# Patient Record
Sex: Male | Born: 1970
Health system: Southern US, Community
[De-identification: ages and names within clinical notes are randomized; demographics above are authoritative.]

## PROBLEM LIST (undated history)

## (undated) ENCOUNTER — Emergency Department (HOSPITAL_COMMUNITY): Admission: EM | Discharge status: Absent without leave | Payer: Self-pay

## (undated) DIAGNOSIS — J449 Chronic obstructive pulmonary disease, unspecified: Secondary | ICD-10-CM

## (undated) DIAGNOSIS — K760 Fatty (change of) liver, not elsewhere classified: Secondary | ICD-10-CM

## (undated) DIAGNOSIS — I5031 Acute diastolic (congestive) heart failure: Secondary | ICD-10-CM

## (undated) DIAGNOSIS — M109 Gout, unspecified: Secondary | ICD-10-CM

## (undated) DIAGNOSIS — E669 Obesity, unspecified: Secondary | ICD-10-CM

## (undated) DIAGNOSIS — I10 Essential (primary) hypertension: Secondary | ICD-10-CM

## (undated) DIAGNOSIS — D509 Iron deficiency anemia, unspecified: Secondary | ICD-10-CM

## (undated) DIAGNOSIS — E785 Hyperlipidemia, unspecified: Secondary | ICD-10-CM

## (undated) DIAGNOSIS — R0600 Dyspnea, unspecified: Secondary | ICD-10-CM

## (undated) DIAGNOSIS — N189 Chronic kidney disease, unspecified: Secondary | ICD-10-CM

## (undated) HISTORY — PX: VASCULAR SURGERY: SHX849

## (undated) HISTORY — PX: NO PAST SURGERIES: SHX2092

---

## 2001-11-28 ENCOUNTER — Emergency Department (HOSPITAL_COMMUNITY): Admission: EM | Admit: 2001-11-28 | Discharge: 2001-11-28 | Payer: Self-pay | Admitting: *Deleted

## 2002-09-18 ENCOUNTER — Encounter: Payer: Self-pay | Admitting: *Deleted

## 2002-09-18 ENCOUNTER — Emergency Department (HOSPITAL_COMMUNITY): Admission: EM | Admit: 2002-09-18 | Discharge: 2002-09-18 | Payer: Self-pay | Admitting: *Deleted

## 2004-04-19 ENCOUNTER — Emergency Department (HOSPITAL_COMMUNITY): Admission: EM | Admit: 2004-04-19 | Discharge: 2004-04-19 | Payer: Self-pay | Admitting: Emergency Medicine

## 2006-07-14 ENCOUNTER — Encounter (HOSPITAL_COMMUNITY): Admission: RE | Admit: 2006-07-14 | Discharge: 2006-07-25 | Payer: Self-pay | Admitting: Family Medicine

## 2007-01-14 ENCOUNTER — Ambulatory Visit: Payer: Self-pay | Admitting: Cardiology

## 2011-09-11 DIAGNOSIS — R0602 Shortness of breath: Secondary | ICD-10-CM

## 2012-06-07 DIAGNOSIS — R079 Chest pain, unspecified: Secondary | ICD-10-CM

## 2012-06-11 ENCOUNTER — Other Ambulatory Visit: Payer: Self-pay

## 2012-06-11 ENCOUNTER — Emergency Department (HOSPITAL_COMMUNITY): Payer: Medicaid Other

## 2012-06-11 ENCOUNTER — Inpatient Hospital Stay (HOSPITAL_COMMUNITY)
Admission: EM | Admit: 2012-06-11 | Discharge: 2012-06-12 | DRG: 292 | Disposition: A | Discharge status: Home / Self Care | Payer: Medicaid Other | Attending: Internal Medicine | Admitting: Internal Medicine

## 2012-06-11 ENCOUNTER — Encounter (HOSPITAL_COMMUNITY): Payer: Self-pay

## 2012-06-11 ENCOUNTER — Inpatient Hospital Stay (HOSPITAL_COMMUNITY): Payer: Medicaid Other

## 2012-06-11 DIAGNOSIS — R7989 Other specified abnormal findings of blood chemistry: Secondary | ICD-10-CM | POA: Diagnosis present

## 2012-06-11 DIAGNOSIS — E669 Obesity, unspecified: Secondary | ICD-10-CM | POA: Insufficient documentation

## 2012-06-11 DIAGNOSIS — R7401 Elevation of levels of liver transaminase levels: Secondary | ICD-10-CM | POA: Diagnosis present

## 2012-06-11 DIAGNOSIS — I5031 Acute diastolic (congestive) heart failure: Secondary | ICD-10-CM | POA: Diagnosis present

## 2012-06-11 DIAGNOSIS — E1122 Type 2 diabetes mellitus with diabetic chronic kidney disease: Secondary | ICD-10-CM | POA: Diagnosis present

## 2012-06-11 DIAGNOSIS — IMO0002 Reserved for concepts with insufficient information to code with codable children: Secondary | ICD-10-CM | POA: Diagnosis present

## 2012-06-11 DIAGNOSIS — R7402 Elevation of levels of lactic acid dehydrogenase (LDH): Secondary | ICD-10-CM | POA: Diagnosis present

## 2012-06-11 DIAGNOSIS — I509 Heart failure, unspecified: Secondary | ICD-10-CM | POA: Diagnosis present

## 2012-06-11 DIAGNOSIS — IMO0001 Reserved for inherently not codable concepts without codable children: Secondary | ICD-10-CM | POA: Diagnosis present

## 2012-06-11 DIAGNOSIS — K760 Fatty (change of) liver, not elsewhere classified: Secondary | ICD-10-CM | POA: Diagnosis present

## 2012-06-11 DIAGNOSIS — E119 Type 2 diabetes mellitus without complications: Secondary | ICD-10-CM | POA: Diagnosis present

## 2012-06-11 DIAGNOSIS — Z6841 Body Mass Index (BMI) 40.0 and over, adult: Secondary | ICD-10-CM

## 2012-06-11 DIAGNOSIS — K7689 Other specified diseases of liver: Secondary | ICD-10-CM | POA: Diagnosis present

## 2012-06-11 DIAGNOSIS — E871 Hypo-osmolality and hyponatremia: Secondary | ICD-10-CM | POA: Diagnosis present

## 2012-06-11 DIAGNOSIS — I10 Essential (primary) hypertension: Secondary | ICD-10-CM | POA: Diagnosis present

## 2012-06-11 DIAGNOSIS — I5033 Acute on chronic diastolic (congestive) heart failure: Principal | ICD-10-CM | POA: Diagnosis present

## 2012-06-11 DIAGNOSIS — D509 Iron deficiency anemia, unspecified: Secondary | ICD-10-CM | POA: Diagnosis present

## 2012-06-11 HISTORY — DX: Iron deficiency anemia, unspecified: D50.9

## 2012-06-11 HISTORY — DX: Obesity, unspecified: E66.9

## 2012-06-11 HISTORY — DX: Essential (primary) hypertension: I10

## 2012-06-11 HISTORY — DX: Hyperlipidemia, unspecified: E78.5

## 2012-06-11 HISTORY — DX: Fatty (change of) liver, not elsewhere classified: K76.0

## 2012-06-11 HISTORY — DX: Acute diastolic (congestive) heart failure: I50.31

## 2012-06-11 HISTORY — DX: Gout, unspecified: M10.9

## 2012-06-11 LAB — COMPREHENSIVE METABOLIC PANEL
ALT: 114 U/L — ABNORMAL HIGH (ref 0–53)
AST: 108 U/L — ABNORMAL HIGH (ref 0–37)
Albumin: 3.1 g/dL — ABNORMAL LOW (ref 3.5–5.2)
Alkaline Phosphatase: 196 U/L — ABNORMAL HIGH (ref 39–117)
BUN: 24 mg/dL — ABNORMAL HIGH (ref 6–23)
CO2: 28 mEq/L (ref 19–32)
Calcium: 9.7 mg/dL (ref 8.4–10.5)
Chloride: 94 mEq/L — ABNORMAL LOW (ref 96–112)
Creatinine, Ser: 1.31 mg/dL (ref 0.50–1.35)
GFR calc Af Amer: 76 mL/min — ABNORMAL LOW (ref >90)
GFR calc non Af Amer: 65 mL/min — ABNORMAL LOW (ref >90)
Glucose, Bld: 191 mg/dL — ABNORMAL HIGH (ref 70–99)
Potassium: 4.3 mEq/L (ref 3.5–5.1)
Sodium: 132 mEq/L — ABNORMAL LOW (ref 135–145)
Total Bilirubin: 0.3 mg/dL (ref 0.3–1.2)
Total Protein: 7.6 g/dL (ref 6.0–8.3)

## 2012-06-11 LAB — URINALYSIS, ROUTINE W REFLEX MICROSCOPIC
Bilirubin Urine: NEGATIVE
Glucose, UA: NEGATIVE mg/dL
Hgb urine dipstick: NEGATIVE
Ketones, ur: NEGATIVE mg/dL
Leukocytes, UA: NEGATIVE
Nitrite: NEGATIVE
Protein, ur: 100 mg/dL — AB
Specific Gravity, Urine: >1.03 — ABNORMAL HIGH (ref 1.005–1.030)
Urobilinogen, UA: 0.2 mg/dL (ref 0.0–1.0)
pH: 5.5 (ref 5.0–8.0)

## 2012-06-11 LAB — CBC
HCT: 35.2 % — ABNORMAL LOW (ref 39.0–52.0)
Hemoglobin: 11.3 g/dL — ABNORMAL LOW (ref 13.0–17.0)
MCH: 24.5 pg — ABNORMAL LOW (ref 26.0–34.0)
MCHC: 32.1 g/dL (ref 30.0–36.0)
MCV: 76.4 fL — ABNORMAL LOW (ref 78.0–100.0)
Platelets: 283 10*3/uL (ref 150–400)
RBC: 4.61 MIL/uL (ref 4.22–5.81)
RDW: 15.1 % (ref 11.5–15.5)
WBC: 8.2 10*3/uL (ref 4.0–10.5)

## 2012-06-11 LAB — CBC WITH DIFFERENTIAL/PLATELET
Basophils Absolute: 0 10*3/uL (ref 0.0–0.1)
Basophils Relative: 1 % (ref 0–1)
Eosinophils Absolute: 0.6 10*3/uL (ref 0.0–0.7)
Eosinophils Relative: 7 % — ABNORMAL HIGH (ref 0–5)
HCT: 35.2 % — ABNORMAL LOW (ref 39.0–52.0)
Hemoglobin: 11.3 g/dL — ABNORMAL LOW (ref 13.0–17.0)
Lymphocytes Relative: 35 % (ref 12–46)
Lymphs Abs: 2.9 10*3/uL (ref 0.7–4.0)
MCH: 24.4 pg — ABNORMAL LOW (ref 26.0–34.0)
MCHC: 32.1 g/dL (ref 30.0–36.0)
MCV: 76 fL — ABNORMAL LOW (ref 78.0–100.0)
Monocytes Absolute: 0.7 10*3/uL (ref 0.1–1.0)
Monocytes Relative: 9 % (ref 3–12)
Neutro Abs: 4 10*3/uL (ref 1.7–7.7)
Neutrophils Relative %: 49 % (ref 43–77)
Platelets: 291 10*3/uL (ref 150–400)
RBC: 4.63 MIL/uL (ref 4.22–5.81)
RDW: 15.2 % (ref 11.5–15.5)
WBC: 8.2 10*3/uL (ref 4.0–10.5)

## 2012-06-11 LAB — CARDIAC PANEL(CRET KIN+CKTOT+MB+TROPI)
CK, MB: 3.4 ng/mL (ref 0.3–4.0)
CK, MB: 3.5 ng/mL (ref 0.3–4.0)
CK, MB: 3.1 ng/mL (ref 0.3–4.0)
Relative Index: 0.7 (ref 0.0–2.5)
Relative Index: 0.8 (ref 0.0–2.5)
Relative Index: 0.8 (ref 0.0–2.5)
Total CK: 498 U/L — ABNORMAL HIGH (ref 7–232)
Total CK: 454 U/L — ABNORMAL HIGH (ref 7–232)
Total CK: 369 U/L — ABNORMAL HIGH (ref 7–232)
Troponin I: <0.3 ng/mL (ref <0.30)
Troponin I: <0.3 ng/mL (ref <0.30)
Troponin I: <0.3 ng/mL (ref <0.30)

## 2012-06-11 LAB — T4, FREE: Free T4: 1.35 ng/dL (ref 0.80–1.80)

## 2012-06-11 LAB — HEMOGLOBIN A1C
Hgb A1c MFr Bld: 11.5 % — ABNORMAL HIGH (ref <5.7)
Mean Plasma Glucose: 283 mg/dL — ABNORMAL HIGH (ref <117)

## 2012-06-11 LAB — URINE MICROSCOPIC-ADD ON

## 2012-06-11 LAB — TSH: TSH: 2.827 u[IU]/mL (ref 0.350–4.500)

## 2012-06-11 LAB — GLUCOSE, CAPILLARY
Glucose-Capillary: 263 mg/dL — ABNORMAL HIGH (ref 70–99)
Glucose-Capillary: 285 mg/dL — ABNORMAL HIGH (ref 70–99)
Glucose-Capillary: 375 mg/dL — ABNORMAL HIGH (ref 70–99)
Glucose-Capillary: 397 mg/dL — ABNORMAL HIGH (ref 70–99)
Glucose-Capillary: 456 mg/dL — ABNORMAL HIGH (ref 70–99)

## 2012-06-11 LAB — TROPONIN I: Troponin I: <0.3 ng/mL (ref <0.30)

## 2012-06-11 LAB — D-DIMER, QUANTITATIVE: D-Dimer, Quant: 1.14 ug/mL-FEU — ABNORMAL HIGH (ref 0.00–0.48)

## 2012-06-11 LAB — PRO B NATRIURETIC PEPTIDE: Pro B Natriuretic peptide (BNP): 1315 pg/mL — ABNORMAL HIGH (ref 0–125)

## 2012-06-11 MED ORDER — INSULIN GLARGINE 100 UNIT/ML ~~LOC~~ SOLN: 10.0000 [IU] | Freq: Every day | SUBCUTANEOUS | Status: DC

## 2012-06-11 MED ORDER — INSULIN GLARGINE 100 UNIT/ML ~~LOC~~ SOLN
20.0000 [IU] | Freq: Every day | SUBCUTANEOUS | Status: AC
  Administered 2012-06-11: 20 [IU] via SUBCUTANEOUS

## 2012-06-11 MED ORDER — CLONIDINE HCL 0.1 MG PO TABS
0.1000 mg | ORAL_TABLET | Freq: Three times a day (TID) | ORAL | Status: DC
  Administered 2012-06-11 – 2012-06-12 (×4): 0.1 mg via ORAL
  Filled 2012-06-11 (×4): qty 1

## 2012-06-11 MED ORDER — ENOXAPARIN SODIUM 40 MG/0.4ML ~~LOC~~ SOLN
40.0000 mg | SUBCUTANEOUS | Status: DC
  Administered 2012-06-11: 40 mg via SUBCUTANEOUS
  Filled 2012-06-11: qty 0.4

## 2012-06-11 MED ORDER — NITROGLYCERIN 2 % TD OINT
0.5000 [in_us] | TOPICAL_OINTMENT | Freq: Three times a day (TID) | TRANSDERMAL | Status: DC
  Administered 2012-06-11 – 2012-06-12 (×3): 0.5 [in_us] via TOPICAL
  Filled 2012-06-11 (×3): qty 1

## 2012-06-11 MED ORDER — SODIUM CHLORIDE 0.9 % IJ SOLN
3.0000 mL | Freq: Two times a day (BID) | INTRAMUSCULAR | Status: DC
  Administered 2012-06-11: 3 mL via INTRAVENOUS
  Filled 2012-06-11 (×2): qty 3

## 2012-06-11 MED ORDER — METHYLPREDNISOLONE SODIUM SUCC 125 MG IJ SOLR
125.0000 mg | Freq: Once | INTRAMUSCULAR | Status: AC
  Administered 2012-06-11: 125 mg via INTRAVENOUS
  Filled 2012-06-11: qty 2

## 2012-06-11 MED ORDER — SODIUM CHLORIDE 0.9 % IV SOLN: 250.0000 mL | INTRAVENOUS | Status: DC | PRN

## 2012-06-11 MED ORDER — INDOMETHACIN 25 MG PO CAPS
25.0000 mg | ORAL_CAPSULE | Freq: Two times a day (BID) | ORAL | Status: DC
  Administered 2012-06-11: 25 mg via ORAL
  Filled 2012-06-11: qty 1

## 2012-06-11 MED ORDER — ENOXAPARIN SODIUM 80 MG/0.8ML ~~LOC~~ SOLN
70.0000 mg | SUBCUTANEOUS | Status: DC
Start: 2012-06-12 — End: 2012-06-12
  Administered 2012-06-12: 70 mg via SUBCUTANEOUS
  Filled 2012-06-11: qty 0.8

## 2012-06-11 MED ORDER — FUROSEMIDE 10 MG/ML IJ SOLN
40.0000 mg | Freq: Every day | INTRAMUSCULAR | Status: DC
  Administered 2012-06-11: 40 mg via INTRAVENOUS
  Filled 2012-06-11 (×2): qty 4

## 2012-06-11 MED ORDER — NITROGLYCERIN 2 % TD OINT
1.0000 [in_us] | TOPICAL_OINTMENT | Freq: Three times a day (TID) | TRANSDERMAL | Status: DC
  Administered 2012-06-11: 1 [in_us] via TOPICAL
  Filled 2012-06-11: qty 1

## 2012-06-11 MED ORDER — ACETAMINOPHEN 325 MG PO TABS
650.0000 mg | ORAL_TABLET | ORAL | Status: DC | PRN
  Administered 2012-06-11 (×2): 650 mg via ORAL
  Filled 2012-06-11 (×2): qty 2

## 2012-06-11 MED ORDER — FUROSEMIDE 10 MG/ML IJ SOLN
60.0000 mg | INTRAMUSCULAR | Status: AC
  Administered 2012-06-11: 60 mg via INTRAVENOUS
  Filled 2012-06-11: qty 6

## 2012-06-11 MED ORDER — SODIUM CHLORIDE 0.9 % IJ SOLN: 3.0000 mL | INTRAMUSCULAR | Status: DC | PRN

## 2012-06-11 MED ORDER — INSULIN ASPART 100 UNIT/ML ~~LOC~~ SOLN
0.0000 [IU] | Freq: Every day | SUBCUTANEOUS | Status: DC
  Administered 2012-06-11: 5 [IU] via SUBCUTANEOUS

## 2012-06-11 MED ORDER — ONDANSETRON HCL 4 MG/2ML IJ SOLN: 4.0000 mg | Freq: Four times a day (QID) | INTRAMUSCULAR | Status: DC | PRN

## 2012-06-11 MED ORDER — ASPIRIN EC 81 MG PO TBEC
81.0000 mg | DELAYED_RELEASE_TABLET | Freq: Every day | ORAL | Status: DC
  Administered 2012-06-11 – 2012-06-12 (×2): 81 mg via ORAL
  Filled 2012-06-11 (×2): qty 1

## 2012-06-11 MED ORDER — IPRATROPIUM BROMIDE 0.02 % IN SOLN
0.5000 mg | Freq: Once | RESPIRATORY_TRACT | Status: AC
  Administered 2012-06-11: 0.5 mg via RESPIRATORY_TRACT
  Filled 2012-06-11: qty 2.5

## 2012-06-11 MED ORDER — INSULIN ASPART 100 UNIT/ML ~~LOC~~ SOLN
0.0000 [IU] | Freq: Three times a day (TID) | SUBCUTANEOUS | Status: DC
  Administered 2012-06-11: 11 [IU] via SUBCUTANEOUS
  Administered 2012-06-11: 20 [IU] via SUBCUTANEOUS
  Administered 2012-06-12: 11 [IU] via SUBCUTANEOUS
  Administered 2012-06-12: 15 [IU] via SUBCUTANEOUS

## 2012-06-11 MED ORDER — POTASSIUM CHLORIDE CRYS ER 20 MEQ PO TBCR
20.0000 meq | EXTENDED_RELEASE_TABLET | Freq: Two times a day (BID) | ORAL | Status: AC
  Administered 2012-06-11 (×2): 20 meq via ORAL
  Filled 2012-06-11 (×2): qty 1

## 2012-06-11 MED ORDER — INSULIN ASPART 100 UNIT/ML ~~LOC~~ SOLN
0.0000 [IU] | Freq: Three times a day (TID) | SUBCUTANEOUS | Status: DC
  Administered 2012-06-11: 5 [IU] via SUBCUTANEOUS

## 2012-06-11 MED ORDER — ALBUTEROL SULFATE (5 MG/ML) 0.5% IN NEBU
5.0000 mg | INHALATION_SOLUTION | Freq: Once | RESPIRATORY_TRACT | Status: AC
  Administered 2012-06-11: 5 mg via RESPIRATORY_TRACT
  Filled 2012-06-11: qty 1

## 2012-06-11 MED ORDER — ATENOLOL 25 MG PO TABS
50.0000 mg | ORAL_TABLET | Freq: Two times a day (BID) | ORAL | Status: DC
  Administered 2012-06-11 – 2012-06-12 (×3): 50 mg via ORAL
  Filled 2012-06-11 (×3): qty 2

## 2012-06-11 NOTE — ED Notes (Signed)
Pt was inpatient at [facility] for his breathing and his blood pressure and states he left because he felt they were not making him better.   Pt c/o sob and high blood pressure

## 2012-06-11 NOTE — H&P (Signed)
Chief Complaint:  sob  HPI: 41 yo AAM h/o htn, dm, hld, left [facility] yesterday AMA was there for "fluid in my lungs" and uncontrolled bp.  He left ama because he felt like they were not doing anything.  He denies any h/o being told he has heart failure.  Never had a heart cath.  Denies cp.  Since he left hosp yest he went home (without any meds) and got more sob overnight  With pnd and orthopnea.  bil le edema.  They did do u/s of his legs to r/o clots and he says was neg.  Says they did an echo of his heart which was normal per him. No cough, no fever no n/v/d.[name]  No h/o hepatitis, no h/o elev lft that he knows of .  Use to drink etoh regularly but quit about 4 mo ago.  Review of Systems:  O/w neg  Past Medical History: Past Medical History  Diagnosis Date  . Hypertension   . Diabetes mellitus    History reviewed. No pertinent past surgical history.  Medications: Prior to Admission medications   Medication Sig Start Date End Date Taking? Authorizing Provider  atenolol (TENORMIN) 50 MG tablet Take 50 mg by mouth 2 (two) times daily.   Yes Historical Provider, MD  cloNIDine (CATAPRES) 0.1 MG tablet Take 0.1 mg by mouth 3 (three) times daily.   Yes Historical Provider, MD  indomethacin (INDOCIN) 25 MG capsule Take 25 mg by mouth 2 (two) times daily with a meal.   Yes Historical Provider, MD  metFORMIN (GLUCOPHAGE) 1000 MG tablet Take 1,000 mg by mouth 2 (two) times daily with a meal.   Yes Historical Provider, MD    Allergies:   Allergies  Allergen Reactions  . Lisinopril Swelling    Social History:  reports that he has never smoked. He does not have any smokeless tobacco history on file. He reports that he does not drink alcohol or use illicit drugs.  Physical Exam: Filed Vitals:   06/11/12 0418 06/11/12 0443 06/11/12 0446  BP: 178/131    Temp: 98.5 F (36.9 C)    TempSrc: Oral    Resp: 24    Height: 6' (1.829 m)    Weight: 129.275 kg (285 lb)    SpO2: 94% 94% 94%    General appearance: alert, cooperative and no distress Neck: no JVD and supple, symmetrical, trachea midline Lungs: crackles b bases Heart: regular rate and rhythm, S1, S2 normal, no murmur, click, rub or gallop Abdomen: soft, non-tender; bowel sounds normal; no masses,  no organomegaly Extremities: edema 1 plus bila Pulses: 2+ and symmetric Skin: Skin color, texture, turgor normal. No rashes or lesions Neurologic: Grossly normal    Labs on Admission:   [facility] 06/11/12 0456  NA 132*  K 4.3  CL 94*  CO2 28  GLUCOSE 191*  BUN 24*  CREATININE 1.31  CALCIUM 9.7  MG --  PHOS --    Basename 06/11/12 0456  AST 108*  ALT 114*  ALKPHOS 196*  BILITOT 0.3  PROT 7.6  ALBUMIN 3.1*    Basename 06/11/12 0456  WBC 8.2  NEUTROABS 4.0  HGB 11.3*  HCT 35.2*  MCV 76.0*  PLT 291    Basename 06/11/12 0529  CKTOTAL --  CKMB --  CKMBINDEX --  TROPONINI <0.30   Radiological Exams on Admission: Dg Chest Port 1 View  06/11/2012  *RADIOLOGY REPORT*  Clinical Data: Short of breath.  PORTABLE CHEST - 1 VIEW  Comparison:  None.  Findings: Under penetrated exam.  Pulmonary vascular congestion and cardiomegaly.  Low volume chest. Monitoring leads are projected over the chest.  There may be some pulmonary edema at the bases. No gross focal consolidation.  IMPRESSION:  1.  Technically suboptimal exam due to underpenetration.  PA and lateral radiograph in the department may be useful when the patient can perform full inspiration. 2.  Cardiomegaly and pulmonary vascular congestion with low volume chest.  Question mild CHF.  Original Report Authenticated By: Dereck Ligas, M.D.    Assessment/Plan Present on Admission:  41 yo male with chf exac likely diastolic dysfxn from uncont htn .CHF exacerbation .HTN (hypertension) .Elevated LFTs .Obesity  Records to be obtained from [facility] so will not repeat echo as just done.  Place on lasix.  Home meds.  Ck hep panel and abd u/s.  Serial  cardiac enzymes.  Await other w/u until see what was just done at [facility].   Braya Habermehl A [id number] 06/11/2012, 5:54 AM

## 2012-06-11 NOTE — ED Notes (Signed)
Attempted to call report. Was advised nurse receiving pt will call this nurse back for report. 

## 2012-06-11 NOTE — Progress Notes (Signed)
Nutrition Education Note  RD consulted for nutrition education regarding a Heart Healthy diet.   Lipid Panel  No results found for this basename: chol, trig, hdl, cholhdl, vldl, ldlcalc    RD provided "Heart Healthy Nutrition Therapy" handout from the Academy of [employer] and Dietetics. Also provided plate method handout. Reviewed patient's dietary recall. Provided examples on ways to decrease sodium and fat intake in diet. Discouraged intake of processed foods and use of salt shaker. Encouraged fresh fruits and vegetables as well as whole grain sources of carbohydrates to maximize fiber intake.  Expect fair to good compliance.  Body mass index is 41.32 kg/(m^2). Pt meets criteria for extreme obesity (class III) based on current BMI.  Current diet order is NPO (for ultrasound), previously Heart Healthy, patient is consuming approximately 100% of meals at this time. Labs and medications reviewed. No further nutrition interventions warranted at this time. RD contact information provided. If additional nutrition issues arise, please re-consult RD.  Joaquim Lai, RD, LDN Pager: [phone]

## 2012-06-11 NOTE — Progress Notes (Signed)
Pt's D-Dimer 1.84.  Dr. Caryn Section notified.

## 2012-06-11 NOTE — ED Notes (Signed)
Respiratory paged

## 2012-06-11 NOTE — ED Provider Notes (Addendum)
History     CSN: [id number]  Arrival date & time 06/11/12  [id number]   First MD Initiated Contact with Patient 06/11/12 [phone]      Chief Complaint  Patient presents with  . Shortness of Breath  . Hypertension    (Consider location/radiation/quality/duration/timing/severity/associated sxs/prior treatment) HPI   Kelly Key is a 41 y.o. malewith a history of diabetes and hypertension  who presents to the Emergency Department complaining of shortness of breath and elevated blood pressure. He has been short of breath for a month, hospitalized x 2 at [facility] which he left AMA on Monday. While in the hospital he states he had "heart studies" including a 2D echo. He did not feel they were getting his breathing any better or his blood pressure better so he left. Has had increasing SOB since leaving. Tonight unable to lie down. Denies fever, chills, cough, nausea, vomiting, chest pain.   PCP Health Dept. Past Medical History  Diagnosis Date  . Hypertension   . Diabetes mellitus     History reviewed. No pertinent past surgical history.  No family history on file.  History  Substance Use Topics  . Smoking status: Never Smoker   . Smokeless tobacco: Not on file  . Alcohol Use: No      Review of Systems  Constitutional: Negative for fever.       10 Systems reviewed and are negative for acute change except as noted in the HPI.  HENT: Negative for congestion.   Eyes: Negative for discharge and redness.  Respiratory: Positive for shortness of breath. Negative for cough.   Cardiovascular: Negative for chest pain.  Gastrointestinal: Negative for vomiting and abdominal pain.  Musculoskeletal: Negative for back pain.  Skin: Negative for rash.  Neurological: Negative for syncope, numbness and headaches.  Psychiatric/Behavioral:       No behavior change.    Allergies  Lisinopril  Home Medications   Current Outpatient Rx  Name Route Sig Dispense Refill  . ATENOLOL 50 MG  PO TABS Oral Take 50 mg by mouth 2 (two) times daily.    [name] CLONIDINE HCL 0.1 MG PO TABS Oral Take 0.1 mg by mouth 3 (three) times daily.    . INDOMETHACIN 25 MG PO CAPS Oral Take 25 mg by mouth 2 (two) times daily with a meal.    . METFORMIN HCL 1000 MG PO TABS Oral Take 1,000 mg by mouth 2 (two) times daily with a meal.      BP 178/131  Temp 98.5 F (36.9 C) (Oral)  Resp 24  Ht 6' (1.829 m)  Wt 285 lb (129.275 kg)  BMI 38.65 kg/m2  SpO2 94%  Physical Exam  Nursing note and vitals reviewed. Constitutional: He is oriented to person, place, and time. He appears well-developed and well-nourished. He appears distressed.       Awake, alert, nontoxic appearance.  HENT:  Head: Normocephalic and atraumatic.  Right Ear: External ear normal.  Left Ear: External ear normal.  Mouth/Throat: Oropharynx is clear and moist.  Eyes: Conjunctivae and EOM are normal. Pupils are equal, round, and reactive to light. Right eye exhibits no discharge. Left eye exhibits no discharge.  Neck: Neck supple.  Pulmonary/Chest: He is in respiratory distress. He has wheezes. He exhibits no tenderness.       Increased work of breathing, Expiratory wheezes right side  Abdominal: Soft. There is no tenderness. There is no rebound.  Musculoskeletal: Normal range of motion. He exhibits no tenderness.  Baseline ROM, no obvious new focal weakness.  Neurological: He is alert and oriented to person, place, and time.       Mental status and motor strength appears baseline for patient and situation.  Skin: No rash noted.  Psychiatric: He has a normal mood and affect.    ED Course  Procedures (including critical care time)   Labs Reviewed  CBC WITH DIFFERENTIAL  COMPREHENSIVE METABOLIC PANEL  PRO B NATRIURETIC PEPTIDE  URINALYSIS, ROUTINE W REFLEX MICROSCOPIC     Date: 06/11/2012  0424  Rate: 86  Rhythm: normal sinus rhythm  QRS Axis: normal  Intervals: normal  ST/T Wave abnormalities: normal  Conduction  Disutrbances:none  Narrative Interpretation:   Old EKG Reviewed: none available Dg Chest Port 1 View  06/11/2012  *RADIOLOGY REPORT*  Clinical Data: Short of breath.  PORTABLE CHEST - 1 VIEW  Comparison: None.  Findings: Under penetrated exam.  Pulmonary vascular congestion and cardiomegaly.  Low volume chest. Monitoring leads are projected over the chest.  There may be some pulmonary edema at the bases. No gross focal consolidation.  IMPRESSION:  1.  Technically suboptimal exam due to underpenetration.  PA and lateral radiograph in the department may be useful when the patient can perform full inspiration. 2.  Cardiomegaly and pulmonary vascular congestion with low volume chest.  Question mild CHF.  Original Report Authenticated By: Dereck Ligas, M.D.  Dennet.Mood  T/C to Dr. Shanon Brow, hospitalist, case discussed, including:  HPI, pertinent PM/SHx, VS/PE, dx testing, ED course and treatment.  Agreeable toadmission  Requests to write temporary orders, telemetry bed to team 1   MDM  Patient with hypertension, diabetes, gout, here with shortness of breath. Chest xray with mild CHF, BNP elevated, elevated LFTs. Given albuterol/atrovent nebulizer, solumedrol, lasix, with improvement. Patient with 2 recent hospitalizations in last month at [facility]. Will arrange for admission. Spoke with Dr. Shanon Brow, hospitalist who will admit the patient. Pt stable in ED with no significant deterioration in condition.The patient appears reasonably stabilized for admission considering the current resources, flow, and capabilities available in the ED at this time, and I doubt any other [facility] requiring further screening and/or treatment in the ED prior to admission.  MDM Reviewed: nursing note and vitals Interpretation: labs, ECG and x-ray  Critical Care Time 40 minutes           Gypsy Balsam. Olin Hauser, MD 06/11/12 [id number]  Gypsy Balsam. Olin Hauser, MD 06/11/12 [phone]

## 2012-06-11 NOTE — Progress Notes (Signed)
The patient is a 41 year old man with a history of diabetes mellitus, hypertension, and apparently newly diagnosed congestive heart failure, who was admitted early this morning for chief complaint of shortness of breath. Per his history, he was hospitalized at [facility] nearly 2 weeks for uncontrolled hypertension and "fluid in my lungs." He was briefly seen. His laboratory data and vital signs were reviewed. Records from [facility] have been ordered and are currently pending. We'll continue current management. We'll check the results of the TSH, free T4, viral hepatitis panel, and abdominal ultrasound pending. We'll add potassium chloride supplementation as it is expected for his serum potassium  to decrease IV Lasix. We'll add Lantus and increase sliding scale NovoLog. Will check hemoglobin A1c. We'll check a d-dimer. If positive, we'll order a CT angiogram of the chest, but will await the records from [facility] before ordering any other diagnostic tests as stated previously by Dr. Shanon Brow.

## 2012-06-11 NOTE — Care Management Note (Unsigned)
    Page 1 of 1   06/11/2012     2:52:17 PM   CARE MANAGEMENT NOTE 06/11/2012  Patient:  Kelly Key, Kelly Key   Account Number:  [id number]  Date Initiated:  06/11/2012  Documentation initiated by:  Claretha Cooper  Subjective/Objective Assessment:   Pt admitted from home. Recently was in [facility] and left AMA. New Dx of CHF per pt.     Action/Plan:   CM asked RN to show pt the video on heart failure and the dietician to work with pt on diet. Since pt does not have a PCP, nor Insurance, [place] education may not be an option.   Anticipated DC Date:  06/14/2012   Anticipated DC Plan:  HOME/SELF CARE  In-house referral  Financial Counselor  Nutrition      DC Planning Services  CM consult      Choice offered to / List presented to:             Status of service:  In process, will continue to follow Medicare Important Message given?   (If response is "NO", the following Medicare IM given date fields will be blank) Date Medicare IM given:   Date Additional Medicare IM given:    Discharge Disposition:    Per UR Regulation:    If discussed at Long Length of Stay Meetings, dates discussed:    Comments:  06/11/12 [place]

## 2012-06-12 ENCOUNTER — Encounter (HOSPITAL_COMMUNITY): Payer: Self-pay | Admitting: Internal Medicine

## 2012-06-12 DIAGNOSIS — E1122 Type 2 diabetes mellitus with diabetic chronic kidney disease: Secondary | ICD-10-CM | POA: Diagnosis present

## 2012-06-12 DIAGNOSIS — D509 Iron deficiency anemia, unspecified: Secondary | ICD-10-CM | POA: Diagnosis present

## 2012-06-12 DIAGNOSIS — E119 Type 2 diabetes mellitus without complications: Secondary | ICD-10-CM | POA: Diagnosis present

## 2012-06-12 DIAGNOSIS — I5031 Acute diastolic (congestive) heart failure: Secondary | ICD-10-CM

## 2012-06-12 DIAGNOSIS — IMO0002 Reserved for concepts with insufficient information to code with codable children: Secondary | ICD-10-CM | POA: Diagnosis present

## 2012-06-12 DIAGNOSIS — E871 Hypo-osmolality and hyponatremia: Secondary | ICD-10-CM | POA: Diagnosis present

## 2012-06-12 HISTORY — DX: Iron deficiency anemia, unspecified: D50.9

## 2012-06-12 LAB — CBC
HCT: 35.1 % — ABNORMAL LOW (ref 39.0–52.0)
Hemoglobin: 11.3 g/dL — ABNORMAL LOW (ref 13.0–17.0)
MCH: 24.2 pg — ABNORMAL LOW (ref 26.0–34.0)
MCHC: 32.2 g/dL (ref 30.0–36.0)
MCV: 75.3 fL — ABNORMAL LOW (ref 78.0–100.0)
Platelets: 329 10*3/uL (ref 150–400)
RBC: 4.66 MIL/uL (ref 4.22–5.81)
RDW: 14.9 % (ref 11.5–15.5)
WBC: 11.4 10*3/uL — ABNORMAL HIGH (ref 4.0–10.5)

## 2012-06-12 LAB — BASIC METABOLIC PANEL
BUN: 27 mg/dL — ABNORMAL HIGH (ref 6–23)
CO2: 29 mEq/L (ref 19–32)
Calcium: 9.4 mg/dL (ref 8.4–10.5)
Chloride: 96 mEq/L (ref 96–112)
Creatinine, Ser: 1.18 mg/dL (ref 0.50–1.35)
GFR calc Af Amer: 86 mL/min — ABNORMAL LOW (ref >90)
GFR calc non Af Amer: 74 mL/min — ABNORMAL LOW (ref >90)
Glucose, Bld: 341 mg/dL — ABNORMAL HIGH (ref 70–99)
Potassium: 4.2 mEq/L (ref 3.5–5.1)
Sodium: 135 mEq/L (ref 135–145)

## 2012-06-12 LAB — HEPATIC FUNCTION PANEL
ALT: 76 U/L — ABNORMAL HIGH (ref 0–53)
AST: 21 U/L (ref 0–37)
Albumin: 2.8 g/dL — ABNORMAL LOW (ref 3.5–5.2)
Alkaline Phosphatase: 153 U/L — ABNORMAL HIGH (ref 39–117)
Bilirubin, Direct: <0.1 mg/dL (ref 0.0–0.3)
Total Bilirubin: 0.2 mg/dL — ABNORMAL LOW (ref 0.3–1.2)
Total Protein: 6.9 g/dL (ref 6.0–8.3)

## 2012-06-12 LAB — GLUCOSE, CAPILLARY
Glucose-Capillary: 297 mg/dL — ABNORMAL HIGH (ref 70–99)
Glucose-Capillary: 339 mg/dL — ABNORMAL HIGH (ref 70–99)

## 2012-06-12 MED ORDER — FUROSEMIDE 40 MG PO TABS: 40.0000 mg | ORAL_TABLET | Freq: Every day | ORAL | Status: DC

## 2012-06-12 MED ORDER — INSULIN NPH (HUMAN) (ISOPHANE) 100 UNIT/ML ~~LOC~~ SUSP: 12.0000 [IU] | Freq: Two times a day (BID) | SUBCUTANEOUS | Status: DC

## 2012-06-12 MED ORDER — FUROSEMIDE 40 MG PO TABS
40.0000 mg | ORAL_TABLET | Freq: Every day | ORAL | Status: DC
  Administered 2012-06-12: 40 mg via ORAL
  Filled 2012-06-12: qty 1

## 2012-06-12 NOTE — Discharge Summary (Signed)
Physician Discharge Summary  EDMOND GONNERING [id number] DOB: 1971/03/30 DOA: 06/11/2012  PCP: (Transitional) MD but will go to [facility] HD.  Admit date: 06/11/2012 Discharge date: 06/12/2012  Recommendations for Outpatient Follow-up:  1. The patient was discharged to home in improved and stable condition. He'll followup with a primary care provider at the [place] next week as recommended.  Discharge Diagnoses: 1. Acute on chronic diastolic congestive heart failure. (2-D echocardiogram on 06/07/2012 at [facility] revealed an ejection fraction of 50-55%, mild LVH, pseudonormalization of left ventricular diastolic filling pattern, and mild pulmonary hypertension). 2. Malignant hypertension. 3. Type 2 diabetes mellitus, uncontrolled. Hemoglobin A1c 11.5. 4. Obesity. 5. Elevated liver transaminases. 6. Hepatic steatosis. 7. Mild hyponatremia. 8. Mild microcytic anemia. Further outpatient evaluation is recommended.  Discharge Condition: Improved.  Diet recommendation: Heart healthy and carbohydrate modified.  Filed Weights   06/11/12 0418 06/11/12 0650 06/12/12 0500  Weight: 129.275 kg (285 lb) 138.2 kg (304 lb 10.8 oz) 134.446 kg (296 lb 6.4 oz)    History of present illness:  The patient is a 41 year old man with a history significant for diabetes mellitus and hypertension, who presented to the emergency department on 06/11/2012 with a chief complaint of shortness of breath. The patient left [facility] the day before, [place], because he felt that he was not improving. He was diagnosed with congestive heart failure, but he he did not know what type of heart failure. He also acknowledged that his blood pressure was too high and that the doctors there were trying to treat it. In the emergency department, he was afebrile and hemodynamically stable, though hypertensive with a blood pressure 178/131. He was oxygenating 94% on room air.  His lab data were significant for a pro BNP of 1315, hemoglobin of 11.3, normal troponin I., AST of 108, ALT of 114, and a glucose of 283. His chest x-ray revealed suboptimal study, but with cardiomegaly and questionable mild CHF. He was admitted for further evaluation and management.  Hospital Course:  The patient was given 60 mg of IV Lasix in the emergency department. This was followed by 40 mg of IV Lasix daily. Nitroglycerin paste was added for treatment of congestive heart failure and malignant hypertension. His chronic antihypertensive medications, atenolol and clonidine were continued. His diabetes was treated with sliding scale NovoLog and Lantus. Glipizide was held. Metformin was discontinued secondary to elevated liver enzymes. For further evaluation, a number of studies were ordered. His cardiac enzymes were consistent with a non-myocardial infarction. His hemoglobin A1c was quite elevated at 11.5, indicating poor outpatient control. Ultrasound of his abdomen revealed no gallstones but with hepatic steatosis. Acute viral hepatitis panel results were pending at the time of discharge. His TSH was within normal limits at 2.8 and his free T4 was within normal limits at 1.35. His d-dimer was elevated at 1.14.  Further investigational studies were postponed until records from [facility] were obtained. They were obtained and reviewed. Per the information gathered, the patient had a CT angiogram of his chest on 06/07/2012 that revealed no acute infiltrate, no pulmonary edema, and no central pulmonary embolus. Given the recent CT angiogram results, neither VQ scan or CT angiogram was ordered for evaluation of the elevated d-dimer. Also, the results of the 2-D echocardiogram performed on 06/07/2012 were reviewed. The results were significant for diastolic dysfunction and an ejection fraction of 50-55%. Also noted on the 2-D echocardiogram was mild pulmonary hypertension.    The patient improved  clinically and symptomatically. His liver transaminases decrease. He was noted to have microcytic anemia which will need to be evaluated further in the outpatient setting. He diuresed over 2 L. His weight was 138.2 kg on admission and 134.4 kg at the time of discharge. He was discharged on Lasix which apparently he had not been taking or prescribed  because left AMA from [facility]. He was advised to continue glipizide, but NPH insulin was prescribed prior to hospital discharge in an attempt to try to get his diabetes under better control. He was also encouraged to follow a heart healthy carbohydrate modified diet and to perform modest exercise to lose weight. He (and his wife) expressed understanding. He will followup with a healthcare provider at the health department for further management.    Procedures:  None  Consultations:  None  Discharge Exam: Filed Vitals:   06/12/12 1122  BP: 160/11  Pulse: 70  Temp:   Resp:    Filed Vitals:   06/12/12 0500 06/12/12 0548 06/12/12 0817 06/12/12 1122  BP:  157/91  160/11  Pulse:  72 88 70  Temp:  97.4 F (36.3 C)    TempSrc:  Oral    Resp:  20    Height:      Weight: 134.446 kg (296 lb 6.4 oz)     SpO2:  95% 99%     General: obese 41 year old African-American man sitting up in bed, in no acute distress. Cardiovascular: S1, S2, with no murmurs rubs or gallops. Respiratory: clear to auscultation bilaterally.  Discharge Instructions  Discharge Orders    Future Orders Please Complete By Expires   Diet - low sodium heart healthy      Increase activity slowly      Discharge instructions      Comments:   Follow a low fat low sugar diet daily. Weight loss will help to get your blood sugars and your blood pressure under better control. Check your blood sugars at home at least twice daily. Followup with your primary care physician next week.     Medication List  As of 06/12/2012  2:13 PM   STOP taking these medications          metFORMIN 1000 MG tablet         TAKE these medications         aspirin EC 81 MG tablet   Take 81 mg by mouth daily.      atenolol 50 MG tablet   Commonly known as: TENORMIN   Take 50 mg by mouth 2 (two) times daily.      cloNIDine 0.1 MG tablet   Commonly known as: CATAPRES   Take 0.2 mg by mouth every 8 (eight) hours.      furosemide 40 MG tablet   Commonly known as: LASIX   Take 1 tablet (40 mg total) by mouth daily. For treatment of congestive heart failure and fluid retention.      glipiZIDE 5 MG tablet   Commonly known as: GLUCOTROL   Take 10 mg by mouth 2 (two) times daily.      indomethacin 25 MG capsule   Commonly known as: INDOCIN   Take 25 mg by mouth 2 (two) times daily with a meal.      insulin NPH 100 UNIT/ML injection   Commonly known as: HUMULIN N,NOVOLIN N   Inject 12 Units into the skin 2 (two) times daily before a meal.  The results of significant diagnostics from this hospitalization (including imaging, microbiology, ancillary and laboratory) are listed below for reference.    Significant Diagnostic Studies: US Abdomen Complete  06/11/2012  *RADIOLOGY REPORT*  Clinical Data:  Elevated LFT  COMPLETE ABDOMINAL ULTRASOUND  Comparison:  None.  Findings:  Gallbladder:  No gallstones, gallbladder wall thickening, or pericholecystic fluid.  Common bile duct:  5.4 mm  Liver:  Liver is echogenic diffusely and difficult to image by ultrasound.  No focal mass lesion.  Liver length is 16 cm. Findings are compatible with fatty infiltration.  IVC:  Limited  Pancreas:  Limited  Spleen:  9.5 cm  Right Kidney:  11.0 cm.  Negative  Left Kidney:  13.2 cm.  Negative  Abdominal aorta:  No aneurysm identified.  IMPRESSION: Hepatic steatosis without focal mass.  Negative for gallstones.  Original Report Authenticated By: Truett Perna, M.D.   Dg Chest Port 1 View  06/11/2012  *RADIOLOGY REPORT*  Clinical Data: Short of breath.  PORTABLE CHEST - 1 VIEW   Comparison: None.  Findings: Under penetrated exam.  Pulmonary vascular congestion and cardiomegaly.  Low volume chest. Monitoring leads are projected over the chest.  There may be some pulmonary edema at the bases. No gross focal consolidation.  IMPRESSION:  1.  Technically suboptimal exam due to underpenetration.  PA and lateral radiograph in the department may be useful when the patient can perform full inspiration. 2.  Cardiomegaly and pulmonary vascular congestion with low volume chest.  Question mild CHF.  Original Report Authenticated By: Dereck Ligas, M.D.    Microbiology: No results found for this or any previous visit (from the past 240 hour(s)).   Labs: Basic Metabolic Panel:  Lab [id number] 06/11/12 0456  NA 135 132*  K 4.2 4.3  CL 96 94*  CO2 29 28  GLUCOSE 341* 191*  BUN 27* 24*  CREATININE 1.18 1.31  CALCIUM 9.4 9.7  MG -- --  PHOS -- --   Liver Function Tests:  Lab 06/12/12 0657 06/11/12 0456  AST 21 108*  ALT 76* 114*  ALKPHOS 153* 196*  BILITOT 0.2* 0.3  PROT 6.9 7.6  ALBUMIN 2.8* 3.1*   No results found for this basename: LIPASE:5,AMYLASE:5 in the last 168 hours No results found for this basename: AMMONIA:5 in the last 168 hours CBC:  Lab 06/12/12 0657 06/11/12 0630 06/11/12 0456  WBC 11.4* 8.2 8.2  NEUTROABS -- -- 4.0  HGB 11.3* 11.3* 11.3*  HCT 35.1* 35.2* 35.2*  MCV 75.3* 76.4* 76.0*  PLT 329 283 291   Cardiac Enzymes:  Lab 06/11/12 2118 06/11/12 1341 06/11/12 0530 06/11/12 0529  CKTOTAL 369* 454* 498* --  CKMB 3.1 3.5 3.4 --  CKMBINDEX -- -- -- --  TROPONINI <0.30 <0.30 <0.30 <0.30   BNP: BNP (last 3 results)  Basename 06/11/12 0456  PROBNP 1315.0*   CBG:  Lab 06/12/12 1116 06/12/12 0722 06/11/12 2337 06/11/12 2052 06/11/12 1704  GLUCAP 339* 297* 375* 456* 285*    Time coordinating discharge: less than 30 minutes  Signed:  Evyn Kooyman  [facility] 06/12/2012, 2:13 PM

## 2012-06-12 NOTE — Progress Notes (Signed)
Patient and significant other provided with discharge instructions.  Pt provided with education regarding:  Heart failure, DM and insulin.  Pt provided with prescriptions and voucher for [employer].  Pt provided with a set of scales with instruction provided regarding heart failure and daily weights.  Pt and significant other verbalized understanding of d/c instructions.  Pt's IV removed.  Pt tolerated well.  Pt stable at time of d/c.  Nurse transported pt via w/c to main entrance for discharge.

## 2012-06-14 LAB — HEPATITIS PANEL, ACUTE
HCV Ab: NEGATIVE
Hep A IgM: NEGATIVE
Hep B C IgM: NEGATIVE
Hepatitis B Surface Ag: NEGATIVE

## 2013-05-07 DIAGNOSIS — M109 Gout, unspecified: Secondary | ICD-10-CM | POA: Diagnosis not present

## 2013-05-07 DIAGNOSIS — I10 Essential (primary) hypertension: Secondary | ICD-10-CM | POA: Diagnosis not present

## 2013-10-03 DIAGNOSIS — IMO0001 Reserved for inherently not codable concepts without codable children: Secondary | ICD-10-CM | POA: Diagnosis not present

## 2013-10-03 DIAGNOSIS — I10 Essential (primary) hypertension: Secondary | ICD-10-CM | POA: Diagnosis not present

## 2013-10-05 DIAGNOSIS — IMO0001 Reserved for inherently not codable concepts without codable children: Secondary | ICD-10-CM | POA: Diagnosis not present

## 2013-10-05 DIAGNOSIS — I10 Essential (primary) hypertension: Secondary | ICD-10-CM | POA: Diagnosis not present

## 2013-11-09 DIAGNOSIS — H00029 Hordeolum internum unspecified eye, unspecified eyelid: Secondary | ICD-10-CM | POA: Diagnosis not present

## 2013-11-09 DIAGNOSIS — H5210 Myopia, unspecified eye: Secondary | ICD-10-CM | POA: Diagnosis not present

## 2013-11-17 DIAGNOSIS — H00029 Hordeolum internum unspecified eye, unspecified eyelid: Secondary | ICD-10-CM | POA: Diagnosis not present

## 2013-11-17 DIAGNOSIS — H5210 Myopia, unspecified eye: Secondary | ICD-10-CM | POA: Diagnosis not present

## 2014-02-14 DIAGNOSIS — I10 Essential (primary) hypertension: Secondary | ICD-10-CM | POA: Diagnosis not present

## 2014-02-14 DIAGNOSIS — IMO0001 Reserved for inherently not codable concepts without codable children: Secondary | ICD-10-CM | POA: Diagnosis not present

## 2014-06-02 DIAGNOSIS — Z833 Family history of diabetes mellitus: Secondary | ICD-10-CM | POA: Diagnosis not present

## 2014-06-02 DIAGNOSIS — Z8249 Family history of ischemic heart disease and other diseases of the circulatory system: Secondary | ICD-10-CM | POA: Diagnosis not present

## 2014-06-02 DIAGNOSIS — M1030 Gout due to renal impairment, unspecified site: Secondary | ICD-10-CM | POA: Diagnosis not present

## 2014-06-02 DIAGNOSIS — M109 Gout, unspecified: Secondary | ICD-10-CM | POA: Diagnosis not present

## 2014-06-02 DIAGNOSIS — E785 Hyperlipidemia, unspecified: Secondary | ICD-10-CM | POA: Diagnosis not present

## 2014-06-02 DIAGNOSIS — N289 Disorder of kidney and ureter, unspecified: Secondary | ICD-10-CM | POA: Diagnosis not present

## 2014-06-02 DIAGNOSIS — Z862 Personal history of diseases of the blood and blood-forming organs and certain disorders involving the immune mechanism: Secondary | ICD-10-CM | POA: Diagnosis not present

## 2014-06-02 DIAGNOSIS — I10 Essential (primary) hypertension: Secondary | ICD-10-CM | POA: Diagnosis not present

## 2014-06-02 DIAGNOSIS — E119 Type 2 diabetes mellitus without complications: Secondary | ICD-10-CM | POA: Diagnosis not present

## 2014-06-02 DIAGNOSIS — Z79899 Other long term (current) drug therapy: Secondary | ICD-10-CM | POA: Diagnosis not present

## 2014-06-02 DIAGNOSIS — Z794 Long term (current) use of insulin: Secondary | ICD-10-CM | POA: Diagnosis not present

## 2014-06-16 DIAGNOSIS — I10 Essential (primary) hypertension: Secondary | ICD-10-CM | POA: Diagnosis not present

## 2014-06-16 DIAGNOSIS — M109 Gout, unspecified: Secondary | ICD-10-CM | POA: Diagnosis not present

## 2014-06-16 DIAGNOSIS — IMO0001 Reserved for inherently not codable concepts without codable children: Secondary | ICD-10-CM | POA: Diagnosis not present

## 2014-06-21 DIAGNOSIS — I10 Essential (primary) hypertension: Secondary | ICD-10-CM | POA: Diagnosis not present

## 2014-06-21 DIAGNOSIS — IMO0001 Reserved for inherently not codable concepts without codable children: Secondary | ICD-10-CM | POA: Diagnosis not present

## 2014-09-07 DIAGNOSIS — E119 Type 2 diabetes mellitus without complications: Secondary | ICD-10-CM | POA: Diagnosis not present

## 2014-09-07 DIAGNOSIS — Z888 Allergy status to other drugs, medicaments and biological substances status: Secondary | ICD-10-CM | POA: Diagnosis not present

## 2014-09-07 DIAGNOSIS — Z833 Family history of diabetes mellitus: Secondary | ICD-10-CM | POA: Diagnosis not present

## 2014-09-07 DIAGNOSIS — Z8249 Family history of ischemic heart disease and other diseases of the circulatory system: Secondary | ICD-10-CM | POA: Diagnosis not present

## 2014-09-07 DIAGNOSIS — I10 Essential (primary) hypertension: Secondary | ICD-10-CM | POA: Diagnosis not present

## 2014-09-07 DIAGNOSIS — M10371 Gout due to renal impairment, right ankle and foot: Secondary | ICD-10-CM | POA: Diagnosis not present

## 2014-09-07 DIAGNOSIS — N289 Disorder of kidney and ureter, unspecified: Secondary | ICD-10-CM | POA: Diagnosis not present

## 2014-09-18 DIAGNOSIS — Z0000 Encounter for general adult medical examination without abnormal findings: Secondary | ICD-10-CM | POA: Diagnosis not present

## 2014-09-18 DIAGNOSIS — I10 Essential (primary) hypertension: Secondary | ICD-10-CM | POA: Diagnosis not present

## 2014-09-18 DIAGNOSIS — M109 Gout, unspecified: Secondary | ICD-10-CM | POA: Diagnosis not present

## 2014-09-18 DIAGNOSIS — E119 Type 2 diabetes mellitus without complications: Secondary | ICD-10-CM | POA: Diagnosis not present

## 2014-12-19 DIAGNOSIS — E119 Type 2 diabetes mellitus without complications: Secondary | ICD-10-CM | POA: Diagnosis not present

## 2014-12-19 DIAGNOSIS — I10 Essential (primary) hypertension: Secondary | ICD-10-CM | POA: Diagnosis not present

## 2014-12-20 DIAGNOSIS — E119 Type 2 diabetes mellitus without complications: Secondary | ICD-10-CM | POA: Diagnosis not present

## 2014-12-20 DIAGNOSIS — I10 Essential (primary) hypertension: Secondary | ICD-10-CM | POA: Diagnosis not present

## 2015-01-19 DIAGNOSIS — H52223 Regular astigmatism, bilateral: Secondary | ICD-10-CM | POA: Diagnosis not present

## 2015-01-19 DIAGNOSIS — H5202 Hypermetropia, left eye: Secondary | ICD-10-CM | POA: Diagnosis not present

## 2015-01-19 DIAGNOSIS — H524 Presbyopia: Secondary | ICD-10-CM | POA: Diagnosis not present

## 2015-01-19 DIAGNOSIS — E109 Type 1 diabetes mellitus without complications: Secondary | ICD-10-CM | POA: Diagnosis not present

## 2015-03-19 DIAGNOSIS — E119 Type 2 diabetes mellitus without complications: Secondary | ICD-10-CM | POA: Diagnosis not present

## 2015-03-19 DIAGNOSIS — I10 Essential (primary) hypertension: Secondary | ICD-10-CM | POA: Diagnosis not present

## 2015-03-29 DIAGNOSIS — M109 Gout, unspecified: Secondary | ICD-10-CM | POA: Diagnosis not present

## 2015-03-29 DIAGNOSIS — E119 Type 2 diabetes mellitus without complications: Secondary | ICD-10-CM | POA: Diagnosis not present

## 2015-03-29 DIAGNOSIS — I10 Essential (primary) hypertension: Secondary | ICD-10-CM | POA: Diagnosis not present

## 2015-03-29 DIAGNOSIS — E780 Pure hypercholesterolemia: Secondary | ICD-10-CM | POA: Diagnosis not present

## 2015-03-29 DIAGNOSIS — Z720 Tobacco use: Secondary | ICD-10-CM | POA: Diagnosis not present

## 2015-06-12 DIAGNOSIS — E119 Type 2 diabetes mellitus without complications: Secondary | ICD-10-CM | POA: Diagnosis not present

## 2015-06-12 DIAGNOSIS — L209 Atopic dermatitis, unspecified: Secondary | ICD-10-CM | POA: Diagnosis not present

## 2015-06-12 DIAGNOSIS — I10 Essential (primary) hypertension: Secondary | ICD-10-CM | POA: Diagnosis not present

## 2015-09-13 DIAGNOSIS — E1129 Type 2 diabetes mellitus with other diabetic kidney complication: Secondary | ICD-10-CM | POA: Diagnosis not present

## 2015-09-13 DIAGNOSIS — M1009 Idiopathic gout, multiple sites: Secondary | ICD-10-CM | POA: Diagnosis not present

## 2015-09-13 DIAGNOSIS — I10 Essential (primary) hypertension: Secondary | ICD-10-CM | POA: Diagnosis not present

## 2015-11-06 DIAGNOSIS — Z1389 Encounter for screening for other disorder: Secondary | ICD-10-CM | POA: Diagnosis not present

## 2015-11-06 DIAGNOSIS — J208 Acute bronchitis due to other specified organisms: Secondary | ICD-10-CM | POA: Diagnosis not present

## 2015-11-06 DIAGNOSIS — Z0000 Encounter for general adult medical examination without abnormal findings: Secondary | ICD-10-CM | POA: Diagnosis not present

## 2015-12-11 DIAGNOSIS — E119 Type 2 diabetes mellitus without complications: Secondary | ICD-10-CM | POA: Diagnosis not present

## 2015-12-11 DIAGNOSIS — Z79899 Other long term (current) drug therapy: Secondary | ICD-10-CM | POA: Diagnosis not present

## 2015-12-11 DIAGNOSIS — M109 Gout, unspecified: Secondary | ICD-10-CM | POA: Diagnosis not present

## 2015-12-11 DIAGNOSIS — J101 Influenza due to other identified influenza virus with other respiratory manifestations: Secondary | ICD-10-CM | POA: Diagnosis not present

## 2015-12-11 DIAGNOSIS — Z888 Allergy status to other drugs, medicaments and biological substances status: Secondary | ICD-10-CM | POA: Diagnosis not present

## 2015-12-11 DIAGNOSIS — E785 Hyperlipidemia, unspecified: Secondary | ICD-10-CM | POA: Diagnosis not present

## 2015-12-11 DIAGNOSIS — J1089 Influenza due to other identified influenza virus with other manifestations: Secondary | ICD-10-CM | POA: Diagnosis not present

## 2015-12-11 DIAGNOSIS — I509 Heart failure, unspecified: Secondary | ICD-10-CM | POA: Diagnosis not present

## 2015-12-11 DIAGNOSIS — I110 Hypertensive heart disease with heart failure: Secondary | ICD-10-CM | POA: Diagnosis not present

## 2016-01-28 DIAGNOSIS — I10 Essential (primary) hypertension: Secondary | ICD-10-CM | POA: Diagnosis not present

## 2016-01-28 DIAGNOSIS — E1122 Type 2 diabetes mellitus with diabetic chronic kidney disease: Secondary | ICD-10-CM | POA: Diagnosis not present

## 2016-04-09 DIAGNOSIS — E1122 Type 2 diabetes mellitus with diabetic chronic kidney disease: Secondary | ICD-10-CM | POA: Diagnosis not present

## 2016-04-09 DIAGNOSIS — I10 Essential (primary) hypertension: Secondary | ICD-10-CM | POA: Diagnosis not present

## 2016-04-28 DIAGNOSIS — M1009 Idiopathic gout, multiple sites: Secondary | ICD-10-CM | POA: Diagnosis not present

## 2016-04-28 DIAGNOSIS — N182 Chronic kidney disease, stage 2 (mild): Secondary | ICD-10-CM | POA: Diagnosis not present

## 2016-04-28 DIAGNOSIS — I10 Essential (primary) hypertension: Secondary | ICD-10-CM | POA: Diagnosis not present

## 2016-04-28 DIAGNOSIS — E1122 Type 2 diabetes mellitus with diabetic chronic kidney disease: Secondary | ICD-10-CM | POA: Diagnosis not present

## 2016-05-27 DIAGNOSIS — N182 Chronic kidney disease, stage 2 (mild): Secondary | ICD-10-CM | POA: Diagnosis not present

## 2016-05-27 DIAGNOSIS — M1009 Idiopathic gout, multiple sites: Secondary | ICD-10-CM | POA: Diagnosis not present

## 2016-05-27 DIAGNOSIS — I10 Essential (primary) hypertension: Secondary | ICD-10-CM | POA: Diagnosis not present

## 2016-05-27 DIAGNOSIS — E1122 Type 2 diabetes mellitus with diabetic chronic kidney disease: Secondary | ICD-10-CM | POA: Diagnosis not present

## 2016-06-17 DIAGNOSIS — E119 Type 2 diabetes mellitus without complications: Secondary | ICD-10-CM | POA: Diagnosis not present

## 2016-07-01 DIAGNOSIS — N182 Chronic kidney disease, stage 2 (mild): Secondary | ICD-10-CM | POA: Diagnosis not present

## 2016-07-01 DIAGNOSIS — Z79899 Other long term (current) drug therapy: Secondary | ICD-10-CM | POA: Diagnosis not present

## 2016-07-01 DIAGNOSIS — M1009 Idiopathic gout, multiple sites: Secondary | ICD-10-CM | POA: Diagnosis not present

## 2016-07-01 DIAGNOSIS — E785 Hyperlipidemia, unspecified: Secondary | ICD-10-CM | POA: Diagnosis not present

## 2016-07-01 DIAGNOSIS — E119 Type 2 diabetes mellitus without complications: Secondary | ICD-10-CM | POA: Diagnosis not present

## 2016-07-01 DIAGNOSIS — I509 Heart failure, unspecified: Secondary | ICD-10-CM | POA: Diagnosis not present

## 2016-07-01 DIAGNOSIS — Z8249 Family history of ischemic heart disease and other diseases of the circulatory system: Secondary | ICD-10-CM | POA: Diagnosis not present

## 2016-07-01 DIAGNOSIS — Z833 Family history of diabetes mellitus: Secondary | ICD-10-CM | POA: Diagnosis not present

## 2016-07-01 DIAGNOSIS — I10 Essential (primary) hypertension: Secondary | ICD-10-CM | POA: Diagnosis not present

## 2016-07-01 DIAGNOSIS — E1122 Type 2 diabetes mellitus with diabetic chronic kidney disease: Secondary | ICD-10-CM | POA: Diagnosis not present

## 2016-07-01 DIAGNOSIS — M109 Gout, unspecified: Secondary | ICD-10-CM | POA: Diagnosis not present

## 2016-07-01 DIAGNOSIS — I110 Hypertensive heart disease with heart failure: Secondary | ICD-10-CM | POA: Diagnosis not present

## 2016-07-29 DIAGNOSIS — N182 Chronic kidney disease, stage 2 (mild): Secondary | ICD-10-CM | POA: Diagnosis not present

## 2016-07-29 DIAGNOSIS — E1122 Type 2 diabetes mellitus with diabetic chronic kidney disease: Secondary | ICD-10-CM | POA: Diagnosis not present

## 2016-07-29 DIAGNOSIS — I10 Essential (primary) hypertension: Secondary | ICD-10-CM | POA: Diagnosis not present

## 2016-07-29 DIAGNOSIS — M1009 Idiopathic gout, multiple sites: Secondary | ICD-10-CM | POA: Diagnosis not present

## 2016-08-07 DIAGNOSIS — I10 Essential (primary) hypertension: Secondary | ICD-10-CM | POA: Diagnosis not present

## 2016-08-07 DIAGNOSIS — M1009 Idiopathic gout, multiple sites: Secondary | ICD-10-CM | POA: Diagnosis not present

## 2016-08-07 DIAGNOSIS — E1122 Type 2 diabetes mellitus with diabetic chronic kidney disease: Secondary | ICD-10-CM | POA: Diagnosis not present

## 2016-08-07 DIAGNOSIS — N182 Chronic kidney disease, stage 2 (mild): Secondary | ICD-10-CM | POA: Diagnosis not present

## 2016-10-05 DIAGNOSIS — I509 Heart failure, unspecified: Secondary | ICD-10-CM | POA: Diagnosis not present

## 2016-10-05 DIAGNOSIS — I110 Hypertensive heart disease with heart failure: Secondary | ICD-10-CM | POA: Diagnosis not present

## 2016-10-05 DIAGNOSIS — Z79899 Other long term (current) drug therapy: Secondary | ICD-10-CM | POA: Diagnosis not present

## 2016-10-05 DIAGNOSIS — E119 Type 2 diabetes mellitus without complications: Secondary | ICD-10-CM | POA: Diagnosis not present

## 2016-10-05 DIAGNOSIS — Z8249 Family history of ischemic heart disease and other diseases of the circulatory system: Secondary | ICD-10-CM | POA: Diagnosis not present

## 2016-10-05 DIAGNOSIS — M1711 Unilateral primary osteoarthritis, right knee: Secondary | ICD-10-CM | POA: Diagnosis not present

## 2016-10-05 DIAGNOSIS — E785 Hyperlipidemia, unspecified: Secondary | ICD-10-CM | POA: Diagnosis not present

## 2016-10-05 DIAGNOSIS — Z833 Family history of diabetes mellitus: Secondary | ICD-10-CM | POA: Diagnosis not present

## 2016-10-05 DIAGNOSIS — I10 Essential (primary) hypertension: Secondary | ICD-10-CM | POA: Diagnosis not present

## 2016-10-05 DIAGNOSIS — M109 Gout, unspecified: Secondary | ICD-10-CM | POA: Diagnosis not present

## 2016-10-05 DIAGNOSIS — M25461 Effusion, right knee: Secondary | ICD-10-CM | POA: Diagnosis not present

## 2016-10-17 DIAGNOSIS — E1122 Type 2 diabetes mellitus with diabetic chronic kidney disease: Secondary | ICD-10-CM | POA: Diagnosis not present

## 2016-10-17 DIAGNOSIS — N182 Chronic kidney disease, stage 2 (mild): Secondary | ICD-10-CM | POA: Diagnosis not present

## 2016-10-17 DIAGNOSIS — I10 Essential (primary) hypertension: Secondary | ICD-10-CM | POA: Diagnosis not present

## 2016-10-17 DIAGNOSIS — M1009 Idiopathic gout, multiple sites: Secondary | ICD-10-CM | POA: Diagnosis not present

## 2016-11-26 DIAGNOSIS — M25561 Pain in right knee: Secondary | ICD-10-CM | POA: Diagnosis not present

## 2016-11-26 DIAGNOSIS — Z79899 Other long term (current) drug therapy: Secondary | ICD-10-CM | POA: Diagnosis not present

## 2016-11-26 DIAGNOSIS — M109 Gout, unspecified: Secondary | ICD-10-CM | POA: Diagnosis not present

## 2016-11-26 DIAGNOSIS — Z8249 Family history of ischemic heart disease and other diseases of the circulatory system: Secondary | ICD-10-CM | POA: Diagnosis not present

## 2016-11-26 DIAGNOSIS — I509 Heart failure, unspecified: Secondary | ICD-10-CM | POA: Diagnosis not present

## 2016-11-26 DIAGNOSIS — I110 Hypertensive heart disease with heart failure: Secondary | ICD-10-CM | POA: Diagnosis not present

## 2016-11-26 DIAGNOSIS — M10061 Idiopathic gout, right knee: Secondary | ICD-10-CM | POA: Diagnosis not present

## 2016-11-26 DIAGNOSIS — E119 Type 2 diabetes mellitus without complications: Secondary | ICD-10-CM | POA: Diagnosis not present

## 2016-11-26 DIAGNOSIS — E7800 Pure hypercholesterolemia, unspecified: Secondary | ICD-10-CM | POA: Diagnosis not present

## 2016-11-26 DIAGNOSIS — Z833 Family history of diabetes mellitus: Secondary | ICD-10-CM | POA: Diagnosis not present

## 2016-11-26 DIAGNOSIS — Z8639 Personal history of other endocrine, nutritional and metabolic disease: Secondary | ICD-10-CM | POA: Diagnosis not present

## 2016-12-02 DIAGNOSIS — M1009 Idiopathic gout, multiple sites: Secondary | ICD-10-CM | POA: Diagnosis not present

## 2017-01-01 DIAGNOSIS — I509 Heart failure, unspecified: Secondary | ICD-10-CM | POA: Diagnosis not present

## 2017-01-01 DIAGNOSIS — E119 Type 2 diabetes mellitus without complications: Secondary | ICD-10-CM | POA: Diagnosis not present

## 2017-01-01 DIAGNOSIS — I110 Hypertensive heart disease with heart failure: Secondary | ICD-10-CM | POA: Diagnosis not present

## 2017-01-01 DIAGNOSIS — E785 Hyperlipidemia, unspecified: Secondary | ICD-10-CM | POA: Diagnosis not present

## 2017-01-01 DIAGNOSIS — Z794 Long term (current) use of insulin: Secondary | ICD-10-CM | POA: Diagnosis not present

## 2017-01-01 DIAGNOSIS — S161XXA Strain of muscle, fascia and tendon at neck level, initial encounter: Secondary | ICD-10-CM | POA: Diagnosis not present

## 2017-01-01 DIAGNOSIS — I10 Essential (primary) hypertension: Secondary | ICD-10-CM | POA: Diagnosis not present

## 2017-01-01 DIAGNOSIS — Z79899 Other long term (current) drug therapy: Secondary | ICD-10-CM | POA: Diagnosis not present

## 2017-01-01 DIAGNOSIS — R51 Headache: Secondary | ICD-10-CM | POA: Diagnosis not present

## 2017-01-02 ENCOUNTER — Ambulatory Visit (HOSPITAL_COMMUNITY)
Admission: RE | Admit: 2017-01-02 | Discharge: 2017-01-02 | Disposition: A | Discharge status: Home / Self Care | Payer: No Typology Code available for payment source | Source: Ambulatory Visit | Attending: Emergency Medicine | Admitting: Emergency Medicine

## 2017-01-02 ENCOUNTER — Other Ambulatory Visit (HOSPITAL_COMMUNITY): Payer: Self-pay | Admitting: Emergency Medicine

## 2017-01-02 ENCOUNTER — Encounter (INDEPENDENT_AMBULATORY_CARE_PROVIDER_SITE_OTHER): Payer: Self-pay

## 2017-01-02 DIAGNOSIS — Z743 Need for continuous supervision: Secondary | ICD-10-CM | POA: Diagnosis not present

## 2017-01-02 DIAGNOSIS — Z029 Encounter for administrative examinations, unspecified: Secondary | ICD-10-CM | POA: Insufficient documentation

## 2017-01-02 DIAGNOSIS — S0990XA Unspecified injury of head, initial encounter: Secondary | ICD-10-CM | POA: Diagnosis not present

## 2017-01-02 DIAGNOSIS — S199XXA Unspecified injury of neck, initial encounter: Secondary | ICD-10-CM | POA: Diagnosis not present

## 2017-01-02 DIAGNOSIS — R279 Unspecified lack of coordination: Secondary | ICD-10-CM | POA: Diagnosis not present

## 2017-02-05 DIAGNOSIS — N182 Chronic kidney disease, stage 2 (mild): Secondary | ICD-10-CM | POA: Diagnosis not present

## 2017-02-05 DIAGNOSIS — E1122 Type 2 diabetes mellitus with diabetic chronic kidney disease: Secondary | ICD-10-CM | POA: Diagnosis not present

## 2017-02-05 DIAGNOSIS — M1009 Idiopathic gout, multiple sites: Secondary | ICD-10-CM | POA: Diagnosis not present

## 2017-02-05 DIAGNOSIS — I10 Essential (primary) hypertension: Secondary | ICD-10-CM | POA: Diagnosis not present

## 2017-03-16 DIAGNOSIS — M1009 Idiopathic gout, multiple sites: Secondary | ICD-10-CM | POA: Diagnosis not present

## 2017-03-16 DIAGNOSIS — E1122 Type 2 diabetes mellitus with diabetic chronic kidney disease: Secondary | ICD-10-CM | POA: Diagnosis not present

## 2017-03-16 DIAGNOSIS — N182 Chronic kidney disease, stage 2 (mild): Secondary | ICD-10-CM | POA: Diagnosis not present

## 2017-03-16 DIAGNOSIS — I10 Essential (primary) hypertension: Secondary | ICD-10-CM | POA: Diagnosis not present

## 2017-05-04 DIAGNOSIS — N182 Chronic kidney disease, stage 2 (mild): Secondary | ICD-10-CM | POA: Diagnosis not present

## 2017-05-04 DIAGNOSIS — M1009 Idiopathic gout, multiple sites: Secondary | ICD-10-CM | POA: Diagnosis not present

## 2017-05-04 DIAGNOSIS — E1122 Type 2 diabetes mellitus with diabetic chronic kidney disease: Secondary | ICD-10-CM | POA: Diagnosis not present

## 2017-05-04 DIAGNOSIS — I10 Essential (primary) hypertension: Secondary | ICD-10-CM | POA: Diagnosis not present

## 2017-05-19 DIAGNOSIS — M4182 Other forms of scoliosis, cervical region: Secondary | ICD-10-CM | POA: Diagnosis not present

## 2017-05-19 DIAGNOSIS — R079 Chest pain, unspecified: Secondary | ICD-10-CM | POA: Diagnosis not present

## 2017-05-19 DIAGNOSIS — Z79899 Other long term (current) drug therapy: Secondary | ICD-10-CM | POA: Diagnosis not present

## 2017-05-19 DIAGNOSIS — M6282 Rhabdomyolysis: Secondary | ICD-10-CM | POA: Diagnosis not present

## 2017-05-19 DIAGNOSIS — I509 Heart failure, unspecified: Secondary | ICD-10-CM | POA: Diagnosis not present

## 2017-05-19 DIAGNOSIS — Z794 Long term (current) use of insulin: Secondary | ICD-10-CM | POA: Diagnosis not present

## 2017-05-19 DIAGNOSIS — N189 Chronic kidney disease, unspecified: Secondary | ICD-10-CM | POA: Diagnosis not present

## 2017-05-19 DIAGNOSIS — E785 Hyperlipidemia, unspecified: Secondary | ICD-10-CM | POA: Diagnosis not present

## 2017-05-19 DIAGNOSIS — E118 Type 2 diabetes mellitus with unspecified complications: Secondary | ICD-10-CM | POA: Diagnosis not present

## 2017-05-19 DIAGNOSIS — E1122 Type 2 diabetes mellitus with diabetic chronic kidney disease: Secondary | ICD-10-CM | POA: Diagnosis not present

## 2017-05-19 DIAGNOSIS — M79622 Pain in left upper arm: Secondary | ICD-10-CM | POA: Diagnosis not present

## 2017-05-19 DIAGNOSIS — Z8639 Personal history of other endocrine, nutritional and metabolic disease: Secondary | ICD-10-CM | POA: Diagnosis not present

## 2017-05-19 DIAGNOSIS — M79602 Pain in left arm: Secondary | ICD-10-CM | POA: Diagnosis not present

## 2017-05-19 DIAGNOSIS — M25512 Pain in left shoulder: Secondary | ICD-10-CM | POA: Diagnosis not present

## 2017-05-19 DIAGNOSIS — M25532 Pain in left wrist: Secondary | ICD-10-CM | POA: Diagnosis not present

## 2017-05-19 DIAGNOSIS — M542 Cervicalgia: Secondary | ICD-10-CM | POA: Diagnosis not present

## 2017-05-19 DIAGNOSIS — M792 Neuralgia and neuritis, unspecified: Secondary | ICD-10-CM | POA: Diagnosis not present

## 2017-05-19 DIAGNOSIS — I130 Hypertensive heart and chronic kidney disease with heart failure and stage 1 through stage 4 chronic kidney disease, or unspecified chronic kidney disease: Secondary | ICD-10-CM | POA: Diagnosis not present

## 2017-05-22 DIAGNOSIS — I10 Essential (primary) hypertension: Secondary | ICD-10-CM | POA: Diagnosis not present

## 2017-05-22 DIAGNOSIS — E784 Other hyperlipidemia: Secondary | ICD-10-CM | POA: Diagnosis not present

## 2017-05-22 DIAGNOSIS — N182 Chronic kidney disease, stage 2 (mild): Secondary | ICD-10-CM | POA: Diagnosis not present

## 2017-05-22 DIAGNOSIS — E1122 Type 2 diabetes mellitus with diabetic chronic kidney disease: Secondary | ICD-10-CM | POA: Diagnosis not present

## 2017-05-22 DIAGNOSIS — M1009 Idiopathic gout, multiple sites: Secondary | ICD-10-CM | POA: Diagnosis not present

## 2017-05-26 DIAGNOSIS — T560X1A Toxic effect of lead and its compounds, accidental (unintentional), initial encounter: Secondary | ICD-10-CM | POA: Diagnosis not present

## 2017-05-26 DIAGNOSIS — S83282D Other tear of lateral meniscus, current injury, left knee, subsequent encounter: Secondary | ICD-10-CM | POA: Diagnosis not present

## 2017-05-27 DIAGNOSIS — E784 Other hyperlipidemia: Secondary | ICD-10-CM | POA: Diagnosis not present

## 2017-05-27 DIAGNOSIS — I10 Essential (primary) hypertension: Secondary | ICD-10-CM | POA: Diagnosis not present

## 2017-05-27 DIAGNOSIS — E1122 Type 2 diabetes mellitus with diabetic chronic kidney disease: Secondary | ICD-10-CM | POA: Diagnosis not present

## 2017-06-12 DIAGNOSIS — E1122 Type 2 diabetes mellitus with diabetic chronic kidney disease: Secondary | ICD-10-CM | POA: Diagnosis not present

## 2017-06-12 DIAGNOSIS — M1009 Idiopathic gout, multiple sites: Secondary | ICD-10-CM | POA: Diagnosis not present

## 2017-06-12 DIAGNOSIS — I10 Essential (primary) hypertension: Secondary | ICD-10-CM | POA: Diagnosis not present

## 2017-06-12 DIAGNOSIS — N182 Chronic kidney disease, stage 2 (mild): Secondary | ICD-10-CM | POA: Diagnosis not present

## 2017-06-21 DIAGNOSIS — I110 Hypertensive heart disease with heart failure: Secondary | ICD-10-CM | POA: Diagnosis not present

## 2017-06-21 DIAGNOSIS — E119 Type 2 diabetes mellitus without complications: Secondary | ICD-10-CM | POA: Diagnosis not present

## 2017-06-21 DIAGNOSIS — Z79899 Other long term (current) drug therapy: Secondary | ICD-10-CM | POA: Diagnosis not present

## 2017-06-21 DIAGNOSIS — I509 Heart failure, unspecified: Secondary | ICD-10-CM | POA: Diagnosis not present

## 2017-06-21 DIAGNOSIS — X501XXA Overexertion from prolonged static or awkward postures, initial encounter: Secondary | ICD-10-CM | POA: Diagnosis not present

## 2017-06-21 DIAGNOSIS — S93402A Sprain of unspecified ligament of left ankle, initial encounter: Secondary | ICD-10-CM | POA: Diagnosis not present

## 2017-06-21 DIAGNOSIS — Z794 Long term (current) use of insulin: Secondary | ICD-10-CM | POA: Diagnosis not present

## 2017-06-21 DIAGNOSIS — M109 Gout, unspecified: Secondary | ICD-10-CM | POA: Diagnosis not present

## 2017-06-21 DIAGNOSIS — E785 Hyperlipidemia, unspecified: Secondary | ICD-10-CM | POA: Diagnosis not present

## 2017-06-26 DIAGNOSIS — M25572 Pain in left ankle and joints of left foot: Secondary | ICD-10-CM | POA: Diagnosis not present

## 2017-07-03 DIAGNOSIS — M25572 Pain in left ankle and joints of left foot: Secondary | ICD-10-CM | POA: Diagnosis not present

## 2017-07-03 DIAGNOSIS — I10 Essential (primary) hypertension: Secondary | ICD-10-CM | POA: Diagnosis not present

## 2017-09-25 DIAGNOSIS — K0889 Other specified disorders of teeth and supporting structures: Secondary | ICD-10-CM | POA: Diagnosis not present

## 2017-09-25 DIAGNOSIS — F17200 Nicotine dependence, unspecified, uncomplicated: Secondary | ICD-10-CM | POA: Diagnosis not present

## 2017-09-25 DIAGNOSIS — Z794 Long term (current) use of insulin: Secondary | ICD-10-CM | POA: Diagnosis not present

## 2017-09-25 DIAGNOSIS — E785 Hyperlipidemia, unspecified: Secondary | ICD-10-CM | POA: Diagnosis not present

## 2017-09-25 DIAGNOSIS — Z8639 Personal history of other endocrine, nutritional and metabolic disease: Secondary | ICD-10-CM | POA: Diagnosis not present

## 2017-09-25 DIAGNOSIS — I110 Hypertensive heart disease with heart failure: Secondary | ICD-10-CM | POA: Diagnosis not present

## 2017-09-25 DIAGNOSIS — E119 Type 2 diabetes mellitus without complications: Secondary | ICD-10-CM | POA: Diagnosis not present

## 2017-09-25 DIAGNOSIS — I509 Heart failure, unspecified: Secondary | ICD-10-CM | POA: Diagnosis not present

## 2017-09-25 DIAGNOSIS — M542 Cervicalgia: Secondary | ICD-10-CM | POA: Diagnosis not present

## 2017-09-25 DIAGNOSIS — Z833 Family history of diabetes mellitus: Secondary | ICD-10-CM | POA: Diagnosis not present

## 2017-09-25 DIAGNOSIS — Z8249 Family history of ischemic heart disease and other diseases of the circulatory system: Secondary | ICD-10-CM | POA: Diagnosis not present

## 2017-10-02 DIAGNOSIS — N182 Chronic kidney disease, stage 2 (mild): Secondary | ICD-10-CM | POA: Diagnosis not present

## 2017-10-02 DIAGNOSIS — I10 Essential (primary) hypertension: Secondary | ICD-10-CM | POA: Diagnosis not present

## 2017-10-02 DIAGNOSIS — E1122 Type 2 diabetes mellitus with diabetic chronic kidney disease: Secondary | ICD-10-CM | POA: Diagnosis not present

## 2017-10-02 DIAGNOSIS — M1009 Idiopathic gout, multiple sites: Secondary | ICD-10-CM | POA: Diagnosis not present

## 2017-10-02 DIAGNOSIS — E7849 Other hyperlipidemia: Secondary | ICD-10-CM | POA: Diagnosis not present

## 2017-10-14 DIAGNOSIS — N182 Chronic kidney disease, stage 2 (mild): Secondary | ICD-10-CM | POA: Diagnosis not present

## 2017-10-14 DIAGNOSIS — E7849 Other hyperlipidemia: Secondary | ICD-10-CM | POA: Diagnosis not present

## 2017-10-14 DIAGNOSIS — E1122 Type 2 diabetes mellitus with diabetic chronic kidney disease: Secondary | ICD-10-CM | POA: Diagnosis not present

## 2017-10-14 DIAGNOSIS — M1009 Idiopathic gout, multiple sites: Secondary | ICD-10-CM | POA: Diagnosis not present

## 2017-10-14 DIAGNOSIS — I10 Essential (primary) hypertension: Secondary | ICD-10-CM | POA: Diagnosis not present

## 2017-10-16 DIAGNOSIS — E7849 Other hyperlipidemia: Secondary | ICD-10-CM | POA: Diagnosis not present

## 2017-10-16 DIAGNOSIS — N182 Chronic kidney disease, stage 2 (mild): Secondary | ICD-10-CM | POA: Diagnosis not present

## 2017-10-16 DIAGNOSIS — E1122 Type 2 diabetes mellitus with diabetic chronic kidney disease: Secondary | ICD-10-CM | POA: Diagnosis not present

## 2017-10-16 DIAGNOSIS — I10 Essential (primary) hypertension: Secondary | ICD-10-CM | POA: Diagnosis not present

## 2017-12-29 DIAGNOSIS — I10 Essential (primary) hypertension: Secondary | ICD-10-CM | POA: Diagnosis not present

## 2017-12-29 DIAGNOSIS — N182 Chronic kidney disease, stage 2 (mild): Secondary | ICD-10-CM | POA: Diagnosis not present

## 2017-12-29 DIAGNOSIS — E7849 Other hyperlipidemia: Secondary | ICD-10-CM | POA: Diagnosis not present

## 2017-12-29 DIAGNOSIS — E1122 Type 2 diabetes mellitus with diabetic chronic kidney disease: Secondary | ICD-10-CM | POA: Diagnosis not present

## 2018-01-04 DIAGNOSIS — N182 Chronic kidney disease, stage 2 (mild): Secondary | ICD-10-CM | POA: Diagnosis not present

## 2018-01-04 DIAGNOSIS — E7849 Other hyperlipidemia: Secondary | ICD-10-CM | POA: Diagnosis not present

## 2018-01-04 DIAGNOSIS — I10 Essential (primary) hypertension: Secondary | ICD-10-CM | POA: Diagnosis not present

## 2018-01-04 DIAGNOSIS — M1009 Idiopathic gout, multiple sites: Secondary | ICD-10-CM | POA: Diagnosis not present

## 2018-01-04 DIAGNOSIS — E1122 Type 2 diabetes mellitus with diabetic chronic kidney disease: Secondary | ICD-10-CM | POA: Diagnosis not present

## 2018-01-08 DIAGNOSIS — N182 Chronic kidney disease, stage 2 (mild): Secondary | ICD-10-CM | POA: Diagnosis not present

## 2018-01-08 DIAGNOSIS — E7849 Other hyperlipidemia: Secondary | ICD-10-CM | POA: Diagnosis not present

## 2018-01-08 DIAGNOSIS — E1122 Type 2 diabetes mellitus with diabetic chronic kidney disease: Secondary | ICD-10-CM | POA: Diagnosis not present

## 2018-01-08 DIAGNOSIS — M1009 Idiopathic gout, multiple sites: Secondary | ICD-10-CM | POA: Diagnosis not present

## 2018-01-08 DIAGNOSIS — I10 Essential (primary) hypertension: Secondary | ICD-10-CM | POA: Diagnosis not present

## 2018-03-17 DIAGNOSIS — R0602 Shortness of breath: Secondary | ICD-10-CM | POA: Diagnosis not present

## 2018-03-17 DIAGNOSIS — I10 Essential (primary) hypertension: Secondary | ICD-10-CM | POA: Diagnosis not present

## 2018-03-17 DIAGNOSIS — I161 Hypertensive emergency: Secondary | ICD-10-CM | POA: Diagnosis not present

## 2018-03-17 DIAGNOSIS — M109 Gout, unspecified: Secondary | ICD-10-CM | POA: Diagnosis present

## 2018-03-17 DIAGNOSIS — R400 Somnolence: Secondary | ICD-10-CM | POA: Diagnosis not present

## 2018-03-17 DIAGNOSIS — I509 Heart failure, unspecified: Secondary | ICD-10-CM | POA: Diagnosis not present

## 2018-03-17 DIAGNOSIS — N170 Acute kidney failure with tubular necrosis: Secondary | ICD-10-CM | POA: Diagnosis not present

## 2018-03-17 DIAGNOSIS — I674 Hypertensive encephalopathy: Secondary | ICD-10-CM | POA: Diagnosis not present

## 2018-03-17 DIAGNOSIS — Z6841 Body Mass Index (BMI) 40.0 and over, adult: Secondary | ICD-10-CM | POA: Diagnosis not present

## 2018-03-17 DIAGNOSIS — D638 Anemia in other chronic diseases classified elsewhere: Secondary | ICD-10-CM | POA: Diagnosis present

## 2018-03-17 DIAGNOSIS — T402X5A Adverse effect of other opioids, initial encounter: Secondary | ICD-10-CM | POA: Diagnosis not present

## 2018-03-17 DIAGNOSIS — Z87891 Personal history of nicotine dependence: Secondary | ICD-10-CM | POA: Diagnosis not present

## 2018-03-17 DIAGNOSIS — R1110 Vomiting, unspecified: Secondary | ICD-10-CM | POA: Diagnosis not present

## 2018-03-17 DIAGNOSIS — I169 Hypertensive crisis, unspecified: Secondary | ICD-10-CM | POA: Diagnosis not present

## 2018-03-17 DIAGNOSIS — K5903 Drug induced constipation: Secondary | ICD-10-CM | POA: Diagnosis not present

## 2018-03-17 DIAGNOSIS — E785 Hyperlipidemia, unspecified: Secondary | ICD-10-CM | POA: Diagnosis present

## 2018-03-17 DIAGNOSIS — E1140 Type 2 diabetes mellitus with diabetic neuropathy, unspecified: Secondary | ICD-10-CM | POA: Diagnosis not present

## 2018-03-17 DIAGNOSIS — Z794 Long term (current) use of insulin: Secondary | ICD-10-CM | POA: Diagnosis not present

## 2018-03-17 DIAGNOSIS — G8929 Other chronic pain: Secondary | ICD-10-CM | POA: Diagnosis present

## 2018-03-17 DIAGNOSIS — R51 Headache: Secondary | ICD-10-CM | POA: Diagnosis not present

## 2018-03-17 DIAGNOSIS — Z9119 Patient's noncompliance with other medical treatment and regimen: Secondary | ICD-10-CM | POA: Diagnosis not present

## 2018-03-24 DIAGNOSIS — E7849 Other hyperlipidemia: Secondary | ICD-10-CM | POA: Diagnosis not present

## 2018-03-24 DIAGNOSIS — I10 Essential (primary) hypertension: Secondary | ICD-10-CM | POA: Diagnosis not present

## 2018-03-24 DIAGNOSIS — E1122 Type 2 diabetes mellitus with diabetic chronic kidney disease: Secondary | ICD-10-CM | POA: Diagnosis not present

## 2018-03-24 DIAGNOSIS — Z1389 Encounter for screening for other disorder: Secondary | ICD-10-CM | POA: Diagnosis not present

## 2018-03-24 DIAGNOSIS — N182 Chronic kidney disease, stage 2 (mild): Secondary | ICD-10-CM | POA: Diagnosis not present

## 2018-03-24 DIAGNOSIS — M1009 Idiopathic gout, multiple sites: Secondary | ICD-10-CM | POA: Diagnosis not present

## 2018-03-24 DIAGNOSIS — Z0000 Encounter for general adult medical examination without abnormal findings: Secondary | ICD-10-CM | POA: Diagnosis not present

## 2018-03-24 DIAGNOSIS — N183 Chronic kidney disease, stage 3 (moderate): Secondary | ICD-10-CM | POA: Diagnosis not present

## 2018-03-25 DIAGNOSIS — I509 Heart failure, unspecified: Secondary | ICD-10-CM | POA: Diagnosis not present

## 2018-03-25 DIAGNOSIS — I110 Hypertensive heart disease with heart failure: Secondary | ICD-10-CM | POA: Diagnosis not present

## 2018-03-25 DIAGNOSIS — E785 Hyperlipidemia, unspecified: Secondary | ICD-10-CM | POA: Diagnosis not present

## 2018-03-25 DIAGNOSIS — Z79899 Other long term (current) drug therapy: Secondary | ICD-10-CM | POA: Diagnosis not present

## 2018-03-25 DIAGNOSIS — R0602 Shortness of breath: Secondary | ICD-10-CM | POA: Diagnosis not present

## 2018-03-25 DIAGNOSIS — I10 Essential (primary) hypertension: Secondary | ICD-10-CM | POA: Diagnosis not present

## 2018-03-25 DIAGNOSIS — R2243 Localized swelling, mass and lump, lower limb, bilateral: Secondary | ICD-10-CM | POA: Diagnosis not present

## 2018-03-25 DIAGNOSIS — R0600 Dyspnea, unspecified: Secondary | ICD-10-CM | POA: Diagnosis not present

## 2018-03-25 DIAGNOSIS — E119 Type 2 diabetes mellitus without complications: Secondary | ICD-10-CM | POA: Diagnosis not present

## 2018-03-25 DIAGNOSIS — Z87891 Personal history of nicotine dependence: Secondary | ICD-10-CM | POA: Diagnosis not present

## 2018-03-26 ENCOUNTER — Encounter (HOSPITAL_COMMUNITY): Payer: Self-pay | Admitting: Emergency Medicine

## 2018-03-26 ENCOUNTER — Inpatient Hospital Stay (HOSPITAL_COMMUNITY)
Admission: EM | Admit: 2018-03-26 | Discharge: 2018-04-04 | DRG: 291 | Disposition: A | Discharge status: Home / Self Care | Payer: Medicare Other | Attending: Internal Medicine | Admitting: Internal Medicine

## 2018-03-26 ENCOUNTER — Emergency Department (HOSPITAL_COMMUNITY): Payer: Medicare Other

## 2018-03-26 ENCOUNTER — Other Ambulatory Visit: Payer: Self-pay

## 2018-03-26 DIAGNOSIS — E119 Type 2 diabetes mellitus without complications: Secondary | ICD-10-CM

## 2018-03-26 DIAGNOSIS — I5021 Acute systolic (congestive) heart failure: Secondary | ICD-10-CM | POA: Diagnosis not present

## 2018-03-26 DIAGNOSIS — I10 Essential (primary) hypertension: Secondary | ICD-10-CM

## 2018-03-26 DIAGNOSIS — D631 Anemia in chronic kidney disease: Secondary | ICD-10-CM | POA: Diagnosis present

## 2018-03-26 DIAGNOSIS — Z6841 Body Mass Index (BMI) 40.0 and over, adult: Secondary | ICD-10-CM

## 2018-03-26 DIAGNOSIS — R609 Edema, unspecified: Secondary | ICD-10-CM

## 2018-03-26 DIAGNOSIS — I130 Hypertensive heart and chronic kidney disease with heart failure and stage 1 through stage 4 chronic kidney disease, or unspecified chronic kidney disease: Secondary | ICD-10-CM | POA: Diagnosis not present

## 2018-03-26 DIAGNOSIS — E1122 Type 2 diabetes mellitus with diabetic chronic kidney disease: Secondary | ICD-10-CM | POA: Diagnosis present

## 2018-03-26 DIAGNOSIS — M109 Gout, unspecified: Secondary | ICD-10-CM | POA: Diagnosis present

## 2018-03-26 DIAGNOSIS — E785 Hyperlipidemia, unspecified: Secondary | ICD-10-CM | POA: Diagnosis present

## 2018-03-26 DIAGNOSIS — K5900 Constipation, unspecified: Secondary | ICD-10-CM | POA: Diagnosis not present

## 2018-03-26 DIAGNOSIS — I5043 Acute on chronic combined systolic (congestive) and diastolic (congestive) heart failure: Secondary | ICD-10-CM | POA: Diagnosis not present

## 2018-03-26 DIAGNOSIS — I5033 Acute on chronic diastolic (congestive) heart failure: Secondary | ICD-10-CM | POA: Diagnosis not present

## 2018-03-26 DIAGNOSIS — I509 Heart failure, unspecified: Secondary | ICD-10-CM

## 2018-03-26 DIAGNOSIS — J9601 Acute respiratory failure with hypoxia: Secondary | ICD-10-CM | POA: Diagnosis present

## 2018-03-26 DIAGNOSIS — N189 Chronic kidney disease, unspecified: Secondary | ICD-10-CM

## 2018-03-26 DIAGNOSIS — Z794 Long term (current) use of insulin: Secondary | ICD-10-CM

## 2018-03-26 DIAGNOSIS — Z888 Allergy status to other drugs, medicaments and biological substances status: Secondary | ICD-10-CM

## 2018-03-26 DIAGNOSIS — IMO0002 Reserved for concepts with insufficient information to code with codable children: Secondary | ICD-10-CM

## 2018-03-26 DIAGNOSIS — N183 Chronic kidney disease, stage 3 (moderate): Secondary | ICD-10-CM

## 2018-03-26 DIAGNOSIS — N179 Acute kidney failure, unspecified: Secondary | ICD-10-CM | POA: Diagnosis not present

## 2018-03-26 DIAGNOSIS — Z79899 Other long term (current) drug therapy: Secondary | ICD-10-CM

## 2018-03-26 DIAGNOSIS — N049 Nephrotic syndrome with unspecified morphologic changes: Secondary | ICD-10-CM | POA: Diagnosis present

## 2018-03-26 DIAGNOSIS — R600 Localized edema: Secondary | ICD-10-CM | POA: Diagnosis not present

## 2018-03-26 DIAGNOSIS — R0600 Dyspnea, unspecified: Secondary | ICD-10-CM

## 2018-03-26 DIAGNOSIS — E669 Obesity, unspecified: Secondary | ICD-10-CM | POA: Diagnosis present

## 2018-03-26 DIAGNOSIS — R0602 Shortness of breath: Secondary | ICD-10-CM

## 2018-03-26 DIAGNOSIS — I110 Hypertensive heart disease with heart failure: Secondary | ICD-10-CM | POA: Diagnosis not present

## 2018-03-26 LAB — COMPREHENSIVE METABOLIC PANEL
ALT: 26 U/L (ref 17–63)
AST: 17 U/L (ref 15–41)
Albumin: 3 g/dL — ABNORMAL LOW (ref 3.5–5.0)
Alkaline Phosphatase: 85 U/L (ref 38–126)
Anion gap: 10 (ref 5–15)
BUN: 43 mg/dL — ABNORMAL HIGH (ref 6–20)
CO2: 26 mmol/L (ref 22–32)
Calcium: 9 mg/dL (ref 8.9–10.3)
Chloride: 99 mmol/L — ABNORMAL LOW (ref 101–111)
Creatinine, Ser: 2.24 mg/dL — ABNORMAL HIGH (ref 0.61–1.24)
GFR calc Af Amer: 38 mL/min — ABNORMAL LOW (ref >60)
GFR calc non Af Amer: 33 mL/min — ABNORMAL LOW (ref >60)
Glucose, Bld: 183 mg/dL — ABNORMAL HIGH (ref 65–99)
Potassium: 4.2 mmol/L (ref 3.5–5.1)
Sodium: 135 mmol/L (ref 135–145)
Total Bilirubin: 0.6 mg/dL (ref 0.3–1.2)
Total Protein: 7.6 g/dL (ref 6.5–8.1)

## 2018-03-26 LAB — CBC WITH DIFFERENTIAL/PLATELET
Basophils Absolute: 0 10*3/uL (ref 0.0–0.1)
Basophils Relative: 0 %
Eosinophils Absolute: 0.6 10*3/uL (ref 0.0–0.7)
Eosinophils Relative: 5 %
HCT: 30.7 % — ABNORMAL LOW (ref 39.0–52.0)
Hemoglobin: 9.7 g/dL — ABNORMAL LOW (ref 13.0–17.0)
Lymphocytes Relative: 18 %
Lymphs Abs: 1.9 10*3/uL (ref 0.7–4.0)
MCH: 24.1 pg — ABNORMAL LOW (ref 26.0–34.0)
MCHC: 31.6 g/dL (ref 30.0–36.0)
MCV: 76.4 fL — ABNORMAL LOW (ref 78.0–100.0)
Monocytes Absolute: 0.9 10*3/uL (ref 0.1–1.0)
Monocytes Relative: 9 %
Neutro Abs: 7.3 10*3/uL (ref 1.7–7.7)
Neutrophils Relative %: 68 %
Platelets: 411 10*3/uL — ABNORMAL HIGH (ref 150–400)
RBC: 4.02 MIL/uL — ABNORMAL LOW (ref 4.22–5.81)
RDW: 15.2 % (ref 11.5–15.5)
WBC: 10.7 10*3/uL — ABNORMAL HIGH (ref 4.0–10.5)

## 2018-03-26 LAB — GLUCOSE, CAPILLARY
Glucose-Capillary: 209 mg/dL — ABNORMAL HIGH (ref 65–99)
Glucose-Capillary: 207 mg/dL — ABNORMAL HIGH (ref 65–99)

## 2018-03-26 LAB — BRAIN NATRIURETIC PEPTIDE: B Natriuretic Peptide: 317 pg/mL — ABNORMAL HIGH (ref 0.0–100.0)

## 2018-03-26 LAB — TROPONIN I
Troponin I: 0.03 ng/mL (ref <0.03)
Troponin I: 0.03 ng/mL (ref <0.03)

## 2018-03-26 MED ORDER — FUROSEMIDE 10 MG/ML IJ SOLN
60.0000 mg | Freq: Two times a day (BID) | INTRAMUSCULAR | Status: DC
  Administered 2018-03-27 – 2018-03-29 (×5): 60 mg via INTRAVENOUS
  Filled 2018-03-26 (×5): qty 6

## 2018-03-26 MED ORDER — FUROSEMIDE 10 MG/ML IJ SOLN: 60.0000 mg | Freq: Two times a day (BID) | INTRAMUSCULAR | Status: DC

## 2018-03-26 MED ORDER — ACETAMINOPHEN 325 MG PO TABS
650.0000 mg | ORAL_TABLET | ORAL | Status: DC | PRN
  Administered 2018-03-27 – 2018-04-03 (×5): 650 mg via ORAL
  Filled 2018-03-26 (×5): qty 2

## 2018-03-26 MED ORDER — SODIUM CHLORIDE 0.9 % IV SOLN: 250.0000 mL | INTRAVENOUS | Status: DC | PRN

## 2018-03-26 MED ORDER — LABETALOL HCL 200 MG PO TABS
400.0000 mg | ORAL_TABLET | Freq: Two times a day (BID) | ORAL | Status: DC
  Administered 2018-03-26 – 2018-03-29 (×7): 400 mg via ORAL
  Filled 2018-03-26 (×8): qty 2

## 2018-03-26 MED ORDER — CLONIDINE HCL 0.1 MG PO TABS
0.1000 mg | ORAL_TABLET | Freq: Once | ORAL | Status: AC
  Administered 2018-03-26: 0.1 mg via ORAL
  Filled 2018-03-26: qty 1

## 2018-03-26 MED ORDER — POLYETHYLENE GLYCOL 3350 17 G PO PACK
17.0000 g | PACK | Freq: Every day | ORAL | Status: DC
  Administered 2018-03-26 – 2018-04-04 (×5): 17 g via ORAL
  Filled 2018-03-26 (×10): qty 1

## 2018-03-26 MED ORDER — ALBUTEROL SULFATE (2.5 MG/3ML) 0.083% IN NEBU
2.5000 mg | INHALATION_SOLUTION | RESPIRATORY_TRACT | Status: DC | PRN
Start: 2018-03-26 — End: 2018-04-04
  Administered 2018-03-30 – 2018-04-01 (×3): 2.5 mg via RESPIRATORY_TRACT
  Filled 2018-03-26 (×4): qty 3

## 2018-03-26 MED ORDER — FUROSEMIDE 10 MG/ML IJ SOLN
40.0000 mg | Freq: Once | INTRAMUSCULAR | Status: AC
  Administered 2018-03-26: 40 mg via INTRAVENOUS
  Filled 2018-03-26: qty 4

## 2018-03-26 MED ORDER — ATENOLOL 25 MG PO TABS
50.0000 mg | ORAL_TABLET | Freq: Once | ORAL | Status: AC
  Administered 2018-03-26: 50 mg via ORAL
  Filled 2018-03-26: qty 2

## 2018-03-26 MED ORDER — BISACODYL 5 MG PO TBEC
10.0000 mg | DELAYED_RELEASE_TABLET | Freq: Once | ORAL | Status: AC
  Administered 2018-03-26: 10 mg via ORAL
  Filled 2018-03-26: qty 2

## 2018-03-26 MED ORDER — ATORVASTATIN CALCIUM 40 MG PO TABS
80.0000 mg | ORAL_TABLET | Freq: Every day | ORAL | Status: DC
  Administered 2018-03-26 – 2018-04-04 (×10): 80 mg via ORAL
  Filled 2018-03-26 (×11): qty 2

## 2018-03-26 MED ORDER — SODIUM BICARBONATE 650 MG PO TABS
650.0000 mg | ORAL_TABLET | Freq: Two times a day (BID) | ORAL | Status: DC
  Administered 2018-03-26 – 2018-04-04 (×18): 650 mg via ORAL
  Filled 2018-03-26 (×18): qty 1

## 2018-03-26 MED ORDER — INSULIN DETEMIR 100 UNIT/ML ~~LOC~~ SOLN
10.0000 [IU] | Freq: Every day | SUBCUTANEOUS | Status: DC
  Administered 2018-03-26 – 2018-04-03 (×9): 10 [IU] via SUBCUTANEOUS
  Filled 2018-03-26 (×10): qty 0.1

## 2018-03-26 MED ORDER — INSULIN ASPART 100 UNIT/ML ~~LOC~~ SOLN
0.0000 [IU] | Freq: Every day | SUBCUTANEOUS | Status: DC
  Administered 2018-03-26: 3 [IU] via SUBCUTANEOUS
  Administered 2018-03-30: 2 [IU] via SUBCUTANEOUS
  Administered 2018-04-01: 3 [IU] via SUBCUTANEOUS

## 2018-03-26 MED ORDER — ALBUTEROL SULFATE (2.5 MG/3ML) 0.083% IN NEBU
INHALATION_SOLUTION | RESPIRATORY_TRACT | Status: AC
  Administered 2018-03-26: 2.5 mg
  Filled 2018-03-26: qty 3

## 2018-03-26 MED ORDER — HYDRALAZINE HCL 25 MG PO TABS
50.0000 mg | ORAL_TABLET | Freq: Three times a day (TID) | ORAL | Status: DC
  Administered 2018-03-26 – 2018-03-29 (×9): 50 mg via ORAL
  Filled 2018-03-26 (×10): qty 2

## 2018-03-26 MED ORDER — ENOXAPARIN SODIUM 40 MG/0.4ML ~~LOC~~ SOLN
40.0000 mg | SUBCUTANEOUS | Status: DC
  Administered 2018-03-27 – 2018-04-03 (×8): 40 mg via SUBCUTANEOUS
  Filled 2018-03-26 (×10): qty 0.4

## 2018-03-26 MED ORDER — FUROSEMIDE 10 MG/ML IJ SOLN
80.0000 mg | Freq: Once | INTRAMUSCULAR | Status: AC
  Administered 2018-03-26: 80 mg via INTRAVENOUS
  Filled 2018-03-26: qty 8

## 2018-03-26 MED ORDER — INSULIN ASPART 100 UNIT/ML ~~LOC~~ SOLN
0.0000 [IU] | Freq: Three times a day (TID) | SUBCUTANEOUS | Status: DC
  Administered 2018-03-27: 5 [IU] via SUBCUTANEOUS
  Administered 2018-03-27 – 2018-03-28 (×5): 3 [IU] via SUBCUTANEOUS
  Administered 2018-03-29: 2 [IU] via SUBCUTANEOUS
  Administered 2018-03-29: 3 [IU] via SUBCUTANEOUS
  Administered 2018-03-29: 5 [IU] via SUBCUTANEOUS
  Administered 2018-03-30 (×2): 3 [IU] via SUBCUTANEOUS
  Administered 2018-03-30 – 2018-03-31 (×2): 2 [IU] via SUBCUTANEOUS
  Administered 2018-03-31 – 2018-04-01 (×3): 3 [IU] via SUBCUTANEOUS
  Administered 2018-04-01 (×2): 2 [IU] via SUBCUTANEOUS
  Administered 2018-04-02: 3 [IU] via SUBCUTANEOUS
  Administered 2018-04-02: 2 [IU] via SUBCUTANEOUS
  Administered 2018-04-02 – 2018-04-04 (×5): 3 [IU] via SUBCUTANEOUS

## 2018-03-26 MED ORDER — SODIUM CHLORIDE 0.9% FLUSH
3.0000 mL | INTRAVENOUS | Status: DC | PRN
  Administered 2018-03-29: 3 mL via INTRAVENOUS
  Filled 2018-03-26: qty 3

## 2018-03-26 MED ORDER — ONDANSETRON HCL 4 MG/2ML IJ SOLN
4.0000 mg | Freq: Four times a day (QID) | INTRAMUSCULAR | Status: DC | PRN
  Administered 2018-03-29: 4 mg via INTRAVENOUS
  Filled 2018-03-26: qty 2

## 2018-03-26 MED ORDER — INSULIN ASPART 100 UNIT/ML ~~LOC~~ SOLN
5.0000 [IU] | Freq: Three times a day (TID) | SUBCUTANEOUS | Status: DC
  Administered 2018-03-27 – 2018-04-04 (×24): 5 [IU] via SUBCUTANEOUS

## 2018-03-26 MED ORDER — HYDRALAZINE HCL 20 MG/ML IJ SOLN
10.0000 mg | INTRAMUSCULAR | Status: DC | PRN
  Administered 2018-04-01 – 2018-04-04 (×2): 10 mg via INTRAVENOUS
  Filled 2018-03-26 (×2): qty 1

## 2018-03-26 MED ORDER — NITROGLYCERIN 2 % TD OINT
1.0000 [in_us] | TOPICAL_OINTMENT | Freq: Four times a day (QID) | TRANSDERMAL | Status: DC
  Administered 2018-03-26 – 2018-03-29 (×10): 1 [in_us] via TOPICAL
  Filled 2018-03-26 (×11): qty 1

## 2018-03-26 MED ORDER — ALLOPURINOL 300 MG PO TABS
300.0000 mg | ORAL_TABLET | Freq: Every day | ORAL | Status: DC
  Administered 2018-03-26 – 2018-04-04 (×10): 300 mg via ORAL
  Filled 2018-03-26 (×10): qty 1

## 2018-03-26 MED ORDER — CLONIDINE HCL 0.2 MG PO TABS
0.2000 mg | ORAL_TABLET | Freq: Three times a day (TID) | ORAL | Status: DC
  Administered 2018-03-26 – 2018-03-30 (×14): 0.2 mg via ORAL
  Filled 2018-03-26 (×15): qty 1

## 2018-03-26 MED ORDER — SODIUM CHLORIDE 0.9% FLUSH
3.0000 mL | Freq: Two times a day (BID) | INTRAVENOUS | Status: DC
  Administered 2018-03-26 – 2018-04-04 (×18): 3 mL via INTRAVENOUS

## 2018-03-26 NOTE — ED Notes (Signed)
Call to floor Room assignment has been changed Nurse to be informed and call for report

## 2018-03-26 NOTE — ED Provider Notes (Signed)
[facility] EMERGENCY DEPARTMENT Provider Note   CSN: [id number] Arrival date & time: 03/26/18  7408     History   Chief Complaint Chief Complaint  Patient presents with  . Shortness of Breath    HPI Kelly Key is a 47 y.o. male.  Patient has a history of congestive heart failure.  Patient states that he is becoming more short of breath cannot lay flat and his ankles are swelling.  He was discharged from the hospital at [facility] 2 days ago.  He does not have cardiology follow-up  The history is provided by the patient. No language interpreter was used.  Shortness of Breath  This is a recurrent problem. The current episode started 2 days ago. The problem has not changed since onset.Pertinent negatives include no fever, no headaches, no cough, no chest pain, no abdominal pain and no rash. Precipitated by: Hypertension poorly controlled. Risk factors include recent prolonged sitting. He has tried nothing for the symptoms. The treatment provided no relief. He has had prior hospitalizations. He has had prior ED visits. Associated medical issues do not include PE.    Past Medical History:  Diagnosis Date  . Acute diastolic CHF (congestive heart failure) ([place]) 06/11/2012   EF 50-55% [facility])  . Diabetes mellitus    A1c 11.5 (06/11/2012).  . Gout   . Hepatic steatosis 06/11/2012   Elevated LFTs  . Hyperlipemia   . Malignant hypertension   . Microcytic anemia 06/12/2012  . Obesity     Patient Active Problem List   Diagnosis Date Noted  . CHF exacerbation ([place]) 03/26/2018  . Diabetes mellitus ([place]) 06/12/2012  . Hyponatremia 06/12/2012  . Microcytic anemia 06/12/2012  . Acute diastolic CHF (congestive heart failure) ([place]) 06/11/2012  . HTN (hypertension) 06/11/2012  . Elevated LFTs 06/11/2012  . Obesity 06/11/2012  . Hepatic steatosis 06/11/2012    History reviewed. No pertinent surgical history.      Home Medications    Prior to Admission medications     Medication Sig Start Date End Date Taking? Authorizing Provider  allopurinol (ZYLOPRIM) 300 MG tablet Take 300 mg by mouth daily.   Yes [provider]  atorvastatin (LIPITOR) 80 MG tablet Take 80 mg by mouth daily.   Yes [provider]  cloNIDine (CATAPRES) 0.2 MG tablet Take 0.2 mg by mouth 3 (three) times daily.   Yes [provider]  hydrALAZINE (APRESOLINE) 50 MG tablet Take 50 mg by mouth 3 (three) times daily.   Yes [provider]  insulin aspart (NOVOLOG) 100 UNIT/ML injection Inject 5 Units into the skin 3 (three) times daily before meals.   Yes [provider]  insulin detemir (LEVEMIR) 100 UNIT/ML injection Inject 10 Units into the skin at bedtime.   Yes [provider]  labetalol (NORMODYNE) 200 MG tablet Take 400 mg by mouth 2 (two) times daily.   Yes [provider]  losartan (COZAAR) 100 MG tablet Take 100 mg by mouth daily.   Yes [provider]  metFORMIN (GLUCOPHAGE) 1000 MG tablet Take 1,000 mg by mouth 2 (two) times daily with a meal.   Yes [provider]  sodium bicarbonate 650 MG tablet Take 650 mg by mouth 2 (two) times daily.   Yes [provider]    Family History No family history on file.  Social History Social History   Tobacco Use  . Smoking status: Never Smoker  . Smokeless tobacco: Never Used  Substance Use Topics  .  Alcohol use: No  . Drug use: No     Allergies   Lisinopril   Review of Systems Review of Systems  Constitutional: Negative for appetite change, fatigue and fever.  HENT: Negative for congestion, ear discharge and sinus pressure.   Eyes: Negative for discharge.  Respiratory: Positive for shortness of breath. Negative for cough.   Cardiovascular: Negative for chest pain.  Gastrointestinal: Negative for abdominal pain and diarrhea.  Genitourinary: Negative for frequency and hematuria.  Musculoskeletal: Negative for back pain.  Skin: Negative  for rash.  Neurological: Negative for seizures and headaches.  Psychiatric/Behavioral: Negative for hallucinations.     Physical Exam Updated Vital Signs BP (!) 209/113   Pulse (!) 101   Resp (!) 21   Wt 134.3 kg (296 lb)   SpO2 94%   BMI 40.14 kg/m   Physical Exam  Constitutional: He is oriented to person, place, and time. He appears well-developed.  HENT:  Head: Normocephalic.  Eyes: Conjunctivae and EOM are normal. No scleral icterus.  Neck: Neck supple. No thyromegaly present.  Cardiovascular: Normal rate and regular rhythm. Exam reveals no gallop and no friction rub.  No murmur heard. Pulmonary/Chest: No stridor. He has no wheezes. He has no rales. He exhibits no tenderness.  Abdominal: He exhibits no distension. There is no tenderness. There is no rebound.  Musculoskeletal: Normal range of motion. He exhibits no edema.  2+ edema in his lower extremities  Lymphadenopathy:    He has no cervical adenopathy.  Neurological: He is oriented to person, place, and time. He exhibits normal muscle tone. Coordination normal.  Skin: No rash noted. No erythema.  Psychiatric: He has a normal mood and affect. His behavior is normal.     ED Treatments / Results  Labs (all labs ordered are listed, but only abnormal results are displayed) Labs Reviewed  CBC WITH DIFFERENTIAL/PLATELET - Abnormal; Notable for the following components:      Result Value   WBC 10.7 (*)    RBC 4.02 (*)    Hemoglobin 9.7 (*)    HCT 30.7 (*)    MCV 76.4 (*)    MCH 24.1 (*)    Platelets 411 (*)    All other components within normal limits  COMPREHENSIVE METABOLIC PANEL - Abnormal; Notable for the following components:   Chloride 99 (*)    Glucose, Bld 183 (*)    BUN 43 (*)    Creatinine, Ser 2.24 (*)    Albumin 3.0 (*)    GFR calc non Af Amer 33 (*)    GFR calc Af Amer 38 (*)    All other components within normal limits  BRAIN NATRIURETIC PEPTIDE - Abnormal; Notable for the following components:    B Natriuretic Peptide 317.0 (*)    All other components within normal limits  TROPONIN I - Abnormal; Notable for the following components:   Troponin I 0.03 (*)    All other components within normal limits    EKG EKG Interpretation  Date/Time:  Friday Mar 26 2018 06:40:33 EDT Ventricular Rate:  102 PR Interval:    QRS Duration: 104 QT Interval:  356 QTC Calculation: 464 R Axis:   145 Text Interpretation:  Sinus tachycardia Borderline prolonged PR interval Right axis deviation Probable anteroseptal infarct, old Baseline wander in lead(s) V5 V6 Confirmed by Milton Ferguson [phone]) on 03/26/2018 7:15:30 AM   Radiology Dg Chest Port 1 View  Result Date: 03/26/2018 CLINICAL DATA:  Chronic shortness of breath. EXAM: PORTABLE CHEST  1 VIEW COMPARISON:  Chest radiograph performed 06/11/2012 FINDINGS: The lungs are well-aerated. Vascular congestion is noted. Increased interstitial markings raise concern for pulmonary edema. There is no evidence of pleural effusion or pneumothorax. The cardiomediastinal silhouette is mildly enlarged. No acute osseous abnormalities are seen. IMPRESSION: Vascular congestion and mild cardiomegaly. Increased interstitial markings raise concern for pulmonary edema. Electronically Signed   By: Garald Balding M.D.   On: 03/26/2018 07:08    Procedures Procedures (including critical care time)  Medications Ordered in ED Medications  furosemide (LASIX) injection 40 mg (40 mg Intravenous Given 03/26/18 0809)  atenolol (TENORMIN) tablet 50 mg (50 mg Oral Given 03/26/18 0913)  cloNIDine (CATAPRES) tablet 0.1 mg (0.1 mg Oral Given 03/26/18 0913)     Initial Impression / Assessment and Plan / ED Course  I have reviewed the triage vital signs and the nursing notes.  Pertinent labs & imaging results that were available during my care of the patient were reviewed by me and considered in my medical decision making (see chart for details).   Chest x-ray shows congestive  heart failure.  Troponin slightly elevated.  Also BNP elevated.  Patient was given some Lasix and he did put out 500 cc.  I spoke with cardiology about his congestive heart failure and it was felt the patient should be admitted to the hospital and to get an echo and continue work-up his heart failure    Final Clinical Impressions(s) / ED Diagnoses   Final diagnoses:  Acute systolic congestive heart failure [facility])    ED Discharge Orders    None       Milton Ferguson, MD 03/26/18 [phone]

## 2018-03-26 NOTE — ED Notes (Signed)
CRITICAL VALUE ALERT  Critical Value:  Troponin 0.03  Date & Time Notied:  03/26/18 0721  Provider Notified: Dr. Roderic Palau  Orders Received/Actions taken: EDP notified, no further orders given

## 2018-03-26 NOTE — ED Notes (Signed)
Call to floor for report 

## 2018-03-26 NOTE — ED Notes (Signed)
Report to Brittney, RN

## 2018-03-26 NOTE — ED Notes (Signed)
Call to Tim, RN, [facility]- bed marked assigned for prolonged period- will he inquire when ready   Meal provided

## 2018-03-26 NOTE — Plan of Care (Signed)
Continue planned regimen 

## 2018-03-26 NOTE — ED Notes (Signed)
Awaiting bed assignment.

## 2018-03-26 NOTE — H&P (Signed)
History and Physical    Kelly Key:[id number] DOB: [birth year] DOA: 03/26/2018  PCP: Neale Burly, MD  Patient coming from: Home  I have personally briefly reviewed patient's old medical records in [place]  Chief Complaint: Shortness of breath  HPI: Kelly Key is a 47 y.o. male with medical history significant of hypertension, chronic kidney disease, who was recently discharged from [facility] on 5/28 after being treated for heart failure.  The patient reports that he was there for almost a week.  After returning home he initially felt better, but shortness of breath quickly returned.  He went back to the emergency room at [facility] yesterday, but left the hospital [place] when he was advised further work-up would be necessary.  He comes to the emergency room today with progressive shortness of breath.  History is somewhat limited due to his respiratory distress.  Denies any chest pain, but feels that he cannot breathe.  On my arrival, patient was laying down and wife reports that he did not have oxygen on.  He is noticing worsening swelling in his legs over the past few days.  He has a nonproductive cough.  ED Course: Patient was noted to be markedly hypertensive.  Chest x-ray indicated evidence of pulmonary edema.  BNP mildly elevated.  Creatinine also elevated.  He was given a dose of intravenous Lasix and referred for admission.  Review of Systems: As per HPI otherwise 10 point review of systems negative.    Past Medical History:  Diagnosis Date  . Acute diastolic CHF (congestive heart failure) ([place]) 06/11/2012   EF 50-55% [facility])  . Diabetes mellitus    A1c 11.5 (06/11/2012).  . Gout   . Hepatic steatosis 06/11/2012   Elevated LFTs  . Hyperlipemia   . Malignant hypertension   . Microcytic anemia 06/12/2012  . Obesity     History reviewed. No pertinent surgical history.   reports that he has never smoked. He has never used  smokeless tobacco. He reports that he does not drink alcohol or use drugs.  Allergies  Allergen Reactions  . Lisinopril Swelling   Family history: Family history reviewed and not pertinent  Prior to Admission medications   Medication Sig Start Date End Date Taking? Authorizing Provider  allopurinol (ZYLOPRIM) 300 MG tablet Take 300 mg by mouth daily.   Yes [provider]  atorvastatin (LIPITOR) 80 MG tablet Take 80 mg by mouth daily.   Yes [provider]  cloNIDine (CATAPRES) 0.2 MG tablet Take 0.2 mg by mouth 3 (three) times daily.   Yes [provider]  hydrALAZINE (APRESOLINE) 50 MG tablet Take 50 mg by mouth 3 (three) times daily.   Yes [provider]  insulin aspart (NOVOLOG) 100 UNIT/ML injection Inject 5 Units into the skin 3 (three) times daily before meals.   Yes [provider]  insulin detemir (LEVEMIR) 100 UNIT/ML injection Inject 10 Units into the skin at bedtime.   Yes [provider]  labetalol (NORMODYNE) 200 MG tablet Take 400 mg by mouth 2 (two) times daily.   Yes [provider]  losartan (COZAAR) 100 MG tablet Take 100 mg by mouth daily.   Yes [provider]  metFORMIN (GLUCOPHAGE) 1000 MG tablet Take 1,000 mg by mouth 2 (two) times daily with a meal.   Yes [provider]  sodium bicarbonate 650 MG tablet Take 650 mg by mouth 2 (two) times daily.   Yes [provider]    Physical Exam: Vitals:   03/26/18 1345 03/26/18 1348 03/26/18 1705 03/26/18 1715  BP:   (!) 223/125   Pulse:   94   Resp:   (!) 22   Temp:      TempSrc:      SpO2: (!) 88% 95% 92% 92%  Weight:      Height:        Constitutional: Increased respiratory effort, mildly diaphoretic, sitting up in bed Vitals:   03/26/18 1345 03/26/18 1348 03/26/18 1705 03/26/18 1715  BP:   (!) 223/125   Pulse:   94   Resp:   (!) 22   Temp:      TempSrc:      SpO2: (!) 88% 95% 92% 92%  Weight:      Height:        Eyes: PERRL, lids and conjunctivae normal ENMT: Mucous membranes are moist. Posterior pharynx clear of any exudate or lesions.Normal dentition.  Neck: normal, supple, no masses, no thyromegaly Respiratory: Crackles at bases with increased respiratory effort Cardiovascular: Regular rate and rhythm, no murmurs / rubs / gallops.  1-2+ extremity edema. 2+ pedal pulses. No carotid bruits.  Abdomen: no tenderness, no masses palpated. No hepatosplenomegaly. Bowel sounds positive.  Musculoskeletal: no clubbing / cyanosis. No joint deformity upper and lower extremities. Good ROM, no contractures. Normal muscle tone.  Skin: no rashes, lesions, ulcers. No induration Neurologic: CN 2-12 grossly intact. Sensation intact, DTR normal. Strength 5/5 in all 4.  Psychiatric: Normal judgment and insight. Alert and oriented x 3. Normal mood.    Labs on Admission: I have personally reviewed following labs and imaging studies  CBC: Recent Labs  Lab 03/26/18 0647  WBC 10.7*  NEUTROABS 7.3  HGB 9.7*  HCT 30.7*  MCV 76.4*  PLT 144*   Basic Metabolic Panel: Recent Labs  Lab 03/26/18 0647  NA 135  K 4.2  CL 99*  CO2 26  GLUCOSE 183*  BUN 43*  CREATININE 2.24*  CALCIUM 9.0   GFR: Estimated Creatinine Clearance: 55.4 mL/min (A) (by C-G formula based on SCr of 2.24 mg/dL (H)). Liver Function Tests: Recent Labs  Lab 03/26/18 0647  AST 17  ALT 26  ALKPHOS 85  BILITOT 0.6  PROT 7.6  ALBUMIN 3.0*   No results for input(s): LIPASE, AMYLASE in the last 168 hours. No results for input(s): AMMONIA in the last 168 hours. Coagulation Profile: No results for input(s): INR, PROTIME in the last 168 hours. Cardiac Enzymes: Recent Labs  Lab 03/26/18 0647  TROPONINI 0.03*   BNP (last 3 results) No results for input(s): PROBNP in the last 8760 hours. HbA1C: No results for input(s): HGBA1C in the last 72 hours. CBG: Recent Labs  Lab 03/26/18 1715  GLUCAP 209*   Lipid Profile: No results  for input(s): CHOL, HDL, LDLCALC, TRIG, CHOLHDL, LDLDIRECT in the last 72 hours. Thyroid Function Tests: No results for input(s): TSH, T4TOTAL, FREET4, T3FREE, THYROIDAB in the last 72 hours. Anemia Panel: No results for input(s): VITAMINB12, FOLATE, FERRITIN, TIBC, IRON, RETICCTPCT in the last 72 hours. Urine analysis:    Component Value Date/Time   COLORURINE YELLOW 06/11/2012 0518   APPEARANCEUR CLEAR 06/11/2012 0518   LABSPEC >1.030 (H) 06/11/2012 0518   PHURINE 5.5 06/11/2012 0518   GLUCOSEU NEGATIVE 06/11/2012 0518   HGBUR NEGATIVE 06/11/2012 0518   BILIRUBINUR NEGATIVE 06/11/2012 0518   KETONESUR NEGATIVE 06/11/2012 0518   PROTEINUR 100 (A) 06/11/2012 0518   UROBILINOGEN 0.2 06/11/2012  0518   NITRITE NEGATIVE 06/11/2012 0518   LEUKOCYTESUR NEGATIVE 06/11/2012 0518    Radiological Exams on Admission: Dg Chest Port 1 View  Result Date: 03/26/2018 CLINICAL DATA:  Chronic shortness of breath. EXAM: PORTABLE CHEST 1 VIEW COMPARISON:  Chest radiograph performed 06/11/2012 FINDINGS: The lungs are well-aerated. Vascular congestion is noted. Increased interstitial markings raise concern for pulmonary edema. There is no evidence of pleural effusion or pneumothorax. The cardiomediastinal silhouette is mildly enlarged. No acute osseous abnormalities are seen. IMPRESSION: Vascular congestion and mild cardiomegaly. Increased interstitial markings raise concern for pulmonary edema. Electronically Signed   By: Garald Balding M.D.   On: 03/26/2018 07:08    EKG: Independently reviewed.  Sinus rhythm without any acute changes  Assessment/Plan Active Problems:   HTN (hypertension)   Morbid obesity (HCC)   Diabetes mellitus (HCC)   CHF exacerbation (HCC)   Acute on chronic diastolic CHF (congestive heart failure) (HCC)   Acute respiratory failure with hypoxia ([place])     1. Acute respiratory failure with hypoxia.  Patient has increased work of breathing due to pulmonary edema and heart  failure.  Supplemental oxygen has been applied.  We will try to wean off as tolerated. 2. CHF exacerbation, likely acute on chronic diastolic congestive heart failure.  Echocardiogram has been ordered.  We will continue the patient on intravenous Lasix.  Not a candidate for ACE inhibitors due to renal dysfunction.  He is already on beta-blockers.  Cycle cardiac markers.  Apply nitroglycerin paste. 3. Hypertension.  Resume the patient's outpatient medications including clonidine, hydralazine, labetalol.  Will discontinue ARB due to renal dysfunction.  Blood pressure should improve as volume status improves. 4. Diabetes.  Continue on Levemir and NovoLog.  Supplement with sliding scale insulin. 5. Chronic kidney disease, likely stage III.  Review from labs drawn on admission at [facility] show baseline creatinine of 2.1 and GFR of 40.  At the time of discharge, creatinine was noted to be elevated at 3.2.  Continue to monitor creatinine in the setting of diuresis.  DVT prophylaxis: lovenox  Code Status: Full code Family Communication: Discussed with wife at the bedside Disposition Plan: Discharge home once improved Consults called:   Admission status: Inpatient, telemetry  Kathie Dike MD [facility] Pager [phone]  If 7PM-7AM, please contact night-coverage [email or web address] Password [email or web address]  03/26/2018, 5:25 PM

## 2018-03-26 NOTE — ED Triage Notes (Signed)
Pt states he has been SOB "for a while now." Pt reports he was discharged from [facility] on Tuesday for the same. Pt states he was told he "needs to find a specialist."

## 2018-03-26 NOTE — ED Notes (Signed)
Room is assigned  AWAITING BED READY

## 2018-03-27 ENCOUNTER — Inpatient Hospital Stay (HOSPITAL_COMMUNITY): Payer: Medicare Other

## 2018-03-27 DIAGNOSIS — I5033 Acute on chronic diastolic (congestive) heart failure: Secondary | ICD-10-CM

## 2018-03-27 LAB — GLUCOSE, CAPILLARY
Glucose-Capillary: 198 mg/dL — ABNORMAL HIGH (ref 65–99)
Glucose-Capillary: 250 mg/dL — ABNORMAL HIGH (ref 65–99)
Glucose-Capillary: 156 mg/dL — ABNORMAL HIGH (ref 65–99)
Glucose-Capillary: 185 mg/dL — ABNORMAL HIGH (ref 65–99)

## 2018-03-27 LAB — BASIC METABOLIC PANEL
Anion gap: 11 (ref 5–15)
BUN: 39 mg/dL — ABNORMAL HIGH (ref 6–20)
CO2: 25 mmol/L (ref 22–32)
Calcium: 8.8 mg/dL — ABNORMAL LOW (ref 8.9–10.3)
Chloride: 100 mmol/L — ABNORMAL LOW (ref 101–111)
Creatinine, Ser: 2.28 mg/dL — ABNORMAL HIGH (ref 0.61–1.24)
GFR calc Af Amer: 37 mL/min — ABNORMAL LOW (ref >60)
GFR calc non Af Amer: 32 mL/min — ABNORMAL LOW (ref >60)
Glucose, Bld: 191 mg/dL — ABNORMAL HIGH (ref 65–99)
Potassium: 3.8 mmol/L (ref 3.5–5.1)
Sodium: 136 mmol/L (ref 135–145)

## 2018-03-27 LAB — ECHOCARDIOGRAM COMPLETE
Height: 67 in
Weight: 5124.8 oz

## 2018-03-27 LAB — HIV ANTIBODY (ROUTINE TESTING W REFLEX): HIV Screen 4th Generation wRfx: NONREACTIVE

## 2018-03-27 LAB — TROPONIN I
Troponin I: 0.03 ng/mL (ref <0.03)
Troponin I: <0.03 ng/mL (ref <0.03)

## 2018-03-27 MED ORDER — LINACLOTIDE 145 MCG PO CAPS
145.0000 ug | ORAL_CAPSULE | Freq: Every day | ORAL | Status: DC
  Administered 2018-03-27 – 2018-04-04 (×8): 145 ug via ORAL
  Filled 2018-03-27 (×9): qty 1

## 2018-03-27 NOTE — Progress Notes (Signed)
  Echocardiogram 2D Echocardiogram has been performed.  Kelly Key 03/27/2018, 10:25 AM

## 2018-03-27 NOTE — Progress Notes (Signed)
PROGRESS NOTE    Kelly Key  [id number]:[id number] DOB: 03-Feb-1971 DOA: 03/26/2018 PCP: Neale Burly, MD    Brief Narrative:  47 year old male with history of long-standing hypertension, diastolic heart failure, presents to the hospital with worsening shortness of breath due to decompensated CHF.  He was admitted for IV diuresis.   Assessment & Plan:   Active Problems:   HTN (hypertension)   Morbid obesity (HCC)   Diabetes mellitus ([place])   CHF exacerbation (HCC)   Acute on chronic diastolic CHF (congestive heart failure) (HCC)   Acute respiratory failure with hypoxia ([place])   1. Acute respiratory failure with hypoxia.  Related to decompensated CHF.  Appears to be improving and is on room air.  Still having episodes of shortness of breath and orthopnea.  Continue current treatments. 2. CHF exacerbation, likely acute on chronic diastolic heart failure.  Echocardiogram in process.  Appears to be improving with intravenous Lasix, but still has some evidence of volume overload.  Continue current treatments. 3. Hypertension, uncontrolled.  Continued on home dose of clonidine, hydralazine and labetalol.  Blood pressure should improve as volume status improves. 4. Diabetes.  Blood sugar stable.  Continue on Levemir and NovoLog 5. Chronic kidney disease stage III.  Creatinine is currently stable at 2.1.  Continue to monitor in the setting of diuresis.   DVT prophylaxis: Lovenox Code Status: Full code Family Communication: Discussed with family at the bedside Disposition Plan: Discharge home once improved   Consultants:     Procedures:   Echo pending  Antimicrobials:      Subjective: Says he became short of breath while sleeping last night and had to sit up.  Overall he feels his breathing is doing a little better today.  Still complains of lower extremity edema.  Objective: Vitals:   03/27/18 0602 03/27/18 0700 03/27/18 0900 03/27/18 1259  BP: (!) 179/106 (!) 170/100 (!)  191/94 121/68  Pulse: 87 89 93 86  Resp: 18 18 18 18   Temp:  98.2 F (36.8 C)  99 F (37.2 C)  TempSrc:  Oral  Oral  SpO2: 100% 95% 94% 95%  Weight: (!) 145.3 kg (320 lb 4.8 oz)     Height:        Intake/Output Summary (Last 24 hours) at 03/27/2018 1307 Last data filed at 03/27/2018 1305 Gross per 24 hour  Intake 720 ml  Output 3000 ml  Net -2280 ml   Filed Weights   03/26/18 0635 03/26/18 1343 03/27/18 0602  Weight: 134.3 kg (296 lb) (!) 146.2 kg (322 lb 5 oz) (!) 145.3 kg (320 lb 4.8 oz)    Examination:  General exam: Appears calm and comfortable  Respiratory system: Clear to auscultation. Respiratory effort normal. Cardiovascular system: S1 & S2 heard, RRR. No JVD, murmurs, rubs, gallops or clicks. 1-2+ pedal edema. Gastrointestinal system: Abdomen is nondistended, soft and nontender. No organomegaly or masses felt. Normal bowel sounds heard. Central nervous system: Alert and oriented. No focal neurological deficits. Extremities: Symmetric 5 x 5 power. Skin: No rashes, lesions or ulcers Psychiatry: Judgement and insight appear normal. Mood & affect appropriate.     Data Reviewed: I have personally reviewed following labs and imaging studies  CBC: Recent Labs  Lab 03/26/18 0647  WBC 10.7*  NEUTROABS 7.3  HGB 9.7*  HCT 30.7*  MCV 76.4*  PLT 474*   Basic Metabolic Panel: Recent Labs  Lab 03/26/18 0647 03/27/18 0707  NA 135 136  K 4.2 3.8  CL 99* 100*  CO2 26 25  GLUCOSE 183* 191*  BUN 43* 39*  CREATININE 2.24* 2.28*  CALCIUM 9.0 8.8*   GFR: Estimated Creatinine Clearance: 54.2 mL/min (A) (by C-G formula based on SCr of 2.28 mg/dL (H)). Liver Function Tests: Recent Labs  Lab 03/26/18 0647  AST 17  ALT 26  ALKPHOS 85  BILITOT 0.6  PROT 7.6  ALBUMIN 3.0*   No results for input(s): LIPASE, AMYLASE in the last 168 hours. No results for input(s): AMMONIA in the last 168 hours. Coagulation Profile: No results for input(s): INR, PROTIME in the last  168 hours. Cardiac Enzymes: Recent Labs  Lab 03/26/18 0647 03/26/18 1735 03/26/18 2258 03/27/18 0707  TROPONINI 0.03* 0.03* 0.03* <0.03   BNP (last 3 results) No results for input(s): PROBNP in the last 8760 hours. HbA1C: No results for input(s): HGBA1C in the last 72 hours. CBG: Recent Labs  Lab 03/26/18 1715 03/26/18 2154 03/27/18 0751 03/27/18 1108  GLUCAP 209* 207* 198* 250*   Lipid Profile: No results for input(s): CHOL, HDL, LDLCALC, TRIG, CHOLHDL, LDLDIRECT in the last 72 hours. Thyroid Function Tests: No results for input(s): TSH, T4TOTAL, FREET4, T3FREE, THYROIDAB in the last 72 hours. Anemia Panel: No results for input(s): VITAMINB12, FOLATE, FERRITIN, TIBC, IRON, RETICCTPCT in the last 72 hours. Sepsis Labs: No results for input(s): PROCALCITON, LATICACIDVEN in the last 168 hours.  No results found for this or any previous visit (from the past 240 hour(s)).       Radiology Studies: Dg Chest Port 1 View  Result Date: 03/26/2018 CLINICAL DATA:  Chronic shortness of breath. EXAM: PORTABLE CHEST 1 VIEW COMPARISON:  Chest radiograph performed 06/11/2012 FINDINGS: The lungs are well-aerated. Vascular congestion is noted. Increased interstitial markings raise concern for pulmonary edema. There is no evidence of pleural effusion or pneumothorax. The cardiomediastinal silhouette is mildly enlarged. No acute osseous abnormalities are seen. IMPRESSION: Vascular congestion and mild cardiomegaly. Increased interstitial markings raise concern for pulmonary edema. Electronically Signed   By: Garald Balding M.D.   On: 03/26/2018 07:08        Scheduled Meds: . allopurinol  300 mg Oral Daily  . atorvastatin  80 mg Oral Daily  . cloNIDine  0.2 mg Oral TID  . enoxaparin (LOVENOX) injection  40 mg Subcutaneous Q24H  . furosemide  60 mg Intravenous BID  . hydrALAZINE  50 mg Oral TID  . insulin aspart  0-15 Units Subcutaneous TID WC  . insulin aspart  0-5 Units  Subcutaneous QHS  . insulin aspart  5 Units Subcutaneous TID AC  . insulin detemir  10 Units Subcutaneous QHS  . labetalol  400 mg Oral BID  . nitroGLYCERIN  1 inch Topical Q6H  . polyethylene glycol  17 g Oral Daily  . sodium bicarbonate  650 mg Oral BID  . sodium chloride flush  3 mL Intravenous Q12H   Continuous Infusions: . sodium chloride       LOS: 1 day    Time spent: 49mins Greater than 50% of this time spent in direct contact with patient discussing management of high blood pressure, pulmonary edema, plans on echocardiogram and further work-up of heart failure.    Kathie Dike, MD [facility] Pager [phone]  If 7PM-7AM, please contact night-coverage [email or web address] Password [email or web address] 03/27/2018, 1:07 PM

## 2018-03-28 ENCOUNTER — Inpatient Hospital Stay (HOSPITAL_COMMUNITY): Payer: Medicare Other

## 2018-03-28 LAB — CBC
HCT: 30.7 % — ABNORMAL LOW (ref 39.0–52.0)
Hemoglobin: 9.6 g/dL — ABNORMAL LOW (ref 13.0–17.0)
MCH: 24.1 pg — ABNORMAL LOW (ref 26.0–34.0)
MCHC: 31.3 g/dL (ref 30.0–36.0)
MCV: 76.9 fL — ABNORMAL LOW (ref 78.0–100.0)
Platelets: 405 10*3/uL — ABNORMAL HIGH (ref 150–400)
RBC: 3.99 MIL/uL — ABNORMAL LOW (ref 4.22–5.81)
RDW: 15.1 % (ref 11.5–15.5)
WBC: 9.2 10*3/uL (ref 4.0–10.5)

## 2018-03-28 LAB — GLUCOSE, CAPILLARY
Glucose-Capillary: 162 mg/dL — ABNORMAL HIGH (ref 65–99)
Glucose-Capillary: 178 mg/dL — ABNORMAL HIGH (ref 65–99)
Glucose-Capillary: 180 mg/dL — ABNORMAL HIGH (ref 65–99)
Glucose-Capillary: 197 mg/dL — ABNORMAL HIGH (ref 65–99)

## 2018-03-28 LAB — BASIC METABOLIC PANEL
Anion gap: 11 (ref 5–15)
BUN: 42 mg/dL — ABNORMAL HIGH (ref 6–20)
CO2: 25 mmol/L (ref 22–32)
Calcium: 8.9 mg/dL (ref 8.9–10.3)
Chloride: 99 mmol/L — ABNORMAL LOW (ref 101–111)
Creatinine, Ser: 2.46 mg/dL — ABNORMAL HIGH (ref 0.61–1.24)
GFR calc Af Amer: 34 mL/min — ABNORMAL LOW (ref >60)
GFR calc non Af Amer: 29 mL/min — ABNORMAL LOW (ref >60)
Glucose, Bld: 212 mg/dL — ABNORMAL HIGH (ref 65–99)
Potassium: 4 mmol/L (ref 3.5–5.1)
Sodium: 135 mmol/L (ref 135–145)

## 2018-03-28 NOTE — Progress Notes (Signed)
PROGRESS NOTE    Kelly Key  [id number]:[id number] DOB: [birth year] DOA: 03/26/2018 PCP: Neale Burly, MD    Brief Narrative:  47 year old male with history of long-standing hypertension, diastolic heart failure, presents to the hospital with worsening shortness of breath due to decompensated CHF.  He was admitted for IV diuresis.   Assessment & Plan:   Active Problems:   HTN (hypertension)   Morbid obesity (HCC)   Diabetes mellitus ([place])   CHF exacerbation (HCC)   Acute on chronic diastolic CHF (congestive heart failure) (HCC)   Acute respiratory failure with hypoxia ([place])   1. Acute respiratory failure with hypoxia.  Related to decompensated CHF.  Appears to be improving and is on room air.  Still having episodes of shortness of breath and orthopnea.  Continue current treatments. 2. Acute on chronic diastolic congestive heart failure.  Echocardiogram shows normal ejection fraction with diastolic dysfunction.  Currently on IV Lasix, but still has evidence of volume overload.  Continue current treatments. 3. Hypertension, uncontrolled.  Continued on home dose of clonidine, hydralazine and labetalol.  Overall blood pressure is improving as volume status improves 4. Diabetes.  Blood sugar stable.  Continue on Levemir and NovoLog 5. Chronic kidney disease stage III.  Creatinine currently trending up.  Will likely need to tolerate a higher creatinine in order to achieve diuresis. 6. Constipation.  Resolved with Linzess   DVT prophylaxis: Lovenox Code Status: Full code Family Communication: Discussed with family at the bedside Disposition Plan: Discharge home once improved   Consultants:     Procedures:  Echo:- Left ventricle: The cavity size was normal. Wall thickness was   increased in a pattern of moderate LVH. Systolic function was   normal. The estimated ejection fraction was in the range of 60%   to 65%. Wall motion was normal; there were no regional wall   motion  abnormalities. Findings consistent with left ventricular   diastolic dysfunction, grade indeterminate. Indeterminate filling    pressures.  Antimicrobials:      Subjective: Still feels short of breath although slowly improving.  Feels that his left hand is swollen and painful.  Feels that lower extremity edema is improving  Objective: Vitals:   03/28/18 0922 03/28/18 1200 03/28/18 1516 03/28/18 1612  BP: 116/73 (!) 156/98  125/68  Pulse:  90  78  Resp:  20  18  Temp:      TempSrc:      SpO2:   97% 94%  Weight:      Height:        Intake/Output Summary (Last 24 hours) at 03/28/2018 1620 Last data filed at 03/28/2018 1202 Gross per 24 hour  Intake 3 ml  Output 1100 ml  Net -1097 ml   Filed Weights   03/26/18 0635 03/26/18 1343 03/27/18 0602  Weight: 134.3 kg (296 lb) (!) 146.2 kg (322 lb 5 oz) (!) 145.3 kg (320 lb 4.8 oz)    Examination:  General exam: Alert, awake, oriented x 3 Respiratory system: Clear to auscultation. Respiratory effort normal. Cardiovascular system:RRR. No murmurs, rubs, gallops. Gastrointestinal system: Abdomen is nondistended, soft and nontender. No organomegaly or masses felt. Normal bowel sounds heard. Central nervous system: Alert and oriented. No focal neurological deficits. Extremities: 1-2+ edema in lower extremities bilaterally.  Left hand is also edematous.  Pulses in left hand are strong Skin: No rashes, lesions or ulcers Psychiatry: Judgement and insight appear normal. Mood & affect appropriate.      Data Reviewed: I have  personally reviewed following labs and imaging studies  CBC: Recent Labs  Lab 03/26/18 0647 03/28/18 1252  WBC 10.7* 9.2  NEUTROABS 7.3  --   HGB 9.7* 9.6*  HCT 30.7* 30.7*  MCV 76.4* 76.9*  PLT 411* 998*   Basic Metabolic Panel: Recent Labs  Lab 03/26/18 0647 03/27/18 0707 03/28/18 1252  NA 135 136 135  K 4.2 3.8 4.0  CL 99* 100* 99*  CO2 26 25 25   GLUCOSE 183* 191* 212*  BUN 43* 39* 42*    CREATININE 2.24* 2.28* 2.46*  CALCIUM 9.0 8.8* 8.9   GFR: Estimated Creatinine Clearance: 50.2 mL/min (A) (by C-G formula based on SCr of 2.46 mg/dL (H)). Liver Function Tests: Recent Labs  Lab 03/26/18 0647  AST 17  ALT 26  ALKPHOS 85  BILITOT 0.6  PROT 7.6  ALBUMIN 3.0*   No results for input(s): LIPASE, AMYLASE in the last 168 hours. No results for input(s): AMMONIA in the last 168 hours. Coagulation Profile: No results for input(s): INR, PROTIME in the last 168 hours. Cardiac Enzymes: Recent Labs  Lab 03/26/18 0647 03/26/18 1735 03/26/18 2258 03/27/18 0707  TROPONINI 0.03* 0.03* 0.03* <0.03   BNP (last 3 results) No results for input(s): PROBNP in the last 8760 hours. HbA1C: No results for input(s): HGBA1C in the last 72 hours. CBG: Recent Labs  Lab 03/27/18 1108 03/27/18 1626 03/27/18 2056 03/28/18 0744 03/28/18 1138  GLUCAP 250* 156* 185* 162* 178*   Lipid Profile: No results for input(s): CHOL, HDL, LDLCALC, TRIG, CHOLHDL, LDLDIRECT in the last 72 hours. Thyroid Function Tests: No results for input(s): TSH, T4TOTAL, FREET4, T3FREE, THYROIDAB in the last 72 hours. Anemia Panel: No results for input(s): VITAMINB12, FOLATE, FERRITIN, TIBC, IRON, RETICCTPCT in the last 72 hours. Sepsis Labs: No results for input(s): PROCALCITON, LATICACIDVEN in the last 168 hours.  No results found for this or any previous visit (from the past 240 hour(s)).       Radiology Studies: US Venous Img Upper Uni Left  Result Date: 03/28/2018 CLINICAL DATA:  Left upper extremity edema since being admitted on 03/16/2018. Evaluate for DVT. EXAM: LEFT UPPER EXTREMITY VENOUS DOPPLER ULTRASOUND TECHNIQUE: Gray-scale sonography with graded compression, as well as color Doppler and duplex ultrasound were performed to evaluate the upper extremity deep venous system from the level of the subclavian vein and including the jugular, axillary, basilic, radial, ulnar and upper cephalic  vein. Spectral Doppler was utilized to evaluate flow at rest and with distal augmentation maneuvers. COMPARISON:  None. FINDINGS: Contralateral Subclavian Vein: Respiratory phasicity is normal and symmetric with the symptomatic side. No evidence of thrombus. Normal compressibility. Internal Jugular Vein: No evidence of thrombus. Normal compressibility, respiratory phasicity and response to augmentation. Subclavian Vein: No evidence of thrombus. Normal compressibility, respiratory phasicity and response to augmentation. Axillary Vein: No evidence of thrombus. Normal compressibility, respiratory phasicity and response to augmentation. Cephalic Vein: No evidence of thrombus. Normal compressibility, respiratory phasicity and response to augmentation. Basilic Vein: No evidence of thrombus. Normal compressibility, respiratory phasicity and response to augmentation. Brachial Veins: No evidence of thrombus. Normal compressibility, respiratory phasicity and response to augmentation. Radial Veins: No evidence of thrombus. Normal compressibility, respiratory phasicity and response to augmentation. Ulnar Veins: No evidence of thrombus. Normal compressibility, respiratory phasicity and response to augmentation. Venous Reflux:  None visualized. Other Findings:  None visualized. IMPRESSION: No evidence of DVT within the left upper extremity. Electronically Signed   By: Sandi Mariscal M.D.   On: 03/28/2018 10:36  Scheduled Meds: . allopurinol  300 mg Oral Daily  . atorvastatin  80 mg Oral Daily  . cloNIDine  0.2 mg Oral TID  . enoxaparin (LOVENOX) injection  40 mg Subcutaneous Q24H  . furosemide  60 mg Intravenous BID  . hydrALAZINE  50 mg Oral TID  . insulin aspart  0-15 Units Subcutaneous TID WC  . insulin aspart  0-5 Units Subcutaneous QHS  . insulin aspart  5 Units Subcutaneous TID AC  . insulin detemir  10 Units Subcutaneous QHS  . labetalol  400 mg Oral BID  . linaclotide  145 mcg Oral QAC breakfast  .  nitroGLYCERIN  1 inch Topical Q6H  . polyethylene glycol  17 g Oral Daily  . sodium bicarbonate  650 mg Oral BID  . sodium chloride flush  3 mL Intravenous Q12H   Continuous Infusions: . sodium chloride       LOS: 2 days    Time spent: 30 minutes    Kathie Dike, MD [facility] Pager [phone]  If 7PM-7AM, please contact night-coverage [email or web address] Password [email or web address] 03/28/2018, 4:20 PM

## 2018-03-29 LAB — GLUCOSE, CAPILLARY
Glucose-Capillary: 182 mg/dL — ABNORMAL HIGH (ref 65–99)
Glucose-Capillary: 146 mg/dL — ABNORMAL HIGH (ref 65–99)
Glucose-Capillary: 228 mg/dL — ABNORMAL HIGH (ref 65–99)
Glucose-Capillary: 189 mg/dL — ABNORMAL HIGH (ref 65–99)

## 2018-03-29 LAB — BASIC METABOLIC PANEL
Anion gap: 10 (ref 5–15)
BUN: 42 mg/dL — ABNORMAL HIGH (ref 6–20)
CO2: 26 mmol/L (ref 22–32)
Calcium: 8.7 mg/dL — ABNORMAL LOW (ref 8.9–10.3)
Chloride: 98 mmol/L — ABNORMAL LOW (ref 101–111)
Creatinine, Ser: 2.66 mg/dL — ABNORMAL HIGH (ref 0.61–1.24)
GFR calc Af Amer: 31 mL/min — ABNORMAL LOW (ref >60)
GFR calc non Af Amer: 27 mL/min — ABNORMAL LOW (ref >60)
Glucose, Bld: 221 mg/dL — ABNORMAL HIGH (ref 65–99)
Potassium: 3.8 mmol/L (ref 3.5–5.1)
Sodium: 134 mmol/L — ABNORMAL LOW (ref 135–145)

## 2018-03-29 MED ORDER — FUROSEMIDE 10 MG/ML IJ SOLN
40.0000 mg | Freq: Two times a day (BID) | INTRAMUSCULAR | Status: DC
  Administered 2018-03-29 – 2018-04-01 (×6): 40 mg via INTRAVENOUS
  Filled 2018-03-29 (×6): qty 4

## 2018-03-29 NOTE — Care Management Important Message (Signed)
Important Message  Patient Details  Name: Kelly Key MRN: [id number] Date of Birth: 1971/04/20   Medicare Important Message Given:  Yes    [name] 03/29/2018, 2:19 PM

## 2018-03-29 NOTE — Progress Notes (Signed)
PROGRESS NOTE    Kelly Key  [id number] DOB: 06/26/1971 DOA: 03/26/2018 PCP: Neale Burly, MD    Brief Narrative:  47 year old male with history of long-standing hypertension, diastolic heart failure, presents to the hospital with worsening shortness of breath due to decompensated CHF.  He was admitted for IV diuresis.   Assessment & Plan:   Active Problems:   HTN (hypertension)   Morbid obesity (HCC)   Diabetes mellitus ([place])   CHF exacerbation (HCC)   Acute on chronic diastolic CHF (congestive heart failure) (HCC)   Acute respiratory failure with hypoxia ([place])   1. Acute respiratory failure with hypoxia.  Related to decompensated CHF.  Appears to be improving and is on room air.  Still having episodes of shortness of breath and orthopnea.  Continue current treatments. 2. Acute on chronic diastolic congestive heart failure.  Echocardiogram shows normal ejection fraction with diastolic dysfunction.  Currently on IV Lasix, but still has evidence of volume overload.  Weight is down approximately 8 pounds since admission.  Since creatinine is trending up, will decrease Lasix dose to 40 mg twice daily.  Continue current treatments. 3. Hypertension, uncontrolled.  Continued on home dose of clonidine and labetalol.  Not a candidate for ACE inhibitors due to renal dysfunction.  He is refusing to take any further hydralazine.  We will continue to monitor.   4. Diabetes.  Blood sugar stable.  Continue on Levemir and NovoLog 5. Chronic kidney disease stage III.  Creatinine currently trending up.  Will likely need to tolerate a higher creatinine in order to achieve diuresis. 6. Constipation.  Resolved with Linzess   DVT prophylaxis: Lovenox Code Status: Full code Family Communication: Discussed with family at the bedside Disposition Plan: Discharge home once improved   Consultants:     Procedures:  Echo:- Left ventricle: The cavity size was normal. Wall thickness was  increased in a pattern of moderate LVH. Systolic function was   normal. The estimated ejection fraction was in the range of 60%   to 65%. Wall motion was normal; there were no regional wall   motion abnormalities. Findings consistent with left ventricular   diastolic dysfunction, grade indeterminate. Indeterminate filling    pressures.  Antimicrobials:      Subjective: Feeling nauseous today.  No abdominal pain.  Feels that his symptoms are related to hydralazine.  He reports being started on antihypertensives during his last stay at [facility].  Although he reports he was started on several medications during her hospital stay, he is feels that his symptoms are specifically related to hydralazine.  Objective: Vitals:   03/29/18 0615 03/29/18 1213 03/29/18 1454 03/29/18 1643  BP: (!) 150/94 111/68 (!) 123/97 (!) 143/79  Pulse: 87  88   Resp: 20     Temp: 97.6 F (36.4 C)  97.8 F (36.6 C)   TempSrc: Oral  Oral   SpO2: 100%  95%   Weight: (!) 142.6 kg (314 lb 6.4 oz)     Height:        Intake/Output Summary (Last 24 hours) at 03/29/2018 1659 Last data filed at 03/29/2018 0639 Gross per 24 hour  Intake -  Output 1100 ml  Net -1100 ml   Filed Weights   03/26/18 1343 03/27/18 0602 03/29/18 0615  Weight: (!) 146.2 kg (322 lb 5 oz) (!) 145.3 kg (320 lb 4.8 oz) (!) 142.6 kg (314 lb 6.4 oz)    Examination:  General exam: Alert, awake, oriented x 3 Respiratory system:  Clear to auscultation. Respiratory effort normal. Cardiovascular system:RRR. No murmurs, rubs, gallops. Gastrointestinal system: Abdomen is nondistended, soft and nontender. No organomegaly or masses felt. Normal bowel sounds heard. Central nervous system: Alert and oriented. No focal neurological deficits. Extremities: 1-2+ pitting edema bilaterally, improving Skin: No rashes, lesions or ulcers Psychiatry: Judgement and insight appear normal. Mood & affect appropriate.   Data Reviewed: I have personally  reviewed following labs and imaging studies  CBC: Recent Labs  Lab 03/26/18 0647 03/28/18 1252  WBC 10.7* 9.2  NEUTROABS 7.3  --   HGB 9.7* 9.6*  HCT 30.7* 30.7*  MCV 76.4* 76.9*  PLT 411* 903*   Basic Metabolic Panel: Recent Labs  Lab 03/26/18 0647 03/27/18 0707 03/28/18 1252 03/29/18 0936  NA 135 136 135 134*  K 4.2 3.8 4.0 3.8  CL 99* 100* 99* 98*  CO2 26 25 25 26   GLUCOSE 183* 191* 212* 221*  BUN 43* 39* 42* 42*  CREATININE 2.24* 2.28* 2.46* 2.66*  CALCIUM 9.0 8.8* 8.9 8.7*   GFR: Estimated Creatinine Clearance: 45.9 mL/min (A) (by C-G formula based on SCr of 2.66 mg/dL (H)). Liver Function Tests: Recent Labs  Lab 03/26/18 0647  AST 17  ALT 26  ALKPHOS 85  BILITOT 0.6  PROT 7.6  ALBUMIN 3.0*   No results for input(s): LIPASE, AMYLASE in the last 168 hours. No results for input(s): AMMONIA in the last 168 hours. Coagulation Profile: No results for input(s): INR, PROTIME in the last 168 hours. Cardiac Enzymes: Recent Labs  Lab 03/26/18 0647 03/26/18 1735 03/26/18 2258 03/27/18 0707  TROPONINI 0.03* 0.03* 0.03* <0.03   BNP (last 3 results) No results for input(s): PROBNP in the last 8760 hours. HbA1C: No results for input(s): HGBA1C in the last 72 hours. CBG: Recent Labs  Lab 03/28/18 1638 03/28/18 2132 03/29/18 0755 03/29/18 1143 03/29/18 1619  GLUCAP 180* 197* 182* 146* 228*   Lipid Profile: No results for input(s): CHOL, HDL, LDLCALC, TRIG, CHOLHDL, LDLDIRECT in the last 72 hours. Thyroid Function Tests: No results for input(s): TSH, T4TOTAL, FREET4, T3FREE, THYROIDAB in the last 72 hours. Anemia Panel: No results for input(s): VITAMINB12, FOLATE, FERRITIN, TIBC, IRON, RETICCTPCT in the last 72 hours. Sepsis Labs: No results for input(s): PROCALCITON, LATICACIDVEN in the last 168 hours.  No results found for this or any previous visit (from the past 240 hour(s)).       Radiology Studies: US Venous Img Upper Uni Left  Result  Date: 03/28/2018 CLINICAL DATA:  Left upper extremity edema since being admitted on 03/16/2018. Evaluate for DVT. EXAM: LEFT UPPER EXTREMITY VENOUS DOPPLER ULTRASOUND TECHNIQUE: Gray-scale sonography with graded compression, as well as color Doppler and duplex ultrasound were performed to evaluate the upper extremity deep venous system from the level of the subclavian vein and including the jugular, axillary, basilic, radial, ulnar and upper cephalic vein. Spectral Doppler was utilized to evaluate flow at rest and with distal augmentation maneuvers. COMPARISON:  None. FINDINGS: Contralateral Subclavian Vein: Respiratory phasicity is normal and symmetric with the symptomatic side. No evidence of thrombus. Normal compressibility. Internal Jugular Vein: No evidence of thrombus. Normal compressibility, respiratory phasicity and response to augmentation. Subclavian Vein: No evidence of thrombus. Normal compressibility, respiratory phasicity and response to augmentation. Axillary Vein: No evidence of thrombus. Normal compressibility, respiratory phasicity and response to augmentation. Cephalic Vein: No evidence of thrombus. Normal compressibility, respiratory phasicity and response to augmentation. Basilic Vein: No evidence of thrombus. Normal compressibility, respiratory phasicity and response to augmentation.  Brachial Veins: No evidence of thrombus. Normal compressibility, respiratory phasicity and response to augmentation. Radial Veins: No evidence of thrombus. Normal compressibility, respiratory phasicity and response to augmentation. Ulnar Veins: No evidence of thrombus. Normal compressibility, respiratory phasicity and response to augmentation. Venous Reflux:  None visualized. Other Findings:  None visualized. IMPRESSION: No evidence of DVT within the left upper extremity. Electronically Signed   By: Sandi Mariscal M.D.   On: 03/28/2018 10:36        Scheduled Meds: . allopurinol  300 mg Oral Daily  .  atorvastatin  80 mg Oral Daily  . cloNIDine  0.2 mg Oral TID  . enoxaparin (LOVENOX) injection  40 mg Subcutaneous Q24H  . furosemide  60 mg Intravenous BID  . hydrALAZINE  50 mg Oral TID  . insulin aspart  0-15 Units Subcutaneous TID WC  . insulin aspart  0-5 Units Subcutaneous QHS  . insulin aspart  5 Units Subcutaneous TID AC  . insulin detemir  10 Units Subcutaneous QHS  . labetalol  400 mg Oral BID  . linaclotide  145 mcg Oral QAC breakfast  . nitroGLYCERIN  1 inch Topical Q6H  . polyethylene glycol  17 g Oral Daily  . sodium bicarbonate  650 mg Oral BID  . sodium chloride flush  3 mL Intravenous Q12H   Continuous Infusions: . sodium chloride       LOS: 3 days    Time spent: 30 minutes    Kathie Dike, MD [facility] Pager [phone]  If 7PM-7AM, please contact night-coverage [email or web address] Password [email or web address] 03/29/2018, 4:59 PM

## 2018-03-29 NOTE — Progress Notes (Signed)
Pt still nauseated. Unable to eat. So only 2 units insulin given. Not the 5 units for meal coverage at this time. Pt also c/o dizziness now. primary RN notifiying dr Roderic Palau and to hold ntg paste until further notice.

## 2018-03-30 LAB — GLUCOSE, CAPILLARY
Glucose-Capillary: 194 mg/dL — ABNORMAL HIGH (ref 65–99)
Glucose-Capillary: 165 mg/dL — ABNORMAL HIGH (ref 65–99)
Glucose-Capillary: 140 mg/dL — ABNORMAL HIGH (ref 65–99)
Glucose-Capillary: 213 mg/dL — ABNORMAL HIGH (ref 65–99)

## 2018-03-30 LAB — BASIC METABOLIC PANEL
Anion gap: 11 (ref 5–15)
BUN: 43 mg/dL — ABNORMAL HIGH (ref 6–20)
CO2: 28 mmol/L (ref 22–32)
Calcium: 8.9 mg/dL (ref 8.9–10.3)
Chloride: 98 mmol/L — ABNORMAL LOW (ref 101–111)
Creatinine, Ser: 2.74 mg/dL — ABNORMAL HIGH (ref 0.61–1.24)
GFR calc Af Amer: 30 mL/min — ABNORMAL LOW (ref >60)
GFR calc non Af Amer: 26 mL/min — ABNORMAL LOW (ref >60)
Glucose, Bld: 191 mg/dL — ABNORMAL HIGH (ref 65–99)
Potassium: 3.9 mmol/L (ref 3.5–5.1)
Sodium: 137 mmol/L (ref 135–145)

## 2018-03-30 MED ORDER — LABETALOL HCL 200 MG PO TABS
400.0000 mg | ORAL_TABLET | Freq: Three times a day (TID) | ORAL | Status: DC
Start: 2018-03-30 — End: 2018-04-04
  Administered 2018-03-30 – 2018-04-04 (×16): 400 mg via ORAL
  Filled 2018-03-30 (×15): qty 2

## 2018-03-30 NOTE — Progress Notes (Addendum)
PROGRESS NOTE    Kelly Key  MWN:[id number] DOB: December 03, 1970 DOA: 03/26/2018 PCP: Neale Burly, MD    Brief Narrative:  47 year old male with history of long-standing hypertension, diastolic heart failure, presents to the hospital with worsening shortness of breath due to decompensated CHF.  He was admitted for IV diuresis.   Assessment & Plan:   Active Problems:   HTN (hypertension)   Morbid obesity (HCC)   Diabetes mellitus ([place])   CHF exacerbation (HCC)   Acute on chronic diastolic CHF (congestive heart failure) (HCC)   Acute respiratory failure with hypoxia ([facility])   1. Acute respiratory failure with hypoxia.  Related to decompensated CHF. Still having episodes of shortness of breath and orthopnea.  Continue current treatments. 2. Acute on chronic diastolic congestive heart failure.  Echocardiogram shows normal ejection fraction with diastolic dysfunction.  Currently on IV Lasix, but still has evidence of volume overload and is still short of breath.  Weight is down approximately 9 pounds since admission.  Continue on intravenous Lasix.  Continue current treatments. 3. Hypertension, uncontrolled.  Continued on home dose of clonidine and labetalol.  Not a candidate for ACE inhibitors due to renal dysfunction.  He is refusing to take any further hydralazine.  Blood pressure has trended up this morning.  Will increase labetalol.  If he continues to remain hypertensive, can consider adding Norvasc. 4. Diabetes.  Blood sugar stable.  Continue on Levemir and NovoLog 5. Acute kidney injury on chronic kidney disease stage III.  Creatinine currently trending up.  Will likely need to tolerate a higher creatinine in order to achieve adequate diuresis. 6. Constipation.  Resolved with Linzess   DVT prophylaxis: Lovenox Code Status: Full code Family Communication: Discussed with family at the bedside Disposition Plan: Discharge home once adequately diuresed   Consultants:      Procedures:  Echo:- Left ventricle: The cavity size was normal. Wall thickness was   increased in a pattern of moderate LVH. Systolic function was   normal. The estimated ejection fraction was in the range of 60%   to 65%. Wall motion was normal; there were no regional wall   motion abnormalities. Findings consistent with left ventricular   diastolic dysfunction, grade indeterminate. Indeterminate filling    pressures.  Antimicrobials:      Subjective: Still feels short of breath.  Feels that overall swelling is improving.  Says that nausea is better after discontinuing hydralazine  Objective: Vitals:   03/29/18 2131 03/30/18 0559 03/30/18 0834 03/30/18 1429  BP: (!) 152/90 (!) 182/103 (!) 154/91 (!) 158/91  Pulse: 83 87 89 79  Resp:      Temp: 97.7 F (36.5 C) 98.3 F (36.8 C)  97.8 F (36.6 C)  TempSrc: Oral Oral  Oral  SpO2: 99% 94% 98% 95%  Weight:  (!) 142.2 kg (313 lb 7.9 oz)    Height:        Intake/Output Summary (Last 24 hours) at 03/30/2018 1752 Last data filed at 03/30/2018 1145 Gross per 24 hour  Intake -  Output 1650 ml  Net -1650 ml   Filed Weights   03/27/18 0602 03/29/18 0615 03/30/18 0559  Weight: (!) 145.3 kg (320 lb 4.8 oz) (!) 142.6 kg (314 lb 6.4 oz) (!) 142.2 kg (313 lb 7.9 oz)    Examination:  General exam: Alert, awake, oriented x 3 Respiratory system: crackles bilaterally. Respiratory effort normal. Cardiovascular system:RRR. No murmurs, rubs, gallops. Gastrointestinal system: Abdomen is nondistended, soft and nontender. No organomegaly or  masses felt. Normal bowel sounds heard. Central nervous system: Alert and oriented. No focal neurological deficits. Extremities: 1+ edema bilaterally Skin: No rashes, lesions or ulcers Psychiatry: Judgement and insight appear normal. Mood & affect appropriate.     Data Reviewed: I have personally reviewed following labs and imaging studies  CBC: Recent Labs  Lab 03/26/18 0647 03/28/18 1252   WBC 10.7* 9.2  NEUTROABS 7.3  --   HGB 9.7* 9.6*  HCT 30.7* 30.7*  MCV 76.4* 76.9*  PLT 411* 782*   Basic Metabolic Panel: Recent Labs  Lab 03/26/18 0647 03/27/18 0707 03/28/18 1252 03/29/18 0936 03/30/18 0636  NA 135 136 135 134* 137  K 4.2 3.8 4.0 3.8 3.9  CL 99* 100* 99* 98* 98*  CO2 26 25 25 26 28   GLUCOSE 183* 191* 212* 221* 191*  BUN 43* 39* 42* 42* 43*  CREATININE 2.24* 2.28* 2.46* 2.66* 2.74*  CALCIUM 9.0 8.8* 8.9 8.7* 8.9   GFR: Estimated Creatinine Clearance: 44.5 mL/min (A) (by C-G formula based on SCr of 2.74 mg/dL (H)). Liver Function Tests: Recent Labs  Lab 03/26/18 0647  AST 17  ALT 26  ALKPHOS 85  BILITOT 0.6  PROT 7.6  ALBUMIN 3.0*   No results for input(s): LIPASE, AMYLASE in the last 168 hours. No results for input(s): AMMONIA in the last 168 hours. Coagulation Profile: No results for input(s): INR, PROTIME in the last 168 hours. Cardiac Enzymes: Recent Labs  Lab 03/26/18 0647 03/26/18 1735 03/26/18 2258 03/27/18 0707  TROPONINI 0.03* 0.03* 0.03* <0.03   BNP (last 3 results) No results for input(s): PROBNP in the last 8760 hours. HbA1C: No results for input(s): HGBA1C in the last 72 hours. CBG: Recent Labs  Lab 03/29/18 1619 03/29/18 2130 03/30/18 0747 03/30/18 1144 03/30/18 1647  GLUCAP 228* 189* 194* 165* 140*   Lipid Profile: No results for input(s): CHOL, HDL, LDLCALC, TRIG, CHOLHDL, LDLDIRECT in the last 72 hours. Thyroid Function Tests: No results for input(s): TSH, T4TOTAL, FREET4, T3FREE, THYROIDAB in the last 72 hours. Anemia Panel: No results for input(s): VITAMINB12, FOLATE, FERRITIN, TIBC, IRON, RETICCTPCT in the last 72 hours. Sepsis Labs: No results for input(s): PROCALCITON, LATICACIDVEN in the last 168 hours.  No results found for this or any previous visit (from the past 240 hour(s)).       Radiology Studies: No results found.      Scheduled Meds: . allopurinol  300 mg Oral Daily  .  atorvastatin  80 mg Oral Daily  . cloNIDine  0.2 mg Oral TID  . enoxaparin (LOVENOX) injection  40 mg Subcutaneous Q24H  . furosemide  40 mg Intravenous BID  . insulin aspart  0-15 Units Subcutaneous TID WC  . insulin aspart  0-5 Units Subcutaneous QHS  . insulin aspart  5 Units Subcutaneous TID AC  . insulin detemir  10 Units Subcutaneous QHS  . labetalol  400 mg Oral TID  . linaclotide  145 mcg Oral QAC breakfast  . polyethylene glycol  17 g Oral Daily  . sodium bicarbonate  650 mg Oral BID  . sodium chloride flush  3 mL Intravenous Q12H   Continuous Infusions: . sodium chloride       LOS: 4 days    Time spent: 30 minutes    Kathie Dike, MD [facility] Pager [phone]  If 7PM-7AM, please contact night-coverage [email or web address] Password [email or web address] 03/30/2018, 5:52 PM

## 2018-03-31 ENCOUNTER — Inpatient Hospital Stay (HOSPITAL_COMMUNITY): Payer: Medicare Other

## 2018-03-31 LAB — GLUCOSE, CAPILLARY
Glucose-Capillary: 156 mg/dL — ABNORMAL HIGH (ref 65–99)
Glucose-Capillary: 134 mg/dL — ABNORMAL HIGH (ref 65–99)
Glucose-Capillary: 172 mg/dL — ABNORMAL HIGH (ref 65–99)
Glucose-Capillary: 189 mg/dL — ABNORMAL HIGH (ref 65–99)

## 2018-03-31 LAB — BASIC METABOLIC PANEL
Anion gap: 11 (ref 5–15)
BUN: 40 mg/dL — ABNORMAL HIGH (ref 6–20)
CO2: 27 mmol/L (ref 22–32)
Calcium: 8.8 mg/dL — ABNORMAL LOW (ref 8.9–10.3)
Chloride: 99 mmol/L — ABNORMAL LOW (ref 101–111)
Creatinine, Ser: 2.65 mg/dL — ABNORMAL HIGH (ref 0.61–1.24)
GFR calc Af Amer: 31 mL/min — ABNORMAL LOW (ref >60)
GFR calc non Af Amer: 27 mL/min — ABNORMAL LOW (ref >60)
Glucose, Bld: 169 mg/dL — ABNORMAL HIGH (ref 65–99)
Potassium: 4.2 mmol/L (ref 3.5–5.1)
Sodium: 137 mmol/L (ref 135–145)

## 2018-03-31 MED ORDER — GUAIFENESIN ER 600 MG PO TB12
600.0000 mg | ORAL_TABLET | Freq: Two times a day (BID) | ORAL | Status: DC
  Administered 2018-03-31 – 2018-04-04 (×9): 600 mg via ORAL
  Filled 2018-03-31 (×9): qty 1

## 2018-03-31 MED ORDER — CLONIDINE HCL 0.2 MG PO TABS
0.3000 mg | ORAL_TABLET | Freq: Three times a day (TID) | ORAL | Status: DC
  Administered 2018-03-31 – 2018-04-04 (×13): 0.3 mg via ORAL
  Filled 2018-03-31 (×13): qty 1

## 2018-03-31 MED ORDER — BENZONATATE 100 MG PO CAPS
200.0000 mg | ORAL_CAPSULE | Freq: Three times a day (TID) | ORAL | Status: DC | PRN
  Administered 2018-03-31 – 2018-04-03 (×3): 200 mg via ORAL
  Filled 2018-03-31 (×3): qty 2

## 2018-03-31 NOTE — Plan of Care (Signed)
Nutrition Education Note  RD consulted for nutrition education regarding new onset CHF.  RD provided "Heart Failure Nutrition Therapy" handout from the Academy of [employer]. Reviewed patient's dietary recall:  B: [employer] or scrambled eggs/sausage or cereal with milk L: hamburger or sandwich or salad S: pork chop or grilled chicken or cold cuts, no sides Beverages: mostly water (16 cups/day), sometime 0.5 cup sweet tea or coke   Provided examples on ways to decrease sodium intake in diet: change salty meats for less salty meats, change one high sodium item per day, make dishes from scratch instead of buying frozen/prepared meals, reading food labels. Discouraged intake of processed foods and use of salt shaker: pt said he does not use salt shaker. Encouraged fresh fruits and vegetables as well as whole grain sources of carbohydrates to maximize fiber intake.   RD discussed why it is important for patient to adhere to diet recommendations, and emphasized the role of fluids, foods to avoid, and importance of weighing self daily. Teach back method used.   Discussed importance of cutting back on fluids on days when pt is not working outside.  Discussed importance of weighing daily to catch any potential problems before they escalate.    Expect FAIR/GOOD compliance.  Pt expressed willingness to change, but also apprehension towards the difficulty of changing.  Body mass index is 49.43 kg/m. Pt meets criteria for morbid obesity based on current BMI.  Current diet order is heart healthy/CHO modified, patient is consuming approximately 100% of meals at this time. Labs and medications reviewed. No further nutrition interventions warranted at this time.  If additional nutrition issues arise, please re-consult RD.

## 2018-03-31 NOTE — Progress Notes (Signed)
PROGRESS NOTE    Kelly Key  WJX:[id number] DOB: Sep 15, 1971 DOA: 03/26/2018 PCP: Neale Burly, MD    Brief Narrative:  47 year old male with history of long-standing hypertension, diastolic heart failure, presents to the hospital with worsening shortness of breath due to decompensated CHF.  He was admitted for IV diuresis.   Assessment & Plan:   Active Problems:   HTN (hypertension)   Morbid obesity (HCC)   Diabetes mellitus ([place])   CHF exacerbation (HCC)   Acute on chronic diastolic CHF (congestive heart failure) (HCC)   Acute respiratory failure with hypoxia ([place])   1. Acute respiratory failure with hypoxia.  Related to decompensated CHF. Still having episodes of shortness of breath and orthopnea.  Continue current treatments. 2. Acute on chronic diastolic congestive heart failure.  Echocardiogram shows normal ejection fraction with diastolic dysfunction.  Currently on IV Lasix, but still has evidence of volume overload and is still short of breath.  Weight is down approximately 9 pounds since admission.  Continue on intravenous Lasix.  Continue current treatments. 3. Hypertension, uncontrolled.  Continued on home dose of clonidine and labetalol.  Not a candidate for ACE inhibitors due to renal dysfunction.  He is refusing to take any further hydralazine.  Blood pressure has trended up this morning.  Will increase clonidine today. Will monitor and add Norvasc as needed. 4. Diabetes.  Blood sugar stable.  Continue on Levemir and NovoLog 5. Acute kidney injury on chronic kidney disease stage III.  Creatinine currently trending up.  Will likely need to tolerate a higher creatinine in order to achieve adequate diuresis. 6. Constipation.  Resolved with Linzess   DVT prophylaxis: Lovenox Code Status: Full code Family Communication: Discussed with GF at the bedside Disposition Plan: Discharge home once adequately diuresed   Consultants:   None  Procedures:  Echo:- Left  ventricle: The cavity size was normal. Wall thickness was   increased in a pattern of moderate LVH. Systolic function was   normal. The estimated ejection fraction was in the range of 60%   to 65%. Wall motion was normal; there were no regional wall   motion abnormalities. Findings consistent with left ventricular   diastolic dysfunction, grade indeterminate. Indeterminate filling    pressures.  Antimicrobials:   None   Subjective: Patient seen and evaluated this morning.  He continues to have orthopnea and cough with some mild sputum production this morning.  He continues to have some chest congestion, but overall feels that he is slowly improving.  He continues to have lower extremity edema.  Objective: Vitals:   03/30/18 2111 03/31/18 0249 03/31/18 0631 03/31/18 1426  BP:   (!) 187/95 (!) 155/92  Pulse:   83 80  Resp:   18 18  Temp:   98.3 F (36.8 C) 97.9 F (36.6 C)  TempSrc:   Oral Oral  SpO2: 97% 95% 93% 96%  Weight:   (!) 143.2 kg (315 lb 9.6 oz)   Height:        Intake/Output Summary (Last 24 hours) at 03/31/2018 1542 Last data filed at 03/31/2018 1300 Gross per 24 hour  Intake 840 ml  Output 2025 ml  Net -1185 ml   Filed Weights   03/29/18 0615 03/30/18 0559 03/31/18 0631  Weight: (!) 142.6 kg (314 lb 6.4 oz) (!) 142.2 kg (313 lb 7.9 oz) (!) 143.2 kg (315 lb 9.6 oz)    Examination:  General exam: Alert, awake, oriented x 3 Respiratory system: crackles bilaterally. Respiratory effort normal.  Cardiovascular system:RRR. No murmurs, rubs, gallops. 1+ edema bilaterally Gastrointestinal system: Abdomen is nondistended, soft and nontender. No organomegaly or masses felt. Normal bowel sounds heard. Central nervous system: Alert and oriented. No focal neurological deficits. Extremities: 1+ edema bilaterally Skin: No rashes, lesions or ulcers Psychiatry: Judgement and insight appear normal. Mood & affect appropriate.     Data Reviewed: I have personally reviewed  following labs and imaging studies  CBC: Recent Labs  Lab 03/26/18 0647 03/28/18 1252  WBC 10.7* 9.2  NEUTROABS 7.3  --   HGB 9.7* 9.6*  HCT 30.7* 30.7*  MCV 76.4* 76.9*  PLT 411* 703*   Basic Metabolic Panel: Recent Labs  Lab 03/26/18 0647 03/27/18 0707 03/28/18 1252 03/29/18 0936 03/30/18 0636  NA 135 136 135 134* 137  K 4.2 3.8 4.0 3.8 3.9  CL 99* 100* 99* 98* 98*  CO2 26 25 25 26 28   GLUCOSE 183* 191* 212* 221* 191*  BUN 43* 39* 42* 42* 43*  CREATININE 2.24* 2.28* 2.46* 2.66* 2.74*  CALCIUM 9.0 8.8* 8.9 8.7* 8.9   GFR: Estimated Creatinine Clearance: 44.7 mL/min (A) (by C-G formula based on SCr of 2.74 mg/dL (H)). Liver Function Tests: Recent Labs  Lab 03/26/18 0647  AST 17  ALT 26  ALKPHOS 85  BILITOT 0.6  PROT 7.6  ALBUMIN 3.0*   No results for input(s): LIPASE, AMYLASE in the last 168 hours. No results for input(s): AMMONIA in the last 168 hours. Coagulation Profile: No results for input(s): INR, PROTIME in the last 168 hours. Cardiac Enzymes: Recent Labs  Lab 03/26/18 0647 03/26/18 1735 03/26/18 2258 03/27/18 0707  TROPONINI 0.03* 0.03* 0.03* <0.03   BNP (last 3 results) No results for input(s): PROBNP in the last 8760 hours. HbA1C: No results for input(s): HGBA1C in the last 72 hours. CBG: Recent Labs  Lab 03/30/18 1144 03/30/18 1647 03/30/18 2035 03/31/18 0752 03/31/18 1146  GLUCAP 165* 140* 213* 156* 134*   Lipid Profile: No results for input(s): CHOL, HDL, LDLCALC, TRIG, CHOLHDL, LDLDIRECT in the last 72 hours. Thyroid Function Tests: No results for input(s): TSH, T4TOTAL, FREET4, T3FREE, THYROIDAB in the last 72 hours. Anemia Panel: No results for input(s): VITAMINB12, FOLATE, FERRITIN, TIBC, IRON, RETICCTPCT in the last 72 hours. Sepsis Labs: No results for input(s): PROCALCITON, LATICACIDVEN in the last 168 hours.  No results found for this or any previous visit (from the past 240 hour(s)).       Radiology  Studies: Dg Chest Port 1 View  Result Date: 03/31/2018 CLINICAL DATA:  Dyspnea, shortness of breath more yesterday than today, congestion, history acute diastolic congestive heart failure, diabetes mellitus, malignant hypertension EXAM: PORTABLE CHEST 1 VIEW COMPARISON:  Portable exam 1355 hours compared to 03/26/2018 FINDINGS: Enlargement of cardiac silhouette with pulmonary vascular congestion. Mediastinal contours normal. Mild perihilar infiltrate likely representing mild pulmonary edema. No segmental consolidation, pleural effusion, or pneumothorax. IMPRESSION: Mild CHF. Electronically Signed   By: Lavonia Dana M.D.   On: 03/31/2018 15:34        Scheduled Meds: . allopurinol  300 mg Oral Daily  . atorvastatin  80 mg Oral Daily  . cloNIDine  0.3 mg Oral TID  . enoxaparin (LOVENOX) injection  40 mg Subcutaneous Q24H  . furosemide  40 mg Intravenous BID  . guaiFENesin  600 mg Oral BID  . insulin aspart  0-15 Units Subcutaneous TID WC  . insulin aspart  0-5 Units Subcutaneous QHS  . insulin aspart  5 Units Subcutaneous TID AC  .  insulin detemir  10 Units Subcutaneous QHS  . labetalol  400 mg Oral TID  . linaclotide  145 mcg Oral QAC breakfast  . polyethylene glycol  17 g Oral Daily  . sodium bicarbonate  650 mg Oral BID  . sodium chloride flush  3 mL Intravenous Q12H   Continuous Infusions: . sodium chloride       LOS: 5 days    Time spent: 30 minutes    Fredi Geiler Darleen Crocker, DO [facility] Pager [phone]  If 7PM-7AM, please contact night-coverage [email or web address] Password [facility] 03/31/2018, 3:42 PM

## 2018-03-31 NOTE — Consult Note (Signed)
   [facility] CM Inpatient Consult   03/31/2018  Kelly Key Jul 23, 1971 [id number]   Referral received by inpatient case manager for [place] Management services and post hospital discharge follow up related to a diagnosis of HF and patient expressed knowledge deficit of HF. Patient was evaluated for community based chronic disease management services with [facility] care Management Program as a benefit of patient's Next [employer] Medicare. Called into patient's inpatient room to explain [place] Management services. Patient states he does not have a home scale and did not know he was supposed to be weighing daily until this admit. Patient stated he needed to know everything about heart failure. Patient expressed he was unable to afford his Linzess, which he stated was very effective for him. Patient stated he has no trouble with transportation. Verbal consent received for [place] and [place]. Patient gave [phone] as the best number to reach him. Patient will receive post hospital discharge calls and be evaluated for monthly home visits. [place] Management services does not interfere with or replace any services arranged by the inpatient care management team.  Made inpatient RNCM aware  [facility] will be following for care management. For additional questions please contact:   Tilden Broz RN, [facility] Liaison  [phone]) [employer] [phone]) Toll free office

## 2018-04-01 LAB — GLUCOSE, CAPILLARY
Glucose-Capillary: 172 mg/dL — ABNORMAL HIGH (ref 65–99)
Glucose-Capillary: 135 mg/dL — ABNORMAL HIGH (ref 65–99)
Glucose-Capillary: 133 mg/dL — ABNORMAL HIGH (ref 65–99)
Glucose-Capillary: 181 mg/dL — ABNORMAL HIGH (ref 65–99)

## 2018-04-01 LAB — CBC
HCT: 28.6 % — ABNORMAL LOW (ref 39.0–52.0)
Hemoglobin: 8.8 g/dL — ABNORMAL LOW (ref 13.0–17.0)
MCH: 23.7 pg — ABNORMAL LOW (ref 26.0–34.0)
MCHC: 30.8 g/dL (ref 30.0–36.0)
MCV: 77.1 fL — ABNORMAL LOW (ref 78.0–100.0)
Platelets: 397 10*3/uL (ref 150–400)
RBC: 3.71 MIL/uL — ABNORMAL LOW (ref 4.22–5.81)
RDW: 15.4 % (ref 11.5–15.5)
WBC: 8.7 10*3/uL (ref 4.0–10.5)

## 2018-04-01 LAB — BASIC METABOLIC PANEL
Anion gap: 10 (ref 5–15)
BUN: 35 mg/dL — ABNORMAL HIGH (ref 6–20)
CO2: 28 mmol/L (ref 22–32)
Calcium: 9 mg/dL (ref 8.9–10.3)
Chloride: 100 mmol/L — ABNORMAL LOW (ref 101–111)
Creatinine, Ser: 2.44 mg/dL — ABNORMAL HIGH (ref 0.61–1.24)
GFR calc Af Amer: 34 mL/min — ABNORMAL LOW (ref >60)
GFR calc non Af Amer: 29 mL/min — ABNORMAL LOW (ref >60)
Glucose, Bld: 174 mg/dL — ABNORMAL HIGH (ref 65–99)
Potassium: 3.7 mmol/L (ref 3.5–5.1)
Sodium: 138 mmol/L (ref 135–145)

## 2018-04-01 MED ORDER — FUROSEMIDE 10 MG/ML IJ SOLN
80.0000 mg | Freq: Two times a day (BID) | INTRAMUSCULAR | Status: DC
Start: 2018-04-01 — End: 2018-04-03
  Administered 2018-04-01 – 2018-04-03 (×4): 80 mg via INTRAVENOUS
  Filled 2018-04-01 (×4): qty 8

## 2018-04-01 MED ORDER — AMLODIPINE BESYLATE 5 MG PO TABS
5.0000 mg | ORAL_TABLET | Freq: Every day | ORAL | Status: DC
  Administered 2018-04-01 – 2018-04-04 (×4): 5 mg via ORAL
  Filled 2018-04-01 (×4): qty 1

## 2018-04-01 NOTE — Progress Notes (Signed)
PROGRESS NOTE    Kelly Key  TGY:[id number] DOB: 08-22-1971 DOA: 03/26/2018 PCP: Neale Burly, MD    Brief Narrative:  47 year old male with history of long-standing hypertension, diastolic heart failure, presents to the hospital with worsening shortness of breath due to decompensated CHF.  He was admitted for IV diuresis.   Assessment & Plan:   Active Problems:   HTN (hypertension)   Morbid obesity (HCC)   Diabetes mellitus ([place])   CHF exacerbation (HCC)   Acute on chronic diastolic CHF (congestive heart failure) (HCC)   Acute respiratory failure with hypoxia ([place])   1. Acute respiratory failure with hypoxia.  Related to decompensated CHF. Still having episodes of shortness of breath and orthopnea.  Continue current treatments. 2. Acute on chronic diastolic congestive heart failure.  Echocardiogram shows normal ejection fraction with diastolic dysfunction.  Currently on IV Lasix, but still has evidence of volume overload and is still short of breath.   He continues to have significant volume deficits noted with approximately 1.2 L yesterday and 1.8 L the day prior.  Unfortunately, clinically, he remains volume overloaded and may be poorly responding to his current dose of Lasix.  Will increase dose to 80 mg twice daily.  This was discussed with nephrology who will see the patient in consultation to help address his volume status especially in light of AKI on CKD stage III. 3. Hypertension, uncontrolled.  Continued on home dose of clonidine and labetalol.  Not a candidate for ACE inhibitors due to renal dysfunction.  He is refusing to take any further hydralazine.  Blood pressure has trended up once again this morning.  Will consider addition of Norvasc today pending repeat vitals. 4. Diabetes.  Blood sugar stable.  Continue on Levemir and NovoLog 5. Acute kidney injury on chronic kidney disease stage III.  Creatinine currently trending up.  Will likely need to tolerate a higher  creatinine in order to achieve adequate diuresis. 6. Constipation.  Resolved with Linzess   DVT prophylaxis: Lovenox Code Status: Full code Family Communication: Discussed with GF at the bedside yesterday Disposition Plan: Discharge home once adequately diuresed and further evaluation per nephrology   Consultants:   Nephrology  Procedures:  Echo:- Left ventricle: The cavity size was normal. Wall thickness was   increased in a pattern of moderate LVH. Systolic function was   normal. The estimated ejection fraction was in the range of 60%   to 65%. Wall motion was normal; there were no regional wall   motion abnormalities. Findings consistent with left ventricular   diastolic dysfunction, grade indeterminate. Indeterminate filling    pressures.  Antimicrobials:   None   Subjective: Patient seen and evaluated this morning.  He continues to have orthopnea and cough with some mild sputum production this morning.  His repeat chest x-ray does not demonstrate any signs of pneumonia and CBC does not have any leukocytosis.  Extremity edema is noted to be worsening despite negative fluid balance.  Objective: Vitals:   03/31/18 0631 03/31/18 1426 03/31/18 2118 04/01/18 1210  BP: (!) 187/95 (!) 155/92 (!) 165/113   Pulse: 83 80 78   Resp: 18 18 20    Temp: 98.3 F (36.8 C) 97.9 F (36.6 C) 97.7 F (36.5 C)   TempSrc: Oral Oral Oral   SpO2: 93% 96% 99% 95%  Weight: (!) 143.2 kg (315 lb 9.6 oz)  (!) 143.7 kg (316 lb 14.4 oz)   Height:        Intake/Output Summary (Last  24 hours) at 04/01/2018 1229 Last data filed at 04/01/2018 0747 Gross per 24 hour  Intake 480 ml  Output 1750 ml  Net -1270 ml   Filed Weights   03/30/18 0559 03/31/18 0631 03/31/18 2118  Weight: (!) 142.2 kg (313 lb 7.9 oz) (!) 143.2 kg (315 lb 9.6 oz) (!) 143.7 kg (316 lb 14.4 oz)    Examination:  General exam: Alert, awake, oriented x 3 Respiratory system: crackles bilaterally. Respiratory effort  normal. Cardiovascular system:RRR. No murmurs, rubs, gallops. 2+ edema bilaterally Gastrointestinal system: Abdomen is nondistended, soft and nontender. No organomegaly or masses felt. Normal bowel sounds heard. Central nervous system: Alert and oriented. No focal neurological deficits. Extremities: 1-2+ edema bilaterally Skin: No rashes, lesions or ulcers Psychiatry: Judgement and insight appear normal. Mood & affect appropriate.     Data Reviewed: I have personally reviewed following labs and imaging studies  CBC: Recent Labs  Lab 03/26/18 0647 03/28/18 1252 04/01/18 1038  WBC 10.7* 9.2 8.7  NEUTROABS 7.3  --   --   HGB 9.7* 9.6* 8.8*  HCT 30.7* 30.7* 28.6*  MCV 76.4* 76.9* 77.1*  PLT 411* 405* 921   Basic Metabolic Panel: Recent Labs  Lab 03/28/18 1252 03/29/18 0936 03/30/18 0636 03/31/18 1651 04/01/18 1038  NA 135 134* 137 137 138  K 4.0 3.8 3.9 4.2 3.7  CL 99* 98* 98* 99* 100*  CO2 25 26 28 27 28   GLUCOSE 212* 221* 191* 169* 174*  BUN 42* 42* 43* 40* 35*  CREATININE 2.46* 2.66* 2.74* 2.65* 2.44*  CALCIUM 8.9 8.7* 8.9 8.8* 9.0   GFR: Estimated Creatinine Clearance: 50.3 mL/min (A) (by C-G formula based on SCr of 2.44 mg/dL (H)). Liver Function Tests: Recent Labs  Lab 03/26/18 0647  AST 17  ALT 26  ALKPHOS 85  BILITOT 0.6  PROT 7.6  ALBUMIN 3.0*   No results for input(s): LIPASE, AMYLASE in the last 168 hours. No results for input(s): AMMONIA in the last 168 hours. Coagulation Profile: No results for input(s): INR, PROTIME in the last 168 hours. Cardiac Enzymes: Recent Labs  Lab 03/26/18 0647 03/26/18 1735 03/26/18 2258 03/27/18 0707  TROPONINI 0.03* 0.03* 0.03* <0.03   BNP (last 3 results) No results for input(s): PROBNP in the last 8760 hours. HbA1C: No results for input(s): HGBA1C in the last 72 hours. CBG: Recent Labs  Lab 03/31/18 1146 03/31/18 1656 03/31/18 2035 04/01/18 0733 04/01/18 1153  GLUCAP 134* 172* 189* 172* 135*    Lipid Profile: No results for input(s): CHOL, HDL, LDLCALC, TRIG, CHOLHDL, LDLDIRECT in the last 72 hours. Thyroid Function Tests: No results for input(s): TSH, T4TOTAL, FREET4, T3FREE, THYROIDAB in the last 72 hours. Anemia Panel: No results for input(s): VITAMINB12, FOLATE, FERRITIN, TIBC, IRON, RETICCTPCT in the last 72 hours. Sepsis Labs: No results for input(s): PROCALCITON, LATICACIDVEN in the last 168 hours.  No results found for this or any previous visit (from the past 240 hour(s)).       Radiology Studies: Dg Chest Port 1 View  Result Date: 03/31/2018 CLINICAL DATA:  Dyspnea, shortness of breath more yesterday than today, congestion, history acute diastolic congestive heart failure, diabetes mellitus, malignant hypertension EXAM: PORTABLE CHEST 1 VIEW COMPARISON:  Portable exam 1355 hours compared to 03/26/2018 FINDINGS: Enlargement of cardiac silhouette with pulmonary vascular congestion. Mediastinal contours normal. Mild perihilar infiltrate likely representing mild pulmonary edema. No segmental consolidation, pleural effusion, or pneumothorax. IMPRESSION: Mild CHF. Electronically Signed   By: Crist Infante.D.  On: 03/31/2018 15:34        Scheduled Meds: . allopurinol  300 mg Oral Daily  . atorvastatin  80 mg Oral Daily  . cloNIDine  0.3 mg Oral TID  . enoxaparin (LOVENOX) injection  40 mg Subcutaneous Q24H  . furosemide  80 mg Intravenous BID  . guaiFENesin  600 mg Oral BID  . insulin aspart  0-15 Units Subcutaneous TID WC  . insulin aspart  0-5 Units Subcutaneous QHS  . insulin aspart  5 Units Subcutaneous TID AC  . insulin detemir  10 Units Subcutaneous QHS  . labetalol  400 mg Oral TID  . linaclotide  145 mcg Oral QAC breakfast  . polyethylene glycol  17 g Oral Daily  . sodium bicarbonate  650 mg Oral BID  . sodium chloride flush  3 mL Intravenous Q12H   Continuous Infusions: . sodium chloride       LOS: 6 days    Time spent: 30  minutes    Pratik Darleen Crocker, DO [facility] Pager [phone]  If 7PM-7AM, please contact night-coverage [email or web address] Password [facility] 04/01/2018, 12:29 PM

## 2018-04-01 NOTE — Care Management Note (Signed)
Case Management Note  Patient Details  Name: Kelly Key MRN: [id number] Date of Birth: 22-Feb-1971  Subjective/Objective:    Admitted with CHF. Pt reports being from home, lives with his girlfriend. He is ind with ADL's. He drives, he has PCP and insurance with drug coverage. He reports taking his medications as prescribed. He says he has been in and out of [facility] with his CHF.  He has very low health literacy in regards to his CHF. He was unaware he should be weighing himself daily. He was unaware he should be modifying his diet. He did not realize he must do more than take his medications to manage his chronic illness. Pt seems very eager to learn. Pt says he has never been referred to a cardiologist.                 Action/Plan: Pt will DC home, unsure if [facility] is appropriate at this time as pt is not homebound. Referral made to [facility] and pt has accepted services.   Pt will need scale at DC.  Pt referred for dietician consult for education.  Pt's heart failure education packet laying in room. CM went through book with patient (after verifying he is literate). CM encouraged pt to ready through book while sitting in hospital.   Pt unhappy with PCP and will be provided list of new providers accepting pt's in [place].   CM will cont to follow.  Discharge planning Services  CM Consult  Status of Service:  In process, will continue to follow  If discussed at Long Length of Stay Meetings, dates discussed:    Additional Comments:  Sherald Barge, RN 04/01/2018, 3:10 PM

## 2018-04-02 ENCOUNTER — Inpatient Hospital Stay (HOSPITAL_COMMUNITY): Payer: Medicare Other

## 2018-04-02 ENCOUNTER — Other Ambulatory Visit: Payer: Self-pay

## 2018-04-02 LAB — BASIC METABOLIC PANEL
Anion gap: 11 (ref 5–15)
BUN: 32 mg/dL — ABNORMAL HIGH (ref 6–20)
CO2: 28 mmol/L (ref 22–32)
Calcium: 9 mg/dL (ref 8.9–10.3)
Chloride: 99 mmol/L — ABNORMAL LOW (ref 101–111)
Creatinine, Ser: 2.32 mg/dL — ABNORMAL HIGH (ref 0.61–1.24)
GFR calc Af Amer: 36 mL/min — ABNORMAL LOW (ref >60)
GFR calc non Af Amer: 31 mL/min — ABNORMAL LOW (ref >60)
Glucose, Bld: 186 mg/dL — ABNORMAL HIGH (ref 65–99)
Potassium: 3.7 mmol/L (ref 3.5–5.1)
Sodium: 138 mmol/L (ref 135–145)

## 2018-04-02 LAB — CBC
HCT: 28.2 % — ABNORMAL LOW (ref 39.0–52.0)
Hemoglobin: 8.8 g/dL — ABNORMAL LOW (ref 13.0–17.0)
MCH: 24.1 pg — ABNORMAL LOW (ref 26.0–34.0)
MCHC: 31.2 g/dL (ref 30.0–36.0)
MCV: 77.3 fL — ABNORMAL LOW (ref 78.0–100.0)
Platelets: 422 10*3/uL — ABNORMAL HIGH (ref 150–400)
RBC: 3.65 MIL/uL — ABNORMAL LOW (ref 4.22–5.81)
RDW: 15.4 % (ref 11.5–15.5)
WBC: 9.8 10*3/uL (ref 4.0–10.5)

## 2018-04-02 LAB — GLUCOSE, CAPILLARY
Glucose-Capillary: 170 mg/dL — ABNORMAL HIGH (ref 65–99)
Glucose-Capillary: 190 mg/dL — ABNORMAL HIGH (ref 65–99)
Glucose-Capillary: 141 mg/dL — ABNORMAL HIGH (ref 65–99)
Glucose-Capillary: 149 mg/dL — ABNORMAL HIGH (ref 65–99)

## 2018-04-02 LAB — MAGNESIUM: Magnesium: 1.8 mg/dL (ref 1.7–2.4)

## 2018-04-02 MED ORDER — FLUTICASONE PROPIONATE 50 MCG/ACT NA SUSP
1.0000 | Freq: Every day | NASAL | Status: DC
  Administered 2018-04-02 – 2018-04-04 (×3): 1 via NASAL
  Filled 2018-04-02: qty 16

## 2018-04-02 NOTE — Plan of Care (Signed)
Continue with regimen as planned

## 2018-04-02 NOTE — Consult Note (Signed)
Reason for Consult: Renal failure and leg swelling Referring Physician: Dr. [name] is an 47 y.o. male.  HPI: He is a patient with long-standing history of diabetes, hypertension, gout presently came with complaints of increased leg swelling and difficulty breathing for the last couple of weeks.  According the patient he had swelling of the legs and difficulty breathing about 3 to 4 weeks ago.  He was [facility], hospital and stay for a week without significant improvement.  Finally patient signed [place] and went home.  Since he continued to have a problem he came here.  Presently he is feeling somewhat better.  Patient denies any previous history of renal failure or kidney stone.  However patient seems to have been work-up for renal artery stenosis in 2007 where captopril enhanced nuclear scan was done which was negative.  Patient presently does not have any nausea or vomiting.  Past Medical History:  Diagnosis Date  . Acute diastolic CHF (congestive heart failure) ([place]) 06/11/2012   EF 50-55% [facility])  . Diabetes mellitus    A1c 11.5 (06/11/2012).  . Gout   . Hepatic steatosis 06/11/2012   Elevated LFTs  . Hyperlipemia   . Malignant hypertension   . Microcytic anemia 06/12/2012  . Obesity     History reviewed. No pertinent surgical history.  No family history on file.  Social History:  reports that he has never smoked. He has never used smokeless tobacco. He reports that he does not drink alcohol or use drugs.  Allergies:  Allergies  Allergen Reactions  . Lisinopril Swelling    Medications: I have reviewed the patient's current medications.  Results for orders placed or performed during the hospital encounter of 03/26/18 (from the past 48 hour(s))  Glucose, capillary     Status: Abnormal   Collection Time: 03/31/18 11:46 AM  Result Value Ref Range   Glucose-Capillary 134 (H) 65 - 99 mg/dL   Comment 1 Notify RN   Basic metabolic  panel     Status: Abnormal   Collection Time: 03/31/18  4:51 PM  Result Value Ref Range   Sodium 137 135 - 145 mmol/L   Potassium 4.2 3.5 - 5.1 mmol/L   Chloride 99 (L) 101 - 111 mmol/L   CO2 27 22 - 32 mmol/L   Glucose, Bld 169 (H) 65 - 99 mg/dL   BUN 40 (H) 6 - 20 mg/dL   Creatinine, Ser 2.65 (H) 0.61 - 1.24 mg/dL   Calcium 8.8 (L) 8.9 - 10.3 mg/dL   GFR calc non Af Amer 27 (L) >60 mL/min   GFR calc Af Amer 31 (L) >60 mL/min    Comment: (NOTE) The eGFR has been calculated using the CKD EPI equation. This calculation has not been validated in all clinical situations. eGFR's persistently <60 mL/min signify possible Chronic Kidney Disease.    Anion gap 11 5 - 15    Comment: Performed at [facility], [address] [place], [place] [address]  Glucose, capillary     Status: Abnormal   Collection Time: 03/31/18  4:56 PM  Result Value Ref Range   Glucose-Capillary 172 (H) 65 - 99 mg/dL  Glucose, capillary     Status: Abnormal   Collection Time: 03/31/18  8:35 PM  Result Value Ref Range   Glucose-Capillary 189 (H) 65 - 99 mg/dL   Comment 1 Notify RN    Comment 2 Document in Chart   Glucose, capillary  Status: Abnormal   Collection Time: 04/01/18  7:33 AM  Result Value Ref Range   Glucose-Capillary 172 (H) 65 - 99 mg/dL   Comment 1 Notify RN   Basic metabolic panel     Status: Abnormal   Collection Time: 04/01/18 10:38 AM  Result Value Ref Range   Sodium 138 135 - 145 mmol/L   Potassium 3.7 3.5 - 5.1 mmol/L   Chloride 100 (L) 101 - 111 mmol/L   CO2 28 22 - 32 mmol/L   Glucose, Bld 174 (H) 65 - 99 mg/dL   BUN 35 (H) 6 - 20 mg/dL   Creatinine, Ser 2.44 (H) 0.61 - 1.24 mg/dL   Calcium 9.0 8.9 - 10.3 mg/dL   GFR calc non Af Amer 29 (L) >60 mL/min   GFR calc Af Amer 34 (L) >60 mL/min    Comment: (NOTE) The eGFR has been calculated using the CKD EPI equation. This calculation has not been validated in all clinical situations. eGFR's persistently <60 mL/min signify possible  Chronic Kidney Disease.    Anion gap 10 5 - 15    Comment: Performed at [facility], [address], [place], [place] [address]  CBC     Status: Abnormal   Collection Time: 04/01/18 10:38 AM  Result Value Ref Range   WBC 8.7 4.0 - 10.5 K/uL   RBC 3.71 (L) 4.22 - 5.81 MIL/uL   Hemoglobin 8.8 (L) 13.0 - 17.0 g/dL   HCT 28.6 (L) 39.0 - 52.0 %   MCV 77.1 (L) 78.0 - 100.0 fL   MCH 23.7 (L) 26.0 - 34.0 pg   MCHC 30.8 30.0 - 36.0 g/dL   RDW 15.4 11.5 - 15.5 %   Platelets 397 150 - 400 K/uL    Comment: Performed at [facility], [address], [place], [place] [address]  Glucose, capillary     Status: Abnormal   Collection Time: 04/01/18 11:53 AM  Result Value Ref Range   Glucose-Capillary 135 (H) 65 - 99 mg/dL  Glucose, capillary     Status: Abnormal   Collection Time: 04/01/18  4:11 PM  Result Value Ref Range   Glucose-Capillary 133 (H) 65 - 99 mg/dL   Comment 1 Notify RN    Comment 2 Document in Chart   Glucose, capillary     Status: Abnormal   Collection Time: 04/01/18 10:17 PM  Result Value Ref Range   Glucose-Capillary 181 (H) 65 - 99 mg/dL   Comment 1 Notify RN    Comment 2 Document in Chart   Glucose, capillary     Status: Abnormal   Collection Time: 04/02/18  7:40 AM  Result Value Ref Range   Glucose-Capillary 170 (H) 65 - 99 mg/dL   Comment 1 Notify RN    Comment 2 Document in Chart     Dg Chest Port 1 View  Result Date: 03/31/2018 CLINICAL DATA:  Dyspnea, shortness of breath more yesterday than today, congestion, history acute diastolic congestive heart failure, diabetes mellitus, malignant hypertension EXAM: PORTABLE CHEST 1 VIEW COMPARISON:  Portable exam 1355 hours compared to 03/26/2018 FINDINGS: Enlargement of cardiac silhouette with pulmonary vascular congestion. Mediastinal contours normal. Mild perihilar infiltrate likely representing mild pulmonary edema. No segmental consolidation, pleural effusion, or pneumothorax. IMPRESSION: Mild CHF. Electronically Signed    By: Lavonia Dana M.D.   On: 03/31/2018 15:34    Review of Systems  Constitutional: Negative for chills and fever.  Respiratory: Positive for shortness of breath.   Cardiovascular: Positive for  orthopnea and leg swelling. Negative for chest pain and palpitations.  Gastrointestinal: Negative for nausea and vomiting.   Blood pressure (!) 168/94, pulse 85, temperature 97.9 F (36.6 C), temperature source Oral, resp. rate 18, height 5' 7"  (1.702 m), weight (!) 141.2 kg (311 lb 4.6 oz), SpO2 90 %. Physical Exam  Constitutional: He is oriented to person, place, and time. No distress.  Eyes: No scleral icterus.  Neck: No JVD present.  Cardiovascular: Normal rate and regular rhythm.  No murmur heard. Respiratory: No respiratory distress. He has no wheezes. He has rales.  GI: He exhibits no distension. There is no tenderness. There is no rebound.  Musculoskeletal: He exhibits edema. He exhibits no tenderness.  Neurological: He is alert and oriented to person, place, and time.    Assessment/Plan: 1] renal failure: Possibly chronic.  Patient has creatinine of 1.18 milligrams per deciliter with EGFR of 86 cc/min per 1.73 on 06/12/2012.  He had captopril denounced renal scan in 2007 which showed right kidney to be 11 and left kidney to be 13.2 hence on a chronic kidney indicating possible previous damage.  Etiology has this moment is not clear.  That study did not show any significant renal artery stenosis.  Hence patient might have chronic kidney injury from diabetes/hypertension/CIN/obesity related glomerulopathy.  The present increasing creatinine could be secondary to natural progression of his disease.  However superimposed acute kidney injury cannot be ruled out 2] proteinuria: The only available UA was from 2017 which showed 100 mg/dL of protein.  This could be secondary to diabetes/hypertension/obesity related glomerulopathy. 3] anasarca: At this moment no sure whether we are dealing with nephrotic  syndrome or not.  Patient has significant swelling and with low albumin.  He is on Lasix his urine output has improved.  But still patient has leg swelling but denies any difficulty breathing. 4] diabetes 5] hypertension: His blood pressure is reasonably controlled 6] history of hepatic steatosis: His liver function tests is okay at this moment but he had previous elevation 7] history of gout 8] anemia: Plan: 1] agree with diuretics 2] we will check ANA, complement, hepatitis B surface antigen, hepatitis C antibody, 3] we will do 24-hour urine for protein and immunoelectrophoresis 4] we will check his renal panel in the morning  Kelly Key S 04/02/2018, 8:30 AM

## 2018-04-02 NOTE — Progress Notes (Signed)
PROGRESS NOTE    Kelly Key  FSE:[id number] DOB: Mar 14, 1971 DOA: 03/26/2018 PCP: Neale Burly, MD    Brief Narrative:  47 year old male with history of long-standing hypertension, diastolic heart failure, presents to the hospital with worsening shortness of breath due to decompensated CHF.  He was admitted for IV diuresis.   Assessment & Plan:   Active Problems:   HTN (hypertension)   Morbid obesity (HCC)   Diabetes mellitus ([place])   CHF exacerbation (HCC)   Acute on chronic diastolic CHF (congestive heart failure) (HCC)   Acute respiratory failure with hypoxia ([place])   1. Acute respiratory failure with hypoxia.  Related to decompensated CHF. Still having episodes of shortness of breath and orthopnea-but is improved with aggressive diuresis. 2. Acute on chronic diastolic congestive heart failure.  Echocardiogram shows normal ejection fraction with diastolic dysfunction.  Currently on IV Lasix which has been increased to 80mg  bid with improvement.  3. Hypertension-improved control.  Continue current med regimen. 4. Diabetes.  Blood sugar stable.  Continue on Levemir and NovoLog 5. Acute kidney injury on chronic kidney disease stage III.  Creatinine currently stable. Appreciate further Nephrology evaluation with studies as ordered. 6. Constipation.  Resolved with Linzess   DVT prophylaxis: Lovenox Code Status: Full code Family Communication: Discussed with GF at the bedside yesterday Disposition Plan: Discharge home once adequately diuresed and further evaluation per nephrology   Consultants:   Nephrology  Procedures:  Echo:- Left ventricle: The cavity size was normal. Wall thickness was   increased in a pattern of moderate LVH. Systolic function was   normal. The estimated ejection fraction was in the range of 60%   to 65%. Wall motion was normal; there were no regional wall   motion abnormalities. Findings consistent with left ventricular   diastolic dysfunction, grade  indeterminate. Indeterminate filling    pressures.  Antimicrobials:   None   Subjective: Patient seen and evaluated this morning.  He dates that his orthopnea and shortness of breath have improved somewhat.  His swelling in his lower extremities have also improved.  He continues to have a cough.  Objective: Vitals:   04/01/18 2221 04/02/18 0548 04/02/18 0552 04/02/18 0700  BP: (!) 166/92  (!) 168/94   Pulse: 85  85   Resp: (!) 24  18   Temp: 97.7 F (36.5 C)  97.9 F (36.6 C)   TempSrc: Oral  Oral   SpO2: 94%  90%   Weight:  (!) 143.7 kg (316 lb 13.2 oz)  (!) 141.2 kg (311 lb 4.6 oz)  Height:        Intake/Output Summary (Last 24 hours) at 04/02/2018 1130 Last data filed at 04/02/2018 1000 Gross per 24 hour  Intake 840 ml  Output 2750 ml  Net -1910 ml   Filed Weights   03/31/18 2118 04/02/18 0548 04/02/18 0700  Weight: (!) 143.7 kg (316 lb 14.4 oz) (!) 143.7 kg (316 lb 13.2 oz) (!) 141.2 kg (311 lb 4.6 oz)    Examination:  General exam: Alert, awake, oriented x 3 Respiratory system: crackles bilaterally. Respiratory effort normal. Cardiovascular system:RRR. No murmurs, rubs, gallops. 2+ edema bilaterally Gastrointestinal system: Abdomen is nondistended, soft and nontender. No organomegaly or masses felt. Normal bowel sounds heard. Central nervous system: Alert and oriented. No focal neurological deficits. Extremities: 1-2+ edema bilaterally-improved Skin: No rashes, lesions or ulcers Psychiatry: Judgement and insight appear normal. Mood & affect appropriate.     Data Reviewed: I have personally reviewed following labs  and imaging studies  CBC: Recent Labs  Lab 03/28/18 1252 04/01/18 1038 04/02/18 1000  WBC 9.2 8.7 9.8  HGB 9.6* 8.8* 8.8*  HCT 30.7* 28.6* 28.2*  MCV 76.9* 77.1* 77.3*  PLT 405* 397 825*   Basic Metabolic Panel: Recent Labs  Lab 03/29/18 0936 03/30/18 0636 03/31/18 1651 04/01/18 1038 04/02/18 1000  NA 134* 137 137 138 138  K 3.8 3.9  4.2 3.7 3.7  CL 98* 98* 99* 100* 99*  CO2 26 28 27 28 28   GLUCOSE 221* 191* 169* 174* 186*  BUN 42* 43* 40* 35* 32*  CREATININE 2.66* 2.74* 2.65* 2.44* 2.32*  CALCIUM 8.7* 8.9 8.8* 9.0 9.0  MG  --   --   --   --  1.8   GFR: Estimated Creatinine Clearance: 52.4 mL/min (A) (by C-G formula based on SCr of 2.32 mg/dL (H)). Liver Function Tests: No results for input(s): AST, ALT, ALKPHOS, BILITOT, PROT, ALBUMIN in the last 168 hours. No results for input(s): LIPASE, AMYLASE in the last 168 hours. No results for input(s): AMMONIA in the last 168 hours. Coagulation Profile: No results for input(s): INR, PROTIME in the last 168 hours. Cardiac Enzymes: Recent Labs  Lab 03/26/18 1735 03/26/18 2258 03/27/18 0707  TROPONINI 0.03* 0.03* <0.03   BNP (last 3 results) No results for input(s): PROBNP in the last 8760 hours. HbA1C: No results for input(s): HGBA1C in the last 72 hours. CBG: Recent Labs  Lab 04/01/18 0733 04/01/18 1153 04/01/18 1611 04/01/18 2217 04/02/18 0740  GLUCAP 172* 135* 133* 181* 170*   Lipid Profile: No results for input(s): CHOL, HDL, LDLCALC, TRIG, CHOLHDL, LDLDIRECT in the last 72 hours. Thyroid Function Tests: No results for input(s): TSH, T4TOTAL, FREET4, T3FREE, THYROIDAB in the last 72 hours. Anemia Panel: No results for input(s): VITAMINB12, FOLATE, FERRITIN, TIBC, IRON, RETICCTPCT in the last 72 hours. Sepsis Labs: No results for input(s): PROCALCITON, LATICACIDVEN in the last 168 hours.  No results found for this or any previous visit (from the past 240 hour(s)).       Radiology Studies: Dg Chest Port 1 View  Result Date: 03/31/2018 CLINICAL DATA:  Dyspnea, shortness of breath more yesterday than today, congestion, history acute diastolic congestive heart failure, diabetes mellitus, malignant hypertension EXAM: PORTABLE CHEST 1 VIEW COMPARISON:  Portable exam 1355 hours compared to 03/26/2018 FINDINGS: Enlargement of cardiac silhouette with  pulmonary vascular congestion. Mediastinal contours normal. Mild perihilar infiltrate likely representing mild pulmonary edema. No segmental consolidation, pleural effusion, or pneumothorax. IMPRESSION: Mild CHF. Electronically Signed   By: Lavonia Dana M.D.   On: 03/31/2018 15:34        Scheduled Meds: . allopurinol  300 mg Oral Daily  . amLODipine  5 mg Oral Daily  . atorvastatin  80 mg Oral Daily  . cloNIDine  0.3 mg Oral TID  . enoxaparin (LOVENOX) injection  40 mg Subcutaneous Q24H  . furosemide  80 mg Intravenous BID  . guaiFENesin  600 mg Oral BID  . insulin aspart  0-15 Units Subcutaneous TID WC  . insulin aspart  0-5 Units Subcutaneous QHS  . insulin aspart  5 Units Subcutaneous TID AC  . insulin detemir  10 Units Subcutaneous QHS  . labetalol  400 mg Oral TID  . linaclotide  145 mcg Oral QAC breakfast  . polyethylene glycol  17 g Oral Daily  . sodium bicarbonate  650 mg Oral BID  . sodium chloride flush  3 mL Intravenous Q12H   Continuous Infusions: .  sodium chloride       LOS: 7 days    Time spent: 30 minutes    Brad Lieurance Darleen Crocker, DO [facility] Pager [phone]  If 7PM-7AM, please contact night-coverage [email or web address] Password [email or web address] 04/02/2018, 11:30 AM

## 2018-04-02 NOTE — Patient Outreach (Signed)
[facility]) Care Management  04/02/2018  Kelly Key [birth year] [id number]     This encounter was created in error - please disregard.

## 2018-04-02 NOTE — Care Management Important Message (Signed)
Important Message  Patient Details  Name: Kelly Key MRN: [id number] Date of Birth: May 28, 1971   Medicare Important Message Given:  Yes    [name] 04/02/2018, 12:25 PM

## 2018-04-03 LAB — PROTEIN, URINE, 24 HOUR
Collection Interval-UPROT: 24 hours
Protein, 24H Urine: 6279 mg/d — ABNORMAL HIGH (ref 50–100)
Protein, Urine: 161 mg/dL
Urine Total Volume-UPROT: 3900 mL

## 2018-04-03 LAB — RENAL FUNCTION PANEL
Albumin: 2.8 g/dL — ABNORMAL LOW (ref 3.5–5.0)
Anion gap: 12 (ref 5–15)
BUN: 30 mg/dL — ABNORMAL HIGH (ref 6–20)
CO2: 26 mmol/L (ref 22–32)
Calcium: 9.1 mg/dL (ref 8.9–10.3)
Chloride: 99 mmol/L — ABNORMAL LOW (ref 101–111)
Creatinine, Ser: 2.21 mg/dL — ABNORMAL HIGH (ref 0.61–1.24)
GFR calc Af Amer: 38 mL/min — ABNORMAL LOW (ref >60)
GFR calc non Af Amer: 33 mL/min — ABNORMAL LOW (ref >60)
Glucose, Bld: 160 mg/dL — ABNORMAL HIGH (ref 65–99)
Phosphorus: 4.1 mg/dL (ref 2.5–4.6)
Potassium: 3.7 mmol/L (ref 3.5–5.1)
Sodium: 137 mmol/L (ref 135–145)

## 2018-04-03 LAB — GLUCOSE, CAPILLARY
Glucose-Capillary: 156 mg/dL — ABNORMAL HIGH (ref 65–99)
Glucose-Capillary: 168 mg/dL — ABNORMAL HIGH (ref 65–99)
Glucose-Capillary: 165 mg/dL — ABNORMAL HIGH (ref 65–99)
Glucose-Capillary: 189 mg/dL — ABNORMAL HIGH (ref 65–99)

## 2018-04-03 LAB — HEPATITIS C ANTIBODY: HCV Ab: <0.1 s/co ratio (ref 0.0–0.9)

## 2018-04-03 LAB — MPO/PR-3 (ANCA) ANTIBODIES
ANCA Proteinase 3: <3.5 U/mL (ref 0.0–3.5)
Myeloperoxidase Abs: <9 U/mL (ref 0.0–9.0)

## 2018-04-03 LAB — CBC
HCT: 29.4 % — ABNORMAL LOW (ref 39.0–52.0)
Hemoglobin: 9 g/dL — ABNORMAL LOW (ref 13.0–17.0)
MCH: 23.6 pg — ABNORMAL LOW (ref 26.0–34.0)
MCHC: 30.6 g/dL (ref 30.0–36.0)
MCV: 77 fL — ABNORMAL LOW (ref 78.0–100.0)
Platelets: 446 10*3/uL — ABNORMAL HIGH (ref 150–400)
RBC: 3.82 MIL/uL — ABNORMAL LOW (ref 4.22–5.81)
RDW: 15.4 % (ref 11.5–15.5)
WBC: 7.9 10*3/uL (ref 4.0–10.5)

## 2018-04-03 LAB — HEPATITIS B SURFACE ANTIGEN: Hepatitis B Surface Ag: NEGATIVE

## 2018-04-03 LAB — C4 COMPLEMENT: Complement C4, Body Fluid: 46 mg/dL — ABNORMAL HIGH (ref 14–44)

## 2018-04-03 LAB — C3 COMPLEMENT: C3 Complement: 175 mg/dL — ABNORMAL HIGH (ref 82–167)

## 2018-04-03 MED ORDER — TORSEMIDE 20 MG PO TABS
60.0000 mg | ORAL_TABLET | Freq: Every day | ORAL | Status: DC
  Administered 2018-04-03: 60 mg via ORAL
  Filled 2018-04-03: qty 3

## 2018-04-03 NOTE — Progress Notes (Signed)
PROGRESS NOTE    SHERROD TOOTHMAN  NAT:[id number] DOB: 11-10-1970 DOA: 03/26/2018 PCP: Neale Burly, MD    Brief Narrative:  47 year old male with history of long-standing hypertension, diastolic heart failure, presents to the hospital with worsening shortness of breath due to decompensated CHF.  He was admitted for IV diuresis.   Assessment & Plan:   Active Problems:   HTN (hypertension)   Morbid obesity (HCC)   Diabetes mellitus ([place])   CHF exacerbation (HCC)   Acute on chronic diastolic CHF (congestive heart failure) (HCC)   Acute respiratory failure with hypoxia ([place])   1. Acute respiratory failure with hypoxia.  Related to decompensated CHF. Still having episodes of shortness of breath and orthopnea-but is improved with aggressive diuresis. 2. Acute on chronic diastolic congestive heart failure.  Echocardiogram shows normal ejection fraction with diastolic dysfunction. Switched to 60mg  Demadex daily by Nephrology. Continue 24 hour urine collection and monitor. 3. Hypertension-improved control.  Continue current med regimen. 4. Diabetes.  Blood sugar stable.  Continue on Levemir and NovoLog 5. Acute kidney injury on chronic kidney disease stage III.  Creatinine currently stable. Appreciate further Nephrology evaluation with studies as ordered. Renal U/S WNL. 6. Constipation.  Resolved with Linzess   DVT prophylaxis: Lovenox Code Status: Full code Family Communication: Discussed with GF at the bedside yesterday Disposition Plan: Discharge home once adequately diuresed and further evaluation per nephrology   Consultants:   Nephrology  Procedures:  Echo:- Left ventricle: The cavity size was normal. Wall thickness was   increased in a pattern of moderate LVH. Systolic function was   normal. The estimated ejection fraction was in the range of 60%   to 65%. Wall motion was normal; there were no regional wall   motion abnormalities. Findings consistent with left ventricular  diastolic dysfunction, grade indeterminate. Indeterminate filling    pressures.  Antimicrobials:   None   Subjective: Patient seen and evaluated this morning.  He has improved symptoms of leg swelling and dyspnea.  Objective: Vitals:   04/02/18 0700 04/02/18 1404 04/02/18 2048 04/03/18 0433  BP:  (!) 157/85 (!) 153/92 (!) 153/65  Pulse:  77 77 79  Resp:  18  19  Temp:  (!) 97.1 F (36.2 C) (!) 97.5 F (36.4 C) 97.9 F (36.6 C)  TempSrc:   Oral Oral  SpO2:  95% 95% 96%  Weight: (!) 141.2 kg (311 lb 4.6 oz)   (!) 140.3 kg (309 lb 4.9 oz)  Height:        Intake/Output Summary (Last 24 hours) at 04/03/2018 1108 Last data filed at 04/03/2018 0600 Gross per 24 hour  Intake 720 ml  Output 1700 ml  Net -980 ml   Filed Weights   04/02/18 0548 04/02/18 0700 04/03/18 0433  Weight: (!) 143.7 kg (316 lb 13.2 oz) (!) 141.2 kg (311 lb 4.6 oz) (!) 140.3 kg (309 lb 4.9 oz)    Examination:  General exam: Alert, awake, oriented x 3 Respiratory system: crackles bilaterally. Respiratory effort normal. Cardiovascular system:RRR. No murmurs, rubs, gallops. 2+ edema bilaterally Gastrointestinal system: Abdomen is nondistended, soft and nontender. No organomegaly or masses felt. Normal bowel sounds heard. Central nervous system: Alert and oriented. No focal neurological deficits. Extremities: 1-2+ edema bilaterally-improved Skin: No rashes, lesions or ulcers Psychiatry: Judgement and insight appear normal. Mood & affect appropriate.     Data Reviewed: I have personally reviewed following labs and imaging studies  CBC: Recent Labs  Lab 03/28/18 1252 04/01/18 1038 04/02/18 1000  04/03/18 0706  WBC 9.2 8.7 9.8 7.9  HGB 9.6* 8.8* 8.8* 9.0*  HCT 30.7* 28.6* 28.2* 29.4*  MCV 76.9* 77.1* 77.3* 77.0*  PLT 405* 397 422* 269*   Basic Metabolic Panel: Recent Labs  Lab 03/30/18 0636 03/31/18 1651 04/01/18 1038 04/02/18 1000 04/03/18 0706  NA 137 137 138 138 137  K 3.9 4.2 3.7 3.7  3.7  CL 98* 99* 100* 99* 99*  CO2 28 27 28 28 26   GLUCOSE 191* 169* 174* 186* 160*  BUN 43* 40* 35* 32* 30*  CREATININE 2.74* 2.65* 2.44* 2.32* 2.21*  CALCIUM 8.9 8.8* 9.0 9.0 9.1  MG  --   --   --  1.8  --   PHOS  --   --   --   --  4.1   GFR: Estimated Creatinine Clearance: 54.8 mL/min (A) (by C-G formula based on SCr of 2.21 mg/dL (H)). Liver Function Tests: Recent Labs  Lab 04/03/18 0706  ALBUMIN 2.8*   No results for input(s): LIPASE, AMYLASE in the last 168 hours. No results for input(s): AMMONIA in the last 168 hours. Coagulation Profile: No results for input(s): INR, PROTIME in the last 168 hours. Cardiac Enzymes: No results for input(s): CKTOTAL, CKMB, CKMBINDEX, TROPONINI in the last 168 hours. BNP (last 3 results) No results for input(s): PROBNP in the last 8760 hours. HbA1C: No results for input(s): HGBA1C in the last 72 hours. CBG: Recent Labs  Lab 04/02/18 0740 04/02/18 1133 04/02/18 1639 04/02/18 2051 04/03/18 0721  GLUCAP 170* 190* 141* 149* 156*   Lipid Profile: No results for input(s): CHOL, HDL, LDLCALC, TRIG, CHOLHDL, LDLDIRECT in the last 72 hours. Thyroid Function Tests: No results for input(s): TSH, T4TOTAL, FREET4, T3FREE, THYROIDAB in the last 72 hours. Anemia Panel: No results for input(s): VITAMINB12, FOLATE, FERRITIN, TIBC, IRON, RETICCTPCT in the last 72 hours. Sepsis Labs: No results for input(s): PROCALCITON, LATICACIDVEN in the last 168 hours.  No results found for this or any previous visit (from the past 240 hour(s)).       Radiology Studies: US Renal  Result Date: 04/02/2018 CLINICAL DATA:  Acute on chronic renal failure. History of hypertension, hyperlipidemia, diabetes. EXAM: RENAL / URINARY TRACT ULTRASOUND COMPLETE COMPARISON:  Abdominal ultrasound June 11, 2012. FINDINGS: Limited by larger body habitus. Right Kidney: Length: 11.3 cm. Echogenicity within normal limits. No mass or hydronephrosis visualized. Left Kidney:  Length: 11.4 cm. Echogenicity within normal limits. No mass or hydronephrosis visualized. Bladder: Appears normal for degree of bladder distention. IMPRESSION: Negative renal ultrasound. Electronically Signed   By: Elon Alas M.D.   On: 04/02/2018 14:48        Scheduled Meds: . allopurinol  300 mg Oral Daily  . amLODipine  5 mg Oral Daily  . atorvastatin  80 mg Oral Daily  . cloNIDine  0.3 mg Oral TID  . enoxaparin (LOVENOX) injection  40 mg Subcutaneous Q24H  . fluticasone  1 spray Each Nare Daily  . guaiFENesin  600 mg Oral BID  . insulin aspart  0-15 Units Subcutaneous TID WC  . insulin aspart  0-5 Units Subcutaneous QHS  . insulin aspart  5 Units Subcutaneous TID AC  . insulin detemir  10 Units Subcutaneous QHS  . labetalol  400 mg Oral TID  . linaclotide  145 mcg Oral QAC breakfast  . polyethylene glycol  17 g Oral Daily  . sodium bicarbonate  650 mg Oral BID  . sodium chloride flush  3 mL Intravenous Q12H  .  torsemide  60 mg Oral Daily   Continuous Infusions: . sodium chloride       LOS: 8 days    Time spent: 30 minutes    Kellen Hover Darleen Crocker, DO [facility] Pager [phone]  If 7PM-7AM, please contact night-coverage [email or web address] Password [email or web address] 04/03/2018, 11:08 AM

## 2018-04-03 NOTE — Progress Notes (Signed)
Subjective: Interval History: has no complaint of difficulty breathing.  Patient also denies any orthopnea or paroxysmal nocturnal dyspnea.  He states that his leg swelling is improving..  Objective: Vital signs in last 24 hours: Temp:  [97.1 F (36.2 C)-97.9 F (36.6 C)] 97.9 F (36.6 C) (06/08 0433) Pulse Rate:  [77-79] 79 (06/08 0433) Resp:  [18-19] 19 (06/08 0433) BP: (153-157)/(65-92) 153/65 (06/08 0433) SpO2:  [95 %-96 %] 96 % (06/08 0433) Weight:  [140.3 kg (309 lb 4.9 oz)] 140.3 kg (309 lb 4.9 oz) (06/08 0433) Weight change: -3.41 kg (-7 lb 8.3 oz)  Intake/Output from previous day: 06/07 0701 - 06/08 0700 In: 960 [P.O.:960] Out: 2400 [Urine:2400] Intake/Output this shift: No intake/output data recorded.  General appearance: alert, cooperative and no distress Resp: diminished breath sounds bilaterally Cardio: regular rate and rhythm Extremities: edema He has 1-2+ edema bilaterally.  Right is greater than left.  Lab Results: Recent Labs    04/02/18 1000 04/03/18 0706  WBC 9.8 7.9  HGB 8.8* 9.0*  HCT 28.2* 29.4*  PLT 422* 446*   BMET:  Recent Labs    04/02/18 1000 04/03/18 0706  NA 138 137  K 3.7 3.7  CL 99* 99*  CO2 28 26  GLUCOSE 186* 160*  BUN 32* 30*  CREATININE 2.32* 2.21*  CALCIUM 9.0 9.1   No results for input(s): PTH in the last 72 hours. Iron Studies: No results for input(s): IRON, TIBC, TRANSFERRIN, FERRITIN in the last 72 hours.  Studies/Results: US Renal  Result Date: 04/02/2018 CLINICAL DATA:  Acute on chronic renal failure. History of hypertension, hyperlipidemia, diabetes. EXAM: RENAL / URINARY TRACT ULTRASOUND COMPLETE COMPARISON:  Abdominal ultrasound June 11, 2012. FINDINGS: Limited by larger body habitus. Right Kidney: Length: 11.3 cm. Echogenicity within normal limits. No mass or hydronephrosis visualized. Left Kidney: Length: 11.4 cm. Echogenicity within normal limits. No mass or hydronephrosis visualized. Bladder: Appears normal  for degree of bladder distention. IMPRESSION: Negative renal ultrasound. Electronically Signed   By: Elon Alas M.D.   On: 04/02/2018 14:48    I have reviewed the patient's current medications.  Assessment/Plan: 1] renal failure: Possibly chronic.  His BUN and creatinine is improving.  Presently patient is a symptomatic.  Ultrasound of the kidneys showed right kidney to be 11.3 cm and left  One 11.4.  There is no hydronephrosis. This result seems to be somewhat different from the previous image.  The etiology could be secondary to diabetes/hypertension/obesity related glomerulopathy. 2] anasarca: Presently he is on Lasix.  Patient had 2400 cc of urine output.  Still patient has anasarca. 3] proteinuria: 24-hour collection is pending.  Complement is normal.  Hepatitis B surface antigen and hepatitis C antibodies negative. 4] diabetes: Presently denies any polydipsia.  His random blood sugar is reasonable. 5] hypertension: His blood pressure is reasonably controlled 6] obesity 7] anemia: His hemoglobin is low.  Etiology at this moment is no clear 8] bone and mineral disorder: His calcium and phosphorus is range Plan: 1] we will DC IV Lasix 2] we will start on Demadex 60 mg p.o. once a day 3] we will check a renal panel and CBC in the morning 4] we will check iron studies in the morning. 5] once patient is discharged we will follow him as an outpatient.   LOS: 8 days   Mahathi Pokorney S 04/03/2018,9:15 AM

## 2018-04-04 LAB — RENAL FUNCTION PANEL
Albumin: 2.9 g/dL — ABNORMAL LOW (ref 3.5–5.0)
Anion gap: 10 (ref 5–15)
BUN: 30 mg/dL — ABNORMAL HIGH (ref 6–20)
CO2: 28 mmol/L (ref 22–32)
Calcium: 9 mg/dL (ref 8.9–10.3)
Chloride: 101 mmol/L (ref 101–111)
Creatinine, Ser: 2.3 mg/dL — ABNORMAL HIGH (ref 0.61–1.24)
GFR calc Af Amer: 37 mL/min — ABNORMAL LOW (ref >60)
GFR calc non Af Amer: 32 mL/min — ABNORMAL LOW (ref >60)
Glucose, Bld: 179 mg/dL — ABNORMAL HIGH (ref 65–99)
Phosphorus: 4.4 mg/dL (ref 2.5–4.6)
Potassium: 3.7 mmol/L (ref 3.5–5.1)
Sodium: 139 mmol/L (ref 135–145)

## 2018-04-04 LAB — IRON AND TIBC
Iron: 43 ug/dL — ABNORMAL LOW (ref 45–182)
Saturation Ratios: 13 % — ABNORMAL LOW (ref 17.9–39.5)
TIBC: 321 ug/dL (ref 250–450)
UIBC: 278 ug/dL

## 2018-04-04 LAB — CBC
HCT: 29.9 % — ABNORMAL LOW (ref 39.0–52.0)
Hemoglobin: 9.2 g/dL — ABNORMAL LOW (ref 13.0–17.0)
MCH: 23.8 pg — ABNORMAL LOW (ref 26.0–34.0)
MCHC: 30.8 g/dL (ref 30.0–36.0)
MCV: 77.3 fL — ABNORMAL LOW (ref 78.0–100.0)
Platelets: 467 10*3/uL — ABNORMAL HIGH (ref 150–400)
RBC: 3.87 MIL/uL — ABNORMAL LOW (ref 4.22–5.81)
RDW: 15.5 % (ref 11.5–15.5)
WBC: 8.3 10*3/uL (ref 4.0–10.5)

## 2018-04-04 LAB — GLUCOSE, CAPILLARY: Glucose-Capillary: 163 mg/dL — ABNORMAL HIGH (ref 65–99)

## 2018-04-04 LAB — FERRITIN: Ferritin: 98 ng/mL (ref 24–336)

## 2018-04-04 MED ORDER — LOSARTAN POTASSIUM 50 MG PO TABS
100.0000 mg | ORAL_TABLET | Freq: Every day | ORAL | Status: DC
  Administered 2018-04-04: 100 mg via ORAL
  Filled 2018-04-04: qty 2

## 2018-04-04 MED ORDER — LINACLOTIDE 145 MCG PO CAPS: 145.0000 ug | ORAL_CAPSULE | Freq: Every day | ORAL | 0 refills | Status: DC

## 2018-04-04 MED ORDER — CLONIDINE HCL 0.3 MG PO TABS: 0.3000 mg | ORAL_TABLET | Freq: Three times a day (TID) | ORAL | 0 refills | Status: DC

## 2018-04-04 MED ORDER — INSULIN PEN NEEDLE 31G X 5 MM MISC: 0 refills | Status: DC

## 2018-04-04 MED ORDER — LOSARTAN POTASSIUM 100 MG PO TABS: 100.0000 mg | ORAL_TABLET | Freq: Every day | ORAL | 3 refills | Status: DC

## 2018-04-04 MED ORDER — TORSEMIDE 20 MG PO TABS: 40.0000 mg | ORAL_TABLET | Freq: Every day | ORAL | 3 refills | Status: DC

## 2018-04-04 MED ORDER — AMLODIPINE BESYLATE 5 MG PO TABS: 5.0000 mg | ORAL_TABLET | Freq: Every day | ORAL | 2 refills | Status: DC

## 2018-04-04 MED ORDER — FLUTICASONE PROPIONATE 50 MCG/ACT NA SUSP: 1.0000 | Freq: Every day | NASAL | 0 refills | Status: DC

## 2018-04-04 MED ORDER — LABETALOL HCL 200 MG PO TABS: 400.0000 mg | ORAL_TABLET | Freq: Three times a day (TID) | ORAL | 3 refills | Status: DC

## 2018-04-04 MED ORDER — GUAIFENESIN ER 600 MG PO TB12: 600.0000 mg | ORAL_TABLET | Freq: Two times a day (BID) | ORAL | 0 refills | Status: AC

## 2018-04-04 MED ORDER — TORSEMIDE 20 MG PO TABS
40.0000 mg | ORAL_TABLET | Freq: Every day | ORAL | Status: DC
  Administered 2018-04-04: 40 mg via ORAL
  Filled 2018-04-04: qty 2

## 2018-04-04 NOTE — Progress Notes (Signed)
IV removed, 2x2 gauze and paper tape applied to site, patient tolerated well.  Reviewed AVS/ discharge information with patient, who verbalized understanding.  Patient awaiting arrival of family to transport home.

## 2018-04-04 NOTE — Discharge Summary (Signed)
Physician Discharge Summary  GEORDIE NOONEY CLE:[id number] DOB: 1971/08/19 DOA: 03/26/2018  PCP: Neale Burly, MD  Admit date: 03/26/2018  Discharge date: 04/04/2018  Admitted From:Home  Disposition:  Home  Recommendations for Outpatient Follow-up:  1. Follow up with PCP in 1-2 weeks 2. Follow up with Nephrology Dr. Lowanda Foster in 4 weeks  Home Health:N/A  Equipment/Devices:N/A  Discharge Condition:Stable  CODE STATUS: Full  Diet recommendation: Heart Healthy/Carb Modified  Brief/Interim Summary:  47 year old male with history of long-standing hypertension, diastolic heart failure, presents to the hospital with worsening shortness of breath due to decompensated CHF.  He was admitted for IV diuresis and has done quite well.  His weight has dropped approximately 15 pounds and due to difficulty with volume overload was seen in consultation by nephrology Dr. Lowanda Foster who has diagnosed nephrotic syndrome likely secondary to diabetes.  He is overall feeling much better and has been recommended to remain on losartan for his proteinuria and has been switched over to oral Demadex 40 mg daily.  He will follow-up with Dr. Lowanda Foster in 4 weeks for reevaluation.  He will also continue with daily weights.  His blood pressures have also run high during this course of this admission which has improved with increased dose of clonidine, and addition of Norvasc.   Discharge Diagnoses:  Active Problems:   HTN (hypertension)   Morbid obesity (HCC)   Diabetes mellitus ([place])   CHF exacerbation (HCC)   Acute on chronic diastolic CHF (congestive heart failure) (HCC)   Acute respiratory failure with hypoxia ([place])  1. Acute respiratory failure with hypoxia.    Resolved and was noted to be secondary to decompensated diastolic CHF.  LVEF 60 to 65% on echocardiogram performed on 03/27/2018 with indeterminate grade diastolic dysfunction. 2. Acute on chronic diastolic congestive heart failure.    This has resolved.   Will continue on Demadex 40 mg daily with fluid restriction at home and daily monitoring of weights. 3. Hypertension-improved control.  Continue current med regimen. 4. Diabetes.  Blood sugar stable.  Continue on Levemir and NovoLog.  Metformin held at this time due to elevated creatinine levels. 5. Acute kidney injury on chronic kidney disease stage III with nephrotic syndrome.    Currently stabilized and will follow up with nephrology as an outpatient.  Renal U/S WNL.  Patient will remain on losartan for nephrotic syndrome suspected to be related to diabetes and obesity.  Has been recommended a low protein diet. 6. Constipation.  Resolved with Linzess 7. Obesity. Counseled on diet and exercise.   Discharge Instructions  Discharge Instructions    (HEART FAILURE PATIENTS) Call MD:  Anytime you have any of the following symptoms: 1) 3 pound weight gain in 24 hours or 5 pounds in 1 week 2) shortness of breath, with or without a dry hacking cough 3) swelling in the hands, feet or stomach 4) if you have to sleep on extra pillows at night in order to breathe.   Complete by:  As directed    AMB Referral to [facility] [facility]   Complete by:  As directed    Please assign patient for community nurse to engage for transition of care calls and evaluate for monthly home visits. Patient does not have home scale for daily weights and states he needs education on Heart Failure. Please also assign [place] for patient for expressed need for assistance with the cost of medication by the name of Linzess.  For questions please contact:   Janci Minor  RN, [facility]iaison [phone])   Reason for consult:  Post hospital discharge follow up with [place] [facility] Pharmacist.   Diagnoses of:   Heart Failure Diabetes     Expected date of contact:  1-3 days (reserved for hospital discharges)   Diet - low sodium heart healthy   Complete by:  As directed    Increase activity slowly    Complete by:  As directed      Allergies as of 04/04/2018      Reactions   Lisinopril Swelling      Medication List    STOP taking these medications   metFORMIN 1000 MG tablet Commonly known as:  GLUCOPHAGE     TAKE these medications   allopurinol 300 MG tablet Commonly known as:  ZYLOPRIM Take 300 mg by mouth daily.   amLODipine 5 MG tablet Commonly known as:  NORVASC Take 1 tablet (5 mg total) by mouth daily.   atorvastatin 80 MG tablet Commonly known as:  LIPITOR Take 80 mg by mouth daily.   cloNIDine 0.3 MG tablet Commonly known as:  CATAPRES Take 1 tablet (0.3 mg total) by mouth 3 (three) times daily. What changed:    medication strength  how much to take   fluticasone 50 MCG/ACT nasal spray Commonly known as:  FLONASE Place 1 spray into both nostrils daily.   guaiFENesin 600 MG 12 hr tablet Commonly known as:  MUCINEX Take 1 tablet (600 mg total) by mouth 2 (two) times daily.   hydrALAZINE 50 MG tablet Commonly known as:  APRESOLINE Take 50 mg by mouth 3 (three) times daily.   insulin aspart 100 UNIT/ML injection Commonly known as:  novoLOG Inject 5 Units into the skin 3 (three) times daily before meals.   insulin detemir 100 UNIT/ML injection Commonly known as:  LEVEMIR Inject 10 Units into the skin at bedtime.   labetalol 200 MG tablet Commonly known as:  NORMODYNE Take 2 tablets (400 mg total) by mouth 3 (three) times daily. What changed:  when to take this   linaclotide 145 MCG Caps capsule Commonly known as:  LINZESS Take 1 capsule (145 mcg total) by mouth daily before breakfast. Start taking on:  04/05/2018   losartan 100 MG tablet Commonly known as:  COZAAR Take 1 tablet (100 mg total) by mouth daily.   sodium bicarbonate 650 MG tablet Take 650 mg by mouth 2 (two) times daily.   torsemide 20 MG tablet Commonly known as:  DEMADEX Take 2 tablets (40 mg total) by mouth daily.      Follow-up Information    Fran Lowes, MD  Follow up in 4 week(s).   Specialty:  Nephrology Contact information: [address] [address] [phone]        Neale Burly, MD Follow up in 2 week(s).   Specialty:  Internal Medicine Contact information: [place] [address] 154 [phone]          Allergies  Allergen Reactions  . Lisinopril Swelling    Consultations:  Nephrology   Procedures/Studies: US Renal  Result Date: 04/02/2018 CLINICAL DATA:  Acute on chronic renal failure. History of hypertension, hyperlipidemia, diabetes. EXAM: RENAL / URINARY TRACT ULTRASOUND COMPLETE COMPARISON:  Abdominal ultrasound June 11, 2012. FINDINGS: Limited by larger body habitus. Right Kidney: Length: 11.3 cm. Echogenicity within normal limits. No mass or hydronephrosis visualized. Left Kidney: Length: 11.4 cm. Echogenicity within normal limits. No mass or hydronephrosis  visualized. Bladder: Appears normal for degree of bladder distention. IMPRESSION: Negative renal ultrasound. Electronically Signed   By: Elon Alas M.D.   On: 04/02/2018 14:48   US Venous Img Upper Uni Left  Result Date: 03/28/2018 CLINICAL DATA:  Left upper extremity edema since being admitted on 03/16/2018. Evaluate for DVT. EXAM: LEFT UPPER EXTREMITY VENOUS DOPPLER ULTRASOUND TECHNIQUE: Gray-scale sonography with graded compression, as well as color Doppler and duplex ultrasound were performed to evaluate the upper extremity deep venous system from the level of the subclavian vein and including the jugular, axillary, basilic, radial, ulnar and upper cephalic vein. Spectral Doppler was utilized to evaluate flow at rest and with distal augmentation maneuvers. COMPARISON:  None. FINDINGS: Contralateral Subclavian Vein: Respiratory phasicity is normal and symmetric with the symptomatic side. No evidence of thrombus. Normal compressibility. Internal Jugular Vein: No evidence of thrombus. Normal compressibility, respiratory  phasicity and response to augmentation. Subclavian Vein: No evidence of thrombus. Normal compressibility, respiratory phasicity and response to augmentation. Axillary Vein: No evidence of thrombus. Normal compressibility, respiratory phasicity and response to augmentation. Cephalic Vein: No evidence of thrombus. Normal compressibility, respiratory phasicity and response to augmentation. Basilic Vein: No evidence of thrombus. Normal compressibility, respiratory phasicity and response to augmentation. Brachial Veins: No evidence of thrombus. Normal compressibility, respiratory phasicity and response to augmentation. Radial Veins: No evidence of thrombus. Normal compressibility, respiratory phasicity and response to augmentation. Ulnar Veins: No evidence of thrombus. Normal compressibility, respiratory phasicity and response to augmentation. Venous Reflux:  None visualized. Other Findings:  None visualized. IMPRESSION: No evidence of DVT within the left upper extremity. Electronically Signed   By: Sandi Mariscal M.D.   On: 03/28/2018 10:36   Dg Chest Port 1 View  Result Date: 03/31/2018 CLINICAL DATA:  Dyspnea, shortness of breath more yesterday than today, congestion, history acute diastolic congestive heart failure, diabetes mellitus, malignant hypertension EXAM: PORTABLE CHEST 1 VIEW COMPARISON:  Portable exam 1355 hours compared to 03/26/2018 FINDINGS: Enlargement of cardiac silhouette with pulmonary vascular congestion. Mediastinal contours normal. Mild perihilar infiltrate likely representing mild pulmonary edema. No segmental consolidation, pleural effusion, or pneumothorax. IMPRESSION: Mild CHF. Electronically Signed   By: Lavonia Dana M.D.   On: 03/31/2018 15:34   Dg Chest Port 1 View  Result Date: 03/26/2018 CLINICAL DATA:  Chronic shortness of breath. EXAM: PORTABLE CHEST 1 VIEW COMPARISON:  Chest radiograph performed 06/11/2012 FINDINGS: The lungs are well-aerated. Vascular congestion is noted. Increased  interstitial markings raise concern for pulmonary edema. There is no evidence of pleural effusion or pneumothorax. The cardiomediastinal silhouette is mildly enlarged. No acute osseous abnormalities are seen. IMPRESSION: Vascular congestion and mild cardiomegaly. Increased interstitial markings raise concern for pulmonary edema. Electronically Signed   By: Garald Balding M.D.   On: 03/26/2018 07:08    Discharge Exam: Vitals:   04/04/18 0548 04/04/18 0620  BP: (!) 182/97 (!) 162/90  Pulse: 73   Resp: 16   Temp: 97.7 F (36.5 C)   SpO2: 97%    Vitals:   04/03/18 1348 04/03/18 2108 04/04/18 0548 04/04/18 0620  BP: (!) 143/89 (!) 155/89 (!) 182/97 (!) 162/90  Pulse: 81 69 73   Resp: (!) 22 16 16    Temp: 98.2 F (36.8 C) 98.3 F (36.8 C) 97.7 F (36.5 C)   TempSrc: Oral Oral Oral   SpO2: 92% 94% 97%   Weight:   (!) 139.3 kg (307 lb)   Height:        General: Pt is alert, awake,  not in acute distress; obese Cardiovascular: RRR, S1/S2 +, no rubs, no gallops Respiratory: CTA bilaterally, no wheezing, no rhonchi Abdominal: Soft, NT, ND, bowel sounds + Extremities: no edema, no cyanosis    The results of significant diagnostics from this hospitalization (including imaging, microbiology, ancillary and laboratory) are listed below for reference.     Microbiology: No results found for this or any previous visit (from the past 240 hour(s)).   Labs: BNP (last 3 results) Recent Labs    03/26/18 0645  BNP 161.0*   Basic Metabolic Panel: Recent Labs  Lab 03/31/18 1651 04/01/18 1038 04/02/18 1000 04/03/18 0706 04/04/18 0628  NA 137 138 138 137 139  K 4.2 3.7 3.7 3.7 3.7  CL 99* 100* 99* 99* 101  CO2 27 28 28 26 28   GLUCOSE 169* 174* 186* 160* 179*  BUN 40* 35* 32* 30* 30*  CREATININE 2.65* 2.44* 2.32* 2.21* 2.30*  CALCIUM 8.8* 9.0 9.0 9.1 9.0  MG  --   --  1.8  --   --   PHOS  --   --   --  4.1 4.4   Liver Function Tests: Recent Labs  Lab 04/03/18 0706  04/04/18 0628  ALBUMIN 2.8* 2.9*   No results for input(s): LIPASE, AMYLASE in the last 168 hours. No results for input(s): AMMONIA in the last 168 hours. CBC: Recent Labs  Lab 03/28/18 1252 04/01/18 1038 04/02/18 1000 04/03/18 0706 04/04/18 0628  WBC 9.2 8.7 9.8 7.9 8.3  HGB 9.6* 8.8* 8.8* 9.0* 9.2*  HCT 30.7* 28.6* 28.2* 29.4* 29.9*  MCV 76.9* 77.1* 77.3* 77.0* 77.3*  PLT 405* 397 422* 446* 467*   Cardiac Enzymes: No results for input(s): CKTOTAL, CKMB, CKMBINDEX, TROPONINI in the last 168 hours. BNP: Invalid input(s): POCBNP CBG: Recent Labs  Lab 04/03/18 0721 04/03/18 1155 04/03/18 1623 04/03/18 2111 04/04/18 0755  GLUCAP 156* 168* 165* 189* 163*   D-Dimer No results for input(s): DDIMER in the last 72 hours. Hgb A1c No results for input(s): HGBA1C in the last 72 hours. Lipid Profile No results for input(s): CHOL, HDL, LDLCALC, TRIG, CHOLHDL, LDLDIRECT in the last 72 hours. Thyroid function studies No results for input(s): TSH, T4TOTAL, T3FREE, THYROIDAB in the last 72 hours.  Invalid input(s): FREET3 Anemia work up No results for input(s): VITAMINB12, FOLATE, FERRITIN, TIBC, IRON, RETICCTPCT in the last 72 hours. Urinalysis    Component Value Date/Time   COLORURINE YELLOW 06/11/2012 0518   APPEARANCEUR CLEAR 06/11/2012 0518   LABSPEC >1.030 (H) 06/11/2012 0518   PHURINE 5.5 06/11/2012 0518   GLUCOSEU NEGATIVE 06/11/2012 0518   HGBUR NEGATIVE 06/11/2012 0518   BILIRUBINUR NEGATIVE 06/11/2012 0518   KETONESUR NEGATIVE 06/11/2012 0518   PROTEINUR 100 (A) 06/11/2012 0518   UROBILINOGEN 0.2 06/11/2012 0518   NITRITE NEGATIVE 06/11/2012 0518   LEUKOCYTESUR NEGATIVE 06/11/2012 0518   Sepsis Labs Invalid input(s): PROCALCITONIN,  WBC,  LACTICIDVEN Microbiology No results found for this or any previous visit (from the past 240 hour(s)).   Time coordinating discharge: 40 minutes  SIGNED:   Rodena Goldmann, DO [facility] 04/04/2018, 9:13  AM Pager [phone]  If 7PM-7AM, please contact night-coverage [email or web address] Password [email or web address]

## 2018-04-04 NOTE — Progress Notes (Signed)
Subjective: Interval History: Patient feeling good and denies any difficulty in breathing  Objective: Vital signs in last 24 hours: Temp:  [97.7 F (36.5 C)-98.3 F (36.8 C)] 97.7 F (36.5 C) (06/09 0548) Pulse Rate:  [69-81] 73 (06/09 0548) Resp:  [16-22] 16 (06/09 0548) BP: (143-182)/(89-97) 162/90 (06/09 0620) SpO2:  [92 %-97 %] 97 % (06/09 0548) Weight:  [139.3 kg (307 lb)] 139.3 kg (307 lb) (06/09 0548) Weight change: -1.046 kg (-2 lb 4.9 oz)  Intake/Output from previous day: 06/08 0701 - 06/09 0700 In: 1100 [P.O.:1100] Out: 3250 [Urine:3250] Intake/Output this shift: No intake/output data recorded.  General appearance: alert, cooperative and no distress Resp: diminished breath sounds bilaterally Cardio: regular rate and rhythm Extremities: edema He has 1-2+ edema bilaterally.  Right is greater than left.  Lab Results: Recent Labs    04/03/18 0706 04/04/18 0628  WBC 7.9 8.3  HGB 9.0* 9.2*  HCT 29.4* 29.9*  PLT 446* 467*   BMET:  Recent Labs    04/03/18 0706 04/04/18 0628  NA 137 139  K 3.7 3.7  CL 99* 101  CO2 26 28  GLUCOSE 160* 179*  BUN 30* 30*  CREATININE 2.21* 2.30*  CALCIUM 9.1 9.0   No results for input(s): PTH in the last 72 hours. Iron Studies: No results for input(s): IRON, TIBC, TRANSFERRIN, FERRITIN in the last 72 hours.  Studies/Results: US Renal  Result Date: 04/02/2018 CLINICAL DATA:  Acute on chronic renal failure. History of hypertension, hyperlipidemia, diabetes. EXAM: RENAL / URINARY TRACT ULTRASOUND COMPLETE COMPARISON:  Abdominal ultrasound June 11, 2012. FINDINGS: Limited by larger body habitus. Right Kidney: Length: 11.3 cm. Echogenicity within normal limits. No mass or hydronephrosis visualized. Left Kidney: Length: 11.4 cm. Echogenicity within normal limits. No mass or hydronephrosis visualized. Bladder: Appears normal for degree of bladder distention. IMPRESSION: Negative renal ultrasound. Electronically Signed   By: Elon Alas M.D.   On: 04/02/2018 14:48    I have reviewed the patient's current medications.  Assessment/Plan: 1] renal failure: Chronic.  Presently his renal function remains stable.  The etiology could be secondary to diabetes/hypertension/obesity related glomerulopathy. 2] anasarca: Patient is on Demadex 60 mg once a day.  He has 3200 cc of urine output.  Patient has lost about 15 pounds since his admission.  Still has significant sign of fluid overload. 3] proteinuria: Patient with increased 6200 mg/dL of proteinuria.  Hence patient has nephrotic syndrome possibly secondary to diabetes. 4] diabetes: Presently denies any polydipsia.  His random blood sugar is reasonable. 5] hypertension: His blood pressure is reasonably controlled 6] obesity 7] anemia: His hemoglobin is low.  Etiology at this moment is no clear.  This could be secondary to iron deficiency anemia or anemia of chronic disease.  Iron studies is pending. 8] bone and mineral disorder: His calcium and phosphorus is range Plan: 1]  we will decrease Demadex to 40 mg once a day 2] patient advised to cut down his salt and fluid intake 3] I have discussed with him about his proteinuria and nephrotic syndrome.  Advised him to cut down also his protein intake. 4] I will see him in 4 weeks if patient is going to discharge. 5] we will check his renal panel in the morning. 6] We will put him back on Cozaar whic he was taking before coming to the hospital with out any problem   LOS: 9 days   Javon Snee S 04/04/2018,7:37 AM

## 2018-04-05 ENCOUNTER — Other Ambulatory Visit: Payer: Self-pay

## 2018-04-05 LAB — ANTINUCLEAR ANTIBODIES, IFA: ANA Ab, IFA: NEGATIVE

## 2018-04-05 NOTE — Patient Outreach (Signed)
[facility]) Care Management  04/05/2018  JERE BOSTROM [birth year] [id number]  47 year old male referred to [place] [facility] by hospital liaison.  [place] services requested for medication assistance for linaclotide.  PMHx includes, but not limited to, hypertension, diastolic heart failure, diabetes mellitus and morbid obesity.   Successful outreach attempt #1 to Mr. Navarro. HIPAA identifiers verified.   Subjective: Mr. Herbers reports that his prescription for linaclotide (Linzess) was covered by Medicaid.  He reports that he does not have his medication list in front of him, so he cannot do a medication review.  He requests that I call him back in 30 minutes.  I requested that he have his bottles available when I call back.    Unsuccessful outreach attempt 30 minutes later to Mr. Koontz.  Left HIPAA compliant voice message requesting return call.  Plan: Outreach attempt #2 in 3-4 business days.   Will send unsuccessful outreach attempt letter.  Joetta Manners, PharmD Clinical Pharmacist [place] [phone]

## 2018-04-05 NOTE — Patient Outreach (Signed)
This encounter was created in error - please disregard.

## 2018-04-05 NOTE — Patient Outreach (Addendum)
[facility]) Care Management  [place]   04/05/2018  Kelly Key [birth year] [id number]  47 year old male referred to [facility] Management by hospital liaison.  [place] services requested for medication assistance for linaclotide.  PMHx includes, but not limited to, hypertension, diastolic heart failure, diabetes mellitus and morbid obesity.   Successful outreach attempt #1 to Kelly Key. HIPAA identifiers verified.   Subjective: Kelly Key reports that his prescription for linaclotide (Linzess) was covered by Medicaid.  He reports that he does not have his medication list in front of him, so he cannot do a medication review.  He requests that I call him back in 30 minutes.  I requested that he have his bottles available when I call back.    Unsuccessful outreach attempt 30 minutes later to Kelly Key.  Left HIPAA compliant voice message requesting return call.  Incoming call received from Kelly Key.  HIPAA identifiers verified.  Patient handed the phone to his fiancee, Kelly Key to answer my questions about his medications.   Subjective: Kelly Key, Kelly Key fiancee reports that patient weighs himself daily with a weight today of 303 lbs and 310 lbs yesterday.  Hospital flow sheet on day of discharge was 309 lbs.  Kelly Key states that the new blood pressure medication, hydralazine, is making Kelly Key light headed, nauseated and have hallucinations.  She states he thought he was running on a tread mill last night.  She states the Kelly Key checks his sugars everyday and that he has been running high.  She reports a CBG of 295 at 2230 yesterday, which went down to 207 at 0430 after he took his bedtime levemir.  She does not report any recent episodes of hypoglycemia.  Kelly Key states that she gets his medications out from the bottle when due for Kelly Key to take.  She reports that he does not have any affordability issues with his prescriptions.  Kelly Key states Kelly Key has an  appointment with his PCP, Kelly Key on Monday at 1500.  Objective:  SCr 2.30 mg/dL on 04/04/18 HgA1c of 13.1% on 01/08/18  Current Medications: Current Outpatient Medications  Medication Sig Dispense Refill  . allopurinol (ZYLOPRIM) 300 MG tablet Take 300 mg by mouth daily.    [name] amLODipine (NORVASC) 5 MG tablet Take 1 tablet (5 mg total) by mouth daily. 30 tablet 2  . atorvastatin (LIPITOR) 80 MG tablet Take 80 mg by mouth daily.    . cloNIDine (CATAPRES) 0.3 MG tablet Take 1 tablet (0.3 mg total) by mouth 3 (three) times daily. 90 tablet 0  . fluticasone (FLONASE) 50 MCG/ACT nasal spray Place 1 spray into both nostrils daily. 1 g 0  . guaiFENesin (MUCINEX) 600 MG 12 hr tablet Take 1 tablet (600 mg total) by mouth 2 (two) times daily. 60 tablet 0  . hydrALAZINE (APRESOLINE) 50 MG tablet Take 50 mg by mouth 3 (three) times daily.    . insulin aspart (NOVOLOG) 100 UNIT/ML injection Inject 5 Units into the skin 3 (three) times daily before meals.    . insulin detemir (LEVEMIR) 100 UNIT/ML injection Inject 10 Units into the skin at bedtime.    . Insulin Pen Needle 31G X 5 MM MISC Use as directed. 100 each 0  . labetalol (NORMODYNE) 200 MG tablet Take 2 tablets (400 mg total) by mouth 3 (three) times daily. 180 tablet 3  . linaclotide (LINZESS) 145 MCG CAPS capsule Take 1 capsule (145 mcg total) by mouth daily  before breakfast. 30 capsule 0  . losartan (COZAAR) 100 MG tablet Take 1 tablet (100 mg total) by mouth daily. 30 tablet 3  . sodium bicarbonate 650 MG tablet Take 650 mg by mouth 2 (two) times daily.    [name] torsemide (DEMADEX) 20 MG tablet Take 2 tablets (40 mg total) by mouth daily. 60 tablet 3   No current facility-administered medications for this visit.     Functional Status: In your present state of health, do you have any difficulty performing the following activities: 03/26/2018  Hearing? N  Vision? N  Difficulty concentrating or making decisions? N  Walking or climbing stairs? N   Dressing or bathing? N  Doing errands, shopping? N  Some recent data might be hidden   Fall/Depression Screening: No flowsheet data found. No flowsheet data found.  ASSESSMENT: Date Discharged from Hospital: 04/04/18 Date Medication Reconciliation Performed: 04/05/2018  Medications Discontinued at Discharge:   Metformin  New Medications at Discharge:   amlodipine  clonidine dose increase  torsemide  linaclotide  No new medications were prescribed at discharge.  Patient was recently discharged from hospital and all medications have been reviewed  Drugs sorted by system:  Neurologic/Psychologic:  Cardiovascular: amlodipine, atorvastatin, clonidine, hydralazine, labetalol, losartan, torsemide  Pulmonary/Allergy: fluticasone,   Gastrointestinal: linaclotide,   Endocrine: insulin aspart, insulin detemir  Renal: sodium bicarbonte  Miscellaneous: allopurinol, guaifenesin,   Other issues noted:  Per [place] Pharmacist, Kelly Key has been on higher doses of insulin aspart and levemir in the past.  Consider titrating insulin regimen up for better glycemic control.  His last HgA1c was 13.1%  Assessment: Patient was at [facility] two days prior to his [facility] hospitalization.  Per [place], hydralazine was started on 03/23/18.  Hydralazine seems like the most likely culprit of the recently added medications that would be causing the issues reported above.  PLAN: Route note to PCP, Dr. Sherrie Sport to inform him of Kelly Key's adverse effects that he perceives is from hydralazine.   Recommend to increase his insulin regimen for better glycemic control.   Joetta Manners, PharmD Clinical Pharmacist [place] [phone]  Addendum: Incoming call received from Amy at Dr. Rhett Bannister office.  MD is aware of Kelly Key's side effects that he perceives to be from hydralazine.  He has an appointment on 6/17 and will follow up with him then to see if  improved.  Office reports that Kelly Key has a long standing history of noncompliance with his insulin regimen.  Dr. Telford Nab will evaluate at next visit.   Will close [place] medication review case.  Joetta Manners, PharmD Clinical Pharmacist [place] [phone]

## 2018-04-06 ENCOUNTER — Other Ambulatory Visit: Payer: Self-pay | Admitting: *Deleted

## 2018-04-06 NOTE — Patient Outreach (Signed)
Referral received today from my colleague Donald Siva as this pt is in my area demographically, pt hospitalized 03/26/18-04/04/18 for exacerbation CHF, pt with history of DM, HTN, obesity.  Telephone call to pt for transition of care week 1, no answer to telephone [phone], left voicemail requesting return phone call. Mailed unsuccessful outreach letter to patient's home.  PLAN Outreach pt in 3-4 business days  Jacqlyn Larsen [facility], [place] [phone]

## 2018-04-08 ENCOUNTER — Ambulatory Visit: Payer: Self-pay

## 2018-04-09 ENCOUNTER — Other Ambulatory Visit: Payer: Self-pay | Admitting: *Deleted

## 2018-04-09 NOTE — Patient Outreach (Signed)
Telephone call to pt for transition of care week 1/  2nd attempt, spoke with pt, HIPAA verified, pt reports " I'm in the process of getting a new primary doctor, I think it might be Dr. Berdine Addison"  Pt states hospital did give him a scale and he weighs daily with weight today 293 pounds.  Pt states he will make appointment for Dr. Lowanda Foster as instructed to follow up 4 weeks.  Pt states he has medications and is able to afford and [facility] pharmacist reached out to him and reviewed medications.  Pt reports he checks CBG daily with reading today 209.  Pt states he is outside the home without medications and other documents in front of him, pt agreeable to home visit next week for further assessment. RN CM emphasized importance of obtaining new doctor as soon as possible and getting a post hospital follow up appointment, pt states he does not need assistance with this process and already has the forms to complete.  [facility]N CM Care Plan Problem One     Most Recent Value  Care Plan Problem One  Knowledge deficit related to CHF  Role Documenting the Problem One  Care Management Coordinator  Care Plan for Problem One  Active  THN Long Term Goal   Pt will demonstrate/ verbalize improved self care aeb by better outcomes within 60 days  THN Long Term Goal Start Date  04/09/18  Interventions for Problem One Long Term Goal  Rn CM reviewed discharge instructions, attempted to review medications over the phone and pt unable citing he is not at home, pt in process of getting new primary care MD and "thinks it's Dr. Berdine Addison in [place]"  pt not sure exactly who it is, states "forms are at home"  [facility] CM Short Term Goal #1   Pt will verbalize CHF action plan/ zones within 30 days  THN CM Short Term Goal #1 Start Date  04/09/18  Interventions for Short Term Goal #1  RN CM reviewed CHF action plan/ zones, pt verbalizes he knows to call MD for weight gain, reviewed importance of calling early for change in health status/ symptoms    THN  CM Care Plan Problem Two     Most Recent Value  Care Plan Problem Two  Knowledge deficit related to diabetes  Role Documenting the Problem Two  Care Management Coordinator  Care Plan for Problem Two  Active  [facility]N CM Short Term Goal #1   Pt will verbalize food choices for carbohydrate modified diet/ plate method within 30 days  [facility]N CM Short Term Goal #1 Start Date  04/09/18  Interventions for Short Term Goal #2   RN CM reviewed diet instructions and importance of being mindful of carbohydrate intake, reviewed 2 liter fluid restriction      PLAN See pt for initial home visit next week  Jacqlyn Larsen [facility], [place] Coordinator [phone]

## 2018-04-13 ENCOUNTER — Ambulatory Visit: Payer: Medicare Other

## 2018-04-16 ENCOUNTER — Encounter: Payer: Self-pay | Admitting: *Deleted

## 2018-04-16 ENCOUNTER — Other Ambulatory Visit: Payer: Self-pay | Admitting: *Deleted

## 2018-04-16 NOTE — Patient Outreach (Signed)
4:06 pm- pt called back and stated CBG came down to 350.  RN CM reviewed signs/ symptoms hypo and hyperglycemia and actions to take.  Jacqlyn Larsen [facility], [place] Coordinator [phone]

## 2018-04-16 NOTE — Patient Outreach (Addendum)
[facility]) Care Management   04/16/2018  Kelly Key [birth year] [id number]  Kelly Key is an 47 y.o. male  Subjective: Initial home visit with pt, HIPAA verified, patient's girlfriend [name] present and states she does cooking, shopping and assisting pt with diet, medications, etc.  Pt in between doctors, states will not be seeing Dr. Sherrie Sport and completed forms for new MD Dr. Everette Rank and awaiting appointment which may be 3-4 weeks.  Pt reports he is weighing daily, checks CBG daily and checks blood pressure almost daily and records.  Pt reports he feels better since losing fluid, is trying to walk some outside around the block, would like to lose some weight.    Objective:   Vitals:   04/16/18 1214  BP: 128/64  Pulse: 79  Resp: 18  SpO2: 98%  Weight: 284 lb (128.8 kg)  Height: 1.854 m (6\' 1" )  CBG reads "HI" today, unable to determine reading Per CBG log 200's range with some readings near 300 ROS  Physical Exam  Constitutional: He is oriented to person, place, and time. He appears well-developed and well-nourished.  HENT:  Head: Normocephalic.  Neck: Normal range of motion. Neck supple.  Cardiovascular: Normal rate and regular rhythm.  Respiratory: Effort normal and breath sounds normal.  GI: Soft. Bowel sounds are normal.  Musculoskeletal: Normal range of motion. He exhibits edema.  Dependent edema lower extremities bil  Neurological: He is alert and oriented to person, place, and time.  Skin: Skin is warm and dry.  Psychiatric: He has a normal mood and affect. His behavior is normal. Judgment and thought content normal.    Encounter Medications:   Outpatient Encounter Medications as of 04/16/2018  Medication Sig  . allopurinol (ZYLOPRIM) 300 MG tablet Take 300 mg by mouth daily.  [name] amLODipine (NORVASC) 5 MG tablet Take 1 tablet (5 mg total) by mouth daily.  [name] atorvastatin (LIPITOR) 80 MG tablet Take 80 mg by mouth daily.  . cloNIDine (CATAPRES) 0.3  MG tablet Take 1 tablet (0.3 mg total) by mouth 3 (three) times daily.  . fluticasone (FLONASE) 50 MCG/ACT nasal spray Place 1 spray into both nostrils daily.  [name] guaiFENesin (MUCINEX) 600 MG 12 hr tablet Take 1 tablet (600 mg total) by mouth 2 (two) times daily.  . insulin aspart (NOVOLOG) 100 UNIT/ML injection Inject 5 Units into the skin 3 (three) times daily before meals.  . insulin detemir (LEVEMIR) 100 UNIT/ML injection Inject 10 Units into the skin at bedtime.  . Insulin Pen Needle 31G X 5 MM MISC Use as directed.  . labetalol (NORMODYNE) 200 MG tablet Take 2 tablets (400 mg total) by mouth 3 (three) times daily. (Patient taking differently: Take 300 mg by mouth 2 (two) times daily. )  . linaclotide (LINZESS) 145 MCG CAPS capsule Take 1 capsule (145 mcg total) by mouth daily before breakfast.  . losartan (COZAAR) 100 MG tablet Take 1 tablet (100 mg total) by mouth daily.  . sodium bicarbonate 650 MG tablet Take 650 mg by mouth 2 (two) times daily.  [name] torsemide (DEMADEX) 20 MG tablet Take 2 tablets (40 mg total) by mouth daily.  . hydrALAZINE (APRESOLINE) 50 MG tablet Take 50 mg by mouth 3 (three) times daily.   No facility-administered encounter medications on file as of 04/16/2018.     Functional Status:   In your present state of health, do you have any difficulty performing the following activities: 03/26/2018  Hearing? N  Vision? N  Difficulty concentrating or making decisions? N  Walking or climbing stairs? N  Dressing or bathing? N  Doing errands, shopping? N  Some recent data might be hidden    Fall/Depression Screening:    Fall Risk  04/16/2018  Falls in the past year? No  Risk for fall due to : Medication side effect   PHQ 2/9 Scores 04/16/2018  PHQ - 2 Score 0    Assessment:  RN CM observed and reviewed medication bottles, pt has been recording weight, blood pressure, CBG on paper but difficult to read, pt will start recording this information in [facility] calendar and take  to MD appointments.  Pt checked CBG during visit with reading of "HI", took insulin, checked later during visit and still " HI"  RN CM talked with pt about going to ED if CBG does not come down, pt states he wants to wait and see if CBG comes down.  RN CM called Dr. Emilee Hero office, left voicemail, ask if process for pt being seen as new pt could be completed sooner than 3-4 weeks as reported by pt, reported pt has completed forms and awaiting appointment and pt has not had a hospital follow up and has CBG readings "HI" today. Upon review of medications, noted pt using expired strips, these were thrown away and pt opened new package of strips, to check CBG noted pt using pad of fingers, RN CM ask pt to use tip end of fingers and rotate sites.  Pt agreeable to see podiatrist and will discuss with new primary care MD, RN CM to assist with process if needed.  RN CM faxed initial home visit/ barrier letter to Dr. Sherrie Sport, (unable to fax to Dr. Everette Rank) RN CM called pt back later in afternoon to inquire about CBG, pt states he will be checking in few minutes and will call RN CM back with reading, pt called back and states still reads " HI", pt states he is going to increase his dosage of insulin and if CBG does not come down, will consider ED as RN CM instructed.  [facility] CM Care Plan Problem One     Most Recent Value  Care Plan Problem One  Knowledge deficit related to CHF  Role Documenting the Problem One  Care Management Coordinator  Care Plan for Problem One  Active  THN Long Term Goal   Pt will demonstrate/ verbalize improved self care for CHF aeb by better outcomes within 60 days  THN Long Term Goal Start Date  04/09/18  Interventions for Problem One Long Term Goal  RN CM gave 24 hour nurse line, gave [facility] calendar and reviewed with pt, section for recording weight, BP, CBG, gave EMMI handouts and reviewed with pt  [facility]N CM Short Term Goal #1   Pt will verbalize CHF action plan/ zones within 30 days  THN CM  Short Term Goal #1 Start Date  04/09/18  Interventions for Short Term Goal #1  RN CM gave cope of CHF action plan/ zones, reviewed with pt.     [facility]N CM Care Plan Problem Two     Most Recent Value  Care Plan Problem Two  Knowledge deficit related to diabetes  Role Documenting the Problem Two  Care Management Coordinator  Care Plan for Problem Two  Active  THN CM Short Term Goal #1   Pt will verbalize food choices for carbohydrate modified diet/ plate method within 30 days  [facility]N CM Short Term Goal #1 Start Date  04/09/18  Interventions for Short Term Goal #2   RN CM gave diabetes booklet and reviewed with pt, ask pt to read over before next home visit, reviewed carbohydrate counting, reading labels, plate method., gave low sodium poster and reviewed, reviewed 2 liters/ day fluid restricitons  [facility]N CM Short Term Goal #2   Pt will verbalize/ demonstrate interventions to facilitate weight loss within 30 days.  [facility]N CM Short Term Goal #2 Start Date  04/16/18  Interventions for Short Term Goal #2  RN CM reviewed importance of being mobile, walking daily (pt already walks some), portion control, healthy food choices      Plan: continue weekly transition of care See pt for home visit next month Assess CBG, weight, BP log Take medication box  Jacqlyn Larsen [facility], [place] Coordinator [phone]

## 2018-04-22 DIAGNOSIS — N183 Chronic kidney disease, stage 3 (moderate): Secondary | ICD-10-CM | POA: Diagnosis not present

## 2018-04-22 DIAGNOSIS — E1022 Type 1 diabetes mellitus with diabetic chronic kidney disease: Secondary | ICD-10-CM | POA: Diagnosis not present

## 2018-04-22 DIAGNOSIS — E785 Hyperlipidemia, unspecified: Secondary | ICD-10-CM | POA: Diagnosis not present

## 2018-04-22 DIAGNOSIS — I10 Essential (primary) hypertension: Secondary | ICD-10-CM | POA: Diagnosis not present

## 2018-04-23 ENCOUNTER — Other Ambulatory Visit: Payer: Self-pay | Admitting: *Deleted

## 2018-04-23 NOTE — Patient Outreach (Addendum)
Telephone call to pt for transition of care week 3, spoke with pt, HIPAA verified, pt states he is now a patient of Dr. Everette Rank and saw primary MD yesterday. Pt reports MD gave him sliding scale for insulin and CBG today 221, yesterday CBG went up to 400 because of eating sweets, pt reports he is being careful with 2 liters fluid restriction daily   Pt reports weight today 290 pounds (6 pound weight gain in one week), RN CM talked with pt about following action plan and calling MD early on for changes in health status, discussed would be of benefit to pt to obtain cardiologist, pt does not seem interested at present.  RN CM called Dr. Everette Rank office, left voicemail with triage nurse Magda Paganini and reported 6 pound weight gain in one week and pt states ankles are slightly puffy.  RN CM was not aware until after telephone conversation with pt that Dr. Everette Rank is not in [place] (MD previously was in network but not at present).  RN CM called pt back to inform primary care MD not in network and case will be closed, no answer to telephone, left voicemail requesting return phone call. Magda Paganini from Dr. Julianne Rice office called back and states Dr. Maudie Mercury is in [facility] network and is medical director of [place] clinic and in [name], Magda Paganini states she is going to check into referral with HF clinic to be a part of their diuretic titration protocol.  Magda Paganini called back and states there is referral for home health to get protocol started ([facility]) but unsure when they will see pt for start of care. RN CM sent message to Freda Jackson with [facility] to cancel case closure letter and do not mail.    [facility]N CM Care Plan Problem One     Most Recent Value  Care Plan Problem One  Knowledge deficit related to CHF  Role Documenting the Problem One  Care Management Coordinator  Care Plan for Problem One  Active  THN Long Term Goal   Pt will demonstrate/ verbalize improved self care for CHF aeb by better outcomes within 60 days  THN  Long Term Goal Start Date  04/09/18  Interventions for Problem One Long Term Goal  RN CM called Dr. Everette Rank office, reported 6 pound weight gain, talked with pt about benefits of having a cardiologist/ HF clinic.  [facility]N CM Short Term Goal #1   Pt will verbalize CHF action plan/ zones within 30 days  [facility]N CM Short Term Goal #1 Start Date  04/09/18  Interventions for Short Term Goal #1  RN CM reinforced CHF zones/ action plan, importance of knowing how you feel daily and communicating with MD    [facility] CM Care Plan Problem Two     Most Recent Value  Care Plan Problem Two  Knowledge deficit related to diabetes  Role Documenting the Problem Two  Care Management Coordinator  Care Plan for Problem Two  Active  THN CM Short Term Goal #1   Pt will verbalize food choices for carbohydrate modified diet/ plate method within 30 days  THN CM Short Term Goal #1 Start Date  04/09/18  Interventions for Short Term Goal #2   RN CM reinforced plate method  THN CM Short Term Goal #2   Pt will verbalize/ demonstrate interventions to facilitate weight loss within 30 days.  [facility]N CM Short Term Goal #2 Start Date  04/16/18  Interventions for Short Term Goal #2  RN CM reinforced importance  of staying mobile, walking daily      PLAN Close case, pt does not have [facility] primary care provider RN CM mailed case closure letter to patient's home ---Late entry---  Case not closed as pt is affiliated with Dr. [name] for primary care.  Jacqlyn Larsen [facility], [place] Coordinator [phone]

## 2018-04-28 ENCOUNTER — Other Ambulatory Visit: Payer: Self-pay | Admitting: *Deleted

## 2018-04-28 NOTE — Patient Outreach (Signed)
Telephone call to pt for transition of care week 4, spoke with pt, HIPAA verified, pt reports he continues weighing daily, weight today 290 pounds, CBG 100's range today per pt, does not remember the exact number, reports CBG yesterday 170  Pt states " it's much better"  Pt reports he has gout to right hand and called into MD office for refill gout medication, states " I've been out of that medicine for several days but hopefully they're calling some in"  RN CM reminded pt to keep all medications refilled and current and importance of taking as prescribed.  [facility]N CM Care Plan Problem One     Most Recent Value  Care Plan Problem One  Knowledge deficit related to CHF  Role Documenting the Problem One  Care Management Coordinator  Care Plan for Problem One  Active  THN Long Term Goal   Pt will demonstrate/ verbalize improved self care for CHF aeb by better outcomes within 60 days  THN Long Term Goal Start Date  04/09/18  Interventions for Problem One Long Term Goal  Pt states he was directed by primary MD to increase torsemide to 3 pills x 2 days, then resume normal dosage, pt states that is what he did and now back at normal dosing.  Pt reports weight today 290 pounds.  [facility]N CM Short Term Goal #1   Pt will verbalize CHF action plan/ zones within 30 days  THN CM Short Term Goal #1 Start Date  04/09/18  Interventions for Short Term Goal #1  RN CM reinforced HF action plan with emphasis on yellow zone, reminded pt to call early for change in health status, symptoms.    [facility]N CM Care Plan Problem Two     Most Recent Value  Care Plan Problem Two  Knowledge deficit related to diabetes  Role Documenting the Problem Two  Care Management Coordinator  Care Plan for Problem Two  Active  THN CM Short Term Goal #1   Pt will verbalize food choices for carbohydrate modified diet/ plate method within 30 days  [facility]N CM Short Term Goal #1 Start Date  04/09/18  Interventions for Short Term Goal #2   RN CM reinforced plate  method /  portion control  [facility]N CM Short Term Goal #2   Pt will verbalize/ demonstrate interventions to facilitate weight loss within 30 days.  [facility]N CM Short Term Goal #2 Start Date  04/16/18  Interventions for Short Term Goal #2  RN CM reminded pt to walk daily and talk with MD about level of physical activity     PLAN See pt for home visit this month  Jacqlyn Larsen [facility], [place] Coordinator [phone]

## 2018-05-03 DIAGNOSIS — E1065 Type 1 diabetes mellitus with hyperglycemia: Secondary | ICD-10-CM | POA: Diagnosis not present

## 2018-05-03 DIAGNOSIS — I509 Heart failure, unspecified: Secondary | ICD-10-CM | POA: Diagnosis not present

## 2018-05-03 DIAGNOSIS — I10 Essential (primary) hypertension: Secondary | ICD-10-CM | POA: Diagnosis not present

## 2018-05-03 DIAGNOSIS — M10042 Idiopathic gout, left hand: Secondary | ICD-10-CM | POA: Diagnosis not present

## 2018-05-03 DIAGNOSIS — Z794 Long term (current) use of insulin: Secondary | ICD-10-CM | POA: Diagnosis not present

## 2018-05-03 DIAGNOSIS — Z79899 Other long term (current) drug therapy: Secondary | ICD-10-CM | POA: Diagnosis not present

## 2018-05-04 DIAGNOSIS — E559 Vitamin D deficiency, unspecified: Secondary | ICD-10-CM | POA: Diagnosis not present

## 2018-05-04 DIAGNOSIS — R809 Proteinuria, unspecified: Secondary | ICD-10-CM | POA: Diagnosis not present

## 2018-05-04 DIAGNOSIS — D509 Iron deficiency anemia, unspecified: Secondary | ICD-10-CM | POA: Diagnosis not present

## 2018-05-04 DIAGNOSIS — Z79899 Other long term (current) drug therapy: Secondary | ICD-10-CM | POA: Diagnosis not present

## 2018-05-04 DIAGNOSIS — N184 Chronic kidney disease, stage 4 (severe): Secondary | ICD-10-CM | POA: Diagnosis not present

## 2018-05-04 DIAGNOSIS — I129 Hypertensive chronic kidney disease with stage 1 through stage 4 chronic kidney disease, or unspecified chronic kidney disease: Secondary | ICD-10-CM | POA: Diagnosis not present

## 2018-05-12 ENCOUNTER — Encounter: Payer: Self-pay | Admitting: *Deleted

## 2018-05-12 ENCOUNTER — Other Ambulatory Visit: Payer: Self-pay | Admitting: *Deleted

## 2018-05-12 NOTE — Patient Outreach (Signed)
[facility]) Care Management   05/12/2018  Kelly Key 01/10/1971 [id number]  Kelly Key is an 47 y.o. male  Subjective: Routine home visit with pt, HIPAA verified, pt reports weighing daily and recording but cannot find weight log, weight ranges 290-293 pounds, CBG last hs per pt is 142, pt states checking BP daily and logging and taking readings to MD appointments.  Pt reports he saw primary care MD for gout in his hand and was on methylpred 58m and finished taking and on naproxen 5035mBID prn, reports " gout is better, I feel better"    Objective:   Vitals:   05/12/18 1412  BP: (!) 148/80  Pulse: 78  Resp: 18  SpO2: 98%  Weight: 293 lb (132.9 kg)  CBG fasting ranges 103-306 Random ranges 183-340  ROS  Physical Exam  Constitutional: He is oriented to person, place, and time. He appears well-developed and well-nourished.  HENT:  Head: Normocephalic.  Neck: Normal range of motion. Neck supple.  Cardiovascular: Normal rate and regular rhythm.  Respiratory: Effort normal and breath sounds normal.  GI: Soft. Bowel sounds are normal.  Musculoskeletal: Normal range of motion. He exhibits edema.  1+ edema ankle area bil  Neurological: He is alert and oriented to person, place, and time.  Skin: Skin is warm and dry.  Psychiatric: He has a normal mood and affect. His behavior is normal. Judgment and thought content normal.    Encounter Medications:   Outpatient Encounter Medications as of 05/12/2018  Medication Sig  . allopurinol (ZYLOPRIM) 300 MG tablet Take 300 mg by mouth daily.  . Marland Kitchentorvastatin (LIPITOR) 80 MG tablet Take 80 mg by mouth daily.  . insulin aspart (NOVOLOG) 100 UNIT/ML injection Inject 5 Units into the skin 3 (three) times daily before meals.  . insulin detemir (LEVEMIR) 100 UNIT/ML injection Inject 10 Units into the skin at bedtime.  . Insulin Pen Needle 31G X 5 MM MISC Use as directed.  . linaclotide (LINZESS) 145 MCG CAPS capsule Take 1  capsule (145 mcg total) by mouth daily before breakfast.  . sodium bicarbonate 650 MG tablet Take 650 mg by mouth 2 (two) times daily.  . Marland KitchenmLODipine (NORVASC) 5 MG tablet Take 1 tablet (5 mg total) by mouth daily.  . cloNIDine (CATAPRES) 0.3 MG tablet Take 1 tablet (0.3 mg total) by mouth 3 (three) times daily. (Patient taking differently: Take 0.2 mg by mouth 3 (three) times daily. )  . fluticasone (FLONASE) 50 MCG/ACT nasal spray Place 1 spray into both nostrils daily.  . hydrALAZINE (APRESOLINE) 50 MG tablet Take 50 mg by mouth 3 (three) times daily.  . Marland Kitchenabetalol (NORMODYNE) 200 MG tablet Take 2 tablets (400 mg total) by mouth 3 (three) times daily. (Patient taking differently: Take 300 mg by mouth 2 (two) times daily. )  . losartan (COZAAR) 100 MG tablet Take 1 tablet (100 mg total) by mouth daily.  . Marland Kitchenorsemide (DEMADEX) 20 MG tablet Take 2 tablets (40 mg total) by mouth daily.   No facility-administered encounter medications on file as of 05/12/2018.     Functional Status:   In your present state of health, do you have any difficulty performing the following activities: 04/26/2018 03/26/2018  Hearing? N N  Vision? N N  Difficulty concentrating or making decisions? N N  Walking or climbing stairs? N N  Dressing or bathing? N N  Doing errands, shopping? N N  Preparing Food and eating ? N -  Using the  Toilet? N -  In the past six months, have you accidently leaked urine? N -  Do you have problems with loss of bowel control? N -  Managing your Medications? N -  Managing your Finances? N -  Housekeeping or managing your Housekeeping? N -  Some recent data might be hidden    Fall/Depression Screening:    Fall Risk  05/12/2018 04/16/2018  Falls in the past year? No No  Risk for fall due to : Medication side effect Medication side effect   PHQ 2/9 Scores 04/16/2018  PHQ - 2 Score 0    Assessment:  RN CM reviewed medications with pt, reviewed BP, CBG log, last CBG logged is 05/08/18, BP  ranges 102/75-171/109 (pt states he took log to MD appointment).  RN CM encouraged pt to continue weighing daily, checking CBG daily, BP daily and keeping a log of all and take to all MD appointments.  RN CM ask pt to work with [facility] for HF protocol.  RN CM faxed today's visit note to primary MD Dr. Maudie Mercury.  THN CM Care Plan Problem One     Most Recent Value  Care Plan Problem One  Knowledge deficit related to CHF  Role Documenting the Problem One  Care Management Coordinator  Care Plan for Problem One  Active  THN Long Term Goal   Pt will demonstrate/ verbalize improved self care for CHF aeb by better outcomes within 60 days  THN Long Term Goal Start Date  04/09/18  Interventions for Problem One Long Term Goal  RN CM talked with pt about answering the phone for [facility] as they have been trying to reach pt,  pt states he talked with [facility] and they will be out to see him for home visit next Wednesday 05/26/18.  RN CM called Dr. Julianne Rice office, spoke with Magda Paganini- nurse- reported this information about home health, reported weight is 293 pounds, there are sitll some elevated blood pressue readings.  THN CM Short Term Goal #1   Pt will verbalize CHF action plan/ zones within 30 days  THN CM Short Term Goal #1 Start Date  05/12/18 [goal re-established]  Interventions for Short Term Goal #1   RN CM reinforced action plan, reviewed resources to call and importance of calling early for change in health status    [facility] CM Care Plan Problem Two     Most Recent Value  Care Plan Problem Two  Knowledge deficit related to diabetes  Role Documenting the Problem Two  Care Management Coordinator  Care Plan for Problem Two  Active  THN CM Short Term Goal #1   Pt will verbalize food choices for carbohydrate modified diet/ plate method within 30 days  THN CM Short Term Goal #1 Start Date  04/09/18  [facility] CM Short Term Goal #1 Met Date   05/12/18  Interventions for Short Term Goal #2   RN CM  reviewed plate method, nutritious food choices, being mindful of carbohydrate intake, reviewed that methylpred pt recently took can elevate CBG  THN CM Short Term Goal #2   Pt will verbalize/ demonstrate interventions to facilitate weight loss within 30 days.  THN CM Short Term Goal #2 Start Date  05/12/18  Interventions for Short Term Goal #2  RN CM reviewed importance of being mobile, walking inside with hot weather outside.      Plan: see pt for home visit next month  Jacqlyn Larsen [facility], [place] Coordinator [phone]

## 2018-05-19 ENCOUNTER — Telehealth (HOSPITAL_COMMUNITY): Payer: Self-pay | Admitting: Pharmacist

## 2018-05-19 DIAGNOSIS — E785 Hyperlipidemia, unspecified: Secondary | ICD-10-CM | POA: Diagnosis not present

## 2018-05-19 DIAGNOSIS — N183 Chronic kidney disease, stage 3 (moderate): Secondary | ICD-10-CM | POA: Diagnosis not present

## 2018-05-19 DIAGNOSIS — M109 Gout, unspecified: Secondary | ICD-10-CM | POA: Diagnosis not present

## 2018-05-19 DIAGNOSIS — E1022 Type 1 diabetes mellitus with diabetic chronic kidney disease: Secondary | ICD-10-CM | POA: Diagnosis not present

## 2018-05-19 DIAGNOSIS — I5042 Chronic combined systolic (congestive) and diastolic (congestive) heart failure: Secondary | ICD-10-CM | POA: Diagnosis not present

## 2018-05-19 DIAGNOSIS — I5032 Chronic diastolic (congestive) heart failure: Secondary | ICD-10-CM | POA: Diagnosis not present

## 2018-05-19 DIAGNOSIS — I5043 Acute on chronic combined systolic (congestive) and diastolic (congestive) heart failure: Secondary | ICD-10-CM | POA: Diagnosis not present

## 2018-05-19 DIAGNOSIS — I130 Hypertensive heart and chronic kidney disease with heart failure and stage 1 through stage 4 chronic kidney disease, or unspecified chronic kidney disease: Secondary | ICD-10-CM | POA: Diagnosis not present

## 2018-05-19 NOTE — Telephone Encounter (Signed)
[facility] HF visit  Patient weight up and ReDS reading 51% Currently taking Torsemide 40mg  daily Per protocol will take extra 40mg  tonight and will have f/u  [facility] visit tomorrow

## 2018-05-20 ENCOUNTER — Telehealth (HOSPITAL_COMMUNITY): Payer: Self-pay | Admitting: Pharmacist

## 2018-05-20 DIAGNOSIS — Z794 Long term (current) use of insulin: Secondary | ICD-10-CM | POA: Diagnosis not present

## 2018-05-20 DIAGNOSIS — E1022 Type 1 diabetes mellitus with diabetic chronic kidney disease: Secondary | ICD-10-CM | POA: Diagnosis not present

## 2018-05-20 DIAGNOSIS — K5900 Constipation, unspecified: Secondary | ICD-10-CM | POA: Diagnosis not present

## 2018-05-20 DIAGNOSIS — N183 Chronic kidney disease, stage 3 (moderate): Secondary | ICD-10-CM | POA: Diagnosis not present

## 2018-05-20 DIAGNOSIS — Z79899 Other long term (current) drug therapy: Secondary | ICD-10-CM | POA: Diagnosis not present

## 2018-05-20 DIAGNOSIS — E785 Hyperlipidemia, unspecified: Secondary | ICD-10-CM | POA: Diagnosis not present

## 2018-05-20 DIAGNOSIS — I130 Hypertensive heart and chronic kidney disease with heart failure and stage 1 through stage 4 chronic kidney disease, or unspecified chronic kidney disease: Secondary | ICD-10-CM | POA: Diagnosis not present

## 2018-05-20 DIAGNOSIS — I10 Essential (primary) hypertension: Secondary | ICD-10-CM | POA: Diagnosis not present

## 2018-05-20 DIAGNOSIS — E1065 Type 1 diabetes mellitus with hyperglycemia: Secondary | ICD-10-CM | POA: Diagnosis not present

## 2018-05-20 DIAGNOSIS — M109 Gout, unspecified: Secondary | ICD-10-CM | POA: Diagnosis not present

## 2018-05-20 DIAGNOSIS — I5032 Chronic diastolic (congestive) heart failure: Secondary | ICD-10-CM | POA: Diagnosis not present

## 2018-05-20 DIAGNOSIS — I509 Heart failure, unspecified: Secondary | ICD-10-CM | POA: Diagnosis not present

## 2018-05-20 DIAGNOSIS — M10042 Idiopathic gout, left hand: Secondary | ICD-10-CM | POA: Diagnosis not present

## 2018-05-20 NOTE — Telephone Encounter (Signed)
[facility]  ReDS vest reading 51>48% Wt down 1lb 1+ B/L LE edema  Cr stable 2.3 (BL from hospitalization 6/19)  Repeat torsemide 40mg  BID today  Regular Torsemide 40mg  Daily

## 2018-05-21 ENCOUNTER — Telehealth (HOSPITAL_COMMUNITY): Payer: Self-pay | Admitting: Pharmacist

## 2018-05-21 DIAGNOSIS — N183 Chronic kidney disease, stage 3 (moderate): Secondary | ICD-10-CM | POA: Diagnosis not present

## 2018-05-21 DIAGNOSIS — E1022 Type 1 diabetes mellitus with diabetic chronic kidney disease: Secondary | ICD-10-CM | POA: Diagnosis not present

## 2018-05-21 DIAGNOSIS — M109 Gout, unspecified: Secondary | ICD-10-CM | POA: Diagnosis not present

## 2018-05-21 DIAGNOSIS — I5032 Chronic diastolic (congestive) heart failure: Secondary | ICD-10-CM | POA: Diagnosis not present

## 2018-05-21 DIAGNOSIS — E785 Hyperlipidemia, unspecified: Secondary | ICD-10-CM | POA: Diagnosis not present

## 2018-05-21 DIAGNOSIS — I130 Hypertensive heart and chronic kidney disease with heart failure and stage 1 through stage 4 chronic kidney disease, or unspecified chronic kidney disease: Secondary | ICD-10-CM | POA: Diagnosis not present

## 2018-05-21 MED ORDER — METOLAZONE 5 MG PO TABS: 2.5000 mg | ORAL_TABLET | ORAL | 2 refills | Status: DC | PRN

## 2018-05-21 NOTE — Telephone Encounter (Signed)
HHRN [facility] called from patient home ReDS reading back up to 51% Weight unchanged from day before,  Same 1+ LE edema Same SOB  Will continue Torsemide 40mg  BID add metolazone 2.5mg  x1 today  Dry weight 129kg dc 04/16/18 7/17 wt 133kg

## 2018-05-25 DIAGNOSIS — I130 Hypertensive heart and chronic kidney disease with heart failure and stage 1 through stage 4 chronic kidney disease, or unspecified chronic kidney disease: Secondary | ICD-10-CM | POA: Diagnosis not present

## 2018-05-25 DIAGNOSIS — N183 Chronic kidney disease, stage 3 (moderate): Secondary | ICD-10-CM | POA: Diagnosis not present

## 2018-05-25 DIAGNOSIS — E1022 Type 1 diabetes mellitus with diabetic chronic kidney disease: Secondary | ICD-10-CM | POA: Diagnosis not present

## 2018-05-25 DIAGNOSIS — M109 Gout, unspecified: Secondary | ICD-10-CM | POA: Diagnosis not present

## 2018-05-25 DIAGNOSIS — I5032 Chronic diastolic (congestive) heart failure: Secondary | ICD-10-CM | POA: Diagnosis not present

## 2018-05-25 DIAGNOSIS — E785 Hyperlipidemia, unspecified: Secondary | ICD-10-CM | POA: Diagnosis not present

## 2018-05-31 ENCOUNTER — Other Ambulatory Visit (HOSPITAL_COMMUNITY): Payer: Self-pay | Admitting: Internal Medicine

## 2018-05-31 DIAGNOSIS — I5032 Chronic diastolic (congestive) heart failure: Secondary | ICD-10-CM | POA: Diagnosis not present

## 2018-05-31 DIAGNOSIS — N183 Chronic kidney disease, stage 3 (moderate): Secondary | ICD-10-CM | POA: Diagnosis not present

## 2018-05-31 DIAGNOSIS — E785 Hyperlipidemia, unspecified: Secondary | ICD-10-CM | POA: Diagnosis not present

## 2018-05-31 DIAGNOSIS — I130 Hypertensive heart and chronic kidney disease with heart failure and stage 1 through stage 4 chronic kidney disease, or unspecified chronic kidney disease: Secondary | ICD-10-CM | POA: Diagnosis not present

## 2018-05-31 DIAGNOSIS — M109 Gout, unspecified: Secondary | ICD-10-CM | POA: Diagnosis not present

## 2018-05-31 DIAGNOSIS — E1022 Type 1 diabetes mellitus with diabetic chronic kidney disease: Secondary | ICD-10-CM | POA: Diagnosis not present

## 2018-06-03 DIAGNOSIS — E1022 Type 1 diabetes mellitus with diabetic chronic kidney disease: Secondary | ICD-10-CM | POA: Diagnosis not present

## 2018-06-03 DIAGNOSIS — N183 Chronic kidney disease, stage 3 (moderate): Secondary | ICD-10-CM | POA: Diagnosis not present

## 2018-06-03 DIAGNOSIS — M109 Gout, unspecified: Secondary | ICD-10-CM | POA: Diagnosis not present

## 2018-06-03 DIAGNOSIS — E785 Hyperlipidemia, unspecified: Secondary | ICD-10-CM | POA: Diagnosis not present

## 2018-06-03 DIAGNOSIS — I5032 Chronic diastolic (congestive) heart failure: Secondary | ICD-10-CM | POA: Diagnosis not present

## 2018-06-03 DIAGNOSIS — I130 Hypertensive heart and chronic kidney disease with heart failure and stage 1 through stage 4 chronic kidney disease, or unspecified chronic kidney disease: Secondary | ICD-10-CM | POA: Diagnosis not present

## 2018-06-08 ENCOUNTER — Other Ambulatory Visit: Payer: Self-pay | Admitting: *Deleted

## 2018-06-08 ENCOUNTER — Encounter: Payer: Self-pay | Admitting: *Deleted

## 2018-06-08 VITALS — BP 124/70 | HR 92 | Resp 18 | Wt 292.0 lb

## 2018-06-08 DIAGNOSIS — I509 Heart failure, unspecified: Secondary | ICD-10-CM

## 2018-06-08 NOTE — Patient Outreach (Signed)
[facility]) Care Management   06/08/2018  Kelly Key 1970-12-18 [id number]  Kelly Key is an 47 y.o. male  Subjective: Routine home visit with pt, HIPAA verified, patient's girlfriend Ranessa present, pt reports he continues to weigh daily, home health RN continues to see pt weekly, pt reports he is checking CBG but not recording with reading of 221 today.  Pt states he is now on new medication for gout- cholchicine and will pickup from pharmacy, no longer taking allopurinol, pt reports he is to also pickup clonidine.  Objective:   Vitals:   06/08/18 1202  BP: 124/70  Pulse: 92  Resp: 18  SpO2: 95%  Weight: 292 lb (132.5 kg)   ROS  Physical Exam  Constitutional: He is oriented to person, place, and time. He appears well-developed and well-nourished.  HENT:  Head: Normocephalic.  Neck: Normal range of motion. Neck supple.  Cardiovascular: Normal rate.  Respiratory: Effort normal and breath sounds normal.  GI: Soft. Bowel sounds are normal.  Musculoskeletal: He exhibits edema.  1+ edema RLE  Neurological: He is alert and oriented to person, place, and time.  Skin: Skin is warm.  Psychiatric: He has a normal mood and affect. His behavior is normal. Thought content normal.    Encounter Medications:   Outpatient Encounter Medications as of 06/08/2018  Medication Sig  . atorvastatin (LIPITOR) 80 MG tablet Take 80 mg by mouth daily.  . insulin aspart (NOVOLOG) 100 UNIT/ML injection Inject 5 Units into the skin 3 (three) times daily before meals.  . insulin detemir (LEVEMIR) 100 UNIT/ML injection Inject 10 Units into the skin at bedtime.  . Insulin Pen Needle 31G X 5 MM MISC Use as directed.  . linaclotide (LINZESS) 145 MCG CAPS capsule Take 1 capsule (145 mcg total) by mouth daily before breakfast.  . metolazone (ZAROXOLYN) 5 MG tablet Take 0.5 tablets (2.5 mg total) by mouth as needed (when directed by MD for extra weight gain).  . sodium bicarbonate 650 MG  tablet Take 650 mg by mouth 2 (two) times daily.  [name] allopurinol (ZYLOPRIM) 300 MG tablet Take 300 mg by mouth daily.  [name] amLODipine (NORVASC) 5 MG tablet Take 1 tablet (5 mg total) by mouth daily.  . cloNIDine (CATAPRES) 0.3 MG tablet Take 1 tablet (0.3 mg total) by mouth 3 (three) times daily. (Patient taking differently: Take 0.2 mg by mouth 3 (three) times daily. )  . fluticasone (FLONASE) 50 MCG/ACT nasal spray Place 1 spray into both nostrils daily.  . hydrALAZINE (APRESOLINE) 50 MG tablet Take 50 mg by mouth 3 (three) times daily.  [name] labetalol (NORMODYNE) 200 MG tablet Take 2 tablets (400 mg total) by mouth 3 (three) times daily. (Patient taking differently: Take 300 mg by mouth 2 (two) times daily. )  . losartan (COZAAR) 100 MG tablet Take 1 tablet (100 mg total) by mouth daily.  [name] torsemide (DEMADEX) 20 MG tablet Take 2 tablets (40 mg total) by mouth daily.   No facility-administered encounter medications on file as of 06/08/2018.     Functional Status:   In your present state of health, do you have any difficulty performing the following activities: 04/26/2018 03/26/2018  Hearing? N N  Vision? N N  Difficulty concentrating or making decisions? N N  Walking or climbing stairs? N N  Dressing or bathing? N N  Doing errands, shopping? N N  Preparing Food and eating ? N -  Using the Toilet? N -  In the  past six months, have you accidently leaked urine? N -  Do you have problems with loss of bowel control? N -  Managing your Medications? N -  Managing your Finances? N -  Housekeeping or managing your Housekeeping? N -  Some recent data might be hidden    Fall/Depression Screening:    Fall Risk  05/12/2018 04/16/2018  Falls in the past year? No No  Risk for fall due to : Medication side effect Medication side effect   PHQ 2/9 Scores 04/16/2018  PHQ - 2 Score 0    Assessment:  RN CM reviewed medications with pt and girlfriend Ranessa, pt is to pickup colchicine, clonidine at  pharmacy, RN CM reviewed importance of following HF action plan and taking metolazone as needed for weight gain.  RN CM discussed importance of keeping good records and taking logs to MD appointments, importance of taking medications as prescribed.  Pt does have information for HF protocol through [facility] and can receive IV lasix in the home.  RN CM reviewed discharge plan and pt would like outreach from [employer] coach.  THN CM Care Plan Problem One     Most Recent Value  Care Plan Problem One  Knowledge deficit related to CHF  Role Documenting the Problem One  Care Management [place] for Problem One  Active  THN Long Term Goal   Pt will demonstrate/ verbalize improved self care for CHF aeb by better outcomes within 60 days  THN Long Term Goal Start Date  06/08/18 Barrie Folk re-established]  Interventions for Problem One Long Term Goal  RN CM reviewed importance of continued daily weights, working with home health RN to improve outcomes,  RN CM called [facility] home health RN Elza Rafter at [phone], no answer to telephone, left voicemail requesting return phone call for collaboration.  THN CM Short Term Goal #1   Pt will verbalize CHF action plan/ zones within 30 days  THN CM Short Term Goal #1 Start Date  06/08/18 [goal re-established]  Interventions for Short Term Goal #1  RN CM reviewed HF action plan with empahsis on yellow zone    [facility] CM Care Plan Problem Two     Most Recent Value  THN CM Short Term Goal #2   Pt will verbalize/ demonstrate interventions to facilitate weight loss within 30 days.  THN CM Short Term Goal #2 Start Date  05/12/18  [facility] CM Short Term Goal #2 Met Date  06/08/18  Interventions for Short Term Goal #2  pt verbalizes importance of portion control, walking daily      Plan: transfer to RN health coach  Jacqlyn Larsen [facility], BSN [place] Coordinator [phone]

## 2018-06-09 DIAGNOSIS — M109 Gout, unspecified: Secondary | ICD-10-CM | POA: Diagnosis not present

## 2018-06-09 DIAGNOSIS — E1022 Type 1 diabetes mellitus with diabetic chronic kidney disease: Secondary | ICD-10-CM | POA: Diagnosis not present

## 2018-06-09 DIAGNOSIS — I5032 Chronic diastolic (congestive) heart failure: Secondary | ICD-10-CM | POA: Diagnosis not present

## 2018-06-09 DIAGNOSIS — N183 Chronic kidney disease, stage 3 (moderate): Secondary | ICD-10-CM | POA: Diagnosis not present

## 2018-06-09 DIAGNOSIS — I130 Hypertensive heart and chronic kidney disease with heart failure and stage 1 through stage 4 chronic kidney disease, or unspecified chronic kidney disease: Secondary | ICD-10-CM | POA: Diagnosis not present

## 2018-06-09 DIAGNOSIS — E1122 Type 2 diabetes mellitus with diabetic chronic kidney disease: Secondary | ICD-10-CM | POA: Diagnosis not present

## 2018-06-09 DIAGNOSIS — E7849 Other hyperlipidemia: Secondary | ICD-10-CM | POA: Diagnosis not present

## 2018-06-09 DIAGNOSIS — N182 Chronic kidney disease, stage 2 (mild): Secondary | ICD-10-CM | POA: Diagnosis not present

## 2018-06-09 DIAGNOSIS — E785 Hyperlipidemia, unspecified: Secondary | ICD-10-CM | POA: Diagnosis not present

## 2018-06-09 DIAGNOSIS — I10 Essential (primary) hypertension: Secondary | ICD-10-CM | POA: Diagnosis not present

## 2018-06-14 DIAGNOSIS — I130 Hypertensive heart and chronic kidney disease with heart failure and stage 1 through stage 4 chronic kidney disease, or unspecified chronic kidney disease: Secondary | ICD-10-CM | POA: Diagnosis not present

## 2018-06-14 DIAGNOSIS — E785 Hyperlipidemia, unspecified: Secondary | ICD-10-CM | POA: Diagnosis not present

## 2018-06-14 DIAGNOSIS — N183 Chronic kidney disease, stage 3 (moderate): Secondary | ICD-10-CM | POA: Diagnosis not present

## 2018-06-14 DIAGNOSIS — I5032 Chronic diastolic (congestive) heart failure: Secondary | ICD-10-CM | POA: Diagnosis not present

## 2018-06-14 DIAGNOSIS — M109 Gout, unspecified: Secondary | ICD-10-CM | POA: Diagnosis not present

## 2018-06-14 DIAGNOSIS — E1022 Type 1 diabetes mellitus with diabetic chronic kidney disease: Secondary | ICD-10-CM | POA: Diagnosis not present

## 2018-06-18 ENCOUNTER — Other Ambulatory Visit: Payer: Self-pay

## 2018-06-18 NOTE — Patient Outreach (Signed)
[facility]) Care Management  06/18/2018   MEER REINDL [birth year] [id number]      Outreach attempt # 1 to the patient for initial assessment.  Patient able to verify HIPAA.  Explained to the patient health coach role and [facility] services.  Patient is open to the call and verbally agreed to services.  Patient stated that he would  Like for [name] to complete the initial assessment.  (she is on the consent).  Social: The patient lives in the home with his significant other [name]. He is able to perform his ADLS/IADLS independent assist. Jolayne Haines is very supportive to him.  He has transportation to his appointments. Durable medical equipment in the home consist of CBG meter, blood pressure cuff, walker and dentures.  Conditions: Per chart review the patient's conditions consist of CHF, HTN, DM type II.  The patient denies any  Pain, swelling or falls. Speaking with Jolayne Haines she states that the patient had not checked his BP, weight or blood sugar today.  I reviewed signs and symptoms of heart failure, discussed with patient CHF action plan, encouraged patient to weigh daily, verified with patient correct dose of fluid pills and encouraged medication compliance.   She verbalized understanding. She stated that the patient blood sugar yesterday 207 was fasting.   I talked with her about fasting goals and food intake. She verbalized understanding.  The patient has a nurse from [facility]  coming to the home.   Medications:Per chart review and conversing with the patient he is taking fourteen medications.  [name] manages the medications for the patient.  No questions or concerns were stated about the medications    Current Medications:  Current Outpatient Medications  Medication Sig Dispense Refill  . atorvastatin (LIPITOR) 80 MG tablet Take 80 mg by mouth daily.    . hydrALAZINE (APRESOLINE) 50 MG tablet Take 50 mg by mouth 3 (three) times daily.    . insulin aspart  (NOVOLOG) 100 UNIT/ML injection Inject 5 Units into the skin 3 (three) times daily before meals.    . insulin detemir (LEVEMIR) 100 UNIT/ML injection Inject 10 Units into the skin at bedtime.    . Insulin Pen Needle 31G X 5 MM MISC Use as directed. 100 each 0  . linaclotide (LINZESS) 145 MCG CAPS capsule Take 1 capsule (145 mcg total) by mouth daily before breakfast. 30 capsule 0  . metolazone (ZAROXOLYN) 5 MG tablet Take 0.5 tablets (2.5 mg total) by mouth as needed (when directed by MD for extra weight gain). 15 tablet 2  . sodium bicarbonate 650 MG tablet Take 650 mg by mouth 2 (two) times daily.    [name] allopurinol (ZYLOPRIM) 300 MG tablet Take 300 mg by mouth daily.    [name] amLODipine (NORVASC) 5 MG tablet Take 1 tablet (5 mg total) by mouth daily. 30 tablet 2  . cloNIDine (CATAPRES) 0.3 MG tablet Take 1 tablet (0.3 mg total) by mouth 3 (three) times daily. (Patient taking differently: Take 0.2 mg by mouth 3 (three) times daily. ) 90 tablet 0  . fluticasone (FLONASE) 50 MCG/ACT nasal spray Place 1 spray into both nostrils daily. 1 g 0  . labetalol (NORMODYNE) 200 MG tablet Take 2 tablets (400 mg total) by mouth 3 (three) times daily. (Patient taking differently: Take 300 mg by mouth 2 (two) times daily. ) 180 tablet 3  . losartan (COZAAR) 100 MG tablet Take 1 tablet (100 mg total) by mouth daily. 30 tablet 3  .  torsemide (DEMADEX) 20 MG tablet Take 2 tablets (40 mg total) by mouth daily. 60 tablet 3   No current facility-administered medications for this visit.     Functional Status:  In your present state of health, do you have any difficulty performing the following activities: 04/26/2018 03/26/2018  Hearing? N N  Vision? N N  Difficulty concentrating or making decisions? N N  Walking or climbing stairs? N N  Dressing or bathing? N N  Doing errands, shopping? N N  Preparing Food and eating ? N -  Using the Toilet? N -  In the past six months, have you accidently leaked urine? N -  Do you  have problems with loss of bowel control? N -  Managing your Medications? N -  Managing your Finances? N -  Housekeeping or managing your Housekeeping? N -  Some recent data might be hidden    Fall/Depression Screening: Fall Risk  06/18/2018 05/12/2018 04/16/2018  Falls in the past year? No No No  Risk for fall due to : - Medication side effect Medication side effect   PHQ 2/9 Scores 06/18/2018 04/16/2018  PHQ - 2 Score 0 0    Assessment: Patient will benefit from health coach outreach for disease management and support.   THN CM Care Plan Problem One     Most Recent Value  Care Plan Problem One  Knowledge deficit related to CHF  Role Documenting the Problem One  [place] for Problem One  Active  THN Long Term Goal   In 90 days the patient will verbalize no admission to the hospital.  [place] Term Goal Start Date  06/18/18  Interventions for Problem One Long Term Goal  Reviewed signs and symptoms of heart failure, discussed with caregiver chf action plan, encouraged patient to weigh daily, verified with patient correct dose of fluid pills she should be taking from his last appointment, encouraged medication compliance     Plan: [place] will provide ongoing education for patient on heart failure through phone calls and sending printed information to patient for further discussion.  RN Health Coach will send welcome packet with consent to patient as well as printed information on heart failure.  RN Health Coach will send initial barriers letter, assessment, and care plan to primary care physician.  RN Health Coach will contact patient in the month of September and patient agrees to next outreach.  Lazaro Arms RN, BSN, [place] Direct Dial:  [phone]  Fax: [phone]

## 2018-06-23 DIAGNOSIS — I10 Essential (primary) hypertension: Secondary | ICD-10-CM | POA: Diagnosis not present

## 2018-06-23 DIAGNOSIS — N529 Male erectile dysfunction, unspecified: Secondary | ICD-10-CM | POA: Diagnosis not present

## 2018-06-23 DIAGNOSIS — Z79899 Other long term (current) drug therapy: Secondary | ICD-10-CM | POA: Diagnosis not present

## 2018-06-23 DIAGNOSIS — I509 Heart failure, unspecified: Secondary | ICD-10-CM | POA: Diagnosis not present

## 2018-06-23 DIAGNOSIS — M10042 Idiopathic gout, left hand: Secondary | ICD-10-CM | POA: Diagnosis not present

## 2018-06-23 DIAGNOSIS — Z794 Long term (current) use of insulin: Secondary | ICD-10-CM | POA: Diagnosis not present

## 2018-06-23 DIAGNOSIS — E1065 Type 1 diabetes mellitus with hyperglycemia: Secondary | ICD-10-CM | POA: Diagnosis not present

## 2018-07-16 DIAGNOSIS — Z79899 Other long term (current) drug therapy: Secondary | ICD-10-CM | POA: Diagnosis not present

## 2018-07-16 DIAGNOSIS — E559 Vitamin D deficiency, unspecified: Secondary | ICD-10-CM | POA: Diagnosis not present

## 2018-07-16 DIAGNOSIS — I129 Hypertensive chronic kidney disease with stage 1 through stage 4 chronic kidney disease, or unspecified chronic kidney disease: Secondary | ICD-10-CM | POA: Diagnosis not present

## 2018-07-16 DIAGNOSIS — R809 Proteinuria, unspecified: Secondary | ICD-10-CM | POA: Diagnosis not present

## 2018-07-16 DIAGNOSIS — N183 Chronic kidney disease, stage 3 (moderate): Secondary | ICD-10-CM | POA: Diagnosis not present

## 2018-07-16 DIAGNOSIS — D509 Iron deficiency anemia, unspecified: Secondary | ICD-10-CM | POA: Diagnosis not present

## 2018-07-22 ENCOUNTER — Other Ambulatory Visit: Payer: Self-pay

## 2018-07-22 NOTE — Patient Outreach (Signed)
[facility]) Care Management  07/22/2018  Kelly Key 08-Jul-1971 [id number]    1st outreach to the patient for assessment. No answer. HIPAA compliant voicemail left with contact information.  Plan: RN Health Coach will make another outreach attempt to the patient within thirty days.   Lazaro Arms RN, BSN, [place] Direct Dial:  [phone]  Fax: [phone]

## 2018-08-02 ENCOUNTER — Other Ambulatory Visit: Payer: Self-pay

## 2018-08-02 NOTE — Patient Outreach (Addendum)
[facility] [facility]) Care Management  08/02/2018   Kelly Key [birth year] [id number]  Subjective: Telephone call to the patient for assessment. HIPAA verified by the patient.  The patient states that he is doing well.  The patient gave verbal consent to speak with his caregiver Jolayne Haines (she is on the consent).  Caregiver states that he has not had any chest pain, shortness of breath, swelling or falls.  The patient's weight today was 292 lbs. His blood sugar today was 214.  Caregiver states that he is eating well but some of the fluids that he drinks makes his blood sugar go up.  Caregiver and I discussed fluid intake.  She verbalized understanding.  The patient is taking his medications as prescribed.  He has an appointment with his nephrologist on Wednesday at 9 am.  Caregiver states that his next appointment with Dr Maudie Mercury will be in December.  Current Medications:  Current Outpatient Medications  Medication Sig Dispense Refill  . allopurinol (ZYLOPRIM) 300 MG tablet Take 300 mg by mouth daily.    [name] atorvastatin (LIPITOR) 80 MG tablet Take 80 mg by mouth daily.    . insulin aspart (NOVOLOG) 100 UNIT/ML injection Inject 5 Units into the skin 3 (three) times daily before meals.    . insulin detemir (LEVEMIR) 100 UNIT/ML injection Inject 10 Units into the skin at bedtime.    . Insulin Pen Needle 31G X 5 MM MISC Use as directed. 100 each 0  . linaclotide (LINZESS) 145 MCG CAPS capsule Take 1 capsule (145 mcg total) by mouth daily before breakfast. 30 capsule 0  . sodium bicarbonate 650 MG tablet Take 650 mg by mouth 2 (two) times daily.    [name] amLODipine (NORVASC) 5 MG tablet Take 1 tablet (5 mg total) by mouth daily. 30 tablet 2  . cloNIDine (CATAPRES) 0.3 MG tablet Take 1 tablet (0.3 mg total) by mouth 3 (three) times daily. (Patient taking differently: Take 0.2 mg by mouth 3 (three) times daily. ) 90 tablet 0  . fluticasone (FLONASE) 50 MCG/ACT nasal spray Place 1 spray into both nostrils  daily. 1 g 0  . hydrALAZINE (APRESOLINE) 50 MG tablet Take 50 mg by mouth 3 (three) times daily.    [name] labetalol (NORMODYNE) 200 MG tablet Take 2 tablets (400 mg total) by mouth 3 (three) times daily. (Patient taking differently: Take 300 mg by mouth 2 (two) times daily. ) 180 tablet 3  . losartan (COZAAR) 100 MG tablet Take 1 tablet (100 mg total) by mouth daily. 30 tablet 3  . metolazone (ZAROXOLYN) 5 MG tablet Take 0.5 tablets (2.5 mg total) by mouth as needed (when directed by MD for extra weight gain). (Patient not taking: Reported on 08/02/2018) 15 tablet 2  . torsemide (DEMADEX) 20 MG tablet Take 2 tablets (40 mg total) by mouth daily. 60 tablet 3   No current facility-administered medications for this visit.     Functional Status:  In your present state of health, do you have any difficulty performing the following activities: 06/18/2018 04/26/2018  Hearing? N N  Vision? N N  Difficulty concentrating or making decisions? N N  Walking or climbing stairs? N N  Dressing or bathing? N N  Doing errands, shopping? N N  Preparing Food and eating ? - N  Using the Toilet? - N  In the past six months, have you accidently leaked urine? - N  Do you have problems with loss of bowel control? - N  Managing  your Medications? - N  Managing your Finances? - N  Housekeeping or managing your Housekeeping? - N  Some recent data might be hidden    Fall/Depression Screening: Fall Risk  08/02/2018 06/18/2018 05/12/2018  Falls in the past year? No No No  Risk for fall due to : - - Medication side effect   PHQ 2/9 Scores 06/18/2018 04/16/2018  PHQ - 2 Score 0 0    Assessment: Patient will continue to benefit from health coach outreach for disease management and support.  THN CM Care Plan Problem One     Most Recent Value  THN Long Term Goal   In 90 days the patient will verbalize no admission to the hospital.  [place] Term Goal Start Date  08/02/18  Interventions for Problem One Long Term Goal   Discussed with the caregiver about medications, diet, fluid intake and signs and symptoms of CHF      Plan: [place] will contact patient in the month of December and patient agrees to next outreach.  Lazaro Arms RN, BSN, [place] Direct Dial:  [phone]  Fax: [phone]

## 2018-09-27 DIAGNOSIS — Z125 Encounter for screening for malignant neoplasm of prostate: Secondary | ICD-10-CM | POA: Diagnosis not present

## 2018-09-27 DIAGNOSIS — N529 Male erectile dysfunction, unspecified: Secondary | ICD-10-CM | POA: Diagnosis not present

## 2018-09-27 DIAGNOSIS — E1065 Type 1 diabetes mellitus with hyperglycemia: Secondary | ICD-10-CM | POA: Diagnosis not present

## 2018-09-27 DIAGNOSIS — I129 Hypertensive chronic kidney disease with stage 1 through stage 4 chronic kidney disease, or unspecified chronic kidney disease: Secondary | ICD-10-CM | POA: Diagnosis not present

## 2018-09-27 DIAGNOSIS — N183 Chronic kidney disease, stage 3 (moderate): Secondary | ICD-10-CM | POA: Diagnosis not present

## 2018-09-27 DIAGNOSIS — E785 Hyperlipidemia, unspecified: Secondary | ICD-10-CM | POA: Diagnosis not present

## 2018-09-29 DIAGNOSIS — N183 Chronic kidney disease, stage 3 (moderate): Secondary | ICD-10-CM | POA: Diagnosis not present

## 2018-09-29 DIAGNOSIS — I509 Heart failure, unspecified: Secondary | ICD-10-CM | POA: Diagnosis not present

## 2018-09-29 DIAGNOSIS — E785 Hyperlipidemia, unspecified: Secondary | ICD-10-CM | POA: Diagnosis not present

## 2018-09-29 DIAGNOSIS — R0602 Shortness of breath: Secondary | ICD-10-CM | POA: Diagnosis not present

## 2018-09-29 DIAGNOSIS — Z6841 Body Mass Index (BMI) 40.0 and over, adult: Secondary | ICD-10-CM | POA: Diagnosis not present

## 2018-09-29 DIAGNOSIS — Z79899 Other long term (current) drug therapy: Secondary | ICD-10-CM | POA: Diagnosis not present

## 2018-09-29 DIAGNOSIS — E1122 Type 2 diabetes mellitus with diabetic chronic kidney disease: Secondary | ICD-10-CM | POA: Diagnosis not present

## 2018-09-29 DIAGNOSIS — I10 Essential (primary) hypertension: Secondary | ICD-10-CM | POA: Diagnosis not present

## 2018-09-29 DIAGNOSIS — E1165 Type 2 diabetes mellitus with hyperglycemia: Secondary | ICD-10-CM | POA: Diagnosis not present

## 2018-09-29 DIAGNOSIS — Z0001 Encounter for general adult medical examination with abnormal findings: Secondary | ICD-10-CM | POA: Diagnosis not present

## 2018-09-29 DIAGNOSIS — Z794 Long term (current) use of insulin: Secondary | ICD-10-CM | POA: Diagnosis not present

## 2018-10-07 ENCOUNTER — Other Ambulatory Visit: Payer: Self-pay

## 2018-10-07 DIAGNOSIS — N183 Chronic kidney disease, stage 3 (moderate): Secondary | ICD-10-CM | POA: Diagnosis not present

## 2018-10-07 DIAGNOSIS — Z79810 Long term (current) use of selective estrogen receptor modulators (SERMs): Secondary | ICD-10-CM | POA: Diagnosis not present

## 2018-10-07 DIAGNOSIS — S41111A Laceration without foreign body of right upper arm, initial encounter: Secondary | ICD-10-CM | POA: Diagnosis not present

## 2018-10-07 DIAGNOSIS — I129 Hypertensive chronic kidney disease with stage 1 through stage 4 chronic kidney disease, or unspecified chronic kidney disease: Secondary | ICD-10-CM | POA: Diagnosis not present

## 2018-10-07 DIAGNOSIS — J449 Chronic obstructive pulmonary disease, unspecified: Secondary | ICD-10-CM | POA: Diagnosis not present

## 2018-10-07 DIAGNOSIS — E1122 Type 2 diabetes mellitus with diabetic chronic kidney disease: Secondary | ICD-10-CM | POA: Diagnosis not present

## 2018-10-07 DIAGNOSIS — Z7982 Long term (current) use of aspirin: Secondary | ICD-10-CM | POA: Diagnosis not present

## 2018-10-07 DIAGNOSIS — Z7984 Long term (current) use of oral hypoglycemic drugs: Secondary | ICD-10-CM | POA: Diagnosis not present

## 2018-10-07 DIAGNOSIS — Z9181 History of falling: Secondary | ICD-10-CM | POA: Diagnosis not present

## 2018-10-07 NOTE — Patient Outreach (Signed)
[place] [facility]) Care Management  10/07/2018   SIEGFRIED VIETH August 03, 1971 [id number]  Subjective: Successful outreach to the patient for assessment.  HIPAA verified.  The patient states that he is doing well.  He denies any chest pain , shortness of breath, swelling or falls.  The patient states that his weight today was 300 lbs.  He has been eating more and he said that he has some extra weight but it is not fluid. Discussed with the patient about his food intake concerning  food choices and portion sizes.  Patient verbalized understanding.  He states that hie is taking all of his medications.  He states that he went to an appointment and his blood sugar have come down from 13.0 to 11.0.  He knows that he needs to work on his diet.  He states that he has an appointment with his cardiologist and nephrologist the beginning of next year but could not give me the exact dates.  Current Medications:  Current Outpatient Medications  Medication Sig Dispense Refill  . allopurinol (ZYLOPRIM) 300 MG tablet Take 300 mg by mouth daily.    [name] atorvastatin (LIPITOR) 80 MG tablet Take 80 mg by mouth daily.    . hydrALAZINE (APRESOLINE) 50 MG tablet Take 50 mg by mouth 3 (three) times daily.    . insulin aspart (NOVOLOG) 100 UNIT/ML injection Inject 5 Units into the skin 3 (three) times daily before meals.    . insulin detemir (LEVEMIR) 100 UNIT/ML injection Inject 10 Units into the skin at bedtime.    . Insulin Pen Needle 31G X 5 MM MISC Use as directed. 100 each 0  . linaclotide (LINZESS) 145 MCG CAPS capsule Take 1 capsule (145 mcg total) by mouth daily before breakfast. 30 capsule 0  . sodium bicarbonate 650 MG tablet Take 650 mg by mouth 2 (two) times daily.    [name] amLODipine (NORVASC) 5 MG tablet Take 1 tablet (5 mg total) by mouth daily. 30 tablet 2  . cloNIDine (CATAPRES) 0.3 MG tablet Take 1 tablet (0.3 mg total) by mouth 3 (three) times daily. (Patient taking differently: Take 0.2 mg by mouth 3  (three) times daily. ) 90 tablet 0  . fluticasone (FLONASE) 50 MCG/ACT nasal spray Place 1 spray into both nostrils daily. 1 g 0  . labetalol (NORMODYNE) 200 MG tablet Take 2 tablets (400 mg total) by mouth 3 (three) times daily. (Patient taking differently: Take 300 mg by mouth 2 (two) times daily. ) 180 tablet 3  . losartan (COZAAR) 100 MG tablet Take 1 tablet (100 mg total) by mouth daily. 30 tablet 3  . metolazone (ZAROXOLYN) 5 MG tablet Take 0.5 tablets (2.5 mg total) by mouth as needed (when directed by MD for extra weight gain). (Patient not taking: Reported on 08/02/2018) 15 tablet 2  . torsemide (DEMADEX) 20 MG tablet Take 2 tablets (40 mg total) by mouth daily. 60 tablet 3   No current facility-administered medications for this visit.     Functional Status:  In your present state of health, do you have any difficulty performing the following activities: 06/18/2018 04/26/2018  Hearing? N N  Vision? N N  Difficulty concentrating or making decisions? N N  Walking or climbing stairs? N N  Dressing or bathing? N N  Doing errands, shopping? N N  Preparing Food and eating ? - N  Using the Toilet? - N  In the past six months, have you accidently leaked urine? - N  Do  you have problems with loss of bowel control? - N  Managing your Medications? - N  Managing your Finances? - N  Housekeeping or managing your Housekeeping? - N  Some recent data might be hidden    Fall/Depression Screening: Fall Risk  10/07/2018 08/02/2018 06/18/2018  Falls in the past year? 0 No No  Risk for fall due to : - - -   PHQ 2/9 Scores 06/18/2018 04/16/2018  PHQ - 2 Score 0 0    Assessment: Patient will continue to benefit from health coach outreach for disease management and support. [facility] CM Care Plan Problem One     Most Recent Value  [facility]N Long Term Goal   In 60 days the patient will verbalize that he is watching the carbs in his diet no admission to the hospital.  Interventions for Problem One Long Term Goal   Reviewed with the patient about signs and symptoms of chf, diet and medication adherence.       Plan: RN Health Coach will contact patient in the month of February and patient agrees to next outreach.   Lazaro Arms RN, BSN, [place] Direct Dial:  [phone]  Fax: [phone]

## 2018-11-23 DIAGNOSIS — E559 Vitamin D deficiency, unspecified: Secondary | ICD-10-CM | POA: Diagnosis not present

## 2018-11-23 DIAGNOSIS — Z79899 Other long term (current) drug therapy: Secondary | ICD-10-CM | POA: Diagnosis not present

## 2018-11-23 DIAGNOSIS — I129 Hypertensive chronic kidney disease with stage 1 through stage 4 chronic kidney disease, or unspecified chronic kidney disease: Secondary | ICD-10-CM | POA: Diagnosis not present

## 2018-11-23 DIAGNOSIS — N183 Chronic kidney disease, stage 3 (moderate): Secondary | ICD-10-CM | POA: Diagnosis not present

## 2018-11-23 DIAGNOSIS — D509 Iron deficiency anemia, unspecified: Secondary | ICD-10-CM | POA: Diagnosis not present

## 2018-11-23 DIAGNOSIS — R809 Proteinuria, unspecified: Secondary | ICD-10-CM | POA: Diagnosis not present

## 2018-11-24 DIAGNOSIS — R809 Proteinuria, unspecified: Secondary | ICD-10-CM | POA: Diagnosis not present

## 2018-11-24 DIAGNOSIS — N184 Chronic kidney disease, stage 4 (severe): Secondary | ICD-10-CM | POA: Diagnosis not present

## 2018-11-24 DIAGNOSIS — D649 Anemia, unspecified: Secondary | ICD-10-CM | POA: Diagnosis not present

## 2018-11-24 DIAGNOSIS — N2581 Secondary hyperparathyroidism of renal origin: Secondary | ICD-10-CM | POA: Diagnosis not present

## 2018-11-24 DIAGNOSIS — I5032 Chronic diastolic (congestive) heart failure: Secondary | ICD-10-CM | POA: Diagnosis not present

## 2018-11-24 DIAGNOSIS — E559 Vitamin D deficiency, unspecified: Secondary | ICD-10-CM | POA: Diagnosis not present

## 2018-11-24 DIAGNOSIS — E876 Hypokalemia: Secondary | ICD-10-CM | POA: Diagnosis not present

## 2018-12-08 ENCOUNTER — Other Ambulatory Visit: Payer: Self-pay

## 2018-12-08 DIAGNOSIS — I129 Hypertensive chronic kidney disease with stage 1 through stage 4 chronic kidney disease, or unspecified chronic kidney disease: Secondary | ICD-10-CM | POA: Diagnosis not present

## 2018-12-08 DIAGNOSIS — Z79899 Other long term (current) drug therapy: Secondary | ICD-10-CM | POA: Diagnosis not present

## 2018-12-08 DIAGNOSIS — E559 Vitamin D deficiency, unspecified: Secondary | ICD-10-CM | POA: Diagnosis not present

## 2018-12-08 DIAGNOSIS — D509 Iron deficiency anemia, unspecified: Secondary | ICD-10-CM | POA: Diagnosis not present

## 2018-12-08 DIAGNOSIS — R809 Proteinuria, unspecified: Secondary | ICD-10-CM | POA: Diagnosis not present

## 2018-12-08 DIAGNOSIS — N183 Chronic kidney disease, stage 3 (moderate): Secondary | ICD-10-CM | POA: Diagnosis not present

## 2018-12-08 NOTE — Patient Outreach (Signed)
[place] [facility]) Care Management  12/08/2018   Kelly Key Sep 05, 1971 [id number]  Subjective: Successful outreach to the patient.  HIPAA verified.  Per the patient he is doing " half in half." The patient states that he saw a nephrologist  and his kidneys are not doing well.  He states that he has been having cramps in his side and hands.  He had blood work done yesterday to get more information.  He is waiting to get the results.  He denies any chest pain, shortness of breath or swelling.  His weight today was 280 lbs.  He states that he has gone up in weight.  Has started to change his diet trying to lose weight.  He states that he has started taking potassium, vitamin d 50,000 units.  He is taking his medications as prescribed.  He states that his blood sugar this morning was 120.   Current Medications:  Current Outpatient Medications  Medication Sig Dispense Refill  . allopurinol (ZYLOPRIM) 300 MG tablet Take 300 mg by mouth daily.    [name] atorvastatin (LIPITOR) 80 MG tablet Take 80 mg by mouth daily.    . ergocalciferol (VITAMIN D2) 1.25 MG (50000 UT) capsule Take 50,000 Units by mouth once a week.    . hydrALAZINE (APRESOLINE) 50 MG tablet Take 50 mg by mouth 3 (three) times daily.    . insulin aspart (NOVOLOG) 100 UNIT/ML injection Inject 5 Units into the skin 3 (three) times daily before meals.    . insulin detemir (LEVEMIR) 100 UNIT/ML injection Inject 10 Units into the skin at bedtime.    . Insulin Pen Needle 31G X 5 MM MISC Use as directed. 100 each 0  . linaclotide (LINZESS) 145 MCG CAPS capsule Take 1 capsule (145 mcg total) by mouth daily before breakfast. 30 capsule 0  . potassium chloride SA (K-DUR,KLOR-CON) 20 MEQ tablet Take 20 mEq by mouth daily.    . sodium bicarbonate 650 MG tablet Take 650 mg by mouth 2 (two) times daily.    [name] amLODipine (NORVASC) 5 MG tablet Take 1 tablet (5 mg total) by mouth daily. 30 tablet 2  . cloNIDine (CATAPRES) 0.3 MG tablet Take 1  tablet (0.3 mg total) by mouth 3 (three) times daily. (Patient taking differently: Take 0.2 mg by mouth 3 (three) times daily. ) 90 tablet 0  . fluticasone (FLONASE) 50 MCG/ACT nasal spray Place 1 spray into both nostrils daily. 1 g 0  . labetalol (NORMODYNE) 200 MG tablet Take 2 tablets (400 mg total) by mouth 3 (three) times daily. (Patient taking differently: Take 300 mg by mouth 2 (two) times daily. ) 180 tablet 3  . losartan (COZAAR) 100 MG tablet Take 1 tablet (100 mg total) by mouth daily. 30 tablet 3  . metolazone (ZAROXOLYN) 5 MG tablet Take 0.5 tablets (2.5 mg total) by mouth as needed (when directed by MD for extra weight gain). (Patient not taking: Reported on 08/02/2018) 15 tablet 2  . torsemide (DEMADEX) 20 MG tablet Take 2 tablets (40 mg total) by mouth daily. 60 tablet 3   No current facility-administered medications for this visit.     Functional Status:  In your present state of health, do you have any difficulty performing the following activities: 06/18/2018 04/26/2018  Hearing? N N  Vision? N N  Difficulty concentrating or making decisions? N N  Walking or climbing stairs? N N  Dressing or bathing? N N  Doing errands, shopping? N N  Preparing  Food and eating ? - N  Using the Toilet? - N  In the past six months, have you accidently leaked urine? - N  Do you have problems with loss of bowel control? - N  Managing your Medications? - N  Managing your Finances? - N  Housekeeping or managing your Housekeeping? - N  Some recent data might be hidden    Fall/Depression Screening: Fall Risk  12/08/2018 10/07/2018 08/02/2018  Falls in the past year? 0 0 No  Risk for fall due to : - - -   PHQ 2/9 Scores 06/18/2018 04/16/2018  PHQ - 2 Score 0 0    Assessment: Patient will continue to benefit from health coach outreach for disease management and support. [facility]N CM Care Plan Problem One     Most Recent Value  THN Long Term Goal   In 30 days the patient will verbalize that he is  monitoring his diet no admission to the hospital. for   [place] Term Goal Start Date  12/08/18  Interventions for Problem One Long Term Goal  Discussed diet with the patient, changes in his medication,and signs and symptoms of CHF       Plan: [place] will contact patient in the month of March and patient agrees to next outreach.   Lazaro Arms RN, BSN, [place] Direct Dial:  [phone]  Fax: [phone]

## 2018-12-27 DIAGNOSIS — N183 Chronic kidney disease, stage 3 (moderate): Secondary | ICD-10-CM | POA: Diagnosis not present

## 2018-12-27 DIAGNOSIS — Z79899 Other long term (current) drug therapy: Secondary | ICD-10-CM | POA: Diagnosis not present

## 2018-12-27 DIAGNOSIS — I129 Hypertensive chronic kidney disease with stage 1 through stage 4 chronic kidney disease, or unspecified chronic kidney disease: Secondary | ICD-10-CM | POA: Diagnosis not present

## 2018-12-29 DIAGNOSIS — N2581 Secondary hyperparathyroidism of renal origin: Secondary | ICD-10-CM | POA: Diagnosis not present

## 2018-12-29 DIAGNOSIS — N184 Chronic kidney disease, stage 4 (severe): Secondary | ICD-10-CM | POA: Diagnosis not present

## 2018-12-29 DIAGNOSIS — E559 Vitamin D deficiency, unspecified: Secondary | ICD-10-CM | POA: Diagnosis not present

## 2018-12-29 DIAGNOSIS — N179 Acute kidney failure, unspecified: Secondary | ICD-10-CM | POA: Diagnosis not present

## 2018-12-29 DIAGNOSIS — D649 Anemia, unspecified: Secondary | ICD-10-CM | POA: Diagnosis not present

## 2018-12-29 DIAGNOSIS — I509 Heart failure, unspecified: Secondary | ICD-10-CM | POA: Diagnosis not present

## 2018-12-30 DIAGNOSIS — Z794 Long term (current) use of insulin: Secondary | ICD-10-CM | POA: Diagnosis not present

## 2018-12-30 DIAGNOSIS — N184 Chronic kidney disease, stage 4 (severe): Secondary | ICD-10-CM | POA: Diagnosis not present

## 2018-12-30 DIAGNOSIS — E1165 Type 2 diabetes mellitus with hyperglycemia: Secondary | ICD-10-CM | POA: Diagnosis not present

## 2018-12-30 DIAGNOSIS — E785 Hyperlipidemia, unspecified: Secondary | ICD-10-CM | POA: Diagnosis not present

## 2018-12-30 DIAGNOSIS — Z79899 Other long term (current) drug therapy: Secondary | ICD-10-CM | POA: Diagnosis not present

## 2018-12-30 DIAGNOSIS — I129 Hypertensive chronic kidney disease with stage 1 through stage 4 chronic kidney disease, or unspecified chronic kidney disease: Secondary | ICD-10-CM | POA: Diagnosis not present

## 2018-12-30 DIAGNOSIS — E559 Vitamin D deficiency, unspecified: Secondary | ICD-10-CM | POA: Diagnosis not present

## 2018-12-31 DIAGNOSIS — E1165 Type 2 diabetes mellitus with hyperglycemia: Secondary | ICD-10-CM | POA: Diagnosis not present

## 2018-12-31 DIAGNOSIS — E785 Hyperlipidemia, unspecified: Secondary | ICD-10-CM | POA: Diagnosis not present

## 2018-12-31 DIAGNOSIS — I10 Essential (primary) hypertension: Secondary | ICD-10-CM | POA: Diagnosis not present

## 2019-01-11 ENCOUNTER — Other Ambulatory Visit: Payer: Self-pay

## 2019-01-11 NOTE — Patient Outreach (Signed)
[place] [facility]) Care Management  01/11/2019  Kelly Key 09-17-1971 [id number]    1st unsuccessful outreach to the patient for assessment.  No answer.  HIPAA compliant voicemail left with contact information.  Plan: RN Health Coach will send letter. [place] will make outreach attempt to the patient within a month.  Lazaro Arms RN, BSN, [place] Direct Dial:  [phone]  Fax: [phone]

## 2019-01-12 DIAGNOSIS — I129 Hypertensive chronic kidney disease with stage 1 through stage 4 chronic kidney disease, or unspecified chronic kidney disease: Secondary | ICD-10-CM | POA: Diagnosis not present

## 2019-01-12 DIAGNOSIS — D509 Iron deficiency anemia, unspecified: Secondary | ICD-10-CM | POA: Diagnosis not present

## 2019-01-12 DIAGNOSIS — N183 Chronic kidney disease, stage 3 (moderate): Secondary | ICD-10-CM | POA: Diagnosis not present

## 2019-01-12 DIAGNOSIS — E559 Vitamin D deficiency, unspecified: Secondary | ICD-10-CM | POA: Diagnosis not present

## 2019-01-12 DIAGNOSIS — N189 Chronic kidney disease, unspecified: Secondary | ICD-10-CM | POA: Diagnosis not present

## 2019-01-12 DIAGNOSIS — R809 Proteinuria, unspecified: Secondary | ICD-10-CM | POA: Diagnosis not present

## 2019-01-12 DIAGNOSIS — Z79899 Other long term (current) drug therapy: Secondary | ICD-10-CM | POA: Diagnosis not present

## 2019-01-24 ENCOUNTER — Other Ambulatory Visit: Payer: Self-pay

## 2019-01-24 NOTE — Patient Outreach (Signed)
[place] [facility]) Care Management  01/24/2019   Kelly Key 1971/01/06 [id number]  Subjective: Successful outreach to the patient for assessment. HIPAA verified. The patient states that he is doing fairly well.  He denies any chest pain, shortness of breath, or falls.  He states that he does have some swelling in his ankle.  He states that he was advised to take an extra fluid pill. Advised the patient to elevate his feet when sitting, and observe the salt in his diet.  The patient verbalized understanding.  The patient's weight is 285 lbs. He states that  He is weighing daily. The patient states that his FBS 217.  Encouraged the patient to watch the carbs in his diet.  He verbalized understanding. The patient is taking his medication as prescribed. The patient is very cognizant of his conditions and taking precautions when he leaves his home. He states that he wears a mask, gloves and when he removes his gloves using hand sanitizer.    Current Medications:  Current Outpatient Medications  Medication Sig Dispense Refill  . allopurinol (ZYLOPRIM) 300 MG tablet Take 300 mg by mouth daily.    [name] atorvastatin (LIPITOR) 80 MG tablet Take 80 mg by mouth daily.    . ergocalciferol (VITAMIN D2) 1.25 MG (50000 UT) capsule Take 50,000 Units by mouth once a week.    . hydrALAZINE (APRESOLINE) 50 MG tablet Take 50 mg by mouth 3 (three) times daily.    . insulin aspart (NOVOLOG) 100 UNIT/ML injection Inject 5 Units into the skin 3 (three) times daily before meals.    . insulin detemir (LEVEMIR) 100 UNIT/ML injection Inject 10 Units into the skin at bedtime.    . Insulin Pen Needle 31G X 5 MM MISC Use as directed. 100 each 0  . linaclotide (LINZESS) 145 MCG CAPS capsule Take 1 capsule (145 mcg total) by mouth daily before breakfast. 30 capsule 0  . potassium chloride SA (K-DUR,KLOR-CON) 20 MEQ tablet Take 20 mEq by mouth daily.    . sodium bicarbonate 650 MG tablet Take 650 mg by mouth 2 (two)  times daily.    [name] amLODipine (NORVASC) 5 MG tablet Take 1 tablet (5 mg total) by mouth daily. 30 tablet 2  . cloNIDine (CATAPRES) 0.3 MG tablet Take 1 tablet (0.3 mg total) by mouth 3 (three) times daily. (Patient taking differently: Take 0.2 mg by mouth 3 (three) times daily. ) 90 tablet 0  . fluticasone (FLONASE) 50 MCG/ACT nasal spray Place 1 spray into both nostrils daily. 1 g 0  . labetalol (NORMODYNE) 200 MG tablet Take 2 tablets (400 mg total) by mouth 3 (three) times daily. (Patient taking differently: Take 300 mg by mouth 2 (two) times daily. ) 180 tablet 3  . losartan (COZAAR) 100 MG tablet Take 1 tablet (100 mg total) by mouth daily. 30 tablet 3  . metolazone (ZAROXOLYN) 5 MG tablet Take 0.5 tablets (2.5 mg total) by mouth as needed (when directed by MD for extra weight gain). (Patient not taking: Reported on 08/02/2018) 15 tablet 2  . torsemide (DEMADEX) 20 MG tablet Take 2 tablets (40 mg total) by mouth daily. 60 tablet 3   No current facility-administered medications for this visit.     Functional Status:  In your present state of health, do you have any difficulty performing the following activities: 06/18/2018 04/26/2018  Hearing? N N  Vision? N N  Difficulty concentrating or making decisions? N N  Walking or climbing stairs? N  N  Dressing or bathing? N N  Doing errands, shopping? N N  Preparing Food and eating ? - N  Using the Toilet? - N  In the past six months, have you accidently leaked urine? - N  Do you have problems with loss of bowel control? - N  Managing your Medications? - N  Managing your Finances? - N  Housekeeping or managing your Housekeeping? - N  Some recent data might be hidden    Fall/Depression Screening: Fall Risk  01/24/2019 12/08/2018 10/07/2018  Falls in the past year? 0 0 0  Risk for fall due to : - - -   PHQ 2/9 Scores 06/18/2018 04/16/2018  PHQ - 2 Score 0 0    Assessment: Patient will continue to benefit from health coach outreach for disease  management and support.  THN CM Care Plan Problem One     Most Recent Value  THN Long Term Goal   In 60 days the patient will verbalize that he is monitoring his diet no admission to the hospital. for [place] Term Goal Start Date  01/24/19  Interventions for Problem One Long Term Goal  Discussed with the patient signs and symptoms and red zone of CHF to prevent hospital admissions. explained the improtance of checking his weight daily.        Plan: RN Health Coach will contact patient in the month of May  and patient agrees to next outreach.   Lazaro Arms RN, BSN, [place] Direct Dial:  [phone]  Fax: [phone]

## 2019-01-25 DIAGNOSIS — E785 Hyperlipidemia, unspecified: Secondary | ICD-10-CM | POA: Diagnosis not present

## 2019-01-25 DIAGNOSIS — Z794 Long term (current) use of insulin: Secondary | ICD-10-CM | POA: Diagnosis not present

## 2019-01-25 DIAGNOSIS — E1129 Type 2 diabetes mellitus with other diabetic kidney complication: Secondary | ICD-10-CM | POA: Diagnosis not present

## 2019-01-25 DIAGNOSIS — R809 Proteinuria, unspecified: Secondary | ICD-10-CM | POA: Diagnosis not present

## 2019-01-25 DIAGNOSIS — E1165 Type 2 diabetes mellitus with hyperglycemia: Secondary | ICD-10-CM | POA: Diagnosis not present

## 2019-01-25 DIAGNOSIS — Z79899 Other long term (current) drug therapy: Secondary | ICD-10-CM | POA: Diagnosis not present

## 2019-01-25 DIAGNOSIS — I10 Essential (primary) hypertension: Secondary | ICD-10-CM | POA: Diagnosis not present

## 2019-01-25 DIAGNOSIS — I129 Hypertensive chronic kidney disease with stage 1 through stage 4 chronic kidney disease, or unspecified chronic kidney disease: Secondary | ICD-10-CM | POA: Diagnosis not present

## 2019-01-25 DIAGNOSIS — N184 Chronic kidney disease, stage 4 (severe): Secondary | ICD-10-CM | POA: Diagnosis not present

## 2019-01-25 DIAGNOSIS — Z6841 Body Mass Index (BMI) 40.0 and over, adult: Secondary | ICD-10-CM | POA: Diagnosis not present

## 2019-02-20 ENCOUNTER — Inpatient Hospital Stay (HOSPITAL_COMMUNITY)
Admission: EM | Admit: 2019-02-20 | Discharge: 2019-02-23 | DRG: 553 | Disposition: A | Discharge status: Home Health | Payer: Medicare Other | Attending: Family Medicine | Admitting: Family Medicine

## 2019-02-20 ENCOUNTER — Encounter (HOSPITAL_COMMUNITY): Payer: Self-pay

## 2019-02-20 ENCOUNTER — Other Ambulatory Visit: Payer: Self-pay

## 2019-02-20 ENCOUNTER — Emergency Department (HOSPITAL_COMMUNITY): Payer: Medicare Other

## 2019-02-20 DIAGNOSIS — N189 Chronic kidney disease, unspecified: Secondary | ICD-10-CM | POA: Diagnosis not present

## 2019-02-20 DIAGNOSIS — N184 Chronic kidney disease, stage 4 (severe): Secondary | ICD-10-CM | POA: Diagnosis present

## 2019-02-20 DIAGNOSIS — M11261 Other chondrocalcinosis, right knee: Principal | ICD-10-CM | POA: Diagnosis present

## 2019-02-20 DIAGNOSIS — I5032 Chronic diastolic (congestive) heart failure: Secondary | ICD-10-CM | POA: Diagnosis present

## 2019-02-20 DIAGNOSIS — M109 Gout, unspecified: Secondary | ICD-10-CM | POA: Diagnosis present

## 2019-02-20 DIAGNOSIS — M79661 Pain in right lower leg: Secondary | ICD-10-CM | POA: Diagnosis not present

## 2019-02-20 DIAGNOSIS — J9601 Acute respiratory failure with hypoxia: Secondary | ICD-10-CM | POA: Diagnosis present

## 2019-02-20 DIAGNOSIS — E1165 Type 2 diabetes mellitus with hyperglycemia: Secondary | ICD-10-CM | POA: Diagnosis not present

## 2019-02-20 DIAGNOSIS — Z79899 Other long term (current) drug therapy: Secondary | ICD-10-CM | POA: Diagnosis not present

## 2019-02-20 DIAGNOSIS — Z7951 Long term (current) use of inhaled steroids: Secondary | ICD-10-CM | POA: Diagnosis not present

## 2019-02-20 DIAGNOSIS — I10 Essential (primary) hypertension: Secondary | ICD-10-CM | POA: Diagnosis not present

## 2019-02-20 DIAGNOSIS — Z6841 Body Mass Index (BMI) 40.0 and over, adult: Secondary | ICD-10-CM | POA: Diagnosis not present

## 2019-02-20 DIAGNOSIS — I130 Hypertensive heart and chronic kidney disease with heart failure and stage 1 through stage 4 chronic kidney disease, or unspecified chronic kidney disease: Secondary | ICD-10-CM | POA: Diagnosis present

## 2019-02-20 DIAGNOSIS — M7989 Other specified soft tissue disorders: Secondary | ICD-10-CM | POA: Diagnosis not present

## 2019-02-20 DIAGNOSIS — I82409 Acute embolism and thrombosis of unspecified deep veins of unspecified lower extremity: Secondary | ICD-10-CM

## 2019-02-20 DIAGNOSIS — R0600 Dyspnea, unspecified: Secondary | ICD-10-CM

## 2019-02-20 DIAGNOSIS — N179 Acute kidney failure, unspecified: Secondary | ICD-10-CM

## 2019-02-20 DIAGNOSIS — T380X5A Adverse effect of glucocorticoids and synthetic analogues, initial encounter: Secondary | ICD-10-CM | POA: Diagnosis not present

## 2019-02-20 DIAGNOSIS — I129 Hypertensive chronic kidney disease with stage 1 through stage 4 chronic kidney disease, or unspecified chronic kidney disease: Secondary | ICD-10-CM | POA: Diagnosis not present

## 2019-02-20 DIAGNOSIS — E785 Hyperlipidemia, unspecified: Secondary | ICD-10-CM | POA: Diagnosis present

## 2019-02-20 DIAGNOSIS — N183 Chronic kidney disease, stage 3 (moderate): Secondary | ICD-10-CM | POA: Diagnosis not present

## 2019-02-20 DIAGNOSIS — I749 Embolism and thrombosis of unspecified artery: Secondary | ICD-10-CM | POA: Diagnosis present

## 2019-02-20 DIAGNOSIS — M79662 Pain in left lower leg: Secondary | ICD-10-CM | POA: Diagnosis not present

## 2019-02-20 DIAGNOSIS — R2241 Localized swelling, mass and lump, right lower limb: Secondary | ICD-10-CM | POA: Diagnosis not present

## 2019-02-20 DIAGNOSIS — E669 Obesity, unspecified: Secondary | ICD-10-CM | POA: Diagnosis present

## 2019-02-20 DIAGNOSIS — Z794 Long term (current) use of insulin: Secondary | ICD-10-CM | POA: Diagnosis not present

## 2019-02-20 DIAGNOSIS — E1122 Type 2 diabetes mellitus with diabetic chronic kidney disease: Secondary | ICD-10-CM | POA: Diagnosis present

## 2019-02-20 DIAGNOSIS — E119 Type 2 diabetes mellitus without complications: Secondary | ICD-10-CM

## 2019-02-20 DIAGNOSIS — IMO0002 Reserved for concepts with insufficient information to code with codable children: Secondary | ICD-10-CM

## 2019-02-20 DIAGNOSIS — R000 Tachycardia, unspecified: Secondary | ICD-10-CM | POA: Diagnosis not present

## 2019-02-20 HISTORY — DX: Embolism and thrombosis of unspecified artery: I74.9

## 2019-02-20 LAB — COMPREHENSIVE METABOLIC PANEL
ALT: 16 U/L (ref 0–44)
AST: 15 U/L (ref 15–41)
Albumin: 3.1 g/dL — ABNORMAL LOW (ref 3.5–5.0)
Alkaline Phosphatase: 97 U/L (ref 38–126)
Anion gap: 15 (ref 5–15)
BUN: 45 mg/dL — ABNORMAL HIGH (ref 6–20)
CO2: 23 mmol/L (ref 22–32)
Calcium: 8.4 mg/dL — ABNORMAL LOW (ref 8.9–10.3)
Chloride: 97 mmol/L — ABNORMAL LOW (ref 98–111)
Creatinine, Ser: 3.98 mg/dL — ABNORMAL HIGH (ref 0.61–1.24)
GFR calc Af Amer: 19 mL/min — ABNORMAL LOW (ref >60)
GFR calc non Af Amer: 16 mL/min — ABNORMAL LOW (ref >60)
Glucose, Bld: 180 mg/dL — ABNORMAL HIGH (ref 70–99)
Potassium: 3.5 mmol/L (ref 3.5–5.1)
Sodium: 135 mmol/L (ref 135–145)
Total Bilirubin: 0.2 mg/dL — ABNORMAL LOW (ref 0.3–1.2)
Total Protein: 8 g/dL (ref 6.5–8.1)

## 2019-02-20 LAB — CBC WITH DIFFERENTIAL/PLATELET
Abs Immature Granulocytes: 0.03 10*3/uL (ref 0.00–0.07)
Basophils Absolute: 0.1 10*3/uL (ref 0.0–0.1)
Basophils Relative: 0 %
Eosinophils Absolute: 0.2 10*3/uL (ref 0.0–0.5)
Eosinophils Relative: 1 %
HCT: 35.1 % — ABNORMAL LOW (ref 39.0–52.0)
Hemoglobin: 11.1 g/dL — ABNORMAL LOW (ref 13.0–17.0)
Immature Granulocytes: 0 %
Lymphocytes Relative: 22 %
Lymphs Abs: 2.5 10*3/uL (ref 0.7–4.0)
MCH: 24.8 pg — ABNORMAL LOW (ref 26.0–34.0)
MCHC: 31.6 g/dL (ref 30.0–36.0)
MCV: 78.5 fL — ABNORMAL LOW (ref 80.0–100.0)
Monocytes Absolute: 1.1 10*3/uL — ABNORMAL HIGH (ref 0.1–1.0)
Monocytes Relative: 9 %
Neutro Abs: 7.9 10*3/uL — ABNORMAL HIGH (ref 1.7–7.7)
Neutrophils Relative %: 68 %
Platelets: 350 10*3/uL (ref 150–400)
RBC: 4.47 MIL/uL (ref 4.22–5.81)
RDW: 13.8 % (ref 11.5–15.5)
WBC: 11.7 10*3/uL — ABNORMAL HIGH (ref 4.0–10.5)
nRBC: 0 % (ref 0.0–0.2)

## 2019-02-20 LAB — SYNOVIAL CELL COUNT + DIFF, W/ CRYSTALS
Eosinophils-Synovial: 0 % (ref 0–1)
Lymphocytes-Synovial Fld: 0 % (ref 0–20)
Monocyte-Macrophage-Synovial Fluid: 10 % — ABNORMAL LOW (ref 50–90)
Neutrophil, Synovial: 90 % — ABNORMAL HIGH (ref 0–25)
Other Cells-SYN: 0
WBC, Synovial: 825 /mm3 — ABNORMAL HIGH (ref 0–200)

## 2019-02-20 LAB — GLUCOSE, CAPILLARY
Glucose-Capillary: 134 mg/dL — ABNORMAL HIGH (ref 70–99)
Glucose-Capillary: 236 mg/dL — ABNORMAL HIGH (ref 70–99)
Glucose-Capillary: 292 mg/dL — ABNORMAL HIGH (ref 70–99)

## 2019-02-20 LAB — HEPARIN LEVEL (UNFRACTIONATED)
Heparin Unfractionated: <0.1 IU/mL — ABNORMAL LOW (ref 0.30–0.70)
Heparin Unfractionated: 0.17 IU/mL — ABNORMAL LOW (ref 0.30–0.70)
Heparin Unfractionated: 0.11 IU/mL — ABNORMAL LOW (ref 0.30–0.70)

## 2019-02-20 LAB — GRAM STAIN

## 2019-02-20 LAB — URIC ACID: Uric Acid, Serum: 9.9 mg/dL — ABNORMAL HIGH (ref 3.7–8.6)

## 2019-02-20 LAB — APTT: aPTT: 36 seconds (ref 24–36)

## 2019-02-20 LAB — D-DIMER, QUANTITATIVE: D-Dimer, Quant: 1.63 ug/mL-FEU — ABNORMAL HIGH (ref 0.00–0.50)

## 2019-02-20 LAB — BRAIN NATRIURETIC PEPTIDE: B Natriuretic Peptide: 120 pg/mL — ABNORMAL HIGH (ref 0.0–100.0)

## 2019-02-20 LAB — TROPONIN I: Troponin I: 0.03 ng/mL (ref <0.03)

## 2019-02-20 MED ORDER — ONDANSETRON HCL 4 MG/2ML IJ SOLN: 4.0000 mg | Freq: Four times a day (QID) | INTRAMUSCULAR | Status: DC | PRN

## 2019-02-20 MED ORDER — ACETAMINOPHEN 325 MG PO TABS
650.0000 mg | ORAL_TABLET | Freq: Four times a day (QID) | ORAL | Status: DC | PRN
  Administered 2019-02-21: 650 mg via ORAL
  Filled 2019-02-20: qty 2

## 2019-02-20 MED ORDER — MORPHINE SULFATE (PF) 2 MG/ML IV SOLN
1.0000 mg | INTRAVENOUS | Status: DC | PRN
  Administered 2019-02-20 – 2019-02-23 (×13): 1 mg via INTRAVENOUS
  Filled 2019-02-20 (×13): qty 1

## 2019-02-20 MED ORDER — PREDNISONE 20 MG PO TABS
40.0000 mg | ORAL_TABLET | Freq: Every day | ORAL | Status: DC
  Administered 2019-02-21 – 2019-02-23 (×3): 40 mg via ORAL
  Filled 2019-02-20 (×4): qty 2

## 2019-02-20 MED ORDER — INSULIN ASPART 100 UNIT/ML ~~LOC~~ SOLN: 5.0000 [IU] | Freq: Three times a day (TID) | SUBCUTANEOUS | Status: DC

## 2019-02-20 MED ORDER — AMLODIPINE BESYLATE 5 MG PO TABS
10.0000 mg | ORAL_TABLET | Freq: Every day | ORAL | Status: DC
  Administered 2019-02-21 – 2019-02-22 (×2): 10 mg via ORAL
  Filled 2019-02-20 (×3): qty 2

## 2019-02-20 MED ORDER — FLUTICASONE PROPIONATE 50 MCG/ACT NA SUSP
1.0000 | Freq: Every day | NASAL | Status: DC
  Administered 2019-02-23: 1 via NASAL
  Filled 2019-02-20: qty 16

## 2019-02-20 MED ORDER — ATORVASTATIN CALCIUM 40 MG PO TABS
80.0000 mg | ORAL_TABLET | Freq: Every day | ORAL | Status: DC
  Administered 2019-02-21 – 2019-02-23 (×3): 80 mg via ORAL
  Filled 2019-02-20 (×4): qty 2

## 2019-02-20 MED ORDER — SODIUM CHLORIDE 0.9 % IV SOLN
INTRAVENOUS | Status: DC
  Administered 2019-02-20 – 2019-02-23 (×9): via INTRAVENOUS

## 2019-02-20 MED ORDER — HEPARIN BOLUS VIA INFUSION
4000.0000 [IU] | Freq: Once | INTRAVENOUS | Status: AC
  Administered 2019-02-20: 4000 [IU] via INTRAVENOUS
  Filled 2019-02-20: qty 4000

## 2019-02-20 MED ORDER — PANTOPRAZOLE SODIUM 40 MG PO TBEC
40.0000 mg | DELAYED_RELEASE_TABLET | Freq: Every day | ORAL | Status: DC
  Administered 2019-02-21 – 2019-02-23 (×3): 40 mg via ORAL
  Filled 2019-02-20 (×4): qty 1

## 2019-02-20 MED ORDER — ERGOCALCIFEROL 1.25 MG (50000 UT) PO CAPS
50000.0000 [IU] | ORAL_CAPSULE | ORAL | Status: DC
  Administered 2019-02-22: 50000 [IU] via ORAL
  Filled 2019-02-20: qty 1

## 2019-02-20 MED ORDER — ACETAMINOPHEN 650 MG RE SUPP: 650.0000 mg | Freq: Four times a day (QID) | RECTAL | Status: DC | PRN

## 2019-02-20 MED ORDER — ONDANSETRON HCL 4 MG PO TABS: 4.0000 mg | ORAL_TABLET | Freq: Four times a day (QID) | ORAL | Status: DC | PRN

## 2019-02-20 MED ORDER — EZETIMIBE 10 MG PO TABS
10.0000 mg | ORAL_TABLET | Freq: Every evening | ORAL | Status: DC
  Administered 2019-02-20 – 2019-02-22 (×3): 10 mg via ORAL
  Filled 2019-02-20 (×3): qty 1

## 2019-02-20 MED ORDER — SODIUM BICARBONATE 650 MG PO TABS
650.0000 mg | ORAL_TABLET | Freq: Two times a day (BID) | ORAL | Status: DC
  Administered 2019-02-20 – 2019-02-23 (×6): 650 mg via ORAL
  Filled 2019-02-20 (×7): qty 1

## 2019-02-20 MED ORDER — INSULIN ASPART 100 UNIT/ML ~~LOC~~ SOLN
5.0000 [IU] | Freq: Three times a day (TID) | SUBCUTANEOUS | Status: DC
  Administered 2019-02-20 – 2019-02-22 (×5): 5 [IU] via SUBCUTANEOUS

## 2019-02-20 MED ORDER — CLONIDINE HCL 0.2 MG PO TABS
0.3000 mg | ORAL_TABLET | Freq: Three times a day (TID) | ORAL | Status: DC
  Administered 2019-02-20 – 2019-02-22 (×8): 0.3 mg via ORAL
  Filled 2019-02-20 (×8): qty 1

## 2019-02-20 MED ORDER — LINACLOTIDE 145 MCG PO CAPS
145.0000 ug | ORAL_CAPSULE | Freq: Every day | ORAL | Status: DC
  Administered 2019-02-21 – 2019-02-22 (×2): 145 ug via ORAL
  Filled 2019-02-20 (×3): qty 1

## 2019-02-20 MED ORDER — INSULIN ASPART 100 UNIT/ML ~~LOC~~ SOLN
0.0000 [IU] | Freq: Every day | SUBCUTANEOUS | Status: DC
  Administered 2019-02-20: 3 [IU] via SUBCUTANEOUS
  Administered 2019-02-22: 4 [IU] via SUBCUTANEOUS

## 2019-02-20 MED ORDER — INSULIN DETEMIR 100 UNIT/ML ~~LOC~~ SOLN
10.0000 [IU] | Freq: Two times a day (BID) | SUBCUTANEOUS | Status: DC
  Administered 2019-02-20 – 2019-02-22 (×6): 10 [IU] via SUBCUTANEOUS
  Filled 2019-02-20 (×7): qty 0.1

## 2019-02-20 MED ORDER — LIDOCAINE-EPINEPHRINE (PF) 2 %-1:200000 IJ SOLN
INTRAMUSCULAR | Status: AC
  Administered 2019-02-20: 10 mL
  Filled 2019-02-20: qty 10

## 2019-02-20 MED ORDER — HYDROMORPHONE HCL 1 MG/ML IJ SOLN
1.0000 mg | Freq: Once | INTRAMUSCULAR | Status: AC
  Administered 2019-02-20: 1 mg via INTRAVENOUS
  Filled 2019-02-20: qty 1

## 2019-02-20 MED ORDER — HEPARIN (PORCINE) 25000 UT/250ML-% IV SOLN
2500.0000 [IU]/h | INTRAVENOUS | Status: DC
  Administered 2019-02-20: 1800 [IU]/h via INTRAVENOUS
  Administered 2019-02-20: 1600 [IU]/h via INTRAVENOUS
  Administered 2019-02-21: 2100 [IU]/h via INTRAVENOUS
  Administered 2019-02-21 – 2019-02-22 (×2): 2500 [IU]/h via INTRAVENOUS
  Filled 2019-02-20 (×5): qty 250

## 2019-02-20 MED ORDER — INSULIN ASPART 100 UNIT/ML ~~LOC~~ SOLN
0.0000 [IU] | Freq: Three times a day (TID) | SUBCUTANEOUS | Status: DC
  Administered 2019-02-20: 5 [IU] via SUBCUTANEOUS
  Administered 2019-02-21: 15 [IU] via SUBCUTANEOUS
  Administered 2019-02-21: 5 [IU] via SUBCUTANEOUS
  Administered 2019-02-21: 3 [IU] via SUBCUTANEOUS
  Administered 2019-02-22 (×3): 8 [IU] via SUBCUTANEOUS
  Administered 2019-02-23: 11 [IU] via SUBCUTANEOUS
  Administered 2019-02-23: 8 [IU] via SUBCUTANEOUS

## 2019-02-20 MED ORDER — HEPARIN BOLUS VIA INFUSION
5900.0000 [IU] | Freq: Once | INTRAVENOUS | Status: AC
  Administered 2019-02-20: 5900 [IU] via INTRAVENOUS

## 2019-02-20 MED ORDER — SODIUM CHLORIDE 0.9 % IV SOLN
INTRAVENOUS | Status: DC
  Administered 2019-02-20: 11:00:00 via INTRAVENOUS

## 2019-02-20 NOTE — ED Provider Notes (Signed)
[facility] EMERGENCY DEPARTMENT Provider Note   CSN: [id number] Arrival date & time: 02/20/19  7209    History   Chief Complaint Chief Complaint  Patient presents with   Leg Swelling    HPI Kelly Key is a 48 y.o. male.     HPI  The patient is a very pleasant 48 year old male who unfortunately has a history of diastolic congestive heart failure.  He is known to have an ejection fraction of 50 to 55%.  He is also known to have gout, hypertension but has never had a blood clot and denies taking any blood thinners.  The patient currently takes medications including clonidine, labetalol and is supposed to be taking hydralazine though this was not in his daily medications.  He is an insulin requiring diabetic.  He did not take his morning medicines today.  The patient reports that he has had some leg swelling on the right side for the last couple of days.  He states that this has been persistent, gradually worsening and is now become severe prompting his visit to the ER.  He denies chest pain or shortness of breath, denies fevers or chills, denies injury or recent surgery, denies immobilization.  Denies any history of venous thromboembolism either in his leg or his chest.  I have personally reviewed the medical record, the patient last had an evaluation by the congestive heart failure nurse through the patient out reach [facility] and service, at January 24, 2019 the patient was feeling well, stated that his weight was 285 pounds, his fingersticks were approximately 217, he was doing well at that time.  The patient is known to have poor kidney function and is followed by nephrology, he is taking sodium bicarbonate, potassium supplementation and torsemide.  He has said that there has been discussions about needing to eventually go on to dialysis but he has not yet been to that point yet  Ultrasound was performed on the patient's left upper extremity in June 2019 with no evidence of DVT.  During  that same admission he had an echocardiogram showing an ejection fraction of 60 to 65%, there was some left ventricular diastolic dysfunction.  Past Medical History:  Diagnosis Date   Acute diastolic CHF (congestive heart failure) ([place]) 06/11/2012   EF 50-55% [facility])   Diabetes mellitus    A1c 11.5 (06/11/2012).   Gout    Hepatic steatosis 06/11/2012   Elevated LFTs   Hyperlipemia    Malignant hypertension    Microcytic anemia 06/12/2012   Obesity     Patient Active Problem List   Diagnosis Date Noted   CHF exacerbation ([place]) 03/26/2018   Acute on chronic diastolic CHF (congestive heart failure) ([place]) 03/26/2018   Acute respiratory failure with hypoxia ([place]) 03/26/2018   Diabetes mellitus ([place]) 06/12/2012   Hyponatremia 06/12/2012   Microcytic anemia 47/06/6282   Acute diastolic CHF (congestive heart failure) ([place]) 06/11/2012   HTN (hypertension) 06/11/2012   Elevated LFTs 06/11/2012   Morbid obesity ([place]) 06/11/2012   Hepatic steatosis 06/11/2012    History reviewed. No pertinent surgical history.      Home Medications    Prior to Admission medications   Medication Sig Start Date End Date Taking? Authorizing Provider  allopurinol (ZYLOPRIM) 300 MG tablet Take 300 mg by mouth daily.    [provider]  amLODipine (NORVASC) 5 MG tablet Take 1 tablet (5 mg total) by mouth daily. 04/04/18 05/04/18  Manuella Ghazi, Pratik D, DO  atorvastatin (LIPITOR) 80 MG  tablet Take 80 mg by mouth daily.    [provider]  cloNIDine (CATAPRES) 0.3 MG tablet Take 1 tablet (0.3 mg total) by mouth 3 (three) times daily. Patient taking differently: Take 0.2 mg by mouth 3 (three) times daily.  04/04/18 05/04/18  Manuella Ghazi, Pratik D, DO  ergocalciferol (VITAMIN D2) 1.25 MG (50000 UT) capsule Take 50,000 Units by mouth once a week.    [provider]  fluticasone (FLONASE) 50 MCG/ACT nasal spray Place 1 spray into both nostrils daily. 04/04/18 05/04/18  Manuella Ghazi, Pratik  D, DO  hydrALAZINE (APRESOLINE) 50 MG tablet Take 50 mg by mouth 3 (three) times daily.    [provider]  insulin aspart (NOVOLOG) 100 UNIT/ML injection Inject 5 Units into the skin 3 (three) times daily before meals.    [provider]  insulin detemir (LEVEMIR) 100 UNIT/ML injection Inject 10 Units into the skin at bedtime.    [provider]  Insulin Pen Needle 31G X 5 MM MISC Use as directed. 04/04/18   Manuella Ghazi, Pratik D, DO  labetalol (NORMODYNE) 200 MG tablet Take 2 tablets (400 mg total) by mouth 3 (three) times daily. Patient taking differently: Take 300 mg by mouth 2 (two) times daily.  04/04/18 05/04/18  Manuella Ghazi, Pratik D, DO  linaclotide Rolan Lipa) 145 MCG CAPS capsule Take 1 capsule (145 mcg total) by mouth daily before breakfast. 04/05/18   Manuella Ghazi, Pratik D, DO  losartan (COZAAR) 100 MG tablet Take 1 tablet (100 mg total) by mouth daily. 04/04/18 05/04/18  Manuella Ghazi, Pratik D, DO  metolazone (ZAROXOLYN) 5 MG tablet Take 0.5 tablets (2.5 mg total) by mouth as needed (when directed by MD for extra weight gain). Patient not taking: Reported on 08/02/2018 05/21/18 08/19/18  Bensimhon, Shaune Pascal, MD  potassium chloride SA (K-DUR,KLOR-CON) 20 MEQ tablet Take 20 mEq by mouth daily.    [provider]  sodium bicarbonate 650 MG tablet Take 650 mg by mouth 2 (two) times daily.    [provider]  torsemide (DEMADEX) 20 MG tablet Take 2 tablets (40 mg total) by mouth daily. 04/04/18 05/04/18  Heath Lark D, DO    Family History No family history on file.  Social History Social History   Tobacco Use   Smoking status: Never Smoker   Smokeless tobacco: Never Used  Substance Use Topics   Alcohol use: No   Drug use: No     Allergies   Lisinopril   Review of Systems Review of Systems  All other systems reviewed and are negative.    Physical Exam Updated Vital Signs BP (!) 191/113 (BP Location: Left Arm)    Pulse (!) 112    Temp 98.7 F (37.1 C)    Resp (!)  30    Ht 1.702 m (5\' 7" )    Wt 133.8 kg    SpO2 100%    BMI 46.20 kg/m   Physical Exam Vitals signs and nursing note reviewed.  Constitutional:      General: He is not in acute distress.    Appearance: He is well-developed.  HENT:     Head: Normocephalic and atraumatic.     Nose: No congestion or rhinorrhea.     Mouth/Throat:     Pharynx: No oropharyngeal exudate.  Eyes:     General: No scleral icterus.       Right eye: No discharge.        Left eye: No discharge.     Conjunctiva/sclera: Conjunctivae normal.  Pupils: Pupils are equal, round, and reactive to light.  Neck:     Musculoskeletal: Normal range of motion and neck supple.     Thyroid: No thyromegaly.     Vascular: No JVD.  Cardiovascular:     Rate and Rhythm: Regular rhythm. Tachycardia present.     Heart sounds: Normal heart sounds. No murmur. No friction rub. No gallop.      Comments: Sinus tachycardia at approximately 110 bpm, normal pulses, no obvious JVD (the patient has a very large neck) Pulmonary:     Effort: Respiratory distress present.     Breath sounds: Normal breath sounds. No wheezing or rales.     Comments: The patient is tachypneic to 24 breaths/min, his lung sounds are clear, he speaks in slightly shortened sentences Abdominal:     General: Bowel sounds are normal. There is no distension.     Palpations: Abdomen is soft. There is no mass.     Tenderness: There is no abdominal tenderness.  Musculoskeletal: Normal range of motion.        General: Swelling and tenderness present. No deformity or signs of injury.     Right lower leg: Edema present.     Left lower leg: No edema.     Comments: The patient has what appears to be an effusion of the right knee, he has diffuse edema from the knee through the foot on the right side, he does have some 1+ edema on the left side through the foot.  The edema on the right is 3+.  He has no tenderness in the bilateral thighs, they are symmetrical.  Lymphadenopathy:      Cervical: No cervical adenopathy.  Skin:    General: Skin is warm and dry.     Findings: No erythema or rash.  Neurological:     General: No focal deficit present.     Mental Status: He is alert and oriented to person, place, and time. Mental status is at baseline.     Motor: No weakness.     Coordination: Coordination normal.  Psychiatric:        Behavior: Behavior normal.      ED Treatments / Results  Labs (all labs ordered are listed, but only abnormal results are displayed) Labs Reviewed  BRAIN NATRIURETIC PEPTIDE  TROPONIN I  CBC WITH DIFFERENTIAL/PLATELET    EKG None  Radiology No results found.  Procedures .Joint Aspiration/Arthrocentesis Date/Time: 02/20/2019 9:42 AM Performed by: Noemi Chapel, MD Authorized by: Noemi Chapel, MD   Consent:    Consent obtained:  Written and verbal   Consent given by:  Patient   Risks discussed:  Bleeding, infection, pain, incomplete drainage and nerve damage   Alternatives discussed:  No treatment Location:    Location:  Knee Anesthesia (see MAR for exact dosages):    Anesthesia method:  Local infiltration   Local anesthetic:  Lidocaine 1% WITH epi Procedure details:    Preparation: Patient was prepped and draped in usual sterile fashion     Needle gauge:  18 G   Ultrasound guidance: no     Approach:  Lateral   Aspirate amount:  70   Aspirate characteristics:  Yellow   Steroid injected: no     Specimen collected: yes   Post-procedure details:    Dressing:  Adhesive bandage and sterile dressing   Patient tolerance of procedure:  Tolerated well, no immediate complications Comments:         .Critical Care Performed by: Noemi Chapel,  MD Authorized by: Noemi Chapel, MD   Critical care provider statement:    Critical care time (minutes):  35   Critical care time was exclusive of:  Separately billable procedures and treating other patients and teaching time   Critical care was necessary to treat or prevent  imminent or life-threatening deterioration of the following conditions: PE treatment.   Critical care was time spent personally by me on the following activities:  Blood draw for specimens, development of treatment plan with patient or surrogate, discussions with consultants, evaluation of patient's response to treatment, examination of patient, obtaining history from patient or surrogate, ordering and performing treatments and interventions, ordering and review of laboratory studies, ordering and review of radiographic studies, pulse oximetry, re-evaluation of patient's condition and review of old charts Comments:          (including critical care time)  Medications Ordered in ED Medications  HYDROmorphone (DILAUDID) injection 1 mg (has no administration in time range)     Initial Impression / Assessment and Plan / ED Course  I have reviewed the triage vital signs and the nursing notes.  Pertinent labs & imaging results that were available during my care of the patient were reviewed by me and considered in my medical decision making (see chart for details).        The patient has good capillary refill and pulses suggesting that this is not an arterial injury.  I am concerned about DVT in the right lower extremity though this could also be asymmetric edema from worsening congestive heart failure.  The patient does have known renal disease, we do not know his baseline kidney function since he was last seen at his nephrologist but the review of the medical record shows his most recent creatinine here was 2.3 creatinine with a BUN of 30 and a GFR of 37.  That was in June 2019.  The patient will need a d-dimer, chest x-ray, BNP, pain medication, I will do a bedside ultrasound to see if there is compressibility.  The patient does look edematous.  He may need a joint arthrocentesis to look for other signs of knee pain including acute gouty arthritis.  #1 -Tachypnea shortness of breath and mild  tachycardia.  I am concerned that the patient may have a pulmonary embolism.  He has asymmetric legs with unilateral right-sided edema, I did do a compressibility ultrasound at the bedside which showed no obstructive clot according to my limited evaluation due to the patient's edema, he will need a formal study and because of his tachypnea and unilateral leg swelling the decision was made to add heparin in case this was a pulmonary embolism.  #2 -acute on chronic renal failure.  His kidneys are poorly functioning today, his GFR is less than 20, this is much worse than his baseline creatinine.  Review of the chart shows that creatinine is usually 2.3 and today it is 3.98.  #3 -swollen right knee, effusion was tapped successfully, sample sent to lab.  This will be followed for gout versus inflammatory versus infection.  The patient has a minimal leukocytosis.  I discussed care with the hospitalist who will admit the patient in the hospital.  Heparin drip has been started.  Final Clinical Impressions(s) / ED Diagnoses   Final diagnoses:  Acute renal failure superimposed on chronic kidney disease, unspecified CKD stage, unspecified acute renal failure type ([place])  Leg swelling      Noemi Chapel, MD 02/20/19 1047

## 2019-02-20 NOTE — ED Notes (Signed)
ED Provider at bedside. 

## 2019-02-20 NOTE — ED Notes (Signed)
Pt to take scheduled home meds per EDP.

## 2019-02-20 NOTE — H&P (Addendum)
History and Physical    Kelly Key HCW:[id number] DOB: 10-09-1971 DOA: 02/20/2019  PCP: [name], MD   Patient coming from: Home  Chief Complaint: Right lower extremity swelling and pain  HPI: Kelly Key is a 48 y.o. male with medical history significant for chronic diastolic CHF with LVEF 60 to 65% in 03/2018, morbid obesity, diabetes, gout, dyslipidemia, and CKD stage IV who presented to the ED with right lower extremity swelling and pain that began approximately 2 days ago that has been gradually worsening.  He denies any prior history of blood clots and denies any chest pain or shortness of breath.  He denies fevers, or chills, but does appear to have some inspiratory pleuritic chest pain on the left side.  He denies any specific aggravating or alleviating factors and states that he has been trying to lose weight, but quickly gains it back and has been cycling with his weights a lot recently.  He sees nephrology in the outpatient setting and typically has baseline creatinine 2.93 and is noted to have CKD stage IV.  He has been recommended to initiate dialysis soon, but is reluctant to do so.   ED Course: Vital signs initially demonstrated significant hypertension which has now improved.  He was noted to have severe right lower extremity swelling and pain particularly to his knee on the right side for which the joint was tapped and sample sent to lab.  There was concern for pulmonary embolism and DVT, but unfortunately CT angiogram could not be performed given patient's renal function and therefore, he was started on IV heparin drip.  He was noted to be somewhat hypoxemic for which she has been placed on 2 L nasal cannula.  Mild d-dimer elevation noted, chest x-ray with no acute abnormalities and BNP within reasonable limits.  Review of Systems: All others reviewed and otherwise negative.  Past Medical History:  Diagnosis Date  . Acute diastolic CHF (congestive heart failure) ([place]) 06/11/2012    EF 50-55% [facility])  . Diabetes mellitus    A1c 11.5 (06/11/2012).  . Gout   . Hepatic steatosis 06/11/2012   Elevated LFTs  . Hyperlipemia   . Malignant hypertension   . Microcytic anemia 06/12/2012  . Obesity     History reviewed. No pertinent surgical history.   reports that he has never smoked. He has never used smokeless tobacco. He reports that he does not drink alcohol or use drugs.  Allergies  Allergen Reactions  . Hydralazine   . Lisinopril Swelling    History reviewed. No pertinent family history.  Prior to Admission medications   Medication Sig Start Date End Date Taking? Authorizing Provider  amLODipine (NORVASC) 10 MG tablet Take 10 mg by mouth daily.   Yes [provider]  atorvastatin (LIPITOR) 80 MG tablet Take 80 mg by mouth daily.   Yes [provider]  cloNIDine (CATAPRES) 0.3 MG tablet Take 1 tablet (0.3 mg total) by mouth 3 (three) times daily. 04/04/18 02/20/19 Yes Adrinne Sze D, DO  ergocalciferol (VITAMIN D2) 1.25 MG (50000 UT) capsule Take 50,000 Units by mouth once a week.   Yes [provider]  ezetimibe (ZETIA) 10 MG tablet Take 10 mg by mouth every evening. 01/20/19  Yes [provider]  fluticasone (FLONASE) 50 MCG/ACT nasal spray Place 1 spray into both nostrils daily. 04/04/18 02/20/19 Yes Arantza Darrington D, DO  insulin aspart (NOVOLOG) 100 UNIT/ML injection Inject 5-15 Units into the skin 3 (three) times daily before  meals.    Yes [provider]  insulin detemir (LEVEMIR) 100 UNIT/ML injection Inject 15-45 Units into the skin 2 (two) times daily. Patient takes `5 units in the morning and 45 units in the evening   Yes [provider]  Insulin Pen Needle 31G X 5 MM MISC Use as directed. 04/04/18  Yes Manuella Ghazi, Ngoc Daughtridge D, DO  linaclotide Rolan Lipa) 145 MCG CAPS capsule Take 1 capsule (145 mcg total) by mouth daily before breakfast. 04/05/18  Yes Manuella Ghazi, Ryer Asato D, DO  losartan (COZAAR) 100 MG tablet Take 1  tablet (100 mg total) by mouth daily. 04/04/18 02/20/19 Yes Kinzi Frediani D, DO  potassium chloride SA (K-DUR,KLOR-CON) 20 MEQ tablet Take 20 mEq by mouth daily.   Yes [provider]  sodium bicarbonate 650 MG tablet Take 650 mg by mouth 2 (two) times daily.   Yes [provider]  torsemide (DEMADEX) 20 MG tablet Take 20 mg by mouth 3 (three) times daily.   Yes [provider]    Physical Exam: Vitals:   02/20/19 1100 02/20/19 1115 02/20/19 1200 02/20/19 1202  BP: (!) 141/92   (!) 157/93  Pulse:  82  88  Resp: 17 20 20  (!) 22  Temp:    99 F (37.2 C)  TempSrc:    Oral  SpO2:  100%  100%  Weight:   (!) 142 kg   Height:   5\' 7"  (1.702 m)     Constitutional: NAD, calm, comfortable, obese Vitals:   02/20/19 1100 02/20/19 1115 02/20/19 1200 02/20/19 1202  BP: (!) 141/92   (!) 157/93  Pulse:  82  88  Resp: 17 20 20  (!) 22  Temp:    99 F (37.2 C)  TempSrc:    Oral  SpO2:  100%  100%  Weight:   (!) 142 kg   Height:   5\' 7"  (1.702 m)    Eyes: lids and conjunctivae normal ENMT: Mucous membranes are moist.  Neck: normal, supple Respiratory: clear to auscultation bilaterally. Normal respiratory effort. No accessory muscle use.  Currently on 2 L nasal cannula. Cardiovascular: Regular rate and rhythm, no murmurs.  Right lower extremity edema noted. Abdomen: no tenderness, no distention. Bowel sounds positive.  Musculoskeletal: Right lower extremity swelling with tenderness noted. Skin: no rashes, lesions, ulcers.  Warmth to skin noted on right side. Psychiatric: Normal judgment and insight. Alert and oriented x 3. Normal mood.   Labs on Admission: I have personally reviewed following labs and imaging studies  CBC: Recent Labs  Lab 02/20/19 0900  WBC 11.7*  NEUTROABS 7.9*  HGB 11.1*  HCT 35.1*  MCV 78.5*  PLT 967   Basic Metabolic Panel: Recent Labs  Lab 02/20/19 0900  NA 135  K 3.5  CL 97*  CO2 23  GLUCOSE 180*  BUN 45*  CREATININE 3.98*   CALCIUM 8.4*   GFR: Estimated Creatinine Clearance: 30.3 mL/min (A) (by C-G formula based on SCr of 3.98 mg/dL (H)). Liver Function Tests: Recent Labs  Lab 02/20/19 0900  AST 15  ALT 16  ALKPHOS 97  BILITOT 0.2*  PROT 8.0  ALBUMIN 3.1*   No results for input(s): LIPASE, AMYLASE in the last 168 hours. No results for input(s): AMMONIA in the last 168 hours. Coagulation Profile: No results for input(s): INR, PROTIME in the last 168 hours. Cardiac Enzymes: Recent Labs  Lab 02/20/19 0900  TROPONINI 0.03*   BNP (last 3 results) No results for input(s): PROBNP in the last 8760  hours. HbA1C: No results for input(s): HGBA1C in the last 72 hours. CBG: No results for input(s): GLUCAP in the last 168 hours. Lipid Profile: No results for input(s): CHOL, HDL, LDLCALC, TRIG, CHOLHDL, LDLDIRECT in the last 72 hours. Thyroid Function Tests: No results for input(s): TSH, T4TOTAL, FREET4, T3FREE, THYROIDAB in the last 72 hours. Anemia Panel: No results for input(s): VITAMINB12, FOLATE, FERRITIN, TIBC, IRON, RETICCTPCT in the last 72 hours. Urine analysis:    Component Value Date/Time   COLORURINE YELLOW 06/11/2012 0518   APPEARANCEUR CLEAR 06/11/2012 0518   LABSPEC >1.030 (H) 06/11/2012 0518   PHURINE 5.5 06/11/2012 0518   GLUCOSEU NEGATIVE 06/11/2012 0518   HGBUR NEGATIVE 06/11/2012 0518   BILIRUBINUR NEGATIVE 06/11/2012 0518   KETONESUR NEGATIVE 06/11/2012 0518   PROTEINUR 100 (A) 06/11/2012 0518   UROBILINOGEN 0.2 06/11/2012 0518   NITRITE NEGATIVE 06/11/2012 0518   LEUKOCYTESUR NEGATIVE 06/11/2012 0518    Radiological Exams on Admission: Dg Chest Port 1 View  Result Date: 02/20/2019 CLINICAL DATA:  48 year old male with history of pain and swelling in both legs. EXAM: PORTABLE CHEST 1 VIEW COMPARISON:  No priors. FINDINGS: Lung volumes are normal. No consolidative airspace disease. No pleural effusions. No pneumothorax. No pulmonary nodule or mass noted. Pulmonary  vasculature and the cardiomediastinal silhouette are within normal limits. IMPRESSION: No radiographic evidence of acute cardiopulmonary disease. Electronically Signed   By: Vinnie Langton M.D.   On: 02/20/2019 09:24    Assessment/Plan Principal Problem:   Acute respiratory failure with hypoxia (HCC) Active Problems:   HTN (hypertension)   Morbid obesity ([place])   Diabetes mellitus ([place])   Thromboembolism ([place])   AKI (acute kidney injury) ([place])    1. Acute hypoxemic respiratory failure with mild tachypnea and tachycardia in the setting of right lower extremity edema.  Concern for DVT/PE noted and patient started on heparin drip.  Continue on current measures and anticipate Doppler evaluation of lower extremity along with eventual CT angiogram and/or VQ scan with 2D echocardiogram depending on AKI. 2. AKI on CKD stage IV.  Will maintain on IV fluid for now and assess response.  Baseline creatinine noted to be 2.93 with GFR of 28 on recent nephrology visit on 12/2018.  Will recheck labs in a.m. and monitor strict I's and O's as well as daily weights and consider nephrology consultation should there not be much improvement noted.  No urgent need for dialysis currently.  With hold nephrotoxic agents and diuretics at this time. 3. Swollen right knee with signs of pseudogout.  Status post joint tap in ED.  Will start prednisone 40 mg daily and add morphine as needed for breakthrough pain.  Check serum uric acid levels as well. 4. Type 2 diabetes.  Patient is insulin-dependent.  Will maintain on mealtime and long-acting insulin at half the usual dose and keep on carb modified diet with SSI to be added on. 5. Essential hypertension.  Currently controlled.  Maintain on home amlodipine and clonidine and will add on hydralazine for significant elevations.  Hold losartan and torsemide for now. 6. Morbid obesity.  Will need to follow-up outpatient for weight management.   DVT prophylaxis: Heparin gtt Code  Status: Full code Family Communication: None at bedside Disposition Plan: Continue treatment for presumptive DVT/PE as well as AKI Consults called: None Admission status: Inpatient, telemetry   [facility] Pager [phone]  If 7PM-7AM, please contact night-coverage [email or web address] Password [email or web address]  02/20/2019, 1:22 PM

## 2019-02-20 NOTE — ED Triage Notes (Signed)
Pt c/o pain and swelling in both legs but r worse than left.  Denies any cough, fever, or sob.

## 2019-02-20 NOTE — ED Notes (Signed)
Pt's oxygen drops when resting, as low as 83%. Pt reports this happens from time to time. Pt placed on 2L.

## 2019-02-20 NOTE — Progress Notes (Addendum)
ANTICOAGULATION CONSULT NOTE - Initial Consult  Pharmacy Consult for heparin dosing Indication: VTE treatment  Allergies  Allergen Reactions  . Lisinopril Swelling    Patient Measurements: Height: 5\' 7"  (170.2 cm) Weight: 295 lb (133.8 kg) IBW/kg (Calculated) : 66.1 Heparin Dosing Weight: HEPARIN DW (KG): 98  Vital Signs: Temp: 98.7 F (37.1 C) (04/26 0855) BP: 194/108 (04/26 0900) Pulse Rate: 107 (04/26 0900)  Labs: Recent Labs    02/20/19 0900  HGB 11.1*  HCT 35.1*  PLT 350  CREATININE 3.98*  TROPONINI 0.03*    Estimated Creatinine Clearance: 29.3 mL/min (A) (by C-G formula based on SCr of 3.98 mg/dL (H)).   Medical History: Past Medical History:  Diagnosis Date  . Acute diastolic CHF (congestive heart failure) ([place]) 06/11/2012   EF 50-55% [facility])  . Diabetes mellitus    A1c 11.5 (06/11/2012).  . Gout   . Hepatic steatosis 06/11/2012   Elevated LFTs  . Hyperlipemia   . Malignant hypertension   . Microcytic anemia 06/12/2012  . Obesity       Assessment: Pharmacy consulted to dose heparin for this 48 yo male with acute VTE and D-DImer elevation of 1.63 ug/mL-FEU.  He hasn't been on any   anti-coagulants prior to admission.  CBC is WNL at this time.  02/20/19 1600 PM UPDATE Heparin level= 0.17 IU/mL   Goal of Therapy:  Heparin level 0.3-0.7 units/ml Monitor platelets by anticoagulation protocol: Yes   Plan:  Give another heparin bolus of 4000 units now Increase heparin infusion to 1800 units/hr Check anti-Xa level in 6-8 hours and daily while on heparin Continue to monitor H&H and platelets  Despina Pole 02/20/2019,10:35 AM

## 2019-02-20 NOTE — ED Notes (Signed)
Date and time results received: 02/20/19 0940  Test: Troponin Critical Value: 0.03  Name of Provider Notified: Dr. Sabra Heck  Orders Received? Or Actions Taken?: See chart

## 2019-02-21 ENCOUNTER — Inpatient Hospital Stay (HOSPITAL_COMMUNITY): Payer: Medicare Other

## 2019-02-21 LAB — BASIC METABOLIC PANEL
Anion gap: 11 (ref 5–15)
BUN: 44 mg/dL — ABNORMAL HIGH (ref 6–20)
CO2: 22 mmol/L (ref 22–32)
Calcium: 7.8 mg/dL — ABNORMAL LOW (ref 8.9–10.3)
Chloride: 100 mmol/L (ref 98–111)
Creatinine, Ser: 3.59 mg/dL — ABNORMAL HIGH (ref 0.61–1.24)
GFR calc Af Amer: 22 mL/min — ABNORMAL LOW (ref >60)
GFR calc non Af Amer: 19 mL/min — ABNORMAL LOW (ref >60)
Glucose, Bld: 220 mg/dL — ABNORMAL HIGH (ref 70–99)
Potassium: 3.8 mmol/L (ref 3.5–5.1)
Sodium: 133 mmol/L — ABNORMAL LOW (ref 135–145)

## 2019-02-21 LAB — CBC
HCT: 32.4 % — ABNORMAL LOW (ref 39.0–52.0)
Hemoglobin: 10.1 g/dL — ABNORMAL LOW (ref 13.0–17.0)
MCH: 24.6 pg — ABNORMAL LOW (ref 26.0–34.0)
MCHC: 31.2 g/dL (ref 30.0–36.0)
MCV: 78.8 fL — ABNORMAL LOW (ref 80.0–100.0)
Platelets: 333 10*3/uL (ref 150–400)
RBC: 4.11 MIL/uL — ABNORMAL LOW (ref 4.22–5.81)
RDW: 13.7 % (ref 11.5–15.5)
WBC: 10.4 10*3/uL (ref 4.0–10.5)
nRBC: 0 % (ref 0.0–0.2)

## 2019-02-21 LAB — HEPARIN LEVEL (UNFRACTIONATED)
Heparin Unfractionated: 0.13 IU/mL — ABNORMAL LOW (ref 0.30–0.70)
Heparin Unfractionated: 0.35 IU/mL (ref 0.30–0.70)

## 2019-02-21 LAB — PROTEIN, BODY FLUID (OTHER): Total Protein, Body Fluid Other: 3.9 g/dL

## 2019-02-21 LAB — PROCALCITONIN: Procalcitonin: 0.39 ng/mL

## 2019-02-21 LAB — GLUCOSE, CAPILLARY
Glucose-Capillary: 189 mg/dL — ABNORMAL HIGH (ref 70–99)
Glucose-Capillary: 227 mg/dL — ABNORMAL HIGH (ref 70–99)
Glucose-Capillary: 367 mg/dL — ABNORMAL HIGH (ref 70–99)
Glucose-Capillary: 457 mg/dL — ABNORMAL HIGH (ref 70–99)

## 2019-02-21 LAB — GLUCOSE, RANDOM: Glucose, Bld: 456 mg/dL — ABNORMAL HIGH (ref 70–99)

## 2019-02-21 LAB — GLUCOSE, BODY FLUID OTHER: Glucose, Body Fluid Other: 150 mg/dL

## 2019-02-21 MED ORDER — TORSEMIDE 20 MG PO TABS
20.0000 mg | ORAL_TABLET | Freq: Three times a day (TID) | ORAL | Status: DC
  Administered 2019-02-21 – 2019-02-23 (×7): 20 mg via ORAL
  Filled 2019-02-21 (×7): qty 1

## 2019-02-21 MED ORDER — HEPARIN BOLUS VIA INFUSION
3000.0000 [IU] | Freq: Once | INTRAVENOUS | Status: AC
  Administered 2019-02-21: 3000 [IU] via INTRAVENOUS
  Filled 2019-02-21: qty 3000

## 2019-02-21 MED ORDER — INSULIN ASPART 100 UNIT/ML ~~LOC~~ SOLN
7.0000 [IU] | Freq: Once | SUBCUTANEOUS | Status: AC
  Administered 2019-02-21: 7 [IU] via SUBCUTANEOUS

## 2019-02-21 MED ORDER — GABAPENTIN 100 MG PO CAPS
100.0000 mg | ORAL_CAPSULE | Freq: Three times a day (TID) | ORAL | Status: DC
  Administered 2019-02-21 – 2019-02-23 (×8): 100 mg via ORAL
  Filled 2019-02-21 (×8): qty 1

## 2019-02-21 NOTE — Progress Notes (Signed)
PROGRESS NOTE    Kelly Key  WLN:[id number] DOB: 22-Jul-1971 DOA: 02/20/2019 PCP: [name], MD   Brief Narrative:  Per HPI:  Kelly Key is a 48 y.o. male with medical history significant for chronic diastolic CHF with LVEF 60 to 65% in 03/2018, morbid obesity, diabetes, gout, dyslipidemia, and CKD stage IV who presented to the ED with right lower extremity swelling and pain that began approximately 2 days ago that has been gradually worsening.  He denies any prior history of blood clots and denies any chest pain or shortness of breath.  He denies fevers, or chills, but does appear to have some inspiratory pleuritic chest pain on the left side.  He denies any specific aggravating or alleviating factors and states that he has been trying to lose weight, but quickly gains it back and has been cycling with his weights a lot recently.  He sees nephrology in the outpatient setting and typically has baseline creatinine 2.93 and is noted to have CKD stage IV.  He has been recommended to initiate dialysis soon, but is reluctant to do so.  Assessment & Plan:   Principal Problem:   Acute respiratory failure with hypoxia (HCC) Active Problems:   HTN (hypertension)   Morbid obesity ([place])   Diabetes mellitus ([place])   Thromboembolism ([place])   AKI (acute kidney injury) ([place])   1. Acute hypoxemic respiratory failure with mild tachypnea and tachycardia in the setting of right lower extremity edema.  Concern for DVT/PE noted and patient started on heparin drip.  Continue on current measures and anticipate Doppler evaluation of lower extremity along with eventual CT angiogram and/or VQ scan with 2D echocardiogram depending on AKI. 2. AKI on CKD stage IV.  Will maintain on IV fluid for now and assess response.  Baseline creatinine noted to be 2.93 with GFR of 28 on recent nephrology visit on 12/2018.  Will recheck labs in a.m. and monitor strict I's and O's as well as daily weights and consider nephrology  consultation should there not be much improvement noted.  No urgent need for dialysis currently.  With hold nephrotoxic agents at this time, but will resume home torsemide. 3. Swollen right knee with signs of pseudogout.  Status post joint tap in ED.  Will start prednisone 40 mg daily and add morphine as needed for breakthrough pain.    Uric acid level elevated at 9.9.  We will also order gabapentin today.  Check procalcitonin today. 4. Type 2 diabetes.  Patient is insulin-dependent.  Will maintain on mealtime and long-acting insulin at half the usual dose and keep on carb modified diet with SSI to be added on. 5. Essential hypertension.  Currently controlled.  Maintain on home amlodipine and clonidine and will add on hydralazine for significant elevations.  Hold losartan for now and resume torsemide. 6. Morbid obesity.  Will need to follow-up outpatient for weight management.   DVT prophylaxis: Heparin drip Code Status: Full Family Communication: None at bedside Disposition Plan: Continue on heparin drip with further evaluation with Doppler ultrasounds as ordered.  Continue IV fluid and resume home torsemide today.  Recheck a.m. labs.  Continue on prednisone.  Consultants:   None  Procedures:   None  Antimicrobials:   None   Subjective: Patient seen and evaluated today with ongoing leg pain and swelling noted today.  He appears to have swelling to his right upper extremity today as well.  He denies any chest pain or significant shortness of breath.  Objective:  Vitals:   02/20/19 1418 02/20/19 1534 02/20/19 2109 02/21/19 0542  BP:  (!) 148/89 (!) 158/96 (!) 165/84  Pulse:  90 (!) 103 (!) 107  Resp:  20 20 (!) 24  Temp:  98.6 F (37 C) 99.8 F (37.7 C) (!) 101.3 F (38.5 C)  TempSrc:  Oral Oral Oral  SpO2: 95% 97% 98% 98%  Weight:      Height:        Intake/Output Summary (Last 24 hours) at 02/21/2019 0935 Last data filed at 02/21/2019 1497 Gross per 24 hour  Intake  1618.58 ml  Output 2600 ml  Net -981.42 ml   Filed Weights   02/20/19 0848 02/20/19 1200  Weight: 133.8 kg (!) 142 kg    Examination:  General exam: Appears calm and comfortable  Respiratory system: Clear to auscultation. Respiratory effort normal. Cardiovascular system: S1 & S2 heard, RRR. No JVD, murmurs, rubs, gallops or clicks. No pedal edema. Gastrointestinal system: Abdomen is nondistended, soft and nontender. No organomegaly or masses felt. Normal bowel sounds heard. Central nervous system: Alert and oriented. No focal neurological deficits. Extremities: Right lower extremity swelling and warmth noted. Skin: No rashes, lesions or ulcers Psychiatry: Judgement and insight appear normal. Mood & affect appropriate.     Data Reviewed: I have personally reviewed following labs and imaging studies  CBC: Recent Labs  Lab 02/20/19 0900 02/21/19 0453  WBC 11.7* 10.4  NEUTROABS 7.9*  --   HGB 11.1* 10.1*  HCT 35.1* 32.4*  MCV 78.5* 78.8*  PLT 350 026   Basic Metabolic Panel: Recent Labs  Lab 02/20/19 0900 02/21/19 0453  NA 135 133*  K 3.5 3.8  CL 97* 100  CO2 23 22  GLUCOSE 180* 220*  BUN 45* 44*  CREATININE 3.98* 3.59*  CALCIUM 8.4* 7.8*   GFR: Estimated Creatinine Clearance: 33.6 mL/min (A) (by C-G formula based on SCr of 3.59 mg/dL (H)). Liver Function Tests: Recent Labs  Lab 02/20/19 0900  AST 15  ALT 16  ALKPHOS 97  BILITOT 0.2*  PROT 8.0  ALBUMIN 3.1*   No results for input(s): LIPASE, AMYLASE in the last 168 hours. No results for input(s): AMMONIA in the last 168 hours. Coagulation Profile: No results for input(s): INR, PROTIME in the last 168 hours. Cardiac Enzymes: Recent Labs  Lab 02/20/19 0900  TROPONINI 0.03*   BNP (last 3 results) No results for input(s): PROBNP in the last 8760 hours. HbA1C: No results for input(s): HGBA1C in the last 72 hours. CBG: Recent Labs  Lab 02/20/19 1421 02/20/19 1615 02/20/19 2111 02/21/19 0735   GLUCAP 134* 236* 292* 189*   Lipid Profile: No results for input(s): CHOL, HDL, LDLCALC, TRIG, CHOLHDL, LDLDIRECT in the last 72 hours. Thyroid Function Tests: No results for input(s): TSH, T4TOTAL, FREET4, T3FREE, THYROIDAB in the last 72 hours. Anemia Panel: No results for input(s): VITAMINB12, FOLATE, FERRITIN, TIBC, IRON, RETICCTPCT in the last 72 hours. Sepsis Labs: No results for input(s): PROCALCITON, LATICACIDVEN in the last 168 hours.  Recent Results (from the past 240 hour(s))  Gram stain     Status: None   Collection Time: 02/20/19  9:42 AM  Result Value Ref Range Status   Specimen Description JOINT FLUID  Final   Special Requests Immunocompromised  Final   Gram Stain   Final    CYTOSPIN SMEAR NO ORGANISMS SEEN WBC PRESENT, PREDOMINANTLY PMN Performed at [facility], [address] [place], [place] [address]    Report Status 02/20/2019 FINAL  Final  Culture, body fluid-bottle     Status: None (Preliminary result)   Collection Time: 02/20/19  9:42 AM  Result Value Ref Range Status   Specimen Description JOINT FLUID BOTTLES DRAWN AEROBIC AND ANAEROBIC  Final   Special Requests 10CC  Final   Culture   Final    NO GROWTH < 24 HOURS Performed at [facility], [address], [place],  [address]    Report Status PENDING  Incomplete         Radiology Studies: Dg Chest Port 1 View  Result Date: 02/20/2019 CLINICAL DATA:  48 year old male with history of pain and swelling in both legs. EXAM: PORTABLE CHEST 1 VIEW COMPARISON:  No priors. FINDINGS: Lung volumes are normal. No consolidative airspace disease. No pleural effusions. No pneumothorax. No pulmonary nodule or mass noted. Pulmonary vasculature and the cardiomediastinal silhouette are within normal limits. IMPRESSION: No radiographic evidence of acute cardiopulmonary disease. Electronically Signed   By: Vinnie Langton M.D.   On: 02/20/2019 09:24        Scheduled Meds: . amLODipine  10 mg Oral Daily   . atorvastatin  80 mg Oral Daily  . cloNIDine  0.3 mg Oral TID  . [START ON 02/22/2019] ergocalciferol  50,000 Units Oral Weekly  . ezetimibe  10 mg Oral QPM  . fluticasone  1 spray Each Nare Daily  . gabapentin  100 mg Oral TID  . insulin aspart  0-15 Units Subcutaneous TID WC  . insulin aspart  0-5 Units Subcutaneous QHS  . insulin aspart  5 Units Subcutaneous TID AC  . insulin detemir  10 Units Subcutaneous BID  . linaclotide  145 mcg Oral QAC breakfast  . pantoprazole  40 mg Oral Daily  . predniSONE  40 mg Oral Q breakfast  . sodium bicarbonate  650 mg Oral BID   Continuous Infusions: . sodium chloride 125 mL/hr at 02/21/19 0821  . heparin 2,100 Units/hr (02/21/19 0915)     LOS: 1 day    Time spent: 30 minutes    Pratik Darleen Crocker, DO [facility] Pager [phone]  If 7PM-7AM, please contact night-coverage [email or web address] Password [facility] 02/21/2019, 9:35 AM

## 2019-02-21 NOTE — Progress Notes (Addendum)
[place] for Heparin Indication: Rule out DVT/PE  Allergies  Allergen Reactions  . Hydralazine   . Lisinopril Swelling    Patient Measurements: Height: 5\' 7"  (170.2 cm) Weight: (!) 313 lb 0.9 oz (142 kg) IBW/kg (Calculated) : 66.1 Heparin Dosing Weight: HEPARIN DW (KG): 100.4  Vital Signs: Temp: 101.3 F (38.5 C) (04/27 0542) Temp Source: Oral (04/27 0542) BP: 165/84 (04/27 0542) Pulse Rate: 107 (04/27 0542)  Labs: Recent Labs    02/20/19 0900 02/20/19 1601 02/20/19 2254 02/21/19 0453 02/21/19 0842  HGB 11.1*  --   --  10.1*  --   HCT 35.1*  --   --  32.4*  --   PLT 350  --   --  333  --   APTT 36  --   --   --   --   HEPARINUNFRC <0.10* 0.17* 0.11*  --  0.13*  CREATININE 3.98*  --   --  3.59*  --   TROPONINI 0.03*  --   --   --   --     Estimated Creatinine Clearance: 33.6 mL/min (A) (by C-G formula based on SCr of 3.59 mg/dL (H)).   Medical History: Past Medical History:  Diagnosis Date  . Acute diastolic CHF (congestive heart failure) ([place]) 06/11/2012   EF 50-55% [facility])  . Diabetes mellitus    A1c 11.5 (06/11/2012).  . Gout   . Hepatic steatosis 06/11/2012   Elevated LFTs  . Hyperlipemia   . Malignant hypertension   . Microcytic anemia 06/12/2012  . Obesity      Assessment: Pharmacy consulted to dose heparin for this 48 yo male with acute VTE and D-DImer elevation of 1.63 ug/mL-FEU.  He hasn't been on any   anti-coagulants prior to admission.  CBC is WNL at this time.  4/27 1000 AM update: heparin level remains low at 0.13IU/mL 4/27 1700 PM update: heparin level of 0.35 IU/mL is within therapeutic goal range  Goal of Therapy:  Heparin level 0.3-0.7 units/ml Monitor platelets by anticoagulation protocol: Yes   Plan:   Continue heparin drip at 2500 units/hr Check anti-Xa level  daily while on heparin Continue to monitor H&H and platelets  Despina Pole, Pharm. D. Clinical  Pharmacist 02/21/2019 5:04 PM

## 2019-02-21 NOTE — Progress Notes (Signed)
[place] for Heparin Indication: Rule out DVT/PE  Allergies  Allergen Reactions  . Hydralazine   . Lisinopril Swelling    Patient Measurements: Height: 5\' 7"  (170.2 cm) Weight: (!) 313 lb 0.9 oz (142 kg) IBW/kg (Calculated) : 66.1 Heparin Dosing Weight: HEPARIN DW (KG): 100.4  Vital Signs: Temp: 99.8 F (37.7 C) (04/26 2109) Temp Source: Oral (04/26 2109) BP: 158/96 (04/26 2109) Pulse Rate: 103 (04/26 2109)  Labs: Recent Labs    02/20/19 0900 02/20/19 1601 02/20/19 2254  HGB 11.1*  --   --   HCT 35.1*  --   --   PLT 350  --   --   APTT 36  --   --   HEPARINUNFRC <0.10* 0.17* 0.11*  CREATININE 3.98*  --   --   TROPONINI 0.03*  --   --     Estimated Creatinine Clearance: 30.3 mL/min (A) (by C-G formula based on SCr of 3.98 mg/dL (H)).   Medical History: Past Medical History:  Diagnosis Date  . Acute diastolic CHF (congestive heart failure) ([place]) 06/11/2012   EF 50-55% [facility])  . Diabetes mellitus    A1c 11.5 (06/11/2012).  . Gout   . Hepatic steatosis 06/11/2012   Elevated LFTs  . Hyperlipemia   . Malignant hypertension   . Microcytic anemia 06/12/2012  . Obesity      Assessment: Pharmacy consulted to dose heparin for this 48 yo male with acute VTE and D-DImer elevation of 1.63 ug/mL-FEU.  He hasn't been on any   anti-coagulants prior to admission.  CBC is WNL at this time.  4/27 AM update: heparin level remains low, no issue with infusion per RN  Goal of Therapy:  Heparin level 0.3-0.7 units/ml Monitor platelets by anticoagulation protocol: Yes   Plan:  Repeat heparin bolus of 3000 units x 1 Inc heparin drip to 2100 units/hr Check anti-Xa level in 6-8 hours and daily while on heparin Continue to monitor H&H and platelets  Narda Bonds, PharmD, BCPS Clinical Pharmacist Phone: [phone]

## 2019-02-21 NOTE — TOC Initial Note (Addendum)
Transition of Care [facility]) - Initial/Assessment Note    Patient Details  Name: Kelly Key MRN: [id number] Date of Birth: 31-Jan-1971  [facility] of [facility] CM/SW Contact:    Navon Kotowski, Chauncey Reading, RN Phone Number: 02/21/2019, 10:57 AM  Clinical Narrative:    Patient has a high readmission score. He is from home with girlfriend. Independent. Has crutches and RW at home if needed. Has PCP, still drives. Has a CBG meter, checks sugar TID. Weighs daily.  Active with [facility].   CM will make f/u appt with PCP.               Expected Discharge Plan: Home/Self Care Barriers to Discharge: No Barriers Identified   Patient Goals and CMS Choice Patient states their goals for this hospitalization and ongoing recovery are:: for his feet and legs to feel better CMS Medicare.gov Compare Post Acute Care list provided to:: Patient    Expected Discharge Plan and Services Expected Discharge Plan: Home/Self Care   Discharge Planning Services: CM Consult     Expected Discharge Date: 02/23/19                         Prior Living Arrangements/Services   Lives with:: Significant Other              Current home services: DME(crutches)    Activities of Daily Living Home Assistive Devices/Equipment: CBG Meter ADL Screening (condition at time of admission) Patient's cognitive ability adequate to safely complete daily activities?: Yes Is the patient deaf or have difficulty hearing?: No Does the patient have difficulty seeing, even when wearing glasses/contacts?: No Does the patient have difficulty concentrating, remembering, or making decisions?: No Patient able to express need for assistance with ADLs?: Yes Does the patient have difficulty dressing or bathing?: No Independently performs ADLs?: Yes (appropriate for developmental age) Does the patient have difficulty walking or climbing stairs?: No Weakness of Legs: None Weakness of Arms/Hands: None      Emotional Assessment    Attitude/Demeanor/Rapport: Engaged Affect (typically observed): Accepting Orientation: : Oriented to Self, Oriented to Place, Oriented to  Time, Oriented to Situation Alcohol / Substance Use: Not Applicable Psych Involvement: No (comment)  Admission diagnosis:  Leg swelling [M79.89] Acute renal failure superimposed on chronic kidney disease, unspecified CKD stage, unspecified acute renal failure type ([place]) [N17.9, N18.9] Patient Active Problem List   Diagnosis Date Noted  . Thromboembolism ([place]) 02/20/2019  . AKI (acute kidney injury) ([place]) 02/20/2019  . CHF exacerbation ([place]) 03/26/2018  . Acute on chronic diastolic CHF (congestive heart failure) ([place]) 03/26/2018  . Acute respiratory failure with hypoxia ([place]) 03/26/2018  . Diabetes mellitus ([place]) 06/12/2012  . Hyponatremia 06/12/2012  . Microcytic anemia 06/12/2012  . Acute diastolic CHF (congestive heart failure) ([place]) 06/11/2012  . HTN (hypertension) 06/11/2012  . Elevated LFTs 06/11/2012  . Morbid obesity ([place]) 06/11/2012  . Hepatic steatosis 06/11/2012   PCP:  [name], MD Pharmacy:   [facility] [address], [place] [address] Phone: [phone] Fax: [phone]  Readmission Risk Interventions No flowsheet data found.

## 2019-02-21 NOTE — Progress Notes (Signed)
Mobility Tech Note Patient Details Name: Kelly Key MRN: [id number] DOB: Jul 27, 1971   Patient assisted from bed to chair using RW.  5:21 PM, 02/21/19 Lonell Grandchild, MPT Physical Therapist with [facility] [phone] office [phone] mobile phone

## 2019-02-21 NOTE — Progress Notes (Signed)
Inpatient Diabetes Program Recommendations  AACE/ADA: New Consensus Statement on Inpatient Glycemic Control (2015)  Target Ranges:  Prepandial:   less than 140 mg/dL      Peak postprandial:   less than 180 mg/dL (1-2 hours)      Critically ill patients:  140 - 180 mg/dL   Lab Results  Component Value Date   GLUCAP 189 (H) 02/21/2019   HGBA1C 11.5 (H) 06/11/2012    Review of Glycemic Control Results for Kelly Key, Kelly Key (MRN [id number]) as of 02/21/2019 11:04  Ref. Range 02/20/2019 16:15 02/20/2019 21:11 02/21/2019 07:35  Glucose-Capillary Latest Ref Range: 70 - 99 mg/dL 236 (H) 292 (H) 189 (H)   Diabetes history: Type 2 DM Outpatient Diabetes medications: Levemir 15 units QAM, 45 units QPM, Novolog 5-15 units TID Current orders for Inpatient glycemic control: Levemir 10 units BID, Novolog 5 units TID, Novolog 0-15 units TID, Novolog 0-5 units QHS Prednisone 40 mg QAM  Inpatient Diabetes Program Recommendations:    Noted last A1C from 2013, consider repeating A1C to determine glycemic control.   Also, MAR notes that patient received Levemir 10 units x 3 on 4/26? Not how this was ordered, unsure if this is accurate.  However, feel that patient could benefit on more Levemir. Consider increasing Levemir to 15 units BID.  Additionally, if post prandials continue to exceed 180 mg/dL, consider increasing meal coverage to Novolog 7 units TID (assuming that patient is consuming >50%).  Thanks, Bronson Curb, MSN, RNC-OB Diabetes Coordinator [phone] (8a-5p)

## 2019-02-22 ENCOUNTER — Other Ambulatory Visit: Payer: Self-pay | Admitting: *Deleted

## 2019-02-22 ENCOUNTER — Other Ambulatory Visit: Payer: Self-pay

## 2019-02-22 LAB — GLUCOSE, CAPILLARY
Glucose-Capillary: 258 mg/dL — ABNORMAL HIGH (ref 70–99)
Glucose-Capillary: 256 mg/dL — ABNORMAL HIGH (ref 70–99)
Glucose-Capillary: 253 mg/dL — ABNORMAL HIGH (ref 70–99)
Glucose-Capillary: 327 mg/dL — ABNORMAL HIGH (ref 70–99)

## 2019-02-22 LAB — BASIC METABOLIC PANEL
Anion gap: 12 (ref 5–15)
BUN: 56 mg/dL — ABNORMAL HIGH (ref 6–20)
CO2: 20 mmol/L — ABNORMAL LOW (ref 22–32)
Calcium: 7.9 mg/dL — ABNORMAL LOW (ref 8.9–10.3)
Chloride: 100 mmol/L (ref 98–111)
Creatinine, Ser: 3.65 mg/dL — ABNORMAL HIGH (ref 0.61–1.24)
GFR calc Af Amer: 21 mL/min — ABNORMAL LOW (ref >60)
GFR calc non Af Amer: 18 mL/min — ABNORMAL LOW (ref >60)
Glucose, Bld: 278 mg/dL — ABNORMAL HIGH (ref 70–99)
Potassium: 4 mmol/L (ref 3.5–5.1)
Sodium: 132 mmol/L — ABNORMAL LOW (ref 135–145)

## 2019-02-22 LAB — CBC
HCT: 30 % — ABNORMAL LOW (ref 39.0–52.0)
Hemoglobin: 9.3 g/dL — ABNORMAL LOW (ref 13.0–17.0)
MCH: 24.2 pg — ABNORMAL LOW (ref 26.0–34.0)
MCHC: 31 g/dL (ref 30.0–36.0)
MCV: 78.1 fL — ABNORMAL LOW (ref 80.0–100.0)
Platelets: 349 10*3/uL (ref 150–400)
RBC: 3.84 MIL/uL — ABNORMAL LOW (ref 4.22–5.81)
RDW: 13.9 % (ref 11.5–15.5)
WBC: 11.4 10*3/uL — ABNORMAL HIGH (ref 4.0–10.5)
nRBC: 0 % (ref 0.0–0.2)

## 2019-02-22 LAB — CK: Total CK: 475 U/L — ABNORMAL HIGH (ref 49–397)

## 2019-02-22 LAB — MAGNESIUM: Magnesium: 2 mg/dL (ref 1.7–2.4)

## 2019-02-22 LAB — HEPARIN LEVEL (UNFRACTIONATED): Heparin Unfractionated: 0.4 IU/mL (ref 0.30–0.70)

## 2019-02-22 MED ORDER — INSULIN DETEMIR 100 UNIT/ML ~~LOC~~ SOLN
15.0000 [IU] | Freq: Two times a day (BID) | SUBCUTANEOUS | Status: DC
  Administered 2019-02-22 – 2019-02-23 (×2): 15 [IU] via SUBCUTANEOUS
  Filled 2019-02-22 (×4): qty 0.15

## 2019-02-22 MED ORDER — INSULIN ASPART 100 UNIT/ML ~~LOC~~ SOLN
7.0000 [IU] | Freq: Three times a day (TID) | SUBCUTANEOUS | Status: DC
  Administered 2019-02-22 (×2): 7 [IU] via SUBCUTANEOUS

## 2019-02-22 NOTE — Plan of Care (Signed)
  Problem: Acute Rehab PT Goals(only PT should resolve) Goal: Pt Will Go Supine/Side To Sit Outcome: Progressing Flowsheets (Taken 02/22/2019 1301) Pt will go Supine/Side to Sit: with min guard assist Goal: Patient Will Transfer Sit To/From Stand Outcome: Progressing Flowsheets (Taken 02/22/2019 1301) Patient will transfer sit to/from stand: with min guard assist Goal: Pt Will Transfer Bed To Chair/Chair To Bed Outcome: Progressing Flowsheets (Taken 02/22/2019 1301) Pt will Transfer Bed to Chair/Chair to Bed: min guard assist Goal: Pt Will Ambulate Outcome: Progressing Flowsheets (Taken 02/22/2019 1301) Pt will Ambulate: 50 feet; with supervision; with rolling walker   1:02 PM, 02/22/19 Lonell Grandchild, MPT Physical Therapist with [facility] 336 [phone] office [phone] mobile phone

## 2019-02-22 NOTE — Progress Notes (Signed)
PROGRESS NOTE    Kelly Key  ASN:[id number] DOB: 1970-11-24 DOA: 02/20/2019 PCP: [name], MD   Brief Narrative:  Per HPI: Kelly Key a 47 y.o.malewith medical history significant forchronic diastolic CHF with LVEF 60 to 65% in 03/2018, morbid obesity, diabetes, gout,dyslipidemia, and CKD stage IV who presented to the ED with right lower extremity swelling and pain that began approximately 2 days ago that has been gradually worsening. He denies any prior history of blood clots and denies any chest pain or shortness of breath. He denies fevers, or chills, but does appear to have some inspiratory pleuritic chest pain on the left side. He denies any specific aggravating or alleviating factors and states that he has been trying to lose weight, but quickly gains it back and has been cycling with his weights a lot recently. He sees nephrology in the outpatient setting and typically has baseline creatinine 2.93 and is noted to have CKD stage IV. He has been recommended to initiate dialysis soon, but is reluctant to do so.  4/28: Patient has undergone Doppler of his right lower extremity with no noted DVT.  Will discontinue heparin drip as I do not suspect he has PE.  He appears to have pseudogout based on joint fluid analysis from tap in ED.  Gram stain and culture fluid negative.  Extreme swelling to his right lower limb and monitoring for compartment syndrome as well as rhabdomyolysis.  CK levels mildly elevated at this time and may be contributing to some of his AKI.  Continuing on aggressive IV fluid and monitoring labs. PT evaluation ordered today.  Assessment & Plan:   Principal Problem:   Acute respiratory failure with hypoxia (HCC) Active Problems:   HTN (hypertension)   Morbid obesity ([place])   Diabetes mellitus ([place])   Thromboembolism ([place])   AKI (acute kidney injury) ([place])   1. Acute hypoxemic respiratory failure with mild tachypnea and tachycardia in the setting of right  lower extremity edema. This has now improved and patient is off oxygen.  There is initial concern for DVT/PE, but there is no noted DVT and I do not suspect PE.  Will discontinue heparin drip at this time.  Mild d-dimer elevation likely related to acute gout flare at this time.  Continue to monitor closely. 2. AKI on CKD stage IV. Will maintain on IV fluid for now and assess response. Baseline creatinine noted to be 2.93 with GFR of 28 on recent nephrology visit on 12/2018. Will recheck labs in a.m. and monitor strict I's and O's as well as daily weights and consider nephrology consultation should there not be much improvement noted. No urgent need for dialysis currently. With hold nephrotoxic agents at this time, but will resume home torsemide (resume 4/27). 3. Swollen right kneewith signs of pseudogout. Status post joint tap in ED. no findings on Gram stain or culture noted. Will continue prednisone 40 mg daily and add morphine as needed for breakthrough pain.   Uric acid level elevated at 9.9.  Started on gabapentin yesterday which is helping the pain.  Have to avoid NSAIDs on account of AKI.  Procalcitonin level noted to be low yesterday and I do not suspect cellulitis. 4. Type 2 diabetes with steroid-induced hyperglycemia. Patient is insulin-dependent. Will maintain on mealtime and long-acting insulin at half the usual dose and keep on carb modified diet with SSI to be added on.  Increased mealtime insulin dosage as well as long-acting insulin today. 5. Essential hypertension. Currently controlled. Maintain  on home amlodipine and clonidine and will add on hydralazine for significantelevations. Hold losartan for now and maintain on torsemide to avoid volume overload given poor renal function. 6. Morbid obesity. Will need to follow-up outpatient for weight management.   DVT prophylaxis:  Discontinue heparin drip and placed on heparin DVT prophylaxis Code Status: Full Family  Communication: None at bedside Disposition Plan:  Discontinue heparin drip and maintain on aggressive IV fluid hydration and monitor CK levels.  Maintain on prednisone for pseudogout flare that appears to be the main cause of his symptoms.  Monitor AKI and consult nephrology as needed.  Currently maintaining great urine output and does not have significant lab abnormalities.  PT evaluation ordered and pending today.  Consultants:   None  Procedures:   None  Antimicrobials:   None  Subjective: Patient seen and evaluated today with ongoing pain to his right lower extremity, but is mildly improved from yesterday.  He denies any chest pain or shortness of breath.  He states that this pain is similar to his previous gout flares.  Doppler ultrasound of dorsalis pedis and posterior tibialis pulses obtained and were present.  Objective: Vitals:   02/21/19 1939 02/21/19 2131 02/22/19 0500 02/22/19 0518  BP:  (!) 151/86  (!) 154/94  Pulse:  81  78  Resp:  16  16  Temp:  98.3 F (36.8 C)  98.1 F (36.7 C)  TempSrc:  Oral  Oral  SpO2: 97% 95%  98%  Weight:   (!) 147 kg   Height:        Intake/Output Summary (Last 24 hours) at 02/22/2019 1149 Last data filed at 02/22/2019 0800 Gross per 24 hour  Intake 6472.5 ml  Output 2075 ml  Net 4397.5 ml   Filed Weights   02/20/19 0848 02/20/19 1200 02/22/19 0500  Weight: 133.8 kg (!) 142 kg (!) 147 kg    Examination:  General exam: Appears mildly anxious, obese Respiratory system: Clear to auscultation. Respiratory effort normal. Cardiovascular system: S1 & S2 heard, RRR. No JVD, murmurs, rubs, gallops or clicks. No pedal edema. Gastrointestinal system: Abdomen is nondistended, soft and nontender. No organomegaly or masses felt. Normal bowel sounds heard. Central nervous system: Alert and oriented. No focal neurological deficits. Extremities: Right lower extremity with tense edema noted.  Sensation to light touch is intact and patient  can minimally flex and extend his toes.  Dorsalis pedis and posterior tibialis Dopplers were obtained and present.  Extremities warm to touch. Skin: No rashes, lesions or ulcers Psychiatry: Judgement and insight appear normal. Mood & affect appropriate.     Data Reviewed: I have personally reviewed following labs and imaging studies  CBC: Recent Labs  Lab 02/20/19 0900 02/21/19 0453 02/22/19 0506  WBC 11.7* 10.4 11.4*  NEUTROABS 7.9*  --   --   HGB 11.1* 10.1* 9.3*  HCT 35.1* 32.4* 30.0*  MCV 78.5* 78.8* 78.1*  PLT 350 333 034   Basic Metabolic Panel: Recent Labs  Lab 02/20/19 0900 02/21/19 0453 02/21/19 2231 02/22/19 0506  NA 135 133*  --  132*  K 3.5 3.8  --  4.0  CL 97* 100  --  100  CO2 23 22  --  20*  GLUCOSE 180* 220* 456* 278*  BUN 45* 44*  --  56*  CREATININE 3.98* 3.59*  --  3.65*  CALCIUM 8.4* 7.8*  --  7.9*  MG  --   --   --  2.0   GFR: Estimated Creatinine  Clearance: 33.7 mL/min (A) (by C-G formula based on SCr of 3.65 mg/dL (H)). Liver Function Tests: Recent Labs  Lab 02/20/19 0900  AST 15  ALT 16  ALKPHOS 97  BILITOT 0.2*  PROT 8.0  ALBUMIN 3.1*   No results for input(s): LIPASE, AMYLASE in the last 168 hours. No results for input(s): AMMONIA in the last 168 hours. Coagulation Profile: No results for input(s): INR, PROTIME in the last 168 hours. Cardiac Enzymes: Recent Labs  Lab 02/20/19 0900 02/22/19 0506  CKTOTAL  --  475*  TROPONINI 0.03*  --    BNP (last 3 results) No results for input(s): PROBNP in the last 8760 hours. HbA1C: No results for input(s): HGBA1C in the last 72 hours. CBG: Recent Labs  Lab 02/21/19 1133 02/21/19 1648 02/21/19 2133 02/22/19 0744 02/22/19 1144  GLUCAP 227* 367* 457* 258* 256*   Lipid Profile: No results for input(s): CHOL, HDL, LDLCALC, TRIG, CHOLHDL, LDLDIRECT in the last 72 hours. Thyroid Function Tests: No results for input(s): TSH, T4TOTAL, FREET4, T3FREE, THYROIDAB in the last 72  hours. Anemia Panel: No results for input(s): VITAMINB12, FOLATE, FERRITIN, TIBC, IRON, RETICCTPCT in the last 72 hours. Sepsis Labs: Recent Labs  Lab 02/21/19 0934  PROCALCITON 0.39    Recent Results (from the past 240 hour(s))  Gram stain     Status: None   Collection Time: 02/20/19  9:42 AM  Result Value Ref Range Status   Specimen Description JOINT FLUID  Final   Special Requests Immunocompromised  Final   Gram Stain   Final    CYTOSPIN SMEAR NO ORGANISMS SEEN WBC PRESENT, PREDOMINANTLY PMN Performed at [facility], [address], [place], [place] [address]    Report Status 02/20/2019 FINAL  Final  Culture, body fluid-bottle     Status: None (Preliminary result)   Collection Time: 02/20/19  9:42 AM  Result Value Ref Range Status   Specimen Description JOINT FLUID BOTTLES DRAWN AEROBIC AND ANAEROBIC  Final   Special Requests 10CC  Final   Culture   Final    NO GROWTH 2 DAYS Performed at [facility], [address] [place], [place] [address]    Report Status PENDING  Incomplete         Radiology Studies: US Venous Img Lower Unilateral Right  Result Date: 02/21/2019 CLINICAL DATA:  Swelling since yesterday, pain, recent right knee arthrocentesis EXAM: RIGHT LOWER EXTREMITY VENOUS DOPPLER ULTRASOUND TECHNIQUE: Gray-scale sonography with compression, as well as color and duplex ultrasound, were performed to evaluate the deep venous system from the level of the common femoral vein through the popliteal and proximal calf veins. COMPARISON:  None FINDINGS: Normal compressibility of the common femoral, superficial femoral, and popliteal veins, as well as the proximal calf veins. No filling defects to suggest DVT on grayscale or color Doppler imaging. Doppler waveforms show normal direction of venous flow, normal respiratory phasicity and response to augmentation. Visualized segments of the saphenous venous system normal in caliber and compressibility. Survey views of the  contralateral common femoral vein are unremarkable. IMPRESSION: No femoropopliteal and no calf DVT in the visualized calf veins. If clinical symptoms are inconsistent or if there are persistent or worsening symptoms, further imaging (possibly involving the iliac veins) may be warranted. Electronically Signed   By: Lucrezia Europe M.D.   On: 02/21/2019 12:33        Scheduled Meds:  amLODipine  10 mg Oral Daily   atorvastatin  80 mg Oral Daily   cloNIDine  0.3 mg Oral TID   ergocalciferol  50,000 Units Oral Weekly   ezetimibe  10 mg Oral QPM   fluticasone  1 spray Each Nare Daily   gabapentin  100 mg Oral TID   insulin aspart  0-15 Units Subcutaneous TID WC   insulin aspart  0-5 Units Subcutaneous QHS   insulin aspart  7 Units Subcutaneous TID AC   insulin detemir  15 Units Subcutaneous BID   linaclotide  145 mcg Oral QAC breakfast   pantoprazole  40 mg Oral Daily   predniSONE  40 mg Oral Q breakfast   sodium bicarbonate  650 mg Oral BID   torsemide  20 mg Oral TID   Continuous Infusions:  sodium chloride 125 mL/hr at 02/22/19 0911     LOS: 2 days    Time spent: 30 minutes    Alessandro Griep Darleen Crocker, DO [facility] Pager [phone]  If 7PM-7AM, please contact night-coverage [email or web address] Password [facility] 02/22/2019, 11:49 AM

## 2019-02-22 NOTE — Progress Notes (Signed)
Right Dorsalis Pedal and posterior tibial pulses positive with a rate of 86 via doppler.

## 2019-02-22 NOTE — Patient Outreach (Signed)
[facility]) Care Management  02/22/2019  ARIO MCDIARMID 1971-10-19 [id number]    RN Health Coach Notified that the patient was admitted to the hospital on 02/20/19 for Acute renal Failure.  Hospital Liaison notified.  Plan: RN Health Coach will close the program due to Hospitalization.  Send closure letter to the Primary care.  Lazaro Arms RN, BSN, [place] Direct Dial:  [phone]  Fax: [phone]

## 2019-02-22 NOTE — Progress Notes (Signed)
Kelly Key for Heparin Indication: Rule out DVT/PE  Allergies  Allergen Reactions  . Hydralazine   . Lisinopril Swelling    Patient Measurements: Height: 5\' 7"  (170.2 cm) Weight: (!) 324 lb 1.6 oz (147 kg) IBW/kg (Calculated) : 66.1 Heparin Dosing Weight: HEPARIN DW (KG): 100.4  Vital Signs: Temp: 98.1 F (36.7 C) (04/28 0518) Temp Source: Oral (04/28 0518) BP: 154/94 (04/28 0518) Pulse Rate: 78 (04/28 0518)  Labs: Recent Labs    02/20/19 0900  02/21/19 0453 02/21/19 0842 02/21/19 1533 02/22/19 0506 02/22/19 0749  HGB 11.1*  --  10.1*  --   --  9.3*  --   HCT 35.1*  --  32.4*  --   --  30.0*  --   PLT 350  --  333  --   --  349  --   APTT 36  --   --   --   --   --   --   HEPARINUNFRC <0.10*   < >  --  0.13* 0.35  --  0.40  CREATININE 3.98*  --  3.59*  --   --  3.65*  --   TROPONINI 0.03*  --   --   --   --   --   --    < > = values in this interval not displayed.    Estimated Creatinine Clearance: 33.7 mL/min (A) (by C-G formula based on SCr of 3.65 mg/dL (H)).   Medical History: Past Medical History:  Diagnosis Date  . Acute diastolic CHF (congestive heart failure) ([place]) 06/11/2012   EF 50-55% [facility])  . Diabetes mellitus    A1c 11.5 (06/11/2012).  . Gout   . Hepatic steatosis 06/11/2012   Elevated LFTs  . Hyperlipemia   . Malignant hypertension   . Microcytic anemia 06/12/2012  . Obesity      Assessment: Pharmacy consulted to dose heparin for this 48 yo male with acute VTE and D-DImer elevation of 1.63 ug/mL-FEU.  He hasn't been on any   anti-coagulants prior to admission.  CBC is WNL at this time.  heparin level of 0.4 IU/mL is within therapeutic goal range  Goal of Therapy:  Heparin level 0.3-0.7 units/ml Monitor platelets by anticoagulation protocol: Yes   Plan:  Continue heparin drip at 2500 units/hr Check anti-Xa level  daily while on heparin Continue to monitor H&H and platelets  Margot Ables, PharmD Clinical Pharmacist 02/22/2019 8:32 AM

## 2019-02-22 NOTE — Evaluation (Signed)
Physical Therapy Evaluation Patient Details Name: Kelly Key MRN: [id number] DOB: 1971-08-26 Today's Date: 02/22/2019   History of Present Illness  Kelly Key is a 48 y.o. male with medical history significant for chronic diastolic CHF with LVEF 60 to 65% in 03/2018, morbid obesity, diabetes, gout, dyslipidemia, and CKD stage IV who presented to the ED with right lower extremity swelling and pain that began approximately 2 days ago that has been gradually worsening.  He denies any prior history of blood clots and denies any chest pain or shortness of breath.  He denies fevers, or chills, but does appear to have some inspiratory pleuritic chest pain on the left side.  He denies any specific aggravating or alleviating factors and states that he has been trying to lose weight, but quickly gains it back and has been cycling with his weights a lot recently.  He sees nephrology in the outpatient setting and typically has baseline creatinine 2.93 and is noted to have CKD stage IV.  He has been recommended to initiate dialysis soon, but is reluctant to do so.    Clinical Impression  Patient requires much time and frequent rest breaks to complete functional tasks due to severe pain right foot/ankle and tightness/pain in knees.  Patient has most difficulty with sit to stands requiring verbal/tactile cueing for proper hand placement, able to ambulate out side of room, but couldn't go farther due increasing RLE pain.  Patient tolerated staying up in chair after therapy.  Patient will benefit from continued physical therapy in hospital and recommended venue below to increase strength, balance, endurance for safe ADLs and gait.    Follow Up Recommendations Home health PT;Supervision for mobility/OOB;Supervision - Intermittent    Equipment Recommendations  None recommended by PT    Recommendations for Other Services       Precautions / Restrictions Precautions Precautions: Fall Restrictions Weight Bearing  Restrictions: No      Mobility  Bed Mobility Overal bed mobility: Needs Assistance Bed Mobility: Supine to Sit;Sit to Supine     Supine to sit: Min assist Sit to supine: Min assist   General bed mobility comments: requires assistance to move RLE  Transfers Overall transfer level: Needs assistance Equipment used: Rolling walker (2 wheeled) Transfers: Sit to/from [facility] Sit to Stand: Min assist Stand pivot transfers: Min assist       General transfer comment: slow labored movement  Ambulation/Gait Ambulation/Gait assistance: Min guard Gait Distance (Feet): 30 Feet Assistive device: Rolling walker (2 wheeled) Gait Pattern/deviations: Decreased step length - right;Decreased step length - left;Decreased stride length Gait velocity: decreased   General Gait Details: slow labored cadence with limited weightbearing on RLE due to foot/ankle pain, no loss of balance  Stairs            Wheelchair Mobility    Modified Rankin (Stroke Patients Only)       Balance Overall balance assessment: Needs assistance Sitting-balance support: Feet supported;No upper extremity supported Sitting balance-Leahy Scale: Good     Standing balance support: Bilateral upper extremity supported;During functional activity Standing balance-Leahy Scale: Fair Standing balance comment: using RW                             Pertinent Vitals/Pain Pain Assessment: 0-10 Pain Score: 9  Pain Location: right foot/ankle Pain Descriptors / Indicators: Aching;Sore Pain Intervention(s): Premedicated before session;Monitored during session;Limited activity within patient's tolerance    Home Living Family/patient expects  to be discharged to:: Private residence Living Arrangements: Spouse/significant other Available Help at Discharge: Family Type of Home: House Home Access: Level entry     [place]: One [place]: Crutches;Walker - 2 wheels;Cane -  single point      Prior Function Level of Independence: Independent         Comments: community ambulator, drives     Journalist, newspaper        Extremity/Trunk Assessment   Upper Extremity Assessment Upper Extremity Assessment: Overall WFL for tasks assessed    Lower Extremity Assessment Lower Extremity Assessment: Generalized weakness;RLE deficits/detail;LLE deficits/detail RLE Deficits / Details: grossly 3+/5 LLE Deficits / Details: grossly 4-/5 LLE: Unable to fully assess due to pain    Cervical / Trunk Assessment Cervical / Trunk Assessment: Normal  Communication   Communication: No difficulties  Cognition Arousal/Alertness: Awake/alert Behavior During Therapy: WFL for tasks assessed/performed Overall Cognitive Status: Within Functional Limits for tasks assessed                                        General Comments      Exercises     Assessment/Plan    PT Assessment Patient needs continued PT services  PT Problem List Decreased strength;Decreased activity tolerance;Decreased balance;Decreased mobility;Pain       PT Treatment Interventions Therapeutic exercise;Gait training;Stair training;Functional mobility training;Therapeutic activities;Patient/family education    PT Goals (Current goals can be found in the Care Plan section)  Acute Rehab PT Goals Patient Stated Goal: return home with spouse to assist PT Goal Formulation: With patient Time For Goal Achievement: 03/01/19 Potential to Achieve Goals: Good    Frequency Min 4X/week   Barriers to discharge        Co-evaluation               AM-PAC PT "6 Clicks" Mobility  Outcome Measure Help needed turning from your back to your side while in a flat bed without using bedrails?: A Little Help needed moving from lying on your back to sitting on the side of a flat bed without using bedrails?: A Little Help needed moving to and from a bed to a chair (including a wheelchair)?: A  Little Help needed standing up from a chair using your arms (e.g., wheelchair or bedside chair)?: A Little Help needed to walk in hospital room?: A Little Help needed climbing 3-5 steps with a railing? : A Lot 6 Click Score: 17    End of Session Equipment Utilized During Treatment: Gait belt Activity Tolerance: Patient tolerated treatment well;Patient limited by fatigue Patient left: in chair;with call bell/phone within reach Nurse Communication: Mobility status PT Visit Diagnosis: Unsteadiness on feet (R26.81);Other abnormalities of gait and mobility (R26.89);Muscle weakness (generalized) (M62.81)    Time: 9381-0175 PT Time Calculation (min) (ACUTE ONLY): 50 min   Charges:   PT Evaluation $PT Eval High Complexity: 1 High PT Treatments $Gait Training: 8-22 mins $Therapeutic Activity: 23-37 mins        12:58 PM, 02/22/19 Lonell Grandchild, MPT Physical Therapist with [facility] [phone] office [phone] mobile phone

## 2019-02-22 NOTE — TOC Progression Note (Signed)
Transition of Care [facility]) - Progression Note    Patient Details  Name: Kelly Key MRN: [id number] Date of Birth: [birth year]  Transition of Care [facility]) CM/SW Contact  Rubi Tooley, Chauncey Reading, RN Phone Number: 02/22/2019, 11:19 AM  Clinical Narrative:   Patient seen by PT, recommended for [facility]. Patient agreeable, no preference on providers. Santiago Glad of [facility] can accept referral and can see patient day after DC. Will need home health PT order.     Expected Discharge Plan: Home/Self Care Barriers to Discharge: No Barriers Identified   Expected Discharge Plan and Services Expected Discharge Plan: Home/Self Care   Discharge Planning Services: CM Consult Post Acute Care Choice: Home Health   Expected Discharge Date: 02/23/19                         HH Arranged: PT HH Agency: [facility] Date HH Agency Contacted: 02/22/19 Time [place]: 1132     Expected Discharge Plan and Services Expected Discharge Plan: Home/Self Care   Discharge Planning Services: CM Consult     Expected Discharge Date: 02/23/19                     Readmission Risk Interventions Readmission Risk Prevention Plan 02/21/2019  Transportation Screening Complete  PCP or Specialist Appt within 3-5 Days Complete  HRI or [place] Not Complete  HRI or Home Care Consult comments not needed, independent, active with [facility]  Social Work Consult for [place] Planning/Counseling Complete  Palliative Care Screening Not Applicable  Medication Review Press photographer) Complete  Some recent data might be hidden

## 2019-02-22 NOTE — Progress Notes (Signed)
Patient's right dorsalis pedal and posterior tibial pulses positive with a rate of 84, doppler used

## 2019-02-22 NOTE — Patient Outreach (Signed)
Chart entry per [facility] to close program.  Jacqlyn Larsen [facility], BSN [place] Coordinator [phone]

## 2019-02-23 ENCOUNTER — Inpatient Hospital Stay (HOSPITAL_COMMUNITY): Payer: Medicare Other

## 2019-02-23 DIAGNOSIS — E1122 Type 2 diabetes mellitus with diabetic chronic kidney disease: Secondary | ICD-10-CM

## 2019-02-23 DIAGNOSIS — Z794 Long term (current) use of insulin: Secondary | ICD-10-CM

## 2019-02-23 DIAGNOSIS — N183 Chronic kidney disease, stage 3 (moderate): Secondary | ICD-10-CM

## 2019-02-23 DIAGNOSIS — N179 Acute kidney failure, unspecified: Secondary | ICD-10-CM

## 2019-02-23 DIAGNOSIS — I749 Embolism and thrombosis of unspecified artery: Secondary | ICD-10-CM

## 2019-02-23 DIAGNOSIS — I10 Essential (primary) hypertension: Secondary | ICD-10-CM

## 2019-02-23 LAB — CBC
HCT: 30.9 % — ABNORMAL LOW (ref 39.0–52.0)
Hemoglobin: 9.9 g/dL — ABNORMAL LOW (ref 13.0–17.0)
MCH: 25 pg — ABNORMAL LOW (ref 26.0–34.0)
MCHC: 32 g/dL (ref 30.0–36.0)
MCV: 78 fL — ABNORMAL LOW (ref 80.0–100.0)
Platelets: 387 10*3/uL (ref 150–400)
RBC: 3.96 MIL/uL — ABNORMAL LOW (ref 4.22–5.81)
RDW: 13.7 % (ref 11.5–15.5)
WBC: 10.2 10*3/uL (ref 4.0–10.5)
nRBC: 0 % (ref 0.0–0.2)

## 2019-02-23 LAB — BASIC METABOLIC PANEL
Anion gap: 13 (ref 5–15)
BUN: 61 mg/dL — ABNORMAL HIGH (ref 6–20)
CO2: 19 mmol/L — ABNORMAL LOW (ref 22–32)
Calcium: 8.1 mg/dL — ABNORMAL LOW (ref 8.9–10.3)
Chloride: 102 mmol/L (ref 98–111)
Creatinine, Ser: 3.52 mg/dL — ABNORMAL HIGH (ref 0.61–1.24)
GFR calc Af Amer: 22 mL/min — ABNORMAL LOW (ref >60)
GFR calc non Af Amer: 19 mL/min — ABNORMAL LOW (ref >60)
Glucose, Bld: 309 mg/dL — ABNORMAL HIGH (ref 70–99)
Potassium: 4.3 mmol/L (ref 3.5–5.1)
Sodium: 134 mmol/L — ABNORMAL LOW (ref 135–145)

## 2019-02-23 LAB — GLUCOSE, CAPILLARY
Glucose-Capillary: 282 mg/dL — ABNORMAL HIGH (ref 70–99)
Glucose-Capillary: 334 mg/dL — ABNORMAL HIGH (ref 70–99)
Glucose-Capillary: 257 mg/dL — ABNORMAL HIGH (ref 70–99)

## 2019-02-23 LAB — CK: Total CK: 421 U/L — ABNORMAL HIGH (ref 49–397)

## 2019-02-23 MED ORDER — INSULIN DETEMIR 100 UNIT/ML ~~LOC~~ SOLN: 15.0000 [IU] | Freq: Two times a day (BID) | SUBCUTANEOUS | 11 refills | Status: DC

## 2019-02-23 MED ORDER — SIMETHICONE 40 MG/0.6ML PO SUSP
ORAL | Status: AC
  Administered 2019-02-23: 08:00:00
  Filled 2019-02-23: qty 30

## 2019-02-23 MED ORDER — PREDNISONE 20 MG PO TABS: 40.0000 mg | ORAL_TABLET | Freq: Every day | ORAL | 0 refills | Status: AC

## 2019-02-23 MED ORDER — TORSEMIDE 20 MG PO TABS: 20.0000 mg | ORAL_TABLET | Freq: Three times a day (TID) | ORAL | Status: DC

## 2019-02-23 MED ORDER — POTASSIUM CHLORIDE CRYS ER 20 MEQ PO TBCR: 20.0000 meq | EXTENDED_RELEASE_TABLET | Freq: Every day | ORAL | Status: DC

## 2019-02-23 MED ORDER — AMLODIPINE BESYLATE 5 MG PO TABS
10.0000 mg | ORAL_TABLET | Freq: Every day | ORAL | Status: DC
  Administered 2019-02-23: 10 mg via ORAL
  Filled 2019-02-23 (×2): qty 2

## 2019-02-23 MED ORDER — CLONIDINE HCL 0.2 MG PO TABS
0.3000 mg | ORAL_TABLET | Freq: Three times a day (TID) | ORAL | Status: DC
  Administered 2019-02-23 (×2): 0.3 mg via ORAL
  Filled 2019-02-23 (×2): qty 1

## 2019-02-23 MED ORDER — INSULIN ASPART 100 UNIT/ML ~~LOC~~ SOLN
10.0000 [IU] | Freq: Three times a day (TID) | SUBCUTANEOUS | Status: DC
  Administered 2019-02-23: 10 [IU] via SUBCUTANEOUS

## 2019-02-23 MED ORDER — INSULIN ASPART 100 UNIT/ML ~~LOC~~ SOLN: 14.0000 [IU] | Freq: Three times a day (TID) | SUBCUTANEOUS | Status: DC

## 2019-02-23 NOTE — Progress Notes (Signed)
Patient c/o shortness of breath while laying on his right side. O2 at 3L [place] added. Patient sitting up on side of bed. BP elevated 193/110. MD notified and orders placed. Will continue to monitor.

## 2019-02-23 NOTE — Care Management Important Message (Signed)
Important Message  Patient Details  Name: Kelly Key MRN: [id number] Date of Birth: 1970/11/01   Medicare Important Message Given:  Yes    [name] 02/23/2019, 2:21 PM

## 2019-02-23 NOTE — TOC Transition Note (Signed)
Transition of Care [facility]) - CM/SW Discharge Note   Patient Details  Name: Kelly Key MRN: [id number] Date of Birth: 01-May-1971  Transition of Care [facility]) CM/SW Contact:  Nathanal Hermiz, Chauncey Reading, RN Phone Number: 02/23/2019, 3:19 PM   Clinical Narrative:   Patient discharging today. Santiago Glad of [employer] notified and will obtain orders via Epic.     Final next level of care: Home/Self Care Barriers to Discharge: No Barriers Identified   Patient Goals and CMS Choice Patient states their goals for this hospitalization and ongoing recovery are:: for his feet and legs to feel better CMS Medicare.gov Compare Post Acute Care list provided to:: Patient         Discharge Plan and Services   Discharge Planning Services: CM Consult Post Acute Care Choice: Home Health                    HH Arranged: PT [place]: [place] Date [place]: 02/22/19 Time HH Agency Contacted: 1132    Social Determinants of Health (SDOH) Interventions     Readmission Risk Interventions Readmission Risk Prevention Plan 02/21/2019  Transportation Screening Complete  PCP or Specialist Appt within 3-5 Days Complete  HRI or Agency Not Complete  HRI or Home Care Consult comments not needed, independent, active with [place] for [place] Planning/Counseling Complete  Palliative Care Screening Not Applicable  Medication Review [employer]) Complete  Some recent data might be hidden

## 2019-02-23 NOTE — Discharge Summary (Signed)
Physician Discharge Summary  Kelly Key GLO:[id number] DOB: [birth year] DOA: 02/20/2019  PCP: Kelly Gravel, MD Nephrology: Dr. Lowanda Key  Admit date: 02/20/2019 Discharge date: 02/23/2019  Admitted From: Home  Disposition: Home   Recommendations for Outpatient Follow-up:  1. Follow up with PCP in 1 weeks 2. Follow up with nephrologist in 1-2 weeks  3. Please obtain BMP/CBC in 1-2 weeks  Home Health:  Kelly Key  Discharge Condition: STABLE   CODE STATUS: FULL    Brief Hospitalization Summary: Please see all hospital notes, images, labs for full details of the hospitalization. Dr. Trena Key HPI: Kelly Key is a 48 y.o. male with medical history significant for chronic diastolic CHF with LVEF 60 to 65% in 03/2018, morbid obesity, diabetes, gout, dyslipidemia, and CKD stage IV who presented to the ED with right lower extremity swelling and pain that began approximately 2 days ago that has been gradually worsening.  He denies any prior history of blood clots and denies any chest pain or shortness of breath.  He denies fevers, or chills, but does appear to have some inspiratory pleuritic chest pain on the left side.  He denies any specific aggravating or alleviating factors and states that he has been trying to lose weight, but quickly gains it back and has been cycling with his weights a lot recently.  He sees nephrology in the outpatient setting and typically has baseline creatinine 2.93 and is noted to have CKD stage IV.  He has been recommended to initiate dialysis soon, but is reluctant to do so.   ED Course: Vital signs initially demonstrated significant hypertension which has now improved.  He was noted to have severe right lower extremity swelling and pain particularly to his knee on the right side for which the joint was tapped and sample sent to lab.  There was concern for pulmonary embolism and DVT, but unfortunately CT angiogram could not be performed given patient's renal function and therefore, he  was started on IV heparin drip.  He was noted to be somewhat hypoxemic for which she has been placed on 2 L nasal cannula.  Mild d-dimer elevation noted, chest x-ray with no acute abnormalities and BNP within reasonable limits.  Hospital Course:  1. Acute hypoxemic respiratory failure with mild tachypnea and tachycardia in the setting of right lower extremity edema. This has now improved and patient is off oxygen.  There was initial concern for DVT/PE, but there is no noted DVT and I do not suspect PE.  Mild d-dimer elevation thought related to acute gout flare at this time. His symptoms have resolved and he is back to his baseline status.   2. AKI on CKD stage IV. He was treated with IV fluids. Baseline creatinine noted to be 2.93 with GFR of 28 on recent nephrology visit on 12/2018. He was advised to follow up with nephrologist in 1-2 weeks.  His losartan is being held until he can follow up with his nephrologist.  He has been restarted on his home torsemide.   3. Swollen right kneewith signs of pseudogout. Status post joint tap in ED. no findings on Gram stain or culture noted. Will continue prednisone 40 mg daily.Uric acid level elevated at 9.9. Procalcitonin level noted to be low yesterday and I do not suspect cellulitis. 4. Type 2 diabetes with steroid-induced hyperglycemia. Patient is insulin-dependent. Monitor CBG more closely while on steroids.  He should follow up with PCP in 1 week, he has an appointment already scheduled for 5/7.  5.  Essential hypertension. Maintain on home amlodipine and clonidine.  Hold losartanfor now until follow up with nephrology team and maintain on torsemide to avoid volume overload given poor renal function. 6. Morbid obesity. Will need to follow-up outpatient for weight management.  Code Status:Full Family Communication:Kelly Key fully updated at bedside Disposition Plan: Home with home health Kelly Key.  Discharge Diagnoses:  Principal Problem:   Acute  respiratory failure with hypoxia (HCC) Active Problems:   HTN (hypertension)   Morbid obesity ([place])   Diabetes mellitus ([place])   Thromboembolism (HCC)   AKI (acute kidney injury) [facility])  Discharge Instructions: Discharge Instructions    Call MD for:  difficulty breathing, headache or visual disturbances   Complete by:  As directed    Call MD for:  extreme fatigue   Complete by:  As directed    Call MD for:  persistant dizziness or light-headedness   Complete by:  As directed    Call MD for:  severe uncontrolled pain   Complete by:  As directed    Diet - low sodium heart healthy   Complete by:  As directed    Increase activity slowly   Complete by:  As directed      Allergies as of 02/23/2019      Reactions   Hydralazine    Lisinopril Swelling      Medication List    STOP taking these medications   losartan 100 MG tablet Commonly known as:  COZAAR     TAKE these medications   amLODipine 10 MG tablet Commonly known as:  NORVASC Take 10 mg by mouth daily.   atorvastatin 80 MG tablet Commonly known as:  LIPITOR Take 80 mg by mouth daily.   cloNIDine 0.3 MG tablet Commonly known as:  CATAPRES Take 1 tablet (0.3 mg total) by mouth 3 (three) times daily.   ergocalciferol 1.25 MG (50000 UT) capsule Commonly known as:  VITAMIN D2 Take 50,000 Units by mouth once a week.   ezetimibe 10 MG tablet Commonly known as:  ZETIA Take 10 mg by mouth every evening.   fluticasone 50 MCG/ACT nasal spray Commonly known as:  FLONASE Place 1 spray into both nostrils daily.   insulin aspart 100 UNIT/ML injection Commonly known as:  novoLOG Inject 5-15 Units into the skin 3 (three) times daily before meals.   insulin detemir 100 UNIT/ML injection Commonly known as:  LEVEMIR Inject 0.15-0.45 mLs (15-45 Units total) into the skin 2 (two) times daily. Patient takes 15 units in the morning and 45 units in the evening What changed:  additional instructions   Insulin Pen Needle 31G  X 5 MM Misc Use as directed.   linaclotide 145 MCG Caps capsule Commonly known as:  LINZESS Take 1 capsule (145 mcg total) by mouth daily before breakfast.   potassium chloride SA 20 MEQ tablet Commonly known as:  K-DUR Take 1 tablet (20 mEq total) by mouth daily. Start taking on:  Feb 28, 2019 What changed:  These instructions start on Feb 28, 2019. If you are unsure what to do until then, ask your doctor or other care provider.   predniSONE 20 MG tablet Commonly known as:  DELTASONE Take 2 tablets (40 mg total) by mouth daily with breakfast for 5 days. Start taking on:  February 24, 2019   sodium bicarbonate 650 MG tablet Take 650 mg by mouth 2 (two) times daily.   torsemide 20 MG tablet Commonly known as:  DEMADEX Take 1 tablet (20 mg total) by  mouth 3 (three) times daily. Start taking on:  Feb 28, 2019 What changed:  These instructions start on Feb 28, 2019. If you are unsure what to do until then, ask your doctor or other care provider.      Follow-up Information    Kelly Gravel, MD Follow up.   Specialty:  Internal Medicine Why:  appointment May 7th at [facility] Contact information: [place] [address] [phone]        Fran Lowes, MD. Schedule an appointment as soon as possible for a visit in 1 week(s).   Specialty:  Nephrology Why:  Hospital Follow Up  Contact information: [address] [address] [phone]          Allergies  Allergen Reactions  . Hydralazine   . Lisinopril Swelling   Allergies as of 02/23/2019      Reactions   Hydralazine    Lisinopril Swelling      Medication List    STOP taking these medications   losartan 100 MG tablet Commonly known as:  COZAAR     TAKE these medications   amLODipine 10 MG tablet Commonly known as:  NORVASC Take 10 mg by mouth daily.   atorvastatin 80 MG tablet Commonly known as:  LIPITOR Take 80 mg by mouth daily.   cloNIDine 0.3 MG  tablet Commonly known as:  CATAPRES Take 1 tablet (0.3 mg total) by mouth 3 (three) times daily.   ergocalciferol 1.25 MG (50000 UT) capsule Commonly known as:  VITAMIN D2 Take 50,000 Units by mouth once a week.   ezetimibe 10 MG tablet Commonly known as:  ZETIA Take 10 mg by mouth every evening.   fluticasone 50 MCG/ACT nasal spray Commonly known as:  FLONASE Place 1 spray into both nostrils daily.   insulin aspart 100 UNIT/ML injection Commonly known as:  novoLOG Inject 5-15 Units into the skin 3 (three) times daily before meals.   insulin detemir 100 UNIT/ML injection Commonly known as:  LEVEMIR Inject 0.15-0.45 mLs (15-45 Units total) into the skin 2 (two) times daily. Patient takes 15 units in the morning and 45 units in the evening What changed:  additional instructions   Insulin Pen Needle 31G X 5 MM Misc Use as directed.   linaclotide 145 MCG Caps capsule Commonly known as:  LINZESS Take 1 capsule (145 mcg total) by mouth daily before breakfast.   potassium chloride SA 20 MEQ tablet Commonly known as:  K-DUR Take 1 tablet (20 mEq total) by mouth daily. Start taking on:  Feb 28, 2019 What changed:  These instructions start on Feb 28, 2019. If you are unsure what to do until then, ask your doctor or other care provider.   predniSONE 20 MG tablet Commonly known as:  DELTASONE Take 2 tablets (40 mg total) by mouth daily with breakfast for 5 days. Start taking on:  February 24, 2019   sodium bicarbonate 650 MG tablet Take 650 mg by mouth 2 (two) times daily.   torsemide 20 MG tablet Commonly known as:  DEMADEX Take 1 tablet (20 mg total) by mouth 3 (three) times daily. Start taking on:  Feb 28, 2019 What changed:  These instructions start on Feb 28, 2019. If you are unsure what to do until then, ask your doctor or other care provider.       Procedures/Studies: US Venous Img Lower Unilateral Right  Result Date: 02/21/2019 CLINICAL DATA:  Swelling since yesterday,  pain, recent  right knee arthrocentesis EXAM: RIGHT LOWER EXTREMITY VENOUS DOPPLER ULTRASOUND TECHNIQUE: Gray-scale sonography with compression, as well as color and duplex ultrasound, were performed to evaluate the deep venous system from the level of the common femoral vein through the popliteal and proximal calf veins. COMPARISON:  None FINDINGS: Normal compressibility of the common femoral, superficial femoral, and popliteal veins, as well as the proximal calf veins. No filling defects to suggest DVT on grayscale or color Doppler imaging. Doppler waveforms show normal direction of venous flow, normal respiratory phasicity and response to augmentation. Visualized segments of the saphenous venous system normal in caliber and compressibility. Survey views of the contralateral common femoral vein are unremarkable. IMPRESSION: No femoropopliteal and no calf DVT in the visualized calf veins. If clinical symptoms are inconsistent or if there are persistent or worsening symptoms, further imaging (possibly involving the iliac veins) may be warranted. Electronically Signed   By: Lucrezia Europe M.D.   On: 02/21/2019 12:33   Dg Chest Port 1 View  Result Date: 02/23/2019 CLINICAL DATA:  Acute dyspnea. EXAM: PORTABLE CHEST 1 VIEW COMPARISON:  Radiograph of February 20, 2019. FINDINGS: Stable cardiomediastinal silhouette. No pneumothorax or pleural effusion is noted. Both lungs are clear. The visualized skeletal structures are unremarkable. IMPRESSION: No active disease. Electronically Signed   By: Marijo Conception M.D.   On: 02/23/2019 07:10   Dg Chest Port 1 View  Result Date: 02/20/2019 CLINICAL DATA:  48 year old male with history of pain and swelling in both legs. EXAM: PORTABLE CHEST 1 VIEW COMPARISON:  No priors. FINDINGS: Lung volumes are normal. No consolidative airspace disease. No pleural effusions. No pneumothorax. No pulmonary nodule or mass noted. Pulmonary vasculature and the cardiomediastinal silhouette are  within normal limits. IMPRESSION: No radiographic evidence of acute cardiopulmonary disease. Electronically Signed   By: Vinnie Langton M.D.   On: 02/20/2019 09:24      Subjective: Kelly Key says that pain has been more manageable and he has been able to ambulate better, the swelling is better but not back to baseline.    Discharge Exam: Vitals:   02/23/19 0532 02/23/19 1314  BP: (!) 167/100 (!) 155/92  Pulse: 87 83  Resp: 20 20  Temp: 98 F (36.7 C) 98.1 F (36.7 C)  SpO2: 92% 92%   Vitals:   02/23/19 0337 02/23/19 0500 02/23/19 0532 02/23/19 1314  BP: (!) 193/110  (!) 167/100 (!) 155/92  Pulse: 90  87 83  Resp: (!) 22  20 20   Temp: 98 F (36.7 C)  98 F (36.7 C) 98.1 F (36.7 C)  TempSrc: Oral  Oral   SpO2: 99%  92% 92%  Weight:  (!) 147.5 kg    Height:       General: Kelly Key is alert, awake, not in acute distress Cardiovascular: normal S1/S2 +, no rubs, no gallops Respiratory: CTA bilaterally, no wheezing, no rhonchi Abdominal: Soft, NT, ND, bowel sounds + Extremities: 1+ bilateral LE edema, no cyanosis   The results of significant diagnostics from this hospitalization (including imaging, microbiology, ancillary and laboratory) are listed below for reference.     Microbiology: Recent Results (from the past 240 hour(s))  Gram stain     Status: None   Collection Time: 02/20/19  9:42 AM  Result Value Ref Range Status   Specimen Description JOINT FLUID  Final   Special Requests Immunocompromised  Final   Gram Stain   Final    CYTOSPIN SMEAR NO ORGANISMS SEEN WBC PRESENT, PREDOMINANTLY PMN Performed at [facility], [address] [place], [place] [address]    Report Status 02/20/2019 FINAL  Final  Culture, body fluid-bottle     Status: None (Preliminary result)   Collection Time: 02/20/19  9:42 AM  Result Value Ref Range Status   Specimen Description JOINT FLUID BOTTLES DRAWN AEROBIC AND ANAEROBIC  Final   Special Requests 10CC  Final   Culture   Final    NO GROWTH 3  DAYS Performed at [facility], [address], [place],  [address]    Report Status PENDING  Incomplete     Labs: BNP (last 3 results) Recent Labs    03/26/18 0645 02/20/19 0900  BNP 317.0* 017.7*   Basic Metabolic Panel: Recent Labs  Lab 02/20/19 0900 02/21/19 0453 02/21/19 2231 02/22/19 0506 02/23/19 0420  NA 135 133*  --  132* 134*  K 3.5 3.8  --  4.0 4.3  CL 97* 100  --  100 102  CO2 23 22  --  20* 19*  GLUCOSE 180* 220* 456* 278* 309*  BUN 45* 44*  --  56* 61*  CREATININE 3.98* 3.59*  --  3.65* 3.52*  CALCIUM 8.4* 7.8*  --  7.9* 8.1*  MG  --   --   --  2.0  --    Liver Function Tests: Recent Labs  Lab 02/20/19 0900  AST 15  ALT 16  ALKPHOS 97  BILITOT 0.2*  PROT 8.0  ALBUMIN 3.1*   No results for input(s): LIPASE, AMYLASE in the last 168 hours. No results for input(s): AMMONIA in the last 168 hours. CBC: Recent Labs  Lab 02/20/19 0900 02/21/19 0453 02/22/19 0506 02/23/19 0420  WBC 11.7* 10.4 11.4* 10.2  NEUTROABS 7.9*  --   --   --   HGB 11.1* 10.1* 9.3* 9.9*  HCT 35.1* 32.4* 30.0* 30.9*  MCV 78.5* 78.8* 78.1* 78.0*  PLT 350 333 349 387   Cardiac Enzymes: Recent Labs  Lab 02/20/19 0900 02/22/19 0506 02/23/19 0420  CKTOTAL  --  475* 421*  TROPONINI 0.03*  --   --    BNP: Invalid input(s): POCBNP CBG: Recent Labs  Lab 02/22/19 1144 02/22/19 1644 02/22/19 2124 02/23/19 0737 02/23/19 1126  GLUCAP 256* 253* 327* 282* 334*   D-Dimer No results for input(s): DDIMER in the last 72 hours. Hgb A1c No results for input(s): HGBA1C in the last 72 hours. Lipid Profile No results for input(s): CHOL, HDL, LDLCALC, TRIG, CHOLHDL, LDLDIRECT in the last 72 hours. Thyroid function studies No results for input(s): TSH, T4TOTAL, T3FREE, THYROIDAB in the last 72 hours.  Invalid input(s): FREET3 Anemia work up No results for input(s): VITAMINB12, FOLATE, FERRITIN, TIBC, IRON, RETICCTPCT in the last 72 hours. Urinalysis    Component  Value Date/Time   COLORURINE YELLOW 06/11/2012 0518   APPEARANCEUR CLEAR 06/11/2012 0518   LABSPEC >1.030 (H) 06/11/2012 0518   PHURINE 5.5 06/11/2012 0518   GLUCOSEU NEGATIVE 06/11/2012 0518   HGBUR NEGATIVE 06/11/2012 0518   BILIRUBINUR NEGATIVE 06/11/2012 0518   KETONESUR NEGATIVE 06/11/2012 0518   PROTEINUR 100 (A) 06/11/2012 0518   UROBILINOGEN 0.2 06/11/2012 0518   NITRITE NEGATIVE 06/11/2012 0518   LEUKOCYTESUR NEGATIVE 06/11/2012 0518   Sepsis Labs Invalid input(s): PROCALCITONIN,  WBC,  LACTICIDVEN Microbiology Recent Results (from the past 240 hour(s))  Gram stain     Status: None   Collection Time: 02/20/19  9:42 AM  Result Value Ref Range Status   Specimen Description JOINT FLUID  Final  Special Requests Immunocompromised  Final   Gram Stain   Final    CYTOSPIN SMEAR NO ORGANISMS SEEN WBC PRESENT, PREDOMINANTLY PMN Performed at [facility], [address], [place], [place] [address]    Report Status 02/20/2019 FINAL  Final  Culture, body fluid-bottle     Status: None (Preliminary result)   Collection Time: 02/20/19  9:42 AM  Result Value Ref Range Status   Specimen Description JOINT FLUID BOTTLES DRAWN AEROBIC AND ANAEROBIC  Final   Special Requests 10CC  Final   Culture   Final    NO GROWTH 3 DAYS Performed at [facility], [address], [place], [place] [address]    Report Status PENDING  Incomplete   Time coordinating discharge: 32 minutes   SIGNED:  Irwin Brakeman, MD  [facility] 02/23/2019, 3:19 PM How to contact the [facility] Attending or Consulting provider [place] or covering provider during after hours [place], for this patient?  1. Check the care team in [facility] and look for a) attending/consulting TR[facility] provider listed and b) the [facility] team listed 2. Log into [email or web address] and use [place]'s universal password to access. If you do not have the password, please contact the hospital operator. 3. Locate the [facility] provider you are looking for under  [facility] and page to a number that you can be directly reached. 4. If you still have difficulty reaching the provider, please page the [facility] (Director on Call) for the Hospitalists listed on amion for assistance.

## 2019-02-23 NOTE — Progress Notes (Signed)
Inpatient Diabetes Program Recommendations  AACE/ADA: New Consensus Statement on Inpatient Glycemic Control (2015)  Target Ranges:  Prepandial:   less than 140 mg/dL      Peak postprandial:   less than 180 mg/dL (1-2 hours)      Critically ill patients:  140 - 180 mg/dL   Lab Results  Component Value Date   GLUCAP 334 (H) 02/23/2019   HGBA1C 11.5 (H) 06/11/2012    Review of Glycemic Control Results for Kelly Key, Kelly Key (MRN [id number]) as of 02/23/2019 12:54  Ref. Range 02/22/2019 16:44 02/22/2019 21:24 02/23/2019 07:37 02/23/2019 11:26  Glucose-Capillary Latest Ref Range: 70 - 99 mg/dL 253 (H) 327 (H) 282 (H) 334 (H)   Diabetes history: Type 2 DM Outpatient Diabetes medications: Levemir 15 units QAM, 45 units QPM, Novolog 5-15 units TID Current orders for Inpatient glycemic control: Levemir 15 units BID, Novolog 10 units TID, Novolog 0-15 units TID, Novolog 0-5 units QHS Prednisone 40 mg QAM  Inpatient Diabetes Program Recommendations:    Noted last A1C from 2013, consider repeating A1C to determine glycemic control.  Consider further increasing Levemir to 20 units BID.  Of note, patient missed meal coverage this AM due to order changes, thus explaining a lunch time CBG of 334 mg/dL. However, could increase Novolog to 12 units TID (assuming that patient is consuming >50%).   Thanks, Bronson Curb, MSN, RNC-OB Diabetes Coordinator [phone] (8a-5p)

## 2019-02-23 NOTE — Progress Notes (Signed)
Physical Therapy Treatment Patient Details Name: Kelly Key MRN: [id number] DOB: Apr 20, 1971 Today's Date: 02/23/2019    History of Present Illness Kelly Key is a 48 y.o. male with medical history significant for chronic diastolic CHF with LVEF 60 to 65% in 03/2018, morbid obesity, diabetes, gout, dyslipidemia, and CKD stage IV who presented to the ED with right lower extremity swelling and pain that began approximately 2 days ago that has been gradually worsening.  He denies any prior history of blood clots and denies any chest pain or shortness of breath.  He denies fevers, or chills, but does appear to have some inspiratory pleuritic chest pain on the left side.  He denies any specific aggravating or alleviating factors and states that he has been trying to lose weight, but quickly gains it back and has been cycling with his weights a lot recently.  He sees nephrology in the outpatient setting and typically has baseline creatinine 2.93 and is noted to have CKD stage IV.  He has been recommended to initiate dialysis soon, but is reluctant to do so.    PT Comments    Patient demonstrates increased endurance/distance for gait training with less c/o pain right foot/ankle using RW. Patient able to ambulate up to 15 feet without using AD, but limited due increased pain right foot/ankle with more weightbearing.  Patient continued sitting up in chair after therapy - RN aware.  Patient will benefit from continued physical therapy in hospital and recommended venue below to increase strength, balance, endurance for safe ADLs and gait.    Follow Up Recommendations  Home health PT;Supervision for mobility/OOB;Supervision - Intermittent     Equipment Recommendations  None recommended by PT    Recommendations for Other Services       Precautions / Restrictions Precautions Precautions: Fall Restrictions Weight Bearing Restrictions: No    Mobility  Bed Mobility               General bed  mobility comments: Patient presents seated in chair  Transfers Overall transfer level: Needs assistance Equipment used: Rolling walker (2 wheeled);None Transfers: Sit to/from [facility] Sit to Stand: Supervision Stand pivot transfers: Supervision       General transfer comment: labored movement with limited weighbearing RLE  Ambulation/Gait Ambulation/Gait assistance: Supervision Gait Distance (Feet): 75 Feet Assistive device: Rolling walker (2 wheeled) Gait Pattern/deviations: Decreased step length - right;Decreased step length - left;Decreased stride length;Antalgic Gait velocity: decreased   General Gait Details: increased endurance/distance for ambulation with slightly labored slow cadence, limited weighbearing on right foot/ankle, less pain, no loss of balance   Stairs             Wheelchair Mobility    Modified Rankin (Stroke Patients Only)       Balance Overall balance assessment: Needs assistance Sitting-balance support: Feet supported;No upper extremity supported Sitting balance-Leahy Scale: Good     Standing balance support: During functional activity;No upper extremity supported Standing balance-Leahy Scale: Fair Standing balance comment: fair/good using RW                            Cognition   Behavior During Therapy: WFL for tasks assessed/performed Overall Cognitive Status: Within Functional Limits for tasks assessed  Exercises General Exercises - Lower Extremity Long Arc Quad: Seated;Strengthening;AROM;Both;10 reps Hip Flexion/Marching: Seated;Strengthening;AROM;Both;10 reps Toe Raises: Seated;Strengthening;AROM;Both;10 reps Heel Raises: Seated;AROM;Strengthening;Both;10 reps    General Comments        Pertinent Vitals/Pain Pain Assessment: Faces Faces Pain Scale: Hurts little more Pain Location: right foot/ankle Pain Descriptors / Indicators:  Aching;Sore Pain Intervention(s): Limited activity within patient's tolerance;Monitored during session    Home Living                      Prior Function            PT Goals (current goals can now be found in the care plan section) Acute Rehab PT Goals Patient Stated Goal: return home with spouse to assist PT Goal Formulation: With patient Time For Goal Achievement: 03/01/19 Potential to Achieve Goals: Good Progress towards PT goals: Progressing toward goals    Frequency    Min 4X/week      PT Plan Current plan remains appropriate    Co-evaluation              AM-PAC PT "6 Clicks" Mobility   Outcome Measure  Help needed turning from your back to your side while in a flat bed without using bedrails?: A Little Help needed moving from lying on your back to sitting on the side of a flat bed without using bedrails?: A Little Help needed moving to and from a bed to a chair (including a wheelchair)?: None Help needed standing up from a chair using your arms (e.g., wheelchair or bedside chair)?: None Help needed to walk in hospital room?: A Little Help needed climbing 3-5 steps with a railing? : A Little 6 Click Score: 20    End of Session Equipment Utilized During Treatment: Gait belt Activity Tolerance: Patient tolerated treatment well;Patient limited by fatigue Patient left: in chair;with call bell/phone within reach Nurse Communication: Mobility status PT Visit Diagnosis: Unsteadiness on feet (R26.81);Other abnormalities of gait and mobility (R26.89);Muscle weakness (generalized) (M62.81)     Time: 6010-9323 PT Time Calculation (min) (ACUTE ONLY): 32 min  Charges:  $Gait Training: 8-22 mins $Therapeutic Exercise: 8-22 mins                     12:12 PM, 02/23/19 Lonell Grandchild, MPT Physical Therapist with [facility] [phone] office [phone] mobile phone

## 2019-02-23 NOTE — Progress Notes (Signed)
Removed IV-clean, dry, intact. Reviewed d/c paperwork with patient. Reviewed medication changes and new medications. Answered all questions. Wheeled stable patient and belongings to short stay entrance where he was picked up by his wife to d/c to home.

## 2019-02-23 NOTE — Discharge Instructions (Signed)
Blood Glucose Monitoring, Adult °Monitoring your blood sugar (glucose) is an important part of managing your diabetes (diabetes mellitus). Blood glucose monitoring involves checking your blood glucose as often as directed and keeping a record (log) of your results over time. °Checking your blood glucose regularly and keeping a blood glucose log can: °· Help you and your health care provider adjust your diabetes management plan as needed, including your medicines or insulin. °· Help you understand how food, exercise, illnesses, and medicines affect your blood glucose. °· Let you know what your blood glucose is at any time. You can quickly find out if you have low blood glucose (hypoglycemia) or high blood glucose (hyperglycemia). °Your health care provider will set individualized treatment goals for you. Your goals will be based on your age, other medical conditions you have, and how you respond to diabetes treatment. Generally, the goal of treatment is to maintain the following blood glucose levels: °· Before meals (preprandial): 80-130 mg/dL (4.4-7.2 mmol/L). °· After meals (postprandial): below 180 mg/dL (10 mmol/L). °· A1c level: less than 7%. °Supplies needed: °· Blood glucose meter. °· Test strips for your meter. Each meter has its own strips. You must use the strips that came with your meter. °· A needle to prick your finger (lancet). Do not use a lancet more than one time. °· A device that holds the lancet (lancing device). °· A journal or log book to write down your results. °How to check your blood glucose ° °1. Wash your hands with soap and water. °2. Prick the side of your finger (not the tip) with the lancet. Use a different finger each time. °3. Gently rub the finger until a small drop of blood appears. °4. Follow instructions that come with your meter for inserting the test strip, applying blood to the strip, and using your blood glucose meter. °5. Write down your result and any notes. °Some meters  allow you to use areas of your body other than your finger (alternative sites) to test your blood. The most common alternative sites are: °· Forearm. °· Thigh. °· Palm of the hand. °If you think you may have hypoglycemia, or if you have a history of not knowing when your blood glucose is getting low (hypoglycemia unawareness), do not use alternative sites. Use your finger instead. Alternative sites may not be as accurate as the fingers, because blood flow is slower in these areas. This means that the result you get may be delayed, and it may be different from the result that you would get from your finger. °Follow these instructions at home: °Blood glucose log ° °· Every time you check your blood glucose, write down your result. Also write down any notes about things that may be affecting your blood glucose, such as your diet and exercise for the day. This information can help you and your health care provider: °? Look for patterns in your blood glucose over time. °? Adjust your diabetes management plan as needed. °· Check if your meter allows you to download your records to a computer. Most glucose meters store a record of glucose readings in the meter. °If you have type 1 diabetes: °· Check your blood glucose 2 or more times a day. °· Also check your blood glucose: °? Before every insulin injection. °? Before and after exercise. °? Before meals. °? 2 hours after a meal. °? Occasionally between 2:00 a.m. and 3:00 a.m., as directed. °? Before potentially dangerous tasks, like driving or using heavy machinery. °?   At bedtime.  You may need to check your blood glucose more often, up to 6-10 times a day, if you: ? Use an insulin pump. ? Need multiple daily injections (MDI). ? Have diabetes that is not well-controlled. ? Are ill. ? Have a history of severe hypoglycemia. ? Have hypoglycemia unawareness. If you have type 2 diabetes:  If you take insulin or other diabetes medicines, check your blood glucose 2 or  more times a day.  If you are on intensive insulin therapy, check your blood glucose 4 or more times a day. Occasionally, you may also need to check between 2:00 a.m. and 3:00 a.m., as directed.  Also check your blood glucose: ? Before and after exercise. ? Before potentially dangerous tasks, like driving or using heavy machinery.  You may need to check your blood glucose more often if: ? Your medicine is being adjusted. ? Your diabetes is not well-controlled. ? You are ill. General tips  Always keep your supplies with you.  If you have questions or need help, all blood glucose meters have a 24-hour "hotline" phone number that you can call. You may also contact your health care provider.  After you use a few boxes of test strips, adjust (calibrate) your blood glucose meter by following instructions that came with your meter. Contact a health care provider if:  Your blood glucose is at or above 240 mg/dL (13.3 mmol/L) for 2 days in a row.  You have been sick or have had a fever for 2 days or longer, and you are not getting better.  You have any of the following problems for more than 6 hours: ? You cannot eat or drink. ? You have nausea or vomiting. ? You have diarrhea. Get help right away if:  Your blood glucose is lower than 54 mg/dL (3 mmol/L).  You become confused or you have trouble thinking clearly.  You have difficulty breathing.  You have moderate or large ketone levels in your urine. Summary  Monitoring your blood sugar (glucose) is an important part of managing your diabetes (diabetes mellitus).  Blood glucose monitoring involves checking your blood glucose as often as directed and keeping a record (log) of your results over time.  Your health care provider will set individualized treatment goals for you. Your goals will be based on your age, other medical conditions you have, and how you respond to diabetes treatment.  Every time you check your blood glucose,  write down your result. Also write down any notes about things that may be affecting your blood glucose, such as your diet and exercise for the day. This information is not intended to replace advice given to you by your health care provider. Make sure you discuss any questions you have with your health care provider. Document Released: 10/16/2003 Document Revised: 08/24/2017 Document Reviewed: 03/24/2016 Elsevier Interactive Patient Education  2019 Elsevier Inc.   IMPORTANT INFORMATION: PAY CLOSE ATTENTION   PHYSICIAN DISCHARGE INSTRUCTIONS  Follow with Primary care provider  [name], MD  and other consultants as instructed your Hospitalist Physician  SEEK MEDICAL CARE OR RETURN TO EMERGENCY ROOM IF SYMPTOMS COME BACK, WORSEN OR NEW PROBLEM DEVELOPS.   Please note: You were cared for by a hospitalist during your hospital stay. Every effort will be made to forward records to your primary care provider.  You can request that your primary care provider send for your hospital records if they have not received them.  Once you are discharged, your primary care  physician will handle any further medical issues. Please note that NO REFILLS for any discharge medications will be authorized once you are discharged, as it is imperative that you return to your primary care physician (or establish a relationship with a primary care physician if you do not have one) for your post hospital discharge needs so that they can reassess your need for medications and monitor your lab values.  Please get a complete blood count and chemistry panel checked by your Primary MD at your next visit, and again as instructed by your Primary MD.  Get Medicines reviewed and adjusted: Please take all your medications with you for your next visit with your Primary MD  Laboratory/radiological data: Please request your Primary MD to go over all hospital tests and procedure/radiological results at the follow up, please ask your  primary care provider to get all Hospital records sent to his/her office.  In some cases, they will be blood work, cultures and biopsy results pending at the time of your discharge. Please request that your primary care provider follow up on these results.  If you are diabetic, please bring your blood sugar readings with you to your follow up appointment with primary care.    Please call and make your follow up appointments as soon as possible.    Also Note the following: If you experience worsening of your admission symptoms, develop shortness of breath, life threatening emergency, suicidal or homicidal thoughts you must seek medical attention immediately by calling 911 or calling your MD immediately  if symptoms less severe.  You must read complete instructions/literature along with all the possible adverse reactions/side effects for all the Medicines you take and that have been prescribed to you. Take any new Medicines after you have completely understood and accpet all the possible adverse reactions/side effects.   Do not drive when taking Pain medications or sleeping medications (Benzodiazepines)  Do not take more than prescribed Pain, Sleep and Anxiety Medications. It is not advisable to combine anxiety,sleep and pain medications without talking with your primary care practitioner  Special Instructions: If you have smoked or chewed Tobacco  in the last 2 yrs please stop smoking, stop any regular Alcohol  and or any Recreational drug use.  Wear Seat belts while driving.

## 2019-02-25 ENCOUNTER — Other Ambulatory Visit: Payer: Self-pay

## 2019-02-25 NOTE — Patient Outreach (Signed)
[facility]) Care Management  02/25/2019  NUNZIO BANET September 07, 1971 [id number]    Received call today from Tresea Mall from [facility] regarding the patient.  She was able to verify Two patient identifiers.  She states that they have been trying to get in touch with the patient since leaving the hospital with no luck.  She asked if I may have another means to get in touch with the patient. Advised her that I do not but offered to call the patient in hopes that if he gets my message and calls me back I can give him the information.  The patient did not answer the phone but I left a message for him to give me call.  Lazaro Arms RN, BSN, [place] Direct Dial:  [phone]  Fax: [phone]

## 2019-02-26 LAB — CULTURE, BODY FLUID W GRAM STAIN -BOTTLE: Culture: NO GROWTH

## 2019-03-24 DIAGNOSIS — E1022 Type 1 diabetes mellitus with diabetic chronic kidney disease: Secondary | ICD-10-CM | POA: Diagnosis not present

## 2019-03-24 DIAGNOSIS — R0602 Shortness of breath: Secondary | ICD-10-CM | POA: Diagnosis not present

## 2019-03-24 DIAGNOSIS — E1065 Type 1 diabetes mellitus with hyperglycemia: Secondary | ICD-10-CM | POA: Diagnosis not present

## 2019-03-24 DIAGNOSIS — Z9119 Patient's noncompliance with other medical treatment and regimen: Secondary | ICD-10-CM | POA: Diagnosis not present

## 2019-03-24 DIAGNOSIS — I509 Heart failure, unspecified: Secondary | ICD-10-CM | POA: Diagnosis not present

## 2019-03-24 DIAGNOSIS — N183 Chronic kidney disease, stage 3 (moderate): Secondary | ICD-10-CM | POA: Diagnosis not present

## 2019-03-24 DIAGNOSIS — Z794 Long term (current) use of insulin: Secondary | ICD-10-CM | POA: Diagnosis not present

## 2019-03-25 ENCOUNTER — Ambulatory Visit: Payer: Medicare Other

## 2019-04-06 DIAGNOSIS — N184 Chronic kidney disease, stage 4 (severe): Secondary | ICD-10-CM | POA: Diagnosis not present

## 2019-04-06 DIAGNOSIS — R0601 Orthopnea: Secondary | ICD-10-CM | POA: Diagnosis not present

## 2019-04-06 DIAGNOSIS — I129 Hypertensive chronic kidney disease with stage 1 through stage 4 chronic kidney disease, or unspecified chronic kidney disease: Secondary | ICD-10-CM | POA: Diagnosis not present

## 2019-04-06 DIAGNOSIS — E785 Hyperlipidemia, unspecified: Secondary | ICD-10-CM | POA: Diagnosis not present

## 2019-04-06 DIAGNOSIS — I509 Heart failure, unspecified: Secondary | ICD-10-CM | POA: Diagnosis not present

## 2019-04-06 DIAGNOSIS — Z794 Long term (current) use of insulin: Secondary | ICD-10-CM | POA: Diagnosis not present

## 2019-04-06 DIAGNOSIS — Z79899 Other long term (current) drug therapy: Secondary | ICD-10-CM | POA: Diagnosis not present

## 2019-04-06 DIAGNOSIS — E1122 Type 2 diabetes mellitus with diabetic chronic kidney disease: Secondary | ICD-10-CM | POA: Diagnosis not present

## 2019-04-07 DIAGNOSIS — I509 Heart failure, unspecified: Secondary | ICD-10-CM | POA: Diagnosis not present

## 2019-04-07 DIAGNOSIS — R0601 Orthopnea: Secondary | ICD-10-CM | POA: Diagnosis not present

## 2019-04-07 DIAGNOSIS — E785 Hyperlipidemia, unspecified: Secondary | ICD-10-CM | POA: Diagnosis not present

## 2019-04-07 DIAGNOSIS — E1122 Type 2 diabetes mellitus with diabetic chronic kidney disease: Secondary | ICD-10-CM | POA: Diagnosis not present

## 2019-04-07 DIAGNOSIS — I129 Hypertensive chronic kidney disease with stage 1 through stage 4 chronic kidney disease, or unspecified chronic kidney disease: Secondary | ICD-10-CM | POA: Diagnosis not present

## 2019-04-13 DIAGNOSIS — I129 Hypertensive chronic kidney disease with stage 1 through stage 4 chronic kidney disease, or unspecified chronic kidney disease: Secondary | ICD-10-CM | POA: Diagnosis not present

## 2019-04-13 DIAGNOSIS — E1165 Type 2 diabetes mellitus with hyperglycemia: Secondary | ICD-10-CM | POA: Diagnosis not present

## 2019-04-13 DIAGNOSIS — E785 Hyperlipidemia, unspecified: Secondary | ICD-10-CM | POA: Diagnosis not present

## 2019-04-13 DIAGNOSIS — N184 Chronic kidney disease, stage 4 (severe): Secondary | ICD-10-CM | POA: Diagnosis not present

## 2019-04-13 DIAGNOSIS — Z794 Long term (current) use of insulin: Secondary | ICD-10-CM | POA: Diagnosis not present

## 2019-04-13 DIAGNOSIS — I509 Heart failure, unspecified: Secondary | ICD-10-CM | POA: Diagnosis not present

## 2019-04-13 DIAGNOSIS — Z6841 Body Mass Index (BMI) 40.0 and over, adult: Secondary | ICD-10-CM | POA: Diagnosis not present

## 2019-04-13 DIAGNOSIS — E1122 Type 2 diabetes mellitus with diabetic chronic kidney disease: Secondary | ICD-10-CM | POA: Diagnosis not present

## 2019-04-13 DIAGNOSIS — Z79899 Other long term (current) drug therapy: Secondary | ICD-10-CM | POA: Diagnosis not present

## 2019-04-18 ENCOUNTER — Encounter: Payer: Self-pay | Admitting: *Deleted

## 2019-04-18 ENCOUNTER — Ambulatory Visit (INDEPENDENT_AMBULATORY_CARE_PROVIDER_SITE_OTHER): Payer: Medicare Other | Admitting: Cardiology

## 2019-04-18 VITALS — BP 164/100 | HR 94 | Ht 69.0 in | Wt 331.6 lb

## 2019-04-18 DIAGNOSIS — N184 Chronic kidney disease, stage 4 (severe): Secondary | ICD-10-CM | POA: Diagnosis not present

## 2019-04-18 DIAGNOSIS — I5033 Acute on chronic diastolic (congestive) heart failure: Secondary | ICD-10-CM

## 2019-04-18 MED ORDER — TORSEMIDE 20 MG PO TABS: ORAL_TABLET | ORAL | 1 refills | Status: DC

## 2019-04-18 NOTE — Patient Instructions (Addendum)
Your physician recommends that you schedule a follow-up appointment in: [place] has recommended you make the following change in your medication:   INCREASE TORSEMIDE 60 MG (3 TABLETS) IN THE MORNING AND 40 MG (2 TABLETS) IN THE EVENING   Your physician recommends that you return for lab work IN 2 WEEKS BMP/MG  PLEASE CHECK WEIGHT DAILY FOR 1 WEEK AND CALL   Thank you for choosing [facility]!!

## 2019-04-18 NOTE — Progress Notes (Signed)
Clinical Summary Kelly Key is a 48 y.o.male seen today as a new consult, referred by Dr Maudie Mercury for diastolic heart failure   1. Chronic diastolic heart failure - 02/6432 echo: LVEF 60-65%, no WMAs, indeterminate diastolic function  - discharge weight 4/29 was 147.5 kg (325 lbs)   - recent increased swelling on both side, abdominal distension. +orthopnea, sleeps in recliner. +SOB/DOE just in from parking lot into office - compliant with meds, torsemide 40mg  in AM and 20mg  in PM - home weight 295-300 lbs, this appears to be inaccurate based on todays weight of 331 lbs.  - limiting sodium intake  2. CKD IV - Baseline creatinine noted to be 2.93 with GFR of 28 on recent nephrology visit on 12/2018. - followed by Dr Hinda Key though has not seen in some time.     Past Medical History:  Diagnosis Date  . Acute diastolic CHF (congestive heart failure) ([place]) 06/11/2012   EF 50-55% [facility])  . Diabetes mellitus    A1c 11.5 (06/11/2012).  . Gout   . Hepatic steatosis 06/11/2012   Elevated LFTs  . Hyperlipemia   . Malignant hypertension   . Microcytic anemia 06/12/2012  . Obesity      Allergies  Allergen Reactions  . Hydralazine   . Lisinopril Swelling     Current Outpatient Medications  Medication Sig Dispense Refill  . amLODipine (NORVASC) 10 MG tablet Take 10 mg by mouth daily.    [name] atorvastatin (LIPITOR) 80 MG tablet Take 80 mg by mouth daily.    . cloNIDine (CATAPRES) 0.3 MG tablet Take 1 tablet (0.3 mg total) by mouth 3 (three) times daily. 90 tablet 0  . ergocalciferol (VITAMIN D2) 1.25 MG (50000 UT) capsule Take 50,000 Units by mouth once a week.    . ezetimibe (ZETIA) 10 MG tablet Take 10 mg by mouth every evening.    . fluticasone (FLONASE) 50 MCG/ACT nasal spray Place 1 spray into both nostrils daily. 1 g 0  . insulin aspart (NOVOLOG) 100 UNIT/ML injection Inject 5-15 Units into the skin 3 (three) times daily before meals.     . insulin detemir (LEVEMIR)  100 UNIT/ML injection Inject 0.15-0.45 mLs (15-45 Units total) into the skin 2 (two) times daily. Patient takes 15 units in the morning and 45 units in the evening 10 mL 11  . Insulin Pen Needle 31G X 5 MM MISC Use as directed. 100 each 0  . linaclotide (LINZESS) 145 MCG CAPS capsule Take 1 capsule (145 mcg total) by mouth daily before breakfast. 30 capsule 0  . potassium chloride SA (K-DUR) 20 MEQ tablet Take 1 tablet (20 mEq total) by mouth daily.    . sodium bicarbonate 650 MG tablet Take 650 mg by mouth 2 (two) times daily.    [name] torsemide (DEMADEX) 20 MG tablet Take 1 tablet (20 mg total) by mouth 3 (three) times daily.     No current facility-administered medications for this visit.      No past surgical history on file.   Allergies  Allergen Reactions  . Hydralazine   . Lisinopril Swelling      No family history on file.   Social History Kelly Key reports that he has never smoked. He has never used smokeless tobacco. Kelly Key reports no history of alcohol use.   Review of Systems CONSTITUTIONAL: No weight loss, fever, chills, weakness or fatigue.  HEENT: Eyes: No visual loss, blurred vision, double vision or yellow sclerae.No hearing loss,  sneezing, congestion, runny nose or sore throat.  SKIN: No rash or itching.  CARDIOVASCULAR: per hpi RESPIRATORY: per hpi  GASTROINTESTINAL: No anorexia, nausea, vomiting or diarrhea. No abdominal pain or blood.  GENITOURINARY: No burning on urination, no polyuria NEUROLOGICAL: No headache, dizziness, syncope, paralysis, ataxia, numbness or tingling in the extremities. No change in bowel or bladder control.  MUSCULOSKELETAL: No muscle, back pain, joint pain or stiffness.  LYMPHATICS: No enlarged nodes. No history of splenectomy.  PSYCHIATRIC: No history of depression or anxiety.  ENDOCRINOLOGIC: No reports of sweating, cold or heat intolerance. No polyuria or polydipsia.  [name]   Physical Examination Today's Vitals   04/18/19 0849  04/18/19 0854  BP: (!) 175/117 (!) 164/100  Pulse: 96 94  SpO2: 98% 99%  Weight: (!) 331 lb 9.6 oz (150.4 kg)   Height: 5\' 9"  (1.753 m)    Body mass index is 48.97 kg/m.  Gen: resting comfortably, no acute distress HEENT: no scleral icterus, pupils equal round and reactive, no palptable cervical adenopathy,  CV: RRR, no m/r/g, no jvd Resp: Clear to auscultation bilaterally GI: abdomen is soft, non-tender, non-distended, normal bowel sounds, no hepatosplenomegaly MSK:2+ bilateral LE edema Skin: warm, no rash Neuro:  no focal deficits Psych: appropriate affect   Diagnostic Studies     Assessment and Plan  1. Acute on chronic diastolic HF - evidence of fluid overload by exam, significant edema with SOB/DOE and orthopnea - currenly on torsemide 40mg  in AM and 20mg  in PM, we will change to 60mg  in Am and 40mg  in PM, check labs in 2 weeks, he will call us to update [place] on weights in 1 week. He will check his scale at home as it appears inaccurate, he is willing to buy a new scale  2. CKD IV - repeat labs in 2 weeks - continue to follow with neprhology - close monitoring of renal function with diuresis  3. HTN - elevated today in setting of volume overload, follow with diuresis    F/u 3 weeks   Kelly Key, M.D., F.A.C.C.

## 2019-04-21 DIAGNOSIS — I509 Heart failure, unspecified: Secondary | ICD-10-CM | POA: Diagnosis not present

## 2019-04-21 DIAGNOSIS — Z79899 Other long term (current) drug therapy: Secondary | ICD-10-CM | POA: Diagnosis not present

## 2019-04-21 DIAGNOSIS — I10 Essential (primary) hypertension: Secondary | ICD-10-CM | POA: Diagnosis not present

## 2019-04-27 ENCOUNTER — Telehealth: Payer: Self-pay | Admitting: Cardiology

## 2019-04-27 DIAGNOSIS — I5031 Acute diastolic (congestive) heart failure: Secondary | ICD-10-CM | POA: Diagnosis not present

## 2019-04-27 NOTE — Telephone Encounter (Signed)
Pt was not able to get new scale - says she will try to get pt to lab [facility] today and will call us to come by and weigh today after lab work

## 2019-04-27 NOTE — Telephone Encounter (Signed)
Pt came by office to weigh 321lbs - just had labs done at [facility] - will f/u on results tomorrow

## 2019-04-27 NOTE — Telephone Encounter (Signed)
Pt c/o swelling all over and cramping in both legs - also has some SOB on exertion - says he can't lay down to sleep - has been taking potassium 20 meq  And torsemide 60 mg in the morning 40 mg in the evening - says weight is at 300lbs last they checked

## 2019-04-27 NOTE — Telephone Encounter (Signed)
Did he buy a new scale. His numbers by there scale at home was 20 to 30 lbs different than ours. I doubt he would have lost 30 lbs of fluid in a little over a week. He needs to go get his blood work today, his potassium and magnesium may be too low. Please update me once we get more info on the scale. Can he come for a weight check here when he gets his labs?   Zandra Abts MD

## 2019-04-27 NOTE — Telephone Encounter (Signed)
Lorriane Shire called stating that patient is still having cramps, tingling and burning in his feet. [phone]

## 2019-04-28 ENCOUNTER — Emergency Department (HOSPITAL_COMMUNITY)
Admission: EM | Admit: 2019-04-28 | Discharge: 2019-04-28 | Disposition: A | Discharge status: Home / Self Care | Payer: Medicare Other | Attending: Emergency Medicine | Admitting: Emergency Medicine

## 2019-04-28 ENCOUNTER — Emergency Department (HOSPITAL_COMMUNITY): Payer: Medicare Other

## 2019-04-28 ENCOUNTER — Other Ambulatory Visit: Payer: Self-pay

## 2019-04-28 ENCOUNTER — Encounter (HOSPITAL_COMMUNITY): Payer: Self-pay | Admitting: Emergency Medicine

## 2019-04-28 ENCOUNTER — Encounter: Payer: Self-pay | Admitting: *Deleted

## 2019-04-28 DIAGNOSIS — Z794 Long term (current) use of insulin: Secondary | ICD-10-CM | POA: Diagnosis not present

## 2019-04-28 DIAGNOSIS — M10061 Idiopathic gout, right knee: Secondary | ICD-10-CM | POA: Diagnosis not present

## 2019-04-28 DIAGNOSIS — Z79899 Other long term (current) drug therapy: Secondary | ICD-10-CM | POA: Insufficient documentation

## 2019-04-28 DIAGNOSIS — E119 Type 2 diabetes mellitus without complications: Secondary | ICD-10-CM | POA: Insufficient documentation

## 2019-04-28 DIAGNOSIS — M25861 Other specified joint disorders, right knee: Secondary | ICD-10-CM | POA: Diagnosis not present

## 2019-04-28 DIAGNOSIS — Z8679 Personal history of other diseases of the circulatory system: Secondary | ICD-10-CM

## 2019-04-28 DIAGNOSIS — R600 Localized edema: Secondary | ICD-10-CM | POA: Diagnosis not present

## 2019-04-28 DIAGNOSIS — R2241 Localized swelling, mass and lump, right lower limb: Secondary | ICD-10-CM | POA: Diagnosis not present

## 2019-04-28 DIAGNOSIS — M109 Gout, unspecified: Secondary | ICD-10-CM | POA: Diagnosis not present

## 2019-04-28 DIAGNOSIS — I5032 Chronic diastolic (congestive) heart failure: Secondary | ICD-10-CM | POA: Insufficient documentation

## 2019-04-28 DIAGNOSIS — I110 Hypertensive heart disease with heart failure: Secondary | ICD-10-CM | POA: Diagnosis not present

## 2019-04-28 DIAGNOSIS — R0602 Shortness of breath: Secondary | ICD-10-CM | POA: Diagnosis not present

## 2019-04-28 DIAGNOSIS — M79604 Pain in right leg: Secondary | ICD-10-CM | POA: Diagnosis not present

## 2019-04-28 DIAGNOSIS — R000 Tachycardia, unspecified: Secondary | ICD-10-CM | POA: Diagnosis not present

## 2019-04-28 LAB — BASIC METABOLIC PANEL
Anion gap: 18 — ABNORMAL HIGH (ref 5–15)
BUN: 57 mg/dL — ABNORMAL HIGH (ref 6–20)
CO2: 23 mmol/L (ref 22–32)
Calcium: 7.6 mg/dL — ABNORMAL LOW (ref 8.9–10.3)
Chloride: 93 mmol/L — ABNORMAL LOW (ref 98–111)
Creatinine, Ser: 4.93 mg/dL — ABNORMAL HIGH (ref 0.61–1.24)
GFR calc Af Amer: 15 mL/min — ABNORMAL LOW (ref >60)
GFR calc non Af Amer: 13 mL/min — ABNORMAL LOW (ref >60)
Glucose, Bld: 262 mg/dL — ABNORMAL HIGH (ref 70–99)
Potassium: 3.3 mmol/L — ABNORMAL LOW (ref 3.5–5.1)
Sodium: 134 mmol/L — ABNORMAL LOW (ref 135–145)

## 2019-04-28 LAB — CBC
HCT: 35.5 % — ABNORMAL LOW (ref 39.0–52.0)
Hemoglobin: 11 g/dL — ABNORMAL LOW (ref 13.0–17.0)
MCH: 23.9 pg — ABNORMAL LOW (ref 26.0–34.0)
MCHC: 31 g/dL (ref 30.0–36.0)
MCV: 77.2 fL — ABNORMAL LOW (ref 80.0–100.0)
Platelets: 354 10*3/uL (ref 150–400)
RBC: 4.6 MIL/uL (ref 4.22–5.81)
RDW: 15.4 % (ref 11.5–15.5)
WBC: 8.9 10*3/uL (ref 4.0–10.5)
nRBC: 0 % (ref 0.0–0.2)

## 2019-04-28 LAB — BRAIN NATRIURETIC PEPTIDE: B Natriuretic Peptide: 85 pg/mL (ref 0.0–100.0)

## 2019-04-28 MED ORDER — ONDANSETRON HCL 4 MG/2ML IJ SOLN
4.0000 mg | Freq: Once | INTRAMUSCULAR | Status: AC
  Administered 2019-04-28: 11:00:00 4 mg via INTRAVENOUS
  Filled 2019-04-28: qty 2

## 2019-04-28 MED ORDER — PREDNISONE 50 MG PO TABS
60.0000 mg | ORAL_TABLET | Freq: Once | ORAL | Status: AC
  Administered 2019-04-28: 60 mg via ORAL
  Filled 2019-04-28: qty 1

## 2019-04-28 MED ORDER — PREDNISONE 10 MG PO TABS: 40.0000 mg | ORAL_TABLET | Freq: Every day | ORAL | 0 refills | Status: DC

## 2019-04-28 MED ORDER — HYDROCODONE-ACETAMINOPHEN 5-325 MG PO TABS: 1.0000 | ORAL_TABLET | Freq: Four times a day (QID) | ORAL | 0 refills | Status: DC | PRN

## 2019-04-28 MED ORDER — HYDROMORPHONE HCL 1 MG/ML IJ SOLN
1.0000 mg | Freq: Once | INTRAMUSCULAR | Status: AC
  Administered 2019-04-28: 1 mg via INTRAVENOUS
  Filled 2019-04-28: qty 1

## 2019-04-28 NOTE — Discharge Instructions (Signed)
Take the prednisone as directed.  Take the pain medicine as directed.  Follow back up with Dr. Maudie Mercury.  No concerns for any fluid in the lungs today.  Will need to have your kidney function followed up again.  X-ray of the right knee was consistent with arthritis but with your history of gout this is most likely gouty arthritis.

## 2019-04-28 NOTE — Telephone Encounter (Signed)
Received labs and will forward to provider - pt is currently at [facility] ED as of this morning

## 2019-04-28 NOTE — ED Notes (Signed)
EDP at bedside updating patient. 

## 2019-04-28 NOTE — ED Triage Notes (Addendum)
PT c/o lower extremity edema worsening over the past week. PT states he seen his cardiologist yesterday and had blood work done and recently had his fluid medication increased with no relief. PT c/o right knee pain and states that's the main reason for my visit and also SOB noted with exertion.

## 2019-04-28 NOTE — ED Provider Notes (Signed)
[facility] EMERGENCY DEPARTMENT Provider Note   CSN: [id number] Arrival date & time: 04/28/19  9179     History   Chief Complaint Chief Complaint  Patient presents with  . Shortness of Breath    HPI Kelly Key is a 48 y.o. male.     Patient has a history of acute diastolic congestive heart failure.  Patient saw his cardiologist yesterday.  In [place].  He was seen by Dr. Harl Bowie.  Patient was noted to have bilateral leg swelling.  Patient presenting today with increased swelling to the right knee and right leg area.  And a lot of pain in the right knee.  Patient's had a history of gout in the past and this reminds him of that.  Patient without any increased shortness of breath.  Denies chest pain.  Patient is currently on colchicine started by his primary care doctor Dr. Maudie Mercury     Past Medical History:  Diagnosis Date  . Acute diastolic CHF (congestive heart failure) ([place]) 06/11/2012   EF 50-55% [facility])  . Diabetes mellitus    A1c 11.5 (06/11/2012).  . Gout   . Hepatic steatosis 06/11/2012   Elevated LFTs  . Hyperlipemia   . Malignant hypertension   . Microcytic anemia 06/12/2012  . Obesity     Patient Active Problem List   Diagnosis Date Noted  . Thromboembolism ([place]) 02/20/2019  . AKI (acute kidney injury) ([place]) 02/20/2019  . CHF exacerbation ([place]) 03/26/2018  . Acute on chronic diastolic CHF (congestive heart failure) ([place]) 03/26/2018  . Acute respiratory failure with hypoxia ([place]) 03/26/2018  . Diabetes mellitus ([place]) 06/12/2012  . Hyponatremia 06/12/2012  . Microcytic anemia 06/12/2012  . Acute diastolic CHF (congestive heart failure) ([place]) 06/11/2012  . HTN (hypertension) 06/11/2012  . Elevated LFTs 06/11/2012  . Morbid obesity ([place]) 06/11/2012  . Hepatic steatosis 06/11/2012    Past Surgical History:  Procedure Laterality Date  . NO PAST SURGERIES          Home Medications    Prior to Admission medications   Medication Sig Start Date End  Date Taking? Authorizing Provider  albuterol (VENTOLIN HFA) 108 (90 Base) MCG/ACT inhaler Inhale 2 puffs into the lungs every 4 (four) hours as needed. 03/24/19  Yes [provider]  amLODipine (NORVASC) 10 MG tablet Take 10 mg by mouth daily.   Yes [provider]  cloNIDine (CATAPRES) 0.3 MG tablet Take 1 tablet (0.3 mg total) by mouth 3 (three) times daily. 04/04/18 04/28/19 Yes Shah, Pratik D, DO  COLCRYS 0.6 MG tablet Take 0.6 mg by mouth 2 (two) times daily. 03/22/19  Yes [provider]  ergocalciferol (VITAMIN D2) 1.25 MG (50000 UT) capsule Take 50,000 Units by mouth once a week.   Yes [provider]  ezetimibe (ZETIA) 10 MG tablet Take 10 mg by mouth every evening. 01/20/19  Yes [provider]  fluticasone (FLONASE) 50 MCG/ACT nasal spray Place 1 spray into both nostrils daily. 04/04/18 04/28/19 Yes Shah, Pratik D, DO  insulin aspart (NOVOLOG) 100 UNIT/ML injection Inject 5-15 Units into the skin 3 (three) times daily before meals.    Yes [provider]  insulin detemir (LEVEMIR) 100 UNIT/ML injection Inject 0.15-0.45 mLs (15-45 Units total) into the skin 2 (two) times daily. Patient takes 15 units in the morning and 45 units in the evening 02/23/19  Yes Johnson, Clanford L, MD  Insulin Pen Needle 31G X 5 MM MISC Use as directed. 04/04/18  Yes Manuella Ghazi,  Pratik D, DO  labetalol (NORMODYNE) 200 MG tablet Take 200 mg by mouth 2 (two) times daily.   Yes [provider]  linaclotide Rolan Lipa) 145 MCG CAPS capsule Take 1 capsule (145 mcg total) by mouth daily before breakfast. 04/05/18  Yes Manuella Ghazi, Pratik D, DO  potassium chloride (MICRO-K) 10 MEQ CR capsule Take 10 mEq by mouth daily. 04/21/19  Yes [provider]  potassium chloride SA (K-DUR) 20 MEQ tablet Take 1 tablet (20 mEq total) by mouth daily. 02/28/19  Yes Johnson, Clanford L, MD  sodium bicarbonate 650 MG tablet Take 650 mg by mouth 2 (two) times daily.   Yes [provider]   torsemide (DEMADEX) 20 MG tablet TAKE 60 MG IN THE MORNING AND 40 MG IN THE EVENING Patient taking differently: Take 20 mg by mouth 3 (three) times daily.  04/18/19  Yes BranchAlphonse Guild, MD  HYDROcodone-acetaminophen (NORCO/VICODIN) 5-325 MG tablet Take 1 tablet by mouth every 6 (six) hours as needed. 04/28/19   Fredia Sorrow, MD  predniSONE (DELTASONE) 10 MG tablet Take 4 tablets (40 mg total) by mouth daily. 04/28/19   Fredia Sorrow, MD    Family History Family History  Problem Relation Age of Onset  . Gout Mother   . Asthma Mother   . Diabetes Father   . Heart failure Father   . Diabetes Sister   . Hypertension Brother   . Pancreatic cancer Brother   . Diabetes Sister     Social History Social History   Tobacco Use  . Smoking status: Never Smoker  . Smokeless tobacco: Never Used  Substance Use Topics  . Alcohol use: No    Comment: haven't drank in over 5 years   . Drug use: No     Allergies   Hydralazine and Lisinopril   Review of Systems Review of Systems  Constitutional: Negative for chills and fever.  HENT: Negative for congestion, rhinorrhea and sore throat.   Eyes: Negative for visual disturbance.  Respiratory: Negative for cough and shortness of breath.   Cardiovascular: Positive for leg swelling. Negative for chest pain.  Gastrointestinal: Negative for abdominal pain, diarrhea, nausea and vomiting.  Genitourinary: Negative for dysuria.  Musculoskeletal: Positive for arthralgias. Negative for back pain and neck pain.  Skin: Negative for rash.  Neurological: Negative for dizziness, light-headedness and headaches.  Hematological: Does not bruise/bleed easily.  Psychiatric/Behavioral: Negative for confusion.     Physical Exam Updated Vital Signs BP (!) 188/115   Pulse (!) 103   Temp 98.6 F (37 C) (Oral)   Resp 14   Ht 1.702 m (5\' 7" )   Wt (!) 145.2 kg   SpO2 98%   BMI 50.12 kg/m   Physical Exam Vitals signs and nursing note reviewed.   Constitutional:      Appearance: Normal appearance. He is well-developed.  HENT:     Head: Normocephalic and atraumatic.  Eyes:     Extraocular Movements: Extraocular movements intact.     Conjunctiva/sclera: Conjunctivae normal.     Pupils: Pupils are equal, round, and reactive to light.  Neck:     Musculoskeletal: Normal range of motion and neck supple.  Cardiovascular:     Rate and Rhythm: Normal rate and regular rhythm.     Heart sounds: No murmur.  Pulmonary:     Effort: Pulmonary effort is normal. No respiratory distress.     Breath sounds: Normal breath sounds.  Abdominal:     Palpations: Abdomen is soft.  Tenderness: There is no abdominal tenderness.  Musculoskeletal:        General: Swelling and tenderness present.     Comments: Patient with bilateral leg swelling right much greater than left.  Lot of increased warmth and swelling around the right knee.  No swelling to the left knee.  Good cap refill distally.  Skin:    General: Skin is warm and dry.     Capillary Refill: Capillary refill takes less than 2 seconds.  Neurological:     General: No focal deficit present.     Mental Status: He is alert and oriented to person, place, and time.      ED Treatments / Results  Labs (all labs ordered are listed, but only abnormal results are displayed) Labs Reviewed  CBC - Abnormal; Notable for the following components:      Result Value   Hemoglobin 11.0 (*)    HCT 35.5 (*)    MCV 77.2 (*)    MCH 23.9 (*)    All other components within normal limits  BASIC METABOLIC PANEL - Abnormal; Notable for the following components:   Sodium 134 (*)    Potassium 3.3 (*)    Chloride 93 (*)    Glucose, Bld 262 (*)    BUN 57 (*)    Creatinine, Ser 4.93 (*)    Calcium 7.6 (*)    GFR calc non Af Amer 13 (*)    GFR calc Af Amer 15 (*)    Anion gap 18 (*)    All other components within normal limits  BRAIN NATRIURETIC PEPTIDE    EKG EKG Interpretation  Date/Time:   Thursday April 28 2019 08:16:55 EDT Ventricular Rate:  106 PR Interval:    QRS Duration: 109 QT Interval:  371 QTC Calculation: 493 R Axis:   -83 Text Interpretation:  Sinus tachycardia Left anterior fascicular block Probable anteroseptal infarct, old Minimal ST elevation, lateral leads Artifact Confirmed by Fredia Sorrow [phone]) on 04/28/2019 8:20:45 AM   Radiology Dg Chest 1 View  Result Date: 04/28/2019 CLINICAL DATA:  Increased lower extremity swelling and shortness of breath during the past week. History of CHF. EXAM: CHEST  1 VIEW COMPARISON:  02/23/2019; 02/20/2019; 03/31/2018 FINDINGS: Grossly unchanged enlarged cardiac silhouette and mediastinal contours with atherosclerotic plaque within the thoracic aorta. There is persistent mild elevation/eventration of the right hemidiaphragm. Pulmonary venous congestion without frank evidence of edema. No focal airspace opacities. No pleural effusion or pneumothorax. No acute osseous abnormalities. IMPRESSION: Similar findings of cardiomegaly and pulmonary venous congestion without superimposed acute cardiopulmonary disease. Specifically, no definite evidence of pulmonary edema on this AP portable examination. Electronically Signed   By: Sandi Mariscal M.D.   On: 04/28/2019 10:41   US Venous Img Lower Right (dvt Study)  Result Date: 04/28/2019 CLINICAL DATA:  Right lower extremity pain and edema for the past week. Evaluate for DVT. EXAM: RIGHT LOWER EXTREMITY VENOUS DOPPLER ULTRASOUND TECHNIQUE: Gray-scale sonography with graded compression, as well as color Doppler and duplex ultrasound were performed to evaluate the lower extremity deep venous systems from the level of the common femoral vein and including the common femoral, femoral, profunda femoral, popliteal and calf veins including the posterior tibial, peroneal and gastrocnemius veins when visible. The superficial great saphenous vein was also interrogated. Spectral Doppler was utilized to evaluate  flow at rest and with distal augmentation maneuvers in the common femoral, femoral and popliteal veins. COMPARISON:  None. FINDINGS: Contralateral Common Femoral Vein: Respiratory phasicity is normal  and symmetric with the symptomatic side. No evidence of thrombus. Normal compressibility. Common Femoral Vein: No evidence of thrombus. Normal compressibility, respiratory phasicity and response to augmentation. Saphenofemoral Junction: No evidence of thrombus. Normal compressibility and flow on color Doppler imaging. Profunda Femoral Vein: No evidence of thrombus. Normal compressibility and flow on color Doppler imaging. Femoral Vein: No evidence of thrombus. Normal compressibility, respiratory phasicity and response to augmentation. Popliteal Vein: No evidence of thrombus. Normal compressibility, respiratory phasicity and response to augmentation. Calf Veins: No evidence of thrombus. Normal compressibility and flow on color Doppler imaging. Superficial Great Saphenous Vein: No evidence of thrombus. Normal compressibility. Venous Reflux:  None. Other Findings: Subcutaneous edema is noted at the level of the right lower leg. IMPRESSION: No evidence of DVT within the right lower extremity. Electronically Signed   By: Sandi Mariscal M.D.   On: 04/28/2019 10:39   Dg Knee Complete 4 Views Right  Result Date: 04/28/2019 CLINICAL DATA:  Severe knee pain and swelling.  No known injury. EXAM: RIGHT KNEE - COMPLETE 4+ VIEW COMPARISON:  10/05/2016 FINDINGS: No fracture or dislocation. Mild-to-moderate tricompartmental degenerative change of the knee, worse within the medial compartment with joint space loss, subchondral sclerosis and osteophytosis. There is minimal spurring of the tibial spines. Small knee joint effusion. Enthesopathic change involving the superior pole of the patella. A broad-based benign-appearing excrescence is noted arising from the lateral aspect of the distal femoral metaphysis without associated  periostitis. No evidence of chondrocalcinosis. Regional soft tissues appear normal. IMPRESSION: Mild-to-moderate tricompartmental degenerative change of the knee, worse within the medial compartment. Electronically Signed   By: Sandi Mariscal M.D.   On: 04/28/2019 10:40    Procedures Procedures (including critical care time)  Medications Ordered in ED Medications  HYDROmorphone (DILAUDID) injection 1 mg (has no administration in time range)  predniSONE (DELTASONE) tablet 60 mg (has no administration in time range)  HYDROmorphone (DILAUDID) injection 1 mg (1 mg Intravenous Given 04/28/19 1042)  ondansetron (ZOFRAN) injection 4 mg (4 mg Intravenous Given 04/28/19 1042)     Initial Impression / Assessment and Plan / ED Course  I have reviewed the triage vital signs and the nursing notes.  Pertinent labs & imaging results that were available during my care of the patient were reviewed by me and considered in my medical decision making (see chart for details).  Clinical Course as of Apr 28 1555  Thu Apr 28, 2019  1029 EKG 12-Lead [LM]    Clinical Course User Index [LM] Danton Clap      Patient work-up included x-rays of the right knee which shows a tricompartmental arthritis.  But I think patient's pain and swelling is more gout related.  Doppler study showed no DVT.  Patient's work-up for possible worsening congestive heart failure chest x-ray without evidence of any fluid.  Patient's BNP is not elevated.  Patient's creatinine is slightly worse than baseline.  Will treat patient with pain medication for the acute gout pain.  And short course of prednisone.  He will need to follow his blood sugars carefully.  Also recommend that he have his kidney function rechecked in about a week.  Patient does not meet any criteria for admission.   Final Clinical Impressions(s) / ED Diagnoses   Final diagnoses:  Acute gout of right knee, unspecified cause    ED Discharge Orders          Ordered    predniSONE (DELTASONE) 10 MG tablet  Daily  04/28/19 1555    HYDROcodone-acetaminophen (NORCO/VICODIN) 5-325 MG tablet  Every 6 hours PRN     04/28/19 1555           Fredia Sorrow, MD 04/28/19 [phone]

## 2019-05-03 DIAGNOSIS — E1169 Type 2 diabetes mellitus with other specified complication: Secondary | ICD-10-CM | POA: Diagnosis not present

## 2019-05-03 DIAGNOSIS — M25561 Pain in right knee: Secondary | ICD-10-CM | POA: Diagnosis not present

## 2019-05-03 DIAGNOSIS — E1165 Type 2 diabetes mellitus with hyperglycemia: Secondary | ICD-10-CM | POA: Diagnosis not present

## 2019-05-03 DIAGNOSIS — M10042 Idiopathic gout, left hand: Secondary | ICD-10-CM | POA: Diagnosis not present

## 2019-05-04 DIAGNOSIS — E1165 Type 2 diabetes mellitus with hyperglycemia: Secondary | ICD-10-CM | POA: Diagnosis not present

## 2019-05-04 DIAGNOSIS — R109 Unspecified abdominal pain: Secondary | ICD-10-CM | POA: Diagnosis not present

## 2019-05-05 ENCOUNTER — Emergency Department (HOSPITAL_COMMUNITY): Payer: Medicare Other

## 2019-05-05 ENCOUNTER — Encounter (HOSPITAL_COMMUNITY): Payer: Self-pay | Admitting: Emergency Medicine

## 2019-05-05 ENCOUNTER — Observation Stay (HOSPITAL_COMMUNITY)
Admission: EM | Admit: 2019-05-05 | Discharge: 2019-05-05 | Disposition: A | Discharge status: Home / Self Care | Payer: Medicare Other | Attending: Internal Medicine | Admitting: Internal Medicine

## 2019-05-05 ENCOUNTER — Other Ambulatory Visit: Payer: Self-pay

## 2019-05-05 DIAGNOSIS — Z6841 Body Mass Index (BMI) 40.0 and over, adult: Secondary | ICD-10-CM | POA: Diagnosis not present

## 2019-05-05 DIAGNOSIS — E119 Type 2 diabetes mellitus without complications: Secondary | ICD-10-CM | POA: Diagnosis not present

## 2019-05-05 DIAGNOSIS — I5033 Acute on chronic diastolic (congestive) heart failure: Secondary | ICD-10-CM | POA: Diagnosis present

## 2019-05-05 DIAGNOSIS — M109 Gout, unspecified: Secondary | ICD-10-CM | POA: Insufficient documentation

## 2019-05-05 DIAGNOSIS — I110 Hypertensive heart disease with heart failure: Secondary | ICD-10-CM | POA: Insufficient documentation

## 2019-05-05 DIAGNOSIS — Z7952 Long term (current) use of systemic steroids: Secondary | ICD-10-CM | POA: Insufficient documentation

## 2019-05-05 DIAGNOSIS — R109 Unspecified abdominal pain: Principal | ICD-10-CM | POA: Insufficient documentation

## 2019-05-05 DIAGNOSIS — Z7951 Long term (current) use of inhaled steroids: Secondary | ICD-10-CM | POA: Diagnosis not present

## 2019-05-05 DIAGNOSIS — K760 Fatty (change of) liver, not elsewhere classified: Secondary | ICD-10-CM | POA: Insufficient documentation

## 2019-05-05 DIAGNOSIS — R739 Hyperglycemia, unspecified: Secondary | ICD-10-CM

## 2019-05-05 DIAGNOSIS — Z794 Long term (current) use of insulin: Secondary | ICD-10-CM | POA: Diagnosis not present

## 2019-05-05 DIAGNOSIS — E1165 Type 2 diabetes mellitus with hyperglycemia: Secondary | ICD-10-CM | POA: Diagnosis not present

## 2019-05-05 DIAGNOSIS — N184 Chronic kidney disease, stage 4 (severe): Secondary | ICD-10-CM | POA: Diagnosis not present

## 2019-05-05 DIAGNOSIS — R1084 Generalized abdominal pain: Secondary | ICD-10-CM | POA: Diagnosis not present

## 2019-05-05 DIAGNOSIS — R252 Cramp and spasm: Secondary | ICD-10-CM | POA: Diagnosis not present

## 2019-05-05 DIAGNOSIS — K5900 Constipation, unspecified: Secondary | ICD-10-CM | POA: Diagnosis not present

## 2019-05-05 DIAGNOSIS — Z79899 Other long term (current) drug therapy: Secondary | ICD-10-CM | POA: Diagnosis not present

## 2019-05-05 LAB — COMPREHENSIVE METABOLIC PANEL
ALT: 19 U/L (ref 0–44)
AST: 11 U/L — ABNORMAL LOW (ref 15–41)
Albumin: 3.2 g/dL — ABNORMAL LOW (ref 3.5–5.0)
Alkaline Phosphatase: 103 U/L (ref 38–126)
Anion gap: 14 (ref 5–15)
BUN: 91 mg/dL — ABNORMAL HIGH (ref 6–20)
CO2: 24 mmol/L (ref 22–32)
Calcium: 7.1 mg/dL — ABNORMAL LOW (ref 8.9–10.3)
Chloride: 96 mmol/L — ABNORMAL LOW (ref 98–111)
Creatinine, Ser: 5.28 mg/dL — ABNORMAL HIGH (ref 0.61–1.24)
GFR calc Af Amer: 14 mL/min — ABNORMAL LOW (ref >60)
GFR calc non Af Amer: 12 mL/min — ABNORMAL LOW (ref >60)
Glucose, Bld: 277 mg/dL — ABNORMAL HIGH (ref 70–99)
Potassium: 3.7 mmol/L (ref 3.5–5.1)
Sodium: 134 mmol/L — ABNORMAL LOW (ref 135–145)
Total Bilirubin: 0.2 mg/dL — ABNORMAL LOW (ref 0.3–1.2)
Total Protein: 7.6 g/dL (ref 6.5–8.1)

## 2019-05-05 LAB — CBC WITH DIFFERENTIAL/PLATELET
Abs Immature Granulocytes: 0.04 10*3/uL (ref 0.00–0.07)
Basophils Absolute: 0 10*3/uL (ref 0.0–0.1)
Basophils Relative: 0 %
Eosinophils Absolute: 0.3 10*3/uL (ref 0.0–0.5)
Eosinophils Relative: 3 %
HCT: 38.8 % — ABNORMAL LOW (ref 39.0–52.0)
Hemoglobin: 12.5 g/dL — ABNORMAL LOW (ref 13.0–17.0)
Immature Granulocytes: 0 %
Lymphocytes Relative: 34 %
Lymphs Abs: 3.3 10*3/uL (ref 0.7–4.0)
MCH: 24.2 pg — ABNORMAL LOW (ref 26.0–34.0)
MCHC: 32.2 g/dL (ref 30.0–36.0)
MCV: 75 fL — ABNORMAL LOW (ref 80.0–100.0)
Monocytes Absolute: 1.1 10*3/uL — ABNORMAL HIGH (ref 0.1–1.0)
Monocytes Relative: 12 %
Neutro Abs: 5 10*3/uL (ref 1.7–7.7)
Neutrophils Relative %: 51 %
Platelets: 540 10*3/uL — ABNORMAL HIGH (ref 150–400)
RBC: 5.17 MIL/uL (ref 4.22–5.81)
RDW: 14.6 % (ref 11.5–15.5)
WBC: 9.8 10*3/uL (ref 4.0–10.5)
nRBC: 0 % (ref 0.0–0.2)

## 2019-05-05 LAB — CBG MONITORING, ED: Glucose-Capillary: 301 mg/dL — ABNORMAL HIGH (ref 70–99)

## 2019-05-05 LAB — LIPASE, BLOOD: Lipase: 43 U/L (ref 11–51)

## 2019-05-05 LAB — BRAIN NATRIURETIC PEPTIDE: B Natriuretic Peptide: 80 pg/mL (ref 0.0–100.0)

## 2019-05-05 MED ORDER — HYDROMORPHONE HCL 1 MG/ML IJ SOLN
1.0000 mg | Freq: Once | INTRAMUSCULAR | Status: AC
  Administered 2019-05-05: 1 mg via INTRAVENOUS
  Filled 2019-05-05: qty 1

## 2019-05-05 MED ORDER — CLONIDINE HCL 0.2 MG PO TABS
0.2000 mg | ORAL_TABLET | Freq: Once | ORAL | Status: AC
  Administered 2019-05-05: 0.2 mg via ORAL
  Filled 2019-05-05: qty 1

## 2019-05-05 MED ORDER — SODIUM CHLORIDE 0.9 % IV BOLUS
500.0000 mL | Freq: Once | INTRAVENOUS | Status: AC
  Administered 2019-05-05: 500 mL via INTRAVENOUS

## 2019-05-05 MED ORDER — ONDANSETRON HCL 4 MG/2ML IJ SOLN
4.0000 mg | Freq: Once | INTRAMUSCULAR | Status: AC
  Administered 2019-05-05: 4 mg via INTRAVENOUS
  Filled 2019-05-05: qty 2

## 2019-05-05 NOTE — ED Triage Notes (Signed)
Pt C/O abdominal cramping that began after taking his nightly insulin tonight. Pt also C/O high BS throughout the day.

## 2019-05-05 NOTE — ED Provider Notes (Signed)
[facility] EMERGENCY DEPARTMENT Provider Note   CSN: [id number] Arrival date & time: 05/05/19  0017    History   Chief Complaint Chief Complaint  Patient presents with  . Abdominal Cramping    HPI Kelly Key is a 48 y.o. male.     Patient presents for evaluation of elevated blood sugar and abdominal cramping.  Patient reports that his blood sugar has been running high through the day.  Tonight it was over 600 (his meter read "high").  He took his nighttime Levemir 45 units and then took an additional 20 units of NovoLog.  After doing this he started having abdominal cramping.  He reports that this often happens when his sugar is high and he needs to take additional insulin to bring it down  He then drank 2 sodas to help with the cramping, as he has noticed this helps in the past.  No nausea or vomiting.  No infectious symptoms.  He has been doing well through the course of today, eating normally, no unusual activity.     Past Medical History:  Diagnosis Date  . Acute diastolic CHF (congestive heart failure) ([place]) 06/11/2012   EF 50-55% [facility])  . Diabetes mellitus    A1c 11.5 (06/11/2012).  . Gout   . Hepatic steatosis 06/11/2012   Elevated LFTs  . Hyperlipemia   . Malignant hypertension   . Microcytic anemia 06/12/2012  . Obesity     Patient Active Problem List   Diagnosis Date Noted  . Acute on chronic diastolic congestive heart failure ([place]) 05/05/2019  . Thromboembolism ([place]) 02/20/2019  . AKI (acute kidney injury) ([place]) 02/20/2019  . CHF exacerbation ([place]) 03/26/2018  . Acute on chronic diastolic CHF (congestive heart failure) ([place]) 03/26/2018  . Acute respiratory failure with hypoxia ([place]) 03/26/2018  . Diabetes mellitus ([place]) 06/12/2012  . Hyponatremia 06/12/2012  . Microcytic anemia 06/12/2012  . Acute diastolic CHF (congestive heart failure) ([place]) 06/11/2012  . HTN (hypertension) 06/11/2012  . Elevated LFTs 06/11/2012  . Morbid obesity ([place])  06/11/2012  . Hepatic steatosis 06/11/2012    Past Surgical History:  Procedure Laterality Date  . NO PAST SURGERIES          Home Medications    Prior to Admission medications   Medication Sig Start Date End Date Taking? Authorizing Provider  albuterol (VENTOLIN HFA) 108 (90 Base) MCG/ACT inhaler Inhale 2 puffs into the lungs every 4 (four) hours as needed. 03/24/19   [provider]  amLODipine (NORVASC) 10 MG tablet Take 10 mg by mouth daily.    [provider]  cloNIDine (CATAPRES) 0.3 MG tablet Take 1 tablet (0.3 mg total) by mouth 3 (three) times daily. 04/04/18 04/28/19  Manuella Ghazi, Pratik D, DO  COLCRYS 0.6 MG tablet Take 0.6 mg by mouth 2 (two) times daily. 03/22/19   [provider]  ergocalciferol (VITAMIN D2) 1.25 MG (50000 UT) capsule Take 50,000 Units by mouth once a week.    [provider]  ezetimibe (ZETIA) 10 MG tablet Take 10 mg by mouth every evening. 01/20/19   [provider]  fluticasone (FLONASE) 50 MCG/ACT nasal spray Place 1 spray into both nostrils daily. 04/04/18 04/28/19  Manuella Ghazi, Pratik D, DO  HYDROcodone-acetaminophen (NORCO/VICODIN) 5-325 MG tablet Take 1 tablet by mouth every 6 (six) hours as needed. 04/28/19   Fredia Sorrow, MD  insulin aspart (NOVOLOG) 100 UNIT/ML injection Inject 5-15 Units into the skin 3 (three) times daily before meals.  [provider]  insulin detemir (LEVEMIR) 100 UNIT/ML injection Inject 0.15-0.45 mLs (15-45 Units total) into the skin 2 (two) times daily. Patient takes 15 units in the morning and 45 units in the evening 02/23/19   Johnson, Clanford L, MD  Insulin Pen Needle 31G X 5 MM MISC Use as directed. 04/04/18   Manuella Ghazi, Pratik D, DO  labetalol (NORMODYNE) 200 MG tablet Take 200 mg by mouth 2 (two) times daily.    [provider]  linaclotide Rolan Lipa) 145 MCG CAPS capsule Take 1 capsule (145 mcg total) by mouth daily before breakfast. 04/05/18   Manuella Ghazi, Pratik D, DO  potassium chloride  (MICRO-K) 10 MEQ CR capsule Take 10 mEq by mouth daily. 04/21/19   [provider]  potassium chloride SA (K-DUR) 20 MEQ tablet Take 1 tablet (20 mEq total) by mouth daily. 02/28/19   Johnson, Clanford L, MD  predniSONE (DELTASONE) 10 MG tablet Take 4 tablets (40 mg total) by mouth daily. 04/28/19   Fredia Sorrow, MD  sodium bicarbonate 650 MG tablet Take 650 mg by mouth 2 (two) times daily.    [provider]  torsemide (DEMADEX) 20 MG tablet TAKE 60 MG IN THE MORNING AND 40 MG IN THE EVENING Patient taking differently: Take 20 mg by mouth 3 (three) times daily.  04/18/19   Arnoldo Lenis, MD    Family History Family History  Problem Relation Age of Onset  . Gout Mother   . Asthma Mother   . Diabetes Father   . Heart failure Father   . Diabetes Sister   . Hypertension Brother   . Pancreatic cancer Brother   . Diabetes Sister     Social History Social History   Tobacco Use  . Smoking status: Never Smoker  . Smokeless tobacco: Never Used  Substance Use Topics  . Alcohol use: No    Comment: haven't drank in over 5 years   . Drug use: No     Allergies   Hydralazine and Lisinopril   Review of Systems Review of Systems  Gastrointestinal: Positive for abdominal pain.  All other systems reviewed and are negative.    Physical Exam Updated Vital Signs BP (!) 137/92   Pulse 94   Temp (!) 97.5 F (36.4 C) (Oral)   Resp 20   SpO2 94%   Physical Exam Vitals signs and nursing note reviewed.  Constitutional:      General: He is not in acute distress.    Appearance: Normal appearance. He is well-developed.  HENT:     Head: Normocephalic and atraumatic.     Right Ear: Hearing normal.     Left Ear: Hearing normal.     Nose: Nose normal.  Eyes:     Conjunctiva/sclera: Conjunctivae normal.     Pupils: Pupils are equal, round, and reactive to light.  Neck:     Musculoskeletal: Normal range of motion and neck supple.  Cardiovascular:     Rate and  Rhythm: Regular rhythm.     Heart sounds: S1 normal and S2 normal. No murmur. No friction rub. No gallop.   Pulmonary:     Effort: Pulmonary effort is normal. No respiratory distress.     Breath sounds: Normal breath sounds.  Chest:     Chest wall: No tenderness.  Abdominal:     General: Bowel sounds are normal.     Palpations: Abdomen is soft.     Tenderness: There is no abdominal tenderness. There is no guarding or  rebound. Negative signs include Murphy's sign and McBurney's sign.     Hernia: No hernia is present.  Musculoskeletal: Normal range of motion.  Skin:    General: Skin is warm and dry.     Findings: No rash.  Neurological:     Mental Status: He is alert and oriented to person, place, and time.     GCS: GCS eye subscore is 4. GCS verbal subscore is 5. GCS motor subscore is 6.     Cranial Nerves: No cranial nerve deficit.     Sensory: No sensory deficit.     Coordination: Coordination normal.  Psychiatric:        Speech: Speech normal.        Behavior: Behavior normal.        Thought Content: Thought content normal.      ED Treatments / Results  Labs (all labs ordered are listed, but only abnormal results are displayed) Labs Reviewed  CBC WITH DIFFERENTIAL/PLATELET - Abnormal; Notable for the following components:      Result Value   Hemoglobin 12.5 (*)    HCT 38.8 (*)    MCV 75.0 (*)    MCH 24.2 (*)    Platelets 540 (*)    Monocytes Absolute 1.1 (*)    All other components within normal limits  COMPREHENSIVE METABOLIC PANEL - Abnormal; Notable for the following components:   Sodium 134 (*)    Chloride 96 (*)    Glucose, Bld 277 (*)    BUN 91 (*)    Creatinine, Ser 5.28 (*)    Calcium 7.1 (*)    Albumin 3.2 (*)    AST 11 (*)    Total Bilirubin 0.2 (*)    GFR calc non Af Amer 12 (*)    GFR calc Af Amer 14 (*)    All other components within normal limits  CBG MONITORING, ED - Abnormal; Notable for the following components:   Glucose-Capillary 301 (*)     All other components within normal limits  LIPASE, BLOOD  BRAIN NATRIURETIC PEPTIDE  URINALYSIS, ROUTINE W REFLEX MICROSCOPIC    EKG None  Radiology Dg Abd Acute 2+v W 1v Chest  Result Date: 05/05/2019 CLINICAL DATA:  Abdominal pain. EXAM: DG ABDOMEN ACUTE W/ 1V CHEST COMPARISON:  None. FINDINGS: There is no evidence of dilated bowel loops or free intraperitoneal air. There is a large amount of stool throughout the colon. No radiopaque calculi or other significant radiographic abnormality is seen. Heart size and mediastinal contours are within normal limits. Both lungs are clear. IMPRESSION: 1. No acute cardiopulmonary process. 2. Nonobstructive bowel gas pattern. 3. Large amount of stool throughout the colon. Electronically Signed   By: Constance Holster M.D.   On: 05/05/2019 01:33    Procedures Procedures (including critical care time)  Medications Ordered in ED Medications  sodium chloride 0.9 % bolus 500 mL (0 mLs Intravenous Stopped 05/05/19 0229)  HYDROmorphone (DILAUDID) injection 1 mg (1 mg Intravenous Given 05/05/19 0136)  ondansetron (ZOFRAN) injection 4 mg (4 mg Intravenous Given 05/05/19 0136)     Initial Impression / Assessment and Plan / ED Course  I have reviewed the triage vital signs and the nursing notes.  Pertinent labs & imaging results that were available during my care of the patient were reviewed by me and considered in my medical decision making (see chart for details).        Patient presents to the emergency department for evaluation of abdominal cramping.  Patient  reports that he has noticed that for the last several months he gets cramping in his abdomen after he takes his insulin.  It is particularly bad when his blood sugar is very high.  He reports he usually drinks a soda and the pain goes away.  He took his blood sugar today and it was over 600.  He took his nighttime Levemir as well as correction NovoLog and then began cramping.  He drank 2 sodas but  the cramping did not go away, so he came to the ER.  Patient administered a small fluid bolus and analgesia at arrival.  His pain has resolved.  Blood work is at his baseline.  Repeat examination after analgesia reveals nontender, benign abdomen.  Discussed with patient that he will need to follow-up with primary care for further consideration of changes of his insulin secondary to intolerance.  Final Clinical Impressions(s) / ED Diagnoses   Final diagnoses:  Abdominal cramping  Hyperglycemia    ED Discharge Orders    None       Orpah Greek, MD 05/05/19 [phone]

## 2019-05-10 NOTE — Progress Notes (Signed)
Cardiology Office Note    Date:  05/11/2019   ID:  Kelly Key, DOB Jul 28, 1971, MRN [id number]  PCP:  [name], MD  Cardiologist: Carlyle Dolly, MD    Chief Complaint  Patient presents with  . Follow-up    3 week visit    History of Present Illness:    Kelly Key is a 48 y.o. male with past medical history of chronic diastolic CHF, HTN, HLD, IDDM, and Stage 4 CKD who presents to the office today for 3-week follow-up.  He was last examined by Dr. Harl Bowie in 03/2019 as a New Patient referral and reported worsening lower extremity edema and orthopnea. Reported good compliance with his current medication regimen including Torsemide 40 mg in AM and 20 mg in PM. Noted that his home weight had been between 295 -300 lbs but was 331 lbs on the office scales. Torsemide was titrated to 60mg  in AM/40mg  in PM. Repeat labs were obtained on 7/1 and showed that his creatinine had acutely worsened and was elevated to 5.10.  In the interim, he presented to [facility] ED on 04/28/2019 for evaluation of worsening dyspnea and right knee pain. Repeat labs were obtained at that time and showed BNP was negative at 85 but creatinine was elevated to 4.93 (previously 3.52 in 01/2019). CXR showed cardiomegaly and pulmonary venous congestion with no definitive edema. He was treated for acute gout with a short course of prednisone and was continued on his current torsemide dosing with nephrology follow-up recommended. Presented back to the ED on 05/05/2019 for abdominal pain and elevated glucose. BNP and Lipase were within normal limits. Creatinine was elevated to 5.28. Abdominal film showed no acute findings with a nonobstructive bowel gas pattern.  He was again discharged home and no changes were made to his medication regimen.  In talking with the patient today, he reports improvement in his lower extremity edema but still has significant swelling. Denies any associated orthopnea or PND. Has dyspnea on exertion at  baseline but denies any acute change in his symptoms. No recent chest pain or palpitations.   BP is elevated at 157/102 during today's visit but he has only taken Labetalol. Not yet taken his Amlodipine or Clonidine. Still having good urine output with Torsemide. Has not been evaluated by Dr. Lowanda Foster since 12/2018. Was suppose to have a renal biopsy but this was postponed due to COVID.   Past Medical History:  Diagnosis Date  . Acute diastolic CHF (congestive heart failure) ([place]) 06/11/2012   EF 50-55% [facility])  . Diabetes mellitus    A1c 11.5 (06/11/2012).  . Gout   . Hepatic steatosis 06/11/2012   Elevated LFTs  . Hyperlipemia   . Malignant hypertension   . Microcytic anemia 06/12/2012  . Obesity     Past Surgical History:  Procedure Laterality Date  . NO PAST SURGERIES      Current Medications: Outpatient Medications Prior to Visit  Medication Sig Dispense Refill  . albuterol (VENTOLIN HFA) 108 (90 Base) MCG/ACT inhaler Inhale 2 puffs into the lungs every 4 (four) hours as needed.    [name] amLODipine (NORVASC) 10 MG tablet Take 10 mg by mouth daily.    . cloNIDine (CATAPRES) 0.3 MG tablet Take 1 tablet (0.3 mg total) by mouth 3 (three) times daily. 90 tablet 0  . COLCRYS 0.6 MG tablet Take 0.6 mg by mouth 2 (two) times daily.    . ergocalciferol (VITAMIN D2) 1.25 MG (50000 UT) capsule Take 50,000  Units by mouth once a week.    . ezetimibe (ZETIA) 10 MG tablet Take 10 mg by mouth every evening.    . fluticasone (FLONASE) 50 MCG/ACT nasal spray Place 1 spray into both nostrils daily. 1 g 0  . HYDROcodone-acetaminophen (NORCO/VICODIN) 5-325 MG tablet Take 1 tablet by mouth every 6 (six) hours as needed. 10 tablet 0  . insulin aspart (NOVOLOG) 100 UNIT/ML injection Inject 5-15 Units into the skin 3 (three) times daily before meals.     . insulin detemir (LEVEMIR) 100 UNIT/ML injection Inject 0.15-0.45 mLs (15-45 Units total) into the skin 2 (two) times daily. Patient takes 15  units in the morning and 45 units in the evening 10 mL 11  . Insulin Pen Needle 31G X 5 MM MISC Use as directed. 100 each 0  . labetalol (NORMODYNE) 200 MG tablet Take 200 mg by mouth 2 (two) times daily.    [name] linaclotide (LINZESS) 145 MCG CAPS capsule Take 1 capsule (145 mcg total) by mouth daily before breakfast. 30 capsule 0  . potassium chloride (MICRO-K) 10 MEQ CR capsule Take 10 mEq by mouth daily.    . potassium chloride SA (K-DUR) 20 MEQ tablet Take 1 tablet (20 mEq total) by mouth daily.    . sodium bicarbonate 650 MG tablet Take 650 mg by mouth 2 (two) times daily.    [name] torsemide (DEMADEX) 20 MG tablet TAKE 60 MG IN THE MORNING AND 40 MG IN THE EVENING (Patient taking differently: Take 20 mg by mouth 3 (three) times daily. ) 150 tablet 1  . predniSONE (DELTASONE) 10 MG tablet Take 4 tablets (40 mg total) by mouth daily. 20 tablet 0   No facility-administered medications prior to visit.      Allergies:   Hydralazine and Lisinopril   Social History   Socioeconomic History  . Marital status: Single    Spouse name: Not on file  . Number of children: Not on file  . Years of education: Not on file  . Highest education level: Not on file  Occupational History  . Not on file  Social Needs  . Financial resource strain: Not hard at all  . Food insecurity    Worry: Patient refused    Inability: Patient refused  . Transportation needs    Medical: Patient refused    Non-medical: Patient refused  Tobacco Use  . Smoking status: Never Smoker  . Smokeless tobacco: Never Used  Substance and Sexual Activity  . Alcohol use: No    Comment: haven't drank in over 5 years   . Drug use: No  . Sexual activity: Not Currently  Lifestyle  . Physical activity    Days per week: Patient refused    Minutes per session: Patient refused  . Stress: Not at all  Relationships  . Social Herbalist on phone: Patient refused    Gets together: Patient refused    Attends religious service:  Patient refused    Active member of club or organization: Patient refused    Attends meetings of clubs or organizations: Patient refused    Relationship status: Patient refused  Other Topics Concern  . Not on file  Social History Narrative  . Not on file     Family History:  The patient's family history includes Asthma in his mother; Diabetes in his father, sister, and sister; Gout in his mother; Heart failure in his father; Hypertension in his brother; Pancreatic cancer in his brother.  Review of Systems:   Please see the history of present illness.     General:  No chills, fever, night sweats or weight changes.  Cardiovascular:  No chest pain, orthopnea, palpitations, paroxysmal nocturnal dyspnea. Positive for dyspnea on exertion and edema.  Dermatological: No rash, lesions/masses Respiratory: No cough, dyspnea Urologic: No hematuria, dysuria Abdominal:   No nausea, vomiting, diarrhea, bright red blood per rectum, melena, or hematemesis Neurologic:  No visual changes, wkns, changes in mental status. All other systems reviewed and are otherwise negative except as noted above.   Physical Exam:    VS:  BP (!) 157/102   Pulse 94   Temp 99.6 F (37.6 C)   Ht 5\' 7"  (1.702 m)   Wt (!) 315 lb (142.9 kg)   BMI 49.34 kg/m    General: Well developed, obese male appearing in no acute distress. Head: Normocephalic, atraumatic, sclera non-icteric, no xanthomas, nares are without discharge.  Neck: No carotid bruits. JVD difficult to assess secondary to body habitus. Lungs: Respirations regular and unlabored, without wheezes or rales.  Heart: Regular rate and rhythm. No S3 or S4.  No murmur, no rubs, or gallops appreciated. Abdomen: Soft, non-tender, non-distended with normoactive bowel sounds. No hepatomegaly. No rebound/guarding. No obvious abdominal masses. Msk:  Strength and tone appear normal for age. No joint deformities or effusions. Extremities: No clubbing or cyanosis. 2+  pitting edema up to mid-shins bilaterally.  Distal pedal pulses are 2+ bilaterally. Neuro: Alert and oriented X 3. Moves all extremities spontaneously. No focal deficits noted. Psych:  Responds to questions appropriately with a normal affect. Skin: No rashes or lesions noted  Wt Readings from Last 3 Encounters:  05/11/19 (!) 315 lb (142.9 kg)  04/28/19 (!) 320 lb (145.2 kg)  04/18/19 (!) 331 lb 9.6 oz (150.4 kg)     Studies/Labs Reviewed:   EKG:  EKG is not ordered today.    Recent Labs: 02/22/2019: Magnesium 2.0 05/05/2019: ALT 19; B Natriuretic Peptide 80.0; BUN 91; Creatinine, Ser 5.28; Hemoglobin 12.5; Platelets 540; Potassium 3.7; Sodium 134   Lipid Panel No results found for: CHOL, TRIG, HDL, CHOLHDL, VLDL, LDLCALC, LDLDIRECT  Additional studies/ records that were reviewed today include:   Echocardiogram: 03/2018 Study Conclusions  - Left ventricle: The cavity size was normal. Wall thickness was   increased in a pattern of moderate LVH. Systolic function was   normal. The estimated ejection fraction was in the range of 60%   to 65%. Wall motion was normal; there were no regional wall   motion abnormalities. Findings consistent with left ventricular   diastolic dysfunction, grade indeterminate. Indeterminate filling   pressures.  Assessment:    1. Acute on chronic diastolic CHF (congestive heart failure) ([place])   2. CKD (chronic kidney disease), stage IV ([place])   3. Essential hypertension   4. Type 2 diabetes mellitus with stage 3 chronic kidney disease, with long-term current use of insulin ([place])      Plan:   In order of problems listed above:  1. Acute on Chronic Diastolic CHF - recent echo showed a preserved EF with diastolic dysfunction and LVH. Weight has declined by over 15 lbs since his last office visit but he remains volume overloaded by examination. Currently on Torsemide 60mg  in AM/40mg  in PM. Given his current renal function, will not further titrate  Torsemide at this time. Continued sodium restriction reviewed.   2. Stage 4 CKD - creatinine elevated at 5.28 when checked last week. Previously ~ 3.0  earlier this year. I offered an urgent referral back to Dr. Lowanda Foster but the patient said he would call him this afternoon as he is already an established patient. Needs to be rescheduled for renal biopsy. Will defer dose adjustment of diuretic therapy to Nephrology given his progressive renal dysfunction over the past few months. The patient initially told me he would "never consider dialysis" but in discussing his worsening renal function, later mentioned he might be open to it depending on the quality of his health at that time.   3. HTN - BP elevated at 157/102 during today's visit and I obtained similar results when rechecked. Currently on Amlodipine 10mg  daily, Clonidine 0.3mg  TID, and Labetalol 200mg  BID yet he has only taken his Labetalol today. Reviewed the importance of taking medications at a consistent time. Continue to follow. Would further titrate Labetalol if BP remains above goal or consider switching to Coreg.   4. IDDM - followed by PCP. Remains on Novolog and Levemir.    Medication Adjustments/Labs and Tests Ordered: Current medicines are reviewed at length with the patient today.  Concerns regarding medicines are outlined above.  Medication changes, Labs and Tests ordered today are listed in the Patient Instructions below. Patient Instructions  Medication Instructions:  Your physician recommends that you continue on your current medications as directed. Please refer to the Current Medication list given to you today.   Labwork: NONE  Testing/Procedures: NONE  Follow-Up: Your physician recommends that you schedule a follow-up appointment in: 2 MONTHS    Any Other Special Instructions Will Be Listed Below (If Applicable).  PLEASE CALL DR. Florentina Addison OFFICE AND MAKE A FOLLOW UP VISIT - ASAP   If you need a refill on  your cardiac medications before your next appointment, please call your pharmacy.      Signed, Kelly Heritage, PA-C  05/11/2019 7:44 PM    [facility] [address] [place], [place] [address] Phone: [phone] Fax: [phone]

## 2019-05-11 ENCOUNTER — Encounter: Payer: Self-pay | Admitting: Student

## 2019-05-11 ENCOUNTER — Other Ambulatory Visit: Payer: Self-pay

## 2019-05-11 ENCOUNTER — Ambulatory Visit (INDEPENDENT_AMBULATORY_CARE_PROVIDER_SITE_OTHER): Payer: Medicare Other | Admitting: Student

## 2019-05-11 VITALS — BP 157/102 | HR 94 | Temp 99.6°F | Ht 67.0 in | Wt 315.0 lb

## 2019-05-11 DIAGNOSIS — Z794 Long term (current) use of insulin: Secondary | ICD-10-CM | POA: Diagnosis not present

## 2019-05-11 DIAGNOSIS — N184 Chronic kidney disease, stage 4 (severe): Secondary | ICD-10-CM

## 2019-05-11 DIAGNOSIS — N183 Chronic kidney disease, stage 3 unspecified: Secondary | ICD-10-CM

## 2019-05-11 DIAGNOSIS — I5033 Acute on chronic diastolic (congestive) heart failure: Secondary | ICD-10-CM | POA: Diagnosis not present

## 2019-05-11 DIAGNOSIS — I10 Essential (primary) hypertension: Secondary | ICD-10-CM

## 2019-05-11 DIAGNOSIS — E1122 Type 2 diabetes mellitus with diabetic chronic kidney disease: Secondary | ICD-10-CM

## 2019-05-11 NOTE — Patient Instructions (Signed)
Medication Instructions:  Your physician recommends that you continue on your current medications as directed. Please refer to the Current Medication list given to you today.   Labwork: NONE  Testing/Procedures: NONE  Follow-Up: Your physician recommends that you schedule a follow-up appointment in: 2 MONTHS    Any Other Special Instructions Will Be Listed Below (If Applicable).  PLEASE CALL DR. Florentina Addison OFFICE AND MAKE A FOLLOW UP VISIT - ASAP   If you need a refill on your cardiac medications before your next appointment, please call your pharmacy.

## 2019-05-12 ENCOUNTER — Telehealth (HOSPITAL_COMMUNITY): Payer: Self-pay | Admitting: *Deleted

## 2019-05-12 DIAGNOSIS — N184 Chronic kidney disease, stage 4 (severe): Secondary | ICD-10-CM | POA: Diagnosis not present

## 2019-05-12 DIAGNOSIS — R202 Paresthesia of skin: Secondary | ICD-10-CM | POA: Diagnosis not present

## 2019-05-12 DIAGNOSIS — I10 Essential (primary) hypertension: Secondary | ICD-10-CM | POA: Diagnosis not present

## 2019-05-12 DIAGNOSIS — I509 Heart failure, unspecified: Secondary | ICD-10-CM | POA: Diagnosis not present

## 2019-05-12 DIAGNOSIS — E118 Type 2 diabetes mellitus with unspecified complications: Secondary | ICD-10-CM | POA: Diagnosis not present

## 2019-05-12 NOTE — Telephone Encounter (Addendum)
Left detailed msg on home number listed in Epic since approval to do so is listed in pt chart.  Attempting to reach pt to schedule appointment.  Tried cell listed in Epic and also home listed on paper referral.

## 2019-05-12 NOTE — Telephone Encounter (Signed)
error 

## 2019-05-13 ENCOUNTER — Telehealth: Payer: Self-pay | Admitting: Student

## 2019-05-13 NOTE — Telephone Encounter (Signed)
Kelly Key to be aware of his apt scheduled for 8/20 for Renal US

## 2019-05-13 NOTE — Telephone Encounter (Signed)
[id number] 

## 2019-05-13 NOTE — Telephone Encounter (Signed)
   Thank you for the update. Would still recommend he follow up with Nephrology prior to this given his worsening renal function.   Signed, Erma Heritage, PA-C 05/13/2019, 11:49 AM Pager: [phone]

## 2019-05-16 NOTE — Telephone Encounter (Signed)
[id number] 

## 2019-05-17 NOTE — Telephone Encounter (Signed)
Patient states that Dr.Befakadu wants him to have renal US in [place] before he sees him in August

## 2019-05-24 ENCOUNTER — Telehealth: Payer: Self-pay | Admitting: Cardiology

## 2019-05-24 NOTE — Telephone Encounter (Signed)
Blusters on leg

## 2019-05-24 NOTE — Telephone Encounter (Signed)
[id number] 

## 2019-05-26 NOTE — Telephone Encounter (Signed)
Returned pt call. No answer. Left msg to call back.  

## 2019-06-01 NOTE — Telephone Encounter (Signed)
Called pt. NA. Unable to leave msg .

## 2019-06-07 ENCOUNTER — Other Ambulatory Visit: Payer: Self-pay

## 2019-06-07 DIAGNOSIS — I729 Aneurysm of unspecified site: Secondary | ICD-10-CM

## 2019-06-16 ENCOUNTER — Encounter: Payer: Self-pay | Admitting: Vascular Surgery

## 2019-06-16 ENCOUNTER — Other Ambulatory Visit: Payer: Self-pay

## 2019-06-16 ENCOUNTER — Ambulatory Visit (HOSPITAL_COMMUNITY)
Admission: RE | Admit: 2019-06-16 | Discharge: 2019-06-16 | Disposition: A | Discharge status: Home / Self Care | Payer: Medicare Other | Source: Ambulatory Visit | Attending: Vascular Surgery | Admitting: Vascular Surgery

## 2019-06-16 ENCOUNTER — Other Ambulatory Visit: Payer: Self-pay | Admitting: Vascular Surgery

## 2019-06-16 ENCOUNTER — Ambulatory Visit (INDEPENDENT_AMBULATORY_CARE_PROVIDER_SITE_OTHER): Payer: Medicare Other | Admitting: Vascular Surgery

## 2019-06-16 VITALS — BP 182/117 | HR 85 | Temp 97.9°F | Resp 14 | Ht 67.0 in | Wt 306.0 lb

## 2019-06-16 DIAGNOSIS — I729 Aneurysm of unspecified site: Secondary | ICD-10-CM | POA: Diagnosis not present

## 2019-06-16 DIAGNOSIS — I10 Essential (primary) hypertension: Secondary | ICD-10-CM

## 2019-06-16 DIAGNOSIS — I872 Venous insufficiency (chronic) (peripheral): Secondary | ICD-10-CM

## 2019-06-16 NOTE — Progress Notes (Signed)
Referring Physician: Dr. Lowanda Foster  Patient name: Kelly Key MRN: [id number] DOB: Dec 10, 1970 Sex: male  REASON FOR CONSULT: Rule out renal artery stenosis  HPI: Kelly Key is a 48 y.o. male, with CKD5.  During the course of work-up for his renal dysfunction he was noted to have size discrepancy of his kidneys.  He also has very poorly controlled hypertension.  However sometimes he does not take his medications.  He is on amlodipine clonidine labetalol and torsemide for blood pressure control. Other medical problems include congestive heart failure, diabetes, hyperlipidemia, obesity all of which are currently stable.  He does not always have very good control of his diabetes.  Past Medical History:  Diagnosis Date  . Acute diastolic CHF (congestive heart failure) ([place]) 06/11/2012   EF 50-55% [facility])  . Diabetes mellitus    A1c 11.5 (06/11/2012).  . Gout   . Hepatic steatosis 06/11/2012   Elevated LFTs  . Hyperlipemia   . Malignant hypertension   . Microcytic anemia 06/12/2012  . Obesity    Past Surgical History:  Procedure Laterality Date  . NO PAST SURGERIES      Family History  Problem Relation Age of Onset  . Gout Mother   . Asthma Mother   . Diabetes Father   . Heart failure Father   . Diabetes Sister   . Hypertension Brother   . Pancreatic cancer Brother   . Diabetes Sister     SOCIAL HISTORY: Social History   Socioeconomic History  . Marital status: Single    Spouse name: Not on file  . Number of children: Not on file  . Years of education: Not on file  . Highest education level: Not on file  Occupational History  . Not on file  Social Needs  . Financial resource strain: Not hard at all  . Food insecurity    Worry: Patient refused    Inability: Patient refused  . Transportation needs    Medical: Patient refused    Non-medical: Patient refused  Tobacco Use  . Smoking status: Never Smoker  . Smokeless tobacco: Never Used  Substance and  Sexual Activity  . Alcohol use: No    Comment: haven't drank in over 5 years   . Drug use: No  . Sexual activity: Not Currently  Lifestyle  . Physical activity    Days per week: Patient refused    Minutes per session: Patient refused  . Stress: Not at all  Relationships  . Social Herbalist on phone: Patient refused    Gets together: Patient refused    Attends religious service: Patient refused    Active member of club or organization: Patient refused    Attends meetings of clubs or organizations: Patient refused    Relationship status: Patient refused  . Intimate partner violence    Fear of current or ex partner: Patient refused    Emotionally abused: Patient refused    Physically abused: Patient refused    Forced sexual activity: Patient refused  Other Topics Concern  . Not on file  Social History Narrative  . Not on file    Allergies  Allergen Reactions  . Hydralazine   . Lisinopril Swelling    Current Outpatient Medications  Medication Sig Dispense Refill  . albuterol (VENTOLIN HFA) 108 (90 Base) MCG/ACT inhaler Inhale 2 puffs into the lungs every 4 (four) hours as needed.    [name] amLODipine (NORVASC) 10 MG tablet Take 10 mg  by mouth daily.    [name] COLCRYS 0.6 MG tablet Take 0.6 mg by mouth 2 (two) times daily.    . ergocalciferol (VITAMIN D2) 1.25 MG (50000 UT) capsule Take 50,000 Units by mouth once a week.    . ezetimibe (ZETIA) 10 MG tablet Take 10 mg by mouth every evening.    [name] HYDROcodone-acetaminophen (NORCO/VICODIN) 5-325 MG tablet Take 1 tablet by mouth every 6 (six) hours as needed. 10 tablet 0  . insulin aspart (NOVOLOG) 100 UNIT/ML injection Inject 5-15 Units into the skin 3 (three) times daily before meals.     . insulin detemir (LEVEMIR) 100 UNIT/ML injection Inject 0.15-0.45 mLs (15-45 Units total) into the skin 2 (two) times daily. Patient takes 15 units in the morning and 45 units in the evening 10 mL 11  . Insulin Pen Needle 31G X 5 MM MISC Use  as directed. 100 each 0  . labetalol (NORMODYNE) 200 MG tablet Take 200 mg by mouth 2 (two) times daily.    [name] linaclotide (LINZESS) 145 MCG CAPS capsule Take 1 capsule (145 mcg total) by mouth daily before breakfast. 30 capsule 0  . potassium chloride (MICRO-K) 10 MEQ CR capsule Take 10 mEq by mouth daily.    . potassium chloride SA (K-DUR) 20 MEQ tablet Take 1 tablet (20 mEq total) by mouth daily.    . sodium bicarbonate 650 MG tablet Take 650 mg by mouth 2 (two) times daily.    [name] torsemide (DEMADEX) 20 MG tablet TAKE 60 MG IN THE MORNING AND 40 MG IN THE EVENING (Patient taking differently: Take 20 mg by mouth 3 (three) times daily. ) 150 tablet 1  . cloNIDine (CATAPRES) 0.3 MG tablet Take 1 tablet (0.3 mg total) by mouth 3 (three) times daily. 90 tablet 0  . fluticasone (FLONASE) 50 MCG/ACT nasal spray Place 1 spray into both nostrils daily. 1 g 0   No current facility-administered medications for this visit.     ROS:   General:  No weight loss, Fever, chills  HEENT: No recent headaches, no nasal bleeding, no visual changes, no sore throat  Neurologic: No dizziness, blackouts, seizures. No recent symptoms of stroke or mini- stroke. No recent episodes of slurred speech, or temporary blindness.  Cardiac: No recent episodes of chest pain/pressure, no shortness of breath at rest.  No shortness of breath with exertion.  Denies history of atrial fibrillation or irregular heartbeat  Vascular: No history of rest pain in feet.  No history of claudication.  No history of non-healing ulcer, No history of DVT   Pulmonary: No home oxygen, no productive cough, no hemoptysis,  No asthma or wheezing  Musculoskeletal:  [ ]  Arthritis, [ ]  Low back pain,  [ ]  Joint pain  Hematologic:No history of hypercoagulable state.  No history of easy bleeding.  No history of anemia  Gastrointestinal: No hematochezia or melena,  No gastroesophageal reflux, no trouble swallowing  Urinary: [X]  chronic Kidney  disease, [ ]  on HD - [ ]  MWF or [ ]  TTHS, [ ]  Burning with urination, [ ]  Frequent urination, [ ]  Difficulty urinating;   Skin: No rashes  Psychological: No history of anxiety,  No history of depression   Physical Examination  Vitals:   06/16/19 1007  BP: (!) 182/117  Pulse: 85  Resp: 14  Temp: 97.9 F (36.6 C)  TempSrc: Temporal  SpO2: 100%  Weight: (!) 306 lb (138.8 kg)  Height: 5\' 7"  (1.702 m)    Body mass  index is 47.93 kg/m.  General:  Alert and oriented, no acute distress HEENT: Normal Neck: No JVD Pulmonary: Clear to auscultation bilaterally Cardiac: Regular Rate and Rhythm  Abdomen: Soft, non-tender, non-distended, no mass, obese, no bruit Skin: No rash Extremity Pulses:  2+ radial, brachial, femoral pulses bilaterally Musculoskeletal: No deformity or edema  Neurologic: Upper and lower extremity motor 5/5 and symmetric  DATA:  Patient had a renal duplex exam today which showed patency of the renal arteries bilaterally.  Review of prior imaging procedures had shown that the left kidney is 10 cm in length compared to the right 13 cm in length unfortunately due to the patient's body habitus we were not able to visualize the renal origins today.  I reviewed and interpreted the study.  ASSESSMENT: Possible renal artery stenosis in a patient with CKD5 and poorly controlled blood pressure   PLAN: In light of the patient's elevated creatinine we will obtain an MRA to see if he has significant renal artery stenosis.  He will follow-up after his MRA scan.   Ruta Hinds, MD [facility] Office: [phone] Pager: [phone]

## 2019-06-20 ENCOUNTER — Telehealth: Payer: Self-pay | Admitting: Cardiology

## 2019-06-20 ENCOUNTER — Telehealth: Payer: Self-pay

## 2019-06-20 DIAGNOSIS — Z79899 Other long term (current) drug therapy: Secondary | ICD-10-CM | POA: Diagnosis not present

## 2019-06-20 DIAGNOSIS — I129 Hypertensive chronic kidney disease with stage 1 through stage 4 chronic kidney disease, or unspecified chronic kidney disease: Secondary | ICD-10-CM | POA: Diagnosis not present

## 2019-06-20 DIAGNOSIS — I5033 Acute on chronic diastolic (congestive) heart failure: Secondary | ICD-10-CM | POA: Diagnosis not present

## 2019-06-20 DIAGNOSIS — N184 Chronic kidney disease, stage 4 (severe): Secondary | ICD-10-CM | POA: Diagnosis not present

## 2019-06-20 NOTE — Telephone Encounter (Signed)
Returned pt call. No answer. Unable to leave msg.  

## 2019-06-20 NOTE — Telephone Encounter (Signed)
Patient is having increased swelling and pain in lower extremities / tg

## 2019-06-20 NOTE — Telephone Encounter (Signed)
Pts wife called and said that she he is having bilateral swelling in his feet and legs and he has no energy. Very fatigued. She states that she has called cards as well and advised. Given his history of CHF and how she says he is feeling I advised he go to the ER for evaluation.   York Cerise, CMA

## 2019-06-21 ENCOUNTER — Other Ambulatory Visit: Payer: Self-pay | Admitting: Vascular Surgery

## 2019-06-21 NOTE — Telephone Encounter (Signed)
    At the time of his last visit in 04/2019, he was taking Torsemide 60mg  in AM/40mg  in PM. Did he self-reduce this or was it reduced by Nephrology? Given his Stage 5 CKD and creatinine of 5.28, I would recommend following Nephrology's recommendations in regards to his diuretic dose. If symptoms do not improve with diuretic adjustment, would recommend ED evaluation given his associated discoloration and pain.   Signed, Erma Heritage, PA-C 06/21/2019, 12:44 PM Pager: [phone]

## 2019-06-21 NOTE — Telephone Encounter (Signed)
Returned call -- please call [phone]

## 2019-06-21 NOTE — Telephone Encounter (Signed)
Called pt. No answer. Unable to leave message.  

## 2019-06-21 NOTE — Telephone Encounter (Signed)
Returned pt call. Informed him of recommendations. He voiced understanding.  

## 2019-06-21 NOTE — Telephone Encounter (Signed)
Returned pt call. He complains of worsening swelling of ankles and lower legs. He does not have any complaints of SOB or weight gain. He said his legs are very red, hot to touch and stinging. They are not weeping. He stated he is currently taking torsemide 20 mg- three times daily. He stated he has only been eating salads and drinking water recently. Please advise.

## 2019-06-22 ENCOUNTER — Inpatient Hospital Stay (HOSPITAL_COMMUNITY)
Admission: EM | Admit: 2019-06-22 | Discharge: 2019-06-27 | DRG: 291 | Disposition: A | Discharge status: Home / Self Care | Payer: Medicare Other | Attending: Internal Medicine | Admitting: Internal Medicine

## 2019-06-22 ENCOUNTER — Other Ambulatory Visit: Payer: Self-pay

## 2019-06-22 ENCOUNTER — Encounter (HOSPITAL_COMMUNITY): Payer: Self-pay

## 2019-06-22 ENCOUNTER — Inpatient Hospital Stay (HOSPITAL_COMMUNITY): Payer: Medicare Other

## 2019-06-22 ENCOUNTER — Emergency Department (HOSPITAL_COMMUNITY): Payer: Medicare Other

## 2019-06-22 DIAGNOSIS — R0989 Other specified symptoms and signs involving the circulatory and respiratory systems: Secondary | ICD-10-CM | POA: Diagnosis not present

## 2019-06-22 DIAGNOSIS — R600 Localized edema: Secondary | ICD-10-CM

## 2019-06-22 DIAGNOSIS — N185 Chronic kidney disease, stage 5: Secondary | ICD-10-CM | POA: Diagnosis present

## 2019-06-22 DIAGNOSIS — N184 Chronic kidney disease, stage 4 (severe): Secondary | ICD-10-CM | POA: Diagnosis not present

## 2019-06-22 DIAGNOSIS — R601 Generalized edema: Secondary | ICD-10-CM | POA: Diagnosis not present

## 2019-06-22 DIAGNOSIS — E1122 Type 2 diabetes mellitus with diabetic chronic kidney disease: Secondary | ICD-10-CM | POA: Diagnosis present

## 2019-06-22 DIAGNOSIS — E876 Hypokalemia: Secondary | ICD-10-CM | POA: Diagnosis not present

## 2019-06-22 DIAGNOSIS — I5031 Acute diastolic (congestive) heart failure: Secondary | ICD-10-CM | POA: Diagnosis not present

## 2019-06-22 DIAGNOSIS — I10 Essential (primary) hypertension: Secondary | ICD-10-CM | POA: Diagnosis present

## 2019-06-22 DIAGNOSIS — I130 Hypertensive heart and chronic kidney disease with heart failure and stage 1 through stage 4 chronic kidney disease, or unspecified chronic kidney disease: Secondary | ICD-10-CM | POA: Diagnosis not present

## 2019-06-22 DIAGNOSIS — Z6841 Body Mass Index (BMI) 40.0 and over, adult: Secondary | ICD-10-CM | POA: Diagnosis not present

## 2019-06-22 DIAGNOSIS — R9431 Abnormal electrocardiogram [ECG] [EKG]: Secondary | ICD-10-CM | POA: Diagnosis present

## 2019-06-22 DIAGNOSIS — Z8249 Family history of ischemic heart disease and other diseases of the circulatory system: Secondary | ICD-10-CM | POA: Diagnosis not present

## 2019-06-22 DIAGNOSIS — Z825 Family history of asthma and other chronic lower respiratory diseases: Secondary | ICD-10-CM | POA: Diagnosis not present

## 2019-06-22 DIAGNOSIS — Z833 Family history of diabetes mellitus: Secondary | ICD-10-CM

## 2019-06-22 DIAGNOSIS — Z79899 Other long term (current) drug therapy: Secondary | ICD-10-CM | POA: Diagnosis not present

## 2019-06-22 DIAGNOSIS — Z7951 Long term (current) use of inhaled steroids: Secondary | ICD-10-CM

## 2019-06-22 DIAGNOSIS — N269 Renal sclerosis, unspecified: Secondary | ICD-10-CM | POA: Diagnosis present

## 2019-06-22 DIAGNOSIS — D631 Anemia in chronic kidney disease: Secondary | ICD-10-CM | POA: Diagnosis present

## 2019-06-22 DIAGNOSIS — I5033 Acute on chronic diastolic (congestive) heart failure: Secondary | ICD-10-CM | POA: Diagnosis present

## 2019-06-22 DIAGNOSIS — Z800 Family history of malignant neoplasm of digestive organs: Secondary | ICD-10-CM

## 2019-06-22 DIAGNOSIS — M109 Gout, unspecified: Secondary | ICD-10-CM | POA: Diagnosis present

## 2019-06-22 DIAGNOSIS — M79604 Pain in right leg: Secondary | ICD-10-CM | POA: Diagnosis not present

## 2019-06-22 DIAGNOSIS — Z794 Long term (current) use of insulin: Secondary | ICD-10-CM | POA: Diagnosis not present

## 2019-06-22 DIAGNOSIS — N179 Acute kidney failure, unspecified: Secondary | ICD-10-CM

## 2019-06-22 DIAGNOSIS — K760 Fatty (change of) liver, not elsewhere classified: Secondary | ICD-10-CM | POA: Diagnosis present

## 2019-06-22 DIAGNOSIS — I509 Heart failure, unspecified: Secondary | ICD-10-CM | POA: Diagnosis not present

## 2019-06-22 DIAGNOSIS — Z888 Allergy status to other drugs, medicaments and biological substances status: Secondary | ICD-10-CM

## 2019-06-22 DIAGNOSIS — E1165 Type 2 diabetes mellitus with hyperglycemia: Secondary | ICD-10-CM

## 2019-06-22 DIAGNOSIS — M79606 Pain in leg, unspecified: Secondary | ICD-10-CM

## 2019-06-22 DIAGNOSIS — E669 Obesity, unspecified: Secondary | ICD-10-CM | POA: Diagnosis present

## 2019-06-22 DIAGNOSIS — E785 Hyperlipidemia, unspecified: Secondary | ICD-10-CM | POA: Diagnosis present

## 2019-06-22 DIAGNOSIS — N049 Nephrotic syndrome with unspecified morphologic changes: Secondary | ICD-10-CM | POA: Diagnosis present

## 2019-06-22 DIAGNOSIS — Z20828 Contact with and (suspected) exposure to other viral communicable diseases: Secondary | ICD-10-CM | POA: Diagnosis present

## 2019-06-22 DIAGNOSIS — M7989 Other specified soft tissue disorders: Secondary | ICD-10-CM | POA: Diagnosis not present

## 2019-06-22 LAB — HEPATIC FUNCTION PANEL
ALT: 21 U/L (ref 0–44)
AST: 21 U/L (ref 15–41)
Albumin: 3.1 g/dL — ABNORMAL LOW (ref 3.5–5.0)
Alkaline Phosphatase: 107 U/L (ref 38–126)
Bilirubin, Direct: <0.1 mg/dL (ref 0.0–0.2)
Total Bilirubin: 0.3 mg/dL (ref 0.3–1.2)
Total Protein: 6.9 g/dL (ref 6.5–8.1)

## 2019-06-22 LAB — CBC WITH DIFFERENTIAL/PLATELET
Abs Immature Granulocytes: 0.01 10*3/uL (ref 0.00–0.07)
Basophils Absolute: 0.1 10*3/uL (ref 0.0–0.1)
Basophils Relative: 1 %
Eosinophils Absolute: 0.7 10*3/uL — ABNORMAL HIGH (ref 0.0–0.5)
Eosinophils Relative: 10 %
HCT: 35 % — ABNORMAL LOW (ref 39.0–52.0)
Hemoglobin: 11 g/dL — ABNORMAL LOW (ref 13.0–17.0)
Immature Granulocytes: 0 %
Lymphocytes Relative: 43 %
Lymphs Abs: 3.1 10*3/uL (ref 0.7–4.0)
MCH: 24.1 pg — ABNORMAL LOW (ref 26.0–34.0)
MCHC: 31.4 g/dL (ref 30.0–36.0)
MCV: 76.6 fL — ABNORMAL LOW (ref 80.0–100.0)
Monocytes Absolute: 0.9 10*3/uL (ref 0.1–1.0)
Monocytes Relative: 12 %
Neutro Abs: 2.4 10*3/uL (ref 1.7–7.7)
Neutrophils Relative %: 34 %
Platelets: 290 10*3/uL (ref 150–400)
RBC: 4.57 MIL/uL (ref 4.22–5.81)
RDW: 15.1 % (ref 11.5–15.5)
WBC: 7.2 10*3/uL (ref 4.0–10.5)
nRBC: 0 % (ref 0.0–0.2)

## 2019-06-22 LAB — BASIC METABOLIC PANEL
Anion gap: 11 (ref 5–15)
BUN: 61 mg/dL — ABNORMAL HIGH (ref 6–20)
CO2: 25 mmol/L (ref 22–32)
Calcium: 7.1 mg/dL — ABNORMAL LOW (ref 8.9–10.3)
Chloride: 101 mmol/L (ref 98–111)
Creatinine, Ser: 4.87 mg/dL — ABNORMAL HIGH (ref 0.61–1.24)
GFR calc Af Amer: 15 mL/min — ABNORMAL LOW (ref >60)
GFR calc non Af Amer: 13 mL/min — ABNORMAL LOW (ref >60)
Glucose, Bld: 224 mg/dL — ABNORMAL HIGH (ref 70–99)
Potassium: 3 mmol/L — ABNORMAL LOW (ref 3.5–5.1)
Sodium: 137 mmol/L (ref 135–145)

## 2019-06-22 LAB — BRAIN NATRIURETIC PEPTIDE: B Natriuretic Peptide: 119 pg/mL — ABNORMAL HIGH (ref 0.0–100.0)

## 2019-06-22 LAB — HEMOGLOBIN A1C
Hgb A1c MFr Bld: 12.4 % — ABNORMAL HIGH (ref 4.8–5.6)
Mean Plasma Glucose: 309.18 mg/dL

## 2019-06-22 LAB — URINALYSIS, ROUTINE W REFLEX MICROSCOPIC
Bilirubin Urine: NEGATIVE
Glucose, UA: 150 mg/dL — AB
Ketones, ur: NEGATIVE mg/dL
Leukocytes,Ua: NEGATIVE
Nitrite: NEGATIVE
Protein, ur: 100 mg/dL — AB
Specific Gravity, Urine: 1.012 (ref 1.005–1.030)
pH: 6 (ref 5.0–8.0)

## 2019-06-22 LAB — GLUCOSE, CAPILLARY: Glucose-Capillary: 361 mg/dL — ABNORMAL HIGH (ref 70–99)

## 2019-06-22 LAB — SARS CORONAVIRUS 2 (TAT 6-24 HRS): SARS Coronavirus 2: NEGATIVE

## 2019-06-22 MED ORDER — HEPARIN SODIUM (PORCINE) 5000 UNIT/ML IJ SOLN
5000.0000 [IU] | Freq: Three times a day (TID) | INTRAMUSCULAR | Status: DC
  Administered 2019-06-22 – 2019-06-24 (×7): 5000 [IU] via SUBCUTANEOUS
  Filled 2019-06-22 (×8): qty 1

## 2019-06-22 MED ORDER — ATORVASTATIN CALCIUM 40 MG PO TABS: 80.0000 mg | ORAL_TABLET | Freq: Every day | ORAL | Status: DC

## 2019-06-22 MED ORDER — ACETAMINOPHEN 650 MG RE SUPP: 650.0000 mg | Freq: Four times a day (QID) | RECTAL | Status: DC | PRN

## 2019-06-22 MED ORDER — POTASSIUM CHLORIDE CRYS ER 20 MEQ PO TBCR
20.0000 meq | EXTENDED_RELEASE_TABLET | Freq: Every day | ORAL | Status: DC
  Administered 2019-06-22 – 2019-06-27 (×6): 20 meq via ORAL
  Filled 2019-06-22 (×6): qty 1

## 2019-06-22 MED ORDER — METOPROLOL TARTRATE 5 MG/5ML IV SOLN
5.0000 mg | Freq: Once | INTRAVENOUS | Status: AC
  Administered 2019-06-22: 5 mg via INTRAVENOUS
  Filled 2019-06-22: qty 5

## 2019-06-22 MED ORDER — CLONIDINE HCL 0.2 MG PO TABS
0.3000 mg | ORAL_TABLET | Freq: Three times a day (TID) | ORAL | Status: DC
  Administered 2019-06-22 – 2019-06-27 (×14): 0.3 mg via ORAL
  Filled 2019-06-22 (×14): qty 1

## 2019-06-22 MED ORDER — ACETAMINOPHEN 325 MG PO TABS: 650.0000 mg | ORAL_TABLET | Freq: Four times a day (QID) | ORAL | Status: DC | PRN

## 2019-06-22 MED ORDER — AMLODIPINE BESYLATE 5 MG PO TABS
10.0000 mg | ORAL_TABLET | Freq: Every day | ORAL | Status: DC
  Administered 2019-06-22 – 2019-06-27 (×6): 10 mg via ORAL
  Filled 2019-06-22 (×6): qty 2

## 2019-06-22 MED ORDER — GABAPENTIN 100 MG PO CAPS
100.0000 mg | ORAL_CAPSULE | Freq: Three times a day (TID) | ORAL | Status: DC
  Administered 2019-06-22 – 2019-06-27 (×14): 100 mg via ORAL
  Filled 2019-06-22 (×14): qty 1

## 2019-06-22 MED ORDER — FUROSEMIDE 10 MG/ML IJ SOLN
80.0000 mg | Freq: Two times a day (BID) | INTRAMUSCULAR | Status: DC
  Administered 2019-06-22 – 2019-06-23 (×2): 80 mg via INTRAVENOUS
  Filled 2019-06-22 (×2): qty 8

## 2019-06-22 MED ORDER — HYDROCODONE-ACETAMINOPHEN 5-325 MG PO TABS
1.0000 | ORAL_TABLET | Freq: Four times a day (QID) | ORAL | Status: DC | PRN
  Administered 2019-06-22 – 2019-06-24 (×4): 1 via ORAL
  Filled 2019-06-22 (×4): qty 1

## 2019-06-22 MED ORDER — SODIUM BICARBONATE 650 MG PO TABS
650.0000 mg | ORAL_TABLET | Freq: Two times a day (BID) | ORAL | Status: DC
  Administered 2019-06-22 – 2019-06-25 (×6): 650 mg via ORAL
  Filled 2019-06-22 (×6): qty 1

## 2019-06-22 MED ORDER — FUROSEMIDE 10 MG/ML IJ SOLN
80.0000 mg | Freq: Once | INTRAMUSCULAR | Status: AC
  Administered 2019-06-22: 80 mg via INTRAVENOUS
  Filled 2019-06-22: qty 8

## 2019-06-22 MED ORDER — LINACLOTIDE 145 MCG PO CAPS
145.0000 ug | ORAL_CAPSULE | Freq: Every day | ORAL | Status: DC
  Administered 2019-06-23 – 2019-06-27 (×5): 145 ug via ORAL
  Filled 2019-06-22 (×6): qty 1

## 2019-06-22 MED ORDER — ONDANSETRON HCL 4 MG PO TABS: 4.0000 mg | ORAL_TABLET | Freq: Four times a day (QID) | ORAL | Status: DC | PRN

## 2019-06-22 MED ORDER — ATORVASTATIN CALCIUM 40 MG PO TABS
80.0000 mg | ORAL_TABLET | Freq: Every day | ORAL | Status: DC
  Administered 2019-06-23 – 2019-06-27 (×5): 80 mg via ORAL
  Filled 2019-06-22 (×5): qty 2

## 2019-06-22 MED ORDER — POTASSIUM CHLORIDE CRYS ER 20 MEQ PO TBCR
40.0000 meq | EXTENDED_RELEASE_TABLET | Freq: Once | ORAL | Status: AC
  Administered 2019-06-22: 40 meq via ORAL
  Filled 2019-06-22: qty 2

## 2019-06-22 MED ORDER — LABETALOL HCL 200 MG PO TABS
200.0000 mg | ORAL_TABLET | Freq: Two times a day (BID) | ORAL | Status: DC
  Administered 2019-06-22 – 2019-06-23 (×3): 200 mg via ORAL
  Filled 2019-06-22 (×3): qty 1

## 2019-06-22 MED ORDER — EZETIMIBE 10 MG PO TABS
10.0000 mg | ORAL_TABLET | Freq: Every evening | ORAL | Status: DC
  Administered 2019-06-22 – 2019-06-26 (×5): 10 mg via ORAL
  Filled 2019-06-22 (×5): qty 1

## 2019-06-22 MED ORDER — INSULIN ASPART 100 UNIT/ML ~~LOC~~ SOLN
0.0000 [IU] | Freq: Every day | SUBCUTANEOUS | Status: DC
  Administered 2019-06-22: 5 [IU] via SUBCUTANEOUS
  Administered 2019-06-23: 4 [IU] via SUBCUTANEOUS

## 2019-06-22 MED ORDER — INSULIN ASPART 100 UNIT/ML ~~LOC~~ SOLN
0.0000 [IU] | Freq: Three times a day (TID) | SUBCUTANEOUS | Status: DC
  Administered 2019-06-23: 15 [IU] via SUBCUTANEOUS
  Administered 2019-06-23: 5 [IU] via SUBCUTANEOUS
  Administered 2019-06-23 – 2019-06-24 (×2): 11 [IU] via SUBCUTANEOUS
  Administered 2019-06-24: 5 [IU] via SUBCUTANEOUS

## 2019-06-22 MED ORDER — INSULIN DETEMIR 100 UNIT/ML ~~LOC~~ SOLN
20.0000 [IU] | Freq: Two times a day (BID) | SUBCUTANEOUS | Status: DC
  Administered 2019-06-22 – 2019-06-24 (×4): 20 [IU] via SUBCUTANEOUS
  Filled 2019-06-22 (×10): qty 0.2

## 2019-06-22 MED ORDER — ONDANSETRON HCL 4 MG/2ML IJ SOLN: 4.0000 mg | Freq: Four times a day (QID) | INTRAMUSCULAR | Status: DC | PRN

## 2019-06-22 NOTE — ED Triage Notes (Signed)
Pt reports has CHF and has had pain and swelling in both feet.  Denies sob or chest pain.

## 2019-06-22 NOTE — Progress Notes (Signed)
notified on call midlevel of pt requesting pain medication for BLE pain. Will await orders

## 2019-06-22 NOTE — ED Notes (Signed)
ED Provider at bedside. 

## 2019-06-22 NOTE — ED Provider Notes (Signed)
[facility] EMERGENCY DEPARTMENT Provider Note   CSN: [id number] Arrival date & time: 06/22/19  [id number]     History   Chief Complaint Chief Complaint  Patient presents with  . Foot Swelling    HPI Kelly Key is a 48 y.o. male with PMHx CHF with EF 60-65%, Diabetes, HTN, HLD, CKD stage IV who presents to the ED today complaining of bilateral lower extremity swelling and pain that began 3 to 4 days ago.  States that he has been compliant with his torsemide.  He reports that he takes 60 mg in the morning and 40 mg at night and has not missed any doses.  She also complains of some mild shortness of breath and abdominal bloating.  States he has been taking Tylenol for his pain without relief.  Patient recently saw [name] with vascular surgery on 8/20 for work-up of his renal dysfunction with concern for renal artery stenosis and continued issues with his blood pressure despite taking lidocaine, clonidine, labetalol, torsemide.  Had a renal duplex at that time which showed patency of the renal arteries bilaterally.  Patient has a MRA scheduled to further evaluate this.  She called his cardiology office yesterday to discuss his bilateral lower extremity swelling.  There was some confusion about whether he is taking the correct dosage of his torsemide.  He had told the CMA that he was taking 20 mg 3 times a day but today states that he has been taking 60 in the morning and 40 at night.  He states he has been eating only salads and drinking water recently and trying to watch his weight but states he has probably gained about 4 pounds in the past couple of days.  Denies fever, chills, chest pain, redness, drainage, any other associated symptoms.  No history of DVT/PE in the past.  Patient is not anticoagulated.  No recent prolonged travel or immobilization.        Past Medical History:  Diagnosis Date  . Acute diastolic CHF (congestive heart failure) ([place]) 06/11/2012   EF 50-55% [facility])   . Diabetes mellitus    A1c 11.5 (06/11/2012).  . Gout   . Hepatic steatosis 06/11/2012   Elevated LFTs  . Hyperlipemia   . Malignant hypertension   . Microcytic anemia 06/12/2012  . Obesity     Patient Active Problem List   Diagnosis Date Noted  . Acute on chronic diastolic congestive heart failure ([place]) 05/05/2019  . Thromboembolism ([place]) 02/20/2019  . AKI (acute kidney injury) ([place]) 02/20/2019  . CHF exacerbation ([place]) 03/26/2018  . Acute on chronic diastolic CHF (congestive heart failure) ([place]) 03/26/2018  . Acute respiratory failure with hypoxia ([place]) 03/26/2018  . Diabetes mellitus ([place]) 06/12/2012  . Hyponatremia 06/12/2012  . Microcytic anemia 06/12/2012  . Acute diastolic CHF (congestive heart failure) ([place]) 06/11/2012  . HTN (hypertension) 06/11/2012  . Elevated LFTs 06/11/2012  . Morbid obesity ([place]) 06/11/2012  . Hepatic steatosis 06/11/2012    Past Surgical History:  Procedure Laterality Date  . NO PAST SURGERIES          Home Medications    Prior to Admission medications   Medication Sig Start Date End Date Taking? Authorizing Provider  albuterol (VENTOLIN HFA) 108 (90 Base) MCG/ACT inhaler Inhale 2 puffs into the lungs every 4 (four) hours as needed. 03/24/19  Yes [provider]  amLODipine (NORVASC) 10 MG tablet Take 10 mg by mouth daily.   Yes [provider]  atorvastatin (LIPITOR)  80 MG tablet Take 1 tablet by mouth daily. 06/06/19  Yes [provider]  cloNIDine (CATAPRES) 0.3 MG tablet Take 1 tablet (0.3 mg total) by mouth 3 (three) times daily. 04/04/18 06/22/19 Yes Shah, Pratik D, DO  COLCRYS 0.6 MG tablet Take 0.6 mg by mouth 2 (two) times daily. 03/22/19  Yes [provider]  ergocalciferol (VITAMIN D2) 1.25 MG (50000 UT) capsule Take 50,000 Units by mouth once a week.   Yes [provider]  ezetimibe (ZETIA) 10 MG tablet Take 10 mg by mouth every evening. 01/20/19  Yes [provider]  fluticasone  (FLONASE) 50 MCG/ACT nasal spray Place 1 spray into both nostrils daily. 04/04/18 06/22/19 Yes Shah, Pratik D, DO  gabapentin (NEURONTIN) 100 MG capsule Take 1 capsule by mouth 3 (three) times daily. 05/12/19  Yes [provider]  insulin aspart (NOVOLOG) 100 UNIT/ML injection Inject 5-15 Units into the skin 3 (three) times daily before meals.    Yes [provider]  insulin detemir (LEVEMIR) 100 UNIT/ML injection Inject 0.15-0.45 mLs (15-45 Units total) into the skin 2 (two) times daily. Patient takes 15 units in the morning and 45 units in the evening 02/23/19  Yes Johnson, Clanford L, MD  Insulin Pen Needle 31G X 5 MM MISC Use as directed. 04/04/18  Yes Shah, Pratik D, DO  labetalol (NORMODYNE) 200 MG tablet Take 200 mg by mouth 2 (two) times daily.   Yes [provider]  linaclotide Rolan Lipa) 145 MCG CAPS capsule Take 1 capsule (145 mcg total) by mouth daily before breakfast. 04/05/18  Yes Manuella Ghazi, Pratik D, DO  potassium chloride (MICRO-K) 10 MEQ CR capsule Take 10 mEq by mouth daily. 04/21/19  Yes [provider]  potassium chloride SA (K-DUR) 20 MEQ tablet Take 1 tablet (20 mEq total) by mouth daily. 02/28/19  Yes Johnson, Clanford L, MD  sodium bicarbonate 650 MG tablet Take 650 mg by mouth 2 (two) times daily.   Yes [provider]  torsemide (DEMADEX) 20 MG tablet TAKE 60 MG IN THE MORNING AND 40 MG IN THE EVENING Patient taking differently: Take 20 mg by mouth 3 (three) times daily.  04/18/19  Yes BranchAlphonse Guild, MD  HYDROcodone-acetaminophen (NORCO/VICODIN) 5-325 MG tablet Take 1 tablet by mouth every 6 (six) hours as needed. Patient not taking: Reported on 06/22/2019 04/28/19   Fredia Sorrow, MD    Family History Family History  Problem Relation Age of Onset  . Gout Mother   . Asthma Mother   . Diabetes Father   . Heart failure Father   . Diabetes Sister   . Hypertension Brother   . Pancreatic cancer Brother   . Diabetes Sister     Social  History Social History   Tobacco Use  . Smoking status: Never Smoker  . Smokeless tobacco: Never Used  Substance Use Topics  . Alcohol use: No    Comment: haven't drank in over 5 years   . Drug use: No     Allergies   Hydralazine and Lisinopril   Review of Systems Review of Systems  Constitutional: Negative for chills and fever.  HENT: Negative for congestion.   Eyes: Negative for visual disturbance.  Respiratory: Positive for shortness of breath. Negative for cough.   Cardiovascular: Negative for chest pain.  Gastrointestinal: Positive for abdominal distention. Negative for abdominal pain, constipation, diarrhea, nausea and vomiting.  Genitourinary: Negative for difficulty urinating.  Musculoskeletal: Positive for arthralgias and joint swelling. Negative for myalgias.  Skin:  Negative for color change and rash.  Neurological: Negative for headaches.     Physical Exam Updated Vital Signs BP (!) 196/111 (BP Location: Left Arm)   Pulse 84   Temp 97.7 F (36.5 C) (Oral)   Resp 16   Ht 5\' 7"  (1.702 m)   Wt (!) 140.6 kg   SpO2 100%   BMI 48.55 kg/m   Physical Exam Vitals signs and nursing note reviewed.  Constitutional:      Appearance: He is obese. He is not ill-appearing.  HENT:     Head: Normocephalic and atraumatic.  Eyes:     Conjunctiva/sclera: Conjunctivae normal.     Pupils: Pupils are equal, round, and reactive to light.  Neck:     Musculoskeletal: Neck supple.  Cardiovascular:     Rate and Rhythm: Normal rate and regular rhythm.  Pulmonary:     Effort: Pulmonary effort is normal.     Breath sounds: Rales present. No wheezing or rhonchi.  Abdominal:     General: There is distension.     Palpations: Abdomen is soft.     Tenderness: There is no abdominal tenderness. There is no guarding or rebound.  Musculoskeletal:     Comments: 3+ pitting edema bilaterally to LEs with tenderness to palpation diffusely. No overlying skin changes including erythema,  increased warmth, or drainage. Palpable DP pulses bilaterally.   Skin:    General: Skin is warm and dry.  Neurological:     Mental Status: He is alert.      ED Treatments / Results  Labs (all labs ordered are listed, but only abnormal results are displayed) Labs Reviewed  BASIC METABOLIC PANEL - Abnormal; Notable for the following components:      Result Value   Potassium 3.0 (*)    Glucose, Bld 224 (*)    BUN 61 (*)    Creatinine, Ser 4.87 (*)    Calcium 7.1 (*)    GFR calc non Af Amer 13 (*)    GFR calc Af Amer 15 (*)    All other components within normal limits  BRAIN NATRIURETIC PEPTIDE - Abnormal; Notable for the following components:   B Natriuretic Peptide 119.0 (*)    All other components within normal limits  CBC WITH DIFFERENTIAL/PLATELET - Abnormal; Notable for the following components:   Hemoglobin 11.0 (*)    HCT 35.0 (*)    MCV 76.6 (*)    MCH 24.1 (*)    Eosinophils Absolute 0.7 (*)    All other components within normal limits  HEPATIC FUNCTION PANEL - Abnormal; Notable for the following components:   Albumin 3.1 (*)    All other components within normal limits  SARS CORONAVIRUS 2 (TAT 6-12 HRS)  URINALYSIS, ROUTINE W REFLEX MICROSCOPIC    EKG EKG Interpretation  Date/Time:  Wednesday June 22 2019 10:36:27 EDT Ventricular Rate:  89 PR Interval:    QRS Duration: 111 QT Interval:  442 QTC Calculation: 538 R Axis:   -46 Text Interpretation:  Sinus rhythm Prolonged PR interval LAD, consider left anterior fascicular block Probable anteroseptal infarct, old Prolonged QT interval No STEMI  Confirmed by Nanda Quinton [phone]) on 06/22/2019 10:59:20 AM   Radiology Dg Chest Port 1 View  Result Date: 06/22/2019 CLINICAL DATA:  Pt c/o swelling in lower extremities x several days. Hx CHF, obesity, diabetes, nonsmoker EXAM: PORTABLE CHEST 1 VIEW COMPARISON:  Chest radiographs dated 04/28/2019, 02/23/2019 FINDINGS: Stable cardiomediastinal contours with enlarged  heart size. Central vascular congestion without  overt edema. No pneumothorax or pleural effusion. Visualized skeleton is unremarkable. IMPRESSION: Cardiomegaly and central vascular congestion without overt edema. Electronically Signed   By: Audie Pinto M.D.   On: 06/22/2019 10:53    Procedures Procedures (including critical care time)  Medications Ordered in ED Medications  potassium chloride SA (K-DUR) CR tablet 40 mEq (40 mEq Oral Given 06/22/19 1148)  furosemide (LASIX) injection 80 mg (80 mg Intravenous Given 06/22/19 1223)     Initial Impression / Assessment and Plan / ED Course  I have reviewed the triage vital signs and the nursing notes.  Pertinent labs & imaging results that were available during my care of the patient were reviewed by me and considered in my medical decision making (see chart for details).  Clinical Course as of Jun 21 1237  Wed Jun 22, 2019  1139 Will replete today  Potassium(!): 3.0 [MV]  1139 Consistent with pt's baseline  Creatinine(!): 4.87 [MV]    Clinical Course User Index [MV] Eustaquio Maize, PA-C   48 year old morbidly obese male who presents with bilateral lower extremity swelling and pain for the past few days.  Patient does have a history of CHF is currently on 60 mg torsemide in the morning and 40 mg at night although there is some question about whether he is taking this as prescribed.  Has malignant hypertension despite taking multiple medications.  He is currently scheduled to have an MRA done to evaluate for renal artery stenosis.  She reports that his cardiologist wanted to increase his torsemide but they are waiting for the MRI to be done because they do not want to injure his kidneys anymore.  Been multiple discussions with patient regarding need for dialysis but he is not interested.   Pressure in the ED today 196/111.  She has 3+ bilateral pitting edema and appears to be volume overloaded in his abdomen as well.  Will obtain baseline  blood work today including CBC, BMP, BNP, chest x-ray.  Patient has no history of DVT/PE.  Do not feel he needs ultrasound today given he has bilateral extremity swelling that is most consistent with fluid overload.  No overlying skin changes to suggest cellulitis.  Patient is afebrile in the ED without tachycardia or tachypnea.   No leukocytosis today.  Hemoglobin stable compared to baseline.  Hypokalemic at 3.0.  Will replete.  Creatinine elevated at 4.87.  With GFR 15.  BNP 119.  This appears to be around patient's baseline.  Chest x-ray does show some vascular congestion without obvious edema.  This case with attending physician Dr. Laverta Baltimore who suggests consulting cardiology at this time.  It appears that they have been 10 to increase patient's torsemide given his kidney function.  Unsure if patient needs to come into the hospital for questionable IV Lasix/get his MRA abdomen done while he is here.  Will speak with cardiology on call for further recommendations.   Lab Results  Component Value Date   CREATININE 4.87 (H) 06/22/2019   CREATININE 5.28 (H) 05/05/2019   CREATININE 4.93 (H) 04/28/2019   11:57 AM Discussed case with Dr. Bronson Ing with cardiology.  He suggests giving nephrology a call given patient's CKD their hesitancy to increase the Lasix given his current creatinine level.  Patient has not seen nephrology since March.  Will call for further recommendations at this time.  We will hold off on IV Lasix currently.   12:15 PM Cussed case with Dr. Marval Regal with nephrology.  He suggest starting 80 mg IV  Lasix and admitting patient given he has failed outpatient fluid management.  Discussed case with hospitalist for admission.   12:37 PM Discussed case with Dr. Carles Collet with hospitalist team who agrees to evaluate patient for admission.   This note was prepared using Dragon voice recognition software and may include unintentional dictation errors due to the inherent limitations of voice  recognition software.      Final Clinical Impressions(s) / ED Diagnoses   Final diagnoses:  Prolonged Q-T interval on ECG  Hypokalemia  CKD (chronic kidney disease), stage IV (HCC)  Essential hypertension  Bilateral leg edema  Acute on chronic congestive heart failure, unspecified heart failure type [facility])    ED Discharge Orders    None       Eustaquio Maize, PA-C 06/22/19 1621    Long, Wonda Olds, MD 06/22/19 1818

## 2019-06-22 NOTE — H&P (Signed)
History and Physical  Kelly Key [id number] DOB: 1971-08-14 DOA: 06/22/2019   PCP: [name], MD   Patient coming from: Home  Chief Complaint: swelling in legs  HPI:  Kelly Key is a 48 y.o. male with medical history of hypertension, CKD stage IV, diastolic CHF, dyslipidemia, and gouty arthritis presenting with 2 to 3-week history of increasing lower extremity edema and increasing abdominal girth.  He states that his symptoms have worsened in the past 2 to 3 days.  He denies any fevers, chills, chest pain, shortness breath, cough, hemoptysis, nausea, vomiting, diarrhea, abdominal pain, dysuria, hematuria.  He endorses compliance with his torsemide which he takes 60 mg in the a.m. and 40 mg in the p.m.  He states that he has been on this dose for the last 2 months.  He denies drinking any excess fluid or other dietary indiscretions.  Because of his increasing leg heaviness, the patient states that he has been having difficulty getting around with leg weakness.  He denies any back pain or recent falls.  He denies any NSAID use.  The patient recently had a renal artery ultrasound duplex which was negative for renal artery stenosis.  He was supposed to be scheduled for an MRI of his renal arteries in the future by Dr. Juanda Crumble fields. In the emergency department, the patient was afebrile hemodynamically stable saturating 100% on room air.  BMP showed a potassium of 3.0 and serum creatinine of 4.87.  LFTs were unremarkable.  WBC was 7.2.  Chest x-ray showed cardiomegaly with some vascular congestion.  Assessment/Plan: Fluid overload/acute on chronic renal failure--CKD stage IV -Suspect the patient likely has continued progression of his underlying kidney disease -Patient likely has underlying nephrotic syndrome and FSGS -Urine protein creatinine ratio -The patient had been adamant about not pursuing dialysis, but may be having a change of heart at this point -Nephrology consult -Previous  baseline creatinine 3.6-3.9 in April 2020 -Presented with serum creatinine 4.7 -Lasix IV 80 mg twice daily  Uncontrolled diabetes mellitus type 2 with hyperglycemia -Hemoglobin A1c -NovoLog sliding scale -Start reduced dose Levemir  Essential hypertension -Continue amlodipine, clonidine, labetalol  Hyperlipidemia -Continue statin  Leg edema/pain -venous duplex       Past Medical History:  Diagnosis Date  . Acute diastolic CHF (congestive heart failure) ([place]) 06/11/2012   EF 50-55% [facility])  . Diabetes mellitus    A1c 11.5 (06/11/2012).  . Gout   . Hepatic steatosis 06/11/2012   Elevated LFTs  . Hyperlipemia   . Malignant hypertension   . Microcytic anemia 06/12/2012  . Obesity    Past Surgical History:  Procedure Laterality Date  . NO PAST SURGERIES     Social History:  reports that he has never smoked. He has never used smokeless tobacco. He reports that he does not drink alcohol or use drugs.   Family History  Problem Relation Age of Onset  . Gout Mother   . Asthma Mother   . Diabetes Father   . Heart failure Father   . Diabetes Sister   . Hypertension Brother   . Pancreatic cancer Brother   . Diabetes Sister      Allergies  Allergen Reactions  . Hydralazine   . Lisinopril Swelling     Prior to Admission medications   Medication Sig Start Date End Date Taking? Authorizing Provider  albuterol (VENTOLIN HFA) 108 (90 Base) MCG/ACT inhaler Inhale 2 puffs into the lungs every 4 (four) hours  as needed. 03/24/19  Yes [provider]  amLODipine (NORVASC) 10 MG tablet Take 10 mg by mouth daily.   Yes [provider]  atorvastatin (LIPITOR) 80 MG tablet Take 1 tablet by mouth daily. 06/06/19  Yes [provider]  cloNIDine (CATAPRES) 0.3 MG tablet Take 1 tablet (0.3 mg total) by mouth 3 (three) times daily. 04/04/18 06/22/19 Yes Shah, Pratik D, DO  COLCRYS 0.6 MG tablet Take 0.6 mg by mouth 2 (two) times daily. 03/22/19  Yes  [provider]  ergocalciferol (VITAMIN D2) 1.25 MG (50000 UT) capsule Take 50,000 Units by mouth once a week.   Yes [provider]  ezetimibe (ZETIA) 10 MG tablet Take 10 mg by mouth every evening. 01/20/19  Yes [provider]  fluticasone (FLONASE) 50 MCG/ACT nasal spray Place 1 spray into both nostrils daily. 04/04/18 06/22/19 Yes Shah, Pratik D, DO  gabapentin (NEURONTIN) 100 MG capsule Take 1 capsule by mouth 3 (three) times daily. 05/12/19  Yes [provider]  insulin aspart (NOVOLOG) 100 UNIT/ML injection Inject 5-15 Units into the skin 3 (three) times daily before meals.    Yes [provider]  insulin detemir (LEVEMIR) 100 UNIT/ML injection Inject 0.15-0.45 mLs (15-45 Units total) into the skin 2 (two) times daily. Patient takes 15 units in the morning and 45 units in the evening 02/23/19  Yes Johnson, Clanford L, MD  Insulin Pen Needle 31G X 5 MM MISC Use as directed. 04/04/18  Yes Shah, Pratik D, DO  labetalol (NORMODYNE) 200 MG tablet Take 200 mg by mouth 2 (two) times daily.   Yes [provider]  linaclotide Rolan Lipa) 145 MCG CAPS capsule Take 1 capsule (145 mcg total) by mouth daily before breakfast. 04/05/18  Yes Manuella Ghazi, Pratik D, DO  potassium chloride (MICRO-K) 10 MEQ CR capsule Take 10 mEq by mouth daily. 04/21/19  Yes [provider]  potassium chloride SA (K-DUR) 20 MEQ tablet Take 1 tablet (20 mEq total) by mouth daily. 02/28/19  Yes Johnson, Clanford L, MD  sodium bicarbonate 650 MG tablet Take 650 mg by mouth 2 (two) times daily.   Yes [provider]  torsemide (DEMADEX) 20 MG tablet TAKE 60 MG IN THE MORNING AND 40 MG IN THE EVENING Patient taking differently: Take 20 mg by mouth 3 (three) times daily.  04/18/19  Yes BranchAlphonse Guild, MD  HYDROcodone-acetaminophen (NORCO/VICODIN) 5-325 MG tablet Take 1 tablet by mouth every 6 (six) hours as needed. Patient not taking: Reported on 06/22/2019 04/28/19   Fredia Sorrow, MD    Review of Systems:  Constitutional:  No weight loss, night sweats, Fevers, chills, fatigue.  Head&Eyes: No headache.  No vision loss.  No eye pain or scotoma ENT:  No Difficulty swallowing,Tooth/dental problems,Sore throat,  No ear ache, post nasal drip,  Cardio-vascular:  No chest pain, Orthopnea, PND,dizziness, palpitations  GI:  No  abdominal pain, nausea, vomiting, diarrhea, loss of appetite, hematochezia, melena, heartburn, indigestion, Resp:  No shortness of breath with exertion or at rest. No cough. No coughing up of blood .No wheezing.No chest wall deformity  Skin:  no rash or lesions.  GU:  no dysuria, change in color of urine, no urgency or frequency. No flank pain.  Musculoskeletal:  No joint pain or swelling. No decreased range of motion. No back pain.  Psych:  No change in mood or affect. No depression or anxiety. Neurologic: No headache, no dysesthesia, no focal weakness, no vision loss. No syncope  Physical  Exam: Vitals:   06/22/19 1030 06/22/19 1100 06/22/19 1130 06/22/19 1200  BP: (!) 187/126 (!) 172/102 (!) 178/102 (!) 165/106  Pulse: 80 83 84 80  Resp: 15 15 16 17   Temp:      TempSrc:      SpO2: 100% 99% 100% 98%  Weight:      Height:       General:  A&O x 3, NAD, nontoxic, pleasant/cooperative Head/Eye: No conjunctival hemorrhage, no icterus, [place]/AT, No nystagmus ENT:  No icterus,  No thrush, good dentition, no pharyngeal exudate Neck:  No masses, no lymphadenpathy, no bruits CV:  RRR, no rub, no gallop, no S3 Lung:  Fine bibasilar crackles, no wheeze Abdomen: soft/NT, +BS, nondistended, no peritoneal signs Ext: No cyanosis, No rashes, No petechiae, No lymphangitis, 2 + LE edema Neuro: CNII-XII intact, strength 4/5 in bilateral upper and lower extremities, no dysmetria  Labs on Admission:  Basic Metabolic Panel: Recent Labs  Lab 06/22/19 1052  NA 137  K 3.0*  CL 101  CO2 25  GLUCOSE 224*  BUN 61*  CREATININE 4.87*  CALCIUM  7.1*   Liver Function Tests: Recent Labs  Lab 06/22/19 1052  Key 21  ALT 21  ALKPHOS 107  BILITOT 0.3  PROT 6.9  ALBUMIN 3.1*   No results for input(s): LIPASE, AMYLASE in the last 168 hours. No results for input(s): AMMONIA in the last 168 hours. CBC: Recent Labs  Lab 06/22/19 1052  WBC 7.2  NEUTROABS 2.4  HGB 11.0*  HCT 35.0*  MCV 76.6*  PLT 290   Coagulation Profile: No results for input(s): INR, PROTIME in the last 168 hours. Cardiac Enzymes: No results for input(s): CKTOTAL, CKMB, CKMBINDEX, TROPONINI in the last 168 hours. BNP: Invalid input(s): POCBNP CBG: No results for input(s): GLUCAP in the last 168 hours. Urine analysis:    Component Value Date/Time   COLORURINE YELLOW 06/11/2012 0518   APPEARANCEUR CLEAR 06/11/2012 0518   LABSPEC >1.030 (H) 06/11/2012 0518   PHURINE 5.5 06/11/2012 0518   GLUCOSEU NEGATIVE 06/11/2012 0518   HGBUR NEGATIVE 06/11/2012 0518   BILIRUBINUR NEGATIVE 06/11/2012 0518   KETONESUR NEGATIVE 06/11/2012 0518   PROTEINUR 100 (A) 06/11/2012 0518   UROBILINOGEN 0.2 06/11/2012 0518   NITRITE NEGATIVE 06/11/2012 0518   LEUKOCYTESUR NEGATIVE 06/11/2012 0518   Sepsis Labs: @LABRCNTIP (procalcitonin:4,lacticidven:4) )No results found for this or any previous visit (from the past 240 hour(s)).   Radiological Exams on Admission: Dg Chest Port 1 View  Result Date: 06/22/2019 CLINICAL DATA:  Pt c/o swelling in lower extremities x several days. Hx CHF, obesity, diabetes, nonsmoker EXAM: PORTABLE CHEST 1 VIEW COMPARISON:  Chest radiographs dated 04/28/2019, 02/23/2019 FINDINGS: Stable cardiomediastinal contours with enlarged heart size. Central vascular congestion without overt edema. No pneumothorax or pleural effusion. Visualized skeleton is unremarkable. IMPRESSION: Cardiomegaly and central vascular congestion without overt edema. Electronically Signed   By: Audie Pinto M.D.   On: 06/22/2019 10:53    EKG: Independently reviewed.  Sinus, no STT changes    Time spent:70 minutes Code Status:   FULL Family Communication:  No Family at bedside Disposition Plan: expect 2-3 day hospitalization Consults called: renal DVT Prophylaxis: [place] Lovenox  Orson Eva, DO  [facility] Pager [phone]  If 7PM-7AM, please contact night-coverage [email or web address] Password [email or web address] 06/22/2019, 1:09 PM

## 2019-06-23 ENCOUNTER — Other Ambulatory Visit: Payer: Self-pay | Admitting: Vascular Surgery

## 2019-06-23 ENCOUNTER — Inpatient Hospital Stay (HOSPITAL_COMMUNITY): Payer: Medicare Other

## 2019-06-23 DIAGNOSIS — N189 Chronic kidney disease, unspecified: Secondary | ICD-10-CM

## 2019-06-23 DIAGNOSIS — I5031 Acute diastolic (congestive) heart failure: Secondary | ICD-10-CM

## 2019-06-23 LAB — CBC
HCT: 37.3 % — ABNORMAL LOW (ref 39.0–52.0)
Hemoglobin: 11.6 g/dL — ABNORMAL LOW (ref 13.0–17.0)
MCH: 24.3 pg — ABNORMAL LOW (ref 26.0–34.0)
MCHC: 31.1 g/dL (ref 30.0–36.0)
MCV: 78.2 fL — ABNORMAL LOW (ref 80.0–100.0)
Platelets: 295 10*3/uL (ref 150–400)
RBC: 4.77 MIL/uL (ref 4.22–5.81)
RDW: 15.1 % (ref 11.5–15.5)
WBC: 6.1 10*3/uL (ref 4.0–10.5)
nRBC: 0 % (ref 0.0–0.2)

## 2019-06-23 LAB — ECHOCARDIOGRAM COMPLETE
Height: 67 in
Weight: 4960 oz

## 2019-06-23 LAB — URIC ACID: Uric Acid, Serum: 9.7 mg/dL — ABNORMAL HIGH (ref 3.7–8.6)

## 2019-06-23 LAB — URINALYSIS, COMPLETE (UACMP) WITH MICROSCOPIC
Bacteria, UA: NONE SEEN
Bilirubin Urine: NEGATIVE
Glucose, UA: >=500 mg/dL — AB
Ketones, ur: NEGATIVE mg/dL
Leukocytes,Ua: NEGATIVE
Nitrite: NEGATIVE
Protein, ur: >=300 mg/dL — AB
Specific Gravity, Urine: 1.01 (ref 1.005–1.030)
pH: 6 (ref 5.0–8.0)

## 2019-06-23 LAB — RENAL FUNCTION PANEL
Albumin: 3.1 g/dL — ABNORMAL LOW (ref 3.5–5.0)
Anion gap: 13 (ref 5–15)
BUN: 55 mg/dL — ABNORMAL HIGH (ref 6–20)
CO2: 24 mmol/L (ref 22–32)
Calcium: 7.6 mg/dL — ABNORMAL LOW (ref 8.9–10.3)
Chloride: 103 mmol/L (ref 98–111)
Creatinine, Ser: 4.93 mg/dL — ABNORMAL HIGH (ref 0.61–1.24)
GFR calc Af Amer: 15 mL/min — ABNORMAL LOW (ref >60)
GFR calc non Af Amer: 13 mL/min — ABNORMAL LOW (ref >60)
Glucose, Bld: 273 mg/dL — ABNORMAL HIGH (ref 70–99)
Phosphorus: 5.6 mg/dL — ABNORMAL HIGH (ref 2.5–4.6)
Potassium: 3.4 mmol/L — ABNORMAL LOW (ref 3.5–5.1)
Sodium: 140 mmol/L (ref 135–145)

## 2019-06-23 LAB — HEMOGLOBIN A1C
Hgb A1c MFr Bld: 12.1 % — ABNORMAL HIGH (ref 4.8–5.6)
Mean Plasma Glucose: 300.57 mg/dL

## 2019-06-23 LAB — GLUCOSE, CAPILLARY
Glucose-Capillary: 243 mg/dL — ABNORMAL HIGH (ref 70–99)
Glucose-Capillary: 303 mg/dL — ABNORMAL HIGH (ref 70–99)
Glucose-Capillary: 335 mg/dL — ABNORMAL HIGH (ref 70–99)
Glucose-Capillary: 336 mg/dL — ABNORMAL HIGH (ref 70–99)
Glucose-Capillary: 403 mg/dL — ABNORMAL HIGH (ref 70–99)
Glucose-Capillary: 425 mg/dL — ABNORMAL HIGH (ref 70–99)

## 2019-06-23 LAB — GLUCOSE, RANDOM: Glucose, Bld: 354 mg/dL — ABNORMAL HIGH (ref 70–99)

## 2019-06-23 LAB — HIV ANTIBODY (ROUTINE TESTING W REFLEX): HIV Screen 4th Generation wRfx: NONREACTIVE

## 2019-06-23 MED ORDER — INSULIN ASPART 100 UNIT/ML ~~LOC~~ SOLN
20.0000 [IU] | Freq: Once | SUBCUTANEOUS | Status: AC
  Administered 2019-06-23: 14:00:00 20 [IU] via SUBCUTANEOUS

## 2019-06-23 MED ORDER — FUROSEMIDE 10 MG/ML IJ SOLN
80.0000 mg | Freq: Three times a day (TID) | INTRAMUSCULAR | Status: DC
  Administered 2019-06-23 – 2019-06-26 (×11): 80 mg via INTRAVENOUS
  Filled 2019-06-23 (×12): qty 8

## 2019-06-23 MED ORDER — LABETALOL HCL 200 MG PO TABS
300.0000 mg | ORAL_TABLET | Freq: Three times a day (TID) | ORAL | Status: AC
  Administered 2019-06-23 – 2019-06-24 (×5): 300 mg via ORAL
  Filled 2019-06-23 (×5): qty 2

## 2019-06-23 NOTE — Plan of Care (Signed)

## 2019-06-23 NOTE — Progress Notes (Signed)
PROGRESS NOTE  Kelly Key [id number] DOB: 09/26/1971 DOA: 06/22/2019 PCP: [name], MD  Brief History:  48 y.o. male with medical history of hypertension, CKD stage IV, diastolic CHF, dyslipidemia, and gouty arthritis presenting with 2 to 3-week history of increasing lower extremity edema and increasing abdominal girth.  He states that his symptoms have worsened in the past 2 to 3 days.  He denies any fevers, chills, chest pain, shortness breath, cough, hemoptysis, nausea, vomiting, diarrhea, abdominal pain, dysuria, hematuria.  He endorses compliance with his torsemide which he takes 60 mg in the a.m. and 40 mg in the p.m.  He states that he has been on this dose for the last 2 months.  He denies drinking any excess fluid or other dietary indiscretions.  Because of his increasing leg heaviness, the patient states that he has been having difficulty getting around with leg weakness.  He denies any back pain or recent falls.  He denies any NSAID use.  The patient recently had a renal artery ultrasound duplex which was negative for renal artery stenosis.  He was supposed to be scheduled for an MRI of his renal arteries in the future by Dr. Juanda Crumble fields. In the emergency department, the patient was afebrile hemodynamically stable saturating 100% on room air.  BMP showed a potassium of 3.0 and serum creatinine of 4.87.  LFTs were unremarkable.  WBC was 7.2.  Chest x-ray showed cardiomegaly with some vascular congestion.  He was admitted for management of his fluid overload and anasarca.  Assessment/Plan: Anasarca/Fluid overload/acute on chronic renal failure--CKD stage IV -Suspect the patient likely has continued progression of his underlying kidney disease -Patient likely has underlying nephrotic syndrome and FSGS -Urine protein creatinine ratio--6.2 grams in June 2019 -The patient had been adamant about not pursuing dialysis, but may be having a change of heart at this  point -Nephrology consult appreciated -Previous baseline creatinine 3.6-3.9 in April 2020 -Presented with serum creatinine 4.7 -renal US--neg for hydronephrosis -Lasix IV 80 mg twice daily-->increase to tid -Echo -daily weights  Uncontrolled diabetes mellitus type 2 with hyperglycemia -Hemoglobin A1c--12.1 -NovoLog sliding scale -Continue Levemir  Essential hypertension, uncontrolled -Continue amlodipine, clonidine, labetalol -increased labetalol to 300 mg tid  Hyperlipidemia -Continue statin  Leg edema/pain -venous duplex--neg  Hypokalemia -replete  Hyperuricemia -continue allopurinol       Disposition Plan:   Home in 2-3 days  Family Communication:  No Family at bedside  Consultants:  renal  Code Status:  FULL   DVT Prophylaxis:  [place] Heparin    Procedures: As Listed in Progress Note Above  Antibiotics: None       Subjective: Pt still complains of bilateral ankle pain, a little better than yesterday.  He denies f/c, cp, sob, n/v/d, abd pain, headache.  Objective: Vitals:   06/22/19 1430 06/22/19 1445 06/22/19 2149 06/23/19 0509  BP: (!) 154/101 (!) 157/89 (!) 187/119 (!) 178/101  Pulse: 79 82 91 88  Resp: 19 12 20 20   Temp:   98.3 F (36.8 C) 98.2 F (36.8 C)  TempSrc:   Oral Oral  SpO2: 97% 99% 100% 100%  Weight:      Height:        Intake/Output Summary (Last 24 hours) at 06/23/2019 1549 Last data filed at 06/23/2019 0500 Gross per 24 hour  Intake --  Output 600 ml  Net -600 ml   Weight change:  Exam:   General:  Pt is  alert, follows commands appropriately, not in acute distress  HEENT: No icterus, No thrush, No neck mass, [place]/AT  Cardiovascular: RRR, S1/S2, no rubs, no gallops  Respiratory: fine bibasilar crackles, no wheeze  Abdomen: Soft/+BS, non tender, non distended, no guarding  Extremities: 3 + LE edema, No lymphangitis, No petechiae, No rashes, no synovitis   Data Reviewed: I have personally reviewed  following labs and imaging studies Basic Metabolic Panel: Recent Labs  Lab 06/22/19 1052 06/23/19 0625  NA 137 140  K 3.0* 3.4*  CL 101 103  CO2 25 24  GLUCOSE 224* 273*  BUN 61* 55*  CREATININE 4.87* 4.93*  CALCIUM 7.1* 7.6*  PHOS  --  5.6*   Liver Function Tests: Recent Labs  Lab 06/22/19 1052 06/23/19 0625  AST 21  --   ALT 21  --   ALKPHOS 107  --   BILITOT 0.3  --   PROT 6.9  --   ALBUMIN 3.1* 3.1*   No results for input(s): LIPASE, AMYLASE in the last 168 hours. No results for input(s): AMMONIA in the last 168 hours. Coagulation Profile: No results for input(s): INR, PROTIME in the last 168 hours. CBC: Recent Labs  Lab 06/22/19 1052 06/23/19 0625  WBC 7.2 6.1  NEUTROABS 2.4  --   HGB 11.0* 11.6*  HCT 35.0* 37.3*  MCV 76.6* 78.2*  PLT 290 295   Cardiac Enzymes: No results for input(s): CKTOTAL, CKMB, CKMBINDEX, TROPONINI in the last 168 hours. BNP: Invalid input(s): POCBNP CBG: Recent Labs  Lab 06/22/19 2150 06/23/19 0756 06/23/19 1155  GLUCAP 361* 243* 425*   HbA1C: Recent Labs    06/22/19 1053 06/23/19 0625  HGBA1C 12.4* 12.1*   Urine analysis:    Component Value Date/Time   COLORURINE STRAW (A) 06/23/2019 0500   APPEARANCEUR CLEAR 06/23/2019 0500   LABSPEC 1.010 06/23/2019 0500   PHURINE 6.0 06/23/2019 0500   GLUCOSEU >=500 (A) 06/23/2019 0500   HGBUR SMALL (A) 06/23/2019 0500   BILIRUBINUR NEGATIVE 06/23/2019 0500   KETONESUR NEGATIVE 06/23/2019 0500   PROTEINUR >=300 (A) 06/23/2019 0500   UROBILINOGEN 0.2 06/11/2012 0518   NITRITE NEGATIVE 06/23/2019 0500   LEUKOCYTESUR NEGATIVE 06/23/2019 0500   Sepsis Labs: @LABRCNTIP (procalcitonin:4,lacticidven:4) ) Recent Results (from the past 240 hour(s))  SARS CORONAVIRUS 2 (Celsey Asselin 6-12 HRS) Nasal Swab Aptima Multi Swab     Status: None   Collection Time: 06/22/19 12:30 PM   Specimen: Aptima Multi Swab; Nasal Swab  Result Value Ref Range Status   SARS Coronavirus 2 NEGATIVE NEGATIVE  Final    Comment: (NOTE) SARS-CoV-2 target nucleic acids are NOT DETECTED. The SARS-CoV-2 RNA is generally detectable in upper and lower respiratory specimens during the acute phase of infection. Negative results do not preclude SARS-CoV-2 infection, do not rule out co-infections with other pathogens, and should not be used as the sole basis for treatment or other patient management decisions. Negative results must be combined with clinical observations, patient history, and epidemiological information. The expected result is Negative. Fact Sheet for Patients: SugarRoll.be Fact Sheet for Healthcare Providers: https://www.woods-mathews.com/ This test is not yet approved or cleared by the [place] FDA and  has been authorized for detection and/or diagnosis of SARS-CoV-2 by FDA under an Emergency Use Authorization (EUA). This EUA will remain  in effect (meaning this test can be used) for the duration of the COVID-19 declaration under Section 56 4(b)(1) of the Act, 21 U.S.C. section 360bbb-3(b)(1), unless the authorization is terminated or revoked sooner. Performed at  [facility], [place] [address] [place], [place] [address]      Scheduled Meds:  amLODipine  10 mg Oral Daily   atorvastatin  80 mg Oral Daily   cloNIDine  0.3 mg Oral TID   ezetimibe  10 mg Oral QPM   furosemide  80 mg Intravenous TID   gabapentin  100 mg Oral TID   heparin  5,000 Units Subcutaneous Q8H   insulin aspart  0-15 Units Subcutaneous TID WC   insulin aspart  0-5 Units Subcutaneous QHS   insulin detemir  20 Units Subcutaneous BID   labetalol  300 mg Oral TID   linaclotide  145 mcg Oral QAC breakfast   potassium chloride SA  20 mEq Oral Daily   sodium bicarbonate  650 mg Oral BID   Continuous Infusions:  Procedures/Studies: US Renal  Result Date: 06/23/2019 CLINICAL DATA:  Acute renal failure EXAM: RENAL / URINARY TRACT ULTRASOUND  COMPLETE COMPARISON:  None. FINDINGS: Right Kidney: Renal measurements: 11.7 x 4.8 x 6.1 cm = volume: 177 mL . Echogenicity within normal limits. No mass or hydronephrosis visualized. Left Kidney: Renal measurements: 11.1 x 5.3 x 5.9 cm = volume: 181 mL. Echogenicity within normal limits. No mass or hydronephrosis visualized. Bladder: Appears normal for degree of bladder distention. IMPRESSION: Normal bilateral renal ultrasound. Electronically Signed   By: Kathreen Devoid   On: 06/23/2019 11:46   US Venous Img Lower Bilateral  Result Date: 06/22/2019 CLINICAL DATA:  Bilateral lower extremity pain and edema. Evaluate for DVT. EXAM: BILATERAL LOWER EXTREMITY VENOUS DOPPLER ULTRASOUND TECHNIQUE: Gray-scale sonography with graded compression, as well as color Doppler and duplex ultrasound were performed to evaluate the lower extremity deep venous systems from the level of the common femoral vein and including the common femoral, femoral, profunda femoral, popliteal and calf veins including the posterior tibial, peroneal and gastrocnemius veins when visible. The superficial great saphenous vein was also interrogated. Spectral Doppler was utilized to evaluate flow at rest and with distal augmentation maneuvers in the common femoral, femoral and popliteal veins. COMPARISON:  None. FINDINGS: RIGHT LOWER EXTREMITY Common Femoral Vein: No evidence of thrombus. Normal compressibility, respiratory phasicity and response to augmentation. Saphenofemoral Junction: No evidence of thrombus. Normal compressibility and flow on color Doppler imaging. Profunda Femoral Vein: No evidence of thrombus. Normal compressibility and flow on color Doppler imaging. Femoral Vein: No evidence of thrombus. Normal compressibility, respiratory phasicity and response to augmentation. Popliteal Vein: No evidence of thrombus. Normal compressibility, respiratory phasicity and response to augmentation. Calf Veins: No evidence of thrombus. Normal  compressibility and flow on color Doppler imaging. Superficial Great Saphenous Vein: No evidence of thrombus. Normal compressibility. Venous Reflux:  None. Other Findings:  None. LEFT LOWER EXTREMITY Common Femoral Vein: No evidence of thrombus. Normal compressibility, respiratory phasicity and response to augmentation. Saphenofemoral Junction: No evidence of thrombus. Normal compressibility and flow on color Doppler imaging. Profunda Femoral Vein: No evidence of thrombus. Normal compressibility and flow on color Doppler imaging. Femoral Vein: No evidence of thrombus. Normal compressibility, respiratory phasicity and response to augmentation. Popliteal Vein: No evidence of thrombus. Normal compressibility, respiratory phasicity and response to augmentation. Calf Veins: No evidence of thrombus. Normal compressibility and flow on color Doppler imaging. Superficial Great Saphenous Vein: No evidence of thrombus. Normal compressibility. Venous Reflux:  None. Other Findings:  None. IMPRESSION: No evidence of DVT within either lower extremity. Electronically Signed   By: Sandi Mariscal M.D.   On: 06/22/2019 14:52   Dg Chest  Port 1 View  Result Date: 06/22/2019 CLINICAL DATA:  Pt c/o swelling in lower extremities x several days. Hx CHF, obesity, diabetes, nonsmoker EXAM: PORTABLE CHEST 1 VIEW COMPARISON:  Chest radiographs dated 04/28/2019, 02/23/2019 FINDINGS: Stable cardiomediastinal contours with enlarged heart size. Central vascular congestion without overt edema. No pneumothorax or pleural effusion. Visualized skeleton is unremarkable. IMPRESSION: Cardiomegaly and central vascular congestion without overt edema. Electronically Signed   By: Audie Pinto M.D.   On: 06/22/2019 10:53   Vas US Renal Artery Duplex  Result Date: 06/16/2019 ABDOMINAL VISCERAL High Risk Factors: Hypertension, hyperlipidemia, Diabetes, no history of                    smoking. Other Factors: Limited study secondary to obesity and bowel  gas. Limitations: Air/bowel gas and obesity. Performing Technologist: Delorise Shiner RVT  Examination Guidelines: A complete evaluation includes B-mode imaging, spectral Doppler, color Doppler, and power Doppler as needed of all accessible portions of each vessel. Bilateral testing is considered an integral part of a complete examination. Limited examinations for reoccurring indications may be performed as noted.  Duplex Findings: +----------+--------+--------+------+--------+  Mesenteric PSV cm/s EDV cm/s Plaque Comments  +----------+--------+--------+------+--------+  Aorta Mid     58                              +----------+--------+--------+------+--------+  +------------------+--------+--------+-------+  Right Renal Artery PSV cm/s EDV cm/s Comment  +------------------+--------+--------+-------+  Proximal              25                      +------------------+--------+--------+-------+  Mid                   26                      +------------------+--------+--------+-------+  Distal                27                      +------------------+--------+--------+-------+ +-----------------+--------+--------+-------+  Left Renal Artery PSV cm/s EDV cm/s Comment  +-----------------+--------+--------+-------+  Origin               40                      +-----------------+--------+--------+-------+  Proximal             29                      +-----------------+--------+--------+-------+  Mid                  27                      +-----------------+--------+--------+-------+  Distal               23                      +-----------------+--------+--------+-------+ +------------+--------+--------+--+-----------+--------+--------+---+  Right Kidney PSV cm/s EDV cm/s RI Left Kidney PSV cm/s EDV cm/s RI   +------------+--------+--------+--+-----------+--------+--------+---+  Upper Pole                        Upper Pole                         +------------+--------+--------+--+-----------+--------+--------+---+  Mid                               Mid                                +------------+--------+--------+--+-----------+--------+--------+---+  Lower Pole                        Lower Pole                         +------------+--------+--------+--+-----------+--------+--------+---+  Hilar                             Hilar                              +------------+--------+--------+--+-----------+--------+--------+---+ +------------------+-------+------------------+-------+  Right Kidney               Left Kidney                 +------------------+-------+------------------+-------+  RAR                        RAR                         +------------------+-------+------------------+-------+  RAR (manual)               RAR (manual)                +------------------+-------+------------------+-------+  Cortex                     Cortex                      +------------------+-------+------------------+-------+  Cortex thickness   1.88 mm Corex thickness    1.84 mm  +------------------+-------+------------------+-------+  Kidney length (cm) 13.27   Kidney length (cm) 10.24    +------------------+-------+------------------+-------+  Summary: Renal:  Right: Normal size right kidney. No evidence of right renal artery        stenosis. RRV flow present. Limited study secondary to body        habitus. Could not insonnate right renal artery origin. Left:  Normal size of left kidney. No evidence of left renal artery        stenosis. LRV flow present. Limited study secondary to body        habitus.  *See table(s) above for measurements and observations.  Diagnosing physician: Ruta Hinds MD  Electronically signed by Ruta Hinds MD on 06/16/2019 at 10:43:24 AM.    Final     Orson Eva, DO  [facility] Pager [phone]  If 7PM-7AM, please contact night-coverage [email or web address] Password [email or web address] 06/23/2019, 3:49 PM   LOS: 1 day

## 2019-06-23 NOTE — TOC Initial Note (Addendum)
Transition of Care [facility]) - Initial/Assessment Note    Patient Details  Name: Kelly Key MRN: [id number] Date of Birth: 01/11/1971  [facility] of [facility]) CM/SW Contact:    Deitra Craine, Chauncey Reading, RN Phone Number: 06/23/2019, 2:46 PM  Clinical Narrative: Acute on chronic renal failure--CKD stage IV. High risk readmission due to four ED visits in six months and 32 active RX orders. From home with girlfriend, independent. Has crutches and RW at home if needed. Still drives, has PCP and cardiologist.   Nephrology consulted this visit. Patient reports he has watched educational vidoes today regarding HD. At this time, reporting that he is not interseted in beginning dialysis.   He reports he does take his medications as prescribed, no issues obtaining medications. Has CBG meters, checks TID. Weighs daily. Does report some diet indiscretions at times, but mostly follows diet recommendations.   He has been active with [facility] in the past. He is agreeable to referral again for hospital follow up.   TOC to follow for ongoing needs and will schedule follow up appointments as needed.              Expected Discharge Plan: Home/Self Care Barriers to Discharge: Continued Medical Work up   Patient Goals and CMS Choice Patient states their goals for this hospitalization and ongoing recovery are:: rerturn home, not interested in dialysis      Expected Discharge Plan and Services Expected Discharge Plan: Home/Self Care   Discharge Planning Services: CM Consult, Follow-up appt scheduled   Living arrangements for the past 2 months: Single Family Home                     Prior Living Arrangements/Services Living arrangements for the past 2 months: Single Family Home Lives with:: Significant Other Patient language and need for interpreter reviewed:: Yes Do you feel safe going back to the place where you live?: Yes      Need for Family Participation in Patient Care: No (Comment) Care giver  support system in place?: Yes (comment) Current home services: DME(crutches, RW) Criminal Activity/Legal Involvement Pertinent to Current Situation/Hospitalization: No - Comment as needed  Activities of Daily Living Home Assistive Devices/Equipment: None ADL Screening (condition at time of admission) Patient's cognitive ability adequate to safely complete daily activities?: Yes Is the patient deaf or have difficulty hearing?: No Does the patient have difficulty seeing, even when wearing glasses/contacts?: No Does the patient have difficulty concentrating, remembering, or making decisions?: No Patient able to express need for assistance with ADLs?: Yes Does the patient have difficulty dressing or bathing?: No Independently performs ADLs?: Yes (appropriate for developmental age) Does the patient have difficulty walking or climbing stairs?: Yes Weakness of Legs: Both Weakness of Arms/Hands: None  Permission Sought/Granted Permission sought to share information with : Other (comment)       Permission granted to share info w AGENCY: [facility]        Emotional Assessment   Attitude/Demeanor/Rapport: Engaged Affect (typically observed): Accepting Orientation: : Oriented to Self, Oriented to Place, Oriented to  Time Alcohol / Substance Use: Not Applicable Psych Involvement: No (comment)  Admission diagnosis:  Hypokalemia [E87.6] Leg pain [M79.606] Prolonged Q-T interval on ECG [R94.31] CKD (chronic kidney disease), stage IV (HCC) [N18.4] Bilateral leg edema [R60.0] Essential hypertension [I10] Acute on chronic congestive heart failure, unspecified heart failure type [facility]) [I50.9] Patient Active Problem List   Diagnosis Date Noted  . Acute renal failure superimposed on stage 4 chronic kidney disease (  [place]) 06/22/2019  . Uncontrolled type 2 diabetes mellitus with hyperglycemia, with long-term current use of insulin ([place]) 06/22/2019  . Hypokalemia   . Acute on chronic diastolic congestive  heart failure ([place]) 05/05/2019  . Thromboembolism ([place]) 02/20/2019  . AKI (acute kidney injury) ([place]) 02/20/2019  . CHF exacerbation ([place]) 03/26/2018  . Acute on chronic diastolic CHF (congestive heart failure) ([place]) 03/26/2018  . Acute respiratory failure with hypoxia ([place]) 03/26/2018  . Diabetes mellitus ([place]) 06/12/2012  . Hyponatremia 06/12/2012  . Microcytic anemia 06/12/2012  . Acute diastolic CHF (congestive heart failure) ([place]) 06/11/2012  . HTN (hypertension) 06/11/2012  . Elevated LFTs 06/11/2012  . Morbid obesity ([place]) 06/11/2012  . Hepatic steatosis 06/11/2012   PCP:  [name], MD Pharmacy:   [facility] [address] [place] [address] Phone: [phone] Fax: [phone]     Social Determinants of Health (SDOH) Interventions    Readmission Risk Interventions Readmission Risk Prevention Plan 06/23/2019 02/21/2019  Transportation Screening Complete Complete  PCP or Specialist Appt within 3-5 Days - Complete  HRI or Home Care Consult Complete Not Complete  HRI or Home Care Consult comments - not needed, independent, active with [place] for [place] Planning/Counseling Complete Complete  Palliative Care Screening Not Applicable Not Applicable  Medication Review Press photographer) Complete Complete  Some recent data might be hidden

## 2019-06-23 NOTE — Progress Notes (Signed)
*  PRELIMINARY RESULTS* Echocardiogram 2D Echocardiogram has been performed.  Leavy Cella 06/23/2019, 4:53 PM

## 2019-06-23 NOTE — Progress Notes (Signed)
Notified Dr. Carles Collet that patient's CBG was 425.

## 2019-06-23 NOTE — Progress Notes (Signed)
Inpatient Diabetes Program Recommendations  AACE/ADA: New Consensus Statement on Inpatient Glycemic Control (2015)  Target Ranges:  Prepandial:   less than 140 mg/dL      Peak postprandial:   less than 180 mg/dL (1-2 hours)      Critically ill patients:  140 - 180 mg/dL   Lab Results  Component Value Date   GLUCAP 425 (H) 06/23/2019   HGBA1C 12.1 (H) 06/23/2019   Results for Kelly Key, Kelly Key (MRN [id number]) as of 06/23/2019 12:21  Ref. Range 06/22/2019 21:50 06/23/2019 07:56 06/23/2019 11:55  Glucose-Capillary Latest Ref Range: 70 - 99 mg/dL 361 (H) 243 (H) 425 (H)   Review of Glycemic Control  Diabetes history: type 2? Outpatient Diabetes medications: Levemir 15 units every am, 45 units every pm, Novolog 5-15 units TID Current orders for Inpatient glycemic control: Levemir 20 U BID, Novolog correction scale MODERATE TID & HS scale   Inpatient Diabetes Program Recommendations:    Noted that blood sugars have been greater than 250 mg/dl. Recommend increasing Levemir to 25 units BID, continuing Novolog MODERATE correction scale TID & HS scale, and add Novolog 5 units TID with meals if eats at least 50 % of meal. Titrate Levemir dosage as needed.  Harvel Ricks RN BSN CDE Diabetes Coordinator Pager: [phone]  8am-5pm

## 2019-06-23 NOTE — Consult Note (Signed)
Patient seen in our office on June 16, 2019.  He was initially referred for renal artery stenosis.  His wife called [place] when he was being admitted to any pain hospital.  We had scheduled him for an MRA of the renal arteries due to the fact that we were unable to visualize his renal arteries completely during recent duplex exam.  We have ordered a renal MRA to be performed while he is admitted to any pain hospital.  I will follow-up on the results of that study.  Ruta Hinds, MD [facility] Office: [phone] Pager: [phone]

## 2019-06-23 NOTE — Consult Note (Signed)
Reason for Consult: AKI/CKD and anasarca Referring Physician: Tat, MD  Kelly Key is an 48 y.o. male.  HPI: Kelly Key has a PMH significant for diastolic CHF, DM, HTN, HLD, morbid obesity, fatty liver disease, and CKD stage 4 followed by Dr. Lowanda Foster with nephrotic range proteinuria (documented in past as >11 grams/24 hours) who presented to [facility] ED with a 2 week history of worsening lower extremity edema and abdominal girth not responsive to increasing doses of torsemide as an outpatient.  He has had accelerated HTN and workup for renal artery stenosis was underway with [name] however that has not been scheduled.    In the ED he was noted to be hemodynamically stable, pulse ox 500% on room air, K 3, BUN/Cr 61/4.87, CXR with cardiomegaly and vascular congestion.  We were consulted to further evaluate and manage his AKI/CKD and his failure of outpatient diuretics for his diastolic CHF/anasarca.  The trend in Scr is seen below.  Of note, Kelly Key has expressed that he would "rather die than go on dialysis" and is followed by Dr. Lowanda Foster, however it does not appear that he has been seen since 01/25/19.  He has not had a renal biopsy and the working DDx was combination of diabetic nephropathy and obesity related FSGS.  He admits that he does not really understand dialysis but has seen people do poorly with it and is afraid of it.  Trend in Creatinine: Creatinine, Ser  Date/Time Value Ref Range Status  06/23/2019 06:25 AM 4.93 (H) 0.61 - 1.24 mg/dL Final  06/22/2019 10:52 AM 4.87 (H) 0.61 - 1.24 mg/dL Final  05/05/2019 01:14 AM 5.28 (H) 0.61 - 1.24 mg/dL Final  04/28/2019 09:33 AM 4.93 (H) 0.61 - 1.24 mg/dL Final  02/23/2019 04:20 AM 3.52 (H) 0.61 - 1.24 mg/dL Final  02/22/2019 05:06 AM 3.65 (H) 0.61 - 1.24 mg/dL Final  02/21/2019 04:53 AM 3.59 (H) 0.61 - 1.24 mg/dL Final  02/20/2019 09:00 AM 3.98 (H) 0.61 - 1.24 mg/dL Final  04/04/2018 06:28 AM 2.30 (H) 0.61 - 1.24 mg/dL Final  04/03/2018 07:06  AM 2.21 (H) 0.61 - 1.24 mg/dL Final  04/02/2018 10:00 AM 2.32 (H) 0.61 - 1.24 mg/dL Final  04/01/2018 10:38 AM 2.44 (H) 0.61 - 1.24 mg/dL Final  03/31/2018 04:51 PM 2.65 (H) 0.61 - 1.24 mg/dL Final  03/30/2018 06:36 AM 2.74 (H) 0.61 - 1.24 mg/dL Final  03/29/2018 09:36 AM 2.66 (H) 0.61 - 1.24 mg/dL Final  03/28/2018 12:52 PM 2.46 (H) 0.61 - 1.24 mg/dL Final  03/27/2018 07:07 AM 2.28 (H) 0.61 - 1.24 mg/dL Final  03/26/2018 06:47 AM 2.24 (H) 0.61 - 1.24 mg/dL Final  06/12/2012 06:57 AM 1.18 0.50 - 1.35 mg/dL Final  06/11/2012 04:56 AM 1.31 0.50 - 1.35 mg/dL Final    PMH:   Past Medical History:  Diagnosis Date  . Acute diastolic CHF (congestive heart failure) ([place]) 06/11/2012   EF 50-55% [facility])  . Diabetes mellitus    A1c 11.5 (06/11/2012).  . Gout   . Hepatic steatosis 06/11/2012   Elevated LFTs  . Hyperlipemia   . Malignant hypertension   . Microcytic anemia 06/12/2012  . Obesity     PSH:   Past Surgical History:  Procedure Laterality Date  . NO PAST SURGERIES      Allergies:  Allergies  Allergen Reactions  . Hydralazine   . Lisinopril Swelling    Medications:   Prior to Admission medications   Medication Sig Start Date End Date Taking?  Authorizing Provider  albuterol (VENTOLIN HFA) 108 (90 Base) MCG/ACT inhaler Inhale 2 puffs into the lungs every 4 (four) hours as needed. 03/24/19  Yes [provider]  amLODipine (NORVASC) 10 MG tablet Take 10 mg by mouth daily.   Yes [provider]  atorvastatin (LIPITOR) 80 MG tablet Take 1 tablet by mouth daily. 06/06/19  Yes [provider]  cloNIDine (CATAPRES) 0.3 MG tablet Take 1 tablet (0.3 mg total) by mouth 3 (three) times daily. 04/04/18 06/22/19 Yes Shah, Pratik D, DO  COLCRYS 0.6 MG tablet Take 0.6 mg by mouth 2 (two) times daily. 03/22/19  Yes [provider]  ergocalciferol (VITAMIN D2) 1.25 MG (50000 UT) capsule Take 50,000 Units by mouth once a week.   Yes [provider]  ezetimibe (ZETIA) 10 MG tablet Take 10 mg by mouth every evening. 01/20/19  Yes [provider]  fluticasone (FLONASE) 50 MCG/ACT nasal spray Place 1 spray into both nostrils daily. 04/04/18 06/22/19 Yes Shah, Pratik D, DO  gabapentin (NEURONTIN) 100 MG capsule Take 1 capsule by mouth 3 (three) times daily. 05/12/19  Yes [provider]  insulin aspart (NOVOLOG) 100 UNIT/ML injection Inject 5-15 Units into the skin 3 (three) times daily before meals.    Yes [provider]  insulin detemir (LEVEMIR) 100 UNIT/ML injection Inject 0.15-0.45 mLs (15-45 Units total) into the skin 2 (two) times daily. Patient takes 15 units in the morning and 45 units in the evening 02/23/19  Yes Johnson, Clanford L, MD  Insulin Pen Needle 31G X 5 MM MISC Use as directed. 04/04/18  Yes Shah, Pratik D, DO  labetalol (NORMODYNE) 200 MG tablet Take 200 mg by mouth 2 (two) times daily.   Yes [provider]  linaclotide Rolan Lipa) 145 MCG CAPS capsule Take 1 capsule (145 mcg total) by mouth daily before breakfast. 04/05/18  Yes Manuella Ghazi, Pratik D, DO  potassium chloride (MICRO-K) 10 MEQ CR capsule Take 10 mEq by mouth daily. 04/21/19  Yes [provider]  potassium chloride SA (K-DUR) 20 MEQ tablet Take 1 tablet (20 mEq total) by mouth daily. 02/28/19  Yes Johnson, Clanford L, MD  sodium bicarbonate 650 MG tablet Take 650 mg by mouth 2 (two) times daily.   Yes [provider]  torsemide (DEMADEX) 20 MG tablet TAKE 60 MG IN THE MORNING AND 40 MG IN THE EVENING Patient taking differently: Take 20 mg by mouth 3 (three) times daily.  04/18/19  Yes BranchAlphonse Guild, MD  HYDROcodone-acetaminophen (NORCO/VICODIN) 5-325 MG tablet Take 1 tablet by mouth every 6 (six) hours as needed. Patient not taking: Reported on 06/22/2019 04/28/19   Fredia Sorrow, MD    Inpatient medications: . amLODipine  10 mg Oral Daily  . atorvastatin  80 mg Oral Daily  . cloNIDine  0.3 mg Oral TID  .  ezetimibe  10 mg Oral QPM  . furosemide  80 mg Intravenous BID  . gabapentin  100 mg Oral TID  . heparin  5,000 Units Subcutaneous Q8H  . insulin aspart  0-15 Units Subcutaneous TID WC  . insulin aspart  0-5 Units Subcutaneous QHS  . insulin detemir  20 Units Subcutaneous BID  . labetalol  200 mg Oral BID  . linaclotide  145 mcg Oral QAC breakfast  . potassium chloride SA  20 mEq Oral Daily  . sodium bicarbonate  650 mg Oral BID    Discontinued Meds:   Medications Discontinued During This Encounter  Medication Reason  .  atorvastatin (LIPITOR) tablet 80 mg   . ondansetron (ZOFRAN) tablet 4 mg   . ondansetron (ZOFRAN) injection 4 mg   . acetaminophen (TYLENOL) tablet 650 mg   . acetaminophen (TYLENOL) suppository 650 mg     Social History:  reports that he has never smoked. He has never used smokeless tobacco. He reports that he does not drink alcohol or use drugs.  Family History:   Family History  Problem Relation Age of Onset  . Gout Mother   . Asthma Mother   . Diabetes Father   . Heart failure Father   . Diabetes Sister   . Hypertension Brother   . Pancreatic cancer Brother   . Diabetes Sister     Pertinent items are noted in HPI. Weight change:   Intake/Output Summary (Last 24 hours) at 06/23/2019 0954 Last data filed at 06/23/2019 0500 Gross per 24 hour  Intake -  Output 600 ml  Net -600 ml   BP (!) 178/101 (BP Location: Left Arm)   Pulse 88   Temp 98.2 F (36.8 C) (Oral)   Resp 20   Ht 5\' 7"  (1.702 m)   Wt (!) 140.6 kg   SpO2 100%   BMI 48.55 kg/m  Vitals:   06/22/19 1430 06/22/19 1445 06/22/19 2149 06/23/19 0509  BP: (!) 154/101 (!) 157/89 (!) 187/119 (!) 178/101  Pulse: 79 82 91 88  Resp: 19 12 20 20   Temp:   98.3 F (36.8 C) 98.2 F (36.8 C)  TempSrc:   Oral Oral  SpO2: 97% 99% 100% 100%  Weight:      Height:         General appearance: alert, cooperative, no distress and moderately obese Head: Normocephalic, without obvious abnormality,  atraumatic Eyes: negative findings: lids and lashes normal, conjunctivae and sclerae normal and corneas clear Resp: clear to auscultation bilaterally Cardio: regular rate and rhythm, S1, S2 normal, no murmur, click, rub or gallop GI: soft, non-tender; bowel sounds normal; no masses,  no organomegaly Extremities: edema 2+ edema  Labs: Basic Metabolic Panel: Recent Labs  Lab 06/22/19 1052 06/23/19 0625  NA 137 140  K 3.0* 3.4*  CL 101 103  CO2 25 24  GLUCOSE 224* 273*  BUN 61* 55*  CREATININE 4.87* 4.93*  ALBUMIN 3.1* 3.1*  CALCIUM 7.1* 7.6*  PHOS  --  5.6*   Liver Function Tests: Recent Labs  Lab 06/22/19 1052 06/23/19 0625  AST 21  --   ALT 21  --   ALKPHOS 107  --   BILITOT 0.3  --   PROT 6.9  --   ALBUMIN 3.1* 3.1*   No results for input(s): LIPASE, AMYLASE in the last 168 hours. No results for input(s): AMMONIA in the last 168 hours. CBC: Recent Labs  Lab 06/22/19 1052 06/23/19 0625  WBC 7.2 6.1  NEUTROABS 2.4  --   HGB 11.0* 11.6*  HCT 35.0* 37.3*  MCV 76.6* 78.2*  PLT 290 295   PT/INR: @LABRCNTIP (inr:5) Cardiac Enzymes: )No results for input(s): CKTOTAL, CKMB, CKMBINDEX, TROPONINI in the last 168 hours. CBG: Recent Labs  Lab 06/22/19 2150 06/23/19 0756  GLUCAP 361* 243*    Iron Studies: No results for input(s): IRON, TIBC, TRANSFERRIN, FERRITIN in the last 168 hours.  Xrays/Other Studies: US Venous Img Lower Bilateral  Result Date: 06/22/2019 CLINICAL DATA:  Bilateral lower extremity pain and edema. Evaluate for DVT. EXAM: BILATERAL LOWER EXTREMITY VENOUS DOPPLER ULTRASOUND TECHNIQUE: Gray-scale sonography with graded compression, as well as color  Doppler and duplex ultrasound were performed to evaluate the lower extremity deep venous systems from the level of the common femoral vein and including the common femoral, femoral, profunda femoral, popliteal and calf veins including the posterior tibial, peroneal and gastrocnemius veins when visible.  The superficial great saphenous vein was also interrogated. Spectral Doppler was utilized to evaluate flow at rest and with distal augmentation maneuvers in the common femoral, femoral and popliteal veins. COMPARISON:  None. FINDINGS: RIGHT LOWER EXTREMITY Common Femoral Vein: No evidence of thrombus. Normal compressibility, respiratory phasicity and response to augmentation. Saphenofemoral Junction: No evidence of thrombus. Normal compressibility and flow on color Doppler imaging. Profunda Femoral Vein: No evidence of thrombus. Normal compressibility and flow on color Doppler imaging. Femoral Vein: No evidence of thrombus. Normal compressibility, respiratory phasicity and response to augmentation. Popliteal Vein: No evidence of thrombus. Normal compressibility, respiratory phasicity and response to augmentation. Calf Veins: No evidence of thrombus. Normal compressibility and flow on color Doppler imaging. Superficial Great Saphenous Vein: No evidence of thrombus. Normal compressibility. Venous Reflux:  None. Other Findings:  None. LEFT LOWER EXTREMITY Common Femoral Vein: No evidence of thrombus. Normal compressibility, respiratory phasicity and response to augmentation. Saphenofemoral Junction: No evidence of thrombus. Normal compressibility and flow on color Doppler imaging. Profunda Femoral Vein: No evidence of thrombus. Normal compressibility and flow on color Doppler imaging. Femoral Vein: No evidence of thrombus. Normal compressibility, respiratory phasicity and response to augmentation. Popliteal Vein: No evidence of thrombus. Normal compressibility, respiratory phasicity and response to augmentation. Calf Veins: No evidence of thrombus. Normal compressibility and flow on color Doppler imaging. Superficial Great Saphenous Vein: No evidence of thrombus. Normal compressibility. Venous Reflux:  None. Other Findings:  None. IMPRESSION: No evidence of DVT within either lower extremity. Electronically Signed   By:  Sandi Mariscal M.D.   On: 06/22/2019 14:52   Dg Chest Port 1 View  Result Date: 06/22/2019 CLINICAL DATA:  Pt c/o swelling in lower extremities x several days. Hx CHF, obesity, diabetes, nonsmoker EXAM: PORTABLE CHEST 1 VIEW COMPARISON:  Chest radiographs dated 04/28/2019, 02/23/2019 FINDINGS: Stable cardiomediastinal contours with enlarged heart size. Central vascular congestion without overt edema. No pneumothorax or pleural effusion. Visualized skeleton is unremarkable. IMPRESSION: Cardiomegaly and central vascular congestion without overt edema. Electronically Signed   By: Audie Pinto M.D.   On: 06/22/2019 10:53     Assessment/Plan: 1.  AKI/CKD in setting of decompensated diastolic CHF and escalating diuretic dosage.  No evidence of uremia and pt resistant to considering dialysis at this time.  Continue with bp control, diuresis, and follow. 2. Anasarca/acute on chronic diastolic CHF- Started on IV lasix with marked improvement of UOP of over 600 ml this morning.  Agree with cardiology consult and ECHO for added information.  His urine prot/creat ratio is markedly elevated at 6.3 mg/g, however albumin is only mildly decreased at 3.1 (consistent with DN and obesity related FSGS).  Continue with IV lasix and follow I's/O's, daily weights.   3. Malignant HTN- poorly controlled with unrevealing secondary HTN workup but pending MRA of renal arteries.  He was seen by [name] who ordered test, will need to see if Radiology can do a non-contrasted MRA of renal arteries with good image quality.   1. For now will increase labetalol and follow BP's.   2. Also should improve with diuresis. 4. CKD stage 4- has been progressive due to poorly controlled HTN and DM.  Not sure kidney biopsy would be helpful given chronicity and  severity of CKD at this point.  I spent time discussing the severity of his CKD as well as stage.  We also discussed HD, home HD, PD, and renal transplantation.  Will have him watch  educational video tapes regarding dialysis.  We also discussed the ways to delay the progression of CKD with  1. Tight BP control (goal BP <130/80) 2. Tight glucose control (goal Hgb A1c of <7%) 3. Weight loss (especially for BP, DM, and obesity related FSGS) 4. Avoidance of nephrotoxic agents such as NSAIDs, COX-II I's, IV contrast 5. Use of ACE/ARB, however given ongoing aggressive diuresis, AKI/CKD, and his unwillingness to consider HD at this time, risks outweigh benefits. 6. Continue with education and encouragement.  He will likely agree to dialysis if/when it is indicated.  5. DM- poorly controlled with Hgb A1c of 12.4%.  Changes to regimen per primary svc. 6. Hypokalemia- replete and follow 7. Anemia of CKD- stable 8. Obesity- consider dietary consult to help with DM control and weight loss 9. Hyperuricemia- consider allopurinol or uloric.   Governor Rooks Jamont Mellin 06/23/2019, 9:54 AM

## 2019-06-24 DIAGNOSIS — R600 Localized edema: Secondary | ICD-10-CM

## 2019-06-24 DIAGNOSIS — I5033 Acute on chronic diastolic (congestive) heart failure: Secondary | ICD-10-CM

## 2019-06-24 LAB — GLUCOSE, CAPILLARY
Glucose-Capillary: 216 mg/dL — ABNORMAL HIGH (ref 70–99)
Glucose-Capillary: 312 mg/dL — ABNORMAL HIGH (ref 70–99)
Glucose-Capillary: 241 mg/dL — ABNORMAL HIGH (ref 70–99)
Glucose-Capillary: 233 mg/dL — ABNORMAL HIGH (ref 70–99)

## 2019-06-24 LAB — RENAL FUNCTION PANEL
Albumin: 2.8 g/dL — ABNORMAL LOW (ref 3.5–5.0)
Anion gap: 11 (ref 5–15)
BUN: 55 mg/dL — ABNORMAL HIGH (ref 6–20)
CO2: 26 mmol/L (ref 22–32)
Calcium: 7.4 mg/dL — ABNORMAL LOW (ref 8.9–10.3)
Chloride: 101 mmol/L (ref 98–111)
Creatinine, Ser: 5.14 mg/dL — ABNORMAL HIGH (ref 0.61–1.24)
GFR calc Af Amer: 14 mL/min — ABNORMAL LOW (ref >60)
GFR calc non Af Amer: 12 mL/min — ABNORMAL LOW (ref >60)
Glucose, Bld: 226 mg/dL — ABNORMAL HIGH (ref 70–99)
Phosphorus: 6.1 mg/dL — ABNORMAL HIGH (ref 2.5–4.6)
Potassium: 3.1 mmol/L — ABNORMAL LOW (ref 3.5–5.1)
Sodium: 138 mmol/L (ref 135–145)

## 2019-06-24 LAB — CREATININE CLEARANCE, URINE, 24 HOUR
Collection Interval-CRCL: 24 hours
Creatinine Clearance: 31 mL/min — ABNORMAL LOW (ref 75–125)
Creatinine, 24H Ur: 2318 mg/d — ABNORMAL HIGH (ref 800–2000)
Creatinine, Urine: 74.77 mg/dL
Urine Total Volume-CRCL: 3100 mL

## 2019-06-24 MED ORDER — INSULIN ASPART 100 UNIT/ML ~~LOC~~ SOLN
4.0000 [IU] | Freq: Three times a day (TID) | SUBCUTANEOUS | Status: DC
  Administered 2019-06-24 – 2019-06-27 (×9): 4 [IU] via SUBCUTANEOUS

## 2019-06-24 MED ORDER — INSULIN ASPART 100 UNIT/ML ~~LOC~~ SOLN
0.0000 [IU] | Freq: Three times a day (TID) | SUBCUTANEOUS | Status: DC
  Administered 2019-06-24 – 2019-06-25 (×2): 7 [IU] via SUBCUTANEOUS
  Administered 2019-06-25: 11 [IU] via SUBCUTANEOUS
  Administered 2019-06-26: 12:00:00 7 [IU] via SUBCUTANEOUS
  Administered 2019-06-26: 3 [IU] via SUBCUTANEOUS
  Administered 2019-06-26 – 2019-06-27 (×3): 4 [IU] via SUBCUTANEOUS

## 2019-06-24 MED ORDER — CARVEDILOL 12.5 MG PO TABS
12.5000 mg | ORAL_TABLET | Freq: Two times a day (BID) | ORAL | Status: DC
  Administered 2019-06-25 – 2019-06-27 (×5): 12.5 mg via ORAL
  Filled 2019-06-24 (×5): qty 1

## 2019-06-24 MED ORDER — INSULIN ASPART 100 UNIT/ML ~~LOC~~ SOLN
0.0000 [IU] | Freq: Every day | SUBCUTANEOUS | Status: DC
  Administered 2019-06-24 – 2019-06-25 (×2): 2 [IU] via SUBCUTANEOUS
  Administered 2019-06-26: 3 [IU] via SUBCUTANEOUS

## 2019-06-24 MED ORDER — INSULIN DETEMIR 100 UNIT/ML ~~LOC~~ SOLN
28.0000 [IU] | Freq: Two times a day (BID) | SUBCUTANEOUS | Status: DC
  Administered 2019-06-24 – 2019-06-25 (×2): 28 [IU] via SUBCUTANEOUS
  Filled 2019-06-24 (×6): qty 0.28

## 2019-06-24 NOTE — Progress Notes (Signed)
[facility] Progress Note    Assessment/ Plan:   1.  AKI/CKD in setting of decompensated diastolic CHF and escalating diuretic dosage.  BL creatinine appears to be in the 3.6-4 range with e/o continued progression since mid 2019. No evidence of uremia and pt resistant to considering dialysis at this time; I spent an extensive amount of time educating the patient. He is not sure he wants to start dialysis if it became necessary but is also not refusing it; he will think about it over the weekend.   - Renal u/s neg for hydro.  - Strict I&O's with floor weights please. I&O not reliable at this point without a foley but pt states that he is urinating a lot.  - Continue with bp control, diuresis, and will continue to follow. If everything is stable then will see Monday.  2. Anasarca/acute on chronic diastolic CHF-  Started on IV lasix with marked improvement of UOP  of over 600 ml this morning.  Agree with cardiology consult and ECHO for added information.  His urine prot/creat ratio is markedly elevated at 6.3 mg/g, however albumin is only mildly decreased at 3.1 (consistent with DN and obesity related FSGS).  I would continue with IV lasix and follow I's/O's, daily weights -> he is still massively overloaded.   3. Malignant HTN- poorly controlled with unrevealing secondary HTN workup but pending MRA of renal arteries.  He was seen by [name] who ordered test, will need to see if Radiology can do a non-contrasted MRA of renal arteries with good image quality.   1. For now will increase labetalol and follow BP's.   2. Also should improve with diuresis. 4. CKD stage 4- has been progressive due to poorly controlled HTN and DM.  Not sure kidney biopsy would be helpful given chronicity and severity of CKD at this point.  I spent time discussing the severity of his CKD as well as stage.  We also discussed HD, home HD, PD, and renal transplantation.  Will have him watch educational video tapes  regarding dialysis.  We also discussed the ways to delay the progression of CKD with  1. Tight BP control (goal BP <130/80) 2. Tight glucose control (goal Hgb A1c of <7%) 3. Weight loss (especially for BP, DM, and obesity related FSGS) 4. Avoidance of nephrotoxic agents such as NSAIDs, COX-II I's, IV contrast 5. Use of ACE/ARB, however given ongoing aggressive diuresis, AKI/CKD, and his unwillingness to consider HD at this time, risks outweigh benefits. 6. Continue with education and encouragement; I spent a considerable amt of time educating today but most likely he will need continued education. From what he told me today most likely he will agree to dialysis when it is indicated. If we confirm this by Monday I would obtain a vascular consult for access placement. 5. DM- poorly controlled with Hgb A1c of 12.4%.  Changes to regimen per primary svc. 6. Hypokalemia- replete and follow 7. Anemia of CKD- stable 8. Obesity- consider dietary consult to help with DM control and weight loss 9. Hyperuricemia- consider allopurinol or uloric.  Subjective:   Denies dyspnea/CP/ f/c/n/v.  Good appetite. Voiding frequently.   Objective:   BP (!) 139/95 (BP Location: Left Arm)   Pulse 74   Temp 98.1 F (36.7 C)   Resp 17   Ht 5\' 7"  (1.702 m)   Wt (!) 141.8 kg   SpO2 100%   BMI 48.96 kg/m   Intake/Output Summary (Last 24 hours) at  06/24/2019 1158 Last data filed at 06/24/2019 0500 Gross per 24 hour  Intake 960 ml  Output 1750 ml  Net -790 ml   Weight change: 1.185 kg  Physical Exam: General appearance: NAD, moderately obese Head: NCAT Eyes: no conjunctival pallor Resp: CTA b/l Cardio: regular rate and rhythm GI: SNDNT Extremities: edema 2-3+ edema  Imaging: US Renal  Result Date: 06/23/2019 CLINICAL DATA:  Acute renal failure EXAM: RENAL / URINARY TRACT ULTRASOUND COMPLETE COMPARISON:  None. FINDINGS: Right Kidney: Renal measurements: 11.7 x 4.8 x 6.1 cm = volume: 177 mL .  Echogenicity within normal limits. No mass or hydronephrosis visualized. Left Kidney: Renal measurements: 11.1 x 5.3 x 5.9 cm = volume: 181 mL. Echogenicity within normal limits. No mass or hydronephrosis visualized. Bladder: Appears normal for degree of bladder distention. IMPRESSION: Normal bilateral renal ultrasound. Electronically Signed   By: Kathreen Devoid   On: 06/23/2019 11:46   US Venous Img Lower Bilateral  Result Date: 06/22/2019 CLINICAL DATA:  Bilateral lower extremity pain and edema. Evaluate for DVT. EXAM: BILATERAL LOWER EXTREMITY VENOUS DOPPLER ULTRASOUND TECHNIQUE: Gray-scale sonography with graded compression, as well as color Doppler and duplex ultrasound were performed to evaluate the lower extremity deep venous systems from the level of the common femoral vein and including the common femoral, femoral, profunda femoral, popliteal and calf veins including the posterior tibial, peroneal and gastrocnemius veins when visible. The superficial great saphenous vein was also interrogated. Spectral Doppler was utilized to evaluate flow at rest and with distal augmentation maneuvers in the common femoral, femoral and popliteal veins. COMPARISON:  None. FINDINGS: RIGHT LOWER EXTREMITY Common Femoral Vein: No evidence of thrombus. Normal compressibility, respiratory phasicity and response to augmentation. Saphenofemoral Junction: No evidence of thrombus. Normal compressibility and flow on color Doppler imaging. Profunda Femoral Vein: No evidence of thrombus. Normal compressibility and flow on color Doppler imaging. Femoral Vein: No evidence of thrombus. Normal compressibility, respiratory phasicity and response to augmentation. Popliteal Vein: No evidence of thrombus. Normal compressibility, respiratory phasicity and response to augmentation. Calf Veins: No evidence of thrombus. Normal compressibility and flow on color Doppler imaging. Superficial Great Saphenous Vein: No evidence of thrombus. Normal  compressibility. Venous Reflux:  None. Other Findings:  None. LEFT LOWER EXTREMITY Common Femoral Vein: No evidence of thrombus. Normal compressibility, respiratory phasicity and response to augmentation. Saphenofemoral Junction: No evidence of thrombus. Normal compressibility and flow on color Doppler imaging. Profunda Femoral Vein: No evidence of thrombus. Normal compressibility and flow on color Doppler imaging. Femoral Vein: No evidence of thrombus. Normal compressibility, respiratory phasicity and response to augmentation. Popliteal Vein: No evidence of thrombus. Normal compressibility, respiratory phasicity and response to augmentation. Calf Veins: No evidence of thrombus. Normal compressibility and flow on color Doppler imaging. Superficial Great Saphenous Vein: No evidence of thrombus. Normal compressibility. Venous Reflux:  None. Other Findings:  None. IMPRESSION: No evidence of DVT within either lower extremity. Electronically Signed   By: Sandi Mariscal M.D.   On: 06/22/2019 14:52    Labs: BMET Recent Labs  Lab 06/22/19 1052 06/23/19 0625 06/23/19 1540 06/24/19 0726  NA 137 140  --  138  K 3.0* 3.4*  --  3.1*  CL 101 103  --  101  CO2 25 24  --  26  GLUCOSE 224* 273* 354* 226*  BUN 61* 55*  --  55*  CREATININE 4.87* 4.93*  --  5.14*  CALCIUM 7.1* 7.6*  --  7.4*  PHOS  --  5.6*  --  6.1*   CBC Recent Labs  Lab 06/22/19 1052 06/23/19 0625  WBC 7.2 6.1  NEUTROABS 2.4  --   HGB 11.0* 11.6*  HCT 35.0* 37.3*  MCV 76.6* 78.2*  PLT 290 295    Medications:    . amLODipine  10 mg Oral Daily  . atorvastatin  80 mg Oral Daily  . cloNIDine  0.3 mg Oral TID  . ezetimibe  10 mg Oral QPM  . furosemide  80 mg Intravenous TID  . gabapentin  100 mg Oral TID  . heparin  5,000 Units Subcutaneous Q8H  . insulin aspart  0-15 Units Subcutaneous TID WC  . insulin aspart  0-5 Units Subcutaneous QHS  . insulin detemir  20 Units Subcutaneous BID  . labetalol  300 mg Oral TID  . linaclotide   145 mcg Oral QAC breakfast  . potassium chloride SA  20 mEq Oral Daily  . sodium bicarbonate  650 mg Oral BID      Otelia Santee, MD 06/24/2019, 11:58 AM

## 2019-06-24 NOTE — Care Management Important Message (Signed)
Important Message  Patient Details  Name: Kelly Key MRN: [id number] Date of Birth: [birth year]   Medicare Important Message Given:  Yes     [name] 06/24/2019, 2:56 PM

## 2019-06-24 NOTE — Progress Notes (Signed)
Results for WOLFGANG, BOVEE (MRN [id number]) as of 06/24/2019 08:53  Ref. Range 06/23/2019 17:16 06/23/2019 19:57 06/23/2019 21:59 06/23/2019 23:58 06/24/2019 07:49  Glucose-Capillary Latest Ref Range: 70 - 99 mg/dL 336 (H) 403 (H) 335 (H) 303 (H) 216 (H)  Noted that blood sugars continue to be greater than 300 mg/dl.   Recommend increasing Levemir to 25 units BID, increase Novolog correction scale to RESISTANT TID & HS scale, and add Novolog 5 units TID with meals if eats at least 50% of meal.   Will continue to monitor blood sugars while in the hospital.  Harvel Ricks RN BSN CDE Diabetes Coordinator Pager: [phone]  8am-5pm

## 2019-06-24 NOTE — Progress Notes (Signed)
PROGRESS NOTE  Kelly Key [id number] DOB: 12-18-1970 DOA: 06/22/2019 PCP: [name], MD  Brief History:  48 y.o.malewith medical history ofhypertension, CKD stage IV, diastolic CHF, dyslipidemia, and gouty arthritis presenting with 2 to 3-week history of increasing lower extremity edema and increasing abdominal girth. He states that his symptoms have worsened in the past 2 to 3 days. He denies any fevers, chills, chest pain, shortness breath, cough, hemoptysis, nausea, vomiting, diarrhea, abdominal pain, dysuria, hematuria. He endorses compliance with his torsemide which he takes 60 mg in the a.m. and 40 mg in the p.m. He states that he has been on this dose for the last 2 months. He denies drinking any excess fluid or other dietary indiscretions. Because of his increasing leg heaviness, the patient states that he has been having difficulty getting around with leg weakness. He denies any back pain or recent falls. He denies any NSAID use. The patient recently had a renal artery ultrasound duplex which was negative for renal artery stenosis. He was supposed to be scheduled for an MRI of his renal arteries in the future by Dr. Juanda Crumble fields. In the emergency department, the patient was afebrile hemodynamically stable saturating 100% on room air. BMP showed a potassium of 3.0 and serum creatinine of 4.87. LFTs were unremarkable. WBC was 7.2. Chest x-ray showed cardiomegaly with some vascular congestion.  He was admitted for management of his fluid overload and anasarca.  Assessment/Plan: Anasarca/Fluid overload/acute on chronic renal failure--CKD stage IV/Acute diastolic CHF -Suspect the patient likely has continued progression of his underlying kidney disease -Patient likely has underlying nephrotic syndrome and FSGS -Urine protein creatinine ratio--6.2 grams in June 2019 -The patient had been adamant about not pursuing dialysis, but may be having a change of heart at  this point -Nephrology consult appreciated -Previous baseline creatinine 3.6-3.9 in April 2020 -Presented with serum creatinine 4.7 -renal US--neg for hydronephrosis -Lasix IV 80 mg twice daily-->increase to tid -Echo--EF 50-55%, normal RV, trivial TR, aortic arch 40 mm -daily weights (EF 60-65% on 03/27/18) -I/Os no accurate without foley  Uncontrolled diabetes mellitus type 2 with hyperglycemia -06/23/19-Hemoglobin A1c--12.1 -NovoLog sliding scale--increase to resistant scale -Continue Levemir-->increase to 28 units bid -add novolog 4 units with meals  Essential hypertension, uncontrolled -Continue amlodipine, clonidine, labetalol -increased labetalol to 300 mg tid>>>transition to coreg  Hyperlipidemia -Continue statin  Leg edema/pain -venous duplex--neg  Hypokalemia -repleted  Hyperuricemia -continue allopurinol       Disposition Plan:   Home in 2-3 days  Family Communication:  No Family at bedside  Consultants:  renal  Code Status:  FULL   DVT Prophylaxis:  [place] Heparin    Procedures: As Listed in Progress Note Above  Antibiotics: None        Subjective: Pt still complains of leg edema but feels it is slowly improving.  He feels he has increased urine output.  Patient denies fevers, chills, headache, chest pain, dyspnea, nausea, vomiting, diarrhea, abdominal pain, dysuria, hematuria, hematochezia, and melena.   Objective: Vitals:   06/23/19 1602 06/23/19 2201 06/24/19 0500 06/24/19 0529  BP: (!) 145/89 137/89  (!) 139/95  Pulse: 84 75  74  Resp: 20 17  17   Temp: 97.8 F (36.6 C) 97.7 F (36.5 C)  98.1 F (36.7 C)  TempSrc: Oral Oral    SpO2: 100% 96%  100%  Weight:   (!) 141.8 kg   Height:        Intake/Output  Summary (Last 24 hours) at 06/24/2019 1701 Last data filed at 06/24/2019 0500 Gross per 24 hour  Intake 960 ml  Output 1750 ml  Net -790 ml   Weight change: 1.185 kg Exam:   General:  Pt is alert,  follows commands appropriately, not in acute distress  HEENT: No icterus, No thrush, No neck mass, [place]/AT  Cardiovascular: RRR, S1/S2, no rubs, no gallops  Respiratory: CTA bilaterally, no wheezing, no crackles, no rhonchi  Abdomen: Soft/+BS, non tender, non distended, no guarding  Extremities: 2 + LE edema, No lymphangitis, No petechiae, No rashes, no synovitis   Data Reviewed: I have personally reviewed following labs and imaging studies Basic Metabolic Panel: Recent Labs  Lab 06/22/19 1052 06/23/19 0625 06/23/19 1540 06/24/19 0726  NA 137 140  --  138  K 3.0* 3.4*  --  3.1*  CL 101 103  --  101  CO2 25 24  --  26  GLUCOSE 224* 273* 354* 226*  BUN 61* 55*  --  55*  CREATININE 4.87* 4.93*  --  5.14*  CALCIUM 7.1* 7.6*  --  7.4*  PHOS  --  5.6*  --  6.1*   Liver Function Tests: Recent Labs  Lab 06/22/19 1052 06/23/19 0625 06/24/19 0726  AST 21  --   --   ALT 21  --   --   ALKPHOS 107  --   --   BILITOT 0.3  --   --   PROT 6.9  --   --   ALBUMIN 3.1* 3.1* 2.8*   No results for input(s): LIPASE, AMYLASE in the last 168 hours. No results for input(s): AMMONIA in the last 168 hours. Coagulation Profile: No results for input(s): INR, PROTIME in the last 168 hours. CBC: Recent Labs  Lab 06/22/19 1052 06/23/19 0625  WBC 7.2 6.1  NEUTROABS 2.4  --   HGB 11.0* 11.6*  HCT 35.0* 37.3*  MCV 76.6* 78.2*  PLT 290 295   Cardiac Enzymes: No results for input(s): CKTOTAL, CKMB, CKMBINDEX, TROPONINI in the last 168 hours. BNP: Invalid input(s): POCBNP CBG: Recent Labs  Lab 06/23/19 1957 06/23/19 2159 06/23/19 2358 06/24/19 0749 06/24/19 1134  GLUCAP 403* 335* 303* 216* 312*   HbA1C: Recent Labs    06/22/19 1053 06/23/19 0625  HGBA1C 12.4* 12.1*   Urine analysis:    Component Value Date/Time   COLORURINE STRAW (A) 06/23/2019 0500   APPEARANCEUR CLEAR 06/23/2019 0500   LABSPEC 1.010 06/23/2019 0500   PHURINE 6.0 06/23/2019 0500   GLUCOSEU >=500 (A)  06/23/2019 0500   HGBUR SMALL (A) 06/23/2019 0500   BILIRUBINUR NEGATIVE 06/23/2019 0500   KETONESUR NEGATIVE 06/23/2019 0500   PROTEINUR >=300 (A) 06/23/2019 0500   UROBILINOGEN 0.2 06/11/2012 0518   NITRITE NEGATIVE 06/23/2019 0500   LEUKOCYTESUR NEGATIVE 06/23/2019 0500   Sepsis Labs: @LABRCNTIP (procalcitonin:4,lacticidven:4) ) Recent Results (from the past 240 hour(s))  SARS CORONAVIRUS 2 (Isabelly Kobler 6-12 HRS) Nasal Swab Aptima Multi Swab     Status: None   Collection Time: 06/22/19 12:30 PM   Specimen: Aptima Multi Swab; Nasal Swab  Result Value Ref Range Status   SARS Coronavirus 2 NEGATIVE NEGATIVE Final    Comment: (NOTE) SARS-CoV-2 target nucleic acids are NOT DETECTED. The SARS-CoV-2 RNA is generally detectable in upper and lower respiratory specimens during the acute phase of infection. Negative results do not preclude SARS-CoV-2 infection, do not rule out co-infections with other pathogens, and should not be used as the sole basis for treatment  or other patient management decisions. Negative results must be combined with clinical observations, patient history, and epidemiological information. The expected result is Negative. Fact Sheet for Patients: SugarRoll.be Fact Sheet for Healthcare Providers: https://www.woods-mathews.com/ This test is not yet approved or cleared by the [place] FDA and  has been authorized for detection and/or diagnosis of SARS-CoV-2 by FDA under an Emergency Use Authorization (EUA). This EUA will remain  in effect (meaning this test can be used) for the duration of the COVID-19 declaration under Section 56 4(b)(1) of the Act, 21 U.S.C. section 360bbb-3(b)(1), unless the authorization is terminated or revoked sooner. Performed at [facility], [place] [address] [place], [place] [address]      Scheduled Meds:  amLODipine  10 mg Oral Daily   atorvastatin  80 mg Oral Daily   cloNIDine  0.3 mg  Oral TID   ezetimibe  10 mg Oral QPM   furosemide  80 mg Intravenous TID   gabapentin  100 mg Oral TID   heparin  5,000 Units Subcutaneous Q8H   insulin aspart  0-20 Units Subcutaneous TID WC   insulin aspart  0-5 Units Subcutaneous QHS   insulin aspart  4 Units Subcutaneous TID WC   insulin detemir  28 Units Subcutaneous BID   labetalol  300 mg Oral TID   linaclotide  145 mcg Oral QAC breakfast   potassium chloride SA  20 mEq Oral Daily   sodium bicarbonate  650 mg Oral BID   Continuous Infusions:  Procedures/Studies: US Renal  Result Date: 06/23/2019 CLINICAL DATA:  Acute renal failure EXAM: RENAL / URINARY TRACT ULTRASOUND COMPLETE COMPARISON:  None. FINDINGS: Right Kidney: Renal measurements: 11.7 x 4.8 x 6.1 cm = volume: 177 mL . Echogenicity within normal limits. No mass or hydronephrosis visualized. Left Kidney: Renal measurements: 11.1 x 5.3 x 5.9 cm = volume: 181 mL. Echogenicity within normal limits. No mass or hydronephrosis visualized. Bladder: Appears normal for degree of bladder distention. IMPRESSION: Normal bilateral renal ultrasound. Electronically Signed   By: Kathreen Devoid   On: 06/23/2019 11:46   US Venous Img Lower Bilateral  Result Date: 06/22/2019 CLINICAL DATA:  Bilateral lower extremity pain and edema. Evaluate for DVT. EXAM: BILATERAL LOWER EXTREMITY VENOUS DOPPLER ULTRASOUND TECHNIQUE: Gray-scale sonography with graded compression, as well as color Doppler and duplex ultrasound were performed to evaluate the lower extremity deep venous systems from the level of the common femoral vein and including the common femoral, femoral, profunda femoral, popliteal and calf veins including the posterior tibial, peroneal and gastrocnemius veins when visible. The superficial great saphenous vein was also interrogated. Spectral Doppler was utilized to evaluate flow at rest and with distal augmentation maneuvers in the common femoral, femoral and popliteal veins.  COMPARISON:  None. FINDINGS: RIGHT LOWER EXTREMITY Common Femoral Vein: No evidence of thrombus. Normal compressibility, respiratory phasicity and response to augmentation. Saphenofemoral Junction: No evidence of thrombus. Normal compressibility and flow on color Doppler imaging. Profunda Femoral Vein: No evidence of thrombus. Normal compressibility and flow on color Doppler imaging. Femoral Vein: No evidence of thrombus. Normal compressibility, respiratory phasicity and response to augmentation. Popliteal Vein: No evidence of thrombus. Normal compressibility, respiratory phasicity and response to augmentation. Calf Veins: No evidence of thrombus. Normal compressibility and flow on color Doppler imaging. Superficial Great Saphenous Vein: No evidence of thrombus. Normal compressibility. Venous Reflux:  None. Other Findings:  None. LEFT LOWER EXTREMITY Common Femoral Vein: No evidence of thrombus. Normal compressibility, respiratory phasicity and response to  augmentation. Saphenofemoral Junction: No evidence of thrombus. Normal compressibility and flow on color Doppler imaging. Profunda Femoral Vein: No evidence of thrombus. Normal compressibility and flow on color Doppler imaging. Femoral Vein: No evidence of thrombus. Normal compressibility, respiratory phasicity and response to augmentation. Popliteal Vein: No evidence of thrombus. Normal compressibility, respiratory phasicity and response to augmentation. Calf Veins: No evidence of thrombus. Normal compressibility and flow on color Doppler imaging. Superficial Great Saphenous Vein: No evidence of thrombus. Normal compressibility. Venous Reflux:  None. Other Findings:  None. IMPRESSION: No evidence of DVT within either lower extremity. Electronically Signed   By: Sandi Mariscal M.D.   On: 06/22/2019 14:52   Dg Chest Port 1 View  Result Date: 06/22/2019 CLINICAL DATA:  Pt c/o swelling in lower extremities x several days. Hx CHF, obesity, diabetes, nonsmoker EXAM:  PORTABLE CHEST 1 VIEW COMPARISON:  Chest radiographs dated 04/28/2019, 02/23/2019 FINDINGS: Stable cardiomediastinal contours with enlarged heart size. Central vascular congestion without overt edema. No pneumothorax or pleural effusion. Visualized skeleton is unremarkable. IMPRESSION: Cardiomegaly and central vascular congestion without overt edema. Electronically Signed   By: Audie Pinto M.D.   On: 06/22/2019 10:53   Vas US Renal Artery Duplex  Result Date: 06/16/2019 ABDOMINAL VISCERAL High Risk Factors: Hypertension, hyperlipidemia, Diabetes, no history of                    smoking. Other Factors: Limited study secondary to obesity and bowel gas. Limitations: Air/bowel gas and obesity. Performing Technologist: Delorise Shiner RVT  Examination Guidelines: A complete evaluation includes B-mode imaging, spectral Doppler, color Doppler, and power Doppler as needed of all accessible portions of each vessel. Bilateral testing is considered an integral part of a complete examination. Limited examinations for reoccurring indications may be performed as noted.  Duplex Findings: +----------+--------+--------+------+--------+  Mesenteric PSV cm/s EDV cm/s Plaque Comments  +----------+--------+--------+------+--------+  Aorta Mid     58                              +----------+--------+--------+------+--------+  +------------------+--------+--------+-------+  Right Renal Artery PSV cm/s EDV cm/s Comment  +------------------+--------+--------+-------+  Proximal              25                      +------------------+--------+--------+-------+  Mid                   26                      +------------------+--------+--------+-------+  Distal                27                      +------------------+--------+--------+-------+ +-----------------+--------+--------+-------+  Left Renal Artery PSV cm/s EDV cm/s Comment  +-----------------+--------+--------+-------+  Origin               40                       +-----------------+--------+--------+-------+  Proximal             29                      +-----------------+--------+--------+-------+  Mid  27                      +-----------------+--------+--------+-------+  Distal               23                      +-----------------+--------+--------+-------+ +------------+--------+--------+--+-----------+--------+--------+---+  Right Kidney PSV cm/s EDV cm/s RI Left Kidney PSV cm/s EDV cm/s RI   +------------+--------+--------+--+-----------+--------+--------+---+  Upper Pole                        Upper Pole                         +------------+--------+--------+--+-----------+--------+--------+---+  Mid                               Mid                                +------------+--------+--------+--+-----------+--------+--------+---+  Lower Pole                        Lower Pole                         +------------+--------+--------+--+-----------+--------+--------+---+  Hilar                             Hilar                              +------------+--------+--------+--+-----------+--------+--------+---+ +------------------+-------+------------------+-------+  Right Kidney               Left Kidney                 +------------------+-------+------------------+-------+  RAR                        RAR                         +------------------+-------+------------------+-------+  RAR (manual)               RAR (manual)                +------------------+-------+------------------+-------+  Cortex                     Cortex                      +------------------+-------+------------------+-------+  Cortex thickness   1.88 mm Corex thickness    1.84 mm  +------------------+-------+------------------+-------+  Kidney length (cm) 13.27   Kidney length (cm) 10.24    +------------------+-------+------------------+-------+  Summary: Renal:  Right: Normal size right kidney. No evidence of right renal artery        stenosis. RRV flow present. Limited  study secondary to body        habitus. Could not insonnate right renal artery origin. Left:  Normal size of left kidney. No evidence of left renal artery        stenosis. LRV flow present. Limited study secondary to body        habitus.  *See table(s) above  for measurements and observations.  Diagnosing physician: Ruta Hinds MD  Electronically signed by Ruta Hinds MD on 06/16/2019 at 10:43:24 AM.    Final     Orson Eva, DO  [facility] Pager [phone]  If 7PM-7AM, please contact night-coverage [email or web address] Password [email or web address] 06/24/2019, 5:01 PM   LOS: 2 days

## 2019-06-25 LAB — GLUCOSE, CAPILLARY
Glucose-Capillary: 205 mg/dL — ABNORMAL HIGH (ref 70–99)
Glucose-Capillary: 270 mg/dL — ABNORMAL HIGH (ref 70–99)
Glucose-Capillary: 117 mg/dL — ABNORMAL HIGH (ref 70–99)
Glucose-Capillary: 204 mg/dL — ABNORMAL HIGH (ref 70–99)
Glucose-Capillary: 171 mg/dL — ABNORMAL HIGH (ref 70–99)

## 2019-06-25 LAB — RENAL FUNCTION PANEL
Albumin: 3 g/dL — ABNORMAL LOW (ref 3.5–5.0)
Anion gap: 12 (ref 5–15)
BUN: 54 mg/dL — ABNORMAL HIGH (ref 6–20)
CO2: 26 mmol/L (ref 22–32)
Calcium: 7.7 mg/dL — ABNORMAL LOW (ref 8.9–10.3)
Chloride: 99 mmol/L (ref 98–111)
Creatinine, Ser: 5.27 mg/dL — ABNORMAL HIGH (ref 0.61–1.24)
GFR calc Af Amer: 14 mL/min — ABNORMAL LOW (ref >60)
GFR calc non Af Amer: 12 mL/min — ABNORMAL LOW (ref >60)
Glucose, Bld: 211 mg/dL — ABNORMAL HIGH (ref 70–99)
Phosphorus: 6.7 mg/dL — ABNORMAL HIGH (ref 2.5–4.6)
Potassium: 3.6 mmol/L (ref 3.5–5.1)
Sodium: 137 mmol/L (ref 135–145)

## 2019-06-25 MED ORDER — HEPARIN SODIUM (PORCINE) 5000 UNIT/ML IJ SOLN
5000.0000 [IU] | Freq: Three times a day (TID) | INTRAMUSCULAR | Status: DC
  Administered 2019-06-25 – 2019-06-27 (×6): 5000 [IU] via SUBCUTANEOUS
  Filled 2019-06-25 (×6): qty 1

## 2019-06-25 MED ORDER — INSULIN DETEMIR 100 UNIT/ML ~~LOC~~ SOLN
30.0000 [IU] | Freq: Two times a day (BID) | SUBCUTANEOUS | Status: DC
  Administered 2019-06-25 – 2019-06-27 (×4): 30 [IU] via SUBCUTANEOUS
  Filled 2019-06-25 (×6): qty 0.3

## 2019-06-25 NOTE — Progress Notes (Signed)
PROGRESS NOTE  Kelly Key [id number] DOB: [birth year] DOA: 06/22/2019 PCP: [name], MD  Brief History: 48 y.o.malewith medical history ofhypertension, CKD stage IV, diastolic CHF, dyslipidemia, and gouty arthritis presenting with 2 to 3-week history of increasing lower extremity edema and increasing abdominal girth. He states that his symptoms have worsened in the past 2 to 3 days. He denies any fevers, chills, chest pain, shortness breath, cough, hemoptysis, nausea, vomiting, diarrhea, abdominal pain, dysuria, hematuria. He endorses compliance with his torsemide which he takes 60 mg in the a.m. and 40 mg in the p.m. He states that he has been on this dose for the last 2 months. He denies drinking any excess fluid or other dietary indiscretions. Because of his increasing leg heaviness, the patient states that he has been having difficulty getting around with leg weakness. He denies any back pain or recent falls. He denies any NSAID use. The patient recently had a renal artery ultrasound duplex which was negative for renal artery stenosis. He was supposed to be scheduled for an MRI of his renal arteries in the future by Dr. Juanda Crumble fields. In the emergency department, the patient was afebrile hemodynamically stable saturating 100% on room air. BMP showed a potassium of 3.0 and serum creatinine of 4.87. LFTs were unremarkable. WBC was 7.2. Chest x-ray showed cardiomegaly with some vascular congestion.He was admitted for management of his fluid overload and anasarca.  Assessment/Plan: Anasarca/Fluid overload/acute on chronic renal failure--CKD stage IV/Acute diastolic CHF -Suspect the patient likely has continued progression of his underlying kidney disease -Patient likely has underlying nephrotic syndrome and FSGS -Urine protein creatinine ratio--6.2 grams in June 2019 -The patient had been adamant about not pursuing dialysis, but may be having a change of heart at  this point -Nephrology consultappreciated -Previous baseline creatinine 3.6-3.9 in April 2020 -Presented with serum creatinine 4.7 -renal US--neg for hydronephrosis -Lasix IV 80 mg twice daily-->increase to tid -Echo--EF 50-55%,  (EF 60-65% on 03/27/18)normal RV, trivial TR, aortic arch 40 mm -daily weights (8/29 standing weight 315.6)--I personally weighed pt -I/Os no accurate without foley  Uncontrolled diabetes mellitus type 2 with hyperglycemia -06/23/19-Hemoglobin A1c--12.1 -NovoLog sliding scale--increase to resistant scale -ContinueLevemir-->increase to 30 units bid -add novolog 4 units with meals  Essential hypertension, uncontrolled -Continue amlodipine, clonidine, labetalol -increased labetalol to 300 mg tid>>>transition to coreg  Hyperlipidemia -Continue statin  Leg edema/pain -venous duplex--neg  Hypokalemia -repleted  Hyperuricemia -continue allopurinol       Disposition Plan: Home in 2-3days  Family Communication:NoFamily at bedside  Consultants:renal  Code Status: FULL   DVT Prophylaxis: [place] Heparin    Procedures: As Listed in Progress Note Above  Antibiotics: None       Subjective: Patient denies fevers, chills, headache, chest pain, dyspnea, nausea, vomiting, diarrhea, abdominal pain, dysuria, hematuria, hematochezia, and melena. Feels legs have been slow to decrease in size  Objective: Vitals:   06/25/19 0500 06/25/19 0500 06/25/19 0814 06/25/19 0922  BP:  (!) 153/106  (!) 139/91  Pulse:  74  74  Resp:  18  16  Temp:  97.8 F (36.6 C)    TempSrc:      SpO2:  98% 98% 100%  Weight: (!) 143 kg     Height:        Intake/Output Summary (Last 24 hours) at 06/25/2019 1745 Last data filed at 06/25/2019 0900 Gross per 24 hour  Intake --  Output 1800 ml  Net -1800 ml  Weight change: 1.2 kg Exam:   General:  Pt is alert, follows commands appropriately, not in acute distress  HEENT: No icterus,  No thrush, No neck mass, [place]/AT  Cardiovascular: RRR, S1/S2, no rubs, no gallops  Respiratory: CTA bilaterally, no wheezing, no crackles, no rhonchi  Abdomen: Soft/+BS, non tender, non distended, no guarding  Extremities: 2 + LE edema, No lymphangitis, No petechiae, No rashes, no synovitis   Data Reviewed: I have personally reviewed following labs and imaging studies Basic Metabolic Panel: Recent Labs  Lab 06/22/19 1052 06/23/19 0625 06/23/19 1540 06/24/19 0726 06/25/19 0618  NA 137 140  --  138 137  K 3.0* 3.4*  --  3.1* 3.6  CL 101 103  --  101 99  CO2 25 24  --  26 26  GLUCOSE 224* 273* 354* 226* 211*  BUN 61* 55*  --  55* 54*  CREATININE 4.87* 4.93*  --  5.14* 5.27*  CALCIUM 7.1* 7.6*  --  7.4* 7.7*  PHOS  --  5.6*  --  6.1* 6.7*   Liver Function Tests: Recent Labs  Lab 06/22/19 1052 06/23/19 0625 06/24/19 0726 06/25/19 0618  AST 21  --   --   --   ALT 21  --   --   --   ALKPHOS 107  --   --   --   BILITOT 0.3  --   --   --   PROT 6.9  --   --   --   ALBUMIN 3.1* 3.1* 2.8* 3.0*   No results for input(s): LIPASE, AMYLASE in the last 168 hours. No results for input(s): AMMONIA in the last 168 hours. Coagulation Profile: No results for input(s): INR, PROTIME in the last 168 hours. CBC: Recent Labs  Lab 06/22/19 1052 06/23/19 0625  WBC 7.2 6.1  NEUTROABS 2.4  --   HGB 11.0* 11.6*  HCT 35.0* 37.3*  MCV 76.6* 78.2*  PLT 290 295   Cardiac Enzymes: No results for input(s): CKTOTAL, CKMB, CKMBINDEX, TROPONINI in the last 168 hours. BNP: Invalid input(s): POCBNP CBG: Recent Labs  Lab 06/24/19 1704 06/24/19 2014 06/25/19 0727 06/25/19 1132 06/25/19 1617  GLUCAP 241* 233* 205* 270* 117*   HbA1C: Recent Labs    06/23/19 0625  HGBA1C 12.1*   Urine analysis:    Component Value Date/Time   COLORURINE STRAW (A) 06/23/2019 0500   APPEARANCEUR CLEAR 06/23/2019 0500   LABSPEC 1.010 06/23/2019 0500   PHURINE 6.0 06/23/2019 0500   GLUCOSEU >=500  (A) 06/23/2019 0500   HGBUR SMALL (A) 06/23/2019 0500   BILIRUBINUR NEGATIVE 06/23/2019 0500   KETONESUR NEGATIVE 06/23/2019 0500   PROTEINUR >=300 (A) 06/23/2019 0500   UROBILINOGEN 0.2 06/11/2012 0518   NITRITE NEGATIVE 06/23/2019 0500   LEUKOCYTESUR NEGATIVE 06/23/2019 0500   Sepsis Labs: @LABRCNTIP (procalcitonin:4,lacticidven:4) ) Recent Results (from the past 240 hour(s))  SARS CORONAVIRUS 2 (Brodyn Depuy 6-12 HRS) Nasal Swab Aptima Multi Swab     Status: None   Collection Time: 06/22/19 12:30 PM   Specimen: Aptima Multi Swab; Nasal Swab  Result Value Ref Range Status   SARS Coronavirus 2 NEGATIVE NEGATIVE Final    Comment: (NOTE) SARS-CoV-2 target nucleic acids are NOT DETECTED. The SARS-CoV-2 RNA is generally detectable in upper and lower respiratory specimens during the acute phase of infection. Negative results do not preclude SARS-CoV-2 infection, do not rule out co-infections with other pathogens, and should not be used as the sole basis for treatment or other patient management decisions.  Negative results must be combined with clinical observations, patient history, and epidemiological information. The expected result is Negative. Fact Sheet for Patients: SugarRoll.be Fact Sheet for Healthcare Providers: https://www.woods-mathews.com/ This test is not yet approved or cleared by the [place] FDA and  has been authorized for detection and/or diagnosis of SARS-CoV-2 by FDA under an Emergency Use Authorization (EUA). This EUA will remain  in effect (meaning this test can be used) for the duration of the COVID-19 declaration under Section 56 4(b)(1) of the Act, 21 U.S.C. section 360bbb-3(b)(1), unless the authorization is terminated or revoked sooner. Performed at [facility], [place] [address], [place], [place] [address]      Scheduled Meds:  amLODipine  10 mg Oral Daily   atorvastatin  80 mg Oral Daily   carvedilol   12.5 mg Oral BID WC   cloNIDine  0.3 mg Oral TID   ezetimibe  10 mg Oral QPM   furosemide  80 mg Intravenous TID   gabapentin  100 mg Oral TID   heparin  5,000 Units Subcutaneous Q8H   insulin aspart  0-20 Units Subcutaneous TID WC   insulin aspart  0-5 Units Subcutaneous QHS   insulin aspart  4 Units Subcutaneous TID WC   insulin detemir  30 Units Subcutaneous BID   linaclotide  145 mcg Oral QAC breakfast   potassium chloride SA  20 mEq Oral Daily   Continuous Infusions:  Procedures/Studies: US Renal  Result Date: 06/23/2019 CLINICAL DATA:  Acute renal failure EXAM: RENAL / URINARY TRACT ULTRASOUND COMPLETE COMPARISON:  None. FINDINGS: Right Kidney: Renal measurements: 11.7 x 4.8 x 6.1 cm = volume: 177 mL . Echogenicity within normal limits. No mass or hydronephrosis visualized. Left Kidney: Renal measurements: 11.1 x 5.3 x 5.9 cm = volume: 181 mL. Echogenicity within normal limits. No mass or hydronephrosis visualized. Bladder: Appears normal for degree of bladder distention. IMPRESSION: Normal bilateral renal ultrasound. Electronically Signed   By: Kathreen Devoid   On: 06/23/2019 11:46   US Venous Img Lower Bilateral  Result Date: 06/22/2019 CLINICAL DATA:  Bilateral lower extremity pain and edema. Evaluate for DVT. EXAM: BILATERAL LOWER EXTREMITY VENOUS DOPPLER ULTRASOUND TECHNIQUE: Gray-scale sonography with graded compression, as well as color Doppler and duplex ultrasound were performed to evaluate the lower extremity deep venous systems from the level of the common femoral vein and including the common femoral, femoral, profunda femoral, popliteal and calf veins including the posterior tibial, peroneal and gastrocnemius veins when visible. The superficial great saphenous vein was also interrogated. Spectral Doppler was utilized to evaluate flow at rest and with distal augmentation maneuvers in the common femoral, femoral and popliteal veins. COMPARISON:  None. FINDINGS: RIGHT  LOWER EXTREMITY Common Femoral Vein: No evidence of thrombus. Normal compressibility, respiratory phasicity and response to augmentation. Saphenofemoral Junction: No evidence of thrombus. Normal compressibility and flow on color Doppler imaging. Profunda Femoral Vein: No evidence of thrombus. Normal compressibility and flow on color Doppler imaging. Femoral Vein: No evidence of thrombus. Normal compressibility, respiratory phasicity and response to augmentation. Popliteal Vein: No evidence of thrombus. Normal compressibility, respiratory phasicity and response to augmentation. Calf Veins: No evidence of thrombus. Normal compressibility and flow on color Doppler imaging. Superficial Great Saphenous Vein: No evidence of thrombus. Normal compressibility. Venous Reflux:  None. Other Findings:  None. LEFT LOWER EXTREMITY Common Femoral Vein: No evidence of thrombus. Normal compressibility, respiratory phasicity and response to augmentation. Saphenofemoral Junction: No evidence of thrombus. Normal compressibility and flow on color  Doppler imaging. Profunda Femoral Vein: No evidence of thrombus. Normal compressibility and flow on color Doppler imaging. Femoral Vein: No evidence of thrombus. Normal compressibility, respiratory phasicity and response to augmentation. Popliteal Vein: No evidence of thrombus. Normal compressibility, respiratory phasicity and response to augmentation. Calf Veins: No evidence of thrombus. Normal compressibility and flow on color Doppler imaging. Superficial Great Saphenous Vein: No evidence of thrombus. Normal compressibility. Venous Reflux:  None. Other Findings:  None. IMPRESSION: No evidence of DVT within either lower extremity. Electronically Signed   By: Sandi Mariscal M.D.   On: 06/22/2019 14:52   Dg Chest Port 1 View  Result Date: 06/22/2019 CLINICAL DATA:  Pt c/o swelling in lower extremities x several days. Hx CHF, obesity, diabetes, nonsmoker EXAM: PORTABLE CHEST 1 VIEW COMPARISON:   Chest radiographs dated 04/28/2019, 02/23/2019 FINDINGS: Stable cardiomediastinal contours with enlarged heart size. Central vascular congestion without overt edema. No pneumothorax or pleural effusion. Visualized skeleton is unremarkable. IMPRESSION: Cardiomegaly and central vascular congestion without overt edema. Electronically Signed   By: Audie Pinto M.D.   On: 06/22/2019 10:53   Vas US Renal Artery Duplex  Result Date: 06/16/2019 ABDOMINAL VISCERAL High Risk Factors: Hypertension, hyperlipidemia, Diabetes, no history of                    smoking. Other Factors: Limited study secondary to obesity and bowel gas. Limitations: Air/bowel gas and obesity. Performing Technologist: Delorise Shiner RVT  Examination Guidelines: A complete evaluation includes B-mode imaging, spectral Doppler, color Doppler, and power Doppler as needed of all accessible portions of each vessel. Bilateral testing is considered an integral part of a complete examination. Limited examinations for reoccurring indications may be performed as noted.  Duplex Findings: +----------+--------+--------+------+--------+  Mesenteric PSV cm/s EDV cm/s Plaque Comments  +----------+--------+--------+------+--------+  Aorta Mid     58                              +----------+--------+--------+------+--------+  +------------------+--------+--------+-------+  Right Renal Artery PSV cm/s EDV cm/s Comment  +------------------+--------+--------+-------+  Proximal              25                      +------------------+--------+--------+-------+  Mid                   26                      +------------------+--------+--------+-------+  Distal                27                      +------------------+--------+--------+-------+ +-----------------+--------+--------+-------+  Left Renal Artery PSV cm/s EDV cm/s Comment  +-----------------+--------+--------+-------+  Origin               40                       +-----------------+--------+--------+-------+  Proximal             29                      +-----------------+--------+--------+-------+  Mid                  27                      +-----------------+--------+--------+-------+  Distal               23                      +-----------------+--------+--------+-------+ +------------+--------+--------+--+-----------+--------+--------+---+  Right Kidney PSV cm/s EDV cm/s RI Left Kidney PSV cm/s EDV cm/s RI   +------------+--------+--------+--+-----------+--------+--------+---+  Upper Pole                        Upper Pole                         +------------+--------+--------+--+-----------+--------+--------+---+  Mid                               Mid                                +------------+--------+--------+--+-----------+--------+--------+---+  Lower Pole                        Lower Pole                         +------------+--------+--------+--+-----------+--------+--------+---+  Hilar                             Hilar                              +------------+--------+--------+--+-----------+--------+--------+---+ +------------------+-------+------------------+-------+  Right Kidney               Left Kidney                 +------------------+-------+------------------+-------+  RAR                        RAR                         +------------------+-------+------------------+-------+  RAR (manual)               RAR (manual)                +------------------+-------+------------------+-------+  Cortex                     Cortex                      +------------------+-------+------------------+-------+  Cortex thickness   1.88 mm Corex thickness    1.84 mm  +------------------+-------+------------------+-------+  Kidney length (cm) 13.27   Kidney length (cm) 10.24    +------------------+-------+------------------+-------+  Summary: Renal:  Right: Normal size right kidney. No evidence of right renal artery        stenosis. RRV flow present. Limited  study secondary to body        habitus. Could not insonnate right renal artery origin. Left:  Normal size of left kidney. No evidence of left renal artery        stenosis. LRV flow present. Limited study secondary to body        habitus.  *See table(s) above for measurements and observations.  Diagnosing physician: Ruta Hinds MD  Electronically signed by Ruta Hinds MD on 06/16/2019 at 10:43:24 AM.  Final     Orson Eva, DO  [facility] Pager [phone]  If 7PM-7AM, please contact night-coverage [email or web address] Password [email or web address] 06/25/2019, 5:45 PM   LOS: 3 days

## 2019-06-25 NOTE — Progress Notes (Signed)
[facility] Progress Note    Assessment/ Plan:   1.  AKI/CKD in setting of decompensated diastolic CHF and escalating diuretic dosage.  BL creatinine appears to be in the 3.6-4 range with e/o continued progression since mid 2019. No evidence of uremia.     - Renal u/s neg for hydro.  - Strict I&O's with floor weights please. I&O not reliable at this point without a foley but pt states that he is urinating a lot.  - Weights not declining.  - Follows with Befekadu, hopefully he can stabilize and then f/u with him to discuss HD options  2. Anasarca/acute on chronic diastolic CHF-  Nephrotic. Remains hypervolemic, cont TID lasix but if weights not declining should inc dose. 3. Malignant HTN- poorly controlled with unrevealing secondary HTN workup but pending MRA of renal arteries.  He was seen by [name] who ordered test, will need to see if Radiology can do a non-contrasted MRA of renal arteries with good image quality.   1. Cont diuresis 4. CKD stage 4- has been progressive due to poorly controlled HTN and DM.  5. DM- poorly controlled with Hgb A1c of 12.4%.  Changes to regimen per primary svc. 6. Hypokalemia- follow 7. Anemia of CKD- stable 8. Obesity- consider dietary consult to help with DM control and weight loss 9. Met Acidosis on NaHCO3, given hypervolemia stop aklali therapy  Subjective:   No new issues SCr 5.1 to 5.3, K and HCO3 ok 1.4L UOP He thinks LEE is improving Lasix 80 IV TID    Objective:   BP (!) 139/91 (BP Location: Left Arm)   Pulse 74   Temp 97.8 F (36.6 C)   Resp 16   Ht 5' 7"  (1.702 m)   Wt (!) 143 kg   SpO2 100%   BMI 49.38 kg/m   Intake/Output Summary (Last 24 hours) at 06/25/2019 1159 Last data filed at 06/25/2019 0900 Gross per 24 hour  Intake -  Output 1800 ml  Net -1800 ml   Weight change: 1.2 kg  Physical Exam: General appearance: NAD, moderately obese Head: NCAT Eyes: no conjunctival pallor Resp: CTA b/l Cardio:  regular rate and rhythm GI: SNDNT Extremities: edema 2+ edema  Imaging: No results found.  Labs: BMET Recent Labs  Lab 06/22/19 1052 06/23/19 0625 06/23/19 1540 06/24/19 0726 06/25/19 0618  NA 137 140  --  138 137  K 3.0* 3.4*  --  3.1* 3.6  CL 101 103  --  101 99  CO2 25 24  --  26 26  GLUCOSE 224* 273* 354* 226* 211*  BUN 61* 55*  --  55* 54*  CREATININE 4.87* 4.93*  --  5.14* 5.27*  CALCIUM 7.1* 7.6*  --  7.4* 7.7*  PHOS  --  5.6*  --  6.1* 6.7*   CBC Recent Labs  Lab 06/22/19 1052 06/23/19 0625  WBC 7.2 6.1  NEUTROABS 2.4  --   HGB 11.0* 11.6*  HCT 35.0* 37.3*  MCV 76.6* 78.2*  PLT 290 295    Medications:    . amLODipine  10 mg Oral Daily  . atorvastatin  80 mg Oral Daily  . carvedilol  12.5 mg Oral BID WC  . cloNIDine  0.3 mg Oral TID  . ezetimibe  10 mg Oral QPM  . furosemide  80 mg Intravenous TID  . gabapentin  100 mg Oral TID  . heparin  5,000 Units Subcutaneous Q8H  . insulin aspart  0-20 Units Subcutaneous TID  WC  . insulin aspart  0-5 Units Subcutaneous QHS  . insulin aspart  4 Units Subcutaneous TID WC  . insulin detemir  28 Units Subcutaneous BID  . linaclotide  145 mcg Oral QAC breakfast  . potassium chloride SA  20 mEq Oral Daily  . sodium bicarbonate  650 mg Oral BID      Pearson Grippe, MD 06/25/2019, 11:59 AM

## 2019-06-26 LAB — RENAL FUNCTION PANEL
Albumin: 3 g/dL — ABNORMAL LOW (ref 3.5–5.0)
Anion gap: 11 (ref 5–15)
BUN: 55 mg/dL — ABNORMAL HIGH (ref 6–20)
CO2: 27 mmol/L (ref 22–32)
Calcium: 7.9 mg/dL — ABNORMAL LOW (ref 8.9–10.3)
Chloride: 102 mmol/L (ref 98–111)
Creatinine, Ser: 5.23 mg/dL — ABNORMAL HIGH (ref 0.61–1.24)
GFR calc Af Amer: 14 mL/min — ABNORMAL LOW (ref >60)
GFR calc non Af Amer: 12 mL/min — ABNORMAL LOW (ref >60)
Glucose, Bld: 142 mg/dL — ABNORMAL HIGH (ref 70–99)
Phosphorus: 6.6 mg/dL — ABNORMAL HIGH (ref 2.5–4.6)
Potassium: 3.5 mmol/L (ref 3.5–5.1)
Sodium: 140 mmol/L (ref 135–145)

## 2019-06-26 LAB — GLUCOSE, CAPILLARY
Glucose-Capillary: 168 mg/dL — ABNORMAL HIGH (ref 70–99)
Glucose-Capillary: 226 mg/dL — ABNORMAL HIGH (ref 70–99)
Glucose-Capillary: 122 mg/dL — ABNORMAL HIGH (ref 70–99)
Glucose-Capillary: 267 mg/dL — ABNORMAL HIGH (ref 70–99)

## 2019-06-26 MED ORDER — POLYETHYLENE GLYCOL 3350 17 G PO PACK
17.0000 g | PACK | Freq: Every day | ORAL | Status: DC
  Administered 2019-06-26 – 2019-06-27 (×2): 17 g via ORAL
  Filled 2019-06-26 (×2): qty 1

## 2019-06-26 MED ORDER — SENNA 8.6 MG PO TABS
2.0000 | ORAL_TABLET | Freq: Every day | ORAL | Status: DC
  Administered 2019-06-26 – 2019-06-27 (×2): 17.2 mg via ORAL
  Filled 2019-06-26 (×2): qty 2

## 2019-06-26 NOTE — Progress Notes (Signed)
PROGRESS NOTE  GILFORD HOCKERSMITH [id number] DOB: 1971/01/20 DOA: 06/22/2019 PCP: [name], MD  Brief History: 48 y.o.malewith medical history ofhypertension, CKD stage IV, diastolic CHF, dyslipidemia, and gouty arthritis presenting with 2 to 3-week history of increasing lower extremity edema and increasing abdominal girth. He states that his symptoms have worsened in the past 2 to 3 days. He denies any fevers, chills, chest pain, shortness breath, cough, hemoptysis, nausea, vomiting, diarrhea, abdominal pain, dysuria, hematuria. He endorses compliance with his torsemide which he takes 60 mg in the a.m. and 40 mg in the p.m. He states that he has been on this dose for the last 2 months. He denies drinking any excess fluid or other dietary indiscretions. Because of his increasing leg heaviness, the patient states that he has been having difficulty getting around with leg weakness. He denies any back pain or recent falls. He denies any NSAID use. The patient recently had a renal artery ultrasound duplex which was negative for renal artery stenosis. He was supposed to be scheduled for an MRI of his renal arteries in the future by Dr. Juanda Crumble fields. In the emergency department, the patient was afebrile hemodynamically stable saturating 100% on room air. BMP showed a potassium of 3.0 and serum creatinine of 4.87. LFTs were unremarkable. WBC was 7.2. Chest x-ray showed cardiomegaly with some vascular congestion.He was admitted for management of his fluid overload and anasarca.  Assessment/Plan: Anasarca/Fluid overload/acute on chronic renal failure--CKD stage IV/Acute diastolic CHF -Suspect the patient likely has continued progression of his underlying kidney disease -Patient likely has underlying nephrotic syndrome and FSGS -Urine protein creatinine ratio--6.2 grams in June 2019 -The patient had been adamant about not pursuing dialysis, but may be having a change of heart at  this point -Nephrology consultappreciated -Previous baseline creatinine 3.6-3.9 in April 2020 -Presented with serum creatinine 4.7 -renal US--neg for hydronephrosis -Lasix IV 80 mg twice daily-->increase to tid -Echo--EF 50-55%, (EF 60-65% on 03/27/18)normal RV, trivial TR, aortic arch 40 mm -daily weights--down only 2.4 lbs  -I/Os no accurate without foley but has 2440 cc out in past 24h   Uncontrolled diabetes mellitus type 2 with hyperglycemia -06/23/19-Hemoglobin A1c--12.1 -NovoLog sliding scale--increase to resistant scale -ContinueLevemir-->increase to 30 units bid -increase novolog 6 units with meals  Essential hypertension, uncontrolled -Continue amlodipine, clonidine, labetalol -increased labetalol to 300 mg tid>>>transition to coreg  Hyperlipidemia -Continue statin  Leg edema/pain -venous duplex--neg  Hypokalemia -repleted  Hyperuricemia -continue allopurinol       Disposition Plan: Home when cleared by renal  Family Communication:NoFamily at bedside  Consultants:renal  Code Status: FULL   DVT Prophylaxis: [place] Heparin    Procedures: As Listed in Progress Note Above  Antibiotics: None     Subjective: Pt feels edema is slowly improving in legs.  Pain in legs improving.  Patient denies fevers, chills, headache, chest pain, dyspnea, nausea, vomiting, diarrhea, abdominal pain, dysuria, hematuria, hematochezia, and melena.   Objective: Vitals:   06/26/19 0517 06/26/19 0831 06/26/19 1053 06/26/19 1409  BP: (!) 144/102 (!) 155/98 (!) 147/97 129/81  Pulse: 73 75  73  Resp: 19   18  Temp: (!) 97.3 F (36.3 C)   98.4 F (36.9 C)  TempSrc: Oral     SpO2: 100%   95%  Weight: (!) 141.9 kg     Height:        Intake/Output Summary (Last 24 hours) at 06/26/2019 1522 Last data filed at 06/26/2019  1300 Gross per 24 hour  Intake 960 ml  Output 2050 ml  Net -1090 ml   Weight change: -1.1 kg Exam:   General:  Pt is  alert, follows commands appropriately, not in acute distress  HEENT: No icterus, No thrush, No neck mass, DISH/AT  Cardiovascular: RRR, S1/S2, no rubs, no gallops  Respiratory: fine bibasilar crackles. No wheeze  Abdomen: Soft/+BS, non tender, non distended, no guarding  Extremities: 1+LE edema, No lymphangitis, No petechiae, No rashes, no synovitis   Data Reviewed: I have personally reviewed following labs and imaging studies Basic Metabolic Panel: Recent Labs  Lab 06/22/19 1052 06/23/19 0625 06/23/19 1540 06/24/19 0726 06/25/19 0618 06/26/19 0618  NA 137 140  --  138 137 140  K 3.0* 3.4*  --  3.1* 3.6 3.5  CL 101 103  --  101 99 102  CO2 25 24  --  26 26 27   GLUCOSE 224* 273* 354* 226* 211* 142*  BUN 61* 55*  --  55* 54* 55*  CREATININE 4.87* 4.93*  --  5.14* 5.27* 5.23*  CALCIUM 7.1* 7.6*  --  7.4* 7.7* 7.9*  PHOS  --  5.6*  --  6.1* 6.7* 6.6*   Liver Function Tests: Recent Labs  Lab 06/22/19 1052 06/23/19 0625 06/24/19 0726 06/25/19 0618 06/26/19 0618  AST 21  --   --   --   --   ALT 21  --   --   --   --   ALKPHOS 107  --   --   --   --   BILITOT 0.3  --   --   --   --   PROT 6.9  --   --   --   --   ALBUMIN 3.1* 3.1* 2.8* 3.0* 3.0*   No results for input(s): LIPASE, AMYLASE in the last 168 hours. No results for input(s): AMMONIA in the last 168 hours. Coagulation Profile: No results for input(s): INR, PROTIME in the last 168 hours. CBC: Recent Labs  Lab 06/22/19 1052 06/23/19 0625  WBC 7.2 6.1  NEUTROABS 2.4  --   HGB 11.0* 11.6*  HCT 35.0* 37.3*  MCV 76.6* 78.2*  PLT 290 295   Cardiac Enzymes: No results for input(s): CKTOTAL, CKMB, CKMBINDEX, TROPONINI in the last 168 hours. BNP: Invalid input(s): POCBNP CBG: Recent Labs  Lab 06/25/19 1617 06/25/19 2155 06/25/19 2325 06/26/19 0740 06/26/19 1127  GLUCAP 117* 204* 171* 168* 226*   HbA1C: No results for input(s): HGBA1C in the last 72 hours. Urine analysis:    Component Value  Date/Time   COLORURINE STRAW (A) 06/23/2019 0500   APPEARANCEUR CLEAR 06/23/2019 0500   LABSPEC 1.010 06/23/2019 0500   PHURINE 6.0 06/23/2019 0500   GLUCOSEU >=500 (A) 06/23/2019 0500   HGBUR SMALL (A) 06/23/2019 0500   BILIRUBINUR NEGATIVE 06/23/2019 0500   KETONESUR NEGATIVE 06/23/2019 0500   PROTEINUR >=300 (A) 06/23/2019 0500   UROBILINOGEN 0.2 06/11/2012 0518   NITRITE NEGATIVE 06/23/2019 0500   LEUKOCYTESUR NEGATIVE 06/23/2019 0500   Sepsis Labs: @LABRCNTIP (procalcitonin:4,lacticidven:4) ) Recent Results (from the past 240 hour(s))  SARS CORONAVIRUS 2 (Masami Plata 6-12 HRS) Nasal Swab Aptima Multi Swab     Status: None   Collection Time: 06/22/19 12:30 PM   Specimen: Aptima Multi Swab; Nasal Swab  Result Value Ref Range Status   SARS Coronavirus 2 NEGATIVE NEGATIVE Final    Comment: (NOTE) SARS-CoV-2 target nucleic acids are NOT DETECTED. The SARS-CoV-2 RNA is generally detectable in upper  and lower respiratory specimens during the acute phase of infection. Negative results do not preclude SARS-CoV-2 infection, do not rule out co-infections with other pathogens, and should not be used as the sole basis for treatment or other patient management decisions. Negative results must be combined with clinical observations, patient history, and epidemiological information. The expected result is Negative. Fact Sheet for Patients: SugarRoll.be Fact Sheet for Healthcare Providers: https://www.woods-mathews.com/ This test is not yet approved or cleared by the [place] FDA and  has been authorized for detection and/or diagnosis of SARS-CoV-2 by FDA under an Emergency Use Authorization (EUA). This EUA will remain  in effect (meaning this test can be used) for the duration of the COVID-19 declaration under Section 56 4(b)(1) of the Act, 21 U.S.C. section 360bbb-3(b)(1), unless the authorization is terminated or revoked sooner. Performed at  [facility], [place] [address] [place], [place] [address]      Scheduled Meds:  amLODipine  10 mg Oral Daily   atorvastatin  80 mg Oral Daily   carvedilol  12.5 mg Oral BID WC   cloNIDine  0.3 mg Oral TID   ezetimibe  10 mg Oral QPM   furosemide  80 mg Intravenous TID   gabapentin  100 mg Oral TID   heparin  5,000 Units Subcutaneous Q8H   insulin aspart  0-20 Units Subcutaneous TID WC   insulin aspart  0-5 Units Subcutaneous QHS   insulin aspart  4 Units Subcutaneous TID WC   insulin detemir  30 Units Subcutaneous BID   linaclotide  145 mcg Oral QAC breakfast   polyethylene glycol  17 g Oral Daily   potassium chloride SA  20 mEq Oral Daily   senna  2 tablet Oral Daily   Continuous Infusions:  Procedures/Studies: US Renal  Result Date: 06/23/2019 CLINICAL DATA:  Acute renal failure EXAM: RENAL / URINARY TRACT ULTRASOUND COMPLETE COMPARISON:  None. FINDINGS: Right Kidney: Renal measurements: 11.7 x 4.8 x 6.1 cm = volume: 177 mL . Echogenicity within normal limits. No mass or hydronephrosis visualized. Left Kidney: Renal measurements: 11.1 x 5.3 x 5.9 cm = volume: 181 mL. Echogenicity within normal limits. No mass or hydronephrosis visualized. Bladder: Appears normal for degree of bladder distention. IMPRESSION: Normal bilateral renal ultrasound. Electronically Signed   By: Kathreen Devoid   On: 06/23/2019 11:46   US Venous Img Lower Bilateral  Result Date: 06/22/2019 CLINICAL DATA:  Bilateral lower extremity pain and edema. Evaluate for DVT. EXAM: BILATERAL LOWER EXTREMITY VENOUS DOPPLER ULTRASOUND TECHNIQUE: Gray-scale sonography with graded compression, as well as color Doppler and duplex ultrasound were performed to evaluate the lower extremity deep venous systems from the level of the common femoral vein and including the common femoral, femoral, profunda femoral, popliteal and calf veins including the posterior tibial, peroneal and gastrocnemius veins when  visible. The superficial great saphenous vein was also interrogated. Spectral Doppler was utilized to evaluate flow at rest and with distal augmentation maneuvers in the common femoral, femoral and popliteal veins. COMPARISON:  None. FINDINGS: RIGHT LOWER EXTREMITY Common Femoral Vein: No evidence of thrombus. Normal compressibility, respiratory phasicity and response to augmentation. Saphenofemoral Junction: No evidence of thrombus. Normal compressibility and flow on color Doppler imaging. Profunda Femoral Vein: No evidence of thrombus. Normal compressibility and flow on color Doppler imaging. Femoral Vein: No evidence of thrombus. Normal compressibility, respiratory phasicity and response to augmentation. Popliteal Vein: No evidence of thrombus. Normal compressibility, respiratory phasicity and response to augmentation. Calf Veins: No evidence  of thrombus. Normal compressibility and flow on color Doppler imaging. Superficial Great Saphenous Vein: No evidence of thrombus. Normal compressibility. Venous Reflux:  None. Other Findings:  None. LEFT LOWER EXTREMITY Common Femoral Vein: No evidence of thrombus. Normal compressibility, respiratory phasicity and response to augmentation. Saphenofemoral Junction: No evidence of thrombus. Normal compressibility and flow on color Doppler imaging. Profunda Femoral Vein: No evidence of thrombus. Normal compressibility and flow on color Doppler imaging. Femoral Vein: No evidence of thrombus. Normal compressibility, respiratory phasicity and response to augmentation. Popliteal Vein: No evidence of thrombus. Normal compressibility, respiratory phasicity and response to augmentation. Calf Veins: No evidence of thrombus. Normal compressibility and flow on color Doppler imaging. Superficial Great Saphenous Vein: No evidence of thrombus. Normal compressibility. Venous Reflux:  None. Other Findings:  None. IMPRESSION: No evidence of DVT within either lower extremity. Electronically  Signed   By: Sandi Mariscal M.D.   On: 06/22/2019 14:52   Dg Chest Port 1 View  Result Date: 06/22/2019 CLINICAL DATA:  Pt c/o swelling in lower extremities x several days. Hx CHF, obesity, diabetes, nonsmoker EXAM: PORTABLE CHEST 1 VIEW COMPARISON:  Chest radiographs dated 04/28/2019, 02/23/2019 FINDINGS: Stable cardiomediastinal contours with enlarged heart size. Central vascular congestion without overt edema. No pneumothorax or pleural effusion. Visualized skeleton is unremarkable. IMPRESSION: Cardiomegaly and central vascular congestion without overt edema. Electronically Signed   By: Audie Pinto M.D.   On: 06/22/2019 10:53   Vas US Renal Artery Duplex  Result Date: 06/16/2019 ABDOMINAL VISCERAL High Risk Factors: Hypertension, hyperlipidemia, Diabetes, no history of                    smoking. Other Factors: Limited study secondary to obesity and bowel gas. Limitations: Air/bowel gas and obesity. Performing Technologist: Delorise Shiner RVT  Examination Guidelines: A complete evaluation includes B-mode imaging, spectral Doppler, color Doppler, and power Doppler as needed of all accessible portions of each vessel. Bilateral testing is considered an integral part of a complete examination. Limited examinations for reoccurring indications may be performed as noted.  Duplex Findings: +----------+--------+--------+------+--------+  Mesenteric PSV cm/s EDV cm/s Plaque Comments  +----------+--------+--------+------+--------+  Aorta Mid     58                              +----------+--------+--------+------+--------+  +------------------+--------+--------+-------+  Right Renal Artery PSV cm/s EDV cm/s Comment  +------------------+--------+--------+-------+  Proximal              25                      +------------------+--------+--------+-------+  Mid                   26                      +------------------+--------+--------+-------+  Distal                27                       +------------------+--------+--------+-------+ +-----------------+--------+--------+-------+  Left Renal Artery PSV cm/s EDV cm/s Comment  +-----------------+--------+--------+-------+  Origin               40                      +-----------------+--------+--------+-------+  Proximal  29                      +-----------------+--------+--------+-------+  Mid                  27                      +-----------------+--------+--------+-------+  Distal               23                      +-----------------+--------+--------+-------+ +------------+--------+--------+--+-----------+--------+--------+---+  Right Kidney PSV cm/s EDV cm/s RI Left Kidney PSV cm/s EDV cm/s RI   +------------+--------+--------+--+-----------+--------+--------+---+  Upper Pole                        Upper Pole                         +------------+--------+--------+--+-----------+--------+--------+---+  Mid                               Mid                                +------------+--------+--------+--+-----------+--------+--------+---+  Lower Pole                        Lower Pole                         +------------+--------+--------+--+-----------+--------+--------+---+  Hilar                             Hilar                              +------------+--------+--------+--+-----------+--------+--------+---+ +------------------+-------+------------------+-------+  Right Kidney               Left Kidney                 +------------------+-------+------------------+-------+  RAR                        RAR                         +------------------+-------+------------------+-------+  RAR (manual)               RAR (manual)                +------------------+-------+------------------+-------+  Cortex                     Cortex                      +------------------+-------+------------------+-------+  Cortex thickness   1.88 mm Corex thickness    1.84 mm  +------------------+-------+------------------+-------+  Kidney  length (cm) 13.27   Kidney length (cm) 10.24    +------------------+-------+------------------+-------+  Summary: Renal:  Right: Normal size right kidney. No evidence of right renal artery        stenosis. RRV flow present. Limited study secondary to body        habitus. Could not insonnate right renal artery  origin. Left:  Normal size of left kidney. No evidence of left renal artery        stenosis. LRV flow present. Limited study secondary to body        habitus.  *See table(s) above for measurements and observations.  Diagnosing physician: Ruta Hinds MD  Electronically signed by Ruta Hinds MD on 06/16/2019 at 10:43:24 AM.    Final     Orson Eva, DO  [facility] Pager [phone]  If 7PM-7AM, please contact night-coverage [email or web address] Password [email or web address] 06/26/2019, 3:22 PM   LOS: 4 days

## 2019-06-26 NOTE — Plan of Care (Signed)

## 2019-06-27 LAB — RENAL FUNCTION PANEL
Albumin: 3 g/dL — ABNORMAL LOW (ref 3.5–5.0)
Anion gap: 11 (ref 5–15)
BUN: 59 mg/dL — ABNORMAL HIGH (ref 6–20)
CO2: 25 mmol/L (ref 22–32)
Calcium: 7.9 mg/dL — ABNORMAL LOW (ref 8.9–10.3)
Chloride: 102 mmol/L (ref 98–111)
Creatinine, Ser: 5.18 mg/dL — ABNORMAL HIGH (ref 0.61–1.24)
GFR calc Af Amer: 14 mL/min — ABNORMAL LOW (ref >60)
GFR calc non Af Amer: 12 mL/min — ABNORMAL LOW (ref >60)
Glucose, Bld: 196 mg/dL — ABNORMAL HIGH (ref 70–99)
Phosphorus: 6.4 mg/dL — ABNORMAL HIGH (ref 2.5–4.6)
Potassium: 3.6 mmol/L (ref 3.5–5.1)
Sodium: 138 mmol/L (ref 135–145)

## 2019-06-27 LAB — GLUCOSE, CAPILLARY
Glucose-Capillary: 162 mg/dL — ABNORMAL HIGH (ref 70–99)
Glucose-Capillary: 185 mg/dL — ABNORMAL HIGH (ref 70–99)

## 2019-06-27 MED ORDER — TORSEMIDE 20 MG PO TABS: 60.0000 mg | ORAL_TABLET | Freq: Two times a day (BID) | ORAL | 1 refills | Status: DC

## 2019-06-27 MED ORDER — TORSEMIDE 20 MG PO TABS
60.0000 mg | ORAL_TABLET | Freq: Two times a day (BID) | ORAL | Status: DC
  Administered 2019-06-27: 60 mg via ORAL
  Filled 2019-06-27: qty 3

## 2019-06-27 MED ORDER — INSULIN DETEMIR 100 UNIT/ML ~~LOC~~ SOLN
35.0000 [IU] | Freq: Two times a day (BID) | SUBCUTANEOUS | Status: DC
  Filled 2019-06-27 (×2): qty 0.35

## 2019-06-27 MED ORDER — CARVEDILOL 12.5 MG PO TABS: 25.0000 mg | ORAL_TABLET | Freq: Two times a day (BID) | ORAL | Status: DC

## 2019-06-27 MED ORDER — CARVEDILOL 25 MG PO TABS: 25.0000 mg | ORAL_TABLET | Freq: Two times a day (BID) | ORAL | 1 refills | Status: DC

## 2019-06-27 MED ORDER — INSULIN DETEMIR 100 UNIT/ML ~~LOC~~ SOLN: 35.0000 [IU] | Freq: Two times a day (BID) | SUBCUTANEOUS | 1 refills | Status: DC

## 2019-06-27 NOTE — Care Management Important Message (Signed)
Important Message  Patient Details  Name: Kelly Key MRN: [id number] Date of Birth: 09/05/1971   Medicare Important Message Given:  Yes     [name] 06/27/2019, 2:22 PM

## 2019-06-27 NOTE — Progress Notes (Signed)
Nsg Discharge Note  Admit Date:  06/22/2019 Discharge date: 06/27/2019   Kelly Key to be D/C'd home per MD order.  AVS completed.  Copy for chart, and copy for patient signed, and dated. Patient/caregiver able to verbalize understanding.  Discharge Medication: Allergies as of 06/27/2019      Reactions   Hydralazine    Lisinopril Swelling      Medication List    STOP taking these medications   Colcrys 0.6 MG tablet Generic drug: colchicine   HYDROcodone-acetaminophen 5-325 MG tablet Commonly known as: NORCO/VICODIN   labetalol 200 MG tablet Commonly known as: NORMODYNE   potassium chloride 10 MEQ CR capsule Commonly known as: MICRO-K     TAKE these medications   albuterol 108 (90 Base) MCG/ACT inhaler Commonly known as: VENTOLIN HFA Inhale 2 puffs into the lungs every 4 (four) hours as needed.   amLODipine 10 MG tablet Commonly known as: NORVASC Take 10 mg by mouth daily.   atorvastatin 80 MG tablet Commonly known as: LIPITOR Take 1 tablet by mouth daily.   carvedilol 25 MG tablet Commonly known as: COREG Take 1 tablet (25 mg total) by mouth 2 (two) times daily with a meal.   cloNIDine 0.3 MG tablet Commonly known as: CATAPRES Take 1 tablet (0.3 mg total) by mouth 3 (three) times daily.   ergocalciferol 1.25 MG (50000 UT) capsule Commonly known as: VITAMIN D2 Take 50,000 Units by mouth once a week.   ezetimibe 10 MG tablet Commonly known as: ZETIA Take 10 mg by mouth every evening.   fluticasone 50 MCG/ACT nasal spray Commonly known as: FLONASE Place 1 spray into both nostrils daily.   gabapentin 100 MG capsule Commonly known as: NEURONTIN Take 1 capsule by mouth 3 (three) times daily.   insulin aspart 100 UNIT/ML injection Commonly known as: novoLOG Inject 5-15 Units into the skin 3 (three) times daily before meals.   insulin detemir 100 UNIT/ML injection Commonly known as: LEVEMIR Inject 0.35 mLs (35 Units total) into the skin 2 (two) times  daily. What changed:   how much to take  additional instructions   Insulin Pen Needle 31G X 5 MM Misc Use as directed.   linaclotide 145 MCG Caps capsule Commonly known as: LINZESS Take 1 capsule (145 mcg total) by mouth daily before breakfast.   potassium chloride SA 20 MEQ tablet Commonly known as: K-DUR Take 1 tablet (20 mEq total) by mouth daily.   sodium bicarbonate 650 MG tablet Take 650 mg by mouth 2 (two) times daily.   torsemide 20 MG tablet Commonly known as: DEMADEX Take 3 tablets (60 mg total) by mouth 2 (two) times daily. What changed:   how much to take  how to take this  when to take this  additional instructions       Discharge Assessment: Vitals:   06/27/19 0537 06/27/19 1012  BP: (!) 157/92 (!) 146/92  Pulse: 74 77  Resp: 18 18  Temp: 98 F (36.7 C) 98.4 F (36.9 C)  SpO2: 98% 100%   Skin clean, dry and intact without evidence of skin break down, no evidence of skin tears noted. IV catheter discontinued intact. Site without signs and symptoms of complications - no redness or edema noted at insertion site, patient denies c/o pain - only slight tenderness at site.  Dressing with slight pressure applied.  D/c Instructions-Education: Discharge instructions given to patient/family with verbalized understanding. D/c education completed with patient/family including follow up instructions, medication list, d/c activities  limitations if indicated, with other d/c instructions as indicated by MD - patient able to verbalize understanding, all questions fully answered. Patient instructed to return to ED, call 911, or call MD for any changes in condition.  Patient escorted via [place], and D/C home via private auto.  Venita Sheffield, RN 06/27/2019 1:38 PM

## 2019-06-27 NOTE — TOC Transition Note (Signed)
Transition of Care [facility]) - CM/SW Discharge Note   Patient Details  Name: Kelly Key MRN: [id number] Date of Birth: Jan 03, 1971  [facility] CM/SW Contact:  Alexiah Koroma, Chauncey Reading, RN Phone Number: 06/27/2019, 10:50 AM   Clinical Narrative:   DC home today. [facility] referral made for continuing education on CKD and needing dialysis. F/u appt scheduled.     Final next level of care: Home/Self Care Barriers to Discharge: Continued Medical Work up   Patient Goals and CMS Choice Patient states their goals for this hospitalization and ongoing recovery are:: rerturn home, not interested in dialysis       Discharge Plan and Services   Discharge Planning Services: CM Consult, Follow-up appt scheduled                    Social Determinants of Health (SDOH) Interventions     Readmission Risk Interventions Readmission Risk Prevention Plan 06/27/2019 06/23/2019 02/21/2019  Transportation Screening - Complete Complete  PCP or Specialist Appt within 3-5 Days Complete - Complete  HRI or [place] - Complete Not Complete  HRI or Home Care Consult comments - - not needed, independent, active with [facility]  Social Work Consult for [place] Planning/Counseling - Complete Complete  Palliative Care Screening - Not Applicable Not Applicable  Medication Review [employer]) - Complete Complete  Some recent data might be hidden

## 2019-06-27 NOTE — Progress Notes (Signed)
Patient ID: Kelly Key, male   DOB: November 01, 1970, 48 y.o.   MRN: [id number] S: Feels well, no complaints O:BP (!) 157/92 (BP Location: Left Arm)   Pulse 74   Temp 98 F (36.7 C) (Oral)   Resp 18   Ht 5\' 7"  (1.702 m)   Wt (!) 141.6 kg   SpO2 98%   BMI 48.88 kg/m   Intake/Output Summary (Last 24 hours) at 06/27/2019 0915 Last data filed at 06/27/2019 0600 Gross per 24 hour  Intake 600 ml  Output 2200 ml  Net -1600 ml   Intake/Output: I/O last 3 completed shifts: In: 1080 [P.O.:1080] Out: 4250 [Urine:4250]  Intake/Output this shift:  No intake/output data recorded. Weight change: -0.332 kg Gen: NAD CVS: no rub Resp: cta Abd: obese +BS, soft, NT Ext:1+ edema bilateral lower ext.   Recent Labs  Lab 06/22/19 1052 06/23/19 0625 06/23/19 1540 06/24/19 0726 06/25/19 0618 06/26/19 0618 06/27/19 0441  NA 137 140  --  138 137 140 138  K 3.0* 3.4*  --  3.1* 3.6 3.5 3.6  CL 101 103  --  101 99 102 102  CO2 25 24  --  26 26 27 25   GLUCOSE 224* 273* 354* 226* 211* 142* 196*  BUN 61* 55*  --  55* 54* 55* 59*  CREATININE 4.87* 4.93*  --  5.14* 5.27* 5.23* 5.18*  ALBUMIN 3.1* 3.1*  --  2.8* 3.0* 3.0* 3.0*  CALCIUM 7.1* 7.6*  --  7.4* 7.7* 7.9* 7.9*  PHOS  --  5.6*  --  6.1* 6.7* 6.6* 6.4*  AST 21  --   --   --   --   --   --   ALT 21  --   --   --   --   --   --    Liver Function Tests: Recent Labs  Lab 06/22/19 1052  06/25/19 0618 06/26/19 0618 06/27/19 0441  AST 21  --   --   --   --   ALT 21  --   --   --   --   ALKPHOS 107  --   --   --   --   BILITOT 0.3  --   --   --   --   PROT 6.9  --   --   --   --   ALBUMIN 3.1*   < > 3.0* 3.0* 3.0*   < > = values in this interval not displayed.   No results for input(s): LIPASE, AMYLASE in the last 168 hours. No results for input(s): AMMONIA in the last 168 hours. CBC: Recent Labs  Lab 06/22/19 1052 06/23/19 0625  WBC 7.2 6.1  NEUTROABS 2.4  --   HGB 11.0* 11.6*  HCT 35.0* 37.3*  MCV 76.6* 78.2*  PLT 290 295    Cardiac Enzymes: No results for input(s): CKTOTAL, CKMB, CKMBINDEX, TROPONINI in the last 168 hours. CBG: Recent Labs  Lab 06/26/19 0740 06/26/19 1127 06/26/19 1630 06/26/19 2148 06/27/19 0814  GLUCAP 168* 226* 122* 267* 162*    Iron Studies: No results for input(s): IRON, TIBC, TRANSFERRIN, FERRITIN in the last 72 hours. Studies/Results: No results found. [name] amLODipine  10 mg Oral Daily  . atorvastatin  80 mg Oral Daily  . carvedilol  12.5 mg Oral BID WC  . cloNIDine  0.3 mg Oral TID  . ezetimibe  10 mg Oral QPM  . furosemide  80 mg Intravenous TID  . gabapentin  100 mg Oral TID  . heparin  5,000 Units Subcutaneous Q8H  . insulin aspart  0-20 Units Subcutaneous TID WC  . insulin aspart  0-5 Units Subcutaneous QHS  . insulin aspart  4 Units Subcutaneous TID WC  . insulin detemir  30 Units Subcutaneous BID  . linaclotide  145 mcg Oral QAC breakfast  . polyethylene glycol  17 g Oral Daily  . potassium chloride SA  20 mEq Oral Daily  . senna  2 tablet Oral Daily    BMET    Component Value Date/Time   NA 138 06/27/2019 0441   K 3.6 06/27/2019 0441   CL 102 06/27/2019 0441   CO2 25 06/27/2019 0441   GLUCOSE 196 (H) 06/27/2019 0441   BUN 59 (H) 06/27/2019 0441   CREATININE 5.18 (H) 06/27/2019 0441   CALCIUM 7.9 (L) 06/27/2019 0441   GFRNONAA 12 (L) 06/27/2019 0441   GFRAA 14 (L) 06/27/2019 0441   CBC    Component Value Date/Time   WBC 6.1 06/23/2019 0625   RBC 4.77 06/23/2019 0625   HGB 11.6 (L) 06/23/2019 0625   HCT 37.3 (L) 06/23/2019 0625   PLT 295 06/23/2019 0625   MCV 78.2 (L) 06/23/2019 0625   MCH 24.3 (L) 06/23/2019 0625   MCHC 31.1 06/23/2019 0625   RDW 15.1 06/23/2019 0625   LYMPHSABS 3.1 06/22/2019 1052   MONOABS 0.9 06/22/2019 1052   EOSABS 0.7 (H) 06/22/2019 1052   BASOSABS 0.1 06/22/2019 1052    Assessment/Plan: 1.  AKI/CKD in setting of decompensated diastolic CHF and escalating diuretic dosage.  No evidence of uremia and pt resistant to  considering dialysis at this time.  Continue with bp control, diuresis, and follow. 1. Needs to f/u with Dr. Lowanda Foster after discharge for close follow up as he is now considering dialysis. 2. Anasarca/acute on chronic diastolic CHF- Started on IV lasix with marked improvement of UOP of over 600 ml this morning.  Agree with cardiology consult and ECHO for added information.  His urine prot/creat ratio is markedly elevated at 6.3 mg/g, however albumin is only mildly decreased at 3.1 (consistent with DN and obesity related FSGS).   1. Markedly improved with IV lasix 2. Would resume po torsemide but increase dose to 60 mg bid 3. Stable for discharge from renal standpoint.   3. Malignant HTN- poorly controlled with unrevealing secondary HTN workup but pending MRA of renal arteries.  He was seen by [name] who ordered test, will need to see if Radiology can do a non-contrasted MRA of renal arteries with good image quality.   1. MRA ordered however he is too heavy for machine here and discussed with [name] who will arrange for MRI in [facility] after discharge   2. Also should improved with diuresis. 4. CKD stage 4- has been progressive due to poorly controlled HTN and DM.  Not sure kidney biopsy would be helpful given chronicity and severity of CKD at this point.  I spent time discussing the severity of his CKD as well as stage.  We also discussed HD, home HD, PD, and renal transplantation.  Will have him watch educational video tapes regarding dialysis.  We also discussed the ways to delay the progression of CKD with  1. Tight BP control (goal BP <130/80) 2. Tight glucose control (goal Hgb A1c of <7%) 3. Weight loss (especially for BP, DM, and obesity related FSGS) 4. Avoidance of nephrotoxic agents such as NSAIDs, COX-II I's, IV contrast 5. Use of  ACE/ARB, however given ongoing aggressive diuresis, AKI/CKD, and his unwillingness to consider HD at this time, risks outweigh benefits. 6. Continue with  education and encouragement.  He will likely agree to dialysis if/when it is indicated.  5. DM- poorly controlled with Hgb A1c of 12.4%.  Changes to regimen per primary svc. 6. Hypokalemia- replete and follow 7. Anemia of CKD- stable 8. Obesity- consider dietary consult to help with DM control and weight loss 9. Hyperuricemia- consider allopurinol or uloric. 10. Disposition- stable for discharge from renal standpoint.   Donetta Potts, MD [employer] [phone]

## 2019-06-27 NOTE — Discharge Summary (Signed)
Physician Discharge Summary  Kelly Key [id number] DOB: [birth year] DOA: 06/22/2019  PCP: [name], MD  Admit date: 06/22/2019 Discharge date: 06/27/2019  Admitted From:  Home Disposition:  Home  Recommendations for Outpatient Follow-up:  1. Follow up with PCP in 1-2 weeks 2. Please obtain BMP/CBC in one week     Discharge Condition: Stable CODE STATUS: FULL Diet recommendation: Heart Healthy / Carb Modified   Brief/Interim Summary: 48 y.o.malewith medical history ofhypertension, CKD stage IV, diastolic CHF, dyslipidemia, and gouty arthritis presenting with 2 to 3-week history of increasing lower extremity edema and increasing abdominal girth. He states that his symptoms have worsened in the past 2 to 3 days. He denies any fevers, chills, chest pain, shortness breath, cough, hemoptysis, nausea, vomiting, diarrhea, abdominal pain, dysuria, hematuria. He endorses compliance with his torsemide which he takes 60 mg in the a.m. and 40 mg in the p.m. He states that he has been on this dose for the last 2 months. He denies drinking any excess fluid or other dietary indiscretions. Because of his increasing leg heaviness, the patient states that he has been having difficulty getting around with leg weakness. He denies any back pain or recent falls. He denies any NSAID use. The patient recently had a renal artery ultrasound duplex which was negative for renal artery stenosis. He was supposed to be scheduled for an MRI of his renal arteries in the future by Dr. Juanda Crumble fields. In the emergency department, the patient was afebrile hemodynamically stable saturating 100% on room air. BMP showed a potassium of 3.0 and serum creatinine of 4.87. LFTs were unremarkable. WBC was 7.2. Chest x-ray showed cardiomegaly with some vascular congestion.He was admitted for management of his fluid overload and anasarca.  He was started on IV lasix.  Nephrology was consulted to assist with  management.  Discharge Diagnoses:  Anasarca/Fluid overload/acute on chronic renal failure--CKD stage IV/Acute diastolic CHF -Suspect the patient likely has continued progression of his underlying kidney disease -Patient likely has underlying nephrotic syndrome and FSGS -Urine protein creatinine ratio--6.2 grams in June 2019 -The patient had been adamant about not pursuing dialysis, but may be having a change of heart at this point -Nephrology consultappreciated -Previous baseline creatinine 3.6-3.9 in April 2020 -Presented with serum creatinine 4.7 -new baseline 4.9-5.2 -renal US--neg for hydronephrosis -Lasix IV 80 mg twice daily-->increase to tid -Echo--EF 50-55%, (EF 60-65% on 03/27/18)normal RV, trivial TR, aortic arch 40 mm -daily weights--down only 3.1 lbs  -I/Os no accurate without foley but has 2440 cc out in past 24h  -discharge home with torsemide 60 mg bid -discharge weight 312.1 lbs  Uncontrolled diabetes mellitus type 2 with hyperglycemia -06/23/19-Hemoglobin A1c--12.1 -NovoLog sliding scale--increase to resistant scale -ContinueLevemir-->increase to35units bid -increase novolog 6 units with meals  Essential hypertension, uncontrolled -Continue amlodipine, clonidine, labetalol -increased labetalol to 300 mg tid>>>transition to coreg 25 mg bid  Hyperlipidemia -Continue statin  Leg edema/pain -venous duplex--neg  Hypokalemia -repleted  Hyperuricemia -continue allopurinol    Discharge Instructions   Allergies as of 06/27/2019      Reactions   Hydralazine    Lisinopril Swelling      Medication List    STOP taking these medications   Colcrys 0.6 MG tablet Generic drug: colchicine   HYDROcodone-acetaminophen 5-325 MG tablet Commonly known as: NORCO/VICODIN   labetalol 200 MG tablet Commonly known as: NORMODYNE   potassium chloride 10 MEQ CR capsule Commonly known as: MICRO-K     TAKE these medications   albuterol 108 (  90 Base)  MCG/ACT inhaler Commonly known as: VENTOLIN HFA Inhale 2 puffs into the lungs every 4 (four) hours as needed.   amLODipine 10 MG tablet Commonly known as: NORVASC Take 10 mg by mouth daily.   atorvastatin 80 MG tablet Commonly known as: LIPITOR Take 1 tablet by mouth daily.   carvedilol 25 MG tablet Commonly known as: COREG Take 1 tablet (25 mg total) by mouth 2 (two) times daily with a meal.   cloNIDine 0.3 MG tablet Commonly known as: CATAPRES Take 1 tablet (0.3 mg total) by mouth 3 (three) times daily.   ergocalciferol 1.25 MG (50000 UT) capsule Commonly known as: VITAMIN D2 Take 50,000 Units by mouth once a week.   ezetimibe 10 MG tablet Commonly known as: ZETIA Take 10 mg by mouth every evening.   fluticasone 50 MCG/ACT nasal spray Commonly known as: FLONASE Place 1 spray into both nostrils daily.   gabapentin 100 MG capsule Commonly known as: NEURONTIN Take 1 capsule by mouth 3 (three) times daily.   insulin aspart 100 UNIT/ML injection Commonly known as: novoLOG Inject 5-15 Units into the skin 3 (three) times daily before meals.   insulin detemir 100 UNIT/ML injection Commonly known as: LEVEMIR Inject 0.35 mLs (35 Units total) into the skin 2 (two) times daily. What changed:   how much to take  additional instructions   Insulin Pen Needle 31G X 5 MM Misc Use as directed.   linaclotide 145 MCG Caps capsule Commonly known as: LINZESS Take 1 capsule (145 mcg total) by mouth daily before breakfast.   potassium chloride SA 20 MEQ tablet Commonly known as: K-DUR Take 1 tablet (20 mEq total) by mouth daily.   sodium bicarbonate 650 MG tablet Take 650 mg by mouth 2 (two) times daily.   torsemide 20 MG tablet Commonly known as: DEMADEX Take 3 tablets (60 mg total) by mouth 2 (two) times daily. What changed:   how much to take  how to take this  when to take this  additional instructions       Allergies  Allergen Reactions  . Hydralazine    . Lisinopril Swelling    Consultations:  renal   Procedures/Studies: US Renal  Result Date: 06/23/2019 CLINICAL DATA:  Acute renal failure EXAM: RENAL / URINARY TRACT ULTRASOUND COMPLETE COMPARISON:  None. FINDINGS: Right Kidney: Renal measurements: 11.7 x 4.8 x 6.1 cm = volume: 177 mL . Echogenicity within normal limits. No mass or hydronephrosis visualized. Left Kidney: Renal measurements: 11.1 x 5.3 x 5.9 cm = volume: 181 mL. Echogenicity within normal limits. No mass or hydronephrosis visualized. Bladder: Appears normal for degree of bladder distention. IMPRESSION: Normal bilateral renal ultrasound. Electronically Signed   By: Kathreen Devoid   On: 06/23/2019 11:46   US Venous Img Lower Bilateral  Result Date: 06/22/2019 CLINICAL DATA:  Bilateral lower extremity pain and edema. Evaluate for DVT. EXAM: BILATERAL LOWER EXTREMITY VENOUS DOPPLER ULTRASOUND TECHNIQUE: Gray-scale sonography with graded compression, as well as color Doppler and duplex ultrasound were performed to evaluate the lower extremity deep venous systems from the level of the common femoral vein and including the common femoral, femoral, profunda femoral, popliteal and calf veins including the posterior tibial, peroneal and gastrocnemius veins when visible. The superficial great saphenous vein was also interrogated. Spectral Doppler was utilized to evaluate flow at rest and with distal augmentation maneuvers in the common femoral, femoral and popliteal veins. COMPARISON:  None. FINDINGS: RIGHT LOWER EXTREMITY Common Femoral Vein: No evidence of  thrombus. Normal compressibility, respiratory phasicity and response to augmentation. Saphenofemoral Junction: No evidence of thrombus. Normal compressibility and flow on color Doppler imaging. Profunda Femoral Vein: No evidence of thrombus. Normal compressibility and flow on color Doppler imaging. Femoral Vein: No evidence of thrombus. Normal compressibility, respiratory phasicity and  response to augmentation. Popliteal Vein: No evidence of thrombus. Normal compressibility, respiratory phasicity and response to augmentation. Calf Veins: No evidence of thrombus. Normal compressibility and flow on color Doppler imaging. Superficial Great Saphenous Vein: No evidence of thrombus. Normal compressibility. Venous Reflux:  None. Other Findings:  None. LEFT LOWER EXTREMITY Common Femoral Vein: No evidence of thrombus. Normal compressibility, respiratory phasicity and response to augmentation. Saphenofemoral Junction: No evidence of thrombus. Normal compressibility and flow on color Doppler imaging. Profunda Femoral Vein: No evidence of thrombus. Normal compressibility and flow on color Doppler imaging. Femoral Vein: No evidence of thrombus. Normal compressibility, respiratory phasicity and response to augmentation. Popliteal Vein: No evidence of thrombus. Normal compressibility, respiratory phasicity and response to augmentation. Calf Veins: No evidence of thrombus. Normal compressibility and flow on color Doppler imaging. Superficial Great Saphenous Vein: No evidence of thrombus. Normal compressibility. Venous Reflux:  None. Other Findings:  None. IMPRESSION: No evidence of DVT within either lower extremity. Electronically Signed   By: Sandi Mariscal M.D.   On: 06/22/2019 14:52   Dg Chest Port 1 View  Result Date: 06/22/2019 CLINICAL DATA:  Pt c/o swelling in lower extremities x several days. Hx CHF, obesity, diabetes, nonsmoker EXAM: PORTABLE CHEST 1 VIEW COMPARISON:  Chest radiographs dated 04/28/2019, 02/23/2019 FINDINGS: Stable cardiomediastinal contours with enlarged heart size. Central vascular congestion without overt edema. No pneumothorax or pleural effusion. Visualized skeleton is unremarkable. IMPRESSION: Cardiomegaly and central vascular congestion without overt edema. Electronically Signed   By: Audie Pinto M.D.   On: 06/22/2019 10:53   Vas US Renal Artery Duplex  Result Date:  06/16/2019 ABDOMINAL VISCERAL High Risk Factors: Hypertension, hyperlipidemia, Diabetes, no history of                    smoking. Other Factors: Limited study secondary to obesity and bowel gas. Limitations: Air/bowel gas and obesity. Performing Technologist: Delorise Shiner RVT  Examination Guidelines: A complete evaluation includes B-mode imaging, spectral Doppler, color Doppler, and power Doppler as needed of all accessible portions of each vessel. Bilateral testing is considered an integral part of a complete examination. Limited examinations for reoccurring indications may be performed as noted.  Duplex Findings: +----------+--------+--------+------+--------+ MesentericPSV cm/sEDV cm/sPlaqueComments +----------+--------+--------+------+--------+ Aorta Mid    58                          +----------+--------+--------+------+--------+  +------------------+--------+--------+-------+ Right Renal ArteryPSV cm/sEDV cm/sComment +------------------+--------+--------+-------+ Proximal             25                   +------------------+--------+--------+-------+ Mid                  26                   +------------------+--------+--------+-------+ Distal               27                   +------------------+--------+--------+-------+ +-----------------+--------+--------+-------+ Left Renal ArteryPSV cm/sEDV cm/sComment +-----------------+--------+--------+-------+ Origin              40                   +-----------------+--------+--------+-------+  Proximal            29                   +-----------------+--------+--------+-------+ Mid                 27                   +-----------------+--------+--------+-------+ Distal              23                   +-----------------+--------+--------+-------+ +------------+--------+--------+--+-----------+--------+--------+---+ Right KidneyPSV cm/sEDV cm/sRILeft KidneyPSV cm/sEDV cm/sRI   +------------+--------+--------+--+-----------+--------+--------+---+ Upper Pole                    Upper Pole                     +------------+--------+--------+--+-----------+--------+--------+---+ Mid                           Mid                            +------------+--------+--------+--+-----------+--------+--------+---+ Lower Pole                    Lower Pole                     +------------+--------+--------+--+-----------+--------+--------+---+ Hilar                         Hilar                          +------------+--------+--------+--+-----------+--------+--------+---+ +------------------+-------+------------------+-------+ Right Kidney             Left Kidney               +------------------+-------+------------------+-------+ RAR                      RAR                       +------------------+-------+------------------+-------+ RAR (manual)             RAR (manual)              +------------------+-------+------------------+-------+ Cortex                   Cortex                    +------------------+-------+------------------+-------+ Cortex thickness  1.88 mmCorex thickness   1.84 mm +------------------+-------+------------------+-------+ Kidney length (cm)13.27  Kidney length (cm)10.24   +------------------+-------+------------------+-------+  Summary: Renal:  Right: Normal size right kidney. No evidence of right renal artery        stenosis. RRV flow present. Limited study secondary to body        habitus. Could not insonnate right renal artery origin. Left:  Normal size of left kidney. No evidence of left renal artery        stenosis. LRV flow present. Limited study secondary to body        habitus.  *See table(s) above for measurements and observations.  Diagnosing physician: Ruta Hinds MD  Electronically signed by Ruta Hinds MD on 06/16/2019 at 10:43:24 AM.    Final  Discharge Exam: Vitals:    06/27/19 0537 06/27/19 1012  BP: (!) 157/92 (!) 146/92  Pulse: 74 77  Resp: 18 18  Temp: 98 F (36.7 C) 98.4 F (36.9 C)  SpO2: 98% 100%   Vitals:   06/26/19 2146 06/27/19 0537 06/27/19 0549 06/27/19 1012  BP: 130/69 (!) 157/92  (!) 146/92  Pulse: 74 74  77  Resp: 18 18  18   Temp: 98.2 F (36.8 C) 98 F (36.7 C)  98.4 F (36.9 C)  TempSrc: Oral Oral  Oral  SpO2: 100% 98%  100%  Weight:   (!) 141.6 kg   Height:        General: Pt is alert, awake, not in acute distress Cardiovascular: RRR, S1/S2 +, no rubs, no gallops Respiratory: CTA bilaterally, no wheezing, no rhonchi Abdominal: Soft, NT, ND, bowel sounds + Extremities: 2  + LE edema, no cyanosis   The results of significant diagnostics from this hospitalization (including imaging, microbiology, ancillary and laboratory) are listed below for reference.    Significant Diagnostic Studies: US Renal  Result Date: 06/23/2019 CLINICAL DATA:  Acute renal failure EXAM: RENAL / URINARY TRACT ULTRASOUND COMPLETE COMPARISON:  None. FINDINGS: Right Kidney: Renal measurements: 11.7 x 4.8 x 6.1 cm = volume: 177 mL . Echogenicity within normal limits. No mass or hydronephrosis visualized. Left Kidney: Renal measurements: 11.1 x 5.3 x 5.9 cm = volume: 181 mL. Echogenicity within normal limits. No mass or hydronephrosis visualized. Bladder: Appears normal for degree of bladder distention. IMPRESSION: Normal bilateral renal ultrasound. Electronically Signed   By: Kathreen Devoid   On: 06/23/2019 11:46   US Venous Img Lower Bilateral  Result Date: 06/22/2019 CLINICAL DATA:  Bilateral lower extremity pain and edema. Evaluate for DVT. EXAM: BILATERAL LOWER EXTREMITY VENOUS DOPPLER ULTRASOUND TECHNIQUE: Gray-scale sonography with graded compression, as well as color Doppler and duplex ultrasound were performed to evaluate the lower extremity deep venous systems from the level of the common femoral vein and including the common femoral, femoral,  profunda femoral, popliteal and calf veins including the posterior tibial, peroneal and gastrocnemius veins when visible. The superficial great saphenous vein was also interrogated. Spectral Doppler was utilized to evaluate flow at rest and with distal augmentation maneuvers in the common femoral, femoral and popliteal veins. COMPARISON:  None. FINDINGS: RIGHT LOWER EXTREMITY Common Femoral Vein: No evidence of thrombus. Normal compressibility, respiratory phasicity and response to augmentation. Saphenofemoral Junction: No evidence of thrombus. Normal compressibility and flow on color Doppler imaging. Profunda Femoral Vein: No evidence of thrombus. Normal compressibility and flow on color Doppler imaging. Femoral Vein: No evidence of thrombus. Normal compressibility, respiratory phasicity and response to augmentation. Popliteal Vein: No evidence of thrombus. Normal compressibility, respiratory phasicity and response to augmentation. Calf Veins: No evidence of thrombus. Normal compressibility and flow on color Doppler imaging. Superficial Great Saphenous Vein: No evidence of thrombus. Normal compressibility. Venous Reflux:  None. Other Findings:  None. LEFT LOWER EXTREMITY Common Femoral Vein: No evidence of thrombus. Normal compressibility, respiratory phasicity and response to augmentation. Saphenofemoral Junction: No evidence of thrombus. Normal compressibility and flow on color Doppler imaging. Profunda Femoral Vein: No evidence of thrombus. Normal compressibility and flow on color Doppler imaging. Femoral Vein: No evidence of thrombus. Normal compressibility, respiratory phasicity and response to augmentation. Popliteal Vein: No evidence of thrombus. Normal compressibility, respiratory phasicity and response to augmentation. Calf Veins: No evidence of thrombus. Normal compressibility and flow on color Doppler imaging. Superficial Great Saphenous Vein: No evidence of thrombus.  Normal compressibility. Venous  Reflux:  None. Other Findings:  None. IMPRESSION: No evidence of DVT within either lower extremity. Electronically Signed   By: Sandi Mariscal M.D.   On: 06/22/2019 14:52   Dg Chest Port 1 View  Result Date: 06/22/2019 CLINICAL DATA:  Pt c/o swelling in lower extremities x several days. Hx CHF, obesity, diabetes, nonsmoker EXAM: PORTABLE CHEST 1 VIEW COMPARISON:  Chest radiographs dated 04/28/2019, 02/23/2019 FINDINGS: Stable cardiomediastinal contours with enlarged heart size. Central vascular congestion without overt edema. No pneumothorax or pleural effusion. Visualized skeleton is unremarkable. IMPRESSION: Cardiomegaly and central vascular congestion without overt edema. Electronically Signed   By: Audie Pinto M.D.   On: 06/22/2019 10:53   Vas US Renal Artery Duplex  Result Date: 06/16/2019 ABDOMINAL VISCERAL High Risk Factors: Hypertension, hyperlipidemia, Diabetes, no history of                    smoking. Other Factors: Limited study secondary to obesity and bowel gas. Limitations: Air/bowel gas and obesity. Performing Technologist: Delorise Shiner RVT  Examination Guidelines: A complete evaluation includes B-mode imaging, spectral Doppler, color Doppler, and power Doppler as needed of all accessible portions of each vessel. Bilateral testing is considered an integral part of a complete examination. Limited examinations for reoccurring indications may be performed as noted.  Duplex Findings: +----------+--------+--------+------+--------+ MesentericPSV cm/sEDV cm/sPlaqueComments +----------+--------+--------+------+--------+ Aorta Mid    58                          +----------+--------+--------+------+--------+  +------------------+--------+--------+-------+ Right Renal ArteryPSV cm/sEDV cm/sComment +------------------+--------+--------+-------+ Proximal             25                   +------------------+--------+--------+-------+ Mid                  26                    +------------------+--------+--------+-------+ Distal               27                   +------------------+--------+--------+-------+ +-----------------+--------+--------+-------+ Left Renal ArteryPSV cm/sEDV cm/sComment +-----------------+--------+--------+-------+ Origin              40                   +-----------------+--------+--------+-------+ Proximal            29                   +-----------------+--------+--------+-------+ Mid                 27                   +-----------------+--------+--------+-------+ Distal              23                   +-----------------+--------+--------+-------+ +------------+--------+--------+--+-----------+--------+--------+---+ Right KidneyPSV cm/sEDV cm/sRILeft KidneyPSV cm/sEDV cm/sRI  +------------+--------+--------+--+-----------+--------+--------+---+ Upper Pole                    Upper Pole                     +------------+--------+--------+--+-----------+--------+--------+---+ Mid  Mid                            +------------+--------+--------+--+-----------+--------+--------+---+ Lower Pole                    Lower Pole                     +------------+--------+--------+--+-----------+--------+--------+---+ Hilar                         Hilar                          +------------+--------+--------+--+-----------+--------+--------+---+ +------------------+-------+------------------+-------+ Right Kidney             Left Kidney               +------------------+-------+------------------+-------+ RAR                      RAR                       +------------------+-------+------------------+-------+ RAR (manual)             RAR (manual)              +------------------+-------+------------------+-------+ Cortex                   Cortex                    +------------------+-------+------------------+-------+ Cortex thickness  1.88  mmCorex thickness   1.84 mm +------------------+-------+------------------+-------+ Kidney length (cm)13.27  Kidney length (cm)10.24   +------------------+-------+------------------+-------+  Summary: Renal:  Right: Normal size right kidney. No evidence of right renal artery        stenosis. RRV flow present. Limited study secondary to body        habitus. Could not insonnate right renal artery origin. Left:  Normal size of left kidney. No evidence of left renal artery        stenosis. LRV flow present. Limited study secondary to body        habitus.  *See table(s) above for measurements and observations.  Diagnosing physician: Ruta Hinds MD  Electronically signed by Ruta Hinds MD on 06/16/2019 at 10:43:24 AM.    Final      Microbiology: Recent Results (from the past 240 hour(s))  SARS CORONAVIRUS 2 (Nicolemarie Wooley 6-12 HRS) Nasal Swab Aptima Multi Swab     Status: None   Collection Time: 06/22/19 12:30 PM   Specimen: Aptima Multi Swab; Nasal Swab  Result Value Ref Range Status   SARS Coronavirus 2 NEGATIVE NEGATIVE Final    Comment: (NOTE) SARS-CoV-2 target nucleic acids are NOT DETECTED. The SARS-CoV-2 RNA is generally detectable in upper and lower respiratory specimens during the acute phase of infection. Negative results do not preclude SARS-CoV-2 infection, do not rule out co-infections with other pathogens, and should not be used as the sole basis for treatment or other patient management decisions. Negative results must be combined with clinical observations, patient history, and epidemiological information. The expected result is Negative. Fact Sheet for Patients: SugarRoll.be Fact Sheet for Healthcare Providers: https://www.woods-mathews.com/ This test is not yet approved or cleared by the [place] FDA and  has been authorized for detection and/or diagnosis of SARS-CoV-2 by FDA under an Emergency Use Authorization (EUA). This EUA  will remain  in effect (meaning this  test can be used) for the duration of the COVID-19 declaration under Section 56 4(b)(1) of the Act, 21 U.S.C. section 360bbb-3(b)(1), unless the authorization is terminated or revoked sooner. Performed at [facility], [place] [address] [place], [place] [address]      Labs: Basic Metabolic Panel: Recent Labs  Lab 06/23/19 [phone] 06/23/19 1540 06/24/19 0726 06/25/19 0618 06/26/19 0618 06/27/19 0441  NA 140  --  138 137 140 138  K 3.4*  --  3.1* 3.6 3.5 3.6  CL 103  --  101 99 102 102  CO2 24  --  26 26 27 25   GLUCOSE 273* 354* 226* 211* 142* 196*  BUN 55*  --  55* 54* 55* 59*  CREATININE 4.93*  --  5.14* 5.27* 5.23* 5.18*  CALCIUM 7.6*  --  7.4* 7.7* 7.9* 7.9*  PHOS 5.6*  --  6.1* 6.7* 6.6* 6.4*   Liver Function Tests: Recent Labs  Lab 06/22/19 1052 06/23/19 0625 06/24/19 0726 06/25/19 0618 06/26/19 0618 06/27/19 0441  AST 21  --   --   --   --   --   ALT 21  --   --   --   --   --   ALKPHOS 107  --   --   --   --   --   BILITOT 0.3  --   --   --   --   --   PROT 6.9  --   --   --   --   --   ALBUMIN 3.1* 3.1* 2.8* 3.0* 3.0* 3.0*   No results for input(s): LIPASE, AMYLASE in the last 168 hours. No results for input(s): AMMONIA in the last 168 hours. CBC: Recent Labs  Lab 06/22/19 1052 06/23/19 0625  WBC 7.2 6.1  NEUTROABS 2.4  --   HGB 11.0* 11.6*  HCT 35.0* 37.3*  MCV 76.6* 78.2*  PLT 290 295   Cardiac Enzymes: No results for input(s): CKTOTAL, CKMB, CKMBINDEX, TROPONINI in the last 168 hours. BNP: Invalid input(s): POCBNP CBG: Recent Labs  Lab 06/26/19 0740 06/26/19 1127 06/26/19 1630 06/26/19 2148 06/27/19 0814  GLUCAP 168* 226* 122* 267* 162*    Time coordinating discharge:  36 minutes  Signed:  Orson Eva, DO [facility] Pager: [phone] 06/27/2019, 11:02 AM

## 2019-06-27 NOTE — Plan of Care (Signed)

## 2019-06-29 ENCOUNTER — Other Ambulatory Visit: Payer: Self-pay

## 2019-06-29 NOTE — Patient Outreach (Signed)
New referral: Dx:  Chronic kidney disease MD: Dr. [name] MD office does own transition of care.  Placed call to patient and explained in depth to patient free program to assist him with his chronic diseased. Patient reports that he is doing great and does not need any help. Reviewed ability to provide education on DM and patient refused.  Patient did agree to a mailed letter with my contact information. Encouraged patient to call me if he changes his mind.  Tomasa Rand, RN, BSN, CEN [facility] [employer] [phone]

## 2019-07-03 ENCOUNTER — Emergency Department (HOSPITAL_COMMUNITY): Payer: Medicare Other

## 2019-07-03 ENCOUNTER — Other Ambulatory Visit: Payer: Self-pay

## 2019-07-03 ENCOUNTER — Encounter (HOSPITAL_COMMUNITY): Payer: Self-pay

## 2019-07-03 ENCOUNTER — Inpatient Hospital Stay (HOSPITAL_COMMUNITY)
Admission: EM | Admit: 2019-07-03 | Discharge: 2019-07-16 | DRG: 291 | Disposition: A | Discharge status: Rehabilitation Facility | Payer: Medicare Other | Attending: Internal Medicine | Admitting: Internal Medicine

## 2019-07-03 DIAGNOSIS — D631 Anemia in chronic kidney disease: Secondary | ICD-10-CM | POA: Diagnosis present

## 2019-07-03 DIAGNOSIS — R000 Tachycardia, unspecified: Secondary | ICD-10-CM | POA: Diagnosis not present

## 2019-07-03 DIAGNOSIS — Z800 Family history of malignant neoplasm of digestive organs: Secondary | ICD-10-CM

## 2019-07-03 DIAGNOSIS — I132 Hypertensive heart and chronic kidney disease with heart failure and with stage 5 chronic kidney disease, or end stage renal disease: Principal | ICD-10-CM | POA: Diagnosis present

## 2019-07-03 DIAGNOSIS — Z8249 Family history of ischemic heart disease and other diseases of the circulatory system: Secondary | ICD-10-CM

## 2019-07-03 DIAGNOSIS — N179 Acute kidney failure, unspecified: Secondary | ICD-10-CM | POA: Diagnosis present

## 2019-07-03 DIAGNOSIS — N185 Chronic kidney disease, stage 5: Secondary | ICD-10-CM | POA: Diagnosis present

## 2019-07-03 DIAGNOSIS — E8770 Fluid overload, unspecified: Secondary | ICD-10-CM | POA: Diagnosis present

## 2019-07-03 DIAGNOSIS — R188 Other ascites: Secondary | ICD-10-CM | POA: Diagnosis present

## 2019-07-03 DIAGNOSIS — Z6841 Body Mass Index (BMI) 40.0 and over, adult: Secondary | ICD-10-CM

## 2019-07-03 DIAGNOSIS — E1165 Type 2 diabetes mellitus with hyperglycemia: Secondary | ICD-10-CM

## 2019-07-03 DIAGNOSIS — Z20828 Contact with and (suspected) exposure to other viral communicable diseases: Secondary | ICD-10-CM | POA: Diagnosis present

## 2019-07-03 DIAGNOSIS — I509 Heart failure, unspecified: Secondary | ICD-10-CM | POA: Diagnosis not present

## 2019-07-03 DIAGNOSIS — E1122 Type 2 diabetes mellitus with diabetic chronic kidney disease: Secondary | ICD-10-CM | POA: Diagnosis present

## 2019-07-03 DIAGNOSIS — R601 Generalized edema: Secondary | ICD-10-CM | POA: Diagnosis not present

## 2019-07-03 DIAGNOSIS — E669 Obesity, unspecified: Secondary | ICD-10-CM | POA: Diagnosis present

## 2019-07-03 DIAGNOSIS — N186 End stage renal disease: Secondary | ICD-10-CM | POA: Diagnosis not present

## 2019-07-03 DIAGNOSIS — K760 Fatty (change of) liver, not elsewhere classified: Secondary | ICD-10-CM | POA: Diagnosis present

## 2019-07-03 DIAGNOSIS — Z794 Long term (current) use of insulin: Secondary | ICD-10-CM | POA: Diagnosis not present

## 2019-07-03 DIAGNOSIS — E872 Acidosis: Secondary | ICD-10-CM | POA: Diagnosis not present

## 2019-07-03 DIAGNOSIS — I5043 Acute on chronic combined systolic (congestive) and diastolic (congestive) heart failure: Secondary | ICD-10-CM | POA: Diagnosis present

## 2019-07-03 DIAGNOSIS — E876 Hypokalemia: Secondary | ICD-10-CM | POA: Diagnosis present

## 2019-07-03 DIAGNOSIS — R600 Localized edema: Secondary | ICD-10-CM

## 2019-07-03 DIAGNOSIS — R52 Pain, unspecified: Secondary | ICD-10-CM

## 2019-07-03 DIAGNOSIS — Z79899 Other long term (current) drug therapy: Secondary | ICD-10-CM

## 2019-07-03 DIAGNOSIS — I10 Essential (primary) hypertension: Secondary | ICD-10-CM | POA: Diagnosis present

## 2019-07-03 DIAGNOSIS — Z888 Allergy status to other drugs, medicaments and biological substances status: Secondary | ICD-10-CM

## 2019-07-03 DIAGNOSIS — D509 Iron deficiency anemia, unspecified: Secondary | ICD-10-CM | POA: Diagnosis present

## 2019-07-03 DIAGNOSIS — I5042 Chronic combined systolic (congestive) and diastolic (congestive) heart failure: Secondary | ICD-10-CM | POA: Diagnosis present

## 2019-07-03 DIAGNOSIS — R918 Other nonspecific abnormal finding of lung field: Secondary | ICD-10-CM | POA: Diagnosis not present

## 2019-07-03 DIAGNOSIS — M109 Gout, unspecified: Secondary | ICD-10-CM | POA: Diagnosis present

## 2019-07-03 DIAGNOSIS — E1140 Type 2 diabetes mellitus with diabetic neuropathy, unspecified: Secondary | ICD-10-CM | POA: Diagnosis present

## 2019-07-03 DIAGNOSIS — E785 Hyperlipidemia, unspecified: Secondary | ICD-10-CM | POA: Diagnosis present

## 2019-07-03 DIAGNOSIS — Z825 Family history of asthma and other chronic lower respiratory diseases: Secondary | ICD-10-CM

## 2019-07-03 DIAGNOSIS — I110 Hypertensive heart disease with heart failure: Secondary | ICD-10-CM | POA: Diagnosis not present

## 2019-07-03 DIAGNOSIS — Z833 Family history of diabetes mellitus: Secondary | ICD-10-CM

## 2019-07-03 LAB — BASIC METABOLIC PANEL
Anion gap: 14 (ref 5–15)
BUN: 65 mg/dL — ABNORMAL HIGH (ref 6–20)
CO2: 22 mmol/L (ref 22–32)
Calcium: 8 mg/dL — ABNORMAL LOW (ref 8.9–10.3)
Chloride: 97 mmol/L — ABNORMAL LOW (ref 98–111)
Creatinine, Ser: 5.15 mg/dL — ABNORMAL HIGH (ref 0.61–1.24)
GFR calc Af Amer: 14 mL/min — ABNORMAL LOW (ref >60)
GFR calc non Af Amer: 12 mL/min — ABNORMAL LOW (ref >60)
Glucose, Bld: 243 mg/dL — ABNORMAL HIGH (ref 70–99)
Potassium: 3.5 mmol/L (ref 3.5–5.1)
Sodium: 133 mmol/L — ABNORMAL LOW (ref 135–145)

## 2019-07-03 LAB — CBC
HCT: 36.7 % — ABNORMAL LOW (ref 39.0–52.0)
Hemoglobin: 11.6 g/dL — ABNORMAL LOW (ref 13.0–17.0)
MCH: 24.5 pg — ABNORMAL LOW (ref 26.0–34.0)
MCHC: 31.6 g/dL (ref 30.0–36.0)
MCV: 77.4 fL — ABNORMAL LOW (ref 80.0–100.0)
Platelets: 334 10*3/uL (ref 150–400)
RBC: 4.74 MIL/uL (ref 4.22–5.81)
RDW: 15.3 % (ref 11.5–15.5)
WBC: 10.9 10*3/uL — ABNORMAL HIGH (ref 4.0–10.5)
nRBC: 0 % (ref 0.0–0.2)

## 2019-07-03 LAB — PROTIME-INR
INR: 1.1 (ref 0.8–1.2)
Prothrombin Time: 14.3 seconds (ref 11.4–15.2)

## 2019-07-03 LAB — GLUCOSE, CAPILLARY: Glucose-Capillary: 290 mg/dL — ABNORMAL HIGH (ref 70–99)

## 2019-07-03 LAB — SARS CORONAVIRUS 2 BY RT PCR (HOSPITAL ORDER, PERFORMED IN ~~LOC~~ HOSPITAL LAB): SARS Coronavirus 2: NEGATIVE

## 2019-07-03 MED ORDER — MORPHINE SULFATE (PF) 4 MG/ML IV SOLN
4.0000 mg | Freq: Once | INTRAVENOUS | Status: AC
  Administered 2019-07-03: 4 mg via INTRAVENOUS
  Filled 2019-07-03: qty 1

## 2019-07-03 MED ORDER — INSULIN DETEMIR 100 UNIT/ML ~~LOC~~ SOLN
35.0000 [IU] | Freq: Two times a day (BID) | SUBCUTANEOUS | Status: DC
  Administered 2019-07-03 – 2019-07-07 (×8): 35 [IU] via SUBCUTANEOUS
  Filled 2019-07-03 (×11): qty 0.35

## 2019-07-03 MED ORDER — SODIUM CHLORIDE 0.9 % IV SOLN: 250.0000 mL | INTRAVENOUS | Status: DC | PRN

## 2019-07-03 MED ORDER — LINACLOTIDE 145 MCG PO CAPS
145.0000 ug | ORAL_CAPSULE | Freq: Every day | ORAL | Status: DC
  Administered 2019-07-04 – 2019-07-16 (×9): 145 ug via ORAL
  Filled 2019-07-03 (×12): qty 1

## 2019-07-03 MED ORDER — FLUTICASONE PROPIONATE 50 MCG/ACT NA SUSP
1.0000 | Freq: Every day | NASAL | Status: DC
  Administered 2019-07-04 – 2019-07-16 (×7): 1 via NASAL
  Filled 2019-07-03 (×3): qty 16

## 2019-07-03 MED ORDER — HYDROXYZINE HCL 25 MG PO TABS: 25.0000 mg | ORAL_TABLET | Freq: Three times a day (TID) | ORAL | Status: DC | PRN

## 2019-07-03 MED ORDER — ALBUTEROL SULFATE (2.5 MG/3ML) 0.083% IN NEBU: 3.0000 mL | INHALATION_SOLUTION | RESPIRATORY_TRACT | Status: DC | PRN

## 2019-07-03 MED ORDER — NEPRO/CARBSTEADY PO LIQD: 237.0000 mL | Freq: Three times a day (TID) | ORAL | Status: DC | PRN

## 2019-07-03 MED ORDER — INSULIN ASPART 100 UNIT/ML ~~LOC~~ SOLN
0.0000 [IU] | Freq: Every day | SUBCUTANEOUS | Status: DC
  Administered 2019-07-03: 3 [IU] via SUBCUTANEOUS
  Administered 2019-07-04: 2 [IU] via SUBCUTANEOUS
  Administered 2019-07-05: 3 [IU] via SUBCUTANEOUS
  Administered 2019-07-06 – 2019-07-15 (×8): 2 [IU] via SUBCUTANEOUS

## 2019-07-03 MED ORDER — SORBITOL 70 % SOLN: 30.0000 mL | Status: DC | PRN

## 2019-07-03 MED ORDER — INSULIN ASPART 100 UNIT/ML ~~LOC~~ SOLN
0.0000 [IU] | Freq: Three times a day (TID) | SUBCUTANEOUS | Status: DC
  Administered 2019-07-04: 7 [IU] via SUBCUTANEOUS
  Administered 2019-07-04: 4 [IU] via SUBCUTANEOUS
  Administered 2019-07-04 – 2019-07-05 (×2): 7 [IU] via SUBCUTANEOUS
  Administered 2019-07-05: 4 [IU] via SUBCUTANEOUS
  Administered 2019-07-06: 11 [IU] via SUBCUTANEOUS
  Administered 2019-07-06: 3 [IU] via SUBCUTANEOUS
  Administered 2019-07-07 – 2019-07-08 (×4): 4 [IU] via SUBCUTANEOUS
  Administered 2019-07-08: 7 [IU] via SUBCUTANEOUS
  Administered 2019-07-09: 4 [IU] via SUBCUTANEOUS
  Administered 2019-07-09: 11 [IU] via SUBCUTANEOUS
  Administered 2019-07-09: 7 [IU] via SUBCUTANEOUS
  Administered 2019-07-10: 4 [IU] via SUBCUTANEOUS
  Administered 2019-07-10: 3 [IU] via SUBCUTANEOUS
  Administered 2019-07-11 (×2): 4 [IU] via SUBCUTANEOUS
  Administered 2019-07-11 – 2019-07-12 (×2): 7 [IU] via SUBCUTANEOUS
  Administered 2019-07-12: 3 [IU] via SUBCUTANEOUS
  Administered 2019-07-13: 4 [IU] via SUBCUTANEOUS
  Administered 2019-07-13: 3 [IU] via SUBCUTANEOUS
  Administered 2019-07-13: 7 [IU] via SUBCUTANEOUS
  Administered 2019-07-14: 4 [IU] via SUBCUTANEOUS
  Administered 2019-07-14 (×2): 3 [IU] via SUBCUTANEOUS
  Administered 2019-07-15: 4 [IU] via SUBCUTANEOUS
  Administered 2019-07-15: 7 [IU] via SUBCUTANEOUS
  Administered 2019-07-15: 4 [IU] via SUBCUTANEOUS
  Administered 2019-07-16: 3 [IU] via SUBCUTANEOUS

## 2019-07-03 MED ORDER — CLONIDINE HCL 0.3 MG PO TABS
0.3000 mg | ORAL_TABLET | Freq: Three times a day (TID) | ORAL | Status: DC
  Administered 2019-07-03 – 2019-07-16 (×32): 0.3 mg via ORAL
  Filled 2019-07-03 (×33): qty 1

## 2019-07-03 MED ORDER — FUROSEMIDE 10 MG/ML IJ SOLN
80.0000 mg | Freq: Once | INTRAMUSCULAR | Status: AC
  Administered 2019-07-03: 80 mg via INTRAVENOUS
  Filled 2019-07-03: qty 8

## 2019-07-03 MED ORDER — ATORVASTATIN CALCIUM 80 MG PO TABS
80.0000 mg | ORAL_TABLET | Freq: Every day | ORAL | Status: DC
  Administered 2019-07-04 – 2019-07-16 (×11): 80 mg via ORAL
  Filled 2019-07-03: qty 2
  Filled 2019-07-03: qty 1
  Filled 2019-07-03: qty 2
  Filled 2019-07-03 (×4): qty 1
  Filled 2019-07-03: qty 2
  Filled 2019-07-03 (×3): qty 1

## 2019-07-03 MED ORDER — HEPARIN SODIUM (PORCINE) 5000 UNIT/ML IJ SOLN
5000.0000 [IU] | Freq: Three times a day (TID) | INTRAMUSCULAR | Status: DC
  Administered 2019-07-03 – 2019-07-07 (×12): 5000 [IU] via SUBCUTANEOUS
  Filled 2019-07-03 (×12): qty 1

## 2019-07-03 MED ORDER — ASPIRIN EC 81 MG PO TBEC
81.0000 mg | DELAYED_RELEASE_TABLET | Freq: Every day | ORAL | Status: DC
  Administered 2019-07-03 – 2019-07-16 (×12): 81 mg via ORAL
  Filled 2019-07-03 (×12): qty 1

## 2019-07-03 MED ORDER — ZOLPIDEM TARTRATE 5 MG PO TABS: 5.0000 mg | ORAL_TABLET | Freq: Every evening | ORAL | Status: DC | PRN

## 2019-07-03 MED ORDER — DOCUSATE SODIUM 283 MG RE ENEM
1.0000 | ENEMA | RECTAL | Status: DC | PRN
  Filled 2019-07-03: qty 1

## 2019-07-03 MED ORDER — EZETIMIBE 10 MG PO TABS
10.0000 mg | ORAL_TABLET | Freq: Every evening | ORAL | Status: DC
  Administered 2019-07-03 – 2019-07-15 (×12): 10 mg via ORAL
  Filled 2019-07-03 (×13): qty 1

## 2019-07-03 MED ORDER — SODIUM CHLORIDE 0.9% FLUSH: 3.0000 mL | INTRAVENOUS | Status: DC | PRN

## 2019-07-03 MED ORDER — SODIUM BICARBONATE 650 MG PO TABS
650.0000 mg | ORAL_TABLET | Freq: Two times a day (BID) | ORAL | Status: DC
  Administered 2019-07-03 – 2019-07-04 (×3): 650 mg via ORAL
  Filled 2019-07-03 (×3): qty 1

## 2019-07-03 MED ORDER — GABAPENTIN 100 MG PO CAPS
100.0000 mg | ORAL_CAPSULE | Freq: Three times a day (TID) | ORAL | Status: DC
  Administered 2019-07-03 – 2019-07-16 (×36): 100 mg via ORAL
  Filled 2019-07-03 (×36): qty 1

## 2019-07-03 MED ORDER — SODIUM CHLORIDE 0.9% FLUSH
3.0000 mL | Freq: Two times a day (BID) | INTRAVENOUS | Status: DC
  Administered 2019-07-03 – 2019-07-16 (×26): 3 mL via INTRAVENOUS

## 2019-07-03 MED ORDER — HYDROCODONE-ACETAMINOPHEN 5-325 MG PO TABS
1.0000 | ORAL_TABLET | Freq: Four times a day (QID) | ORAL | Status: DC | PRN
  Administered 2019-07-03: 2 via ORAL
  Administered 2019-07-04 (×2): 1 via ORAL
  Administered 2019-07-07 – 2019-07-14 (×9): 2 via ORAL
  Administered 2019-07-15: 1 via ORAL
  Filled 2019-07-03: qty 1
  Filled 2019-07-03 (×2): qty 2
  Filled 2019-07-03: qty 1
  Filled 2019-07-03 (×9): qty 2

## 2019-07-03 MED ORDER — CALCIUM CARBONATE ANTACID 1250 MG/5ML PO SUSP
500.0000 mg | Freq: Four times a day (QID) | ORAL | Status: DC | PRN
  Filled 2019-07-03: qty 5

## 2019-07-03 MED ORDER — ACETAMINOPHEN 325 MG PO TABS
650.0000 mg | ORAL_TABLET | ORAL | Status: DC | PRN
  Administered 2019-07-03 – 2019-07-08 (×2): 650 mg via ORAL
  Filled 2019-07-03 (×2): qty 2

## 2019-07-03 MED ORDER — CARVEDILOL 25 MG PO TABS
25.0000 mg | ORAL_TABLET | Freq: Two times a day (BID) | ORAL | Status: DC
  Administered 2019-07-03 – 2019-07-16 (×21): 25 mg via ORAL
  Filled 2019-07-03 (×2): qty 1
  Filled 2019-07-03: qty 2
  Filled 2019-07-03 (×5): qty 1
  Filled 2019-07-03 (×3): qty 2
  Filled 2019-07-03: qty 1
  Filled 2019-07-03: qty 2
  Filled 2019-07-03 (×2): qty 1
  Filled 2019-07-03: qty 2
  Filled 2019-07-03 (×2): qty 1
  Filled 2019-07-03: qty 2
  Filled 2019-07-03 (×2): qty 1
  Filled 2019-07-03: qty 2

## 2019-07-03 MED ORDER — FUROSEMIDE 10 MG/ML IJ SOLN
60.0000 mg | Freq: Four times a day (QID) | INTRAMUSCULAR | Status: DC
  Administered 2019-07-03 – 2019-07-05 (×6): 60 mg via INTRAVENOUS
  Filled 2019-07-03 (×7): qty 6

## 2019-07-03 MED ORDER — ONDANSETRON HCL 4 MG/2ML IJ SOLN: 4.0000 mg | Freq: Four times a day (QID) | INTRAMUSCULAR | Status: DC | PRN

## 2019-07-03 MED ORDER — CAMPHOR-MENTHOL 0.5-0.5 % EX LOTN
1.0000 "application " | TOPICAL_LOTION | Freq: Three times a day (TID) | CUTANEOUS | Status: DC | PRN
  Filled 2019-07-03 (×2): qty 222

## 2019-07-03 NOTE — ED Notes (Signed)
Report to Morgan, RN 

## 2019-07-03 NOTE — ED Provider Notes (Signed)
[facility] Provider Note   CSN: [id number] Arrival date & time: 07/03/19  1207     History   Chief Complaint Chief Complaint  Patient presents with  . Leg Swelling    HPI Kelly Key is a 48 y.o. male presenting for evaluation of leg swelling and weakness.  Patient states since he was discharged from the hospital about 1 week ago, he has had worsening leg swelling and weakness.  His leg swelling has gotten to the point where he is unable to ambulate.  He has been taking his furosemide as prescribed, and reports good urine output but continued swelling.  He is unable to weigh himself, as he is unable to stand on the scale.  He denies fevers, chills, cough, chest pain, shortness of breath, nausea, vomiting, abdominal pain.  He has not taken anything for pain including Tylenol or ibuprofen.  Additional history obtained per chart review.  Patient was admitted 8/26 for CHF and CKD, diabetes, hypertension, and leg pain/edema.  At that time, he was receiving 80 mg of IV Lasix 3 times daily.  Of note, patient's creatinine has gotten significantly worse since April 2020.     HPI  Past Medical History:  Diagnosis Date  . Acute diastolic CHF (congestive heart failure) ([place]) 06/11/2012   EF 50-55% [facility])  . Diabetes mellitus    A1c 11.5 (06/11/2012).  . Gout   . Hepatic steatosis 06/11/2012   Elevated LFTs  . Hyperlipemia   . Malignant hypertension   . Microcytic anemia 06/12/2012  . Obesity     Patient Active Problem List   Diagnosis Date Noted  . Anasarca 07/03/2019  . Dyslipidemia 07/03/2019  . Chronic combined systolic and diastolic CHF (congestive heart failure) ([place]) 07/03/2019  . Bilateral leg edema   . CKD (chronic kidney disease) stage 5, GFR less than 15 ml/min ([facility]) 06/22/2019  . Uncontrolled type 2 diabetes mellitus with hyperglycemia, with long-term current use of insulin ([place]) 06/22/2019  . Hypokalemia   . Thromboembolism ([place])  02/20/2019  . AKI (acute kidney injury) ([place]) 02/20/2019  . Acute on chronic diastolic CHF (congestive heart failure) ([place]) 03/26/2018  . Acute respiratory failure with hypoxia ([place]) 03/26/2018  . Hyponatremia 06/12/2012  . Microcytic anemia 06/12/2012  . HTN (hypertension) 06/11/2012  . Elevated LFTs 06/11/2012  . Morbid obesity ([place]) 06/11/2012  . Hepatic steatosis 06/11/2012    Past Surgical History:  Procedure Laterality Date  . NO PAST SURGERIES          Home Medications    Prior to Admission medications   Medication Sig Start Date End Date Taking? Authorizing Provider  albuterol (VENTOLIN HFA) 108 (90 Base) MCG/ACT inhaler Inhale 2 puffs into the lungs every 4 (four) hours as needed. 03/24/19  Yes [provider]  amLODipine (NORVASC) 10 MG tablet Take 10 mg by mouth daily.   Yes [provider]  atorvastatin (LIPITOR) 80 MG tablet Take 1 tablet by mouth daily. 06/06/19  Yes [provider]  carvedilol (COREG) 25 MG tablet Take 1 tablet (25 mg total) by mouth 2 (two) times daily with a meal. 06/27/19  Yes Tat, David, MD  cloNIDine (CATAPRES) 0.3 MG tablet Take 1 tablet (0.3 mg total) by mouth 3 (three) times daily. 04/04/18 07/03/19 Yes Shah, Pratik D, DO  ergocalciferol (VITAMIN D2) 1.25 MG (50000 UT) capsule Take 50,000 Units by mouth once a week.   Yes [provider]  ezetimibe (ZETIA) 10 MG tablet Take  10 mg by mouth every evening. 01/20/19  Yes [provider]  fluticasone (FLONASE) 50 MCG/ACT nasal spray Place 1 spray into both nostrils daily. 04/04/18 07/03/19 Yes Shah, Pratik D, DO  gabapentin (NEURONTIN) 100 MG capsule Take 1 capsule by mouth 3 (three) times daily. 05/12/19  Yes [provider]  insulin aspart (NOVOLOG) 100 UNIT/ML injection Inject 5-15 Units into the skin 3 (three) times daily before meals.    Yes [provider]  insulin detemir (LEVEMIR) 100 UNIT/ML injection Inject 0.35 mLs (35 Units total) into  the skin 2 (two) times daily. 06/27/19  Yes Tat, Shanon Brow, MD  Insulin Pen Needle 31G X 5 MM MISC Use as directed. 04/04/18  Yes Manuella Ghazi, Pratik D, DO  linaclotide Rolan Lipa) 145 MCG CAPS capsule Take 1 capsule (145 mcg total) by mouth daily before breakfast. 04/05/18  Yes Manuella Ghazi, Pratik D, DO  potassium chloride SA (K-DUR) 20 MEQ tablet Take 1 tablet (20 mEq total) by mouth daily. 02/28/19  Yes Johnson, Clanford L, MD  sodium bicarbonate 650 MG tablet Take 650 mg by mouth 2 (two) times daily.   Yes [provider]  torsemide (DEMADEX) 20 MG tablet Take 3 tablets (60 mg total) by mouth 2 (two) times daily. 06/27/19  Yes TatShanon Brow, MD    Family History Family History  Problem Relation Age of Onset  . Gout Mother   . Asthma Mother   . Diabetes Father   . Heart failure Father   . Diabetes Sister   . Hypertension Brother   . Pancreatic cancer Brother   . Diabetes Sister     Social History Social History   Tobacco Use  . Smoking status: Never Smoker  . Smokeless tobacco: Never Used  Substance Use Topics  . Alcohol use: No    Comment: haven't drank in over 5 years   . Drug use: No     Allergies   Hydralazine and Lisinopril   Review of Systems Review of Systems  Cardiovascular: Positive for leg swelling.  Musculoskeletal: Positive for myalgias.  All other systems reviewed and are negative.    Physical Exam Updated Vital Signs BP (!) 159/93 (BP Location: Right Arm)   Pulse (!) 104   Temp 98.6 F (37 C) (Oral)   Resp 19   Ht 5\' 7"  (1.702 m)   Wt (!) 141.5 kg   SpO2 97%   BMI 48.87 kg/m   Physical Exam Vitals signs and nursing note reviewed.  Constitutional:      General: He is not in acute distress.    Appearance: He is well-developed.     Comments: Appears uncomfortable due to pain, nontoxic in appearance  HENT:     Head: Normocephalic and atraumatic.  Eyes:     Conjunctiva/sclera: Conjunctivae normal.     Pupils: Pupils are equal, round, and reactive to light.   Neck:     Musculoskeletal: Normal range of motion and neck supple.  Cardiovascular:     Rate and Rhythm: Regular rhythm. Tachycardia present.     Pulses: Normal pulses.     Comments: Mildly tachycardic around 105 Pulmonary:     Effort: Pulmonary effort is normal. No respiratory distress.     Breath sounds: Normal breath sounds. No wheezing.     Comments: Clear lung sounds Abdominal:     General: There is no distension.     Palpations: Abdomen is soft. There is no mass.     Tenderness: There is no abdominal tenderness. There is  no guarding or rebound.  Musculoskeletal:        General: Swelling and tenderness present.     Comments: 2+ pitting edema of the lower extremities up to the knees.  Tenderness palpation.  Worse on the right side (baseline per pt).   Skin:    General: Skin is warm and dry.     Capillary Refill: Capillary refill takes less than 2 seconds.  Neurological:     Mental Status: He is alert and oriented to person, place, and time.      ED Treatments / Results  Labs (all labs ordered are listed, but only abnormal results are displayed) Labs Reviewed  BASIC METABOLIC PANEL - Abnormal; Notable for the following components:      Result Value   Sodium 133 (*)    Chloride 97 (*)    Glucose, Bld 243 (*)    BUN 65 (*)    Creatinine, Ser 5.15 (*)    Calcium 8.0 (*)    GFR calc non Af Amer 12 (*)    GFR calc Af Amer 14 (*)    All other components within normal limits  CBC - Abnormal; Notable for the following components:   WBC 10.9 (*)    Hemoglobin 11.6 (*)    HCT 36.7 (*)    MCV 77.4 (*)    MCH 24.5 (*)    All other components within normal limits  GLUCOSE, CAPILLARY - Abnormal; Notable for the following components:   Glucose-Capillary 290 (*)    All other components within normal limits  SARS CORONAVIRUS 2 (HOSPITAL ORDER, [place] [facility])  PROTIME-INR  CBC WITH DIFFERENTIAL/PLATELET  BASIC METABOLIC PANEL     EKG None  Radiology Dg Chest 2 View  Result Date: 07/03/2019 CLINICAL DATA:  History of end-stage renal disease and CHF. EXAM: CHEST - 2 VIEW COMPARISON:  Chest radiograph 06/22/2019, 04/28/2019 FINDINGS: Stable cardiomediastinal contours with enlarged heart size. A few minimal linear opacities at the right lung base likely reflect atelectasis or scar. No new focal infiltrate or evidence of edema. No pneumothorax or pleural effusion. No acute findings in the visualized skeleton. IMPRESSION: No evidence of active disease. Electronically Signed   By: Audie Pinto M.D.   On: 07/03/2019 13:57    Procedures Procedures (including critical care time)  Medications Ordered in ED Medications  atorvastatin (LIPITOR) tablet 80 mg (has no administration in time range)  carvedilol (COREG) tablet 25 mg (25 mg Oral Given 07/03/19 2018)  cloNIDine (CATAPRES) tablet 0.3 mg (0.3 mg Oral Given 07/03/19 2027)  ezetimibe (ZETIA) tablet 10 mg (10 mg Oral Given 07/03/19 2018)  insulin detemir (LEVEMIR) injection 35 Units (has no administration in time range)  linaclotide (LINZESS) capsule 145 mcg (has no administration in time range)  sodium bicarbonate tablet 650 mg (has no administration in time range)  gabapentin (NEURONTIN) capsule 100 mg (has no administration in time range)  albuterol (PROVENTIL) (2.5 MG/3ML) 0.083% nebulizer solution 3 mL (has no administration in time range)  fluticasone (FLONASE) 50 MCG/ACT nasal spray 1 spray (has no administration in time range)  sodium chloride flush (NS) 0.9 % injection 3 mL (has no administration in time range)  sodium chloride flush (NS) 0.9 % injection 3 mL (has no administration in time range)  0.9 %  sodium chloride infusion (has no administration in time range)  aspirin EC tablet 81 mg (81 mg Oral Given 07/03/19 2018)  acetaminophen (TYLENOL) tablet 650 mg (650 mg Oral Given 07/03/19  2004)  ondansetron (ZOFRAN) injection 4 mg (has no administration in time  range)  zolpidem (AMBIEN) tablet 5 mg (has no administration in time range)  sorbitol 70 % solution 30 mL (has no administration in time range)  docusate sodium (ENEMEEZ) enema 283 mg (has no administration in time range)  camphor-menthol (SARNA) lotion 1 application (has no administration in time range)    And  hydrOXYzine (ATARAX/VISTARIL) tablet 25 mg (has no administration in time range)  calcium carbonate (dosed in mg elemental calcium) suspension 500 mg of elemental calcium (has no administration in time range)  feeding supplement (NEPRO CARB STEADY) liquid 237 mL (has no administration in time range)  heparin injection 5,000 Units (has no administration in time range)  furosemide (LASIX) injection 60 mg (has no administration in time range)  insulin aspart (novoLOG) injection 0-20 Units (has no administration in time range)  insulin aspart (novoLOG) injection 0-5 Units (has no administration in time range)  HYDROcodone-acetaminophen (NORCO/VICODIN) 5-325 MG per tablet 1-2 tablet (2 tablets Oral Given 07/03/19 2028)  furosemide (LASIX) injection 80 mg (80 mg Intravenous Given 07/03/19 1441)  morphine 4 MG/ML injection 4 mg (4 mg Intravenous Given 07/03/19 1441)     Initial Impression / Assessment and Plan / ED Course  I have reviewed the triage vital signs and the nursing notes.  Pertinent labs & imaging results that were available during my care of the patient were reviewed by me and considered in my medical decision making (see chart for details).        Patient presenting for evaluation of leg swelling pain causing difficulty walking.  Physical exam shows patient who has obvious peripheral edema.  He is mildly tachycardic, although does not appear respiratory distress.  He is satting comfortably on room air.  Will obtain labs, give IV Lasix, and pain control, and reassess.  Labs similar to when he was discharged with a creatinine of 5.  White count mildly elevated at 10.9.  Chest  x-ray viewed interpreted by me, no pneumonia, pneumothorax, effusion.  Chronic and stable cardiomegaly.  EKG without STEMI.  On reassessment, patient reports slight improvement in his legs.  Attempted to ambulate, but patient was a unable to do so due to pain and weakness in his legs.  Additionally, he was able to take 5 steps and became very short of breath, had to lean over his walker and was unable to support himself.  As such, I do not believe it is safe for him to go home.  I believe he would benefit from admission for IV Lasix.  Will call for admission.  Discussed with Dr. Lorin Mercy [facility], patient to be admitted.  Final Clinical Impressions(s) / ED Diagnoses   Final diagnoses:  None    ED Discharge Orders    None       Franchot Heidelberg, PA-C 07/03/19 2127    Julianne Rice, MD 07/06/19 1444

## 2019-07-03 NOTE — ED Notes (Signed)
Pt has not voided since lasix given

## 2019-07-03 NOTE — H&P (Signed)
History and Physical    Kelly Key [id number] DOB: 1971-02-17 DOA: 07/03/2019  PCP: [name], MD Consultants:  Lowanda Foster - nephrology; Fields - vascularEngineer, agricultural - cardiology Patient coming from:  Home - lives alone; NOK: Sister or Kelly Key, [phone]  Chief Complaint: Worsening edema  HPI: Kelly Key is a 48 y.o. male with medical history significant of obesity (BMI 49); HTN; HLD: DM; stage 4 CKD; and chronic combined CHF (EF 50-55% in 2013) presenting with increased LE edema, decreased ability to ambulate.  He felt better when he left the hospital.  During the week, his legs got weak and he fell once.  He started developing worsening LE edema.  It has been particularly bad for the last 3 days.  He denies SOB.  His weight has "probably" changed.  He can't lie flat because his feet hurt too much and feel like they weigh too much.  No PND.  No fevers.  He does not want to go to rehab.  He was admitted from 8/26-30 with anasarca from a combination of stage 4 CKD and acute diastolic CHF.  There was concern for underlying nephrotic syndrome and FSGS.  New baseline creatinine was thought to be about 5; he previously declined dialysis but was perhaps changing his mind.    ED Course:   Worsening CHF, mostly LE edema with difficulty walking.  Recently here for the same.  On Lasix 80 mg IV TID with some improvement but worsening edema.  Given Lasix and pain control, but really unable to ambulate due to SOB and edema.  Sats stable, CXR appears to be at baseline.  HR 105.  Review of Systems: As per HPI; otherwise review of systems reviewed and negative.   Ambulatory Status:  Ambulates with a walker, but currently can barely it with a walker  Past Medical History:  Diagnosis Date  . Acute diastolic CHF (congestive heart failure) ([place]) 06/11/2012   EF 50-55% [facility])  . Diabetes mellitus    A1c 11.5 (06/11/2012).  . Gout   . Hepatic steatosis 06/11/2012   Elevated LFTs  . Hyperlipemia    . Malignant hypertension   . Microcytic anemia 06/12/2012  . Obesity     Past Surgical History:  Procedure Laterality Date  . NO PAST SURGERIES      Social History   Socioeconomic History  . Marital status: Single    Spouse name: Not on file  . Number of children: Not on file  . Years of education: Not on file  . Highest education level: Not on file  Occupational History  . Occupation: retired  Scientific laboratory technician  . Financial resource strain: Not hard at all  . Food insecurity    Worry: Patient refused    Inability: Patient refused  . Transportation needs    Medical: Patient refused    Non-medical: Patient refused  Tobacco Use  . Smoking status: Never Smoker  . Smokeless tobacco: Never Used  Substance and Sexual Activity  . Alcohol use: No    Comment: haven't drank in over 5 years   . Drug use: No  . Sexual activity: Not Currently  Lifestyle  . Physical activity    Days per week: Patient refused    Minutes per session: Patient refused  . Stress: Not at all  Relationships  . Social connections    Talks on phone: Patient refused    Gets together: Patient refused    Attends religious service: Patient refused    Active  member of club or organization: Patient refused    Attends meetings of clubs or organizations: Patient refused    Relationship status: Patient refused  . Intimate partner violence    Fear of current or ex partner: Patient refused    Emotionally abused: Patient refused    Physically abused: Patient refused    Forced sexual activity: Patient refused  Other Topics Concern  . Not on file  Social History Narrative  . Not on file    Allergies  Allergen Reactions  . Hydralazine   . Lisinopril Swelling    Family History  Problem Relation Age of Onset  . Gout Mother   . Asthma Mother   . Diabetes Father   . Heart failure Father   . Diabetes Sister   . Hypertension Brother   . Pancreatic cancer Brother   . Diabetes Sister     Prior to  Admission medications   Medication Sig Start Date End Date Taking? Authorizing Provider  albuterol (VENTOLIN HFA) 108 (90 Base) MCG/ACT inhaler Inhale 2 puffs into the lungs every 4 (four) hours as needed. 03/24/19  Yes [provider]  amLODipine (NORVASC) 10 MG tablet Take 10 mg by mouth daily.   Yes [provider]  atorvastatin (LIPITOR) 80 MG tablet Take 1 tablet by mouth daily. 06/06/19  Yes [provider]  carvedilol (COREG) 25 MG tablet Take 1 tablet (25 mg total) by mouth 2 (two) times daily with a meal. 06/27/19  Yes Tat, David, MD  cloNIDine (CATAPRES) 0.3 MG tablet Take 1 tablet (0.3 mg total) by mouth 3 (three) times daily. 04/04/18 07/03/19 Yes Shah, Pratik D, DO  ergocalciferol (VITAMIN D2) 1.25 MG (50000 UT) capsule Take 50,000 Units by mouth once a week.   Yes [provider]  ezetimibe (ZETIA) 10 MG tablet Take 10 mg by mouth every evening. 01/20/19  Yes [provider]  fluticasone (FLONASE) 50 MCG/ACT nasal spray Place 1 spray into both nostrils daily. 04/04/18 07/03/19 Yes Shah, Pratik D, DO  gabapentin (NEURONTIN) 100 MG capsule Take 1 capsule by mouth 3 (three) times daily. 05/12/19  Yes [provider]  insulin aspart (NOVOLOG) 100 UNIT/ML injection Inject 5-15 Units into the skin 3 (three) times daily before meals.    Yes [provider]  insulin detemir (LEVEMIR) 100 UNIT/ML injection Inject 0.35 mLs (35 Units total) into the skin 2 (two) times daily. 06/27/19  Yes Tat, Shanon Brow, MD  Insulin Pen Needle 31G X 5 MM MISC Use as directed. 04/04/18  Yes Manuella Ghazi, Pratik D, DO  linaclotide Rolan Lipa) 145 MCG CAPS capsule Take 1 capsule (145 mcg total) by mouth daily before breakfast. 04/05/18  Yes Manuella Ghazi, Pratik D, DO  potassium chloride SA (K-DUR) 20 MEQ tablet Take 1 tablet (20 mEq total) by mouth daily. 02/28/19  Yes Johnson, Clanford L, MD  sodium bicarbonate 650 MG tablet Take 650 mg by mouth 2 (two) times daily.   Yes [provider]  torsemide (DEMADEX) 20 MG tablet Take 3 tablets (60 mg total) by mouth 2 (two) times daily. 06/27/19  Deveron Furlong, MD    Physical Exam: Vitals:   07/03/19 1530 07/03/19 1600 07/03/19 1630 07/03/19 1700  BP: (!) 171/94 (!) 182/103 (!) 185/103 (!) 156/94  Pulse: 100 99 (!) 102 (!) 101  Resp:      Temp:      TempSrc:      SpO2: 94% 95% 97% 96%  Weight:      Height:         .  General:  Appears calm and comfortable and is NAD . Eyes:  PERRL, EOMI, normal lids, iris . ENT:  grossly normal hearing, lips & tongue, mmm; appropriate dentition . Neck:  no LAD, masses or thyromegaly . Cardiovascular:  RR with mild tachycardia, no m/r/g. 3-4+ LE edema extending up to thighs.  . Respiratory:   CTA bilaterally with no wheezes/rales/rhonchi.  Normal respiratory effort. . Abdomen:  soft, NT, ND, NABS . Skin:  Evidence of B LE venous stasis seen on limited exam . Musculoskeletal:  grossly normal tone BUE/BLE, good ROM, no bony abnormality . Psychiatric:  grossly normal mood and affect, speech fluent and appropriate, AOx3 . Neurologic:  CN 2-12 grossly intact, moves all extremities in coordinated fashion, sensation intact    Radiological Exams on Admission: Dg Chest 2 View  Result Date: 07/03/2019 CLINICAL DATA:  History of end-stage renal disease and CHF. EXAM: CHEST - 2 VIEW COMPARISON:  Chest radiograph 06/22/2019, 04/28/2019 FINDINGS: Stable cardiomediastinal contours with enlarged heart size. A few minimal linear opacities at the right lung base likely reflect atelectasis or scar. No new focal infiltrate or evidence of edema. No pneumothorax or pleural effusion. No acute findings in the visualized skeleton. IMPRESSION: No evidence of active disease. Electronically Signed   By: Audie Pinto M.D.   On: 07/03/2019 13:57    EKG: Independently reviewed.  Sinus tachycardia with rate 106; nonspecific ST changes with no evidence of acute ischemia   Labs on Admission: I have personally  reviewed the available labs and imaging studies at the time of the admission.  Pertinent labs:   Na++ 133 Glucose 243 BUN 65/Creatinine 5.15/GFR 12 - stable WBC 10.9 Hgb 11.6 INR 1.1   Assessment/Plan Principal Problem:   Anasarca Active Problems:   HTN (hypertension)   Morbid obesity (HCC)   Acute on chronic diastolic CHF (congestive heart failure) (HCC)   CKD (chronic kidney disease) stage 5, GFR less than 15 ml/min (HCC)   Uncontrolled type 2 diabetes mellitus with hyperglycemia, with long-term current use of insulin (HCC)   Dyslipidemia   Anasarca resulting from acute on chronic CHF combined with progressive CKD -Patient with prior admission for similar issue, effectively diuresed and discharged 1 week ago -Since d/c, despite taking PO diuretics, he has had progressive recurrent LE edema  -His weight is generally unchanged but his edema is marked today -He also has difficulty ambulating, likely from a combination of anasarca and deconditioning -He does not appear to be interested in placement for rehab at this time -Will observe for now with diuresis, PT consult  CKD -Suspect that patient has progressive CKD and is rapidly approaching need for HD -Would encourage consideration of fistula placement -He reported that he would consider PD (previously reported he would not do dialysis); we discussed that PD has to be done for many hours every single day instead of just 4 hours 3 times a week - he has apparently not considered this -Regardless of PD vs. HD, he is nearing that time -Nephrology consult -Will diurese for now with 60 mg IV Lasix q6h  -Continue bicarb for associated metabolic acidosis -He is scheduled for an MRA of his abdomen on 9/18 for evaluation of RAS, but this may be less meaningful if the patient is transitioning to HD -Recent normal renal US (8/27)  Chronic combined CHF -Echo on 8/27 with EF 50-55% and undetermined diastolic function; prior echo in 6/19  with grade-indeterminate diastolic dysfunction -Volume issues appear to be more associated with CKD than  with CHF -He does not have an O2 requirement or CXR abnormalities at this time -His ambulatory SOB appears to be more related to deconditioning than active CHF -Will defer cardiology consultation for now, but will monitor on telemetry for now  Malignant HTN -Poor control -Negative prior evaluation for secondary HTN -Norvasc is suboptimal for now based on anasarca -Continue Coreg, Catapres  HLD -Continue Lipitor  Morbid obesity -BMI 49 -Nutrition consult requested  DM -Very poorly controlled, recent A1c 12.4 -Continue Levemir -Will cover with resistant-scale SSI -Had DM coordinator consultation during prior hospitalization    Note: This patient has been tested and is pending for the novel coronavirus COVID-19.  DVT prophylaxis: Heparin Code Status:  Full - confirmed with patient Family Communication: None present  Disposition Plan:  Home once clinically improved Consults called: Nephrology; PT, nutrition, CM  Admission status: It is my clinical opinion that referral for OBSERVATION is reasonable and necessary in this patient based on the above information provided. The aforementioned taken together are felt to place the patient at high risk for further clinical deterioration. However it is anticipated that the patient may be medically stable for discharge from the hospital within 24 to 48 hours.    Karmen Bongo MD [facility]   How to contact the [facility] Attending or Consulting provider Ken Caryl or covering provider during after hours [place], for this patient?  1. Check the care team in [facility] and look for a) attending/consulting TR[facility] provider listed and b) the [facility] team listed 2. Log into [email or web address] and use [place]'s universal password to access. If you do not have the password, please contact the hospital operator. 3. Locate the [facility] provider you are looking for  under [facility] and page to a number that you can be directly reached. 4. If you still have difficulty reaching the provider, please page the [facility] (Director on Call) for the Hospitalists listed on amion for assistance.   07/03/2019, 6:22 PM

## 2019-07-03 NOTE — ED Triage Notes (Signed)
Pt reports he is in to be seen again for the same reason.  He was recently discharged from hospital. Pt reports that his legs had gone down some prior to discharge but have gone back up. Reports increase pain in legs and difficulty walking

## 2019-07-03 NOTE — ED Notes (Signed)
Attempted ambulation of pt with walker per PA direction  Pt assisted out of bed as he cannot move by himself without great effort   Pt with labored breathing by the third step leaning over the walker and gasping   He is assisted to bed and reattached to monitor  He reports he lives alone  PA apprised

## 2019-07-04 DIAGNOSIS — I132 Hypertensive heart and chronic kidney disease with heart failure and with stage 5 chronic kidney disease, or end stage renal disease: Secondary | ICD-10-CM | POA: Diagnosis not present

## 2019-07-04 DIAGNOSIS — I5032 Chronic diastolic (congestive) heart failure: Secondary | ICD-10-CM | POA: Diagnosis not present

## 2019-07-04 DIAGNOSIS — R188 Other ascites: Secondary | ICD-10-CM | POA: Diagnosis not present

## 2019-07-04 DIAGNOSIS — E1122 Type 2 diabetes mellitus with diabetic chronic kidney disease: Secondary | ICD-10-CM | POA: Diagnosis not present

## 2019-07-04 DIAGNOSIS — N179 Acute kidney failure, unspecified: Secondary | ICD-10-CM | POA: Diagnosis not present

## 2019-07-04 DIAGNOSIS — Z794 Long term (current) use of insulin: Secondary | ICD-10-CM | POA: Diagnosis not present

## 2019-07-04 DIAGNOSIS — R601 Generalized edema: Secondary | ICD-10-CM | POA: Diagnosis not present

## 2019-07-04 DIAGNOSIS — E876 Hypokalemia: Secondary | ICD-10-CM | POA: Diagnosis not present

## 2019-07-04 DIAGNOSIS — I5043 Acute on chronic combined systolic (congestive) and diastolic (congestive) heart failure: Secondary | ICD-10-CM | POA: Diagnosis not present

## 2019-07-04 DIAGNOSIS — D631 Anemia in chronic kidney disease: Secondary | ICD-10-CM | POA: Diagnosis not present

## 2019-07-04 DIAGNOSIS — Z6841 Body Mass Index (BMI) 40.0 and over, adult: Secondary | ICD-10-CM | POA: Diagnosis not present

## 2019-07-04 DIAGNOSIS — E872 Acidosis: Secondary | ICD-10-CM | POA: Diagnosis not present

## 2019-07-04 DIAGNOSIS — N186 End stage renal disease: Secondary | ICD-10-CM | POA: Diagnosis not present

## 2019-07-04 DIAGNOSIS — I130 Hypertensive heart and chronic kidney disease with heart failure and stage 1 through stage 4 chronic kidney disease, or unspecified chronic kidney disease: Secondary | ICD-10-CM | POA: Diagnosis not present

## 2019-07-04 DIAGNOSIS — N184 Chronic kidney disease, stage 4 (severe): Secondary | ICD-10-CM | POA: Diagnosis not present

## 2019-07-04 DIAGNOSIS — Z20828 Contact with and (suspected) exposure to other viral communicable diseases: Secondary | ICD-10-CM | POA: Diagnosis not present

## 2019-07-04 DIAGNOSIS — E1165 Type 2 diabetes mellitus with hyperglycemia: Secondary | ICD-10-CM | POA: Diagnosis not present

## 2019-07-04 LAB — GLUCOSE, CAPILLARY
Glucose-Capillary: 191 mg/dL — ABNORMAL HIGH (ref 70–99)
Glucose-Capillary: 218 mg/dL — ABNORMAL HIGH (ref 70–99)
Glucose-Capillary: 209 mg/dL — ABNORMAL HIGH (ref 70–99)

## 2019-07-04 LAB — BASIC METABOLIC PANEL
Anion gap: 12 (ref 5–15)
BUN: 66 mg/dL — ABNORMAL HIGH (ref 6–20)
CO2: 25 mmol/L (ref 22–32)
Calcium: 8 mg/dL — ABNORMAL LOW (ref 8.9–10.3)
Chloride: 98 mmol/L (ref 98–111)
Creatinine, Ser: 5.27 mg/dL — ABNORMAL HIGH (ref 0.61–1.24)
GFR calc Af Amer: 14 mL/min — ABNORMAL LOW (ref >60)
GFR calc non Af Amer: 12 mL/min — ABNORMAL LOW (ref >60)
Glucose, Bld: 203 mg/dL — ABNORMAL HIGH (ref 70–99)
Potassium: 3.4 mmol/L — ABNORMAL LOW (ref 3.5–5.1)
Sodium: 135 mmol/L (ref 135–145)

## 2019-07-04 LAB — CBC WITH DIFFERENTIAL/PLATELET
Abs Immature Granulocytes: 0.03 10*3/uL (ref 0.00–0.07)
Basophils Absolute: 0.1 10*3/uL (ref 0.0–0.1)
Basophils Relative: 1 %
Eosinophils Absolute: 0.2 10*3/uL (ref 0.0–0.5)
Eosinophils Relative: 2 %
HCT: 35.4 % — ABNORMAL LOW (ref 39.0–52.0)
Hemoglobin: 11.1 g/dL — ABNORMAL LOW (ref 13.0–17.0)
Immature Granulocytes: 0 %
Lymphocytes Relative: 22 %
Lymphs Abs: 1.9 10*3/uL (ref 0.7–4.0)
MCH: 24.5 pg — ABNORMAL LOW (ref 26.0–34.0)
MCHC: 31.4 g/dL (ref 30.0–36.0)
MCV: 78.1 fL — ABNORMAL LOW (ref 80.0–100.0)
Monocytes Absolute: 1.1 10*3/uL — ABNORMAL HIGH (ref 0.1–1.0)
Monocytes Relative: 12 %
Neutro Abs: 5.6 10*3/uL (ref 1.7–7.7)
Neutrophils Relative %: 63 %
Platelets: 314 10*3/uL (ref 150–400)
RBC: 4.53 MIL/uL (ref 4.22–5.81)
RDW: 15.1 % (ref 11.5–15.5)
WBC: 8.8 10*3/uL (ref 4.0–10.5)
nRBC: 0 % (ref 0.0–0.2)

## 2019-07-04 NOTE — Progress Notes (Signed)
Home Health Choices:    [facility] [phone]  [place] my Favorites Quality of Patient Care Rating 4 out of 5 stars Patient Survey Summary Rating 4 out of 5 stars [facility] [phone]  [place] my Favorites Quality of Patient Care Rating 3 out of 5 stars Patient Survey Summary Rating 5 out of [place] [phone]  [place] my Favorites Quality of Patient Care Rating 3 out of 5 stars Patient Survey Summary Rating 4 out of [place] [phone]  [place] my Favorites Quality of Patient Care Rating 3  out of 5 stars Patient Survey Summary Rating 4 out of [place] [phone]) [phone]  Add [facility] my Favorites Quality of Patient Care Rating 4  out of 5 stars Patient Survey Summary Rating 3 out of 5 stars [place] [phone]  [place] my Favorites Quality of Patient Care Rating 4 out of 5 stars Patient Survey Summary Rating 4 out of 5 stars [place] [phone]  Add [facility] my Favorites Quality of Patient Care Rating 4 out of 5 stars Patient Survey Summary Rating 4 out of 5 stars [place] [phone]  [place] my Favorites Quality of Patient Care Rating 4 out of 5 stars Patient Survey Summary Rating 4 out of 5 stars [place] A[place] [phone]  [place] my Favorites Quality of Patient Care Rating 3 out of 5 stars Patient Survey Summary Rating 3 out of 5 stars [facility] [phone]  Add [facility] [place] my Favorites Quality of Patient Care Rating 3  out of 5 stars Patient Survey Summary Rating 4 out of 5 stars [place] [phone]  [place] my Favorites Quality of Patient Care Rating 3 out of 5 stars Patient Survey Summary Rating 4 out of 5 stars [facility] [phone]  Add [facility] my Favorites Quality of Patient Care Rating 3  out of 5 stars Patient Survey Summary Rating 3 out of 5 stars [place] [phone]  Add [facility] my Favorites Quality of Patient Care Rating 3  out of 5 stars [place] [phone]  [place] my Favorites Quality of Patient Care Rating 4  out of 5 stars Patient Survey Summary Rating 3 out of 5 stars Viewing 1 - 14 of 14 results

## 2019-07-04 NOTE — Plan of Care (Signed)
Bed Mobility:   MOD I  Transfers:   MOd I  Gt:  With RW x 50 ft with Mod I

## 2019-07-04 NOTE — Evaluation (Signed)
Physical Therapy Evaluation Patient Details Name: Kelly Key MRN: [id number] DOB: [birth year] Today's Date: 07/04/2019   History of Present Illness  Kelly Key is a 48 y.o. male with medical history significant of obesity (BMI 54); HTN; HLD: DM; stage 4 CKD; and chronic combined CHF (EF 50-55% in 2013) presenting with increased LE edema, decreased ability to ambulate.  He felt better when he left the hospital.  During the week, his legs got weak and he fell once.  He started developing worsening LE edema.  It has been particularly bad for the last 3 days.  He denies SOB.  His weight has "probably" changed.  He can't lie flat because his feet hurt too much and feel like they weigh too much.  No PND.  No fevers.  He does not want to go to rehab.  Clinical Impression  PT does not want legs elevated stating that it makes his Lt knee hurt greater elevated than when it is down. PT refused to ambulate due to increased pain.  PT understands that he will need 24 hour supervision for awhile states he may contact his sister or a friend.     Follow Up Recommendations Home health PT    Equipment Recommendations  3in1 (PT)    Recommendations for Other Services       Precautions / Restrictions Precautions Precautions: Fall Restrictions Weight Bearing Restrictions: No      Mobility  Bed Mobility Overal bed mobility: Modified Independent                Transfers Overall transfer level: Modified independent Equipment used: Rolling walker (2 wheeled)                Ambulation/Gait Ambulation/Gait assistance: Modified independent (Device/Increase time) Gait Distance (Feet): 2 Feet Assistive device: Rolling walker (2 wheeled) Gait Pattern/deviations: Decreased step length - right;Decreased step length - left     General Gait Details: Pain prohibited ambulation only able to transfer.            Pertinent Vitals/Pain Pain Assessment: 0-10 Pain Score: 8  Pain Location: LT  knee Pain Descriptors / Indicators: Tightness Pain Intervention(s): Limited activity within patient's tolerance    Home Living Family/patient expects to be discharged to:: Private residence Living Arrangements: Alone Available Help at Discharge: Friend(s) Type of Home: House Home Access: Level entry     Home Layout: One level Home Equipment: Environmental consultant - 2 wheels      Prior Function Level of Independence: Independent with assistive device(s)                  Extremity/Trunk Assessment        Lower Extremity Assessment Lower Extremity Assessment: Generalized weakness       Communication   Communication: No difficulties  Cognition Arousal/Alertness: Awake/alert Behavior During Therapy: WFL for tasks assessed/performed Overall Cognitive Status: Within Functional Limits for tasks assessed                                           Exercises General Exercises - Lower Extremity Ankle Circles/Pumps: 5 reps;Both;Seated Long Arc Quad: 5 reps;Both;Seated   Assessment/Plan    PT Assessment Patient needs continued PT services  PT Problem List Decreased strength;Decreased range of motion;Decreased activity tolerance;Decreased mobility;Pain       PT Treatment Interventions Gait training;Therapeutic activities    PT Goals (Current goals  can be found in the Care Plan section)  Acute Rehab PT Goals Patient Stated Goal: less pain PT Goal Formulation: With patient Time For Goal Achievement: 07/04/19 Potential to Achieve Goals: Good    Frequency Min 5X/week   Barriers to discharge    Pain        AM-PAC PT "6 Clicks" Mobility  Outcome Measure Help needed turning from your back to your side while in a flat bed without using bedrails?: A Little Help needed moving from lying on your back to sitting on the side of a flat bed without using bedrails?: A Little Help needed moving to and from a bed to a chair (including a wheelchair)?: A Little Help needed  standing up from a chair using your arms (e.g., wheelchair or bedside chair)?: A Little Help needed to walk in hospital room?: A Lot Help needed climbing 3-5 steps with a railing? : A Lot 6 Click Score: 16    End of Session Equipment Utilized During Treatment: Gait belt Activity Tolerance: Patient limited by pain Patient left: in chair;with call bell/phone within reach   PT Visit Diagnosis: Unsteadiness on feet (R26.81);Other abnormalities of gait and mobility (R26.89);Muscle weakness (generalized) (M62.81);History of falling (Z91.81)    Time: 1000-1025 PT Time Calculation (min) (ACUTE ONLY): 25 min   Charges:   PT Evaluation $PT Eval Low Complexity: [place], PT CLT [phone] 07/04/2019, 10:29 AM

## 2019-07-04 NOTE — Progress Notes (Signed)
Patient Demographics:    Kelly Key, is a 48 y.o. male, DOB - 04-29-1971, [id number]  Admit date - 07/03/2019   Admitting Physician Karmen Bongo, MD  Outpatient Primary MD for the patient is Kelly Gravel, MD  LOS - 0   Chief Complaint  Patient presents with  . Leg Swelling        Subjective:    Ludwig Lean today has no fevers, no emesis,  No chest pain,   -Significant other at bedside, dyspnea with minimal exertion persists, --Voiding okay  Assessment  & Plan :    Principal Problem:   Anasarca Active Problems:   HTN (hypertension)   Morbid obesity (HCC)   CKD (chronic kidney disease) stage 5, GFR less than 15 ml/min (HCC)   Uncontrolled type 2 diabetes mellitus with hyperglycemia, with long-term current use of insulin (HCC)   Dyslipidemia   Chronic combined systolic and diastolic CHF (congestive heart failure) (HCC)  Brief Summary 48 y.o. male with medical history significant of obesity (BMI 49); HTN; HLD: DM; stage 4 CKD; and chronic combined CHF (EF 50-55% in 2013) presenting with increased LE edema, decreased ability readmitted on 07/03/2019 with anasarca in the setting of dCHF exacerbation-  A/p 1)Anasarca in the setting of HFpEF--last known EF per echo from 06/23/2019 is 50 to 55%, -patient with history of chronic low EF CHF--- presenting with acute on chronic preserved EF CHF exacerbation, -PTA patient was on torsemide 60 mg twice daily - continue IV diuresis with IV Lasix 60 mg every 6 hours  -  2)HTN--- work-up for possible renal artery stenosis including MRI of renal arteries was discussed  By Dr Oneida Alar,  -He is scheduled for an MRA of his abdomen on 9/18 for evaluation of RAS, but this may be less meaningful if the patient is transitioning to HD -Stable, continue Coreg 25 mg twice daily, clonidine 0.3 mg 3 times daily,  3)CKD IV--renal function continues to worsen, progressive CKD,  -Prior renal ultrasound without obstructive uropathy -Nephrology service discussed option of dialysis with patient to better address his volume status -Continue sodium bicarbonate 650 twice daily  4)Anemia of CKD----stable, hemoglobin 11.1  5)DM2-last A1c 12.4, okay to continue Levemir 35 units twice daily, along with sliding scale coverage  Disposition/Need for in-Hospital Stay- patient unable to be discharged at this time due to --anasarca requiring aggressive IV diuresis*  Code Status : FULL  Family Communication:   NA (patient is alert, awake and coherent)   Disposition Plan  : TBD  Consults  :  Nephrology  DVT Prophylaxis  :   - Heparin - SCDs  Lab Results  Component Value Date   PLT 314 07/04/2019    Inpatient Medications  Scheduled Meds: . aspirin EC  81 mg Oral Daily  . atorvastatin  80 mg Oral Daily  . carvedilol  25 mg Oral BID WC  . cloNIDine  0.3 mg Oral TID  . ezetimibe  10 mg Oral QPM  . fluticasone  1 spray Each Nare Daily  . furosemide  60 mg Intravenous Q6H  . gabapentin  100 mg Oral TID  . heparin  5,000 Units Subcutaneous Q8H  . insulin aspart  0-20 Units Subcutaneous TID WC  . insulin aspart  0-5 Units  Subcutaneous QHS  . insulin detemir  35 Units Subcutaneous BID  . linaclotide  145 mcg Oral QAC breakfast  . sodium bicarbonate  650 mg Oral BID  . sodium chloride flush  3 mL Intravenous Q12H   Continuous Infusions: . sodium chloride     PRN Meds:.sodium chloride, acetaminophen, albuterol, calcium carbonate (dosed in mg elemental calcium), camphor-menthol **AND** hydrOXYzine, docusate sodium, feeding supplement (NEPRO CARB STEADY), HYDROcodone-acetaminophen, ondansetron (ZOFRAN) IV, sodium chloride flush, sorbitol, zolpidem    Anti-infectives (From admission, onward)   None        Objective:   Vitals:   07/03/19 1955 07/03/19 2059 07/04/19 0500 07/04/19 0551  BP:  (!) 159/93  (!) 137/107  Pulse: 72 (!) 104  99  Resp: 16 19  20   Temp:   98.6 F (37 C)  97.8 F (36.6 C)  TempSrc:  Oral  Oral  SpO2: 94% 97%  94%  Weight:   (!) 142.4 kg   Height:        Wt Readings from Last 3 Encounters:  07/04/19 (!) 142.4 kg  06/27/19 (!) 141.6 kg  06/16/19 (!) 138.8 kg     Intake/Output Summary (Last 24 hours) at 07/04/2019 1838 Last data filed at 07/04/2019 1700 Gross per 24 hour  Intake 720 ml  Output 2400 ml  Net -1680 ml     Physical Exam  Gen:- Awake Alert, morbidly obese,  HEENT:- [place].AT, No sclera icterus Neck-Supple Neck,No JVD,.  Breast-gynecomastia Lungs-diminished in bases with bibasilar rales  CV- S1, S2 normal, regular  Abd-  +ve B.Sounds, Abd Soft, increased truncal adiposity extremity/Skin: 3 + pitting edema, pedal pulses present  Psych-affect is appropriate, oriented x3 Neuro-generalized weakness, no new focal deficits, no tremors   Data Review:   Micro Results Recent Results (from the past 240 hour(s))  SARS Coronavirus 2 [facility] order, Performed in [facility] hospital lab) Nasopharyngeal Nasopharyngeal Swab     Status: None   Collection Time: 07/03/19  4:08 PM   Specimen: Nasopharyngeal Swab  Result Value Ref Range Status   SARS Coronavirus 2 NEGATIVE NEGATIVE Final    Comment: (NOTE) If result is NEGATIVE SARS-CoV-2 target nucleic acids are NOT DETECTED. The SARS-CoV-2 RNA is generally detectable in upper and lower  respiratory specimens during the acute phase of infection. The lowest  concentration of SARS-CoV-2 viral copies this assay can detect is 250  copies / mL. A negative result does not preclude SARS-CoV-2 infection  and should not be used as the sole basis for treatment or other  patient management decisions.  A negative result may occur with  improper specimen collection / handling, submission of specimen other  than nasopharyngeal swab, presence of viral mutation(s) within the  areas targeted by this assay, and inadequate number of viral copies  (<250 copies / mL). A negative  result must be combined with clinical  observations, patient history, and epidemiological information. If result is POSITIVE SARS-CoV-2 target nucleic acids are DETECTED. The SARS-CoV-2 RNA is generally detectable in upper and lower  respiratory specimens dur ing the acute phase of infection.  Positive  results are indicative of active infection with SARS-CoV-2.  Clinical  correlation with patient history and other diagnostic information is  necessary to determine patient infection status.  Positive results do  not rule out bacterial infection or co-infection with other viruses. If result is PRESUMPTIVE POSTIVE SARS-CoV-2 nucleic acids MAY BE PRESENT.   A presumptive positive result was obtained on the submitted specimen  and confirmed  on repeat testing.  While 2019 novel coronavirus  (SARS-CoV-2) nucleic acids may be present in the submitted sample  additional confirmatory testing may be necessary for epidemiological  and / or clinical management purposes  to differentiate between  SARS-CoV-2 and other Sarbecovirus currently known to infect humans.  If clinically indicated additional testing with an alternate test  methodology [phone]) is advised. The SARS-CoV-2 RNA is generally  detectable in upper and lower respiratory sp ecimens during the acute  phase of infection. The expected result is Negative. Fact Sheet for Patients:  StrictlyIdeas.no Fact Sheet for Healthcare Providers: BankingDealers.co.za This test is not yet approved or cleared by the [place] FDA and has been authorized for detection and/or diagnosis of SARS-CoV-2 by FDA under an Emergency Use Authorization (EUA).  This EUA will remain in effect (meaning this test can be used) for the duration of the COVID-19 declaration under Section 564(b)(1) of the Act, 21 U.S.C. section 360bbb-3(b)(1), unless the authorization is terminated or revoked sooner. Performed at [facility], [address] [place], [place] [address]     Radiology Reports Dg Chest 2 View  Result Date: 07/03/2019 CLINICAL DATA:  History of end-stage renal disease and CHF. EXAM: CHEST - 2 VIEW COMPARISON:  Chest radiograph 06/22/2019, 04/28/2019 FINDINGS: Stable cardiomediastinal contours with enlarged heart size. A few minimal linear opacities at the right lung base likely reflect atelectasis or scar. No new focal infiltrate or evidence of edema. No pneumothorax or pleural effusion. No acute findings in the visualized skeleton. IMPRESSION: No evidence of active disease. Electronically Signed   By: Audie Pinto M.D.   On: 07/03/2019 13:57   US Renal  Result Date: 06/23/2019 CLINICAL DATA:  Acute renal failure EXAM: RENAL / URINARY TRACT ULTRASOUND COMPLETE COMPARISON:  None. FINDINGS: Right Kidney: Renal measurements: 11.7 x 4.8 x 6.1 cm = volume: 177 mL . Echogenicity within normal limits. No mass or hydronephrosis visualized. Left Kidney: Renal measurements: 11.1 x 5.3 x 5.9 cm = volume: 181 mL. Echogenicity within normal limits. No mass or hydronephrosis visualized. Bladder: Appears normal for degree of bladder distention. IMPRESSION: Normal bilateral renal ultrasound. Electronically Signed   By: Kathreen Devoid   On: 06/23/2019 11:46   US Venous Img Lower Bilateral  Result Date: 06/22/2019 CLINICAL DATA:  Bilateral lower extremity pain and edema. Evaluate for DVT. EXAM: BILATERAL LOWER EXTREMITY VENOUS DOPPLER ULTRASOUND TECHNIQUE: Gray-scale sonography with graded compression, as well as color Doppler and duplex ultrasound were performed to evaluate the lower extremity deep venous systems from the level of the common femoral vein and including the common femoral, femoral, profunda femoral, popliteal and calf veins including the posterior tibial, peroneal and gastrocnemius veins when visible. The superficial great saphenous vein was also interrogated. Spectral Doppler was utilized to evaluate  flow at rest and with distal augmentation maneuvers in the common femoral, femoral and popliteal veins. COMPARISON:  None. FINDINGS: RIGHT LOWER EXTREMITY Common Femoral Vein: No evidence of thrombus. Normal compressibility, respiratory phasicity and response to augmentation. Saphenofemoral Junction: No evidence of thrombus. Normal compressibility and flow on color Doppler imaging. Profunda Femoral Vein: No evidence of thrombus. Normal compressibility and flow on color Doppler imaging. Femoral Vein: No evidence of thrombus. Normal compressibility, respiratory phasicity and response to augmentation. Popliteal Vein: No evidence of thrombus. Normal compressibility, respiratory phasicity and response to augmentation. Calf Veins: No evidence of thrombus. Normal compressibility and flow on color Doppler imaging. Superficial Great Saphenous Vein: No evidence of thrombus. Normal compressibility. Venous Reflux:  None. Other Findings:  None. LEFT LOWER EXTREMITY Common Femoral Vein: No evidence of thrombus. Normal compressibility, respiratory phasicity and response to augmentation. Saphenofemoral Junction: No evidence of thrombus. Normal compressibility and flow on color Doppler imaging. Profunda Femoral Vein: No evidence of thrombus. Normal compressibility and flow on color Doppler imaging. Femoral Vein: No evidence of thrombus. Normal compressibility, respiratory phasicity and response to augmentation. Popliteal Vein: No evidence of thrombus. Normal compressibility, respiratory phasicity and response to augmentation. Calf Veins: No evidence of thrombus. Normal compressibility and flow on color Doppler imaging. Superficial Great Saphenous Vein: No evidence of thrombus. Normal compressibility. Venous Reflux:  None. Other Findings:  None. IMPRESSION: No evidence of DVT within either lower extremity. Electronically Signed   By: Sandi Mariscal M.D.   On: 06/22/2019 14:52   Dg Chest Port 1 View  Result Date: 06/22/2019 CLINICAL  DATA:  Pt c/o swelling in lower extremities x several days. Hx CHF, obesity, diabetes, nonsmoker EXAM: PORTABLE CHEST 1 VIEW COMPARISON:  Chest radiographs dated 04/28/2019, 02/23/2019 FINDINGS: Stable cardiomediastinal contours with enlarged heart size. Central vascular congestion without overt edema. No pneumothorax or pleural effusion. Visualized skeleton is unremarkable. IMPRESSION: Cardiomegaly and central vascular congestion without overt edema. Electronically Signed   By: Audie Pinto M.D.   On: 06/22/2019 10:53   Vas US Renal Artery Duplex  Result Date: 06/16/2019 ABDOMINAL VISCERAL High Risk Factors: Hypertension, hyperlipidemia, Diabetes, no history of                    smoking. Other Factors: Limited study secondary to obesity and bowel gas. Limitations: Air/bowel gas and obesity. Performing Technologist: Delorise Shiner RVT  Examination Guidelines: A complete evaluation includes B-mode imaging, spectral Doppler, color Doppler, and power Doppler as needed of all accessible portions of each vessel. Bilateral testing is considered an integral part of a complete examination. Limited examinations for reoccurring indications may be performed as noted.  Duplex Findings: +----------+--------+--------+------+--------+ MesentericPSV cm/sEDV cm/sPlaqueComments +----------+--------+--------+------+--------+ Aorta Mid    58                          +----------+--------+--------+------+--------+  +------------------+--------+--------+-------+ Right Renal ArteryPSV cm/sEDV cm/sComment +------------------+--------+--------+-------+ Proximal             25                   +------------------+--------+--------+-------+ Mid                  26                   +------------------+--------+--------+-------+ Distal               27                   +------------------+--------+--------+-------+ +-----------------+--------+--------+-------+ Left Renal ArteryPSV cm/sEDV  cm/sComment +-----------------+--------+--------+-------+ Origin              40                   +-----------------+--------+--------+-------+ Proximal            29                   +-----------------+--------+--------+-------+ Mid                 27                   +-----------------+--------+--------+-------+ Distal  23                   +-----------------+--------+--------+-------+ +------------+--------+--------+--+-----------+--------+--------+---+ Right KidneyPSV cm/sEDV cm/sRILeft KidneyPSV cm/sEDV cm/sRI  +------------+--------+--------+--+-----------+--------+--------+---+ Upper Pole                    Upper Pole                     +------------+--------+--------+--+-----------+--------+--------+---+ Mid                           Mid                            +------------+--------+--------+--+-----------+--------+--------+---+ Lower Pole                    Lower Pole                     +------------+--------+--------+--+-----------+--------+--------+---+ Hilar                         Hilar                          +------------+--------+--------+--+-----------+--------+--------+---+ +------------------+-------+------------------+-------+ Right Kidney             Left Kidney               +------------------+-------+------------------+-------+ RAR                      RAR                       +------------------+-------+------------------+-------+ RAR (manual)             RAR (manual)              +------------------+-------+------------------+-------+ Cortex                   Cortex                    +------------------+-------+------------------+-------+ Cortex thickness  1.88 mmCorex thickness   1.84 mm +------------------+-------+------------------+-------+ Kidney length (cm)13.27  Kidney length (cm)10.24   +------------------+-------+------------------+-------+  Summary: Renal:   Right: Normal size right kidney. No evidence of right renal artery        stenosis. RRV flow present. Limited study secondary to body        habitus. Could not insonnate right renal artery origin. Left:  Normal size of left kidney. No evidence of left renal artery        stenosis. LRV flow present. Limited study secondary to body        habitus.  *See table(s) above for measurements and observations.  Diagnosing physician: Ruta Hinds MD  Electronically signed by Ruta Hinds MD on 06/16/2019 at 10:43:24 AM.    Final      CBC Recent Labs  Lab 07/03/19 1318 07/04/19 0449  WBC 10.9* 8.8  HGB 11.6* 11.1*  HCT 36.7* 35.4*  PLT 334 314  MCV 77.4* 78.1*  MCH 24.5* 24.5*  MCHC 31.6 31.4  RDW 15.3 15.1  LYMPHSABS  --  1.9  MONOABS  --  1.1*  EOSABS  --  0.2  BASOSABS  --  0.1    Chemistries  Recent Labs  Lab 07/03/19 1318 07/04/19 0449  NA 133* 135  K 3.5 3.4*  CL 97* 98  CO2 22 25  GLUCOSE 243* 203*  BUN 65* 66*  CREATININE 5.15* 5.27*  CALCIUM 8.0* 8.0*   ------------------------------------------------------------------------------------------------------------------ No results for input(s): CHOL, HDL, LDLCALC, TRIG, CHOLHDL, LDLDIRECT in the last 72 hours.  Lab Results  Component Value Date   HGBA1C 12.1 (H) 06/23/2019   ------------------------------------------------------------------------------------------------------------------ No results for input(s): TSH, T4TOTAL, T3FREE, THYROIDAB in the last 72 hours.  Invalid input(s): FREET3 ------------------------------------------------------------------------------------------------------------------ No results for input(s): VITAMINB12, FOLATE, FERRITIN, TIBC, IRON, RETICCTPCT in the last 72 hours.  Coagulation profile Recent Labs  Lab 07/03/19 1318  INR 1.1    No results for input(s): DDIMER in the last 72 hours.  Cardiac Enzymes No results for input(s): CKMB, TROPONINI, MYOGLOBIN in the last 168 hours.   Invalid input(s): CK ------------------------------------------------------------------------------------------------------------------    Component Value Date/Time   BNP 119.0 (H) 06/22/2019 1053     Roxan Hockey M.D on 07/04/2019 at 6:38 PM  Go to [email or web address] - for contact info  [facility] - Office  [phone]

## 2019-07-04 NOTE — Consult Note (Signed)
Reason for Consult: Renal failure Referring Physician:  Dr. Lorin Mercy  Chief Complaint:    Assessment/Plant: 1. AKI/CKD in setting of decompensated diastolic CHF and escalating diuretic dosage. BL creatinine appears to be in the 3.6-4 range with e/o continued progression since mid 2019. No evidence of uremia and pt resistant to considering dialysis at this time; I again spent an extensive amount of time educating the patient. He is not sure he wants to start dialysis if it became necessary but is also not refusing it; he wants to think about it overnight but I warned him that he will need to make a decision as he's headed towards dialysis. IV diuresis is working but as soon as he goes home it's unlikely oral diuretics will be adequate.  - Recent renal u/s neg for hydro (06/23/19).  - Strict I&O's with floor weights please. Pt states that he is urinating a lot and I&O support this.  - Continue with bp control, diuresis, and will continue to follow. We will discuss initiation of HD again tomorrow; it's very difficult to get him to make a decision.  2. Anasarca/acute on chronic diastolic CHF-  Started on IV lasix with decent response. [name] His urine prot/creat ratio is markedly elevated at 6.3 mg/g, however albumin is only mildly decreased at 3.1 (consistent with DN and obesity related FSGS). I would continue with IV lasix and follow I's/O's, daily weights -> he is massively overloaded.  3. Malignant HTN- poorly controlled with unrevealing secondary HTN workup but pending MRA of renal arteries. He was seen by [name] who ordered test, will need to see if Radiology can do a non-contrasted MRA of renal arteries with good image quality.  1. I'm not convinced that therapy of possible RAS is going to stave off HD. I explained to the pt that he likely has very little renal reserve and PTA with contrast if he has significant RAS will almost certainly push him into AKI + HD. 2. Also should improve with  diuresis. 4. CKD stage 4- has been progressive due to poorly controlled HTN and DM. Not sure kidney biopsy would be helpful given chronicity and severity of CKD at this point. I spent time discussing the severity of his CKD as well as stage. We also discussed HD, home HD, PD, and renal transplantation. Will have him watch educational video tapes regarding dialysis. We also discussed the ways to delay the progression of CKD with  1. Tight BP control (goal BP <130/80) 2. Tight glucose control (goal Hgb A1c of <7%) 3. Weight loss (especially for BP, DM, and obesity related FSGS) 4. Avoidance of nephrotoxic agents such as NSAIDs, COX-II I's, IV contrast 5. Use of ACE/ARB, however given ongoing aggressive diuresis, AKI/CKD, and his unwillingness to consider HD at this time, risks outweigh benefits. 6. Continue with education and encouragement; I spent a considerable amt of time educating today but most likely he will need continued education. From what he told me today most likely he will agree to dialysis when it is indicated.  5. DM- poorly controlled with Hgb A1c of 12.4%. Changes to regimen per primary svc. 6. Hypokalemia- replete and follow 7. Anemia of CKD- stable 8. Obesity- consider dietary consult to help with DM control and weight loss 9. Hyperuricemia- consider allopurinol or uloric.  HPI: Kelly Key is an 48 y.o. male with  a PMH significant for dCHF, DM, HTN, HLD, morbid obesity, fatty liver disease, and CKD stage 4 followed by Dr. Lowanda Foster with nephrotic  range proteinuria (documented in past as >11 grams/24 hours) who was recently hospitalized on 06/22/2019 at [facility] with a 2 week history of worsening lower extremity edema and abdominal girth not responsive to increasing doses of torsemide as an outpatient.  He has had accelerated HTN and workup for renal artery stenosis was underway with [name] however that has not been scheduled.  During that hospitalization there were extensive  discussions with the pt educating him about the need to initiate dialysis but he refused wanting to give a trial with outpt diuretics.  In the past Kelly Key had expressed that he would "rather die than go on dialysis" and is followed by Dr. Lowanda Foster, however on further discussion he always states that he's not ready to die. Patient went home with torsemide 60mg  BID and unfortunately did not respond even though he was compliant. His leg swelling became worse with difficult ambulation and dyspnea as well. He denies NSAID use, nausea, CP or fevers.   ROS Pertinent items are noted in HPI.  Chemistry and CBC: Creatinine, Ser  Date/Time Value Ref Range Status  07/04/2019 04:49 AM 5.27 (H) 0.61 - 1.24 mg/dL Final  07/03/2019 01:18 PM 5.15 (H) 0.61 - 1.24 mg/dL Final  06/27/2019 04:41 AM 5.18 (H) 0.61 - 1.24 mg/dL Final  06/26/2019 06:18 AM 5.23 (H) 0.61 - 1.24 mg/dL Final  06/25/2019 06:18 AM 5.27 (H) 0.61 - 1.24 mg/dL Final  06/24/2019 07:26 AM 5.14 (H) 0.61 - 1.24 mg/dL Final  06/23/2019 06:25 AM 4.93 (H) 0.61 - 1.24 mg/dL Final  06/22/2019 10:52 AM 4.87 (H) 0.61 - 1.24 mg/dL Final  05/05/2019 01:14 AM 5.28 (H) 0.61 - 1.24 mg/dL Final  04/28/2019 09:33 AM 4.93 (H) 0.61 - 1.24 mg/dL Final  02/23/2019 04:20 AM 3.52 (H) 0.61 - 1.24 mg/dL Final  02/22/2019 05:06 AM 3.65 (H) 0.61 - 1.24 mg/dL Final  02/21/2019 04:53 AM 3.59 (H) 0.61 - 1.24 mg/dL Final  02/20/2019 09:00 AM 3.98 (H) 0.61 - 1.24 mg/dL Final  04/04/2018 06:28 AM 2.30 (H) 0.61 - 1.24 mg/dL Final  04/03/2018 07:06 AM 2.21 (H) 0.61 - 1.24 mg/dL Final  04/02/2018 10:00 AM 2.32 (H) 0.61 - 1.24 mg/dL Final  04/01/2018 10:38 AM 2.44 (H) 0.61 - 1.24 mg/dL Final  03/31/2018 04:51 PM 2.65 (H) 0.61 - 1.24 mg/dL Final  03/30/2018 06:36 AM 2.74 (H) 0.61 - 1.24 mg/dL Final  03/29/2018 09:36 AM 2.66 (H) 0.61 - 1.24 mg/dL Final  03/28/2018 12:52 PM 2.46 (H) 0.61 - 1.24 mg/dL Final  03/27/2018 07:07 AM 2.28 (H) 0.61 - 1.24 mg/dL Final  03/26/2018  06:47 AM 2.24 (H) 0.61 - 1.24 mg/dL Final  06/12/2012 06:57 AM 1.18 0.50 - 1.35 mg/dL Final  06/11/2012 04:56 AM 1.31 0.50 - 1.35 mg/dL Final   Recent Labs  Lab 07/03/19 1318 07/04/19 0449  NA 133* 135  K 3.5 3.4*  CL 97* 98  CO2 22 25  GLUCOSE 243* 203*  BUN 65* 66*  CREATININE 5.15* 5.27*  CALCIUM 8.0* 8.0*   Recent Labs  Lab 07/03/19 1318 07/04/19 0449  WBC 10.9* 8.8  NEUTROABS  --  5.6  HGB 11.6* 11.1*  HCT 36.7* 35.4*  MCV 77.4* 78.1*  PLT 334 314   Liver Function Tests: No results for input(s): AST, ALT, ALKPHOS, BILITOT, PROT, ALBUMIN in the last 168 hours. No results for input(s): LIPASE, AMYLASE in the last 168 hours. No results for input(s): AMMONIA in the last 168 hours. Cardiac Enzymes: No results for input(s): CKTOTAL, CKMB,  CKMBINDEX, TROPONINI in the last 168 hours. Iron Studies: No results for input(s): IRON, TIBC, TRANSFERRIN, FERRITIN in the last 72 hours. PT/INR: @LABRCNTIP (inr:5)  Xrays/Other Studies: ) Results for orders placed or performed during the hospital encounter of 07/03/19 (from the past 48 hour(s))  Basic metabolic panel     Status: Abnormal   Collection Time: 07/03/19  1:18 PM  Result Value Ref Range   Sodium 133 (L) 135 - 145 mmol/L   Potassium 3.5 3.5 - 5.1 mmol/L   Chloride 97 (L) 98 - 111 mmol/L   CO2 22 22 - 32 mmol/L   Glucose, Bld 243 (H) 70 - 99 mg/dL   BUN 65 (H) 6 - 20 mg/dL   Creatinine, Ser 5.15 (H) 0.61 - 1.24 mg/dL   Calcium 8.0 (L) 8.9 - 10.3 mg/dL   GFR calc non Af Amer 12 (L) >60 mL/min   GFR calc Af Amer 14 (L) >60 mL/min   Anion gap 14 5 - 15    Comment: Performed at [facility], [address], [place], [place] [address]  CBC     Status: Abnormal   Collection Time: 07/03/19  1:18 PM  Result Value Ref Range   WBC 10.9 (H) 4.0 - 10.5 K/uL   RBC 4.74 4.22 - 5.81 MIL/uL   Hemoglobin 11.6 (L) 13.0 - 17.0 g/dL   HCT 36.7 (L) 39.0 - 52.0 %   MCV 77.4 (L) 80.0 - 100.0 fL   MCH 24.5 (L) 26.0 - 34.0 pg   MCHC  31.6 30.0 - 36.0 g/dL   RDW 15.3 11.5 - 15.5 %   Platelets 334 150 - 400 K/uL   nRBC 0.0 0.0 - 0.2 %    Comment: Performed at [facility], [address], [place], [place] [address]  Protime-INR (order if Patient is taking Coumadin / Warfarin)     Status: None   Collection Time: 07/03/19  1:18 PM  Result Value Ref Range   Prothrombin Time 14.3 11.4 - 15.2 seconds   INR 1.1 0.8 - 1.2    Comment: (NOTE) INR goal varies based on device and disease states. Performed at [facility], [address] [place], [place] [address]   SARS Coronavirus 2 [facility] order, Performed in [facility] hospital lab) Nasopharyngeal Nasopharyngeal Swab     Status: None   Collection Time: 07/03/19  4:08 PM   Specimen: Nasopharyngeal Swab  Result Value Ref Range   SARS Coronavirus 2 NEGATIVE NEGATIVE    Comment: (NOTE) If result is NEGATIVE SARS-CoV-2 target nucleic acids are NOT DETECTED. The SARS-CoV-2 RNA is generally detectable in upper and lower  respiratory specimens during the acute phase of infection. The lowest  concentration of SARS-CoV-2 viral copies this assay can detect is 250  copies / mL. A negative result does not preclude SARS-CoV-2 infection  and should not be used as the sole basis for treatment or other  patient management decisions.  A negative result may occur with  improper specimen collection / handling, submission of specimen other  than nasopharyngeal swab, presence of viral mutation(s) within the  areas targeted by this assay, and inadequate number of viral copies  (<250 copies / mL). A negative result must be combined with clinical  observations, patient history, and epidemiological information. If result is POSITIVE SARS-CoV-2 target nucleic acids are DETECTED. The SARS-CoV-2 RNA is generally detectable in upper and lower  respiratory specimens dur ing the acute phase of infection.  Positive  results are indicative of active infection  with SARS-CoV-2.  Clinical  correlation  with patient history and other diagnostic information is  necessary to determine patient infection status.  Positive results do  not rule out bacterial infection or co-infection with other viruses. If result is PRESUMPTIVE POSTIVE SARS-CoV-2 nucleic acids MAY BE PRESENT.   A presumptive positive result was obtained on the submitted specimen  and confirmed on repeat testing.  While 2019 novel coronavirus  (SARS-CoV-2) nucleic acids may be present in the submitted sample  additional confirmatory testing may be necessary for epidemiological  and / or clinical management purposes  to differentiate between  SARS-CoV-2 and other Sarbecovirus currently known to infect humans.  If clinically indicated additional testing with an alternate test  methodology [phone]) is advised. The SARS-CoV-2 RNA is generally  detectable in upper and lower respiratory sp ecimens during the acute  phase of infection. The expected result is Negative. Fact Sheet for Patients:  StrictlyIdeas.no Fact Sheet for Healthcare Providers: BankingDealers.co.za This test is not yet approved or cleared by the [place] FDA and has been authorized for detection and/or diagnosis of SARS-CoV-2 by FDA under an Emergency Use Authorization (EUA).  This EUA will remain in effect (meaning this test can be used) for the duration of the COVID-19 declaration under Section 564(b)(1) of the Act, 21 U.S.C. section 360bbb-3(b)(1), unless the authorization is terminated or revoked sooner. Performed at [facility], [address], [place], [place] [address]   Glucose, capillary     Status: Abnormal   Collection Time: 07/03/19  8:58 PM  Result Value Ref Range   Glucose-Capillary 290 (H) 70 - 99 mg/dL   Comment 1 Notify RN    Comment 2 Document in Chart   CBC WITH DIFFERENTIAL     Status: Abnormal   Collection Time: 07/04/19  4:49 AM  Result Value Ref Range   WBC 8.8 4.0 - 10.5 K/uL    RBC 4.53 4.22 - 5.81 MIL/uL   Hemoglobin 11.1 (L) 13.0 - 17.0 g/dL   HCT 35.4 (L) 39.0 - 52.0 %   MCV 78.1 (L) 80.0 - 100.0 fL   MCH 24.5 (L) 26.0 - 34.0 pg   MCHC 31.4 30.0 - 36.0 g/dL   RDW 15.1 11.5 - 15.5 %   Platelets 314 150 - 400 K/uL   nRBC 0.0 0.0 - 0.2 %   Neutrophils Relative % 63 %   Neutro Abs 5.6 1.7 - 7.7 K/uL   Lymphocytes Relative 22 %   Lymphs Abs 1.9 0.7 - 4.0 K/uL   Monocytes Relative 12 %   Monocytes Absolute 1.1 (H) 0.1 - 1.0 K/uL   Eosinophils Relative 2 %   Eosinophils Absolute 0.2 0.0 - 0.5 K/uL   Basophils Relative 1 %   Basophils Absolute 0.1 0.0 - 0.1 K/uL   Immature Granulocytes 0 %   Abs Immature Granulocytes 0.03 0.00 - 0.07 K/uL    Comment: Performed at [facility], [address] [place], [place] [id number]  Basic metabolic panel     Status: Abnormal   Collection Time: 07/04/19  4:49 AM  Result Value Ref Range   Sodium 135 135 - 145 mmol/L   Potassium 3.4 (L) 3.5 - 5.1 mmol/L   Chloride 98 98 - 111 mmol/L   CO2 25 22 - 32 mmol/L   Glucose, Bld 203 (H) 70 - 99 mg/dL   BUN 66 (H) 6 - 20 mg/dL   Creatinine, Ser 5.27 (H) 0.61 - 1.24 mg/dL   Calcium 8.0 (L) 8.9 -  10.3 mg/dL   GFR calc non Af Amer 12 (L) >60 mL/min   GFR calc Af Amer 14 (L) >60 mL/min   Anion gap 12 5 - 15    Comment: Performed at [facility], [address], [place], [place] [address]  Glucose, capillary     Status: Abnormal   Collection Time: 07/04/19  7:38 AM  Result Value Ref Range   Glucose-Capillary 191 (H) 70 - 99 mg/dL  Glucose, capillary     Status: Abnormal   Collection Time: 07/04/19 11:19 AM  Result Value Ref Range   Glucose-Capillary 218 (H) 70 - 99 mg/dL   Dg Chest 2 View  Result Date: 07/03/2019 CLINICAL DATA:  History of end-stage renal disease and CHF. EXAM: CHEST - 2 VIEW COMPARISON:  Chest radiograph 06/22/2019, 04/28/2019 FINDINGS: Stable cardiomediastinal contours with enlarged heart size. A few minimal linear opacities at the right lung base likely  reflect atelectasis or scar. No new focal infiltrate or evidence of edema. No pneumothorax or pleural effusion. No acute findings in the visualized skeleton. IMPRESSION: No evidence of active disease. Electronically Signed   By: Audie Pinto M.D.   On: 07/03/2019 13:57    PMH:   Past Medical History:  Diagnosis Date  . Acute diastolic CHF (congestive heart failure) ([place]) 06/11/2012   EF 50-55% [facility])  . Diabetes mellitus    A1c 11.5 (06/11/2012).  . Gout   . Hepatic steatosis 06/11/2012   Elevated LFTs  . Hyperlipemia   . Malignant hypertension   . Microcytic anemia 06/12/2012  . Obesity     PSH:   Past Surgical History:  Procedure Laterality Date  . NO PAST SURGERIES      Allergies:  Allergies  Allergen Reactions  . Hydralazine   . Lisinopril Swelling    Medications:   Prior to Admission medications   Medication Sig Start Date End Date Taking? Authorizing Provider  albuterol (VENTOLIN HFA) 108 (90 Base) MCG/ACT inhaler Inhale 2 puffs into the lungs every 4 (four) hours as needed. 03/24/19  Yes [provider]  amLODipine (NORVASC) 10 MG tablet Take 10 mg by mouth daily.   Yes [provider]  atorvastatin (LIPITOR) 80 MG tablet Take 1 tablet by mouth daily. 06/06/19  Yes [provider]  carvedilol (COREG) 25 MG tablet Take 1 tablet (25 mg total) by mouth 2 (two) times daily with a meal. 06/27/19  Yes Tat, David, MD  cloNIDine (CATAPRES) 0.3 MG tablet Take 1 tablet (0.3 mg total) by mouth 3 (three) times daily. 04/04/18 07/03/19 Yes Shah, Pratik D, DO  ergocalciferol (VITAMIN D2) 1.25 MG (50000 UT) capsule Take 50,000 Units by mouth once a week.   Yes [provider]  ezetimibe (ZETIA) 10 MG tablet Take 10 mg by mouth every evening. 01/20/19  Yes [provider]  fluticasone (FLONASE) 50 MCG/ACT nasal spray Place 1 spray into both nostrils daily. 04/04/18 07/03/19 Yes Shah, Pratik D, DO  gabapentin (NEURONTIN) 100 MG capsule  Take 1 capsule by mouth 3 (three) times daily. 05/12/19  Yes [provider]  insulin aspart (NOVOLOG) 100 UNIT/ML injection Inject 5-15 Units into the skin 3 (three) times daily before meals.    Yes [provider]  insulin detemir (LEVEMIR) 100 UNIT/ML injection Inject 0.35 mLs (35 Units total) into the skin 2 (two) times daily. 06/27/19  Yes Tat, Shanon Brow, MD  Insulin Pen Needle 31G X 5 MM MISC Use as directed. 04/04/18  Yes Manuella Ghazi, Pratik D, DO  linaclotide (LINZESS) 145 MCG CAPS capsule Take 1 capsule (145 mcg total) by mouth daily before breakfast. 04/05/18  Yes Manuella Ghazi, Pratik D, DO  potassium chloride SA (K-DUR) 20 MEQ tablet Take 1 tablet (20 mEq total) by mouth daily. 02/28/19  Yes Johnson, Clanford L, MD  sodium bicarbonate 650 MG tablet Take 650 mg by mouth 2 (two) times daily.   Yes [provider]  torsemide (DEMADEX) 20 MG tablet Take 3 tablets (60 mg total) by mouth 2 (two) times daily. 06/27/19  Yes TatShanon Brow, MD    Discontinued Meds:  There are no discontinued medications.  Social History:  reports that he has never smoked. He has never used smokeless tobacco. He reports that he does not drink alcohol or use drugs.  Family History:   Family History  Problem Relation Age of Onset  . Gout Mother   . Asthma Mother   . Diabetes Father   . Heart failure Father   . Diabetes Sister   . Hypertension Brother   . Pancreatic cancer Brother   . Diabetes Sister     Blood pressure (!) 137/107, pulse 99, temperature 97.8 F (36.6 C), temperature source Oral, resp. rate 20, height 5\' 7"  (1.702 m), weight (!) 142.4 kg, SpO2 94 %. General appearance: alert, cooperative and appears stated age Head: Normocephalic, without obvious abnormality, atraumatic Eyes: negative Back: symmetric, no curvature. ROM normal. No CVA tenderness. Resp: rales bibasilar and bilaterally Chest wall: no tenderness Cardio: regular rate and rhythm GI: Obese Extremities: edema 3+ b/l Pulses: 2+  and symmetric Skin: Skin color, texture, turgor normal. No rashes or lesions Lymph nodes: Cervical adenopathy: None       Dwana Melena, MD 07/04/2019, 2:34 PM

## 2019-07-04 NOTE — TOC Initial Note (Signed)
Transition of Care [facility]) - Initial/Assessment Note    Patient Details  Name: KALMEN MORGRET MRN: [id number] Date of Birth: 1970-11-14  [facility] CM/SW Contact:    Boneta Lucks, RN Phone Number: 07/04/2019, 3:37 PM  Clinical Narrative:  Patient in observation after recent discharge, admitted with Anasarca. Patient lives at home with his girlfriend, continues to drive as needed. Last visit patient was referred to [facility], now active for education on dialysis. PT recommended Home Health PT.  Patient has used [facility] in the past and wanted to continue with their services. Call Santiago Glad with [facility] she was able to accept the referral for HHPT to being on Wed if needed. TOC will follow.                  Expected Discharge Plan: [place] Barriers to Discharge: Continued Medical Work up   Patient Goals and CMS Choice Patient states their goals for this hospitalization and ongoing recovery are:: to return home. CMS Medicare.gov Compare Post Acute Care list provided to:: Patient Choice offered to / list presented to : Patient  Expected Discharge Plan and Services Expected Discharge Plan: [place]       Living arrangements for the past 2 months: Single Family Home Expected Discharge Date: 07/05/19                   Prior Living Arrangements/Services Living arrangements for the past 2 months: Single Family Home   Patient language and need for interpreter reviewed:: Yes        Need for Family Participation in Patient Care: No (Comment) Care giver support system in place?: Yes (comment)   Criminal Activity/Legal Involvement Pertinent to Current Situation/Hospitalization: No - Comment as needed  Activities of Daily Living Home Assistive Devices/Equipment: None ADL Screening (condition at time of admission) Patient's cognitive ability adequate to safely complete daily activities?: Yes Is the patient deaf or have difficulty hearing?:  No Does the patient have difficulty seeing, even when wearing glasses/contacts?: No Does the patient have difficulty concentrating, remembering, or making decisions?: No Patient able to express need for assistance with ADLs?: Yes Does the patient have difficulty dressing or bathing?: No Independently performs ADLs?: Yes (appropriate for developmental age) Does the patient have difficulty walking or climbing stairs?: No Weakness of Legs: Both Weakness of Arms/Hands: None      Orientation: : Oriented to Self, Oriented to Place, Oriented to  Time, Oriented to Situation Alcohol / Substance Use: Not Applicable Psych Involvement: No (comment)  Admission diagnosis:  LEGS SWOLLEN Patient Active Problem List   Diagnosis Date Noted  . Anasarca 07/03/2019  . Dyslipidemia 07/03/2019  . Chronic combined systolic and diastolic CHF (congestive heart failure) ([place]) 07/03/2019  . Bilateral leg edema   . CKD (chronic kidney disease) stage 5, GFR less than 15 ml/min (H[facility]) 06/22/2019  . Uncontrolled type 2 diabetes mellitus with hyperglycemia, with long-term current use of insulin ([place]) 06/22/2019  . Hypokalemia   . Thromboembolism ([place]) 02/20/2019  . AKI (acute kidney injury) ([place]) 02/20/2019  . Acute on chronic diastolic CHF (congestive heart failure) ([place]) 03/26/2018  . Acute respiratory failure with hypoxia ([place]) 03/26/2018  . Hyponatremia 06/12/2012  . Microcytic anemia 06/12/2012  . HTN (hypertension) 06/11/2012  . Elevated LFTs 06/11/2012  . Morbid obesity ([place]) 06/11/2012  . Hepatic steatosis 06/11/2012   PCP:  [name], MD Pharmacy:   [place], [place]  [address] [place] [address] Phone: [phone] Fax: [phone]     Social Determinants of Health (SDOH) Interventions    Readmission Risk Interventions Readmission Risk Prevention Plan 06/27/2019 06/23/2019 02/21/2019  Transportation Screening - Complete Complete  PCP or Specialist Appt  within 3-5 Days Complete - Complete  HRI or Home Care Consult - Complete Not Complete  HRI or Home Care Consult comments - - not needed, independent, active with [facility]  Social Work Consult for [place] Planning/Counseling - Complete Complete  Palliative Care Screening - Not Applicable Not Applicable  Medication Review Press photographer) - Complete Complete  Some recent data might be hidden

## 2019-07-04 NOTE — Progress Notes (Signed)
Initial Nutrition Assessment  DOCUMENTATION CODES:   Morbid obesity  INTERVENTION:  Provided HF and DM handouts /education   NUTRITION DIAGNOSIS:   Food and nutrition related knowledge deficit related to chronic illness(CHF) as evidenced by per patient/family report- has never received diet education.   GOAL: Patient will begin to adopt the habit of reading food labels of foods he commonly eats. He will limit his fluid intake to 6 cups daily.    MONITOR: Compliance of diet, weight trends and adequacy of intake    REASON FOR ASSESSMENT:   Consult Assessment of nutrition requirement/status  ASSESSMENT: Patient is an obese 48 yo male with history of DM (uncontrolled-last A1C-12.1%), CKD-4, Gout, HLD and CHF. Discharged from [facility] 8/31-Anasarca/Fluid overload/acute on chronic renal failure--CKD stage IV/Acute diastolic CHF. Patient up in chair during RD visit and says he didn't know about CHF diagnosis. RD provided Heart Failure and Diabetes focused handouts and reviewed basics of guidelines. Patient is consuming high sodium foods, reviewed healthy alternates such as salt substitute, fresh meats vs canned meat, fresh or frozen vegetables vs canned etc. He has not been reading food labels. Reviewed with him specifics of food labels. Talked with patient about fluid goal -1.5 liters daily and encouraged him to weigh daily. Weight history is variable between 147 kg (April), current 142.4 kg.      Intake/Output Summary (Last 24 hours) at 07/04/2019 1537 Last data filed at 07/04/2019 1300 Gross per 24 hour  Intake 480 ml  Output 2400 ml  Net -1920 ml     BMP Latest Ref Rng & Units 07/04/2019 07/03/2019 06/27/2019  Glucose 70 - 99 mg/dL 203(H) 243(H) 196(H)  BUN 6 - 20 mg/dL 66(H) 65(H) 59(H)  Creatinine 0.61 - 1.24 mg/dL 5.27(H) 5.15(H) 5.18(H)  Sodium 135 - 145 mmol/L 135 133(L) 138  Potassium 3.5 - 5.1 mmol/L 3.4(L) 3.5 3.6  Chloride 98 - 111 mmol/L 98 97(L) 102  CO2 22 - 32 mmol/L 25 22  25   Calcium 8.9 - 10.3 mg/dL 8.0(L) 8.0(L) 7.9(L)     NUTRITION - FOCUSED PHYSICAL EXAM: Findings are no fat depletion, no muscle depletion, and moderate BLE  edema.     Diet Order:   Diet Order            Diet heart healthy/carb modified Room service appropriate? Yes; Fluid consistency: Thin; Fluid restriction: 1500 mL Fluid  Diet effective now              EDUCATION NEEDS: addressed    Skin:  Skin Assessment: Reviewed RN Assessment  Last BM:  9/4  Height:   Ht Readings from Last 1 Encounters:  07/03/19 5\' 7"  (1.702 m)    Weight:   Wt Readings from Last 1 Encounters:  07/04/19 (!) 142.4 kg    Ideal Body Weight:  67 kg  BMI:  Body mass index is 49.17 kg/m.  Estimated Nutritional Needs:   Kcal:  2182 (RMR)  Protein:  60-70 gr  Fluid:  1500 ml daily- per MD   Colman Cater MS,RD,CSG,LDN Office: [phone] Pager: [phone]

## 2019-07-04 NOTE — Care Management Obs Status (Signed)
[facility] NOTIFICATION   Patient Details  Name: Kelly Key MRN: [id number] Date of Birth: [birth year]   Medicare Observation Status Notification Given:  Yes    [name] 07/04/2019, 12:41 PM

## 2019-07-05 ENCOUNTER — Other Ambulatory Visit: Payer: Self-pay | Admitting: *Deleted

## 2019-07-05 DIAGNOSIS — D509 Iron deficiency anemia, unspecified: Secondary | ICD-10-CM | POA: Diagnosis present

## 2019-07-05 DIAGNOSIS — R52 Pain, unspecified: Secondary | ICD-10-CM | POA: Diagnosis not present

## 2019-07-05 DIAGNOSIS — Z888 Allergy status to other drugs, medicaments and biological substances status: Secondary | ICD-10-CM | POA: Diagnosis not present

## 2019-07-05 DIAGNOSIS — E872 Acidosis: Secondary | ICD-10-CM | POA: Diagnosis present

## 2019-07-05 DIAGNOSIS — Z6841 Body Mass Index (BMI) 40.0 and over, adult: Secondary | ICD-10-CM | POA: Diagnosis not present

## 2019-07-05 DIAGNOSIS — I10 Essential (primary) hypertension: Secondary | ICD-10-CM | POA: Diagnosis not present

## 2019-07-05 DIAGNOSIS — Z800 Family history of malignant neoplasm of digestive organs: Secondary | ICD-10-CM | POA: Diagnosis not present

## 2019-07-05 DIAGNOSIS — Z8249 Family history of ischemic heart disease and other diseases of the circulatory system: Secondary | ICD-10-CM | POA: Diagnosis not present

## 2019-07-05 DIAGNOSIS — N189 Chronic kidney disease, unspecified: Secondary | ICD-10-CM | POA: Diagnosis not present

## 2019-07-05 DIAGNOSIS — N185 Chronic kidney disease, stage 5: Secondary | ICD-10-CM | POA: Diagnosis not present

## 2019-07-05 DIAGNOSIS — E1122 Type 2 diabetes mellitus with diabetic chronic kidney disease: Secondary | ICD-10-CM | POA: Diagnosis present

## 2019-07-05 DIAGNOSIS — Z79899 Other long term (current) drug therapy: Secondary | ICD-10-CM | POA: Diagnosis not present

## 2019-07-05 DIAGNOSIS — R188 Other ascites: Secondary | ICD-10-CM | POA: Diagnosis present

## 2019-07-05 DIAGNOSIS — E1165 Type 2 diabetes mellitus with hyperglycemia: Secondary | ICD-10-CM | POA: Diagnosis not present

## 2019-07-05 DIAGNOSIS — E785 Hyperlipidemia, unspecified: Secondary | ICD-10-CM | POA: Diagnosis present

## 2019-07-05 DIAGNOSIS — I5042 Chronic combined systolic (congestive) and diastolic (congestive) heart failure: Secondary | ICD-10-CM | POA: Diagnosis not present

## 2019-07-05 DIAGNOSIS — Z833 Family history of diabetes mellitus: Secondary | ICD-10-CM | POA: Diagnosis not present

## 2019-07-05 DIAGNOSIS — R600 Localized edema: Secondary | ICD-10-CM | POA: Diagnosis not present

## 2019-07-05 DIAGNOSIS — N178 Other acute kidney failure: Secondary | ICD-10-CM | POA: Diagnosis not present

## 2019-07-05 DIAGNOSIS — I5043 Acute on chronic combined systolic (congestive) and diastolic (congestive) heart failure: Secondary | ICD-10-CM | POA: Diagnosis present

## 2019-07-05 DIAGNOSIS — E1140 Type 2 diabetes mellitus with diabetic neuropathy, unspecified: Secondary | ICD-10-CM | POA: Diagnosis present

## 2019-07-05 DIAGNOSIS — I132 Hypertensive heart and chronic kidney disease with heart failure and with stage 5 chronic kidney disease, or end stage renal disease: Secondary | ICD-10-CM | POA: Diagnosis present

## 2019-07-05 DIAGNOSIS — Z794 Long term (current) use of insulin: Secondary | ICD-10-CM | POA: Diagnosis not present

## 2019-07-05 DIAGNOSIS — Z4901 Encounter for fitting and adjustment of extracorporeal dialysis catheter: Secondary | ICD-10-CM | POA: Diagnosis not present

## 2019-07-05 DIAGNOSIS — E8770 Fluid overload, unspecified: Secondary | ICD-10-CM | POA: Diagnosis present

## 2019-07-05 DIAGNOSIS — I130 Hypertensive heart and chronic kidney disease with heart failure and stage 1 through stage 4 chronic kidney disease, or unspecified chronic kidney disease: Secondary | ICD-10-CM | POA: Diagnosis not present

## 2019-07-05 DIAGNOSIS — R601 Generalized edema: Secondary | ICD-10-CM | POA: Diagnosis not present

## 2019-07-05 DIAGNOSIS — D638 Anemia in other chronic diseases classified elsewhere: Secondary | ICD-10-CM | POA: Diagnosis not present

## 2019-07-05 DIAGNOSIS — N179 Acute kidney failure, unspecified: Secondary | ICD-10-CM | POA: Diagnosis present

## 2019-07-05 DIAGNOSIS — D72829 Elevated white blood cell count, unspecified: Secondary | ICD-10-CM | POA: Diagnosis not present

## 2019-07-05 DIAGNOSIS — I5032 Chronic diastolic (congestive) heart failure: Secondary | ICD-10-CM | POA: Diagnosis not present

## 2019-07-05 DIAGNOSIS — N184 Chronic kidney disease, stage 4 (severe): Secondary | ICD-10-CM | POA: Diagnosis not present

## 2019-07-05 DIAGNOSIS — Z20828 Contact with and (suspected) exposure to other viral communicable diseases: Secondary | ICD-10-CM | POA: Diagnosis present

## 2019-07-05 DIAGNOSIS — I509 Heart failure, unspecified: Secondary | ICD-10-CM | POA: Diagnosis not present

## 2019-07-05 DIAGNOSIS — Z992 Dependence on renal dialysis: Secondary | ICD-10-CM | POA: Diagnosis not present

## 2019-07-05 DIAGNOSIS — M109 Gout, unspecified: Secondary | ICD-10-CM | POA: Diagnosis present

## 2019-07-05 DIAGNOSIS — N186 End stage renal disease: Secondary | ICD-10-CM | POA: Diagnosis not present

## 2019-07-05 DIAGNOSIS — Z825 Family history of asthma and other chronic lower respiratory diseases: Secondary | ICD-10-CM | POA: Diagnosis not present

## 2019-07-05 DIAGNOSIS — S66911D Strain of unspecified muscle, fascia and tendon at wrist and hand level, right hand, subsequent encounter: Secondary | ICD-10-CM | POA: Diagnosis not present

## 2019-07-05 DIAGNOSIS — M7989 Other specified soft tissue disorders: Secondary | ICD-10-CM | POA: Diagnosis not present

## 2019-07-05 DIAGNOSIS — R7309 Other abnormal glucose: Secondary | ICD-10-CM | POA: Diagnosis not present

## 2019-07-05 DIAGNOSIS — I150 Renovascular hypertension: Secondary | ICD-10-CM | POA: Diagnosis not present

## 2019-07-05 DIAGNOSIS — I951 Orthostatic hypotension: Secondary | ICD-10-CM | POA: Diagnosis not present

## 2019-07-05 DIAGNOSIS — D631 Anemia in chronic kidney disease: Secondary | ICD-10-CM | POA: Diagnosis present

## 2019-07-05 DIAGNOSIS — R5381 Other malaise: Secondary | ICD-10-CM | POA: Diagnosis not present

## 2019-07-05 DIAGNOSIS — K760 Fatty (change of) liver, not elsewhere classified: Secondary | ICD-10-CM | POA: Diagnosis present

## 2019-07-05 DIAGNOSIS — E876 Hypokalemia: Secondary | ICD-10-CM | POA: Diagnosis not present

## 2019-07-05 LAB — BASIC METABOLIC PANEL
Anion gap: 15 (ref 5–15)
Anion gap: 15 (ref 5–15)
BUN: 75 mg/dL — ABNORMAL HIGH (ref 6–20)
BUN: 78 mg/dL — ABNORMAL HIGH (ref 6–20)
CO2: 22 mmol/L (ref 22–32)
CO2: 22 mmol/L (ref 22–32)
Calcium: 7.9 mg/dL — ABNORMAL LOW (ref 8.9–10.3)
Calcium: 8 mg/dL — ABNORMAL LOW (ref 8.9–10.3)
Chloride: 97 mmol/L — ABNORMAL LOW (ref 98–111)
Chloride: 95 mmol/L — ABNORMAL LOW (ref 98–111)
Creatinine, Ser: 5.84 mg/dL — ABNORMAL HIGH (ref 0.61–1.24)
Creatinine, Ser: 6.1 mg/dL — ABNORMAL HIGH (ref 0.61–1.24)
GFR calc Af Amer: 12 mL/min — ABNORMAL LOW (ref >60)
GFR calc Af Amer: 11 mL/min — ABNORMAL LOW (ref >60)
GFR calc non Af Amer: 10 mL/min — ABNORMAL LOW (ref >60)
GFR calc non Af Amer: 10 mL/min — ABNORMAL LOW (ref >60)
Glucose, Bld: 114 mg/dL — ABNORMAL HIGH (ref 70–99)
Glucose, Bld: 252 mg/dL — ABNORMAL HIGH (ref 70–99)
Potassium: 3.2 mmol/L — ABNORMAL LOW (ref 3.5–5.1)
Potassium: 3.7 mmol/L (ref 3.5–5.1)
Sodium: 134 mmol/L — ABNORMAL LOW (ref 135–145)
Sodium: 132 mmol/L — ABNORMAL LOW (ref 135–145)

## 2019-07-05 LAB — GLUCOSE, CAPILLARY
Glucose-Capillary: 208 mg/dL — ABNORMAL HIGH (ref 70–99)
Glucose-Capillary: 92 mg/dL (ref 70–99)
Glucose-Capillary: 165 mg/dL — ABNORMAL HIGH (ref 70–99)
Glucose-Capillary: 234 mg/dL — ABNORMAL HIGH (ref 70–99)
Glucose-Capillary: 258 mg/dL — ABNORMAL HIGH (ref 70–99)

## 2019-07-05 LAB — MAGNESIUM: Magnesium: 2.1 mg/dL (ref 1.7–2.4)

## 2019-07-05 MED ORDER — TRAMADOL HCL 50 MG PO TABS
50.0000 mg | ORAL_TABLET | Freq: Once | ORAL | Status: AC
  Administered 2019-07-05: 50 mg via ORAL
  Filled 2019-07-05: qty 1

## 2019-07-05 MED ORDER — FUROSEMIDE 10 MG/ML IJ SOLN
80.0000 mg | Freq: Two times a day (BID) | INTRAMUSCULAR | Status: DC
  Administered 2019-07-05 (×2): 80 mg via INTRAVENOUS
  Filled 2019-07-05 (×2): qty 8

## 2019-07-05 MED ORDER — POTASSIUM CHLORIDE CRYS ER 20 MEQ PO TBCR
20.0000 meq | EXTENDED_RELEASE_TABLET | Freq: Once | ORAL | Status: AC
  Administered 2019-07-05: 20 meq via ORAL
  Filled 2019-07-05: qty 1

## 2019-07-05 MED ORDER — CHLORHEXIDINE GLUCONATE CLOTH 2 % EX PADS
6.0000 | MEDICATED_PAD | Freq: Once | CUTANEOUS | Status: AC
  Administered 2019-07-06: 6 via TOPICAL

## 2019-07-05 MED ORDER — CHLORHEXIDINE GLUCONATE CLOTH 2 % EX PADS
6.0000 | MEDICATED_PAD | Freq: Every day | CUTANEOUS | Status: DC
  Administered 2019-07-05 – 2019-07-16 (×10): 6 via TOPICAL

## 2019-07-05 MED ORDER — CHLORHEXIDINE GLUCONATE CLOTH 2 % EX PADS
6.0000 | MEDICATED_PAD | Freq: Once | CUTANEOUS | Status: AC
  Administered 2019-07-05: 6 via TOPICAL

## 2019-07-05 NOTE — Progress Notes (Signed)
Late entry: Patients CBG not crossing over to Epic, CBG 208 on 07/04/19 at 2124.

## 2019-07-05 NOTE — Progress Notes (Addendum)
[facility] Progress Note  Name: Kelly Key MRN: [id number] DOB: [birth year]  Chief Complaint:  Swelling and shortness of breath  Subjective:  Patient has been on lasix 60 mg IV every 6 hours.  Had 1.3 liters UOP charted over 9/8; states he .   States has been saving his urine.  He would like to talk with his fiance about dialysis and hasn't been able to do that yet.  He is considering doing dialysis - previously never thought he would because he watched a family member and he felt they suffered.  He still feels swollen.  States has been on clonidine for several years at this dose.  He does agree to be NPO after midnight tonight in the event that he has a procedure for access tomorrow.   Review of systems:  Denies overt shortness of breath  Denies chest pain  No nausea or vomiting  No dizziness No urinary hesitancy   ----------------------------- Background on consult:  Kelly Key is an 48 y.o. male with  a PMH significant for dCHF, DM, HTN, HLD, morbid obesity, fatty liver disease, and CKD stage 4 followed by Dr. Lowanda Yordi Krager with nephrotic range proteinuria (documented in past as >11 grams/24 hours) who was recently hospitalized on 06/22/2019 at [facility] with a 2 week history of worsening lower extremity edema and abdominal girth not responsive to increasing doses of torsemide as an outpatient. He has had accelerated HTN and workup for renal artery stenosis was underway with [name] however that has not been scheduled. During that hospitalization there were extensive discussions with the pt educating him about the need to initiate dialysis but he refused wanting to give a trial with outpt diuretics.  In the past [name] had expressed that he would "rather die than go on dialysis" and is followed by Dr. Lowanda Tekeisha Hakim, however on further discussion he always states that he's not ready to die. Patient went home with torsemide 60mg  BID and unfortunately did not respond even though he was  compliant. His leg swelling became worse with difficult ambulation and dyspnea as well. He denies NSAID use, nausea, CP or fevers.     Intake/Output Summary (Last 24 hours) at 07/05/2019 0818 Last data filed at 07/05/2019 0400 Gross per 24 hour  Intake 960 ml  Output 1300 ml  Net -340 ml    Vitals:  Vitals:   07/04/19 2121 07/05/19 0500 07/05/19 0504 07/05/19 0635  BP: (!) 141/88  128/67   Pulse: 91  85   Resp: 20  20   Temp: 98.9 F (37.2 C)   98.5 F (36.9 C)  TempSrc: Oral   Oral  SpO2: 97%  97%   Weight:  (!) 142.2 kg    Height:         Physical Exam:  General adult male in bed in no acute distress HEENT normocephalic atraumatic extraocular movements intact sclera anicteric Neck supple trachea midline Lungs clear to auscultation bilaterally normal work of breathing at rest; on room air  Heart regular rate and rhythm; no rub appreciated Abdomen soft nontender obese habitus/slightly distended  Extremities tight edema  Psych normal mood and affect   Medications reviewed   Labs:  BMP Latest Ref Rng & Units 07/05/2019 07/04/2019 07/03/2019  Glucose 70 - 99 mg/dL 114(H) 203(H) 243(H)  BUN 6 - 20 mg/dL 75(H) 66(H) 65(H)  Creatinine 0.61 - 1.24 mg/dL 5.84(H) 5.27(H) 5.15(H)  Sodium 135 - 145 mmol/L 134(L) 135 133(L)  Potassium 3.5 - 5.1 mmol/L  3.2(L) 3.4(L) 3.5  Chloride 98 - 111 mmol/L 97(L) 98 97(L)  CO2 22 - 32 mmol/L 22 25 22   Calcium 8.9 - 10.3 mg/dL 7.9(L) 8.0(L) 8.0(L)     Assessment/Plan:   # AKI on CKD in setting of decompensated diastolic CHF and escalating diuretic dosage.BL creatinine appears to be in the 3.6-4 range with e/o continued progression since mid 2019.No evidence of uremia.  Previously has had a difficult time making decision re: goals of care with regard to dialysis.  Recent renal u/s neg for hydro (06/23/19).   - Will soon have an indication for dialysis and I have recommended initiating dialysis.  He is considering and will talk with  family/fiance this AM  - Continue IV diuresis for now.  Unlikely that oral diuretics will be adequate  - Strict I&O's with floor weights please  - Will make NPO after midnight tonight in the event he has an access procedure tomorrow (the patient agrees to being NPO) - I will call back the floor to see if patient has made a decision later this morning to determine if we are able to proceed with access placement  - check post-void residual bladder scan with in/out cath if over 300 mL urine retained  # Anasarca/acute on chronic diastolic CHF - Started on IV lasix with decent response. His urine prot/creat ratio is markedly elevated at 6.3 mg/g, however albumin is only mildly decreased at 3.1 (consistent with DN and obesity related FSGS). - Continue with IV lasix , follow I's/O's, daily weights.  Change lasix to 80 mg IV BID - I have recommended transition to hemodialysis and he is considering.     # Malignant HTN- poorly controlled with unrevealing secondary HTN workup but pending MRA of renal arteries. He was seen by [name] who ordered test, will need to see if Radiology can do a non-contrasted MRA of renal arteries with good image quality.  - Felt that therapy of possible RAS would not preclude the need for HD.  Patient likely has very little renal reserve and PTA with contrast if he has significant RAS will almost certainly push him into AKI + HD. - Control improving  - Diurese as above  # CKD stage 4- has been progressive due to poorly controlled HTN and DM. Felt that kidney biopsy would not be helpful given chronicity and severity of CKD at this point. Providers have discussed the severity of his CKD as well as stage; have also discussed HD, home HD, PD, and renal transplantation (Dr. Augustin Coupe). Will have him watch educational video tapes regarding dialysis. We also discussed the ways to delay the progression of CKD with blood pressure control, DM management, and optimizing weight.  Given  ongoing aggressive diuresis, AKI/CKD, and his unwillingness to consider HD at this time, risks of ACE/ARB outweigh benefits  # DM- poorly controlled with Hgb A1c of 12.4%. Management per primary team   # Hypokalemia- replete with potassium 20 meq PO once.  Recheck BMP at 1300 and add on mag to AM labs   # Anemia of CKD- stable; no acute indication for ESA   # Obesity- consider dietary consult to help with DM control and weight loss   # Hyperuricemia- consider allopurinol or uloric per primary team    Claudia Desanctis, MD 07/05/2019 8:18 AM    Addendum:   He does agree to initiating dialysis and dialysis access placement.  Will consult surgery and plan for HD initiation following his access placement.  NPO  after midnight tonight for likely procedure tomorrow (tunneled catheter placement) if they are able.   Claudia Desanctis

## 2019-07-05 NOTE — Patient Outreach (Signed)
Referral received for follow up on [facility] EMMI "lost interest in things and sad, hopeless".  RN CM noted in chart pt admitted to [facility] 07/03/19 for lower extremity edema.    PLAN Continue to follow   Jacqlyn Larsen [facility], BSN [place] Coordinator [phone]

## 2019-07-05 NOTE — Progress Notes (Addendum)
Physical Therapy Treatment Patient Details Name: Kelly Key MRN: [id number] DOB: [birth year] Today's Date: 07/05/2019    History of Present Illness Kelly Key is a 48 y.o. male with medical history significant of obesity (BMI 49); HTN; HLD: DM; stage 4 CKD; and chronic combined CHF (EF 50-55% in 2013) presenting with increased LE edema, decreased ability to ambulate.  He felt better when he left the hospital.  During the week, his legs got weak and he fell once.  He started developing worsening LE edema.  It has been particularly bad for the last 3 days.  He denies SOB.  His weight has "probably" changed.  He can't lie flat because his feet hurt too much and feel like they weigh too much.  No PND.  No fevers.  He does not want to go to rehab.    PT Comments    Patient demonstrates slow labored movement for sitting up at bedside, had difficulty ambulating due to c/o increased bilateral ankle pain and pushing RW to far in front, frequently reaching for nearby objects for support while using RW and limited secondary to fatigue.  Patient able to transfer to commode in bathroom for a large BM and left sitting on commode with call light in reach - RN/NT notified.  Patient will benefit from continued physical therapy in hospital and recommended venue below to increase strength, balance, endurance for safe ADLs and gait.   Follow Up Recommendations  Home health PT     Equipment Recommendations  3in1 (PT)    Recommendations for Other Services       Precautions / Restrictions Precautions Precautions: Fall Restrictions Weight Bearing Restrictions: No    Mobility  Bed Mobility Overal bed mobility: Modified Independent             General bed mobility comments: increased time, labored movement  Transfers Overall transfer level: Needs assistance Equipment used: Rolling walker (2 wheeled) Transfers: Sit to/from [facility] Sit to Stand: Min guard Stand pivot transfers:  Min guard       General transfer comment: slow labored movement  Ambulation/Gait Ambulation/Gait assistance: Min guard;Min assist Gait Distance (Feet): 16 Feet Assistive device: Rolling walker (2 wheeled) Gait Pattern/deviations: Decreased step length - right;Decreased step length - left;Decreased stride length Gait velocity: decreased   General Gait Details: slow labored cadence requiring frequent verbal cues to step closer to RW with fair/poor carryover, tends to lean on RW pushing it to far ahead, limited for ambulation due to c/o fatigue and BLE ankle pain   Stairs             Wheelchair Mobility    Modified Rankin (Stroke Patients Only)       Balance Overall balance assessment: Needs assistance Sitting-balance support: Feet supported;No upper extremity supported Sitting balance-Leahy Scale: Good Sitting balance - Comments: seated at bedside   Standing balance support: During functional activity;Bilateral upper extremity supported Standing balance-Leahy Scale: Fair Standing balance comment: using RW                            Cognition Arousal/Alertness: Awake/alert Behavior During Therapy: WFL for tasks assessed/performed Overall Cognitive Status: Within Functional Limits for tasks assessed                                        Exercises General Exercises - Lower  Extremity Ankle Circles/Pumps: Seated;AROM;Strengthening;Both;10 reps    General Comments        Pertinent Vitals/Pain Pain Assessment: Faces Faces Pain Scale: Hurts little more Pain Location: ankles and stomach ache Pain Descriptors / Indicators: Tightness;Discomfort Pain Intervention(s): Limited activity within patient's tolerance;Monitored during session    Home Living                      Prior Function            PT Goals (current goals can now be found in the care plan section) Acute Rehab PT Goals Patient Stated Goal: return home PT Goal  Formulation: With patient Time For Goal Achievement: 07/08/19 Potential to Achieve Goals: Good Progress towards PT goals: Progressing toward goals    Frequency    Min 3X/week      PT Plan Current plan remains appropriate    Co-evaluation              AM-PAC PT "6 Clicks" Mobility   Outcome Measure  Help needed turning from your back to your side while in a flat bed without using bedrails?: None Help needed moving from lying on your back to sitting on the side of a flat bed without using bedrails?: None Help needed moving to and from a bed to a chair (including a wheelchair)?: A Little Help needed standing up from a chair using your arms (e.g., wheelchair or bedside chair)?: A Little Help needed to walk in hospital room?: A Lot Help needed climbing 3-5 steps with a railing? : A Lot 6 Click Score: 18    End of Session   Activity Tolerance: Patient tolerated treatment well;Patient limited by fatigue Patient left: with call bell/phone within reach(left sitting on commode in bathroom) Nurse Communication: Mobility status PT Visit Diagnosis: Unsteadiness on feet (R26.81);Other abnormalities of gait and mobility (R26.89);Muscle weakness (generalized) (M62.81);History of falling (Z91.81)     Time: [id number] PT Time Calculation (min) (ACUTE ONLY): 35 min  Charges:  $Therapeutic Activity: 23-37 mins                     2:25 PM, 07/05/19 Lonell Grandchild, MPT Physical Therapist with [facility] 336 [phone] office [phone] mobile phone

## 2019-07-05 NOTE — Progress Notes (Signed)
Patient Demographics:    Kelly Key, is a 48 y.o. male, DOB - 10-Jan-1971, [id number]  Admit date - 07/03/2019   Admitting Physician Karmen Bongo, MD  Outpatient Primary MD for the patient is [name], MD  LOS - 0   Chief Complaint  Patient presents with   Leg Swelling        Subjective:    Kelly Key today has no fevers, no emesis,  No chest pain,   --Continues to have significant dyspnea on exertion -Voiding okay  Assessment  & Plan :    Principal Problem:   Anasarca Active Problems:   HTN (hypertension)   Morbid obesity ([place])   CKD (chronic kidney disease) stage 5, GFR less than 15 ml/min ([place])   Uncontrolled type 2 diabetes mellitus with hyperglycemia, with long-term current use of insulin (H[facility])   Dyslipidemia   Chronic combined systolic and diastolic CHF (congestive heart failure) (H[facility])   Volume overload  Brief Summary 48 y.o. male with medical history significant of obesity (BMI 49); HTN; HLD: DM; stage 4 CKD; and chronic combined CHF (EF 50-55% in 2013) presenting with increased LE edema, decreased ability readmitted on 07/03/2019 with anasarca in the setting of dCHF exacerbation- -awaiting tunneled HD catheter placement prior to initiation of hemodialysis  A/p 1)Anasarca in the setting of HFpEF--last known EF per echo from 06/23/2019 is 50 to 55%, -patient with history of chronic low EF CHF--- presenting with acute on chronic preserved EF CHF exacerbation, -PTA patient was on torsemide 60 mg twice daily -Nephrologist has changed IV   IV Lasix 80 mg twice daily  --awaiting tunneled HD catheter placement prior to initiation of hemodialysis  2)HTN--- work-up for possible renal artery stenosis including MRI of renal arteries was discussed  By Dr Oneida Alar,  -He is scheduled for an MRA of his abdomen on 9/18 for evaluation of RAS, but this may be less meaningful if the patient is  transitioning to HD -Stable, continue Coreg 25 mg twice daily, clonidine 0.3 mg 3 times daily,  3)CKD IV--renal function continues to worsen, progressive CKD, -Prior renal ultrasound without obstructive uropathy -Nephrology service discussed option of dialysis with patient to better address his volume status -Continue sodium bicarbonate 650 twice daily --awaiting tunneled HD catheter placement prior to initiation of hemodialysis  4)Anemia of CKD----stable, hemoglobin 11.1  5)DM2-last A1c 12.4, okay to continue Levemir 35 units twice daily, along with sliding scale coverage  Disposition/Need for in-Hospital Stay- patient unable to be discharged at this time due to --anasarca requiring aggressive IV diuresis - -awaiting tunneled HD catheter placement prior to initiation of hemodialysis  Code Status : FULL  Family Communication:   (patient is alert, awake and coherent) Discussed with patient and girlfriend at bedside at patient request  Disposition Plan  : TBD  Consults  :  Nephrology/general surgery  Procedures- - tunneled HD catheter placement planned for 07/06/2019 prior to initiation of hemodialysis   DVT Prophylaxis  :   - Heparin - SCDs  Lab Results  Component Value Date   PLT 314 07/04/2019    Inpatient Medications  Scheduled Meds:  aspirin EC  81 mg Oral Daily   atorvastatin  80 mg Oral Daily   carvedilol  25 mg Oral BID  WC   Chlorhexidine Gluconate Cloth  6 each Topical Q0600   cloNIDine  0.3 mg Oral TID   ezetimibe  10 mg Oral QPM   fluticasone  1 spray Each Nare Daily   furosemide  80 mg Intravenous BID   gabapentin  100 mg Oral TID   heparin  5,000 Units Subcutaneous Q8H   insulin aspart  0-20 Units Subcutaneous TID WC   insulin aspart  0-5 Units Subcutaneous QHS   insulin detemir  35 Units Subcutaneous BID   linaclotide  145 mcg Oral QAC breakfast   sodium chloride flush  3 mL Intravenous Q12H   Continuous Infusions:  sodium chloride      PRN Meds:.sodium chloride, acetaminophen, albuterol, calcium carbonate (dosed in mg elemental calcium), camphor-menthol **AND** hydrOXYzine, docusate sodium, feeding supplement (NEPRO CARB STEADY), HYDROcodone-acetaminophen, ondansetron (ZOFRAN) IV, sodium chloride flush, sorbitol, zolpidem    Anti-infectives (From admission, onward)   None        Objective:   Vitals:   07/05/19 0500 07/05/19 0504 07/05/19 0635 07/05/19 1333  BP:  128/67  125/73  Pulse:  85  80  Resp:  20  20  Temp:   98.5 F (36.9 C) 99.6 F (37.6 C)  TempSrc:   Oral Oral  SpO2:  97%  97%  Weight: (!) 142.2 kg     Height:        Wt Readings from Last 3 Encounters:  07/05/19 (!) 142.2 kg  06/27/19 (!) 141.6 kg  06/16/19 (!) 138.8 kg     Intake/Output Summary (Last 24 hours) at 07/05/2019 1842 Last data filed at 07/05/2019 1802 Gross per 24 hour  Intake 1200 ml  Output 1725 ml  Net -525 ml     Physical Exam  Gen:- Awake Alert, morbidly obese,  HEENT:- [place].AT, No sclera icterus Neck-Supple Neck,No JVD,.  Nose- 3 L/min Breast-gynecomastia Lungs-diminished in bases with bibasilar rales  CV- S1, S2 normal, regular  Abd-  +ve B.Sounds, Abd Soft, increased truncal adiposity extremity/Skin: 3 + pitting edema/anasarca, pedal pulses present  Psych-affect is appropriate, oriented x3 Neuro-generalized weakness, no new focal deficits, no tremors   Data Review:   Micro Results Recent Results (from the past 240 hour(s))  SARS Coronavirus 2 [facility] order, Performed in [facility] hospital lab) Nasopharyngeal Nasopharyngeal Swab     Status: None   Collection Time: 07/03/19  4:08 PM   Specimen: Nasopharyngeal Swab  Result Value Ref Range Status   SARS Coronavirus 2 NEGATIVE NEGATIVE Final    Comment: (NOTE) If result is NEGATIVE SARS-CoV-2 target nucleic acids are NOT DETECTED. The SARS-CoV-2 RNA is generally detectable in upper and lower  respiratory specimens during the acute phase of infection.  The lowest  concentration of SARS-CoV-2 viral copies this assay can detect is 250  copies / mL. A negative result does not preclude SARS-CoV-2 infection  and should not be used as the sole basis for treatment or other  patient management decisions.  A negative result may occur with  improper specimen collection / handling, submission of specimen other  than nasopharyngeal swab, presence of viral mutation(s) within the  areas targeted by this assay, and inadequate number of viral copies  (<250 copies / mL). A negative result must be combined with clinical  observations, patient history, and epidemiological information. If result is POSITIVE SARS-CoV-2 target nucleic acids are DETECTED. The SARS-CoV-2 RNA is generally detectable in upper and lower  respiratory specimens dur ing the acute phase of infection.  Positive  results are indicative of active infection with SARS-CoV-2.  Clinical  correlation with patient history and other diagnostic information is  necessary to determine patient infection status.  Positive results do  not rule out bacterial infection or co-infection with other viruses. If result is PRESUMPTIVE POSTIVE SARS-CoV-2 nucleic acids MAY BE PRESENT.   A presumptive positive result was obtained on the submitted specimen  and confirmed on repeat testing.  While 2019 novel coronavirus  (SARS-CoV-2) nucleic acids may be present in the submitted sample  additional confirmatory testing may be necessary for epidemiological  and / or clinical management purposes  to differentiate between  SARS-CoV-2 and other Sarbecovirus currently known to infect humans.  If clinically indicated additional testing with an alternate test  methodology [phone]) is advised. The SARS-CoV-2 RNA is generally  detectable in upper and lower respiratory sp ecimens during the acute  phase of infection. The expected result is Negative. Fact Sheet for Patients:   StrictlyIdeas.no Fact Sheet for Healthcare Providers: BankingDealers.co.za This test is not yet approved or cleared by the [place] FDA and has been authorized for detection and/or diagnosis of SARS-CoV-2 by FDA under an Emergency Use Authorization (EUA).  This EUA will remain in effect (meaning this test can be used) for the duration of the COVID-19 declaration under Section 564(b)(1) of the Act, 21 U.S.C. section 360bbb-3(b)(1), unless the authorization is terminated or revoked sooner. Performed at [facility], [address] [place], [place] [address]     Radiology Reports Dg Chest 2 View  Result Date: 07/03/2019 CLINICAL DATA:  History of end-stage renal disease and CHF. EXAM: CHEST - 2 VIEW COMPARISON:  Chest radiograph 06/22/2019, 04/28/2019 FINDINGS: Stable cardiomediastinal contours with enlarged heart size. A few minimal linear opacities at the right lung base likely reflect atelectasis or scar. No new focal infiltrate or evidence of edema. No pneumothorax or pleural effusion. No acute findings in the visualized skeleton. IMPRESSION: No evidence of active disease. Electronically Signed   By: Audie Pinto M.D.   On: 07/03/2019 13:57   US Renal  Result Date: 06/23/2019 CLINICAL DATA:  Acute renal failure EXAM: RENAL / URINARY TRACT ULTRASOUND COMPLETE COMPARISON:  None. FINDINGS: Right Kidney: Renal measurements: 11.7 x 4.8 x 6.1 cm = volume: 177 mL . Echogenicity within normal limits. No mass or hydronephrosis visualized. Left Kidney: Renal measurements: 11.1 x 5.3 x 5.9 cm = volume: 181 mL. Echogenicity within normal limits. No mass or hydronephrosis visualized. Bladder: Appears normal for degree of bladder distention. IMPRESSION: Normal bilateral renal ultrasound. Electronically Signed   By: Kathreen Devoid   On: 06/23/2019 11:46   US Venous Img Lower Bilateral  Result Date: 06/22/2019 CLINICAL DATA:  Bilateral lower extremity  pain and edema. Evaluate for DVT. EXAM: BILATERAL LOWER EXTREMITY VENOUS DOPPLER ULTRASOUND TECHNIQUE: Gray-scale sonography with graded compression, as well as color Doppler and duplex ultrasound were performed to evaluate the lower extremity deep venous systems from the level of the common femoral vein and including the common femoral, femoral, profunda femoral, popliteal and calf veins including the posterior tibial, peroneal and gastrocnemius veins when visible. The superficial great saphenous vein was also interrogated. Spectral Doppler was utilized to evaluate flow at rest and with distal augmentation maneuvers in the common femoral, femoral and popliteal veins. COMPARISON:  None. FINDINGS: RIGHT LOWER EXTREMITY Common Femoral Vein: No evidence of thrombus. Normal compressibility, respiratory phasicity and response to augmentation. Saphenofemoral Junction: No evidence of thrombus. Normal compressibility and flow on color Doppler imaging. Profunda Femoral  Vein: No evidence of thrombus. Normal compressibility and flow on color Doppler imaging. Femoral Vein: No evidence of thrombus. Normal compressibility, respiratory phasicity and response to augmentation. Popliteal Vein: No evidence of thrombus. Normal compressibility, respiratory phasicity and response to augmentation. Calf Veins: No evidence of thrombus. Normal compressibility and flow on color Doppler imaging. Superficial Great Saphenous Vein: No evidence of thrombus. Normal compressibility. Venous Reflux:  None. Other Findings:  None. LEFT LOWER EXTREMITY Common Femoral Vein: No evidence of thrombus. Normal compressibility, respiratory phasicity and response to augmentation. Saphenofemoral Junction: No evidence of thrombus. Normal compressibility and flow on color Doppler imaging. Profunda Femoral Vein: No evidence of thrombus. Normal compressibility and flow on color Doppler imaging. Femoral Vein: No evidence of thrombus. Normal compressibility,  respiratory phasicity and response to augmentation. Popliteal Vein: No evidence of thrombus. Normal compressibility, respiratory phasicity and response to augmentation. Calf Veins: No evidence of thrombus. Normal compressibility and flow on color Doppler imaging. Superficial Great Saphenous Vein: No evidence of thrombus. Normal compressibility. Venous Reflux:  None. Other Findings:  None. IMPRESSION: No evidence of DVT within either lower extremity. Electronically Signed   By: Sandi Mariscal M.D.   On: 06/22/2019 14:52   Dg Chest Port 1 View  Result Date: 06/22/2019 CLINICAL DATA:  Pt c/o swelling in lower extremities x several days. Hx CHF, obesity, diabetes, nonsmoker EXAM: PORTABLE CHEST 1 VIEW COMPARISON:  Chest radiographs dated 04/28/2019, 02/23/2019 FINDINGS: Stable cardiomediastinal contours with enlarged heart size. Central vascular congestion without overt edema. No pneumothorax or pleural effusion. Visualized skeleton is unremarkable. IMPRESSION: Cardiomegaly and central vascular congestion without overt edema. Electronically Signed   By: Audie Pinto M.D.   On: 06/22/2019 10:53   Vas US Renal Artery Duplex  Result Date: 06/16/2019 ABDOMINAL VISCERAL High Risk Factors: Hypertension, hyperlipidemia, Diabetes, no history of                    smoking. Other Factors: Limited study secondary to obesity and bowel gas. Limitations: Air/bowel gas and obesity. Performing Technologist: Delorise Shiner RVT  Examination Guidelines: A complete evaluation includes B-mode imaging, spectral Doppler, color Doppler, and power Doppler as needed of all accessible portions of each vessel. Bilateral testing is considered an integral part of a complete examination. Limited examinations for reoccurring indications may be performed as noted.  Duplex Findings: +----------+--------+--------+------+--------+  Mesenteric PSV cm/s EDV cm/s Plaque Comments  +----------+--------+--------+------+--------+  Aorta Mid     58                               +----------+--------+--------+------+--------+  +------------------+--------+--------+-------+  Right Renal Artery PSV cm/s EDV cm/s Comment  +------------------+--------+--------+-------+  Proximal              25                      +------------------+--------+--------+-------+  Mid                   26                      +------------------+--------+--------+-------+  Distal                27                      +------------------+--------+--------+-------+ +-----------------+--------+--------+-------+  Left Renal Artery PSV cm/s EDV cm/s Comment  +-----------------+--------+--------+-------+  Origin  40                      +-----------------+--------+--------+-------+  Proximal             29                      +-----------------+--------+--------+-------+  Mid                  27                      +-----------------+--------+--------+-------+  Distal               23                      +-----------------+--------+--------+-------+ +------------+--------+--------+--+-----------+--------+--------+---+  Right Kidney PSV cm/s EDV cm/s RI Left Kidney PSV cm/s EDV cm/s RI   +------------+--------+--------+--+-----------+--------+--------+---+  Upper Pole                        Upper Pole                         +------------+--------+--------+--+-----------+--------+--------+---+  Mid                               Mid                                +------------+--------+--------+--+-----------+--------+--------+---+  Lower Pole                        Lower Pole                         +------------+--------+--------+--+-----------+--------+--------+---+  Hilar                             Hilar                              +------------+--------+--------+--+-----------+--------+--------+---+ +------------------+-------+------------------+-------+  Right Kidney               Left Kidney                 +------------------+-------+------------------+-------+  RAR                         RAR                         +------------------+-------+------------------+-------+  RAR (manual)               RAR (manual)                +------------------+-------+------------------+-------+  Cortex                     Cortex                      +------------------+-------+------------------+-------+  Cortex thickness   1.88 mm Corex thickness    1.84 mm  +------------------+-------+------------------+-------+  Kidney length (cm) 13.27   Kidney length (cm) 10.24    +------------------+-------+------------------+-------+  Summary: Renal:  Right: Normal size right  kidney. No evidence of right renal artery        stenosis. RRV flow present. Limited study secondary to body        habitus. Could not insonnate right renal artery origin. Left:  Normal size of left kidney. No evidence of left renal artery        stenosis. LRV flow present. Limited study secondary to body        habitus.  *See table(s) above for measurements and observations.  Diagnosing physician: Ruta Hinds MD  Electronically signed by Ruta Hinds MD on 06/16/2019 at 10:43:24 AM.    Final      CBC Recent Labs  Lab 07/03/19 1318 07/04/19 0449  WBC 10.9* 8.8  HGB 11.6* 11.1*  HCT 36.7* 35.4*  PLT 334 314  MCV 77.4* 78.1*  MCH 24.5* 24.5*  MCHC 31.6 31.4  RDW 15.3 15.1  LYMPHSABS  --  1.9  MONOABS  --  1.1*  EOSABS  --  0.2  BASOSABS  --  0.1    Chemistries  Recent Labs  Lab 07/03/19 1318 07/04/19 0449 07/05/19 0459 07/05/19 1321  NA 133* 135 134* 132*  K 3.5 3.4* 3.2* 3.7  CL 97* 98 97* 95*  CO2 22 25 22 22   GLUCOSE 243* 203* 114* 252*  BUN 65* 66* 75* 78*  CREATININE 5.15* 5.27* 5.84* 6.10*  CALCIUM 8.0* 8.0* 7.9* 8.0*  MG  --   --  2.1  --    ------------------------------------------------------------------------------------------------------------------ No results for input(s): CHOL, HDL, LDLCALC, TRIG, CHOLHDL, LDLDIRECT in the last 72 hours.  Lab Results  Component Value Date    HGBA1C 12.1 (H) 06/23/2019   ------------------------------------------------------------------------------------------------------------------ No results for input(s): TSH, T4TOTAL, T3FREE, THYROIDAB in the last 72 hours.  Invalid input(s): FREET3 ------------------------------------------------------------------------------------------------------------------ No results for input(s): VITAMINB12, FOLATE, FERRITIN, TIBC, IRON, RETICCTPCT in the last 72 hours.  Coagulation profile Recent Labs  Lab 07/03/19 1318  INR 1.1    No results for input(s): DDIMER in the last 72 hours.  Cardiac Enzymes No results for input(s): CKMB, TROPONINI, MYOGLOBIN in the last 168 hours.  Invalid input(s): CK ------------------------------------------------------------------------------------------------------------------    Component Value Date/Time   BNP 119.0 (H) 06/22/2019 1053    Roxan Hockey M.D on 07/05/2019 at 6:42 PM  Go to [email or web address] - for contact info  [facility] - Office  [phone]

## 2019-07-05 NOTE — Progress Notes (Signed)
[facility]  See patient tomorrow prior to tunneled dialysis catheter placement. Will consent patient at that time.  Discussed with nephrology. NPO midnight Ancef OCTOR.  Curlene Labrum, MD [facility] [address] [place], [place] [address] [phone] (office)

## 2019-07-05 NOTE — Progress Notes (Signed)
Patient complaining of sharp, left lower chest and abd pain with inspiration. Pain rated at an 8. Expiratory wheezing noted to left upper lobe. Patient denies shortness of breath or coughing. Vitals obtained. Mid level notified. New orders placed.

## 2019-07-06 ENCOUNTER — Inpatient Hospital Stay (HOSPITAL_COMMUNITY): Payer: Medicare Other | Admitting: Anesthesiology

## 2019-07-06 ENCOUNTER — Encounter (HOSPITAL_COMMUNITY)
Admission: EM | Disposition: A | Discharge status: Rehabilitation Facility | Payer: Self-pay | Source: Home / Self Care | Attending: Internal Medicine

## 2019-07-06 ENCOUNTER — Other Ambulatory Visit: Payer: Self-pay

## 2019-07-06 ENCOUNTER — Encounter (HOSPITAL_COMMUNITY): Payer: Self-pay

## 2019-07-06 DIAGNOSIS — I509 Heart failure, unspecified: Secondary | ICD-10-CM

## 2019-07-06 DIAGNOSIS — N185 Chronic kidney disease, stage 5: Secondary | ICD-10-CM

## 2019-07-06 DIAGNOSIS — E8770 Fluid overload, unspecified: Secondary | ICD-10-CM

## 2019-07-06 LAB — GLUCOSE, CAPILLARY
Glucose-Capillary: 123 mg/dL — ABNORMAL HIGH (ref 70–99)
Glucose-Capillary: 258 mg/dL — ABNORMAL HIGH (ref 70–99)
Glucose-Capillary: 208 mg/dL — ABNORMAL HIGH (ref 70–99)

## 2019-07-06 LAB — IRON AND TIBC
Iron: 11 ug/dL — ABNORMAL LOW (ref 45–182)
Saturation Ratios: 4 % — ABNORMAL LOW (ref 17.9–39.5)
TIBC: 278 ug/dL (ref 250–450)
UIBC: 267 ug/dL

## 2019-07-06 LAB — CBC
HCT: 30.9 % — ABNORMAL LOW (ref 39.0–52.0)
Hemoglobin: 9.5 g/dL — ABNORMAL LOW (ref 13.0–17.0)
MCH: 24 pg — ABNORMAL LOW (ref 26.0–34.0)
MCHC: 30.7 g/dL (ref 30.0–36.0)
MCV: 78 fL — ABNORMAL LOW (ref 80.0–100.0)
Platelets: 352 10*3/uL (ref 150–400)
RBC: 3.96 MIL/uL — ABNORMAL LOW (ref 4.22–5.81)
RDW: 15.1 % (ref 11.5–15.5)
WBC: 9 10*3/uL (ref 4.0–10.5)
nRBC: 0 % (ref 0.0–0.2)

## 2019-07-06 LAB — RENAL FUNCTION PANEL
Albumin: 2.3 g/dL — ABNORMAL LOW (ref 3.5–5.0)
Anion gap: 17 — ABNORMAL HIGH (ref 5–15)
BUN: 83 mg/dL — ABNORMAL HIGH (ref 6–20)
CO2: 19 mmol/L — ABNORMAL LOW (ref 22–32)
Calcium: 7.7 mg/dL — ABNORMAL LOW (ref 8.9–10.3)
Chloride: 96 mmol/L — ABNORMAL LOW (ref 98–111)
Creatinine, Ser: 6.32 mg/dL — ABNORMAL HIGH (ref 0.61–1.24)
GFR calc Af Amer: 11 mL/min — ABNORMAL LOW (ref >60)
GFR calc non Af Amer: 9 mL/min — ABNORMAL LOW (ref >60)
Glucose, Bld: 147 mg/dL — ABNORMAL HIGH (ref 70–99)
Phosphorus: 7.9 mg/dL — ABNORMAL HIGH (ref 2.5–4.6)
Potassium: 3.3 mmol/L — ABNORMAL LOW (ref 3.5–5.1)
Sodium: 132 mmol/L — ABNORMAL LOW (ref 135–145)

## 2019-07-06 LAB — SURGICAL PCR SCREEN
MRSA, PCR: POSITIVE — AB
Staphylococcus aureus: POSITIVE — AB

## 2019-07-06 LAB — HEPATITIS B SURFACE ANTIBODY, QUANTITATIVE: Hep B S AB Quant (Post): 8.1 m[IU]/mL — ABNORMAL LOW (ref >9.9)

## 2019-07-06 LAB — HEPATITIS B CORE ANTIBODY, TOTAL: Hep B Core Total Ab: NEGATIVE

## 2019-07-06 LAB — FERRITIN: Ferritin: 216 ng/mL (ref 24–336)

## 2019-07-06 SURGERY — INSERTION OF DIALYSIS CATHETER
Anesthesia: Choice | Laterality: Right

## 2019-07-06 MED ORDER — MUPIROCIN 2 % EX OINT
1.0000 "application " | TOPICAL_OINTMENT | Freq: Two times a day (BID) | CUTANEOUS | Status: AC
  Administered 2019-07-06 – 2019-07-10 (×10): 1 via NASAL
  Filled 2019-07-06 (×3): qty 22

## 2019-07-06 MED ORDER — DEXTROSE 5 % IV SOLN
3.0000 g | INTRAVENOUS | Status: DC
  Filled 2019-07-06 (×2): qty 3000

## 2019-07-06 MED ORDER — ALLOPURINOL 100 MG PO TABS
200.0000 mg | ORAL_TABLET | Freq: Every day | ORAL | Status: DC
  Administered 2019-07-07 – 2019-07-16 (×9): 200 mg via ORAL
  Filled 2019-07-06 (×9): qty 2

## 2019-07-06 MED ORDER — FUROSEMIDE 10 MG/ML IJ SOLN
80.0000 mg | Freq: Once | INTRAMUSCULAR | Status: AC
  Administered 2019-07-06: 80 mg via INTRAVENOUS
  Filled 2019-07-06: qty 8

## 2019-07-06 MED ORDER — HEPARIN SODIUM (PORCINE) 1000 UNIT/ML IJ SOLN
INTRAMUSCULAR | Status: AC
  Filled 2019-07-06: qty 4

## 2019-07-06 MED ORDER — LIDOCAINE HCL (PF) 1 % IJ SOLN
INTRAMUSCULAR | Status: AC
  Filled 2019-07-06: qty 30

## 2019-07-06 MED ORDER — POTASSIUM CHLORIDE CRYS ER 20 MEQ PO TBCR: 20.0000 meq | EXTENDED_RELEASE_TABLET | Freq: Once | ORAL | Status: DC

## 2019-07-06 MED ORDER — FUROSEMIDE 10 MG/ML IJ SOLN
80.0000 mg | Freq: Every day | INTRAMUSCULAR | Status: DC
  Administered 2019-07-06: 80 mg via INTRAVENOUS
  Filled 2019-07-06 (×2): qty 8

## 2019-07-06 NOTE — Consult Note (Signed)
[facility] Consult  Reason for Consult: Tunneled dialysis catheter, ESRD Referring Physician:  Dr. Royce Macadamia   Chief Complaint    Leg Swelling      HPI: Kelly Key is a 48 y.o. male with a history of CHF, anasarca, SOB who has CKD secondary to HTN and DM and has not started dialysis. He has been in and out of the hospital and has tried outpatient diuretic without success.   He has refused dialysis in the past and wanted to avoid it this time, but realizes that he does not do it that he is going to continue to be admitted to the hospital and ultimately die. I have been asked to place a tunneled dialysis catheter to aid in initiation of his dialysis treatment.   He has never had any other access that he reports. He does report a fall with bracing on the left hand and some left hand swelling and pain currently.    Past Medical History:  Diagnosis Date  . Acute diastolic CHF (congestive heart failure) ([place]) 06/11/2012   EF 50-55% [facility])  . Diabetes mellitus    A1c 11.5 (06/11/2012).  . Gout   . Hepatic steatosis 06/11/2012   Elevated LFTs  . Hyperlipemia   . Malignant hypertension   . Microcytic anemia 06/12/2012  . Obesity     Past Surgical History:  Procedure Laterality Date  . NO PAST SURGERIES      Family History  Problem Relation Age of Onset  . Gout Mother   . Asthma Mother   . Diabetes Father   . Heart failure Father   . Diabetes Sister   . Hypertension Brother   . Pancreatic cancer Brother   . Diabetes Sister     Social History   Tobacco Use  . Smoking status: Never Smoker  . Smokeless tobacco: Never Used  Substance Use Topics  . Alcohol use: No    Comment: haven't drank in over 5 years   . Drug use: No    Medications:  I have reviewed the patient's current medications. Prior to Admission:  Medications Prior to Admission  Medication Sig Dispense Refill Last Dose  . albuterol (VENTOLIN HFA) 108 (90 Base) MCG/ACT inhaler  Inhale 2 puffs into the lungs every 4 (four) hours as needed.   Past Month at Unknown time  . amLODipine (NORVASC) 10 MG tablet Take 10 mg by mouth daily.   07/02/2019 at Unknown time  . atorvastatin (LIPITOR) 80 MG tablet Take 1 tablet by mouth daily.   07/02/2019 at Unknown time  . carvedilol (COREG) 25 MG tablet Take 1 tablet (25 mg total) by mouth 2 (two) times daily with a meal. 60 tablet 1 07/02/2019 at 1800  . cloNIDine (CATAPRES) 0.3 MG tablet Take 1 tablet (0.3 mg total) by mouth 3 (three) times daily. 90 tablet 0 07/02/2019 at Unknown time  . ergocalciferol (VITAMIN D2) 1.25 MG (50000 UT) capsule Take 50,000 Units by mouth once a week.   Past Week at Unknown time  . ezetimibe (ZETIA) 10 MG tablet Take 10 mg by mouth every evening.   07/02/2019 at Unknown time  . fluticasone (FLONASE) 50 MCG/ACT nasal spray Place 1 spray into both nostrils daily. 1 g 0 Past Month at Unknown time  . gabapentin (NEURONTIN) 100 MG capsule Take 1 capsule by mouth 3 (three) times daily.   07/02/2019 at Unknown time  . insulin aspart (NOVOLOG) 100 UNIT/ML injection Inject 5-15 Units into the skin 3 (  three) times daily before meals.    07/02/2019 at Unknown time  . insulin detemir (LEVEMIR) 100 UNIT/ML injection Inject 0.35 mLs (35 Units total) into the skin 2 (two) times daily. 10 mL 1 07/02/2019 at Unknown time  . Insulin Pen Needle 31G X 5 MM MISC Use as directed. 100 each 0 07/02/2019 at Unknown time  . linaclotide (LINZESS) 145 MCG CAPS capsule Take 1 capsule (145 mcg total) by mouth daily before breakfast. 30 capsule 0 07/02/2019 at Unknown time  . potassium chloride SA (K-DUR) 20 MEQ tablet Take 1 tablet (20 mEq total) by mouth daily.   07/02/2019 at Unknown time  . sodium bicarbonate 650 MG tablet Take 650 mg by mouth 2 (two) times daily.   07/02/2019 at Unknown time  . torsemide (DEMADEX) 20 MG tablet Take 3 tablets (60 mg total) by mouth 2 (two) times daily. 180 tablet 1 07/02/2019 at Unknown time   Scheduled: . allopurinol  200  mg Oral Daily  . aspirin EC  81 mg Oral Daily  . atorvastatin  80 mg Oral Daily  . carvedilol  25 mg Oral BID WC  . Chlorhexidine Gluconate Cloth  6 each Topical Q0600  . cloNIDine  0.3 mg Oral TID  . ezetimibe  10 mg Oral QPM  . fluticasone  1 spray Each Nare Daily  . furosemide  80 mg Intravenous Daily  . gabapentin  100 mg Oral TID  . heparin  5,000 Units Subcutaneous Q8H  . insulin aspart  0-20 Units Subcutaneous TID WC  . insulin aspart  0-5 Units Subcutaneous QHS  . insulin detemir  35 Units Subcutaneous BID  . linaclotide  145 mcg Oral QAC breakfast  . mupirocin ointment  1 application Nasal BID  . potassium chloride  20 mEq Oral Once  . sodium chloride flush  3 mL Intravenous Q12H   Continuous: . sodium chloride     [id number] chloride, acetaminophen, albuterol, calcium carbonate (dosed in mg elemental calcium), camphor-menthol **AND** hydrOXYzine, docusate sodium, feeding supplement (NEPRO CARB STEADY), HYDROcodone-acetaminophen, ondansetron (ZOFRAN) IV, sodium chloride flush, sorbitol, zolpidem  Allergies  Allergen Reactions  . Hydralazine   . Lisinopril Swelling     ROS:  A comprehensive review of systems was negative except for: Respiratory: positive for pleurisy/chest pain and SOB, pain with deep inspiration Cardiovascular: positive for No pressure or chest pain without deep breathe Musculoskeletal: positive for left hand pain and swelling  Blood pressure (!) 161/86, pulse 82, temperature 98.3 F (36.8 C), temperature source Oral, resp. rate 20, height 5\' 7"  (1.702 m), weight (!) 141.6 kg, SpO2 97 %. Physical Exam Vitals signs reviewed.  Constitutional:      Appearance: Normal appearance. He is obese.  HENT:     Head: Normocephalic and atraumatic.     Nose: Nose normal.     Mouth/Throat:     Mouth: Mucous membranes are moist.  Eyes:     Pupils: Pupils are equal, round, and reactive to light.  Neck:     Musculoskeletal: No neck rigidity.     Comments:  Large neck Cardiovascular:     Rate and Rhythm: Normal rate and regular rhythm.  Pulmonary:     Effort: Pulmonary effort is normal.     Breath sounds: Normal breath sounds.  Abdominal:     General: There is no distension.     Palpations: Abdomen is soft.     Tenderness: There is no abdominal tenderness.  Musculoskeletal:     Right lower leg:  Edema present.     Left lower leg: Edema present.     Comments: Left hand swelling and tenderness near the thenar eminence   Skin:    General: Skin is warm.  Neurological:     General: No focal deficit present.     Mental Status: He is alert and oriented to person, place, and time.  Psychiatric:        Mood and Affect: Mood normal.        Behavior: Behavior normal.        Thought Content: Thought content normal.        Judgment: Judgment normal.     Results: Results for orders placed or performed during the hospital encounter of 07/03/19 (from the past 48 hour(s))  Glucose, capillary     Status: Abnormal   Collection Time: 07/04/19 11:19 AM  Result Value Ref Range   Glucose-Capillary 218 (H) 70 - 99 mg/dL  Glucose, capillary     Status: Abnormal   Collection Time: 07/04/19  4:08 PM  Result Value Ref Range   Glucose-Capillary 209 (H) 70 - 99 mg/dL  Glucose, capillary     Status: Abnormal   Collection Time: 07/04/19  9:24 PM  Result Value Ref Range   Glucose-Capillary 208 (H) 70 - 99 mg/dL   Comment 1 QC Due    Comment 2 Notify RN    Comment 3 Document in Chart   Basic metabolic panel     Status: Abnormal   Collection Time: 07/05/19  4:59 AM  Result Value Ref Range   Sodium 134 (L) 135 - 145 mmol/L   Potassium 3.2 (L) 3.5 - 5.1 mmol/L   Chloride 97 (L) 98 - 111 mmol/L   CO2 22 22 - 32 mmol/L   Glucose, Bld 114 (H) 70 - 99 mg/dL   BUN 75 (H) 6 - 20 mg/dL   Creatinine, Ser 5.84 (H) 0.61 - 1.24 mg/dL   Calcium 7.9 (L) 8.9 - 10.3 mg/dL   GFR calc non Af Amer 10 (L) >60 mL/min   GFR calc Af Amer 12 (L) >60 mL/min   Anion gap 15 5  - 15    Comment: Performed at [facility], [address], [place], [place] [address]  Magnesium     Status: None   Collection Time: 07/05/19  4:59 AM  Result Value Ref Range   Magnesium 2.1 1.7 - 2.4 mg/dL    Comment: Performed at [facility], [address], [place], [place] [address]  Glucose, capillary     Status: None   Collection Time: 07/05/19  7:34 AM  Result Value Ref Range   Glucose-Capillary 92 70 - 99 mg/dL  Glucose, capillary     Status: Abnormal   Collection Time: 07/05/19 12:07 PM  Result Value Ref Range   Glucose-Capillary 165 (H) 70 - 99 mg/dL  Basic metabolic panel     Status: Abnormal   Collection Time: 07/05/19  1:21 PM  Result Value Ref Range   Sodium 132 (L) 135 - 145 mmol/L   Potassium 3.7 3.5 - 5.1 mmol/L   Chloride 95 (L) 98 - 111 mmol/L   CO2 22 22 - 32 mmol/L   Glucose, Bld 252 (H) 70 - 99 mg/dL   BUN 78 (H) 6 - 20 mg/dL   Creatinine, Ser 6.10 (H) 0.61 - 1.24 mg/dL   Calcium 8.0 (L) 8.9 - 10.3 mg/dL   GFR calc non Af Amer 10 (L) >60 mL/min   GFR calc Af  Amer 11 (L) >60 mL/min   Anion gap 15 5 - 15    Comment: Performed at [facility], [address], [place], [place] [address]  Glucose, capillary     Status: Abnormal   Collection Time: 07/05/19  4:18 PM  Result Value Ref Range   Glucose-Capillary 234 (H) 70 - 99 mg/dL  Glucose, capillary     Status: Abnormal   Collection Time: 07/05/19  9:33 PM  Result Value Ref Range   Glucose-Capillary 258 (H) 70 - 99 mg/dL  Surgical PCR screen     Status: Abnormal   Collection Time: 07/06/19 12:57 AM   Specimen: Nasal Mucosa; Nasal Swab  Result Value Ref Range   MRSA, PCR POSITIVE (A) NEGATIVE   Staphylococcus aureus POSITIVE (A) NEGATIVE    Comment: RESULT CALLED TO, READ BACK BY AND VERIFIED WITH: J RHUE,RN @0419  07/06/19 MKELLY (NOTE) The Xpert SA Assay (FDA approved for NASAL specimens in patients 50 years of age and older), is one component of a comprehensive surveillance program. It is not intended  to diagnose infection nor to guide or monitor treatment. Performed at [facility], [address] [place], [place] [address]   Renal function panel     Status: Abnormal   Collection Time: 07/06/19  5:02 AM  Result Value Ref Range   Sodium 132 (L) 135 - 145 mmol/L   Potassium 3.3 (L) 3.5 - 5.1 mmol/L   Chloride 96 (L) 98 - 111 mmol/L   CO2 19 (L) 22 - 32 mmol/L   Glucose, Bld 147 (H) 70 - 99 mg/dL   BUN 83 (H) 6 - 20 mg/dL   Creatinine, Ser 6.32 (H) 0.61 - 1.24 mg/dL   Calcium 7.7 (L) 8.9 - 10.3 mg/dL   Phosphorus 7.9 (H) 2.5 - 4.6 mg/dL   Albumin 2.3 (L) 3.5 - 5.0 g/dL   GFR calc non Af Amer 9 (L) >60 mL/min   GFR calc Af Amer 11 (L) >60 mL/min   Anion gap 17 (H) 5 - 15    Comment: Performed at [facility], [address], [place],  [address]  CBC     Status: Abnormal   Collection Time: 07/06/19  5:02 AM  Result Value Ref Range   WBC 9.0 4.0 - 10.5 K/uL   RBC 3.96 (L) 4.22 - 5.81 MIL/uL   Hemoglobin 9.5 (L) 13.0 - 17.0 g/dL   HCT 30.9 (L) 39.0 - 52.0 %   MCV 78.0 (L) 80.0 - 100.0 fL   MCH 24.0 (L) 26.0 - 34.0 pg   MCHC 30.7 30.0 - 36.0 g/dL   RDW 15.1 11.5 - 15.5 %   Platelets 352 150 - 400 K/uL   nRBC 0.0 0.0 - 0.2 %    Comment: Performed at [facility], [address] [place], [place] [address]  Glucose, capillary     Status: Abnormal   Collection Time: 07/06/19  8:17 AM  Result Value Ref Range   Glucose-Capillary 123 (H) 70 - 99 mg/dL  Iron and TIBC     Status: Abnormal   Collection Time: 07/06/19  8:40 AM  Result Value Ref Range   Iron 11 (L) 45 - 182 ug/dL   TIBC 278 250 - 450 ug/dL   Saturation Ratios 4 (L) 17.9 - 39.5 %   UIBC 267 ug/dL    Comment: Performed at [facility], [address], [place],  [address]  Ferritin     Status: None   Collection Time: 07/06/19  8:40  AM  Result Value Ref Range   Ferritin 216 24 - 336 ng/mL    Comment: Performed at [facility], [address], [place],  [address]   INR 1.1  CXR 07/03/19 Personally  reviewed- normal volumes, no effusions, no overt signs of fluid overload    Assessment & Plan:  Kelly Key is a 48 y.o. male with ESRD and CHF requiring dialysis access for initiation of dialysis. He now ready to proceed. He does have some left hand swelling after a fall and some tenderness and he does describe some pleurtic chest pain but no chest pain or pressure with deep breathing. He has never had any cardiac event.  -Tunneled dialysis catheter to be placed with fluoroscopy  -Discussed the risk of the procedure including bleeding, infection, foreign body, injury to vessel, pneumothorax, and discussed use of [place] and Fluorsoscopy  -Patient has opted to proceed -Discussed hand swelling and pain with Dr. Joesph Fillers, may need Xray, no leukocytosis and no signs of infection that would preclude placement of catheter -Pleuritic chest pain but no chest pressure or cardiac type pain  All questions were answered to the satisfaction of the patient.  Virl Cagey 07/06/2019, 9:54 AM

## 2019-07-06 NOTE — Progress Notes (Signed)
Physical Therapy Treatment Patient Details Name: Kelly Key MRN: [id number] DOB: 06-06-1971 Today's Date: 07/06/2019    History of Present Illness Kelly Key is a 48 y.o. male with medical history significant of obesity (BMI 49); HTN; HLD: DM; stage 4 CKD; and chronic combined CHF (EF 50-55% in 2013) presenting with increased LE edema, decreased ability to ambulate.  He felt better when he left the hospital.  During the week, his legs got weak and he fell once.  He started developing worsening LE edema.  It has been particularly bad for the last 3 days.  He denies SOB.  His weight has "probably" changed.  He can't lie flat because his feet hurt too much and feel like they weigh too much.  No PND.  No fevers.  He does not want to go to rehab.    PT Comments    Pt sitting EOB with nursing upon entering and wanting to get up into chair.  Pt with increased Lt shoulder and knee pain this session limiting his participation with therapy.  Min-mod assist to stand and take 2 steps to chair.  Unable to use walker as unable to bear weight through Lt UE.  Exercises completed in chair.  Limited extension achieved with Lt LE due to Lt knee pain.  Pt leaving for dialysis port procedure shortly so unable to complete any further activity.             Precautions / Restrictions Precautions Precautions: Fall          Cognition Arousal/Alertness: Awake/alert Behavior During Therapy: WFL for tasks assessed/performed Overall Cognitive Status: Within Functional Limits for tasks assessed                                        Exercises General Exercises - Lower Extremity Ankle Circles/Pumps: AROM;Both;10 reps Long Arc Quad: AROM;Both;10 reps Hip Flexion/Marching: AROM;Both;10 reps        Pertinent Vitals/Pain Pain Assessment: Faces Faces Pain Scale: Hurts even more Pain Location: lt shoulder and Lt knee Pain Descriptors / Indicators: Aching;Sore Pain Intervention(s): Limited activity  within patient's tolerance    Home Living                      Prior Function            PT Goals (current goals can now be found in the care plan section) Acute Rehab PT Goals Potential to Achieve Goals: Good    Frequency    Min 3X/week      PT Plan      Co-evaluation              AM-PAC PT "6 Clicks" Mobility   Outcome Measure  Help needed turning from your back to your side while in a flat bed without using bedrails?: None Help needed moving from lying on your back to sitting on the side of a flat bed without using bedrails?: None Help needed moving to and from a bed to a chair (including a wheelchair)?: A Little Help needed standing up from a chair using your arms (e.g., wheelchair or bedside chair)?: A Little Help needed to walk in hospital room?: A Lot Help needed climbing 3-5 steps with a railing? : A Lot 6 Click Score: 18    End of Session Equipment Utilized During Treatment: Gait belt Activity Tolerance: Patient tolerated treatment well;Patient  limited by fatigue Patient left: with call bell/phone within reach;in chair;with nursing/sitter in room Nurse Communication: Mobility status PT Visit Diagnosis: Unsteadiness on feet (R26.81);Other abnormalities of gait and mobility (R26.89);Muscle weakness (generalized) (M62.81);History of falling (Z91.81)     Time: [id number] PT Time Calculation (min) (ACUTE ONLY): 12 min  Charges:  $Therapeutic Activity: 8-22 mins                       Teena Irani, PTA/CLT [place], Alen Matheson B 07/06/2019, 9:37 AM

## 2019-07-06 NOTE — Progress Notes (Signed)
[facility]  Patient now refusing to get tunneled dialysis catheter. He says he wants to be "completely asleep."  He has been having some left pleuritic type pain but no pressure and no pain when he is not deep breathing.   Anesthesia wants to give him sedation/ narcotics, and minimize anesthesia due to his SOB and left chest pain. I discussed this with the patient and plan for local anesthesia.  He says he does not want to feel anything.  I told him I cannot guarantee this as he will have burning and stick with local and then pressure pain.    I told him I do not know if the other providers at [facility] will offer him anything different as far as sedation/ anesthesia.    I am unable to place the catheter Thursday or Friday.  I have discussed this with Dr. Royce Macadamia and Dr. Denton Brick.   He can have his diet back and go to the floor.   Curlene Labrum, MD [facility] [address] [place], [place] [address] [phone] (office)

## 2019-07-06 NOTE — TOC Progression Note (Signed)
Transition of Care [facility]) - Progression Note    Patient Details  Name: DONNOVAN DELASHMIT MRN: [id number] Date of Birth: [birth year]  Transition of Care [facility]) CM/SW [place], [place] Phone Number: 07/06/2019, 2:28 PM  Clinical Narrative:   Received call from [place].  Confirmed they received Hep B results, but still require Hep B antigen test. Dr Joesph Fillers agreed to order even though patient is scheduled to transfer to [facility].  [facility] also asked to be sent surgeon report of tunnel cath procedure when completed.  FAX number is [id number].    Expected Discharge Plan: [place] Barriers to Discharge: Continued Medical Work up  Expected Discharge Plan and Services Expected Discharge Plan: [place] arrangements for the past 2 months: Single Family Home Expected Discharge Date: 07/05/19                                     Social Determinants of Health (SDOH) Interventions    Readmission Risk Interventions Readmission Risk Prevention Plan 06/27/2019 06/23/2019 02/21/2019  Transportation Screening - Complete Complete  PCP or Specialist Appt within 3-5 Days Complete - Complete  HRI or [place] - Complete Not Complete  HRI or Home Care Consult comments - - not needed, independent, active with [facility]  Social Work Consult for [place] Planning/Counseling - Complete Complete  Palliative Care Screening - Not Applicable Not Applicable  Medication Review Press photographer) - Complete Complete  Some recent data might be hidden

## 2019-07-06 NOTE — Progress Notes (Signed)
[facility] Progress Note  Name: Kelly Key MRN: [id number] DOB: 02/27/1971  Chief Complaint:  Swelling and shortness of breath  Subjective:  His lasix was transitioned to 80 mg IV BID yesterday.  He has been NPO after midnight.  Yesterday he agreed to initiating HD and access placement; this has been a difficult decision for him.  Surgery was consulted for tunneled catheter placement today.  He had 2.4 liters UOP over 9/8 charted.  Bladder scan of zero is charted (ordered PVR).  He lives in [place] and knows of an HD unit near his home - states that someone asked him about this yesterday as well.  I spoke with his fiance on the phone at his request - she is discouraged about the need for HD but has encouraged him to do what he needs to do to live.  He is a little overwhelmed and we discussed HD today and giving him a break tomorrow unless needed for distress then HD again on Friday.   Review of systems:   Patient denies shortness of breath No nausea or vomiting  No dizziness No urinary hesitancy  Reports swelling  ----------------------------- Background on consult:  Kelly Key is an 48 y.o. male with  a PMH significant for dCHF, DM, HTN, HLD, morbid obesity, fatty liver disease, and CKD stage 4 followed by Kelly Key with nephrotic range proteinuria (documented in past as >11 grams/24 hours) who was recently hospitalized on 06/22/2019 at [facility] with a 2 week history of worsening lower extremity edema and abdominal girth not responsive to increasing doses of torsemide as an outpatient. He has had accelerated HTN and workup for renal artery stenosis was underway with Kelly Key however that has not been scheduled. During that hospitalization there were extensive discussions with the pt educating him about the need to initiate dialysis but he refused wanting to give a trial with outpt diuretics.  In the past Kelly Key had expressed that he would "rather die than go on dialysis" and  is followed by Kelly Key, however on further discussion he always states that he's not ready to die. Patient went home with torsemide 60mg  BID and unfortunately did not respond even though he was compliant. His leg swelling became worse with difficult ambulation and dyspnea as well. He denies NSAID use, nausea, CP or fevers.     Intake/Output Summary (Last 24 hours) at 07/06/2019 0745 Last data filed at 07/06/2019 0700 Gross per 24 hour  Intake 1440 ml  Output 2425 ml  Net -985 ml    Vitals:  Vitals:   07/05/19 1946 07/05/19 2131 07/06/19 0515 07/06/19 0735  BP:  140/81 (!) 161/86   Pulse:  86 82   Resp:  20 20   Temp:  98.2 F (36.8 C) 98.3 F (36.8 C)   TempSrc:  Oral Oral   SpO2: 95% 97% 97% 97%  Weight:   (!) 141.6 kg   Height:         Physical Exam:  General adult male in bed in no acute distress  HEENT normocephalic atraumatic extraocular movements intact sclera anicteric Neck supple trachea midline Lungs clear to auscultation bilaterally normal work of breathing at rest; on room air  Heart regular rate and rhythm; no rub appreciated Abdomen soft nontender obese habitus  Extremities tight edema lower extremities Neuro - alert and oriented x 3; provides history GU no foley   Medications reviewed   Labs:  BMP Latest Ref Rng & Units 07/06/2019 07/05/2019  07/05/2019  Glucose 70 - 99 mg/dL 147(H) 252(H) 114(H)  BUN 6 - 20 mg/dL 83(H) 78(H) 75(H)  Creatinine 0.61 - 1.24 mg/dL 6.32(H) 6.10(H) 5.84(H)  Sodium 135 - 145 mmol/L 132(L) 132(L) 134(L)  Potassium 3.5 - 5.1 mmol/L 3.3(L) 3.7 3.2(L)  Chloride 98 - 111 mmol/L 96(L) 95(L) 97(L)  CO2 22 - 32 mmol/L 19(L) 22 22  Calcium 8.9 - 10.3 mg/dL 7.7(L) 8.0(L) 7.9(L)     Assessment/Plan:   # AKI on CKD in setting of decompensated diastolic CHF and escalating diuretic dosage.Felt to have progressed to ESRD  - BL creatinine appears to be in the 3.6-4 range with e/o continued progression since mid 2019.No evidence of  uremia.  Previously has had a difficult time making decision re: goals of care with regard to dialysis.  Recent renal u/s neg for hydro (06/23/19).  Post void negligible (bladder scan of zero charted) - Initiate HD today after tunneled access placement   - Transitioning to HD for volume management - Strict I&O's with floor weights please  - Will initiate search for outpatient HD unit   # Anasarca/acute on chronic diastolic CHF - Started on IV lasix with decent response. His urine prot/creat ratio is markedly elevated at 6.3 mg/g, however albumin is only mildly decreased at 3.1 (consistent with DN and obesity related FSGS). - Transitioning to HD for volume management.  He failed outpatient oral diuretics and worsening azotemia on IV diuretics - Lasix IV once today and tomorrow; nonoliguric  # Malignant HTN- poorly controlled with unrevealing secondary HTN workup but pending MRA of renal arteries. He was seen by Kelly Key who ordered test, will need to see if Radiology can do a non-contrasted MRA of renal arteries with good image quality.  - Felt that therapy of possible RAS would not preclude the need for HD.  Patient likely has very little renal reserve and PTA with contrast if he has significant RAS will almost certainly push him into AKI + HD. - optimize volume status as above  # CKD stage 4- has been progressive due to poorly controlled HTN and DM. Felt that kidney biopsy would not be helpful given chronicity and severity of CKD at this point. Providers have discussed the severity of his CKD as well as stage; have also discussed HD, home HD, PD, and renal transplantation (Kelly Key). Will have him watch educational video tapes regarding dialysis. We also discussed the ways to delay the progression of CKD with blood pressure control, DM management, and optimizing weight.  Given ongoing aggressive diuresis, AKI/CKD, and his unwillingness to consider HD at this time, risks of ACE/ARB outweigh  benefits - Felt to have progressed to ESRD as above  # DM- poorly controlled with Hgb A1c of 12.4%. Management per primary team   # Hypokalemia- replete with potassium 20 meq PO once.  3 K bath for HD; mag acceptable  # Anemia of CKD- check iron stores.  Anticipate need for ESA as well though previously above goal   # Obesity- consider dietary consult to help with DM control and weight loss   # Hyperuricemia- consider allopurinol or uloric per primary team   Kelly Desanctis, MD 07/06/2019 7:45 AM

## 2019-07-06 NOTE — Progress Notes (Signed)
CRITICAL VALUE ALERT  Critical Value:  MRSA & STAPH positive  Date & Time Notied:  07/06/19 0446  Provider Notified: [phone]  Orders Received/Actions taken: protocol orders placed

## 2019-07-06 NOTE — TOC Progression Note (Signed)
Transition of Care [facility]) - Progression Note    Patient Details  Name: EULALIO REAMY MRN: [id number] Date of Birth: 04-Aug-1971  Transition of Care [facility]) CM/SW [place] [place] Phone Number: 07/06/2019, 11:08 AM  Clinical Narrative:   Met with patient yesterday late afternoon to talk about dialysis.  [place], [phone], was visiting and patient gave permission to talk with Ms [name] present.  While she did not answer for him, it was clear that she is encouraging him to pursue dialysis, and he states he is in agreement with this plan.  Mr Wiedemann has been on disability about 10 years now for multiple medical issues.  "One day I fell out at work, and that was the end of me working."  He has no children, and cites his fiance and sister as his main supports.    He lives in [place], and states he would like to get dialysis there.  His fiance knows exactly where the [place] clinic is, and agrees it would be most convenient.  I contacted [facility], they faxed a referral form, and I sent it back, along with requested paperwork.  No Hep B testing available until this AM.  Sent that in as well.  Will follow up with phone call.    Expected Discharge Plan: [place] Barriers to Discharge: Continued Medical Work up  Expected Discharge Plan and Services Expected Discharge Plan: [place] arrangements for the past 2 months: Single Family Home Expected Discharge Date: 07/05/19                                     Social Determinants of Health (SDOH) Interventions    Readmission Risk Interventions Readmission Risk Prevention Plan 06/27/2019 06/23/2019 02/21/2019  Transportation Screening - Complete Complete  PCP or Specialist Appt within 3-5 Days Complete - Complete  HRI or [place] - Complete Not Complete  HRI or Home Care Consult comments - - not needed, independent, active with [facility]  Social Work Consult for [place] Planning/Counseling - Complete Complete  Palliative Care Screening - Not Applicable Not Applicable  Medication Review [employer]) - Complete Complete  Some recent data might be hidden

## 2019-07-06 NOTE — Progress Notes (Signed)
Patient Demographics:    Kelly Key, is a 48 y.o. male, DOB - May 07, 1971, [id number]  Admit date - 07/03/2019   Admitting Physician Karmen Bongo, MD  Outpatient Primary MD for the patient is Kelly Gravel, MD  LOS - 1   Chief Complaint  Patient presents with   Leg Swelling        Subjective:    Ludwig Lean today has no fevers, no emesis,  No chest pain,   --No shortness of breath at rest, but has dyspnea on exertion -Voiding okay  Assessment  & Plan :    Principal Problem:   Anasarca Active Problems:   HTN (hypertension)   Morbid obesity ([place])   CKD (chronic kidney disease) stage 5, GFR less than 15 ml/min ([place])   Uncontrolled type 2 diabetes mellitus with hyperglycemia, with long-term current use of insulin (H[facility])   Dyslipidemia   Chronic combined systolic and diastolic CHF (congestive heart failure) (H[facility])   Volume overload   Congestive heart failure ([place])  Brief Summary 48 y.o. male with medical history significant of obesity (BMI 49); HTN; HLD: DM; stage 4 CKD; and chronic combined CHF (EF 50-55% in 2013) presenting with increased LE edema, decreased ability readmitted on 07/03/2019 with anasarca in the setting of dCHF exacerbation- -awaiting transfer to [facility] for tunneled HD catheter placement prior to initiation of hemodialysis  A/p 1)Anasarca in the setting of HFpEF--last known EF per echo from 06/23/2019 is 50 to 55%, -patient with history of chronic low EF CHF--- presenting with acute on chronic preserved EF CHF exacerbation, -PTA patient was on torsemide 60 mg twice daily -Nephrologist has changed IV   IV Lasix 80 mg twice daily  --awaiting transfer to [facility] for tunneled HD catheter placement prior to initiation of hemodialysis -Patient will stay at [facility] by nephrology service for management of his volume status with HD  2)HTN--- work-up for  possible renal artery stenosis including MRI of renal arteries was discussed  By Dr Oneida Alar,  -He is scheduled for an MRA of his abdomen on 9/18 for evaluation of RAS, but this may be less meaningful if the patient is transitioning to HD -Stable, continue Coreg 25 mg twice daily, clonidine 0.3 mg 3 times daily,  3)CKD IV--renal function continues to worsen, progressive CKD, -Prior renal ultrasound without obstructive uropathy -Nephrology service discussed option of dialysis with patient to better address his volume status -His urineprot/creat ratio is markedly elevated at 6.3 mg/g, however albumin is only mildly decreased at 3.1 (consistent with DN and obesity related FSGS). -Continue sodium bicarbonate 650 twice daily --awaiting tunneled HD catheter placement prior to initiation of hemodialysis  4)Anemia of CKD superimposed on iron deficiency anemia----stable, hemoglobin  9.5, we will  defer decision on EPO agent to nephrology service -Serum iron is 11, iron saturation 4  5)DM2-last A1c 12.4, okay to continue Levemir 35 units twice daily, along with sliding scale coverage  6) intermittent chest discomfort/dyspnea on exertion--- not a candidate for CTA chest due to renal function, chronic lower extremity edema is most likely due to renal and heart failure-patient had negative venous Dopplers of the lower extremity on 02/21/2019, also had repeat negative lower extremity Dopplers on 04/28/2019, lower extremity venous Dopplers  were again negative  on 8/26/202- -continue DVT prophylaxis, low clinical index of suspicion for PE at this time--  Disposition/Need for in-Hospital Stay- patient unable to be discharged at this time due to --anasarca requiring aggressive IV diuresis - -awaiting transfer to [facility] for tunneled HD catheter placement prior to initiation of hemodialysis  Code Status : FULL  Family Communication:   (patient is alert, awake and coherent) Discussed with patient, his sister  Shirlean Mylar and his girlfriend at bedside at patient request  Disposition Plan  : Transfer to [facility] complex for tunneled HD catheter placement and initiation of HD Consults  :  Nephrology/general surgery  Procedures- - tunneled HD catheter placement planned for 07/07/2019 prior to initiation of hemodialysis   DVT Prophylaxis  :   - Heparin - SCDs  Lab Results  Component Value Date   PLT 352 07/06/2019    Inpatient Medications  Scheduled Meds:  allopurinol  200 mg Oral Daily   aspirin EC  81 mg Oral Daily   atorvastatin  80 mg Oral Daily   carvedilol  25 mg Oral BID WC   Chlorhexidine Gluconate Cloth  6 each Topical Q0600   cloNIDine  0.3 mg Oral TID   ezetimibe  10 mg Oral QPM   fluticasone  1 spray Each Nare Daily   furosemide  80 mg Intravenous Daily   gabapentin  100 mg Oral TID   heparin  5,000 Units Subcutaneous Q8H   insulin aspart  0-20 Units Subcutaneous TID WC   insulin aspart  0-5 Units Subcutaneous QHS   insulin detemir  35 Units Subcutaneous BID   linaclotide  145 mcg Oral QAC breakfast   mupirocin ointment  1 application Nasal BID   potassium chloride  20 mEq Oral Once   sodium chloride flush  3 mL Intravenous Q12H   Continuous Infusions:  sodium chloride     PRN Meds:.sodium chloride, acetaminophen, albuterol, calcium carbonate (dosed in mg elemental calcium), camphor-menthol **AND** hydrOXYzine, docusate sodium, feeding supplement (NEPRO CARB STEADY), HYDROcodone-acetaminophen, ondansetron (ZOFRAN) IV, sodium chloride flush, sorbitol, zolpidem    Anti-infectives (From admission, onward)   Start     Dose/Rate Route Frequency Ordered Stop   07/06/19 1045  ceFAZolin (ANCEF) 3 g in dextrose 5 % 50 mL IVPB  Status:  Discontinued     3 g 100 mL/hr over 30 Minutes Intravenous On call to O.R. 07/06/19 1037 07/06/19 1231        Objective:   Vitals:   07/06/19 0515 07/06/19 0735 07/06/19 1029 07/06/19 1244  BP: (!) 161/86  (!) 177/96 (!)  141/101  Pulse: 82  84 93  Resp: 20  20 19   Temp: 98.3 F (36.8 C)  98.2 F (36.8 C)   TempSrc: Oral  Oral   SpO2: 97% 97% 95% 97%  Weight: (!) 141.6 kg     Height:        Wt Readings from Last 3 Encounters:  07/06/19 (!) 141.6 kg  06/27/19 (!) 141.6 kg  06/16/19 (!) 138.8 kg     Intake/Output Summary (Last 24 hours) at 07/06/2019 1801 Last data filed at 07/06/2019 0900 Gross per 24 hour  Intake 720 ml  Output 1900 ml  Net -1180 ml     Physical Exam  Gen:- Awake Alert, morbidly obese, dyspnea on exertion HEENT:- [place].AT, No sclera icterus Neck-Supple Neck,No JVD,.  Nose- 3 L/min Breast-gynecomastia Lungs-diminished in bases with bibasilar rales  CV- S1, S2 normal, regular ,  Abd-  +ve  B.Sounds, Abd Soft, increased truncal adiposity extremity/Skin: 3 + pitting edema/anasarca, pedal pulses present  Psych-affect is appropriate, oriented x3 Neuro-generalized weakness, no new focal deficits, no tremors   Data Review:   Micro Results Recent Results (from the past 240 hour(s))  SARS Coronavirus 2 [facility] order, Performed in [facility] hospital lab) Nasopharyngeal Nasopharyngeal Swab     Status: None   Collection Time: 07/03/19  4:08 PM   Specimen: Nasopharyngeal Swab  Result Value Ref Range Status   SARS Coronavirus 2 NEGATIVE NEGATIVE Final    Comment: (NOTE) If result is NEGATIVE SARS-CoV-2 target nucleic acids are NOT DETECTED. The SARS-CoV-2 RNA is generally detectable in upper and lower  respiratory specimens during the acute phase of infection. The lowest  concentration of SARS-CoV-2 viral copies this assay can detect is 250  copies / mL. A negative result does not preclude SARS-CoV-2 infection  and should not be used as the sole basis for treatment or other  patient management decisions.  A negative result may occur with  improper specimen collection / handling, submission of specimen other  than nasopharyngeal swab, presence of viral mutation(s) within the    areas targeted by this assay, and inadequate number of viral copies  (<250 copies / mL). A negative result must be combined with clinical  observations, patient history, and epidemiological information. If result is POSITIVE SARS-CoV-2 target nucleic acids are DETECTED. The SARS-CoV-2 RNA is generally detectable in upper and lower  respiratory specimens dur ing the acute phase of infection.  Positive  results are indicative of active infection with SARS-CoV-2.  Clinical  correlation with patient history and other diagnostic information is  necessary to determine patient infection status.  Positive results do  not rule out bacterial infection or co-infection with other viruses. If result is PRESUMPTIVE POSTIVE SARS-CoV-2 nucleic acids MAY BE PRESENT.   A presumptive positive result was obtained on the submitted specimen  and confirmed on repeat testing.  While 2019 novel coronavirus  (SARS-CoV-2) nucleic acids may be present in the submitted sample  additional confirmatory testing may be necessary for epidemiological  and / or clinical management purposes  to differentiate between  SARS-CoV-2 and other Sarbecovirus currently known to infect humans.  If clinically indicated additional testing with an alternate test  methodology [phone]) is advised. The SARS-CoV-2 RNA is generally  detectable in upper and lower respiratory sp ecimens during the acute  phase of infection. The expected result is Negative. Fact Sheet for Patients:  StrictlyIdeas.no Fact Sheet for Healthcare Providers: BankingDealers.co.za This test is not yet approved or cleared by the [place] FDA and has been authorized for detection and/or diagnosis of SARS-CoV-2 by FDA under an Emergency Use Authorization (EUA).  This EUA will remain in effect (meaning this test can be used) for the duration of the COVID-19 declaration under Section 564(b)(1) of the Act, 21  U.S.C. section 360bbb-3(b)(1), unless the authorization is terminated or revoked sooner. Performed at [facility], [address], [place], [place] [address]   Surgical PCR screen     Status: Abnormal   Collection Time: 07/06/19 12:57 AM   Specimen: Nasal Mucosa; Nasal Swab  Result Value Ref Range Status   MRSA, PCR POSITIVE (A) NEGATIVE Final   Staphylococcus aureus POSITIVE (A) NEGATIVE Final    Comment: RESULT CALLED TO, READ BACK BY AND VERIFIED WITH: J RHUE,RN @0419  07/06/19 MKELLY (NOTE) The Xpert SA Assay (FDA approved for NASAL specimens in patients 47 years of age and older), is one  component of a comprehensive surveillance program. It is not intended to diagnose infection nor to guide or monitor treatment. Performed at [facility], [address] [place], [place] [address]     Radiology Reports Dg Chest 2 View  Result Date: 07/03/2019 CLINICAL DATA:  History of end-stage renal disease and CHF. EXAM: CHEST - 2 VIEW COMPARISON:  Chest radiograph 06/22/2019, 04/28/2019 FINDINGS: Stable cardiomediastinal contours with enlarged heart size. A few minimal linear opacities at the right lung base likely reflect atelectasis or scar. No new focal infiltrate or evidence of edema. No pneumothorax or pleural effusion. No acute findings in the visualized skeleton. IMPRESSION: No evidence of active disease. Electronically Signed   By: Audie Pinto M.D.   On: 07/03/2019 13:57   US Renal  Result Date: 06/23/2019 CLINICAL DATA:  Acute renal failure EXAM: RENAL / URINARY TRACT ULTRASOUND COMPLETE COMPARISON:  None. FINDINGS: Right Kidney: Renal measurements: 11.7 x 4.8 x 6.1 cm = volume: 177 mL . Echogenicity within normal limits. No mass or hydronephrosis visualized. Left Kidney: Renal measurements: 11.1 x 5.3 x 5.9 cm = volume: 181 mL. Echogenicity within normal limits. No mass or hydronephrosis visualized. Bladder: Appears normal for degree of bladder distention. IMPRESSION: Normal  bilateral renal ultrasound. Electronically Signed   By: Kathreen Devoid   On: 06/23/2019 11:46   US Venous Img Lower Bilateral  Result Date: 06/22/2019 CLINICAL DATA:  Bilateral lower extremity pain and edema. Evaluate for DVT. EXAM: BILATERAL LOWER EXTREMITY VENOUS DOPPLER ULTRASOUND TECHNIQUE: Gray-scale sonography with graded compression, as well as color Doppler and duplex ultrasound were performed to evaluate the lower extremity deep venous systems from the level of the common femoral vein and including the common femoral, femoral, profunda femoral, popliteal and calf veins including the posterior tibial, peroneal and gastrocnemius veins when visible. The superficial great saphenous vein was also interrogated. Spectral Doppler was utilized to evaluate flow at rest and with distal augmentation maneuvers in the common femoral, femoral and popliteal veins. COMPARISON:  None. FINDINGS: RIGHT LOWER EXTREMITY Common Femoral Vein: No evidence of thrombus. Normal compressibility, respiratory phasicity and response to augmentation. Saphenofemoral Junction: No evidence of thrombus. Normal compressibility and flow on color Doppler imaging. Profunda Femoral Vein: No evidence of thrombus. Normal compressibility and flow on color Doppler imaging. Femoral Vein: No evidence of thrombus. Normal compressibility, respiratory phasicity and response to augmentation. Popliteal Vein: No evidence of thrombus. Normal compressibility, respiratory phasicity and response to augmentation. Calf Veins: No evidence of thrombus. Normal compressibility and flow on color Doppler imaging. Superficial Great Saphenous Vein: No evidence of thrombus. Normal compressibility. Venous Reflux:  None. Other Findings:  None. LEFT LOWER EXTREMITY Common Femoral Vein: No evidence of thrombus. Normal compressibility, respiratory phasicity and response to augmentation. Saphenofemoral Junction: No evidence of thrombus. Normal compressibility and flow on color  Doppler imaging. Profunda Femoral Vein: No evidence of thrombus. Normal compressibility and flow on color Doppler imaging. Femoral Vein: No evidence of thrombus. Normal compressibility, respiratory phasicity and response to augmentation. Popliteal Vein: No evidence of thrombus. Normal compressibility, respiratory phasicity and response to augmentation. Calf Veins: No evidence of thrombus. Normal compressibility and flow on color Doppler imaging. Superficial Great Saphenous Vein: No evidence of thrombus. Normal compressibility. Venous Reflux:  None. Other Findings:  None. IMPRESSION: No evidence of DVT within either lower extremity. Electronically Signed   By: Sandi Mariscal M.D.   On: 06/22/2019 14:52   Dg Chest Port 1 View  Result Date: 06/22/2019 CLINICAL DATA:  Pt c/o swelling  in lower extremities x several days. Hx CHF, obesity, diabetes, nonsmoker EXAM: PORTABLE CHEST 1 VIEW COMPARISON:  Chest radiographs dated 04/28/2019, 02/23/2019 FINDINGS: Stable cardiomediastinal contours with enlarged heart size. Central vascular congestion without overt edema. No pneumothorax or pleural effusion. Visualized skeleton is unremarkable. IMPRESSION: Cardiomegaly and central vascular congestion without overt edema. Electronically Signed   By: Audie Pinto M.D.   On: 06/22/2019 10:53   Vas US Renal Artery Duplex  Result Date: 06/16/2019 ABDOMINAL VISCERAL High Risk Factors: Hypertension, hyperlipidemia, Diabetes, no history of                    smoking. Other Factors: Limited study secondary to obesity and bowel gas. Limitations: Air/bowel gas and obesity. Performing Technologist: Delorise Shiner RVT  Examination Guidelines: A complete evaluation includes B-mode imaging, spectral Doppler, color Doppler, and power Doppler as needed of all accessible portions of each vessel. Bilateral testing is considered an integral part of a complete examination. Limited examinations for reoccurring indications may be performed as  noted.  Duplex Findings: +----------+--------+--------+------+--------+  Mesenteric PSV cm/s EDV cm/s Plaque Comments  +----------+--------+--------+------+--------+  Aorta Mid     58                              +----------+--------+--------+------+--------+  +------------------+--------+--------+-------+  Right Renal Artery PSV cm/s EDV cm/s Comment  +------------------+--------+--------+-------+  Proximal              25                      +------------------+--------+--------+-------+  Mid                   26                      +------------------+--------+--------+-------+  Distal                27                      +------------------+--------+--------+-------+ +-----------------+--------+--------+-------+  Left Renal Artery PSV cm/s EDV cm/s Comment  +-----------------+--------+--------+-------+  Origin               40                      +-----------------+--------+--------+-------+  Proximal             29                      +-----------------+--------+--------+-------+  Mid                  27                      +-----------------+--------+--------+-------+  Distal               23                      +-----------------+--------+--------+-------+ +------------+--------+--------+--+-----------+--------+--------+---+  Right Kidney PSV cm/s EDV cm/s RI Left Kidney PSV cm/s EDV cm/s RI   +------------+--------+--------+--+-----------+--------+--------+---+  Upper Pole                        Upper Pole                         +------------+--------+--------+--+-----------+--------+--------+---+  Mid                               Mid                                +------------+--------+--------+--+-----------+--------+--------+---+  Lower Pole                        Lower Pole                         +------------+--------+--------+--+-----------+--------+--------+---+  Hilar                             Hilar                               +------------+--------+--------+--+-----------+--------+--------+---+ +------------------+-------+------------------+-------+  Right Kidney               Left Kidney                 +------------------+-------+------------------+-------+  RAR                        RAR                         +------------------+-------+------------------+-------+  RAR (manual)               RAR (manual)                +------------------+-------+------------------+-------+  Cortex                     Cortex                      +------------------+-------+------------------+-------+  Cortex thickness   1.88 mm Corex thickness    1.84 mm  +------------------+-------+------------------+-------+  Kidney length (cm) 13.27   Kidney length (cm) 10.24    +------------------+-------+------------------+-------+  Summary: Renal:  Right: Normal size right kidney. No evidence of right renal artery        stenosis. RRV flow present. Limited study secondary to body        habitus. Could not insonnate right renal artery origin. Left:  Normal size of left kidney. No evidence of left renal artery        stenosis. LRV flow present. Limited study secondary to body        habitus.  *See table(s) above for measurements and observations.  Diagnosing physician: Ruta Hinds MD  Electronically signed by Ruta Hinds MD on 06/16/2019 at 10:43:24 AM.    Final      CBC Recent Labs  Lab 07/03/19 1318 07/04/19 0449 07/06/19 0502  WBC 10.9* 8.8 9.0  HGB 11.6* 11.1* 9.5*  HCT 36.7* 35.4* 30.9*  PLT 334 314 352  MCV 77.4* 78.1* 78.0*  MCH 24.5* 24.5* 24.0*  MCHC 31.6 31.4 30.7  RDW 15.3 15.1 15.1  LYMPHSABS  --  1.9  --   MONOABS  --  1.1*  --   EOSABS  --  0.2  --   BASOSABS  --  0.1  --     Chemistries  Recent Labs  Lab 07/03/19 1318 07/04/19 0449 07/05/19 0459 07/05/19 1321  07/06/19 0502  NA 133* 135 134* 132* 132*  K 3.5 3.4* 3.2* 3.7 3.3*  CL 97* 98 97* 95* 96*  CO2 22 25 22 22  19*  GLUCOSE 243* 203* 114* 252* 147*  BUN  65* 66* 75* 78* 83*  CREATININE 5.15* 5.27* 5.84* 6.10* 6.32*  CALCIUM 8.0* 8.0* 7.9* 8.0* 7.7*  MG  --   --  2.1  --   --    ------------------------------------------------------------------------------------------------------------------ No results for input(s): CHOL, HDL, LDLCALC, TRIG, CHOLHDL, LDLDIRECT in the last 72 hours.  Lab Results  Component Value Date   HGBA1C 12.1 (H) 06/23/2019   ------------------------------------------------------------------------------------------------------------------ No results for input(s): TSH, T4TOTAL, T3FREE, THYROIDAB in the last 72 hours.  Invalid input(s): FREET3 ------------------------------------------------------------------------------------------------------------------ Recent Labs    07/06/19 0840  FERRITIN 216  TIBC 278  IRON 11*    Coagulation profile Recent Labs  Lab 07/03/19 1318  INR 1.1    No results for input(s): DDIMER in the last 72 hours.  Cardiac Enzymes No results for input(s): CKMB, TROPONINI, MYOGLOBIN in the last 168 hours.  Invalid input(s): CK ------------------------------------------------------------------------------------------------------------------    Component Value Date/Time   BNP 119.0 (H) 06/22/2019 1053    Roxan Hockey M.D on 07/06/2019 at 6:01 PM  Go to [email or web address] - for contact info  [facility] - Office  [phone]

## 2019-07-07 ENCOUNTER — Inpatient Hospital Stay (HOSPITAL_COMMUNITY): Payer: Medicare Other

## 2019-07-07 LAB — CBC
HCT: 30.8 % — ABNORMAL LOW (ref 39.0–52.0)
Hemoglobin: 9.8 g/dL — ABNORMAL LOW (ref 13.0–17.0)
MCH: 24.7 pg — ABNORMAL LOW (ref 26.0–34.0)
MCHC: 31.8 g/dL (ref 30.0–36.0)
MCV: 77.8 fL — ABNORMAL LOW (ref 80.0–100.0)
Platelets: 398 10*3/uL (ref 150–400)
RBC: 3.96 MIL/uL — ABNORMAL LOW (ref 4.22–5.81)
RDW: 15.5 % (ref 11.5–15.5)
WBC: 8.8 10*3/uL (ref 4.0–10.5)
nRBC: 0 % (ref 0.0–0.2)

## 2019-07-07 LAB — RENAL FUNCTION PANEL
Albumin: 2.3 g/dL — ABNORMAL LOW (ref 3.5–5.0)
Anion gap: 15 (ref 5–15)
BUN: 88 mg/dL — ABNORMAL HIGH (ref 6–20)
CO2: 21 mmol/L — ABNORMAL LOW (ref 22–32)
Calcium: 8.1 mg/dL — ABNORMAL LOW (ref 8.9–10.3)
Chloride: 96 mmol/L — ABNORMAL LOW (ref 98–111)
Creatinine, Ser: 6.56 mg/dL — ABNORMAL HIGH (ref 0.61–1.24)
GFR calc Af Amer: 10 mL/min — ABNORMAL LOW (ref >60)
GFR calc non Af Amer: 9 mL/min — ABNORMAL LOW (ref >60)
Glucose, Bld: 197 mg/dL — ABNORMAL HIGH (ref 70–99)
Phosphorus: 8.7 mg/dL — ABNORMAL HIGH (ref 2.5–4.6)
Potassium: 3.5 mmol/L (ref 3.5–5.1)
Sodium: 132 mmol/L — ABNORMAL LOW (ref 135–145)

## 2019-07-07 LAB — GLUCOSE, CAPILLARY
Glucose-Capillary: 174 mg/dL — ABNORMAL HIGH (ref 70–99)
Glucose-Capillary: 155 mg/dL — ABNORMAL HIGH (ref 70–99)
Glucose-Capillary: 183 mg/dL — ABNORMAL HIGH (ref 70–99)
Glucose-Capillary: 294 mg/dL — ABNORMAL HIGH (ref 70–99)

## 2019-07-07 MED ORDER — INSULIN DETEMIR 100 UNIT/ML ~~LOC~~ SOLN
35.0000 [IU] | Freq: Two times a day (BID) | SUBCUTANEOUS | Status: DC
  Administered 2019-07-08 – 2019-07-16 (×16): 35 [IU] via SUBCUTANEOUS
  Filled 2019-07-07 (×18): qty 0.35

## 2019-07-07 MED ORDER — POTASSIUM CHLORIDE CRYS ER 20 MEQ PO TBCR
20.0000 meq | EXTENDED_RELEASE_TABLET | Freq: Once | ORAL | Status: AC
  Administered 2019-07-07: 20 meq via ORAL
  Filled 2019-07-07: qty 1

## 2019-07-07 MED ORDER — FUROSEMIDE 10 MG/ML IJ SOLN
80.0000 mg | Freq: Two times a day (BID) | INTRAMUSCULAR | Status: DC
  Administered 2019-07-07 – 2019-07-15 (×15): 80 mg via INTRAVENOUS
  Filled 2019-07-07 (×16): qty 8

## 2019-07-07 MED ORDER — FUROSEMIDE 10 MG/ML IJ SOLN
100.0000 mg | Freq: Two times a day (BID) | INTRAVENOUS | Status: DC
  Administered 2019-07-07: 100 mg via INTRAVENOUS
  Filled 2019-07-07 (×3): qty 10

## 2019-07-07 MED ORDER — FUROSEMIDE 10 MG/ML IJ SOLN: 80.0000 mg | Freq: Two times a day (BID) | INTRAMUSCULAR | Status: DC

## 2019-07-07 MED ORDER — SODIUM CHLORIDE 0.9 % IV SOLN
510.0000 mg | Freq: Once | INTRAVENOUS | Status: AC
  Administered 2019-07-07: 510 mg via INTRAVENOUS
  Filled 2019-07-07: qty 17

## 2019-07-07 MED ORDER — METOLAZONE 5 MG PO TABS
5.0000 mg | ORAL_TABLET | Freq: Once | ORAL | Status: AC
  Administered 2019-07-07: 5 mg via ORAL
  Filled 2019-07-07: qty 1

## 2019-07-07 MED ORDER — FUROSEMIDE 10 MG/ML IJ SOLN
100.0000 mg | Freq: Two times a day (BID) | INTRAVENOUS | Status: DC
  Filled 2019-07-07 (×2): qty 10

## 2019-07-07 MED ORDER — INSULIN DETEMIR 100 UNIT/ML ~~LOC~~ SOLN
10.0000 [IU] | Freq: Once | SUBCUTANEOUS | Status: AC
  Administered 2019-07-07: 10 [IU] via SUBCUTANEOUS
  Filled 2019-07-07 (×2): qty 0.1

## 2019-07-07 NOTE — Progress Notes (Signed)
Transfer Note:   Traveling Method: [facility] Transferring Unit: [employer] [facility] Mental Orientation: A&O X4 Telemetry: 63M11 Assessment: WDL Skin: WDL IV: WDL Pain: Denies Safety Measures: Safety Fall Prevention Plan has been given, discussed and signed Admission: Completed 61M Orientation: Patient has been orientated to the room, unit and staff.   Orders have been reviewed and implemented. Will continue to monitor the patient. Call light has been placed within reach and bed alarm has been activated.   Aneta Mins BSN, RN3

## 2019-07-07 NOTE — Progress Notes (Signed)
Patient Demographics:    Kelly Key, is a 48 y.o. male, DOB - [birth year], DP:[id number]  Admit date - 07/03/2019   Admitting Physician Karmen Bongo, MD  Outpatient Primary MD for the patient is Kelly Gravel, MD  LOS - 2   Chief Complaint  Patient presents with   Leg Swelling        Subjective:    Ludwig Lean today has no fevers, no emesis,  No chest pain,   -Less short of breath, dyspnea on exertion less severe  Assessment  & Plan :    Principal Problem:   Anasarca Active Problems:   HTN (hypertension)   Morbid obesity ([place])   CKD (chronic kidney disease) stage 5, GFR less than 15 ml/min ([place])   Uncontrolled type 2 diabetes mellitus with hyperglycemia, with long-term current use of insulin (H[facility])   Dyslipidemia   Chronic combined systolic and diastolic CHF (congestive heart failure) (H[facility])   Volume overload   Congestive heart failure ([place])  Brief Summary 48 y.o. male with medical history significant of obesity (BMI 49); HTN; HLD: DM; stage 4 CKD; and chronic combined CHF (EF 50-55% in 2013) presenting with increased LE edema, decreased ability readmitted on 07/03/2019 with anasarca in the setting of dCHF exacerbation- -awaiting transfer to [place] for tunneled HD catheter placement prior to initiation of hemodialysis  A/p 1)Anasarca in the setting of HFpEF--last known EF per echo from 06/23/2019 is 50 to 55%, -patient with history of chronic low EF CHF--- presenting with acute on chronic preserved EF CHF exacerbation, -PTA patient was on torsemide 60 mg twice daily -Continue IV   IV Lasix 80 mg twice daily by nephrologist --awaiting transfer to [facility] for tunneled HD catheter placement prior to initiation of hemodialysis -Patient will stay at [facility] by nephrology service for management of his volume status with HD  2)HTN--- work-up for possible renal artery  stenosis including MRI of renal arteries was discussed  By Dr Oneida Alar,  -He is scheduled for an MRA of his abdomen on 07/15/19 for evaluation of RAS, but this may be less meaningful if the patient is transitioning to HD -Stable, continue Coreg 25 mg twice daily, clonidine 0.3 mg 3 times daily,  3)CKD IV--renal function continues to worsen, progressive CKD, -Prior renal ultrasound without obstructive uropathy -Nephrology service discussed option of dialysis with patient to better address his volume status -His urineprot/creat ratio is markedly elevated at 6.3 mg/g, however albumin is only mildly decreased at 3.1 (consistent with DN and obesity related FSGS). -Continue sodium bicarbonate 650 twice daily --awaiting tunneled HD catheter placement prior to initiation of hemodialysis  4)Anemia of CKD superimposed on iron deficiency anemia----stable, hemoglobin  9.8, we will  defer decision on EPO agent to nephrology service -Serum iron is 11, iron saturation 4  5)DM2-last A1c 12.4, reflecting uncontrolled diabetes -Patient will be n.p.o. to allow for HD tunneled catheter placement on 07/08/2019, so Hold Levemir 35 units twice daily until 07/08/2019 p.m.-give only 10 units of Levemir x1 on 07/07/2019 p.m.,  -c/n with sliding scale coverage  6) intermittent chest discomfort/dyspnea on exertion--- not a candidate for CTA chest due to renal function, chronic lower extremity edema is most likely due to renal and heart failure-patient  had negative venous Dopplers of the lower extremity on 02/21/2019, also had repeat negative lower extremity Dopplers on 04/28/2019, lower extremity venous Dopplers were again negative  on 8/26/202- -continue DVT prophylaxis, low clinical index of suspicion for PE at this time--  Disposition/Need for in-Hospital Stay- patient unable to be discharged at this time due to --anasarca with persistent dyspnea on exertion requiring aggressive IV diuresis - -awaiting transfer to [facility]  for tunneled HD catheter placement prior to initiation of hemodialysis  Code Status : FULL  Family Communication:   (patient is alert, awake and coherent) Discussed with patient, his sister Shirlean Mylar and his girlfriend at bedside at patient request  Disposition Plan  : Transfer to [facility] complex for tunneled HD catheter placement and initiation of HD Consults  :  Nephrology/general surgery  Procedures- - tunneled HD catheter placement planned for 07/07/2019 prior to initiation of hemodialysis   DVT Prophylaxis  :   - Heparin - SCDs  Lab Results  Component Value Date   PLT 398 07/07/2019    Inpatient Medications  Scheduled Meds:  allopurinol  200 mg Oral Daily   aspirin EC  81 mg Oral Daily   atorvastatin  80 mg Oral Daily   carvedilol  25 mg Oral BID WC   Chlorhexidine Gluconate Cloth  6 each Topical Q0600   cloNIDine  0.3 mg Oral TID   ezetimibe  10 mg Oral QPM   fluticasone  1 spray Each Nare Daily   furosemide  80 mg Intravenous BID   gabapentin  100 mg Oral TID   heparin  5,000 Units Subcutaneous Q8H   insulin aspart  0-20 Units Subcutaneous TID WC   insulin aspart  0-5 Units Subcutaneous QHS   insulin detemir  35 Units Subcutaneous BID   linaclotide  145 mcg Oral QAC breakfast   mupirocin ointment  1 application Nasal BID   potassium chloride  20 mEq Oral Once   sodium chloride flush  3 mL Intravenous Q12H   Continuous Infusions:  sodium chloride     PRN Meds:.sodium chloride, acetaminophen, albuterol, calcium carbonate (dosed in mg elemental calcium), camphor-menthol **AND** hydrOXYzine, docusate sodium, feeding supplement (NEPRO CARB STEADY), HYDROcodone-acetaminophen, ondansetron (ZOFRAN) IV, sodium chloride flush, sorbitol    Anti-infectives (From admission, onward)   Start     Dose/Rate Route Frequency Ordered Stop   07/06/19 1045  ceFAZolin (ANCEF) 3 g in dextrose 5 % 50 mL IVPB  Status:  Discontinued     3 g 100 mL/hr over 30 Minutes  Intravenous On call to O.R. 07/06/19 1037 07/06/19 1231        Objective:   Vitals:   07/06/19 2120 07/07/19 0503 07/07/19 0800 07/07/19 0958  BP: (!) 172/94 138/84  (!) 156/91  Pulse: 98 80  88  Resp: 17 16  16   Temp: 99 F (37.2 C) 98.5 F (36.9 C)  97.9 F (36.6 C)  TempSrc: Oral Oral  Oral  SpO2: 96% 94% 96% 96%  Weight:  (!) 141.9 kg    Height:        Wt Readings from Last 3 Encounters:  07/07/19 (!) 141.9 kg  06/27/19 (!) 141.6 kg  06/16/19 (!) 138.8 kg     Intake/Output Summary (Last 24 hours) at 07/07/2019 1635 Last data filed at 07/07/2019 1611 Gross per 24 hour  Intake 173.23 ml  Output 2000 ml  Net -1826.77 ml     Physical Exam  Gen:- Awake Alert, morbidly obese, dyspnea on exertion HEENT:-  [place].AT, No sclera icterus Neck-Supple Neck,No JVD,.  Nose- 2 L/min Breast-gynecomastia Lungs-diminished in bases with bibasilar rales  CV- S1, S2 normal, regular ,  Abd-  +ve B.Sounds, Abd Soft, increased truncal adiposity extremity/Skin: 3 + pitting edema/anasarca, pedal pulses present  Psych-affect is appropriate, oriented x3 Neuro-generalized weakness, no new focal deficits, no tremors   Data Review:   Micro Results Recent Results (from the past 240 hour(s))  SARS Coronavirus 2 [facility] order, Performed in [facility] hospital lab) Nasopharyngeal Nasopharyngeal Swab     Status: None   Collection Time: 07/03/19  4:08 PM   Specimen: Nasopharyngeal Swab  Result Value Ref Range Status   SARS Coronavirus 2 NEGATIVE NEGATIVE Final    Comment: (NOTE) If result is NEGATIVE SARS-CoV-2 target nucleic acids are NOT DETECTED. The SARS-CoV-2 RNA is generally detectable in upper and lower  respiratory specimens during the acute phase of infection. The lowest  concentration of SARS-CoV-2 viral copies this assay can detect is 250  copies / mL. A negative result does not preclude SARS-CoV-2 infection  and should not be used as the sole basis for treatment or other    patient management decisions.  A negative result may occur with  improper specimen collection / handling, submission of specimen other  than nasopharyngeal swab, presence of viral mutation(s) within the  areas targeted by this assay, and inadequate number of viral copies  (<250 copies / mL). A negative result must be combined with clinical  observations, patient history, and epidemiological information. If result is POSITIVE SARS-CoV-2 target nucleic acids are DETECTED. The SARS-CoV-2 RNA is generally detectable in upper and lower  respiratory specimens dur ing the acute phase of infection.  Positive  results are indicative of active infection with SARS-CoV-2.  Clinical  correlation with patient history and other diagnostic information is  necessary to determine patient infection status.  Positive results do  not rule out bacterial infection or co-infection with other viruses. If result is PRESUMPTIVE POSTIVE SARS-CoV-2 nucleic acids MAY BE PRESENT.   A presumptive positive result was obtained on the submitted specimen  and confirmed on repeat testing.  While 2019 novel coronavirus  (SARS-CoV-2) nucleic acids may be present in the submitted sample  additional confirmatory testing may be necessary for epidemiological  and / or clinical management purposes  to differentiate between  SARS-CoV-2 and other Sarbecovirus currently known to infect humans.  If clinically indicated additional testing with an alternate test  methodology [phone]) is advised. The SARS-CoV-2 RNA is generally  detectable in upper and lower respiratory sp ecimens during the acute  phase of infection. The expected result is Negative. Fact Sheet for Patients:  StrictlyIdeas.no Fact Sheet for Healthcare Providers: BankingDealers.co.za This test is not yet approved or cleared by the [place] FDA and has been authorized for detection and/or diagnosis of SARS-CoV-2  by FDA under an Emergency Use Authorization (EUA).  This EUA will remain in effect (meaning this test can be used) for the duration of the COVID-19 declaration under Section 564(b)(1) of the Act, 21 U.S.C. section 360bbb-3(b)(1), unless the authorization is terminated or revoked sooner. Performed at [facility], [address], [place],  [address]   Surgical PCR screen     Status: Abnormal   Collection Time: 07/06/19 12:57 AM   Specimen: Nasal Mucosa; Nasal Swab  Result Value Ref Range Status   MRSA, PCR POSITIVE (A) NEGATIVE Final   Staphylococcus aureus POSITIVE (A) NEGATIVE Final    Comment: RESULT CALLED TO, READ BACK  BY AND VERIFIED WITH: J RHUE,RN @0419  07/06/19 MKELLY (NOTE) The Xpert SA Assay (FDA approved for NASAL specimens in patients 33 years of age and older), is one component of a comprehensive surveillance program. It is not intended to diagnose infection nor to guide or monitor treatment. Performed at [facility], [address], [place], [place] [address]     Radiology Reports Dg Chest 2 View  Result Date: 07/03/2019 CLINICAL DATA:  History of end-stage renal disease and CHF. EXAM: CHEST - 2 VIEW COMPARISON:  Chest radiograph 06/22/2019, 04/28/2019 FINDINGS: Stable cardiomediastinal contours with enlarged heart size. A few minimal linear opacities at the right lung base likely reflect atelectasis or scar. No new focal infiltrate or evidence of edema. No pneumothorax or pleural effusion. No acute findings in the visualized skeleton. IMPRESSION: No evidence of active disease. Electronically Signed   By: Audie Pinto M.D.   On: 07/03/2019 13:57   US Renal  Result Date: 06/23/2019 CLINICAL DATA:  Acute renal failure EXAM: RENAL / URINARY TRACT ULTRASOUND COMPLETE COMPARISON:  None. FINDINGS: Right Kidney: Renal measurements: 11.7 x 4.8 x 6.1 cm = volume: 177 mL . Echogenicity within normal limits. No mass or hydronephrosis visualized. Left Kidney: Renal  measurements: 11.1 x 5.3 x 5.9 cm = volume: 181 mL. Echogenicity within normal limits. No mass or hydronephrosis visualized. Bladder: Appears normal for degree of bladder distention. IMPRESSION: Normal bilateral renal ultrasound. Electronically Signed   By: Kathreen Devoid   On: 06/23/2019 11:46   US Venous Img Lower Bilateral  Result Date: 06/22/2019 CLINICAL DATA:  Bilateral lower extremity pain and edema. Evaluate for DVT. EXAM: BILATERAL LOWER EXTREMITY VENOUS DOPPLER ULTRASOUND TECHNIQUE: Gray-scale sonography with graded compression, as well as color Doppler and duplex ultrasound were performed to evaluate the lower extremity deep venous systems from the level of the common femoral vein and including the common femoral, femoral, profunda femoral, popliteal and calf veins including the posterior tibial, peroneal and gastrocnemius veins when visible. The superficial great saphenous vein was also interrogated. Spectral Doppler was utilized to evaluate flow at rest and with distal augmentation maneuvers in the common femoral, femoral and popliteal veins. COMPARISON:  None. FINDINGS: RIGHT LOWER EXTREMITY Common Femoral Vein: No evidence of thrombus. Normal compressibility, respiratory phasicity and response to augmentation. Saphenofemoral Junction: No evidence of thrombus. Normal compressibility and flow on color Doppler imaging. Profunda Femoral Vein: No evidence of thrombus. Normal compressibility and flow on color Doppler imaging. Femoral Vein: No evidence of thrombus. Normal compressibility, respiratory phasicity and response to augmentation. Popliteal Vein: No evidence of thrombus. Normal compressibility, respiratory phasicity and response to augmentation. Calf Veins: No evidence of thrombus. Normal compressibility and flow on color Doppler imaging. Superficial Great Saphenous Vein: No evidence of thrombus. Normal compressibility. Venous Reflux:  None. Other Findings:  None. LEFT LOWER EXTREMITY Common  Femoral Vein: No evidence of thrombus. Normal compressibility, respiratory phasicity and response to augmentation. Saphenofemoral Junction: No evidence of thrombus. Normal compressibility and flow on color Doppler imaging. Profunda Femoral Vein: No evidence of thrombus. Normal compressibility and flow on color Doppler imaging. Femoral Vein: No evidence of thrombus. Normal compressibility, respiratory phasicity and response to augmentation. Popliteal Vein: No evidence of thrombus. Normal compressibility, respiratory phasicity and response to augmentation. Calf Veins: No evidence of thrombus. Normal compressibility and flow on color Doppler imaging. Superficial Great Saphenous Vein: No evidence of thrombus. Normal compressibility. Venous Reflux:  None. Other Findings:  None. IMPRESSION: No evidence of DVT within either lower extremity. Electronically Signed  By: Sandi Mariscal M.D.   On: 06/22/2019 14:52   Dg Chest Port 1 View  Result Date: 06/22/2019 CLINICAL DATA:  Pt c/o swelling in lower extremities x several days. Hx CHF, obesity, diabetes, nonsmoker EXAM: PORTABLE CHEST 1 VIEW COMPARISON:  Chest radiographs dated 04/28/2019, 02/23/2019 FINDINGS: Stable cardiomediastinal contours with enlarged heart size. Central vascular congestion without overt edema. No pneumothorax or pleural effusion. Visualized skeleton is unremarkable. IMPRESSION: Cardiomegaly and central vascular congestion without overt edema. Electronically Signed   By: Audie Pinto M.D.   On: 06/22/2019 10:53   Vas US Renal Artery Duplex  Result Date: 06/16/2019 ABDOMINAL VISCERAL High Risk Factors: Hypertension, hyperlipidemia, Diabetes, no history of                    smoking. Other Factors: Limited study secondary to obesity and bowel gas. Limitations: Air/bowel gas and obesity. Performing Technologist: Delorise Shiner RVT  Examination Guidelines: A complete evaluation includes B-mode imaging, spectral Doppler, color Doppler, and power  Doppler as needed of all accessible portions of each vessel. Bilateral testing is considered an integral part of a complete examination. Limited examinations for reoccurring indications may be performed as noted.  Duplex Findings: +----------+--------+--------+------+--------+  Mesenteric PSV cm/s EDV cm/s Plaque Comments  +----------+--------+--------+------+--------+  Aorta Mid     58                              +----------+--------+--------+------+--------+  +------------------+--------+--------+-------+  Right Renal Artery PSV cm/s EDV cm/s Comment  +------------------+--------+--------+-------+  Proximal              25                      +------------------+--------+--------+-------+  Mid                   26                      +------------------+--------+--------+-------+  Distal                27                      +------------------+--------+--------+-------+ +-----------------+--------+--------+-------+  Left Renal Artery PSV cm/s EDV cm/s Comment  +-----------------+--------+--------+-------+  Origin               40                      +-----------------+--------+--------+-------+  Proximal             29                      +-----------------+--------+--------+-------+  Mid                  27                      +-----------------+--------+--------+-------+  Distal               23                      +-----------------+--------+--------+-------+ +------------+--------+--------+--+-----------+--------+--------+---+  Right Kidney PSV cm/s EDV cm/s RI Left Kidney PSV cm/s EDV cm/s RI   +------------+--------+--------+--+-----------+--------+--------+---+  Upper Pole  Upper Pole                         +------------+--------+--------+--+-----------+--------+--------+---+  Mid                               Mid                                +------------+--------+--------+--+-----------+--------+--------+---+  Lower Pole                        Lower Pole                          +------------+--------+--------+--+-----------+--------+--------+---+  Hilar                             Hilar                              +------------+--------+--------+--+-----------+--------+--------+---+ +------------------+-------+------------------+-------+  Right Kidney               Left Kidney                 +------------------+-------+------------------+-------+  RAR                        RAR                         +------------------+-------+------------------+-------+  RAR (manual)               RAR (manual)                +------------------+-------+------------------+-------+  Cortex                     Cortex                      +------------------+-------+------------------+-------+  Cortex thickness   1.88 mm Corex thickness    1.84 mm  +------------------+-------+------------------+-------+  Kidney length (cm) 13.27   Kidney length (cm) 10.24    +------------------+-------+------------------+-------+  Summary: Renal:  Right: Normal size right kidney. No evidence of right renal artery        stenosis. RRV flow present. Limited study secondary to body        habitus. Could not insonnate right renal artery origin. Left:  Normal size of left kidney. No evidence of left renal artery        stenosis. LRV flow present. Limited study secondary to body        habitus.  *See table(s) above for measurements and observations.  Diagnosing physician: Ruta Hinds MD  Electronically signed by Ruta Hinds MD on 06/16/2019 at 10:43:24 AM.    Final      CBC Recent Labs  Lab 07/03/19 1318 07/04/19 0449 07/06/19 0502 07/07/19 0500  WBC 10.9* 8.8 9.0 8.8  HGB 11.6* 11.1* 9.5* 9.8*  HCT 36.7* 35.4* 30.9* 30.8*  PLT 334 314 352 398  MCV 77.4* 78.1* 78.0* 77.8*  MCH 24.5* 24.5* 24.0* 24.7*  MCHC 31.6 31.4 30.7 31.8  RDW 15.3 15.1 15.1 15.5  LYMPHSABS  --  1.9  --   --   MONOABS  --  1.1*  --   --   EOSABS  --  0.2  --   --   BASOSABS  --  0.1  --   --     Chemistries  Recent Labs    Lab 07/04/19 0449 07/05/19 0459 07/05/19 1321 07/06/19 0502 07/07/19 0500  NA 135 134* 132* 132* 132*  K 3.4* 3.2* 3.7 3.3* 3.5  CL 98 97* 95* 96* 96*  CO2 25 22 22  19* 21*  GLUCOSE 203* 114* 252* 147* 197*  BUN 66* 75* 78* 83* 88*  CREATININE 5.27* 5.84* 6.10* 6.32* 6.56*  CALCIUM 8.0* 7.9* 8.0* 7.7* 8.1*  MG  --  2.1  --   --   --    ------------------------------------------------------------------------------------------------------------------ No results for input(s): CHOL, HDL, LDLCALC, TRIG, CHOLHDL, LDLDIRECT in the last 72 hours.  Lab Results  Component Value Date   HGBA1C 12.1 (H) 06/23/2019   ------------------------------------------------------------------------------------------------------------------ No results for input(s): TSH, T4TOTAL, T3FREE, THYROIDAB in the last 72 hours.  Invalid input(s): FREET3 ------------------------------------------------------------------------------------------------------------------ Recent Labs    07/06/19 0840  FERRITIN 216  TIBC 278  IRON 11*    Coagulation profile Recent Labs  Lab 07/03/19 1318  INR 1.1    No results for input(s): DDIMER in the last 72 hours.  Cardiac Enzymes No results for input(s): CKMB, TROPONINI, MYOGLOBIN in the last 168 hours.  Invalid input(s): CK ------------------------------------------------------------------------------------------------------------------    Component Value Date/Time   BNP 119.0 (H) 06/22/2019 1053    Roxan Hockey M.D on 07/07/2019 at 4:35 PM  Go to [email or web address] - for contact info  [facility] - Office  [phone]

## 2019-07-07 NOTE — Progress Notes (Signed)
[facility] Progress Note  Name: Kelly Key MRN: [id number] DOB: Apr 07, 1971  Chief Complaint:  Swelling and shortness of breath  Subjective:  He was taken down for the tunneled catheter for dialysis yesterday and then refused because he was not able to be "completely out".  Primary team discussed with patient and his sister and fiance yesterday afternoon and he agreed to be transferred to [facility] for the tunneled catheter as not able to rescheduled here the rest of this week.  There has not yet been a bed available.  He had 1.4 liter UOP charted as well as additional unquantified void.  He has felt overwhelmed with everything going on with his health.  He confirms that he does want to do dialysis and that he is willing to undergo tunneled catheter placement even if he is not able to be completely "knocked out".  States that "I don't have much choice if I want to live".  Was short of breath yesterday which is better day.     Review of systems:   Patient reports shortness of breath which he states is improved from yesterday Denies nausea or vomiting  No difficulty with urination  Still feels swollen  Has been NPO for possible access placement  ----------------------------- Background on consult:  Kelly Key is an 48 y.o. male with  a PMH significant for dCHF, DM, HTN, HLD, morbid obesity, fatty liver disease, and CKD stage 4 followed by Dr. Lowanda Leilene Diprima with nephrotic range proteinuria (documented in past as >11 grams/24 hours) who was recently hospitalized on 06/22/2019 at [facility] with a 2 week history of worsening lower extremity edema and abdominal girth not responsive to increasing doses of torsemide as an outpatient. He has had accelerated HTN and workup for renal artery stenosis was underway with [name] however that has not been scheduled. During that hospitalization there were extensive discussions with the pt educating him about the need to initiate dialysis but he refused  wanting to give a trial with outpt diuretics.  In the past Mr. [name] had expressed that he would "rather die than go on dialysis" and is followed by Dr. Lowanda Sapna Padron, however on further discussion he always states that he's not ready to die. Patient went home with torsemide 60mg  BID and unfortunately did not respond even though he was compliant. His leg swelling became worse with difficult ambulation and dyspnea as well. He denies NSAID use, nausea, CP or fevers.   Intake/Output Summary (Last 24 hours) at 07/07/2019 0828 Last data filed at 07/07/2019 [id number] Gross per 24 hour  Intake -  Output 1400 ml  Net -1400 ml    Vitals:  Vitals:   07/06/19 1029 07/06/19 1244 07/06/19 2120 07/07/19 0503  BP: (!) 177/96 (!) 141/101 (!) 172/94 138/84  Pulse: 84 93 98 80  Resp: 20 19 17 16   Temp: 98.2 F (36.8 C)  99 F (37.2 C) 98.5 F (36.9 C)  TempSrc: Oral  Oral Oral  SpO2: 95% 97% 96% 94%  Weight:    (!) 141.9 kg  Height:         Physical Exam:  General adult male in bed in no acute distress  HEENT normocephalic atraumatic extraocular movements intact sclera anicteric Neck supple trachea midline Lungs clear to auscultation bilaterally normal work of breathing at rest; on room air  Heart regular rate and rhythm; no rub appreciated Abdomen soft nontender obese habitus  Extremities 2+ edema bilateral lower extremities Neuro - alert and oriented x 3; provides  history GU no foley   Medications reviewed   Labs:  BMP Latest Ref Rng & Units 07/07/2019 07/06/2019 07/05/2019  Glucose 70 - 99 mg/dL 197(H) 147(H) 252(H)  BUN 6 - 20 mg/dL 88(H) 83(H) 78(H)  Creatinine 0.61 - 1.24 mg/dL 6.56(H) 6.32(H) 6.10(H)  Sodium 135 - 145 mmol/L 132(L) 132(L) 132(L)  Potassium 3.5 - 5.1 mmol/L 3.5 3.3(L) 3.7  Chloride 98 - 111 mmol/L 96(L) 96(L) 95(L)  CO2 22 - 32 mmol/L 21(L) 19(L) 22  Calcium 8.9 - 10.3 mg/dL 8.1(L) 7.7(L) 8.0(L)     Assessment/Plan:   # AKI on CKD in setting of decompensated diastolic CHF  and escalating diuretic dosage.Felt to have progressed to ESRD  - BL creatinine appears to be in the 3.6-4 range with e/o continued progression since mid 2019.No evidence of uremia.  Previously has had a difficult time making decision re: goals of care with regard to dialysis.  Recent renal u/s neg for hydro (06/23/19).  Post void negligible (bladder scan of zero charted) - Have recommended initiation of HD  - He is awaiting transfer to [facility] for tunneled dialysis access and dialysis initiation - Strict I&O's   - SW at [facility] has initiated search for outpatient HD unit - [facility] is closest to the patient; he has previously followed with Dr. Hinda Lenis    # Anasarca/acute on chronic diastolic CHF - Started on IV lasix. His urine prot/creat ratio is markedly elevated at 6.3 mg/g, however albumin is only mildly decreased at 3.1 (consistent with DN and obesity related FSGS). - We have recommended transitioning to HD for volume management.  He failed outpatient oral diuretics and worsening azotemia on IV diuretics - Continue lasix IV BID and add metolazone 5 mg PO once before AM lasix dose.   # Malignant HTN- poorly controlled with unrevealing secondary HTN workup but pending MRA of renal arteries. He was seen by [name] who ordered test, will need to see if Radiology can do a non-contrasted MRA of renal arteries with good image quality.  - Felt that therapy of possible RAS would not preclude the need for HD.  Patient likely has very little renal reserve and PTA with contrast if he has significant RAS will almost certainly push him into AKI + HD. - optimize volume status as above - BP control improved with 138/84 on most recent check  # CKD stage 4- has been progressive due to poorly controlled HTN and DM. Felt that kidney biopsy would not be helpful given chronicity and severity of CKD at this point. Providers have discussed the severity of his CKD as well as stage; have also discussed  HD, home HD, PD, and renal transplantation (Dr. Augustin Coupe). Will have him watch educational video tapes regarding dialysis. We also discussed the ways to delay the progression of CKD with blood pressure control, DM management, and optimizing weight.  Given ongoing aggressive diuresis, AKI/CKD, and his unwillingness to consider HD at this time, risks of ACE/ARB outweigh benefits - Felt to have progressed to ESRD as above  # DM- poorly controlled with Hgb A1c of 12.4%. Management per primary team   # Hypokalemia- improved with repletion; potassium 20 meq PO once now  # Anemia of CKD- iron deficiency.  Feraheme on 9/10.  Anticipate need for ESA as well though previously above goal   # Obesity- consider dietary consult to help with DM control and weight loss   # Hyperuricemia- consider allopurinol or uloric per primary team   Cecille Rubin  Wellington Hampshire, MD 07/07/2019 8:28 AM

## 2019-07-07 NOTE — Progress Notes (Signed)
Inpatient Diabetes Program Recommendations  AACE/ADA: New Consensus Statement on Inpatient Glycemic Control   Target Ranges:  Prepandial:   less than 140 mg/dL      Peak postprandial:   less than 180 mg/dL (1-2 hours)      Critically ill patients:  140 - 180 mg/dL   Results for BENANCIO, Greenleaf (MRN [id number]) as of 07/07/2019 08:57  Ref. Range 07/06/2019 08:17 07/06/2019 16:24 07/06/2019 20:20 07/07/2019 08:12  Glucose-Capillary Latest Ref Range: 70 - 99 mg/dL 123 (H) 258 (H) 208 (H) 174 (H)   Review of Glycemic Control  Diabetes history: DM2 Outpatient Diabetes medications: Levemir 35 units BID, Novolog 5-15 units TID with meals Current orders for Inpatient glycemic control: Levemir 35 units BID, Novolog 0-20 units TID with meals, Novolog 0-5 units QHS  Inpatient Diabetes Program Recommendations:   Insulin-Meal Coverage: Post prandial glucose consistently elevated. Please consider ordering Novolog 3 units TID with meals for meal coverage if patient eats at least 50% of meals.  Thanks, Barnie Alderman, RN, MSN, CDE Diabetes Coordinator Inpatient Diabetes Program [phone] (Team Pager from 8am to 5pm)

## 2019-07-07 NOTE — Plan of Care (Signed)
  Problem: Education: Goal: Knowledge of General Education information will improve Description Including pain rating scale, medication(s)/side effects and non-pharmacologic comfort measures Outcome: Progressing   Problem: Health Behavior/Discharge Planning: Goal: Ability to manage health-related needs will improve Outcome: Progressing   

## 2019-07-07 NOTE — Progress Notes (Signed)
Called report to Anisha on [id number]. [facility] picked up patient and belongingsto transport to [facility] campus.

## 2019-07-08 ENCOUNTER — Inpatient Hospital Stay (HOSPITAL_COMMUNITY): Payer: Medicare Other

## 2019-07-08 ENCOUNTER — Encounter (HOSPITAL_COMMUNITY): Payer: Self-pay | Admitting: Physician Assistant

## 2019-07-08 HISTORY — PX: IR FLUORO GUIDE CV LINE RIGHT: IMG2283

## 2019-07-08 HISTORY — PX: IR US GUIDE VASC ACCESS RIGHT: IMG2390

## 2019-07-08 LAB — RENAL FUNCTION PANEL
Albumin: 2.2 g/dL — ABNORMAL LOW (ref 3.5–5.0)
Anion gap: 18 — ABNORMAL HIGH (ref 5–15)
BUN: 99 mg/dL — ABNORMAL HIGH (ref 6–20)
CO2: 20 mmol/L — ABNORMAL LOW (ref 22–32)
Calcium: 9 mg/dL (ref 8.9–10.3)
Chloride: 95 mmol/L — ABNORMAL LOW (ref 98–111)
Creatinine, Ser: 6.99 mg/dL — ABNORMAL HIGH (ref 0.61–1.24)
GFR calc Af Amer: 10 mL/min — ABNORMAL LOW (ref >60)
GFR calc non Af Amer: 8 mL/min — ABNORMAL LOW (ref >60)
Glucose, Bld: 198 mg/dL — ABNORMAL HIGH (ref 70–99)
Phosphorus: 9.8 mg/dL — ABNORMAL HIGH (ref 2.5–4.6)
Potassium: 3.6 mmol/L (ref 3.5–5.1)
Sodium: 133 mmol/L — ABNORMAL LOW (ref 135–145)

## 2019-07-08 LAB — HEPATITIS B SURFACE ANTIGEN: Hepatitis B Surface Ag: NEGATIVE

## 2019-07-08 LAB — CBC
HCT: 32.9 % — ABNORMAL LOW (ref 39.0–52.0)
Hemoglobin: 10.4 g/dL — ABNORMAL LOW (ref 13.0–17.0)
MCH: 24.1 pg — ABNORMAL LOW (ref 26.0–34.0)
MCHC: 31.6 g/dL (ref 30.0–36.0)
MCV: 76.2 fL — ABNORMAL LOW (ref 80.0–100.0)
Platelets: 443 10*3/uL — ABNORMAL HIGH (ref 150–400)
RBC: 4.32 MIL/uL (ref 4.22–5.81)
RDW: 15.3 % (ref 11.5–15.5)
WBC: 8.1 10*3/uL (ref 4.0–10.5)
nRBC: 0 % (ref 0.0–0.2)

## 2019-07-08 LAB — GLUCOSE, CAPILLARY
Glucose-Capillary: 188 mg/dL — ABNORMAL HIGH (ref 70–99)
Glucose-Capillary: 196 mg/dL — ABNORMAL HIGH (ref 70–99)
Glucose-Capillary: 237 mg/dL — ABNORMAL HIGH (ref 70–99)
Glucose-Capillary: 199 mg/dL — ABNORMAL HIGH (ref 70–99)

## 2019-07-08 MED ORDER — LIDOCAINE HCL 1 % IJ SOLN
INTRAMUSCULAR | Status: AC
  Filled 2019-07-08: qty 20

## 2019-07-08 MED ORDER — HEPARIN SODIUM (PORCINE) 1000 UNIT/ML DIALYSIS
1000.0000 [IU] | INTRAMUSCULAR | Status: DC | PRN
  Administered 2019-07-08: 21:00:00 3800 [IU] via INTRAVENOUS_CENTRAL
  Administered 2019-07-08: 13:00:00 3.8 mL via INTRAVENOUS_CENTRAL

## 2019-07-08 MED ORDER — SODIUM CHLORIDE 0.9 % IV SOLN: 100.0000 mL | INTRAVENOUS | Status: DC | PRN

## 2019-07-08 MED ORDER — HEPARIN SODIUM (PORCINE) 1000 UNIT/ML IJ SOLN
INTRAMUSCULAR | Status: AC
  Administered 2019-07-08: 3.2 mL
  Administered 2019-07-08: 3.8 mL via INTRAVENOUS_CENTRAL
  Filled 2019-07-08: qty 1

## 2019-07-08 MED ORDER — HEPARIN SODIUM (PORCINE) 1000 UNIT/ML IJ SOLN
INTRAMUSCULAR | Status: AC
  Administered 2019-07-08: 3800 [IU] via INTRAVENOUS_CENTRAL
  Filled 2019-07-08: qty 4

## 2019-07-08 MED ORDER — HEPARIN SODIUM (PORCINE) 1000 UNIT/ML DIALYSIS: 1000.0000 [IU] | INTRAMUSCULAR | Status: DC | PRN

## 2019-07-08 MED ORDER — GELATIN ABSORBABLE 12-7 MM EX MISC
CUTANEOUS | Status: AC
  Administered 2019-07-08: 13:00:00
  Filled 2019-07-08: qty 1

## 2019-07-08 MED ORDER — CEFAZOLIN SODIUM-DEXTROSE 2-4 GM/100ML-% IV SOLN
INTRAVENOUS | Status: AC
  Administered 2019-07-08: 2000 mg
  Filled 2019-07-08: qty 100

## 2019-07-08 MED ORDER — ALTEPLASE 2 MG IJ SOLR: 2.0000 mg | Freq: Once | INTRAMUSCULAR | Status: DC | PRN

## 2019-07-08 MED ORDER — FENTANYL CITRATE (PF) 100 MCG/2ML IJ SOLN
INTRAMUSCULAR | Status: AC | PRN
  Administered 2019-07-08 (×2): 50 ug via INTRAVENOUS

## 2019-07-08 MED ORDER — FENTANYL CITRATE (PF) 100 MCG/2ML IJ SOLN
INTRAMUSCULAR | Status: AC
  Filled 2019-07-08: qty 2

## 2019-07-08 MED ORDER — HEPARIN SODIUM (PORCINE) 5000 UNIT/ML IJ SOLN
5000.0000 [IU] | Freq: Three times a day (TID) | INTRAMUSCULAR | Status: DC
  Administered 2019-07-09 – 2019-07-16 (×20): 5000 [IU] via SUBCUTANEOUS
  Filled 2019-07-08 (×22): qty 1

## 2019-07-08 MED ORDER — SEVELAMER CARBONATE 800 MG PO TABS
800.0000 mg | ORAL_TABLET | Freq: Three times a day (TID) | ORAL | Status: DC
  Administered 2019-07-09 – 2019-07-16 (×20): 800 mg via ORAL
  Filled 2019-07-08 (×21): qty 1

## 2019-07-08 MED ORDER — MIDAZOLAM HCL 2 MG/2ML IJ SOLN
INTRAMUSCULAR | Status: AC | PRN
  Administered 2019-07-08 (×2): 0.5 mg via INTRAVENOUS
  Administered 2019-07-08: 1 mg via INTRAVENOUS

## 2019-07-08 MED ORDER — MIDAZOLAM HCL 2 MG/2ML IJ SOLN
INTRAMUSCULAR | Status: AC
  Filled 2019-07-08: qty 2

## 2019-07-08 MED ORDER — LIDOCAINE HCL 1 % IJ SOLN
INTRAMUSCULAR | Status: AC | PRN
  Administered 2019-07-08: 10 mL

## 2019-07-08 NOTE — Progress Notes (Signed)
[facility]    NEPHROLOGY PROGRESS NOTE  SUBJECTIVE: Patient seen and examined earlier this afternoon.  Denied any chest pain, shortness of breath, nausea, vomiting, diarrhea or dysuria.  All other review of systems are negative.  Status post tunneled dialysis catheter placement.   OBJECTIVE:  Vitals:   07/08/19 1442 07/08/19 1628  BP: (!) 160/90 (!) 161/80  Pulse: 75 78  Resp: 18 15  Temp: 97.8 F (36.6 C) 98 F (36.7 C)  SpO2: 95% 100%    Intake/Output Summary (Last 24 hours) at 07/08/2019 1759 Last data filed at 07/08/2019 1600 Gross per 24 hour  Intake 423 ml  Output 3630 ml  Net -3207 ml      General:  AAOx3 NAD HEENT: MMM [place] AT anicteric sclera Neck:  No JVD, no adenopathy CV:  Heart RRR  Lungs:  L/S CTA bilaterally Abd:  abd SNT/ND with normal BS GU:  Bladder non-palpable Extremities: +3 bilateral lower extremity edema. Skin:  No skin rash  MEDICATIONS:  . allopurinol  200 mg Oral Daily  . aspirin EC  81 mg Oral Daily  . atorvastatin  80 mg Oral Daily  . carvedilol  25 mg Oral BID WC  . Chlorhexidine Gluconate Cloth  6 each Topical Q0600  . cloNIDine  0.3 mg Oral TID  . ezetimibe  10 mg Oral QPM  . fluticasone  1 spray Each Nare Daily  . furosemide  80 mg Intravenous BID  . gabapentin  100 mg Oral TID  . [START ON 07/09/2019] heparin  5,000 Units Subcutaneous Q8H  . insulin aspart  0-20 Units Subcutaneous TID WC  . insulin aspart  0-5 Units Subcutaneous QHS  . insulin detemir  35 Units Subcutaneous BID  . linaclotide  145 mcg Oral QAC breakfast  . mupirocin ointment  1 application Nasal BID  . potassium chloride  20 mEq Oral Once  . sodium chloride flush  3 mL Intravenous Q12H       LABS:   CBC Latest Ref Rng & Units 07/08/2019 07/07/2019 07/06/2019  WBC 4.0 - 10.5 K/uL 8.1 8.8 9.0  Hemoglobin 13.0 - 17.0 g/dL 10.4(L) 9.8(L) 9.5(L)  Hematocrit 39.0 - 52.0 % 32.9(L) 30.8(L) 30.9(L)  Platelets 150 - 400 K/uL 443(H) 398 352    CMP  Latest Ref Rng & Units 07/08/2019 07/07/2019 07/06/2019  Glucose 70 - 99 mg/dL 198(H) 197(H) 147(H)  BUN 6 - 20 mg/dL 99(H) 88(H) 83(H)  Creatinine 0.61 - 1.24 mg/dL 6.99(H) 6.56(H) 6.32(H)  Sodium 135 - 145 mmol/L 133(L) 132(L) 132(L)  Potassium 3.5 - 5.1 mmol/L 3.6 3.5 3.3(L)  Chloride 98 - 111 mmol/L 95(L) 96(L) 96(L)  CO2 22 - 32 mmol/L 20(L) 21(L) 19(L)  Calcium 8.9 - 10.3 mg/dL 9.0 8.1(L) 7.7(L)  Total Protein 6.5 - 8.1 g/dL - - -  Total Bilirubin 0.3 - 1.2 mg/dL - - -  Alkaline Phos 38 - 126 U/L - - -  AST 15 - 41 U/L - - -  ALT 0 - 44 U/L - - -    Lab Results  Component Value Date   CALCIUM 9.0 07/08/2019   PHOS 9.8 (H) 07/08/2019       Component Value Date/Time   COLORURINE STRAW (A) 06/23/2019 0500   APPEARANCEUR CLEAR 06/23/2019 0500   LABSPEC 1.010 06/23/2019 0500   PHURINE 6.0 06/23/2019 0500   GLUCOSEU >=500 (A) 06/23/2019 0500   HGBUR SMALL (A) 06/23/2019 0500   BILIRUBINUR NEGATIVE 06/23/2019 0500   KETONESUR NEGATIVE 06/23/2019  0500   PROTEINUR >=300 (A) 06/23/2019 0500   UROBILINOGEN 0.2 06/11/2012 0518   NITRITE NEGATIVE 06/23/2019 0500   LEUKOCYTESUR NEGATIVE 06/23/2019 0500   No results found for: PHART, PCO2ART, PO2ART, HCO3, TCO2, ACIDBASEDEF, O2SAT     Component Value Date/Time   IRON 11 (L) 07/06/2019 0840   TIBC 278 07/06/2019 0840   FERRITIN 216 07/06/2019 0840   IRONPCTSAT 4 (L) 07/06/2019 0840       ASSESSMENT/PLAN:    Kelly Mola Asheis an 48 y.o.malewitha PMH significant for dCHF, DM, HTN, HLD, morbid obesity, fatty liver disease, and CKD stage 4 followed by Dr. Lowanda Foster with nephrotic range proteinuria (documented in past as >11 grams/24 hours) who was recently hospitalized on 06/22/2019 [facility] with a 2 week history of worsening lower extremity edema and abdominal girth not responsive to increasing doses of torsemide as an outpatient. He has had accelerated HTN and workup for renal artery stenosis was underway with [name] however that has  not been scheduled.During that hospitalization there were extensive discussions with the pt educating him about the need to initiate dialysis but he refused wanting to give a trial with outpt diuretics. In the past Mr. Granholm hadexpressed that he would "rather die than go on dialysis" and is followed by Dr. Lowanda Foster, however on further discussion he always states that he's not ready to die. Patient went home with torsemide 60mg  BID and unfortunately did not respond even though he was compliant.   1.  End-stage renal disease.  First dialysis today.  Status post [facility] 07/08/2019.  Patient agreeable to start dialysis.  We will plan second treatment tomorrow.  2.  Hypertension.  Should improve with ultrafiltration.  3.  Acute on chronic diastolic congestive heart failure.  Should improve with dialysis.  4.  Malignant hypertension.  MRA pending of the renal arteries.  Will monitor blood pressure with dialysis.  Doubt intervention on any significant renal artery stenosis would make a difference in his renal function.  5.  Diabetes mellitus.  Overall poor control with a hemoglobin A1c of 12.4.  6.  Volume overload.  Should improve with ultrafiltration on dialysis.  7.  Anemia.  Hemoglobin above 10.  Will monitor.  8.  BMD.  Add phosphate binders.   Little Sturgeon, DO, [place]

## 2019-07-08 NOTE — Progress Notes (Signed)
Patient Demographics:    Kelly Key, is a 48 y.o. male, DOB - [birth year], [id number]  Admit date - 07/03/2019   Admitting Physician Karmen Bongo, MD  Outpatient Primary MD for the patient is [name], MD  LOS - 3  HPI 48 y.o. male with medical history significant of obesity (BMI 81); HTN; HLD: DM; stage 4 CKD; and chronic combined CHF (EF 50-55% in 2013) presenting with increased LE edema, decreased ambulation - readmitted on 07/03/2019 with anasarca in the setting of dCHF exacerbation- -transfered to [place] for tunneled HD catheter placement and initiation of hemodialysis  Chief Complaint  Patient presents with  . Leg Swelling        Subjective:   No acute issues or events overnight, denies shortness of breath, chest pain, nausea, vomiting, diarrhea, constipation, headache, fevers, chills.  Somewhat anxious for dialysis catheter placement but otherwise no acute complaints.   Assessment  & Plan :    Principal Problem:   Anasarca Active Problems:   HTN (hypertension)   Morbid obesity (HCC)   CKD (chronic kidney disease) stage 5, GFR less than 15 ml/min (HCC)   Uncontrolled type 2 diabetes mellitus with hyperglycemia, with long-term current use of insulin (HCC)   Dyslipidemia   Chronic combined systolic and diastolic CHF (congestive heart failure) (HCC)   Volume overload   Congestive heart failure (HCC)   Anasarca in the setting of HFpEF--last known EF per echo from 06/23/2019 is 50 to 55%, -patient with history of chronic low EF CHF--- presenting with acute on chronic preserved EF CHF exacerbation, -PTA patient was on torsemide 60 mg twice daily -Continue IV   IV Lasix 80 mg twice daily by nephrologist -Status post right IJ tunneled hemodialysis catheter with IR on 07/08/2019 -Nephrology following for dialysis and assistance with management of his volume status  HTN, essential  -Workup for possible renal artery stenosis including MRI of renal arteries -  Dr Oneida Alar has scheduled 07/15/19 -Stable, continue Coreg 25 mg twice daily, clonidine 0.3 mg TID  CKD 4- progressively worsening -Prior renal ultrasound without obstructive uropathy -Likely to initiate hemodialysis as above per nephrology -Defer to nephrology as above for management of electrolytes -initiate on renal diet once diet resumed  Chronic anemia of chronic diease 2/2 CKD superimposed on iron deficiency anemia -Hemoglobin 10.4, stable -Defer decision on EPO agent to nephrology service -Serum iron is 11, iron saturation 4 - will discuss benefit of low dose IV iron (20-30mg ) with dialysis  Insulin dependent DM2, uncontrolled -last A1c 12.4, reflecting uncontrolled diabetes -Patient will be n.p.o. to allow for HD tunneled catheter placement on 07/08/2019, so Hold Levemir until PO resumed -thereafter resume Levemir 35 units twice daily -Continue sliding scale insulin, hypoglycemic protocol  Intermittent chest discomfort/dyspnea on exertion, likely in the setting of poor volume control/ascites -Questionably effusion vs musculoskeletal in nature -Not a candidate for CTA chest due to renal function, chronic lower extremity edema is most likely due to renal and heart failure -patient had negative venous Dopplers of the lower extremity on 02/21/2019, also had repeat negative lower extremity Dopplers on 04/28/2019, lower extremity venous Dopplers were again negative on 8/26/202- -continue DVT prophylaxis, low clinical index of suspicion for PE at this time  Code Status :  FULL Disposition Plan:Remain inpatient for tunneled HD catheter placement and initiation of HD - disposition pending clinical improvement and possible need for dialysis slot Consults  :  Nephrology/general surgery/IR  Procedures- - tunneled HD catheter placement 07/08/2019  DVT Prophylaxis  :   - Heparin - SCDs  Lab Results  Component Value Date    PLT 398 07/07/2019    Inpatient Medications  Scheduled Meds: . allopurinol  200 mg Oral Daily  . aspirin EC  81 mg Oral Daily  . atorvastatin  80 mg Oral Daily  . carvedilol  25 mg Oral BID WC  . Chlorhexidine Gluconate Cloth  6 each Topical Q0600  . cloNIDine  0.3 mg Oral TID  . ezetimibe  10 mg Oral QPM  . fluticasone  1 spray Each Nare Daily  . furosemide  80 mg Intravenous BID  . gabapentin  100 mg Oral TID  . heparin  5,000 Units Subcutaneous Q8H  . insulin aspart  0-20 Units Subcutaneous TID WC  . insulin aspart  0-5 Units Subcutaneous QHS  . insulin detemir  35 Units Subcutaneous BID  . linaclotide  145 mcg Oral QAC breakfast  . mupirocin ointment  1 application Nasal BID  . potassium chloride  20 mEq Oral Once  . sodium chloride flush  3 mL Intravenous Q12H   Continuous Infusions: . sodium chloride     PRN Meds:.sodium chloride, acetaminophen, albuterol, calcium carbonate (dosed in mg elemental calcium), camphor-menthol **AND** hydrOXYzine, docusate sodium, feeding supplement (NEPRO CARB STEADY), HYDROcodone-acetaminophen, ondansetron (ZOFRAN) IV, sodium chloride flush, sorbitol    Anti-infectives (From admission, onward)   Start     Dose/Rate Route Frequency Ordered Stop   07/06/19 1045  ceFAZolin (ANCEF) 3 g in dextrose 5 % 50 mL IVPB  Status:  Discontinued     3 g 100 mL/hr over 30 Minutes Intravenous On call to O.R. 07/06/19 1037 07/06/19 1231        Objective:   Vitals:   07/07/19 0958 07/07/19 1835 07/07/19 2042 07/08/19 0520  BP: (!) 156/91 132/77 140/80 138/81  Pulse: 88 74 74 70  Resp: 16 18 18 19   Temp: 97.9 F (36.6 C) 97.7 F (36.5 C) (!) 97.5 F (36.4 C) 97.6 F (36.4 C)  TempSrc: Oral Oral Oral Oral  SpO2: 96% 93% 98% 100%  Weight:   (!) 141.9 kg   Height:        Wt Readings from Last 3 Encounters:  07/07/19 (!) 141.9 kg  06/27/19 (!) 141.6 kg  06/16/19 (!) 138.8 kg     Intake/Output Summary (Last 24 hours) at 07/08/2019 0731  Last data filed at 07/08/2019 0300 Gross per 24 hour  Intake 473.23 ml  Output 2530 ml  Net -2056.77 ml     Physical Exam Gen:- Awake Alert, morbidly obese, dyspnea on exertion HEENT:- [place].AT, No sclera icterus Neck-Supple Neck,No JVD,.  Lungs-bibasilar rales without overt rhonchi or wheeze, no accessory muscle use CV- S1, S2 normal, regular ,  Abd-  +ve B.Sounds, Abd Soft, increased truncal adiposity extremity/Skin: 3 + pitting edema/anasarca, pedal pulses present  Psych-affect is appropriate, oriented x3 Neuro-generalized weakness, no new focal deficits, no tremors   Data Review:   Micro Results Recent Results (from the past 240 hour(s))  SARS Coronavirus 2 [facility] order, Performed in [facility] hospital lab) Nasopharyngeal Nasopharyngeal Swab     Status: None   Collection Time: 07/03/19  4:08 PM   Specimen: Nasopharyngeal Swab  Result Value Ref Range Status  SARS Coronavirus 2 NEGATIVE NEGATIVE Final    Comment: (NOTE) If result is NEGATIVE SARS-CoV-2 target nucleic acids are NOT DETECTED. The SARS-CoV-2 RNA is generally detectable in upper and lower  respiratory specimens during the acute phase of infection. The lowest  concentration of SARS-CoV-2 viral copies this assay can detect is 250  copies / mL. A negative result does not preclude SARS-CoV-2 infection  and should not be used as the sole basis for treatment or other  patient management decisions.  A negative result may occur with  improper specimen collection / handling, submission of specimen other  than nasopharyngeal swab, presence of viral mutation(s) within the  areas targeted by this assay, and inadequate number of viral copies  (<250 copies / mL). A negative result must be combined with clinical  observations, patient history, and epidemiological information. If result is POSITIVE SARS-CoV-2 target nucleic acids are DETECTED. The SARS-CoV-2 RNA is generally detectable in upper and lower  respiratory  specimens dur ing the acute phase of infection.  Positive  results are indicative of active infection with SARS-CoV-2.  Clinical  correlation with patient history and other diagnostic information is  necessary to determine patient infection status.  Positive results do  not rule out bacterial infection or co-infection with other viruses. If result is PRESUMPTIVE POSTIVE SARS-CoV-2 nucleic acids MAY BE PRESENT.   A presumptive positive result was obtained on the submitted specimen  and confirmed on repeat testing.  While 2019 novel coronavirus  (SARS-CoV-2) nucleic acids may be present in the submitted sample  additional confirmatory testing may be necessary for epidemiological  and / or clinical management purposes  to differentiate between  SARS-CoV-2 and other Sarbecovirus currently known to infect humans.  If clinically indicated additional testing with an alternate test  methodology [phone]) is advised. The SARS-CoV-2 RNA is generally  detectable in upper and lower respiratory sp ecimens during the acute  phase of infection. The expected result is Negative. Fact Sheet for Patients:  StrictlyIdeas.no Fact Sheet for Healthcare Providers: BankingDealers.co.za This test is not yet approved or cleared by the [place] FDA and has been authorized for detection and/or diagnosis of SARS-CoV-2 by FDA under an Emergency Use Authorization (EUA).  This EUA will remain in effect (meaning this test can be used) for the duration of the COVID-19 declaration under Section 564(b)(1) of the Act, 21 U.S.C. section 360bbb-3(b)(1), unless the authorization is terminated or revoked sooner. Performed at [facility], [address], [place], [place] [address]   Surgical PCR screen     Status: Abnormal   Collection Time: 07/06/19 12:57 AM   Specimen: Nasal Mucosa; Nasal Swab  Result Value Ref Range Status   MRSA, PCR POSITIVE (A) NEGATIVE Final    Staphylococcus aureus POSITIVE (A) NEGATIVE Final    Comment: RESULT CALLED TO, READ BACK BY AND VERIFIED WITH: J RHUE,RN @0419  07/06/19 MKELLY (NOTE) The Xpert SA Assay (FDA approved for NASAL specimens in patients 51 years of age and older), is one component of a comprehensive surveillance program. It is not intended to diagnose infection nor to guide or monitor treatment. Performed at [facility], [address] [place], [place] [address]     Radiology Reports Dg Chest 2 View  Result Date: 07/03/2019 CLINICAL DATA:  History of end-stage renal disease and CHF. EXAM: CHEST - 2 VIEW COMPARISON:  Chest radiograph 06/22/2019, 04/28/2019 FINDINGS: Stable cardiomediastinal contours with enlarged heart size. A few minimal linear opacities at the right lung base likely reflect atelectasis  or scar. No new focal infiltrate or evidence of edema. No pneumothorax or pleural effusion. No acute findings in the visualized skeleton. IMPRESSION: No evidence of active disease. Electronically Signed   By: Audie Pinto M.D.   On: 07/03/2019 13:57   US Renal  Result Date: 06/23/2019 CLINICAL DATA:  Acute renal failure EXAM: RENAL / URINARY TRACT ULTRASOUND COMPLETE COMPARISON:  None. FINDINGS: Right Kidney: Renal measurements: 11.7 x 4.8 x 6.1 cm = volume: 177 mL . Echogenicity within normal limits. No mass or hydronephrosis visualized. Left Kidney: Renal measurements: 11.1 x 5.3 x 5.9 cm = volume: 181 mL. Echogenicity within normal limits. No mass or hydronephrosis visualized. Bladder: Appears normal for degree of bladder distention. IMPRESSION: Normal bilateral renal ultrasound. Electronically Signed   By: Kathreen Devoid   On: 06/23/2019 11:46   US Venous Img Lower Bilateral  Result Date: 06/22/2019 CLINICAL DATA:  Bilateral lower extremity pain and edema. Evaluate for DVT. EXAM: BILATERAL LOWER EXTREMITY VENOUS DOPPLER ULTRASOUND TECHNIQUE: Gray-scale sonography with graded compression, as well as color  Doppler and duplex ultrasound were performed to evaluate the lower extremity deep venous systems from the level of the common femoral vein and including the common femoral, femoral, profunda femoral, popliteal and calf veins including the posterior tibial, peroneal and gastrocnemius veins when visible. The superficial great saphenous vein was also interrogated. Spectral Doppler was utilized to evaluate flow at rest and with distal augmentation maneuvers in the common femoral, femoral and popliteal veins. COMPARISON:  None. FINDINGS: RIGHT LOWER EXTREMITY Common Femoral Vein: No evidence of thrombus. Normal compressibility, respiratory phasicity and response to augmentation. Saphenofemoral Junction: No evidence of thrombus. Normal compressibility and flow on color Doppler imaging. Profunda Femoral Vein: No evidence of thrombus. Normal compressibility and flow on color Doppler imaging. Femoral Vein: No evidence of thrombus. Normal compressibility, respiratory phasicity and response to augmentation. Popliteal Vein: No evidence of thrombus. Normal compressibility, respiratory phasicity and response to augmentation. Calf Veins: No evidence of thrombus. Normal compressibility and flow on color Doppler imaging. Superficial Great Saphenous Vein: No evidence of thrombus. Normal compressibility. Venous Reflux:  None. Other Findings:  None. LEFT LOWER EXTREMITY Common Femoral Vein: No evidence of thrombus. Normal compressibility, respiratory phasicity and response to augmentation. Saphenofemoral Junction: No evidence of thrombus. Normal compressibility and flow on color Doppler imaging. Profunda Femoral Vein: No evidence of thrombus. Normal compressibility and flow on color Doppler imaging. Femoral Vein: No evidence of thrombus. Normal compressibility, respiratory phasicity and response to augmentation. Popliteal Vein: No evidence of thrombus. Normal compressibility, respiratory phasicity and response to augmentation. Calf  Veins: No evidence of thrombus. Normal compressibility and flow on color Doppler imaging. Superficial Great Saphenous Vein: No evidence of thrombus. Normal compressibility. Venous Reflux:  None. Other Findings:  None. IMPRESSION: No evidence of DVT within either lower extremity. Electronically Signed   By: Sandi Mariscal M.D.   On: 06/22/2019 14:52   Dg Chest Port 1 View  Result Date: 06/22/2019 CLINICAL DATA:  Pt c/o swelling in lower extremities x several days. Hx CHF, obesity, diabetes, nonsmoker EXAM: PORTABLE CHEST 1 VIEW COMPARISON:  Chest radiographs dated 04/28/2019, 02/23/2019 FINDINGS: Stable cardiomediastinal contours with enlarged heart size. Central vascular congestion without overt edema. No pneumothorax or pleural effusion. Visualized skeleton is unremarkable. IMPRESSION: Cardiomegaly and central vascular congestion without overt edema. Electronically Signed   By: Audie Pinto M.D.   On: 06/22/2019 10:53   Dg Hand Complete Left  Result Date: 07/07/2019 CLINICAL DATA:  Pain and swelling, first  and second metacarpals. EXAM: LEFT HAND - COMPLETE 3+ VIEW COMPARISON:  None. FINDINGS: No fracture or dislocation of the left hand. Joint spaces are well preserved. Diffuse soft tissue edema about the hand. IMPRESSION: No fracture or dislocation of the left hand. Joint spaces are well preserved. Diffuse soft tissue edema about the hand. Electronically Signed   By: Eddie Candle M.D.   On: 07/07/2019 21:18   Vas US Renal Artery Duplex  Result Date: 06/16/2019 ABDOMINAL VISCERAL High Risk Factors: Hypertension, hyperlipidemia, Diabetes, no history of                    smoking. Other Factors: Limited study secondary to obesity and bowel gas. Limitations: Air/bowel gas and obesity. Performing Technologist: Delorise Shiner RVT  Examination Guidelines: A complete evaluation includes B-mode imaging, spectral Doppler, color Doppler, and power Doppler as needed of all accessible portions of each vessel.  Bilateral testing is considered an integral part of a complete examination. Limited examinations for reoccurring indications may be performed as noted.  Duplex Findings: +----------+--------+--------+------+--------+ MesentericPSV cm/sEDV cm/sPlaqueComments +----------+--------+--------+------+--------+ Aorta Mid    58                          +----------+--------+--------+------+--------+  +------------------+--------+--------+-------+ Right Renal ArteryPSV cm/sEDV cm/sComment +------------------+--------+--------+-------+ Proximal             25                   +------------------+--------+--------+-------+ Mid                  26                   +------------------+--------+--------+-------+ Distal               27                   +------------------+--------+--------+-------+ +-----------------+--------+--------+-------+ Left Renal ArteryPSV cm/sEDV cm/sComment +-----------------+--------+--------+-------+ Origin              40                   +-----------------+--------+--------+-------+ Proximal            29                   +-----------------+--------+--------+-------+ Mid                 27                   +-----------------+--------+--------+-------+ Distal              23                   +-----------------+--------+--------+-------+ +------------+--------+--------+--+-----------+--------+--------+---+ Right KidneyPSV cm/sEDV cm/sRILeft KidneyPSV cm/sEDV cm/sRI  +------------+--------+--------+--+-----------+--------+--------+---+ Upper Pole                    Upper Pole                     +------------+--------+--------+--+-----------+--------+--------+---+ Mid                           Mid                            +------------+--------+--------+--+-----------+--------+--------+---+ Lower Pole  Lower Pole                      +------------+--------+--------+--+-----------+--------+--------+---+ Hilar                         Hilar                          +------------+--------+--------+--+-----------+--------+--------+---+ +------------------+-------+------------------+-------+ Right Kidney             Left Kidney               +------------------+-------+------------------+-------+ RAR                      RAR                       +------------------+-------+------------------+-------+ RAR (manual)             RAR (manual)              +------------------+-------+------------------+-------+ Cortex                   Cortex                    +------------------+-------+------------------+-------+ Cortex thickness  1.88 mmCorex thickness   1.84 mm +------------------+-------+------------------+-------+ Kidney length (cm)13.27  Kidney length (cm)10.24   +------------------+-------+------------------+-------+  Summary: Renal:  Right: Normal size right kidney. No evidence of right renal artery        stenosis. RRV flow present. Limited study secondary to body        habitus. Could not insonnate right renal artery origin. Left:  Normal size of left kidney. No evidence of left renal artery        stenosis. LRV flow present. Limited study secondary to body        habitus.  *See table(s) above for measurements and observations.  Diagnosing physician: Ruta Hinds MD  Electronically signed by Ruta Hinds MD on 06/16/2019 at 10:43:24 AM.    Final      CBC Recent Labs  Lab 07/03/19 1318 07/04/19 0449 07/06/19 0502 07/07/19 0500  WBC 10.9* 8.8 9.0 8.8  HGB 11.6* 11.1* 9.5* 9.8*  HCT 36.7* 35.4* 30.9* 30.8*  PLT 334 314 352 398  MCV 77.4* 78.1* 78.0* 77.8*  MCH 24.5* 24.5* 24.0* 24.7*  MCHC 31.6 31.4 30.7 31.8  RDW 15.3 15.1 15.1 15.5  LYMPHSABS  --  1.9  --   --   MONOABS  --  1.1*  --   --   EOSABS  --  0.2  --   --   BASOSABS  --  0.1  --   --     Chemistries  Recent Labs  Lab  07/04/19 0449 07/05/19 0459 07/05/19 1321 07/06/19 0502 07/07/19 0500  NA 135 134* 132* 132* 132*  K 3.4* 3.2* 3.7 3.3* 3.5  CL 98 97* 95* 96* 96*  CO2 25 22 22  19* 21*  GLUCOSE 203* 114* 252* 147* 197*  BUN 66* 75* 78* 83* 88*  CREATININE 5.27* 5.84* 6.10* 6.32* 6.56*  CALCIUM 8.0* 7.9* 8.0* 7.7* 8.1*  MG  --  2.1  --   --   --    --------------------------------------------------------------------------------------------------  Lab Results  Component Value Date   HGBA1C 12.1 (H) 06/23/2019   -------------------------------------------------------------------------------------------------------------------------------------------------------------------------------------------------------------------- Recent Labs    07/06/19 0840  FERRITIN 216  TIBC 278  IRON 11*  Coagulation profile Recent Labs  Lab 07/03/19 1318  INR 1.1   ------------------------------------------------------------------------------------------------------------------    Component Value Date/Time   BNP 119.0 (H) 06/22/2019 1053    Little Ishikawa M.D on 07/08/2019 at 7:31 AM  Go to [email or web address] - for contact info  [facility] - Office  [phone]

## 2019-07-08 NOTE — Progress Notes (Signed)
TDC procedure note faxed to [facility] Admissions.  [name], [place] Renal Navigator [phone]

## 2019-07-08 NOTE — Plan of Care (Signed)
  Problem: Education: Goal: Knowledge of General Education information will improve Description Including pain rating scale, medication(s)/side effects and non-pharmacologic comfort measures Outcome: Progressing   

## 2019-07-08 NOTE — Progress Notes (Signed)
Renal Navigator received notification from Nephrologist/Dr. Grayland Ormond that patient is here for [facility] and needs OP HD referral. Per progress notes, patient's OP HD referral has been started by [facility] CSW/R. [place] and is in process with [facility] for [facility] OP HD unit. Renal Navigator notes that they are requesting TDC note and HepB antigen. Renal Navigator faxed HepB antigen and will fax [facility] note once procedure has been completed. Renal Navigator will follow up regarding acceptance and seat assignment.  [name], [place] Renal Navigator [phone]

## 2019-07-08 NOTE — Procedures (Signed)
Interventional Radiology Procedure Note  Procedure: Placement of a right IJ approach tunneled HD catheter.  23 cm tip to cuff. .  Tip is positioned at the superior cavoatrial junction and catheter is ready for immediate use.  Complications: None Recommendations:  - Ok to use - Do not submerge - Routine line care   Signed,  Courtany Mcmurphy S. Brenton Joines, DO    

## 2019-07-08 NOTE — H&P (Signed)
Chief Complaint: Progressive Kidney Disease now needing hemodialysis  Referring Physician(s): Finnigan, Jerrye Beavers  Supervising Physician: Corrie Mckusick  Patient Status: [facility] - In-pt  History of Present Illness: Kelly Key is a 48 y.o. male who presented to the ED c/o swelling.  His creatinine was 5.57 on admission.  He now needs hemodialysis and we are asked to place a tunneled HD catheter.  Past Medical History:  Diagnosis Date  . Acute diastolic CHF (congestive heart failure) ([place]) 06/11/2012   EF 50-55% [facility])  . Diabetes mellitus    A1c 11.5 (06/11/2012).  . Gout   . Hepatic steatosis 06/11/2012   Elevated LFTs  . Hyperlipemia   . Malignant hypertension   . Microcytic anemia 06/12/2012  . Obesity     Past Surgical History:  Procedure Laterality Date  . NO PAST SURGERIES      Allergies: Hydralazine and Lisinopril  Medications: Prior to Admission medications   Medication Sig Start Date End Date Taking? Authorizing Provider  albuterol (VENTOLIN HFA) 108 (90 Base) MCG/ACT inhaler Inhale 2 puffs into the lungs every 4 (four) hours as needed. 03/24/19  Yes [provider]  amLODipine (NORVASC) 10 MG tablet Take 10 mg by mouth daily.   Yes [provider]  atorvastatin (LIPITOR) 80 MG tablet Take 1 tablet by mouth daily. 06/06/19  Yes [provider]  carvedilol (COREG) 25 MG tablet Take 1 tablet (25 mg total) by mouth 2 (two) times daily with a meal. 06/27/19  Yes Tat, David, MD  cloNIDine (CATAPRES) 0.3 MG tablet Take 1 tablet (0.3 mg total) by mouth 3 (three) times daily. 04/04/18 07/03/19 Yes Shah, Pratik D, DO  ergocalciferol (VITAMIN D2) 1.25 MG (50000 UT) capsule Take 50,000 Units by mouth once a week.   Yes [provider]  ezetimibe (ZETIA) 10 MG tablet Take 10 mg by mouth every evening. 01/20/19  Yes [provider]  fluticasone (FLONASE) 50 MCG/ACT nasal spray Place 1 spray into both nostrils daily. 04/04/18  07/03/19 Yes Shah, Pratik D, DO  gabapentin (NEURONTIN) 100 MG capsule Take 1 capsule by mouth 3 (three) times daily. 05/12/19  Yes [provider]  insulin aspart (NOVOLOG) 100 UNIT/ML injection Inject 5-15 Units into the skin 3 (three) times daily before meals.    Yes [provider]  insulin detemir (LEVEMIR) 100 UNIT/ML injection Inject 0.35 mLs (35 Units total) into the skin 2 (two) times daily. 06/27/19  Yes Tat, Shanon Brow, MD  Insulin Pen Needle 31G X 5 MM MISC Use as directed. 04/04/18  Yes Manuella Ghazi, Pratik D, DO  linaclotide Rolan Lipa) 145 MCG CAPS capsule Take 1 capsule (145 mcg total) by mouth daily before breakfast. 04/05/18  Yes Manuella Ghazi, Pratik D, DO  potassium chloride SA (K-DUR) 20 MEQ tablet Take 1 tablet (20 mEq total) by mouth daily. 02/28/19  Yes Johnson, Clanford L, MD  sodium bicarbonate 650 MG tablet Take 650 mg by mouth 2 (two) times daily.   Yes [provider]  torsemide (DEMADEX) 20 MG tablet Take 3 tablets (60 mg total) by mouth 2 (two) times daily. 06/27/19  Yes TatShanon Brow, MD     Family History  Problem Relation Age of Onset  . Gout Mother   . Asthma Mother   . Diabetes Father   . Heart failure Father   . Diabetes Sister   . Hypertension Brother   . Pancreatic cancer Brother   . Diabetes Sister     Social History  Socioeconomic History  . Marital status: Single    Spouse name: Not on file  . Number of children: Not on file  . Years of education: Not on file  . Highest education level: Not on file  Occupational History  . Occupation: retired  Scientific laboratory technician  . Financial resource strain: Not hard at all  . Food insecurity    Worry: Patient refused    Inability: Patient refused  . Transportation needs    Medical: Patient refused    Non-medical: Patient refused  Tobacco Use  . Smoking status: Never Smoker  . Smokeless tobacco: Never Used  Substance and Sexual Activity  . Alcohol use: No    Comment: haven't drank in over 5 years   . Drug use:  No  . Sexual activity: Not Currently  Lifestyle  . Physical activity    Days per week: Patient refused    Minutes per session: Patient refused  . Stress: Not at all  Relationships  . Social Herbalist on phone: Patient refused    Gets together: Patient refused    Attends religious service: Patient refused    Active member of club or organization: Patient refused    Attends meetings of clubs or organizations: Patient refused    Relationship status: Patient refused  Other Topics Concern  . Not on file  Social History Narrative  . Not on file     Review of Systems: A 12 point ROS discussed and pertinent positives are indicated in the HPI above.  All other systems are negative.  Review of Systems  Vital Signs: BP (!) 153/93 (BP Location: Right Arm)   Pulse 79   Temp 97.6 F (36.4 C) (Axillary)   Resp 20   Ht 5\' 7"  (1.702 m)   Wt (!) 141.9 kg   SpO2 96%   BMI 49.00 kg/m   Physical Exam Vitals signs reviewed.  Constitutional:      Appearance: He is obese.  HENT:     Head: Normocephalic and atraumatic.  Eyes:     Extraocular Movements: Extraocular movements intact.  Neck:     Musculoskeletal: Normal range of motion.  Cardiovascular:     Rate and Rhythm: Normal rate and regular rhythm.  Pulmonary:     Effort: Pulmonary effort is normal.     Breath sounds: Normal breath sounds.  Abdominal:     Palpations: Abdomen is soft.  Musculoskeletal: Normal range of motion.  Skin:    General: Skin is warm and dry.  Neurological:     General: No focal deficit present.     Mental Status: He is alert and oriented to person, place, and time.  Psychiatric:        Mood and Affect: Mood normal.        Behavior: Behavior normal.        Thought Content: Thought content normal.        Judgment: Judgment normal.     Imaging: Dg Chest 2 View  Result Date: 07/03/2019 CLINICAL DATA:  History of end-stage renal disease and CHF. EXAM: CHEST - 2 VIEW COMPARISON:  Chest  radiograph 06/22/2019, 04/28/2019 FINDINGS: Stable cardiomediastinal contours with enlarged heart size. A few minimal linear opacities at the right lung base likely reflect atelectasis or scar. No new focal infiltrate or evidence of edema. No pneumothorax or pleural effusion. No acute findings in the visualized skeleton. IMPRESSION: No evidence of active disease. Electronically Signed   By: Jac Canavan.D.  On: 07/03/2019 13:57   US Renal  Result Date: 06/23/2019 CLINICAL DATA:  Acute renal failure EXAM: RENAL / URINARY TRACT ULTRASOUND COMPLETE COMPARISON:  None. FINDINGS: Right Kidney: Renal measurements: 11.7 x 4.8 x 6.1 cm = volume: 177 mL . Echogenicity within normal limits. No mass or hydronephrosis visualized. Left Kidney: Renal measurements: 11.1 x 5.3 x 5.9 cm = volume: 181 mL. Echogenicity within normal limits. No mass or hydronephrosis visualized. Bladder: Appears normal for degree of bladder distention. IMPRESSION: Normal bilateral renal ultrasound. Electronically Signed   By: Kathreen Devoid   On: 06/23/2019 11:46   US Venous Img Lower Bilateral  Result Date: 06/22/2019 CLINICAL DATA:  Bilateral lower extremity pain and edema. Evaluate for DVT. EXAM: BILATERAL LOWER EXTREMITY VENOUS DOPPLER ULTRASOUND TECHNIQUE: Gray-scale sonography with graded compression, as well as color Doppler and duplex ultrasound were performed to evaluate the lower extremity deep venous systems from the level of the common femoral vein and including the common femoral, femoral, profunda femoral, popliteal and calf veins including the posterior tibial, peroneal and gastrocnemius veins when visible. The superficial great saphenous vein was also interrogated. Spectral Doppler was utilized to evaluate flow at rest and with distal augmentation maneuvers in the common femoral, femoral and popliteal veins. COMPARISON:  None. FINDINGS: RIGHT LOWER EXTREMITY Common Femoral Vein: No evidence of thrombus. Normal  compressibility, respiratory phasicity and response to augmentation. Saphenofemoral Junction: No evidence of thrombus. Normal compressibility and flow on color Doppler imaging. Profunda Femoral Vein: No evidence of thrombus. Normal compressibility and flow on color Doppler imaging. Femoral Vein: No evidence of thrombus. Normal compressibility, respiratory phasicity and response to augmentation. Popliteal Vein: No evidence of thrombus. Normal compressibility, respiratory phasicity and response to augmentation. Calf Veins: No evidence of thrombus. Normal compressibility and flow on color Doppler imaging. Superficial Great Saphenous Vein: No evidence of thrombus. Normal compressibility. Venous Reflux:  None. Other Findings:  None. LEFT LOWER EXTREMITY Common Femoral Vein: No evidence of thrombus. Normal compressibility, respiratory phasicity and response to augmentation. Saphenofemoral Junction: No evidence of thrombus. Normal compressibility and flow on color Doppler imaging. Profunda Femoral Vein: No evidence of thrombus. Normal compressibility and flow on color Doppler imaging. Femoral Vein: No evidence of thrombus. Normal compressibility, respiratory phasicity and response to augmentation. Popliteal Vein: No evidence of thrombus. Normal compressibility, respiratory phasicity and response to augmentation. Calf Veins: No evidence of thrombus. Normal compressibility and flow on color Doppler imaging. Superficial Great Saphenous Vein: No evidence of thrombus. Normal compressibility. Venous Reflux:  None. Other Findings:  None. IMPRESSION: No evidence of DVT within either lower extremity. Electronically Signed   By: Sandi Mariscal M.D.   On: 06/22/2019 14:52   Dg Chest Port 1 View  Result Date: 06/22/2019 CLINICAL DATA:  Pt c/o swelling in lower extremities x several days. Hx CHF, obesity, diabetes, nonsmoker EXAM: PORTABLE CHEST 1 VIEW COMPARISON:  Chest radiographs dated 04/28/2019, 02/23/2019 FINDINGS: Stable  cardiomediastinal contours with enlarged heart size. Central vascular congestion without overt edema. No pneumothorax or pleural effusion. Visualized skeleton is unremarkable. IMPRESSION: Cardiomegaly and central vascular congestion without overt edema. Electronically Signed   By: Audie Pinto M.D.   On: 06/22/2019 10:53   Dg Hand Complete Left  Result Date: 07/07/2019 CLINICAL DATA:  Pain and swelling, first and second metacarpals. EXAM: LEFT HAND - COMPLETE 3+ VIEW COMPARISON:  None. FINDINGS: No fracture or dislocation of the left hand. Joint spaces are well preserved. Diffuse soft tissue edema about the hand. IMPRESSION: No fracture or dislocation  of the left hand. Joint spaces are well preserved. Diffuse soft tissue edema about the hand. Electronically Signed   By: Eddie Candle M.D.   On: 07/07/2019 21:18   Vas US Renal Artery Duplex  Result Date: 06/16/2019 ABDOMINAL VISCERAL High Risk Factors: Hypertension, hyperlipidemia, Diabetes, no history of                    smoking. Other Factors: Limited study secondary to obesity and bowel gas. Limitations: Air/bowel gas and obesity. Performing Technologist: Delorise Shiner RVT  Examination Guidelines: A complete evaluation includes B-mode imaging, spectral Doppler, color Doppler, and power Doppler as needed of all accessible portions of each vessel. Bilateral testing is considered an integral part of a complete examination. Limited examinations for reoccurring indications may be performed as noted.  Duplex Findings: +----------+--------+--------+------+--------+ MesentericPSV cm/sEDV cm/sPlaqueComments +----------+--------+--------+------+--------+ Aorta Mid    58                          +----------+--------+--------+------+--------+  +------------------+--------+--------+-------+ Right Renal ArteryPSV cm/sEDV cm/sComment +------------------+--------+--------+-------+ Proximal             25                    +------------------+--------+--------+-------+ Mid                  26                   +------------------+--------+--------+-------+ Distal               27                   +------------------+--------+--------+-------+ +-----------------+--------+--------+-------+ Left Renal ArteryPSV cm/sEDV cm/sComment +-----------------+--------+--------+-------+ Origin              40                   +-----------------+--------+--------+-------+ Proximal            29                   +-----------------+--------+--------+-------+ Mid                 27                   +-----------------+--------+--------+-------+ Distal              23                   +-----------------+--------+--------+-------+ +------------+--------+--------+--+-----------+--------+--------+---+ Right KidneyPSV cm/sEDV cm/sRILeft KidneyPSV cm/sEDV cm/sRI  +------------+--------+--------+--+-----------+--------+--------+---+ Upper Pole                    Upper Pole                     +------------+--------+--------+--+-----------+--------+--------+---+ Mid                           Mid                            +------------+--------+--------+--+-----------+--------+--------+---+ Lower Pole                    Lower Pole                     +------------+--------+--------+--+-----------+--------+--------+---+ Hilar  Hilar                          +------------+--------+--------+--+-----------+--------+--------+---+ +------------------+-------+------------------+-------+ Right Kidney             Left Kidney               +------------------+-------+------------------+-------+ RAR                      RAR                       +------------------+-------+------------------+-------+ RAR (manual)             RAR (manual)              +------------------+-------+------------------+-------+ Cortex                   Cortex                     +------------------+-------+------------------+-------+ Cortex thickness  1.88 mmCorex thickness   1.84 mm +------------------+-------+------------------+-------+ Kidney length (cm)13.27  Kidney length (cm)10.24   +------------------+-------+------------------+-------+  Summary: Renal:  Right: Normal size right kidney. No evidence of right renal artery        stenosis. RRV flow present. Limited study secondary to body        habitus. Could not insonnate right renal artery origin. Left:  Normal size of left kidney. No evidence of left renal artery        stenosis. LRV flow present. Limited study secondary to body        habitus.  *See table(s) above for measurements and observations.  Diagnosing physician: Ruta Hinds MD  Electronically signed by Ruta Hinds MD on 06/16/2019 at 10:43:24 AM.    Final     Labs:  CBC: Recent Labs    07/04/19 0449 07/06/19 0502 07/07/19 0500 07/08/19 0955  WBC 8.8 9.0 8.8 8.1  HGB 11.1* 9.5* 9.8* 10.4*  HCT 35.4* 30.9* 30.8* 32.9*  PLT 314 352 398 443*    COAGS: Recent Labs    02/20/19 0900 07/03/19 1318  INR  --  1.1  APTT 36  --     BMP: Recent Labs    07/05/19 1321 07/06/19 0502 07/07/19 0500 07/08/19 0955  NA 132* 132* 132* 133*  K 3.7 3.3* 3.5 3.6  CL 95* 96* 96* 95*  CO2 22 19* 21* 20*  GLUCOSE 252* 147* 197* 198*  BUN 78* 83* 88* 99*  CALCIUM 8.0* 7.7* 8.1* 9.0  CREATININE 6.10* 6.32* 6.56* 6.99*  GFRNONAA 10* 9* 9* 8*  GFRAA 11* 11* 10* 10*    LIVER FUNCTION TESTS: Recent Labs    02/20/19 0900 05/05/19 0114 06/22/19 1052  06/27/19 0441 07/06/19 0502 07/07/19 0500 07/08/19 0955  BILITOT 0.2* 0.2* 0.3  --   --   --   --   --   AST 15 11* 21  --   --   --   --   --   ALT 16 19 21   --   --   --   --   --   ALKPHOS 97 103 107  --   --   --   --   --   PROT 8.0 7.6 6.9  --   --   --   --   --   ALBUMIN 3.1* 3.2* 3.1*   < > 3.0* 2.3* 2.3* 2.2*   < > =  values in this interval not displayed.    TUMOR  MARKERS: No results for input(s): AFPTM, CEA, CA199, CHROMGRNA in the last 8760 hours.  Assessment and Plan:  Progressive renal failure now needing hemodialysis.  Will place a tunneled HD catheter today by [name].  Risks and benefits discussed with the patient including, but not limited to bleeding, infection, vascular injury, pneumothorax which may require chest tube placement, air embolism or even death  All of the patient's questions were answered, patient is agreeable to proceed. Consent signed and in chart.  Thank you for this interesting consult.  I greatly enjoyed meeting Kelly Key and look forward to participating in their care.  A copy of this report was sent to the requesting provider on this date.  Electronically Signed: Murrell Redden, PA-C   07/08/2019, 11:23 AM      I spent a total of 20 Minutes    in face to face in clinical consultation, greater than 50% of which was counseling/coordinating care for HD catheter.

## 2019-07-08 NOTE — Progress Notes (Signed)
PT Cancellation Note  Patient Details Name: Kelly Key MRN: [id number] DOB: January 14, 1971   Cancelled Treatment:    Reason Eval/Treat Not Completed: Fatigue/lethargy limiting ability to participate;Medical issues which prohibited therapy.  Pt declines to do therapy having just returned from HD cath procedure.  Will re-attempt at another time.   Ramond Dial 07/08/2019, 3:22 PM   Mee Hives, PT MS Acute Rehab Dept. Number: [place]

## 2019-07-09 ENCOUNTER — Encounter (HOSPITAL_COMMUNITY): Payer: Medicare Other

## 2019-07-09 LAB — CBC
HCT: 30.7 % — ABNORMAL LOW (ref 39.0–52.0)
Hemoglobin: 9.7 g/dL — ABNORMAL LOW (ref 13.0–17.0)
MCH: 24.1 pg — ABNORMAL LOW (ref 26.0–34.0)
MCHC: 31.6 g/dL (ref 30.0–36.0)
MCV: 76.2 fL — ABNORMAL LOW (ref 80.0–100.0)
Platelets: 429 10*3/uL — ABNORMAL HIGH (ref 150–400)
RBC: 4.03 MIL/uL — ABNORMAL LOW (ref 4.22–5.81)
RDW: 15.2 % (ref 11.5–15.5)
WBC: 8.1 10*3/uL (ref 4.0–10.5)
nRBC: 0 % (ref 0.0–0.2)

## 2019-07-09 LAB — GLUCOSE, CAPILLARY
Glucose-Capillary: 201 mg/dL — ABNORMAL HIGH (ref 70–99)
Glucose-Capillary: 258 mg/dL — ABNORMAL HIGH (ref 70–99)
Glucose-Capillary: 176 mg/dL — ABNORMAL HIGH (ref 70–99)
Glucose-Capillary: 243 mg/dL — ABNORMAL HIGH (ref 70–99)

## 2019-07-09 LAB — RENAL FUNCTION PANEL
Albumin: 2.1 g/dL — ABNORMAL LOW (ref 3.5–5.0)
Anion gap: 17 — ABNORMAL HIGH (ref 5–15)
BUN: 84 mg/dL — ABNORMAL HIGH (ref 6–20)
CO2: 22 mmol/L (ref 22–32)
Calcium: 8.5 mg/dL — ABNORMAL LOW (ref 8.9–10.3)
Chloride: 95 mmol/L — ABNORMAL LOW (ref 98–111)
Creatinine, Ser: 6.25 mg/dL — ABNORMAL HIGH (ref 0.61–1.24)
GFR calc Af Amer: 11 mL/min — ABNORMAL LOW (ref >60)
GFR calc non Af Amer: 10 mL/min — ABNORMAL LOW (ref >60)
Glucose, Bld: 202 mg/dL — ABNORMAL HIGH (ref 70–99)
Phosphorus: 8.2 mg/dL — ABNORMAL HIGH (ref 2.5–4.6)
Potassium: 3.7 mmol/L (ref 3.5–5.1)
Sodium: 134 mmol/L — ABNORMAL LOW (ref 135–145)

## 2019-07-09 LAB — IRON AND TIBC
Iron: 125 ug/dL (ref 45–182)
Saturation Ratios: 48 % — ABNORMAL HIGH (ref 17.9–39.5)
TIBC: 260 ug/dL (ref 250–450)
UIBC: 135 ug/dL

## 2019-07-09 LAB — FERRITIN: Ferritin: 449 ng/mL — ABNORMAL HIGH (ref 24–336)

## 2019-07-09 MED ORDER — TRAMADOL HCL 50 MG PO TABS
50.0000 mg | ORAL_TABLET | Freq: Once | ORAL | Status: DC
  Filled 2019-07-09 (×2): qty 1

## 2019-07-09 NOTE — Plan of Care (Signed)
  Problem: Education: Goal: Knowledge of General Education information will improve Description: Including pain rating scale, medication(s)/side effects and non-pharmacologic comfort measures Outcome: Progressing   Problem: Pain Managment: Goal: General experience of comfort will improve Outcome: Progressing   

## 2019-07-09 NOTE — Progress Notes (Signed)
Pt states that his chest hurts when he breathes at 7/10. Pt's vitals are WNL and sating at 97 on room air. Messaged NP on call to make aware. Pt was appreciative of RN mentioning it to the on-call.   Eleanora Neighbor, RN

## 2019-07-09 NOTE — Progress Notes (Signed)
Patient Demographics:    Kelly Key, is a 48 y.o. male, DOB - 1971-07-07, SAY:[id number]  Admit date - 07/03/2019   Admitting Physician Karmen Bongo, MD  Outpatient Primary MD for the patient is [name], MD  LOS - 4  HPI 48 y.o. male with medical history significant of obesity (BMI 2); HTN; HLD: DM; stage 4 CKD; and chronic combined CHF (EF 50-55% in 2013) presenting with increased LE edema, decreased ambulation - readmitted on 07/03/2019 with anasarca in the setting of dCHF exacerbation- -transfered to [facility] for tunneled HD catheter placement and initiation of hemodialysis  Chief Complaint  Patient presents with   Leg Swelling        Subjective:   No acute issues or events overnight, denies shortness of breath, chest pain, nausea, vomiting, diarrhea, constipation, headache, fevers, chills.  Somewhat anxious for dialysis catheter placement but otherwise no acute complaints.   Assessment  & Plan :    Principal Problem:   Anasarca Active Problems:   HTN (hypertension)   Morbid obesity (HCC)   CKD (chronic kidney disease) stage 5, GFR less than 15 ml/min (HCC)   Uncontrolled type 2 diabetes mellitus with hyperglycemia, with long-term current use of insulin (HCC)   Dyslipidemia   Chronic combined systolic and diastolic CHF (congestive heart failure) (HCC)   Volume overload   Congestive heart failure (HCC)   Anasarca, likely multifactorial - in the setting of HFpEF/worsening kidney function as below, POA, improving --last known EF per echo from 06/23/2019 is 50 to 55%, -patient with history of chronic low EF CHF--- presenting with acute on chronic preserved EF CHF exacerbation, -PTA patient was on torsemide 60 mg twice daily -Continue IV   IV Lasix 80 mg twice daily by nephrologist -Status post right IJ tunneled hemodialysis catheter with IR on 07/08/2019 -Nephrology following for  dialysis and assistance with management of his volume status  CKD 4- progressively worsening -now ESRD on dialysis -Prior renal ultrasound without obstructive uropathy -Nephrology following, appreciate insight recommendations, tolerated dialysis on the 11th quite well, repeat dialysis today -BUN, creatinine minimally improving with dialysis yesterday, continue to follow dialysis for volume control above -Patient continues to make moderate amount of urine, upwards of 3 L yesterday  HTN, essential -Workup for possible renal artery stenosis including MRI of renal arteries -  Dr Oneida Alar has scheduled 07/15/19 -Stable, continue Coreg 25 mg twice daily, clonidine 0.3 mg TID  Chronic anemia of chronic diease 2/2 CKD superimposed on iron deficiency anemia -Hemoglobin 9.7, minimally downtrending in the setting of dialysis, daily blood draws -Defer decision on EPO agent to nephrology service -Serum iron is 11, iron saturation 4 - s/p fereheme 9/10  Insulin dependent DM2, uncontrolled -last A1c 12.4, reflecting uncontrolled diabetes -Resume levemir - increase as necessary -currently 35 unit twice daily -Continue sliding scale insulin, hypoglycemic protocol  Intermittent chest discomfort/dyspnea on exertion, likely in the setting of poor volume control/ascites -Questionably effusion vs musculoskeletal in nature -Not a candidate for CTA chest due to renal function, chronic lower extremity edema is most likely due to renal and heart failure -patient had negative venous Dopplers of the lower extremity on 02/21/2019, also had repeat negative lower extremity Dopplers on 04/28/2019, lower extremity venous Dopplers were again  negative on 8/26/202- -continue DVT prophylaxis, low clinical index of suspicion for PE at this time  Code Status : FULL Disposition Plan:Remain inpatient for tunneled HD catheter placement and initiation of HD - disposition pending clinical improvement and possible need for dialysis  slot Consults  :  Nephrology/general surgery/IR  Procedures- - tunneled HD catheter placement 07/08/2019  DVT Prophylaxis  :   - Heparin - SCDs  Lab Results  Component Value Date   PLT 429 (H) 07/09/2019    Inpatient Medications  Scheduled Meds:  allopurinol  200 mg Oral Daily   aspirin EC  81 mg Oral Daily   atorvastatin  80 mg Oral Daily   carvedilol  25 mg Oral BID WC   Chlorhexidine Gluconate Cloth  6 each Topical Q0600   cloNIDine  0.3 mg Oral TID   ezetimibe  10 mg Oral QPM   fluticasone  1 spray Each Nare Daily   furosemide  80 mg Intravenous BID   gabapentin  100 mg Oral TID   heparin  5,000 Units Subcutaneous Q8H   insulin aspart  0-20 Units Subcutaneous TID WC   insulin aspart  0-5 Units Subcutaneous QHS   insulin detemir  35 Units Subcutaneous BID   linaclotide  145 mcg Oral QAC breakfast   mupirocin ointment  1 application Nasal BID   potassium chloride  20 mEq Oral Once   sevelamer carbonate  800 mg Oral TID WC   sodium chloride flush  3 mL Intravenous Q12H   Continuous Infusions:  sodium chloride     sodium chloride     sodium chloride     PRN Meds:.sodium chloride, sodium chloride, sodium chloride, acetaminophen, albuterol, alteplase, calcium carbonate (dosed in mg elemental calcium), camphor-menthol **AND** hydrOXYzine, docusate sodium, feeding supplement (NEPRO CARB STEADY), heparin, heparin, HYDROcodone-acetaminophen, ondansetron (ZOFRAN) IV, sodium chloride flush, sorbitol    Anti-infectives (From admission, onward)   Start     Dose/Rate Route Frequency Ordered Stop   07/08/19 1227  ceFAZolin (ANCEF) 2-4 GM/100ML-% IVPB    Note to Pharmacy: Manuela Neptune   : cabinet override      07/08/19 1227 07/08/19 1225   07/06/19 1045  ceFAZolin (ANCEF) 3 g in dextrose 5 % 50 mL IVPB  Status:  Discontinued     3 g 100 mL/hr over 30 Minutes Intravenous On call to O.R. 07/06/19 1037 07/06/19 1231        Objective:   Vitals:    07/08/19 2117 07/09/19 0350 07/09/19 0457 07/09/19 0633  BP: (!) 162/86  (!) 153/85   Pulse: 83  80   Resp: 16  18   Temp: (!) 97.5 F (36.4 C)  (!) 97.5 F (36.4 C) 98.3 F (36.8 C)  TempSrc: Oral  Oral Oral  SpO2: 94%  96%   Weight:  (!) 138 kg (!) 138.3 kg   Height:        Wt Readings from Last 3 Encounters:  07/09/19 (!) 138.3 kg  06/27/19 (!) 141.6 kg  06/16/19 (!) 138.8 kg     Intake/Output Summary (Last 24 hours) at 07/09/2019 0736 Last data filed at 07/09/2019 0600 Gross per 24 hour  Intake 523 ml  Output 4000 ml  Net -3477 ml     Physical Exam Gen:- Awake Alert, morbidly obese, dyspnea on exertion HEENT:- [place].AT, No sclera icterus Neck-Supple Neck,No JVD,.  Lungs-bibasilar rales without overt rhonchi or wheeze, no accessory muscle use CV- S1, S2 normal, regular ,  Abd-  +ve B.Sounds, Abd  Soft, increased truncal adiposity extremity/Skin: 3 + pitting edema/anasarca, pedal pulses present  Psych-affect is appropriate, oriented x3 Neuro-generalized weakness, no new focal deficits, no tremors   Data Review:   Micro Results Recent Results (from the past 240 hour(s))  SARS Coronavirus 2 [facility] order, Performed in [facility] hospital lab) Nasopharyngeal Nasopharyngeal Swab     Status: None   Collection Time: 07/03/19  4:08 PM   Specimen: Nasopharyngeal Swab  Result Value Ref Range Status   SARS Coronavirus 2 NEGATIVE NEGATIVE Final    Comment: (NOTE) If result is NEGATIVE SARS-CoV-2 target nucleic acids are NOT DETECTED. The SARS-CoV-2 RNA is generally detectable in upper and lower  respiratory specimens during the acute phase of infection. The lowest  concentration of SARS-CoV-2 viral copies this assay can detect is 250  copies / mL. A negative result does not preclude SARS-CoV-2 infection  and should not be used as the sole basis for treatment or other  patient management decisions.  A negative result may occur with  improper specimen collection /  handling, submission of specimen other  than nasopharyngeal swab, presence of viral mutation(s) within the  areas targeted by this assay, and inadequate number of viral copies  (<250 copies / mL). A negative result must be combined with clinical  observations, patient history, and epidemiological information. If result is POSITIVE SARS-CoV-2 target nucleic acids are DETECTED. The SARS-CoV-2 RNA is generally detectable in upper and lower  respiratory specimens dur ing the acute phase of infection.  Positive  results are indicative of active infection with SARS-CoV-2.  Clinical  correlation with patient history and other diagnostic information is  necessary to determine patient infection status.  Positive results do  not rule out bacterial infection or co-infection with other viruses. If result is PRESUMPTIVE POSTIVE SARS-CoV-2 nucleic acids MAY BE PRESENT.   A presumptive positive result was obtained on the submitted specimen  and confirmed on repeat testing.  While 2019 novel coronavirus  (SARS-CoV-2) nucleic acids may be present in the submitted sample  additional confirmatory testing may be necessary for epidemiological  and / or clinical management purposes  to differentiate between  SARS-CoV-2 and other Sarbecovirus currently known to infect humans.  If clinically indicated additional testing with an alternate test  methodology [phone]) is advised. The SARS-CoV-2 RNA is generally  detectable in upper and lower respiratory sp ecimens during the acute  phase of infection. The expected result is Negative. Fact Sheet for Patients:  StrictlyIdeas.no Fact Sheet for Healthcare Providers: BankingDealers.co.za This test is not yet approved or cleared by the [place] FDA and has been authorized for detection and/or diagnosis of SARS-CoV-2 by FDA under an Emergency Use Authorization (EUA).  This EUA will remain in effect (meaning this  test can be used) for the duration of the COVID-19 declaration under Section 564(b)(1) of the Act, 21 U.S.C. section 360bbb-3(b)(1), unless the authorization is terminated or revoked sooner. Performed at [facility], [address], [place], [place] [address]   Surgical PCR screen     Status: Abnormal   Collection Time: 07/06/19 12:57 AM   Specimen: Nasal Mucosa; Nasal Swab  Result Value Ref Range Status   MRSA, PCR POSITIVE (A) NEGATIVE Final   Staphylococcus aureus POSITIVE (A) NEGATIVE Final    Comment: RESULT CALLED TO, READ BACK BY AND VERIFIED WITH: J RHUE,RN _0  07/06/19 MKELLY (NOTE) The Xpert SA Assay (FDA approved for NASAL specimens in patients 60 years of age and older), is one component of a  comprehensive surveillance program. It is not intended to diagnose infection nor to guide or monitor treatment. Performed at [facility], [address] [place], [place] [address]     Radiology Reports Dg Chest 2 View  Result Date: 07/03/2019 CLINICAL DATA:  History of end-stage renal disease and CHF. EXAM: CHEST - 2 VIEW COMPARISON:  Chest radiograph 06/22/2019, 04/28/2019 FINDINGS: Stable cardiomediastinal contours with enlarged heart size. A few minimal linear opacities at the right lung base likely reflect atelectasis or scar. No new focal infiltrate or evidence of edema. No pneumothorax or pleural effusion. No acute findings in the visualized skeleton. IMPRESSION: No evidence of active disease. Electronically Signed   By: Audie Pinto M.D.   On: 07/03/2019 13:57   US Renal  Result Date: 06/23/2019 CLINICAL DATA:  Acute renal failure EXAM: RENAL / URINARY TRACT ULTRASOUND COMPLETE COMPARISON:  None. FINDINGS: Right Kidney: Renal measurements: 11.7 x 4.8 x 6.1 cm = volume: 177 mL . Echogenicity within normal limits. No mass or hydronephrosis visualized. Left Kidney: Renal measurements: 11.1 x 5.3 x 5.9 cm = volume: 181 mL. Echogenicity within normal limits. No mass or  hydronephrosis visualized. Bladder: Appears normal for degree of bladder distention. IMPRESSION: Normal bilateral renal ultrasound. Electronically Signed   By: Kathreen Devoid   On: 06/23/2019 11:46   US Venous Img Lower Bilateral  Result Date: 06/22/2019 CLINICAL DATA:  Bilateral lower extremity pain and edema. Evaluate for DVT. EXAM: BILATERAL LOWER EXTREMITY VENOUS DOPPLER ULTRASOUND TECHNIQUE: Gray-scale sonography with graded compression, as well as color Doppler and duplex ultrasound were performed to evaluate the lower extremity deep venous systems from the level of the common femoral vein and including the common femoral, femoral, profunda femoral, popliteal and calf veins including the posterior tibial, peroneal and gastrocnemius veins when visible. The superficial great saphenous vein was also interrogated. Spectral Doppler was utilized to evaluate flow at rest and with distal augmentation maneuvers in the common femoral, femoral and popliteal veins. COMPARISON:  None. FINDINGS: RIGHT LOWER EXTREMITY Common Femoral Vein: No evidence of thrombus. Normal compressibility, respiratory phasicity and response to augmentation. Saphenofemoral Junction: No evidence of thrombus. Normal compressibility and flow on color Doppler imaging. Profunda Femoral Vein: No evidence of thrombus. Normal compressibility and flow on color Doppler imaging. Femoral Vein: No evidence of thrombus. Normal compressibility, respiratory phasicity and response to augmentation. Popliteal Vein: No evidence of thrombus. Normal compressibility, respiratory phasicity and response to augmentation. Calf Veins: No evidence of thrombus. Normal compressibility and flow on color Doppler imaging. Superficial Great Saphenous Vein: No evidence of thrombus. Normal compressibility. Venous Reflux:  None. Other Findings:  None. LEFT LOWER EXTREMITY Common Femoral Vein: No evidence of thrombus. Normal compressibility, respiratory phasicity and response to  augmentation. Saphenofemoral Junction: No evidence of thrombus. Normal compressibility and flow on color Doppler imaging. Profunda Femoral Vein: No evidence of thrombus. Normal compressibility and flow on color Doppler imaging. Femoral Vein: No evidence of thrombus. Normal compressibility, respiratory phasicity and response to augmentation. Popliteal Vein: No evidence of thrombus. Normal compressibility, respiratory phasicity and response to augmentation. Calf Veins: No evidence of thrombus. Normal compressibility and flow on color Doppler imaging. Superficial Great Saphenous Vein: No evidence of thrombus. Normal compressibility. Venous Reflux:  None. Other Findings:  None. IMPRESSION: No evidence of DVT within either lower extremity. Electronically Signed   By: Sandi Mariscal M.D.   On: 06/22/2019 14:52   Ir Fluoro Guide Cv Line Right  Result Date: 07/08/2019 INDICATION: 48 year old male with a history of renal  failure EXAM: TUNNELED CENTRAL VENOUS HEMODIALYSIS CATHETER PLACEMENT WITH ULTRASOUND AND FLUOROSCOPIC GUIDANCE MEDICATIONS: 2 g Ancef. The antibiotic was given in an appropriate time interval prior to skin puncture. ANESTHESIA/SEDATION: Moderate (conscious) sedation was employed during this procedure. A total of Versed 2 mg and Fentanyl 100 mcg was administered intravenously. Moderate Sedation Time: 15 minutes. The patient's level of consciousness and vital signs were monitored continuously by radiology nursing throughout the procedure under my direct supervision. FLUOROSCOPY TIME:  Fluoroscopy Time: 0 minutes 48 seconds (7 mGy). COMPLICATIONS: None PROCEDURE: Informed written consent was obtained from the patient after a discussion of the risks, benefits, and alternatives to treatment. Questions regarding the procedure were encouraged and answered. The right neck and chest were prepped with chlorhexidine in a sterile fashion, and a sterile drape was applied covering the operative field. Maximum barrier  sterile technique with sterile gowns and gloves were used for the procedure. A timeout was performed prior to the initiation of the procedure. After creating a small venotomy incision, a micropuncture kit was utilized to access the right internal jugular vein under direct, real-time ultrasound guidance after the overlying soft tissues were anesthetized with 1% lidocaine with epinephrine. Ultrasound image documentation was performed. The microwire was marked to measure appropriate internal catheter length. External tunneled length was estimated. A total tip to cuff length of 23 cm was selected. Skin and subcutaneous tissues of chest wall below the clavicle were generously infiltrated with 1% lidocaine for local anesthesia. A small stab incision was made with 11 blade scalpel. The selected hemodialysis catheter was tunneled in a retrograde fashion from the anterior chest wall to the venotomy incision. A guidewire was advanced to the level of the IVC and the micropuncture sheath was exchanged for a peel-away sheath. The catheter was then placed through the peel-away sheath with tips ultimately positioned within the superior aspect of the right atrium. Final catheter positioning was confirmed and documented with a spot radiographic image. The catheter aspirates and flushes normally. The catheter was flushed with appropriate volume heparin dwells. The catheter exit site was secured with a 0-Prolene retention suture. The venotomy incision was closed Derma bond and sterile dressing. Dressings were applied at the chest wall. Patient tolerated the procedure well and remained hemodynamically stable throughout. No complications were encountered and no significant blood loss encountered. IMPRESSION: Status post right IJ tunneled hemodialysis catheter. Catheter ready for use. Signed, Dulcy Fanny. Dellia Nims, RPVI [facility] Radiology Electronically Signed   By: Corrie Mckusick D.O.    On: 07/08/2019 13:24   Ir US Guide Vasc Access Right  Result Date: 07/08/2019 INDICATION: 48 year old male with a history of renal failure EXAM: TUNNELED CENTRAL VENOUS HEMODIALYSIS CATHETER PLACEMENT WITH ULTRASOUND AND FLUOROSCOPIC GUIDANCE MEDICATIONS: 2 g Ancef. The antibiotic was given in an appropriate time interval prior to skin puncture. ANESTHESIA/SEDATION: Moderate (conscious) sedation was employed during this procedure. A total of Versed 2 mg and Fentanyl 100 mcg was administered intravenously. Moderate Sedation Time: 15 minutes. The patient's level of consciousness and vital signs were monitored continuously by radiology nursing throughout the procedure under my direct supervision. FLUOROSCOPY TIME:  Fluoroscopy Time: 0 minutes 48 seconds (7 mGy). COMPLICATIONS: None PROCEDURE: Informed written consent was obtained from the patient after a discussion of the risks, benefits, and alternatives to treatment. Questions regarding the procedure were encouraged and answered. The right neck and chest were prepped with chlorhexidine in a sterile fashion, and a sterile drape was applied covering the operative field.  Maximum barrier sterile technique with sterile gowns and gloves were used for the procedure. A timeout was performed prior to the initiation of the procedure. After creating a small venotomy incision, a micropuncture kit was utilized to access the right internal jugular vein under direct, real-time ultrasound guidance after the overlying soft tissues were anesthetized with 1% lidocaine with epinephrine. Ultrasound image documentation was performed. The microwire was marked to measure appropriate internal catheter length. External tunneled length was estimated. A total tip to cuff length of 23 cm was selected. Skin and subcutaneous tissues of chest wall below the clavicle were generously infiltrated with 1% lidocaine for local anesthesia. A small stab incision was made with 11 blade scalpel. The  selected hemodialysis catheter was tunneled in a retrograde fashion from the anterior chest wall to the venotomy incision. A guidewire was advanced to the level of the IVC and the micropuncture sheath was exchanged for a peel-away sheath. The catheter was then placed through the peel-away sheath with tips ultimately positioned within the superior aspect of the right atrium. Final catheter positioning was confirmed and documented with a spot radiographic image. The catheter aspirates and flushes normally. The catheter was flushed with appropriate volume heparin dwells. The catheter exit site was secured with a 0-Prolene retention suture. The venotomy incision was closed Derma bond and sterile dressing. Dressings were applied at the chest wall. Patient tolerated the procedure well and remained hemodynamically stable throughout. No complications were encountered and no significant blood loss encountered. IMPRESSION: Status post right IJ tunneled hemodialysis catheter. Catheter ready for use. Signed, Dulcy Fanny. Dellia Nims, RPVI [facility] Radiology Electronically Signed   By: Corrie Mckusick D.O.   On: 07/08/2019 13:24   Dg Chest Port 1 View  Result Date: 06/22/2019 CLINICAL DATA:  Pt c/o swelling in lower extremities x several days. Hx CHF, obesity, diabetes, nonsmoker EXAM: PORTABLE CHEST 1 VIEW COMPARISON:  Chest radiographs dated 04/28/2019, 02/23/2019 FINDINGS: Stable cardiomediastinal contours with enlarged heart size. Central vascular congestion without overt edema. No pneumothorax or pleural effusion. Visualized skeleton is unremarkable. IMPRESSION: Cardiomegaly and central vascular congestion without overt edema. Electronically Signed   By: Audie Pinto M.D.   On: 06/22/2019 10:53   Dg Hand Complete Left  Result Date: 07/07/2019 CLINICAL DATA:  Pain and swelling, first and second metacarpals. EXAM: LEFT HAND - COMPLETE 3+ VIEW COMPARISON:  None.  FINDINGS: No fracture or dislocation of the left hand. Joint spaces are well preserved. Diffuse soft tissue edema about the hand. IMPRESSION: No fracture or dislocation of the left hand. Joint spaces are well preserved. Diffuse soft tissue edema about the hand. Electronically Signed   By: Eddie Candle M.D.   On: 07/07/2019 21:18   Vas US Renal Artery Duplex  Result Date: 06/16/2019 ABDOMINAL VISCERAL High Risk Factors: Hypertension, hyperlipidemia, Diabetes, no history of                    smoking. Other Factors: Limited study secondary to obesity and bowel gas. Limitations: Air/bowel gas and obesity. Performing Technologist: Delorise Shiner RVT  Examination Guidelines: A complete evaluation includes B-mode imaging, spectral Doppler, color Doppler, and power Doppler as needed of all accessible portions of each vessel. Bilateral testing is considered an integral part of a complete examination. Limited examinations for reoccurring indications may be performed as noted.  Duplex Findings: +----------+--------+--------+------+--------+  Mesenteric PSV cm/s EDV cm/s Plaque Comments  +----------+--------+--------+------+--------+  Aorta Mid     58                              +----------+--------+--------+------+--------+  +------------------+--------+--------+-------+  Right Renal Artery PSV cm/s EDV cm/s Comment  +------------------+--------+--------+-------+  Proximal              25                      +------------------+--------+--------+-------+  Mid                   26                      +------------------+--------+--------+-------+  Distal                27                      +------------------+--------+--------+-------+ +-----------------+--------+--------+-------+  Left Renal Artery PSV cm/s EDV cm/s Comment  +-----------------+--------+--------+-------+  Origin               40                      +-----------------+--------+--------+-------+  Proximal             29                       +-----------------+--------+--------+-------+  Mid                  27                      +-----------------+--------+--------+-------+  Distal               23                      +-----------------+--------+--------+-------+ +------------+--------+--------+--+-----------+--------+--------+---+  Right Kidney PSV cm/s EDV cm/s RI Left Kidney PSV cm/s EDV cm/s RI   +------------+--------+--------+--+-----------+--------+--------+---+  Upper Pole                        Upper Pole                         +------------+--------+--------+--+-----------+--------+--------+---+  Mid                               Mid                                +------------+--------+--------+--+-----------+--------+--------+---+  Lower Pole                        Lower Pole                         +------------+--------+--------+--+-----------+--------+--------+---+  Hilar                             Hilar                              +------------+--------+--------+--+-----------+--------+--------+---+ +------------------+-------+------------------+-------+  Right Kidney               Left Kidney                 +------------------+-------+------------------+-------+  RAR  RAR                         +------------------+-------+------------------+-------+  RAR (manual)               RAR (manual)                +------------------+-------+------------------+-------+  Cortex                     Cortex                      +------------------+-------+------------------+-------+  Cortex thickness   1.88 mm Corex thickness    1.84 mm  +------------------+-------+------------------+-------+  Kidney length (cm) 13.27   Kidney length (cm) 10.24    +------------------+-------+------------------+-------+  Summary: Renal:  Right: Normal size right kidney. No evidence of right renal artery        stenosis. RRV flow present. Limited study secondary to body        habitus. Could not insonnate right renal artery origin. Left:   Normal size of left kidney. No evidence of left renal artery        stenosis. LRV flow present. Limited study secondary to body        habitus.  *See table(s) above for measurements and observations.  Diagnosing physician: Ruta Hinds MD  Electronically signed by Ruta Hinds MD on 06/16/2019 at 10:43:24 AM.    Final      CBC Recent Labs  Lab 07/04/19 0449 07/06/19 0502 07/07/19 0500 07/08/19 0955 07/09/19 0536  WBC 8.8 9.0 8.8 8.1 8.1  HGB 11.1* 9.5* 9.8* 10.4* 9.7*  HCT 35.4* 30.9* 30.8* 32.9* 30.7*  PLT 314 352 398 443* 429*  MCV 78.1* 78.0* 77.8* 76.2* 76.2*  MCH 24.5* 24.0* 24.7* 24.1* 24.1*  MCHC 31.4 30.7 31.8 31.6 31.6  RDW 15.1 15.1 15.5 15.3 15.2  LYMPHSABS 1.9  --   --   --   --   MONOABS 1.1*  --   --   --   --   EOSABS 0.2  --   --   --   --   BASOSABS 0.1  --   --   --   --     Chemistries  Recent Labs  Lab 07/05/19 0459 07/05/19 1321 07/06/19 0502 07/07/19 0500 07/08/19 0955 07/09/19 0536  NA 134* 132* 132* 132* 133* 134*  K 3.2* 3.7 3.3* 3.5 3.6 3.7  CL 97* 95* 96* 96* 95* 95*  CO2 22 22 19* 21* 20* 22  GLUCOSE 114* 252* 147* 197* 198* 202*  BUN 75* 78* 83* 88* 99* 84*  CREATININE 5.84* 6.10* 6.32* 6.56* 6.99* 6.25*  CALCIUM 7.9* 8.0* 7.7* 8.1* 9.0 8.5*  MG 2.1  --   --   --   --   --    --------------------------------------------------------------------------------------------------  Lab Results  Component Value Date   HGBA1C 12.1 (H) 06/23/2019   -------------------------------------------------------------------------------------------------------------------------------------------------------------------------------------------------------------------- Recent Labs    07/06/19 0840  FERRITIN 216  TIBC 278  IRON 11*    Coagulation profile Recent Labs  Lab 07/03/19 1318  INR 1.1   ------------------------------------------------------------------------------------------------------------------    Component Value Date/Time    BNP 119.0 (H) 06/22/2019 1053    Little Ishikawa M.D on 07/09/2019 at 7:36 AM  Go to [email or web address] - for contact info  [facility] - Office  [phone]

## 2019-07-09 NOTE — Progress Notes (Signed)
Shela Commons, RN called and made aware that the patient's HD treament has been moved to 07/10/19

## 2019-07-09 NOTE — Progress Notes (Addendum)
[facility]    NEPHROLOGY PROGRESS NOTE  SUBJECTIVE: Patient seen and examined earlier this afternoon.  Denied any chest pain, shortness of breath, nausea, vomiting, diarrhea or dysuria.  All other review of systems are negative.  Status post tunneled dialysis catheter placement and first HD yesterday.   OBJECTIVE:  Vitals:   07/09/19 0633 07/09/19 0933  BP:  (!) 165/85  Pulse:  81  Resp:  18  Temp: 98.3 F (36.8 C) 98.1 F (36.7 C)  SpO2:  91%    Intake/Output Summary (Last 24 hours) at 07/09/2019 1234 Last data filed at 07/09/2019 0900 Gross per 24 hour  Intake 760 ml  Output 3450 ml  Net -2690 ml      General:  AAOx3 NAD HEENT: MMM [place] AT anicteric sclera Neck:  No JVD, no adenopathy, (+)TDC CV:  Heart RRR  Lungs:  L/S CTA bilaterally Abd:  abd SNT/ND with normal BS GU:  Bladder non-palpable Extremities: +3 bilateral lower extremity edema. Skin:  No skin rash  MEDICATIONS:  . allopurinol  200 mg Oral Daily  . aspirin EC  81 mg Oral Daily  . atorvastatin  80 mg Oral Daily  . carvedilol  25 mg Oral BID WC  . Chlorhexidine Gluconate Cloth  6 each Topical Q0600  . cloNIDine  0.3 mg Oral TID  . ezetimibe  10 mg Oral QPM  . fluticasone  1 spray Each Nare Daily  . furosemide  80 mg Intravenous BID  . gabapentin  100 mg Oral TID  . heparin  5,000 Units Subcutaneous Q8H  . insulin aspart  0-20 Units Subcutaneous TID WC  . insulin aspart  0-5 Units Subcutaneous QHS  . insulin detemir  35 Units Subcutaneous BID  . linaclotide  145 mcg Oral QAC breakfast  . mupirocin ointment  1 application Nasal BID  . potassium chloride  20 mEq Oral Once  . sevelamer carbonate  800 mg Oral TID WC  . sodium chloride flush  3 mL Intravenous Q12H       LABS:   CBC Latest Ref Rng & Units 07/09/2019 07/08/2019 07/07/2019  WBC 4.0 - 10.5 K/uL 8.1 8.1 8.8  Hemoglobin 13.0 - 17.0 g/dL 9.7(L) 10.4(L) 9.8(L)  Hematocrit 39.0 - 52.0 % 30.7(L) 32.9(L) 30.8(L)  Platelets  150 - 400 K/uL 429(H) 443(H) 398    CMP Latest Ref Rng & Units 07/09/2019 07/08/2019 07/07/2019  Glucose 70 - 99 mg/dL 202(H) 198(H) 197(H)  BUN 6 - 20 mg/dL 84(H) 99(H) 88(H)  Creatinine 0.61 - 1.24 mg/dL 6.25(H) 6.99(H) 6.56(H)  Sodium 135 - 145 mmol/L 134(L) 133(L) 132(L)  Potassium 3.5 - 5.1 mmol/L 3.7 3.6 3.5  Chloride 98 - 111 mmol/L 95(L) 95(L) 96(L)  CO2 22 - 32 mmol/L 22 20(L) 21(L)  Calcium 8.9 - 10.3 mg/dL 8.5(L) 9.0 8.1(L)  Total Protein 6.5 - 8.1 g/dL - - -  Total Bilirubin 0.3 - 1.2 mg/dL - - -  Alkaline Phos 38 - 126 U/L - - -  AST 15 - 41 U/L - - -  ALT 0 - 44 U/L - - -    Lab Results  Component Value Date   CALCIUM 8.5 (L) 07/09/2019   PHOS 8.2 (H) 07/09/2019       Component Value Date/Time   COLORURINE STRAW (A) 06/23/2019 0500   APPEARANCEUR CLEAR 06/23/2019 0500   LABSPEC 1.010 06/23/2019 0500   PHURINE 6.0 06/23/2019 0500   GLUCOSEU >=500 (A) 06/23/2019 0500   HGBUR SMALL (A) 06/23/2019  0500   BILIRUBINUR NEGATIVE 06/23/2019 0500   KETONESUR NEGATIVE 06/23/2019 0500   PROTEINUR >=300 (A) 06/23/2019 0500   UROBILINOGEN 0.2 06/11/2012 0518   NITRITE NEGATIVE 06/23/2019 0500   LEUKOCYTESUR NEGATIVE 06/23/2019 0500   No results found for: PHART, PCO2ART, PO2ART, HCO3, TCO2, ACIDBASEDEF, O2SAT     Component Value Date/Time   IRON 11 (L) 07/06/2019 0840   TIBC 278 07/06/2019 0840   FERRITIN 216 07/06/2019 0840   IRONPCTSAT 4 (L) 07/06/2019 0840       ASSESSMENT/PLAN:    Lockie Mola Asheis an 48 y.o.malewitha PMH significant for dCHF, DM, HTN, HLD, morbid obesity, fatty liver disease, and CKD stage 4 followed by Dr. Lowanda Foster with nephrotic range proteinuria (documented in past as >11 grams/24 hours) who was recently hospitalized on 06/22/2019 [facility] with a 2 week history of worsening lower extremity edema and abdominal girth not responsive to increasing doses of torsemide as an outpatient. He has had accelerated HTN and workup for renal artery stenosis was  underway with [name] however that has not been scheduled.During that hospitalization there were extensive discussions with the pt educating him about the need to initiate dialysis but he refused wanting to give a trial with outpt diuretics. In the past Mr. Dubois hadexpressed that he would "rather die than go on dialysis" and is followed by Dr. Lowanda Foster, however on further discussion he always states that he's not ready to die. Patient went home with torsemide 60mg  BID and unfortunately did not respond even though he was compliant.   1.  End-stage renal disease.  First dialysis today.  Status post [facility] 07/08/2019 and first HD.  2nd HD tomorrow.  Patient is very upset about the discussion of a permanent vascular access placement, stating he doesn't want to go through it.  Will readdress in the future.  2.  Hypertension.  Should improve with ultrafiltration.  3.  Acute on chronic diastolic congestive heart failure.  Should improve with dialysis.  4.  Malignant hypertension.  MRA pending of the renal arteries.  Will monitor blood pressure with dialysis.  Doubt intervention on any significant renal artery stenosis would make a difference in his renal function.  5.  Diabetes mellitus.  Overall poor control with a hemoglobin A1c of 12.4.  6.  Volume overload.  Should improve with ultrafiltration on dialysis.  7.  Anemia.  Check iron studies  8.  BMD. Cont  phosphate binders.   [place], DO, [place]

## 2019-07-10 LAB — CBC
HCT: 30.2 % — ABNORMAL LOW (ref 39.0–52.0)
Hemoglobin: 9.7 g/dL — ABNORMAL LOW (ref 13.0–17.0)
MCH: 24.4 pg — ABNORMAL LOW (ref 26.0–34.0)
MCHC: 32.1 g/dL (ref 30.0–36.0)
MCV: 76.1 fL — ABNORMAL LOW (ref 80.0–100.0)
Platelets: 481 10*3/uL — ABNORMAL HIGH (ref 150–400)
RBC: 3.97 MIL/uL — ABNORMAL LOW (ref 4.22–5.81)
RDW: 15.4 % (ref 11.5–15.5)
WBC: 8.3 10*3/uL (ref 4.0–10.5)
nRBC: 0 % (ref 0.0–0.2)

## 2019-07-10 LAB — RENAL FUNCTION PANEL
Albumin: 2.1 g/dL — ABNORMAL LOW (ref 3.5–5.0)
Anion gap: 18 — ABNORMAL HIGH (ref 5–15)
BUN: 95 mg/dL — ABNORMAL HIGH (ref 6–20)
CO2: 24 mmol/L (ref 22–32)
Calcium: 8.5 mg/dL — ABNORMAL LOW (ref 8.9–10.3)
Chloride: 93 mmol/L — ABNORMAL LOW (ref 98–111)
Creatinine, Ser: 6.46 mg/dL — ABNORMAL HIGH (ref 0.61–1.24)
GFR calc Af Amer: 11 mL/min — ABNORMAL LOW (ref >60)
GFR calc non Af Amer: 9 mL/min — ABNORMAL LOW (ref >60)
Glucose, Bld: 180 mg/dL — ABNORMAL HIGH (ref 70–99)
Phosphorus: 8.8 mg/dL — ABNORMAL HIGH (ref 2.5–4.6)
Potassium: 3.5 mmol/L (ref 3.5–5.1)
Sodium: 135 mmol/L (ref 135–145)

## 2019-07-10 LAB — GLUCOSE, CAPILLARY
Glucose-Capillary: 136 mg/dL — ABNORMAL HIGH (ref 70–99)
Glucose-Capillary: 184 mg/dL — ABNORMAL HIGH (ref 70–99)
Glucose-Capillary: 247 mg/dL — ABNORMAL HIGH (ref 70–99)

## 2019-07-10 MED ORDER — AMLODIPINE BESYLATE 10 MG PO TABS
10.0000 mg | ORAL_TABLET | Freq: Every day | ORAL | Status: DC
  Administered 2019-07-10 – 2019-07-16 (×6): 10 mg via ORAL
  Filled 2019-07-10 (×6): qty 1

## 2019-07-10 MED ORDER — DARBEPOETIN ALFA 40 MCG/0.4ML IJ SOSY: 40.0000 ug | PREFILLED_SYRINGE | INTRAMUSCULAR | Status: DC

## 2019-07-10 NOTE — Procedures (Signed)
I evaluated the patient while on hemodialysis.  I reviewed the patient's treatment plan.  Patient tolerating dialysis well.  Discussed with RN.  

## 2019-07-10 NOTE — Progress Notes (Signed)
Patient Demographics:    Kelly Key, is a 48 y.o. male, DOB - 1971-10-27, BCW:[id number]  Admit date - 07/03/2019   Admitting Physician Karmen Bongo, MD  Outpatient Primary MD for the patient is [name], MD  LOS - 5  HPI 48 y.o. male with medical history significant of obesity (BMI 60); HTN; HLD: DM; stage 4 CKD; and chronic combined CHF (EF 50-55% in 2013) presenting with increased LE edema, decreased ambulation - readmitted on 07/03/2019 with anasarca in the setting of dCHF exacerbation- -transfered to [facility] for tunneled HD catheter placement and initiation of hemodialysis.  Patient admitted as above with worsening kidney function in the setting of heart failure exacerbation and volume overload.  Patient now tolerating dialysis quite well, continues to diurese somewhat well but in general clinically improving.  Currently awaiting further evaluation with nephrology for ongoing dialysis and outpatient dialysis slot.  Chief Complaint  Patient presents with   Leg Swelling        Subjective:   No acute issues or events overnight, patient continues to complain of general malaise but otherwise denies overt chest pain, shortness of breath, nausea, vomiting, diarrhea, constipation.  Patient states his ambulation is still somewhat limited but markedly improving from previous.  Edema also improving.   Assessment  & Plan :    Principal Problem:   Anasarca Active Problems:   HTN (hypertension)   Morbid obesity (HCC)   CKD (chronic kidney disease) stage 5, GFR less than 15 ml/min (HCC)   Uncontrolled type 2 diabetes mellitus with hyperglycemia, with long-term current use of insulin (HCC)   Dyslipidemia   Chronic combined systolic and diastolic CHF (congestive heart failure) (HCC)   Volume overload   Congestive heart failure (HCC)   Anasarca, likely multifactorial - in the setting of  HFpEF/worsening kidney function as below, POA, improving --last known EF per echo from 06/23/2019 is 50 to 55%, -patient with history of chronic low EF CHF--- presenting with acute on chronic preserved EF CHF exacerbation, -PTA patient was on torsemide 60 mg twice daily -Continue IV Lasix 80 mg twice daily  -urine output downtrending in the setting of ongoing dialysis -Status post right IJ tunneled hemodialysis catheter with IR on 07/08/2019 -Nephrology following for dialysis and assistance with management of his volume status; mild hypocalcemia, hyperphosphatemia -likely to be reactive with dialysis  CKD 4- progressively worsening -now ESRD on dialysis -Prior renal ultrasound without obstructive uropathy -Nephrology following, appreciate insight recommendations, tolerated dialysis quite well -Patient continues to make moderate amount of urine -although downtrending with dialysis  HTN, essential -Workup for possible renal artery stenosis including MRI of renal arteries -  Dr Oneida Alar has scheduled this on 07/15/19 -Stable, continue Coreg 25 mg twice daily, clonidine 0.3 mg TID, amlodipine 10 daily  Chronic anemia of chronic diease 2/2 CKD superimposed on iron deficiency anemia -Hemoglobin 9.7 and stable -attempt to minimize blood draws -Defer decision on EPO agent to nephrology service -Serum iron is 11, iron saturation 4 - s/p fereheme 9/10  Insulin dependent DM2, uncontrolled -last A1c 12.4, reflecting uncontrolled diabetes -Resume levemir - currently 35 unit twice daily -moderately well controlled currently -Continue sliding scale insulin, hypoglycemic protocol  Intermittent chest discomfort/dyspnea on exertion, likely in the setting of  poor volume control/ascites -Questionably effusion vs musculoskeletal in nature -Not a candidate for CTA chest due to renal function, chronic lower extremity edema is most likely due to renal and heart failure -patient had negative venous Dopplers of the  lower extremity on 02/21/2019, also had repeat negative lower extremity Dopplers on 04/28/2019, lower extremity venous Dopplers were again negative on 8/26/202- -continue DVT prophylaxis, low clinical index of suspicion for PE at this time  Code Status : FULL Disposition Plan:Remain inpatient -previously requiring tunneled HD catheter placement and initiation of HD - disposition pending clinical improvement and need for dialysis slot Consults  :  Nephrology/general surgery/IR Procedures- - tunneled HD catheter placement 07/08/2019 DVT Prophylaxis  :  Heparin  Lab Results  Component Value Date   PLT 481 (H) 07/10/2019    Inpatient Medications  Scheduled Meds:  allopurinol  200 mg Oral Daily   aspirin EC  81 mg Oral Daily   atorvastatin  80 mg Oral Daily   carvedilol  25 mg Oral BID WC   Chlorhexidine Gluconate Cloth  6 each Topical Q0600   cloNIDine  0.3 mg Oral TID   ezetimibe  10 mg Oral QPM   fluticasone  1 spray Each Nare Daily   furosemide  80 mg Intravenous BID   gabapentin  100 mg Oral TID   heparin  5,000 Units Subcutaneous Q8H   insulin aspart  0-20 Units Subcutaneous TID WC   insulin aspart  0-5 Units Subcutaneous QHS   insulin detemir  35 Units Subcutaneous BID   linaclotide  145 mcg Oral QAC breakfast   mupirocin ointment  1 application Nasal BID   potassium chloride  20 mEq Oral Once   sevelamer carbonate  800 mg Oral TID WC   sodium chloride flush  3 mL Intravenous Q12H   traMADol  50 mg Oral Once   Continuous Infusions:  sodium chloride     sodium chloride     sodium chloride     PRN Meds:.sodium chloride, sodium chloride, sodium chloride, acetaminophen, albuterol, alteplase, calcium carbonate (dosed in mg elemental calcium), camphor-menthol **AND** hydrOXYzine, docusate sodium, feeding supplement (NEPRO CARB STEADY), heparin, heparin, HYDROcodone-acetaminophen, ondansetron (ZOFRAN) IV, sodium chloride flush, sorbitol    Anti-infectives  (From admission, onward)   Start     Dose/Rate Route Frequency Ordered Stop   07/08/19 1227  ceFAZolin (ANCEF) 2-4 GM/100ML-% IVPB    Note to Pharmacy: Manuela Neptune   : cabinet override      07/08/19 1227 07/08/19 1225   07/06/19 1045  ceFAZolin (ANCEF) 3 g in dextrose 5 % 50 mL IVPB  Status:  Discontinued     3 g 100 mL/hr over 30 Minutes Intravenous On call to O.R. 07/06/19 1037 07/06/19 1231        Objective:   Vitals:   07/10/19 0700 07/10/19 0703 07/10/19 0710 07/10/19 0730  BP: (!) 192/108 (!) 194/104 (!) 199/106 (!) 172/91  Pulse: 88 89 89 84  Resp: 18     Temp: 97.8 F (36.6 C)     TempSrc: Oral     SpO2: 98%     Weight: (!) 138.3 kg     Height:        Wt Readings from Last 3 Encounters:  07/10/19 (!) 138.3 kg  06/27/19 (!) 141.6 kg  06/16/19 (!) 138.8 kg     Intake/Output Summary (Last 24 hours) at 07/10/2019 0745 Last data filed at 07/10/2019 0200 Gross per 24 hour  Intake 840 ml  Output  1350 ml  Net -510 ml     Physical Exam Gen:- Awake Alert, resting comfortably in bed, appears nontoxic HEENT:- [place].AT, No sclera icterus Neck-Supple Neck,No JVD,.  Lungs-scant bibasilar rales without overt rhonchi or wheeze, no accessory muscle use CV- S1, S2 normal, regular ,  Abd-  +ve B.Sounds, Abd Soft, increased truncal adiposity  Extremities-1-2+ pitting edema distally in lower extremities bilaterally Psych-affect is appropriate, oriented x3 Neuro-generalized weakness, no new focal deficits, no tremors   Data Review:   Micro Results Recent Results (from the past 240 hour(s))  SARS Coronavirus 2 [facility] order, Performed in [facility] hospital lab) Nasopharyngeal Nasopharyngeal Swab     Status: None   Collection Time: 07/03/19  4:08 PM   Specimen: Nasopharyngeal Swab  Result Value Ref Range Status   SARS Coronavirus 2 NEGATIVE NEGATIVE Final    Comment: (NOTE) If result is NEGATIVE SARS-CoV-2 target nucleic acids are NOT DETECTED. The SARS-CoV-2 RNA is  generally detectable in upper and lower  respiratory specimens during the acute phase of infection. The lowest  concentration of SARS-CoV-2 viral copies this assay can detect is 250  copies / mL. A negative result does not preclude SARS-CoV-2 infection  and should not be used as the sole basis for treatment or other  patient management decisions.  A negative result may occur with  improper specimen collection / handling, submission of specimen other  than nasopharyngeal swab, presence of viral mutation(s) within the  areas targeted by this assay, and inadequate number of viral copies  (<250 copies / mL). A negative result must be combined with clinical  observations, patient history, and epidemiological information. If result is POSITIVE SARS-CoV-2 target nucleic acids are DETECTED. The SARS-CoV-2 RNA is generally detectable in upper and lower  respiratory specimens dur ing the acute phase of infection.  Positive  results are indicative of active infection with SARS-CoV-2.  Clinical  correlation with patient history and other diagnostic information is  necessary to determine patient infection status.  Positive results do  not rule out bacterial infection or co-infection with other viruses. If result is PRESUMPTIVE POSTIVE SARS-CoV-2 nucleic acids MAY BE PRESENT.   A presumptive positive result was obtained on the submitted specimen  and confirmed on repeat testing.  While 2019 novel coronavirus  (SARS-CoV-2) nucleic acids may be present in the submitted sample  additional confirmatory testing may be necessary for epidemiological  and / or clinical management purposes  to differentiate between  SARS-CoV-2 and other Sarbecovirus currently known to infect humans.  If clinically indicated additional testing with an alternate test  methodology [phone]) is advised. The SARS-CoV-2 RNA is generally  detectable in upper and lower respiratory sp ecimens during the acute  phase of  infection. The expected result is Negative. Fact Sheet for Patients:  StrictlyIdeas.no Fact Sheet for Healthcare Providers: BankingDealers.co.za This test is not yet approved or cleared by the [place] FDA and has been authorized for detection and/or diagnosis of SARS-CoV-2 by FDA under an Emergency Use Authorization (EUA).  This EUA will remain in effect (meaning this test can be used) for the duration of the COVID-19 declaration under Section 564(b)(1) of the Act, 21 U.S.C. section 360bbb-3(b)(1), unless the authorization is terminated or revoked sooner. Performed at [facility], [address] [place], [place] [address]   Surgical PCR screen     Status: Abnormal   Collection Time: 07/06/19 12:57 AM   Specimen: Nasal Mucosa; Nasal Swab  Result Value Ref Range Status   MRSA,  PCR POSITIVE (A) NEGATIVE Final   Staphylococcus aureus POSITIVE (A) NEGATIVE Final    Comment: RESULT CALLED TO, READ BACK BY AND VERIFIED WITH: J RHUE,RN @0419  07/06/19 MKELLY (NOTE) The Xpert SA Assay (FDA approved for NASAL specimens in patients 46 years of age and older), is one component of a comprehensive surveillance program. It is not intended to diagnose infection nor to guide or monitor treatment. Performed at [facility], [address], [place], [place] [address]     Radiology Reports Dg Chest 2 View  Result Date: 07/03/2019 CLINICAL DATA:  History of end-stage renal disease and CHF. EXAM: CHEST - 2 VIEW COMPARISON:  Chest radiograph 06/22/2019, 04/28/2019 FINDINGS: Stable cardiomediastinal contours with enlarged heart size. A few minimal linear opacities at the right lung base likely reflect atelectasis or scar. No new focal infiltrate or evidence of edema. No pneumothorax or pleural effusion. No acute findings in the visualized skeleton. IMPRESSION: No evidence of active disease. Electronically Signed   By: Audie Pinto M.D.   On: 07/03/2019  13:57   US Renal  Result Date: 06/23/2019 CLINICAL DATA:  Acute renal failure EXAM: RENAL / URINARY TRACT ULTRASOUND COMPLETE COMPARISON:  None. FINDINGS: Right Kidney: Renal measurements: 11.7 x 4.8 x 6.1 cm = volume: 177 mL . Echogenicity within normal limits. No mass or hydronephrosis visualized. Left Kidney: Renal measurements: 11.1 x 5.3 x 5.9 cm = volume: 181 mL. Echogenicity within normal limits. No mass or hydronephrosis visualized. Bladder: Appears normal for degree of bladder distention. IMPRESSION: Normal bilateral renal ultrasound. Electronically Signed   By: Kathreen Devoid   On: 06/23/2019 11:46   US Venous Img Lower Bilateral  Result Date: 06/22/2019 CLINICAL DATA:  Bilateral lower extremity pain and edema. Evaluate for DVT. EXAM: BILATERAL LOWER EXTREMITY VENOUS DOPPLER ULTRASOUND TECHNIQUE: Gray-scale sonography with graded compression, as well as color Doppler and duplex ultrasound were performed to evaluate the lower extremity deep venous systems from the level of the common femoral vein and including the common femoral, femoral, profunda femoral, popliteal and calf veins including the posterior tibial, peroneal and gastrocnemius veins when visible. The superficial great saphenous vein was also interrogated. Spectral Doppler was utilized to evaluate flow at rest and with distal augmentation maneuvers in the common femoral, femoral and popliteal veins. COMPARISON:  None. FINDINGS: RIGHT LOWER EXTREMITY Common Femoral Vein: No evidence of thrombus. Normal compressibility, respiratory phasicity and response to augmentation. Saphenofemoral Junction: No evidence of thrombus. Normal compressibility and flow on color Doppler imaging. Profunda Femoral Vein: No evidence of thrombus. Normal compressibility and flow on color Doppler imaging. Femoral Vein: No evidence of thrombus. Normal compressibility, respiratory phasicity and response to augmentation. Popliteal Vein: No evidence of thrombus. Normal  compressibility, respiratory phasicity and response to augmentation. Calf Veins: No evidence of thrombus. Normal compressibility and flow on color Doppler imaging. Superficial Great Saphenous Vein: No evidence of thrombus. Normal compressibility. Venous Reflux:  None. Other Findings:  None. LEFT LOWER EXTREMITY Common Femoral Vein: No evidence of thrombus. Normal compressibility, respiratory phasicity and response to augmentation. Saphenofemoral Junction: No evidence of thrombus. Normal compressibility and flow on color Doppler imaging. Profunda Femoral Vein: No evidence of thrombus. Normal compressibility and flow on color Doppler imaging. Femoral Vein: No evidence of thrombus. Normal compressibility, respiratory phasicity and response to augmentation. Popliteal Vein: No evidence of thrombus. Normal compressibility, respiratory phasicity and response to augmentation. Calf Veins: No evidence of thrombus. Normal compressibility and flow on color Doppler imaging. Superficial Great Saphenous Vein: No evidence of  thrombus. Normal compressibility. Venous Reflux:  None. Other Findings:  None. IMPRESSION: No evidence of DVT within either lower extremity. Electronically Signed   By: Sandi Mariscal M.D.   On: 06/22/2019 14:52   Ir Fluoro Guide Cv Line Right  Result Date: 07/08/2019 INDICATION: 48 year old male with a history of renal failure EXAM: TUNNELED CENTRAL VENOUS HEMODIALYSIS CATHETER PLACEMENT WITH ULTRASOUND AND FLUOROSCOPIC GUIDANCE MEDICATIONS: 2 g Ancef. The antibiotic was given in an appropriate time interval prior to skin puncture. ANESTHESIA/SEDATION: Moderate (conscious) sedation was employed during this procedure. A total of Versed 2 mg and Fentanyl 100 mcg was administered intravenously. Moderate Sedation Time: 15 minutes. The patient's level of consciousness and vital signs were monitored continuously by radiology nursing throughout the procedure under my direct supervision. FLUOROSCOPY TIME:   Fluoroscopy Time: 0 minutes 48 seconds (7 mGy). COMPLICATIONS: None PROCEDURE: Informed written consent was obtained from the patient after a discussion of the risks, benefits, and alternatives to treatment. Questions regarding the procedure were encouraged and answered. The right neck and chest were prepped with chlorhexidine in a sterile fashion, and a sterile drape was applied covering the operative field. Maximum barrier sterile technique with sterile gowns and gloves were used for the procedure. A timeout was performed prior to the initiation of the procedure. After creating a small venotomy incision, a micropuncture kit was utilized to access the right internal jugular vein under direct, real-time ultrasound guidance after the overlying soft tissues were anesthetized with 1% lidocaine with epinephrine. Ultrasound image documentation was performed. The microwire was marked to measure appropriate internal catheter length. External tunneled length was estimated. A total tip to cuff length of 23 cm was selected. Skin and subcutaneous tissues of chest wall below the clavicle were generously infiltrated with 1% lidocaine for local anesthesia. A small stab incision was made with 11 blade scalpel. The selected hemodialysis catheter was tunneled in a retrograde fashion from the anterior chest wall to the venotomy incision. A guidewire was advanced to the level of the IVC and the micropuncture sheath was exchanged for a peel-away sheath. The catheter was then placed through the peel-away sheath with tips ultimately positioned within the superior aspect of the right atrium. Final catheter positioning was confirmed and documented with a spot radiographic image. The catheter aspirates and flushes normally. The catheter was flushed with appropriate volume heparin dwells. The catheter exit site was secured with a 0-Prolene retention suture. The venotomy incision was closed Derma bond and sterile dressing. Dressings were  applied at the chest wall. Patient tolerated the procedure well and remained hemodynamically stable throughout. No complications were encountered and no significant blood loss encountered. IMPRESSION: Status post right IJ tunneled hemodialysis catheter. Catheter ready for use. Signed, Dulcy Fanny. Dellia Nims, RPVI [facility] Electronically Signed   By: Corrie Mckusick D.O.   On: 07/08/2019 13:24   Ir US Guide Vasc Access Right  Result Date: 07/08/2019 INDICATION: 48 year old male with a history of renal failure EXAM: TUNNELED CENTRAL VENOUS HEMODIALYSIS CATHETER PLACEMENT WITH ULTRASOUND AND FLUOROSCOPIC GUIDANCE MEDICATIONS: 2 g Ancef. The antibiotic was given in an appropriate time interval prior to skin puncture. ANESTHESIA/SEDATION: Moderate (conscious) sedation was employed during this procedure. A total of Versed 2 mg and Fentanyl 100 mcg was administered intravenously. Moderate Sedation Time: 15 minutes. The patient's level of consciousness and vital signs were monitored continuously by radiology nursing throughout the procedure under my direct supervision. FLUOROSCOPY TIME:  Fluoroscopy Time: 0 minutes 48 seconds (7  mGy). COMPLICATIONS: None PROCEDURE: Informed written consent was obtained from the patient after a discussion of the risks, benefits, and alternatives to treatment. Questions regarding the procedure were encouraged and answered. The right neck and chest were prepped with chlorhexidine in a sterile fashion, and a sterile drape was applied covering the operative field. Maximum barrier sterile technique with sterile gowns and gloves were used for the procedure. A timeout was performed prior to the initiation of the procedure. After creating a small venotomy incision, a micropuncture kit was utilized to access the right internal jugular vein under direct, real-time ultrasound guidance after the overlying soft tissues were anesthetized with 1%  lidocaine with epinephrine. Ultrasound image documentation was performed. The microwire was marked to measure appropriate internal catheter length. External tunneled length was estimated. A total tip to cuff length of 23 cm was selected. Skin and subcutaneous tissues of chest wall below the clavicle were generously infiltrated with 1% lidocaine for local anesthesia. A small stab incision was made with 11 blade scalpel. The selected hemodialysis catheter was tunneled in a retrograde fashion from the anterior chest wall to the venotomy incision. A guidewire was advanced to the level of the IVC and the micropuncture sheath was exchanged for a peel-away sheath. The catheter was then placed through the peel-away sheath with tips ultimately positioned within the superior aspect of the right atrium. Final catheter positioning was confirmed and documented with a spot radiographic image. The catheter aspirates and flushes normally. The catheter was flushed with appropriate volume heparin dwells. The catheter exit site was secured with a 0-Prolene retention suture. The venotomy incision was closed Derma bond and sterile dressing. Dressings were applied at the chest wall. Patient tolerated the procedure well and remained hemodynamically stable throughout. No complications were encountered and no significant blood loss encountered. IMPRESSION: Status post right IJ tunneled hemodialysis catheter. Catheter ready for use. Signed, Dulcy Fanny. Dellia Nims, RPVI [facility] Electronically Signed   By: Corrie Mckusick D.O.   On: 07/08/2019 13:24   Dg Chest Port 1 View  Result Date: 06/22/2019 CLINICAL DATA:  Pt c/o swelling in lower extremities x several days. Hx CHF, obesity, diabetes, nonsmoker EXAM: PORTABLE CHEST 1 VIEW COMPARISON:  Chest radiographs dated 04/28/2019, 02/23/2019 FINDINGS: Stable cardiomediastinal contours with enlarged heart size. Central vascular congestion  without overt edema. No pneumothorax or pleural effusion. Visualized skeleton is unremarkable. IMPRESSION: Cardiomegaly and central vascular congestion without overt edema. Electronically Signed   By: Audie Pinto M.D.   On: 06/22/2019 10:53   Dg Hand Complete Left  Result Date: 07/07/2019 CLINICAL DATA:  Pain and swelling, first and second metacarpals. EXAM: LEFT HAND - COMPLETE 3+ VIEW COMPARISON:  None. FINDINGS: No fracture or dislocation of the left hand. Joint spaces are well preserved. Diffuse soft tissue edema about the hand. IMPRESSION: No fracture or dislocation of the left hand. Joint spaces are well preserved. Diffuse soft tissue edema about the hand. Electronically Signed   By: Eddie Candle M.D.   On: 07/07/2019 21:18   Vas US Renal Artery Duplex  Result Date: 06/16/2019 ABDOMINAL VISCERAL High Risk Factors: Hypertension, hyperlipidemia, Diabetes, no history of                    smoking. Other Factors: Limited study secondary to obesity and bowel gas. Limitations: Air/bowel gas and obesity. Performing Technologist: Delorise Shiner RVT  Examination Guidelines: A complete evaluation includes B-mode imaging, spectral Doppler, color Doppler, and power Doppler  as needed of all accessible portions of each vessel. Bilateral testing is considered an integral part of a complete examination. Limited examinations for reoccurring indications may be performed as noted.  Duplex Findings: +----------+--------+--------+------+--------+  Mesenteric PSV cm/s EDV cm/s Plaque Comments  +----------+--------+--------+------+--------+  Aorta Mid     58                              +----------+--------+--------+------+--------+  +------------------+--------+--------+-------+  Right Renal Artery PSV cm/s EDV cm/s Comment  +------------------+--------+--------+-------+  Proximal              25                      +------------------+--------+--------+-------+  Mid                   26                       +------------------+--------+--------+-------+  Distal                27                      +------------------+--------+--------+-------+ +-----------------+--------+--------+-------+  Left Renal Artery PSV cm/s EDV cm/s Comment  +-----------------+--------+--------+-------+  Origin               40                      +-----------------+--------+--------+-------+  Proximal             29                      +-----------------+--------+--------+-------+  Mid                  27                      +-----------------+--------+--------+-------+  Distal               23                      +-----------------+--------+--------+-------+ +------------+--------+--------+--+-----------+--------+--------+---+  Right Kidney PSV cm/s EDV cm/s RI Left Kidney PSV cm/s EDV cm/s RI   +------------+--------+--------+--+-----------+--------+--------+---+  Upper Pole                        Upper Pole                         +------------+--------+--------+--+-----------+--------+--------+---+  Mid                               Mid                                +------------+--------+--------+--+-----------+--------+--------+---+  Lower Pole                        Lower Pole                         +------------+--------+--------+--+-----------+--------+--------+---+  Hilar  Hilar                              +------------+--------+--------+--+-----------+--------+--------+---+ +------------------+-------+------------------+-------+  Right Kidney               Left Kidney                 +------------------+-------+------------------+-------+  RAR                        RAR                         +------------------+-------+------------------+-------+  RAR (manual)               RAR (manual)                +------------------+-------+------------------+-------+  Cortex                     Cortex                      +------------------+-------+------------------+-------+  Cortex thickness   1.88  mm Corex thickness    1.84 mm  +------------------+-------+------------------+-------+  Kidney length (cm) 13.27   Kidney length (cm) 10.24    +------------------+-------+------------------+-------+  Summary: Renal:  Right: Normal size right kidney. No evidence of right renal artery        stenosis. RRV flow present. Limited study secondary to body        habitus. Could not insonnate right renal artery origin. Left:  Normal size of left kidney. No evidence of left renal artery        stenosis. LRV flow present. Limited study secondary to body        habitus.  *See table(s) above for measurements and observations.  Diagnosing physician: Ruta Hinds MD  Electronically signed by Ruta Hinds MD on 06/16/2019 at 10:43:24 AM.    Final      CBC Recent Labs  Lab 07/04/19 0449 07/06/19 0502 07/07/19 0500 07/08/19 0955 07/09/19 0536 07/10/19 0412  WBC 8.8 9.0 8.8 8.1 8.1 8.3  HGB 11.1* 9.5* 9.8* 10.4* 9.7* 9.7*  HCT 35.4* 30.9* 30.8* 32.9* 30.7* 30.2*  PLT 314 352 398 443* 429* 481*  MCV 78.1* 78.0* 77.8* 76.2* 76.2* 76.1*  MCH 24.5* 24.0* 24.7* 24.1* 24.1* 24.4*  MCHC 31.4 30.7 31.8 31.6 31.6 32.1  RDW 15.1 15.1 15.5 15.3 15.2 15.4  LYMPHSABS 1.9  --   --   --   --   --   MONOABS 1.1*  --   --   --   --   --   EOSABS 0.2  --   --   --   --   --   BASOSABS 0.1  --   --   --   --   --     Chemistries  Recent Labs  Lab 07/05/19 0459  07/06/19 0502 07/07/19 0500 07/08/19 0955 07/09/19 0536 07/10/19 0412  NA 134*   < > 132* 132* 133* 134* 135  K 3.2*   < > 3.3* 3.5 3.6 3.7 3.5  CL 97*   < > 96* 96* 95* 95* 93*  CO2 22   < > 19* 21* 20* 22 24  GLUCOSE 114*   < > 147* 197* 198* 202* 180*  BUN 75*   < > 83* 88* 99* 84* 95*  CREATININE 5.84*   < >  6.32* 6.56* 6.99* 6.25* 6.46*  CALCIUM 7.9*   < > 7.7* 8.1* 9.0 8.5* 8.5*  MG 2.1  --   --   --   --   --   --    < > = values in this interval not displayed.    --------------------------------------------------------------------------------------------------  Lab Results  Component Value Date   HGBA1C 12.1 (H) 06/23/2019   -------------------------------------------------------------------------------------------------------------------------------------------------------------------------------------------------------------------- Recent Labs    07/09/19 1312  FERRITIN 449*  TIBC 260  IRON 125    Coagulation profile Recent Labs  Lab 07/03/19 1318  INR 1.1   ------------------------------------------------------------------------------------------------------------------    Component Value Date/Time   BNP 119.0 (H) 06/22/2019 [place] DO on 07/10/2019 at 7:45 AM  Go to [email or web address] - for contact info  [facility] - Office  [phone]

## 2019-07-10 NOTE — Progress Notes (Addendum)
[facility]    NEPHROLOGY PROGRESS NOTE  SUBJECTIVE: Kelly Key seen and examined on dialysis.  ROS negative.  Status post tunneled dialysis catheter placement and first HD on Friday, 2nd Rx today   OBJECTIVE:  Vitals:   07/10/19 0800 07/10/19 0830  BP: (!) 167/88 140/88  Pulse: 87 87  Resp:    Temp:    SpO2:      Intake/Output Summary (Last 24 hours) at 07/10/2019 [id number] Last data filed at 07/10/2019 0200 Gross per 24 hour  Intake 600 ml  Output 800 ml  Net -200 ml      General:  AAOx3 NAD HEENT: MMM [place] AT anicteric sclera Neck:  No JVD, no adenopathy, (+)TDC CV:  Heart RRR  Lungs:  L/S CTA bilaterally Abd:  abd SNT/ND with normal BS GU:  Bladder non-palpable Extremities: +3 bilateral lower extremity edema. Skin:  No skin rash  MEDICATIONS:  . allopurinol  200 mg Oral Daily  . aspirin EC  81 mg Oral Daily  . atorvastatin  80 mg Oral Daily  . carvedilol  25 mg Oral BID WC  . Chlorhexidine Gluconate Cloth  6 each Topical Q0600  . cloNIDine  0.3 mg Oral TID  . ezetimibe  10 mg Oral QPM  . fluticasone  1 spray Each Nare Daily  . furosemide  80 mg Intravenous BID  . gabapentin  100 mg Oral TID  . heparin  5,000 Units Subcutaneous Q8H  . insulin aspart  0-20 Units Subcutaneous TID WC  . insulin aspart  0-5 Units Subcutaneous QHS  . insulin detemir  35 Units Subcutaneous BID  . linaclotide  145 mcg Oral QAC breakfast  . mupirocin ointment  1 application Nasal BID  . potassium chloride  20 mEq Oral Once  . sevelamer carbonate  800 mg Oral TID WC  . sodium chloride flush  3 mL Intravenous Q12H  . traMADol  50 mg Oral Once       LABS:   CBC Latest Ref Rng & Units 07/10/2019 07/09/2019 07/08/2019  WBC 4.0 - 10.5 K/uL 8.3 8.1 8.1  Hemoglobin 13.0 - 17.0 g/dL 9.7(L) 9.7(L) 10.4(L)  Hematocrit 39.0 - 52.0 % 30.2(L) 30.7(L) 32.9(L)  Platelets 150 - 400 K/uL 481(H) 429(H) 443(H)    CMP Latest Ref Rng & Units 07/10/2019 07/09/2019 07/08/2019  Glucose 70 - 99  mg/dL 180(H) 202(H) 198(H)  BUN 6 - 20 mg/dL 95(H) 84(H) 99(H)  Creatinine 0.61 - 1.24 mg/dL 6.46(H) 6.25(H) 6.99(H)  Sodium 135 - 145 mmol/L 135 134(L) 133(L)  Potassium 3.5 - 5.1 mmol/L 3.5 3.7 3.6  Chloride 98 - 111 mmol/L 93(L) 95(L) 95(L)  CO2 22 - 32 mmol/L 24 22 20(L)  Calcium 8.9 - 10.3 mg/dL 8.5(L) 8.5(L) 9.0  Total Protein 6.5 - 8.1 g/dL - - -  Total Bilirubin 0.3 - 1.2 mg/dL - - -  Alkaline Phos 38 - 126 U/L - - -  AST 15 - 41 U/L - - -  ALT 0 - 44 U/L - - -    Lab Results  Component Value Date   CALCIUM 8.5 (L) 07/10/2019   PHOS 8.8 (H) 07/10/2019       Component Value Date/Time   COLORURINE STRAW (A) 06/23/2019 0500   APPEARANCEUR CLEAR 06/23/2019 0500   LABSPEC 1.010 06/23/2019 0500   PHURINE 6.0 06/23/2019 0500   GLUCOSEU >=500 (A) 06/23/2019 0500   HGBUR SMALL (A) 06/23/2019 0500   BILIRUBINUR NEGATIVE 06/23/2019 0500   KETONESUR NEGATIVE 06/23/2019 0500  PROTEINUR >=300 (A) 06/23/2019 0500   UROBILINOGEN 0.2 06/11/2012 0518   NITRITE NEGATIVE 06/23/2019 0500   LEUKOCYTESUR NEGATIVE 06/23/2019 0500   No results found for: PHART, PCO2ART, PO2ART, HCO3, TCO2, ACIDBASEDEF, O2SAT     Component Value Date/Time   IRON 125 07/09/2019 1312   TIBC 260 07/09/2019 1312   FERRITIN 449 (H) 07/09/2019 1312   IRONPCTSAT 48 (H) 07/09/2019 1312       ASSESSMENT/PLAN:    Kelly Key an 48 y.o.malewitha PMH significant for dCHF, DM, HTN, HLD, morbid obesity, fatty liver disease, and CKD stage 4 followed by Kelly Key with nephrotic range proteinuria (documented in past as >11 grams/24 hours) who was recently hospitalized on 06/22/2019 [facility] with a 2 week history of worsening lower extremity edema and abdominal girth not responsive to increasing doses of torsemide as an outpatient. He has had accelerated HTN and workup for renal artery stenosis was underway with Kelly Key however that has not been scheduled.During that hospitalization there were extensive  discussions with the pt educating him about the need to initiate dialysis but he refused wanting to give a trial with outpt diuretics. In the past Kelly Key hadexpressed that he would "rather die than go on dialysis" and is followed by Kelly Key, however on further discussion he always states that he's not ready to die. Kelly Key went home with torsemide 60mg  BID and unfortunately did not respond even though he was compliant.   1.  End-stage renal disease.  First dialysis today.  Status post [facility] 07/08/2019 and first HD.  2nd HD today.  Kelly Key is very upset about the discussion of a permanent vascular access placement, stating he doesn't want to go through it.  Will readdress in the future.  2.  Hypertension.  Should improve with ultrafiltration.  Will restart home dose of amlodipine.  3.  Acute on chronic diastolic congestive heart failure. Improving with dialysis.  4.  Malignant hypertension.  MRA pending of the renal arteries.  Will monitor blood pressure with dialysis.  Doubt intervention on any significant renal artery stenosis would make a difference in his renal function.    5.  Diabetes mellitus.  Overall poor control with a hemoglobin A1c of 12.4.  6.  Volume overload.  Should improve with ultrafiltration on dialysis.  7.  Anemia.  Is iron replete.  Will dose aranesp once BP improved  8.  BMD. Cont  phosphate binders.   [place], DO, [place]

## 2019-07-11 LAB — RENAL FUNCTION PANEL
Albumin: 2.2 g/dL — ABNORMAL LOW (ref 3.5–5.0)
Anion gap: 16 — ABNORMAL HIGH (ref 5–15)
BUN: 71 mg/dL — ABNORMAL HIGH (ref 6–20)
CO2: 24 mmol/L (ref 22–32)
Calcium: 8.7 mg/dL — ABNORMAL LOW (ref 8.9–10.3)
Chloride: 93 mmol/L — ABNORMAL LOW (ref 98–111)
Creatinine, Ser: 5.96 mg/dL — ABNORMAL HIGH (ref 0.61–1.24)
GFR calc Af Amer: 12 mL/min — ABNORMAL LOW (ref >60)
GFR calc non Af Amer: 10 mL/min — ABNORMAL LOW (ref >60)
Glucose, Bld: 166 mg/dL — ABNORMAL HIGH (ref 70–99)
Phosphorus: 7.4 mg/dL — ABNORMAL HIGH (ref 2.5–4.6)
Potassium: 3.4 mmol/L — ABNORMAL LOW (ref 3.5–5.1)
Sodium: 133 mmol/L — ABNORMAL LOW (ref 135–145)

## 2019-07-11 LAB — GLUCOSE, CAPILLARY
Glucose-Capillary: 166 mg/dL — ABNORMAL HIGH (ref 70–99)
Glucose-Capillary: 198 mg/dL — ABNORMAL HIGH (ref 70–99)
Glucose-Capillary: 201 mg/dL — ABNORMAL HIGH (ref 70–99)
Glucose-Capillary: 235 mg/dL — ABNORMAL HIGH (ref 70–99)
Glucose-Capillary: 238 mg/dL — ABNORMAL HIGH (ref 70–99)

## 2019-07-11 LAB — HEPATITIS B CORE ANTIBODY, IGM: Hep B C IgM: NEGATIVE

## 2019-07-11 LAB — HEPATITIS B SURFACE ANTIBODY,QUALITATIVE: Hep B S Ab: REACTIVE

## 2019-07-11 LAB — HEPATITIS B SURFACE ANTIGEN: Hepatitis B Surface Ag: NEGATIVE

## 2019-07-11 MED ORDER — PHENYLEPHRINE HCL-NACL 10-0.9 MG/250ML-% IV SOLN
INTRAVENOUS | Status: AC
  Filled 2019-07-11: qty 250

## 2019-07-11 NOTE — Progress Notes (Signed)
[facility]    NEPHROLOGY PROGRESS NOTE  SUBJECTIVE: Patient seen and examined.  ROS negative.  Status post tunneled dialysis catheter placement and first HD on Friday, 2nd Rx Sunday.  Next HD Tuesday.  Complaining of generalized weakness and difficulty walking.   OBJECTIVE:  Vitals:   07/11/19 0440 07/11/19 1003  BP: (!) 156/97 (!) 142/81  Pulse: 86 87  Resp: 18 18  Temp: 97.7 F (36.5 C) 97.7 F (36.5 C)  SpO2: 98% 99%    Intake/Output Summary (Last 24 hours) at 07/11/2019 1530 Last data filed at 07/11/2019 1105 Gross per 24 hour  Intake 683 ml  Output 1150 ml  Net -467 ml      General:  AAOx3 NAD HEENT: MMM [place] AT anicteric sclera Neck:  No JVD, no adenopathy, (+)TDC CV:  Heart RRR  Lungs:  L/S CTA bilaterally Abd:  abd SNT/ND with normal BS GU:  Bladder non-palpable Extremities: +3 bilateral lower extremity edema. Skin:  No skin rash  MEDICATIONS:  . allopurinol  200 mg Oral Daily  . amLODipine  10 mg Oral Daily  . aspirin EC  81 mg Oral Daily  . atorvastatin  80 mg Oral Daily  . carvedilol  25 mg Oral BID WC  . Chlorhexidine Gluconate Cloth  6 each Topical Q0600  . cloNIDine  0.3 mg Oral TID  . ezetimibe  10 mg Oral QPM  . fluticasone  1 spray Each Nare Daily  . furosemide  80 mg Intravenous BID  . gabapentin  100 mg Oral TID  . heparin  5,000 Units Subcutaneous Q8H  . insulin aspart  0-20 Units Subcutaneous TID WC  . insulin aspart  0-5 Units Subcutaneous QHS  . insulin detemir  35 Units Subcutaneous BID  . linaclotide  145 mcg Oral QAC breakfast  . potassium chloride  20 mEq Oral Once  . sevelamer carbonate  800 mg Oral TID WC  . sodium chloride flush  3 mL Intravenous Q12H  . traMADol  50 mg Oral Once       LABS:   CBC Latest Ref Rng & Units 07/10/2019 07/09/2019 07/08/2019  WBC 4.0 - 10.5 K/uL 8.3 8.1 8.1  Hemoglobin 13.0 - 17.0 g/dL 9.7(L) 9.7(L) 10.4(L)  Hematocrit 39.0 - 52.0 % 30.2(L) 30.7(L) 32.9(L)  Platelets 150 - 400  K/uL 481(H) 429(H) 443(H)    CMP Latest Ref Rng & Units 07/11/2019 07/10/2019 07/09/2019  Glucose 70 - 99 mg/dL 166(H) 180(H) 202(H)  BUN 6 - 20 mg/dL 71(H) 95(H) 84(H)  Creatinine 0.61 - 1.24 mg/dL 5.96(H) 6.46(H) 6.25(H)  Sodium 135 - 145 mmol/L 133(L) 135 134(L)  Potassium 3.5 - 5.1 mmol/L 3.4(L) 3.5 3.7  Chloride 98 - 111 mmol/L 93(L) 93(L) 95(L)  CO2 22 - 32 mmol/L 24 24 22   Calcium 8.9 - 10.3 mg/dL 8.7(L) 8.5(L) 8.5(L)  Total Protein 6.5 - 8.1 g/dL - - -  Total Bilirubin 0.3 - 1.2 mg/dL - - -  Alkaline Phos 38 - 126 U/L - - -  AST 15 - 41 U/L - - -  ALT 0 - 44 U/L - - -    Lab Results  Component Value Date   CALCIUM 8.7 (L) 07/11/2019   PHOS 7.4 (H) 07/11/2019       Component Value Date/Time   COLORURINE STRAW (A) 06/23/2019 0500   APPEARANCEUR CLEAR 06/23/2019 0500   LABSPEC 1.010 06/23/2019 0500   PHURINE 6.0 06/23/2019 0500   GLUCOSEU >=500 (A) 06/23/2019 0500   HGBUR  SMALL (A) 06/23/2019 0500   BILIRUBINUR NEGATIVE 06/23/2019 0500   KETONESUR NEGATIVE 06/23/2019 0500   PROTEINUR >=300 (A) 06/23/2019 0500   UROBILINOGEN 0.2 06/11/2012 0518   NITRITE NEGATIVE 06/23/2019 0500   LEUKOCYTESUR NEGATIVE 06/23/2019 0500   No results found for: PHART, PCO2ART, PO2ART, HCO3, TCO2, ACIDBASEDEF, O2SAT     Component Value Date/Time   IRON 125 07/09/2019 1312   TIBC 260 07/09/2019 1312   FERRITIN 449 (H) 07/09/2019 1312   IRONPCTSAT 48 (H) 07/09/2019 1312       ASSESSMENT/PLAN:    Daundre Huntsberger Asheis an 48 y.o.malewitha PMH significant for dCHF, DM, HTN, HLD, morbid obesity, fatty liver disease, and CKD stage 4 followed by Dr. Lowanda Foster with nephrotic range proteinuria (documented in past as >11 grams/24 hours) who was recently hospitalized on 06/22/2019 [facility] with a 2 week history of worsening lower extremity edema and abdominal girth not responsive to increasing doses of torsemide as an outpatient. He has had accelerated HTN and workup for renal artery stenosis was underway  with [name] however that has not been scheduled.During that hospitalization there were extensive discussions with the pt educating him about the need to initiate dialysis but he refused wanting to give a trial with outpt diuretics. In the past Mr. Armor hadexpressed that he would "rather die than go on dialysis" and is followed by Dr. Lowanda Foster, however on further discussion he always states that he's not ready to die. Patient went home with torsemide 60mg  BID and unfortunately did not respond even though he was compliant.   1.  End-stage renal disease. Status post [facility] 07/08/2019 and first HD.  2nd HD today.  Patient is very upset about the discussion of a permanent vascular access placement, stating he doesn't want to go through it.  Will readdress in the future.  2.  Hypertension.  Should improve with ultrafiltration.  On home dose of amlodipine.  3.  Acute on chronic diastolic congestive heart failure. Improving with dialysis.  4.  Malignant hypertension.  MRA pending of the renal arteries.  Will monitor blood pressure with dialysis.  Doubt intervention on any significant renal artery stenosis would make a difference in his renal function.    5.  Diabetes mellitus.  Overall poor control with a hemoglobin A1c of 12.4.  6.  Volume overload.  Should improve with ultrafiltration on dialysis.  7.  Anemia.  Is iron replete.  Will dose aranesp once BP improved  8.  BMD. Cont  phosphate binders.   [place], DO, [place]

## 2019-07-11 NOTE — Progress Notes (Signed)
Physical Therapy Treatment Patient Details Name: Kelly Key MRN: [id number] DOB: 1971-02-07 Today's Date: 07/11/2019    History of Present Illness Kelly Key is a 48 y.o. male with medical history significant of obesity (BMI 49); HTN; HLD: DM; stage 4 CKD; and chronic combined CHF (EF 50-55% in 2013) presenting with increased LE edema, decreased ability to ambulate.  He felt better when he left the hospital.  During the week, his legs got weak and he fell once.  He started developing worsening LE edema.  It has been particularly bad for the last 3 days.  He denies SOB.  His weight has "probably" changed.  He can't lie flat because his feet hurt too much and feel like they weigh too much.  No PND.  No fevers.  He does not want to go to rehab.    PT Comments    Continuing work on functional mobility and activity tolerance;  Reports feeling weak and tired this afternoon, but still motivated to go home and willing to work with PT; Noting heavy dependence on RW for support with ambulation; significantly incr fall risk; I would like for him to be stronger and safer before discharging home; He is young, motivated, and medically complex being new to HD; he will have friends' support at home; I believe it is in his best interest to consider a CIR stay for intensive therapies to maximize independence and safety with mobility prior to dc home; Placed CIR admission screen for their consideration.   Follow Up Recommendations  CIR(Will place CIR screen)     Equipment Recommendations  Rolling walker with 5" wheels;3in1 (PT);Other (comment)(may consider Insurance claims handler; If he goes home, we should consider medical Lucianne Lei transport for HD)    Recommendations for Other Services OT consult(will order per protocol)     Precautions / Restrictions Precautions Precautions: Fall    Mobility  Bed Mobility Overal bed mobility: Modified Independent             General bed mobility comments: increased time,  labored movement  Transfers Overall transfer level: Needs assistance Equipment used: Rolling walker (2 wheeled) Transfers: Sit to/from Stand Sit to Stand: Min guard         General transfer comment: Cues for hand placement and safety; noted dependent on weight shift/momentum and then pushes up with UEs on RW to acheive fully upright standing  Ambulation/Gait Ambulation/Gait assistance: Min assist Gait Distance (Feet): 12 Feet Assistive device: Rolling walker (2 wheeled) Gait Pattern/deviations: Decreased step length - right;Decreased step length - left;Trunk flexed Gait velocity: decreased   General Gait Details: Cues to self-monitor for activiity tolerance; Very weak LEs with heavy dependence on RW for support; Very close guard with hands on assist due to fall risk   Stairs             Wheelchair Mobility    Modified Rankin (Stroke Patients Only)       Balance     Sitting balance-Leahy Scale: Good       Standing balance-Leahy Scale: Fair Standing balance comment: using RW                            Cognition Arousal/Alertness: Awake/alert Behavior During Therapy: WFL for tasks assessed/performed Overall Cognitive Status: Within Functional Limits for tasks assessed  Exercises      General Comments        Pertinent Vitals/Pain Pain Assessment: Faces Faces Pain Scale: Hurts little more Pain Location: RUE at HD access site Pain Descriptors / Indicators: Aching;Sore Pain Intervention(s): Monitored during session    Home Living                      Prior Function            PT Goals (current goals can now be found in the care plan section) Acute Rehab PT Goals Patient Stated Goal: return home PT Goal Formulation: With patient Time For Goal Achievement: 07/22/19 Potential to Achieve Goals: Good Progress towards PT goals: Progressing toward goals(Slowly)     Frequency    Min 3X/week      PT Plan Discharge plan needs to be updated    Co-evaluation              AM-PAC PT "6 Clicks" Mobility   Outcome Measure  Help needed turning from your back to your side while in a flat bed without using bedrails?: None Help needed moving from lying on your back to sitting on the side of a flat bed without using bedrails?: None Help needed moving to and from a bed to a chair (including a wheelchair)?: A Little Help needed standing up from a chair using your arms (e.g., wheelchair or bedside chair)?: A Little Help needed to walk in hospital room?: A Lot Help needed climbing 3-5 steps with a railing? : Total 6 Click Score: 17    End of Session Equipment Utilized During Treatment: Gait belt Activity Tolerance: Patient limited by fatigue Patient left: with call bell/phone within reach;in chair Nurse Communication: Mobility status PT Visit Diagnosis: Unsteadiness on feet (R26.81);Other abnormalities of gait and mobility (R26.89);Muscle weakness (generalized) (M62.81);History of falling (Z91.81)     Time: 1350-1416 PT Time Calculation (min) (ACUTE ONLY): 26 min  Charges:  $Gait Training: 8-22 mins $Therapeutic Activity: 8-22 mins                     Roney Marion, PT  Acute Rehabilitation Services Pager [phone] Office [place] 07/11/2019, 3:52 PM

## 2019-07-11 NOTE — Clinical Social Work Note (Signed)
Received call from [facility] stating patient has been accepted for chair at [facility] facility.  Schedule is TTS @ noon.  Arrive 30 minutes early day one.

## 2019-07-11 NOTE — Progress Notes (Addendum)
Renal Navigator met with patient and his fiance at bedside to provide him with OP HD clinic information and seat time. They state understanding, appreciation and no questions at this time. OP HD clinic/[name] is prepared for patient to start as soon as tomorrow, Tuesday, 9/15 if patient is ready for discharge today. Renal Navigator sent message to Nephrologist/Dr. Maryjane Hurter and Attending/Dr. Avon Gully. Renal Navigator will notify OP HD clinic of discharge plan and patient start date in clinic once this has been determined.  [name], [place] Renal Navigator [phone]

## 2019-07-11 NOTE — Care Management Important Message (Signed)
Important Message  Patient Details  Name: Kelly Key MRN: [id number] Date of Birth: 1970-12-26   Medicare Important Message Given:  Yes     [name] [address] 07/11/2019, 4:03 PM

## 2019-07-11 NOTE — Consult Note (Signed)
   [facility] CM Inpatient Consult   07/11/2019  Kelly Key 02/26/1971 [id number]  Patient screened for extreme high risk score for unplanned readmission score  and/or for hospitalizations to check if potential Price Management services are needed.  Review of patient's medical record reveals patient is from MD notes from HPI as follows, the patient is a:  48 y.o.malewith medical history significant ofobesity (BMI 49); HTN; HLD: DM; stage 4 CKD; and chronic combined CHF (EF 50-55% in 2013) presenting with increased LE edema, decreased ambulation - readmitted on 07/03/2019 with anasarca in the setting of dCHF exacerbation- -transfered to [place] for tunneled HD catheter placement and initiation of hemodialysis  Primary Care Provider is Dr. [name]  Plan:  Follow for disposition.  Call the patient's room however no answer. Notified inpatient TOC that the patient is referred for post hospital follow up.   Please place a [facility] Care Management consult as appropriate and for questions contact:   Natividad Brood, RN BSN [facility] Liaison  [phone] business mobile phone Toll free office [phone]  Fax number: [phone] [name]

## 2019-07-11 NOTE — Progress Notes (Signed)
Patient Demographics:    Kelly Key, is a 48 y.o. male, DOB - 01-Sep-1971, ZOX:[id number]  Admit date - 07/03/2019   Admitting Physician Karmen Bongo, MD  Outpatient Primary MD for the patient is [name], MD  LOS - 6  HPI 48 y.o. male with medical history significant of obesity (BMI 42); HTN; HLD: DM; stage 4 CKD; and chronic combined CHF (EF 50-55% in 2013) presenting with increased LE edema, decreased ambulation - readmitted on 07/03/2019 with anasarca in the setting of dCHF exacerbation- -transfered to [facility] for tunneled HD catheter placement and initiation of hemodialysis.  Patient admitted as above with worsening kidney function in the setting of heart failure exacerbation and volume overload.  Patient now tolerating dialysis quite well, continues to diurese somewhat well but in general clinically improving.  Currently awaiting further evaluation with nephrology for ongoing dialysis and outpatient dialysis slot.  Chief Complaint  Patient presents with   Leg Swelling        Subjective:   No acute issues or events overnight, patient continues to complain of general malaise but otherwise denies overt chest pain, shortness of breath, nausea, vomiting, diarrhea, constipation.  Patient states his ambulation is still somewhat limited but markedly improving from previous.  Edema also improving.   Assessment  & Plan :    Principal Problem:   Anasarca Active Problems:   HTN (hypertension)   Morbid obesity (HCC)   CKD (chronic kidney disease) stage 5, GFR less than 15 ml/min (HCC)   Uncontrolled type 2 diabetes mellitus with hyperglycemia, with long-term current use of insulin (HCC)   Dyslipidemia   Chronic combined systolic and diastolic CHF (congestive heart failure) (HCC)   Volume overload   Congestive heart failure (HCC)   Anasarca, likely multifactorial - in the setting of  HFpEF/worsening kidney function as below, POA, improving --last known EF per echo from 06/23/2019 is 50 to 55%, -patient with history of chronic low EF CHF--- presenting with acute on chronic preserved EF CHF exacerbation, -PTA patient was on torsemide 60 mg twice daily -Continue IV Lasix 80 mg twice daily  -urine output downtrending in the setting of ongoing dialysis -net -13 L since admission -Status post right IJ tunneled hemodialysis catheter with IR on 07/08/2019 -Nephrology following for dialysis and assistance with management of his volume status; mild hypocalcemia, hyperphosphatemia -likely to be reactive with dialysis  CKD 4- progressively worsening -now ESRD on dialysis -Prior renal ultrasound without obstructive uropathy -Nephrology following, appreciate insight recommendations, tolerated dialysis quite well -Patient continues to make moderate amount of urine -although downtrending with dialysis -Patient has been clipped to Tuesday Thursday Saturday dialysis slot per coordinator  HTN, essential -Workup for possible renal artery stenosis including MRI of renal arteries -  Dr Oneida Alar has scheduled this on 07/15/19 -Stable, continue Coreg 25 mg twice daily, clonidine 0.3 mg TID, amlodipine 10 daily  Chronic anemia of chronic diease 2/2 CKD superimposed on iron deficiency anemia -Hemoglobin 9.7 and stable - attempt to minimize blood draws -Defer decision on EPO agent to nephrology service -Serum iron is 11, iron saturation 4 - s/p fereheme 9/10  Insulin dependent DM2, uncontrolled -last A1c 12.4, reflecting uncontrolled diabetes -Resume levemir - currently 35 unit twice daily -moderately well controlled  currently -Continue sliding scale insulin, hypoglycemic protocol  Intermittent chest discomfort/dyspnea on exertion, likely in the setting of poor volume control/ascites, resolved -Questionably effusion vs musculoskeletal in nature -Not a candidate for CTA chest due to renal function,  chronic lower extremity edema is most likely due to renal and heart failure -patient had negative venous Dopplers of the lower extremity on 02/21/2019, also had repeat negative lower extremity Dopplers on 04/28/2019, lower extremity venous Dopplers were again negative on 8/26/202- -continue DVT prophylaxis, low clinical index of suspicion for PE at this time  Code Status : FULL Disposition Plan:Remain inpatient -previously requiring tunneled HD catheter placement and initiation of HD - disposition pending clinical improvement and need for dialysis slot Consults  :  Nephrology/general surgery/IR Procedures- - tunneled HD catheter placement 07/08/2019 DVT Prophylaxis  :  Heparin  Lab Results  Component Value Date   PLT 481 (H) 07/10/2019    Inpatient Medications  Scheduled Meds:  allopurinol  200 mg Oral Daily   amLODipine  10 mg Oral Daily   aspirin EC  81 mg Oral Daily   atorvastatin  80 mg Oral Daily   carvedilol  25 mg Oral BID WC   Chlorhexidine Gluconate Cloth  6 each Topical Q0600   cloNIDine  0.3 mg Oral TID   ezetimibe  10 mg Oral QPM   fluticasone  1 spray Each Nare Daily   furosemide  80 mg Intravenous BID   gabapentin  100 mg Oral TID   heparin  5,000 Units Subcutaneous Q8H   insulin aspart  0-20 Units Subcutaneous TID WC   insulin aspart  0-5 Units Subcutaneous QHS   insulin detemir  35 Units Subcutaneous BID   linaclotide  145 mcg Oral QAC breakfast   potassium chloride  20 mEq Oral Once   sevelamer carbonate  800 mg Oral TID WC   sodium chloride flush  3 mL Intravenous Q12H   traMADol  50 mg Oral Once   Continuous Infusions:  sodium chloride     sodium chloride     sodium chloride     PRN Meds:.sodium chloride, sodium chloride, sodium chloride, acetaminophen, albuterol, alteplase, calcium carbonate (dosed in mg elemental calcium), camphor-menthol **AND** hydrOXYzine, docusate sodium, feeding supplement (NEPRO CARB STEADY), heparin, heparin,  HYDROcodone-acetaminophen, ondansetron (ZOFRAN) IV, sodium chloride flush, sorbitol    Anti-infectives (From admission, onward)   Start     Dose/Rate Route Frequency Ordered Stop   07/08/19 1227  ceFAZolin (ANCEF) 2-4 GM/100ML-% IVPB    Note to Pharmacy: Manuela Neptune   : cabinet override      07/08/19 1227 07/08/19 1225   07/06/19 1045  ceFAZolin (ANCEF) 3 g in dextrose 5 % 50 mL IVPB  Status:  Discontinued     3 g 100 mL/hr over 30 Minutes Intravenous On call to O.R. 07/06/19 1037 07/06/19 1231        Objective:   Vitals:   07/10/19 1046 07/10/19 1725 07/10/19 2035 07/11/19 0440  BP: (!) 181/99 112/83 (!) 154/93 (!) 156/97  Pulse: 84 91 77 86  Resp: 18 18 18 18   Temp: 98.1 F (36.7 C)  97.8 F (36.6 C) 97.7 F (36.5 C)  TempSrc: Oral  Oral Oral  SpO2: 97% 95% 96% 98%  Weight:      Height:        Wt Readings from Last 3 Encounters:  07/10/19 135.5 kg  06/27/19 (!) 141.6 kg  06/16/19 (!) 138.8 kg     Intake/Output Summary (Last 24  hours) at 07/11/2019 0728 Last data filed at 07/11/2019 0615 Gross per 24 hour  Intake 600 ml  Output 4250 ml  Net -3650 ml     Physical Exam Gen:- Awake Alert, resting comfortably in bed, appears nontoxic HEENT:- [place].AT, No sclera icterus Neck-Supple Neck,No JVD,.  Lungs-scant bibasilar rales without overt rhonchi or wheeze, no accessory muscle use CV- S1, S2 normal, regular ,  Abd-  +ve B.Sounds, Abd Soft, increased truncal adiposity  Extremities-1-2+ pitting edema distally in lower extremities bilaterally Psych-affect is appropriate, oriented x3 Neuro-generalized weakness, no new focal deficits, no tremors   Data Review:   Micro Results Recent Results (from the past 240 hour(s))  SARS Coronavirus 2 [facility] order, Performed in [facility] hospital lab) Nasopharyngeal Nasopharyngeal Swab     Status: None   Collection Time: 07/03/19  4:08 PM   Specimen: Nasopharyngeal Swab  Result Value Ref Range Status   SARS Coronavirus  2 NEGATIVE NEGATIVE Final    Comment: (NOTE) If result is NEGATIVE SARS-CoV-2 target nucleic acids are NOT DETECTED. The SARS-CoV-2 RNA is generally detectable in upper and lower  respiratory specimens during the acute phase of infection. The lowest  concentration of SARS-CoV-2 viral copies this assay can detect is 250  copies / mL. A negative result does not preclude SARS-CoV-2 infection  and should not be used as the sole basis for treatment or other  patient management decisions.  A negative result may occur with  improper specimen collection / handling, submission of specimen other  than nasopharyngeal swab, presence of viral mutation(s) within the  areas targeted by this assay, and inadequate number of viral copies  (<250 copies / mL). A negative result must be combined with clinical  observations, patient history, and epidemiological information. If result is POSITIVE SARS-CoV-2 target nucleic acids are DETECTED. The SARS-CoV-2 RNA is generally detectable in upper and lower  respiratory specimens dur ing the acute phase of infection.  Positive  results are indicative of active infection with SARS-CoV-2.  Clinical  correlation with patient history and other diagnostic information is  necessary to determine patient infection status.  Positive results do  not rule out bacterial infection or co-infection with other viruses. If result is PRESUMPTIVE POSTIVE SARS-CoV-2 nucleic acids MAY BE PRESENT.   A presumptive positive result was obtained on the submitted specimen  and confirmed on repeat testing.  While 2019 novel coronavirus  (SARS-CoV-2) nucleic acids may be present in the submitted sample  additional confirmatory testing may be necessary for epidemiological  and / or clinical management purposes  to differentiate between  SARS-CoV-2 and other Sarbecovirus currently known to infect humans.  If clinically indicated additional testing with an alternate test  methodology  [phone]) is advised. The SARS-CoV-2 RNA is generally  detectable in upper and lower respiratory sp ecimens during the acute  phase of infection. The expected result is Negative. Fact Sheet for Patients:  StrictlyIdeas.no Fact Sheet for Healthcare Providers: BankingDealers.co.za This test is not yet approved or cleared by the [place] FDA and has been authorized for detection and/or diagnosis of SARS-CoV-2 by FDA under an Emergency Use Authorization (EUA).  This EUA will remain in effect (meaning this test can be used) for the duration of the COVID-19 declaration under Section 564(b)(1) of the Act, 21 U.S.C. section 360bbb-3(b)(1), unless the authorization is terminated or revoked sooner. Performed at [facility], [address], [place], [place] [address]   Surgical PCR screen     Status: Abnormal   Collection  Time: 07/06/19 12:57 AM   Specimen: Nasal Mucosa; Nasal Swab  Result Value Ref Range Status   MRSA, PCR POSITIVE (A) NEGATIVE Final   Staphylococcus aureus POSITIVE (A) NEGATIVE Final    Comment: RESULT CALLED TO, READ BACK BY AND VERIFIED WITH: J RHUE,RN @0419  07/06/19 MKELLY (NOTE) The Xpert SA Assay (FDA approved for NASAL specimens in patients 89 years of age and older), is one component of a comprehensive surveillance program. It is not intended to diagnose infection nor to guide or monitor treatment. Performed at [facility], [address] [place], [place] [address]     Radiology Reports Dg Chest 2 View  Result Date: 07/03/2019 CLINICAL DATA:  History of end-stage renal disease and CHF. EXAM: CHEST - 2 VIEW COMPARISON:  Chest radiograph 06/22/2019, 04/28/2019 FINDINGS: Stable cardiomediastinal contours with enlarged heart size. A few minimal linear opacities at the right lung base likely reflect atelectasis or scar. No new focal infiltrate or evidence of edema. No pneumothorax or pleural effusion. No acute findings  in the visualized skeleton. IMPRESSION: No evidence of active disease. Electronically Signed   By: Audie Pinto M.D.   On: 07/03/2019 13:57   US Renal  Result Date: 06/23/2019 CLINICAL DATA:  Acute renal failure EXAM: RENAL / URINARY TRACT ULTRASOUND COMPLETE COMPARISON:  None. FINDINGS: Right Kidney: Renal measurements: 11.7 x 4.8 x 6.1 cm = volume: 177 mL . Echogenicity within normal limits. No mass or hydronephrosis visualized. Left Kidney: Renal measurements: 11.1 x 5.3 x 5.9 cm = volume: 181 mL. Echogenicity within normal limits. No mass or hydronephrosis visualized. Bladder: Appears normal for degree of bladder distention. IMPRESSION: Normal bilateral renal ultrasound. Electronically Signed   By: Kathreen Devoid   On: 06/23/2019 11:46   US Venous Img Lower Bilateral  Result Date: 06/22/2019 CLINICAL DATA:  Bilateral lower extremity pain and edema. Evaluate for DVT. EXAM: BILATERAL LOWER EXTREMITY VENOUS DOPPLER ULTRASOUND TECHNIQUE: Gray-scale sonography with graded compression, as well as color Doppler and duplex ultrasound were performed to evaluate the lower extremity deep venous systems from the level of the common femoral vein and including the common femoral, femoral, profunda femoral, popliteal and calf veins including the posterior tibial, peroneal and gastrocnemius veins when visible. The superficial great saphenous vein was also interrogated. Spectral Doppler was utilized to evaluate flow at rest and with distal augmentation maneuvers in the common femoral, femoral and popliteal veins. COMPARISON:  None. FINDINGS: RIGHT LOWER EXTREMITY Common Femoral Vein: No evidence of thrombus. Normal compressibility, respiratory phasicity and response to augmentation. Saphenofemoral Junction: No evidence of thrombus. Normal compressibility and flow on color Doppler imaging. Profunda Femoral Vein: No evidence of thrombus. Normal compressibility and flow on color Doppler imaging. Femoral Vein: No evidence  of thrombus. Normal compressibility, respiratory phasicity and response to augmentation. Popliteal Vein: No evidence of thrombus. Normal compressibility, respiratory phasicity and response to augmentation. Calf Veins: No evidence of thrombus. Normal compressibility and flow on color Doppler imaging. Superficial Great Saphenous Vein: No evidence of thrombus. Normal compressibility. Venous Reflux:  None. Other Findings:  None. LEFT LOWER EXTREMITY Common Femoral Vein: No evidence of thrombus. Normal compressibility, respiratory phasicity and response to augmentation. Saphenofemoral Junction: No evidence of thrombus. Normal compressibility and flow on color Doppler imaging. Profunda Femoral Vein: No evidence of thrombus. Normal compressibility and flow on color Doppler imaging. Femoral Vein: No evidence of thrombus. Normal compressibility, respiratory phasicity and response to augmentation. Popliteal Vein: No evidence of thrombus. Normal compressibility, respiratory phasicity and response to augmentation. Calf  Veins: No evidence of thrombus. Normal compressibility and flow on color Doppler imaging. Superficial Great Saphenous Vein: No evidence of thrombus. Normal compressibility. Venous Reflux:  None. Other Findings:  None. IMPRESSION: No evidence of DVT within either lower extremity. Electronically Signed   By: Sandi Mariscal M.D.   On: 06/22/2019 14:52   Ir Fluoro Guide Cv Line Right  Result Date: 07/08/2019 INDICATION: 48 year old male with a history of renal failure EXAM: TUNNELED CENTRAL VENOUS HEMODIALYSIS CATHETER PLACEMENT WITH ULTRASOUND AND FLUOROSCOPIC GUIDANCE MEDICATIONS: 2 g Ancef. The antibiotic was given in an appropriate time interval prior to skin puncture. ANESTHESIA/SEDATION: Moderate (conscious) sedation was employed during this procedure. A total of Versed 2 mg and Fentanyl 100 mcg was administered intravenously. Moderate Sedation Time: 15 minutes. The patient's level of consciousness and vital  signs were monitored continuously by radiology nursing throughout the procedure under my direct supervision. FLUOROSCOPY TIME:  Fluoroscopy Time: 0 minutes 48 seconds (7 mGy). COMPLICATIONS: None PROCEDURE: Informed written consent was obtained from the patient after a discussion of the risks, benefits, and alternatives to treatment. Questions regarding the procedure were encouraged and answered. The right neck and chest were prepped with chlorhexidine in a sterile fashion, and a sterile drape was applied covering the operative field. Maximum barrier sterile technique with sterile gowns and gloves were used for the procedure. A timeout was performed prior to the initiation of the procedure. After creating a small venotomy incision, a micropuncture kit was utilized to access the right internal jugular vein under direct, real-time ultrasound guidance after the overlying soft tissues were anesthetized with 1% lidocaine with epinephrine. Ultrasound image documentation was performed. The microwire was marked to measure appropriate internal catheter length. External tunneled length was estimated. A total tip to cuff length of 23 cm was selected. Skin and subcutaneous tissues of chest wall below the clavicle were generously infiltrated with 1% lidocaine for local anesthesia. A small stab incision was made with 11 blade scalpel. The selected hemodialysis catheter was tunneled in a retrograde fashion from the anterior chest wall to the venotomy incision. A guidewire was advanced to the level of the IVC and the micropuncture sheath was exchanged for a peel-away sheath. The catheter was then placed through the peel-away sheath with tips ultimately positioned within the superior aspect of the right atrium. Final catheter positioning was confirmed and documented with a spot radiographic image. The catheter aspirates and flushes normally. The catheter was flushed with appropriate volume heparin dwells. The catheter exit site was  secured with a 0-Prolene retention suture. The venotomy incision was closed Derma bond and sterile dressing. Dressings were applied at the chest wall. Patient tolerated the procedure well and remained hemodynamically stable throughout. No complications were encountered and no significant blood loss encountered. IMPRESSION: Status post right IJ tunneled hemodialysis catheter. Catheter ready for use. Signed, Dulcy Fanny. Dellia Nims, RPVI [facility] Radiology Electronically Signed   By: Corrie Mckusick D.O.   On: 07/08/2019 13:24   Ir US Guide Vasc Access Right  Result Date: 07/08/2019 INDICATION: 48 year old male with a history of renal failure EXAM: TUNNELED CENTRAL VENOUS HEMODIALYSIS CATHETER PLACEMENT WITH ULTRASOUND AND FLUOROSCOPIC GUIDANCE MEDICATIONS: 2 g Ancef. The antibiotic was given in an appropriate time interval prior to skin puncture. ANESTHESIA/SEDATION: Moderate (conscious) sedation was employed during this procedure. A total of Versed 2 mg and Fentanyl 100 mcg was administered intravenously. Moderate Sedation Time: 15 minutes. The patient's level of consciousness and vital signs were monitored continuously  by radiology nursing throughout the procedure under my direct supervision. FLUOROSCOPY TIME:  Fluoroscopy Time: 0 minutes 48 seconds (7 mGy). COMPLICATIONS: None PROCEDURE: Informed written consent was obtained from the patient after a discussion of the risks, benefits, and alternatives to treatment. Questions regarding the procedure were encouraged and answered. The right neck and chest were prepped with chlorhexidine in a sterile fashion, and a sterile drape was applied covering the operative field. Maximum barrier sterile technique with sterile gowns and gloves were used for the procedure. A timeout was performed prior to the initiation of the procedure. After creating a small venotomy incision, a micropuncture kit was utilized to access the right  internal jugular vein under direct, real-time ultrasound guidance after the overlying soft tissues were anesthetized with 1% lidocaine with epinephrine. Ultrasound image documentation was performed. The microwire was marked to measure appropriate internal catheter length. External tunneled length was estimated. A total tip to cuff length of 23 cm was selected. Skin and subcutaneous tissues of chest wall below the clavicle were generously infiltrated with 1% lidocaine for local anesthesia. A small stab incision was made with 11 blade scalpel. The selected hemodialysis catheter was tunneled in a retrograde fashion from the anterior chest wall to the venotomy incision. A guidewire was advanced to the level of the IVC and the micropuncture sheath was exchanged for a peel-away sheath. The catheter was then placed through the peel-away sheath with tips ultimately positioned within the superior aspect of the right atrium. Final catheter positioning was confirmed and documented with a spot radiographic image. The catheter aspirates and flushes normally. The catheter was flushed with appropriate volume heparin dwells. The catheter exit site was secured with a 0-Prolene retention suture. The venotomy incision was closed Derma bond and sterile dressing. Dressings were applied at the chest wall. Patient tolerated the procedure well and remained hemodynamically stable throughout. No complications were encountered and no significant blood loss encountered. IMPRESSION: Status post right IJ tunneled hemodialysis catheter. Catheter ready for use. Signed, Dulcy Fanny. Dellia Nims, RPVI [facility] Radiology Electronically Signed   By: Corrie Mckusick D.O.   On: 07/08/2019 13:24   Dg Chest Port 1 View  Result Date: 06/22/2019 CLINICAL DATA:  Pt c/o swelling in lower extremities x several days. Hx CHF, obesity, diabetes, nonsmoker EXAM: PORTABLE CHEST 1 VIEW COMPARISON:  Chest radiographs  dated 04/28/2019, 02/23/2019 FINDINGS: Stable cardiomediastinal contours with enlarged heart size. Central vascular congestion without overt edema. No pneumothorax or pleural effusion. Visualized skeleton is unremarkable. IMPRESSION: Cardiomegaly and central vascular congestion without overt edema. Electronically Signed   By: Audie Pinto M.D.   On: 06/22/2019 10:53   Dg Hand Complete Left  Result Date: 07/07/2019 CLINICAL DATA:  Pain and swelling, first and second metacarpals. EXAM: LEFT HAND - COMPLETE 3+ VIEW COMPARISON:  None. FINDINGS: No fracture or dislocation of the left hand. Joint spaces are well preserved. Diffuse soft tissue edema about the hand. IMPRESSION: No fracture or dislocation of the left hand. Joint spaces are well preserved. Diffuse soft tissue edema about the hand. Electronically Signed   By: Eddie Candle M.D.   On: 07/07/2019 21:18   Vas US Renal Artery Duplex  Result Date: 06/16/2019 ABDOMINAL VISCERAL High Risk Factors: Hypertension, hyperlipidemia, Diabetes, no history of                    smoking. Other Factors: Limited study secondary to obesity and bowel gas. Limitations: Air/bowel gas and obesity. Performing  Technologist: Delorise Shiner RVT  Examination Guidelines: A complete evaluation includes B-mode imaging, spectral Doppler, color Doppler, and power Doppler as needed of all accessible portions of each vessel. Bilateral testing is considered an integral part of a complete examination. Limited examinations for reoccurring indications may be performed as noted.  Duplex Findings: +----------+--------+--------+------+--------+  Mesenteric PSV cm/s EDV cm/s Plaque Comments  +----------+--------+--------+------+--------+  Aorta Mid     58                              +----------+--------+--------+------+--------+  +------------------+--------+--------+-------+  Right Renal Artery PSV cm/s EDV cm/s Comment  +------------------+--------+--------+-------+  Proximal               25                      +------------------+--------+--------+-------+  Mid                   26                      +------------------+--------+--------+-------+  Distal                27                      +------------------+--------+--------+-------+ +-----------------+--------+--------+-------+  Left Renal Artery PSV cm/s EDV cm/s Comment  +-----------------+--------+--------+-------+  Origin               40                      +-----------------+--------+--------+-------+  Proximal             29                      +-----------------+--------+--------+-------+  Mid                  27                      +-----------------+--------+--------+-------+  Distal               23                      +-----------------+--------+--------+-------+ +------------+--------+--------+--+-----------+--------+--------+---+  Right Kidney PSV cm/s EDV cm/s RI Left Kidney PSV cm/s EDV cm/s RI   +------------+--------+--------+--+-----------+--------+--------+---+  Upper Pole                        Upper Pole                         +------------+--------+--------+--+-----------+--------+--------+---+  Mid                               Mid                                +------------+--------+--------+--+-----------+--------+--------+---+  Lower Pole                        Lower Pole                         +------------+--------+--------+--+-----------+--------+--------+---+  Hilar  Hilar                              +------------+--------+--------+--+-----------+--------+--------+---+ +------------------+-------+------------------+-------+  Right Kidney               Left Kidney                 +------------------+-------+------------------+-------+  RAR                        RAR                         +------------------+-------+------------------+-------+  RAR (manual)               RAR (manual)                +------------------+-------+------------------+-------+  Cortex                      Cortex                      +------------------+-------+------------------+-------+  Cortex thickness   1.88 mm Corex thickness    1.84 mm  +------------------+-------+------------------+-------+  Kidney length (cm) 13.27   Kidney length (cm) 10.24    +------------------+-------+------------------+-------+  Summary: Renal:  Right: Normal size right kidney. No evidence of right renal artery        stenosis. RRV flow present. Limited study secondary to body        habitus. Could not insonnate right renal artery origin. Left:  Normal size of left kidney. No evidence of left renal artery        stenosis. LRV flow present. Limited study secondary to body        habitus.  *See table(s) above for measurements and observations.  Diagnosing physician: Ruta Hinds MD  Electronically signed by Ruta Hinds MD on 06/16/2019 at 10:43:24 AM.    Final      CBC Recent Labs  Lab 07/06/19 0502 07/07/19 0500 07/08/19 0955 07/09/19 0536 07/10/19 0412  WBC 9.0 8.8 8.1 8.1 8.3  HGB 9.5* 9.8* 10.4* 9.7* 9.7*  HCT 30.9* 30.8* 32.9* 30.7* 30.2*  PLT 352 398 443* 429* 481*  MCV 78.0* 77.8* 76.2* 76.2* 76.1*  MCH 24.0* 24.7* 24.1* 24.1* 24.4*  MCHC 30.7 31.8 31.6 31.6 32.1  RDW 15.1 15.5 15.3 15.2 15.4    Chemistries  Recent Labs  Lab 07/05/19 0459  07/07/19 0500 07/08/19 0955 07/09/19 0536 07/10/19 0412 07/11/19 0508  NA 134*   < > 132* 133* 134* 135 133*  K 3.2*   < > 3.5 3.6 3.7 3.5 3.4*  CL 97*   < > 96* 95* 95* 93* 93*  CO2 22   < > 21* 20* 22 24 24   GLUCOSE 114*   < > 197* 198* 202* 180* 166*  BUN 75*   < > 88* 99* 84* 95* 71*  CREATININE 5.84*   < > 6.56* 6.99* 6.25* 6.46* 5.96*  CALCIUM 7.9*   < > 8.1* 9.0 8.5* 8.5* 8.7*  MG 2.1  --   --   --   --   --   --    < > = values in this interval not displayed.   --------------------------------------------------------------------------------------------------  Lab Results  Component Value Date   HGBA1C 12.1 (H) 06/23/2019    -------------------------------------------------------------------------------------------------------------------------------------------------------------------------------------------------------------------- Recent Labs    07/09/19 1312  FERRITIN 449*  TIBC 260  IRON 125    Coagulation profile No results for input(s): INR, PROTIME in the last 168 hours. ------------------------------------------------------------------------------------------------------------------    Component Value Date/Time   BNP 119.0 (H) 06/22/2019 [place] DO on 07/11/2019 at 7:28 AM  Go to [email or web address] - for contact info  [facility] - Office  [phone]

## 2019-07-11 NOTE — Progress Notes (Signed)
Rehab Admissions Coordinator Note:  Per PT recommendation, this patient was screened by Jhonnie Garner for appropriateness for an Inpatient Acute Rehab Consult.  At this time, it does not appear the pt has a diagnosis amendable to CIR. Pt is not a candidate for IP Rehab.   Jhonnie Garner 07/11/2019, 6:56 PM  I can be reached at [phone].

## 2019-07-12 ENCOUNTER — Other Ambulatory Visit: Payer: Self-pay | Admitting: *Deleted

## 2019-07-12 LAB — CBC
HCT: 31.5 % — ABNORMAL LOW (ref 39.0–52.0)
Hemoglobin: 10.1 g/dL — ABNORMAL LOW (ref 13.0–17.0)
MCH: 24.8 pg — ABNORMAL LOW (ref 26.0–34.0)
MCHC: 32.1 g/dL (ref 30.0–36.0)
MCV: 77.4 fL — ABNORMAL LOW (ref 80.0–100.0)
Platelets: 489 10*3/uL — ABNORMAL HIGH (ref 150–400)
RBC: 4.07 MIL/uL — ABNORMAL LOW (ref 4.22–5.81)
RDW: 15.4 % (ref 11.5–15.5)
WBC: 8.8 10*3/uL (ref 4.0–10.5)
nRBC: 0 % (ref 0.0–0.2)

## 2019-07-12 LAB — RENAL FUNCTION PANEL
Albumin: 2.2 g/dL — ABNORMAL LOW (ref 3.5–5.0)
Anion gap: 19 — ABNORMAL HIGH (ref 5–15)
BUN: 92 mg/dL — ABNORMAL HIGH (ref 6–20)
CO2: 23 mmol/L (ref 22–32)
Calcium: 8.9 mg/dL (ref 8.9–10.3)
Chloride: 92 mmol/L — ABNORMAL LOW (ref 98–111)
Creatinine, Ser: 6.64 mg/dL — ABNORMAL HIGH (ref 0.61–1.24)
GFR calc Af Amer: 10 mL/min — ABNORMAL LOW (ref >60)
GFR calc non Af Amer: 9 mL/min — ABNORMAL LOW (ref >60)
Glucose, Bld: 170 mg/dL — ABNORMAL HIGH (ref 70–99)
Phosphorus: 8.7 mg/dL — ABNORMAL HIGH (ref 2.5–4.6)
Potassium: 3.7 mmol/L (ref 3.5–5.1)
Sodium: 134 mmol/L — ABNORMAL LOW (ref 135–145)

## 2019-07-12 LAB — GLUCOSE, CAPILLARY
Glucose-Capillary: 137 mg/dL — ABNORMAL HIGH (ref 70–99)
Glucose-Capillary: 206 mg/dL — ABNORMAL HIGH (ref 70–99)
Glucose-Capillary: 210 mg/dL — ABNORMAL HIGH (ref 70–99)

## 2019-07-12 MED ORDER — HEPARIN SODIUM (PORCINE) 1000 UNIT/ML IJ SOLN
INTRAMUSCULAR | Status: AC
  Filled 2019-07-12: qty 4

## 2019-07-12 NOTE — Progress Notes (Addendum)
Physical Therapy Treatment Patient Details Name: Kelly Key MRN: [id number] DOB: 19-Mar-1971 Today's Date: 07/12/2019    History of Present Illness Kelly Key is a 48 y.o. male with medical history significant of obesity (BMI 49); HTN; HLD: DM; stage 4 CKD; and chronic combined CHF (EF 50-55% in 2013) presenting with increased LE edema, decreased ability to ambulate.  He felt better when he left the hospital.  During the week, his legs got weak and he fell once.  He started developing worsening LE edema.  It has been particularly bad for the last 3 days.  He denies SOB.  His weight has "probably" changed.  He can't lie flat because his feet hurt too much and feel like they weigh too much.  No PND.  No fevers.  He does not want to go to rehab.    PT Comments    Continuing work on functional mobility and activity tolerance;  Seen in conjunction with OT to focus in on mobility and ADL status that he needs to be in order to dc home; Seen after HD, and he was particularly fatigued this session; Dependent on momentum/weight shift to stand, and requried mod assist for safe descent to sit; Walked 10 feet with heavy dependence on RW for support; Lengthy discussion with Mr. Bauernfeind and OT re: dc recommendations of post-acute rehab to maximize independence and safety with mobility prior to dc home.  BP was 100/70 seated in recliner after activity; See vitals flow sheet.; Will plan to perform orthostatics next session    Follow Up Recommendations  CIR;Other (comment)(post acute rehab; If he continues to refuse, consider [place] for a more rehab focused [facility] services)     Equipment Recommendations  Rolling walker with 5" wheels;3in1 (PT);Other (comment)(may consider Bari Rollator -- at this point, a rollator may be too much freedom of movement and not stable enough for Mr. Vitalis; alos must consider arranging for medical Lucianne Lei transport for HD)    Recommendations for Other Services        Precautions / Restrictions Precautions Precautions: Fall Restrictions Weight Bearing Restrictions: No    Mobility  Bed Mobility Overal bed mobility: Needs Assistance Bed Mobility: Supine to Sit     Supine to sit: Min guard     General bed mobility comments: increased time, labored movement  Transfers Overall transfer level: Needs assistance Equipment used: Rolling walker (2 wheeled) Transfers: Sit to/from Stand Sit to Stand: Mod assist;Min assist;+2 physical assistance Stand pivot transfers: Min assist;+2 physical assistance       General transfer comment: Cues for hand placement and safety; noted dependent on weight shift/momentum and then pushes up with UEs on RW to acheive fully upright standing. Cues required multiple times for standing upright and correct positional use of RW and speed of descent during stand - sit transition  Ambulation/Gait Ambulation/Gait assistance: Min assist Gait Distance (Feet): 10 Feet Assistive device: Rolling walker (2 wheeled) Gait Pattern/deviations: Decreased step length - right;Decreased step length - left;Trunk flexed     General Gait Details: Cues to self-monitor for activiity tolerance; Very weak LEs with heavy dependence on RW for support; Very close guard with hands on assist due to fall risk   Stairs             Wheelchair Mobility    Modified Rankin (Stroke Patients Only)       Balance Overall balance assessment: Needs assistance Sitting-balance support: Feet supported;No upper extremity supported Sitting balance-Leahy Scale: [place] Sitting  balance - Comments: seated at bedside   Standing balance support: During functional activity;Bilateral upper extremity supported Standing balance-Leahy Scale: Poor Standing balance comment: using RW                            Cognition Arousal/Alertness: Awake/alert Behavior During Therapy: WFL for tasks assessed/performed Overall Cognitive Status: Within  Functional Limits for tasks assessed                                        Exercises      General Comments        Pertinent Vitals/Pain Pain Assessment: Faces Faces Pain Scale: Hurts little more Pain Location: RUE at HD access site Pain Descriptors / Indicators: Aching;Sore Pain Intervention(s): Monitored during session    Home Living Family/patient expects to be discharged to:: Private residence Living Arrangements: Alone Available Help at Discharge: Friend(s) Type of Home: House Home Access: Level entry   Home Layout: One level Home Equipment: Environmental consultant - 2 wheels      Prior Function Level of Independence: Independent with assistive device(s)          PT Goals (current goals can now be found in the care plan section) Acute Rehab PT Goals Patient Stated Goal: return home PT Goal Formulation: With patient Time For Goal Achievement: 07/22/19 Potential to Achieve Goals: Good Progress towards PT goals: Progressing toward goals(Exhausted post HD)    Frequency    Min 3X/week      PT Plan Current plan remains appropriate    Co-evaluation PT/OT/SLP Co-Evaluation/Treatment: Yes Reason for Co-Treatment: For patient/therapist safety;To address functional/ADL transfers PT goals addressed during session: Mobility/safety with mobility OT goals addressed during session: ADL's and self-care      AM-PAC PT "6 Clicks" Mobility   Outcome Measure  Help needed turning from your back to your side while in a flat bed without using bedrails?: None Help needed moving from lying on your back to sitting on the side of a flat bed without using bedrails?: A Little Help needed moving to and from a bed to a chair (including a wheelchair)?: A Little Help needed standing up from a chair using your arms (e.g., wheelchair or bedside chair)?: A Little Help needed to walk in hospital room?: A Lot Help needed climbing 3-5 steps with a railing? : Total 6 Click Score:  16    End of Session Equipment Utilized During Treatment: Gait belt Activity Tolerance: Patient limited by fatigue Patient left: in chair;with call bell/phone within reach Nurse Communication: Mobility status PT Visit Diagnosis: Unsteadiness on feet (R26.81);Other abnormalities of gait and mobility (R26.89);Muscle weakness (generalized) (M62.81);History of falling (Z91.81)     Time: [id number] PT Time Calculation (min) (ACUTE ONLY): 33 min  Charges:  $Gait Training: 8-22 mins                     Roney Marion, [place]  Acute Rehabilitation Services Pager [phone] Office [place] 07/12/2019, 4:12 PM

## 2019-07-12 NOTE — Progress Notes (Signed)
Nutrition Education Note  RD consulted for Renal Education. Provided Renal pyramid at bedside. Reviewed food groups and provided written recommended serving sizes specifically determined for patient's current nutritional status.   Explained why diet restrictions are needed and provided lists of foods to limit/avoid that are high potassium, sodium, and phosphorus. Provided specific recommendations on safer alternatives of these foods. Strongly encouraged compliance of this diet.   Discussed importance of protein intake at each meal and snack. Provided examples of how to maximize protein intake throughout the day. Discussed need for fluid restriction with dialysis, importance of minimizing weight gain between HD treatments, and renal-friendly beverage options.  Encouraged pt to discuss specific diet questions/concerns with RD at HD outpatient facility. Teach back method used.  Pt reports he watched sodium intake prior to hospitalization and feels confident in continuing. Went over which foods contain high amounts of phosphorus and potassium. Pt had many specific food questions. RD answered them all. Discussed the importance of protein intake for preservation of lean body mass while on HD. Gave high protein food examples that follow dietary guidelines.   Expect fair compliance.  Body mass index is 45.75 kg/m. Pt meets criteria for obese based on current BMI.  Current diet order is renal/carb modified, patient is consuming approximately 100% of meals at this time. Labs and medications reviewed. No further nutrition interventions warranted at this time. RD contact information provided. If additional nutrition issues arise, please re-consult RD.  Mariana Single RD, LDN Clinical Nutrition Pager # [phone]

## 2019-07-12 NOTE — TOC Progression Note (Addendum)
Transition of Care [facility]) - Progression Note    Patient Details  Name: CIRE CAUTHON MRN: [id number] Date of Birth: 12-18-1970  Transition of [facility] CM/SW Contact  Bartholomew Crews, RN Phone Number: [phone] 07/12/2019, 12:53 PM  Clinical Narrative:    Patient found to not be a candidate for CIR. Will transition home with [facility] PT, OT - [facility] is Chief Operating Officer.  Patient is set up for outpatient hemodialysis at [facility] on TTS. DME needs include bariatric rollator and 3N1. Will need DME orders.   Update: Spoke with patient at the bedside. Discussed that patient was not eligible for CIR based on diagnosis. Discussed SNF, but patient refusing. Patient stating that he wants to go home despite limited mobility at this time. Advised that [facility] is arranged for [facility] PT, OT. Patient would benefit from RN, Aide, SW. Patient will need HH PT, OT, RN, Aide, and SW orders with [facility].  Patient verbalized that he has someone who can transport him to/from hemodialysis. Discussed importance of getting to hemodialysis for each treatment.   Discussed medications and pharmacy. Patient prefers to use [facility] in [place].   CM following for transition of care needs.    Expected Discharge Plan: Opp Barriers to Discharge: Continued Medical Work up  Expected Discharge Plan and Services Expected Discharge Plan: [place] arrangements for the past 2 months: Single Family Home Expected Discharge Date: 07/05/19                                     Social Determinants of Health (SDOH) Interventions    Readmission Risk Interventions Readmission Risk Prevention Plan 06/27/2019 06/23/2019 02/21/2019  Transportation Screening - Complete Complete  PCP or Specialist Appt within 3-5 Days Complete - Complete  HRI or [place] - Complete Not Complete  HRI or Home Care Consult comments - - not needed, independent, active with [facility]  Social  Work Consult for [place] Planning/Counseling - Complete Complete  Palliative Care Screening - Not Applicable Not Applicable  Medication Review Press photographer) - Complete Complete  Some recent data might be hidden

## 2019-07-12 NOTE — Progress Notes (Signed)
Patient Demographics:    Kelly Key, is a 48 y.o. male, DOB - May 18, 1971, OZY:[id number]  Admit date - 07/03/2019   Admitting Physician Karmen Bongo, MD  Outpatient Primary MD for the patient is [name], MD  LOS - 7  HPI 48 y.o. male with medical history significant of obesity (BMI 63); HTN; HLD: DM; stage 4 CKD; and chronic combined CHF (EF 50-55% in 2013) presenting with increased LE edema, decreased ambulation - readmitted on 07/03/2019 with anasarca in the setting of dCHF exacerbation- -transfered to [place] for tunneled HD catheter placement and initiation of hemodialysis. Patient admitted as above with worsening kidney function in the setting of heart failure exacerbation and volume overload.  Patient now tolerating dialysis quite well, continues to diurese somewhat well but in general clinically improving.  Currently awaiting further evaluation with nephrology for ongoing dialysis and outpatient dialysis slot.  Chief Complaint  Patient presents with   Leg Swelling        Subjective:   No acute issues or events overnight, evaluated in dialysis, denies overt chest pain, shortness of breath, nausea, vomiting, diarrhea, constipation.  Patient states his ambulation is still somewhat limited-states he walks maybe 10 feet yesterday.   Assessment  & Plan :    Principal Problem:   Anasarca Active Problems:   HTN (hypertension)   Morbid obesity (HCC)   CKD (chronic kidney disease) stage 5, GFR less than 15 ml/min (HCC)   Uncontrolled type 2 diabetes mellitus with hyperglycemia, with long-term current use of insulin (HCC)   Dyslipidemia   Chronic combined systolic and diastolic CHF (congestive heart failure) (HCC)   Volume overload   Congestive heart failure (HCC)   Anasarca, likely multifactorial - in the setting of HFpEF/worsening kidney function as below, POA, improving --last known EF  per echo from 06/23/2019 is 50 to 55%, -patient with history of chronic low EF CHF--- presenting with acute on chronic preserved EF CHF exacerbation, -Previous home medication torsemide 60 mg twice daily -Continue IV Lasix 80 mg twice daily  - urine output downtrending in the setting of ongoing dialysis -net -17 L since admission -Status post right IJ tunneled hemodialysis catheter with IR on 07/08/2019 -Nephrology following for dialysis and assistance with management of his volume status -Mild hypocalcemia, hyperphosphatemia -likely to be managed with dialysis/diet/meds  CKD 4- progressively worsening -now ESRD on dialysis -Prior renal ultrasound without obstructive uropathy -Nephrology following, appreciate insight recommendations, tolerated dialysis quite well -Patient continues to make moderate amount of urine -although downtrending with dialysis -Patient has been clipped to Tuesday Thursday Saturday dialysis slot per coordinator  HTN, essential -Workup for possible renal artery stenosis including MRI of renal arteries -  Dr Oneida Alar has scheduled this on 07/15/19 -Stable, continue Coreg 25 mg twice daily, clonidine 0.3 mg TID, amlodipine 10 daily  Chronic anemia of chronic diease 2/2 CKD superimposed on iron deficiency anemia -Hemoglobin 9.7 and stable - attempt to minimize blood draws -Defer decision on EPO agent to nephrology service -Serum iron is 11, iron saturation 4 - s/p fereheme 9/10  Insulin dependent DM2, uncontrolled -last A1c 12.4, reflecting uncontrolled diabetes -Resume levemir - currently 35 unit twice daily -moderately well controlled currently -Continue sliding scale insulin, hypoglycemic protocol  Intermittent chest  discomfort/dyspnea on exertion, likely in the setting of poor volume control/ascites, resolved -Questionably effusion vs musculoskeletal in nature -Not a candidate for CTA chest due to renal function, chronic lower extremity edema is most likely due to renal  and heart failure -patient had negative venous Dopplers of the lower extremity on 02/21/2019, also had repeat negative lower extremity Dopplers on 04/28/2019, lower extremity venous Dopplers were again negative on 06/22/2019 -continue DVT prophylaxis, low clinical index of suspicion for PE at this time  Code Status : FULL Disposition Plan:Remain inpatient -previously requiring tunneled HD catheter placement and initiation of HD - disposition pending clinical improvement and need for dialysis slot Consults  :  Nephrology/general surgery/IR Procedures- - tunneled HD catheter placement 07/08/2019 DVT Prophylaxis  :  Heparin  Lab Results  Component Value Date   PLT 481 (H) 07/10/2019    Inpatient Medications  Scheduled Meds:  allopurinol  200 mg Oral Daily   amLODipine  10 mg Oral Daily   aspirin EC  81 mg Oral Daily   atorvastatin  80 mg Oral Daily   carvedilol  25 mg Oral BID WC   Chlorhexidine Gluconate Cloth  6 each Topical Q0600   cloNIDine  0.3 mg Oral TID   ezetimibe  10 mg Oral QPM   fluticasone  1 spray Each Nare Daily   furosemide  80 mg Intravenous BID   gabapentin  100 mg Oral TID   heparin  5,000 Units Subcutaneous Q8H   insulin aspart  0-20 Units Subcutaneous TID WC   insulin aspart  0-5 Units Subcutaneous QHS   insulin detemir  35 Units Subcutaneous BID   linaclotide  145 mcg Oral QAC breakfast   potassium chloride  20 mEq Oral Once   sevelamer carbonate  800 mg Oral TID WC   sodium chloride flush  3 mL Intravenous Q12H   traMADol  50 mg Oral Once   Continuous Infusions:  sodium chloride     sodium chloride     sodium chloride     PRN Meds:.sodium chloride, sodium chloride, sodium chloride, acetaminophen, albuterol, calcium carbonate (dosed in mg elemental calcium), camphor-menthol **AND** hydrOXYzine, docusate sodium, feeding supplement (NEPRO CARB STEADY), HYDROcodone-acetaminophen, ondansetron (ZOFRAN) IV, sodium chloride flush,  sorbitol    Anti-infectives (From admission, onward)   Start     Dose/Rate Route Frequency Ordered Stop   07/08/19 1227  ceFAZolin (ANCEF) 2-4 GM/100ML-% IVPB    Note to Pharmacy: Manuela Neptune   : cabinet override      07/08/19 1227 07/08/19 1225   07/06/19 1045  ceFAZolin (ANCEF) 3 g in dextrose 5 % 50 mL IVPB  Status:  Discontinued     3 g 100 mL/hr over 30 Minutes Intravenous On call to O.R. 07/06/19 1037 07/06/19 1231        Objective:   Vitals:   07/11/19 1003 07/11/19 1616 07/11/19 2137 07/12/19 0535  BP: (!) 142/81 133/67 (!) 149/93 (!) 154/83  Pulse: 87 75 83 79  Resp: 18 18 16 18   Temp: 97.7 F (36.5 C) 98.1 F (36.7 C) 98.6 F (37 C) 98.5 F (36.9 C)  TempSrc: Oral Oral Oral Oral  SpO2: 99% 100% 100% 98%  Weight:      Height:        Wt Readings from Last 3 Encounters:  07/10/19 135.5 kg  06/27/19 (!) 141.6 kg  06/16/19 (!) 138.8 kg     Intake/Output Summary (Last 24 hours) at 07/12/2019 0740 Last data filed at 07/12/2019 0600  Gross per 24 hour  Intake 916 ml  Output 1850 ml  Net -934 ml     Physical Exam Gen:- Awake Alert, resting comfortably in bed, appears nontoxic HEENT:- [place].AT, No sclera icterus Neck-Supple Neck,No JVD, R sided tunneled IJ dialysis cath noted Lungs-scant bibasilar rales without overt rhonchi or wheeze, no accessory muscle use CV- S1, S2 normal, regular ,  Abd-  +ve B.Sounds, Abd Soft, increased truncal adiposity  Extremities- 1+ pitting edema distally in lower extremities bilaterally Neuro-generalized weakness, no new focal deficits, no tremors   Data Review:   Micro Results Recent Results (from the past 240 hour(s))  SARS Coronavirus 2 [facility] order, Performed in [facility] hospital lab) Nasopharyngeal Nasopharyngeal Swab     Status: None   Collection Time: 07/03/19  4:08 PM   Specimen: Nasopharyngeal Swab  Result Value Ref Range Status   SARS Coronavirus 2 NEGATIVE NEGATIVE Final    Comment: (NOTE) If result is  NEGATIVE SARS-CoV-2 target nucleic acids are NOT DETECTED. The SARS-CoV-2 RNA is generally detectable in upper and lower  respiratory specimens during the acute phase of infection. The lowest  concentration of SARS-CoV-2 viral copies this assay can detect is 250  copies / mL. A negative result does not preclude SARS-CoV-2 infection  and should not be used as the sole basis for treatment or other  patient management decisions.  A negative result may occur with  improper specimen collection / handling, submission of specimen other  than nasopharyngeal swab, presence of viral mutation(s) within the  areas targeted by this assay, and inadequate number of viral copies  (<250 copies / mL). A negative result must be combined with clinical  observations, patient history, and epidemiological information. If result is POSITIVE SARS-CoV-2 target nucleic acids are DETECTED. The SARS-CoV-2 RNA is generally detectable in upper and lower  respiratory specimens dur ing the acute phase of infection.  Positive  results are indicative of active infection with SARS-CoV-2.  Clinical  correlation with patient history and other diagnostic information is  necessary to determine patient infection status.  Positive results do  not rule out bacterial infection or co-infection with other viruses. If result is PRESUMPTIVE POSTIVE SARS-CoV-2 nucleic acids MAY BE PRESENT.   A presumptive positive result was obtained on the submitted specimen  and confirmed on repeat testing.  While 2019 novel coronavirus  (SARS-CoV-2) nucleic acids may be present in the submitted sample  additional confirmatory testing may be necessary for epidemiological  and / or clinical management purposes  to differentiate between  SARS-CoV-2 and other Sarbecovirus currently known to infect humans.  If clinically indicated additional testing with an alternate test  methodology [phone]) is advised. The SARS-CoV-2 RNA is generally  detectable  in upper and lower respiratory sp ecimens during the acute  phase of infection. The expected result is Negative. Fact Sheet for Patients:  StrictlyIdeas.no Fact Sheet for Healthcare Providers: BankingDealers.co.za This test is not yet approved or cleared by the [place] FDA and has been authorized for detection and/or diagnosis of SARS-CoV-2 by FDA under an Emergency Use Authorization (EUA).  This EUA will remain in effect (meaning this test can be used) for the duration of the COVID-19 declaration under Section 564(b)(1) of the Act, 21 U.S.C. section 360bbb-3(b)(1), unless the authorization is terminated or revoked sooner. Performed at [facility], [address], [place], [place] [address]   Surgical PCR screen     Status: Abnormal   Collection Time: 07/06/19 12:57 AM   Specimen: Nasal  Mucosa; Nasal Swab  Result Value Ref Range Status   MRSA, PCR POSITIVE (A) NEGATIVE Final   Staphylococcus aureus POSITIVE (A) NEGATIVE Final    Comment: RESULT CALLED TO, READ BACK BY AND VERIFIED WITH: J RHUE,RN @0419  07/06/19 MKELLY (NOTE) The Xpert SA Assay (FDA approved for NASAL specimens in patients 10 years of age and older), is one component of a comprehensive surveillance program. It is not intended to diagnose infection nor to guide or monitor treatment. Performed at [facility], [address] [place], [place] [address]     Radiology Reports Dg Chest 2 View  Result Date: 07/03/2019 CLINICAL DATA:  History of end-stage renal disease and CHF. EXAM: CHEST - 2 VIEW COMPARISON:  Chest radiograph 06/22/2019, 04/28/2019 FINDINGS: Stable cardiomediastinal contours with enlarged heart size. A few minimal linear opacities at the right lung base likely reflect atelectasis or scar. No new focal infiltrate or evidence of edema. No pneumothorax or pleural effusion. No acute findings in the visualized skeleton. IMPRESSION: No evidence of active  disease. Electronically Signed   By: Audie Pinto M.D.   On: 07/03/2019 13:57   US Renal  Result Date: 06/23/2019 CLINICAL DATA:  Acute renal failure EXAM: RENAL / URINARY TRACT ULTRASOUND COMPLETE COMPARISON:  None. FINDINGS: Right Kidney: Renal measurements: 11.7 x 4.8 x 6.1 cm = volume: 177 mL . Echogenicity within normal limits. No mass or hydronephrosis visualized. Left Kidney: Renal measurements: 11.1 x 5.3 x 5.9 cm = volume: 181 mL. Echogenicity within normal limits. No mass or hydronephrosis visualized. Bladder: Appears normal for degree of bladder distention. IMPRESSION: Normal bilateral renal ultrasound. Electronically Signed   By: Kathreen Devoid   On: 06/23/2019 11:46   US Venous Img Lower Bilateral  Result Date: 06/22/2019 CLINICAL DATA:  Bilateral lower extremity pain and edema. Evaluate for DVT. EXAM: BILATERAL LOWER EXTREMITY VENOUS DOPPLER ULTRASOUND TECHNIQUE: Gray-scale sonography with graded compression, as well as color Doppler and duplex ultrasound were performed to evaluate the lower extremity deep venous systems from the level of the common femoral vein and including the common femoral, femoral, profunda femoral, popliteal and calf veins including the posterior tibial, peroneal and gastrocnemius veins when visible. The superficial great saphenous vein was also interrogated. Spectral Doppler was utilized to evaluate flow at rest and with distal augmentation maneuvers in the common femoral, femoral and popliteal veins. COMPARISON:  None. FINDINGS: RIGHT LOWER EXTREMITY Common Femoral Vein: No evidence of thrombus. Normal compressibility, respiratory phasicity and response to augmentation. Saphenofemoral Junction: No evidence of thrombus. Normal compressibility and flow on color Doppler imaging. Profunda Femoral Vein: No evidence of thrombus. Normal compressibility and flow on color Doppler imaging. Femoral Vein: No evidence of thrombus. Normal compressibility, respiratory phasicity  and response to augmentation. Popliteal Vein: No evidence of thrombus. Normal compressibility, respiratory phasicity and response to augmentation. Calf Veins: No evidence of thrombus. Normal compressibility and flow on color Doppler imaging. Superficial Great Saphenous Vein: No evidence of thrombus. Normal compressibility. Venous Reflux:  None. Other Findings:  None. LEFT LOWER EXTREMITY Common Femoral Vein: No evidence of thrombus. Normal compressibility, respiratory phasicity and response to augmentation. Saphenofemoral Junction: No evidence of thrombus. Normal compressibility and flow on color Doppler imaging. Profunda Femoral Vein: No evidence of thrombus. Normal compressibility and flow on color Doppler imaging. Femoral Vein: No evidence of thrombus. Normal compressibility, respiratory phasicity and response to augmentation. Popliteal Vein: No evidence of thrombus. Normal compressibility, respiratory phasicity and response to augmentation. Calf Veins: No evidence of thrombus. Normal compressibility and  flow on color Doppler imaging. Superficial Great Saphenous Vein: No evidence of thrombus. Normal compressibility. Venous Reflux:  None. Other Findings:  None. IMPRESSION: No evidence of DVT within either lower extremity. Electronically Signed   By: Sandi Mariscal M.D.   On: 06/22/2019 14:52   Ir Fluoro Guide Cv Line Right  Result Date: 07/08/2019 INDICATION: 48 year old male with a history of renal failure EXAM: TUNNELED CENTRAL VENOUS HEMODIALYSIS CATHETER PLACEMENT WITH ULTRASOUND AND FLUOROSCOPIC GUIDANCE MEDICATIONS: 2 g Ancef. The antibiotic was given in an appropriate time interval prior to skin puncture. ANESTHESIA/SEDATION: Moderate (conscious) sedation was employed during this procedure. A total of Versed 2 mg and Fentanyl 100 mcg was administered intravenously. Moderate Sedation Time: 15 minutes. The patient's level of consciousness and vital signs were monitored continuously by radiology nursing  throughout the procedure under my direct supervision. FLUOROSCOPY TIME:  Fluoroscopy Time: 0 minutes 48 seconds (7 mGy). COMPLICATIONS: None PROCEDURE: Informed written consent was obtained from the patient after a discussion of the risks, benefits, and alternatives to treatment. Questions regarding the procedure were encouraged and answered. The right neck and chest were prepped with chlorhexidine in a sterile fashion, and a sterile drape was applied covering the operative field. Maximum barrier sterile technique with sterile gowns and gloves were used for the procedure. A timeout was performed prior to the initiation of the procedure. After creating a small venotomy incision, a micropuncture kit was utilized to access the right internal jugular vein under direct, real-time ultrasound guidance after the overlying soft tissues were anesthetized with 1% lidocaine with epinephrine. Ultrasound image documentation was performed. The microwire was marked to measure appropriate internal catheter length. External tunneled length was estimated. A total tip to cuff length of 23 cm was selected. Skin and subcutaneous tissues of chest wall below the clavicle were generously infiltrated with 1% lidocaine for local anesthesia. A small stab incision was made with 11 blade scalpel. The selected hemodialysis catheter was tunneled in a retrograde fashion from the anterior chest wall to the venotomy incision. A guidewire was advanced to the level of the IVC and the micropuncture sheath was exchanged for a peel-away sheath. The catheter was then placed through the peel-away sheath with tips ultimately positioned within the superior aspect of the right atrium. Final catheter positioning was confirmed and documented with a spot radiographic image. The catheter aspirates and flushes normally. The catheter was flushed with appropriate volume heparin dwells. The catheter exit site was secured with a 0-Prolene retention suture. The venotomy  incision was closed Derma bond and sterile dressing. Dressings were applied at the chest wall. Patient tolerated the procedure well and remained hemodynamically stable throughout. No complications were encountered and no significant blood loss encountered. IMPRESSION: Status post right IJ tunneled hemodialysis catheter. Catheter ready for use. Signed, Dulcy Fanny. Dellia Nims, RPVI [facility] Radiology Electronically Signed   By: Corrie Mckusick D.O.   On: 07/08/2019 13:24   Ir US Guide Vasc Access Right  Result Date: 07/08/2019 INDICATION: 48 year old male with a history of renal failure EXAM: TUNNELED CENTRAL VENOUS HEMODIALYSIS CATHETER PLACEMENT WITH ULTRASOUND AND FLUOROSCOPIC GUIDANCE MEDICATIONS: 2 g Ancef. The antibiotic was given in an appropriate time interval prior to skin puncture. ANESTHESIA/SEDATION: Moderate (conscious) sedation was employed during this procedure. A total of Versed 2 mg and Fentanyl 100 mcg was administered intravenously. Moderate Sedation Time: 15 minutes. The patient's level of consciousness and vital signs were monitored continuously by radiology nursing throughout the procedure under my  direct supervision. FLUOROSCOPY TIME:  Fluoroscopy Time: 0 minutes 48 seconds (7 mGy). COMPLICATIONS: None PROCEDURE: Informed written consent was obtained from the patient after a discussion of the risks, benefits, and alternatives to treatment. Questions regarding the procedure were encouraged and answered. The right neck and chest were prepped with chlorhexidine in a sterile fashion, and a sterile drape was applied covering the operative field. Maximum barrier sterile technique with sterile gowns and gloves were used for the procedure. A timeout was performed prior to the initiation of the procedure. After creating a small venotomy incision, a micropuncture kit was utilized to access the right internal jugular vein under direct, real-time ultrasound  guidance after the overlying soft tissues were anesthetized with 1% lidocaine with epinephrine. Ultrasound image documentation was performed. The microwire was marked to measure appropriate internal catheter length. External tunneled length was estimated. A total tip to cuff length of 23 cm was selected. Skin and subcutaneous tissues of chest wall below the clavicle were generously infiltrated with 1% lidocaine for local anesthesia. A small stab incision was made with 11 blade scalpel. The selected hemodialysis catheter was tunneled in a retrograde fashion from the anterior chest wall to the venotomy incision. A guidewire was advanced to the level of the IVC and the micropuncture sheath was exchanged for a peel-away sheath. The catheter was then placed through the peel-away sheath with tips ultimately positioned within the superior aspect of the right atrium. Final catheter positioning was confirmed and documented with a spot radiographic image. The catheter aspirates and flushes normally. The catheter was flushed with appropriate volume heparin dwells. The catheter exit site was secured with a 0-Prolene retention suture. The venotomy incision was closed Derma bond and sterile dressing. Dressings were applied at the chest wall. Patient tolerated the procedure well and remained hemodynamically stable throughout. No complications were encountered and no significant blood loss encountered. IMPRESSION: Status post right IJ tunneled hemodialysis catheter. Catheter ready for use. Signed, Dulcy Fanny. Dellia Nims, RPVI [facility] Radiology Electronically Signed   By: Corrie Mckusick D.O.   On: 07/08/2019 13:24   Dg Chest Port 1 View  Result Date: 06/22/2019 CLINICAL DATA:  Pt c/o swelling in lower extremities x several days. Hx CHF, obesity, diabetes, nonsmoker EXAM: PORTABLE CHEST 1 VIEW COMPARISON:  Chest radiographs dated 04/28/2019, 02/23/2019 FINDINGS: Stable  cardiomediastinal contours with enlarged heart size. Central vascular congestion without overt edema. No pneumothorax or pleural effusion. Visualized skeleton is unremarkable. IMPRESSION: Cardiomegaly and central vascular congestion without overt edema. Electronically Signed   By: Audie Pinto M.D.   On: 06/22/2019 10:53   Dg Hand Complete Left  Result Date: 07/07/2019 CLINICAL DATA:  Pain and swelling, first and second metacarpals. EXAM: LEFT HAND - COMPLETE 3+ VIEW COMPARISON:  None. FINDINGS: No fracture or dislocation of the left hand. Joint spaces are well preserved. Diffuse soft tissue edema about the hand. IMPRESSION: No fracture or dislocation of the left hand. Joint spaces are well preserved. Diffuse soft tissue edema about the hand. Electronically Signed   By: Eddie Candle M.D.   On: 07/07/2019 21:18   Vas US Renal Artery Duplex  Result Date: 06/16/2019 ABDOMINAL VISCERAL High Risk Factors: Hypertension, hyperlipidemia, Diabetes, no history of                    smoking. Other Factors: Limited study secondary to obesity and bowel gas. Limitations: Air/bowel gas and obesity. Performing Technologist: Delorise Shiner RVT  Examination Guidelines: A  complete evaluation includes B-mode imaging, spectral Doppler, color Doppler, and power Doppler as needed of all accessible portions of each vessel. Bilateral testing is considered an integral part of a complete examination. Limited examinations for reoccurring indications may be performed as noted.  Duplex Findings: +----------+--------+--------+------+--------+  Mesenteric PSV cm/s EDV cm/s Plaque Comments  +----------+--------+--------+------+--------+  Aorta Mid     58                              +----------+--------+--------+------+--------+  +------------------+--------+--------+-------+  Right Renal Artery PSV cm/s EDV cm/s Comment  +------------------+--------+--------+-------+  Proximal              25                       +------------------+--------+--------+-------+  Mid                   26                      +------------------+--------+--------+-------+  Distal                27                      +------------------+--------+--------+-------+ +-----------------+--------+--------+-------+  Left Renal Artery PSV cm/s EDV cm/s Comment  +-----------------+--------+--------+-------+  Origin               40                      +-----------------+--------+--------+-------+  Proximal             29                      +-----------------+--------+--------+-------+  Mid                  27                      +-----------------+--------+--------+-------+  Distal               23                      +-----------------+--------+--------+-------+ +------------+--------+--------+--+-----------+--------+--------+---+  Right Kidney PSV cm/s EDV cm/s RI Left Kidney PSV cm/s EDV cm/s RI   +------------+--------+--------+--+-----------+--------+--------+---+  Upper Pole                        Upper Pole                         +------------+--------+--------+--+-----------+--------+--------+---+  Mid                               Mid                                +------------+--------+--------+--+-----------+--------+--------+---+  Lower Pole                        Lower Pole                         +------------+--------+--------+--+-----------+--------+--------+---+  Hilar  Hilar                              +------------+--------+--------+--+-----------+--------+--------+---+ +------------------+-------+------------------+-------+  Right Kidney               Left Kidney                 +------------------+-------+------------------+-------+  RAR                        RAR                         +------------------+-------+------------------+-------+  RAR (manual)               RAR (manual)                +------------------+-------+------------------+-------+  Cortex                     Cortex                       +------------------+-------+------------------+-------+  Cortex thickness   1.88 mm Corex thickness    1.84 mm  +------------------+-------+------------------+-------+  Kidney length (cm) 13.27   Kidney length (cm) 10.24    +------------------+-------+------------------+-------+  Summary: Renal:  Right: Normal size right kidney. No evidence of right renal artery        stenosis. RRV flow present. Limited study secondary to body        habitus. Could not insonnate right renal artery origin. Left:  Normal size of left kidney. No evidence of left renal artery        stenosis. LRV flow present. Limited study secondary to body        habitus.  *See table(s) above for measurements and observations.  Diagnosing physician: Ruta Hinds MD  Electronically signed by Ruta Hinds MD on 06/16/2019 at 10:43:24 AM.    Final      CBC Recent Labs  Lab 07/06/19 0502 07/07/19 0500 07/08/19 0955 07/09/19 0536 07/10/19 0412  WBC 9.0 8.8 8.1 8.1 8.3  HGB 9.5* 9.8* 10.4* 9.7* 9.7*  HCT 30.9* 30.8* 32.9* 30.7* 30.2*  PLT 352 398 443* 429* 481*  MCV 78.0* 77.8* 76.2* 76.2* 76.1*  MCH 24.0* 24.7* 24.1* 24.1* 24.4*  MCHC 30.7 31.8 31.6 31.6 32.1  RDW 15.1 15.5 15.3 15.2 15.4    Chemistries  Recent Labs  Lab 07/08/19 0955 07/09/19 0536 07/10/19 0412 07/11/19 0508 07/12/19 0510  NA 133* 134* 135 133* 134*  K 3.6 3.7 3.5 3.4* 3.7  CL 95* 95* 93* 93* 92*  CO2 20* 22 24 24 23   GLUCOSE 198* 202* 180* 166* 170*  BUN 99* 84* 95* 71* 92*  CREATININE 6.99* 6.25* 6.46* 5.96* 6.64*  CALCIUM 9.0 8.5* 8.5* 8.7* 8.9   --------------------------------------------------------------------------------------------------  Lab Results  Component Value Date   HGBA1C 12.1 (H) 06/23/2019   -------------------------------------------------------------------------------------------------------------------------------------------------------------------------------------------------------------------- Recent  Labs    07/09/19 1312  FERRITIN 449*  TIBC 260  IRON 125    Coagulation profile No results for input(s): INR, PROTIME in the last 168 hours. ------------------------------------------------------------------------------------------------------------------    Component Value Date/Time   BNP 119.0 (H) 06/22/2019 [place] DO on 07/12/2019 at 7:40 AM  Go to [email or web address] - for contact info  [facility] - Office  [phone]

## 2019-07-12 NOTE — Plan of Care (Signed)
  Problem: Education: Goal: Knowledge of General Education information will improve Description Including pain rating scale, medication(s)/side effects and non-pharmacologic comfort measures Outcome: Progressing   

## 2019-07-12 NOTE — Procedures (Signed)
Patient seen and examined on Hemodialysis. BP 114/80 (BP Location: Right Arm)   Pulse 81   Temp (!) 97.5 F (36.4 C) (Oral)   Resp 12   Ht 5\' 7"  (1.702 m)   Wt 132.5 kg   SpO2 98%   BMI 45.75 kg/m   QB 400 mL/ min via TDC UF goal 3L  Tolerating treatment without complaints at this time.   Madelon Lips MD [facility] pgr [phone] 11:39 AM

## 2019-07-12 NOTE — TOC Progression Note (Signed)
Transition of Care [facility]) - Progression Note    Patient Details  Name: TREMELL KISSELL MRN: [id number] Date of Birth: 1971/04/03  Transition of Care [facility]) CM/SW Contact  Bartholomew Crews, RN Phone Number: [phone] 07/12/2019, 4:24 PM  Clinical Narrative:    Spoke with patient at the bedside to discuss PT and OT evaluations. Patient insisting on going home, and does not want to go to SNF. He does not want to change home health agencies - [facility] to f/u at home. Have requested [place] orders for RN, PT, OT, Aide, and SW. Notified [employer] for delivery of bariatric rollator and 3n1. Patient states that he has already started application for medicaid, and advised that SW can assist if needed. Discussed importance of not driving himself to HD at this time. Patient reiterated that his fiance and sister are available to assist him as needed.    Expected Discharge Plan: [place] Barriers to Discharge: Continued Medical Work up  Expected Discharge Plan and Services Expected Discharge Plan: [place] arrangements for the past 2 months: Single Family Home Expected Discharge Date: 07/05/19                                     Social Determinants of Health (SDOH) Interventions    Readmission Risk Interventions Readmission Risk Prevention Plan 06/27/2019 06/23/2019 02/21/2019  Transportation Screening - Complete Complete  PCP or Specialist Appt within 3-5 Days Complete - Complete  HRI or [place] - Complete Not Complete  HRI or Home Care Consult comments - - not needed, independent, active with [facility]  Social Work Consult for [place] Planning/Counseling - Complete Complete  Palliative Care Screening - Not Applicable Not Applicable  Medication Review Press photographer) - Complete Complete  Some recent data might be hidden

## 2019-07-12 NOTE — Evaluation (Signed)
Occupational Therapy Evaluation Patient Details Name: Kelly Key MRN: [id number] DOB: 1971-04-14 Today's Date: 07/12/2019    History of Present Illness Kelly Key is a 48 y.o. male with medical history significant of obesity (BMI 49); HTN; HLD: DM; stage 4 CKD; and chronic combined CHF (EF 50-55% in 2013) presenting with increased LE edema, decreased ability to ambulate.  He felt better when he left the hospital.  During the week, his legs got weak and he fell once.  He started developing worsening LE edema.  It has been particularly bad for the last 3 days.  He denies SOB.  His weight has "probably" changed.  He can't lie flat because his feet hurt too much and feel like they weigh too much.  No PND.  No fevers.  He does not want to go to rehab.   Clinical Impression   Pt with decline in function and safety with ADLs and ADL mobility with impaired strength, balance and endurance. Pt recently began as a new HD. PTA, pt lived at home alone and reports that he was independent with ADLs and used a RW for mobility. Pt has a tub shower. Pt currently requires min guard A with increased time and effort to sit EOB, fatigues easily and mod - min A + 2 with sit - stand transitions to RW. Pt requires ues for hand placement and safety; noted dependent on weight shift/momentum and then pushes up with UEs on RW to acheive fully upright standing. Cues required multiple times for standing upright and correct positional use of RW and speed of descent during stand - sit transition. Pt requires extensive assist with LB ADLs and will not have 24/7 assist at home form his sister who lives nearby. Pt wants to return home and states that "if my legs just get stronger, I'll be ok". OT and PT explained to pt at length that going home alone before  post acute rehab would not be safe and that tasks such as getting in and out of the shower getting to the bathroom and LB ADLs would be quiet difficult for him at current level of  function. Pt with BP of 100/71 after activity and RN notified. Pt would benefit from acute OT services to address impairments to maximize level of function and safety    Follow Up Recommendations  SNF    Equipment Recommendations  Other (comment)(TBD at next level of care)    Recommendations for Other Services       Precautions / Restrictions Precautions Precautions: Fall Restrictions Weight Bearing Restrictions: No      Mobility Bed Mobility Overal bed mobility: Needs Assistance Bed Mobility: Supine to Sit     Supine to sit: Min guard     General bed mobility comments: increased time, labored movement  Transfers Overall transfer level: Needs assistance Equipment used: Rolling walker (2 wheeled) Transfers: Sit to/from Stand Sit to Stand: Mod assist;Min assist;+2 physical assistance Stand pivot transfers: Min assist;+2 physical assistance       General transfer comment: Cues for hand placement and safety; noted dependent on weight shift/momentum and then pushes up with UEs on RW to acheive fully upright standing. Cues required multiple times for standing upright and correct positional use of RW and speed of descent during stand - sit transition    Balance Overall balance assessment: Needs assistance Sitting-balance support: Feet supported;No upper extremity supported Sitting balance-Leahy Scale: Fair Sitting balance - Comments: seated at bedside   Standing balance support: During functional  activity;Bilateral upper extremity supported Standing balance-Leahy Scale: Poor                             ADL either performed or assessed with clinical judgement   ADL Overall ADL's : Needs assistance/impaired Eating/Feeding: Independent;Sitting   Grooming: Wash/dry hands;Wash/dry face;Sitting;Min guard   Upper Body Bathing: Min guard;Sitting   Lower Body Bathing: Maximal assistance;Sitting/lateral leans   Upper Body Dressing : Min guard;Sitting   Lower  Body Dressing: Total assistance   Toilet Transfer: Moderate assistance;Minimal assistance;+2 for safety/equipment;Cueing for safety;Cueing for sequencing;Ambulation;RW   Toileting- Clothing Manipulation and Hygiene: Total assistance       Functional mobility during ADLs: Moderate assistance;Minimal assistance;+2 for physical assistance;Cueing for safety;Cueing for sequencing;Rolling walker       Vision Patient Visual Report: No change from baseline       Perception     Praxis      Pertinent Vitals/Pain Pain Assessment: Faces Faces Pain Scale: Hurts little more Pain Location: RUE at HD access site Pain Descriptors / Indicators: Aching;Sore Pain Intervention(s): Monitored during session;Repositioned     Hand Dominance Right   Extremity/Trunk Assessment Upper Extremity Assessment Upper Extremity Assessment: Generalized weakness   Lower Extremity Assessment Lower Extremity Assessment: Defer to PT evaluation       Communication Communication Communication: No difficulties   Cognition Arousal/Alertness: Awake/alert Behavior During Therapy: WFL for tasks assessed/performed Overall Cognitive Status: Within Functional Limits for tasks assessed                                     General Comments       Exercises     Shoulder Instructions      Home Living Family/patient expects to be discharged to:: Private residence Living Arrangements: Alone Available Help at Discharge: Friend(s) Type of Home: House Home Access: Level entry     Home Layout: One level     Bathroom Shower/Tub: Teacher, early years/pre: Standard     Home Equipment: Environmental consultant - 2 wheels          Prior Functioning/Environment Level of Independence: Independent with assistive device(s)                 OT Problem List: Decreased strength;Impaired balance (sitting and/or standing);Pain;Decreased activity tolerance;Decreased knowledge of use of DME or AE       OT Treatment/Interventions: Self-care/ADL training;DME and/or AE instruction;Therapeutic activities;Therapeutic exercise;Patient/family education    OT Goals(Current goals can be found in the care plan section) Acute Rehab OT Goals Patient Stated Goal: return home OT Goal Formulation: With patient Time For Goal Achievement: 07/26/19 Potential to Achieve Goals: Good ADL Goals Pt Will Perform Grooming: with min guard assist;standing Pt Will Perform Upper Body Bathing: with supervision;with set-up;sitting Pt Will Perform Lower Body Bathing: with mod assist;sitting/lateral leans;sit to/from stand Pt Will Perform Upper Body Dressing: with supervision;with set-up;sitting Pt Will Transfer to Toilet: with min assist;ambulating;bedside commode Pt Will Perform Toileting - Clothing Manipulation and hygiene: with mod assist;sit to/from stand Additional ADL Goal #1: Pt will complete bed mobility with sup to sit EOB in prep for UB ADLs and transfers  OT Frequency: Min 2X/week   Barriers to D/C: Decreased caregiver support  pt lives alone and sister can only help minimally each day       Co-evaluation PT/OT/SLP Co-Evaluation/Treatment: Yes Reason for Co-Treatment: For patient/therapist  safety   OT goals addressed during session: ADL's and self-care;Proper use of [employer] equipment and DME      AM-PAC OT "6 Clicks" Daily Activity     Outcome Measure Help from another person eating meals?: None Help from another person taking care of personal grooming?: A Little Help from another person toileting, which includes using toliet, bedpan, or urinal?: Total Help from another person bathing (including washing, rinsing, drying)?: Total Help from another person to put on and taking off regular upper body clothing?: A Little Help from another person to put on and taking off regular lower body clothing?: Total 6 Click Score: 13   End of Session Equipment Utilized During Treatment: Gait belt;Rolling  walker Nurse Communication: Mobility status  Activity Tolerance: Patient limited by fatigue Patient left: in chair;with call bell/phone within reach  OT Visit Diagnosis: Unsteadiness on feet (R26.81);Other abnormalities of gait and mobility (R26.89);Muscle weakness (generalized) (M62.81);Pain Pain - Right/Left: Right Pain - part of body: Arm                Time: [id number] OT Time Calculation (min): 33 min Charges:  OT General Charges $OT Visit: 1 Visit OT Evaluation $OT Eval Moderate Complexity: 1 Mod    Britt Bottom 07/12/2019, 3:53 PM

## 2019-07-12 NOTE — Progress Notes (Signed)
Inpatient Rehabilitation-Admissions Coordinator   Noted pt required more physical assist today after dialysis. Pt may be more appropriate for CIR at this time. AC has spoken to Dr. Avon Gully and requested IP Rehab Consult. An AC will follow up tomorrow for further assessment.   Jhonnie Garner, OTR/L  Rehab Admissions Coordinator  [phone] 07/12/2019 5:20 PM

## 2019-07-12 NOTE — Progress Notes (Signed)
[facility]    NEPHROLOGY PROGRESS NOTE  SUBJECTIVE: HD today.  Wants to be discharged as soon as he can.     OBJECTIVE:  Vitals:   07/12/19 1100 07/12/19 1108  BP: 115/61 114/80  Pulse: 81 81  Resp: 11 12  Temp:  (!) 97.5 F (36.4 C)  SpO2:  98%    Intake/Output Summary (Last 24 hours) at 07/12/2019 1135 Last data filed at 07/12/2019 1108 Gross per 24 hour  Intake 593 ml  Output 4988 ml  Net -4395 ml      General:  AAOx3 NAD Neck:  No JVD CV:  Heart RRR  Lungs: clear anteriorly Abd:  abd SNT/ND with normal BS Extremities: +2 bilateral lower extremity edema.   MEDICATIONS:  . allopurinol  200 mg Oral Daily  . amLODipine  10 mg Oral Daily  . aspirin EC  81 mg Oral Daily  . atorvastatin  80 mg Oral Daily  . carvedilol  25 mg Oral BID WC  . Chlorhexidine Gluconate Cloth  6 each Topical Q0600  . cloNIDine  0.3 mg Oral TID  . ezetimibe  10 mg Oral QPM  . fluticasone  1 spray Each Nare Daily  . furosemide  80 mg Intravenous BID  . gabapentin  100 mg Oral TID  . heparin      . heparin  5,000 Units Subcutaneous Q8H  . insulin aspart  0-20 Units Subcutaneous TID WC  . insulin aspart  0-5 Units Subcutaneous QHS  . insulin detemir  35 Units Subcutaneous BID  . linaclotide  145 mcg Oral QAC breakfast  . potassium chloride  20 mEq Oral Once  . sevelamer carbonate  800 mg Oral TID WC  . sodium chloride flush  3 mL Intravenous Q12H  . traMADol  50 mg Oral Once       LABS:   CBC Latest Ref Rng & Units 07/12/2019 07/10/2019 07/09/2019  WBC 4.0 - 10.5 K/uL 8.8 8.3 8.1  Hemoglobin 13.0 - 17.0 g/dL 10.1(L) 9.7(L) 9.7(L)  Hematocrit 39.0 - 52.0 % 31.5(L) 30.2(L) 30.7(L)  Platelets 150 - 400 K/uL 489(H) 481(H) 429(H)    CMP Latest Ref Rng & Units 07/12/2019 07/11/2019 07/10/2019  Glucose 70 - 99 mg/dL 170(H) 166(H) 180(H)  BUN 6 - 20 mg/dL 92(H) 71(H) 95(H)  Creatinine 0.61 - 1.24 mg/dL 6.64(H) 5.96(H) 6.46(H)  Sodium 135 - 145 mmol/L 134(L) 133(L) 135   Potassium 3.5 - 5.1 mmol/L 3.7 3.4(L) 3.5  Chloride 98 - 111 mmol/L 92(L) 93(L) 93(L)  CO2 22 - 32 mmol/L 23 24 24   Calcium 8.9 - 10.3 mg/dL 8.9 8.7(L) 8.5(L)  Total Protein 6.5 - 8.1 g/dL - - -  Total Bilirubin 0.3 - 1.2 mg/dL - - -  Alkaline Phos 38 - 126 U/L - - -  AST 15 - 41 U/L - - -  ALT 0 - 44 U/L - - -    Lab Results  Component Value Date   CALCIUM 8.9 07/12/2019   PHOS 8.7 (H) 07/12/2019       Component Value Date/Time   COLORURINE STRAW (A) 06/23/2019 0500   APPEARANCEUR CLEAR 06/23/2019 0500   LABSPEC 1.010 06/23/2019 0500   PHURINE 6.0 06/23/2019 0500   GLUCOSEU >=500 (A) 06/23/2019 0500   HGBUR SMALL (A) 06/23/2019 0500   BILIRUBINUR NEGATIVE 06/23/2019 0500   KETONESUR NEGATIVE 06/23/2019 0500   PROTEINUR >=300 (A) 06/23/2019 0500   UROBILINOGEN 0.2 06/11/2012 0518   NITRITE NEGATIVE 06/23/2019 0500  LEUKOCYTESUR NEGATIVE 06/23/2019 0500   No results found for: PHART, PCO2ART, PO2ART, HCO3, TCO2, ACIDBASEDEF, O2SAT     Component Value Date/Time   IRON 125 07/09/2019 1312   TIBC 260 07/09/2019 1312   FERRITIN 449 (H) 07/09/2019 1312   IRONPCTSAT 48 (H) 07/09/2019 1312       ASSESSMENT/PLAN:    Kelly Franchina Asheis an 48 y.o.malewitha PMH significant for dCHF, DM, HTN, HLD, morbid obesity, fatty liver disease, and CKD stage 4 followed by Dr. Lowanda Foster with nephrotic range proteinuria (documented in past as >11 grams/24 hours) who was recently hospitalized on 06/22/2019 [facility] with a 2 week history of worsening lower extremity edema and abdominal girth not responsive to increasing doses of torsemide as an outpatient. He has had accelerated HTN and workup for renal artery stenosis was underway with [name] however that has not been scheduled.During that hospitalization there were extensive discussions with the pt educating him about the need to initiate dialysis but he refused wanting to give a trial with outpt diuretics. In the past Kelly Key hadexpressed  that he would "rather die than go on dialysis" and is followed by Dr. Lowanda Foster, however on further discussion he always states that he's not ready to die. Patient went home with torsemide 60mg  BID and unfortunately did not respond even though he was compliant.   1.  End-stage renal disease. Status post [facility] 07/08/2019 and first HD.  3rd HD today.  Patient is very upset about the discussion of a permanent vascular access placement, stating he doesn't want to go through it.  Will readdress in the future--> will try again tomorrow 9/16  2.  Hypertension.  Should improve with ultrafiltration.  On home dose of amlodipine.  3.  Acute on chronic diastolic congestive heart failure. Improving with dialysis.  4.  Malignant hypertension.  MRA pending of the renal arteries.  Will monitor blood pressure with dialysis.  Doubt intervention on any significant renal artery stenosis would make a difference in his renal function.    5.  Diabetes mellitus.  Overall poor control with a hemoglobin A1c of 12.4.  6.  Volume overload.  Should improve with ultrafiltration on dialysis.  7.  Anemia.  Is iron replete.  Will dose aranesp once BP improved  8.  BMD. Cont  phosphate binders.

## 2019-07-12 NOTE — Patient Outreach (Signed)
Original referral received for [facility] follow up, pt has been hospitalized 10 days, case closed, RN CM sent in basket message to hospital liason [employer] with udpate.  Jacqlyn Larsen [facility] [place] Coordinator [phone]

## 2019-07-13 LAB — RENAL FUNCTION PANEL
Albumin: 2.3 g/dL — ABNORMAL LOW (ref 3.5–5.0)
Anion gap: 15 (ref 5–15)
BUN: 62 mg/dL — ABNORMAL HIGH (ref 6–20)
CO2: 23 mmol/L (ref 22–32)
Calcium: 8.7 mg/dL — ABNORMAL LOW (ref 8.9–10.3)
Chloride: 95 mmol/L — ABNORMAL LOW (ref 98–111)
Creatinine, Ser: 6.1 mg/dL — ABNORMAL HIGH (ref 0.61–1.24)
GFR calc Af Amer: 11 mL/min — ABNORMAL LOW (ref >60)
GFR calc non Af Amer: 10 mL/min — ABNORMAL LOW (ref >60)
Glucose, Bld: 147 mg/dL — ABNORMAL HIGH (ref 70–99)
Phosphorus: 7.6 mg/dL — ABNORMAL HIGH (ref 2.5–4.6)
Potassium: 4.3 mmol/L (ref 3.5–5.1)
Sodium: 133 mmol/L — ABNORMAL LOW (ref 135–145)

## 2019-07-13 LAB — GLUCOSE, CAPILLARY
Glucose-Capillary: 171 mg/dL — ABNORMAL HIGH (ref 70–99)
Glucose-Capillary: 214 mg/dL — ABNORMAL HIGH (ref 70–99)
Glucose-Capillary: 153 mg/dL — ABNORMAL HIGH (ref 70–99)

## 2019-07-13 NOTE — Plan of Care (Signed)
  Problem: Education: Goal: Knowledge of General Education information will improve Description: Including pain rating scale, medication(s)/side effects and non-pharmacologic comfort measures Outcome: Progressing   Problem: Pain Managment: Goal: General experience of comfort will improve Outcome: Progressing   

## 2019-07-13 NOTE — PMR Pre-admission (Addendum)
PMR Admission Coordinator Pre-Admission Assessment  Patient: Kelly Key is an 48 y.o., male MRN: [id number] DOB: [birth year] Height: 5' 7"  (170.2 cm) Weight: 132 kg  Insurance Information HMO:     PPO:      PCP:      IPA:      80/20: yes     OTHER:  PRIMARY: medicare part A and B      Policy#: [id number]      Subscriber: patient CM Name:       Phone#:      Fax#:  Pre-Cert#:       Employer:  Benefits:  Phone #: online     Name: verified eligibility online via [employer] on 07/14/2019 Eff. Date: part A and B effective 05/27/2012     Deduct: $1,408      Out of Pocket Max: NA      Life Max: NA CIR: 100%      SNF: days 1-20, 100%, days 21-100, 80% Outpatient: 80%     Co-Pay: 20% Home Health: 100%      Co-Pay: 0% DME: 80%     Co-Pay: 20% Providers: Pt's choice  SECONDARY: None      Policy#:       Subscriber:  CM Name:       Phone#:      Fax#:  Pre-Cert#:       Employer:  Benefits:  Phone #:      Name: Eff. Date:      Deduct:       Out of Pocket Max:       Life Max:  CIR:      SNF:  Outpatient:      Co-Pay:  Home Health:       Co-Pay: DME:      Co-Pay:   Medicaid Application Date:       Case Manager:  Disability Application Date:       Case Worker:   The "Data Collection Information Summary" for patients in Inpatient Rehabilitation Facilities with attached "Privacy Act [place] Records" was provided and verbally reviewed with: Patient and Family  Emergency Contact Information Contact Information    Name Relation Home Work Mobile   Edmonson Sister   [phone]   Edrick Kins   [phone]      Current Medical History  Patient Admitting Diagnosis: Debility due to multiple medical issues  History of Present Illness: Kelly Key is a 48 year old male with history of T2DM--poorly controlled, malignant hypertension, morbid obesity, CKD stage IV who was recently admitted to [facility] 8/26- 06/27/19  2-week history of worsening of peripheral edema and abdominal girth.   He was started aggressive IV diuresis for acute on chronic renal failure with anasarca and Nephrology recommended dialysis however patient adamantly declined this and preferred to go home with increase in diuretics.  He was readmitted on 07/03/2019 with worsening of lower extremity edema with inability to ambulate, inability to lie flat and foot pain.  After extensive conversations, he was agreeable to start hemodialysis, tunneled HD catheter placed by [name] on 9/11 and hemodialysis initiated.  Blood pressures continue to be labile and MRA renal arteries ordered to rule out stenosis but nephrology doubts any intervention would make a difference in renal status.  To follow-up with [name] after discharge he continues to refuse placement of permanent access.  Acute on chronic diastolic failure improving with dialysis with decrease in anasarca.  He continues to have issues with debility affecting ADLs and mobility.  CIR recommended due to recent functional decline.     Patient's medical record from [facility] has been reviewed by the rehabilitation admission coordinator and physician.  Past Medical History  Past Medical History:  Diagnosis Date  . Acute diastolic CHF (congestive heart failure) ([place]) 06/11/2012   EF 50-55% [facility])  . Diabetes mellitus    A1c 11.5 (06/11/2012).  . Gout   . Hepatic steatosis 06/11/2012   Elevated LFTs  . Hyperlipemia   . Malignant hypertension   . Microcytic anemia 06/12/2012  . Obesity     Family History   family history includes Asthma in his mother; Diabetes in his father, sister, and sister; Gout in his mother; Heart failure in his father; Hypertension in his brother; Pancreatic cancer in his brother.  Prior Rehab/Hospitalizations Has the patient had prior rehab or hospitalizations prior to admission? Yes  Has the patient had major surgery during 100 days prior to admission? Yes   Current Medications  Current  Facility-Administered Medications:  .  0.9 %  sodium chloride infusion, 250 mL, Intravenous, PRN, Karmen Bongo, MD .  acetaminophen (TYLENOL) tablet 650 mg, 650 mg, Oral, Q4H PRN, Karmen Bongo, MD, 650 mg at 07/08/19 1439 .  albuterol (PROVENTIL) (2.5 MG/3ML) 0.083% nebulizer solution 3 mL, 3 mL, Inhalation, Q4H PRN, Karmen Bongo, MD .  allopurinol (ZYLOPRIM) tablet 200 mg, 200 mg, Oral, Daily, Emokpae, Courage, MD, 200 mg at 07/15/19 1000 .  amLODipine (NORVASC) tablet 10 mg, 10 mg, Oral, Daily, Finnigan, Nancy A, DO, 10 mg at 07/15/19 1001 .  aspirin EC tablet 81 mg, 81 mg, Oral, Daily, Karmen Bongo, MD, 81 mg at 07/15/19 1000 .  atorvastatin (LIPITOR) tablet 80 mg, 80 mg, Oral, Daily, Karmen Bongo, MD, 80 mg at 07/15/19 1000 .  calcium carbonate (dosed in mg elemental calcium) suspension 500 mg of elemental calcium, 500 mg of elemental calcium, Oral, Q6H PRN, Karmen Bongo, MD .  camphor-menthol [facility]) lotion 1 application, 1 application, Topical, N8G PRN **AND** hydrOXYzine (ATARAX/VISTARIL) tablet 25 mg, 25 mg, Oral, Q8H PRN, Karmen Bongo, MD .  carvedilol (COREG) tablet 25 mg, 25 mg, Oral, BID WC, Karmen Bongo, MD, 25 mg at 07/15/19 1000 .  Chlorhexidine Gluconate Cloth 2 % PADS 6 each, 6 each, Topical, Q0600, Claudia Desanctis, MD, 6 each at 07/15/19 [phone] .  cloNIDine (CATAPRES) tablet 0.3 mg, 0.3 mg, Oral, TID, Karmen Bongo, MD, 0.3 mg at 07/15/19 1001 .  docusate sodium (ENEMEEZ) enema 283 mg, 1 enema, Rectal, PRN, Karmen Bongo, MD .  ezetimibe (ZETIA) tablet 10 mg, 10 mg, Oral, QPM, Karmen Bongo, MD, 10 mg at 07/13/19 1746 .  feeding supplement (NEPRO CARB STEADY) liquid 237 mL, 237 mL, Oral, TID PRN, Karmen Bongo, MD .  fluticasone (FLONASE) 50 MCG/ACT nasal spray 1 spray, 1 spray, Each Nare, Daily, Karmen Bongo, MD, 1 spray at 07/13/19 1306 .  furosemide (LASIX) tablet 40 mg, 40 mg, Oral, BID, Amin, Ankit Chirag, MD .  gabapentin (NEURONTIN) capsule  100 mg, 100 mg, Oral, TID, Karmen Bongo, MD, 100 mg at 07/15/19 1000 .  heparin injection 5,000 Units, 5,000 Units, Subcutaneous, Q8H, Ardis Rowan, PA-C, 5,000 Units at 07/15/19 1414 .  HYDROcodone-acetaminophen (NORCO/VICODIN) 5-325 MG per tablet 1-2 tablet, 1-2 tablet, Oral, Q6H PRN, Bodenheimer, Charles A, NP, 1 tablet at 07/15/19 [phone] .  insulin aspart (novoLOG) injection 0-20 Units, 0-20 Units, Subcutaneous, TID WC, Karmen Bongo, MD, 4 Units at 07/15/19 1241 .  insulin aspart (novoLOG) injection  0-5 Units, 0-5 Units, Subcutaneous, QHS, Karmen Bongo, MD, 2 Units at 07/15/19 0038 .  insulin detemir (LEVEMIR) injection 35 Units, 35 Units, Subcutaneous, BID, Emokpae, Courage, MD, 35 Units at 07/15/19 1241 .  linaclotide (LINZESS) capsule 145 mcg, 145 mcg, Oral, QAC breakfast, Karmen Bongo, MD, 145 mcg at 07/15/19 1000 .  ondansetron (ZOFRAN) injection 4 mg, 4 mg, Intravenous, Q6H PRN, Karmen Bongo, MD .  potassium chloride SA (K-DUR) CR tablet 20 mEq, 20 mEq, Oral, Once, Claudia Desanctis, MD, Stopped at 07/06/19 [phone] .  sevelamer carbonate (RENVELA) tablet 800 mg, 800 mg, Oral, TID WC, Finnigan, Nancy A, DO, 800 mg at 07/15/19 1000 .  sodium chloride flush (NS) 0.9 % injection 3 mL, 3 mL, Intravenous, Q12H, Karmen Bongo, MD, 3 mL at 07/15/19 0941 .  sodium chloride flush (NS) 0.9 % injection 3 mL, 3 mL, Intravenous, PRN, Karmen Bongo, MD .  sorbitol 70 % solution 30 mL, 30 mL, Oral, PRN, Karmen Bongo, MD .  traMADol Veatrice Bourbon) tablet 50 mg, 50 mg, Oral, Once, Blount, Lolita Cram, NP  Patients Current Diet:  Diet Order            Diet renal/carb modified with fluid restriction Diet-HS Snack? Nothing; Fluid restriction: 1200 mL Fluid; Room service appropriate? Yes; Fluid consistency: Thin  Diet effective now              Precautions / Restrictions Precautions Precautions: Fall Restrictions Weight Bearing Restrictions: No   Has the patient had 2 or more falls or  a fall with injury in the past year? No  Prior Activity Level Community (5-7x/wk): until 3 weeks ago, pt was active and Independent; drove, retired heavy Radio producer  Prior Functional Level Self Care: Did the patient need help bathing, dressing, using the toilet or eating? Independent  Indoor Mobility: Did the patient need assistance with walking from room to room (with or without device)? Independent  Stairs: Did the patient need assistance with internal or external stairs (with or without device)? Needed some help  Functional Cognition: Did the patient need help planning regular tasks such as shopping or remembering to take medications? Independent  Home Assistive Devices / Equipment Home Assistive Devices/Equipment: None Home Equipment: Walker - 2 wheels  Prior Device Use: Indicate devices/aids used by the patient prior to current illness, exacerbation or injury? Walker only use for past 3 weeks when functional decline began  Current Functional Level Cognition  Overall Cognitive Status: Within Functional Limits for tasks assessed Orientation Level: Oriented X4 General Comments: very motivated    Extremity Assessment (includes Sensation/Coordination)  Upper Extremity Assessment: Generalized weakness  Lower Extremity Assessment: Defer to PT evaluation    ADLs  Overall ADL's : Needs assistance/impaired Eating/Feeding: Independent, Sitting Grooming: Wash/dry hands, Wash/dry face, Sitting, Min guard Upper Body Bathing: Min guard, Sitting Lower Body Bathing: Maximal assistance, Sitting/lateral leans Upper Body Dressing : Min guard, Sitting Lower Body Dressing: Total assistance Toilet Transfer: Moderate assistance, Minimal assistance, +2 for safety/equipment, Cueing for safety, Cueing for sequencing, Ambulation, RW Toileting- Clothing Manipulation and Hygiene: Total assistance Functional mobility during ADLs: Moderate assistance, Minimal assistance, +2 for physical  assistance, Cueing for safety, Cueing for sequencing, Rolling walker    Mobility  Overal bed mobility: Needs Assistance Bed Mobility: Supine to Sit Supine to sit: Supervision, HOB elevated General bed mobility comments: increased time and effort, +rail    Transfers  Overall transfer level: Needs assistance Equipment used: Rolling walker (2 wheeled) Transfers: Sit to/from Stand Sit to  Stand: Min assist Stand pivot transfers: Min assist, +2 physical assistance General transfer comment: increased time and effort to power up and stabilize standing balance    Ambulation / Gait / Stairs / Wheelchair Mobility  Ambulation/Gait Ambulation/Gait assistance: Herbalist (Feet): 10 Feet(x 2) Assistive device: Rolling walker (2 wheeled) Gait Pattern/deviations: Step-through pattern, Trunk flexed General Gait Details: heavy reliance on RW; slow, guarded gait; 10' x 2 trials Gait velocity: decreased Gait velocity interpretation: <1.8 ft/sec, indicate of risk for recurrent falls    Posture / Balance Dynamic Sitting Balance Sitting balance - Comments: seated at bedside Balance Overall balance assessment: Needs assistance Sitting-balance support: Feet supported, No upper extremity supported Sitting balance-Leahy Scale: Fair Sitting balance - Comments: seated at bedside Standing balance support: During functional activity, Bilateral upper extremity supported Standing balance-Leahy Scale: Poor Standing balance comment: reliant on UE support    Special needs/care consideration BiPAP/CPAP : no CPM : no Continuous Drip IV  Dialysis : yes        Days : T/TH/SAT  Life Vest : no Oxygen no Special Bed : no Trach Size : no Wound Vac (area) : no      Location : no Skin ecchymosis to abdomen                              Bowel mgmt: last BM 9/13, continent Bladder mgmt: continent Diabetic mgmt: Yes Behavioral consideration : no Chemo/radiation : no   Previous Home Environment (from  acute therapy documentation) Living Arrangements: Alone Available Help at Discharge: Friend(s) Type of Home: House Home Layout: One level Home Access: Level entry Bathroom Shower/Tub: Chiropodist: Standard Home Care Services: No  Discharge Living Setting Plans for Discharge Living Setting: Patient's home, Lives with (comment)(lives with fiance [name]) Type of Home at Discharge: House Discharge Home Layout: One level Discharge Home Access: Level entry Discharge Bathroom Shower/Tub: Tub/shower unit Discharge Bathroom Toilet: Standard Discharge Bathroom Accessibility: Yes How Accessible: Accessible via walker Does the patient have any problems obtaining your medications?: No  Social/Family/Support Systems Patient Roles: Other (Comment)(has fiance, has sister nearby who is supportive) Contact Information: sister: Hilda Blades: [phone]; fiance Jolayne Haines): [phone]; [phone] Anticipated Caregiver: sister (CNA at nursing home): works 3pm-11pm; but can assist with ADLs other times; Fiance works 3rd shift (7pm-7am) and can assist when home Anticipated Caregiver's Contact Information: see above Ability/Limitations of Caregiver: Min A Caregiver Availability: Intermittent-however, fiance (whom he lives with) states she will assist him after her shifts and has been for awhile now. Discharge Plan Discussed with Primary Caregiver: Yes(confirmed with fiance) Is Caregiver In Agreement with Plan?: Yes Does Caregiver/Family have Issues with Lodging/Transportation while Pt is in Rehab?: No  Goals/Additional Needs Patient/Family Goal for Rehab: PT/OT: Mod I/Supervision; SLP: NA Expected length of stay: 6-8 days Cultural Considerations: NA Dietary Needs: renal/carb modified with fluid restriction; fluid restriction: 1200 mL; thin lqiuids Equipment Needs: TBD Special Service Needs: new to HD (T/TH/SAT) call [place] (HD Coordinator) prior to [place]. [phone] Pt/Family Agrees  to Admission and willing to participate: Yes Program Orientation Provided & Reviewed with Pt/Caregiver Including Roles  & Responsibilities: Yes(pt and fiance)  Barriers to Discharge: Lack of/limited family support, Hemodialysis  Decrease burden of Care through IP rehab admission: NA  Possible need for SNF placement upon discharge: Not anticipated. Pt has good prognosis for further progress through CIR. Pt can have intermittent assist at home and has wc accessible home for entry.  Patient Condition: I have reviewed medical records from [facility], spoken with PT, MD, and patient. I met with patient at the bedside for inpatient rehabilitation assessment.  Patient will benefit from ongoing PT and OT, can actively participate in 3 hours of therapy a day 5 days of the week, and can make measurable gains during the admission.  Patient will also benefit from the coordinated team approach during an Inpatient Acute Rehabilitation admission.  The patient will receive intensive therapy as well as Rehabilitation physician, nursing, social worker, and care management interventions.  Due to safety, skin/wound care, disease management, medication administration, pain management and patient education the patient requires 24 hour a day rehabilitation nursing.  The patient is currently min assist with mobility and basic ADLs.  Discharge setting and therapy post discharge at home with home health is anticipated.  Patient has agreed to participate in the Acute Inpatient Rehabilitation Program and will admit today.  Preadmission Screen Completed By:  Jhonnie Garner, 07/13/2019 5:47 PM, with brief updates by Shann Medal, PT, DPT on 07/15/19 at 2:36 PM  ______________________________________________________________________   Discussed status with Dr. Posey Pronto on 07/15/19  at 2:36 PM  and received approval for tentative admission tomorrow.  Admission Coordinator:  Michel Santee, PT, time 2:36 PM Sudie Grumbling  07/15/19    Assessment/Plan: Diagnosis: Debility  1. Does the need for close, 24 hr/day Medical supervision in concert with the patient's rehab needs make it unreasonable for this patient to be served in a less intensive setting? Yes 2. Co-Morbidities requiring supervision/potential complications: [id number] controlled, malignant hypertension, morbid obesity, CKD stage IV, now on HD 3. Due to bowel management, safety, skin/wound care, disease management and patient education, does the patient require 24 hr/day rehab nursing? Yes 4. Does the patient require coordinated care of a physician, rehab nurse, PT (1-2 hrs/day, 5 days/week) and OT (1-2 hrs/day, 5 days/week) to address physical and functional deficits in the context of the above medical diagnosis(es)? Yes Addressing deficits in the following areas: balance, endurance, locomotion, strength, transferring, bathing, dressing, toileting and psychosocial support 5. Can the patient actively participate in an intensive therapy program of at least 3 hrs of therapy 5 days a week? Yes 6. The potential for patient to make measurable gains while on inpatient rehab is good 7. Anticipated functional outcomes upon discharge from inpatients are: modified independent and supervision PT, modified independent and supervision OT, n/a SLP 8. Estimated rehab length of stay to reach the above functional goals is: 7-10 days. 9. Anticipated D/C setting: Home 10. Anticipated post D/C treatments: HH therapy and Home excercise program 11. Overall Rehab/Functional Prognosis: good  MD Signature: Delice Lesch, MD, ABPMR

## 2019-07-13 NOTE — Progress Notes (Signed)
Inpatient Rehab Admissions:  Inpatient Rehab Consult received.  I met with pt at the bedside for rehabilitation assessment. Pt verbalizes need for rehab due to difficulty ambulating and generalized weakness. I reviewed program details and discussed anticipated length of stay and other expectations. Pt in agreement to pursue CIR at this time. Unfortunately we do not have a bed available in CIR for this patient. Will continue to follow up daily for possible admission, pending functional need and bed availability. Pt aware.   Jhonnie Garner, OTR/L  Rehab Admissions Coordinator  [phone] 07/13/2019 11:42 AM

## 2019-07-13 NOTE — Progress Notes (Signed)
[facility]    NEPHROLOGY PROGRESS NOTE  SUBJECTIVE: s/p HD yesterday.  CIR eval underway.  Next HD tomorrow.     OBJECTIVE:  Vitals:   07/13/19 0437 07/13/19 1014  BP: 110/78 107/74  Pulse: 78 75  Resp: 16 18  Temp: 97.8 F (36.6 C)   SpO2: 93% 97%    Intake/Output Summary (Last 24 hours) at 07/13/2019 1221 Last data filed at 07/13/2019 0920 Gross per 24 hour  Intake 960 ml  Output 300 ml  Net 660 ml      General:  AAOx3 NAD Neck:  No JVD CV:  Heart RRR  Lungs: clear anteriorly Abd:  abd SNT/ND with normal BS Extremities: +1 bilateral lower extremity edema. ACCESS: R IJ TDC   MEDICATIONS:  . allopurinol  200 mg Oral Daily  . amLODipine  10 mg Oral Daily  . aspirin EC  81 mg Oral Daily  . atorvastatin  80 mg Oral Daily  . carvedilol  25 mg Oral BID WC  . Chlorhexidine Gluconate Cloth  6 each Topical Q0600  . cloNIDine  0.3 mg Oral TID  . ezetimibe  10 mg Oral QPM  . fluticasone  1 spray Each Nare Daily  . furosemide  80 mg Intravenous BID  . gabapentin  100 mg Oral TID  . heparin  5,000 Units Subcutaneous Q8H  . insulin aspart  0-20 Units Subcutaneous TID WC  . insulin aspart  0-5 Units Subcutaneous QHS  . insulin detemir  35 Units Subcutaneous BID  . linaclotide  145 mcg Oral QAC breakfast  . potassium chloride  20 mEq Oral Once  . sevelamer carbonate  800 mg Oral TID WC  . sodium chloride flush  3 mL Intravenous Q12H  . traMADol  50 mg Oral Once       LABS:   CBC Latest Ref Rng & Units 07/12/2019 07/10/2019 07/09/2019  WBC 4.0 - 10.5 K/uL 8.8 8.3 8.1  Hemoglobin 13.0 - 17.0 g/dL 10.1(L) 9.7(L) 9.7(L)  Hematocrit 39.0 - 52.0 % 31.5(L) 30.2(L) 30.7(L)  Platelets 150 - 400 K/uL 489(H) 481(H) 429(H)    CMP Latest Ref Rng & Units 07/13/2019 07/12/2019 07/11/2019  Glucose 70 - 99 mg/dL 147(H) 170(H) 166(H)  BUN 6 - 20 mg/dL 62(H) 92(H) 71(H)  Creatinine 0.61 - 1.24 mg/dL 6.10(H) 6.64(H) 5.96(H)  Sodium 135 - 145 mmol/L 133(L) 134(L)  133(L)  Potassium 3.5 - 5.1 mmol/L 4.3 3.7 3.4(L)  Chloride 98 - 111 mmol/L 95(L) 92(L) 93(L)  CO2 22 - 32 mmol/L 23 23 24   Calcium 8.9 - 10.3 mg/dL 8.7(L) 8.9 8.7(L)  Total Protein 6.5 - 8.1 g/dL - - -  Total Bilirubin 0.3 - 1.2 mg/dL - - -  Alkaline Phos 38 - 126 U/L - - -  AST 15 - 41 U/L - - -  ALT 0 - 44 U/L - - -    Lab Results  Component Value Date   CALCIUM 8.7 (L) 07/13/2019   PHOS 7.6 (H) 07/13/2019       Component Value Date/Time   COLORURINE STRAW (A) 06/23/2019 0500   APPEARANCEUR CLEAR 06/23/2019 0500   LABSPEC 1.010 06/23/2019 0500   PHURINE 6.0 06/23/2019 0500   GLUCOSEU >=500 (A) 06/23/2019 0500   HGBUR SMALL (A) 06/23/2019 0500   BILIRUBINUR NEGATIVE 06/23/2019 0500   KETONESUR NEGATIVE 06/23/2019 0500   PROTEINUR >=300 (A) 06/23/2019 0500   UROBILINOGEN 0.2 06/11/2012 0518   NITRITE NEGATIVE 06/23/2019 0500   LEUKOCYTESUR NEGATIVE 06/23/2019 0500  No results found for: PHART, PCO2ART, PO2ART, HCO3, TCO2, ACIDBASEDEF, O2SAT     Component Value Date/Time   IRON 125 07/09/2019 1312   TIBC 260 07/09/2019 1312   FERRITIN 449 (H) 07/09/2019 1312   IRONPCTSAT 48 (H) 07/09/2019 1312       ASSESSMENT/PLAN:    Kelly Tyska Asheis an 48 y.o.malewitha PMH significant for dCHF, DM, HTN, HLD, morbid obesity, fatty liver disease, and CKD stage 4 followed by Dr. Lowanda Foster with nephrotic range proteinuria (documented in past as >11 grams/24 hours) who was recently hospitalized on 06/21/2049 [facility] with a 2 week history of worsening lower extremity edema and abdominal girth not responsive to increasing doses of torsemide as an outpatient. He has had accelerated HTN and workup for renal artery stenosis was underway with [name] however that has not been scheduled.During that hospitalization there were extensive discussions with the pt educating him about the need to initiate dialysis but he refused wanting to give a trial with outpt diuretics. In the past Mr. Tallerico  hadexpressed that he would "rather die than go on dialysis" and is followed by Dr. Lowanda Foster, however on further discussion he always states that he's not ready to die. Patient went home with torsemide 60mg  BID and unfortunately did not respond even though he was compliant.   1.  End-stage renal disease. Status post [facility] 07/07/2049 and first HD.  3rd HD 50/15.  Discussed permanent access again with him today--> he still wants to hold off.    2.  Hypertension.  Should improve with ultrafiltration.  On home dose of amlodipine.  3.  Acute on chronic diastolic congestive heart failure. Improving with dialysis.  4.  Malignant hypertension.  MRA pending of the renal arteries.  Will monitor blood pressure with dialysis.  Doubt intervention on any significant renal artery stenosis would make a difference in his renal function.    5.  Diabetes mellitus.  Overall poor control with a hemoglobin A1c of 12.4.  6.  Volume overload.  Should improve with ultrafiltration on dialysis.  7.  Anemia.  Is iron replete.  Hgb 10.1, will follow and dose Aranesp as appropriate  8.  BMD. Cont  phosphate binders.

## 2019-07-13 NOTE — Progress Notes (Signed)
Patient Demographics:    Kelly Key, is a 47 y.o. male, DOB - 08/11/1971, VZS:[id number]  Admit date - 07/03/2019   Admitting Physician Karmen Bongo, MD  Outpatient Primary MD for the patient is [name], MD  LOS - 8  HPI 48 y.o. male with medical history significant of obesity (BMI 61); HTN; HLD: DM; stage 4 CKD; and chronic combined CHF (EF 50-55% in 2013) presenting with increased LE edema, decreased ambulation - readmitted on 07/03/2019 with anasarca in the setting of dCHF exacerbation- -transfered to [facility] for tunneled HD catheter placement and initiation of hemodialysis. Patient admitted as above with worsening kidney function in the setting of heart failure exacerbation and volume overload.  Patient now tolerating dialysis quite well, continues to diurese somewhat well but in general clinically improving.  Currently awaiting further evaluation with nephrology for ongoing dialysis and outpatient dialysis slot.  Chief Complaint  Patient presents with   Leg Swelling       Subjective:   No acute issues or events overnight, evaluated in dialysis, denies overt chest pain, shortness of breath, nausea, vomiting, diarrhea, constipation.  Patient states his ambulation is still somewhat limited-states he walks maybe 10 feet yesterday.   Assessment  & Plan :    Principal Problem:   Anasarca Active Problems:   HTN (hypertension)   Morbid obesity (HCC)   CKD (chronic kidney disease) stage 5, GFR less than 15 ml/min (HCC)   Uncontrolled type 2 diabetes mellitus with hyperglycemia, with long-term current use of insulin (HCC)   Dyslipidemia   Chronic combined systolic and diastolic CHF (congestive heart failure) (HCC)   Volume overload   Congestive heart failure (HCC)   Anasarca, likely multifactorial - in the setting of HFpEF/worsening kidney function as below, POA, improving --last known EF per  echo from 06/23/2019 is 50 to 55%, -patient with history of chronic low EF CHF--- presenting with acute on chronic preserved EF CHF exacerbation, -Previous home medication torsemide 60 mg twice daily -Continue fluid control (spironolactone, Lasix, dialysis) per nephrology -Status post right IJ tunneled hemodialysis catheter with IR on 07/08/2019 -Nephrology following for dialysis and assistance with management of his volume status/electrolytes  Ambulatory dysfunction, DOA, secondary to above -Patient admitted and was basically nonambulatory -With diuresis and dialysis patient has had market improvement in volume status as above, fortunately due to prolonged fluid gain and aggressive diuresis patient has markedly weak ambulatory status. -She remains unsafe for disposition home, patient is requesting discharge home but after lengthy discussion over the past few days patient understands that with a walker and assist he is able to ambulate upwards of 10 feet and requires ongoing physical therapy and rehab. -CIR is evaluating for possible placement in the next 24 to 48 hours pending bed availability, as patient appears to meet the criteria at this point  CKD 4- progressively worsening -now ESRD on dialysis -Prior renal ultrasound without obstructive uropathy -Nephrology following, appreciate insight recommendations, tolerated dialysis quite well -Patient continues to make moderate amount of urine -although downtrending with dialysis -Patient has been clipped to Tuesday Thursday Saturday dialysis slot per coordinator  HTN, essential -Workup for possible renal artery stenosis including MRI of renal arteries -  Dr Oneida Alar has scheduled this on 07/15/19 -Stable,  continue Coreg 25 mg twice daily, clonidine 0.3 mg TID, amlodipine 10 daily  Chronic anemia of chronic diease 2/2 CKD superimposed on iron deficiency anemia -Hemoglobin 9.7 and stable - attempt to minimize blood draws -Defer decision on EPO agent  to nephrology service -Serum iron is 11, iron saturation 4 - s/p fereheme 9/10  Insulin dependent DM2, uncontrolled -last A1c 12.4, reflecting uncontrolled diabetes -Resume levemir - currently 35 unit twice daily -moderately well controlled currently -Continue sliding scale insulin, hypoglycemic protocol  Intermittent chest discomfort/dyspnea on exertion, likely in the setting of poor volume control/ascites, resolved -Questionably effusion vs musculoskeletal in nature -Not a candidate for CTA chest due to renal function, chronic lower extremity edema is most likely due to renal and heart failure -patient had negative venous Dopplers of the lower extremity on 02/21/2019, also had repeat negative lower extremity Dopplers on 04/28/2019, lower extremity venous Dopplers were again negative on 06/22/2019 -continue DVT prophylaxis, low clinical index of suspicion for PE at this time  Code Status : FULL Disposition Plan:Remain inpatient -previously requiring tunneled HD catheter placement and initiation of HD -patient has now clipped, and ambulatory dysfunction pending admission to CIR as bed becomes available. Consults  :  Nephrology/general surgery/IR Procedures- - tunneled HD catheter placement 07/08/2019 DVT Prophylaxis  :  Heparin  Lab Results  Component Value Date   PLT 489 (H) 07/12/2019    Inpatient Medications  Scheduled Meds:  allopurinol  200 mg Oral Daily   amLODipine  10 mg Oral Daily   aspirin EC  81 mg Oral Daily   atorvastatin  80 mg Oral Daily   carvedilol  25 mg Oral BID WC   Chlorhexidine Gluconate Cloth  6 each Topical Q0600   cloNIDine  0.3 mg Oral TID   ezetimibe  10 mg Oral QPM   fluticasone  1 spray Each Nare Daily   furosemide  80 mg Intravenous BID   gabapentin  100 mg Oral TID   heparin  5,000 Units Subcutaneous Q8H   insulin aspart  0-20 Units Subcutaneous TID WC   insulin aspart  0-5 Units Subcutaneous QHS   insulin detemir  35 Units  Subcutaneous BID   linaclotide  145 mcg Oral QAC breakfast   potassium chloride  20 mEq Oral Once   sevelamer carbonate  800 mg Oral TID WC   sodium chloride flush  3 mL Intravenous Q12H   traMADol  50 mg Oral Once   Continuous Infusions:  sodium chloride     PRN Meds:.sodium chloride, acetaminophen, albuterol, calcium carbonate (dosed in mg elemental calcium), camphor-menthol **AND** hydrOXYzine, docusate sodium, feeding supplement (NEPRO CARB STEADY), HYDROcodone-acetaminophen, ondansetron (ZOFRAN) IV, sodium chloride flush, sorbitol    Anti-infectives (From admission, onward)   Start     Dose/Rate Route Frequency Ordered Stop   07/08/19 1227  ceFAZolin (ANCEF) 2-4 GM/100ML-% IVPB    Note to Pharmacy: Manuela Neptune   : cabinet override      07/08/19 1227 07/08/19 1225   07/06/19 1045  ceFAZolin (ANCEF) 3 g in dextrose 5 % 50 mL IVPB  Status:  Discontinued     3 g 100 mL/hr over 30 Minutes Intravenous On call to O.R. 07/06/19 1037 07/06/19 1231        Objective:   Vitals:   07/12/19 1631 07/12/19 2145 07/13/19 0437 07/13/19 0500  BP: 112/72 (!) 142/90 110/78   Pulse: 83 82 78   Resp: _0 Temp: 98.5 F (36.9 C) 98.4 F (36.9  C) 97.8 F (36.6 C)   TempSrc: Oral Oral Oral   SpO2: 94% 98% 93%   Weight:  132.9 kg  132.9 kg  Height:        Wt Readings from Last 3 Encounters:  07/13/19 132.9 kg  06/27/19 (!) 141.6 kg  06/16/19 (!) 138.8 kg     Intake/Output Summary (Last 24 hours) at 07/13/2019 0736 Last data filed at 07/12/2019 2200 Gross per 24 hour  Intake 720 ml  Output 3438 ml  Net -2718 ml     Physical Exam Gen:- Awake Alert, resting comfortably in bed, appears nontoxic HEENT:- [place].AT, No sclera icterus Neck-Supple Neck,No JVD, R sided tunneled IJ dialysis cath noted Lungs-scant bibasilar rales without overt rhonchi or wheeze, no accessory muscle use CV- S1, S2 normal, regular ,  Abd-  +ve B.Sounds, Abd Soft, increased truncal adiposity    Extremities- 1+ pitting edema distally in lower extremities bilaterally Neuro-generalized weakness, no new focal deficits, no tremors   Data Review:   Micro Results Recent Results (from the past 240 hour(s))  SARS Coronavirus 2 [facility] order, Performed in [facility] hospital lab) Nasopharyngeal Nasopharyngeal Swab     Status: None   Collection Time: 07/03/19  4:08 PM   Specimen: Nasopharyngeal Swab  Result Value Ref Range Status   SARS Coronavirus 2 NEGATIVE NEGATIVE Final    Comment: (NOTE) If result is NEGATIVE SARS-CoV-2 target nucleic acids are NOT DETECTED. The SARS-CoV-2 RNA is generally detectable in upper and lower  respiratory specimens during the acute phase of infection. The lowest  concentration of SARS-CoV-2 viral copies this assay can detect is 250  copies / mL. A negative result does not preclude SARS-CoV-2 infection  and should not be used as the sole basis for treatment or other  patient management decisions.  A negative result may occur with  improper specimen collection / handling, submission of specimen other  than nasopharyngeal swab, presence of viral mutation(s) within the  areas targeted by this assay, and inadequate number of viral copies  (<250 copies / mL). A negative result must be combined with clinical  observations, patient history, and epidemiological information. If result is POSITIVE SARS-CoV-2 target nucleic acids are DETECTED. The SARS-CoV-2 RNA is generally detectable in upper and lower  respiratory specimens dur ing the acute phase of infection.  Positive  results are indicative of active infection with SARS-CoV-2.  Clinical  correlation with patient history and other diagnostic information is  necessary to determine patient infection status.  Positive results do  not rule out bacterial infection or co-infection with other viruses. If result is PRESUMPTIVE POSTIVE SARS-CoV-2 nucleic acids MAY BE PRESENT.   A presumptive positive result  was obtained on the submitted specimen  and confirmed on repeat testing.  While 2019 novel coronavirus  (SARS-CoV-2) nucleic acids may be present in the submitted sample  additional confirmatory testing may be necessary for epidemiological  and / or clinical management purposes  to differentiate between  SARS-CoV-2 and other Sarbecovirus currently known to infect humans.  If clinically indicated additional testing with an alternate test  methodology [phone]) is advised. The SARS-CoV-2 RNA is generally  detectable in upper and lower respiratory sp ecimens during the acute  phase of infection. The expected result is Negative. Fact Sheet for Patients:  StrictlyIdeas.no Fact Sheet for Healthcare Providers: BankingDealers.co.za This test is not yet approved or cleared by the [place] FDA and has been authorized for detection and/or diagnosis of SARS-CoV-2 by FDA under an Emergency  Use Authorization (EUA).  This EUA will remain in effect (meaning this test can be used) for the duration of the COVID-19 declaration under Section 564(b)(1) of the Act, 21 U.S.C. section 360bbb-3(b)(1), unless the authorization is terminated or revoked sooner. Performed at [facility], [address] [place], [place] [address]   Surgical PCR screen     Status: Abnormal   Collection Time: 07/06/19 12:57 AM   Specimen: Nasal Mucosa; Nasal Swab  Result Value Ref Range Status   MRSA, PCR POSITIVE (A) NEGATIVE Final   Staphylococcus aureus POSITIVE (A) NEGATIVE Final    Comment: RESULT CALLED TO, READ BACK BY AND VERIFIED WITH: J RHUE,RN _0  07/06/19 MKELLY (NOTE) The Xpert SA Assay (FDA approved for NASAL specimens in patients 81 years of age and older), is one component of a comprehensive surveillance program. It is not intended to diagnose infection nor to guide or monitor treatment. Performed at [facility], [address], [place], [place] [address]       Radiology Reports Dg Chest 2 View  Result Date: 07/03/2019 CLINICAL DATA:  History of end-stage renal disease and CHF. EXAM: CHEST - 2 VIEW COMPARISON:  Chest radiograph 06/22/2019, 04/28/2019 FINDINGS: Stable cardiomediastinal contours with enlarged heart size. A few minimal linear opacities at the right lung base likely reflect atelectasis or scar. No new focal infiltrate or evidence of edema. No pneumothorax or pleural effusion. No acute findings in the visualized skeleton. IMPRESSION: No evidence of active disease. Electronically Signed   By: Audie Pinto M.D.   On: 07/03/2019 13:57   US Renal  Result Date: 06/23/2019 CLINICAL DATA:  Acute renal failure EXAM: RENAL / URINARY TRACT ULTRASOUND COMPLETE COMPARISON:  None. FINDINGS: Right Kidney: Renal measurements: 11.7 x 4.8 x 6.1 cm = volume: 177 mL . Echogenicity within normal limits. No mass or hydronephrosis visualized. Left Kidney: Renal measurements: 11.1 x 5.3 x 5.9 cm = volume: 181 mL. Echogenicity within normal limits. No mass or hydronephrosis visualized. Bladder: Appears normal for degree of bladder distention. IMPRESSION: Normal bilateral renal ultrasound. Electronically Signed   By: Kathreen Devoid   On: 06/23/2019 11:46   US Venous Img Lower Bilateral  Result Date: 06/22/2019 CLINICAL DATA:  Bilateral lower extremity pain and edema. Evaluate for DVT. EXAM: BILATERAL LOWER EXTREMITY VENOUS DOPPLER ULTRASOUND TECHNIQUE: Gray-scale sonography with graded compression, as well as color Doppler and duplex ultrasound were performed to evaluate the lower extremity deep venous systems from the level of the common femoral vein and including the common femoral, femoral, profunda femoral, popliteal and calf veins including the posterior tibial, peroneal and gastrocnemius veins when visible. The superficial great saphenous vein was also interrogated. Spectral Doppler was utilized to evaluate flow at rest and with distal augmentation maneuvers in  the common femoral, femoral and popliteal veins. COMPARISON:  None. FINDINGS: RIGHT LOWER EXTREMITY Common Femoral Vein: No evidence of thrombus. Normal compressibility, respiratory phasicity and response to augmentation. Saphenofemoral Junction: No evidence of thrombus. Normal compressibility and flow on color Doppler imaging. Profunda Femoral Vein: No evidence of thrombus. Normal compressibility and flow on color Doppler imaging. Femoral Vein: No evidence of thrombus. Normal compressibility, respiratory phasicity and response to augmentation. Popliteal Vein: No evidence of thrombus. Normal compressibility, respiratory phasicity and response to augmentation. Calf Veins: No evidence of thrombus. Normal compressibility and flow on color Doppler imaging. Superficial Great Saphenous Vein: No evidence of thrombus. Normal compressibility. Venous Reflux:  None. Other Findings:  None. LEFT LOWER EXTREMITY Common Femoral Vein: No evidence of  thrombus. Normal compressibility, respiratory phasicity and response to augmentation. Saphenofemoral Junction: No evidence of thrombus. Normal compressibility and flow on color Doppler imaging. Profunda Femoral Vein: No evidence of thrombus. Normal compressibility and flow on color Doppler imaging. Femoral Vein: No evidence of thrombus. Normal compressibility, respiratory phasicity and response to augmentation. Popliteal Vein: No evidence of thrombus. Normal compressibility, respiratory phasicity and response to augmentation. Calf Veins: No evidence of thrombus. Normal compressibility and flow on color Doppler imaging. Superficial Great Saphenous Vein: No evidence of thrombus. Normal compressibility. Venous Reflux:  None. Other Findings:  None. IMPRESSION: No evidence of DVT within either lower extremity. Electronically Signed   By: Sandi Mariscal M.D.   On: 06/22/2019 14:52   Ir Fluoro Guide Cv Line Right  Result Date: 07/08/2019 INDICATION: 48 year old male with a history of renal  failure EXAM: TUNNELED CENTRAL VENOUS HEMODIALYSIS CATHETER PLACEMENT WITH ULTRASOUND AND FLUOROSCOPIC GUIDANCE MEDICATIONS: 2 g Ancef. The antibiotic was given in an appropriate time interval prior to skin puncture. ANESTHESIA/SEDATION: Moderate (conscious) sedation was employed during this procedure. A total of Versed 2 mg and Fentanyl 100 mcg was administered intravenously. Moderate Sedation Time: 15 minutes. The patient's level of consciousness and vital signs were monitored continuously by radiology nursing throughout the procedure under my direct supervision. FLUOROSCOPY TIME:  Fluoroscopy Time: 0 minutes 48 seconds (7 mGy). COMPLICATIONS: None PROCEDURE: Informed written consent was obtained from the patient after a discussion of the risks, benefits, and alternatives to treatment. Questions regarding the procedure were encouraged and answered. The right neck and chest were prepped with chlorhexidine in a sterile fashion, and a sterile drape was applied covering the operative field. Maximum barrier sterile technique with sterile gowns and gloves were used for the procedure. A timeout was performed prior to the initiation of the procedure. After creating a small venotomy incision, a micropuncture kit was utilized to access the right internal jugular vein under direct, real-time ultrasound guidance after the overlying soft tissues were anesthetized with 1% lidocaine with epinephrine. Ultrasound image documentation was performed. The microwire was marked to measure appropriate internal catheter length. External tunneled length was estimated. A total tip to cuff length of 23 cm was selected. Skin and subcutaneous tissues of chest wall below the clavicle were generously infiltrated with 1% lidocaine for local anesthesia. A small stab incision was made with 11 blade scalpel. The selected hemodialysis catheter was tunneled in a retrograde fashion from the anterior chest wall to the venotomy incision. A guidewire was  advanced to the level of the IVC and the micropuncture sheath was exchanged for a peel-away sheath. The catheter was then placed through the peel-away sheath with tips ultimately positioned within the superior aspect of the right atrium. Final catheter positioning was confirmed and documented with a spot radiographic image. The catheter aspirates and flushes normally. The catheter was flushed with appropriate volume heparin dwells. The catheter exit site was secured with a 0-Prolene retention suture. The venotomy incision was closed Derma bond and sterile dressing. Dressings were applied at the chest wall. Patient tolerated the procedure well and remained hemodynamically stable throughout. No complications were encountered and no significant blood loss encountered. IMPRESSION: Status post right IJ tunneled hemodialysis catheter. Catheter ready for use. Signed, Dulcy Fanny. Dellia Nims, RPVI [facility] Radiology Electronically Signed   By: Corrie Mckusick D.O.   On: 07/08/2019 13:24   Ir US Guide Vasc Access Right  Result Date: 07/08/2019 INDICATION: 48 year old male with a history of renal failure  EXAM: TUNNELED CENTRAL VENOUS HEMODIALYSIS CATHETER PLACEMENT WITH ULTRASOUND AND FLUOROSCOPIC GUIDANCE MEDICATIONS: 2 g Ancef. The antibiotic was given in an appropriate time interval prior to skin puncture. ANESTHESIA/SEDATION: Moderate (conscious) sedation was employed during this procedure. A total of Versed 2 mg and Fentanyl 100 mcg was administered intravenously. Moderate Sedation Time: 15 minutes. The patient's level of consciousness and vital signs were monitored continuously by radiology nursing throughout the procedure under my direct supervision. FLUOROSCOPY TIME:  Fluoroscopy Time: 0 minutes 48 seconds (7 mGy). COMPLICATIONS: None PROCEDURE: Informed written consent was obtained from the patient after a discussion of the risks, benefits, and alternatives to  treatment. Questions regarding the procedure were encouraged and answered. The right neck and chest were prepped with chlorhexidine in a sterile fashion, and a sterile drape was applied covering the operative field. Maximum barrier sterile technique with sterile gowns and gloves were used for the procedure. A timeout was performed prior to the initiation of the procedure. After creating a small venotomy incision, a micropuncture kit was utilized to access the right internal jugular vein under direct, real-time ultrasound guidance after the overlying soft tissues were anesthetized with 1% lidocaine with epinephrine. Ultrasound image documentation was performed. The microwire was marked to measure appropriate internal catheter length. External tunneled length was estimated. A total tip to cuff length of 23 cm was selected. Skin and subcutaneous tissues of chest wall below the clavicle were generously infiltrated with 1% lidocaine for local anesthesia. A small stab incision was made with 11 blade scalpel. The selected hemodialysis catheter was tunneled in a retrograde fashion from the anterior chest wall to the venotomy incision. A guidewire was advanced to the level of the IVC and the micropuncture sheath was exchanged for a peel-away sheath. The catheter was then placed through the peel-away sheath with tips ultimately positioned within the superior aspect of the right atrium. Final catheter positioning was confirmed and documented with a spot radiographic image. The catheter aspirates and flushes normally. The catheter was flushed with appropriate volume heparin dwells. The catheter exit site was secured with a 0-Prolene retention suture. The venotomy incision was closed Derma bond and sterile dressing. Dressings were applied at the chest wall. Patient tolerated the procedure well and remained hemodynamically stable throughout. No complications were encountered and no significant blood loss encountered. IMPRESSION:  Status post right IJ tunneled hemodialysis catheter. Catheter ready for use. Signed, Dulcy Fanny. Dellia Nims, RPVI [facility]iology [facility] Radiology Electronically Signed   By: Corrie Mckusick D.O.   On: 07/08/2019 13:24   Dg Chest Port 1 View  Result Date: 06/22/2019 CLINICAL DATA:  Pt c/o swelling in lower extremities x several days. Hx CHF, obesity, diabetes, nonsmoker EXAM: PORTABLE CHEST 1 VIEW COMPARISON:  Chest radiographs dated 04/28/2019, 02/23/2019 FINDINGS: Stable cardiomediastinal contours with enlarged heart size. Central vascular congestion without overt edema. No pneumothorax or pleural effusion. Visualized skeleton is unremarkable. IMPRESSION: Cardiomegaly and central vascular congestion without overt edema. Electronically Signed   By: Audie Pinto M.D.   On: 06/22/2019 10:53   Dg Hand Complete Left  Result Date: 07/07/2019 CLINICAL DATA:  Pain and swelling, first and second metacarpals. EXAM: LEFT HAND - COMPLETE 3+ VIEW COMPARISON:  None. FINDINGS: No fracture or dislocation of the left hand. Joint spaces are well preserved. Diffuse soft tissue edema about the hand. IMPRESSION: No fracture or dislocation of the left hand. Joint spaces are well preserved. Diffuse soft tissue edema about the hand. Electronically Signed   By:  Eddie Candle M.D.   On: 07/07/2019 21:18   Vas US Renal Artery Duplex  Result Date: 06/16/2019 ABDOMINAL VISCERAL High Risk Factors: Hypertension, hyperlipidemia, Diabetes, no history of                    smoking. Other Factors: Limited study secondary to obesity and bowel gas. Limitations: Air/bowel gas and obesity. Performing Technologist: Delorise Shiner RVT  Examination Guidelines: A complete evaluation includes B-mode imaging, spectral Doppler, color Doppler, and power Doppler as needed of all accessible portions of each vessel. Bilateral testing is considered an integral part of a complete examination. Limited examinations for  reoccurring indications may be performed as noted.  Duplex Findings: +----------+--------+--------+------+--------+  Mesenteric PSV cm/s EDV cm/s Plaque Comments  +----------+--------+--------+------+--------+  Aorta Mid     58                              +----------+--------+--------+------+--------+  +------------------+--------+--------+-------+  Right Renal Artery PSV cm/s EDV cm/s Comment  +------------------+--------+--------+-------+  Proximal              25                      +------------------+--------+--------+-------+  Mid                   26                      +------------------+--------+--------+-------+  Distal                27                      +------------------+--------+--------+-------+ +-----------------+--------+--------+-------+  Left Renal Artery PSV cm/s EDV cm/s Comment  +-----------------+--------+--------+-------+  Origin               40                      +-----------------+--------+--------+-------+  Proximal             29                      +-----------------+--------+--------+-------+  Mid                  27                      +-----------------+--------+--------+-------+  Distal               23                      +-----------------+--------+--------+-------+ +------------+--------+--------+--+-----------+--------+--------+---+  Right Kidney PSV cm/s EDV cm/s RI Left Kidney PSV cm/s EDV cm/s RI   +------------+--------+--------+--+-----------+--------+--------+---+  Upper Pole                        Upper Pole                         +------------+--------+--------+--+-----------+--------+--------+---+  Mid                               Mid                                +------------+--------+--------+--+-----------+--------+--------+---+  Sonoita                         +------------+--------+--------+--+-----------+--------+--------+---+  Hilar                             Hilar                               +------------+--------+--------+--+-----------+--------+--------+---+ +------------------+-------+------------------+-------+  Right Kidney               Left Kidney                 +------------------+-------+------------------+-------+  RAR                        RAR                         +------------------+-------+------------------+-------+  RAR (manual)               RAR (manual)                +------------------+-------+------------------+-------+  Cortex                     Cortex                      +------------------+-------+------------------+-------+  Cortex thickness   1.88 mm Corex thickness    1.84 mm  +------------------+-------+------------------+-------+  Kidney length (cm) 13.27   Kidney length (cm) 10.24    +------------------+-------+------------------+-------+  Summary: Renal:  Right: Normal size right kidney. No evidence of right renal artery        stenosis. RRV flow present. Limited study secondary to body        habitus. Could not insonnate right renal artery origin. Left:  Normal size of left kidney. No evidence of left renal artery        stenosis. LRV flow present. Limited study secondary to body        habitus.  *See table(s) above for measurements and observations.  Diagnosing physician: Ruta Hinds MD  Electronically signed by Ruta Hinds MD on 06/16/2019 at 10:43:24 AM.    Final      CBC Recent Labs  Lab 07/07/19 0500 07/08/19 0955 07/09/19 0536 07/10/19 0412 07/12/19 0759  WBC 8.8 8.1 8.1 8.3 8.8  HGB 9.8* 10.4* 9.7* 9.7* 10.1*  HCT 30.8* 32.9* 30.7* 30.2* 31.5*  PLT 398 443* 429* 481* 489*  MCV 77.8* 76.2* 76.2* 76.1* 77.4*  MCH 24.7* 24.1* 24.1* 24.4* 24.8*  MCHC 31.8 31.6 31.6 32.1 32.1  RDW 15.5 15.3 15.2 15.4 15.4    Chemistries  Recent Labs  Lab 07/09/19 0536 07/10/19 0412 07/11/19 0508 07/12/19 0510 07/13/19 0456  NA 134* 135 133* 134* 133*  K 3.7 3.5 3.4* 3.7 4.3  CL 95* 93* 93* 92* 95*  CO2 _0 GLUCOSE 202* 180* 166*  170* 147*  BUN 84* 95* 71* 92* 62*  CREATININE 6.25* 6.46* 5.96* 6.64* 6.10*  CALCIUM 8.5* 8.5* 8.7* 8.9 8.7*   --------------------------------------------------------------------------------------------------  Lab Results  Component Value Date   HGBA1C 12.1 (H) 06/23/2019   -------------------------------------------------------------------------------------------------------------------------------------------------------------------------------------------------------------------- No results for  input(s): VITAMINB12, FOLATE, FERRITIN, TIBC, IRON, RETICCTPCT in the last 72 hours.  Coagulation profile No results for input(s): INR, PROTIME in the last 168 hours. ------------------------------------------------------------------------------------------------------------------    Component Value Date/Time   BNP 119.0 (H) 06/22/2019 [place] DO on 07/13/2019 at 7:36 AM  Go to [email or web address] - for contact info  [facility] - Office  [phone]

## 2019-07-14 LAB — RENAL FUNCTION PANEL
Albumin: 2.4 g/dL — ABNORMAL LOW (ref 3.5–5.0)
Anion gap: 18 — ABNORMAL HIGH (ref 5–15)
BUN: 89 mg/dL — ABNORMAL HIGH (ref 6–20)
CO2: 22 mmol/L (ref 22–32)
Calcium: 8.9 mg/dL (ref 8.9–10.3)
Chloride: 91 mmol/L — ABNORMAL LOW (ref 98–111)
Creatinine, Ser: 7.77 mg/dL — ABNORMAL HIGH (ref 0.61–1.24)
GFR calc Af Amer: 8 mL/min — ABNORMAL LOW (ref >60)
GFR calc non Af Amer: 7 mL/min — ABNORMAL LOW (ref >60)
Glucose, Bld: 150 mg/dL — ABNORMAL HIGH (ref 70–99)
Phosphorus: 9 mg/dL — ABNORMAL HIGH (ref 2.5–4.6)
Potassium: 4.4 mmol/L (ref 3.5–5.1)
Sodium: 131 mmol/L — ABNORMAL LOW (ref 135–145)

## 2019-07-14 LAB — GLUCOSE, CAPILLARY
Glucose-Capillary: 134 mg/dL — ABNORMAL HIGH (ref 70–99)
Glucose-Capillary: 168 mg/dL — ABNORMAL HIGH (ref 70–99)
Glucose-Capillary: 138 mg/dL — ABNORMAL HIGH (ref 70–99)
Glucose-Capillary: 219 mg/dL — ABNORMAL HIGH (ref 70–99)

## 2019-07-14 NOTE — TOC Progression Note (Addendum)
Transition of Care [facility]) - Progression Note    Patient Details  Name: Kelly Key MRN: [id number] Date of Birth: Feb 09, 1971  [facility] CM/SW Contact  Bartholomew Crews, RN Phone Number: [phone] 07/14/2019, 1:03 PM  Clinical Narrative:    Spoke with patient at the bedside. Discussed pending transition to CIR when bed available. Advised that it may be next week before bed is available and patient may not require CIR services. Discussed HH services (RN, PT, OT, Aide, and SW) through [employer]. Patient states that his fiance, Angelica Pou, is assisting with Medicaid services through [facility] in [place] with hopes for some assistance for about 6 months. Patient not agreeable to going to inpatient rehab at [facility] stating it is too far away.   Discussed refusal of DME yesterday. Patient states that he does not need a bedside commode, and that he already has a walker, but what he needs is a rollator. CM followed up with [employer] - a rollator had been ordered and will follow up with patient with correct device.   Patient states that Angelica Pou will be doing the cooking, but has questions about his diet restrictions. Will ask dietician to follow up.   Discussed transportation to and from hemodialysis. Patient wants to drive himself, but understands that he is too weak and that dialysis takes a lot out of the body. States that the dialysis clinic is 3 miles from his home and that either [place] or his sister will help with transportation and possibly Medicaid transportation.   Update: Spoke with patient's fiance, [name], on the phone. [name] agrees that [facility] inpatient rehab is too far away. Jolayne Haines has already reached out to DSS in [facility] to request extension to his existing benefits. Discussed that patient is getting a rollator and will have [place], PT, OT, Aide, SW. Patient may get a bed with CIR tomorrow, but if not will likely be ready to transition home  tomorrow. [name] verbalized understanding. Also, discussed dietary restrictions and advised of dietitian consult. Also encouraged follow up with outpatient dietitian at kidney center. [name] verbalized understanding.   CM to follow for transition of care needs.   Expected Discharge Plan: [place] Barriers to Discharge: Continued Medical Work up  Expected Discharge Plan and Services Expected Discharge Plan: [place] arrangements for the past 2 months: Single Family Home Expected Discharge Date: 07/05/19                                     Social Determinants of Health (SDOH) Interventions    Readmission Risk Interventions Readmission Risk Prevention Plan 06/27/2019 06/23/2019 02/21/2019  Transportation Screening - Complete Complete  PCP or Specialist Appt within 3-5 Days Complete - Complete  HRI or [place] - Complete Not Complete  HRI or Home Care Consult comments - - not needed, independent, active with [facility]  Social Work Consult for [place] Planning/Counseling - Complete Complete  Palliative Care Screening - Not Applicable Not Applicable  Medication Review [employer]) - Complete Complete  Some recent data might be hidden

## 2019-07-14 NOTE — Progress Notes (Signed)
PROGRESS NOTE    Kelly Key  [id number] DOB: [birth year] DOA: 07/03/2019 PCP: [name], MD   Brief Narrative: 48 y.o.malewith medical history significant ofobesity (BMI 49); HTN; HLD: DM; stage 4 CKD; and chronic combined CHF (EF 50-55% in 2013) presenting with increased LE edema, decreased ambulation - readmitted on 07/03/2019 with anasarca in the setting of dCHF exacerbation- -transfered to [facility] for tunneled HD catheter placement and initiation of hemodialysis. Patient admitted as above with worsening kidney function in the setting of heart failure exacerbation and volume overload.  Patient now tolerating dialysis quite well, continues to diurese somewhat well but in general clinically improving.  Currently awaiting further evaluation with nephrology for ongoing dialysis and outpatient dialysis slot.     Assessment & Plan:   Principal Problem:   Anasarca Active Problems:   HTN (hypertension)   Morbid obesity (HCC)   CKD (chronic kidney disease) stage 5, GFR less than 15 ml/min (HCC)   Uncontrolled type 2 diabetes mellitus with hyperglycemia, with long-term current use of insulin (HCC)   Dyslipidemia   Chronic combined systolic and diastolic CHF (congestive heart failure) (HCC)   Volume overload   Congestive heart failure (HCC)   Anasarca - Multifactorial in the setting of CHF with preserved ejection fraction and worsening renal function - Previously on torsemide 60 mg twice daily at home.  Now on Aldactone, Lasix.  Dialysis should help this as well. - Right IJ tunnel catheter placed by IR 9/11 -Echocardiogram 06/23/2019-ejection fraction 50-55%  Ambulatory dysfunction - Generalized deconditioning with fluid overload making it difficult.  PT recommends CIR, arrangements to be made pending insurance approval.  Patient is very motivated to feel stronger and go home.  ESRD on hemodialysis -Renal ultrasound-negative for obstruction. -Nephrology following,  tolerating dialysis - Clipped for outpatient dialysis TTS  Essential hypertension - Plan for evaluation for outpatient renal artery stenosis.  This can be followed up by Dr. feels -Continue Coreg, clonidine and Norvasc  Anemia of chronic disease secondary to renal dysfunction Iron deficiency anemia -Status post IV iron.  Hemoglobin stable.  Diabetes mellitus type 2 -Sliding scale Accu-Cheks.  Resume Levemir  DVT prophylaxis: Heparin Code Status: Full code Family Communication: None Disposition Plan: Pending safe disposition, hopefully he can go to CIR  Consultants:   Nephrology   Subjective: Feels okay, patient is motivated to go home.  But for now he is agreeable to go to CIR as he feels very weak in his lower extremities requiring assistance to ambulate.  Review of Systems Otherwise negative except as per HPI, including: General: Denies fever, chills, night sweats or unintended weight loss. Resp: Denies cough, wheezing, shortness of breath. Cardiac: Denies chest pain, palpitations, orthopnea, paroxysmal nocturnal dyspnea. GI: Denies abdominal pain, nausea, vomiting, diarrhea or constipation GU: Denies dysuria, frequency, hesitancy or incontinence MS: Denies muscle aches, joint pain or swelling Neuro: Denies headache, neurologic deficits (focal weakness, numbness, tingling), abnormal gait Psych: Denies anxiety, depression, SI/HI/AVH Skin: Denies new rashes or lesions ID: Denies sick contacts, exotic exposures, travel  Objective: Vitals:   07/13/19 1623 07/13/19 2151 07/14/19 0452 07/14/19 0838  BP: (!) 134/97 (!) 152/94 129/81 (!) 147/93  Pulse: 70 68 80 79  Resp: 18 18 18 18   Temp: 97.8 F (36.6 C)  98.6 F (37 C) 97.6 F (36.4 C)  TempSrc: Oral  Oral Oral  SpO2: 98% 100% 96% 94%  Weight:      Height:        Intake/Output Summary (Last 24  hours) at 07/14/2019 1326 Last data filed at 07/14/2019 1233 Gross per 24 hour  Intake 723 ml  Output 500 ml  Net 223  ml   Filed Weights   07/12/19 1108 07/12/19 2145 07/13/19 0500  Weight: 132.5 kg 132.9 kg 132.9 kg    Examination:  General exam: Appears calm and comfortable  Respiratory system: Clear to auscultation. Respiratory effort normal. Cardiovascular system: S1 & S2 heard, RRR. No JVD, murmurs, rubs, gallops or clicks.  1+ bilateral lower extremity pitting edema Gastrointestinal system: Abdomen is nondistended, soft and nontender. No organomegaly or masses felt. Normal bowel sounds heard. Central nervous system: Alert and oriented. No focal neurological deficits. Extremities: Symmetric 4 x 5 power. Skin: No rashes, lesions or ulcers Psychiatry: Judgement and insight appear normal. Mood & affect appropriate.   Right chest wall dialysis catheter now  Data Reviewed:   CBC: Recent Labs  Lab 07/08/19 0955 07/09/19 0536 07/10/19 0412 07/12/19 0759  WBC 8.1 8.1 8.3 8.8  HGB 10.4* 9.7* 9.7* 10.1*  HCT 32.9* 30.7* 30.2* 31.5*  MCV 76.2* 76.2* 76.1* 77.4*  PLT 443* 429* 481* 0000000*   Basic Metabolic Panel: Recent Labs  Lab 07/10/19 0412 07/11/19 0508 07/12/19 0510 07/13/19 0456 07/14/19 0400  NA 135 133* 134* 133* 131*  K 3.5 3.4* 3.7 4.3 4.4  CL 93* 93* 92* 95* 91*  CO2 24 24 23 23 22   GLUCOSE 180* 166* 170* 147* 150*  BUN 95* 71* 92* 62* 89*  CREATININE 6.46* 5.96* 6.64* 6.10* 7.77*  CALCIUM 8.5* 8.7* 8.9 8.7* 8.9  PHOS 8.8* 7.4* 8.7* 7.6* 9.0*   GFR: Estimated Creatinine Clearance: 14.9 mL/min (A) (by C-G formula based on SCr of 7.77 mg/dL (H)). Liver Function Tests: Recent Labs  Lab 07/10/19 0412 07/11/19 0508 07/12/19 0510 07/13/19 0456 07/14/19 0400  ALBUMIN 2.1* 2.2* 2.2* 2.3* 2.4*   No results for input(s): LIPASE, AMYLASE in the last 168 hours. No results for input(s): AMMONIA in the last 168 hours. Coagulation Profile: No results for input(s): INR, PROTIME in the last 168 hours. Cardiac Enzymes: No results for input(s): CKTOTAL, CKMB, CKMBINDEX, TROPONINI  in the last 168 hours. BNP (last 3 results) No results for input(s): PROBNP in the last 8760 hours. HbA1C: No results for input(s): HGBA1C in the last 72 hours. CBG: Recent Labs  Lab 07/13/19 1116 07/13/19 1621 07/13/19 2150 07/14/19 0703 07/14/19 1138  GLUCAP 171* 214* 153* 134* 168*   Lipid Profile: No results for input(s): CHOL, HDL, LDLCALC, TRIG, CHOLHDL, LDLDIRECT in the last 72 hours. Thyroid Function Tests: No results for input(s): TSH, T4TOTAL, FREET4, T3FREE, THYROIDAB in the last 72 hours. Anemia Panel: No results for input(s): VITAMINB12, FOLATE, FERRITIN, TIBC, IRON, RETICCTPCT in the last 72 hours. Sepsis Labs: No results for input(s): PROCALCITON, LATICACIDVEN in the last 168 hours.  Recent Results (from the past 240 hour(s))  Surgical PCR screen     Status: Abnormal   Collection Time: 07/06/19 12:57 AM   Specimen: Nasal Mucosa; Nasal Swab  Result Value Ref Range Status   MRSA, PCR POSITIVE (A) NEGATIVE Final   Staphylococcus aureus POSITIVE (A) NEGATIVE Final    Comment: RESULT CALLED TO, READ BACK BY AND VERIFIED WITH: J RHUE,RN @0419  07/06/19 MKELLY (NOTE) The Xpert SA Assay (FDA approved for NASAL specimens in patients 23 years of age and older), is one component of a comprehensive surveillance program. It is not intended to diagnose infection nor to guide or monitor treatment. Performed at [facility], [phone]  [address] [place], [place] [address]          Radiology Studies: No results found.      Scheduled Meds: . allopurinol  200 mg Oral Daily  . amLODipine  10 mg Oral Daily  . aspirin EC  81 mg Oral Daily  . atorvastatin  80 mg Oral Daily  . carvedilol  25 mg Oral BID WC  . Chlorhexidine Gluconate Cloth  6 each Topical Q0600  . cloNIDine  0.3 mg Oral TID  . ezetimibe  10 mg Oral QPM  . fluticasone  1 spray Each Nare Daily  . furosemide  80 mg Intravenous BID  . gabapentin  100 mg Oral TID  . heparin  5,000 Units Subcutaneous Q8H  .  insulin aspart  0-20 Units Subcutaneous TID WC  . insulin aspart  0-5 Units Subcutaneous QHS  . insulin detemir  35 Units Subcutaneous BID  . linaclotide  145 mcg Oral QAC breakfast  . potassium chloride  20 mEq Oral Once  . sevelamer carbonate  800 mg Oral TID WC  . sodium chloride flush  3 mL Intravenous Q12H  . traMADol  50 mg Oral Once   Continuous Infusions: . sodium chloride       LOS: 9 days   Time spent= 20 mins    Ruie Sendejo Arsenio Loader, MD [facility]  If 7PM-7AM, please contact night-coverage [email or web address] 07/14/2019, 1:26 PM

## 2019-07-14 NOTE — Plan of Care (Signed)
  Problem: Education: Goal: Knowledge of General Education information will improve Description Including pain rating scale, medication(s)/side effects and non-pharmacologic comfort measures Outcome: Progressing   

## 2019-07-14 NOTE — Care Management Important Message (Signed)
Important Message  Patient Details  Name: Kelly Key MRN: [id number] Date of Birth: 1971-04-25   Medicare Important Message Given:  Yes     [name] 07/14/2019, 12:27 PM

## 2019-07-14 NOTE — Progress Notes (Signed)
Inpatient Rehabilitation-Admissions Coordinator   Met with pt bedside. He is still interested in CIR. I have also confirmed DC support with his fiance. At this time we do not have a bed available for this patient today. Will continue to follow up daily for possible admission, pending functional need and bed availability. Pt aware.   Jhonnie Garner, OTR/L  Rehab Admissions Coordinator  [phone] 07/14/2019 3:49 PM

## 2019-07-14 NOTE — H&P (Signed)
Physical Medicine and Rehabilitation Admission H&P    Chief Complaint  Patient presents with  .  Functional decline due to renal failure     HPI: Kelly Key is a 48 year old male with history of T2DM--poorly controlled, malignant hypertension, morbid obesity, CKD stage IV who was recently admitted to [facility] 8/26- 06/27/19  2-week history of worsening of peripheral edema and abdominal girth.  History taken from chart review and patient.  He was started aggressive IV diuresis for acute on chronic renal failure with anasarca and Nephrology recommended dialysis however patient adamantly declined this and preferred to go home with increase in diuretics.  He was readmitted on 07/03/2019 due to increasing lower extremity edema, difficulty with ambulation and lying flat as well as foot pain.  He ultimately was agreeable to dialysis.  Tunneled HD catheter placed by [name] on 07/08/2019 and hemodialysis initiated.  Blood pressures remain labile and MRA renal arteries ordered to rule out stenosis but nephrology doubts any intervention would make a difference in renal status.  To follow-up with [name] after discharge he continues to refuse placement of permanent access.  Acute on chronic diastolic failure improving with dialysis with decrease in anasarca.  He continues to have issues with debility affecting ADLs and mobility.  CIR recommended due to recent functional decline.   He has been accepted at [facility] for TTS at noon  Review of Systems  Constitutional: Positive for malaise/fatigue. Negative for chills and fever.  HENT: Negative for hearing loss and tinnitus.   Eyes: Negative for blurred vision and double vision.  Respiratory: Negative for cough and shortness of breath.   Cardiovascular: Positive for chest pain (left chest pressure has resolved). Negative for palpitations.  Gastrointestinal: Positive for constipation. Negative for heartburn and nausea.  Musculoskeletal: Negative for back pain,  myalgias and neck pain.  Neurological: Positive for dizziness (occasionally with mobility. ) and weakness. Negative for headaches.  Psychiatric/Behavioral: The patient does not have insomnia.      Past Medical History:  Diagnosis Date  . Acute diastolic CHF (congestive heart failure) ([facility]) 06/11/2012   EF 50-55% [facility])  . Diabetes mellitus    A1c 11.5 (06/11/2012).  . Gout   . Hepatic steatosis 06/11/2012   Elevated LFTs  . Hyperlipemia   . Malignant hypertension   . Microcytic anemia 06/12/2012  . Obesity     Past Surgical History:  Procedure Laterality Date  . IR FLUORO GUIDE CV LINE RIGHT  07/08/2019  . IR US GUIDE VASC ACCESS RIGHT  07/08/2019  . NO PAST SURGERIES      Family History  Problem Relation Age of Onset  . Gout Mother   . Asthma Mother   . Diabetes Father   . Heart failure Father   . Diabetes Sister   . Hypertension Brother   . Pancreatic cancer Brother   . Diabetes Sister     Social History:  Lives with fiance. Retired then went on disability 4-5 years ago. Has been sedentary with limited mobility to for past  6 months due to "fluid build up"--used walker PTA.  Due to leg weakness. He reports that he has never smoked. He has never used smokeless tobacco. He reports that he does not drink alcohol or use drugs.    Allergies  Allergen Reactions  . Hydralazine   . Lisinopril Swelling    Medications Prior to Admission  Medication Sig Dispense Refill  . albuterol (VENTOLIN HFA) 108 (90 Base) MCG/ACT inhaler Inhale 2  puffs into the lungs every 4 (four) hours as needed.    [name] amLODipine (NORVASC) 10 MG tablet Take 10 mg by mouth daily.    [name] atorvastatin (LIPITOR) 80 MG tablet Take 1 tablet by mouth daily.    . Calcium Carbonate Antacid (CALCIUM CARBONATE, DOSED IN MG ELEMENTAL CALCIUM,) 1250 MG/5ML SUSP Take 5 mLs (500 mg of elemental calcium total) by mouth every 6 (six) hours as needed for indigestion. 450 mL   . carvedilol (COREG) 25 MG tablet  Take 1 tablet (25 mg total) by mouth 2 (two) times daily with a meal. 60 tablet 1  . cloNIDine (CATAPRES) 0.3 MG tablet Take 1 tablet (0.3 mg total) by mouth 3 (three) times daily. 90 tablet 0  . ergocalciferol (VITAMIN D2) 1.25 MG (50000 UT) capsule Take 50,000 Units by mouth once a week.    . ezetimibe (ZETIA) 10 MG tablet Take 10 mg by mouth every evening.    . fluticasone (FLONASE) 50 MCG/ACT nasal spray Place 1 spray into both nostrils daily. 1 g 0  . gabapentin (NEURONTIN) 100 MG capsule Take 1 capsule by mouth 3 (three) times daily.    . insulin aspart (NOVOLOG) 100 UNIT/ML injection Inject 5-15 Units into the skin 3 (three) times daily before meals.     . insulin detemir (LEVEMIR) 100 UNIT/ML injection Inject 0.35 mLs (35 Units total) into the skin 2 (two) times daily. 10 mL 1  . Insulin Pen Needle 31G X 5 MM MISC Use as directed. 100 each 0  . linaclotide (LINZESS) 145 MCG CAPS capsule Take 1 capsule (145 mcg total) by mouth daily before breakfast. 30 capsule 0  . sevelamer carbonate (RENVELA) 800 MG tablet Take 1 tablet (800 mg total) by mouth 3 (three) times daily with meals.    . torsemide (DEMADEX) 20 MG tablet Take 3 tablets (60 mg total) by mouth 2 (two) times daily. 180 tablet 1    Drug Regimen Review  Drug regimen was reviewed and remains appropriate with no significant issues identified  Home: Home Living Family/patient expects to be discharged to:: Private residence Living Arrangements: Alone Available Help at Discharge: Friend(s) Type of Home: House Home Access: Level entry Home Layout: One level Bathroom Shower/Tub: Chiropodist: Standard Home Equipment: Environmental consultant - 2 wheels   Functional History: Prior Function Level of Independence: Independent with assistive device(s)  Functional Status:  Mobility: Bed Mobility Overal bed mobility: Needs Assistance Bed Mobility: Supine to Sit Supine to sit: Supervision, HOB elevated General bed mobility  comments: increased time and effort, +rail Transfers Overall transfer level: Needs assistance Equipment used: Rolling walker (2 wheeled) Transfers: Sit to/from Stand Sit to Stand: Min assist Stand pivot transfers: Min assist, +2 physical assistance General transfer comment: increased time and effort to power up and stabilize standing balance Ambulation/Gait Ambulation/Gait assistance: Min assist Gait Distance (Feet): 10 Feet(x 2) Assistive device: Rolling walker (2 wheeled) Gait Pattern/deviations: Step-through pattern, Trunk flexed General Gait Details: heavy reliance on RW; slow, guarded gait; 10' x 2 trials Gait velocity: decreased Gait velocity interpretation: <1.8 ft/sec, indicate of risk for recurrent falls    ADL: ADL Overall ADL's : Needs assistance/impaired Eating/Feeding: Independent, Sitting Grooming: Wash/dry hands, Wash/dry face, Sitting, Min guard Upper Body Bathing: Min guard, Sitting Lower Body Bathing: Maximal assistance, Sitting/lateral leans Upper Body Dressing : Min guard, Sitting Lower Body Dressing: Total assistance Toilet Transfer: Moderate assistance, Minimal assistance, +2 for safety/equipment, Cueing for safety, Cueing for sequencing, Ambulation, RW Toileting- Clothing Manipulation  and Hygiene: Total assistance Functional mobility during ADLs: Moderate assistance, Minimal assistance, +2 for physical assistance, Cueing for safety, Cueing for sequencing, Rolling walker  Cognition: Cognition Overall Cognitive Status: Within Functional Limits for tasks assessed Orientation Level: Oriented X4 Cognition Arousal/Alertness: Awake/alert Behavior During Therapy: WFL for tasks assessed/performed Overall Cognitive Status: Within Functional Limits for tasks assessed General Comments: very motivated  Physical Exam: Blood pressure (!) 156/83, pulse 81, temperature 97.6 F (36.4 C), temperature source Oral, resp. rate 18, height 5\' 7"  (1.702 m), weight 122 kg,  SpO2 96 %. Physical Exam  Nursing note and vitals reviewed. Constitutional: He appears well-developed. No distress.  Morbidly obese  HENT:  Head: Normocephalic and atraumatic.  Eyes: EOM are normal. Right eye exhibits no discharge. Left eye exhibits no discharge.  Neck: No tracheal deviation present. No thyromegaly present.  Respiratory: Effort normal.  GI: He exhibits distension.  Musculoskeletal:     Comments: No edema or tenderness in extremities  Neurological: He is alert.  Motor: 4-/5 throughout  Skin: He is not diaphoretic.  Vascular changes bilateral lower extremities  Psychiatric: His speech is normal. His affect is blunt.    Results for orders placed or performed during the hospital encounter of 07/03/19 (from the past 48 hour(s))  Glucose, capillary     Status: Abnormal   Collection Time: 07/14/19 10:01 PM  Result Value Ref Range   Glucose-Capillary 219 (H) 70 - 99 mg/dL  Glucose, capillary     Status: Abnormal   Collection Time: 07/15/19 12:32 AM  Result Value Ref Range   Glucose-Capillary 207 (H) 70 - 99 mg/dL  CBC with Differential/Platelet     Status: Abnormal   Collection Time: 07/15/19  2:00 AM  Result Value Ref Range   WBC 9.6 4.0 - 10.5 K/uL   RBC 4.34 4.22 - 5.81 MIL/uL   Hemoglobin 10.7 (L) 13.0 - 17.0 g/dL   HCT 33.4 (L) 39.0 - 52.0 %   MCV 77.0 (L) 80.0 - 100.0 fL   MCH 24.7 (L) 26.0 - 34.0 pg   MCHC 32.0 30.0 - 36.0 g/dL   RDW 15.4 11.5 - 15.5 %   Platelets 500 (H) 150 - 400 K/uL   nRBC 0.0 0.0 - 0.2 %   Neutrophils Relative % 51 %   Neutro Abs 5.0 1.7 - 7.7 K/uL   Lymphocytes Relative 31 %   Lymphs Abs 3.0 0.7 - 4.0 K/uL   Monocytes Relative 9 %   Monocytes Absolute 0.9 0.1 - 1.0 K/uL   Eosinophils Relative 7 %   Eosinophils Absolute 0.6 (H) 0.0 - 0.5 K/uL   Basophils Relative 1 %   Basophils Absolute 0.1 0.0 - 0.1 K/uL   Immature Granulocytes 1 %   Abs Immature Granulocytes 0.07 0.00 - 0.07 K/uL    Comment: Performed at [facility], [address], [place], [place] [address]  Renal function panel     Status: Abnormal   Collection Time: 07/15/19  2:01 AM  Result Value Ref Range   Sodium 129 (L) 135 - 145 mmol/L   Potassium 4.4 3.5 - 5.1 mmol/L   Chloride 90 (L) 98 - 111 mmol/L   CO2 22 22 - 32 mmol/L   Glucose, Bld 196 (H) 70 - 99 mg/dL   BUN 106 (H) 6 - 20 mg/dL   Creatinine, Ser 8.17 (H) 0.61 - 1.24 mg/dL   Calcium 9.1 8.9 - 10.3 mg/dL   Phosphorus 9.2 (H) 2.5 - 4.6 mg/dL  Albumin 2.5 (L) 3.5 - 5.0 g/dL   GFR calc non Af Amer 7 (L) >60 mL/min   GFR calc Af Amer 8 (L) >60 mL/min   Anion gap 17 (H) 5 - 15    Comment: Performed at [facility] [address], [place], [place] [address]  Renal function panel     Status: Abnormal   Collection Time: 07/15/19  5:55 AM  Result Value Ref Range   Sodium 134 (L) 135 - 145 mmol/L   Potassium 4.0 3.5 - 5.1 mmol/L   Chloride 97 (L) 98 - 111 mmol/L   CO2 23 22 - 32 mmol/L   Glucose, Bld 144 (H) 70 - 99 mg/dL   BUN 46 (H) 6 - 20 mg/dL   Creatinine, Ser 4.61 (H) 0.61 - 1.24 mg/dL    Comment: DELTA CHECK NOTED DIALYSIS    Calcium 8.7 (L) 8.9 - 10.3 mg/dL   Phosphorus 4.7 (H) 2.5 - 4.6 mg/dL   Albumin 2.5 (L) 3.5 - 5.0 g/dL   GFR calc non Af Amer 14 (L) >60 mL/min   GFR calc Af Amer 16 (L) >60 mL/min   Anion gap 14 5 - 15    Comment: Performed at [facility], [place] [address], [place], [place] [address]  Glucose, capillary     Status: Abnormal   Collection Time: 07/15/19  7:22 AM  Result Value Ref Range   Glucose-Capillary 161 (H) 70 - 99 mg/dL  Glucose, capillary     Status: Abnormal   Collection Time: 07/15/19 11:32 AM  Result Value Ref Range   Glucose-Capillary 182 (H) 70 - 99 mg/dL  Glucose, capillary     Status: Abnormal   Collection Time: 07/15/19  4:29 PM  Result Value Ref Range   Glucose-Capillary 204 (H) 70 - 99 mg/dL  Glucose, capillary     Status: Abnormal   Collection Time: 07/15/19  8:47 PM  Result Value Ref Range    Glucose-Capillary 228 (H) 70 - 99 mg/dL  Glucose, capillary     Status: Abnormal   Collection Time: 07/16/19  6:48 AM  Result Value Ref Range   Glucose-Capillary 126 (H) 70 - 99 mg/dL  Glucose, capillary     Status: Abnormal   Collection Time: 07/16/19 11:05 AM  Result Value Ref Range   Glucose-Capillary 168 (H) 70 - 99 mg/dL   No results found.     Medical Problem List and Plan: 1.  Generalized weakness with limitations in self-care secondary to debility  Admit to CIR 2.  Antithrombotics: -DVT/anticoagulation:  Pharmaceutical: Heparin  -antiplatelet therapy: On ASA 81 mg daily 3. Pain Management: On gabapentin 3 times daily for neuropathy with hydrocodone on PRN basis  Monitor with increased mobility 4. Mood: LCSW to follow for evaluation and support  -antipsychotic agents: N/A 5. Neuropsych: This patient is capable of making decisions on his own behalf. 6. Skin/Wound Care: Routine pressure relief measures 7. Fluids/Electrolytes/Nutrition: CM/renal diet with 1200 cc FR.  8.  T2DM: Hemoglobin A1c-12.4 and poorly controlled.  Continue Levemir twice daily and use SSI for tighter blood sugar control.  Monitor blood sugars AC/HS.   Monitor with increased mobility 9.  Malignant hypertension: Monitor BP TID--follow-up with [name] for work-up to rule out RAS. Continue amlodipine, Coreg twice daily, clonidine TID,  IV furosemide BID  Monitor with increased mobility 10.  Anemia of chronic disease: Received IV iron  Labs with HD 11.  ESRD/chronic combined CHF: Anasarca multifactorial and improving with  hemodialysis.  Daily weights  Bary Leriche, PA-C 07/16/2019   I have personally performed a face to face diagnostic evaluation, including, but not limited to relevant history and physical exam findings, of this patient and developed relevant assessment and plan.  Additionally, I have reviewed and concur with the physician assistant's documentation above.  Delice Lesch, MD, ABPMR

## 2019-07-14 NOTE — Progress Notes (Signed)
[facility]    NEPHROLOGY PROGRESS NOTE  SUBJECTIVE: For HD today.  Lots of questions about outpt dialysis.  States he is looking forward to CIR.     OBJECTIVE:  Vitals:   07/14/19 0452 07/14/19 0838  BP: 129/81 (!) 147/93  Pulse: 80 79  Resp: 18 18  Temp: 98.6 F (37 C) 97.6 F (36.4 C)  SpO2: 96% 94%    Intake/Output Summary (Last 24 hours) at 07/14/2019 1147 Last data filed at 07/14/2019 1058 Gross per 24 hour  Intake 603 ml  Output 500 ml  Net 103 ml      General:  AAOx3 NAD, lying back in bed Neck:  No JVD CV:  Heart RRR  Lungs: clear anteriorly Abd:  abd SNT/ND with Kelly BS Extremities: +1 bilateral lower extremity edema. ACCESS: R IJ TDC   MEDICATIONS:  . allopurinol  200 mg Oral Daily  . amLODipine  10 mg Oral Daily  . aspirin EC  81 mg Oral Daily  . atorvastatin  80 mg Oral Daily  . carvedilol  25 mg Oral BID WC  . Chlorhexidine Gluconate Cloth  6 each Topical Q0600  . cloNIDine  0.3 mg Oral TID  . ezetimibe  10 mg Oral QPM  . fluticasone  1 spray Each Nare Daily  . furosemide  80 mg Intravenous BID  . gabapentin  100 mg Oral TID  . heparin  5,000 Units Subcutaneous Q8H  . insulin aspart  0-20 Units Subcutaneous TID WC  . insulin aspart  0-5 Units Subcutaneous QHS  . insulin detemir  35 Units Subcutaneous BID  . linaclotide  145 mcg Oral QAC breakfast  . potassium chloride  20 mEq Oral Once  . sevelamer carbonate  800 mg Oral TID WC  . sodium chloride flush  3 mL Intravenous Q12H  . traMADol  50 mg Oral Once       LABS:   CBC Latest Ref Rng & Units 07/12/2019 07/10/2019 07/09/2019  WBC 4.0 - 10.5 K/uL 8.8 8.3 8.1  Hemoglobin 13.0 - 17.0 g/dL 10.1(L) 9.7(L) 9.7(L)  Hematocrit 39.0 - 52.0 % 31.5(L) 30.2(L) 30.7(L)  Platelets 150 - 400 K/uL 489(H) 481(H) 429(H)    CMP Latest Ref Rng & Units 07/14/2019 07/13/2019 07/12/2019  Glucose 70 - 99 mg/dL 150(H) 147(H) 170(H)  BUN 6 - 20 mg/dL 89(H) 62(H) 92(H)  Creatinine 0.61 - 1.24  mg/dL 7.77(H) 6.10(H) 6.64(H)  Sodium 135 - 145 mmol/L 131(L) 133(L) 134(L)  Potassium 3.5 - 5.1 mmol/L 4.4 4.3 3.7  Chloride 98 - 111 mmol/L 91(L) 95(L) 92(L)  CO2 22 - 32 mmol/L 22 23 23   Calcium 8.9 - 10.3 mg/dL 8.9 8.7(L) 8.9  Total Protein 6.5 - 8.1 g/dL - - -  Total Bilirubin 0.3 - 1.2 mg/dL - - -  Alkaline Phos 38 - 126 U/L - - -  AST 15 - 41 U/L - - -  ALT 0 - 44 U/L - - -    Lab Results  Component Value Date   CALCIUM 8.9 07/14/2019   PHOS 9.0 (H) 07/14/2019       Component Value Date/Time   COLORURINE STRAW (A) 06/23/2019 0500   APPEARANCEUR CLEAR 06/23/2019 0500   LABSPEC 1.010 06/23/2019 0500   PHURINE 6.0 06/23/2019 0500   GLUCOSEU >=500 (A) 06/23/2019 0500   HGBUR SMALL (A) 06/23/2019 0500   BILIRUBINUR NEGATIVE 06/23/2019 0500   KETONESUR NEGATIVE 06/23/2019 0500   PROTEINUR >=300 (A) 06/23/2019 0500   UROBILINOGEN 0.2  06/11/2012 0518   NITRITE NEGATIVE 06/23/2019 0500   LEUKOCYTESUR NEGATIVE 06/23/2019 0500   No results found for: PHART, PCO2ART, PO2ART, HCO3, TCO2, ACIDBASEDEF, O2SAT     Component Value Date/Time   IRON 125 07/09/2019 1312   TIBC 260 07/09/2019 1312   FERRITIN 449 (H) 07/09/2019 1312   IRONPCTSAT 48 (H) 07/09/2019 1312       ASSESSMENT/PLAN:    Kelly Key an 48 y.o.malewitha PMH significant for dCHF, DM, HTN, HLD, morbid obesity, fatty liver disease, and CKD stage 4 followed by Dr. Lowanda Foster with nephrotic range proteinuria (documented in past as >11 grams/24 hours) who was recently hospitalized on 06/22/2019 [facility] with a 2 week history of worsening lower extremity edema and abdominal girth not responsive to increasing doses of torsemide as an outpatient. He has had accelerated HTN and workup for renal artery stenosis was underway with [name] however that has not been scheduled.During that hospitalization there were extensive discussions with the pt educating him about the need to initiate dialysis but he refused wanting to  give a trial with outpt diuretics. In the past Kelly Key hadexpressed that he would "rather die than go on dialysis" and is followed by Dr. Lowanda Foster, however on further discussion he always states that he's not ready to die. Patient went home with torsemide 60mg  BID and unfortunately did not respond even though he was compliant.   1.  End-stage renal disease. Status post [facility] 07/08/2019 and first HD.  3rd HD 9/15.  Discussed permanent access again with him today--> he still wants to hold off but it seems like he's more receptive to discussions.     2.  Hypertension.  Should improve with ultrafiltration.  On home dose of amlodipine.  3.  Acute on chronic diastolic congestive heart failure. Improving with dialysis.  4.  Malignant hypertension.  MRA pending of the renal arteries.  Will monitor blood pressure with dialysis.  Doubt intervention on any significant renal artery stenosis would make a difference in his renal function.    5.  Diabetes mellitus.  Overall poor control with a hemoglobin A1c of 12.4.  6.  Volume overload.  Should improve with ultrafiltration on dialysis.  7.  Anemia.  Is iron replete.  Hgb 10.1, will follow and dose Aranesp as appropriate- no ESA yet.    8.  BMD. Cont  phosphate binders.  9.  CIR in the works

## 2019-07-14 NOTE — Consult Note (Signed)
   [facility] CM Inpatient Consult   07/14/2019  Kelly Key September 15, 1971 [id number]   Follow up:  Patient hospitalized greater than 10 days.  Chart reviewed for disposition and currently being recommended for CIR.  Currently, closed from active/pending status with [facility] Care Management.  Please note that if patient disposition or needs changes please contact:  Natividad Brood, RN BSN [facility] Liaison  [phone] business mobile phone Toll free office [phone]  Fax number: [phone] [email or web address]

## 2019-07-14 NOTE — Progress Notes (Signed)
Physical Therapy Treatment Patient Details Name: Kelly Key MRN: [id number] DOB: [birth year] Today's Date: 07/14/2019    History of Present Illness Kelly Key is a 48 y.o. male with medical history significant of obesity (BMI 49); HTN; HLD: DM; stage 4 CKD; and chronic combined CHF (EF 50-55% in 2013) presenting with increased LE edema, decreased ability to ambulate.  He felt better when he left the hospital.  During the week, his legs got weak and he fell once.  He started developing worsening LE edema.  It has been particularly bad for the last 3 days.  He denies SOB.  His weight has "probably" changed.  He can't lie flat because his feet hurt too much and feel like they weigh too much.  No PND.  No fevers.    PT Comments    Pt very motivated to participate in therapy. He required min assist transfers and ambulation 10 feet x 2 with RW. Pt performed LE exercises seated and reclined in recliner. Pt without c/o dizziness this session. He relates his dizziness last session to having just finished HD. Current POC remains appropriate.  Pt now agreeable to CIR.   Follow Up Recommendations  CIR     Equipment Recommendations  Rolling walker with 5" wheels;3in1 (PT)    Recommendations for Other Services       Precautions / Restrictions Precautions Precautions: Fall    Mobility  Bed Mobility Overal bed mobility: Needs Assistance Bed Mobility: Supine to Sit     Supine to sit: Supervision;HOB elevated     General bed mobility comments: increased time and effort, +rail  Transfers Overall transfer level: Needs assistance Equipment used: Rolling walker (2 wheeled) Transfers: Sit to/from Stand Sit to Stand: Min assist         General transfer comment: increased time and effort to power up and stabilize standing balance  Ambulation/Gait Ambulation/Gait assistance: Min assist Gait Distance (Feet): 10 Feet(x 2) Assistive device: Rolling walker (2 wheeled) Gait  Pattern/deviations: Step-through pattern;Trunk flexed Gait velocity: decreased Gait velocity interpretation: <1.8 ft/sec, indicate of risk for recurrent falls General Gait Details: heavy reliance on RW; slow, guarded gait; 10' x 2 trials   Stairs             Wheelchair Mobility    Modified Rankin (Stroke Patients Only)       Balance Overall balance assessment: Needs assistance Sitting-balance support: Feet supported;No upper extremity supported Sitting balance-Leahy Scale: Fair     Standing balance support: During functional activity;Bilateral upper extremity supported Standing balance-Leahy Scale: Poor Standing balance comment: reliant on UE support                            Cognition Arousal/Alertness: Awake/alert Behavior During Therapy: WFL for tasks assessed/performed Overall Cognitive Status: Within Functional Limits for tasks assessed                                 General Comments: very motivated      Exercises General Exercises - Lower Extremity Ankle Circles/Pumps: AROM;Both;20 reps;Seated Long Arc Quad: AROM;Right;Left;10 reps;Seated Hip ABduction/ADduction: AROM;Right;Left;10 reps;Supine Hip Flexion/Marching: AROM;Right;Left;10 reps;Seated    General Comments        Pertinent Vitals/Pain Pain Assessment: Faces Faces Pain Scale: Hurts a little bit Pain Location: BLE Pain Descriptors / Indicators: Sore Pain Intervention(s): Monitored during session;Repositioned    Home Living  Prior Function            PT Goals (current goals can now be found in the care plan section) Acute Rehab PT Goals Patient Stated Goal: return home PT Goal Formulation: With patient Time For Goal Achievement: 07/22/19 Potential to Achieve Goals: Good Progress towards PT goals: Progressing toward goals    Frequency    Min 3X/week      PT Plan Current plan remains appropriate    Co-evaluation               AM-PAC PT "6 Clicks" Mobility   Outcome Measure  Help needed turning from your back to your side while in a flat bed without using bedrails?: None Help needed moving from lying on your back to sitting on the side of a flat bed without using bedrails?: A Little Help needed moving to and from a bed to a chair (including a wheelchair)?: A Little Help needed standing up from a chair using your arms (e.g., wheelchair or bedside chair)?: A Little Help needed to walk in hospital room?: A Lot Help needed climbing 3-5 steps with a railing? : Total 6 Click Score: 16    End of Session Equipment Utilized During Treatment: Gait belt Activity Tolerance: Patient tolerated treatment well Patient left: in chair;with call bell/phone within reach Nurse Communication: Mobility status PT Visit Diagnosis: Unsteadiness on feet (R26.81);Other abnormalities of gait and mobility (R26.89);Muscle weakness (generalized) (M62.81);History of falling (Z91.81)     Time: [id number] PT Time Calculation (min) (ACUTE ONLY): 26 min  Charges:  $Gait Training: 8-22 mins $Therapeutic Exercise: 8-22 mins                     Lorrin Goodell, PT  Office # [phone] Pager [phone]    Lorriane Shire 07/14/2019, 9:43 AM

## 2019-07-15 ENCOUNTER — Inpatient Hospital Stay (HOSPITAL_COMMUNITY): Admit: 2019-07-15 | Payer: Medicare Other

## 2019-07-15 LAB — CBC WITH DIFFERENTIAL/PLATELET
Abs Immature Granulocytes: 0.07 10*3/uL (ref 0.00–0.07)
Basophils Absolute: 0.1 10*3/uL (ref 0.0–0.1)
Basophils Relative: 1 %
Eosinophils Absolute: 0.6 10*3/uL — ABNORMAL HIGH (ref 0.0–0.5)
Eosinophils Relative: 7 %
HCT: 33.4 % — ABNORMAL LOW (ref 39.0–52.0)
Hemoglobin: 10.7 g/dL — ABNORMAL LOW (ref 13.0–17.0)
Immature Granulocytes: 1 %
Lymphocytes Relative: 31 %
Lymphs Abs: 3 10*3/uL (ref 0.7–4.0)
MCH: 24.7 pg — ABNORMAL LOW (ref 26.0–34.0)
MCHC: 32 g/dL (ref 30.0–36.0)
MCV: 77 fL — ABNORMAL LOW (ref 80.0–100.0)
Monocytes Absolute: 0.9 10*3/uL (ref 0.1–1.0)
Monocytes Relative: 9 %
Neutro Abs: 5 10*3/uL (ref 1.7–7.7)
Neutrophils Relative %: 51 %
Platelets: 500 10*3/uL — ABNORMAL HIGH (ref 150–400)
RBC: 4.34 MIL/uL (ref 4.22–5.81)
RDW: 15.4 % (ref 11.5–15.5)
WBC: 9.6 10*3/uL (ref 4.0–10.5)
nRBC: 0 % (ref 0.0–0.2)

## 2019-07-15 LAB — RENAL FUNCTION PANEL
Albumin: 2.5 g/dL — ABNORMAL LOW (ref 3.5–5.0)
Albumin: 2.5 g/dL — ABNORMAL LOW (ref 3.5–5.0)
Anion gap: 17 — ABNORMAL HIGH (ref 5–15)
Anion gap: 14 (ref 5–15)
BUN: 106 mg/dL — ABNORMAL HIGH (ref 6–20)
BUN: 46 mg/dL — ABNORMAL HIGH (ref 6–20)
CO2: 22 mmol/L (ref 22–32)
CO2: 23 mmol/L (ref 22–32)
Calcium: 9.1 mg/dL (ref 8.9–10.3)
Calcium: 8.7 mg/dL — ABNORMAL LOW (ref 8.9–10.3)
Chloride: 90 mmol/L — ABNORMAL LOW (ref 98–111)
Chloride: 97 mmol/L — ABNORMAL LOW (ref 98–111)
Creatinine, Ser: 8.17 mg/dL — ABNORMAL HIGH (ref 0.61–1.24)
Creatinine, Ser: 4.61 mg/dL — ABNORMAL HIGH (ref 0.61–1.24)
GFR calc Af Amer: 8 mL/min — ABNORMAL LOW (ref >60)
GFR calc Af Amer: 16 mL/min — ABNORMAL LOW (ref >60)
GFR calc non Af Amer: 7 mL/min — ABNORMAL LOW (ref >60)
GFR calc non Af Amer: 14 mL/min — ABNORMAL LOW (ref >60)
Glucose, Bld: 196 mg/dL — ABNORMAL HIGH (ref 70–99)
Glucose, Bld: 144 mg/dL — ABNORMAL HIGH (ref 70–99)
Phosphorus: 9.2 mg/dL — ABNORMAL HIGH (ref 2.5–4.6)
Phosphorus: 4.7 mg/dL — ABNORMAL HIGH (ref 2.5–4.6)
Potassium: 4.4 mmol/L (ref 3.5–5.1)
Potassium: 4 mmol/L (ref 3.5–5.1)
Sodium: 129 mmol/L — ABNORMAL LOW (ref 135–145)
Sodium: 134 mmol/L — ABNORMAL LOW (ref 135–145)

## 2019-07-15 LAB — GLUCOSE, CAPILLARY
Glucose-Capillary: 161 mg/dL — ABNORMAL HIGH (ref 70–99)
Glucose-Capillary: 182 mg/dL — ABNORMAL HIGH (ref 70–99)
Glucose-Capillary: 204 mg/dL — ABNORMAL HIGH (ref 70–99)
Glucose-Capillary: 207 mg/dL — ABNORMAL HIGH (ref 70–99)
Glucose-Capillary: 228 mg/dL — ABNORMAL HIGH (ref 70–99)

## 2019-07-15 MED ORDER — FUROSEMIDE 40 MG PO TABS
40.0000 mg | ORAL_TABLET | Freq: Two times a day (BID) | ORAL | Status: DC
  Administered 2019-07-15 – 2019-07-16 (×2): 40 mg via ORAL
  Filled 2019-07-15 (×2): qty 1

## 2019-07-15 MED ORDER — HEPARIN SODIUM (PORCINE) 1000 UNIT/ML IJ SOLN
INTRAMUSCULAR | Status: AC
  Filled 2019-07-15: qty 4

## 2019-07-15 MED ORDER — SODIUM CHLORIDE 0.9 % IV SOLN: 100.0000 mL | INTRAVENOUS | Status: DC | PRN

## 2019-07-15 MED ORDER — HEPARIN SODIUM (PORCINE) 1000 UNIT/ML DIALYSIS: 1000.0000 [IU] | INTRAMUSCULAR | Status: DC | PRN

## 2019-07-15 MED ORDER — ALTEPLASE 2 MG IJ SOLR: 2.0000 mg | Freq: Once | INTRAMUSCULAR | Status: DC | PRN

## 2019-07-15 NOTE — Discharge Instructions (Signed)
Nutrition and Hemodialysis Now that you are beginning hemodialysis, there may be many changes in your daily life. Your doctor has probably told you that you may need to make some changes in your diet. The renal dietitian at your dialysis center will help you plan a diet for your special needs.  Why do I need to be on a special diet? Because your kidneys are not able to get rid of enough waste products and fluids from your blood and your body now has special needs, you will need to limit fluids and change your intake of certain foods in your diet. How well you feel will depend on:  eating the right kind and amounts of food from your diet having the hemodialysis treatments your doctor orders for you taking the medications your doctor orders for you. Your diet is very important to your care. It is important that you have the right amount of protein, calories, fluids, vitamins and minerals each day. Your dietitian will help you plan your meals to make sure you get the proper balance. Some general guidelines to follow can be found in our free brochure, "Nutrition and Hemodialysis." You can obtain a copy by calling [phone].  How will I know if I am eating right to keep me healthy? Because you are on dialysis, you have some very special needs. Eating well helps you stay healthy. Eating poorly can increase your risk of illness. Your dietitian will talk with you about how well you are eating.  Some questions you might be asked:  Have you noticed a change in the kind or amount of food you eat each day? Have you had any problems eating your usual or recommended diet? Have you lost weight without trying? Have you noticed any changes in your strength or ability to take care of yourself? Your dietitian or nurse might look at the fat and muscle stores in your face, hands, arms, shoulders, and legs. Your dialysis care team will look for changes in your blood level of proteins, and especially one called  albumin. A change in this protein can mean that you are losing body protein.  Some special blood tests that are done each month are called Kt/V (pronounced kay tee over vee) or urea reduction ratio (URR). These tests help your doctor decide if you are getting enough dialysis. Getting the right amount of dialysis is important to help you feel your best.  A change in your fat and muscle stores or any of these blood tests could be a sign that you are not getting enough dialysis. Along with the Kt/V, these tests provide information about your intake of protein or your protein equivalent of nitrogen appearance (PNA). Using the PNA, your albumin and any changes in your appetite, your dietitian will determine if you are eating enough of the right foods. The right amount of dialysis is needed to make sure you are able to enjoy your food while keeping you healthy.  What if I have high cholesterol Changing your diet may help lower the cholesterol level in your blood. Your dietitian will talk with you about the kinds of fat and animal foods you eat. Also, your doctor may decide you need a special medication to reduce the cholesterol in your blood.  What if I have diabetes In some cases, you may need to make only a few changes in your diet to fit your needs as a kidney patient. For example, some of the free foods you have been using may  need to be limited on your kidney diet. Your dietitian will help develop a meal plan especially for you.  Is there anything else I should know The following important tips can be helpful with your diet:  Fresh or plain frozen vegetables contain no added salt. Drain all the cooking fluid before serving. Canned fruits usually contain less potassium than fresh fruits. Drain all the fluid before serving. Non-dairy creamers are low in phosphorus and can be used in place of milk. Labels on food packages will give you information about some of the ingredients that may not be allowed  in your diet. Learn to read these labels. To help you avoid salt, many herbs and spices can be used to make your diet more interesting. Check with your dietitian for a list of these. If you would like more information, please contact us.   2015 [employer]. All rights reserved. This material does not constitute medical advice. It is intended for informational purposes only. Please consult a physician for specific treatment recommendations.

## 2019-07-15 NOTE — Progress Notes (Signed)
[facility]    NEPHROLOGY PROGRESS NOTE  SUBJECTIVE: Had HD last night into the AM, is sleepy this AM    OBJECTIVE:  Vitals:   07/15/19 0530 07/15/19 1034  BP: (!) 142/91 (!) 148/109  Pulse: 96 99  Resp: 18 16  Temp: 98.5 F (36.9 C) 97.6 F (36.4 C)  SpO2: 99% 98%    Intake/Output Summary (Last 24 hours) at 07/15/2019 1345 Last data filed at 07/15/2019 1200 Gross per 24 hour  Intake 1343 ml  Output 3600 ml  Net -2257 ml      General:  AAOx3 NAD, lying flat in bed Neck:  No JVD CV:  Heart RRR  Lungs: clear anteriorly Abd:  abd SNT/ND with normal BS Extremities: +1 bilateral lower extremity edema. ACCESS: R IJ TDC   MEDICATIONS:  . allopurinol  200 mg Oral Daily  . amLODipine  10 mg Oral Daily  . aspirin EC  81 mg Oral Daily  . atorvastatin  80 mg Oral Daily  . carvedilol  25 mg Oral BID WC  . Chlorhexidine Gluconate Cloth  6 each Topical Q0600  . cloNIDine  0.3 mg Oral TID  . ezetimibe  10 mg Oral QPM  . fluticasone  1 spray Each Nare Daily  . furosemide  80 mg Intravenous BID  . gabapentin  100 mg Oral TID  . heparin      . heparin  5,000 Units Subcutaneous Q8H  . insulin aspart  0-20 Units Subcutaneous TID WC  . insulin aspart  0-5 Units Subcutaneous QHS  . insulin detemir  35 Units Subcutaneous BID  . linaclotide  145 mcg Oral QAC breakfast  . potassium chloride  20 mEq Oral Once  . sevelamer carbonate  800 mg Oral TID WC  . sodium chloride flush  3 mL Intravenous Q12H  . traMADol  50 mg Oral Once       LABS:   CBC Latest Ref Rng & Units 07/15/2019 07/12/2019 07/10/2019  WBC 4.0 - 10.5 K/uL 9.6 8.8 8.3  Hemoglobin 13.0 - 17.0 g/dL 10.7(L) 10.1(L) 9.7(L)  Hematocrit 39.0 - 52.0 % 33.4(L) 31.5(L) 30.2(L)  Platelets 150 - 400 K/uL 500(H) 489(H) 481(H)    CMP Latest Ref Rng & Units 07/15/2019 07/15/2019 07/14/2019  Glucose 70 - 99 mg/dL 144(H) 196(H) 150(H)  BUN 6 - 20 mg/dL 46(H) 106(H) 89(H)  Creatinine 0.61 - 1.24 mg/dL 4.61(H)  8.17(H) 7.77(H)  Sodium 135 - 145 mmol/L 134(L) 129(L) 131(L)  Potassium 3.5 - 5.1 mmol/L 4.0 4.4 4.4  Chloride 98 - 111 mmol/L 97(L) 90(L) 91(L)  CO2 22 - 32 mmol/L 23 22 22   Calcium 8.9 - 10.3 mg/dL 8.7(L) 9.1 8.9  Total Protein 6.5 - 8.1 g/dL - - -  Total Bilirubin 0.3 - 1.2 mg/dL - - -  Alkaline Phos 38 - 126 U/L - - -  AST 15 - 41 U/L - - -  ALT 0 - 44 U/L - - -    Lab Results  Component Value Date   CALCIUM 8.7 (L) 07/15/2019   PHOS 4.7 (H) 07/15/2019       Component Value Date/Time   COLORURINE STRAW (A) 06/23/2019 0500   APPEARANCEUR CLEAR 06/23/2019 0500   LABSPEC 1.010 06/23/2019 0500   PHURINE 6.0 06/23/2019 0500   GLUCOSEU >=500 (A) 06/23/2019 0500   HGBUR SMALL (A) 06/23/2019 0500   BILIRUBINUR NEGATIVE 06/23/2019 0500   KETONESUR NEGATIVE 06/23/2019 0500   PROTEINUR >=300 (A) 06/23/2019 0500   UROBILINOGEN  0.2 06/11/2012 0518   NITRITE NEGATIVE 06/23/2019 0500   LEUKOCYTESUR NEGATIVE 06/23/2019 0500   No results found for: PHART, PCO2ART, PO2ART, HCO3, TCO2, ACIDBASEDEF, O2SAT     Component Value Date/Time   IRON 125 07/09/2019 1312   TIBC 260 07/09/2019 1312   FERRITIN 449 (H) 07/09/2019 1312   IRONPCTSAT 48 (H) 07/09/2019 1312       ASSESSMENT/PLAN:    Kelly Key Asheis an 48 y.o.malewitha PMH significant for dCHF, DM, HTN, HLD, morbid obesity, fatty liver disease, and CKD stage 4 followed by Dr. Lowanda Foster with nephrotic range proteinuria (documented in past as >11 grams/24 hours) who was recently hospitalized on 06/22/2019 [facility] with a 2 week history of worsening lower extremity edema and abdominal girth not responsive to increasing doses of torsemide as an outpatient. He has had accelerated HTN and workup for renal artery stenosis was underway with [name] however that has not been scheduled.During that hospitalization there were extensive discussions with the pt educating him about the need to initiate dialysis but he refused wanting to give a  trial with outpt diuretics. In the past Mr. Chamberlin hadexpressed that he would "rather die than go on dialysis" and is followed by Dr. Lowanda Foster, however on further discussion he always states that he's not ready to die. Patient went home with torsemide 60mg  BID and unfortunately did not respond even though he was compliant.   1.  End-stage renal disease. Status post [facility] 07/08/2019 and first HD.  3rd HD 9/15, now on TTS.  Discussed permanent access again with him today--> he still wants to hold off but it seems like he's more receptive to discussions.     2.  Hypertension.  Should improve with ultrafiltration.  On home dose of amlodipine.  3.  Acute on chronic diastolic congestive heart failure. Improving with dialysis.  4.  Malignant hypertension.  Will monitor blood pressure with dialysis.  Doubt intervention on any significant renal artery stenosis would make a difference in his renal function.    5.  Diabetes mellitus.  Overall poor control with a hemoglobin A1c of 12.4.  6.  Volume overload.  Should improve with ultrafiltration on dialysis.  7.  Anemia.  Is iron replete.  Hgb 10.1, will follow and dose Aranesp as appropriate- no ESA yet.    8.  BMD. Cont  phosphate binders.  9.  To CIR, CLIP

## 2019-07-15 NOTE — Progress Notes (Signed)
PROGRESS NOTE    Kelly Key  [id number] DOB: [birth year] DOA: 07/03/2019 PCP: [name], MD   Brief Narrative:  48 y.o.malewith medical history significant ofobesity (BMI 49); HTN; HLD: DM; stage 4 CKD; and chronic combined CHF (EF 50-55% in 2013) presenting with increased LE edema, decreased ambulation - readmitted on 07/03/2019 with anasarca in the setting of dCHF exacerbation- -transfered to [place] for tunneled HD catheter placement and initiation of hemodialysis. Patient admitted as above with worsening kidney function in the setting of heart failure exacerbation and volume overload.  Patient now tolerating dialysis quite well, continues to diurese somewhat well but in general clinically improving.  Currently awaiting further evaluation with nephrology for ongoing dialysis and outpatient dialysis slot.     Assessment & Plan:   Principal Problem:   Anasarca Active Problems:   HTN (hypertension)   Morbid obesity (HCC)   CKD (chronic kidney disease) stage 5, GFR less than 15 ml/min (HCC)   Uncontrolled type 2 diabetes mellitus with hyperglycemia, with long-term current use of insulin (HCC)   Dyslipidemia   Chronic combined systolic and diastolic CHF (congestive heart failure) (HCC)   Volume overload   Congestive heart failure (HCC)   Anasarca - Multifactorial in the setting of CHF with preserved ejection fraction and worsening renal function - Previously on torsemide 60 mg twice daily at home with poor response.  On IV Lasix 80 mg here.  We will transition to p.o. in anticipation for discharge to CIR - Right IJ tunnel catheter placed by IR 9/11 -Echocardiogram 06/23/2019-ejection fraction 50-55%  Ambulatory dysfunction - Generalized deconditioning with fluid overload making it difficult.  PT recommended CIR, should have a bed tomorrow  ESRD on hemodialysis -Renal ultrasound-negative for obstruction. -Nephrology following, tolerating dialysis - Clipped for  outpatient dialysis TTS  Essential hypertension - Plan for evaluation for outpatient renal artery stenosis.  This can be followed up by [name] -Continue Coreg, clonidine and Norvasc  Anemia of chronic disease secondary to renal dysfunction Iron deficiency anemia -Status post IV iron.  Hemoglobin stable.  Diabetes mellitus type 2 -Sliding scale Accu-Cheks.  Resume Levemir  DVT prophylaxis: Heparin Code Status: Full code Family Communication: None Disposition Plan: Transfer to CIR, hopefully tomorrow  Consultants:   Nephrology   Subjective: Feels okay.  Motivated to get stronger  Review of Systems Otherwise negative except as per HPI, including: General = no fevers, chills, dizziness, malaise, fatigue HEENT/EYES = negative for pain, redness, loss of vision, double vision, blurred vision, loss of hearing, sore throat, hoarseness, dysphagia Cardiovascular= negative for chest pain, palpitation, murmurs, lower extremity swelling Respiratory/lungs= negative for shortness of breath, cough, hemoptysis, wheezing, mucus production Gastrointestinal= negative for nausea, vomiting,, abdominal pain, melena, hematemesis Genitourinary= negative for Dysuria, Hematuria, Change in Urinary Frequency MSK = Negative for arthralgia, myalgias, Back Pain, Joint swelling  Neurology= Negative for headache, seizures, numbness, tingling  Psychiatry= Negative for anxiety, depression, suicidal and homocidal ideation Allergy/Immunology= Medication/Food allergy as listed  Skin= Negative for Rash, lesions, ulcers, itching   Objective: Vitals:   07/15/19 0430 07/15/19 0500 07/15/19 0530 07/15/19 1034  BP: 134/90 (!) 166/95 (!) 142/91 (!) 148/109  Pulse: 92 95 96 99  Resp:  16 18 16   Temp:  98.2 F (36.8 C) 98.5 F (36.9 C) 97.6 F (36.4 C)  TempSrc:  Oral Oral Oral  SpO2:  98% 99% 98%  Weight:  132 kg    Height:        Intake/Output Summary (Last 24  hours) at 07/15/2019 1429 Last data filed  at 07/15/2019 1200 Gross per 24 hour  Intake 1343 ml  Output 3225 ml  Net -1882 ml   Filed Weights   07/13/19 0500 07/15/19 0045 07/15/19 0500  Weight: 132.9 kg 134.1 kg 132 kg    Examination:  Constitutional: NAD, calm, comfortable Eyes: PERRL, lids and conjunctivae normal ENMT: Mucous membranes are moist. Posterior pharynx clear of any exudate or lesions.Normal dentition.  Neck: normal, supple, no masses, no thyromegaly Respiratory: clear to auscultation bilaterally, no wheezing, no crackles. Normal respiratory effort. No accessory muscle use.  Cardiovascular: Regular rate and rhythm, no murmurs / rubs / gallops.  1+ bilateral lower extremity pitting edema. 2+ pedal pulses. No carotid bruits.  Abdomen: no tenderness, no masses palpated. No hepatosplenomegaly. Bowel sounds positive.  Musculoskeletal: no clubbing / cyanosis. No joint deformity upper and lower extremities. Good ROM, no contractures. Normal muscle tone.  Skin: no rashes, lesions, ulcers. No induration Neurologic: CN 2-12 grossly intact. Sensation intact, DTR normal. Strength 5/5 in all 4.  Psychiatric: Normal judgment and insight. Alert and oriented x 3. Normal mood.    Right chest wall dialysis catheter now  Data Reviewed:   CBC: Recent Labs  Lab 07/09/19 0536 07/10/19 0412 07/12/19 0759 07/15/19 0200  WBC 8.1 8.3 8.8 9.6  NEUTROABS  --   --   --  5.0  HGB 9.7* 9.7* 10.1* 10.7*  HCT 30.7* 30.2* 31.5* 33.4*  MCV 76.2* 76.1* 77.4* 77.0*  PLT 429* 481* 489* [id number]*   Basic Metabolic Panel: Recent Labs  Lab 07/12/19 0510 07/13/19 0456 07/14/19 0400 07/15/19 0201 07/15/19 0555  NA 134* 133* 131* 129* 134*  K 3.7 4.3 4.4 4.4 4.0  CL 92* 95* 91* 90* 97*  CO2 23 23 22 22 23   GLUCOSE 170* 147* 150* 196* 144*  BUN 92* 62* 89* 106* 46*  CREATININE 6.64* 6.10* 7.77* 8.17* 4.61*  CALCIUM 8.9 8.7* 8.9 9.1 8.7*  PHOS 8.7* 7.6* 9.0* 9.2* 4.7*   GFR: Estimated Creatinine Clearance: 25.1 mL/min (A) (by C-G  formula based on SCr of 4.61 mg/dL (H)). Liver Function Tests: Recent Labs  Lab 07/12/19 0510 07/13/19 0456 07/14/19 0400 07/15/19 0201 07/15/19 0555  ALBUMIN 2.2* 2.3* 2.4* 2.5* 2.5*   No results for input(s): LIPASE, AMYLASE in the last 168 hours. No results for input(s): AMMONIA in the last 168 hours. Coagulation Profile: No results for input(s): INR, PROTIME in the last 168 hours. Cardiac Enzymes: No results for input(s): CKTOTAL, CKMB, CKMBINDEX, TROPONINI in the last 168 hours. BNP (last 3 results) No results for input(s): PROBNP in the last 8760 hours. HbA1C: No results for input(s): HGBA1C in the last 72 hours. CBG: Recent Labs  Lab 07/14/19 1618 07/14/19 2201 07/15/19 0032 07/15/19 0722 07/15/19 1132  GLUCAP 138* 219* 207* 161* 182*   Lipid Profile: No results for input(s): CHOL, HDL, LDLCALC, TRIG, CHOLHDL, LDLDIRECT in the last 72 hours. Thyroid Function Tests: No results for input(s): TSH, T4TOTAL, FREET4, T3FREE, THYROIDAB in the last 72 hours. Anemia Panel: No results for input(s): VITAMINB12, FOLATE, FERRITIN, TIBC, IRON, RETICCTPCT in the last 72 hours. Sepsis Labs: No results for input(s): PROCALCITON, LATICACIDVEN in the last 168 hours.  Recent Results (from the past 240 hour(s))  Surgical PCR screen     Status: Abnormal   Collection Time: 07/06/19 12:57 AM   Specimen: Nasal Mucosa; Nasal Swab  Result Value Ref Range Status   MRSA, PCR POSITIVE (A) NEGATIVE Final   Staphylococcus  aureus POSITIVE (A) NEGATIVE Final    Comment: RESULT CALLED TO, READ BACK BY AND VERIFIED WITH: J RHUE,RN @0419  07/06/19 MKELLY (NOTE) The Xpert SA Assay (FDA approved for NASAL specimens in patients 30 years of age and older), is one component of a comprehensive surveillance program. It is not intended to diagnose infection nor to guide or monitor treatment. Performed at [facility], [address], [place], [place] [address]          Radiology Studies: No  results found.      Scheduled Meds: . allopurinol  200 mg Oral Daily  . amLODipine  10 mg Oral Daily  . aspirin EC  81 mg Oral Daily  . atorvastatin  80 mg Oral Daily  . carvedilol  25 mg Oral BID WC  . Chlorhexidine Gluconate Cloth  6 each Topical Q0600  . cloNIDine  0.3 mg Oral TID  . ezetimibe  10 mg Oral QPM  . fluticasone  1 spray Each Nare Daily  . furosemide  40 mg Oral BID  . gabapentin  100 mg Oral TID  . heparin  5,000 Units Subcutaneous Q8H  . insulin aspart  0-20 Units Subcutaneous TID WC  . insulin aspart  0-5 Units Subcutaneous QHS  . insulin detemir  35 Units Subcutaneous BID  . linaclotide  145 mcg Oral QAC breakfast  . potassium chloride  20 mEq Oral Once  . sevelamer carbonate  800 mg Oral TID WC  . sodium chloride flush  3 mL Intravenous Q12H  . traMADol  50 mg Oral Once   Continuous Infusions: . sodium chloride       LOS: 10 days   Time spent= 25 mins    Kennedy Brines Arsenio Loader, MD [facility]  If 7PM-7AM, please contact night-coverage [email or web address] 07/15/2019, 2:29 PM

## 2019-07-15 NOTE — Progress Notes (Signed)
Nutrition Education Note Follow-Up Note  RD re-consulted for diet education on renal diet.   RD attempted to speak with pt via phone, however, no answer. Spoke with RD who previously saw pt, who reports that pt retained diet education well and was provided renal food guide pyramid handout.   RD attached "[place] For Patient with Kidney Disease" handout to AVS/ discharge summary.   Per chart review, pt will likely be discharging to CIR- RD team and provide additional support to pt at CIR as needed. Pt will also have access to renal RD at outpatient RD center.   No further nutrition interventions warranted at this time. RD contact information provided. If additional nutrition issues arise, please re-consult RD.   Kelly Key A. Jimmye Norman, RD, LDN, [place] Registered Dietitian II Certified Diabetes Care and Education Specialist Pager: [phone] After hours Pager: [phone]

## 2019-07-15 NOTE — TOC Progression Note (Signed)
Transition of Care [facility]) - Progression Note    Patient Details  Name: Kelly Key MRN: [id number] Date of Birth: 1971/04/10  Transition of Care [facility]) CM/SW Contact  Bartholomew Crews, RN Phone Number: [phone] 07/15/2019, 2:25 PM  Clinical Narrative:    Patient to transition to CIR tomorrow morning. HD to be done after patient is admitted to CIR. Patient has personal rollator at bedside that needs to go with him.   Expected Discharge Plan: [place] Barriers to Discharge: Continued Medical Work up  Expected Discharge Plan and Services Expected Discharge Plan: [place] arrangements for the past 2 months: Single Family Home Expected Discharge Date: 07/15/19                                     Social Determinants of Health (SDOH) Interventions    Readmission Risk Interventions Readmission Risk Prevention Plan 06/27/2019 06/23/2019 02/21/2019  Transportation Screening - Complete Complete  PCP or Specialist Appt within 3-5 Days Complete - Complete  HRI or [place] - Complete Not Complete  HRI or Home Care Consult comments - - not needed, independent, active with [facility]  Social Work Consult for [place] Planning/Counseling - Complete Complete  Palliative Care Screening - Not Applicable Not Applicable  Medication Review Press photographer) - Complete Complete  Some recent data might be hidden

## 2019-07-15 NOTE — Progress Notes (Signed)
Inpatient Rehab Admissions Coordinator:   I have approval from Dr. Reesa Chew for pt to admit to CIR tomorrow (Saturday 9/18).  Plan to transition pt to PO lasix today.  Dr. Posey Pronto with assess pt Saturday morning to ensure medical stability prior to admission to CIR.  Floor RN can call [phone] (rehab nursing station) for report by noon on Saturday.  I will let pt/family, and CM know.   Shann Medal, PT, DPT Admissions Coordinator [phone] 07/15/19  2:09 PM

## 2019-07-16 ENCOUNTER — Encounter (HOSPITAL_COMMUNITY): Payer: Self-pay | Admitting: *Deleted

## 2019-07-16 ENCOUNTER — Inpatient Hospital Stay (HOSPITAL_COMMUNITY)
Admission: RE | Admit: 2019-07-16 | Discharge: 2019-08-03 | DRG: 945 | Disposition: A | Discharge status: Home Health | Payer: Medicare Other | Source: Intra-hospital | Attending: Physical Medicine & Rehabilitation | Admitting: Physical Medicine & Rehabilitation

## 2019-07-16 DIAGNOSIS — Z79899 Other long term (current) drug therapy: Secondary | ICD-10-CM | POA: Diagnosis not present

## 2019-07-16 DIAGNOSIS — I5033 Acute on chronic diastolic (congestive) heart failure: Secondary | ICD-10-CM | POA: Diagnosis present

## 2019-07-16 DIAGNOSIS — S66911D Strain of unspecified muscle, fascia and tendon at wrist and hand level, right hand, subsequent encounter: Secondary | ICD-10-CM | POA: Diagnosis not present

## 2019-07-16 DIAGNOSIS — T380X5A Adverse effect of glucocorticoids and synthetic analogues, initial encounter: Secondary | ICD-10-CM | POA: Diagnosis not present

## 2019-07-16 DIAGNOSIS — Z800 Family history of malignant neoplasm of digestive organs: Secondary | ICD-10-CM | POA: Diagnosis not present

## 2019-07-16 DIAGNOSIS — Z6841 Body Mass Index (BMI) 40.0 and over, adult: Secondary | ICD-10-CM

## 2019-07-16 DIAGNOSIS — E11649 Type 2 diabetes mellitus with hypoglycemia without coma: Secondary | ICD-10-CM | POA: Diagnosis present

## 2019-07-16 DIAGNOSIS — W19XXXD Unspecified fall, subsequent encounter: Secondary | ICD-10-CM | POA: Diagnosis present

## 2019-07-16 DIAGNOSIS — R5381 Other malaise: Principal | ICD-10-CM | POA: Diagnosis present

## 2019-07-16 DIAGNOSIS — I951 Orthostatic hypotension: Secondary | ICD-10-CM

## 2019-07-16 DIAGNOSIS — D72829 Elevated white blood cell count, unspecified: Secondary | ICD-10-CM | POA: Diagnosis not present

## 2019-07-16 DIAGNOSIS — Z888 Allergy status to other drugs, medicaments and biological substances status: Secondary | ICD-10-CM

## 2019-07-16 DIAGNOSIS — M7989 Other specified soft tissue disorders: Secondary | ICD-10-CM | POA: Diagnosis not present

## 2019-07-16 DIAGNOSIS — Y92239 Unspecified place in hospital as the place of occurrence of the external cause: Secondary | ICD-10-CM | POA: Diagnosis not present

## 2019-07-16 DIAGNOSIS — Z8249 Family history of ischemic heart disease and other diseases of the circulatory system: Secondary | ICD-10-CM

## 2019-07-16 DIAGNOSIS — N178 Other acute kidney failure: Secondary | ICD-10-CM | POA: Diagnosis not present

## 2019-07-16 DIAGNOSIS — R52 Pain, unspecified: Secondary | ICD-10-CM | POA: Diagnosis present

## 2019-07-16 DIAGNOSIS — I130 Hypertensive heart and chronic kidney disease with heart failure and stage 1 through stage 4 chronic kidney disease, or unspecified chronic kidney disease: Secondary | ICD-10-CM | POA: Diagnosis not present

## 2019-07-16 DIAGNOSIS — I150 Renovascular hypertension: Secondary | ICD-10-CM | POA: Diagnosis not present

## 2019-07-16 DIAGNOSIS — N186 End stage renal disease: Secondary | ICD-10-CM

## 2019-07-16 DIAGNOSIS — M109 Gout, unspecified: Secondary | ICD-10-CM

## 2019-07-16 DIAGNOSIS — M25531 Pain in right wrist: Secondary | ICD-10-CM | POA: Diagnosis not present

## 2019-07-16 DIAGNOSIS — E1165 Type 2 diabetes mellitus with hyperglycemia: Secondary | ICD-10-CM | POA: Diagnosis present

## 2019-07-16 DIAGNOSIS — E1122 Type 2 diabetes mellitus with diabetic chronic kidney disease: Secondary | ICD-10-CM | POA: Diagnosis present

## 2019-07-16 DIAGNOSIS — I10 Essential (primary) hypertension: Secondary | ICD-10-CM

## 2019-07-16 DIAGNOSIS — S66811D Strain of other specified muscles, fascia and tendons at wrist and hand level, right hand, subsequent encounter: Secondary | ICD-10-CM

## 2019-07-16 DIAGNOSIS — R7309 Other abnormal glucose: Secondary | ICD-10-CM

## 2019-07-16 DIAGNOSIS — Z833 Family history of diabetes mellitus: Secondary | ICD-10-CM

## 2019-07-16 DIAGNOSIS — D638 Anemia in other chronic diseases classified elsewhere: Secondary | ICD-10-CM | POA: Diagnosis not present

## 2019-07-16 DIAGNOSIS — E876 Hypokalemia: Secondary | ICD-10-CM | POA: Diagnosis not present

## 2019-07-16 DIAGNOSIS — E785 Hyperlipidemia, unspecified: Secondary | ICD-10-CM | POA: Diagnosis present

## 2019-07-16 DIAGNOSIS — K760 Fatty (change of) liver, not elsewhere classified: Secondary | ICD-10-CM | POA: Diagnosis present

## 2019-07-16 DIAGNOSIS — S66911A Strain of unspecified muscle, fascia and tendon at wrist and hand level, right hand, initial encounter: Secondary | ICD-10-CM

## 2019-07-16 DIAGNOSIS — N179 Acute kidney failure, unspecified: Secondary | ICD-10-CM | POA: Diagnosis not present

## 2019-07-16 DIAGNOSIS — F419 Anxiety disorder, unspecified: Secondary | ICD-10-CM | POA: Diagnosis present

## 2019-07-16 DIAGNOSIS — N184 Chronic kidney disease, stage 4 (severe): Secondary | ICD-10-CM | POA: Diagnosis not present

## 2019-07-16 DIAGNOSIS — D631 Anemia in chronic kidney disease: Secondary | ICD-10-CM | POA: Diagnosis not present

## 2019-07-16 DIAGNOSIS — Z794 Long term (current) use of insulin: Secondary | ICD-10-CM | POA: Diagnosis not present

## 2019-07-16 DIAGNOSIS — Z825 Family history of asthma and other chronic lower respiratory diseases: Secondary | ICD-10-CM | POA: Diagnosis not present

## 2019-07-16 DIAGNOSIS — I132 Hypertensive heart and chronic kidney disease with heart failure and with stage 5 chronic kidney disease, or end stage renal disease: Secondary | ICD-10-CM | POA: Diagnosis present

## 2019-07-16 DIAGNOSIS — Z992 Dependence on renal dialysis: Secondary | ICD-10-CM | POA: Diagnosis not present

## 2019-07-16 DIAGNOSIS — E1140 Type 2 diabetes mellitus with diabetic neuropathy, unspecified: Secondary | ICD-10-CM | POA: Diagnosis present

## 2019-07-16 DIAGNOSIS — I5032 Chronic diastolic (congestive) heart failure: Secondary | ICD-10-CM | POA: Diagnosis not present

## 2019-07-16 DIAGNOSIS — I5042 Chronic combined systolic (congestive) and diastolic (congestive) heart failure: Secondary | ICD-10-CM

## 2019-07-16 LAB — CBC
HCT: 32.5 % — ABNORMAL LOW (ref 39.0–52.0)
Hemoglobin: 10.1 g/dL — ABNORMAL LOW (ref 13.0–17.0)
MCH: 24.4 pg — ABNORMAL LOW (ref 26.0–34.0)
MCHC: 31.1 g/dL (ref 30.0–36.0)
MCV: 78.5 fL — ABNORMAL LOW (ref 80.0–100.0)
Platelets: 407 10*3/uL — ABNORMAL HIGH (ref 150–400)
RBC: 4.14 MIL/uL — ABNORMAL LOW (ref 4.22–5.81)
RDW: 15.6 % — ABNORMAL HIGH (ref 11.5–15.5)
WBC: 8.6 10*3/uL (ref 4.0–10.5)
nRBC: 0 % (ref 0.0–0.2)

## 2019-07-16 LAB — GLUCOSE, CAPILLARY
Glucose-Capillary: 126 mg/dL — ABNORMAL HIGH (ref 70–99)
Glucose-Capillary: 168 mg/dL — ABNORMAL HIGH (ref 70–99)
Glucose-Capillary: 233 mg/dL — ABNORMAL HIGH (ref 70–99)
Glucose-Capillary: 204 mg/dL — ABNORMAL HIGH (ref 70–99)

## 2019-07-16 MED ORDER — CLONIDINE HCL 0.2 MG PO TABS
0.3000 mg | ORAL_TABLET | Freq: Three times a day (TID) | ORAL | Status: DC
  Administered 2019-07-16 – 2019-07-22 (×12): 0.3 mg via ORAL
  Filled 2019-07-16 (×16): qty 1

## 2019-07-16 MED ORDER — HEPARIN SODIUM (PORCINE) 1000 UNIT/ML DIALYSIS
1000.0000 [IU] | INTRAMUSCULAR | Status: DC | PRN
  Filled 2019-07-16: qty 1

## 2019-07-16 MED ORDER — HYDROCODONE-ACETAMINOPHEN 5-325 MG PO TABS
1.0000 | ORAL_TABLET | Freq: Four times a day (QID) | ORAL | Status: DC | PRN
  Administered 2019-07-21: 2 via ORAL
  Administered 2019-07-21: 07:00:00 1 via ORAL
  Administered 2019-07-22 – 2019-08-01 (×7): 2 via ORAL
  Administered 2019-08-01: 1 via ORAL
  Administered 2019-08-02: 2 via ORAL
  Administered 2019-08-02: 1 via ORAL
  Filled 2019-07-16 (×2): qty 2
  Filled 2019-07-16: qty 1
  Filled 2019-07-16 (×4): qty 2
  Filled 2019-07-16: qty 1
  Filled 2019-07-16 (×5): qty 2

## 2019-07-16 MED ORDER — MILK AND MOLASSES ENEMA
1.0000 | Freq: Every day | RECTAL | Status: DC | PRN
  Filled 2019-07-16: qty 240

## 2019-07-16 MED ORDER — HYDROXYZINE HCL 25 MG PO TABS: 25.0000 mg | ORAL_TABLET | Freq: Three times a day (TID) | ORAL | Status: DC | PRN

## 2019-07-16 MED ORDER — LINACLOTIDE 145 MCG PO CAPS
145.0000 ug | ORAL_CAPSULE | Freq: Every day | ORAL | Status: DC
  Administered 2019-07-20 – 2019-08-02 (×13): 145 ug via ORAL
  Filled 2019-07-16 (×20): qty 1

## 2019-07-16 MED ORDER — EZETIMIBE 10 MG PO TABS
10.0000 mg | ORAL_TABLET | Freq: Every evening | ORAL | Status: DC
  Administered 2019-07-16 – 2019-08-01 (×14): 10 mg via ORAL
  Filled 2019-07-16 (×19): qty 1

## 2019-07-16 MED ORDER — ACETAMINOPHEN 325 MG PO TABS
325.0000 mg | ORAL_TABLET | ORAL | Status: DC | PRN
  Administered 2019-07-24 (×2): 650 mg via ORAL
  Filled 2019-07-16 (×2): qty 2

## 2019-07-16 MED ORDER — HEPARIN SODIUM (PORCINE) 5000 UNIT/ML IJ SOLN
5000.0000 [IU] | Freq: Three times a day (TID) | INTRAMUSCULAR | Status: DC
  Administered 2019-07-16 – 2019-08-03 (×50): 5000 [IU] via SUBCUTANEOUS
  Filled 2019-07-16 (×50): qty 1

## 2019-07-16 MED ORDER — PROCHLORPERAZINE EDISYLATE 10 MG/2ML IJ SOLN: 5.0000 mg | Freq: Four times a day (QID) | INTRAMUSCULAR | Status: DC | PRN

## 2019-07-16 MED ORDER — SEVELAMER CARBONATE 800 MG PO TABS: 800.0000 mg | ORAL_TABLET | Freq: Three times a day (TID) | ORAL | Status: DC

## 2019-07-16 MED ORDER — ATORVASTATIN CALCIUM 80 MG PO TABS
80.0000 mg | ORAL_TABLET | Freq: Every day | ORAL | Status: DC
  Administered 2019-07-17 – 2019-08-03 (×18): 80 mg via ORAL
  Filled 2019-07-16 (×19): qty 1

## 2019-07-16 MED ORDER — ASPIRIN EC 81 MG PO TBEC
81.0000 mg | DELAYED_RELEASE_TABLET | Freq: Every day | ORAL | Status: DC
  Administered 2019-07-17 – 2019-08-03 (×18): 81 mg via ORAL
  Filled 2019-07-16 (×18): qty 1

## 2019-07-16 MED ORDER — PENTAFLUOROPROP-TETRAFLUOROETH EX AERO: 1.0000 "application " | INHALATION_SPRAY | CUTANEOUS | Status: DC | PRN

## 2019-07-16 MED ORDER — NEPRO/CARBSTEADY PO LIQD: 237.0000 mL | Freq: Three times a day (TID) | ORAL | Status: DC | PRN

## 2019-07-16 MED ORDER — INSULIN ASPART 100 UNIT/ML ~~LOC~~ SOLN
0.0000 [IU] | Freq: Every day | SUBCUTANEOUS | Status: DC
  Administered 2019-07-16 – 2019-07-23 (×3): 2 [IU] via SUBCUTANEOUS
  Administered 2019-07-25: 4 [IU] via SUBCUTANEOUS
  Administered 2019-07-27: 5 [IU] via SUBCUTANEOUS
  Administered 2019-07-28: 3 [IU] via SUBCUTANEOUS
  Administered 2019-08-02: 2 [IU] via SUBCUTANEOUS

## 2019-07-16 MED ORDER — HEPARIN SODIUM (PORCINE) 1000 UNIT/ML DIALYSIS
1000.0000 [IU] | INTRAMUSCULAR | Status: DC | PRN
  Administered 2019-07-18: 02:00:00 3800 [IU] via INTRAVENOUS_CENTRAL
  Filled 2019-07-16 (×2): qty 1

## 2019-07-16 MED ORDER — PROCHLORPERAZINE MALEATE 5 MG PO TABS
5.0000 mg | ORAL_TABLET | Freq: Four times a day (QID) | ORAL | Status: DC | PRN
  Administered 2019-07-22: 10 mg via ORAL
  Filled 2019-07-16: qty 2

## 2019-07-16 MED ORDER — GUAIFENESIN-DM 100-10 MG/5ML PO SYRP: 5.0000 mL | ORAL_SOLUTION | Freq: Four times a day (QID) | ORAL | Status: DC | PRN

## 2019-07-16 MED ORDER — SODIUM CHLORIDE 0.9 % IV SOLN: 100.0000 mL | INTRAVENOUS | Status: DC | PRN

## 2019-07-16 MED ORDER — ALTEPLASE 2 MG IJ SOLR: 2.0000 mg | Freq: Once | INTRAMUSCULAR | Status: DC | PRN

## 2019-07-16 MED ORDER — BISACODYL 10 MG RE SUPP: 10.0000 mg | Freq: Every day | RECTAL | Status: DC | PRN

## 2019-07-16 MED ORDER — CARVEDILOL 25 MG PO TABS
25.0000 mg | ORAL_TABLET | Freq: Two times a day (BID) | ORAL | Status: DC
  Administered 2019-07-16 – 2019-07-20 (×5): 25 mg via ORAL
  Filled 2019-07-16 (×7): qty 1

## 2019-07-16 MED ORDER — DIPHENHYDRAMINE HCL 12.5 MG/5ML PO ELIX: 12.5000 mg | ORAL_SOLUTION | Freq: Four times a day (QID) | ORAL | Status: DC | PRN

## 2019-07-16 MED ORDER — FUROSEMIDE 40 MG PO TABS
40.0000 mg | ORAL_TABLET | Freq: Two times a day (BID) | ORAL | Status: DC
  Administered 2019-07-16 – 2019-07-19 (×6): 40 mg via ORAL
  Filled 2019-07-16 (×6): qty 1

## 2019-07-16 MED ORDER — CAMPHOR-MENTHOL 0.5-0.5 % EX LOTN
1.0000 "application " | TOPICAL_LOTION | Freq: Three times a day (TID) | CUTANEOUS | Status: DC | PRN
  Filled 2019-07-16: qty 222

## 2019-07-16 MED ORDER — INSULIN DETEMIR 100 UNIT/ML ~~LOC~~ SOLN
35.0000 [IU] | Freq: Two times a day (BID) | SUBCUTANEOUS | Status: DC
  Administered 2019-07-16 – 2019-07-21 (×10): 35 [IU] via SUBCUTANEOUS
  Filled 2019-07-16 (×11): qty 0.35

## 2019-07-16 MED ORDER — PROCHLORPERAZINE 25 MG RE SUPP: 12.5000 mg | Freq: Four times a day (QID) | RECTAL | Status: DC | PRN

## 2019-07-16 MED ORDER — SEVELAMER CARBONATE 800 MG PO TABS
800.0000 mg | ORAL_TABLET | Freq: Three times a day (TID) | ORAL | Status: DC
  Administered 2019-07-16 – 2019-07-20 (×10): 800 mg via ORAL
  Filled 2019-07-16 (×10): qty 1

## 2019-07-16 MED ORDER — LIDOCAINE HCL (PF) 1 % IJ SOLN
5.0000 mL | INTRAMUSCULAR | Status: DC | PRN
  Filled 2019-07-16: qty 5

## 2019-07-16 MED ORDER — LIDOCAINE-PRILOCAINE 2.5-2.5 % EX CREA
1.0000 "application " | TOPICAL_CREAM | CUTANEOUS | Status: DC | PRN
  Filled 2019-07-16: qty 5

## 2019-07-16 MED ORDER — CALCIUM CARBONATE ANTACID 1250 MG/5ML PO SUSP
500.0000 mg | Freq: Four times a day (QID) | ORAL | Status: DC | PRN
  Filled 2019-07-16: qty 5

## 2019-07-16 MED ORDER — ALLOPURINOL 100 MG PO TABS
200.0000 mg | ORAL_TABLET | Freq: Every day | ORAL | Status: DC
  Administered 2019-07-17 – 2019-08-03 (×16): 200 mg via ORAL
  Filled 2019-07-16 (×18): qty 2

## 2019-07-16 MED ORDER — GABAPENTIN 100 MG PO CAPS
100.0000 mg | ORAL_CAPSULE | Freq: Three times a day (TID) | ORAL | Status: DC
  Administered 2019-07-16 – 2019-08-03 (×52): 100 mg via ORAL
  Filled 2019-07-16 (×52): qty 1

## 2019-07-16 MED ORDER — TRAZODONE HCL 50 MG PO TABS
25.0000 mg | ORAL_TABLET | Freq: Every evening | ORAL | Status: DC | PRN
  Administered 2019-07-18 – 2019-08-02 (×8): 50 mg via ORAL
  Filled 2019-07-16 (×9): qty 1

## 2019-07-16 MED ORDER — CALCIUM CARBONATE ANTACID 1250 MG/5ML PO SUSP: 500.0000 mg | Freq: Four times a day (QID) | ORAL | Status: DC | PRN

## 2019-07-16 MED ORDER — FLUTICASONE PROPIONATE 50 MCG/ACT NA SUSP
1.0000 | Freq: Every day | NASAL | Status: DC
  Administered 2019-07-20 – 2019-08-02 (×6): 1 via NASAL
  Filled 2019-07-16: qty 16

## 2019-07-16 MED ORDER — CHLORHEXIDINE GLUCONATE CLOTH 2 % EX PADS
6.0000 | MEDICATED_PAD | Freq: Every day | CUTANEOUS | Status: DC
  Administered 2019-07-17 – 2019-08-01 (×14): 6 via TOPICAL

## 2019-07-16 MED ORDER — ALBUTEROL SULFATE (2.5 MG/3ML) 0.083% IN NEBU: 3.0000 mL | INHALATION_SOLUTION | RESPIRATORY_TRACT | Status: DC | PRN

## 2019-07-16 MED ORDER — AMLODIPINE BESYLATE 10 MG PO TABS
10.0000 mg | ORAL_TABLET | Freq: Every day | ORAL | Status: DC
  Administered 2019-07-17 – 2019-07-22 (×5): 10 mg via ORAL
  Filled 2019-07-16 (×5): qty 1

## 2019-07-16 MED ORDER — POLYETHYLENE GLYCOL 3350 17 G PO PACK
17.0000 g | PACK | Freq: Every day | ORAL | Status: DC | PRN
  Filled 2019-07-16: qty 1

## 2019-07-16 MED ORDER — INSULIN ASPART 100 UNIT/ML ~~LOC~~ SOLN
0.0000 [IU] | Freq: Three times a day (TID) | SUBCUTANEOUS | Status: DC
  Administered 2019-07-16: 7 [IU] via SUBCUTANEOUS
  Administered 2019-07-17 (×2): 3 [IU] via SUBCUTANEOUS
  Administered 2019-07-18 – 2019-07-20 (×4): 4 [IU] via SUBCUTANEOUS
  Administered 2019-07-20: 10:00:00 3 [IU] via SUBCUTANEOUS
  Administered 2019-07-20: 13:00:00 4 [IU] via SUBCUTANEOUS
  Administered 2019-07-21: 10:00:00 7 [IU] via SUBCUTANEOUS
  Administered 2019-07-22 (×2): 4 [IU] via SUBCUTANEOUS
  Administered 2019-07-23 – 2019-07-24 (×3): 3 [IU] via SUBCUTANEOUS
  Administered 2019-07-25: 7 [IU] via SUBCUTANEOUS
  Administered 2019-07-26: 2 [IU] via SUBCUTANEOUS
  Administered 2019-07-26 – 2019-07-27 (×3): 4 [IU] via SUBCUTANEOUS
  Administered 2019-07-27 – 2019-07-28 (×3): 3 [IU] via SUBCUTANEOUS
  Administered 2019-07-28: 4 [IU] via SUBCUTANEOUS
  Administered 2019-07-29 – 2019-07-31 (×2): 3 [IU] via SUBCUTANEOUS
  Administered 2019-08-02: 2 [IU] via SUBCUTANEOUS

## 2019-07-16 NOTE — IPOC Note (Addendum)
Overall Plan of Care [facility]) Patient Details Name: Kelly Key MRN: [id number] DOB: 10-19-1971  Admitting Diagnosis: Physical debility  Key Problems: Principal Problem:   Physical debility Active Problems:   ESRD on dialysis ([name])   Anemia of chronic disease   Uncontrolled type 2 diabetes mellitus with hyperglycemia ([place])   Pain     Functional Problem List: Nursing Edema, Endurance, Medication Management, Motor, Perception, Safety, Skin Integrity  PT Balance, Edema, Endurance, Motor, Nutrition, Skin Integrity, Sensory  OT Balance, Edema, Endurance, Motor, Pain, Sensory  SLP    TR         Basic ADL's: OT Bathing, Dressing, Toileting     Advanced  ADL's: OT       Transfers: PT Bed Mobility, Bed to Chair, Car, Manufacturing systems engineer, Metallurgist: PT Ambulation, Emergency planning/management officer, Stairs     Additional Impairments: OT None  SLP        TR      Anticipated Outcomes Item Anticipated Outcome  Self Feeding no goal set  Swallowing      Basic self-care  (S)  Toileting  (S)   Bathroom Transfers (S)  Bowel/Bladder  mod I  Transfers  supervision  Locomotion  supervision  Communication     Cognition     Pain  less than 2  Safety/Judgment  mod I   Therapy Plan: PT Intensity: Minimum of 1-2 x/day ,45 to 90 minutes PT Frequency: 5 out of 7 days PT Duration Estimated Length of Stay: 2-2.5 weeks OT Intensity: Minimum of 1-2 x/day, 45 to 90 minutes OT Frequency: 5 out of 7 days OT Duration/Estimated Length of Stay: 14-18 days     Due to the current state of emergency, patients may not be receiving their 3-hours of Medicare-mandated therapy.   Team Interventions: Nursing Interventions Patient/Family Education, Medication Management, Discharge Planning, Skin Care/Wound Management, Psychosocial Support, Disease Management/Prevention  PT interventions Ambulation/gait training, Community reintegration, DME/adaptive equipment instruction,  Neuromuscular re-education, Stair training, UE/LE Strength taining/ROM, Training and development officer, Discharge planning, Pain management, Therapeutic Activities, UE/LE Coordination activities, Disease management/prevention, Functional mobility training, Patient/family education, Therapeutic Exercise, Skin care/wound management  OT Interventions Balance/vestibular training, Self Care/advanced ADL retraining, Therapeutic Exercise, UE/LE Strength taining/ROM, Pain management, DME/adaptive equipment instruction, Community reintegration, Discharge planning, UE/LE Coordination activities, Patient/family education, Therapeutic Activities, Psychosocial support, Functional mobility training  SLP Interventions    TR Interventions    SW/CM Interventions  Discharge Planning, Psychosocial Assessment, pt & Family Education   Barriers to Discharge MD  Medical stability, Weight and Hemodialysis  Nursing Other (comments)    PT Hemodialysis    OT Decreased caregiver support    SLP      SW       Team Discharge Planning: Destination: PT-Home ,OT- Home , SLP-  Projected Follow-up: PT-Home health PT, OT-  Home health OT, SLP-  Projected Equipment Needs: PT-To be determined, OT- To be determined, SLP-  Equipment Details: PT- , OT-  Patient/family involved in discharge planning: PT- Patient,  OT-Patient, SLP-   MD ELOS: 12-15 days. Medical Rehab Prognosis:  Good Assessment: Kelly Key with history of T2DM--poorly controlled, malignant hypertension, morbid obesity, CKD stage IV who was recently admitted to [facility] 8/26- 06/27/19  2-week history of worsening of peripheral edema and abdominal girth.  He was started aggressive IV diuresis for acute on chronic renal failure with anasarca and Nephrology recommended dialysis however patient adamantly declined this and preferred to go home with increase in diuretics.  He was readmitted on 07/03/2019 due to increasing lower extremity edema, difficulty with ambulation and  lying flat as well as foot pain.  He ultimately was agreeable to dialysis.  Tunneled HD catheter placed by Kelly Key on 07/08/2019 and hemodialysis initiated.  Blood pressures remain labile and MRA renal arteries ordered to rule out stenosis but nephrology doubts any intervention would make a difference in renal status.  To follow-up with Kelly Key after discharge he continues to refuse placement of permanent access.  Acute on chronic diastolic failure improving with dialysis with decrease in anasarca.  He continues to have issues with debility affecting ADLs and mobility.  Patient with resulting deficits with mobility, endurance, self-care.  We will set goals for supervision for most tasks PT/OT.  See Team Conference Notes for weekly updates to the plan of care

## 2019-07-16 NOTE — H&P (Signed)
Physical Medicine and Rehabilitation Admission H&P    Chief Complaint  Patient presents with  .  Functional decline due to renal failure     HPI: Kelly Key is a 48 year old male with history of T2DM--poorly controlled, malignant hypertension, morbid obesity, CKD stage IV who was recently admitted to [facility] 8/26- 06/27/19  2-week history of worsening of peripheral edema and abdominal girth.  History taken from chart review and patient.  He was started aggressive IV diuresis for acute on chronic renal failure with anasarca and Nephrology recommended dialysis however patient adamantly declined this and preferred to go home with increase in diuretics.  He was readmitted on 07/03/2019 due to increasing lower extremity edema, difficulty with ambulation and lying flat as well as foot pain.  He ultimately was agreeable to dialysis.  Tunneled HD catheter placed by [name] on 07/08/2019 and hemodialysis initiated.  Blood pressures remain labile and MRA renal arteries ordered to rule out stenosis but nephrology doubts any intervention would make a difference in renal status.  To follow-up with [name] after discharge he continues to refuse placement of permanent access.  Acute on chronic diastolic failure improving with dialysis with decrease in anasarca.  He continues to have issues with debility affecting ADLs and mobility.  CIR recommended due to recent functional decline.   He has been accepted at [facility] for TTS at noon  Review of Systems  Constitutional: Positive for malaise/fatigue. Negative for chills and fever.  HENT: Negative for hearing loss and tinnitus.   Eyes: Negative for blurred vision and double vision.  Respiratory: Negative for cough and shortness of breath.   Cardiovascular: Positive for chest pain (left chest pressure has resolved). Negative for palpitations.  Gastrointestinal: Positive for constipation. Negative for heartburn and nausea.  Musculoskeletal: Negative for back pain,  myalgias and neck pain.  Neurological: Positive for dizziness (occasionally with mobility. ) and weakness. Negative for headaches.  Psychiatric/Behavioral: The patient does not have insomnia.      Past Medical History:  Diagnosis Date  . Acute diastolic CHF (congestive heart failure) ([place]) 06/11/2012   EF 50-55% [facility])  . Diabetes mellitus    A1c 11.5 (06/11/2012).  . Gout   . Hepatic steatosis 06/11/2012   Elevated LFTs  . Hyperlipemia   . Malignant hypertension   . Microcytic anemia 06/12/2012  . Obesity     Past Surgical History:  Procedure Laterality Date  . IR FLUORO GUIDE CV LINE RIGHT  07/08/2019  . IR US GUIDE VASC ACCESS RIGHT  07/08/2019  . NO PAST SURGERIES      Family History  Problem Relation Age of Onset  . Gout Mother   . Asthma Mother   . Diabetes Father   . Heart failure Father   . Diabetes Sister   . Hypertension Brother   . Pancreatic cancer Brother   . Diabetes Sister     Social History:  Lives with fiance. Retired then went on disability 4-5 years ago. Has been sedentary with limited mobility to for past  6 months due to "fluid build up"--used walker PTA.  Due to leg weakness. He reports that he has never smoked. He has never used smokeless tobacco. He reports that he does not drink alcohol or use drugs.    Allergies  Allergen Reactions  . Hydralazine   . Lisinopril Swelling    Medications Prior to Admission  Medication Sig Dispense Refill  . albuterol (VENTOLIN HFA) 108 (90 Base) MCG/ACT inhaler Inhale 2  puffs into the lungs every 4 (four) hours as needed.    [name] amLODipine (NORVASC) 10 MG tablet Take 10 mg by mouth daily.    [name] atorvastatin (LIPITOR) 80 MG tablet Take 1 tablet by mouth daily.    . Calcium Carbonate Antacid (CALCIUM CARBONATE, DOSED IN MG ELEMENTAL CALCIUM,) 1250 MG/5ML SUSP Take 5 mLs (500 mg of elemental calcium total) by mouth every 6 (six) hours as needed for indigestion. 450 mL   . carvedilol (COREG) 25 MG tablet  Take 1 tablet (25 mg total) by mouth 2 (two) times daily with a meal. 60 tablet 1  . cloNIDine (CATAPRES) 0.3 MG tablet Take 1 tablet (0.3 mg total) by mouth 3 (three) times daily. 90 tablet 0  . ergocalciferol (VITAMIN D2) 1.25 MG (50000 UT) capsule Take 50,000 Units by mouth once a week.    . ezetimibe (ZETIA) 10 MG tablet Take 10 mg by mouth every evening.    . fluticasone (FLONASE) 50 MCG/ACT nasal spray Place 1 spray into both nostrils daily. 1 g 0  . gabapentin (NEURONTIN) 100 MG capsule Take 1 capsule by mouth 3 (three) times daily.    . insulin aspart (NOVOLOG) 100 UNIT/ML injection Inject 5-15 Units into the skin 3 (three) times daily before meals.     . insulin detemir (LEVEMIR) 100 UNIT/ML injection Inject 0.35 mLs (35 Units total) into the skin 2 (two) times daily. 10 mL 1  . Insulin Pen Needle 31G X 5 MM MISC Use as directed. 100 each 0  . linaclotide (LINZESS) 145 MCG CAPS capsule Take 1 capsule (145 mcg total) by mouth daily before breakfast. 30 capsule 0  . sevelamer carbonate (RENVELA) 800 MG tablet Take 1 tablet (800 mg total) by mouth 3 (three) times daily with meals.    . torsemide (DEMADEX) 20 MG tablet Take 3 tablets (60 mg total) by mouth 2 (two) times daily. 180 tablet 1    Drug Regimen Review  Drug regimen was reviewed and remains appropriate with no significant issues identified  Home: Home Living Family/patient expects to be discharged to:: Private residence Living Arrangements: Alone Available Help at Discharge: Friend(s) Type of Home: House Home Access: Level entry Home Layout: One level Bathroom Shower/Tub: Chiropodist: Standard Home Equipment: Environmental consultant - 2 wheels   Functional History: Prior Function Level of Independence: Independent with assistive device(s)  Functional Status:  Mobility: Bed Mobility Overal bed mobility: Needs Assistance Bed Mobility: Supine to Sit Supine to sit: Supervision, HOB elevated General bed mobility  comments: increased time and effort, +rail Transfers Overall transfer level: Needs assistance Equipment used: Rolling walker (2 wheeled) Transfers: Sit to/from Stand Sit to Stand: Min assist Stand pivot transfers: Min assist, +2 physical assistance General transfer comment: increased time and effort to power up and stabilize standing balance Ambulation/Gait Ambulation/Gait assistance: Min assist Gait Distance (Feet): 10 Feet(x 2) Assistive device: Rolling walker (2 wheeled) Gait Pattern/deviations: Step-through pattern, Trunk flexed General Gait Details: heavy reliance on RW; slow, guarded gait; 10' x 2 trials Gait velocity: decreased Gait velocity interpretation: <1.8 ft/sec, indicate of risk for recurrent falls    ADL: ADL Overall ADL's : Needs assistance/impaired Eating/Feeding: Independent, Sitting Grooming: Wash/dry hands, Wash/dry face, Sitting, Min guard Upper Body Bathing: Min guard, Sitting Lower Body Bathing: Maximal assistance, Sitting/lateral leans Upper Body Dressing : Min guard, Sitting Lower Body Dressing: Total assistance Toilet Transfer: Moderate assistance, Minimal assistance, +2 for safety/equipment, Cueing for safety, Cueing for sequencing, Ambulation, RW Toileting- Clothing Manipulation  and Hygiene: Total assistance Functional mobility during ADLs: Moderate assistance, Minimal assistance, +2 for physical assistance, Cueing for safety, Cueing for sequencing, Rolling walker  Cognition: Cognition Overall Cognitive Status: Within Functional Limits for tasks assessed Orientation Level: Oriented X4 Cognition Arousal/Alertness: Awake/alert Behavior During Therapy: WFL for tasks assessed/performed Overall Cognitive Status: Within Functional Limits for tasks assessed General Comments: very motivated  Physical Exam: Blood pressure (!) 156/83, pulse 81, temperature 97.6 F (36.4 C), temperature source Oral, resp. rate 18, height 5\' 7"  (1.702 m), weight 122 kg,  SpO2 96 %. Physical Exam  Nursing note and vitals reviewed. Constitutional: He appears well-developed. No distress.  Morbidly obese  HENT:  Head: Normocephalic and atraumatic.  Eyes: EOM are normal. Right eye exhibits no discharge. Left eye exhibits no discharge.  Neck: No tracheal deviation present. No thyromegaly present.  Respiratory: Effort normal.  GI: He exhibits distension.  Musculoskeletal:     Comments: No edema or tenderness in extremities  Neurological: He is alert.  Motor: 4-/5 throughout  Skin: He is not diaphoretic.  Vascular changes bilateral lower extremities  Psychiatric: His speech is normal. His affect is blunt.    Results for orders placed or performed during the hospital encounter of 07/03/19 (from the past 48 hour(s))  Glucose, capillary     Status: Abnormal   Collection Time: 07/14/19 10:01 PM  Result Value Ref Range   Glucose-Capillary 219 (H) 70 - 99 mg/dL  Glucose, capillary     Status: Abnormal   Collection Time: 07/15/19 12:32 AM  Result Value Ref Range   Glucose-Capillary 207 (H) 70 - 99 mg/dL  CBC with Differential/Platelet     Status: Abnormal   Collection Time: 07/15/19  2:00 AM  Result Value Ref Range   WBC 9.6 4.0 - 10.5 K/uL   RBC 4.34 4.22 - 5.81 MIL/uL   Hemoglobin 10.7 (L) 13.0 - 17.0 g/dL   HCT 33.4 (L) 39.0 - 52.0 %   MCV 77.0 (L) 80.0 - 100.0 fL   MCH 24.7 (L) 26.0 - 34.0 pg   MCHC 32.0 30.0 - 36.0 g/dL   RDW 15.4 11.5 - 15.5 %   Platelets 500 (H) 150 - 400 K/uL   nRBC 0.0 0.0 - 0.2 %   Neutrophils Relative % 51 %   Neutro Abs 5.0 1.7 - 7.7 K/uL   Lymphocytes Relative 31 %   Lymphs Abs 3.0 0.7 - 4.0 K/uL   Monocytes Relative 9 %   Monocytes Absolute 0.9 0.1 - 1.0 K/uL   Eosinophils Relative 7 %   Eosinophils Absolute 0.6 (H) 0.0 - 0.5 K/uL   Basophils Relative 1 %   Basophils Absolute 0.1 0.0 - 0.1 K/uL   Immature Granulocytes 1 %   Abs Immature Granulocytes 0.07 0.00 - 0.07 K/uL    Comment: Performed at [facility], [address], [place], [place] [address]  Renal function panel     Status: Abnormal   Collection Time: 07/15/19  2:01 AM  Result Value Ref Range   Sodium 129 (L) 135 - 145 mmol/L   Potassium 4.4 3.5 - 5.1 mmol/L   Chloride 90 (L) 98 - 111 mmol/L   CO2 22 22 - 32 mmol/L   Glucose, Bld 196 (H) 70 - 99 mg/dL   BUN 106 (H) 6 - 20 mg/dL   Creatinine, Ser 8.17 (H) 0.61 - 1.24 mg/dL   Calcium 9.1 8.9 - 10.3 mg/dL   Phosphorus 9.2 (H) 2.5 - 4.6 mg/dL  Albumin 2.5 (L) 3.5 - 5.0 g/dL   GFR calc non Af Amer 7 (L) >60 mL/min   GFR calc Af Amer 8 (L) >60 mL/min   Anion gap 17 (H) 5 - 15    Comment: Performed at [facility] [address] [place], [place] [address]  Renal function panel     Status: Abnormal   Collection Time: 07/15/19  5:55 AM  Result Value Ref Range   Sodium 134 (L) 135 - 145 mmol/L   Potassium 4.0 3.5 - 5.1 mmol/L   Chloride 97 (L) 98 - 111 mmol/L   CO2 23 22 - 32 mmol/L   Glucose, Bld 144 (H) 70 - 99 mg/dL   BUN 46 (H) 6 - 20 mg/dL   Creatinine, Ser 4.61 (H) 0.61 - 1.24 mg/dL    Comment: DELTA CHECK NOTED DIALYSIS    Calcium 8.7 (L) 8.9 - 10.3 mg/dL   Phosphorus 4.7 (H) 2.5 - 4.6 mg/dL   Albumin 2.5 (L) 3.5 - 5.0 g/dL   GFR calc non Af Amer 14 (L) >60 mL/min   GFR calc Af Amer 16 (L) >60 mL/min   Anion gap 14 5 - 15    Comment: Performed at [facility], [place] [address], [place], [place] [address]  Glucose, capillary     Status: Abnormal   Collection Time: 07/15/19  7:22 AM  Result Value Ref Range   Glucose-Capillary 161 (H) 70 - 99 mg/dL  Glucose, capillary     Status: Abnormal   Collection Time: 07/15/19 11:32 AM  Result Value Ref Range   Glucose-Capillary 182 (H) 70 - 99 mg/dL  Glucose, capillary     Status: Abnormal   Collection Time: 07/15/19  4:29 PM  Result Value Ref Range   Glucose-Capillary 204 (H) 70 - 99 mg/dL  Glucose, capillary     Status: Abnormal   Collection Time: 07/15/19  8:47 PM  Result Value Ref Range    Glucose-Capillary 228 (H) 70 - 99 mg/dL  Glucose, capillary     Status: Abnormal   Collection Time: 07/16/19  6:48 AM  Result Value Ref Range   Glucose-Capillary 126 (H) 70 - 99 mg/dL  Glucose, capillary     Status: Abnormal   Collection Time: 07/16/19 11:05 AM  Result Value Ref Range   Glucose-Capillary 168 (H) 70 - 99 mg/dL   No results found.     Medical Problem List and Plan: 1.  Generalized weakness with limitations in self-care secondary to debility  Admit to CIR 2.  Antithrombotics: -DVT/anticoagulation:  Pharmaceutical: Heparin  -antiplatelet therapy: On ASA 81 mg daily 3. Pain Management: On gabapentin 3 times daily for neuropathy with hydrocodone on PRN basis  Monitor with increased mobility 4. Mood: LCSW to follow for evaluation and support  -antipsychotic agents: N/A 5. Neuropsych: This patient is capable of making decisions on his own behalf. 6. Skin/Wound Care: Routine pressure relief measures 7. Fluids/Electrolytes/Nutrition: CM/renal diet with 1200 cc FR.  8.  T2DM with hyperglycemia: Hemoglobin A1c-12.4 and poorly controlled.  Continue Levemir twice daily and use SSI for tighter blood sugar control.  Monitor blood sugars AC/HS.   Monitor with increased mobility 9.  Malignant hypertension: Monitor BP TID--follow-up with [name] for work-up to rule out RAS. Continue amlodipine, Coreg twice daily, clonidine TID,  IV furosemide BID  Monitor with increased mobility 10.  Anemia of chronic disease: Received IV iron  Labs with HD 11.  ESRD/chronic combined CHF: Anasarca multifactorial and  improving with hemodialysis.  Daily weights  Bary Leriche, PA-C 07/16/2019   I have personally performed a face to face diagnostic evaluation, including, but not limited to relevant history and physical exam findings, of this patient and developed relevant assessment and plan.  Additionally, I have reviewed and concur with the physician assistant's documentation above.  Delice Lesch, MD, ABPMR  The patient's status has not changed. The original post admission physician evaluation remains appropriate, and any changes from the pre-admission screening or documentation from the acute chart are noted above.   Delice Lesch, MD, ABPRM

## 2019-07-16 NOTE — Plan of Care (Signed)
  Problem: Education: Goal: Knowledge of General Education information will improve Description: Including pain rating scale, medication(s)/side effects and non-pharmacologic comfort measures Outcome: Progressing   Problem: Clinical Measurements: Goal: Diagnostic test results will improve Outcome: Progressing   Problem: Activity: Goal: Risk for activity intolerance will decrease Outcome: Progressing   

## 2019-07-16 NOTE — Progress Notes (Signed)
[facility]    NEPHROLOGY PROGRESS NOTE  SUBJECTIVE:  To CIR today, then dialysis.     OBJECTIVE:  Vitals:   07/16/19 0552 07/16/19 0919  BP: (!) 156/92 (!) 156/83  Pulse: 77 81  Resp: 18 18  Temp: 97.6 F (36.4 C)   SpO2: 98% 96%    Intake/Output Summary (Last 24 hours) at 07/16/2019 1128 Last data filed at 07/16/2019 0551 Gross per 24 hour  Intake 460 ml  Output 300 ml  Net 160 ml      General:  AAOx3 NAD, sitting in chair Neck:  No JVD CV:  Heart RRR  Lungs: clear anteriorly Abd:  abd SNT/ND with normal BS Extremities: +1 bilateral lower extremity edema. ACCESS: R IJ TDC   MEDICATIONS:  . allopurinol  200 mg Oral Daily  . amLODipine  10 mg Oral Daily  . aspirin EC  81 mg Oral Daily  . atorvastatin  80 mg Oral Daily  . carvedilol  25 mg Oral BID WC  . Chlorhexidine Gluconate Cloth  6 each Topical Q0600  . cloNIDine  0.3 mg Oral TID  . ezetimibe  10 mg Oral QPM  . fluticasone  1 spray Each Nare Daily  . furosemide  40 mg Oral BID  . gabapentin  100 mg Oral TID  . heparin  5,000 Units Subcutaneous Q8H  . insulin aspart  0-20 Units Subcutaneous TID WC  . insulin aspart  0-5 Units Subcutaneous QHS  . insulin detemir  35 Units Subcutaneous BID  . linaclotide  145 mcg Oral QAC breakfast  . potassium chloride  20 mEq Oral Once  . sevelamer carbonate  800 mg Oral TID WC  . sodium chloride flush  3 mL Intravenous Q12H  . traMADol  50 mg Oral Once       LABS:   CBC Latest Ref Rng & Units 07/15/2019 07/12/2019 07/10/2019  WBC 4.0 - 10.5 K/uL 9.6 8.8 8.3  Hemoglobin 13.0 - 17.0 g/dL 10.7(L) 10.1(L) 9.7(L)  Hematocrit 39.0 - 52.0 % 33.4(L) 31.5(L) 30.2(L)  Platelets 150 - 400 K/uL 500(H) 489(H) 481(H)    CMP Latest Ref Rng & Units 07/15/2019 07/15/2019 07/14/2019  Glucose 70 - 99 mg/dL 144(H) 196(H) 150(H)  BUN 6 - 20 mg/dL 46(H) 106(H) 89(H)  Creatinine 0.61 - 1.24 mg/dL 4.61(H) 8.17(H) 7.77(H)  Sodium 135 - 145 mmol/L 134(L) 129(L) 131(L)   Potassium 3.5 - 5.1 mmol/L 4.0 4.4 4.4  Chloride 98 - 111 mmol/L 97(L) 90(L) 91(L)  CO2 22 - 32 mmol/L 23 22 22   Calcium 8.9 - 10.3 mg/dL 8.7(L) 9.1 8.9  Total Protein 6.5 - 8.1 g/dL - - -  Total Bilirubin 0.3 - 1.2 mg/dL - - -  Alkaline Phos 38 - 126 U/L - - -  AST 15 - 41 U/L - - -  ALT 0 - 44 U/L - - -    Lab Results  Component Value Date   CALCIUM 8.7 (L) 07/15/2019   PHOS 4.7 (H) 07/15/2019       Component Value Date/Time   COLORURINE STRAW (A) 06/23/2019 0500   APPEARANCEUR CLEAR 06/23/2019 0500   LABSPEC 1.010 06/23/2019 0500   PHURINE 6.0 06/23/2019 0500   GLUCOSEU >=500 (A) 06/23/2019 0500   HGBUR SMALL (A) 06/23/2019 0500   BILIRUBINUR NEGATIVE 06/23/2019 0500   KETONESUR NEGATIVE 06/23/2019 0500   PROTEINUR >=300 (A) 06/23/2019 0500   UROBILINOGEN 0.2 06/11/2012 0518   NITRITE NEGATIVE 06/23/2019 0500   LEUKOCYTESUR NEGATIVE 06/23/2019 0500  No results found for: PHART, PCO2ART, PO2ART, HCO3, TCO2, ACIDBASEDEF, O2SAT     Component Value Date/Time   IRON 125 07/09/2019 1312   TIBC 260 07/09/2019 1312   FERRITIN 449 (H) 07/09/2019 1312   IRONPCTSAT 48 (H) 07/09/2019 1312       ASSESSMENT/PLAN:    Kelly Key an 48 y.o.malewitha PMH significant for dCHF, DM, HTN, HLD, morbid obesity, fatty liver disease, and CKD stage 4 followed by Kelly Key with nephrotic range proteinuria (documented in past as >11 grams/24 hours) who was recently hospitalized on 06/22/2019 [facility] with a 2 week history of worsening lower extremity edema and abdominal girth not responsive to increasing doses of torsemide as an outpatient. He has had accelerated HTN and workup for renal artery stenosis was underway with Kelly Key however that has not been scheduled.During that hospitalization there were extensive discussions with the pt educating him about the need to initiate dialysis but he refused wanting to give a trial with outpt diuretics. In the past Kelly Key hadexpressed  that he would "rather die than go on dialysis" and is followed by Kelly Key, however on further discussion he always states that he's not ready to die. Patient went home with torsemide 60mg  BID and unfortunately did not respond even though he was compliant.   1.  End-stage renal disease. Status post [facility] 07/08/2019 and first HD.  3rd HD 9/15, now on TTS.  Discussed permanent access again with him today--> he still wants to hold off but it seems like he's more receptive to discussions.  Will discuss again tomorrow 9/20.       2.  Hypertension.  Should improve with ultrafiltration.  On home dose of amlodipine.  3.  Acute on chronic diastolic congestive heart failure. Improving with dialysis.  4.  Malignant hypertension.  Will monitor blood pressure with dialysis.  Doubt intervention on any significant renal artery stenosis would make a difference in his renal function.    5.  Diabetes mellitus.  Overall poor control with a hemoglobin A1c of 12.4.  6.  Volume overload.  Should improve with ultrafiltration on dialysis.  7.  Anemia.  Is iron replete.  Hgb 10.1, will follow and dose Aranesp as appropriate- no ESA yet.    8.  BMD. Cont  phosphate binders.  9.  To CIR, CLIP

## 2019-07-16 NOTE — Plan of Care (Signed)
  Problem: Education: Goal: Knowledge of General Education information will improve Description: Including pain rating scale, medication(s)/side effects and non-pharmacologic comfort measures 07/16/2019 1119 by Dolores Hoose, RN Outcome: Adequate for Discharge 07/16/2019 0737 by Dolores Hoose, RN Outcome: Progressing   Problem: Health Behavior/Discharge Planning: Goal: Ability to manage health-related needs will improve Outcome: Adequate for Discharge   Problem: Clinical Measurements: Goal: Ability to maintain clinical measurements within normal limits will improve Outcome: Adequate for Discharge Goal: Will remain free from infection Outcome: Adequate for Discharge Goal: Diagnostic test results will improve 07/16/2019 1119 by Dolores Hoose, RN Outcome: Adequate for Discharge 07/16/2019 0737 by Dolores Hoose, RN Outcome: Progressing Goal: Respiratory complications will improve Outcome: Adequate for Discharge Goal: Cardiovascular complication will be avoided Outcome: Adequate for Discharge   Problem: Activity: Goal: Risk for activity intolerance will decrease 07/16/2019 1119 by Dolores Hoose, RN Outcome: Adequate for Discharge 07/16/2019 0737 by Dolores Hoose, RN Outcome: Progressing   Problem: Nutrition: Goal: Adequate nutrition will be maintained Outcome: Adequate for Discharge   Problem: Coping: Goal: Level of anxiety will decrease Outcome: Adequate for Discharge   Problem: Elimination: Goal: Will not experience complications related to bowel motility Outcome: Adequate for Discharge Goal: Will not experience complications related to urinary retention Outcome: Adequate for Discharge   Problem: Pain Managment: Goal: General experience of comfort will improve Outcome: Adequate for Discharge   Problem: Safety: Goal: Ability to remain free from injury will improve Outcome: Adequate for Discharge   Problem: Skin Integrity: Goal: Risk for impaired  skin integrity will decrease Outcome: Adequate for Discharge   Problem: Education: Goal: Knowledge of General Education information will improve Description: Including pain rating scale, medication(s)/side effects and non-pharmacologic comfort measures 07/16/2019 1119 by Dolores Hoose, RN Outcome: Adequate for Discharge 07/16/2019 0737 by Dolores Hoose, RN Outcome: Progressing   Problem: Health Behavior/Discharge Planning: Goal: Ability to manage health-related needs will improve Outcome: Adequate for Discharge   Problem: Clinical Measurements: Goal: Ability to maintain clinical measurements within normal limits will improve Outcome: Adequate for Discharge Goal: Will remain free from infection Outcome: Adequate for Discharge Goal: Diagnostic test results will improve 07/16/2019 1119 by Dolores Hoose, RN Outcome: Adequate for Discharge 07/16/2019 0737 by Dolores Hoose, RN Outcome: Progressing Goal: Respiratory complications will improve Outcome: Adequate for Discharge Goal: Cardiovascular complication will be avoided Outcome: Adequate for Discharge   Problem: Activity: Goal: Risk for activity intolerance will decrease 07/16/2019 1119 by Dolores Hoose, RN Outcome: Adequate for Discharge 07/16/2019 0737 by Dolores Hoose, RN Outcome: Progressing   Problem: Nutrition: Goal: Adequate nutrition will be maintained Outcome: Adequate for Discharge   Problem: Coping: Goal: Level of anxiety will decrease Outcome: Adequate for Discharge   Problem: Elimination: Goal: Will not experience complications related to bowel motility Outcome: Adequate for Discharge Goal: Will not experience complications related to urinary retention Outcome: Adequate for Discharge   Problem: Pain Managment: Goal: General experience of comfort will improve Outcome: Adequate for Discharge   Problem: Safety: Goal: Ability to remain free from injury will improve Outcome: Adequate for  Discharge   Problem: Skin Integrity: Goal: Risk for impaired skin integrity will decrease Outcome: Adequate for Discharge

## 2019-07-16 NOTE — Progress Notes (Signed)
Angelica Leonar, RN called and informed the patient's HD treatment has been moved to 07/17/19.

## 2019-07-16 NOTE — Discharge Summary (Signed)
Physician Discharge Summary  Kelly Key IRJ:[id number] DOB: Nov 05, 1970 DOA: 07/03/2019  PCP: [name], MD  Admit date: 07/03/2019 Discharge date: 07/16/2019  Admitted From: Home Disposition: CIR  Recommendations for Outpatient Follow-up:  1. Follow up with PCP in 1-2 weeks 2. Please obtain BMP/CBC in one week your next doctors visit.  3.  CODE STATUS: Full code Diet recommendation: Renal/carb modified  Brief/Interim Summary: 48 y.o.malewith medical history significant ofobesity (BMI 49); HTN; HLD: DM; stage 4 CKD; and chronic combined CHF (EF 50-55% in 2013) presenting with increased LE edema, decreased ambulation - readmitted on 07/03/2019 with anasarca in the setting of dCHF exacerbation- -transfered to [place] for tunneled HD catheter placement and initiation of hemodialysis. Patient admitted as above with worsening kidney function in the setting of heart failure exacerbation and volume overload. Patient now tolerating dialysis quite well, continues to diurese somewhat well but in general clinically improving. Advised my read, eval by physical therapy recommended CIR therefore arrangements made.   Discharge Diagnoses:  Principal Problem:   Anasarca Active Problems:   HTN (hypertension)   Morbid obesity (HCC)   CKD (chronic kidney disease) stage 5, GFR less than 15 ml/min (HCC)   Uncontrolled type 2 diabetes mellitus with hyperglycemia, with long-term current use of insulin (HCC)   Dyslipidemia   Chronic combined systolic and diastolic CHF (congestive heart failure) (HCC)   Volume overload   Congestive heart failure (HCC)   Anasarca, improving -  Multifactorial in the setting of CHF and worsening renal function.  Will resume torsemide unless nephrology says otherwise she is now on dialysis.  - Right IJ tunnel catheter placed by IR 9/11 -Echocardiogram 06/23/2019-ejection fraction 50-55%  Ambulatory dysfunction -  Transition patient to CIR  ESRD on  hemodialysis -Renal ultrasound-negative for obstruction. -Nephrology following, tolerating dialysis.  Appropriate renal medications per their service.  Will discontinue bicarb - Clipped for outpatient dialysis TTS  Essential hypertension - Plan for evaluation for outpatient renal artery stenosis.  This can be followed up by [name] -Continue Coreg, clonidine and Norvasc  Anemia of chronic disease secondary to renal dysfunction Iron deficiency anemia -Status post IV iron.  Hemoglobin stable.  Diabetes mellitus type 2 -Sliding scale Accu-Cheks.  Resume home regimen  Consultations:  Nephrology  Subjective: Feels okay, eager to be discharged to rehab.  Discharge Exam: Vitals:   07/16/19 0552 07/16/19 0919  BP: (!) 156/92 (!) 156/83  Pulse: 77 81  Resp: 18 18  Temp: 97.6 F (36.4 C)   SpO2: 98% 96%   Vitals:   07/15/19 1625 07/15/19 2046 07/16/19 0552 07/16/19 0919  BP: 125/86 130/77 (!) 156/92 (!) 156/83  Pulse: 81 75 77 81  Resp: '16 18 18 18  '$ Temp: 97.7 F (36.5 C) 97.6 F (36.4 C) 97.6 F (36.4 C)   TempSrc: Oral Oral Oral   SpO2: 96% 100% 98% 96%  Weight:   122 kg   Height:        General: Pt is alert, awake, not in acute distress Cardiovascular: RRR, S1/S2 +, no rubs, no gallops Respiratory: CTA bilaterally, no wheezing, no rhonchi Abdominal: Soft, NT, ND, bowel sounds + Extremities: no edema, no cyanosis  Discharge Instructions  Discharge Instructions    Call MD for:  difficulty breathing, headache or visual disturbances   Complete by: As directed    Call MD for:  extreme fatigue   Complete by: As directed    Call MD for:  persistant dizziness or light-headedness   Complete  by: As directed    Call MD for:  persistant nausea and vomiting   Complete by: As directed    Call MD for:  redness, tenderness, or signs of infection (pain, swelling, redness, odor or green/yellow discharge around incision site)   Complete by: As directed    Call MD for:   severe uncontrolled pain   Complete by: As directed    Call MD for:  temperature >100.4   Complete by: As directed    Increase activity slowly   Complete by: As directed      Allergies as of 07/16/2019      Reactions   Hydralazine    Lisinopril Swelling      Medication List    STOP taking these medications   potassium chloride SA 20 MEQ tablet Commonly known as: K-DUR   sodium bicarbonate 650 MG tablet     TAKE these medications   albuterol 108 (90 Base) MCG/ACT inhaler Commonly known as: VENTOLIN HFA Inhale 2 puffs into the lungs every 4 (four) hours as needed.   amLODipine 10 MG tablet Commonly known as: NORVASC Take 10 mg by mouth daily.   atorvastatin 80 MG tablet Commonly known as: LIPITOR Take 1 tablet by mouth daily.   calcium carbonate (dosed in mg elemental calcium) 1250 MG/5ML Susp Take 5 mLs (500 mg of elemental calcium total) by mouth every 6 (six) hours as needed for indigestion.   carvedilol 25 MG tablet Commonly known as: COREG Take 1 tablet (25 mg total) by mouth 2 (two) times daily with a meal.   cloNIDine 0.3 MG tablet Commonly known as: CATAPRES Take 1 tablet (0.3 mg total) by mouth 3 (three) times daily.   ergocalciferol 1.25 MG (50000 UT) capsule Commonly known as: VITAMIN D2 Take 50,000 Units by mouth once a week.   ezetimibe 10 MG tablet Commonly known as: ZETIA Take 10 mg by mouth every evening.   fluticasone 50 MCG/ACT nasal spray Commonly known as: FLONASE Place 1 spray into both nostrils daily.   gabapentin 100 MG capsule Commonly known as: NEURONTIN Take 1 capsule by mouth 3 (three) times daily.   insulin aspart 100 UNIT/ML injection Commonly known as: novoLOG Inject 5-15 Units into the skin 3 (three) times daily before meals.   insulin detemir 100 UNIT/ML injection Commonly known as: LEVEMIR Inject 0.35 mLs (35 Units total) into the skin 2 (two) times daily.   Insulin Pen Needle 31G X 5 MM Misc Use as directed.    linaclotide 145 MCG Caps capsule Commonly known as: LINZESS Take 1 capsule (145 mcg total) by mouth daily before breakfast.   sevelamer carbonate 800 MG tablet Commonly known as: RENVELA Take 1 tablet (800 mg total) by mouth 3 (three) times daily with meals.   torsemide 20 MG tablet Commonly known as: DEMADEX Take 3 tablets (60 mg total) by mouth 2 (two) times daily.            Durable Medical Equipment  (From admission, onward)         Start     Ordered   07/12/19 1508  For home use only DME 4 wheeled rolling walker with seat  Once    Comments: Needs bariatric size  Question:  Patient needs a walker to treat with the following condition  Answer:  Decreased functional mobility and endurance   07/12/19 1507   07/12/19 1416  DME 3-in-1  Once    Comments: Bariatric   07/12/19 1415  Follow-up Information    [facility] [employer] Follow up.   Why: Home Health PT - they will call you. Contact information: [place] [address] [phone]        [name], MD Follow up.   Specialty: Internal Medicine Contact information: [place] [address] [phone]          Allergies  Allergen Reactions  . Hydralazine   . Lisinopril Swelling    You were cared for by a hospitalist during your hospital stay. If you have any questions about your discharge medications or the care you received while you were in the hospital after you are discharged, you can call the unit and asked to speak with the hospitalist on call if the hospitalist that took care of you is not available. Once you are discharged, your primary care physician will handle any further medical issues. Please note that no refills for any discharge medications will be authorized once you are discharged, as it is imperative that you return to your primary care physician (or establish a relationship with a primary care physician if you do not have  one) for your aftercare needs so that they can reassess your need for medications and monitor your lab values.   Procedures/Studies: Dg Chest 2 View  Result Date: 07/03/2019 CLINICAL DATA:  History of end-stage renal disease and CHF. EXAM: CHEST - 2 VIEW COMPARISON:  Chest radiograph 06/22/2019, 04/28/2019 FINDINGS: Stable cardiomediastinal contours with enlarged heart size. A few minimal linear opacities at the right lung base likely reflect atelectasis or scar. No new focal infiltrate or evidence of edema. No pneumothorax or pleural effusion. No acute findings in the visualized skeleton. IMPRESSION: No evidence of active disease. Electronically Signed   By: Audie Pinto M.D.   On: 07/03/2019 13:57   US Renal  Result Date: 06/23/2019 CLINICAL DATA:  Acute renal failure EXAM: RENAL / URINARY TRACT ULTRASOUND COMPLETE COMPARISON:  None. FINDINGS: Right Kidney: Renal measurements: 11.7 x 4.8 x 6.1 cm = volume: 177 mL . Echogenicity within normal limits. No mass or hydronephrosis visualized. Left Kidney: Renal measurements: 11.1 x 5.3 x 5.9 cm = volume: 181 mL. Echogenicity within normal limits. No mass or hydronephrosis visualized. Bladder: Appears normal for degree of bladder distention. IMPRESSION: Normal bilateral renal ultrasound. Electronically Signed   By: Kathreen Devoid   On: 06/23/2019 11:46   US Venous Img Lower Bilateral  Result Date: 06/22/2019 CLINICAL DATA:  Bilateral lower extremity pain and edema. Evaluate for DVT. EXAM: BILATERAL LOWER EXTREMITY VENOUS DOPPLER ULTRASOUND TECHNIQUE: Gray-scale sonography with graded compression, as well as color Doppler and duplex ultrasound were performed to evaluate the lower extremity deep venous systems from the level of the common femoral vein and including the common femoral, femoral, profunda femoral, popliteal and calf veins including the posterior tibial, peroneal and gastrocnemius veins when visible. The superficial great saphenous vein was  also interrogated. Spectral Doppler was utilized to evaluate flow at rest and with distal augmentation maneuvers in the common femoral, femoral and popliteal veins. COMPARISON:  None. FINDINGS: RIGHT LOWER EXTREMITY Common Femoral Vein: No evidence of thrombus. Normal compressibility, respiratory phasicity and response to augmentation. Saphenofemoral Junction: No evidence of thrombus. Normal compressibility and flow on color Doppler imaging. Profunda Femoral Vein: No evidence of thrombus. Normal compressibility and flow on color Doppler imaging. Femoral Vein: No evidence of thrombus. Normal compressibility, respiratory phasicity and response to augmentation. Popliteal Vein: No evidence of thrombus.  Normal compressibility, respiratory phasicity and response to augmentation. Calf Veins: No evidence of thrombus. Normal compressibility and flow on color Doppler imaging. Superficial Great Saphenous Vein: No evidence of thrombus. Normal compressibility. Venous Reflux:  None. Other Findings:  None. LEFT LOWER EXTREMITY Common Femoral Vein: No evidence of thrombus. Normal compressibility, respiratory phasicity and response to augmentation. Saphenofemoral Junction: No evidence of thrombus. Normal compressibility and flow on color Doppler imaging. Profunda Femoral Vein: No evidence of thrombus. Normal compressibility and flow on color Doppler imaging. Femoral Vein: No evidence of thrombus. Normal compressibility, respiratory phasicity and response to augmentation. Popliteal Vein: No evidence of thrombus. Normal compressibility, respiratory phasicity and response to augmentation. Calf Veins: No evidence of thrombus. Normal compressibility and flow on color Doppler imaging. Superficial Great Saphenous Vein: No evidence of thrombus. Normal compressibility. Venous Reflux:  None. Other Findings:  None. IMPRESSION: No evidence of DVT within either lower extremity. Electronically Signed   By: Sandi Mariscal M.D.   On: 06/22/2019 14:52    Ir Fluoro Guide Cv Line Right  Result Date: 07/08/2019 INDICATION: 48 year old male with a history of renal failure EXAM: TUNNELED CENTRAL VENOUS HEMODIALYSIS CATHETER PLACEMENT WITH ULTRASOUND AND FLUOROSCOPIC GUIDANCE MEDICATIONS: 2 g Ancef. The antibiotic was given in an appropriate time interval prior to skin puncture. ANESTHESIA/SEDATION: Moderate (conscious) sedation was employed during this procedure. A total of Versed 2 mg and Fentanyl 100 mcg was administered intravenously. Moderate Sedation Time: 15 minutes. The patient's level of consciousness and vital signs were monitored continuously by radiology nursing throughout the procedure under my direct supervision. FLUOROSCOPY TIME:  Fluoroscopy Time: 0 minutes 48 seconds (7 mGy). COMPLICATIONS: None PROCEDURE: Informed written consent was obtained from the patient after a discussion of the risks, benefits, and alternatives to treatment. Questions regarding the procedure were encouraged and answered. The right neck and chest were prepped with chlorhexidine in a sterile fashion, and a sterile drape was applied covering the operative field. Maximum barrier sterile technique with sterile gowns and gloves were used for the procedure. A timeout was performed prior to the initiation of the procedure. After creating a small venotomy incision, a micropuncture kit was utilized to access the right internal jugular vein under direct, real-time ultrasound guidance after the overlying soft tissues were anesthetized with 1% lidocaine with epinephrine. Ultrasound image documentation was performed. The microwire was marked to measure appropriate internal catheter length. External tunneled length was estimated. A total tip to cuff length of 23 cm was selected. Skin and subcutaneous tissues of chest wall below the clavicle were generously infiltrated with 1% lidocaine for local anesthesia. A small stab incision was made with 11 blade scalpel. The selected hemodialysis  catheter was tunneled in a retrograde fashion from the anterior chest wall to the venotomy incision. A guidewire was advanced to the level of the IVC and the micropuncture sheath was exchanged for a peel-away sheath. The catheter was then placed through the peel-away sheath with tips ultimately positioned within the superior aspect of the right atrium. Final catheter positioning was confirmed and documented with a spot radiographic image. The catheter aspirates and flushes normally. The catheter was flushed with appropriate volume heparin dwells. The catheter exit site was secured with a 0-Prolene retention suture. The venotomy incision was closed Derma bond and sterile dressing. Dressings were applied at the chest wall. Patient tolerated the procedure well and remained hemodynamically stable throughout. No complications were encountered and no significant blood loss encountered. IMPRESSION: Status post right IJ tunneled hemodialysis catheter. Catheter ready for  use. Signed, Dulcy Fanny. Dellia Nims, RPVI [facility] Radiology Electronically Signed   By: Corrie Mckusick D.O.   On: 07/08/2019 13:24   Ir US Guide Vasc Access Right  Result Date: 07/08/2019 INDICATION: 48 year old male with a history of renal failure EXAM: TUNNELED CENTRAL VENOUS HEMODIALYSIS CATHETER PLACEMENT WITH ULTRASOUND AND FLUOROSCOPIC GUIDANCE MEDICATIONS: 2 g Ancef. The antibiotic was given in an appropriate time interval prior to skin puncture. ANESTHESIA/SEDATION: Moderate (conscious) sedation was employed during this procedure. A total of Versed 2 mg and Fentanyl 100 mcg was administered intravenously. Moderate Sedation Time: 15 minutes. The patient's level of consciousness and vital signs were monitored continuously by radiology nursing throughout the procedure under my direct supervision. FLUOROSCOPY TIME:  Fluoroscopy Time: 0 minutes 48 seconds (7 mGy). COMPLICATIONS: None PROCEDURE: Informed  written consent was obtained from the patient after a discussion of the risks, benefits, and alternatives to treatment. Questions regarding the procedure were encouraged and answered. The right neck and chest were prepped with chlorhexidine in a sterile fashion, and a sterile drape was applied covering the operative field. Maximum barrier sterile technique with sterile gowns and gloves were used for the procedure. A timeout was performed prior to the initiation of the procedure. After creating a small venotomy incision, a micropuncture kit was utilized to access the right internal jugular vein under direct, real-time ultrasound guidance after the overlying soft tissues were anesthetized with 1% lidocaine with epinephrine. Ultrasound image documentation was performed. The microwire was marked to measure appropriate internal catheter length. External tunneled length was estimated. A total tip to cuff length of 23 cm was selected. Skin and subcutaneous tissues of chest wall below the clavicle were generously infiltrated with 1% lidocaine for local anesthesia. A small stab incision was made with 11 blade scalpel. The selected hemodialysis catheter was tunneled in a retrograde fashion from the anterior chest wall to the venotomy incision. A guidewire was advanced to the level of the IVC and the micropuncture sheath was exchanged for a peel-away sheath. The catheter was then placed through the peel-away sheath with tips ultimately positioned within the superior aspect of the right atrium. Final catheter positioning was confirmed and documented with a spot radiographic image. The catheter aspirates and flushes normally. The catheter was flushed with appropriate volume heparin dwells. The catheter exit site was secured with a 0-Prolene retention suture. The venotomy incision was closed Derma bond and sterile dressing. Dressings were applied at the chest wall. Patient tolerated the procedure well and remained hemodynamically  stable throughout. No complications were encountered and no significant blood loss encountered. IMPRESSION: Status post right IJ tunneled hemodialysis catheter. Catheter ready for use. Signed, Dulcy Fanny. Dellia Nims, RPVI [facility] Radiology Electronically Signed   By: Corrie Mckusick D.O.   On: 07/08/2019 13:24   Dg Chest Port 1 View  Result Date: 06/22/2019 CLINICAL DATA:  Pt c/o swelling in lower extremities x several days. Hx CHF, obesity, diabetes, nonsmoker EXAM: PORTABLE CHEST 1 VIEW COMPARISON:  Chest radiographs dated 04/28/2019, 02/23/2019 FINDINGS: Stable cardiomediastinal contours with enlarged heart size. Central vascular congestion without overt edema. No pneumothorax or pleural effusion. Visualized skeleton is unremarkable. IMPRESSION: Cardiomegaly and central vascular congestion without overt edema. Electronically Signed   By: Audie Pinto M.D.   On: 06/22/2019 10:53   Dg Hand Complete Left  Result Date: 07/07/2019 CLINICAL DATA:  Pain and swelling, first and second metacarpals. EXAM: LEFT HAND - COMPLETE 3+ VIEW COMPARISON:  None. FINDINGS: No fracture or dislocation of the left hand. Joint spaces are well preserved. Diffuse soft tissue edema about the hand. IMPRESSION: No fracture or dislocation of the left hand. Joint spaces are well preserved. Diffuse soft tissue edema about the hand. Electronically Signed   By: Eddie Candle M.D.   On: 07/07/2019 21:18     The results of significant diagnostics from this hospitalization (including imaging, microbiology, ancillary and laboratory) are listed below for reference.     Microbiology: No results found for this or any previous visit (from the past 240 hour(s)).   Labs: BNP (last 3 results) Recent Labs    04/28/19 0933 05/05/19 0114 06/22/19 1053  BNP 85.0 80.0 222.9*   Basic Metabolic Panel: Recent Labs  Lab 07/12/19 0510 07/13/19 0456 07/14/19 0400 07/15/19 0201  07/15/19 0555  NA 134* 133* 131* 129* 134*  K 3.7 4.3 4.4 4.4 4.0  CL 92* 95* 91* 90* 97*  CO2 '23 23 22 22 23  '$ GLUCOSE 170* 147* 150* 196* 144*  BUN 92* 62* 89* 106* 46*  CREATININE 6.64* 6.10* 7.77* 8.17* 4.61*  CALCIUM 8.9 8.7* 8.9 9.1 8.7*  PHOS 8.7* 7.6* 9.0* 9.2* 4.7*   Liver Function Tests: Recent Labs  Lab 07/12/19 0510 07/13/19 0456 07/14/19 0400 07/15/19 0201 07/15/19 0555  ALBUMIN 2.2* 2.3* 2.4* 2.5* 2.5*   No results for input(s): LIPASE, AMYLASE in the last 168 hours. No results for input(s): AMMONIA in the last 168 hours. CBC: Recent Labs  Lab 07/10/19 0412 07/12/19 0759 07/15/19 0200  WBC 8.3 8.8 9.6  NEUTROABS  --   --  5.0  HGB 9.7* 10.1* 10.7*  HCT 30.2* 31.5* 33.4*  MCV 76.1* 77.4* 77.0*  PLT 481* 489* 500*   Cardiac Enzymes: No results for input(s): CKTOTAL, CKMB, CKMBINDEX, TROPONINI in the last 168 hours. BNP: Invalid input(s): POCBNP CBG: Recent Labs  Lab 07/15/19 1132 07/15/19 1629 07/15/19 2047 07/16/19 0648 07/16/19 1105  GLUCAP 182* 204* 228* 126* 168*   D-Dimer No results for input(s): DDIMER in the last 72 hours. Hgb A1c No results for input(s): HGBA1C in the last 72 hours. Lipid Profile No results for input(s): CHOL, HDL, LDLCALC, TRIG, CHOLHDL, LDLDIRECT in the last 72 hours. Thyroid function studies No results for input(s): TSH, T4TOTAL, T3FREE, THYROIDAB in the last 72 hours.  Invalid input(s): FREET3 Anemia work up No results for input(s): VITAMINB12, FOLATE, FERRITIN, TIBC, IRON, RETICCTPCT in the last 72 hours. Urinalysis    Component Value Date/Time   COLORURINE STRAW (A) 06/23/2019 0500   APPEARANCEUR CLEAR 06/23/2019 0500   LABSPEC 1.010 06/23/2019 0500   PHURINE 6.0 06/23/2019 0500   GLUCOSEU >=500 (A) 06/23/2019 0500   HGBUR SMALL (A) 06/23/2019 0500   BILIRUBINUR NEGATIVE 06/23/2019 0500   KETONESUR NEGATIVE 06/23/2019 0500   PROTEINUR >=300 (A) 06/23/2019 0500   UROBILINOGEN 0.2 06/11/2012 0518    NITRITE NEGATIVE 06/23/2019 0500   LEUKOCYTESUR NEGATIVE 06/23/2019 0500   Sepsis Labs Invalid input(s): PROCALCITONIN,  WBC,  LACTICIDVEN Microbiology No results found for this or any previous visit (from the past 240 hour(s)).   Time coordinating discharge:  I have spent 35 minutes face to face with the patient and on the ward discussing the patients care, assessment, plan and disposition with other care givers. >50% of the time was devoted counseling the patient about the risks and benefits of treatment/Discharge disposition and coordinating care.   SIGNED:   Damita Lack, MD  [facility] 07/16/2019, 11:12 AM  If 7PM-7AM, please contact night-coverage [email or web address]

## 2019-07-16 NOTE — Progress Notes (Signed)
To rehab alert and oriented patient per bed accompanied by NT and RN. Oriented to unit set up. Fall precaution done and signed by patient.

## 2019-07-16 NOTE — Plan of Care (Signed)
  Problem: Education: Goal: Knowledge of General Education information will improve Description: Including pain rating scale, medication(s)/side effects and non-pharmacologic comfort measures Outcome: Progressing   Problem: Pain Managment: Goal: General experience of comfort will improve Outcome: Progressing   

## 2019-07-17 ENCOUNTER — Inpatient Hospital Stay (HOSPITAL_COMMUNITY): Payer: Medicare Other

## 2019-07-17 DIAGNOSIS — R52 Pain, unspecified: Secondary | ICD-10-CM | POA: Diagnosis present

## 2019-07-17 LAB — RENAL FUNCTION PANEL
Albumin: 2.6 g/dL — ABNORMAL LOW (ref 3.5–5.0)
Anion gap: 16 — ABNORMAL HIGH (ref 5–15)
BUN: 87 mg/dL — ABNORMAL HIGH (ref 6–20)
CO2: 24 mmol/L (ref 22–32)
Calcium: 9.1 mg/dL (ref 8.9–10.3)
Chloride: 93 mmol/L — ABNORMAL LOW (ref 98–111)
Creatinine, Ser: 7.16 mg/dL — ABNORMAL HIGH (ref 0.61–1.24)
GFR calc Af Amer: 9 mL/min — ABNORMAL LOW (ref >60)
GFR calc non Af Amer: 8 mL/min — ABNORMAL LOW (ref >60)
Glucose, Bld: 140 mg/dL — ABNORMAL HIGH (ref 70–99)
Phosphorus: 7.7 mg/dL — ABNORMAL HIGH (ref 2.5–4.6)
Potassium: 4.5 mmol/L (ref 3.5–5.1)
Sodium: 133 mmol/L — ABNORMAL LOW (ref 135–145)

## 2019-07-17 LAB — URIC ACID: Uric Acid, Serum: 7.1 mg/dL (ref 3.7–8.6)

## 2019-07-17 LAB — GLUCOSE, CAPILLARY
Glucose-Capillary: 116 mg/dL — ABNORMAL HIGH (ref 70–99)
Glucose-Capillary: 146 mg/dL — ABNORMAL HIGH (ref 70–99)
Glucose-Capillary: 146 mg/dL — ABNORMAL HIGH (ref 70–99)
Glucose-Capillary: 181 mg/dL — ABNORMAL HIGH (ref 70–99)

## 2019-07-17 MED ORDER — HEPARIN SODIUM (PORCINE) 1000 UNIT/ML IJ SOLN
2000.0000 [IU] | Freq: Once | INTRAMUSCULAR | Status: AC
  Administered 2019-07-17: 2000 [IU] via INTRAVENOUS

## 2019-07-17 MED ORDER — HEPARIN SODIUM (PORCINE) 1000 UNIT/ML IJ SOLN
INTRAMUSCULAR | Status: AC
  Filled 2019-07-17: qty 2

## 2019-07-17 NOTE — Evaluation (Signed)
Occupational Therapy Assessment and Plan  Patient Details  Name: Kelly Key MRN: [id number] Date of Birth: May 10, 1971  OT Diagnosis: muscle weakness (generalized) Rehab Potential: Rehab Potential (ACUTE ONLY): Good ELOS: 14-18 days   Today's Date: 07/17/2019 OT Individual Time: 0730-0900 OT Individual Time Calculation (min): 90 min     Problem List:  Patient Active Problem List   Diagnosis Date Noted  . Physical debility 07/16/2019  . ESRD on dialysis ([place])   . Anemia of chronic disease   . Uncontrolled type 2 diabetes mellitus with hyperglycemia ([place])   . Congestive heart failure ([place])   . Volume overload 07/05/2019  . Anasarca 07/03/2019  . Dyslipidemia 07/03/2019  . Chronic combined systolic and diastolic CHF (congestive heart failure) ([place]) 07/03/2019  . Bilateral leg edema   . CKD (chronic kidney disease) stage 5, GFR less than 15 ml/min (HCC) 06/22/2019  . Uncontrolled type 2 diabetes mellitus with hyperglycemia, with long-term current use of insulin ([place]) 06/22/2019  . Hypokalemia   . Thromboembolism ([place]) 02/20/2019  . AKI (acute kidney injury) ([place]) 02/20/2019  . Acute on chronic diastolic CHF (congestive heart failure) ([place]) 03/26/2018  . Acute respiratory failure with hypoxia ([place]) 03/26/2018  . Hyponatremia 06/12/2012  . Microcytic anemia 06/12/2012  . HTN (hypertension) 06/11/2012  . Elevated LFTs 06/11/2012  . Morbid obesity ([place]) 06/11/2012  . Hepatic steatosis 06/11/2012    Past Medical History:  Past Medical History:  Diagnosis Date  . Acute diastolic CHF (congestive heart failure) ([place]) 06/11/2012   EF 50-55% [facility])  . Diabetes mellitus    A1c 11.5 (06/11/2012).  . Gout   . Hepatic steatosis 06/11/2012   Elevated LFTs  . Hyperlipemia   . Malignant hypertension   . Microcytic anemia 06/12/2012  . Obesity    Past Surgical History:  Past Surgical History:  Procedure Laterality Date  . IR FLUORO GUIDE CV LINE RIGHT  07/08/2019  . IR US  GUIDE VASC ACCESS RIGHT  07/08/2019  . NO PAST SURGERIES      Assessment & Plan Clinical Impression: [name] is a 48 year old male with history of T2DM--poorly controlled, malignant hypertension, morbid obesity, CKD stage IV who was recently admitted to [facility] 8/26- 06/27/19  2-week history of worsening of peripheral edema and abdominal girth.  History taken from chart review and patient.  He was started aggressive IV diuresis for acute on chronic renal failure with anasarca and Nephrology recommended dialysis however patient adamantly declined this and preferred to go home with increase in diuretics.  He was readmitted on 07/03/2019 due to increasing lower extremity edema, difficulty with ambulation and lying flat as well as foot pain.  He ultimately was agreeable to dialysis.  Tunneled HD catheter placed by [name] on 07/08/2019 and hemodialysis initiated.  Blood pressures remain labile and MRA renal arteries ordered to rule out stenosis but nephrology doubts any intervention would make a difference in renal status.  To follow-up with [name] after discharge he continues to refuse placement of permanent access.  Acute on chronic diastolic failure improving with dialysis with decrease in anasarca.  He continues to have issues with debility affecting ADLs and mobility.  CIR recommended due to recent functional decline.   Patient transferred to CIR on 07/16/2019 .    Patient currently requires mod with basic self-care skills secondary to muscle weakness, decreased cardiorespiratoy endurance, decreased memory and decreased standing balance and decreased balance strategies.  Prior to hospitalization, patient could complete ADLs with modified independent .  Patient will benefit from skilled intervention to decrease level of assist with basic self-care skills prior to discharge home with care partner.  Anticipate patient will require intermittent supervision and follow up home health.  OT - End of  Session Activity Tolerance: Tolerates 10 - 20 min activity with multiple rests Endurance Deficit: Yes Endurance Deficit Description: generalized deconditioning OT Assessment Rehab Potential (ACUTE ONLY): Good OT Barriers to Discharge: Decreased caregiver support OT Patient demonstrates impairments in the following area(s): Balance;Edema;Endurance;Motor;Pain;Sensory OT Basic ADL's Functional Problem(s): Bathing;Dressing;Toileting OT Transfers Functional Problem(s): Toilet;Tub/Shower OT Additional Impairment(s): None OT Plan OT Intensity: Minimum of 1-2 x/day, 45 to 90 minutes OT Frequency: 5 out of 7 days OT Duration/Estimated Length of Stay: 14 days OT Treatment/Interventions: Balance/vestibular training;Self Care/advanced ADL retraining;Therapeutic Exercise;UE/LE Strength taining/ROM;Pain management;DME/adaptive equipment instruction;Community reintegration;Discharge planning;UE/LE Coordination activities;Patient/family education;Therapeutic Activities;Psychosocial support;Functional mobility training OT Self Feeding Anticipated Outcome(s): no goal set OT Basic Self-Care Anticipated Outcome(s): (S) OT Toileting Anticipated Outcome(s): (S) OT Bathroom Transfers Anticipated Outcome(s): (S) OT Recommendation Patient destination: Home Follow Up Recommendations: Home health OT Equipment Recommended: To be determined   Skilled Therapeutic Intervention Skilled OT Evaluation completed. Pt presents to OT with generalized weakness and deconditioning that impacts his ability to perform ADLs with PLOF. Pt is motivated to return home but is having some difficulty coping with new HD and quality of life. Time spent discussing these things with pt and promoting self efficacy in making life decisions for himself. Pt completed bed mobility with CGA to EOB with use of bed features. He required mod A for stand pivot transfer and min A to power up from EOB. Pt demonstrated good safety awareness throughout  session with some very mild STM deficits as evidenced on the BIMS evaluation. Pt reported vision has gotten " a bit more blurry" but assessment was all WFL. Pt required mod A to don pants ( to power up and pull up in standing) and min A to don shirt. Pt declined bathing. Pt edu on ELOS, OT POC, and rehab safety expectations. Pt was left sitting up in the recliner with all needs met. Chair pad alarm set.   OT Evaluation Precautions/Restrictions  Precautions Precautions: Fall Restrictions Weight Bearing Restrictions: No General Chart Reviewed: Yes Family/Caregiver Present: No Pain Pain Assessment Pain Scale: 0-10 Pain Score: 0-No pain Home Living/Prior Functioning Home Living Family/patient expects to be discharged to:: Private residence Living Arrangements: Spouse/significant other Available Help at Discharge: Friend(s) Type of Home: House Home Access: Level entry Home Layout: One level Bathroom Shower/Tub: Chiropodist: Standard Bathroom Accessibility: Yes  Lives With: Significant other IADL History Homemaking Responsibilities: Yes Meal Prep Responsibility: Secondary Laundry Responsibility: Secondary Cleaning Responsibility: Secondary Bill Paying/Finance Responsibility: Primary Shopping Responsibility: Secondary Current License: Yes Mode of Transportation: Car Occupation: Retired Prior Function Level of Independence: Independent with basic ADLs, Requires assistive device for independence(occasionally uses RW) Driving: Yes Vocation: Retired Comments: Hydrographic surveyor, drives ADL   Vision Baseline Vision/History: No visual deficits Patient Visual Report: Blurring of vision Vision Assessment?: Yes, WFL Perception  Perception: Within Functional Limits Praxis Praxis: Intact Cognition Overall Cognitive Status: Within Functional Limits for tasks assessed Arousal/Alertness: Awake/alert Orientation Level: Situation;Place;Person Person:  Oriented Place: Oriented Situation: Oriented Year: 2020 Month: September Day of Week: Correct Awareness: Appears intact Problem Solving: Appears intact Safety/Judgment: Appears intact Sensation Sensation Light Touch: Impaired Detail Central sensation comments: Reports numbness in his hands Coordination Gross Motor Movements are Fluid and Coordinated: No Fine Motor Movements are Fluid and Coordinated: No Coordination  and Movement Description: generalized weakness. Pt with tremor at rest- reports he may "just be cold" Motor  Motor Motor: Other (comment) Motor - Skilled Clinical Observations: generalized weakness Mobility  Bed Mobility Bed Mobility: Supine to Sit Supine to Sit: Contact Guard/Touching assist Transfers Sit to Stand: Minimal Assistance - Patient > 75% Stand to Sit: Minimal Assistance - Patient > 75%  Trunk/Postural Assessment  Cervical Assessment Cervical Assessment: Within Functional Limits Thoracic Assessment Thoracic Assessment: Exceptions to WFL(rounded shoulders) Lumbar Assessment Lumbar Assessment: Exceptions to WFL(posterior pelvic tilt) Postural Control Postural Control: Deficits on evaluation Righting Reactions: delayed  Balance Balance Balance Assessed: Yes Static Sitting Balance Static Sitting - Balance Support: Feet supported Static Sitting - Level of Assistance: 5: Stand by assistance Dynamic Sitting Balance Dynamic Sitting - Balance Support: Feet supported Dynamic Sitting - Level of Assistance: 5: Stand by assistance Static Standing Balance Static Standing - Balance Support: Bilateral upper extremity supported Static Standing - Level of Assistance: 4: Min assist Dynamic Standing Balance Dynamic Standing - Balance Support: Bilateral upper extremity supported Dynamic Standing - Level of Assistance: 3: Mod assist Extremity/Trunk Assessment RUE Assessment RUE Assessment: Exceptions to [facility] General Strength Comments: Generalized weakness.  4-/5 in the UE generally. Weak gross grasp. R hand swollen, reports fall PTA, painful wrist. LUE Assessment LUE Assessment: Exceptions to [facility] General Strength Comments: Generalized weakness. 4-/5 in the UE generally. Weak gross grasp     Refer to Care Plan for Long Term Goals  Recommendations for other services: Neuropsych and Therapeutic Recreation  Stress management   Discharge Criteria: Patient will be discharged from OT if patient refuses treatment 3 consecutive times without medical reason, if treatment goals not met, if there is a change in medical status, if patient makes no progress towards goals or if patient is discharged from hospital.  The above assessment, treatment plan, treatment alternatives and goals were discussed and mutually agreed upon: by patient  Curtis Sites 07/17/2019, 8:43 AM

## 2019-07-17 NOTE — Progress Notes (Signed)
[place] PHYSICAL MEDICINE & REHABILITATION PROGRESS NOTE  Subjective/Complaints: Patient seen sitting up in his chair working with therapies this morning.  He states he slept well overnight.  He notes right wrist pain.  ROS: + Right wrist pain.  Denies CP, shortness of breath, nausea, vomiting, diarrhea.  Objective: Vital Signs: Blood pressure 125/77, pulse 75, temperature (!) 97.5 F (36.4 C), temperature source Oral, resp. rate 16, height 5\' 7"  (1.702 m), weight 135.1 kg, SpO2 92 %. No results found. Recent Labs    07/15/19 0200 07/16/19 1640  WBC 9.6 8.6  HGB 10.7* 10.1*  HCT 33.4* 32.5*  PLT 500* 407*   Recent Labs    07/15/19 0555 07/17/19 0649  NA 134* 133*  K 4.0 4.5  CL 97* 93*  CO2 23 24  GLUCOSE 144* 140*  BUN 46* 87*  CREATININE 4.61* 7.16*  CALCIUM 8.7* 9.1    Physical Exam: BP 125/77 (BP Location: Left Arm)   Pulse 75   Temp (!) 97.5 F (36.4 C) (Oral)   Resp 16   Ht 5\' 7"  (1.702 m)   Wt 135.1 kg   SpO2 92%   BMI 46.65 kg/m  Constitutional: No distress . Vital signs reviewed.  Morbidly obese. HENT: Normocephalic.  Atraumatic. Eyes: EOMI. No discharge. Cardiovascular: No JVD. Respiratory: Normal effort.  No stridor. GI: Non-distended. Skin: Warm and dry.  Intact. Psych: Normal mood.  Normal behavior. Musc: TTP right wrist with edema.  No erythema. Neurological: Alert Bilateral upper extremities: 4/5 proximal distal Bilateral lower extremities: 4-/5 proximal distal  Skin: Vascular changes bilateral lower extremities  Psychiatric: His speech is normal. His affect is blunt.   Assessment/Plan: 1. Functional deficits secondary to debility which require 3+ hours per day of interdisciplinary therapy in a comprehensive inpatient rehab setting.  Physiatrist is providing close team supervision and 24 hour management of active medical problems listed below.  Physiatrist and rehab team continue to assess barriers to discharge/monitor patient  progress toward functional and medical goals  Care Tool:  Bathing  Bathing activity did not occur: Safety/medical concerns Body parts bathed by patient: Right lower leg, Left lower leg, Front perineal area, Buttocks         Bathing assist Assist Level: Minimal Assistance - Patient > 75%     Upper Body Dressing/Undressing Upper body dressing   What is the patient wearing?: Pull over shirt    Upper body assist Assist Level: Minimal Assistance - Patient > 75%    Lower Body Dressing/Undressing Lower body dressing    Lower body dressing activity did not occur: N/A What is the patient wearing?: Underwear/pull up, Pants     Lower body assist Assist for lower body dressing: Moderate Assistance - Patient 50 - 74%     Toileting Toileting Toileting Activity did not occur (Clothing management and hygiene only): N/A (no void or bm)  Toileting assist Assist for toileting: Moderate Assistance - Patient 50 - 74%     Transfers Chair/bed transfer  Transfers assist     Chair/bed transfer assist level: Moderate Assistance - Patient 50 - 74%     Locomotion Ambulation   Ambulation assist      Assist level: 2 helpers(mod +2 for w/c follow L LE weakness) Assistive device: Walker-rolling Max distance: 6 ft   Walk 10 feet activity   Assist  Walk 10 feet activity did not occur: Safety/medical concerns        Walk 50 feet activity   Assist Walk 50 feet with  2 turns activity did not occur: Safety/medical concerns         Walk 150 feet activity   Assist Walk 150 feet activity did not occur: Safety/medical concerns         Walk 10 feet on uneven surface  activity   Assist Walk 10 feet on uneven surfaces activity did not occur: Safety/medical concerns         Wheelchair     Assist Will patient use wheelchair at discharge?: No             Wheelchair 50 feet with 2 turns activity    Assist            Wheelchair 150 feet activity      Assist            Medical Problem List and Plan: 1.  Generalized weakness with limitations in self-care secondary to debility  Begin CIR evaluations 2.  Antithrombotics: -DVT/anticoagulation:  Pharmaceutical: Heparin             -antiplatelet therapy: On ASA 81 mg daily 3. Pain Management: On gabapentin 3 times daily for neuropathy with hydrocodone on PRN basis             Monitor with increased mobility 4. Mood: LCSW to follow for evaluation and support             -antipsychotic agents: N/A 5. Neuropsych: This patient is capable of making decisions on his own behalf. 6. Skin/Wound Care: Routine pressure relief measures 7. Fluids/Electrolytes/Nutrition: CM/renal diet with 1200 cc FR.  8.  T2DM with hyperglycemia: Hemoglobin A1c-12.4 and poorly controlled.  Continue Levemir twice daily and use SSI for tighter blood sugar control.  Monitor blood sugars AC/HS.              Monitor with increased mobility. 9.  Malignant hypertension: Monitor BP TID--follow-up with [name] for work-up to rule out RAS. Continue amlodipine, Coreg twice daily, clonidine TID,  IV furosemide BID             Monitor with increased mobility. 10.  Anemia of chronic disease: Received IV iron             Labs with HD  Hemoglobin 10.1 on 9/19 11.  ESRD/chronic combined CHF: Anasarca multifactorial and improving with hemodialysis.             Daily weights Filed Weights   07/16/19 1428 07/17/19 0436  Weight: 95 kg 135.1 kg   Unreliable recordings 12.  Right wrist pain  X-ray ordered  Patient also states she has a history of gout, will order uric acid   Will consider empiric treatment for gout  LOS: 1 days A FACE TO FACE EVALUATION WAS PERFORMED  Gary Bultman Lorie Phenix 07/17/2019, 2:45 PM

## 2019-07-17 NOTE — Progress Notes (Signed)
Occupational Therapy Session Note  Patient Details  Name: Kelly Key MRN: [id number] Date of Birth: 09-10-1971  Today's Date: 07/17/2019 OT Individual Time: 1600-1630 OT Individual Time Calculation (min): 30 min    Short Term Goals: Week 1:  OT Short Term Goal 1 (Week 1): Pt will don pants with min A OT Short Term Goal 2 (Week 1): Pt will complete sit <> stand with min A OT Short Term Goal 3 (Week 1): Pt will transfer to [facility] with min A OT Short Term Goal 4 (Week 1): Pt will demonstrate increased endurance and stand for 1 grooming activity with min A  Skilled Therapeutic Interventions/Progress Updates:    Pt received supine with no c/o pain. Pt completed bed mobility to EOB with (S), heavy use of bed features. Pt completed sit >stand with RW with min A from elevated EOB. Pt completed stand pivot transfer with mod A. Pt was taken down to the therapy gym for time management. Pt completed BUE strengthening circuit with a 3lb dowel. Pt instructed in exercises focused on deltoids, triceps, and scapular depressors to increase independence in transfers. Pt used dynamometer for grip strength testing- peculiar results in that during MMT pt could not squeeze therapist's hands greater than a 3/5, but scored 50 lbs on R and L.  Pt returned to his room and was left supine with all needs met, fiance present.   Therapy Documentation Precautions:  Precautions Precautions: Fall Restrictions Weight Bearing Restrictions: No   Therapy/Group: Individual Therapy  Curtis Sites 07/17/2019, 4:49 PM

## 2019-07-17 NOTE — Progress Notes (Signed)
[facility]    NEPHROLOGY PROGRESS NOTE  SUBJECTIVE:  Dialysis early this AM- just coming back.     OBJECTIVE:  Vitals:   07/16/19 2032 07/17/19 0436  BP: 138/89 (!) 161/97  Pulse: 72 75  Resp: 14 18  Temp: (!) 97.5 F (36.4 C) (!) 97.5 F (36.4 C)  SpO2: 95% 100%    Intake/Output Summary (Last 24 hours) at 07/17/2019 1310 Last data filed at 07/17/2019 0813 Gross per 24 hour  Intake 497 ml  Output 850 ml  Net -353 ml      General:  AAOx3 NAD, transferring to bed from chair Neck:  No JVD CV:  Heart RRR  Lungs: clear anteriorly Abd:  abd SNT/ND with normal BS Extremities: +1 bilateral lower extremity edema. ACCESS: R IJ TDC   MEDICATIONS:  . allopurinol  200 mg Oral Daily  . amLODipine  10 mg Oral Daily  . aspirin EC  81 mg Oral Daily  . atorvastatin  80 mg Oral Daily  . carvedilol  25 mg Oral BID WC  . Chlorhexidine Gluconate Cloth  6 each Topical Q0600  . cloNIDine  0.3 mg Oral TID  . ezetimibe  10 mg Oral QPM  . fluticasone  1 spray Each Nare Daily  . furosemide  40 mg Oral BID  . gabapentin  100 mg Oral TID  . heparin  5,000 Units Subcutaneous Q8H  . insulin aspart  0-20 Units Subcutaneous TID WC  . insulin aspart  0-5 Units Subcutaneous QHS  . insulin detemir  35 Units Subcutaneous BID  . linaclotide  145 mcg Oral QAC breakfast  . sevelamer carbonate  800 mg Oral TID WC       LABS:   CBC Latest Ref Rng & Units 07/16/2019 07/15/2019 07/12/2019  WBC 4.0 - 10.5 K/uL 8.6 9.6 8.8  Hemoglobin 13.0 - 17.0 g/dL 10.1(L) 10.7(L) 10.1(L)  Hematocrit 39.0 - 52.0 % 32.5(L) 33.4(L) 31.5(L)  Platelets 150 - 400 K/uL 407(H) 500(H) 489(H)    CMP Latest Ref Rng & Units 07/17/2019 07/15/2019 07/15/2019  Glucose 70 - 99 mg/dL 140(H) 144(H) 196(H)  BUN 6 - 20 mg/dL 87(H) 46(H) 106(H)  Creatinine 0.61 - 1.24 mg/dL 7.16(H) 4.61(H) 8.17(H)  Sodium 135 - 145 mmol/L 133(L) 134(L) 129(L)  Potassium 3.5 - 5.1 mmol/L 4.5 4.0 4.4  Chloride 98 - 111 mmol/L 93(L)  97(L) 90(L)  CO2 22 - 32 mmol/L 24 23 22   Calcium 8.9 - 10.3 mg/dL 9.1 8.7(L) 9.1  Total Protein 6.5 - 8.1 g/dL - - -  Total Bilirubin 0.3 - 1.2 mg/dL - - -  Alkaline Phos 38 - 126 U/L - - -  AST 15 - 41 U/L - - -  ALT 0 - 44 U/L - - -    Lab Results  Component Value Date   CALCIUM 9.1 07/17/2019   PHOS 7.7 (H) 07/17/2019       Component Value Date/Time   COLORURINE STRAW (A) 06/23/2019 0500   APPEARANCEUR CLEAR 06/23/2019 0500   LABSPEC 1.010 06/23/2019 0500   PHURINE 6.0 06/23/2019 0500   GLUCOSEU >=500 (A) 06/23/2019 0500   HGBUR SMALL (A) 06/23/2019 0500   BILIRUBINUR NEGATIVE 06/23/2019 0500   KETONESUR NEGATIVE 06/23/2019 0500   PROTEINUR >=300 (A) 06/23/2019 0500   UROBILINOGEN 0.2 06/11/2012 0518   NITRITE NEGATIVE 06/23/2019 0500   LEUKOCYTESUR NEGATIVE 06/23/2019 0500   No results found for: PHART, PCO2ART, PO2ART, HCO3, TCO2, ACIDBASEDEF, O2SAT     Component Value Date/Time  IRON 125 07/09/2019 1312   TIBC 260 07/09/2019 1312   FERRITIN 449 (H) 07/09/2019 1312   IRONPCTSAT 48 (H) 07/09/2019 1312       ASSESSMENT/PLAN:    Kelly Key an 48 y.o.malewitha PMH significant for dCHF, DM, HTN, HLD, morbid obesity, fatty liver disease, and CKD stage 4 followed by Dr. Lowanda Foster with nephrotic range proteinuria (documented in past as >11 grams/24 hours) who was recently hospitalized on 06/22/2019 [facility] with a 2 week history of worsening lower extremity edema and abdominal girth not responsive to increasing doses of torsemide as an outpatient. He has had accelerated HTN and workup for renal artery stenosis was underway with [name] however that has not been scheduled.During that hospitalization there were extensive discussions with the pt educating him about the need to initiate dialysis but he refused wanting to give a trial with outpt diuretics. In the past Kelly Key hadexpressed that he would "rather die than go on dialysis" and is followed by Dr. Lowanda Foster,  however on further discussion he always states that he's not ready to die. Patient went home with torsemide 60mg  BID and unfortunately did not respond even though he was compliant.   1.  End-stage renal disease. Status post [facility] 07/08/2019 and first HD.  3rd HD 9/15, now on TTS.  Discussed permanent access again with him today--> he still wants to hold off but it seems like he's more receptive to discussions.  Will keep trying.  2.  Hypertension.  Should improve with ultrafiltration.  On home dose of amlodipine.  3.  Acute on chronic diastolic congestive heart failure. Improving with dialysis.  4.  Malignant hypertension.  Will monitor blood pressure with dialysis.  Doubt intervention on any significant renal artery stenosis would make a difference in his renal function.    5.  Diabetes mellitus.  Overall poor control with a hemoglobin A1c of 12.4.  6.  Volume overload.  Should improve with ultrafiltration on dialysis.  7.  Anemia.  Is iron replete.  Hgb 10.1, will follow and dose Aranesp as appropriate- no ESA yet.    8.  BMD. Cont  phosphate binders.  9.  To CIR, CLIP

## 2019-07-17 NOTE — Evaluation (Signed)
Physical Therapy Assessment and Plan  Patient Details  Name: Kelly Key MRN: [id number] Date of Birth: 12-30-1970  PT Diagnosis: Abnormal posture, Difficulty walking, Edema and Muscle weakness Rehab Potential: Good ELOS: 2-2.5 weeks   Today's Date: 07/17/2019 PT Individual Time: 1100-1157 PT Individual Time Calculation (min): 57 min    Problem List:  Patient Active Problem List   Diagnosis Date Noted  . Physical debility 07/16/2019  . ESRD on dialysis ([place])   . Anemia of chronic disease   . Uncontrolled type 2 diabetes mellitus with hyperglycemia ([place])   . Congestive heart failure ([place])   . Volume overload 07/05/2019  . Anasarca 07/03/2019  . Dyslipidemia 07/03/2019  . Chronic combined systolic and diastolic CHF (congestive heart failure) ([place]) 07/03/2019  . Bilateral leg edema   . CKD (chronic kidney disease) stage 5, GFR less than 15 ml/min (HCC) 06/22/2019  . Uncontrolled type 2 diabetes mellitus with hyperglycemia, with long-term current use of insulin ([place]) 06/22/2019  . Hypokalemia   . Thromboembolism ([place]) 02/20/2019  . AKI (acute kidney injury) ([place]) 02/20/2019  . Acute on chronic diastolic CHF (congestive heart failure) ([place]) 03/26/2018  . Acute respiratory failure with hypoxia ([place]) 03/26/2018  . Hyponatremia 06/12/2012  . Microcytic anemia 06/12/2012  . HTN (hypertension) 06/11/2012  . Elevated LFTs 06/11/2012  . Morbid obesity ([place]) 06/11/2012  . Hepatic steatosis 06/11/2012    Past Medical History:  Past Medical History:  Diagnosis Date  . Acute diastolic CHF (congestive heart failure) ([place]) 06/11/2012   EF 50-55% [facility])  . Diabetes mellitus    A1c 11.5 (06/11/2012).  . Gout   . Hepatic steatosis 06/11/2012   Elevated LFTs  . Hyperlipemia   . Malignant hypertension   . Microcytic anemia 06/12/2012  . Obesity    Past Surgical History:  Past Surgical History:  Procedure Laterality Date  . IR FLUORO GUIDE CV LINE RIGHT  07/08/2019  . IR US  GUIDE VASC ACCESS RIGHT  07/08/2019  . NO PAST SURGERIES      Assessment & Plan Clinical Impression: Patient is a 48 y.o. year old male with history of T2DM--poorly controlled, malignant hypertension, morbid obesity, CKD stage IV who was recently admitted to [facility] 8/26- 06/27/19  2-week history of worsening of peripheral edema and abdominal girth.  History taken from chart review and patient.  He was started aggressive IV diuresis for acute on chronic renal failure with anasarca and Nephrology recommended dialysis however patient adamantly declined this and preferred to go home with increase in diuretics.  He was readmitted on 07/03/2019 due to increasing lower extremity edema, difficulty with ambulation and lying flat as well as foot pain.  He ultimately was agreeable to dialysis.  Tunneled HD catheter placed by [name] on 07/08/2019 and hemodialysis initiated.  Blood pressures remain labile and MRA renal arteries ordered to rule out stenosis but nephrology doubts any intervention would make a difference in renal status.  To follow-up with [name] after discharge he continues to refuse placement of permanent access.  Acute on chronic diastolic failure improving with dialysis with decrease in anasarca.  He continues to have issues with debility affecting ADLs and mobility.  CIR recommended due to recent functional decline. .  Patient transferred to CIR on 07/16/2019 .   Patient currently requires mod with mobility secondary to muscle weakness, decreased cardiorespiratoy endurance and decreased standing balance and decreased balance strategies.  Prior to hospitalization, patient was modified independent  with mobility and lived with Significant  other in a House home.  Home access is  Level entry.  Patient will benefit from skilled PT intervention to maximize safe functional mobility, minimize fall risk and decrease caregiver burden for planned discharge home with 24 hour supervision.  Anticipate patient will  benefit from follow up [place] at discharge.  PT - End of Session Activity Tolerance: Tolerates 30+ min activity with multiple rests Endurance Deficit: Yes Endurance Deficit Description: generalized deconditioning PT Assessment Rehab Potential (ACUTE/IP ONLY): Good PT Barriers to Discharge: Hemodialysis PT Patient demonstrates impairments in the following area(s): Balance;Edema;Endurance;Motor;Nutrition;Skin Integrity;Sensory PT Transfers Functional Problem(s): Bed Mobility;Bed to Chair;Car;Furniture PT Locomotion Functional Problem(s): Ambulation;Wheelchair Mobility;Stairs PT Plan PT Intensity: Minimum of 1-2 x/day ,45 to 90 minutes PT Frequency: 5 out of 7 days PT Duration Estimated Length of Stay: 2-2.5 weeks PT Treatment/Interventions: Ambulation/gait training;Community reintegration;DME/adaptive equipment instruction;Neuromuscular re-education;Stair training;UE/LE Strength taining/ROM;Balance/vestibular training;Discharge planning;Pain management;Therapeutic Activities;UE/LE Coordination activities;Disease management/prevention;Functional mobility training;Patient/family education;Therapeutic Exercise;Skin care/wound management PT Transfers Anticipated Outcome(s): supervision PT Locomotion Anticipated Outcome(s): supervision PT Recommendation Follow Up Recommendations: Home health PT Patient destination: Home Equipment Recommended: To be determined  Skilled Therapeutic Intervention Evaluation completed (see details above and below) with education on PT POC and goals and individual treatment initiated with focus on functional mobility, safety, transfers, and positioning. Pt seated in recliner upon PT arrival, agreeable to therapy tx and denies pain. Pt reports feeling weak and tired. Pt performed sit<>stand from recliner with mod assist and RW, stand pivot to w/c with mod assist. Pt transported to the gym. Therapist provided pt with different w/c for better fit/width and also a different RW  for better fit and safety. Pt ambulated x 6 ft this session with mod assist and +2 for w/c follow for safety secondary to increased L LE weakness. Pt performed x 2 more sit<>stands this session with mod-max assist and RW, swapped out w/c. Pt reports feeling upset/discourages "I do not understand why I am so weak, I used to do anything I wanted and could walk no problem." Therapist provided education and emotional support. Pt transported back to the room and performed stand pivot to the bed with mod assist and RW, cues for techniques and hand placement. Sit>supine with supervision. Pt left supine in bed with needs in reach and bed alarm set, education on LE elevation for edema control.   PT Evaluation Precautions/Restrictions Precautions Precautions: Fall Restrictions Weight Bearing Restrictions: No Pain Pain Assessment Pain Scale: 0-10 Pain Score: 0-No pain Home Living/Prior Functioning Home Living Available Help at Discharge: Friend(s);Family Type of Home: House Home Access: Level entry Home Layout: One level Bathroom Shower/Tub: Chiropodist: Standard Bathroom Accessibility: Yes  Lives With: Significant other Prior Function Level of Independence: Independent with basic ADLs;Requires assistive device for independence Driving: Yes Vocation: Retired Comments: Hydrographic surveyor, drives Vision/Perception  Geologist, engineering: Within Advertising copywriter Praxis Praxis: Intact  Cognition Overall Cognitive Status: Within Functional Limits for tasks assessed Arousal/Alertness: Awake/alert Orientation Level: Oriented X4 Awareness: Appears intact Problem Solving: Appears intact Safety/Judgment: Appears intact Sensation Sensation Light Touch: Impaired Detail Central sensation comments: Reports numbness in his hands, B LEs WFL Proprioception: Appears Intact Coordination Gross Motor Movements are Fluid and Coordinated: No Fine Motor Movements are Fluid and  Coordinated: No Coordination and Movement Description: generalized weakness Motor  Motor Motor: Other (comment) Motor - Skilled Clinical Observations: generalized weakness  Mobility Bed Mobility Bed Mobility: Supine to Sit Supine to Sit: Contact Guard/Touching assist Transfers Transfers: Sit to Stand;Stand to Sit;Stand Pivot Transfers Sit to Stand:  Moderate Assistance - Patient 50-74%;Maximal Assistance - Patient 25-49% Stand to Sit: Minimal Assistance - Patient > 75% Stand Pivot Transfers: Moderate Assistance - Patient 50 - 74% Stand Pivot Transfer Details: Verbal cues for precautions/safety;Verbal cues for technique Transfer (Assistive device): Rolling walker Locomotion  Gait Ambulation: Yes Gait Assistance: Moderate Assistance - Patient 50-74%;2 Helpers Gait Distance (Feet): 6 Feet(+2 for w/c follow for safety) Assistive device: Rolling walker Gait Assistance Details: Verbal cues for safe use of DME/AE;Verbal cues for technique;Manual facilitation for weight shifting;Verbal cues for gait pattern Gait Gait: Yes Gait Pattern: Impaired Gait Pattern: Step-to pattern;Decreased stance time - left;Trunk flexed;Wide base of support Gait velocity: decreased Stairs / Additional Locomotion Stairs: No  Trunk/Postural Assessment  Cervical Assessment Cervical Assessment: Within Functional Limits Thoracic Assessment Thoracic Assessment: Exceptions to WFL(rounded shoulder) Lumbar Assessment Lumbar Assessment: Exceptions to WFL(posterior pelvic tilt) Postural Control Postural Control: Deficits on evaluation Righting Reactions: delayed  Balance Balance Balance Assessed: Yes Static Sitting Balance Static Sitting - Balance Support: Feet supported Static Sitting - Level of Assistance: 5: Stand by assistance Dynamic Sitting Balance Dynamic Sitting - Balance Support: Feet supported Dynamic Sitting - Level of Assistance: 5: Stand by assistance Static Standing Balance Static Standing -  Balance Support: Bilateral upper extremity supported Static Standing - Level of Assistance: 4: Min assist Dynamic Standing Balance Dynamic Standing - Balance Support: Bilateral upper extremity supported Dynamic Standing - Level of Assistance: 3: Mod assist Extremity Assessment  RUE Assessment RUE Assessment: Exceptions to [facility] General Strength Comments: Generalized weakness. 4-/5 in the UE generally. Weak gross grasp. R hand swollen, reports fall PTA, painful wrist. LUE Assessment LUE Assessment: Exceptions to [facility] General Strength Comments: Generalized weakness. 4-/5 in the UE generally. Weak gross grasp RLE Assessment Passive Range of Motion (PROM) Comments: limited ankle ROM secondary to edema General Strength Comments: generalized weakness grossly 4/5 at hip/knee, 3-/5 at ankle LLE Assessment Passive Range of Motion (PROM) Comments: limited ankle PROM secondary to edema General Strength Comments: generalized weakness (L LE weaker than R LE), 3-/5 throughout R LE    Refer to Care Plan for Long Term Goals  Recommendations for other services: None   Discharge Criteria: Patient will be discharged from PT if patient refuses treatment 3 consecutive times without medical reason, if treatment goals not met, if there is a change in medical status, if patient makes no progress towards goals or if patient is discharged from hospital.  The above assessment, treatment plan, treatment alternatives and goals were discussed and mutually agreed upon: by patient  Netta Corrigan, PT, DPT 07/17/2019, 11:55 AM

## 2019-07-18 ENCOUNTER — Inpatient Hospital Stay (HOSPITAL_COMMUNITY): Payer: Medicare Other | Admitting: Occupational Therapy

## 2019-07-18 ENCOUNTER — Inpatient Hospital Stay (HOSPITAL_COMMUNITY): Payer: Medicare Other

## 2019-07-18 LAB — GLUCOSE, CAPILLARY
Glucose-Capillary: 152 mg/dL — ABNORMAL HIGH (ref 70–99)
Glucose-Capillary: 117 mg/dL — ABNORMAL HIGH (ref 70–99)
Glucose-Capillary: 192 mg/dL — ABNORMAL HIGH (ref 70–99)
Glucose-Capillary: 154 mg/dL — ABNORMAL HIGH (ref 70–99)

## 2019-07-18 MED ORDER — HEPARIN SODIUM (PORCINE) 1000 UNIT/ML IJ SOLN
INTRAMUSCULAR | Status: AC
  Administered 2019-07-18: 3800 [IU] via INTRAVENOUS_CENTRAL
  Filled 2019-07-18: qty 4

## 2019-07-18 NOTE — Progress Notes (Signed)
Physical Therapy Session Note  Patient Details  Name: Kelly Key MRN: [id number] Date of Birth: Jan 11, 1971  Today's Date: 07/18/2019 PT Individual Time: 1100-1130 PT Individual Time Calculation (min): 30 min  and Today's Date: 07/18/2019 PT Missed Time: 30 Minutes Missed Time Reason: Patient fatigue  Short Term Goals: Week 1:  PT Short Term Goal 1 (Week 1): Pt will perform sit<>stand with min assist and LRAD PT Short Term Goal 2 (Week 1): Pt will ambulate  25 ft with min assist and LRAD PT Short Term Goal 3 (Week 1): Pt will perform bed<>chair transfer with min assist and LRAD  Skilled Therapeutic Interventions/Progress Updates:    Pt supine in bed upon PT arrival, pt reports feeling "swimmy headed." pt agreeable to try performing supine exercises in bed. Prior to starting exercises therapist checked BP in R forearm with pt in supine- BP 138/79 with HR 72. Pt report he did not go to dialysis until 11pm last night. Pt performed the following exercises for LE strengthening with cues for techniques, 2 x 10 each bilaterally: heel slides, hip abduction/adduction, active assisted SLR, ankle pumps/ankle DF AROM, and SAQ. Pt upset about not being able to fully participate today, emotional support provided. Pt remains fatigued (intermittently falling asleep) and nauseous throughout session, pt missed 30 minutes of skilled therapy tx secondary to this.   Therapy Documentation Precautions:  Precautions Precautions: Fall Restrictions Weight Bearing Restrictions: No    Therapy/Group: Individual Therapy  Netta Corrigan, PT, DPT 07/18/2019, 8:17 AM

## 2019-07-18 NOTE — Progress Notes (Signed)
Occupational Therapy Session Note  Patient Details  Name: Kelly Key MRN: [id number] Date of Birth: 04-21-1971  Today's Date: 07/18/2019 OT Individual Time: [id number] OT Individual Time Calculation (min): 60 min  15 minutes missed   Short Term Goals: Week 1:  OT Short Term Goal 1 (Week 1): Pt will don pants with min A OT Short Term Goal 2 (Week 1): Pt will complete sit <> stand with min A OT Short Term Goal 3 (Week 1): Pt will transfer to [facility] with min A OT Short Term Goal 4 (Week 1): Pt will demonstrate increased endurance and stand for 1 grooming activity with min A  Skilled Therapeutic Interventions/Progress Updates:    Pt greeted in bed, reported feeling a little nauseated and tired due to BP drops today. Agreeable to bedlevel treatment. First checked his BP with read of 110/60. With [facility] elevated, guided pt through B UE therapeutic exercises using orange theraband. He also completed several UB stretches with instruction to balance stretching with strengthening. Pt is having a tough time accepting HD tx. Provided emotional support and encouraged pt to join a HD support group post d/c. He was left with NT to assess vitals. Tethered theraband to bedrails to encourage exercising when he felt better.Time missed due to fatigue.    Therapy Documentation Precautions:  Precautions Precautions: Fall Restrictions Weight Bearing Restrictions: No Vital Signs: Therapy Vitals Temp: 98.6 F (37 C) Temp Source: Oral Pulse Rate: 80 Resp: 19 BP: 114/78 Patient Position (if appropriate): Lying Oxygen Therapy SpO2: 94 % O2 Device: Room Air Pain: No c/o pain during session    ADL:        Therapy/Group: Individual Therapy  [name] A Sheri Gatchel 07/18/2019, 3:45 PM

## 2019-07-18 NOTE — Progress Notes (Signed)
Patient information reviewed and entered into eRehab System by Becky Zahniya Zellars, PPS coordinator. Information including medical coding, function ability, and quality indicators will be reviewed and updated through discharge.   

## 2019-07-18 NOTE — Progress Notes (Signed)
Social Work Assessment and Plan   Patient Details  Name: Kelly Key MRN: [id number] Date of Birth: 04-16-1971  Today's Date: 07/18/2019  Problem List:  Patient Active Problem List   Diagnosis Date Noted  . Pain   . Physical debility 07/16/2019  . ESRD on dialysis ([place])   . Anemia of chronic disease   . Uncontrolled type 2 diabetes mellitus with hyperglycemia ([place])   . Congestive heart failure ([place])   . Volume overload 07/05/2019  . Anasarca 07/03/2019  . Dyslipidemia 07/03/2019  . Chronic combined systolic and diastolic CHF (congestive heart failure) ([place]) 07/03/2019  . Bilateral leg edema   . CKD (chronic kidney disease) stage 5, GFR less than 15 ml/min (HCC) 06/22/2019  . Uncontrolled type 2 diabetes mellitus with hyperglycemia, with long-term current use of insulin ([place]) 06/22/2019  . Hypokalemia   . Thromboembolism ([place]) 02/20/2019  . AKI (acute kidney injury) ([place]) 02/20/2019  . Acute on chronic diastolic CHF (congestive heart failure) ([place]) 03/26/2018  . Acute respiratory failure with hypoxia ([place]) 03/26/2018  . Hyponatremia 06/12/2012  . Microcytic anemia 06/12/2012  . HTN (hypertension) 06/11/2012  . Elevated LFTs 06/11/2012  . Morbid obesity ([place]) 06/11/2012  . Hepatic steatosis 06/11/2012   Past Medical History:  Past Medical History:  Diagnosis Date  . Acute diastolic CHF (congestive heart failure) ([place]) 06/11/2012   EF 50-55% [facility])  . Diabetes mellitus    A1c 11.5 (06/11/2012).  . Gout   . Hepatic steatosis 06/11/2012   Elevated LFTs  . Hyperlipemia   . Malignant hypertension   . Microcytic anemia 06/12/2012  . Obesity    Past Surgical History:  Past Surgical History:  Procedure Laterality Date  . IR FLUORO GUIDE CV LINE RIGHT  07/08/2019  . IR US GUIDE VASC ACCESS RIGHT  07/08/2019  . NO PAST SURGERIES     Social History:  reports that he has never smoked. He has never used smokeless tobacco. He reports that he does not drink alcohol or  use drugs.  Family / Support Systems Marital Status: Single Patient Roles: Partner, Other (Comment)(Sibling) Spouse/Significant Other: [name] [id number] Other Supports: [name] [phone]-cell Anticipated Caregiver: Sister is a CNA works in a NH 3-11 pm and fiance works 7p-7a and was assisting prior to admission Ability/Limitations of Caregiver: Supervision may be alone for a few hours at home Caregiver Availability: Other (Comment)(Times will be alone and needs to be safe to do so-between fiance and sister's schedules) Family Dynamics: Close with sister and fiance and he has some other friends who are supportive. Pt was active up till three weeks ago before he declined with his renal function. He is quite sleepy and answers with one word  Social History Preferred language: English Religion:  Cultural Background: No issues Education: HS Read: Yes Write: Yes Employment Status: Disabled Public relations account executive Issues: No issues Guardian/Conservator: None-according to MD pt is capable of making his own decisions while here   Abuse/Neglect Abuse/Neglect Assessment Can Be Completed: Yes Physical Abuse: Denies Verbal Abuse: Denies Sexual Abuse: Denies Exploitation of patient/patient's resources: Denies Self-Neglect: Denies  Emotional Status Pt's affect, behavior and adjustment status: Pt is wanting to sleep and not do anything he says he is tired form being in HD early this am. He got back to his room late and didn't sleep well. He wants to do for himself but feels he has to feel better too, before he can participate Recent Psychosocial Issues: health issues-has declined in the  last three weeks with kidneys. Has been back and forth to the hospital in the pas tfew months. Psychiatric History: No issues Substance Abuse History: No issues  Patient / Family Perceptions, Expectations & Goals Pt/Family understanding of illness & functional  limitations: Pt and finance can explain his medical issues. He feels the doctor's should be able to find a way to get the fluid off without doing HD. He does not want to have a permant access and go to HD for the res tof his life. He feels his kidneys will start working again. Premorbid pt/family roles/activities: brother, fiance, freind Anticipated changes in roles/activities/participation: resume Pt/family expectations/goals: Pt states: " I am so tired and don't feel good."  Fiance states: " He has been sick lately and laying around a lot."  [employer]: None Premorbid Home Care/DME Agencies: Other (Comment)(has a rw) Transportation available at discharge: Family and fiance Resource referrals recommended: Neuropsychology, Support group (specify)  Discharge Planning Living Arrangements: Spouse/significant other Support Systems: Spouse/significant other, Other relatives, Friends/neighbors Type of Residence: Private residence Insurance Resources: [employer] Financial Resources: SSD, Family Support Financial Screen Referred: No Living Expenses: Education officer, community Management: Patient, Significant Other Does the patient have any problems obtaining your medications?: No Home Management: [name] Patient/Family Preliminary Plans: Return home with renessa and his sister to assist him. He plans to be independent like he was prior to admission. He is aware of the OP HD that has been set up for him. He really doesn't want to do HD. Sw Barriers to Discharge: Behavior Sw Barriers to Discharge Comments: Self limiting and non-compliant Social Work Anticipated Follow Up Needs: HH/OP, Support Group  Clinical Impression Very tired gentleman who is only wanting to sleep and stay in bed. He has misses most of his therapies today. His fiance and sister are supportive but do work and pt will be alone some when at home upon discharge. Will ask neuro-psych to see to assist with his coping and  work on discharge plans.  Elease Hashimoto 07/18/2019, 12:48 PM

## 2019-07-18 NOTE — Progress Notes (Signed)
Occupational Therapy Session Note  Patient Details  Name: DRAGAN JAUREQUI MRN: [id number] Date of Birth: 1971/07/09  Today's Date: 07/18/2019 OT Individual Time: [id number] OT Individual Time Calculation (min): 62 min    Short Term Goals: Week 1:  OT Short Term Goal 1 (Week 1): Pt will don pants with min A OT Short Term Goal 2 (Week 1): Pt will complete sit <> stand with min A OT Short Term Goal 3 (Week 1): Pt will transfer to [facility] with min A OT Short Term Goal 4 (Week 1): Pt will demonstrate increased endurance and stand for 1 grooming activity with min A  Skilled Therapeutic Interventions/Progress Updates:    Pt completed bathing and dressing sit to stand at the sink.  He was able to complete transfer from supine to sit EOB with min assist.  He then completed mod assist stand pivot transfer to the wheelchair.  He completed all UB selfcare in sitting with setup.  Mod assist for sit to stand to remove dirty clothing and then to donn new pants.  He also completed brushing his teeth with setup of supplies at the sink.  At end of session he reported increased dizziness in sitting.  BP taken from the wheelchair at 80/67.  Nursing made aware and pt was transferred stand pivot without assistive device with max assist to the bed.  BP taken in supine at 124/81 with increased nausea noted and HR at 84 BPM.  Pt left with NT and nursing at end of session.    Therapy Documentation Precautions:  Precautions Precautions: Fall Restrictions Weight Bearing Restrictions: No  Vital Signs: Therapy Vitals Pulse Rate: 85 BP: 128/87 Patient Position (if appropriate): Sitting Oxygen Therapy SpO2: 100 % O2 Device: Room Air Pain: Pain Assessment Pain Scale: Faces Pain Score: 0-No pain ADL: See Care Tool Section for some details of ADL tasks  Therapy/Group: Individual Therapy  Manie Bealer OTR/L 07/18/2019, 12:08 PM

## 2019-07-18 NOTE — Plan of Care (Signed)
  Problem: Consults Goal: RH GENERAL PATIENT EDUCATION Description: See Patient Education module for education specifics. Outcome: Progressing Goal: Skin Care Protocol Initiated - if Braden Score 18 or less Description: If consults are not indicated, leave blank or document N/A Outcome: Progressing Goal: Nutrition Consult-if indicated Outcome: Progressing Goal: Diabetes Guidelines if Diabetic/Glucose > 140 Description: If diabetic or lab glucose is > 140 mg/dl - Initiate Diabetes/Hyperglycemia Guidelines & Document Interventions  Outcome: Progressing   Problem: RH BOWEL ELIMINATION Goal: RH STG MANAGE BOWEL WITH ASSISTANCE Description: STG Manage Bowel with Assistance. Outcome: Progressing Goal: RH STG MANAGE BOWEL W/MEDICATION W/ASSISTANCE Description: STG Manage Bowel with Medication with Assistance. Outcome: Progressing   Problem: RH BLADDER ELIMINATION Goal: RH STG MANAGE BLADDER WITH ASSISTANCE Description: STG Manage Bladder With Assistance Outcome: Progressing Goal: RH STG MANAGE BLADDER WITH MEDICATION WITH ASSISTANCE Description: STG Manage Bladder With Medication With Assistance. Outcome: Progressing Goal: RH STG MANAGE BLADDER WITH EQUIPMENT WITH ASSISTANCE Description: STG Manage Bladder With Equipment With Assistance Outcome: Progressing   Problem: RH SKIN INTEGRITY Goal: RH STG SKIN FREE OF INFECTION/BREAKDOWN Outcome: Progressing Goal: RH STG MAINTAIN SKIN INTEGRITY WITH ASSISTANCE Description: STG Maintain Skin Integrity With Assistance. Outcome: Progressing Goal: RH STG ABLE TO PERFORM INCISION/WOUND CARE W/ASSISTANCE Description: STG Able To Perform Incision/Wound Care With Assistance. Outcome: Progressing   Problem: RH SAFETY Goal: RH STG ADHERE TO SAFETY PRECAUTIONS W/ASSISTANCE/DEVICE Description: STG Adhere to Safety Precautions With Assistance/Device. Outcome: Progressing Goal: RH STG DECREASED RISK OF FALL WITH ASSISTANCE Description: STG  Decreased Risk of Fall With Assistance. Outcome: Progressing   Problem: RH PAIN MANAGEMENT Goal: RH STG PAIN MANAGED AT OR BELOW PT'S PAIN GOAL Outcome: Progressing   Problem: RH KNOWLEDGE DEFICIT GENERAL Goal: RH STG INCREASE KNOWLEDGE OF SELF CARE AFTER HOSPITALIZATION Outcome: Progressing   

## 2019-07-18 NOTE — Care Management Note (Signed)
Inpatient [facility] Statement of Services  Patient Name:  Kelly Key  Date:  07/18/2019  Welcome to the [facility].  Our goal is to provide you with an individualized program based on your diagnosis and situation, designed to meet your specific needs.  With this comprehensive rehabilitation program, you will be expected to participate in at least 3 hours of rehabilitation therapies Monday-Friday, with modified therapy programming on the weekends.  Your rehabilitation program will include the following services:  Physical Therapy (PT), Occupational Therapy (OT), 24 hour per day rehabilitation nursing, Therapeutic Recreaction (TR), Neuropsychology, Case Management (Social Worker), Rehabilitation Medicine, Nutrition Services and Pharmacy Services  Weekly team conferences will be held on Wednesday to discuss your progress.  Your Social Worker will talk with you frequently to get your input and to update you on team discussions.  Team conferences with you and your family in attendance may also be held.  Expected length of stay: 14-18 days  Overall anticipated outcome: supervision with cues  Depending on your progress and recovery, your program may change. Your Social Worker will coordinate services and will keep you informed of any changes. Your Social Worker's name and contact numbers are listed  below.  The following services may also be recommended but are not provided by the [facility] will be made to provide these services after discharge if needed.  Arrangements include referral to agencies that provide these services.  Your insurance has been verified to be:  Medicare Your primary doctor is:  [name]  Pertinent information will be shared with your doctor and your insurance company.  Social Worker:  Ovidio Kin, [place] or ([phone]  Information discussed with and copy given to patient by: Elease Hashimoto, 07/18/2019, 10:34 AM

## 2019-07-18 NOTE — Progress Notes (Signed)
[facility] Progress Note    Assessment/ Plan:   Kelly Key Kelly Key an 48 y.o.malewitha PMH significant for dCHF, DM, HTN, HLD, morbid obesity, fatty liver disease, and CKD stage 4 followed by Dr. Lowanda Foster with nephrotic range proteinuria (documented in past as >11 grams/24 hours) who was recently hospitalized on 06/22/2019 [facility] with a 2 week history of worsening lower extremity edema and abdominal girth not responsive to increasing doses of torsemide as an outpatient. He has had accelerated HTN and workup for renal artery stenosis was underway with [name] however that has not been scheduled.During that hospitalization there were extensive discussions with the pt educating him about the need to initiate dialysis but he refused wanting to give a trial with outpt diuretics. In the past Kelly Key hadexpressed that he would "rather die than go on dialysis" and is followed by Dr. Lowanda Foster, however on further discussion he always states that he's not ready to die. Patient went home with torsemide 60mg  BID and unfortunately did not respond even though he was compliant.   1.  End-stage renal disease. Status post [facility] 07/08/2019 and first HD.  3rd HD 9/15, now on TTS.  Discussed permanent access again with him today--> he still wants to hold off but it seems like he's more receptive to discussions.  Discussed PD and HD -> he'll think about it and possibly let me know tomorrow but he's leaning towards HD w/ AVS.  2.  Hypertension.  Should improve with ultrafiltration.  On home dose of amlodipine.  3.  Acute on chronic diastolic congestive heart failure. Improving with dialysis.  4.  Malignant hypertension.  Will monitor blood pressure with dialysis.  Doubt intervention on any significant renal artery stenosis would make a difference in his renal function.    5.  Diabetes mellitus.  Overall poor control with a hemoglobin A1c of 12.4.  6.  Volume overload.  Should improve with  ultrafiltration on dialysis.  7.  Anemia.  Is iron replete.  Hgb 10.1, will follow and dose Aranesp as appropriate- no ESA yet.    8.  BMD. Cont  phosphate binders.  9.  To CIR, CLIP  Subjective:   Swelling much better as is the dyspnea.   Objective:   BP 114/78 (BP Location: Right Arm)   Pulse 80   Temp 98.6 F (37 C) (Oral)   Resp 19   Ht 5\' 7"  (1.702 m)   Wt 131.8 kg   SpO2 94%   BMI 45.51 kg/m   Intake/Output Summary (Last 24 hours) at 07/18/2019 1807 Last data filed at 07/18/2019 [id number] Gross per 24 hour  Intake 480 ml  Output 4250 ml  Net -3770 ml   Weight change: 40.5 kg  Physical Exam: General:  AAOx3 NAD, transferring to bed from chair Neck:  No JVD CV:  Heart RRR  Lungs: clear anteriorly Abd:  abd SNT/ND with normal BS Extremities: +1 bilateral lower extremity edema. ACCESS: Kelly Key [facility]  Imaging: Dg Wrist Complete Right  Result Date: 07/17/2019 CLINICAL DATA:  Pain, swelling, history of gout, no injury EXAM: RIGHT WRIST - COMPLETE 3+ VIEW COMPARISON:  None. FINDINGS: No fracture or dislocation of the right wrist. The carpus is normally aligned. No significant arthritic change. There is no obvious bony erosion or sclerosis to suggest erosive arthropathy. Soft tissue edema about the wrist. IMPRESSION: No fracture or dislocation of the right wrist. The carpus is normally aligned. No significant arthritic change. There is no obvious bony erosion or  sclerosis to suggest erosive arthropathy. Soft tissue edema about the wrist. Electronically Signed   By: Eddie Candle M.D.   On: 07/17/2019 17:37    Labs: BMET Recent Labs  Lab 07/12/19 0510 07/13/19 0456 07/14/19 0400 07/15/19 0201 07/15/19 0555 07/17/19 0649  NA 134* 133* 131* 129* 134* 133*  K 3.7 4.3 4.4 4.4 4.0 4.5  CL 92* 95* 91* 90* 97* 93*  CO2 23 23 22 22 23 24   GLUCOSE 170* 147* 150* 196* 144* 140*  BUN 92* 62* 89* 106* 46* 87*  CREATININE 6.64* 6.10* 7.77* 8.17* 4.61* 7.16*  CALCIUM 8.9 8.7* 8.9  9.1 8.7* 9.1  PHOS 8.7* 7.6* 9.0* 9.2* 4.7* 7.7*   CBC Recent Labs  Lab 07/12/19 0759 07/15/19 0200 07/16/19 1640  WBC 8.8 9.6 8.6  NEUTROABS  --  5.0  --   HGB 10.1* 10.7* 10.1*  HCT 31.5* 33.4* 32.5*  MCV 77.4* 77.0* 78.5*  PLT 489* 500* 407*    Medications:    . allopurinol  200 mg Oral Daily  . amLODipine  10 mg Oral Daily  . aspirin EC  81 mg Oral Daily  . atorvastatin  80 mg Oral Daily  . carvedilol  25 mg Oral BID WC  . Chlorhexidine Gluconate Cloth  6 each Topical Q0600  . cloNIDine  0.3 mg Oral TID  . ezetimibe  10 mg Oral QPM  . fluticasone  1 spray Each Nare Daily  . furosemide  40 mg Oral BID  . gabapentin  100 mg Oral TID  . heparin  5,000 Units Subcutaneous Q8H  . insulin aspart  0-20 Units Subcutaneous TID WC  . insulin aspart  0-5 Units Subcutaneous QHS  . insulin detemir  35 Units Subcutaneous BID  . linaclotide  145 mcg Oral QAC breakfast  . sevelamer carbonate  800 mg Oral TID WC      Otelia Santee, MD 07/18/2019, 6:07 PM

## 2019-07-18 NOTE — Progress Notes (Signed)
[facility] PHYSICAL MEDICINE & REHABILITATION PROGRESS NOTE  Subjective/Complaints:   Pt reports no issues except LBM was "the other day"- is holding off on prns until after therapy.    ROS: .  Denies CP, shortness of breath, nausea, vomiting, diarrhea.  Objective: Vital Signs: Blood pressure 128/87, pulse 85, temperature 98.3 F (36.8 C), resp. rate 18, height 5\' 7"  (1.702 m), weight 131.8 kg, SpO2 100 %. Dg Wrist Complete Right  Result Date: 07/17/2019 CLINICAL DATA:  Pain, swelling, history of gout, no injury EXAM: RIGHT WRIST - COMPLETE 3+ VIEW COMPARISON:  None. FINDINGS: No fracture or dislocation of the right wrist. The carpus is normally aligned. No significant arthritic change. There is no obvious bony erosion or sclerosis to suggest erosive arthropathy. Soft tissue edema about the wrist. IMPRESSION: No fracture or dislocation of the right wrist. The carpus is normally aligned. No significant arthritic change. There is no obvious bony erosion or sclerosis to suggest erosive arthropathy. Soft tissue edema about the wrist. Electronically Signed   By: Eddie Candle M.D.   On: 07/17/2019 17:37   Recent Labs    07/16/19 1640  WBC 8.6  HGB 10.1*  HCT 32.5*  PLT 407*   Recent Labs    07/17/19 0649  NA 133*  K 4.5  CL 93*  CO2 24  GLUCOSE 140*  BUN 87*  CREATININE 7.16*  CALCIUM 9.1    Physical Exam: BP 128/87 (BP Location: Left Arm)   Pulse 85   Temp 98.3 F (36.8 C)   Resp 18   Ht 5\' 7"  (1.702 m)   Wt 131.8 kg   SpO2 100%   BMI 45.51 kg/m  Constitutional: No distress . Vital signs and labs reviewed.  Morbidly obese. Sitting up in manual w/c in room; OT at bedside, NAD HENT: Normocephalic.  Atraumatic. Eyes: EOMI. No discharge. Cardiovascular: No JVD. Respiratory: Normal effort.  No stridor. GI: Non-distended. Skin: Warm and dry.  Intact. Psych: Normal mood.  Normal behavior. Musc: no complaints of R wrist pain today Neurological: Alert Bilateral upper  extremities: 4/5 proximal distal Bilateral lower extremities: 4-/5 proximal distal  Skin: Vascular changes bilateral lower extremities  Psychiatric: His speech is normal. His affect is blunt.   Assessment/Plan: 1. Functional deficits secondary to debility which require 3+ hours per day of interdisciplinary therapy in a comprehensive inpatient rehab setting.  Physiatrist is providing close team supervision and 24 hour management of active medical problems listed below.  Physiatrist and rehab team continue to assess barriers to discharge/monitor patient progress toward functional and medical goals  Care Tool:  Bathing  Bathing activity did not occur: Safety/medical concerns Body parts bathed by patient: Right lower leg, Left lower leg, Front perineal area, Buttocks         Bathing assist Assist Level: Minimal Assistance - Patient > 75%     Upper Body Dressing/Undressing Upper body dressing   What is the patient wearing?: Pull over shirt    Upper body assist Assist Level: Minimal Assistance - Patient > 75%    Lower Body Dressing/Undressing Lower body dressing    Lower body dressing activity did not occur: N/A What is the patient wearing?: Pants     Lower body assist Assist for lower body dressing: Moderate Assistance - Patient 50 - 74%     Toileting Toileting Toileting Activity did not occur (Clothing management and hygiene only): N/A (no void or bm)  Toileting assist Assist for toileting: Moderate Assistance - Patient 50 - 74%  Transfers Chair/bed transfer  Transfers assist     Chair/bed transfer assist level: Moderate Assistance - Patient 50 - 74%     Locomotion Ambulation   Ambulation assist      Assist level: 2 helpers(mod +2 for w/c follow L LE weakness) Assistive device: Walker-rolling Max distance: 6 ft   Walk 10 feet activity   Assist  Walk 10 feet activity did not occur: Safety/medical concerns        Walk 50 feet  activity   Assist Walk 50 feet with 2 turns activity did not occur: Safety/medical concerns         Walk 150 feet activity   Assist Walk 150 feet activity did not occur: Safety/medical concerns         Walk 10 feet on uneven surface  activity   Assist Walk 10 feet on uneven surfaces activity did not occur: Safety/medical concerns         Wheelchair     Assist Will patient use wheelchair at discharge?: No             Wheelchair 50 feet with 2 turns activity    Assist            Wheelchair 150 feet activity     Assist            Medical Problem List and Plan: 1.  Generalized weakness with limitations in self-care secondary to debility  Begin CIR evaluations 2.  Antithrombotics: -DVT/anticoagulation:  Pharmaceutical: Heparin             -antiplatelet therapy: On ASA 81 mg daily 3. Pain Management: On gabapentin 3 times daily for neuropathy with hydrocodone on PRN basis             Monitor with increased mobility 4. Mood: LCSW to follow for evaluation and support             -antipsychotic agents: N/A 5. Neuropsych: This patient is capable of making decisions on his own behalf. 6. Skin/Wound Care: Routine pressure relief measures 7. Fluids/Electrolytes/Nutrition: CM/renal diet with 1200 cc FR.   - pt's electrolytes and BMP is able;  8.  T2DM with hyperglycemia: Hemoglobin A1c-12.4 and poorly controlled.  Continue Levemir twice daily and use SSI for tighter blood sugar control.  Monitor blood sugars AC/HS.              Monitor with increased mobility. 9.  Malignant hypertension: Monitor BP TID--follow-up with [name] for work-up to rule out RAS. Continue amlodipine, Coreg twice daily, clonidine TID,  IV furosemide BID             Monitor with increased mobility. 10.  Anemia of chronic disease: Received IV iron             Labs with HD  Hemoglobin 10.1 on 9/19 11.  ESRD/chronic combined CHF: Anasarca multifactorial and improving with  hemodialysis.             Daily weights Filed Weights   07/17/19 0436 07/17/19 2155 07/18/19 0203  Weight: 135.1 kg 135.5 kg 131.8 kg   Unreliable recordings 12.  Right wrist pain  X-ray ordered  Patient also states she has a history of gout, will order uric acid   Will consider empiric treatment for gout  - pt denies wrist pain today; uric acid is 7.1 LOS: 2 days A FACE TO FACE EVALUATION WAS PERFORMED  Lumir Demetriou 07/18/2019, 11:24 AM

## 2019-07-18 NOTE — Progress Notes (Signed)
Jamse Arn, MD  Physician  Physical Medicine and Rehabilitation  PMR Pre-admission  Addendum  Date of Service:  07/16/2019 8:47 AM      Related encounter: ED to Hosp-Admission (Discharged) from 07/03/2019 in [place]         PMR Admission Coordinator Pre-Admission Assessment  Patient: Kelly Key is an 48 y.o., male MRN: [id number] DOB: 1971/08/26 Height: 5' 7"  (170.2 cm) Weight: 132 kg  Insurance Information HMO:     PPO:      PCP:      IPA:      80/20: yes     OTHER:  PRIMARY: medicare part A and B      Policy#: [id number]      Subscriber: patient CM Name:       Phone#:      Fax#:  Pre-Cert#:       Employer:  Benefits:  Phone #: online     Name: verified eligibility online via [place] on 07/14/2019 Eff. Date: part A and B effective 05/27/2012     Deduct: $1,408      Out of Pocket Max: NA      Life Max: NA CIR: 100%      SNF: days 1-20, 100%, days 21-100, 80% Outpatient: 80%     Co-Pay: 20% Home Health: 100%      Co-Pay: 0% DME: 80%     Co-Pay: 20% Providers: Pt's choice  SECONDARY: None      Policy#:       Subscriber:  CM Name:       Phone#:      Fax#:  Pre-Cert#:       Employer:  Benefits:  Phone #:      Name: Eff. Date:      Deduct:       Out of Pocket Max:       Life Max:  CIR:      SNF:  Outpatient:      Co-Pay:  Home Health:       Co-Pay: DME:      Co-Pay:   Medicaid Application Date:       Case Manager:  Disability Application Date:       Case Worker:   The "Data Collection Information Summary" for patients in Inpatient Rehabilitation Facilities with attached "Privacy Act [place] Records" was provided and verbally reviewed with: Patient and Family  Emergency Contact Information         Contact Information    Name Relation Home Work Mobile   [place] Sister   [phone]   Edrick Kins   [phone]      Current Medical History  Patient Admitting Diagnosis: Debility due to multiple  medical issues  History of Present Illness: Kelly Key is a 48 year old male with history of T2DM--poorly controlled, malignant hypertension, morbid obesity, CKD stage IV who was recently admitted to [facility] 8/26- 8/31/202-week history of worsening of peripheral edema and abdominal girth. He was started aggressive IV diuresis for acute on chronic renal failure with anasarca and Nephrology recommended dialysis however patient adamantly declined this and preferred to go home with increase in diuretics. He was readmitted on 07/03/2019 with worsening of lower extremity edema with inability to ambulate,inability to lie flat and foot pain. After extensive conversations,he was agreeable to start hemodialysis,tunneled HD catheter placed by [name] on 9/11 and hemodialysis initiated. Blood pressures continue to be labile and MRA renal arteries ordered to rule out stenosis but nephrology doubts any  intervention would make a difference in renal status. To follow-up with [name] after discharge he continues to refuse placement of permanent access. Acute on chronic diastolic failure improving with dialysis with decrease in anasarca. He continues to have issues with debility affecting ADLs and mobility. CIR recommended due to recent functional decline.    Patient's medical record from [facility] has been reviewed by the rehabilitation admission coordinator and physician.  Past Medical History      Past Medical History:  Diagnosis Date  . Acute diastolic CHF (congestive heart failure) ([place]) 06/11/2012   EF 50-55% [facility])  . Diabetes mellitus    A1c 11.5 (06/11/2012).  . Gout   . Hepatic steatosis 06/11/2012   Elevated LFTs  . Hyperlipemia   . Malignant hypertension   . Microcytic anemia 06/12/2012  . Obesity     Family History   family history includes Asthma in his mother; Diabetes in his father, sister, and sister; Gout in his mother; Heart failure in  his father; Hypertension in his brother; Pancreatic cancer in his brother.  Prior Rehab/Hospitalizations Has the patient had prior rehab or hospitalizations prior to admission? Yes  Has the patient had major surgery during 100 days prior to admission? Yes             Current Medications  Current Facility-Administered Medications:  .  0.9 %  sodium chloride infusion, 250 mL, Intravenous, PRN, Karmen Bongo, MD .  acetaminophen (TYLENOL) tablet 650 mg, 650 mg, Oral, Q4H PRN, Karmen Bongo, MD, 650 mg at 07/08/19 1439 .  albuterol (PROVENTIL) (2.5 MG/3ML) 0.083% nebulizer solution 3 mL, 3 mL, Inhalation, Q4H PRN, Karmen Bongo, MD .  allopurinol (ZYLOPRIM) tablet 200 mg, 200 mg, Oral, Daily, Emokpae, Courage, MD, 200 mg at 07/15/19 1000 .  amLODipine (NORVASC) tablet 10 mg, 10 mg, Oral, Daily, Finnigan, Nancy A, DO, 10 mg at 07/15/19 1001 .  aspirin EC tablet 81 mg, 81 mg, Oral, Daily, Karmen Bongo, MD, 81 mg at 07/15/19 1000 .  atorvastatin (LIPITOR) tablet 80 mg, 80 mg, Oral, Daily, Karmen Bongo, MD, 80 mg at 07/15/19 1000 .  calcium carbonate (dosed in mg elemental calcium) suspension 500 mg of elemental calcium, 500 mg of elemental calcium, Oral, Q6H PRN, Karmen Bongo, MD .  camphor-menthol [facility]) lotion 1 application, 1 application, Topical, H0T PRN **AND** hydrOXYzine (ATARAX/VISTARIL) tablet 25 mg, 25 mg, Oral, Q8H PRN, Karmen Bongo, MD .  carvedilol (COREG) tablet 25 mg, 25 mg, Oral, BID WC, Karmen Bongo, MD, 25 mg at 07/15/19 1000 .  Chlorhexidine Gluconate Cloth 2 % PADS 6 each, 6 each, Topical, Q0600, Claudia Desanctis, MD, 6 each at 07/15/19 [phone] .  cloNIDine (CATAPRES) tablet 0.3 mg, 0.3 mg, Oral, TID, Karmen Bongo, MD, 0.3 mg at 07/15/19 1001 .  docusate sodium (ENEMEEZ) enema 283 mg, 1 enema, Rectal, PRN, Karmen Bongo, MD .  ezetimibe (ZETIA) tablet 10 mg, 10 mg, Oral, QPM, Karmen Bongo, MD, 10 mg at 07/13/19 1746 .  feeding supplement (NEPRO CARB  STEADY) liquid 237 mL, 237 mL, Oral, TID PRN, Karmen Bongo, MD .  fluticasone (FLONASE) 50 MCG/ACT nasal spray 1 spray, 1 spray, Each Nare, Daily, Karmen Bongo, MD, 1 spray at 07/13/19 1306 .  furosemide (LASIX) tablet 40 mg, 40 mg, Oral, BID, Amin, Ankit Chirag, MD .  gabapentin (NEURONTIN) capsule 100 mg, 100 mg, Oral, TID, Karmen Bongo, MD, 100 mg at 07/15/19 1000 .  heparin injection 5,000 Units, 5,000 Units, Subcutaneous, Q8H, Benjie Karvonen,  Calvert Cantor, PA-C, 5,000 Units at 07/15/19 1414 .  HYDROcodone-acetaminophen (NORCO/VICODIN) 5-325 MG per tablet 1-2 tablet, 1-2 tablet, Oral, Q6H PRN, Bodenheimer, Charles A, NP, 1 tablet at 07/15/19 [phone] .  insulin aspart (novoLOG) injection 0-20 Units, 0-20 Units, Subcutaneous, TID WC, Karmen Bongo, MD, 4 Units at 07/15/19 1241 .  insulin aspart (novoLOG) injection 0-5 Units, 0-5 Units, Subcutaneous, QHS, Karmen Bongo, MD, 2 Units at 07/15/19 0038 .  insulin detemir (LEVEMIR) injection 35 Units, 35 Units, Subcutaneous, BID, Emokpae, Courage, MD, 35 Units at 07/15/19 1241 .  linaclotide (LINZESS) capsule 145 mcg, 145 mcg, Oral, QAC breakfast, Karmen Bongo, MD, 145 mcg at 07/15/19 1000 .  ondansetron (ZOFRAN) injection 4 mg, 4 mg, Intravenous, Q6H PRN, Karmen Bongo, MD .  potassium chloride SA (K-DUR) CR tablet 20 mEq, 20 mEq, Oral, Once, Claudia Desanctis, MD, Stopped at 07/06/19 [phone] .  sevelamer carbonate (RENVELA) tablet 800 mg, 800 mg, Oral, TID WC, Finnigan, Nancy A, DO, 800 mg at 07/15/19 1000 .  sodium chloride flush (NS) 0.9 % injection 3 mL, 3 mL, Intravenous, Q12H, Karmen Bongo, MD, 3 mL at 07/15/19 0941 .  sodium chloride flush (NS) 0.9 % injection 3 mL, 3 mL, Intravenous, PRN, Karmen Bongo, MD .  sorbitol 70 % solution 30 mL, 30 mL, Oral, PRN, Karmen Bongo, MD .  traMADol Veatrice Bourbon) tablet 50 mg, 50 mg, Oral, Once, Blount, Lolita Cram, NP  Patients Current Diet:     Diet Order                  Diet renal/carb modified  with fluid restriction Diet-HS Snack? Nothing; Fluid restriction: 1200 mL Fluid; Room service appropriate? Yes; Fluid consistency: Thin  Diet effective now               Precautions / Restrictions Precautions Precautions: Fall Restrictions Weight Bearing Restrictions: No   Has the patient had 2 or more falls or a fall with injury in the past year? No  Prior Activity Level Community (5-7x/wk): until 3 weeks ago, pt was active and Independent; drove, retired heavy Radio producer  Prior Functional Level Self Care: Did the patient need help bathing, dressing, using the toilet or eating? Independent  Indoor Mobility: Did the patient need assistance with walking from room to room (with or without device)? Independent  Stairs: Did the patient need assistance with internal or external stairs (with or without device)? Needed some help  Functional Cognition: Did the patient need help planning regular tasks such as shopping or remembering to take medications? Independent  Home Assistive Devices / Equipment Home Assistive Devices/Equipment: None Home Equipment: Walker - 2 wheels  Prior Device Use: Indicate devices/aids used by the patient prior to current illness, exacerbation or injury? Walker only use for past 3 weeks when functional decline began  Current Functional Level Cognition  Overall Cognitive Status: Within Functional Limits for tasks assessed Orientation Level: Oriented X4 General Comments: very motivated    Extremity Assessment (includes Sensation/Coordination)  Upper Extremity Assessment: Generalized weakness  Lower Extremity Assessment: Defer to PT evaluation    ADLs  Overall ADL's : Needs assistance/impaired Eating/Feeding: Independent, Sitting Grooming: Wash/dry hands, Wash/dry face, Sitting, Min guard Upper Body Bathing: Min guard, Sitting Lower Body Bathing: Maximal assistance, Sitting/lateral leans Upper Body Dressing : Min guard, Sitting  Lower Body Dressing: Total assistance Toilet Transfer: Moderate assistance, Minimal assistance, +2 for safety/equipment, Cueing for safety, Cueing for sequencing, Ambulation, RW Toileting- Clothing Manipulation and Hygiene: Total assistance Functional mobility during  ADLs: Moderate assistance, Minimal assistance, +2 for physical assistance, Cueing for safety, Cueing for sequencing, Rolling walker    Mobility  Overal bed mobility: Needs Assistance Bed Mobility: Supine to Sit Supine to sit: Supervision, HOB elevated General bed mobility comments: increased time and effort, +rail    Transfers  Overall transfer level: Needs assistance Equipment used: Rolling walker (2 wheeled) Transfers: Sit to/from Stand Sit to Stand: Min assist Stand pivot transfers: Min assist, +2 physical assistance General transfer comment: increased time and effort to power up and stabilize standing balance    Ambulation / Gait / Stairs / Wheelchair Mobility  Ambulation/Gait Ambulation/Gait assistance: Herbalist (Feet): 10 Feet(x 2) Assistive device: Rolling walker (2 wheeled) Gait Pattern/deviations: Step-through pattern, Trunk flexed General Gait Details: heavy reliance on RW; slow, guarded gait; 10' x 2 trials Gait velocity: decreased Gait velocity interpretation: <1.8 ft/sec, indicate of risk for recurrent falls    Posture / Balance Dynamic Sitting Balance Sitting balance - Comments: seated at bedside Balance Overall balance assessment: Needs assistance Sitting-balance support: Feet supported, No upper extremity supported Sitting balance-Leahy Scale: Fair Sitting balance - Comments: seated at bedside Standing balance support: During functional activity, Bilateral upper extremity supported Standing balance-Leahy Scale: Poor Standing balance comment: reliant on UE support    Special needs/care consideration BiPAP/CPAP : no CPM : no Continuous Drip IV  Dialysis : yes        Days :  T/TH/SAT  Life Vest : no Oxygen no Special Bed : no Trach Size : no Wound Vac (area) : no      Location : no Skin ecchymosis to abdomen                              Bowel mgmt: last BM 9/13, continent Bladder mgmt: continent Diabetic mgmt: Yes Behavioral consideration : no Chemo/radiation : no   Previous Home Environment (from acute therapy documentation) Living Arrangements: Alone Available Help at Discharge: Friend(s) Type of Home: House Home Layout: One level Home Access: Level entry Bathroom Shower/Tub: Chiropodist: Standard Home Care Services: No  Discharge Living Setting Plans for Discharge Living Setting: Patient's home, Lives with (comment)(lives with fiance [name]) Type of Home at Discharge: House Discharge Home Layout: One level Discharge Home Access: Level entry Discharge Bathroom Shower/Tub: Tub/shower unit Discharge Bathroom Toilet: Standard Discharge Bathroom Accessibility: Yes How Accessible: Accessible via walker Does the patient have any problems obtaining your medications?: No  Social/Family/Support Systems Patient Roles: Other (Comment)(has fiance, has sister nearby who is supportive) Contact Information: sister: Hilda Blades: [phone]; fiance Jolayne Haines): [phone]; [phone] Anticipated Caregiver: sister (CNA at nursing home): works 3pm-11pm; but can assist with ADLs other times; Fiance works 3rd shift (7pm-7am) and can assist when home Anticipated Caregiver's Contact Information: see above Ability/Limitations of Caregiver: Min A Caregiver Availability: Intermittent-however, fiance (whom he lives with) states she will assist him after her shifts and has been for awhile now. Discharge Plan Discussed with Primary Caregiver: Yes(confirmed with fiance) Is Caregiver In Agreement with Plan?: Yes Does Caregiver/Family have Issues with Lodging/Transportation while Pt is in Rehab?: No  Goals/Additional Needs Patient/Family Goal for  Rehab: PT/OT: Mod I/Supervision; SLP: NA Expected length of stay: 6-8 days Cultural Considerations: NA Dietary Needs: renal/carb modified with fluid restriction; fluid restriction: 1200 mL; thin lqiuids Equipment Needs: TBD Special Service Needs: new to HD (T/TH/SAT) call [place] (HD Coordinator) prior to [place]. [phone] Pt/Family Agrees to Admission and willing to  participate: Yes Program Orientation Provided & Reviewed with Pt/Caregiver Including Roles  & Responsibilities: Yes(pt and fiance)  Barriers to Discharge: Lack of/limited family support, Hemodialysis  Decrease burden of Care through IP rehab admission: NA  Possible need for SNF placement upon discharge: Not anticipated. Pt has good prognosis for further progress through CIR. Pt can have intermittent assist at home and has wc accessible home for entry.   Patient Condition: I have reviewed medical records from [facility], spoken with PT, MD, and patient. I met with patient at the bedside for inpatient rehabilitation assessment.  Patient will benefit from ongoing PT and OT, can actively participate in 3 hours of therapy a day 5 days of the week, and can make measurable gains during the admission.  Patient will also benefit from the coordinated team approach during an Inpatient Acute Rehabilitation admission.  The patient will receive intensive therapy as well as Rehabilitation physician, nursing, social worker, and care management interventions.  Due to safety, skin/wound care, disease management, medication administration, pain management and patient education the patient requires 24 hour a day rehabilitation nursing.  The patient is currently min assist with mobility and basic ADLs.  Discharge setting and therapy post discharge at home with home health is anticipated.  Patient has agreed to participate in the Acute Inpatient Rehabilitation Program and will admit today.  Preadmission Screen Completed By:  Jhonnie Garner,  07/13/2019 5:47 PM, with brief updates by Shann Medal, PT, DPT on 07/15/19 at 2:36 PM  ______________________________________________________________________   Discussed status with Dr. Posey Pronto on 07/15/19  at 2:36 PM  and received approval for tentative admission tomorrow.  Admission Coordinator:  Michel Santee, PT, time 2:36 PM Sudie Grumbling 07/15/19    Assessment/Plan: Diagnosis: Debility  1. Does the need for close, 24 hr/day Medical supervision in concert with the patient's rehab needs make it unreasonable for this patient to be served in a less intensive setting? Yes 2. Co-Morbidities requiring supervision/potential complications: [id number] controlled, malignant hypertension, morbid obesity, CKD stage IV, now on HD 3. Due to bowel management, safety, skin/wound care, disease management and patient education, does the patient require 24 hr/day rehab nursing? Yes 4. Does the patient require coordinated care of a physician, rehab nurse, PT (1-2 hrs/day, 5 days/week) and OT (1-2 hrs/day, 5 days/week) to address physical and functional deficits in the context of the above medical diagnosis(es)? Yes Addressing deficits in the following areas: balance, endurance, locomotion, strength, transferring, bathing, dressing, toileting and psychosocial support 5. Can the patient actively participate in an intensive therapy program of at least 3 hrs of therapy 5 days a week? Yes 6. The potential for patient to make measurable gains while on inpatient rehab is good 7. Anticipated functional outcomes upon discharge from inpatients are: modified independent and supervision PT, modified independent and supervision OT, n/a SLP 8. Estimated rehab length of stay to reach the above functional goals is: 7-10 days. 9. Anticipated D/C setting: Home 10. Anticipated post D/C treatments: HH therapy and Home excercise program 11. Overall Rehab/Functional Prognosis: good  MD Signature: Delice Lesch, MD, ABPMR     Revision History Date/Time User Provider Type Action  07/16/2019 10:44 AM Jamse Arn, MD Physician Addend  07/16/2019 10:43 AM Jamse Arn, MD Physician Sign  07/15/2019 2:36 PM Michel Santee, PT Rehab Admission Coordinator Share  07/14/2019 3:23 PM Jhonnie Garner, [place] Rehab Admission Coordinator Share  07/14/2019 2:14 PM Jhonnie Garner, [place] Rehab Admission Coordinator Share  07/14/2019 1:53 PM Jhonnie Garner,  OT Rehab Admission Coordinator Share  View Details Report

## 2019-07-19 ENCOUNTER — Inpatient Hospital Stay (HOSPITAL_COMMUNITY): Payer: Medicare Other | Admitting: Occupational Therapy

## 2019-07-19 ENCOUNTER — Inpatient Hospital Stay (HOSPITAL_COMMUNITY): Payer: Medicare Other | Admitting: Rehabilitation

## 2019-07-19 ENCOUNTER — Ambulatory Visit: Payer: Medicare Other | Admitting: Cardiology

## 2019-07-19 DIAGNOSIS — R7309 Other abnormal glucose: Secondary | ICD-10-CM

## 2019-07-19 DIAGNOSIS — S66911D Strain of unspecified muscle, fascia and tendon at wrist and hand level, right hand, subsequent encounter: Secondary | ICD-10-CM

## 2019-07-19 DIAGNOSIS — S66911A Strain of unspecified muscle, fascia and tendon at wrist and hand level, right hand, initial encounter: Secondary | ICD-10-CM

## 2019-07-19 DIAGNOSIS — I150 Renovascular hypertension: Secondary | ICD-10-CM

## 2019-07-19 LAB — CBC
HCT: 31 % — ABNORMAL LOW (ref 39.0–52.0)
Hemoglobin: 9.9 g/dL — ABNORMAL LOW (ref 13.0–17.0)
MCH: 24.9 pg — ABNORMAL LOW (ref 26.0–34.0)
MCHC: 31.9 g/dL (ref 30.0–36.0)
MCV: 78.1 fL — ABNORMAL LOW (ref 80.0–100.0)
Platelets: 373 10*3/uL (ref 150–400)
RBC: 3.97 MIL/uL — ABNORMAL LOW (ref 4.22–5.81)
RDW: 15.3 % (ref 11.5–15.5)
WBC: 11.6 10*3/uL — ABNORMAL HIGH (ref 4.0–10.5)
nRBC: 0 % (ref 0.0–0.2)

## 2019-07-19 LAB — RENAL FUNCTION PANEL
Albumin: 2.6 g/dL — ABNORMAL LOW (ref 3.5–5.0)
Anion gap: 16 — ABNORMAL HIGH (ref 5–15)
BUN: 69 mg/dL — ABNORMAL HIGH (ref 6–20)
CO2: 25 mmol/L (ref 22–32)
Calcium: 8.9 mg/dL (ref 8.9–10.3)
Chloride: 90 mmol/L — ABNORMAL LOW (ref 98–111)
Creatinine, Ser: 6.86 mg/dL — ABNORMAL HIGH (ref 0.61–1.24)
GFR calc Af Amer: 10 mL/min — ABNORMAL LOW (ref >60)
GFR calc non Af Amer: 9 mL/min — ABNORMAL LOW (ref >60)
Glucose, Bld: 165 mg/dL — ABNORMAL HIGH (ref 70–99)
Phosphorus: 5.9 mg/dL — ABNORMAL HIGH (ref 2.5–4.6)
Potassium: 4.2 mmol/L (ref 3.5–5.1)
Sodium: 131 mmol/L — ABNORMAL LOW (ref 135–145)

## 2019-07-19 LAB — GLUCOSE, CAPILLARY
Glucose-Capillary: 177 mg/dL — ABNORMAL HIGH (ref 70–99)
Glucose-Capillary: 103 mg/dL — ABNORMAL HIGH (ref 70–99)
Glucose-Capillary: 108 mg/dL — ABNORMAL HIGH (ref 70–99)
Glucose-Capillary: 119 mg/dL — ABNORMAL HIGH (ref 70–99)

## 2019-07-19 MED ORDER — FUROSEMIDE 20 MG PO TABS
20.0000 mg | ORAL_TABLET | Freq: Every day | ORAL | Status: DC
  Administered 2019-07-20 – 2019-07-25 (×6): 20 mg via ORAL
  Filled 2019-07-19 (×6): qty 1

## 2019-07-19 MED ORDER — HEPARIN SODIUM (PORCINE) 1000 UNIT/ML IJ SOLN
INTRAMUSCULAR | Status: AC
  Administered 2019-07-19: 3800 [IU]
  Filled 2019-07-19: qty 4

## 2019-07-19 MED ORDER — DICLOFENAC SODIUM 1 % TD GEL
2.0000 g | Freq: Four times a day (QID) | TRANSDERMAL | Status: DC
  Administered 2019-07-20 – 2019-08-03 (×44): 2 g via TOPICAL
  Filled 2019-07-19 (×3): qty 100

## 2019-07-19 NOTE — Progress Notes (Signed)
Physical Therapy Session Note  Patient Details  Name: Kelly Key MRN: [id number] Date of Birth: 1971/10/12  Today's Date: 07/19/2019 PT Individual Time: [id number] PT Individual Time Calculation (min): 55 min   Short Term Goals: Week 1:  PT Short Term Goal 1 (Week 1): Pt will perform sit<>stand with min assist and LRAD PT Short Term Goal 2 (Week 1): Pt will ambulate  25 ft with min assist and LRAD PT Short Term Goal 3 (Week 1): Pt will perform bed<>chair transfer with min assist and LRAD  Skilled Therapeutic Interventions/Progress Updates:   Pt received sitting in w/c in room, agreeable to therapy session.  Pt reports he is unable to self propel w/c due to pain in hands today.  PT assisted to day room for time management and pain.  Performed 2 bouts of gait with RW (mod A but +2 for w/c follow) with cues for upright posture, shoulder depression and safety with keeping RW closer to him during gait.  Note difficulty advancing RLE at times and requires maximal effort for pt to ambulate.  Performed 15' x 1 and another 10' x 1.  During seated rest break, had pt perform unsupported (back) LAQs x 10 reps with 5 sec hold on each side.  Cues for improved breath control.  Returned to mat and transferred with RW at min A level with cues for safety with RW and hand placement.  Pt able to get into supine position at S level.  Performed supine therex as follows: hamstring stretch (one knee bent, while one knee straight-extremely tight so this was large stretch) x 30 secs x 2 reps on each side, heel slides x 10 reps on each side, quad set x 5 reps on each side, L SL R hip abd x 10 reps, hooklying resisted L hip abd (due to not being able to lie on R side) x 10 reps, sit<>stand x 5 reps with cues for safety and technique.  Transferred back to w/c with min/mod A.  Assisted back to room and left in w/c with seat belt alarm donned and all needs in reach.   Therapy Documentation Precautions:  Precautions Precautions:  Fall Restrictions Weight Bearing Restrictions: No   Pain: Pain Assessment Pain Scale: Faces Pain Score: 0-No pain    Therapy/Group: Individual Therapy  Denice Bors 07/19/2019, 11:02 AM

## 2019-07-19 NOTE — Progress Notes (Signed)
[facility] PHYSICAL MEDICINE & REHABILITATION PROGRESS NOTE  Subjective/Complaints: Patient seen sitting up at the edge of his bed this morning working with therapies.  Discussed orthostasis with therapies.  He states he slept well overnight.  He notes improvement in right wrist pain.  He is questions regarding dialysis access.    ROS: Denies CP, shortness of breath, nausea, vomiting, diarrhea.  Objective: Vital Signs: Blood pressure (!) 109/59, pulse 74, temperature 98 F (36.7 C), temperature source Oral, resp. rate 15, height 5\' 7"  (1.702 m), weight (!) 138.2 kg, SpO2 97 %. Dg Wrist Complete Right  Result Date: 07/17/2019 CLINICAL DATA:  Pain, swelling, history of gout, no injury EXAM: RIGHT WRIST - COMPLETE 3+ VIEW COMPARISON:  None. FINDINGS: No fracture or dislocation of the right wrist. The carpus is normally aligned. No significant arthritic change. There is no obvious bony erosion or sclerosis to suggest erosive arthropathy. Soft tissue edema about the wrist. IMPRESSION: No fracture or dislocation of the right wrist. The carpus is normally aligned. No significant arthritic change. There is no obvious bony erosion or sclerosis to suggest erosive arthropathy. Soft tissue edema about the wrist. Electronically Signed   By: Eddie Candle M.D.   On: 07/17/2019 17:37   Recent Labs    07/16/19 1640  WBC 8.6  HGB 10.1*  HCT 32.5*  PLT 407*   Recent Labs    07/17/19 0649  NA 133*  K 4.5  CL 93*  CO2 24  GLUCOSE 140*  BUN 87*  CREATININE 7.16*  CALCIUM 9.1    Physical Exam: BP (!) 109/59 (BP Location: Left Arm)   Pulse 74   Temp 98 F (36.7 C) (Oral)   Resp 15   Ht 5\' 7"  (1.702 m)   Wt (!) 138.2 kg   SpO2 97%   BMI 47.72 kg/m  Constitutional: No distress . Vital signs reviewed.  Morbidly obese. HENT: Normocephalic.  Atraumatic. Eyes: EOMI. No discharge. Cardiovascular: No JVD. Respiratory: Normal effort.  No stridor. GI: Non-distended. Skin: Warm and dry.   Intact. Psych: Flat. Musc: Right wrist with radial tenderness, improving Neurological: Alert  Bilateral upper extremities: 4/5 proximal to distal, improving Bilateral lower extremities: 4--4/5 proximal to distal  Skin: Vascular changes bilateral lower extremities   Assessment/Plan: 1. Functional deficits secondary to debility which require 3+ hours per day of interdisciplinary therapy in a comprehensive inpatient rehab setting.  Physiatrist is providing close team supervision and 24 hour management of active medical problems listed below.  Physiatrist and rehab team continue to assess barriers to discharge/monitor patient progress toward functional and medical goals  Care Tool:  Bathing  Bathing activity did not occur: Safety/medical concerns Body parts bathed by patient: Right arm, Left arm, Chest, Abdomen   Body parts bathed by helper: Right arm, Left arm, Face(Did not attempt other parts)     Bathing assist Assist Level: Set up assist     Upper Body Dressing/Undressing Upper body dressing   What is the patient wearing?: Pull over shirt    Upper body assist Assist Level: Set up assist    Lower Body Dressing/Undressing Lower body dressing    Lower body dressing activity did not occur: N/A What is the patient wearing?: Pants     Lower body assist Assist for lower body dressing: Moderate Assistance - Patient 50 - 74%     Toileting Toileting Toileting Activity did not occur (Clothing management and hygiene only): N/A (no void or bm)  Toileting assist Assist for toileting: Moderate  Assistance - Patient 50 - 74%     Transfers Chair/bed transfer  Transfers assist     Chair/bed transfer assist level: Moderate Assistance - Patient 50 - 74%     Locomotion Ambulation   Ambulation assist      Assist level: 2 helpers(mod +2 for w/c follow L LE weakness) Assistive device: Walker-rolling Max distance: 6 ft   Walk 10 feet activity   Assist  Walk 10 feet  activity did not occur: Safety/medical concerns        Walk 50 feet activity   Assist Walk 50 feet with 2 turns activity did not occur: Safety/medical concerns         Walk 150 feet activity   Assist Walk 150 feet activity did not occur: Safety/medical concerns         Walk 10 feet on uneven surface  activity   Assist Walk 10 feet on uneven surfaces activity did not occur: Safety/medical concerns         Wheelchair     Assist Will patient use wheelchair at discharge?: No             Wheelchair 50 feet with 2 turns activity    Assist            Wheelchair 150 feet activity     Assist            Medical Problem List and Plan: 1.  Generalized weakness with limitations in self-care secondary to debility  Continue CIR 2.  Antithrombotics: -DVT/anticoagulation:  Pharmaceutical: Heparin             -antiplatelet therapy: On ASA 81 mg daily 3. Pain Management: On gabapentin 3 times daily for neuropathy with hydrocodone on PRN basis             Voltaren gel ordered for right wrist on 9/22 4. Mood: LCSW to follow for evaluation and support             -antipsychotic agents: N/A 5. Neuropsych: This patient is capable of making decisions on his own behalf. 6. Skin/Wound Care: Routine pressure relief measures 7. Fluids/Electrolytes/Nutrition: CM/renal diet with 1200 cc FR.   - pt's electrolytes and BMP is able 8.  T2DM with hyperglycemia: Hemoglobin A1c-12.4 and poorly controlled.  Continue Levemir twice daily and use SSI for tighter blood sugar control.  Monitor blood sugars AC/HS.   Labile on 9/22             Monitor with increased mobility. 9.  Hypertension: Monitor BP TID--follow-up with [name] for work-up to rule out RAS. Continue amlodipine, Coreg twice daily, clonidine TID    Furosemide decreased to daily 20 on 9/22  Now with symptomatic orthostasis 10.  Anemia of chronic disease: Received IV iron             Labs with  HD  Hemoglobin 10.1 on 9/19 11.  ESRD/chronic combined CHF: Anasarca multifactorial and improving with hemodialysis.             Daily weights Filed Weights   07/17/19 2155 07/18/19 0203 07/19/19 0455  Weight: 135.5 kg 131.8 kg (!) 138.2 kg   ?Reliability on 9/22 12.  Right wrist pain-likely sprain/strain  X-ray personally reviewed, showing edema, but otherwise unremarkable  Uric acid within normal limits  Voltaren gel ordered on 9/22  LOS: 3 days A FACE TO FACE EVALUATION WAS PERFORMED  Leelan Rajewski Lorie Phenix 07/19/2019, 10:03 AM

## 2019-07-19 NOTE — Progress Notes (Signed)
Occupational Therapy Session Note  Patient Details  Name: Kelly Key MRN: [id number] Date of Birth: 1971-05-25  Today's Date: 07/19/2019 OT Individual Time: [id number] OT Individual Time Calculation (min): 45 min    Short Term Goals: Week 1:  OT Short Term Goal 1 (Week 1): Pt will don pants with min A OT Short Term Goal 2 (Week 1): Pt will complete sit <> stand with min A OT Short Term Goal 3 (Week 1): Pt will transfer to [facility] with min A OT Short Term Goal 4 (Week 1): Pt will demonstrate increased endurance and stand for 1 grooming activity with min A  Skilled Therapeutic Interventions/Progress Updates:    Pt completed supine to sit EOB with supervision with HOB elevated 45 degrees.  He then completed stand pivot transfer to the wheelchair with use of the RW and min assist.  Increased trunk flexion noted in standing with increased BUE support on the RW.   Once in the wheelchair he was able to complete grooming tasks of shaving his face and head, cleaning his dentures, and brushing his other teeth with setup only.  He was able to then donn his pullover shirt with setup as well.  Pt left in the wheelchair with call button and phone in reach and safety belt in place.    Therapy Documentation Precautions:  Precautions Precautions: Fall Restrictions Weight Bearing Restrictions: No    Vital Signs: Therapy Vitals Temp: 98 F (36.7 C) Temp Source: Oral Pulse Rate: 74 Resp: 15 BP: (!) 109/59 Patient Position (if appropriate): Lying Oxygen Therapy SpO2: 97 % O2 Device: Room Air Pain: Pain Assessment Pain Scale: Faces Pain Score: 0-No pain ADL: See Car tool section for some details of selfcare and mobility  Therapy/Group: Individual Therapy  Oriana Horiuchi OTR/L 07/19/2019, 8:51 AM

## 2019-07-19 NOTE — Progress Notes (Signed)
Occupational Therapy Session Note  Patient Details  Name: Kelly Key MRN: [id number] Date of Birth: 05-Jul-1971  Today's Date: 07/19/2019 OT Individual Time: 0705-0730 and 1100-1200 OT Individual Time Calculation (min): 25 min and 60 mins   Short Term Goals: Week 1:  OT Short Term Goal 1 (Week 1): Pt will don pants with min A OT Short Term Goal 2 (Week 1): Pt will complete sit <> stand with min A OT Short Term Goal 3 (Week 1): Pt will transfer to [facility] with min A OT Short Term Goal 4 (Week 1): Pt will demonstrate increased endurance and stand for 1 grooming activity with min A Skilled Therapeutic Interventions/Progress Updates:    Session 1:  Upon entering the room, pt supine in bed and eating breakfast. Pt declines self care tasks this session as well as transfer from bed. OT reviews pt's goals and OT purpose with pt verbalizing understanding. Energy conservation education started as well.  Plan made for afternoon session. Pt remained in bed with bed alarm activated and call bell within reach.   Session 2: Upon entering the room, pt seated in wheelchair and requesting to shower. OT covering dialysis port for safety and assisted pt via wheelchair into bathroom. Pt needing min A for stand pivot transfer onto TTB with min cuing for hand placement. Pt bathing from seated position with use of LH reacher to increase independence with self care tasks. Lateral leans to wash buttocks himself this session. Pt returning to wheelchair and seated at sink for dressing tasks. Sit <> stand at sink with min A and min A for standing balance during LB clothing management. Pt declined education on use of wide sock aide. OT provided total A to don B no slip socks. Pt very emotional and upset over upcoming procedure for dialysis. OT encouraged pt to ask questions to neurology and be an advocate for his care. However, unable to redirect pt from this topic. Pt remained seated in wheelchair with chair alarm donned and call  bell within reach upon exiting the room.   Therapy Documentation Precautions:  Precautions Precautions: Fall Restrictions Weight Bearing Restrictions: No General:   Vital Signs:  Pain: Pain Assessment Pain Scale: Faces Pain Score: 0-No pain Other Treatments:     Therapy/Group: Individual Therapy  Gypsy Decant 07/19/2019, 8:59 AM

## 2019-07-19 NOTE — Progress Notes (Addendum)
[facility] Progress Note    Assessment/ Plan:   Kelly Key an 48 y.o.malewitha PMH significant for dCHF, DM, HTN, HLD, morbid obesity, fatty liver disease, and CKD stage 4 followed by Dr. Lowanda Foster with nephrotic range proteinuria (documented in past as >11 grams/24 hours) who was recently hospitalized on 06/22/2019 [facility] with a 2 week history of worsening lower extremity edema and abdominal girth not responsive to increasing doses of torsemide as an outpatient. He has had accelerated HTN and workup for renal artery stenosis was underway with [name] however that has not been scheduled.During that hospitalization there were extensive discussions with the pt educating him about the need to initiate dialysis but he refused wanting to give a trial with outpt diuretics. In the past Kelly Key hadexpressed that he would "rather die than go on dialysis" and is followed by Dr. Lowanda Foster, however on further discussion he always states that he's not ready to die. Patient went home with torsemide 60mg  BID and unfortunately did not respond even though he was compliant.   1. End-stage renal disease. Status post [facility] 07/08/2019 and first HD. 3rd HD 9/15, now on TTS.  Discussed permanent access again with him today-->he still wants to hold off and wants me to return tomorrow to talk about it. On 9/21 we discussed PD and HD -> he's leaning away from PD.  Seen on HD 2K 140/83 UF 3L net goal  RIJ TC  2. Hypertension. Should improve with ultrafiltration. On home dose of amlodipine.  3. Acute on chronic diastolic congestive heart failure. Improving with dialysis.  4. Malignant hypertension. Will monitor blood pressure with dialysis. Doubt intervention on any significant renal artery stenosis would make a difference in his renal function.   5. Diabetes mellitus. Overall poor control with a hemoglobin A1c of 12.4.  6. Volume overload. Should improve with  ultrafiltration on dialysis.  7. Anemia. Is iron replete. Hgb 10.1, will follow and dose Aranesp as appropriate- no ESA yet.   8. BMD. Cont phosphate binders.  9. To CIR, CLIP  Subjective:   Swelling much better as is the dyspnea.   Objective:   BP (!) 169/77   Pulse 81   Temp 98 F (36.7 C) (Oral)   Resp 18   Ht 5\' 7"  (1.702 m)   Wt 134.4 kg   SpO2 99%   BMI 46.41 kg/m   Intake/Output Summary (Last 24 hours) at 07/19/2019 1531 Last data filed at 07/19/2019 1300 Gross per 24 hour  Intake 360 ml  Output 1750 ml  Net -1390 ml   Weight change: 2.7 kg  Physical Exam: General: AAOx3 NAD, transferring to bed from chair Neck: No JVD CV: Heart RRR  Lungs: clear anteriorly Abd: abd SNT/ND with normal BS Extremities: +1 bilateral lower extremity edema. ACCESS: [facility]  Imaging: Dg Wrist Complete Right  Result Date: 07/17/2019 CLINICAL DATA:  Pain, swelling, history of gout, no injury EXAM: RIGHT WRIST - COMPLETE 3+ VIEW COMPARISON:  None. FINDINGS: No fracture or dislocation of the right wrist. The carpus is normally aligned. No significant arthritic change. There is no obvious bony erosion or sclerosis to suggest erosive arthropathy. Soft tissue edema about the wrist. IMPRESSION: No fracture or dislocation of the right wrist. The carpus is normally aligned. No significant arthritic change. There is no obvious bony erosion or sclerosis to suggest erosive arthropathy. Soft tissue edema about the wrist. Electronically Signed   By: Eddie Candle M.D.   On: 07/17/2019 17:37  Labs: BMET Recent Labs  Lab 07/13/19 0456 07/14/19 0400 07/15/19 0201 07/15/19 0555 07/17/19 0649 07/19/19 1410  NA 133* 131* 129* 134* 133* 131*  K 4.3 4.4 4.4 4.0 4.5 4.2  CL 95* 91* 90* 97* 93* 90*  CO2 23 22 22 23 24 25   GLUCOSE 147* 150* 196* 144* 140* 165*  BUN 62* 89* 106* 46* 87* 69*  CREATININE 6.10* 7.77* 8.17* 4.61* 7.16* 6.86*  CALCIUM 8.7* 8.9 9.1 8.7* 9.1 8.9  PHOS  7.6* 9.0* 9.2* 4.7* 7.7* 5.9*   CBC Recent Labs  Lab 07/15/19 0200 07/16/19 1640 07/19/19 1410  WBC 9.6 8.6 11.6*  NEUTROABS 5.0  --   --   HGB 10.7* 10.1* 9.9*  HCT 33.4* 32.5* 31.0*  MCV 77.0* 78.5* 78.1*  PLT 500* 407* 373    Medications:    . allopurinol  200 mg Oral Daily  . amLODipine  10 mg Oral Daily  . aspirin EC  81 mg Oral Daily  . atorvastatin  80 mg Oral Daily  . carvedilol  25 mg Oral BID WC  . Chlorhexidine Gluconate Cloth  6 each Topical Q0600  . cloNIDine  0.3 mg Oral TID  . diclofenac sodium  2 g Topical QID  . ezetimibe  10 mg Oral QPM  . fluticasone  1 spray Each Nare Daily  . [START ON 07/20/2019] furosemide  20 mg Oral Daily  . gabapentin  100 mg Oral TID  . heparin  5,000 Units Subcutaneous Q8H  . insulin aspart  0-20 Units Subcutaneous TID WC  . insulin aspart  0-5 Units Subcutaneous QHS  . insulin detemir  35 Units Subcutaneous BID  . linaclotide  145 mcg Oral QAC breakfast  . sevelamer carbonate  800 mg Oral TID WC      Otelia Santee, MD 07/19/2019, 3:31 PM

## 2019-07-20 ENCOUNTER — Inpatient Hospital Stay (HOSPITAL_COMMUNITY): Payer: Medicare Other

## 2019-07-20 ENCOUNTER — Inpatient Hospital Stay (HOSPITAL_COMMUNITY): Payer: Medicare Other | Admitting: Occupational Therapy

## 2019-07-20 ENCOUNTER — Encounter (HOSPITAL_COMMUNITY): Payer: Medicare Other | Admitting: Psychology

## 2019-07-20 DIAGNOSIS — D72829 Elevated white blood cell count, unspecified: Secondary | ICD-10-CM

## 2019-07-20 LAB — GLUCOSE, CAPILLARY
Glucose-Capillary: 251 mg/dL — ABNORMAL HIGH (ref 70–99)
Glucose-Capillary: 139 mg/dL — ABNORMAL HIGH (ref 70–99)
Glucose-Capillary: 191 mg/dL — ABNORMAL HIGH (ref 70–99)
Glucose-Capillary: 184 mg/dL — ABNORMAL HIGH (ref 70–99)
Glucose-Capillary: 186 mg/dL — ABNORMAL HIGH (ref 70–99)

## 2019-07-20 MED ORDER — LIVING BETTER WITH HEART FAILURE BOOK
Freq: Once | Status: AC
  Administered 2019-07-20: 13:00:00

## 2019-07-20 MED ORDER — DARBEPOETIN ALFA 60 MCG/0.3ML IJ SOSY
60.0000 ug | PREFILLED_SYRINGE | INTRAMUSCULAR | Status: DC
  Filled 2019-07-20 (×2): qty 0.3

## 2019-07-20 MED ORDER — LIVING WELL WITH DIABETES BOOK
Freq: Once | Status: AC
  Administered 2019-07-20: 13:00:00
  Filled 2019-07-20: qty 1

## 2019-07-20 MED ORDER — CARVEDILOL 25 MG PO TABS
25.0000 mg | ORAL_TABLET | Freq: Two times a day (BID) | ORAL | Status: DC
  Administered 2019-07-21 – 2019-07-22 (×2): 25 mg via ORAL
  Filled 2019-07-20 (×3): qty 1

## 2019-07-20 MED ORDER — SEVELAMER CARBONATE 800 MG PO TABS
1600.0000 mg | ORAL_TABLET | Freq: Three times a day (TID) | ORAL | Status: DC
  Administered 2019-07-20 – 2019-07-25 (×13): 1600 mg via ORAL
  Filled 2019-07-20 (×14): qty 2

## 2019-07-20 MED ORDER — ALBUMIN HUMAN 25 % IV SOLN
25.0000 g | Freq: Once | INTRAVENOUS | Status: DC
  Filled 2019-07-20: qty 100

## 2019-07-20 MED ORDER — BLOOD PRESSURE CONTROL BOOK
Freq: Once | Status: AC
  Administered 2019-07-20: 13:00:00
  Filled 2019-07-20: qty 1

## 2019-07-20 NOTE — Patient Care Conference (Addendum)
Inpatient RehabilitationTeam Conference and Plan of Care Update Date: 07/20/2019   Time: 11:00 AM    Patient Name: Kelly Key      Medical Record Number: [id number]  Date of Birth: September 24, 1971 Sex: Male         Room/Bed: 4W23C/4W23C-01 Payor Info: Payor: MEDICARE / Plan: MEDICARE PART A AND B / Product Type: *No Product type* /    Admit Date/Time:  07/16/2019  2:01 PM  Primary Diagnosis:  Physical debility  Patient Active Problem List   Diagnosis Date Noted  . Leukocytosis   . Muscle strain of right wrist   . Labile blood glucose   . Pain   . Physical debility 07/16/2019  . ESRD on dialysis ([place])   . Anemia of chronic disease   . Uncontrolled type 2 diabetes mellitus with hyperglycemia ([place])   . Congestive heart failure ([place])   . Volume overload 07/05/2019  . Anasarca 07/03/2019  . Dyslipidemia 07/03/2019  . Chronic combined systolic and diastolic CHF (congestive heart failure) ([place]) 07/03/2019  . Bilateral leg edema   . CKD (chronic kidney disease) stage 5, GFR less than 15 ml/min (H[facility]) 06/22/2019  . Uncontrolled type 2 diabetes mellitus with hyperglycemia, with long-term current use of insulin ([place]) 06/22/2019  . Hypokalemia   . Thromboembolism ([place]) 02/20/2019  . AKI (acute kidney injury) ([place]) 02/20/2019  . Acute on chronic diastolic CHF (congestive heart failure) ([place]) 03/26/2018  . Acute respiratory failure with hypoxia ([place]) 03/26/2018  . Hyponatremia 06/12/2012  . Microcytic anemia 06/12/2012  . HTN (hypertension) 06/11/2012  . Elevated LFTs 06/11/2012  . Morbid obesity ([place]) 06/11/2012  . Hepatic steatosis 06/11/2012    Expected Discharge Date: Expected Discharge Date: 08/03/19  Team Members Present: Physician leading conference: Dr. Delice Lesch Social Worker Present: Ovidio Kin, LCSW Nurse Present: Dorien Chihuahua, RN PT Present: Michaelene Song, PT OT Present: Clyda Greener, OT SLP Present: Weston Anna, SLP PPS Coordinator present : Gunnar Fusi,  SLP     Current Status/Progress Goal Weekly Team Focus  Bowel/Bladder   Patient is continent of bowel, LBM 07/18/19, and Oliguric, HD -T/TH/Sat  Maintain normal bowel patterns, cont HD  Assess toileting  needs qs and prn bowel, HD monitoring Tues/Thurs//Saturday   Swallow/Nutrition/ Hydration             ADL's   Pt completes UB selfcare with supervision.  LB selfcare sit to stand with overall mod assist.  Toilet transfers with mod assist as well using the RW for support.  Supervision overall  selfcare retraining, transfer training, balance retraining, DME eduction, pt education, therapeutic exercise   Mobility   min A sit<>stand, min/mod A for stand pivot transfer, mod A gait with RW (+2 w/c follow as pt fatigues quickly)  S overall  endurance, BLE strengthening, gait training, pt education, transfer training, balance   Communication             Safety/Cognition/ Behavioral Observations            Pain   Pain in bilateral legs and feet, prn Norco 5-325 1-2 po  < 3  QS assessment and f/u of pain   Skin   No skin issues  Maintain skin integrity  Assess patient qs and prn      *See Care Plan and progress notes for long and short-term goals.     Barriers to Discharge  Current Status/Progress Possible Resolutions Date Resolved   Nursing  Other (comments)  PT  Hemodialysis                 OT Decreased caregiver support                SLP                SW Behavior Self limiting and non-compliant            Discharge Planning/Teaching Needs:  HOme with fiance and sister to assist with his care. May be 1-2 hours alone. Feeling better and able to participate in therapies.      Team Discussion:  Goals supervision level, currently min-mod level. Having Bp issues today. Fatigues quickly and having wrist pain. MD is aware and ordered voltaren gel for this. Agreeable to fistula and awaiting when will be done.   Revisions to Treatment Plan:  DC 10/7    Medical  Summary Current Status: Generalized weakness with limitations in self-care secondary to debility Weekly Focus/Goal: Improve mobility, DM, right wrist pain, leukocytosis  Barriers to Discharge: Hemodialysis;Medical stability;Weight   Possible Resolutions to Barriers: Therapies, access for HD, optimize DM meds, meds for wrist, follow labs   Continued Need for Acute Rehabilitation Level of Care: The patient requires daily medical management by a physician with specialized training in physical medicine and rehabilitation for the following reasons: Direction of a multidisciplinary physical rehabilitation program to maximize functional independence : Yes Medical management of patient stability for increased activity during participation in an intensive rehabilitation regime.: Yes Analysis of laboratory values and/or radiology reports with any subsequent need for medication adjustment and/or medical intervention. : Yes   I attest that I was present, lead the team conference, and concur with the assessment and plan of the team. Teleconference held due to COVID 19   Mykelle Cockerell, Gardiner Rhyme 07/20/2019, 1:42 PM

## 2019-07-20 NOTE — Progress Notes (Signed)
[facility] Progress Note    Assessment/ Plan:   Kelly Key an 48 y.o.malewitha PMH significant for dCHF, DM, HTN, HLD, morbid obesity, fatty liver disease, and CKD stage 4 followed by Dr. Lowanda Foster with nephrotic range proteinuria (documented in past as >11 grams/24 hours) who was recently hospitalized on 06/22/2019 [facility] with a 2 week history of worsening lower extremity edema and abdominal girth not responsive to increasing doses of torsemide as an outpatient. He has had accelerated HTN and workup for renal artery stenosis was underway with [name] however that has not been scheduled.During that hospitalization there were extensive discussions with the pt educating him about the need to initiate dialysis but he refused wanting to give a trial with outpt diuretics. In the past [name] hadexpressed that he would "rather die than go on dialysis" and is followed by Dr. Lowanda Foster, however on further discussion he always states that he's not ready to die. Patient went home with torsemide 60mg  BID and unfortunately did not respond even though he was compliant.   1. End-stage renal disease. Status post [facility] 07/08/2019 and first HD. 3rd HD 9/15, now on TTS.  Discussed permanent access again with him today-->he is finally willing to get perm access. After d/w CIR MD we will hold off till he's d/c to facilitate rehab.  Next HD Thur  2. Hypertension. Should improve with ultrafiltration. On home dose of amlodipine.  3. Acute on chronic diastolic congestive heart failure. Improving with dialysis.  4. Malignant hypertension. Will monitor blood pressure with dialysis. Doubt intervention on any significant renal artery stenosis would make a difference in his renal function.   5. Diabetes mellitus. Overall poor control with a hemoglobin A1c of 12.4.  6. Volume overload. Should improve with ultrafiltration on dialysis.  7. Anemia. Is iron replete. Hgb  10.1, will follow and dose Aranesp as appropriate- Aranesp 60 qweek  8. BMD. Cont phosphate binders -> incr to renvela 2tabs TIDM on 9/23.  9. To CIR, CLIP  Subjective:   Swelling much better as is the dyspnea and tolerating CIR.   Objective:   BP 113/65 (BP Location: Right Arm)   Pulse 80   Temp 98 F (36.7 C) (Oral)   Resp 16   Ht 5\' 7"  (1.702 m)   Wt 135 kg   SpO2 97%   BMI 46.61 kg/m   Intake/Output Summary (Last 24 hours) at 07/20/2019 1237 Last data filed at 07/20/2019 0800 Gross per 24 hour  Intake 520 ml  Output 3800 ml  Net -3280 ml   Weight change: -3.8 kg  Physical Exam: General: AAOx3 NAD Neck: No JVD CV: Heart RRR  Lungs: clear anteriorly Abd: abd SNT/ND with normal BS Extremities: +1 bilateral lower extremity edema. ACCESS: R IJ TDC  Imaging: No results found.  Labs: BMET Recent Labs  Lab 07/14/19 0400 07/15/19 0201 07/15/19 0555 07/17/19 0649 07/19/19 1410  NA 131* 129* 134* 133* 131*  K 4.4 4.4 4.0 4.5 4.2  CL 91* 90* 97* 93* 90*  CO2 22 22 23 24 25   GLUCOSE 150* 196* 144* 140* 165*  BUN 89* 106* 46* 87* 69*  CREATININE 7.77* 8.17* 4.61* 7.16* 6.86*  CALCIUM 8.9 9.1 8.7* 9.1 8.9  PHOS 9.0* 9.2* 4.7* 7.7* 5.9*   CBC Recent Labs  Lab 07/15/19 0200 07/16/19 1640 07/19/19 1410  WBC 9.6 8.6 11.6*  NEUTROABS 5.0  --   --   HGB 10.7* 10.1* 9.9*  HCT 33.4* 32.5* 31.0*  MCV  77.0* 78.5* 78.1*  PLT 500* 407* 373    Medications:    . allopurinol  200 mg Oral Daily  . amLODipine  10 mg Oral Daily  . aspirin EC  81 mg Oral Daily  . atorvastatin  80 mg Oral Daily  . blood pressure control book   Does not apply Once  . carvedilol  25 mg Oral BID WC  . Chlorhexidine Gluconate Cloth  6 each Topical Q0600  . cloNIDine  0.3 mg Oral TID  . diclofenac sodium  2 g Topical QID  . ezetimibe  10 mg Oral QPM  . fluticasone  1 spray Each Nare Daily  . furosemide  20 mg Oral Daily  . gabapentin  100 mg Oral TID  . heparin  5,000  Units Subcutaneous Q8H  . insulin aspart  0-20 Units Subcutaneous TID WC  . insulin aspart  0-5 Units Subcutaneous QHS  . insulin detemir  35 Units Subcutaneous BID  . linaclotide  145 mcg Oral QAC breakfast  . Living Better with Heart Failure Book   Does not apply Once  . living well with diabetes book   Does not apply Once  . sevelamer carbonate  800 mg Oral TID WC      Otelia Santee, MD 07/20/2019, 12:37 PM

## 2019-07-20 NOTE — Progress Notes (Signed)
Orthopedic Tech Progress Note Patient Details:  Kelly Key 1971-06-14 [id number] RN called requesting an ABDOMINAL BINDER XL for patient. I applied it with wife at bedside Ortho Devices Type of Ortho Device: Abdominal binder Ortho Device/Splint Location: Stomach Ortho Device/Splint Interventions: Adjustment, Application, Ordered   Post Interventions Patient Tolerated: Well Instructions Provided: Care of device, Adjustment of device   [name] 07/20/2019, 2:50 PM

## 2019-07-20 NOTE — Progress Notes (Signed)
Physical Therapy Session Note  Patient Details  Name: Kelly Key MRN: [id number] Date of Birth: 04-19-1971  Today's Date: 07/20/2019 PT Individual Time: 0802-0900 and 1530-1616 PT Individual Time Calculation (min): 58 min and 45 min Missed time: 15 minutes (fatigue)  Short Term Goals: Week 1:  PT Short Term Goal 1 (Week 1): Pt will perform sit<>stand with min assist and LRAD PT Short Term Goal 2 (Week 1): Pt will ambulate  25 ft with min assist and LRAD PT Short Term Goal 3 (Week 1): Pt will perform bed<>chair transfer with min assist and LRAD  Skilled Therapeutic Interventions/Progress Updates:    Session 1: Pt seated in w/c upon PT arrival, agreeable to therapy tx and reports pain in R wrist 9/10. Pt unable to propel w/c to the gym this session secondary to wrist pain. Pt reports he is tired this morning, he could not get comfortable in the bed and did not sleep much. Pt performed stand pivot to the w/c with mod assist, cues for techniques. Throughout the session pt performed x 5 sit<>stands from elevated mat with RW and mod assist, cues for techniques and hand placement, pt requiring seated rest breaks between bouts of standing secondary to fatigue, pt only able to maintain standing position 10-25 sec. Seated edge of mat pt performed 2 x 10 LAQ and 2 x 10 seated marches for LE strength. Therapist performed B LE hamstring and heel cord stretch, 2 x 30 sec each. Pt performed stand pivot to w/c with mod assist, transported to the dayroom. Pt used kinetron this session while seated in w/c for LE strengthening 3 x 50 sec bouts on 20 cm/sec resistance. Pt transported back to room and left with needs in reach and chair alarm set.   Session 2: Pt supine in bed upon PT arrival, pt reports he still feels somewhat dizzy and is fatigued. Pt's BP dropped during previous session with OOB activity. Pt now has abdominal binder donned, therapist donned ace wraps to B LEs for compression. BP in supine 117/75  and HR 79 bpm. Pt performed supine exercises for strengthening, 2 x 10 each with cues for techniques: assisted SLR, heel slides, ankle pumps and hip abduction/adduction. Pt agreeable to try sitting EOB. Pt transferred to sitting EOB with supervision, pt reports mild dizziness upon sitting. BP checked in sitting- 115/73 and HR 79 bpm. Pt maintained sitting EOB x 10 minutes this session working on activity tolerance- while discussing d/c planning, energy conservation, and considerations for dialysis. While sitting EOB pt performed 2 x 10 LAQ and x 20 marches. Deferred standing activities secondary to low BP and fatigue. Pt transferred back to supine and left with needs in reach and significant other present. Pt missed 15 minutes of skilled therapy tx secondary to fatigue.   Therapy Documentation Precautions:  Precautions Precautions: Fall Restrictions Weight Bearing Restrictions: No    Therapy/Group: Individual Therapy  Netta Corrigan , PT, DPT 07/20/2019, 8:32 AM

## 2019-07-20 NOTE — Consult Note (Signed)
Neuropsychological Consultation   Patient:   Kelly Key   DOB:   26-Oct-1971  MR Number:  [id number]  Location:  [place] [id number] [place] [address] Dept: [place]: [phone]           Date of Service:   07/20/2019  Start Time:   9 AM End Time:   10 AM  Provider/Observer:  Ilean Skill, Psy.D.       Clinical Neuropsychologist       Billing Code/Service: [phone]  Chief Complaint:    Kelly Key is a 48 year old male with history of T2DM, malignant hypertension, morbid obesity, CKD state IV.  Admitted to [facility] 8/26 with two week history of worsening of peripheral edema and abdominal girth.  Patient started aggressive IV diuresis for acute on chronic renal failure and recommended dialysis.  Patient initially declined this.  Readmitted on 07/03/2019 due to increasing lower extremity edema, difficulty with ambulation and lying flat as well as foot pain.  Ultimately agreed to dialysis.  Patient still concerned about dialysis and what that means but is getting to the point of being agreeable for placement of permanent access.  Reason for Service:  HPI: Kelly Key is a 48 year old male with history of T2DM--poorly controlled, malignant hypertension, morbid obesity, CKD stage IV who was recently admitted to [facility] 8/26- 06/27/19  2-week history of worsening of peripheral edema and abdominal girth.  History taken from chart review and patient.  He was started aggressive IV diuresis for acute on chronic renal failure with anasarca and Nephrology recommended dialysis however patient adamantly declined this and preferred to go home with increase in diuretics.  He was readmitted on 07/03/2019 due to increasing lower extremity edema, difficulty with ambulation and lying flat as well as foot pain.  He ultimately was agreeable to dialysis.  Tunneled HD catheter placed by [name] on 07/08/2019 and hemodialysis  initiated.  Blood pressures remain labile and MRA renal arteries ordered to rule out stenosis but nephrology doubts any intervention would make a difference in renal status.  To follow-up with [name] after discharge he continues to refuse placement of permanent access.  Acute on chronic diastolic failure improving with dialysis with decrease in anasarca.  He continues to have issues with debility affecting ADLs and mobility.  CIR recommended due to recent functional decline.   Current Status:  Patient with hesitation for permanent access placement for dialysis is changing and ready to talk with nephology about this procedure.  Anxiety about multiple needle sticks is big driver of his hesitation around dialysis.    Behavioral Observation: Kelly Key  presents as a 48 y.o.-year-old Right African American Male who appeared his stated age. his dress was Appropriate and he was Well Groomed and his manners were Appropriate to the situation.  his participation was indicative of Appropriate and Redirectable behaviors.  There were any physical disabilities noted.  he displayed an appropriate level of cooperation and motivation.     Interactions:    Active Appropriate and Redirectable  Attention:   abnormal and attention span appeared shorter than expected for age  Memory:   within normal limits; recent and remote memory intact  Visuo-spatial:  not examined  Speech (Volume):  low  Speech:   normal; normal  Thought Process:  Coherent and Relevant  Though Content:  WNL; not suicidal and not homicidal  Orientation:   person, place, time/date and  situation  Judgment:   Fair  Planning:   Poor  Affect:    Anxious  Mood:    Anxious  Insight:   Fair  Intelligence:   low  Medical History:   Past Medical History:  Diagnosis Date  . Acute diastolic CHF (congestive heart failure) ([place]) 06/11/2012   EF 50-55% [facility])  . Diabetes mellitus    A1c 11.5 (06/11/2012).  . Gout   .  Hepatic steatosis 06/11/2012   Elevated LFTs  . Hyperlipemia   . Malignant hypertension   . Microcytic anemia 06/12/2012  . Obesity    Psychiatric History:  No prior psychiatric history but does have anxiety around medical procedures.  Family Med/Psych History:  Family History  Problem Relation Age of Onset  . Gout Mother   . Asthma Mother   . Diabetes Father   . Heart failure Father   . Diabetes Sister   . Hypertension Brother   . Pancreatic cancer Brother   . Diabetes Sister     Risk of Suicide/Violence: low Patient denies SI or HI but fear/anxiety have lead him to avoiding important medical needs.    Impression/DX:  Kelly Key is a 48 year old male with history of T2DM, malignant hypertension, morbid obesity, CKD state IV.  Admitted to [facility] 8/26 with two week history of worsening of peripheral edema and abdominal girth.  Patient started aggressive IV diuresis for acute on chronic renal failure and recommended dialysis.  Patient initially declined this.  Readmitted on 07/03/2019 due to increasing lower extremity edema, difficulty with ambulation and lying flat as well as foot pain.  Ultimately agreed to dialysis.  Patient still concerned about dialysis and what that means but is getting to the point of being agreeable for placement of permanent access.  Patient with hesitation for permanent access placement for dialysis is changing and ready to talk with nephology about this procedure.  Anxiety about multiple needle sticks is big driver of his hesitation around dialysis.         Electronically Signed   _______________________ Ilean Skill, Psy.D.

## 2019-07-20 NOTE — Progress Notes (Signed)
Patient refused PIV placement at this time. This nurse talked to him about his need for albumin, continues to refuse PIV and states he will get albumin in dialysis. This nurse spoke with Dabra RN and she indicated that he has dialysis tomorrow and she will notify MD. Notified Debra RN that she can consult VAST anytime if plans for restart change. VU. Fran Lowes, RN VAST

## 2019-07-20 NOTE — Progress Notes (Signed)
Orthopedic Tech Progress Note Patient Details:  Kelly Key [birth year] [id number] Called in order to [employer] for a RADIAL WRIST BRACE Patient ID: Kelly Key, male   DOB: 09-08-1971, 48 y.o.   MRN: [id number]   Kelly Key 07/20/2019, 10:38 AM

## 2019-07-20 NOTE — Progress Notes (Signed)
[place] PHYSICAL MEDICINE & REHABILITATION PROGRESS NOTE  Subjective/Complaints: Patient seen sitting up working with therapy this morning.  He states he slept well overnight.  He notes worsening right wrist pain, he notes that Voltaren gel was never applied.  Discussed bracing with therapies.  He states he still not decided on HD access.  ROS: Denies CP, shortness of breath, nausea, vomiting, diarrhea.  Objective: Vital Signs: Blood pressure (!) 132/92, pulse 99, temperature 98 F (36.7 C), temperature source Oral, resp. rate 16, height 5\' 7"  (1.702 m), weight 135 kg, SpO2 97 %. No results found. Recent Labs    07/19/19 1410  WBC 11.6*  HGB 9.9*  HCT 31.0*  PLT 373   Recent Labs    07/19/19 1410  NA 131*  K 4.2  CL 90*  CO2 25  GLUCOSE 165*  BUN 69*  CREATININE 6.86*  CALCIUM 8.9    Physical Exam: BP (!) 132/92 (BP Location: Left Arm)   Pulse 99   Temp 98 F (36.7 C) (Oral)   Resp 16   Ht 5\' 7"  (1.702 m)   Wt 135 kg   SpO2 97%   BMI 46.61 kg/m  Constitutional: No distress . Vital signs reviewed.  Morbidly obese. HENT: Normocephalic.  Atraumatic. Eyes: EOMI. No discharge. Cardiovascular: No JVD. Respiratory: Normal effort.  No stridor. GI: Non-distended. Skin: Warm and dry.  Intact. Psych: Flat. Musc: Right wrist with radial tenderness and edema Neurological: Alert Bilateral upper extremities: 4/5 proximal to distal, stable Bilateral lower extremities: 4--4/5 proximal to distal, stable Skin: Vascular changes bilateral lower extremities   Assessment/Plan: 1. Functional deficits secondary to debility which require 3+ hours per day of interdisciplinary therapy in a comprehensive inpatient rehab setting.  Physiatrist is providing close team supervision and 24 hour management of active medical problems listed below.  Physiatrist and rehab team continue to assess barriers to discharge/monitor patient progress toward functional and medical goals  Care  Tool:  Bathing  Bathing activity did not occur: Safety/medical concerns Body parts bathed by patient: Buttocks, Front perineal area, Right upper leg, Left upper leg, Right lower leg, Left lower leg   Body parts bathed by helper: Right arm, Left arm, Face(Did not attempt other parts)     Bathing assist Assist Level: Supervision/Verbal cueing(LH sponge)     Upper Body Dressing/Undressing Upper body dressing   What is the patient wearing?: Pull over shirt    Upper body assist Assist Level: Set up assist    Lower Body Dressing/Undressing Lower body dressing    Lower body dressing activity did not occur: N/A What is the patient wearing?: Pants     Lower body assist Assist for lower body dressing: Minimal Assistance - Patient > 75%     Toileting Toileting Toileting Activity did not occur (Clothing management and hygiene only): N/A (no void or bm)  Toileting assist Assist for toileting: Moderate Assistance - Patient 50 - 74%     Transfers Chair/bed transfer  Transfers assist     Chair/bed transfer assist level: Moderate Assistance - Patient 50 - 74%     Locomotion Ambulation   Ambulation assist      Assist level: 2 helpers(w/c follow for safety) Assistive device: Walker-rolling Max distance: 15   Walk 10 feet activity   Assist  Walk 10 feet activity did not occur: Safety/medical concerns  Assist level: 2 helpers Assistive device: Walker-rolling   Walk 50 feet activity   Assist Walk 50 feet with 2 turns activity did not  occur: Safety/medical concerns         Walk 150 feet activity   Assist Walk 150 feet activity did not occur: Safety/medical concerns         Walk 10 feet on uneven surface  activity   Assist Walk 10 feet on uneven surfaces activity did not occur: Safety/medical concerns         Wheelchair     Assist Will patient use wheelchair at discharge?: No             Wheelchair 50 feet with 2 turns  activity    Assist            Wheelchair 150 feet activity     Assist            Medical Problem List and Plan: 1.  Generalized weakness with limitations in self-care secondary to debility  Continue CIR  Team conference today to discuss current and goals and coordination of care, home and environmental barriers, and discharge planning with nursing, case manager, and therapies.  2.  Antithrombotics: -DVT/anticoagulation:  Pharmaceutical: Heparin             -antiplatelet therapy: On ASA 81 mg daily 3. Pain Management: On gabapentin 3 times daily for neuropathy with hydrocodone on PRN basis             Voltaren gel ordered for right wrist on 9/22-not applied on 9/22, unclear why 4. Mood: LCSW to follow for evaluation and support             -antipsychotic agents: N/A 5. Neuropsych: This patient is capable of making decisions on his own behalf. 6. Skin/Wound Care: Routine pressure relief measures 7. Fluids/Electrolytes/Nutrition: CM/renal diet with 1200 cc FR.   - pt's electrolytes and BMP is able 8.  T2DM with hyperglycemia: Hemoglobin A1c-12.4 and poorly controlled.  Continue Levemir twice daily and use SSI for tighter blood sugar control.  Monitor blood sugars AC/HS.   Labile on 9/23, monitor for trend             Monitor with increased mobility. 9.  Hypertension: Monitor BP TID--follow-up with [name] for work-up to rule out RAS. Continue amlodipine, Coreg twice daily, clonidine TID    Furosemide decreased to daily 20 on 9/22  Relatively controlled on 9/23 10.  Anemia of chronic disease: Received IV iron             Labs with HD  Hemoglobin 9.9 on 9/22 11.  ESRD/chronic combined CHF: Anasarca multifactorial and improving with hemodialysis.             Daily weights Filed Weights   07/19/19 1405 07/19/19 1814 07/20/19 0542  Weight: 134.4 kg 131 kg 135 kg   Stable on 9/23 12.  Right wrist pain-likely sprain/strain  X-ray personally reviewed, showing edema, but  otherwise unremarkable  Uric acid within normal limits  Voltaren gel ordered on 9/22-not applied on 9/22, unclear why  Will order brace as well 13.  Leukocytosis  WBCs 11.6 on 9/22  Continue to monitor  LOS: 4 days A FACE TO FACE EVALUATION WAS PERFORMED  Ankit Lorie Phenix 07/20/2019, 10:18 AM

## 2019-07-20 NOTE — Progress Notes (Signed)
Occupational Therapy Session Note  Patient Details  Name: CORDERIUS GASKA MRN: [id number] Date of Birth: 1971-02-27  Today's Date: 07/20/2019 OT Individual Time: [id number] OT Individual Time Calculation (min): 24 min    Short Term Goals: Week 1:  OT Short Term Goal 1 (Week 1): Pt will don pants with min A OT Short Term Goal 2 (Week 1): Pt will complete sit <> stand with min A OT Short Term Goal 3 (Week 1): Pt will transfer to [facility] with min A OT Short Term Goal 4 (Week 1): Pt will demonstrate increased endurance and stand for 1 grooming activity with min A  Skilled Therapeutic Interventions/Progress Updates:    Pt with increased dizziness when sitting in the wheelchair as therapist came in for treatment.  BP in sitting initially 88/65 and 79/59.  Returned to EOB from the wheelchair with max assist stand pivot.  Pt with increased right wrist pain noted, with inability to push up from the wheelchair with sit to stand. He  Transitioned to supine from sitting with min guard assist and nursing was notified as well as further BP checks.  Initial check in supine was 97/57 with and increase up to 110/73.  Pt left in the bed with NT and nursing in the room.  PA notified and recommended use of ace bandages on LEs as well as possible abdominal binder.    Therapy Documentation Precautions:  Precautions Precautions: Fall Restrictions Weight Bearing Restrictions: No General:   Vital Signs: Therapy Vitals Pulse Rate: 80 BP: 113/65 Patient Position (if appropriate): Lying Oxygen Therapy O2 Device: Room Air Pain: Pain Assessment Pain Scale: Faces Pain Score: 0-No pain ADL: See Care Tool Section for some details of mobility  Therapy/Group: Individual Therapy  Mikaiya Tramble OTR/L 07/20/2019, 12:20 PM

## 2019-07-20 NOTE — Progress Notes (Signed)
Occupational Therapy Session Note  Patient Details  Name: Kelly Key MRN: [id number] Date of Birth: [birth year]  Today's Date: 07/20/2019 OT Individual Time: [id number] OT Individual Time Calculation (min): 54 min    Short Term Goals: Week 1:  OT Short Term Goal 1 (Week 1): Pt will don pants with min A OT Short Term Goal 2 (Week 1): Pt will complete sit <> stand with min A OT Short Term Goal 3 (Week 1): Pt will transfer to [facility] with min A OT Short Term Goal 4 (Week 1): Pt will demonstrate increased endurance and stand for 1 grooming activity with min A  Skilled Therapeutic Interventions/Progress Updates:    Pt's BP taken in supine with 106/65 with HOB elevated 25 degrees.  Therapist ace wrapped BLEs while in bed with BP taken at 104/63 still in supine.  He transferred to the EOB with BP taken multiple times at 96/67 and 93/62.  He transferred stand pivot to the wheelchair with max assist and BP taken again at 89/56.  Took pt briefly out of the room in his wheelchair with pt reporting some dizziness but did not attempt out of chair activities secondary to his BP being so low.  He was able to complete transfer back to the bed with mod assist stand pivot and transition to supine with supervision to complete session.  Call button and phone in reach with safety alarm in place.  Pt still with significant pain in the right wrist with weightbearing so using a RW at this time will be difficult.  Discussed with nursing the likely need for an abdominal binder to help increase BP.    Therapy Documentation Precautions:  Precautions Precautions: Fall Restrictions Weight Bearing Restrictions: No   Pain: Pain Assessment Pain Scale: Faces Pain Score: 0-No pain ADL: See Care Tool Section for details   Therapy/Group: Individual Therapy  Kelly Key OTR/L 07/20/2019, 3:50 PM

## 2019-07-20 NOTE — Progress Notes (Addendum)
Social Work Patient ID: Kelly Key, male   DOB: [birth year], 47 y.o.   MRN: [id number]  Met with pt to discuss team conference goals supervision level and target discharge 10/7. He is having BP issues today in therpaie sand is not feeling well he has agreed to fistula being placed and wants to know when this will be. Work on discharge needs. Neuro-psych was beneifical and he wants to see him again.

## 2019-07-20 NOTE — Progress Notes (Signed)
New order for albumin. Explained rationale for medication administration as opposed to IVF bolus. Explained need for peripheral IV site to administer medication outside of HD. Pt refused to have IV started, stating he understood what he was told however he did not want to be stuck extra, requested albumin be given when in HD. IV team notified of need for IV saline lock for albumin. When IV nurse to unit, pt again stated he did not want to be stuck, refused for IV to be placed and noted albumin should be given during HD.[name] Margarito Liner

## 2019-07-20 NOTE — Progress Notes (Signed)
Patient has been orthostatic with low BP all afternoon. To hold BP med--discussed with Dr. Patel/nephrology who recommended 25 gram albumin infusion as this would help to better support BP.

## 2019-07-20 NOTE — Progress Notes (Signed)
Called to room, blood pressure dropped, c/o dizziness, bp checked and noted to be low, assisted back to bed and bp up to 110/. CBG wnl, pam Love notified of event, rechecked bp, ace wrap for legs as TEDs probably too tight. Margarito Liner

## 2019-07-21 ENCOUNTER — Ambulatory Visit: Payer: Medicare Other | Admitting: Vascular Surgery

## 2019-07-21 ENCOUNTER — Inpatient Hospital Stay (HOSPITAL_COMMUNITY): Payer: Medicare Other | Admitting: Occupational Therapy

## 2019-07-21 ENCOUNTER — Inpatient Hospital Stay (HOSPITAL_COMMUNITY): Payer: Medicare Other | Admitting: Rehabilitation

## 2019-07-21 DIAGNOSIS — I951 Orthostatic hypotension: Secondary | ICD-10-CM

## 2019-07-21 LAB — CBC
HCT: 29.6 % — ABNORMAL LOW (ref 39.0–52.0)
Hemoglobin: 9.5 g/dL — ABNORMAL LOW (ref 13.0–17.0)
MCH: 25.1 pg — ABNORMAL LOW (ref 26.0–34.0)
MCHC: 32.1 g/dL (ref 30.0–36.0)
MCV: 78.3 fL — ABNORMAL LOW (ref 80.0–100.0)
Platelets: 335 10*3/uL (ref 150–400)
RBC: 3.78 MIL/uL — ABNORMAL LOW (ref 4.22–5.81)
RDW: 15.6 % — ABNORMAL HIGH (ref 11.5–15.5)
WBC: 11.7 10*3/uL — ABNORMAL HIGH (ref 4.0–10.5)
nRBC: 0 % (ref 0.0–0.2)

## 2019-07-21 LAB — RENAL FUNCTION PANEL
Albumin: 2.4 g/dL — ABNORMAL LOW (ref 3.5–5.0)
Anion gap: 15 (ref 5–15)
BUN: 67 mg/dL — ABNORMAL HIGH (ref 6–20)
CO2: 23 mmol/L (ref 22–32)
Calcium: 8.6 mg/dL — ABNORMAL LOW (ref 8.9–10.3)
Chloride: 92 mmol/L — ABNORMAL LOW (ref 98–111)
Creatinine, Ser: 6.97 mg/dL — ABNORMAL HIGH (ref 0.61–1.24)
GFR calc Af Amer: 10 mL/min — ABNORMAL LOW (ref >60)
GFR calc non Af Amer: 8 mL/min — ABNORMAL LOW (ref >60)
Glucose, Bld: 151 mg/dL — ABNORMAL HIGH (ref 70–99)
Phosphorus: 5.7 mg/dL — ABNORMAL HIGH (ref 2.5–4.6)
Potassium: 4.2 mmol/L (ref 3.5–5.1)
Sodium: 130 mmol/L — ABNORMAL LOW (ref 135–145)

## 2019-07-21 LAB — GLUCOSE, CAPILLARY
Glucose-Capillary: 202 mg/dL — ABNORMAL HIGH (ref 70–99)
Glucose-Capillary: 70 mg/dL (ref 70–99)
Glucose-Capillary: 122 mg/dL — ABNORMAL HIGH (ref 70–99)

## 2019-07-21 MED ORDER — DARBEPOETIN ALFA 60 MCG/0.3ML IJ SOSY
PREFILLED_SYRINGE | INTRAMUSCULAR | Status: AC
  Administered 2019-07-21: 60 ug
  Filled 2019-07-21: qty 0.3

## 2019-07-21 MED ORDER — INSULIN DETEMIR 100 UNIT/ML ~~LOC~~ SOLN
39.0000 [IU] | Freq: Two times a day (BID) | SUBCUTANEOUS | Status: DC
  Administered 2019-07-21 – 2019-07-27 (×12): 39 [IU] via SUBCUTANEOUS
  Filled 2019-07-21 (×13): qty 0.39

## 2019-07-21 MED ORDER — HEPARIN SODIUM (PORCINE) 1000 UNIT/ML IJ SOLN
INTRAMUSCULAR | Status: AC
  Administered 2019-07-21: 21:00:00 1000 [IU]
  Filled 2019-07-21: qty 4

## 2019-07-21 NOTE — Progress Notes (Signed)
[facility] PHYSICAL MEDICINE & REHABILITATION PROGRESS NOTE  Subjective/Complaints: Patient seen sitting up in his chair working with therapy this morning.  He states he slept well overnight.  He notes improvement in right wrist pain.  Discussed orthostasis with therapies.  ROS: Denies CP, shortness of breath, nausea, vomiting, diarrhea.  Objective: Vital Signs: Blood pressure (!) 158/102, pulse 92, temperature 97.6 F (36.4 C), temperature source Oral, resp. rate (!) 28, height 5\' 7"  (1.702 m), weight 133.6 kg, SpO2 99 %. No results found. Recent Labs    07/19/19 1410  WBC 11.6*  HGB 9.9*  HCT 31.0*  PLT 373   Recent Labs    07/19/19 1410  NA 131*  K 4.2  CL 90*  CO2 25  GLUCOSE 165*  BUN 69*  CREATININE 6.86*  CALCIUM 8.9    Physical Exam: BP (!) 158/102 (BP Location: Left Arm)   Pulse 92   Temp 97.6 F (36.4 C) (Oral)   Resp (!) 28   Ht 5\' 7"  (1.702 m)   Wt 133.6 kg   SpO2 99%   BMI 46.13 kg/m  Constitutional: No distress . Vital signs reviewed.  Morbidly obese. HENT: Normocephalic atraumatic. Eyes: EOMI.  No discharge. Cardiovascular: No JVD. Respiratory: Normal effort.  No stridor. GI: Non-distended. Skin: Warm and dry.  Intact. Psych: Flat. Musc: Right wrist with mild radial tenderness and edema Neurological: Alert Bilateral upper extremities: 4/5 proximal to distal, unchanged Bilateral lower extremities: 4--4/5 proximal to distal, unchanged Skin: Vascular changes bilateral lower extremities   Assessment/Plan: 1. Functional deficits secondary to debility which require 3+ hours per day of interdisciplinary therapy in a comprehensive inpatient rehab setting.  Physiatrist is providing close team supervision and 24 hour management of active medical problems listed below.  Physiatrist and rehab team continue to assess barriers to discharge/monitor patient progress toward functional and medical goals  Care Tool:  Bathing  Bathing activity did not  occur: Safety/medical concerns Body parts bathed by patient: Front perineal area, Right upper leg, Left upper leg, Right lower leg, Left lower leg, Face   Body parts bathed by helper: Right arm, Left arm, Face(Did not attempt other parts) Body parts n/a: Buttocks, Right arm, Left arm, Chest, Abdomen   Bathing assist Assist Level: Supervision/Verbal cueing(sitting in the shower with lateral leans, washing LB only secondary to porta cath)     Upper Body Dressing/Undressing Upper body dressing   What is the patient wearing?: Pull over shirt    Upper body assist Assist Level: Set up assist    Lower Body Dressing/Undressing Lower body dressing    Lower body dressing activity did not occur: N/A What is the patient wearing?: Pants, Underwear/pull up     Lower body assist Assist for lower body dressing: Minimal Assistance - Patient > 75%     Toileting Toileting Toileting Activity did not occur (Clothing management and hygiene only): N/A (no void or bm)  Toileting assist Assist for toileting: Moderate Assistance - Patient 50 - 74%     Transfers Chair/bed transfer  Transfers assist     Chair/bed transfer assist level: Maximal Assistance - Patient 25 - 49%     Locomotion Ambulation   Ambulation assist      Assist level: 2 helpers(w/c follow for safety) Assistive device: Walker-rolling Max distance: 15   Walk 10 feet activity   Assist  Walk 10 feet activity did not occur: Safety/medical concerns  Assist level: 2 helpers Assistive device: Walker-rolling   Walk 50 feet activity  Assist Walk 50 feet with 2 turns activity did not occur: Safety/medical concerns         Walk 150 feet activity   Assist Walk 150 feet activity did not occur: Safety/medical concerns         Walk 10 feet on uneven surface  activity   Assist Walk 10 feet on uneven surfaces activity did not occur: Safety/medical concerns         Wheelchair     Assist Will patient  use wheelchair at discharge?: No             Wheelchair 50 feet with 2 turns activity    Assist            Wheelchair 150 feet activity     Assist            Medical Problem List and Plan: 1.  Generalized weakness with limitations in self-care secondary to debility  Continue CIR 2.  Antithrombotics: -DVT/anticoagulation:  Pharmaceutical: Heparin             -antiplatelet therapy: On ASA 81 mg daily 3. Pain Management: On gabapentin 3 times daily for neuropathy with hydrocodone on PRN basis             Voltaren gel ordered for right wrist on 9/22 with improvement 4. Mood: LCSW to follow for evaluation and support             -antipsychotic agents: N/A 5. Neuropsych: This patient is capable of making decisions on his own behalf. 6. Skin/Wound Care: Routine pressure relief measures 7. Fluids/Electrolytes/Nutrition: CM/renal diet with 1200 cc FR.  8.  T2DM with hyperglycemia: Hemoglobin A1c-12.4 and poorly controlled.  Continue Levemir twice daily and use SSI for tighter blood sugar control.  Monitor blood sugars AC/HS.   Levemir increased to 39 twice daily on 9/24  Elevated on 9/24             Monitor with increased mobility. 9.  Hypertension: Monitor BP TID--follow-up with [name] for work-up to rule out RAS. Continue amlodipine, Coreg twice daily, clonidine TID    Furosemide decreased to daily 20 on 9/22  Albumin infusion ordered on 9/23  Labile on 9/24 10.  Anemia of chronic disease: Received IV iron             Labs with HD  Hemoglobin 9.9 on 9/22 11.  ESRD/chronic combined CHF: Anasarca multifactorial and improving with hemodialysis.             Daily weights  Discussed with nephrology on 9/23, plan for fistula placement post discharge Filed Weights   07/19/19 1814 07/20/19 0542 07/21/19 0541  Weight: 131 kg 135 kg 133.6 kg   Stable on 9/23 12.  Right wrist pain-likely sprain/strain  X-ray personally reviewed, showing edema, but otherwise  unremarkable  Uric acid within normal limits  Voltaren gel ordered on 9/22-not applied on 9/22, unclear why  Brace pending  Improving 13.  Leukocytosis  WBCs 11.6 on 9/22, labs with HD  Continue to monitor  LOS: 5 days A FACE TO FACE EVALUATION WAS PERFORMED  Ankit Lorie Phenix 07/21/2019, 9:42 AM

## 2019-07-21 NOTE — Progress Notes (Signed)
Physical Therapy Session Note  Patient Details  Name: Kelly Key MRN: [id number] Date of Birth: 11-11-1970  Today's Date: 07/21/2019 PT Individual Time: 0950-1100 PT Individual Time Calculation (min): 70 min   Short Term Goals: Week 1:  PT Short Term Goal 1 (Week 1): Pt will perform sit<>stand with min assist and LRAD PT Short Term Goal 2 (Week 1): Pt will ambulate  25 ft with min assist and LRAD PT Short Term Goal 3 (Week 1): Pt will perform bed<>chair transfer with min assist and LRAD  Skilled Therapeutic Interventions/Progress Updates:   Pt received sitting in w/c in room, agreeable to therapy.  Pt notably frustrated/upset.  PT questioned pt about what may be upsetting him.  Pt verbalized he was frustrated with his situation and that he was too young.  PT provided encouragement and sympathy for pt.  Pt may benefit from neuropsych consult.  Will discuss with team.  Pt self propelled w/c with BUEs x 25' x 2 reps at S level with min cues for technique.  Assisted remaining distance for time management.  Once in therapy gym, performed 2 bouts of gait x 15' with min/mod A (chair placed 15' away, therefore did not have w/c follow).  Continue to provide cues for upright posture and keeping RW close enough to him to utilize UE strength.  During seated rest break, pt performed seated marching without back support x 10 reps each LE.  Performed standing while tapping aerobic step x 4 reps total with maximal effort to lift LEs and difficulty standing on LLE.  Cues for upright posture and safety with RW.  Pt reports he did not feel he could do that task again and legs being tight.  Therefore transitioned and performed B seated hamstring stretch x 2 sets of 30 secs with PT assisting to increase ankle DF.  Then performed standing task as before tapping to aerobic step x 4 reps on each side this time, again with cues for standing upright.  Performed stand pivot transfer with RW back to w/c at min A level.  Note  that Brooke Pace from [place] brought pts R wrist splint during session and fitted it on pt.  Pt reports good fit.  Did have pt propel w/c x 15' with pt reporting less pain with task.  Assisted to day room at total A for time management.  Upon PT setting up nustep, pt quickly stood to RW and began to transfer to nustep without PT assisting.  Note that w/c was not locked, therefore provided max education on safety and ensuring that PT is aware when pt wanting to stand.  He reports "wanting to see if he could do it himself."  Again, PT educated on notifying PT that he wants to stand and PT can be ready in case he does need assist and to ensure w/c locked.  Pt verbalized understanding.  Pt performed seated nustep x 5 mins with BUEs/LEs at level 3 resistance.  Pt needed cues to maintain rpm in 60's-70's as he tended to attempt to go into 90's and would then fatigue quickly.  Pt transferred back to w/c at min A level for safety and assisted back to room. Pt left in w/c with chair alarm turned on and all needs in reach.    Therapy Documentation Precautions:  Precautions Precautions: Fall Restrictions Weight Bearing Restrictions: No General:   Pain: Pt reports pain in B knees when standing/walking, improved with nustep.  Discussed that he could request Voltaren gel  be applied to knees as well as wrist.  Pt verbalized understanding.    Therapy/Group: Individual Therapy  Denice Bors 07/21/2019, 10:07 AM

## 2019-07-21 NOTE — Progress Notes (Signed)
BP meds held; patient going to HD today ; per pt his BP drops when he goes to HD.

## 2019-07-21 NOTE — Progress Notes (Signed)
[facility] Progress Note    Assessment/ Plan:   Kelly Key an 48 y.o.malewitha PMH significant for dCHF, DM, HTN, HLD, morbid obesity, fatty liver disease, and CKD stage 4 followed by Kelly Key with nephrotic range proteinuria (documented in past as >11 grams/24 hours) who was recently hospitalized on 06/22/2019 [facility] with a 2 week history of worsening lower extremity edema and abdominal girth not responsive to increasing doses of torsemide as an outpatient. He has had accelerated HTN and workup for renal artery stenosis was underway with Kelly Key however that has not been scheduled.During that hospitalization there were extensive discussions with the pt educating him about the need to initiate dialysis but he refused wanting to give a trial with outpt diuretics. In the past Kelly Key hadexpressed that he would "rather die than go on dialysis" and is followed by Kelly Key, however on further discussion he always states that he's not ready to die. Patient went home with torsemide 60mg  BID and unfortunately did not respond even though he was compliant.   1. End-stage renal disease. Status post [facility] 07/08/2019 and first HD. 3rd HD 9/15, now on TTS.  Discussed permanent access again with him today-->he is finally willing to get perm access. After d/w CIR MD we will hold off till he's d/c to facilitate rehab.  Next HD Today; he did cramp the end of last treatment so we may be getting close.  2. Hypertension. Should improve with ultrafiltration. On home dose of amlodipine.  3. Acute on chronic diastolic congestive heart failure. Improving with dialysis.  4. Malignant hypertension. Will monitor blood pressure with dialysis. Doubt intervention on any significant renal artery stenosis would make a difference in his renal function.   5. Diabetes mellitus. Overall poor control with a hemoglobin A1c of 12.4.  6. Volume overload. Should improve  with ultrafiltration on dialysis.  7. Anemia. Is iron replete. Hgb 10.1, will follow and dose Aranesp as appropriate- Aranesp 60 qweek  8. BMD. Cont phosphate binders -> incr to renvela 2tabs TIDM on 9/23. Will recheck level in a few days.  9. To CIR, CLIP  Subjective:   Swelling much better as is the dyspnea and tolerating CIR. He did cramp end of last treatment.   Objective:   BP (!) 159/90 (BP Location: Right Arm)   Pulse 97   Temp 98.4 F (36.9 C) (Oral)   Resp 18   Ht 5\' 7"  (1.702 m)   Wt 133.6 kg   SpO2 99%   BMI 46.13 kg/m   Intake/Output Summary (Last 24 hours) at 07/21/2019 1425 Last data filed at 07/21/2019 1314 Gross per 24 hour  Intake 480 ml  Output 1475 ml  Net -995 ml   Weight change: -0.8 kg  Physical Exam: General: AAOx3 NAD Neck: No JVD CV: Heart RRR  Lungs: clear anteriorly Abd: abd SNT/ND with normal BS Extremities: +1 bilateral lower extremity edema. ACCESS: R IJ TDC  Imaging: No results found.  Labs: BMET Recent Labs  Lab 07/15/19 0201 07/15/19 0555 07/17/19 0649 07/19/19 1410  NA 129* 134* 133* 131*  K 4.4 4.0 4.5 4.2  CL 90* 97* 93* 90*  CO2 22 23 24 25   GLUCOSE 196* 144* 140* 165*  BUN 106* 46* 87* 69*  CREATININE 8.17* 4.61* 7.16* 6.86*  CALCIUM 9.1 8.7* 9.1 8.9  PHOS 9.2* 4.7* 7.7* 5.9*   CBC Recent Labs  Lab 07/15/19 0200 07/16/19 1640 07/19/19 1410  WBC 9.6 8.6 11.6*  NEUTROABS 5.0  --   --  HGB 10.7* 10.1* 9.9*  HCT 33.4* 32.5* 31.0*  MCV 77.0* 78.5* 78.1*  PLT 500* 407* 373    Medications:    . allopurinol  200 mg Oral Daily  . amLODipine  10 mg Oral Daily  . aspirin EC  81 mg Oral Daily  . atorvastatin  80 mg Oral Daily  . carvedilol  25 mg Oral BID WC  . Chlorhexidine Gluconate Cloth  6 each Topical Q0600  . cloNIDine  0.3 mg Oral TID  . darbepoetin (ARANESP) injection - DIALYSIS  60 mcg Intravenous Q Thu-HD  . diclofenac sodium  2 g Topical QID  . ezetimibe  10 mg Oral QPM  .  fluticasone  1 spray Each Nare Daily  . furosemide  20 mg Oral Daily  . gabapentin  100 mg Oral TID  . heparin  5,000 Units Subcutaneous Q8H  . insulin aspart  0-20 Units Subcutaneous TID WC  . insulin aspart  0-5 Units Subcutaneous QHS  . insulin detemir  39 Units Subcutaneous BID  . linaclotide  145 mcg Oral QAC breakfast  . sevelamer carbonate  1,600 mg Oral TID WC      Otelia Santee, MD 07/21/2019, 2:25 PM

## 2019-07-21 NOTE — Progress Notes (Signed)
Occupational Therapy Session Note  Patient Details  Name: Kelly Key MRN: [id number] Date of Birth: 10/14/1971  Today's Date: 07/21/2019 OT Individual Time: [id number] OT Individual Time Calculation (min): 56 min    Short Term Goals: Week 1:  OT Short Term Goal 1 (Week 1): Pt will don pants with min A OT Short Term Goal 2 (Week 1): Pt will complete sit <> stand with min A OT Short Term Goal 3 (Week 1): Pt will transfer to [facility] with min A OT Short Term Goal 4 (Week 1): Pt will demonstrate increased endurance and stand for 1 grooming activity with min A  Skilled Therapeutic Interventions/Progress Updates:    Pt completed bathing and dressing during session.  He completed mod assist transfer stand pivot to the shower from the wheelchair with use of the grab bars.  He washed only his LB and legs in sitting secondary to having a porta cath.  He also utilized a LH sponge for washing his lower legs and feet.  Lateral leans for washing buttocks but not thoroughly secondary to not standing.  UB dressing with supervision at the sink with LB dressing at min assist sit to stand to donn his underpants and pants.  Mod assist for donning gripper socks secondary to flexibility issues.  Finished session with pt in the wheelchair with call button and phone in reach and safety alarm pad in place.    Therapy Documentation Precautions:  Precautions Precautions: Fall Restrictions Weight Bearing Restrictions: No   Pain: Pain Assessment Pain Scale: Faces Faces Pain Scale: Hurts a little bit Pain Type: Acute pain Pain Location: Wrist Pain Orientation: Right Pain Descriptors / Indicators: Discomfort Pain Onset: With Activity Pain Intervention(s): Repositioned Multiple Pain Sites: No ADL: See Care Tool Section for some details of ADLs  Therapy/Group: Individual Therapy  Amaya Blakeman OTR/L 07/21/2019, 9:40 AM

## 2019-07-21 NOTE — Progress Notes (Signed)
Occupational Therapy Session Note  Patient Details  Name: Kelly Key MRN: [id number] Date of Birth: 10-Aug-1971  Today's Date: 07/21/2019 OT Individual Time: 0700-0730 and [id number] - 1146 OT Individual Time Calculation (min): 30 min  and 14 mins Today's Date: 07/21/2019 OT Missed Time: 15 Minutes Missed Time Reason: Patient ill (comment)(hypoglycemic event)   Short Term Goals: Week 1:  OT Short Term Goal 1 (Week 1): Pt will don pants with min A OT Short Term Goal 2 (Week 1): Pt will complete sit <> stand with min A OT Short Term Goal 3 (Week 1): Pt will transfer to [facility] with min A OT Short Term Goal 4 (Week 1): Pt will demonstrate increased endurance and stand for 1 grooming activity with min A  Skilled Therapeutic Interventions/Progress Updates:   Session 1: Upon entering the room, pt supine in bed and reports feeling "weak" this morning. Supine >sit with supervision. Pt standing with min A and use of RW for 2 minutes with BP of 155/85. Pt taking seated rest break and then performed stand pivot transfer from bed >wheelchair with min A and use of RW. Pt propelled wheelchair to sink for UB self care with supervision/set up. Pt left at sink to finishing grooming tasks prior to next session. Pt able to position wheelchair when done. Call bell and all needed items within reach.   Session 2:  Pt supine in bed and body is shaking. OT asking if pt felt unwell or may have a temperature. Pt verbalized "no, I'm just cold." NT arrived to check blood glucose with result of 70. Pt given cranberry juice and repositioned in bed for comfort. Call bell and all needed items within reach.  Bed alarm activated. 15 missed minutes secondary to hypoglycemic event.   Therapy Documentation Precautions:  Precautions Precautions: Fall Restrictions Weight Bearing Restrictions: No General: General OT Amount of Missed Time: 15 Minutes Vital Signs: Therapy Vitals Pulse Rate: 93 Resp: 18 BP: (!) 171/94 Patient  Position (if appropriate): Lying Oxygen Therapy SpO2: 100 % O2 Device: Room Air   Therapy/Group: Individual Therapy  Gypsy Decant 07/21/2019, 12:22 PM

## 2019-07-22 ENCOUNTER — Inpatient Hospital Stay (HOSPITAL_COMMUNITY): Payer: Medicare Other | Admitting: Physical Therapy

## 2019-07-22 ENCOUNTER — Inpatient Hospital Stay (HOSPITAL_COMMUNITY): Payer: Medicare Other | Admitting: Occupational Therapy

## 2019-07-22 ENCOUNTER — Inpatient Hospital Stay (HOSPITAL_COMMUNITY): Payer: Medicare Other

## 2019-07-22 LAB — GLUCOSE, CAPILLARY
Glucose-Capillary: 119 mg/dL — ABNORMAL HIGH (ref 70–99)
Glucose-Capillary: 210 mg/dL — ABNORMAL HIGH (ref 70–99)
Glucose-Capillary: 184 mg/dL — ABNORMAL HIGH (ref 70–99)
Glucose-Capillary: 186 mg/dL — ABNORMAL HIGH (ref 70–99)
Glucose-Capillary: 236 mg/dL — ABNORMAL HIGH (ref 70–99)

## 2019-07-22 MED ORDER — CLONIDINE HCL 0.2 MG PO TABS
0.2000 mg | ORAL_TABLET | Freq: Two times a day (BID) | ORAL | Status: DC
  Administered 2019-07-23: 0.2 mg via ORAL
  Filled 2019-07-22 (×2): qty 1

## 2019-07-22 MED ORDER — CARVEDILOL 12.5 MG PO TABS
12.5000 mg | ORAL_TABLET | Freq: Two times a day (BID) | ORAL | Status: DC
  Administered 2019-07-22 – 2019-07-25 (×4): 12.5 mg via ORAL
  Filled 2019-07-22 (×6): qty 1

## 2019-07-22 MED ORDER — SORBITOL 70 % SOLN
45.0000 mL | Freq: Every day | Status: DC | PRN
  Administered 2019-07-22: 45 mL via ORAL
  Filled 2019-07-22: qty 60

## 2019-07-22 MED ORDER — AMLODIPINE BESYLATE 5 MG PO TABS
5.0000 mg | ORAL_TABLET | Freq: Every day | ORAL | Status: DC
  Administered 2019-07-23 – 2019-07-29 (×7): 5 mg via ORAL
  Filled 2019-07-22 (×7): qty 1

## 2019-07-22 NOTE — Progress Notes (Signed)
[place] PHYSICAL MEDICINE & REHABILITATION PROGRESS NOTE  Subjective/Complaints: Patient seen sitting up in his chair this morning.  He states he did not sleep well overnight because he had cramping and stiffness after returning from dialysis.  He was seen by nephrology yesterday, notes reviewed.  ROS: Denies CP, shortness of breath, nausea, vomiting, diarrhea.  Objective: Vital Signs: Blood pressure (!) 142/85, pulse 98, temperature 97.6 F (36.4 C), temperature source Oral, resp. rate 16, height 5\' 7"  (1.702 m), weight 133.6 kg, SpO2 98 %. No results found. Recent Labs    07/19/19 1410 07/21/19 1635  WBC 11.6* 11.7*  HGB 9.9* 9.5*  HCT 31.0* 29.6*  PLT 373 335   Recent Labs    07/19/19 1410 07/21/19 1636  NA 131* 130*  K 4.2 4.2  CL 90* 92*  CO2 25 23  GLUCOSE 165* 151*  BUN 69* 67*  CREATININE 6.86* 6.97*  CALCIUM 8.9 8.6*    Physical Exam: BP (!) 142/85 (BP Location: Right Arm)   Pulse 98   Temp 97.6 F (36.4 C) (Oral)   Resp 16   Ht 5\' 7"  (1.702 m)   Wt 133.6 kg   SpO2 98%   BMI 46.13 kg/m  Constitutional: No distress . Vital signs reviewed.  Morbidly obese. HENT: Normocephalic.  Atraumatic. Eyes: EOMI. No discharge. Cardiovascular: No JVD. Respiratory: Normal effort.  No stridor. GI: Non-distended. Skin: Warm and dry.  Intact. Psych: Flat. Musc: Right wrist with minimal radial tenderness Neurological: Alert. Bilateral upper extremities: 4/5 proximal to distal, stable Bilateral lower extremities: 4--4/5 proximal to distal, improving Skin: Vascular changes bilateral lower extremities   Assessment/Plan: 1. Functional deficits secondary to debility which require 3+ hours per day of interdisciplinary therapy in a comprehensive inpatient rehab setting.  Physiatrist is providing close team supervision and 24 hour management of active medical problems listed below.  Physiatrist and rehab team continue to assess barriers to discharge/monitor patient  progress toward functional and medical goals  Care Tool:  Bathing  Bathing activity did not occur: Safety/medical concerns Body parts bathed by patient: Right arm, Left arm, Chest, Abdomen   Body parts bathed by helper: Right arm, Left arm, Face(Did not attempt other parts) Body parts n/a: Buttocks, Right arm, Left arm, Chest, Abdomen   Bathing assist Assist Level: Supervision/Verbal cueing     Upper Body Dressing/Undressing Upper body dressing   What is the patient wearing?: Pull over shirt    Upper body assist Assist Level: Supervision/Verbal cueing    Lower Body Dressing/Undressing Lower body dressing    Lower body dressing activity did not occur: N/A What is the patient wearing?: Pants, Underwear/pull up     Lower body assist Assist for lower body dressing: Minimal Assistance - Patient > 75%     Toileting Toileting Toileting Activity did not occur (Clothing management and hygiene only): N/A (no void or bm)  Toileting assist Assist for toileting: Moderate Assistance - Patient 50 - 74%     Transfers Chair/bed transfer  Transfers assist     Chair/bed transfer assist level: Minimal Assistance - Patient > 75%     Locomotion Ambulation   Ambulation assist      Assist level: Moderate Assistance - Patient 50 - 74% Assistive device: Walker-rolling Max distance: 15   Walk 10 feet activity   Assist  Walk 10 feet activity did not occur: Safety/medical concerns  Assist level: Moderate Assistance - Patient - 50 - 74% Assistive device: Walker-rolling   Walk 50 feet activity  Assist Walk 50 feet with 2 turns activity did not occur: Safety/medical concerns         Walk 150 feet activity   Assist Walk 150 feet activity did not occur: Safety/medical concerns         Walk 10 feet on uneven surface  activity   Assist Walk 10 feet on uneven surfaces activity did not occur: Safety/medical concerns         Wheelchair     Assist Will  patient use wheelchair at discharge?: No             Wheelchair 50 feet with 2 turns activity    Assist            Wheelchair 150 feet activity     Assist            Medical Problem List and Plan: 1.  Generalized weakness with limitations in self-care secondary to debility  Cont CIR 2.  Antithrombotics: -DVT/anticoagulation:  Pharmaceutical: Heparin             -antiplatelet therapy: On ASA 81 mg daily 3. Pain Management: On gabapentin 3 times daily for neuropathy with hydrocodone on PRN basis             Voltaren gel ordered for right wrist on 9/22, continues to improve  4. Mood: LCSW to follow for evaluation and support             -antipsychotic agents: N/A 5. Neuropsych: This patient is capable of making decisions on his own behalf. 6. Skin/Wound Care: Routine pressure relief measures 7. Fluids/Electrolytes/Nutrition: CM/renal diet with 1200 cc FR.  8.  T2DM with hyperglycemia: Hemoglobin A1c-12.4 and poorly controlled.  Continue Levemir twice daily and use SSI for tighter blood sugar control.  Monitor blood sugars AC/HS.   Levemir increased to 39 twice daily on 9/24  Labile on 9/25, monitor for trend             Monitor with increased mobility. 9.  Hypertension: Monitor BP TID--follow-up with [name] for work-up to rule out RAS. Continue amlodipine, Coreg twice daily, clonidine TID    Furosemide decreased to daily 20 on 9/22  Albumin infusion ordered on 9/23  Relatively controlled on 9/25 10.  Anemia of chronic disease: Received IV iron             Labs with HD  Hemoglobin 9.5 on 9/24 11.  ESRD/chronic combined CHF: Anasarca multifactorial and improving with hemodialysis.             Daily weights  Discussed with nephrology on 9/23, plan for fistula placement post discharge Filed Weights   07/20/19 0542 07/21/19 0541 07/21/19 1615  Weight: 135 kg 133.6 kg 133.6 kg   Stable on 9/25 12.  Right wrist pain-likely sprain/strain  X-ray personally  reviewed, showing edema, but otherwise unremarkable  Uric acid within normal limits  Continue Voltaren gel   Brace pending  Continues to improve 13.  Leukocytosis  WBCs 11.7 on 9/24, labs with HD  Continue to monitor  LOS: 6 days A FACE TO FACE EVALUATION WAS PERFORMED  Kelly Key Lorie Phenix 07/22/2019, 9:52 AM

## 2019-07-22 NOTE — Progress Notes (Signed)
Physical Therapy Session Note  Patient Details  Name: Kelly Key MRN: [id number] Date of Birth: 13-Feb-1971  Today's Date: 07/22/2019 PT Individual Time: [id number] PT Individual Time Calculation (min): 31 min   Short Term Goals: Week 1:  PT Short Term Goal 1 (Week 1): Pt will perform sit<>stand with min assist and LRAD PT Short Term Goal 2 (Week 1): Pt will ambulate  25 ft with min assist and LRAD PT Short Term Goal 3 (Week 1): Pt will perform bed<>chair transfer with min assist and LRAD  Skilled Therapeutic Interventions/Progress Updates:  Pt missed time at beginning of the session 2/2 RN administering meds. Pt received in w/c, making minimal eye contact with therapist during session. Pt reports 10/10 pain in B knees but states he received pain meds. Pt agreeable to exercises but declines attempting to propel w/c room>dayroom despite encouragement. Transported pt to/from dayroom via w/c dependent assist. Pt transfers sit>stand with RW & max assist with extra time & mod assist for stand pivot w/c>nu-step with poor demo of safe, proper hand placement for stand>sit transfers. Pt utilized nu-step on level 2 x 10 minutes with encouragement with all four extremities with task focusing on global strengthening & endurance training. Utilized music to increase pt's mood during session. Pt required rest break before attempting to transfer back to w/c. Pt requests to transfer back to w/c with mod assist squat pivot with cuing for hand placement. At end of session pt left in w/c with chair alarm donned, all needs in reach.  Pt reports significant fatigue following dialysis yesterday.  Therapy Documentation Precautions:  Precautions Precautions: Fall Restrictions Weight Bearing Restrictions: No   General: PT Amount of Missed Time (min): 29 Minutes PT Missed Treatment Reason: (receiving meds, unwilling)    Therapy/Group: Individual Therapy  Waunita Schooner 07/22/2019, 10:00 AM

## 2019-07-22 NOTE — Progress Notes (Signed)
Occupational Therapy Weekly Progress Note  Patient Details  Name: Kelly Key MRN: [id number] Date of Birth: 19-Mar-1971  Beginning of progress report period: July 17, 2019 End of progress report period: July 22, 2019  Today's Date: 07/22/2019 OT Individual Time: 1025-8527 OT Individual Time Calculation (min): 69 min    Patient has met 0 of 4 short term goals.  Kelly Key continues to demonstrate fluctuating progress secondary to medical issues.  He has been able to complete UB selfcare with supervision and LB selfcare sit to stand with min assist on occasion but this is not consistent.  Depending on his knee pain level as well as right wrist pain, he can sometimes still need mod assist for sit to stand and for stand pivot transfers from one surface to the other.  In addition, hypotension has been an issue the day after his dialysis, resulting in him not feeling well and only being able to participate in limited therapy sessions.  Feel he is on target and can reach supervision level if therapy remains consistent and his body can adjust to his dialysis schedule.  Recommend continued OT at CIR level with expected discharge 10/7.    Patient continues to demonstrate the following deficits: muscle weakness and decreased standing balance and decreased balance strategies and therefore will continue to benefit from skilled OT intervention to enhance overall performance with BADL and Reduce care partner burden.  Patient progressing toward long term goals..  Continue plan of care.  OT Short Term Goals Week 2:  OT Short Term Goal 1 (Week 2): Pt will transfer to [facility] with min A OT Short Term Goal 2 (Week 2): Pt will complete sit <> stand with min A OT Short Term Goal 3 (Week 2): Pt will don pants with min A OT Short Term Goal 4 (Week 2): Pt will demonstrate increased endurance and stand for 1 grooming activity with min A  Skilled Therapeutic Interventions/Progress Updates:    Pt's BP taken in  supine to start session at 121/76.  He reported feeling dizzy slightly but just not feeling great overall.  He completed supine to sit with supervision where BP was taken again at 117/75.  Mod assist for stand pivot transfer to the wheelchair.  Therapist next took pt down to the therapy gym where he worked on [employer] and endurance with use of the UE Ergonometer.  He completed 15 mins total separated into 4 set of 4 mins, 3, mins, 3 mins, and 4 mins.  He needed multiple rest breaks for 2-3 mins each secondary to fatigue.  RPMs maintained at 18-25 with each set, but pt with variable speed and difficulty keeping it consistent.  He reported arm fatigue but no substantial pain in the right arm or wrist.  Finished session with transfer to back to the room and pt completing stand pivot transfer back to the bed with mod assist.  Call button and phone in reach with bed alarm in place.   Therapy Documentation Precautions:  Precautions Precautions: Fall Restrictions Weight Bearing Restrictions: No   Vital Signs: Therapy Vitals Temp: 97.9 F (36.6 C) Temp Source: Oral Pulse Rate: 67 Resp: 18 BP: 127/74 Patient Position (if appropriate): Lying(HOB elevated 58 degrees) Oxygen Therapy SpO2: 97 % O2 Device: Room Air Pain: Pain Assessment Pain Scale: Faces Faces Pain Scale: Hurts a little bit Pain Type: Acute pain Pain Location: Knee Pain Orientation: Right;Left Pain Descriptors / Indicators: Discomfort Pain Onset: With Activity Pain Intervention(s): Repositioned ADL: See Care Tool  section for some details of mobility and selfcare.  Therapy/Group: Individual Therapy  Kelly Key OTR/L 07/22/2019, 3:57 PM

## 2019-07-22 NOTE — Progress Notes (Signed)
Kelly Key Progress Note    Assessment/ Plan:   48 y.o.malewitha PMH significant for dCHF, DM, HTN, HLD, morbid obesity, fatty liver disease, and CKD stage 4 followed by Dr. Lowanda Foster with nephrotic range proteinuria (documented in past as >11 grams/24 hours) who was recently hospitalized on 06/22/2019 [facility] with a 2 week history of worsening lower extremity edema and abdominal girth not responsive to increasing doses of torsemide as an outpatient. He has had accelerated HTN and workup for renal artery stenosis was underway with [name] however that has not been scheduled.During that hospitalization there were extensive discussions with the pt educating him about the need to initiate dialysis but he refused wanting to give a trial with outpt diuretics.   1. End-stage renal disease. Status post [facility] 07/08/2019 and first HD. 3rd HD 9/15, now on TTS.  Discussed permanent access again with him -->heis finally willing to get perm access. After d/w CIR MD we will hold off till he's d/c to facilitate rehab.  Next HD Saturday; he did cramp the end of last treatment so we may be getting close. Will adjust BP meds and maybe that will allow [place] to improve the edema.  2. Hypertension. Should improve with ultrafiltration. On home dose of amlodipine. - had a hypotensive episode today and BP's trending down likely reflecting removal of all the excessive fluid. - Decr labetolol to 12.5 bid - decr clonidine from 0.3 tid to 0.2 BID (goal is to titrate off the clonidine)  3. Acute on chronic diastolic congestive heart failure. Improving with dialysis.  4. Malignant hypertension. Will monitor blood pressure with dialysis. Doubt intervention on any significant renal artery stenosis would make a difference in his renal function.   5. Diabetes mellitus. Overall poor control with a hemoglobin A1c of 12.4.  6. Volume overload. Should improve with ultrafiltration on  dialysis.  7. Anemia. Is iron replete. Hgb 10.1, will follow and dose Aranesp as appropriate-Aranesp 60 qweek  8. BMD. Cont phosphate binders-> incr to renvela 2tabs TIDM on 9/23. Will recheck level in a few days.  9. To CIR, CLIP  Subjective:   Dizzy today. Denies f/c/n/v/dyspnea   Objective:   BP 127/74   Pulse 67   Temp 97.9 F (36.6 C) (Oral)   Resp 18   Ht 5\' 7"  (1.702 m)   Wt 133.6 kg   SpO2 97%   BMI 46.13 kg/m   Intake/Output Summary (Last 24 hours) at 07/22/2019 1544 Last data filed at 07/22/2019 0730 Gross per 24 hour  Intake 240 ml  Output 2000 ml  Net -1760 ml   Weight change: 0 kg  Physical Exam: General: AAOx3 NAD Neck: No JVD CV: Heart RRR  Lungs: clear anteriorly Abd: abd SNT/ND with normal BS Extremities: +1 bilateral lower extremity edema. ACCESS: R IJ TDC  Imaging: No results found.  Labs: BMET Recent Labs  Lab 07/17/19 0649 07/19/19 1410 07/21/19 1636  NA 133* 131* 130*  K 4.5 4.2 4.2  CL 93* 90* 92*  CO2 24 25 23   GLUCOSE 140* 165* 151*  BUN 87* 69* 67*  CREATININE 7.16* 6.86* 6.97*  CALCIUM 9.1 8.9 8.6*  PHOS 7.7* 5.9* 5.7*   CBC Recent Labs  Lab 07/16/19 1640 07/19/19 1410 07/21/19 1635  WBC 8.6 11.6* 11.7*  HGB 10.1* 9.9* 9.5*  HCT 32.5* 31.0* 29.6*  MCV 78.5* 78.1* 78.3*  PLT 407* 373 335    Medications:    . allopurinol  200 mg Oral Daily  . [  START ON 07/23/2019] amLODipine  5 mg Oral QHS  . aspirin EC  81 mg Oral Daily  . atorvastatin  80 mg Oral Daily  . carvedilol  12.5 mg Oral BID WC  . Chlorhexidine Gluconate Cloth  6 each Topical Q0600  . [START ON 07/23/2019] cloNIDine  0.2 mg Oral BID  . darbepoetin (ARANESP) injection - DIALYSIS  60 mcg Intravenous Q Thu-HD  . diclofenac sodium  2 g Topical QID  . ezetimibe  10 mg Oral QPM  . fluticasone  1 spray Each Nare Daily  . furosemide  20 mg Oral Daily  . gabapentin  100 mg Oral TID  . heparin  5,000 Units Subcutaneous Q8H  . insulin aspart   0-20 Units Subcutaneous TID WC  . insulin aspart  0-5 Units Subcutaneous QHS  . insulin detemir  39 Units Subcutaneous BID  . linaclotide  145 mcg Oral QAC breakfast  . sevelamer carbonate  1,600 mg Oral TID WC      Kelly Santee, MD 07/22/2019, 3:44 PM

## 2019-07-22 NOTE — Progress Notes (Signed)
Occupational Therapy Session Note  Patient Details  Name: ABISHAI SUNDERHAUS MRN: [id number] Date of Birth: 1970-12-29  Today's Date: 07/22/2019 OT Individual Time: 0700-0757 OT Individual Time Calculation (min): 57 min    Short Term Goals: Week 1:  OT Short Term Goal 1 (Week 1): Pt will don pants with min A OT Short Term Goal 2 (Week 1): Pt will complete sit <> stand with min A OT Short Term Goal 3 (Week 1): Pt will transfer to [facility] with min A OT Short Term Goal 4 (Week 1): Pt will demonstrate increased endurance and stand for 1 grooming activity with min A  Skilled Therapeutic Interventions/Progress Updates:    1:1. Pt received in bed with pain reporting in B knees increasing in standing. Pt supine>sitting EOB with S overall. MIN A provided for RW transfers to w/c with VC for upright posture. Pt completes grooming and UB dressing seated in w/c with set up. Pt requesting to work BLEs and knees. Pt completes 4x1 min work on [employer] velocity 35 cm/sec for BLE strengthening and reciprocal movement training required for functional mobility and uright posture on RW. Pt completes 1x10 BLE therex with 3# ankle weights and green theraband seated in w/c for BLE strengthening required for functional mobility: LAQ, marches, knee flexion and hip ab/adduct. Exited session with pt seated in wc, call light in reach and exit alarm on  Therapy Documentation Precautions:  Precautions Precautions: Fall Restrictions Weight Bearing Restrictions: No General:   Vital Signs:  Pain:   ADL:   Vision   Perception    Praxis   Exercises:   Other Treatments:     Therapy/Group: Individual Therapy  Tonny Branch 07/22/2019, 7:22 AM

## 2019-07-23 ENCOUNTER — Inpatient Hospital Stay (HOSPITAL_COMMUNITY): Payer: Medicare Other | Admitting: Physical Therapy

## 2019-07-23 LAB — RENAL FUNCTION PANEL
Albumin: 2.3 g/dL — ABNORMAL LOW (ref 3.5–5.0)
Anion gap: 16 — ABNORMAL HIGH (ref 5–15)
BUN: 58 mg/dL — ABNORMAL HIGH (ref 6–20)
CO2: 22 mmol/L (ref 22–32)
Calcium: 8.6 mg/dL — ABNORMAL LOW (ref 8.9–10.3)
Chloride: 91 mmol/L — ABNORMAL LOW (ref 98–111)
Creatinine, Ser: 6.71 mg/dL — ABNORMAL HIGH (ref 0.61–1.24)
GFR calc Af Amer: 10 mL/min — ABNORMAL LOW (ref >60)
GFR calc non Af Amer: 9 mL/min — ABNORMAL LOW (ref >60)
Glucose, Bld: 179 mg/dL — ABNORMAL HIGH (ref 70–99)
Phosphorus: 6.1 mg/dL — ABNORMAL HIGH (ref 2.5–4.6)
Potassium: 3.6 mmol/L (ref 3.5–5.1)
Sodium: 129 mmol/L — ABNORMAL LOW (ref 135–145)

## 2019-07-23 LAB — CBC
HCT: 29.2 % — ABNORMAL LOW (ref 39.0–52.0)
Hemoglobin: 9.5 g/dL — ABNORMAL LOW (ref 13.0–17.0)
MCH: 25.7 pg — ABNORMAL LOW (ref 26.0–34.0)
MCHC: 32.5 g/dL (ref 30.0–36.0)
MCV: 78.9 fL — ABNORMAL LOW (ref 80.0–100.0)
Platelets: 327 10*3/uL (ref 150–400)
RBC: 3.7 MIL/uL — ABNORMAL LOW (ref 4.22–5.81)
RDW: 15.7 % — ABNORMAL HIGH (ref 11.5–15.5)
WBC: 10.4 10*3/uL (ref 4.0–10.5)
nRBC: 0 % (ref 0.0–0.2)

## 2019-07-23 LAB — GLUCOSE, CAPILLARY
Glucose-Capillary: 88 mg/dL (ref 70–99)
Glucose-Capillary: 124 mg/dL — ABNORMAL HIGH (ref 70–99)
Glucose-Capillary: 85 mg/dL (ref 70–99)
Glucose-Capillary: 227 mg/dL — ABNORMAL HIGH (ref 70–99)

## 2019-07-23 MED ORDER — HEPARIN SODIUM (PORCINE) 1000 UNIT/ML IJ SOLN
INTRAMUSCULAR | Status: AC
  Administered 2019-07-23: 1000 [IU]
  Filled 2019-07-23: qty 4

## 2019-07-23 MED ORDER — INSULIN ASPART 100 UNIT/ML ~~LOC~~ SOLN
3.0000 [IU] | Freq: Three times a day (TID) | SUBCUTANEOUS | Status: DC
  Administered 2019-07-23 – 2019-08-01 (×23): 3 [IU] via SUBCUTANEOUS

## 2019-07-23 NOTE — Progress Notes (Signed)
Physical Therapy Session Note  Patient Details  Name: Kelly Key MRN: [id number] Date of Birth: September 18, 1971  Today's Date: 07/23/2019 PT Individual Time: 0900-0959 PT Individual Time Calculation (min): 59 min   Short Term Goals: Week 1:  PT Short Term Goal 1 (Week 1): Pt will perform sit<>stand with min assist and LRAD PT Short Term Goal 2 (Week 1): Pt will ambulate  25 ft with min assist and LRAD PT Short Term Goal 3 (Week 1): Pt will perform bed<>chair transfer with min assist and LRAD  Skilled Therapeutic Interventions/Progress Updates:  Pt was seen bedside in the am. Pt requested to take shower. Pt transferred supine to edge of bed with S and side rail. Pt transferred edge of bed to w/c with min A and verbal cues. Pt shaved and washed upper body with S. Placed protective patch over the catheter. Pt transported into the bathroom. Pt transferred w/c to shower chair with grab bar and min A with verbal cues. Pt required min A to doff pants and underwear. Pt able to shower and clean LE and lower body with S. Pt transferred shower chair to w/c with min A and verbal cues. Pt transferred w/c to edge of bed with min A. Pt able to don pants and underwear with S except min A to stand to pull up. Pt dependent to don socks. Pt transferred edge of bed to w/c with min A and verbal cues. Pt left sitting up in w/c with call bell within reach and bed alarm on.   Therapy Documentation Precautions:  Precautions Precautions: Fall Restrictions Weight Bearing Restrictions: No Vital Signs: BP 137/89, HR 86 Pain: Pain Assessment Pain Scale: 0-10 Pain Score: 0-No pain   Therapy/Group: Individual Therapy  Dub Amis 07/23/2019, 12:12 PM

## 2019-07-23 NOTE — Progress Notes (Signed)
[facility] PHYSICAL MEDICINE & REHABILITATION PROGRESS NOTE  Subjective/Complaints: Had a better night. Slept. Already ate breakfast. Denies pain  ROS: Patient denies fever, rash, sore throat, blurred vision, nausea, vomiting, diarrhea, cough, shortness of breath or chest pain, joint or back pain, headache, or mood change.    Objective: Vital Signs: Blood pressure (!) 159/97, pulse 78, temperature 97.7 F (36.5 C), temperature source Oral, resp. rate 18, height 5\' 7"  (1.702 m), weight 134 kg, SpO2 100 %. No results found. Recent Labs    07/21/19 1635  WBC 11.7*  HGB 9.5*  HCT 29.6*  PLT 335   Recent Labs    07/21/19 1636  NA 130*  K 4.2  CL 92*  CO2 23  GLUCOSE 151*  BUN 67*  CREATININE 6.97*  CALCIUM 8.6*    Physical Exam: BP (!) 159/97 (BP Location: Left Arm)   Pulse 78   Temp 97.7 F (36.5 C) (Oral)   Resp 18   Ht 5\' 7"  (1.702 m)   Wt 134 kg   SpO2 100%   BMI 46.27 kg/m  Constitutional: No distress . Vital signs reviewed. obese HEENT: EOMI, oral membranes moist Neck: supple Cardiovascular: RRR without murmur. No JVD    Respiratory: CTA Bilaterally without wheezes or rales. Normal effort    GI: BS +, non-tender, non-distended  Skin: Warm and dry.  Intact. Psych: flat but cooperative Musc: Right wrist with minimal radial tenderness today Neurological: Alert. Bilateral upper extremities: 4/5 proximal to distal, stable Bilateral lower extremities: 4--4/5 proximal to distal, stable Skin: Vascular changes bilateral lower extremities   Assessment/Plan: 1. Functional deficits secondary to debility which require 3+ hours per day of interdisciplinary therapy in a comprehensive inpatient rehab setting.  Physiatrist is providing close team supervision and 24 hour management of active medical problems listed below.  Physiatrist and rehab team continue to assess barriers to discharge/monitor patient progress toward functional and medical goals  Care  Tool:  Bathing  Bathing activity did not occur: Safety/medical concerns Body parts bathed by patient: Right arm, Left arm, Chest, Abdomen   Body parts bathed by helper: Right arm, Left arm, Face(Did not attempt other parts) Body parts n/a: Buttocks, Right arm, Left arm, Chest, Abdomen   Bathing assist Assist Level: Supervision/Verbal cueing     Upper Body Dressing/Undressing Upper body dressing   What is the patient wearing?: Pull over shirt    Upper body assist Assist Level: Supervision/Verbal cueing    Lower Body Dressing/Undressing Lower body dressing    Lower body dressing activity did not occur: N/A What is the patient wearing?: Pants, Underwear/pull up     Lower body assist Assist for lower body dressing: Minimal Assistance - Patient > 75%     Toileting Toileting Toileting Activity did not occur (Clothing management and hygiene only): N/A (no void or bm)  Toileting assist Assist for toileting: Moderate Assistance - Patient 50 - 74%     Transfers Chair/bed transfer  Transfers assist     Chair/bed transfer assist level: Moderate Assistance - Patient 50 - 74%     Locomotion Ambulation   Ambulation assist      Assist level: Moderate Assistance - Patient 50 - 74% Assistive device: Walker-rolling Max distance: 15   Walk 10 feet activity   Assist  Walk 10 feet activity did not occur: Safety/medical concerns  Assist level: Moderate Assistance - Patient - 50 - 74% Assistive device: Walker-rolling   Walk 50 feet activity   Assist Walk 50 feet with  2 turns activity did not occur: Safety/medical concerns         Walk 150 feet activity   Assist Walk 150 feet activity did not occur: Safety/medical concerns         Walk 10 feet on uneven surface  activity   Assist Walk 10 feet on uneven surfaces activity did not occur: Safety/medical concerns         Wheelchair     Assist Will patient use wheelchair at discharge?: No              Wheelchair 50 feet with 2 turns activity    Assist            Wheelchair 150 feet activity     Assist            Medical Problem List and Plan: 1.  Generalized weakness with limitations in self-care secondary to debility  Cont CIR 2.  Antithrombotics: -DVT/anticoagulation:  Pharmaceutical: Heparin             -antiplatelet therapy: On ASA 81 mg daily 3. Pain Management: On gabapentin 3 times daily for neuropathy with hydrocodone on PRN basis             Voltaren gel ordered for right wrist on 9/22--improved 4. Mood: LCSW to follow for evaluation and support             -antipsychotic agents: N/A 5. Neuropsych: This patient is capable of making decisions on his own behalf. 6. Skin/Wound Care: Routine pressure relief measures 7. Fluids/Electrolytes/Nutrition: CM/renal diet with 1200 cc FR.  8.  T2DM with hyperglycemia: Hemoglobin A1c-12.4 and poorly controlled.  Continue Levemir twice daily and use SSI for tighter blood sugar control.  Monitor blood sugars AC/HS.   Levemir increased to 39 twice daily on 9/24  -add meal time scheduled covg 3u tid  Labile on 9/26              . 9.  Hypertension: Monitor BP TID--follow-up with [name] for work-up to rule out RAS. Continue amlodipine, Coreg twice daily, clonidine TID    Furosemide decreased to daily 20 on 9/22  Albumin infusion ordered on 9/23  Inconsistent control on 9/26 10.  Anemia of chronic disease: Received IV iron             Labs with HD  Hemoglobin 9.5 on 9/24 11.  ESRD/chronic combined CHF: Anasarca multifactorial and improving with hemodialysis.             Daily weights  Discussed with nephrology on 9/23, plan for fistula placement post discharge Filed Weights   07/21/19 0541 07/21/19 1615 07/23/19 0451  Weight: 133.6 kg 133.6 kg 134 kg   Stable on 9/25 12.  Right wrist pain-likely sprain/strain  X-ray personally reviewed, showing edema, but otherwise unremarkable  Uric acid within normal  limits  Continue Voltaren gel   Brace    Continues to improve 13.  Leukocytosis  WBCs 11.7 on 9/24, labs with HD  Continue to monitor  LOS: 7 days A FACE TO [place] 07/23/2019, 10:53 AM

## 2019-07-24 LAB — GLUCOSE, CAPILLARY
Glucose-Capillary: 124 mg/dL — ABNORMAL HIGH (ref 70–99)
Glucose-Capillary: 127 mg/dL — ABNORMAL HIGH (ref 70–99)
Glucose-Capillary: 89 mg/dL (ref 70–99)
Glucose-Capillary: 174 mg/dL — ABNORMAL HIGH (ref 70–99)

## 2019-07-24 MED ORDER — CLONIDINE HCL 0.1 MG PO TABS
0.1000 mg | ORAL_TABLET | Freq: Two times a day (BID) | ORAL | Status: DC
  Administered 2019-07-24 – 2019-07-27 (×5): 0.1 mg via ORAL
  Filled 2019-07-24 (×6): qty 1

## 2019-07-24 NOTE — Progress Notes (Addendum)
[facility] PHYSICAL MEDICINE & REHABILITATION PROGRESS NOTE  Subjective/Complaints: Having a lot of right knee pain. Started bothering him last night. Associated swelling and decr ROM. Denies fall/injury  ROS: Patient denies fever, rash, sore throat, blurred vision, nausea, vomiting, diarrhea, cough, shortness of breath or chest pain,   headache, or mood change.   Objective: Vital Signs: Blood pressure 117/74, pulse 93, temperature 98.2 F (36.8 C), resp. rate 16, height 5\' 7"  (1.702 m), weight 132.5 kg, SpO2 98 %. No results found. Recent Labs    07/21/19 1635 07/23/19 1523  WBC 11.7* 10.4  HGB 9.5* 9.5*  HCT 29.6* 29.2*  PLT 335 327   Recent Labs    07/21/19 1636 07/23/19 1523  NA 130* 129*  K 4.2 3.6  CL 92* 91*  CO2 23 22  GLUCOSE 151* 179*  BUN 67* 58*  CREATININE 6.97* 6.71*  CALCIUM 8.6* 8.6*    Physical Exam: BP 117/74 (BP Location: Left Arm)   Pulse 93   Temp 98.2 F (36.8 C)   Resp 16   Ht 5\' 7"  (1.702 m)   Wt 132.5 kg   SpO2 98%   BMI 45.75 kg/m  Constitutional: No distress . Vital signs reviewed. HEENT: EOMI, oral membranes moist Neck: supple Cardiovascular: RRR without murmur. No JVD    Respiratory: CTA Bilaterally without wheezes or rales. Normal effort    GI: BS +, non-tender, non-distended   Skin: Warm and dry.  Intact. Psych: flat but cooperative Musc: Right wrist with minimal radial tenderness today Right knee tender, swollen, warm Neurological: Alert. Bilateral upper extremities: 4/5 proximal to distal,   Bilateral lower extremities: 4--4/5 proximal to distal,   Skin: Vascular changes bilateral lower extremities   Assessment/Plan: 1. Functional deficits secondary to debility which require 3+ hours per day of interdisciplinary therapy in a comprehensive inpatient rehab setting.  Physiatrist is providing close team supervision and 24 hour management of active medical problems listed below.  Physiatrist and rehab team continue to  assess barriers to discharge/monitor patient progress toward functional and medical goals  Care Tool:  Bathing  Bathing activity did not occur: Safety/medical concerns Body parts bathed by patient: Right arm, Left arm, Chest, Abdomen   Body parts bathed by helper: Right arm, Left arm, Face(Did not attempt other parts) Body parts n/a: Buttocks, Right arm, Left arm, Chest, Abdomen   Bathing assist Assist Level: Supervision/Verbal cueing     Upper Body Dressing/Undressing Upper body dressing   What is the patient wearing?: Pull over shirt    Upper body assist Assist Level: Supervision/Verbal cueing    Lower Body Dressing/Undressing Lower body dressing    Lower body dressing activity did not occur: N/A What is the patient wearing?: Pants, Underwear/pull up     Lower body assist Assist for lower body dressing: Minimal Assistance - Patient > 75%     Toileting Toileting Toileting Activity did not occur (Clothing management and hygiene only): N/A (no void or bm)  Toileting assist Assist for toileting: Moderate Assistance - Patient 50 - 74%     Transfers Chair/bed transfer  Transfers assist     Chair/bed transfer assist level: Minimal Assistance - Patient > 75%     Locomotion Ambulation   Ambulation assist      Assist level: Moderate Assistance - Patient 50 - 74% Assistive device: Walker-rolling Max distance: 15   Walk 10 feet activity   Assist  Walk 10 feet activity did not occur: Safety/medical concerns  Assist level: Moderate Assistance -  Patient - 50 - 74% Assistive device: Walker-rolling   Walk 50 feet activity   Assist Walk 50 feet with 2 turns activity did not occur: Safety/medical concerns         Walk 150 feet activity   Assist Walk 150 feet activity did not occur: Safety/medical concerns         Walk 10 feet on uneven surface  activity   Assist Walk 10 feet on uneven surfaces activity did not occur: Safety/medical concerns          Wheelchair     Assist Will patient use wheelchair at discharge?: No             Wheelchair 50 feet with 2 turns activity    Assist            Wheelchair 150 feet activity     Assist            Medical Problem List and Plan: 1.  Generalized weakness with limitations in self-care secondary to debility  Cont CIR 2.  Antithrombotics: -DVT/anticoagulation:  Pharmaceutical: Heparin             -antiplatelet therapy: On ASA 81 mg daily 3. Pain Management: On gabapentin 3 times daily for neuropathy with hydrocodone on PRN basis             Voltaren gel ordered for right wrist on 9/22--improved  Right knee pain with effusion   -7/2 xray with mild to moderate OA   -scheduled ice, apply voltaren gel   -?gout flare---recent uric acid level WNL, however 4. Mood: LCSW to follow for evaluation and support             -antipsychotic agents: N/A 5. Neuropsych: This patient is capable of making decisions on his own behalf. 6. Skin/Wound Care: Routine pressure relief measures 7. Fluids/Electrolytes/Nutrition: CM/renal diet with 1200 cc FR.  8.  T2DM with hyperglycemia: Hemoglobin A1c-12.4 and poorly controlled.  Continue Levemir twice daily and use SSI for tighter blood sugar control.  Monitor blood sugars AC/HS.   Levemir increased to 39 twice daily on 9/24  -added meal time scheduled covg 3u tid 9/26  Labile still. Observe for pattern today              . 9.  Hypertension: Monitor BP TID--follow-up with [name] for work-up to rule out RAS. Continue amlodipine, Coreg twice daily, clonidine TID    Furosemide decreased to daily 20 on 9/22  Albumin infusion ordered on 9/23  Inconsistent control on 9/27 10.  Anemia of chronic disease: Received IV iron             Labs with HD  Hemoglobin 9.5 on 9/24 11.  ESRD/chronic combined CHF: Anasarca multifactorial and improving with hemodialysis.             Daily weights  Discussed with nephrology on 9/23, plan for  fistula placement post discharge Filed Weights   07/23/19 0451 07/23/19 1836 07/24/19 0500  Weight: 134 kg 131.9 kg 132.5 kg   Stable on 9/27 12.  Right wrist pain-likely sprain/strain  X-ray personally reviewed, showing edema, but otherwise unremarkable  Uric acid within normal limits  Continue Voltaren gel   Brace    Continues to improve 13.  Leukocytosis  WBCs 11.7 on 9/24, labs with HD  Continue to monitor  LOS: 8 days A FACE TO FACE EVALUATION WAS PERFORMED  Meredith Staggers 07/24/2019, 8:26 AM

## 2019-07-24 NOTE — Progress Notes (Signed)
[facility] Progress Note    Assessment/ Plan:   48 y.o.malewitha PMH significant for dCHF, DM, HTN, HLD, morbid obesity, fatty liver disease, and CKD stage 4 followed by Dr. Lowanda Foster with nephrotic range proteinuria (documented in past as >11 grams/24 hours) who was recently hospitalized on 06/22/2019 [facility] with a 2 week history of worsening lower extremity edema and abdominal girth not responsive to increasing doses of torsemide as an outpatient. He has had accelerated HTN and workup for renal artery stenosis was underway with [name] however that has not been scheduled.During that hospitalization there were extensive discussions with the pt educating him about the need to initiate dialysis but he refused wanting to give a trial with outpt diuretics.   1. End-stage renal disease. Status post [facility] 07/08/2019 and first HD. 3rd HD 9/15, now on TTS.  Discussed permanent access with him -->heis finally willing to get perm access. After d/w CIR MD we will hold off till he's d/c to facilitate rehab.  Last HD 9/26. Adjust BP meds and maybe that will allow [place] to improve the edema; see below. Still has edema and we will likely be able to get more off as we titrate down on anti-htn  2. Hypertension. Should improve with ultrafiltration. On home dose of amlodipine. - had a hypotensive episode today and BP's trending down likely reflecting removal of all the excessive fluid. - Decreased  Carvedilol to 12.5 bid on 9/26 - decr clonidine from 0.3 tid to 0.2 BID on 9/26 (goal is to titrate off the clonidine) - BP looks very good and will decr Clonidine to 0.1mg  BID today and then try to get him off it in the next 48-72hrs.  3. Acute on chronic diastolic congestive heart failure. Improving with dialysis.  4. Malignant hypertension. Will monitor blood pressure with dialysis. Doubt intervention on any significant renal artery stenosis would make a difference in his  renal function.   5. Diabetes mellitus. Overall poor control with a hemoglobin A1c of 12.4.  6. Volume overload. Should improve with ultrafiltration on dialysis.  7. Anemia. Is iron replete. Hgb 10.1, will follow and dose Aranesp as appropriate-Aranesp 60 qweek  8. BMD. Cont phosphate binders-> incr to renvela 2tabs TIDM on 9/23.Will recheck level in a few days. Will req a phos with dialysis on Tuesday.  9. To CIR, CLIP  Subjective:   Rt knee locked up yest and unable to bend. Denies f/c/n/v/ dizziness and tolerated treatment yest.   Objective:   BP 117/74 (BP Location: Left Arm)   Pulse 93   Temp 98.2 F (36.8 C)   Resp 16   Ht 5\' 7"  (1.702 m)   Wt 132.5 kg   SpO2 98%   BMI 45.75 kg/m   Intake/Output Summary (Last 24 hours) at 07/24/2019 0736 Last data filed at 07/23/2019 2030 Gross per 24 hour  Intake 120 ml  Output 2800 ml  Net -2680 ml   Weight change: -2.1 kg  Physical Exam: General: AAOx3 NAD Neck: No JVD CV: Heart RRR  Lungs: clear anteriorly Abd: abd SNT/ND with normal BS Extremities: +1 bilateral lower extremity edema. ACCESS: R IJ TDC  Imaging: No results found.  Labs: BMET Recent Labs  Lab 07/19/19 1410 07/21/19 1636 07/23/19 1523  NA 131* 130* 129*  K 4.2 4.2 3.6  CL 90* 92* 91*  CO2 25 23 22   GLUCOSE 165* 151* 179*  BUN 69* 67* 58*  CREATININE 6.86* 6.97* 6.71*  CALCIUM 8.9 8.6* 8.6*  PHOS  5.9* 5.7* 6.1*   CBC Recent Labs  Lab 07/19/19 1410 07/21/19 1635 07/23/19 1523  WBC 11.6* 11.7* 10.4  HGB 9.9* 9.5* 9.5*  HCT 31.0* 29.6* 29.2*  MCV 78.1* 78.3* 78.9*  PLT 373 335 327    Medications:    . allopurinol  200 mg Oral Daily  . amLODipine  5 mg Oral QHS  . aspirin EC  81 mg Oral Daily  . atorvastatin  80 mg Oral Daily  . carvedilol  12.5 mg Oral BID WC  . Chlorhexidine Gluconate Cloth  6 each Topical Q0600  . cloNIDine  0.1 mg Oral BID  . darbepoetin (ARANESP) injection - DIALYSIS  60 mcg  Intravenous Q Thu-HD  . diclofenac sodium  2 g Topical QID  . ezetimibe  10 mg Oral QPM  . fluticasone  1 spray Each Nare Daily  . furosemide  20 mg Oral Daily  . gabapentin  100 mg Oral TID  . heparin  5,000 Units Subcutaneous Q8H  . insulin aspart  0-20 Units Subcutaneous TID WC  . insulin aspart  0-5 Units Subcutaneous QHS  . insulin aspart  3 Units Subcutaneous TID WC  . insulin detemir  39 Units Subcutaneous BID  . linaclotide  145 mcg Oral QAC breakfast  . sevelamer carbonate  1,600 mg Oral TID WC      Otelia Santee, MD 07/24/2019, 7:36 AM

## 2019-07-25 ENCOUNTER — Inpatient Hospital Stay (HOSPITAL_COMMUNITY): Payer: Medicare Other

## 2019-07-25 ENCOUNTER — Inpatient Hospital Stay (HOSPITAL_COMMUNITY): Payer: Medicare Other | Admitting: Occupational Therapy

## 2019-07-25 ENCOUNTER — Inpatient Hospital Stay (HOSPITAL_COMMUNITY): Payer: Medicare Other | Admitting: Physical Therapy

## 2019-07-25 DIAGNOSIS — N178 Other acute kidney failure: Secondary | ICD-10-CM

## 2019-07-25 DIAGNOSIS — N184 Chronic kidney disease, stage 4 (severe): Secondary | ICD-10-CM

## 2019-07-25 LAB — GLUCOSE, CAPILLARY
Glucose-Capillary: 108 mg/dL — ABNORMAL HIGH (ref 70–99)
Glucose-Capillary: 104 mg/dL — ABNORMAL HIGH (ref 70–99)
Glucose-Capillary: 214 mg/dL — ABNORMAL HIGH (ref 70–99)
Glucose-Capillary: 227 mg/dL — ABNORMAL HIGH (ref 70–99)
Glucose-Capillary: 301 mg/dL — ABNORMAL HIGH (ref 70–99)

## 2019-07-25 MED ORDER — METHYLPREDNISOLONE 4 MG PO TBPK
4.0000 mg | ORAL_TABLET | Freq: Three times a day (TID) | ORAL | Status: AC
  Administered 2019-07-26 (×2): 4 mg via ORAL

## 2019-07-25 MED ORDER — SEVELAMER CARBONATE 800 MG PO TABS
2400.0000 mg | ORAL_TABLET | Freq: Three times a day (TID) | ORAL | Status: DC
  Administered 2019-07-25 – 2019-08-03 (×23): 2400 mg via ORAL
  Filled 2019-07-25 (×26): qty 3

## 2019-07-25 MED ORDER — METHYLPREDNISOLONE 4 MG PO TBPK
8.0000 mg | ORAL_TABLET | Freq: Every evening | ORAL | Status: AC
  Administered 2019-07-25: 8 mg via ORAL

## 2019-07-25 MED ORDER — METHYLPREDNISOLONE 4 MG PO TBPK
8.0000 mg | ORAL_TABLET | Freq: Every morning | ORAL | Status: AC
  Administered 2019-07-25: 8 mg via ORAL
  Filled 2019-07-25: qty 21

## 2019-07-25 MED ORDER — METHYLPREDNISOLONE 4 MG PO TBPK
4.0000 mg | ORAL_TABLET | Freq: Four times a day (QID) | ORAL | Status: AC
  Administered 2019-07-27 – 2019-07-30 (×10): 4 mg via ORAL
  Filled 2019-07-25: qty 21

## 2019-07-25 MED ORDER — METHYLPREDNISOLONE 4 MG PO TBPK
4.0000 mg | ORAL_TABLET | ORAL | Status: AC
  Administered 2019-07-25: 4 mg via ORAL

## 2019-07-25 MED ORDER — METHYLPREDNISOLONE 4 MG PO TBPK
8.0000 mg | ORAL_TABLET | Freq: Every evening | ORAL | Status: AC
  Administered 2019-07-26: 21:00:00 8 mg via ORAL

## 2019-07-25 MED ORDER — RENA-VITE PO TABS
1.0000 | ORAL_TABLET | Freq: Every day | ORAL | Status: DC
  Administered 2019-07-25 – 2019-08-02 (×9): 1 via ORAL
  Filled 2019-07-25 (×9): qty 1

## 2019-07-25 NOTE — Progress Notes (Addendum)
Patient apologizing for his behavior towards staff earlier today, " just feeling stress with things' and the added discomfort of right knee pain. allowed to ventilate, emotional support provided

## 2019-07-25 NOTE — Progress Notes (Signed)
Patient ID: [facility], male   DOB: [birth year], 48 y.o.   MRN: [id number]  [name] Progress Note   Assessment/ Plan:   1.  Generalized weakness/debilitation following hospitalization and acute on chronic renal failure.  Ongoing management for acute right knee pain suspected to be from gout with topical Voltaren gel and Medrol taper pack. 2. ESRD: With proteinuric chronic kidney disease stage IV and progression over the recent hospitalizations now to requiring hemodialysis for renal replacement therapy.  Continue hemodialysis on TTS schedule while here in the hospital via his right IJ TDC.  Pursue permanent dialysis access as an outpatient with vascular surgery following discharge. 3. Anemia: Hemoglobin/hematocrit low but stable, no overt loss.  Continue ESA. 4. CKD-MBD: Calcium level acceptable with elevated phosphorus, uptitrate sevelamer 2400 mg 3 times daily AC. 5. Nutrition: Continue renal diet, begin renal multivitamin and continue Nepro for protein supplementation. 6. Hypertension: Blood pressure under satisfactory control including ultrafiltration/hemodialysis.  We will continue to monitor and discontinue clonidine tomorrow.  Subjective:   Reports to be feeling well and states that he is tired from therapy earlier today.   Objective:   BP 136/79 (BP Location: Right Arm)   Pulse 81   Temp 97.7 F (36.5 C)   Resp 18   Ht 5\' 7"  (1.702 m)   Wt 135.8 kg   SpO2 93%   BMI 46.89 kg/m   Physical Exam: Gen: Somnolent, resting comfortably in wheelchair CVS: Pulse regular rhythm, normal rate, S1 and S2 normal Resp: Clear to auscultation bilaterally, no rales or rhonchi.  Right IJ TDC Abd: Soft, obese, nontender Ext: 1+ pitting bilateral lower extremity edema  Labs: BMET Recent Labs  Lab 07/19/19 1410 07/21/19 1636 07/23/19 1523  NA 131* 130* 129*  K 4.2 4.2 3.6  CL 90* 92* 91*  CO2 25 23 22   GLUCOSE 165* 151* 179*  BUN 69* 67* 58*  CREATININE 6.86* 6.97*  6.71*  CALCIUM 8.9 8.6* 8.6*  PHOS 5.9* 5.7* 6.1*   CBC Recent Labs  Lab 07/19/19 1410 07/21/19 1635 07/23/19 1523  WBC 11.6* 11.7* 10.4  HGB 9.9* 9.5* 9.5*  HCT 31.0* 29.6* 29.2*  MCV 78.1* 78.3* 78.9*  PLT 373 335 327   Medications:    . allopurinol  200 mg Oral Daily  . amLODipine  5 mg Oral QHS  . aspirin EC  81 mg Oral Daily  . atorvastatin  80 mg Oral Daily  . carvedilol  12.5 mg Oral BID WC  . Chlorhexidine Gluconate Cloth  6 each Topical Q0600  . cloNIDine  0.1 mg Oral BID  . darbepoetin (ARANESP) injection - DIALYSIS  60 mcg Intravenous Q Thu-HD  . diclofenac sodium  2 g Topical QID  . ezetimibe  10 mg Oral QPM  . fluticasone  1 spray Each Nare Daily  . furosemide  20 mg Oral Daily  . gabapentin  100 mg Oral TID  . heparin  5,000 Units Subcutaneous Q8H  . insulin aspart  0-20 Units Subcutaneous TID WC  . insulin aspart  0-5 Units Subcutaneous QHS  . insulin aspart  3 Units Subcutaneous TID WC  . insulin detemir  39 Units Subcutaneous BID  . linaclotide  145 mcg Oral QAC breakfast  . methylPREDNISolone  4 mg Oral PC lunch  . methylPREDNISolone  4 mg Oral PC supper  . [START ON 07/26/2019] methylPREDNISolone  4 mg Oral 3 x daily with food  . [START ON 07/27/2019] methylPREDNISolone  4 mg Oral 4X  daily taper  . methylPREDNISolone  8 mg Oral Nightly  . [START ON 07/26/2019] methylPREDNISolone  8 mg Oral Nightly  . sevelamer carbonate  1,600 mg Oral TID WC   Elmarie Shiley, MD 07/25/2019, 2:20 PM

## 2019-07-25 NOTE — Plan of Care (Signed)
  Problem: Consults Goal: Diabetes Guidelines if Diabetic/Glucose > 140 Description: If diabetic or lab glucose is > 140 mg/dl - Initiate Diabetes/Hyperglycemia Guidelines & Document Interventions  Outcome: Progressing   Problem: RH SAFETY Goal: RH STG ADHERE TO SAFETY PRECAUTIONS W/ASSISTANCE/DEVICE Description: STG Adhere to Safety Precautions With cues/reminders Assistance/Device. Outcome: Progressing   Problem: RH PAIN MANAGEMENT Goal: RH STG PAIN MANAGED AT OR BELOW PT'S PAIN GOAL Description: At or below level 4 Outcome: Not Progressing

## 2019-07-25 NOTE — Progress Notes (Signed)
Occupational Therapy Session Note  Patient Details  Name: [facility] MRN: [id number] Date of Birth: November 21, 1970  Today's Date: 07/25/2019 OT Individual Time: [id number] OT Individual Time Calculation (min): 69 min    Short Term Goals: Week 2:  OT Short Term Goal 1 (Week 2): Pt will transfer to [facility] with min A OT Short Term Goal 2 (Week 2): Pt will complete sit <> stand with min A OT Short Term Goal 3 (Week 2): Pt will don pants with min A OT Short Term Goal 4 (Week 2): Pt will demonstrate increased endurance and stand for 1 grooming activity with min A  Skilled Therapeutic Interventions/Progress Updates:    Upon entering the room, pt seated in wheelchair with c/o R knee pain and discomfort. RN and MD are aware. Pt concerned over 3 schedule physical therapy sessions today. OT providing therapeutic use of self and discussed pain management and coping strategies. OT also provided paper handout regarding energy conservation for discharge. Pt was very active participant in education and verbalized understanding. Pt demonstrated B UE strengthening HEP with use of min verbal cuing for technique and level 2 theraband. Pt engaged in zoom ball activity x 5 bouts (20 reps each) in various directions. Pt needing cuing for pursed lip breathing with exercise and multiple rest breaks secondary to fatigue. Pt's BP while seated was 157/103 with HR of 101 bpm. Pt remained seated in wheelchair with call bell and all needed items within reach.   Therapy Documentation Precautions:  Precautions Precautions: Fall Restrictions Weight Bearing Restrictions: No General:   Vital Signs: Therapy Vitals Temp: 98 F (36.7 C) Temp Source: Oral Pulse Rate: 98 Resp: 18 BP: (!) 163/82 Patient Position (if appropriate): Lying Oxygen Therapy SpO2: 97 % O2 Device: Room Air   Therapy/Group: Individual Therapy  Gypsy Decant 07/25/2019, 9:26 AM

## 2019-07-25 NOTE — Progress Notes (Signed)
Physical Therapy Session Note  Patient Details  Name: Kelly Key MRN: [id number] Date of Birth: July 22, 1971  Today's Date: 07/25/2019 PT Individual Time: 1450-1530 PT Individual Time Calculation (min): 40 min   Short Term Goals: Week 1:  PT Short Term Goal 1 (Week 1): Pt will perform sit<>stand with min assist and LRAD PT Short Term Goal 2 (Week 1): Pt will ambulate  25 ft with min assist and LRAD PT Short Term Goal 3 (Week 1): Pt will perform bed<>chair transfer with min assist and LRAD  Skilled Therapeutic Interventions/Progress Updates:  Pt presented in w/c agreeable to therapy. Pt denies pain during session but demonstrating grimacing during standing activity. Pt transported to day room for energy conservation and performed strand pivot transfer to mat CGA. Performed LAQ 2 x 10 for general strengthening. Remaining session focused on standing tolerance with pt playing x 3 rounds of cornhole with RW support. Pt required seated break with first round, but was able to complete task without break on latter 2 bouts. Pt required therapeutic breaks between bouts as PTA provided edu on energy conservation. Pt returned to w/c in same manner as prior and returned to room. Pt remained in w/c at end of session with pt requesting nsg to check BS. Nsg notified and left with call bell within reach and current needs met.      Therapy Documentation Precautions:  Precautions Precautions: Fall Restrictions Weight Bearing Restrictions: No General:   Vital Signs: Therapy Vitals Temp: 97.7 F (36.5 C) Temp Source: Oral Pulse Rate: 81 Resp: 18 BP: 136/79 Patient Position (if appropriate): Sitting Oxygen Therapy SpO2: 93 % O2 Device: Room Air    Therapy/Group: Individual Therapy  Casha Estupinan  Eshani Maestre, PTA  07/25/2019, 4:10 PM

## 2019-07-25 NOTE — Progress Notes (Signed)
[place] PHYSICAL MEDICINE & REHABILITATION PROGRESS NOTE  Subjective/Complaints:  Right knee pain, hx gout.  Had it drained in the ER once, sometimes takes prednisone for it   ROS: Patient denies, nausea, vomiting, diarrhea, cough, shortness of breath or chest pain,     Objective: Vital Signs: Blood pressure (!) 163/82, pulse 98, temperature 98 F (36.7 C), temperature source Oral, resp. rate 18, height 5\' 7"  (1.702 m), weight 135.8 kg, SpO2 97 %. No results found. Recent Labs    07/23/19 1523  WBC 10.4  HGB 9.5*  HCT 29.2*  PLT 327   Recent Labs    07/23/19 1523  NA 129*  K 3.6  CL 91*  CO2 22  GLUCOSE 179*  BUN 58*  CREATININE 6.71*  CALCIUM 8.6*    Physical Exam: BP (!) 163/82   Pulse 98   Temp 98 F (36.7 C) (Oral)   Resp 18   Ht 5\' 7"  (1.702 m)   Wt 135.8 kg   SpO2 97%   BMI 46.89 kg/m  Constitutional: No distress . Vital signs reviewed. HEENT: EOMI, oral membranes moist Neck: supple Cardiovascular: RRR without murmur. No JVD    Respiratory: CTA Bilaterally without wheezes or rales. Normal effort    GI: BS +, non-tender, non-distended   Skin: Warm and dry.  Intact. Psych: flat but cooperative Musc: Right wrist with minimal radial tenderness today Right knee tender, swollen, warm, medial jt line tenderness Neurological: Alert. Bilateral upper extremities: 4/5 proximal to distal,   Bilateral lower extremities: 4--4/5 proximal to distal,   Skin: Vascular changes bilateral lower extremities   Assessment/Plan: 1. Functional deficits secondary to debility which require 3+ hours per day of interdisciplinary therapy in a comprehensive inpatient rehab setting.  Physiatrist is providing close team supervision and 24 hour management of active medical problems listed below.  Physiatrist and rehab team continue to assess barriers to discharge/monitor patient progress toward functional and medical goals  Care Tool:  Bathing  Bathing activity did not  occur: Safety/medical concerns Body parts bathed by patient: Right arm, Left arm, Chest, Abdomen   Body parts bathed by helper: Right arm, Left arm, Face(Did not attempt other parts) Body parts n/a: Buttocks, Right arm, Left arm, Chest, Abdomen   Bathing assist Assist Level: Supervision/Verbal cueing     Upper Body Dressing/Undressing Upper body dressing   What is the patient wearing?: Pull over shirt    Upper body assist Assist Level: Supervision/Verbal cueing    Lower Body Dressing/Undressing Lower body dressing    Lower body dressing activity did not occur: N/A What is the patient wearing?: Pants, Underwear/pull up     Lower body assist Assist for lower body dressing: Minimal Assistance - Patient > 75%     Toileting Toileting Toileting Activity did not occur (Clothing management and hygiene only): N/A (no void or bm)  Toileting assist Assist for toileting: Moderate Assistance - Patient 50 - 74%     Transfers Chair/bed transfer  Transfers assist     Chair/bed transfer assist level: Minimal Assistance - Patient > 75%     Locomotion Ambulation   Ambulation assist      Assist level: Moderate Assistance - Patient 50 - 74% Assistive device: Walker-rolling Max distance: 15   Walk 10 feet activity   Assist  Walk 10 feet activity did not occur: Safety/medical concerns  Assist level: Moderate Assistance - Patient - 50 - 74% Assistive device: Walker-rolling   Walk 50 feet activity   Assist Walk  50 feet with 2 turns activity did not occur: Safety/medical concerns         Walk 150 feet activity   Assist Walk 150 feet activity did not occur: Safety/medical concerns         Walk 10 feet on uneven surface  activity   Assist Walk 10 feet on uneven surfaces activity did not occur: Safety/medical concerns         Wheelchair     Assist Will patient use wheelchair at discharge?: No             Wheelchair 50 feet with 2 turns  activity    Assist            Wheelchair 150 feet activity     Assist            Medical Problem List and Plan: 1.  Generalized weakness with limitations in self-care secondary to debility- acute on chronic renal failure   Cont CIR 2.  Antithrombotics: -DVT/anticoagulation:  Pharmaceutical: Heparin             -antiplatelet therapy: On ASA 81 mg daily 3. Pain Management: On gabapentin 3 times daily for neuropathy with hydrocodone on PRN basis             Voltaren gel ordered for right wrist on 9/22--improved  Right knee pain gout medrol ordered  4. Mood: LCSW to follow for evaluation and support             -antipsychotic agents: N/A 5. Neuropsych: This patient is capable of making decisions on his own behalf. 6. Skin/Wound Care: Routine pressure relief measures 7. Fluids/Electrolytes/Nutrition: CM/renal diet with 1200 cc FR.  8.  T2DM with hyperglycemia: Hemoglobin A1c-12.4 and poorly controlled.  Continue Levemir twice daily and use SSI for tighter blood sugar control.  Monitor blood sugars AC/HS.   Levemir increased to 39 twice daily on 9/24  -added meal time scheduled covg 3u tid 9/26  Labile still. Observe for pattern today              . 9.  Hypertension: Monitor BP TID--follow-up with [name] for work-up to rule out RAS. Continue amlodipine, Coreg twice daily, clonidine TID    Furosemide decreased to daily 20 on 9/22   Vitals:   07/25/19 0539 07/25/19 0540  BP: (!) 169/95 (!) 163/82  Pulse: 98   Resp: 18   Temp: 98 F (36.7 C)   SpO2: 0000000   systolic elevation will monitor  10.  Anemia of chronic disease: Received IV iron             Labs with HD  Hemoglobin 9.5 on 9/26 11.  ESRD/chronic combined CHF: Anasarca multifactorial and improving with hemodialysis.             Daily weights  Discussed with nephrology on 9/23, plan for fistula placement post discharge Filed Weights   07/23/19 1836 07/24/19 0500 07/25/19 0539  Weight: 131.9 kg 132.5 kg  135.8 kg   Stable on 9/27 12.  Right knee pain- gout     Continue Voltaren gel   Brace    Add medrol dosepack 13.  Leukocytosis  WBCs 10.4 on 9/26  Resolved   LOS: 9 days A FACE TO FACE EVALUATION WAS PERFORMED  Charlett Blake 07/25/2019, 8:12 AM

## 2019-07-25 NOTE — Progress Notes (Addendum)
Brief Nutrition Note  RD consulted for diet education. RD working remotely.  Communicated with PA regarding plan for diet education. RD will plan to see pt in person on Wednesday, 07/27/19, to provide education and handouts. PA is aware and agrees with plan.  Noted pt received renal diet education while in acute care on 9/15.   Gaynell Face, MS, RD, LDN Inpatient Clinical Dietitian Pager: [phone] Weekend/After Hours: [phone]

## 2019-07-25 NOTE — Progress Notes (Signed)
Physical Therapy Weekly Progress Note  Patient Details  Name: Kelly Key MRN: [id number] Date of Birth: 12-13-1970  Beginning of progress report period: July 17, 2019 End of progress report period: July 25, 2019  Today's Date: 07/25/2019 PT Individual Time: 1111-1205 and 1300-1330 PT Individual Time Calculation (min): 54 min and 30 min   Patient has met 3 of 3 short term goals.  Pt progressing with functional mobility and able to tolerate increase standing and gait training today with min assist. Pt fluctuates with mobility based on fatigue level following dialysis, low blood pressure, LE pain and edema, all limiting pt's ability to ambulate.   Patient continues to demonstrate the following deficits muscle weakness and muscle joint tightness, decreased cardiorespiratoy endurance and decreased standing balance and decreased balance strategies and therefore will continue to benefit from skilled PT intervention to increase functional independence with mobility.  Patient progressing toward long term goals..  Continue plan of care.  PT Short Term Goals Week 1:  PT Short Term Goal 1 (Week 1): Pt will perform sit<>stand with min assist and LRAD PT Short Term Goal 1 - Progress (Week 1): Met PT Short Term Goal 2 (Week 1): Pt will ambulate  25 ft with min assist and LRAD PT Short Term Goal 2 - Progress (Week 1): Met PT Short Term Goal 3 (Week 1): Pt will perform bed<>chair transfer with min assist and LRAD PT Short Term Goal 3 - Progress (Week 1): Met Week 2:  PT Short Term Goal 1 (Week 2): Pt will perform transfers with CGA PT Short Term Goal 2 (Week 2): Pt will ambulate 50 ft with CGA and LRAD PT Short Term Goal 3 (Week 2): Pt will perform sit<>stands consistently with CGA  Skilled Therapeutic Interventions/Progress Updates:    Session 1: Pt seated in w/c upon PT arrival, agreeable to therapy tx and reports pain 8/10 in R LE. Pt with significant edema in B LEs, therapist providing  education on performing LE exercises, elevation and use of compression for edema management. Pt transported to the gym in the w/c. Pt reports feeling dizzy- BP 128/77. Pt performed stand pivot to the mat with min assist and RW, antalgic pattern noted. Pt reports his legs are feeling tight, worked on knee and ankle PROM and AROM this session. Pt performed 2 x 10 seated marches, LAQs and ankle pumps. Pt continues to report dizziness, BP in sitting 101/78, 83 bpm. Therapist donned ace wraps this session to B LEs for compression and also provided pt with elevating leg rests, education on using these to keep LEs elevated when in w/c. BP checked again in sitting- BP 119/88, 85 bpm. Pt agreeable to work on standing/ambulating, pt ambulated x 26 ft with RW and min assist, therapist brining w/c behind pt in case of fatigue and LE weakness. During gait cues for upright posture, antalgic gait noted with R LE knee pain, decreased R stance time. Pt transported back to room at end of session and left in w/c with needs in reach.   Session 2: Pt seated in w/c upon PT arrival, agreeable to therapy tx and reports pain 8/10 in R LE at rest, 10/10 in R LE when weightbearing/ambulating. Therapist assisted to doff ace wraps and don teds total assist this session for edema control and BP management, education provided on importance of wearing these. BP checked in sitting 132/86, HR 84 bpm. Pt denies dizziness this session. Pt performed sit<>Stand from w/c with RW and CGA, pt ambulated x  5 ft forwards/x 5 ft backwards within the room, min assist working on activity tolerance and gait. Pt performed x 20 seated LAQ and seated marches for strength and ROM. Pt left in w/c at end of session with needs in reach.    Therapy Documentation Precautions:  Precautions Precautions: Fall Restrictions Weight Bearing Restrictions: No    Therapy/Group: Individual Therapy  Netta Corrigan, PT, DPT, CSRS 07/25/19  7:51 AM   07/25/2019,  7:50 AM

## 2019-07-26 ENCOUNTER — Inpatient Hospital Stay (HOSPITAL_COMMUNITY): Payer: Medicare Other | Admitting: Occupational Therapy

## 2019-07-26 ENCOUNTER — Inpatient Hospital Stay (HOSPITAL_COMMUNITY): Payer: Medicare Other

## 2019-07-26 LAB — CBC
HCT: 29.6 % — ABNORMAL LOW (ref 39.0–52.0)
Hemoglobin: 9.3 g/dL — ABNORMAL LOW (ref 13.0–17.0)
MCH: 24.6 pg — ABNORMAL LOW (ref 26.0–34.0)
MCHC: 31.4 g/dL (ref 30.0–36.0)
MCV: 78.3 fL — ABNORMAL LOW (ref 80.0–100.0)
Platelets: 404 10*3/uL — ABNORMAL HIGH (ref 150–400)
RBC: 3.78 MIL/uL — ABNORMAL LOW (ref 4.22–5.81)
RDW: 15.4 % (ref 11.5–15.5)
WBC: 12 10*3/uL — ABNORMAL HIGH (ref 4.0–10.5)
nRBC: 0 % (ref 0.0–0.2)

## 2019-07-26 LAB — RENAL FUNCTION PANEL
Albumin: 2.6 g/dL — ABNORMAL LOW (ref 3.5–5.0)
Anion gap: 17 — ABNORMAL HIGH (ref 5–15)
BUN: 71 mg/dL — ABNORMAL HIGH (ref 6–20)
CO2: 22 mmol/L (ref 22–32)
Calcium: 9 mg/dL (ref 8.9–10.3)
Chloride: 92 mmol/L — ABNORMAL LOW (ref 98–111)
Creatinine, Ser: 6.54 mg/dL — ABNORMAL HIGH (ref 0.61–1.24)
GFR calc Af Amer: 10 mL/min — ABNORMAL LOW (ref >60)
GFR calc non Af Amer: 9 mL/min — ABNORMAL LOW (ref >60)
Glucose, Bld: 187 mg/dL — ABNORMAL HIGH (ref 70–99)
Phosphorus: 4.7 mg/dL — ABNORMAL HIGH (ref 2.5–4.6)
Potassium: 4.2 mmol/L (ref 3.5–5.1)
Sodium: 131 mmol/L — ABNORMAL LOW (ref 135–145)

## 2019-07-26 LAB — GLUCOSE, CAPILLARY
Glucose-Capillary: 227 mg/dL — ABNORMAL HIGH (ref 70–99)
Glucose-Capillary: 184 mg/dL — ABNORMAL HIGH (ref 70–99)
Glucose-Capillary: 190 mg/dL — ABNORMAL HIGH (ref 70–99)

## 2019-07-26 MED ORDER — LIDOCAINE HCL (PF) 1 % IJ SOLN: 5.0000 mL | INTRAMUSCULAR | Status: DC | PRN

## 2019-07-26 MED ORDER — SODIUM CHLORIDE 0.9 % IV SOLN: 100.0000 mL | INTRAVENOUS | Status: DC | PRN

## 2019-07-26 MED ORDER — PENTAFLUOROPROP-TETRAFLUOROETH EX AERO: 1.0000 "application " | INHALATION_SPRAY | CUTANEOUS | Status: DC | PRN

## 2019-07-26 MED ORDER — LIDOCAINE-PRILOCAINE 2.5-2.5 % EX CREA: 1.0000 "application " | TOPICAL_CREAM | CUTANEOUS | Status: DC | PRN

## 2019-07-26 MED ORDER — HEPARIN SODIUM (PORCINE) 1000 UNIT/ML IJ SOLN
INTRAMUSCULAR | Status: AC
  Filled 2019-07-26: qty 3

## 2019-07-26 MED ORDER — CARVEDILOL 25 MG PO TABS
25.0000 mg | ORAL_TABLET | Freq: Two times a day (BID) | ORAL | Status: DC
  Administered 2019-07-27 – 2019-08-03 (×13): 25 mg via ORAL
  Filled 2019-07-26 (×15): qty 1

## 2019-07-26 MED ORDER — ALTEPLASE 2 MG IJ SOLR: 2.0000 mg | Freq: Once | INTRAMUSCULAR | Status: DC | PRN

## 2019-07-26 MED ORDER — HEPARIN SODIUM (PORCINE) 1000 UNIT/ML DIALYSIS
1000.0000 [IU] | INTRAMUSCULAR | Status: DC | PRN
  Administered 2019-07-26: 3800 [IU] via INTRAVENOUS_CENTRAL
  Filled 2019-07-26: qty 1

## 2019-07-26 NOTE — Progress Notes (Signed)
Occupational Therapy Session Note  Patient Details  Name: WILIAN EBANKS MRN: [id number] Date of Birth: 08/06/1971  Today's Date: 07/26/2019 OT Individual Time: [id number] OT Individual Time Calculation (min): 59 min    Short Term Goals: Week 2:  OT Short Term Goal 1 (Week 2): Pt will transfer to [facility] with min A OT Short Term Goal 2 (Week 2): Pt will complete sit <> stand with min A OT Short Term Goal 3 (Week 2): Pt will don pants with min A OT Short Term Goal 4 (Week 2): Pt will demonstrate increased endurance and stand for 1 grooming activity with min A  Skilled Therapeutic Interventions/Progress Updates:    Pt completed wheelchair mobility with supervision and increased time to the tub/shower room.  Two rest breaks were needed to complete the task.  He then completed tub shower transfer with use of the tub bench and the RW and min guard assist.  Discussed need for a hand held shower as well as he cannot wash his UB and can only complete the LB, which he likes to do daily.  Next, had pt work on [place] therex with use of the green therapy band.  He completed 1 set each of shoulder flexion, shoulder horizontal abduction, elbow flexion, and elbow extension with min instructional cueing for all exercises.  Ten repetitions were completed for the shoulder exercises with 20 for the elbow exercises.  Finished session with return to the room and pt left sitting up in the wheelchair with the call button and phone in reach and chair alarm pad in place.    Therapy Documentation Precautions:  Precautions Precautions: Fall Restrictions Weight Bearing Restrictions: No  Pain: Pain Assessment Pain Score: 0-No pain    Therapy/Group: Individual Therapy  Girlie Veltri OTR/L 07/26/2019, 10:16 AM

## 2019-07-26 NOTE — Progress Notes (Signed)
On rounding Dr Kirstein'informed of abnormal BP'S results and urinary output > 2000cc /12 hr shift

## 2019-07-26 NOTE — Progress Notes (Signed)
[place] PHYSICAL MEDICINE & REHABILITATION PROGRESS NOTE  Subjective/Complaints:  No issues overnite   ROS: Patient denies, nausea, vomiting, diarrhea, cough, shortness of breath or chest pain,     Objective: Vital Signs: Blood pressure (!) 152/106, pulse (!) 108, temperature 97.7 F (36.5 C), temperature source Oral, resp. rate 17, height 5\' 7"  (1.702 m), weight 135.9 kg, SpO2 100 %. No results found. Recent Labs    07/23/19 1523  WBC 10.4  HGB 9.5*  HCT 29.2*  PLT 327   Recent Labs    07/23/19 1523  NA 129*  K 3.6  CL 91*  CO2 22  GLUCOSE 179*  BUN 58*  CREATININE 6.71*  CALCIUM 8.6*    Physical Exam: BP (!) 152/106 (BP Location: Left Arm)   Pulse (!) 108   Temp 97.7 F (36.5 C) (Oral)   Resp 17   Ht 5\' 7"  (1.702 m)   Wt 135.9 kg   SpO2 100%   BMI 46.92 kg/m  Constitutional: No distress . Vital signs reviewed. HEENT: EOMI, oral membranes moist Neck: supple Cardiovascular: RRR without murmur. No JVD    Respiratory: CTA Bilaterally without wheezes or rales. Normal effort    GI: BS +, non-tender, non-distended   Skin: Warm and dry.  Intact. Psych: flat but cooperative Musc: Right wrist with minimal radial tenderness today Right knee tender, swollen, warm, medial jt line tenderness Neurological: Alert. Bilateral upper extremities: 4/5 proximal to distal,   Bilateral lower extremities: 4--4/5 proximal to distal,   Skin: Vascular changes bilateral lower extremities   Assessment/Plan: 1. Functional deficits secondary to debility which require 3+ hours per day of interdisciplinary therapy in a comprehensive inpatient rehab setting.  Physiatrist is providing close team supervision and 24 hour management of active medical problems listed below.  Physiatrist and rehab team continue to assess barriers to discharge/monitor patient progress toward functional and medical goals  Care Tool:  Bathing  Bathing activity did not occur: Safety/medical  concerns Body parts bathed by patient: Right arm, Left arm, Chest, Abdomen   Body parts bathed by helper: Right arm, Left arm, Face(Did not attempt other parts) Body parts n/a: Buttocks, Right arm, Left arm, Chest, Abdomen   Bathing assist Assist Level: Supervision/Verbal cueing     Upper Body Dressing/Undressing Upper body dressing   What is the patient wearing?: Pull over shirt    Upper body assist Assist Level: Supervision/Verbal cueing    Lower Body Dressing/Undressing Lower body dressing    Lower body dressing activity did not occur: N/A What is the patient wearing?: Pants, Underwear/pull up     Lower body assist Assist for lower body dressing: Minimal Assistance - Patient > 75%     Toileting Toileting Toileting Activity did not occur (Clothing management and hygiene only): N/A (no void or bm)  Toileting assist Assist for toileting: Moderate Assistance - Patient 50 - 74%     Transfers Chair/bed transfer  Transfers assist     Chair/bed transfer assist level: Minimal Assistance - Patient > 75%     Locomotion Ambulation   Ambulation assist      Assist level: Minimal Assistance - Patient > 75% Assistive device: Walker-rolling Max distance: 26 ft   Walk 10 feet activity   Assist  Walk 10 feet activity did not occur: Safety/medical concerns  Assist level: Minimal Assistance - Patient > 75% Assistive device: Walker-rolling   Walk 50 feet activity   Assist Walk 50 feet with 2 turns activity did not occur: Safety/medical concerns  Walk 150 feet activity   Assist Walk 150 feet activity did not occur: Safety/medical concerns         Walk 10 feet on uneven surface  activity   Assist Walk 10 feet on uneven surfaces activity did not occur: Safety/medical concerns         Wheelchair     Assist Will patient use wheelchair at discharge?: No             Wheelchair 50 feet with 2 turns activity    Assist             Wheelchair 150 feet activity     Assist            Medical Problem List and Plan: 1.  Generalized weakness with limitations in self-care secondary to debility- acute on chronic renal failure   Cont CIR 2.  Antithrombotics: -DVT/anticoagulation:  Pharmaceutical: Heparin             -antiplatelet therapy: On ASA 81 mg daily 3. Pain Management: On gabapentin 3 times daily for neuropathy with hydrocodone on PRN basis             Voltaren gel ordered for right wrist on 9/22--improved  Right knee pain gout medrol ordered  4. Mood: LCSW to follow for evaluation and support             -antipsychotic agents: N/A 5. Neuropsych: This patient is capable of making decisions on his own behalf. 6. Skin/Wound Care: Routine pressure relief measures 7. Fluids/Electrolytes/Nutrition: CM/renal diet with 1200 cc FR.  8.  T2DM with hyperglycemia: Hemoglobin A1c-12.4 and poorly controlled.  Continue Levemir twice daily and use SSI for tighter blood sugar control.  Monitor blood sugars AC/HS.   Levemir increased to 39 twice daily on 9/24  -added meal time scheduled covg 3u tid 9/26  Labile still. Observe for pattern today              . 9.  Hypertension: Monitor BP TID--follow-up with [name] for work-up to rule out RAS. Continue amlodipine, Coreg twice daily, clonidine TID    Furosemide decreased to daily 20 on 9/22   Vitals:   07/26/19 0600 07/26/19 0610  BP: (!) 157/111 (!) 152/106  Pulse: (!) 108   Resp: 17   Temp: 97.7 F (36.5 C)   SpO2: 100%   Elevated defer to renal for management given that pt on HD 10.  Anemia of chronic disease: Received IV iron             Labs with HD  Hemoglobin 9.5 on 9/26 11.  ESRD/chronic combined CHF: Anasarca multifactorial and improving with hemodialysis.             Daily weights  Discussed with nephrology on 9/23, plan for fistula placement post discharge Filed Weights   07/24/19 0500 07/25/19 0539 07/26/19 0610  Weight: 132.5 kg 135.8 kg  135.9 kg   Stable on 9/27 12.  Right knee pain- gout     Continue Voltaren gel   Brace    Much improved on medrol dosepack 13.  Leukocytosis  WBCs 10.4 on 9/26  Resolved   LOS: 10 days A FACE TO FACE EVALUATION WAS PERFORMED  Charlett Blake 07/26/2019, 7:52 AM

## 2019-07-26 NOTE — Progress Notes (Signed)
Occupational Therapy Session Note  Patient Details  Name: Kelly Key MRN: [id number] Date of Birth: 08-May-1971  Today's Date: 07/26/2019 OT Individual Time: [id number] OT Individual Time Calculation (min): 59 min    Short Term Goals: Week 2:  OT Short Term Goal 1 (Week 2): Pt will transfer to [facility] with min A OT Short Term Goal 2 (Week 2): Pt will complete sit <> stand with min A OT Short Term Goal 3 (Week 2): Pt will don pants with min A OT Short Term Goal 4 (Week 2): Pt will demonstrate increased endurance and stand for 1 grooming activity with min A  Skilled Therapeutic Interventions/Progress Updates:    Upon entering the room, pt seated in wheelchair awaiting OT arrival and having just finished breakfast. Pt reports BP being elevated and perseverating on this topic. BP taken again with results of 144/99. Pt requesting to shower this session. OT assisted pt into bathroom via wheelchair and he performed stand pivot transfer onto TTB with min guard. Pt bathing at shower level with supervision overall and use of LH sponge to increase independence with self care tasks. Pt transferred back into wheelchair and seated at sink to dress self. Pt performed grooming tasks prior to OT arrival but did don dentures independently while seated. Pt donning pull over shirt with set up A to obtain needed items. Pt threading brief and pants over B feet and pulling over hips while standing with CGA. Pt needing total A to don B socks as he reports he will not be wearing at home and plans to wear "slip on shoes with back". Pt has no desire for education regarding use of AE to increase I with this self care task. Pt remained seated in wheelchair with call bell and all needs within reach. MD arrived for morning assessment as OT exited the room.   Therapy Documentation Precautions:  Precautions Precautions: Fall Restrictions Weight Bearing Restrictions: No   Pain: Pain Assessment Pain Score: 0-No  pain   Therapy/Group: Individual Therapy  Gypsy Decant 07/26/2019, 10:15 AM

## 2019-07-26 NOTE — Progress Notes (Signed)
Physical Therapy Session Note  Patient Details  Name: Kelly Key MRN: [id number] Date of Birth: 1970/11/19  Today's Date: 07/26/2019 PT Individual Time: 1100-1200 PT Individual Time Calculation (min): 60 min   Short Term Goals: Week 2:  PT Short Term Goal 1 (Week 2): Pt will perform transfers with CGA PT Short Term Goal 2 (Week 2): Pt will ambulate 50 ft with CGA and LRAD PT Short Term Goal 3 (Week 2): Pt will perform sit<>stands consistently with CGA  Skilled Therapeutic Interventions/Progress Updates:    Pt seated in w/c upon PT arrival, agreeable to therapy tx and denies pain. Pt transported to the gym. Pt ambulated x 155 ft with RW and CGA, therapist bringing w/c behind pt, cues for upright posture during gait. Pt performed sit<>stand with supervision and stepped on/off scale this session with min assist, pt wanting to weight himself. Pt ambulated x 30 ft to the nustep with RW and CGA, pt transferred to nustep. Pt used nustep this session x 10 minutes using B UEs/LEs on resistance level 5 for global strengthening and endurance, x 10 minutes total. Pt transferred to w/c stand pivot with CGA and RW, transported to the gym. Pt ascended/descended 6 steps (3 inch) with B rails and CGA, step to pattern, increased work of breathing noted, cues for techniques, heavy reliance on UEs noted. Pt ascended/descended 4 steps (6 inch) with B rails and step to pattern, working on LE strength and endurance, increased work of breathing noted, min assist. Pt ambulated x 80 ft back towards room with RW and supervision, limited by fatigue, therapist transported pt the rest of the way back to room. Pt left in w/c at end of session with needs in reach and chair alarm set.   Therapy Documentation Precautions:  Precautions Precautions: Fall Restrictions Weight Bearing Restrictions: No    Therapy/Group: Individual Therapy  Netta Corrigan, PT, DPT, CSRS 07/26/2019, 11:11 AM

## 2019-07-26 NOTE — Progress Notes (Addendum)
Patient ID: Kelly Key, male   DOB: January 08, 1971, 48 y.o.   MRN: [id number]  [facility] Progress Note   Assessment/ Plan:   1.  Generalized weakness/debilitation following hospitalization and acute on chronic renal failure.  Ongoing management for acute right knee pain suspected to be from gout with topical Voltaren gel and Medrol taper pack. 2. ESRD: With proteinuric chronic kidney disease stage IV and progression over the recent hospitalizations now to requiring hemodialysis for renal replacement therapy.  Continue hemodialysis on TTS schedule while here in the hospital via his right IJ [facility] with daily labs to assess for ?possible renal recovery? Based on high UOP.  Pursue permanent dialysis access as an outpatient with vascular surgery following discharge. 3. Anemia: Hemoglobin/hematocrit low but stable, no overt loss.  Continue ESA. 4. CKD-MBD: Calcium level acceptable with elevated phosphorus, uptitrate sevelamer 2400 mg 3 times daily AC. 5. Nutrition: Continue renal diet, begin renal multivitamin and continue Nepro for protein supplementation. 6. Hypertension: Blood pressure elevated--increase carvedilol to 25mg  PO BID.  Subjective:   Reports that right knee pain is better and his urine output is much higher.   Objective:   BP (!) 152/106 (BP Location: Left Arm)   Pulse (!) 108   Temp 97.7 F (36.5 C) (Oral)   Resp 17   Ht 5\' 7"  (1.702 m)   Wt 135.9 kg   SpO2 100%   BMI 46.92 kg/m   Physical Exam: Gen: Comfortable, resting comfortably in wheelchair CVS: Pulse regular rhythm, normal rate, S1 and S2 normal Resp: Clear to auscultation bilaterally, no rales or rhonchi.  Right IJ TDC Abd: Soft, obese, nontender Ext: 1+ pitting bilateral lower extremity edema  Labs: BMET Recent Labs  Lab 07/19/19 1410 07/21/19 1636 07/23/19 1523  NA 131* 130* 129*  K 4.2 4.2 3.6  CL 90* 92* 91*  CO2 25 23 22   GLUCOSE 165* 151* 179*  BUN 69* 67* 58*  CREATININE 6.86* 6.97*  6.71*  CALCIUM 8.9 8.6* 8.6*  PHOS 5.9* 5.7* 6.1*   CBC Recent Labs  Lab 07/19/19 1410 07/21/19 1635 07/23/19 1523  WBC 11.6* 11.7* 10.4  HGB 9.9* 9.5* 9.5*  HCT 31.0* 29.6* 29.2*  MCV 78.1* 78.3* 78.9*  PLT 373 335 327   Medications:    . allopurinol  200 mg Oral Daily  . amLODipine  5 mg Oral QHS  . aspirin EC  81 mg Oral Daily  . atorvastatin  80 mg Oral Daily  . carvedilol  12.5 mg Oral BID WC  . Chlorhexidine Gluconate Cloth  6 each Topical Q0600  . cloNIDine  0.1 mg Oral BID  . darbepoetin (ARANESP) injection - DIALYSIS  60 mcg Intravenous Q Thu-HD  . diclofenac sodium  2 g Topical QID  . ezetimibe  10 mg Oral QPM  . fluticasone  1 spray Each Nare Daily  . gabapentin  100 mg Oral TID  . heparin  5,000 Units Subcutaneous Q8H  . insulin aspart  0-20 Units Subcutaneous TID WC  . insulin aspart  0-5 Units Subcutaneous QHS  . insulin aspart  3 Units Subcutaneous TID WC  . insulin detemir  39 Units Subcutaneous BID  . linaclotide  145 mcg Oral QAC breakfast  . methylPREDNISolone  4 mg Oral 3 x daily with food  . [START ON 07/27/2019] methylPREDNISolone  4 mg Oral 4X daily taper  . methylPREDNISolone  8 mg Oral Nightly  . multivitamin  1 tablet Oral QHS  . sevelamer carbonate  2,400 mg Oral TID WC   Elmarie Shiley, MD 07/26/2019, 10:26 AM

## 2019-07-26 NOTE — Procedures (Signed)
Patient seen on Hemodialysis. BP (!) 173/100 (BP Location: Left Arm)   Pulse 92   Temp 97.8 F (36.6 C) (Oral)   Resp 16   Ht 5\' 7"  (1.702 m)   Wt 135.9 kg   SpO2 100%   BMI 46.92 kg/m   QB 400, UF goal 3L Tolerating treatment without complaints at this time.   Elmarie Shiley MD [facility]. Office # [phone] Pager # [phone] 4:43 PM

## 2019-07-27 ENCOUNTER — Inpatient Hospital Stay (HOSPITAL_COMMUNITY): Payer: Medicare Other

## 2019-07-27 ENCOUNTER — Inpatient Hospital Stay (HOSPITAL_COMMUNITY): Payer: Medicare Other | Admitting: Physical Therapy

## 2019-07-27 ENCOUNTER — Inpatient Hospital Stay (HOSPITAL_COMMUNITY): Payer: Medicare Other | Admitting: Occupational Therapy

## 2019-07-27 LAB — BASIC METABOLIC PANEL
Anion gap: 15 (ref 5–15)
BUN: 40 mg/dL — ABNORMAL HIGH (ref 6–20)
CO2: 24 mmol/L (ref 22–32)
Calcium: 8.7 mg/dL — ABNORMAL LOW (ref 8.9–10.3)
Chloride: 95 mmol/L — ABNORMAL LOW (ref 98–111)
Creatinine, Ser: 4.23 mg/dL — ABNORMAL HIGH (ref 0.61–1.24)
GFR calc Af Amer: 18 mL/min — ABNORMAL LOW (ref >60)
GFR calc non Af Amer: 15 mL/min — ABNORMAL LOW (ref >60)
Glucose, Bld: 264 mg/dL — ABNORMAL HIGH (ref 70–99)
Potassium: 4 mmol/L (ref 3.5–5.1)
Sodium: 134 mmol/L — ABNORMAL LOW (ref 135–145)

## 2019-07-27 LAB — GLUCOSE, CAPILLARY
Glucose-Capillary: 197 mg/dL — ABNORMAL HIGH (ref 70–99)
Glucose-Capillary: 134 mg/dL — ABNORMAL HIGH (ref 70–99)
Glucose-Capillary: 194 mg/dL — ABNORMAL HIGH (ref 70–99)
Glucose-Capillary: 345 mg/dL — ABNORMAL HIGH (ref 70–99)

## 2019-07-27 MED ORDER — INSULIN DETEMIR 100 UNIT/ML ~~LOC~~ SOLN
40.0000 [IU] | Freq: Two times a day (BID) | SUBCUTANEOUS | Status: DC
  Administered 2019-07-27 – 2019-08-01 (×10): 40 [IU] via SUBCUTANEOUS
  Filled 2019-07-27 (×12): qty 0.4

## 2019-07-27 MED ORDER — CLONIDINE HCL 0.1 MG PO TABS
0.1000 mg | ORAL_TABLET | Freq: Three times a day (TID) | ORAL | Status: DC
  Administered 2019-07-27 – 2019-08-03 (×19): 0.1 mg via ORAL
  Filled 2019-07-27 (×20): qty 1

## 2019-07-27 NOTE — Plan of Care (Signed)
Nutrition Education Note  RD consulted for Renal Education. Noted that pt received education from RD during acute admission on 07/12/19.  Pt reports that he has been making changes to his diet during admission. Pt also reports that his kidneys may have "woken up" per his discussion with Nephrologist this morning. Pt is hopeful that this is the case and hopeful that he will not require outpatient HD.  Provided handouts from the [employer]. Reviewed food groups and provided written recommended serving sizes specifically determined for patient's current nutritional status.   Explained why diet restrictions are needed and provided lists of foods to limit/avoid that are high potassium, sodium, and phosphorus. Provided specific recommendations on safer alternatives of these foods. Strongly encouraged compliance of this diet.  Discussed importance of protein intake at each meal and snack. Provided examples of how to maximize protein intake throughout the day. Discussed need for fluid restriction with dialysis, importance of minimizing weight gain between HD treatments, and renal-friendly beverage options.  Encouraged pt to discuss specific diet questions/concerns with RD at HD outpatient facility. Teach back method used.  Expect good compliance.  Body mass index is 45.54 kg/m. Pt meets criteria for obesity class III based on current BMI.  Current diet order is Renal/Carb Modified with 1200 mL fluid restriction, patient is consuming approximately 100% of meals at this time. Labs and medications reviewed. No further nutrition interventions warranted at this time. RD contact information provided. If additional nutrition issues arise, please re-consult RD.   Gaynell Face, MS, RD, LDN Inpatient Clinical Dietitian Pager: [phone] Weekend/After Hours: [phone]

## 2019-07-27 NOTE — Progress Notes (Signed)
Occupational Therapy Session Note  Patient Details  Name: Kelly Key MRN: [id number] Date of Birth: 27-Jul-1971  Today's Date: 07/27/2019 OT Individual Time: 0800-0901 OT Individual Time Calculation (min): 61 min    Short Term Goals: Week 2:  OT Short Term Goal 1 (Week 2): Pt will transfer to [facility] with min A OT Short Term Goal 2 (Week 2): Pt will complete sit <> stand with min A OT Short Term Goal 3 (Week 2): Pt will don pants with min A OT Short Term Goal 4 (Week 2): Pt will demonstrate increased endurance and stand for 1 grooming activity with min A  Skilled Therapeutic Interventions/Progress Updates:    Pt with elevated BP this am.  Nursing just administered medications when this therapist came into the room.  BP in sitting 180/107 and 176/104 to start.  Took pt down to the dayroom via wheelchair to start session.  Performed one interval of mobility approximately 59' with use of the RW and min guard assist.  BP taken again in sitting at 155/106.  Had him then transition to the Nustep for light exercise with resistance set at level 4 for 2 sets of 5 and 6 mins.  Average number of steps per minute at 32 with each set as not to exert pt too much.  He completed transfer back to the wheelchair with min guard assist and min instructional cueing for hand placement.  Pt left up in the wheelchair with chair alarm in place and call button and phone in reach.    Therapy Documentation Precautions:  Precautions Precautions: Fall Restrictions Weight Bearing Restrictions: No   Vital Signs: Therapy Vitals Pulse Rate: 92 BP: (!) 155/106 Patient Position (if appropriate): Sitting Oxygen Therapy O2 Device: Room Air Pain: Pain Assessment Pain Score: 0-No pain ADL:  See Care Tool Section for some details of ADL tasks  Therapy/Group: Individual Therapy  Johnmichael Melhorn OTR/L 07/27/2019, 9:28 AM

## 2019-07-27 NOTE — Progress Notes (Signed)
Physical Therapy Session Note  Patient Details  Name: Kelly Key MRN: [id number] Date of Birth: August 14, 1971  Today's Date: 07/27/2019 PT Individual Time: 1520-1630 PT Individual Time Calculation (min): 70 min   Short Term Goals: Week 2:  PT Short Term Goal 1 (Week 2): Pt will perform transfers with CGA PT Short Term Goal 2 (Week 2): Pt will ambulate 50 ft with CGA and LRAD PT Short Term Goal 3 (Week 2): Pt will perform sit<>stands consistently with CGA  Skilled Therapeutic Interventions/Progress Updates:    Pt seated in w/c upon PT arrival, agreeable to therapy tx and denies pain. Pt transported to the gym in w/c. Pt ambulated x 85 ft this session with RW and CGA working on activity tolerance and endurance, cues for upright posture, pt reports his legs feel weak. Pt transferred to the mat min assist. Pt worked on LE strength and balance with UE support on RW to perform toe taps on aerobic step 2 x 20 with CGA. Pt worked on LE strengthening to perform x 5 and x 10 sit<>stands this session, using one hand on RW and one hand to help push up due to LE weakness. Pt ambulated 2 x 52 ft this session with RW and CGA, working on endurance, limited by fatigue and LE weakness. Between standing activities pt performed seated marches and LAQs x 20 of each. Pt worked on standing balance without UE support this session to perform ball toss activity against rebounder trampoline, x 2 trials with CGA. Pt transported back to room, pt's girlfriend present in room. Discussed pt's current level of function and progress towards goals, along with d/c planning. Pt left seated in w/c at end of session with needs in reach and chair alarm set.   Therapy Documentation Precautions:  Precautions Precautions: Fall Restrictions Weight Bearing Restrictions: No    Therapy/Group: Individual Therapy   Netta Corrigan, PT, DPT, CSRS 07/27/19  12:55 PM

## 2019-07-27 NOTE — Progress Notes (Signed)
Physical Therapy Session Note  Patient Details  Name: Kelly Key MRN: [id number] Date of Birth: 08/17/1971  Today's Date: 07/27/2019 PT Individual Time: 1021-1115 PT Individual Time Calculation (min): 54 min   Short Term Goals: Week 2:  PT Short Term Goal 1 (Week 2): Pt will perform transfers with CGA PT Short Term Goal 2 (Week 2): Pt will ambulate 50 ft with CGA and LRAD PT Short Term Goal 3 (Week 2): Pt will perform sit<>stands consistently with CGA  Skilled Therapeutic Interventions/Progress Updates:  Pt received in w/c & agreeable to tx. Vitals checked, seel below. Transported pt to/from gym via w/c dependent assist for time management. Pt transfers sit>stand with CGA progressing to supervision and ambulates 50 ft x 2 with RW & CGA<>supervision (requires CGA for turns) with forward flexed posture and decreased stride length. Pt declined practicing stair negotiation today 2/2 fatigue & requires heavy encouragement to attempt BLE standing exercises. Pt then performs BLE hip/knee flexion with BUE support on RW & CGA for balance, with pt performing 5 reps on each LE for first trial then 10 reps on each LE for 2nd trial with rest break in between. Pt then ambulates 125 ft with RW & supervision with cuing for decreased speed especially when turning; pt appears to rely heavily on BUE on RW for support during gait. Pt propels w/c back to room with BUE & 1 rest break 2/2 fatigue but supervision overall. Pt elects to perform hand hygiene at sink from w/c level with mod I. At end of session pt left in w/c with chair alarm donned & all needs in reach.  Throughout session therapist provides cuing for hand placement for stand>sit transfers (reaching back for seated surface) with pt return demonstrating with ongoing cuing.  Pt requires seated rest breaks throughout session 2/2 fatigue.  Therapy Documentation Precautions:  Precautions Precautions: Fall Restrictions Weight Bearing Restrictions: No     Vital Signs: Sitting in w/c at beginning of session: BP = 144/93 mmHg (LUE) HR = 72 bpm  Pain:  Pt denied c/o pain.   Therapy/Group: Individual Therapy  Waunita Schooner 07/27/2019, 11:17 AM

## 2019-07-27 NOTE — Progress Notes (Signed)
[facility] PHYSICAL MEDICINE & REHABILITATION PROGRESS NOTE  Subjective/Complaints:  Appreciate renal note, RIght knee doing better Elevated BP addressed by Nephro  ROS: Patient denies, nausea, vomiting, diarrhea, cough, shortness of breath or chest pain,     Objective: Vital Signs: Blood pressure (!) 155/106, pulse 92, temperature 98.5 F (36.9 C), temperature source Oral, resp. rate 18, height _0  (1.702 m), weight 131.9 kg, SpO2 99 %. No results found. Recent Labs    07/26/19 1539  WBC 12.0*  HGB 9.3*  HCT 29.6*  PLT 404*   Recent Labs    07/26/19 1250 07/27/19 0757  NA 131* 134*  K 4.2 4.0  CL 92* 95*  CO2 22 24  GLUCOSE 187* 264*  BUN 71* 40*  CREATININE 6.54* 4.23*  CALCIUM 9.0 8.7*    Physical Exam: BP (!) 155/106   Pulse 92   Temp 98.5 F (36.9 C) (Oral)   Resp 18   Ht _1  (1.702 m)   Wt 131.9 kg   SpO2 99%   BMI 45.54 kg/m  Constitutional: No distress . Vital signs reviewed. HEENT: EOMI, oral membranes moist Neck: supple Cardiovascular: RRR without murmur. No JVD    Respiratory: CTA Bilaterally without wheezes or rales. Normal effort    GI: BS +, non-tender, non-distended   Skin: Warm and dry.  Intact. Psych: flat but cooperative Musc: Right wrist with minimal radial tenderness today Right knee tender, swollen, warm, medial jt line tenderness Neurological: Alert. Bilateral upper extremities: 4/5 proximal to distal,   Bilateral lower extremities: 4--4/5 proximal to distal,   Skin: Vascular changes bilateral lower extremities   Assessment/Plan: 1. Functional deficits secondary to debility which require 3+ hours per day of interdisciplinary therapy in a comprehensive inpatient rehab setting.  Physiatrist is providing close team supervision and 24 hour management of active medical problems listed below.  Physiatrist and rehab team continue to assess barriers to discharge/monitor patient progress toward functional and medical goals  Care  Tool:  Bathing  Bathing activity did not occur: Safety/medical concerns Body parts bathed by patient: Right arm, Left arm, Chest, Abdomen, Front perineal area, Buttocks, Right upper leg, Left upper leg, Right lower leg, Left lower leg, Face   Body parts bathed by helper: Right arm, Left arm, Face(Did not attempt other parts) Body parts n/a: Buttocks, Right arm, Left arm, Chest, Abdomen   Bathing assist Assist Level: Supervision/Verbal cueing     Upper Body Dressing/Undressing Upper body dressing   What is the patient wearing?: Pull over shirt    Upper body assist Assist Level: Set up assist    Lower Body Dressing/Undressing Lower body dressing    Lower body dressing activity did not occur: N/A What is the patient wearing?: Pants, Underwear/pull up     Lower body assist Assist for lower body dressing: Contact Guard/Touching assist     Toileting Toileting Toileting Activity did not occur (Clothing management and hygiene only): N/A (no void or bm)  Toileting assist Assist for toileting: Moderate Assistance - Patient 50 - 74%     Transfers Chair/bed transfer  Transfers assist     Chair/bed transfer assist level: Contact Guard/Touching assist     Locomotion Ambulation   Ambulation assist      Assist level: Contact Guard/Touching assist Assistive device: Walker-rolling Max distance: 40'   Walk 10 feet activity   Assist  Walk 10 feet activity did not occur: Safety/medical concerns  Assist level: Contact Guard/Touching assist Assistive device: Walker-rolling   Walk 50  feet activity   Assist Walk 50 feet with 2 turns activity did not occur: Safety/medical concerns  Assist level: Contact Guard/Touching assist Assistive device: Walker-rolling    Walk 150 feet activity   Assist Walk 150 feet activity did not occur: Safety/medical concerns  Assist level: Contact Guard/Touching assist Assistive device: Walker-rolling    Walk 10 feet on uneven  surface  activity   Assist Walk 10 feet on uneven surfaces activity did not occur: Safety/medical concerns         Wheelchair     Assist Will patient use wheelchair at discharge?: No             Wheelchair 50 feet with 2 turns activity    Assist            Wheelchair 150 feet activity     Assist            Medical Problem List and Plan: 1.  Generalized weakness with limitations in self-care secondary to debility- acute on chronic renal failure   Cont CIR PT,OT,  SLP  Team conference today please see physician documentation under team conference tab, met with team face-to-face to discuss problems,progress, and goals. Formulized individual treatment plan based on medical history, underlying problem and comorbidities. 2.  Antithrombotics: -DVT/anticoagulation:  Pharmaceutical: Heparin             -antiplatelet therapy: On ASA 81 mg daily 3. Pain Management: On gabapentin 3 times daily for neuropathy with hydrocodone on PRN basis             Voltaren gel ordered for right wrist on 9/22--improved  Right knee pain gout medrol ordered  4. Mood: LCSW to follow for evaluation and support             -antipsychotic agents: N/A 5. Neuropsych: This patient is capable of making decisions on his own behalf. 6. Skin/Wound Care: Routine pressure relief measures 7. Fluids/Electrolytes/Nutrition: CM/renal diet with 1200 cc FR.  8.  T2DM with hyperglycemia: Hemoglobin A1c-12.4 and poorly controlled.  Continue Levemir twice daily and use SSI for tighter blood sugar control.  Monitor blood sugars AC/HS.   Levemir increased to 39 twice daily on 9/24  -added meal time scheduled covg 3u tid 9/26  Labile still. Observe for pattern today              . CBG (last 3)  Recent Labs    07/26/19 1155 07/26/19 2027 07/27/19 0626  GLUCAP 184* 190* 197*  may increase to 40U levemir BID 9.  Hypertension: Monitor BP TID--follow-up with [name] for work-up to rule out RAS.  Continue amlodipine, Coreg twice daily, clonidine TID    Furosemide decreased to daily 20 on 9/22   Vitals:   07/27/19 0440 07/27/19 0847  BP: (!) 167/110 (!) 155/106  Pulse: 98 92  Resp: 18   Temp: 98.5 F (36.9 C)   SpO2: 99%   Elevated defer to renal for management given that pt on HD- carvedilol increased 9/29 pm dose 10.  Anemia of chronic disease: Received IV iron             Labs with HD  Hemoglobin 9.3 on 9/29 11.  ESRD/chronic combined CHF: Anasarca multifactorial and improving with hemodialysis.             Daily weights  Discussed with nephrology on 9/23, plan for fistula placement post discharge Filed Weights   07/26/19 0610 07/26/19 1511 07/26/19 1927  Weight: 135.9 kg 135.5 kg  131.9 kg   Stable on 9/30 12.  Right knee pain- gout     Continue Voltaren gel   Brace    Much improved on medrol dosepack 13.  Leukocytosis  WBCs 10.4 on 9/26  Resolved   LOS: 11 days A FACE TO [place] E Jefte Carithers 07/27/2019, 9:12 AM

## 2019-07-27 NOTE — Progress Notes (Signed)
Patient ID: Kelly Key, male   DOB: Jan 28, 1971, 48 y.o.   MRN: [id number]   [facility] Progress Note   Assessment/ Plan:   1.  Generalized weakness/debilitation following hospitalization and acute on chronic renal failure.  Improving acute right knee pain suspected to be from gout with topical Voltaren gel and Medrol taper pack. 2. ESRD: With proteinuric chronic kidney disease stage IV and progression over the recent hospitalizations now to requiring hemodialysis for renal replacement therapy.  On hemodialysis on TTS schedule while here but with impressively high UOP and possible signs of renal recovery--will do daily labs to assess for this.  Pursue permanent dialysis access as an outpatient with vascular surgery following discharge if still on HD. 3. Anemia: Hemoglobin/hematocrit low but stable, no overt loss.  Continue ESA. 4. CKD-MBD: Calcium level acceptable with elevated phosphorus, uptitrate sevelamer 2400 mg 3 times daily AC. 5. Nutrition: Continue renal diet, begin renal multivitamin and continue Nepro for protein supplementation. 6. Hypertension: Blood pressure elevated--increased carvedilol yesterday to 25mg  PO BID and will restart lasix if UOP slows down.  Subjective:   Right knee pain better and without chest pain or shortness of breath.   Objective:   BP (!) 144/93 (BP Location: Left Arm)   Pulse 72   Temp 98.5 F (36.9 C) (Oral)   Resp 18   Ht 5\' 7"  (1.702 m)   Wt 131.9 kg   SpO2 99%   BMI 45.54 kg/m   Physical Exam: Gen: Comfortable, participating in PT/OT exercises CVS: Pulse regular rhythm, normal rate, S1 and S2 normal Resp: Clear to auscultation bilaterally, no rales or rhonchi.  Right IJ TDC Abd: Soft, obese, nontender Ext: 1+ pitting bilateral lower extremity edema  Labs: BMET Recent Labs  Lab 07/21/19 1636 07/23/19 1523 07/26/19 1250 07/27/19 0757  NA 130* 129* 131* 134*  K 4.2 3.6 4.2 4.0  CL 92* 91* 92* 95*  CO2 23 22 22 24    GLUCOSE 151* 179* 187* 264*  BUN 67* 58* 71* 40*  CREATININE 6.97* 6.71* 6.54* 4.23*  CALCIUM 8.6* 8.6* 9.0 8.7*  PHOS 5.7* 6.1* 4.7*  --    CBC Recent Labs  Lab 07/21/19 1635 07/23/19 1523 07/26/19 1539  WBC 11.7* 10.4 12.0*  HGB 9.5* 9.5* 9.3*  HCT 29.6* 29.2* 29.6*  MCV 78.3* 78.9* 78.3*  PLT 335 327 404*   Medications:    . allopurinol  200 mg Oral Daily  . amLODipine  5 mg Oral QHS  . aspirin EC  81 mg Oral Daily  . atorvastatin  80 mg Oral Daily  . carvedilol  25 mg Oral BID WC  . Chlorhexidine Gluconate Cloth  6 each Topical Q0600  . cloNIDine  0.1 mg Oral BID  . darbepoetin (ARANESP) injection - DIALYSIS  60 mcg Intravenous Q Thu-HD  . diclofenac sodium  2 g Topical QID  . ezetimibe  10 mg Oral QPM  . fluticasone  1 spray Each Nare Daily  . gabapentin  100 mg Oral TID  . heparin  5,000 Units Subcutaneous Q8H  . insulin aspart  0-20 Units Subcutaneous TID WC  . insulin aspart  0-5 Units Subcutaneous QHS  . insulin aspart  3 Units Subcutaneous TID WC  . insulin detemir  39 Units Subcutaneous BID  . linaclotide  145 mcg Oral QAC breakfast  . methylPREDNISolone  4 mg Oral 4X daily taper  . multivitamin  1 tablet Oral QHS  . sevelamer carbonate  2,400 mg Oral TID WC  Elmarie Shiley, MD 07/27/2019, 10:51 AM

## 2019-07-28 ENCOUNTER — Inpatient Hospital Stay (HOSPITAL_COMMUNITY): Payer: Medicare Other | Admitting: Occupational Therapy

## 2019-07-28 ENCOUNTER — Inpatient Hospital Stay (HOSPITAL_COMMUNITY): Payer: Medicare Other | Admitting: Physical Therapy

## 2019-07-28 LAB — RENAL FUNCTION PANEL
Albumin: 2.5 g/dL — ABNORMAL LOW (ref 3.5–5.0)
Anion gap: 12 (ref 5–15)
BUN: 60 mg/dL — ABNORMAL HIGH (ref 6–20)
CO2: 26 mmol/L (ref 22–32)
Calcium: 8.7 mg/dL — ABNORMAL LOW (ref 8.9–10.3)
Chloride: 96 mmol/L — ABNORMAL LOW (ref 98–111)
Creatinine, Ser: 4.97 mg/dL — ABNORMAL HIGH (ref 0.61–1.24)
GFR calc Af Amer: 15 mL/min — ABNORMAL LOW (ref >60)
GFR calc non Af Amer: 13 mL/min — ABNORMAL LOW (ref >60)
Glucose, Bld: 141 mg/dL — ABNORMAL HIGH (ref 70–99)
Phosphorus: 4.9 mg/dL — ABNORMAL HIGH (ref 2.5–4.6)
Potassium: 3.9 mmol/L (ref 3.5–5.1)
Sodium: 134 mmol/L — ABNORMAL LOW (ref 135–145)

## 2019-07-28 LAB — GLUCOSE, CAPILLARY
Glucose-Capillary: 145 mg/dL — ABNORMAL HIGH (ref 70–99)
Glucose-Capillary: 125 mg/dL — ABNORMAL HIGH (ref 70–99)
Glucose-Capillary: 179 mg/dL — ABNORMAL HIGH (ref 70–99)
Glucose-Capillary: 276 mg/dL — ABNORMAL HIGH (ref 70–99)

## 2019-07-28 MED ORDER — FUROSEMIDE 40 MG PO TABS: 80.0000 mg | ORAL_TABLET | Freq: Two times a day (BID) | ORAL | Status: DC

## 2019-07-28 MED ORDER — DARBEPOETIN ALFA 60 MCG/0.3ML IJ SOSY
60.0000 ug | PREFILLED_SYRINGE | INTRAMUSCULAR | Status: DC
  Filled 2019-07-28: qty 0.3

## 2019-07-28 NOTE — Progress Notes (Addendum)
Physical Therapy Session Note  Patient Details  Name: Kelly Key MRN: [id number] Date of Birth: 02/21/1971  Today's Date: 07/28/2019 PT Individual Time: [id number] PT Individual Time Calculation (min): 54 min   Short Term Goals: Week 2:  PT Short Term Goal 1 (Week 2): Pt will perform transfers with CGA PT Short Term Goal 2 (Week 2): Pt will ambulate 50 ft with CGA and LRAD PT Short Term Goal 3 (Week 2): Pt will perform sit<>stands consistently with CGA  Skilled Therapeutic Interventions/Progress Updates:    Pt received sitting in w/c and agreeable to therapy session. Sit<>stands using RW throughout session with CGA/close supervision for safety. Ambulated 123ft, 39ft (seated break between) using RW with CGA for safety and 2nd person with w/c follow in event of pt fatigue - pt demonstrates heavy reliance on B UE support via RW, forward trunk flexion and downward gaze, decreased B LE foot clearance with R LE ankle pronation during stance - cuing for improved posture. Performed B LE strength and endurance training using Nustep against level 3 resistance, attempted level 5 resistance but pt unable, for 5 minutes x2 for a total of 238steps and 271steps with pt maintaining steps per minute >45 - therapist utilized music for motivation during Nustep - pt reports Borg RPE 19 despite pt able to talk while performing exercise. Stand pivot Nustep>w/c using RW with CGA for safety. Transported in w/c to main therapy gym for time management. Stand pivot w/c<>mat table using RW with CGA for steadying. Repeated sit<>stands mat<>RW using B UE support with CGA/min assist for steadying/lifting 2sets of 5 with therapist elevating mat table on 2nd set as pt demonstrating heavy compensation from B UEs to perform lifting/lowering despite cuing to increase B LE muscle activation. Sit<>supine on mat table with supervision. Supine bridging 2x12 reps with cuing for increased gluteal muscle activation and supine R LE heel slides x  15 reps as pt demonstrates impaired R LE hip flexor strength throughout session with increased difficulty placing foot on Nustep pedal. Stand pivot back to w/c and transported back to room in w/c for time management. Pt left seated in w/c with needs in reach and chair alarm on.  Addendum: Pt reports feeling as though "someone has pulled the plug" and all of his energy has drained out - continues to report overall lack of energy throughout session.  Therapy Documentation Precautions:  Precautions Precautions: Fall Restrictions Weight Bearing Restrictions: No  Vital Signs: Therapy Vitals BP: 136/80 Patient Position (if appropriate): Sitting   Pain:   Denies pain during session.    Therapy/Group: Individual Therapy  Tawana Scale, PT, DPT 07/28/2019, 8:01 AM

## 2019-07-28 NOTE — Progress Notes (Signed)
Patient ID: Kelly Key, male   DOB: 06/18/1971, 48 y.o.   MRN: [id number]  [facility] Progress Note   Assessment/ Plan:   1.  Generalized weakness/debilitation following hospitalization and acute on chronic renal failure.  Improving acute right knee pain suspected to be from gout with topical Voltaren gel and Medrol taper pack. 2. ESRD: With proteinuric chronic kidney disease stage IV and progression over the recent hospitalizations now to requiring hemodialysis for renal replacement therapy.  Following with daily labs and UOP to assess for renal recovery--labs don't look promising from this AM--plan for HD tomorrow or Saturday based on subsequent labs.  Pursue permanent dialysis access as an outpatient with vascular surgery following discharge if still on HD. 3. Anemia: Hemoglobin/hematocrit low but stable, no overt loss.  Continue ESA. 4. CKD-MBD: Calcium level acceptable with elevated phosphorus, uptitrate sevelamer 2400 mg 3 times daily AC. 5. Nutrition: Continue renal diet, begin renal multivitamin and continue Nepro for protein supplementation. 6. Hypertension: Blood pressure elevated--increased carvedilol yesterday to 25mg  PO BID and will restart lasix if UOP slows down.  Subjective:   Right knee pain better and without chest pain or shortness of breath. Good urine output continues.    Objective:   BP 136/80 (BP Location: Right Arm)   Pulse 79   Temp 97.8 F (36.6 C) (Oral)   Resp 18   Ht 5\' 7"  (1.702 m)   Wt 131 kg   SpO2 100%   BMI 45.23 kg/m   Physical Exam: Gen: Comfortable, participating in PT/OT exercises CVS: Pulse regular rhythm, normal rate, S1 and S2 normal Resp: Clear to auscultation bilaterally, no rales or rhonchi.  Right IJ TDC Abd: Soft, obese, nontender Ext: 1+ pitting bilateral lower extremity edema  Labs: BMET Recent Labs  Lab 07/21/19 1636 07/23/19 1523 07/26/19 1250 07/27/19 0757 07/28/19 0659  NA 130* 129* 131* 134* 134*  K 4.2  3.6 4.2 4.0 3.9  CL 92* 91* 92* 95* 96*  CO2 23 22 22 24 26   GLUCOSE 151* 179* 187* 264* 141*  BUN 67* 58* 71* 40* 60*  CREATININE 6.97* 6.71* 6.54* 4.23* 4.97*  CALCIUM 8.6* 8.6* 9.0 8.7* 8.7*  PHOS 5.7* 6.1* 4.7*  --  4.9*   CBC Recent Labs  Lab 07/21/19 1635 07/23/19 1523 07/26/19 1539  WBC 11.7* 10.4 12.0*  HGB 9.5* 9.5* 9.3*  HCT 29.6* 29.2* 29.6*  MCV 78.3* 78.9* 78.3*  PLT 335 327 404*   Medications:    . allopurinol  200 mg Oral Daily  . amLODipine  5 mg Oral QHS  . aspirin EC  81 mg Oral Daily  . atorvastatin  80 mg Oral Daily  . carvedilol  25 mg Oral BID WC  . Chlorhexidine Gluconate Cloth  6 each Topical Q0600  . cloNIDine  0.1 mg Oral TID  . darbepoetin (ARANESP) injection - DIALYSIS  60 mcg Intravenous Q Thu-HD  . diclofenac sodium  2 g Topical QID  . ezetimibe  10 mg Oral QPM  . fluticasone  1 spray Each Nare Daily  . gabapentin  100 mg Oral TID  . heparin  5,000 Units Subcutaneous Q8H  . insulin aspart  0-20 Units Subcutaneous TID WC  . insulin aspart  0-5 Units Subcutaneous QHS  . insulin aspart  3 Units Subcutaneous TID WC  . insulin detemir  40 Units Subcutaneous BID  . linaclotide  145 mcg Oral QAC breakfast  . methylPREDNISolone  4 mg Oral 4X daily taper  .  multivitamin  1 tablet Oral QHS  . sevelamer carbonate  2,400 mg Oral TID WC   Elmarie Shiley, MD 07/28/2019, 10:16 AM

## 2019-07-28 NOTE — Progress Notes (Signed)
Patients Bp is increased this morning called on call physician.Pam advised me to give the carvedilol and clonidine now and recheck bp in an hour. Patient is A&O will continue to monitor.

## 2019-07-28 NOTE — Progress Notes (Signed)
Occupational Therapy Session Note  Patient Details  Name: Kelly Key MRN: [id number] Date of Birth: Dec 05, 1970  Today's Date: 07/28/2019 OT Individual Time: 0802-0919 OT Individual Time Calculation (min): 77 min    Short Term Goals: Week 2:  OT Short Term Goal 1 (Week 2): Pt will transfer to [facility] with min A OT Short Term Goal 2 (Week 2): Pt will complete sit <> stand with min A OT Short Term Goal 3 (Week 2): Pt will don pants with min A OT Short Term Goal 4 (Week 2): Pt will demonstrate increased endurance and stand for 1 grooming activity with min A  Skilled Therapeutic Interventions/Progress Updates:    Pt noted coming out of the bathroom ambulating without assistance and without assistive device when therapist entered the room.  Provided walker and min assist for pt to ambulate to the wheelchair.  Instructed pt that he was not to get up without assistance, but he stated it had been over 20 mins that he called to go to the bathroom and no one had showed up.  Pt with decreased anticipatory awareness of possible falling if he tries to get up without assistance.  Nursing made aware of this as well as pt evidently turned his bed alarm off to complete the task.  Next had pt work on shaving and oral hygiene at the sink with modified independence.  He then ambulated into the bathroom for work on washing his lower body in the shower with min guard assist.  He was able to complete shower in sitting with lateral leans and supervision.  He then transferred out to the wheelchair for dressing tasks.  Min assist for LB dressing including donning shoes with setup for donning pullover shirt.  Pt left sitting up in the wheelchair with call button and phone in reach as well as safety alarm pad in place.     Therapy Documentation Precautions:  Precautions Precautions: Fall Restrictions Weight Bearing Restrictions: No   Pain: Pain Assessment Pain Scale: Faces Pain Score: 0-No pain ADL: See Care Tool  Section for some details of ADL tasks.  Therapy/Group: Individual Therapy  Ilianna Bown OTR/L 07/28/2019, 12:23 PM

## 2019-07-28 NOTE — Progress Notes (Signed)
[place] PHYSICAL MEDICINE & REHABILITATION PROGRESS NOTE  Subjective/Complaints:  Discussed BP, per pt off HD for now  ROS: Patient denies, nausea, vomiting, diarrhea, cough, shortness of breath or chest pain,     Objective: Vital Signs: Blood pressure 136/80, pulse 79, temperature 97.8 F (36.6 C), temperature source Oral, resp. rate 18, height 5\' 7"  (1.702 m), weight 131 kg, SpO2 100 %. No results found. Recent Labs    07/26/19 1539  WBC 12.0*  HGB 9.3*  HCT 29.6*  PLT 404*   Recent Labs    07/27/19 0757 07/28/19 0659  NA 134* 134*  K 4.0 3.9  CL 95* 96*  CO2 24 26  GLUCOSE 264* 141*  BUN 40* 60*  CREATININE 4.23* 4.97*  CALCIUM 8.7* 8.7*    Physical Exam: BP 136/80 (BP Location: Right Arm)   Pulse 79   Temp 97.8 F (36.6 C) (Oral)   Resp 18   Ht 5\' 7"  (1.702 m)   Wt 131 kg   SpO2 100%   BMI 45.23 kg/m  Constitutional: No distress . Vital signs reviewed. HEENT: EOMI, oral membranes moist Neck: supple Cardiovascular: RRR without murmur. No JVD    Respiratory: CTA Bilaterally without wheezes or rales. Normal effort    GI: BS +, non-tender, non-distended   Skin: Warm and dry.  Intact. Psych: flat but cooperative Musc: Right wrist with minimal radial tenderness today Right knee tender, swollen, warm, medial jt line tenderness Neurological: Alert. Bilateral upper extremities: 4/5 proximal to distal,   Bilateral lower extremities: 4--4/5 proximal to distal,   Skin: Vascular changes bilateral lower extremities   Assessment/Plan: 1. Functional deficits secondary to debility which require 3+ hours per day of interdisciplinary therapy in a comprehensive inpatient rehab setting.  Physiatrist is providing close team supervision and 24 hour management of active medical problems listed below.  Physiatrist and rehab team continue to assess barriers to discharge/monitor patient progress toward functional and medical goals  Care Tool:  Bathing  Bathing  activity did not occur: Safety/medical concerns Body parts bathed by patient: Right arm, Left arm, Chest, Abdomen, Front perineal area, Buttocks, Right upper leg, Left upper leg, Right lower leg, Left lower leg, Face   Body parts bathed by helper: Right arm, Left arm, Face(Did not attempt other parts) Body parts n/a: Buttocks, Right arm, Left arm, Chest, Abdomen   Bathing assist Assist Level: Supervision/Verbal cueing     Upper Body Dressing/Undressing Upper body dressing   What is the patient wearing?: Pull over shirt    Upper body assist Assist Level: Set up assist    Lower Body Dressing/Undressing Lower body dressing    Lower body dressing activity did not occur: N/A What is the patient wearing?: Pants, Underwear/pull up     Lower body assist Assist for lower body dressing: Contact Guard/Touching assist     Toileting Toileting Toileting Activity did not occur (Clothing management and hygiene only): N/A (no void or bm)  Toileting assist Assist for toileting: Moderate Assistance - Patient 50 - 74%     Transfers Chair/bed transfer  Transfers assist     Chair/bed transfer assist level: Contact Guard/Touching assist     Locomotion Ambulation   Ambulation assist      Assist level: Contact Guard/Touching assist Assistive device: Walker-rolling Max distance: 85 ft   Walk 10 feet activity   Assist  Walk 10 feet activity did not occur: Safety/medical concerns  Assist level: Contact Guard/Touching assist Assistive device: Walker-rolling   Walk 50 feet  activity   Assist Walk 50 feet with 2 turns activity did not occur: Safety/medical concerns  Assist level: Contact Guard/Touching assist Assistive device: Walker-rolling    Walk 150 feet activity   Assist Walk 150 feet activity did not occur: Safety/medical concerns  Assist level: Contact Guard/Touching assist Assistive device: Walker-rolling    Walk 10 feet on uneven surface  activity   Assist  Walk 10 feet on uneven surfaces activity did not occur: Safety/medical concerns         Wheelchair     Assist Will patient use wheelchair at discharge?: No Type of Wheelchair: Manual    Wheelchair assist level: Supervision/Verbal cueing Max wheelchair distance: 100 ft    Wheelchair 50 feet with 2 turns activity    Assist        Assist Level: Supervision/Verbal cueing   Wheelchair 150 feet activity     Assist            Medical Problem List and Plan: 1.  Generalized weakness with limitations in self-care secondary to debility- acute on chronic renal failure   Cont CIR PT,OT,  SLP   2.  Antithrombotics: -DVT/anticoagulation:  Pharmaceutical: Heparin             -antiplatelet therapy: On ASA 81 mg daily 3. Pain Management: On gabapentin 3 times daily for neuropathy with hydrocodone on PRN basis             Voltaren gel ordered for right wrist on 9/22--improved  Right knee pain gout medrol ordered  4. Mood: LCSW to follow for evaluation and support             -antipsychotic agents: N/A 5. Neuropsych: This patient is capable of making decisions on his own behalf. 6. Skin/Wound Care: Routine pressure relief measures 7. Fluids/Electrolytes/Nutrition: CM/renal diet with 1200 cc FR.  8.  T2DM with hyperglycemia: Hemoglobin A1c-12.4 and poorly controlled.  Continue Levemir twice daily and use SSI for tighter blood sugar control.  Monitor blood sugars AC/HS.   Levemir increased to 39 twice daily on 9/24  -added meal time scheduled covg 3u tid 9/26  Labile still. Observe for pattern today              . CBG (last 3)  Recent Labs    07/27/19 1715 07/27/19 2123 07/28/19 0625  GLUCAP 194* 345* 145*  may increase to 40U levemir BID-am CBG ok  9.  Hypertension: Monitor BP TID--follow-up with [name] for work-up to rule out RAS. Continue amlodipine, Coreg twice daily, clonidine TID    Furosemide decreased to daily 20 on 9/22   Vitals:   07/28/19 0503  07/28/19 0800  BP: (!) 185/108 136/80  Pulse: 79   Resp: 18   Temp: 97.8 F (36.6 C)   SpO2: 100%   may need to increase amlodipine but improved this am  10.  Anemia of chronic disease: Received IV iron             Labs with HD  Hemoglobin 9.3 on 9/29 11.  ESRD/chronic combined CHF: Anasarca multifactorial and improving with hemodialysis.             Daily weights  Discussed with nephrology on 9/23, plan for fistula placement post discharge Filed Weights   07/26/19 1511 07/26/19 1927 07/28/19 0500  Weight: 135.5 kg 131.9 kg 131 kg   Stable on 10/1 12.  Right knee pain- gout     Continue Voltaren gel   Brace  Much improved on medrol dosepack 13.  Leukocytosis  WBCs 10.4 on 9/26  Resolved   LOS: 12 days A FACE TO [place] E Kirsteins 07/28/2019, 9:04 AM

## 2019-07-28 NOTE — Progress Notes (Signed)
Occupational Therapy Session Note  Patient Details  Name: Kelly Key MRN: [id number] Date of Birth: 05-09-1971  Today's Date: 07/28/2019 OT Individual Time: 1100-1155 OT Individual Time Calculation (min): 55 min    Short Term Goals: Week 2:  OT Short Term Goal 1 (Week 2): Pt will transfer to [facility] with min A OT Short Term Goal 2 (Week 2): Pt will complete sit <> stand with min A OT Short Term Goal 3 (Week 2): Pt will don pants with min A OT Short Term Goal 4 (Week 2): Pt will demonstrate increased endurance and stand for 1 grooming activity with min A  Skilled Therapeutic Interventions/Progress Updates:    Upon entering the room, pt seated in wheelchair with no c/o pain and requesting assistance to remove shoes and don non slip socks. OT encouraged pt to try and he was able to bend forward to unfasten shoes and pull off with increased time. Pt transferred onto EOB and in circle sitting position able to don B socks with increased time as well. Pt engaged in 3 sets of 10 seated knee extension and hip flexion exercises for B LEs with rest breaks as needed secondary to fatigue. Pt engaged in B UE strengthening exercises as well with level 3 theraband for lateral pull downs 3 sets of 10. Pt verbalized he ambulated to bathroom on his own this morning without use of RW or staff. OT educated pt on safety concerns with this task. Pt asking if he could try to walk into bathroom with this therapist in same manner. Pt ambulating 10' into bathroom without AD and min guard but when coming back to wheelchair L knee buckled and needing mod A for safety. Pt continues to have limited insight into why this is a safety concern for home. Pt seated in wheelchair with call bell and all needed items within reach upon exiting the room.   Therapy Documentation Precautions:  Precautions Precautions: Fall Restrictions Weight Bearing Restrictions: No   Therapy/Group: Individual Therapy  Gypsy Decant 07/28/2019,  12:48 PM

## 2019-07-29 ENCOUNTER — Inpatient Hospital Stay (HOSPITAL_COMMUNITY): Payer: Medicare Other | Admitting: Occupational Therapy

## 2019-07-29 ENCOUNTER — Inpatient Hospital Stay (HOSPITAL_COMMUNITY): Payer: Medicare Other | Admitting: Physical Therapy

## 2019-07-29 ENCOUNTER — Inpatient Hospital Stay (HOSPITAL_COMMUNITY): Payer: Medicare Other

## 2019-07-29 ENCOUNTER — Inpatient Hospital Stay (HOSPITAL_COMMUNITY): Payer: Medicare Other | Admitting: *Deleted

## 2019-07-29 LAB — RENAL FUNCTION PANEL
Albumin: 2.7 g/dL — ABNORMAL LOW (ref 3.5–5.0)
Anion gap: 15 (ref 5–15)
BUN: 71 mg/dL — ABNORMAL HIGH (ref 6–20)
CO2: 27 mmol/L (ref 22–32)
Calcium: 8.7 mg/dL — ABNORMAL LOW (ref 8.9–10.3)
Chloride: 94 mmol/L — ABNORMAL LOW (ref 98–111)
Creatinine, Ser: 5.35 mg/dL — ABNORMAL HIGH (ref 0.61–1.24)
GFR calc Af Amer: 13 mL/min — ABNORMAL LOW (ref >60)
GFR calc non Af Amer: 12 mL/min — ABNORMAL LOW (ref >60)
Glucose, Bld: 97 mg/dL (ref 70–99)
Phosphorus: 5 mg/dL — ABNORMAL HIGH (ref 2.5–4.6)
Potassium: 3.9 mmol/L (ref 3.5–5.1)
Sodium: 136 mmol/L (ref 135–145)

## 2019-07-29 LAB — GLUCOSE, CAPILLARY
Glucose-Capillary: 86 mg/dL (ref 70–99)
Glucose-Capillary: 136 mg/dL — ABNORMAL HIGH (ref 70–99)
Glucose-Capillary: 114 mg/dL — ABNORMAL HIGH (ref 70–99)
Glucose-Capillary: 197 mg/dL — ABNORMAL HIGH (ref 70–99)

## 2019-07-29 NOTE — Progress Notes (Signed)
Social Work Patient ID: Kelly Key, male   DOB: [birth year], 48 y.o.   MRN: [id number]  Met with pt following team conference.  Pleased with his progress and feels ready for targeted d/c date of 10/7.  No concerns.  Leomia Blake, LCSW

## 2019-07-29 NOTE — Progress Notes (Signed)
Occupational Therapy Weekly Progress Note  Patient Details  Name: Kelly Key MRN: [id number] Date of Birth: 09/18/1971  Beginning of progress report period: July 22, 2019 End of progress report period: July 29, 2019  Today's Date: 07/29/2019 OT Individual Time: 0730-0815 OT Individual Time Calculation (min): 45 min    Patient has met 3 of 4 short term goals.  Pt is making steady progress towards goals.  Pt currently min assist to CGA with mobility with RW, however due to pain in knee pt is limited in mobility and endurance.  Pt demonstrates decreased safety awareness as he has ambulated to bathroom without AD and no staff present for assistance with no awareness of high fall risk.  Pt is able to complete UB bathing and dressing with setup assist and LB bathing with lateral leans and LB dressing with CGA at sit > stand level.  Pt progress is limited by fluctuating BP and BLE weakness.   Patient continues to demonstrate the following deficits: muscle weakness and decreased standing balance and decreased balance strategies and therefore will continue to benefit from skilled OT intervention to enhance overall performance with BADL and Reduce care partner burden.  Patient progressing toward long term goals..  Continue plan of care.  OT Short Term Goals Week 2:  OT Short Term Goal 1 (Week 2): Pt will transfer to [facility] with min A OT Short Term Goal 1 - Progress (Week 2): Met OT Short Term Goal 2 (Week 2): Pt will complete sit <> stand with min A OT Short Term Goal 2 - Progress (Week 2): Met OT Short Term Goal 3 (Week 2): Pt will don pants with min A OT Short Term Goal 3 - Progress (Week 2): Met OT Short Term Goal 4 (Week 2): Pt will demonstrate increased endurance and stand for 1 grooming activity with min A OT Short Term Goal 4 - Progress (Week 2): Progressing toward goal Week 3:  OT Short Term Goal 1 (Week 3): Pt will demonstrate increased endurance and stand for 1 grooming activity with  min A OT Short Term Goal 2 (Week 3): STG = LTGs due to remaining LOS  Skilled Therapeutic Interventions/Progress Updates:    Treatment session with focus on functional transfers and safety with mobility during self-care tasks.  Pt received upright in w/c agreeable to bathing/dressing this session.  Pt ambulated to room shower with RW and min assist - reporting weakness and stiffness with ambulation.  Pt completed LB bathing with lateral leans on tub bench.  UB bathing and dressing completed at sink due to HD catheter.  Completed stand pivot transfer out of shower to w/c due to BLE weakness.  Pt completed grooming tasks seated at sink.  Stand pivot transfer w/c > bed. Pt completed LB dressing at sit > stand level from EOB with CGA for standing balance.  Pt returned to supine in bed to rest until next therapy session.  Therapy Documentation Precautions:  Precautions Precautions: Fall Restrictions Weight Bearing Restrictions: No Pain:  Pt with no c/o pain   Therapy/Group: Individual Therapy  HOXIE, SARAH 07/29/2019, 10:02 AM  

## 2019-07-29 NOTE — Progress Notes (Signed)
[place] PHYSICAL MEDICINE & REHABILITATION PROGRESS NOTE  Subjective/Complaints:  Working on sup-> sit with PT, did Mod I Discussed renal fxn , creat   ROS: Patient denies, nausea, vomiting, diarrhea, cough, shortness of breath or chest pain,     Objective: Vital Signs: Blood pressure (!) 138/102, pulse 80, temperature 97.7 F (36.5 C), temperature source Oral, resp. rate 14, height 5\' 7"  (1.702 m), weight 132.1 kg, SpO2 95 %. No results found. Recent Labs    07/26/19 1539  WBC 12.0*  HGB 9.3*  HCT 29.6*  PLT 404*   Recent Labs    07/28/19 0659 07/29/19 0639  NA 134* 136  K 3.9 3.9  CL 96* 94*  CO2 26 27  GLUCOSE 141* 97  BUN 60* 71*  CREATININE 4.97* 5.35*  CALCIUM 8.7* 8.7*    Physical Exam: BP (!) 138/102 (BP Location: Right Arm)   Pulse 80   Temp 97.7 F (36.5 C) (Oral)   Resp 14   Ht 5\' 7"  (1.702 m)   Wt 132.1 kg   SpO2 95%   BMI 45.61 kg/m  Constitutional: No distress . Vital signs reviewed. HEENT: EOMI, oral membranes moist Neck: supple Cardiovascular: RRR without murmur. No JVD    Respiratory: CTA Bilaterally without wheezes or rales. Normal effort    GI: BS +, non-tender, non-distended   Skin: Warm and dry.  Intact. Psych: flat but cooperative Musc: Right wrist with minimal radial tenderness today Right knee tender, swollen, warm, medial jt line tenderness Neurological: Alert. Bilateral upper extremities: 4/5 proximal to distal,   Bilateral lower extremities: 4--4/5 proximal to distal,   Skin: Vascular changes bilateral lower extremities   Assessment/Plan: 1. Functional deficits secondary to debility which require 3+ hours per day of interdisciplinary therapy in a comprehensive inpatient rehab setting.  Physiatrist is providing close team supervision and 24 hour management of active medical problems listed below.  Physiatrist and rehab team continue to assess barriers to discharge/monitor patient progress toward functional and medical  goals  Care Tool:  Bathing  Bathing activity did not occur: Safety/medical concerns Body parts bathed by patient: Right arm, Left arm, Chest, Abdomen, Front perineal area, Buttocks, Right upper leg, Left upper leg, Right lower leg, Left lower leg, Face   Body parts bathed by helper: Right arm, Left arm, Face(Did not attempt other parts) Body parts n/a: Buttocks, Right arm, Left arm, Chest, Abdomen   Bathing assist Assist Level: Contact Guard/Touching assist     Upper Body Dressing/Undressing Upper body dressing   What is the patient wearing?: Pull over shirt    Upper body assist Assist Level: Set up assist    Lower Body Dressing/Undressing Lower body dressing    Lower body dressing activity did not occur: N/A What is the patient wearing?: Underwear/pull up, Pants     Lower body assist Assist for lower body dressing: Contact Guard/Touching assist     Toileting Toileting Toileting Activity did not occur (Clothing management and hygiene only): N/A (no void or bm)  Toileting assist Assist for toileting: Moderate Assistance - Patient 50 - 74%     Transfers Chair/bed transfer  Transfers assist     Chair/bed transfer assist level: Contact Guard/Touching assist     Locomotion Ambulation   Ambulation assist      Assist level: Moderate Assistance - Patient 50 - 74% Assistive device: No Device Max distance: 20'   Walk 10 feet activity   Assist  Walk 10 feet activity did not occur: Safety/medical concerns  Assist level: Moderate Assistance - Patient - 50 - 74% Assistive device: No Device   Walk 50 feet activity   Assist Walk 50 feet with 2 turns activity did not occur: Safety/medical concerns  Assist level: Contact Guard/Touching assist Assistive device: Walker-rolling    Walk 150 feet activity   Assist Walk 150 feet activity did not occur: Safety/medical concerns  Assist level: Contact Guard/Touching assist Assistive device: Walker-rolling     Walk 10 feet on uneven surface  activity   Assist Walk 10 feet on uneven surfaces activity did not occur: Safety/medical concerns         Wheelchair     Assist Will patient use wheelchair at discharge?: No Type of Wheelchair: Manual    Wheelchair assist level: Supervision/Verbal cueing Max wheelchair distance: 100 ft    Wheelchair 50 feet with 2 turns activity    Assist        Assist Level: Supervision/Verbal cueing   Wheelchair 150 feet activity     Assist            Medical Problem List and Plan: 1.  Generalized weakness with limitations in self-care secondary to debility- acute on chronic renal failure   Cont CIR PT,OT,  SLP   2.  Antithrombotics: -DVT/anticoagulation:  Pharmaceutical: Heparin             -antiplatelet therapy: On ASA 81 mg daily 3. Pain Management: On gabapentin 3 times daily for neuropathy with hydrocodone on PRN basis             Voltaren gel ordered for right wrist on 9/22--improved  Right knee pain gout medrol ordered  4. Mood: LCSW to follow for evaluation and support             -antipsychotic agents: N/A 5. Neuropsych: This patient is capable of making decisions on his own behalf. 6. Skin/Wound Care: Routine pressure relief measures 7. Fluids/Electrolytes/Nutrition: CM/renal diet with 1200 cc FR.  8.  T2DM with hyperglycemia: Hemoglobin A1c-12.4 and poorly controlled.  Continue Levemir twice daily and use SSI for tighter blood sugar control.  Monitor blood sugars AC/HS.   Levemir increased to 39 twice daily on 9/24  -added meal time scheduled covg 3u tid 9/26  Labile still. Observe for pattern today              . CBG (last 3)  Recent Labs    07/28/19 1614 07/28/19 2129 07/29/19 0624  GLUCAP 179* 276* 86  may increase to 40U levemir BID-am CBG ok , will increase mealtime coverage  9.  Hypertension: Monitor BP TID--follow-up with [name] for work-up to rule out RAS. Continue amlodipine, Coreg twice daily,  clonidine TID    Furosemide decreased to daily 20 on 9/22   Vitals:   07/28/19 1951 07/29/19 0413  BP: (!) 171/98 (!) 138/102  Pulse: 72 80  Resp:  14  Temp:  97.7 F (36.5 C)  SpO2:  95%   need to increase amlodipine on 10/3     10.  Anemia of chronic disease: Received IV iron             Labs with HD  Hemoglobin 9.3 on 9/29 11.  ESRD/chronic combined CHF: Anasarca multifactorial and improving with hemodialysis.             Daily weights  Filed Weights   07/26/19 1927 07/28/19 0500 07/29/19 0413  Weight: 131.9 kg 131 kg 132.1 kg   Stable on 10/1 12.  Right knee  pain- gout     Continue Voltaren gel   Brace    Much improved on medrol dosepack 13.  Leukocytosis  WBCs 10.4 on 9/26  Resolved   LOS: 13 days A FACE TO [place] E Ceriah Kohler 07/29/2019, 8:56 AM

## 2019-07-29 NOTE — Progress Notes (Signed)
Occupational Therapy Session Note  Patient Details  Name: Kelly Key MRN: [id number] Date of Birth: 1970/12/23  Today's Date: 07/29/2019 OT Individual Time: 1300-1341 OT Individual Time Calculation (min): 41 min   Skilled Therapeutic Interventions/Progress Updates:    Pt greeted in the w/c, reporting min pain but manageable for tx without medicinal interventions. ADL needs met. Therefore tx focus was placed on strengthening and endurance. Guided pt through w/c push ups, holding lifted position for 10 second holds, x10 reps. He was able to clear buttocks and achieve partial stand. Emphasized that this is an exercise that he can do at home to maintain his physical health. He reports he already owns some equipment, like exercise bands and weights, that he plans to use post d/c. During rest breaks, had pt perform gentle UB stretches for shoulders and neck. Discussed balancing strengthening and stretching to prevent muscular injury. Pt also expressed that he is facing some stress with planning for d/c and his transition home. Provided him with lavender to use via inhalation to promote calmness and relaxation at this time for stress mgt. He was appreciative. Pt was left in care of RN at session exit.   Therapy Documentation Precautions:  Precautions Precautions: Fall Restrictions Weight Bearing Restrictions: No Vital Signs: Therapy Vitals Temp: 97.9 F (36.6 C) Temp Source: Oral Pulse Rate: 83 Resp: 18 BP: (!) 143/99 Patient Position (if appropriate): Sitting Oxygen Therapy SpO2: 100 % O2 Device: Room Air ADL:       Therapy/Group: Individual Therapy  [name] A Suleika Donavan 07/29/2019, 3:59 PM

## 2019-07-29 NOTE — Progress Notes (Signed)
Physical Therapy Session Note  Patient Details  Name: Kelly Key MRN: [id number] Date of Birth: 26-Aug-1971  Today's Date: 07/29/2019 PT Individual Time: [id number] PT Individual Time Calculation (min): 60 min   Short Term Goals: Week 2:  PT Short Term Goal 1 (Week 2): Pt will perform transfers with CGA PT Short Term Goal 2 (Week 2): Pt will ambulate 50 ft with CGA and LRAD PT Short Term Goal 3 (Week 2): Pt will perform sit<>stands consistently with CGA  Skilled Therapeutic Interventions/Progress Updates:     Patient in bed upon PT arrival. Patient alert and agreeable to PT session. Patient reported 2/10 B LE soreness pain during session. PT provided repositioning, rest breaks, and distraction as pain interventions throughout session. Patient informed therapist that his blood glucose was low this morning, but he had some ghram crackers prior to session. Stated he still felt slightly light headed, but decline eating anything else. He also stated that he was very excited to not need dialysis anymore, but wanted to avoid needing it again in the future. PT discussed general strategies to promote healthy choices to maintain a healthier lifestyle with his many comorbidities throughout session. Educated on general nutritional choices, including making cooking at home fun to control intake better, including exercise in daily activities, promoting water intake and measure water intake throughout the day, following up with medical appointments, performing daily foot checks and discussing the benefits of having a podiatrist to assess feet and foot wear with his PCP.   Therapeutic Activity: Bed Mobility: Patient performed supine to/from sit independently in a flat bed without use of bed rails. Patient sat EOB x10 min and was examined by Dr. Ella Bodo during rounds and discussed renal function.  Transfers: Patient performed sit to/from stand x1 from the bed with CGA and x1 from the rollator with min A. PT  demonstrated use of Rollator including quick breaks and setting the breaks and transferring from standing to sitting on the rollator. Patient replicated use of breaks and transfer with min cues during session.  Gait Training:  Patient ambulated 10 feet using Rollator with CGA, reported increased LE weakness . Ambulated with wide BOS, increased trunk flexion, decreased gait speed, and decreased step length/height. Provided verbal cues for erect posture and increased step height for improved foot clearance.  Patient in bed at end of session with breaks locked, bed alarm set, and all needs within reach.    Therapy Documentation Precautions:  Precautions Precautions: Fall Restrictions Weight Bearing Restrictions: No Vital Signs: BP: 140/90 HR: 74 SPO2: 100% Position: Lying   Therapy/Group: Individual Therapy  Srihith Aquilino L Berdella Bacot PT, DPT  07/29/2019, 3:59 PM

## 2019-07-29 NOTE — Progress Notes (Signed)
Patient ID: Kelly Key, male   DOB: [birth year], 48 y.o.   MRN: [id number]  [facility] Progress Note   Assessment/ Plan:   1.  Generalized weakness/debilitation following hospitalization and acute on chronic renal failure.  Improving acute right knee pain suspected to be from gout with topical Voltaren gel and Medrol taper pack. 2. ESRD: With proteinuric chronic kidney disease stage IV and progression over the recent hospitalizations now to requiring hemodialysis for renal replacement therapy.  Excellent UOP but worsening azotemia-no clear indication for renal recovery and I plan for HD tomorrow (Saturday) for clearance  With minimal UF.  Pursue permanent dialysis access as an outpatient with vascular surgery following discharge. 3. Anemia: Hemoglobin/hematocrit low but stable, no overt loss.  Continue ESA. 4. CKD-MBD: Calcium level acceptable with elevated phosphorus, uptitrate sevelamer 2400 mg 3 times daily AC. 5. Nutrition: Continue renal diet, begin renal multivitamin and continue Nepro for protein supplementation. 6. Hypertension: Blood pressure elevated--increased carvedilol yesterday to 25mg  PO BID and will restart lasix if UOP slows down.  Subjective:   Reports to be feeling better and denies any chest pain or shortness of breath.    Objective:   BP (!) 138/102 (BP Location: Right Arm)   Pulse 80   Temp 97.7 F (36.5 C) (Oral)   Resp 14   Ht 5\' 7"  (1.702 m)   Wt 132.1 kg   SpO2 95%   BMI 45.61 kg/m   Physical Exam: Gen: Comfortable, sitting up in wheelchair CVS: Pulse regular rhythm, normal rate, S1 and S2 normal Resp: Clear to auscultation bilaterally, no rales or rhonchi.  Right IJ TDC Abd: Soft, obese, nontender Ext: 1+ pitting bilateral lower extremity edema  Labs: BMET Recent Labs  Lab 07/23/19 1523 07/26/19 1250 07/27/19 0757 07/28/19 0659 07/29/19 0639  NA 129* 131* 134* 134* 136  K 3.6 4.2 4.0 3.9 3.9  CL 91* 92* 95* 96* 94*  CO2 22 22 24  26 27   GLUCOSE 179* 187* 264* 141* 97  BUN 58* 71* 40* 60* 71*  CREATININE 6.71* 6.54* 4.23* 4.97* 5.35*  CALCIUM 8.6* 9.0 8.7* 8.7* 8.7*  PHOS 6.1* 4.7*  --  4.9* 5.0*   CBC Recent Labs  Lab 07/23/19 1523 07/26/19 1539  WBC 10.4 12.0*  HGB 9.5* 9.3*  HCT 29.2* 29.6*  MCV 78.9* 78.3*  PLT 327 404*   Medications:    . allopurinol  200 mg Oral Daily  . amLODipine  5 mg Oral QHS  . aspirin EC  81 mg Oral Daily  . atorvastatin  80 mg Oral Daily  . carvedilol  25 mg Oral BID WC  . Chlorhexidine Gluconate Cloth  6 each Topical Q0600  . cloNIDine  0.1 mg Oral TID  . darbepoetin (ARANESP) injection - DIALYSIS  60 mcg Intravenous Q Fri-HD  . diclofenac sodium  2 g Topical QID  . ezetimibe  10 mg Oral QPM  . fluticasone  1 spray Each Nare Daily  . gabapentin  100 mg Oral TID  . heparin  5,000 Units Subcutaneous Q8H  . insulin aspart  0-20 Units Subcutaneous TID WC  . insulin aspart  0-5 Units Subcutaneous QHS  . insulin aspart  3 Units Subcutaneous TID WC  . insulin detemir  40 Units Subcutaneous BID  . linaclotide  145 mcg Oral QAC breakfast  . methylPREDNISolone  4 mg Oral 4X daily taper  . multivitamin  1 tablet Oral QHS  . sevelamer carbonate  2,400 mg Oral TID  WC   Elmarie Shiley, MD 07/29/2019, 11:22 AM

## 2019-07-29 NOTE — Patient Care Conference (Signed)
Inpatient RehabilitationTeam Conference and Plan of Care Update Date: 07/27/2019   Time: 11:05 AM    Patient Name: Kelly Key      Medical Record Number: [id number]  Date of Birth: Jan 27, 1971 Sex: Male         Room/Bed: 4W23C/4W23C-01 Payor Info: Payor: MEDICARE / Plan: MEDICARE PART A AND B / Product Type: *No Product type* /    Admit Date/Time:  07/16/2019  2:01 PM  Primary Diagnosis:  Physical debility  Patient Active Problem List   Diagnosis Date Noted  . Orthostasis   . Leukocytosis   . Muscle strain of right wrist   . Labile blood glucose   . Pain   . Physical debility 07/16/2019  . ESRD on dialysis ([place])   . Anemia of chronic disease   . Uncontrolled type 2 diabetes mellitus with hyperglycemia ([place])   . Congestive heart failure ([place])   . Volume overload 07/05/2019  . Anasarca 07/03/2019  . Dyslipidemia 07/03/2019  . Chronic combined systolic and diastolic CHF (congestive heart failure) ([place]) 07/03/2019  . Bilateral leg edema   . CKD (chronic kidney disease) stage 5, GFR less than 15 ml/min ([facility]) 06/22/2019  . Uncontrolled type 2 diabetes mellitus with hyperglycemia, with long-term current use of insulin ([place]) 06/22/2019  . Hypokalemia   . Thromboembolism ([place]) 02/20/2019  . AKI (acute kidney injury) ([place]) 02/20/2019  . Acute on chronic diastolic CHF (congestive heart failure) ([place]) 03/26/2018  . Acute respiratory failure with hypoxia ([place]) 03/26/2018  . Hyponatremia 06/12/2012  . Microcytic anemia 06/12/2012  . HTN (hypertension) 06/11/2012  . Elevated LFTs 06/11/2012  . Morbid obesity ([place]) 06/11/2012  . Hepatic steatosis 06/11/2012    Expected Discharge Date: Expected Discharge Date: 08/03/19  Team Members Present: Physician leading conference: Dr. Alysia Penna Social Worker Present: Lennart Pall, LCSW Nurse Present: Isla Pence, RN PT Present: Michaelene Song, PT OT Present: Clyda Greener, OT SLP Present: Stormy Fabian, SLP PPS Coordinator present :  Gunnar Fusi, SLP     Current Status/Progress Goal Weekly Team Focus  Bowel/Bladder   HD T/TH/Sat, [facility] 07/25/19  Maintain continence  QS/PRN assessment provide schedulenedications and prn laxative with follow uo   Swallow/Nutrition/ Hydration             ADL's   Supervison for UB selfcare, Min guard assist for LB selfcare sit to stand.  Min guard for transfers.  Supervision overall  selfcare retraining, transfer training, balance retraining,, DME education, pt education, therapeutic exercise   Mobility   CGA for sit<>stand, transfers and gait up to 155 ft with RW. 4 steps min assist with B Rails. heavy reliance on UEs  S overall  endurance, B LE strengthening, gait, education, balance   Communication             Safety/Cognition/ Behavioral Observations            Pain   Verbalize pain to right knee and occassional right wrist are, prn Norco provided with relief  < 2  Assess QS/PRN  with follow up   Skin   No skin issues. HD cath site unremarkable in right upper chest area  Maintain skin integrity while on [facility]       Rehab Goals Patient on target to meet rehab goals: Yes *See Care Plan and progress notes for long and short-term goals.     Barriers to Discharge  Current Status/Progress Possible Resolutions Date Resolved   Nursing  PT                    OT                  SLP                SW                Discharge Planning/Teaching Needs:  Home with fiance and sister to assist with his care. May be 1-2 hours alone.  Teaching needs TBD   Team Discussion:  A&O x4, cont b/b; watching BP.  S - min assist overall with therapies.  SW reports pt will likely be alone a few hours in the home and they feel this is not a safety concern.  Recommending HH f/u  Revisions to Treatment Plan:  NA    Medical Summary Current Status: elevated BP Weekly Focus/Goal: R Knee painimproved now on medrol dosepack  Barriers to Discharge: Medical stability   Possible  Resolutions to Barriers: On HD managed by nephrology   Continued Need for Acute Rehabilitation Level of Care: The patient requires daily medical management by a physician with specialized training in physical medicine and rehabilitation for the following reasons: Direction of a multidisciplinary physical rehabilitation program to maximize functional independence : Yes Medical management of patient stability for increased activity during participation in an intensive rehabilitation regime.: Yes Analysis of laboratory values and/or radiology reports with any subsequent need for medication adjustment and/or medical intervention. : Yes   I attest that I was present, lead the team conference, and concur with the assessment and plan of the team.   Lennart Pall 07/29/2019, 8:26 AM    Team conference was held via web/ teleconference due to [place] - 19

## 2019-07-29 NOTE — Progress Notes (Signed)
Physical Therapy Session Note  Patient Details  Name: Kelly Key MRN: [id number] Date of Birth: [birth year]  Today's Date: 07/29/2019 PT Individual Time: [id number] PT Individual Time Calculation (min): 64 min   Short Term Goals: Week 2:  PT Short Term Goal 1 (Week 2): Pt will perform transfers with CGA PT Short Term Goal 2 (Week 2): Pt will ambulate 50 ft with CGA and LRAD PT Short Term Goal 3 (Week 2): Pt will perform sit<>stands consistently with CGA  Skilled Therapeutic Interventions/Progress Updates:    Pt received sitting in w/c and reporting being tired but agreeable to therapy session.  Transported to/from gym in w/c for energy conservation. Sit<>stands using RW with CGA for steadying throughout session. Ambulated 53ft x2 (seated break between) using RW with CGA for steadying - continues to demonstrate heavy reliance on B UE support with associated anterior trunk lean and downward head gaze despite cuing for improvement. Alternate B LE foot taps on 4" step using B UE support on RW with CGA for steadying - pt demonstrates continued heavy reliance on B UE support and forward trunk flexion despite mirror feedback and cuing for increased upright posture pt unable to maintain upright. Sit<>tall kneeling on mat table with CGA/min assist for steadying/balance using B UE support on bench. Maintained static tall kneeling without UE support for 30 seconds progressed to 30 seconds while holding B UE overhead all with CGA for safety. Stand pivot w/c<>EOM using RW with CGA for steadying. Standing tolerance and B LE strengthening activity of standing without UE support while creating PEG board pattern - CGA for steadying with pt able to stand ~1 minute the first time and then decreasing to ~20 seconds with L HHA before requiring seated rest break due to B LE muscle fatigue and pt's fear of B LE knee buckling. Supine<>sit on mat table with supervision. Performed supine B LE heel slides x 10 reps each and B LE  active assisted straight leg raise with assist to maintain sustained knee extension x8 reps each - multimodal cuing throughout exercises for proper form/technique. Stand pivot back to w/c and pt transported back to room and left sitting in w/c with needs in reach and chair alarm on.    Therapy Documentation Precautions:  Precautions Precautions: Fall Restrictions Weight Bearing Restrictions: No  Pain:   Reports pain in B LEs that pt describes as muscle soreness from where he is weak - provided seated rest breaks as needed and education regarding expectation of muscle soreness after exercises.    Therapy/Group: Individual Therapy  Tawana Scale, PT, DPT 07/29/2019, 12:55 PM

## 2019-07-30 LAB — RENAL FUNCTION PANEL
Albumin: 2.6 g/dL — ABNORMAL LOW (ref 3.5–5.0)
Anion gap: 15 (ref 5–15)
BUN: 81 mg/dL — ABNORMAL HIGH (ref 6–20)
CO2: 22 mmol/L (ref 22–32)
Calcium: 8.7 mg/dL — ABNORMAL LOW (ref 8.9–10.3)
Chloride: 97 mmol/L — ABNORMAL LOW (ref 98–111)
Creatinine, Ser: 5.75 mg/dL — ABNORMAL HIGH (ref 0.61–1.24)
GFR calc Af Amer: 12 mL/min — ABNORMAL LOW (ref >60)
GFR calc non Af Amer: 11 mL/min — ABNORMAL LOW (ref >60)
Glucose, Bld: 112 mg/dL — ABNORMAL HIGH (ref 70–99)
Phosphorus: 5.3 mg/dL — ABNORMAL HIGH (ref 2.5–4.6)
Potassium: 4 mmol/L (ref 3.5–5.1)
Sodium: 134 mmol/L — ABNORMAL LOW (ref 135–145)

## 2019-07-30 LAB — CBC
HCT: 31.5 % — ABNORMAL LOW (ref 39.0–52.0)
Hemoglobin: 9.6 g/dL — ABNORMAL LOW (ref 13.0–17.0)
MCH: 23.9 pg — ABNORMAL LOW (ref 26.0–34.0)
MCHC: 30.5 g/dL (ref 30.0–36.0)
MCV: 78.6 fL — ABNORMAL LOW (ref 80.0–100.0)
Platelets: 367 10*3/uL (ref 150–400)
RBC: 4.01 MIL/uL — ABNORMAL LOW (ref 4.22–5.81)
RDW: 15.5 % (ref 11.5–15.5)
WBC: 8.5 10*3/uL (ref 4.0–10.5)
nRBC: 0 % (ref 0.0–0.2)

## 2019-07-30 LAB — GLUCOSE, CAPILLARY
Glucose-Capillary: 113 mg/dL — ABNORMAL HIGH (ref 70–99)
Glucose-Capillary: 107 mg/dL — ABNORMAL HIGH (ref 70–99)
Glucose-Capillary: 90 mg/dL (ref 70–99)

## 2019-07-30 MED ORDER — HEPARIN SODIUM (PORCINE) 1000 UNIT/ML IJ SOLN
INTRAMUSCULAR | Status: AC
  Filled 2019-07-30: qty 1

## 2019-07-30 MED ORDER — HEPARIN SODIUM (PORCINE) 1000 UNIT/ML DIALYSIS
40.0000 [IU]/kg | INTRAMUSCULAR | Status: DC | PRN
  Administered 2019-07-30: 5300 [IU] via INTRAVENOUS_CENTRAL
  Filled 2019-07-30 (×2): qty 6

## 2019-07-30 MED ORDER — HEPARIN SODIUM (PORCINE) 1000 UNIT/ML IJ SOLN
INTRAMUSCULAR | Status: AC
  Filled 2019-07-30: qty 4

## 2019-07-30 MED ORDER — AMLODIPINE BESYLATE 10 MG PO TABS
10.0000 mg | ORAL_TABLET | Freq: Every day | ORAL | Status: DC
  Administered 2019-07-30 – 2019-07-31 (×2): 10 mg via ORAL
  Filled 2019-07-30 (×2): qty 1

## 2019-07-30 NOTE — Progress Notes (Signed)
[place] PHYSICAL MEDICINE & REHABILITATION PROGRESS NOTE  Subjective/Complaints:  Pt asleep- refused to wake up- waved me away.    ROS: Patient denies, nausea, vomiting, diarrhea, cough, shortness of breath or chest pain,     Objective: Vital Signs: Blood pressure 138/85, pulse 79, temperature 97.6 F (36.4 C), temperature source Oral, resp. rate 18, height 5\' 7"  (1.702 m), weight 129.4 kg, SpO2 100 %. No results found. No results for input(s): WBC, HGB, HCT, PLT in the last 72 hours. Recent Labs    07/29/19 0639 07/30/19 0609  NA 136 134*  K 3.9 4.0  CL 94* 97*  CO2 27 22  GLUCOSE 97 112*  BUN 71* 81*  CREATININE 5.35* 5.75*  CALCIUM 8.7* 8.7*    Physical Exam: BP 138/85 (BP Location: Left Arm)   Pulse 79   Temp 97.6 F (36.4 C) (Oral)   Resp 18   Ht 5\' 7"  (1.702 m)   Wt 129.4 kg   SpO2 100%   BMI 44.68 kg/m  Constitutional: No distress . Vital signs and labs reviewed. Pt asleep- refused to wake up for discussion HEENT: EOMI, oral membranes moist Neck: supple Cardiovascular: RRR without murmur. No JVD    Respiratory: CTA Bilaterally without wheezes or rales. Normal effort    GI: BS +, non-tender, non-distended   Skin: Warm and dry.  Intact. Psych: flat but cooperative Musc: Right wrist with minimal radial tenderness today Right knee tender, swollen, warm, medial jt line tenderness Neurological: Alert. Bilateral upper extremities: 4/5 proximal to distal,   Bilateral lower extremities: 4--4/5 proximal to distal,   Skin: Vascular changes bilateral lower extremities   Assessment/Plan: 1. Functional deficits secondary to debility which require 3+ hours per day of interdisciplinary therapy in a comprehensive inpatient rehab setting.  Physiatrist is providing close team supervision and 24 hour management of active medical problems listed below.  Physiatrist and rehab team continue to assess barriers to discharge/monitor patient progress toward functional and  medical goals  Care Tool:  Bathing  Bathing activity did not occur: Safety/medical concerns Body parts bathed by patient: Right arm, Left arm, Chest, Abdomen, Front perineal area, Buttocks, Right upper leg, Left upper leg, Right lower leg, Left lower leg, Face   Body parts bathed by helper: Right arm, Left arm, Face(Did not attempt other parts) Body parts n/a: Buttocks, Right arm, Left arm, Chest, Abdomen   Bathing assist Assist Level: Contact Guard/Touching assist     Upper Body Dressing/Undressing Upper body dressing   What is the patient wearing?: Pull over shirt    Upper body assist Assist Level: Set up assist    Lower Body Dressing/Undressing Lower body dressing    Lower body dressing activity did not occur: N/A What is the patient wearing?: Underwear/pull up, Pants     Lower body assist Assist for lower body dressing: Contact Guard/Touching assist     Toileting Toileting Toileting Activity did not occur (Clothing management and hygiene only): N/A (no void or bm)  Toileting assist Assist for toileting: Moderate Assistance - Patient 50 - 74%     Transfers Chair/bed transfer  Transfers assist     Chair/bed transfer assist level: Contact Guard/Touching assist Chair/bed transfer assistive device: Programmer, multimedia   Ambulation assist      Assist level: Contact Guard/Touching assist Assistive device: Walker-rolling Max distance: 20ft   Walk 10 feet activity   Assist  Walk 10 feet activity did not occur: Safety/medical concerns  Assist level: Contact Guard/Touching assist  Assistive device: Walker-rolling   Walk 50 feet activity   Assist Walk 50 feet with 2 turns activity did not occur: Safety/medical concerns  Assist level: Contact Guard/Touching assist Assistive device: Walker-rolling    Walk 150 feet activity   Assist Walk 150 feet activity did not occur: Safety/medical concerns  Assist level: Contact Guard/Touching  assist Assistive device: Walker-rolling    Walk 10 feet on uneven surface  activity   Assist Walk 10 feet on uneven surfaces activity did not occur: Safety/medical concerns         Wheelchair     Assist Will patient use wheelchair at discharge?: No Type of Wheelchair: Manual    Wheelchair assist level: Supervision/Verbal cueing Max wheelchair distance: 100 ft    Wheelchair 50 feet with 2 turns activity    Assist        Assist Level: Supervision/Verbal cueing   Wheelchair 150 feet activity     Assist            Medical Problem List and Plan: 1.  Generalized weakness with limitations in self-care secondary to debility- acute on chronic renal failure   Cont CIR PT,OT,  SLP   2.  Antithrombotics: -DVT/anticoagulation:  Pharmaceutical: Heparin             -antiplatelet therapy: On ASA 81 mg daily 3. Pain Management: On gabapentin 3 times daily for neuropathy with hydrocodone on PRN basis             Voltaren gel ordered for right wrist on 9/22--improved  Right knee pain gout medrol ordered  4. Mood: LCSW to follow for evaluation and support             -antipsychotic agents: N/A 5. Neuropsych: This patient is capable of making decisions on his own behalf. 6. Skin/Wound Care: Routine pressure relief measures 7. Fluids/Electrolytes/Nutrition: CM/renal diet with 1200 cc FR.  8.  T2DM with hyperglycemia: Hemoglobin A1c-12.4 and poorly controlled.  Continue Levemir twice daily and use SSI for tighter blood sugar control.  Monitor blood sugars AC/HS.   Levemir increased to 39 twice daily on 9/24  -added meal time scheduled covg 3u tid 9/26  Labile still. Observe for pattern today                . CBG (last 3)  Recent Labs    07/29/19 2055 07/30/19 0638 07/30/19 1220  GLUCAP 197* 113* 107*  may increase to 40U levemir BID-am CBG ok , will increase mealtime coverage  BGs low to high 100s- but improved 9.  Hypertension: Monitor BP TID--follow-up with  [name] for work-up to rule out RAS. Continue amlodipine, Coreg twice daily, clonidine TID    Furosemide decreased to daily 20 on 9/22   Vitals:   07/30/19 0514 07/30/19 1352  BP: (!) 169/111 138/85  Pulse: 88 79  Resp: 16 18  Temp: 97.7 F (36.5 C) 97.6 F (36.4 C)  SpO2: 97% 100%   need to increase amlodipine on 10/3- done    10.  Anemia of chronic disease: Received IV iron             Labs with HD  Hemoglobin 9.3 on 9/29 11.  ESRD/chronic combined CHF: Anasarca multifactorial and improving with hemodialysis.             Daily weights  Filed Weights   07/28/19 0500 07/29/19 0413 07/30/19 0500  Weight: 131 kg 132.1 kg 129.4 kg   Stable on 10/3 12.  Right knee  pain- gout     Continue Voltaren gel   Brace    Much improved on medrol dosepack 13.  Leukocytosis  WBCs 10.4 on 9/26  Resolved   LOS: 14 days A FACE TO FACE EVALUATION WAS PERFORMED  Gearald Stonebraker 07/30/2019, 3:27 PM

## 2019-07-30 NOTE — Plan of Care (Signed)
  Problem: Consults Goal: RH GENERAL PATIENT EDUCATION Description: See Patient Education module for education specifics. Outcome: Progressing Goal: Skin Care Protocol Initiated - if Braden Score 18 or less Description: If consults are not indicated, leave blank or document N/A Outcome: Progressing Goal: Nutrition Consult-if indicated Outcome: Progressing   Problem: RH BOWEL ELIMINATION Goal: RH STG MANAGE BOWEL WITH ASSISTANCE Description: STG Manage Bowel with  mod I Assistance. Outcome: Progressing Goal: RH STG MANAGE BOWEL W/MEDICATION W/ASSISTANCE Description: STG Manage Bowel with Medication with mod I Assistance. Outcome: Progressing   Problem: RH BLADDER ELIMINATION Goal: RH STG MANAGE BLADDER WITH ASSISTANCE Description: STG Manage Bladder With  min Assistance Outcome: Progressing   Problem: RH SAFETY Goal: RH STG ADHERE TO SAFETY PRECAUTIONS W/ASSISTANCE/DEVICE Description: STG Adhere to Safety Precautions With cues/reminders Assistance/Device. Outcome: Progressing Goal: RH STG DECREASED RISK OF FALL WITH ASSISTANCE Description: STG Decreased Risk of Fall With min Assistance. Outcome: Progressing   Problem: RH PAIN MANAGEMENT Goal: RH STG PAIN MANAGED AT OR BELOW PT'S PAIN GOAL Description: At or below level 4 Outcome: Progressing   Problem: RH KNOWLEDGE DEFICIT GENERAL Goal: RH STG INCREASE KNOWLEDGE OF SELF CARE AFTER HOSPITALIZATION Description: Pt will be able to manage care at discharge independently using handouts and educational resources  Outcome: Progressing

## 2019-07-30 NOTE — Progress Notes (Signed)
Patient ID: Kelly Key, male   DOB: Sep 11, 1971, 48 y.o.   MRN: [id number]  [facility] Progress Note   Assessment/ Plan:   1.  Generalized weakness/debilitation following hospitalization and acute on chronic renal failure.  Resolved right knee pain (from acute gout) with topical Voltaren gel and Medrol taper pack. 2. ESRD: With proteinuric chronic kidney disease stage IV and progression over the recent hospitalizations now to requiring hemodialysis for likely ESRD.  Excellent UOP but worsening azotemia-no clear indication for renal recovery and will get HD today with minimal UF.  Pursue permanent dialysis access as an outpatient with vascular surgery following discharge. 3. Anemia: Hemoglobin/hematocrit low but stable, no overt loss.  Continue ESA. 4. CKD-MBD: Calcium level acceptable with elevated phosphorus, uptitrate sevelamer 2400 mg 3 times daily AC. 5. Nutrition: Continue renal diet, begin renal multivitamin and continue Nepro for protein supplementation. 6. Hypertension: Blood pressure elevated--increase amlodipine to 10mg  and will restart lasix if UOP slows down.  Subjective:   Reports to be feeling better and denies any chest pain or shortness of breath.    Objective:   BP (!) 169/111 (BP Location: Left Arm)   Pulse 88   Temp 97.7 F (36.5 C) (Oral)   Resp 16   Ht 5\' 7"  (1.702 m)   Wt 129.4 kg   SpO2 97%   BMI 44.68 kg/m   Physical Exam: Gen: Comfortable, sitting up in wheelchair CVS: Pulse regular rhythm, normal rate, S1 and S2 normal Resp: Clear to auscultation bilaterally, no rales or rhonchi.  Right IJ TDC Abd: Soft, obese, nontender Ext: 1+ pitting bilateral lower extremity edema  Labs: BMET Recent Labs  Lab 07/23/19 1523 07/26/19 1250 07/27/19 0757 07/28/19 0659 07/29/19 0639 07/30/19 0609  NA 129* 131* 134* 134* 136 134*  K 3.6 4.2 4.0 3.9 3.9 4.0  CL 91* 92* 95* 96* 94* 97*  CO2 22 22 24 26 27 22   GLUCOSE 179* 187* 264* 141* 97 112*   BUN 58* 71* 40* 60* 71* 81*  CREATININE 6.71* 6.54* 4.23* 4.97* 5.35* 5.75*  CALCIUM 8.6* 9.0 8.7* 8.7* 8.7* 8.7*  PHOS 6.1* 4.7*  --  4.9* 5.0* 5.3*   CBC Recent Labs  Lab 07/23/19 1523 07/26/19 1539  WBC 10.4 12.0*  HGB 9.5* 9.3*  HCT 29.2* 29.6*  MCV 78.9* 78.3*  PLT 327 404*   Medications:    . allopurinol  200 mg Oral Daily  . amLODipine  5 mg Oral QHS  . aspirin EC  81 mg Oral Daily  . atorvastatin  80 mg Oral Daily  . carvedilol  25 mg Oral BID WC  . Chlorhexidine Gluconate Cloth  6 each Topical Q0600  . cloNIDine  0.1 mg Oral TID  . darbepoetin (ARANESP) injection - DIALYSIS  60 mcg Intravenous Q Fri-HD  . diclofenac sodium  2 g Topical QID  . ezetimibe  10 mg Oral QPM  . fluticasone  1 spray Each Nare Daily  . gabapentin  100 mg Oral TID  . heparin  5,000 Units Subcutaneous Q8H  . insulin aspart  0-20 Units Subcutaneous TID WC  . insulin aspart  0-5 Units Subcutaneous QHS  . insulin aspart  3 Units Subcutaneous TID WC  . insulin detemir  40 Units Subcutaneous BID  . linaclotide  145 mcg Oral QAC breakfast  . multivitamin  1 tablet Oral QHS  . sevelamer carbonate  2,400 mg Oral TID WC   Elmarie Shiley, MD 07/30/2019, 10:09 AM

## 2019-07-31 ENCOUNTER — Inpatient Hospital Stay (HOSPITAL_COMMUNITY): Payer: Medicare Other

## 2019-07-31 LAB — RENAL FUNCTION PANEL
Albumin: 2.5 g/dL — ABNORMAL LOW (ref 3.5–5.0)
Anion gap: 13 (ref 5–15)
BUN: 42 mg/dL — ABNORMAL HIGH (ref 6–20)
CO2: 24 mmol/L (ref 22–32)
Calcium: 8.1 mg/dL — ABNORMAL LOW (ref 8.9–10.3)
Chloride: 98 mmol/L (ref 98–111)
Creatinine, Ser: 3.9 mg/dL — ABNORMAL HIGH (ref 0.61–1.24)
GFR calc Af Amer: 20 mL/min — ABNORMAL LOW (ref >60)
GFR calc non Af Amer: 17 mL/min — ABNORMAL LOW (ref >60)
Glucose, Bld: 108 mg/dL — ABNORMAL HIGH (ref 70–99)
Phosphorus: 4.1 mg/dL (ref 2.5–4.6)
Potassium: 3.5 mmol/L (ref 3.5–5.1)
Sodium: 135 mmol/L (ref 135–145)

## 2019-07-31 LAB — GLUCOSE, CAPILLARY
Glucose-Capillary: 105 mg/dL — ABNORMAL HIGH (ref 70–99)
Glucose-Capillary: 88 mg/dL (ref 70–99)
Glucose-Capillary: 137 mg/dL — ABNORMAL HIGH (ref 70–99)
Glucose-Capillary: 111 mg/dL — ABNORMAL HIGH (ref 70–99)
Glucose-Capillary: 135 mg/dL — ABNORMAL HIGH (ref 70–99)

## 2019-07-31 NOTE — Progress Notes (Signed)
[place] PHYSICAL MEDICINE & REHABILITATION PROGRESS NOTE  Subjective/Complaints:  Pt reports feels dizzy/lightheaded- head swimming- notes that had HD late last night- BP was 96/76 when sat up- was 137/100 lying down initially- after TEDs and elevating legs, BP up to [id number] systolic.  Still feels dizzy.    ROS: Patient denies, nausea, vomiting, diarrhea, cough, shortness of breath or chest pain,     Objective: Vital Signs: Blood pressure 128/85, pulse 84, temperature (!) 97.5 F (36.4 C), temperature source Oral, resp. rate 18, height 5\' 7"  (1.702 m), weight 129.5 kg, SpO2 98 %. No results found. Recent Labs    07/30/19 1710  WBC 8.5  HGB 9.6*  HCT 31.5*  PLT 367   Recent Labs    07/30/19 0609 07/31/19 0557  NA 134* 135  K 4.0 3.5  CL 97* 98  CO2 22 24  GLUCOSE 112* 108*  BUN 81* 42*  CREATININE 5.75* 3.90*  CALCIUM 8.7* 8.1*    Physical Exam: BP 128/85 (BP Location: Right Arm)   Pulse 84   Temp (!) 97.5 F (36.4 C) (Oral)   Resp 18   Ht 5\' 7"  (1.702 m)   Wt 129.5 kg   SpO2 98%   BMI 44.71 kg/m  Constitutional: No distress . Vital signs and labs reviewed. Pt lying down due to dizziness- appears paler than normal/clammy, No acute distress HEENT: EOMI, oral membranes moist Neck: supple Cardiovascular: RRR without murmur. No JVD    Respiratory: CTA Bilaterally without wheezes or rales. Normal effort    GI: BS +, non-tender, non-distended   Skin: Warm and dry.  Intact. Psych: flat but cooperative Musc: Right wrist with minimal radial tenderness today Right knee tender, swollen, warm, medial jt line tenderness Neurological: Alert. Bilateral upper extremities: 4/5 proximal to distal,   Bilateral lower extremities: 4--4/5 proximal to distal,   Skin: Vascular changes bilateral lower extremities Wearing TEDs- legs elevated   Assessment/Plan: 1. Functional deficits secondary to debility which require 3+ hours per day of interdisciplinary therapy in a  comprehensive inpatient rehab setting.  Physiatrist is providing close team supervision and 24 hour management of active medical problems listed below.  Physiatrist and rehab team continue to assess barriers to discharge/monitor patient progress toward functional and medical goals  Care Tool:  Bathing  Bathing activity did not occur: Safety/medical concerns Body parts bathed by patient: Right arm, Left arm, Chest, Abdomen, Front perineal area, Buttocks, Right upper leg, Left upper leg, Right lower leg, Left lower leg, Face   Body parts bathed by helper: Right arm, Left arm, Face(Did not attempt other parts) Body parts n/a: Buttocks, Right arm, Left arm, Chest, Abdomen   Bathing assist Assist Level: Contact Guard/Touching assist     Upper Body Dressing/Undressing Upper body dressing   What is the patient wearing?: Pull over shirt    Upper body assist Assist Level: Set up assist    Lower Body Dressing/Undressing Lower body dressing    Lower body dressing activity did not occur: N/A What is the patient wearing?: Underwear/pull up, Pants     Lower body assist Assist for lower body dressing: Contact Guard/Touching assist     Toileting Toileting Toileting Activity did not occur (Clothing management and hygiene only): N/A (no void or bm)  Toileting assist Assist for toileting: Moderate Assistance - Patient 50 - 74%     Transfers Chair/bed transfer  Transfers assist     Chair/bed transfer assist level: Contact Guard/Touching assist Chair/bed transfer assistive device: [name]  Locomotion Ambulation   Ambulation assist      Assist level: Minimal Assistance - Patient > 75% Assistive device: Rollator Max distance: 30 ft   Walk 10 feet activity   Assist  Walk 10 feet activity did not occur: Safety/medical concerns  Assist level: Minimal Assistance - Patient > 75% Assistive device: Rollator   Walk 50 feet activity   Assist Walk 50 feet with 2 turns activity  did not occur: Safety/medical concerns  Assist level: Contact Guard/Touching assist Assistive device: Walker-rolling    Walk 150 feet activity   Assist Walk 150 feet activity did not occur: Safety/medical concerns  Assist level: Contact Guard/Touching assist Assistive device: Walker-rolling    Walk 10 feet on uneven surface  activity   Assist Walk 10 feet on uneven surfaces activity did not occur: Safety/medical concerns         Wheelchair     Assist Will patient use wheelchair at discharge?: No Type of Wheelchair: Manual    Wheelchair assist level: Supervision/Verbal cueing Max wheelchair distance: 100 ft    Wheelchair 50 feet with 2 turns activity    Assist        Assist Level: Supervision/Verbal cueing   Wheelchair 150 feet activity     Assist            Medical Problem List and Plan: 1.  Generalized weakness with limitations in self-care secondary to debility- acute on chronic renal failure   Cont CIR PT,OT,  SLP   2.  Antithrombotics: -DVT/anticoagulation:  Pharmaceutical: Heparin             -antiplatelet therapy: On ASA 81 mg daily 3. Pain Management: On gabapentin 3 times daily for neuropathy with hydrocodone on PRN basis             Voltaren gel ordered for right wrist on 9/22--improved  Right knee pain gout medrol ordered  4. Mood: LCSW to follow for evaluation and support             -antipsychotic agents: N/A 5. Neuropsych: This patient is capable of making decisions on his own behalf. 6. Skin/Wound Care: Routine pressure relief measures 7. Fluids/Electrolytes/Nutrition: CM/renal diet with 1200 cc FR.  8.  T2DM with hyperglycemia: Hemoglobin A1c-12.4 and poorly controlled.  Continue Levemir twice daily and use SSI for tighter blood sugar control.  Monitor blood sugars AC/HS.   Levemir increased to 39 twice daily on 9/24  -added meal time scheduled covg 3u tid 9/26  Labile still. Observe for pattern today  -better control  10/4                . CBG (last 3)  Recent Labs    07/31/19 0628 07/31/19 0858 07/31/19 1152  GLUCAP 105* 88 137*  may increase to 40U levemir BID-am CBG ok , will increase mealtime coverage  BGs low to high 100s- but improved 9.  Hypertension: Monitor BP TID--follow-up with [name] for work-up to rule out RAS. Continue amlodipine, Coreg twice daily, clonidine TID    Furosemide decreased to daily 20 on 9/22   Vitals:   07/31/19 1005 07/31/19 1413  BP: (!) 154/103 128/85  Pulse: 73 84  Resp:  18  Temp:  (!) 97.5 F (36.4 C)  SpO2:  98%   need to increase amlodipine on 10/3- done  Of note, pt very dizzy/lightheaded this AM after HD- BP was not too low, however might be adding Norvasc that did it- please reassess.  10.  Anemia  of chronic disease: Received IV iron             Labs with HD  Hemoglobin 9.3 on 9/29 11.  ESRD/chronic combined CHF: Anasarca multifactorial and improving with hemodialysis.             Daily weights  Filed Weights   07/30/19 1708 07/30/19 2108 07/31/19 0500  Weight: 129.4 kg 128 kg 129.5 kg   Stable on 10/3 12.  Right knee pain- gout     Continue Voltaren gel   Brace    Much improved on medrol dosepack 13.  Leukocytosis  WBCs 10.4 on 9/26  Resolved   LOS: 15 days A FACE TO FACE EVALUATION WAS PERFORMED  Niquan Charnley 07/31/2019, 3:20 PM

## 2019-07-31 NOTE — Progress Notes (Signed)
Patient ID: Kelly Key, male   DOB: 1971-03-15, 48 y.o.   MRN: [id number]  [facility] Progress Note   Assessment/ Plan:   1.  Generalized weakness/debilitation following hospitalization and acute on chronic renal failure.  Resolved right knee pain (from acute gout) with Medrol taper/topical Voltaren. 2. ESRD: With proteinuric chronic kidney disease stage IV and progression over the recent hospitalizations now to requiring hemodialysis for likely ESRD.  He maintains excellent urine output and we will do daily labs with guarded optimism regarding possible renal recovery/ability to come off of dialysis.  Will schedule dialysis as indicated by clinical exam/labs.  Yesterday dialysis done for management of azotemia without uremia. 3. Anemia: Hemoglobin/hematocrit low but stable, no overt loss.  Continue ESA. 4. CKD-MBD: Calcium level acceptable with phosphorus at goal on current doses of sevelamer/HD. 5. Nutrition: Continue renal diet, renal multivitamin and Nepro. 6. Hypertension: Blood pressure elevated--increased amlodipine to 10mg  and will restart lasix if UOP slows down.  Subjective:   Denies any acute events overnight.  Tolerated hemodialysis without problems..    Objective:   BP (!) 154/103 (BP Location: Right Wrist)   Pulse 73   Temp 98.5 F (36.9 C) (Oral)   Resp 17   Ht 5\' 7"  (1.702 m)   Wt 129.5 kg   SpO2 100%   BMI 44.71 kg/m   Physical Exam: Gen: Comfortably resting in bed, watching television CVS: Pulse regular rhythm, normal rate, S1 and S2 normal Resp: Clear to auscultation bilaterally, no rales or rhonchi.  Right IJ TDC Abd: Soft, obese, nontender Ext: 1+ pitting bilateral lower extremity edema  Labs: BMET Recent Labs  Lab 07/26/19 1250 07/27/19 0757 07/28/19 0659 07/29/19 0639 07/30/19 0609 07/31/19 0557  NA 131* 134* 134* 136 134* 135  K 4.2 4.0 3.9 3.9 4.0 3.5  CL 92* 95* 96* 94* 97* 98  CO2 22 24 26 27 22 24   GLUCOSE 187* 264* 141* 97  112* 108*  BUN 71* 40* 60* 71* 81* 42*  CREATININE 6.54* 4.23* 4.97* 5.35* 5.75* 3.90*  CALCIUM 9.0 8.7* 8.7* 8.7* 8.7* 8.1*  PHOS 4.7*  --  4.9* 5.0* 5.3* 4.1   CBC Recent Labs  Lab 07/26/19 1539 07/30/19 1710  WBC 12.0* 8.5  HGB 9.3* 9.6*  HCT 29.6* 31.5*  MCV 78.3* 78.6*  PLT 404* 367   Medications:    . allopurinol  200 mg Oral Daily  . amLODipine  10 mg Oral QHS  . aspirin EC  81 mg Oral Daily  . atorvastatin  80 mg Oral Daily  . carvedilol  25 mg Oral BID WC  . Chlorhexidine Gluconate Cloth  6 each Topical Q0600  . cloNIDine  0.1 mg Oral TID  . darbepoetin (ARANESP) injection - DIALYSIS  60 mcg Intravenous Q Fri-HD  . diclofenac sodium  2 g Topical QID  . ezetimibe  10 mg Oral QPM  . fluticasone  1 spray Each Nare Daily  . gabapentin  100 mg Oral TID  . heparin  5,000 Units Subcutaneous Q8H  . insulin aspart  0-20 Units Subcutaneous TID WC  . insulin aspart  0-5 Units Subcutaneous QHS  . insulin aspart  3 Units Subcutaneous TID WC  . insulin detemir  40 Units Subcutaneous BID  . linaclotide  145 mcg Oral QAC breakfast  . multivitamin  1 tablet Oral QHS  . sevelamer carbonate  2,400 mg Oral TID WC   Elmarie Shiley, MD 07/31/2019, 11:13 AM

## 2019-07-31 NOTE — Plan of Care (Signed)
  Problem: Consults Goal: RH GENERAL PATIENT EDUCATION Description: See Patient Education module for education specifics. Outcome: Progressing Goal: Skin Care Protocol Initiated - if Braden Score 18 or less Description: If consults are not indicated, leave blank or document N/A Outcome: Progressing Goal: Nutrition Consult-if indicated Outcome: Progressing Goal: Diabetes Guidelines if Diabetic/Glucose > 140 Description: If diabetic or lab glucose is > 140 mg/dl - Initiate Diabetes/Hyperglycemia Guidelines & Document Interventions  Outcome: Progressing   Problem: RH BOWEL ELIMINATION Goal: RH STG MANAGE BOWEL WITH ASSISTANCE Description: STG Manage Bowel with  mod I Assistance. Outcome: Progressing Goal: RH STG MANAGE BOWEL W/MEDICATION W/ASSISTANCE Description: STG Manage Bowel with Medication with mod I Assistance. Outcome: Progressing   Problem: RH BLADDER ELIMINATION Goal: RH STG MANAGE BLADDER WITH ASSISTANCE Description: STG Manage Bladder With  min Assistance Outcome: Progressing   Problem: RH SAFETY Goal: RH STG ADHERE TO SAFETY PRECAUTIONS W/ASSISTANCE/DEVICE Description: STG Adhere to Safety Precautions With cues/reminders Assistance/Device. Outcome: Progressing Goal: RH STG DECREASED RISK OF FALL WITH ASSISTANCE Description: STG Decreased Risk of Fall With min Assistance. Outcome: Progressing   Problem: RH PAIN MANAGEMENT Goal: RH STG PAIN MANAGED AT OR BELOW PT'S PAIN GOAL Description: At or below level 4 Outcome: Progressing   Problem: RH KNOWLEDGE DEFICIT GENERAL Goal: RH STG INCREASE KNOWLEDGE OF SELF CARE AFTER HOSPITALIZATION Description: Pt will be able to manage care at discharge independently using handouts and educational resources  Outcome: Progressing

## 2019-07-31 NOTE — Progress Notes (Signed)
Physical Therapy Session Note  Patient Details  Name: Kelly Key MRN: [id number] Date of Birth: 03-Jul-1971  Today's Date: 07/31/2019 PT Individual Time: [id number] PT Individual Time Calculation (min): 70 min   Short Term Goals: Week 2:  PT Short Term Goal 1 (Week 2): Pt will perform transfers with CGA PT Short Term Goal 2 (Week 2): Pt will ambulate 50 ft with CGA and LRAD PT Short Term Goal 3 (Week 2): Pt will perform sit<>stands consistently with CGA  Skilled Therapeutic Interventions/Progress Updates:    Pt supine in bed upon PT arrival, agreeable to therapy tx and reports pain 8/10 in R ankle with weightbearing activities, limited R LE weightbearing throughout session for pain management. Pt transferred to sitting with supervision. Pt agreeable to try ambulating with rollator this session. Pt ambulated x 30 ft and x 10 ft with rollator and min assist, seated rest break between bouts on rollator seat, pt lock breaks appropriately, limited because of R ankle pain. Pt reports he does not want to continue working on walking because of ankle pain. Pt transferred from rollator to w/c with min assist stand pivot and pt propelled w/c x 80 ft to the gym. Stand pivot to the mat with CGA. Pt worked on standing activity tolerance and balance to perform horseshoe toss activity x 2 trials with CGA, cues for keeping weightshifted to L side in standing to minimize R ankle pain. Pt worked on R LE strengthening and standing tolerance in order to perform R LE standing marches 2 x 10 and R LE standing hip abduction 2 x 10  with UE support on RW, CGA (only able to perform on R LE, unable to perform on L LE secondary to R ankle pain). Pt worked on seated balance, UE strength and coordination in order to hit ball back and forth with 5# dowel, x 2 trials with supervision. Stand pivot back to w/c with min assist, transported to the dayroom. Pt performed stand pivot from w/c<>nustep this session with CGA, used nustep this  session x 8 minutes on workload 5 working on global strengthening and endurance. Pt transported back to room at end of session and left in w/c with needs in reach and chair alarm set.   Therapy Documentation Precautions:  Precautions Precautions: Fall Restrictions Weight Bearing Restrictions: No   Therapy/Group: Individual Therapy   Netta Corrigan, PT, DPT, CSRS 07/31/19  8:02 AM

## 2019-08-01 ENCOUNTER — Inpatient Hospital Stay (HOSPITAL_COMMUNITY): Payer: Medicare Other

## 2019-08-01 ENCOUNTER — Inpatient Hospital Stay (HOSPITAL_COMMUNITY): Payer: Medicare Other | Admitting: Occupational Therapy

## 2019-08-01 LAB — CBC WITH DIFFERENTIAL/PLATELET
Abs Immature Granulocytes: 0.03 10*3/uL (ref 0.00–0.07)
Basophils Absolute: 0 10*3/uL (ref 0.0–0.1)
Basophils Relative: 0 %
Eosinophils Absolute: 0.4 10*3/uL (ref 0.0–0.5)
Eosinophils Relative: 5 %
HCT: 33 % — ABNORMAL LOW (ref 39.0–52.0)
Hemoglobin: 10.2 g/dL — ABNORMAL LOW (ref 13.0–17.0)
Immature Granulocytes: 0 %
Lymphocytes Relative: 35 %
Lymphs Abs: 3.2 10*3/uL (ref 0.7–4.0)
MCH: 24.2 pg — ABNORMAL LOW (ref 26.0–34.0)
MCHC: 30.9 g/dL (ref 30.0–36.0)
MCV: 78.4 fL — ABNORMAL LOW (ref 80.0–100.0)
Monocytes Absolute: 0.9 10*3/uL (ref 0.1–1.0)
Monocytes Relative: 10 %
Neutro Abs: 4.6 10*3/uL (ref 1.7–7.7)
Neutrophils Relative %: 50 %
Platelets: 384 10*3/uL (ref 150–400)
RBC: 4.21 MIL/uL — ABNORMAL LOW (ref 4.22–5.81)
RDW: 15.9 % — ABNORMAL HIGH (ref 11.5–15.5)
WBC: 9.2 10*3/uL (ref 4.0–10.5)
nRBC: 0 % (ref 0.0–0.2)

## 2019-08-01 LAB — RENAL FUNCTION PANEL
Albumin: 2.6 g/dL — ABNORMAL LOW (ref 3.5–5.0)
Anion gap: 13 (ref 5–15)
BUN: 54 mg/dL — ABNORMAL HIGH (ref 6–20)
CO2: 24 mmol/L (ref 22–32)
Calcium: 8.6 mg/dL — ABNORMAL LOW (ref 8.9–10.3)
Chloride: 95 mmol/L — ABNORMAL LOW (ref 98–111)
Creatinine, Ser: 4.85 mg/dL — ABNORMAL HIGH (ref 0.61–1.24)
GFR calc Af Amer: 15 mL/min — ABNORMAL LOW (ref >60)
GFR calc non Af Amer: 13 mL/min — ABNORMAL LOW (ref >60)
Glucose, Bld: 136 mg/dL — ABNORMAL HIGH (ref 70–99)
Phosphorus: 4.4 mg/dL (ref 2.5–4.6)
Potassium: 4 mmol/L (ref 3.5–5.1)
Sodium: 132 mmol/L — ABNORMAL LOW (ref 135–145)

## 2019-08-01 LAB — GLUCOSE, CAPILLARY
Glucose-Capillary: 66 mg/dL — ABNORMAL LOW (ref 70–99)
Glucose-Capillary: 84 mg/dL (ref 70–99)
Glucose-Capillary: 70 mg/dL (ref 70–99)
Glucose-Capillary: 114 mg/dL — ABNORMAL HIGH (ref 70–99)
Glucose-Capillary: 158 mg/dL — ABNORMAL HIGH (ref 70–99)

## 2019-08-01 MED ORDER — CHLORHEXIDINE GLUCONATE CLOTH 2 % EX PADS: 6.0000 | MEDICATED_PAD | Freq: Every day | CUTANEOUS | Status: DC

## 2019-08-01 MED ORDER — INSULIN DETEMIR 100 UNIT/ML ~~LOC~~ SOLN
38.0000 [IU] | Freq: Two times a day (BID) | SUBCUTANEOUS | Status: DC
  Administered 2019-08-01 – 2019-08-03 (×4): 38 [IU] via SUBCUTANEOUS
  Filled 2019-08-01 (×5): qty 0.38

## 2019-08-01 MED ORDER — AMLODIPINE BESYLATE 5 MG PO TABS
5.0000 mg | ORAL_TABLET | Freq: Every day | ORAL | Status: DC
  Administered 2019-08-01 – 2019-08-02 (×2): 5 mg via ORAL
  Filled 2019-08-01 (×2): qty 1

## 2019-08-01 NOTE — Progress Notes (Signed)
Physical Therapy Session Note  Patient Details  Name: Kelly Key MRN: [id number] Date of Birth: 1970/11/12  Today's Date: 08/01/2019 PT Individual Time: 1315-1400 PT Individual Time Calculation (min): 45 min  and Today's Date: 08/01/2019 PT Missed Time: 15 Minutes Missed Time Reason: Nursing care(med administration)  Short Term Goals: Week 2:  PT Short Term Goal 1 (Week 2): Pt will perform transfers with CGA PT Short Term Goal 2 (Week 2): Pt will ambulate 50 ft with CGA and LRAD PT Short Term Goal 3 (Week 2): Pt will perform sit<>stands consistently with CGA  Skilled Therapeutic Interventions/Progress Updates:    Pt seated in w/c upon PT arrival, pt missed 15 minutes of skilled therapy tx secondary to nursing care (med administration). Pt reports R ankle pain 8/10, limited weightbearing activities and applied ice at end of session for pain management. Pt propelled w/c x 50 ft using B UEs with supervision this session working on UE strength and endurance. Pt performed sit<>stand with RW and supervision, ambulated x 5 ft with RW and supervision, limited secondary to R ankle pain with weightbearing (8/10). Pt seated edge of mat performed UE strengthening exercises with orange theraband, 2 x 10 of each with cues for techniques: reverse fly, shoulder flexion, and UE punches. Throughout session during seated rest breaks pt performed LAQ, hip abduction with orange theraband and ankle pumps for AROM.  Pt ambulated x 30 ft with RW and supervision, antalgic gait noted, cues for upright posture. Pt worked on standing balance and standing endurance this session to participate in game of wii bowling, single UE support on RW during bowling and multiple sit<>stands performed throughout game with supervision. Pt transported back to room and left in w/c with needs in reach and chair alarm set.   Therapy Documentation Precautions:  Precautions Precautions: Fall Restrictions Weight Bearing Restrictions:  No    Therapy/Group: Individual Therapy   Netta Corrigan, PT, DPT, CSRS 08/01/19  1:31 PM

## 2019-08-01 NOTE — Progress Notes (Signed)
Patient ID: Kelly Key, male   DOB: Jan 10, 1971, 48 y.o.   MRN: [id number]  [facility] Progress Note   Assessment/ Plan:   1.  Generalized weakness/debilitation following hospitalization and acute on chronic renal failure.  Resolved right knee pain (from acute gout) with Medrol taper/topical Voltaren.  2. ESRD: With proteinuric chronic kidney disease stage IV and progression over the recent hospitalizations now to requiring hemodialysis for likely ESRD.  - Good urine output; has been maintained on HD for clearance  - spoke with HD coordinator/SW; he has been clipped to [facility] and can start there after discharge later this week.  They are checking to see if clipped as AKI or ESRD.  Given length of need and clearance concern for ESRD - HD per TTS schedule; next tx tomorrow, 10/6   3. Anemia: Hemoglobin/hematocrit low but stable, no overt loss.  Continue ESA.   4. CKD-MBD: Calcium level acceptable with phosphorus at goal on current doses of sevelamer/HD  5. Nutrition: Continue renal diet, renal multivitamin and Nepro.  6. Hypertension:  Continue current regimen.  Agree with reducing amlodipine back for now   Subjective:   Had 2.6 liters UOP over 10/4.  Spoke with HD coordinator and has spot for [facility]   Review of systems:   Denies shortness of breath  Denies n/v No cp Eating well    Objective:   BP 117/83 (BP Location: Left Arm)   Pulse 80   Temp 98 F (36.7 C) (Oral)   Resp 18   Ht 5\' 7"  (1.702 m)   Wt 129.9 kg   SpO2 99%   BMI 44.85 kg/m   Physical Exam: Gen: adult male in bed in NAD  CVS: Pulse regular rhythm, normal rate, S1 and S2 normal Resp: Clear to auscultation bilaterally, no rales or rhonchi Abd: Soft, obese, nontender Ext: 1+ pitting bilateral lower extremity edema Neuro - alert and oriented x 3 Access: RIJ tunneled dialysis catheter   Labs: BMET Recent Labs  Lab 07/26/19 1250 07/27/19 0757 07/28/19 0659 07/29/19 0639  07/30/19 0609 07/31/19 0557 08/01/19 0745  NA 131* 134* 134* 136 134* 135 132*  K 4.2 4.0 3.9 3.9 4.0 3.5 4.0  CL 92* 95* 96* 94* 97* 98 95*  CO2 22 24 26 27 22 24 24   GLUCOSE 187* 264* 141* 97 112* 108* 136*  BUN 71* 40* 60* 71* 81* 42* 54*  CREATININE 6.54* 4.23* 4.97* 5.35* 5.75* 3.90* 4.85*  CALCIUM 9.0 8.7* 8.7* 8.7* 8.7* 8.1* 8.6*  PHOS 4.7*  --  4.9* 5.0* 5.3* 4.1 4.4   CBC Recent Labs  Lab 07/26/19 1539 07/30/19 1710 08/01/19 0745  WBC 12.0* 8.5 9.2  NEUTROABS  --   --  4.6  HGB 9.3* 9.6* 10.2*  HCT 29.6* 31.5* 33.0*  MCV 78.3* 78.6* 78.4*  PLT 404* 367 384   Medications:    . allopurinol  200 mg Oral Daily  . amLODipine  5 mg Oral QHS  . aspirin EC  81 mg Oral Daily  . atorvastatin  80 mg Oral Daily  . carvedilol  25 mg Oral BID WC  . Chlorhexidine Gluconate Cloth  6 each Topical Q0600  . cloNIDine  0.1 mg Oral TID  . darbepoetin (ARANESP) injection - DIALYSIS  60 mcg Intravenous Q Fri-HD  . diclofenac sodium  2 g Topical QID  . ezetimibe  10 mg Oral QPM  . fluticasone  1 spray Each Nare Daily  . gabapentin  100 mg Oral TID  . heparin  5,000 Units Subcutaneous Q8H  . insulin aspart  0-20 Units Subcutaneous TID WC  . insulin aspart  0-5 Units Subcutaneous QHS  . insulin aspart  3 Units Subcutaneous TID WC  . insulin detemir  38 Units Subcutaneous BID  . linaclotide  145 mcg Oral QAC breakfast  . multivitamin  1 tablet Oral QHS  . sevelamer carbonate  2,400 mg Oral TID WC   Claudia Desanctis 08/01/2019, 6:09 PM

## 2019-08-01 NOTE — Progress Notes (Addendum)
Social Work Patient ID: Kelly Key, male   DOB: [birth year], 48 y.o.   MRN: [id number]    Diagnosis codes: N18.6, R53.81 & E11.65  Height:  5'7              Weight:   268 lbs         Patient suffers from ESRD and debility   which impairs his ability to perform daily activities like ADL's and tolieting   in the home.  A walker  will not resolve issue with performing activities of daily living.  A wheelchair will allow patient to safely perform daily activities.  Patient is able to propel themselves in the home using a standard weight wheelchair.

## 2019-08-01 NOTE — Progress Notes (Signed)
[facility] PHYSICAL MEDICINE & REHABILITATION PROGRESS NOTE  Subjective/Complaints:    Hypoglycemia despite hs snack     ROS: Patient denies, nausea, vomiting, diarrhea, cough, shortness of breath or chest pain,     Objective: Vital Signs: Blood pressure 128/70, pulse 88, temperature 97.8 F (36.6 C), temperature source Oral, resp. rate 16, height 5\' 7"  (1.702 m), weight 129.9 kg, SpO2 100 %. No results found. Recent Labs    07/30/19 1710 08/01/19 0745  WBC 8.5 9.2  HGB 9.6* 10.2*  HCT 31.5* 33.0*  PLT 367 384   Recent Labs    07/30/19 0609 07/31/19 0557  NA 134* 135  K 4.0 3.5  CL 97* 98  CO2 22 24  GLUCOSE 112* 108*  BUN 81* 42*  CREATININE 5.75* 3.90*  CALCIUM 8.7* 8.1*    Physical Exam: BP 128/70 (BP Location: Right Arm)   Pulse 88   Temp 97.8 F (36.6 C) (Oral)   Resp 16   Ht 5\' 7"  (1.702 m)   Wt 129.9 kg   SpO2 100%   BMI 44.85 kg/m  Constitutional: No distress . Vital signs and labs reviewed. Pt lying down due to dizziness- appears paler than normal/clammy, No acute distress HEENT: EOMI, oral membranes moist Neck: supple Cardiovascular: RRR without murmur. No JVD    Respiratory: CTA Bilaterally without wheezes or rales. Normal effort    GI: BS +, non-tender, non-distended   Skin: Warm and dry.  Intact. Psych: flat but cooperative Musc: Right wrist with minimal radial tenderness today Right knee tender, swollen, warm, medial jt line tenderness Neurological: Alert. Bilateral upper extremities: 4/5 proximal to distal,   Bilateral lower extremities: 4--4/5 proximal to distal,   Skin: Vascular changes bilateral lower extremities Wearing TEDs- legs elevated   Assessment/Plan: 1. Functional deficits secondary to debility which require 3+ hours per day of interdisciplinary therapy in a comprehensive inpatient rehab setting.  Physiatrist is providing close team supervision and 24 hour management of active medical problems listed  below.  Physiatrist and rehab team continue to assess barriers to discharge/monitor patient progress toward functional and medical goals  Care Tool:  Bathing  Bathing activity did not occur: Safety/medical concerns Body parts bathed by patient: Right arm, Left arm, Chest, Abdomen, Front perineal area, Buttocks, Right upper leg, Left upper leg, Right lower leg, Left lower leg, Face   Body parts bathed by helper: Right arm, Left arm, Face(Did not attempt other parts) Body parts n/a: Buttocks, Right arm, Left arm, Chest, Abdomen   Bathing assist Assist Level: Contact Guard/Touching assist     Upper Body Dressing/Undressing Upper body dressing   What is the patient wearing?: Pull over shirt    Upper body assist Assist Level: Set up assist    Lower Body Dressing/Undressing Lower body dressing    Lower body dressing activity did not occur: N/A What is the patient wearing?: Underwear/pull up, Pants     Lower body assist Assist for lower body dressing: Contact Guard/Touching assist     Toileting Toileting Toileting Activity did not occur (Clothing management and hygiene only): N/A (no void or bm)  Toileting assist Assist for toileting: Moderate Assistance - Patient 50 - 74%     Transfers Chair/bed transfer  Transfers assist     Chair/bed transfer assist level: Contact Guard/Touching assist Chair/bed transfer assistive device: Programmer, multimedia   Ambulation assist      Assist level: Minimal Assistance - Patient > 75% Assistive device: Rollator Max distance: 30 ft  Walk 10 feet activity   Assist  Walk 10 feet activity did not occur: Safety/medical concerns  Assist level: Minimal Assistance - Patient > 75% Assistive device: Rollator   Walk 50 feet activity   Assist Walk 50 feet with 2 turns activity did not occur: Safety/medical concerns  Assist level: Contact Guard/Touching assist Assistive device: Walker-rolling    Walk 150 feet  activity   Assist Walk 150 feet activity did not occur: Safety/medical concerns  Assist level: Contact Guard/Touching assist Assistive device: Walker-rolling    Walk 10 feet on uneven surface  activity   Assist Walk 10 feet on uneven surfaces activity did not occur: Safety/medical concerns         Wheelchair     Assist Will patient use wheelchair at discharge?: No Type of Wheelchair: Manual    Wheelchair assist level: Supervision/Verbal cueing Max wheelchair distance: 100 ft    Wheelchair 50 feet with 2 turns activity    Assist        Assist Level: Supervision/Verbal cueing   Wheelchair 150 feet activity     Assist            Medical Problem List and Plan: 1.  Generalized weakness with limitations in self-care secondary to debility- acute on chronic renal failure   Cont CIR PT,OT,  SLP   2.  Antithrombotics: -DVT/anticoagulation:  Pharmaceutical: Heparin             -antiplatelet therapy: On ASA 81 mg daily 3. Pain Management: On gabapentin 3 times daily for neuropathy with hydrocodone on PRN basis             Voltaren gel ordered for right wrist on 9/22--improved  Right knee pain gout medrol ordered  4. Mood: LCSW to follow for evaluation and support             -antipsychotic agents: N/A 5. Neuropsych: This patient is capable of making decisions on his own behalf. 6. Skin/Wound Care: Routine pressure relief measures 7. Fluids/Electrolytes/Nutrition: CM/renal diet with 1200 cc FR.  8.  T2DM with hyperglycemia: Hemoglobin A1c-12.4 and poorly controlled.  Continue Levemir twice daily and use SSI for tighter blood sugar control.  Monitor blood sugars AC/HS.   Levemir increased to 39 twice daily on 9/24  -added meal time scheduled covg 3u tid 9/26  Labile still. Observe for pattern today  -better control 10/4                . CBG (last 3)  Recent Labs    07/31/19 2106 08/01/19 0628 08/01/19 0646  GLUCAP 135* 66* 84  may decrease to 38U  levemir BID-am CBG ok , will increase mealtime coverage - hypoglycemia- 9.  Hypertension: Monitor BP TID--follow-up with [name] for work-up to rule out RAS. Continue amlodipine, Coreg twice daily, clonidine TID    Furosemide decreased to daily 20 on 9/22   Vitals:   07/31/19 2021 08/01/19 0504  BP: (!) 147/95 128/70  Pulse: 80 88  Resp: 16 16  Temp: 98 F (36.7 C) 97.8 F (36.6 C)  SpO2: 99% 100%   need to increase amlodipine on 10/3- done  Of note, pt very dizzy/lightheaded this AM after HD- BP was not too low, however might be  Norvasc , reduce 10.  Anemia of chronic disease: Received IV iron             Labs with HD  Hemoglobin 9.3 on 9/29 11.  ESRD/chronic combined CHF: Anasarca multifactorial and improving with  hemodialysis.             Daily weights  Filed Weights   07/30/19 2108 07/31/19 0500 08/01/19 0504  Weight: 128 kg 129.5 kg 129.9 kg   Stable on 10/3 12.  Right knee pain- gout     Continue Voltaren gel   Brace    Much improved on medrol dosepack 13.  Leukocytosis  WBCs 10.4 on 9/26  Resolved   LOS: 16 days A FACE TO [place] E Kirsteins 08/01/2019, 8:21 AM

## 2019-08-01 NOTE — Progress Notes (Signed)
Physical Therapy Session Note  Patient Details  Name: [facility] MRN: [id number] Date of Birth: 1970/11/15  Today's Date: 08/01/2019 PT Individual Time: 1020-1120 PT Individual Time Calculation (min): 60 min   Short Term Goals: Week 2:  PT Short Term Goal 1 (Week 2): Pt will perform transfers with CGA PT Short Term Goal 2 (Week 2): Pt will ambulate 50 ft with CGA and LRAD PT Short Term Goal 3 (Week 2): Pt will perform sit<>stands consistently with CGA  Skilled Therapeutic Interventions/Progress Updates:     Patient in bed upon PT arrival. Patient alert and agreeable to PT session. Patient reported 8/10 R lateral and medial ankle pain during session, RN made aware. PT provided repositioning, rest breaks, and distraction as pain interventions throughout session.   Therapeutic Activity: Bed Mobility: Patient performed supine to sit with supervision in a flat bed without use of bed rails.  Able to recreate R ankle pain with PF and inversion with slight over pressure, noted increased edema under lateral and medial malleoli (lateral>medial). PT donned B TEDs and shoes with total A with patient sitting EOB Transfers: Patient performed sit to/from stand using the RW x2 and stand pivot using the rollator x1 with CGA. Provided verbal cues for placing his R hand on AD and pushing up with his L to shift his weight to his L LE due to R ankle pain in standing. Patient reported increased ankle pain in standing and stated his shoes felt too tight. PT doffed tennis shoes and applied a 4" ACE wrap to his R ankle for increased support and edema control and donned B non-skid socks with total A. Patient unable to off-weight his R LE with rollator, educated on using RW for ambulation and transfers until R ankle pain improves.   Gait Training:  Patient ambulated 5 feet x2 limited by R ankle pain with ambulation using RW with CGA. Ambulated with antalgic gait pattern on R, decreased stance time on R, and  significantly increased trunk flexion. Provided verbal cues for erect posture and sequencing for leading with his R LE using a step-to gait pattern to reduce weight bearing on R ankle.  Wheelchair Mobility:  Patient propelled wheelchair 40 feet with supervision and decreased speed with 1 seated rest break <1 min due to mild SOB. Provided verbal cues for propulsion technique.  Therapeutic Exercise: Patient performed the following exercises with verbal and tactile cues for proper technique. NuStedp 7 min 20 seconds on level 5 Beginning: HR 80 SPO2 98% RPE 1/10 5 min: HR 81 SPO2 97% RPE 5/10 End: HR 79 SPO2 95% RPE 8/10  Patient in w/c at end of session with breaks locked and all needs within reach.    Therapy Documentation Precautions:  Precautions Precautions: Fall Restrictions Weight Bearing Restrictions: No    Therapy/Group: Individual Therapy  Venora Kautzman L Agustina Witzke PT, DPT  08/01/2019, 12:43 PM

## 2019-08-01 NOTE — Progress Notes (Signed)
Hypoglycemic Event  CBG: 66  Treatment: 4 oz juice/soda  Symptoms: None  Follow-up CBG: Time:646 CBG Result:84  Possible Reasons for Event: Unknown  Comments/MD notified: Will monitor    Nikko Goldwire, [employer]

## 2019-08-01 NOTE — Progress Notes (Signed)
Occupational Therapy Session Note  Patient Details  Name: [facility] MRN: [id number] Date of Birth: 08/14/1971  Today's Date: 08/01/2019 OT Individual Time: [id number] OT Individual Time Calculation (min): 68 min    Short Term Goals: Week 3:  OT Short Term Goal 1 (Week 3): Pt will demonstrate increased endurance and stand for 1 grooming activity with min A OT Short Term Goal 2 (Week 3): STG = LTGs due to remaining LOS  Skilled Therapeutic Interventions/Progress Updates:    Upon entering the room, pt supine in bed with c/o pain in B feet/ankles but more so the R with 8/10 pain. Pt received medications prior to therapist arrival. Pt had hypoglycemic event this morning but pt feels recovered. Pt requesting to take shower and change clothing this session. Supine >sit with supervision. BP taken while seated on EOB with results of 151/85. Pt taking increased time for all transfers and transitions this session secondary to pain and requiring encouragement. Pt transferred from bed >wheelchair with supervision and OT assisted pt via wheelchair into bathroom. Pt refused ambulation this session. Transfer with use of grab bars onto TTB with close supervision. Pt bathing from seated position with use of lateral leans and LH sponge to increase time. Pt returning to wheelchair and dressing from EOB with supervision overall. Pt limited standing and performed lateral leans as needed to pull clothing over B hips. OT did notifed increased edema in B feet this session. Pt requesting to remain seated on EOB at end of this session and B LEs are propped. Call bell and all needed items within reach upon exiting the room.   Therapy Documentation Precautions:  Precautions Precautions: Fall Restrictions Weight Bearing Restrictions: No   Therapy/Group: Individual Therapy  Gypsy Decant 08/01/2019, 10:54 AM

## 2019-08-02 ENCOUNTER — Inpatient Hospital Stay (HOSPITAL_COMMUNITY): Payer: Medicare Other | Admitting: Occupational Therapy

## 2019-08-02 ENCOUNTER — Inpatient Hospital Stay (HOSPITAL_COMMUNITY): Payer: Medicare Other | Admitting: Physical Therapy

## 2019-08-02 LAB — GLUCOSE, CAPILLARY
Glucose-Capillary: 79 mg/dL (ref 70–99)
Glucose-Capillary: 70 mg/dL (ref 70–99)
Glucose-Capillary: 92 mg/dL (ref 70–99)
Glucose-Capillary: 240 mg/dL — ABNORMAL HIGH (ref 70–99)

## 2019-08-02 LAB — CBC
HCT: 29.6 % — ABNORMAL LOW (ref 39.0–52.0)
Hemoglobin: 9.1 g/dL — ABNORMAL LOW (ref 13.0–17.0)
MCH: 24.6 pg — ABNORMAL LOW (ref 26.0–34.0)
MCHC: 30.7 g/dL (ref 30.0–36.0)
MCV: 80 fL (ref 80.0–100.0)
Platelets: 355 10*3/uL (ref 150–400)
RBC: 3.7 MIL/uL — ABNORMAL LOW (ref 4.22–5.81)
RDW: 16 % — ABNORMAL HIGH (ref 11.5–15.5)
WBC: 9.2 10*3/uL (ref 4.0–10.5)
nRBC: 0 % (ref 0.0–0.2)

## 2019-08-02 LAB — RENAL FUNCTION PANEL
Albumin: 2.4 g/dL — ABNORMAL LOW (ref 3.5–5.0)
Anion gap: 14 (ref 5–15)
BUN: 68 mg/dL — ABNORMAL HIGH (ref 6–20)
CO2: 22 mmol/L (ref 22–32)
Calcium: 8.4 mg/dL — ABNORMAL LOW (ref 8.9–10.3)
Chloride: 95 mmol/L — ABNORMAL LOW (ref 98–111)
Creatinine, Ser: 5.82 mg/dL — ABNORMAL HIGH (ref 0.61–1.24)
GFR calc Af Amer: 12 mL/min — ABNORMAL LOW (ref >60)
GFR calc non Af Amer: 10 mL/min — ABNORMAL LOW (ref >60)
Glucose, Bld: 177 mg/dL — ABNORMAL HIGH (ref 70–99)
Phosphorus: 5.2 mg/dL — ABNORMAL HIGH (ref 2.5–4.6)
Potassium: 4.2 mmol/L (ref 3.5–5.1)
Sodium: 131 mmol/L — ABNORMAL LOW (ref 135–145)

## 2019-08-02 MED ORDER — METHYLPREDNISOLONE ACETATE 80 MG/ML IJ SUSP
80.0000 mg | Freq: Once | INTRAMUSCULAR | Status: AC
  Administered 2019-08-02: 80 mg via INTRAMUSCULAR
  Filled 2019-08-02: qty 1

## 2019-08-02 MED ORDER — HEPARIN SODIUM (PORCINE) 1000 UNIT/ML IJ SOLN
INTRAMUSCULAR | Status: AC
  Filled 2019-08-02: qty 1

## 2019-08-02 NOTE — Progress Notes (Signed)
Renal Navigator met with patient at bedside to review OP HD clinic information as it was prior to admission to CIR when we spoke about this. Patient is familiar with [facility] and understands that he needs to be there at 10:30am on Thursday, 08/04/19 for his first treatment. Renal Navigator explained that he needs to sign paperwork prior to his first treatment and that his regular chair time will be 12:00pm TTS and to get there at 11:40am starting Saturday, 08/06/19. Patient stated understanding. He asked Renal Navigator about permanent access in light of the possibility of renal recovery. Renal Navigator advised patient to speak with MD at his OP HD clinic regarding recommendation for permanent access.   [name], [place] Renal Navigator [phone]

## 2019-08-02 NOTE — Progress Notes (Signed)
[facility] PHYSICAL MEDICINE & REHABILITATION PROGRESS NOTE  Subjective/Complaints:    Increasing ankle pain, per PT R>L leg and ankle swelling.  Scheduled for HD today     ROS: Patient denies, nausea, vomiting, diarrhea, cough, shortness of breath or chest pain,     Objective: Vital Signs: Blood pressure 132/82, pulse 79, temperature 98.6 F (37 C), temperature source Oral, resp. rate 18, height 5\' 7"  (1.702 m), weight 129.9 kg, SpO2 98 %. No results found. Recent Labs    07/30/19 1710 08/01/19 0745  WBC 8.5 9.2  HGB 9.6* 10.2*  HCT 31.5* 33.0*  PLT 367 384   Recent Labs    07/31/19 0557 08/01/19 0745  NA 135 132*  K 3.5 4.0  CL 98 95*  CO2 24 24  GLUCOSE 108* 136*  BUN 42* 54*  CREATININE 3.90* 4.85*  CALCIUM 8.1* 8.6*    Physical Exam: BP 132/82 (BP Location: Right Arm)   Pulse 79   Temp 98.6 F (37 C) (Oral)   Resp 18   Ht 5\' 7"  (1.702 m)   Wt 129.9 kg   SpO2 98%   BMI 44.85 kg/m  Constitutional: No distress . Vital signs and labs reviewed. Pt lying down due to dizziness- appears paler than normal/clammy, No acute distress HEENT: EOMI, oral membranes moist Neck: supple Cardiovascular: RRR without murmur. No JVD    Respiratory: CTA Bilaterally without wheezes or rales. Normal effort    GI: BS +, non-tender, non-distended   Skin: Warm and dry.  Intact. Psych: flat but cooperative Musc: Right wrist with minimal radial tenderness today Right ankle with mild tenderness 2+ edema, mild pain with PROM, Moderate pain with weight bearing  Neurological: Alert. Bilateral upper extremities: 4/5 proximal to distal,   Bilateral lower extremities: 4--4/5 proximal to distal,   Skin: Vascular changes bilateral lower extremities Wearing TEDs- legs elevated   Assessment/Plan: 1. Functional deficits secondary to debility which require 3+ hours per day of interdisciplinary therapy in a comprehensive inpatient rehab setting.  Physiatrist is providing close team  supervision and 24 hour management of active medical problems listed below.  Physiatrist and rehab team continue to assess barriers to discharge/monitor patient progress toward functional and medical goals  Care Tool:  Bathing  Bathing activity did not occur: Safety/medical concerns Body parts bathed by patient: Right arm, Left arm, Chest, Abdomen, Front perineal area, Buttocks, Right upper leg, Left upper leg, Right lower leg, Left lower leg, Face   Body parts bathed by helper: Right arm, Left arm, Face(Did not attempt other parts) Body parts n/a: Buttocks, Right arm, Left arm, Chest, Abdomen   Bathing assist Assist Level: Supervision/Verbal cueing     Upper Body Dressing/Undressing Upper body dressing   What is the patient wearing?: Pull over shirt    Upper body assist Assist Level: Supervision/Verbal cueing    Lower Body Dressing/Undressing Lower body dressing    Lower body dressing activity did not occur: N/A What is the patient wearing?: Underwear/pull up, Pants     Lower body assist Assist for lower body dressing: Supervision/Verbal cueing     Toileting Toileting Toileting Activity did not occur (Clothing management and hygiene only): N/A (no void or bm)  Toileting assist Assist for toileting: Moderate Assistance - Patient 50 - 74%     Transfers Chair/bed transfer  Transfers assist     Chair/bed transfer assist level: Supervision/Verbal cueing Chair/bed transfer assistive device: Other(rollator)   Locomotion Ambulation   Ambulation assist  Assist level: Supervision/Verbal cueing Assistive device: Walker-rolling Max distance: 5'   Walk 10 feet activity   Assist  Walk 10 feet activity did not occur: Safety/medical concerns  Assist level: Minimal Assistance - Patient > 75% Assistive device: Rollator   Walk 50 feet activity   Assist Walk 50 feet with 2 turns activity did not occur: Safety/medical concerns  Assist level: Contact  Guard/Touching assist Assistive device: Walker-rolling    Walk 150 feet activity   Assist Walk 150 feet activity did not occur: Safety/medical concerns  Assist level: Contact Guard/Touching assist Assistive device: Walker-rolling    Walk 10 feet on uneven surface  activity   Assist Walk 10 feet on uneven surfaces activity did not occur: Safety/medical concerns         Wheelchair     Assist Will patient use wheelchair at discharge?: No Type of Wheelchair: Manual    Wheelchair assist level: Supervision/Verbal cueing Max wheelchair distance: 40'    Wheelchair 50 feet with 2 turns activity    Assist        Assist Level: Supervision/Verbal cueing   Wheelchair 150 feet activity     Assist            Medical Problem List and Plan: 1.  Generalized weakness with limitations in self-care secondary to debility- acute on chronic renal failure   Cont CIR PT,OT, plan d/c in am  2.  Antithrombotics: -DVT/anticoagulation:  Pharmaceutical: Heparin             -antiplatelet therapy: On ASA 81 mg daily 3. Pain Management: On gabapentin 3 times daily for neuropathy with hydrocodone on PRN basis             Voltaren gel ordered for right wrist on 9/22--improved  Right knee pain gout medrol ordered  4. Mood: LCSW to follow for evaluation and support             -antipsychotic agents: N/A 5. Neuropsych: This patient is capable of making decisions on his own behalf. 6. Skin/Wound Care: Routine pressure relief measures 7. Fluids/Electrolytes/Nutrition: CM/renal diet with 1200 cc FR.  8.  T2DM with hyperglycemia: Hemoglobin A1c-12.4 and poorly controlled.  Continue Levemir twice daily and use SSI for tighter blood sugar control.  Monitor blood sugars AC/HS.   Levemir increased to 39 twice daily on 9/24  -added meal time scheduled covg 3u tid 9/26  Labile still. Observe for pattern today  -better control 10/4                . CBG (last 3)  Recent Labs     08/01/19 1639 08/01/19 2017 08/02/19 0645  GLUCAP 114* 158* 79  may decrease to 38U levemir BID-am CBG ok , will increase mealtime coverage - hypoglycemia- 9.  Hypertension: Monitor BP TID--follow-up with [name] for work-up to rule out RAS. Continue amlodipine, Coreg twice daily, clonidine TID    Furosemide decreased to daily 20 on 9/22   Vitals:   08/01/19 2237 08/02/19 0525  BP: (!) 143/87 132/82  Pulse: 69 79  Resp:  18  Temp:  98.6 F (37 C)  SpO2: 97% 98%  BP ok no dizziness will cont current meds  10.  Anemia of chronic disease: Received IV iron             Labs with HD  Hemoglobin 9.3 on 9/29 11.  ESRD/chronic combined CHF: Anasarca multifactorial and improving with hemodialysis.  Daily weights  Filed Weights   07/30/19 2108 07/31/19 0500 08/01/19 0504  Weight: 128 kg 129.5 kg 129.9 kg   Stable on 10/3 12.  Right knee pain- gout     Continue Voltaren gel   Brace    Much improved on medrol dosepack 13. RIght ankle swelling , no fever, WBC normal.  Has had gout in this joint in the past, likely recurrent after wean off medrol.  Will cont aloopurinol, give one time IM injection of depo medrol. Will order ankle brace   LOS: 17 days A FACE TO FACE EVALUATION WAS PERFORMED  Charlett Blake 08/02/2019, 8:31 AM

## 2019-08-02 NOTE — Progress Notes (Signed)
Social Work Patient ID: [facility], male   DOB: [birth year], 48 y.o.   MRN: [id number] met with pt who feels ready to go home tomorrow. Equipment to be delivered in am and aware of HD in [facility] appointment Thursday. Going to HD this afternoon.

## 2019-08-02 NOTE — Progress Notes (Signed)
Physical Therapy Discharge Summary  Patient Details  Name: Kelly Key MRN: [id number] Date of Birth: 1970/12/14  Today's Date: 08/02/2019 PT Individual Time: 0805-0904 PT Individual Time Calculation (min): 59 min    Patient has met 6 of 6 long term goals due to improved activity tolerance, improved balance, increased strength, ability to compensate for deficits and improved awareness.  Patient to discharge at an ambulatory level Supervision. Patient's care partner is independent to provide the necessary physical assistance at discharge.  All goals met.  Recommendation:  Patient will benefit from ongoing skilled PT services in home health setting to continue to advance safe functional mobility, address ongoing impairments in B LE strength, standing balance, activity tolerance, gait, and minimize fall risk.  Equipment: 22x18 wheelchair with cushion and pt already has RW  Reasons for discharge: treatment goals met and discharge from hospital  Patient/family agrees with progress made and goals achieved: Yes  Skilled Therapeutic Interventions/Progress Updates:  Patient received sitting in w/c with RN present for medication administration and pt agreeable to therapy session. Therapist noted pt has pitting edema in B feet and ankles - donned TED hose, ACE wrap on R ankle for pain management, and socks total assist for time management. Sit<>stands using RW with supervision throughout therapy session. Ambulated ~143f using RW with supervision and w/c follow in event of fatigue/pain limitation - continues to demonstrate heavy reliance on B UE support due to R ankle pain with antalgic gait pattern as well as associated forward trunk flexion and downward gaze. MD present for morning assessment and notified of pt's B feet edema and R ankle pain. Pt transported remainder of distance to therapy gym in w/c for pain management and energy conservation. Performed ambulatory simulated car transfer (pt's truck  height) using RW with supervision and mod cuing for sequencing of transfer. Ambulated  ~166fup/down ramp using RW with supervision and ~2071fsing RW to mat table with supervision. Supine<>sit on mat table independently. Stand pivot EOM>w/c using RW with supervision. Therapist provided pt with printed HEP to be performed at home including hte following exercises: seated marching, seated long arc quads, and seated ankle pumps. Transported back to room in w/c. Ambulated ~4f25fc>recliner using RW with supervision. Pt left seated in recliner with B LEs elevated, needs in reach, and RN present for medication administration.  PT Discharge Precautions/Restrictions Precautions Precautions: Fall Restrictions Weight Bearing Restrictions: No Pain Pain Assessment Pain Scale: 0-10 Pain Score: 8  Pain Type: Acute pain Pain Location: Ankle Pain Orientation: Right Pain Descriptors / Indicators: Aching Pain Onset: Other (Comment)("when I put pressure on it") Pain Intervention(s): RN made aware;Rest;Repositioned Vision/Perception  Perception Perception: Within Functional Limits Praxis Praxis: Intact  Cognition Overall Cognitive Status: Within Functional Limits for tasks assessed Arousal/Alertness: Awake/alert Orientation Level: Oriented to person;Oriented to place;Oriented to situation(pt initially stating that it is August requiring max cuing to correct to October) Attention: Focused;Sustained Focused Attention: Appears intact Sustained Attention: Appears intact Safety/Judgment: Appears intact Sensation Sensation Light Touch: Appears Intact(pt able to sense light touch screen in B LEs) Hot/Cold: Not tested Proprioception: Appears Intact Stereognosis: Not tested Coordination Gross Motor Movements are Fluid and Coordinated: No Coordination and Movement Description: continues to be limited by generalized weakness and pain Motor  Motor Motor: Other (comment) Motor - Discharge Observations:  generalized weakness  Mobility Bed Mobility Bed Mobility: Supine to Sit;Sit to Supine Supine to Sit: Independent Sit to Supine: Independent Transfers Transfers: Sit to Stand;Stand to Sit;Stand Pivot Transfers Sit to Stand: Supervision/Verbal  cueing Stand to Sit: Supervision/Verbal cueing Stand Pivot Transfers: Supervision/Verbal cueing Transfer (Assistive device): Rolling walker Locomotion  Gait Ambulation: Yes Gait Assistance: Supervision/Verbal cueing Gait Distance (Feet): 100 Feet Assistive device: Rolling walker Gait Gait: Yes Gait Pattern: Impaired Gait Pattern: Decreased step length - left;Decreased step length - right;Decreased stance time - right;Decreased stride length;Poor foot clearance - left;Poor foot clearance - right Gait velocity: decreased Stairs / Additional Locomotion Stairs: No Ramp: Supervision/Verbal cueing Wheelchair Mobility Wheelchair Mobility: No  Trunk/Postural Assessment  Cervical Assessment Cervical Assessment: Within Functional Limits Thoracic Assessment Thoracic Assessment: Exceptions to WFL(thoracic rounding) Lumbar Assessment Lumbar Assessment: Exceptions to WFL(posterior pelvic tilt) Postural Control Postural Control: Deficits on evaluation Postural Limitations: decreased with pt using B UE support on RW for standing balance  Balance Balance Balance Assessed: Yes Static Sitting Balance Static Sitting - Balance Support: Feet supported Static Sitting - Level of Assistance: 7: Independent Dynamic Sitting Balance Dynamic Sitting - Balance Support: Feet supported Dynamic Sitting - Level of Assistance: 6: Modified independent (Device/Increase time) Static Standing Balance Static Standing - Balance Support: During functional activity;Bilateral upper extremity supported Static Standing - Level of Assistance: 5: Stand by assistance Dynamic Standing Balance Dynamic Standing - Balance Support: During functional activity;Bilateral upper  extremity supported Dynamic Standing - Level of Assistance: 5: Stand by assistance Extremity Assessment      RLE Assessment RLE Assessment: Exceptions to [facility] Active Range of Motion (AROM) Comments: limited ankle AROM secondary to edema General Strength Comments: generalized weakness grossly 3+ to 4/5 at hip/knee with some pain limitations, 3-/5 at ankle LLE Assessment LLE Assessment: Exceptions to [facility] Active Range of Motion (AROM) Comments: limited ankle AROM secondary to edema General Strength Comments: generalized weakness grossly 4/5 in hip/knee, 3-/5 in ankle    Tawana Scale, PT, DPT 08/02/2019, 7:51 AM

## 2019-08-02 NOTE — Progress Notes (Signed)
Occupational Therapy Session Note  Patient Details  Name: Kelly Key MRN: [id number] Date of Birth: 1971/05/09  Today's Date: 08/02/2019 OT Individual Time: 2094-7096 OT Individual Time Calculation (min): 70 min    Short Term Goals: Week 1:  OT Short Term Goal 1 (Week 1): Pt will don pants with min A OT Short Term Goal 1 - Progress (Week 1): Not met OT Short Term Goal 2 (Week 1): Pt will complete sit <> stand with min A OT Short Term Goal 2 - Progress (Week 1): Not met OT Short Term Goal 3 (Week 1): Pt will transfer to [facility] with min A OT Short Term Goal 3 - Progress (Week 1): Not met OT Short Term Goal 4 (Week 1): Pt will demonstrate increased endurance and stand for 1 grooming activity with min A OT Short Term Goal 4 - Progress (Week 1): Not met Week 2:  OT Short Term Goal 1 (Week 2): Pt will transfer to [facility] with min A OT Short Term Goal 1 - Progress (Week 2): Met OT Short Term Goal 2 (Week 2): Pt will complete sit <> stand with min A OT Short Term Goal 2 - Progress (Week 2): Met OT Short Term Goal 3 (Week 2): Pt will don pants with min A OT Short Term Goal 3 - Progress (Week 2): Met OT Short Term Goal 4 (Week 2): Pt will demonstrate increased endurance and stand for 1 grooming activity with min A OT Short Term Goal 4 - Progress (Week 2): Progressing toward goal Week 3:  OT Short Term Goal 1 (Week 3): Pt will demonstrate increased endurance and stand for 1 grooming activity with min A OT Short Term Goal 2 (Week 3): STG = LTGs due to remaining LOS  Skilled Therapeutic Interventions/Progress Updates:    Pt received in recliner agreeable to therapy and discussed his current ankle pain.  To avoid increasing pain, pt opted to do transfers from recliner to w/c to tub bench with RW versus ambulating to bathroom with RW.  He needed to move slowly and take breaks to avoid inducing more pain.    ADL Retraining: used tub bench in shower for LB bathing, covered Picc line with shower shield. Pt  able to bathe independently in shower.  He then transferred back to w.c (he stated he completed UB self care earlier this morning).  He then transferred stand pivot to bed to complete dressing.  Donned clothing independently but did need set up with TED hose and R ankle brace.    Transfers: S to stand to RW, S with RW to w/c and S with grab bars  Balance:  S with RW with clothing management in standing  Pt opted to lay back down for 15 minutes until his next therapy.  Pt in room with all needs met and alarm set.    Therapy Documentation Precautions:  Precautions Precautions: Fall Restrictions Weight Bearing Restrictions: No Vital Signs: Therapy Vitals BP: 140/90 Patient Position (if appropriate): Sitting Pain: Pain Assessment Pain Scale: 0-10 Pain Score: 5  Pain Type: Acute pain Pain Location: Ankle Pain Orientation: Right Pain Descriptors / Indicators: Aching Pain Onset: On-going Patients Stated Pain Goal: 3 Pain Intervention(s): Shower      Therapy/Group: Individual Therapy  [place] 08/02/2019, 10:16 AM

## 2019-08-02 NOTE — Progress Notes (Signed)
Renal Navigator received call from CIR CSW/B. Dupree confirming that patient is scheduled for discharge tomorrow, 08/03/19. Patient is set to start at OP HD clinic/[facility] in [place] and needs to report to clinic on Thursday, 08/04/19 at 10:30am to complete consent forms/intake prior to his first treatment. Renal Navigator will check in with patient today in HD unit, and asked CSW to call Navigator if patient has any specific questions about OP HD treatment. Renal Navigator notified OP HD clinic of patient's planned discharge tomorrow and start date on 08/04/19.  [name], [place] Renal Navigator [phone]

## 2019-08-02 NOTE — Progress Notes (Signed)
Occupational Therapy Discharge Summary  Patient Details  Name: Kelly Key MRN: [id number] Date of Birth: 1971/03/09  Today's Date: 08/02/2019 OT Individual Time: 1100-1200 OT Individual Time Calculation (min): 60 min    Session Note:  Pt transferred from supine to sit EOB with independence.  Once sitting, he used the RW for support to transfer to the wheelchair with close supervision.  Therapist took him down to the therapy gym where he utilized the UE ergonometer for 2 sets of 8 mins.  Program was set on Random with resistance placed on level 7.  He completed 1st set peddling forward with RPMs maintained at 25-30.  Second set was for peddling backwards with RPMs at the same level.  Finished session with return to the room with call button and phone in reach and safety alarm belt in place.    Patient has met 8 of 8 long term goals due to improved activity tolerance and ability to compensate for deficits.  Patient to discharge at overall Supervision level.  Patient's care partner is independent to provide the necessary physical assistance at discharge.    Reasons goals not met: NA  Recommendation:  Patient will benefit from ongoing skilled OT services in home health setting to continue to advance functional skills in the area of BADL, iADL and Reduce care partner burden.  Pt has made steady progress with OT at this time, however, he still demonstrates increased fluid retention at times with increased pain in his right ankle.  Feel this will likely resolve soon with treatment.  Recommend continued HHOT progression to reach independent/modified independent level for home as well as increasing functional endurance.    Equipment: tub bench  Reasons for discharge: treatment goals met and discharge from hospital  Patient/family agrees with progress made and goals achieved: Yes  OT Discharge Precautions/Restrictions  Precautions Precautions: Fall Restrictions Weight Bearing Restrictions: No    Vital Signs Therapy Vitals BP: 140/90 Patient Position (if appropriate): Sitting Pain Pain Assessment Pain Scale: Faces Pain Score: 5  Faces Pain Scale: Hurts a little bit Pain Type: Acute pain Pain Location: Ankle Pain Orientation: Right Pain Descriptors / Indicators: Aching Pain Onset: On-going Patients Stated Pain Goal: 3 Pain Intervention(s): Shower ADL ADL Eating: Independent Where Assessed-Eating: Edge of bed Grooming: Modified independent Where Assessed-Grooming: Wheelchair Upper Body Bathing: Supervision/safety Where Assessed-Upper Body Bathing: Wheelchair Lower Body Bathing: Supervision/safety Where Assessed-Lower Body Bathing: Wheelchair Upper Body Dressing: Independent Where Assessed-Upper Body Dressing: Wheelchair Lower Body Dressing: Supervision/safety Where Assessed-Lower Body Dressing: Wheelchair Toileting: Supervision/safety Where Assessed-Toileting: Glass blower/designer: Close supervision Toilet Transfer Method: Counselling psychologist: Raised toilet seat Tub/Shower Transfer: Close supervison Clinical cytogeneticist Method: Librarian, academic: Facilities manager: Close supervision Social research officer, government Method: Radiographer, therapeutic: Gaffer Baseline Vision/History: No visual deficits Patient Visual Report: Blurring of vision Vision Assessment?: Yes Ocular Range of Motion: Within Functional Limits Alignment/Gaze Preference: Within Defined Limits Tracking/Visual Pursuits: Able to track stimulus in all quads without difficulty Saccades: Within functional limits Convergence: Within functional limits Perception  Perception: Within Functional Limits Praxis Praxis: Intact Cognition Overall Cognitive Status: Within Functional Limits for tasks assessed Arousal/Alertness: Awake/alert Orientation Level: Oriented to person;Oriented to place;Oriented to  situation Attention: Focused;Sustained Focused Attention: Appears intact Sustained Attention: Appears intact Selective Attention: Appears intact Memory: Appears intact Safety/Judgment: Appears intact Sensation Sensation Light Touch: Appears Intact Hot/Cold: Appears Intact Proprioception: Appears Intact Stereognosis: Appears Intact Additional Comments: BUE sensation WFLs Coordination Gross  Motor Movements are Fluid and Coordinated: Yes Fine Motor Movements are Fluid and Coordinated: Yes(BUE coordination and strength WFLS for selfcare tasks and ADLs) Motor  Motor Motor - Discharge Observations: generalized weakness Mobility  Bed Mobility Bed Mobility: Supine to Sit Supine to Sit: Independent Transfers Sit to Stand: Independent with assistive device Stand to Sit: Independent with assistive device  Trunk/Postural Assessment  Cervical Assessment Cervical Assessment: Within Functional Limits Thoracic Assessment Thoracic Assessment: Exceptions to WFL(slight thoracic rounding) Lumbar Assessment Lumbar Assessment: Exceptions to WFL(posterior pelvic tilt)  Balance Static Sitting Balance Static Sitting - Balance Support: Feet supported Static Sitting - Level of Assistance: 7: Independent Dynamic Sitting Balance Dynamic Sitting - Balance Support: Feet supported Dynamic Sitting - Level of Assistance: 6: Modified independent (Device/Increase time) Static Standing Balance Static Standing - Balance Support: During functional activity Static Standing - Level of Assistance: 5: Stand by assistance Dynamic Standing Balance Dynamic Standing - Balance Support: During functional activity Dynamic Standing - Level of Assistance: 5: Stand by assistance Extremity/Trunk Assessment RUE Assessment RUE Assessment: Exceptions to [facility] General Strength Comments: Strength overall 4/5 throughout LUE Assessment LUE Assessment: Exceptions to [facility] General Strength Comments: strength 4/5  throughout   Deanna Wiater OTR/L 08/02/2019, 4:00 PM

## 2019-08-02 NOTE — Progress Notes (Signed)
Orthopedic Tech Progress Note Patient Details:  Kelly Key 06-Dec-1970 [id number] RN said patient had therapy within the next 10 mins and the patient was icing up his ankle, could I allow therapy to apply ASO once patient was in therapy.  Ortho Devices Type of Ortho Device: Ankle splint Ortho Device/Splint Location: LRE Ortho Device/Splint Interventions: Other (comment)   Post Interventions Patient Tolerated: Other (comment) Instructions Provided: Other (comment)   [name] 08/02/2019, 9:38 AM

## 2019-08-02 NOTE — Progress Notes (Signed)
Social Work Discharge Note   The overall goal for the admission was met for:   Discharge location: Yes-home with fiance and sister to assist with care  Length of Stay: Yes-21 days  Discharge activity level: Yes-supervision with cues  Home/community participation: Yes  Services provided included: MD, RD, PT, OT, RN, CM, TR, Pharmacy, Neuropsych and SW  Financial Services: Medicare  Follow-up services arranged: Home Health: [facility]-pt & ot, DME: [facility]-wheelchair & tub bench and Patient/Family has no preference for HH/DME agencies  Comments (or additional information):bot sister and fiance will assist pt at discharge. He has done well here and met his goals. Safe to be home alone for a few hours. Aware of HD appointment 10/8 @ 10:30 am [facility]  Patient/Family verbalized understanding of follow-up arrangements: Yes  Individual responsible for coordination of the follow-up plan: self & renessa-fiance  Confirmed correct DME delivered: Elease Hashimoto 08/02/2019    Elease Hashimoto

## 2019-08-02 NOTE — Progress Notes (Signed)
Patient ID: Kelly Key, male   DOB: 01-Sep-1971, 48 y.o.   MRN: [id number]  [facility] Progress Note   Assessment/ Plan:   1.  Generalized weakness/debilitation following hospitalization and acute on chronic renal failure.  Resolved right knee pain (from acute gout) with Medrol taper/topical Voltaren.  2. ESRD: With proteinuric chronic kidney disease stage IV and progression over the recent hospitalizations - multiple admissions with overload now to requiring hemodialysis for ESRD.  - nonoliguric; has been maintained on HD for clearance  - spoke with HD coordinator/SW; he has been clipped to [facility] and can start there after discharge later this week.  HD SW is coming to discuss time and location with him  - Given length of need and clearance feel that he is ESRD - HD today per TTS schedule.     3. Anemia of CKD.  Continue ESA  4. CKD-MBD: Calcium level acceptable with phosphorus at goal on current doses of sevelamer/HD   5. Nutrition: Continue renal diet, renal multivitamin and Nepro.   6. Hypertension:  Continue current regimen.    Subjective:   Seen and examined on HD at 2:40pm on 10/6 and procedure supervised.  HR 84 and BP 157/81.  Had 1.3 L UOP over 10/5.  143/87 on repeat.  Tolerating 2 kg goal.    Review of systems:  Denies shortness of breath  Denies n/v No cp Eating well    Objective:   BP 140/90   Pulse 79   Temp 98.6 F (37 C) (Oral)   Resp 18   Ht 5\' 7"  (1.702 m)   Wt 129.9 kg   SpO2 98%   BMI 44.85 kg/m   Physical Exam: Gen: adult male in bed in NAD  CVS: Pulse regular rhythm, normal rate, S1 and S2 normal Resp: Clear to auscultation bilaterally, no rales or rhonchi Abd: Soft, obese, nontender Ext: trace bilateral lower extremity edema  Neuro - alert and oriented x 3 Access: RIJ tunneled dialysis catheter   Labs: BMET Recent Labs  Lab 07/27/19 0757 07/28/19 0659 07/29/19 0639 07/30/19 0609 07/31/19 0557 08/01/19 0745  08/02/19 1318  NA 134* 134* 136 134* 135 132* 131*  K 4.0 3.9 3.9 4.0 3.5 4.0 4.2  CL 95* 96* 94* 97* 98 95* 95*  CO2 24 26 27 22 24 24 22   GLUCOSE 264* 141* 97 112* 108* 136* 177*  BUN 40* 60* 71* 81* 42* 54* 68*  CREATININE 4.23* 4.97* 5.35* 5.75* 3.90* 4.85* 5.82*  CALCIUM 8.7* 8.7* 8.7* 8.7* 8.1* 8.6* 8.4*  PHOS  --  4.9* 5.0* 5.3* 4.1 4.4 5.2*   CBC Recent Labs  Lab 07/26/19 1539 07/30/19 1710 08/01/19 0745 08/02/19 1318  WBC 12.0* 8.5 9.2 9.2  NEUTROABS  --   --  4.6  --   HGB 9.3* 9.6* 10.2* 9.1*  HCT 29.6* 31.5* 33.0* 29.6*  MCV 78.3* 78.6* 78.4* 80.0  PLT 404* 367 384 355   Medications:    . allopurinol  200 mg Oral Daily  . amLODipine  5 mg Oral QHS  . aspirin EC  81 mg Oral Daily  . atorvastatin  80 mg Oral Daily  . carvedilol  25 mg Oral BID WC  . Chlorhexidine Gluconate Cloth  6 each Topical Q0600  . cloNIDine  0.1 mg Oral TID  . darbepoetin (ARANESP) injection - DIALYSIS  60 mcg Intravenous Q Fri-HD  . diclofenac sodium  2 g Topical QID  . ezetimibe  10  mg Oral QPM  . fluticasone  1 spray Each Nare Daily  . gabapentin  100 mg Oral TID  . heparin  5,000 Units Subcutaneous Q8H  . insulin aspart  0-20 Units Subcutaneous TID WC  . insulin aspart  0-5 Units Subcutaneous QHS  . insulin aspart  3 Units Subcutaneous TID WC  . insulin detemir  38 Units Subcutaneous BID  . linaclotide  145 mcg Oral QAC breakfast  . multivitamin  1 tablet Oral QHS  . sevelamer carbonate  2,400 mg Oral TID WC   Claudia Desanctis 08/02/2019, 2:39 PM

## 2019-08-03 DIAGNOSIS — M109 Gout, unspecified: Secondary | ICD-10-CM

## 2019-08-03 HISTORY — DX: Gout, unspecified: M10.9

## 2019-08-03 LAB — GLUCOSE, CAPILLARY
Glucose-Capillary: 94 mg/dL (ref 70–99)
Glucose-Capillary: 110 mg/dL — ABNORMAL HIGH (ref 70–99)

## 2019-08-03 MED ORDER — RENA-VITE PO TABS: 1.0000 | ORAL_TABLET | Freq: Every day | ORAL | 0 refills | Status: DC

## 2019-08-03 MED ORDER — CARVEDILOL 25 MG PO TABS: 25.0000 mg | ORAL_TABLET | Freq: Two times a day (BID) | ORAL | 0 refills | Status: DC

## 2019-08-03 MED ORDER — ACETAMINOPHEN 325 MG PO TABS: 325.0000 mg | ORAL_TABLET | ORAL | Status: DC | PRN

## 2019-08-03 MED ORDER — ALLOPURINOL 100 MG PO TABS: 200.0000 mg | ORAL_TABLET | Freq: Every day | ORAL | 0 refills | Status: AC

## 2019-08-03 MED ORDER — DICLOFENAC SODIUM 1 % TD GEL: 2.0000 g | Freq: Four times a day (QID) | TRANSDERMAL | Status: DC

## 2019-08-03 MED ORDER — INSULIN DETEMIR 100 UNIT/ML ~~LOC~~ SOLN: 38.0000 [IU] | Freq: Two times a day (BID) | SUBCUTANEOUS | 1 refills | Status: DC

## 2019-08-03 MED ORDER — CLONIDINE HCL 0.3 MG PO TABS: 0.3000 mg | ORAL_TABLET | Freq: Three times a day (TID) | ORAL | 0 refills | Status: DC

## 2019-08-03 MED ORDER — ASPIRIN 81 MG PO TBEC: 81.0000 mg | DELAYED_RELEASE_TABLET | Freq: Every day | ORAL | 0 refills | Status: DC

## 2019-08-03 MED ORDER — AMLODIPINE BESYLATE 5 MG PO TABS: 5.0000 mg | ORAL_TABLET | Freq: Every day | ORAL | 0 refills | Status: DC

## 2019-08-03 MED ORDER — HYDROCODONE-ACETAMINOPHEN 5-325 MG PO TABS: 1.0000 | ORAL_TABLET | Freq: Two times a day (BID) | ORAL | 0 refills | Status: DC | PRN

## 2019-08-03 MED ORDER — SEVELAMER CARBONATE 800 MG PO TABS: 2400.0000 mg | ORAL_TABLET | Freq: Three times a day (TID) | ORAL | 0 refills | Status: DC

## 2019-08-03 MED ORDER — ATORVASTATIN CALCIUM 80 MG PO TABS: 80.0000 mg | ORAL_TABLET | Freq: Every day | ORAL | 0 refills | Status: DC

## 2019-08-03 MED ORDER — GABAPENTIN 100 MG PO CAPS: 100.0000 mg | ORAL_CAPSULE | Freq: Three times a day (TID) | ORAL | 0 refills | Status: AC

## 2019-08-03 NOTE — Plan of Care (Addendum)
Pt discharging to home with personal property. Discharge instructions provided to pt and other with vebalizing an understanding of medications, follow up appts, and able to instruct on checking blood pressure as well.

## 2019-08-03 NOTE — Progress Notes (Signed)
[facility] PHYSICAL MEDICINE & REHABILITATION PROGRESS NOTE  Subjective/Complaints:   Patient is more accepting of the fact that he is now on dialysis.  He still holds out the hope that he may be able to get off of dialysis at some point.  We discussed diet including renal restrictions as well as reducing purines given his gout history His ankle is doing a little bit better on the right side his knee is not painful.  He is now wearing a ankle brace and has received his Depo-Medrol injection  ROS: Patient denies, nausea, vomiting, diarrhea, cough, shortness of breath or chest pain,     Objective: Vital Signs: Blood pressure 138/74, pulse 92, temperature 97.9 F (36.6 C), resp. rate 19, height 5\' 7"  (1.702 m), weight 131 kg, SpO2 99 %. No results found. Recent Labs    08/01/19 0745 08/02/19 1318  WBC 9.2 9.2  HGB 10.2* 9.1*  HCT 33.0* 29.6*  PLT 384 355   Recent Labs    08/01/19 0745 08/02/19 1318  NA 132* 131*  K 4.0 4.2  CL 95* 95*  CO2 24 22  GLUCOSE 136* 177*  BUN 54* 68*  CREATININE 4.85* 5.82*  CALCIUM 8.6* 8.4*    Physical Exam: BP 138/74 (BP Location: Right Arm)   Pulse 92   Temp 97.9 F (36.6 C)   Resp 19   Ht 5\' 7"  (1.702 m)   Wt 131 kg   SpO2 99%   BMI 45.23 kg/m  Constitutional: No distress . Vital signs and labs reviewed. Pt lying down due to dizziness- appears paler than normal/clammy, No acute distress HEENT: EOMI, oral membranes moist Neck: supple Cardiovascular: RRR without murmur. No JVD    Respiratory: CTA Bilaterally without wheezes or rales. Normal effort    GI: BS +, non-tender, non-distended   Skin: Warm and dry.  Intact. Psych: flat but cooperative Musc: Right wrist with minimal radial tenderness today Right ankle with mild tenderness 2+ edema, mild pain with PROM, Moderate pain with weight bearing  Neurological: Alert. Bilateral upper extremities: 4/5 proximal to distal,   Bilateral lower extremities: 4--4/5 proximal to distal,    Skin: Vascular changes bilateral lower extremities Wearing TEDs- legs elevated   Assessment/Plan: 1. Functional deficits secondary to debility  Stable for D/C today F/u PCP in 3-4 weeks F/u PM&R 2 weeks-Patel See D/C summary See D/C instructions Care Tool:  Bathing  Bathing activity did not occur: Safety/medical concerns Body parts bathed by patient: Right arm, Left arm, Chest, Abdomen, Front perineal area, Buttocks, Right upper leg, Left upper leg, Right lower leg, Left lower leg, Face   Body parts bathed by helper: Right arm, Left arm, Face(Did not attempt other parts) Body parts n/a: Buttocks, Right arm, Left arm, Chest, Abdomen   Bathing assist Assist Level: Set up assist     Upper Body Dressing/Undressing Upper body dressing   What is the patient wearing?: Pull over shirt    Upper body assist Assist Level: Independent    Lower Body Dressing/Undressing Lower body dressing    Lower body dressing activity did not occur: N/A What is the patient wearing?: Underwear/pull up, Pants     Lower body assist Assist for lower body dressing: Independent     Toileting Toileting Toileting Activity did not occur (Clothing management and hygiene only): N/A (no void or bm)  Toileting assist Assist for toileting: Supervision/Verbal cueing     Transfers Chair/bed transfer  Transfers assist     Chair/bed transfer assist level:  Supervision/Verbal cueing Chair/bed transfer assistive device: Museum/gallery exhibitions officer assist      Assist level: Supervision/Verbal cueing Assistive device: Walker-rolling Max distance: 139ft   Walk 10 feet activity   Assist  Walk 10 feet activity did not occur: Safety/medical concerns  Assist level: Supervision/Verbal cueing Assistive device: Walker-rolling   Walk 50 feet activity   Assist Walk 50 feet with 2 turns activity did not occur: Safety/medical concerns  Assist level: Supervision/Verbal  cueing Assistive device: Walker-rolling    Walk 150 feet activity   Assist Walk 150 feet activity did not occur: Safety/medical concerns(limited distance 2/2 pain)  Assist level: Contact Guard/Touching assist Assistive device: Walker-rolling    Walk 10 feet on uneven surface  activity   Assist Walk 10 feet on uneven surfaces activity did not occur: Safety/medical concerns   Assist level: Supervision/Verbal cueing(up/down curb) Assistive device: Aeronautical engineer Will patient use wheelchair at discharge?: (w/c to be used for dependent community mobility) Type of Wheelchair: Manual    Wheelchair assist level: Supervision/Verbal cueing Max wheelchair distance: 40'    Wheelchair 50 feet with 2 turns activity    Assist        Assist Level: Supervision/Verbal cueing   Wheelchair 150 feet activity     Assist            Medical Problem List and Plan: 1.  Generalized weakness with limitations in self-care secondary to debility- acute on chronic renal failure   Cont CIR PT,OT, plan d/c today   2.  Antithrombotics: -DVT/anticoagulation:  Pharmaceutical: Heparin             -antiplatelet therapy: On ASA 81 mg daily 3. Pain Management: On gabapentin 3 times daily for neuropathy with hydrocodone on PRN basis             Voltaren gel ordered for right wrist on 9/22--improved  Right knee pain gout medrol ordered  4. Mood: LCSW to follow for evaluation and support             -antipsychotic agents: N/A 5. Neuropsych: This patient is capable of making decisions on his own behalf. 6. Skin/Wound Care: Routine pressure relief measures 7. Fluids/Electrolytes/Nutrition: CM/renal diet with 1200 cc FR.  8.  T2DM with hyperglycemia: Hemoglobin A1c-12.4 and poorly controlled.  Continue Levemir twice daily and use SSI for tighter blood sugar control.  Monitor blood sugars AC/HS.   Levemir increased to 39 twice daily on 9/24  -added meal time  scheduled covg 3u tid 9/26  Labile still. Observe for pattern today  -better control 10/4                . CBG (last 3)  Recent Labs    08/02/19 1815 08/02/19 2036 08/03/19 0615  GLUCAP 92 240* 94  may decrease to 38U levemir BID-am CBG ok , will increase mealtime coverage - hypoglycemia- 9.  Hypertension: Monitor BP TID--follow-up with [name] for work-up to rule out RAS. Continue amlodipine, Coreg twice daily, clonidine TID    Furosemide decreased to daily 20 on 9/22   Vitals:   08/02/19 2001 08/03/19 0544  BP: (!) 151/91 138/74  Pulse: 88 92  Resp: 19 19  Temp: 98 F (36.7 C) 97.9 F (36.6 C)  SpO2: 98% 99%  BP ok no dizziness will cont current meds  10.  Anemia of chronic disease: Received IV iron  Labs with HD  Hemoglobin 9.3 on 9/29 11.  ESRD/chronic combined CHF: Anasarca multifactorial and improving with hemodialysis.             Daily weights  Filed Weights   08/01/19 0504 08/02/19 1335 08/02/19 1735  Weight: 129.9 kg 132.6 kg 131 kg   Stable on 10/6 12.  Right knee pain- gout     Continue Voltaren gel   Brace    Much improved on medrol dosepack 13. RIght ankle swelling , no fever, WBC normal.  Has had gout in this joint in the past, likely recurrent after wean off medrol.  Will cont aloopurinol, give one time IM injection of depo medrol. Will order ankle brace   LOS: 18 days A FACE TO FACE EVALUATION WAS PERFORMED  Charlett Blake 08/03/2019, 9:10 AM

## 2019-08-03 NOTE — Discharge Instructions (Signed)
Inpatient Rehab Discharge Instructions  KERNEY NIHART Discharge date and time: 08/03/19    Activities/Precautions/ Functional Status: Activity: no lifting, driving, or strenuous exercise till cleared by MD Diet: diabetic diet and renal diet--Limit fluids to 5 cups a day. Wound Care: keep wound clean and dry   Functional status:  ___ No restrictions     ___ Walk up steps independently _X__ 24/7 supervision/assistance   ___ Walk up steps with assistance ___ Intermittent supervision/assistance  ___ Bathe/dress independently ___ Walk with walker     _X__ Bathe/dress with assistance ___ Walk Independently    ___ Shower independently ___ Walk with assistance    ___ Shower with assistance _X__ No alcohol     ___ Return to work/school ________  Special Instructions: 1. Check blood sugars before meals and use sliding scale as per home regimen. Check blood sugar at bedtime and if blood sugar is below 120 eat a small snack.  2. Need to check weight daily--if it goes up its a sign that fluid is building up. Limit fluids to 5 cups a day. See dietary restrictions below.    COMMUNITY REFERRALS UPON DISCHARGE:    Home Health:   PT, OT, RN   [place]  Date of last service:08/03/2019  Medical Equipment/Items [place]  Agency/Supplier:[facility] [employer]   [phone]  Other:NEW TO [facility]YSIS[facility] APPOINTMENT Thursday @ 10:30 AM    My questions have been answered and I understand these instructions. I will adhere to these goals and the provided educational materials after my discharge from the hospital.  Patient/Caregiver Signature _______________________________ Date __________  Clinician Signature _______________________________________ Date __________  Please bring this form and your medication list with you to all your follow-up doctor's appointments.

## 2019-08-03 NOTE — Progress Notes (Signed)
[facility] Progress Note   Subjective: Sitting on side of bed, says he only wants to go once a week to HD because he is praying for his kidney function to return. Plans for DC today.    Objective Vitals:   08/02/19 1830 08/02/19 1921 08/02/19 2001 08/03/19 0544  BP: (!) 156/109 (!) 115/45 (!) 151/91 138/74  Pulse:  77 88 92  Resp:  18 19 19   Temp:  98.6 F (37 C) 98 F (36.7 C) 97.9 F (36.6 C)  TempSrc:   Oral   SpO2:  96% 98% 99%  Weight:      Height:       Physical Exam General: Obese male in NAD Heart: S1,S2 RRR Lungs: CTAB Abdomen: Obese active BS Extremities: Trace to 1+ BLE pitting edema.  Dialysis Access: [facility]   Additional Objective Labs: Basic Metabolic Panel: Recent Labs  Lab 07/31/19 0557 08/01/19 0745 08/02/19 1318  NA 135 132* 131*  K 3.5 4.0 4.2  CL 98 95* 95*  CO2 24 24 22   GLUCOSE 108* 136* 177*  BUN 42* 54* 68*  CREATININE 3.90* 4.85* 5.82*  CALCIUM 8.1* 8.6* 8.4*  PHOS 4.1 4.4 5.2*   Liver Function Tests: Recent Labs  Lab 07/31/19 0557 08/01/19 0745 08/02/19 1318  ALBUMIN 2.5* 2.6* 2.4*   No results for input(s): LIPASE, AMYLASE in the last 168 hours. CBC: Recent Labs  Lab 07/30/19 1710 08/01/19 0745 08/02/19 1318  WBC 8.5 9.2 9.2  NEUTROABS  --  4.6  --   HGB 9.6* 10.2* 9.1*  HCT 31.5* 33.0* 29.6*  MCV 78.6* 78.4* 80.0  PLT 367 384 355   Blood Culture    Component Value Date/Time   SDES JOINT FLUID 02/20/2019 0942   SDES JOINT FLUID BOTTLES DRAWN AEROBIC AND ANAEROBIC 02/20/2019 0942   SPECREQUEST Immunocompromised 02/20/2019 0942   SPECREQUEST 10CC 02/20/2019 0942   CULT  02/20/2019 0942    NO GROWTH 6 DAYS Performed at [facility], [address], [place], [place] [address]    REPTSTATUS 02/20/2019 FINAL 02/20/2019 0942   REPTSTATUS 02/26/2019 FINAL 02/20/2019 0942    Cardiac Enzymes: No results for input(s): CKTOTAL, CKMB, CKMBINDEX, TROPONINI in the last 168 hours. CBG: Recent  Labs  Lab 08/02/19 0645 08/02/19 1209 08/02/19 1815 08/02/19 2036 08/03/19 0615  GLUCAP 79 70 92 240* 94   Iron Studies: No results for input(s): IRON, TIBC, TRANSFERRIN, FERRITIN in the last 72 hours. @lablastinr3 @ Studies/Results: No results found. Medications:  . allopurinol  200 mg Oral Daily  . amLODipine  5 mg Oral QHS  . aspirin EC  81 mg Oral Daily  . atorvastatin  80 mg Oral Daily  . carvedilol  25 mg Oral BID WC  . Chlorhexidine Gluconate Cloth  6 each Topical Q0600  . cloNIDine  0.1 mg Oral TID  . darbepoetin (ARANESP) injection - DIALYSIS  60 mcg Intravenous Q Fri-HD  . diclofenac sodium  2 g Topical QID  . ezetimibe  10 mg Oral QPM  . fluticasone  1 spray Each Nare Daily  . gabapentin  100 mg Oral TID  . heparin  5,000 Units Subcutaneous Q8H  . insulin aspart  0-20 Units Subcutaneous TID WC  . insulin aspart  0-5 Units Subcutaneous QHS  . insulin aspart  3 Units Subcutaneous TID WC  . insulin detemir  38 Units Subcutaneous BID  . linaclotide  145 mcg Oral QAC breakfast  . multivitamin  1 tablet Oral QHS  .  sevelamer carbonate  2,400 mg Oral TID WC     1.  Generalized weakness/debilitation following hospitalization and acute on chronic renal failure.  Resolved right knee pain (from acute gout) with Medrol taper/topical Voltaren.  2. ESRD: With proteinuric chronic kidney disease stage IV and progression over the recent hospitalizations - multiple admissions with overload now to requiring hemodialysis for ESRD. 1st HD 07/08/2019.  - nonoliguric; has been maintained on HD for clearance  - spoke with HD coordinator/SW; he has been clipped to [facility] and can start there after discharge later this week.  HD SW is coming to discuss time and location with him  - Given length of need and clearance feel that he is ESRD - HD today per TTS schedule.     3. Anemia of CKD.  Continue ESA  4. CKD-MBD: Calcium level acceptable with phosphorus at goal on current  doses of sevelamer/HD   5. Nutrition: Continue renal diet, renal multivitamin and Nepro.   6. Hypertension:  Continue current regimen.   Disposition: Clipped to [facility]. Has refused placement of permanent access.   Wilhelm Ganaway H. Ashika Apuzzo NP-C 08/03/2019, 10:30 AM  [employer] [phone]

## 2019-08-03 NOTE — Discharge Summary (Signed)
Physician Discharge Summary  Patient ID: Kelly Key MRN: [id number] DOB/AGE: 05-05-1971 48 y.o.  Admit date: 07/16/2019 Discharge date: 08/03/2019  Discharge Diagnoses:  Principal Problem:   Physical debility Active Problems:   ESRD on dialysis ([place])   Anemia of chronic disease   Uncontrolled type 2 diabetes mellitus with hyperglycemia (HCC)   Pain   Muscle strain of right wrist   Orthostasis   Acute gout of right knee   Discharged Condition: Stable   Significant Diagnostic Studies: Dg Wrist Complete Right  Result Date: 07/17/2019 CLINICAL DATA:  Pain, swelling, history of gout, no injury EXAM: RIGHT WRIST - COMPLETE 3+ VIEW COMPARISON:  None. FINDINGS: No fracture or dislocation of the right wrist. The carpus is normally aligned. No significant arthritic change. There is no obvious bony erosion or sclerosis to suggest erosive arthropathy. Soft tissue edema about the wrist. IMPRESSION: No fracture or dislocation of the right wrist. The carpus is normally aligned. No significant arthritic change. There is no obvious bony erosion or sclerosis to suggest erosive arthropathy. Soft tissue edema about the wrist. Electronically Signed   By: Eddie Candle M.D.   On: 07/17/2019 17:37    Labs:  Basic Metabolic Panel: Recent Labs  Lab 07/28/19 0659 07/29/19 0639 07/30/19 0609 07/31/19 0557 08/01/19 0745 08/02/19 1318  NA 134* 136 134* 135 132* 131*  K 3.9 3.9 4.0 3.5 4.0 4.2  CL 96* 94* 97* 98 95* 95*  CO2 26 27 22 24 24 22   GLUCOSE 141* 97 112* 108* 136* 177*  BUN 60* 71* 81* 42* 54* 68*  CREATININE 4.97* 5.35* 5.75* 3.90* 4.85* 5.82*  CALCIUM 8.7* 8.7* 8.7* 8.1* 8.6* 8.4*  PHOS 4.9* 5.0* 5.3* 4.1 4.4 5.2*    CBC: Recent Labs  Lab 07/30/19 1710 08/01/19 0745 08/02/19 1318  WBC 8.5 9.2 9.2  NEUTROABS  --  4.6  --   HGB 9.6* 10.2* 9.1*  HCT 31.5* 33.0* 29.6*  MCV 78.6* 78.4* 80.0  PLT 367 384 355    CBG: Recent Labs  Lab 08/02/19 1209 08/02/19 1815 08/02/19 2036  08/03/19 0615 08/03/19 1056  GLUCAP 70 92 240* 94 110*    Brief HPI:   Kelly Key is a 48 y.o. male with history of T2DM-poorly controlled, malignant HTN, morbid obesity, CKD IV with recent admission for acute on chronic renal failure with anasarca with poor results with aggressive diuresis. HD recommended but patient adamantly declined this and was discharged to home on oral diuretics. He was readmitted on 07/03/19 with worsening of symptoms and was ultimately agreeable to start HD. Tunneled catheter placed on 07/08/19 and HD initiated.  Labile blood pressures treated with medication adjustment and CHF/anasarca improving with HD. He has refused placement of permanent HD catheter. Activity tolerance improving and CIR recommended due to debility.    Hospital Course: ZANDRE CARANCI was admitted to rehab 07/16/2019 for inpatient therapies to consist of PT and OT at least three hours five days a week. Past admission physiatrist, therapy team and rehab RN have worked together to provide customized collaborative inpatient rehab. HD has been ongoing on TTS at end of the day to help with tolerance of therapy. Blood pressures have been monitored on TID basis and noted to be lower on HD days. Catapres being held if SBP< 120 and patient/fiancee advised to continue monitoring BP prior to taking catapres. He did develop right knee pain due to gout flare up and was treated with steroid dose pack with improvement in  symptoms. His diabetes has been monitored on ac/hs CBG checks and SSI was use prn for tighter BS control.  Levemir has been tirated up to 38 units bid and he is to resume home SSI regimen after discharge.   Anemia of chronic disease was supplemented with ESA. Leucocytosis due to steroids has resolved. Serial CBC showed that H/H is relatively stable. Phosphorus levels are managed with 2400 mg sevelamer tid ac. Weight at discharge is 131 kg. Patient has declined placement of permanent access and would like to  decrease frequency of HD as is praying for his renal function to recover. He has been set for TTS at [facility] in [place]. Patient and wife have been educated on CM/Renal diet as well as need for FR.   He has made gains during rehab stay and is currently at supervision to min assist level. He will continue to receive follow up HHPT,HHOT and HHRN by [facility]  after discharge   Rehab course: During patient's stay in rehab weekly team conferences were held to monitor patient's progress, set goals and discuss barriers to discharge. At admission, patient required mod assist with basic ADL tasks and with mobility.  He  has had improvement in activity tolerance, balance, postural control as well as ability to compensate for deficits. He is able to complete ADL tasks with supervision.  He requires supervision for transfers and to ambulate 100' with RW. Family education was completed with fiancee.     Disposition: Home  Diet: Carb Modified/Renal --1200 cc FR/day  Special Instructions: 1. Need to monitor weights daily.  2. Check BP tid and hold catapres if SBP < 120.  3. Monitor BS ac/hs and use SSI per home protocol.   Allergies as of 08/03/2019      Reactions   Hydralazine    Lisinopril Swelling      Medication List    STOP taking these medications   ergocalciferol 1.25 MG (50000 UT) capsule Commonly known as: VITAMIN D2   torsemide 20 MG tablet Commonly known as: DEMADEX     TAKE these medications   acetaminophen 325 MG tablet Commonly known as: TYLENOL Take 1-2 tablets (325-650 mg total) by mouth every 4 (four) hours as needed for mild pain.   albuterol 108 (90 Base) MCG/ACT inhaler Commonly known as: VENTOLIN HFA Inhale 2 puffs into the lungs every 4 (four) hours as needed.   allopurinol 100 MG tablet Commonly known as: ZYLOPRIM Take 2 tablets (200 mg total) by mouth daily.   amLODipine 5 MG tablet Commonly known as: NORVASC Take 1 tablet (5 mg total) by mouth daily. What  changed:   medication strength  how much to take   aspirin 81 MG EC tablet Take 1 tablet (81 mg total) by mouth daily.   atorvastatin 80 MG tablet Commonly known as: LIPITOR Take 1 tablet (80 mg total) by mouth daily.   calcium carbonate (dosed in mg elemental calcium) 1250 MG/5ML Susp Take 5 mLs (500 mg of elemental calcium total) by mouth every 6 (six) hours as needed for indigestion.   carvedilol 25 MG tablet Commonly known as: COREG Take 1 tablet (25 mg total) by mouth 2 (two) times daily with a meal.   cloNIDine 0.3 MG tablet Commonly known as: CATAPRES Take 1 tablet (0.3 mg total) by mouth 3 (three) times daily. Notes to patient: Hold if SBP (top number) is less than 120   diclofenac sodium 1 % Gel Commonly known as: VOLTAREN Apply 2 g topically 4 (  four) times daily. Notes to patient: Available over the counter--apply to knee and ankle.   ezetimibe 10 MG tablet Commonly known as: ZETIA Take 10 mg by mouth every evening.   fluticasone 50 MCG/ACT nasal spray Commonly known as: FLONASE Place 1 spray into both nostrils daily.   gabapentin 100 MG capsule Commonly known as: NEURONTIN Take 1 capsule (100 mg total) by mouth 3 (three) times daily.   HYDROcodone-acetaminophen 5-325 MG tablet Commonly known as: NORCO/VICODIN Take 1 tablet by mouth every 12 (twelve) hours as needed for severe pain.   insulin aspart 100 UNIT/ML injection Commonly known as: novoLOG Inject 5-15 Units into the skin 3 (three) times daily before meals.   insulin detemir 100 UNIT/ML injection Commonly known as: LEVEMIR Inject 0.38 mLs (38 Units total) into the skin 2 (two) times daily. What changed: how much to take   Insulin Pen Needle 31G X 5 MM Misc Use as directed.   linaclotide 145 MCG Caps capsule Commonly known as: LINZESS Take 1 capsule (145 mcg total) by mouth daily before breakfast.   multivitamin Tabs tablet Take 1 tablet by mouth at bedtime.   sevelamer carbonate 800 MG  tablet Commonly known as: RENVELA Take 3 tablets (2,400 mg total) by mouth 3 (three) times daily with meals. What changed: how much to take      Follow-up Information    Jamse Arn, MD Follow up.   Specialty: Physical Medicine and Rehabilitation Why: Office will call you with follow up appointment Contact information: [address] [place] [address] [phone]        [name], MD. Call.   Specialty: Internal Medicine Why: for post hospital follow up Contact information: [place] [address] [phone]        Arnoldo Lenis, MD. Call.   Specialty: Cardiology Why: for follow up appointment Contact information: [place] [address] [place], [place] Follow up on 08/04/2019.   Why: Appointment @ 10:30 Am Contact information: [address] [place] [address] [phone]           Signed: Bary Leriche 08/04/2019, 11:50 PM

## 2019-08-04 DIAGNOSIS — D631 Anemia in chronic kidney disease: Secondary | ICD-10-CM | POA: Diagnosis not present

## 2019-08-04 DIAGNOSIS — E559 Vitamin D deficiency, unspecified: Secondary | ICD-10-CM | POA: Diagnosis not present

## 2019-08-04 DIAGNOSIS — Z23 Encounter for immunization: Secondary | ICD-10-CM | POA: Diagnosis not present

## 2019-08-04 DIAGNOSIS — Z992 Dependence on renal dialysis: Secondary | ICD-10-CM | POA: Diagnosis not present

## 2019-08-04 DIAGNOSIS — N186 End stage renal disease: Secondary | ICD-10-CM | POA: Diagnosis not present

## 2019-08-04 DIAGNOSIS — Z794 Long term (current) use of insulin: Secondary | ICD-10-CM | POA: Diagnosis not present

## 2019-08-04 DIAGNOSIS — D509 Iron deficiency anemia, unspecified: Secondary | ICD-10-CM | POA: Diagnosis not present

## 2019-08-04 DIAGNOSIS — E7800 Pure hypercholesterolemia, unspecified: Secondary | ICD-10-CM | POA: Diagnosis not present

## 2019-08-04 DIAGNOSIS — E119 Type 2 diabetes mellitus without complications: Secondary | ICD-10-CM | POA: Diagnosis not present

## 2019-08-06 DIAGNOSIS — Z23 Encounter for immunization: Secondary | ICD-10-CM | POA: Diagnosis not present

## 2019-08-06 DIAGNOSIS — D509 Iron deficiency anemia, unspecified: Secondary | ICD-10-CM | POA: Diagnosis not present

## 2019-08-06 DIAGNOSIS — E559 Vitamin D deficiency, unspecified: Secondary | ICD-10-CM | POA: Diagnosis not present

## 2019-08-06 DIAGNOSIS — Z992 Dependence on renal dialysis: Secondary | ICD-10-CM | POA: Diagnosis not present

## 2019-08-06 DIAGNOSIS — D631 Anemia in chronic kidney disease: Secondary | ICD-10-CM | POA: Diagnosis not present

## 2019-08-06 DIAGNOSIS — N186 End stage renal disease: Secondary | ICD-10-CM | POA: Diagnosis not present

## 2019-08-09 DIAGNOSIS — N186 End stage renal disease: Secondary | ICD-10-CM | POA: Diagnosis not present

## 2019-08-09 DIAGNOSIS — Z992 Dependence on renal dialysis: Secondary | ICD-10-CM | POA: Diagnosis not present

## 2019-08-09 DIAGNOSIS — Z23 Encounter for immunization: Secondary | ICD-10-CM | POA: Diagnosis not present

## 2019-08-09 DIAGNOSIS — E559 Vitamin D deficiency, unspecified: Secondary | ICD-10-CM | POA: Diagnosis not present

## 2019-08-09 DIAGNOSIS — D631 Anemia in chronic kidney disease: Secondary | ICD-10-CM | POA: Diagnosis not present

## 2019-08-09 DIAGNOSIS — D509 Iron deficiency anemia, unspecified: Secondary | ICD-10-CM | POA: Diagnosis not present

## 2019-08-10 DIAGNOSIS — N179 Acute kidney failure, unspecified: Secondary | ICD-10-CM | POA: Diagnosis not present

## 2019-08-10 DIAGNOSIS — E785 Hyperlipidemia, unspecified: Secondary | ICD-10-CM | POA: Diagnosis not present

## 2019-08-10 DIAGNOSIS — Z6841 Body Mass Index (BMI) 40.0 and over, adult: Secondary | ICD-10-CM | POA: Diagnosis not present

## 2019-08-10 DIAGNOSIS — E1140 Type 2 diabetes mellitus with diabetic neuropathy, unspecified: Secondary | ICD-10-CM | POA: Diagnosis not present

## 2019-08-10 DIAGNOSIS — E1122 Type 2 diabetes mellitus with diabetic chronic kidney disease: Secondary | ICD-10-CM | POA: Diagnosis not present

## 2019-08-10 DIAGNOSIS — Z794 Long term (current) use of insulin: Secondary | ICD-10-CM | POA: Diagnosis not present

## 2019-08-10 DIAGNOSIS — Z7982 Long term (current) use of aspirin: Secondary | ICD-10-CM | POA: Diagnosis not present

## 2019-08-10 DIAGNOSIS — K760 Fatty (change of) liver, not elsewhere classified: Secondary | ICD-10-CM | POA: Diagnosis not present

## 2019-08-10 DIAGNOSIS — Z992 Dependence on renal dialysis: Secondary | ICD-10-CM | POA: Diagnosis not present

## 2019-08-10 DIAGNOSIS — M1990 Unspecified osteoarthritis, unspecified site: Secondary | ICD-10-CM | POA: Diagnosis not present

## 2019-08-10 DIAGNOSIS — N186 End stage renal disease: Secondary | ICD-10-CM | POA: Diagnosis not present

## 2019-08-10 DIAGNOSIS — Z5181 Encounter for therapeutic drug level monitoring: Secondary | ICD-10-CM | POA: Diagnosis not present

## 2019-08-10 DIAGNOSIS — E1165 Type 2 diabetes mellitus with hyperglycemia: Secondary | ICD-10-CM | POA: Diagnosis not present

## 2019-08-10 DIAGNOSIS — D631 Anemia in chronic kidney disease: Secondary | ICD-10-CM | POA: Diagnosis not present

## 2019-08-10 DIAGNOSIS — I5031 Acute diastolic (congestive) heart failure: Secondary | ICD-10-CM | POA: Diagnosis not present

## 2019-08-10 DIAGNOSIS — I130 Hypertensive heart and chronic kidney disease with heart failure and stage 1 through stage 4 chronic kidney disease, or unspecified chronic kidney disease: Secondary | ICD-10-CM | POA: Diagnosis not present

## 2019-08-10 DIAGNOSIS — Z9181 History of falling: Secondary | ICD-10-CM | POA: Diagnosis not present

## 2019-08-10 DIAGNOSIS — I5042 Chronic combined systolic (congestive) and diastolic (congestive) heart failure: Secondary | ICD-10-CM | POA: Diagnosis not present

## 2019-08-10 DIAGNOSIS — M1030 Gout due to renal impairment, unspecified site: Secondary | ICD-10-CM | POA: Diagnosis not present

## 2019-08-11 ENCOUNTER — Other Ambulatory Visit: Payer: Self-pay | Admitting: Vascular Surgery

## 2019-08-11 DIAGNOSIS — Z992 Dependence on renal dialysis: Secondary | ICD-10-CM | POA: Diagnosis not present

## 2019-08-11 DIAGNOSIS — Z23 Encounter for immunization: Secondary | ICD-10-CM | POA: Diagnosis not present

## 2019-08-11 DIAGNOSIS — E559 Vitamin D deficiency, unspecified: Secondary | ICD-10-CM | POA: Diagnosis not present

## 2019-08-11 DIAGNOSIS — D509 Iron deficiency anemia, unspecified: Secondary | ICD-10-CM | POA: Diagnosis not present

## 2019-08-11 DIAGNOSIS — N186 End stage renal disease: Secondary | ICD-10-CM | POA: Diagnosis not present

## 2019-08-11 DIAGNOSIS — I872 Venous insufficiency (chronic) (peripheral): Secondary | ICD-10-CM

## 2019-08-11 DIAGNOSIS — D631 Anemia in chronic kidney disease: Secondary | ICD-10-CM | POA: Diagnosis not present

## 2019-08-12 DIAGNOSIS — I5031 Acute diastolic (congestive) heart failure: Secondary | ICD-10-CM | POA: Diagnosis not present

## 2019-08-12 DIAGNOSIS — I130 Hypertensive heart and chronic kidney disease with heart failure and stage 1 through stage 4 chronic kidney disease, or unspecified chronic kidney disease: Secondary | ICD-10-CM | POA: Diagnosis not present

## 2019-08-12 DIAGNOSIS — E1122 Type 2 diabetes mellitus with diabetic chronic kidney disease: Secondary | ICD-10-CM | POA: Diagnosis not present

## 2019-08-12 DIAGNOSIS — N179 Acute kidney failure, unspecified: Secondary | ICD-10-CM | POA: Diagnosis not present

## 2019-08-12 DIAGNOSIS — E1165 Type 2 diabetes mellitus with hyperglycemia: Secondary | ICD-10-CM | POA: Diagnosis not present

## 2019-08-12 DIAGNOSIS — I5042 Chronic combined systolic (congestive) and diastolic (congestive) heart failure: Secondary | ICD-10-CM | POA: Diagnosis not present

## 2019-08-13 DIAGNOSIS — E559 Vitamin D deficiency, unspecified: Secondary | ICD-10-CM | POA: Diagnosis not present

## 2019-08-13 DIAGNOSIS — D631 Anemia in chronic kidney disease: Secondary | ICD-10-CM | POA: Diagnosis not present

## 2019-08-13 DIAGNOSIS — D509 Iron deficiency anemia, unspecified: Secondary | ICD-10-CM | POA: Diagnosis not present

## 2019-08-13 DIAGNOSIS — Z992 Dependence on renal dialysis: Secondary | ICD-10-CM | POA: Diagnosis not present

## 2019-08-13 DIAGNOSIS — N186 End stage renal disease: Secondary | ICD-10-CM | POA: Diagnosis not present

## 2019-08-13 DIAGNOSIS — Z23 Encounter for immunization: Secondary | ICD-10-CM | POA: Diagnosis not present

## 2019-08-15 ENCOUNTER — Telehealth: Payer: Self-pay

## 2019-08-15 NOTE — Telephone Encounter (Signed)
Timmothy Sours, OT from [facility] called requesting orders for [facility] 1wk3. Orders approved and given.

## 2019-08-16 DIAGNOSIS — N186 End stage renal disease: Secondary | ICD-10-CM | POA: Diagnosis not present

## 2019-08-16 DIAGNOSIS — D631 Anemia in chronic kidney disease: Secondary | ICD-10-CM | POA: Diagnosis not present

## 2019-08-16 DIAGNOSIS — E559 Vitamin D deficiency, unspecified: Secondary | ICD-10-CM | POA: Diagnosis not present

## 2019-08-16 DIAGNOSIS — Z23 Encounter for immunization: Secondary | ICD-10-CM | POA: Diagnosis not present

## 2019-08-16 DIAGNOSIS — D509 Iron deficiency anemia, unspecified: Secondary | ICD-10-CM | POA: Diagnosis not present

## 2019-08-16 DIAGNOSIS — Z992 Dependence on renal dialysis: Secondary | ICD-10-CM | POA: Diagnosis not present

## 2019-08-17 DIAGNOSIS — E1165 Type 2 diabetes mellitus with hyperglycemia: Secondary | ICD-10-CM | POA: Diagnosis not present

## 2019-08-17 DIAGNOSIS — E1122 Type 2 diabetes mellitus with diabetic chronic kidney disease: Secondary | ICD-10-CM | POA: Diagnosis not present

## 2019-08-17 DIAGNOSIS — I5042 Chronic combined systolic (congestive) and diastolic (congestive) heart failure: Secondary | ICD-10-CM | POA: Diagnosis not present

## 2019-08-17 DIAGNOSIS — N179 Acute kidney failure, unspecified: Secondary | ICD-10-CM | POA: Diagnosis not present

## 2019-08-17 DIAGNOSIS — I130 Hypertensive heart and chronic kidney disease with heart failure and stage 1 through stage 4 chronic kidney disease, or unspecified chronic kidney disease: Secondary | ICD-10-CM | POA: Diagnosis not present

## 2019-08-17 DIAGNOSIS — I5031 Acute diastolic (congestive) heart failure: Secondary | ICD-10-CM | POA: Diagnosis not present

## 2019-08-18 DIAGNOSIS — N186 End stage renal disease: Secondary | ICD-10-CM | POA: Diagnosis not present

## 2019-08-18 DIAGNOSIS — Z992 Dependence on renal dialysis: Secondary | ICD-10-CM | POA: Diagnosis not present

## 2019-08-18 DIAGNOSIS — E559 Vitamin D deficiency, unspecified: Secondary | ICD-10-CM | POA: Diagnosis not present

## 2019-08-18 DIAGNOSIS — Z23 Encounter for immunization: Secondary | ICD-10-CM | POA: Diagnosis not present

## 2019-08-18 DIAGNOSIS — D631 Anemia in chronic kidney disease: Secondary | ICD-10-CM | POA: Diagnosis not present

## 2019-08-18 DIAGNOSIS — D509 Iron deficiency anemia, unspecified: Secondary | ICD-10-CM | POA: Diagnosis not present

## 2019-08-20 DIAGNOSIS — D509 Iron deficiency anemia, unspecified: Secondary | ICD-10-CM | POA: Diagnosis not present

## 2019-08-20 DIAGNOSIS — D631 Anemia in chronic kidney disease: Secondary | ICD-10-CM | POA: Diagnosis not present

## 2019-08-20 DIAGNOSIS — E559 Vitamin D deficiency, unspecified: Secondary | ICD-10-CM | POA: Diagnosis not present

## 2019-08-20 DIAGNOSIS — N186 End stage renal disease: Secondary | ICD-10-CM | POA: Diagnosis not present

## 2019-08-20 DIAGNOSIS — Z992 Dependence on renal dialysis: Secondary | ICD-10-CM | POA: Diagnosis not present

## 2019-08-20 DIAGNOSIS — Z23 Encounter for immunization: Secondary | ICD-10-CM | POA: Diagnosis not present

## 2019-08-23 DIAGNOSIS — Z992 Dependence on renal dialysis: Secondary | ICD-10-CM | POA: Diagnosis not present

## 2019-08-23 DIAGNOSIS — D509 Iron deficiency anemia, unspecified: Secondary | ICD-10-CM | POA: Diagnosis not present

## 2019-08-23 DIAGNOSIS — N186 End stage renal disease: Secondary | ICD-10-CM | POA: Diagnosis not present

## 2019-08-23 DIAGNOSIS — Z23 Encounter for immunization: Secondary | ICD-10-CM | POA: Diagnosis not present

## 2019-08-23 DIAGNOSIS — E559 Vitamin D deficiency, unspecified: Secondary | ICD-10-CM | POA: Diagnosis not present

## 2019-08-23 DIAGNOSIS — D631 Anemia in chronic kidney disease: Secondary | ICD-10-CM | POA: Diagnosis not present

## 2019-08-24 DIAGNOSIS — E1165 Type 2 diabetes mellitus with hyperglycemia: Secondary | ICD-10-CM | POA: Diagnosis not present

## 2019-08-24 DIAGNOSIS — I130 Hypertensive heart and chronic kidney disease with heart failure and stage 1 through stage 4 chronic kidney disease, or unspecified chronic kidney disease: Secondary | ICD-10-CM | POA: Diagnosis not present

## 2019-08-24 DIAGNOSIS — I5042 Chronic combined systolic (congestive) and diastolic (congestive) heart failure: Secondary | ICD-10-CM | POA: Diagnosis not present

## 2019-08-24 DIAGNOSIS — N179 Acute kidney failure, unspecified: Secondary | ICD-10-CM | POA: Diagnosis not present

## 2019-08-24 DIAGNOSIS — E1122 Type 2 diabetes mellitus with diabetic chronic kidney disease: Secondary | ICD-10-CM | POA: Diagnosis not present

## 2019-08-24 DIAGNOSIS — I5031 Acute diastolic (congestive) heart failure: Secondary | ICD-10-CM | POA: Diagnosis not present

## 2019-08-25 DIAGNOSIS — Z23 Encounter for immunization: Secondary | ICD-10-CM | POA: Diagnosis not present

## 2019-08-25 DIAGNOSIS — E559 Vitamin D deficiency, unspecified: Secondary | ICD-10-CM | POA: Diagnosis not present

## 2019-08-25 DIAGNOSIS — N186 End stage renal disease: Secondary | ICD-10-CM | POA: Diagnosis not present

## 2019-08-25 DIAGNOSIS — D509 Iron deficiency anemia, unspecified: Secondary | ICD-10-CM | POA: Diagnosis not present

## 2019-08-25 DIAGNOSIS — D631 Anemia in chronic kidney disease: Secondary | ICD-10-CM | POA: Diagnosis not present

## 2019-08-25 DIAGNOSIS — Z992 Dependence on renal dialysis: Secondary | ICD-10-CM | POA: Diagnosis not present

## 2019-08-26 DIAGNOSIS — I5031 Acute diastolic (congestive) heart failure: Secondary | ICD-10-CM | POA: Diagnosis not present

## 2019-08-26 DIAGNOSIS — E1122 Type 2 diabetes mellitus with diabetic chronic kidney disease: Secondary | ICD-10-CM | POA: Diagnosis not present

## 2019-08-26 DIAGNOSIS — I5042 Chronic combined systolic (congestive) and diastolic (congestive) heart failure: Secondary | ICD-10-CM | POA: Diagnosis not present

## 2019-08-26 DIAGNOSIS — E1165 Type 2 diabetes mellitus with hyperglycemia: Secondary | ICD-10-CM | POA: Diagnosis not present

## 2019-08-26 DIAGNOSIS — N179 Acute kidney failure, unspecified: Secondary | ICD-10-CM | POA: Diagnosis not present

## 2019-08-26 DIAGNOSIS — I130 Hypertensive heart and chronic kidney disease with heart failure and stage 1 through stage 4 chronic kidney disease, or unspecified chronic kidney disease: Secondary | ICD-10-CM | POA: Diagnosis not present

## 2019-08-27 DIAGNOSIS — D509 Iron deficiency anemia, unspecified: Secondary | ICD-10-CM | POA: Diagnosis not present

## 2019-08-27 DIAGNOSIS — N186 End stage renal disease: Secondary | ICD-10-CM | POA: Diagnosis not present

## 2019-08-27 DIAGNOSIS — Z23 Encounter for immunization: Secondary | ICD-10-CM | POA: Diagnosis not present

## 2019-08-27 DIAGNOSIS — D631 Anemia in chronic kidney disease: Secondary | ICD-10-CM | POA: Diagnosis not present

## 2019-08-27 DIAGNOSIS — Z992 Dependence on renal dialysis: Secondary | ICD-10-CM | POA: Diagnosis not present

## 2019-08-27 DIAGNOSIS — E559 Vitamin D deficiency, unspecified: Secondary | ICD-10-CM | POA: Diagnosis not present

## 2019-08-29 ENCOUNTER — Telehealth (INDEPENDENT_AMBULATORY_CARE_PROVIDER_SITE_OTHER): Payer: Medicare Other | Admitting: Cardiology

## 2019-08-29 ENCOUNTER — Encounter: Payer: Self-pay | Admitting: Cardiology

## 2019-08-29 ENCOUNTER — Telehealth: Payer: Self-pay | Admitting: Cardiology

## 2019-08-29 VITALS — BP 137/91 | HR 89 | Ht 71.0 in | Wt 294.0 lb

## 2019-08-29 DIAGNOSIS — Z87891 Personal history of nicotine dependence: Secondary | ICD-10-CM | POA: Diagnosis not present

## 2019-08-29 DIAGNOSIS — Z79899 Other long term (current) drug therapy: Secondary | ICD-10-CM | POA: Diagnosis not present

## 2019-08-29 DIAGNOSIS — M1712 Unilateral primary osteoarthritis, left knee: Secondary | ICD-10-CM | POA: Diagnosis not present

## 2019-08-29 DIAGNOSIS — M25462 Effusion, left knee: Secondary | ICD-10-CM | POA: Diagnosis not present

## 2019-08-29 DIAGNOSIS — E1122 Type 2 diabetes mellitus with diabetic chronic kidney disease: Secondary | ICD-10-CM | POA: Diagnosis not present

## 2019-08-29 DIAGNOSIS — E785 Hyperlipidemia, unspecified: Secondary | ICD-10-CM | POA: Diagnosis not present

## 2019-08-29 DIAGNOSIS — I5032 Chronic diastolic (congestive) heart failure: Secondary | ICD-10-CM

## 2019-08-29 DIAGNOSIS — I10 Essential (primary) hypertension: Secondary | ICD-10-CM

## 2019-08-29 DIAGNOSIS — Z992 Dependence on renal dialysis: Secondary | ICD-10-CM | POA: Diagnosis not present

## 2019-08-29 DIAGNOSIS — K644 Residual hemorrhoidal skin tags: Secondary | ICD-10-CM | POA: Diagnosis not present

## 2019-08-29 DIAGNOSIS — N189 Chronic kidney disease, unspecified: Secondary | ICD-10-CM | POA: Diagnosis not present

## 2019-08-29 DIAGNOSIS — I129 Hypertensive chronic kidney disease with stage 1 through stage 4 chronic kidney disease, or unspecified chronic kidney disease: Secondary | ICD-10-CM | POA: Diagnosis not present

## 2019-08-29 NOTE — Telephone Encounter (Signed)

## 2019-08-29 NOTE — Patient Instructions (Signed)
Medication Instructions:  Your physician recommends that you continue on your current medications as directed. Please refer to the Current Medication list given to you today.  *If you need a refill on your cardiac medications before your next appointment, please call your pharmacy*  Lab Work: NONE   If you have labs (blood work) drawn today and your tests are completely normal, you will receive your results only by: . MyChart Message (if you have MyChart) OR . A paper copy in the mail If you have any lab test that is abnormal or we need to change your treatment, we will call you to review the results.  Testing/Procedures: NONE   Follow-Up: At [facility], you and your health needs are our priority.  As part of our continuing mission to provide you with exceptional heart care, we have created designated Provider Care Teams.  These Care Teams include your primary Cardiologist (physician) and Advanced Practice Providers (APPs -  Physician Assistants and Nurse Practitioners) who all work together to provide you with the care you need, when you need it.  Your next appointment:   6 month(s)  The format for your next appointment:   In Person  Provider:   Jonathan Branch, MD  Other Instructions Thank you for choosing [facility]!    

## 2019-08-29 NOTE — Progress Notes (Signed)
Virtual Visit via Telephone Note   This visit type was conducted due to national recommendations for restrictions regarding the COVID-19 Pandemic (e.g. social distancing) in an effort to limit this patient's exposure and mitigate transmission in our community.  Due to his co-morbid illnesses, this patient is at least at moderate risk for complications without adequate follow up.  This format is felt to be most appropriate for this patient at this time.  The patient did not have access to video technology/had technical difficulties with video requiring transitioning to audio format only (telephone).  All issues noted in this document were discussed and addressed.  No physical exam could be performed with this format.  Please refer to the patient's chart for his  consent to telehealth for Kelly Key.   Date:  08/29/2019   ID:  Kelly Key, DOB [birth year], MRN [id number]  Patient Location: Home Provider Location: Home  PCP:  Kelly Gravel, MD  Cardiologist:  Carlyle Dolly, MD  Electrophysiologist:  None   Evaluation Performed:  Follow-Up Visit  Chief Complaint:  Follow up visit  History of Present Illness:    Kelly Key is a 48 y.o. male seen today for follow up of the following medical problem.s   1. Chronic diastolic heart failure - [id number] echo: LVEF 60-65%, no WMAs, indeterminate diastolic function - [id number] echo LVEF [id number], indet diastolic function   - no recent SOB or DOE. Fluid management is per HD, though still makes some urine -  Dry weight 286. He reports has had some issues with HD machine clogging, has built some extra fluid up and weights have trended up but no significant swelling or SOB  2. ESRD - admission 06/2019, long rehab stay after discharge. - admitted with anasarca, started on dialysis   - goes to HD Tues, Thurs, Sat - no recent edema in legs    3. HTN - does not take meds prior to HD. Had some prior issues with low bp's on HD days that have  resolved  - on no HD days 130s/90s     The patient does not have symptoms concerning for COVID-19 infection (fever, chills, cough, or new shortness of breath).    Past Medical History:  Diagnosis Date  . Acute diastolic CHF (congestive heart failure) ([place]) 06/11/2012   EF 50-55% [facility])  . Diabetes mellitus    A1c 11.5 (06/11/2012).  . Gout   . Hepatic steatosis 06/11/2012   Elevated LFTs  . Hyperlipemia   . Malignant hypertension   . Microcytic anemia 06/12/2012  . Obesity    Past Surgical History:  Procedure Laterality Date  . IR FLUORO GUIDE CV LINE RIGHT  07/08/2019  . IR US GUIDE VASC ACCESS RIGHT  07/08/2019  . NO PAST SURGERIES       Current Meds  Medication Sig  . acetaminophen (TYLENOL) 325 MG tablet Take 1-2 tablets (325-650 mg total) by mouth every 4 (four) hours as needed for mild pain.  [name] albuterol (VENTOLIN HFA) 108 (90 Base) MCG/ACT inhaler Inhale 2 puffs into the lungs every 4 (four) hours as needed.  [name] allopurinol (ZYLOPRIM) 100 MG tablet Take 2 tablets (200 mg total) by mouth daily.  [name] amLODipine (NORVASC) 5 MG tablet Take 1 tablet (5 mg total) by mouth daily.  [name] aspirin EC 81 MG EC tablet Take 1 tablet (81 mg total) by mouth daily.  [name] atorvastatin (LIPITOR) 80 MG tablet Take 1 tablet (80 mg total) by mouth daily.  [name]  Calcium Carbonate Antacid (CALCIUM CARBONATE, DOSED IN MG ELEMENTAL CALCIUM,) 1250 MG/5ML SUSP Take 5 mLs (500 mg of elemental calcium total) by mouth every 6 (six) hours as needed for indigestion.  . carvedilol (COREG) 25 MG tablet Take 1 tablet (25 mg total) by mouth 2 (two) times daily with a meal.  . cloNIDine (CATAPRES) 0.3 MG tablet Take 1 tablet (0.3 mg total) by mouth 3 (three) times daily.  . diclofenac sodium (VOLTAREN) 1 % GEL Apply 2 g topically 4 (four) times daily.  [name] ezetimibe (ZETIA) 10 MG tablet Take 10 mg by mouth every evening.  . fluticasone (FLONASE) 50 MCG/ACT nasal spray Place 1 spray into both nostrils daily.  [name]  gabapentin (NEURONTIN) 100 MG capsule Take 1 capsule (100 mg total) by mouth 3 (three) times daily.  [name] HYDROcodone-acetaminophen (NORCO/VICODIN) 5-325 MG tablet Take 1 tablet by mouth every 12 (twelve) hours as needed for severe pain.  [name] insulin aspart (NOVOLOG) 100 UNIT/ML injection Inject 5-15 Units into the skin 3 (three) times daily before meals.   . insulin detemir (LEVEMIR) 100 UNIT/ML injection Inject 0.38 mLs (38 Units total) into the skin 2 (two) times daily.  . Insulin Pen Needle 31G X 5 MM MISC Use as directed.  . linaclotide (LINZESS) 145 MCG CAPS capsule Take 1 capsule (145 mcg total) by mouth daily before breakfast.  . multivitamin (RENA-VIT) TABS tablet Take 1 tablet by mouth at bedtime.  . sevelamer carbonate (RENVELA) 800 MG tablet Take 3 tablets (2,400 mg total) by mouth 3 (three) times daily with meals.     Allergies:   Hydralazine and Lisinopril   Social History   Tobacco Use  . Smoking status: Never Smoker  . Smokeless tobacco: Never Used  Substance Use Topics  . Alcohol use: No    Comment: haven't drank in over 5 years   . Drug use: No     Family Hx: The patient's family history includes Asthma in his mother; Diabetes in his father, sister, and sister; Gout in his mother; Heart failure in his father; Hypertension in his brother; Pancreatic cancer in his brother.  ROS:   Please see the history of present illness.     All other systems reviewed and are negative.   Prior CV studies:   The following studies were reviewed today:   Labs/Other Tests and Data Reviewed:    EKG:  No ECG reviewed.  Recent Labs: 06/22/2019: ALT 21; B Natriuretic Peptide 119.0 07/05/2019: Magnesium 2.1 08/02/2019: BUN 68; Creatinine, Ser 5.82; Hemoglobin 9.1; Platelets 355; Potassium 4.2; Sodium 131   Recent Lipid Panel No results found for: CHOL, TRIG, HDL, CHOLHDL, LDLCALC, LDLDIRECT  Wt Readings from Last 3 Encounters:  08/29/19 294 lb (133.4 kg)  08/02/19 288 lb 12.8 oz (131  kg)  07/16/19 269 lb (122 kg)     Objective:    Vital Signs:  BP (!) 137/91   Pulse 89   Ht 5\' 11"  (1.803 m)   Wt 294 lb (133.4 kg)   BMI 41.00 kg/m    Normal affect. Normal speech pattern and tone. Comfortable, no apparent distress. No audible signs of SOB or wheezing  ASSESSMENT & PLAN:    1.Chronic diastolic HF - fluid management per HD and nephorlogy - no current symptoms, continue to monitor  2. ESRD - HD per nephrology   3. HTN - reasonable control given issues with labile bp's on HD days, non HD days are reasonable at this time. Continue current meds  F/u 6 months  COVID-19 Education: The signs and symptoms of COVID-19 were discussed with the patient and how to seek care for testing (follow up with PCP or arrange E-visit).  The importance of social distancing was discussed today.  Time:   Today, I have spent 14 minutes with the patient with telehealth technology discussing the above problems.     Medication Adjustments/Labs and Tests Ordered: Current medicines are reviewed at length with the patient today.  Concerns regarding medicines are outlined above.   Tests Ordered: No orders of the defined types were placed in this encounter.   Medication Changes: No orders of the defined types were placed in this encounter.   Follow Up:  In Person in 6 month(s)  Signed, Carlyle Dolly, MD  08/29/2019 10:37 AM    [place]

## 2019-08-30 DIAGNOSIS — Z992 Dependence on renal dialysis: Secondary | ICD-10-CM | POA: Diagnosis not present

## 2019-08-30 DIAGNOSIS — D509 Iron deficiency anemia, unspecified: Secondary | ICD-10-CM | POA: Diagnosis not present

## 2019-08-30 DIAGNOSIS — D631 Anemia in chronic kidney disease: Secondary | ICD-10-CM | POA: Diagnosis not present

## 2019-08-30 DIAGNOSIS — N186 End stage renal disease: Secondary | ICD-10-CM | POA: Diagnosis not present

## 2019-08-31 DIAGNOSIS — E1165 Type 2 diabetes mellitus with hyperglycemia: Secondary | ICD-10-CM | POA: Diagnosis not present

## 2019-08-31 DIAGNOSIS — N179 Acute kidney failure, unspecified: Secondary | ICD-10-CM | POA: Diagnosis not present

## 2019-08-31 DIAGNOSIS — E1122 Type 2 diabetes mellitus with diabetic chronic kidney disease: Secondary | ICD-10-CM | POA: Diagnosis not present

## 2019-08-31 DIAGNOSIS — I5042 Chronic combined systolic (congestive) and diastolic (congestive) heart failure: Secondary | ICD-10-CM | POA: Diagnosis not present

## 2019-08-31 DIAGNOSIS — I130 Hypertensive heart and chronic kidney disease with heart failure and stage 1 through stage 4 chronic kidney disease, or unspecified chronic kidney disease: Secondary | ICD-10-CM | POA: Diagnosis not present

## 2019-08-31 DIAGNOSIS — I5031 Acute diastolic (congestive) heart failure: Secondary | ICD-10-CM | POA: Diagnosis not present

## 2019-09-01 DIAGNOSIS — D631 Anemia in chronic kidney disease: Secondary | ICD-10-CM | POA: Diagnosis not present

## 2019-09-01 DIAGNOSIS — N186 End stage renal disease: Secondary | ICD-10-CM | POA: Diagnosis not present

## 2019-09-01 DIAGNOSIS — Z992 Dependence on renal dialysis: Secondary | ICD-10-CM | POA: Diagnosis not present

## 2019-09-01 DIAGNOSIS — D509 Iron deficiency anemia, unspecified: Secondary | ICD-10-CM | POA: Diagnosis not present

## 2019-09-02 DIAGNOSIS — I130 Hypertensive heart and chronic kidney disease with heart failure and stage 1 through stage 4 chronic kidney disease, or unspecified chronic kidney disease: Secondary | ICD-10-CM | POA: Diagnosis not present

## 2019-09-02 DIAGNOSIS — N179 Acute kidney failure, unspecified: Secondary | ICD-10-CM | POA: Diagnosis not present

## 2019-09-02 DIAGNOSIS — I5031 Acute diastolic (congestive) heart failure: Secondary | ICD-10-CM | POA: Diagnosis not present

## 2019-09-02 DIAGNOSIS — E1122 Type 2 diabetes mellitus with diabetic chronic kidney disease: Secondary | ICD-10-CM | POA: Diagnosis not present

## 2019-09-02 DIAGNOSIS — E1165 Type 2 diabetes mellitus with hyperglycemia: Secondary | ICD-10-CM | POA: Diagnosis not present

## 2019-09-02 DIAGNOSIS — I5042 Chronic combined systolic (congestive) and diastolic (congestive) heart failure: Secondary | ICD-10-CM | POA: Diagnosis not present

## 2019-09-03 DIAGNOSIS — Z992 Dependence on renal dialysis: Secondary | ICD-10-CM | POA: Diagnosis not present

## 2019-09-03 DIAGNOSIS — N186 End stage renal disease: Secondary | ICD-10-CM | POA: Diagnosis not present

## 2019-09-03 DIAGNOSIS — D631 Anemia in chronic kidney disease: Secondary | ICD-10-CM | POA: Diagnosis not present

## 2019-09-03 DIAGNOSIS — D509 Iron deficiency anemia, unspecified: Secondary | ICD-10-CM | POA: Diagnosis not present

## 2019-09-05 ENCOUNTER — Other Ambulatory Visit: Payer: Self-pay | Admitting: Physical Medicine and Rehabilitation

## 2019-09-05 NOTE — Telephone Encounter (Signed)
Not a patient of this clinic.  Med needs to be refilled and managed by patients primary care provider

## 2019-09-06 DIAGNOSIS — D631 Anemia in chronic kidney disease: Secondary | ICD-10-CM | POA: Diagnosis not present

## 2019-09-06 DIAGNOSIS — D509 Iron deficiency anemia, unspecified: Secondary | ICD-10-CM | POA: Diagnosis not present

## 2019-09-06 DIAGNOSIS — Z992 Dependence on renal dialysis: Secondary | ICD-10-CM | POA: Diagnosis not present

## 2019-09-06 DIAGNOSIS — N186 End stage renal disease: Secondary | ICD-10-CM | POA: Diagnosis not present

## 2019-09-08 DIAGNOSIS — D631 Anemia in chronic kidney disease: Secondary | ICD-10-CM | POA: Diagnosis not present

## 2019-09-08 DIAGNOSIS — N186 End stage renal disease: Secondary | ICD-10-CM | POA: Diagnosis not present

## 2019-09-08 DIAGNOSIS — Z992 Dependence on renal dialysis: Secondary | ICD-10-CM | POA: Diagnosis not present

## 2019-09-08 DIAGNOSIS — D509 Iron deficiency anemia, unspecified: Secondary | ICD-10-CM | POA: Diagnosis not present

## 2019-09-09 DIAGNOSIS — D631 Anemia in chronic kidney disease: Secondary | ICD-10-CM | POA: Diagnosis not present

## 2019-09-09 DIAGNOSIS — N179 Acute kidney failure, unspecified: Secondary | ICD-10-CM | POA: Diagnosis not present

## 2019-09-09 DIAGNOSIS — I5031 Acute diastolic (congestive) heart failure: Secondary | ICD-10-CM | POA: Diagnosis not present

## 2019-09-09 DIAGNOSIS — M1030 Gout due to renal impairment, unspecified site: Secondary | ICD-10-CM | POA: Diagnosis not present

## 2019-09-09 DIAGNOSIS — Z9181 History of falling: Secondary | ICD-10-CM | POA: Diagnosis not present

## 2019-09-09 DIAGNOSIS — M1990 Unspecified osteoarthritis, unspecified site: Secondary | ICD-10-CM | POA: Diagnosis not present

## 2019-09-09 DIAGNOSIS — I5042 Chronic combined systolic (congestive) and diastolic (congestive) heart failure: Secondary | ICD-10-CM | POA: Diagnosis not present

## 2019-09-09 DIAGNOSIS — Z7982 Long term (current) use of aspirin: Secondary | ICD-10-CM | POA: Diagnosis not present

## 2019-09-09 DIAGNOSIS — E1165 Type 2 diabetes mellitus with hyperglycemia: Secondary | ICD-10-CM | POA: Diagnosis not present

## 2019-09-09 DIAGNOSIS — K760 Fatty (change of) liver, not elsewhere classified: Secondary | ICD-10-CM | POA: Diagnosis not present

## 2019-09-09 DIAGNOSIS — E1122 Type 2 diabetes mellitus with diabetic chronic kidney disease: Secondary | ICD-10-CM | POA: Diagnosis not present

## 2019-09-09 DIAGNOSIS — Z794 Long term (current) use of insulin: Secondary | ICD-10-CM | POA: Diagnosis not present

## 2019-09-09 DIAGNOSIS — E785 Hyperlipidemia, unspecified: Secondary | ICD-10-CM | POA: Diagnosis not present

## 2019-09-09 DIAGNOSIS — Z992 Dependence on renal dialysis: Secondary | ICD-10-CM | POA: Diagnosis not present

## 2019-09-09 DIAGNOSIS — E1140 Type 2 diabetes mellitus with diabetic neuropathy, unspecified: Secondary | ICD-10-CM | POA: Diagnosis not present

## 2019-09-09 DIAGNOSIS — Z5181 Encounter for therapeutic drug level monitoring: Secondary | ICD-10-CM | POA: Diagnosis not present

## 2019-09-09 DIAGNOSIS — I130 Hypertensive heart and chronic kidney disease with heart failure and stage 1 through stage 4 chronic kidney disease, or unspecified chronic kidney disease: Secondary | ICD-10-CM | POA: Diagnosis not present

## 2019-09-09 DIAGNOSIS — Z6841 Body Mass Index (BMI) 40.0 and over, adult: Secondary | ICD-10-CM | POA: Diagnosis not present

## 2019-09-09 DIAGNOSIS — N186 End stage renal disease: Secondary | ICD-10-CM | POA: Diagnosis not present

## 2019-09-10 DIAGNOSIS — Z992 Dependence on renal dialysis: Secondary | ICD-10-CM | POA: Diagnosis not present

## 2019-09-10 DIAGNOSIS — N186 End stage renal disease: Secondary | ICD-10-CM | POA: Diagnosis not present

## 2019-09-10 DIAGNOSIS — D631 Anemia in chronic kidney disease: Secondary | ICD-10-CM | POA: Diagnosis not present

## 2019-09-10 DIAGNOSIS — D509 Iron deficiency anemia, unspecified: Secondary | ICD-10-CM | POA: Diagnosis not present

## 2019-09-13 DIAGNOSIS — Z992 Dependence on renal dialysis: Secondary | ICD-10-CM | POA: Diagnosis not present

## 2019-09-13 DIAGNOSIS — D631 Anemia in chronic kidney disease: Secondary | ICD-10-CM | POA: Diagnosis not present

## 2019-09-13 DIAGNOSIS — N186 End stage renal disease: Secondary | ICD-10-CM | POA: Diagnosis not present

## 2019-09-13 DIAGNOSIS — D509 Iron deficiency anemia, unspecified: Secondary | ICD-10-CM | POA: Diagnosis not present

## 2019-09-14 DIAGNOSIS — M79672 Pain in left foot: Secondary | ICD-10-CM | POA: Diagnosis not present

## 2019-09-14 DIAGNOSIS — E1140 Type 2 diabetes mellitus with diabetic neuropathy, unspecified: Secondary | ICD-10-CM | POA: Diagnosis not present

## 2019-09-14 DIAGNOSIS — M79671 Pain in right foot: Secondary | ICD-10-CM | POA: Diagnosis not present

## 2019-09-15 DIAGNOSIS — D509 Iron deficiency anemia, unspecified: Secondary | ICD-10-CM | POA: Diagnosis not present

## 2019-09-15 DIAGNOSIS — Z992 Dependence on renal dialysis: Secondary | ICD-10-CM | POA: Diagnosis not present

## 2019-09-15 DIAGNOSIS — D631 Anemia in chronic kidney disease: Secondary | ICD-10-CM | POA: Diagnosis not present

## 2019-09-15 DIAGNOSIS — N186 End stage renal disease: Secondary | ICD-10-CM | POA: Diagnosis not present

## 2019-09-17 DIAGNOSIS — D631 Anemia in chronic kidney disease: Secondary | ICD-10-CM | POA: Diagnosis not present

## 2019-09-17 DIAGNOSIS — N186 End stage renal disease: Secondary | ICD-10-CM | POA: Diagnosis not present

## 2019-09-17 DIAGNOSIS — D509 Iron deficiency anemia, unspecified: Secondary | ICD-10-CM | POA: Diagnosis not present

## 2019-09-17 DIAGNOSIS — Z992 Dependence on renal dialysis: Secondary | ICD-10-CM | POA: Diagnosis not present

## 2019-09-20 DIAGNOSIS — Z992 Dependence on renal dialysis: Secondary | ICD-10-CM | POA: Diagnosis not present

## 2019-09-20 DIAGNOSIS — N186 End stage renal disease: Secondary | ICD-10-CM | POA: Diagnosis not present

## 2019-09-20 DIAGNOSIS — D509 Iron deficiency anemia, unspecified: Secondary | ICD-10-CM | POA: Diagnosis not present

## 2019-09-20 DIAGNOSIS — D631 Anemia in chronic kidney disease: Secondary | ICD-10-CM | POA: Diagnosis not present

## 2019-09-21 DIAGNOSIS — I130 Hypertensive heart and chronic kidney disease with heart failure and stage 1 through stage 4 chronic kidney disease, or unspecified chronic kidney disease: Secondary | ICD-10-CM | POA: Diagnosis not present

## 2019-09-21 DIAGNOSIS — E1122 Type 2 diabetes mellitus with diabetic chronic kidney disease: Secondary | ICD-10-CM | POA: Diagnosis not present

## 2019-09-21 DIAGNOSIS — N179 Acute kidney failure, unspecified: Secondary | ICD-10-CM | POA: Diagnosis not present

## 2019-09-21 DIAGNOSIS — E1165 Type 2 diabetes mellitus with hyperglycemia: Secondary | ICD-10-CM | POA: Diagnosis not present

## 2019-09-21 DIAGNOSIS — I5042 Chronic combined systolic (congestive) and diastolic (congestive) heart failure: Secondary | ICD-10-CM | POA: Diagnosis not present

## 2019-09-21 DIAGNOSIS — I5031 Acute diastolic (congestive) heart failure: Secondary | ICD-10-CM | POA: Diagnosis not present

## 2019-09-22 DIAGNOSIS — N186 End stage renal disease: Secondary | ICD-10-CM | POA: Diagnosis not present

## 2019-09-22 DIAGNOSIS — D509 Iron deficiency anemia, unspecified: Secondary | ICD-10-CM | POA: Diagnosis not present

## 2019-09-22 DIAGNOSIS — Z992 Dependence on renal dialysis: Secondary | ICD-10-CM | POA: Diagnosis not present

## 2019-09-22 DIAGNOSIS — D631 Anemia in chronic kidney disease: Secondary | ICD-10-CM | POA: Diagnosis not present

## 2019-09-24 DIAGNOSIS — N186 End stage renal disease: Secondary | ICD-10-CM | POA: Diagnosis not present

## 2019-09-24 DIAGNOSIS — D631 Anemia in chronic kidney disease: Secondary | ICD-10-CM | POA: Diagnosis not present

## 2019-09-24 DIAGNOSIS — D509 Iron deficiency anemia, unspecified: Secondary | ICD-10-CM | POA: Diagnosis not present

## 2019-09-24 DIAGNOSIS — Z992 Dependence on renal dialysis: Secondary | ICD-10-CM | POA: Diagnosis not present

## 2019-09-26 DIAGNOSIS — N186 End stage renal disease: Secondary | ICD-10-CM | POA: Diagnosis not present

## 2019-09-26 DIAGNOSIS — Z992 Dependence on renal dialysis: Secondary | ICD-10-CM | POA: Diagnosis not present

## 2019-10-10 DIAGNOSIS — I130 Hypertensive heart and chronic kidney disease with heart failure and stage 1 through stage 4 chronic kidney disease, or unspecified chronic kidney disease: Secondary | ICD-10-CM | POA: Diagnosis not present

## 2019-10-10 DIAGNOSIS — D631 Anemia in chronic kidney disease: Secondary | ICD-10-CM | POA: Diagnosis not present

## 2019-10-10 DIAGNOSIS — Z6841 Body Mass Index (BMI) 40.0 and over, adult: Secondary | ICD-10-CM

## 2019-10-10 DIAGNOSIS — E785 Hyperlipidemia, unspecified: Secondary | ICD-10-CM | POA: Diagnosis not present

## 2019-10-10 DIAGNOSIS — Z5181 Encounter for therapeutic drug level monitoring: Secondary | ICD-10-CM

## 2019-10-10 DIAGNOSIS — I5042 Chronic combined systolic (congestive) and diastolic (congestive) heart failure: Secondary | ICD-10-CM | POA: Diagnosis not present

## 2019-10-10 DIAGNOSIS — N186 End stage renal disease: Secondary | ICD-10-CM | POA: Diagnosis not present

## 2019-10-10 DIAGNOSIS — Z7982 Long term (current) use of aspirin: Secondary | ICD-10-CM

## 2019-10-10 DIAGNOSIS — E1140 Type 2 diabetes mellitus with diabetic neuropathy, unspecified: Secondary | ICD-10-CM | POA: Diagnosis not present

## 2019-10-10 DIAGNOSIS — I5031 Acute diastolic (congestive) heart failure: Secondary | ICD-10-CM | POA: Diagnosis not present

## 2019-10-10 DIAGNOSIS — E1122 Type 2 diabetes mellitus with diabetic chronic kidney disease: Secondary | ICD-10-CM | POA: Diagnosis not present

## 2019-10-10 DIAGNOSIS — M1030 Gout due to renal impairment, unspecified site: Secondary | ICD-10-CM | POA: Diagnosis not present

## 2019-10-10 DIAGNOSIS — Z794 Long term (current) use of insulin: Secondary | ICD-10-CM

## 2019-10-10 DIAGNOSIS — M1990 Unspecified osteoarthritis, unspecified site: Secondary | ICD-10-CM

## 2019-10-10 DIAGNOSIS — Z992 Dependence on renal dialysis: Secondary | ICD-10-CM

## 2019-10-10 DIAGNOSIS — K760 Fatty (change of) liver, not elsewhere classified: Secondary | ICD-10-CM | POA: Diagnosis not present

## 2019-10-10 DIAGNOSIS — N179 Acute kidney failure, unspecified: Secondary | ICD-10-CM | POA: Diagnosis not present

## 2019-10-10 DIAGNOSIS — E1165 Type 2 diabetes mellitus with hyperglycemia: Secondary | ICD-10-CM | POA: Diagnosis not present

## 2019-10-10 DIAGNOSIS — Z9181 History of falling: Secondary | ICD-10-CM

## 2019-10-14 ENCOUNTER — Telehealth (HOSPITAL_COMMUNITY): Payer: Self-pay | Admitting: *Deleted

## 2019-10-14 NOTE — Telephone Encounter (Signed)
The above patient or their representative was contacted and gave the following answers to these questions:         Do you have any of the following symptoms?    NO  Fever                    Cough                   Shortness of breath  Do  you have any of the following other symptoms? n   muscle pain         vomiting,        diarrhea        rash         weakness        red eye        abdominal pain         bruising          bruising or bleeding              joint pain           severe headache    Have you been in contact with someone who was or has been sick in the past 2 weeks?  NO  Yes                 Unsure                         Unable to assess   Does the person that you were in contact with have any of the following symptoms? n Cough         shortness of breath           muscle pain         vomiting,            diarrhea            rash            weakness           fever            red eye           abdominal pain           bruising  or  bleeding                joint pain                severe headache                 COMMENTS OR ACTION PLAN FOR THIS PATIENT:         q

## 2019-10-16 ENCOUNTER — Other Ambulatory Visit: Payer: Self-pay

## 2019-10-16 DIAGNOSIS — N189 Chronic kidney disease, unspecified: Secondary | ICD-10-CM

## 2019-10-17 ENCOUNTER — Other Ambulatory Visit: Payer: Self-pay | Admitting: *Deleted

## 2019-10-17 ENCOUNTER — Encounter: Payer: Self-pay | Admitting: *Deleted

## 2019-10-17 ENCOUNTER — Ambulatory Visit (INDEPENDENT_AMBULATORY_CARE_PROVIDER_SITE_OTHER)
Admission: RE | Admit: 2019-10-17 | Discharge: 2019-10-17 | Disposition: A | Discharge status: Home / Self Care | Payer: Medicare Other | Source: Ambulatory Visit | Attending: Family | Admitting: Family

## 2019-10-17 ENCOUNTER — Other Ambulatory Visit: Payer: Self-pay

## 2019-10-17 ENCOUNTER — Ambulatory Visit (INDEPENDENT_AMBULATORY_CARE_PROVIDER_SITE_OTHER): Payer: Medicare Other | Admitting: Surgery

## 2019-10-17 ENCOUNTER — Ambulatory Visit (HOSPITAL_COMMUNITY)
Admission: RE | Admit: 2019-10-17 | Discharge: 2019-10-17 | Disposition: A | Discharge status: Home / Self Care | Payer: Medicare Other | Source: Ambulatory Visit | Attending: Family | Admitting: Family

## 2019-10-17 ENCOUNTER — Encounter: Payer: Self-pay | Admitting: Surgery

## 2019-10-17 VITALS — BP 172/102 | HR 90 | Temp 98.1°F | Resp 20 | Ht 71.0 in | Wt 310.0 lb

## 2019-10-17 DIAGNOSIS — Z992 Dependence on renal dialysis: Secondary | ICD-10-CM

## 2019-10-17 DIAGNOSIS — N189 Chronic kidney disease, unspecified: Secondary | ICD-10-CM | POA: Insufficient documentation

## 2019-10-17 DIAGNOSIS — N186 End stage renal disease: Secondary | ICD-10-CM | POA: Diagnosis not present

## 2019-10-17 NOTE — Progress Notes (Signed)
Reviewed pre-op instructions with patient's wife. (see Letter) Verbalized understanding and Letter faxed to [facility].

## 2019-10-17 NOTE — Progress Notes (Signed)
[facility]  Patient name: Kelly Key MRN: [id number] DOB: [birth year] Sex: male   REQUESTING PROVIDER:    Renal   REASON FOR CONSULT:    dialysis  HISTORY OF PRESENT ILLNESS:   Kelly Key is a 48 y.o. male, who is referred for evaluation of dialysis access.  Patient is renal failure secondary to hypertension and diabetes.  His diabetes is poorly controlled.  He is a non-smoker.  He takes a statin for hypercholesterolemia.  He is right-handed.  He currently is on dialysis via a right sided catheter on Tuesday Thursday Saturday.  PAST MEDICAL HISTORY    Past Medical History:  Diagnosis Date  . Acute diastolic CHF (congestive heart failure) ([place]) 06/11/2012   EF 50-55% [facility])  . Diabetes mellitus    A1c 11.5 (06/11/2012).  . Gout   . Hepatic steatosis 06/11/2012   Elevated LFTs  . Hyperlipemia   . Malignant hypertension   . Microcytic anemia 06/12/2012  . Obesity      FAMILY HISTORY   Family History  Problem Relation Age of Onset  . Gout Mother   . Asthma Mother   . Diabetes Father   . Heart failure Father   . Diabetes Sister   . Hypertension Brother   . Pancreatic cancer Brother   . Diabetes Sister     SOCIAL HISTORY:   Social History   Socioeconomic History  . Marital status: Single    Spouse name: Not on file  . Number of children: Not on file  . Years of education: Not on file  . Highest education level: Not on file  Occupational History  . Occupation: retired  Tobacco Use  . Smoking status: Never Smoker  . Smokeless tobacco: Never Used  Substance and Sexual Activity  . Alcohol use: No    Comment: haven't drank in over 5 years   . Drug use: No  . Sexual activity: Not Currently  Other Topics Concern  . Not on file  Social History Narrative  . Not on file   Social Determinants of Health   Financial Resource Strain: Low Risk   . Difficulty of Paying Living  Expenses: Not hard at all  Food Insecurity: Unknown  . Worried About Charity fundraiser in the Last Year: Patient refused  . Ran Out of Food in the Last Year: Patient refused  Transportation Needs: Unknown  . Lack of Transportation (Medical): Patient refused  . Lack of Transportation (Non-Medical): Patient refused  Physical Activity: Unknown  . Days of Exercise per Week: Patient refused  . Minutes of Exercise per Session: Patient refused  Stress: No Stress Concern Present  . Feeling of Stress : Not at all  Social Connections: Unknown  . Frequency of Communication with Friends and Family: Patient refused  . Frequency of Social Gatherings with Friends and Family: Patient refused  . Attends Religious Services: Patient refused  . Active Member of Clubs or Organizations: Patient refused  . Attends Archivist Meetings: Patient refused  . Marital Status: Patient refused  Intimate Partner Violence: Unknown  . Fear of Current or Ex-Partner: Patient refused  . Emotionally Abused: Patient refused  . Physically Abused: Patient refused  . Sexually Abused: Patient refused    ALLERGIES:    Allergies  Allergen Reactions  . Hydralazine   . Lisinopril Swelling    CURRENT MEDICATIONS:    Current Outpatient Medications  Medication Sig Dispense Refill  . acetaminophen (TYLENOL) 325  MG tablet Take 1-2 tablets (325-650 mg total) by mouth every 4 (four) hours as needed for mild pain.    [name] albuterol (VENTOLIN HFA) 108 (90 Base) MCG/ACT inhaler Inhale 2 puffs into the lungs every 4 (four) hours as needed.    [name] allopurinol (ZYLOPRIM) 100 MG tablet Take 2 tablets (200 mg total) by mouth daily. 30 tablet 0  . amLODipine (NORVASC) 5 MG tablet Take 1 tablet (5 mg total) by mouth daily. 30 tablet 0  . aspirin EC 81 MG EC tablet Take 1 tablet (81 mg total) by mouth daily. 30 tablet 0  . atorvastatin (LIPITOR) 80 MG tablet Take 1 tablet (80 mg total) by mouth daily. 30 tablet 0  . Calcium  Carbonate Antacid (CALCIUM CARBONATE, DOSED IN MG ELEMENTAL CALCIUM,) 1250 MG/5ML SUSP Take 5 mLs (500 mg of elemental calcium total) by mouth every 6 (six) hours as needed for indigestion. 450 mL   . carvedilol (COREG) 25 MG tablet Take 1 tablet (25 mg total) by mouth 2 (two) times daily with a meal. 60 tablet 0  . diclofenac sodium (VOLTAREN) 1 % GEL Apply 2 g topically 4 (four) times daily.    [name] ezetimibe (ZETIA) 10 MG tablet Take 10 mg by mouth every evening.    . furosemide (LASIX) 80 MG tablet Take 80 mg by mouth daily.    [name] gabapentin (NEURONTIN) 100 MG capsule Take 1 capsule (100 mg total) by mouth 3 (three) times daily. 90 capsule 0  . HYDROcodone-acetaminophen (NORCO/VICODIN) 5-325 MG tablet Take 1 tablet by mouth every 12 (twelve) hours as needed for severe pain. 10 tablet 0  . insulin aspart (NOVOLOG) 100 UNIT/ML injection Inject 5-15 Units into the skin 3 (three) times daily before meals.     . insulin detemir (LEVEMIR) 100 UNIT/ML injection Inject 0.38 mLs (38 Units total) into the skin 2 (two) times daily. 10 mL 1  . Insulin Pen Needle 31G X 5 MM MISC Use as directed. 100 each 0  . linaclotide (LINZESS) 145 MCG CAPS capsule Take 1 capsule (145 mcg total) by mouth daily before breakfast. 30 capsule 0  . multivitamin (RENA-VIT) TABS tablet Take 1 tablet by mouth at bedtime. 30 tablet 0  . sevelamer carbonate (RENVELA) 800 MG tablet Take 3 tablets (2,400 mg total) by mouth 3 (three) times daily with meals. 270 tablet 0  . cloNIDine (CATAPRES) 0.3 MG tablet Take 1 tablet (0.3 mg total) by mouth 3 (three) times daily. 90 tablet 0  . fluticasone (FLONASE) 50 MCG/ACT nasal spray Place 1 spray into both nostrils daily. 1 g 0   No current facility-administered medications for this visit.    REVIEW OF SYSTEMS:   [X]  denotes positive finding, [ ]  denotes negative finding Cardiac  Comments:  Chest pain or chest pressure:    Shortness of breath upon exertion:    Short of breath when lying  flat:    Irregular heart rhythm:        Vascular    Pain in calf, thigh, or hip brought on by ambulation:    Pain in feet at night that wakes you up from your sleep:     Blood clot in your veins:    Leg swelling:         Pulmonary    Oxygen at home:    Productive cough:     Wheezing:         Neurologic    Sudden weakness in arms or legs:  Sudden numbness in arms or legs:     Sudden onset of difficulty speaking or slurred speech:    Temporary loss of vision in one eye:     Problems with dizziness:         Gastrointestinal    Blood in stool:      Vomited blood:         Genitourinary    Burning when urinating:     Blood in urine:        Psychiatric    Major depression:         Hematologic    Bleeding problems:    Problems with blood clotting too easily:        Skin    Rashes or ulcers:        Constitutional    Fever or chills:     PHYSICAL EXAM:   Vitals:   10/17/19 1210  BP: (!) 172/102  Pulse: 90  Resp: 20  Temp: 98.1 F (36.7 C)  SpO2: 98%  Weight: (!) 310 lb (140.6 kg)  Height: 5\' 11"  (1.803 m)    GENERAL: The patient is a well-nourished male, in no acute distress. The vital signs are documented above. CARDIAC: There is a regular rate and rhythm.  VASCULAR: Palpable radial pulses. PULMONARY: Nonlabored respirations MUSCULOSKELETAL: There are no major deformities or cyanosis. NEUROLOGIC: No focal weakness or paresthesias are detected. SKIN: There are no ulcers or rashes noted. PSYCHIATRIC: The patient has a normal affect.  STUDIES:   I have reviewed the following: +-----------------+-------------+----------+---------+ Right Cephalic   Diameter (cm)Depth (cm)Findings  +-----------------+-------------+----------+---------+ Shoulder             0.59               branching +-----------------+-------------+----------+---------+ Prox upper arm       0.46                          +-----------------+-------------+----------+---------+ Mid upper arm        0.49                         +-----------------+-------------+----------+---------+ Dist upper arm    0.60 / 0.51           branching +-----------------+-------------+----------+---------+ Antecubital fossa    0.32                         +-----------------+-------------+----------+---------+ Prox forearm         0.29                         +-----------------+-------------+----------+---------+ Mid forearm          0.29                         +-----------------+-------------+----------+---------+ Dist forearm      0.27 / 0.21                     +-----------------+-------------+----------+---------+ Wrist                0.23               branching +-----------------+-------------+----------+---------+  +-----------------+-------------+----------+------------------------+ Right Basilic    Diameter (cm)Depth (cm)        Findings         +-----------------+-------------+----------+------------------------+ Prox upper arm  0.37                                        +-----------------+-------------+----------+------------------------+ Mid upper arm        0.34               branching and curves m-d +-----------------+-------------+----------+------------------------+ Dist upper arm       0.45                        curves          +-----------------+-------------+----------+------------------------+ Antecubital fossa    0.28                                        +-----------------+-------------+----------+------------------------+ Prox forearm         0.24                                        +-----------------+-------------+----------+------------------------+  +-----------------+-------------+----------+--------------+ Left Cephalic    Diameter (cm)Depth (cm)   Findings     +-----------------+-------------+----------+--------------+ Shoulder             0.61                              +-----------------+-------------+----------+--------------+ Prox upper arm       0.55                              +-----------------+-------------+----------+--------------+ Mid upper arm     0.53 / 0.51             branching    +-----------------+-------------+----------+--------------+ Dist upper arm       0.38                 branching    +-----------------+-------------+----------+--------------+ Antecubital fossa    0.27                              +-----------------+-------------+----------+--------------+ Prox forearm         0.29                              +-----------------+-------------+----------+--------------+ Mid forearm       0.17 / 0.23             branching    +-----------------+-------------+----------+--------------+ Dist forearm                            not visualized +-----------------+-------------+----------+--------------+  +-----------------+-------------+----------+---------+ Left Basilic     Diameter (cm)Depth (cm)Findings  +-----------------+-------------+----------+---------+ Prox upper arm       0.59               branching +-----------------+-------------+----------+---------+ Mid upper arm     0.58 / 0.61           branching +-----------------+-------------+----------+---------+ Dist upper arm       0.64               branching +-----------------+-------------+----------+---------+ Antecubital fossa  0.33                         +-----------------+-------------+----------+---------+ Prox forearm         0.29               branching +-----------------+-------------+----------+---------+  ARTERIAL :Left Pre-Dialysis Findings: +-----------------------+----------+--------------------+---------+--------+ Location               PSV (cm/s)Intralum. Diam.  (cm)Waveform Comments +-----------------------+----------+--------------------+---------+--------+ Brachial Antecub. fossa86        0.45                triphasic         +-----------------------+----------+--------------------+---------+--------+ Radial Art at Wrist    67        0.30                triphasic         +-----------------------+----------+--------------------+---------+--------+ Ulnar Art at Wrist     59        0.23                triphasic         +-----------------------+----------+--------------------+---------+--------+   ASSESSMENT and PLAN   End-stage renal disease: I discussed with the patient that we need to proceed with a left arm fistula.  I discussed placing either a radiocephalic or brachiocephalic based on the appearance of the cephalic vein in the arm.  I discussed the possibility of future interventions as well as the risk of nonmaturity.  All of his questions were answered.  His operation will be scheduled in early January on a nondialysis day.   Leia Alf, MD, FACS [facility] [phone] Pager [phone]

## 2019-10-17 NOTE — H&P (View-Only) (Signed)
V[facility] and Vein Specialist of [facility]  Patient name: Kelly Key MRN: [id number] DOB: 26-Sep-1971 Sex: male   REQUESTING PROVIDER:    Renal   REASON FOR CONSULT:    dialysis  HISTORY OF PRESENT ILLNESS:   Kelly Key is a 48 y.o. male, who is referred for evaluation of dialysis access.  Patient is renal failure secondary to hypertension and diabetes.  His diabetes is poorly controlled.  He is a non-smoker.  He takes a statin for hypercholesterolemia.  He is right-handed.  He currently is on dialysis via a right sided catheter on Tuesday Thursday Saturday.  PAST MEDICAL HISTORY    Past Medical History:  Diagnosis Date  . Acute diastolic CHF (congestive heart failure) ([place]) 06/11/2012   EF 50-55% [facility])  . Diabetes mellitus    A1c 11.5 (06/11/2012).  . Gout   . Hepatic steatosis 06/11/2012   Elevated LFTs  . Hyperlipemia   . Malignant hypertension   . Microcytic anemia 06/12/2012  . Obesity      FAMILY HISTORY   Family History  Problem Relation Age of Onset  . Gout Mother   . Asthma Mother   . Diabetes Father   . Heart failure Father   . Diabetes Sister   . Hypertension Brother   . Pancreatic cancer Brother   . Diabetes Sister     SOCIAL HISTORY:   Social History   Socioeconomic History  . Marital status: Single    Spouse name: Not on file  . Number of children: Not on file  . Years of education: Not on file  . Highest education level: Not on file  Occupational History  . Occupation: retired  Tobacco Use  . Smoking status: Never Smoker  . Smokeless tobacco: Never Used  Substance and Sexual Activity  . Alcohol use: No    Comment: haven't drank in over 5 years   . Drug use: No  . Sexual activity: Not Currently  Other Topics Concern  . Not on file  Social History Narrative  . Not on file   Social Determinants of Health   Financial Resource Strain: Low Risk   . Difficulty of Paying Living  Expenses: Not hard at all  Food Insecurity: Unknown  . Worried About Charity fundraiser in the Last Year: Patient refused  . Ran Out of Food in the Last Year: Patient refused  Transportation Needs: Unknown  . Lack of Transportation (Medical): Patient refused  . Lack of Transportation (Non-Medical): Patient refused  Physical Activity: Unknown  . Days of Exercise per Week: Patient refused  . Minutes of Exercise per Session: Patient refused  Stress: No Stress Concern Present  . Feeling of Stress : Not at all  Social Connections: Unknown  . Frequency of Communication with Friends and Family: Patient refused  . Frequency of Social Gatherings with Friends and Family: Patient refused  . Attends Religious Services: Patient refused  . Active Member of Clubs or Organizations: Patient refused  . Attends Archivist Meetings: Patient refused  . Marital Status: Patient refused  Intimate Partner Violence: Unknown  . Fear of Current or Ex-Partner: Patient refused  . Emotionally Abused: Patient refused  . Physically Abused: Patient refused  . Sexually Abused: Patient refused    ALLERGIES:    Allergies  Allergen Reactions  . Hydralazine   . Lisinopril Swelling    CURRENT MEDICATIONS:    Current Outpatient Medications  Medication Sig Dispense Refill  . acetaminophen (TYLENOL) 325  MG tablet Take 1-2 tablets (325-650 mg total) by mouth every 4 (four) hours as needed for mild pain.    [name] albuterol (VENTOLIN HFA) 108 (90 Base) MCG/ACT inhaler Inhale 2 puffs into the lungs every 4 (four) hours as needed.    [name] allopurinol (ZYLOPRIM) 100 MG tablet Take 2 tablets (200 mg total) by mouth daily. 30 tablet 0  . amLODipine (NORVASC) 5 MG tablet Take 1 tablet (5 mg total) by mouth daily. 30 tablet 0  . aspirin EC 81 MG EC tablet Take 1 tablet (81 mg total) by mouth daily. 30 tablet 0  . atorvastatin (LIPITOR) 80 MG tablet Take 1 tablet (80 mg total) by mouth daily. 30 tablet 0  . Calcium  Carbonate Antacid (CALCIUM CARBONATE, DOSED IN MG ELEMENTAL CALCIUM,) 1250 MG/5ML SUSP Take 5 mLs (500 mg of elemental calcium total) by mouth every 6 (six) hours as needed for indigestion. 450 mL   . carvedilol (COREG) 25 MG tablet Take 1 tablet (25 mg total) by mouth 2 (two) times daily with a meal. 60 tablet 0  . diclofenac sodium (VOLTAREN) 1 % GEL Apply 2 g topically 4 (four) times daily.    [name] ezetimibe (ZETIA) 10 MG tablet Take 10 mg by mouth every evening.    . furosemide (LASIX) 80 MG tablet Take 80 mg by mouth daily.    [name] gabapentin (NEURONTIN) 100 MG capsule Take 1 capsule (100 mg total) by mouth 3 (three) times daily. 90 capsule 0  . HYDROcodone-acetaminophen (NORCO/VICODIN) 5-325 MG tablet Take 1 tablet by mouth every 12 (twelve) hours as needed for severe pain. 10 tablet 0  . insulin aspart (NOVOLOG) 100 UNIT/ML injection Inject 5-15 Units into the skin 3 (three) times daily before meals.     . insulin detemir (LEVEMIR) 100 UNIT/ML injection Inject 0.38 mLs (38 Units total) into the skin 2 (two) times daily. 10 mL 1  . Insulin Pen Needle 31G X 5 MM MISC Use as directed. 100 each 0  . linaclotide (LINZESS) 145 MCG CAPS capsule Take 1 capsule (145 mcg total) by mouth daily before breakfast. 30 capsule 0  . multivitamin (RENA-VIT) TABS tablet Take 1 tablet by mouth at bedtime. 30 tablet 0  . sevelamer carbonate (RENVELA) 800 MG tablet Take 3 tablets (2,400 mg total) by mouth 3 (three) times daily with meals. 270 tablet 0  . cloNIDine (CATAPRES) 0.3 MG tablet Take 1 tablet (0.3 mg total) by mouth 3 (three) times daily. 90 tablet 0  . fluticasone (FLONASE) 50 MCG/ACT nasal spray Place 1 spray into both nostrils daily. 1 g 0   No current facility-administered medications for this visit.    REVIEW OF SYSTEMS:   [X]  denotes positive finding, [ ]  denotes negative finding Cardiac  Comments:  Chest pain or chest pressure:    Shortness of breath upon exertion:    Short of breath when lying  flat:    Irregular heart rhythm:        Vascular    Pain in calf, thigh, or hip brought on by ambulation:    Pain in feet at night that wakes you up from your sleep:     Blood clot in your veins:    Leg swelling:         Pulmonary    Oxygen at home:    Productive cough:     Wheezing:         Neurologic    Sudden weakness in arms or legs:  Sudden numbness in arms or legs:     Sudden onset of difficulty speaking or slurred speech:    Temporary loss of vision in one eye:     Problems with dizziness:         Gastrointestinal    Blood in stool:      Vomited blood:         Genitourinary    Burning when urinating:     Blood in urine:        Psychiatric    Major depression:         Hematologic    Bleeding problems:    Problems with blood clotting too easily:        Skin    Rashes or ulcers:        Constitutional    Fever or chills:     PHYSICAL EXAM:   Vitals:   10/17/19 1210  BP: (!) 172/102  Pulse: 90  Resp: 20  Temp: 98.1 F (36.7 C)  SpO2: 98%  Weight: (!) 310 lb (140.6 kg)  Height: 5\' 11"  (1.803 m)    GENERAL: The patient is a well-nourished male, in no acute distress. The vital signs are documented above. CARDIAC: There is a regular rate and rhythm.  VASCULAR: Palpable radial pulses. PULMONARY: Nonlabored respirations MUSCULOSKELETAL: There are no major deformities or cyanosis. NEUROLOGIC: No focal weakness or paresthesias are detected. SKIN: There are no ulcers or rashes noted. PSYCHIATRIC: The patient has a normal affect.  STUDIES:   I have reviewed the following: +-----------------+-------------+----------+---------+ Right Cephalic   Diameter (cm)Depth (cm)Findings  +-----------------+-------------+----------+---------+ Shoulder             0.59               branching +-----------------+-------------+----------+---------+ Prox upper arm       0.46                          +-----------------+-------------+----------+---------+ Mid upper arm        0.49                         +-----------------+-------------+----------+---------+ Dist upper arm    0.60 / 0.51           branching +-----------------+-------------+----------+---------+ Antecubital fossa    0.32                         +-----------------+-------------+----------+---------+ Prox forearm         0.29                         +-----------------+-------------+----------+---------+ Mid forearm          0.29                         +-----------------+-------------+----------+---------+ Dist forearm      0.27 / 0.21                     +-----------------+-------------+----------+---------+ Wrist                0.23               branching +-----------------+-------------+----------+---------+  +-----------------+-------------+----------+------------------------+ Right Basilic    Diameter (cm)Depth (cm)        Findings         +-----------------+-------------+----------+------------------------+ Prox upper arm  0.37                                        +-----------------+-------------+----------+------------------------+ Mid upper arm        0.34               branching and curves m-d +-----------------+-------------+----------+------------------------+ Dist upper arm       0.45                        curves          +-----------------+-------------+----------+------------------------+ Antecubital fossa    0.28                                        +-----------------+-------------+----------+------------------------+ Prox forearm         0.24                                        +-----------------+-------------+----------+------------------------+  +-----------------+-------------+----------+--------------+ Left Cephalic    Diameter (cm)Depth (cm)   Findings     +-----------------+-------------+----------+--------------+ Shoulder             0.61                              +-----------------+-------------+----------+--------------+ Prox upper arm       0.55                              +-----------------+-------------+----------+--------------+ Mid upper arm     0.53 / 0.51             branching    +-----------------+-------------+----------+--------------+ Dist upper arm       0.38                 branching    +-----------------+-------------+----------+--------------+ Antecubital fossa    0.27                              +-----------------+-------------+----------+--------------+ Prox forearm         0.29                              +-----------------+-------------+----------+--------------+ Mid forearm       0.17 / 0.23             branching    +-----------------+-------------+----------+--------------+ Dist forearm                            not visualized +-----------------+-------------+----------+--------------+  +-----------------+-------------+----------+---------+ Left Basilic     Diameter (cm)Depth (cm)Findings  +-----------------+-------------+----------+---------+ Prox upper arm       0.59               branching +-----------------+-------------+----------+---------+ Mid upper arm     0.58 / 0.61           branching +-----------------+-------------+----------+---------+ Dist upper arm       0.64               branching +-----------------+-------------+----------+---------+ Antecubital fossa  0.33                         +-----------------+-------------+----------+---------+ Prox forearm         0.29               branching +-----------------+-------------+----------+---------+  ARTERIAL :Left Pre-Dialysis Findings: +-----------------------+----------+--------------------+---------+--------+ Location               PSV (cm/s)Intralum. Diam.  (cm)Waveform Comments +-----------------------+----------+--------------------+---------+--------+ Brachial Antecub. fossa86        0.45                triphasic         +-----------------------+----------+--------------------+---------+--------+ Radial Art at Wrist    67        0.30                triphasic         +-----------------------+----------+--------------------+---------+--------+ Ulnar Art at Wrist     59        0.23                triphasic         +-----------------------+----------+--------------------+---------+--------+   ASSESSMENT and PLAN   End-stage renal disease: I discussed with the patient that we need to proceed with a left arm fistula.  I discussed placing either a radiocephalic or brachiocephalic based on the appearance of the cephalic vein in the arm.  I discussed the possibility of future interventions as well as the risk of nonmaturity.  All of his questions were answered.  His operation will be scheduled in early January on a nondialysis day.   Leia Alf, MD, FACS [facility] and Vein Specialists of [facility] [phone] Pager [phone]

## 2019-10-18 ENCOUNTER — Telehealth: Payer: Self-pay | Admitting: *Deleted

## 2019-10-18 NOTE — Telephone Encounter (Signed)
Faxed pre-op letter received by Brayton Layman at [facility]. They will review with patient.

## 2019-11-09 ENCOUNTER — Other Ambulatory Visit: Payer: Self-pay

## 2019-11-09 ENCOUNTER — Other Ambulatory Visit (HOSPITAL_COMMUNITY)
Admission: RE | Admit: 2019-11-09 | Discharge: 2019-11-09 | Disposition: A | Discharge status: Home / Self Care | Payer: Medicare Other | Source: Ambulatory Visit | Attending: Surgery | Admitting: Surgery

## 2019-11-09 ENCOUNTER — Encounter (HOSPITAL_COMMUNITY): Payer: Self-pay | Admitting: Surgery

## 2019-11-09 DIAGNOSIS — Z01812 Encounter for preprocedural laboratory examination: Secondary | ICD-10-CM | POA: Insufficient documentation

## 2019-11-09 DIAGNOSIS — Z20822 Contact with and (suspected) exposure to covid-19: Secondary | ICD-10-CM | POA: Diagnosis not present

## 2019-11-09 LAB — SARS CORONAVIRUS 2 (TAT 6-24 HRS): SARS Coronavirus 2: NEGATIVE

## 2019-11-10 MED ORDER — DEXTROSE 5 % IV SOLN
3.0000 g | INTRAVENOUS | Status: AC
  Administered 2019-11-11: 3 g via INTRAVENOUS
  Filled 2019-11-10: qty 3

## 2019-11-11 ENCOUNTER — Ambulatory Visit (HOSPITAL_COMMUNITY): Payer: Medicare Other | Admitting: Anesthesiology

## 2019-11-11 ENCOUNTER — Other Ambulatory Visit: Payer: Self-pay

## 2019-11-11 ENCOUNTER — Encounter (HOSPITAL_COMMUNITY)
Admission: RE | Disposition: A | Discharge status: Home / Self Care | Payer: Self-pay | Source: Home / Self Care | Attending: Surgery

## 2019-11-11 ENCOUNTER — Encounter (HOSPITAL_COMMUNITY): Payer: Self-pay | Admitting: Surgery

## 2019-11-11 ENCOUNTER — Ambulatory Visit (HOSPITAL_COMMUNITY)
Admission: RE | Admit: 2019-11-11 | Discharge: 2019-11-11 | Disposition: A | Discharge status: Home / Self Care | Payer: Medicare Other | Attending: Surgery | Admitting: Surgery

## 2019-11-11 DIAGNOSIS — E669 Obesity, unspecified: Secondary | ICD-10-CM | POA: Diagnosis not present

## 2019-11-11 DIAGNOSIS — Z794 Long term (current) use of insulin: Secondary | ICD-10-CM | POA: Insufficient documentation

## 2019-11-11 DIAGNOSIS — E1122 Type 2 diabetes mellitus with diabetic chronic kidney disease: Secondary | ICD-10-CM | POA: Diagnosis not present

## 2019-11-11 DIAGNOSIS — K760 Fatty (change of) liver, not elsewhere classified: Secondary | ICD-10-CM | POA: Insufficient documentation

## 2019-11-11 DIAGNOSIS — M109 Gout, unspecified: Secondary | ICD-10-CM | POA: Insufficient documentation

## 2019-11-11 DIAGNOSIS — Z79899 Other long term (current) drug therapy: Secondary | ICD-10-CM | POA: Insufficient documentation

## 2019-11-11 DIAGNOSIS — Z6841 Body Mass Index (BMI) 40.0 and over, adult: Secondary | ICD-10-CM | POA: Insufficient documentation

## 2019-11-11 DIAGNOSIS — N185 Chronic kidney disease, stage 5: Secondary | ICD-10-CM | POA: Diagnosis not present

## 2019-11-11 DIAGNOSIS — I5032 Chronic diastolic (congestive) heart failure: Secondary | ICD-10-CM | POA: Insufficient documentation

## 2019-11-11 DIAGNOSIS — Z7982 Long term (current) use of aspirin: Secondary | ICD-10-CM | POA: Insufficient documentation

## 2019-11-11 DIAGNOSIS — E785 Hyperlipidemia, unspecified: Secondary | ICD-10-CM | POA: Insufficient documentation

## 2019-11-11 DIAGNOSIS — E7800 Pure hypercholesterolemia, unspecified: Secondary | ICD-10-CM | POA: Insufficient documentation

## 2019-11-11 DIAGNOSIS — Z992 Dependence on renal dialysis: Secondary | ICD-10-CM | POA: Insufficient documentation

## 2019-11-11 DIAGNOSIS — I132 Hypertensive heart and chronic kidney disease with heart failure and with stage 5 chronic kidney disease, or end stage renal disease: Secondary | ICD-10-CM | POA: Insufficient documentation

## 2019-11-11 DIAGNOSIS — N186 End stage renal disease: Secondary | ICD-10-CM | POA: Diagnosis not present

## 2019-11-11 HISTORY — PX: AV FISTULA PLACEMENT: SHX1204

## 2019-11-11 HISTORY — DX: Dyspnea, unspecified: R06.00

## 2019-11-11 HISTORY — DX: Chronic obstructive pulmonary disease, unspecified: J44.9

## 2019-11-11 HISTORY — DX: Chronic kidney disease, unspecified: N18.9

## 2019-11-11 LAB — POCT I-STAT, CHEM 8
BUN: 30 mg/dL — ABNORMAL HIGH (ref 6–20)
Calcium, Ion: 1.1 mmol/L — ABNORMAL LOW (ref 1.15–1.40)
Chloride: 100 mmol/L (ref 98–111)
Creatinine, Ser: 4.9 mg/dL — ABNORMAL HIGH (ref 0.61–1.24)
Glucose, Bld: 121 mg/dL — ABNORMAL HIGH (ref 70–99)
HCT: 33 % — ABNORMAL LOW (ref 39.0–52.0)
Hemoglobin: 11.2 g/dL — ABNORMAL LOW (ref 13.0–17.0)
Potassium: 3.7 mmol/L (ref 3.5–5.1)
Sodium: 138 mmol/L (ref 135–145)
TCO2: 27 mmol/L (ref 22–32)

## 2019-11-11 LAB — SURGICAL PCR SCREEN
MRSA, PCR: NEGATIVE
Staphylococcus aureus: NEGATIVE

## 2019-11-11 LAB — GLUCOSE, CAPILLARY: Glucose-Capillary: 94 mg/dL (ref 70–99)

## 2019-11-11 SURGERY — ARTERIOVENOUS (AV) FISTULA CREATION
Anesthesia: General | Site: Arm Upper | Laterality: Left

## 2019-11-11 MED ORDER — OXYCODONE HCL 5 MG/5ML PO SOLN: 5.0000 mg | Freq: Once | ORAL | Status: AC | PRN

## 2019-11-11 MED ORDER — MUPIROCIN 2 % EX OINT
TOPICAL_OINTMENT | CUTANEOUS | Status: AC
  Administered 2019-11-11: 1 via NASAL
  Filled 2019-11-11: qty 22

## 2019-11-11 MED ORDER — MIDAZOLAM HCL 5 MG/5ML IJ SOLN
INTRAMUSCULAR | Status: DC | PRN
  Administered 2019-11-11: 2 mg via INTRAVENOUS

## 2019-11-11 MED ORDER — ONDANSETRON HCL 4 MG/2ML IJ SOLN
INTRAMUSCULAR | Status: DC | PRN
  Administered 2019-11-11: 4 mg via INTRAVENOUS

## 2019-11-11 MED ORDER — SODIUM CHLORIDE 0.9 % IV SOLN
INTRAVENOUS | Status: DC | PRN
  Administered 2019-11-11: 500 mL

## 2019-11-11 MED ORDER — OXYCODONE HCL 5 MG PO TABS
5.0000 mg | ORAL_TABLET | Freq: Once | ORAL | Status: AC | PRN
  Administered 2019-11-11: 5 mg via ORAL

## 2019-11-11 MED ORDER — PHENYLEPHRINE HCL (PRESSORS) 10 MG/ML IV SOLN
INTRAVENOUS | Status: DC | PRN
  Administered 2019-11-11: 40 ug via INTRAVENOUS
  Administered 2019-11-11 (×2): 80 ug via INTRAVENOUS

## 2019-11-11 MED ORDER — FENTANYL CITRATE (PF) 250 MCG/5ML IJ SOLN
INTRAMUSCULAR | Status: AC
  Filled 2019-11-11: qty 5

## 2019-11-11 MED ORDER — CHLORHEXIDINE GLUCONATE 4 % EX LIQD: 60.0000 mL | Freq: Once | CUTANEOUS | Status: DC

## 2019-11-11 MED ORDER — PROPOFOL 10 MG/ML IV BOLUS: INTRAVENOUS | Status: DC | PRN

## 2019-11-11 MED ORDER — FENTANYL CITRATE (PF) 100 MCG/2ML IJ SOLN: 25.0000 ug | INTRAMUSCULAR | Status: DC | PRN

## 2019-11-11 MED ORDER — SODIUM CHLORIDE 0.9 % IV SOLN: INTRAVENOUS | Status: DC | PRN

## 2019-11-11 MED ORDER — MUPIROCIN 2 % EX OINT
TOPICAL_OINTMENT | Freq: Once | CUTANEOUS | Status: AC
  Filled 2019-11-11: qty 22

## 2019-11-11 MED ORDER — MUPIROCIN 2 % EX OINT
1.0000 "application " | TOPICAL_OINTMENT | Freq: Once | CUTANEOUS | Status: AC
  Administered 2019-11-11: 1 via TOPICAL

## 2019-11-11 MED ORDER — HYDROCODONE-ACETAMINOPHEN 5-325 MG PO TABS: 1.0000 | ORAL_TABLET | Freq: Four times a day (QID) | ORAL | 0 refills | Status: DC | PRN

## 2019-11-11 MED ORDER — ONDANSETRON HCL 4 MG/2ML IJ SOLN: 4.0000 mg | Freq: Four times a day (QID) | INTRAMUSCULAR | Status: DC | PRN

## 2019-11-11 MED ORDER — LIDOCAINE-EPINEPHRINE 1 %-1:100000 IJ SOLN
INTRAMUSCULAR | Status: AC
  Filled 2019-11-11: qty 1

## 2019-11-11 MED ORDER — FENTANYL CITRATE (PF) 100 MCG/2ML IJ SOLN
INTRAMUSCULAR | Status: DC | PRN
  Administered 2019-11-11: 25 ug via INTRAVENOUS
  Administered 2019-11-11: 50 ug via INTRAVENOUS

## 2019-11-11 MED ORDER — 0.9 % SODIUM CHLORIDE (POUR BTL) OPTIME
TOPICAL | Status: DC | PRN
  Administered 2019-11-11: 1000 mL

## 2019-11-11 MED ORDER — SODIUM CHLORIDE 0.9 % IV SOLN: INTRAVENOUS | Status: DC

## 2019-11-11 MED ORDER — MIDAZOLAM HCL 2 MG/2ML IJ SOLN
INTRAMUSCULAR | Status: AC
  Filled 2019-11-11: qty 2

## 2019-11-11 MED ORDER — OXYCODONE HCL 5 MG PO TABS
ORAL_TABLET | ORAL | Status: AC
  Filled 2019-11-11: qty 1

## 2019-11-11 MED ORDER — PHENYLEPHRINE HCL-NACL 10-0.9 MG/250ML-% IV SOLN
INTRAVENOUS | Status: DC | PRN
  Administered 2019-11-11: 25 ug/min via INTRAVENOUS

## 2019-11-11 MED ORDER — LIDOCAINE HCL (CARDIAC) PF 100 MG/5ML IV SOSY
PREFILLED_SYRINGE | INTRAVENOUS | Status: DC | PRN
  Administered 2019-11-11: 40 mg via INTRAVENOUS

## 2019-11-11 MED ORDER — SODIUM CHLORIDE 0.9 % IV SOLN
INTRAVENOUS | Status: AC
  Filled 2019-11-11: qty 1.2

## 2019-11-11 MED ORDER — PROPOFOL 10 MG/ML IV BOLUS
INTRAVENOUS | Status: AC
  Filled 2019-11-11: qty 40

## 2019-11-11 MED ORDER — PROPOFOL 10 MG/ML IV BOLUS
INTRAVENOUS | Status: DC | PRN
  Administered 2019-11-11: 20 mg via INTRAVENOUS
  Administered 2019-11-11: 40 mg via INTRAVENOUS
  Administered 2019-11-11: 160 mg via INTRAVENOUS

## 2019-11-11 SURGICAL SUPPLY — 31 items
ADH SKN CLS APL DERMABOND .7 (GAUZE/BANDAGES/DRESSINGS) ×1
ARMBAND PINK RESTRICT EXTREMIT (MISCELLANEOUS) ×4 IMPLANT
CANISTER SUCT 3000ML PPV (MISCELLANEOUS) ×2 IMPLANT
CLIP VESOCCLUDE MED 6/CT (CLIP) ×2 IMPLANT
CLIP VESOCCLUDE SM WIDE 6/CT (CLIP) ×2 IMPLANT
COVER PROBE W GEL 5X96 (DRAPES) ×2 IMPLANT
COVER WAND RF STERILE (DRAPES) ×2 IMPLANT
DERMABOND ADVANCED (GAUZE/BANDAGES/DRESSINGS) ×1
DERMABOND ADVANCED .7 DNX12 (GAUZE/BANDAGES/DRESSINGS) ×1 IMPLANT
ELECT REM PT RETURN 9FT ADLT (ELECTROSURGICAL) ×2
ELECTRODE REM PT RTRN 9FT ADLT (ELECTROSURGICAL) ×1 IMPLANT
GLOVE BIOGEL PI IND STRL 7.5 (GLOVE) ×1 IMPLANT
GLOVE BIOGEL PI INDICATOR 7.5 (GLOVE) ×1
GLOVE SURG SS PI 7.5 STRL IVOR (GLOVE) ×2 IMPLANT
GOWN STRL REUS W/ TWL LRG LVL3 (GOWN DISPOSABLE) ×2 IMPLANT
GOWN STRL REUS W/ TWL XL LVL3 (GOWN DISPOSABLE) ×1 IMPLANT
GOWN STRL REUS W/TWL LRG LVL3 (GOWN DISPOSABLE) ×4
GOWN STRL REUS W/TWL XL LVL3 (GOWN DISPOSABLE) ×2
HEMOSTAT SNOW SURGICEL 2X4 (HEMOSTASIS) IMPLANT
KIT BASIN OR (CUSTOM PROCEDURE TRAY) ×2 IMPLANT
KIT TURNOVER KIT B (KITS) ×2 IMPLANT
NS IRRIG 1000ML POUR BTL (IV SOLUTION) ×2 IMPLANT
PACK CV ACCESS (CUSTOM PROCEDURE TRAY) ×2 IMPLANT
PAD ARMBOARD 7.5X6 YLW CONV (MISCELLANEOUS) ×4 IMPLANT
SUT PROLENE 6 0 CC (SUTURE) ×2 IMPLANT
SUT VIC AB 3-0 SH 27 (SUTURE) ×2
SUT VIC AB 3-0 SH 27X BRD (SUTURE) ×1 IMPLANT
SUT VICRYL 4-0 PS2 18IN ABS (SUTURE) ×2 IMPLANT
TOWEL GREEN STERILE (TOWEL DISPOSABLE) ×2 IMPLANT
UNDERPAD 30X30 (UNDERPADS AND DIAPERS) ×2 IMPLANT
WATER STERILE IRR 1000ML POUR (IV SOLUTION) ×2 IMPLANT

## 2019-11-11 NOTE — Op Note (Signed)
    Patient name: Kelly Key MRN: [id number] DOB: 19-Jun-1971 Sex: male  11/11/2019 Pre-operative Diagnosis: ESRD Post-operative diagnosis:  Same Surgeon:  [name] Assistants: Arlee Muslim Procedure:   Left brachiocephalic fistula Anesthesia: General Blood Loss: None Specimens: None  Findings: The cephalic vein was adequate down by the wrist however the radial artery was circumferentially calcified and therefore I elected to use the brachial artery.  He had an excellent 5 mm vein at this level.  In addition the brachial artery was about 4.5 mm but was very thickened.  Indications: Patient comes in today for fistula creation implanted  Procedure:  The patient was identified in the holding area and taken to [place] [address]  The patient was then placed supine on the table. general anesthesia was administered.  The patient was prepped and draped in the usual sterile fashion.  A time out was called and antibiotics were administered.  Ultrasound used to evaluate the cephalic vein in the upper arm.  Minimal about 3 mm at the wristAn 5 mm antecubital crease.  The artery at the wrist appeared to be heavily calcified circumferentially and so I elected to use the brachial artery itself.  A transverse incision was made antecubital crease.  I first dissected out the brachial artery which is a 4.5 mm artery.  I then exposed the cephalic vein.  This was an excellent appearing vein measuring about 5 mm.  It was mobilized proximally distally throughout the length of the incision.  It was then marked for orientation.  It was ligated with a 2-0 silk.  Next the brachial artery was occluded with vascular clamps.  #11 was used to make an arteriotomy which extended longitudinally with Potts scissors.  The vein was relatively thickened on the inside.  The vein was cut the appropriate length and spatulated to size the arteriotomy.  A running anastomosis was created with 6-0 Prolene.  Just prior to completion, the  appropriate flushing maneuvers were performed and the anastomosis was completed.  The clamps were released.  There was next through the fistula.  The patient had a brisk radial artery signal.  There was irrigated.  Hemostasis was achieved.  Incision was closed with 2 layers of 3-0 Vicryl followed by Dermabond.  There were no immediate complications.   Disposition: To PACU stable.   Theotis Burrow, M.D., [facility] Vein Specialists of [place] Office: [phone] Pager:  [phone]

## 2019-11-11 NOTE — Transfer of Care (Signed)
Immediate Anesthesia Transfer of Care Note  Patient: Kelly Key  Procedure(s) Performed: ARTERIOVENOUS (AV) FISTULA CREATION LEFT BRACHIOCEPHALIC ARM (Left Arm Upper)  Patient Location: PACU  Anesthesia Type:General  Level of Consciousness: awake and sedated  Airway & Oxygen Therapy: Patient Spontanous Breathing and Patient connected to face mask oxygen  Post-op Assessment: Report given to RN, Post -op Vital signs reviewed and stable and Patient moving all extremities  Post vital signs: Reviewed and stable  Last Vitals:  Vitals Value Taken Time  BP 115/70 11/11/19 0904  Temp    Pulse 65 11/11/19 0907  Resp 17 11/11/19 0907  SpO2 100 % 11/11/19 0907  Vitals shown include unvalidated device data.  Last Pain:  Vitals:   11/11/19 0658  TempSrc:   PainSc: 0-No pain      Patients Stated Pain Goal: 5 ([id number])  Complications: No apparent anesthesia complications

## 2019-11-11 NOTE — Anesthesia Procedure Notes (Signed)
Procedure Name: LMA Insertion Date/Time: 11/11/2019 7:51 AM Performed by: Scheryl Darter, CRNA Pre-anesthesia Checklist: Patient identified, Emergency Drugs available, Suction available and Patient being monitored Patient Re-evaluated:Patient Re-evaluated prior to induction Oxygen Delivery Method: Circle System Utilized Preoxygenation: Pre-oxygenation with 100% oxygen Induction Type: IV induction Ventilation: Mask ventilation without difficulty LMA: LMA inserted LMA Size: 5.0 Number of attempts: 1 Airway Equipment and Method: Bite block Placement Confirmation: positive ETCO2 Tube secured with: Tape Dental Injury: Teeth and Oropharynx as per pre-operative assessment

## 2019-11-11 NOTE — Anesthesia Preprocedure Evaluation (Signed)
Anesthesia Evaluation  Patient identified by MRN, date of birth, ID band Patient awake    Reviewed: Allergy & Precautions, H&P , NPO status , Patient's Chart, lab work & pertinent test results  Airway Mallampati: II   Neck ROM: full    Dental   Pulmonary shortness of breath, COPD,    breath sounds clear to auscultation       Cardiovascular hypertension, +CHF   Rhythm:regular Rate:Normal     Neuro/Psych  Neuromuscular disease    GI/Hepatic Hepatic steatosis   Endo/Other  diabetes  Renal/GU ESRFRenal disease     Musculoskeletal  (+) Arthritis ,   Abdominal   Peds  Hematology  (+) Blood dyscrasia, anemia ,   Anesthesia Other Findings   Reproductive/Obstetrics                             Anesthesia Physical Anesthesia Plan  ASA: III  Anesthesia Plan: General   Post-op Pain Management:    Induction: Intravenous  PONV Risk Score and Plan: 2 and Ondansetron, Dexamethasone, Midazolam and Treatment may vary due to age or medical condition  Airway Management Planned: LMA  Additional Equipment:   Intra-op Plan:   Post-operative Plan: Extubation in OR  Informed Consent: I have reviewed the patients History and Physical, chart, labs and discussed the procedure including the risks, benefits and alternatives for the proposed anesthesia with the patient or authorized representative who has indicated his/her understanding and acceptance.       Plan Discussed with: Anesthesiologist, Surgeon and CRNA  Anesthesia Plan Comments:         Anesthesia Quick Evaluation

## 2019-11-11 NOTE — Anesthesia Postprocedure Evaluation (Signed)
Anesthesia Post Note  Patient: Kelly Key  Procedure(s) Performed: ARTERIOVENOUS (AV) FISTULA CREATION LEFT BRACHIOCEPHALIC ARM (Left Arm Upper)     Patient location during evaluation: PACU Anesthesia Type: General Level of consciousness: awake and alert Pain management: pain level controlled Vital Signs Assessment: post-procedure vital signs reviewed and stable Respiratory status: spontaneous breathing, nonlabored ventilation, respiratory function stable and patient connected to nasal cannula oxygen Cardiovascular status: blood pressure returned to baseline and stable Postop Assessment: no apparent nausea or vomiting Anesthetic complications: no    Last Vitals:  Vitals:   11/11/19 1018 11/11/19 1033  BP: (!) 130/96 (!) 132/91  Pulse: 66   Resp: 16   Temp: 36.6 C 36.6 C  SpO2: 100% 94%    Last Pain:  Vitals:   11/11/19 0658  TempSrc:   PainSc: 0-No pain                 [name] S

## 2019-11-11 NOTE — Discharge Instructions (Signed)
° °  [facility]ists of [place] ° °Discharge Instructions ° °AV Fistula or Graft Surgery for Dialysis Access ° °Please refer to the following instructions for your post-procedure care. Your surgeon or physician assistant will discuss any changes with you. ° °Activity ° °You may drive the day following your surgery, if you are comfortable and no longer taking prescription pain medication. Resume full activity as the soreness in your incision resolves. ° °Bathing/Showering ° °You may shower after you go home. Keep your incision dry for 48 hours. Do not soak in a bathtub, hot tub, or swim until the incision heals completely. You may not shower if you have a hemodialysis catheter. ° °Incision Care ° °Clean your incision with mild soap and water after 48 hours. Pat the area dry with a clean towel. You do not need a bandage unless otherwise instructed. Do not apply any ointments or creams to your incision. You may have skin glue on your incision. Do not peel it off. It will come off on its own in about one week. Your arm may swell a bit after surgery. To reduce swelling use pillows to elevate your arm so it is above your heart. Your doctor will tell you if you need to lightly wrap your arm with an ACE bandage. ° °Diet ° °Resume your normal diet. There are not special food restrictions following this procedure. In order to heal from your surgery, it is CRITICAL to get adequate nutrition. Your body requires vitamins, minerals, and protein. Vegetables are the best source of vitamins and minerals. Vegetables also provide the perfect balance of protein. Processed food has little nutritional value, so try to avoid this. ° °Medications ° °Resume taking all of your medications. If your incision is causing pain, you may take over-the counter pain relievers such as acetaminophen (Tylenol). If you were prescribed a stronger pain medication, please be aware these medications can cause nausea and constipation. Prevent  nausea by taking the medication with a snack or meal. Avoid constipation by drinking plenty of fluids and eating foods with high amount of fiber, such as fruits, vegetables, and grains. Do not take Tylenol if you are taking prescription pain medications. ° ° ° ° °Follow up °Your surgeon may want to see you in the office following your access surgery. If so, this will be arranged at the time of your surgery. ° °Please call us immediately for any of the following conditions: ° °Increased pain, redness, drainage (pus) from your incision site °Fever of 101 degrees or higher °Severe or worsening pain at your incision site °Hand pain or numbness. ° °Reduce your risk of vascular disease: ° °Stop smoking. If you would like help, call [facility] at 1-800-QUIT-NOW (1-800-784-8669) or [place] at 336-586-4000 ° °Manage your cholesterol °Maintain a desired weight °Control your diabetes °Keep your blood pressure down ° °Dialysis ° °It will take several weeks to several months for your new dialysis access to be ready for use. Your surgeon will determine when it is OK to use it. Your nephrologist will continue to direct your dialysis. You can continue to use your Permcath until your new access is ready for use. ° °If you have any questions, please call the office at [phone]. ° °

## 2019-11-11 NOTE — Interval H&P Note (Signed)
History and Physical Interval Note:  11/11/2019 7:31 AM  Kelly Key  has presented today for surgery, with the diagnosis of END STAGE RENAL DISEASE FOR HEMODIALYSIS ACCESS.  The various methods of treatment have been discussed with the patient and family. After consideration of risks, benefits and other options for treatment, the patient has consented to  Procedure(s): ARTERIOVENOUS (AV) FISTULA CREATION LEFT ARM (Left) as a surgical intervention.  The patient's history has been reviewed, patient examined, no change in status, stable for surgery.  I have reviewed the patient's chart and labs.  Questions were answered to the patient's satisfaction.     [name]

## 2019-11-24 ENCOUNTER — Other Ambulatory Visit: Payer: Self-pay | Admitting: Physical Medicine and Rehabilitation

## 2019-12-05 ENCOUNTER — Other Ambulatory Visit: Payer: Self-pay

## 2019-12-05 MED ORDER — ATORVASTATIN CALCIUM 80 MG PO TABS: 80.0000 mg | ORAL_TABLET | Freq: Every day | ORAL | 3 refills | Status: DC

## 2019-12-05 NOTE — Telephone Encounter (Signed)
Refilled lipitor 

## 2019-12-07 ENCOUNTER — Ambulatory Visit: Payer: Medicare Other | Admitting: "Endocrinology

## 2019-12-21 ENCOUNTER — Ambulatory Visit: Payer: Medicare Other | Admitting: "Endocrinology

## 2019-12-22 ENCOUNTER — Encounter: Payer: Self-pay | Admitting: "Endocrinology

## 2019-12-25 NOTE — Progress Notes (Signed)
POST OPERATIVE OFFICE NOTE    CC:  F/u for surgery  HPI:  This is a 49 y.o. male who is s/p left BC AVF on 11/11/2019 by Dr. Trula Slade.  The pt does not have evidence of steal sx.  He states he has some numbness sometimes at night when he sleeps on his arm.   The pt is on dialysis T/T/S in [facility] via right tunneled dialysis catheter that was placed by IR on 07/08/2019.  Allergies  Allergen Reactions  . Lisinopril Swelling  . Hydralazine Anxiety    Hallucinations     Current Outpatient Medications  Medication Sig Dispense Refill  . acetaminophen (TYLENOL) 325 MG tablet Take 1-2 tablets (325-650 mg total) by mouth every 4 (four) hours as needed for mild pain. (Patient not taking: Reported on 11/08/2019)    . acetaminophen (TYLENOL) 500 MG tablet Take 1,000 mg by mouth every 6 (six) hours as needed for moderate pain or headache.    . albuterol (VENTOLIN HFA) 108 (90 Base) MCG/ACT inhaler Inhale 2 puffs into the lungs every 4 (four) hours as needed for wheezing or shortness of breath.     . allopurinol (ZYLOPRIM) 100 MG tablet Take 2 tablets (200 mg total) by mouth daily. 30 tablet 0  . amLODipine (NORVASC) 5 MG tablet Take 1 tablet (5 mg total) by mouth daily. 30 tablet 0  . aspirin EC 81 MG EC tablet Take 1 tablet (81 mg total) by mouth daily. 30 tablet 0  . atorvastatin (LIPITOR) 80 MG tablet Take 1 tablet (80 mg total) by mouth daily. 90 tablet 3  . calcium carbonate (TUMS - DOSED IN MG ELEMENTAL CALCIUM) 500 MG chewable tablet Chew 1 tablet by mouth daily as needed for indigestion or heartburn.    . Calcium Carbonate Antacid (CALCIUM CARBONATE, DOSED IN MG ELEMENTAL CALCIUM,) 1250 MG/5ML SUSP Take 5 mLs (500 mg of elemental calcium total) by mouth every 6 (six) hours as needed for indigestion. (Patient not taking: Reported on 11/08/2019) 450 mL   . carvedilol (COREG) 25 MG tablet Take 1 tablet (25 mg total) by mouth 2 (two) times daily with a meal. 60 tablet 0  . cloNIDine (CATAPRES) 0.3 MG  tablet Take 1 tablet (0.3 mg total) by mouth 3 (three) times daily. 90 tablet 0  . diclofenac sodium (VOLTAREN) 1 % GEL Apply 2 g topically 4 (four) times daily. (Patient taking differently: Apply 2 g topically 4 (four) times daily as needed (pain). )    . ezetimibe (ZETIA) 10 MG tablet Take 10 mg by mouth every evening.    . fluticasone (FLONASE) 50 MCG/ACT nasal spray Place 1 spray into both nostrils daily. (Patient taking differently: Place 1 spray into both nostrils daily as needed for allergies. ) 1 g 0  . furosemide (LASIX) 80 MG tablet Take 80 mg by mouth daily.    [name] gabapentin (NEURONTIN) 100 MG capsule Take 1 capsule (100 mg total) by mouth 3 (three) times daily. 90 capsule 0  . HYDROcodone-acetaminophen (NORCO/VICODIN) 5-325 MG tablet Take 1 tablet by mouth every 6 (six) hours as needed for severe pain. 10 tablet 0  . insulin aspart (NOVOLOG) 100 UNIT/ML injection Inject 5-15 Units into the skin 3 (three) times daily before meals.     . insulin detemir (LEVEMIR) 100 UNIT/ML injection Inject 0.38 mLs (38 Units total) into the skin 2 (two) times daily. 10 mL 1  . Insulin Pen Needle 31G X 5 MM MISC Use as directed. 100 each 0  .  linaclotide (LINZESS) 145 MCG CAPS capsule Take 1 capsule (145 mcg total) by mouth daily before breakfast. 30 capsule 0  . multivitamin (RENA-VIT) TABS tablet Take 1 tablet by mouth at bedtime. 30 tablet 0  . sevelamer carbonate (RENVELA) 800 MG tablet Take 3 tablets (2,400 mg total) by mouth 3 (three) times daily with meals. 270 tablet 0   No current facility-administered medications for this visit.     ROS:  See HPI  Physical Exam:  Today's Vitals   12/26/19 1152  BP: 132/77  Pulse: 76  Resp: 20  Temp: (!) 97.3 F (36.3 C)  TempSrc: Temporal  SpO2: 98%  Weight: (!) 314 lb (142.4 kg)  Height: 5\' 7"  (1.702 m)  PainSc: 5    Body mass index is 49.18 kg/m.   Incision:  Well healed Extremities:  There is a faintly palpable left radial pulse.  Motor  and sensory are in tact.  Grips are equal bilaterally.  There is a thrill/bruit present.  The catheter is in good position with felt cuff ~ 0.5cm from exit site.   Dialysis Duplex on 12/26/2019: Diameter:  0.79-0.86cm Depth:  0.30-1.17cm   Assessment/Plan:  This is a 49 y.o. male who is s/p:  left BC AVF on 11/11/2019 by Dr. Trula Slade.   -the pt does not have evidence of steal. -there is a palpable thrill throughout the fistula.  Dr. Trula Slade looked at fistula with u/s and feels this can be used starting 02/09/2020.  If unable to access, he will need superficialization.  This was discussed with the pt.  -If pt has tunneled dialysis catheter and the access has been used successfully to the satisfaction of the dialysis center, the tunneled catheter can be removed at their discretion.   -the pt will follow up as needed   Leontine Locket, PA-C [facility] [phone]  Clinic MD:  Pt seen and examined with Dr. Trula Slade

## 2019-12-26 ENCOUNTER — Ambulatory Visit (HOSPITAL_COMMUNITY)
Admission: RE | Admit: 2019-12-26 | Discharge: 2019-12-26 | Disposition: A | Discharge status: Home / Self Care | Payer: Medicare Other | Source: Ambulatory Visit | Attending: Vascular Surgery | Admitting: Vascular Surgery

## 2019-12-26 ENCOUNTER — Other Ambulatory Visit: Payer: Self-pay

## 2019-12-26 ENCOUNTER — Ambulatory Visit (INDEPENDENT_AMBULATORY_CARE_PROVIDER_SITE_OTHER): Payer: Self-pay | Admitting: Physician Assistant

## 2019-12-26 VITALS — BP 132/77 | HR 76 | Temp 97.3°F | Resp 20 | Ht 67.0 in | Wt 314.0 lb

## 2019-12-26 DIAGNOSIS — N186 End stage renal disease: Secondary | ICD-10-CM

## 2019-12-26 DIAGNOSIS — Z992 Dependence on renal dialysis: Secondary | ICD-10-CM | POA: Insufficient documentation

## 2020-01-12 LAB — ALDOSTERONE + RENIN ACTIVITY W/ RATIO
ALDO / PRA Ratio: >95.8 — ABNORMAL HIGH (ref 0.0–30.0)
Aldosterone: 16 ng/dL (ref 0.0–30.0)
PRA LC/MS/MS: <0.167 ng/mL/hr — ABNORMAL LOW (ref 0.167–5.380)

## 2020-03-19 ENCOUNTER — Other Ambulatory Visit (HOSPITAL_COMMUNITY): Payer: Self-pay | Admitting: Medical

## 2020-03-19 DIAGNOSIS — N186 End stage renal disease: Secondary | ICD-10-CM

## 2020-03-23 ENCOUNTER — Ambulatory Visit (HOSPITAL_COMMUNITY)
Admission: RE | Admit: 2020-03-23 | Discharge: 2020-03-23 | Disposition: A | Discharge status: Home / Self Care | Payer: Medicare Other | Source: Ambulatory Visit | Attending: Medical | Admitting: Medical

## 2020-03-23 ENCOUNTER — Other Ambulatory Visit: Payer: Self-pay

## 2020-03-23 DIAGNOSIS — N186 End stage renal disease: Secondary | ICD-10-CM | POA: Diagnosis not present

## 2020-03-23 DIAGNOSIS — Z4901 Encounter for fitting and adjustment of extracorporeal dialysis catheter: Secondary | ICD-10-CM | POA: Diagnosis not present

## 2020-03-23 HISTORY — PX: IR REMOVAL TUN CV CATH W/O FL: IMG2289

## 2020-03-23 MED ORDER — CHLORHEXIDINE GLUCONATE 4 % EX LIQD
CUTANEOUS | Status: AC
  Filled 2020-03-23: qty 15

## 2020-03-23 MED ORDER — LIDOCAINE HCL 1 % IJ SOLN
INTRAMUSCULAR | Status: AC
  Filled 2020-03-23: qty 20

## 2020-03-23 MED ORDER — LIDOCAINE HCL 1 % IJ SOLN
INTRAMUSCULAR | Status: DC | PRN
  Administered 2020-03-23: 10 mL

## 2020-03-23 NOTE — Procedures (Signed)
Pre procedural Dx: ESRD Post procedural Dx: Same  Successful removal of tunneled HD catheter. Catheter removed intact   EBL: None No immediate complications.  Please see imaging section of Epic for full dictation.  Avel Peace NP 03/23/2020 1:39 PM

## 2020-04-11 ENCOUNTER — Encounter: Payer: Self-pay | Admitting: Family Medicine

## 2020-04-11 ENCOUNTER — Ambulatory Visit (INDEPENDENT_AMBULATORY_CARE_PROVIDER_SITE_OTHER): Payer: Medicare Other | Admitting: Family Medicine

## 2020-04-11 VITALS — BP 160/100 | HR 76 | Ht 67.0 in | Wt 316.8 lb

## 2020-04-11 DIAGNOSIS — N186 End stage renal disease: Secondary | ICD-10-CM | POA: Diagnosis not present

## 2020-04-11 DIAGNOSIS — R0681 Apnea, not elsewhere classified: Secondary | ICD-10-CM

## 2020-04-11 DIAGNOSIS — G4710 Hypersomnia, unspecified: Secondary | ICD-10-CM

## 2020-04-11 DIAGNOSIS — I10 Essential (primary) hypertension: Secondary | ICD-10-CM

## 2020-04-11 DIAGNOSIS — R0683 Snoring: Secondary | ICD-10-CM

## 2020-04-11 DIAGNOSIS — I5032 Chronic diastolic (congestive) heart failure: Secondary | ICD-10-CM | POA: Diagnosis not present

## 2020-04-11 DIAGNOSIS — Z9189 Other specified personal risk factors, not elsewhere classified: Secondary | ICD-10-CM

## 2020-04-11 DIAGNOSIS — Z992 Dependence on renal dialysis: Secondary | ICD-10-CM

## 2020-04-11 NOTE — Patient Instructions (Addendum)
Medication Instructions:    Your physician recommends that you continue on your current medications as directed. Please refer to the Current Medication list given to you today.  Labwork:  NONE  Testing/Procedures:  NONE  Follow-Up:  Your physician recommends that you schedule a follow-up appointment in: 6 months (office)  Any Other Special Instructions Will Be Listed Below (If Applicable).  You have been referred to [facility] Pulmonology  If you need a refill on your cardiac medications before your next appointment, please call your pharmacy.

## 2020-04-11 NOTE — Progress Notes (Signed)
Cardiology Office Note  Date: 04/11/2020   ID: Kelly Key, DOB [birth year], MRN [id number]  PCP:  [name], MD  Cardiologist:  Carlyle Dolly, MD Electrophysiologist:  None   Chief Complaint: Follow-up chronic diastolic heart failure, ESRD, hypertension  History of Present Illness: Kelly Key is a 49 y.o. male with a history of chronic diastolic heart failure, ESRD, hypertension  Echo 03/2018 LVEF 60 to 65%, no WMA's, indeterminate diastolic function. Echo 06/16/2019 LVEF 50 to 55%, indeterminate diastolic function.  Last encounter 08/29/2019 with Dr. Harl Bowie via telemedicine visit.  Had no recent SOB or DOE.  Fluid management per HD.  Was still making some urine.  Dry weight was 286.  Admission on 07/17/2019 prolonged rehab stay after discharge.  Admitted with anasarca and started on dialysis.  Dialysis on Tuesday, Thursday, Saturday, no recent lower extremity edema.  Blood pressures on non-HD days 130s over 90s.  Notes current symptoms with chronic diastolic heart failure.  HD per nephrology.  Reasonable control given issues with labile blood pressures on HD days  Patient here for 77-month follow-up.  Continues with dialysis on Tuesdays Thursdays and Saturdays.  Continues with labile blood pressures.  States he takes his blood pressure medicines prior to dialysis on dialysis days because if he does not his blood pressure is significantly elevated.  States he is having issues with ED and may see a doctor in the future to explore medication to help.  States he has tried Viagra and Cialis in the past which do not appear to work anymore.  States he believes it is associated with his blood pressure medication.  He complains of issues with stopping breathing sometimes at home.  Also complains of significant snoring and excessive daytime sleepiness.  He denies any significant anginal or exertional symptoms, palpitations or arrhythmias, orthostatic symptoms other than when then during dialysis.   States he is trying to lose weight.  States he has been placed on the kidney list for possible kidney transplant in the future.  Past Medical History:  Diagnosis Date  . Acute diastolic CHF (congestive heart failure) ([place]) 06/11/2012   EF 50-55% [facility])  . Chronic kidney disease   . COPD (chronic obstructive pulmonary disease) ([place])   . Diabetes mellitus    A1c 11.5 (06/11/2012).  [name] Dyspnea   . Gout   . Hepatic steatosis 06/11/2012   Elevated LFTs  . Hyperlipemia   . Malignant hypertension   . Microcytic anemia 06/12/2012  . Obesity     Past Surgical History:  Procedure Laterality Date  . AV FISTULA PLACEMENT Left 11/11/2019   Procedure: ARTERIOVENOUS (AV) FISTULA CREATION LEFT BRACHIOCEPHALIC ARM;  Surgeon: Serafina Mitchell, MD;  Location: [facility];  Service: Vascular;  Laterality: Left;  . IR FLUORO GUIDE CV LINE RIGHT  07/08/2019  . IR REMOVAL TUN CV CATH W/O FL  03/23/2020  . IR US GUIDE VASC ACCESS RIGHT  07/08/2019  . NO PAST SURGERIES    . VASCULAR SURGERY      Current Outpatient Medications  Medication Sig Dispense Refill  . acetaminophen (TYLENOL) 325 MG tablet Take 1-2 tablets (325-650 mg total) by mouth every 4 (four) hours as needed for mild pain.    [name] acetaminophen (TYLENOL) 500 MG tablet Take 1,000 mg by mouth every 6 (six) hours as needed for moderate pain or headache.    . albuterol (VENTOLIN HFA) 108 (90 Base) MCG/ACT inhaler Inhale 2 puffs into the lungs every 4 (four) hours as  needed for wheezing or shortness of breath.     . allopurinol (ZYLOPRIM) 100 MG tablet Take 2 tablets (200 mg total) by mouth daily. 30 tablet 0  . amLODipine (NORVASC) 5 MG tablet Take 1 tablet (5 mg total) by mouth daily. 30 tablet 0  . aspirin EC 81 MG EC tablet Take 1 tablet (81 mg total) by mouth daily. 30 tablet 0  . atorvastatin (LIPITOR) 80 MG tablet Take 1 tablet (80 mg total) by mouth daily. 90 tablet 3  . calcium carbonate (TUMS - DOSED IN MG ELEMENTAL CALCIUM) 500 MG  chewable tablet Chew 1 tablet by mouth daily as needed for indigestion or heartburn.    . Calcium Carbonate Antacid (CALCIUM CARBONATE, DOSED IN MG ELEMENTAL CALCIUM,) 1250 MG/5ML SUSP Take 5 mLs (500 mg of elemental calcium total) by mouth every 6 (six) hours as needed for indigestion. 450 mL   . carvedilol (COREG) 25 MG tablet Take 1 tablet (25 mg total) by mouth 2 (two) times daily with a meal. 60 tablet 0  . cloNIDine (CATAPRES) 0.3 MG tablet Take 1 tablet (0.3 mg total) by mouth 3 (three) times daily. 90 tablet 0  . diclofenac sodium (VOLTAREN) 1 % GEL Apply 2 g topically 4 (four) times daily. (Patient taking differently: Apply 2 g topically 4 (four) times daily as needed (pain). )    . ezetimibe (ZETIA) 10 MG tablet Take 10 mg by mouth every evening.    . fluticasone (FLONASE) 50 MCG/ACT nasal spray Place 1 spray into both nostrils daily. (Patient taking differently: Place 1 spray into both nostrils daily as needed for allergies. ) 1 g 0  . furosemide (LASIX) 80 MG tablet Take 80 mg by mouth daily.    [name] gabapentin (NEURONTIN) 100 MG capsule Take 1 capsule (100 mg total) by mouth 3 (three) times daily. 90 capsule 0  . HYDROcodone-acetaminophen (NORCO/VICODIN) 5-325 MG tablet Take 1 tablet by mouth every 6 (six) hours as needed for severe pain. 10 tablet 0  . insulin aspart (NOVOLOG) 100 UNIT/ML injection Inject 5-15 Units into the skin 3 (three) times daily before meals.     . insulin detemir (LEVEMIR) 100 UNIT/ML injection Inject 0.38 mLs (38 Units total) into the skin 2 (two) times daily. 10 mL 1  . Insulin Pen Needle 31G X 5 MM MISC Use as directed. 100 each 0  . linaclotide (LINZESS) 145 MCG CAPS capsule Take 1 capsule (145 mcg total) by mouth daily before breakfast. 30 capsule 0  . multivitamin (RENA-VIT) TABS tablet Take 1 tablet by mouth at bedtime. 30 tablet 0  . sevelamer carbonate (RENVELA) 800 MG tablet Take 3 tablets (2,400 mg total) by mouth 3 (three) times daily with meals. 270  tablet 0   No current facility-administered medications for this visit.   Allergies:  Lisinopril and Hydralazine   Social History: The patient  reports that he has never smoked. He has never used smokeless tobacco. He reports previous alcohol use. He reports that he does not use drugs.   Family History: The patient's family history includes Asthma in his mother; Diabetes in his father, sister, and sister; Gout in his mother; Heart failure in his father; Hypertension in his brother; Pancreatic cancer in his brother.   ROS:  Please see the history of present illness. Otherwise, complete review of systems is positive for none.  All other systems are reviewed and negative.   Physical Exam: VS:  BP (!) 160/100   Pulse 76  Ht 5\' 7"  (1.702 m)   Wt (!) 316 lb 12.8 oz (143.7 kg)   SpO2 98%   BMI 49.62 kg/m , BMI Body mass index is 49.62 kg/m.  Wt Readings from Last 3 Encounters:  04/11/20 (!) 316 lb 12.8 oz (143.7 kg)  12/26/19 (!) 314 lb (142.4 kg)  11/11/19 300 lb (136.1 kg)    General: Patient appears comfortable at rest. Neck: Supple, no elevated JVP or carotid bruits, no thyromegaly. Lungs: Clear to auscultation, nonlabored breathing at rest. Cardiac: Regular rate and rhythm, no S3 or significant systolic murmur, no pericardial rub. Extremities: Mild non-pitting edema, distal pulses 2+. Skin: Warm and dry. Musculoskeletal: No kyphosis. Neuropsychiatric: Alert and oriented x3, affect grossly appropriate.  ECG:  EKG on 07/03/2019 showed sinus tachycardia with a rate of 106 left axis deviation, consider left anterior fascicular block.  Probable anterior septal infarct, old  Recent Labwork: 06/22/2019: ALT 21; AST 21; B Natriuretic Peptide 119.0 07/05/2019: Magnesium 2.1 08/02/2019: Platelets 355 11/11/2019: BUN 30; Creatinine, Ser 4.90; Hemoglobin 11.2; Potassium 3.7; Sodium 138  No results found for: CHOL, TRIG, HDL, CHOLHDL, VLDL, LDLCALC, LDLDIRECT  Other Studies Reviewed  Today:  Echocardiogram 06/23/2019 1. The left ventricle has low normal systolic function, with an ejection  fraction of 50-55%. The cavity size was normal. There is mildly increased  left ventricular wall thickness. Left ventricular diastolic Doppler  parameters are indeterminate.  2. The right ventricle has normal systolic function. The cavity was  normal. There is no increase in right ventricular wall thickness.  3. The aortic valve is tricuspid. Mild thickening of the aortic valve.  4. There is dilatation of the aortic root measuring 40 mm.   Assessment and Plan:  1. Chronic diastolic heart failure ([place])   2. ESRD (end stage renal disease) on dialysis ([place])   3. Essential hypertension   4. Snoring   5. At risk for obstructive sleep apnea   6. Hypersomnolence   7. Apnea    1. Chronic diastolic heart failure ([place]) Last echocardiogram June 23, 2019 showed EF of 50 to 55% mildly increased left ventricular wall thickness.  Left ventricular diastolic parameters indeterminate.  Continue furosemide 80 mg daily, Coreg 25 mg p.o. twice daily  2. ESRD (end stage renal disease) on dialysis [facility]) Continues on dialysis Tuesday Thursdays and Saturdays.  He has a functioning AV fistula on his left upper extremity with positive thrill and bruit.  States he continues to take his blood pressure medications prior to dialysis due to labile blood pressures.  3. Essential hypertension Blood pressure elevated today at 160/100.  States his blood pressure is usually better during dialysis but sometimes significantly drops unless he elevates his legs.  Continue amlodipine 5 mg daily, clonidine 0.3 mg 3 times daily, Coreg 25 mg p.o. twice daily.  4. 5. 6.  Complains of snoring, periods of apnea, excessive daytime sleepiness, at risk for sleep apnea.  Refer to [facility] pulmonology clinic to evaluate for sleep apnea.   Medication Adjustments/Labs and Tests Ordered: Current medicines are reviewed at  length with the patient today.  Concerns regarding medicines are outlined above.   Disposition: Follow-up with Dr. Harl Bowie or APP 6 months.  Signed, Levell July, NP 04/11/2020 2:52 PM    [facility] at [place], [place], [place] [address] Phone: [phone]; Fax: [phone]

## 2020-05-30 ENCOUNTER — Ambulatory Visit: Payer: Medicare Other | Admitting: Urology

## 2020-06-01 ENCOUNTER — Institutional Professional Consult (permissible substitution): Payer: Medicare Other | Admitting: Pulmonary Disease

## 2020-07-24 LAB — LIPID PANEL
Cholesterol: 115 (ref 0–200)
HDL: 34 — AB (ref 35–70)
LDL Cholesterol: 47
Triglycerides: 168 — AB (ref 40–160)

## 2020-07-24 LAB — HEMOGLOBIN A1C: Hemoglobin A1C: 10.2

## 2020-07-24 LAB — BASIC METABOLIC PANEL: Creatinine: 7.9 — AB (ref 0.6–1.3)

## 2020-08-02 ENCOUNTER — Telehealth: Payer: Self-pay

## 2020-08-02 LAB — BASIC METABOLIC PANEL: BUN: 60 — AB (ref 4–21)

## 2020-08-02 NOTE — Telephone Encounter (Signed)
Ok. Let us give him a chance.

## 2020-08-02 NOTE — Telephone Encounter (Signed)
Kenyon Ana, PA left a VM that he would like to get this patient rescheduled with you. He had missed some new pt appointments last year and you asked me to not r/s. Do you want to try to get him back in? The PA said he is a much better patient now and would really like him to see you.   Kelly Key [phone]

## 2020-08-13 ENCOUNTER — Ambulatory Visit (INDEPENDENT_AMBULATORY_CARE_PROVIDER_SITE_OTHER): Payer: Medicare Other | Admitting: "Endocrinology

## 2020-08-13 ENCOUNTER — Encounter: Payer: Self-pay | Admitting: "Endocrinology

## 2020-08-13 ENCOUNTER — Other Ambulatory Visit: Payer: Self-pay

## 2020-08-13 VITALS — BP 142/80 | HR 80 | Ht 67.0 in | Wt 326.8 lb

## 2020-08-13 DIAGNOSIS — I10 Essential (primary) hypertension: Secondary | ICD-10-CM

## 2020-08-13 DIAGNOSIS — E782 Mixed hyperlipidemia: Secondary | ICD-10-CM | POA: Insufficient documentation

## 2020-08-13 DIAGNOSIS — N185 Chronic kidney disease, stage 5: Secondary | ICD-10-CM

## 2020-08-13 DIAGNOSIS — E1122 Type 2 diabetes mellitus with diabetic chronic kidney disease: Secondary | ICD-10-CM | POA: Diagnosis not present

## 2020-08-13 DIAGNOSIS — E1165 Type 2 diabetes mellitus with hyperglycemia: Secondary | ICD-10-CM | POA: Diagnosis not present

## 2020-08-13 DIAGNOSIS — IMO0002 Reserved for concepts with insufficient information to code with codable children: Secondary | ICD-10-CM

## 2020-08-13 MED ORDER — BD PEN NEEDLE SHORT U/F 31G X 8 MM MISC: 1.0000 | 3 refills | Status: AC

## 2020-08-13 MED ORDER — LEVEMIR FLEXTOUCH 100 UNIT/ML ~~LOC~~ SOPN: 40.0000 [IU] | PEN_INJECTOR | Freq: Every day | SUBCUTANEOUS | 2 refills | Status: DC

## 2020-08-13 MED ORDER — NOVOLOG FLEXPEN RELION 100 UNIT/ML ~~LOC~~ SOPN: 8.0000 [IU] | PEN_INJECTOR | Freq: Three times a day (TID) | SUBCUTANEOUS | 2 refills | Status: DC

## 2020-08-13 NOTE — Progress Notes (Signed)
Endocrinology Consult Note       08/13/2020, 5:15 PM   Subjective:    Patient ID: Kelly Key, male    DOB: Dec 15, 1970.  Kelly Key is being seen in consultation for management of currently uncontrolled symptomatic diabetes requested by  [name], MD.   Past Medical History:  Diagnosis Date  . Acute diastolic CHF (congestive heart failure) ([place]) 06/11/2012   EF 50-55% [facility])  . Chronic kidney disease   . COPD (chronic obstructive pulmonary disease) ([place])   . Diabetes mellitus    A1c 11.5 (06/11/2012).  [name] Dyspnea   . Gout   . Hepatic steatosis 06/11/2012   Elevated LFTs  . Hyperlipemia   . Malignant hypertension   . Microcytic anemia 06/12/2012  . Obesity     Past Surgical History:  Procedure Laterality Date  . AV FISTULA PLACEMENT Left 11/11/2019   Procedure: ARTERIOVENOUS (AV) FISTULA CREATION LEFT BRACHIOCEPHALIC ARM;  Surgeon: Serafina Mitchell, MD;  Location: [facility];  Service: Vascular;  Laterality: Left;  . IR FLUORO GUIDE CV LINE RIGHT  07/08/2019  . IR REMOVAL TUN CV CATH W/O FL  03/23/2020  . IR US GUIDE VASC ACCESS RIGHT  07/08/2019  . NO PAST SURGERIES    . VASCULAR SURGERY      Social History   Socioeconomic History  . Marital status: Single    Spouse name: Not on file  . Number of children: Not on file  . Years of education: Not on file  . Highest education level: Not on file  Occupational History  . Occupation: retired  Tobacco Use  . Smoking status: Never Smoker  . Smokeless tobacco: Never Used  Vaping Use  . Vaping Use: Never used  Substance and Sexual Activity  . Alcohol use: Not Currently    Comment: haven't drank in over 5 years   . Drug use: No  . Sexual activity: Not Currently  Other Topics Concern  . Not on file  Social History Narrative  . Not on file   Social Determinants of Health   Financial Resource Strain:   . Difficulty of Paying  Living Expenses: Not on file  Food Insecurity:   . Worried About Charity fundraiser in the Last Year: Not on file  . Ran Out of Food in the Last Year: Not on file  Transportation Needs:   . Lack of Transportation (Medical): Not on file  . Lack of Transportation (Non-Medical): Not on file  Physical Activity:   . Days of Exercise per Week: Not on file  . Minutes of Exercise per Session: Not on file  Stress:   . Feeling of Stress : Not on file  Social Connections:   . Frequency of Communication with Friends and Family: Not on file  . Frequency of Social Gatherings with Friends and Family: Not on file  . Attends Religious Services: Not on file  . Active Member of Clubs or Organizations: Not on file  . Attends Archivist Meetings: Not on file  . Marital Status: Not on file    Family History  Problem Relation Age of  Onset  . Gout Mother   . Asthma Mother   . Diabetes Father   . Heart failure Father   . Diabetes Sister   . Hypertension Brother   . Pancreatic cancer Brother   . Diabetes Sister     Outpatient Encounter Medications as of 08/13/2020  Medication Sig  . acetaminophen (TYLENOL) 325 MG tablet Take 1-2 tablets (325-650 mg total) by mouth every 4 (four) hours as needed for mild pain.  [name] acetaminophen (TYLENOL) 500 MG tablet Take 1,000 mg by mouth every 6 (six) hours as needed for moderate pain or headache.  . albuterol (VENTOLIN HFA) 108 (90 Base) MCG/ACT inhaler Inhale 2 puffs into the lungs every 4 (four) hours as needed for wheezing or shortness of breath.   . allopurinol (ZYLOPRIM) 100 MG tablet Take 2 tablets (200 mg total) by mouth daily.  [name] amLODipine (NORVASC) 5 MG tablet Take 1 tablet (5 mg total) by mouth daily.  [name] aspirin EC 81 MG EC tablet Take 1 tablet (81 mg total) by mouth daily.  [name] atorvastatin (LIPITOR) 80 MG tablet Take 1 tablet (80 mg total) by mouth daily.  . calcium carbonate (TUMS - DOSED IN MG ELEMENTAL CALCIUM) 500 MG chewable tablet Chew 1  tablet by mouth daily as needed for indigestion or heartburn.  . Calcium Carbonate Antacid (CALCIUM CARBONATE, DOSED IN MG ELEMENTAL CALCIUM,) 1250 MG/5ML SUSP Take 5 mLs (500 mg of elemental calcium total) by mouth every 6 (six) hours as needed for indigestion.  . carvedilol (COREG) 25 MG tablet Take 1 tablet (25 mg total) by mouth 2 (two) times daily with a meal.  . cloNIDine (CATAPRES) 0.3 MG tablet Take 1 tablet (0.3 mg total) by mouth 3 (three) times daily.  . diclofenac sodium (VOLTAREN) 1 % GEL Apply 2 g topically 4 (four) times daily. (Patient taking differently: Apply 2 g topically 4 (four) times daily as needed (pain). )  . ezetimibe (ZETIA) 10 MG tablet Take 10 mg by mouth every evening.  . fluticasone (FLONASE) 50 MCG/ACT nasal spray Place 1 spray into both nostrils daily. (Patient taking differently: Place 1 spray into both nostrils daily as needed for allergies. )  . furosemide (LASIX) 80 MG tablet Take 80 mg by mouth daily.  [name] gabapentin (NEURONTIN) 100 MG capsule Take 1 capsule (100 mg total) by mouth 3 (three) times daily.  [name] HYDROcodone-acetaminophen (NORCO/VICODIN) 5-325 MG tablet Take 1 tablet by mouth every 6 (six) hours as needed for severe pain.  [name] insulin aspart (NOVOLOG FLEXPEN RELION) 100 UNIT/ML FlexPen Inject 8-14 Units into the skin 3 (three) times daily with meals.  . insulin detemir (LEVEMIR FLEXTOUCH) 100 UNIT/ML FlexPen Inject 40 Units into the skin daily.  . Insulin Pen Needle (B-D ULTRAFINE III SHORT PEN) 31G X 8 MM MISC 1 each by Does not apply route as directed.  . linaclotide (LINZESS) 145 MCG CAPS capsule Take 1 capsule (145 mcg total) by mouth daily before breakfast.  . multivitamin (RENA-VIT) TABS tablet Take 1 tablet by mouth at bedtime.  . sevelamer carbonate (RENVELA) 800 MG tablet Take 3 tablets (2,400 mg total) by mouth 3 (three) times daily with meals.  . [DISCONTINUED] insulin aspart (NOVOLOG) 100 UNIT/ML injection Inject 5-15 Units into the skin 3  (three) times daily before meals.   . [DISCONTINUED] insulin detemir (LEVEMIR) 100 UNIT/ML injection Inject 0.38 mLs (38 Units total) into the skin 2 (two) times daily. (Patient taking differently: Inject 45 Units into the skin at bedtime. )  . [  DISCONTINUED] Insulin Pen Needle 31G X 5 MM MISC Use as directed.   No facility-administered encounter medications on file as of 08/13/2020.    ALLERGIES: Allergies  Allergen Reactions  . Lisinopril Swelling  . Hydralazine Anxiety    Hallucinations     VACCINATION STATUS:  There is no immunization history on file for this patient.  Diabetes He presents for his initial diabetic visit. He has type 2 diabetes mellitus. Onset time: He was diagnosed at approximate age of 36 years. His disease course has been worsening. There are no hypoglycemic associated symptoms. Pertinent negatives for hypoglycemia include no confusion, headaches, pallor or seizures. Associated symptoms include blurred vision, foot paresthesias and polydipsia. Pertinent negatives for diabetes include no chest pain, no fatigue, no polyphagia, no polyuria and no weakness. There are no hypoglycemic complications. Symptoms are worsening. Diabetic complications include nephropathy. Risk factors for coronary artery disease include dyslipidemia, diabetes mellitus, family history, male sex, obesity, hypertension and sedentary lifestyle. Compliance with diabetes treatment: He is currently on Levemir 38 units twice daily, NovoLog 5-15 units 3 times daily AC. His weight is fluctuating minimally. He is following a generally unhealthy diet. When asked about meal planning, he reported none. He has had a previous visit with a dietitian. His home blood glucose trend is fluctuating minimally. (He did not bring his logs nor meter to review.  His recent A1c was 10.2% in September 2021, improved from 12.4% in August 2020.  He denies hypoglycemia. ) An ACE inhibitor/angiotensin II receptor blocker is not being  taken. Eye exam is current.  Hyperlipidemia This is a chronic problem. The current episode started more than 1 year ago. The problem is uncontrolled. Exacerbating diseases include chronic renal disease, diabetes and obesity. Pertinent negatives include no chest pain, myalgias or shortness of breath. Current antihyperlipidemic treatment includes statins. Risk factors for coronary artery disease include dyslipidemia, diabetes mellitus, family history, hypertension, male sex, obesity and a sedentary lifestyle.  Hypertension This is a chronic problem. The current episode started more than 1 year ago. Associated symptoms include blurred vision. Pertinent negatives include no chest pain, headaches, neck pain, palpitations or shortness of breath. Risk factors for coronary artery disease include dyslipidemia, diabetes mellitus, male gender, obesity, sedentary lifestyle and family history. Past treatments include central alpha agonists and calcium channel blockers. Hypertensive end-organ damage includes kidney disease and heart failure. He has ESRD on dialysis.. Identifiable causes of hypertension include chronic renal disease.     Review of Systems  Constitutional: Negative for chills, fatigue, fever and unexpected weight change.  HENT: Negative for dental problem, mouth sores and trouble swallowing.   Eyes: Positive for blurred vision. Negative for visual disturbance.  Respiratory: Negative for cough, choking, chest tightness, shortness of breath and wheezing.   Cardiovascular: Negative for chest pain, palpitations and leg swelling.  Gastrointestinal: Negative for abdominal distention, abdominal pain, constipation, diarrhea, nausea and vomiting.  Endocrine: Positive for polydipsia. Negative for polyphagia and polyuria.  Genitourinary: Negative for dysuria, flank pain, hematuria and urgency.  Musculoskeletal: Negative for back pain, gait problem, myalgias and neck pain.  Skin: Negative for pallor, rash and  wound.  Neurological: Negative for seizures, syncope, weakness, numbness and headaches.  Psychiatric/Behavioral: Negative for confusion and dysphoric mood.    Objective:    Vitals with BMI 08/13/2020 04/11/2020 12/26/2019  Height 5\' 7"  5\' 7"  5\' 7"   Weight 326 lbs 13 oz 316 lbs 13 oz 314 lbs  BMI 51.17 16.01 09.32  Systolic 355 732 202  Diastolic 80  100 77  Pulse 80 76 76    BP (!) 142/80   Pulse 80   Ht 5\' 7"  (1.702 m)   Wt (!) 326 lb 12.8 oz (148.2 kg)   BMI 51.18 kg/m   Wt Readings from Last 3 Encounters:  08/13/20 (!) 326 lb 12.8 oz (148.2 kg)  04/11/20 (!) 316 lb 12.8 oz (143.7 kg)  12/26/19 (!) 314 lb (142.4 kg)     Physical Exam Constitutional:      General: He is not in acute distress.    Appearance: He is well-developed.  HENT:     Head: Normocephalic and atraumatic.  Neck:     Thyroid: No thyromegaly.     Trachea: No tracheal deviation.  Cardiovascular:     Rate and Rhythm: Normal rate.     Pulses:          Dorsalis pedis pulses are 1+ on the right side and 1+ on the left side.       Posterior tibial pulses are 1+ on the right side and 1+ on the left side.     Heart sounds: Normal heart sounds, S1 normal and S2 normal. No murmur heard.  No gallop.   Pulmonary:     Effort: Pulmonary effort is normal. No respiratory distress.     Breath sounds: No wheezing.  Abdominal:     General: There is no distension.     Tenderness: There is no abdominal tenderness. There is no guarding.  Musculoskeletal:     Right shoulder: No swelling or deformity.     Cervical back: Normal range of motion and neck supple.  Skin:    General: Skin is warm and dry.     Findings: No rash.     Nails: There is no clubbing.  Neurological:     Mental Status: He is alert and oriented to person, place, and time.     Cranial Nerves: No cranial nerve deficit.     Sensory: No sensory deficit.     Gait: Gait normal.     Deep Tendon Reflexes: Reflexes are normal and symmetric.  Psychiatric:         Speech: Speech normal.        Behavior: Behavior normal. Behavior is cooperative.        Thought Content: Thought content normal.        Judgment: Judgment normal.     Comments: Patient has reluctant affect.       CMP ( most recent) CMP     Component Value Date/Time   NA 138 11/11/2019 0636   K 3.7 11/11/2019 0636   CL 100 11/11/2019 0636   CO2 22 08/02/2019 1318   GLUCOSE 121 (H) 11/11/2019 0636   BUN 60 (A) 08/02/2020 0000   CREATININE 7.9 (A) 07/24/2020 0000   CREATININE 4.90 (H) 11/11/2019 0636   CALCIUM 8.4 (L) 08/02/2019 1318   PROT 6.9 06/22/2019 1052   ALBUMIN 2.4 (L) 08/02/2019 1318   AST 21 06/22/2019 1052   ALT 21 06/22/2019 1052   ALKPHOS 107 06/22/2019 1052   BILITOT 0.3 06/22/2019 1052   GFRNONAA 10 (L) 08/02/2019 1318   GFRAA 12 (L) 08/02/2019 1318     Diabetic Labs (most recent): Lab Results  Component Value Date   HGBA1C 10.2 07/24/2020   HGBA1C 12.1 (H) 06/23/2019   HGBA1C 12.4 (H) 06/22/2019     Lipid Panel ( most recent) Lipid Panel     Component Value Date/Time   CHOL 115 07/24/2020  0000   TRIG 168 (A) 07/24/2020 0000   HDL 34 (A) 07/24/2020 0000   LDLCALC 47 07/24/2020 0000      Lab Results  Component Value Date   TSH 2.827 06/11/2012   FREET4 1.35 06/11/2012           Assessment & Plan:   1. Uncontrolled type 2 diabetes mellitus with stage 5 chronic kidney disease ([place])  - Kelly Key has currently uncontrolled symptomatic type 2 DM since  49 years of age. Recent labs reviewed, patient with ESRD on hemodialysis. He did not bring his logs nor meter to review.  His recent A1c was 10.2% in September 2021, improved from 12.4% in August 2020.  He denies hypoglycemia.   - I had a long discussion with him about the progressive nature of diabetes and the pathology behind its complications. -his diabetes is complicated by ESRD on hemodialysis, CHF, obesity/sedentary life and he remains at a high risk for more acute and  chronic complications which include CAD, CVA, CKD, retinopathy, and neuropathy. These are all discussed in detail with him.  - I have counseled him on diet  and weight management  by adopting a carbohydrate restricted/protein rich diet. Patient is encouraged to switch to  unprocessed or minimally processed     complex starch and increased protein intake (animal or plant source), fruits, and vegetables. -  he is advised to stick to a routine mealtimes to eat 3 meals  a day and avoid unnecessary snacks ( to snack only to correct hypoglycemia).   - he admits that there is a room for improvement in his food and drink choices. - Suggestion is made for him to avoid simple carbohydrates  from his diet including Cakes, Sweet Desserts, Ice Cream, Soda (diet and regular), Sweet Tea, Candies, Chips, Cookies, Store Bought Juices, Alcohol in Excess of  1-2 drinks a day, Artificial Sweeteners,  Coffee Creamer, and "Sugar-free" Products. This will help patient to have more stable blood glucose profile and potentially avoid unintended weight gain.  - he will be scheduled with Jearld Fenton, RDN, CDE for diabetes education.  - I have approached him with the following individualized plan to manage  his diabetes and patient agrees:   -In light of his prevailing glycemic burden, he will continue to need Intensive treatment with basal/bolus insulin in order for him to achieve and maintain control of diabetes to target. -Accordingly, he is advised to lower his Levemir to 40 units nightly, readjust his NovoLog to 8  units 3 times a day with meals  for pre-meal BG readings of 90-150mg /dl, plus patient specific correction dose for unexpected hyperglycemia above 150mg /dl, associated with strict monitoring of glucose 4 times a day-before meals and at bedtime. - he is warned not to take insulin without proper monitoring per orders. - Adjustment parameters are given to him for hypo and hyperglycemia in writing. - he is  encouraged to call clinic for blood glucose levels less than 70 or above 300 mg /dl. -This patient will greatly benefit from a CGM.  He will be considered for the freestyle libre device during his next visit in a week. - he is not a suitable candidate for Metformin, SGLT2 inhibitors.   - he will be considered for incretin therapy as appropriate next visit.  - Specific targets for  A1c;  LDL, HDL,  and Triglycerides were discussed with the patient.  2) Blood Pressure /Hypertension:  his blood pressure is not controlled to target.  he is advised to continue his current medications including clonidine 0.3 mg p.o. twice daily, amlodipine 5 mg p.o. daily.    3) Lipids/Hyperlipidemia:   Review of his recent lipd panel showed  controlled  LDL at 47 .  he  is advised to continue    atorvastatin 80 mg daily at bedtime.  Side effects and precautions discussed with him.  4)  Weight/Diet:  Body mass index is 51.18 kg/m.  -   clearly complicating his diabetes care.   he is  a candidate for weight loss. I discussed with him the fact that loss of 5 - 10% of his  current body weight will have the most impact on his diabetes management.  Exercise, and detailed carbohydrates information provided  -  detailed on discharge instructions.  5) Chronic Care/Health Maintenance:  -he  is on  Statin medications and  is encouraged to initiate and continue to follow up with Ophthalmology, Dentist,  Podiatrist at least yearly or according to recommendations, and advised to   stay away from smoking. I have recommended yearly flu vaccine and pneumonia vaccine at least every 5 years; moderate intensity exercise for up to 150 minutes weekly; and  sleep for at least 7 hours a day.   He will have ABI study next visit in a week.  - he is  advised to maintain close follow up with [name], MD for primary care needs, as well as his other providers for optimal and coordinated care.   - Time spent in this patient care: 60 min,  of which > 50% was spent in  counseling  him about his chronically uncontrolled, complicated type 2 diabetes; hyperlipidemia; hypertension and the rest reviewing his blood glucose logs , discussing his hypoglycemia and hyperglycemia episodes, reviewing his current and  previous labs / studies  ( including abstraction from other facilities) and medications  doses and developing a  long term treatment plan based on the latest standards of care/ guidelines; and documenting his care.    Please refer to Patient Instructions for Blood Glucose Monitoring and Insulin/Medications Dosing Guide"  in media tab for additional information. Please  also refer to " Patient Self Inventory" in the Media  tab for reviewed elements of pertinent patient history.  Kelly Key participated in the discussions, expressed understanding, and voiced agreement with the above plans.  All questions were answered to his satisfaction. he is encouraged to contact clinic should he have any questions or concerns prior to his return visit.   Follow up plan: - Return in about 1 week (around 08/20/2020) for F/U with Meter and Logs Only - no Labs, ABI in Office [place].  Glade Lloyd, MD [facility] [address] [place], [place] [address] Phone: [phone]  Fax: [phone]    08/13/2020, 5:15 PM  This note was partially dictated with voice recognition software. Similar sounding words can be transcribed inadequately or may not  be corrected upon review.

## 2020-08-13 NOTE — Patient Instructions (Signed)

## 2020-08-14 ENCOUNTER — Telehealth: Payer: Self-pay

## 2020-08-14 DIAGNOSIS — E1122 Type 2 diabetes mellitus with diabetic chronic kidney disease: Secondary | ICD-10-CM

## 2020-08-14 DIAGNOSIS — IMO0002 Reserved for concepts with insufficient information to code with codable children: Secondary | ICD-10-CM

## 2020-08-14 MED ORDER — ACCU-CHEK AVIVA PLUS VI STRP: ORAL_STRIP | 3 refills | Status: DC

## 2020-08-14 NOTE — Telephone Encounter (Signed)
Sent in

## 2020-08-14 NOTE — Telephone Encounter (Signed)
Pt requesting test strips on accu check plus one. [facility] [place]

## 2020-08-15 ENCOUNTER — Other Ambulatory Visit: Payer: Self-pay

## 2020-08-15 DIAGNOSIS — E1122 Type 2 diabetes mellitus with diabetic chronic kidney disease: Secondary | ICD-10-CM

## 2020-08-15 DIAGNOSIS — IMO0002 Reserved for concepts with insufficient information to code with codable children: Secondary | ICD-10-CM

## 2020-08-15 MED ORDER — ACCU-CHEK AVIVA PLUS VI STRP: ORAL_STRIP | 3 refills | Status: AC

## 2020-08-22 ENCOUNTER — Other Ambulatory Visit: Payer: Self-pay

## 2020-08-22 ENCOUNTER — Encounter: Payer: Self-pay | Admitting: Nurse Practitioner

## 2020-08-22 ENCOUNTER — Ambulatory Visit (INDEPENDENT_AMBULATORY_CARE_PROVIDER_SITE_OTHER): Payer: Medicare Other | Admitting: Nurse Practitioner

## 2020-08-22 VITALS — BP 154/95 | HR 77 | Wt 321.0 lb

## 2020-08-22 DIAGNOSIS — E1122 Type 2 diabetes mellitus with diabetic chronic kidney disease: Secondary | ICD-10-CM | POA: Diagnosis not present

## 2020-08-22 DIAGNOSIS — I10 Essential (primary) hypertension: Secondary | ICD-10-CM | POA: Diagnosis not present

## 2020-08-22 DIAGNOSIS — E1165 Type 2 diabetes mellitus with hyperglycemia: Secondary | ICD-10-CM

## 2020-08-22 DIAGNOSIS — E782 Mixed hyperlipidemia: Secondary | ICD-10-CM

## 2020-08-22 DIAGNOSIS — N185 Chronic kidney disease, stage 5: Secondary | ICD-10-CM

## 2020-08-22 DIAGNOSIS — IMO0002 Reserved for concepts with insufficient information to code with codable children: Secondary | ICD-10-CM

## 2020-08-22 MED ORDER — FREESTYLE LIBRE 14 DAY SENSOR MISC: 1.0000 | 2 refills | Status: DC

## 2020-08-22 MED ORDER — FREESTYLE LIBRE 14 DAY READER DEVI: 1.0000 | Freq: Once | 0 refills | Status: AC

## 2020-08-22 MED ORDER — LEVEMIR FLEXTOUCH 100 UNIT/ML ~~LOC~~ SOPN: 50.0000 [IU] | PEN_INJECTOR | Freq: Every day | SUBCUTANEOUS | 2 refills | Status: DC

## 2020-08-22 NOTE — Patient Instructions (Signed)

## 2020-08-22 NOTE — Progress Notes (Signed)
Endocrinology Follow Up Visit       08/22/2020, 3:47 PM   Subjective:    Patient ID: Kelly Key, male    DOB: 1971-04-17.  Kelly Key is being seen in follow up after being seen in consultation for management of currently uncontrolled symptomatic diabetes requested by  Kelly Gravel, MD.   Past Medical History:  Diagnosis Date  . Acute diastolic CHF (congestive heart failure) ([place]) 06/11/2012   EF 50-55% [facility])  . Chronic kidney disease   . COPD (chronic obstructive pulmonary disease) ([place])   . Diabetes mellitus    A1c 11.5 (06/11/2012).  Kelly Key Dyspnea   . Gout   . Hepatic steatosis 06/11/2012   Elevated LFTs  . Hyperlipemia   . Malignant hypertension   . Microcytic anemia 06/12/2012  . Obesity     Past Surgical History:  Procedure Laterality Date  . AV FISTULA PLACEMENT Left 11/11/2019   Procedure: ARTERIOVENOUS (AV) FISTULA CREATION LEFT BRACHIOCEPHALIC ARM;  Surgeon: Serafina Mitchell, MD;  Location: [facility];  Service: Vascular;  Laterality: Left;  . IR FLUORO GUIDE CV LINE RIGHT  07/08/2019  . IR REMOVAL TUN CV CATH W/O FL  03/23/2020  . IR US GUIDE VASC ACCESS RIGHT  07/08/2019  . NO PAST SURGERIES    . VASCULAR SURGERY      Social History   Socioeconomic History  . Marital status: Single    Spouse name: Not on file  . Number of children: Not on file  . Years of education: Not on file  . Highest education level: Not on file  Occupational History  . Occupation: retired  Tobacco Use  . Smoking status: Never Smoker  . Smokeless tobacco: Never Used  Vaping Use  . Vaping Use: Never used  Substance and Sexual Activity  . Alcohol use: Not Currently    Comment: haven't drank in over 5 years   . Drug use: No  . Sexual activity: Not Currently  Other Topics Concern  . Not on file  Social History Narrative  . Not on file   Social Determinants of Health   Financial Resource  Strain:   . Difficulty of Paying Living Expenses: Not on file  Food Insecurity:   . Worried About Charity fundraiser in the Last Year: Not on file  . Ran Out of Food in the Last Year: Not on file  Transportation Needs:   . Lack of Transportation (Medical): Not on file  . Lack of Transportation (Non-Medical): Not on file  Physical Activity:   . Days of Exercise per Week: Not on file  . Minutes of Exercise per Session: Not on file  Stress:   . Feeling of Stress : Not on file  Social Connections:   . Frequency of Communication with Friends and Family: Not on file  . Frequency of Social Gatherings with Friends and Family: Not on file  . Attends Religious Services: Not on file  . Active Member of Clubs or Organizations: Not on file  . Attends Archivist Meetings: Not on file  . Marital Status: Not on file  Family History  Problem Relation Age of Onset  . Gout Mother   . Asthma Mother   . Diabetes Father   . Heart failure Father   . Diabetes Sister   . Hypertension Brother   . Pancreatic cancer Brother   . Diabetes Sister     Outpatient Encounter Medications as of 08/22/2020  Medication Sig  . acetaminophen (TYLENOL) 500 MG tablet Take 1,000 mg by mouth every 6 (six) hours as needed for moderate pain or headache.  . albuterol (VENTOLIN HFA) 108 (90 Base) MCG/ACT inhaler Inhale 2 puffs into the lungs every 4 (four) hours as needed for wheezing or shortness of breath.   . allopurinol (ZYLOPRIM) 100 MG tablet Take 2 tablets (200 mg total) by mouth daily.  Kelly Key amLODipine (NORVASC) 5 MG tablet Take 1 tablet (5 mg total) by mouth daily.  Kelly Key aspirin EC 81 MG EC tablet Take 1 tablet (81 mg total) by mouth daily.  Kelly Key atorvastatin (LIPITOR) 80 MG tablet Take 1 tablet (80 mg total) by mouth daily.  . Calcium Carbonate Antacid (CALCIUM CARBONATE, DOSED IN MG ELEMENTAL CALCIUM,) 1250 MG/5ML SUSP Take 5 mLs (500 mg of elemental calcium total) by mouth every 6 (six) hours as needed for  indigestion.  . carvedilol (COREG) 25 MG tablet Take 1 tablet (25 mg total) by mouth 2 (two) times daily with a meal.  . cloNIDine (CATAPRES) 0.3 MG tablet Take 1 tablet (0.3 mg total) by mouth 3 (three) times daily.  . diclofenac sodium (VOLTAREN) 1 % GEL Apply 2 g topically 4 (four) times daily. (Patient taking differently: Apply 2 g topically 4 (four) times daily as needed (pain). )  . ezetimibe (ZETIA) 10 MG tablet Take 10 mg by mouth every evening.  . fluticasone (FLONASE) 50 MCG/ACT nasal spray Place 1 spray into both nostrils daily. (Patient taking differently: Place 1 spray into both nostrils daily as needed for allergies. )  . furosemide (LASIX) 80 MG tablet Take 80 mg by mouth daily.  Kelly Key gabapentin (NEURONTIN) 100 MG capsule Take 1 capsule (100 mg total) by mouth 3 (three) times daily.  Kelly Key glucose blood (ACCU-CHEK AVIVA PLUS) test strip Use as instructed TO CHECK BLOOD GLUCOSE THREE TIMES DAILY  . HYDROcodone-acetaminophen (NORCO/VICODIN) 5-325 MG tablet Take 1 tablet by mouth every 6 (six) hours as needed for severe pain.  Kelly Key insulin aspart (NOVOLOG FLEXPEN RELION) 100 UNIT/ML FlexPen Inject 8-14 Units into the skin 3 (three) times daily with meals.  . insulin detemir (LEVEMIR FLEXTOUCH) 100 UNIT/ML FlexPen Inject 50 Units into the skin daily.  . Insulin Pen Needle (B-D ULTRAFINE III SHORT PEN) 31G X 8 MM MISC 1 each by Does not apply route as directed.  . linaclotide (LINZESS) 145 MCG CAPS capsule Take 1 capsule (145 mcg total) by mouth daily before breakfast.  . multivitamin (RENA-VIT) TABS tablet Take 1 tablet by mouth at bedtime.  . sevelamer carbonate (RENVELA) 800 MG tablet Take 3 tablets (2,400 mg total) by mouth 3 (three) times daily with meals.  . [DISCONTINUED] calcium carbonate (TUMS - DOSED IN MG ELEMENTAL CALCIUM) 500 MG chewable tablet Chew 1 tablet by mouth daily as needed for indigestion or heartburn.  . [DISCONTINUED] insulin detemir (LEVEMIR FLEXTOUCH) 100 UNIT/ML FlexPen  Inject 40 Units into the skin daily.  . Continuous Blood Gluc Receiver (FREESTYLE LIBRE 14 DAY READER) DEVI 1 each by Does not apply route once for 1 dose.  . Continuous Blood Gluc Sensor (FREESTYLE LIBRE 14 DAY SENSOR) MISC Inject  1 each into the skin every 14 (fourteen) days. Use as directed.  . [DISCONTINUED] acetaminophen (TYLENOL) 325 MG tablet Take 1-2 tablets (325-650 mg total) by mouth every 4 (four) hours as needed for mild pain. (Patient not taking: Reported on 08/22/2020)   No facility-administered encounter medications on file as of 08/22/2020.    ALLERGIES: Allergies  Allergen Reactions  . Lisinopril Swelling  . Hydralazine Anxiety    Hallucinations     VACCINATION STATUS:  There is no immunization history on file for this patient.  Diabetes He presents for his initial diabetic visit. He has type 2 diabetes mellitus. Onset time: He was diagnosed at approximate age of 43 years. His disease course has been improving. There are no hypoglycemic associated symptoms. Pertinent negatives for hypoglycemia include no confusion, headaches, pallor or seizures. (Reports he starts getting symptoms of hypoglycemia when he drops below 100.) Associated symptoms include blurred vision, foot paresthesias and polydipsia. Pertinent negatives for diabetes include no chest pain, no fatigue, no polyphagia, no polyuria and no weakness. There are no hypoglycemic complications. Symptoms are improving. Diabetic complications include nephropathy. Risk factors for coronary artery disease include dyslipidemia, diabetes mellitus, family history, male sex, obesity, hypertension and sedentary lifestyle. Current diabetic treatment includes intensive insulin program. He is compliant with treatment most of the time. His weight is decreasing steadily. He is following a generally unhealthy diet. When asked about meal planning, he reported none. He has had a previous visit with a dietitian. He rarely participates in  exercise. His home blood glucose trend is fluctuating minimally. His breakfast blood glucose range is generally >200 mg/dl. His lunch blood glucose range is generally >200 mg/dl. His dinner blood glucose range is generally >200 mg/dl. His bedtime blood glucose range is generally >200 mg/dl. His overall blood glucose range is >200 mg/dl. (He presents today with his meter and logs showing appropriate glucose monitoring.  His glycemic profile has improved, yet is still above target both fasting and postprandially.  He denies any hypoglycemia.  He has a fear of his glucose dropping too low. ) An ACE inhibitor/angiotensin II receptor blocker is not being taken. He does not see a podiatrist.Eye exam is current.  Hyperlipidemia This is a chronic problem. The current episode started more than 1 year ago. The problem is uncontrolled. Recent lipid tests were reviewed and are high. Exacerbating diseases include chronic renal disease, diabetes and obesity. Factors aggravating his hyperlipidemia include beta blockers. Pertinent negatives include no chest pain, myalgias or shortness of breath. Current antihyperlipidemic treatment includes statins and ezetimibe. The current treatment provides mild improvement of lipids. Compliance problems include adherence to diet and adherence to exercise.  Risk factors for coronary artery disease include dyslipidemia, diabetes mellitus, family history, hypertension, male sex, obesity and a sedentary lifestyle.  Hypertension This is a chronic problem. The current episode started more than 1 year ago. The problem has been gradually improving since onset. The problem is uncontrolled. Associated symptoms include blurred vision. Pertinent negatives include no chest pain, headaches, neck pain, palpitations or shortness of breath. There are no associated agents to hypertension. Risk factors for coronary artery disease include dyslipidemia, diabetes mellitus, male gender, obesity, sedentary  lifestyle and family history. Past treatments include central alpha agonists, calcium channel blockers and diuretics. The current treatment provides mild improvement. Compliance problems include diet and exercise.  Hypertensive end-organ damage includes kidney disease and heart failure. He has ESRD on dialysis.. Identifiable causes of hypertension include chronic renal disease.    Review of  systems  Constitutional: + Minimally fluctuating body weight,  current Body mass index is 50.28 kg/m. , no fatigue, no subjective hyperthermia, no subjective hypothermia Eyes: no blurry vision, no xerophthalmia ENT: no sore throat, no nodules palpated in throat, no dysphagia/odynophagia, no hoarseness Cardiovascular: no chest pain, no shortness of breath, no palpitations, no leg swelling Respiratory: no cough, no shortness of breath Gastrointestinal: no nausea/vomiting/diarrhea Musculoskeletal: no muscle/joint aches Skin: no rashes, no hyperemia Neurological: no tremors, no numbness, no tingling, no dizziness Psychiatric: no depression, no anxiety  Objective:    BP (!) 154/95 (BP Location: Right Arm, Patient Position: Sitting)   Pulse 77   Wt (!) 321 lb (145.6 kg)   BMI 50.28 kg/m   Wt Readings from Last 3 Encounters:  08/22/20 (!) 321 lb (145.6 kg)  08/13/20 (!) 326 lb 12.8 oz (148.2 kg)  04/11/20 (!) 316 lb 12.8 oz (143.7 kg)    BP Readings from Last 3 Encounters:  08/22/20 (!) 154/95  08/13/20 (!) 142/80  04/11/20 (!) 160/100    Physical Exam- Limited  Constitutional:  Body mass index is 50.28 kg/m. , not in acute distress, normal state of mind Eyes:  EOMI, no exophthalmos Neck: Supple Thyroid: No gross goiter Cardiovascular: RRR, no murmers, rubs, or gallops, no edema, AV fistula in RUE + bruit and thrill Respiratory: Adequate breathing efforts, no crackles, rales, rhonchi, or wheezing Musculoskeletal: no gross deformities, strength intact in all four extremities, no gross  restriction of joint movements Skin:  no rashes, no hyperemia Neurological: no tremor with outstretched hands   CMP ( most recent) CMP     Component Value Date/Time   NA 138 11/11/2019 0636   K 3.7 11/11/2019 0636   CL 100 11/11/2019 0636   CO2 22 08/02/2019 1318   GLUCOSE 121 (H) 11/11/2019 0636   BUN 60 (A) 08/02/2020 0000   CREATININE 7.9 (A) 07/24/2020 0000   CREATININE 4.90 (H) 11/11/2019 0636   CALCIUM 8.4 (L) 08/02/2019 1318   PROT 6.9 06/22/2019 1052   ALBUMIN 2.4 (L) 08/02/2019 1318   AST 21 06/22/2019 1052   ALT 21 06/22/2019 1052   ALKPHOS 107 06/22/2019 1052   BILITOT 0.3 06/22/2019 1052   GFRNONAA 10 (L) 08/02/2019 1318   GFRAA 12 (L) 08/02/2019 1318     Diabetic Labs (most recent): Lab Results  Component Value Date   HGBA1C 10.2 07/24/2020   HGBA1C 12.1 (H) 06/23/2019   HGBA1C 12.4 (H) 06/22/2019     Lipid Panel ( most recent) Lipid Panel     Component Value Date/Time   CHOL 115 07/24/2020 0000   TRIG 168 (A) 07/24/2020 0000   HDL 34 (A) 07/24/2020 0000   LDLCALC 47 07/24/2020 0000      Lab Results  Component Value Date   TSH 2.827 06/11/2012   FREET4 1.35 06/11/2012           Assessment & Plan:   1. Uncontrolled type 2 diabetes mellitus with stage 5 chronic kidney disease ([place])  - Deniz K Bouler has currently uncontrolled symptomatic type 2 DM since  49 years of age. Recent labs reviewed, patient with ESRD on hemodialysis.  He presents today with his meter and logs showing appropriate glucose monitoring.  His glycemic profile has improved, yet is still above target both fasting and postprandially.  He denies any hypoglycemia.  He has a fear of his glucose dropping too low.  He is motivated to do better by his desire to come off dialysis (pending  transplant).  - I had a long discussion with him about the progressive nature of diabetes and the pathology behind its complications. -his diabetes is complicated by ESRD on hemodialysis, CHF,  obesity/sedentary life and he remains at a high risk for more acute and chronic complications which include CAD, CVA, CKD, retinopathy, and neuropathy. These are all discussed in detail with him.  - Nutritional counseling repeated at each appointment due to patients tendency to fall back in to old habits.  - The patient admits there is a room for improvement in their diet and drink choices. -  Suggestion is made for the patient to avoid simple carbohydrates from their diet including Cakes, Sweet Desserts / Pastries, Ice Cream, Soda (diet and regular), Sweet Tea, Candies, Chips, Cookies, Sweet Pastries,  Store Bought Juices, Alcohol in Excess of  1-2 drinks a day, Artificial Sweeteners, Coffee Creamer, and "Sugar-free" Products. This will help patient to have stable blood glucose profile and potentially avoid unintended weight gain.   - I encouraged the patient to switch to  unprocessed or minimally processed complex starch and increased protein intake (animal or plant source), fruits, and vegetables.   - Patient is advised to stick to a routine mealtimes to eat 3 meals  a day and avoid unnecessary snacks ( to snack only to correct hypoglycemia).  - he will be scheduled with Jearld Fenton, RDN, CDE for diabetes education.  - I have approached him with the following individualized plan to manage  his diabetes and patient agrees:   -Based on his persistent hyperglycemia, he will tolerate slight increase in his Levemir to 50 units SQ daily at bedtime.  He is advised to continue Novolog 8-14 units TID with meals if glucose is above 90 and he is eating.  Specific instructions on how to titrate insulin dose based on glucose readings given to patient in writing.  -He is advised to continue monitoring blood glucose at least 4 times per day, before meals and before bed, and call the clinic if he has readings less than 70 or greater than 300 for 3 tests in a row. -This patient could greatly benefit from CGM  and shows appropriate engagement in his diabetes management.  I sent in a Rx for Freestyle Libre to [facility] in [place].  - he is warned not to take insulin without proper monitoring per orders. - he is not a suitable candidate for Metformin, SGLT2 inhibitors.   - he will be considered for incretin therapy as appropriate next visit.  - Specific targets for  A1c;  LDL, HDL,  and Triglycerides were discussed with the patient.  2) Blood Pressure /Hypertension:  His blood pressure is not controlled to target.  He is advised to continue Norvasc 5 mg po daily, Coreg 25 mg po twice daily, Clonidine 0.3 mg TID, and Lasix 80 mg po daily.  Will defer any med changes to nephrology.  3) Lipids/Hyperlipidemia:    His most recent lipid panel from 07/24/20 shows controlled LDL of 47 and elevated triglycerides of 168.  He is advised to continue Atorvastatin 80 mg po daily at bedtime and Zetia 10 mg po daily.  Side effects and precautions discussed with him.  4)  Weight/Diet:  His Body mass index is 50.28 kg/m.  -   clearly complicating his diabetes care.   he is  a candidate for weight loss. I discussed with him the fact that loss of 5 - 10% of his  current body weight will have  the most impact on his diabetes management.  Exercise, and detailed carbohydrates information provided  -  detailed on discharge instructions.  5) Chronic Care/Health Maintenance: -he  is on Statin medications and  is encouraged to initiate and continue to follow up with Ophthalmology, Dentist,  Podiatrist at least yearly or according to recommendations, and advised to   stay away from smoking. I have recommended yearly flu vaccine and pneumonia vaccine at least every 5 years; moderate intensity exercise for up to 150 minutes weekly; and  sleep for at least 7 hours a day.   - he is  advised to maintain close follow up with Kelly Gravel, MD for primary care needs, as well as his other providers for optimal and coordinated  care.   - Time spent on this patient care encounter:  35 min, of which > 50% was spent in  counseling and the rest reviewing his blood glucose logs , discussing his hypoglycemia and hyperglycemia episodes, reviewing his current and  previous labs / studies  ( including abstraction from other facilities) and medications  doses and developing a  long term treatment plan and documenting his care.   Please refer to Patient Instructions for Blood Glucose Monitoring and Insulin/Medications Dosing Guide"  in media tab for additional information. Please  also refer to " Patient Self Inventory" in the Media  tab for reviewed elements of pertinent patient history.  Kelly Key participated in the discussions, expressed understanding, and voiced agreement with the above plans.  All questions were answered to his satisfaction. he is encouraged to contact clinic should he have any questions or concerns prior to his return visit.   Follow up plan: - Return in about 3 months (around 11/22/2020) for Diabetes follow up with A1c in office, No previsit labs.  Rayetta Pigg, [facility] [address] [place], [place] [address] Phone: [phone] Fax: [phone]  08/22/2020, 3:47 PM

## 2020-08-31 DIAGNOSIS — I5032 Chronic diastolic (congestive) heart failure: Secondary | ICD-10-CM | POA: Insufficient documentation

## 2020-08-31 DIAGNOSIS — I151 Hypertension secondary to other renal disorders: Secondary | ICD-10-CM | POA: Insufficient documentation

## 2020-09-07 ENCOUNTER — Telehealth: Payer: Self-pay

## 2020-09-07 NOTE — Telephone Encounter (Signed)
Called patient he is going to give them a call back

## 2020-09-07 NOTE — Telephone Encounter (Signed)
Kelly Key with [facility] left a Vm that they have been unable to reach this pt regarding a RX that was sent over from [facility]. Please call at [phone]

## 2020-09-18 NOTE — Telephone Encounter (Signed)
Left a message on pt's home number requesting a return call to the office. I was unable to reach pt via cell phone number.

## 2020-09-18 NOTE — Telephone Encounter (Signed)
Raquel Sarna from [employer] left another VM stating she can not get in touch with this patient to send out his medical supplies. I tried to call Raquel Sarna back to confirm the number of the patient but I can not get through.

## 2020-09-18 NOTE — Telephone Encounter (Signed)
Pt called back and I gave him [employer] number

## 2020-09-28 ENCOUNTER — Ambulatory Visit: Payer: Medicare Other | Admitting: Cardiology

## 2020-09-28 NOTE — Progress Notes (Deleted)
Clinical Summary Mr. Challis is a 49 y.o.male seen today for follow up of the following medical problem.s   1. Chronic diastolic heart failure - 0/6269 echo: LVEF 60-65%, no WMAs, indeterminate diastolic function - 01/8545 echo LVEF 27-03%, indet diastolic function   - no recent SOB or DOE. Fluid management is per HD, though still makes some urine -  Dry weight 286. He reports has had some issues with HD machine clogging, has built some extra fluid up and weights have trended up but no significant swelling or SOB  2. ESRD - admission 06/2019, long rehab stay after discharge. - admitted with anasarca, started on dialysis   - goes to HD Tues, Thurs, Sat - no recent edema in legs    3. HTN - does not take meds prior to HD. Had some prior issues with low bp's on HD days that have resolved  - on no HD days 130s/90s  4. OSA referal?  Past Medical History:  Diagnosis Date  . Acute diastolic CHF (congestive heart failure) ([place]) 06/11/2012   EF 50-55% [facility])  . Chronic kidney disease   . COPD (chronic obstructive pulmonary disease) ([place])   . Diabetes mellitus    A1c 11.5 (06/11/2012).  [name] Dyspnea   . Gout   . Hepatic steatosis 06/11/2012   Elevated LFTs  . Hyperlipemia   . Malignant hypertension   . Microcytic anemia 06/12/2012  . Obesity      Allergies  Allergen Reactions  . Lisinopril Swelling  . Hydralazine Anxiety    Hallucinations      Current Outpatient Medications  Medication Sig Dispense Refill  . acetaminophen (TYLENOL) 500 MG tablet Take 1,000 mg by mouth every 6 (six) hours as needed for moderate pain or headache.    . albuterol (VENTOLIN HFA) 108 (90 Base) MCG/ACT inhaler Inhale 2 puffs into the lungs every 4 (four) hours as needed for wheezing or shortness of breath.     . allopurinol (ZYLOPRIM) 100 MG tablet Take 2 tablets (200 mg total) by mouth daily. 30 tablet 0  . amLODipine (NORVASC) 5 MG tablet Take 1 tablet (5 mg total) by  mouth daily. 30 tablet 0  . aspirin EC 81 MG EC tablet Take 1 tablet (81 mg total) by mouth daily. 30 tablet 0  . atorvastatin (LIPITOR) 80 MG tablet Take 1 tablet (80 mg total) by mouth daily. 90 tablet 3  . Calcium Carbonate Antacid (CALCIUM CARBONATE, DOSED IN MG ELEMENTAL CALCIUM,) 1250 MG/5ML SUSP Take 5 mLs (500 mg of elemental calcium total) by mouth every 6 (six) hours as needed for indigestion. 450 mL   . carvedilol (COREG) 25 MG tablet Take 1 tablet (25 mg total) by mouth 2 (two) times daily with a meal. 60 tablet 0  . cloNIDine (CATAPRES) 0.3 MG tablet Take 1 tablet (0.3 mg total) by mouth 3 (three) times daily. 90 tablet 0  . Continuous Blood Gluc Sensor (FREESTYLE LIBRE 14 DAY SENSOR) MISC Inject 1 each into the skin every 14 (fourteen) days. Use as directed. 4 each 2  . diclofenac sodium (VOLTAREN) 1 % GEL Apply 2 g topically 4 (four) times daily. (Patient taking differently: Apply 2 g topically 4 (four) times daily as needed (pain). )    . ezetimibe (ZETIA) 10 MG tablet Take 10 mg by mouth every evening.    . fluticasone (FLONASE) 50 MCG/ACT nasal spray Place 1 spray into both nostrils daily. (Patient taking differently: Place 1 spray into both  nostrils daily as needed for allergies. ) 1 g 0  . furosemide (LASIX) 80 MG tablet Take 80 mg by mouth daily.    [name] gabapentin (NEURONTIN) 100 MG capsule Take 1 capsule (100 mg total) by mouth 3 (three) times daily. 90 capsule 0  . glucose blood (ACCU-CHEK AVIVA PLUS) test strip Use as instructed TO CHECK BLOOD GLUCOSE THREE TIMES DAILY 200 each 3  . HYDROcodone-acetaminophen (NORCO/VICODIN) 5-325 MG tablet Take 1 tablet by mouth every 6 (six) hours as needed for severe pain. 10 tablet 0  . insulin aspart (NOVOLOG FLEXPEN RELION) 100 UNIT/ML FlexPen Inject 8-14 Units into the skin 3 (three) times daily with meals. 15 mL 2  . insulin detemir (LEVEMIR FLEXTOUCH) 100 UNIT/ML FlexPen Inject 50 Units into the skin daily. 15 mL 2  . Insulin Pen Needle  (B-D ULTRAFINE III SHORT PEN) 31G X 8 MM MISC 1 each by Does not apply route as directed. 100 each 3  . linaclotide (LINZESS) 145 MCG CAPS capsule Take 1 capsule (145 mcg total) by mouth daily before breakfast. 30 capsule 0  . multivitamin (RENA-VIT) TABS tablet Take 1 tablet by mouth at bedtime. 30 tablet 0  . sevelamer carbonate (RENVELA) 800 MG tablet Take 3 tablets (2,400 mg total) by mouth 3 (three) times daily with meals. 270 tablet 0   No current facility-administered medications for this visit.     Past Surgical History:  Procedure Laterality Date  . AV FISTULA PLACEMENT Left 11/11/2019   Procedure: ARTERIOVENOUS (AV) FISTULA CREATION LEFT BRACHIOCEPHALIC ARM;  Surgeon: Serafina Mitchell, MD;  Location: [facility];  Service: Vascular;  Laterality: Left;  . IR FLUORO GUIDE CV LINE RIGHT  07/08/2019  . IR REMOVAL TUN CV CATH W/O FL  03/23/2020  . IR US GUIDE VASC ACCESS RIGHT  07/08/2019  . NO PAST SURGERIES    . VASCULAR SURGERY       Allergies  Allergen Reactions  . Lisinopril Swelling  . Hydralazine Anxiety    Hallucinations       Family History  Problem Relation Age of Onset  . Gout Mother   . Asthma Mother   . Diabetes Father   . Heart failure Father   . Diabetes Sister   . Hypertension Brother   . Pancreatic cancer Brother   . Diabetes Sister      Social History Mr. Arizpe reports that he has never smoked. He has never used smokeless tobacco. Mr. Rauf reports previous alcohol use.   Review of Systems CONSTITUTIONAL: No weight loss, fever, chills, weakness or fatigue.  HEENT: Eyes: No visual loss, blurred vision, double vision or yellow sclerae.No hearing loss, sneezing, congestion, runny nose or sore throat.  SKIN: No rash or itching.  CARDIOVASCULAR:  RESPIRATORY: No shortness of breath, cough or sputum.  GASTROINTESTINAL: No anorexia, nausea, vomiting or diarrhea. No abdominal pain or blood.  GENITOURINARY: No burning on urination, no polyuria NEUROLOGICAL:  No headache, dizziness, syncope, paralysis, ataxia, numbness or tingling in the extremities. No change in bowel or bladder control.  MUSCULOSKELETAL: No muscle, back pain, joint pain or stiffness.  LYMPHATICS: No enlarged nodes. No history of splenectomy.  PSYCHIATRIC: No history of depression or anxiety.  ENDOCRINOLOGIC: No reports of sweating, cold or heat intolerance. No polyuria or polydipsia.  [name]   Physical Examination There were no vitals filed for this visit. There were no vitals filed for this visit.  Gen: resting comfortably, no acute distress HEENT: no scleral icterus, pupils equal round and  reactive, no palptable cervical adenopathy,  CV Resp: Clear to auscultation bilaterally GI: abdomen is soft, non-tender, non-distended, normal bowel sounds, no hepatosplenomegaly MSK: extremities are warm, no edema.  Skin: warm, no rash Neuro:  no focal deficits Psych: appropriate affect   Diagnostic Studies     Assessment and Plan   1.Chronic diastolic HF - fluid management per HD and nephorlogy - no current symptoms, continue to monitor  2. ESRD - HD per nephrology   3. HTN - reasonable control given issues with labile bp's on HD days, non HD days are reasonable at this time. Continue current meds     Arnoldo Lenis, M.D., F.A.C.C.

## 2020-10-07 IMAGING — US RIGHT LOWER EXTREMITY VENOUS ULTRASOUND
1 series · 14 of 24 positions shown · non-contrast
Comparison: None

CLINICAL DATA: Swelling since yesterday, pain, recent right knee
arthrocentesis

EXAM:
RIGHT LOWER EXTREMITY VENOUS DOPPLER ULTRASOUND
TECHNIQUE: Gray-scale sonography with compression, as well as color and duplex
ultrasound, were performed to evaluate the deep venous system from
the level of the common femoral vein through the popliteal and
proximal calf veins.

[Series 1: right lower extremity venous ultrasound · 14 of 34 slices shown]
[im 1/34]
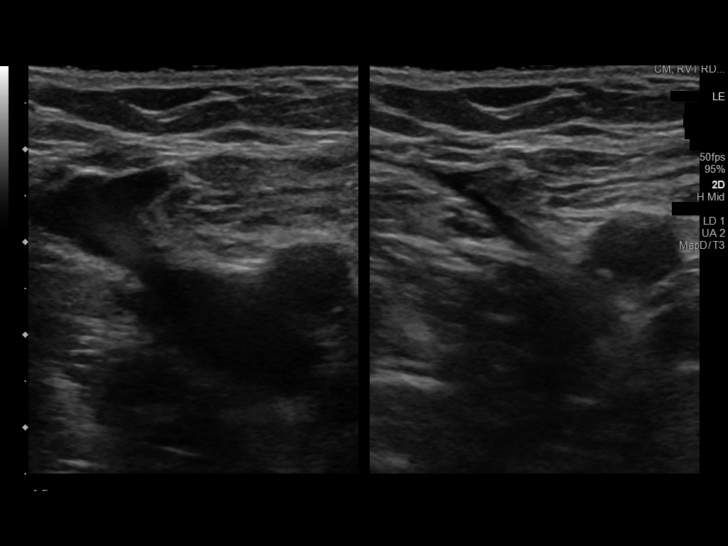
[im 3/34]
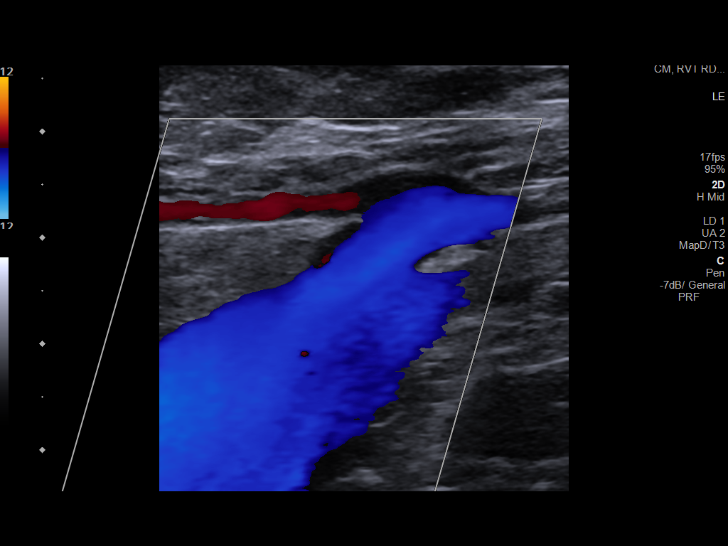
[im 6/34]
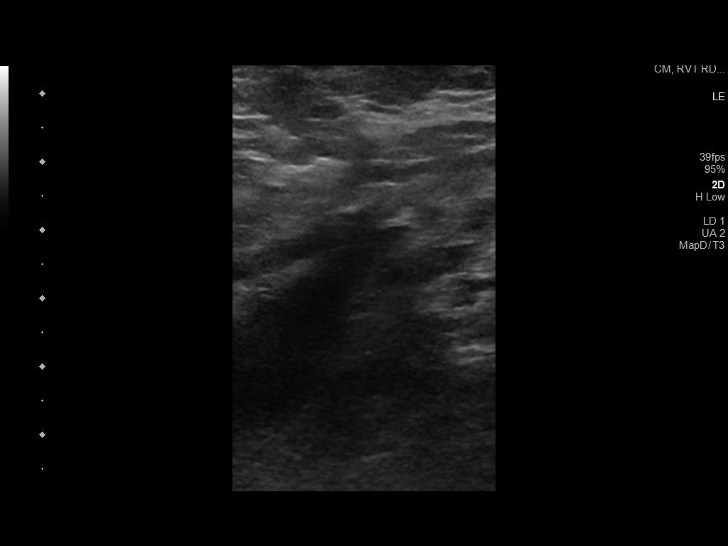
[im 9/34]
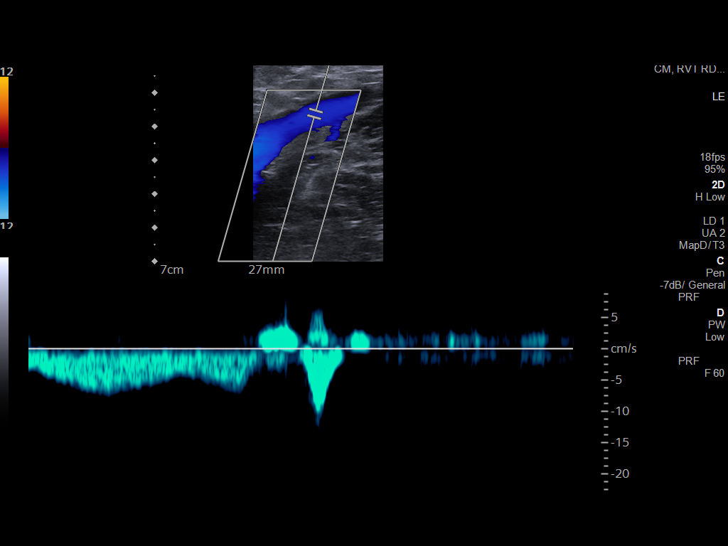
[im 11/34]
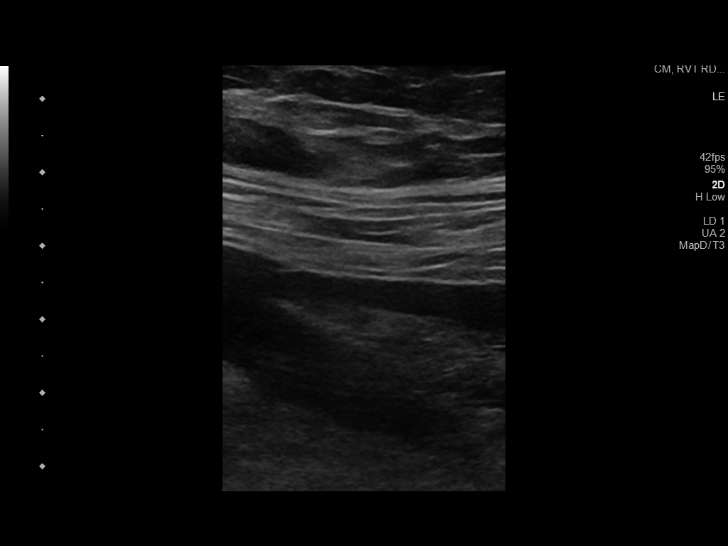
[im 13/34]
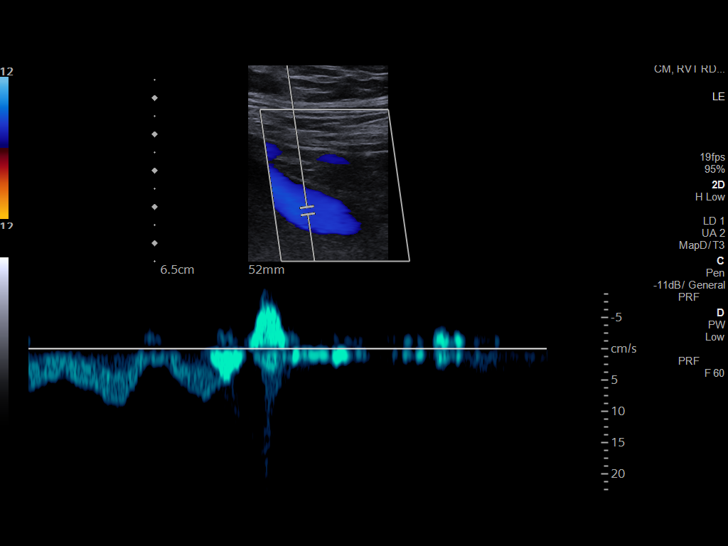
[im 16/34]
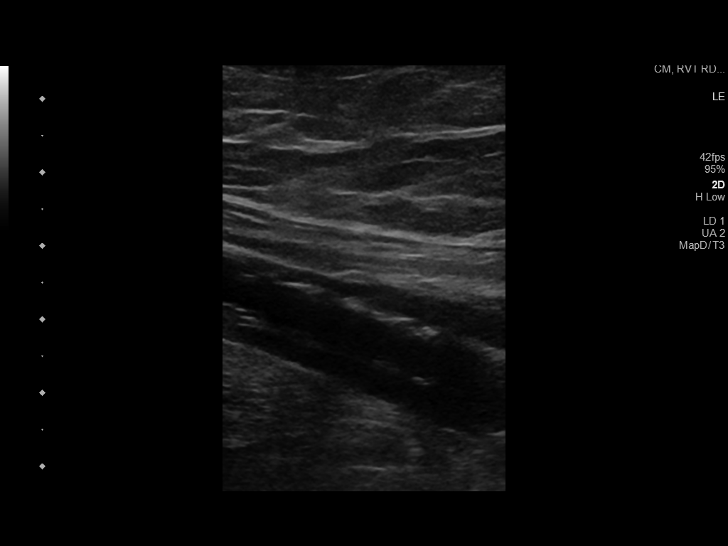
[im 18/34]
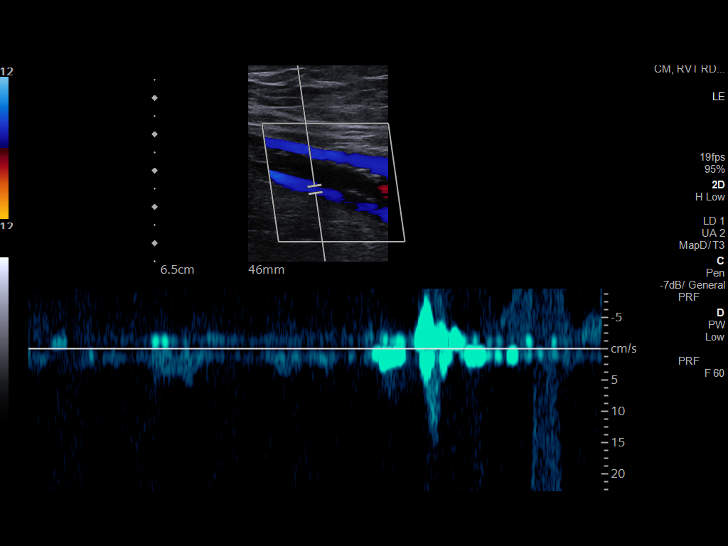
[im 21/34]
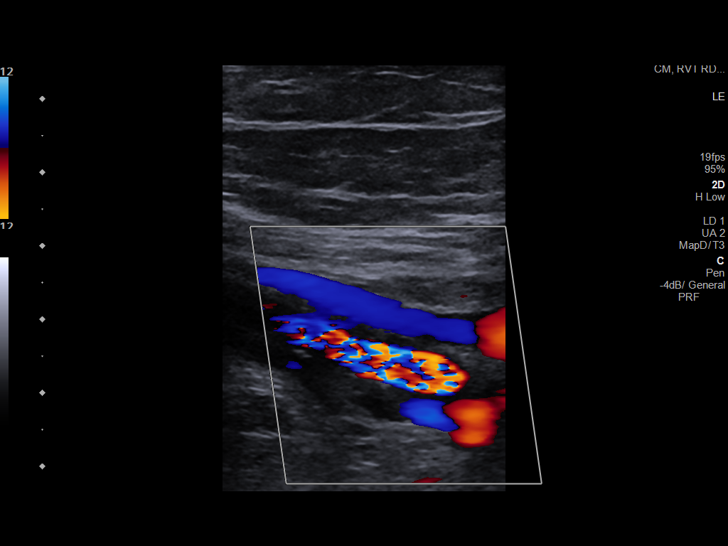
[im 23/34]
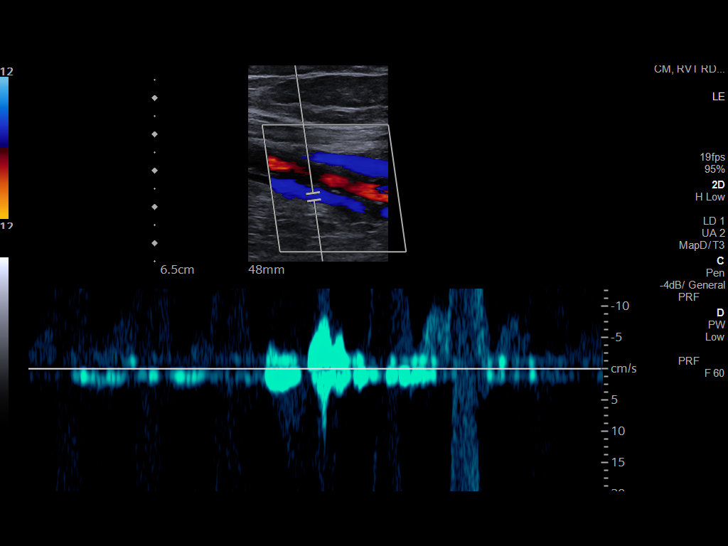
[im 26/34]
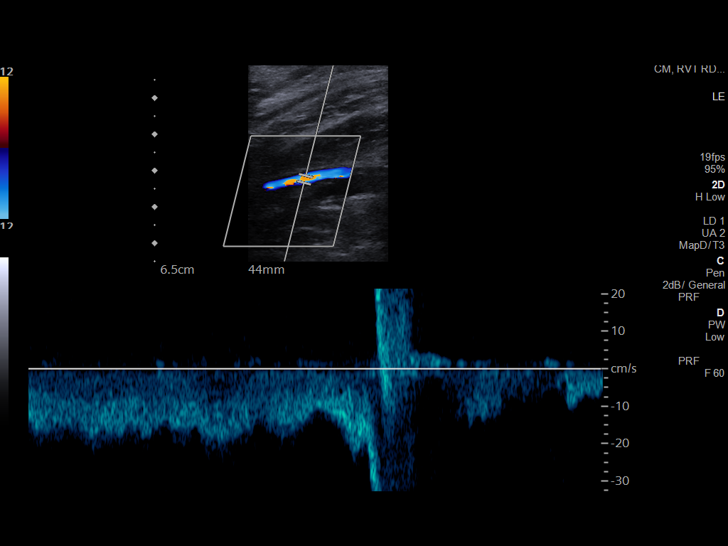
[im 28/34]
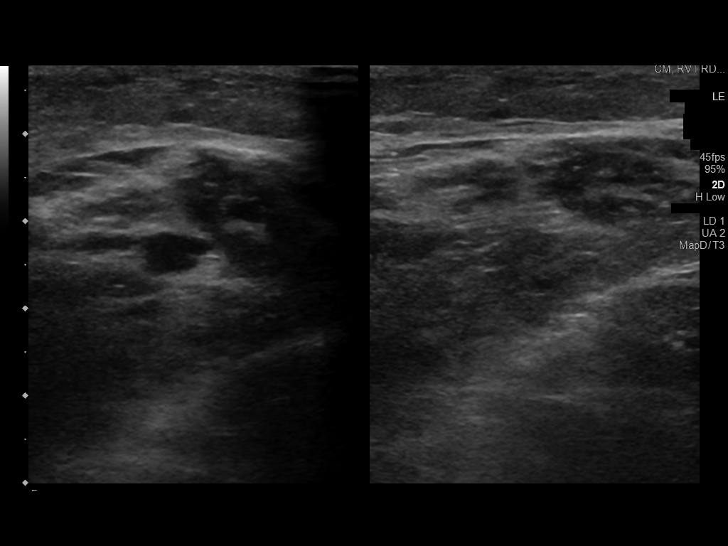
[im 31/34]
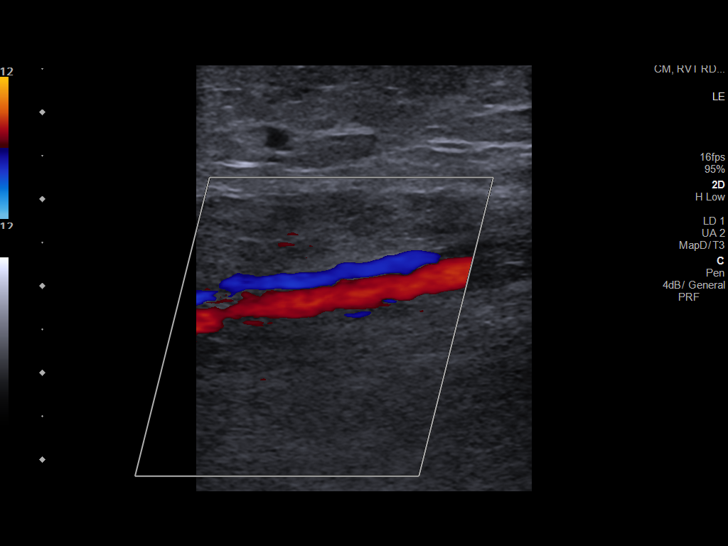
[im 34/34]
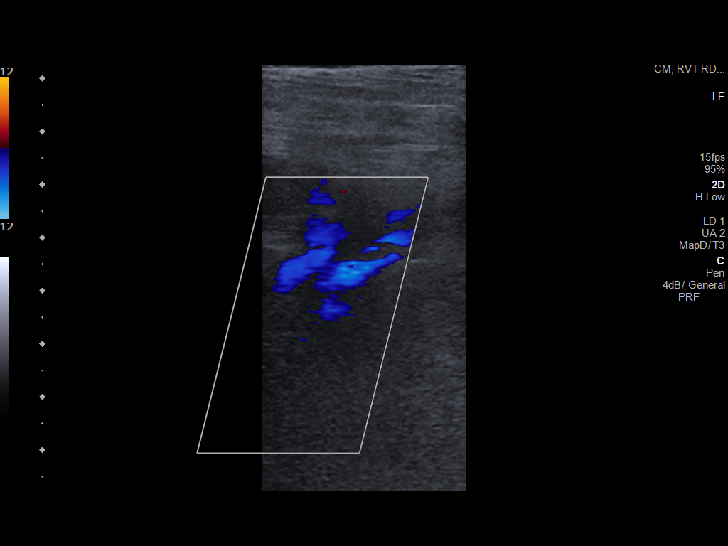

[14 of 24 positions shown; findings below may reference images not displayed]

FINDINGS: Normal compressibility of the common femoral, superficial femoral,
and popliteal veins, as well as the proximal calf veins. No filling
defects to suggest DVT on grayscale or color Doppler imaging.
Doppler waveforms show normal direction of venous flow, normal
respiratory phasicity and response to augmentation.

Visualized segments of the saphenous venous system normal in caliber
and compressibility.

Survey views of the contralateral common femoral vein are
unremarkable.
IMPRESSION: No femoropopliteal and no calf DVT in the visualized calf veins. If
clinical symptoms are inconsistent or if there are persistent or
worsening symptoms, further imaging (possibly involving the iliac
veins) may be warranted.

## 2020-11-26 ENCOUNTER — Encounter: Payer: Self-pay | Admitting: Nurse Practitioner

## 2020-11-26 ENCOUNTER — Other Ambulatory Visit: Payer: Self-pay

## 2020-11-26 ENCOUNTER — Ambulatory Visit (INDEPENDENT_AMBULATORY_CARE_PROVIDER_SITE_OTHER): Payer: Medicare Other | Admitting: Nurse Practitioner

## 2020-11-26 VITALS — BP 140/84 | HR 81 | Ht 67.0 in | Wt 310.0 lb

## 2020-11-26 DIAGNOSIS — I10 Essential (primary) hypertension: Secondary | ICD-10-CM

## 2020-11-26 DIAGNOSIS — E782 Mixed hyperlipidemia: Secondary | ICD-10-CM | POA: Diagnosis not present

## 2020-11-26 DIAGNOSIS — N185 Chronic kidney disease, stage 5: Secondary | ICD-10-CM

## 2020-11-26 DIAGNOSIS — E1165 Type 2 diabetes mellitus with hyperglycemia: Secondary | ICD-10-CM

## 2020-11-26 DIAGNOSIS — E1122 Type 2 diabetes mellitus with diabetic chronic kidney disease: Secondary | ICD-10-CM | POA: Diagnosis not present

## 2020-11-26 DIAGNOSIS — IMO0002 Reserved for concepts with insufficient information to code with codable children: Secondary | ICD-10-CM

## 2020-11-26 NOTE — Patient Instructions (Signed)

## 2020-11-26 NOTE — Progress Notes (Signed)
Endocrinology Follow Up Visit       11/26/2020, 2:16 PM   Subjective:    Patient ID: Kelly Key, male    DOB: 1971/09/26.  Kelly Key is being seen in follow up after being seen in consultation for management of currently uncontrolled symptomatic diabetes requested by  [name], MD.   Past Medical History:  Diagnosis Date  . Acute diastolic CHF (congestive heart failure) ([place]) 06/11/2012   EF 50-55% [facility])  . Chronic kidney disease   . COPD (chronic obstructive pulmonary disease) ([place])   . Diabetes mellitus    A1c 11.5 (06/11/2012).  [name] Dyspnea   . Gout   . Hepatic steatosis 06/11/2012   Elevated LFTs  . Hyperlipemia   . Malignant hypertension   . Microcytic anemia 06/12/2012  . Obesity     Past Surgical History:  Procedure Laterality Date  . AV FISTULA PLACEMENT Left 11/11/2019   Procedure: ARTERIOVENOUS (AV) FISTULA CREATION LEFT BRACHIOCEPHALIC ARM;  Surgeon: Serafina Mitchell, MD;  Location: [facility];  Service: Vascular;  Laterality: Left;  . IR FLUORO GUIDE CV LINE RIGHT  07/08/2019  . IR REMOVAL TUN CV CATH W/O FL  03/23/2020  . IR US GUIDE VASC ACCESS RIGHT  07/08/2019  . NO PAST SURGERIES    . VASCULAR SURGERY      Social History   Socioeconomic History  . Marital status: Single    Spouse name: Not on file  . Number of children: Not on file  . Years of education: Not on file  . Highest education level: Not on file  Occupational History  . Occupation: retired  Tobacco Use  . Smoking status: Never Smoker  . Smokeless tobacco: Never Used  Vaping Use  . Vaping Use: Never used  Substance and Sexual Activity  . Alcohol use: Not Currently    Comment: haven't drank in over 5 years   . Drug use: No  . Sexual activity: Not Currently  Other Topics Concern  . Not on file  Social History Narrative  . Not on file   Social Determinants of Health   Financial Resource Strain: Not on  file  Food Insecurity: Not on file  Transportation Needs: Not on file  Physical Activity: Not on file  Stress: Not on file  Social Connections: Not on file    Family History  Problem Relation Age of Onset  . Gout Mother   . Asthma Mother   . Diabetes Father   . Heart failure Father   . Diabetes Sister   . Hypertension Brother   . Pancreatic cancer Brother   . Diabetes Sister     Outpatient Encounter Medications as of 11/26/2020  Medication Sig  . acetaminophen (TYLENOL) 500 MG tablet Take 1,000 mg by mouth every 6 (six) hours as needed for moderate pain or headache.  . albuterol (VENTOLIN HFA) 108 (90 Base) MCG/ACT inhaler Inhale 2 puffs into the lungs every 4 (four) hours as needed for wheezing or shortness of breath.   . allopurinol (ZYLOPRIM) 100 MG tablet Take 2 tablets (200 mg total) by mouth daily.  [name] amLODipine (NORVASC) 5 MG tablet Take 1 tablet (5 mg total)  by mouth daily.  [name] aspirin EC 81 MG EC tablet Take 1 tablet (81 mg total) by mouth daily.  [name] atorvastatin (LIPITOR) 80 MG tablet Take 1 tablet (80 mg total) by mouth daily.  . Calcium Carbonate Antacid (CALCIUM CARBONATE, DOSED IN MG ELEMENTAL CALCIUM,) 1250 MG/5ML SUSP Take 5 mLs (500 mg of elemental calcium total) by mouth every 6 (six) hours as needed for indigestion.  . carvedilol (COREG) 25 MG tablet Take 1 tablet (25 mg total) by mouth 2 (two) times daily with a meal.  . diclofenac sodium (VOLTAREN) 1 % GEL Apply 2 g topically 4 (four) times daily. (Patient taking differently: Apply 2 g topically 4 (four) times daily as needed (pain).)  . ezetimibe (ZETIA) 10 MG tablet Take 10 mg by mouth every evening.  . furosemide (LASIX) 80 MG tablet Take 80 mg by mouth daily.  [name] gabapentin (NEURONTIN) 100 MG capsule Take 1 capsule (100 mg total) by mouth 3 (three) times daily.  [name] glucose blood (ACCU-CHEK AVIVA PLUS) test strip Use as instructed TO CHECK BLOOD GLUCOSE THREE TIMES DAILY  . HYDROcodone-acetaminophen  (NORCO/VICODIN) 5-325 MG tablet Take 1 tablet by mouth every 6 (six) hours as needed for severe pain.  [name] insulin aspart (NOVOLOG FLEXPEN RELION) 100 UNIT/ML FlexPen Inject 8-14 Units into the skin 3 (three) times daily with meals.  . insulin detemir (LEVEMIR FLEXTOUCH) 100 UNIT/ML FlexPen Inject 50 Units into the skin daily.  . Insulin Pen Needle (B-D ULTRAFINE III SHORT PEN) 31G X 8 MM MISC 1 each by Does not apply route as directed.  . linaclotide (LINZESS) 145 MCG CAPS capsule Take 1 capsule (145 mcg total) by mouth daily before breakfast.  . multivitamin (RENA-VIT) TABS tablet Take 1 tablet by mouth at bedtime.  [name] OZEMPIC, 0.25 OR 0.5 MG/DOSE, 2 MG/1.5ML SOPN Inject into the skin.  [name] sevelamer carbonate (RENVELA) 800 MG tablet Take 3 tablets (2,400 mg total) by mouth 3 (three) times daily with meals.  . cloNIDine (CATAPRES) 0.3 MG tablet Take 1 tablet (0.3 mg total) by mouth 3 (three) times daily.  . Continuous Blood Gluc Sensor (FREESTYLE LIBRE 14 DAY SENSOR) MISC Inject 1 each into the skin every 14 (fourteen) days. Use as directed. (Patient not taking: Reported on 11/26/2020)  . fluticasone (FLONASE) 50 MCG/ACT nasal spray Place 1 spray into both nostrils daily. (Patient taking differently: Place 1 spray into both nostrils daily as needed for allergies.)   No facility-administered encounter medications on file as of 11/26/2020.    ALLERGIES: Allergies  Allergen Reactions  . Lisinopril Swelling  . Hydralazine Anxiety    Hallucinations     VACCINATION STATUS:  There is no immunization history on file for this patient.  Diabetes He presents for his follow-up diabetic visit. He has type 2 diabetes mellitus. Onset time: He was diagnosed at approximate age of 13 years. His disease course has been stable. There are no hypoglycemic associated symptoms. Pertinent negatives for hypoglycemia include no confusion, headaches, pallor or seizures. (Reports he starts getting symptoms of hypoglycemia  when he drops below 100.) Associated symptoms include blurred vision, foot paresthesias and polydipsia. Pertinent negatives for diabetes include no chest pain, no fatigue, no polyphagia, no polyuria and no weakness. There are no hypoglycemic complications. Symptoms are stable. Diabetic complications include heart disease and nephropathy. Risk factors for coronary artery disease include dyslipidemia, diabetes mellitus, family history, male sex, obesity, hypertension and sedentary lifestyle. Current diabetic treatment includes intensive insulin program. He is compliant with treatment  most of the time. His weight is decreasing steadily. He is following a generally unhealthy diet. When asked about meal planning, he reported none. He has had a previous visit with a dietitian. He rarely participates in exercise. His breakfast blood glucose range is generally 180-200 mg/dl. (He presents today with no meter or logs to review.  His most recent A1c was 10.1% on 09/03/20, not due for A1c today.  He denies any episodes of hypoglycemia.  He does report he has had recent difficulty with his meter (he is changing out the battery today).  He is following with surgeon for possible renal transplant.  He has lost 11+ lbs since last visit and BP is better as well. ) An ACE inhibitor/angiotensin II receptor blocker is not being taken. He does not see a podiatrist.Eye exam is current.  Hyperlipidemia This is a chronic problem. The current episode started more than 1 year ago. The problem is uncontrolled. Recent lipid tests were reviewed and are variable. Exacerbating diseases include chronic renal disease, diabetes and obesity. Factors aggravating his hyperlipidemia include beta blockers. Pertinent negatives include no chest pain, myalgias or shortness of breath. Current antihyperlipidemic treatment includes statins and ezetimibe. The current treatment provides mild improvement of lipids. Compliance problems include adherence to diet  and adherence to exercise.  Risk factors for coronary artery disease include dyslipidemia, diabetes mellitus, family history, hypertension, male sex, obesity and a sedentary lifestyle.  Hypertension This is a chronic problem. The current episode started more than 1 year ago. The problem has been gradually improving since onset. The problem is uncontrolled. Associated symptoms include blurred vision. Pertinent negatives include no chest pain, headaches, neck pain, palpitations or shortness of breath. There are no associated agents to hypertension. Risk factors for coronary artery disease include dyslipidemia, diabetes mellitus, male gender, obesity, sedentary lifestyle and family history. Past treatments include central alpha agonists, calcium channel blockers and diuretics. The current treatment provides mild improvement. Compliance problems include diet and exercise.  Hypertensive end-organ damage includes kidney disease and heart failure. He has ESRD on dialysis.. Identifiable causes of hypertension include chronic renal disease.    Review of systems  Constitutional: + steadily decreasing body weight,  current Body mass index is 48.55 kg/m., no fatigue, no subjective hyperthermia, no subjective hypothermia Eyes: no blurry vision, no xerophthalmia ENT: no sore throat, no nodules palpated in throat, no dysphagia/odynophagia, no hoarseness Cardiovascular: no chest pain, no shortness of breath, no palpitations, no leg swelling Respiratory: no cough, no shortness of breath Gastrointestinal: no nausea/vomiting/diarrhea Musculoskeletal: no muscle/joint aches Skin: no rashes, no hyperemia Neurological: no tremors, no numbness, no tingling, no dizziness Psychiatric: no depression, no anxiety  Objective:    BP 140/84 (BP Location: Right Arm)   Pulse 81   Ht '5\' 7"'$  (1.702 m)   Wt (!) 310 lb (140.6 kg)   BMI 48.55 kg/m   Wt Readings from Last 3 Encounters:  11/26/20 (!) 310 lb (140.6 kg)  08/22/20  (!) 321 lb (145.6 kg)  08/13/20 (!) 326 lb 12.8 oz (148.2 kg)    BP Readings from Last 3 Encounters:  11/26/20 140/84  08/22/20 (!) 154/95  08/13/20 (!) 142/80    Physical Exam- Limited  Constitutional:  Body mass index is 48.55 kg/m. , not in acute distress, normal state of mind Eyes:  EOMI, no exophthalmos Neck: Supple Cardiovascular: RRR, no murmers, rubs, or gallops, no edema, AV fistula in RUE + bruit and thrill Respiratory: Adequate breathing efforts, no crackles, rales, rhonchi, or  wheezing Musculoskeletal: no gross deformities, strength intact in all four extremities, no gross restriction of joint movements Skin:  no rashes, no hyperemia Neurological: no tremor with outstretched hands     POCT ABI Results 11/26/20   Right ABI:  1.12      Left ABI:  1.04  Right leg systolic / diastolic: 123XX123 mmHg Left leg systolic / diastolic: 99991111 mmHg  Arm systolic / diastolic: 99991111 mmHG  Detailed report will be scanned into patient chart.  CMP ( most recent) CMP     Component Value Date/Time   NA 138 11/11/2019 0636   K 3.7 11/11/2019 0636   CL 100 11/11/2019 0636   CO2 22 08/02/2019 1318   GLUCOSE 121 (H) 11/11/2019 0636   BUN 60 (A) 08/02/2020 0000   CREATININE 7.9 (A) 07/24/2020 0000   CREATININE 4.90 (H) 11/11/2019 0636   CALCIUM 8.4 (L) 08/02/2019 1318   PROT 6.9 06/22/2019 1052   ALBUMIN 2.4 (L) 08/02/2019 1318   AST 21 06/22/2019 1052   ALT 21 06/22/2019 1052   ALKPHOS 107 06/22/2019 1052   BILITOT 0.3 06/22/2019 1052   GFRNONAA 10 (L) 08/02/2019 1318   GFRAA 12 (L) 08/02/2019 1318     Diabetic Labs (most recent): Lab Results  Component Value Date   HGBA1C 10.2 07/24/2020   HGBA1C 12.1 (H) 06/23/2019   HGBA1C 12.4 (H) 06/22/2019     Lipid Panel ( most recent) Lipid Panel     Component Value Date/Time   CHOL 115 07/24/2020 0000   TRIG 168 (A) 07/24/2020 0000   HDL 34 (A) 07/24/2020 0000   LDLCALC 47 07/24/2020 0000      Lab Results   Component Value Date   TSH 2.827 06/11/2012   FREET4 1.35 06/11/2012           Assessment & Plan:   1) Uncontrolled type 2 diabetes mellitus with stage 5 chronic kidney disease ([place])  - Tyrome K Mangel has currently uncontrolled symptomatic type 2 DM since  50 years of age. Recent labs reviewed, patient with ESRD on hemodialysis.  He presents today with no meter or logs to review.  His most recent A1c was 10.1% on 09/03/20, not due for A1c today.  He denies any episodes of hypoglycemia.  He does report he has had recent difficulty with his meter (he is changing out the battery today).  He is following with surgeon for possible renal transplant.  He has lost 11+ lbs since last visit and BP is better as well.  - I had a long discussion with him about the progressive nature of diabetes and the pathology behind its complications. -his diabetes is complicated by ESRD on hemodialysis, CHF, obesity/sedentary life and he remains at a high risk for more acute and chronic complications which include CAD, CVA, CKD, retinopathy, and neuropathy. These are all discussed in detail with him.  - Nutritional counseling repeated at each appointment due to patients tendency to fall back in to old habits.  - The patient admits there is a room for improvement in their diet and drink choices. -  Suggestion is made for the patient to avoid simple carbohydrates from their diet including Cakes, Sweet Desserts / Pastries, Ice Cream, Soda (diet and regular), Sweet Tea, Candies, Chips, Cookies, Sweet Pastries,  Store Bought Juices, Alcohol in Excess of  1-2 drinks a day, Artificial Sweeteners, Coffee Creamer, and "Sugar-free" Products. This will help patient to have stable blood glucose profile and potentially avoid unintended weight gain.   -  I encouraged the patient to switch to  unprocessed or minimally processed complex starch and increased protein intake (animal or plant source), fruits, and vegetables.   - Patient  is advised to stick to a routine mealtimes to eat 3 meals  a day and avoid unnecessary snacks ( to snack only to correct hypoglycemia).  - I have approached him with the following individualized plan to manage  his diabetes and patient agrees:   -Based on his persistent hyperglycemia, he will tolerate slight increase in his Levemir to 50 units SQ daily at bedtime.  He is advised to continue Novolog 8-14 units TID with meals if glucose is above 90 and he is eating.  Specific instructions on how to titrate insulin dose based on glucose readings given to patient in writing.  -He is advised to continue monitoring blood glucose at least 4 times per day, before meals and before bed, and call the clinic if he has readings less than 70 or greater than 300 for 3 tests in a row.  -I want him to call the clinic and provide me with his readings in about 2 weeks to make sure he is making adequate progress to control his diabetes.  I do not want poor glycemic control to keep him from having renal transplant surgery in the future.  -This patient could greatly benefit from CGM and shows appropriate engagement in his diabetes management.  I sent in a Rx for Freestyle Libre to [facility] in [place] but he could not afford the copay.  - he is warned not to take insulin without proper monitoring per orders. - he is not a suitable candidate for Metformin, SGLT2 inhibitors.   - he will be considered for incretin therapy as appropriate next visit.  - Specific targets for  A1c;  LDL, HDL,  and Triglycerides were discussed with the patient.  2) Blood Pressure /Hypertension:  His blood pressure is controlled to target.  He is advised to continue Norvasc 5 mg po daily, Coreg 25 mg po twice daily, Clonidine 0.3 mg TID, and Lasix 80 mg po daily.  Will defer any med changes to nephrology.  3) Lipids/Hyperlipidemia:    His most recent lipid panel from 09/03/20 shows controlled LDL of 83 and elevated triglycerides of  181.  He is advised to continue Atorvastatin 80 mg po daily at bedtime and Zetia 10 mg po daily.  Side effects and precautions discussed with him.  4)  Weight/Diet:  His Body mass index is 48.55 kg/m.  -  clearly complicating his diabetes care.   he is  a candidate for weight loss. I discussed with him the fact that loss of 5 - 10% of his  current body weight will have the most impact on his diabetes management.  Exercise, and detailed carbohydrates information provided  -  detailed on discharge instructions.  5) Chronic Care/Health Maintenance: -he  is on Statin medications and is encouraged to initiate and continue to follow up with Ophthalmology, Dentist,  Podiatrist at least yearly or according to recommendations, and advised to   stay away from smoking. I have recommended yearly flu vaccine and pneumonia vaccine at least every 5 years; moderate intensity exercise for up to 150 minutes weekly; and  sleep for at least 7 hours a day.   - he is  advised to maintain close follow up with [name], MD for primary care needs, as well as his other providers for optimal and coordinated care.   -  Time spent on this patient care encounter:  30 min, of which > 50% was spent in  counseling and the rest reviewing his blood glucose logs , discussing his hypoglycemia and hyperglycemia episodes, reviewing his current and  previous labs / studies  ( including abstraction from other facilities) and medications  doses and developing a  long term treatment plan and documenting his care.   Please refer to Patient Instructions for Blood Glucose Monitoring and Insulin/Medications Dosing Guide"  in media tab for additional information. Please  also refer to " Patient Self Inventory" in the Media  tab for reviewed elements of pertinent patient history.  Kelly Key participated in the discussions, expressed understanding, and voiced agreement with the above plans.  All questions were answered to his satisfaction. he is  encouraged to contact clinic should he have any questions or concerns prior to his return visit.   Follow up plan: - Return in about 3 months (around 02/23/2021) for Diabetes follow up with A1c in office, No previsit labs, Bring glucometer and logs.  Rayetta Pigg, Doris [facility] [address] [place], [place] [address] Phone: [phone] Fax: [phone]  11/26/2020, 2:16 PM

## 2020-12-10 ENCOUNTER — Emergency Department (HOSPITAL_COMMUNITY): Payer: Medicare Other

## 2020-12-10 ENCOUNTER — Encounter (HOSPITAL_COMMUNITY): Payer: Self-pay | Admitting: *Deleted

## 2020-12-10 ENCOUNTER — Other Ambulatory Visit: Payer: Self-pay

## 2020-12-10 ENCOUNTER — Emergency Department (HOSPITAL_COMMUNITY)
Admission: EM | Admit: 2020-12-10 | Discharge: 2020-12-10 | Disposition: A | Discharge status: Home / Self Care | Payer: Medicare Other | Attending: Emergency Medicine | Admitting: Emergency Medicine

## 2020-12-10 DIAGNOSIS — M7021 Olecranon bursitis, right elbow: Secondary | ICD-10-CM | POA: Diagnosis not present

## 2020-12-10 DIAGNOSIS — E1122 Type 2 diabetes mellitus with diabetic chronic kidney disease: Secondary | ICD-10-CM | POA: Diagnosis not present

## 2020-12-10 DIAGNOSIS — I5042 Chronic combined systolic (congestive) and diastolic (congestive) heart failure: Secondary | ICD-10-CM | POA: Insufficient documentation

## 2020-12-10 DIAGNOSIS — Z794 Long term (current) use of insulin: Secondary | ICD-10-CM | POA: Diagnosis not present

## 2020-12-10 DIAGNOSIS — Y9389 Activity, other specified: Secondary | ICD-10-CM | POA: Insufficient documentation

## 2020-12-10 DIAGNOSIS — Z992 Dependence on renal dialysis: Secondary | ICD-10-CM | POA: Insufficient documentation

## 2020-12-10 DIAGNOSIS — Z79899 Other long term (current) drug therapy: Secondary | ICD-10-CM | POA: Insufficient documentation

## 2020-12-10 DIAGNOSIS — N186 End stage renal disease: Secondary | ICD-10-CM | POA: Diagnosis not present

## 2020-12-10 DIAGNOSIS — I132 Hypertensive heart and chronic kidney disease with heart failure and with stage 5 chronic kidney disease, or end stage renal disease: Secondary | ICD-10-CM | POA: Diagnosis not present

## 2020-12-10 DIAGNOSIS — R2231 Localized swelling, mass and lump, right upper limb: Secondary | ICD-10-CM | POA: Diagnosis present

## 2020-12-10 LAB — BASIC METABOLIC PANEL
Anion gap: 11 (ref 5–15)
BUN: 55 mg/dL — ABNORMAL HIGH (ref 6–20)
CO2: 27 mmol/L (ref 22–32)
Calcium: 9 mg/dL (ref 8.9–10.3)
Chloride: 96 mmol/L — ABNORMAL LOW (ref 98–111)
Creatinine, Ser: 8.32 mg/dL — ABNORMAL HIGH (ref 0.61–1.24)
GFR, Estimated: 7 mL/min — ABNORMAL LOW (ref >60)
Glucose, Bld: 104 mg/dL — ABNORMAL HIGH (ref 70–99)
Potassium: 4.4 mmol/L (ref 3.5–5.1)
Sodium: 134 mmol/L — ABNORMAL LOW (ref 135–145)

## 2020-12-10 LAB — CBC WITH DIFFERENTIAL/PLATELET
Abs Immature Granulocytes: 0.01 10*3/uL (ref 0.00–0.07)
Basophils Absolute: 0.1 10*3/uL (ref 0.0–0.1)
Basophils Relative: 1 %
Eosinophils Absolute: 0.7 10*3/uL — ABNORMAL HIGH (ref 0.0–0.5)
Eosinophils Relative: 10 %
HCT: 32.5 % — ABNORMAL LOW (ref 39.0–52.0)
Hemoglobin: 10.3 g/dL — ABNORMAL LOW (ref 13.0–17.0)
Immature Granulocytes: 0 %
Lymphocytes Relative: 37 %
Lymphs Abs: 2.6 10*3/uL (ref 0.7–4.0)
MCH: 28.8 pg (ref 26.0–34.0)
MCHC: 31.7 g/dL (ref 30.0–36.0)
MCV: 90.8 fL (ref 80.0–100.0)
Monocytes Absolute: 0.6 10*3/uL (ref 0.1–1.0)
Monocytes Relative: 8 %
Neutro Abs: 3.1 10*3/uL (ref 1.7–7.7)
Neutrophils Relative %: 44 %
Platelets: 274 10*3/uL (ref 150–400)
RBC: 3.58 MIL/uL — ABNORMAL LOW (ref 4.22–5.81)
RDW: 14.2 % (ref 11.5–15.5)
WBC: 7 10*3/uL (ref 4.0–10.5)
nRBC: 0 % (ref 0.0–0.2)

## 2020-12-10 NOTE — Discharge Instructions (Signed)
You likely have anti-inflammatory bursitis of your elbow. No evidence on your work-up today for infection. You may wear the Ace wrap on and off as needed for comfort. Call Dr. Amedeo Kinsman office in a few days to arrange a follow-up appointment if not improving.

## 2020-12-10 NOTE — ED Provider Notes (Signed)
[facility] EMERGENCY DEPARTMENT Provider Note   CSN: [id number] Arrival date & time: 12/10/20  1254     History Chief Complaint  Patient presents with  . Joint Swelling    Kelly Key is a 50 y.o. male.  HPI      Kelly Key is a 50 y.o. male with past medical history of type 2 diabetes, gout, chronic kidney disease with dialysis Tuesday Thursday Saturdays, who presents to the Emergency Department complaining of focal swelling of his right elbow for 3 days.  He noticed the swelling and became concerned of a possible infection.  He also reports some "tingling and numbness" to his elbow.  No known trauma or injury.  No skin changes or open wounds of his arm.  No significant pain to his elbow. No alleviating or aggravating factors.   He tried ice to his elbow without relief.     Past Medical History:  Diagnosis Date  . Acute diastolic CHF (congestive heart failure) ([place]) 06/11/2012   EF 50-55% [facility])  . Chronic kidney disease   . COPD (chronic obstructive pulmonary disease) ([place])   . Diabetes mellitus    A1c 11.5 (06/11/2012).  [name] Dyspnea   . Gout   . Hepatic steatosis 06/11/2012   Elevated LFTs  . Hyperlipemia   . Malignant hypertension   . Microcytic anemia 06/12/2012  . Obesity     Patient Active Problem List   Diagnosis Date Noted  . Mixed hyperlipidemia 08/13/2020  . Acute gout of right knee 08/03/2019  . Orthostasis   . Muscle strain of right wrist   . Pain   . Physical debility 07/16/2019  . ESRD on dialysis ([place])   . Anemia of chronic disease   . Uncontrolled type 2 diabetes mellitus with hyperglycemia ([place])   . Congestive heart failure ([place])   . Volume overload 07/05/2019  . Anasarca 07/03/2019  . Dyslipidemia 07/03/2019  . Chronic combined systolic and diastolic CHF (congestive heart failure) ([place]) 07/03/2019  . Bilateral leg edema   . CKD (chronic kidney disease) stage 5, GFR less than 15 ml/min (H[facility]) 06/22/2019  . Uncontrolled type 2  diabetes mellitus with hyperglycemia, with long-term current use of insulin ([place]) 06/22/2019  . Hypokalemia   . Thromboembolism ([place]) 02/20/2019  . AKI (acute kidney injury) ([place]) 02/20/2019  . Acute on chronic diastolic CHF (congestive heart failure) ([place]) 03/26/2018  . Acute respiratory failure with hypoxia ([place]) 03/26/2018  . Uncontrolled type 2 diabetes mellitus with stage 5 chronic kidney disease ([place]) 06/12/2012  . Hyponatremia 06/12/2012  . Microcytic anemia 06/12/2012  . Essential hypertension, benign 06/11/2012  . Elevated LFTs 06/11/2012  . Morbid obesity ([place]) 06/11/2012  . Hepatic steatosis 06/11/2012    Past Surgical History:  Procedure Laterality Date  . AV FISTULA PLACEMENT Left 11/11/2019   Procedure: ARTERIOVENOUS (AV) FISTULA CREATION LEFT BRACHIOCEPHALIC ARM;  Surgeon: Serafina Mitchell, MD;  Location: [place];  Service: Vascular;  Laterality: Left;  . IR FLUORO GUIDE CV LINE RIGHT  07/08/2019  . IR REMOVAL TUN CV CATH W/O FL  03/23/2020  . IR US GUIDE VASC ACCESS RIGHT  07/08/2019  . NO PAST SURGERIES    . VASCULAR SURGERY         Family History  Problem Relation Age of Onset  . Gout Mother   . Asthma Mother   . Diabetes Father   . Heart failure Father   . Diabetes Sister   . Hypertension Brother   .  Pancreatic cancer Brother   . Diabetes Sister     Social History   Tobacco Use  . Smoking status: Never Smoker  . Smokeless tobacco: Never Used  Vaping Use  . Vaping Use: Never used  Substance Use Topics  . Alcohol use: Not Currently    Comment: haven't drank in over 5 years   . Drug use: No    Home Medications Prior to Admission medications   Medication Sig Start Date End Date Taking? Authorizing Provider  acetaminophen (TYLENOL) 500 MG tablet Take 1,000 mg by mouth every 6 (six) hours as needed for moderate pain or headache.    [provider]  albuterol (VENTOLIN HFA) 108 (90 Base) MCG/ACT inhaler Inhale 2 puffs into the lungs every 4  (four) hours as needed for wheezing or shortness of breath.  03/24/19   [provider]  allopurinol (ZYLOPRIM) 100 MG tablet Take 2 tablets (200 mg total) by mouth daily. 08/04/19   Love, Ivan Anchors, PA-C  amLODipine (NORVASC) 5 MG tablet Take 1 tablet (5 mg total) by mouth daily. 08/03/19   Love, Ivan Anchors, PA-C  aspirin EC 81 MG EC tablet Take 1 tablet (81 mg total) by mouth daily. 08/04/19   Love, Ivan Anchors, PA-C  atorvastatin (LIPITOR) 80 MG tablet Take 1 tablet (80 mg total) by mouth daily. 12/05/19   Arnoldo Lenis, MD  Calcium Carbonate Antacid (CALCIUM CARBONATE, DOSED IN MG ELEMENTAL CALCIUM,) 1250 MG/5ML SUSP Take 5 mLs (500 mg of elemental calcium total) by mouth every 6 (six) hours as needed for indigestion. 07/16/19   Amin, Jeanella Flattery, MD  carvedilol (COREG) 25 MG tablet Take 1 tablet (25 mg total) by mouth 2 (two) times daily with a meal. 08/03/19   Love, Ivan Anchors, PA-C  cloNIDine (CATAPRES) 0.3 MG tablet Take 1 tablet (0.3 mg total) by mouth 3 (three) times daily. 08/03/19 08/22/20  Love, Ivan Anchors, PA-C  Continuous Blood Gluc Sensor (FREESTYLE LIBRE 14 DAY SENSOR) MISC Inject 1 each into the skin every 14 (fourteen) days. Use as directed. Patient not taking: Reported on 11/26/2020 08/22/20   Brita Romp, NP  diclofenac sodium (VOLTAREN) 1 % GEL Apply 2 g topically 4 (four) times daily. Patient taking differently: Apply 2 g topically 4 (four) times daily as needed (pain). 08/03/19   Love, Ivan Anchors, PA-C  ezetimibe (ZETIA) 10 MG tablet Take 10 mg by mouth every evening. 01/20/19   [provider]  fluticasone (FLONASE) 50 MCG/ACT nasal spray Place 1 spray into both nostrils daily. Patient taking differently: Place 1 spray into both nostrils daily as needed for allergies. 04/04/18 08/22/20  Manuella Ghazi, Pratik D, DO  furosemide (LASIX) 80 MG tablet Take 80 mg by mouth daily. 10/11/19   [provider]  gabapentin (NEURONTIN) 100 MG capsule Take 1 capsule (100 mg total) by  mouth 3 (three) times daily. 08/03/19   Love, Ivan Anchors, PA-C  glucose blood (ACCU-CHEK AVIVA PLUS) test strip Use as instructed TO CHECK BLOOD GLUCOSE THREE TIMES DAILY 08/15/20   Nida, Marella Chimes, MD  HYDROcodone-acetaminophen (NORCO/VICODIN) 5-325 MG tablet Take 1 tablet by mouth every 6 (six) hours as needed for severe pain. 11/11/19   Dagoberto Ligas, PA-C  insulin aspart (NOVOLOG FLEXPEN RELION) 100 UNIT/ML FlexPen Inject 8-14 Units into the skin 3 (three) times daily with meals. 08/13/20   Cassandria Anger, MD  insulin detemir (LEVEMIR FLEXTOUCH) 100 UNIT/ML FlexPen Inject 50 Units into the skin daily. 08/22/20   Rayetta Pigg  J, NP  Insulin Pen Needle (B-D ULTRAFINE III SHORT PEN) 31G X 8 MM MISC 1 each by Does not apply route as directed. 08/13/20   Cassandria Anger, MD  linaclotide Rolan Lipa) 145 MCG CAPS capsule Take 1 capsule (145 mcg total) by mouth daily before breakfast. 04/05/18   Manuella Ghazi, Pratik D, DO  multivitamin (RENA-VIT) TABS tablet Take 1 tablet by mouth at bedtime. 08/03/19   Love, Ivan Anchors, PA-C  OZEMPIC, 0.25 OR 0.5 MG/DOSE, 2 MG/1.5ML SOPN Inject into the skin. 11/23/20   [provider]  sevelamer carbonate (RENVELA) 800 MG tablet Take 3 tablets (2,400 mg total) by mouth 3 (three) times daily with meals. 08/03/19   Love, Ivan Anchors, PA-C    Allergies    Lisinopril and Hydralazine  Review of Systems   Review of Systems  Constitutional: Negative for chills and fever.  Gastrointestinal: Negative for nausea and vomiting.  Musculoskeletal: Positive for joint swelling (swelling of the right elbow). Negative for arthralgias and neck pain.  Skin: Negative for color change and wound.  Neurological: Negative for weakness, numbness and headaches.    Physical Exam Updated Vital Signs BP (!) 179/98 (BP Location: Right Wrist)   Pulse 82   Temp (!) 97.3 F (36.3 C) (Oral)   Resp 20   Wt (!) 142 kg   SpO2 98%   BMI 49.04 kg/m   Physical Exam Vitals and  nursing note reviewed.  Constitutional:      General: He is not in acute distress.    Appearance: Normal appearance. He is not ill-appearing.  Cardiovascular:     Rate and Rhythm: Normal rate and regular rhythm.     Pulses: Normal pulses.  Pulmonary:     Effort: Pulmonary effort is normal.     Breath sounds: Normal breath sounds.  Chest:     Chest wall: No tenderness.  Musculoskeletal:        General: Swelling (focal edema along the olecranon process fo the right elbow.  no excressive warmth or edema.  no open wounds.  ) present. No tenderness or signs of injury.     Cervical back: No tenderness.     Comments: Pt has FROM of the right elbow w/o difficulty  Skin:    General: Skin is warm.     Capillary Refill: Capillary refill takes less than 2 seconds.     Findings: No bruising, erythema or rash.  Neurological:     General: No focal deficit present.     Mental Status: He is alert.     Sensory: No sensory deficit.     Motor: No weakness.     ED Results / Procedures / Treatments   Labs (all labs ordered are listed, but only abnormal results are displayed) Labs Reviewed  BASIC METABOLIC PANEL - Abnormal; Notable for the following components:      Result Value   Sodium 134 (*)    Chloride 96 (*)    Glucose, Bld 104 (*)    BUN 55 (*)    Creatinine, Ser 8.32 (*)    GFR, Estimated 7 (*)    All other components within normal limits  CBC WITH DIFFERENTIAL/PLATELET - Abnormal; Notable for the following components:   RBC 3.58 (*)    Hemoglobin 10.3 (*)    HCT 32.5 (*)    Eosinophils Absolute 0.7 (*)    All other components within normal limits    EKG None  Radiology DG Elbow Complete Right  Result Date: 12/10/2020  CLINICAL DATA:  Soft tissue swelling EXAM: RIGHT ELBOW - COMPLETE 3+ VIEW COMPARISON:  None. FINDINGS: Frontal, lateral, and bilateral oblique views were obtained. There is marked soft tissue prominence in the region of the olecranon bursa. There is no fracture  or dislocation. No joint effusion. No joint space narrowing or erosion. There is a spur along the coracoid process of the proximal ulna. No erosive change or bony destruction. IMPRESSION: Marked enlargement of the olecranon bursa. Question fluid in this area. Infection in this area cannot be excluded radiographically. No erosive change or bony destruction noted. No fracture or dislocation. No joint space narrowing. Spur along the coracoid process of the proximal ulna noted. Electronically Signed   By: Lowella Grip III M.D.   On: 12/10/2020 13:24    Procedures Procedures   Medications Ordered in ED Medications - No data to display  ED Course  I have reviewed the triage vital signs and the nursing notes.  Pertinent labs & imaging results that were available during my care of the patient were reviewed by me and considered in my medical decision making (see chart for details).    MDM Rules/Calculators/A&P                          Patient here with focal swelling along the right olecranon bursa. No excessive warmth or erythema. Doubt septic joint. X-ray without acute bony findings. Neurovascularly intact. Symptoms likely related to olecranon bursitis. Doubt infectious process. No leukocytosis, serum creatinine elevated, he is a dialysis patient and scheduled for dialysis tomorrow. Feel that he is appropriate for discharge home, will treat conservatively and patient agrees to follow-up with orthopedics.  Pt also seen by Dr. Roderic Palau and care plan discussed.   Final Clinical Impression(s) / ED Diagnoses Final diagnoses:  Olecranon bursitis, right elbow    Rx / DC Orders ED Discharge Orders    None       Kem Parkinson, PA-C 12/10/20 1641    Milton Ferguson, MD 12/12/20 1053

## 2020-12-10 NOTE — ED Triage Notes (Signed)
Knot on right elbow

## 2020-12-10 NOTE — ED Notes (Addendum)
Entered room and introduced self to patient. Pt appears to be resting in bed, respirations are even and unlabored with equal chest rise and fall. Bed is locked in the lowest position, side rails x1, call bell within reach. Pt educated on call light use and hourly rounding, verbalized understanding and in agreement at this time. All questions and concerns voiced addressed. Refreshments offered and provided per patient request.

## 2020-12-15 ENCOUNTER — Emergency Department (HOSPITAL_COMMUNITY): Payer: Medicare Other

## 2020-12-15 ENCOUNTER — Encounter (HOSPITAL_COMMUNITY): Payer: Self-pay

## 2020-12-15 ENCOUNTER — Other Ambulatory Visit: Payer: Self-pay

## 2020-12-15 ENCOUNTER — Emergency Department (HOSPITAL_COMMUNITY)
Admission: EM | Admit: 2020-12-15 | Discharge: 2020-12-15 | Disposition: A | Discharge status: Home / Self Care | Payer: Medicare Other | Attending: Emergency Medicine | Admitting: Emergency Medicine

## 2020-12-15 DIAGNOSIS — N186 End stage renal disease: Secondary | ICD-10-CM | POA: Insufficient documentation

## 2020-12-15 DIAGNOSIS — Z7982 Long term (current) use of aspirin: Secondary | ICD-10-CM | POA: Diagnosis not present

## 2020-12-15 DIAGNOSIS — I5042 Chronic combined systolic (congestive) and diastolic (congestive) heart failure: Secondary | ICD-10-CM | POA: Insufficient documentation

## 2020-12-15 DIAGNOSIS — Z992 Dependence on renal dialysis: Secondary | ICD-10-CM | POA: Diagnosis not present

## 2020-12-15 DIAGNOSIS — R109 Unspecified abdominal pain: Secondary | ICD-10-CM | POA: Diagnosis not present

## 2020-12-15 DIAGNOSIS — I132 Hypertensive heart and chronic kidney disease with heart failure and with stage 5 chronic kidney disease, or end stage renal disease: Secondary | ICD-10-CM | POA: Insufficient documentation

## 2020-12-15 DIAGNOSIS — J449 Chronic obstructive pulmonary disease, unspecified: Secondary | ICD-10-CM | POA: Insufficient documentation

## 2020-12-15 DIAGNOSIS — R310 Gross hematuria: Secondary | ICD-10-CM | POA: Insufficient documentation

## 2020-12-15 DIAGNOSIS — Z79899 Other long term (current) drug therapy: Secondary | ICD-10-CM | POA: Insufficient documentation

## 2020-12-15 DIAGNOSIS — Z794 Long term (current) use of insulin: Secondary | ICD-10-CM | POA: Insufficient documentation

## 2020-12-15 DIAGNOSIS — E1122 Type 2 diabetes mellitus with diabetic chronic kidney disease: Secondary | ICD-10-CM | POA: Insufficient documentation

## 2020-12-15 LAB — CBC WITH DIFFERENTIAL/PLATELET
Abs Immature Granulocytes: 0.02 10*3/uL (ref 0.00–0.07)
Basophils Absolute: 0.1 10*3/uL (ref 0.0–0.1)
Basophils Relative: 1 %
Eosinophils Absolute: 0.4 10*3/uL (ref 0.0–0.5)
Eosinophils Relative: 5 %
HCT: 35.8 % — ABNORMAL LOW (ref 39.0–52.0)
Hemoglobin: 11.3 g/dL — ABNORMAL LOW (ref 13.0–17.0)
Immature Granulocytes: 0 %
Lymphocytes Relative: 24 %
Lymphs Abs: 2 10*3/uL (ref 0.7–4.0)
MCH: 28.8 pg (ref 26.0–34.0)
MCHC: 31.6 g/dL (ref 30.0–36.0)
MCV: 91.1 fL (ref 80.0–100.0)
Monocytes Absolute: 0.7 10*3/uL (ref 0.1–1.0)
Monocytes Relative: 8 %
Neutro Abs: 5.2 10*3/uL (ref 1.7–7.7)
Neutrophils Relative %: 62 %
Platelets: 276 10*3/uL (ref 150–400)
RBC: 3.93 MIL/uL — ABNORMAL LOW (ref 4.22–5.81)
RDW: 14.2 % (ref 11.5–15.5)
WBC: 8.4 10*3/uL (ref 4.0–10.5)
nRBC: 0 % (ref 0.0–0.2)

## 2020-12-15 LAB — BASIC METABOLIC PANEL
Anion gap: 10 (ref 5–15)
BUN: 29 mg/dL — ABNORMAL HIGH (ref 6–20)
CO2: 30 mmol/L (ref 22–32)
Calcium: 9.1 mg/dL (ref 8.9–10.3)
Chloride: 97 mmol/L — ABNORMAL LOW (ref 98–111)
Creatinine, Ser: 5.78 mg/dL — ABNORMAL HIGH (ref 0.61–1.24)
GFR, Estimated: 11 mL/min — ABNORMAL LOW (ref >60)
Glucose, Bld: 137 mg/dL — ABNORMAL HIGH (ref 70–99)
Potassium: 4.2 mmol/L (ref 3.5–5.1)
Sodium: 137 mmol/L (ref 135–145)

## 2020-12-15 LAB — URINALYSIS, ROUTINE W REFLEX MICROSCOPIC: Nitrite: POSITIVE — AB

## 2020-12-15 LAB — URINALYSIS, MICROSCOPIC (REFLEX): RBC / HPF: >50 RBC/hpf (ref 0–5)

## 2020-12-15 MED ORDER — OXYCODONE-ACETAMINOPHEN 5-325 MG PO TABS
1.0000 | ORAL_TABLET | Freq: Once | ORAL | Status: AC
  Administered 2020-12-15: 1 via ORAL
  Filled 2020-12-15: qty 1

## 2020-12-15 MED ORDER — CEPHALEXIN 500 MG PO CAPS: 500.0000 mg | ORAL_CAPSULE | Freq: Four times a day (QID) | ORAL | 0 refills | Status: DC

## 2020-12-15 MED ORDER — LIDOCAINE HCL (PF) 1 % IJ SOLN
INTRAMUSCULAR | Status: AC
  Filled 2020-12-15: qty 30

## 2020-12-15 MED ORDER — CEFTRIAXONE SODIUM 1 G IJ SOLR
1.0000 g | Freq: Once | INTRAMUSCULAR | Status: AC
  Administered 2020-12-15: 1 g via INTRAMUSCULAR
  Filled 2020-12-15: qty 10

## 2020-12-15 NOTE — ED Triage Notes (Signed)
Pt to er, pt states that he is a dialysis pt, states that he had dialysis today, states that it went normally; however, towards the end of dialysis he had a sudden onset of R flank pain, states that then he went to urinate and it was nothing but blood, pt states that he continues to have R flank pain.

## 2020-12-15 NOTE — ED Provider Notes (Signed)
[facility] EMERGENCY DEPARTMENT Provider Note   CSN: [id number] Arrival date & time: 12/15/20  1210     History Chief Complaint  Patient presents with  . Flank Pain    Kelly Key is a 50 y.o. male.  HPI      Kelly Key is a 50 y.o. male with past medical history of CHF, diabetes, chronic kidney disease on dialysis Tuesday Thursday Saturday who presents to the Emergency Department complaining of sudden onset of right flank pain and hematuria while being dialyzed this morning. He describes a sharp constant pain to his right flank that is nonradiating. Pain became so severe that he requested dialysis be stopped 28 minutes prior to completion of his treatment. He states he went home and pain became worse which prompted him to come to the emergency department. He states he voided while at home and noticed bright red blood. He denies having previous episodes of hematuria. States urine flow is decreased. He denies abdominal pain, fever, chills, burning with urination, nausea or vomiting. No history of kidney stones.   Past Medical History:  Diagnosis Date  . Acute diastolic CHF (congestive heart failure) ([place]) 06/11/2012   EF 50-55% [facility])  . Chronic kidney disease   . COPD (chronic obstructive pulmonary disease) ([place])   . Diabetes mellitus    A1c 11.5 (06/11/2012).  [name] Dyspnea   . Gout   . Hepatic steatosis 06/11/2012   Elevated LFTs  . Hyperlipemia   . Malignant hypertension   . Microcytic anemia 06/12/2012  . Obesity     Patient Active Problem List   Diagnosis Date Noted  . Mixed hyperlipidemia 08/13/2020  . Acute gout of right knee 08/03/2019  . Orthostasis   . Muscle strain of right wrist   . Pain   . Physical debility 07/16/2019  . ESRD on dialysis ([place])   . Anemia of chronic disease   . Uncontrolled type 2 diabetes mellitus with hyperglycemia ([place])   . Congestive heart failure ([place])   . Volume overload 07/05/2019  . Anasarca 07/03/2019  .  Dyslipidemia 07/03/2019  . Chronic combined systolic and diastolic CHF (congestive heart failure) ([place]) 07/03/2019  . Bilateral leg edema   . CKD (chronic kidney disease) stage 5, GFR less than 15 ml/min (H[facility]) 06/22/2019  . Uncontrolled type 2 diabetes mellitus with hyperglycemia, with long-term current use of insulin ([place]) 06/22/2019  . Hypokalemia   . Thromboembolism ([place]) 02/20/2019  . AKI (acute kidney injury) ([place]) 02/20/2019  . Acute on chronic diastolic CHF (congestive heart failure) ([place]) 03/26/2018  . Acute respiratory failure with hypoxia ([place]) 03/26/2018  . Uncontrolled type 2 diabetes mellitus with stage 5 chronic kidney disease ([place]) 06/12/2012  . Hyponatremia 06/12/2012  . Microcytic anemia 06/12/2012  . Essential hypertension, benign 06/11/2012  . Elevated LFTs 06/11/2012  . Morbid obesity ([place]) 06/11/2012  . Hepatic steatosis 06/11/2012    Past Surgical History:  Procedure Laterality Date  . AV FISTULA PLACEMENT Left 11/11/2019   Procedure: ARTERIOVENOUS (AV) FISTULA CREATION LEFT BRACHIOCEPHALIC ARM;  Surgeon: Serafina Mitchell, MD;  Location: [facility];  Service: Vascular;  Laterality: Left;  . IR FLUORO GUIDE CV LINE RIGHT  07/08/2019  . IR REMOVAL TUN CV CATH W/O FL  03/23/2020  . IR US GUIDE VASC ACCESS RIGHT  07/08/2019  . NO PAST SURGERIES    . VASCULAR SURGERY         Family History  Problem Relation Age of Onset  . Gout Mother   .  Asthma Mother   . Diabetes Father   . Heart failure Father   . Diabetes Sister   . Hypertension Brother   . Pancreatic cancer Brother   . Diabetes Sister     Social History   Tobacco Use  . Smoking status: Never Smoker  . Smokeless tobacco: Never Used  Vaping Use  . Vaping Use: Never used  Substance Use Topics  . Alcohol use: Not Currently    Comment: haven't drank in over 5 years   . Drug use: No    Home Medications Prior to Admission medications   Medication Sig Start Date End Date Taking? Authorizing Provider   acetaminophen (TYLENOL) 500 MG tablet Take 1,000 mg by mouth every 6 (six) hours as needed for moderate pain or headache.    [provider]  albuterol (VENTOLIN HFA) 108 (90 Base) MCG/ACT inhaler Inhale 2 puffs into the lungs every 4 (four) hours as needed for wheezing or shortness of breath.  03/24/19   [provider]  allopurinol (ZYLOPRIM) 100 MG tablet Take 2 tablets (200 mg total) by mouth daily. 08/04/19   Love, Ivan Anchors, PA-C  amLODipine (NORVASC) 5 MG tablet Take 1 tablet (5 mg total) by mouth daily. 08/03/19   Love, Ivan Anchors, PA-C  aspirin EC 81 MG EC tablet Take 1 tablet (81 mg total) by mouth daily. 08/04/19   Love, Ivan Anchors, PA-C  atorvastatin (LIPITOR) 80 MG tablet Take 1 tablet (80 mg total) by mouth daily. 12/05/19   Arnoldo Lenis, MD  Calcium Carbonate Antacid (CALCIUM CARBONATE, DOSED IN MG ELEMENTAL CALCIUM,) 1250 MG/5ML SUSP Take 5 mLs (500 mg of elemental calcium total) by mouth every 6 (six) hours as needed for indigestion. 07/16/19   Amin, Jeanella Flattery, MD  carvedilol (COREG) 25 MG tablet Take 1 tablet (25 mg total) by mouth 2 (two) times daily with a meal. 08/03/19   Love, Ivan Anchors, PA-C  cloNIDine (CATAPRES) 0.3 MG tablet Take 1 tablet (0.3 mg total) by mouth 3 (three) times daily. 08/03/19 08/22/20  Love, Ivan Anchors, PA-C  Continuous Blood Gluc Sensor (FREESTYLE LIBRE 14 DAY SENSOR) MISC Inject 1 each into the skin every 14 (fourteen) days. Use as directed. Patient not taking: Reported on 11/26/2020 08/22/20   Brita Romp, NP  diclofenac sodium (VOLTAREN) 1 % GEL Apply 2 g topically 4 (four) times daily. Patient taking differently: Apply 2 g topically 4 (four) times daily as needed (pain). 08/03/19   Love, Ivan Anchors, PA-C  ezetimibe (ZETIA) 10 MG tablet Take 10 mg by mouth every evening. 01/20/19   [provider]  fluticasone (FLONASE) 50 MCG/ACT nasal spray Place 1 spray into both nostrils daily. Patient taking differently: Place 1 spray into both  nostrils daily as needed for allergies. 04/04/18 08/22/20  Manuella Ghazi, Pratik D, DO  furosemide (LASIX) 80 MG tablet Take 80 mg by mouth daily. 10/11/19   [provider]  gabapentin (NEURONTIN) 100 MG capsule Take 1 capsule (100 mg total) by mouth 3 (three) times daily. 08/03/19   Love, Ivan Anchors, PA-C  glucose blood (ACCU-CHEK AVIVA PLUS) test strip Use as instructed TO CHECK BLOOD GLUCOSE THREE TIMES DAILY 08/15/20   Nida, Marella Chimes, MD  HYDROcodone-acetaminophen (NORCO/VICODIN) 5-325 MG tablet Take 1 tablet by mouth every 6 (six) hours as needed for severe pain. 11/11/19   Dagoberto Ligas, PA-C  insulin aspart (NOVOLOG FLEXPEN RELION) 100 UNIT/ML FlexPen Inject 8-14 Units into the skin 3 (three) times daily with meals. 08/13/20  Cassandria Anger, MD  insulin detemir (LEVEMIR FLEXTOUCH) 100 UNIT/ML FlexPen Inject 50 Units into the skin daily. 08/22/20   Brita Romp, NP  Insulin Pen Needle (B-D ULTRAFINE III SHORT PEN) 31G X 8 MM MISC 1 each by Does not apply route as directed. 08/13/20   Cassandria Anger, MD  linaclotide Rolan Lipa) 145 MCG CAPS capsule Take 1 capsule (145 mcg total) by mouth daily before breakfast. 04/05/18   Manuella Ghazi, Pratik D, DO  multivitamin (RENA-VIT) TABS tablet Take 1 tablet by mouth at bedtime. 08/03/19   Love, Ivan Anchors, PA-C  OZEMPIC, 0.25 OR 0.5 MG/DOSE, 2 MG/1.5ML SOPN Inject into the skin. 11/23/20   [provider]  sevelamer carbonate (RENVELA) 800 MG tablet Take 3 tablets (2,400 mg total) by mouth 3 (three) times daily with meals. 08/03/19   Love, Ivan Anchors, PA-C    Allergies    Lisinopril and Hydralazine  Review of Systems   Review of Systems  Constitutional: Negative for chills, fatigue and fever.  Respiratory: Negative for cough, shortness of breath and wheezing.   Cardiovascular: Negative for chest pain and palpitations.  Gastrointestinal: Negative for abdominal pain, blood in stool, diarrhea, nausea and vomiting.  Genitourinary:  Positive for decreased urine volume, difficulty urinating, flank pain and hematuria. Negative for dysuria, scrotal swelling, testicular pain and urgency.  Musculoskeletal: Negative for arthralgias, back pain, myalgias, neck pain and neck stiffness.  Skin: Negative for rash.  Neurological: Negative for dizziness, weakness, numbness and headaches.  Hematological: Does not bruise/bleed easily.  Psychiatric/Behavioral: Negative for confusion.    Physical Exam Updated Vital Signs BP (!) 134/94   Pulse 89   Temp 98.4 F (36.9 C) (Oral)   Resp 20   Ht '5\' 7"'$  (1.702 m)   Wt 136.1 kg   SpO2 98%   BMI 46.99 kg/m   Physical Exam Constitutional:      General: He is not in acute distress.    Appearance: Normal appearance.  Cardiovascular:     Rate and Rhythm: Normal rate and regular rhythm.     Pulses: Normal pulses.  Pulmonary:     Effort: Pulmonary effort is normal.     Breath sounds: Normal breath sounds.  Abdominal:     General: There is no distension.     Palpations: Abdomen is soft.     Tenderness: There is no abdominal tenderness. There is no right CVA tenderness or left CVA tenderness.  Musculoskeletal:     Cervical back: Normal range of motion.     Right lower leg: No edema.     Left lower leg: No edema.  Skin:    General: Skin is warm.  Neurological:     General: No focal deficit present.     Mental Status: He is alert.     Motor: No weakness.     ED Results / Procedures / Treatments   Labs (all labs ordered are listed, but only abnormal results are displayed) Labs Reviewed  URINALYSIS, ROUTINE W REFLEX MICROSCOPIC - Abnormal; Notable for the following components:      Result Value   Color, Urine RED (*)    APPearance TURBID (*)    Glucose, UA   (*)    Value: TEST NOT REPORTED DUE TO COLOR INTERFERENCE OF URINE PIGMENT   Hgb urine dipstick   (*)    Value: TEST NOT REPORTED DUE TO COLOR INTERFERENCE OF URINE PIGMENT   Bilirubin Urine   (*)    Value: TEST NOT  REPORTED DUE TO COLOR INTERFERENCE OF URINE PIGMENT   Ketones, ur   (*)    Value: TEST NOT REPORTED DUE TO COLOR INTERFERENCE OF URINE PIGMENT   Protein, ur   (*)    Value: TEST NOT REPORTED DUE TO COLOR INTERFERENCE OF URINE PIGMENT   Nitrite POSITIVE (*)    Leukocytes,Ua   (*)    Value: TEST NOT REPORTED DUE TO COLOR INTERFERENCE OF URINE PIGMENT   All other components within normal limits  CBC WITH DIFFERENTIAL/PLATELET - Abnormal; Notable for the following components:   RBC 3.93 (*)    Hemoglobin 11.3 (*)    HCT 35.8 (*)    All other components within normal limits  BASIC METABOLIC PANEL - Abnormal; Notable for the following components:   Chloride 97 (*)    Glucose, Bld 137 (*)    BUN 29 (*)    Creatinine, Ser 5.78 (*)    GFR, Estimated 11 (*)    All other components within normal limits  URINALYSIS, MICROSCOPIC (REFLEX) - Abnormal; Notable for the following components:   Bacteria, UA FEW (*)    All other components within normal limits  URINE CULTURE    EKG None  Radiology CT Renal Stone Study  Result Date: 12/15/2020 CLINICAL DATA:  RIGHT flank pain and hematuria suspected kidney stone, symptoms began after dialysis today. History hypertension, diabetes mellitus, chronic kidney disease, COPD EXAM: CT ABDOMEN AND PELVIS WITHOUT CONTRAST TECHNIQUE: Multidetector CT imaging of the abdomen and pelvis was performed following the standard protocol without IV contrast. COMPARISON:  None FINDINGS: Lower chest: Minimal dependent atelectasis LEFT lung base. Minimal pericardial effusion. Hepatobiliary: Gallbladder and liver normal appearance Pancreas: Normal appearance Spleen: Normal appearance Adrenals/Urinary Tract: Adrenal glands normal appearance. Atrophic kidneys without mass or hydronephrosis. Bladder wall thickening, though bladder is decompressed, potentially artifact. No ureteral calcification or dilatation. Stomach/Bowel: Normal appendix. Mild sigmoid diverticulosis without  evidence of diverticulitis. Slightly increased stool in proximal half of colon. Stomach and bowel loops otherwise normal appearance. Vascular/Lymphatic: Atherosclerotic calcifications aorta, iliac arteries, and coronary arteries. Aorta normal caliber without aneurysm. No adenopathy. Upper normal sized inguinal nodes bilaterally. Reproductive: Unremarkable prostate gland and seminal vesicles Other: Periumbilical hernia containing fat. Infiltrate changes in subcutaneous soft tissues of the anterior abdominal wall in the mid abdomen with associated skin thickening question medication injection sites versus cellulitis. No free air or free fluid. Musculoskeletal: No acute osseous findings. IMPRESSION: Atrophic kidneys without mass or hydronephrosis. Mild sigmoid diverticulosis without evidence of diverticulitis. Minimal pericardial effusion. Periumbilical hernia containing fat. Infiltrate changes in subcutaneous soft tissues of the anterior abdominal wall in the mid abdomen with associated skin thickening question medication injection sites versus cellulitis. Aortic Atherosclerosis (ICD10-I70.0). Electronically Signed   By: Lavonia Dana M.D.   On: 12/15/2020 15:40    Procedures Procedures   Medications Ordered in ED Medications  oxyCODONE-acetaminophen (PERCOCET/ROXICET) 5-325 MG per tablet 1 tablet (has no administration in time range)    ED Course  I have reviewed the triage vital signs and the nursing notes.  Pertinent labs & imaging results that were available during my care of the patient were reviewed by me and considered in my medical decision making (see chart for details).    MDM Rules/Calculators/A&P                         Patient here with sudden onset right flank pain and hematuria during dialysis earlier today.  No history of kidney  stones.  Had a nearly complete dialysis treatment today but pt stopped it prematurely due to pain.  Patient spontaneously voided here, urine was very bloody.   Urinalysis shows nitrite positive with 21-50 WBC.  Urine culture pending.  No leukocytosis, kidney function baseline CT renal stone study without evidence of renal mass, calculi or hydronephrosis and vitals reassuring.  We will treat patient for UTI, he agrees to follow-up with urology, but prefers [facility] location.  Given IM rocephin here and rx for Keflex.    Post void bladder scan shows 64 mL  Appears appropriate for discharge home.  Strict return precautions were discussed.   Final Clinical Impression(s) / ED Diagnoses Final diagnoses:  Gross hematuria  Flank pain    Rx / DC Orders ED Discharge Orders    None       Kem Parkinson, PA-C 12/16/20 [place], [place], DO 12/17/20 [phone]

## 2020-12-15 NOTE — Discharge Instructions (Addendum)
Your CT scan today did not show evidence of a kidney stone.  Your urine test shows that you likely have a urinary tract infection.  It is important that you take the antibiotics as directed until they are finished.  Call the urology group listed on your paperwork on Monday to arrange a follow-up appointment.  Return to the emergency department if you develop any worsening symptoms such as increasing pain, fever or vomiting.

## 2020-12-18 LAB — URINE CULTURE

## 2021-02-06 IMAGING — US US RENAL
1 series · 14 of 25 positions shown · non-contrast
Comparison: None.

CLINICAL DATA: Acute renal failure

EXAM:
RENAL / URINARY TRACT ULTRASOUND COMPLETE

[Series 1: us renal · 0.25mm/px · 14 of 59 slices shown]
[im 1/59]
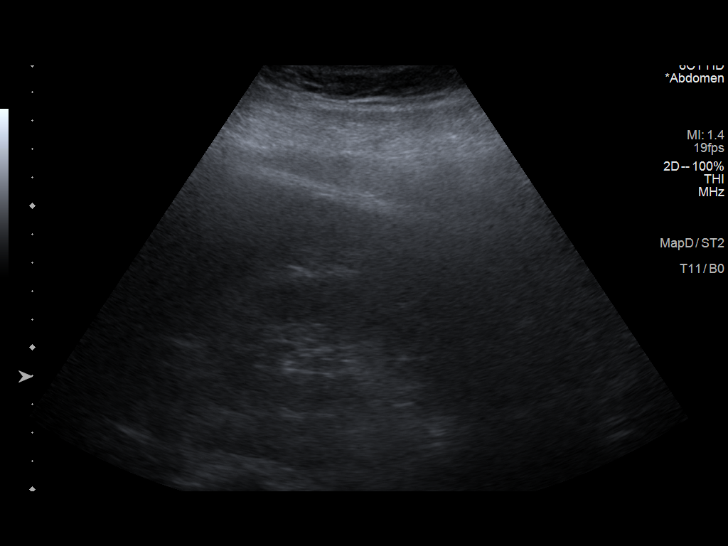
[im 5/59]
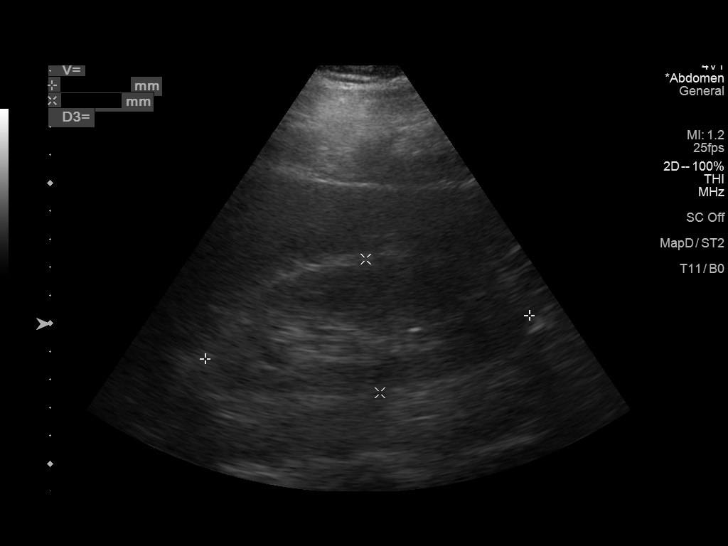
[im 10/59]
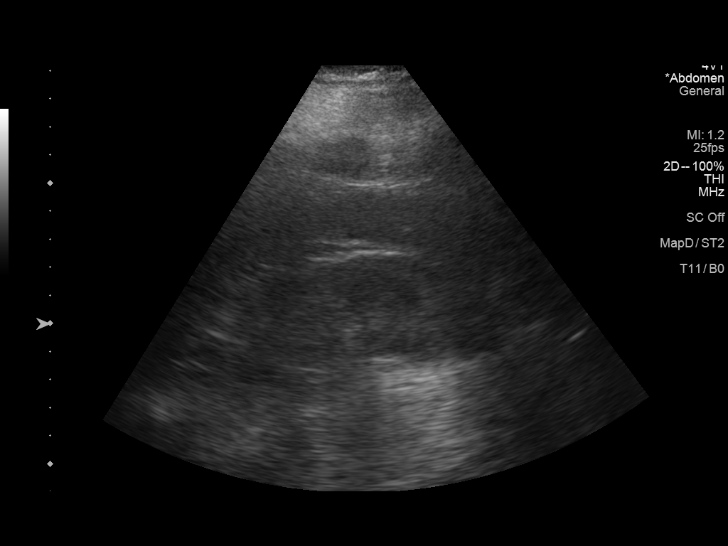
[im 15/59]
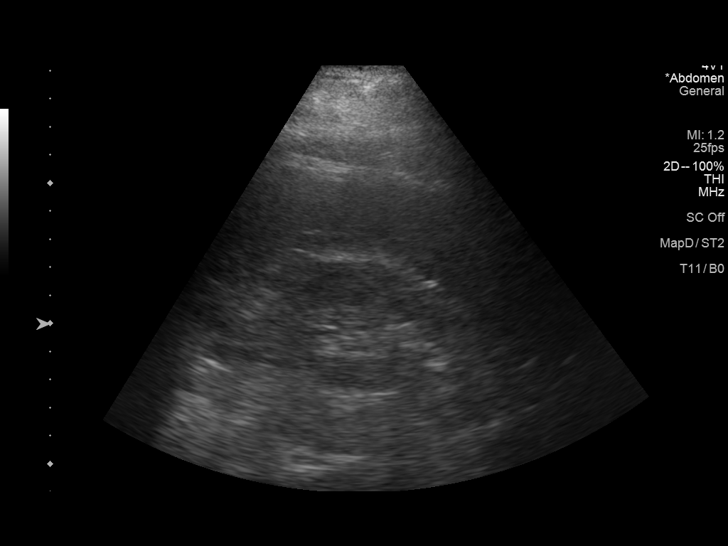
[im 20/59]
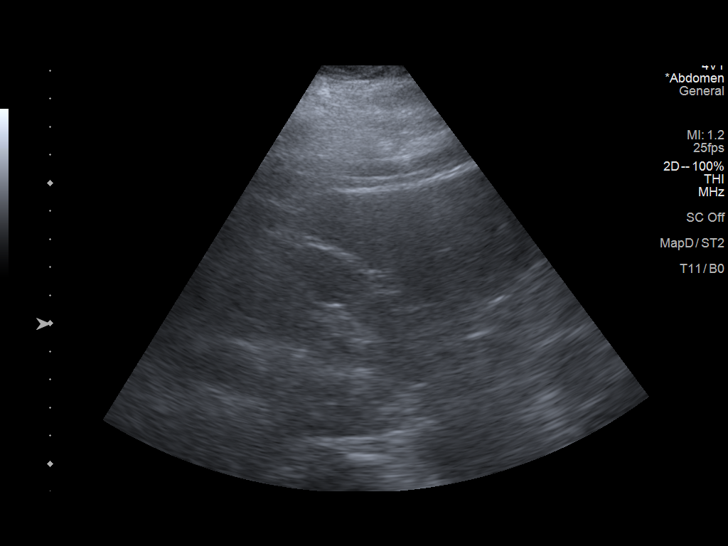
[im 22/59]
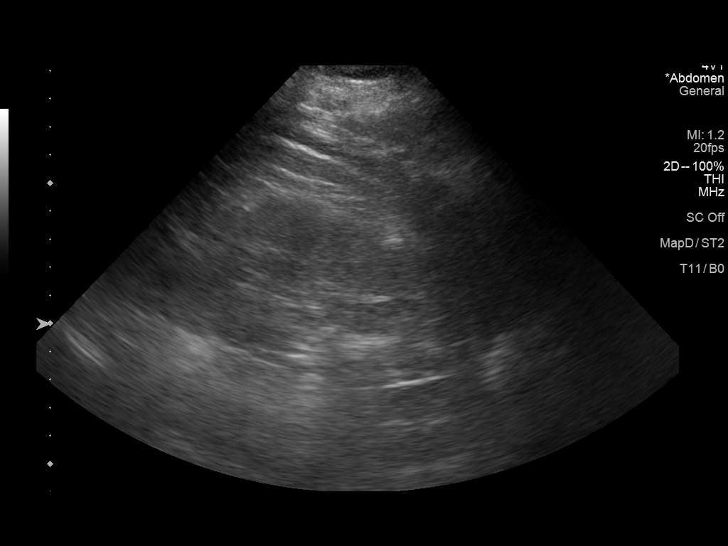
[im 27/59]
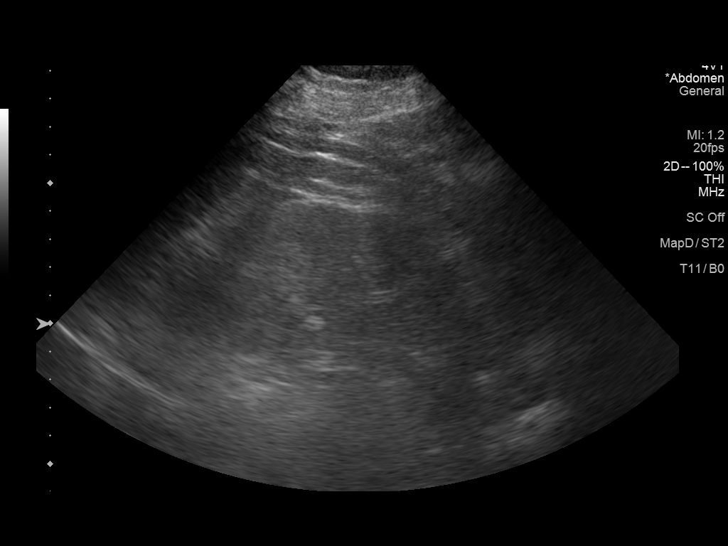
[im 32/59]
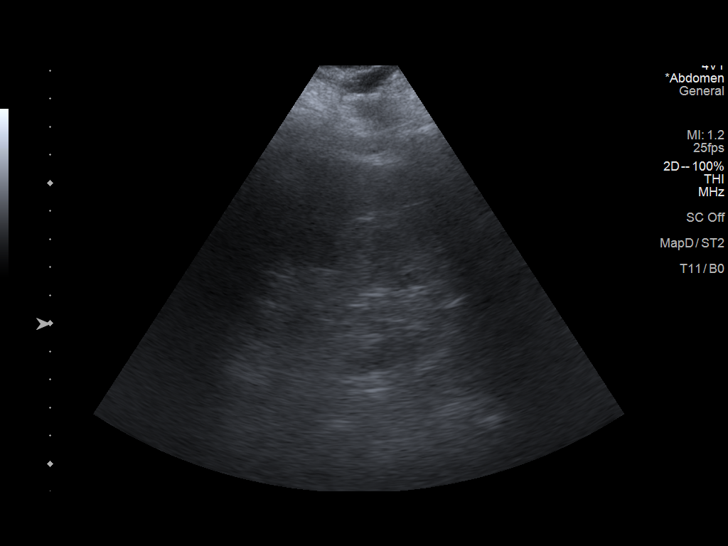
[im 37/59]
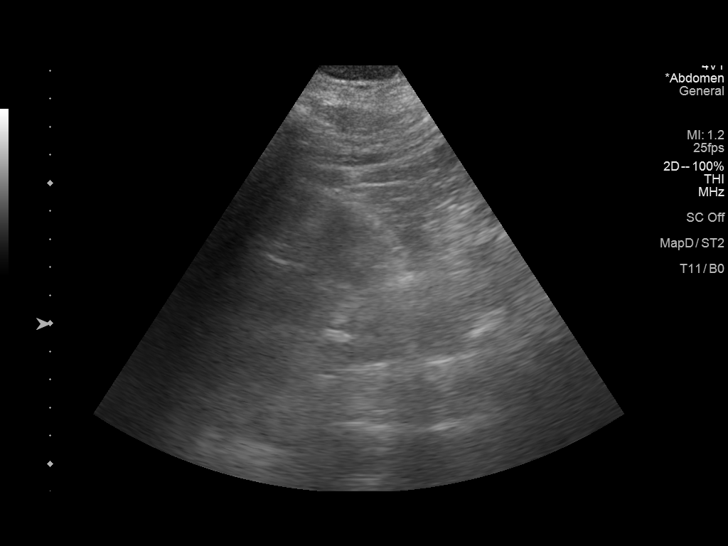
[im 39/59]
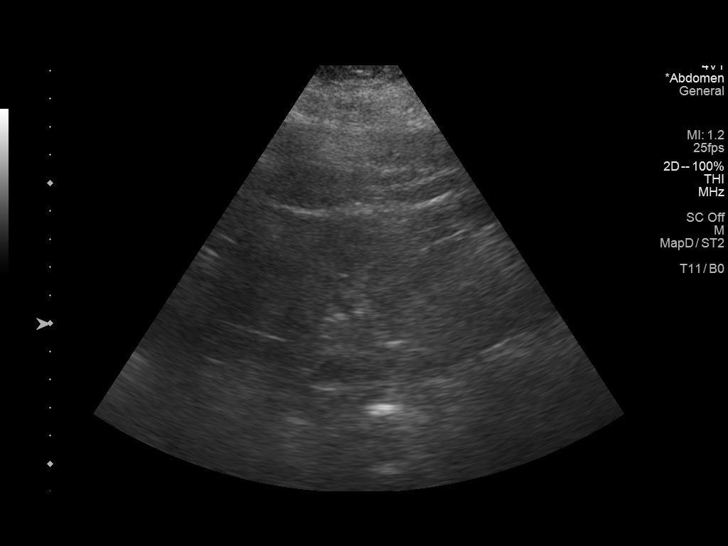
[im 44/59]
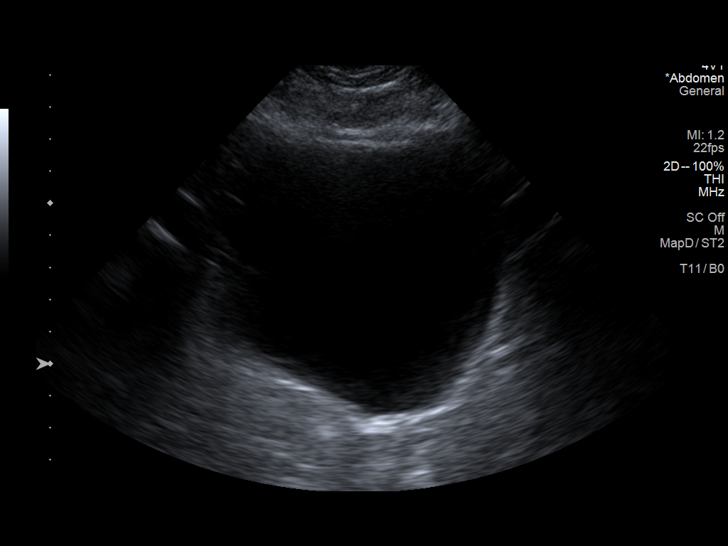
[im 49/59]
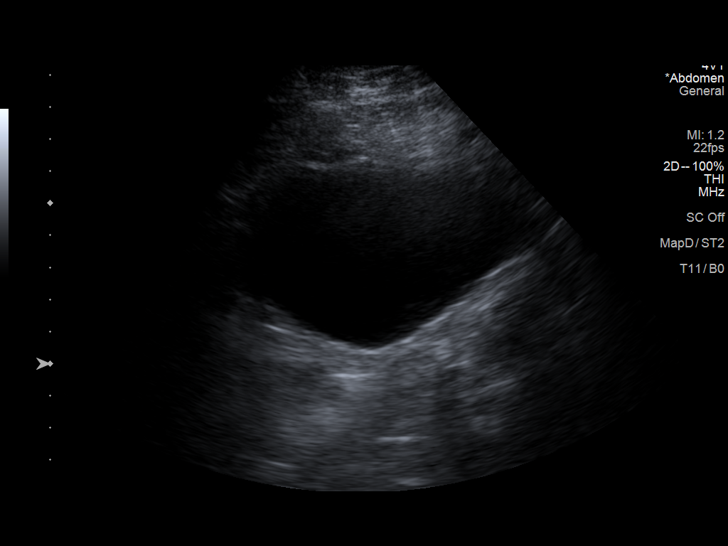
[im 54/59]
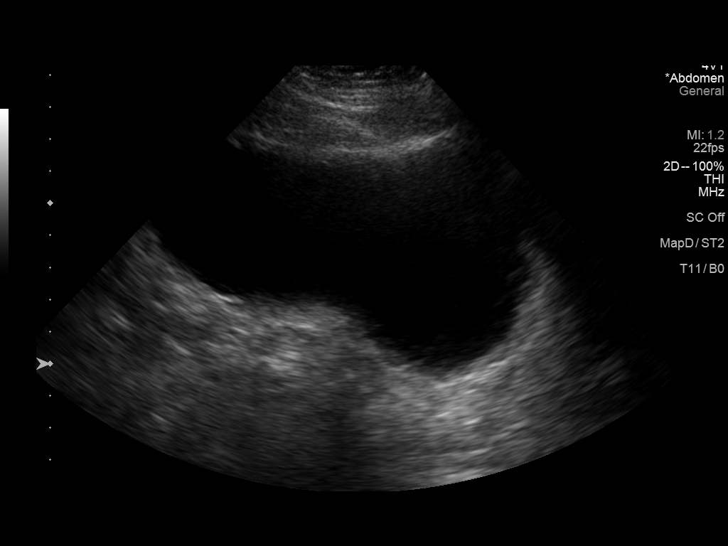
[im 59/59]
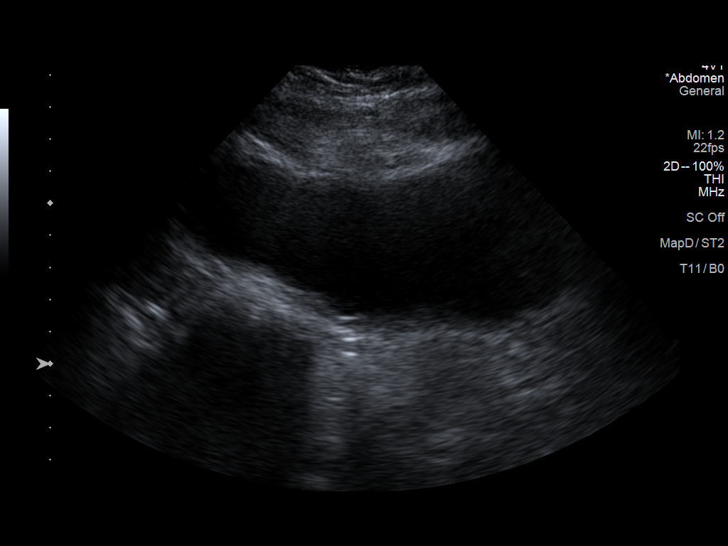

[14 of 25 positions shown; findings below may reference images not displayed]

FINDINGS: Right Kidney:

Renal measurements: 11.7 x 4.8 x 6.1 cm = volume: 177 mL .
Echogenicity within normal limits. No mass or hydronephrosis
visualized.

Left Kidney:

Renal measurements: 11.1 x 5.3 x 5.9 cm = volume: 181 mL.
Echogenicity within normal limits. No mass or hydronephrosis
visualized.

Bladder:

Appears normal for degree of bladder distention.
IMPRESSION: Normal bilateral renal ultrasound.

## 2021-02-16 IMAGING — DX DG CHEST 2V
2 series · 2 of 2 positions shown · non-contrast
Comparison: Chest radiograph 06/22/2019, 04/28/2019

CLINICAL DATA: History of end-stage renal disease and CHF.

EXAM:
CHEST - 2 VIEW

[chest pa]
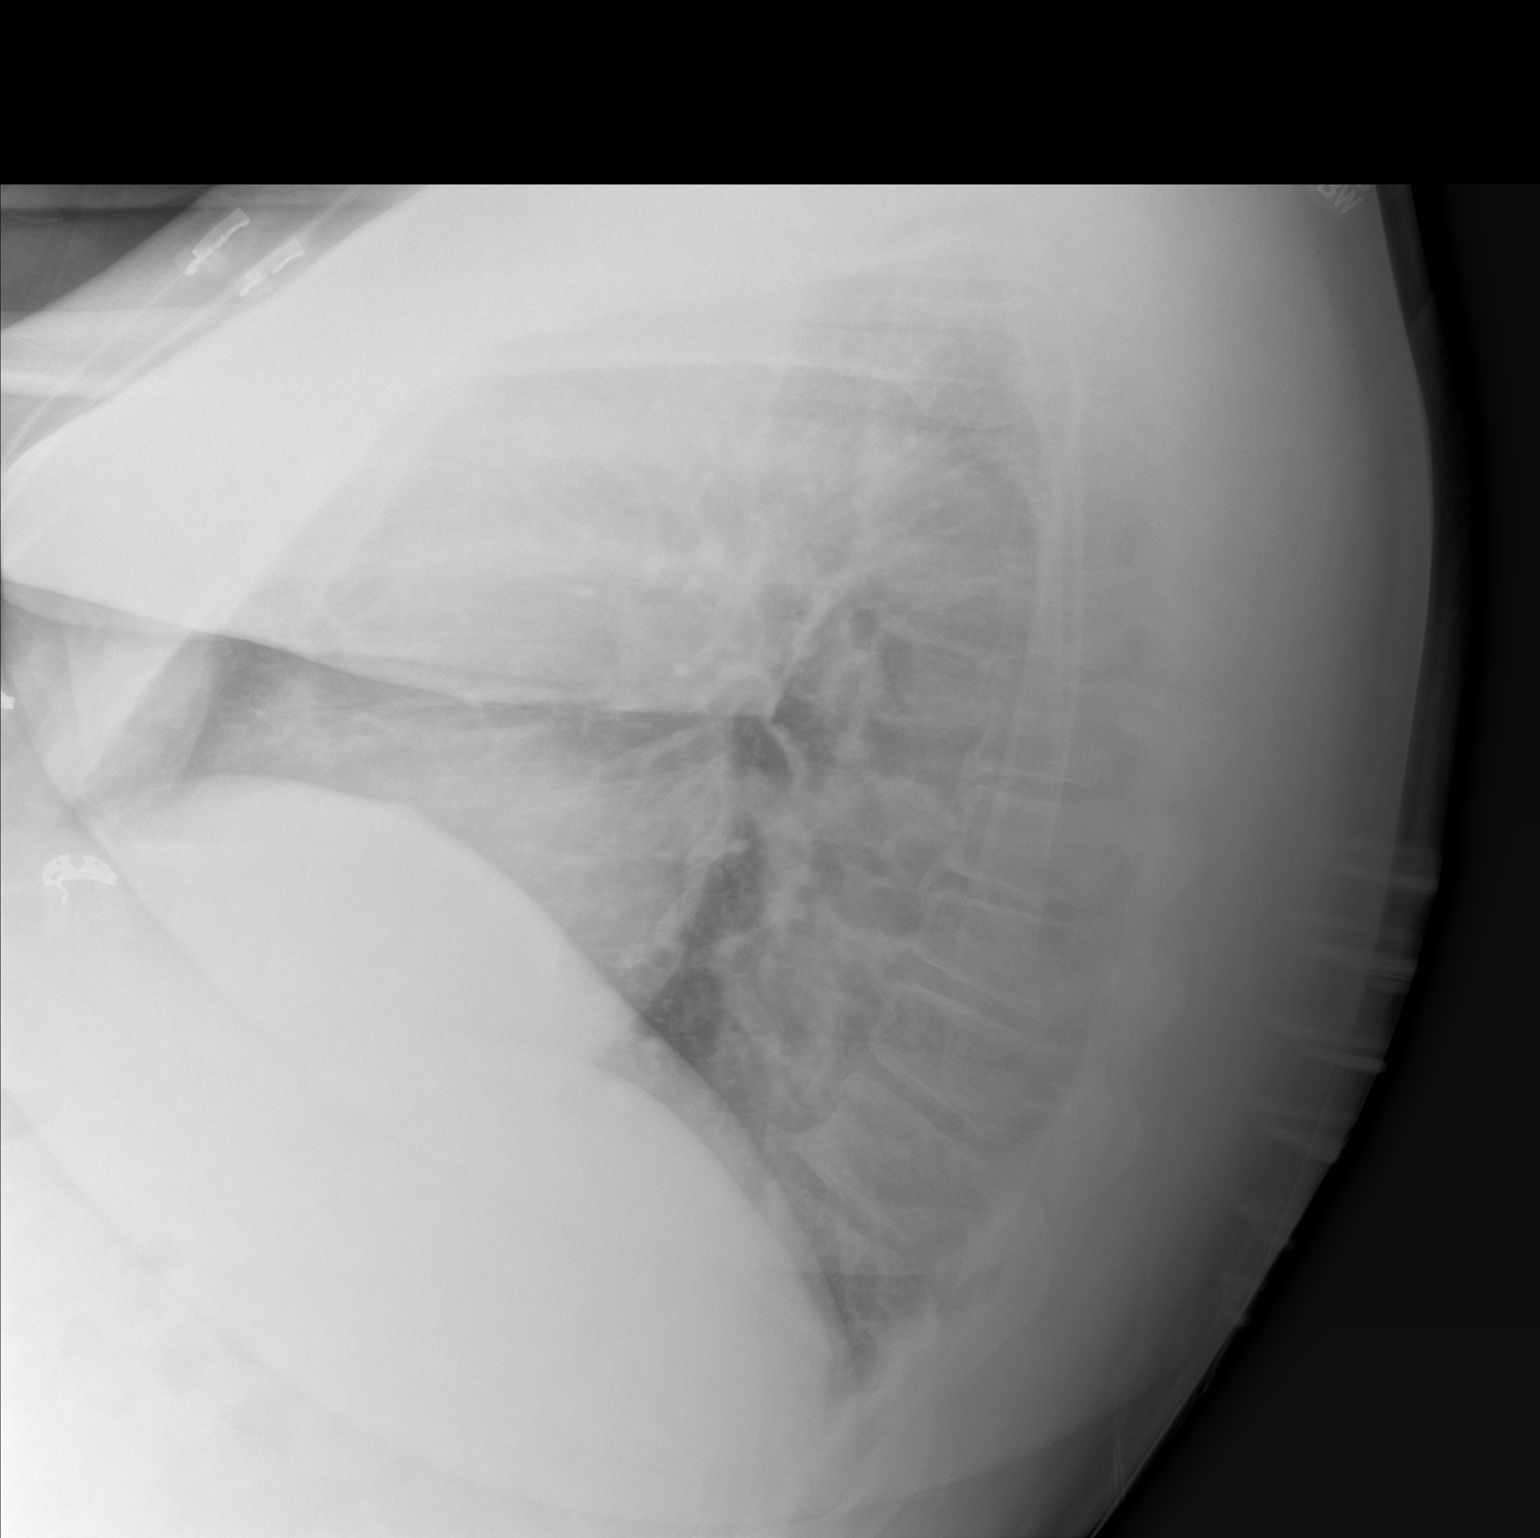

[chest ap]
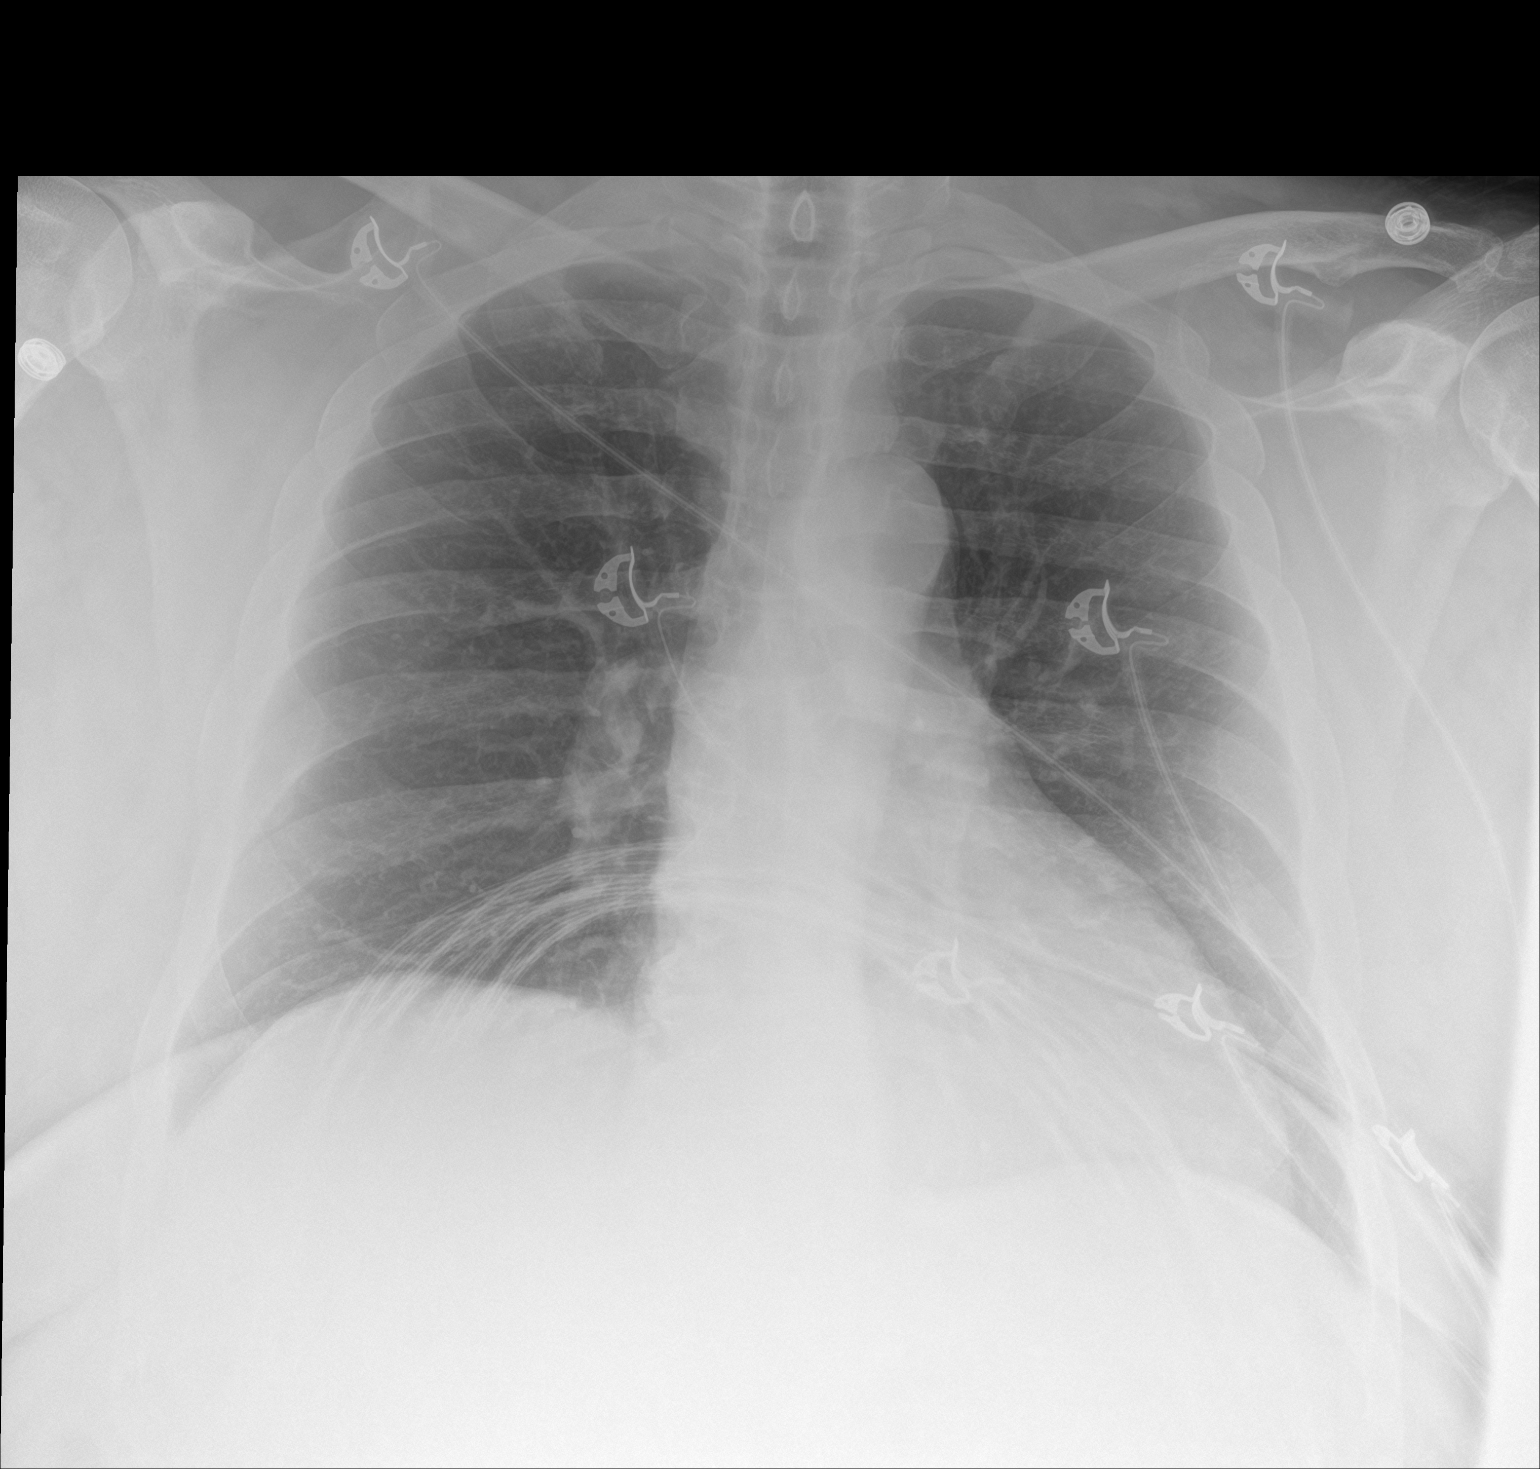

[2 of 2 positions shown; findings below may reference images not displayed]

FINDINGS: Stable cardiomediastinal contours with enlarged heart size. A few
minimal linear opacities at the right lung base likely reflect
atelectasis or scar. No new focal infiltrate or evidence of edema.
No pneumothorax or pleural effusion. No acute findings in the
visualized skeleton.
IMPRESSION: No evidence of active disease.

## 2021-02-20 IMAGING — DX DG HAND COMPLETE 3+V*L*
3 series · 3 of 3 positions shown · non-contrast
Comparison: None.

CLINICAL DATA: Pain and swelling, first and second metacarpals.

EXAM:
LEFT HAND - COMPLETE 3+ VIEW

[hand ap]
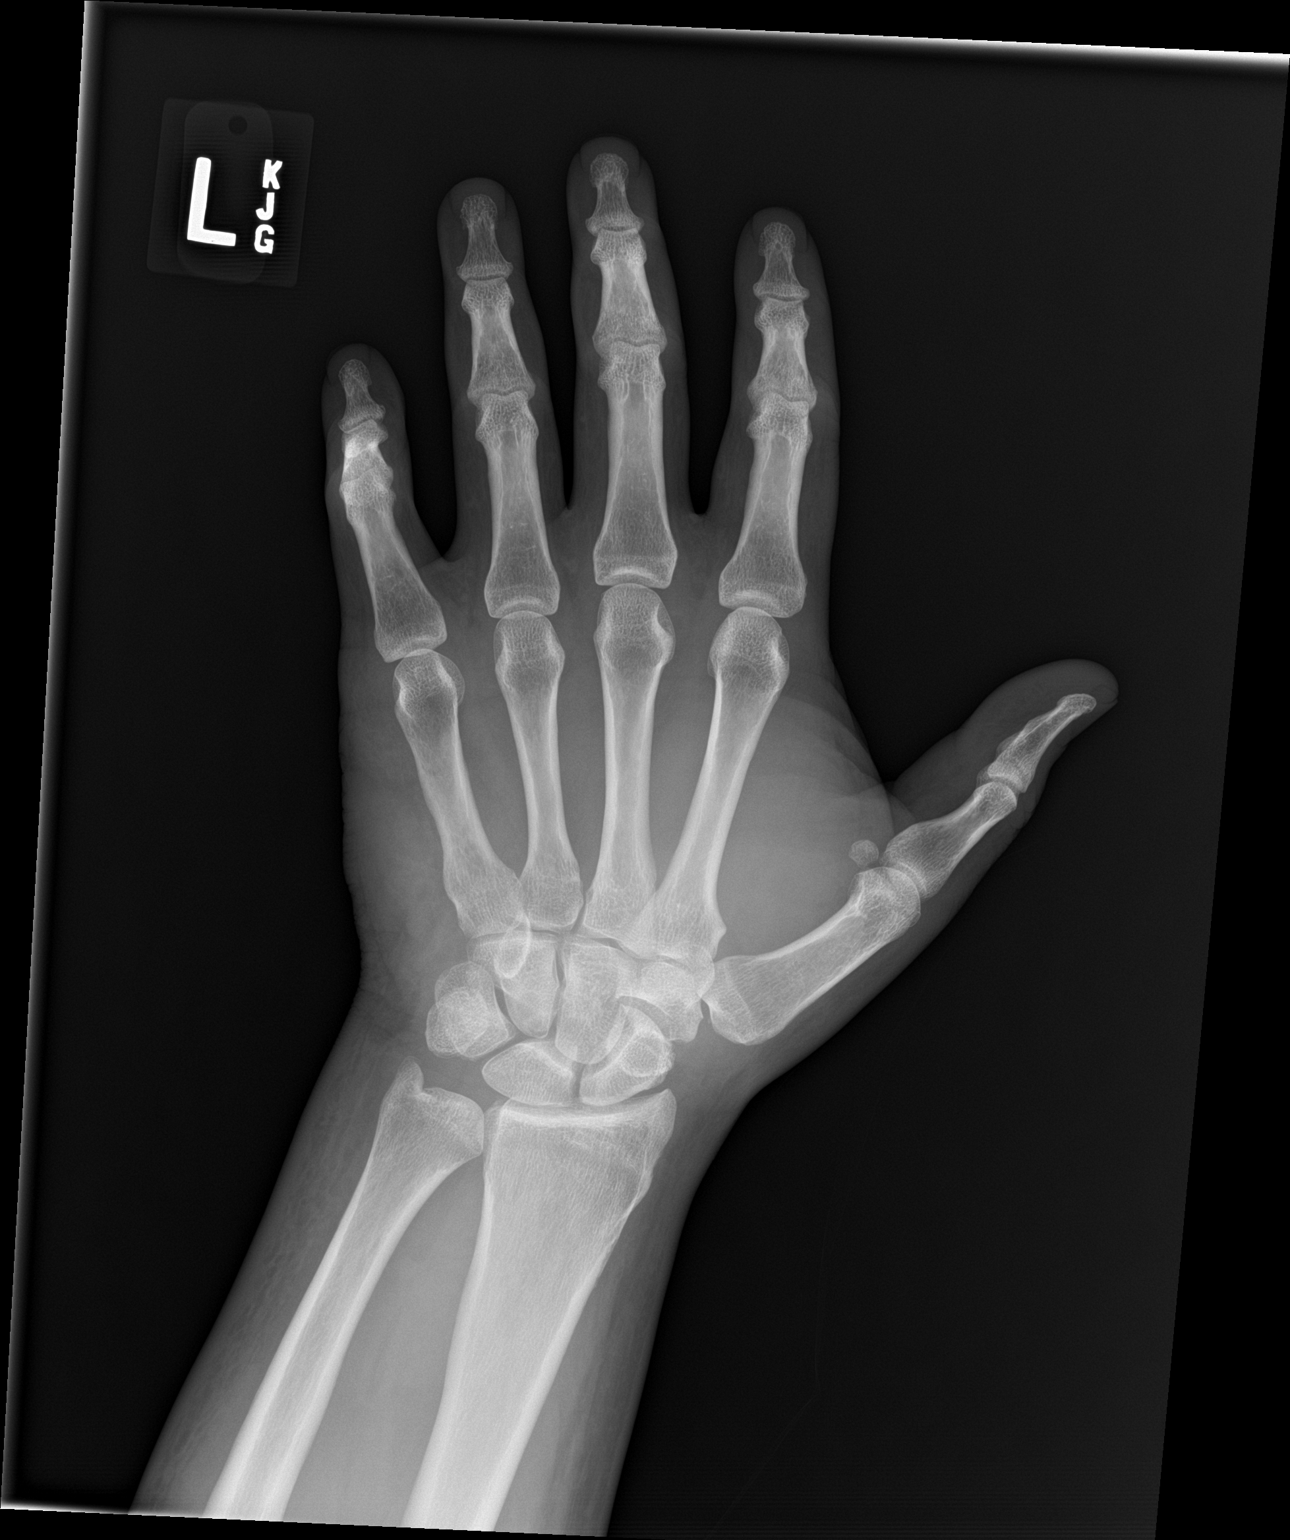

[hand obl]
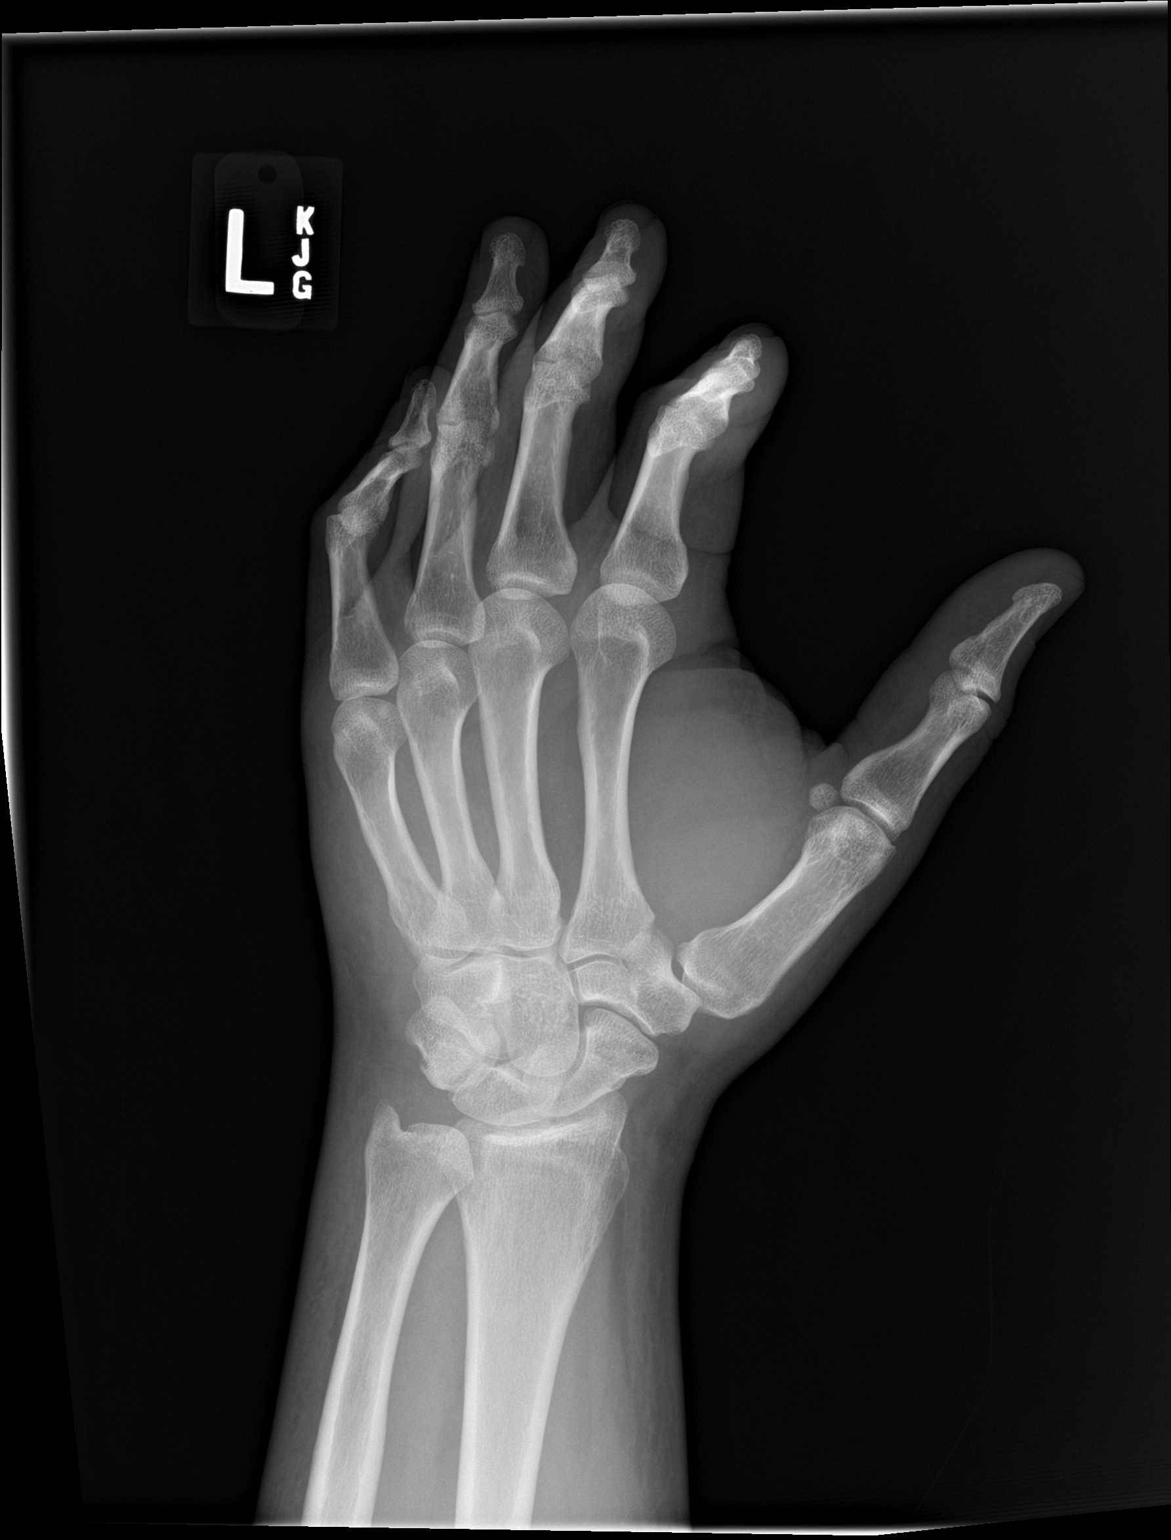

[hand lat]
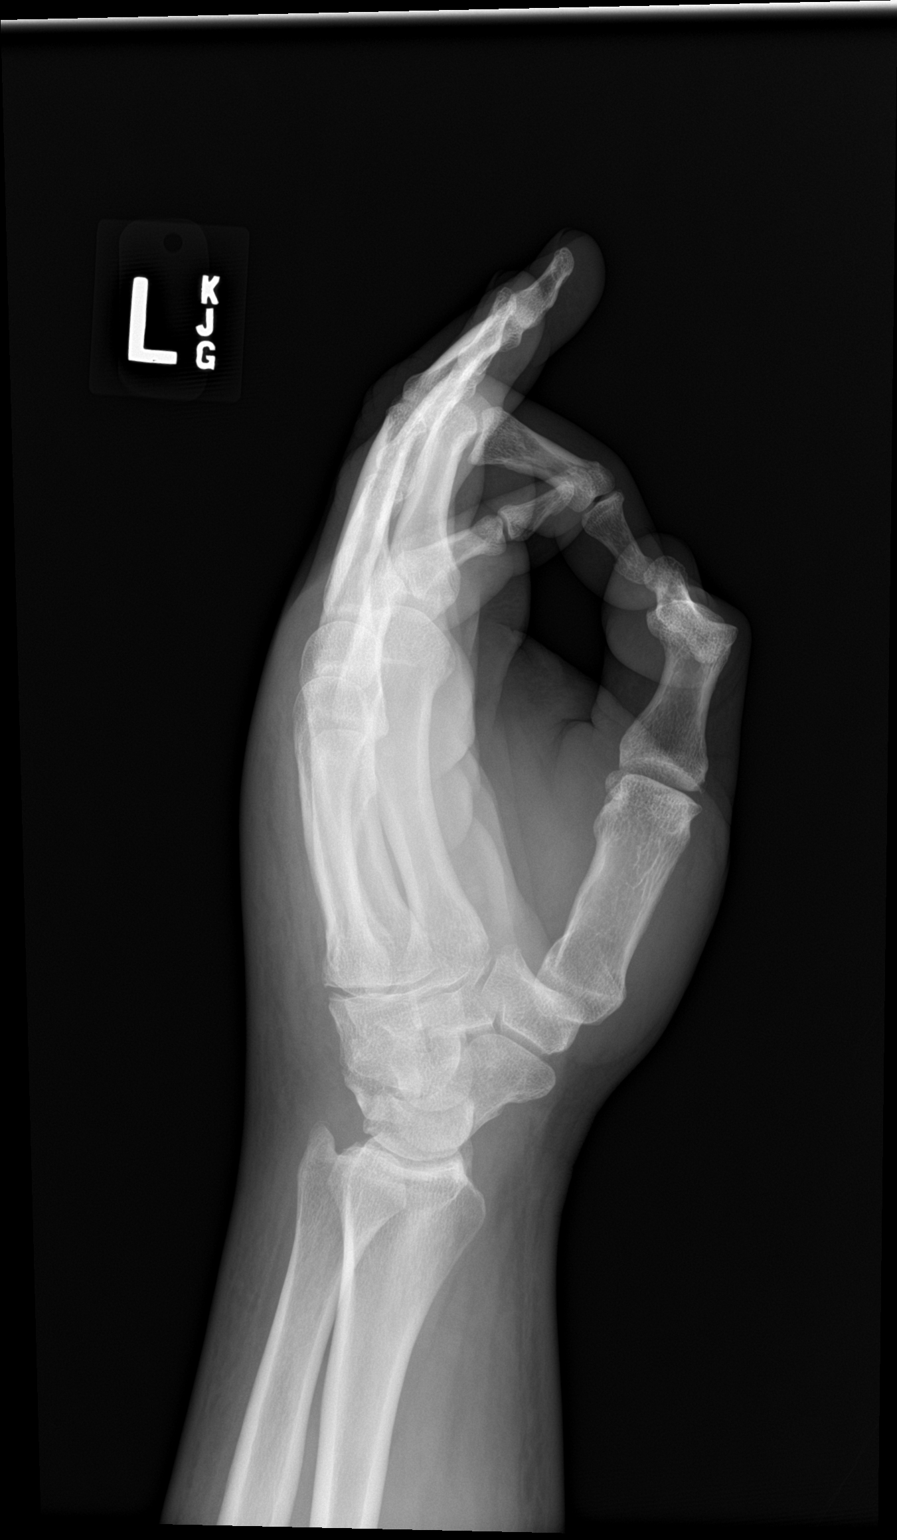

[3 of 3 positions shown; findings below may reference images not displayed]

FINDINGS: No fracture or dislocation of the left hand. Joint spaces are well
preserved. Diffuse soft tissue edema about the hand.
IMPRESSION: No fracture or dislocation of the left hand. Joint spaces are well
preserved. Diffuse soft tissue edema about the hand.

## 2021-02-25 ENCOUNTER — Other Ambulatory Visit: Payer: Self-pay

## 2021-02-25 ENCOUNTER — Ambulatory Visit (INDEPENDENT_AMBULATORY_CARE_PROVIDER_SITE_OTHER): Payer: Medicare Other | Admitting: Nurse Practitioner

## 2021-02-25 ENCOUNTER — Encounter: Payer: Self-pay | Admitting: Nurse Practitioner

## 2021-02-25 VITALS — BP 148/87 | HR 89 | Ht 67.0 in | Wt 312.0 lb

## 2021-02-25 DIAGNOSIS — E782 Mixed hyperlipidemia: Secondary | ICD-10-CM | POA: Diagnosis not present

## 2021-02-25 DIAGNOSIS — I10 Essential (primary) hypertension: Secondary | ICD-10-CM | POA: Diagnosis not present

## 2021-02-25 DIAGNOSIS — N185 Chronic kidney disease, stage 5: Secondary | ICD-10-CM

## 2021-02-25 DIAGNOSIS — E1122 Type 2 diabetes mellitus with diabetic chronic kidney disease: Secondary | ICD-10-CM | POA: Diagnosis not present

## 2021-02-25 DIAGNOSIS — E1165 Type 2 diabetes mellitus with hyperglycemia: Secondary | ICD-10-CM

## 2021-02-25 DIAGNOSIS — IMO0002 Reserved for concepts with insufficient information to code with codable children: Secondary | ICD-10-CM

## 2021-02-25 LAB — POCT GLYCOSYLATED HEMOGLOBIN (HGB A1C): Hemoglobin A1C: 8.1 % — AB (ref 4.0–5.6)

## 2021-02-25 NOTE — Progress Notes (Signed)
Endocrinology Follow Up Visit       02/25/2021, 1:28 PM   Subjective:    Patient ID: Kelly Key, male    DOB: Sep 09, 1971.  Kelly Key is being seen in follow up after being seen in consultation for management of currently uncontrolled symptomatic diabetes requested by  Kelly Gravel, MD.   Past Medical History:  Diagnosis Date  . Acute diastolic CHF (congestive heart failure) ([place]) 06/11/2012   EF 50-55% [facility])  . Chronic kidney disease   . COPD (chronic obstructive pulmonary disease) ([place])   . Diabetes mellitus    A1c 11.5 (06/11/2012).  Kelly Key Dyspnea   . Gout   . Hepatic steatosis 06/11/2012   Elevated LFTs  . Hyperlipemia   . Malignant hypertension   . Microcytic anemia 06/12/2012  . Obesity     Past Surgical History:  Procedure Laterality Date  . AV FISTULA PLACEMENT Left 11/11/2019   Procedure: ARTERIOVENOUS (AV) FISTULA CREATION LEFT BRACHIOCEPHALIC ARM;  Surgeon: Kelly Mitchell, MD;  Location: [place];  Service: Vascular;  Laterality: Left;  . IR FLUORO GUIDE CV LINE RIGHT  07/08/2019  . IR REMOVAL TUN CV CATH W/O FL  03/23/2020  . IR US GUIDE VASC ACCESS RIGHT  07/08/2019  . NO PAST SURGERIES    . VASCULAR SURGERY      Social History   Socioeconomic History  . Marital status: Single    Spouse name: Not on file  . Number of children: Not on file  . Years of education: Not on file  . Highest education level: Not on file  Occupational History  . Occupation: retired  Tobacco Use  . Smoking status: Never Smoker  . Smokeless tobacco: Never Used  Vaping Use  . Vaping Use: Never used  Substance and Sexual Activity  . Alcohol use: Not Currently    Comment: haven't drank in over 5 years   . Drug use: No  . Sexual activity: Not Currently  Other Topics Concern  . Not on file  Social History Narrative  . Not on file   Social Determinants of Health   Financial Resource Strain: Not on  file  Food Insecurity: Not on file  Transportation Needs: Not on file  Physical Activity: Not on file  Stress: Not on file  Social Connections: Not on file    Family History  Problem Relation Age of Onset  . Gout Mother   . Asthma Mother   . Diabetes Father   . Heart failure Father   . Diabetes Sister   . Hypertension Brother   . Pancreatic cancer Brother   . Diabetes Sister     Outpatient Encounter Medications as of 02/25/2021  Medication Sig  . acetaminophen (TYLENOL) 500 MG tablet Take 1,000 mg by mouth every 6 (six) hours as needed for moderate pain or headache.  . albuterol (VENTOLIN HFA) 108 (90 Base) MCG/ACT inhaler Inhale 2 puffs into the lungs every 4 (four) hours as needed for wheezing or shortness of breath.   . allopurinol (ZYLOPRIM) 100 MG tablet Take 2 tablets (200 mg total) by mouth daily.  Kelly Key amLODipine (NORVASC) 5 MG tablet Take 1 tablet (5 mg total)  by mouth daily.  Kelly Key aspirin EC 81 MG EC tablet Take 1 tablet (81 mg total) by mouth daily.  Kelly Key atorvastatin (LIPITOR) 80 MG tablet Take 1 tablet (80 mg total) by mouth daily.  . Calcium Carbonate Antacid (CALCIUM CARBONATE, DOSED IN MG ELEMENTAL CALCIUM,) 1250 MG/5ML SUSP Take 5 mLs (500 mg of elemental calcium total) by mouth every 6 (six) hours as needed for indigestion.  . carvedilol (COREG) 25 MG tablet Take 1 tablet (25 mg total) by mouth 2 (two) times daily with a meal.  . cephALEXin (KEFLEX) 500 MG capsule Take 1 capsule (500 mg total) by mouth 4 (four) times daily.  . cloNIDine (CATAPRES) 0.3 MG tablet Take 1 tablet (0.3 mg total) by mouth 3 (three) times daily.  . diclofenac sodium (VOLTAREN) 1 % GEL Apply 2 g topically 4 (four) times daily. (Patient taking differently: Apply 2 g topically 4 (four) times daily as needed (pain).)  . ezetimibe (ZETIA) 10 MG tablet Take 10 mg by mouth every evening.  . fluticasone (FLONASE) 50 MCG/ACT nasal spray Place 1 spray into both nostrils daily. (Patient taking differently:  Place 1 spray into both nostrils daily as needed for allergies.)  . furosemide (LASIX) 80 MG tablet Take 80 mg by mouth daily.  Kelly Key gabapentin (NEURONTIN) 100 MG capsule Take 1 capsule (100 mg total) by mouth 3 (three) times daily.  Kelly Key glucose blood (ACCU-CHEK AVIVA PLUS) test strip Use as instructed TO CHECK BLOOD GLUCOSE THREE TIMES DAILY  . HYDROcodone-acetaminophen (NORCO/VICODIN) 5-325 MG tablet Take 1 tablet by mouth every 6 (six) hours as needed for severe pain.  Kelly Key insulin aspart (NOVOLOG FLEXPEN RELION) 100 UNIT/ML FlexPen Inject 8-14 Units into the skin 3 (three) times daily with meals.  . insulin detemir (LEVEMIR FLEXTOUCH) 100 UNIT/ML FlexPen Inject 50 Units into the skin daily.  . Insulin Pen Needle (B-D ULTRAFINE III SHORT PEN) 31G X 8 MM MISC 1 each by Does not apply route as directed.  . linaclotide (LINZESS) 145 MCG CAPS capsule Take 1 capsule (145 mcg total) by mouth daily before breakfast.  . multivitamin (RENA-VIT) TABS tablet Take 1 tablet by mouth at bedtime.  Kelly Key OZEMPIC, 0.25 OR 0.5 MG/DOSE, 2 MG/1.5ML SOPN Inject 0.25 mg into the skin once a week.  . sevelamer carbonate (RENVELA) 800 MG tablet Take 3 tablets (2,400 mg total) by mouth 3 (three) times daily with meals.  . [DISCONTINUED] Continuous Blood Gluc Sensor (FREESTYLE LIBRE 14 DAY SENSOR) MISC Inject 1 each into the skin every 14 (fourteen) days. Use as directed. (Patient not taking: No sig reported)   No facility-administered encounter medications on file as of 02/25/2021.    ALLERGIES: Allergies  Allergen Reactions  . Lisinopril Swelling  . Hydralazine Anxiety    Hallucinations     VACCINATION STATUS:  There is no immunization history on file for this patient.  Diabetes He presents for his follow-up diabetic visit. He has type 2 diabetes mellitus. Onset time: He was diagnosed at approximate age of 50 years. His disease course has been improving. Hypoglycemia symptoms include nervousness/anxiousness and sweats.  Pertinent negatives for hypoglycemia include no confusion, headaches, pallor or seizures. (Reports he starts getting symptoms of hypoglycemia when he drops below 100.) Associated symptoms include blurred vision and foot paresthesias. Pertinent negatives for diabetes include no chest pain, no fatigue, no polydipsia, no polyphagia, no polyuria and no weakness. There are no hypoglycemic complications. Symptoms are improving. Diabetic complications include heart disease, nephropathy and peripheral neuropathy. Risk factors for  coronary artery disease include dyslipidemia, diabetes mellitus, family history, male sex, obesity, hypertension and sedentary lifestyle. Current diabetic treatment includes intensive insulin program. He is compliant with treatment most of the time. His weight is fluctuating dramatically. He is following a generally unhealthy diet. When asked about meal planning, he reported none. He has had a previous visit with a dietitian. He rarely participates in exercise. (He presents today with no meter or logs to review (says he was in a rush to leave the house and forgot them).  His POCT A1c today is 8.1%, improving from last visit of 10.2%.  He is still awaiting a kidney transplant. He denies any recent episodes of hypoglycemia. ) An ACE inhibitor/angiotensin II receptor blocker is not being taken. He does not see a podiatrist.Eye exam is current.  Hyperlipidemia This is a chronic problem. The current episode started more than 1 year ago. The problem is controlled. Recent lipid tests were reviewed and are normal. Exacerbating diseases include chronic renal disease, diabetes and obesity. Factors aggravating his hyperlipidemia include beta blockers. Pertinent negatives include no chest pain, myalgias or shortness of breath. Current antihyperlipidemic treatment includes statins and ezetimibe. The current treatment provides mild improvement of lipids. Compliance problems include adherence to diet and  adherence to exercise.  Risk factors for coronary artery disease include dyslipidemia, diabetes mellitus, family history, hypertension, male sex, obesity and a sedentary lifestyle.  Hypertension This is a chronic problem. The current episode started more than 1 year ago. The problem has been gradually improving since onset. The problem is uncontrolled. Associated symptoms include blurred vision and sweats. Pertinent negatives include no chest pain, headaches, neck pain, palpitations or shortness of breath. There are no associated agents to hypertension. Risk factors for coronary artery disease include dyslipidemia, diabetes mellitus, male gender, obesity, sedentary lifestyle and family history. Past treatments include central alpha agonists, calcium channel blockers and diuretics. The current treatment provides mild improvement. Compliance problems include diet and exercise.  Hypertensive end-organ damage includes kidney disease and heart failure. He has ESRD on dialysis.. Identifiable causes of hypertension include chronic renal disease.    Review of systems  Constitutional: + Minimally fluctuating body weight,  current Body mass index is 48.87 kg/m. , no fatigue, no subjective hyperthermia, no subjective hypothermia Eyes: no blurry vision, no xerophthalmia ENT: no sore throat, no nodules palpated in throat, no dysphagia/odynophagia, no hoarseness Cardiovascular: no chest pain, no shortness of breath, no palpitations, no leg swelling Respiratory: no cough, no shortness of breath Gastrointestinal: no nausea/vomiting/diarrhea Musculoskeletal: no muscle/joint aches Skin: no rashes, no hyperemia Neurological: no tremors, no numbness, no tingling, no dizziness Psychiatric: no depression, no anxiety  Objective:    BP (!) 148/87   Pulse 89   Ht '5\' 7"'$  (1.702 m)   Wt (!) 312 lb (141.5 kg)   BMI 48.87 kg/m   Wt Readings from Last 3 Encounters:  02/25/21 (!) 312 lb (141.5 kg)  12/15/20 300 lb  (136.1 kg)  12/10/20 (!) 313 lb 2 oz (142 kg)    BP Readings from Last 3 Encounters:  02/25/21 (!) 148/87  12/15/20 132/61  12/10/20 (!) 174/94    Physical Exam- Limited  Constitutional:  Body mass index is 48.87 kg/m. , not in acute distress, normal state of mind Eyes:  EOMI, no exophthalmos Neck: Supple Cardiovascular: RRR, no murmers, rubs, or gallops, no edema, AV fistula in RUE + bruit and thrill Respiratory: Adequate breathing efforts, no crackles, rales, rhonchi, or wheezing Musculoskeletal: no gross deformities, strength  intact in all four extremities, no gross restriction of joint movements Skin:  no rashes, no hyperemia Neurological: no tremor with outstretched hands     CMP ( most recent) CMP     Component Value Date/Time   NA 137 12/15/2020 1436   K 4.2 12/15/2020 1436   CL 97 (L) 12/15/2020 1436   CO2 30 12/15/2020 1436   GLUCOSE 137 (H) 12/15/2020 1436   BUN 29 (H) 12/15/2020 1436   BUN 60 (A) 08/02/2020 0000   CREATININE 5.78 (H) 12/15/2020 1436   CALCIUM 9.1 12/15/2020 1436   PROT 6.9 06/22/2019 1052   ALBUMIN 2.4 (L) 08/02/2019 1318   AST 21 06/22/2019 1052   ALT 21 06/22/2019 1052   ALKPHOS 107 06/22/2019 1052   BILITOT 0.3 06/22/2019 1052   GFRNONAA 11 (L) 12/15/2020 1436   GFRAA 12 (L) 08/02/2019 1318     Diabetic Labs (most recent): Lab Results  Component Value Date   HGBA1C 8.1 (A) 02/25/2021   HGBA1C 10.2 07/24/2020   HGBA1C 12.1 (H) 06/23/2019     Lipid Panel ( most recent) Lipid Panel     Component Value Date/Time   CHOL 115 07/24/2020 0000   TRIG 168 (A) 07/24/2020 0000   HDL 34 (A) 07/24/2020 0000   LDLCALC 47 07/24/2020 0000      Lab Results  Component Value Date   TSH 2.827 06/11/2012   FREET4 1.35 06/11/2012           Assessment & Plan:   1) Uncontrolled type 2 diabetes mellitus with stage 5 chronic kidney disease ([place])  - Pearce K Gernert has currently uncontrolled symptomatic type 2 DM since  50 years of age.  Recent labs reviewed, patient with ESRD on hemodialysis.  He presents today with no meter or logs to review (says he was in a rush to leave the house and forgot them).  His POCT A1c today is 8.1%, improving from last visit of 10.2%.  He is still awaiting a kidney transplant. He denies any recent episodes of hypoglycemia.  - I had a long discussion with him about the progressive nature of diabetes and the pathology behind its complications. -his diabetes is complicated by ESRD on hemodialysis, CHF, obesity/sedentary life and he remains at a high risk for more acute and chronic complications which include CAD, CVA, CKD, retinopathy, and neuropathy. These are all discussed in detail with him.  - Nutritional counseling repeated at each appointment due to patients tendency to fall back in to old habits.  - The patient admits there is a room for improvement in their diet and drink choices. -  Suggestion is made for the patient to avoid simple carbohydrates from their diet including Cakes, Sweet Desserts / Pastries, Ice Cream, Soda (diet and regular), Sweet Tea, Candies, Chips, Cookies, Sweet Pastries,  Store Bought Juices, Alcohol in Excess of  1-2 drinks a day, Artificial Sweeteners, Coffee Creamer, and "Sugar-free" Products. This will help patient to have stable blood glucose profile and potentially avoid unintended weight gain.   - I encouraged the patient to switch to  unprocessed or minimally processed complex starch and increased protein intake (animal or plant source), fruits, and vegetables.   - Patient is advised to stick to a routine mealtimes to eat 3 meals  a day and avoid unnecessary snacks ( to snack only to correct hypoglycemia).  - I have approached him with the following individualized plan to manage  his diabetes and patient agrees:   -Given the  lack of meter or logs to review, no changes will be made to his medications for safety purposes today.   He is advised to continue his Levemir  at 50 units SQ daily at bedtime.  He is advised to continue Novolog 8-14 units TID with meals if glucose is above 90 and he is eating.  Specific instructions on how to titrate insulin dose based on glucose readings given to patient in writing.  -He is advised to continue monitoring blood glucose at least 4 times per day, before meals and before bed, and call the clinic if he has readings less than 70 or greater than 300 for 3 tests in a row.  -This patient could greatly benefit from CGM and shows appropriate engagement in his diabetes management.  I sent in a Rx for Freestyle Libre to [facility] in [place] but he could not afford the copay.  - he is warned not to take insulin without proper monitoring per orders. - he is not a suitable candidate for Metformin, SGLT2 inhibitors.   - he will be considered for incretin therapy as appropriate next visit.  - Specific targets for  A1c;  LDL, HDL,  and Triglycerides were discussed with the patient.  2) Blood Pressure /Hypertension:  His blood pressure is not controlled to target.  He is advised to continue Norvasc 5 mg po daily, Coreg 25 mg po twice daily, Clonidine 0.3 mg TID, and Lasix 80 mg po daily.  Will defer any med changes to nephrology.  3) Lipids/Hyperlipidemia:    His most recent lipid panel from 07/24/20 shows controlled LDL of 47.   He is advised to continue Atorvastatin 80 mg po daily at bedtime and Zetia 10 mg po daily.  Side effects and precautions discussed with him.  4)  Weight/Diet:  His Body mass index is 48.87 kg/m.  -  clearly complicating his diabetes care.   he is  a candidate for weight loss. I discussed with him the fact that loss of 5 - 10% of his  current body weight will have the most impact on his diabetes management.  Exercise, and detailed carbohydrates information provided  -  detailed on discharge instructions.  5) Chronic Care/Health Maintenance: -he  is on Statin medications and is encouraged to initiate  and continue to follow up with Ophthalmology, Dentist,  Podiatrist at least yearly or according to recommendations, and advised to   stay away from smoking. I have recommended yearly flu vaccine and pneumonia vaccine at least every 5 years; moderate intensity exercise for up to 150 minutes weekly; and  sleep for at least 7 hours a day.   - he is  advised to maintain close follow up with Kelly Gravel, MD for primary care needs, as well as his other providers for optimal and coordinated care.     I spent 36 minutes in the care of the patient today including review of labs from [facility], Lipids, Thyroid Function, Hematology (current and previous including abstractions from other facilities); face-to-face time discussing  his blood glucose readings/logs, discussing hypoglycemia and hyperglycemia episodes and symptoms, medications doses, his options of short and long term treatment based on the latest standards of care / guidelines;  discussion about incorporating lifestyle medicine;  and documenting the encounter.    Please refer to Patient Instructions for Blood Glucose Monitoring and Insulin/Medications Dosing Guide"  in media tab for additional information. Please  also refer to " Patient Self Inventory" in the Media  tab for  reviewed elements of pertinent patient history.  Kelly Key participated in the discussions, expressed understanding, and voiced agreement with the above plans.  All questions were answered to his satisfaction. he is encouraged to contact clinic should he have any questions or concerns prior to his return visit.   Follow up plan: - Return in about 3 months (around 05/28/2021) for Diabetes F/U with A1c in office, No previsit labs, Bring meter and logs.  Rayetta Pigg, [facility] [address] [place], [place] [address] Phone: [phone] Fax: [phone]  02/25/2021, 1:28 PM

## 2021-02-25 NOTE — Patient Instructions (Signed)

## 2021-03-10 ENCOUNTER — Other Ambulatory Visit: Payer: Self-pay | Admitting: "Endocrinology

## 2021-04-11 ENCOUNTER — Other Ambulatory Visit: Payer: Self-pay

## 2021-04-11 DIAGNOSIS — Z1211 Encounter for screening for malignant neoplasm of colon: Secondary | ICD-10-CM

## 2021-04-11 MED ORDER — SUPREP BOWEL PREP KIT 17.5-3.13-1.6 GM/177ML PO SOLN: 1.0000 | ORAL | 0 refills | Status: DC

## 2021-04-20 ENCOUNTER — Emergency Department (HOSPITAL_COMMUNITY)
Admission: EM | Admit: 2021-04-20 | Discharge: 2021-04-20 | Disposition: A | Discharge status: Home / Self Care | Payer: Medicare Other | Attending: Emergency Medicine | Admitting: Emergency Medicine

## 2021-04-20 ENCOUNTER — Emergency Department (HOSPITAL_COMMUNITY): Payer: Medicare Other

## 2021-04-20 ENCOUNTER — Encounter (HOSPITAL_COMMUNITY): Payer: Self-pay | Admitting: *Deleted

## 2021-04-20 ENCOUNTER — Other Ambulatory Visit: Payer: Self-pay

## 2021-04-20 DIAGNOSIS — E1122 Type 2 diabetes mellitus with diabetic chronic kidney disease: Secondary | ICD-10-CM | POA: Diagnosis not present

## 2021-04-20 DIAGNOSIS — N186 End stage renal disease: Secondary | ICD-10-CM | POA: Insufficient documentation

## 2021-04-20 DIAGNOSIS — M25142 Fistula, left hand: Secondary | ICD-10-CM | POA: Diagnosis not present

## 2021-04-20 DIAGNOSIS — I132 Hypertensive heart and chronic kidney disease with heart failure and with stage 5 chronic kidney disease, or end stage renal disease: Secondary | ICD-10-CM | POA: Insufficient documentation

## 2021-04-20 DIAGNOSIS — R112 Nausea with vomiting, unspecified: Secondary | ICD-10-CM | POA: Diagnosis present

## 2021-04-20 DIAGNOSIS — J449 Chronic obstructive pulmonary disease, unspecified: Secondary | ICD-10-CM | POA: Insufficient documentation

## 2021-04-20 DIAGNOSIS — Z794 Long term (current) use of insulin: Secondary | ICD-10-CM | POA: Insufficient documentation

## 2021-04-20 DIAGNOSIS — Z79899 Other long term (current) drug therapy: Secondary | ICD-10-CM | POA: Insufficient documentation

## 2021-04-20 DIAGNOSIS — E785 Hyperlipidemia, unspecified: Secondary | ICD-10-CM | POA: Insufficient documentation

## 2021-04-20 DIAGNOSIS — U071 COVID-19: Secondary | ICD-10-CM | POA: Diagnosis not present

## 2021-04-20 DIAGNOSIS — R109 Unspecified abdominal pain: Secondary | ICD-10-CM | POA: Insufficient documentation

## 2021-04-20 DIAGNOSIS — Z992 Dependence on renal dialysis: Secondary | ICD-10-CM | POA: Diagnosis not present

## 2021-04-20 DIAGNOSIS — I5033 Acute on chronic diastolic (congestive) heart failure: Secondary | ICD-10-CM | POA: Diagnosis not present

## 2021-04-20 DIAGNOSIS — E1169 Type 2 diabetes mellitus with other specified complication: Secondary | ICD-10-CM | POA: Insufficient documentation

## 2021-04-20 DIAGNOSIS — Z7982 Long term (current) use of aspirin: Secondary | ICD-10-CM | POA: Diagnosis not present

## 2021-04-20 LAB — URINALYSIS, ROUTINE W REFLEX MICROSCOPIC
Bacteria, UA: NONE SEEN
Bilirubin Urine: NEGATIVE
Glucose, UA: 150 mg/dL — AB
Hgb urine dipstick: NEGATIVE
Ketones, ur: NEGATIVE mg/dL
Leukocytes,Ua: NEGATIVE
Nitrite: NEGATIVE
Protein, ur: 100 mg/dL — AB
Specific Gravity, Urine: 1.011 (ref 1.005–1.030)
pH: 8 (ref 5.0–8.0)

## 2021-04-20 LAB — CBC WITH DIFFERENTIAL/PLATELET
Abs Immature Granulocytes: 0.01 10*3/uL (ref 0.00–0.07)
Basophils Absolute: 0 10*3/uL (ref 0.0–0.1)
Basophils Relative: 1 %
Eosinophils Absolute: 0.1 10*3/uL (ref 0.0–0.5)
Eosinophils Relative: 2 %
HCT: 35.2 % — ABNORMAL LOW (ref 39.0–52.0)
Hemoglobin: 11 g/dL — ABNORMAL LOW (ref 13.0–17.0)
Immature Granulocytes: 0 %
Lymphocytes Relative: 26 %
Lymphs Abs: 1.6 10*3/uL (ref 0.7–4.0)
MCH: 28.4 pg (ref 26.0–34.0)
MCHC: 31.3 g/dL (ref 30.0–36.0)
MCV: 90.7 fL (ref 80.0–100.0)
Monocytes Absolute: 0.7 10*3/uL (ref 0.1–1.0)
Monocytes Relative: 12 %
Neutro Abs: 3.7 10*3/uL (ref 1.7–7.7)
Neutrophils Relative %: 59 %
Platelets: 232 10*3/uL (ref 150–400)
RBC: 3.88 MIL/uL — ABNORMAL LOW (ref 4.22–5.81)
RDW: 14 % (ref 11.5–15.5)
WBC: 6.2 10*3/uL (ref 4.0–10.5)
nRBC: 0 % (ref 0.0–0.2)

## 2021-04-20 LAB — COMPREHENSIVE METABOLIC PANEL
ALT: 18 U/L (ref 0–44)
AST: 18 U/L (ref 15–41)
Albumin: 3.9 g/dL (ref 3.5–5.0)
Alkaline Phosphatase: 58 U/L (ref 38–126)
Anion gap: 12 (ref 5–15)
BUN: 44 mg/dL — ABNORMAL HIGH (ref 6–20)
CO2: 30 mmol/L (ref 22–32)
Calcium: 8.7 mg/dL — ABNORMAL LOW (ref 8.9–10.3)
Chloride: 91 mmol/L — ABNORMAL LOW (ref 98–111)
Creatinine, Ser: 8.08 mg/dL — ABNORMAL HIGH (ref 0.61–1.24)
GFR, Estimated: 7 mL/min — ABNORMAL LOW (ref >60)
Glucose, Bld: 225 mg/dL — ABNORMAL HIGH (ref 70–99)
Potassium: 4.2 mmol/L (ref 3.5–5.1)
Sodium: 133 mmol/L — ABNORMAL LOW (ref 135–145)
Total Bilirubin: 0.4 mg/dL (ref 0.3–1.2)
Total Protein: 7.7 g/dL (ref 6.5–8.1)

## 2021-04-20 LAB — RESP PANEL BY RT-PCR (FLU A&B, COVID) ARPGX2
Influenza A by PCR: NEGATIVE
Influenza B by PCR: NEGATIVE
SARS Coronavirus 2 by RT PCR: POSITIVE — AB

## 2021-04-20 LAB — BRAIN NATRIURETIC PEPTIDE: B Natriuretic Peptide: 208 pg/mL — ABNORMAL HIGH (ref 0.0–100.0)

## 2021-04-20 LAB — MAGNESIUM: Magnesium: 1.9 mg/dL (ref 1.7–2.4)

## 2021-04-20 LAB — LIPASE, BLOOD: Lipase: 47 U/L (ref 11–51)

## 2021-04-20 MED ORDER — BEBTELOVIMAB 175 MG/2 ML IV (EUA): 175.0000 mg | Freq: Once | INTRAMUSCULAR | Status: DC

## 2021-04-20 MED ORDER — FAMOTIDINE IN NACL 20-0.9 MG/50ML-% IV SOLN: 20.0000 mg | Freq: Once | INTRAVENOUS | Status: DC | PRN

## 2021-04-20 MED ORDER — SODIUM CHLORIDE 0.9 % IV SOLN: INTRAVENOUS | Status: DC | PRN

## 2021-04-20 MED ORDER — ALBUTEROL SULFATE HFA 108 (90 BASE) MCG/ACT IN AERS: 2.0000 | INHALATION_SPRAY | Freq: Once | RESPIRATORY_TRACT | Status: DC | PRN

## 2021-04-20 MED ORDER — METHYLPREDNISOLONE SODIUM SUCC 125 MG IJ SOLR: 125.0000 mg | Freq: Once | INTRAMUSCULAR | Status: AC | PRN

## 2021-04-20 MED ORDER — EPINEPHRINE 0.3 MG/0.3ML IJ SOAJ: 0.3000 mg | Freq: Once | INTRAMUSCULAR | Status: DC | PRN

## 2021-04-20 MED ORDER — BEBTELOVIMAB 175 MG/2 ML IV (EUA)
175.0000 mg | Freq: Once | INTRAMUSCULAR | Status: AC
  Administered 2021-04-20: 175 mg via INTRAVENOUS
  Filled 2021-04-20: qty 2

## 2021-04-20 MED ORDER — FAMOTIDINE IN NACL 20-0.9 MG/50ML-% IV SOLN: 20.0000 mg | Freq: Once | INTRAVENOUS | Status: AC | PRN

## 2021-04-20 MED ORDER — DIPHENHYDRAMINE HCL 50 MG/ML IJ SOLN: 50.0000 mg | Freq: Once | INTRAMUSCULAR | Status: AC | PRN

## 2021-04-20 MED ORDER — EPINEPHRINE 0.3 MG/0.3ML IJ SOAJ: 0.3000 mg | Freq: Once | INTRAMUSCULAR | Status: AC | PRN

## 2021-04-20 MED ORDER — ALBUTEROL SULFATE HFA 108 (90 BASE) MCG/ACT IN AERS: 2.0000 | INHALATION_SPRAY | Freq: Once | RESPIRATORY_TRACT | Status: AC | PRN

## 2021-04-20 MED ORDER — METHYLPREDNISOLONE SODIUM SUCC 125 MG IJ SOLR: 125.0000 mg | Freq: Once | INTRAMUSCULAR | Status: DC | PRN

## 2021-04-20 MED ORDER — ONDANSETRON HCL 4 MG/2ML IJ SOLN
4.0000 mg | Freq: Once | INTRAMUSCULAR | Status: AC
  Administered 2021-04-20: 4 mg via INTRAVENOUS
  Filled 2021-04-20: qty 2

## 2021-04-20 MED ORDER — SODIUM CHLORIDE 0.9 % IV BOLUS
500.0000 mL | Freq: Once | INTRAVENOUS | Status: AC
  Administered 2021-04-20: 500 mL via INTRAVENOUS

## 2021-04-20 MED ORDER — DIPHENHYDRAMINE HCL 50 MG/ML IJ SOLN: 50.0000 mg | Freq: Once | INTRAMUSCULAR | Status: DC | PRN

## 2021-04-20 NOTE — ED Notes (Signed)
Patient monitored for 15 minutes after bebtelovimab given, no allergic reactions noted.

## 2021-04-20 NOTE — ED Notes (Signed)
Patient given packet about monoclonal antibodies, verbalized understanding. Verbal consent given to Domenica Reamer RN and Deno Etienne PA.

## 2021-04-20 NOTE — ED Notes (Signed)
bp was 219/144, pt states that it gets this high when he doesn't take his meds and he hasn't taken them for the night pa notified, per pa ok for d/c advised to take bp meds when he gets home.

## 2021-04-20 NOTE — ED Notes (Signed)
Date and time results received: 04/20/21 '@1351'$   Test: Covid Critical Value: positive  Name of Provider Notified: Deno Etienne PA  Orders Received? Or Actions Taken?: Orders Received - See Orders for details

## 2021-04-20 NOTE — ED Provider Notes (Signed)
[facility] EMERGENCY DEPARTMENT Provider Note   CSN: [id number] Arrival date & time: 04/20/21  1103     History Chief Complaint  Patient presents with   Vomiting    Kelly Key is a 50 y.o. male.  HPI  Patient with significant medical history of CHF with preserved ejection fraction, chronic kidney disease on hemodialysis Tuesday Thursday Saturdays, diabetes, hypertension, obesity presents to the emergency department with chief complaint of nausea, vomiting, and stomach pain.  Patient states this started on Thursday, states it started after he ate a bowl of cherries, he started to have some upper stomach pain, which led to consistent nausea and vomiting.  He denies seeing hematemesis, coffee-ground emesis, denies constipation, diarrhea.  He has subjective chills with nasal congestion, he denies sore throat, cough, general body aches, he denies recent sick contacts, is up-to-date on his COVID and  his influenza shot.  States he has no significant abdominal history, he denies smoking, NSAID use, denies history of stomach ulcers, pancreatitis, diverticulitis, bowel obstruction.  Patient states he went to his dialysis treatment today only got 2 hours worth of it as he was not feeling well, states he got his full treatment on Thursday, he denies worsening shortness of breath, chest pain, worsening pedal edema.  He denies any alleviating factors.  Past Medical History:  Diagnosis Date   Acute diastolic CHF (congestive heart failure) ([place]) 06/11/2012   EF 50-55% [facility])   Chronic kidney disease    COPD (chronic obstructive pulmonary disease) (HCC)    Diabetes mellitus    A1c 11.5 (06/11/2012).   Dyspnea    Gout    Hepatic steatosis 06/11/2012   Elevated LFTs   Hyperlipemia    Malignant hypertension    Microcytic anemia 06/12/2012   Obesity     Patient Active Problem List   Diagnosis Date Noted   Mixed hyperlipidemia 08/13/2020   Acute gout of right knee 08/03/2019    Orthostasis    Muscle strain of right wrist    Pain    Physical debility 07/16/2019   ESRD on dialysis [facility])    Anemia of chronic disease    Uncontrolled type 2 diabetes mellitus with hyperglycemia (HCC)    Congestive heart failure (HCC)    Volume overload 07/05/2019   Anasarca 07/03/2019   Dyslipidemia 07/03/2019   Chronic combined systolic and diastolic CHF (congestive heart failure) ([place]) 07/03/2019   Bilateral leg edema    CKD (chronic kidney disease) stage 5, GFR less than 15 ml/min ([place]) 06/22/2019   Uncontrolled type 2 diabetes mellitus with hyperglycemia, with long-term current use of insulin ([place]) 06/22/2019   Hypokalemia    Thromboembolism ([place]) 02/20/2019   AKI (acute kidney injury) ([place]) 02/20/2019   Acute on chronic diastolic CHF (congestive heart failure) ([place]) 03/26/2018   Acute respiratory failure with hypoxia ([place]) 03/26/2018   Uncontrolled type 2 diabetes mellitus with stage 5 chronic kidney disease ([place]) 06/12/2012   Hyponatremia 06/12/2012   Microcytic anemia 06/12/2012   Essential hypertension, benign 06/11/2012   Elevated LFTs 06/11/2012   Morbid obesity ([place]) 06/11/2012   Hepatic steatosis 06/11/2012    Past Surgical History:  Procedure Laterality Date   AV FISTULA PLACEMENT Left 11/11/2019   Procedure: ARTERIOVENOUS (AV) FISTULA CREATION LEFT BRACHIOCEPHALIC ARM;  Surgeon: Serafina Mitchell, MD;  Location: [facility] OR;  Service: Vascular;  Laterality: Left;   IR FLUORO GUIDE CV LINE RIGHT  07/08/2019   IR REMOVAL TUN CV CATH W/O FL  03/23/2020  IR US GUIDE VASC ACCESS RIGHT  07/08/2019   NO PAST SURGERIES     VASCULAR SURGERY         Family History  Problem Relation Age of Onset   Gout Mother    Asthma Mother    Diabetes Father    Heart failure Father    Diabetes Sister    Hypertension Brother    Pancreatic cancer Brother    Diabetes Sister     Social History   Tobacco Use   Smoking status: Never   Smokeless tobacco: Never  Vaping Use   Vaping  Use: Never used  Substance Use Topics   Alcohol use: Not Currently    Comment: haven't drank in over 5 years    Drug use: No    Home Medications Prior to Admission medications   Medication Sig Start Date End Date Taking? Authorizing Provider  acetaminophen (TYLENOL) 500 MG tablet Take 1,000 mg by mouth every 6 (six) hours as needed for moderate pain or headache.    [provider]  albuterol (VENTOLIN HFA) 108 (90 Base) MCG/ACT inhaler Inhale 2 puffs into the lungs every 4 (four) hours as needed for wheezing or shortness of breath.  03/24/19   [provider]  allopurinol (ZYLOPRIM) 100 MG tablet Take 2 tablets (200 mg total) by mouth daily. 08/04/19   Love, Ivan Anchors, PA-C  amLODipine (NORVASC) 5 MG tablet Take 1 tablet (5 mg total) by mouth daily. 08/03/19   Love, Ivan Anchors, PA-C  aspirin EC 81 MG EC tablet Take 1 tablet (81 mg total) by mouth daily. 08/04/19   Love, Ivan Anchors, PA-C  atorvastatin (LIPITOR) 80 MG tablet Take 1 tablet (80 mg total) by mouth daily. 12/05/19   Arnoldo Lenis, MD  Calcium Carbonate Antacid (CALCIUM CARBONATE, DOSED IN MG ELEMENTAL CALCIUM,) 1250 MG/5ML SUSP Take 5 mLs (500 mg of elemental calcium total) by mouth every 6 (six) hours as needed for indigestion. 07/16/19   Amin, Jeanella Flattery, MD  carvedilol (COREG) 25 MG tablet Take 1 tablet (25 mg total) by mouth 2 (two) times daily with a meal. 08/03/19   Love, Ivan Anchors, PA-C  cephALEXin (KEFLEX) 500 MG capsule Take 1 capsule (500 mg total) by mouth 4 (four) times daily. 12/15/20   Triplett, Tammy, PA-C  cloNIDine (CATAPRES) 0.3 MG tablet Take 1 tablet (0.3 mg total) by mouth 3 (three) times daily. 08/03/19 08/22/20  Love, Ivan Anchors, PA-C  diclofenac sodium (VOLTAREN) 1 % GEL Apply 2 g topically 4 (four) times daily. Patient taking differently: Apply 2 g topically 4 (four) times daily as needed (pain). 08/03/19   Love, Ivan Anchors, PA-C  ezetimibe (ZETIA) 10 MG tablet Take 10 mg by mouth every evening. 01/20/19    [provider]  fluticasone (FLONASE) 50 MCG/ACT nasal spray Place 1 spray into both nostrils daily. Patient taking differently: Place 1 spray into both nostrils daily as needed for allergies. 04/04/18 08/22/20  Manuella Ghazi, Pratik D, DO  furosemide (LASIX) 80 MG tablet Take 80 mg by mouth daily. 10/11/19   [provider]  gabapentin (NEURONTIN) 100 MG capsule Take 1 capsule (100 mg total) by mouth 3 (three) times daily. 08/03/19   Love, Ivan Anchors, PA-C  glucose blood (ACCU-CHEK AVIVA PLUS) test strip Use as instructed TO CHECK BLOOD GLUCOSE THREE TIMES DAILY 08/15/20   Nida, Marella Chimes, MD  HYDROcodone-acetaminophen (NORCO/VICODIN) 5-325 MG tablet Take 1 tablet by mouth every 6 (six) hours as needed for severe pain. 11/11/19  Dagoberto Ligas, PA-C  insulin detemir (LEVEMIR FLEXTOUCH) 100 UNIT/ML FlexPen Inject 50 Units into the skin at bedtime. 03/11/21   Cassandria Anger, MD  Insulin Pen Needle (B-D ULTRAFINE III SHORT PEN) 31G X 8 MM MISC 1 each by Does not apply route as directed. 08/13/20   Cassandria Anger, MD  linaclotide Rolan Lipa) 145 MCG CAPS capsule Take 1 capsule (145 mcg total) by mouth daily before breakfast. 04/05/18   Manuella Ghazi, Pratik D, DO  multivitamin (RENA-VIT) TABS tablet Take 1 tablet by mouth at bedtime. 08/03/19   Love, Ivan Anchors, PA-C  Na Sulfate-K Sulfate-Mg Sulf (SUPREP BOWEL PREP KIT) 17.5-3.13-1.6 GM/177ML SOLN Take 1 kit by mouth as directed. 04/11/21   Lucilla Lame, MD  NOVOLOG FLEXPEN 100 UNIT/ML FlexPen INJECT 8 TO 14 UNITS SUBCUTANEOUSLY THREE TIMES DAILY WITH MEALS 03/11/21   Nida, Marella Chimes, MD  OZEMPIC, 0.25 OR 0.5 MG/DOSE, 2 MG/1.5ML SOPN Inject 0.25 mg into the skin once a week. 11/23/20   [provider]  sevelamer carbonate (RENVELA) 800 MG tablet Take 3 tablets (2,400 mg total) by mouth 3 (three) times daily with meals. 08/03/19   Love, Ivan Anchors, PA-C    Allergies    Lisinopril and Hydralazine  Review of Systems   Review of  Systems  Constitutional:  Positive for chills. Negative for fever.  HENT:  Positive for congestion. Negative for sore throat.   Respiratory:  Negative for cough and shortness of breath.   Cardiovascular:  Negative for chest pain.  Gastrointestinal:  Positive for abdominal pain, nausea and vomiting. Negative for diarrhea.  Genitourinary:  Negative for enuresis.  Musculoskeletal:  Negative for back pain.  Skin:  Negative for rash.  Neurological:  Positive for headaches. Negative for dizziness.  Hematological:  Does not bruise/bleed easily.   Physical Exam Updated Vital Signs BP (!) 187/113 (BP Location: Right Wrist)   Pulse 81   Temp 98 F (36.7 C) (Oral)   Resp 20   Ht 6' 1"  (1.854 m)   Wt 129.7 kg   SpO2 100%   BMI 37.73 kg/m   Physical Exam Vitals and nursing note reviewed.  Constitutional:      General: He is not in acute distress.    Appearance: He is not ill-appearing.  HENT:     Head: Normocephalic and atraumatic.     Right Ear: Tympanic membrane normal.     Left Ear: Tympanic membrane normal.     Nose: Congestion present.     Comments: Bilateral erythematous turbinates    Mouth/Throat:     Mouth: Mucous membranes are moist.     Pharynx: Oropharynx is clear. No oropharyngeal exudate or posterior oropharyngeal erythema.  Eyes:     Conjunctiva/sclera: Conjunctivae normal.  Cardiovascular:     Rate and Rhythm: Normal rate and regular rhythm.     Pulses: Normal pulses.     Heart sounds: No murmur heard.   No friction rub. No gallop.  Pulmonary:     Effort: No respiratory distress.     Breath sounds: No wheezing, rhonchi or rales.  Abdominal:     Palpations: Abdomen is soft.     Tenderness: There is abdominal tenderness. There is no right CVA tenderness or left CVA tenderness.     Comments: Abdomen was visualized, is nondistended, normal bowel sounds, dull to percussion, he had tenderness in his upper abdomen, no guarding, rebound tenderness, peritoneal sign, no  Murphy sign or McBurney point.  Musculoskeletal:     Right lower  leg: No edema.     Left lower leg: No edema.  Skin:    General: Skin is warm and dry.     Comments: Patient has a fistula on his left arm, palpable thrill, no signs of infection present  Neurological:     Mental Status: He is alert.  Psychiatric:        Mood and Affect: Mood normal.    ED Results / Procedures / Treatments   Labs (all labs ordered are listed, but only abnormal results are displayed) Labs Reviewed  RESP PANEL BY RT-PCR (FLU A&B, COVID) ARPGX2 - Abnormal; Notable for the following components:      Result Value   SARS Coronavirus 2 by RT PCR POSITIVE (*)    All other components within normal limits  COMPREHENSIVE METABOLIC PANEL - Abnormal; Notable for the following components:   Sodium 133 (*)    Chloride 91 (*)    Glucose, Bld 225 (*)    BUN 44 (*)    Creatinine, Ser 8.08 (*)    Calcium 8.7 (*)    GFR, Estimated 7 (*)    All other components within normal limits  BRAIN NATRIURETIC PEPTIDE - Abnormal; Notable for the following components:   B Natriuretic Peptide 208.0 (*)    All other components within normal limits  CBC WITH DIFFERENTIAL/PLATELET - Abnormal; Notable for the following components:   RBC 3.88 (*)    Hemoglobin 11.0 (*)    HCT 35.2 (*)    All other components within normal limits  URINALYSIS, ROUTINE W REFLEX MICROSCOPIC - Abnormal; Notable for the following components:   Color, Urine STRAW (*)    Glucose, UA 150 (*)    Protein, ur 100 (*)    All other components within normal limits  LIPASE, BLOOD  MAGNESIUM    EKG None  Radiology CT ABDOMEN PELVIS WO CONTRAST  Result Date: 04/20/2021 CLINICAL DATA:  Abdominal pain and vomiting for 2 days, unable to complete dialysis EXAM: CT ABDOMEN AND PELVIS WITHOUT CONTRAST TECHNIQUE: Multidetector CT imaging of the abdomen and pelvis was performed following the standard protocol without IV contrast. COMPARISON:  12/15/2020 FINDINGS:  Lower chest: No acute abnormality. Coronary artery calcifications. Trace pericardial effusion, unchanged compared to prior examination Hepatobiliary: No solid liver abnormality is seen. No gallstones, gallbladder wall thickening, or biliary dilatation. Pancreas: Unremarkable. No pancreatic ductal dilatation or surrounding inflammatory changes. Spleen: Normal in size without significant abnormality. Adrenals/Urinary Tract: Adrenal glands are unremarkable. Kidneys are normal, without renal calculi, solid lesion, or hydronephrosis. Mild thickening of the urinary bladder wall. Stomach/Bowel: Stomach is within normal limits. Appendix appears normal. No evidence of bowel wall thickening, distention, or inflammatory changes. Vascular/Lymphatic: Aortic atherosclerosis. No enlarged abdominal or pelvic lymph nodes. Reproductive: No mass or other significant abnormality. Other: Fat containing umbilical hernia.  No abdominopelvic ascites. Musculoskeletal: No acute or significant osseous findings. IMPRESSION: 1. No non-contrast CT findings of the abdomen or pelvis to explain abdominal pain or vomiting. 2. Mild thickening of the urinary bladder wall, suggestive of nonspecific infectious or inflammatory cystitis. Correlate with urinalysis. 3. Coronary artery disease. Aortic Atherosclerosis (ICD10-I70.0). Electronically Signed   By: Eddie Candle M.D.   On: 04/20/2021 14:27   DG Chest Port 1 View  Result Date: 04/20/2021 CLINICAL DATA:  Bibasilar rales.  History of COPD. EXAM: PORTABLE CHEST 1 VIEW COMPARISON:  07/03/2019 FINDINGS: The cardiac silhouette is borderline enlarged, accentuated by portable AP technique. There is mild peribronchial cuffing. No confluent airspace opacity, overt pulmonary  edema, sizable pleural effusion, pneumothorax is identified. No acute osseous abnormality is seen. IMPRESSION: Mild peribronchial cuffing which could reflect bronchitis or vascular congestion. No airspace consolidation or overt  pulmonary edema Electronically Signed   By: Logan Bores M.D.   On: 04/20/2021 13:03    Procedures Procedures   Medications Ordered in ED Medications  bebtelovimab EUA injection SOLN 175 mg (has no administration in time range)  0.9 %  sodium chloride infusion (has no administration in time range)  diphenhydrAMINE (BENADRYL) injection 50 mg (has no administration in time range)  famotidine (PEPCID) IVPB 20 mg premix (has no administration in time range)  methylPREDNISolone sodium succinate (SOLU-MEDROL) 125 mg/2 mL injection 125 mg (has no administration in time range)  albuterol (VENTOLIN HFA) 108 (90 Base) MCG/ACT inhaler 2 puff (has no administration in time range)  EPINEPHrine (EPI-PEN) injection 0.3 mg (has no administration in time range)  0.9 %  sodium chloride infusion (has no administration in time range)  diphenhydrAMINE (BENADRYL) injection 50 mg (has no administration in time range)  famotidine (PEPCID) IVPB 20 mg premix (has no administration in time range)  methylPREDNISolone sodium succinate (SOLU-MEDROL) 125 mg/2 mL injection 125 mg (has no administration in time range)  albuterol (VENTOLIN HFA) 108 (90 Base) MCG/ACT inhaler 2 puff (has no administration in time range)  EPINEPHrine (EPI-PEN) injection 0.3 mg (has no administration in time range)  sodium chloride 0.9 % bolus 500 mL (0 mLs Intravenous Stopped 04/20/21 1321)  ondansetron (ZOFRAN) injection 4 mg (4 mg Intravenous Given 04/20/21 1222)  bebtelovimab EUA injection SOLN 175 mg (175 mg Intravenous Given 04/20/21 1613)    ED Course  I have reviewed the triage vital signs and the nursing notes.  Pertinent labs & imaging results that were available during my care of the patient were reviewed by me and considered in my medical decision making (see chart for details).    MDM Rules/Calculators/A&P                         Initial impression-patient presents with nausea, vomiting, stomach pain.  He is alert, does not  appear in acute stress, vital signs reassuring.  Suspect possible gastritis, will provide patient with fluids, antiemetics, obtain basic lab work-up and reassess.  Work-up-CBC shows normocytic anemia with hemoglobin 11, CMP shows hyponatremia 133, hyperglycemia 225, BUN of 44, creatinine 8.0, lipase 47, BNP 208, mag 1.9, respiratory panel positive for COVID chest x-ray negative for acute findings.  CT abdomen pelvis shows no acute abnormalities, mild thickening of the urinary bladder wall severe for nonspecific infectious inflammatory cystitis.  Reassessment-patient was reassessed, states he is feeling better but continues to have upper abdominal pain.  Will obtain CT abdomen pelvis for further evaluation.  Noted that patient is COVID-positive, and for she cannot tolerate outpatient antivirals, will consult with pharmacy for further recommendations on COVID treatment.  Updated patient on recommendation from pharmacist, he is agreement this, patient will receive infusion treatment here in the emergency department.  Patient was reassessed after antiviral medication he has no complaints this time, vital signs remained stable.  Patient agreeable for discharge.  Consult- Spoke with [employer] she has ordered infusion treatment for patient.  Rule out-I have low suspicion for systemic infection as patient is nontoxic-appearing, vital signs reassuring, no officers infection present my exam.  Low suspicion for emergent hemodialysis as patient is not volume overloaded, lung sounds are clear bilaterally, no pulmonary edema, BNP is only mildly elevated  208, no electrolyte derailments.  Low suspicion for pancreatitis as lipase within normal limits.  Low suspicion for ruptured stomach ulcer as abdomen is nondistended, dull to percussion, no peritoneal sign present my exam.  Low suspicion for UTI, pyelonephritis, kidney stone as UA is negative for nitrates, leukocytes, patient is no CVA tenderness, CT imaging  negative for stranding around the kidneys, no stone present.  Suspect read on CT scan was an over read of bladder.  Low suspicion for diverticulitis as there is no diverticulosis seen on CT abdomen pelvis.  Low suspicion for liver or gallbladder normality as there is no elevation in liver enzymes or alk phos.  Plan- Nausea, vomiting, since resolved-suspect secondary due to COVID infection, patient received infusion here in the emergency department, will have him follow-up with post-COVID care, continue with medication as prescribed.  Vital signs have remained stable, no indication for hospital admission.  Patient given at home care as well strict return precautions.  Patient verbalized that they understood agreed to said plan.  Final Clinical Impression(s) / ED Diagnoses Final diagnoses:  COVID    Rx / DC Orders ED Discharge Orders          Ordered    BEBTELOVIMAB 175 MG/2 ML IV (EUA)   Once        04/20/21 1454    SODIUM CHLORIDE 0.9 % IV SOLN  As needed        04/20/21 1454    DIPHENHYDRAMINE HCL 50 MG/ML IJ SOLN  Once PRN        04/20/21 1454    FAMOTIDINE IN NACL 20-0.9 MG/50ML-% IV SOLN  Once PRN        04/20/21 1454    METHYLPREDNISOLONE SODIUM SUCC 125 MG IJ SOLR  Once PRN        04/20/21 1454    ALBUTEROL SULFATE HFA 108 (90 BASE) MCG/ACT IN AERS  Once PRN        04/20/21 1454    EPINEPHRINE 0.3 MG/0.3ML IJ SOAJ  Once PRN        04/20/21 1454    Hypersensitivity GRADE 1: Transient flushing or rash, or drug fever < 100.4 F       Comments: Routine, As needed For Until specified Hold infusion for at least 30 minutes Restart infusion at 50% infusion rate if asymptomatic   04/20/21 1454    Hypersensitivity GRADE 2: Rash, flushing, urticaria, dyspnea, or drug fever = or > 100.4 F       Comments: Routine, As needed For Until specified Hold infusion for 30 minutes Give medications as ordered   04/20/21 1454    Hypersensitivity GRADE 3: symptomatic bronchospasm, with or without  urticaria, parenteral medication management indicated, allergy-related edema/angioedema, or hypotension       Comments: Routine, As needed For Until specified Hold infusion for at least 30 minutes Give medications as ordered   04/20/21 1454    Hypersensitivity GRADE 4: Anaphylaxis       Comments: Routine, As needed For Until specified Hold infusion Give medications as ordered Call MD   04/20/21 Dickens, Novak Stgermaine J, PA-C 04/20/21 1821    Truddie Hidden, MD 04/21/21 [phone]

## 2021-04-20 NOTE — Discharge Instructions (Addendum)
You have been seen here for URI like symptoms.  I recommend taking Tylenol for fever control and pain control please follow dosing on the back of bottle.  I recommend staying hydrated and if you do not an appetite, I recommend soups as this will provide you with fluids and calories.    you are Covid positive you must self quarantine for 5 days starting on symptom onset, if at the end of those 5 days you are feeling better you may return back to school/work, if you continue to have symptoms you must self quarantine for additional 5 days.  I would like you to contact "post Covid care" as they will provide you with information how to manage your Covid symptoms.   You missed your dialysis treatment today, please call your facility to see if you need to come for make up treatment.  Come back to the emergency department if you develop chest pain, shortness of breath, severe abdominal pain, uncontrolled nausea, vomiting, diarrhea.

## 2021-04-20 NOTE — ED Triage Notes (Signed)
C/o vomiting for the past 2 days, unable to complete dialysis treatment today, states he is unable to eat and is feeling weak

## 2021-04-22 ENCOUNTER — Encounter: Admission: RE | Payer: Self-pay | Source: Home / Self Care

## 2021-04-22 ENCOUNTER — Ambulatory Visit: Admission: RE | Admit: 2021-04-22 | Payer: Medicare Other | Source: Home / Self Care | Admitting: Gastroenterology

## 2021-04-22 SURGERY — COLONOSCOPY WITH PROPOFOL
Anesthesia: General

## 2021-04-23 ENCOUNTER — Other Ambulatory Visit: Payer: Self-pay

## 2021-04-23 DIAGNOSIS — Z79899 Other long term (current) drug therapy: Secondary | ICD-10-CM | POA: Insufficient documentation

## 2021-04-23 DIAGNOSIS — I132 Hypertensive heart and chronic kidney disease with heart failure and with stage 5 chronic kidney disease, or end stage renal disease: Secondary | ICD-10-CM | POA: Insufficient documentation

## 2021-04-23 DIAGNOSIS — N186 End stage renal disease: Secondary | ICD-10-CM | POA: Diagnosis not present

## 2021-04-23 DIAGNOSIS — Z7982 Long term (current) use of aspirin: Secondary | ICD-10-CM | POA: Diagnosis not present

## 2021-04-23 DIAGNOSIS — Z992 Dependence on renal dialysis: Secondary | ICD-10-CM | POA: Insufficient documentation

## 2021-04-23 DIAGNOSIS — E1122 Type 2 diabetes mellitus with diabetic chronic kidney disease: Secondary | ICD-10-CM | POA: Diagnosis not present

## 2021-04-23 DIAGNOSIS — Z794 Long term (current) use of insulin: Secondary | ICD-10-CM | POA: Insufficient documentation

## 2021-04-23 DIAGNOSIS — J449 Chronic obstructive pulmonary disease, unspecified: Secondary | ICD-10-CM | POA: Insufficient documentation

## 2021-04-23 DIAGNOSIS — Z8616 Personal history of COVID-19: Secondary | ICD-10-CM | POA: Diagnosis not present

## 2021-04-23 DIAGNOSIS — R21 Rash and other nonspecific skin eruption: Secondary | ICD-10-CM | POA: Diagnosis not present

## 2021-04-23 DIAGNOSIS — I5042 Chronic combined systolic (congestive) and diastolic (congestive) heart failure: Secondary | ICD-10-CM | POA: Diagnosis not present

## 2021-04-24 ENCOUNTER — Emergency Department (HOSPITAL_COMMUNITY)
Admission: EM | Admit: 2021-04-24 | Discharge: 2021-04-24 | Disposition: A | Discharge status: Home / Self Care | Payer: Medicare Other | Attending: Emergency Medicine | Admitting: Emergency Medicine

## 2021-04-24 ENCOUNTER — Encounter (HOSPITAL_COMMUNITY): Payer: Self-pay | Admitting: Emergency Medicine

## 2021-04-24 DIAGNOSIS — B029 Zoster without complications: Secondary | ICD-10-CM

## 2021-04-24 MED ORDER — OXYCODONE-ACETAMINOPHEN 5-325 MG PO TABS: 1.0000 | ORAL_TABLET | Freq: Four times a day (QID) | ORAL | 0 refills | Status: DC | PRN

## 2021-04-24 MED ORDER — VALACYCLOVIR HCL 1 G PO TABS: 1000.0000 mg | ORAL_TABLET | Freq: Three times a day (TID) | ORAL | 0 refills | Status: DC

## 2021-04-24 MED ORDER — PREDNISONE 20 MG PO TABS
40.0000 mg | ORAL_TABLET | Freq: Once | ORAL | Status: AC
  Administered 2021-04-24: 40 mg via ORAL
  Filled 2021-04-24: qty 2

## 2021-04-24 MED ORDER — OXYCODONE-ACETAMINOPHEN 5-325 MG PO TABS
2.0000 | ORAL_TABLET | Freq: Once | ORAL | Status: AC
Start: 2021-04-24 — End: 2021-04-24
  Administered 2021-04-24: 2 via ORAL
  Filled 2021-04-24: qty 2

## 2021-04-24 MED ORDER — PREDNISONE 10 MG PO TABS: 20.0000 mg | ORAL_TABLET | Freq: Two times a day (BID) | ORAL | 0 refills | Status: DC

## 2021-04-24 MED ORDER — VALACYCLOVIR HCL 500 MG PO TABS
1000.0000 mg | ORAL_TABLET | Freq: Once | ORAL | Status: AC
  Administered 2021-04-24: 1000 mg via ORAL
  Filled 2021-04-24: qty 2

## 2021-04-24 NOTE — ED Triage Notes (Signed)
Pt with c/o R sided flank/back pain. Pt also states he has a rash to that area as well. Rash is [place] like in appearance and is non-draining. Pt also dx with Covid on 04/20/21.

## 2021-04-24 NOTE — ED Provider Notes (Signed)
[facility] EMERGENCY DEPARTMENT Provider Note   CSN: [id number] Arrival date & time: 04/23/21  2340     History Chief Complaint  Patient presents with   Back Pain   Rash    Kelly Key is a 50 y.o. male.  Patient is a 50 year old male presenting with pain to his right side.  He has had a burning pain for the past 2 days, then noticed a rash.  Patient denies fevers or chills, but was diagnosed recently with COVID-19.  He also has history of end-stage renal disease on hemodialysis, COPD, and CHF.  The history is provided by the patient.      Past Medical History:  Diagnosis Date   Acute diastolic CHF (congestive heart failure) ([place]) 06/11/2012   EF 50-55% [facility])   Chronic kidney disease    COPD (chronic obstructive pulmonary disease) (HCC)    Diabetes mellitus    A1c 11.5 (06/11/2012).   Dyspnea    Gout    Hepatic steatosis 06/11/2012   Elevated LFTs   Hyperlipemia    Malignant hypertension    Microcytic anemia 06/12/2012   Obesity     Patient Active Problem List   Diagnosis Date Noted   Mixed hyperlipidemia 08/13/2020   Acute gout of right knee 08/03/2019   Orthostasis    Muscle strain of right wrist    Pain    Physical debility 07/16/2019   ESRD on dialysis [facility])    Anemia of chronic disease    Uncontrolled type 2 diabetes mellitus with hyperglycemia (HCC)    Congestive heart failure (HCC)    Volume overload 07/05/2019   Anasarca 07/03/2019   Dyslipidemia 07/03/2019   Chronic combined systolic and diastolic CHF (congestive heart failure) ([place]) 07/03/2019   Bilateral leg edema    CKD (chronic kidney disease) stage 5, GFR less than 15 ml/min ([place]) 06/22/2019   Uncontrolled type 2 diabetes mellitus with hyperglycemia, with long-term current use of insulin ([place]) 06/22/2019   Hypokalemia    Thromboembolism ([place]) 02/20/2019   AKI (acute kidney injury) ([place]) 02/20/2019   Acute on chronic diastolic CHF (congestive heart failure) ([place]) 03/26/2018    Acute respiratory failure with hypoxia ([place]) 03/26/2018   Uncontrolled type 2 diabetes mellitus with stage 5 chronic kidney disease ([place]) 06/12/2012   Hyponatremia 06/12/2012   Microcytic anemia 06/12/2012   Essential hypertension, benign 06/11/2012   Elevated LFTs 06/11/2012   Morbid obesity ([place]) 06/11/2012   Hepatic steatosis 06/11/2012    Past Surgical History:  Procedure Laterality Date   AV FISTULA PLACEMENT Left 11/11/2019   Procedure: ARTERIOVENOUS (AV) FISTULA CREATION LEFT BRACHIOCEPHALIC ARM;  Surgeon: Serafina Mitchell, MD;  Location: [facility] OR;  Service: Vascular;  Laterality: Left;   IR FLUORO GUIDE CV LINE RIGHT  07/08/2019   IR REMOVAL TUN CV CATH W/O FL  03/23/2020   IR US GUIDE VASC ACCESS RIGHT  07/08/2019   NO PAST SURGERIES     VASCULAR SURGERY         Family History  Problem Relation Age of Onset   Gout Mother    Asthma Mother    Diabetes Father    Heart failure Father    Diabetes Sister    Hypertension Brother    Pancreatic cancer Brother    Diabetes Sister     Social History   Tobacco Use   Smoking status: Never   Smokeless tobacco: Never  Vaping Use   Vaping Use: Never used  Substance Use Topics  Alcohol use: Not Currently    Comment: haven't drank in over 5 years    Drug use: No    Home Medications Prior to Admission medications   Medication Sig Start Date End Date Taking? Authorizing Provider  acetaminophen (TYLENOL) 500 MG tablet Take 1,000 mg by mouth every 6 (six) hours as needed for moderate pain or headache.    [provider]  albuterol (VENTOLIN HFA) 108 (90 Base) MCG/ACT inhaler Inhale 2 puffs into the lungs every 4 (four) hours as needed for wheezing or shortness of breath.  03/24/19   [provider]  allopurinol (ZYLOPRIM) 100 MG tablet Take 2 tablets (200 mg total) by mouth daily. 08/04/19   Love, Ivan Anchors, PA-C  amLODipine (NORVASC) 5 MG tablet Take 1 tablet (5 mg total) by mouth daily. 08/03/19   Love, Ivan Anchors,  PA-C  aspirin EC 81 MG EC tablet Take 1 tablet (81 mg total) by mouth daily. 08/04/19   Love, Ivan Anchors, PA-C  atorvastatin (LIPITOR) 80 MG tablet Take 1 tablet (80 mg total) by mouth daily. 12/05/19   Arnoldo Lenis, MD  Calcium Carbonate Antacid (CALCIUM CARBONATE, DOSED IN MG ELEMENTAL CALCIUM,) 1250 MG/5ML SUSP Take 5 mLs (500 mg of elemental calcium total) by mouth every 6 (six) hours as needed for indigestion. 07/16/19   Amin, Jeanella Flattery, MD  carvedilol (COREG) 25 MG tablet Take 1 tablet (25 mg total) by mouth 2 (two) times daily with a meal. 08/03/19   Love, Ivan Anchors, PA-C  cephALEXin (KEFLEX) 500 MG capsule Take 1 capsule (500 mg total) by mouth 4 (four) times daily. 12/15/20   Triplett, Tammy, PA-C  cloNIDine (CATAPRES) 0.3 MG tablet Take 1 tablet (0.3 mg total) by mouth 3 (three) times daily. 08/03/19 08/22/20  Love, Ivan Anchors, PA-C  diclofenac sodium (VOLTAREN) 1 % GEL Apply 2 g topically 4 (four) times daily. Patient taking differently: Apply 2 g topically 4 (four) times daily as needed (pain). 08/03/19   Love, Ivan Anchors, PA-C  ezetimibe (ZETIA) 10 MG tablet Take 10 mg by mouth every evening. 01/20/19   [provider]  fluticasone (FLONASE) 50 MCG/ACT nasal spray Place 1 spray into both nostrils daily. Patient taking differently: Place 1 spray into both nostrils daily as needed for allergies. 04/04/18 08/22/20  Manuella Ghazi, Pratik D, DO  furosemide (LASIX) 80 MG tablet Take 80 mg by mouth daily. 10/11/19   [provider]  gabapentin (NEURONTIN) 100 MG capsule Take 1 capsule (100 mg total) by mouth 3 (three) times daily. 08/03/19   Love, Ivan Anchors, PA-C  glucose blood (ACCU-CHEK AVIVA PLUS) test strip Use as instructed TO CHECK BLOOD GLUCOSE THREE TIMES DAILY 08/15/20   Nida, Marella Chimes, MD  HYDROcodone-acetaminophen (NORCO/VICODIN) 5-325 MG tablet Take 1 tablet by mouth every 6 (six) hours as needed for severe pain. 11/11/19   Dagoberto Ligas, PA-C  insulin detemir (LEVEMIR  FLEXTOUCH) 100 UNIT/ML FlexPen Inject 50 Units into the skin at bedtime. 03/11/21   Cassandria Anger, MD  Insulin Pen Needle (B-D ULTRAFINE III SHORT PEN) 31G X 8 MM MISC 1 each by Does not apply route as directed. 08/13/20   Cassandria Anger, MD  linaclotide Rolan Lipa) 145 MCG CAPS capsule Take 1 capsule (145 mcg total) by mouth daily before breakfast. 04/05/18   Manuella Ghazi, Pratik D, DO  multivitamin (RENA-VIT) TABS tablet Take 1 tablet by mouth at bedtime. 08/03/19   Love, Ivan Anchors, PA-C  Na Sulfate-K Sulfate-Mg Sulf (SUPREP BOWEL PREP KIT)  17.5-3.13-1.6 GM/177ML SOLN Take 1 kit by mouth as directed. 04/11/21   Lucilla Lame, MD  NOVOLOG FLEXPEN 100 UNIT/ML FlexPen INJECT 8 TO 14 UNITS SUBCUTANEOUSLY THREE TIMES DAILY WITH MEALS 03/11/21   Nida, Marella Chimes, MD  OZEMPIC, 0.25 OR 0.5 MG/DOSE, 2 MG/1.5ML SOPN Inject 0.25 mg into the skin once a week. 11/23/20   [provider]  sevelamer carbonate (RENVELA) 800 MG tablet Take 3 tablets (2,400 mg total) by mouth 3 (three) times daily with meals. 08/03/19   Love, Ivan Anchors, PA-C    Allergies    Lisinopril and Hydralazine  Review of Systems   Review of Systems  All other systems reviewed and are negative.  Physical Exam Updated Vital Signs BP (!) 154/95 (BP Location: Right Arm)   Pulse 72   Temp 97.8 F (36.6 C) (Oral)   Resp (!) 22   Ht 6' 1"  (1.854 m)   Wt 129.7 kg   SpO2 100%   BMI 37.73 kg/m   Physical Exam Constitutional:      General: He is not in acute distress.    Appearance: Normal appearance. He is not ill-appearing, toxic-appearing or diaphoretic.  HENT:     Head: Normocephalic and atraumatic.  Cardiovascular:     Rate and Rhythm: Normal rate and regular rhythm.  Pulmonary:     Effort: Pulmonary effort is normal.     Breath sounds: Normal breath sounds.  Musculoskeletal:        General: Normal range of motion.  Skin:    General: Skin is warm and dry.     Comments: There is a rash noted to the right flank.   It is vesicular in nature and in a dermatomal distribution.  Neurological:     Mental Status: He is alert.    ED Results / Procedures / Treatments   Labs (all labs ordered are listed, but only abnormal results are displayed) Labs Reviewed - No data to display  EKG None  Radiology No results found.  Procedures Procedures   Medications Ordered in ED Medications  predniSONE (DELTASONE) tablet 40 mg (has no administration in time range)  oxyCODONE-acetaminophen (PERCOCET/ROXICET) 5-325 MG per tablet 2 tablet (has no administration in time range)  valACYclovir (VALTREX) tablet 1,000 mg (has no administration in time range)    ED Course  I have reviewed the triage vital signs and the nursing notes.  Pertinent labs & imaging results that were available during my care of the patient were reviewed by me and considered in my medical decision making (see chart for details).    MDM Rules/Calculators/A&P  Patient with medical history as per HPI presenting with rash to the right flank consistent with shingles.  Patient to be treated with prednisone, Valtrex, pain medication, and follow-up with primary doctor as needed.  Final Clinical Impression(s) / ED Diagnoses Final diagnoses:  None    Rx / DC Orders ED Discharge Orders     None        Veryl Speak, MD 04/24/21 [phone]

## 2021-04-24 NOTE — Discharge Instructions (Addendum)
Begin taking Valtrex and prednisone as prescribed.  Begin taking Percocet as prescribed as needed for pain.  Follow-up with your primary doctor in the next week, and return to the ER if you develop any new and/or concerning symptoms.

## 2021-05-14 ENCOUNTER — Inpatient Hospital Stay (HOSPITAL_COMMUNITY)
Admission: EM | Admit: 2021-05-14 | Discharge: 2021-05-16 | DRG: 291 | Disposition: A | Discharge status: Home / Self Care | Payer: Medicare Other | Attending: Family Medicine | Admitting: Family Medicine

## 2021-05-14 ENCOUNTER — Encounter (HOSPITAL_COMMUNITY): Payer: Self-pay

## 2021-05-14 ENCOUNTER — Other Ambulatory Visit: Payer: Self-pay

## 2021-05-14 ENCOUNTER — Emergency Department (HOSPITAL_COMMUNITY): Payer: Medicare Other

## 2021-05-14 DIAGNOSIS — I5042 Chronic combined systolic (congestive) and diastolic (congestive) heart failure: Secondary | ICD-10-CM | POA: Diagnosis not present

## 2021-05-14 DIAGNOSIS — J189 Pneumonia, unspecified organism: Secondary | ICD-10-CM | POA: Diagnosis present

## 2021-05-14 DIAGNOSIS — I959 Hypotension, unspecified: Secondary | ICD-10-CM | POA: Diagnosis not present

## 2021-05-14 DIAGNOSIS — D631 Anemia in chronic kidney disease: Secondary | ICD-10-CM | POA: Diagnosis present

## 2021-05-14 DIAGNOSIS — Z992 Dependence on renal dialysis: Secondary | ICD-10-CM | POA: Diagnosis not present

## 2021-05-14 DIAGNOSIS — J449 Chronic obstructive pulmonary disease, unspecified: Secondary | ICD-10-CM | POA: Diagnosis present

## 2021-05-14 DIAGNOSIS — J9602 Acute respiratory failure with hypercapnia: Secondary | ICD-10-CM | POA: Diagnosis present

## 2021-05-14 DIAGNOSIS — E1122 Type 2 diabetes mellitus with diabetic chronic kidney disease: Secondary | ICD-10-CM | POA: Diagnosis present

## 2021-05-14 DIAGNOSIS — E1165 Type 2 diabetes mellitus with hyperglycemia: Secondary | ICD-10-CM | POA: Diagnosis present

## 2021-05-14 DIAGNOSIS — N2581 Secondary hyperparathyroidism of renal origin: Secondary | ICD-10-CM | POA: Diagnosis present

## 2021-05-14 DIAGNOSIS — R0602 Shortness of breath: Secondary | ICD-10-CM | POA: Diagnosis present

## 2021-05-14 DIAGNOSIS — E669 Obesity, unspecified: Secondary | ICD-10-CM | POA: Diagnosis present

## 2021-05-14 DIAGNOSIS — Z800 Family history of malignant neoplasm of digestive organs: Secondary | ICD-10-CM

## 2021-05-14 DIAGNOSIS — Z20822 Contact with and (suspected) exposure to covid-19: Secondary | ICD-10-CM | POA: Diagnosis present

## 2021-05-14 DIAGNOSIS — Z888 Allergy status to other drugs, medicaments and biological substances status: Secondary | ICD-10-CM

## 2021-05-14 DIAGNOSIS — E785 Hyperlipidemia, unspecified: Secondary | ICD-10-CM | POA: Diagnosis present

## 2021-05-14 DIAGNOSIS — I10 Essential (primary) hypertension: Secondary | ICD-10-CM | POA: Diagnosis present

## 2021-05-14 DIAGNOSIS — K760 Fatty (change of) liver, not elsewhere classified: Secondary | ICD-10-CM | POA: Diagnosis present

## 2021-05-14 DIAGNOSIS — I161 Hypertensive emergency: Secondary | ICD-10-CM | POA: Diagnosis present

## 2021-05-14 DIAGNOSIS — Z7952 Long term (current) use of systemic steroids: Secondary | ICD-10-CM

## 2021-05-14 DIAGNOSIS — Z79899 Other long term (current) drug therapy: Secondary | ICD-10-CM | POA: Diagnosis not present

## 2021-05-14 DIAGNOSIS — Z833 Family history of diabetes mellitus: Secondary | ICD-10-CM | POA: Diagnosis not present

## 2021-05-14 DIAGNOSIS — I5043 Acute on chronic combined systolic (congestive) and diastolic (congestive) heart failure: Secondary | ICD-10-CM | POA: Diagnosis present

## 2021-05-14 DIAGNOSIS — J9691 Respiratory failure, unspecified with hypoxia: Secondary | ICD-10-CM | POA: Insufficient documentation

## 2021-05-14 DIAGNOSIS — J810 Acute pulmonary edema: Secondary | ICD-10-CM

## 2021-05-14 DIAGNOSIS — Z9111 Patient's noncompliance with dietary regimen: Secondary | ICD-10-CM | POA: Diagnosis not present

## 2021-05-14 DIAGNOSIS — J9601 Acute respiratory failure with hypoxia: Secondary | ICD-10-CM | POA: Diagnosis present

## 2021-05-14 DIAGNOSIS — E872 Acidosis: Secondary | ICD-10-CM | POA: Diagnosis present

## 2021-05-14 DIAGNOSIS — I132 Hypertensive heart and chronic kidney disease with heart failure and with stage 5 chronic kidney disease, or end stage renal disease: Principal | ICD-10-CM | POA: Diagnosis present

## 2021-05-14 DIAGNOSIS — I214 Non-ST elevation (NSTEMI) myocardial infarction: Secondary | ICD-10-CM | POA: Diagnosis not present

## 2021-05-14 DIAGNOSIS — N186 End stage renal disease: Secondary | ICD-10-CM

## 2021-05-14 DIAGNOSIS — Z825 Family history of asthma and other chronic lower respiratory diseases: Secondary | ICD-10-CM | POA: Diagnosis not present

## 2021-05-14 DIAGNOSIS — Z7982 Long term (current) use of aspirin: Secondary | ICD-10-CM

## 2021-05-14 DIAGNOSIS — Z8249 Family history of ischemic heart disease and other diseases of the circulatory system: Secondary | ICD-10-CM

## 2021-05-14 DIAGNOSIS — I5033 Acute on chronic diastolic (congestive) heart failure: Secondary | ICD-10-CM | POA: Diagnosis present

## 2021-05-14 DIAGNOSIS — R0689 Other abnormalities of breathing: Secondary | ICD-10-CM

## 2021-05-14 DIAGNOSIS — Z794 Long term (current) use of insulin: Secondary | ICD-10-CM

## 2021-05-14 DIAGNOSIS — Z6841 Body Mass Index (BMI) 40.0 and over, adult: Secondary | ICD-10-CM | POA: Diagnosis not present

## 2021-05-14 DIAGNOSIS — R0600 Dyspnea, unspecified: Secondary | ICD-10-CM

## 2021-05-14 DIAGNOSIS — R0609 Other forms of dyspnea: Secondary | ICD-10-CM | POA: Diagnosis not present

## 2021-05-14 LAB — BASIC METABOLIC PANEL
Anion gap: 14 (ref 5–15)
BUN: 99 mg/dL — ABNORMAL HIGH (ref 6–20)
CO2: 23 mmol/L (ref 22–32)
Calcium: 8.6 mg/dL — ABNORMAL LOW (ref 8.9–10.3)
Chloride: 90 mmol/L — ABNORMAL LOW (ref 98–111)
Creatinine, Ser: 9.25 mg/dL — ABNORMAL HIGH (ref 0.61–1.24)
GFR, Estimated: 6 mL/min — ABNORMAL LOW (ref >60)
Glucose, Bld: 342 mg/dL — ABNORMAL HIGH (ref 70–99)
Potassium: 4.4 mmol/L (ref 3.5–5.1)
Sodium: 127 mmol/L — ABNORMAL LOW (ref 135–145)

## 2021-05-14 LAB — TROPONIN I (HIGH SENSITIVITY)
Troponin I (High Sensitivity): 24 ng/L — ABNORMAL HIGH (ref <18)
Troponin I (High Sensitivity): 92 ng/L — ABNORMAL HIGH (ref <18)

## 2021-05-14 LAB — CBC
HCT: 36.2 % — ABNORMAL LOW (ref 39.0–52.0)
Hemoglobin: 11.4 g/dL — ABNORMAL LOW (ref 13.0–17.0)
MCH: 29 pg (ref 26.0–34.0)
MCHC: 31.5 g/dL (ref 30.0–36.0)
MCV: 92.1 fL (ref 80.0–100.0)
Platelets: 287 10*3/uL (ref 150–400)
RBC: 3.93 MIL/uL — ABNORMAL LOW (ref 4.22–5.81)
RDW: 15.9 % — ABNORMAL HIGH (ref 11.5–15.5)
WBC: 13.9 10*3/uL — ABNORMAL HIGH (ref 4.0–10.5)
nRBC: 0 % (ref 0.0–0.2)

## 2021-05-14 LAB — BLOOD GAS, ARTERIAL
Acid-base deficit: 3 mmol/L — ABNORMAL HIGH (ref 0.0–2.0)
Bicarbonate: 21.2 mmol/L (ref 20.0–28.0)
FIO2: 50
O2 Saturation: 93.9 %
Patient temperature: 36.1
pCO2 arterial: 50.1 mmHg — ABNORMAL HIGH (ref 32.0–48.0)
pH, Arterial: 7.278 — ABNORMAL LOW (ref 7.350–7.450)
pO2, Arterial: 79 mmHg — ABNORMAL LOW (ref 83.0–108.0)

## 2021-05-14 LAB — HIV ANTIBODY (ROUTINE TESTING W REFLEX): HIV Screen 4th Generation wRfx: NONREACTIVE

## 2021-05-14 LAB — RESP PANEL BY RT-PCR (FLU A&B, COVID) ARPGX2
Influenza A by PCR: NEGATIVE
Influenza B by PCR: NEGATIVE
SARS Coronavirus 2 by RT PCR: NEGATIVE

## 2021-05-14 LAB — GLUCOSE, CAPILLARY
Glucose-Capillary: 264 mg/dL — ABNORMAL HIGH (ref 70–99)
Glucose-Capillary: 194 mg/dL — ABNORMAL HIGH (ref 70–99)
Glucose-Capillary: 283 mg/dL — ABNORMAL HIGH (ref 70–99)

## 2021-05-14 LAB — HEMOGLOBIN A1C
Hgb A1c MFr Bld: 8.2 % — ABNORMAL HIGH (ref 4.8–5.6)
Mean Plasma Glucose: 188.64 mg/dL

## 2021-05-14 LAB — MRSA NEXT GEN BY PCR, NASAL: MRSA by PCR Next Gen: NOT DETECTED

## 2021-05-14 MED ORDER — PENTAFLUOROPROP-TETRAFLUOROETH EX AERO
1.0000 "application " | INHALATION_SPRAY | CUTANEOUS | Status: DC | PRN
  Filled 2021-05-14: qty 30

## 2021-05-14 MED ORDER — INSULIN DETEMIR 100 UNIT/ML ~~LOC~~ SOLN
50.0000 [IU] | Freq: Every day | SUBCUTANEOUS | Status: DC
  Administered 2021-05-14 – 2021-05-15 (×2): 50 [IU] via SUBCUTANEOUS
  Filled 2021-05-14 (×3): qty 0.5

## 2021-05-14 MED ORDER — RENA-VITE PO TABS
1.0000 | ORAL_TABLET | Freq: Every day | ORAL | Status: DC
  Administered 2021-05-14 – 2021-05-15 (×2): 1 via ORAL
  Filled 2021-05-14 (×2): qty 1

## 2021-05-14 MED ORDER — INSULIN ASPART 100 UNIT/ML IJ SOLN
0.0000 [IU] | Freq: Three times a day (TID) | INTRAMUSCULAR | Status: DC
  Administered 2021-05-14 – 2021-05-15 (×3): 3 [IU] via SUBCUTANEOUS

## 2021-05-14 MED ORDER — LORAZEPAM 2 MG/ML IJ SOLN
INTRAMUSCULAR | Status: AC
  Filled 2021-05-14: qty 1

## 2021-05-14 MED ORDER — CHLORHEXIDINE GLUCONATE CLOTH 2 % EX PADS
6.0000 | MEDICATED_PAD | Freq: Every day | CUTANEOUS | Status: DC
  Administered 2021-05-14 – 2021-05-16 (×3): 6 via TOPICAL

## 2021-05-14 MED ORDER — LINACLOTIDE 145 MCG PO CAPS
145.0000 ug | ORAL_CAPSULE | Freq: Every day | ORAL | Status: DC
  Administered 2021-05-15: 145 ug via ORAL
  Filled 2021-05-14 (×2): qty 1

## 2021-05-14 MED ORDER — CHLORHEXIDINE GLUCONATE 0.12 % MT SOLN
15.0000 mL | Freq: Two times a day (BID) | OROMUCOSAL | Status: DC
  Administered 2021-05-14 – 2021-05-15 (×2): 15 mL via OROMUCOSAL
  Filled 2021-05-14 (×2): qty 15

## 2021-05-14 MED ORDER — SODIUM CHLORIDE 0.9% FLUSH
3.0000 mL | Freq: Two times a day (BID) | INTRAVENOUS | Status: DC
  Administered 2021-05-14 – 2021-05-15 (×2): 3 mL via INTRAVENOUS

## 2021-05-14 MED ORDER — NITROGLYCERIN IN D5W 200-5 MCG/ML-% IV SOLN
5.0000 ug/min | INTRAVENOUS | Status: DC
  Administered 2021-05-14: 5 ug/min via INTRAVENOUS
  Filled 2021-05-14: qty 250

## 2021-05-14 MED ORDER — ASPIRIN 81 MG PO CHEW
81.0000 mg | CHEWABLE_TABLET | Freq: Every day | ORAL | Status: DC
  Administered 2021-05-14 – 2021-05-15 (×2): 81 mg via ORAL
  Filled 2021-05-14 (×3): qty 1

## 2021-05-14 MED ORDER — INSULIN ASPART 100 UNIT/ML IJ SOLN
3.0000 [IU] | Freq: Three times a day (TID) | INTRAMUSCULAR | Status: DC
  Administered 2021-05-15 (×2): 3 [IU] via SUBCUTANEOUS

## 2021-05-14 MED ORDER — ONDANSETRON HCL 4 MG/2ML IJ SOLN: 4.0000 mg | Freq: Four times a day (QID) | INTRAMUSCULAR | Status: DC | PRN

## 2021-05-14 MED ORDER — HYDROCODONE-ACETAMINOPHEN 5-325 MG PO TABS: 1.0000 | ORAL_TABLET | Freq: Four times a day (QID) | ORAL | Status: DC | PRN

## 2021-05-14 MED ORDER — CALCIUM CARBONATE-VITAMIN D 500-200 MG-UNIT PO TABS: 1.0000 | ORAL_TABLET | Freq: Four times a day (QID) | ORAL | Status: DC | PRN

## 2021-05-14 MED ORDER — ORAL CARE MOUTH RINSE: 15.0000 mL | Freq: Two times a day (BID) | OROMUCOSAL | Status: DC

## 2021-05-14 MED ORDER — SODIUM CHLORIDE 0.9% FLUSH: 3.0000 mL | INTRAVENOUS | Status: DC | PRN

## 2021-05-14 MED ORDER — ACETAMINOPHEN 650 MG RE SUPP: 650.0000 mg | Freq: Four times a day (QID) | RECTAL | Status: DC | PRN

## 2021-05-14 MED ORDER — SODIUM CHLORIDE 0.9 % IV SOLN: 100.0000 mL | INTRAVENOUS | Status: DC | PRN

## 2021-05-14 MED ORDER — AMLODIPINE BESYLATE 5 MG PO TABS
10.0000 mg | ORAL_TABLET | Freq: Every day | ORAL | Status: DC
  Administered 2021-05-14 – 2021-05-15 (×2): 10 mg via ORAL
  Filled 2021-05-14 (×2): qty 2

## 2021-05-14 MED ORDER — CLONIDINE HCL 0.2 MG PO TABS
0.3000 mg | ORAL_TABLET | Freq: Three times a day (TID) | ORAL | Status: DC
  Administered 2021-05-14 – 2021-05-15 (×3): 0.3 mg via ORAL
  Filled 2021-05-14 (×3): qty 1

## 2021-05-14 MED ORDER — ACETAMINOPHEN 325 MG PO TABS
650.0000 mg | ORAL_TABLET | Freq: Four times a day (QID) | ORAL | Status: DC | PRN
  Administered 2021-05-15: 650 mg via ORAL
  Filled 2021-05-14: qty 2

## 2021-05-14 MED ORDER — BISACODYL 10 MG RE SUPP: 10.0000 mg | Freq: Every day | RECTAL | Status: DC | PRN

## 2021-05-14 MED ORDER — HEPARIN SODIUM (PORCINE) 5000 UNIT/ML IJ SOLN
5000.0000 [IU] | Freq: Three times a day (TID) | INTRAMUSCULAR | Status: DC
  Administered 2021-05-14 – 2021-05-16 (×5): 5000 [IU] via SUBCUTANEOUS
  Filled 2021-05-14 (×5): qty 1

## 2021-05-14 MED ORDER — ONDANSETRON HCL 4 MG PO TABS: 4.0000 mg | ORAL_TABLET | Freq: Four times a day (QID) | ORAL | Status: DC | PRN

## 2021-05-14 MED ORDER — NA SULFATE-K SULFATE-MG SULF 17.5-3.13-1.6 GM/177ML PO SOLN: 1.0000 | ORAL | Status: DC

## 2021-05-14 MED ORDER — ATORVASTATIN CALCIUM 40 MG PO TABS
80.0000 mg | ORAL_TABLET | Freq: Every day | ORAL | Status: DC
  Administered 2021-05-14 – 2021-05-15 (×2): 80 mg via ORAL
  Filled 2021-05-14 (×3): qty 2

## 2021-05-14 MED ORDER — ALBUTEROL SULFATE HFA 108 (90 BASE) MCG/ACT IN AERS: 2.0000 | INHALATION_SPRAY | RESPIRATORY_TRACT | Status: DC | PRN

## 2021-05-14 MED ORDER — GABAPENTIN 100 MG PO CAPS
100.0000 mg | ORAL_CAPSULE | Freq: Three times a day (TID) | ORAL | Status: DC
  Administered 2021-05-14 – 2021-05-16 (×6): 100 mg via ORAL
  Filled 2021-05-14 (×6): qty 1

## 2021-05-14 MED ORDER — HEPARIN SODIUM (PORCINE) 1000 UNIT/ML DIALYSIS
40.0000 [IU]/kg | INTRAMUSCULAR | Status: DC | PRN
  Administered 2021-05-15: 5200 [IU] via INTRAVENOUS_CENTRAL
  Filled 2021-05-14 (×3): qty 6

## 2021-05-14 MED ORDER — SEVELAMER CARBONATE 800 MG PO TABS
2400.0000 mg | ORAL_TABLET | Freq: Three times a day (TID) | ORAL | Status: DC
  Administered 2021-05-15 – 2021-05-16 (×4): 2400 mg via ORAL
  Filled 2021-05-14 (×4): qty 3

## 2021-05-14 MED ORDER — EZETIMIBE 10 MG PO TABS
10.0000 mg | ORAL_TABLET | Freq: Every evening | ORAL | Status: DC
  Administered 2021-05-14 – 2021-05-15 (×2): 10 mg via ORAL
  Filled 2021-05-14 (×2): qty 1

## 2021-05-14 MED ORDER — LIDOCAINE HCL (PF) 1 % IJ SOLN: 5.0000 mL | INTRAMUSCULAR | Status: DC | PRN

## 2021-05-14 MED ORDER — LORAZEPAM 2 MG/ML IJ SOLN
0.5000 mg | Freq: Once | INTRAMUSCULAR | Status: AC
  Administered 2021-05-14: 0.5 mg via INTRAVENOUS

## 2021-05-14 MED ORDER — SODIUM CHLORIDE 0.9 % IV SOLN: 250.0000 mL | INTRAVENOUS | Status: DC | PRN

## 2021-05-14 MED ORDER — ALLOPURINOL 100 MG PO TABS
200.0000 mg | ORAL_TABLET | Freq: Every day | ORAL | Status: DC
  Administered 2021-05-14 – 2021-05-15 (×2): 200 mg via ORAL
  Filled 2021-05-14 (×3): qty 2

## 2021-05-14 MED ORDER — INSULIN ASPART 100 UNIT/ML IJ SOLN
0.0000 [IU] | Freq: Every day | INTRAMUSCULAR | Status: DC
  Administered 2021-05-14: 3 [IU] via SUBCUTANEOUS
  Administered 2021-05-15: 2 [IU] via SUBCUTANEOUS

## 2021-05-14 MED ORDER — LABETALOL HCL 5 MG/ML IV SOLN: 10.0000 mg | INTRAVENOUS | Status: DC | PRN

## 2021-05-14 MED ORDER — CARVEDILOL 12.5 MG PO TABS
25.0000 mg | ORAL_TABLET | Freq: Two times a day (BID) | ORAL | Status: DC
  Administered 2021-05-14 – 2021-05-15 (×2): 25 mg via ORAL
  Filled 2021-05-14 (×2): qty 2

## 2021-05-14 MED ORDER — FUROSEMIDE 80 MG PO TABS
80.0000 mg | ORAL_TABLET | Freq: Every day | ORAL | Status: DC
  Administered 2021-05-14 – 2021-05-15 (×2): 80 mg via ORAL
  Filled 2021-05-14 (×3): qty 1

## 2021-05-14 MED ORDER — LIDOCAINE-PRILOCAINE 2.5-2.5 % EX CREA: 1.0000 "application " | TOPICAL_CREAM | CUTANEOUS | Status: DC | PRN

## 2021-05-14 MED ORDER — ADULT MULTIVITAMIN W/MINERALS CH
1.0000 | ORAL_TABLET | Freq: Every day | ORAL | Status: DC
  Administered 2021-05-14 – 2021-05-15 (×2): 1 via ORAL
  Filled 2021-05-14 (×2): qty 1

## 2021-05-14 MED ORDER — POLYETHYLENE GLYCOL 3350 17 G PO PACK: 17.0000 g | PACK | Freq: Every day | ORAL | Status: DC | PRN

## 2021-05-14 NOTE — H&P (Signed)
Patient Demographics:    Kelly Key, is a 50 y.o. male  MRN: [id number]   DOB - [birth year]  Admit Date - 05/14/2021  Outpatient Primary MD for the patient is [name], MD   Assessment & Plan:    Principal Problem:   Acute respiratory failure with hypoxia and hypercapnia ([place]) Active Problems:   ESRD on dialysis [facility])   Uncontrolled type 2 diabetes mellitus with hyperglycemia ([place])   Essential hypertension, benign   Morbid obesity ([place])   Acute on chronic diastolic CHF (congestive heart failure) (HCC)   Chronic combined systolic and diastolic CHF (congestive heart failure) (HCC)  1) acute hypoxic and hypercapnic respiratory failure--- patient admits to not compliant with fluid restriction over the weekend -Patient with severely elevated BP on admission ( at 260/150) -Chest x-ray with interstitial edema and cardiomegaly -Clinically presenting with flash pulmonary edema -ABG reflective of uncompensated respiratory acidosis -ABG with a PCO2 of 50 PO2 of 79 pH of 7.278 on FiO2 of 50% patient was placed on BiPAP -COVID-negative -- Update echo  2) hypertensive crisis with flash pulmonary edema--- BP at 260/150 by EMS, repeat BP here 226/144 -Responded well to IV nitro drip and hemodialysis, as well as Versed and lorazepam -Overall BP improving with above measures -Okay to wean off IV nitro drip and restart amlodipine 10 mg daily, Coreg 25 mg twice daily, Lasix 80 mg daily, clonidine 0.3 mg 3 times daily -  3)HFpEF--Echo from 06/16/2019 with EF of 50 to 55%, -Patient being admitted with flash pulmonary edema in setting of uncontrolled BP noncompliance with fluid restriction  -Management as above #1 -Repeat Echo requested - troponin is 24, -EKG with sinus tach up to 125, no acute ischemic changes--- doubt  ACS  4)ESRD--- T/T/S--- patient admits to noncompliance with fluid restriction over the weekend --Sodium was 127, potassium is 4.4 creatinine 9.25 with a BUN of 99 and a glucose of 342 -Nephrology input from Dr. Posey Pronto appreciated with plans for emergent hemodialysis to address volume status -Anticipate that patient will require serial/back-to-back hemodialysis sessions to address volume status and hypoxia  5)DM2-blood glucose 342 -Recent A1c 8.1 reflecting uncontrolled diabetes with hyperglycemia PTA -Use Novolog/Humalog Sliding scale insulin with Accu-Cheks/Fingersticks as ordered   6) chronic anemia of ESRD--- hemoglobin 11.4 which is close to baseline, continue to monitor -EPO/ESA agent per nephrology team  7) leukocytosis--- WBC 13.9, no cough, no fevers, chest x-ray consistent with interstitial edema, however cannot rule out pneumonitis, -Repeat chest x-ray on 05/15/2021. after hemodialysis -Hold off on antibiotics at this time  8) hyponatremia--- sodium is 127,  repeat BMP after back-to-back hemodialysis sessions  Disposition/Need for in-Hospital Stay- patient unable to be discharged at this time due to acute hypoxic and hypercapnic respiratory failure in the setting of hypertensive crisis leading to flash pulm edema requiring urgent hemodialysis and IV nitro drip for BP control---Anticipate that patient will require serial/back-to-back hemodialysis sessions to address volume status and hypoxia  Status is: Inpatient  Remains inpatient appropriate because: Please see disposition above  Dispo: The patient is from: Home              Anticipated d/c is to: Home              Anticipated d/c date is: 2 days              Patient currently is not medically stable to d/c. Barriers: Not Clinically Stable-   With History of - Reviewed by me  Past Medical History:  Diagnosis Date   Acute diastolic CHF (congestive heart failure) ([place]) 06/11/2012   EF 50-55% [facility])   Chronic  kidney disease    COPD (chronic obstructive pulmonary disease) (HCC)    Diabetes mellitus    A1c 11.5 (06/11/2012).   Dyspnea    Gout    Hepatic steatosis 06/11/2012   Elevated LFTs   Hyperlipemia    Malignant hypertension    Microcytic anemia 06/12/2012   Obesity       Past Surgical History:  Procedure Laterality Date   AV FISTULA PLACEMENT Left 11/11/2019   Procedure: ARTERIOVENOUS (AV) FISTULA CREATION LEFT BRACHIOCEPHALIC ARM;  Surgeon: Serafina Mitchell, MD;  Location: [facility] OR;  Service: Vascular;  Laterality: Left;   IR FLUORO GUIDE CV LINE RIGHT  07/08/2019   IR REMOVAL TUN CV CATH W/O FL  03/23/2020   IR US GUIDE VASC ACCESS RIGHT  07/08/2019   NO PAST SURGERIES     VASCULAR SURGERY        Chief Complaint  Patient presents with   Respiratory Distress      HPI:    Kelly Key  is a 50 y.o. male with past medical history relevant for ESRD on hemodialysis Tuesdays Thursdays and Saturdays, malignant hypertension, HLD, morbid obesity, combined systolic and diastolic dysfunction CHF, COPD and diabetes mellitus who presents by EMS with shortness of breath -Patient has had shortness of breath for the last couple of days, got worse this morning, he attempted to go to his hemodialysis center--- while at hemodialysis center prior to initiation of HD he became more short of breath and hypoxic EMS was called to the HD center EMS placed patient on CPAP, gave nitro x3, EMS also gave Versed -BP was elevated at 260/150 No fever  Or chills   No Nausea, Vomiting or Diarrhea -Denies frank chest pain In ED troponin is 24, COVID-negative -EKG with sinus tach up to 125, no acute ischemic changes -Chest x-ray with cardiomegaly and interstitial edema , pneumonitis not excluded -WBC 13.9, hemoglobin is 11.4 which is close to baseline -ABG with a PCO2 of 50 PO2 of 79 pH of 7.278 on FiO2 of 50% patient was placed on BiPAP -Sodium was 127, potassium is 4.4 creatinine 9.25 with a BUN of 99 and a glucose  of 342 -Nephrology input from Dr. Posey Pronto appreciated with plans for emergent hemodialysis to address volume status   Review of systems:    In addition to the HPI above,   A full Review of  Systems was done, all other systems reviewed are negative except as noted above in HPI , .    Social History:  Reviewed by me    Social History   Tobacco Use   Smoking status: Never   Smokeless tobacco: Never  Substance Use Topics   Alcohol use: Not Currently    Comment: haven't drank in over 5 years       Family History :  Reviewed by me    Family History  Problem Relation Age of Onset   Gout Mother    Asthma Mother    Diabetes Father    Heart failure Father    Diabetes Sister    Hypertension Brother    Pancreatic cancer Brother    Diabetes Sister      Home Medications:   Prior to Admission medications   Medication Sig Start Date End Date Taking? Authorizing Provider  acetaminophen (TYLENOL) 500 MG tablet Take 1,000 mg by mouth every 6 (six) hours as needed for moderate pain or headache.    [provider]  albuterol (VENTOLIN HFA) 108 (90 Base) MCG/ACT inhaler Inhale 2 puffs into the lungs every 4 (four) hours as needed for wheezing or shortness of breath.  03/24/19   [provider]  allopurinol (ZYLOPRIM) 100 MG tablet Take 2 tablets (200 mg total) by mouth daily. 08/04/19   Love, Ivan Anchors, PA-C  amLODipine (NORVASC) 5 MG tablet Take 1 tablet (5 mg total) by mouth daily. 08/03/19   Love, Ivan Anchors, PA-C  aspirin EC 81 MG EC tablet Take 1 tablet (81 mg total) by mouth daily. 08/04/19   Love, Ivan Anchors, PA-C  atorvastatin (LIPITOR) 80 MG tablet Take 1 tablet (80 mg total) by mouth daily. 12/05/19   Arnoldo Lenis, MD  Calcium Carbonate Antacid (CALCIUM CARBONATE, DOSED IN MG ELEMENTAL CALCIUM,) 1250 MG/5ML SUSP Take 5 mLs (500 mg of elemental calcium total) by mouth every 6 (six) hours as needed for indigestion. 07/16/19   Amin, Jeanella Flattery, MD  carvedilol (COREG)  25 MG tablet Take 1 tablet (25 mg total) by mouth 2 (two) times daily with a meal. 08/03/19   Love, Ivan Anchors, PA-C  cephALEXin (KEFLEX) 500 MG capsule Take 1 capsule (500 mg total) by mouth 4 (four) times daily. 12/15/20   Triplett, Tammy, PA-C  cloNIDine (CATAPRES) 0.3 MG tablet Take 1 tablet (0.3 mg total) by mouth 3 (three) times daily. 08/03/19 08/22/20  Love, Ivan Anchors, PA-C  diclofenac sodium (VOLTAREN) 1 % GEL Apply 2 g topically 4 (four) times daily. Patient taking differently: Apply 2 g topically 4 (four) times daily as needed (pain). 08/03/19   Love, Ivan Anchors, PA-C  ezetimibe (ZETIA) 10 MG tablet Take 10 mg by mouth every evening. 01/20/19   [provider]  fluticasone (FLONASE) 50 MCG/ACT nasal spray Place 1 spray into both nostrils daily. Patient taking differently: Place 1 spray into both nostrils daily as needed for allergies. 04/04/18 08/22/20  Manuella Ghazi, Pratik D, DO  furosemide (LASIX) 80 MG tablet Take 80 mg by mouth daily. 10/11/19   [provider]  gabapentin (NEURONTIN) 100 MG capsule Take 1 capsule (100 mg total) by mouth 3 (three) times daily. 08/03/19   Love, Ivan Anchors, PA-C  glucose blood (ACCU-CHEK AVIVA PLUS) test strip Use as instructed TO CHECK BLOOD GLUCOSE THREE TIMES DAILY 08/15/20   Nida, Marella Chimes, MD  HYDROcodone-acetaminophen (NORCO/VICODIN) 5-325 MG tablet Take 1 tablet by mouth every 6 (six) hours as needed for severe pain. 11/11/19   Dagoberto Ligas, PA-C  insulin detemir (LEVEMIR FLEXTOUCH) 100 UNIT/ML FlexPen Inject 50 Units into the skin at bedtime. 03/11/21   Cassandria Anger, MD  Insulin Pen Needle (B-D ULTRAFINE III SHORT PEN) 31G X 8 MM MISC 1 each by Does not apply route as directed. 08/13/20   Cassandria Anger, MD  linaclotide (LINZESS) 145 MCG CAPS capsule Take 1 capsule (145 mcg total) by mouth  daily before breakfast. 04/05/18   Manuella Ghazi, Pratik D, DO  multivitamin (RENA-VIT) TABS tablet Take 1 tablet by mouth at bedtime. 08/03/19   Love,  Ivan Anchors, PA-C  Na Sulfate-K Sulfate-Mg Sulf (SUPREP BOWEL PREP KIT) 17.5-3.13-1.6 GM/177ML SOLN Take 1 kit by mouth as directed. 04/11/21   Lucilla Lame, MD  NOVOLOG FLEXPEN 100 UNIT/ML FlexPen INJECT 8 TO 14 UNITS SUBCUTANEOUSLY THREE TIMES DAILY WITH MEALS 03/11/21   Cassandria Anger, MD  oxyCODONE-acetaminophen (PERCOCET) 5-325 MG tablet Take 1-2 tablets by mouth every 6 (six) hours as needed. 04/24/21   Veryl Speak, MD  OZEMPIC, 0.25 OR 0.5 MG/DOSE, 2 MG/1.5ML SOPN Inject 0.25 mg into the skin once a week. 11/23/20   [provider]  predniSONE (DELTASONE) 10 MG tablet Take 2 tablets (20 mg total) by mouth 2 (two) times daily. 04/24/21   Veryl Speak, MD  sevelamer carbonate (RENVELA) 800 MG tablet Take 3 tablets (2,400 mg total) by mouth 3 (three) times daily with meals. 08/03/19   Love, Ivan Anchors, PA-C  valACYclovir (VALTREX) 1000 MG tablet Take 1 tablet (1,000 mg total) by mouth 3 (three) times daily. 04/24/21   Veryl Speak, MD     Allergies:     Allergies  Allergen Reactions   Lisinopril Swelling   Hydralazine Anxiety    Hallucinations      Physical Exam:   Vitals  Blood pressure (!) 181/100, pulse 84, temperature (!) 97.5 F (36.4 C), temperature source Axillary, resp. rate 18, height 6' (1.829 m), weight (!) 139.4 kg, SpO2 97 %.   Temp:  [97 F (36.1 C)-97.5 F (36.4 C)] 97.5 F (36.4 C) (07/19 1145) Pulse Rate:  [84-120] 84 (07/19 1745) Resp:  [14-25] 18 (07/19 1745) BP: (98-226)/(58-144) 181/100 (07/19 1821) SpO2:  [95 %-100 %] 97 % (07/19 1745) FiO2 (%):  [50 %] 50 % (07/19 0755) Weight:  [130 kg-139.4 kg] 139.4 kg (07/19 1315)   Physical Examination: General appearance - alert, morbidly obese in acute respiratory distress-unable to talk much Mental status - alert, oriented to person, place, and time,  Mouth--BiPAP mask Eyes - sclera anicteric Neck - supple, ++ve JVD elevation , Chest -diminished breath sounds, bibasilar rales  heart - S1 and S2  normal, regular  Abdomen - soft, nontender, nondistended, increased truncal adiposity Neurological - screening mental status exam normal, neck supple without rigidity, cranial nerves II through XII intact, DTR's normal and symmetric Extremities - no pedal edema noted, intact peripheral pulses  Skin - warm, dry MSK-left arm AV fistula with positive thrill and bruit     Data Review:    CBC Recent Labs  Lab 05/14/21 0814  WBC 13.9*  HGB 11.4*  HCT 36.2*  PLT 287  MCV 92.1  MCH 29.0  MCHC 31.5  RDW 15.9*   ------------------------------------------------------------------------------------------------------------------  Chemistries  Recent Labs  Lab 05/14/21 0814  NA 127*  K 4.4  CL 90*  CO2 23  GLUCOSE 342*  BUN 99*  CREATININE 9.25*  CALCIUM 8.6*   ------------------------------------------------------------------------------------------------------------------ estimated creatinine clearance is 13.5 mL/min (A) (by C-G formula based on SCr of 9.25 mg/dL (H)). ------------------------------------------------------------------------------------------------------------------ No results for input(s): TSH, T4TOTAL, T3FREE, THYROIDAB in the last 72 hours.  Invalid input(s): FREET3   Coagulation profile No results for input(s): INR, PROTIME in the last 168 hours. ------------------------------------------------------------------------------------------------------------------- No results for input(s): DDIMER in the last 72 hours. -------------------------------------------------------------------------------------------------------------------  Cardiac Enzymes No results for input(s): CKMB, TROPONINI, MYOGLOBIN in the last 168 hours.  Invalid input(s): CK ------------------------------------------------------------------------------------------------------------------  Component Value Date/Time   BNP 208.0 (H) 04/20/2021 1216      ---------------------------------------------------------------------------------------------------------------  Urinalysis    Component Value Date/Time   COLORURINE STRAW (A) 04/20/2021 1740   APPEARANCEUR CLEAR 04/20/2021 1740   LABSPEC 1.011 04/20/2021 1740   PHURINE 8.0 04/20/2021 1740   GLUCOSEU 150 (A) 04/20/2021 1740   HGBUR NEGATIVE 04/20/2021 1740   BILIRUBINUR NEGATIVE 04/20/2021 1740   KETONESUR NEGATIVE 04/20/2021 1740   PROTEINUR 100 (A) 04/20/2021 1740   UROBILINOGEN 0.2 06/11/2012 0518   NITRITE NEGATIVE 04/20/2021 1740   LEUKOCYTESUR NEGATIVE 04/20/2021 1740    ----------------------------------------------------------------------------------------------------------------   Imaging Results:    DG Chest Port 1 View  Result Date: 05/14/2021 CLINICAL DATA:  Shortness of breath. EXAM: PORTABLE CHEST 1 VIEW COMPARISON:  04/20/2021. FINDINGS: Cardiomegaly with diffuse bilateral interstitial prominence consistent with interstitial edema. Pneumonitis cannot be excluded. Low lung volumes. No pleural effusion or pneumothorax. IMPRESSION: 1. Cardiomegaly with diffuse bilateral interstitial prominence consistent interstitial edema. Pneumonitis cannot be excluded. 2.  Low lung volumes. Electronically Signed   By: Marcello Moores  Register   On: 05/14/2021 08:34    Radiological Exams on Admission: DG Chest Port 1 View  Result Date: 05/14/2021 CLINICAL DATA:  Shortness of breath. EXAM: PORTABLE CHEST 1 VIEW COMPARISON:  04/20/2021. FINDINGS: Cardiomegaly with diffuse bilateral interstitial prominence consistent with interstitial edema. Pneumonitis cannot be excluded. Low lung volumes. No pleural effusion or pneumothorax. IMPRESSION: 1. Cardiomegaly with diffuse bilateral interstitial prominence consistent interstitial edema. Pneumonitis cannot be excluded. 2.  Low lung volumes. Electronically Signed   By: Marcello Moores  Register   On: 05/14/2021 08:34    DVT Prophylaxis -SCD /heparin AM  Labs Ordered, also please review Full Orders  Family Communication: Admission, patients condition and plan of care including tests being ordered have been discussed with the patient  who indicate understanding and agree with the plan   Code Status - Full Code  Likely DC to home after resolution of flash pulmonary edema  Condition   fair  Roxan Hockey M.D on 05/14/2021 at 6:22 PM Go to [email or web address] -  for contact info  [facility] - Office  [phone]

## 2021-05-14 NOTE — ED Triage Notes (Signed)
Pt brought to ED via [facility] emergency traffic for SOB started this am at 0430. Pt went to dialysis, sat in chair went into flash pulmonary edema. EMS called, given versed 2 mg for agitation, 3 nitro PTA, cpap started. 20 g to R hand. Initial BP 260/150. Dr Tomi Bamberger at bedside.

## 2021-05-14 NOTE — Plan of Care (Signed)

## 2021-05-14 NOTE — Procedures (Signed)
   HEMODIALYSIS TREATMENT NOTE:  4-hour HD session completed via left upper arm AVF (15g/antegrade).  Goal met: 4.5 liters removed with minimal interruption in ultrafiltration.  Weaned off of NTG infusion and Bi-PAP.  Saturating >96% on 4L O2 via [place].  All blood was returned and hemostasis was achieved in 15 minutes.    Rockwell Alexandria, RN

## 2021-05-14 NOTE — ED Provider Notes (Signed)
[facility] EMERGENCY DEPARTMENT Provider Note   CSN: [id number] Arrival date & time: 05/14/21  0746     History Chief Complaint  Patient presents with   Respiratory Distress    Kelly Key is a 50 y.o. male.  HPI  Patient presents to the ED with complaints of acute shortness of breath.  Patient came in via ambulance.  Patient is a dialysis patient.  He goes to dialysis Tuesday Thursday Saturday.  Started having shortness of breath about 4:30 AM.  He went to dialysis and while he was there he became acutely more short of breath.  Patient complained of extreme shortness of breath and was not able to get comfortable.  EMS noted him to be in distress.  He was given nitroglycerin and started on CPAP.  Patient was also given 2 mg for  Versed to help calm him down in order transport.  Patient has noticed some improvement with his treatment.  Initial blood pressure by EMS was 260/150.  Patient denies any chest pain.  He denies any fevers or chills.  Past Medical History:  Diagnosis Date   Acute diastolic CHF (congestive heart failure) ([place]) 06/11/2012   EF 50-55% [facility])   Chronic kidney disease    COPD (chronic obstructive pulmonary disease) (HCC)    Diabetes mellitus    A1c 11.5 (06/11/2012).   Dyspnea    Gout    Hepatic steatosis 06/11/2012   Elevated LFTs   Hyperlipemia    Malignant hypertension    Microcytic anemia 06/12/2012   Obesity     Patient Active Problem List   Diagnosis Date Noted   Mixed hyperlipidemia 08/13/2020   Acute gout of right knee 08/03/2019   Orthostasis    Muscle strain of right wrist    Pain    Physical debility 07/16/2019   ESRD on dialysis [facility])    Anemia of chronic disease    Uncontrolled type 2 diabetes mellitus with hyperglycemia (HCC)    Congestive heart failure (HCC)    Volume overload 07/05/2019   Anasarca 07/03/2019   Dyslipidemia 07/03/2019   Chronic combined systolic and diastolic CHF (congestive heart failure) ([place])  07/03/2019   Bilateral leg edema    CKD (chronic kidney disease) stage 5, GFR less than 15 ml/min ([place]) 06/22/2019   Uncontrolled type 2 diabetes mellitus with hyperglycemia, with long-term current use of insulin ([place]) 06/22/2019   Hypokalemia    Thromboembolism ([place]) 02/20/2019   AKI (acute kidney injury) ([place]) 02/20/2019   Acute on chronic diastolic CHF (congestive heart failure) ([place]) 03/26/2018   Acute respiratory failure with hypoxia ([place]) 03/26/2018   Uncontrolled type 2 diabetes mellitus with stage 5 chronic kidney disease ([place]) 06/12/2012   Hyponatremia 06/12/2012   Microcytic anemia 06/12/2012   Essential hypertension, benign 06/11/2012   Elevated LFTs 06/11/2012   Morbid obesity ([place]) 06/11/2012   Hepatic steatosis 06/11/2012    Past Surgical History:  Procedure Laterality Date   AV FISTULA PLACEMENT Left 11/11/2019   Procedure: ARTERIOVENOUS (AV) FISTULA CREATION LEFT BRACHIOCEPHALIC ARM;  Surgeon: Serafina Mitchell, MD;  Location: [facility] OR;  Service: Vascular;  Laterality: Left;   IR FLUORO GUIDE CV LINE RIGHT  07/08/2019   IR REMOVAL TUN CV CATH W/O FL  03/23/2020   IR US GUIDE VASC ACCESS RIGHT  07/08/2019   NO PAST SURGERIES     VASCULAR SURGERY         Family History  Problem Relation Age of Onset   Gout Mother  Asthma Mother    Diabetes Father    Heart failure Father    Diabetes Sister    Hypertension Brother    Pancreatic cancer Brother    Diabetes Sister     Social History   Tobacco Use   Smoking status: Never   Smokeless tobacco: Never  Vaping Use   Vaping Use: Never used  Substance Use Topics   Alcohol use: Not Currently    Comment: haven't drank in over 5 years    Drug use: No    Home Medications Prior to Admission medications   Medication Sig Start Date End Date Taking? Authorizing Provider  acetaminophen (TYLENOL) 500 MG tablet Take 1,000 mg by mouth every 6 (six) hours as needed for moderate pain or headache.    [provider]   albuterol (VENTOLIN HFA) 108 (90 Base) MCG/ACT inhaler Inhale 2 puffs into the lungs every 4 (four) hours as needed for wheezing or shortness of breath.  03/24/19   [provider]  allopurinol (ZYLOPRIM) 100 MG tablet Take 2 tablets (200 mg total) by mouth daily. 08/04/19   Love, Ivan Anchors, PA-C  amLODipine (NORVASC) 5 MG tablet Take 1 tablet (5 mg total) by mouth daily. 08/03/19   Love, Ivan Anchors, PA-C  aspirin EC 81 MG EC tablet Take 1 tablet (81 mg total) by mouth daily. 08/04/19   Love, Ivan Anchors, PA-C  atorvastatin (LIPITOR) 80 MG tablet Take 1 tablet (80 mg total) by mouth daily. 12/05/19   Arnoldo Lenis, MD  Calcium Carbonate Antacid (CALCIUM CARBONATE, DOSED IN MG ELEMENTAL CALCIUM,) 1250 MG/5ML SUSP Take 5 mLs (500 mg of elemental calcium total) by mouth every 6 (six) hours as needed for indigestion. 07/16/19   Amin, Jeanella Flattery, MD  carvedilol (COREG) 25 MG tablet Take 1 tablet (25 mg total) by mouth 2 (two) times daily with a meal. 08/03/19   Love, Ivan Anchors, PA-C  cephALEXin (KEFLEX) 500 MG capsule Take 1 capsule (500 mg total) by mouth 4 (four) times daily. 12/15/20   Triplett, Tammy, PA-C  cloNIDine (CATAPRES) 0.3 MG tablet Take 1 tablet (0.3 mg total) by mouth 3 (three) times daily. 08/03/19 08/22/20  Love, Ivan Anchors, PA-C  diclofenac sodium (VOLTAREN) 1 % GEL Apply 2 g topically 4 (four) times daily. Patient taking differently: Apply 2 g topically 4 (four) times daily as needed (pain). 08/03/19   Love, Ivan Anchors, PA-C  ezetimibe (ZETIA) 10 MG tablet Take 10 mg by mouth every evening. 01/20/19   [provider]  fluticasone (FLONASE) 50 MCG/ACT nasal spray Place 1 spray into both nostrils daily. Patient taking differently: Place 1 spray into both nostrils daily as needed for allergies. 04/04/18 08/22/20  Manuella Ghazi, Pratik D, DO  furosemide (LASIX) 80 MG tablet Take 80 mg by mouth daily. 10/11/19   [provider]  gabapentin (NEURONTIN) 100 MG capsule Take 1 capsule (100 mg  total) by mouth 3 (three) times daily. 08/03/19   Love, Ivan Anchors, PA-C  glucose blood (ACCU-CHEK AVIVA PLUS) test strip Use as instructed TO CHECK BLOOD GLUCOSE THREE TIMES DAILY 08/15/20   Nida, Marella Chimes, MD  HYDROcodone-acetaminophen (NORCO/VICODIN) 5-325 MG tablet Take 1 tablet by mouth every 6 (six) hours as needed for severe pain. 11/11/19   Dagoberto Ligas, PA-C  insulin detemir (LEVEMIR FLEXTOUCH) 100 UNIT/ML FlexPen Inject 50 Units into the skin at bedtime. 03/11/21   Cassandria Anger, MD  Insulin Pen Needle (B-D ULTRAFINE III SHORT PEN) 31G X 8 MM MISC  1 each by Does not apply route as directed. 08/13/20   Cassandria Anger, MD  linaclotide Rolan Lipa) 145 MCG CAPS capsule Take 1 capsule (145 mcg total) by mouth daily before breakfast. 04/05/18   Manuella Ghazi, Pratik D, DO  multivitamin (RENA-VIT) TABS tablet Take 1 tablet by mouth at bedtime. 08/03/19   Love, Ivan Anchors, PA-C  Na Sulfate-K Sulfate-Mg Sulf (SUPREP BOWEL PREP KIT) 17.5-3.13-1.6 GM/177ML SOLN Take 1 kit by mouth as directed. 04/11/21   Lucilla Lame, MD  NOVOLOG FLEXPEN 100 UNIT/ML FlexPen INJECT 8 TO 14 UNITS SUBCUTANEOUSLY THREE TIMES DAILY WITH MEALS 03/11/21   Cassandria Anger, MD  oxyCODONE-acetaminophen (PERCOCET) 5-325 MG tablet Take 1-2 tablets by mouth every 6 (six) hours as needed. 04/24/21   Veryl Speak, MD  OZEMPIC, 0.25 OR 0.5 MG/DOSE, 2 MG/1.5ML SOPN Inject 0.25 mg into the skin once a week. 11/23/20   [provider]  predniSONE (DELTASONE) 10 MG tablet Take 2 tablets (20 mg total) by mouth 2 (two) times daily. 04/24/21   Veryl Speak, MD  sevelamer carbonate (RENVELA) 800 MG tablet Take 3 tablets (2,400 mg total) by mouth 3 (three) times daily with meals. 08/03/19   Love, Ivan Anchors, PA-C  valACYclovir (VALTREX) 1000 MG tablet Take 1 tablet (1,000 mg total) by mouth 3 (three) times daily. 04/24/21   Veryl Speak, MD    Allergies    Lisinopril and Hydralazine  Review of Systems   Review of Systems   All other systems reviewed and are negative.  Physical Exam Updated Vital Signs BP (!) 187/116   Pulse (!) 102   Temp (!) 97 F (36.1 C) (Axillary)   Resp (!) 21   Ht 1.854 m (6' 1" )   Wt 130 kg   SpO2 100%   BMI 37.81 kg/m   Physical Exam Vitals and nursing note reviewed.  Constitutional:      General: He is in acute distress.     Appearance: He is well-developed. He is ill-appearing.  HENT:     Head: Normocephalic and atraumatic.     Right Ear: External ear normal.     Left Ear: External ear normal.  Eyes:     General: No scleral icterus.       Right eye: No discharge.        Left eye: No discharge.     Conjunctiva/sclera: Conjunctivae normal.  Neck:     Trachea: No tracheal deviation.  Cardiovascular:     Rate and Rhythm: Normal rate and regular rhythm.  Pulmonary:     Effort: Respiratory distress present.     Breath sounds: Normal breath sounds. No stridor. No wheezing or rales.  Abdominal:     General: Bowel sounds are normal. There is no distension.     Palpations: Abdomen is soft.     Tenderness: There is no abdominal tenderness. There is no guarding or rebound.  Musculoskeletal:        General: No tenderness or deformity.     Cervical back: Neck supple.     Right lower leg: Edema present.     Left lower leg: Edema present.  Skin:    General: Skin is warm and dry.     Findings: No rash.  Neurological:     General: No focal deficit present.     Mental Status: He is alert.     Cranial Nerves: No cranial nerve deficit (no facial droop, extraocular movements intact, no slurred speech).     Sensory: No sensory  deficit.     Motor: No abnormal muscle tone or seizure activity.     Coordination: Coordination normal.  Psychiatric:        Mood and Affect: Mood normal.    ED Results / Procedures / Treatments   Labs (all labs ordered are listed, but only abnormal results are displayed) Labs Reviewed  BASIC METABOLIC PANEL - Abnormal; Notable for the  following components:      Result Value   Sodium 127 (*)    Chloride 90 (*)    Glucose, Bld 342 (*)    BUN 99 (*)    Creatinine, Ser 9.25 (*)    Calcium 8.6 (*)    GFR, Estimated 6 (*)    All other components within normal limits  CBC - Abnormal; Notable for the following components:   WBC 13.9 (*)    RBC 3.93 (*)    Hemoglobin 11.4 (*)    HCT 36.2 (*)    RDW 15.9 (*)    All other components within normal limits  BLOOD GAS, ARTERIAL - Abnormal; Notable for the following components:   pH, Arterial 7.278 (*)    pCO2 arterial 50.1 (*)    pO2, Arterial 79.0 (*)    Acid-base deficit 3.0 (*)    All other components within normal limits  TROPONIN I (HIGH SENSITIVITY) - Abnormal; Notable for the following components:   Troponin I (High Sensitivity) 24 (*)    All other components within normal limits  RESP PANEL BY RT-PCR (FLU A&B, COVID) [id number]  TROPONIN I (HIGH SENSITIVITY)    EKG EKG Interpretation  Date/Time:  Tuesday May 14 2021 07:55:04 EDT Ventricular Rate:  125 PR Interval:    QRS Duration: 102 QT Interval:  372 QTC Calculation: 537 R Axis:   -4 Text Interpretation: Junctional tachycardia vs sinus tachycardia Probable anteroseptal infarct, old Prolonged QT interval Since last tracing rate faster Confirmed by Dorie Rank [phone]) on 05/14/2021 8:04:55 AM  Radiology DG Chest Port 1 View  Result Date: 05/14/2021 CLINICAL DATA:  Shortness of breath. EXAM: PORTABLE CHEST 1 VIEW COMPARISON:  04/20/2021. FINDINGS: Cardiomegaly with diffuse bilateral interstitial prominence consistent with interstitial edema. Pneumonitis cannot be excluded. Low lung volumes. No pleural effusion or pneumothorax. IMPRESSION: 1. Cardiomegaly with diffuse bilateral interstitial prominence consistent interstitial edema. Pneumonitis cannot be excluded. 2.  Low lung volumes. Electronically Signed   By: Marcello Moores  Register   On: 05/14/2021 08:34    Procedures .Critical Care  Date/Time: 05/14/2021 9:45  AM Performed by: Dorie Rank, MD Authorized by: Dorie Rank, MD   Critical care provider statement:    Critical care time (minutes):  50   Critical care was time spent personally by me on the following activities:  Discussions with consultants, evaluation of patient's response to treatment, examination of patient, ordering and performing treatments and interventions, ordering and review of laboratory studies, ordering and review of radiographic studies, pulse oximetry, re-evaluation of patient's condition, obtaining history from patient or surrogate and review of old charts   Medications Ordered in ED Medications  nitroGLYCERIN 50 mg in dextrose 5 % 250 mL (0.2 mg/mL) infusion (5 mcg/min Intravenous New Bag/Given 05/14/21 0930)    ED Course  I have reviewed the triage vital signs and the nursing notes.  Pertinent labs & imaging results that were available during my care of the patient were reviewed by me and considered in my medical decision making (see chart for details).  Clinical Course as of 05/14/21 0945  Tue May 14, 2021  0925 Labs notable for hyperglycemia and elevated BUN/creatinine [JK]  0925 Troponin elevated 24 [JK]  0925 Hemoglobin stable 11.4 [JK]  0925 Component of respiratory acidosis with elevated PCO2 and decreased pH [JK]  0926 Chest x-ray consistent with pulmonary edema [JK]  0926 Patient currently on a nitro drip.  We will titrate to assist with his blood pressure.  Work-up is consistent with pulmonary edema.  Patient will require dialysis.  I will consult with nephrology on-call [JK]  [phone] Discussed with Dr. Posey Pronto, nephrology.  He will come see the patient in the ED [JK]  0943 Patient's mentation is stable.  He is alert and awake.  Does feel better with the BiPAP.  Nitroglycerin is getting started right now [JK]    Clinical Course User Index [JK] Dorie Rank, MD   MDM Rules/Calculators/A&P                          Patient presented to the ED with acute respiratory  distress.  Patient has history of chronic renal failure and was post to get dialysis today.  EMS noted the patient to be in severe respiratory distress.  They initiated CPAP as well as nitroglycerin.  Patient had improvement of his blood pressure and breathing difficulty although was still in significant distress.  In the ED his oxygen saturation was 100% after their treatment.  He was continued on BiPAP.  ABG does show a component of respiratory acidosis but he appears to be responding to treatment and I think we can hold off on intubation right now.  Patient's laboratory tests are consistent with pulmonary edema.  He is currently on a nitroglycerin drip.  I spoken with Dr. Posey Pronto nephrology and he will evaluate the patient regarding his need for dialysis and pulmonary edema.  We will continue to monitor closely Final Clinical Impression(s) / ED Diagnoses Final diagnoses:  Acute pulmonary edema ([place])  Stage 5 chronic kidney disease on chronic dialysis [facility])  Hypertensive emergency     Dorie Rank, MD 05/14/21 0945

## 2021-05-14 NOTE — Consult Note (Signed)
Reason for Consult: Volume overload, malignant hypertension inpatient with end-stage renal disease Referring Physician: Marye Round, MD (EDP)  HPI:  50 year old African-American man with past medical history significant for CHFpEF, hypertension, type 2 diabetes mellitus, dyslipidemia, obesity, chronic obstructive lung disease, gout and end-stage renal disease on hemodialysis on a TTS schedule at the [facility] unit in [place].  He reports some emergence of shortness of breath last night as he tried to lay down to sleep which worsened significantly while awaiting for hemodialysis this morning prompting EMS evaluation who found him to have significant hypoxic respiratory failure and malignant hypertension for which she was placed on CPAP and given nitroglycerin along with some Versed to calm him down for transportation.  He denies any associated fever or chills and reports intermittent dry cough without any hemoptysis/sputum.  He denies any nausea, vomiting or diarrhea and admits that he has "overdone fluids over the weekend".  He denies any dysuria, urgency, frequency, flank pain, fever or chills.  He recalls his estimated dry weight is 286 pounds.  (Surprisingly initial ER weight is the same at 130 kg).  Past Medical History:  Diagnosis Date   Acute diastolic CHF (congestive heart failure) ([place]) 06/11/2012   EF 50-55% [facility])   Chronic kidney disease    COPD (chronic obstructive pulmonary disease) (HCC)    Diabetes mellitus    A1c 11.5 (06/11/2012).   Dyspnea    Gout    Hepatic steatosis 06/11/2012   Elevated LFTs   Hyperlipemia    Malignant hypertension    Microcytic anemia 06/12/2012   Obesity     Past Surgical History:  Procedure Laterality Date   AV FISTULA PLACEMENT Left 11/11/2019   Procedure: ARTERIOVENOUS (AV) FISTULA CREATION LEFT BRACHIOCEPHALIC ARM;  Surgeon: Serafina Mitchell, MD;  Location: [facility] OR;  Service: Vascular;  Laterality: Left;   IR FLUORO GUIDE CV LINE RIGHT   07/08/2019   IR REMOVAL TUN CV CATH W/O FL  03/23/2020   IR US GUIDE VASC ACCESS RIGHT  07/08/2019   NO PAST SURGERIES     VASCULAR SURGERY      Family History  Problem Relation Age of Onset   Gout Mother    Asthma Mother    Diabetes Father    Heart failure Father    Diabetes Sister    Hypertension Brother    Pancreatic cancer Brother    Diabetes Sister     Social History:  reports that he has never smoked. He has never used smokeless tobacco. He reports previous alcohol use. He reports that he does not use drugs.  Allergies:  Allergies  Allergen Reactions   Lisinopril Swelling   Hydralazine Anxiety    Hallucinations     Medications: Scheduled:  bebtelovimab EUA  175 mg Intravenous Once   Chlorhexidine Gluconate Cloth  6 each Topical Q0600    BMP Latest Ref Rng & Units 05/14/2021 04/20/2021 12/15/2020  Glucose 70 - 99 mg/dL 342(H) 225(H) 137(H)  BUN 6 - 20 mg/dL 99(H) 44(H) 29(H)  Creatinine 0.61 - 1.24 mg/dL 9.25(H) 8.08(H) 5.78(H)  Sodium 135 - 145 mmol/L 127(L) 133(L) 137  Potassium 3.5 - 5.1 mmol/L 4.4 4.2 4.2  Chloride 98 - 111 mmol/L 90(L) 91(L) 97(L)  CO2 22 - 32 mmol/L '23 30 30  '$ Calcium 8.9 - 10.3 mg/dL 8.6(L) 8.7(L) 9.1   CBC Latest Ref Rng & Units 05/14/2021 04/20/2021 12/15/2020  WBC 4.0 - 10.5 K/uL 13.9(H) 6.2 8.4  Hemoglobin 13.0 - 17.0 g/dL 11.4(L) 11.0(L) 11.3(L)  Hematocrit 39.0 - 52.0 % 36.2(L) 35.2(L) 35.8(L)  Platelets 150 - 400 K/uL 287 232 276     DG Chest Port 1 View  Result Date: 05/14/2021 CLINICAL DATA:  Shortness of breath. EXAM: PORTABLE CHEST 1 VIEW COMPARISON:  04/20/2021. FINDINGS: Cardiomegaly with diffuse bilateral interstitial prominence consistent with interstitial edema. Pneumonitis cannot be excluded. Low lung volumes. No pleural effusion or pneumothorax. IMPRESSION: 1. Cardiomegaly with diffuse bilateral interstitial prominence consistent interstitial edema. Pneumonitis cannot be excluded. 2.  Low lung volumes. Electronically Signed    By: Marcello Moores  Register   On: 05/14/2021 08:34    Review of Systems  Constitutional:  Positive for activity change and fatigue. Negative for fever.  HENT:  Negative for nosebleeds, sinus pain, sore throat and trouble swallowing.   Eyes:  Negative for photophobia, pain and visual disturbance.  Respiratory:  Positive for cough and shortness of breath. Negative for chest tightness and wheezing.   Cardiovascular:  Negative for chest pain and leg swelling.  Gastrointestinal:  Negative for abdominal pain, blood in stool, diarrhea, nausea and vomiting.  Endocrine: Positive for heat intolerance and polydipsia.  Genitourinary:  Negative for dysuria, flank pain, hematuria and urgency.  Musculoskeletal:  Negative for back pain and myalgias.  Skin:  Negative for rash and wound.  Neurological:  Positive for headaches. Negative for weakness and numbness.  Blood pressure (!) 187/116, pulse (!) 102, temperature (!) 97 F (36.1 C), temperature source Axillary, resp. rate (!) 21, height '6\' 1"'$  (1.854 m), weight 130 kg, SpO2 100 %. Physical Exam Constitutional:      General: He is in acute distress.     Appearance: He is obese. He is ill-appearing.     Comments: On BiPAP, not able to speak complete sentences  HENT:     Head: Normocephalic and atraumatic.     Right Ear: External ear normal.     Left Ear: External ear normal.     Nose: Nose normal. No congestion.  Eyes:     General: No scleral icterus.    Extraocular Movements: Extraocular movements intact.     Conjunctiva/sclera: Conjunctivae normal.  Cardiovascular:     Rate and Rhythm: Regular rhythm. Tachycardia present.     Heart sounds: Normal heart sounds. No murmur heard. Pulmonary:     Effort: Respiratory distress present.     Breath sounds: Wheezing and rales present.     Comments: Bilateral rales over bases with expiratory wheeze.  On BiPAP. Abdominal:     General: Bowel sounds are normal. There is distension.     Palpations: Abdomen is  soft.  Musculoskeletal:     Cervical back: Normal range of motion and neck supple.     Right lower leg: Edema present.     Left lower leg: Edema present.     Comments: 1+-2+ bilateral lower extremity edema, left upper arm AV fistula pulsatile  Skin:    General: Skin is warm and dry.     Findings: No erythema or rash.  Neurological:     Mental Status: He is alert and oriented to person, place, and time.    Assessment/Plan: 1.  Hypoxic respiratory failure/respiratory distress: Secondary to pulmonary edema and likely needing revision of his estimated dry weight/aggressive ultrafiltration with hemodialysis.  This has been ordered to be done urgently this morning to avoid further respiratory compromise. 2.  Malignant hypertension: Associated with volume overload/respiratory distress; monitor with hemodialysis/ultrafiltration. 3.  End-stage renal disease: Usually on a TTS dialysis schedule and this will be  done today emergently for volume unloading.  He will likely need a revision of his estimated dry weight at the time of discharge.  Ultrafiltration is likely to improve his hyponatremia along with glucose control. 4.  Anemia of chronic kidney disease: Without overt blood loss and hemoglobin and hematocrit are currently at goal. 5.  Secondary hyperparathyroidism: Currently n.p.o. while on NIPPV, resume phosphorus binder when able to wean off of BiPAP.  Will attempt to obtain records from outpatient dialysis unit regarding VDRA/Cinacalcet 6.  Diabetes mellitus: Management per primary service  Yarimar Lavis K. 05/14/2021, 9:53 AM

## 2021-05-15 ENCOUNTER — Inpatient Hospital Stay (HOSPITAL_COMMUNITY): Payer: Medicare Other

## 2021-05-15 ENCOUNTER — Other Ambulatory Visit (HOSPITAL_COMMUNITY): Payer: Self-pay | Admitting: *Deleted

## 2021-05-15 DIAGNOSIS — J9601 Acute respiratory failure with hypoxia: Secondary | ICD-10-CM | POA: Diagnosis not present

## 2021-05-15 DIAGNOSIS — J9602 Acute respiratory failure with hypercapnia: Secondary | ICD-10-CM | POA: Diagnosis not present

## 2021-05-15 DIAGNOSIS — R0609 Other forms of dyspnea: Secondary | ICD-10-CM

## 2021-05-15 LAB — GLUCOSE, CAPILLARY
Glucose-Capillary: 157 mg/dL — ABNORMAL HIGH (ref 70–99)
Glucose-Capillary: 173 mg/dL — ABNORMAL HIGH (ref 70–99)
Glucose-Capillary: 99 mg/dL (ref 70–99)
Glucose-Capillary: 219 mg/dL — ABNORMAL HIGH (ref 70–99)

## 2021-05-15 LAB — CBC
HCT: 34.2 % — ABNORMAL LOW (ref 39.0–52.0)
Hemoglobin: 10.8 g/dL — ABNORMAL LOW (ref 13.0–17.0)
MCH: 29.1 pg (ref 26.0–34.0)
MCHC: 31.6 g/dL (ref 30.0–36.0)
MCV: 92.2 fL (ref 80.0–100.0)
Platelets: 272 10*3/uL (ref 150–400)
RBC: 3.71 MIL/uL — ABNORMAL LOW (ref 4.22–5.81)
RDW: 16.1 % — ABNORMAL HIGH (ref 11.5–15.5)
WBC: 9.1 10*3/uL (ref 4.0–10.5)
nRBC: 0 % (ref 0.0–0.2)

## 2021-05-15 LAB — BASIC METABOLIC PANEL
Anion gap: 13 (ref 5–15)
BUN: 69 mg/dL — ABNORMAL HIGH (ref 6–20)
CO2: 28 mmol/L (ref 22–32)
Calcium: 8.5 mg/dL — ABNORMAL LOW (ref 8.9–10.3)
Chloride: 91 mmol/L — ABNORMAL LOW (ref 98–111)
Creatinine, Ser: 7.19 mg/dL — ABNORMAL HIGH (ref 0.61–1.24)
GFR, Estimated: 8 mL/min — ABNORMAL LOW (ref >60)
Glucose, Bld: 216 mg/dL — ABNORMAL HIGH (ref 70–99)
Potassium: 4.3 mmol/L (ref 3.5–5.1)
Sodium: 132 mmol/L — ABNORMAL LOW (ref 135–145)

## 2021-05-15 LAB — BRAIN NATRIURETIC PEPTIDE: B Natriuretic Peptide: 1176 pg/mL — ABNORMAL HIGH (ref 0.0–100.0)

## 2021-05-15 LAB — ECHOCARDIOGRAM COMPLETE
Area-P 1/2: 2.8 cm2
Height: 72 in
S' Lateral: 3.4 cm
Weight: 4804.26 oz

## 2021-05-15 MED ORDER — CLONIDINE HCL 0.1 MG PO TABS
0.1000 mg | ORAL_TABLET | Freq: Three times a day (TID) | ORAL | Status: DC
  Administered 2021-05-15: 0.1 mg via ORAL
  Filled 2021-05-15 (×2): qty 1

## 2021-05-15 MED ORDER — CARVEDILOL 3.125 MG PO TABS: 6.2500 mg | ORAL_TABLET | Freq: Two times a day (BID) | ORAL | Status: DC

## 2021-05-15 NOTE — TOC Initial Note (Signed)
Transition of Care [facility]) - Initial/Assessment Note    Patient Details  Name: BRANDIE BILOTTA MRN: [id number] Date of Birth: April 12, 1971  Transition of Care [facility]) CM/SW Contact:    Salome Arnt, [place] Phone Number: 05/15/2021, 11:28 AM  Clinical Narrative:  Pt admitted due to acute respiratory failure. LCSW completed assessment due to high risk readmission score. Pt reports he lives with a friend and is fairly independent with ADLs. He is on dialysis on Tuesday, Thursday, Saturday 1st shift schedule at [facility]. Pt drives himself to dialysis. He plans to return home when medically stable. TOC will continue to follow.                  Expected Discharge Plan: Home/Self Care Barriers to Discharge: Continued Medical Work up   Patient Goals and CMS Choice Patient states their goals for this hospitalization and ongoing recovery are:: return home      Expected Discharge Plan and Services Expected Discharge Plan: Home/Self Care In-house Referral: Clinical Social Work     Living arrangements for the past 2 months: Single Family Home                 DME Arranged: N/A                    Prior Living Arrangements/Services Living arrangements for the past 2 months: Single Family Home Lives with:: Friends Patient language and need for interpreter reviewed:: Yes Do you feel safe going back to the place where you live?: Yes      Need for Family Participation in Patient Care: No (Comment)     Criminal Activity/Legal Involvement Pertinent to Current Situation/Hospitalization: No - Comment as needed  Activities of Daily Living Home Assistive Devices/Equipment: CBG Meter, Dentures (specify type) (upper dentures) ADL Screening (condition at time of admission) Patient's cognitive ability adequate to safely complete daily activities?: No Is the patient deaf or have difficulty hearing?: No Does the patient have difficulty seeing, even when wearing glasses/contacts?: No Does  the patient have difficulty concentrating, remembering, or making decisions?: No Patient able to express need for assistance with ADLs?: Yes Does the patient have difficulty dressing or bathing?: Yes Independently performs ADLs?: Yes (appropriate for developmental age) Does the patient have difficulty walking or climbing stairs?: No Weakness of Legs: Both Weakness of Arms/Hands: None  Permission Sought/Granted                  Emotional Assessment   Attitude/Demeanor/Rapport: Engaged Affect (typically observed): Accepting Orientation: : Oriented to Self, Oriented to Place, Oriented to  Time, Oriented to Situation Alcohol / Substance Use: Not Applicable Psych Involvement: No (comment)  Admission diagnosis:  Acute pulmonary edema (HCC) [J81.0] Respiratory failure with hypoxia (HCC) [J96.91] Hypertensive emergency [I16.1] Stage 5 chronic kidney disease on chronic dialysis ([place]) [N18.6, Z99.2] Patient Active Problem List   Diagnosis Date Noted   Respiratory failure with hypoxia ([place]) 05/14/2021   Acute respiratory failure with hypoxia and hypercapnia ([place]) 05/14/2021   Mixed hyperlipidemia 08/13/2020   Acute gout of right knee 08/03/2019   Orthostasis    Muscle strain of right wrist    Pain    Physical debility 07/16/2019   ESRD on dialysis [facility])    Anemia of chronic disease    Uncontrolled type 2 diabetes mellitus with hyperglycemia (HCC)    Congestive heart failure (HCC)    Volume overload 07/05/2019   Anasarca 07/03/2019   Dyslipidemia 07/03/2019   Chronic combined systolic  and diastolic CHF (congestive heart failure) ([place]) 07/03/2019   Bilateral leg edema    CKD (chronic kidney disease) stage 5, GFR less than 15 ml/min ([place]) 06/22/2019   Uncontrolled type 2 diabetes mellitus with hyperglycemia, with long-term current use of insulin ([place]) 06/22/2019   Hypokalemia    Thromboembolism ([place]) 02/20/2019   AKI (acute kidney injury) ([place]) 02/20/2019   Acute on chronic  diastolic CHF (congestive heart failure) ([place]) 03/26/2018   Acute respiratory failure with hypoxia ([place]) 03/26/2018   Uncontrolled type 2 diabetes mellitus with stage 5 chronic kidney disease ([place]) 06/12/2012   Hyponatremia 06/12/2012   Microcytic anemia 06/12/2012   Essential hypertension, benign 06/11/2012   Elevated LFTs 06/11/2012   Morbid obesity ([place]) 06/11/2012   Hepatic steatosis 06/11/2012   PCP:  [name], MD Pharmacy:   [facility] [address] [place] [address] Phone: [phone] Fax: [phone]  [facility] - [place], [place] - [address] [address] Phone: [phone] Fax: [phone]     Social Determinants of Health (SDOH) Interventions    Readmission Risk Interventions Readmission Risk Prevention Plan 05/15/2021 06/27/2019 06/23/2019  Transportation Screening Complete - Complete  PCP or Specialist Appt within 3-5 Days - Complete -  HRI or [place] - - Complete  HRI or Home Care Consult comments - - -  Social Work Consult for [place] Planning/Counseling - - Complete  Palliative Care Screening - - Not Applicable  Medication Review Press photographer) Complete - Complete  HRI or Home Care Consult Complete - -  SW Recovery Care/Counseling Consult Complete - -  Palliative Care Screening Not Applicable - -  [place] Not Applicable - -  Some recent data might be hidden

## 2021-05-15 NOTE — Progress Notes (Signed)
  [facility] Progress Note   Assessment/ Plan:   1.  Hypoxic respiratory failure/respiratory distress: Better after HD yesterday but still needing O2.  Will do shorter rx today (3 hr) with additional UF to get him off O2  2.  Malignant hypertension: Associated with volume overload/respiratory distress--> better- nearly off NTG gtt  3.  End-stage renal disease: Usually TTS schedule.  Extra HD today  4.  Anemia of chronic kidney disease: Without overt blood loss and hemoglobin and hematocrit are currently at goal.  5.  Secondary hyperparathyroidism: binders/ VDRA  6.  Diabetes mellitus: Management per primary service  7.  Dispo: If he gets off O2 after HD today and BP is reasonable off NTG gtt- ok to go from renal perspective.  Subjective:    Seen in room.  HD yesterday with 4.5L UF.  Still on 2-3L O2.  Feeling better but not totally back to baseline.     Objective:   BP (!) 144/87   Pulse 96   Temp 98.8 F (37.1 C) (Oral)   Resp 14   Ht 6' (1.829 m)   Wt (!) 136.2 kg   SpO2 96%   BMI 40.72 kg/m   Physical Exam: Gen: NAD, sitting in bed, on O2 CVS: RRR Resp: bibasilar crackles, R >L [id number] Ext: trace LE edema ACCESS: L AVF + T/B  Labs: BMET Recent Labs  Lab 05/14/21 0814 05/15/21 0322  NA 127* 132*  K 4.4 4.3  CL 90* 91*  CO2 23 28  GLUCOSE 342* 216*  BUN 99* 69*  CREATININE 9.25* 7.19*  CALCIUM 8.6* 8.5*   CBC Recent Labs  Lab 05/14/21 0814 05/15/21 0322  WBC 13.9* 9.1  HGB 11.4* 10.8*  HCT 36.2* 34.2*  MCV 92.1 92.2  PLT 287 272      Medications:     allopurinol  200 mg Oral Daily   amLODipine  10 mg Oral Daily   aspirin  81 mg Oral Daily   atorvastatin  80 mg Oral Daily   carvedilol  25 mg Oral BID WC   chlorhexidine  15 mL Mouth Rinse BID   Chlorhexidine Gluconate Cloth  6 each Topical Q0600   cloNIDine  0.3 mg Oral TID   ezetimibe  10 mg Oral QPM   furosemide  80 mg Oral Daily   gabapentin  100 mg Oral TID    heparin  5,000 Units Subcutaneous Q8H   insulin aspart  0-15 Units Subcutaneous TID WC   insulin aspart  0-5 Units Subcutaneous QHS   insulin aspart  3 Units Subcutaneous TID WC   insulin detemir  50 Units Subcutaneous QHS   linaclotide  145 mcg Oral QAC breakfast   mouth rinse  15 mL Mouth Rinse q12n4p   multivitamin  1 tablet Oral QHS   multivitamin with minerals  1 tablet Oral Daily   sevelamer carbonate  2,400 mg Oral TID WC   sodium chloride flush  3 mL Intravenous Q12H   sodium chloride flush  3 mL Intravenous Q12H     Madelon Lips, MD 05/15/2021, 9:01 AM

## 2021-05-15 NOTE — H&P (Signed)
Deshay Medd, is a 50 y.o. male   MRN: [id number]    DOB - January 11, 1971  Admit Date - 05/14/2021  Outpatient Primary MD for the patient is [name], MD    Hospital course:     Yanzel Mullarkey  is a 50 y.o. male with past medical history relevant for ESRD on hemodialysis Tuesdays Thursdays and Saturdays, malignant hypertension, HLD, morbid obesity, combined systolic and diastolic dysfunction CHF, COPD and diabetes mellitus who presents by EMS with shortness of breath -Patient has had shortness of breath for the last couple of days, got worse this morning, he attempted to go to his hemodialysis center--- while at hemodialysis center prior to initiation of HD he became more short of breath and hypoxic EMS was called to the HD center EMS placed patient on CPAP, gave nitro x3, EMS also gave Versed -BP was elevated at 260/150   ED:  In ED troponin is 24, COVID-negative -EKG with sinus tach up to 125, no acute ischemic changes -Chest x-ray with cardiomegaly and interstitial edema , pneumonitis not excluded -WBC 13.9, hemoglobin is 11.4 which is close to baseline -ABG with a PCO2 of 50 PO2 of 79 pH of 7.278 on FiO2 of 50% patient was placed on BiPAP -Sodium was 127, potassium is 4.4 creatinine 9.25 with a BUN of 99 and a glucose of 342 -Nephrology input from Dr. Posey Pronto appreciated with plans for emergent hemodialysis to address volume stat    Assessment & Plan:    Principal Problem:   Acute respiratory failure with hypoxia and hypercapnia ([place]) Active Problems:   Essential hypertension, benign   Morbid obesity ([place])   Acute on chronic diastolic CHF (congestive heart failure) (HCC)   Chronic combined systolic and diastolic CHF (congestive heart failure) (HCC)   ESRD on dialysis ([place])   Uncontrolled type 2 diabetes mellitus with  hyperglycemia ([place])  1) acute hypoxic and hypercapnic respiratory failure/flash pulmonary edema -Due to volume overload, noncompliant with fluid restriction -Status post hemodialysis, hypoxia hypercapnia improving, -Satting 96% on 4 L of oxygen  -Patient elevated BP on admission ( at 260/150) -Chest x-ray with interstitial edema and cardiomegaly -Clinically presenting with flash pulmonary edema -ABG reflective of uncompensated respiratory acidosis -ABG with a PCO2 of 50 PO2 of 79 pH of 7.278 on FiO2 of 50% patient was placed on BiPAP -COVID-negative - Echo:   2) Hypertensive crisis with flash pulmonary edema- -Improved respiratory distress, improved blood pressure, currently 144/87 -- on admission BP at 260/150 by EMS, repeat BP here 226/144 -Responded well to IV nitro drip and hemodialysis -  -Versed and lorazepam -discontinued -Continue to titrate nitro drip now -Overall BP improving with above measures -Restarted home medications of amlodipine 10 mg daily, Coreg 25 mg twice daily, Lasix 80 mg daily, clonidine 0.3 mg 3 times daily -Appreciate nephrology input  3)HFpEF--Echo from 06/16/2019 with EF of 50 to [id number], -Complicated by flash pulmonary edema, uncontrolled BP, accelerated BP -Volume overload, as managed above -2D  echo-pending - troponin is 24, -EKG with sinus tach up to 125, no acute ischemic changes--- likely ischemic demand due to above  4)ESRD-- -Schedule hemodialysis T/T/S--- patient admits to noncompliance with fluid restriction over the weekend --Sodium was 127, potassium is 4.4 creatinine 9.25 with a BUN of 99 and a glucose of 342 -Nephrology input from Dr. Posey Pronto appreciated with plans -status post emergent hemodialysis,  -Dissipate hemodialysis be continued today  -Anticipate that patient will require serial/back-to-back hemodialysis sessions to address volume status and hypoxia  5)DM2- - on admission blood glucose 342 >>> 264, 194, 283, 157 this a.m. -Recent  A1c 8.1 reflecting uncontrolled diabetes with hyperglycemia PTA -Checking CBG QA CHS, SSI coverage  6) chronic anemia of ESRD- - stable  - hemoglobin 11.4 which is close to baseline, continue to monitor -EPO/ESA agent per nephrology team  7) leukocytosis- -- WBC 13.9 >> 9.1 today  -  no cough, no fevers, chest x-ray consistent with interstitial edema, however cannot rule out pneumonitis, -Repeat chest x-ray on 05/15/2021. after hemodialysis -Hold off on antibiotics at this time  8) hyponatremia-- - sodium is 127 >>> 132 now   - stable   Disposition/Need for in-Hospital Stay- patient unable to be discharged at this time due to acute hypoxic and hypercapnic respiratory failure in the setting of hypertensive crisis leading to flash pulm edema requiring urgent hemodialysis and IV nitro drip for BP control---Anticipate that patient will require serial/back-to-back hemodialysis sessions to address volume status and hypoxia  Status is: Inpatient  Remains inpatient appropriate because: Please see disposition above  Dispo: The patient is from: Home              Anticipated d/c is to: Home              Anticipated d/c date is: 2 days              Patient currently is not medically stable to d/c. Barriers: Not Clinically Stable-   With History of - Reviewed by me  Past Medical History:  Diagnosis Date   Acute diastolic CHF (congestive heart failure) ([place]) 06/11/2012   EF 50-55% [facility])   Chronic kidney disease    COPD (chronic obstructive pulmonary disease) (HCC)    Diabetes mellitus    A1c 11.5 (06/11/2012).   Dyspnea    Gout    Hepatic steatosis 06/11/2012   Elevated LFTs   Hyperlipemia    Malignant hypertension    Microcytic anemia 06/12/2012   Obesity       Past Surgical History:  Procedure Laterality Date   AV FISTULA PLACEMENT Left 11/11/2019   Procedure: ARTERIOVENOUS (AV) FISTULA CREATION LEFT BRACHIOCEPHALIC ARM;  Surgeon: Serafina Mitchell, MD;  Location: [facility]  OR;  Service: Vascular;  Laterality: Left;   IR FLUORO GUIDE CV LINE RIGHT  07/08/2019   IR REMOVAL TUN CV CATH W/O FL  03/23/2020   IR US GUIDE VASC ACCESS RIGHT  07/08/2019   NO PAST SURGERIES     VASCULAR SURGERY        Chief Complaint  Patient presents with   Respiratory Distress     Medications:   Current Facility-Administered Medications:    0.9 %  sodium chloride infusion, 100 mL, Intravenous, PRN, Emokpae, Courage, MD   0.9 %  sodium chloride infusion, 100 mL, Intravenous, PRN, Emokpae, Courage, MD   acetaminophen (TYLENOL) tablet 650 mg, 650 mg, Oral, Q6H PRN **OR** acetaminophen (TYLENOL) suppository 650 mg, 650 mg, Rectal, Q6H PRN, Emokpae, Courage,  MD   albuterol (VENTOLIN HFA) 108 (90 Base) MCG/ACT inhaler 2 puff, 2 puff, Inhalation, Q4H PRN, Denton Brick, Courage, MD   allopurinol (ZYLOPRIM) tablet 200 mg, 200 mg, Oral, Daily, Emokpae, Courage, MD, 200 mg at 05/15/21 0901   amLODipine (NORVASC) tablet 10 mg, 10 mg, Oral, Daily, Emokpae, Courage, MD, 10 mg at 05/15/21 0901   aspirin chewable tablet 81 mg, 81 mg, Oral, Daily, Emokpae, Courage, MD, 81 mg at 05/15/21 0901   atorvastatin (LIPITOR) tablet 80 mg, 80 mg, Oral, Daily, Emokpae, Courage, MD, 80 mg at 05/15/21 0902   bisacodyl (DULCOLAX) suppository 10 mg, 10 mg, Rectal, Daily PRN, Emokpae, Courage, MD   calcium-vitamin D (OSCAL WITH D) 500-200 MG-UNIT per tablet 1 tablet, 1 tablet, Oral, Q6H PRN, Emokpae, Courage, MD   carvedilol (COREG) tablet 25 mg, 25 mg, Oral, BID WC, Emokpae, Courage, MD, 25 mg at 05/15/21 0901   Chlorhexidine Gluconate Cloth 2 % PADS 6 each, 6 each, Topical, Q0600, Emokpae, Courage, MD, 6 each at 05/15/21 0557   cloNIDine (CATAPRES) tablet 0.3 mg, 0.3 mg, Oral, TID, Denton Brick, Courage, MD, 0.3 mg at 05/15/21 0902   ezetimibe (ZETIA) tablet 10 mg, 10 mg, Oral, QPM, Emokpae, Courage, MD, 10 mg at 05/14/21 1821   furosemide (LASIX) tablet 80 mg, 80 mg, Oral, Daily, Emokpae, Courage, MD, 80 mg at 05/15/21  0902   gabapentin (NEURONTIN) capsule 100 mg, 100 mg, Oral, TID, Emokpae, Courage, MD, 100 mg at 05/15/21 0901   heparin injection 5,000 Units, 5,000 Units, Subcutaneous, Q8H, Emokpae, Courage, MD, 5,000 Units at 05/15/21 0558   heparin injection 5,200 Units, 40 Units/kg, Dialysis, PRN, Emokpae, Courage, MD   HYDROcodone-acetaminophen (NORCO/VICODIN) 5-325 MG per tablet 1 tablet, 1 tablet, Oral, Q6H PRN, Emokpae, Courage, MD   insulin aspart (novoLOG) injection 0-15 Units, 0-15 Units, Subcutaneous, TID WC, Emokpae, Courage, MD, 3 Units at 05/15/21 0900   insulin aspart (novoLOG) injection 0-5 Units, 0-5 Units, Subcutaneous, QHS, Emokpae, Courage, MD, 3 Units at 05/14/21 2117   insulin aspart (novoLOG) injection 3 Units, 3 Units, Subcutaneous, TID WC, Emokpae, Courage, MD, 3 Units at 05/15/21 0900   insulin detemir (LEVEMIR) injection 50 Units, 50 Units, Subcutaneous, QHS, Emokpae, Courage, MD, 50 Units at 05/14/21 2117   labetalol (NORMODYNE) injection 10 mg, 10 mg, Intravenous, Q4H PRN, Emokpae, Courage, MD   lidocaine (PF) (XYLOCAINE) 1 % injection 5 mL, 5 mL, Intradermal, PRN, Emokpae, Courage, MD   lidocaine-prilocaine (EMLA) cream 1 application, 1 application, Topical, PRN, D[place] Brick, Courage, MD   linaclotide (LINZESS) capsule 145 mcg, 145 mcg, Oral, QAC breakfast, Emokpae, Courage, MD, 145 mcg at 05/15/21 0901   multivitamin (RENA-VIT) tablet 1 tablet, 1 tablet, Oral, QHS, Emokpae, Courage, MD, 1 tablet at 05/14/21 2116   nitroGLYCERIN 50 mg in dextrose 5 % 250 mL (0.2 mg/mL) infusion, 5-200 mcg/min, Intravenous, Continuous, Emokpae, Courage, MD, Last Rate: 1.5 mL/hr at 05/15/21 0557, 5 mcg/min at 05/15/21 0557   ondansetron (ZOFRAN) tablet 4 mg, 4 mg, Oral, Q6H PRN **OR** ondansetron (ZOFRAN) injection 4 mg, 4 mg, Intravenous, Q6H PRN, Emokpae, Courage, MD   pentafluoroprop-tetrafluoroeth (GEBAUERS) aerosol 1 application, 1 application, Topical, PRN, Emokpae, Courage, MD   polyethylene glycol  (MIRALAX / GLYCOLAX) packet 17 g, 17 g, Oral, Daily PRN, Emokpae, Courage, MD   sevelamer carbonate (RENVELA) tablet 2,400 mg, 2,400 mg, Oral, TID WC, Emokpae, Courage, MD, 2,400 mg at 05/15/21 0901   sodium chloride flush (NS) 0.9 % injection 3 mL, 3 mL, Intravenous, Q12H, Emokpae, Courage, MD, 3  mL at 05/14/21 2121   Allergies:     Allergies  Allergen Reactions   Lisinopril Swelling   Hydralazine Anxiety    Hallucinations      Physical Exam:   Vitals with BMI 05/15/2021 05/15/2021 05/15/2021  Height - - -  Weight - 300 lbs 4 oz -  BMI - [id number] -  Systolic - 123456 0000000  Diastolic - 87 92  Pulse 96 91 91      Physical Exam:   General:  Alert, oriented, cooperative, mild respiratory distress, with supplemental oxygen Overly obese male... Off BiPAP  HEENT:  Normocephalic, PERRL, otherwise with in Normal limits   Neuro:  CNII-XII intact. , normal motor and sensation, reflexes intact   Lungs:   Improved breath sounds diffusely, mild shortness of breath at rest, worse with exertion clear to auscultation BL, Respirations unlabored, no wheezes / crackles  Cardio:    S1/S2, RRR, No murmure, No Rubs or Gallops   Abdomen:   Soft, non-tender, bowel sounds active all four quadrants,  no guarding or peritoneal signs.  Muscular skeletal:  Limited exam - in bed, able to move all 4 extremities, Normal strength,  2+ pulses,  symmetric, No pitting edema  Skin:  Dry, warm to touch, negative for any Rashes,  Wounds: Please see nursing documentation left arm AV fistula with positive thrill and bruit     Data Review:    CBC Recent Labs  Lab 05/14/21 0814 05/15/21 0322  WBC 13.9* 9.1  HGB 11.4* 10.8*  HCT 36.2* 34.2*  PLT 287 272  MCV 92.1 92.2  MCH 29.0 29.1  MCHC 31.5 31.6  RDW 15.9* 16.1*   ------------------------------------------------------------------------------------------------------------------  Chemistries  Recent Labs  Lab 05/14/21 0814 05/15/21 0322  NA 127* 132*   K 4.4 4.3  CL 90* 91*  CO2 23 28  GLUCOSE 342* 216*  BUN 99* 69*  CREATININE 9.25* 7.19*  CALCIUM 8.6* 8.5*   ------------------------------------------------------------------------------------------------------------------ estimated creatinine clearance is 17.2 mL/min (A) (by C-G formula based on SCr of 7.19 mg/dL (H)). ------------------------------------------------------------------------------------------------------------------ No results for input(s): TSH, T4TOTAL, T3FREE, THYROIDAB in the last 72 hours.  Invalid input(s): FREET3   Coagulation profile No results for input(s): INR, PROTIME in the last 168 hours. ------------------------------------------------------------------------------------------------------------------- No results for input(s): DDIMER in the last 72 hours. -------------------------------------------------------------------------------------------------------------------  Cardiac Enzymes No results for input(s): CKMB, TROPONINI, MYOGLOBIN in the last 168 hours.  Invalid input(s): CK ------------------------------------------------------------------------------------------------------------------    Component Value Date/Time   BNP 1,176.0 (H) 05/15/2021 0322     ---------------------------------------------------------------------------------------------------------------  Urinalysis    Component Value Date/Time   COLORURINE STRAW (A) 04/20/2021 1740   APPEARANCEUR CLEAR 04/20/2021 1740   LABSPEC 1.011 04/20/2021 1740   PHURINE 8.0 04/20/2021 1740   GLUCOSEU 150 (A) 04/20/2021 1740   HGBUR NEGATIVE 04/20/2021 1740   BILIRUBINUR NEGATIVE 04/20/2021 1740   KETONESUR NEGATIVE 04/20/2021 1740   PROTEINUR 100 (A) 04/20/2021 1740   UROBILINOGEN 0.2 06/11/2012 0518   NITRITE NEGATIVE 04/20/2021 1740   LEUKOCYTESUR NEGATIVE 04/20/2021 1740     ----------------------------------------------------------------------------------------------------------------   Imaging Results:    DG Chest Port 1 View  Result Date: 05/14/2021 CLINICAL DATA:  Shortness of breath. EXAM: PORTABLE CHEST 1 VIEW COMPARISON:  04/20/2021. FINDINGS: Cardiomegaly with diffuse bilateral interstitial prominence consistent with interstitial edema. Pneumonitis cannot be excluded. Low lung volumes. No pleural effusion or pneumothorax. IMPRESSION: 1. Cardiomegaly with diffuse bilateral interstitial prominence consistent interstitial edema. Pneumonitis cannot be excluded. 2.  Low lung volumes. Electronically Signed   By:  [place]   On: 05/14/2021 08:34    Radiological Exams on Admission: DG Chest Port 1 View  Result Date: 05/14/2021 CLINICAL DATA:  Shortness of breath. EXAM: PORTABLE CHEST 1 VIEW COMPARISON:  04/20/2021. FINDINGS: Cardiomegaly with diffuse bilateral interstitial prominence consistent with interstitial edema. Pneumonitis cannot be excluded. Low lung volumes. No pleural effusion or pneumothorax. IMPRESSION: 1. Cardiomegaly with diffuse bilateral interstitial prominence consistent interstitial edema. Pneumonitis cannot be excluded. 2.  Low lung volumes. Electronically Signed   By: Marcello Moores  Register   On: 05/14/2021 08:34   Condition   fair  Deatra James M.D on 05/15/2021 at 10:41 AM Go to [email or web address] -  for contact info  [facility] - Office  [phone]

## 2021-05-15 NOTE — Progress Notes (Signed)
*  PRELIMINARY RESULTS* Echocardiogram 2D Echocardiogram has been performed.  Kelly Key 05/15/2021, 12:17 PM

## 2021-05-15 NOTE — Procedures (Signed)
   HEMODIALYSIS TREATMENT NOTE:  Extra HD session for volume overload was ordered after primary RN had already given morning antihypertensives.  UF was limited by hypotension--only able to remove 1.9 liters.  Pt was weaned off O2, saturating >96% on room air.  All blood was returned and hemostasis was achieved in 15 minutes.    Outpatient dialysis clinic confirms they will reserve a seat for patient second shift tomorrow, Thursday 05/16/21.  1130 arrival time - pt is agreeable, if he is able to be discharged.  Rockwell Alexandria, RN

## 2021-05-16 DIAGNOSIS — E785 Hyperlipidemia, unspecified: Secondary | ICD-10-CM | POA: Diagnosis not present

## 2021-05-16 DIAGNOSIS — I10 Essential (primary) hypertension: Secondary | ICD-10-CM | POA: Diagnosis not present

## 2021-05-16 DIAGNOSIS — E1165 Type 2 diabetes mellitus with hyperglycemia: Secondary | ICD-10-CM

## 2021-05-16 DIAGNOSIS — N186 End stage renal disease: Secondary | ICD-10-CM

## 2021-05-16 DIAGNOSIS — I214 Non-ST elevation (NSTEMI) myocardial infarction: Secondary | ICD-10-CM

## 2021-05-16 DIAGNOSIS — I5042 Chronic combined systolic (congestive) and diastolic (congestive) heart failure: Secondary | ICD-10-CM

## 2021-05-16 LAB — COMPREHENSIVE METABOLIC PANEL
ALT: 14 U/L (ref 0–44)
AST: 12 U/L — ABNORMAL LOW (ref 15–41)
Albumin: 3.4 g/dL — ABNORMAL LOW (ref 3.5–5.0)
Alkaline Phosphatase: 49 U/L (ref 38–126)
Anion gap: 12 (ref 5–15)
BUN: 52 mg/dL — ABNORMAL HIGH (ref 6–20)
CO2: 29 mmol/L (ref 22–32)
Calcium: 8.6 mg/dL — ABNORMAL LOW (ref 8.9–10.3)
Chloride: 94 mmol/L — ABNORMAL LOW (ref 98–111)
Creatinine, Ser: 6.39 mg/dL — ABNORMAL HIGH (ref 0.61–1.24)
GFR, Estimated: 10 mL/min — ABNORMAL LOW (ref >60)
Glucose, Bld: 89 mg/dL (ref 70–99)
Potassium: 3.9 mmol/L (ref 3.5–5.1)
Sodium: 135 mmol/L (ref 135–145)
Total Bilirubin: 0.5 mg/dL (ref 0.3–1.2)
Total Protein: 7.2 g/dL (ref 6.5–8.1)

## 2021-05-16 LAB — CBC
HCT: 35.8 % — ABNORMAL LOW (ref 39.0–52.0)
Hemoglobin: 11.5 g/dL — ABNORMAL LOW (ref 13.0–17.0)
MCH: 29.8 pg (ref 26.0–34.0)
MCHC: 32.1 g/dL (ref 30.0–36.0)
MCV: 92.7 fL (ref 80.0–100.0)
Platelets: 287 10*3/uL (ref 150–400)
RBC: 3.86 MIL/uL — ABNORMAL LOW (ref 4.22–5.81)
RDW: 16.4 % — ABNORMAL HIGH (ref 11.5–15.5)
WBC: 8.3 10*3/uL (ref 4.0–10.5)
nRBC: 0 % (ref 0.0–0.2)

## 2021-05-16 MED ORDER — FUROSEMIDE 80 MG PO TABS: 80.0000 mg | ORAL_TABLET | Freq: Every day | ORAL | 1 refills | Status: DC

## 2021-05-16 MED ORDER — CARVEDILOL 12.5 MG PO TABS
12.5000 mg | ORAL_TABLET | Freq: Two times a day (BID) | ORAL | Status: DC
  Filled 2021-05-16: qty 1

## 2021-05-16 MED ORDER — CLONIDINE HCL 0.2 MG PO TABS: 0.2000 mg | ORAL_TABLET | Freq: Three times a day (TID) | ORAL | Status: DC

## 2021-05-16 MED ORDER — AMLODIPINE BESYLATE 10 MG PO TABS: 5.0000 mg | ORAL_TABLET | Freq: Every day | ORAL | 1 refills | Status: DC

## 2021-05-16 NOTE — Plan of Care (Signed)

## 2021-05-16 NOTE — Progress Notes (Signed)
  [facility] Progress Note   Assessment/ Plan:   1.  Hypoxic respiratory failure/respiratory distress: resolved after HD 7/19 and 7/20  2.  Malignant hypertension: resolved  3.  End-stage renal disease: Usually TTS schedule.  Extra HD today  4.  Anemia of chronic kidney disease: Without overt blood loss and hemoglobin and hematocrit are currently at goal.  5.  Secondary hyperparathyroidism: binders/ VDRA  6.  Diabetes mellitus: Management per primary service  7.  Dispo: OK to go from renal perspective--> he has appt today at 11:30 for his regularly scheduled dialysis session.  Subjective:    Seen in room.  Dressed and packed up- appears that d/c is imminent.  No issues today.   HD yesterday with nearly 2L off.     Objective:   BP (!) 151/68   Pulse 90   Temp 97.9 F (36.6 C) (Oral)   Resp 11   Ht 6' (1.829 m)   Wt (!) 136.2 kg   SpO2 98%   BMI 40.72 kg/m   Physical Exam: Gen: NAD, sitting in bed, off o2 CVS: RRR Resp: bibasilar crackles resolved [id number] Ext: trace LE edema ACCESS: L AVF + T/B  Labs: BMET Recent Labs  Lab 05/14/21 0814 05/15/21 0322 05/16/21 0413  NA 127* 132* 135  K 4.4 4.3 3.9  CL 90* 91* 94*  CO2 '23 28 29  '$ GLUCOSE 342* 216* 89  BUN 99* 69* 52*  CREATININE 9.25* 7.19* 6.39*  CALCIUM 8.6* 8.5* 8.6*   CBC Recent Labs  Lab 05/14/21 0814 05/15/21 0322 05/16/21 0413  WBC 13.9* 9.1 8.3  HGB 11.4* 10.8* 11.5*  HCT 36.2* 34.2* 35.8*  MCV 92.1 92.2 92.7  PLT 287 272 287      Medications:     allopurinol  200 mg Oral Daily   amLODipine  10 mg Oral Daily   aspirin  81 mg Oral Daily   atorvastatin  80 mg Oral Daily   carvedilol  12.5 mg Oral BID WC   Chlorhexidine Gluconate Cloth  6 each Topical Q0600   cloNIDine  0.2 mg Oral TID   ezetimibe  10 mg Oral QPM   furosemide  80 mg Oral Daily   gabapentin  100 mg Oral TID   heparin  5,000 Units Subcutaneous Q8H   insulin aspart  0-15 Units Subcutaneous TID WC    insulin aspart  0-5 Units Subcutaneous QHS   insulin aspart  3 Units Subcutaneous TID WC   insulin detemir  50 Units Subcutaneous QHS   linaclotide  145 mcg Oral QAC breakfast   multivitamin  1 tablet Oral QHS   sevelamer carbonate  2,400 mg Oral TID WC   sodium chloride flush  3 mL Intravenous Q12H     Madelon Lips, MD 05/16/2021, 9:51 AM

## 2021-05-16 NOTE — Progress Notes (Signed)
BP 182/106 and HR 91.  Patient is asymptomatic at this time and resting with eyes closed. Dr. Clearence Ped made aware and no new orders at this time.  Will continue to monitor.

## 2021-05-16 NOTE — Discharge Instructions (Signed)
Dialysis to resume today with normal schedule.   Follow up with your primary care doctor in 2 weeks.   FOLLOW your fluid restriction

## 2021-05-17 ENCOUNTER — Other Ambulatory Visit: Payer: Self-pay

## 2021-05-17 ENCOUNTER — Observation Stay (HOSPITAL_COMMUNITY)
Admission: EM | Admit: 2021-05-17 | Discharge: 2021-05-18 | Disposition: A | Discharge status: Home / Self Care | Payer: Medicare Other | Attending: Internal Medicine | Admitting: Internal Medicine

## 2021-05-17 ENCOUNTER — Emergency Department (HOSPITAL_COMMUNITY): Payer: Medicare Other

## 2021-05-17 ENCOUNTER — Encounter (HOSPITAL_COMMUNITY): Payer: Self-pay | Admitting: Emergency Medicine

## 2021-05-17 DIAGNOSIS — Z7982 Long term (current) use of aspirin: Secondary | ICD-10-CM | POA: Diagnosis not present

## 2021-05-17 DIAGNOSIS — R0609 Other forms of dyspnea: Secondary | ICD-10-CM

## 2021-05-17 DIAGNOSIS — E1165 Type 2 diabetes mellitus with hyperglycemia: Secondary | ICD-10-CM | POA: Diagnosis present

## 2021-05-17 DIAGNOSIS — Z794 Long term (current) use of insulin: Secondary | ICD-10-CM | POA: Diagnosis not present

## 2021-05-17 DIAGNOSIS — J810 Acute pulmonary edema: Secondary | ICD-10-CM | POA: Insufficient documentation

## 2021-05-17 DIAGNOSIS — E871 Hypo-osmolality and hyponatremia: Secondary | ICD-10-CM | POA: Diagnosis not present

## 2021-05-17 DIAGNOSIS — Z20822 Contact with and (suspected) exposure to covid-19: Secondary | ICD-10-CM | POA: Insufficient documentation

## 2021-05-17 DIAGNOSIS — I214 Non-ST elevation (NSTEMI) myocardial infarction: Secondary | ICD-10-CM | POA: Diagnosis present

## 2021-05-17 DIAGNOSIS — D631 Anemia in chronic kidney disease: Secondary | ICD-10-CM | POA: Diagnosis not present

## 2021-05-17 DIAGNOSIS — N186 End stage renal disease: Secondary | ICD-10-CM | POA: Diagnosis not present

## 2021-05-17 DIAGNOSIS — Z7984 Long term (current) use of oral hypoglycemic drugs: Secondary | ICD-10-CM | POA: Insufficient documentation

## 2021-05-17 DIAGNOSIS — I132 Hypertensive heart and chronic kidney disease with heart failure and with stage 5 chronic kidney disease, or end stage renal disease: Secondary | ICD-10-CM | POA: Insufficient documentation

## 2021-05-17 DIAGNOSIS — R0600 Dyspnea, unspecified: Secondary | ICD-10-CM

## 2021-05-17 DIAGNOSIS — J449 Chronic obstructive pulmonary disease, unspecified: Secondary | ICD-10-CM | POA: Insufficient documentation

## 2021-05-17 DIAGNOSIS — I5042 Chronic combined systolic (congestive) and diastolic (congestive) heart failure: Secondary | ICD-10-CM | POA: Diagnosis present

## 2021-05-17 DIAGNOSIS — E1122 Type 2 diabetes mellitus with diabetic chronic kidney disease: Secondary | ICD-10-CM | POA: Insufficient documentation

## 2021-05-17 DIAGNOSIS — Z992 Dependence on renal dialysis: Secondary | ICD-10-CM | POA: Diagnosis not present

## 2021-05-17 DIAGNOSIS — Z79899 Other long term (current) drug therapy: Secondary | ICD-10-CM | POA: Insufficient documentation

## 2021-05-17 DIAGNOSIS — D72829 Elevated white blood cell count, unspecified: Secondary | ICD-10-CM | POA: Insufficient documentation

## 2021-05-17 DIAGNOSIS — J9601 Acute respiratory failure with hypoxia: Principal | ICD-10-CM | POA: Insufficient documentation

## 2021-05-17 DIAGNOSIS — E785 Hyperlipidemia, unspecified: Secondary | ICD-10-CM | POA: Diagnosis present

## 2021-05-17 DIAGNOSIS — I10 Essential (primary) hypertension: Secondary | ICD-10-CM | POA: Diagnosis present

## 2021-05-17 HISTORY — DX: Non-ST elevation (NSTEMI) myocardial infarction: I21.4

## 2021-05-17 LAB — COMPREHENSIVE METABOLIC PANEL
ALT: 16 U/L (ref 0–44)
AST: 12 U/L — ABNORMAL LOW (ref 15–41)
Albumin: 3.5 g/dL (ref 3.5–5.0)
Alkaline Phosphatase: 48 U/L (ref 38–126)
Anion gap: 12 (ref 5–15)
BUN: 61 mg/dL — ABNORMAL HIGH (ref 6–20)
CO2: 28 mmol/L (ref 22–32)
Calcium: 8.6 mg/dL — ABNORMAL LOW (ref 8.9–10.3)
Chloride: 96 mmol/L — ABNORMAL LOW (ref 98–111)
Creatinine, Ser: 8.07 mg/dL — ABNORMAL HIGH (ref 0.61–1.24)
GFR, Estimated: 7 mL/min — ABNORMAL LOW (ref >60)
Glucose, Bld: 138 mg/dL — ABNORMAL HIGH (ref 70–99)
Potassium: 4.4 mmol/L (ref 3.5–5.1)
Sodium: 136 mmol/L (ref 135–145)
Total Bilirubin: 0.4 mg/dL (ref 0.3–1.2)
Total Protein: 6.9 g/dL (ref 6.5–8.1)

## 2021-05-17 LAB — CBC WITH DIFFERENTIAL/PLATELET
Abs Immature Granulocytes: 0.02 10*3/uL (ref 0.00–0.07)
Basophils Absolute: 0 10*3/uL (ref 0.0–0.1)
Basophils Relative: 0 %
Eosinophils Absolute: 0.3 10*3/uL (ref 0.0–0.5)
Eosinophils Relative: 5 %
HCT: 30.9 % — ABNORMAL LOW (ref 39.0–52.0)
Hemoglobin: 9.8 g/dL — ABNORMAL LOW (ref 13.0–17.0)
Immature Granulocytes: 0 %
Lymphocytes Relative: 33 %
Lymphs Abs: 2.5 10*3/uL (ref 0.7–4.0)
MCH: 29.4 pg (ref 26.0–34.0)
MCHC: 31.7 g/dL (ref 30.0–36.0)
MCV: 92.8 fL (ref 80.0–100.0)
Monocytes Absolute: 0.7 10*3/uL (ref 0.1–1.0)
Monocytes Relative: 9 %
Neutro Abs: 3.9 10*3/uL (ref 1.7–7.7)
Neutrophils Relative %: 53 %
Platelets: 246 10*3/uL (ref 150–400)
RBC: 3.33 MIL/uL — ABNORMAL LOW (ref 4.22–5.81)
RDW: 16.7 % — ABNORMAL HIGH (ref 11.5–15.5)
WBC: 7.5 10*3/uL (ref 4.0–10.5)
nRBC: 0 % (ref 0.0–0.2)

## 2021-05-17 LAB — BRAIN NATRIURETIC PEPTIDE: B Natriuretic Peptide: 199 pg/mL — ABNORMAL HIGH (ref 0.0–100.0)

## 2021-05-17 LAB — RESP PANEL BY RT-PCR (FLU A&B, COVID) ARPGX2
Influenza A by PCR: NEGATIVE
Influenza B by PCR: NEGATIVE
SARS Coronavirus 2 by RT PCR: NEGATIVE

## 2021-05-17 LAB — TROPONIN I (HIGH SENSITIVITY)
Troponin I (High Sensitivity): 112 ng/L (ref <18)
Troponin I (High Sensitivity): 100 ng/L (ref <18)

## 2021-05-17 MED ORDER — INSULIN ASPART 100 UNIT/ML IJ SOLN
0.0000 [IU] | Freq: Every day | INTRAMUSCULAR | Status: DC
  Administered 2021-05-18: 2 [IU] via SUBCUTANEOUS

## 2021-05-17 MED ORDER — FUROSEMIDE 80 MG PO TABS: 80.0000 mg | ORAL_TABLET | Freq: Every day | ORAL | Status: DC

## 2021-05-17 MED ORDER — OXYCODONE-ACETAMINOPHEN 5-325 MG PO TABS: 1.0000 | ORAL_TABLET | Freq: Four times a day (QID) | ORAL | Status: DC | PRN

## 2021-05-17 MED ORDER — ONDANSETRON HCL 4 MG/2ML IJ SOLN: 4.0000 mg | Freq: Four times a day (QID) | INTRAMUSCULAR | Status: DC | PRN

## 2021-05-17 MED ORDER — CLONIDINE HCL 0.2 MG PO TABS
0.3000 mg | ORAL_TABLET | Freq: Three times a day (TID) | ORAL | Status: DC
  Administered 2021-05-18 (×2): 0.3 mg via ORAL
  Filled 2021-05-17 (×2): qty 1

## 2021-05-17 MED ORDER — ATORVASTATIN CALCIUM 80 MG PO TABS
80.0000 mg | ORAL_TABLET | Freq: Every day | ORAL | Status: DC
  Administered 2021-05-18: 80 mg via ORAL
  Filled 2021-05-17: qty 1

## 2021-05-17 MED ORDER — HEPARIN SODIUM (PORCINE) 5000 UNIT/ML IJ SOLN
5000.0000 [IU] | Freq: Three times a day (TID) | INTRAMUSCULAR | Status: DC
  Administered 2021-05-18 (×2): 5000 [IU] via SUBCUTANEOUS
  Filled 2021-05-17 (×2): qty 1

## 2021-05-17 MED ORDER — RENA-VITE PO TABS
1.0000 | ORAL_TABLET | Freq: Every day | ORAL | Status: DC
  Administered 2021-05-18: 1 via ORAL
  Filled 2021-05-17: qty 1

## 2021-05-17 MED ORDER — ALBUTEROL SULFATE HFA 108 (90 BASE) MCG/ACT IN AERS
2.0000 | INHALATION_SPRAY | RESPIRATORY_TRACT | Status: DC | PRN
  Filled 2021-05-17: qty 6.7

## 2021-05-17 MED ORDER — ASPIRIN EC 81 MG PO TBEC
81.0000 mg | DELAYED_RELEASE_TABLET | Freq: Every day | ORAL | Status: DC
  Administered 2021-05-18: 81 mg via ORAL
  Filled 2021-05-17: qty 1

## 2021-05-17 MED ORDER — GABAPENTIN 100 MG PO CAPS
100.0000 mg | ORAL_CAPSULE | Freq: Three times a day (TID) | ORAL | Status: DC
  Administered 2021-05-18 (×3): 100 mg via ORAL
  Filled 2021-05-17 (×3): qty 1

## 2021-05-17 MED ORDER — INSULIN ASPART 100 UNIT/ML IJ SOLN
0.0000 [IU] | Freq: Three times a day (TID) | INTRAMUSCULAR | Status: DC
  Administered 2021-05-18: 3 [IU] via SUBCUTANEOUS

## 2021-05-17 MED ORDER — CARVEDILOL 25 MG PO TABS
25.0000 mg | ORAL_TABLET | Freq: Two times a day (BID) | ORAL | Status: DC
  Administered 2021-05-18: 25 mg via ORAL
  Filled 2021-05-17: qty 1

## 2021-05-17 MED ORDER — ACETAMINOPHEN 325 MG PO TABS: 650.0000 mg | ORAL_TABLET | ORAL | Status: DC | PRN

## 2021-05-17 MED ORDER — INSULIN DETEMIR 100 UNIT/ML ~~LOC~~ SOLN
50.0000 [IU] | Freq: Every day | SUBCUTANEOUS | Status: DC
  Administered 2021-05-18: 50 [IU] via SUBCUTANEOUS
  Filled 2021-05-17 (×2): qty 0.5

## 2021-05-17 MED ORDER — AMLODIPINE BESYLATE 5 MG PO TABS
5.0000 mg | ORAL_TABLET | Freq: Every day | ORAL | Status: DC
  Administered 2021-05-18: 5 mg via ORAL
  Filled 2021-05-17: qty 1

## 2021-05-17 MED ORDER — SEVELAMER CARBONATE 800 MG PO TABS
2400.0000 mg | ORAL_TABLET | Freq: Three times a day (TID) | ORAL | Status: DC
  Administered 2021-05-18 (×2): 2400 mg via ORAL
  Filled 2021-05-17 (×2): qty 3

## 2021-05-17 MED ORDER — ALUM & MAG HYDROXIDE-SIMETH 200-200-20 MG/5ML PO SUSP: 30.0000 mL | Freq: Once | ORAL | Status: DC

## 2021-05-17 MED ORDER — EZETIMIBE 10 MG PO TABS
10.0000 mg | ORAL_TABLET | Freq: Every evening | ORAL | Status: DC
  Administered 2021-05-18: 10 mg via ORAL
  Filled 2021-05-17: qty 1

## 2021-05-17 NOTE — ED Notes (Signed)
Date and time results received: 05/17/21 @ 2138 (use smartphrase ".now" to insert current time)  Test: Troponin  Critical Value: 112 Name of Provider Notified:Dr Zammit Orders Received? None yet Or Actions Taken?:

## 2021-05-17 NOTE — ED Triage Notes (Signed)
Pt c/o increased sob that started today. Pt is due for dialysis tomorrow.

## 2021-05-17 NOTE — ED Provider Notes (Signed)
[facility] EMERGENCY DEPARTMENT Provider Note   CSN: [id number] Arrival date & time: 05/17/21  1914     History Chief Complaint  Patient presents with   Shortness of Breath    Kelly Key is a 50 y.o. male.  Patient complains of shortness of breath.  Patient has a history of end-stage renal disease COPD congestive heart failure.  He was just discharged yesterday and needs dialysis tomorrow  The history is provided by the patient and medical records. No language interpreter was used.  Shortness of Breath Severity:  Moderate Onset quality:  Sudden Duration:  1 day Timing:  Constant Progression:  Waxing and waning Chronicity:  New Context: activity   Relieved by:  Nothing Worsened by:  Nothing Ineffective treatments:  None tried Associated symptoms: no abdominal pain, no chest pain, no cough, no headaches and no rash       Past Medical History:  Diagnosis Date   Acute diastolic CHF (congestive heart failure) ([place]) 06/11/2012   EF 50-55% [facility])   Chronic kidney disease    COPD (chronic obstructive pulmonary disease) ([place])    Diabetes mellitus    A1c 11.5 (06/11/2012).   Dyspnea    Gout    Hepatic steatosis 06/11/2012   Elevated LFTs   Hyperlipemia    Malignant hypertension    Microcytic anemia 06/12/2012   Obesity     Patient Active Problem List   Diagnosis Date Noted   NSTEMI (non-ST elevated myocardial infarction) ([place]) 05/17/2021   Respiratory failure with hypoxia ([place]) 05/14/2021   Acute respiratory failure with hypoxia and hypercapnia ([place]) 05/14/2021   Mixed hyperlipidemia 08/13/2020   Acute gout of right knee 08/03/2019   Orthostasis    Muscle strain of right wrist    Pain    Physical debility 07/16/2019   ESRD on dialysis [facility])    Anemia of chronic disease    Uncontrolled type 2 diabetes mellitus with hyperglycemia (HCC)    Congestive heart failure (HCC)    Volume overload 07/05/2019   Anasarca 07/03/2019   Dyslipidemia 07/03/2019    Chronic combined systolic and diastolic CHF (congestive heart failure) ([place]) 07/03/2019   Bilateral leg edema    CKD (chronic kidney disease) stage 5, GFR less than 15 ml/min ([place]) 06/22/2019   Uncontrolled type 2 diabetes mellitus with hyperglycemia, with long-term current use of insulin ([place]) 06/22/2019   Hypokalemia    Thromboembolism ([place]) 02/20/2019   AKI (acute kidney injury) ([place]) 02/20/2019   Acute on chronic diastolic CHF (congestive heart failure) ([place]) 03/26/2018   Acute respiratory failure with hypoxia ([place]) 03/26/2018   Uncontrolled type 2 diabetes mellitus with stage 5 chronic kidney disease ([place]) 06/12/2012   Hyponatremia 06/12/2012   Microcytic anemia 06/12/2012   Essential hypertension, benign 06/11/2012   Elevated LFTs 06/11/2012   Morbid obesity ([place]) 06/11/2012   Hepatic steatosis 06/11/2012    Past Surgical History:  Procedure Laterality Date   AV FISTULA PLACEMENT Left 11/11/2019   Procedure: ARTERIOVENOUS (AV) FISTULA CREATION LEFT BRACHIOCEPHALIC ARM;  Surgeon: Serafina Mitchell, MD;  Location: [facility] OR;  Service: Vascular;  Laterality: Left;   IR FLUORO GUIDE CV LINE RIGHT  07/08/2019   IR REMOVAL TUN CV CATH W/O FL  03/23/2020   IR US GUIDE VASC ACCESS RIGHT  07/08/2019   NO PAST SURGERIES     VASCULAR SURGERY         Family History  Problem Relation Age of Onset   Gout Mother    Asthma  Mother    Diabetes Father    Heart failure Father    Diabetes Sister    Hypertension Brother    Pancreatic cancer Brother    Diabetes Sister     Social History   Tobacco Use   Smoking status: Never   Smokeless tobacco: Never  Vaping Use   Vaping Use: Never used  Substance Use Topics   Alcohol use: Not Currently    Comment: haven't drank in over 5 years    Drug use: No    Home Medications Prior to Admission medications   Medication Sig Start Date End Date Taking? Authorizing Provider  acetaminophen (TYLENOL) 500 MG tablet Take 1,000 mg by mouth every 6 (six)  hours as needed for moderate pain or headache.   Yes [provider]  albuterol (VENTOLIN HFA) 108 (90 Base) MCG/ACT inhaler Inhale 2 puffs into the lungs every 4 (four) hours as needed for wheezing or shortness of breath.  03/24/19  Yes [provider]  allopurinol (ZYLOPRIM) 100 MG tablet Take 2 tablets (200 mg total) by mouth daily. 08/04/19  Yes Love, Ivan Anchors, PA-C  amLODipine (NORVASC) 10 MG tablet Take 0.5 tablets (5 mg total) by mouth daily. 05/16/21 06/15/21 Yes Shahmehdi, Valeria Batman, MD  aspirin EC 81 MG EC tablet Take 1 tablet (81 mg total) by mouth daily. 08/04/19  Yes Love, Ivan Anchors, PA-C  atorvastatin (LIPITOR) 80 MG tablet Take 1 tablet (80 mg total) by mouth daily. 12/05/19  Yes Branch, Alphonse Guild, MD  Calcium Carbonate Antacid (CALCIUM CARBONATE, DOSED IN MG ELEMENTAL CALCIUM,) 1250 MG/5ML SUSP Take 5 mLs (500 mg of elemental calcium total) by mouth every 6 (six) hours as needed for indigestion. 07/16/19  Yes Amin, Jeanella Flattery, MD  carvedilol (COREG) 25 MG tablet Take 1 tablet (25 mg total) by mouth 2 (two) times daily with a meal. 08/03/19  Yes Love, Ivan Anchors, PA-C  cloNIDine (CATAPRES) 0.3 MG tablet Take 1 tablet (0.3 mg total) by mouth 3 (three) times daily. 08/03/19 05/17/21 Yes Love, Ivan Anchors, PA-C  ezetimibe (ZETIA) 10 MG tablet Take 10 mg by mouth every evening. 01/20/19  Yes [provider]  fluticasone (FLONASE) 50 MCG/ACT nasal spray Place 1 spray into both nostrils daily. Patient taking differently: Place 1 spray into both nostrils daily as needed for allergies. 04/04/18 05/17/21 Yes Shah, Pratik D, DO  furosemide (LASIX) 80 MG tablet Take 1 tablet (80 mg total) by mouth daily. 05/16/21 06/15/21 Yes Shahmehdi, Valeria Batman, MD  gabapentin (NEURONTIN) 100 MG capsule Take 1 capsule (100 mg total) by mouth 3 (three) times daily. 08/03/19  Yes Love, Ivan Anchors, PA-C  insulin detemir (LEVEMIR FLEXTOUCH) 100 UNIT/ML FlexPen Inject 50 Units into the skin at bedtime. 03/11/21  Yes  Cassandria Anger, MD  linaclotide Rolan Lipa) 145 MCG CAPS capsule Take 1 capsule (145 mcg total) by mouth daily before breakfast. 04/05/18  Yes Manuella Ghazi, Pratik D, DO  multivitamin (RENA-VIT) TABS tablet Take 1 tablet by mouth at bedtime. 08/03/19  Yes Love, Ivan Anchors, PA-C  Na Sulfate-K Sulfate-Mg Sulf (SUPREP BOWEL PREP KIT) 17.5-3.13-1.6 GM/177ML SOLN Take 1 kit by mouth as directed. 04/11/21  Yes Wohl, Darren, MD  NOVOLOG FLEXPEN 100 UNIT/ML FlexPen INJECT 8 TO 14 UNITS SUBCUTANEOUSLY THREE TIMES DAILY WITH MEALS Patient taking differently: Inject 8 to 14 subcutaneously 3 times a day after meals sliding scale 03/11/21  Yes Nida, Marella Chimes, MD  oxyCODONE-acetaminophen (PERCOCET) 5-325 MG tablet Take 1-2 tablets by mouth every 6 (  six) hours as needed. 04/24/21  Yes Delo, Nathaneil Canary, MD  OZEMPIC, 0.25 OR 0.5 MG/DOSE, 2 MG/1.5ML SOPN Inject 0.25 mg into the skin once a week. 11/23/20  Yes [provider]  predniSONE (DELTASONE) 10 MG tablet Take 2 tablets (20 mg total) by mouth 2 (two) times daily. 04/24/21  Yes Delo, Nathaneil Canary, MD  sevelamer carbonate (RENVELA) 800 MG tablet Take 3 tablets (2,400 mg total) by mouth 3 (three) times daily with meals. 08/03/19  Yes Love, Ivan Anchors, PA-C  SSD 1 % cream Apply topically. 05/13/21  Yes [provider]  valACYclovir (VALTREX) 1000 MG tablet Take 1 tablet (1,000 mg total) by mouth 3 (three) times daily. 04/24/21  Yes Delo, Nathaneil Canary, MD  amoxicillin (AMOXIL) 500 MG capsule Take 500 mg by mouth 2 (two) times daily. Patient not taking: Reported on 05/17/2021 05/13/21   [provider]  diclofenac sodium (VOLTAREN) 1 % GEL Apply 2 g topically 4 (four) times daily. Patient taking differently: Apply 2 g topically 4 (four) times daily as needed (pain). 08/03/19   Love, Ivan Anchors, PA-C  glucose blood (ACCU-CHEK AVIVA PLUS) test strip Use as instructed TO CHECK BLOOD GLUCOSE THREE TIMES DAILY 08/15/20   Nida, Marella Chimes, MD  HYDROcodone-acetaminophen  (NORCO/VICODIN) 5-325 MG tablet Take 1 tablet by mouth every 6 (six) hours as needed for severe pain. Patient not taking: No sig reported 11/11/19   Dagoberto Ligas, PA-C  Insulin Pen Needle (B-D ULTRAFINE III SHORT PEN) 31G X 8 MM MISC 1 each by Does not apply route as directed. 08/13/20   Cassandria Anger, MD    Allergies    Lisinopril and Hydralazine  Review of Systems   Review of Systems  Constitutional:  Negative for appetite change and fatigue.  HENT:  Negative for congestion, ear discharge and sinus pressure.   Eyes:  Negative for discharge.  Respiratory:  Positive for shortness of breath. Negative for cough.   Cardiovascular:  Negative for chest pain.  Gastrointestinal:  Negative for abdominal pain and diarrhea.  Genitourinary:  Negative for frequency and hematuria.  Musculoskeletal:  Negative for back pain.  Skin:  Negative for rash.  Neurological:  Negative for seizures and headaches.  Psychiatric/Behavioral:  Negative for hallucinations.    Physical Exam Updated Vital Signs BP (!) 162/99   Pulse 89   Temp 98.2 F (36.8 C) (Oral)   Resp 19   Ht 6' (1.829 m)   Wt (!) 136.8 kg   SpO2 100%   BMI 40.89 kg/m   Physical Exam Constitutional:      Appearance: He is well-developed.  HENT:     Head: Normocephalic.     Nose: Nose normal.  Eyes:     General: No scleral icterus.    Conjunctiva/sclera: Conjunctivae normal.  Neck:     Thyroid: No thyromegaly.  Cardiovascular:     Rate and Rhythm: Normal rate and regular rhythm.     Heart sounds: No murmur heard.   No friction rub. No gallop.  Pulmonary:     Breath sounds: No stridor. No wheezing or rales.  Chest:     Chest wall: No tenderness.  Abdominal:     General: There is no distension.     Tenderness: There is no abdominal tenderness. There is no rebound.  Musculoskeletal:        General: Normal range of motion.     Cervical back: Neck supple.  Lymphadenopathy:     Cervical: No cervical adenopathy.   Skin:  Findings: No erythema or rash.  Neurological:     Mental Status: He is alert and oriented to person, place, and time.     Motor: No abnormal muscle tone.     Coordination: Coordination normal.  Psychiatric:        Behavior: Behavior normal.    ED Results / Procedures / Treatments   Labs (all labs ordered are listed, but only abnormal results are displayed) Labs Reviewed  CBC WITH DIFFERENTIAL/PLATELET - Abnormal; Notable for the following components:      Result Value   RBC 3.33 (*)    Hemoglobin 9.8 (*)    HCT 30.9 (*)    RDW 16.7 (*)    All other components within normal limits  COMPREHENSIVE METABOLIC PANEL - Abnormal; Notable for the following components:   Chloride 96 (*)    Glucose, Bld 138 (*)    BUN 61 (*)    Creatinine, Ser 8.07 (*)    Calcium 8.6 (*)    AST 12 (*)    GFR, Estimated 7 (*)    All other components within normal limits  BRAIN NATRIURETIC PEPTIDE - Abnormal; Notable for the following components:   B Natriuretic Peptide 199.0 (*)    All other components within normal limits  TROPONIN I (HIGH SENSITIVITY) - Abnormal; Notable for the following components:   Troponin I (High Sensitivity) 112 (*)    All other components within normal limits  RESP PANEL BY RT-PCR (FLU A&B, COVID) ARPGX2  BLOOD GAS, VENOUS  TROPONIN I (HIGH SENSITIVITY)    EKG None  Radiology DG Chest 2 View  Result Date: 05/17/2021 CLINICAL DATA:  Shortness of breath.  Progressive.  History of COPD. EXAM: CHEST - 2 VIEW COMPARISON:  Radiograph 05/15/2021, 2 days ago. FINDINGS: Mild cardiomegaly stable. Unchanged mediastinal contours. Peribronchial thickening with increase from prior exam. Background vascular congestion. No septal thickening. No confluent airspace disease. Stable osseous structures with mild chronic wedging of lower thoracic vertebra IMPRESSION: Similar mild cardiomegaly. Vascular congestion. Peribronchial thickening with increase from prior exam, may be  pulmonary edema or bronchitis. Electronically Signed   By: Keith Rake M.D.   On: 05/17/2021 20:10    Procedures Procedures   Medications Ordered in ED Medications  albuterol (VENTOLIN HFA) 108 (90 Base) MCG/ACT inhaler 2 puff (has no administration in time range)    ED Course  I have reviewed the triage vital signs and the nursing notes.  Pertinent labs & imaging results that were available during my care of the patient were reviewed by me and considered in my medical decision making (see chart for details).   CRITICAL CARE Performed by: Milton Ferguson Total critical care time: 40 minutes Critical care time was exclusive of separately billable procedures and treating other patients. Critical care was necessary to treat or prevent imminent or life-threatening deterioration. Critical care was time spent personally by me on the following activities: development of treatment plan with patient and/or surrogate as well as nursing, discussions with consultants, evaluation of patient's response to treatment, examination of patient, obtaining history from patient or surrogate, ordering and performing treatments and interventions, ordering and review of laboratory studies, ordering and review of radiographic studies, pulse oximetry and re-evaluation of patient's condition.  MDM Rules/Calculators/A&P                           Patient with new inverted T waves on his EKG and shortness of breath.  Patient also has  congestive heart failure but improved from few days ago.  I spoke with cardiology and they recommended admission but would hold off on IV heparin unless his troponins rise significantly or he has EKG evolving changes.  Nephrology has been consulted and will see the patient tomorrow for dialysis.  Patient will be admitted to medicine Final Clinical Impression(s) / ED Diagnoses Final diagnoses:  Dyspnea on exertion    Rx / DC Orders ED Discharge Orders     None        Milton Ferguson, MD 05/17/21 2247

## 2021-05-18 ENCOUNTER — Encounter (HOSPITAL_COMMUNITY): Payer: Self-pay | Admitting: Family Medicine

## 2021-05-18 DIAGNOSIS — I200 Unstable angina: Secondary | ICD-10-CM

## 2021-05-18 DIAGNOSIS — I10 Essential (primary) hypertension: Secondary | ICD-10-CM | POA: Diagnosis not present

## 2021-05-18 DIAGNOSIS — Z20822 Contact with and (suspected) exposure to covid-19: Secondary | ICD-10-CM | POA: Diagnosis not present

## 2021-05-18 DIAGNOSIS — Z7982 Long term (current) use of aspirin: Secondary | ICD-10-CM | POA: Diagnosis not present

## 2021-05-18 DIAGNOSIS — N186 End stage renal disease: Secondary | ICD-10-CM | POA: Diagnosis not present

## 2021-05-18 DIAGNOSIS — E785 Hyperlipidemia, unspecified: Secondary | ICD-10-CM | POA: Diagnosis not present

## 2021-05-18 DIAGNOSIS — E871 Hypo-osmolality and hyponatremia: Secondary | ICD-10-CM | POA: Diagnosis not present

## 2021-05-18 DIAGNOSIS — J810 Acute pulmonary edema: Secondary | ICD-10-CM | POA: Diagnosis not present

## 2021-05-18 DIAGNOSIS — I132 Hypertensive heart and chronic kidney disease with heart failure and with stage 5 chronic kidney disease, or end stage renal disease: Secondary | ICD-10-CM | POA: Diagnosis not present

## 2021-05-18 DIAGNOSIS — Z992 Dependence on renal dialysis: Secondary | ICD-10-CM | POA: Diagnosis not present

## 2021-05-18 DIAGNOSIS — E1122 Type 2 diabetes mellitus with diabetic chronic kidney disease: Secondary | ICD-10-CM | POA: Diagnosis not present

## 2021-05-18 DIAGNOSIS — I5042 Chronic combined systolic (congestive) and diastolic (congestive) heart failure: Secondary | ICD-10-CM | POA: Diagnosis not present

## 2021-05-18 DIAGNOSIS — Z79899 Other long term (current) drug therapy: Secondary | ICD-10-CM | POA: Diagnosis not present

## 2021-05-18 DIAGNOSIS — D631 Anemia in chronic kidney disease: Secondary | ICD-10-CM | POA: Diagnosis not present

## 2021-05-18 DIAGNOSIS — D72829 Elevated white blood cell count, unspecified: Secondary | ICD-10-CM | POA: Diagnosis not present

## 2021-05-18 DIAGNOSIS — Z7984 Long term (current) use of oral hypoglycemic drugs: Secondary | ICD-10-CM | POA: Diagnosis not present

## 2021-05-18 DIAGNOSIS — R0600 Dyspnea, unspecified: Secondary | ICD-10-CM | POA: Diagnosis present

## 2021-05-18 DIAGNOSIS — Z794 Long term (current) use of insulin: Secondary | ICD-10-CM | POA: Diagnosis not present

## 2021-05-18 DIAGNOSIS — J9601 Acute respiratory failure with hypoxia: Secondary | ICD-10-CM | POA: Diagnosis not present

## 2021-05-18 DIAGNOSIS — J449 Chronic obstructive pulmonary disease, unspecified: Secondary | ICD-10-CM | POA: Diagnosis not present

## 2021-05-18 LAB — CBC WITH DIFFERENTIAL/PLATELET
Abs Immature Granulocytes: 0.01 10*3/uL (ref 0.00–0.07)
Basophils Absolute: 0 10*3/uL (ref 0.0–0.1)
Basophils Relative: 1 %
Eosinophils Absolute: 0.4 10*3/uL (ref 0.0–0.5)
Eosinophils Relative: 7 %
HCT: 30.9 % — ABNORMAL LOW (ref 39.0–52.0)
Hemoglobin: 9.8 g/dL — ABNORMAL LOW (ref 13.0–17.0)
Immature Granulocytes: 0 %
Lymphocytes Relative: 36 %
Lymphs Abs: 2.1 10*3/uL (ref 0.7–4.0)
MCH: 29.3 pg (ref 26.0–34.0)
MCHC: 31.7 g/dL (ref 30.0–36.0)
MCV: 92.2 fL (ref 80.0–100.0)
Monocytes Absolute: 0.6 10*3/uL (ref 0.1–1.0)
Monocytes Relative: 10 %
Neutro Abs: 2.7 10*3/uL (ref 1.7–7.7)
Neutrophils Relative %: 46 %
Platelets: 265 10*3/uL (ref 150–400)
RBC: 3.35 MIL/uL — ABNORMAL LOW (ref 4.22–5.81)
RDW: 16.7 % — ABNORMAL HIGH (ref 11.5–15.5)
WBC: 5.9 10*3/uL (ref 4.0–10.5)
nRBC: 0 % (ref 0.0–0.2)

## 2021-05-18 LAB — LIPID PANEL
Cholesterol: 96 mg/dL (ref 0–200)
Cholesterol: 100 mg/dL (ref 0–200)
HDL: 38 mg/dL — ABNORMAL LOW (ref >40)
HDL: 40 mg/dL — ABNORMAL LOW (ref >40)
LDL Cholesterol: 32 mg/dL (ref 0–99)
LDL Cholesterol: 34 mg/dL (ref 0–99)
Total CHOL/HDL Ratio: 2.5 RATIO
Total CHOL/HDL Ratio: 2.5 RATIO
Triglycerides: 128 mg/dL (ref <150)
Triglycerides: 131 mg/dL (ref <150)
VLDL: 26 mg/dL (ref 0–40)
VLDL: 26 mg/dL (ref 0–40)

## 2021-05-18 LAB — COMPREHENSIVE METABOLIC PANEL
ALT: 16 U/L (ref 0–44)
AST: 13 U/L — ABNORMAL LOW (ref 15–41)
Albumin: 3.1 g/dL — ABNORMAL LOW (ref 3.5–5.0)
Alkaline Phosphatase: 45 U/L (ref 38–126)
Anion gap: 13 (ref 5–15)
BUN: 65 mg/dL — ABNORMAL HIGH (ref 6–20)
CO2: 25 mmol/L (ref 22–32)
Calcium: 8.4 mg/dL — ABNORMAL LOW (ref 8.9–10.3)
Chloride: 96 mmol/L — ABNORMAL LOW (ref 98–111)
Creatinine, Ser: 8.66 mg/dL — ABNORMAL HIGH (ref 0.61–1.24)
GFR, Estimated: 7 mL/min — ABNORMAL LOW (ref >60)
Glucose, Bld: 125 mg/dL — ABNORMAL HIGH (ref 70–99)
Potassium: 4.1 mmol/L (ref 3.5–5.1)
Sodium: 134 mmol/L — ABNORMAL LOW (ref 135–145)
Total Bilirubin: 0.6 mg/dL (ref 0.3–1.2)
Total Protein: 6.3 g/dL — ABNORMAL LOW (ref 6.5–8.1)

## 2021-05-18 LAB — MAGNESIUM: Magnesium: 2.3 mg/dL (ref 1.7–2.4)

## 2021-05-18 LAB — CBC

## 2021-05-18 LAB — TROPONIN I (HIGH SENSITIVITY): Troponin I (High Sensitivity): 98 ng/L — ABNORMAL HIGH (ref <18)

## 2021-05-18 LAB — GLUCOSE, CAPILLARY
Glucose-Capillary: 151 mg/dL — ABNORMAL HIGH (ref 70–99)
Glucose-Capillary: 218 mg/dL — ABNORMAL HIGH (ref 70–99)

## 2021-05-18 LAB — PHOSPHORUS: Phosphorus: 5.5 mg/dL — ABNORMAL HIGH (ref 2.5–4.6)

## 2021-05-18 MED ORDER — ALBUTEROL SULFATE (2.5 MG/3ML) 0.083% IN NEBU: 2.5000 mg | INHALATION_SOLUTION | RESPIRATORY_TRACT | Status: DC | PRN

## 2021-05-18 MED ORDER — FUROSEMIDE 10 MG/ML IJ SOLN
40.0000 mg | Freq: Two times a day (BID) | INTRAMUSCULAR | Status: DC
  Administered 2021-05-18 (×2): 40 mg via INTRAVENOUS
  Filled 2021-05-18 (×2): qty 4

## 2021-05-18 MED ORDER — CHLORHEXIDINE GLUCONATE CLOTH 2 % EX PADS: 6.0000 | MEDICATED_PAD | Freq: Every day | CUTANEOUS | Status: DC

## 2021-05-18 NOTE — Consult Note (Signed)
Renal Service Consult Note [facility]  Kelly Key 05/18/2021 Kelly Blazing, MD Requesting Physician: Dr. Horris Latino  Reason for Consult: ESRD pt w/ SOB HPI: The patient is a 50 y.o. year-old w/ hxh of ESRD, COPD, DM2, HL, HTN, obesity presented to ED w/ SOB last night. ED eval showed Hb 9.8, CXR showed vasc congestion +/- bronchitis / early edema. Trop's were flat. Pt was admitted. Asked to see for dialysis.  CXR to my ready doesn't show any pulm edema or sig vasc congestion. Pt seen on HD. Got 3.5 L off. Pt says SOB is much better.  We weighed him post and per the patient he is now down 7 lbs (dry wt 286 lbs, post HD wt today 279 lbs). He says he is trying to lose wt to get a transplant.     ROS - denies CP, no joint pain, no HA, no blurry vision, no rash, no diarrhea, no nausea/ vomiting, no dysuria, no difficulty voiding   Past Medical History  Past Medical History:  Diagnosis Date   Acute diastolic CHF (congestive heart failure) ([place]) 06/11/2012   EF 50-55% [facility])   Chronic kidney disease    COPD (chronic obstructive pulmonary disease) ([place])    Diabetes mellitus    A1c 11.5 (06/11/2012).   Dyspnea    Gout    Hepatic steatosis 06/11/2012   Elevated LFTs   Hyperlipemia    Malignant hypertension    Microcytic anemia 06/12/2012   Obesity    Past Surgical History  Past Surgical History:  Procedure Laterality Date   AV FISTULA PLACEMENT Left 11/11/2019   Procedure: ARTERIOVENOUS (AV) FISTULA CREATION LEFT BRACHIOCEPHALIC ARM;  Surgeon: Serafina Mitchell, MD;  Location: [facility] OR;  Service: Vascular;  Laterality: Left;   IR FLUORO GUIDE CV LINE RIGHT  07/08/2019   IR REMOVAL TUN CV CATH W/O FL  03/23/2020   IR US GUIDE VASC ACCESS RIGHT  07/08/2019   NO PAST SURGERIES     VASCULAR SURGERY     Family History  Family History  Problem Relation Age of Onset   Gout Mother    Asthma Mother    Diabetes Father    Heart failure Father    Diabetes Sister     Hypertension Brother    Pancreatic cancer Brother    Diabetes Sister    Social History  reports that he has never smoked. He has never used smokeless tobacco. He reports previous alcohol use. He reports that he does not use drugs. Allergies  Allergies  Allergen Reactions   Lisinopril Swelling   Hydralazine Anxiety    Hallucinations    Home medications Prior to Admission medications   Medication Sig Start Date End Date Taking? Authorizing Provider  acetaminophen (TYLENOL) 500 MG tablet Take 1,000 mg by mouth every 6 (six) hours as needed for moderate pain or headache.   Yes [provider]  albuterol (VENTOLIN HFA) 108 (90 Base) MCG/ACT inhaler Inhale 2 puffs into the lungs every 4 (four) hours as needed for wheezing or shortness of breath.  03/24/19  Yes [provider]  allopurinol (ZYLOPRIM) 100 MG tablet Take 2 tablets (200 mg total) by mouth daily. 08/04/19  Yes Love, Ivan Anchors, PA-C  amLODipine (NORVASC) 10 MG tablet Take 0.5 tablets (5 mg total) by mouth daily. 05/16/21 06/15/21 Yes Shahmehdi, Valeria Batman, MD  aspirin EC 81 MG EC tablet Take 1 tablet (81 mg total) by mouth daily. 08/04/19  Yes Love, Ivan Anchors,  PA-C  atorvastatin (LIPITOR) 80 MG tablet Take 1 tablet (80 mg total) by mouth daily. 12/05/19  Yes Branch, Alphonse Guild, MD  Calcium Carbonate Antacid (CALCIUM CARBONATE, DOSED IN MG ELEMENTAL CALCIUM,) 1250 MG/5ML SUSP Take 5 mLs (500 mg of elemental calcium total) by mouth every 6 (six) hours as needed for indigestion. 07/16/19  Yes Amin, Jeanella Flattery, MD  carvedilol (COREG) 25 MG tablet Take 1 tablet (25 mg total) by mouth 2 (two) times daily with a meal. 08/03/19  Yes Love, Ivan Anchors, PA-C  cloNIDine (CATAPRES) 0.3 MG tablet Take 1 tablet (0.3 mg total) by mouth 3 (three) times daily. 08/03/19 05/17/21 Yes Love, Ivan Anchors, PA-C  ezetimibe (ZETIA) 10 MG tablet Take 10 mg by mouth every evening. 01/20/19  Yes [provider]  fluticasone (FLONASE) 50 MCG/ACT nasal spray  Place 1 spray into both nostrils daily. Patient taking differently: Place 1 spray into both nostrils daily as needed for allergies. 04/04/18 05/17/21 Yes Shah, Pratik D, DO  furosemide (LASIX) 80 MG tablet Take 1 tablet (80 mg total) by mouth daily. 05/16/21 06/15/21 Yes Shahmehdi, Valeria Batman, MD  gabapentin (NEURONTIN) 100 MG capsule Take 1 capsule (100 mg total) by mouth 3 (three) times daily. 08/03/19  Yes Love, Ivan Anchors, PA-C  insulin detemir (LEVEMIR FLEXTOUCH) 100 UNIT/ML FlexPen Inject 50 Units into the skin at bedtime. 03/11/21  Yes Cassandria Anger, MD  linaclotide Rolan Lipa) 145 MCG CAPS capsule Take 1 capsule (145 mcg total) by mouth daily before breakfast. 04/05/18  Yes Manuella Ghazi, Pratik D, DO  multivitamin (RENA-VIT) TABS tablet Take 1 tablet by mouth at bedtime. 08/03/19  Yes Love, Ivan Anchors, PA-C  Na Sulfate-K Sulfate-Mg Sulf (SUPREP BOWEL PREP KIT) 17.5-3.13-1.6 GM/177ML SOLN Take 1 kit by mouth as directed. 04/11/21  Yes Wohl, Darren, MD  NOVOLOG FLEXPEN 100 UNIT/ML FlexPen INJECT 8 TO 14 UNITS SUBCUTANEOUSLY THREE TIMES DAILY WITH MEALS Patient taking differently: Inject 8 to 14 subcutaneously 3 times a day after meals sliding scale 03/11/21  Yes Nida, Marella Chimes, MD  oxyCODONE-acetaminophen (PERCOCET) 5-325 MG tablet Take 1-2 tablets by mouth every 6 (six) hours as needed. 04/24/21  Yes Delo, Nathaneil Canary, MD  OZEMPIC, 0.25 OR 0.5 MG/DOSE, 2 MG/1.5ML SOPN Inject 0.25 mg into the skin once a week. 11/23/20  Yes [provider]  predniSONE (DELTASONE) 10 MG tablet Take 2 tablets (20 mg total) by mouth 2 (two) times daily. 04/24/21  Yes Delo, Nathaneil Canary, MD  sevelamer carbonate (RENVELA) 800 MG tablet Take 3 tablets (2,400 mg total) by mouth 3 (three) times daily with meals. 08/03/19  Yes Love, Ivan Anchors, PA-C  SSD 1 % cream Apply topically. 05/13/21  Yes [provider]  valACYclovir (VALTREX) 1000 MG tablet Take 1 tablet (1,000 mg total) by mouth 3 (three) times daily. 04/24/21  Yes Delo,  Nathaneil Canary, MD  amoxicillin (AMOXIL) 500 MG capsule Take 500 mg by mouth 2 (two) times daily. Patient not taking: Reported on 05/17/2021 05/13/21   [provider]  diclofenac sodium (VOLTAREN) 1 % GEL Apply 2 g topically 4 (four) times daily. Patient taking differently: Apply 2 g topically 4 (four) times daily as needed (pain). 08/03/19   Love, Ivan Anchors, PA-C  glucose blood (ACCU-CHEK AVIVA PLUS) test strip Use as instructed TO CHECK BLOOD GLUCOSE THREE TIMES DAILY 08/15/20   Nida, Marella Chimes, MD  HYDROcodone-acetaminophen (NORCO/VICODIN) 5-325 MG tablet Take 1 tablet by mouth every 6 (six) hours as needed for severe pain. Patient not taking: No sig  reported 11/11/19   Dagoberto Ligas, PA-C  Insulin Pen Needle (B-D ULTRAFINE III SHORT PEN) 31G X 8 MM MISC 1 each by Does not apply route as directed. 08/13/20   Cassandria Anger, MD     Vitals:   05/18/21 0057 05/18/21 0104 05/18/21 0428 05/18/21 0802  BP:  (!) 163/90 121/73 (!) 156/89  Pulse:  90 80 82  Resp:   17 18  Temp:  97.6 F (36.4 C) 97.8 F (36.6 C) 97.9 F (36.6 C)  TempSrc:  Oral Oral Oral  SpO2:  100% 99%   Weight: (!) 137.1 kg     Height: 6' (1.829 m)      Exam Gen alert, no distress No rash, cyanosis or gangrene Sclera anicteric, throat clear  No jvd or bruits Chest clear bilat to bases, no rales/ wheezing RRR no MRG Abd soft ntnd no mass or ascites +bs GU normal MS no joint effusions or deformity Ext no LE or UE edema, no wounds or ulcers Neuro is alert, Ox 3 , nf LUE AVF+bruit    [facility] 4h 15 min 3K/2.5Ca EDW 135kg.     OP HD: [facility] 4h 29mn  3K/ 2.5 bath  135kg  Heparin 5600   Assessment/ Plan: SOB - trop's flat, borderline vasc congestion on CXR.  BP's are up and suspect some degree of volume overload.  Had HD today w/ 3.5 L off and is on no O2 and SOB better. OK for dc home.  ESRD - usual HD today.  He will notify his home HD unit about drop in dry wt (trying to lose wt  for transplant purposes).  HTN - on 3 BP meds at home, continue, lower volume w/ HD today Anemia ckd - Hb 9-11 here, no need for esa at this time MBD ckd - Ca ok, cont binder DM2 - on insulin      Kelly Kamarius Buckbee  MD 05/18/2021, 10:18 AM  Recent Labs  Lab 05/16/21 0413 05/17/21 2006  WBC 8.3 7.5  HGB 11.5* 9.8*   Recent Labs  Lab 05/16/21 0413 05/17/21 2006  K 3.9 4.4  BUN 52* 61*  CREATININE 6.39* 8.07*  CALCIUM 8.6* 8.6*

## 2021-05-18 NOTE — Progress Notes (Signed)
Patient expressed desire to leave the hospital against medical advice. Dr. Horris Latino was notified and spoke with patient. AMA form was given to patient. Patient signed AMA form; signature witnessed by this RN.

## 2021-05-18 NOTE — Discharge Instructions (Addendum)
Your provider has recommended a cardiac catherization  You are recommended for a cardiac catheterization - timing TBD.  Please arrive at the [facility] ([employer]) at [facility] at [address] [place] Stay on the day of your procedure    Special note: Every effort is made to have your procedure done on time.   Please understand that emergencies sometimes delay a scheduled   procedure.  No solid foods after midnight the night before your procedure. On the morning of your procedure, take your medications with a sip of water.   Please take a baby aspirin (81 mg) on the morning of your procedure.   Medications to HOLD - you will be advised on medications to hold prior to your procedure  Plan for a one night stay -- bring personal belongings.  Bring a current list of your medications and current insurance cards.  You MUST have a responsible person to drive you home. Someone MUST be with you the first 24 hours after you arrive home or your discharge will be delayed. Wear clothes that are easy to get on and off and wear slip on shoes.    Coronary Angiogram A coronary angiogram, also called coronary angiography, is an X-ray procedure used to look at the arteries in the heart. In this procedure, a dye (contrast dye) is injected through a long, hollow tube (catheter). The catheter is about the size of a piece of cooked spaghetti and is inserted through your groin, wrist, or arm. The dye is injected into each artery, and X-rays are then taken to show if there is a blockage in the arteries of your heart.  LET [facility] CARE PROVIDER KNOW ABOUT: Any allergies you have, including allergies to shellfish or contrast dye.   All medicines you are taking, including vitamins, herbs, eye drops, creams, and over-the-counter medicines.   Previous problems you or members of your family have had with the use of anesthetics.   Any blood disorders you have.   Previous  surgeries you have had. History of kidney problems or failure.   Other medical conditions you have.  RISKS AND COMPLICATIONS  Generally, a coronary angiogram is a safe procedure. However, about 1 person out of 1000 can have problems that may include: Allergic reaction to the dye. Bleeding/bruising from the access site or other locations. Kidney injury, especially in people with impaired kidney function.  Stroke (rare). Heart attack (rare). Irregular rhythms (rare) Death (rare)  BEFORE THE PROCEDURE  Do not eat or drink anything after midnight the night before the procedure or as directed by your health care provider.   Ask your health care provider about changing or stopping your regular medicines. This is especially important if you are taking diabetes medicines or blood thinners.  PROCEDURE You may be given a medicine to help you relax (sedative) before the procedure. This medicine is given through an intravenous (IV) access tube that is inserted into one of your veins.   The area where the catheter will be inserted will be washed and shaved. This is usually done in the groin but may be done in the fold of your arm (near your elbow) or in the wrist.    A medicine will be given to numb the area where the catheter will be inserted (local anesthetic).   The health care provider will insert the catheter into an artery. The catheter will be guided by using a special type of X-ray (fluoroscopy)  of the blood vessel being examined.   A special dye will then be injected into the catheter, and X-rays will be taken. The dye will help to show where any narrowing or blockages are located in the heart arteries.     AFTER THE PROCEDURE  If the procedure is done through the leg, you will be kept in bed lying flat for several hours. You will be instructed to not bend or cross your legs. The insertion site will be checked frequently.   The pulse in your feet or wrist will be checked frequently.    Additional blood tests, X-rays, and an electrocardiogram may be done.

## 2021-05-18 NOTE — Procedures (Signed)
   I was present at this dialysis session, have reviewed the session itself and made  appropriate changes Kelly Splinter MD [place] pager [phone]   05/18/2021, 5:19 PM

## 2021-05-18 NOTE — Discharge Summary (Signed)
Physician Discharge Summary [facility]    Patient: Kelly Key                   Admit date: 05/17/2021   DOB: 11/13/1970             Discharge date:05/18/2021/1:49 PM DP:[id number]                          PCP: [name], MD  Disposition: HOME / SNF / HOME with Home Health   Recommendations for Outpatient Follow-up:   Follow up: With nephrologist, and hemodialysis today 09/16/2021, also scheduled for hemodialysis tomorrow.[name] He is advised to continue with scheduled hemodialysis  Discharge Condition: Stable   Code Status:   Code Status: Full Code  Diet recommendation: Renal diet   Discharge Diagnoses:    Active Problems:   Essential hypertension, benign   Dyslipidemia   Chronic combined systolic and diastolic CHF (congestive heart failure) (HCC)   ESRD (end stage renal disease) ([place])   Uncontrolled type 2 diabetes mellitus with hyperglycemia (HCC)   NSTEMI (non-ST elevated myocardial infarction) ([place])   History of Present Illness/ Hospital Course Kelly Key Summary:    Kelly Key  is a 50 y.o. male with past medical history relevant for ESRD on hemodialysis Tuesdays Thursdays and Saturdays, malignant hypertension, HLD, morbid obesity, combined systolic and diastolic dysfunction CHF, COPD and diabetes mellitus who presents by EMS with shortness of breath -Patient has had shortness of breath for the last couple of days, got worse this morning, he attempted to go to his hemodialysis center--- while at hemodialysis center prior to initiation of HD he became more short of breath and hypoxic EMS was called to the HD center EMS placed patient on CPAP, gave nitro x3, EMS also gave Versed -BP was elevated at 260/150   ED:  In ED troponin is 24, COVID-negative -EKG with sinus tach up to 125, no acute ischemic changes -Chest x-ray with cardiomegaly and interstitial edema , pneumonitis not excluded -WBC 13.9, hemoglobin is 11.4 which is close to baseline -ABG with a PCO2 of  50 PO2 of 79 pH of 7.278 on FiO2 of 50% patient was placed on BiPAP -Sodium was 127, potassium is 4.4 creatinine 9.25 with a BUN of 99 and a glucose of 342 -Nephrology input from Dr. Posey Pronto appreciated with plans for emergent hemodialysis to address volume stat    1) acute hypoxic and hypercapnic respiratory failure/flash pulmonary edema -Due to volume overload, noncompliant with fluid restriction -Status post hemodialysis, hypoxia hypercapnia much improved -Satting 96% on 4 L of oxygen >>>> was weaned off oxygen successfully... Remained stable on room air   -Patient elevated BP on admission ( at 260/150) >>>> 151/68 today -Chest x-ray with interstitial edema and cardiomegaly -Clinically presenting with flash pulmonary edema -ABG reflective of uncompensated respiratory acidosis -ABG with a PCO2 of 50 PO2 of 79 pH of 7.278 on FiO2 of 50% patient was placed on BiPAP -COVID-negative - Echo:    2) Hypertensive crisis with flash pulmonary edema- -Improved respiratory distress, improved blood pressure, currently 144/87 -- on admission BP at 260/150 by EMS, repeat BP here 226/144 -Responded well to IV nitro drip and hemodialysis - -Versed and lorazepam -discontinued -Continue to titrate nitro drip now -Overall BP improving with above measures -Restarted home medications of amlodipine 10 mg daily, Coreg 25 mg twice daily, Lasix 80 mg daily, clonidine 0.3 mg 3 times daily -Appreciate nephrology input   3)HFpEF--Echo  from 06/16/2019 with EF of 50 to [id number], -Complicated by flash pulmonary edema, uncontrolled BP, accelerated BP -Volume overload, as managed above -2D echo-reviewed - troponin is 24, -EKG with sinus tach up to 125, no acute ischemic changes--- likely ischemic demand due to above   4)ESRD-- -Schedule hemodialysis T/T/S--- patient admits to noncompliance with fluid restriction over the weekend --Sodium was 127, potassium is 4.4 creatinine 9.25 with a BUN of 99 and a glucose of  342 -Nephrology input from Dr. Posey Pronto appreciated with plans -status post emergent hemodialysis,  -Status post hemodialysis yesterday, scheduled for hemodialysis today   -Anticipate that patient will require serial/back-to-back hemodialysis sessions to address volume status and hypoxia   5)DM2- - on admission blood glucose 342 >>> 264, 194, 283, 157  -Recent A1c 8.1 reflecting uncontrolled diabetes with hyperglycemia PTA -Checking CBG QA CHS, SSI coverage   6) chronic anemia of ESRD- - stable - hemoglobin 11.4 which is close to baseline, continue to monitor -EPO/ESA agent per nephrology team   7) leukocytosis- -- WBC 13.9 >> 9.1 today -  no cough, no fevers, chest x-ray consistent with interstitial edema, however cannot rule out pneumonitis, -Repeat chest x-ray on 05/15/2021. after hemodialysis -Hold off on antibiotics at this time   8) hyponatremia-- - sodium is 127 >>> 132 now   - stable    Patient was cleared from nephrology Dr. Hollie Salk standpoint to be discharged home to follow-up with hemodialysis on the same day of discharge 05/16/2021 at 11:30 AM      Dispo: The patient is from: Home              Anticipated d/c is to: Home    Discharge Instructions:      Allergies  Allergen Reactions   Lisinopril Swelling   Hydralazine Anxiety    Hallucinations      Procedures /Studies:   CT ABDOMEN PELVIS WO CONTRAST  Result Date: 04/20/2021 CLINICAL DATA:  Abdominal pain and vomiting for 2 days, unable to complete dialysis EXAM: CT ABDOMEN AND PELVIS WITHOUT CONTRAST TECHNIQUE: Multidetector CT imaging of the abdomen and pelvis was performed following the standard protocol without IV contrast. COMPARISON:  12/15/2020 FINDINGS: Lower chest: No acute abnormality. Coronary artery calcifications. Trace pericardial effusion, unchanged compared to prior examination Hepatobiliary: No solid liver abnormality is seen. No gallstones, gallbladder wall thickening, or biliary dilatation.  Pancreas: Unremarkable. No pancreatic ductal dilatation or surrounding inflammatory changes. Spleen: Normal in size without significant abnormality. Adrenals/Urinary Tract: Adrenal glands are unremarkable. Kidneys are normal, without renal calculi, solid lesion, or hydronephrosis. Mild thickening of the urinary bladder wall. Stomach/Bowel: Stomach is within normal limits. Appendix appears normal. No evidence of bowel wall thickening, distention, or inflammatory changes. Vascular/Lymphatic: Aortic atherosclerosis. No enlarged abdominal or pelvic lymph nodes. Reproductive: No mass or other significant abnormality. Other: Fat containing umbilical hernia.  No abdominopelvic ascites. Musculoskeletal: No acute or significant osseous findings. IMPRESSION: 1. No non-contrast CT findings of the abdomen or pelvis to explain abdominal pain or vomiting. 2. Mild thickening of the urinary bladder wall, suggestive of nonspecific infectious or inflammatory cystitis. Correlate with urinalysis. 3. Coronary artery disease. Aortic Atherosclerosis (ICD10-I70.0). Electronically Signed   By: Eddie Candle M.D.   On: 04/20/2021 14:27   DG Chest 2 View  Result Date: 05/17/2021 CLINICAL DATA:  Shortness of breath.  Progressive.  History of COPD. EXAM: CHEST - 2 VIEW COMPARISON:  Radiograph 05/15/2021, 2 days ago. FINDINGS: Mild cardiomegaly stable. Unchanged mediastinal contours. Peribronchial thickening with increase from prior  exam. Background vascular congestion. No septal thickening. No confluent airspace disease. Stable osseous structures with mild chronic wedging of lower thoracic vertebra IMPRESSION: Similar mild cardiomegaly. Vascular congestion. Peribronchial thickening with increase from prior exam, may be pulmonary edema or bronchitis. Electronically Signed   By: Keith Rake M.D.   On: 05/17/2021 20:10   DG Chest 2 View  Result Date: 05/15/2021 CLINICAL DATA:  Dyspnea EXAM: CHEST - 2 VIEW COMPARISON:  05/14/2021,  07/03/2019 FINDINGS: Borderline cardiomegaly with vascular congestion. No pleural effusion, consolidation, or pneumothorax. Mild chronic wedging of lower thoracic vertebra. IMPRESSION: Borderline cardiomegaly with vascular congestion. Overall appearance is improved as compared with 05/14/2021. Electronically Signed   By: Donavan Foil M.D.   On: 05/15/2021 20:05   DG Chest Port 1 View  Result Date: 05/14/2021 CLINICAL DATA:  Shortness of breath. EXAM: PORTABLE CHEST 1 VIEW COMPARISON:  04/20/2021. FINDINGS: Cardiomegaly with diffuse bilateral interstitial prominence consistent with interstitial edema. Pneumonitis cannot be excluded. Low lung volumes. No pleural effusion or pneumothorax. IMPRESSION: 1. Cardiomegaly with diffuse bilateral interstitial prominence consistent interstitial edema. Pneumonitis cannot be excluded. 2.  Low lung volumes. Electronically Signed   By: Marcello Moores  Register   On: 05/14/2021 08:34   DG Chest Port 1 View  Result Date: 04/20/2021 CLINICAL DATA:  Bibasilar rales.  History of COPD. EXAM: PORTABLE CHEST 1 VIEW COMPARISON:  07/03/2019 FINDINGS: The cardiac silhouette is borderline enlarged, accentuated by portable AP technique. There is mild peribronchial cuffing. No confluent airspace opacity, overt pulmonary edema, sizable pleural effusion, pneumothorax is identified. No acute osseous abnormality is seen. IMPRESSION: Mild peribronchial cuffing which could reflect bronchitis or vascular congestion. No airspace consolidation or overt pulmonary edema Electronically Signed   By: Logan Bores M.D.   On: 04/20/2021 13:03   ECHOCARDIOGRAM COMPLETE  Result Date: 05/15/2021    ECHOCARDIOGRAM REPORT   Patient Name:   MORTIMER PARMA Date of Exam: 05/15/2021 Medical Rec #:  [id number]    Height:       72.0 in Accession #:    [id number]   Weight:       300.3 lb Date of Birth:  18-Mar-1971     BSA:          2.532 m Patient Age:    69 years     BP:           113/62 mmHg Patient Gender: M             HR:           81 bpm. Exam Location:  [facility] Procedure: 2D Echo, Cardiac Doppler and Color Doppler Indications:    Dyspnea R06.00  History:        Patient has prior history of Echocardiogram examinations, most                 recent 06/23/2019. CHF, COPD; Risk Factors:Hypertension, Diabetes                 and Dyslipidemia. Morbid Obesity, Acute renal failure                 superimposed on stage 4 chronic kidney disease ([place]), ESRD on                 dialysis ([place]).  Sonographer:    Alvino Chapel RCS Referring Phys: [phone] COURAGE EMOKPAE IMPRESSIONS  1. Left ventricular ejection fraction, by estimation, is 55 to 60%. The left ventricle has normal function. The left ventricle demonstrates regional wall  motion abnormalities (see scoring diagram/findings for description). There is moderate left ventricular hypertrophy. Left ventricular diastolic parameters were normal.  2. Right ventricular systolic function is normal. The right ventricular size is normal. Tricuspid regurgitation signal is inadequate for assessing PA pressure.  3. There is a trivial pericardial effusion posterior to the left ventricle.  4. The mitral valve is grossly normal. Trivial mitral valve regurgitation.  5. The aortic valve is tricuspid. Aortic valve regurgitation is not visualized.  6. Aortic dilatation noted. There is moderate dilatation of the aortic root, measuring 44 mm.  7. The inferior vena cava is normal in size with greater than 50% respiratory variability, suggesting right atrial pressure of 3 mmHg. FINDINGS  Left Ventricle: Left ventricular ejection fraction, by estimation, is 55 to 60%. The left ventricle has normal function. The left ventricle demonstrates regional wall motion abnormalities. The left ventricular internal cavity size was normal in size. There is moderate left ventricular hypertrophy. Left ventricular diastolic parameters were normal.  LV Wall Scoring: The apical septal segment, apical inferior segment, and apex  are hypokinetic. The entire anterior wall, entire lateral wall, anterior septum, inferior wall, mid inferoseptal segment, and basal inferoseptal segment are normal. Right Ventricle: The right ventricular size is normal. No increase in right ventricular wall thickness. Right ventricular systolic function is normal. Tricuspid regurgitation signal is inadequate for assessing PA pressure. Left Atrium: Left atrial size was normal in size. Right Atrium: Right atrial size was normal in size. Pericardium: Trivial pericardial effusion is present. The pericardial effusion is posterior to the left ventricle. Mitral Valve: The mitral valve is grossly normal. Mild to moderate mitral annular calcification. Trivial mitral valve regurgitation. Tricuspid Valve: The tricuspid valve is grossly normal. Tricuspid valve regurgitation is trivial. Aortic Valve: The aortic valve is tricuspid. There is mild aortic valve annular calcification. Aortic valve regurgitation is not visualized. Pulmonic Valve: The pulmonic valve was grossly normal. Pulmonic valve regurgitation is trivial. Aorta: Aortic dilatation noted. There is moderate dilatation of the aortic root, measuring 44 mm. Venous: The inferior vena cava is normal in size with greater than 50% respiratory variability, suggesting right atrial pressure of 3 mmHg. IAS/Shunts: No atrial level shunt detected by color flow Doppler.  LEFT VENTRICLE PLAX 2D LVIDd:         5.20 cm  Diastology LVIDs:         3.40 cm  LV e' medial:    8.92 cm/s LV PW:         1.60 cm  LV E/e' medial:  11.2 LV IVS:        1.40 cm  LV e' lateral:   9.79 cm/s LVOT diam:     1.90 cm  LV E/e' lateral: 10.2 LV SV:         53 LV SV Index:   21 LVOT Area:     2.84 cm  RIGHT VENTRICLE RV S prime:     11.40 cm/s TAPSE (M-mode): 1.9 cm LEFT ATRIUM             Index       RIGHT ATRIUM           Index LA diam:        4.50 cm 1.78 cm/m  RA Area:     22.00 cm LA Vol (A2C):   84.2 ml 33.25 ml/m RA Volume:   69.00 ml  27.25  ml/m LA Vol (A4C):   75.1 ml 29.66 ml/m LA Biplane Vol: 80.9 ml 31.95 ml/m  AORTIC VALVE LVOT Vmax:   89.30 cm/s LVOT Vmean:  64.700 cm/s LVOT VTI:    0.188 m  AORTA Ao Root diam: 4.40 cm MITRAL VALVE MV Area (PHT): 2.80 cm     SHUNTS MV Decel Time: 271 msec     Systemic VTI:  0.19 m MV E velocity: 100.00 cm/s  Systemic Diam: 1.90 cm MV A velocity: 85.70 cm/s MV E/A ratio:  1.17 Rozann Lesches MD Electronically signed by Rozann Lesches MD Signature Date/Time: 05/15/2021/12:36:37 PM    Final     Subjective:   Patient was seen and examined 05/18/2021, 1:49 PM Patient stable today. No acute distress.  No issues overnight Stable for discharge.  Discharge Exam:    Vitals:   05/18/21 1138 05/18/21 1200 05/18/21 1230 05/18/21 1300  BP: (!) 195/113 (!) 177/101 (!) 178/102 (!) 174/103  Pulse: 80 78 74 75  Resp:      Temp:      TempSrc:      SpO2:      Weight:      Height:        General: Pt lying comfortably in bed & appears in no obvious distress. Cardiovascular: S1 & S2 heard, RRR, S1/S2 +. No murmurs, rubs, gallops or clicks. No JVD or pedal edema. Respiratory: Clear to auscultation without wheezing, rhonchi or crackles. No increased work of breathing. Abdominal:  Non-distended, non-tender & soft. No organomegaly or masses appreciated. Normal bowel sounds heard. CNS: Alert and oriented. No focal deficits. Extremities: no edema, no cyanosis      The results of significant diagnostics from this hospitalization (including imaging, microbiology, ancillary and laboratory) are listed below for reference.      Microbiology:   Recent Results (from the past 240 hour(s))  Resp Panel by RT-PCR (Flu A&B, Covid) Nasopharyngeal Swab     Status: None   Collection Time: 05/14/21  8:02 AM   Specimen: Nasopharyngeal Swab; Nasopharyngeal(NP) swabs in vial transport medium  Result Value Ref Range Status   SARS Coronavirus 2 by RT PCR NEGATIVE NEGATIVE Final    Comment: (NOTE) SARS-CoV-2  target nucleic acids are NOT DETECTED.  The SARS-CoV-2 RNA is generally detectable in upper respiratory specimens during the acute phase of infection. The lowest concentration of SARS-CoV-2 viral copies this assay can detect is 138 copies/mL. A negative result does not preclude SARS-Cov-2 infection and should not be used as the sole basis for treatment or other patient management decisions. A negative result may occur with  improper specimen collection/handling, submission of specimen other than nasopharyngeal swab, presence of viral mutation(s) within the areas targeted by this assay, and inadequate number of viral copies(<138 copies/mL). A negative result must be combined with clinical observations, patient history, and epidemiological information. The expected result is Negative.  Fact Sheet for Patients:  EntrepreneurPulse.com.au  Fact Sheet for Healthcare Providers:  IncredibleEmployment.be  This test is no t yet approved or cleared by the [place] FDA and  has been authorized for detection and/or diagnosis of SARS-CoV-2 by FDA under an Emergency Use Authorization (EUA). This EUA will remain  in effect (meaning this test can be used) for the duration of the COVID-19 declaration under Section 564(b)(1) of the Act, 21 U.S.C.section 360bbb-3(b)(1), unless the authorization is terminated  or revoked sooner.       Influenza A by PCR NEGATIVE NEGATIVE Final   Influenza B by PCR NEGATIVE NEGATIVE Final    Comment: (NOTE) The Xpert Xpress SARS-CoV-2/FLU/RSV plus assay is intended as an aid  in the diagnosis of influenza from Nasopharyngeal swab specimens and should not be used as a sole basis for treatment. Nasal washings and aspirates are unacceptable for Xpert Xpress SARS-CoV-2/FLU/RSV testing.  Fact Sheet for Patients: EntrepreneurPulse.com.au  Fact Sheet for Healthcare  Providers: IncredibleEmployment.be  This test is not yet approved or cleared by the [place] FDA and has been authorized for detection and/or diagnosis of SARS-CoV-2 by FDA under an Emergency Use Authorization (EUA). This EUA will remain in effect (meaning this test can be used) for the duration of the COVID-19 declaration under Section 564(b)(1) of the Act, 21 U.S.C. section 360bbb-3(b)(1), unless the authorization is terminated or revoked.  Performed at [facility], [address] [place], [place] [address]   MRSA Next Gen by PCR, Nasal     Status: None   Collection Time: 05/14/21 11:43 AM   Specimen: Nasal Mucosa; Nasal Swab  Result Value Ref Range Status   MRSA by PCR Next Gen NOT DETECTED NOT DETECTED Final    Comment: (NOTE) The GeneXpert MRSA Assay (FDA approved for NASAL specimens only), is one component of a comprehensive MRSA colonization surveillance program. It is not intended to diagnose MRSA infection nor to guide or monitor treatment for MRSA infections. Test performance is not FDA approved in patients less than 75 years old. Performed at [facility], [address], [place], [place] [address]   Resp Panel by RT-PCR (Flu A&B, Covid) Nasopharyngeal Swab     Status: None   Collection Time: 05/17/21  9:54 PM   Specimen: Nasopharyngeal Swab; Nasopharyngeal(NP) swabs in vial transport medium  Result Value Ref Range Status   SARS Coronavirus 2 by RT PCR NEGATIVE NEGATIVE Final    Comment: (NOTE) SARS-CoV-2 target nucleic acids are NOT DETECTED.  The SARS-CoV-2 RNA is generally detectable in upper respiratory specimens during the acute phase of infection. The lowest concentration of SARS-CoV-2 viral copies this assay can detect is 138 copies/mL. A negative result does not preclude SARS-Cov-2 infection and should not be used as the sole basis for treatment or other patient management decisions. A negative result may occur with  improper specimen  collection/handling, submission of specimen other than nasopharyngeal swab, presence of viral mutation(s) within the areas targeted by this assay, and inadequate number of viral copies(<138 copies/mL). A negative result must be combined with clinical observations, patient history, and epidemiological information. The expected result is Negative.  Fact Sheet for Patients:  EntrepreneurPulse.com.au  Fact Sheet for Healthcare Providers:  IncredibleEmployment.be  This test is no t yet approved or cleared by the [place] FDA and  has been authorized for detection and/or diagnosis of SARS-CoV-2 by FDA under an Emergency Use Authorization (EUA). This EUA will remain  in effect (meaning this test can be used) for the duration of the COVID-19 declaration under Section 564(b)(1) of the Act, 21 U.S.C.section 360bbb-3(b)(1), unless the authorization is terminated  or revoked sooner.       Influenza A by PCR NEGATIVE NEGATIVE Final   Influenza B by PCR NEGATIVE NEGATIVE Final    Comment: (NOTE) The Xpert Xpress SARS-CoV-2/FLU/RSV plus assay is intended as an aid in the diagnosis of influenza from Nasopharyngeal swab specimens and should not be used as a sole basis for treatment. Nasal washings and aspirates are unacceptable for Xpert Xpress SARS-CoV-2/FLU/RSV testing.  Fact Sheet for Patients: EntrepreneurPulse.com.au  Fact Sheet for Healthcare Providers: IncredibleEmployment.be  This test is not yet approved or cleared by the [place] and has been authorized for  detection and/or diagnosis of SARS-CoV-2 by FDA under an Emergency Use Authorization (EUA). This EUA will remain in effect (meaning this test can be used) for the duration of the COVID-19 declaration under Section 564(b)(1) of the Act, 21 U.S.C. section 360bbb-3(b)(1), unless the authorization is terminated or revoked.  Performed at [facility], [address], [place], [place] [address]      Labs:   CBC: Recent Labs  Lab 05/14/21 [phone] 05/15/21 0322 05/16/21 0413 05/17/21 2006  WBC 13.9* 9.1 8.3 7.5  NEUTROABS  --   --   --  3.9  HGB 11.4* 10.8* 11.5* 9.8*  HCT 36.2* 34.2* 35.8* 30.9*  MCV 92.1 92.2 92.7 92.8  PLT 287 272 287 0000000   Basic Metabolic Panel: Recent Labs  Lab 05/14/21 0814 05/15/21 0322 05/16/21 0413 05/17/21 2006  NA 127* 132* 135 136  K 4.4 4.3 3.9 4.4  CL 90* 91* 94* 96*  CO2 '23 28 29 28  '$ GLUCOSE 342* 216* 89 138*  BUN 99* 69* 52* 61*  CREATININE 9.25* 7.19* 6.39* 8.07*  CALCIUM 8.6* 8.5* 8.6* 8.6*   Liver Function Tests: Recent Labs  Lab 05/16/21 0413 05/17/21 2006  AST 12* 12*  ALT 14 16  ALKPHOS 49 48  BILITOT 0.5 0.4  PROT 7.2 6.9  ALBUMIN 3.4* 3.5   BNP (last 3 results) Recent Labs    04/20/21 1216 05/15/21 0322 05/17/21 2006  BNP 208.0* 1,176.0* 199.0*   Cardiac Enzymes: No results for input(s): CKTOTAL, CKMB, CKMBINDEX, TROPONINI in the last 168 hours. CBG: Recent Labs  Lab 05/15/21 1152 05/15/21 1649 05/15/21 2135 05/18/21 0058 05/18/21 0759  GLUCAP 173* 99 219* 218* 151*   Hgb A1c No results for input(s): HGBA1C in the last 72 hours. Lipid Profile No results for input(s): CHOL, HDL, LDLCALC, TRIG, CHOLHDL, LDLDIRECT in the last 72 hours. Thyroid function studies No results for input(s): TSH, T4TOTAL, T3FREE, THYROIDAB in the last 72 hours.  Invalid input(s): FREET3 Anemia work up No results for input(s): VITAMINB12, FOLATE, FERRITIN, TIBC, IRON, RETICCTPCT in the last 72 hours. Urinalysis    Component Value Date/Time   COLORURINE STRAW (A) 04/20/2021 1740   APPEARANCEUR CLEAR 04/20/2021 1740   LABSPEC 1.011 04/20/2021 1740   PHURINE 8.0 04/20/2021 1740   GLUCOSEU 150 (A) 04/20/2021 1740   HGBUR NEGATIVE 04/20/2021 1740   BILIRUBINUR NEGATIVE 04/20/2021 1740   KETONESUR NEGATIVE 04/20/2021 1740   PROTEINUR 100 (A) 04/20/2021 1740    UROBILINOGEN 0.2 06/11/2012 0518   NITRITE NEGATIVE 04/20/2021 1740   LEUKOCYTESUR NEGATIVE 04/20/2021 1740         Time coordinating discharge: Over 45 minutes  SIGNED: Deatra James, MD, FACP, FHM. [facility],  Please use [email or web address] to Page If 7PM-7AM, please contact night-coverage [email or web address] Ota [facility] 05/18/2021, 1:49 PM

## 2021-05-18 NOTE — Consult Note (Signed)
Cardiology Consultation:   Patient ID: Kelly Key MRN: [id number]; DOB: 07-19-1971  Admit date: 05/17/2021 Date of Consult: 05/18/2021  PCP:  [name], MD   [place] [facility] Cardiologist:  Carlyle Dolly, MD        Patient Profile:   Kelly Key is a 50 y.o. male with a PMH of chronic diastolic CHF, HTN, HLD, DM type 2, COPD, and ESRD on HD, who is being seen 05/18/2021 for the evaluation of abnormal EKG at the request of Dr. Horris Latino.  History of Present Illness:   Kelly Key was in his usual state of health around 05/12/21 when he began experiencing SOB. He was sent from dialysis 05/14/21 to [facility] after he was found to be hypoxic. He underwent emergent HD with significant improvement in symptoms. Ultimately discharged home 05/16/21. Unfortunately he had worsening SOB again prompting him to return to [facility] 05/17/21. He also had complaints of abdominal pain on admission. EKG showed new TWI compared to previous admission prompting transfer to [facility] for further evaluation.   He was last evaluated by cardiology at an outpatient visit with Levell July, NP 03/2020 at which time he had no anginal complaints though did describe symptoms c/f OSA. No medication changes occurred and he was referred to pulmonology for a sleep study. His last echocardiogram prior to this admission was in 2020 which showed EF 50-55%, mild LVH, indeterminate LV diastolic function, no significant valvular abnormalities, and mildly dilated aortic root (72m). It does not appear that he has undergone ischemic testing though is noted to have coronary artery calcifications on CT scans.    Hospital course: hypertensive, otherwise VSS. Labs notable for electrolytes wnl, Cr 8.66, Hgb 9.8, PLT 265, HsTrop 12>100>98 (peaked at 92 05/14/21), BNP 199. CXR showed vascular congestion. EKG showed sinus rhythm, rate 88, TWI in inferolateral leads, new compared to 05/14/21. Home medications were continued. Cardiology asked to evaluate.     Past Medical History:  Diagnosis Date   Acute diastolic CHF (congestive heart failure) ([facility] 06/11/2012   EF 50-55% ([facility]   Chronic kidney disease    COPD (chronic obstructive pulmonary disease) (HCC)    Diabetes mellitus    A1c 11.5 (06/11/2012).   Dyspnea    Gout    Hepatic steatosis 06/11/2012   Elevated LFTs   Hyperlipemia    Malignant hypertension    Microcytic anemia 06/12/2012   Obesity     Past Surgical History:  Procedure Laterality Date   AV FISTULA PLACEMENT Left 11/11/2019   Procedure: ARTERIOVENOUS (AV) FISTULA CREATION LEFT BRACHIOCEPHALIC ARM;  Surgeon: BSerafina Mitchell MD;  Location: [facility] OR;  Service: Vascular;  Laterality: Left;   IR FLUORO GUIDE CV LINE RIGHT  07/08/2019   IR REMOVAL TUN CV CATH W/O FL  03/23/2020   IR [place] VASC ACCESS RIGHT  07/08/2019   NO PAST SURGERIES     VASCULAR SURGERY       Home Medications:  Prior to Admission medications   Medication Sig Start Date End Date Taking? Authorizing Provider  acetaminophen (TYLENOL) 500 MG tablet Take 1,000 mg by mouth every 6 (six) hours as needed for moderate pain or headache.   Yes [provider]  albuterol (VENTOLIN HFA) 108 (90 Base) MCG/ACT inhaler Inhale 2 puffs into the lungs every 4 (four) hours as needed for wheezing or shortness of breath.  03/24/19  Yes [provider]  allopurinol (ZYLOPRIM) 100 MG tablet Take 2 tablets (200 mg total) by mouth daily. 08/04/19  Yes Love, Ivan Anchors, PA-C  amLODipine (NORVASC) 10 MG tablet Take 0.5 tablets (5 mg total) by mouth daily. 05/16/21 06/15/21 Yes Shahmehdi, Valeria Batman, MD  aspirin EC 81 MG EC tablet Take 1 tablet (81 mg total) by mouth daily. 08/04/19  Yes Love, Ivan Anchors, PA-C  atorvastatin (LIPITOR) 80 MG tablet Take 1 tablet (80 mg total) by mouth daily. 12/05/19  Yes Branch, Alphonse Guild, MD  Calcium Carbonate Antacid (CALCIUM CARBONATE, DOSED IN MG ELEMENTAL CALCIUM,) 1250 MG/5ML SUSP Take 5 mLs (500 mg of elemental calcium  total) by mouth every 6 (six) hours as needed for indigestion. 07/16/19  Yes Amin, Jeanella Flattery, MD  carvedilol (COREG) 25 MG tablet Take 1 tablet (25 mg total) by mouth 2 (two) times daily with a meal. 08/03/19  Yes Love, Ivan Anchors, PA-C  cloNIDine (CATAPRES) 0.3 MG tablet Take 1 tablet (0.3 mg total) by mouth 3 (three) times daily. 08/03/19 05/17/21 Yes Love, Ivan Anchors, PA-C  ezetimibe (ZETIA) 10 MG tablet Take 10 mg by mouth every evening. 01/20/19  Yes [provider]  fluticasone (FLONASE) 50 MCG/ACT nasal spray Place 1 spray into both nostrils daily. Patient taking differently: Place 1 spray into both nostrils daily as needed for allergies. 04/04/18 05/17/21 Yes Shah, Pratik D, DO  furosemide (LASIX) 80 MG tablet Take 1 tablet (80 mg total) by mouth daily. 05/16/21 06/15/21 Yes Shahmehdi, Valeria Batman, MD  gabapentin (NEURONTIN) 100 MG capsule Take 1 capsule (100 mg total) by mouth 3 (three) times daily. 08/03/19  Yes Love, Ivan Anchors, PA-C  insulin detemir (LEVEMIR FLEXTOUCH) 100 UNIT/ML FlexPen Inject 50 Units into the skin at bedtime. 03/11/21  Yes Cassandria Anger, MD  linaclotide Rolan Lipa) 145 MCG CAPS capsule Take 1 capsule (145 mcg total) by mouth daily before breakfast. 04/05/18  Yes Manuella Ghazi, Pratik D, DO  multivitamin (RENA-VIT) TABS tablet Take 1 tablet by mouth at bedtime. 08/03/19  Yes Love, Ivan Anchors, PA-C  Na Sulfate-K Sulfate-Mg Sulf (SUPREP BOWEL PREP KIT) 17.5-3.13-1.6 GM/177ML SOLN Take 1 kit by mouth as directed. 04/11/21  Yes Wohl, Darren, MD  NOVOLOG FLEXPEN 100 UNIT/ML FlexPen INJECT 8 TO 14 UNITS SUBCUTANEOUSLY THREE TIMES DAILY WITH MEALS Patient taking differently: Inject 8 to 14 subcutaneously 3 times a day after meals sliding scale 03/11/21  Yes Nida, Marella Chimes, MD  oxyCODONE-acetaminophen (PERCOCET) 5-325 MG tablet Take 1-2 tablets by mouth every 6 (six) hours as needed. 04/24/21  Yes Delo, Nathaneil Canary, MD  OZEMPIC, 0.25 OR 0.5 MG/DOSE, 2 MG/1.5ML SOPN Inject 0.25 mg into the skin  once a week. 11/23/20  Yes [provider]  predniSONE (DELTASONE) 10 MG tablet Take 2 tablets (20 mg total) by mouth 2 (two) times daily. 04/24/21  Yes Delo, Nathaneil Canary, MD  sevelamer carbonate (RENVELA) 800 MG tablet Take 3 tablets (2,400 mg total) by mouth 3 (three) times daily with meals. 08/03/19  Yes Love, Ivan Anchors, PA-C  SSD 1 % cream Apply topically. 05/13/21  Yes [provider]  valACYclovir (VALTREX) 1000 MG tablet Take 1 tablet (1,000 mg total) by mouth 3 (three) times daily. 04/24/21  Yes Delo, Nathaneil Canary, MD  amoxicillin (AMOXIL) 500 MG capsule Take 500 mg by mouth 2 (two) times daily. Patient not taking: Reported on 05/17/2021 05/13/21   [provider]  diclofenac sodium (VOLTAREN) 1 % GEL Apply 2 g topically 4 (four) times daily. Patient taking differently: Apply 2 g topically 4 (four) times daily as needed (pain). 08/03/19   Bary Leriche, PA-C  glucose blood (ACCU-CHEK AVIVA PLUS) test strip Use as instructed TO CHECK BLOOD GLUCOSE THREE TIMES DAILY 08/15/20   Nida, Marella Chimes, MD  HYDROcodone-acetaminophen (NORCO/VICODIN) 5-325 MG tablet Take 1 tablet by mouth every 6 (six) hours as needed for severe pain. Patient not taking: No sig reported 11/11/19   Dagoberto Ligas, PA-C  Insulin Pen Needle (B-D ULTRAFINE III SHORT PEN) 31G X 8 MM MISC 1 each by Does not apply route as directed. 08/13/20   Cassandria Anger, MD    Inpatient Medications: Scheduled Meds:  alum & mag hydroxide-simeth  30 mL Oral Once   amLODipine  5 mg Oral Daily   aspirin EC  81 mg Oral Daily   atorvastatin  80 mg Oral Daily   carvedilol  25 mg Oral BID WC   Chlorhexidine Gluconate Cloth  6 each Topical Q0600   cloNIDine  0.3 mg Oral TID   ezetimibe  10 mg Oral QPM   furosemide  40 mg Intravenous Q12H   gabapentin  100 mg Oral TID   heparin  5,000 Units Subcutaneous Q8H   insulin aspart  0-15 Units Subcutaneous TID WC   insulin aspart  0-5 Units Subcutaneous QHS   insulin  detemir  50 Units Subcutaneous QHS   multivitamin  1 tablet Oral QHS   sevelamer carbonate  2,400 mg Oral TID WC   Continuous Infusions:  PRN Meds: acetaminophen, albuterol, ondansetron (ZOFRAN) IV, oxyCODONE-acetaminophen  Allergies:    Allergies  Allergen Reactions   Lisinopril Swelling   Hydralazine Anxiety    Hallucinations     Social History:   Social History   Socioeconomic History   Marital status: Single    Spouse name: Not on file   Number of children: Not on file   Years of education: Not on file   Highest education level: Not on file  Occupational History   Occupation: retired  Tobacco Use   Smoking status: Never   Smokeless tobacco: Never  Vaping Use   Vaping Use: Never used  Substance and Sexual Activity   Alcohol use: Not Currently    Comment: haven't drank in over 5 years    Drug use: No   Sexual activity: Not Currently  Other Topics Concern   Not on file  Social History Narrative   Not on file   Social Determinants of Health   Financial Resource Strain: Not on file  Food Insecurity: Not on file  Transportation Needs: Not on file  Physical Activity: Not on file  Stress: Not on file  Social Connections: Not on file  Intimate Partner Violence: Not on file    Family History:    Family History  Problem Relation Age of Onset   Gout Mother    Asthma Mother    Diabetes Father    Heart failure Father    Diabetes Sister    Hypertension Brother    Pancreatic cancer Brother    Diabetes Sister      ROS:  Please see the history of present illness.   All other ROS reviewed and negative.     Physical Exam/Data:   Vitals:   05/18/21 1330 05/18/21 1400 05/18/21 1440 05/18/21 1603  BP: (!) 154/97 (!) 176/94  (!) 162/86  Pulse: 75 76  83  Resp:    18  Temp:   98.1 F (36.7 C) 97.7 F (36.5 C)  TempSrc:   (P) Oral Oral  SpO2:      Weight:  Height:        Intake/Output Summary (Last 24 hours) at 05/18/2021 1631 Last data filed at  05/18/2021 0800 Gross per 24 hour  Intake 240 ml  Output 925 ml  Net -685 ml   Last 3 Weights 05/18/2021 05/18/2021 05/17/2021  Weight (lbs) 300 lb 7.8 oz 302 lb 3.2 oz 301 lb 8 oz  Weight (kg) 136.3 kg 137.077 kg 136.76 kg     Body mass index is 40.75 kg/m.  General:  Well nourished, well developed, in no acute distress HEENT: normal Lymph: no adenopathy Neck: no JVD Endocrine:  No thryomegaly Vascular: No carotid bruits; FA pulses 2+ bilaterally without bruits  Cardiac:  normal S1, S2; RRR; no murmur  Lungs:  clear to auscultation bilaterally, no wheezing, rhonchi or rales  Abd: soft, nontender, no hepatomegaly  Ext: no edema Musculoskeletal:  No deformities, BUE and BLE strength normal and equal Skin: warm and dry  Neuro:  CNs 2-12 intact, no focal abnormalities noted Psych:  Normal affect   EKG:  The EKG was personally reviewed and demonstrates: Sinus rhythm with diffuse T wave inversions Telemetry:  Telemetry was personally reviewed and demonstrates: Sinus rhythm  Relevant CV Studies: TTE 05/15/21  1. Left ventricular ejection fraction, by estimation, is 55 to 60%. The  left ventricle has normal function. The left ventricle demonstrates  regional wall motion abnormalities (see scoring diagram/findings for  description). There is moderate left  ventricular hypertrophy. Left ventricular diastolic parameters were  normal.   2. Right ventricular systolic function is normal. The right ventricular  size is normal. Tricuspid regurgitation signal is inadequate for assessing  PA pressure.   3. There is a trivial pericardial effusion posterior to the left  ventricle.   4. The mitral valve is grossly normal. Trivial mitral valve  regurgitation.   5. The aortic valve is tricuspid. Aortic valve regurgitation is not  visualized.   6. Aortic dilatation noted. There is moderate dilatation of the aortic  root, measuring 44 mm.   7. The inferior vena cava is normal in size with greater  than 50%  respiratory variability, suggesting right atrial pressure of 3 mmHg.  Laboratory Data:  High Sensitivity Troponin:   Recent Labs  Lab 05/14/21 0814 05/14/21 1006 05/17/21 2006 05/17/21 2203 05/18/21 1147  TROPONINIHS 24* 92* 185* 631* DUPLICATE REQUEST  98*     Chemistry Recent Labs  Lab 05/16/21 0413 05/17/21 2006 05/18/21 1147  NA 135 136 134*  K 3.9 4.4 4.1  CL 94* 96* 96*  CO2 29 28 25   GLUCOSE 89 138* 125*  BUN 52* 61* 65*  CREATININE 6.39* 8.07* 8.66*  CALCIUM 8.6* 8.6* 8.4*  GFRNONAA 10* 7* 7*  ANIONGAP 12 12 13     Recent Labs  Lab 05/16/21 0413 05/17/21 2006 05/18/21 1147  PROT 7.2 6.9 6.3*  ALBUMIN 3.4* 3.5 3.1*  AST 12* 12* 13*  ALT 14 16 16   ALKPHOS 49 48 45  BILITOT 0.5 0.4 0.6   Hematology Recent Labs  Lab 05/16/21 0413 05/17/21 2006 05/18/21 1147  WBC 8.3 7.5 5.9  RBC 3.86* 3.33* 3.35*  HGB 11.5* 9.8* 9.8*  HCT 35.8* 30.9* 30.9*  MCV 92.7 92.8 92.2  MCH 29.8 29.4 29.3  MCHC 32.1 31.7 31.7  RDW 16.4* 16.7* 16.7*  PLT 287 246 265   BNP Recent Labs  Lab 05/15/21 0322 05/17/21 2006  BNP 1,176.0* 199.0*    DDimer No results for input(s): DDIMER in the last 168 hours.  Radiology/Studies:  DG Chest 2 View  Result Date: 05/17/2021 CLINICAL DATA:  Shortness of breath.  Progressive.  History of COPD. EXAM: CHEST - 2 VIEW COMPARISON:  Radiograph 05/15/2021, 2 days ago. FINDINGS: Mild cardiomegaly stable. Unchanged mediastinal contours. Peribronchial thickening with increase from prior exam. Background vascular congestion. No septal thickening. No confluent airspace disease. Stable osseous structures with mild chronic wedging of lower thoracic vertebra IMPRESSION: Similar mild cardiomegaly. Vascular congestion. Peribronchial thickening with increase from prior exam, may be pulmonary edema or bronchitis. Electronically Signed   By: Keith Rake M.D.   On: 05/17/2021 20:10   DG Chest 2 View  Result Date: 05/15/2021 CLINICAL  DATA:  Dyspnea EXAM: CHEST - 2 VIEW COMPARISON:  05/14/2021, 07/03/2019 FINDINGS: Borderline cardiomegaly with vascular congestion. No pleural effusion, consolidation, or pneumothorax. Mild chronic wedging of lower thoracic vertebra. IMPRESSION: Borderline cardiomegaly with vascular congestion. Overall appearance is improved as compared with 05/14/2021. Electronically Signed   By: Donavan Foil M.D.   On: 05/15/2021 20:05   ECHOCARDIOGRAM COMPLETE  Result Date: 05/15/2021    ECHOCARDIOGRAM REPORT   Patient Name:   ALFONSE GARRINGER Date of Exam: 05/15/2021 Medical Rec #:  [id number]    Height:       72.0 in Accession #:    [id number]   Weight:       300.3 lb Date of Birth:  22-Dec-1970     BSA:          2.532 m Patient Age:    56 years     BP:           113/62 mmHg Patient Gender: M            HR:           81 bpm. Exam Location:  [facility] Procedure: 2D Echo, Cardiac Doppler and Color Doppler Indications:    Dyspnea R06.00  History:        Patient has prior history of Echocardiogram examinations, most                 recent 06/23/2019. CHF, COPD; Risk Factors:Hypertension, Diabetes                 and Dyslipidemia. Morbid Obesity, Acute renal failure                 superimposed on stage 4 chronic kidney disease ([place]), ESRD on                 dialysis ([place]).  Sonographer:    Alvino Chapel RCS Referring Phys: [phone] COURAGE EMOKPAE IMPRESSIONS  1. Left ventricular ejection fraction, by estimation, is 55 to 60%. The left ventricle has normal function. The left ventricle demonstrates regional wall motion abnormalities (see scoring diagram/findings for description). There is moderate left ventricular hypertrophy. Left ventricular diastolic parameters were normal.  2. Right ventricular systolic function is normal. The right ventricular size is normal. Tricuspid regurgitation signal is inadequate for assessing PA pressure.  3. There is a trivial pericardial effusion posterior to the left ventricle.  4. The mitral valve is  grossly normal. Trivial mitral valve regurgitation.  5. The aortic valve is tricuspid. Aortic valve regurgitation is not visualized.  6. Aortic dilatation noted. There is moderate dilatation of the aortic root, measuring 44 mm.  7. The inferior vena cava is normal in size with greater than 50% respiratory variability, suggesting right atrial pressure of 3 mmHg. FINDINGS  Left Ventricle: Left ventricular ejection fraction, by estimation, is  55 to 60%. The left ventricle has normal function. The left ventricle demonstrates regional wall motion abnormalities. The left ventricular internal cavity size was normal in size. There is moderate left ventricular hypertrophy. Left ventricular diastolic parameters were normal.  LV Wall Scoring: The apical septal segment, apical inferior segment, and apex are hypokinetic. The entire anterior wall, entire lateral wall, anterior septum, inferior wall, mid inferoseptal segment, and basal inferoseptal segment are normal. Right Ventricle: The right ventricular size is normal. No increase in right ventricular wall thickness. Right ventricular systolic function is normal. Tricuspid regurgitation signal is inadequate for assessing PA pressure. Left Atrium: Left atrial size was normal in size. Right Atrium: Right atrial size was normal in size. Pericardium: Trivial pericardial effusion is present. The pericardial effusion is posterior to the left ventricle. Mitral Valve: The mitral valve is grossly normal. Mild to moderate mitral annular calcification. Trivial mitral valve regurgitation. Tricuspid Valve: The tricuspid valve is grossly normal. Tricuspid valve regurgitation is trivial. Aortic Valve: The aortic valve is tricuspid. There is mild aortic valve annular calcification. Aortic valve regurgitation is not visualized. Pulmonic Valve: The pulmonic valve was grossly normal. Pulmonic valve regurgitation is trivial. Aorta: Aortic dilatation noted. There is moderate dilatation of the  aortic root, measuring 44 mm. Venous: The inferior vena cava is normal in size with greater than 50% respiratory variability, suggesting right atrial pressure of 3 mmHg. IAS/Shunts: No atrial level shunt detected by color flow Doppler.  LEFT VENTRICLE PLAX 2D LVIDd:         5.20 cm  Diastology LVIDs:         3.40 cm  LV e' medial:    8.92 cm/s LV PW:         1.60 cm  LV E/e' medial:  11.2 LV IVS:        1.40 cm  LV e' lateral:   9.79 cm/s LVOT diam:     1.90 cm  LV E/e' lateral: 10.2 LV SV:         53 LV SV Index:   21 LVOT Area:     2.84 cm  RIGHT VENTRICLE RV S prime:     11.40 cm/s TAPSE (M-mode): 1.9 cm LEFT ATRIUM             Index       RIGHT ATRIUM           Index LA diam:        4.50 cm 1.78 cm/m  RA Area:     22.00 cm LA Vol (A2C):   84.2 ml 33.25 ml/m RA Volume:   69.00 ml  27.25 ml/m LA Vol (A4C):   75.1 ml 29.66 ml/m LA Biplane Vol: 80.9 ml 31.95 ml/m  AORTIC VALVE LVOT Vmax:   89.30 cm/s LVOT Vmean:  64.700 cm/s LVOT VTI:    0.188 m  AORTA Ao Root diam: 4.40 cm MITRAL VALVE MV Area (PHT): 2.80 cm     SHUNTS MV Decel Time: 271 msec     Systemic VTI:  0.19 m MV E velocity: 100.00 cm/s  Systemic Diam: 1.90 cm MV A velocity: 85.70 cm/s MV E/A ratio:  1.17 Rozann Lesches MD Electronically signed by Rozann Lesches MD Signature Date/Time: 05/15/2021/12:36:37 PM    Final      Assessment and Plan:   Unstable angina: Patient had a shortness of breath.  Patient had no chest pain.  Troponins were flat at around 100 x3.  In review of his ECG, he has significant  T wave inversions that are new from 2 days prior.  I do feel that he needs a left heart catheterization.  I have advised him to stay in the hospital for left heart catheterization on Monday.  The patient is adamant that he wishes to go home.  I told him multiple times that this is against my advice.  If he does go home, we [facility] try to arrange outpatient left heart catheterization this week. The patient understands that risks include but are not  limited to stroke (1 in 1000), death (1 in 1), kidney failure [usually temporary] (1 in 500), bleeding (1 in 200), allergic reaction [possibly serious] (1 in 200), and agrees to proceed.  End-stage renal disease: Dialysis per nephrology Hypertension: Significantly elevated.  Arlethia Basso need aggressive antihypertensives as an outpatient.  No changes at this time.   Risk Assessment/Risk Scores:     TIMI Risk Score for Unstable Angina or Non-ST Elevation MI:   The patient's TIMI risk score is  , which indicates a  % risk of all cause mortality, new or recurrent myocardial infarction or need for urgent revascularization in the next 14 days.          For questions or updates, please contact [place] Please consult [email or web address] for contact info under    Signed, Abigail Butts, PA-C  05/18/2021 4:31 PM

## 2021-05-18 NOTE — Discharge Summary (Signed)
Discharge Summary  Kelly Key [id number] DOB: 04-Dec-1970  PCP: [name], MD  Admit date: 05/17/2021 Discharge date: 05/18/2021  Time spent: 30 mins  Recommendations for Outpatient Follow-up:  Signed out [facility]  Discharge Diagnoses:  Active Hospital Problems   Diagnosis Date Noted   NSTEMI (non-ST elevated myocardial infarction) ([place]) 05/17/2021   ESRD (end stage renal disease) ([place])    Uncontrolled type 2 diabetes mellitus with hyperglycemia (HCC)    Dyslipidemia 07/03/2019   Chronic combined systolic and diastolic CHF (congestive heart failure) ([place]) 07/03/2019   Essential hypertension, benign 06/11/2012    Resolved Hospital Problems  No resolved problems to display.    Discharge Condition: Fair  Diet recommendation: Signed out AMA  Vitals:   05/18/21 1440 05/18/21 1603  BP: (!) 173/89 (!) 162/86  Pulse: 77 83  Resp: 18 18  Temp: 98.1 F (36.7 C) 97.7 F (36.5 C)  SpO2: 97%     History of present illness:  Kelly Key  is a 50 y.o. male, with history of ESRD, CHF, COPD, DM type 2, HLD, HTN, morbid obesity, presents the ED with a chief complaint of dyspnea.  Of note patient was recently discharged on July 21, after being admitted for SOB. Patient reports that around 4 PM on July 22 he became acutely short of breath.  He was outside with minimal exertion when the symptoms started.  He reports that dyspnea was worse with exertion, better with rest.  He does admit to orthopnea.  He denies wheeze or chest pain. He had palpitations. Patient does not smoke, drink alcohol.  He is vaccinated for COVID.  In the ED, vital signs fairly stable except for BP around 162/99, troponin elevated but with a flat trend 112--> 100, chest x-ray showed vascular congestion, EKG shows a heart rate of 88, sinus rhythm, QTC 497 with diffuse T wave inversions. Cardiology was consulted from the ED and advised heparin if the troponin increased, however the troponin was flat. Cardiology advised  transfer to [facility] from [facility] due to no coverage over the weekend.  Patient admitted for further management.   Today, saw patient before HD, was complaining of shortness of breath but denied any chest pain.  Discussed with patient after HD concerning EKG changes, cardiology consulted, recommended cardiac catheterization.  After multiple and extensive discussion with patient, he adamantly refused to stay in the hospital for procedure.  Signed AMA.  Stated he will come back next week as an outpatient.   Hospital Course:  Active Problems:   Essential hypertension, benign   Dyslipidemia   Chronic combined systolic and diastolic CHF (congestive heart failure) (HCC)   ESRD (end stage renal disease) ([place])   Uncontrolled type 2 diabetes mellitus with hyperglycemia (HCC)   NSTEMI (non-ST elevated myocardial infarction) ([place])   Unstable angina Currently chest pain-free Trop elevated 112-->98 but flat trend EKG with diffuse T wave inversion, which is new compared to previous Echo with EF of 55 to 60%, with regional wall motion abnormality Discussed with cardiology, recommended cardiac cath, patient wants to be discharged, signed AMA Continue aspirin, Coreg, statin Follow-up with cardiology as an outpatient   Chronic diastolic HF Echo as above Fluid management by HD as well as Lasix   ESRD HD days are T/TH/S, last HD on 05/18/2021 Outpatient HD   Hypertension Continue amlodipine, clonidine, Coreg   Diabetes mellitus type 2 Last A1c 8.2, uncontrolled Continue home regimen      Estimated body mass index is 38.03 kg/m  as calculated from the following:   Height as of this encounter: 6' (1.829 m).   Weight as of this encounter: 127.2 kg.    Procedures: None  Consultations: Nephrology Cardiology  Discharge Exam: BP (!) 162/86 (BP Location: Right Arm)   Pulse 83   Temp 97.7 F (36.5 C) (Oral)   Resp 18   Ht 6' (1.829 m)   Wt 127.2 kg   SpO2 97%   BMI 38.03 kg/m    General: NAD  Cardiovascular: S1, S2 present Respiratory: Diminished breath sounds bilaterally Abdomen: Soft, nontender, nondistended, bowel sounds present Musculoskeletal: +1 bilateral pedal edema noted Skin: Normal Psychiatry: Normal mood  Discharge Instructions You were cared for by a hospitalist during your hospital stay. If you have any questions about your discharge medications or the care you received while you were in the hospital after you are discharged, you can call the unit and asked to speak with the hospitalist on call if the hospitalist that took care of you is not available. Once you are discharged, your primary care physician will handle any further medical issues. Please note that NO REFILLS for any discharge medications will be authorized once you are discharged, as it is imperative that you return to your primary care physician (or establish a relationship with a primary care physician if you do not have one) for your aftercare needs so that they can reassess your need for medications and monitor your lab values.    Allergies  Allergen Reactions   Lisinopril Swelling   Hydralazine Anxiety    Hallucinations       The results of significant diagnostics from this hospitalization (including imaging, microbiology, ancillary and laboratory) are listed below for reference.    Significant Diagnostic Studies: CT ABDOMEN PELVIS WO CONTRAST  Result Date: 04/20/2021 CLINICAL DATA:  Abdominal pain and vomiting for 2 days, unable to complete dialysis EXAM: CT ABDOMEN AND PELVIS WITHOUT CONTRAST TECHNIQUE: Multidetector CT imaging of the abdomen and pelvis was performed following the standard protocol without IV contrast. COMPARISON:  12/15/2020 FINDINGS: Lower chest: No acute abnormality. Coronary artery calcifications. Trace pericardial effusion, unchanged compared to prior examination Hepatobiliary: No solid liver abnormality is seen. No gallstones, gallbladder wall thickening,  or biliary dilatation. Pancreas: Unremarkable. No pancreatic ductal dilatation or surrounding inflammatory changes. Spleen: Normal in size without significant abnormality. Adrenals/Urinary Tract: Adrenal glands are unremarkable. Kidneys are normal, without renal calculi, solid lesion, or hydronephrosis. Mild thickening of the urinary bladder wall. Stomach/Bowel: Stomach is within normal limits. Appendix appears normal. No evidence of bowel wall thickening, distention, or inflammatory changes. Vascular/Lymphatic: Aortic atherosclerosis. No enlarged abdominal or pelvic lymph nodes. Reproductive: No mass or other significant abnormality. Other: Fat containing umbilical hernia.  No abdominopelvic ascites. Musculoskeletal: No acute or significant osseous findings. IMPRESSION: 1. No non-contrast CT findings of the abdomen or pelvis to explain abdominal pain or vomiting. 2. Mild thickening of the urinary bladder wall, suggestive of nonspecific infectious or inflammatory cystitis. Correlate with urinalysis. 3. Coronary artery disease. Aortic Atherosclerosis (ICD10-I70.0). Electronically Signed   By: Eddie Candle M.D.   On: 04/20/2021 14:27   DG Chest 2 View  Result Date: 05/17/2021 CLINICAL DATA:  Shortness of breath.  Progressive.  History of COPD. EXAM: CHEST - 2 VIEW COMPARISON:  Radiograph 05/15/2021, 2 days ago. FINDINGS: Mild cardiomegaly stable. Unchanged mediastinal contours. Peribronchial thickening with increase from prior exam. Background vascular congestion. No septal thickening. No confluent airspace disease. Stable osseous structures with mild chronic wedging of lower thoracic  vertebra IMPRESSION: Similar mild cardiomegaly. Vascular congestion. Peribronchial thickening with increase from prior exam, may be pulmonary edema or bronchitis. Electronically Signed   By: Keith Rake M.D.   On: 05/17/2021 20:10   DG Chest 2 View  Result Date: 05/15/2021 CLINICAL DATA:  Dyspnea EXAM: CHEST - 2 VIEW  COMPARISON:  05/14/2021, 07/03/2019 FINDINGS: Borderline cardiomegaly with vascular congestion. No pleural effusion, consolidation, or pneumothorax. Mild chronic wedging of lower thoracic vertebra. IMPRESSION: Borderline cardiomegaly with vascular congestion. Overall appearance is improved as compared with 05/14/2021. Electronically Signed   By: Donavan Foil M.D.   On: 05/15/2021 20:05   DG Chest Port 1 View  Result Date: 05/14/2021 CLINICAL DATA:  Shortness of breath. EXAM: PORTABLE CHEST 1 VIEW COMPARISON:  04/20/2021. FINDINGS: Cardiomegaly with diffuse bilateral interstitial prominence consistent with interstitial edema. Pneumonitis cannot be excluded. Low lung volumes. No pleural effusion or pneumothorax. IMPRESSION: 1. Cardiomegaly with diffuse bilateral interstitial prominence consistent interstitial edema. Pneumonitis cannot be excluded. 2.  Low lung volumes. Electronically Signed   By: Marcello Moores  Register   On: 05/14/2021 08:34   DG Chest Port 1 View  Result Date: 04/20/2021 CLINICAL DATA:  Bibasilar rales.  History of COPD. EXAM: PORTABLE CHEST 1 VIEW COMPARISON:  07/03/2019 FINDINGS: The cardiac silhouette is borderline enlarged, accentuated by portable AP technique. There is mild peribronchial cuffing. No confluent airspace opacity, overt pulmonary edema, sizable pleural effusion, pneumothorax is identified. No acute osseous abnormality is seen. IMPRESSION: Mild peribronchial cuffing which could reflect bronchitis or vascular congestion. No airspace consolidation or overt pulmonary edema Electronically Signed   By: Logan Bores M.D.   On: 04/20/2021 13:03   ECHOCARDIOGRAM COMPLETE  Result Date: 05/15/2021    ECHOCARDIOGRAM REPORT   Patient Name:   CHANDLOR JERKINS Date of Exam: 05/15/2021 Medical Rec #:  [id number]    Height:       72.0 in Accession #:    [id number]   Weight:       300.3 lb Date of Birth:  1971/02/09     BSA:          2.532 m Patient Age:    83 years     BP:           113/62 mmHg  Patient Gender: M            HR:           81 bpm. Exam Location:  [facility] Procedure: 2D Echo, Cardiac Doppler and Color Doppler Indications:    Dyspnea R06.00  History:        Patient has prior history of Echocardiogram examinations, most                 recent 06/23/2019. CHF, COPD; Risk Factors:Hypertension, Diabetes                 and Dyslipidemia. Morbid Obesity, Acute renal failure                 superimposed on stage 4 chronic kidney disease ([place]), ESRD on                 dialysis ([place]).  Sonographer:    Alvino Chapel RCS Referring Phys: [phone] COURAGE EMOKPAE IMPRESSIONS  1. Left ventricular ejection fraction, by estimation, is 55 to 60%. The left ventricle has normal function. The left ventricle demonstrates regional wall motion abnormalities (see scoring diagram/findings for description). There is moderate left ventricular hypertrophy. Left ventricular diastolic parameters were normal.  2.  Right ventricular systolic function is normal. The right ventricular size is normal. Tricuspid regurgitation signal is inadequate for assessing PA pressure.  3. There is a trivial pericardial effusion posterior to the left ventricle.  4. The mitral valve is grossly normal. Trivial mitral valve regurgitation.  5. The aortic valve is tricuspid. Aortic valve regurgitation is not visualized.  6. Aortic dilatation noted. There is moderate dilatation of the aortic root, measuring 44 mm.  7. The inferior vena cava is normal in size with greater than 50% respiratory variability, suggesting right atrial pressure of 3 mmHg. FINDINGS  Left Ventricle: Left ventricular ejection fraction, by estimation, is 55 to 60%. The left ventricle has normal function. The left ventricle demonstrates regional wall motion abnormalities. The left ventricular internal cavity size was normal in size. There is moderate left ventricular hypertrophy. Left ventricular diastolic parameters were normal.  LV Wall Scoring: The apical septal segment,  apical inferior segment, and apex are hypokinetic. The entire anterior wall, entire lateral wall, anterior septum, inferior wall, mid inferoseptal segment, and basal inferoseptal segment are normal. Right Ventricle: The right ventricular size is normal. No increase in right ventricular wall thickness. Right ventricular systolic function is normal. Tricuspid regurgitation signal is inadequate for assessing PA pressure. Left Atrium: Left atrial size was normal in size. Right Atrium: Right atrial size was normal in size. Pericardium: Trivial pericardial effusion is present. The pericardial effusion is posterior to the left ventricle. Mitral Valve: The mitral valve is grossly normal. Mild to moderate mitral annular calcification. Trivial mitral valve regurgitation. Tricuspid Valve: The tricuspid valve is grossly normal. Tricuspid valve regurgitation is trivial. Aortic Valve: The aortic valve is tricuspid. There is mild aortic valve annular calcification. Aortic valve regurgitation is not visualized. Pulmonic Valve: The pulmonic valve was grossly normal. Pulmonic valve regurgitation is trivial. Aorta: Aortic dilatation noted. There is moderate dilatation of the aortic root, measuring 44 mm. Venous: The inferior vena cava is normal in size with greater than 50% respiratory variability, suggesting right atrial pressure of 3 mmHg. IAS/Shunts: No atrial level shunt detected by color flow Doppler.  LEFT VENTRICLE PLAX 2D LVIDd:         5.20 cm  Diastology LVIDs:         3.40 cm  LV e' medial:    8.92 cm/s LV PW:         1.60 cm  LV E/e' medial:  11.2 LV IVS:        1.40 cm  LV e' lateral:   9.79 cm/s LVOT diam:     1.90 cm  LV E/e' lateral: 10.2 LV SV:         53 LV SV Index:   21 LVOT Area:     2.84 cm  RIGHT VENTRICLE RV S prime:     11.40 cm/s TAPSE (M-mode): 1.9 cm LEFT ATRIUM             Index       RIGHT ATRIUM           Index LA diam:        4.50 cm 1.78 cm/m  RA Area:     22.00 cm LA Vol (A2C):   84.2 ml 33.25  ml/m RA Volume:   69.00 ml  27.25 ml/m LA Vol (A4C):   75.1 ml 29.66 ml/m LA Biplane Vol: 80.9 ml 31.95 ml/m  AORTIC VALVE LVOT Vmax:   89.30 cm/s LVOT Vmean:  64.700 cm/s LVOT VTI:    0.188 m  AORTA Ao Root diam: 4.40 cm MITRAL VALVE MV Area (PHT): 2.80 cm     SHUNTS MV Decel Time: 271 msec     Systemic VTI:  0.19 m MV E velocity: 100.00 cm/s  Systemic Diam: 1.90 cm MV A velocity: 85.70 cm/s MV E/A ratio:  1.17 Rozann Lesches MD Electronically signed by Rozann Lesches MD Signature Date/Time: 05/15/2021/12:36:37 PM    Final     Microbiology: Recent Results (from the past 240 hour(s))  Resp Panel by RT-PCR (Flu A&B, Covid) Nasopharyngeal Swab     Status: None   Collection Time: 05/14/21  8:02 AM   Specimen: Nasopharyngeal Swab; Nasopharyngeal(NP) swabs in vial transport medium  Result Value Ref Range Status   SARS Coronavirus 2 by RT PCR NEGATIVE NEGATIVE Final    Comment: (NOTE) SARS-CoV-2 target nucleic acids are NOT DETECTED.  The SARS-CoV-2 RNA is generally detectable in upper respiratory specimens during the acute phase of infection. The lowest concentration of SARS-CoV-2 viral copies this assay can detect is 138 copies/mL. A negative result does not preclude SARS-Cov-2 infection and should not be used as the sole basis for treatment or other patient management decisions. A negative result may occur with  improper specimen collection/handling, submission of specimen other than nasopharyngeal swab, presence of viral mutation(s) within the areas targeted by this assay, and inadequate number of viral copies(<138 copies/mL). A negative result must be combined with clinical observations, patient history, and epidemiological information. The expected result is Negative.  Fact Sheet for Patients:  EntrepreneurPulse.com.au  Fact Sheet for Healthcare Providers:  IncredibleEmployment.be  This test is no t yet approved or cleared by the [place]  FDA and  has been authorized for detection and/or diagnosis of SARS-CoV-2 by FDA under an Emergency Use Authorization (EUA). This EUA will remain  in effect (meaning this test can be used) for the duration of the COVID-19 declaration under Section 564(b)(1) of the Act, 21 U.S.C.section 360bbb-3(b)(1), unless the authorization is terminated  or revoked sooner.       Influenza A by PCR NEGATIVE NEGATIVE Final   Influenza B by PCR NEGATIVE NEGATIVE Final    Comment: (NOTE) The Xpert Xpress SARS-CoV-2/FLU/RSV plus assay is intended as an aid in the diagnosis of influenza from Nasopharyngeal swab specimens and should not be used as a sole basis for treatment. Nasal washings and aspirates are unacceptable for Xpert Xpress SARS-CoV-2/FLU/RSV testing.  Fact Sheet for Patients: EntrepreneurPulse.com.au  Fact Sheet for Healthcare Providers: IncredibleEmployment.be  This test is not yet approved or cleared by the [place] FDA and has been authorized for detection and/or diagnosis of SARS-CoV-2 by FDA under an Emergency Use Authorization (EUA). This EUA will remain in effect (meaning this test can be used) for the duration of the COVID-19 declaration under Section 564(b)(1) of the Act, 21 U.S.C. section 360bbb-3(b)(1), unless the authorization is terminated or revoked.  Performed at [facility], [address], [place], [place] [address]   MRSA Next Gen by PCR, Nasal     Status: None   Collection Time: 05/14/21 11:43 AM   Specimen: Nasal Mucosa; Nasal Swab  Result Value Ref Range Status   MRSA by PCR Next Gen NOT DETECTED NOT DETECTED Final    Comment: (NOTE) The GeneXpert MRSA Assay (FDA approved for NASAL specimens only), is one component of a comprehensive MRSA colonization surveillance program. It is not intended to diagnose MRSA infection nor to guide or monitor treatment for MRSA infections. Test performance is not FDA approved in  patients less than 28 years old. Performed at [facility], [address], [place], [place] [address]   Resp Panel by RT-PCR (Flu A&B, Covid) Nasopharyngeal Swab     Status: None   Collection Time: 05/17/21  9:54 PM   Specimen: Nasopharyngeal Swab; Nasopharyngeal(NP) swabs in vial transport medium  Result Value Ref Range Status   SARS Coronavirus 2 by RT PCR NEGATIVE NEGATIVE Final    Comment: (NOTE) SARS-CoV-2 target nucleic acids are NOT DETECTED.  The SARS-CoV-2 RNA is generally detectable in upper respiratory specimens during the acute phase of infection. The lowest concentration of SARS-CoV-2 viral copies this assay can detect is 138 copies/mL. A negative result does not preclude SARS-Cov-2 infection and should not be used as the sole basis for treatment or other patient management decisions. A negative result may occur with  improper specimen collection/handling, submission of specimen other than nasopharyngeal swab, presence of viral mutation(s) within the areas targeted by this assay, and inadequate number of viral copies(<138 copies/mL). A negative result must be combined with clinical observations, patient history, and epidemiological information. The expected result is Negative.  Fact Sheet for Patients:  EntrepreneurPulse.com.au  Fact Sheet for Healthcare Providers:  IncredibleEmployment.be  This test is no t yet approved or cleared by the [place] FDA and  has been authorized for detection and/or diagnosis of SARS-CoV-2 by FDA under an Emergency Use Authorization (EUA). This EUA will remain  in effect (meaning this test can be used) for the duration of the COVID-19 declaration under Section 564(b)(1) of the Act, 21 U.S.C.section 360bbb-3(b)(1), unless the authorization is terminated  or revoked sooner.       Influenza A by PCR NEGATIVE NEGATIVE Final   Influenza B by PCR NEGATIVE NEGATIVE Final    Comment: (NOTE) The  Xpert Xpress SARS-CoV-2/FLU/RSV plus assay is intended as an aid in the diagnosis of influenza from Nasopharyngeal swab specimens and should not be used as a sole basis for treatment. Nasal washings and aspirates are unacceptable for Xpert Xpress SARS-CoV-2/FLU/RSV testing.  Fact Sheet for Patients: EntrepreneurPulse.com.au  Fact Sheet for Healthcare Providers: IncredibleEmployment.be  This test is not yet approved or cleared by the [place] FDA and has been authorized for detection and/or diagnosis of SARS-CoV-2 by FDA under an Emergency Use Authorization (EUA). This EUA will remain in effect (meaning this test can be used) for the duration of the COVID-19 declaration under Section 564(b)(1) of the Act, 21 U.S.C. section 360bbb-3(b)(1), unless the authorization is terminated or revoked.  Performed at [facility], [address], [place], [place] [address]      Labs: Basic Metabolic Panel: Recent Labs  Lab 05/14/21 0814 05/15/21 0322 05/16/21 0413 05/17/21 2006 05/18/21 1147  NA 127* 132* 135 136 134*  K 4.4 4.3 3.9 4.4 4.1  CL 90* 91* 94* 96* 96*  CO2 '23 28 29 28 25  '$ GLUCOSE 342* 216* 89 138* 125*  BUN 99* 69* 52* 61* 65*  CREATININE 9.25* 7.19* 6.39* 8.07* 8.66*  CALCIUM 8.6* 8.5* 8.6* 8.6* 8.4*  MG  --   --   --   --  2.3  PHOS  --   --   --   --  5.5*   Liver Function Tests: Recent Labs  Lab 05/16/21 0413 05/17/21 2006 05/18/21 1147  AST 12* 12* 13*  ALT '14 16 16  '$ ALKPHOS 49 48 45  BILITOT 0.5 0.4 0.6  PROT 7.2 6.9 6.3*  ALBUMIN 3.4* 3.5 3.1*   No results for  input(s): LIPASE, AMYLASE in the last 168 hours. No results for input(s): AMMONIA in the last 168 hours. CBC: Recent Labs  Lab 05/14/21 0814 05/15/21 0322 05/16/21 0413 05/17/21 2006 05/18/21 1147  WBC 13.9* 9.1 8.3 7.5 5.9  NEUTROABS  --   --   --  3.9 2.7  HGB 11.4* 10.8* 11.5* 9.8* 9.8*  HCT 36.2* 34.2* 35.8* 30.9* 30.9*  MCV 92.1 92.2 92.7 92.8  92.2  PLT 287 272 287 246 265   Cardiac Enzymes: No results for input(s): CKTOTAL, CKMB, CKMBINDEX, TROPONINI in the last 168 hours. BNP: BNP (last 3 results) Recent Labs    04/20/21 1216 05/15/21 0322 05/17/21 2006  BNP 208.0* 1,176.0* 199.0*    ProBNP (last 3 results) No results for input(s): PROBNP in the last 8760 hours.  CBG: Recent Labs  Lab 05/15/21 1152 05/15/21 1649 05/15/21 2135 05/18/21 0058 05/18/21 0759  GLUCAP 173* 99 219* 218* 151*       Signed:  Alma Friendly, MD [facility] 05/18/2021, 6:18 PM

## 2021-05-18 NOTE — Progress Notes (Signed)
Pt refused all blood draws, states he wants his blood drawn in dialysis.

## 2021-05-18 NOTE — H&P (Signed)
TRH H&P    Patient Demographics:    Kelly Key, is a 50 y.o. male  MRN: [id number]  DOB - Sep 25, 1971  Admit Date - 05/17/2021  Referring MD/NP/PA: Roderic Palau  Outpatient Primary MD for the patient is [name], MD  Patient coming from: Home  Chief complaint- dyspnea   HPI:    Kelly Key  is a 50 y.o. male, with history of ESRD, congestive diastolic heart failure, COPD, diabetes mellitus type 2, dyspnea, hyperlipidemia, hypertension, obesity, and more presents the ED with a chief complaint of dyspnea.  Of note patient was recently discharged on July 21.  At that time he was admitted on July 19 for shortness of breath.  He attempted to go to his hemodialysis center and prior to starting HD they sent him to the ER secondary to hypoxia.  EMS placed the patient on CPAP, gave nitro, and Versed.  Patient was in hypertensive crisis at that time as well with a blood pressure of 260/150.  Troponins were done in the ED are minimally elevated at 24.  ABG showed some CO2 retention and decreased pH at 7.2 so patient was placed on BiPAP.  Patient was seen by nephrology and dialyzed.  He was discharged, and told to come back to the ER if he got short of breath.  Patient reports that around 4 PM on July 22 he became acutely short of breath.  He was outside with minimal exertion when the symptoms started.  He reports that dyspnea was worse with exertion, better with rest.  He does admit to orthopnea.  He denies wheeze or chest pain.  He does admit to feeling like his feet were tight at that time.  He had palpitations.  No nausea or vomiting.  He has had abdominal pain as well.  He reports that its right lower quadrant, better with Alka-Seltzer and having a bowel movement.  Eating has no effect on it.  Denies constipation at this time.  Patient has no other complaints at this time.  Patient does not smoke, does not drink.  He is vaccinated for  COVID.  Patient is full code.  In the ED Temp 98.2, heart rate 86-89, respiratory rate 16-24, blood pressure 162/99, satting at 100% No leukocytosis with a white blood cell count of 7.5, hemoglobin 9.8 BUN and creatinine are elevated at 61 and 8.07-patient is due for dialysis in the a.m. BNP is slightly elevated 199 Troponin initially 112, and then 100 Chest x-ray shows vascular congestion, peribronchial thickening that might be pulmonary edema or bronchitis EKG shows a heart rate of 88, sinus rhythm, QTC 497 with diffuse T wave inversions Cardiology was consulted from the ED and will advised heparin if the troponin increased, however the troponin was flat.  Cardiology advised that patient could stay here any pain but since we do not have cardiology this weekend we will transfer to [facility].    Review of systems:    In addition to the HPI above,  No Fever-chills, No Headache, No changes with Vision or hearing, No  problems swallowing food or Liquids, No Chest pain, admits to shortness of breath Admits to abdominal pain, No Nausea or Vomiting, bowel movements are regular, No Blood in stool or Urine, No dysuria, No new skin rashes or bruises, No new joints pains-aches,  No new weakness, tingling, numbness in any extremity, No recent weight gain or loss, No polyuria, polydypsia or polyphagia, No significant Mental Stressors.  All other systems reviewed and are negative.    Past History of the following :    Past Medical History:  Diagnosis Date   Acute diastolic CHF (congestive heart failure) ([place]) 06/11/2012   EF 50-55% [facility])   Chronic kidney disease    COPD (chronic obstructive pulmonary disease) (HCC)    Diabetes mellitus    A1c 11.5 (06/11/2012).   Dyspnea    Gout    Hepatic steatosis 06/11/2012   Elevated LFTs   Hyperlipemia    Malignant hypertension    Microcytic anemia 06/12/2012   Obesity       Past Surgical History:  Procedure Laterality Date    AV FISTULA PLACEMENT Left 11/11/2019   Procedure: ARTERIOVENOUS (AV) FISTULA CREATION LEFT BRACHIOCEPHALIC ARM;  Surgeon: Serafina Mitchell, MD;  Location: [facility] OR;  Service: Vascular;  Laterality: Left;   IR FLUORO GUIDE CV LINE RIGHT  07/08/2019   IR REMOVAL TUN CV CATH W/O FL  03/23/2020   IR US GUIDE VASC ACCESS RIGHT  07/08/2019   NO PAST SURGERIES     VASCULAR SURGERY        Social History:      Social History   Tobacco Use   Smoking status: Never   Smokeless tobacco: Never  Substance Use Topics   Alcohol use: Not Currently    Comment: haven't drank in over 5 years        Family History :     Family History  Problem Relation Age of Onset   Gout Mother    Asthma Mother    Diabetes Father    Heart failure Father    Diabetes Sister    Hypertension Brother    Pancreatic cancer Brother    Diabetes Sister       Home Medications:   Prior to Admission medications   Medication Sig Start Date End Date Taking? Authorizing Provider  acetaminophen (TYLENOL) 500 MG tablet Take 1,000 mg by mouth every 6 (six) hours as needed for moderate pain or headache.   Yes [provider]  albuterol (VENTOLIN HFA) 108 (90 Base) MCG/ACT inhaler Inhale 2 puffs into the lungs every 4 (four) hours as needed for wheezing or shortness of breath.  03/24/19  Yes [provider]  allopurinol (ZYLOPRIM) 100 MG tablet Take 2 tablets (200 mg total) by mouth daily. 08/04/19  Yes Love, Ivan Anchors, PA-C  amLODipine (NORVASC) 10 MG tablet Take 0.5 tablets (5 mg total) by mouth daily. 05/16/21 06/15/21 Yes Shahmehdi, Valeria Batman, MD  aspirin EC 81 MG EC tablet Take 1 tablet (81 mg total) by mouth daily. 08/04/19  Yes Love, Ivan Anchors, PA-C  atorvastatin (LIPITOR) 80 MG tablet Take 1 tablet (80 mg total) by mouth daily. 12/05/19  Yes Branch, Alphonse Guild, MD  Calcium Carbonate Antacid (CALCIUM CARBONATE, DOSED IN MG ELEMENTAL CALCIUM,) 1250 MG/5ML SUSP Take 5 mLs (500 mg of elemental calcium total) by mouth  every 6 (six) hours as needed for indigestion. 07/16/19  Yes Amin, Ankit Chirag, MD  carvedilol (COREG) 25 MG tablet Take 1 tablet (25 mg total) by  mouth 2 (two) times daily with a meal. 08/03/19  Yes Love, Ivan Anchors, PA-C  cloNIDine (CATAPRES) 0.3 MG tablet Take 1 tablet (0.3 mg total) by mouth 3 (three) times daily. 08/03/19 05/17/21 Yes Love, Ivan Anchors, PA-C  ezetimibe (ZETIA) 10 MG tablet Take 10 mg by mouth every evening. 01/20/19  Yes [provider]  fluticasone (FLONASE) 50 MCG/ACT nasal spray Place 1 spray into both nostrils daily. Patient taking differently: Place 1 spray into both nostrils daily as needed for allergies. 04/04/18 05/17/21 Yes Shah, Pratik D, DO  furosemide (LASIX) 80 MG tablet Take 1 tablet (80 mg total) by mouth daily. 05/16/21 06/15/21 Yes Shahmehdi, Valeria Batman, MD  gabapentin (NEURONTIN) 100 MG capsule Take 1 capsule (100 mg total) by mouth 3 (three) times daily. 08/03/19  Yes Love, Ivan Anchors, PA-C  insulin detemir (LEVEMIR FLEXTOUCH) 100 UNIT/ML FlexPen Inject 50 Units into the skin at bedtime. 03/11/21  Yes Cassandria Anger, MD  linaclotide Rolan Lipa) 145 MCG CAPS capsule Take 1 capsule (145 mcg total) by mouth daily before breakfast. 04/05/18  Yes Manuella Ghazi, Pratik D, DO  multivitamin (RENA-VIT) TABS tablet Take 1 tablet by mouth at bedtime. 08/03/19  Yes Love, Ivan Anchors, PA-C  Na Sulfate-K Sulfate-Mg Sulf (SUPREP BOWEL PREP KIT) 17.5-3.13-1.6 GM/177ML SOLN Take 1 kit by mouth as directed. 04/11/21  Yes Wohl, Darren, MD  NOVOLOG FLEXPEN 100 UNIT/ML FlexPen INJECT 8 TO 14 UNITS SUBCUTANEOUSLY THREE TIMES DAILY WITH MEALS Patient taking differently: Inject 8 to 14 subcutaneously 3 times a day after meals sliding scale 03/11/21  Yes Nida, Marella Chimes, MD  oxyCODONE-acetaminophen (PERCOCET) 5-325 MG tablet Take 1-2 tablets by mouth every 6 (six) hours as needed. 04/24/21  Yes Delo, Nathaneil Canary, MD  OZEMPIC, 0.25 OR 0.5 MG/DOSE, 2 MG/1.5ML SOPN Inject 0.25 mg into the skin once a week.  11/23/20  Yes [provider]  predniSONE (DELTASONE) 10 MG tablet Take 2 tablets (20 mg total) by mouth 2 (two) times daily. 04/24/21  Yes Delo, Nathaneil Canary, MD  sevelamer carbonate (RENVELA) 800 MG tablet Take 3 tablets (2,400 mg total) by mouth 3 (three) times daily with meals. 08/03/19  Yes Love, Ivan Anchors, PA-C  SSD 1 % cream Apply topically. 05/13/21  Yes [provider]  valACYclovir (VALTREX) 1000 MG tablet Take 1 tablet (1,000 mg total) by mouth 3 (three) times daily. 04/24/21  Yes Delo, Nathaneil Canary, MD  amoxicillin (AMOXIL) 500 MG capsule Take 500 mg by mouth 2 (two) times daily. Patient not taking: Reported on 05/17/2021 05/13/21   [provider]  diclofenac sodium (VOLTAREN) 1 % GEL Apply 2 g topically 4 (four) times daily. Patient taking differently: Apply 2 g topically 4 (four) times daily as needed (pain). 08/03/19   Love, Ivan Anchors, PA-C  glucose blood (ACCU-CHEK AVIVA PLUS) test strip Use as instructed TO CHECK BLOOD GLUCOSE THREE TIMES DAILY 08/15/20   Nida, Marella Chimes, MD  HYDROcodone-acetaminophen (NORCO/VICODIN) 5-325 MG tablet Take 1 tablet by mouth every 6 (six) hours as needed for severe pain. Patient not taking: No sig reported 11/11/19   Dagoberto Ligas, PA-C  Insulin Pen Needle (B-D ULTRAFINE III SHORT PEN) 31G X 8 MM MISC 1 each by Does not apply route as directed. 08/13/20   Cassandria Anger, MD     Allergies:     Allergies  Allergen Reactions   Lisinopril Swelling   Hydralazine Anxiety    Hallucinations      Physical Exam:   Vitals  Blood pressure (!) 162/99,  pulse 89, temperature 98.2 F (36.8 C), temperature source Oral, resp. rate 19, height 6' (1.829 m), weight (!) 136.8 kg, SpO2 100 %.  1.  General: Patient lying supine in bed,  no acute distress   2. Psychiatric: Alert and oriented x 3, mood and behavior normal for situation, pleasant and cooperative with exam   3. Neurologic: Speech and language are normal, face is  symmetric, moves all 4 extremities voluntarily, at baseline without acute deficits on limited exam   4. HEENMT:  Head is atraumatic, normocephalic, pupils reactive to light, neck is supple, trachea is midline, mucous membranes are moist   5. Respiratory : Lungs are clear to auscultation bilaterally without wheezing, rhonchi, rales, no cyanosis, no increase in work of breathing or accessory muscle use   6. Cardiovascular : Heart rate normal, rhythm is regular, no murmurs, rubs or gallops, 2+ pitting edema, peripheral pulses palpated   7. Gastrointestinal:  Abdomen is soft, nondistended, nontender to palpation bowel sounds active, no masses or organomegaly palpated   8. Skin:  Skin is warm, dry and intact without rashes, acute lesions, or ulcers on limited exam   9.Musculoskeletal:  No acute deformities or trauma, no asymmetry in tone, peripheral edema present in bilateral lower extremities, peripheral pulses palpated, no tenderness to palpation in the extremities     Data Review:    CBC Recent Labs  Lab 05/14/21 0814 05/15/21 0322 05/16/21 0413 05/17/21 2006  WBC 13.9* 9.1 8.3 7.5  HGB 11.4* 10.8* 11.5* 9.8*  HCT 36.2* 34.2* 35.8* 30.9*  PLT 287 272 287 246  MCV 92.1 92.2 92.7 92.8  MCH 29.0 29.1 29.8 29.4  MCHC 31.5 31.6 32.1 31.7  RDW 15.9* 16.1* 16.4* 16.7*  LYMPHSABS  --   --   --  2.5  MONOABS  --   --   --  0.7  EOSABS  --   --   --  0.3  BASOSABS  --   --   --  0.0   ------------------------------------------------------------------------------------------------------------------  Results for orders placed or performed during the hospital encounter of 05/17/21 (from the past 48 hour(s))  CBC with Differential/Platelet     Status: Abnormal   Collection Time: 05/17/21  8:06 PM  Result Value Ref Range   WBC 7.5 4.0 - 10.5 K/uL   RBC 3.33 (L) 4.22 - 5.81 MIL/uL   Hemoglobin 9.8 (L) 13.0 - 17.0 g/dL   HCT 30.9 (L) 39.0 - 52.0 %   MCV 92.8 80.0 - 100.0 fL   MCH  29.4 26.0 - 34.0 pg   MCHC 31.7 30.0 - 36.0 g/dL   RDW 16.7 (H) 11.5 - 15.5 %   Platelets 246 150 - 400 K/uL   nRBC 0.0 0.0 - 0.2 %   Neutrophils Relative % 53 %   Neutro Abs 3.9 1.7 - 7.7 K/uL   Lymphocytes Relative 33 %   Lymphs Abs 2.5 0.7 - 4.0 K/uL   Monocytes Relative 9 %   Monocytes Absolute 0.7 0.1 - 1.0 K/uL   Eosinophils Relative 5 %   Eosinophils Absolute 0.3 0.0 - 0.5 K/uL   Basophils Relative 0 %   Basophils Absolute 0.0 0.0 - 0.1 K/uL   Immature Granulocytes 0 %   Abs Immature Granulocytes 0.02 0.00 - 0.07 K/uL    Comment: Performed at [facility], [address], [place], [place] [address]  Comprehensive metabolic panel     Status: Abnormal   Collection Time: 05/17/21  8:06 PM  Result Value  Ref Range   Sodium 136 135 - 145 mmol/L   Potassium 4.4 3.5 - 5.1 mmol/L   Chloride 96 (L) 98 - 111 mmol/L   CO2 28 22 - 32 mmol/L   Glucose, Bld 138 (H) 70 - 99 mg/dL    Comment: Glucose reference range applies only to samples taken after fasting for at least 8 hours.   BUN 61 (H) 6 - 20 mg/dL   Creatinine, Ser 8.07 (H) 0.61 - 1.24 mg/dL   Calcium 8.6 (L) 8.9 - 10.3 mg/dL   Total Protein 6.9 6.5 - 8.1 g/dL   Albumin 3.5 3.5 - 5.0 g/dL   AST 12 (L) 15 - 41 U/L   ALT 16 0 - 44 U/L   Alkaline Phosphatase 48 38 - 126 U/L   Total Bilirubin 0.4 0.3 - 1.2 mg/dL   GFR, Estimated 7 (L) >60 mL/min    Comment: (NOTE) Calculated using the CKD-EPI Creatinine Equation (2021)    Anion gap 12 5 - 15    Comment: Performed at [facility], [address], [place], [place] [address]  Troponin I (High Sensitivity)     Status: Abnormal   Collection Time: 05/17/21  8:06 PM  Result Value Ref Range   Troponin I (High Sensitivity) 112 (HH) <18 ng/L    Comment: CRITICAL RESULT CALLED TO, READ BACK BY AND VERIFIED WITH: PRUITT,G ON 05/17/21 AT 2135 BY LOY,C (NOTE) Elevated high sensitivity troponin I (hsTnI) values and significant  changes across serial measurements may suggest ACS but many  other  chronic and acute conditions are known to elevate hsTnI results.  Refer to the Links section for chest pain algorithms and additional  guidance. Performed at [facility], [address] [place], [place] [address]   Brain natriuretic peptide     Status: Abnormal   Collection Time: 05/17/21  8:06 PM  Result Value Ref Range   B Natriuretic Peptide 199.0 (H) 0.0 - 100.0 pg/mL    Comment: Performed at [facility], [address] [place], [place] [address]  Resp Panel by RT-PCR (Flu A&B, Covid) Nasopharyngeal Swab     Status: None   Collection Time: 05/17/21  9:54 PM   Specimen: Nasopharyngeal Swab; Nasopharyngeal(NP) swabs in vial transport medium  Result Value Ref Range   SARS Coronavirus 2 by RT PCR NEGATIVE NEGATIVE    Comment: (NOTE) SARS-CoV-2 target nucleic acids are NOT DETECTED.  The SARS-CoV-2 RNA is generally detectable in upper respiratory specimens during the acute phase of infection. The lowest concentration of SARS-CoV-2 viral copies this assay can detect is 138 copies/mL. A negative result does not preclude SARS-Cov-2 infection and should not be used as the sole basis for treatment or other patient management decisions. A negative result may occur with  improper specimen collection/handling, submission of specimen other than nasopharyngeal swab, presence of viral mutation(s) within the areas targeted by this assay, and inadequate number of viral copies(<138 copies/mL). A negative result must be combined with clinical observations, patient history, and epidemiological information. The expected result is Negative.  Fact Sheet for Patients:  EntrepreneurPulse.com.au  Fact Sheet for Healthcare Providers:  IncredibleEmployment.be  This test is no t yet approved or cleared by the [place] FDA and  has been authorized for detection and/or diagnosis of SARS-CoV-2 by FDA under an Emergency Use Authorization (EUA). This EUA will  remain  in effect (meaning this test can be used) for the duration of the COVID-19 declaration under Section 564(b)(1) of the Act, 21 U.S.C.section  360bbb-3(b)(1), unless the authorization is terminated  or revoked sooner.       Influenza A by PCR NEGATIVE NEGATIVE   Influenza B by PCR NEGATIVE NEGATIVE    Comment: (NOTE) The Xpert Xpress SARS-CoV-2/FLU/RSV plus assay is intended as an aid in the diagnosis of influenza from Nasopharyngeal swab specimens and should not be used as a sole basis for treatment. Nasal washings and aspirates are unacceptable for Xpert Xpress SARS-CoV-2/FLU/RSV testing.  Fact Sheet for Patients: EntrepreneurPulse.com.au  Fact Sheet for Healthcare Providers: IncredibleEmployment.be  This test is not yet approved or cleared by the [place] FDA and has been authorized for detection and/or diagnosis of SARS-CoV-2 by FDA under an Emergency Use Authorization (EUA). This EUA will remain in effect (meaning this test can be used) for the duration of the COVID-19 declaration under Section 564(b)(1) of the Act, 21 U.S.C. section 360bbb-3(b)(1), unless the authorization is terminated or revoked.  Performed at [facility], [address], [place], [place] [address]   Troponin I (High Sensitivity)     Status: Abnormal   Collection Time: 05/17/21 10:03 PM  Result Value Ref Range   Troponin I (High Sensitivity) 100 (HH) <18 ng/L    Comment: CRITICAL VALUE NOTED.  VALUE IS CONSISTENT WITH PREVIOUSLY REPORTED AND CALLED VALUE. (NOTE) Elevated high sensitivity troponin I (hsTnI) values and significant  changes across serial measurements may suggest ACS but many other  chronic and acute conditions are known to elevate hsTnI results.  Refer to the Links section for chest pain algorithms and additional  guidance. Performed at [facility], [address] [place], [place] [address]     Chemistries  Recent Labs  Lab  05/14/21 [phone] 05/15/21 0322 05/16/21 0413 05/17/21 2006  NA 127* 132* 135 136  K 4.4 4.3 3.9 4.4  CL 90* 91* 94* 96*  CO2 23 28 29 28   GLUCOSE 342* 216* 89 138*  BUN 99* 69* 52* 61*  CREATININE 9.25* 7.19* 6.39* 8.07*  CALCIUM 8.6* 8.5* 8.6* 8.6*  AST  --   --  12* 12*  ALT  --   --  14 16  ALKPHOS  --   --  49 48  BILITOT  --   --  0.5 0.4   ------------------------------------------------------------------------------------------------------------------  ------------------------------------------------------------------------------------------------------------------ GFR: Estimated Creatinine Clearance: 15.3 mL/min (A) (by C-G formula based on SCr of 8.07 mg/dL (H)). Liver Function Tests: Recent Labs  Lab 05/16/21 0413 05/17/21 2006  AST 12* 12*  ALT 14 16  ALKPHOS 49 48  BILITOT 0.5 0.4  PROT 7.2 6.9  ALBUMIN 3.4* 3.5   No results for input(s): LIPASE, AMYLASE in the last 168 hours. No results for input(s): AMMONIA in the last 168 hours. Coagulation Profile: No results for input(s): INR, PROTIME in the last 168 hours. Cardiac Enzymes: No results for input(s): CKTOTAL, CKMB, CKMBINDEX, TROPONINI in the last 168 hours. BNP (last 3 results) No results for input(s): PROBNP in the last 8760 hours. HbA1C: No results for input(s): HGBA1C in the last 72 hours. CBG: Recent Labs  Lab 05/14/21 1933 05/15/21 0748 05/15/21 1152 05/15/21 1649 05/15/21 2135  GLUCAP 283* 157* 173* 99 219*   Lipid Profile: No results for input(s): CHOL, HDL, LDLCALC, TRIG, CHOLHDL, LDLDIRECT in the last 72 hours. Thyroid Function Tests: No results for input(s): TSH, T4TOTAL, FREET4, T3FREE, THYROIDAB in the last 72 hours. Anemia Panel: No results for input(s): VITAMINB12, FOLATE, FERRITIN, TIBC, IRON, RETICCTPCT in the last 72 hours.  --------------------------------------------------------------------------------------------------------------- Urine analysis:  Component Value  Date/Time   COLORURINE STRAW (A) 04/20/2021 1740   APPEARANCEUR CLEAR 04/20/2021 1740   LABSPEC 1.011 04/20/2021 1740   PHURINE 8.0 04/20/2021 1740   GLUCOSEU 150 (A) 04/20/2021 1740   HGBUR NEGATIVE 04/20/2021 1740   BILIRUBINUR NEGATIVE 04/20/2021 1740   KETONESUR NEGATIVE 04/20/2021 1740   PROTEINUR 100 (A) 04/20/2021 1740   UROBILINOGEN 0.2 06/11/2012 0518   NITRITE NEGATIVE 04/20/2021 1740   LEUKOCYTESUR NEGATIVE 04/20/2021 1740      Imaging Results:    DG Chest 2 View  Result Date: 05/17/2021 CLINICAL DATA:  Shortness of breath.  Progressive.  History of COPD. EXAM: CHEST - 2 VIEW COMPARISON:  Radiograph 05/15/2021, 2 days ago. FINDINGS: Mild cardiomegaly stable. Unchanged mediastinal contours. Peribronchial thickening with increase from prior exam. Background vascular congestion. No septal thickening. No confluent airspace disease. Stable osseous structures with mild chronic wedging of lower thoracic vertebra IMPRESSION: Similar mild cardiomegaly. Vascular congestion. Peribronchial thickening with increase from prior exam, may be pulmonary edema or bronchitis. Electronically Signed   By: Keith Rake M.D.   On: 05/17/2021 20:10       Assessment & Plan:    Active Problems:   Essential hypertension, benign   Dyslipidemia   Chronic combined systolic and diastolic CHF (congestive heart failure) (HCC)   ESRD (end stage renal disease) ([place])   Uncontrolled type 2 diabetes mellitus with hyperglycemia (HCC)   NSTEMI (non-ST elevated myocardial infarction) (HCC)   NSTEMI Troponin is elevated but flat at 112 and 100 Monitor on telemetry EKG shows diffuse T wave inversions Likely demand ischemia in the setting of shortness of breath, but patient will need to be monitored overnight with those EKG changes Chest x-ray shows vascular congestion and possible pulmonary edema versus bronchitis Continue aspirin, Coreg, statin Notify cardiology follow-up on patient's arrival to [place] to monitor ESRD Patient will need dialysis in a.m. Consult to nephrology placed but not called No need for emergent dialysis at this time BUN 61, creatinine 8.07, potassium 4.4 Fluid restriction Continue to monitor Hypertension Continue amlodipine, clonidine, Coreg Continue to monitor Hyperlipidemia Continue statin Chronic CHF Last echo was done on July 20 and shows an ejection fraction of 55 to 60% with normal LV function.  The LV demonstrates regional wall motion abnormalities, ventricular hypertrophy, normal diastolic parameters Patient does have some pitting edema on orthopnea 80 mg of Lasix p.o. at home, continue 40 mg of Lasix IV twice daily Continue aspirin, Coreg, statin Continue to monitor Diabetes mellitus type 2 Continue long-acting insulin, sliding scale coverage, carb modified diet Last hemoglobin A1c was July 19 and was 8.2    DVT Prophylaxis-   Heparin- SCDs   AM Labs Ordered, also please review Full Orders  Family Communication: No family at bedside  Code Status: Full  Admission status: Observation Time spent in minutes : [place]

## 2021-05-28 NOTE — Patient Instructions (Signed)

## 2021-05-28 NOTE — Discharge Summary (Signed)
Please see full discharge summary from Dr. Valeria Batman Shahmehdi dated 05/16/2021  Roxan Hockey, MD

## 2021-05-29 ENCOUNTER — Encounter: Payer: Medicare Other | Admitting: Nurse Practitioner

## 2021-05-29 ENCOUNTER — Other Ambulatory Visit: Payer: Self-pay

## 2021-05-29 VITALS — Ht 67.0 in

## 2021-05-29 DIAGNOSIS — E1122 Type 2 diabetes mellitus with diabetic chronic kidney disease: Secondary | ICD-10-CM

## 2021-05-29 DIAGNOSIS — E782 Mixed hyperlipidemia: Secondary | ICD-10-CM

## 2021-05-29 DIAGNOSIS — I10 Essential (primary) hypertension: Secondary | ICD-10-CM

## 2021-06-09 NOTE — Progress Notes (Signed)
Cardiology Office Note  Date: 06/10/2021   ID: Kelly Key, DOB [birth year], MRN [id number]  PCP:  [name], MD  Cardiologist:  Carlyle Dolly, MD Electrophysiologist:  None   Chief Complaint: Hospital follow up NSTEMI  History of Present Illness: Kelly Key is a 50 y.o. male with a history of chronic diastolic heart failure, ESRD, hypertension  Echo 03/2018 LVEF 60 to 65%, no WMA's, indeterminate diastolic function. Echo 06/16/2019 LVEF 50 to 55%, indeterminate diastolic function.  Last encounter 08/29/2019 with Dr. Harl Bowie via telemedicine visit.  Had no recent SOB or DOE.  Fluid management per HD.  Was still making some urine.  Dry weight was 286.  Admission on 07/17/2019 prolonged rehab stay after discharge.  Admitted with anasarca and started on dialysis.  Dialysis on Tuesday, Thursday, Saturday, no recent lower extremity edema.  Blood pressures on non-HD days 130s over 90s.  Notes current symptoms with chronic diastolic heart failure.  HD per nephrology.  Reasonable control given issues with labile blood pressures on HD days  He was previously here for 59-monthfollow-up.  Was continuing with dialysis on Tuesdays Thursdays and Saturdays.  Continues with labile blood pressures.  He was taking his blood pressure medicines prior to dialysis on dialysis days because if he does not his blood pressure is significantly elevated.  He was having issues with ED and may see a doctor in the future to explore medication to help.  He had tried Viagra and Cialis in the past which do not appear to work anymore.  He believed it was associated with his blood pressure medication.  He complained of issues with stopping breathing sometimes at home.  Complained of significant snoring and excessive daytime sleepiness.  He denies any significant anginal or exertional symptoms, palpitations or arrhythmias, orthostatic symptoms other than when then during dialysis.  He was trying to lose weight.  He had been  placed on the kidney list for possible kidney transplant in the future.  [facility].  He had recently been discharged on May 16, 2021 after being admitted for shortness of breath.  He became acutely short of breath on May 17, 2021.  Dyspnea was worse with exertion improved with rest.  He admitted to orthopnea.  Troponins were elevated but with flat trend at 112-100.  Chest x-ray showed vascular congestion.  EKG was sinus rhythm QTC of 497 with diffuse T wave inversions.  [facility]  Diagnosis was NSTEMI.  He was admitted for further management.  Cardiology recommended cardiac catheterization.  Patient wanted to be discharged and signed out AMA.  .Plans were to follow-up with cardiology as an outpatient.  He was continuing hemodialysis and Lasix for fluid management for diastolic heart failure.  He is here today for follow-up after recent hospital stay for diagnosis of NSTEMI.  He had signed out AMA.  Cardiology had recommended he undergo a cardiac catheterization.  He presents today stating he is willing to undergo cardiac catheterization.  He currently denies any anginal or exertional symptoms.  We spoke about his elevated enzymes and reasons for recommendation for cardiac catheterization given his multiple risk factors.  I thoroughly discussed risk and benefits with he and his wife.  He is willing to proceed with cardiac catheterization per cardiologist recommendations.  He currently denies any SOB or DOE.  No  orthostatic symptoms, CVA or TIA-like symptoms, PND, orthopnea.  His weight is up but he states it fluctuates.  His discharge weight was around 279 pounds from recent hospital visit.  Today's weight is 307.  He is scheduled for dialysis tomorrow.  His wife would like for him to have the cardiac catheterization either on a Wednesday or  Thursday per her request.    Past Medical History:  Diagnosis Date   Acute diastolic CHF (congestive heart failure) ([place]) 06/11/2012   EF 50-55% [facility])   Chronic kidney disease    COPD (chronic obstructive pulmonary disease) (HCC)    Diabetes mellitus    A1c 11.5 (06/11/2012).   Dyspnea    Gout    Hepatic steatosis 06/11/2012   Elevated LFTs   Hyperlipemia    Malignant hypertension    Microcytic anemia 06/12/2012   Obesity     Past Surgical History:  Procedure Laterality Date   AV FISTULA PLACEMENT Left 11/11/2019   Procedure: ARTERIOVENOUS (AV) FISTULA CREATION LEFT BRACHIOCEPHALIC ARM;  Surgeon: Serafina Mitchell, MD;  Location: [facility] OR;  Service: Vascular;  Laterality: Left;   IR FLUORO GUIDE CV LINE RIGHT  07/08/2019   IR REMOVAL TUN CV CATH W/O FL  03/23/2020   IR US GUIDE VASC ACCESS RIGHT  07/08/2019   NO PAST SURGERIES     VASCULAR SURGERY      Current Outpatient Medications  Medication Sig Dispense Refill   acetaminophen (TYLENOL) 500 MG tablet Take 1,000 mg by mouth every 6 (six) hours as needed for moderate pain or headache.     albuterol (VENTOLIN HFA) 108 (90 Base) MCG/ACT inhaler Inhale 2 puffs into the lungs every 4 (four) hours as needed for wheezing or shortness of breath.      allopurinol (ZYLOPRIM) 100 MG tablet Take 2 tablets (200 mg total) by mouth daily. 30 tablet 0   amLODipine (NORVASC) 10 MG tablet Take 0.5 tablets (5 mg total) by mouth daily. 30 tablet 1   aspirin EC 81 MG EC tablet Take 1 tablet (81 mg total) by mouth daily. 30 tablet 0   atorvastatin (LIPITOR) 80 MG tablet Take 1 tablet (80 mg total) by mouth daily. 90 tablet 3   Calcium Carbonate Antacid (CALCIUM CARBONATE, DOSED IN MG ELEMENTAL CALCIUM,) 1250 MG/5ML SUSP Take 5 mLs (500 mg of elemental calcium total) by mouth every 6 (six) hours as needed for indigestion. 450 mL    carvedilol (COREG) 25 MG tablet Take 1 tablet (25 mg total) by mouth 2 (two) times daily with a meal. 60 tablet 0    cloNIDine (CATAPRES) 0.3 MG tablet Take 0.3 mg by mouth 3 (three) times daily.     diclofenac sodium (VOLTAREN) 1 % GEL Apply 2 g topically 4 (four) times daily. (Patient taking differently: Apply 2 g topically 4 (four) times daily as needed (pain).)     ezetimibe (ZETIA) 10 MG tablet Take 10 mg by mouth every evening.     fluticasone (FLONASE) 50 MCG/ACT nasal spray Place 1 spray into both nostrils daily as needed for allergies or rhinitis.     furosemide (LASIX) 80 MG tablet Take 1 tablet (80 mg total) by mouth daily. 30 tablet 1   gabapentin (NEURONTIN) 100 MG capsule Take 1 capsule (100 mg total) by mouth 3 (three) times daily. 90 capsule 0   glucose blood (ACCU-CHEK AVIVA PLUS) test strip Use as instructed TO CHECK BLOOD GLUCOSE THREE TIMES DAILY 200 each 3   HYDROcodone-acetaminophen (NORCO/VICODIN) 5-325  MG tablet Take 1 tablet by mouth every 6 (six) hours as needed for severe pain. 10 tablet 0   insulin detemir (LEVEMIR FLEXTOUCH) 100 UNIT/ML FlexPen Inject 50 Units into the skin at bedtime. 15 mL 2   Insulin Pen Needle (B-D ULTRAFINE III SHORT PEN) 31G X 8 MM MISC 1 each by Does not apply route as directed. 100 each 3   linaclotide (LINZESS) 145 MCG CAPS capsule Take 1 capsule (145 mcg total) by mouth daily before breakfast. 30 capsule 0   multivitamin (RENA-VIT) TABS tablet Take 1 tablet by mouth at bedtime. 30 tablet 0   Na Sulfate-K Sulfate-Mg Sulf (SUPREP BOWEL PREP KIT) 17.5-3.13-1.6 GM/177ML SOLN Take 1 kit by mouth as directed. 354 mL 0   NOVOLOG FLEXPEN 100 UNIT/ML FlexPen INJECT 8 TO 14 UNITS SUBCUTANEOUSLY THREE TIMES DAILY WITH MEALS (Patient taking differently: Inject 8 to 14 subcutaneously 3 times a day after meals sliding scale) 15 mL 2   oxyCODONE-acetaminophen (PERCOCET) 5-325 MG tablet Take 1-2 tablets by mouth every 6 (six) hours as needed. 12 tablet 0   OZEMPIC, 0.25 OR 0.5 MG/DOSE, 2 MG/1.5ML SOPN Inject 0.25 mg into the skin once a week.     sevelamer carbonate  (RENVELA) 800 MG tablet Take 3 tablets (2,400 mg total) by mouth 3 (three) times daily with meals. 270 tablet 0   SSD 1 % cream Apply topically.     valACYclovir (VALTREX) 1000 MG tablet Take 1 tablet (1,000 mg total) by mouth 3 (three) times daily. 21 tablet 0   amoxicillin (AMOXIL) 500 MG capsule Take 500 mg by mouth 2 (two) times daily. (Patient not taking: Reported on 06/10/2021)     predniSONE (DELTASONE) 10 MG tablet Take 2 tablets (20 mg total) by mouth 2 (two) times daily. (Patient not taking: Reported on 06/10/2021) 20 tablet 0   Current Facility-Administered Medications  Medication Dose Route Frequency Provider Last Rate Last Admin   0.9 %  sodium chloride infusion   Intravenous PRN Marcello Fennel, PA-C       bebtelovimab EUA injection SOLN 175 mg  175 mg Intravenous Once Marcello Fennel, PA-C       Allergies:  Lisinopril and Hydralazine   Social History: The patient  reports that he has been smoking cigarettes and cigars. He has never used smokeless tobacco. He reports that he does not currently use alcohol. He reports that he does not use drugs.   Family History: The patient's family history includes Asthma in his mother; Diabetes in his father, sister, and sister; Gout in his mother; Heart failure in his father; Hypertension in his brother; Pancreatic cancer in his brother.   ROS:  Please see the history of present illness. Otherwise, complete review of systems is positive for none.  All other systems are reviewed and negative.   Physical Exam: VS:  BP 138/64   Pulse (!) 52   Ht 5' 7"  (1.702 m)   Wt (!) 307 lb 3.2 oz (139.3 kg)   SpO2 98%   BMI 48.11 kg/m , BMI Body mass index is 48.11 kg/m.  Wt Readings from Last 3 Encounters:  06/10/21 (!) 307 lb 3.2 oz (139.3 kg)  05/18/21 280 lb 6.8 oz (127.2 kg)  05/15/21 (!) 300 lb 4.3 oz (136.2 kg)    General: Patient appears comfortable at rest. Neck: Supple, no elevated JVP or carotid bruits, no thyromegaly. Lungs:  Clear to auscultation, nonlabored breathing at rest. Cardiac: Regular rate and rhythm, no S3 or  significant systolic murmur, no pericardial rub. Extremities: Mild non-pitting edema, distal pulses 2+. Skin: Warm and dry. Musculoskeletal: No kyphosis. Neuropsychiatric: Alert and oriented x3, affect grossly appropriate.  ECG:  EKG on 07/03/2019 showed sinus tachycardia with a rate of 106 left axis deviation, consider left anterior fascicular block.  Probable anterior septal infarct, old  Recent Labwork: 05/17/2021: B Natriuretic Peptide 199.0 05/18/2021: ALT 16; ALT TEST CANCELLED PER RN; AST 13; AST TEST CANCELLED PER RN; BUN 65; BUN TEST CANCELLED PER RN; Creatinine, Ser 8.66; Creatinine, Ser TEST CANCELLED PER RN; Hemoglobin 9.8; Hemoglobin TEST CANCELLED PER RN; Magnesium 2.3; Magnesium TEST CANCELLED PER RN; Platelets 265; Platelets TEST CANCELLED PER RN; Potassium 4.1; Potassium TEST CANCELLED PER RN; Sodium 134; Sodium TEST CANCELLED PER RN     Component Value Date/Time   CHOL 100 05/18/2021 1147   CHOL 96 05/18/2021 1147   TRIG 131 05/18/2021 1147   TRIG 128 05/18/2021 1147   HDL 40 (L) 05/18/2021 1147   HDL 38 (L) 05/18/2021 1147   CHOLHDL 2.5 05/18/2021 1147   CHOLHDL 2.5 05/18/2021 1147   VLDL 26 05/18/2021 1147   VLDL 26 05/18/2021 1147   LDLCALC 34 05/18/2021 1147   LDLCALC 32 05/18/2021 1147    Other Studies Reviewed Today:   Echocardiogram 05/15/2021  1. Left ventricular ejection fraction, by estimation, is 55 to 60%. The left ventricle has normal function. The left ventricle demonstrates regional wall motion abnormalities (see scoring diagram/findings for description). There is moderate left ventricular hypertrophy. Left ventricular diastolic parameters were normal. 2. Right ventricular systolic function is normal. The right ventricular size is normal. Tricuspid regurgitation signal is inadequate for assessing PA pressure. 3. There is a trivial pericardial effusion  posterior to the left ventricle. 4. The mitral valve is grossly normal. Trivial mitral valve regurgitation. 5. The aortic valve is tricuspid. Aortic valve regurgitation is not visualized. 6. Aortic dilatation noted. There is moderate dilatation of the aortic root, measuring 44 mm. 7. The inferior vena cava is normal in size with greater than 50% respiratory variability, suggesting right atrial pressure of 3 mmHg.   Echocardiogram 06/23/2019  1. The left ventricle has low normal systolic function, with an ejection  fraction of 50-55%. The cavity size was normal. There is mildly increased  left ventricular wall thickness. Left ventricular diastolic Doppler  parameters are indeterminate.   2. The right ventricle has normal systolic function. The cavity was  normal. There is no increase in right ventricular wall thickness.   3. The aortic valve is tricuspid. Mild thickening of the aortic valve.   4. There is dilatation of the aortic root measuring 40 mm.   Assessment and Plan:  1. NSTEMI (non-ST elevated myocardial infarction) ([place])   2. Chronic diastolic heart failure ([place])   3. ESRD (end stage renal disease) on dialysis ([place])   4. Essential hypertension   5. Suspected sleep apnea    NSTEMI He was recently admitted for acute shortness of breath with elevated troponins.  Diagnosis was NSTEMI.  Cardiology recommended cardiac catheterization.  Patiently ultimately refused to undergo cardiac catheterization and signed out AMA.  He stated he would follow-up outpatient.  Troponins were [id number].  I discussed the cardiologist's recommendation for cardiac catheterization.  Patient is now willing to undergo cardiac catheterization.  I discussed the risk and benefits at length with the patient and his wife.  He is willing to proceed.  Wife is asking for cardiac catheterization either on a Wednesday or Thursday.  Patient has  scheduled left heart cath dx: NSTEMI, chest pain, SOB, elevated troponin  Wednesday 06/12/2021 @10 :00 am arrive at 8:00 am with Dr. Tamala Julian   2. Chronic diastolic heart failure (HCC)  Recent echocardiogram 05/15/2021 EF 55 to 60%.  LV demonstrates regional wall motion abnormalities.  Moderate LVH, trivial pericardial effusion posterior to LV.  Trivial MR.  Continue furosemide 80 mg daily.  Coreg 25 mg p.o. twice daily  3. ESRD (end stage renal disease) on dialysis [facility]) Continues on dialysis Tuesday Thursdays and Saturdays.  He has a functioning AV fistula on his left upper extremity with positive thrill and bruit.  States he continues to take his blood pressure medications prior to dialysis due to labile blood pressures.  4. Essential hypertension Blood pressure 138/64  Continue amlodipine 5 mg daily, clonidine 0.3 mg 3 times daily, Coreg 25 mg p.o. twice daily.  5. Suspected sleep apnea  Refer to [facility] pulmonology clinic to evaluate for sleep apnea.  6.  Hyperlipidemia Continue Zetia 10 mg daily, atorvastatin 80 mg daily.  Medication Adjustments/Labs and Tests Ordered: Current medicines are reviewed at length with the patient today.  Concerns regarding medicines are outlined above.   Disposition: Follow-up with Dr. Harl Bowie or APP 1 month after cardiac catheterization  Signed, Levell July, NP 06/10/2021 8:54 AM    [facility] [place], [place], [place] [address] Phone: [phone]; Fax: [phone]

## 2021-06-09 NOTE — H&P (View-Only) (Signed)
Cardiology Office Note  Date: 06/10/2021   ID: Kelly Key, DOB [birth year], MRN [id number]  PCP:  [name], MD  Cardiologist:  Carlyle Dolly, MD Electrophysiologist:  None   Chief Complaint: Hospital follow up NSTEMI  History of Present Illness: Kelly Key is a 50 y.o. male with a history of chronic diastolic heart failure, ESRD, hypertension  Echo 03/2018 LVEF 60 to 65%, no WMA's, indeterminate diastolic function. Echo 06/16/2019 LVEF 50 to 55%, indeterminate diastolic function.  Last encounter 08/29/2019 with Dr. Harl Key via telemedicine visit.  Had no recent SOB or DOE.  Fluid management per HD.  Was still making some urine.  Dry weight was 286.  Admission on 07/17/2019 prolonged rehab stay after discharge.  Admitted with anasarca and started on dialysis.  Dialysis on Tuesday, Thursday, Saturday, no recent lower extremity edema.  Blood pressures on non-HD days 130s over 90s.  Notes current symptoms with chronic diastolic heart failure.  HD per nephrology.  Reasonable control given issues with labile blood pressures on HD days  He was previously here for 69-monthfollow-up.  Was continuing with dialysis on Tuesdays Thursdays and Saturdays.  Continues with labile blood pressures.  He was taking his blood pressure medicines prior to dialysis on dialysis days because if he does not his blood pressure is significantly elevated.  He was having issues with ED and may see a doctor in the future to explore medication to help.  He had tried Viagra and Cialis in the past which do not appear to work anymore.  He believed it was associated with his blood pressure medication.  He complained of issues with stopping breathing sometimes at home.  Complained of significant snoring and excessive daytime sleepiness.  He denies any significant anginal or exertional symptoms, palpitations or arrhythmias, orthostatic symptoms other than when then during dialysis.  He was trying to lose weight.  He had been  placed on the kidney list for possible kidney transplant in the future.  [facility].  He had recently been discharged on May 16, 2021 after being admitted for shortness of breath.  He became acutely short of breath on May 17, 2021.  Dyspnea was worse with exertion improved with rest.  He admitted to orthopnea.  Troponins were elevated but with flat trend at 112-100.  Chest x-ray showed vascular congestion.  EKG was sinus rhythm QTC of 497 with diffuse T wave inversions.  Cardiology [facility] ED [facility]  Diagnosis was NSTEMI.  He was admitted for further management.  Cardiology recommended cardiac catheterization.  Patient wanted to be discharged and signed out AMA.  .Plans were to follow-up with cardiology as an outpatient.  He was continuing hemodialysis and Lasix for fluid management for diastolic heart failure.  He is here today for follow-up after recent hospital stay for diagnosis of NSTEMI.  He had signed out AMA.  Cardiology had recommended he undergo a cardiac catheterization.  He presents today stating he is willing to undergo cardiac catheterization.  He currently denies any anginal or exertional symptoms.  We spoke about his elevated enzymes and reasons for recommendation for cardiac catheterization given his multiple risk factors.  I thoroughly discussed risk and benefits with he and his wife.  He is willing to proceed with cardiac catheterization per cardiologist recommendations.  He currently denies any SOB or DOE.  No  orthostatic symptoms, CVA or TIA-like symptoms, PND, orthopnea.  His weight is up but he states it fluctuates.  His discharge weight was around 279 pounds from recent hospital visit.  Today's weight is 307.  He is scheduled for dialysis tomorrow.  His wife would like for him to have the cardiac catheterization either on a Wednesday or  Thursday per her request.    Past Medical History:  Diagnosis Date   Acute diastolic CHF (congestive heart failure) ([place]) 06/11/2012   EF 50-55% [facility])   Chronic kidney disease    COPD (chronic obstructive pulmonary disease) (HCC)    Diabetes mellitus    A1c 11.5 (06/11/2012).   Dyspnea    Gout    Hepatic steatosis 06/11/2012   Elevated LFTs   Hyperlipemia    Malignant hypertension    Microcytic anemia 06/12/2012   Obesity     Past Surgical History:  Procedure Laterality Date   AV FISTULA PLACEMENT Left 11/11/2019   Procedure: ARTERIOVENOUS (AV) FISTULA CREATION LEFT BRACHIOCEPHALIC ARM;  Surgeon: Serafina Mitchell, MD;  Location: [facility] OR;  Service: Vascular;  Laterality: Left;   IR FLUORO GUIDE CV LINE RIGHT  07/08/2019   IR REMOVAL TUN CV CATH W/O FL  03/23/2020   IR US GUIDE VASC ACCESS RIGHT  07/08/2019   NO PAST SURGERIES     VASCULAR SURGERY      Current Outpatient Medications  Medication Sig Dispense Refill   acetaminophen (TYLENOL) 500 MG tablet Take 1,000 mg by mouth every 6 (six) hours as needed for moderate pain or headache.     albuterol (VENTOLIN HFA) 108 (90 Base) MCG/ACT inhaler Inhale 2 puffs into the lungs every 4 (four) hours as needed for wheezing or shortness of breath.      allopurinol (ZYLOPRIM) 100 MG tablet Take 2 tablets (200 mg total) by mouth daily. 30 tablet 0   amLODipine (NORVASC) 10 MG tablet Take 0.5 tablets (5 mg total) by mouth daily. 30 tablet 1   aspirin EC 81 MG EC tablet Take 1 tablet (81 mg total) by mouth daily. 30 tablet 0   atorvastatin (LIPITOR) 80 MG tablet Take 1 tablet (80 mg total) by mouth daily. 90 tablet 3   Calcium Carbonate Antacid (CALCIUM CARBONATE, DOSED IN MG ELEMENTAL CALCIUM,) 1250 MG/5ML SUSP Take 5 mLs (500 mg of elemental calcium total) by mouth every 6 (six) hours as needed for indigestion. 450 mL    carvedilol (COREG) 25 MG tablet Take 1 tablet (25 mg total) by mouth 2 (two) times daily with a meal. 60 tablet 0    cloNIDine (CATAPRES) 0.3 MG tablet Take 0.3 mg by mouth 3 (three) times daily.     diclofenac sodium (VOLTAREN) 1 % GEL Apply 2 g topically 4 (four) times daily. (Patient taking differently: Apply 2 g topically 4 (four) times daily as needed (pain).)     ezetimibe (ZETIA) 10 MG tablet Take 10 mg by mouth every evening.     fluticasone (FLONASE) 50 MCG/ACT nasal spray Place 1 spray into both nostrils daily as needed for allergies or rhinitis.     furosemide (LASIX) 80 MG tablet Take 1 tablet (80 mg total) by mouth daily. 30 tablet 1   gabapentin (NEURONTIN) 100 MG capsule Take 1 capsule (100 mg total) by mouth 3 (three) times daily. 90 capsule 0   glucose blood (ACCU-CHEK AVIVA PLUS) test strip Use as instructed TO CHECK BLOOD GLUCOSE THREE TIMES DAILY 200 each 3   HYDROcodone-acetaminophen (NORCO/VICODIN) 5-325  MG tablet Take 1 tablet by mouth every 6 (six) hours as needed for severe pain. 10 tablet 0   insulin detemir (LEVEMIR FLEXTOUCH) 100 UNIT/ML FlexPen Inject 50 Units into the skin at bedtime. 15 mL 2   Insulin Pen Needle (B-D ULTRAFINE III SHORT PEN) 31G X 8 MM MISC 1 each by Does not apply route as directed. 100 each 3   linaclotide (LINZESS) 145 MCG CAPS capsule Take 1 capsule (145 mcg total) by mouth daily before breakfast. 30 capsule 0   multivitamin (RENA-VIT) TABS tablet Take 1 tablet by mouth at bedtime. 30 tablet 0   Na Sulfate-K Sulfate-Mg Sulf (SUPREP BOWEL PREP KIT) 17.5-3.13-1.6 GM/177ML SOLN Take 1 kit by mouth as directed. 354 mL 0   NOVOLOG FLEXPEN 100 UNIT/ML FlexPen INJECT 8 TO 14 UNITS SUBCUTANEOUSLY THREE TIMES DAILY WITH MEALS (Patient taking differently: Inject 8 to 14 subcutaneously 3 times a day after meals sliding scale) 15 mL 2   oxyCODONE-acetaminophen (PERCOCET) 5-325 MG tablet Take 1-2 tablets by mouth every 6 (six) hours as needed. 12 tablet 0   OZEMPIC, 0.25 OR 0.5 MG/DOSE, 2 MG/1.5ML SOPN Inject 0.25 mg into the skin once a week.     sevelamer carbonate  (RENVELA) 800 MG tablet Take 3 tablets (2,400 mg total) by mouth 3 (three) times daily with meals. 270 tablet 0   SSD 1 % cream Apply topically.     valACYclovir (VALTREX) 1000 MG tablet Take 1 tablet (1,000 mg total) by mouth 3 (three) times daily. 21 tablet 0   amoxicillin (AMOXIL) 500 MG capsule Take 500 mg by mouth 2 (two) times daily. (Patient not taking: Reported on 06/10/2021)     predniSONE (DELTASONE) 10 MG tablet Take 2 tablets (20 mg total) by mouth 2 (two) times daily. (Patient not taking: Reported on 06/10/2021) 20 tablet 0   Current Facility-Administered Medications  Medication Dose Route Frequency Provider Last Rate Last Admin   0.9 %  sodium chloride infusion   Intravenous PRN Marcello Fennel, PA-C       bebtelovimab EUA injection SOLN 175 mg  175 mg Intravenous Once Marcello Fennel, PA-C       Allergies:  Lisinopril and Hydralazine   Social History: The patient  reports that he has been smoking cigarettes and cigars. He has never used smokeless tobacco. He reports that he does not currently use alcohol. He reports that he does not use drugs.   Family History: The patient's family history includes Asthma in his mother; Diabetes in his father, sister, and sister; Gout in his mother; Heart failure in his father; Hypertension in his brother; Pancreatic cancer in his brother.   ROS:  Please see the history of present illness. Otherwise, complete review of systems is positive for none.  All other systems are reviewed and negative.   Physical Exam: VS:  BP 138/64   Pulse (!) 52   Ht 5' 7"  (1.702 m)   Wt (!) 307 lb 3.2 oz (139.3 kg)   SpO2 98%   BMI 48.11 kg/m , BMI Body mass index is 48.11 kg/m.  Wt Readings from Last 3 Encounters:  06/10/21 (!) 307 lb 3.2 oz (139.3 kg)  05/18/21 280 lb 6.8 oz (127.2 kg)  05/15/21 (!) 300 lb 4.3 oz (136.2 kg)    General: Patient appears comfortable at rest. Neck: Supple, no elevated JVP or carotid bruits, no thyromegaly. Lungs:  Clear to auscultation, nonlabored breathing at rest. Cardiac: Regular rate and rhythm, no S3 or  significant systolic murmur, no pericardial rub. Extremities: Mild non-pitting edema, distal pulses 2+. Skin: Warm and dry. Musculoskeletal: No kyphosis. Neuropsychiatric: Alert and oriented x3, affect grossly appropriate.  ECG:  EKG on 07/03/2019 showed sinus tachycardia with a rate of 106 left axis deviation, consider left anterior fascicular block.  Probable anterior septal infarct, old  Recent Labwork: 05/17/2021: B Natriuretic Peptide 199.0 05/18/2021: ALT 16; ALT TEST CANCELLED PER RN; AST 13; AST TEST CANCELLED PER RN; BUN 65; BUN TEST CANCELLED PER RN; Creatinine, Ser 8.66; Creatinine, Ser TEST CANCELLED PER RN; Hemoglobin 9.8; Hemoglobin TEST CANCELLED PER RN; Magnesium 2.3; Magnesium TEST CANCELLED PER RN; Platelets 265; Platelets TEST CANCELLED PER RN; Potassium 4.1; Potassium TEST CANCELLED PER RN; Sodium 134; Sodium TEST CANCELLED PER RN     Component Value Date/Time   CHOL 100 05/18/2021 1147   CHOL 96 05/18/2021 1147   TRIG 131 05/18/2021 1147   TRIG 128 05/18/2021 1147   HDL 40 (L) 05/18/2021 1147   HDL 38 (L) 05/18/2021 1147   CHOLHDL 2.5 05/18/2021 1147   CHOLHDL 2.5 05/18/2021 1147   VLDL 26 05/18/2021 1147   VLDL 26 05/18/2021 1147   LDLCALC 34 05/18/2021 1147   LDLCALC 32 05/18/2021 1147    Other Studies Reviewed Today:   Echocardiogram 05/15/2021  1. Left ventricular ejection fraction, by estimation, is 55 to 60%. The left ventricle has normal function. The left ventricle demonstrates regional wall motion abnormalities (see scoring diagram/findings for description). There is moderate left ventricular hypertrophy. Left ventricular diastolic parameters were normal. 2. Right ventricular systolic function is normal. The right ventricular size is normal. Tricuspid regurgitation signal is inadequate for assessing PA pressure. 3. There is a trivial pericardial effusion  posterior to the left ventricle. 4. The mitral valve is grossly normal. Trivial mitral valve regurgitation. 5. The aortic valve is tricuspid. Aortic valve regurgitation is not visualized. 6. Aortic dilatation noted. There is moderate dilatation of the aortic root, measuring 44 mm. 7. The inferior vena cava is normal in size with greater than 50% respiratory variability, suggesting right atrial pressure of 3 mmHg.   Echocardiogram 06/23/2019  1. The left ventricle has low normal systolic function, with an ejection  fraction of 50-55%. The cavity size was normal. There is mildly increased  left ventricular wall thickness. Left ventricular diastolic Doppler  parameters are indeterminate.   2. The right ventricle has normal systolic function. The cavity was  normal. There is no increase in right ventricular wall thickness.   3. The aortic valve is tricuspid. Mild thickening of the aortic valve.   4. There is dilatation of the aortic root measuring 40 mm.   Assessment and Plan:  1. NSTEMI (non-ST elevated myocardial infarction) ([place])   2. Chronic diastolic heart failure ([place])   3. ESRD (end stage renal disease) on dialysis ([place])   4. Essential hypertension   5. Suspected sleep apnea    NSTEMI He was recently admitted for acute shortness of breath with elevated troponins.  Diagnosis was NSTEMI.  Cardiology recommended cardiac catheterization.  Patiently ultimately refused to undergo cardiac catheterization and signed out AMA.  He stated he would follow-up outpatient.  Troponins were [id number].  I discussed the cardiologist's recommendation for cardiac catheterization.  Patient is now willing to undergo cardiac catheterization.  I discussed the risk and benefits at length with the patient and his wife.  He is willing to proceed.  Wife is asking for cardiac catheterization either on a Wednesday or Thursday.  Patient has  scheduled left heart cath dx: NSTEMI, chest pain, SOB, elevated troponin  Wednesday 06/12/2021 @10 :00 am arrive at 8:00 am with Dr. Tamala Julian   2. Chronic diastolic heart failure (HCC)  Recent echocardiogram 05/15/2021 EF 55 to 60%.  LV demonstrates regional wall motion abnormalities.  Moderate LVH, trivial pericardial effusion posterior to LV.  Trivial MR.  Continue furosemide 80 mg daily.  Coreg 25 mg p.o. twice daily  3. ESRD (end stage renal disease) on dialysis [facility]) Continues on dialysis Tuesday Thursdays and Saturdays.  He has a functioning AV fistula on his left upper extremity with positive thrill and bruit.  States he continues to take his blood pressure medications prior to dialysis due to labile blood pressures.  4. Essential hypertension Blood pressure 138/64  Continue amlodipine 5 mg daily, clonidine 0.3 mg 3 times daily, Coreg 25 mg p.o. twice daily.  5. Suspected sleep apnea  Refer to [facility] pulmonology clinic to evaluate for sleep apnea.  6.  Hyperlipidemia Continue Zetia 10 mg daily, atorvastatin 80 mg daily.  Medication Adjustments/Labs and Tests Ordered: Current medicines are reviewed at length with the patient today.  Concerns regarding medicines are outlined above.   Disposition: Follow-up with Dr. Harl Key or APP 1 month after cardiac catheterization  Signed, Levell July, NP 06/10/2021 8:54 AM    [facility] at [place], [place], [place] [address] Phone: [phone]; Fax: [phone]

## 2021-06-10 ENCOUNTER — Encounter: Payer: Self-pay | Admitting: Family Medicine

## 2021-06-10 ENCOUNTER — Other Ambulatory Visit: Payer: Self-pay

## 2021-06-10 ENCOUNTER — Ambulatory Visit (INDEPENDENT_AMBULATORY_CARE_PROVIDER_SITE_OTHER): Payer: Medicare Other | Admitting: Family Medicine

## 2021-06-10 VITALS — BP 138/64 | HR 52 | Ht 67.0 in | Wt 307.2 lb

## 2021-06-10 DIAGNOSIS — I5032 Chronic diastolic (congestive) heart failure: Secondary | ICD-10-CM | POA: Diagnosis not present

## 2021-06-10 DIAGNOSIS — Z01810 Encounter for preprocedural cardiovascular examination: Secondary | ICD-10-CM

## 2021-06-10 DIAGNOSIS — I214 Non-ST elevation (NSTEMI) myocardial infarction: Secondary | ICD-10-CM

## 2021-06-10 DIAGNOSIS — R29818 Other symptoms and signs involving the nervous system: Secondary | ICD-10-CM

## 2021-06-10 DIAGNOSIS — Z992 Dependence on renal dialysis: Secondary | ICD-10-CM

## 2021-06-10 DIAGNOSIS — N186 End stage renal disease: Secondary | ICD-10-CM | POA: Diagnosis not present

## 2021-06-10 DIAGNOSIS — Z01818 Encounter for other preprocedural examination: Secondary | ICD-10-CM | POA: Diagnosis not present

## 2021-06-10 DIAGNOSIS — I10 Essential (primary) hypertension: Secondary | ICD-10-CM

## 2021-06-10 NOTE — Patient Instructions (Addendum)
Medication Instructions:  Your physician recommends that you continue on your current medications as directed. Please refer to the Current Medication list given to you today.  Labwork: BMET & CBC today at [facility] or [facility] Lab  Testing/Procedures: Your physician has requested that you have a cardiac catheterization. Cardiac catheterization is used to diagnose and/or treat various heart conditions. Doctors may recommend this procedure for a number of different reasons. The most common reason is to evaluate chest pain. Chest pain can be a symptom of coronary artery disease (CAD), and cardiac catheterization can show whether plaque is narrowing or blocking your heart's arteries. This procedure is also used to evaluate the valves, as well as measure the blood flow and oxygen levels in different parts of your heart. For further information please visit [email or web address]. Please follow instruction sheet, as given.  Follow-Up: Your physician recommends that you schedule a follow-up appointment in: 1 month  Any Other Special Instructions Will Be Listed Below (If Applicable). You have been referred to [facility] Pulmonology  If you need a refill on your cardiac medications before your next appointment, please call your pharmacy.   [place] [address] Dept: [phone] Loc: [phone]  Kelly Key  06/10/2021  You are scheduled for a Cardiac Catheterization on Wednesday, August 17 with Dr. Daneen Schick.  1. Please arrive at the [facility] (Main Entrance A) at [facility]: [address] [place], [place] [address] at 8:00 AM (This time is two hours before your procedure to ensure your preparation). Free valet parking service is available.   Special note: Every effort is made to have your procedure done on time. Please understand that emergencies  sometimes delay scheduled procedures.  2. Diet: Do not eat solid foods after midnight.  The patient may have clear liquids until 5am upon the day of the procedure.  3. Labs: You will need to have blood drawn on Monday, August 15 at [facility] or [place] do not need to be fasting.  4. Medication instructions in preparation for your procedure:   Contrast Allergy: No  Stop taking, Lasix (Furosemide)  Tuesday, August 16, and hold morning of heart cath  Take only 25 units of insulin the night before your procedure. Do not take any insulin on the day of the procedure.  Levemir and Novolog  On the morning of your procedure, take your Aspirin 81 mg and any morning medicines NOT listed above.  You may use sips of water.  5. Plan for one night stay--bring personal belongings. 6. Bring a current list of your medications and current insurance cards. 7. You MUST have a responsible person to drive you home. 8. Someone MUST be with you the first 24 hours after you arrive home or your discharge will be delayed. 9. Please wear clothes that are easy to get on and off and wear slip-on shoes.  Thank you for allowing [place] to care for you!   -- [facility]

## 2021-06-11 ENCOUNTER — Telehealth: Payer: Self-pay | Admitting: *Deleted

## 2021-06-11 LAB — CBC
Hematocrit: 31.5 % — ABNORMAL LOW (ref 37.5–51.0)
Hemoglobin: 10.5 g/dL — ABNORMAL LOW (ref 13.0–17.7)
MCH: 29.4 pg (ref 26.6–33.0)
MCHC: 33.3 g/dL (ref 31.5–35.7)
MCV: 88 fL (ref 79–97)
Platelets: 310 10*3/uL (ref 150–450)
RBC: 3.57 x10E6/uL — ABNORMAL LOW (ref 4.14–5.80)
RDW: 14.8 % (ref 11.6–15.4)
WBC: 7.3 10*3/uL (ref 3.4–10.8)

## 2021-06-11 LAB — BASIC METABOLIC PANEL
BUN/Creatinine Ratio: 7 — ABNORMAL LOW (ref 9–20)
BUN: 66 mg/dL — ABNORMAL HIGH (ref 6–24)
CO2: 24 mmol/L (ref 20–29)
Calcium: 9 mg/dL (ref 8.7–10.2)
Chloride: 95 mmol/L — ABNORMAL LOW (ref 96–106)
Creatinine, Ser: 9.41 mg/dL — ABNORMAL HIGH (ref 0.76–1.27)
Glucose: 97 mg/dL (ref 65–99)
Potassium: 4.9 mmol/L (ref 3.5–5.2)
Sodium: 137 mmol/L (ref 134–144)
eGFR: 6 mL/min/{1.73_m2} — ABNORMAL LOW (ref >59)

## 2021-06-11 NOTE — Telephone Encounter (Addendum)
Cardiac catheterization scheduled at [facility] for: Wednesday June 12, 2021 [facility] Main Entrance A [facility]) at: 8 AM Tu-Th-Sat dialysis.   No solid food after midnight prior to cath, clear liquids until 5 AM day of procedure.  Medication instructions: Hold: -Insulin-AM of procedure/1/2 usual Insulin HS prior to procedure -Lasix-AM of procedure  Except hold medications morning medications can be taken pre-cath with sips of water including aspirin 81 mg.    Confirmed patient has responsible adult to drive home post procedure and be with patient first 24 hours after arriving home.  Patients are allowed one visitor in the waiting room during the time they are at the hospital for their procedure. Both patient and visitor must wear a mask once they enter the hospital.   Patient reports does not currently have any symptoms concerning for COVID-19 and no household members with COVID-19 like illness.      Reviewed procedure/mask/visitor instructions with [name] (DPR).

## 2021-06-12 ENCOUNTER — Encounter (HOSPITAL_COMMUNITY): Payer: Self-pay | Admitting: Interventional Cardiology

## 2021-06-12 ENCOUNTER — Other Ambulatory Visit: Payer: Self-pay

## 2021-06-12 ENCOUNTER — Encounter (HOSPITAL_COMMUNITY)
Admission: RE | Disposition: A | Discharge status: Home / Self Care | Payer: Self-pay | Source: Home / Self Care | Attending: Interventional Cardiology

## 2021-06-12 ENCOUNTER — Ambulatory Visit (HOSPITAL_COMMUNITY)
Admission: RE | Admit: 2021-06-12 | Discharge: 2021-06-12 | Disposition: A | Discharge status: Home / Self Care | Payer: Medicare Other | Attending: Interventional Cardiology | Admitting: Interventional Cardiology

## 2021-06-12 DIAGNOSIS — I214 Non-ST elevation (NSTEMI) myocardial infarction: Secondary | ICD-10-CM | POA: Diagnosis not present

## 2021-06-12 DIAGNOSIS — Z7982 Long term (current) use of aspirin: Secondary | ICD-10-CM | POA: Diagnosis not present

## 2021-06-12 DIAGNOSIS — IMO0002 Reserved for concepts with insufficient information to code with codable children: Secondary | ICD-10-CM | POA: Diagnosis present

## 2021-06-12 DIAGNOSIS — Z888 Allergy status to other drugs, medicaments and biological substances status: Secondary | ICD-10-CM | POA: Insufficient documentation

## 2021-06-12 DIAGNOSIS — I2510 Atherosclerotic heart disease of native coronary artery without angina pectoris: Secondary | ICD-10-CM | POA: Diagnosis not present

## 2021-06-12 DIAGNOSIS — I5032 Chronic diastolic (congestive) heart failure: Secondary | ICD-10-CM | POA: Diagnosis not present

## 2021-06-12 DIAGNOSIS — E785 Hyperlipidemia, unspecified: Secondary | ICD-10-CM | POA: Diagnosis present

## 2021-06-12 DIAGNOSIS — E1122 Type 2 diabetes mellitus with diabetic chronic kidney disease: Secondary | ICD-10-CM | POA: Insufficient documentation

## 2021-06-12 DIAGNOSIS — F17210 Nicotine dependence, cigarettes, uncomplicated: Secondary | ICD-10-CM | POA: Diagnosis not present

## 2021-06-12 DIAGNOSIS — Z794 Long term (current) use of insulin: Secondary | ICD-10-CM | POA: Insufficient documentation

## 2021-06-12 DIAGNOSIS — Z79899 Other long term (current) drug therapy: Secondary | ICD-10-CM | POA: Insufficient documentation

## 2021-06-12 DIAGNOSIS — E1165 Type 2 diabetes mellitus with hyperglycemia: Secondary | ICD-10-CM | POA: Diagnosis present

## 2021-06-12 DIAGNOSIS — E119 Type 2 diabetes mellitus without complications: Secondary | ICD-10-CM | POA: Diagnosis present

## 2021-06-12 DIAGNOSIS — N186 End stage renal disease: Secondary | ICD-10-CM | POA: Diagnosis not present

## 2021-06-12 DIAGNOSIS — I132 Hypertensive heart and chronic kidney disease with heart failure and with stage 5 chronic kidney disease, or end stage renal disease: Secondary | ICD-10-CM | POA: Insufficient documentation

## 2021-06-12 DIAGNOSIS — E669 Obesity, unspecified: Secondary | ICD-10-CM | POA: Diagnosis present

## 2021-06-12 DIAGNOSIS — I5042 Chronic combined systolic (congestive) and diastolic (congestive) heart failure: Secondary | ICD-10-CM | POA: Diagnosis present

## 2021-06-12 HISTORY — PX: LEFT HEART CATH AND CORONARY ANGIOGRAPHY: CATH118249

## 2021-06-12 LAB — GLUCOSE, CAPILLARY
Glucose-Capillary: 124 mg/dL — ABNORMAL HIGH (ref 70–99)
Glucose-Capillary: 126 mg/dL — ABNORMAL HIGH (ref 70–99)

## 2021-06-12 SURGERY — LEFT HEART CATH AND CORONARY ANGIOGRAPHY
Anesthesia: LOCAL

## 2021-06-12 MED ORDER — SODIUM CHLORIDE 0.9% FLUSH: 3.0000 mL | Freq: Two times a day (BID) | INTRAVENOUS | Status: DC

## 2021-06-12 MED ORDER — LIDOCAINE HCL (PF) 1 % IJ SOLN
INTRAMUSCULAR | Status: DC | PRN
  Administered 2021-06-12: 20 mL

## 2021-06-12 MED ORDER — ATORVASTATIN CALCIUM 80 MG PO TABS
80.0000 mg | ORAL_TABLET | Freq: Every day | ORAL | Status: DC
  Filled 2021-06-12: qty 1

## 2021-06-12 MED ORDER — FENTANYL CITRATE (PF) 100 MCG/2ML IJ SOLN
INTRAMUSCULAR | Status: DC | PRN
  Administered 2021-06-12: 25 ug via INTRAVENOUS

## 2021-06-12 MED ORDER — SODIUM CHLORIDE 0.9% FLUSH: 3.0000 mL | INTRAVENOUS | Status: DC | PRN

## 2021-06-12 MED ORDER — MIDAZOLAM HCL 2 MG/2ML IJ SOLN
INTRAMUSCULAR | Status: AC
  Filled 2021-06-12: qty 2

## 2021-06-12 MED ORDER — HEPARIN (PORCINE) IN NACL 1000-0.9 UT/500ML-% IV SOLN
INTRAVENOUS | Status: DC | PRN
  Administered 2021-06-12 (×2): 500 mL

## 2021-06-12 MED ORDER — ASPIRIN 81 MG PO CHEW: 81.0000 mg | CHEWABLE_TABLET | Freq: Every day | ORAL | Status: DC

## 2021-06-12 MED ORDER — SODIUM CHLORIDE 0.9 % IV SOLN: 250.0000 mL | INTRAVENOUS | Status: DC | PRN

## 2021-06-12 MED ORDER — LABETALOL HCL 5 MG/ML IV SOLN: 10.0000 mg | INTRAVENOUS | Status: DC | PRN

## 2021-06-12 MED ORDER — ONDANSETRON HCL 4 MG/2ML IJ SOLN: 4.0000 mg | Freq: Four times a day (QID) | INTRAMUSCULAR | Status: DC | PRN

## 2021-06-12 MED ORDER — HEPARIN (PORCINE) IN NACL 1000-0.9 UT/500ML-% IV SOLN
INTRAVENOUS | Status: AC
  Filled 2021-06-12: qty 1000

## 2021-06-12 MED ORDER — FENTANYL CITRATE (PF) 100 MCG/2ML IJ SOLN
INTRAMUSCULAR | Status: AC
  Filled 2021-06-12: qty 2

## 2021-06-12 MED ORDER — OXYCODONE HCL 5 MG PO TABS: 5.0000 mg | ORAL_TABLET | ORAL | Status: DC | PRN

## 2021-06-12 MED ORDER — ACETAMINOPHEN 325 MG PO TABS: 650.0000 mg | ORAL_TABLET | ORAL | Status: DC | PRN

## 2021-06-12 MED ORDER — LABETALOL HCL 5 MG/ML IV SOLN
INTRAVENOUS | Status: AC
  Filled 2021-06-12: qty 4

## 2021-06-12 MED ORDER — SODIUM CHLORIDE 0.9 % IV SOLN: INTRAVENOUS | Status: DC

## 2021-06-12 MED ORDER — ASPIRIN 81 MG PO CHEW: 81.0000 mg | CHEWABLE_TABLET | ORAL | Status: DC

## 2021-06-12 MED ORDER — MIDAZOLAM HCL 2 MG/2ML IJ SOLN
INTRAMUSCULAR | Status: DC | PRN
  Administered 2021-06-12: 1 mg via INTRAVENOUS

## 2021-06-12 MED ORDER — LIDOCAINE HCL (PF) 1 % IJ SOLN
INTRAMUSCULAR | Status: AC
  Filled 2021-06-12: qty 30

## 2021-06-12 MED ORDER — LABETALOL HCL 5 MG/ML IV SOLN
INTRAVENOUS | Status: DC | PRN
Start: 2021-06-12 — End: 2021-06-12
  Administered 2021-06-12: 10 mg via INTRAVENOUS

## 2021-06-12 SURGICAL SUPPLY — 12 items
CATH INFINITI 5FR JL4 (CATHETERS) ×1 IMPLANT
CATH INFINITI 5FR MPB2 (CATHETERS) ×1 IMPLANT
CATH INFINITI JR4 5F (CATHETERS) ×1 IMPLANT
CATH LAUNCHER 5F EBU3.5 (CATHETERS) ×1 IMPLANT
CATH LAUNCHER 6FR EBU 4 (CATHETERS) ×1 IMPLANT
CLOSURE MYNX CONTROL 5F (Vascular Products) ×1 IMPLANT
KIT HEART LEFT (KITS) ×2 IMPLANT
PACK CARDIAC CATHETERIZATION (CUSTOM PROCEDURE TRAY) ×2 IMPLANT
SHEATH PINNACLE 5F 10CM (SHEATH) ×1 IMPLANT
TRANSDUCER W/STOPCOCK (MISCELLANEOUS) ×2 IMPLANT
TUBING CIL FLEX 10 FLL-RA (TUBING) ×2 IMPLANT
WIRE EMERALD 3MM-J .035X150CM (WIRE) ×1 IMPLANT

## 2021-06-12 NOTE — Interval H&P Note (Signed)
Cath Lab Visit (complete for each Cath Lab visit)  Clinical Evaluation Leading to the Procedure:   ACS: No.  Non-ACS:    Anginal Classification: CCS III  Anti-ischemic medical therapy: Minimal Therapy (1 class of medications)  Non-Invasive Test Results: No non-invasive testing performed  Prior CABG: No previous CABG      History and Physical Interval Note:  06/12/2021 10:29 AM  Kelly Key  has presented today for surgery, with the diagnosis of nstemi.  The various methods of treatment have been discussed with the patient and family. After consideration of risks, benefits and other options for treatment, the patient has consented to  Procedure(s): LEFT HEART CATH AND CORONARY ANGIOGRAPHY (N/A) as a surgical intervention.  The patient's history has been reviewed, patient examined, no change in status, stable for surgery.  I have reviewed the patient's chart and labs.  Questions were answered to the patient's satisfaction.     Belva Crome III

## 2021-06-12 NOTE — CV Procedure (Signed)
50 to 70% mid RCA, tortuous. 56% ostial LAD with 25 to 30% distal left main.  Neither appear hemodynamically significant. Vessels otherwise widely patent.

## 2021-06-14 ENCOUNTER — Ambulatory Visit: Payer: Medicare Other | Admitting: Nurse Practitioner

## 2021-06-14 DIAGNOSIS — I10 Essential (primary) hypertension: Secondary | ICD-10-CM

## 2021-06-14 DIAGNOSIS — E782 Mixed hyperlipidemia: Secondary | ICD-10-CM

## 2021-06-14 DIAGNOSIS — E1122 Type 2 diabetes mellitus with diabetic chronic kidney disease: Secondary | ICD-10-CM

## 2021-07-08 ENCOUNTER — Telehealth: Payer: Self-pay

## 2021-07-08 NOTE — Telephone Encounter (Signed)
Pt. Calling to schedule a colonoscopy. [phone].

## 2021-07-08 NOTE — Telephone Encounter (Signed)
Returned patients call with the number that was provided. LVM to call office back.

## 2021-07-09 ENCOUNTER — Other Ambulatory Visit: Payer: Self-pay | Admitting: "Endocrinology

## 2021-07-14 NOTE — Progress Notes (Deleted)
Cardiology Office Note  Date: 07/14/2021   ID: Kelly Key, DOB [birth year], MRN [id number]  PCP:  Kelly Gravel, MD  Cardiologist:  Kelly Dolly, MD Electrophysiologist:  None   Chief Complaint: Hospital follow up NSTEMI  History of Present Illness: Kelly Key is a 50 y.o. male with a history of chronic diastolic heart failure, ESRD, hypertension  Echo 03/2018 LVEF 60 to 65%, no WMA's, indeterminate diastolic function. Echo 06/16/2019 LVEF 50 to 55%, indeterminate diastolic function.  Last encounter 08/29/2019 with Dr. Harl Key via telemedicine visit.  Had no recent SOB or DOE.  Fluid management per HD.  Was still making some urine.  Dry weight was 286.  Admission on 07/17/2019 prolonged rehab stay after discharge.  Admitted with anasarca and started on dialysis.  Dialysis on Tuesday, Thursday, Saturday, no recent lower extremity edema.  Blood pressures on non-HD days 130s over 90s.  Notes current symptoms with chronic diastolic heart failure.  HD per nephrology.  Reasonable control given issues with labile blood pressures on HD days  He was previously here for 6-monthfollow-up.  Was continuing with dialysis on Tuesdays Thursdays and Saturdays.  Continues with labile blood pressures.  He was taking his blood pressure medicines prior to dialysis on dialysis days because if he does not his blood pressure is significantly elevated.  He was having issues with ED and may see a doctor in the future to explore medication to help.  He had tried Viagra and Cialis in the past which do not appear to work anymore.  He believed it was associated with his blood pressure medication.  He complained of issues with stopping breathing sometimes at home.  Complained of significant snoring and excessive daytime sleepiness.  He denies any significant anginal or exertional symptoms, palpitations or arrhythmias, orthostatic symptoms other than when then during dialysis.  He was trying to lose weight.  He had been  placed on the kidney list for possible kidney transplant in the future.  [facility].  He had recently been discharged on May 16, 2021 after being admitted for shortness of breath.  He became acutely short of breath on May 17, 2021.  Dyspnea was worse with exertion improved with rest.  He admitted to orthopnea.  Troponins were elevated but with flat trend at 112-100.  Chest x-ray showed vascular congestion.  EKG was sinus rhythm QTC of 497 with diffuse T wave inversions.  [facility]iology [facility] ED [facility]  Diagnosis was NSTEMI.  He was admitted for further management.  Cardiology recommended cardiac catheterization.  Patient wanted to be discharged and signed out AMA.  .Plans were to follow-up with cardiology as an outpatient.  He was continuing hemodialysis and Lasix for fluid management for diastolic heart failure.  He is here today for follow-up after recent hospital stay for diagnosis of NSTEMI.  He had signed out AMA.  Cardiology had recommended he undergo a cardiac catheterization.  He presents today stating he is willing to undergo cardiac catheterization.  He currently denies any anginal or exertional symptoms.  We spoke about his elevated enzymes and reasons for recommendation for cardiac catheterization given his multiple risk factors.  I thoroughly discussed risk and benefits with he and his wife.  He is willing to proceed with cardiac catheterization per cardiologist recommendations.  He currently denies any SOB or DOE.  No  orthostatic symptoms, CVA or TIA-like symptoms, PND, orthopnea.  His weight is up but he states it fluctuates.  His discharge weight was around 279 pounds from recent hospital visit.  Today's weight is 307.  He is scheduled for dialysis tomorrow.  His wife would like for him to have the cardiac catheterization either on a Wednesday or  Thursday per her request.    Past Medical History:  Diagnosis Date   Acute diastolic CHF (congestive heart failure) ([place]) 06/11/2012   EF 50-55% [facility])   Chronic kidney disease    COPD (chronic obstructive pulmonary disease) (HCC)    Diabetes mellitus    A1c 11.5 (06/11/2012).   Dyspnea    Gout    Hepatic steatosis 06/11/2012   Elevated LFTs   Hyperlipemia    Malignant hypertension    Microcytic anemia 06/12/2012   Obesity     Past Surgical History:  Procedure Laterality Date   AV FISTULA PLACEMENT Left 11/11/2019   Procedure: ARTERIOVENOUS (AV) FISTULA CREATION LEFT BRACHIOCEPHALIC ARM;  Surgeon: Kelly Mitchell, MD;  Location: [facility] OR;  Service: Vascular;  Laterality: Left;   IR FLUORO GUIDE CV LINE RIGHT  07/08/2019   IR REMOVAL TUN CV CATH W/O FL  03/23/2020   IR US GUIDE VASC ACCESS RIGHT  07/08/2019   LEFT HEART CATH AND CORONARY ANGIOGRAPHY N/A 06/12/2021   Procedure: LEFT HEART CATH AND CORONARY ANGIOGRAPHY;  Surgeon: Kelly Crome, MD;  Location: [place] CV LAB;  Service: Cardiovascular;  Laterality: N/A;   NO PAST SURGERIES     VASCULAR SURGERY      Current Outpatient Medications  Medication Sig Dispense Refill   acetaminophen (TYLENOL) 500 MG tablet Take 1,000 mg by mouth every 6 (six) hours as needed for moderate pain or headache.     albuterol (VENTOLIN HFA) 108 (90 Base) MCG/ACT inhaler Inhale 2 puffs into the lungs every 4 (four) hours as needed for wheezing or shortness of breath.      allopurinol (ZYLOPRIM) 100 MG tablet Take 2 tablets (200 mg total) by mouth daily. 30 tablet 0   amLODipine (NORVASC) 10 MG tablet Take 0.5 tablets (5 mg total) by mouth daily. 30 tablet 1   aspirin EC 81 MG EC tablet Take 1 tablet (81 mg total) by mouth daily. 30 tablet 0   atorvastatin (LIPITOR) 80 MG tablet Take 1 tablet (80 mg total) by mouth daily. 90 tablet 3   Calcium Carbonate Antacid (CALCIUM CARBONATE, DOSED IN MG ELEMENTAL CALCIUM,) 1250 MG/5ML SUSP Take 5  mLs (500 mg of elemental calcium total) by mouth every 6 (six) hours as needed for indigestion. 450 mL    carvedilol (COREG) 25 MG tablet Take 1 tablet (25 mg total) by mouth 2 (two) times daily with a meal. 60 tablet 0   cloNIDine (CATAPRES) 0.3 MG tablet Take 0.3 mg by mouth 3 (three) times daily.     diclofenac sodium (VOLTAREN) 1 % GEL Apply 2 g topically 4 (four) times daily.     ezetimibe (ZETIA) 10 MG tablet Take 10 mg by mouth every evening.     fluticasone (FLONASE) 50 MCG/ACT nasal spray Place 1 spray into both nostrils daily as needed for allergies or rhinitis.     furosemide (LASIX) 80 MG tablet Take 1 tablet (80 mg total) by mouth daily. 30 tablet 1   gabapentin (NEURONTIN) 100 MG capsule Take 1 capsule (100 mg total) by mouth 3 (three) times daily. 90 capsule 0   glucose blood (  ACCU-CHEK AVIVA PLUS) test strip Use as instructed TO CHECK BLOOD GLUCOSE THREE TIMES DAILY 200 each 3   HYDROcodone-acetaminophen (NORCO/VICODIN) 5-325 MG tablet Take 1 tablet by mouth every 6 (six) hours as needed for severe pain. 10 tablet 0   Insulin Pen Needle (B-D ULTRAFINE III SHORT PEN) 31G X 8 MM MISC 1 each by Does not apply route as directed. 100 each 3   LEVEMIR FLEXTOUCH 100 UNIT/ML FlexTouch Pen INJECT 50 UNITS SUBCUTANEOUSLY AT BEDTIME 15 mL 0   linaclotide (LINZESS) 145 MCG CAPS capsule Take 1 capsule (145 mcg total) by mouth daily before breakfast. 30 capsule 0   multivitamin (RENA-VIT) TABS tablet Take 1 tablet by mouth at bedtime. 30 tablet 0   Na Sulfate-K Sulfate-Mg Sulf (SUPREP BOWEL PREP KIT) 17.5-3.13-1.6 GM/177ML SOLN Take 1 kit by mouth as directed. (Patient not taking: No sig reported) 354 mL 0   NOVOLOG FLEXPEN 100 UNIT/ML FlexPen INJECT 8 TO 14 UNITS SUBCUTANEOUSLY THREE TIMES DAILY WITH MEALS 15 mL 0   oxyCODONE-acetaminophen (PERCOCET) 5-325 MG tablet Take 1-2 tablets by mouth every 6 (six) hours as needed. (Patient not taking: No sig reported) 12 tablet 0   OZEMPIC, 0.25 OR 0.5  MG/DOSE, 2 MG/1.5ML SOPN Inject 0.25 mg into the skin every Friday.     predniSONE (DELTASONE) 10 MG tablet Take 2 tablets (20 mg total) by mouth 2 (two) times daily. (Patient not taking: Reported on 06/11/2021) 20 tablet 0   sevelamer carbonate (RENVELA) 800 MG tablet Take 3 tablets (2,400 mg total) by mouth 3 (three) times daily with meals. 270 tablet 0   valACYclovir (VALTREX) 1000 MG tablet Take 1 tablet (1,000 mg total) by mouth 3 (three) times daily. (Patient not taking: No sig reported) 21 tablet 0   Current Facility-Administered Medications  Medication Dose Route Frequency Provider Last Rate Last Admin   0.9 %  sodium chloride infusion   Intravenous PRN Marcello Fennel, PA-C       bebtelovimab EUA injection SOLN 175 mg  175 mg Intravenous Once Marcello Fennel, PA-C       Allergies:  Lisinopril and Hydralazine   Social History: The patient  reports that he has been smoking cigarettes and cigars. He has never used smokeless tobacco. He reports that he does not currently use alcohol. He reports that he does not use drugs.   Family History: The patient's family history includes Asthma in his mother; Diabetes in his father, sister, and sister; Gout in his mother; Heart failure in his father; Hypertension in his brother; Pancreatic cancer in his brother.   ROS:  Please see the history of present illness. Otherwise, complete review of systems is positive for none.  All other systems are reviewed and negative.   Physical Exam: VS:  There were no vitals taken for this visit., BMI There is no height or weight on file to calculate BMI.  Wt Readings from Last 3 Encounters:  06/12/21 (!) 302 lb (137 kg)  06/10/21 (!) 307 lb 3.2 oz (139.3 kg)  05/18/21 280 lb 6.8 oz (127.2 kg)    General: Patient appears comfortable at rest. Neck: Supple, no elevated JVP or carotid bruits, no thyromegaly. Lungs: Clear to auscultation, nonlabored breathing at rest. Cardiac: Regular rate and rhythm, no S3  or significant systolic murmur, no pericardial rub. Extremities: Mild non-pitting edema, distal pulses 2+. Skin: Warm and dry. Musculoskeletal: No kyphosis. Neuropsychiatric: Alert and oriented x3, affect grossly appropriate.  ECG:  EKG on 07/03/2019 showed sinus  tachycardia with a rate of 106 left axis deviation, consider left anterior fascicular block.  Probable anterior septal infarct, old  Recent Labwork: 05/17/2021: B Natriuretic Peptide 199.0 05/18/2021: ALT 16; ALT TEST CANCELLED PER RN; AST 13; AST TEST CANCELLED PER RN; Magnesium 2.3; Magnesium TEST CANCELLED PER RN 06/10/2021: BUN 66; Creatinine, Ser 9.41; Hemoglobin 10.5; Platelets 310; Potassium 4.9; Sodium 137     Component Value Date/Time   CHOL 100 05/18/2021 1147   CHOL 96 05/18/2021 1147   TRIG 131 05/18/2021 1147   TRIG 128 05/18/2021 1147   HDL 40 (L) 05/18/2021 1147   HDL 38 (L) 05/18/2021 1147   CHOLHDL 2.5 05/18/2021 1147   CHOLHDL 2.5 05/18/2021 1147   VLDL 26 05/18/2021 1147   VLDL 26 05/18/2021 1147   LDLCALC 34 05/18/2021 1147   LDLCALC 32 05/18/2021 1147    Other Studies Reviewed Today:  06/12/2021 Cath Procedures  LEFT HEART CATH AND CORONARY ANGIOGRAPHY   Conclusion      60% proximal to mid RCA, eccentric 40% PDA distal stenosis.   Less than 25% distal left main.   Ostial LAD with eccentric 40% stenosis.   Widely patent circumflex   Upper normal LV end-diastolic pressure.  Contrast ventriculography was not performed.   Aggressive risk factor modification including tight blood pressure control, LDL less than 70 and preferably less than 55, weight loss, evaluation for sleep apnea, and diabetes control. High risk for future cardiac events. Does not have critical coronary disease and could be a candidate for kidney transplantation with risk factor modification going forward.   Diagnostic Dominance: Right    Echocardiogram 05/15/2021  1. Left ventricular ejection fraction, by estimation, is 55 to  60%. The left ventricle has normal function. The left ventricle demonstrates regional wall motion abnormalities (see scoring diagram/findings for description). There is moderate left ventricular hypertrophy. Left ventricular diastolic parameters were normal. 2. Right ventricular systolic function is normal. The right ventricular size is normal. Tricuspid regurgitation signal is inadequate for assessing PA pressure. 3. There is a trivial pericardial effusion posterior to the left ventricle. 4. The mitral valve is grossly normal. Trivial mitral valve regurgitation. 5. The aortic valve is tricuspid. Aortic valve regurgitation is not visualized. 6. Aortic dilatation noted. There is moderate dilatation of the aortic root, measuring 44 mm. 7. The inferior vena cava is normal in size with greater than 50% respiratory variability, suggesting right atrial pressure of 3 mmHg.   Echocardiogram 06/23/2019  1. The left ventricle has low normal systolic function, with an ejection  fraction of 50-55%. The cavity size was normal. There is mildly increased  left ventricular wall thickness. Left ventricular diastolic Doppler  parameters are indeterminate.   2. The right ventricle has normal systolic function. The cavity was  normal. There is no increase in right ventricular wall thickness.   3. The aortic valve is tricuspid. Mild thickening of the aortic valve.   4. There is dilatation of the aortic root measuring 40 mm.   Assessment and Plan:  1. Status post non-ST elevation myocardial infarction (NSTEMI)   2. Chronic diastolic heart failure ([place])   3. ESRD (end stage renal disease) on dialysis ([place])   4. Essential hypertension   5. Suspected sleep apnea   6. Mixed hyperlipidemia     NSTEMI He was recently admitted for acute shortness of breath with elevated troponins.  Diagnosis was NSTEMI.  Cardiology recommended cardiac catheterization.  Patiently ultimately refused to undergo cardiac  catheterization and signed out AMA.  He stated he would follow-up outpatient.  Troponins were [id number].  I discussed the cardiologist's recommendation for cardiac catheterization.  Patient is now willing to undergo cardiac catheterization.  I discussed the risk and benefits at length with the patient and his wife.  He is willing to proceed.  Wife is asking for cardiac catheterization either on a Wednesday or Thursday.  Patient has scheduled left heart cath dx: NSTEMI, chest pain, SOB, elevated troponin Wednesday 06/12/2021 _0 :00 am arrive at 8:00 am with Dr. Tamala Julian   2. Chronic diastolic heart failure (HCC)  Recent echocardiogram 05/15/2021 EF 55 to 60%.  LV demonstrates regional wall motion abnormalities.  Moderate LVH, trivial pericardial effusion posterior to LV.  Trivial MR.  Continue furosemide 80 mg daily.  Coreg 25 mg p.o. twice daily  3. ESRD (end stage renal disease) on dialysis [facility]) Continues on dialysis Tuesday Thursdays and Saturdays.  He has a functioning AV fistula on his left upper extremity with positive thrill and bruit.  States he continues to take his blood pressure medications prior to dialysis due to labile blood pressures.  4. Essential hypertension Blood pressure 138/64  Continue amlodipine 5 mg daily, clonidine 0.3 mg 3 times daily, Coreg 25 mg p.o. twice daily.  5. Suspected sleep apnea  Refer to [facility] pulmonology clinic to evaluate for sleep apnea.  6.  Hyperlipidemia Continue Zetia 10 mg daily, atorvastatin 80 mg daily.  Medication Adjustments/Labs and Tests Ordered: Current medicines are reviewed at length with the patient today.  Concerns regarding medicines are outlined above.   Disposition: Follow-up with Dr. Harl Key or APP 1 month after cardiac catheterization  Signed, Levell July, NP 07/14/2021 4:32 PM    [facility], [place], [place] [address] Phone: [phone]; Fax: [phone]

## 2021-07-15 ENCOUNTER — Ambulatory Visit: Payer: Medicare Other | Admitting: Family Medicine

## 2021-07-15 DIAGNOSIS — E782 Mixed hyperlipidemia: Secondary | ICD-10-CM

## 2021-07-15 DIAGNOSIS — I252 Old myocardial infarction: Secondary | ICD-10-CM

## 2021-07-15 DIAGNOSIS — Z992 Dependence on renal dialysis: Secondary | ICD-10-CM

## 2021-07-15 DIAGNOSIS — I5032 Chronic diastolic (congestive) heart failure: Secondary | ICD-10-CM

## 2021-07-15 DIAGNOSIS — R29818 Other symptoms and signs involving the nervous system: Secondary | ICD-10-CM

## 2021-07-15 DIAGNOSIS — I10 Essential (primary) hypertension: Secondary | ICD-10-CM

## 2021-07-24 ENCOUNTER — Telehealth: Payer: Self-pay

## 2021-07-24 NOTE — Telephone Encounter (Signed)
Pt. Ready to schedule colonoscopy 

## 2021-07-24 NOTE — Telephone Encounter (Signed)
Returned patients call. Unable to leave message. 

## 2021-07-29 NOTE — Telephone Encounter (Signed)
Pt. Returning call about scheduling colonoscopy. Pt asked if we can call in the AM

## 2021-07-31 NOTE — Telephone Encounter (Signed)
Returned patients call. Unable to leave message. 

## 2021-08-02 ENCOUNTER — Institutional Professional Consult (permissible substitution): Payer: Medicare Other | Admitting: Pulmonary Disease

## 2021-08-13 ENCOUNTER — Telehealth: Payer: Self-pay

## 2021-08-13 ENCOUNTER — Other Ambulatory Visit: Payer: Self-pay

## 2021-08-13 DIAGNOSIS — Z1211 Encounter for screening for malignant neoplasm of colon: Secondary | ICD-10-CM

## 2021-08-13 NOTE — Telephone Encounter (Signed)
Procedure scheduled for 09/02/21.

## 2021-08-13 NOTE — Telephone Encounter (Signed)
Pt. Calling to schedule colonoscopy 

## 2021-08-13 NOTE — Progress Notes (Signed)
Gastroenterology Pre-Procedure Review  Request Date: 09/02/21 Requesting Physician: Dr. Vicente Males  PATIENT REVIEW QUESTIONS: The patient responded to the following health history questions as indicated:    1. Are you having any GI issues? no 2. Do you have a personal history of Polyps? no 3. Do you have a family history of Colon Cancer or Polyps? no 4. Diabetes Mellitus? yes (Type II) 5. Joint replacements in the past 12 months?no 6. Major health problems in the past 3 months?yes (congestive heart failure.) 7. Any artificial heart valves, MVP, or defibrillator?no    MEDICATIONS & ALLERGIES:    Patient reports the following regarding taking any anticoagulation/antiplatelet therapy:   Plavix, Coumadin, Eliquis, Xarelto, Lovenox, Pradaxa, Brilinta, or Effient? no Aspirin? yes (81 mg)  Patient confirms/reports the following medications:  Current Outpatient Medications  Medication Sig Dispense Refill   acetaminophen (TYLENOL) 500 MG tablet Take 1,000 mg by mouth every 6 (six) hours as needed for moderate pain or headache.     albuterol (VENTOLIN HFA) 108 (90 Base) MCG/ACT inhaler Inhale 2 puffs into the lungs every 4 (four) hours as needed for wheezing or shortness of breath.      allopurinol (ZYLOPRIM) 100 MG tablet Take 2 tablets (200 mg total) by mouth daily. 30 tablet 0   amLODipine (NORVASC) 10 MG tablet Take 0.5 tablets (5 mg total) by mouth daily. 30 tablet 1   aspirin EC 81 MG EC tablet Take 1 tablet (81 mg total) by mouth daily. 30 tablet 0   atorvastatin (LIPITOR) 80 MG tablet Take 1 tablet (80 mg total) by mouth daily. 90 tablet 3   Calcium Carbonate Antacid (CALCIUM CARBONATE, DOSED IN MG ELEMENTAL CALCIUM,) 1250 MG/5ML SUSP Take 5 mLs (500 mg of elemental calcium total) by mouth every 6 (six) hours as needed for indigestion. 450 mL    carvedilol (COREG) 25 MG tablet Take 1 tablet (25 mg total) by mouth 2 (two) times daily with a meal. 60 tablet 0   cloNIDine (CATAPRES) 0.3 MG tablet  Take 0.3 mg by mouth 3 (three) times daily.     diclofenac sodium (VOLTAREN) 1 % GEL Apply 2 g topically 4 (four) times daily.     ezetimibe (ZETIA) 10 MG tablet Take 10 mg by mouth every evening.     fluticasone (FLONASE) 50 MCG/ACT nasal spray Place 1 spray into both nostrils daily as needed for allergies or rhinitis.     furosemide (LASIX) 80 MG tablet Take 1 tablet (80 mg total) by mouth daily. 30 tablet 1   gabapentin (NEURONTIN) 100 MG capsule Take 1 capsule (100 mg total) by mouth 3 (three) times daily. 90 capsule 0   glucose blood (ACCU-CHEK AVIVA PLUS) test strip Use as instructed TO CHECK BLOOD GLUCOSE THREE TIMES DAILY 200 each 3   HYDROcodone-acetaminophen (NORCO/VICODIN) 5-325 MG tablet Take 1 tablet by mouth every 6 (six) hours as needed for severe pain. 10 tablet 0   Insulin Pen Needle (B-D ULTRAFINE III SHORT PEN) 31G X 8 MM MISC 1 each by Does not apply route as directed. 100 each 3   LEVEMIR FLEXTOUCH 100 UNIT/ML FlexTouch Pen INJECT 50 UNITS SUBCUTANEOUSLY AT BEDTIME 15 mL 0   linaclotide (LINZESS) 145 MCG CAPS capsule Take 1 capsule (145 mcg total) by mouth daily before breakfast. 30 capsule 0   multivitamin (RENA-VIT) TABS tablet Take 1 tablet by mouth at bedtime. 30 tablet 0   Na Sulfate-K Sulfate-Mg Sulf (SUPREP BOWEL PREP KIT) 17.5-3.13-1.6 GM/177ML SOLN Take 1 kit by  mouth as directed. (Patient not taking: No sig reported) 354 mL 0   NOVOLOG FLEXPEN 100 UNIT/ML FlexPen INJECT 8 TO 14 UNITS SUBCUTANEOUSLY THREE TIMES DAILY WITH MEALS 15 mL 0   oxyCODONE-acetaminophen (PERCOCET) 5-325 MG tablet Take 1-2 tablets by mouth every 6 (six) hours as needed. (Patient not taking: No sig reported) 12 tablet 0   OZEMPIC, 0.25 OR 0.5 MG/DOSE, 2 MG/1.5ML SOPN Inject 0.25 mg into the skin every Friday.     predniSONE (DELTASONE) 10 MG tablet Take 2 tablets (20 mg total) by mouth 2 (two) times daily. (Patient not taking: Reported on 06/11/2021) 20 tablet 0   sevelamer carbonate (RENVELA) 800  MG tablet Take 3 tablets (2,400 mg total) by mouth 3 (three) times daily with meals. 270 tablet 0   valACYclovir (VALTREX) 1000 MG tablet Take 1 tablet (1,000 mg total) by mouth 3 (three) times daily. (Patient not taking: No sig reported) 21 tablet 0   Current Facility-Administered Medications  Medication Dose Route Frequency Provider Last Rate Last Admin   0.9 %  sodium chloride infusion   Intravenous PRN Marcello Fennel, PA-C       bebtelovimab EUA injection SOLN 175 mg  175 mg Intravenous Once Marcello Fennel, PA-C        Patient confirms/reports the following allergies:  Allergies  Allergen Reactions   Lisinopril Swelling   Hydralazine Anxiety    Hallucinations     No orders of the defined types were placed in this encounter.   AUTHORIZATION INFORMATION Primary Insurance: 1D#: Group #:  Secondary Insurance: 1D#: Group #:  SCHEDULE INFORMATION: Date: 09/02/21 Time: Location: [place]

## 2021-08-16 ENCOUNTER — Other Ambulatory Visit: Payer: Self-pay

## 2021-08-16 ENCOUNTER — Ambulatory Visit (INDEPENDENT_AMBULATORY_CARE_PROVIDER_SITE_OTHER): Payer: Medicare Other | Admitting: Urology

## 2021-08-16 ENCOUNTER — Encounter: Payer: Self-pay | Admitting: Urology

## 2021-08-16 VITALS — BP 142/84 | HR 88 | Temp 98.0°F | Wt 304.2 lb

## 2021-08-16 DIAGNOSIS — N529 Male erectile dysfunction, unspecified: Secondary | ICD-10-CM | POA: Diagnosis not present

## 2021-08-16 MED ORDER — VARDENAFIL HCL 20 MG PO TABS: 20.0000 mg | ORAL_TABLET | Freq: Every day | ORAL | 11 refills | Status: DC | PRN

## 2021-08-16 NOTE — Progress Notes (Signed)
Urological Symptom Review  Patient is experiencing the following symptoms: Frequent urination Get up at night to urinate Stream starts and stops Injury to kidneys/bladder Weak stream Erection problems (male only)   Review of Systems  Gastrointestinal (upper)  : Indigestion/heartburn  Gastrointestinal (lower) : Constipation  Constitutional : Night Sweats Fatigue  Skin: Negative for skin symptoms  Eyes: Blurred vision  Ear/Nose/Throat : Sore throat  Hematologic/Lymphatic: Negative for Hematologic/Lymphatic symptoms  Cardiovascular : Leg swelling  Respiratory : Cough Shortness of breath  Endocrine: Excessive thirst  Musculoskeletal: Back pain Joint pain  Neurological: Headaches Dizziness  Psychologic: Negative for psychiatric symptoms

## 2021-08-16 NOTE — Progress Notes (Signed)
Assessment: 1. Organic impotence     Plan: Today I had a long discussion with the patient spending a total of 30 minutes discussing ED.  I discussed the pathophysiology, etiology, and natural history of ED as well as management options using a goal-oriented approach.  We discussed the following options: Medical therapy, vacuum erection device, penile injections, intraurethral suppository therapy (MUSE), and penile prosthesis. Patient educational materials concerning these options were given to the patient.  Rx for Levitra 20 mg provided Return to office in 6-8 weeks  I personally spent 45 minutes involved in face to face and non-face-to-face activities for this patient on the day of the visit.  Professional time spent included the following activities, in addition to those noted in the documentation: Review of patient's medical records, discussion of potential causes of erectile dysfunction specifically as it pertains to his medical history, and a lengthy discussion of management options.   Chief Complaint:  Chief Complaint  Patient presents with   Erectile Dysfunction   History of Present Illness:  Kelly Key is a 50 y.o. year old male who is seen in consultation from [name], MD for evaluation of erectile dysfunction.  He reports symptoms have been present for approximately 1 year with gradual worsening.  He is able to achieve a partial erection with 50% rigidity but has rapid detumescence.  He has unable to use his erections for intercourse.  No nocturnal or early morning erections.  No decrease in his libido.  No pain or curvature with erections.  He has previously tried Viagra 100 mg without benefit.  He has also tried some over-the-counter medications without benefit. He is on hemodialysis for end-stage renal disease.  He voids approximately twice a day.  He is not having any significant voiding symptoms at this time.   Past Medical History:  Past Medical History:   Diagnosis Date   Acute diastolic CHF (congestive heart failure) ([place]) 06/11/2012   EF 50-55% [facility])   Chronic kidney disease    COPD (chronic obstructive pulmonary disease) (HCC)    Diabetes mellitus    A1c 11.5 (06/11/2012).   Dyspnea    Gout    Hepatic steatosis 06/11/2012   Elevated LFTs   Hyperlipemia    Malignant hypertension    Microcytic anemia 06/12/2012   Obesity     Past Surgical History:  Past Surgical History:  Procedure Laterality Date   AV FISTULA PLACEMENT Left 11/11/2019   Procedure: ARTERIOVENOUS (AV) FISTULA CREATION LEFT BRACHIOCEPHALIC ARM;  Surgeon: Serafina Mitchell, MD;  [facility];  Service: Vascular;  Laterality: Left;   IR FLUORO GUIDE CV LINE RIGHT  07/08/2019   IR REMOVAL TUN CV CATH W/O FL  03/23/2020   IR US GUIDE VASC ACCESS RIGHT  07/08/2019   LEFT HEART CATH AND CORONARY ANGIOGRAPHY N/A 06/12/2021   Procedure: LEFT HEART CATH AND CORONARY ANGIOGRAPHY;  Surgeon: Belva Crome, MD;  [facility];  Service: Cardiovascular;  Laterality: N/A;   NO PAST SURGERIES     VASCULAR SURGERY      Allergies:  Allergies  Allergen Reactions   Lisinopril Swelling   Hydralazine Anxiety    Hallucinations     Family History:  Family History  Problem Relation Age of Onset   Gout Mother    Asthma Mother    Diabetes Father    Heart failure Father    Diabetes Sister    Hypertension Brother    Pancreatic cancer Brother  Diabetes Sister     Social History:  Social History   Tobacco Use   Smoking status: Some Days    Types: Cigarettes, Cigars   Smokeless tobacco: Never   Tobacco comments:    Pt states he does not inhale  Vaping Use   Vaping Use: Never used  Substance Use Topics   Alcohol use: Not Currently    Comment: haven't drank in over 5 years    Drug use: No    Review of symptoms:  Constitutional:  Negative for unexplained weight loss, night sweats, fever, chills ENT:  Negative for nose bleeds, sinus pain,  painful swallowing CV:  Negative for chest pain, shortness of breath, exercise intolerance, palpitations, loss of consciousness Resp:  Negative for cough, wheezing, shortness of breath GI:  Negative for nausea, vomiting, diarrhea, bloody stools GU:  Positives noted in HPI; otherwise negative for gross hematuria, dysuria, urinary incontinence Neuro:  Negative for seizures, poor balance, limb weakness, slurred speech Psych:  Negative for lack of energy, depression, anxiety Endocrine:  Negative for polydipsia, polyuria, symptoms of hypoglycemia (dizziness, hunger, sweating) Hematologic:  Negative for anemia, purpura, petechia, prolonged or excessive bleeding, use of anticoagulants  Allergic:  Negative for difficulty breathing or choking as a result of exposure to anything; no shellfish allergy; no allergic response (rash/itch) to materials, foods  Physical exam: BP (!) 142/84   Pulse 88   Temp 98 F (36.7 C)   Wt (!) 304 lb 3.2 oz (138 kg)   BMI 47.64 kg/m  GENERAL APPEARANCE:  Well appearing, well developed, well nourished, NAD HEENT: Atraumatic, Normocephalic, oropharynx clear. NECK: Supple without lymphadenopathy or thyromegaly. LUNGS: Clear to auscultation bilaterally. HEART: Regular Rate and Rhythm without murmurs, gallops, or rubs. ABDOMEN: Soft, non-tender, No Masses. EXTREMITIES: Moves all extremities well.  Without clubbing, cyanosis, or edema. NEUROLOGIC:  Alert and oriented x 3, normal gait, CN II-XII grossly intact.  MENTAL STATUS:  Appropriate. BACK:  Non-tender to palpation.  No CVAT SKIN:  Warm, dry and intact.   GU: Penis:  uncircumcised Meatus: Normal Scrotum: normal, no masses Testis: normal without masses bilateral Epididymis: normal Prostate: difficult to feel due to body habitus, NT Rectum: normal, no masses   Results: No specimen obtained

## 2021-09-02 ENCOUNTER — Ambulatory Visit: Payer: Medicare Other | Admitting: Certified Registered"

## 2021-09-02 ENCOUNTER — Encounter
Admission: RE | Disposition: A | Discharge status: Home / Self Care | Payer: Self-pay | Source: Home / Self Care | Attending: Gastroenterology

## 2021-09-02 ENCOUNTER — Encounter: Payer: Self-pay | Admitting: Gastroenterology

## 2021-09-02 ENCOUNTER — Ambulatory Visit
Admission: RE | Admit: 2021-09-02 | Discharge: 2021-09-02 | Disposition: A | Discharge status: Home / Self Care | Payer: Medicare Other | Attending: Gastroenterology | Admitting: Gastroenterology

## 2021-09-02 ENCOUNTER — Other Ambulatory Visit: Payer: Self-pay

## 2021-09-02 DIAGNOSIS — J449 Chronic obstructive pulmonary disease, unspecified: Secondary | ICD-10-CM | POA: Diagnosis not present

## 2021-09-02 DIAGNOSIS — Z1211 Encounter for screening for malignant neoplasm of colon: Secondary | ICD-10-CM | POA: Diagnosis present

## 2021-09-02 DIAGNOSIS — I5030 Unspecified diastolic (congestive) heart failure: Secondary | ICD-10-CM | POA: Diagnosis not present

## 2021-09-02 DIAGNOSIS — Z87891 Personal history of nicotine dependence: Secondary | ICD-10-CM | POA: Diagnosis not present

## 2021-09-02 DIAGNOSIS — N189 Chronic kidney disease, unspecified: Secondary | ICD-10-CM | POA: Insufficient documentation

## 2021-09-02 DIAGNOSIS — E1122 Type 2 diabetes mellitus with diabetic chronic kidney disease: Secondary | ICD-10-CM | POA: Insufficient documentation

## 2021-09-02 DIAGNOSIS — I130 Hypertensive heart and chronic kidney disease with heart failure and stage 1 through stage 4 chronic kidney disease, or unspecified chronic kidney disease: Secondary | ICD-10-CM | POA: Diagnosis not present

## 2021-09-02 DIAGNOSIS — Z6841 Body Mass Index (BMI) 40.0 and over, adult: Secondary | ICD-10-CM | POA: Diagnosis not present

## 2021-09-02 DIAGNOSIS — E669 Obesity, unspecified: Secondary | ICD-10-CM | POA: Diagnosis not present

## 2021-09-02 HISTORY — PX: COLONOSCOPY WITH PROPOFOL: SHX5780

## 2021-09-02 LAB — GLUCOSE, CAPILLARY: Glucose-Capillary: 137 mg/dL — ABNORMAL HIGH (ref 70–99)

## 2021-09-02 SURGERY — COLONOSCOPY WITH PROPOFOL
Anesthesia: General

## 2021-09-02 MED ORDER — GLYCOPYRROLATE 0.2 MG/ML IJ SOLN
INTRAMUSCULAR | Status: DC | PRN
  Administered 2021-09-02: .2 mg via INTRAVENOUS

## 2021-09-02 MED ORDER — MIDAZOLAM HCL 2 MG/2ML IJ SOLN
INTRAMUSCULAR | Status: AC
  Filled 2021-09-02: qty 2

## 2021-09-02 MED ORDER — LIDOCAINE 2% (20 MG/ML) 5 ML SYRINGE
INTRAMUSCULAR | Status: DC | PRN
  Administered 2021-09-02: 25 mg via INTRAVENOUS

## 2021-09-02 MED ORDER — PROPOFOL 500 MG/50ML IV EMUL
INTRAVENOUS | Status: DC | PRN
  Administered 2021-09-02: 120 ug/kg/min via INTRAVENOUS

## 2021-09-02 MED ORDER — MIDAZOLAM HCL 5 MG/5ML IJ SOLN
INTRAMUSCULAR | Status: DC | PRN
Start: 2021-09-02 — End: 2021-09-02
  Administered 2021-09-02: 2 mg via INTRAVENOUS

## 2021-09-02 MED ORDER — PROPOFOL 10 MG/ML IV BOLUS
INTRAVENOUS | Status: DC | PRN
  Administered 2021-09-02: 70 mg via INTRAVENOUS

## 2021-09-02 MED ORDER — SODIUM CHLORIDE 0.9 % IV SOLN: INTRAVENOUS | Status: DC

## 2021-09-02 NOTE — Progress Notes (Signed)
Patient verbalized that he only drank 1/2 of the Colon Prep prescribed for him which was Suprep.  Colonoscopy aborted when solid stool found in the colon.

## 2021-09-02 NOTE — H&P (Signed)
Jonathon Bellows, MD [address] [place], [place], [place], [address] [address], [place], [place], [place], [address] Phone: [phone]  Fax: [phone]  Primary Care Physician:  [name], MD   Pre-Procedure History & Physical: HPI:  Kelly Key is a 50 y.o. male is here for an colonoscopy.   Past Medical History:  Diagnosis Date   Acute diastolic CHF (congestive heart failure) ([place]) 06/11/2012   EF 50-55% [facility])   Chronic kidney disease    COPD (chronic obstructive pulmonary disease) (HCC)    Diabetes mellitus    A1c 11.5 (06/11/2012).   Dyspnea    Gout    Hepatic steatosis 06/11/2012   Elevated LFTs   Hyperlipemia    Malignant hypertension    Microcytic anemia 06/12/2012   Obesity     Past Surgical History:  Procedure Laterality Date   AV FISTULA PLACEMENT Left 11/11/2019   Procedure: ARTERIOVENOUS (AV) FISTULA CREATION LEFT BRACHIOCEPHALIC ARM;  Surgeon: Serafina Mitchell, MD;  [facility];  Service: Vascular;  Laterality: Left;   IR FLUORO GUIDE CV LINE RIGHT  07/08/2019   IR REMOVAL TUN CV CATH W/O FL  03/23/2020   IR US GUIDE VASC ACCESS RIGHT  07/08/2019   LEFT HEART CATH AND CORONARY ANGIOGRAPHY N/A 06/12/2021   Procedure: LEFT HEART CATH AND CORONARY ANGIOGRAPHY;  Surgeon: Belva Crome, MD;  Location: [place] [facility];  Service: Cardiovascular;  Laterality: N/A;   NO PAST SURGERIES     VASCULAR SURGERY      Prior to Admission medications   Medication Sig Start Date End Date Taking? Authorizing Provider  acetaminophen (TYLENOL) 500 MG tablet Take 1,000 mg by mouth every 6 (six) hours as needed for moderate pain or headache.   Yes [provider]  albuterol (VENTOLIN HFA) 108 (90 Base) MCG/ACT inhaler Inhale 2 puffs into the lungs every 4 (four) hours as needed for wheezing or shortness of breath.  03/24/19  Yes [provider]  allopurinol (ZYLOPRIM) 100 MG tablet Take 2 tablets (200 mg total) by mouth daily. 08/04/19   Yes Love, Ivan Anchors, PA-C  aspirin EC 81 MG EC tablet Take 1 tablet (81 mg total) by mouth daily. 08/04/19  Yes Love, Ivan Anchors, PA-C  atorvastatin (LIPITOR) 80 MG tablet Take 1 tablet (80 mg total) by mouth daily. 12/05/19  Yes Branch, Alphonse Guild, MD  Calcium Carbonate Antacid (CALCIUM CARBONATE, DOSED IN MG ELEMENTAL CALCIUM,) 1250 MG/5ML SUSP Take 5 mLs (500 mg of elemental calcium total) by mouth every 6 (six) hours as needed for indigestion. 07/16/19  Yes Amin, Jeanella Flattery, MD  carvedilol (COREG) 25 MG tablet Take 1 tablet (25 mg total) by mouth 2 (two) times daily with a meal. 08/03/19  Yes Love, Ivan Anchors, PA-C  cloNIDine (CATAPRES) 0.3 MG tablet Take 0.3 mg by mouth 3 (three) times daily.   Yes [provider]  diclofenac sodium (VOLTAREN) 1 % GEL Apply 2 g topically 4 (four) times daily. 08/03/19  Yes Love, Ivan Anchors, PA-C  ezetimibe (ZETIA) 10 MG tablet Take 10 mg by mouth every evening. 01/20/19  Yes [provider]  fluticasone (FLONASE) 50 MCG/ACT nasal spray Place 1 spray into both nostrils daily as needed for allergies or rhinitis.   Yes [provider]  gabapentin (NEURONTIN) 100 MG capsule Take 1 capsule (100 mg total) by mouth 3 (three) times daily. 08/03/19  Yes Love, Ivan Anchors, PA-C  linaclotide (LINZESS) 145 MCG CAPS  capsule Take 1 capsule (145 mcg total) by mouth daily before breakfast. 04/05/18  Yes Manuella Ghazi, Pratik D, DO  multivitamin (RENA-VIT) TABS tablet Take 1 tablet by mouth at bedtime. 08/03/19  Yes Love, Ivan Anchors, PA-C  NOVOLOG FLEXPEN 100 UNIT/ML FlexPen INJECT 8 TO 14 UNITS SUBCUTANEOUSLY THREE TIMES DAILY WITH MEALS 07/09/21  Yes Brita Romp, NP  sevelamer carbonate (RENVELA) 800 MG tablet Take 3 tablets (2,400 mg total) by mouth 3 (three) times daily with meals. 08/03/19  Yes Love, Ivan Anchors, PA-C  vardenafil (LEVITRA) 20 MG tablet Take 1 tablet (20 mg total) by mouth daily as needed for erectile dysfunction. 08/16/21  Yes Stoneking, Reece Leader., MD   amLODipine (NORVASC) 10 MG tablet Take 0.5 tablets (5 mg total) by mouth daily. 05/16/21 06/15/21  Deatra James, MD  furosemide (LASIX) 80 MG tablet Take 1 tablet (80 mg total) by mouth daily. 05/16/21 06/15/21  Shahmehdi, Valeria Batman, MD  glucose blood (ACCU-CHEK AVIVA PLUS) test strip Use as instructed TO CHECK BLOOD GLUCOSE THREE TIMES DAILY 08/15/20   Nida, Marella Chimes, MD  HYDROcodone-acetaminophen (NORCO/VICODIN) 5-325 MG tablet Take 1 tablet by mouth every 6 (six) hours as needed for severe pain. Patient not taking: Reported on 09/02/2021 11/11/19   Dagoberto Ligas, PA-C  Insulin Pen Needle (B-D ULTRAFINE III SHORT PEN) 31G X 8 MM MISC 1 each by Does not apply route as directed. 08/13/20   Cassandria Anger, MD  LEVEMIR FLEXTOUCH 100 UNIT/ML FlexTouch Pen INJECT 50 UNITS SUBCUTANEOUSLY AT BEDTIME 07/09/21   Reardon, Juanetta Beets, NP  Na Sulfate-K Sulfate-Mg Sulf (SUPREP BOWEL PREP KIT) 17.5-3.13-1.6 GM/177ML SOLN Take 1 kit by mouth as directed. 04/11/21   Lucilla Lame, MD  oxyCODONE-acetaminophen (PERCOCET) 5-325 MG tablet Take 1-2 tablets by mouth every 6 (six) hours as needed. Patient not taking: Reported on 09/02/2021 04/24/21   Veryl Speak, MD  OZEMPIC, 0.25 OR 0.5 MG/DOSE, 2 MG/1.5ML SOPN Inject 0.25 mg into the skin every Friday. 11/23/20   [provider]  predniSONE (DELTASONE) 10 MG tablet Take 2 tablets (20 mg total) by mouth 2 (two) times daily. Patient not taking: Reported on 09/02/2021 04/24/21   Veryl Speak, MD  valACYclovir (VALTREX) 1000 MG tablet Take 1 tablet (1,000 mg total) by mouth 3 (three) times daily. 04/24/21   Veryl Speak, MD    Allergies as of 08/13/2021 - Review Complete 06/12/2021  Allergen Reaction Noted   Lisinopril Swelling 06/11/2012   Hydralazine Anxiety 05/05/2018    Family History  Problem Relation Age of Onset   Gout Mother    Asthma Mother    Diabetes Father    Heart failure Father    Diabetes Sister    Hypertension Brother     Pancreatic cancer Brother    Diabetes Sister     Social History   Socioeconomic History   Marital status: Single    Spouse name: Not on file   Number of children: Not on file   Years of education: Not on file   Highest education level: Not on file  Occupational History   Occupation: retired  Tobacco Use   Smoking status: Former    Types: Cigarettes, Cigars    Passive exposure: Current   Smokeless tobacco: Never   Tobacco comments:    Pt states he does not inhale  Vaping Use   Vaping Use: Never used  Substance and Sexual Activity   Alcohol use: Not Currently    Comment: haven't drank in over 5 years  Drug use: No   Sexual activity: Not Currently  Other Topics Concern   Not on file  Social History Narrative   Not on file   Social Determinants of Health   Financial Resource Strain: Not on file  Food Insecurity: Not on file  Transportation Needs: Not on file  Physical Activity: Not on file  Stress: Not on file  Social Connections: Not on file  Intimate Partner Violence: Not on file    Review of Systems: See HPI, otherwise negative ROS  Physical Exam: There were no vitals taken for this visit. General:   Alert,  pleasant and cooperative in NAD Head:  Normocephalic and atraumatic. Neck:  Supple; no masses or thyromegaly. Lungs:  Clear throughout to auscultation, normal respiratory effort.    Heart:  +S1, +S2, Regular rate and rhythm, No edema. Abdomen:  Soft, nontender and nondistended. Normal bowel sounds, without guarding, and without rebound.   Neurologic:  Alert and  oriented x4;  grossly normal neurologically.  Impression/Plan: PEYSON POSTEMA is here for an colonoscopy to be performed for Screening colonoscopy average risk   Risks, benefits, limitations, and alternatives regarding  colonoscopy have been reviewed with the patient.  Questions have been answered.  All parties agreeable.   Jonathon Bellows, MD  09/02/2021, 9:22 AM

## 2021-09-02 NOTE — Anesthesia Preprocedure Evaluation (Signed)
Anesthesia Evaluation  Patient identified by MRN, date of birth, ID band Patient awake    Reviewed: Allergy & Precautions, NPO status , Patient's Chart, lab work & pertinent test results  Airway Mallampati: III  TM Distance: >3 FB Neck ROM: full    Dental  (+) Upper Dentures, Lower Dentures   Pulmonary neg pulmonary ROS, shortness of breath, with exertion and lying, COPD, former smoker,    Pulmonary exam normal  + decreased breath sounds      Cardiovascular Exercise Tolerance: Poor hypertension, Pt. on medications +CHF  negative cardio ROS Normal cardiovascular examII Rhythm:Regular     Neuro/Psych negative neurological ROS  negative psych ROS   GI/Hepatic negative GI ROS, Neg liver ROS,   Endo/Other  negative endocrine ROSdiabetes, Type 1, Insulin DependentMorbid obesity  Renal/GU CRFRenal diseasenegative Renal ROS  negative genitourinary   Musculoskeletal  (+) Arthritis ,   Abdominal (+) + obese,   Peds negative pediatric ROS (+)  Hematology negative hematology ROS (+) Blood dyscrasia, anemia ,   Anesthesia Other Findings Past Medical History: 3/76[birth year]31: Acute diastolic CHF (congestive heart failure) ([facility])     Comment:  EF 50-55% [facility]) No date: Chronic kidney disease No date: COPD (chronic obstructive pulmonary disease) (HCC) No date: Diabetes mellitus     Comment:  A1c 11.5 (06/11/2012). No date: Dyspnea No date: Gout 06/11/2012: Hepatic steatosis     Comment:  Elevated LFTs No date: Hyperlipemia No date: Malignant hypertension 06/12/2012: Microcytic anemia No date: Obesity  Past Surgical History: 11/11/2019: AV FISTULA PLACEMENT; Left     Comment:  Procedure: ARTERIOVENOUS (AV) FISTULA CREATION LEFT               BRACHIOCEPHALIC ARM;  Surgeon: Serafina Mitchell, MD;                [facility];  Service: Vascular;  Laterality: Left; 07/08/2019: IR FLUORO GUIDE CV LINE RIGHT 03/23/2020:  IR REMOVAL TUN CV CATH W/O FL 07/08/2019: IR US GUIDE VASC ACCESS RIGHT 06/12/2021: LEFT HEART CATH AND CORONARY ANGIOGRAPHY; N/A     Comment:  Procedure: LEFT HEART CATH AND CORONARY ANGIOGRAPHY;                Surgeon: Belva Crome, MD;  Location: [facility]               LAB;  Service: Cardiovascular;  Laterality: N/A; No date: NO PAST SURGERIES No date: VASCULAR SURGERY  BMI    Body Mass Index: 44.60 kg/m      Reproductive/Obstetrics negative OB ROS                             Anesthesia Physical Anesthesia Plan  ASA: 4  Anesthesia Plan: General   Post-op Pain Management:    Induction: Intravenous  PONV Risk Score and Plan: Propofol infusion and TIVA  Airway Management Planned: Nasal Cannula  Additional Equipment:   Intra-op Plan:   Post-operative Plan:   Informed Consent: I have reviewed the patients History and Physical, chart, labs and discussed the procedure including the risks, benefits and alternatives for the proposed anesthesia with the patient or authorized representative who has indicated his/her understanding and acceptance.     Dental Advisory Given  Plan Discussed with: CRNA and Surgeon  Anesthesia Plan Comments:         Anesthesia Quick Evaluation

## 2021-09-02 NOTE — Op Note (Signed)
[facility] Patient Name: Kelly Key Procedure Date: 09/02/2021 9:35 AM MRN: [id number] Account #: [id number] Date of Birth: Aug 20, 1971 Admit Type: Outpatient Age: 50 Room: [facility] ENDO ROOM 4 Gender: Male Note Status: Finalized Instrument Name: Park Meo [id number] Procedure:             Colonoscopy Indications:           Screening for colorectal malignant neoplasm Providers:             Jonathon Bellows MD, MD Medicines:             Monitored Anesthesia Care Complications:         No immediate complications. Procedure:             Pre-Anesthesia Assessment:                        - Prior to the procedure, a History and Physical was                         performed, and patient medications, allergies and                         sensitivities were reviewed. The patient's tolerance                         of previous anesthesia was reviewed.                        - The risks and benefits of the procedure and the                         sedation options and risks were discussed with the                         patient. All questions were answered and informed                         consent was obtained.                        - ASA Grade Assessment: III - A patient with severe                         systemic disease.                        After obtaining informed consent, the colonoscope was                         passed under direct vision. Throughout the procedure,                         the patient's blood pressure, pulse, and oxygen                         saturations were monitored continuously. The                         Colonoscope was introduced through the anus with the  intention of advancing to the cecum. The scope was                         advanced to the sigmoid colon before the procedure was                         aborted. Medications were given. The colonoscopy was                         performed with ease.  The patient tolerated the                         procedure well. The quality of the bowel preparation                         was inadequate. Findings:      The perianal and digital rectal examinations were normal.      Solid stool was found in the rectum and in the sigmoid colon,       interfering with visualization. Impression:            - Preparation of the colon was inadequate.                        - Stool in the rectum and in the sigmoid colon.                        - No specimens collected. Recommendation:        - Discharge patient to home (with escort).                        - Resume previous diet.                        - Continue present medications.                        - Repeat colonoscopy in 6 weeks because the bowel                         preparation was suboptimal. Procedure Code(s):     --- Professional ---                        [phone], 53, Colonoscopy, flexible; diagnostic,                         including collection of specimen(s) by brushing or                         washing, when performed (separate procedure) Diagnosis Code(s):     --- Professional ---                        Z12.11, Encounter for screening for malignant neoplasm                         of colon CPT copyright 2019 [employer]. All rights reserved. The codes documented in this report are preliminary and upon coder review may  be revised to meet current  compliance requirements. Jonathon Bellows, MD Jonathon Bellows MD, MD 09/02/2021 9:58:36 AM This report has been signed electronically. Number of Addenda: 0 Note Initiated On: 09/02/2021 9:35 AM Total Procedure Duration: 0 hours 0 minutes 34 seconds  Estimated Blood Loss:  Estimated blood loss: none.      [facility]

## 2021-09-02 NOTE — Transfer of Care (Signed)
Immediate Anesthesia Transfer of Care Note  Patient: Kelly Key  Procedure(s) Performed: COLONOSCOPY WITH PROPOFOL  Patient Location: Endoscopy Unit  Anesthesia Type:General  Level of Consciousness: awake and alert   Airway & Oxygen Therapy: Patient Spontanous Breathing  Post-op Assessment: Report given to RN and Post -op Vital signs reviewed and stable  Post vital signs: Reviewed  Last Vitals:  Vitals Value Taken Time  BP 117/69 09/02/21 1003  Temp 35.9 C 09/02/21 1003  Pulse 94 09/02/21 1004  Resp 18 09/02/21 1004  SpO2 100 % 09/02/21 1004  Vitals shown include unvalidated device data.  Last Pain:  Vitals:   09/02/21 1003  TempSrc: Temporal  PainSc:          Complications: No notable events documented.

## 2021-09-03 ENCOUNTER — Encounter: Payer: Self-pay | Admitting: Gastroenterology

## 2021-09-17 ENCOUNTER — Ambulatory Visit: Payer: Medicare Other | Admitting: Urology

## 2021-09-17 NOTE — Progress Notes (Deleted)
Assessment: 1. Organic impotence      Plan: Today I had a long discussion with the patient spending a total of 30 minutes discussing ED.  I discussed the pathophysiology, etiology, and natural history of ED as well as management options using a goal-oriented approach.  We discussed the following options: Medical therapy, vacuum erection device, penile injections, intraurethral suppository therapy (MUSE), and penile prosthesis. Patient educational materials concerning these options were given to the patient.  Rx for Levitra 20 mg provided Return to office in 6-8 weeks  I personally spent 45 minutes involved in face to face and non-face-to-face activities for this patient on the day of the visit.  Professional time spent included the following activities, in addition to those noted in the documentation: Review of patient's medical records, discussion of potential causes of erectile dysfunction specifically as it pertains to his medical history, and a lengthy discussion of management options.   Chief Complaint:  No chief complaint on file.  History of Present Illness:  Kelly Key is a 50 y.o. year old male who is seen for further evaluation of erectile dysfunction.  He reported his symptoms have been present for approximately 1 year with gradual worsening.  He is able to achieve a partial erection with 50% rigidity but has rapid detumescence.  He has unable to use his erections for intercourse.  No nocturnal or early morning erections.  No decrease in his libido.  No pain or curvature with erections.  He has previously tried Viagra 100 mg without benefit.  He has also tried some over-the-counter medications without benefit. He is on hemodialysis for end-stage renal disease.  He voids approximately twice a day.  He is not having any significant voiding symptoms at this time.  He was given a rx for Levitra at this visit in 10/22.   Portions of the above documentation were copied from a  prior visit for review purposes only.   Past Medical History:  Past Medical History:  Diagnosis Date   Acute diastolic CHF (congestive heart failure) ([place]) 06/11/2012   EF 50-55% [facility])   Chronic kidney disease    COPD (chronic obstructive pulmonary disease) (HCC)    Diabetes mellitus    A1c 11.5 (06/11/2012).   Dyspnea    Gout    Hepatic steatosis 06/11/2012   Elevated LFTs   Hyperlipemia    Malignant hypertension    Microcytic anemia 06/12/2012   Obesity     Past Surgical History:  Past Surgical History:  Procedure Laterality Date   AV FISTULA PLACEMENT Left 11/11/2019   Procedure: ARTERIOVENOUS (AV) FISTULA CREATION LEFT BRACHIOCEPHALIC ARM;  Surgeon: Serafina Mitchell, MD;  Location: [place];  Service: Vascular;  Laterality: Left;   COLONOSCOPY WITH PROPOFOL N/A 09/02/2021   Procedure: COLONOSCOPY WITH PROPOFOL;  Surgeon: Jonathon Bellows, MD;  Location: [facility] ENDOSCOPY;  Service: Gastroenterology;  Laterality: N/A;   IR FLUORO GUIDE CV LINE RIGHT  07/08/2019   IR REMOVAL TUN CV CATH W/O FL  03/23/2020   IR US GUIDE VASC ACCESS RIGHT  07/08/2019   LEFT HEART CATH AND CORONARY ANGIOGRAPHY N/A 06/12/2021   Procedure: LEFT HEART CATH AND CORONARY ANGIOGRAPHY;  Surgeon: Belva Crome, MD;  Location: [place] CV LAB;  Service: Cardiovascular;  Laterality: N/A;   NO PAST SURGERIES     VASCULAR SURGERY      Allergies:  Allergies  Allergen Reactions   Hydralazine Anxiety    Hallucinations    Lisinopril Swelling  Family History:  Family History  Problem Relation Age of Onset   Gout Mother    Asthma Mother    Diabetes Father    Heart failure Father    Diabetes Sister    Hypertension Brother    Pancreatic cancer Brother    Diabetes Sister     Social History:  Social History   Tobacco Use   Smoking status: Former    Types: Cigarettes, Cigars    Passive exposure: Current   Smokeless tobacco: Never   Tobacco comments:    Pt states he does not inhale  Vaping  Use   Vaping Use: Never used  Substance Use Topics   Alcohol use: Not Currently    Comment: haven't drank in over 5 years    Drug use: No    ROS: Constitutional:  Negative for fever, chills, weight loss CV: Negative for chest pain, previous MI, hypertension Respiratory:  Negative for shortness of breath, wheezing, sleep apnea, frequent cough GI:  Negative for nausea, vomiting, bloody stool, GERD  Physical exam: There were no vitals taken for this visit. ***  Results: No specimen obtained

## 2021-09-23 ENCOUNTER — Other Ambulatory Visit: Payer: Self-pay | Admitting: Nurse Practitioner

## 2021-09-24 ENCOUNTER — Other Ambulatory Visit: Payer: Self-pay | Admitting: Nurse Practitioner

## 2021-09-25 ENCOUNTER — Other Ambulatory Visit: Payer: Self-pay | Admitting: "Endocrinology

## 2021-09-25 ENCOUNTER — Ambulatory Visit: Payer: Medicare Other | Admitting: Nurse Practitioner

## 2021-09-25 ENCOUNTER — Telehealth: Payer: Self-pay | Admitting: Nurse Practitioner

## 2021-09-25 ENCOUNTER — Encounter: Payer: Self-pay | Admitting: Nurse Practitioner

## 2021-09-25 NOTE — Telephone Encounter (Signed)
Pt came in for his appointment over an hour late. Informed him he could not be seen today. He said he would have to find a new doctor if we could not see him today. Informed patient to contact his PCP. After speaking with whitney, patient has been non complaint and missed last 2 appointments and was late today. Dismissal letter has been put in the mail.

## 2021-09-26 NOTE — Anesthesia Postprocedure Evaluation (Signed)
Anesthesia Post Note  Patient: Kelly Key  Procedure(s) Performed: COLONOSCOPY WITH PROPOFOL  Patient location during evaluation: PACU Anesthesia Type: General Level of consciousness: awake and awake and alert Pain management: satisfactory to patient Vital Signs Assessment: post-procedure vital signs reviewed and stable Respiratory status: spontaneous breathing and nonlabored ventilation Cardiovascular status: blood pressure returned to baseline Anesthetic complications: no   No notable events documented.   Last Vitals:  Vitals:   09/02/21 0927 09/02/21 1003  BP: (!) 145/99 117/69  Pulse: 86   Resp: 20   Temp: (!) 35.9 C (!) 35.9 C  SpO2: 100%     Last Pain:  Vitals:   09/02/21 1023  TempSrc:   PainSc: 0-No pain                 [name],[name]

## 2021-10-04 ENCOUNTER — Other Ambulatory Visit: Payer: Self-pay

## 2021-10-04 ENCOUNTER — Ambulatory Visit (INDEPENDENT_AMBULATORY_CARE_PROVIDER_SITE_OTHER): Payer: Medicare Other | Admitting: Pulmonary Disease

## 2021-10-04 ENCOUNTER — Encounter: Payer: Self-pay | Admitting: Pulmonary Disease

## 2021-10-04 VITALS — BP 112/88 | HR 89 | Temp 98.5°F | Ht 69.0 in | Wt 308.0 lb

## 2021-10-04 DIAGNOSIS — R0683 Snoring: Secondary | ICD-10-CM

## 2021-10-04 DIAGNOSIS — G4733 Obstructive sleep apnea (adult) (pediatric): Secondary | ICD-10-CM

## 2021-10-04 DIAGNOSIS — E662 Morbid (severe) obesity with alveolar hypoventilation: Secondary | ICD-10-CM | POA: Insufficient documentation

## 2021-10-04 NOTE — Assessment & Plan Note (Signed)
Given excessive daytime somnolence, narrow pharyngeal exam, witnessed apneas & loud snoring, obstructive sleep apnea is very likely & an overnight polysomnogram will be scheduled as a home study. The pathophysiology of obstructive sleep apnea , it's cardiovascular consequences & modes of treatment including CPAP were discused with the patient in detail & they evidenced understanding.  Pretest probability is high and he would be willing to use CPAP if needed.  We may proceed with a formal titration study given his history of acute hypercarbic/hypoxic respiratory failure

## 2021-10-04 NOTE — Progress Notes (Signed)
Subjective:    Patient ID: Kelly Key, male    DOB: May 23, 1971, 50 y.o.   MRN: [id number]  HPI  50 year old dialysis patient presents for evaluation of sleep disordered breathing  PMH -  HFpEF -chronic diastolic heart failure,  ESRD on HD - T/T/S ,  Hypertension -on 4 medications LHC 05/2021 - non critical CAD Insulin requiring diabetes  I have reviewed his transplant evaluation at [facility].  He is accompanied by his fiance who corroborates with history.  He reports excessive daytime somnolence and apneas have been witnessed both during his hospital stay.  He had a hospitalization 04/2021 for acute hypoxic and hypercarbic respiratory failure which is attributed to fluid overload.  ABG was 7.2 8/50/79 and he required transient BiPAP  Report sleepiness score is 22 reports sleepiness even on nondialysis days.  Ellene Route reports that he is always falling asleep after he takes his medications after returning from dialysis on the couch and sometimes will sleep up to 8 PM. Bedtime can be as late as midnight, sleep latency is minimal, he sleeps on his side with 2 pillows, reports frequent nocturnal awakenings especially when his sugars fluctuate and is out of bed at 5 AM on dialysis days.  On nondialysis days he can stay in bed longer but still wakes up feeling tired and unrefreshed with dryness of mouth. There is no history suggestive of cataplexy, sleep paralysis or parasomnias  He has lost weight from 322 to his current weight of 308 pounds.  He has been advised a weight target of 278 for transplant  Medication review shows clonidine and gabapentin as sedating medications   Significant tests/ events reviewed  04/2021 ABG 7.2 8/50/79    Past Medical History:  Diagnosis Date   Acute diastolic CHF (congestive heart failure) ([place]) 06/11/2012   EF 50-55% [facility])   Chronic kidney disease    COPD (chronic obstructive pulmonary disease) (HCC)    Diabetes mellitus    A1c 11.5  (06/11/2012).   Dyspnea    Gout    Hepatic steatosis 06/11/2012   Elevated LFTs   Hyperlipemia    Malignant hypertension    Microcytic anemia 06/12/2012   Obesity     Past Surgical History:  Procedure Laterality Date   AV FISTULA PLACEMENT Left 11/11/2019   Procedure: ARTERIOVENOUS (AV) FISTULA CREATION LEFT BRACHIOCEPHALIC ARM;  Surgeon: Serafina Mitchell, MD;  Location: [place];  Service: Vascular;  Laterality: Left;   COLONOSCOPY WITH PROPOFOL N/A 09/02/2021   Procedure: COLONOSCOPY WITH PROPOFOL;  Surgeon: Jonathon Bellows, MD;  Location: [facility] ENDOSCOPY;  Service: Gastroenterology;  Laterality: N/A;   IR FLUORO GUIDE CV LINE RIGHT  07/08/2019   IR REMOVAL TUN CV CATH W/O FL  03/23/2020   IR US GUIDE VASC ACCESS RIGHT  07/08/2019   LEFT HEART CATH AND CORONARY ANGIOGRAPHY N/A 06/12/2021   Procedure: LEFT HEART CATH AND CORONARY ANGIOGRAPHY;  Surgeon: Belva Crome, MD;  Location: [place] CV LAB;  Service: Cardiovascular;  Laterality: N/A;   NO PAST SURGERIES     VASCULAR SURGERY      Allergies  Allergen Reactions   Hydralazine Anxiety    Hallucinations    Lisinopril Swelling    .soc   Family History  Problem Relation Age of Onset   Gout Mother    Asthma Mother    Diabetes Father    Heart failure Father    Diabetes Sister    Hypertension Brother    Pancreatic cancer Brother  Diabetes Sister      Review of Systems  Shortness of breath with activity Feet swelling Increased sleepiness  Constitutional: negative for anorexia, fevers and sweats  Eyes: negative for irritation, redness and visual disturbance  Ears, nose, mouth, throat, and face: negative for earaches, epistaxis, nasal congestion and sore throat  Respiratory: negative for cough,sputum and wheezing  Cardiovascular: negative for chest pain, lower extremity edema, orthopnea, palpitations and syncope  Gastrointestinal: negative for abdominal pain, constipation, diarrhea, melena, nausea and vomiting   Genitourinary:negative for dysuria, frequency and hematuria  Hematologic/lymphatic: negative for bleeding, easy bruising and lymphadenopathy  Musculoskeletal:negative for arthralgias, muscle weakness and stiff joints  Neurological: negative for coordination problems, gait problems, headaches and weakness  Endocrine: negative for diabetic symptoms including polydipsia, polyuria and weight loss     Objective:   Physical Exam  Gen. Pleasant, obese, in no distress ENT - no lesions, no post nasal drip, class 2 airway Neck: No JVD, no thyromegaly, no carotid bruits Lungs: no use of accessory muscles, no dullness to percussion, decreased without rales or rhonchi  Cardiovascular: Rhythm regular, heart sounds  normal, no murmurs or gallops, no peripheral edema Musculoskeletal: No deformities, no cyanosis or clubbing , no tremors ,LUE AVF thrill       Assessment & Plan:

## 2021-10-04 NOTE — Assessment & Plan Note (Addendum)
He had an episode of acute hypoxic/hypercarbic respiratory failure in 04/2021 that was related to fluid overload.  Given this episode of hypercarbia, he may be served well with an attended titration study  Weight loss again emphasized is the most important intervention.  Goal weight per transplant is 278 pounds

## 2021-10-04 NOTE — Patient Instructions (Signed)
Home sleep study 

## 2021-10-11 ENCOUNTER — Other Ambulatory Visit: Payer: Self-pay

## 2021-10-11 ENCOUNTER — Encounter (HOSPITAL_COMMUNITY): Payer: Self-pay | Admitting: Emergency Medicine

## 2021-10-11 ENCOUNTER — Emergency Department (HOSPITAL_COMMUNITY)
Admission: EM | Admit: 2021-10-11 | Discharge: 2021-10-11 | Disposition: A | Discharge status: Home / Self Care | Payer: Medicare Other | Attending: Emergency Medicine | Admitting: Emergency Medicine

## 2021-10-11 DIAGNOSIS — J449 Chronic obstructive pulmonary disease, unspecified: Secondary | ICD-10-CM | POA: Insufficient documentation

## 2021-10-11 DIAGNOSIS — I5042 Chronic combined systolic (congestive) and diastolic (congestive) heart failure: Secondary | ICD-10-CM | POA: Insufficient documentation

## 2021-10-11 DIAGNOSIS — Z7722 Contact with and (suspected) exposure to environmental tobacco smoke (acute) (chronic): Secondary | ICD-10-CM | POA: Insufficient documentation

## 2021-10-11 DIAGNOSIS — Z992 Dependence on renal dialysis: Secondary | ICD-10-CM | POA: Insufficient documentation

## 2021-10-11 DIAGNOSIS — N186 End stage renal disease: Secondary | ICD-10-CM | POA: Insufficient documentation

## 2021-10-11 DIAGNOSIS — E1122 Type 2 diabetes mellitus with diabetic chronic kidney disease: Secondary | ICD-10-CM | POA: Insufficient documentation

## 2021-10-11 DIAGNOSIS — Z7982 Long term (current) use of aspirin: Secondary | ICD-10-CM | POA: Insufficient documentation

## 2021-10-11 DIAGNOSIS — I132 Hypertensive heart and chronic kidney disease with heart failure and with stage 5 chronic kidney disease, or end stage renal disease: Secondary | ICD-10-CM | POA: Insufficient documentation

## 2021-10-11 DIAGNOSIS — Z79899 Other long term (current) drug therapy: Secondary | ICD-10-CM | POA: Insufficient documentation

## 2021-10-11 DIAGNOSIS — K0889 Other specified disorders of teeth and supporting structures: Secondary | ICD-10-CM | POA: Insufficient documentation

## 2021-10-11 DIAGNOSIS — Z794 Long term (current) use of insulin: Secondary | ICD-10-CM | POA: Diagnosis not present

## 2021-10-11 MED ORDER — CLINDAMYCIN HCL 300 MG PO CAPS: 300.0000 mg | ORAL_CAPSULE | Freq: Four times a day (QID) | ORAL | 0 refills | Status: DC

## 2021-10-11 MED ORDER — OXYCODONE-ACETAMINOPHEN 5-325 MG PO TABS
2.0000 | ORAL_TABLET | Freq: Once | ORAL | Status: AC
  Administered 2021-10-11: 2 via ORAL
  Filled 2021-10-11: qty 2

## 2021-10-11 MED ORDER — CLINDAMYCIN HCL 150 MG PO CAPS
300.0000 mg | ORAL_CAPSULE | Freq: Once | ORAL | Status: AC
  Administered 2021-10-11: 300 mg via ORAL
  Filled 2021-10-11: qty 2

## 2021-10-11 MED ORDER — HYDROCODONE-ACETAMINOPHEN 5-325 MG PO TABS: 1.0000 | ORAL_TABLET | Freq: Four times a day (QID) | ORAL | 0 refills | Status: DC | PRN

## 2021-10-11 NOTE — ED Provider Notes (Signed)
[facility] Provider Note   CSN: [id number] Arrival date & time: 10/11/21  0228     History Chief Complaint  Patient presents with   Dental Pain    Kelly Key is a 50 y.o. male.  Patient is a 50 year old male presenting with a 1 week history of pain to the left lower jaw.  He has been taking ibuprofen with little relief.  He reports swelling to the jaw, but no difficulty breathing or swallowing.  The history is provided by the patient.  Dental Pain Location:  Lower Lower teeth location:  19/LL 1st molar Quality:  Throbbing Severity:  Severe Onset quality:  Gradual Duration:  1 week Timing:  Constant Progression:  Worsening Chronicity:  New Relieved by:  Nothing     Past Medical History:  Diagnosis Date   Acute diastolic CHF (congestive heart failure) ([place]) 06/11/2012   EF 50-55% [facility])   Chronic kidney disease    COPD (chronic obstructive pulmonary disease) (HCC)    Diabetes mellitus    A1c 11.5 (06/11/2012).   Dyspnea    Gout    Hepatic steatosis 06/11/2012   Elevated LFTs   Hyperlipemia    Malignant hypertension    Microcytic anemia 06/12/2012   Obesity     Patient Active Problem List   Diagnosis Date Noted   OSA (obstructive sleep apnea) 10/04/2021   Obesity hypoventilation syndrome ([place]) 10/04/2021   Organic impotence 08/16/2021   NSTEMI (non-ST elevated myocardial infarction) ([place]) 05/17/2021   Mixed hyperlipidemia 08/13/2020   Acute gout of right knee 08/03/2019   Orthostasis    Muscle strain of right wrist    Pain    Physical debility 07/16/2019   ESRD (end stage renal disease) ([place])    Anemia of chronic disease    Uncontrolled type 2 diabetes mellitus with hyperglycemia (HCC)    Congestive heart failure (HCC)    Volume overload 07/05/2019   Anasarca 07/03/2019   Dyslipidemia 07/03/2019   Chronic combined systolic and diastolic CHF (congestive heart failure) ([place]) 07/03/2019   Bilateral leg edema     Uncontrolled type 2 diabetes mellitus with hyperglycemia, with long-term current use of insulin ([place]) 06/22/2019   Hypokalemia    Thromboembolism ([place]) 02/20/2019   AKI (acute kidney injury) ([place]) 02/20/2019   Acute on chronic diastolic CHF (congestive heart failure) ([place]) 03/26/2018   Uncontrolled type 2 diabetes mellitus with stage 5 chronic kidney disease 06/12/2012   Hyponatremia 06/12/2012   Microcytic anemia 06/12/2012   Essential hypertension, benign 06/11/2012   Elevated LFTs 06/11/2012   Morbid obesity ([place]) 06/11/2012   Hepatic steatosis 06/11/2012    Past Surgical History:  Procedure Laterality Date   AV FISTULA PLACEMENT Left 11/11/2019   Procedure: ARTERIOVENOUS (AV) FISTULA CREATION LEFT BRACHIOCEPHALIC ARM;  Surgeon: Serafina Mitchell, MD;  Location: [place];  Service: Vascular;  Laterality: Left;   COLONOSCOPY WITH PROPOFOL N/A 09/02/2021   Procedure: COLONOSCOPY WITH PROPOFOL;  Surgeon: Jonathon Bellows, MD;  Location: [facility] ENDOSCOPY;  Service: Gastroenterology;  Laterality: N/A;   IR FLUORO GUIDE CV LINE RIGHT  07/08/2019   IR REMOVAL TUN CV CATH W/O FL  03/23/2020   IR US GUIDE VASC ACCESS RIGHT  07/08/2019   LEFT HEART CATH AND CORONARY ANGIOGRAPHY N/A 06/12/2021   Procedure: LEFT HEART CATH AND CORONARY ANGIOGRAPHY;  Surgeon: Belva Crome, MD;  Location: [place] CV LAB;  Service: Cardiovascular;  Laterality: N/A;   NO PAST SURGERIES     VASCULAR SURGERY  Family History  Problem Relation Age of Onset   Gout Mother    Asthma Mother    Diabetes Father    Heart failure Father    Diabetes Sister    Hypertension Brother    Pancreatic cancer Brother    Diabetes Sister     Social History   Tobacco Use   Smoking status: Never    Passive exposure: Past   Smokeless tobacco: Never  Vaping Use   Vaping Use: Never used  Substance Use Topics   Alcohol use: Not Currently    Comment: haven't drank in over 5 years    Drug use: No    Home Medications Prior  to Admission medications   Medication Sig Start Date End Date Taking? Authorizing Provider  acetaminophen (TYLENOL) 500 MG tablet Take 1,000 mg by mouth every 6 (six) hours as needed for moderate pain or headache.    [provider]  albuterol (VENTOLIN HFA) 108 (90 Base) MCG/ACT inhaler Inhale 2 puffs into the lungs every 4 (four) hours as needed for wheezing or shortness of breath.  03/24/19   [provider]  allopurinol (ZYLOPRIM) 100 MG tablet Take 2 tablets (200 mg total) by mouth daily. 08/04/19   Love, Ivan Anchors, PA-C  amLODipine (NORVASC) 10 MG tablet Take 0.5 tablets (5 mg total) by mouth daily. 05/16/21 06/15/21  Deatra James, MD  aspirin EC 81 MG EC tablet Take 1 tablet (81 mg total) by mouth daily. 08/04/19   Love, Ivan Anchors, PA-C  atorvastatin (LIPITOR) 80 MG tablet Take 1 tablet (80 mg total) by mouth daily. 12/05/19   Arnoldo Lenis, MD  Calcium Carbonate Antacid (CALCIUM CARBONATE, DOSED IN MG ELEMENTAL CALCIUM,) 1250 MG/5ML SUSP Take 5 mLs (500 mg of elemental calcium total) by mouth every 6 (six) hours as needed for indigestion. 07/16/19   Amin, Jeanella Flattery, MD  carvedilol (COREG) 25 MG tablet Take 1 tablet (25 mg total) by mouth 2 (two) times daily with a meal. 08/03/19   Love, Ivan Anchors, PA-C  cloNIDine (CATAPRES) 0.3 MG tablet Take 0.3 mg by mouth 3 (three) times daily.    [provider]  diclofenac sodium (VOLTAREN) 1 % GEL Apply 2 g topically 4 (four) times daily. 08/03/19   Love, Ivan Anchors, PA-C  ezetimibe (ZETIA) 10 MG tablet Take 10 mg by mouth every evening. 01/20/19   [provider]  fluticasone (FLONASE) 50 MCG/ACT nasal spray Place 1 spray into both nostrils daily as needed for allergies or rhinitis.    [provider]  furosemide (LASIX) 80 MG tablet Take 1 tablet (80 mg total) by mouth daily. 05/16/21 06/15/21  Deatra James, MD  gabapentin (NEURONTIN) 100 MG capsule Take 1 capsule (100 mg total) by mouth 3 (three) times  daily. 08/03/19   Love, Ivan Anchors, PA-C  glucose blood (ACCU-CHEK AVIVA PLUS) test strip Use as instructed TO CHECK BLOOD GLUCOSE THREE TIMES DAILY 08/15/20   Nida, Marella Chimes, MD  HYDROcodone-acetaminophen (NORCO/VICODIN) 5-325 MG tablet Take 1 tablet by mouth every 6 (six) hours as needed for severe pain. 11/11/19   Dagoberto Ligas, PA-C  Insulin Pen Needle (B-D ULTRAFINE III SHORT PEN) 31G X 8 MM MISC 1 each by Does not apply route as directed. 08/13/20   Cassandria Anger, MD  LEVEMIR FLEXTOUCH 100 UNIT/ML FlexTouch Pen INJECT 50 UNITS SUBCUTANEOUSLY AT BEDTIME 07/09/21   Brita Romp, NP  linaclotide [facility]) 145 MCG CAPS capsule Take 1 capsule (145 mcg total)  by mouth daily before breakfast. 04/05/18   Manuella Ghazi, Pratik D, DO  multivitamin (RENA-VIT) TABS tablet Take 1 tablet by mouth at bedtime. 08/03/19   Love, Ivan Anchors, PA-C  Na Sulfate-K Sulfate-Mg Sulf (SUPREP BOWEL PREP KIT) 17.5-3.13-1.6 GM/177ML SOLN Take 1 kit by mouth as directed. 04/11/21   Lucilla Lame, MD  NOVOLOG FLEXPEN 100 UNIT/ML FlexPen INJECT 8 TO 14 UNITS SUBCUTANEOUSLY THREE TIMES DAILY WITH MEALS 07/09/21   Brita Romp, NP  oxyCODONE-acetaminophen (PERCOCET) 5-325 MG tablet Take 1-2 tablets by mouth every 6 (six) hours as needed. 04/24/21   Veryl Speak, MD  OZEMPIC, 0.25 OR 0.5 MG/DOSE, 2 MG/1.5ML SOPN Inject 0.25 mg into the skin every Friday. 11/23/20   [provider]  sevelamer carbonate (RENVELA) 800 MG tablet Take 3 tablets (2,400 mg total) by mouth 3 (three) times daily with meals. 08/03/19   Love, Ivan Anchors, PA-C  valACYclovir (VALTREX) 1000 MG tablet Take 1 tablet (1,000 mg total) by mouth 3 (three) times daily. 04/24/21   Veryl Speak, MD  vardenafil (LEVITRA) 20 MG tablet Take 1 tablet (20 mg total) by mouth daily as needed for erectile dysfunction. 08/16/21   Stone[place], Reece Leader., MD    Allergies    Hydralazine and Lisinopril  Review of Systems   Review of Systems  All other systems  reviewed and are negative.  Physical Exam Updated Vital Signs BP 136/83    Pulse 94    Temp 97.9 F (36.6 C)    Resp 20    Ht _0  (1.753 m)    Wt 136.1 kg    SpO2 97%    BMI 44.30 kg/m   Physical Exam Vitals and nursing note reviewed.  Constitutional:      General: He is not in acute distress.    Appearance: Normal appearance. He is not ill-appearing.  HENT:     Head: Normocephalic and atraumatic.     Mouth/Throat:     Mouth: Mucous membranes are moist.     Comments: There is tenderness to palpation of the left lower jaw.  He has multiple molars with decay and receding gums.  There is no obvious abscess.  There is no trismus. Pulmonary:     Effort: Pulmonary effort is normal.  Neurological:     Mental Status: He is alert.    ED Results / Procedures / Treatments   Labs (all labs ordered are listed, but only abnormal results are displayed) Labs Reviewed - No data to display  EKG None  Radiology No results found.  Procedures Procedures   Medications Ordered in ED Medications  oxyCODONE-acetaminophen (PERCOCET/ROXICET) 5-325 MG per tablet 2 tablet (has no administration in time range)  clindamycin (CLEOCIN) capsule 300 mg (has no administration in time range)    ED Course  I have reviewed the triage vital signs and the nursing notes.  Pertinent labs & imaging results that were available during my care of the patient were reviewed by me and considered in my medical decision making (see chart for details).    MDM Rules/Calculators/A&P  Patient with dental pain/infection.  To be treated with clindamycin and pain medication.  To follow-up with dentistry in the next week.  Final Clinical Impression(s) / ED Diagnoses Final diagnoses:  None    Rx / DC Orders ED Discharge Orders     None        Veryl Speak, MD 10/11/21 [phone]

## 2021-10-11 NOTE — Discharge Instructions (Addendum)
Begin taking clindamycin as prescribed.  Continue ibuprofen 600 mg every 6 hours as needed for pain.  Begin taking hydrocodone as prescribed as needed for pain not relieved with ibuprofen.  Follow-up with your dentist if not improving in the next 4 to 5 days.

## 2021-10-11 NOTE — ED Triage Notes (Signed)
Pt c/o left lower dental pain all day.

## 2021-10-17 ENCOUNTER — Telehealth: Payer: Self-pay

## 2021-10-17 NOTE — Telephone Encounter (Signed)
Inbound call from pt requesting to schedule his colonoscopy. Thank you.

## 2021-10-18 NOTE — Telephone Encounter (Signed)
Returned patients call. LVM to call office back. 

## 2021-11-04 ENCOUNTER — Ambulatory Visit: Payer: Medicare Other | Admitting: Urology

## 2021-11-04 ENCOUNTER — Other Ambulatory Visit (HOSPITAL_COMMUNITY): Payer: Self-pay | Admitting: Podiatry

## 2021-11-04 ENCOUNTER — Other Ambulatory Visit: Payer: Self-pay | Admitting: Podiatry

## 2021-11-04 DIAGNOSIS — M869 Osteomyelitis, unspecified: Secondary | ICD-10-CM

## 2021-11-04 NOTE — Progress Notes (Deleted)
Assessment: 1. Organic impotence      Plan: Today I had a long discussion with the patient spending a total of 30 minutes discussing ED.  I discussed the pathophysiology, etiology, and natural history of ED as well as management options using a goal-oriented approach.  We discussed the following options: Medical therapy, vacuum erection device, penile injections, intraurethral suppository therapy (MUSE), and penile prosthesis. Patient educational materials concerning these options were given to the patient.  Rx for Levitra 20 mg provided Return to office in 6-8 weeks  I personally spent 45 minutes involved in face to face and non-face-to-face activities for this patient on the day of the visit.  Professional time spent included the following activities, in addition to those noted in the documentation: Review of patient's medical records, discussion of potential causes of erectile dysfunction specifically as it pertains to his medical history, and a lengthy discussion of management options.   Chief Complaint:  No chief complaint on file.  History of Present Illness:  Kelly Key is a 51 y.o. year old male who is seen for further evaluation of erectile dysfunction.  At his initial visit in 10/22, he reported symptoms have been present for approximately 1 year with gradual worsening.  He is able to achieve a partial erection with 50% rigidity but has rapid detumescence.  He has unable to use his erections for intercourse.  No nocturnal or early morning erections.  No decrease in his libido.  No pain or curvature with erections.  He has previously tried Viagra 100 mg without benefit.  He has also tried some over-the-counter medications without benefit. He is on hemodialysis for end-stage renal disease.  He voids approximately twice a day.  He is not having any significant voiding symptoms at this time. He was given a prescription for Levitra at his visit in 10/22.  Portions of the above  documentation were copied from a prior visit for review purposes only.  Past Medical History:  Past Medical History:  Diagnosis Date   Acute diastolic CHF (congestive heart failure) ([place]) 06/11/2012   EF 50-55% [facility])   Chronic kidney disease    COPD (chronic obstructive pulmonary disease) ([facility])    Diabetes mellitus    A1c 11.5 (06/11/2012).   Dyspnea    Gout    Hepatic steatosis 06/11/2012   Elevated LFTs   Hyperlipemia    Malignant hypertension    Microcytic anemia 06/12/2012   Obesity     Past Surgical History:  Past Surgical History:  Procedure Laterality Date   AV FISTULA PLACEMENT Left 11/11/2019   Procedure: ARTERIOVENOUS (AV) FISTULA CREATION LEFT BRACHIOCEPHALIC ARM;  Surgeon: Serafina Mitchell, MD;  Location: [place];  Service: Vascular;  Laterality: Left;   COLONOSCOPY WITH PROPOFOL N/A 09/02/2021   Procedure: COLONOSCOPY WITH PROPOFOL;  Surgeon: Jonathon Bellows, MD;  Location: [facility] ENDOSCOPY;  Service: Gastroenterology;  Laterality: N/A;   IR FLUORO GUIDE CV LINE RIGHT  07/08/2019   IR REMOVAL TUN CV CATH W/O FL  03/23/2020   IR US GUIDE VASC ACCESS RIGHT  07/08/2019   LEFT HEART CATH AND CORONARY ANGIOGRAPHY N/A 06/12/2021   Procedure: LEFT HEART CATH AND CORONARY ANGIOGRAPHY;  Surgeon: Belva Crome, MD;  Location: [place] CV LAB;  Service: Cardiovascular;  Laterality: N/A;   NO PAST SURGERIES     VASCULAR SURGERY      Allergies:  Allergies  Allergen Reactions   Hydralazine Anxiety    Hallucinations    Lisinopril Swelling  Family History:  Family History  Problem Relation Age of Onset   Gout Mother    Asthma Mother    Diabetes Father    Heart failure Father    Diabetes Sister    Hypertension Brother    Pancreatic cancer Brother    Diabetes Sister     Social History:  Social History   Tobacco Use   Smoking status: Never    Passive exposure: Past   Smokeless tobacco: Never  Vaping Use   Vaping Use: Never used  Substance Use Topics    Alcohol use: Not Currently    Comment: haven't drank in over 5 years    Drug use: No    ROS: Constitutional:  Negative for fever, chills, weight loss CV: Negative for chest pain, previous MI, hypertension Respiratory:  Negative for shortness of breath, wheezing, sleep apnea, frequent cough GI:  Negative for nausea, vomiting, bloody stool, GERD  Physical exam: There were no vitals taken for this visit. ***  Results: No specimen obtained

## 2021-11-06 ENCOUNTER — Encounter (HOSPITAL_COMMUNITY): Payer: Self-pay

## 2021-11-06 ENCOUNTER — Emergency Department (HOSPITAL_COMMUNITY)
Admission: RE | Admit: 2021-11-06 | Discharge: 2021-11-06 | Disposition: A | Discharge status: Home / Self Care | Payer: Commercial Managed Care - HMO | Source: Ambulatory Visit | Attending: Podiatry | Admitting: Podiatry

## 2021-11-06 ENCOUNTER — Other Ambulatory Visit: Payer: Self-pay

## 2021-11-06 ENCOUNTER — Emergency Department (HOSPITAL_COMMUNITY)
Admission: EM | Admit: 2021-11-06 | Discharge: 2021-11-06 | Discharge status: Absent without leave | Payer: Commercial Managed Care - HMO

## 2021-11-06 ENCOUNTER — Emergency Department (HOSPITAL_COMMUNITY)
Admission: EM | Admit: 2021-11-06 | Discharge: 2021-11-06 | Disposition: A | Discharge status: Home / Self Care | Payer: Commercial Managed Care - HMO | Attending: Emergency Medicine | Admitting: Emergency Medicine

## 2021-11-06 DIAGNOSIS — M869 Osteomyelitis, unspecified: Secondary | ICD-10-CM

## 2021-11-06 DIAGNOSIS — G8929 Other chronic pain: Secondary | ICD-10-CM | POA: Insufficient documentation

## 2021-11-06 DIAGNOSIS — E1122 Type 2 diabetes mellitus with diabetic chronic kidney disease: Secondary | ICD-10-CM | POA: Insufficient documentation

## 2021-11-06 DIAGNOSIS — Z7982 Long term (current) use of aspirin: Secondary | ICD-10-CM | POA: Insufficient documentation

## 2021-11-06 DIAGNOSIS — N189 Chronic kidney disease, unspecified: Secondary | ICD-10-CM | POA: Diagnosis not present

## 2021-11-06 DIAGNOSIS — Z992 Dependence on renal dialysis: Secondary | ICD-10-CM | POA: Diagnosis not present

## 2021-11-06 DIAGNOSIS — Z794 Long term (current) use of insulin: Secondary | ICD-10-CM | POA: Insufficient documentation

## 2021-11-06 DIAGNOSIS — M79675 Pain in left toe(s): Secondary | ICD-10-CM | POA: Insufficient documentation

## 2021-11-06 MED ORDER — HYDROCODONE-ACETAMINOPHEN 5-325 MG PO TABS: 1.0000 | ORAL_TABLET | Freq: Four times a day (QID) | ORAL | 0 refills | Status: DC | PRN

## 2021-11-06 NOTE — Discharge Instructions (Signed)
Continue going to your dialysis treatments.  They will continue giving antibiotics there.  Keep your appoint with your foot doctor as planned and he will discuss further treatment

## 2021-11-06 NOTE — ED Triage Notes (Signed)
Patient with complaints of left middle toe pain. Patient is being treated for infection and had an MRI of the affected foot this morning.

## 2021-11-06 NOTE — ED Notes (Signed)
Clean, dry dressing applied to left 3rd toe. Pt verbalizes understanding of all discharge instructions.

## 2021-11-06 NOTE — ED Provider Notes (Signed)
[facility] Provider Note   CSN: [id number] Arrival date & time: 11/06/21  0749     History  Chief Complaint  Patient presents with   Toe Pain    Kelly Key is a 51 y.o. male.  Patient complains of left middle toe pain.  He got an MRI this morning.  He is in the care of a podiatrist who has been giving him IV antibiotics when he gets dialysis 3 times a week.  Patient just wants a second opinion of his left middle toe.  He also wants some pain medicine.  Patient is a dialysis patient and if diabetic  The history is provided by the patient and medical records. No language interpreter was used.  Toe Pain This is a recurrent problem. The current episode started more than 1 week ago. The problem occurs constantly. The problem has not changed since onset.Pertinent negatives include no chest pain, no abdominal pain and no headaches. Exacerbated by: Ambulate. Nothing relieves the symptoms. He has tried nothing for the symptoms.      Home Medications Prior to Admission medications   Medication Sig Start Date End Date Taking? Authorizing Provider  HYDROcodone-acetaminophen (NORCO/VICODIN) 5-325 MG tablet Take 1 tablet by mouth every 6 (six) hours as needed for moderate pain. 11/06/21  Yes Milton Ferguson, MD  acetaminophen (TYLENOL) 500 MG tablet Take 1,000 mg by mouth every 6 (six) hours as needed for moderate pain or headache.    [provider]  albuterol (VENTOLIN HFA) 108 (90 Base) MCG/ACT inhaler Inhale 2 puffs into the lungs every 4 (four) hours as needed for wheezing or shortness of breath.  03/24/19   [provider]  allopurinol (ZYLOPRIM) 100 MG tablet Take 2 tablets (200 mg total) by mouth daily. 08/04/19   Love, Ivan Anchors, PA-C  amLODipine (NORVASC) 10 MG tablet Take 0.5 tablets (5 mg total) by mouth daily. 05/16/21 06/15/21  Deatra James, MD  aspirin EC 81 MG EC tablet Take 1 tablet (81 mg total) by mouth daily. 08/04/19   Love, Ivan Anchors,  PA-C  atorvastatin (LIPITOR) 80 MG tablet Take 1 tablet (80 mg total) by mouth daily. 12/05/19   Arnoldo Lenis, MD  Calcium Carbonate Antacid (CALCIUM CARBONATE, DOSED IN MG ELEMENTAL CALCIUM,) 1250 MG/5ML SUSP Take 5 mLs (500 mg of elemental calcium total) by mouth every 6 (six) hours as needed for indigestion. 07/16/19   Amin, Jeanella Flattery, MD  carvedilol (COREG) 25 MG tablet Take 1 tablet (25 mg total) by mouth 2 (two) times daily with a meal. 08/03/19   Love, Ivan Anchors, PA-C  clindamycin (CLEOCIN) 300 MG capsule Take 1 capsule (300 mg total) by mouth 4 (four) times daily. X 7 days 10/11/21   Veryl Speak, MD  cloNIDine (CATAPRES) 0.3 MG tablet Take 0.3 mg by mouth 3 (three) times daily.    [provider]  diclofenac sodium (VOLTAREN) 1 % GEL Apply 2 g topically 4 (four) times daily. 08/03/19   Love, Ivan Anchors, PA-C  ezetimibe (ZETIA) 10 MG tablet Take 10 mg by mouth every evening. 01/20/19   [provider]  fluticasone (FLONASE) 50 MCG/ACT nasal spray Place 1 spray into both nostrils daily as needed for allergies or rhinitis.    [provider]  furosemide (LASIX) 80 MG tablet Take 1 tablet (80 mg total) by mouth daily. 05/16/21 06/15/21  ShahmehdiValeria Batman, MD  gabapentin (NEURONTIN) 100 MG capsule Take 1 capsule (100 mg total) by mouth 3 (  three) times daily. 08/03/19   Love, Ivan Anchors, PA-C  glucose blood (ACCU-CHEK AVIVA PLUS) test strip Use as instructed TO CHECK BLOOD GLUCOSE THREE TIMES DAILY 08/15/20   Nida, Marella Chimes, MD  Insulin Pen Needle (B-D ULTRAFINE III SHORT PEN) 31G X 8 MM MISC 1 each by Does not apply route as directed. 08/13/20   Cassandria Anger, MD  LEVEMIR FLEXTOUCH 100 UNIT/ML FlexTouch Pen INJECT 50 UNITS SUBCUTANEOUSLY AT BEDTIME 07/09/21   Brita Romp, NP  linaclotide [facility]) 145 MCG CAPS capsule Take 1 capsule (145 mcg total) by mouth daily before breakfast. 04/05/18   Manuella Ghazi, Pratik D, DO  multivitamin (RENA-VIT) TABS tablet Take 1  tablet by mouth at bedtime. 08/03/19   Love, Ivan Anchors, PA-C  Na Sulfate-K Sulfate-Mg Sulf (SUPREP BOWEL PREP KIT) 17.5-3.13-1.6 GM/177ML SOLN Take 1 kit by mouth as directed. 04/11/21   Lucilla Lame, MD  NOVOLOG FLEXPEN 100 UNIT/ML FlexPen INJECT 8 TO 14 UNITS SUBCUTANEOUSLY THREE TIMES DAILY WITH MEALS 07/09/21   Brita Romp, NP  oxyCODONE-acetaminophen (PERCOCET) 5-325 MG tablet Take 1-2 tablets by mouth every 6 (six) hours as needed. 04/24/21   Veryl Speak, MD  OZEMPIC, 0.25 OR 0.5 MG/DOSE, 2 MG/1.5ML SOPN Inject 0.25 mg into the skin every Friday. 11/23/20   [provider]  sevelamer carbonate (RENVELA) 800 MG tablet Take 3 tablets (2,400 mg total) by mouth 3 (three) times daily with meals. 08/03/19   Love, Ivan Anchors, PA-C  valACYclovir (VALTREX) 1000 MG tablet Take 1 tablet (1,000 mg total) by mouth 3 (three) times daily. 04/24/21   Veryl Speak, MD  vardenafil (LEVITRA) 20 MG tablet Take 1 tablet (20 mg total) by mouth daily as needed for erectile dysfunction. 08/16/21   Stoneking, Reece Leader., MD      Allergies    Hydralazine and Lisinopril    Review of Systems   Review of Systems  Constitutional:  Negative for appetite change and fatigue.  HENT:  Negative for congestion, ear discharge and sinus pressure.   Eyes:  Negative for discharge.  Respiratory:  Negative for cough.   Cardiovascular:  Negative for chest pain.  Gastrointestinal:  Negative for abdominal pain and diarrhea.  Genitourinary:  Negative for frequency and hematuria.  Musculoskeletal:  Negative for back pain.       Left middle toe pain  Skin:  Negative for rash.  Neurological:  Negative for seizures and headaches.  Psychiatric/Behavioral:  Negative for hallucinations.    Physical Exam Updated Vital Signs Ht _0  (1.753 m)    Wt 136.1 kg    SpO2 97%    BMI 44.30 kg/m  Physical Exam Vitals reviewed.  Constitutional:      Appearance: He is well-developed.  HENT:     Head: Normocephalic.     Nose:  Nose normal.  Eyes:     Conjunctiva/sclera: Conjunctivae normal.  Neck:     Trachea: No tracheal deviation.  Cardiovascular:     Rate and Rhythm: Normal rate.  Pulmonary:     Effort: Pulmonary effort is normal.  Musculoskeletal:        General: Normal range of motion.     Cervical back: Normal range of motion.     Comments: Left middle toe mildly swollen with a small ulcer at the end that looks clean.  No significant infection seen.  Skin:    General: Skin is warm.  Neurological:     Mental Status: He is alert and oriented to person, place, and  time.    ED Results / Procedures / Treatments   Labs (all labs ordered are listed, but only abnormal results are displayed) Labs Reviewed - No data to display  EKG None  Radiology No results found.  Procedures Procedures    Medications Ordered in ED Medications - No data to display  ED Course/ Medical Decision Making/ A&P                           Medical Decision Making  Patient with infected left middle toe.  He was taking care of it by the podiatrist and getting IV antibiotics 3 days a week.  He is here for pain control.  Patient's toe seems to be doing well.  He will be discharged home with Vicodin and will follow back up as planned with his doctor and get his dialysis treatments as planned with his IV antibiotic   This patient presents to the ED for concern of toe pain, this involves an extensive number of treatment options, and is a complaint that carries with it a high risk of complications and morbidity.  The differential diagnosis includes cellulitis in the toe, osteomyelitis   Co morbidities that complicate the patient evaluation  Diabetes/kidney disease getting dialysis   Additional history obtained:  Additional history obtained from patient and wife External records from outside source obtained and reviewed including hospital records   Lab Tests:  No labs ordered  Imaging Studies ordered:  No imaging  ordered  Cardiac Monitoring: No cardiac monitor done  Medicines ordered and prescription drug management:  I ordered medication including Vicodin for pain Reevaluation of the patient after these medicines showed that the patient stayed the same I have reviewed the patients home medicines and have made adjustments as needed   Test Considered:  C-Met   Critical Interventions:  None   Consultations Obtained:  None consultants  Problem List / ED Course:  Infected left middle toe   Reevaluation:  After the interventions noted above, I reevaluated the patient and found that they have :stayed the same   Social Determinants of Health:  Patient is a dialysis patient   Dispostion:  After consideration of the diagnostic results and the patients response to treatment, I feel that the patent would benefit from discharge home with follow-up with his podiatrist who is getting an MRI and also has him on IV antibiotics 3 times a week in dialysis.  Patient's toe looks good and is healing well.          Final Clinical Impression(s) / ED Diagnoses Final diagnoses:  Toe pain, chronic, unspecified laterality    Rx / DC Orders ED Discharge Orders          Ordered    HYDROcodone-acetaminophen (NORCO/VICODIN) 5-325 MG tablet  Every 6 hours PRN        11/06/21 0830              Milton Ferguson, MD 11/06/21 [phone]

## 2021-11-13 ENCOUNTER — Other Ambulatory Visit: Payer: Self-pay

## 2021-11-13 ENCOUNTER — Encounter: Payer: Self-pay | Admitting: Internal Medicine

## 2021-11-13 ENCOUNTER — Ambulatory Visit (INDEPENDENT_AMBULATORY_CARE_PROVIDER_SITE_OTHER): Payer: Commercial Managed Care - HMO | Admitting: Internal Medicine

## 2021-11-13 ENCOUNTER — Telehealth: Payer: Self-pay

## 2021-11-13 VITALS — BP 125/85 | HR 85 | Wt 303.0 lb

## 2021-11-13 DIAGNOSIS — M869 Osteomyelitis, unspecified: Secondary | ICD-10-CM | POA: Insufficient documentation

## 2021-11-13 DIAGNOSIS — N186 End stage renal disease: Secondary | ICD-10-CM

## 2021-11-13 DIAGNOSIS — E1165 Type 2 diabetes mellitus with hyperglycemia: Secondary | ICD-10-CM

## 2021-11-13 DIAGNOSIS — M86672 Other chronic osteomyelitis, left ankle and foot: Secondary | ICD-10-CM | POA: Diagnosis not present

## 2021-11-13 NOTE — Telephone Encounter (Signed)
Per MD faxed orders to [facility] to start patient on Vancomycin and Cefepime. Spoke with Aniceto Boss, RN who requested orders be faxed. Received confirmation that fax went through.  " Vancoymin 2500 mg loading dose then 1000 mg Cefepime 2 gm IV. X6 weeks pose HD session" P: [phone] F: Lamar, RMA

## 2021-11-13 NOTE — Patient Instructions (Signed)
Thank you for coming to see me today. It was a pleasure seeing you.  To Do: Stop taking your oral antibiotics Start getting antibiotics with dialysis.  We will call them to get them ordered. Follow up with me in 6 weeks Keep seeing your foot doctor as well  If you have any questions or concerns, please do not hesitate to call the office at [phone].  Take Care,   Jule Ser

## 2021-11-13 NOTE — Assessment & Plan Note (Signed)
Discussed with patient clinical and radiographic findings of OM involving the left 3rd toe.  I have recommended definitive source control with amputation for this issue, however, he declines and wants to attempt to salvage his toe.  Discussed further that cornerstone of treatment for OM is debridement and/or amputation of osteomyelitic bone and antibiotics alone to deal with this type of infection is challenging.  Also stressed importance of adequate peripheral blood flow, glycemic control, wound care, and off loading in addition to antibiotics.  We talked further about IV vs PO antibiotics and emerging data to suggest equivalency.  He is fairly fixed in belief that he needs IV.    Patient will continue to follow up with podiatry and will start patient on cefepime 2gm IV and vancomcyin 2518m loading dose then 10050mper pharmacy recommendations dosed with HD for 6 weeks.  We will notify his HD center of antibiotics.  Will check baseline ESR, CRP.  Check A1c.  Follow up in 6 weeks.

## 2021-11-13 NOTE — Telephone Encounter (Signed)
Thank you :)

## 2021-11-13 NOTE — Progress Notes (Signed)
[place] for Infectious Disease  Reason for Consult: Left foot osteomyelitis  Referring Provider: Dr Posey Pronto (Podiatry)   HPI:    Kelly Key is a 51 y.o. male with PMHx as below who presents to the clinic for left foot osteomyelitis.    Patient was referred to ID by his podiatrist whom has been managing a left foot ulcer for patient over the past several weeks.  Kelly Key was seen recently on 11/11/21 by Dr Posey Pronto.  At that visit Kelly Key had a documented 0.8x1.3x0.2cm ulcer with fibrogranular wound base.  There was associated purulent drainage with soft tissue crepitus and the ulcer probed to bone.  Discussion with patient was had by his podiatrist regarding amputation which Kelly Key is currently opposed to.  His podiatrist discussed IV antibiotics with patient.  Kelly Key is currently on HD x 3 days per week at [facility] in [place] [phone]).  Patient also had an MRI on 11/06/21 that confirmed osteomyelitis of his left 3rd toe.  It appears Kelly Key has been on several oral antibiotics for this process and most recently prescribed amoxicillin.  Kelly Key states Kelly Key was also started on Vancomycin last week.  Of note, Kelly Key has uncontrolled DM with recent A1c in July of 8.2.  Kelly Key did have ABI's completed in January 2022 with ABI on the left normal at 1.04.    Patient's Medications  New Prescriptions   No medications on file  Previous Medications   ACETAMINOPHEN (TYLENOL) 500 MG TABLET    Take 1,000 mg by mouth every 6 (six) hours as needed for moderate pain or headache.   ALBUTEROL (VENTOLIN HFA) 108 (90 BASE) MCG/ACT INHALER    Inhale 2 puffs into the lungs every 4 (four) hours as needed for wheezing or shortness of breath.    ALLOPURINOL (ZYLOPRIM) 100 MG TABLET    Take 2 tablets (200 mg total) by mouth daily.   AMLODIPINE (NORVASC) 10 MG TABLET    Take 0.5 tablets (5 mg total) by mouth daily.   ASPIRIN EC 81 MG EC TABLET    Take 1 tablet (81 mg total) by mouth daily.   ATORVASTATIN (LIPITOR) 80 MG TABLET    Take 1 tablet  (80 mg total) by mouth daily.   CALCIUM CARBONATE ANTACID (CALCIUM CARBONATE, DOSED IN MG ELEMENTAL CALCIUM,) 1250 MG/5ML SUSP    Take 5 mLs (500 mg of elemental calcium total) by mouth every 6 (six) hours as needed for indigestion.   CARVEDILOL (COREG) 25 MG TABLET    Take 1 tablet (25 mg total) by mouth 2 (two) times daily with a meal.   CLINDAMYCIN (CLEOCIN) 300 MG CAPSULE    Take 1 capsule (300 mg total) by mouth 4 (four) times daily. X 7 days   CLONIDINE (CATAPRES) 0.3 MG TABLET    Take 0.3 mg by mouth 3 (three) times daily.   DICLOFENAC SODIUM (VOLTAREN) 1 % GEL    Apply 2 g topically 4 (four) times daily.   EZETIMIBE (ZETIA) 10 MG TABLET    Take 10 mg by mouth every evening.   FLUTICASONE (FLONASE) 50 MCG/ACT NASAL SPRAY    Place 1 spray into both nostrils daily as needed for allergies or rhinitis.   FUROSEMIDE (LASIX) 80 MG TABLET    Take 1 tablet (80 mg total) by mouth daily.   GABAPENTIN (NEURONTIN) 100 MG CAPSULE    Take 1 capsule (100 mg total) by mouth 3 (three) times daily.   GLUCOSE BLOOD (ACCU-CHEK AVIVA PLUS)  TEST STRIP    Use as instructed TO CHECK BLOOD GLUCOSE THREE TIMES DAILY   HYDROCODONE-ACETAMINOPHEN (NORCO/VICODIN) 5-325 MG TABLET    Take 1 tablet by mouth every 6 (six) hours as needed for moderate pain.   INSULIN PEN NEEDLE (B-D ULTRAFINE III SHORT PEN) 31G X 8 MM MISC    1 each by Does not apply route as directed.   LEVEMIR FLEXTOUCH 100 UNIT/ML FLEXTOUCH PEN    INJECT 50 UNITS SUBCUTANEOUSLY AT BEDTIME   LINACLOTIDE (LINZESS) 145 MCG CAPS CAPSULE    Take 1 capsule (145 mcg total) by mouth daily before breakfast.   MULTIVITAMIN (RENA-VIT) TABS TABLET    Take 1 tablet by mouth at bedtime.   NA SULFATE-K SULFATE-MG SULF (SUPREP BOWEL PREP KIT) 17.5-3.13-1.6 GM/177ML SOLN    Take 1 kit by mouth as directed.   NOVOLOG FLEXPEN 100 UNIT/ML FLEXPEN    INJECT 8 TO 14 UNITS SUBCUTANEOUSLY THREE TIMES DAILY WITH MEALS   OXYCODONE-ACETAMINOPHEN (PERCOCET) 5-325 MG TABLET    Take  1-2 tablets by mouth every 6 (six) hours as needed.   OZEMPIC, 0.25 OR 0.5 MG/DOSE, 2 MG/1.5ML SOPN    Inject 0.25 mg into the skin every Friday.   SEVELAMER CARBONATE (RENVELA) 800 MG TABLET    Take 3 tablets (2,400 mg total) by mouth 3 (three) times daily with meals.   VALACYCLOVIR (VALTREX) 1000 MG TABLET    Take 1 tablet (1,000 mg total) by mouth 3 (three) times daily.   VARDENAFIL (LEVITRA) 20 MG TABLET    Take 1 tablet (20 mg total) by mouth daily as needed for erectile dysfunction.  Modified Medications   No medications on file  Discontinued Medications   No medications on file      Past Medical History:  Diagnosis Date   Acute diastolic CHF (congestive heart failure) ([place]) 06/11/2012   EF 50-55% [facility])   Chronic kidney disease    COPD (chronic obstructive pulmonary disease) (HCC)    Diabetes mellitus    A1c 11.5 (06/11/2012).   Dyspnea    Gout    Hepatic steatosis 06/11/2012   Elevated LFTs   Hyperlipemia    Malignant hypertension    Microcytic anemia 06/12/2012   Obesity     Social History   Tobacco Use   Smoking status: Never    Passive exposure: Past   Smokeless tobacco: Never  Vaping Use   Vaping Use: Never used  Substance Use Topics   Alcohol use: Not Currently    Comment: haven't drank in over 5 years    Drug use: No    Family History  Problem Relation Age of Onset   Gout Mother    Asthma Mother    Diabetes Father    Heart failure Father    Diabetes Sister    Hypertension Brother    Pancreatic cancer Brother    Diabetes Sister     Allergies  Allergen Reactions   Hydralazine Anxiety    Hallucinations    Lisinopril Swelling    Review of Systems  Constitutional: Negative.   Respiratory: Negative.    Cardiovascular: Negative.   Gastrointestinal: Negative.   Musculoskeletal:  Positive for joint pain.     OBJECTIVE:    Vitals:   11/13/21 1338  BP: 125/85  Pulse: 85  SpO2: 97%  Weight: (!) 303 lb (137.4 kg)     Body mass  index is 44.75 kg/m.  Physical Exam Constitutional:      Appearance: Normal appearance. Kelly Key  is obese.  HENT:     Head: Normocephalic and atraumatic.  Eyes:     Extraocular Movements: Extraocular movements intact.     Conjunctiva/sclera: Conjunctivae normal.  Pulmonary:     Effort: Pulmonary effort is normal. No respiratory distress.  Abdominal:     General: There is no distension.     Palpations: Abdomen is soft.     Tenderness: There is no abdominal tenderness.  Skin:    General: Skin is warm and dry.     Comments: Left 3rd toe ulcer noted.  Neurological:     General: No focal deficit present.     Mental Status: Kelly Key is alert and oriented to person, place, and time.  Psychiatric:        Mood and Affect: Mood normal.        Behavior: Behavior normal.      Labs and Microbiology:  CBC Latest Ref Rng & Units 06/10/2021 05/18/2021 05/18/2021  WBC 3.4 - 10.8 x10E3/uL 7.3 5.9 TEST CANCELLED PER RN  Hemoglobin 13.0 - 17.7 g/dL 10.5(L) 9.8(L) TEST CANCELLED PER RN  Hematocrit 37.5 - 51.0 % 31.5(L) 30.9(L) TEST CANCELLED PER RN  Platelets 150 - 450 x10E3/uL 310 265 TEST CANCELLED PER RN   CMP Latest Ref Rng & Units 06/10/2021 05/18/2021 05/18/2021  Glucose 65 - 99 mg/dL 97 125(H) TEST CANCELLED PER RN  BUN 6 - 24 mg/dL 66(H) 65(H) TEST CANCELLED PER RN  Creatinine 0.76 - 1.27 mg/dL 9.41(H) 8.66(H) TEST CANCELLED PER RN  Sodium 134 - 144 mmol/L 137 134(L) TEST CANCELLED PER RN  Potassium 3.5 - 5.2 mmol/L 4.9 4.1 TEST CANCELLED PER RN  Chloride 96 - 106 mmol/L 95(L) 96(L) TEST CANCELLED PER RN  CO2 20 - 29 mmol/L 24 25 TEST CANCELLED PER RN  Calcium 8.7 - 10.2 mg/dL 9.0 8.4(L) TEST CANCELLED PER RN  Total Protein 6.5 - 8.1 g/dL - 6.3(L) TEST CANCELLED PER RN  Total Bilirubin 0.3 - 1.2 mg/dL - 0.6 TEST CANCELLED PER RN  Alkaline Phos 38 - 126 U/L - 45 TEST CANCELLED PER RN  AST 15 - 41 U/L - 13(L) TEST CANCELLED PER RN  ALT 0 - 44 U/L - 16 TEST CANCELLED PER RN     No results found  for this or any previous visit (from the past 240 hour(s)).  Imaging: MRI Left foot 11/06/21  IMPRESSION: 1. Findings are consistent with osteomyelitis of the 3rd toe, involving at least the middle and distal phalanges. There is nonspecific mild marrow T2 hyperintensity within the head of the proximal phalanx. 2. Associated soft tissue ulceration and inflammation. 3. Nonspecific mild T2 hyperintensity within the distal 1st phalanx without definite T1 signal abnormality, cortical destruction or surrounding soft tissue abnormality.   ASSESSMENT & PLAN:    Osteomyelitis ([place]) Discussed with patient clinical and radiographic findings of OM involving the left 3rd toe.  I have recommended definitive source control with amputation for this issue, however, Kelly Key declines and wants to attempt to salvage his toe.  Discussed further that cornerstone of treatment for OM is debridement and/or amputation of osteomyelitic bone and antibiotics alone to deal with this type of infection is challenging.  Also stressed importance of adequate peripheral blood flow, glycemic control, wound care, and off loading in addition to antibiotics.  We talked further about IV vs PO antibiotics and emerging data to suggest equivalency.  Kelly Key is fairly fixed in belief that Kelly Key needs IV.    Patient will continue to follow up with  podiatry and will start patient on cefepime 2gm IV and vancomcyin 2557m loading dose then 10068mper pharmacy recommendations dosed with HD for 6 weeks.  We will notify his HD center of antibiotics.  Will check baseline ESR, CRP.  Check A1c.  Follow up in 6 weeks.    Orders Placed This Encounter  Procedures   Sedimentation rate   C-reactive protein   HgB A1c        AnRaynelle Highlandor Infectious Disease [facility] 11/13/2021, 2:08 PM   I spent 60 minutes dedicated to the care of this patient on the date of this encounter to include pre-visit review of records,  face-to-face time with the patient discussing OM, ESRD, DM, and post-visit ordering of testing.

## 2021-11-14 LAB — HEMOGLOBIN A1C
Hgb A1c MFr Bld: 8 % of total Hgb — ABNORMAL HIGH (ref <5.7)
Mean Plasma Glucose: 183 mg/dL
eAG (mmol/L): 10.1 mmol/L

## 2021-11-14 LAB — C-REACTIVE PROTEIN: CRP: 6.9 mg/L (ref <8.0)

## 2021-11-14 LAB — SEDIMENTATION RATE: Sed Rate: 68 mm/h — ABNORMAL HIGH (ref 0–20)

## 2021-11-15 NOTE — Telephone Encounter (Signed)
[place] to confirm they started patient on antibiotics. Spoke with RN who confirmed patient started antbx post HD session yesterday. Leatrice Jewels, RMA

## 2021-12-25 ENCOUNTER — Other Ambulatory Visit: Payer: Self-pay

## 2021-12-25 ENCOUNTER — Telehealth: Payer: Self-pay

## 2021-12-25 ENCOUNTER — Ambulatory Visit (INDEPENDENT_AMBULATORY_CARE_PROVIDER_SITE_OTHER): Payer: Medicare Other | Admitting: Internal Medicine

## 2021-12-25 ENCOUNTER — Encounter: Payer: Self-pay | Admitting: Internal Medicine

## 2021-12-25 DIAGNOSIS — M86672 Other chronic osteomyelitis, left ankle and foot: Secondary | ICD-10-CM | POA: Diagnosis not present

## 2021-12-25 NOTE — Assessment & Plan Note (Signed)
Patient here for follow up of OM of left 3rd toe.  He will conclude 6 weeks of IV vancomycin and cefepime tomorrow with HD.  Appears to have had a good clinical response to antibiotics alone and I am hopeful we have eradicated this infection despite no surgical intervention or debridement.  Advised to follow up closely with his podiatrist for ongoing foot care and wound care.  Available as needed should any issues or concerns arise although utility of more antibiotics beyond 6 weeks to treat this infection likely would be of limited benefit.  ?

## 2021-12-25 NOTE — Progress Notes (Signed)
[place] for Infectious Disease  CHIEF COMPLAINT:    Follow up for left foot osteomyelitis  SUBJECTIVE:    Kelly Key is a 51 y.o. male with PMHx as below who presents to the clinic for left foot osteomyelitis.   Patient is here today for routine follow up regarding a left foot ulcer complicated by osteomyelitis confirmed on MRI 11/06/21.  He was started on cefepime and vancomycin x 6 weeks after initial visit with myself on 11/13/21.  Baseline ESR 68 and CRP 6.9.  Hgb A1c is 8.0.  He reports his wound is improved and no pain.  He reports not having any drainage from previous ulcer.  He is tolerating antibiotics and denies any fevers or chills.   Please see A&P for the details of today's visit and status of the patient's medical problems.   Patient's Medications  New Prescriptions   No medications on file  Previous Medications   ACETAMINOPHEN (TYLENOL) 500 MG TABLET    Take 1,000 mg by mouth every 6 (six) hours as needed for moderate pain or headache.   ALBUTEROL (VENTOLIN HFA) 108 (90 BASE) MCG/ACT INHALER    Inhale 2 puffs into the lungs every 4 (four) hours as needed for wheezing or shortness of breath.    ALLOPURINOL (ZYLOPRIM) 100 MG TABLET    Take 2 tablets (200 mg total) by mouth daily.   AMLODIPINE (NORVASC) 10 MG TABLET    Take 0.5 tablets (5 mg total) by mouth daily.   ASPIRIN EC 81 MG EC TABLET    Take 1 tablet (81 mg total) by mouth daily.   ATORVASTATIN (LIPITOR) 80 MG TABLET    Take 1 tablet (80 mg total) by mouth daily.   CALCIUM CARBONATE ANTACID (CALCIUM CARBONATE, DOSED IN MG ELEMENTAL CALCIUM,) 1250 MG/5ML SUSP    Take 5 mLs (500 mg of elemental calcium total) by mouth every 6 (six) hours as needed for indigestion.   CARVEDILOL (COREG) 25 MG TABLET    Take 1 tablet (25 mg total) by mouth 2 (two) times daily with a meal.   CLONIDINE (CATAPRES) 0.3 MG TABLET    Take 0.3 mg by mouth 3 (three) times daily.   DICLOFENAC SODIUM (VOLTAREN) 1 % GEL    Apply 2  g topically 4 (four) times daily.   EZETIMIBE (ZETIA) 10 MG TABLET    Take 10 mg by mouth every evening.   FLUTICASONE (FLONASE) 50 MCG/ACT NASAL SPRAY    Place 1 spray into both nostrils daily as needed for allergies or rhinitis.   FUROSEMIDE (LASIX) 80 MG TABLET    Take 1 tablet (80 mg total) by mouth daily.   GABAPENTIN (NEURONTIN) 100 MG CAPSULE    Take 1 capsule (100 mg total) by mouth 3 (three) times daily.   GLUCOSE BLOOD (ACCU-CHEK AVIVA PLUS) TEST STRIP    Use as instructed TO CHECK BLOOD GLUCOSE THREE TIMES DAILY   HYDROCODONE-ACETAMINOPHEN (NORCO/VICODIN) 5-325 MG TABLET    Take 1 tablet by mouth every 6 (six) hours as needed for moderate pain.   HYDROXYZINE (ATARAX) 25 MG TABLET    Take 25 mg by mouth 3 (three) times daily as needed.   INSULIN PEN NEEDLE (B-D ULTRAFINE III SHORT PEN) 31G X 8 MM MISC    1 each by Does not apply route as directed.   LEVEMIR FLEXTOUCH 100 UNIT/ML FLEXTOUCH PEN    INJECT 50 UNITS SUBCUTANEOUSLY AT BEDTIME   LINACLOTIDE (LINZESS) 145  MCG CAPS CAPSULE    Take 1 capsule (145 mcg total) by mouth daily before breakfast.   MULTIVITAMIN (RENA-VIT) TABS TABLET    Take 1 tablet by mouth at bedtime.   NA SULFATE-K SULFATE-MG SULF (SUPREP BOWEL PREP KIT) 17.5-3.13-1.6 GM/177ML SOLN    Take 1 kit by mouth as directed.   NOVOLOG FLEXPEN 100 UNIT/ML FLEXPEN    INJECT 8 TO 14 UNITS SUBCUTANEOUSLY THREE TIMES DAILY WITH MEALS   OXYCODONE-ACETAMINOPHEN (PERCOCET) 5-325 MG TABLET    Take 1-2 tablets by mouth every 6 (six) hours as needed.   OZEMPIC, 0.25 OR 0.5 MG/DOSE, 2 MG/1.5ML SOPN    Inject 0.25 mg into the skin every Friday.   SEVELAMER CARBONATE (RENVELA) 800 MG TABLET    Take 3 tablets (2,400 mg total) by mouth 3 (three) times daily with meals.   SSD 1 % CREAM    Apply topically 2 (two) times daily.   VALACYCLOVIR (VALTREX) 1000 MG TABLET    Take 1 tablet (1,000 mg total) by mouth 3 (three) times daily.   VARDENAFIL (LEVITRA) 20 MG TABLET    Take 1 tablet (20 mg  total) by mouth daily as needed for erectile dysfunction.  Modified Medications   No medications on file  Discontinued Medications   AMOXICILLIN (AMOXIL) 500 MG CAPSULE    Take 500 mg by mouth 2 (two) times daily.   CLINDAMYCIN (CLEOCIN) 300 MG CAPSULE    Take 1 capsule (300 mg total) by mouth 4 (four) times daily. X 7 days   VANCOMYCIN [facility]) 1 G INJECTION    SMARTSIG:IV Once      Past Medical History:  Diagnosis Date   Acute diastolic CHF (congestive heart failure) ([place]) 06/11/2012   EF 50-55% [facility])   Chronic kidney disease    COPD (chronic obstructive pulmonary disease) (HCC)    Diabetes mellitus    A1c 11.5 (06/11/2012).   Dyspnea    Gout    Hepatic steatosis 06/11/2012   Elevated LFTs   Hyperlipemia    Malignant hypertension    Microcytic anemia 06/12/2012   Obesity     Social History   Tobacco Use   Smoking status: Never    Passive exposure: Past   Smokeless tobacco: Never  Vaping Use   Vaping Use: Never used  Substance Use Topics   Alcohol use: Not Currently    Comment: haven't drank in over 5 years    Drug use: No    Family History  Problem Relation Age of Onset   Gout Mother    Asthma Mother    Diabetes Father    Heart failure Father    Diabetes Sister    Hypertension Brother    Pancreatic cancer Brother    Diabetes Sister     Allergies  Allergen Reactions   Hydralazine Anxiety    Hallucinations    Lisinopril Swelling    Review of Systems  All other systems reviewed and are negative. Except as noted above.   OBJECTIVE:    Vitals:   12/25/21 1151  BP: 121/81  Pulse: 77  Temp: 97.6 F (36.4 C)  TempSrc: Oral  SpO2: 97%  Weight: (!) 310 lb (140.6 kg)  Height: 6' 1"  (1.854 m)   Body mass index is 40.9 kg/m.  Physical Exam Constitutional:      General: He is not in acute distress.    Appearance: Normal appearance. He is obese.  HENT:     Head: Normocephalic and atraumatic.  Eyes:  Extraocular Movements:  Extraocular movements intact.     Conjunctiva/sclera: Conjunctivae normal.  Pulmonary:     Effort: Pulmonary effort is normal. No respiratory distress.     Breath sounds: Normal breath sounds.  Abdominal:     General: There is no distension.     Palpations: Abdomen is soft.  Musculoskeletal:        General: Normal range of motion.     Cervical back: Normal range of motion and neck supple.     Right lower leg: No edema.     Left lower leg: No edema.     Comments: Left 3rd toe with no drainage, warmth, erythema.  Some swelling noted but no TTP.   Skin:    General: Skin is warm and dry.  Neurological:     General: No focal deficit present.     Mental Status: He is alert and oriented to person, place, and time.  Psychiatric:        Mood and Affect: Mood normal.        Behavior: Behavior normal.      Labs and Microbiology: CBC Latest Ref Rng & Units 06/10/2021 05/18/2021 05/18/2021  WBC 3.4 - 10.8 x10E3/uL 7.3 5.9 TEST CANCELLED PER RN  Hemoglobin 13.0 - 17.7 g/dL 10.5(L) 9.8(L) TEST CANCELLED PER RN  Hematocrit 37.5 - 51.0 % 31.5(L) 30.9(L) TEST CANCELLED PER RN  Platelets 150 - 450 x10E3/uL 310 265 TEST CANCELLED PER RN   CMP Latest Ref Rng & Units 06/10/2021 05/18/2021 05/18/2021  Glucose 65 - 99 mg/dL 97 125(H) TEST CANCELLED PER RN  BUN 6 - 24 mg/dL 66(H) 65(H) TEST CANCELLED PER RN  Creatinine 0.76 - 1.27 mg/dL 9.41(H) 8.66(H) TEST CANCELLED PER RN  Sodium 134 - 144 mmol/L 137 134(L) TEST CANCELLED PER RN  Potassium 3.5 - 5.2 mmol/L 4.9 4.1 TEST CANCELLED PER RN  Chloride 96 - 106 mmol/L 95(L) 96(L) TEST CANCELLED PER RN  CO2 20 - 29 mmol/L 24 25 TEST CANCELLED PER RN  Calcium 8.7 - 10.2 mg/dL 9.0 8.4(L) TEST CANCELLED PER RN  Total Protein 6.5 - 8.1 g/dL - 6.3(L) TEST CANCELLED PER RN  Total Bilirubin 0.3 - 1.2 mg/dL - 0.6 TEST CANCELLED PER RN  Alkaline Phos 38 - 126 U/L - 45 TEST CANCELLED PER RN  AST 15 - 41 U/L - 13(L) TEST CANCELLED PER RN  ALT 0 - 44 U/L - 16 TEST  CANCELLED PER RN       ASSESSMENT & PLAN:    Osteomyelitis ([place]) Patient here for follow up of OM of left 3rd toe.  He will conclude 6 weeks of IV vancomycin and cefepime tomorrow with HD.  Appears to have had a good clinical response to antibiotics alone and I am hopeful we have eradicated this infection despite no surgical intervention or debridement.  Advised to follow up closely with his podiatrist for ongoing foot care and wound care.  Available as needed should any issues or concerns arise although utility of more antibiotics beyond 6 weeks to treat this infection likely would be of limited benefit.       Raynelle Highland for Infectious Disease [facility] 12/25/2021, 12:17 PM  I spent 30 minutes dedicated to the care of this patient on the date of this encounter to include pre-visit review of records, face-to-face time with the patient discussing OM, and post-visit ordering of testing.

## 2021-12-25 NOTE — Telephone Encounter (Signed)
Spoke with Aniceto Boss, nurse at [facility], relayed to discontinue IV vancomycin and cefepime after his last dose tomorrow 12/26/21 per Dr. Juleen China. Orders repeated and verified.  ? ?Beryle Flock, RN ? ?

## 2021-12-25 NOTE — Telephone Encounter (Signed)
[place] to relay verbal order from Dr. Juleen China to discontinue IV vancomycin and cefepime after completion of course tomorrow (12/26/2021). ? ?No answer. Left HIPAA-compliant voicemail requesting a call back. ? ?P: [phone] ? ?Binnie Kand, RN  ?

## 2021-12-25 NOTE — Patient Instructions (Signed)
Thank you for coming to see me today. It was a pleasure seeing you. ? ?To Do: ?Continue to follow up with Dr Posey Pronto and let us know if any concerns after stopping antibiotics. ? ?If you have any questions or concerns, please do not hesitate to call the office at [phone]. ? ?Take Care,  ? ?Jule Ser ? ?

## 2022-01-01 ENCOUNTER — Other Ambulatory Visit: Payer: Self-pay

## 2022-01-01 ENCOUNTER — Encounter: Payer: Self-pay | Admitting: Urology

## 2022-01-01 ENCOUNTER — Ambulatory Visit (INDEPENDENT_AMBULATORY_CARE_PROVIDER_SITE_OTHER): Payer: Medicare Other | Admitting: Urology

## 2022-01-01 VITALS — BP 142/82 | HR 101

## 2022-01-01 DIAGNOSIS — N529 Male erectile dysfunction, unspecified: Secondary | ICD-10-CM

## 2022-01-01 MED ORDER — AMBULATORY NON FORMULARY MEDICATION: 0 refills | Status: DC

## 2022-01-01 NOTE — Patient Instructions (Signed)
Pick up prescription at [facility] [place] ?

## 2022-01-01 NOTE — Progress Notes (Signed)
? ?Assessment: ?1. Organic impotence   ? ?Plan: ?I reviewed additional management options for erectile dysfunction including vacuum erection device, penile injections, intraurethral suppository therapy (MUSE), and penile prosthesis. ?He would like to proceed with penile injections. ?Prescription for Trimix 10/30/1 sent to compounding pharmacy. ?Schedule for morning appointment for test dose of Trimix in the office. ?Information on injection therapy provided to the patient. ? ?Chief Complaint:  ?Chief Complaint  ?Patient presents with  ? Erectile Dysfunction  ? ?History of Present Illness: ? ?Kelly Key is a 51 y.o. year old male who is seen for further evaluation of erectile dysfunction.  At his visit in 10/22, he reported symptoms had been present for approximately 1 year with gradual worsening.  He was able to achieve a partial erection with 50% rigidity but had rapid detumescence.  He was unable to use his erections for intercourse.  No nocturnal or early morning erections.  No decrease in his libido.  No pain or curvature with erections.  He had previously tried Viagra 100 mg without benefit.  He had also tried some over-the-counter medications without benefit. ?He is on hemodialysis for end-stage renal disease and voids approximately twice a day.  No significant voiding symptoms. ?He was given a trial of Levitra 20 mg as needed at his visit in 10/22. ? ?Today for follow-up.  He did not see any improvement in his erectile dysfunction with the Levitra.  He continues to have difficulty achieving and maintaining his erections.  No decrease in his libido.  He is interested in other treatment options. ? ?Portions of the above documentation were copied from a prior visit for review purposes only. ? ?Past Medical History:  ?Past Medical History:  ?Diagnosis Date  ? Acute diastolic CHF (congestive heart failure) ([place]) 06/11/2012  ? EF 50-55% [facility])  ? Chronic kidney disease   ? COPD (chronic obstructive  pulmonary disease) ([place])   ? Diabetes mellitus   ? A1c 11.5 (06/11/2012).  ? Dyspnea   ? Gout   ? Hepatic steatosis 06/11/2012  ? Elevated LFTs  ? Hyperlipemia   ? Malignant hypertension   ? Microcytic anemia 06/12/2012  ? Obesity   ? ? ?Past Surgical History:  ?Past Surgical History:  ?Procedure Laterality Date  ? AV FISTULA PLACEMENT Left 11/11/2019  ? Procedure: ARTERIOVENOUS (AV) FISTULA CREATION LEFT BRACHIOCEPHALIC ARM;  Surgeon: Serafina Mitchell, MD;  [facility];  Service: Vascular;  Laterality: Left;  ? COLONOSCOPY WITH PROPOFOL N/A 09/02/2021  ? Procedure: COLONOSCOPY WITH PROPOFOL;  Surgeon: Jonathon Bellows, MD;  Location: [facility] ENDOSCOPY;  Service: Gastroenterology;  Laterality: N/A;  ? IR FLUORO GUIDE CV LINE RIGHT  07/08/2019  ? IR REMOVAL TUN CV CATH W/O FL  03/23/2020  ? IR US GUIDE VASC ACCESS RIGHT  07/08/2019  ? LEFT HEART CATH AND CORONARY ANGIOGRAPHY N/A 06/12/2021  ? Procedure: LEFT HEART CATH AND CORONARY ANGIOGRAPHY;  Surgeon: Belva Crome, MD;  Location: [place] CV LAB;  Service: Cardiovascular;  Laterality: N/A;  ? NO PAST SURGERIES    ? VASCULAR SURGERY    ? ? ?Allergies:  ?Allergies  ?Allergen Reactions  ? Hydralazine Anxiety  ?  Hallucinations   ? Lisinopril Swelling  ? ? ?Family History:  ?Family History  ?Problem Relation Age of Onset  ? Gout Mother   ? Asthma Mother   ? Diabetes Father   ? Heart failure Father   ? Diabetes Sister   ? Hypertension Brother   ? Pancreatic cancer  Brother   ? Diabetes Sister   ? ? ?Social History:  ?Social History  ? ?Tobacco Use  ? Smoking status: Never  ?  Passive exposure: Past  ? Smokeless tobacco: Never  ?Vaping Use  ? Vaping Use: Never used  ?Substance Use Topics  ? Alcohol use: Not Currently  ?  Comment: haven't drank in over 5 years   ? Drug use: No  ? ? ?ROS: ?Constitutional:  Negative for fever, chills, weight loss ?CV: Negative for chest pain, previous MI, hypertension ?Respiratory:  Negative for shortness of breath, wheezing, sleep apnea, frequent  cough ?GI:  Negative for nausea, vomiting, bloody stool, GERD ? ?Physical exam: ?BP (!) 142/82   Pulse (!) 101  ?GENERAL APPEARANCE:  Well appearing, well developed, well nourished, NAD ?HEENT:  Atraumatic, normocephalic, oropharynx clear ?NECK:  Supple without lymphadenopathy or thyromegaly ?ABDOMEN:  Soft, non-tender, no masses ?EXTREMITIES:  Moves all extremities well, without clubbing, cyanosis, or edema ?NEUROLOGIC:  Alert and oriented x 3, normal gait, CN II-XII grossly intact ?MENTAL STATUS:  appropriate ?BACK:  Non-tender to palpation, No CVAT ?SKIN:  Warm, dry, and intact ? ? ?Results: ?No specimen obtained ? ?

## 2022-01-10 ENCOUNTER — Ambulatory Visit: Payer: Medicare Other | Admitting: Urology

## 2022-01-10 ENCOUNTER — Other Ambulatory Visit: Payer: Self-pay

## 2022-01-10 NOTE — Progress Notes (Deleted)
? ?Assessment: ?1. Organic impotence   ? ? ?Plan: ?Injection procedure reviewed with the patient in detail. ?Potential side effects of prolonged erection and need for urgent management should this happen discussed. ?Trial of Trimix 10/30/1 0.2 ml per injection  ?Patient will call with results and to discuss dosage adjustments as needed. ?Return to office in 2 months ? ?Chief Complaint:  ?No chief complaint on file. ? ?History of Present Illness: ? ?Kelly Key is a 51 y.o. year old male who is seen for further evaluation of erectile dysfunction.  At his visit in 10/22, he reported symptoms had been present for approximately 1 year with gradual worsening.  He was able to achieve a partial erection with 50% rigidity but had rapid detumescence.  He was unable to use his erections for intercourse.  No nocturnal or early morning erections.  No decrease in his libido.  No pain or curvature with erections.  He had previously tried Viagra 100 mg without benefit.  He had also tried some over-the-counter medications without benefit. ?He is on hemodialysis for end-stage renal disease and voids approximately twice a day.  No significant voiding symptoms. ?He was given a trial of Levitra 20 mg as needed at his visit in 10/22. ? ?He did not see any improvement in his erectile dysfunction with the Levitra.  He continued to have difficulty achieving and maintaining his erections.  No decrease in his libido.   ?He presents today for a trial of intracavernosal injection therapy with TriMix. ? ?Portions of the above documentation were copied from a prior visit for review purposes only. ? ?Past Medical History:  ?Past Medical History:  ?Diagnosis Date  ? Acute diastolic CHF (congestive heart failure) ([place]) 06/11/2012  ? EF 50-55% [facility])  ? Chronic kidney disease   ? COPD (chronic obstructive pulmonary disease) ([place])   ? Diabetes mellitus   ? A1c 11.5 (06/11/2012).  ? Dyspnea   ? Gout   ? Hepatic steatosis 06/11/2012  ?  Elevated LFTs  ? Hyperlipemia   ? Malignant hypertension   ? Microcytic anemia 06/12/2012  ? Obesity   ? ? ?Past Surgical History:  ?Past Surgical History:  ?Procedure Laterality Date  ? AV FISTULA PLACEMENT Left 11/11/2019  ? Procedure: ARTERIOVENOUS (AV) FISTULA CREATION LEFT BRACHIOCEPHALIC ARM;  Surgeon: Serafina Mitchell, MD;  [facility];  Service: Vascular;  Laterality: Left;  ? COLONOSCOPY WITH PROPOFOL N/A 09/02/2021  ? Procedure: COLONOSCOPY WITH PROPOFOL;  Surgeon: Jonathon Bellows, MD;  Location: [facility] ENDOSCOPY;  Service: Gastroenterology;  Laterality: N/A;  ? IR FLUORO GUIDE CV LINE RIGHT  07/08/2019  ? IR REMOVAL TUN CV CATH W/O FL  03/23/2020  ? IR US GUIDE VASC ACCESS RIGHT  07/08/2019  ? LEFT HEART CATH AND CORONARY ANGIOGRAPHY N/A 06/12/2021  ? Procedure: LEFT HEART CATH AND CORONARY ANGIOGRAPHY;  Surgeon: Belva Crome, MD;  Location: [place] CV LAB;  Service: Cardiovascular;  Laterality: N/A;  ? NO PAST SURGERIES    ? VASCULAR SURGERY    ? ? ?Allergies:  ?Allergies  ?Allergen Reactions  ? Hydralazine Anxiety  ?  Hallucinations   ? Lisinopril Swelling  ? ? ?Family History:  ?Family History  ?Problem Relation Age of Onset  ? Gout Mother   ? Asthma Mother   ? Diabetes Father   ? Heart failure Father   ? Diabetes Sister   ? Hypertension Brother   ? Pancreatic cancer Brother   ? Diabetes Sister   ? ? ?  Social History:  ?Social History  ? ?Tobacco Use  ? Smoking status: Never  ?  Passive exposure: Past  ? Smokeless tobacco: Never  ?Vaping Use  ? Vaping Use: Never used  ?Substance Use Topics  ? Alcohol use: Not Currently  ?  Comment: haven't drank in over 5 years   ? Drug use: No  ? ? ?ROS: ?Constitutional:  Negative for fever, chills, weight loss ?CV: Negative for chest pain, previous MI, hypertension ?Respiratory:  Negative for shortness of breath, wheezing, sleep apnea, frequent cough ?GI:  Negative for nausea, vomiting, bloody stool, GERD ? ?Physical exam: ?There were no vitals taken for this  visit. ?*** ? ?Results: ?No specimen obtained ? ?Procedure:  Penile Injection ? ?Under sterile conditions *** milliliters of {BJSPENILEINJECTIONDRUGS:26345} is injected into the penile corporeal body.  The patient is then examined after 10 minutes.  The injection  induces a {BJSPENILEINJRESULTS:26346} erection for vaginal penetration. ? ?The patient is instructed on proper injection technique under sterile conditions.  Most importantly he realizes if he ever has a painful prolonged erection also known as a priapism that he should seek care within three hours.  Failure to seek care could lead to permanent penile damage, penile fibrosis, penile gangrene, or possible loss of penis.  He reports good good understanding.  All questions are answered to his satisfaction. ? ?

## 2022-01-15 ENCOUNTER — Other Ambulatory Visit: Payer: Self-pay

## 2022-01-15 ENCOUNTER — Ambulatory Visit: Payer: Medicare Other | Admitting: Urology

## 2022-01-15 ENCOUNTER — Encounter: Payer: Self-pay | Admitting: Urology

## 2022-01-15 VITALS — BP 141/87 | HR 88

## 2022-01-15 DIAGNOSIS — N529 Male erectile dysfunction, unspecified: Secondary | ICD-10-CM | POA: Diagnosis not present

## 2022-01-15 NOTE — Progress Notes (Signed)
? ?Assessment: ?1. Organic impotence   ? ? ?Plan: ?Injection procedure reviewed with the patient in detail. ?Potential side effects of prolonged erection and need for urgent management should this happen discussed. ?Begin Trimix 10/30/1 0.2 ml per injection  ?Patient will call with results and to discuss dosage adjustments as needed. ?Return to office in 6-8 weeks ? ?Chief Complaint:  ?Chief Complaint  ?Patient presents with  ? Erectile Dysfunction  ? ?History of Present Illness: ? ?Kelly Key is a 51 y.o. year old male who is seen for further evaluation of erectile dysfunction.  At his visit in 10/22, he reported symptoms had been present for approximately 1 year with gradual worsening.  He was able to achieve a partial erection with 50% rigidity but had rapid detumescence.  He was unable to use his erections for intercourse.  No nocturnal or early morning erections.  No decrease in his libido.  No pain or curvature with erections.  He had previously tried Viagra 100 mg without benefit.  He had also tried some over-the-counter medications without benefit. ?He is on hemodialysis for end-stage renal disease and voids approximately twice a day.  No significant voiding symptoms. ?He was given a trial of Levitra 20 mg as needed at his visit in 10/22. ? ?He did not see any improvement in his erectile dysfunction with the Levitra.  He continued to have difficulty achieving and maintaining his erections.  No decrease in his libido.   ?He presents today for a trial of intracavernosal injection therapy with TriMix. ? ?Portions of the above documentation were copied from a prior visit for review purposes only. ? ?Past Medical History:  ?Past Medical History:  ?Diagnosis Date  ? Acute diastolic CHF (congestive heart failure) ([place]) 06/11/2012  ? EF 50-55% [facility])  ? Chronic kidney disease   ? COPD (chronic obstructive pulmonary disease) ([place])   ? Diabetes mellitus   ? A1c 11.5 (06/11/2012).  ? Dyspnea   ? Gout    ? Hepatic steatosis 06/11/2012  ? Elevated LFTs  ? Hyperlipemia   ? Malignant hypertension   ? Microcytic anemia 06/12/2012  ? Obesity   ? ? ?Past Surgical History:  ?Past Surgical History:  ?Procedure Laterality Date  ? AV FISTULA PLACEMENT Left 11/11/2019  ? Procedure: ARTERIOVENOUS (AV) FISTULA CREATION LEFT BRACHIOCEPHALIC ARM;  Surgeon: Serafina Mitchell, MD;  [facility] OR;  Service: Vascular;  Laterality: Left;  ? COLONOSCOPY WITH PROPOFOL N/A 09/02/2021  ? Procedure: COLONOSCOPY WITH PROPOFOL;  Surgeon: Jonathon Bellows, MD;  Location: [facility] ENDOSCOPY;  Service: Gastroenterology;  Laterality: N/A;  ? IR FLUORO GUIDE CV LINE RIGHT  07/08/2019  ? IR REMOVAL TUN CV CATH W/O FL  03/23/2020  ? IR US GUIDE VASC ACCESS RIGHT  07/08/2019  ? LEFT HEART CATH AND CORONARY ANGIOGRAPHY N/A 06/12/2021  ? Procedure: LEFT HEART CATH AND CORONARY ANGIOGRAPHY;  Surgeon: Belva Crome, MD;  Location: [place] CV LAB;  Service: Cardiovascular;  Laterality: N/A;  ? NO PAST SURGERIES    ? VASCULAR SURGERY    ? ? ?Allergies:  ?Allergies  ?Allergen Reactions  ? Hydralazine Anxiety  ?  Hallucinations   ? Lisinopril Swelling  ? ? ?Family History:  ?Family History  ?Problem Relation Age of Onset  ? Gout Mother   ? Asthma Mother   ? Diabetes Father   ? Heart failure Father   ? Diabetes Sister   ? Hypertension Brother   ? Pancreatic cancer Brother   ? Diabetes  Sister   ? ? ?Social History:  ?Social History  ? ?Tobacco Use  ? Smoking status: Never  ?  Passive exposure: Past  ? Smokeless tobacco: Never  ?Vaping Use  ? Vaping Use: Never used  ?Substance Use Topics  ? Alcohol use: Not Currently  ?  Comment: haven't drank in over 5 years   ? Drug use: No  ? ? ?ROS: ?Constitutional:  Negative for fever, chills, weight loss ?CV: Negative for chest pain, previous MI, hypertension ?Respiratory:  Negative for shortness of breath, wheezing, sleep apnea, frequent cough ?GI:  Negative for nausea, vomiting, bloody stool, GERD ? ?Physical exam: ?BP (!) 141/87    Pulse 88  ?GENERAL APPEARANCE:  Well appearing, well developed, well nourished, NAD ?HEENT:  Atraumatic, normocephalic, oropharynx clear ?NECK:  Supple without lymphadenopathy or thyromegaly ?ABDOMEN:  Soft, non-tender, no masses ?EXTREMITIES:  Moves all extremities well, without clubbing, cyanosis, or edema ?NEUROLOGIC:  Alert and oriented x 3, normal gait, CN II-XII grossly intact ?MENTAL STATUS:  appropriate ?BACK:  Non-tender to palpation, No CVAT ?SKIN:  Warm, dry, and intact ? ? ?Results: ?No specimen obtained ? ?Procedure:  Penile Injection ? ?Under sterile conditions 0.2 milliliters of triple mix 10/30/1  is injected into the penile corporeal body.  The patient is then examined after 10 minutes.  The injection  induces a satisfactory erection for vaginal penetration. ? ?The patient is instructed on proper injection technique under sterile conditions.  Most importantly he realizes if he ever has a painful prolonged erection also known as a priapism that he should seek care within three hours.  Failure to seek care could lead to permanent penile damage, penile fibrosis, penile gangrene, or possible loss of penis.  He reports good good understanding.  All questions are answered to his satisfaction. ? ?

## 2022-03-05 ENCOUNTER — Ambulatory Visit: Payer: Medicare Other | Admitting: Urology

## 2022-03-05 NOTE — Progress Notes (Deleted)
Assessment: 1. Organic impotence     Plan: Injection procedure reviewed with the patient in detail. Potential side effects of prolonged erection and need for urgent management should this happen discussed. Begin Trimix 10/30/1 0.2 ml per injection  Patient will call with results and to discuss dosage adjustments as needed. Return to office in 6-8 weeks  Chief Complaint:  No chief complaint on file.  History of Present Illness:  Kelly Key is a 51 y.o. year old male who is seen for further evaluation of erectile dysfunction.  At his visit in 10/22, he reported symptoms had been present for approximately 1 year with gradual worsening.  He was able to achieve a partial erection with 50% rigidity but had rapid detumescence.  He was unable to use his erections for intercourse.  No nocturnal or early morning erections.  No decrease in his libido.  No pain or curvature with erections.  He had previously tried Viagra 100 mg without benefit.  He had also tried some over-the-counter medications without benefit. He is on hemodialysis for end-stage renal disease and voids approximately twice a day.  No significant voiding symptoms. He was given a trial of Levitra 20 mg as needed at his visit in 10/22.  He did not see any improvement in his erectile dysfunction with the Levitra.  He continued to have difficulty achieving and maintaining his erections.  No decrease in his libido.   He was started on penile injections with Trimix 10/30/1 0.2 mL/inj. at his visit in March 2023.   Portions of the above documentation were copied from a prior visit for review purposes only.  Past Medical History:  Past Medical History:  Diagnosis Date   Acute diastolic CHF (congestive heart failure) ([place]) 06/11/2012   EF 50-55% [facility])   Chronic kidney disease    COPD (chronic obstructive pulmonary disease) ([facility])    Diabetes mellitus    A1c 11.5 (06/11/2012).   Dyspnea    Gout    Hepatic steatosis  06/11/2012   Elevated LFTs   Hyperlipemia    Malignant hypertension    Microcytic anemia 06/12/2012   Obesity     Past Surgical History:  Past Surgical History:  Procedure Laterality Date   AV FISTULA PLACEMENT Left 11/11/2019   Procedure: ARTERIOVENOUS (AV) FISTULA CREATION LEFT BRACHIOCEPHALIC ARM;  Surgeon: Serafina Mitchell, MD;  Location: [place];  Service: Vascular;  Laterality: Left;   COLONOSCOPY WITH PROPOFOL N/A 09/02/2021   Procedure: COLONOSCOPY WITH PROPOFOL;  Surgeon: Jonathon Bellows, MD;  Location: [facility] ENDOSCOPY;  Service: Gastroenterology;  Laterality: N/A;   IR FLUORO GUIDE CV LINE RIGHT  07/08/2019   IR REMOVAL TUN CV CATH W/O FL  03/23/2020   IR US GUIDE VASC ACCESS RIGHT  07/08/2019   LEFT HEART CATH AND CORONARY ANGIOGRAPHY N/A 06/12/2021   Procedure: LEFT HEART CATH AND CORONARY ANGIOGRAPHY;  Surgeon: Belva Crome, MD;  Location: [place] CV LAB;  Service: Cardiovascular;  Laterality: N/A;   NO PAST SURGERIES     VASCULAR SURGERY      Allergies:  Allergies  Allergen Reactions   Hydralazine Anxiety    Hallucinations    Lisinopril Swelling    Family History:  Family History  Problem Relation Age of Onset   Gout Mother    Asthma Mother    Diabetes Father    Heart failure Father    Diabetes Sister    Hypertension Brother    Pancreatic cancer Brother    Diabetes  Sister     Social History:  Social History   Tobacco Use   Smoking status: Never    Passive exposure: Past   Smokeless tobacco: Never  Vaping Use   Vaping Use: Never used  Substance Use Topics   Alcohol use: Not Currently    Comment: haven't drank in over 5 years    Drug use: No    ROS: Constitutional:  Negative for fever, chills, weight loss CV: Negative for chest pain, previous MI, hypertension Respiratory:  Negative for shortness of breath, wheezing, sleep apnea, frequent cough GI:  Negative for nausea, vomiting, bloody stool, GERD  Physical exam: There were no vitals taken for  this visit. GENERAL APPEARANCE:  Well appearing, well developed, well nourished, NAD HEENT:  Atraumatic, normocephalic, oropharynx clear NECK:  Supple without lymphadenopathy or thyromegaly ABDOMEN:  Soft, non-tender, no masses EXTREMITIES:  Moves all extremities well, without clubbing, cyanosis, or edema NEUROLOGIC:  Alert and oriented x 3, normal gait, CN II-XII grossly intact MENTAL STATUS:  appropriate BACK:  Non-tender to palpation, No CVAT SKIN:  Warm, dry, and intact   Results: No specimen obtained

## 2022-03-17 ENCOUNTER — Ambulatory Visit: Payer: Medicare Other | Admitting: Urology

## 2022-03-17 NOTE — Progress Notes (Deleted)
Assessment: 1. Organic impotence     Plan: Injection procedure reviewed with the patient in detail. Potential side effects of prolonged erection and need for urgent management should this happen discussed. Begin Trimix 10/30/1 0.2 ml per injection  Patient will call with results and to discuss dosage adjustments as needed. Return to office in 6-8 weeks  Chief Complaint:  No chief complaint on file.  History of Present Illness:  Kelly Key is a 51 y.o. year old male who is seen for further evaluation of erectile dysfunction.  At his visit in 10/22, he reported symptoms had been present for approximately 1 year with gradual worsening.  He was able to achieve a partial erection with 50% rigidity but had rapid detumescence.  He was unable to use his erections for intercourse.  No nocturnal or early morning erections.  No decrease in his libido.  No pain or curvature with erections.  He had previously tried Viagra 100 mg without benefit.  He had also tried some over-the-counter medications without benefit. He is on hemodialysis for end-stage renal disease and voids approximately twice a day.  No significant voiding symptoms. He was given a trial of Levitra 20 mg as needed at his visit in 10/22.  He did not see any improvement in his erectile dysfunction with the Levitra.  He continued to have difficulty achieving and maintaining his erections.  No decrease in his libido.   He was started on penile injections with Trimix 10/30/1 0.2 mL/inj. at his visit in March 2023.   Portions of the above documentation were copied from a prior visit for review purposes only.  Past Medical History:  Past Medical History:  Diagnosis Date   Acute diastolic CHF (congestive heart failure) ([place]) 06/11/2012   EF 50-55% [facility])   Chronic kidney disease    COPD (chronic obstructive pulmonary disease) ([facility])    Diabetes mellitus    A1c 11.5 (06/11/2012).   Dyspnea    Gout    Hepatic steatosis  06/11/2012   Elevated LFTs   Hyperlipemia    Malignant hypertension    Microcytic anemia 06/12/2012   Obesity     Past Surgical History:  Past Surgical History:  Procedure Laterality Date   AV FISTULA PLACEMENT Left 11/11/2019   Procedure: ARTERIOVENOUS (AV) FISTULA CREATION LEFT BRACHIOCEPHALIC ARM;  Surgeon: Serafina Mitchell, MD;  Location: [place];  Service: Vascular;  Laterality: Left;   COLONOSCOPY WITH PROPOFOL N/A 09/02/2021   Procedure: COLONOSCOPY WITH PROPOFOL;  Surgeon: Jonathon Bellows, MD;  Location: [facility] ENDOSCOPY;  Service: Gastroenterology;  Laterality: N/A;   IR FLUORO GUIDE CV LINE RIGHT  07/08/2019   IR REMOVAL TUN CV CATH W/O FL  03/23/2020   IR US GUIDE VASC ACCESS RIGHT  07/08/2019   LEFT HEART CATH AND CORONARY ANGIOGRAPHY N/A 06/12/2021   Procedure: LEFT HEART CATH AND CORONARY ANGIOGRAPHY;  Surgeon: Belva Crome, MD;  Location: [place] CV LAB;  Service: Cardiovascular;  Laterality: N/A;   NO PAST SURGERIES     VASCULAR SURGERY      Allergies:  Allergies  Allergen Reactions   Hydralazine Anxiety    Hallucinations    Lisinopril Swelling    Family History:  Family History  Problem Relation Age of Onset   Gout Mother    Asthma Mother    Diabetes Father    Heart failure Father    Diabetes Sister    Hypertension Brother    Pancreatic cancer Brother    Diabetes  Sister     Social History:  Social History   Tobacco Use   Smoking status: Never    Passive exposure: Past   Smokeless tobacco: Never  Vaping Use   Vaping Use: Never used  Substance Use Topics   Alcohol use: Not Currently    Comment: haven't drank in over 5 years    Drug use: No    ROS: Constitutional:  Negative for fever, chills, weight loss CV: Negative for chest pain, previous MI, hypertension Respiratory:  Negative for shortness of breath, wheezing, sleep apnea, frequent cough GI:  Negative for nausea, vomiting, bloody stool, GERD  Physical exam: There were no vitals taken for  this visit. GENERAL APPEARANCE:  Well appearing, well developed, well nourished, NAD HEENT:  Atraumatic, normocephalic, oropharynx clear NECK:  Supple without lymphadenopathy or thyromegaly ABDOMEN:  Soft, non-tender, no masses EXTREMITIES:  Moves all extremities well, without clubbing, cyanosis, or edema NEUROLOGIC:  Alert and oriented x 3, normal gait, CN II-XII grossly intact MENTAL STATUS:  appropriate BACK:  Non-tender to palpation, No CVAT SKIN:  Warm, dry, and intact   Results: No specimen obtained

## 2022-03-19 ENCOUNTER — Ambulatory Visit (INDEPENDENT_AMBULATORY_CARE_PROVIDER_SITE_OTHER): Payer: Medicare Other | Admitting: Urology

## 2022-03-19 ENCOUNTER — Encounter: Payer: Self-pay | Admitting: Urology

## 2022-03-19 VITALS — BP 130/74 | HR 91

## 2022-03-19 DIAGNOSIS — N529 Male erectile dysfunction, unspecified: Secondary | ICD-10-CM

## 2022-03-19 NOTE — Progress Notes (Signed)
Assessment: 1. Organic impotence     Plan: Continue Trimix 10/30/1 0.2 ml per injection  Return to office in 6 months.  Chief Complaint:  Chief Complaint  Patient presents with   Erectile Dysfunction   History of Present Illness:  Kelly Key is a 51 y.o. year old male who is seen for further evaluation of erectile dysfunction.  At his visit in 10/22, he reported symptoms had been present for approximately 1 year with gradual worsening.  He was able to achieve a partial erection with 50% rigidity but had rapid detumescence.  He was unable to use his erections for intercourse.  No nocturnal or early morning erections.  No decrease in his libido.  No pain or curvature with erections.  He had previously tried Viagra 100 mg without benefit.  He had also tried some over-the-counter medications without benefit. He is on hemodialysis for end-stage renal disease and voids approximately twice a day.  No significant voiding symptoms. He was given a trial of Levitra 20 mg as needed at his visit in 10/22.  He did not see any improvement in his erectile dysfunction with the Levitra.  He continued to have difficulty achieving and maintaining his erections.  No decrease in his libido.   He was started on penile injections with Trimix 10/30/1 0.2 mL/inj. at his visit in March 2023.  He has used the Trimix at a dosage of 0.2 mL at home on 2 occasions.  He noted a good result with a satisfactory erection.  He did not have any difficulty with the erection.  No prolonged erection.  He has not used the medication recently due to other medical issues.   Portions of the above documentation were copied from a prior visit for review purposes only.  Past Medical History:  Past Medical History:  Diagnosis Date   Acute diastolic CHF (congestive heart failure) ([place]) 06/11/2012   EF 50-55% [facility])   Chronic kidney disease    COPD (chronic obstructive pulmonary disease) (HCC)    Diabetes  mellitus    A1c 11.5 (06/11/2012).   Dyspnea    Gout    Hepatic steatosis 06/11/2012   Elevated LFTs   Hyperlipemia    Malignant hypertension    Microcytic anemia 06/12/2012   Obesity     Past Surgical History:  Past Surgical History:  Procedure Laterality Date   AV FISTULA PLACEMENT Left 11/11/2019   Procedure: ARTERIOVENOUS (AV) FISTULA CREATION LEFT BRACHIOCEPHALIC ARM;  Surgeon: Serafina Mitchell, MD;  Location: [place];  Service: Vascular;  Laterality: Left;   COLONOSCOPY WITH PROPOFOL N/A 09/02/2021   Procedure: COLONOSCOPY WITH PROPOFOL;  Surgeon: Jonathon Bellows, MD;  Location: [facility] ENDOSCOPY;  Service: Gastroenterology;  Laterality: N/A;   IR FLUORO GUIDE CV LINE RIGHT  07/08/2019   IR REMOVAL TUN CV CATH W/O FL  03/23/2020   IR US GUIDE VASC ACCESS RIGHT  07/08/2019   LEFT HEART CATH AND CORONARY ANGIOGRAPHY N/A 06/12/2021   Procedure: LEFT HEART CATH AND CORONARY ANGIOGRAPHY;  Surgeon: Belva Crome, MD;  Location: [place] CV LAB;  Service: Cardiovascular;  Laterality: N/A;   NO PAST SURGERIES     VASCULAR SURGERY      Allergies:  Allergies  Allergen Reactions   Hydralazine Anxiety    Hallucinations    Lisinopril Swelling    Family History:  Family History  Problem Relation Age of Onset   Gout Mother    Asthma Mother    Diabetes Father  Heart failure Father    Diabetes Sister    Hypertension Brother    Pancreatic cancer Brother    Diabetes Sister     Social History:  Social History   Tobacco Use   Smoking status: Never    Passive exposure: Past   Smokeless tobacco: Never  Vaping Use   Vaping Use: Never used  Substance Use Topics   Alcohol use: Not Currently    Comment: haven't drank in over 5 years    Drug use: No    ROS: Constitutional:  Negative for fever, chills, weight loss CV: Negative for chest pain, previous MI, hypertension Respiratory:  Negative for shortness of breath, wheezing, sleep apnea, frequent cough GI:  Negative for nausea,  vomiting, bloody stool, GERD  Physical exam: BP 130/74   Pulse 91  GENERAL APPEARANCE:  Well appearing, well developed, well nourished, NAD HEENT:  Atraumatic, normocephalic, oropharynx clear NECK:  Supple without lymphadenopathy or thyromegaly ABDOMEN:  Soft, non-tender, no masses EXTREMITIES:  Moves all extremities well, without clubbing, cyanosis, or edema NEUROLOGIC:  Alert and oriented x 3, normal gait, CN II-XII grossly intact MENTAL STATUS:  appropriate BACK:  Non-tender to palpation, No CVAT SKIN:  Warm, dry, and intact   Results: No specimen obtained

## 2022-04-03 ENCOUNTER — Other Ambulatory Visit: Payer: Self-pay

## 2022-04-03 ENCOUNTER — Encounter (HOSPITAL_COMMUNITY): Payer: Self-pay

## 2022-04-03 ENCOUNTER — Emergency Department (HOSPITAL_COMMUNITY)
Admission: EM | Admit: 2022-04-03 | Discharge: 2022-04-03 | Disposition: A | Discharge status: Home / Self Care | Payer: Medicare Other | Attending: Emergency Medicine | Admitting: Emergency Medicine

## 2022-04-03 DIAGNOSIS — I110 Hypertensive heart disease with heart failure: Secondary | ICD-10-CM | POA: Diagnosis not present

## 2022-04-03 DIAGNOSIS — K0889 Other specified disorders of teeth and supporting structures: Secondary | ICD-10-CM | POA: Insufficient documentation

## 2022-04-03 DIAGNOSIS — E119 Type 2 diabetes mellitus without complications: Secondary | ICD-10-CM | POA: Insufficient documentation

## 2022-04-03 DIAGNOSIS — Z79899 Other long term (current) drug therapy: Secondary | ICD-10-CM | POA: Insufficient documentation

## 2022-04-03 DIAGNOSIS — I509 Heart failure, unspecified: Secondary | ICD-10-CM | POA: Diagnosis not present

## 2022-04-03 DIAGNOSIS — J449 Chronic obstructive pulmonary disease, unspecified: Secondary | ICD-10-CM | POA: Insufficient documentation

## 2022-04-03 DIAGNOSIS — Z794 Long term (current) use of insulin: Secondary | ICD-10-CM | POA: Diagnosis not present

## 2022-04-03 DIAGNOSIS — Z7982 Long term (current) use of aspirin: Secondary | ICD-10-CM | POA: Diagnosis not present

## 2022-04-03 MED ORDER — HYDROCODONE-ACETAMINOPHEN 5-325 MG PO TABS
2.0000 | ORAL_TABLET | Freq: Once | ORAL | Status: AC
  Administered 2022-04-03: 2 via ORAL
  Filled 2022-04-03: qty 2

## 2022-04-03 MED ORDER — HYDROCODONE-ACETAMINOPHEN 5-325 MG PO TABS
2.0000 | ORAL_TABLET | ORAL | 0 refills | Status: DC | PRN
Start: 2022-04-03 — End: 2022-09-20

## 2022-04-03 MED ORDER — AMOXICILLIN 500 MG PO CAPS: 500.0000 mg | ORAL_CAPSULE | Freq: Three times a day (TID) | ORAL | 0 refills | Status: DC

## 2022-04-03 MED ORDER — AMOXICILLIN 250 MG PO CAPS
500.0000 mg | ORAL_CAPSULE | Freq: Once | ORAL | Status: AC
  Administered 2022-04-03: 500 mg via ORAL
  Filled 2022-04-03: qty 2

## 2022-04-03 NOTE — Discharge Instructions (Addendum)
You were seen in the emergency department today for dental pain.  As we discussed I think that you might be developing some infection in your gum.  I am giving you a prescription for antibiotic medicine as well as some pain medicine.  I have attached some options for some dentists in the area.  It is incredibly important that you follow-up with the dentist.  Continue to monitor how you're doing and return to the ER for new or worsening symptoms.

## 2022-04-03 NOTE — ED Provider Notes (Signed)
[facility] EMERGENCY DEPARTMENT Provider Note   CSN: [id number] Arrival date & time: 04/03/22  2107     History  Chief Complaint  Patient presents with   Dental Pain    Kelly Key is a 51 y.o. male who presents the emergency department complaining of dental pain.  Patient states that he has had similar symptoms with dental infections in the past, and this pain has been bothering him for the past week.  He has not seen a dentist in years.  He normally wears dentures on his top teeth, and has more irritation after taking them off especially today.  No fever, no difficulty swallowing or breathing.  Has been taking Tylenol and naproxen with some relief.   Dental Pain Associated symptoms: no fever        Home Medications Prior to Admission medications   Medication Sig Start Date End Date Taking? Authorizing Provider  amoxicillin (AMOXIL) 500 MG capsule Take 1 capsule (500 mg total) by mouth 3 (three) times daily. 04/03/22  Yes [place] T, PA-C  HYDROcodone-acetaminophen (NORCO/VICODIN) 5-325 MG tablet Take 2 tablets by mouth every 4 (four) hours as needed. 04/03/22  Yes Jamillah Camilo T, PA-C  acetaminophen (TYLENOL) 500 MG tablet Take 1,000 mg by mouth every 6 (six) hours as needed for moderate pain or headache.    [provider]  albuterol (VENTOLIN HFA) 108 (90 Base) MCG/ACT inhaler Inhale 2 puffs into the lungs every 4 (four) hours as needed for wheezing or shortness of breath.  03/24/19   [provider]  allopurinol (ZYLOPRIM) 100 MG tablet Take 2 tablets (200 mg total) by mouth daily. 08/04/19   Love, Ivan Anchors, PA-C  AMBULATORY NON FORMULARY MEDICATION Inject 0.2 ml by intracavernosal route as directed.  Medication Name: TriMix PGE 10 mcg Pap   30 mg Phent 1 mg 01/01/22   Stoneking, Reece Leader., MD  amLODipine (NORVASC) 10 MG tablet Take 0.5 tablets (5 mg total) by mouth daily. 05/16/21 12/25/21  Shahmehdi, Valeria Batman, MD  amLODipine (NORVASC) 5 MG tablet Take  5 mg by mouth daily. 01/30/22   [provider]  aspirin EC 81 MG EC tablet Take 1 tablet (81 mg total) by mouth daily. 08/04/19   Love, Ivan Anchors, PA-C  atorvastatin (LIPITOR) 80 MG tablet Take 1 tablet (80 mg total) by mouth daily. 12/05/19   Arnoldo Lenis, MD  Calcium Carbonate Antacid (CALCIUM CARBONATE, DOSED IN MG ELEMENTAL CALCIUM,) 1250 MG/5ML SUSP Take 5 mLs (500 mg of elemental calcium total) by mouth every 6 (six) hours as needed for indigestion. 07/16/19   Amin, Jeanella Flattery, MD  carvedilol (COREG) 25 MG tablet Take 1 tablet (25 mg total) by mouth 2 (two) times daily with a meal. 08/03/19   Love, Ivan Anchors, PA-C  cloNIDine (CATAPRES) 0.3 MG tablet Take 0.3 mg by mouth 3 (three) times daily.    [provider]  diclofenac sodium (VOLTAREN) 1 % GEL Apply 2 g topically 4 (four) times daily. 08/03/19   Love, Ivan Anchors, PA-C  esomeprazole (NEXIUM) 20 MG capsule Take 20 mg by mouth daily. 01/23/22   [provider]  ezetimibe (ZETIA) 10 MG tablet Take 10 mg by mouth every evening. 01/20/19   [provider]  fluticasone (FLONASE) 50 MCG/ACT nasal spray Place 1 spray into both nostrils daily as needed for allergies or rhinitis.    [provider]  furosemide (LASIX) 80 MG tablet Take 1 tablet (80 mg total) by mouth daily.  05/16/21 12/25/21  Shahmehdi, Valeria Batman, MD  gabapentin (NEURONTIN) 100 MG capsule Take 1 capsule (100 mg total) by mouth 3 (three) times daily. 08/03/19   Love, Ivan Anchors, PA-C  glucose blood (ACCU-CHEK AVIVA PLUS) test strip Use as instructed TO CHECK BLOOD GLUCOSE THREE TIMES DAILY 08/15/20   Cassandria Anger, MD  hydrOXYzine (ATARAX) 25 MG tablet Take 25 mg by mouth 3 (three) times daily as needed. 11/28/21   [provider]  Insulin Pen Needle (B-D ULTRAFINE III SHORT PEN) 31G X 8 MM MISC 1 each by Does not apply route as directed. 08/13/20   Cassandria Anger, MD  LEVEMIR FLEXTOUCH 100 UNIT/ML FlexTouch Pen INJECT 50 UNITS  SUBCUTANEOUSLY AT BEDTIME 07/09/21   Brita Romp, NP  linaclotide [facility]) 145 MCG CAPS capsule Take 1 capsule (145 mcg total) by mouth daily before breakfast. 04/05/18   Manuella Ghazi, Pratik D, DO  multivitamin (RENA-VIT) TABS tablet Take 1 tablet by mouth at bedtime. 08/03/19   Love, Ivan Anchors, PA-C  Na Sulfate-K Sulfate-Mg Sulf (SUPREP BOWEL PREP KIT) 17.5-3.13-1.6 GM/177ML SOLN Take 1 kit by mouth as directed. 04/11/21   Lucilla Lame, MD  NOVOLOG FLEXPEN 100 UNIT/ML FlexPen INJECT 8 TO 14 UNITS SUBCUTANEOUSLY THREE TIMES DAILY WITH MEALS 07/09/21   Brita Romp, NP  oxyCODONE-acetaminophen (PERCOCET) 5-325 MG tablet Take 1-2 tablets by mouth every 6 (six) hours as needed. 04/24/21   Veryl Speak, MD  OZEMPIC, 0.25 OR 0.5 MG/DOSE, 2 MG/1.5ML SOPN Inject 0.25 mg into the skin every Friday. 11/23/20   [provider]  sevelamer carbonate (RENVELA) 800 MG tablet Take 3 tablets (2,400 mg total) by mouth 3 (three) times daily with meals. 08/03/19   Love, Ivan Anchors, PA-C  SSD 1 % cream Apply topically 2 (two) times daily. 11/11/21   [provider]  valACYclovir (VALTREX) 1000 MG tablet Take 1 tablet (1,000 mg total) by mouth 3 (three) times daily. 04/24/21   Veryl Speak, MD  vardenafil (LEVITRA) 20 MG tablet Take 1 tablet (20 mg total) by mouth daily as needed for erectile dysfunction. 08/16/21   Stoneking, Reece Leader., MD      Allergies    Hydralazine and Lisinopril    Review of Systems   Review of Systems  Constitutional:  Negative for fever.  HENT:  Positive for dental problem. Negative for sore throat and trouble swallowing.   Respiratory:  Negative for shortness of breath.   All other systems reviewed and are negative.   Physical Exam Updated Vital Signs BP 104/78 (BP Location: Right Arm)   Pulse 89   Temp 98 F (36.7 C) (Oral)   Resp 18   Ht 6' 1"  (1.854 m)   Wt 135.2 kg   SpO2 98%   BMI 39.32 kg/m  Physical Exam Vitals and nursing note reviewed.   Constitutional:      Appearance: Normal appearance.  HENT:     Head: Normocephalic and atraumatic.     Mouth/Throat:     Comments: Upper dentures removed.  Erythema noted of the left upper gingiva, with questionable abscess formation.  No dental caries noted of the lower left teeth.  No PTA, mandibular or sublingual swelling.  No trismus. Eyes:     Conjunctiva/sclera: Conjunctivae normal.  Pulmonary:     Effort: Pulmonary effort is normal. No respiratory distress.  Skin:    General: Skin is warm and dry.  Neurological:     Mental Status: He is alert.  Psychiatric:  Mood and Affect: Mood normal.        Behavior: Behavior normal.     ED Results / Procedures / Treatments   Labs (all labs ordered are listed, but only abnormal results are displayed) Labs Reviewed - No data to display  EKG None  Radiology No results found.  Procedures Procedures    Medications Ordered in ED Medications  HYDROcodone-acetaminophen (NORCO/VICODIN) 5-325 MG per tablet 2 tablet (2 tablets Oral Given 04/03/22 2156)  amoxicillin (AMOXIL) capsule 500 mg (500 mg Oral Given 04/03/22 2156)    ED Course/ Medical Decision Making/ A&P                           Medical Decision Making Risk Prescription drug management.  This patient is a 51 y.o. male who presents to the ED for concern of dental pain.   Differential diagnoses prior to evaluation: Dental caries, dental abscess, PTA, RPA, ludwig's angina  Past Medical History / Co-morbidities / Social History: Hypertension, diabetes, hyperlipidemia, CHF, COPD  Physical Exam: Physical exam performed. The pertinent findings include: Upper dentures removed.  Erythema noted of the left upper gingiva, with questionable abscess formation.  No dental caries noted of the lower left teeth.  No PTA, mandibular or sublingual swelling.  No trismus.   Disposition: After consideration of the diagnostic results and the patients response to treatment, I feel  that patient's not requiring admission.  Will place on short course of antibiotics and pain medication for possible dental abscess and encourage follow-up with dentist.  Provided resource guide for dental providers. Discussed reasons to return to the emergency department, and the patient is agreeable to the plan.   Final Clinical Impression(s) / ED Diagnoses Final diagnoses:  Pain, dental    Rx / DC Orders ED Discharge Orders          Ordered    HYDROcodone-acetaminophen (NORCO/VICODIN) 5-325 MG tablet  Every 4 hours PRN        04/03/22 2157    amoxicillin (AMOXIL) 500 MG capsule  3 times daily        04/03/22 2157           Portions of this report may have been transcribed using voice recognition software. Every effort was made to ensure accuracy; however, inadvertent computerized transcription errors may be present.    Estill Cotta 04/03/22 2332    Sherwood Gambler, MD 04/04/22 [phone]

## 2022-04-03 NOTE — ED Triage Notes (Signed)
Pt reports left tooth pain. Pt unsure of what tooth is causing the issue. Pain x1 week

## 2022-04-07 ENCOUNTER — Encounter: Payer: Self-pay | Admitting: Pulmonary Disease

## 2022-07-11 ENCOUNTER — Telehealth: Payer: Self-pay

## 2022-07-11 NOTE — Telephone Encounter (Signed)
Pt's significant other, [name], called requesting a call back.  Reviewed pt's chart, returned call for clarification, two identifiers used. She stated that the past 2 times the pt has had HD treatment, he has had prolonged hemostasis, yesterday was nearly 1 1/2 hours. Asked for dialysis center information.  Called [facility] in [place], informed by Caryl Asp that she had faxed a referral to [facility] so pt could be seen earlier than our office. She is going to f/u with them to ensure they got the fax and will contact pt to set up appt. Confirmed understanding.

## 2022-07-28 ENCOUNTER — Encounter (HOSPITAL_COMMUNITY): Payer: Self-pay

## 2022-07-28 ENCOUNTER — Other Ambulatory Visit: Payer: Self-pay

## 2022-07-28 ENCOUNTER — Emergency Department (HOSPITAL_COMMUNITY): Payer: Medicare Other

## 2022-07-28 ENCOUNTER — Emergency Department (HOSPITAL_COMMUNITY)
Admission: EM | Admit: 2022-07-28 | Discharge: 2022-07-28 | Disposition: A | Discharge status: Home / Self Care | Payer: Medicare Other | Attending: Emergency Medicine | Admitting: Emergency Medicine

## 2022-07-28 DIAGNOSIS — N189 Chronic kidney disease, unspecified: Secondary | ICD-10-CM | POA: Insufficient documentation

## 2022-07-28 DIAGNOSIS — J449 Chronic obstructive pulmonary disease, unspecified: Secondary | ICD-10-CM | POA: Diagnosis not present

## 2022-07-28 DIAGNOSIS — R0601 Orthopnea: Secondary | ICD-10-CM

## 2022-07-28 DIAGNOSIS — Z955 Presence of coronary angioplasty implant and graft: Secondary | ICD-10-CM | POA: Diagnosis not present

## 2022-07-28 DIAGNOSIS — I5031 Acute diastolic (congestive) heart failure: Secondary | ICD-10-CM | POA: Insufficient documentation

## 2022-07-28 DIAGNOSIS — I130 Hypertensive heart and chronic kidney disease with heart failure and stage 1 through stage 4 chronic kidney disease, or unspecified chronic kidney disease: Secondary | ICD-10-CM | POA: Insufficient documentation

## 2022-07-28 DIAGNOSIS — E119 Type 2 diabetes mellitus without complications: Secondary | ICD-10-CM | POA: Diagnosis not present

## 2022-07-28 DIAGNOSIS — R0602 Shortness of breath: Secondary | ICD-10-CM | POA: Diagnosis present

## 2022-07-28 LAB — BASIC METABOLIC PANEL
Anion gap: 12 (ref 5–15)
BUN: 54 mg/dL — ABNORMAL HIGH (ref 6–20)
CO2: 28 mmol/L (ref 22–32)
Calcium: 9.5 mg/dL (ref 8.9–10.3)
Chloride: 97 mmol/L — ABNORMAL LOW (ref 98–111)
Creatinine, Ser: 9.89 mg/dL — ABNORMAL HIGH (ref 0.61–1.24)
GFR, Estimated: 6 mL/min — ABNORMAL LOW (ref >60)
Glucose, Bld: 113 mg/dL — ABNORMAL HIGH (ref 70–99)
Potassium: 4.5 mmol/L (ref 3.5–5.1)
Sodium: 137 mmol/L (ref 135–145)

## 2022-07-28 LAB — CBC
HCT: 30.5 % — ABNORMAL LOW (ref 39.0–52.0)
Hemoglobin: 9.6 g/dL — ABNORMAL LOW (ref 13.0–17.0)
MCH: 28.9 pg (ref 26.0–34.0)
MCHC: 31.5 g/dL (ref 30.0–36.0)
MCV: 91.9 fL (ref 80.0–100.0)
Platelets: 197 10*3/uL (ref 150–400)
RBC: 3.32 MIL/uL — ABNORMAL LOW (ref 4.22–5.81)
RDW: 14.9 % (ref 11.5–15.5)
WBC: 7.3 10*3/uL (ref 4.0–10.5)
nRBC: 0 % (ref 0.0–0.2)

## 2022-07-28 LAB — TROPONIN I (HIGH SENSITIVITY): Troponin I (High Sensitivity): 20 ng/L — ABNORMAL HIGH (ref <18)

## 2022-07-28 LAB — BRAIN NATRIURETIC PEPTIDE: B Natriuretic Peptide: 192 pg/mL — ABNORMAL HIGH (ref 0.0–100.0)

## 2022-07-28 MED ORDER — FUROSEMIDE 10 MG/ML IJ SOLN
80.0000 mg | Freq: Once | INTRAMUSCULAR | Status: AC
  Administered 2022-07-28: 80 mg via INTRAVENOUS
  Filled 2022-07-28: qty 8

## 2022-07-28 NOTE — Discharge Instructions (Signed)
You were evaluated in the Emergency Department and after careful evaluation, we did not find any emergent condition requiring admission or further testing in the hospital.  Your exam/testing today is overall reassuring.  Recommend restricting your fluid intake today as we discussed and following up with your normal dialysis.  Please return to the Emergency Department if you experience any worsening of your condition.   Thank you for allowing [place] to be a part of your care.

## 2022-07-28 NOTE — ED Triage Notes (Signed)
POV from home. Cc of SOB. Felt worse when he laid down.  Ambulatory to triage. Says he drank too much water and now he is overloaded. Dialysis- Tu-Thurs-Sat. Left arm restriction. Did not miss any appointments.  95% after ambulating. 98% resting

## 2022-07-28 NOTE — ED Provider Notes (Signed)
[facility] Emergency Department Provider Note MRN:  [id number]  Arrival date & time: 07/28/22     Chief Complaint   Shortness of Breath   History of Present Illness   Kelly Key is a 51 y.o. year-old male with a history of CHF, COPD, ESRD, diabetes presenting to the ED with chief complaint of shortness of breath.  Patient explains that he thinks he drank too much fluid today.  Short of breath when lying flat trying to sleep.  Has had this happen to him before.  Wanted to come get checked out before he got worse.  Needed the pressure mask last time and did not like it.  Denies chest pain.  Review of Systems  A thorough review of systems was obtained and all systems are negative except as noted in the HPI and PMH.   Patient's Health History    Past Medical History:  Diagnosis Date   Acute diastolic CHF (congestive heart failure) ([place]) 06/11/2012   EF 50-55% [facility])   Chronic kidney disease    COPD (chronic obstructive pulmonary disease) (HCC)    Diabetes mellitus    A1c 11.5 (06/11/2012).   Dyspnea    Gout    Hepatic steatosis 06/11/2012   Elevated LFTs   Hyperlipemia    Malignant hypertension    Microcytic anemia 06/12/2012   Obesity     Past Surgical History:  Procedure Laterality Date   AV FISTULA PLACEMENT Left 11/11/2019   Procedure: ARTERIOVENOUS (AV) FISTULA CREATION LEFT BRACHIOCEPHALIC ARM;  Surgeon: Serafina Mitchell, MD;  Location: [place];  Service: Vascular;  Laterality: Left;   COLONOSCOPY WITH PROPOFOL N/A 09/02/2021   Procedure: COLONOSCOPY WITH PROPOFOL;  Surgeon: Jonathon Bellows, MD;  Location: [facility] ENDOSCOPY;  Service: Gastroenterology;  Laterality: N/A;   IR FLUORO GUIDE CV LINE RIGHT  07/08/2019   IR REMOVAL TUN CV CATH W/O FL  03/23/2020   IR US GUIDE VASC ACCESS RIGHT  07/08/2019   LEFT HEART CATH AND CORONARY ANGIOGRAPHY N/A 06/12/2021   Procedure: LEFT HEART CATH AND CORONARY ANGIOGRAPHY;  Surgeon: Belva Crome, MD;   Location: [place] CV LAB;  Service: Cardiovascular;  Laterality: N/A;   NO PAST SURGERIES     VASCULAR SURGERY      Family History  Problem Relation Age of Onset   Gout Mother    Asthma Mother    Diabetes Father    Heart failure Father    Diabetes Sister    Hypertension Brother    Pancreatic cancer Brother    Diabetes Sister     Social History   Socioeconomic History   Marital status: Single    Spouse name: Not on file   Number of children: Not on file   Years of education: Not on file   Highest education level: Not on file  Occupational History   Occupation: retired  Tobacco Use   Smoking status: Never    Passive exposure: Past   Smokeless tobacco: Never  Vaping Use   Vaping Use: Never used  Substance and Sexual Activity   Alcohol use: Not Currently    Comment: haven't drank in over 5 years    Drug use: No   Sexual activity: Not Currently  Other Topics Concern   Not on file  Social History Narrative   Not on file   Social Determinants of Health   Financial Resource Strain: Low Risk  (02/20/2019)   Overall Financial Resource Strain (CARDIA)    Difficulty  of Paying Living Expenses: Not hard at all  Food Insecurity: Unknown (02/20/2019)   Hunger Vital Sign    Worried About Running Out of Food in the Last Year: Patient refused    [place] in the Last Year: Patient refused  Transportation Needs: Unknown (02/20/2019)   [place] - Transportation    Lack of Transportation (Medical): Patient refused    Lack of Transportation (Non-Medical): Patient refused  Physical Activity: Unknown (02/20/2019)   Exercise Vital Sign    Days of Exercise per Week: Patient refused    Minutes of Exercise per Session: Patient refused  Stress: No Stress Concern Present (02/20/2019)   [employer] [place] of Stress : Not at all  Social Connections: Unknown (02/20/2019)   Social Connection and Isolation Panel  [NHANES]    Frequency of Communication with Friends and Family: Patient refused    Frequency of Social Gatherings with Friends and Family: Patient refused    Attends Religious Services: Patient refused    Active Member of Clubs or Organizations: Patient refused    Attends Archivist Meetings: Patient refused    Marital Status: Patient refused  Intimate Partner Violence: Unknown (02/20/2019)   Humiliation, Afraid, Rape, and Kick questionnaire    Fear of Current or Ex-Partner: Patient refused    Emotionally Abused: Patient refused    Physically Abused: Patient refused    Sexually Abused: Patient refused     Physical Exam   Vitals:   07/28/22 0310 07/28/22 0430  BP:  (!) 155/95  Pulse: 83 82  Resp: 14 13  Temp:    SpO2: 94% 98%    CONSTITUTIONAL: Well-appearing, NAD NEURO/PSYCH:  Alert and oriented x 3, no focal deficits EYES:  eyes equal and reactive ENT/NECK:  no LAD, no JVD CARDIO: Regular rate, well-perfused, normal S1 and S2 PULM:  CTAB no wheezing or rhonchi GI/GU:  non-distended, non-tender MSK/SPINE:  No gross deformities, no edema SKIN:  no rash, atraumatic   *Additional and/or pertinent findings included in MDM below  Diagnostic and Interventional Summary    EKG Interpretation  Date/Time:  Monday July 28 2022 01:57:53 EDT Ventricular Rate:  84 PR Interval:  219 QRS Duration: 127 QT Interval:  428 QTC Calculation: 506 R Axis:   92 Text Interpretation: Sinus rhythm Prolonged PR interval Nonspecific intraventricular conduction delay Anterior infarct, old Baseline wander in lead(s) II V3 Confirmed by Gerlene Fee [phone]) on 07/28/2022 3:28:47 AM       Labs Reviewed  CBC - Abnormal; Notable for the following components:      Result Value   RBC 3.32 (*)    Hemoglobin 9.6 (*)    HCT 30.5 (*)    All other components within normal limits  BASIC METABOLIC PANEL - Abnormal; Notable for the following components:   Chloride 97 (*)    Glucose, Bld  113 (*)    BUN 54 (*)    Creatinine, Ser 9.89 (*)    GFR, Estimated 6 (*)    All other components within normal limits  BRAIN NATRIURETIC PEPTIDE - Abnormal; Notable for the following components:   B Natriuretic Peptide 192.0 (*)    All other components within normal limits  TROPONIN I (HIGH SENSITIVITY) - Abnormal; Notable for the following components:   Troponin I (High Sensitivity) 20 (*)    All other components within normal limits    DG Chest [facility] 1 View  Final Result  Medications  furosemide (LASIX) injection 80 mg (80 mg Intravenous Given 07/28/22 0327)     Procedures  /  Critical Care Procedures  ED Course and Medical Decision Making  Initial Impression and Ddx Suspect pulmonary edema given the history, reassuring vital signs, no hypoxia on room air.  Providing some Lasix, screening labs, EKG, chest x-ray, will reassess.  Doubt ACS, doubt PE  Past medical/surgical history that increases complexity of ED encounter: ESRD  Interpretation of Diagnostics I personally reviewed the EKG and my interpretation is as follows: Sinus rhythm, prolonged PR  Labs reveal no significant blood count or electrolyte disturbance, BUN/creatinine at or near recent levels, troponin minimally elevated, less than his recent baseline.  BNP minimally elevated as well.  Chest x-ray without obvious abnormality.  Patient Reassessment and Ultimate Disposition/Management     Patient able to make some urine after Lasix, feeling a bit better, continues to have normal vital signs, appropriate for discharge.  Patient management required discussion with the following services or consulting groups:  None  Complexity of Problems Addressed Acute illness or injury that poses threat of life of bodily function  Additional Data Reviewed and Analyzed Further history obtained from: Further history from spouse/family member  Additional Factors Impacting ED Encounter Risk Consideration of  hospitalization  Barth Kirks. [place] Small, [place] [email or web address]  Final Clinical Impressions(s) / ED Diagnoses     ICD-10-CM   1. Orthopnea  R06.01       ED Discharge Orders     None        Discharge Instructions Discussed with and Provided to Patient:     Discharge Instructions      You were evaluated in the Emergency Department and after careful evaluation, we did not find any emergent condition requiring admission or further testing in the hospital.  Your exam/testing today is overall reassuring.  Recommend restricting your fluid intake today as we discussed and following up with your normal dialysis.  Please return to the Emergency Department if you experience any worsening of your condition.   Thank you for allowing [place] to be a part of your care.       Maudie Flakes, MD 07/28/22 [phone]

## 2022-09-20 ENCOUNTER — Emergency Department (HOSPITAL_COMMUNITY)
Admission: EM | Admit: 2022-09-20 | Discharge: 2022-09-20 | Disposition: A | Discharge status: Home / Self Care | Payer: Medicare Other | Attending: Emergency Medicine | Admitting: Emergency Medicine

## 2022-09-20 ENCOUNTER — Encounter (HOSPITAL_COMMUNITY): Payer: Self-pay | Admitting: Emergency Medicine

## 2022-09-20 ENCOUNTER — Other Ambulatory Visit: Payer: Self-pay

## 2022-09-20 ENCOUNTER — Emergency Department (HOSPITAL_COMMUNITY): Payer: Medicare Other

## 2022-09-20 DIAGNOSIS — Z992 Dependence on renal dialysis: Secondary | ICD-10-CM | POA: Insufficient documentation

## 2022-09-20 DIAGNOSIS — R0609 Other forms of dyspnea: Secondary | ICD-10-CM | POA: Insufficient documentation

## 2022-09-20 DIAGNOSIS — R0601 Orthopnea: Secondary | ICD-10-CM | POA: Insufficient documentation

## 2022-09-20 DIAGNOSIS — Z7982 Long term (current) use of aspirin: Secondary | ICD-10-CM | POA: Diagnosis not present

## 2022-09-20 DIAGNOSIS — Z79899 Other long term (current) drug therapy: Secondary | ICD-10-CM | POA: Insufficient documentation

## 2022-09-20 DIAGNOSIS — Z7984 Long term (current) use of oral hypoglycemic drugs: Secondary | ICD-10-CM | POA: Diagnosis not present

## 2022-09-20 DIAGNOSIS — Z794 Long term (current) use of insulin: Secondary | ICD-10-CM | POA: Insufficient documentation

## 2022-09-20 DIAGNOSIS — N186 End stage renal disease: Secondary | ICD-10-CM | POA: Diagnosis not present

## 2022-09-20 DIAGNOSIS — I509 Heart failure, unspecified: Secondary | ICD-10-CM | POA: Diagnosis not present

## 2022-09-20 DIAGNOSIS — I132 Hypertensive heart and chronic kidney disease with heart failure and with stage 5 chronic kidney disease, or end stage renal disease: Secondary | ICD-10-CM | POA: Diagnosis not present

## 2022-09-20 DIAGNOSIS — E1122 Type 2 diabetes mellitus with diabetic chronic kidney disease: Secondary | ICD-10-CM | POA: Diagnosis not present

## 2022-09-20 DIAGNOSIS — R0602 Shortness of breath: Secondary | ICD-10-CM | POA: Insufficient documentation

## 2022-09-20 LAB — CBC WITH DIFFERENTIAL/PLATELET
Abs Immature Granulocytes: 0.02 10*3/uL (ref 0.00–0.07)
Basophils Absolute: 0.1 10*3/uL (ref 0.0–0.1)
Basophils Relative: 1 %
Eosinophils Absolute: 0.5 10*3/uL (ref 0.0–0.5)
Eosinophils Relative: 5 %
HCT: 33.4 % — ABNORMAL LOW (ref 39.0–52.0)
Hemoglobin: 10.7 g/dL — ABNORMAL LOW (ref 13.0–17.0)
Immature Granulocytes: 0 %
Lymphocytes Relative: 16 %
Lymphs Abs: 1.7 10*3/uL (ref 0.7–4.0)
MCH: 28.5 pg (ref 26.0–34.0)
MCHC: 32 g/dL (ref 30.0–36.0)
MCV: 88.8 fL (ref 80.0–100.0)
Monocytes Absolute: 0.8 10*3/uL (ref 0.1–1.0)
Monocytes Relative: 7 %
Neutro Abs: 7.4 10*3/uL (ref 1.7–7.7)
Neutrophils Relative %: 71 %
Platelets: 232 10*3/uL (ref 150–400)
RBC: 3.76 MIL/uL — ABNORMAL LOW (ref 4.22–5.81)
RDW: 14.4 % (ref 11.5–15.5)
WBC: 10.4 10*3/uL (ref 4.0–10.5)
nRBC: 0 % (ref 0.0–0.2)

## 2022-09-20 LAB — BASIC METABOLIC PANEL
Anion gap: 13 (ref 5–15)
BUN: 63 mg/dL — ABNORMAL HIGH (ref 6–20)
CO2: 25 mmol/L (ref 22–32)
Calcium: 9.5 mg/dL (ref 8.9–10.3)
Chloride: 97 mmol/L — ABNORMAL LOW (ref 98–111)
Creatinine, Ser: 10.69 mg/dL — ABNORMAL HIGH (ref 0.61–1.24)
GFR, Estimated: 5 mL/min — ABNORMAL LOW (ref >60)
Glucose, Bld: 164 mg/dL — ABNORMAL HIGH (ref 70–99)
Potassium: 4.7 mmol/L (ref 3.5–5.1)
Sodium: 135 mmol/L (ref 135–145)

## 2022-09-20 LAB — HEPATITIS B SURFACE ANTIGEN: Hepatitis B Surface Ag: NONREACTIVE

## 2022-09-20 MED ORDER — LABETALOL HCL 5 MG/ML IV SOLN
10.0000 mg | Freq: Once | INTRAVENOUS | Status: AC
  Administered 2022-09-20: 10 mg via INTRAVENOUS
  Filled 2022-09-20: qty 4

## 2022-09-20 MED ORDER — LABETALOL HCL 5 MG/ML IV SOLN
20.0000 mg | Freq: Once | INTRAVENOUS | Status: AC
  Administered 2022-09-20: 20 mg via INTRAVENOUS

## 2022-09-20 MED ORDER — LIDOCAINE-PRILOCAINE 2.5-2.5 % EX CREA: 1.0000 | TOPICAL_CREAM | CUTANEOUS | Status: DC | PRN

## 2022-09-20 MED ORDER — LABETALOL HCL 5 MG/ML IV SOLN
20.0000 mg | Freq: Once | INTRAVENOUS | Status: AC
  Administered 2022-09-20: 20 mg via INTRAVENOUS
  Filled 2022-09-20: qty 4

## 2022-09-20 MED ORDER — PENTAFLUOROPROP-TETRAFLUOROETH EX AERO: 1.0000 | INHALATION_SPRAY | CUTANEOUS | Status: DC | PRN

## 2022-09-20 MED ORDER — LIDOCAINE HCL (PF) 1 % IJ SOLN: 5.0000 mL | INTRAMUSCULAR | Status: DC | PRN

## 2022-09-20 MED ORDER — IPRATROPIUM-ALBUTEROL 0.5-2.5 (3) MG/3ML IN SOLN
3.0000 mL | Freq: Once | RESPIRATORY_TRACT | Status: AC
  Administered 2022-09-20: 3 mL via RESPIRATORY_TRACT
  Filled 2022-09-20: qty 3

## 2022-09-20 MED ORDER — ACETAMINOPHEN 500 MG PO TABS
1000.0000 mg | ORAL_TABLET | Freq: Once | ORAL | Status: AC
  Administered 2022-09-20: 1000 mg via ORAL
  Filled 2022-09-20: qty 2

## 2022-09-20 NOTE — ED Provider Notes (Signed)
[facility] EMERGENCY DEPARTMENT Provider Note   CSN: [id number] Arrival date & time: 09/20/22  0115     History  Chief Complaint  Patient presents with   Shortness of Breath    Kelly Key is a 51 y.o. male.  Patient is a 51 year old male with past medical history of end-stage renal disease on hemodialysis.  Patient also with history of congestive heart failure, poorly controlled type 2 diabetes.  He presents today with complaints of shortness of breath that has been worsening over the past 24 hours.  He normally attends dialysis Tuesday, Thursday, and Saturday.  His Thursday dialysis this week was moved to Wednesday due to Thursday being Thanksgiving.  Earlier today, he began feeling short of breath.  This is worse when he exerts himself and when he lies flat.  He denies to me he is having any leg swelling.  He denies any chest pain, fevers, chills, or productive cough.  The history is provided by the patient.  Shortness of Breath Severity:  Moderate Onset quality:  Gradual Duration:  1 day Timing:  Constant Progression:  Worsening Chronicity:  New Context: activity   Relieved by:  Nothing      Home Medications Prior to Admission medications   Medication Sig Start Date End Date Taking? Authorizing Provider  acetaminophen (TYLENOL) 500 MG tablet Take 1,000 mg by mouth every 6 (six) hours as needed for moderate pain or headache.    [provider]  albuterol (VENTOLIN HFA) 108 (90 Base) MCG/ACT inhaler Inhale 2 puffs into the lungs every 4 (four) hours as needed for wheezing or shortness of breath.  03/24/19   [provider]  allopurinol (ZYLOPRIM) 100 MG tablet Take 2 tablets (200 mg total) by mouth daily. 08/04/19   Love, Ivan Anchors, PA-C  AMBULATORY NON FORMULARY MEDICATION Inject 0.2 ml by intracavernosal route as directed.  Medication Name: TriMix PGE 10 mcg Pap   30 mg Phent 1 mg 01/01/22   Stoneking, Reece Leader., MD  amLODipine (NORVASC) 10 MG tablet  Take 0.5 tablets (5 mg total) by mouth daily. 05/16/21 12/25/21  Shahmehdi, Valeria Batman, MD  amLODipine (NORVASC) 5 MG tablet Take 5 mg by mouth daily. 01/30/22   [provider]  amoxicillin (AMOXIL) 500 MG capsule Take 1 capsule (500 mg total) by mouth 3 (three) times daily. 04/03/22   Roemhildt, Lorin T, PA-C  aspirin EC 81 MG EC tablet Take 1 tablet (81 mg total) by mouth daily. 08/04/19   Love, Ivan Anchors, PA-C  atorvastatin (LIPITOR) 80 MG tablet Take 1 tablet (80 mg total) by mouth daily. 12/05/19   Arnoldo Lenis, MD  Calcium Carbonate Antacid (CALCIUM CARBONATE, DOSED IN MG ELEMENTAL CALCIUM,) 1250 MG/5ML SUSP Take 5 mLs (500 mg of elemental calcium total) by mouth every 6 (six) hours as needed for indigestion. 07/16/19   Amin, Jeanella Flattery, MD  carvedilol (COREG) 25 MG tablet Take 1 tablet (25 mg total) by mouth 2 (two) times daily with a meal. 08/03/19   Love, Ivan Anchors, PA-C  cloNIDine (CATAPRES) 0.3 MG tablet Take 0.3 mg by mouth 3 (three) times daily.    [provider]  diclofenac sodium (VOLTAREN) 1 % GEL Apply 2 g topically 4 (four) times daily. 08/03/19   Love, Ivan Anchors, PA-C  esomeprazole (NEXIUM) 20 MG capsule Take 20 mg by mouth daily. 01/23/22   [provider]  ezetimibe (ZETIA) 10 MG tablet Take 10 mg by mouth every evening. 01/20/19   [provider]  fluticasone (FLONASE) 50 MCG/ACT nasal spray Place 1 spray into both nostrils daily as needed for allergies or rhinitis.    [provider]  furosemide (LASIX) 80 MG tablet Take 1 tablet (80 mg total) by mouth daily. 05/16/21 12/25/21  Shahmehdi, Valeria Batman, MD  gabapentin (NEURONTIN) 100 MG capsule Take 1 capsule (100 mg total) by mouth 3 (three) times daily. 08/03/19   Love, Ivan Anchors, PA-C  glucose blood (ACCU-CHEK AVIVA PLUS) test strip Use as instructed TO CHECK BLOOD GLUCOSE THREE TIMES DAILY 08/15/20   Nida, Marella Chimes, MD  HYDROcodone-acetaminophen (NORCO/VICODIN) 5-325 MG tablet Take 2 tablets by  mouth every 4 (four) hours as needed. 04/03/22   Roemhildt, Lorin T, PA-C  hydrOXYzine (ATARAX) 25 MG tablet Take 25 mg by mouth 3 (three) times daily as needed. 11/28/21   [provider]  Insulin Pen Needle (B-D ULTRAFINE III SHORT PEN) 31G X 8 MM MISC 1 each by Does not apply route as directed. 08/13/20   Cassandria Anger, MD  LEVEMIR FLEXTOUCH 100 UNIT/ML FlexTouch Pen INJECT 50 UNITS SUBCUTANEOUSLY AT BEDTIME 07/09/21   Brita Romp, NP  linaclotide [facility]) 145 MCG CAPS capsule Take 1 capsule (145 mcg total) by mouth daily before breakfast. 04/05/18   Manuella Ghazi, Pratik D, DO  multivitamin (RENA-VIT) TABS tablet Take 1 tablet by mouth at bedtime. 08/03/19   Love, Ivan Anchors, PA-C  Na Sulfate-K Sulfate-Mg Sulf (SUPREP BOWEL PREP KIT) 17.5-3.13-1.6 GM/177ML SOLN Take 1 kit by mouth as directed. 04/11/21   Lucilla Lame, MD  NOVOLOG FLEXPEN 100 UNIT/ML FlexPen INJECT 8 TO 14 UNITS SUBCUTANEOUSLY THREE TIMES DAILY WITH MEALS 07/09/21   Brita Romp, NP  oxyCODONE-acetaminophen (PERCOCET) 5-325 MG tablet Take 1-2 tablets by mouth every 6 (six) hours as needed. 04/24/21   Veryl Speak, MD  OZEMPIC, 0.25 OR 0.5 MG/DOSE, 2 MG/1.5ML SOPN Inject 0.25 mg into the skin every Friday. 11/23/20   [provider]  sevelamer carbonate (RENVELA) 800 MG tablet Take 3 tablets (2,400 mg total) by mouth 3 (three) times daily with meals. 08/03/19   Love, Ivan Anchors, PA-C  SSD 1 % cream Apply topically 2 (two) times daily. 11/11/21   [provider]  valACYclovir (VALTREX) 1000 MG tablet Take 1 tablet (1,000 mg total) by mouth 3 (three) times daily. 04/24/21   Veryl Speak, MD  vardenafil (LEVITRA) 20 MG tablet Take 1 tablet (20 mg total) by mouth daily as needed for erectile dysfunction. 08/16/21   Stoneking, Reece Leader., MD      Allergies    Hydralazine and Lisinopril    Review of Systems   Review of Systems  Respiratory:  Positive for shortness of breath.   All other systems reviewed and  are negative.   Physical Exam Updated Vital Signs BP (!) 171/120   Pulse 93   Temp 97.9 F (36.6 C) (Oral)   Resp (!) 22   Ht _0  (1.854 m)   Wt (!) 138.3 kg   SpO2 100%   BMI 40.23 kg/m  Physical Exam Vitals and nursing note reviewed.  Constitutional:      General: He is not in acute distress.    Appearance: He is well-developed. He is not diaphoretic.  HENT:     Head: Normocephalic and atraumatic.  Cardiovascular:     Rate and Rhythm: Normal rate and regular rhythm.     Heart sounds: No murmur heard.    No friction rub.  Pulmonary:  Effort: Pulmonary effort is normal. No respiratory distress.     Breath sounds: Examination of the right-lower field reveals rales. Examination of the left-lower field reveals rales. Rales present. No wheezing.  Abdominal:     General: Bowel sounds are normal. There is no distension.     Palpations: Abdomen is soft.     Tenderness: There is no abdominal tenderness.  Musculoskeletal:        General: Normal range of motion.     Cervical back: Normal range of motion and neck supple.     Right lower leg: Edema present.     Left lower leg: Edema present.     Comments: There is trace edema of both lower extremities.  Skin:    General: Skin is warm and dry.  Neurological:     Mental Status: He is alert and oriented to person, place, and time.     Coordination: Coordination normal.     ED Results / Procedures / Treatments   Labs (all labs ordered are listed, but only abnormal results are displayed) Labs Reviewed  BASIC METABOLIC PANEL  CBC WITH DIFFERENTIAL/PLATELET    EKG EKG Interpretation  Date/Time:  Saturday September 20 2022 01:25:41 EST Ventricular Rate:  94 PR Interval:  233 QRS Duration: 104 QT Interval:  373 QTC Calculation: 467 R Axis:   268 Text Interpretation: Sinus rhythm Prolonged PR interval LAD, consider left anterior fascicular block Probable anteroseptal infarct, old No significant change since 07/28/2022  Confirmed by Veryl Speak [phone]) on 09/20/2022 1:59:26 AM  Radiology No results found.  Procedures Procedures    Medications Ordered in ED Medications  ipratropium-albuterol (DUONEB) 0.5-2.5 (3) MG/3ML nebulizer solution 3 mL (has no administration in time range)    ED Course/ Medical Decision Making/ A&P  Patient presenting here with complaints of shortness of breath.  He has dyspnea on exertion and orthopnea worsening since earlier this evening.  His normal dialysis routine was interrupted due to the Thanksgiving holiday.  Patient arrives here significantly hypertensive with systolic blood pressure in excess of 200.  Oxygen saturations are mid 90s and he is in no respiratory distress.  He does have crackles in the bases bilaterally and trace edema of the lower extremities.  Workup initiated including CBC, metabolic panel, both are consistent with baseline.  EKG shows sinus rhythm with no acute changes from prior studies.  Chest x-ray shows cardiomegaly and changes of CHF without pulmonary edema.  Patient has received labetalol for his hypertension, but blood pressure has not improved significantly.  Given his hypertension, findings of CHF on chest x-ray, and signs of volume overload on physical exam, I feel as though he will require dialysis on an urgent basis.  He is due to be dialyzed at 6 AM at [facility] in [place], however I am somewhat uncomfortable sending him there given the clinical picture.  I have discussed care with Dr. Joelyn Oms from nephrology.  He will make arrangements for the patient to be dialyzed here at [facility] in the morning.  CRITICAL CARE Performed by: Veryl Speak Total critical care time: 45 minutes Critical care time was exclusive of separately billable procedures and treating other patients. Critical care was necessary to treat or prevent imminent or life-threatening deterioration. Critical care was time spent personally by me on the following activities:  development of treatment plan with patient and/or surrogate as well as nursing, discussions with consultants, evaluation of patient's response to treatment, examination of patient, obtaining history from patient or surrogate, ordering and  performing treatments and interventions, ordering and review of laboratory studies, ordering and review of radiographic studies, pulse oximetry and re-evaluation of patient's condition.   Final Clinical Impression(s) / ED Diagnoses Final diagnoses:  None    Rx / DC Orders ED Discharge Orders     None         Veryl Speak, MD 09/20/22 [phone]

## 2022-09-20 NOTE — ED Notes (Signed)
PT transported to Dialysis.

## 2022-09-20 NOTE — ED Notes (Signed)
Pt back to room from dialysis.

## 2022-09-20 NOTE — ED Triage Notes (Signed)
Pt c/o sob since last night, he states he did not feel well yesterday. His last dialysis was Wednesday.

## 2022-09-20 NOTE — Procedures (Signed)
   HEMODIALYSIS TREATMENT NOTE:   4 hour heparin-free treatment completed using left upper arm AVF (15g/antegrade). Goal met: 4 liters removed without interruption in UF.  Persistent hypertension throughout session with DBPs>100.  Labetalol 20 mg was ordered; given during the last 45 minutes of treatment with no effect - reported to Dr. Doren Custard.  All blood was returned and hemostasis was achieved in 30 minutes.  Pt was transported back to ED to await disposition.  Hand-off given to Lelon Frohlich, Therapist, sports.   Rockwell Alexandria, RN

## 2022-09-20 NOTE — ED Provider Notes (Signed)
Care of patient assumed from Dr. Stark Jock.  This patient with ESRD, last HD session on Wednesday, presents for shortness of breath.  Hypertension has been treated with labetalol.  Nephrology has been consulted.  They will arrange for dialysis from the ED.  He will require assessment upon return for likely discharge. Physical Exam  BP (!) 197/123   Pulse 97   Temp 97.9 F (36.6 C) (Oral)   Resp 13   Ht 6\' 1"  (1.854 m)   Wt (!) 138.3 kg   SpO2 99%   BMI 40.23 kg/m   Physical Exam Vitals and nursing note reviewed.  Constitutional:      General: He is not in acute distress.    Appearance: He is well-developed. He is not ill-appearing, toxic-appearing or diaphoretic.  HENT:     Head: Normocephalic and atraumatic.     Mouth/Throat:     Mouth: Mucous membranes are moist.  Eyes:     Extraocular Movements: Extraocular movements intact.     Conjunctiva/sclera: Conjunctivae normal.  Cardiovascular:     Rate and Rhythm: Normal rate and regular rhythm.     Heart sounds: No murmur heard. Pulmonary:     Effort: Pulmonary effort is normal. No tachypnea or respiratory distress.     Breath sounds: Normal breath sounds.  Abdominal:     Palpations: Abdomen is soft.  Musculoskeletal:        General: No swelling.     Cervical back: Normal range of motion and neck supple.  Neurological:     General: No focal deficit present.     Mental Status: He is alert and oriented to person, place, and time.  Psychiatric:        Mood and Affect: Mood normal.        Behavior: Behavior normal.     Procedures  Procedures  ED Course / MDM    Medical Decision Making Amount and/or Complexity of Data Reviewed Labs: ordered. Radiology: ordered.  Risk OTC drugs. Prescription drug management.   On assessment, patient resting comfortably, talking on cell phone.  Blood pressure has improved to the range of 175/100s.  He is agreeable for HD from the ED.

## 2022-09-20 NOTE — Progress Notes (Signed)
Brief [facility] Progress note Called by Dr. Stark Jock that this pt ESRD THS (last Tx 11/22) presented to ED with progressive SOB, HTN, and dyspnea.  K < 5.  On 2L [place].  Plan for HD today then reassessment by ED for likely DC thereafter.

## 2022-09-20 NOTE — Discharge Instructions (Signed)
Ensure that you take your scheduled blood pressure medications when you get home.  Continue dialysis as scheduled.  Return to the emergency department at any time for any new or worsening symptoms of concern.

## 2022-09-20 NOTE — Consult Note (Signed)
Kelly Key Admit Date: 09/20/2022 09/20/2022 Kelly Key Requesting Physician:  Kelly Jock MD  Reason for Consult:  ESRD, Acute dyspnea, HTN emergency.  HPI:  1M ESRD THS [facility] using LUE AVF presented to ED overnight with progressive  dyspnea, with sig inc BPs.  BPs SBP > 200, DBP > 120 rec IV labetalol.  Req 2L [place].  No F/C, productive cough.  He says his breathing feels like too much fliud.  Last HD 11/22.  K is 4.7, HCO3 25, Hb 10.7.  pCXR with come central vascular congestion.   PMH as below  Balance of 12 systems is negative w/ exceptions as above  PMH  Past Medical History:  Diagnosis Date   Acute diastolic CHF (congestive heart failure) ([place]) 06/11/2012   EF 50-55% [facility])   Chronic kidney disease    COPD (chronic obstructive pulmonary disease) ([facility])    Diabetes mellitus    A1c 11.5 (06/11/2012).   Dyspnea    Gout    Hepatic steatosis 06/11/2012   Elevated LFTs   Hyperlipemia    Malignant hypertension    Microcytic anemia 06/12/2012   Obesity    PSH  Past Surgical History:  Procedure Laterality Date   AV FISTULA PLACEMENT Left 11/11/2019   Procedure: ARTERIOVENOUS (AV) FISTULA CREATION LEFT BRACHIOCEPHALIC ARM;  Surgeon: Kelly Mitchell, MD;  Location: [facility];  Service: Vascular;  Laterality: Left;   COLONOSCOPY WITH PROPOFOL N/A 09/02/2021   Procedure: COLONOSCOPY WITH PROPOFOL;  Surgeon: Kelly Bellows, MD;  Location[facility] ENDOSCOPY;  Service: Gastroenterology;  Laterality: N/A;   IR FLUORO GUIDE CV LINE RIGHT  07/08/2019   IR REMOVAL TUN CV CATH W/O FL  03/23/2020   IR US GUIDE VASC ACCESS RIGHT  07/08/2019   LEFT HEART CATH AND CORONARY ANGIOGRAPHY N/A 06/12/2021   Procedure: LEFT HEART CATH AND CORONARY ANGIOGRAPHY;  Surgeon: Kelly Crome, MD;  Location: [facility] CV LAB;  Service: Cardiovascular;  Laterality: N/A;   NO PAST SURGERIES     VASCULAR SURGERY     FH  Family History  Problem Relation Age of Onset   Gout Mother    Asthma Mother     Diabetes Father    Heart failure Father    Diabetes Sister    Hypertension Brother    Pancreatic cancer Brother    Diabetes Sister    SH  reports that he has never smoked. He has been exposed to tobacco smoke. He has never used smokeless tobacco. He reports that he does not currently use alcohol. He reports that he does not use drugs. Allergies  Allergies  Allergen Reactions   Hydralazine Anxiety    Hallucinations    Lisinopril Swelling   Home medications Prior to Admission medications   Medication Sig Start Date End Date Taking? Authorizing Provider  acetaminophen (TYLENOL) 500 MG tablet Take 1,000 mg by mouth every 6 (six) hours as needed for moderate pain or headache.   Yes [provider]  albuterol (VENTOLIN HFA) 108 (90 Base) MCG/ACT inhaler Inhale 2 puffs into the lungs every 4 (four) hours as needed for wheezing or shortness of breath.  03/24/19  Yes [provider]  allopurinol (ZYLOPRIM) 100 MG tablet Take 2 tablets (200 mg total) by mouth daily. 08/04/19  Yes Key, Kelly Anchors, PA-C  amLODipine (NORVASC) 5 MG tablet Take 5 mg by mouth daily. 01/30/22  Yes [provider]  ammonium lactate (AMLACTIN) 12 % cream Apply 1 Application topically in the morning  and at bedtime. 09/08/22  Yes [provider]  aspirin EC 81 MG EC tablet Take 1 tablet (81 mg total) by mouth daily. 08/04/19  Yes Key, Kelly Anchors, PA-C  atorvastatin (LIPITOR) 80 MG tablet Take 1 tablet (80 mg total) by mouth daily. 12/05/19  Yes Key, Kelly Guild, MD  Calcium Carbonate Antacid (CALCIUM CARBONATE, DOSED IN MG ELEMENTAL CALCIUM,) 1250 MG/5ML SUSP Take 5 mLs (500 mg of elemental calcium total) by mouth every 6 (six) hours as needed for indigestion. 07/16/19  Yes Key, Kelly Flattery, MD  carvedilol (COREG) 25 MG tablet Take 1 tablet (25 mg total) by mouth 2 (two) times daily with a meal. 08/03/19  Yes Key, Kelly Anchors, PA-C  cloNIDine (CATAPRES) 0.3 MG tablet Take 0.3 mg by mouth 3 (three)  times daily.   Yes [provider]  esomeprazole (NEXIUM) 20 MG capsule Take 20 mg by mouth daily. 01/23/22  Yes [provider]  ezetimibe (ZETIA) 10 MG tablet Take 10 mg by mouth every evening. 01/20/19  Yes [provider]  fluticasone (FLONASE) 50 MCG/ACT nasal spray Place 1 spray into both nostrils daily as needed for allergies or rhinitis.   Yes [provider]  furosemide (LASIX) 80 MG tablet Take 1 tablet (80 mg total) by mouth daily. 05/16/21  Yes Key, Kelly A, MD  gabapentin (NEURONTIN) 100 MG capsule Take 1 capsule (100 mg total) by mouth 3 (three) times daily. 08/03/19  Yes Key, Kelly Anchors, PA-C  HUMALOG KWIKPEN 100 UNIT/ML KwikPen Inject 8-14 Units into the skin 3 (three) times daily. 08/19/22  Yes [provider]  hydrOXYzine (ATARAX) 25 MG tablet Take 25 mg by mouth 3 (three) times daily as needed for anxiety. 11/28/21  Yes [provider]  LEVEMIR FLEXTOUCH 100 UNIT/ML FlexTouch Pen INJECT 50 UNITS SUBCUTANEOUSLY AT BEDTIME Patient taking differently: Inject 50 Units into the skin at bedtime. 07/09/21  Yes Kelly Romp, NP  linaclotide [facility]) 145 MCG CAPS capsule Take 1 capsule (145 mcg total) by mouth daily before breakfast. 04/05/18  Yes Manuella Key, Kelly D, DO  multivitamin (RENA-VIT) TABS tablet Take 1 tablet by mouth at bedtime. 08/03/19  Yes Key, Kelly Anchors, PA-C  mupirocin ointment (BACTROBAN) 2 % Apply 1 Application topically 2 (two) times daily.   Yes [provider]  OZEMPIC, 1 MG/DOSE, 4 MG/3ML SOPN Inject 1 mg into the skin once a week. Fridays.   Yes [provider]  sevelamer carbonate (RENVELA) 800 MG tablet Take 3 tablets (2,400 mg total) by mouth 3 (three) times daily with meals. 08/03/19  Yes Key, Kelly Anchors, PA-C  vardenafil (LEVITRA) 20 MG tablet Take 1 tablet (20 mg total) by mouth daily as needed for erectile dysfunction. 08/16/21  Yes Key, Kelly Leader., MD  AMBULATORY NON FORMULARY  MEDICATION Inject 0.2 ml by intracavernosal route as directed.  Medication Name: TriMix PGE 10 mcg Pap   30 mg Phent 1 mg 01/01/22   Key, Kelly Leader., MD  glucose blood (ACCU-CHEK AVIVA PLUS) test strip Use as instructed TO CHECK BLOOD GLUCOSE THREE TIMES DAILY 08/15/20   Nida, Marella Chimes, MD  Insulin Pen Needle (B-D ULTRAFINE III SHORT PEN) 31G X 8 MM MISC 1 each by Does not apply route as directed. 08/13/20   Cassandria Anger, MD    Current Medications Scheduled Meds:  bebtelovimab EUA  175 mg Intravenous Once   Continuous Infusions:  sodium chloride     PRN Meds:.sodium chloride  CBC Recent Labs  Lab  09/20/22 0318  WBC 10.4  NEUTROABS 7.4  HGB 10.7*  HCT 33.4*  MCV 88.8  PLT 218   Basic Metabolic Panel Recent Labs  Lab 09/20/22 0318  NA 135  K 4.7  CL 97*  CO2 25  GLUCOSE 164*  BUN 63*  CREATININE 10.69*  CALCIUM 9.5    Physical Exam  Blood pressure (!) 175/104, pulse 84, temperature 98 F (36.7 C), temperature source Oral, resp. rate 14, height 6\' 1"  (1.854 m), weight (!) 138.3 kg, SpO2 100 %. GEN: Laying 30dec HOB elevated, speaks mostly in full sentences ENT: NCAT EYES: EOMI CV: RRR ,no rub PULM: Diminished in bases b/l, ABD: s/nt/nd SKIN: no rashes/lesions EXT:1+ LEE LUE AVF +B/T  Assessment 30M ESRD THS [facility] with HTN emergency / acute dyspnea.   ESRD THS LUE AVF [facility] HTN Emergency, likely vol overload after 2d gap and holiday food Acute dyspnea 2/2 #2 Anemia, Hb stable  Plan HD now, 4L UF BP permitting, use AVF. 2K bath.  To return to ED post HD for reassessment and possible discharge.   Kelly Key  09/20/2022, 11:06 AM

## 2022-09-22 LAB — HEPATITIS B SURFACE ANTIBODY, QUANTITATIVE: Hep B S AB Quant (Post): 36.4 m[IU]/mL (ref >9.9)

## 2022-09-24 ENCOUNTER — Encounter: Payer: Self-pay | Admitting: Urology

## 2022-09-24 ENCOUNTER — Ambulatory Visit (INDEPENDENT_AMBULATORY_CARE_PROVIDER_SITE_OTHER): Payer: Medicare Other | Admitting: Urology

## 2022-09-24 ENCOUNTER — Ambulatory Visit: Payer: Medicare Other | Admitting: Urology

## 2022-09-24 VITALS — BP 152/87 | HR 88 | Ht 73.0 in | Wt 293.0 lb

## 2022-09-24 DIAGNOSIS — N529 Male erectile dysfunction, unspecified: Secondary | ICD-10-CM

## 2022-09-24 NOTE — Progress Notes (Signed)
Assessment: 1. Organic impotence     Plan: I recommended that he continue to try the Trimix 10/30/1 with a dose of 0.2 mL/inj. I recommended that he get a refill of the medication as his current medication is likely expired. I advised him to contact the office with the results of the injection and to discuss increasing the dose if needed.  Chief Complaint:  Chief Complaint  Patient presents with   Erectile Dysfunction   History of Present Illness:  Kelly Key is a 51 y.o. year old male who is seen for further evaluation of erectile dysfunction.  At his visit in 10/22, he reported symptoms had been present for approximately 1 year with gradual worsening.  He was able to achieve a partial erection with 50% rigidity but had rapid detumescence.  He was unable to use his erections for intercourse.  No nocturnal or early morning erections.  No decrease in his libido.  No pain or curvature with erections.  He had previously tried Viagra 100 mg without benefit.  He had also tried some over-the-counter medications without benefit. He is on hemodialysis for end-stage renal disease and voids approximately twice a day.  No significant voiding symptoms. He was given a trial of Levitra 20 mg as needed at his visit in 10/22.  He did not see any improvement in his erectile dysfunction with the Levitra.  He continued to have difficulty achieving and maintaining his erections.  No decrease in his libido.   He was started on penile injections with Trimix 10/30/1 0.2 mL/inj. at his visit in March 2023.  At the time of his last visit in May 2023, he had used the Trimix at a dosage of 0.2 mL at home on 2 occasions.  He noted a good result with a satisfactory erection.  He did not have any difficulty with the erection.  No prolonged erection.    He returns today for follow-up.  He has not used the Trimix since his last visit as he does not have a partner.  He reports that the injection worked well the  first time but he had decreased results the second time.  Portions of the above documentation were copied from a prior visit for review purposes only.  Past Medical History:  Past Medical History:  Diagnosis Date   Acute diastolic CHF (congestive heart failure) ([place]) 06/11/2012   EF 50-55% [facility])   Chronic kidney disease    COPD (chronic obstructive pulmonary disease) ([facility])    Diabetes mellitus    A1c 11.5 (06/11/2012).   Dyspnea    Gout    Hepatic steatosis 06/11/2012   Elevated LFTs   Hyperlipemia    Malignant hypertension    Microcytic anemia 06/12/2012   Obesity     Past Surgical History:  Past Surgical History:  Procedure Laterality Date   AV FISTULA PLACEMENT Left 11/11/2019   Procedure: ARTERIOVENOUS (AV) FISTULA CREATION LEFT BRACHIOCEPHALIC ARM;  Surgeon: Serafina Mitchell, MD;  Location: [place];  Service: Vascular;  Laterality: Left;   COLONOSCOPY WITH PROPOFOL N/A 09/02/2021   Procedure: COLONOSCOPY WITH PROPOFOL;  Surgeon: Jonathon Bellows, MD;  Location: [facility] ENDOSCOPY;  Service: Gastroenterology;  Laterality: N/A;   IR FLUORO GUIDE CV LINE RIGHT  07/08/2019   IR REMOVAL TUN CV CATH W/O FL  03/23/2020   IR US GUIDE VASC ACCESS RIGHT  07/08/2019   LEFT HEART CATH AND CORONARY ANGIOGRAPHY N/A 06/12/2021   Procedure: LEFT HEART CATH AND CORONARY ANGIOGRAPHY;  Surgeon: Belva Crome, MD;  Location: [place] CV LAB;  Service: Cardiovascular;  Laterality: N/A;   NO PAST SURGERIES     VASCULAR SURGERY      Allergies:  Allergies  Allergen Reactions   Hydralazine Anxiety    Hallucinations    Lisinopril Swelling    Family History:  Family History  Problem Relation Age of Onset   Gout Mother    Asthma Mother    Diabetes Father    Heart failure Father    Diabetes Sister    Hypertension Brother    Pancreatic cancer Brother    Diabetes Sister     Social History:  Social History   Tobacco Use   Smoking status: Never    Passive exposure: Past   Smokeless  tobacco: Never  Vaping Use   Vaping Use: Never used  Substance Use Topics   Alcohol use: Not Currently    Comment: haven't drank in over 5 years    Drug use: No    ROS: Constitutional:  Negative for fever, chills, weight loss CV: Negative for chest pain, previous MI, hypertension Respiratory:  Negative for shortness of breath, wheezing, sleep apnea, frequent cough GI:  Negative for nausea, vomiting, bloody stool, GERD  Physical exam: BP (!) 152/87   Pulse 88   Ht 6\' 1"  (1.854 m)   Wt 293 lb (132.9 kg)   BMI 38.66 kg/m  GENERAL APPEARANCE:  Well appearing, well developed, well nourished, NAD HEENT:  Atraumatic, normocephalic, oropharynx clear NECK:  Supple without lymphadenopathy or thyromegaly ABDOMEN:  Soft, non-tender, no masses EXTREMITIES:  Moves all extremities well, without clubbing, cyanosis, or edema NEUROLOGIC:  Alert and oriented x 3, normal gait, CN II-XII grossly intact MENTAL STATUS:  appropriate BACK:  Non-tender to palpation, No CVAT SKIN:  Warm, dry, and intact   Results: No specimen obtained

## 2022-09-26 ENCOUNTER — Other Ambulatory Visit (HOSPITAL_COMMUNITY): Payer: Self-pay | Admitting: Podiatry

## 2022-09-26 DIAGNOSIS — I749 Embolism and thrombosis of unspecified artery: Secondary | ICD-10-CM

## 2022-10-01 ENCOUNTER — Ambulatory Visit (HOSPITAL_COMMUNITY)
Admission: RE | Admit: 2022-10-01 | Discharge: 2022-10-01 | Disposition: A | Discharge status: Home / Self Care | Payer: Medicare Other | Source: Ambulatory Visit | Attending: Podiatry | Admitting: Podiatry

## 2022-10-01 DIAGNOSIS — I749 Embolism and thrombosis of unspecified artery: Secondary | ICD-10-CM | POA: Diagnosis not present

## 2022-10-17 ENCOUNTER — Other Ambulatory Visit: Payer: Self-pay

## 2022-10-17 ENCOUNTER — Observation Stay (HOSPITAL_COMMUNITY)
Admission: EM | Admit: 2022-10-17 | Discharge: 2022-10-18 | Disposition: A | Discharge status: Home / Self Care | Payer: Medicare Other | Attending: Internal Medicine | Admitting: Internal Medicine

## 2022-10-17 DIAGNOSIS — E1122 Type 2 diabetes mellitus with diabetic chronic kidney disease: Secondary | ICD-10-CM | POA: Insufficient documentation

## 2022-10-17 DIAGNOSIS — R1310 Dysphagia, unspecified: Principal | ICD-10-CM

## 2022-10-17 DIAGNOSIS — T7840XA Allergy, unspecified, initial encounter: Secondary | ICD-10-CM

## 2022-10-17 DIAGNOSIS — I132 Hypertensive heart and chronic kidney disease with heart failure and with stage 5 chronic kidney disease, or end stage renal disease: Secondary | ICD-10-CM | POA: Diagnosis not present

## 2022-10-17 DIAGNOSIS — R112 Nausea with vomiting, unspecified: Secondary | ICD-10-CM | POA: Diagnosis not present

## 2022-10-17 DIAGNOSIS — Z7722 Contact with and (suspected) exposure to environmental tobacco smoke (acute) (chronic): Secondary | ICD-10-CM | POA: Insufficient documentation

## 2022-10-17 DIAGNOSIS — J449 Chronic obstructive pulmonary disease, unspecified: Secondary | ICD-10-CM | POA: Diagnosis not present

## 2022-10-17 DIAGNOSIS — J384 Edema of larynx: Secondary | ICD-10-CM

## 2022-10-17 DIAGNOSIS — Z7982 Long term (current) use of aspirin: Secondary | ICD-10-CM | POA: Diagnosis not present

## 2022-10-17 DIAGNOSIS — J029 Acute pharyngitis, unspecified: Secondary | ICD-10-CM | POA: Diagnosis not present

## 2022-10-17 DIAGNOSIS — Z794 Long term (current) use of insulin: Secondary | ICD-10-CM | POA: Diagnosis not present

## 2022-10-17 DIAGNOSIS — I10 Essential (primary) hypertension: Secondary | ICD-10-CM | POA: Diagnosis present

## 2022-10-17 DIAGNOSIS — I5031 Acute diastolic (congestive) heart failure: Secondary | ICD-10-CM | POA: Insufficient documentation

## 2022-10-17 DIAGNOSIS — Z992 Dependence on renal dialysis: Secondary | ICD-10-CM | POA: Diagnosis not present

## 2022-10-17 DIAGNOSIS — E8770 Fluid overload, unspecified: Secondary | ICD-10-CM | POA: Diagnosis present

## 2022-10-17 DIAGNOSIS — N186 End stage renal disease: Secondary | ICD-10-CM | POA: Diagnosis present

## 2022-10-17 DIAGNOSIS — Z1152 Encounter for screening for COVID-19: Secondary | ICD-10-CM | POA: Insufficient documentation

## 2022-10-17 DIAGNOSIS — E119 Type 2 diabetes mellitus without complications: Secondary | ICD-10-CM

## 2022-10-17 DIAGNOSIS — Z79899 Other long term (current) drug therapy: Secondary | ICD-10-CM | POA: Insufficient documentation

## 2022-10-17 LAB — CBC WITH DIFFERENTIAL/PLATELET
Abs Immature Granulocytes: 0.01 10*3/uL (ref 0.00–0.07)
Basophils Absolute: 0.1 10*3/uL (ref 0.0–0.1)
Basophils Relative: 1 %
Eosinophils Absolute: 0.3 10*3/uL (ref 0.0–0.5)
Eosinophils Relative: 4 %
HCT: 35.5 % — ABNORMAL LOW (ref 39.0–52.0)
Hemoglobin: 11.6 g/dL — ABNORMAL LOW (ref 13.0–17.0)
Immature Granulocytes: 0 %
Lymphocytes Relative: 30 %
Lymphs Abs: 2.1 10*3/uL (ref 0.7–4.0)
MCH: 28.8 pg (ref 26.0–34.0)
MCHC: 32.7 g/dL (ref 30.0–36.0)
MCV: 88.1 fL (ref 80.0–100.0)
Monocytes Absolute: 0.7 10*3/uL (ref 0.1–1.0)
Monocytes Relative: 10 %
Neutro Abs: 3.9 10*3/uL (ref 1.7–7.7)
Neutrophils Relative %: 55 %
Platelets: 251 10*3/uL (ref 150–400)
RBC: 4.03 MIL/uL — ABNORMAL LOW (ref 4.22–5.81)
RDW: 14.6 % (ref 11.5–15.5)
WBC: 7 10*3/uL (ref 4.0–10.5)
nRBC: 0 % (ref 0.0–0.2)

## 2022-10-17 LAB — RESP PANEL BY RT-PCR (RSV, FLU A&B, COVID)  RVPGX2
Influenza A by PCR: NEGATIVE
Influenza B by PCR: NEGATIVE
Resp Syncytial Virus by PCR: NEGATIVE
SARS Coronavirus 2 by RT PCR: NEGATIVE

## 2022-10-17 LAB — CBG MONITORING, ED: Glucose-Capillary: 128 mg/dL — ABNORMAL HIGH (ref 70–99)

## 2022-10-17 LAB — COMPREHENSIVE METABOLIC PANEL
ALT: 26 U/L (ref 0–44)
AST: 28 U/L (ref 15–41)
Albumin: 4.2 g/dL (ref 3.5–5.0)
Alkaline Phosphatase: 69 U/L (ref 38–126)
Anion gap: 16 — ABNORMAL HIGH (ref 5–15)
BUN: 58 mg/dL — ABNORMAL HIGH (ref 6–20)
CO2: 30 mmol/L (ref 22–32)
Calcium: 9.5 mg/dL (ref 8.9–10.3)
Chloride: 93 mmol/L — ABNORMAL LOW (ref 98–111)
Creatinine, Ser: 10.07 mg/dL — ABNORMAL HIGH (ref 0.61–1.24)
GFR, Estimated: 6 mL/min — ABNORMAL LOW (ref >60)
Glucose, Bld: 144 mg/dL — ABNORMAL HIGH (ref 70–99)
Potassium: 4.5 mmol/L (ref 3.5–5.1)
Sodium: 139 mmol/L (ref 135–145)
Total Bilirubin: 0.6 mg/dL (ref 0.3–1.2)
Total Protein: 8.6 g/dL — ABNORMAL HIGH (ref 6.5–8.1)

## 2022-10-17 LAB — GROUP A STREP BY PCR: Group A Strep by PCR: NOT DETECTED

## 2022-10-17 MED ORDER — LIDOCAINE VISCOUS HCL 2 % MT SOLN
15.0000 mL | Freq: Once | OROMUCOSAL | Status: AC
  Administered 2022-10-17: 15 mL via OROMUCOSAL
  Filled 2022-10-17: qty 15

## 2022-10-17 MED ORDER — METHYLPREDNISOLONE SODIUM SUCC 125 MG IJ SOLR: 125.0000 mg | Freq: Once | INTRAMUSCULAR | Status: DC

## 2022-10-17 MED ORDER — ONDANSETRON HCL 4 MG/2ML IJ SOLN
4.0000 mg | Freq: Once | INTRAMUSCULAR | Status: AC
  Administered 2022-10-17: 4 mg via INTRAVENOUS
  Filled 2022-10-17: qty 2

## 2022-10-17 MED ORDER — ALBUTEROL SULFATE HFA 108 (90 BASE) MCG/ACT IN AERS: 2.0000 | INHALATION_SPRAY | Freq: Four times a day (QID) | RESPIRATORY_TRACT | Status: DC | PRN

## 2022-10-17 MED ORDER — DIPHENHYDRAMINE HCL 50 MG/ML IJ SOLN
25.0000 mg | Freq: Once | INTRAMUSCULAR | Status: AC
  Administered 2022-10-17: 25 mg via INTRAVENOUS
  Filled 2022-10-17: qty 1

## 2022-10-17 MED ORDER — FAMOTIDINE IN NACL 20-0.9 MG/50ML-% IV SOLN
20.0000 mg | Freq: Once | INTRAVENOUS | Status: AC
  Administered 2022-10-17: 20 mg via INTRAVENOUS
  Filled 2022-10-17: qty 50

## 2022-10-17 MED ORDER — METHYLPREDNISOLONE SODIUM SUCC 40 MG IJ SOLR
60.0000 mg | Freq: Once | INTRAMUSCULAR | Status: AC
  Administered 2022-10-17: 60 mg via INTRAVENOUS
  Filled 2022-10-17: qty 2

## 2022-10-17 NOTE — ED Triage Notes (Signed)
Patient started taking ozempic. Patient has been vomiting and complain of difficulty swallowing and vomiting. States "I can't swallow".

## 2022-10-17 NOTE — ED Provider Notes (Signed)
11:50 PM Assumed care from Dr. Tomi Bamberger and Fatima Sanger, please see their note for full history, physical and decision making until this point. In brief this is a 51 y.o. year old male who presented to the ED tonight with Allergic Reaction     Feels like he can't swallow. No stridor. No resp issues. Pending ct for evaluation and reeval for dispo.  On my exam has hoarse voice but no hypoxia, stridor, tachypnea or other concern for acute airway compromise.   Ct with e/o airway narrowing and possible epiglottis. D/w ENT who felt it could be related to ESRD but didn't disagree with close monitoring and steroids. Didn't feel like the patient needed transfer to [facility] without airway concerns. Admitted to [facility] for monitoring.   Labs, studies and imaging reviewed by myself and considered in medical decision making if ordered. Imaging interpreted by radiology.  Labs Reviewed  COMPREHENSIVE METABOLIC PANEL - Abnormal; Notable for the following components:      Result Value   Chloride 93 (*)    Glucose, Bld 144 (*)    BUN 58 (*)    Creatinine, Ser 10.07 (*)    Total Protein 8.6 (*)    GFR, Estimated 6 (*)    Anion gap 16 (*)    All other components within normal limits  CBC WITH DIFFERENTIAL/PLATELET - Abnormal; Notable for the following components:   RBC 4.03 (*)    Hemoglobin 11.6 (*)    HCT 35.5 (*)    All other components within normal limits  CBG MONITORING, ED - Abnormal; Notable for the following components:   Glucose-Capillary 128 (*)    All other components within normal limits  RESP PANEL BY RT-PCR (RSV, FLU A&B, COVID)  RVPGX2  GROUP A STREP BY PCR    CT Soft Tissue Neck W Contrast    (Results Pending)    No follow-ups on file.    Merrily Pew, MD 10/19/22 [phone]

## 2022-10-17 NOTE — ED Provider Notes (Signed)
[facility] EMERGENCY DEPARTMENT Provider Note   CSN: [id number] Arrival date & time: 10/17/22  2056     History  Chief Complaint  Patient presents with   Allergic Reaction    Kelly Key is a 51 y.o. male, history of diabetes, ESRD, who presents to the ED secondary to concerns for having allergic reaction to his Ozempic.  He states that his primary care doctor increased his dose, and he has been vomiting, and feels like his throat is closing up.  He denies any shortness of breath, chest pain, just feels like he is sick to his stomach.  Goes to dialysis Tuesday, Thursday, Saturday.  Denies any rash.  Has been on Ozempic for the last 2 years.     Home Medications Prior to Admission medications   Medication Sig Start Date End Date Taking? Authorizing Provider  acetaminophen (TYLENOL) 500 MG tablet Take 1,000 mg by mouth every 6 (six) hours as needed for moderate pain or headache.    [provider]  albuterol (VENTOLIN HFA) 108 (90 Base) MCG/ACT inhaler Inhale 2 puffs into the lungs every 4 (four) hours as needed for wheezing or shortness of breath.  03/24/19   [provider]  allopurinol (ZYLOPRIM) 100 MG tablet Take 2 tablets (200 mg total) by mouth daily. 08/04/19   Love, Ivan Anchors, PA-C  AMBULATORY NON FORMULARY MEDICATION Inject 0.2 ml by intracavernosal route as directed.  Medication Name: TriMix PGE 10 mcg Pap   30 mg Phent 1 mg 01/01/22   Stoneking, Reece Leader., MD  amLODipine (NORVASC) 5 MG tablet Take 5 mg by mouth daily. 01/30/22   [provider]  ammonium lactate (AMLACTIN) 12 % cream Apply 1 Application topically in the morning and at bedtime. 09/08/22   [provider]  aspirin EC 81 MG EC tablet Take 1 tablet (81 mg total) by mouth daily. 08/04/19   Love, Ivan Anchors, PA-C  atorvastatin (LIPITOR) 80 MG tablet Take 1 tablet (80 mg total) by mouth daily. 12/05/19   Arnoldo Lenis, MD  Calcium Carbonate Antacid (CALCIUM CARBONATE, DOSED IN  MG ELEMENTAL CALCIUM,) 1250 MG/5ML SUSP Take 5 mLs (500 mg of elemental calcium total) by mouth every 6 (six) hours as needed for indigestion. 07/16/19   Amin, Jeanella Flattery, MD  carvedilol (COREG) 25 MG tablet Take 1 tablet (25 mg total) by mouth 2 (two) times daily with a meal. 08/03/19   Love, Ivan Anchors, PA-C  cloNIDine (CATAPRES) 0.3 MG tablet Take 0.3 mg by mouth 3 (three) times daily.    [provider]  esomeprazole (NEXIUM) 20 MG capsule Take 20 mg by mouth daily. 01/23/22   [provider]  ezetimibe (ZETIA) 10 MG tablet Take 10 mg by mouth every evening. 01/20/19   [provider]  fluticasone (FLONASE) 50 MCG/ACT nasal spray Place 1 spray into both nostrils daily as needed for allergies or rhinitis.    [provider]  furosemide (LASIX) 80 MG tablet Take 1 tablet (80 mg total) by mouth daily. 05/16/21   Shahmehdi, Valeria Batman, MD  gabapentin (NEURONTIN) 100 MG capsule Take 1 capsule (100 mg total) by mouth 3 (three) times daily. 08/03/19   Love, Ivan Anchors, PA-C  glucose blood (ACCU-CHEK AVIVA PLUS) test strip Use as instructed TO CHECK BLOOD GLUCOSE THREE TIMES DAILY 08/15/20   Nida, Marella Chimes, MD  HUMALOG KWIKPEN 100 UNIT/ML KwikPen Inject 8-14 Units into the skin 3 (three) times daily. 08/19/22   [provider]  hydrOXYzine (ATARAX) 25 MG tablet Take 25 mg by mouth 3 (three) times daily as needed for anxiety. 11/28/21   [provider]  Insulin Pen Needle (B-D ULTRAFINE III SHORT PEN) 31G X 8 MM MISC 1 each by Does not apply route as directed. 08/13/20   Cassandria Anger, MD  LEVEMIR FLEXTOUCH 100 UNIT/ML FlexTouch Pen INJECT 50 UNITS SUBCUTANEOUSLY AT BEDTIME Patient taking differently: Inject 50 Units into the skin at bedtime. 07/09/21   Brita Romp, NP  linaclotide Rolan Lipa) 145 MCG CAPS capsule Take 1 capsule (145 mcg total) by mouth daily before breakfast. 04/05/18   Manuella Ghazi, Pratik D, DO  multivitamin (RENA-VIT) TABS tablet Take  1 tablet by mouth at bedtime. 08/03/19   Love, Ivan Anchors, PA-C  mupirocin ointment (BACTROBAN) 2 % Apply 1 Application topically 2 (two) times daily.    [provider]  OZEMPIC, 1 MG/DOSE, 4 MG/3ML SOPN Inject 1 mg into the skin once a week. Fridays.    [provider]  sevelamer carbonate (RENVELA) 800 MG tablet Take 3 tablets (2,400 mg total) by mouth 3 (three) times daily with meals. 08/03/19   Love, Ivan Anchors, PA-C  vardenafil (LEVITRA) 20 MG tablet Take 1 tablet (20 mg total) by mouth daily as needed for erectile dysfunction. 08/16/21   Stoneking, Reece Leader., MD      Allergies    Hydralazine and Lisinopril    Review of Systems   Review of Systems  HENT:  Positive for sore throat.   Gastrointestinal:  Positive for nausea and vomiting.    Physical Exam Updated Vital Signs BP (!) 157/95   Pulse (!) 106   Resp 12   Ht 6\' 1"  (1.854 m)   Wt 86.2 kg   SpO2 100%   BMI 25.07 kg/m  Physical Exam Vitals and nursing note reviewed.  Constitutional:      General: He is not in acute distress.    Appearance: He is well-developed.  HENT:     Head: Normocephalic and atraumatic.     Mouth/Throat:     Mouth: Mucous membranes are moist.     Comments: Secretions around the uvula, uvula is midline, no overt pharyngeal swelling.  No muffled voice. Eyes:     Conjunctiva/sclera: Conjunctivae normal.  Cardiovascular:     Rate and Rhythm: Normal rate and regular rhythm.     Heart sounds: No murmur heard. Pulmonary:     Effort: Pulmonary effort is normal. No respiratory distress.     Breath sounds: Normal breath sounds.  Abdominal:     Palpations: Abdomen is soft.     Tenderness: There is no abdominal tenderness.  Musculoskeletal:        General: No swelling.     Cervical back: Neck supple.  Skin:    General: Skin is warm and dry.     Capillary Refill: Capillary refill takes less than 2 seconds.  Neurological:     Mental Status: He is alert.  Psychiatric:        Mood  and Affect: Mood normal.     ED Results / Procedures / Treatments   Labs (all labs ordered are listed, but only abnormal results are displayed) Labs Reviewed  COMPREHENSIVE METABOLIC PANEL - Abnormal; Notable for the following components:      Result Value   Chloride 93 (*)    Glucose, Bld 144 (*)    BUN 58 (*)    Creatinine, Ser 10.07 (*)    Total Protein 8.6 (*)  GFR, Estimated 6 (*)    Anion gap 16 (*)    All other components within normal limits  CBC WITH DIFFERENTIAL/PLATELET - Abnormal; Notable for the following components:   RBC 4.03 (*)    Hemoglobin 11.6 (*)    HCT 35.5 (*)    All other components within normal limits  CBG MONITORING, ED - Abnormal; Notable for the following components:   Glucose-Capillary 128 (*)    All other components within normal limits  RESP PANEL BY RT-PCR (RSV, FLU A&B, COVID)  RVPGX2  GROUP A STREP BY PCR    EKG EKG Interpretation  Date/Time:  Friday October 17 2022 21:16:39 EST Ventricular Rate:  136 PR Interval:    QRS Duration: 95 QT Interval:  385 QTC Calculation: 528 R Axis:   -60 Text Interpretation: poor data quality, unable to interpret, repeat Confirmed by Dorie Rank [phone]) on 10/17/2022 10:34:07 PM  Radiology No results found.  Procedures Procedures    Medications Ordered in ED Medications  ondansetron (ZOFRAN) injection 4 mg (has no administration in time range)  diphenhydrAMINE (BENADRYL) injection 25 mg (25 mg Intravenous Given 10/17/22 2151)  famotidine (PEPCID) IVPB 20 mg premix (0 mg Intravenous Stopped 10/17/22 2230)  methylPREDNISolone sodium succinate (SOLU-MEDROL) 40 mg/mL injection 60 mg (60 mg Intravenous Given 10/17/22 2150)  lidocaine (XYLOCAINE) 2 % viscous mouth solution 15 mL (15 mLs Mouth/Throat Given 10/17/22 2304)    ED Course/ Medical Decision Making/ A&P                           Medical Decision Making Patient here for nausea, vomiting, severe sore throat, unable to tolerate  secretions.  He states he is concerned he is having allergic reaction to his Ozempic.  We will obtain labs, and strep, COVID flu for further evaluation.  He has no abdominal tenderness, and there are no acute findings in his exam.  We will also give him a cocktail for possible allergic reaction and monitor for further evaluation.  Amount and/or Complexity of Data Reviewed Labs: ordered.    Details: Labs within normal limits. Radiology: ordered. Discussion of management or test interpretation with external provider(s): Patient insistent that he cannot swallow, and that even after the lidocaine he is unable to swallow.  We will obtain a CT nasopharynx for further evaluation, and handoff to Dr. Dayna Barker who will follow-up on the CT nasopharynx.  Risk Prescription drug management.    Final Clinical Impression(s) / ED Diagnoses Final diagnoses:  Allergic reaction, initial encounter  Dysphagia, unspecified type    Rx / DC Orders ED Discharge Orders     None         Rosemaria Inabinet, Si Gaul, PA 10/17/22 2334    Dorie Rank, MD 10/20/22 [phone]

## 2022-10-18 ENCOUNTER — Other Ambulatory Visit: Payer: Self-pay

## 2022-10-18 ENCOUNTER — Encounter (HOSPITAL_COMMUNITY): Payer: Self-pay | Admitting: Family Medicine

## 2022-10-18 ENCOUNTER — Emergency Department (HOSPITAL_COMMUNITY): Payer: Medicare Other

## 2022-10-18 DIAGNOSIS — I10 Essential (primary) hypertension: Secondary | ICD-10-CM | POA: Diagnosis not present

## 2022-10-18 DIAGNOSIS — R1310 Dysphagia, unspecified: Secondary | ICD-10-CM

## 2022-10-18 DIAGNOSIS — E119 Type 2 diabetes mellitus without complications: Secondary | ICD-10-CM | POA: Diagnosis not present

## 2022-10-18 DIAGNOSIS — N186 End stage renal disease: Secondary | ICD-10-CM

## 2022-10-18 DIAGNOSIS — E8770 Fluid overload, unspecified: Secondary | ICD-10-CM

## 2022-10-18 LAB — RAPID URINE DRUG SCREEN, HOSP PERFORMED
Amphetamines: NOT DETECTED
Barbiturates: NOT DETECTED
Benzodiazepines: NOT DETECTED
Cocaine: NOT DETECTED
Opiates: NOT DETECTED
Tetrahydrocannabinol: NOT DETECTED

## 2022-10-18 LAB — CBC WITH DIFFERENTIAL/PLATELET
Abs Immature Granulocytes: 0.03 10*3/uL (ref 0.00–0.07)
Basophils Absolute: 0 10*3/uL (ref 0.0–0.1)
Basophils Relative: 0 %
Eosinophils Absolute: 0 10*3/uL (ref 0.0–0.5)
Eosinophils Relative: 0 %
HCT: 36.2 % — ABNORMAL LOW (ref 39.0–52.0)
Hemoglobin: 11.6 g/dL — ABNORMAL LOW (ref 13.0–17.0)
Immature Granulocytes: 0 %
Lymphocytes Relative: 17 %
Lymphs Abs: 1.3 10*3/uL (ref 0.7–4.0)
MCH: 28.6 pg (ref 26.0–34.0)
MCHC: 32 g/dL (ref 30.0–36.0)
MCV: 89.4 fL (ref 80.0–100.0)
Monocytes Absolute: 0.1 10*3/uL (ref 0.1–1.0)
Monocytes Relative: 1 %
Neutro Abs: 6.3 10*3/uL (ref 1.7–7.7)
Neutrophils Relative %: 82 %
Platelets: 250 10*3/uL (ref 150–400)
RBC: 4.05 MIL/uL — ABNORMAL LOW (ref 4.22–5.81)
RDW: 14.5 % (ref 11.5–15.5)
WBC: 7.8 10*3/uL (ref 4.0–10.5)
nRBC: 0 % (ref 0.0–0.2)

## 2022-10-18 LAB — COMPREHENSIVE METABOLIC PANEL
ALT: 24 U/L (ref 0–44)
AST: 24 U/L (ref 15–41)
Albumin: 3.8 g/dL (ref 3.5–5.0)
Alkaline Phosphatase: 69 U/L (ref 38–126)
Anion gap: 14 (ref 5–15)
BUN: 64 mg/dL — ABNORMAL HIGH (ref 6–20)
CO2: 26 mmol/L (ref 22–32)
Calcium: 9.2 mg/dL (ref 8.9–10.3)
Chloride: 96 mmol/L — ABNORMAL LOW (ref 98–111)
Creatinine, Ser: 10.37 mg/dL — ABNORMAL HIGH (ref 0.61–1.24)
GFR, Estimated: 5 mL/min — ABNORMAL LOW (ref >60)
Glucose, Bld: 306 mg/dL — ABNORMAL HIGH (ref 70–99)
Potassium: 5.7 mmol/L — ABNORMAL HIGH (ref 3.5–5.1)
Sodium: 136 mmol/L (ref 135–145)
Total Bilirubin: 0.5 mg/dL (ref 0.3–1.2)
Total Protein: 8.1 g/dL (ref 6.5–8.1)

## 2022-10-18 LAB — HIV ANTIBODY (ROUTINE TESTING W REFLEX): HIV Screen 4th Generation wRfx: NONREACTIVE

## 2022-10-18 LAB — GLUCOSE, CAPILLARY: Glucose-Capillary: 303 mg/dL — ABNORMAL HIGH (ref 70–99)

## 2022-10-18 LAB — CBG MONITORING, ED
Glucose-Capillary: 282 mg/dL — ABNORMAL HIGH (ref 70–99)
Glucose-Capillary: 327 mg/dL — ABNORMAL HIGH (ref 70–99)

## 2022-10-18 LAB — PHOSPHORUS: Phosphorus: 4.9 mg/dL — ABNORMAL HIGH (ref 2.5–4.6)

## 2022-10-18 LAB — MAGNESIUM: Magnesium: 2.4 mg/dL (ref 1.7–2.4)

## 2022-10-18 MED ORDER — LIDOCAINE HCL (PF) 1 % IJ SOLN: 5.0000 mL | INTRAMUSCULAR | Status: DC | PRN

## 2022-10-18 MED ORDER — HEPARIN SODIUM (PORCINE) 5000 UNIT/ML IJ SOLN
5000.0000 [IU] | Freq: Three times a day (TID) | INTRAMUSCULAR | Status: DC
  Administered 2022-10-18: 5000 [IU] via SUBCUTANEOUS
  Filled 2022-10-18: qty 1

## 2022-10-18 MED ORDER — KETOROLAC TROMETHAMINE 30 MG/ML IJ SOLN: 30.0000 mg | Freq: Four times a day (QID) | INTRAMUSCULAR | Status: DC | PRN

## 2022-10-18 MED ORDER — INSULIN DETEMIR 100 UNIT/ML ~~LOC~~ SOLN
25.0000 [IU] | Freq: Every day | SUBCUTANEOUS | Status: DC
  Filled 2022-10-18: qty 0.25

## 2022-10-18 MED ORDER — ACETAMINOPHEN 325 MG PO TABS: 650.0000 mg | ORAL_TABLET | Freq: Four times a day (QID) | ORAL | Status: DC | PRN

## 2022-10-18 MED ORDER — ONDANSETRON HCL 4 MG/2ML IJ SOLN: 4.0000 mg | Freq: Four times a day (QID) | INTRAMUSCULAR | Status: DC | PRN

## 2022-10-18 MED ORDER — PREDNISONE 10 MG PO TABS: ORAL_TABLET | ORAL | 0 refills | Status: AC

## 2022-10-18 MED ORDER — CHLORHEXIDINE GLUCONATE CLOTH 2 % EX PADS
6.0000 | MEDICATED_PAD | Freq: Every day | CUTANEOUS | Status: DC
  Administered 2022-10-18: 6 via TOPICAL

## 2022-10-18 MED ORDER — CARVEDILOL 12.5 MG PO TABS: 25.0000 mg | ORAL_TABLET | Freq: Two times a day (BID) | ORAL | Status: DC

## 2022-10-18 MED ORDER — ONDANSETRON HCL 4 MG PO TABS: 4.0000 mg | ORAL_TABLET | Freq: Four times a day (QID) | ORAL | Status: DC | PRN

## 2022-10-18 MED ORDER — AMLODIPINE BESYLATE 5 MG PO TABS: 5.0000 mg | ORAL_TABLET | Freq: Every day | ORAL | Status: DC

## 2022-10-18 MED ORDER — CALCITRIOL 0.25 MCG PO CAPS
ORAL_CAPSULE | ORAL | Status: AC
  Filled 2022-10-18: qty 6

## 2022-10-18 MED ORDER — METHYLPREDNISOLONE SODIUM SUCC 125 MG IJ SOLR
125.0000 mg | Freq: Every day | INTRAMUSCULAR | Status: DC
  Administered 2022-10-18: 125 mg via INTRAVENOUS
  Filled 2022-10-18: qty 2

## 2022-10-18 MED ORDER — ACETAMINOPHEN 500 MG PO TABS
1000.0000 mg | ORAL_TABLET | Freq: Once | ORAL | Status: AC
  Administered 2022-10-18: 1000 mg via ORAL
  Filled 2022-10-18: qty 2

## 2022-10-18 MED ORDER — CLONIDINE HCL 0.1 MG PO TABS
0.3000 mg | ORAL_TABLET | Freq: Three times a day (TID) | ORAL | Status: DC
  Administered 2022-10-18: 0.3 mg via ORAL

## 2022-10-18 MED ORDER — INSULIN ASPART 100 UNIT/ML IJ SOLN
0.0000 [IU] | INTRAMUSCULAR | Status: DC
  Administered 2022-10-18 (×2): 11 [IU] via SUBCUTANEOUS
  Filled 2022-10-18: qty 1

## 2022-10-18 MED ORDER — PENTAFLUOROPROP-TETRAFLUOROETH EX AERO: 1.0000 | INHALATION_SPRAY | CUTANEOUS | Status: DC | PRN

## 2022-10-18 MED ORDER — CALCITRIOL 0.25 MCG PO CAPS
2.2500 ug | ORAL_CAPSULE | ORAL | Status: DC
  Administered 2022-10-18: 2.25 ug via ORAL
  Filled 2022-10-18: qty 9

## 2022-10-18 MED ORDER — LABETALOL HCL 5 MG/ML IV SOLN
INTRAVENOUS | Status: AC
  Administered 2022-10-18: 10 mg
  Filled 2022-10-18: qty 4

## 2022-10-18 MED ORDER — METHYLPREDNISOLONE SODIUM SUCC 125 MG IJ SOLR: 125.0000 mg | Freq: Once | INTRAMUSCULAR | Status: DC

## 2022-10-18 MED ORDER — IOHEXOL 300 MG/ML  SOLN
80.0000 mL | Freq: Once | INTRAMUSCULAR | Status: AC | PRN
  Administered 2022-10-18: 80 mL via INTRAVENOUS

## 2022-10-18 MED ORDER — LIDOCAINE-PRILOCAINE 2.5-2.5 % EX CREA: 1.0000 | TOPICAL_CREAM | CUTANEOUS | Status: DC | PRN

## 2022-10-18 MED ORDER — HYDRALAZINE HCL 20 MG/ML IJ SOLN: 10.0000 mg | Freq: Four times a day (QID) | INTRAMUSCULAR | Status: DC | PRN

## 2022-10-18 MED ORDER — ACETAMINOPHEN 650 MG RE SUPP: 650.0000 mg | Freq: Four times a day (QID) | RECTAL | Status: DC | PRN

## 2022-10-18 MED ORDER — METHYLPREDNISOLONE SODIUM SUCC 125 MG IJ SOLR
60.0000 mg | Freq: Once | INTRAMUSCULAR | Status: AC
  Administered 2022-10-18: 60 mg via INTRAVENOUS
  Filled 2022-10-18: qty 2

## 2022-10-18 MED ORDER — LABETALOL HCL 5 MG/ML IV SOLN
10.0000 mg | INTRAVENOUS | Status: DC | PRN
  Administered 2022-10-18: 10 mg via INTRAVENOUS
  Filled 2022-10-18: qty 4

## 2022-10-18 NOTE — H&P (Signed)
History and Physical    Patient: Kelly Key TGY:[id number] DOB: [birth year] DOA: 10/17/2022 DOS: the patient was seen and examined on 10/18/2022 PCP: [name], MD  Patient coming from: Home  Chief Complaint:  Chief Complaint  Patient presents with   Allergic Reaction   HPI: Kelly Key is a 51 y.o. male with medical history significant of ESRD, diastolic CHF, COPD, diabetes mellitus type 2, hyperlipidemia, malignant hypertension, and more presents the ED with a chief complaint of dysphagia.  Patient reports yesterday he took his insulin and Ozempic back-to-back at 1 PM.  Immediately started having nausea and vomiting for the rest of the day.  Patient reports that then he started feeling like he was choking, could not swallow, could not talk.  He reports that it felt like somebody had there grip around his neck and were squeezing tight.  This all started 11 PM.  In all of his episodes of nausea and vomiting there was no blood.  He describes a throat pain that feels like a rawness.  The symptoms have eased off now he thinks that the medications in the ED that helped.  Patient does not wear oxygen at home, but he is wearing oxygen here because he dropped down into the 80s while he was sleeping on the.  Patient reports that neither these medications are new to him.  He has been on Ozempic for 2 years and insulin for 15 years so is not sure why he had this reaction after taking those medications.  Patient reports no other new exposures.  He was consulted in the ED and recommends monitoring patient until he had dialysis, at which time they think he will have improvement as they think this edema is from volume overload.  Nephrology is aware of patient and plans for dialysis this a.m.  Patient does not smoke, does not drink, does not use illicit drugs per his report.  He is vaccinated for COVID.  Patient is full code. Review of Systems: As mentioned in the history of present illness. All other systems  reviewed and are negative. Past Medical History:  Diagnosis Date   Acute diastolic CHF (congestive heart failure) ([place]) 06/11/2012   EF 50-55% [facility])   Chronic kidney disease    COPD (chronic obstructive pulmonary disease) (H[facility])    Diabetes mellitus    A1c 11.5 (06/11/2012).   Dyspnea    Gout    Hepatic steatosis 06/11/2012   Elevated LFTs   Hyperlipemia    Malignant hypertension    Microcytic anemia 06/12/2012   Obesity    Past Surgical History:  Procedure Laterality Date   AV FISTULA PLACEMENT Left 11/11/2019   Procedure: ARTERIOVENOUS (AV) FISTULA CREATION LEFT BRACHIOCEPHALIC ARM;  Surgeon: Serafina Mitchell, MD;  Location: [place];  Service: Vascular;  Laterality: Left;   COLONOSCOPY WITH PROPOFOL N/A 09/02/2021   Procedure: COLONOSCOPY WITH PROPOFOL;  Surgeon: Jonathon Bellows, MD;  Location: [facility] ENDOSCOPY;  Service: Gastroenterology;  Laterality: N/A;   IR FLUORO GUIDE CV LINE RIGHT  07/08/2019   IR REMOVAL TUN CV CATH W/O FL  03/23/2020   IR US GUIDE VASC ACCESS RIGHT  07/08/2019   LEFT HEART CATH AND CORONARY ANGIOGRAPHY N/A 06/12/2021   Procedure: LEFT HEART CATH AND CORONARY ANGIOGRAPHY;  Surgeon: Belva Crome, MD;  Location: [place] CV LAB;  Service: Cardiovascular;  Laterality: N/A;   NO PAST SURGERIES     VASCULAR SURGERY     Social History:  reports that he  has never smoked. He has been exposed to tobacco smoke. He has never used smokeless tobacco. He reports that he does not currently use alcohol. He reports that he does not use drugs.  Allergies  Allergen Reactions   Hydralazine Anxiety    Hallucinations    Lisinopril Swelling    Family History  Problem Relation Age of Onset   Gout Mother    Asthma Mother    Diabetes Father    Heart failure Father    Diabetes Sister    Hypertension Brother    Pancreatic cancer Brother    Diabetes Sister     Prior to Admission medications   Medication Sig Start Date End Date Taking? Authorizing Provider   acetaminophen (TYLENOL) 500 MG tablet Take 1,000 mg by mouth every 6 (six) hours as needed for moderate pain or headache.    [provider]  albuterol (VENTOLIN HFA) 108 (90 Base) MCG/ACT inhaler Inhale 2 puffs into the lungs every 4 (four) hours as needed for wheezing or shortness of breath.  03/24/19   [provider]  allopurinol (ZYLOPRIM) 100 MG tablet Take 2 tablets (200 mg total) by mouth daily. 08/04/19   Love, Ivan Anchors, PA-C  AMBULATORY NON FORMULARY MEDICATION Inject 0.2 ml by intracavernosal route as directed.  Medication Name: TriMix PGE 10 mcg Pap   30 mg Phent 1 mg 01/01/22   Stoneking, Reece Leader., MD  amLODipine (NORVASC) 5 MG tablet Take 5 mg by mouth daily. 01/30/22   [provider]  ammonium lactate (AMLACTIN) 12 % cream Apply 1 Application topically in the morning and at bedtime. 09/08/22   [provider]  aspirin EC 81 MG EC tablet Take 1 tablet (81 mg total) by mouth daily. 08/04/19   Love, Ivan Anchors, PA-C  atorvastatin (LIPITOR) 80 MG tablet Take 1 tablet (80 mg total) by mouth daily. 12/05/19   Arnoldo Lenis, MD  Calcium Carbonate Antacid (CALCIUM CARBONATE, DOSED IN MG ELEMENTAL CALCIUM,) 1250 MG/5ML SUSP Take 5 mLs (500 mg of elemental calcium total) by mouth every 6 (six) hours as needed for indigestion. 07/16/19   Amin, Jeanella Flattery, MD  carvedilol (COREG) 25 MG tablet Take 1 tablet (25 mg total) by mouth 2 (two) times daily with a meal. 08/03/19   Love, Ivan Anchors, PA-C  cloNIDine (CATAPRES) 0.3 MG tablet Take 0.3 mg by mouth 3 (three) times daily.    [provider]  esomeprazole (NEXIUM) 20 MG capsule Take 20 mg by mouth daily. 01/23/22   [provider]  ezetimibe (ZETIA) 10 MG tablet Take 10 mg by mouth every evening. 01/20/19   [provider]  fluticasone (FLONASE) 50 MCG/ACT nasal spray Place 1 spray into both nostrils daily as needed for allergies or rhinitis.    [provider]  furosemide  (LASIX) 80 MG tablet Take 1 tablet (80 mg total) by mouth daily. 05/16/21   Shahmehdi, Valeria Batman, MD  gabapentin (NEURONTIN) 100 MG capsule Take 1 capsule (100 mg total) by mouth 3 (three) times daily. 08/03/19   Love, Ivan Anchors, PA-C  glucose blood (ACCU-CHEK AVIVA PLUS) test strip Use as instructed TO CHECK BLOOD GLUCOSE THREE TIMES DAILY 08/15/20   Nida, Marella Chimes, MD  HUMALOG KWIKPEN 100 UNIT/ML KwikPen Inject 8-14 Units into the skin 3 (three) times daily. 08/19/22   [provider]  hydrOXYzine (ATARAX) 25 MG tablet Take 25 mg by mouth 3 (three) times daily as needed for anxiety. 11/28/21   [provider]  Insulin Pen Needle (B-D ULTRAFINE III SHORT PEN) 31G X 8 MM MISC 1 each by Does not apply route as directed. 08/13/20   Cassandria Anger, MD  LEVEMIR FLEXTOUCH 100 UNIT/ML FlexTouch Pen INJECT 50 UNITS SUBCUTANEOUSLY AT BEDTIME Patient taking differently: Inject 50 Units into the skin at bedtime. 07/09/21   Brita Romp, NP  linaclotide Rolan Lipa) 145 MCG CAPS capsule Take 1 capsule (145 mcg total) by mouth daily before breakfast. 04/05/18   Manuella Ghazi, Pratik D, DO  multivitamin (RENA-VIT) TABS tablet Take 1 tablet by mouth at bedtime. 08/03/19   Love, Ivan Anchors, PA-C  mupirocin ointment (BACTROBAN) 2 % Apply 1 Application topically 2 (two) times daily.    [provider]  OZEMPIC, 1 MG/DOSE, 4 MG/3ML SOPN Inject 1 mg into the skin once a week. Fridays.    [provider]  sevelamer carbonate (RENVELA) 800 MG tablet Take 3 tablets (2,400 mg total) by mouth 3 (three) times daily with meals. 08/03/19   Love, Ivan Anchors, PA-C  vardenafil (LEVITRA) 20 MG tablet Take 1 tablet (20 mg total) by mouth daily as needed for erectile dysfunction. 08/16/21   Primus Bravo., MD    Physical Exam: Vitals:   10/18/22 0430 10/18/22 0500 10/18/22 0530 10/18/22 0600  BP: (!) 150/104 (!) 175/98 (!) 171/112 (!) 159/99  Pulse: (!) 105 (!) 104 (!) 108 (!) 102  Resp: 15  14 13 15   Temp:    98.6 F (37 C)  TempSrc:    Oral  SpO2: 90% 100% 100% 100%  Weight:      Height:       1.  General: Patient lying supine in bed,  no acute distress   2. Psychiatric: Alert and oriented x 3, mood and behavior normal for situation, pleasant and cooperative with exam   3. Neurologic: Speech and language are normal, face is symmetric, moves all 4 extremities voluntarily, at baseline without acute deficits on limited exam   4. HEENMT:  Head is atraumatic, normocephalic, pupils reactive to light, neck is supple, trachea is midline, mucous membranes are moist   5. Respiratory : Lungs are clear to auscultation bilaterally without wheezing, rhonchi, rales, no cyanosis, no increase in work of breathing or accessory muscle use   6. Cardiovascular : Heart rate tachycardic, rhythm is regular, no murmurs, rubs or gallops, peripheral edema present, peripheral pulses palpated   7. Gastrointestinal:  Abdomen is soft, nondistended, nontender to palpation bowel sounds active, no masses or organomegaly palpated   8. Skin:  Skin is warm, dry and intact without rashes, acute lesions, or ulcers on limited exam   9.Musculoskeletal:  No acute deformities or trauma, no asymmetry in tone, peripheral edema present, peripheral pulses palpated, no tenderness to palpation in the extremities  Data Reviewed: In the ED Temp 99.1, heart rate 100-115, respiratory rate 12-28, blood pressure 142/74-218/108, satting at 100% No leukocytosis with white blood cell count of 7.0, hemoglobin 11.6, platelets 251 Chemistry reveals an elevated BUN at 58, elevated creatinine at 10.07 in this ESRD patient CT soft tissue neck shows mild edema about the epiglottis that could be acute supraglottic [facility] ENT thinks that patient has edema around epiglottis secondary to volume overload Patient was given 120 Solu-Medrol, Zofran, Pepcid, Benadryl in the ED Admission requested for observation until patient has  dialysis and patient can be reassessed  Assessment and Plan: * Dysphagia - CT soft tissue neck shows mild edema that could be indicative of acute supraglottitis - ENT was  consulted and recommended steroid, monitoring patient at [facility], and dialysis with the assumption that this edema is just from fluid overload - 120s Solu-Medrol given in the ED - Continue 125 mg IV Solu-Medrol daily - Patient reports symptom improvement already - Continue to monitor  ESRD (end stage renal disease) ([place]) - Plan for dialysis today and as it would be his normal dialysis day - Nephrology aware of patient and planning for dialysis this a.m. - Anticipate improvement in dysphagia with fluid removal  Volume overload - Likely secondary to ESRD and CHF - Peripheral edema, and then this questionable edema in his neck - Defer to nephrology for volume control  Diabetes mellitus type 2 in nonobese [facility]) - Patient takes 50 units of Levemir baseline - Continue with 25 units of basal insulin given patient's n.p.o. status - Monitor CBG every 4 hours - Currently n.p.o. - Continue to monitor  Essential hypertension, benign - Holding all p.o. meds given patient's dysphagia - At home patient takes amlodipine, clonidine, Lasix -As needed IV hydralazine for elevated blood pressure - Continue to monitor      Advance Care Planning:   Code Status: Full Code  Consults: Nephrology  Family Communication: No family at bedside  Severity of Illness: The appropriate patient status for this patient is OBSERVATION. Observation status is judged to be reasonable and necessary in order to provide the required intensity of service to ensure the patient's safety. The patient's presenting symptoms, physical exam findings, and initial radiographic and laboratory data in the context of their medical condition is felt to place them at decreased risk for further clinical deterioration. Furthermore, it is anticipated that the  patient will be medically stable for discharge from the hospital within 2 midnights of admission.   Author: Rolla Plate, DO 10/18/2022 6:27 AM  For on call review [email or web address].

## 2022-10-18 NOTE — Assessment & Plan Note (Signed)
-   Likely secondary to ESRD and CHF - Peripheral edema, and then this questionable edema in his neck - Defer to nephrology for volume control

## 2022-10-18 NOTE — Discharge Summary (Signed)
Physician Discharge Summary  JEOFFREY ELEAZER WTU:[id number] DOB: [birth year] DOA: 10/17/2022  PCP: [name], MD  Admit date: 10/17/2022  Discharge date: 10/18/2022  Admitted From:Home  Disposition:  Home  Recommendations for Outpatient Follow-up:  Follow up with PCP in 1-2 weeks Follow-up with ENT in 1-2 weeks with referral sent to Dr. Benjamine Mola Continue short prednisone taper as prescribed Continue other home medications as prior Continue with team hemodialysis with last session 12/23  Home Health: None  Equipment/Devices: None  Discharge Condition:Stable  CODE STATUS: Full  Diet recommendation: Renal/carb modified  Brief/Interim Summary: Kelly Key is a 51 y.o. male with medical history significant of ESRD, diastolic CHF, COPD, diabetes mellitus type 2, hyperlipidemia, malignant hypertension, and more presents the ED with a chief complaint of dysphagia.  Patient reports yesterday he took his insulin and Ozempic back-to-back at 1 PM.  Immediately started having nausea and vomiting for the rest of the day.  Patient reports that then he started feeling like he was choking, could not swallow, could not talk.   He was admitted with concerns of dysphagia and CT soft tissue demonstrated acute supraglottitis.  ENT was consulted by EDP who recommended close monitoring and initiation of IV Solu-Medrol which was given.  He is now able to tolerate diet and does not have any issues or concerns relating to his speech or difficulty swallowing.  He has no labial or lingual inflammation noted and appears to be in stable condition to discharge after hemodialysis has been completed today.  He will be on a steroid taper as prescribed for several more days and this will be short given his history of type 2 diabetes with hyperglycemia.  He will have close follow-up with ENT with referral sent as noted above.  No other acute issues or concerns noted during this short hospitalization.  Discharge Diagnoses:   Principal Problem:   Dysphagia Active Problems:   Essential hypertension, benign   Diabetes mellitus type 2 in nonobese (HCC)   Volume overload   ESRD (end stage renal disease) ([place])  Principal discharge diagnosis: Dysphagia in the setting of acute supraglottitis likely related to chemical irritation from nausea/vomiting.  Discharge Instructions  Discharge Instructions     Ambulatory referral to ENT   Complete by: As directed    Diet - low sodium heart healthy   Complete by: As directed    Increase activity slowly   Complete by: As directed       Allergies as of 10/18/2022       Reactions   Hydralazine Anxiety   Hallucinations    Lisinopril Swelling        Medication List     TAKE these medications    Accu-Chek Aviva Plus test strip Generic drug: glucose blood Use as instructed TO CHECK BLOOD GLUCOSE THREE TIMES DAILY   acetaminophen 500 MG tablet Commonly known as: TYLENOL Take 1,000 mg by mouth every 6 (six) hours as needed for moderate pain or headache.   albuterol 108 (90 Base) MCG/ACT inhaler Commonly known as: VENTOLIN HFA Inhale 2 puffs into the lungs every 4 (four) hours as needed for wheezing or shortness of breath.   allopurinol 100 MG tablet Commonly known as: ZYLOPRIM Take 2 tablets (200 mg total) by mouth daily.   AMBULATORY NON FORMULARY MEDICATION Inject 0.2 ml by intracavernosal route as directed.  Medication Name: TriMix PGE 10 mcg Pap   30 mg Phent 1 mg   amLODipine 5 MG tablet Commonly known as: NORVASC Take  5 mg by mouth daily.   ammonium lactate 12 % cream Commonly known as: AMLACTIN Apply 1 Application topically in the morning and at bedtime.   aspirin EC 81 MG tablet Take 1 tablet (81 mg total) by mouth daily.   atorvastatin 80 MG tablet Commonly known as: LIPITOR Take 1 tablet (80 mg total) by mouth daily.   B-D ULTRAFINE III SHORT PEN 31G X 8 MM Misc Generic drug: Insulin Pen Needle 1 each by Does not apply  route as directed.   calcium carbonate (dosed in mg elemental calcium) 1250 MG/5ML Susp Take 5 mLs (500 mg of elemental calcium total) by mouth every 6 (six) hours as needed for indigestion.   carvedilol 25 MG tablet Commonly known as: COREG Take 1 tablet (25 mg total) by mouth 2 (two) times daily with a meal.   cloNIDine 0.3 MG tablet Commonly known as: CATAPRES Take 0.3 mg by mouth 3 (three) times daily.   esomeprazole 20 MG capsule Commonly known as: NEXIUM Take 20 mg by mouth daily.   ezetimibe 10 MG tablet Commonly known as: ZETIA Take 10 mg by mouth every evening.   fluticasone 50 MCG/ACT nasal spray Commonly known as: FLONASE Place 1 spray into both nostrils daily as needed for allergies or rhinitis.   furosemide 80 MG tablet Commonly known as: LASIX Take 1 tablet (80 mg total) by mouth daily.   gabapentin 100 MG capsule Commonly known as: NEURONTIN Take 1 capsule (100 mg total) by mouth 3 (three) times daily.   HumaLOG KwikPen 100 UNIT/ML KwikPen Generic drug: insulin lispro Inject 8-14 Units into the skin 3 (three) times daily.   hydrOXYzine 25 MG tablet Commonly known as: ATARAX Take 25 mg by mouth 3 (three) times daily as needed for anxiety.   Levemir FlexTouch 100 UNIT/ML FlexPen Generic drug: insulin detemir INJECT 50 UNITS SUBCUTANEOUSLY AT BEDTIME What changed: See the new instructions.   linaclotide 145 MCG Caps capsule Commonly known as: LINZESS Take 1 capsule (145 mcg total) by mouth daily before breakfast.   multivitamin Tabs tablet Take 1 tablet by mouth at bedtime.   mupirocin ointment 2 % Commonly known as: BACTROBAN Apply 1 Application topically 2 (two) times daily.   Ozempic (1 MG/DOSE) 4 MG/3ML Sopn Generic drug: Semaglutide (1 MG/DOSE) Inject 1 mg into the skin once a week. Fridays.   predniSONE 10 MG tablet Commonly known as: DELTASONE Take 4 tablets (40 mg total) by mouth daily for 1 day, THEN 3 tablets (30 mg total) daily for  1 day, THEN 2 tablets (20 mg total) daily for 1 day, THEN 1 tablet (10 mg total) daily for 1 day. Start taking on: October 18, 2022   sevelamer carbonate 800 MG tablet Commonly known as: RENVELA Take 3 tablets (2,400 mg total) by mouth 3 (three) times daily with meals.   vardenafil 20 MG tablet Commonly known as: LEVITRA Take 1 tablet (20 mg total) by mouth daily as needed for erectile dysfunction.        Follow-up Information     [name], MD. Schedule an appointment as soon as possible for a visit in 1 week(s).   Specialty: Internal Medicine Contact information: [place] [address] [phone]                Allergies  Allergen Reactions   Hydralazine Anxiety    Hallucinations    Lisinopril Swelling    Consultations: Nephrology for hemodialysis EDP discussed with ENT  Procedures/Studies: CT Soft Tissue Neck W Contrast  Result Date: 10/18/2022 CLINICAL DATA:  Initial evaluation for throat edema, vomiting, difficulty swallowing. EXAM: CT NECK WITH CONTRAST TECHNIQUE: Multidetector CT imaging of the neck was performed using the standard protocol following the bolus administration of intravenous contrast. RADIATION DOSE REDUCTION: This exam was performed according to the departmental dose-optimization program which includes automated exposure control, adjustment of the mA and/or kV according to patient size and/or use of iterative reconstruction technique. CONTRAST:  17mL OMNIPAQUE IOHEXOL 300 MG/ML  SOLN COMPARISON:  None Available. FINDINGS: Pharynx and larynx: Oral cavity within normal limits. Few scattered dental caries noted. No acute inflammatory changes seen about the dentition. Palatine tonsils symmetric and within normal limits. Nasopharynx within normal limits. No retropharyngeal collection. There is mild swelling/edema involving the epiglottis and left greater than right aryepiglottic folds. Mucosal edema extends to involve  the left pharynx/hypopharynx (series 3, images 61, 64). Findings consistent with acute supraglottitis. This could be either infectious or inflammatory/allergic in nature. Secondary narrowing and effacement of the airway just above the glottis (series 3, image 67). This may in part be related to phonation. Glottis otherwise symmetric and within normal limits. Subglottic airway widely patent clear. Salivary glands: Salivary glands including the parotid and submandibular glands are within normal limits. Thyroid: Normal. Lymph nodes: No enlarged or pathologic adenopathy seen within the neck. Vascular: Normal intravascular enhancement seen throughout the neck. Moderate atheromatous change about the aortic arch, carotid bifurcations, and carotid siphons. Limited intracranial: Unremarkable. Visualized orbits: Unremarkable. Mastoids and visualized paranasal sinuses: Mild mucosal thickening noted about the floor of the right maxillary sinus. Visualized paranasal sinuses are otherwise largely clear. Mastoid air cells and middle ear cavities are well pneumatized and free of fluid. Skeleton: No discrete or worrisome osseous lesions. Probable tiny os odontoideum noted. Upper chest: Visualized upper chest demonstrates no acute finding. Other: None. IMPRESSION: 1. Mild edema about the epiglottis and aryepiglottic folds, consistent with acute supraglottitis. This could be either infectious or inflammatory/allergic in nature. Secondary narrowing and effacement of the airway just above the glottis. Close clinical monitoring recommended, as this patient is potentially at risk for airway compromise. 2. No other acute abnormality within the neck. Aortic Atherosclerosis (ICD10-I70.0). Electronically Signed   By: Jeannine Boga M.D.   On: 10/18/2022 01:28   US ARTERIAL SEG MULTIPLE LE (ABI, SEGMENTAL PRESSURES, PVR'S)  Result Date: 10/02/2022 CLINICAL DATA:  51 year old male with history of thromboembolic events. EXAM:  NONINVASIVE PHYSIOLOGIC VASCULAR STUDY OF BILATERAL LOWER EXTREMITIES TECHNIQUE: Non-invasive vascular evaluation of both lower extremities was performed at rest, including calculation of ankle-brachial indices, multiple segmental pressure evaluation, segmental Doppler and segmental pulse volume recording. COMPARISON:  None Available. FINDINGS: Right Lower Extremity Resting ABI:  0.10 Resting TBI: 0.58 Segmental Pressures: Noncompressible proximal thigh pressures. Otherwise normal segmental pressures, no significant (20 mmHg) pressure gradient between adjacent segments. Great toe pressure: 89 Arterial Waveforms: Monophasic waveforms in the popliteal artery. Biphasic waveforms otherwise throughout. PVRs: Normal PVRs with maintained waveform amplitude, augmentation and quality. Left Lower Extremity: Resting ABI: 0.92 Resting TBI: 0.61 Segmental Pressures: Noncompressible proximal 5 pressures. Otherwise normal segmental pressures, no significant (20 mmHg) pressure gradient between adjacent segments. Great toe pressure: 94 Arterial Waveforms: Monophasic waveforms in the popliteal artery. Biphasic waveforms otherwise throughout. PVRs: Dampened third through fifth digital PVRs. Otherwise normal PVRs throughout. Other: Symmetric upper extremity pressures. IMPRESSION: 1. Dampened PVRs in the third through fifth left digits as could be seen with distal emboli versus pedal atherosclerotic disease. 2.  Mixed biphasic and monophasic waveforms throughout the bilateral lower extremities with noncompressible proximal thigh pressures bilaterally. This is suggestive of possible inflow vessel disease in addition to mild multifocal outflow vessel disease. 3. Normal right (1.10) and mildly abnormal left (0.92) ankle brachial indices. Ruthann Cancer, MD [facility] Electronically Signed   By: Ruthann Cancer M.D.   On: 10/02/2022 08:57   DG Chest Port 1 View  Result Date:  09/20/2022 CLINICAL DATA:  Shortness of breath for 2 days, initial encounter EXAM: PORTABLE CHEST 1 VIEW COMPARISON:  07/28/2022 FINDINGS: Cardiac shadow is within normal limits. Aortic calcifications are seen. Lungs are well aerated bilaterally. Mild central vascular congestion is noted. No bony abnormality is seen. IMPRESSION: Mild changes of CHF without edema. Electronically Signed   By: Inez Catalina M.D.   On: 09/20/2022 02:22     Discharge Exam: Vitals:   10/18/22 0847 10/18/22 1246  BP: (!) 198/114 (!) 181/96  Pulse: 99 100  Resp:  20  Temp:  97.6 F (36.4 C)  SpO2:  100%   Vitals:   10/18/22 0730 10/18/22 0749 10/18/22 0847 10/18/22 1246  BP: (!) 166/95 (S) (!) 194/128 (!) 198/114 (!) 181/96  Pulse: 98 100 99 100  Resp: 15 16  20   Temp:  98.1 F (36.7 C)  97.6 F (36.4 C)  TempSrc:  Oral  Oral  SpO2: 100% 100%  100%  Weight:      Height:        General: Pt is alert, awake, not in acute distress Cardiovascular: RRR, S1/S2 +, no rubs, no gallops Respiratory: CTA bilaterally, no wheezing, no rhonchi Abdominal: Soft, NT, ND, bowel sounds + Extremities: no edema, no cyanosis    The results of significant diagnostics from this hospitalization (including imaging, microbiology, ancillary and laboratory) are listed below for reference.     Microbiology: Recent Results (from the past 240 hour(s))  Resp panel by RT-PCR (RSV, Flu A&B, Covid) Anterior Nasal Swab     Status: None   Collection Time: 10/17/22  9:20 PM   Specimen: Anterior Nasal Swab  Result Value Ref Range Status   SARS Coronavirus 2 by RT PCR NEGATIVE NEGATIVE Final    Comment: (NOTE) SARS-CoV-2 target nucleic acids are NOT DETECTED.  The SARS-CoV-2 RNA is generally detectable in upper respiratory specimens during the acute phase of infection. The lowest concentration of SARS-CoV-2 viral copies this assay can detect is 138 copies/mL. A negative result does not preclude SARS-Cov-2 infection and should not  be used as the sole basis for treatment or other patient management decisions. A negative result may occur with  improper specimen collection/handling, submission of specimen other than nasopharyngeal swab, presence of viral mutation(s) within the areas targeted by this assay, and inadequate number of viral copies(<138 copies/mL). A negative result must be combined with clinical observations, patient history, and epidemiological information. The expected result is Negative.  Fact Sheet for Patients:  EntrepreneurPulse.com.au  Fact Sheet for Healthcare Providers:  IncredibleEmployment.be  This test is no t yet approved or cleared by the [place] FDA and  has been authorized for detection and/or diagnosis of SARS-CoV-2 by FDA under an Emergency Use Authorization (EUA). This EUA will remain  in effect (meaning this test can be used) for the duration of the COVID-19 declaration under Section 564(b)(1) of the Act, 21 U.S.C.section 360bbb-3(b)(1), unless the authorization is terminated  or revoked sooner.       Influenza A by PCR NEGATIVE NEGATIVE  Final   Influenza B by PCR NEGATIVE NEGATIVE Final    Comment: (NOTE) The Xpert Xpress SARS-CoV-2/FLU/RSV plus assay is intended as an aid in the diagnosis of influenza from Nasopharyngeal swab specimens and should not be used as a sole basis for treatment. Nasal washings and aspirates are unacceptable for Xpert Xpress SARS-CoV-2/FLU/RSV testing.  Fact Sheet for Patients: EntrepreneurPulse.com.au  Fact Sheet for Healthcare Providers: IncredibleEmployment.be  This test is not yet approved or cleared by the [place] FDA and has been authorized for detection and/or diagnosis of SARS-CoV-2 by FDA under an Emergency Use Authorization (EUA). This EUA will remain in effect (meaning this test can be used) for the duration of the COVID-19 declaration under Section  564(b)(1) of the Act, 21 U.S.C. section 360bbb-3(b)(1), unless the authorization is terminated or revoked.     Resp Syncytial Virus by PCR NEGATIVE NEGATIVE Final    Comment: (NOTE) Fact Sheet for Patients: EntrepreneurPulse.com.au  Fact Sheet for Healthcare Providers: IncredibleEmployment.be  This test is not yet approved or cleared by the [place] FDA and has been authorized for detection and/or diagnosis of SARS-CoV-2 by FDA under an Emergency Use Authorization (EUA). This EUA will remain in effect (meaning this test can be used) for the duration of the COVID-19 declaration under Section 564(b)(1) of the Act, 21 U.S.C. section 360bbb-3(b)(1), unless the authorization is terminated or revoked.  Performed at [facility], [address], [place], [place] [address]   Group A Strep by PCR     Status: None   Collection Time: 10/17/22  9:20 PM   Specimen: Throat; Sterile Swab  Result Value Ref Range Status   Group A Strep by PCR NOT DETECTED NOT DETECTED Final    Comment: Performed at [facility], [address] [place], [place] [address]     Labs: BNP (last 3 results) Recent Labs    07/28/22 0234  BNP 481.8*   Basic Metabolic Panel: Recent Labs  Lab 10/17/22 2120 10/18/22 0540  NA 139 136  K 4.5 5.7*  CL 93* 96*  CO2 30 26  GLUCOSE 144* 306*  BUN 58* 64*  CREATININE 10.07* 10.37*  CALCIUM 9.5 9.2  MG  --  2.4  PHOS  --  4.9*   Liver Function Tests: Recent Labs  Lab 10/17/22 2120 10/18/22 0540  AST 28 24  ALT 26 24  ALKPHOS 69 69  BILITOT 0.6 0.5  PROT 8.6* 8.1  ALBUMIN 4.2 3.8   No results for input(s): "LIPASE", "AMYLASE" in the last 168 hours. No results for input(s): "AMMONIA" in the last 168 hours. CBC: Recent Labs  Lab 10/17/22 2120 10/18/22 0540  WBC 7.0 7.8  NEUTROABS 3.9 6.3  HGB 11.6* 11.6*  HCT 35.5* 36.2*  MCV 88.1 89.4  PLT 251 250   Cardiac Enzymes: No results for input(s): "CKTOTAL",  "CKMB", "CKMBINDEX", "TROPONINI" in the last 168 hours. BNP: Invalid input(s): "POCBNP" CBG: Recent Labs  Lab 10/17/22 2113 10/18/22 0541 10/18/22 0743 10/18/22 1109  GLUCAP 128* 282* 327* 303*   D-Dimer No results for input(s): "DDIMER" in the last 72 hours. Hgb A1c No results for input(s): "HGBA1C" in the last 72 hours. Lipid Profile No results for input(s): "CHOL", "HDL", "LDLCALC", "TRIG", "CHOLHDL", "LDLDIRECT" in the last 72 hours. Thyroid function studies No results for input(s): "TSH", "T4TOTAL", "T3FREE", "THYROIDAB" in the last 72 hours.  Invalid input(s): "FREET3" Anemia work up No results for input(s): "VITAMINB12", "FOLATE", "FERRITIN", "TIBC", "IRON", "RETICCTPCT" in the last 72 hours. Urinalysis  Component Value Date/Time   COLORURINE STRAW (A) 04/20/2021 1740   APPEARANCEUR CLEAR 04/20/2021 1740   LABSPEC 1.011 04/20/2021 1740   PHURINE 8.0 04/20/2021 1740   GLUCOSEU 150 (A) 04/20/2021 1740   HGBUR NEGATIVE 04/20/2021 1740   BILIRUBINUR NEGATIVE 04/20/2021 1740   KETONESUR NEGATIVE 04/20/2021 1740   PROTEINUR 100 (A) 04/20/2021 1740   UROBILINOGEN 0.2 06/11/2012 0518   NITRITE NEGATIVE 04/20/2021 1740   LEUKOCYTESUR NEGATIVE 04/20/2021 1740   Sepsis Labs Recent Labs  Lab 10/17/22 2120 10/18/22 0540  WBC 7.0 7.8   Microbiology Recent Results (from the past 240 hour(s))  Resp panel by RT-PCR (RSV, Flu A&B, Covid) Anterior Nasal Swab     Status: None   Collection Time: 10/17/22  9:20 PM   Specimen: Anterior Nasal Swab  Result Value Ref Range Status   SARS Coronavirus 2 by RT PCR NEGATIVE NEGATIVE Final    Comment: (NOTE) SARS-CoV-2 target nucleic acids are NOT DETECTED.  The SARS-CoV-2 RNA is generally detectable in upper respiratory specimens during the acute phase of infection. The lowest concentration of SARS-CoV-2 viral copies this assay can detect is 138 copies/mL. A negative result does not preclude SARS-Cov-2 infection and should not  be used as the sole basis for treatment or other patient management decisions. A negative result may occur with  improper specimen collection/handling, submission of specimen other than nasopharyngeal swab, presence of viral mutation(s) within the areas targeted by this assay, and inadequate number of viral copies(<138 copies/mL). A negative result must be combined with clinical observations, patient history, and epidemiological information. The expected result is Negative.  Fact Sheet for Patients:  EntrepreneurPulse.com.au  Fact Sheet for Healthcare Providers:  IncredibleEmployment.be  This test is no t yet approved or cleared by the [place] FDA and  has been authorized for detection and/or diagnosis of SARS-CoV-2 by FDA under an Emergency Use Authorization (EUA). This EUA will remain  in effect (meaning this test can be used) for the duration of the COVID-19 declaration under Section 564(b)(1) of the Act, 21 U.S.C.section 360bbb-3(b)(1), unless the authorization is terminated  or revoked sooner.       Influenza A by PCR NEGATIVE NEGATIVE Final   Influenza B by PCR NEGATIVE NEGATIVE Final    Comment: (NOTE) The Xpert Xpress SARS-CoV-2/FLU/RSV plus assay is intended as an aid in the diagnosis of influenza from Nasopharyngeal swab specimens and should not be used as a sole basis for treatment. Nasal washings and aspirates are unacceptable for Xpert Xpress SARS-CoV-2/FLU/RSV testing.  Fact Sheet for Patients: EntrepreneurPulse.com.au  Fact Sheet for Healthcare Providers: IncredibleEmployment.be  This test is not yet approved or cleared by the [place] FDA and has been authorized for detection and/or diagnosis of SARS-CoV-2 by FDA under an Emergency Use Authorization (EUA). This EUA will remain in effect (meaning this test can be used) for the duration of the COVID-19 declaration under Section  564(b)(1) of the Act, 21 U.S.C. section 360bbb-3(b)(1), unless the authorization is terminated or revoked.     Resp Syncytial Virus by PCR NEGATIVE NEGATIVE Final    Comment: (NOTE) Fact Sheet for Patients: EntrepreneurPulse.com.au  Fact Sheet for Healthcare Providers: IncredibleEmployment.be  This test is not yet approved or cleared by the [place] FDA and has been authorized for detection and/or diagnosis of SARS-CoV-2 by FDA under an Emergency Use Authorization (EUA). This EUA will remain in effect (meaning this test can be used) for the duration of the COVID-19 declaration under Section 564(b)(1) of the Act, 21  U.S.C. section 360bbb-3(b)(1), unless the authorization is terminated or revoked.  Performed at [facility], [address] [place], [place] [address]   Group A Strep by PCR     Status: None   Collection Time: 10/17/22  9:20 PM   Specimen: Throat; Sterile Swab  Result Value Ref Range Status   Group A Strep by PCR NOT DETECTED NOT DETECTED Final    Comment: Performed at [facility], [address], [place], [place] [address]     Time coordinating discharge: 35 minutes  SIGNED:   Rodena Goldmann, DO [facility] 10/18/2022, 2:22 PM  If 7PM-7AM, please contact night-coverage [email or web address]

## 2022-10-18 NOTE — Progress Notes (Signed)
Contacted about Kelly Key who has a PMH significant for chronic diastolic CHF, COPD, DM type 2, HTN, HLD, and ESRD on HD TTS at [facility] who presented to [facility] ED c/o choking sensation after taking Ozempic and was concerned about an allergic reaction.  CT of soft tissue of neck revealed mild edema about the epiglottis consistent with acute supraglottitis.  ENT was contacted and was given solumedrol.  He was admitted under observation and we were consulted to provide his regularly scheduled dialysis.  Orders have been put in and due to hospital census, he cannot be done until 2nd shift, however Dr. Manuella Ghazi believes he is stable enough to wait until then.  HD RN notified.

## 2022-10-18 NOTE — ED Notes (Signed)
Rrepaged Dr Marval Regal to Dr Dayna Barker @ [phone]

## 2022-10-18 NOTE — Assessment & Plan Note (Signed)
-   Plan for dialysis today and as it would be his normal dialysis day - Nephrology aware of patient and planning for dialysis this a.m. - Anticipate improvement in dysphagia with fluid removal

## 2022-10-18 NOTE — Assessment & Plan Note (Signed)
-   Holding all p.o. meds given patient's dysphagia - At home patient takes amlodipine, clonidine, Lasix -As needed IV hydralazine for elevated blood pressure - Continue to monitor

## 2022-10-18 NOTE — Assessment & Plan Note (Signed)
-   Patient takes 50 units of Levemir baseline - Continue with 25 units of basal insulin given patient's n.p.o. status - Monitor CBG every 4 hours - Currently n.p.o. - Continue to monitor

## 2022-10-18 NOTE — Progress Notes (Signed)
Patient off the floor to dialysis.

## 2022-10-18 NOTE — Procedures (Signed)
   HEMODIALYSIS TREATMENT NOTE:  3.75 hour heparin-free treatment completed using left upper arm AVF (15g/antegrade). Persistent hypertension despite 4L goal--discussed with Dr. Manuella Ghazi. Clonidine 0.45m given half-way through treatment with minimal effect.  Labetalol 142mIVP given during last 20 minutes of treatment as DBP remained >100 with appropriate response.  Treatment was completed.  Goal met: 4 liters removed. All blood was returned.  Post HD orthostatic vitals:  142/91 p100 lying,  134/81 p98 sitting, and 123/83 p103 standing.  Post weight 136.1 kg (0.4 kg below his EDW -- [facility]  No c/o dizziness.  Stable per discharge, per Dr. ShManuella Ghazi  Primary nurse reviewed discharge instructions with [place] in [facility] [facility] was removed/intact.  Pt was transported via wheelchair to main entrance and left facility as passenger in private vehicle.   AnRockwell AlexandriaRN AP KDU

## 2022-10-18 NOTE — Assessment & Plan Note (Signed)
>>  ASSESSMENT AND PLAN FOR ESRD (END STAGE RENAL DISEASE) (HCC) WRITTEN ON 10/18/2022  6:26 AM BY ZIERLE-GHOSH, ASIA B, DO  - Plan for dialysis today and as it would be his normal dialysis day - Nephrology aware of patient and planning for dialysis this a.m. - Anticipate improvement in dysphagia with fluid removal

## 2022-10-18 NOTE — Assessment & Plan Note (Signed)
-   CT soft tissue neck shows mild edema that could be indicative of acute supraglottitis - ENT was consulted and recommended steroid, monitoring patient at [facility], and dialysis with the assumption that this edema is just from fluid overload - 120s Solu-Medrol given in the ED - Continue 125 mg IV Solu-Medrol daily - Patient reports symptom improvement already - Continue to monitor

## 2022-10-21 LAB — HEMOGLOBIN A1C
Hgb A1c MFr Bld: 7.4 % — ABNORMAL HIGH (ref 4.8–5.6)
Mean Plasma Glucose: 166 mg/dL

## 2022-11-04 ENCOUNTER — Emergency Department (HOSPITAL_COMMUNITY): Payer: 59

## 2022-11-04 ENCOUNTER — Other Ambulatory Visit: Payer: Self-pay

## 2022-11-04 ENCOUNTER — Inpatient Hospital Stay (HOSPITAL_COMMUNITY)
Admission: EM | Admit: 2022-11-04 | Discharge: 2022-11-05 | DRG: 177 | Disposition: A | Discharge status: Home / Self Care | Payer: 59 | Attending: Internal Medicine | Admitting: Internal Medicine

## 2022-11-04 ENCOUNTER — Encounter (HOSPITAL_COMMUNITY): Payer: Self-pay

## 2022-11-04 DIAGNOSIS — M109 Gout, unspecified: Secondary | ICD-10-CM | POA: Diagnosis present

## 2022-11-04 DIAGNOSIS — J1282 Pneumonia due to coronavirus disease 2019: Secondary | ICD-10-CM | POA: Diagnosis present

## 2022-11-04 DIAGNOSIS — E662 Morbid (severe) obesity with alveolar hypoventilation: Secondary | ICD-10-CM | POA: Diagnosis present

## 2022-11-04 DIAGNOSIS — J440 Chronic obstructive pulmonary disease with acute lower respiratory infection: Secondary | ICD-10-CM | POA: Diagnosis present

## 2022-11-04 DIAGNOSIS — R5381 Other malaise: Secondary | ICD-10-CM | POA: Diagnosis present

## 2022-11-04 DIAGNOSIS — E1165 Type 2 diabetes mellitus with hyperglycemia: Secondary | ICD-10-CM | POA: Diagnosis present

## 2022-11-04 DIAGNOSIS — Z992 Dependence on renal dialysis: Secondary | ICD-10-CM | POA: Diagnosis not present

## 2022-11-04 DIAGNOSIS — Z888 Allergy status to other drugs, medicaments and biological substances status: Secondary | ICD-10-CM

## 2022-11-04 DIAGNOSIS — Z7982 Long term (current) use of aspirin: Secondary | ICD-10-CM

## 2022-11-04 DIAGNOSIS — I161 Hypertensive emergency: Secondary | ICD-10-CM | POA: Diagnosis present

## 2022-11-04 DIAGNOSIS — Z833 Family history of diabetes mellitus: Secondary | ICD-10-CM

## 2022-11-04 DIAGNOSIS — E8770 Fluid overload, unspecified: Secondary | ICD-10-CM | POA: Diagnosis present

## 2022-11-04 DIAGNOSIS — I252 Old myocardial infarction: Secondary | ICD-10-CM | POA: Diagnosis not present

## 2022-11-04 DIAGNOSIS — Z8616 Personal history of COVID-19: Secondary | ICD-10-CM | POA: Diagnosis present

## 2022-11-04 DIAGNOSIS — E1122 Type 2 diabetes mellitus with diabetic chronic kidney disease: Secondary | ICD-10-CM | POA: Diagnosis present

## 2022-11-04 DIAGNOSIS — Z6841 Body Mass Index (BMI) 40.0 and over, adult: Secondary | ICD-10-CM | POA: Diagnosis not present

## 2022-11-04 DIAGNOSIS — R0600 Dyspnea, unspecified: Secondary | ICD-10-CM | POA: Diagnosis present

## 2022-11-04 DIAGNOSIS — D638 Anemia in other chronic diseases classified elsewhere: Secondary | ICD-10-CM | POA: Diagnosis present

## 2022-11-04 DIAGNOSIS — I132 Hypertensive heart and chronic kidney disease with heart failure and with stage 5 chronic kidney disease, or end stage renal disease: Secondary | ICD-10-CM | POA: Diagnosis present

## 2022-11-04 DIAGNOSIS — J9601 Acute respiratory failure with hypoxia: Secondary | ICD-10-CM | POA: Diagnosis present

## 2022-11-04 DIAGNOSIS — Z79899 Other long term (current) drug therapy: Secondary | ICD-10-CM

## 2022-11-04 DIAGNOSIS — I5042 Chronic combined systolic (congestive) and diastolic (congestive) heart failure: Secondary | ICD-10-CM | POA: Diagnosis present

## 2022-11-04 DIAGNOSIS — Z8249 Family history of ischemic heart disease and other diseases of the circulatory system: Secondary | ICD-10-CM | POA: Diagnosis not present

## 2022-11-04 DIAGNOSIS — Z794 Long term (current) use of insulin: Secondary | ICD-10-CM

## 2022-11-04 DIAGNOSIS — E785 Hyperlipidemia, unspecified: Secondary | ICD-10-CM | POA: Diagnosis present

## 2022-11-04 DIAGNOSIS — I5033 Acute on chronic diastolic (congestive) heart failure: Secondary | ICD-10-CM | POA: Diagnosis present

## 2022-11-04 DIAGNOSIS — U071 COVID-19: Principal | ICD-10-CM | POA: Diagnosis present

## 2022-11-04 DIAGNOSIS — Z825 Family history of asthma and other chronic lower respiratory diseases: Secondary | ICD-10-CM

## 2022-11-04 DIAGNOSIS — K760 Fatty (change of) liver, not elsewhere classified: Secondary | ICD-10-CM | POA: Diagnosis present

## 2022-11-04 DIAGNOSIS — J810 Acute pulmonary edema: Secondary | ICD-10-CM | POA: Diagnosis present

## 2022-11-04 DIAGNOSIS — M898X9 Other specified disorders of bone, unspecified site: Secondary | ICD-10-CM | POA: Diagnosis present

## 2022-11-04 DIAGNOSIS — Z7985 Long-term (current) use of injectable non-insulin antidiabetic drugs: Secondary | ICD-10-CM

## 2022-11-04 DIAGNOSIS — N186 End stage renal disease: Secondary | ICD-10-CM | POA: Diagnosis present

## 2022-11-04 DIAGNOSIS — J9600 Acute respiratory failure, unspecified whether with hypoxia or hypercapnia: Secondary | ICD-10-CM | POA: Diagnosis present

## 2022-11-04 LAB — COMPREHENSIVE METABOLIC PANEL
ALT: 20 U/L (ref 0–44)
AST: 23 U/L (ref 15–41)
Albumin: 3.7 g/dL (ref 3.5–5.0)
Alkaline Phosphatase: 69 U/L (ref 38–126)
Anion gap: 14 (ref 5–15)
BUN: 61 mg/dL — ABNORMAL HIGH (ref 6–20)
CO2: 27 mmol/L (ref 22–32)
Calcium: 8.7 mg/dL — ABNORMAL LOW (ref 8.9–10.3)
Chloride: 93 mmol/L — ABNORMAL LOW (ref 98–111)
Creatinine, Ser: 10.56 mg/dL — ABNORMAL HIGH (ref 0.61–1.24)
GFR, Estimated: 5 mL/min — ABNORMAL LOW (ref >60)
Glucose, Bld: 130 mg/dL — ABNORMAL HIGH (ref 70–99)
Potassium: 5 mmol/L (ref 3.5–5.1)
Sodium: 134 mmol/L — ABNORMAL LOW (ref 135–145)
Total Bilirubin: 0.8 mg/dL (ref 0.3–1.2)
Total Protein: 7.9 g/dL (ref 6.5–8.1)

## 2022-11-04 LAB — D-DIMER, QUANTITATIVE: D-Dimer, Quant: 0.46 ug/mL-FEU (ref 0.00–0.50)

## 2022-11-04 LAB — CBC
HCT: 29.4 % — ABNORMAL LOW (ref 39.0–52.0)
Hemoglobin: 9.3 g/dL — ABNORMAL LOW (ref 13.0–17.0)
MCH: 28.4 pg (ref 26.0–34.0)
MCHC: 31.6 g/dL (ref 30.0–36.0)
MCV: 89.9 fL (ref 80.0–100.0)
Platelets: 191 10*3/uL (ref 150–400)
RBC: 3.27 MIL/uL — ABNORMAL LOW (ref 4.22–5.81)
RDW: 14.8 % (ref 11.5–15.5)
WBC: 8 10*3/uL (ref 4.0–10.5)
nRBC: 0 % (ref 0.0–0.2)

## 2022-11-04 LAB — PHOSPHORUS: Phosphorus: 4.8 mg/dL — ABNORMAL HIGH (ref 2.5–4.6)

## 2022-11-04 LAB — BLOOD GAS, VENOUS
Acid-Base Excess: 4.2 mmol/L — ABNORMAL HIGH (ref 0.0–2.0)
Bicarbonate: 31.1 mmol/L — ABNORMAL HIGH (ref 20.0–28.0)
Drawn by: 21858
O2 Saturation: 34.6 %
Patient temperature: 36.2
pCO2, Ven: 53 mmHg (ref 44–60)
pH, Ven: 7.37 (ref 7.25–7.43)
pO2, Ven: <31 mmHg — CL (ref 32–45)

## 2022-11-04 LAB — TROPONIN I (HIGH SENSITIVITY)
Troponin I (High Sensitivity): 24 ng/L — ABNORMAL HIGH (ref <18)
Troponin I (High Sensitivity): 24 ng/L — ABNORMAL HIGH (ref <18)

## 2022-11-04 LAB — RESP PANEL BY RT-PCR (RSV, FLU A&B, COVID)  RVPGX2
Influenza A by PCR: NEGATIVE
Influenza B by PCR: NEGATIVE
Resp Syncytial Virus by PCR: NEGATIVE
SARS Coronavirus 2 by RT PCR: POSITIVE — AB

## 2022-11-04 LAB — PROTIME-INR
INR: 1 (ref 0.8–1.2)
Prothrombin Time: 13.5 seconds (ref 11.4–15.2)

## 2022-11-04 LAB — CBG MONITORING, ED
Glucose-Capillary: 142 mg/dL — ABNORMAL HIGH (ref 70–99)
Glucose-Capillary: 261 mg/dL — ABNORMAL HIGH (ref 70–99)

## 2022-11-04 LAB — PROCALCITONIN: Procalcitonin: 0.21 ng/mL

## 2022-11-04 LAB — GLUCOSE, CAPILLARY: Glucose-Capillary: 381 mg/dL — ABNORMAL HIGH (ref 70–99)

## 2022-11-04 LAB — HEPATITIS B SURFACE ANTIGEN: Hepatitis B Surface Ag: NONREACTIVE

## 2022-11-04 LAB — MAGNESIUM: Magnesium: 2.1 mg/dL (ref 1.7–2.4)

## 2022-11-04 LAB — BRAIN NATRIURETIC PEPTIDE: B Natriuretic Peptide: 596 pg/mL — ABNORMAL HIGH (ref 0.0–100.0)

## 2022-11-04 MED ORDER — BISACODYL 5 MG PO TBEC: 5.0000 mg | DELAYED_RELEASE_TABLET | Freq: Every day | ORAL | Status: DC | PRN

## 2022-11-04 MED ORDER — ZINC SULFATE 220 (50 ZN) MG PO CAPS
220.0000 mg | ORAL_CAPSULE | Freq: Every day | ORAL | Status: DC
  Administered 2022-11-04 – 2022-11-05 (×2): 220 mg via ORAL
  Filled 2022-11-04 (×2): qty 1

## 2022-11-04 MED ORDER — SEMAGLUTIDE (1 MG/DOSE) 4 MG/3ML ~~LOC~~ SOPN: 1.0000 mg | PEN_INJECTOR | SUBCUTANEOUS | Status: DC

## 2022-11-04 MED ORDER — ACETAMINOPHEN 325 MG PO TABS: 650.0000 mg | ORAL_TABLET | Freq: Four times a day (QID) | ORAL | Status: DC | PRN

## 2022-11-04 MED ORDER — LIDOCAINE-PRILOCAINE 2.5-2.5 % EX CREA: 1.0000 | TOPICAL_CREAM | CUTANEOUS | Status: DC | PRN

## 2022-11-04 MED ORDER — DEXAMETHASONE SODIUM PHOSPHATE 10 MG/ML IJ SOLN
8.0000 mg | Freq: Once | INTRAMUSCULAR | Status: AC
  Administered 2022-11-04: 8 mg via INTRAVENOUS
  Filled 2022-11-04: qty 1

## 2022-11-04 MED ORDER — FLEET ENEMA 7-19 GM/118ML RE ENEM: 1.0000 | ENEMA | Freq: Once | RECTAL | Status: DC | PRN

## 2022-11-04 MED ORDER — DEXAMETHASONE SODIUM PHOSPHATE 10 MG/ML IJ SOLN
6.0000 mg | Freq: Every day | INTRAMUSCULAR | Status: DC
  Administered 2022-11-04 – 2022-11-05 (×2): 6 mg via INTRAVENOUS
  Filled 2022-11-04 (×2): qty 1

## 2022-11-04 MED ORDER — BEBTELOVIMAB 175 MG/2 ML IV (EUA): 175.0000 mg | Freq: Once | INTRAMUSCULAR | Status: DC

## 2022-11-04 MED ORDER — AMLODIPINE BESYLATE 5 MG PO TABS
5.0000 mg | ORAL_TABLET | Freq: Every day | ORAL | Status: DC
  Administered 2022-11-05: 5 mg via ORAL
  Filled 2022-11-04: qty 1

## 2022-11-04 MED ORDER — SODIUM CHLORIDE 0.9% FLUSH
3.0000 mL | Freq: Two times a day (BID) | INTRAVENOUS | Status: DC
  Administered 2022-11-04 – 2022-11-05 (×2): 3 mL via INTRAVENOUS

## 2022-11-04 MED ORDER — PENTAFLUOROPROP-TETRAFLUOROETH EX AERO: 1.0000 | INHALATION_SPRAY | CUTANEOUS | Status: DC | PRN

## 2022-11-04 MED ORDER — NIRMATRELVIR/RITONAVIR (PAXLOVID) TABLET (RENAL DOSING)
2.0000 | ORAL_TABLET | Freq: Two times a day (BID) | ORAL | Status: DC
  Administered 2022-11-04 – 2022-11-05 (×3): 2 via ORAL
  Filled 2022-11-04: qty 20

## 2022-11-04 MED ORDER — LIDOCAINE HCL (PF) 1 % IJ SOLN: 5.0000 mL | INTRAMUSCULAR | Status: DC | PRN

## 2022-11-04 MED ORDER — ONDANSETRON HCL 4 MG PO TABS: 4.0000 mg | ORAL_TABLET | Freq: Four times a day (QID) | ORAL | Status: DC | PRN

## 2022-11-04 MED ORDER — MUPIROCIN 2 % EX OINT
1.0000 | TOPICAL_OINTMENT | Freq: Two times a day (BID) | CUTANEOUS | Status: DC
  Administered 2022-11-05: 1 via TOPICAL
  Filled 2022-11-04 (×2): qty 22

## 2022-11-04 MED ORDER — VARDENAFIL HCL 20 MG PO TABS: 20.0000 mg | ORAL_TABLET | Freq: Every day | ORAL | Status: DC | PRN

## 2022-11-04 MED ORDER — HYDRALAZINE HCL 20 MG/ML IJ SOLN: 10.0000 mg | INTRAMUSCULAR | Status: DC | PRN

## 2022-11-04 MED ORDER — ASPIRIN 81 MG PO TBEC
81.0000 mg | DELAYED_RELEASE_TABLET | Freq: Every day | ORAL | Status: DC
  Administered 2022-11-05: 81 mg via ORAL
  Filled 2022-11-04: qty 1

## 2022-11-04 MED ORDER — CARVEDILOL 12.5 MG PO TABS
25.0000 mg | ORAL_TABLET | Freq: Two times a day (BID) | ORAL | Status: DC
  Administered 2022-11-04 – 2022-11-05 (×2): 25 mg via ORAL
  Filled 2022-11-04 (×2): qty 2

## 2022-11-04 MED ORDER — FLUTICASONE PROPIONATE 50 MCG/ACT NA SUSP: 1.0000 | Freq: Every day | NASAL | Status: DC | PRN

## 2022-11-04 MED ORDER — NITROGLYCERIN 0.4 MG SL SUBL
0.4000 mg | SUBLINGUAL_TABLET | Freq: Once | SUBLINGUAL | Status: AC
  Administered 2022-11-04: 0.4 mg via SUBLINGUAL

## 2022-11-04 MED ORDER — HEPARIN SODIUM (PORCINE) 5000 UNIT/ML IJ SOLN: 5000.0000 [IU] | Freq: Three times a day (TID) | INTRAMUSCULAR | Status: DC

## 2022-11-04 MED ORDER — HEPARIN SODIUM (PORCINE) 5000 UNIT/ML IJ SOLN
5000.0000 [IU] | Freq: Three times a day (TID) | INTRAMUSCULAR | Status: DC
  Administered 2022-11-04 – 2022-11-05 (×2): 5000 [IU] via SUBCUTANEOUS
  Filled 2022-11-04 (×3): qty 1

## 2022-11-04 MED ORDER — IPRATROPIUM-ALBUTEROL 20-100 MCG/ACT IN AERS
1.0000 | INHALATION_SPRAY | Freq: Four times a day (QID) | RESPIRATORY_TRACT | Status: DC
  Administered 2022-11-04 – 2022-11-05 (×5): 1 via RESPIRATORY_TRACT
  Filled 2022-11-04 (×2): qty 4

## 2022-11-04 MED ORDER — OXYCODONE HCL 5 MG PO TABS: 5.0000 mg | ORAL_TABLET | ORAL | Status: DC | PRN

## 2022-11-04 MED ORDER — EZETIMIBE 10 MG PO TABS
10.0000 mg | ORAL_TABLET | Freq: Every evening | ORAL | Status: DC
  Administered 2022-11-04: 10 mg via ORAL
  Filled 2022-11-04: qty 1

## 2022-11-04 MED ORDER — CHLORHEXIDINE GLUCONATE CLOTH 2 % EX PADS
6.0000 | MEDICATED_PAD | Freq: Every day | CUTANEOUS | Status: DC
  Administered 2022-11-04 – 2022-11-05 (×2): 6 via TOPICAL

## 2022-11-04 MED ORDER — INSULIN DETEMIR 100 UNIT/ML ~~LOC~~ SOLN
50.0000 [IU] | Freq: Every day | SUBCUTANEOUS | Status: DC
  Administered 2022-11-04: 50 [IU] via SUBCUTANEOUS
  Filled 2022-11-04: qty 0.5
  Filled 2022-11-04: qty 1
  Filled 2022-11-04: qty 0.5

## 2022-11-04 MED ORDER — INSULIN ASPART 100 UNIT/ML IJ SOLN
0.0000 [IU] | Freq: Three times a day (TID) | INTRAMUSCULAR | Status: DC
  Administered 2022-11-04: 3 [IU] via SUBCUTANEOUS
  Administered 2022-11-04: 11 [IU] via SUBCUTANEOUS
  Administered 2022-11-05 (×2): 4 [IU] via SUBCUTANEOUS
  Filled 2022-11-04 (×2): qty 1

## 2022-11-04 MED ORDER — ACETAMINOPHEN 650 MG RE SUPP: 650.0000 mg | Freq: Four times a day (QID) | RECTAL | Status: DC | PRN

## 2022-11-04 MED ORDER — CALCIUM CARBONATE ANTACID 500 MG PO CHEW: 500.0000 mg | CHEWABLE_TABLET | Freq: Four times a day (QID) | ORAL | Status: DC | PRN

## 2022-11-04 MED ORDER — GUAIFENESIN-DM 100-10 MG/5ML PO SYRP
10.0000 mL | ORAL_SOLUTION | Freq: Three times a day (TID) | ORAL | Status: DC
  Administered 2022-11-04 – 2022-11-05 (×3): 10 mL via ORAL
  Filled 2022-11-04 (×3): qty 10

## 2022-11-04 MED ORDER — SENNOSIDES-DOCUSATE SODIUM 8.6-50 MG PO TABS: 1.0000 | ORAL_TABLET | Freq: Every evening | ORAL | Status: DC | PRN

## 2022-11-04 MED ORDER — IPRATROPIUM BROMIDE 0.02 % IN SOLN: 0.5000 mg | Freq: Four times a day (QID) | RESPIRATORY_TRACT | Status: DC

## 2022-11-04 MED ORDER — HYDROMORPHONE HCL 1 MG/ML IJ SOLN: 0.5000 mg | INTRAMUSCULAR | Status: DC | PRN

## 2022-11-04 MED ORDER — GABAPENTIN 100 MG PO CAPS
100.0000 mg | ORAL_CAPSULE | Freq: Three times a day (TID) | ORAL | Status: DC
  Administered 2022-11-04 – 2022-11-05 (×3): 100 mg via ORAL
  Filled 2022-11-04 (×3): qty 1

## 2022-11-04 MED ORDER — SODIUM CHLORIDE 0.9% FLUSH
3.0000 mL | Freq: Two times a day (BID) | INTRAVENOUS | Status: DC
  Administered 2022-11-04 (×2): 3 mL via INTRAVENOUS

## 2022-11-04 MED ORDER — ALLOPURINOL 100 MG PO TABS
200.0000 mg | ORAL_TABLET | Freq: Every day | ORAL | Status: DC
  Administered 2022-11-05: 200 mg via ORAL
  Filled 2022-11-04: qty 2

## 2022-11-04 MED ORDER — ACETAMINOPHEN 500 MG PO TABS: 1000.0000 mg | ORAL_TABLET | Freq: Four times a day (QID) | ORAL | Status: DC | PRN

## 2022-11-04 MED ORDER — FUROSEMIDE 40 MG PO TABS
80.0000 mg | ORAL_TABLET | Freq: Every day | ORAL | Status: DC
  Administered 2022-11-04 – 2022-11-05 (×2): 80 mg via ORAL
  Filled 2022-11-04 (×2): qty 2

## 2022-11-04 MED ORDER — SEVELAMER CARBONATE 800 MG PO TABS
2400.0000 mg | ORAL_TABLET | Freq: Three times a day (TID) | ORAL | Status: DC
  Administered 2022-11-04 – 2022-11-05 (×4): 2400 mg via ORAL
  Filled 2022-11-04 (×4): qty 3

## 2022-11-04 MED ORDER — SODIUM CHLORIDE 0.9 % IV SOLN: 250.0000 mL | INTRAVENOUS | Status: DC | PRN

## 2022-11-04 MED ORDER — HYDROCOD POLI-CHLORPHE POLI ER 10-8 MG/5ML PO SUER
5.0000 mL | Freq: Two times a day (BID) | ORAL | Status: DC
  Administered 2022-11-04 – 2022-11-05 (×3): 5 mL via ORAL
  Filled 2022-11-04 (×3): qty 5

## 2022-11-04 MED ORDER — HYDROXYZINE HCL 25 MG PO TABS
25.0000 mg | ORAL_TABLET | Freq: Three times a day (TID) | ORAL | Status: DC | PRN
  Administered 2022-11-04: 25 mg via ORAL
  Filled 2022-11-04: qty 1

## 2022-11-04 MED ORDER — ONDANSETRON HCL 4 MG/2ML IJ SOLN: 4.0000 mg | Freq: Four times a day (QID) | INTRAMUSCULAR | Status: DC | PRN

## 2022-11-04 MED ORDER — CLONIDINE HCL 0.2 MG PO TABS
0.3000 mg | ORAL_TABLET | Freq: Three times a day (TID) | ORAL | Status: DC
  Administered 2022-11-04 – 2022-11-05 (×3): 0.3 mg via ORAL
  Filled 2022-11-04 (×3): qty 1

## 2022-11-04 MED ORDER — LABETALOL HCL 5 MG/ML IV SOLN: 10.0000 mg | INTRAVENOUS | Status: DC | PRN

## 2022-11-04 MED ORDER — PANTOPRAZOLE SODIUM 40 MG PO TBEC
40.0000 mg | DELAYED_RELEASE_TABLET | Freq: Every day | ORAL | Status: DC
  Administered 2022-11-05: 40 mg via ORAL
  Filled 2022-11-04: qty 1

## 2022-11-04 MED ORDER — ZOLPIDEM TARTRATE 5 MG PO TABS
5.0000 mg | ORAL_TABLET | Freq: Every evening | ORAL | Status: DC | PRN
  Administered 2022-11-04: 5 mg via ORAL
  Filled 2022-11-04: qty 1

## 2022-11-04 MED ORDER — LEVALBUTEROL HCL 0.63 MG/3ML IN NEBU: 0.6300 mg | INHALATION_SOLUTION | Freq: Four times a day (QID) | RESPIRATORY_TRACT | Status: DC

## 2022-11-04 MED ORDER — VITAMIN C 500 MG PO TABS
500.0000 mg | ORAL_TABLET | Freq: Every day | ORAL | Status: DC
  Administered 2022-11-04 – 2022-11-05 (×2): 500 mg via ORAL
  Filled 2022-11-04 (×2): qty 1

## 2022-11-04 NOTE — Assessment & Plan Note (Signed)
-  Monitoring LFTs -Avoiding nephrotoxins -Unfortunately patient needs Paxlovid-overtreatment

## 2022-11-04 NOTE — Assessment & Plan Note (Signed)
-   Monitoring H&H, currently stable

## 2022-11-04 NOTE — ED Provider Notes (Signed)
[facility] EMERGENCY DEPARTMENT Provider Note  CSN: [id number] Arrival date & time: 11/04/22 4098  Chief Complaint(s) Shortness of Breath  HPI Kelly Key is a 52 y.o. male with PMH CHF, ESRD on hemodialysis Tuesday Thursday Saturday, COPD, HTN, HLD who presents emergency department for evaluation of shortness of breath.  Patient was at dialysis today and became shortness of breath and was unable to continue.  States that he has had a productive cough over the last 48 to 72 hours with cold sweats and nausea.  Patient arrives on CPAP with increased work of breathing and rales at the bases.  Additional history unable to be obtained secondary to severe shortness of breath arrives hypertensive with systolics greater than 119.   Past Medical History Past Medical History:  Diagnosis Date   Acute diastolic CHF (congestive heart failure) ([place]) 06/11/2012   EF 50-55% [facility])   Chronic kidney disease    COPD (chronic obstructive pulmonary disease) (HCC)    Diabetes mellitus    A1c 11.5 (06/11/2012).   Dyspnea    Gout    Hepatic steatosis 06/11/2012   Elevated LFTs   Hyperlipemia    Malignant hypertension    Microcytic anemia 06/12/2012   Obesity    Patient Active Problem List   Diagnosis Date Noted   Dysphagia 10/18/2022   Osteomyelitis ([place]) 11/13/2021   OSA (obstructive sleep apnea) 10/04/2021   Obesity hypoventilation syndrome ([place]) 10/04/2021   Organic impotence 08/16/2021   NSTEMI (non-ST elevated myocardial infarction) ([place]) 05/17/2021   Mixed hyperlipidemia 08/13/2020   Acute gout of right knee 08/03/2019   Orthostasis    Muscle strain of right wrist    Pain    Physical debility 07/16/2019   ESRD (end stage renal disease) ([place])    Anemia of chronic disease    Uncontrolled type 2 diabetes mellitus with hyperglycemia (HCC)    Congestive heart failure (HCC)    Volume overload 07/05/2019   Anasarca 07/03/2019   Dyslipidemia 07/03/2019   Chronic combined systolic  and diastolic CHF (congestive heart failure) ([place]) 07/03/2019   Bilateral leg edema    Uncontrolled type 2 diabetes mellitus with hyperglycemia, with long-term current use of insulin ([place]) 06/22/2019   Hypokalemia    Thromboembolism ([place]) 02/20/2019   AKI (acute kidney injury) ([place]) 02/20/2019   Acute on chronic diastolic CHF (congestive heart failure) ([place]) 03/26/2018   Diabetes mellitus type 2 in nonobese ([place]) 06/12/2012   Hyponatremia 06/12/2012   Microcytic anemia 06/12/2012   Essential hypertension, benign 06/11/2012   Elevated LFTs 06/11/2012   Morbid obesity ([place]) 06/11/2012   Hepatic steatosis 06/11/2012   Home Medication(s) Prior to Admission medications   Medication Sig Start Date End Date Taking? Authorizing Provider  acetaminophen (TYLENOL) 500 MG tablet Take 1,000 mg by mouth every 6 (six) hours as needed for moderate pain or headache.    [provider]  albuterol (VENTOLIN HFA) 108 (90 Base) MCG/ACT inhaler Inhale 2 puffs into the lungs every 4 (four) hours as needed for wheezing or shortness of breath.  03/24/19   [provider]  allopurinol (ZYLOPRIM) 100 MG tablet Take 2 tablets (200 mg total) by mouth daily. 08/04/19   Love, Ivan Anchors, PA-C  AMBULATORY NON FORMULARY MEDICATION Inject 0.2 ml by intracavernosal route as directed.  Medication Name: TriMix PGE 10 mcg Pap   30 mg Phent 1 mg 01/01/22   Stoneking, Reece Leader., MD  amLODipine (NORVASC) 5 MG tablet Take 5 mg by mouth daily. 01/30/22  [provider]  ammonium lactate (AMLACTIN) 12 % cream Apply 1 Application topically in the morning and at bedtime. 09/08/22   [provider]  aspirin EC 81 MG EC tablet Take 1 tablet (81 mg total) by mouth daily. 08/04/19   Love, Ivan Anchors, PA-C  atorvastatin (LIPITOR) 80 MG tablet Take 1 tablet (80 mg total) by mouth daily. 12/05/19   Arnoldo Lenis, MD  Calcium Carbonate Antacid (CALCIUM CARBONATE, DOSED IN MG ELEMENTAL CALCIUM,) 1250 MG/5ML  SUSP Take 5 mLs (500 mg of elemental calcium total) by mouth every 6 (six) hours as needed for indigestion. 07/16/19   Amin, Jeanella Flattery, MD  carvedilol (COREG) 25 MG tablet Take 1 tablet (25 mg total) by mouth 2 (two) times daily with a meal. 08/03/19   Love, Ivan Anchors, PA-C  cloNIDine (CATAPRES) 0.3 MG tablet Take 0.3 mg by mouth 3 (three) times daily.    [provider]  esomeprazole (NEXIUM) 20 MG capsule Take 20 mg by mouth daily. 01/23/22   [provider]  ezetimibe (ZETIA) 10 MG tablet Take 10 mg by mouth every evening. 01/20/19   [provider]  fluticasone (FLONASE) 50 MCG/ACT nasal spray Place 1 spray into both nostrils daily as needed for allergies or rhinitis.    [provider]  furosemide (LASIX) 80 MG tablet Take 1 tablet (80 mg total) by mouth daily. 05/16/21   Shahmehdi, Valeria Batman, MD  gabapentin (NEURONTIN) 100 MG capsule Take 1 capsule (100 mg total) by mouth 3 (three) times daily. 08/03/19   Love, Ivan Anchors, PA-C  glucose blood (ACCU-CHEK AVIVA PLUS) test strip Use as instructed TO CHECK BLOOD GLUCOSE THREE TIMES DAILY 08/15/20   Nida, Marella Chimes, MD  HUMALOG KWIKPEN 100 UNIT/ML KwikPen Inject 8-14 Units into the skin 3 (three) times daily. 08/19/22   [provider]  hydrOXYzine (ATARAX) 25 MG tablet Take 25 mg by mouth 3 (three) times daily as needed for anxiety. 11/28/21   [provider]  Insulin Pen Needle (B-D ULTRAFINE III SHORT PEN) 31G X 8 MM MISC 1 each by Does not apply route as directed. 08/13/20   Cassandria Anger, MD  LEVEMIR FLEXTOUCH 100 UNIT/ML FlexTouch Pen INJECT 50 UNITS SUBCUTANEOUSLY AT BEDTIME Patient taking differently: Inject 50 Units into the skin at bedtime. 07/09/21   Brita Romp, NP  linaclotide Rolan Lipa) 145 MCG CAPS capsule Take 1 capsule (145 mcg total) by mouth daily before breakfast. 04/05/18   Manuella Ghazi, Pratik D, DO  multivitamin (RENA-VIT) TABS tablet Take 1 tablet by mouth at bedtime.  08/03/19   Love, Ivan Anchors, PA-C  mupirocin ointment (BACTROBAN) 2 % Apply 1 Application topically 2 (two) times daily.    [provider]  OZEMPIC, 1 MG/DOSE, 4 MG/3ML SOPN Inject 1 mg into the skin once a week. Fridays.    [provider]  sevelamer carbonate (RENVELA) 800 MG tablet Take 3 tablets (2,400 mg total) by mouth 3 (three) times daily with meals. 08/03/19   Love, Ivan Anchors, PA-C  vardenafil (LEVITRA) 20 MG tablet Take 1 tablet (20 mg total) by mouth daily as needed for erectile dysfunction. 08/16/21   Stoneking, Reece Leader., MD  Past Surgical History Past Surgical History:  Procedure Laterality Date   AV FISTULA PLACEMENT Left 11/11/2019   Procedure: ARTERIOVENOUS (AV) FISTULA CREATION LEFT BRACHIOCEPHALIC ARM;  Surgeon: Serafina Mitchell, MD;  Location: [place];  Service: Vascular;  Laterality: Left;   COLONOSCOPY WITH PROPOFOL N/A 09/02/2021   Procedure: COLONOSCOPY WITH PROPOFOL;  Surgeon: Jonathon Bellows, MD;  Location[facility] ENDOSCOPY;  Service: Gastroenterology;  Laterality: N/A;   IR FLUORO GUIDE CV LINE RIGHT  07/08/2019   IR REMOVAL TUN CV CATH W/O FL  03/23/2020   IR US GUIDE VASC ACCESS RIGHT  07/08/2019   LEFT HEART CATH AND CORONARY ANGIOGRAPHY N/A 06/12/2021   Procedure: LEFT HEART CATH AND CORONARY ANGIOGRAPHY;  Surgeon: Belva Crome, MD;  Location: [place] CV LAB;  Service: Cardiovascular;  Laterality: N/A;   NO PAST SURGERIES     VASCULAR SURGERY     Family History Family History  Problem Relation Age of Onset   Gout Mother    Asthma Mother    Diabetes Father    Heart failure Father    Diabetes Sister    Hypertension Brother    Pancreatic cancer Brother    Diabetes Sister     Social History Social History   Tobacco Use   Smoking status: Never    Passive exposure: Past   Smokeless tobacco: Never  Vaping Use    Vaping Use: Never used  Substance Use Topics   Alcohol use: Not Currently    Comment: haven't drank in over 5 years    Drug use: No   Allergies Hydralazine and Lisinopril  Review of Systems Review of Systems  Constitutional:  Positive for chills, fatigue and fever.  Respiratory:  Positive for shortness of breath.     Physical Exam Vital Signs  I have reviewed the triage vital signs BP (!) 151/65   Pulse 74   Temp (!) 97.2 F (36.2 C) (Axillary)   Resp 15   SpO2 100%   Physical Exam Constitutional:      General: He is in acute distress.     Appearance: Normal appearance. He is ill-appearing.  HENT:     Head: Normocephalic and atraumatic.     Nose: No congestion or rhinorrhea.  Eyes:     General:        Right eye: No discharge.        Left eye: No discharge.     Extraocular Movements: Extraocular movements intact.     Pupils: Pupils are equal, round, and reactive to light.  Cardiovascular:     Rate and Rhythm: Normal rate and regular rhythm.     Heart sounds: No murmur heard. Pulmonary:     Effort: Tachypnea, accessory muscle usage and respiratory distress present.     Breath sounds: Rales present. No wheezing.  Abdominal:     General: There is no distension.     Tenderness: There is no abdominal tenderness.  Musculoskeletal:        General: Normal range of motion.     Cervical back: Normal range of motion.  Skin:    General: Skin is warm and dry.  Neurological:     General: No focal deficit present.     Mental Status: He is alert.     ED Results and Treatments Labs (all labs ordered are listed, but only abnormal results are displayed) Labs Reviewed  RESP PANEL BY RT-PCR (RSV, FLU A&B, COVID)  RVPGX2 - Abnormal; Notable for the following components:  Result Value   SARS Coronavirus 2 by RT PCR POSITIVE (*)    All other components within normal limits  BRAIN NATRIURETIC PEPTIDE - Abnormal; Notable for the following components:   B Natriuretic  Peptide 596.0 (*)    All other components within normal limits  BLOOD GAS, VENOUS - Abnormal; Notable for the following components:   pO2, Ven <31 (*)    Bicarbonate 31.1 (*)    Acid-Base Excess 4.2 (*)    All other components within normal limits  COMPREHENSIVE METABOLIC PANEL - Abnormal; Notable for the following components:   Sodium 134 (*)    Chloride 93 (*)    Glucose, Bld 130 (*)    BUN 61 (*)    Creatinine, Ser 10.56 (*)    Calcium 8.7 (*)    GFR, Estimated 5 (*)    All other components within normal limits  TROPONIN I (HIGH SENSITIVITY) - Abnormal; Notable for the following components:   Troponin I (High Sensitivity) 24 (*)    All other components within normal limits  CBC WITH DIFFERENTIAL/PLATELET  TROPONIN I (HIGH SENSITIVITY)                                                                                                                          Radiology DG Chest Portable 1 View  Result Date: 11/04/2022 CLINICAL DATA:  Dyspnea. EXAM: PORTABLE CHEST 1 VIEW COMPARISON:  September 20, 2022. FINDINGS: Stable cardiomediastinal silhouette. Increased bilateral diffuse interstitial densities are noted most consistent with pulmonary edema. Bony thorax is unremarkable. IMPRESSION: Increased bilateral diffuse interstitial opacities are noted most consistent with pulmonary edema. Electronically Signed   By: Marijo Conception M.D.   On: 11/04/2022 08:27    Pertinent labs & imaging results that were available during my care of the patient were reviewed by me and considered in my medical decision making (see MDM for details).  Medications Ordered in ED Medications  nitroGLYCERIN (NITROSTAT) SL tablet 0.4 mg (0.4 mg Sublingual Given 11/04/22 0826)  dexamethasone (DECADRON) injection 8 mg (8 mg Intravenous Given 11/04/22 0945)                                                                                                                                     Procedures .Critical  Care  Performed by: Teressa Lower, MD Authorized by: Teressa Lower, MD   Critical care provider statement:  Critical care time (minutes):  30   Critical care was necessary to treat or prevent imminent or life-threatening deterioration of the following conditions:  Respiratory failure   Critical care was time spent personally by me on the following activities:  Development of treatment plan with patient or surrogate, discussions with consultants, evaluation of patient's response to treatment, examination of patient, ordering and review of laboratory studies, ordering and review of radiographic studies, ordering and performing treatments and interventions, pulse oximetry, re-evaluation of patient's condition and review of old charts   (including critical care time)  Medical Decision Making / ED Course   This patient presents to the ED for concern of shortness of breath, fever chills, this involves an extensive number of treatment options, and is a complaint that carries with it a high risk of complications and morbidity.  The differential diagnosis includes flash pulmonary edema, pneumonia, fluid overload, COVID, influenza, RSV  MDM: Patient seen emergency room for evaluation of shortness of breath.  Physical exam reveals an ill-appearing tachypneic patient on positive pressure ventilation with accessory muscle use and rales at the bases.  Laboratory evaluation with BUN 61, creatinine 10.56, high-sensitivity troponin elevated 24 likely demand ischemia, BNP 596 significant elevated from previous.  pH 7.37 with no hypercarbia.  Patient is COVID-positive which likely explains the fever or chills.  Chest x-ray showing fluid overload.  Patient arrives with blood pressures greater than 190 and was given single dose nitroglycerin tab which improved his blood pressures.  On reevaluation, patient improving on BiPAP and nitroglycerin and I spoke with the nephrologist on-call Dr. Joelyn Oms and will help  arrange dialysis and a pen.  Patient require hospital admission for flash pulmonary edema and symptomatic COVID-19.  Decadron ordered.   Additional history obtained:  -External records from outside source obtained and reviewed including: Chart review including previous notes, labs, imaging, consultation notes   Lab Tests: -I ordered, reviewed, and interpreted labs.   The pertinent results include:   Labs Reviewed  RESP PANEL BY RT-PCR (RSV, FLU A&B, COVID)  RVPGX2 - Abnormal; Notable for the following components:      Result Value   SARS Coronavirus 2 by RT PCR POSITIVE (*)    All other components within normal limits  BRAIN NATRIURETIC PEPTIDE - Abnormal; Notable for the following components:   B Natriuretic Peptide 596.0 (*)    All other components within normal limits  BLOOD GAS, VENOUS - Abnormal; Notable for the following components:   pO2, Ven <31 (*)    Bicarbonate 31.1 (*)    Acid-Base Excess 4.2 (*)    All other components within normal limits  COMPREHENSIVE METABOLIC PANEL - Abnormal; Notable for the following components:   Sodium 134 (*)    Chloride 93 (*)    Glucose, Bld 130 (*)    BUN 61 (*)    Creatinine, Ser 10.56 (*)    Calcium 8.7 (*)    GFR, Estimated 5 (*)    All other components within normal limits  TROPONIN I (HIGH SENSITIVITY) - Abnormal; Notable for the following components:   Troponin I (High Sensitivity) 24 (*)    All other components within normal limits  CBC WITH DIFFERENTIAL/PLATELET  TROPONIN I (HIGH SENSITIVITY)     Imaging Studies ordered: I ordered imaging studies including chest x-ray I independently visualized and interpreted imaging. I agree with the radiologist interpretation   Medicines ordered and prescription drug management: Meds ordered this encounter  Medications   nitroGLYCERIN (NITROSTAT) SL tablet 0.4  mg   dexamethasone (DECADRON) injection 8 mg    -I have reviewed the patients home medicines and have made adjustments  as needed  Critical interventions BiPAP, nitroglycerin, Decadron  Consultations Obtained: I requested consultation with the nephrologist Dr. Joelyn Oms,  and discussed lab and imaging findings as well as pertinent plan - they recommend: Dialysis   Cardiac Monitoring: The patient was maintained on a cardiac monitor.  I personally viewed and interpreted the cardiac monitored which showed an underlying rhythm of: NSR  Social Determinants of Health:  Factors impacting patients care include: none   Reevaluation: After the interventions noted above, I reevaluated the patient and found that they have :improved  Co morbidities that complicate the patient evaluation  Past Medical History:  Diagnosis Date   Acute diastolic CHF (congestive heart failure) ([place]) 06/11/2012   EF 50-55% [facility])   Chronic kidney disease    COPD (chronic obstructive pulmonary disease) ([place])    Diabetes mellitus    A1c 11.5 (06/11/2012).   Dyspnea    Gout    Hepatic steatosis 06/11/2012   Elevated LFTs   Hyperlipemia    Malignant hypertension    Microcytic anemia 06/12/2012   Obesity       Dispostion: I considered admission for this patient, and patient require hospital mission for flash pulmonary edema     Final Clinical Impression(s) / ED Diagnoses Final diagnoses:  None     @PCDICTATION @    Teressa Lower, MD 11/04/22 1003

## 2022-11-04 NOTE — Hospital Course (Signed)
Kelly Key is a 52 year old male with a history of ESRD on HD TTS [facility], combined D/S CHF, COPD, DM2, HTN, HLD,... Last hospitalization for acute supraglottitis, dysphagia (discharged 10/18/2022)... Presenting today with shortness of breath.  Patient was apparently at dialysis where he became shortness of breath, was unable to continue, productive cough for past 1-2 days, cold sweats with nausea.  Patient was placed on CPAP increased work of breathing Rales..  Was noted to be in hypertensive state with a systolic blood pressure> 092.  ED course: Upon arrival SBP>180 It was thought the patient is having flash pulmonary edema. Patient was placed on BiPAP, nitroglycerin IV was given  Current vitals Blood pressure (!) 151/65, pulse 74, temp. (!) 97.2 F (36.2 C), RR 15, SpO2 100 % -on BiPAP ABG    Component Value Date/Time   PHART 7.278 (L) 05/14/2021 0810   PCO2ART 50.1 (H) 05/14/2021 0810   PO2ART 79.0 (L) 05/14/2021 0810   HCO3 31.1 (H) 11/04/2022 0859   TCO2 27 11/11/2019 0636   ACIDBASEDEF 3.0 (H) 05/14/2021 0810   O2SAT 34.6 11/04/2022 0859   BMP sodium 134, potassium 5, glucose 130, BUN 61, creatinine 10.56, calcium 8.7, BNP 596, troponin 24, WBC 7.8, hemoglobin 11.6,  Influenza A/B, RSV --all negative SARS-CoV-2 positive   Chest x-ray: Increased bilateral diffuse interstitial opacities are noted most consistent with pulmonary edema. EDP initiated IV steroids -Decadron for pulmonary edema, COVID-19 infection and nitroglycerin EDP discussed the case with nephrology Dr. Joelyn Oms will arrange for dialysis to be resumed today

## 2022-11-04 NOTE — Assessment & Plan Note (Signed)
-   Nephrology consulted -Normal dialysis days Tuesdays, Thursdays, Saturdays -Nephrology has been notified by [place] to resume hemodialysis today

## 2022-11-04 NOTE — Assessment & Plan Note (Signed)
>>  ASSESSMENT AND PLAN FOR ESRD (END STAGE RENAL DISEASE) (HCC) WRITTEN ON 11/04/2022 10:47 AM BY WILLETTE ADRIANA LABOR, MD  - Nephrology consulted -Normal dialysis days Tuesdays, Thursdays, Saturdays -Nephrology has been notified by EDP -Planning to resume hemodialysis today

## 2022-11-04 NOTE — Assessment & Plan Note (Signed)
-   Continue Allopurinol  - as needed colchicine -

## 2022-11-04 NOTE — Assessment & Plan Note (Signed)
-  Consult PT OT for evaluation recommendations

## 2022-11-04 NOTE — Assessment & Plan Note (Addendum)
-   Anticipating hyperglycemia with high steroid use for COVID treatment and respiratory failure -Checking blood sugar q. ACHS, high-dose SSI -Resuming long-acting insulin Levemir at 50 units nightly -Medications reviewed including Ozempic -A1c 7.4  10/18/22

## 2022-11-04 NOTE — TOC Initial Note (Signed)
Transition of Care [facility]) - Initial/Assessment Note    Patient Details  Name: Kelly Key MRN: [id number] Date of Birth: 05/06/1971  Transition of Care [facility]) CM/SW Contact:    Kelly Key, [place] Phone Number: 11/04/2022, 1:05 PM  Clinical Narrative:                 Pt is high risk for readmission. TOC also consulted for PCP needs. CSW noted per chart review that pt has a PCP. CSW spoke with pts friend and first contact Kelly Key to complete assessment. Kelly Key states she and pt live together. Pt is independent in completing his ADLs but she is able to assist when needed. She is able to provide transportation as well. Pt has HD T[facility] at [facility]. Pt does not use DME but has a walker to use if needed. TOC to follow.   Expected Discharge Plan: Home/Self Care Barriers to Discharge: Continued Medical Work up   Patient Goals and CMS Choice Patient states their goals for this hospitalization and ongoing recovery are:: return home CMS Medicare.gov Compare Post Acute Care list provided to:: Patient Represenative (must comment) Choice offered to / list presented to : Patient      Expected Discharge Plan and Services In-house Referral: Clinical Social Work Discharge Planning Services: CM Consult   Living arrangements for the past 2 months: [place]                                      Prior Living Arrangements/Services Living arrangements for the past 2 months: [place] Lives with:: Friends Patient language and need for interpreter reviewed:: Yes Do you feel safe going back to the place where you live?: Yes      Need for Family Participation in Patient Care: Yes (Comment) Care giver support system in place?: Yes (comment) Current home services: DME [name]) Criminal Activity/Legal Involvement Pertinent to Current Situation/Hospitalization: No - Comment as needed  Activities of Daily Living      Permission Sought/Granted                   Emotional Assessment         Alcohol / Substance Use: Not Applicable Psych Involvement: No (comment)  Admission diagnosis:  Acute respiratory failure due to COVID-19 ([place]) [U07.1, J96.00] Patient Active Problem List   Diagnosis Date Noted   Acute respiratory failure due to COVID-19 ([place]) 11/04/2022   Flash pulmonary edema ([place]) 11/04/2022   Dysphagia 10/18/2022   Osteomyelitis ([place]) 11/13/2021   OSA (obstructive sleep apnea) 10/04/2021   Obesity hypoventilation syndrome ([place]) 10/04/2021   Organic impotence 08/16/2021   NSTEMI (non-ST elevated myocardial infarction) ([place]) 05/17/2021   Acute gout of right knee 08/03/2019   Orthostasis    Pain    Physical debility 07/16/2019   ESRD (end stage renal disease) ([place])    Anemia of chronic disease    Uncontrolled type 2 diabetes mellitus with hyperglycemia ([place])    Volume overload 07/05/2019   Anasarca 07/03/2019   Dyslipidemia 07/03/2019   Chronic combined systolic and diastolic CHF (congestive heart failure) ([place]) 07/03/2019   Bilateral leg edema    Hypokalemia    Thromboembolism ([place]) 02/20/2019   Diabetes mellitus type 2 in nonobese ([place]) 06/12/2012   Hyponatremia 06/12/2012   Microcytic anemia 06/12/2012   Essential hypertension, benign 06/11/2012   Elevated LFTs 06/11/2012   Morbid obesity (  [place]) 06/11/2012   Hepatic steatosis 06/11/2012   PCP:  [name], MD Pharmacy:   [facility] [address], [place] [address] Phone: [phone] Fax: [place], [place] [address] [place] [address] Phone: [phone] Fax: [phone]     Social Determinants of Health (SDOH) Social History: [place]: No Food Insecurity (10/18/2022)  Housing: Low Risk  (10/18/2022)  Transportation Needs: No Transportation Needs (10/18/2022)  Utilities: Not At Risk (10/18/2022)  Depression (PHQ2-9): Low Risk   (12/25/2021)  Financial Resource Strain: Low Risk  (02/20/2019)  Physical Activity: Unknown (02/20/2019)  Social Connections: Unknown (02/20/2019)  Stress: No Stress Concern Present (02/20/2019)  Tobacco Use: Low Risk  (11/04/2022)   SDOH Interventions:     Readmission Risk Interventions    11/04/2022    1:04 PM 05/15/2021   11:21 AM  Readmission Risk Prevention Plan  Transportation Screening Complete Complete  Medication Review ([place]) Complete Complete  HRI or [place] Complete Complete  SW Recovery Care/Counseling Consult Complete Complete  Palliative Care Screening Not Applicable Not [place] Not Applicable Not Applicable

## 2022-11-04 NOTE — ED Notes (Addendum)
EDP notified about pt. meeting Sespis criteria. EDP says no blood cultures or lactate needed. No  additional orders needed at this time

## 2022-11-04 NOTE — Assessment & Plan Note (Addendum)
-  On arrival SBP >180 Chest x-ray: Increased bilateral diffuse interstitial opacities are noted most consistent with pulmonary edema.  -Responded to nitroglycerin -Blood pressures improved -Remained stable on BiPAP We will continue monitor very closely -Resuming home medication of Levitra

## 2022-11-04 NOTE — ED Triage Notes (Signed)
Pt was @ dialysis, became short of breath. Did not continue. Last few days with a cold, productive cough. This am, low CBG was 52, then 87 at dialysis center. Reported OB, encouraged to continue. Pt refused. Ems Placed on cpap 90@ 2L initially, Bp 160/102 upon triage. A&O x3. Pt states cannot lay flat.[id number] [phone] [id number]

## 2022-11-04 NOTE — Assessment & Plan Note (Addendum)
-   Due to flash pulmonary edema, accelerated hypertension, volume overload and SARS COVID 2 infection -Admitting to ICU -Initiating Paxlovid -IV steroids -Currently on BiPAP -currently satting 100%, anticipating weaning off BiPAP post hemodialysis -Continue scheduled bronchodilators, nebs -Continue mucolytic's

## 2022-11-04 NOTE — Progress Notes (Signed)
Patient did state to me during his treatment that he sometimes quits breathing while sleeping.  A sleep study was supposed to be scheduled for him, but he was put in the hospital he thinks, but somehow it got messed up and was never done.  Instructed patient to follow-up with his PCP to make sure that gets scheduled for him.  Will continue to monitor.

## 2022-11-04 NOTE — Assessment & Plan Note (Signed)
-  Scheduled for hemodialysis today

## 2022-11-04 NOTE — Progress Notes (Signed)
Received patient in bed to unit.  Alert and oriented.  Informed consent signed and in chart.   Treatment initiated: 1430 Treatment completed: 1752  Patient tolerated well.  Transported back to the room  Alert, without acute distress.  Hand-off given to patient's nurse.   Access used: AVF Access issues: none  Total UF removed: 4 L Medication(s) given: none Post HD VS: 140/91 P 90 R 18 Post HD weight: [place]   [facility]

## 2022-11-04 NOTE — Consult Note (Signed)
Kelly Key Admit Date: 11/04/2022 11/04/2022 [name] Requesting Physician:  Kommor MD  Reason for Consult:  AHRF, Vol Overload, ESRD  HPI:  47M ESRD THS [facility] using LUE AVF went to outpt HD today.  He became acutely dyspneic prior to HD when going to bathroom and asked for EMS to be called.  Brought to [facility] ED found to have acute HTN with emergency, AHRF req BIPAP, and is COVID+.  K 5.0, BUN 61.  LUE +B/T. pCXR with b/l diffuse interstitial opacities consistent with pulmonary edema.    In the ED patient is on BiPAP with 50% FIO2.  Briefly received IV nitroglycerin which was stopped after blood pressures fell too quickly.  At the time of my evaluation he is conversant through BiPAP and able to give his history.   ROS  Balance of 12 systems is negative w/ exceptions as above  PMH  Past Medical History:  Diagnosis Date   Acute diastolic CHF (congestive heart failure) ([place]) 06/11/2012   EF 50-55% [facility])   Chronic kidney disease    COPD (chronic obstructive pulmonary disease) (HCC)    Diabetes mellitus    A1c 11.5 (06/11/2012).   Dyspnea    Gout    Hepatic steatosis 06/11/2012   Elevated LFTs   Hyperlipemia    Malignant hypertension    Microcytic anemia 06/12/2012   Obesity    PSH  Past Surgical History:  Procedure Laterality Date   AV FISTULA PLACEMENT Left 11/11/2019   Procedure: ARTERIOVENOUS (AV) FISTULA CREATION LEFT BRACHIOCEPHALIC ARM;  Surgeon: Serafina Mitchell, MD;  Location: [place];  Service: Vascular;  Laterality: Left;   COLONOSCOPY WITH PROPOFOL N/A 09/02/2021   Procedure: COLONOSCOPY WITH PROPOFOL;  Surgeon: Jonathon Bellows, MD;  Location[facility] ENDOSCOPY;  Service: Gastroenterology;  Laterality: N/A;   IR FLUORO GUIDE CV LINE RIGHT  07/08/2019   IR REMOVAL TUN CV CATH W/O FL  03/23/2020   IR US GUIDE VASC ACCESS RIGHT  07/08/2019   LEFT HEART CATH AND CORONARY ANGIOGRAPHY N/A 06/12/2021   Procedure: LEFT HEART CATH AND CORONARY ANGIOGRAPHY;  Surgeon:  Belva Crome, MD;  Location: [place] CV LAB;  Service: Cardiovascular;  Laterality: N/A;   NO PAST SURGERIES     VASCULAR SURGERY     FH  Family History  Problem Relation Age of Onset   Gout Mother    Asthma Mother    Diabetes Father    Heart failure Father    Diabetes Sister    Hypertension Brother    Pancreatic cancer Brother    Diabetes Sister    SH  reports that he has never smoked. He has been exposed to tobacco smoke. He has never used smokeless tobacco. He reports that he does not currently use alcohol. He reports that he does not use drugs. Allergies  Allergies  Allergen Reactions   Hydralazine Anxiety    Hallucinations    Lisinopril Swelling   Home medications Prior to Admission medications   Medication Sig Start Date End Date Taking? Authorizing Provider  acetaminophen (TYLENOL) 500 MG tablet Take 1,000 mg by mouth every 6 (six) hours as needed for moderate pain or headache.   Yes [provider]  albuterol (VENTOLIN HFA) 108 (90 Base) MCG/ACT inhaler Inhale 2 puffs into the lungs every 4 (four) hours as needed for wheezing or shortness of breath.  03/24/19  Yes [provider]  allopurinol (ZYLOPRIM) 100 MG tablet Take 2 tablets (200 mg total) by  mouth daily. 08/04/19  Yes Love, Ivan Anchors, PA-C  amLODipine (NORVASC) 5 MG tablet Take 5 mg by mouth daily. 01/30/22  Yes [provider]  ammonium lactate (AMLACTIN) 12 % cream Apply 1 Application topically in the morning and at bedtime. 09/08/22  Yes [provider]  aspirin EC 81 MG EC tablet Take 1 tablet (81 mg total) by mouth daily. 08/04/19  Yes Love, Ivan Anchors, PA-C  atorvastatin (LIPITOR) 80 MG tablet Take 1 tablet (80 mg total) by mouth daily. 12/05/19  Yes Branch, Alphonse Guild, MD  Calcium Carbonate Antacid (CALCIUM CARBONATE, DOSED IN MG ELEMENTAL CALCIUM,) 1250 MG/5ML SUSP Take 5 mLs (500 mg of elemental calcium total) by mouth every 6 (six) hours as needed for indigestion. 07/16/19   Yes Amin, Jeanella Flattery, MD  carvedilol (COREG) 25 MG tablet Take 1 tablet (25 mg total) by mouth 2 (two) times daily with a meal. 08/03/19  Yes Love, Ivan Anchors, PA-C  cloNIDine (CATAPRES) 0.3 MG tablet Take 0.3 mg by mouth 3 (three) times daily.   Yes [provider]  esomeprazole (NEXIUM) 20 MG capsule Take 20 mg by mouth daily. 01/23/22  Yes [provider]  ezetimibe (ZETIA) 10 MG tablet Take 10 mg by mouth every evening. 01/20/19  Yes [provider]  fluticasone (FLONASE) 50 MCG/ACT nasal spray Place 1 spray into both nostrils daily as needed for allergies or rhinitis.   Yes [provider]  gabapentin (NEURONTIN) 100 MG capsule Take 1 capsule (100 mg total) by mouth 3 (three) times daily. 08/03/19  Yes Love, Ivan Anchors, PA-C  HUMALOG KWIKPEN 100 UNIT/ML KwikPen Inject 8-14 Units into the skin 3 (three) times daily. 08/19/22  Yes [provider]  hydrOXYzine (ATARAX) 25 MG tablet Take 25 mg by mouth 3 (three) times daily as needed for anxiety. 11/28/21  Yes [provider]  LEVEMIR FLEXTOUCH 100 UNIT/ML FlexTouch Pen INJECT 50 UNITS SUBCUTANEOUSLY AT BEDTIME Patient taking differently: Inject 50 Units into the skin at bedtime. 07/09/21  Yes Brita Romp, NP  multivitamin (RENA-VIT) TABS tablet Take 1 tablet by mouth at bedtime. 08/03/19  Yes Love, Ivan Anchors, PA-C  OZEMPIC, 1 MG/DOSE, 4 MG/3ML SOPN Inject 1 mg into the skin once a week. Fridays.   Yes [provider]  sevelamer carbonate (RENVELA) 800 MG tablet Take 3 tablets (2,400 mg total) by mouth 3 (three) times daily with meals. 08/03/19  Yes Love, Ivan Anchors, PA-C  vardenafil (LEVITRA) 20 MG tablet Take 1 tablet (20 mg total) by mouth daily as needed for erectile dysfunction. 08/16/21  Yes Stoneking, Reece Leader., MD  AMBULATORY NON FORMULARY MEDICATION Inject 0.2 ml by intracavernosal route as directed.  Medication Name: TriMix PGE 10 mcg Pap   30 mg Phent 1 mg 01/01/22   Stoneking,  Reece Leader., MD  furosemide (LASIX) 80 MG tablet Take 1 tablet (80 mg total) by mouth daily. 05/16/21   Shahmehdi, Erling Conte A, MD  glucose blood (ACCU-CHEK AVIVA PLUS) test strip Use as instructed TO CHECK BLOOD GLUCOSE THREE TIMES DAILY 08/15/20   Nida, Marella Chimes, MD  Insulin Pen Needle (B-D ULTRAFINE III SHORT PEN) 31G X 8 MM MISC 1 each by Does not apply route as directed. 08/13/20   Cassandria Anger, MD  linaclotide Rolan Lipa) 145 MCG CAPS capsule Take 1 capsule (145 mcg total) by mouth daily before breakfast. Patient not taking: Reported on 11/04/2022 04/05/18   Heath Lark D, DO  mupirocin ointment (BACTROBAN) 2 % Apply 1  Application topically 2 (two) times daily.    [provider]    Current Medications Scheduled Meds:  [START ON 11/05/2022] allopurinol  200 mg Oral Daily   [START ON 11/05/2022] amLODipine  5 mg Oral Daily   vitamin C  500 mg Oral Daily   [START ON 11/05/2022] aspirin EC  81 mg Oral Daily   bebtelovimab EUA  175 mg Intravenous Once   carvedilol  25 mg Oral BID WC   chlorpheniramine-HYDROcodone  5 mL Oral Q12H   cloNIDine  0.3 mg Oral TID   dexamethasone (DECADRON) injection  6 mg Intravenous Daily   ezetimibe  10 mg Oral QPM   furosemide  80 mg Oral Daily   gabapentin  100 mg Oral TID   guaiFENesin-dextromethorphan  10 mL Oral Q8H   heparin  5,000 Units Subcutaneous Q8H   insulin aspart  0-20 Units Subcutaneous TID WC   insulin detemir  50 Units Subcutaneous QHS   Ipratropium-Albuterol  1 puff Inhalation Q6H   mupirocin ointment  1 Application Topical BID   nirmatrelvir/ritonavir (renal dosing)  2 tablet Oral BID   pantoprazole  40 mg Oral Daily   sevelamer carbonate  2,400 mg Oral TID WC   sodium chloride flush  3 mL Intravenous Q12H   sodium chloride flush  3 mL Intravenous Q12H   zinc sulfate  220 mg Oral Daily   Continuous Infusions:  sodium chloride     sodium chloride     PRN Meds:.sodium chloride, sodium chloride, acetaminophen **OR**  acetaminophen, bisacodyl, calcium carbonate, fluticasone, hydrALAZINE, HYDROmorphone (DILAUDID) injection, hydrOXYzine, labetalol, ondansetron **OR** ondansetron (ZOFRAN) IV, oxyCODONE, senna-docusate, sodium phosphate, zolpidem  CBC No results for input(s): "WBC", "NEUTROABS", "HGB", "HCT", "MCV", "PLT" in the last 168 hours. Basic Metabolic Panel Recent Labs  Lab 11/04/22 0859  NA 134*  K 5.0  CL 93*  CO2 27  GLUCOSE 130*  BUN 61*  CREATININE 10.56*  CALCIUM 8.7*    Physical Exam  Blood pressure (!) 151/65, pulse 74, temperature (!) 97.2 F (36.2 C), temperature source Axillary, resp. rate 15, SpO2 100 %. GEN: On BiPAP, conversant, obese ENT: BiPAP ON EYES: EOMI CV: regular, normal rate, no rub PULM: coarse bs b/l ABD: s/nt/nd SKIN: No rashes/lesions EXT: No sig LEE VASC: LUE AVF good B/T  Assessment 16M ESRD THS [facility] LUE AVF with HTN emergency, AHRF on BiPAP, and COVID19 PNA.  ESRD on HD THS [facility] via LUE AVF: HD Today AHRF on BiPAP 2/2 volume overload + COVID19 + HTN emergency COVID19 PNA HTN Emergency with AHRF ANemia Hb 9.3, CTM for now BMM: P and Ca ok, cont outpt meds  Plan HD today next case: 2K, 3-4L UF, no heparin. Use AVF  [name]  11/04/2022, 11:13 AM

## 2022-11-04 NOTE — H&P (Signed)
History and Physical   Patient: Kelly Key                            PCP: [name], MD                    DOB: [birth year]            DOA: 11/04/2022 NUU:[id number]             DOS: 11/04/2022, 10:59 AM  [name], MD  Patient coming from:   HOME  I have personally reviewed patient's medical records, in electronic medical records, including:  [place] link, and care everywhere.    Chief Complaint:   Chief Complaint  Patient presents with   Shortness of Breath    History of present illness:    Kelly Key is a 52 year old male with a history of ESRD on HD TTS [facility], combined D/S CHF, COPD, DM2, HTN, HLD,... Last hospitalization for acute supraglottitis, dysphagia (discharged 10/18/2022)... Presenting today with shortness of breath.  Patient was apparently at dialysis where he became shortness of breath, was unable to continue, productive cough for past 1-2 days, cold sweats with nausea.  Patient was placed on CPAP increased work of breathing [place]..  Was noted to be in hypertensive state with a systolic blood pressure> 347.  ED course: Upon arrival SBP>180 It was thought the patient is having flash pulmonary edema. Patient was placed on BiPAP, nitroglycerin IV was given  Current vitals Blood pressure (!) 151/65, pulse 74, temp. (!) 97.2 F (36.2 C), RR 15, SpO2 100 % -on BiPAP ABG    Component Value Date/Time   PHART 7.278 (L) 05/14/2021 0810   PCO2ART 50.1 (H) 05/14/2021 0810   PO2ART 79.0 (L) 05/14/2021 0810   HCO3 31.1 (H) 11/04/2022 0859   TCO2 27 11/11/2019 0636   ACIDBASEDEF 3.0 (H) 05/14/2021 0810   O2SAT 34.6 11/04/2022 0859   BMP sodium 134, potassium 5, glucose 130, BUN 61, creatinine 10.56, calcium 8.7, BNP 596, troponin 24, WBC 7.8, hemoglobin 11.6,  Influenza A/B, RSV --all negative SARS-CoV-2 positive   Chest x-ray: Increased bilateral diffuse interstitial opacities are noted most consistent with pulmonary edema. EDP initiated IV steroids  -Decadron for pulmonary edema, COVID-19 infection and nitroglycerin EDP discussed the case with nephrology Dr. Joelyn Oms will arrange for dialysis to be resumed today     Patient Denies having: Fever, Chills, Cough, SOB, Chest Pain, Abd pain, N/V/D, headache, dizziness, lightheadedness,  Dysuria, Joint pain, rash, open wounds    Review of Systems: As per HPI, otherwise 10 point review of systems were negative.   ----------------------------------------------------------------------------------------------------------------------  Allergies  Allergen Reactions   Hydralazine Anxiety    Hallucinations    Lisinopril Swelling    Home MEDs:  Prior to Admission medications   Medication Sig Start Date End Date Taking? Authorizing Provider  acetaminophen (TYLENOL) 500 MG tablet Take 1,000 mg by mouth every 6 (six) hours as needed for moderate pain or headache.   Yes [provider]  albuterol (VENTOLIN HFA) 108 (90 Base) MCG/ACT inhaler Inhale 2 puffs into the lungs every 4 (four) hours as needed for wheezing or shortness of breath.  03/24/19  Yes [provider]  allopurinol (ZYLOPRIM) 100 MG tablet Take 2 tablets (200 mg total) by mouth daily. 08/04/19  Yes Love, Ivan Anchors, PA-C  amLODipine (NORVASC) 5 MG tablet Take 5 mg by mouth daily. 01/30/22  Yes  [provider]  ammonium lactate (AMLACTIN) 12 % cream Apply 1 Application topically in the morning and at bedtime. 09/08/22  Yes [provider]  aspirin EC 81 MG EC tablet Take 1 tablet (81 mg total) by mouth daily. 08/04/19  Yes Love, Ivan Anchors, PA-C  atorvastatin (LIPITOR) 80 MG tablet Take 1 tablet (80 mg total) by mouth daily. 12/05/19  Yes Branch, Alphonse Guild, MD  Calcium Carbonate Antacid (CALCIUM CARBONATE, DOSED IN MG ELEMENTAL CALCIUM,) 1250 MG/5ML SUSP Take 5 mLs (500 mg of elemental calcium total) by mouth every 6 (six) hours as needed for indigestion. 07/16/19  Yes Amin, Jeanella Flattery, MD  carvedilol (COREG)  25 MG tablet Take 1 tablet (25 mg total) by mouth 2 (two) times daily with a meal. 08/03/19  Yes Love, Ivan Anchors, PA-C  cloNIDine (CATAPRES) 0.3 MG tablet Take 0.3 mg by mouth 3 (three) times daily.   Yes [provider]  esomeprazole (NEXIUM) 20 MG capsule Take 20 mg by mouth daily. 01/23/22  Yes [provider]  ezetimibe (ZETIA) 10 MG tablet Take 10 mg by mouth every evening. 01/20/19  Yes [provider]  fluticasone (FLONASE) 50 MCG/ACT nasal spray Place 1 spray into both nostrils daily as needed for allergies or rhinitis.   Yes [provider]  gabapentin (NEURONTIN) 100 MG capsule Take 1 capsule (100 mg total) by mouth 3 (three) times daily. 08/03/19  Yes Love, Ivan Anchors, PA-C  HUMALOG KWIKPEN 100 UNIT/ML KwikPen Inject 8-14 Units into the skin 3 (three) times daily. 08/19/22  Yes [provider]  hydrOXYzine (ATARAX) 25 MG tablet Take 25 mg by mouth 3 (three) times daily as needed for anxiety. 11/28/21  Yes [provider]  LEVEMIR FLEXTOUCH 100 UNIT/ML FlexTouch Pen INJECT 50 UNITS SUBCUTANEOUSLY AT BEDTIME Patient taking differently: Inject 50 Units into the skin at bedtime. 07/09/21  Yes Brita Romp, NP  multivitamin (RENA-VIT) TABS tablet Take 1 tablet by mouth at bedtime. 08/03/19  Yes Love, Ivan Anchors, PA-C  OZEMPIC, 1 MG/DOSE, 4 MG/3ML SOPN Inject 1 mg into the skin once a week. Fridays.   Yes [provider]  sevelamer carbonate (RENVELA) 800 MG tablet Take 3 tablets (2,400 mg total) by mouth 3 (three) times daily with meals. 08/03/19  Yes Love, Ivan Anchors, PA-C  vardenafil (LEVITRA) 20 MG tablet Take 1 tablet (20 mg total) by mouth daily as needed for erectile dysfunction. 08/16/21  Yes Stoneking, Reece Leader., MD  AMBULATORY NON FORMULARY MEDICATION Inject 0.2 ml by intracavernosal route as directed.  Medication Name: TriMix PGE 10 mcg Pap   30 mg Phent 1 mg 01/01/22   Stoneking, Reece Leader., MD  furosemide (LASIX) 80 MG tablet  Take 1 tablet (80 mg total) by mouth daily. 05/16/21   Elijio Staples, Erling Conte A, MD  glucose blood (ACCU-CHEK AVIVA PLUS) test strip Use as instructed TO CHECK BLOOD GLUCOSE THREE TIMES DAILY 08/15/20   Nida, Marella Chimes, MD  Insulin Pen Needle (B-D ULTRAFINE III SHORT PEN) 31G X 8 MM MISC 1 each by Does not apply route as directed. 08/13/20   Cassandria Anger, MD  linaclotide Rolan Lipa) 145 MCG CAPS capsule Take 1 capsule (145 mcg total) by mouth daily before breakfast. Patient not taking: Reported on 11/04/2022 04/05/18   Heath Lark D, DO  mupirocin ointment (BACTROBAN) 2 % Apply 1 Application topically 2 (two) times daily.    [provider]    PRN MEDs: sodium chloride, sodium chloride, acetaminophen **OR** acetaminophen,  bisacodyl, calcium carbonate (dosed in mg elemental calcium), fluticasone, hydrALAZINE, HYDROmorphone (DILAUDID) injection, hydrOXYzine, labetalol, ondansetron **OR** ondansetron (ZOFRAN) IV, oxyCODONE, senna-docusate, sodium phosphate, vardenafil, zolpidem  Past Medical History:  Diagnosis Date   Acute diastolic CHF (congestive heart failure) ([place]) 06/11/2012   EF 50-55% [facility])   Chronic kidney disease    COPD (chronic obstructive pulmonary disease) (HCC)    Diabetes mellitus    A1c 11.5 (06/11/2012).   Dyspnea    Gout    Hepatic steatosis 06/11/2012   Elevated LFTs   Hyperlipemia    Malignant hypertension    Microcytic anemia 06/12/2012   Obesity     Past Surgical History:  Procedure Laterality Date   AV FISTULA PLACEMENT Left 11/11/2019   Procedure: ARTERIOVENOUS (AV) FISTULA CREATION LEFT BRACHIOCEPHALIC ARM;  Surgeon: Serafina Mitchell, MD;  Location: [place];  Service: Vascular;  Laterality: Left;   COLONOSCOPY WITH PROPOFOL N/A 09/02/2021   Procedure: COLONOSCOPY WITH PROPOFOL;  Surgeon: Jonathon Bellows, MD;  [facility] ENDOSCOPY;  Service: Gastroenterology;  Laterality: N/A;   IR FLUORO GUIDE CV LINE RIGHT  07/08/2019   IR REMOVAL TUN CV  CATH W/O FL  03/23/2020   IR US GUIDE VASC ACCESS RIGHT  07/08/2019   LEFT HEART CATH AND CORONARY ANGIOGRAPHY N/A 06/12/2021   Procedure: LEFT HEART CATH AND CORONARY ANGIOGRAPHY;  Surgeon: Belva Crome, MD;  Location: [place] CV LAB;  Service: Cardiovascular;  Laterality: N/A;   NO PAST SURGERIES     VASCULAR SURGERY       reports that he has never smoked. He has been exposed to tobacco smoke. He has never used smokeless tobacco. He reports that he does not currently use alcohol. He reports that he does not use drugs.   Family History  Problem Relation Age of Onset   Gout Mother    Asthma Mother    Diabetes Father    Heart failure Father    Diabetes Sister    Hypertension Brother    Pancreatic cancer Brother    Diabetes Sister     Physical Exam:   Vitals:   11/04/22 0915 11/04/22 0930 11/04/22 0945 11/04/22 1021  BP: (!) 160/99 (!) 153/109 (!) 151/65   Pulse: 79 77 74   Resp: 14 17 15    Temp:      TempSrc:      SpO2: 100% 100% 100% 100%   Constitutional: Uncomfortable, on high flow oxygen, on dialysis Eyes: PERRL, lids and conjunctivae normal ENMT: Mucous membranes are moist. Posterior pharynx clear of any exudate or lesions.Normal dentition.  Neck: normal, supple, no masses, no thyromegaly Respiratory: In respite distress with shortness of breath on high flow oxygen, off BiPAP briefly, crackles and rails in lower lobes Cardiovascular: Regular rate and rhythm, no murmurs / rubs / gallops. No extremity edema. 2+ pedal pulses. No carotid bruits.  Abdomen: no tenderness, no masses palpated. No hepatosplenomegaly. Bowel sounds positive.  Musculoskeletal: no clubbing / cyanosis. No joint deformity upper and lower extremities. Good ROM, no contractures. Normal muscle tone.  Neurologic: CN II-XII grossly intact. Sensation intact, DTR normal. Strength 5/5 in all 4.  Psychiatric: Normal judgment and insight. Alert and oriented x 3. Normal mood.  Skin: no rashes, lesions,  ulcers. No induration Decubitus/ulcers:  Wounds: per nursing documentation         Labs on admission:    I have personally reviewed following labs and imaging studies  CBC: No results for input(s): "WBC", "NEUTROABS", "HGB", "HCT", "MCV", "PLT"  in the last 168 hours. Basic Metabolic Panel: Recent Labs  Lab 11/04/22 0859  NA 134*  K 5.0  CL 93*  CO2 27  GLUCOSE 130*  BUN 61*  CREATININE 10.56*  CALCIUM 8.7*   GFR: CrCl cannot be calculated (Unknown ideal weight.). Liver Function Tests: Recent Labs  Lab 11/04/22 0859  AST 23  ALT 20  ALKPHOS 69  BILITOT 0.8  PROT 7.9  ALBUMIN 3.7    Urine analysis:    Component Value Date/Time   COLORURINE STRAW (A) 04/20/2021 1740   APPEARANCEUR CLEAR 04/20/2021 1740   LABSPEC 1.011 04/20/2021 1740   PHURINE 8.0 04/20/2021 1740   GLUCOSEU 150 (A) 04/20/2021 1740   HGBUR NEGATIVE 04/20/2021 1740   BILIRUBINUR NEGATIVE 04/20/2021 1740   KETONESUR NEGATIVE 04/20/2021 1740   PROTEINUR 100 (A) 04/20/2021 1740   UROBILINOGEN 0.2 06/11/2012 0518   NITRITE NEGATIVE 04/20/2021 1740   LEUKOCYTESUR NEGATIVE 04/20/2021 1740    Last A1C:  Lab Results  Component Value Date   HGBA1C 7.4 (H) 10/18/2022     Radiologic Exams on Admission:   DG Chest Portable 1 View  Result Date: 11/04/2022 CLINICAL DATA:  Dyspnea. EXAM: PORTABLE CHEST 1 VIEW COMPARISON:  September 20, 2022. FINDINGS: Stable cardiomediastinal silhouette. Increased bilateral diffuse interstitial densities are noted most consistent with pulmonary edema. Bony thorax is unremarkable. IMPRESSION: Increased bilateral diffuse interstitial opacities are noted most consistent with pulmonary edema. Electronically Signed   By: Marijo Conception M.D.   On: 11/04/2022 08:27    EKG:   Independently reviewed.  Orders placed or performed during the hospital encounter of 11/04/22   EKG 12-Lead    ---------------------------------------------------------------------------------------------------------------------------------------    Assessment / Plan:   Principal Problem:   Acute respiratory failure due to COVID-19 [facility]) Active Problems:   Flash pulmonary edema (HCC)   ESRD (end stage renal disease) (HCC)   Acute gout of right knee   Hepatic steatosis   Dyslipidemia   Chronic combined systolic and diastolic CHF (congestive heart failure) (HCC)   Volume overload   Physical debility   Anemia of chronic disease   Uncontrolled type 2 diabetes mellitus with hyperglycemia (HCC)   Assessment and Plan: * Acute respiratory failure due to COVID-19 ([place]) - Due to flash pulmonary edema, accelerated hypertension, volume overload and SARS COVID 2 infection -Admitting to ICU -Initiating Paxlovid -IV steroids -Currently on BiPAP -currently satting 100%, anticipating weaning off BiPAP post hemodialysis -Continue scheduled bronchodilators, nebs -Continue mucolytic's  Flash pulmonary edema (HCC) -On arrival SBP >180 Chest x-ray: Increased bilateral diffuse interstitial opacities are noted most consistent with pulmonary edema.  -Responded to nitroglycerin -Blood pressures improved -Remained stable on BiPAP We will continue monitor very closely -Resuming home medication of Levitra  ESRD (end stage renal disease) ([place]) - Nephrology consulted -Normal dialysis days Tuesdays, Thursdays, Saturdays -Nephrology has been notified by [place] to resume hemodialysis today  Acute gout of right knee - Continue Allopurinol  - as needed colchicine -  Uncontrolled type 2 diabetes mellitus with hyperglycemia (HCC) - Anticipating hyperglycemia with high steroid use for COVID treatment and respiratory failure -Checking blood sugar q. ACHS, high-dose SSI -Resuming long-acting insulin Levemir at 50 units nightly -Medications reviewed including Ozempic -A1c 7.4  10/18/22     Anemia  of chronic disease - Monitoring H&H, currently stable   Physical debility -Consult PT OT for evaluation recommendations  Volume overload -Scheduled for hemodialysis today  Chronic combined systolic and diastolic CHF (congestive heart failure) ([place]) -  On hemodialysis -Mildly volume overload -Reviewing resuming home medications, including p.o. Lasix  Dyslipidemia - We will holding atorvastatin as patient has COVID will be on Paxlovid  Hepatic steatosis -Monitoring LFTs -Avoiding nephrotoxins -Unfortunately patient needs Paxlovid-overtreatment     Consults called: Nephrology -------------------------------------------------------------------------------------------------------------------------------------------- DVT prophylaxis:  heparin injection 5,000 Units Start: 11/04/22 1400 SCDs Start: 11/04/22 1025 TED hose Start: 11/04/22 1021 SCDs Start: 11/04/22 1021   Code Status:   Code Status: Full Code   Admission status: Patient will be admitted as Inpatient, with a greater than 2 midnight length of stay. Level of care: ICU   Family Communication:  none at bedside  (The above findings and plan of care has been discussed with patient in detail, the patient expressed understanding and agreement of above plan)  --------------------------------------------------------------------------------------------------------------------------------------------------  Disposition Plan:  Anticipated 1-2 days Status is: Inpatient Remains inpatient appropriate because: Needing respiratory support, BiPAP, hemodialysis, medical management     ----------------------------------------------------------------------------------------------------------------------------------------------------  Time spent: > than  75  Min.  Was spent seeing evaluating the patient, drawn plan of care and admitted to ICU setting-reviewing all medical records, discussing with consultants.[name]  SIGNED: Deatra James, MD, FHM. FAAFP. [place] [facility], Pager  (Please use [email or web address] to page/ or secure chat through epic) If 7PM-7AM, please contact night-coverage [email or web address],  11/04/2022, 10:59 AM

## 2022-11-04 NOTE — ED Notes (Signed)
Lab advised that room 5 has a critical Po2 -31

## 2022-11-04 NOTE — Assessment & Plan Note (Signed)
-   We will holding atorvastatin as patient has COVID will be on Paxlovid

## 2022-11-04 NOTE — Assessment & Plan Note (Signed)
-   On hemodialysis -Mildly volume overload -Reviewing resuming home medications, including p.o. Lasix

## 2022-11-05 LAB — PROTIME-INR
INR: 1.2 (ref 0.8–1.2)
Prothrombin Time: 14.8 seconds (ref 11.4–15.2)

## 2022-11-05 LAB — BASIC METABOLIC PANEL
Anion gap: 14 (ref 5–15)
BUN: 55 mg/dL — ABNORMAL HIGH (ref 6–20)
CO2: 26 mmol/L (ref 22–32)
Calcium: 8.3 mg/dL — ABNORMAL LOW (ref 8.9–10.3)
Chloride: 91 mmol/L — ABNORMAL LOW (ref 98–111)
Creatinine, Ser: 8.82 mg/dL — ABNORMAL HIGH (ref 0.61–1.24)
GFR, Estimated: 7 mL/min — ABNORMAL LOW (ref >60)
Glucose, Bld: 271 mg/dL — ABNORMAL HIGH (ref 70–99)
Potassium: 5.2 mmol/L — ABNORMAL HIGH (ref 3.5–5.1)
Sodium: 131 mmol/L — ABNORMAL LOW (ref 135–145)

## 2022-11-05 LAB — GLUCOSE, CAPILLARY
Glucose-Capillary: 199 mg/dL — ABNORMAL HIGH (ref 70–99)
Glucose-Capillary: 235 mg/dL — ABNORMAL HIGH (ref 70–99)

## 2022-11-05 LAB — MRSA NEXT GEN BY PCR, NASAL: MRSA by PCR Next Gen: DETECTED — AB

## 2022-11-05 LAB — HEPATITIS B SURFACE ANTIBODY, QUANTITATIVE: Hep B S AB Quant (Post): 27.1 m[IU]/mL (ref >9.9)

## 2022-11-05 LAB — C-REACTIVE PROTEIN: CRP: 3.5 mg/dL — ABNORMAL HIGH (ref <1.0)

## 2022-11-05 LAB — APTT: aPTT: 30 seconds (ref 24–36)

## 2022-11-05 LAB — CBC
HCT: 29.6 % — ABNORMAL LOW (ref 39.0–52.0)
Hemoglobin: 9.4 g/dL — ABNORMAL LOW (ref 13.0–17.0)
MCH: 28.3 pg (ref 26.0–34.0)
MCHC: 31.8 g/dL (ref 30.0–36.0)
MCV: 89.2 fL (ref 80.0–100.0)
Platelets: 216 10*3/uL (ref 150–400)
RBC: 3.32 MIL/uL — ABNORMAL LOW (ref 4.22–5.81)
RDW: 14.7 % (ref 11.5–15.5)
WBC: 7.9 10*3/uL (ref 4.0–10.5)
nRBC: 0 % (ref 0.0–0.2)

## 2022-11-05 MED ORDER — GUAIFENESIN-DM 100-10 MG/5ML PO SYRP: 10.0000 mL | ORAL_SOLUTION | Freq: Three times a day (TID) | ORAL | 0 refills | Status: DC

## 2022-11-05 MED ORDER — IPRATROPIUM-ALBUTEROL 20-100 MCG/ACT IN AERS: 1.0000 | INHALATION_SPRAY | Freq: Four times a day (QID) | RESPIRATORY_TRACT | 0 refills | Status: DC | PRN

## 2022-11-05 MED ORDER — ASCORBIC ACID 500 MG PO TABS: 500.0000 mg | ORAL_TABLET | Freq: Every day | ORAL | 0 refills | Status: AC

## 2022-11-05 MED ORDER — DEXAMETHASONE 6 MG PO TABS: 6.0000 mg | ORAL_TABLET | Freq: Every day | ORAL | 0 refills | Status: AC

## 2022-11-05 MED ORDER — ZINC SULFATE 220 (50 ZN) MG PO CAPS: 220.0000 mg | ORAL_CAPSULE | Freq: Every day | ORAL | 0 refills | Status: AC

## 2022-11-05 MED ORDER — NIRMATRELVIR/RITONAVIR (PAXLOVID) TABLET (RENAL DOSING): 2.0000 | ORAL_TABLET | Freq: Two times a day (BID) | ORAL | 0 refills | Status: AC

## 2022-11-05 MED ORDER — ORAL CARE MOUTH RINSE: 15.0000 mL | OROMUCOSAL | Status: DC | PRN

## 2022-11-05 NOTE — Progress Notes (Signed)
Nsg Discharge Note  Admit Date:  11/04/2022 Discharge date: 11/05/2022   Kelly Key to be D/C'd Home per MD order.  AVS completed.  Copy for chart, and copy for patient signed, and dated. Patient/caregiver able to verbalize understanding.  Discharge Medication: Allergies as of 11/05/2022       Reactions   Hydralazine Anxiety   Hallucinations    Lisinopril Swelling        Medication List     TAKE these medications    Accu-Chek Aviva Plus test strip Generic drug: glucose blood Use as instructed TO CHECK BLOOD GLUCOSE THREE TIMES DAILY   acetaminophen 500 MG tablet Commonly known as: TYLENOL Take 1,000 mg by mouth every 6 (six) hours as needed for moderate pain or headache.   albuterol 108 (90 Base) MCG/ACT inhaler Commonly known as: VENTOLIN HFA Inhale 2 puffs into the lungs every 4 (four) hours as needed for wheezing or shortness of breath.   allopurinol 100 MG tablet Commonly known as: ZYLOPRIM Take 2 tablets (200 mg total) by mouth daily.   AMBULATORY NON FORMULARY MEDICATION Inject 0.2 ml by intracavernosal route as directed.  Medication Name: TriMix PGE 10 mcg Pap   30 mg Phent 1 mg   amLODipine 5 MG tablet Commonly known as: NORVASC Take 5 mg by mouth daily.   ammonium lactate 12 % cream Commonly known as: AMLACTIN Apply 1 Application topically in the morning and at bedtime.   ascorbic acid 500 MG tablet Commonly known as: VITAMIN C Take 1 tablet (500 mg total) by mouth daily. Start taking on: November 06, 2022   aspirin EC 81 MG tablet Take 1 tablet (81 mg total) by mouth daily.   atorvastatin 80 MG tablet Commonly known as: LIPITOR Take 1 tablet (80 mg total) by mouth daily.   B-D ULTRAFINE III SHORT PEN 31G X 8 MM Misc Generic drug: Insulin Pen Needle 1 each by Does not apply route as directed.   calcium carbonate (dosed in mg elemental calcium) 1250 MG/5ML Susp Take 5 mLs (500 mg of elemental calcium total) by mouth every 6 (six) hours as  needed for indigestion.   carvedilol 25 MG tablet Commonly known as: COREG Take 1 tablet (25 mg total) by mouth 2 (two) times daily with a meal.   cloNIDine 0.3 MG tablet Commonly known as: CATAPRES Take 0.3 mg by mouth 3 (three) times daily.   dexamethasone 6 MG tablet Commonly known as: DECADRON Take 1 tablet (6 mg total) by mouth daily for 5 days.   esomeprazole 20 MG capsule Commonly known as: NEXIUM Take 20 mg by mouth daily.   ezetimibe 10 MG tablet Commonly known as: ZETIA Take 10 mg by mouth every evening.   fluticasone 50 MCG/ACT nasal spray Commonly known as: FLONASE Place 1 spray into both nostrils daily as needed for allergies or rhinitis.   furosemide 80 MG tablet Commonly known as: LASIX Take 1 tablet (80 mg total) by mouth daily.   gabapentin 100 MG capsule Commonly known as: NEURONTIN Take 1 capsule (100 mg total) by mouth 3 (three) times daily.   guaiFENesin-dextromethorphan 100-10 MG/5ML syrup Commonly known as: ROBITUSSIN DM Take 10 mLs by mouth every 8 (eight) hours.   HumaLOG KwikPen 100 UNIT/ML KwikPen Generic drug: insulin lispro Inject 8-14 Units into the skin 3 (three) times daily.   hydrOXYzine 25 MG tablet Commonly known as: ATARAX Take 25 mg by mouth 3 (three) times daily as needed for anxiety.   Ipratropium-Albuterol 20-100  MCG/ACT Aers respimat Commonly known as: COMBIVENT Inhale 1 puff into the lungs every 6 (six) hours as needed for wheezing or shortness of breath.   Levemir FlexTouch 100 UNIT/ML FlexPen Generic drug: insulin detemir INJECT 50 UNITS SUBCUTANEOUSLY AT BEDTIME What changed: See the new instructions.   linaclotide 145 MCG Caps capsule Commonly known as: LINZESS Take 1 capsule (145 mcg total) by mouth daily before breakfast.   multivitamin Tabs tablet Take 1 tablet by mouth at bedtime.   mupirocin ointment 2 % Commonly known as: BACTROBAN Apply 1 Application topically 2 (two) times daily.    nirmatrelvir/ritonavir (renal dosing) 10 x 150 MG & 10 x 100MG  Tabs Commonly known as: PAXLOVID Take 2 tablets by mouth 2 (two) times daily for 4 days. Patient GFR is 7 . Take nirmatrelvir (150 mg) one tablet twice daily for 5 days and ritonavir (100 mg) one tablet twice daily for 5 days.   Ozempic (1 MG/DOSE) 4 MG/3ML Sopn Generic drug: Semaglutide (1 MG/DOSE) Inject 1 mg into the skin once a week. Fridays.   sevelamer carbonate 800 MG tablet Commonly known as: RENVELA Take 3 tablets (2,400 mg total) by mouth 3 (three) times daily with meals.   vardenafil 20 MG tablet Commonly known as: LEVITRA Take 1 tablet (20 mg total) by mouth daily as needed for erectile dysfunction.   zinc sulfate 220 (50 Zn) MG capsule Take 1 capsule (220 mg total) by mouth daily. Start taking on: November 06, 2022        Discharge Assessment: Vitals:   11/05/22 0900 11/05/22 1111  BP: 135/79   Pulse: 90   Resp:  20  Temp:    SpO2: 91% 97%   Skin clean, dry and intact without evidence of skin break down, no evidence of skin tears noted. IV catheter discontinued intact. Site without signs and symptoms of complications - no redness or edema noted at insertion site, patient denies c/o pain - only slight tenderness at site.  Dressing with slight pressure applied.  D/c Instructions-Education: Discharge instructions given to patient/family with verbalized understanding. D/c education completed with patient/family including follow up instructions, medication list, d/c activities limitations if indicated, with other d/c instructions as indicated by MD - patient able to verbalize understanding, all questions fully answered. Patient instructed to return to ED, call 911, or call MD for any changes in condition.  Patient escorted via [place], and D/C home via private auto.  Carney Corners, RN 11/05/2022 12:45 PM

## 2022-11-05 NOTE — TOC Transition Note (Signed)
Transition of Care [facility]) - CM/SW Discharge Note   Patient Details  Name: Kelly Key MRN: [id number] Date of Birth: 06-Dec-1970  [facility] CM/SW Contact:  Shade Flood, LCSW Phone Number: 11/05/2022, 12:01 PM   Clinical Narrative:     Pt stable for dc today per MD. PT recommending HHPT at dc. Spoke with pt to offer [facility] referral. Pt states that he thinks he is doing fine and does not need HH. Pt aware to follow up with his PCP if he changes his mind. No other TOC needs identified for dc.  Final next level of care: Home/Self Care Barriers to Discharge: Barriers Resolved   Patient Goals and CMS Choice CMS Medicare.gov Compare Post Acute Care list provided to:: Patient Represenative (must comment) Choice offered to / list presented to : Patient  Discharge Placement                         Discharge Plan and Services Additional resources added to the After Visit Summary for   In-house Referral: Clinical Social Work Discharge Planning Services: CM Consult                      HH Arranged: Refused HH          Social Determinants of Health (SDOH) Interventions SDOH Screenings   Food Insecurity: No Food Insecurity (11/04/2022)  Housing: Low Risk  (11/04/2022)  Transportation Needs: No Transportation Needs (11/04/2022)  Utilities: Not At Risk (11/04/2022)  Depression (PHQ2-9): Low Risk  (12/25/2021)  Financial Resource Strain: Low Risk  (02/20/2019)  Physical Activity: Unknown (02/20/2019)  Social Connections: Unknown (02/20/2019)  Stress: No Stress Concern Present (02/20/2019)  Tobacco Use: Low Risk  (11/04/2022)     Readmission Risk Interventions    11/04/2022    1:04 PM 05/15/2021   11:21 AM  Readmission Risk Prevention Plan  Transportation Screening Complete Complete  Medication Review Press photographer) Complete Complete  HRI or [place] Complete Complete  SW Recovery Care/Counseling Consult Complete Complete  Palliative Care Screening Not  Applicable Not [place] Not Applicable Not Applicable

## 2022-11-05 NOTE — Evaluation (Signed)
Physical Therapy Evaluation Patient Details Name: Kelly Key MRN: [id number] DOB: [birth year] Today's Date: 11/05/2022  History of Present Illness  Rein Kelly Key is a 52 year old male with a history of ESRD on HD TTS [facility], combined D/S CHF, COPD, DM2, HTN, HLD,... Last hospitalization for acute supraglottitis, dysphagia (discharged 10/18/2022)...  Presenting today with shortness of breath.  Patient was apparently at dialysis where he became shortness of breath, was unable to continue, productive cough for past 1-2 days, cold sweats with nausea.  Patient was placed on CPAP increased work of breathing Rales..  Was noted to be in hypertensive state with a systolic blood pressure> 867.   Clinical Impression  Patient functioning near baseline for functional mobility and gait demonstrating good return for bed mobility, transfers and ambulating in room without loss of balance without need for an AD.  Patient tolerated sitting up in chair after therapy.  Plan:  Patient discharged from physical therapy to care of nursing for ambulation daily as tolerated for length of stay.          Recommendations for follow up therapy are one component of a multi-disciplinary discharge planning process, led by the attending physician.  Recommendations may be updated based on patient status, additional functional criteria and insurance authorization.  Follow Up Recommendations Home health PT      Assistance Recommended at Discharge Set up Supervision/Assistance  Patient can return home with the following  A little help with walking and/or transfers;A little help with bathing/dressing/bathroom;Help with stairs or ramp for entrance;Assistance with cooking/housework    Equipment Recommendations None recommended by PT  Recommendations for Other Services       Functional Status Assessment Patient has had a recent decline in their functional status and demonstrates the ability to make significant improvements  in function in a reasonable and predictable amount of time.     Precautions / Restrictions Precautions Precautions: Fall Restrictions Weight Bearing Restrictions: No      Mobility  Bed Mobility Overal bed mobility: Independent                  Transfers Overall transfer level: Modified independent                      Ambulation/Gait Ambulation/Gait assistance: Modified independent (Device/Increase time) Gait Distance (Feet): 30 Feet Assistive device: None Gait Pattern/deviations: Decreased step length - left, Decreased stride length, Decreased dorsiflexion - right Gait velocity: decreased     General Gait Details: slightly labored cadence without loss of balance without need for use of an AD, limited mostly due to c/o fatigue  Stairs            Wheelchair Mobility    Modified Rankin (Stroke Patients Only)       Balance Overall balance assessment: Mild deficits observed, not formally tested                                           Pertinent Vitals/Pain Pain Assessment Pain Assessment: No/denies pain    Home Living Family/patient expects to be discharged to:: Private residence Living Arrangements: Other (Comment) Available Help at Discharge: Friend(s);Available PRN/intermittently Type of Home: House Home Access: Level entry       Home Layout: One level Home Equipment: Conservation officer, nature (2 wheels);Cane - quad;Shower seat;Wheelchair - manual      Prior Function Prior Level of  Function : Needs assist       Physical Assist : ADLs (physical)   ADLs (physical): IADLs;Dressing;Bathing Mobility Comments: [place] ambulator without AD. ADLs Comments: Pt reports that his fiance helps him with lower body dressing and bathing. Able to compelte all other ADL's himself. Assisted for IADL's.     Hand Dominance   Dominant Hand: Right    Extremity/Trunk Assessment   Upper Extremity Assessment Upper Extremity Assessment:  Defer to OT evaluation    Lower Extremity Assessment Lower Extremity Assessment: Overall WFL for tasks assessed    Cervical / Trunk Assessment Cervical / Trunk Assessment: Normal  Communication   Communication: No difficulties  Cognition Arousal/Alertness: Awake/alert Behavior During Therapy: WFL for tasks assessed/performed Overall Cognitive Status: Within Functional Limits for tasks assessed                                          General Comments      Exercises     Assessment/Plan    PT Assessment All further PT needs can be met in the next venue of care  PT Problem List Decreased strength;Decreased activity tolerance;Decreased mobility;Decreased balance       PT Treatment Interventions      PT Goals (Current goals can be found in the Care Plan section)  Acute Rehab PT Goals Patient Stated Goal: return home with family to assist PT Goal Formulation: With patient Time For Goal Achievement: 11/05/22 Potential to Achieve Goals: Good    Frequency       Co-evaluation PT/OT/SLP Co-Evaluation/Treatment: Yes Reason for Co-Treatment: To address functional/ADL transfers PT goals addressed during session: Mobility/safety with mobility;Balance         AM-PAC PT "6 Clicks" Mobility  Outcome Measure Help needed turning from your back to your side while in a flat bed without using bedrails?: None Help needed moving from lying on your back to sitting on the side of a flat bed without using bedrails?: None Help needed moving to and from a bed to a chair (including a wheelchair)?: None Help needed standing up from a chair using your arms (e.g., wheelchair or bedside chair)?: None Help needed to walk in hospital room?: A Little Help needed climbing 3-5 steps with a railing? : A Lot 6 Click Score: 21    End of Session   Activity Tolerance: Patient tolerated treatment well;Patient limited by fatigue Patient left: in chair;with call bell/phone within  reach Nurse Communication: Mobility status PT Visit Diagnosis: Unsteadiness on feet (R26.81);Other abnormalities of gait and mobility (R26.89);Muscle weakness (generalized) (M62.81)    Time: 2952-8413 PT Time Calculation (min) (ACUTE ONLY): 29 min   Charges:   PT Evaluation $PT Eval Moderate Complexity: 1 Mod PT Treatments $Therapeutic Activity: 23-37 mins        1:58 PM, 11/05/22 Lonell Grandchild, MPT Physical Therapist with [facility] 336 [phone] office [phone] mobile phone

## 2022-11-05 NOTE — Progress Notes (Addendum)
Patient ID: Kelly Key, male   DOB: [birth year], 52 y.o.   MRN: [id number]  [facility] Progress Note    Subjective:    Tolerated HD well yesterday with UF of 4 Liters.  Currently off of BiPAP   Objective:   BP 135/79   Pulse 90   Temp 98.3 F (36.8 C) (Oral)   Resp 14   Ht 5\' 9"  (1.753 m)   Wt (!) 141.7 kg   SpO2 91%   BMI 46.13 kg/m   Intake/Output: I/O last 3 completed shifts: In: -  Out: 4300 [Urine:300; Other:4000]   Intake/Output this shift:  No intake/output data recorded. Weight change:   Physical Exam: SWF:UXNATFT in chair in NAD   Labs: BMET Recent Labs  Lab 11/04/22 0859 11/04/22 1119 11/05/22 0418  NA 134*  --  131*  K 5.0  --  5.2*  CL 93*  --  91*  CO2 27  --  26  GLUCOSE 130*  --  271*  BUN 61*  --  55*  CREATININE 10.56*  --  8.82*  ALBUMIN 3.7  --   --   CALCIUM 8.7*  --  8.3*  PHOS  --  4.8*  --    CBC Recent Labs  Lab 11/04/22 1119 11/05/22 0418  WBC 8.0 7.9  HGB 9.3* 9.4*  HCT 29.4* 29.6*  MCV 89.9 89.2  PLT 191 216      Medications:     allopurinol  200 mg Oral Daily   amLODipine  5 mg Oral Daily   vitamin C  500 mg Oral Daily   aspirin EC  81 mg Oral Daily   carvedilol  25 mg Oral BID WC   Chlorhexidine Gluconate Cloth  6 each Topical Daily   chlorpheniramine-HYDROcodone  5 mL Oral Q12H   cloNIDine  0.3 mg Oral TID   dexamethasone (DECADRON) injection  6 mg Intravenous Daily   ezetimibe  10 mg Oral QPM   furosemide  80 mg Oral Daily   gabapentin  100 mg Oral TID   guaiFENesin-dextromethorphan  10 mL Oral Q8H   heparin  5,000 Units Subcutaneous Q8H   insulin aspart  0-20 Units Subcutaneous TID WC   insulin detemir  50 Units Subcutaneous QHS   Ipratropium-Albuterol  1 puff Inhalation Q6H   mupirocin ointment  1 Application Topical BID   nirmatrelvir/ritonavir (renal dosing)  2 tablet Oral BID   pantoprazole  40 mg Oral Daily   sevelamer carbonate  2,400 mg Oral TID WC   sodium chloride flush  3 mL  Intravenous Q12H   sodium chloride flush  3 mL Intravenous Q12H   zinc sulfate  220 mg Oral Daily   Outpatient Dialysis:  [facility], TTS EDW 139 kg, Time 4:15, 3K/2.5Ca, BFR 450, DFR 500, Access LUE AVF (15 gauge needles), Dialyzer Revaclear 400 KOA, heparin 2600 units IV bolus then 3200 units/hr  Micera 50 mcg IV every 4 weeks  Assessment/ Plan:   Acute hypoxic respiratory failure due to volume overload and Covid-19 infection - s/p HD with 4L UF and marked improvement of BP and respiratory status. ESRD- on HD TTS at [facility] via LUE AVF.  Continue with TTS schedule. Anemia:on outpatient ESA, cont to follow for now and may add ESA if remains admitted through the week. CKD-MBD: continue with home meds Nutrition:renal diet Hypertension:presented with HTN emergency - markedly improved with HD and UF and resumption of home meds. Disposition - per primary svc  Donetta Potts, MD [place] 11/05/2022, 9:52 AM

## 2022-11-05 NOTE — Evaluation (Signed)
Occupational Therapy Evaluation Patient Details Name: Kelly Key MRN: [id number] DOB: 26-Jun-1971 Today's Date: 11/05/2022   History of Present Illness Kelly Key is a 52 year old male with a history of ESRD on HD TTS [facility], combined D/S CHF, COPD, DM2, HTN, HLD,... Last hospitalization for acute supraglottitis, dysphagia (discharged 10/18/2022)...  Presenting today with shortness of breath.  Patient was apparently at dialysis where he became shortness of breath, was unable to continue, productive cough for past 1-2 days, cold sweats with nausea.  Patient was placed on CPAP increased work of breathing Rales..  Was noted to be in hypertensive state with a systolic blood pressure> 585.   Clinical Impression   Pt agreeable to OT and PT co-evaluation. Pt assisted for lower body ADL's at baseline. Today pt was able to ambulate in room and complete bed mobility without assist. Pt appears to be near baseline level of function and is not recommended for further acute OT services. Pt will be discharged to care of nursing staff for remaining length of stay.      Recommendations for follow up therapy are one component of a multi-disciplinary discharge planning process, led by the attending physician.  Recommendations may be updated based on patient status, additional functional criteria and insurance authorization.   Follow Up Recommendations  No OT follow up     Assistance Recommended at Discharge PRN  Patient can return home with the following A little help with bathing/dressing/bathroom;Assist for transportation;Assistance with cooking/housework    Functional Status Assessment  Patient has not had a recent decline in their functional status  Equipment Recommendations  None recommended by OT           Precautions / Restrictions Precautions Precautions: Fall Restrictions Weight Bearing Restrictions: No      Mobility Bed Mobility Overal bed mobility: Independent                   Transfers Overall transfer level: Modified independent                 General transfer comment: without AD      Balance Overall balance assessment: Mild deficits observed, not formally tested                                         ADL either performed or assessed with clinical judgement   ADL Overall ADL's : Needs assistance/impaired     Grooming: Modified independent;Standing   Upper Body Bathing: Modified independent;Sitting   Lower Body Bathing: Moderate assistance;Sitting/lateral leans       Lower Body Dressing: Moderate assistance;Sitting/lateral leans   Toilet Transfer: Modified Independent;Ambulation Toilet Transfer Details (indicate cue type and reason): Pt able to ambulate to toilet from chair and back without AD.         Functional mobility during ADLs: Modified independent       Vision Baseline Vision/History: 1 Wears glasses Ability to See in Adequate Light: 0 Adequate Patient Visual Report: No change from baseline Vision Assessment?: No apparent visual deficits                Pertinent Vitals/Pain Pain Assessment Pain Assessment: No/denies pain     Hand Dominance Right   Extremity/Trunk Assessment Upper Extremity Assessment Upper Extremity Assessment: Overall WFL for tasks assessed   Lower Extremity Assessment Lower Extremity Assessment: Defer to PT evaluation   Cervical / Trunk Assessment Cervical /  Trunk Assessment: Normal   Communication Communication Communication: No difficulties   Cognition Arousal/Alertness: Awake/alert Behavior During Therapy: WFL for tasks assessed/performed Overall Cognitive Status: Within Functional Limits for tasks assessed                                       General Comments  Pt noted to be bleeding from his toes. PT applied short term bandage and notified nursing.               Home Living Family/patient expects to be discharged to:: Private  residence Living Arrangements: Other (Comment) (fiance) Available Help at Discharge: Friend(s);Available PRN/intermittently Type of Home: House Home Access: Level entry     Home Layout: One level     Bathroom Shower/Tub: Teacher, early years/pre: Standard Bathroom Accessibility: No   Home Equipment: Conservation officer, nature (2 wheels);Cane - quad;Shower seat;Wheelchair - manual          Prior Functioning/Environment Prior Level of Function : Needs assist       Physical Assist : ADLs (physical)   ADLs (physical): IADLs;Dressing;Bathing Mobility Comments: [place] ambulator without AD. ADLs Comments: Pt reports that his fiance helps him with lower body dressing and bathing. Able to compelte all other ADL's himself. Assisted for IADL's.                                Co-evaluation PT/OT/SLP Co-Evaluation/Treatment: Yes Reason for Co-Treatment: To address functional/ADL transfers   OT goals addressed during session: ADL's and self-care                       End of Session    Activity Tolerance: Patient tolerated treatment well Patient left: in chair;with call bell/phone within reach  OT Visit Diagnosis: Unsteadiness on feet (R26.81);Other abnormalities of gait and mobility (R26.89)                Time: 0947-0962 OT Time Calculation (min): 26 min Charges:  OT General Charges $OT Visit: 1 Visit OT Evaluation $OT Eval Low Complexity: 1 Low  Cliffard Hair OT, MOT  Larey Seat 11/05/2022, 9:31 AM

## 2022-11-05 NOTE — Discharge Summary (Addendum)
Physician Discharge Summary  CHRISANGEL ESKENAZI VOH:[id number] DOB: 1971-05-05 DOA: 11/04/2022  PCP: [name], MD  Admit date: 11/04/2022  Discharge date: 11/05/2022  Admitted From:Home  Disposition:  Home  Recommendations for Outpatient Follow-up:  Follow up with PCP in 1-2 weeks Continue hemodialysis per routine schedule TTS, next hemodialysis 1/11 Continue home medications as prior Continue Paxlovid Continue breathing treatments as needed as well as Decadron taper  Home Health: None  Equipment/Devices: None  Discharge Condition:Stable  CODE STATUS: Full  Diet recommendation: Renal/carb modified  Brief/Interim Summary: Kelly Key is a 52 year old male with a history of ESRD on HD TTS [facility], combined D/S CHF, COPD, DM2, HTN, HLD,... Last hospitalization for acute supraglottitis, dysphagia (discharged 10/18/2022)... Presenting today with shortness of breath.  Patient was apparently at dialysis where he became shortness of breath, was unable to continue, productive cough for past 1-2 days, cold sweats with nausea.  Patient was placed on CPAP increased work of breathing [place]..  Was noted to be in hypertensive state with a systolic blood pressure> 694.  -Patient was admitted with acute hypoxemic respiratory failure in the setting of COVID-19 infection, but this was multifactorial with noted flash pulmonary edema and accelerated hypertension.  He was started on Paxlovid and IV Decadron empirically and underwent hemodialysis on 1/9 with improvements in blood pressure over as well as his shortness of breath.  He appears to be in improved condition today and stable for discharge with no significant respiratory symptoms otherwise noted.  He has been seen by nephrology with plans to continue further routine hemodialysis on 1/11.  Patient recovered faster than expected.  Discharge Diagnoses:  Principal Problem:   Acute respiratory failure due to COVID-19 [facility]) Active Problems:   Flash  pulmonary edema (HCC)   ESRD (end stage renal disease) (HCC)   Acute gout of right knee   Hepatic steatosis   Dyslipidemia   Chronic combined systolic and diastolic CHF (congestive heart failure) (HCC)   Volume overload   Physical debility   Anemia of chronic disease   Uncontrolled type 2 diabetes mellitus with hyperglycemia ([place])  Principal discharge diagnosis: Acute hypoxemic respiratory failure-multifactorial with COVID-19 infection as well as flash pulmonary edema in the setting of hypertensive urgency.  Discharge Instructions  Discharge Instructions     Diet - low sodium heart healthy   Complete by: As directed    Increase activity slowly   Complete by: As directed    No wound care   Complete by: As directed       Allergies as of 11/05/2022       Reactions   Hydralazine Anxiety   Hallucinations    Lisinopril Swelling        Medication List     TAKE these medications    Accu-Chek Aviva Plus test strip Generic drug: glucose blood Use as instructed TO CHECK BLOOD GLUCOSE THREE TIMES DAILY   acetaminophen 500 MG tablet Commonly known as: TYLENOL Take 1,000 mg by mouth every 6 (six) hours as needed for moderate pain or headache.   albuterol 108 (90 Base) MCG/ACT inhaler Commonly known as: VENTOLIN HFA Inhale 2 puffs into the lungs every 4 (four) hours as needed for wheezing or shortness of breath.   allopurinol 100 MG tablet Commonly known as: ZYLOPRIM Take 2 tablets (200 mg total) by mouth daily.   AMBULATORY NON FORMULARY MEDICATION Inject 0.2 ml by intracavernosal route as directed.  Medication Name: TriMix PGE 10 mcg Pap   30 mg Phent  1 mg   amLODipine 5 MG tablet Commonly known as: NORVASC Take 5 mg by mouth daily.   ammonium lactate 12 % cream Commonly known as: AMLACTIN Apply 1 Application topically in the morning and at bedtime.   ascorbic acid 500 MG tablet Commonly known as: VITAMIN C Take 1 tablet (500 mg total) by mouth  daily. Start taking on: November 06, 2022   aspirin EC 81 MG tablet Take 1 tablet (81 mg total) by mouth daily.   atorvastatin 80 MG tablet Commonly known as: LIPITOR Take 1 tablet (80 mg total) by mouth daily.   B-D ULTRAFINE III SHORT PEN 31G X 8 MM Misc Generic drug: Insulin Pen Needle 1 each by Does not apply route as directed.   calcium carbonate (dosed in mg elemental calcium) 1250 MG/5ML Susp Take 5 mLs (500 mg of elemental calcium total) by mouth every 6 (six) hours as needed for indigestion.   carvedilol 25 MG tablet Commonly known as: COREG Take 1 tablet (25 mg total) by mouth 2 (two) times daily with a meal.   cloNIDine 0.3 MG tablet Commonly known as: CATAPRES Take 0.3 mg by mouth 3 (three) times daily.   dexamethasone 6 MG tablet Commonly known as: DECADRON Take 1 tablet (6 mg total) by mouth daily for 5 days.   esomeprazole 20 MG capsule Commonly known as: NEXIUM Take 20 mg by mouth daily.   ezetimibe 10 MG tablet Commonly known as: ZETIA Take 10 mg by mouth every evening.   fluticasone 50 MCG/ACT nasal spray Commonly known as: FLONASE Place 1 spray into both nostrils daily as needed for allergies or rhinitis.   furosemide 80 MG tablet Commonly known as: LASIX Take 1 tablet (80 mg total) by mouth daily.   gabapentin 100 MG capsule Commonly known as: NEURONTIN Take 1 capsule (100 mg total) by mouth 3 (three) times daily.   guaiFENesin-dextromethorphan 100-10 MG/5ML syrup Commonly known as: ROBITUSSIN DM Take 10 mLs by mouth every 8 (eight) hours.   HumaLOG KwikPen 100 UNIT/ML KwikPen Generic drug: insulin lispro Inject 8-14 Units into the skin 3 (three) times daily.   hydrOXYzine 25 MG tablet Commonly known as: ATARAX Take 25 mg by mouth 3 (three) times daily as needed for anxiety.   Ipratropium-Albuterol 20-100 MCG/ACT Aers respimat Commonly known as: COMBIVENT Inhale 1 puff into the lungs every 6 (six) hours as needed for wheezing or  shortness of breath.   Levemir FlexTouch 100 UNIT/ML FlexPen Generic drug: insulin detemir INJECT 50 UNITS SUBCUTANEOUSLY AT BEDTIME What changed: See the new instructions.   linaclotide 145 MCG Caps capsule Commonly known as: LINZESS Take 1 capsule (145 mcg total) by mouth daily before breakfast.   multivitamin Tabs tablet Take 1 tablet by mouth at bedtime.   mupirocin ointment 2 % Commonly known as: BACTROBAN Apply 1 Application topically 2 (two) times daily.   nirmatrelvir/ritonavir (renal dosing) 10 x 150 MG & 10 x 100MG  Tabs Commonly known as: PAXLOVID Take 2 tablets by mouth 2 (two) times daily for 4 days. Patient GFR is 7 . Take nirmatrelvir (150 mg) one tablet twice daily for 5 days and ritonavir (100 mg) one tablet twice daily for 5 days.   Ozempic (1 MG/DOSE) 4 MG/3ML Sopn Generic drug: Semaglutide (1 MG/DOSE) Inject 1 mg into the skin once a week. Fridays.   sevelamer carbonate 800 MG tablet Commonly known as: RENVELA Take 3 tablets (2,400 mg total) by mouth 3 (three) times daily with meals.   vardenafil  20 MG tablet Commonly known as: LEVITRA Take 1 tablet (20 mg total) by mouth daily as needed for erectile dysfunction.   zinc sulfate 220 (50 Zn) MG capsule Take 1 capsule (220 mg total) by mouth daily. Start taking on: November 06, 2022        Follow-up Information     [name], MD. Schedule an appointment as soon as possible for a visit in 1 week(s).   Specialty: Internal Medicine Contact information: [place] [address] [phone]                Allergies  Allergen Reactions   Hydralazine Anxiety    Hallucinations    Lisinopril Swelling    Consultations: Nephrology   Procedures/Studies: DG Chest Portable 1 View  Result Date: 11/04/2022 CLINICAL DATA:  Dyspnea. EXAM: PORTABLE CHEST 1 VIEW COMPARISON:  September 20, 2022. FINDINGS: Stable cardiomediastinal silhouette. Increased bilateral diffuse  interstitial densities are noted most consistent with pulmonary edema. Bony thorax is unremarkable. IMPRESSION: Increased bilateral diffuse interstitial opacities are noted most consistent with pulmonary edema. Electronically Signed   By: Marijo Conception M.D.   On: 11/04/2022 08:27   CT Soft Tissue Neck W Contrast  Result Date: 10/18/2022 CLINICAL DATA:  Initial evaluation for throat edema, vomiting, difficulty swallowing. EXAM: CT NECK WITH CONTRAST TECHNIQUE: Multidetector CT imaging of the neck was performed using the standard protocol following the bolus administration of intravenous contrast. RADIATION DOSE REDUCTION: This exam was performed according to the departmental dose-optimization program which includes automated exposure control, adjustment of the mA and/or kV according to patient size and/or use of iterative reconstruction technique. CONTRAST:  85mL OMNIPAQUE IOHEXOL 300 MG/ML  SOLN COMPARISON:  None Available. FINDINGS: Pharynx and larynx: Oral cavity within normal limits. Few scattered dental caries noted. No acute inflammatory changes seen about the dentition. Palatine tonsils symmetric and within normal limits. Nasopharynx within normal limits. No retropharyngeal collection. There is mild swelling/edema involving the epiglottis and left greater than right aryepiglottic folds. Mucosal edema extends to involve the left pharynx/hypopharynx (series 3, images 61, 64). Findings consistent with acute supraglottitis. This could be either infectious or inflammatory/allergic in nature. Secondary narrowing and effacement of the airway just above the glottis (series 3, image 67). This may in part be related to phonation. Glottis otherwise symmetric and within normal limits. Subglottic airway widely patent clear. Salivary glands: Salivary glands including the parotid and submandibular glands are within normal limits. Thyroid: Normal. Lymph nodes: No enlarged or pathologic adenopathy seen within the neck.  Vascular: Normal intravascular enhancement seen throughout the neck. Moderate atheromatous change about the aortic arch, carotid bifurcations, and carotid siphons. Limited intracranial: Unremarkable. Visualized orbits: Unremarkable. Mastoids and visualized paranasal sinuses: Mild mucosal thickening noted about the floor of the right maxillary sinus. Visualized paranasal sinuses are otherwise largely clear. Mastoid air cells and middle ear cavities are well pneumatized and free of fluid. Skeleton: No discrete or worrisome osseous lesions. Probable tiny os odontoideum noted. Upper chest: Visualized upper chest demonstrates no acute finding. Other: None. IMPRESSION: 1. Mild edema about the epiglottis and aryepiglottic folds, consistent with acute supraglottitis. This could be either infectious or inflammatory/allergic in nature. Secondary narrowing and effacement of the airway just above the glottis. Close clinical monitoring recommended, as this patient is potentially at risk for airway compromise. 2. No other acute abnormality within the neck. Aortic Atherosclerosis (ICD10-I70.0). Electronically Signed   By: Jeannine Boga M.D.   On: 10/18/2022 01:28  Discharge Exam: Vitals:   11/05/22 0832 11/05/22 0900  BP:  135/79  Pulse: 80 90  Resp:    Temp:    SpO2: 96% 91%   Vitals:   11/05/22 0758 11/05/22 0800 11/05/22 0832 11/05/22 0900  BP: 133/73 133/73  135/79  Pulse: 74 75 80 90  Resp:      Temp: 98.3 F (36.8 C)     TempSrc: Oral     SpO2: 99% 98% 96% 91%  Weight:      Height:        General: Pt is alert, awake, not in acute distress Cardiovascular: RRR, S1/S2 +, no rubs, no gallops Respiratory: CTA bilaterally, no wheezing, no rhonchi Abdominal: Soft, NT, ND, bowel sounds + Extremities: no edema, no cyanosis    The results of significant diagnostics from this hospitalization (including imaging, microbiology, ancillary and laboratory) are listed below for reference.      Microbiology: Recent Results (from the past 240 hour(s))  Resp panel by RT-PCR (RSV, Flu A&B, Covid) Anterior Nasal Swab     Status: Abnormal   Collection Time: 11/04/22  8:23 AM   Specimen: Anterior Nasal Swab  Result Value Ref Range Status   SARS Coronavirus 2 by RT PCR POSITIVE (A) NEGATIVE Final    Comment: (NOTE) SARS-CoV-2 target nucleic acids are DETECTED.  The SARS-CoV-2 RNA is generally detectable in upper respiratory specimens during the acute phase of infection. Positive results are indicative of the presence of the identified virus, but do not rule out bacterial infection or co-infection with other pathogens not detected by the test. Clinical correlation with patient history and other diagnostic information is necessary to determine patient infection status. The expected result is Negative.  Fact Sheet for Patients: EntrepreneurPulse.com.au  Fact Sheet for Healthcare Providers: IncredibleEmployment.be  This test is not yet approved or cleared by the [place] FDA and  has been authorized for detection and/or diagnosis of SARS-CoV-2 by FDA under an Emergency Use Authorization (EUA).  This EUA will remain in effect (meaning this test can be used) for the duration of  the COVID-19 declaration under Section 564(b)(1) of the A ct, 21 U.S.C. section 360bbb-3(b)(1), unless the authorization is terminated or revoked sooner.     Influenza A by PCR NEGATIVE NEGATIVE Final   Influenza B by PCR NEGATIVE NEGATIVE Final    Comment: (NOTE) The Xpert Xpress SARS-CoV-2/FLU/RSV plus assay is intended as an aid in the diagnosis of influenza from Nasopharyngeal swab specimens and should not be used as a sole basis for treatment. Nasal washings and aspirates are unacceptable for Xpert Xpress SARS-CoV-2/FLU/RSV testing.  Fact Sheet for Patients: EntrepreneurPulse.com.au  Fact Sheet for Healthcare  Providers: IncredibleEmployment.be  This test is not yet approved or cleared by the [place] FDA and has been authorized for detection and/or diagnosis of SARS-CoV-2 by FDA under an Emergency Use Authorization (EUA). This EUA will remain in effect (meaning this test can be used) for the duration of the COVID-19 declaration under Section 564(b)(1) of the Act, 21 U.S.C. section 360bbb-3(b)(1), unless the authorization is terminated or revoked.     Resp Syncytial Virus by PCR NEGATIVE NEGATIVE Final    Comment: (NOTE) Fact Sheet for Patients: EntrepreneurPulse.com.au  Fact Sheet for Healthcare Providers: IncredibleEmployment.be  This test is not yet approved or cleared by the [place] FDA and has been authorized for detection and/or diagnosis of SARS-CoV-2 by FDA under an Emergency Use Authorization (EUA). This EUA will remain in effect (meaning this test can  be used) for the duration of the COVID-19 declaration under Section 564(b)(1) of the Act, 21 U.S.C. section 360bbb-3(b)(1), unless the authorization is terminated or revoked.  Performed at [facility], [address], [place], [place] [address]   MRSA Next Gen by PCR, Nasal     Status: Abnormal   Collection Time: 11/04/22  9:11 PM   Specimen: Nasal Mucosa; Nasal Swab  Result Value Ref Range Status   MRSA by PCR Next Gen DETECTED (A) NOT DETECTED Final    Comment: RESULT CALLED TO, READ BACK BY AND VERIFIED WITH: J.MIZE AT [id number] ON 01.10.24 BY ADGER J         The GeneXpert MRSA Assay (FDA approved for NASAL specimens only), is one component of a comprehensive MRSA colonization surveillance program. It is not intended to diagnose MRSA infection nor to guide or monitor treatment for MRSA infections. Performed at [facility], [address] [place], [place] [address]      Labs: BNP (last 3 results) Recent Labs    07/28/22 0234 11/04/22 0859  BNP  192.0* 818.2*   Basic Metabolic Panel: Recent Labs  Lab 11/04/22 0859 11/04/22 1119 11/05/22 0418  NA 134*  --  131*  K 5.0  --  5.2*  CL 93*  --  91*  CO2 27  --  26  GLUCOSE 130*  --  271*  BUN 61*  --  55*  CREATININE 10.56*  --  8.82*  CALCIUM 8.7*  --  8.3*  MG  --  2.1  --   PHOS  --  4.8*  --    Liver Function Tests: Recent Labs  Lab 11/04/22 0859  AST 23  ALT 20  ALKPHOS 69  BILITOT 0.8  PROT 7.9  ALBUMIN 3.7   No results for input(s): "LIPASE", "AMYLASE" in the last 168 hours. No results for input(s): "AMMONIA" in the last 168 hours. CBC: Recent Labs  Lab 11/04/22 1119 11/05/22 0418  WBC 8.0 7.9  HGB 9.3* 9.4*  HCT 29.4* 29.6*  MCV 89.9 89.2  PLT 191 216   Cardiac Enzymes: No results for input(s): "CKTOTAL", "CKMB", "CKMBINDEX", "TROPONINI" in the last 168 hours. BNP: Invalid input(s): "POCBNP" CBG: Recent Labs  Lab 11/04/22 1139 11/04/22 1833 11/04/22 2310 11/05/22 0742  GLUCAP 142* 261* 381* 199*   D-Dimer Recent Labs    11/04/22 1119  DDIMER 0.46   Hgb A1c No results for input(s): "HGBA1C" in the last 72 hours. Lipid Profile No results for input(s): "CHOL", "HDL", "LDLCALC", "TRIG", "CHOLHDL", "LDLDIRECT" in the last 72 hours. Thyroid function studies No results for input(s): "TSH", "T4TOTAL", "T3FREE", "THYROIDAB" in the last 72 hours.  Invalid input(s): "FREET3" Anemia work up No results for input(s): "VITAMINB12", "FOLATE", "FERRITIN", "TIBC", "IRON", "RETICCTPCT" in the last 72 hours. Urinalysis    Component Value Date/Time   COLORURINE STRAW (A) 04/20/2021 1740   APPEARANCEUR CLEAR 04/20/2021 1740   LABSPEC 1.011 04/20/2021 1740   PHURINE 8.0 04/20/2021 1740   GLUCOSEU 150 (A) 04/20/2021 1740   HGBUR NEGATIVE 04/20/2021 1740   BILIRUBINUR NEGATIVE 04/20/2021 1740   KETONESUR NEGATIVE 04/20/2021 1740   PROTEINUR 100 (A) 04/20/2021 1740   UROBILINOGEN 0.2 06/11/2012 0518   NITRITE NEGATIVE 04/20/2021 1740    LEUKOCYTESUR NEGATIVE 04/20/2021 1740   Sepsis Labs Recent Labs  Lab 11/04/22 1119 11/05/22 0418  WBC 8.0 7.9   Microbiology Recent Results (from the past 240 hour(s))  Resp panel by RT-PCR (RSV, Flu A&B, Covid) Anterior Nasal Swab  Status: Abnormal   Collection Time: 11/04/22  8:23 AM   Specimen: Anterior Nasal Swab  Result Value Ref Range Status   SARS Coronavirus 2 by RT PCR POSITIVE (A) NEGATIVE Final    Comment: (NOTE) SARS-CoV-2 target nucleic acids are DETECTED.  The SARS-CoV-2 RNA is generally detectable in upper respiratory specimens during the acute phase of infection. Positive results are indicative of the presence of the identified virus, but do not rule out bacterial infection or co-infection with other pathogens not detected by the test. Clinical correlation with patient history and other diagnostic information is necessary to determine patient infection status. The expected result is Negative.  Fact Sheet for Patients: EntrepreneurPulse.com.au  Fact Sheet for Healthcare Providers: IncredibleEmployment.be  This test is not yet approved or cleared by the [place] FDA and  has been authorized for detection and/or diagnosis of SARS-CoV-2 by FDA under an Emergency Use Authorization (EUA).  This EUA will remain in effect (meaning this test can be used) for the duration of  the COVID-19 declaration under Section 564(b)(1) of the A ct, 21 U.S.C. section 360bbb-3(b)(1), unless the authorization is terminated or revoked sooner.     Influenza A by PCR NEGATIVE NEGATIVE Final   Influenza B by PCR NEGATIVE NEGATIVE Final    Comment: (NOTE) The Xpert Xpress SARS-CoV-2/FLU/RSV plus assay is intended as an aid in the diagnosis of influenza from Nasopharyngeal swab specimens and should not be used as a sole basis for treatment. Nasal washings and aspirates are unacceptable for Xpert Xpress SARS-CoV-2/FLU/RSV testing.  Fact  Sheet for Patients: EntrepreneurPulse.com.au  Fact Sheet for Healthcare Providers: IncredibleEmployment.be  This test is not yet approved or cleared by the [place] FDA and has been authorized for detection and/or diagnosis of SARS-CoV-2 by FDA under an Emergency Use Authorization (EUA). This EUA will remain in effect (meaning this test can be used) for the duration of the COVID-19 declaration under Section 564(b)(1) of the Act, 21 U.S.C. section 360bbb-3(b)(1), unless the authorization is terminated or revoked.     Resp Syncytial Virus by PCR NEGATIVE NEGATIVE Final    Comment: (NOTE) Fact Sheet for Patients: EntrepreneurPulse.com.au  Fact Sheet for Healthcare Providers: IncredibleEmployment.be  This test is not yet approved or cleared by the [place] FDA and has been authorized for detection and/or diagnosis of SARS-CoV-2 by FDA under an Emergency Use Authorization (EUA). This EUA will remain in effect (meaning this test can be used) for the duration of the COVID-19 declaration under Section 564(b)(1) of the Act, 21 U.S.C. section 360bbb-3(b)(1), unless the authorization is terminated or revoked.  Performed at [facility], [address], [place], [place] [address]   MRSA Next Gen by PCR, Nasal     Status: Abnormal   Collection Time: 11/04/22  9:11 PM   Specimen: Nasal Mucosa; Nasal Swab  Result Value Ref Range Status   MRSA by PCR Next Gen DETECTED (A) NOT DETECTED Final    Comment: RESULT CALLED TO, READ BACK BY AND VERIFIED WITH: J.MIZE AT [id number] ON 01.10.24 BY ADGER J         The GeneXpert MRSA Assay (FDA approved for NASAL specimens only), is one component of a comprehensive MRSA colonization surveillance program. It is not intended to diagnose MRSA infection nor to guide or monitor treatment for MRSA infections. Performed at [facility], [address], [place], [place] [address]       Time coordinating discharge: 35 minutes  SIGNED:   Rodena Goldmann, DO [facility]  11/05/2022, 10:45 AM  If 7PM-7AM, please contact night-coverage [email or web address]

## 2022-11-13 NOTE — Progress Notes (Shared)
[facility] Note  11/19/2022     CHIEF COMPLAINT Patient presents for No chief complaint on file.   HISTORY OF PRESENT ILLNESS: Kelly Key is a 52 y.o. male who presents to the clinic today for:     Referring physician: [name], MD [place],  [place] [address]  HISTORICAL INFORMATION:   Selected notes from the MEDICAL RECORD NUMBER Referred by Dr. Truman Hayward:  Ocular Hx- PMH-    CURRENT MEDICATIONS: No current outpatient medications on file. (Ophthalmic Drugs)   No current facility-administered medications for this visit. (Ophthalmic Drugs)   Current Outpatient Medications (Other)  Medication Sig   acetaminophen (TYLENOL) 500 MG tablet Take 1,000 mg by mouth every 6 (six) hours as needed for moderate pain or headache.   albuterol (VENTOLIN HFA) 108 (90 Base) MCG/ACT inhaler Inhale 2 puffs into the lungs every 4 (four) hours as needed for wheezing or shortness of breath.    allopurinol (ZYLOPRIM) 100 MG tablet Take 2 tablets (200 mg total) by mouth daily.   AMBULATORY NON FORMULARY MEDICATION Inject 0.2 ml by intracavernosal route as directed.  Medication Name: TriMix PGE 10 mcg Pap   30 mg Phent 1 mg   amLODipine (NORVASC) 5 MG tablet Take 5 mg by mouth daily.   ammonium lactate (AMLACTIN) 12 % cream Apply 1 Application topically in the morning and at bedtime.   ascorbic acid (VITAMIN C) 500 MG tablet Take 1 tablet (500 mg total) by mouth daily.   aspirin EC 81 MG EC tablet Take 1 tablet (81 mg total) by mouth daily.   atorvastatin (LIPITOR) 80 MG tablet Take 1 tablet (80 mg total) by mouth daily.   Calcium Carbonate Antacid (CALCIUM CARBONATE, DOSED IN MG ELEMENTAL CALCIUM,) 1250 MG/5ML SUSP Take 5 mLs (500 mg of elemental calcium total) by mouth every 6 (six) hours as needed for indigestion.   carvedilol (COREG) 25 MG tablet Take 1 tablet (25 mg total) by mouth 2 (two) times daily with a meal.   cloNIDine  (CATAPRES) 0.3 MG tablet Take 0.3 mg by mouth 3 (three) times daily.   esomeprazole (NEXIUM) 20 MG capsule Take 20 mg by mouth daily.   ezetimibe (ZETIA) 10 MG tablet Take 10 mg by mouth every evening.   fluticasone (FLONASE) 50 MCG/ACT nasal spray Place 1 spray into both nostrils daily as needed for allergies or rhinitis.   furosemide (LASIX) 80 MG tablet Take 1 tablet (80 mg total) by mouth daily.   gabapentin (NEURONTIN) 100 MG capsule Take 1 capsule (100 mg total) by mouth 3 (three) times daily.   glucose blood (ACCU-CHEK AVIVA PLUS) test strip Use as instructed TO CHECK BLOOD GLUCOSE THREE TIMES DAILY   guaiFENesin-dextromethorphan (ROBITUSSIN DM) 100-10 MG/5ML syrup Take 10 mLs by mouth every 8 (eight) hours.   HUMALOG KWIKPEN 100 UNIT/ML KwikPen Inject 8-14 Units into the skin 3 (three) times daily.   hydrOXYzine (ATARAX) 25 MG tablet Take 25 mg by mouth 3 (three) times daily as needed for anxiety.   Insulin Pen Needle (B-D ULTRAFINE III SHORT PEN) 31G X 8 MM MISC 1 each by Does not apply route as directed.   Ipratropium-Albuterol (COMBIVENT) 20-100 MCG/ACT AERS respimat Inhale 1 puff into the lungs every 6 (six) hours as needed for wheezing or shortness of breath.   LEVEMIR FLEXTOUCH 100 UNIT/ML FlexTouch Pen INJECT 50 UNITS SUBCUTANEOUSLY AT BEDTIME (Patient taking differently: Inject 50 Units into the skin at  bedtime.)   linaclotide (LINZESS) 145 MCG CAPS capsule Take 1 capsule (145 mcg total) by mouth daily before breakfast. (Patient not taking: Reported on 11/04/2022)   multivitamin (RENA-VIT) TABS tablet Take 1 tablet by mouth at bedtime.   mupirocin ointment (BACTROBAN) 2 % Apply 1 Application topically 2 (two) times daily.   OZEMPIC, 1 MG/DOSE, 4 MG/3ML SOPN Inject 1 mg into the skin once a week. Fridays.   sevelamer carbonate (RENVELA) 800 MG tablet Take 3 tablets (2,400 mg total) by mouth 3 (three) times daily with meals.   vardenafil (LEVITRA) 20 MG tablet Take 1 tablet (20 mg  total) by mouth daily as needed for erectile dysfunction.   zinc sulfate 220 (50 Zn) MG capsule Take 1 capsule (220 mg total) by mouth daily.   Current Facility-Administered Medications (Other)  Medication Route   0.9 %  sodium chloride infusion Intravenous   bebtelovimab EUA injection SOLN 175 mg Intravenous      REVIEW OF SYSTEMS:    ALLERGIES Allergies  Allergen Reactions   Hydralazine Anxiety    Hallucinations    Lisinopril Swelling    PAST MEDICAL HISTORY Past Medical History:  Diagnosis Date   Acute diastolic CHF (congestive heart failure) ([place]) 06/11/2012   EF 50-55% [facility])   Chronic kidney disease    COPD (chronic obstructive pulmonary disease) ([facility])    Diabetes mellitus    A1c 11.5 (06/11/2012).   Dyspnea    Gout    Hepatic steatosis 06/11/2012   Elevated LFTs   Hyperlipemia    Malignant hypertension    Microcytic anemia 06/12/2012   Obesity    Past Surgical History:  Procedure Laterality Date   AV FISTULA PLACEMENT Left 11/11/2019   Procedure: ARTERIOVENOUS (AV) FISTULA CREATION LEFT BRACHIOCEPHALIC ARM;  Surgeon: Serafina Mitchell, MD;  Location: [place];  Service: Vascular;  Laterality: Left;   COLONOSCOPY WITH PROPOFOL N/A 09/02/2021   Procedure: COLONOSCOPY WITH PROPOFOL;  Surgeon: Jonathon Bellows, MD;  Location: [facility] ENDOSCOPY;  Service: Gastroenterology;  Laterality: N/A;   IR FLUORO GUIDE CV LINE RIGHT  07/08/2019   IR REMOVAL TUN CV CATH W/O FL  03/23/2020   IR US GUIDE VASC ACCESS RIGHT  07/08/2019   LEFT HEART CATH AND CORONARY ANGIOGRAPHY N/A 06/12/2021   Procedure: LEFT HEART CATH AND CORONARY ANGIOGRAPHY;  Surgeon: Belva Crome, MD;  Location: [place] CV LAB;  Service: Cardiovascular;  Laterality: N/A;   NO PAST SURGERIES     VASCULAR SURGERY      FAMILY HISTORY Family History  Problem Relation Age of Onset   Gout Mother    Asthma Mother    Diabetes Father    Heart failure Father    Diabetes Sister    Hypertension Brother     Pancreatic cancer Brother    Diabetes Sister     SOCIAL HISTORY Social History   Tobacco Use   Smoking status: Never    Passive exposure: Past   Smokeless tobacco: Never  Vaping Use   Vaping Use: Never used  Substance Use Topics   Alcohol use: Not Currently    Comment: haven't drank in over 5 years    Drug use: No         OPHTHALMIC EXAM:  Not recorded     IMAGING AND PROCEDURES  Imaging and Procedures for 11/19/2022           ASSESSMENT/PLAN:  No diagnosis found.  1.  2.  3.  Ophthalmic Meds Ordered this visit:  No orders of the defined types were placed in this encounter.      No follow-ups on file.  There are no Patient Instructions on file for this visit.   Explained the diagnoses, plan, and follow up with the patient and they expressed understanding.  Patient expressed understanding of the importance of proper follow up care.   Gardiner Sleeper, M.D., Ph.D. Diseases & Surgery of the Retina and [place] @TODAY @     Abbreviations: M myopia (nearsighted); A astigmatism; H hyperopia (farsighted); P presbyopia; Mrx spectacle prescription;  CTL contact lenses; OD right eye; OS left eye; OU both eyes  XT exotropia; ET esotropia; PEK punctate epithelial keratitis; PEE punctate epithelial erosions; DES dry eye syndrome; MGD meibomian gland dysfunction; ATs artificial tears; PFAT's preservative free artificial tears; [place] nuclear sclerotic cataract; PSC posterior subcapsular cataract; ERM epi-retinal membrane; PVD posterior vitreous detachment; RD retinal detachment; DM diabetes mellitus; DR diabetic retinopathy; NPDR non-proliferative diabetic retinopathy; PDR proliferative diabetic retinopathy; CSME clinically significant macular edema; DME diabetic macular edema; dbh dot blot hemorrhages; CWS cotton wool spot; POAG primary open angle glaucoma; C/D cup-to-disc ratio; HVF humphrey visual field; GVF goldmann visual field;  OCT optical coherence tomography; IOP intraocular pressure; BRVO Branch retinal vein occlusion; CRVO central retinal vein occlusion; CRAO central retinal artery occlusion; BRAO branch retinal artery occlusion; RT retinal tear; SB scleral buckle; PPV pars plana vitrectomy; VH Vitreous hemorrhage; PRP panretinal laser photocoagulation; IVK intravitreal kenalog; VMT vitreomacular traction; MH Macular hole;  NVD neovascularization of the disc; NVE neovascularization elsewhere; AREDS age related eye disease study; ARMD age related macular degeneration; POAG primary open angle glaucoma; EBMD epithelial/anterior basement membrane dystrophy; ACIOL anterior chamber intraocular lens; IOL intraocular lens; PCIOL posterior chamber intraocular lens; Phaco/IOL phacoemulsification with intraocular lens placement; [place] photorefractive keratectomy; LASIK laser assisted in situ keratomileusis; HTN hypertension; DM diabetes mellitus; COPD chronic obstructive pulmonary disease

## 2022-11-19 ENCOUNTER — Encounter (INDEPENDENT_AMBULATORY_CARE_PROVIDER_SITE_OTHER): Payer: 59 | Admitting: Ophthalmology

## 2022-11-19 ENCOUNTER — Encounter (INDEPENDENT_AMBULATORY_CARE_PROVIDER_SITE_OTHER): Payer: Self-pay

## 2022-11-19 DIAGNOSIS — H3581 Retinal edema: Secondary | ICD-10-CM

## 2022-11-26 ENCOUNTER — Other Ambulatory Visit: Payer: Self-pay

## 2022-11-26 ENCOUNTER — Inpatient Hospital Stay (HOSPITAL_COMMUNITY)
Admission: EM | Admit: 2022-11-26 | Discharge: 2022-12-06 | DRG: 239 | Disposition: A | Discharge status: Home Health | Payer: 59 | Attending: Family Medicine | Admitting: Family Medicine

## 2022-11-26 ENCOUNTER — Encounter (HOSPITAL_COMMUNITY): Payer: Self-pay | Admitting: Emergency Medicine

## 2022-11-26 ENCOUNTER — Emergency Department (HOSPITAL_COMMUNITY): Payer: 59

## 2022-11-26 DIAGNOSIS — I96 Gangrene, not elsewhere classified: Secondary | ICD-10-CM | POA: Diagnosis present

## 2022-11-26 DIAGNOSIS — R0602 Shortness of breath: Secondary | ICD-10-CM

## 2022-11-26 DIAGNOSIS — E1152 Type 2 diabetes mellitus with diabetic peripheral angiopathy with gangrene: Secondary | ICD-10-CM | POA: Diagnosis not present

## 2022-11-26 DIAGNOSIS — U071 COVID-19: Secondary | ICD-10-CM | POA: Diagnosis present

## 2022-11-26 DIAGNOSIS — Z888 Allergy status to other drugs, medicaments and biological substances status: Secondary | ICD-10-CM

## 2022-11-26 DIAGNOSIS — R509 Fever, unspecified: Secondary | ICD-10-CM | POA: Diagnosis present

## 2022-11-26 DIAGNOSIS — Z6841 Body Mass Index (BMI) 40.0 and over, adult: Secondary | ICD-10-CM

## 2022-11-26 DIAGNOSIS — E669 Obesity, unspecified: Secondary | ICD-10-CM | POA: Diagnosis present

## 2022-11-26 DIAGNOSIS — Z825 Family history of asthma and other chronic lower respiratory diseases: Secondary | ICD-10-CM

## 2022-11-26 DIAGNOSIS — M869 Osteomyelitis, unspecified: Secondary | ICD-10-CM | POA: Diagnosis present

## 2022-11-26 DIAGNOSIS — I5042 Chronic combined systolic (congestive) and diastolic (congestive) heart failure: Secondary | ICD-10-CM | POA: Diagnosis present

## 2022-11-26 DIAGNOSIS — Z800 Family history of malignant neoplasm of digestive organs: Secondary | ICD-10-CM

## 2022-11-26 DIAGNOSIS — I70262 Atherosclerosis of native arteries of extremities with gangrene, left leg: Secondary | ICD-10-CM | POA: Diagnosis present

## 2022-11-26 DIAGNOSIS — Z7985 Long-term (current) use of injectable non-insulin antidiabetic drugs: Secondary | ICD-10-CM

## 2022-11-26 DIAGNOSIS — N186 End stage renal disease: Secondary | ICD-10-CM | POA: Diagnosis present

## 2022-11-26 DIAGNOSIS — J440 Chronic obstructive pulmonary disease with acute lower respiratory infection: Secondary | ICD-10-CM | POA: Diagnosis present

## 2022-11-26 DIAGNOSIS — D631 Anemia in chronic kidney disease: Secondary | ICD-10-CM | POA: Diagnosis present

## 2022-11-26 DIAGNOSIS — I739 Peripheral vascular disease, unspecified: Secondary | ICD-10-CM

## 2022-11-26 DIAGNOSIS — E119 Type 2 diabetes mellitus without complications: Secondary | ICD-10-CM | POA: Diagnosis present

## 2022-11-26 DIAGNOSIS — E875 Hyperkalemia: Secondary | ICD-10-CM | POA: Diagnosis present

## 2022-11-26 DIAGNOSIS — K760 Fatty (change of) liver, not elsewhere classified: Secondary | ICD-10-CM | POA: Diagnosis present

## 2022-11-26 DIAGNOSIS — J9601 Acute respiratory failure with hypoxia: Secondary | ICD-10-CM | POA: Diagnosis present

## 2022-11-26 DIAGNOSIS — E1165 Type 2 diabetes mellitus with hyperglycemia: Secondary | ICD-10-CM | POA: Diagnosis present

## 2022-11-26 DIAGNOSIS — N2581 Secondary hyperparathyroidism of renal origin: Secondary | ICD-10-CM | POA: Diagnosis present

## 2022-11-26 DIAGNOSIS — Z8616 Personal history of COVID-19: Secondary | ICD-10-CM | POA: Diagnosis present

## 2022-11-26 DIAGNOSIS — E1169 Type 2 diabetes mellitus with other specified complication: Principal | ICD-10-CM | POA: Diagnosis present

## 2022-11-26 DIAGNOSIS — E785 Hyperlipidemia, unspecified: Secondary | ICD-10-CM | POA: Diagnosis present

## 2022-11-26 DIAGNOSIS — Z7982 Long term (current) use of aspirin: Secondary | ICD-10-CM

## 2022-11-26 DIAGNOSIS — K219 Gastro-esophageal reflux disease without esophagitis: Secondary | ICD-10-CM | POA: Diagnosis present

## 2022-11-26 DIAGNOSIS — Z79899 Other long term (current) drug therapy: Secondary | ICD-10-CM

## 2022-11-26 DIAGNOSIS — Z833 Family history of diabetes mellitus: Secondary | ICD-10-CM

## 2022-11-26 DIAGNOSIS — I132 Hypertensive heart and chronic kidney disease with heart failure and with stage 5 chronic kidney disease, or end stage renal disease: Secondary | ICD-10-CM | POA: Diagnosis present

## 2022-11-26 DIAGNOSIS — E1122 Type 2 diabetes mellitus with diabetic chronic kidney disease: Secondary | ICD-10-CM | POA: Diagnosis present

## 2022-11-26 DIAGNOSIS — L02612 Cutaneous abscess of left foot: Secondary | ICD-10-CM | POA: Diagnosis present

## 2022-11-26 DIAGNOSIS — Z992 Dependence on renal dialysis: Secondary | ICD-10-CM

## 2022-11-26 DIAGNOSIS — Z794 Long term (current) use of insulin: Secondary | ICD-10-CM

## 2022-11-26 DIAGNOSIS — Z8249 Family history of ischemic heart disease and other diseases of the circulatory system: Secondary | ICD-10-CM

## 2022-11-26 LAB — CBC WITH DIFFERENTIAL/PLATELET
Abs Immature Granulocytes: 0.02 10*3/uL (ref 0.00–0.07)
Basophils Absolute: 0 10*3/uL (ref 0.0–0.1)
Basophils Relative: 0 %
Eosinophils Absolute: 0.1 10*3/uL (ref 0.0–0.5)
Eosinophils Relative: 2 %
HCT: 27.6 % — ABNORMAL LOW (ref 39.0–52.0)
Hemoglobin: 8.8 g/dL — ABNORMAL LOW (ref 13.0–17.0)
Immature Granulocytes: 0 %
Lymphocytes Relative: 9 %
Lymphs Abs: 0.6 10*3/uL — ABNORMAL LOW (ref 0.7–4.0)
MCH: 28.7 pg (ref 26.0–34.0)
MCHC: 31.9 g/dL (ref 30.0–36.0)
MCV: 89.9 fL (ref 80.0–100.0)
Monocytes Absolute: 0.2 10*3/uL (ref 0.1–1.0)
Monocytes Relative: 2 %
Neutro Abs: 6.3 10*3/uL (ref 1.7–7.7)
Neutrophils Relative %: 87 %
Platelets: 211 10*3/uL (ref 150–400)
RBC: 3.07 MIL/uL — ABNORMAL LOW (ref 4.22–5.81)
RDW: 15.1 % (ref 11.5–15.5)
WBC: 7.3 10*3/uL (ref 4.0–10.5)
nRBC: 0 % (ref 0.0–0.2)

## 2022-11-26 LAB — COMPREHENSIVE METABOLIC PANEL
ALT: 17 U/L (ref 0–44)
AST: 19 U/L (ref 15–41)
Albumin: 2.9 g/dL — ABNORMAL LOW (ref 3.5–5.0)
Alkaline Phosphatase: 73 U/L (ref 38–126)
Anion gap: 13 (ref 5–15)
BUN: 66 mg/dL — ABNORMAL HIGH (ref 6–20)
CO2: 26 mmol/L (ref 22–32)
Calcium: 8.2 mg/dL — ABNORMAL LOW (ref 8.9–10.3)
Chloride: 92 mmol/L — ABNORMAL LOW (ref 98–111)
Creatinine, Ser: 9.55 mg/dL — ABNORMAL HIGH (ref 0.61–1.24)
GFR, Estimated: 6 mL/min — ABNORMAL LOW (ref >60)
Glucose, Bld: 251 mg/dL — ABNORMAL HIGH (ref 70–99)
Potassium: 5.6 mmol/L — ABNORMAL HIGH (ref 3.5–5.1)
Sodium: 131 mmol/L — ABNORMAL LOW (ref 135–145)
Total Bilirubin: 0.6 mg/dL (ref 0.3–1.2)
Total Protein: 7.6 g/dL (ref 6.5–8.1)

## 2022-11-26 LAB — RESP PANEL BY RT-PCR (RSV, FLU A&B, COVID)  RVPGX2
Influenza A by PCR: NEGATIVE
Influenza B by PCR: NEGATIVE
Resp Syncytial Virus by PCR: NEGATIVE
SARS Coronavirus 2 by RT PCR: POSITIVE — AB

## 2022-11-26 LAB — TROPONIN I (HIGH SENSITIVITY)
Troponin I (High Sensitivity): 24 ng/L — ABNORMAL HIGH (ref <18)
Troponin I (High Sensitivity): 24 ng/L — ABNORMAL HIGH (ref <18)

## 2022-11-26 LAB — PROCALCITONIN: Procalcitonin: 17.97 ng/mL

## 2022-11-26 LAB — CBG MONITORING, ED: Glucose-Capillary: 219 mg/dL — ABNORMAL HIGH (ref 70–99)

## 2022-11-26 LAB — BRAIN NATRIURETIC PEPTIDE: B Natriuretic Peptide: 510 pg/mL — ABNORMAL HIGH (ref 0.0–100.0)

## 2022-11-26 LAB — GLUCOSE, CAPILLARY: Glucose-Capillary: 238 mg/dL — ABNORMAL HIGH (ref 70–99)

## 2022-11-26 MED ORDER — ASPIRIN 81 MG PO TBEC
81.0000 mg | DELAYED_RELEASE_TABLET | Freq: Every day | ORAL | Status: DC
  Administered 2022-11-27 – 2022-12-06 (×9): 81 mg via ORAL
  Filled 2022-11-26 (×9): qty 1

## 2022-11-26 MED ORDER — ALBUTEROL SULFATE HFA 108 (90 BASE) MCG/ACT IN AERS: 2.0000 | INHALATION_SPRAY | RESPIRATORY_TRACT | Status: DC | PRN

## 2022-11-26 MED ORDER — ZINC SULFATE 220 (50 ZN) MG PO CAPS
220.0000 mg | ORAL_CAPSULE | Freq: Every day | ORAL | Status: DC
  Administered 2022-11-27 – 2022-12-06 (×9): 220 mg via ORAL
  Filled 2022-11-26 (×9): qty 1

## 2022-11-26 MED ORDER — EZETIMIBE 10 MG PO TABS
10.0000 mg | ORAL_TABLET | Freq: Every evening | ORAL | Status: DC
  Administered 2022-11-27 – 2022-12-05 (×9): 10 mg via ORAL
  Filled 2022-11-26 (×9): qty 1

## 2022-11-26 MED ORDER — SEVELAMER CARBONATE 800 MG PO TABS
2400.0000 mg | ORAL_TABLET | Freq: Three times a day (TID) | ORAL | Status: DC
  Administered 2022-11-27 – 2022-12-06 (×22): 2400 mg via ORAL
  Filled 2022-11-26 (×23): qty 3

## 2022-11-26 MED ORDER — ONDANSETRON HCL 4 MG/2ML IJ SOLN
4.0000 mg | Freq: Four times a day (QID) | INTRAMUSCULAR | Status: DC | PRN
  Administered 2022-11-30 – 2022-12-02 (×2): 4 mg via INTRAVENOUS
  Filled 2022-11-26 (×3): qty 2

## 2022-11-26 MED ORDER — HEPARIN SODIUM (PORCINE) 5000 UNIT/ML IJ SOLN
5000.0000 [IU] | Freq: Three times a day (TID) | INTRAMUSCULAR | Status: DC
  Administered 2022-11-27 – 2022-12-06 (×28): 5000 [IU] via SUBCUTANEOUS
  Filled 2022-11-26 (×28): qty 1

## 2022-11-26 MED ORDER — ONDANSETRON HCL 4 MG PO TABS: 4.0000 mg | ORAL_TABLET | Freq: Four times a day (QID) | ORAL | Status: DC | PRN

## 2022-11-26 MED ORDER — INSULIN ASPART 100 UNIT/ML IJ SOLN
0.0000 [IU] | Freq: Three times a day (TID) | INTRAMUSCULAR | Status: DC
  Administered 2022-11-27 (×2): 3 [IU] via SUBCUTANEOUS
  Administered 2022-11-28: 2 [IU] via SUBCUTANEOUS
  Administered 2022-11-28: 5 [IU] via SUBCUTANEOUS
  Administered 2022-11-29: 2 [IU] via SUBCUTANEOUS
  Administered 2022-11-29: 3 [IU] via SUBCUTANEOUS
  Administered 2022-11-30 (×2): 2 [IU] via SUBCUTANEOUS
  Administered 2022-12-01: 5 [IU] via SUBCUTANEOUS
  Administered 2022-12-01: 3 [IU] via SUBCUTANEOUS
  Administered 2022-12-02: 2 [IU] via SUBCUTANEOUS
  Administered 2022-12-02: 5 [IU] via SUBCUTANEOUS
  Administered 2022-12-03: 11 [IU] via SUBCUTANEOUS
  Administered 2022-12-03 – 2022-12-04 (×2): 8 [IU] via SUBCUTANEOUS
  Administered 2022-12-04 – 2022-12-05 (×2): 3 [IU] via SUBCUTANEOUS
  Administered 2022-12-05: 5 [IU] via SUBCUTANEOUS
  Administered 2022-12-06: 3 [IU] via SUBCUTANEOUS

## 2022-11-26 MED ORDER — GUAIFENESIN-DM 100-10 MG/5ML PO SYRP
10.0000 mL | ORAL_SOLUTION | Freq: Three times a day (TID) | ORAL | Status: DC
  Administered 2022-11-26 – 2022-12-06 (×24): 10 mL via ORAL
  Filled 2022-11-26 (×27): qty 10

## 2022-11-26 MED ORDER — INSULIN ASPART 100 UNIT/ML IJ SOLN
0.0000 [IU] | Freq: Every day | INTRAMUSCULAR | Status: DC
  Administered 2022-11-26 – 2022-12-02 (×4): 2 [IU] via SUBCUTANEOUS
  Administered 2022-12-03: 5 [IU] via SUBCUTANEOUS
  Administered 2022-12-04: 3 [IU] via SUBCUTANEOUS

## 2022-11-26 MED ORDER — AMLODIPINE BESYLATE 5 MG PO TABS
5.0000 mg | ORAL_TABLET | Freq: Every day | ORAL | Status: DC
  Administered 2022-11-27 – 2022-11-29 (×3): 5 mg via ORAL
  Filled 2022-11-26 (×3): qty 1

## 2022-11-26 MED ORDER — ACETAMINOPHEN 650 MG RE SUPP: 650.0000 mg | Freq: Four times a day (QID) | RECTAL | Status: DC | PRN

## 2022-11-26 MED ORDER — GABAPENTIN 100 MG PO CAPS
100.0000 mg | ORAL_CAPSULE | Freq: Three times a day (TID) | ORAL | Status: DC
  Administered 2022-11-26 – 2022-12-06 (×24): 100 mg via ORAL
  Filled 2022-11-26 (×26): qty 1

## 2022-11-26 MED ORDER — ATORVASTATIN CALCIUM 80 MG PO TABS
80.0000 mg | ORAL_TABLET | Freq: Every day | ORAL | Status: DC
  Administered 2022-11-27 – 2022-12-06 (×9): 80 mg via ORAL
  Filled 2022-11-26 (×4): qty 1
  Filled 2022-11-26: qty 2
  Filled 2022-11-26 (×4): qty 1
  Filled 2022-11-26: qty 2

## 2022-11-26 MED ORDER — INSULIN DETEMIR 100 UNIT/ML ~~LOC~~ SOLN
40.0000 [IU] | Freq: Every day | SUBCUTANEOUS | Status: DC
  Administered 2022-11-26 – 2022-12-05 (×10): 40 [IU] via SUBCUTANEOUS
  Filled 2022-11-26 (×12): qty 0.4

## 2022-11-26 MED ORDER — VITAMIN C 500 MG PO TABS
500.0000 mg | ORAL_TABLET | Freq: Every day | ORAL | Status: DC
  Administered 2022-11-27 – 2022-12-02 (×5): 500 mg via ORAL
  Filled 2022-11-26 (×5): qty 1

## 2022-11-26 MED ORDER — IPRATROPIUM-ALBUTEROL 20-100 MCG/ACT IN AERS
1.0000 | INHALATION_SPRAY | Freq: Four times a day (QID) | RESPIRATORY_TRACT | Status: DC
  Administered 2022-11-27 (×3): 1 via RESPIRATORY_TRACT
  Filled 2022-11-26 (×2): qty 4

## 2022-11-26 MED ORDER — RENA-VITE PO TABS
1.0000 | ORAL_TABLET | Freq: Every day | ORAL | Status: DC
  Administered 2022-11-26 – 2022-12-05 (×9): 1 via ORAL
  Filled 2022-11-26 (×9): qty 1

## 2022-11-26 MED ORDER — ACETAMINOPHEN 325 MG PO TABS
650.0000 mg | ORAL_TABLET | Freq: Four times a day (QID) | ORAL | Status: DC | PRN
  Administered 2022-11-27 – 2022-11-28 (×3): 650 mg via ORAL
  Filled 2022-11-26 (×3): qty 2

## 2022-11-26 MED ORDER — SODIUM ZIRCONIUM CYCLOSILICATE 5 G PO PACK
10.0000 g | PACK | Freq: Once | ORAL | Status: AC
  Administered 2022-11-26: 10 g via ORAL
  Filled 2022-11-26: qty 2

## 2022-11-26 MED ORDER — CALCIUM CARBONATE ANTACID 1250 MG/5ML PO SUSP: 500.0000 mg | Freq: Four times a day (QID) | ORAL | Status: DC | PRN

## 2022-11-26 MED ORDER — FUROSEMIDE 40 MG PO TABS
80.0000 mg | ORAL_TABLET | Freq: Every day | ORAL | Status: DC
  Administered 2022-11-27 – 2022-11-28 (×2): 80 mg via ORAL
  Filled 2022-11-26 (×2): qty 2

## 2022-11-26 MED ORDER — CARVEDILOL 25 MG PO TABS
25.0000 mg | ORAL_TABLET | Freq: Two times a day (BID) | ORAL | Status: DC
  Administered 2022-11-27 – 2022-11-29 (×6): 25 mg via ORAL
  Filled 2022-11-26: qty 2
  Filled 2022-11-26: qty 1
  Filled 2022-11-26 (×2): qty 2
  Filled 2022-11-26: qty 1
  Filled 2022-11-26 (×2): qty 2

## 2022-11-26 MED ORDER — HYDROXYZINE HCL 25 MG PO TABS
25.0000 mg | ORAL_TABLET | Freq: Three times a day (TID) | ORAL | Status: DC | PRN
  Administered 2022-11-27: 25 mg via ORAL
  Filled 2022-11-26: qty 1

## 2022-11-26 MED ORDER — SEMAGLUTIDE (1 MG/DOSE) 4 MG/3ML ~~LOC~~ SOPN: 1.0000 mg | PEN_INJECTOR | SUBCUTANEOUS | Status: DC

## 2022-11-26 MED ORDER — OXYCODONE HCL 5 MG PO TABS
5.0000 mg | ORAL_TABLET | ORAL | Status: DC | PRN
  Administered 2022-11-26 – 2022-12-01 (×5): 5 mg via ORAL
  Filled 2022-11-26 (×4): qty 1

## 2022-11-26 MED ORDER — CLONIDINE HCL 0.2 MG PO TABS
0.3000 mg | ORAL_TABLET | Freq: Three times a day (TID) | ORAL | Status: DC
  Administered 2022-11-26 – 2022-11-28 (×6): 0.3 mg via ORAL
  Filled 2022-11-26 (×6): qty 1

## 2022-11-26 MED ORDER — PANTOPRAZOLE SODIUM 40 MG PO TBEC
40.0000 mg | DELAYED_RELEASE_TABLET | Freq: Every day | ORAL | Status: DC
  Administered 2022-11-27 – 2022-12-06 (×9): 40 mg via ORAL
  Filled 2022-11-26 (×9): qty 1

## 2022-11-26 NOTE — ED Notes (Signed)
Pt placed on 4L [place] per PA request.  Pt appears in no distress at this time

## 2022-11-26 NOTE — ED Notes (Signed)
Pt on RA

## 2022-11-26 NOTE — ED Notes (Signed)
Decrease [place] to 2L as pt seems to be feeling fine

## 2022-11-26 NOTE — ED Triage Notes (Signed)
Pt called ems for sob. Had covid x 2 weeks ago. Dialysis t/t/sat. Pt c/o sudden sob today x 1 hour and spitting while phlegm. Arrvied in nad. No resp distress at this time, on 10L nonrebreather, per ems pt was 83ra and tachypnea when they arrived. A/o.

## 2022-11-26 NOTE — ED Notes (Addendum)
Pt attempted to ambulate with NT. Pt was not able to make it to the room doorway before desat to 87% and feeling like going to pass out. Pt returned to bed where O2 sat returned to 90s on RA. EDPA made aware

## 2022-11-26 NOTE — ED Provider Notes (Signed)
[facility] Provider Note   CSN: [id number] Arrival date & time: 11/26/22  1754     History  Chief Complaint  Patient presents with   Shortness of Breath    HPI Kelly Key is a 52 y.o. male with ESRD, hypertension, type 2 diabetes, and COPD presenting for shortness of breath.  Started around noon today.  Patient was carrying a box of books to the back of his house and became short of breath.  Had to sit down to catch his breath.  Denies chest pain.  States he did have COVID 2 weeks ago.  Endorses nonproductive cough.  States he is also had some swelling in both legs and seem to have worsened in the last 3 days.  States he is up-to-date on his dialysis.  Dialysis is every Tuesday, Thursday and Saturday.   Shortness of Breath      Home Medications Prior to Admission medications   Medication Sig Start Date End Date Taking? Authorizing Provider  acetaminophen (TYLENOL) 500 MG tablet Take 1,000 mg by mouth every 6 (six) hours as needed for moderate pain or headache.    [provider]  albuterol (VENTOLIN HFA) 108 (90 Base) MCG/ACT inhaler Inhale 2 puffs into the lungs every 4 (four) hours as needed for wheezing or shortness of breath.  03/24/19   [provider]  allopurinol (ZYLOPRIM) 100 MG tablet Take 2 tablets (200 mg total) by mouth daily. 08/04/19   Love, Ivan Anchors, PA-C  AMBULATORY NON FORMULARY MEDICATION Inject 0.2 ml by intracavernosal route as directed.  Medication Name: TriMix PGE 10 mcg Pap   30 mg Phent 1 mg 01/01/22   Stoneking, Reece Leader., MD  amLODipine (NORVASC) 5 MG tablet Take 5 mg by mouth daily. 01/30/22   [provider]  ammonium lactate (AMLACTIN) 12 % cream Apply 1 Application topically in the morning and at bedtime. 09/08/22   [provider]  ascorbic acid (VITAMIN C) 500 MG tablet Take 1 tablet (500 mg total) by mouth daily. 11/06/22 12/06/22  Manuella Ghazi, Pratik D, DO  aspirin EC 81 MG  EC tablet Take 1 tablet (81 mg total) by mouth daily. 08/04/19   Love, Ivan Anchors, PA-C  atorvastatin (LIPITOR) 80 MG tablet Take 1 tablet (80 mg total) by mouth daily. 12/05/19   Arnoldo Lenis, MD  Calcium Carbonate Antacid (CALCIUM CARBONATE, DOSED IN MG ELEMENTAL CALCIUM,) 1250 MG/5ML SUSP Take 5 mLs (500 mg of elemental calcium total) by mouth every 6 (six) hours as needed for indigestion. 07/16/19   Amin, Jeanella Flattery, MD  carvedilol (COREG) 25 MG tablet Take 1 tablet (25 mg total) by mouth 2 (two) times daily with a meal. 08/03/19   Love, Ivan Anchors, PA-C  cloNIDine (CATAPRES) 0.3 MG tablet Take 0.3 mg by mouth 3 (three) times daily.    [provider]  esomeprazole (NEXIUM) 20 MG capsule Take 20 mg by mouth daily. 01/23/22   [provider]  ezetimibe (ZETIA) 10 MG tablet Take 10 mg by mouth every evening. 01/20/19   [provider]  fluticasone (FLONASE) 50 MCG/ACT nasal spray Place 1 spray into both nostrils daily as needed for allergies or rhinitis.    [provider]  furosemide (LASIX) 80 MG tablet Take 1 tablet (80 mg total) by mouth daily. 05/16/21   Shahmehdi, Valeria Batman, MD  gabapentin (NEURONTIN) 100 MG capsule Take 1 capsule (100 mg total) by mouth 3 (three) times daily. 08/03/19  Love, Pamela S, PA-C  glucose blood (ACCU-CHEK AVIVA PLUS) test strip Use as instructed TO CHECK BLOOD GLUCOSE THREE TIMES DAILY 08/15/20   Nida, Marella Chimes, MD  guaiFENesin-dextromethorphan (ROBITUSSIN DM) 100-10 MG/5ML syrup Take 10 mLs by mouth every 8 (eight) hours. 11/05/22   Manuella Ghazi, Pratik D, DO  HUMALOG KWIKPEN 100 UNIT/ML KwikPen Inject 8-14 Units into the skin 3 (three) times daily. 08/19/22   [provider]  hydrOXYzine (ATARAX) 25 MG tablet Take 25 mg by mouth 3 (three) times daily as needed for anxiety. 11/28/21   [provider]  Insulin Pen Needle (B-D ULTRAFINE III SHORT PEN) 31G X 8 MM MISC 1 each by Does not apply route as directed. 08/13/20    Cassandria Anger, MD  Ipratropium-Albuterol (COMBIVENT) 20-100 MCG/ACT AERS respimat Inhale 1 puff into the lungs every 6 (six) hours as needed for wheezing or shortness of breath. 11/05/22 12/05/22  Manuella Ghazi, Pratik D, DO  LEVEMIR FLEXTOUCH 100 UNIT/ML FlexTouch Pen INJECT 50 UNITS SUBCUTANEOUSLY AT BEDTIME Patient taking differently: Inject 50 Units into the skin at bedtime. 07/09/21   Brita Romp, NP  linaclotide Rolan Lipa) 145 MCG CAPS capsule Take 1 capsule (145 mcg total) by mouth daily before breakfast. Patient not taking: Reported on 11/04/2022 04/05/18   Heath Lark D, DO  multivitamin (RENA-VIT) TABS tablet Take 1 tablet by mouth at bedtime. 08/03/19   Love, Ivan Anchors, PA-C  mupirocin ointment (BACTROBAN) 2 % Apply 1 Application topically 2 (two) times daily.    [provider]  OZEMPIC, 1 MG/DOSE, 4 MG/3ML SOPN Inject 1 mg into the skin once a week. Fridays.    [provider]  sevelamer carbonate (RENVELA) 800 MG tablet Take 3 tablets (2,400 mg total) by mouth 3 (three) times daily with meals. 08/03/19   Love, Ivan Anchors, PA-C  vardenafil (LEVITRA) 20 MG tablet Take 1 tablet (20 mg total) by mouth daily as needed for erectile dysfunction. 08/16/21   Stoneking, Reece Leader., MD  zinc sulfate 220 (50 Zn) MG capsule Take 1 capsule (220 mg total) by mouth daily. 11/06/22 12/06/22  Manuella Ghazi, Pratik D, DO      Allergies    Ozempic (0.25 or 0.5 mg-dose) [semaglutide(0.25 or 0.5mg -dos)], Hydralazine, and Lisinopril    Review of Systems   Review of Systems  Respiratory:  Positive for shortness of breath.     Physical Exam   Vitals:   11/28/22 0618 11/28/22 0821  BP:    Pulse:    Resp:    Temp: 99.4 F (37.4 C)   SpO2:  94%    CONSTITUTIONAL:  ill-appearing, NAD NEURO:  Alert and oriented x 3, CN 3-12 grossly intact EYES:  eyes equal and reactive ENT/NECK:  Supple, no stridor  CARDIO:  regular rate and rhythm, appears well-perfused PULM:  No respiratory distress,  diminished breath sounds in bases GI/GU:  non-distended, soft MSK/SPINE:  No gross deformities, no edema, moves all extremities  SKIN:  no rash, atraumatic   *Additional and/or pertinent findings included in MDM below    ED Results / Procedures / Treatments   Labs (all labs ordered are listed, but only abnormal results are displayed) Labs Reviewed  RESP PANEL BY RT-PCR (RSV, FLU A&B, COVID)  RVPGX2 - Abnormal; Notable for the following components:      Result Value   SARS Coronavirus 2 by RT PCR POSITIVE (*)    All other components within normal limits  CBC WITH DIFFERENTIAL/PLATELET - Abnormal; Notable for the following  components:   RBC 3.07 (*)    Hemoglobin 8.8 (*)    HCT 27.6 (*)    Lymphs Abs 0.6 (*)    All other components within normal limits  COMPREHENSIVE METABOLIC PANEL - Abnormal; Notable for the following components:   Sodium 131 (*)    Potassium 5.6 (*)    Chloride 92 (*)    Glucose, Bld 251 (*)    BUN 66 (*)    Creatinine, Ser 9.55 (*)    Calcium 8.2 (*)    Albumin 2.9 (*)    GFR, Estimated 6 (*)    All other components within normal limits  BRAIN NATRIURETIC PEPTIDE - Abnormal; Notable for the following components:   B Natriuretic Peptide 510.0 (*)    All other components within normal limits  GLUCOSE, CAPILLARY - Abnormal; Notable for the following components:   Glucose-Capillary 238 (*)    All other components within normal limits  CBC WITH DIFFERENTIAL/PLATELET - Abnormal; Notable for the following components:   RBC 2.87 (*)    Hemoglobin 8.1 (*)    HCT 25.5 (*)    All other components within normal limits  COMPREHENSIVE METABOLIC PANEL - Abnormal; Notable for the following components:   Sodium 132 (*)    Chloride 92 (*)    Glucose, Bld 150 (*)    BUN 75 (*)    Creatinine, Ser 10.77 (*)    Calcium 8.1 (*)    Albumin 2.9 (*)    GFR, Estimated 5 (*)    Anion gap 16 (*)    All other components within normal limits  GLUCOSE, CAPILLARY -  Abnormal; Notable for the following components:   Glucose-Capillary 155 (*)    All other components within normal limits  GLUCOSE, CAPILLARY - Abnormal; Notable for the following components:   Glucose-Capillary 163 (*)    All other components within normal limits  GLUCOSE, CAPILLARY - Abnormal; Notable for the following components:   Glucose-Capillary 149 (*)    All other components within normal limits  RENAL FUNCTION PANEL - Abnormal; Notable for the following components:   Sodium 129 (*)    Chloride 93 (*)    Glucose, Bld 130 (*)    BUN 52 (*)    Creatinine, Ser 8.47 (*)    Calcium 8.1 (*)    Albumin 2.9 (*)    GFR, Estimated 7 (*)    All other components within normal limits  GLUCOSE, CAPILLARY - Abnormal; Notable for the following components:   Glucose-Capillary 232 (*)    All other components within normal limits  CBC WITH DIFFERENTIAL/PLATELET - Abnormal; Notable for the following components:   RBC 3.08 (*)    Hemoglobin 8.7 (*)    HCT 27.5 (*)    Monocytes Absolute 1.3 (*)    All other components within normal limits  GLUCOSE, CAPILLARY - Abnormal; Notable for the following components:   Glucose-Capillary 110 (*)    All other components within normal limits  SEDIMENTATION RATE - Abnormal; Notable for the following components:   Sed Rate 118 (*)    All other components within normal limits  C-REACTIVE PROTEIN - Abnormal; Notable for the following components:   CRP 17.0 (*)    All other components within normal limits  GLUCOSE, CAPILLARY - Abnormal; Notable for the following components:   Glucose-Capillary 147 (*)    All other components within normal limits  CBG MONITORING, ED - Abnormal; Notable for the following components:   Glucose-Capillary 219 (*)  All other components within normal limits  TROPONIN I (HIGH SENSITIVITY) - Abnormal; Notable for the following components:   Troponin I (High Sensitivity) 24 (*)    All other components within normal limits   TROPONIN I (HIGH SENSITIVITY) - Abnormal; Notable for the following components:   Troponin I (High Sensitivity) 24 (*)    All other components within normal limits  CULTURE, BLOOD (ROUTINE X 2)  CULTURE, BLOOD (ROUTINE X 2)  EXPECTORATED SPUTUM ASSESSMENT W GRAM STAIN, RFLX TO RESP C  AEROBIC CULTURE W GRAM STAIN (SUPERFICIAL SPECIMEN)  PROCALCITONIN  MAGNESIUM  HEPATITIS B SURFACE ANTIGEN  HEPATITIS B SURFACE ANTIBODY, QUANTITATIVE  PROCALCITONIN  PREALBUMIN  CBC WITH DIFFERENTIAL/PLATELET    EKG EKG Interpretation  Date/Time:  Wednesday November 26 2022 18:13:51 EST Ventricular Rate:  98 PR Interval:  205 QRS Duration: 97 QT Interval:  350 QTC Calculation: 447 R Axis:   157 Text Interpretation: Sinus rhythm Prolonged PR interval Right axis deviation Low voltage, precordial leads Probable anteroseptal infarct, old Confirmed by Fredia Sorrow [phone]) on 11/26/2022 6:42:14 PM  Radiology MR FOOT LEFT WO CONTRAST  Result Date: 11/28/2022 CLINICAL DATA:  Foot swelling, diabetic, osteomyelitis suspected. Concern for wet gangrene of the left 2nd toe. EXAM: MRI OF THE LEFT FOOT WITHOUT CONTRAST TECHNIQUE: Multiplanar, multisequence MR imaging of the left forefoot was performed. No intravenous contrast was administered. COMPARISON:  Previous MRI 11/06/2021. No comparison radiographs available. FINDINGS: Despite efforts by the technologist and patient, mild-to-moderate motion artifact is present on today's exam and could not be eliminated. This reduces exam sensitivity and specificity. Motion is greatest on the sagittal images which are essentially nondiagnostic. Bones/Joint/Cartilage Evaluation of the distal phalanges is limited by clawtoe deformities and the lack of diagnostic sagittal images. The distal 2nd phalanx appears abnormal with diffusely decreased T1 and increased T2 signal. In addition, there is probable cortical destruction. The proximal and middle phalanges demonstrate  nonspecific low-level marrow T2 hyperintensity without T1 signal abnormality. Presumed interval resection of the distal 3rd and 4th phalanges. No acute findings are seen within the great or small toes. There are degenerative changes at the 1st interphalangeal and metatarsophalangeal joints. No significant joint effusions. Ligaments The collateral ligaments at the metatarsophalangeal joints are intact. The Lisfranc ligament is intact. Muscles and Tendons Chronic generalized fatty atrophy throughout the forefoot musculature, similar to previous study. Mild tenosynovitis of the flexor tendon of the 2nd toe. The extensor tendons appear unremarkable. Soft tissues Nonspecific dorsal subcutaneous edema throughout the forefoot. There is generalized soft tissue swelling in the 2nd toe which appears greatest distally. Cannot exclude a small amount of soft tissue emphysema within the distal aspect of the 2nd toe. No focal fluid collection identified. IMPRESSION: 1. Examination is limited by motion, especially on the sagittal images. Plain film correlation and correlation with prior surgical history recommended. 2. The distal 2nd phalanx appears abnormal with probable cortical destruction and marrow edema, suspicious for osteomyelitis or osteonecrosis. 3. The proximal and middle phalanges of the 2nd toe demonstrate nonspecific low-level marrow T2 hyperintensity without T1 signal abnormality which could be reactive. 4. Diffuse soft tissue swelling throughout the 2nd toe consistent with soft tissue infection. Cannot exclude a small amount of soft tissue emphysema distally which could indicate necrotizing infection (gangrene). No focal fluid collection identified. Mild flexor tenosynovitis of the 2nd toe. 5. Presumed interval resection of the distal 3rd and 4th phalanges. Electronically Signed   By: Richardean Sale M.D.   On: 11/28/2022 13:02   US ARTERIAL  ABI (SCREENING LOWER EXTREMITY)  Result Date: 11/28/2022 CLINICAL DATA:   Toe gangrene EXAM: NONINVASIVE PHYSIOLOGIC VASCULAR STUDY OF BILATERAL LOWER EXTREMITIES TECHNIQUE: Evaluation of both lower extremities were performed at rest, including calculation of ankle-brachial indices with single level pressure measurements and doppler recording. COMPARISON:  None available. FINDINGS: Right ABI:  1.12 Left ABI:  0.89 Right Lower Extremity: Multiphasic waveform in posterior tibial artery. Dorsalis pedis is monophasic. Left Lower Extremity: Posterior tibial and dorsalis pedis are monophasic. 0.8-0.89 Mild PAD IMPRESSION: Mild left lower extremity PAD. Electronically Signed   By: Miachel Roux M.D.   On: 11/28/2022 10:08   DG CHEST PORT 1 VIEW  Result Date: 11/28/2022 CLINICAL DATA:  Dyspnea and fever, COVID infection EXAM: PORTABLE CHEST 1 VIEW COMPARISON:  11/26/2022 FINDINGS: Upper normal heart size for technique. Previous cephalization of blood flow in indistinct pulmonary vasculature has improved, currently with no compelling findings of pulmonary venous hypertension or edema. Atherosclerotic calcification of the aortic arch. No blunting of the costophrenic angles. Mild thoracic spondylosis. IMPRESSION: 1. Upper normal heart size for technique. No compelling findings of pulmonary venous hypertension or edema. 2. Mild thoracic spondylosis. Electronically Signed   By: Van Clines M.D.   On: 11/28/2022 07:36   DG Chest Port 1 View  Result Date: 11/26/2022 CLINICAL DATA:  Shortness of breath. EXAM: PORTABLE CHEST 1 VIEW COMPARISON:  11/04/2022 FINDINGS: Improved pulmonary edema from prior exam. There is mild vascular congestion. Mild cardiomegaly appears similar. Decreased bilateral pleural effusions. Mild bibasilar atelectasis without confluent airspace disease. No pneumothorax. IMPRESSION: 1. Improved pulmonary edema and pleural effusions from prior exam. Mild residual vascular congestion. Mild cardiomegaly is similar. 2. Mild bibasilar atelectasis. Electronically Signed   By:  Keith Rake M.D.   On: 11/26/2022 18:31    Procedures Procedures    Medications Ordered in ED Medications  aspirin EC tablet 81 mg (81 mg Oral Given 11/28/22 0949)  amLODipine (NORVASC) tablet 5 mg (5 mg Oral Given 11/28/22 0948)  atorvastatin (LIPITOR) tablet 80 mg (80 mg Oral Given 11/28/22 0948)  carvedilol (COREG) tablet 25 mg (25 mg Oral Given 11/28/22 0948)  cloNIDine (CATAPRES) tablet 0.3 mg (0.3 mg Oral Given 11/28/22 1405)  ezetimibe (ZETIA) tablet 10 mg (10 mg Oral Given 11/27/22 2048)  furosemide (LASIX) tablet 80 mg (80 mg Oral Given 11/28/22 0948)  hydrOXYzine (ATARAX) tablet 25 mg (25 mg Oral Given 11/27/22 0735)  calcium carbonate (dosed in mg elemental calcium) suspension 500 mg of elemental calcium (has no administration in time range)  pantoprazole (PROTONIX) EC tablet 40 mg (40 mg Oral Given 11/28/22 0948)  sevelamer carbonate (RENVELA) tablet 2,400 mg (2,400 mg Oral Given 11/28/22 1405)  gabapentin (NEURONTIN) capsule 100 mg (100 mg Oral Given 11/28/22 1404)  ascorbic acid (VITAMIN C) tablet 500 mg (500 mg Oral Given 11/28/22 0948)  multivitamin (RENA-VIT) tablet 1 tablet (1 tablet Oral Not Given 11/27/22 2045)  zinc sulfate capsule 220 mg (220 mg Oral Given 11/28/22 0948)  guaiFENesin-dextromethorphan (ROBITUSSIN DM) 100-10 MG/5ML syrup 10 mL (10 mLs Oral Given 11/28/22 1405)  albuterol (VENTOLIN HFA) 108 (90 Base) MCG/ACT inhaler 2 puff (has no administration in time range)  heparin injection 5,000 Units (5,000 Units Subcutaneous Given 11/28/22 1404)  acetaminophen (TYLENOL) tablet 650 mg (650 mg Oral Given 11/28/22 0520)    Or  acetaminophen (TYLENOL) suppository 650 mg ( Rectal See Alternative 11/28/22 0520)  oxyCODONE (Oxy IR/ROXICODONE) immediate release tablet 5 mg (5 mg Oral Given 11/26/22 2341)  ondansetron (ZOFRAN) tablet 4  mg (has no administration in time range)    Or  ondansetron (ZOFRAN) injection 4 mg (has no administration in time range)  insulin detemir (LEVEMIR) injection 40  Units (40 Units Subcutaneous Given 11/27/22 2258)  insulin aspart (novoLOG) injection 0-15 Units (2 Units Subcutaneous Given 11/28/22 1405)  insulin aspart (novoLOG) injection 0-5 Units (2 Units Subcutaneous Given 11/27/22 2259)  Oral care mouth rinse (has no administration in time range)  Chlorhexidine Gluconate Cloth 2 % PADS 6 each (6 each Topical Given 11/28/22 0514)  pentafluoroprop-tetrafluoroeth (GEBAUERS) aerosol 1 Application (has no administration in time range)  lidocaine (PF) (XYLOCAINE) 1 % injection 5 mL (has no administration in time range)  lidocaine-prilocaine (EMLA) cream 1 Application (has no administration in time range)  labetalol (NORMODYNE) injection 10 mg (has no administration in time range)  Ipratropium-Albuterol (COMBIVENT) respimat 1 puff (1 puff Inhalation Given 11/28/22 0821)  cefTRIAXone (ROCEPHIN) 2 g in sodium chloride 0.9 % 100 mL IVPB (2 g Intravenous New Bag/Given 11/28/22 1007)  metroNIDAZOLE (FLAGYL) tablet 500 mg (500 mg Oral Given 11/28/22 1001)  vancomycin (VANCOREADY) IVPB 2000 mg/400 mL (2,000 mg Intravenous New Bag/Given 11/28/22 1404)  calcitRIOL (ROCALTROL) capsule 2.5 mcg (has no administration in time range)  Chlorhexidine Gluconate Cloth 2 % PADS 6 each (has no administration in time range)  sodium zirconium cyclosilicate (LOKELMA) packet 10 g (10 g Oral Given 11/26/22 2152)    ED Course/ Medical Decision Making/ A&P                             Medical Decision Making Amount and/or Complexity of Data Reviewed Labs: ordered. Radiology: ordered.  Risk Prescription drug management. Decision regarding hospitalization.   Initial Impression and Ddx 52 year old male who is ill-appearing but otherwise hemodynamically stable presenting for shortness of breath.  Exam notable for diminished breath sounds at the bases of his lungs.  Differential diagnosis for this complaint includes volume overload, pneumonia, CHF exacerbation, URI, PE and ACS. Patient PMH that  increases complexity of ED encounter: ESRD, type 2 diabetes, hypertension  Interpretation of Diagnostics I independent reviewed and interpreted the labs as followed: Hyperkalemia, elevated BNP and elevated troponin, COVID-positive  - I independently visualized the following imaging with scope of interpretation limited to determining acute life threatening conditions related to emergency care: Chest x-ray revealed improved for edema and vascular congestion  - I Personally reviewed and interpreted EKG which revealed sinus rhythm  Patient Reassessment and Ultimate Disposition/Management Primarily concerned with possible volume overload but less likely given patient is up-to-date with his dialysis and x-ray showed evidence of improved pulmonary edema and vascular congestion.  Attempted to ambulate patient around the department but patient began to be progressively hypoxic and stating that his work of breathing was getting worse.  Patient is also COVID-positive.  Symptoms are likely consistent with hypoxia secondary to COVID infection.  Placed on oxygen by nasal cannula and sats improved. Treated hyperkalemia with Lokelma.  Admitted to hospital service.  Also considered ACS and CHF, PE.  Patient management required discussion with the following services or consulting groups:  Hospitalist Service  Complexity of Problems Addressed Acute complicated illness or Injury  Additional Data Reviewed and Analyzed Further history obtained from: Past medical history and medications listed in the EMR, Prior ED visit notes, and Recent PCP notes  Patient Encounter Risk Assessment Consideration of hospitalization         Final Clinical Impression(s) / ED Diagnoses Final diagnoses:  COVID  Shortness of breath    Rx / DC Orders ED Discharge Orders     None         Harriet Pho, PA-C 11/28/22 1434    Fredia Sorrow, MD 11/29/22 2009

## 2022-11-27 ENCOUNTER — Inpatient Hospital Stay (HOSPITAL_COMMUNITY): Payer: 59

## 2022-11-27 DIAGNOSIS — I5042 Chronic combined systolic (congestive) and diastolic (congestive) heart failure: Secondary | ICD-10-CM

## 2022-11-27 DIAGNOSIS — U071 COVID-19: Secondary | ICD-10-CM

## 2022-11-27 DIAGNOSIS — D631 Anemia in chronic kidney disease: Secondary | ICD-10-CM | POA: Diagnosis present

## 2022-11-27 DIAGNOSIS — E875 Hyperkalemia: Secondary | ICD-10-CM | POA: Insufficient documentation

## 2022-11-27 DIAGNOSIS — Z6841 Body Mass Index (BMI) 40.0 and over, adult: Secondary | ICD-10-CM | POA: Diagnosis not present

## 2022-11-27 DIAGNOSIS — N186 End stage renal disease: Secondary | ICD-10-CM

## 2022-11-27 DIAGNOSIS — K219 Gastro-esophageal reflux disease without esophagitis: Secondary | ICD-10-CM | POA: Diagnosis present

## 2022-11-27 DIAGNOSIS — Z8616 Personal history of COVID-19: Secondary | ICD-10-CM | POA: Diagnosis not present

## 2022-11-27 DIAGNOSIS — Z992 Dependence on renal dialysis: Secondary | ICD-10-CM | POA: Diagnosis not present

## 2022-11-27 DIAGNOSIS — E119 Type 2 diabetes mellitus without complications: Secondary | ICD-10-CM | POA: Diagnosis present

## 2022-11-27 DIAGNOSIS — K760 Fatty (change of) liver, not elsewhere classified: Secondary | ICD-10-CM | POA: Diagnosis present

## 2022-11-27 DIAGNOSIS — E1152 Type 2 diabetes mellitus with diabetic peripheral angiopathy with gangrene: Secondary | ICD-10-CM | POA: Diagnosis present

## 2022-11-27 DIAGNOSIS — E1165 Type 2 diabetes mellitus with hyperglycemia: Secondary | ICD-10-CM | POA: Diagnosis present

## 2022-11-27 DIAGNOSIS — I739 Peripheral vascular disease, unspecified: Secondary | ICD-10-CM | POA: Diagnosis not present

## 2022-11-27 DIAGNOSIS — I70262 Atherosclerosis of native arteries of extremities with gangrene, left leg: Secondary | ICD-10-CM | POA: Diagnosis present

## 2022-11-27 DIAGNOSIS — R509 Fever, unspecified: Secondary | ICD-10-CM

## 2022-11-27 DIAGNOSIS — Z79899 Other long term (current) drug therapy: Secondary | ICD-10-CM | POA: Diagnosis not present

## 2022-11-27 DIAGNOSIS — J440 Chronic obstructive pulmonary disease with acute lower respiratory infection: Secondary | ICD-10-CM | POA: Diagnosis present

## 2022-11-27 DIAGNOSIS — N189 Chronic kidney disease, unspecified: Secondary | ICD-10-CM | POA: Diagnosis not present

## 2022-11-27 DIAGNOSIS — M869 Osteomyelitis, unspecified: Secondary | ICD-10-CM | POA: Diagnosis present

## 2022-11-27 DIAGNOSIS — E785 Hyperlipidemia, unspecified: Secondary | ICD-10-CM | POA: Diagnosis present

## 2022-11-27 DIAGNOSIS — E1169 Type 2 diabetes mellitus with other specified complication: Secondary | ICD-10-CM | POA: Diagnosis present

## 2022-11-27 DIAGNOSIS — Z794 Long term (current) use of insulin: Secondary | ICD-10-CM | POA: Diagnosis not present

## 2022-11-27 DIAGNOSIS — J449 Chronic obstructive pulmonary disease, unspecified: Secondary | ICD-10-CM | POA: Diagnosis not present

## 2022-11-27 DIAGNOSIS — J9601 Acute respiratory failure with hypoxia: Secondary | ICD-10-CM

## 2022-11-27 DIAGNOSIS — E669 Obesity, unspecified: Secondary | ICD-10-CM

## 2022-11-27 DIAGNOSIS — I132 Hypertensive heart and chronic kidney disease with heart failure and with stage 5 chronic kidney disease, or end stage renal disease: Secondary | ICD-10-CM | POA: Diagnosis present

## 2022-11-27 DIAGNOSIS — I96 Gangrene, not elsewhere classified: Secondary | ICD-10-CM | POA: Diagnosis not present

## 2022-11-27 DIAGNOSIS — N2581 Secondary hyperparathyroidism of renal origin: Secondary | ICD-10-CM | POA: Diagnosis present

## 2022-11-27 DIAGNOSIS — R0602 Shortness of breath: Secondary | ICD-10-CM | POA: Diagnosis present

## 2022-11-27 DIAGNOSIS — E1122 Type 2 diabetes mellitus with diabetic chronic kidney disease: Secondary | ICD-10-CM | POA: Diagnosis present

## 2022-11-27 DIAGNOSIS — L02612 Cutaneous abscess of left foot: Secondary | ICD-10-CM | POA: Diagnosis present

## 2022-11-27 LAB — COMPREHENSIVE METABOLIC PANEL
ALT: 21 U/L (ref 0–44)
AST: 24 U/L (ref 15–41)
Albumin: 2.9 g/dL — ABNORMAL LOW (ref 3.5–5.0)
Alkaline Phosphatase: 60 U/L (ref 38–126)
Anion gap: 16 — ABNORMAL HIGH (ref 5–15)
BUN: 75 mg/dL — ABNORMAL HIGH (ref 6–20)
CO2: 24 mmol/L (ref 22–32)
Calcium: 8.1 mg/dL — ABNORMAL LOW (ref 8.9–10.3)
Chloride: 92 mmol/L — ABNORMAL LOW (ref 98–111)
Creatinine, Ser: 10.77 mg/dL — ABNORMAL HIGH (ref 0.61–1.24)
GFR, Estimated: 5 mL/min — ABNORMAL LOW (ref >60)
Glucose, Bld: 150 mg/dL — ABNORMAL HIGH (ref 70–99)
Potassium: 4.2 mmol/L (ref 3.5–5.1)
Sodium: 132 mmol/L — ABNORMAL LOW (ref 135–145)
Total Bilirubin: 0.7 mg/dL (ref 0.3–1.2)
Total Protein: 7.3 g/dL (ref 6.5–8.1)

## 2022-11-27 LAB — CBC WITH DIFFERENTIAL/PLATELET
Abs Immature Granulocytes: 0.03 10*3/uL (ref 0.00–0.07)
Basophils Absolute: 0 10*3/uL (ref 0.0–0.1)
Basophils Relative: 1 %
Eosinophils Absolute: 0.1 10*3/uL (ref 0.0–0.5)
Eosinophils Relative: 2 %
HCT: 25.5 % — ABNORMAL LOW (ref 39.0–52.0)
Hemoglobin: 8.1 g/dL — ABNORMAL LOW (ref 13.0–17.0)
Immature Granulocytes: 0 %
Lymphocytes Relative: 14 %
Lymphs Abs: 1.1 10*3/uL (ref 0.7–4.0)
MCH: 28.2 pg (ref 26.0–34.0)
MCHC: 31.8 g/dL (ref 30.0–36.0)
MCV: 88.9 fL (ref 80.0–100.0)
Monocytes Absolute: 1 10*3/uL (ref 0.1–1.0)
Monocytes Relative: 13 %
Neutro Abs: 5.6 10*3/uL (ref 1.7–7.7)
Neutrophils Relative %: 70 %
Platelets: 212 10*3/uL (ref 150–400)
RBC: 2.87 MIL/uL — ABNORMAL LOW (ref 4.22–5.81)
RDW: 14.9 % (ref 11.5–15.5)
WBC: 7.9 10*3/uL (ref 4.0–10.5)
nRBC: 0 % (ref 0.0–0.2)

## 2022-11-27 LAB — GLUCOSE, CAPILLARY
Glucose-Capillary: 155 mg/dL — ABNORMAL HIGH (ref 70–99)
Glucose-Capillary: 163 mg/dL — ABNORMAL HIGH (ref 70–99)
Glucose-Capillary: 149 mg/dL — ABNORMAL HIGH (ref 70–99)
Glucose-Capillary: 232 mg/dL — ABNORMAL HIGH (ref 70–99)

## 2022-11-27 LAB — MAGNESIUM: Magnesium: 2.3 mg/dL (ref 1.7–2.4)

## 2022-11-27 LAB — HEPATITIS B SURFACE ANTIGEN: Hepatitis B Surface Ag: NONREACTIVE

## 2022-11-27 MED ORDER — IPRATROPIUM-ALBUTEROL 20-100 MCG/ACT IN AERS
1.0000 | INHALATION_SPRAY | Freq: Two times a day (BID) | RESPIRATORY_TRACT | Status: DC
  Administered 2022-11-28 – 2022-11-30 (×4): 1 via RESPIRATORY_TRACT
  Filled 2022-11-27 (×2): qty 4

## 2022-11-27 MED ORDER — CHLORHEXIDINE GLUCONATE CLOTH 2 % EX PADS
6.0000 | MEDICATED_PAD | Freq: Every day | CUTANEOUS | Status: DC
  Administered 2022-11-27 – 2022-12-02 (×5): 6 via TOPICAL

## 2022-11-27 MED ORDER — ORAL CARE MOUTH RINSE: 15.0000 mL | OROMUCOSAL | Status: DC | PRN

## 2022-11-27 MED ORDER — LIDOCAINE HCL (PF) 1 % IJ SOLN: 5.0000 mL | INTRAMUSCULAR | Status: DC | PRN

## 2022-11-27 MED ORDER — LABETALOL HCL 5 MG/ML IV SOLN: 10.0000 mg | INTRAVENOUS | Status: DC | PRN

## 2022-11-27 MED ORDER — PENTAFLUOROPROP-TETRAFLUOROETH EX AERO: 1.0000 | INHALATION_SPRAY | CUTANEOUS | Status: DC | PRN

## 2022-11-27 MED ORDER — DOXYCYCLINE HYCLATE 100 MG PO TABS
100.0000 mg | ORAL_TABLET | Freq: Two times a day (BID) | ORAL | Status: DC
  Administered 2022-11-27 – 2022-11-28 (×2): 100 mg via ORAL
  Filled 2022-11-27 (×2): qty 1

## 2022-11-27 MED ORDER — LIDOCAINE-PRILOCAINE 2.5-2.5 % EX CREA: 1.0000 | TOPICAL_CREAM | CUTANEOUS | Status: DC | PRN

## 2022-11-27 NOTE — Assessment & Plan Note (Addendum)
-   Continue aspirin, statin, Coreg, home dose of Lasix - Not clinically in exacerbation - Volume management with hemodialysis

## 2022-11-27 NOTE — TOC Progression Note (Addendum)
Transition of Care [facility]) - Progression Note    Patient Details  Name: Kelly Key MRN: [id number] Date of Birth: 1971-01-20  [facility] CM/SW Contact  Boneta Lucks, RN Phone Number: 11/27/2022, 10:14 AM  Clinical Narrative:   Patient admitted in OBS for acute respiratory failure. He is TTS for HD. He will get HD here today. Planning to DC back home tomorrow.  TOC following.   Expected Discharge Plan: Home/Self Care Barriers to Discharge: Continued Medical Work up  Expected Discharge Plan and Services     Social Determinants of Health (SDOH) Interventions SDOH Screenings   Food Insecurity: No Food Insecurity (11/26/2022)  Housing: Low Risk  (11/26/2022)  Transportation Needs: No Transportation Needs (11/26/2022)  Utilities: Not At Risk (11/26/2022)  Depression (PHQ2-9): Low Risk  (12/25/2021)  Financial Resource Strain: Low Risk  (02/20/2019)  Physical Activity: Unknown (02/20/2019)  Social Connections: Unknown (02/20/2019)  Stress: No Stress Concern Present (02/20/2019)  Tobacco Use: Low Risk  (11/26/2022)    Readmission Risk Interventions    11/04/2022    1:04 PM 05/15/2021   11:21 AM  Readmission Risk Prevention Plan  Transportation Screening Complete Complete  Medication Review Press photographer) Complete Complete  HRI or [place] Complete Complete  SW Recovery Care/Counseling Consult Complete Complete  Palliative Care Screening Not Applicable Not [place] Not Applicable Not Applicable

## 2022-11-27 NOTE — Assessment & Plan Note (Addendum)
-   Initially positive on 11/04/2022 -No indication for treatment or isolation

## 2022-11-27 NOTE — Assessment & Plan Note (Signed)
-  Continue Protonix °

## 2022-11-27 NOTE — Plan of Care (Signed)
Consult rec'd and ordered HD this AM.  Contacted the HD RN this AM.  He had been obs status this AM on receipt of consult and now it appears is inpatient   Full consult note tomorrow   Claudia Desanctis, MD 2:58 PM 11/27/2022

## 2022-11-27 NOTE — Progress Notes (Signed)
ASSUMPTION OF CARE NOTE   11/27/2022 1:33 PM  Kelly Key was seen and examined.  The H&P by the admitting provider, orders, imaging was reviewed.  Please see new orders.  Will continue to follow. With elevated procalcitonin and fever 101,  I have ordered blood cultures.  I have started oral doxycycline 100 mg BID.  Recheck procalcitonin and CBC with diff in AM.    Vitals:   11/27/22 1121 11/27/22 1324  BP: 132/73 129/75  Pulse: 88 84  Resp: 20 20  Temp: (!) 101 F (38.3 C)   SpO2: 94% 93%    Results for orders placed or performed during the hospital encounter of 11/26/22  Resp panel by RT-PCR (RSV, Flu A&B, Covid) Anterior Nasal Swab   Specimen: Anterior Nasal Swab  Result Value Ref Range   SARS Coronavirus 2 by RT PCR POSITIVE (A) NEGATIVE   Influenza A by PCR NEGATIVE NEGATIVE   Influenza B by PCR NEGATIVE NEGATIVE   Resp Syncytial Virus by PCR NEGATIVE NEGATIVE  CBC with Differential  Result Value Ref Range   WBC 7.3 4.0 - 10.5 K/uL   RBC 3.07 (L) 4.22 - 5.81 MIL/uL   Hemoglobin 8.8 (L) 13.0 - 17.0 g/dL   HCT 27.6 (L) 39.0 - 52.0 %   MCV 89.9 80.0 - 100.0 fL   MCH 28.7 26.0 - 34.0 pg   MCHC 31.9 30.0 - 36.0 g/dL   RDW 15.1 11.5 - 15.5 %   Platelets 211 150 - 400 K/uL   nRBC 0.0 0.0 - 0.2 %   Neutrophils Relative % 87 %   Neutro Abs 6.3 1.7 - 7.7 K/uL   Lymphocytes Relative 9 %   Lymphs Abs 0.6 (L) 0.7 - 4.0 K/uL   Monocytes Relative 2 %   Monocytes Absolute 0.2 0.1 - 1.0 K/uL   Eosinophils Relative 2 %   Eosinophils Absolute 0.1 0.0 - 0.5 K/uL   Basophils Relative 0 %   Basophils Absolute 0.0 0.0 - 0.1 K/uL   Immature Granulocytes 0 %   Abs Immature Granulocytes 0.02 0.00 - 0.07 K/uL  Comprehensive metabolic panel  Result Value Ref Range   Sodium 131 (L) 135 - 145 mmol/L   Potassium 5.6 (H) 3.5 - 5.1 mmol/L   Chloride 92 (L) 98 - 111 mmol/L   CO2 26 22 - 32 mmol/L   Glucose, Bld 251 (H) 70 - 99 mg/dL   BUN 66 (H) 6 - 20 mg/dL   Creatinine, Ser 9.55 (H) 0.61  - 1.24 mg/dL   Calcium 8.2 (L) 8.9 - 10.3 mg/dL   Total Protein 7.6 6.5 - 8.1 g/dL   Albumin 2.9 (L) 3.5 - 5.0 g/dL   AST 19 15 - 41 U/L   ALT 17 0 - 44 U/L   Alkaline Phosphatase 73 38 - 126 U/L   Total Bilirubin 0.6 0.3 - 1.2 mg/dL   GFR, Estimated 6 (L) >60 mL/min   Anion gap 13 5 - 15  Brain natriuretic peptide  Result Value Ref Range   B Natriuretic Peptide 510.0 (H) 0.0 - 100.0 pg/mL  Procalcitonin - Baseline  Result Value Ref Range   Procalcitonin 17.97 ng/mL  Glucose, capillary  Result Value Ref Range   Glucose-Capillary 238 (H) 70 - 99 mg/dL  Glucose, capillary  Result Value Ref Range   Glucose-Capillary 155 (H) 70 - 99 mg/dL  Glucose, capillary  Result Value Ref Range   Glucose-Capillary 163 (H) 70 - 99 mg/dL  CBG monitoring,  ED  Result Value Ref Range   Glucose-Capillary 219 (H) 70 - 99 mg/dL  Troponin I (High Sensitivity)  Result Value Ref Range   Troponin I (High Sensitivity) 24 (H) <18 ng/L  Troponin I (High Sensitivity)  Result Value Ref Range   Troponin I (High Sensitivity) 24 (H) <18 ng/L   Prolonged service time: 38 mins   Murvin Natal, MD [facility]   How to contact the [facility] Attending or Consulting provider [place] or covering provider during after hours [place], for this patient?  Check the care team in [facility] and look for a) attending/consulting TRH provider listed and b) the [facility] team listed Log into [email or web address] and use [place]'s universal password to access. If you do not have the password, please contact the hospital operator. Locate the [facility] provider you are looking for under [facility] and page to a number that you can be directly reached. If you still have difficulty reaching the provider, please page the [facility] (Director on Call) for the Hospitalists listed on amion for assistance.

## 2022-11-27 NOTE — Progress Notes (Signed)
   11/27/22 0725  Vitals  Temp 98.6 F (37 C)  Temp Source Oral  BP (!) 155/125  MAP (mmHg) 135  BP Location Right Arm  BP Method Automatic  Patient Position (if appropriate) Sitting  Pulse Rate (!) 123  Pulse Rate Source Monitor  Resp (!) 22  MEWS COLOR  MEWS Score Color Yellow  Oxygen Therapy  SpO2 94 %  MEWS Score  MEWS Temp 0  MEWS Systolic 0  MEWS Pulse 2  MEWS RR 1  MEWS LOC 0  MEWS Score 3   Pt experiencing anxiousness, SOB, and coughing/vomiting. Pt shaking, vital signs elevated. PRN Hydroxyzine administered for anxiety and morning medications. MD notified. Will continue to monitor at this time.

## 2022-11-27 NOTE — Assessment & Plan Note (Addendum)
-   Likely related to COVID-19 and possibly some fluid overload - Chest x-ray shows pulmonary edema and pleural effusions of improved.  Mild residual vascular congestion.  Mild cardiomegaly.  Mild bibasilar atelectasis. - Patient has been COVID-positive since January 9 - no isolation needed per Dr. Baxter Flattery -Remains on room air

## 2022-11-27 NOTE — Assessment & Plan Note (Addendum)
-   Treated with hemodialysis and resolved now

## 2022-11-27 NOTE — Assessment & Plan Note (Addendum)
-   A1c 7.4% on 10/18/22 - continue Levemir - Continue SSI and CBG monitoring

## 2022-11-27 NOTE — Progress Notes (Signed)
   HEMODIALYSIS TREATMENT NOTE:  Uneventful 4 hour heparin-free treatment completed using LUE AVF (15g/antegrade). High VPs warranted decreasing Qb.  4.3 liters removed without interruption in UF.  All blood was returned and hemostasis was achieved in 25 minutes.  Pt was transported from [place] back to [facility] where bedside report was given to Lonn Georgia, Therapist, sports.  POST-DIALYSIS:  11/27/22 1845  Vitals  Temp 99.2 F (37.3 C)  Temp Source Oral  BP (!) 165/85  MAP (mmHg) 102  BP Location Right Arm  BP Method Automatic  Patient Position (if appropriate) Lying  Pulse Rate (!) 101  Pulse Rate Source Monitor  ECG Heart Rate 100  Resp 18  Oxygen Therapy  SpO2 96 %  O2 Device Room Air  Post Treatment  Dialyzer Clearance Heavily streaked  Duration of HD Treatment -hour(s) 4 hour(s) (4 hours 38 minutes real time with alarms and cartridge change)  Hemodialysis Intake (mL) 0 mL  Liters Processed 70.2  Fluid Removed (mL) 4300 mL  Tolerated HD Treatment Yes  Post-Hemodialysis Comments Goal met.  AVG/AVF Arterial Site Held (minutes) 10 minutes  AVG/AVF Venous Site Held (minutes) 10 minutes

## 2022-11-27 NOTE — H&P (Signed)
History and Physical    Patient: Kelly Key CBS:[id number] DOB: 08/16/1971 DOA: 11/26/2022 DOS: the patient was seen and examined on 11/27/2022 PCP: [name], MD  Patient coming from: Home  Chief Complaint:  Chief Complaint  Patient presents with   Shortness of Breath   HPI: RACHARD Key is a 52 y.o. male with medical history significant of chronic diastolic CHF, COPD, ESRD, diabetes mellitus type 2, hepatic steatosis, hyperlipidemia, and more presents the ED with a chief complaint of dyspnea.  Patient reports that he has had some level of dyspnea since he was diagnosed with COVID.  Today it was worse.  He was walking across the room carrying a box of books when he suddenly felt short of breath.  He reports that any heavy lifting makes his dyspnea worse.  He reports that normally he would be able to carry heavy items, but he is a poor historian and it is difficult to know if he is talking about his new normal or before he had kidney disease.  Patient reports that he has had chest tightness.  It feels similar to when he has had fluid overload in the past.  Its diffuse across his chest.  It is exertional.  It is not there at this time.  Reports has had a nonproductive cough as well.  He had fevers yesterday, but none today.  Has had chills as well.  Patient complains of rhinorrhea that started yesterday evening.  Patient reports he does not wear oxygen at home.  Does currently make urine.  Patient does complain of muscle cramping which could be related to electrolytes  Patient does not smoke, does not use illicit drugs.  He does not drink.  He is vaccinated for COVID and flu.  Patient is full code. Review of Systems: As mentioned in the history of present illness. All other systems reviewed and are negative. Past Medical History:  Diagnosis Date   Acute diastolic CHF (congestive heart failure) ([place]) 06/11/2012   EF 50-55% [facility])   Chronic kidney disease    COPD (chronic obstructive  pulmonary disease) (HCC)    Diabetes mellitus    A1c 11.5 (06/11/2012).   Dyspnea    Gout    Hepatic steatosis 06/11/2012   Elevated LFTs   Hyperlipemia    Malignant hypertension    Microcytic anemia 06/12/2012   Obesity    Past Surgical History:  Procedure Laterality Date   AV FISTULA PLACEMENT Left 11/11/2019   Procedure: ARTERIOVENOUS (AV) FISTULA CREATION LEFT BRACHIOCEPHALIC ARM;  Surgeon: Serafina Mitchell, MD;  Location: [facility];  Service: Vascular;  Laterality: Left;   COLONOSCOPY WITH PROPOFOL N/A 09/02/2021   Procedure: COLONOSCOPY WITH PROPOFOL;  Surgeon: Jonathon Bellows, MD;  Location: [facility] ENDOSCOPY;  Service: Gastroenterology;  Laterality: N/A;   IR FLUORO GUIDE CV LINE RIGHT  07/08/2019   IR REMOVAL TUN CV CATH W/O FL  03/23/2020   IR US GUIDE VASC ACCESS RIGHT  07/08/2019   LEFT HEART CATH AND CORONARY ANGIOGRAPHY N/A 06/12/2021   Procedure: LEFT HEART CATH AND CORONARY ANGIOGRAPHY;  Surgeon: Belva Crome, MD;  Location: [facility] CV LAB;  Service: Cardiovascular;  Laterality: N/A;   NO PAST SURGERIES     VASCULAR SURGERY     Social History:  reports that he has never smoked. He has been exposed to tobacco smoke. He has never used smokeless tobacco. He reports that he does not currently use alcohol. He reports that he does not use drugs.  Allergies  Allergen Reactions   Ozempic (0.25 Or 0.5 Mg-Dose) [Semaglutide(0.25 Or 0.5mg -Dos)] Shortness Of Breath   Hydralazine Anxiety    Hallucinations    Lisinopril Swelling    Family History  Problem Relation Age of Onset   Gout Mother    Asthma Mother    Diabetes Father    Heart failure Father    Diabetes Sister    Hypertension Brother    Pancreatic cancer Brother    Diabetes Sister     Prior to Admission medications   Medication Sig Start Date End Date Taking? Authorizing Provider  acetaminophen (TYLENOL) 500 MG tablet Take 1,000 mg by mouth every 6 (six) hours as needed for moderate pain or headache.    [provider]  albuterol (VENTOLIN HFA) 108 (90 Base) MCG/ACT inhaler Inhale 2 puffs into the lungs every 4 (four) hours as needed for wheezing or shortness of breath.  03/24/19   [provider]  allopurinol (ZYLOPRIM) 100 MG tablet Take 2 tablets (200 mg total) by mouth daily. 08/04/19   Love, Ivan Anchors, PA-C  AMBULATORY NON FORMULARY MEDICATION Inject 0.2 ml by intracavernosal route as directed.  Medication Name: TriMix PGE 10 mcg Pap   30 mg Phent 1 mg 01/01/22   Stoneking, Reece Leader., MD  amLODipine (NORVASC) 5 MG tablet Take 5 mg by mouth daily. 01/30/22   [provider]  ammonium lactate (AMLACTIN) 12 % cream Apply 1 Application topically in the morning and at bedtime. 09/08/22   [provider]  ascorbic acid (VITAMIN C) 500 MG tablet Take 1 tablet (500 mg total) by mouth daily. 11/06/22 12/06/22  Manuella Ghazi, Pratik D, DO  aspirin EC 81 MG EC tablet Take 1 tablet (81 mg total) by mouth daily. 08/04/19   Love, Ivan Anchors, PA-C  atorvastatin (LIPITOR) 80 MG tablet Take 1 tablet (80 mg total) by mouth daily. 12/05/19   Arnoldo Lenis, MD  Calcium Carbonate Antacid (CALCIUM CARBONATE, DOSED IN MG ELEMENTAL CALCIUM,) 1250 MG/5ML SUSP Take 5 mLs (500 mg of elemental calcium total) by mouth every 6 (six) hours as needed for indigestion. 07/16/19   Amin, Jeanella Flattery, MD  carvedilol (COREG) 25 MG tablet Take 1 tablet (25 mg total) by mouth 2 (two) times daily with a meal. 08/03/19   Love, Ivan Anchors, PA-C  cloNIDine (CATAPRES) 0.3 MG tablet Take 0.3 mg by mouth 3 (three) times daily.    [provider]  esomeprazole (NEXIUM) 20 MG capsule Take 20 mg by mouth daily. 01/23/22   [provider]  ezetimibe (ZETIA) 10 MG tablet Take 10 mg by mouth every evening. 01/20/19   [provider]  fluticasone (FLONASE) 50 MCG/ACT nasal spray Place 1 spray into both nostrils daily as needed for allergies or rhinitis.    [provider]  furosemide (LASIX) 80 MG tablet  Take 1 tablet (80 mg total) by mouth daily. 05/16/21   Shahmehdi, Valeria Batman, MD  gabapentin (NEURONTIN) 100 MG capsule Take 1 capsule (100 mg total) by mouth 3 (three) times daily. 08/03/19   Love, Ivan Anchors, PA-C  glucose blood (ACCU-CHEK AVIVA PLUS) test strip Use as instructed TO CHECK BLOOD GLUCOSE THREE TIMES DAILY 08/15/20   Nida, Marella Chimes, MD  guaiFENesin-dextromethorphan (ROBITUSSIN DM) 100-10 MG/5ML syrup Take 10 mLs by mouth every 8 (eight) hours. 11/05/22   Manuella Ghazi, Pratik D, DO  HUMALOG KWIKPEN 100 UNIT/ML KwikPen Inject 8-14 Units into the skin 3 (three) times daily. 08/19/22   [provider]  hydrOXYzine (ATARAX) 25 MG tablet Take 25 mg by mouth 3 (three) times daily as needed for anxiety. 11/28/21   [provider]  Insulin Pen Needle (B-D ULTRAFINE III SHORT PEN) 31G X 8 MM MISC 1 each by Does not apply route as directed. 08/13/20   Cassandria Anger, MD  Ipratropium-Albuterol (COMBIVENT) 20-100 MCG/ACT AERS respimat Inhale 1 puff into the lungs every 6 (six) hours as needed for wheezing or shortness of breath. 11/05/22 12/05/22  Manuella Ghazi, Pratik D, DO  LEVEMIR FLEXTOUCH 100 UNIT/ML FlexTouch Pen INJECT 50 UNITS SUBCUTANEOUSLY AT BEDTIME Patient taking differently: Inject 50 Units into the skin at bedtime. 07/09/21   Brita Romp, NP  linaclotide Rolan Lipa) 145 MCG CAPS capsule Take 1 capsule (145 mcg total) by mouth daily before breakfast. Patient not taking: Reported on 11/04/2022 04/05/18   Heath Lark D, DO  multivitamin (RENA-VIT) TABS tablet Take 1 tablet by mouth at bedtime. 08/03/19   Love, Ivan Anchors, PA-C  mupirocin ointment (BACTROBAN) 2 % Apply 1 Application topically 2 (two) times daily.    [provider]  OZEMPIC, 1 MG/DOSE, 4 MG/3ML SOPN Inject 1 mg into the skin once a week. Fridays.    [provider]  sevelamer carbonate (RENVELA) 800 MG tablet Take 3 tablets (2,400 mg total) by mouth 3 (three) times daily with meals. 08/03/19   Love,  Ivan Anchors, PA-C  vardenafil (LEVITRA) 20 MG tablet Take 1 tablet (20 mg total) by mouth daily as needed for erectile dysfunction. 08/16/21   Stoneking, Reece Leader., MD  zinc sulfate 220 (50 Zn) MG capsule Take 1 capsule (220 mg total) by mouth daily. 11/06/22 12/06/22  Heath Lark D, DO    Physical Exam: Vitals:   11/26/22 2300 11/26/22 2322 11/26/22 2325 11/27/22 0135  BP: 122/64  (!) 124/108   Pulse: 92  93   Resp:   18   Temp:   98.5 F (36.9 C)   TempSrc:   Oral   SpO2: 96%  97% (!) 87%  Weight:  (!) 140.7 kg    Height:  5\' 6"  (1.676 m)     1.  General: Patient lying supine in bed,  no acute distress   2. Psychiatric: Alert and oriented x 3, mood and behavior normal for situation, pleasant and cooperative with exam   3. Neurologic: Speech and language are normal, face is symmetric, moves all 4 extremities voluntarily, at baseline without acute deficits on limited exam   4. HEENMT:  Head is atraumatic, normocephalic, pupils reactive to light, neck is supple, trachea is midline, mucous membranes are moist   5. Respiratory : Lungs are clear to auscultation bilaterally without wheezing, rhonchi, rales, no cyanosis, no increase in work of breathing or accessory muscle use, observed dry cough, maintaining oxygen sats on 2 L nasal cannula   6. Cardiovascular : Heart rate normal, rhythm is regular, no murmurs, rubs or gallops, no peripheral edema, peripheral pulses palpated   7. Gastrointestinal:  Abdomen is soft, nondistended, nontender to palpation bowel sounds active, no masses or organomegaly palpated   8. Skin:  Skin is warm, dry and intact without rashes, acute lesions, or ulcers on limited exam   9.Musculoskeletal:  No acute deformities or trauma, no asymmetry in tone, no peripheral edema, peripheral pulses palpated, no tenderness to palpation in the extremities  Data Reviewed: In the ED Temperature 98.7, heart rate 88-21, respiratory rate 12-24, blood pressure  108/65-154/84, satting 87-100% Patient was initially brought in on  10 L nasal cannula was able to be reduced to 4 L nasal cannula and then to 2 L nasal cannula They tried to reduce him to room air but he started dropping in the high 80s No leukocytosis, hemoglobin stable at 8.8, platelets 211 Chemistries unremarkable aside from hyperglycemia and elevated BUN and creatinine which is normal for him.  He had a decreased albumin at 2.9, BNP 510, troponin flat at 24, 24 COVID-positive again Chest x-ray shows pulmonary edema, pleural effusion improved.  Mild residual vascular congestion.  EKG shows a heart rate of 98, sinus rhythm, QTc 447 patient was given Lokelma and admission was requested for hypoxia  Assessment and Plan: COVID-19 virus infection - Out of the window for pharmaceutical - Supportive care  GERD (gastroesophageal reflux disease) - Continue Protonix  Hyperkalemia - Potassium 5.6 - Lokelma given in the ED - Defer to nephrology for electrolyte management  Diabetes mellitus type 2 in obese (HCC) - 50 units of insulin at baseline - Reduced dose of basal insulin here - Sliding scale coverage  Acute respiratory failure with hypoxia ([place]) - Likely related to COVID-19 and possibly some fluid overload - Chest x-ray shows pulmonary edema and pleural effusions of improved.  Mild residual vascular congestion.  Mild cardiomegaly.  Mild bibasilar atelectasis. - Patient has been COVID-positive since January 9 - BNP 510 - Troponin 24, 24 - EKG shows a heart rate of 98, sinus rhythm, QTc 4 and 47 - Defer to nephrology for volume control - Monitor intake and output - Albuterol as needed - Continue to monitor  Chronic combined systolic and diastolic CHF (congestive heart failure) (HCC) - Continue aspirin, statin, Coreg, home dose of Lasix - Not clinically in exacerbation - Continue to monitor      Advance Care Planning:   Code Status: Full Code  Consults: None at this  time  Family Communication: No family at bedside  Severity of Illness: The appropriate patient status for this patient is OBSERVATION. Observation status is judged to be reasonable and necessary in order to provide the required intensity of service to ensure the patient's safety. The patient's presenting symptoms, physical exam findings, and initial radiographic and laboratory data in the context of their medical condition is felt to place them at decreased risk for further clinical deterioration. Furthermore, it is anticipated that the patient will be medically stable for discharge from the hospital within 2 midnights of admission.   Author: Rolla Plate, DO 11/27/2022 4:18 AM  For on call review [email or web address].

## 2022-11-28 ENCOUNTER — Inpatient Hospital Stay (HOSPITAL_COMMUNITY): Payer: 59

## 2022-11-28 DIAGNOSIS — R509 Fever, unspecified: Secondary | ICD-10-CM | POA: Diagnosis not present

## 2022-11-28 DIAGNOSIS — I96 Gangrene, not elsewhere classified: Secondary | ICD-10-CM | POA: Diagnosis not present

## 2022-11-28 DIAGNOSIS — J9601 Acute respiratory failure with hypoxia: Secondary | ICD-10-CM | POA: Diagnosis not present

## 2022-11-28 DIAGNOSIS — E1169 Type 2 diabetes mellitus with other specified complication: Secondary | ICD-10-CM | POA: Diagnosis not present

## 2022-11-28 LAB — RENAL FUNCTION PANEL
Albumin: 2.9 g/dL — ABNORMAL LOW (ref 3.5–5.0)
Anion gap: 10 (ref 5–15)
BUN: 52 mg/dL — ABNORMAL HIGH (ref 6–20)
CO2: 26 mmol/L (ref 22–32)
Calcium: 8.1 mg/dL — ABNORMAL LOW (ref 8.9–10.3)
Chloride: 93 mmol/L — ABNORMAL LOW (ref 98–111)
Creatinine, Ser: 8.47 mg/dL — ABNORMAL HIGH (ref 0.61–1.24)
GFR, Estimated: 7 mL/min — ABNORMAL LOW (ref >60)
Glucose, Bld: 130 mg/dL — ABNORMAL HIGH (ref 70–99)
Phosphorus: 3.8 mg/dL (ref 2.5–4.6)
Potassium: 4 mmol/L (ref 3.5–5.1)
Sodium: 129 mmol/L — ABNORMAL LOW (ref 135–145)

## 2022-11-28 LAB — CBC WITH DIFFERENTIAL/PLATELET
Abs Immature Granulocytes: 0.02 10*3/uL (ref 0.00–0.07)
Abs Immature Granulocytes: 0.04 10*3/uL (ref 0.00–0.07)
Basophils Absolute: 0 10*3/uL (ref 0.0–0.1)
Basophils Absolute: 0 10*3/uL (ref 0.0–0.1)
Basophils Relative: 1 %
Basophils Relative: 1 %
Eosinophils Absolute: 0.1 10*3/uL (ref 0.0–0.5)
Eosinophils Absolute: 0.3 10*3/uL (ref 0.0–0.5)
Eosinophils Relative: 2 %
Eosinophils Relative: 4 %
HCT: 27.5 % — ABNORMAL LOW (ref 39.0–52.0)
HCT: 30.9 % — ABNORMAL LOW (ref 39.0–52.0)
Hemoglobin: 8.7 g/dL — ABNORMAL LOW (ref 13.0–17.0)
Hemoglobin: 9.8 g/dL — ABNORMAL LOW (ref 13.0–17.0)
Immature Granulocytes: 0 %
Immature Granulocytes: 1 %
Lymphocytes Relative: 29 %
Lymphocytes Relative: 24 %
Lymphs Abs: 2 10*3/uL (ref 0.7–4.0)
Lymphs Abs: 1.9 10*3/uL (ref 0.7–4.0)
MCH: 28.2 pg (ref 26.0–34.0)
MCH: 28.2 pg (ref 26.0–34.0)
MCHC: 31.6 g/dL (ref 30.0–36.0)
MCHC: 31.7 g/dL (ref 30.0–36.0)
MCV: 89.3 fL (ref 80.0–100.0)
MCV: 88.8 fL (ref 80.0–100.0)
Monocytes Absolute: 1.3 10*3/uL — ABNORMAL HIGH (ref 0.1–1.0)
Monocytes Absolute: 1.3 10*3/uL — ABNORMAL HIGH (ref 0.1–1.0)
Monocytes Relative: 18 %
Monocytes Relative: 17 %
Neutro Abs: 3.7 10*3/uL (ref 1.7–7.7)
Neutro Abs: 4.3 10*3/uL (ref 1.7–7.7)
Neutrophils Relative %: 50 %
Neutrophils Relative %: 53 %
Platelets: 221 10*3/uL (ref 150–400)
Platelets: 233 10*3/uL (ref 150–400)
RBC: 3.08 MIL/uL — ABNORMAL LOW (ref 4.22–5.81)
RBC: 3.48 MIL/uL — ABNORMAL LOW (ref 4.22–5.81)
RDW: 15 % (ref 11.5–15.5)
RDW: 14.8 % (ref 11.5–15.5)
WBC: 7.1 10*3/uL (ref 4.0–10.5)
WBC: 7.9 10*3/uL (ref 4.0–10.5)
nRBC: 0 % (ref 0.0–0.2)
nRBC: 0 % (ref 0.0–0.2)

## 2022-11-28 LAB — GLUCOSE, CAPILLARY
Glucose-Capillary: 110 mg/dL — ABNORMAL HIGH (ref 70–99)
Glucose-Capillary: 147 mg/dL — ABNORMAL HIGH (ref 70–99)
Glucose-Capillary: 239 mg/dL — ABNORMAL HIGH (ref 70–99)
Glucose-Capillary: 159 mg/dL — ABNORMAL HIGH (ref 70–99)
Glucose-Capillary: 116 mg/dL — ABNORMAL HIGH (ref 70–99)

## 2022-11-28 LAB — SEDIMENTATION RATE: Sed Rate: 118 mm/hr — ABNORMAL HIGH (ref 0–16)

## 2022-11-28 LAB — C-REACTIVE PROTEIN: CRP: 17 mg/dL — ABNORMAL HIGH (ref <1.0)

## 2022-11-28 LAB — PROCALCITONIN: Procalcitonin: >150 ng/mL

## 2022-11-28 LAB — HEPATITIS B SURFACE ANTIBODY, QUANTITATIVE: Hep B S AB Quant (Post): 19.9 m[IU]/mL (ref >9.9)

## 2022-11-28 LAB — PREALBUMIN: Prealbumin: 22 mg/dL (ref 18–38)

## 2022-11-28 MED ORDER — VANCOMYCIN HCL IN DEXTROSE 1-5 GM/200ML-% IV SOLN
1000.0000 mg | INTRAVENOUS | Status: DC
  Filled 2022-11-28: qty 200

## 2022-11-28 MED ORDER — PROSOURCE PLUS PO LIQD
30.0000 mL | Freq: Three times a day (TID) | ORAL | Status: DC
  Administered 2022-11-28 – 2022-12-03 (×10): 30 mL via ORAL
  Filled 2022-11-28 (×10): qty 30

## 2022-11-28 MED ORDER — CHLORHEXIDINE GLUCONATE CLOTH 2 % EX PADS
6.0000 | MEDICATED_PAD | Freq: Every day | CUTANEOUS | Status: DC
  Administered 2022-11-30 – 2022-12-02 (×3): 6 via TOPICAL

## 2022-11-28 MED ORDER — SODIUM CHLORIDE 0.9 % IV SOLN
2.0000 g | INTRAVENOUS | Status: DC
  Administered 2022-11-28: 2 g via INTRAVENOUS
  Filled 2022-11-28: qty 20

## 2022-11-28 MED ORDER — VANCOMYCIN HCL 2000 MG/400ML IV SOLN
2000.0000 mg | Freq: Once | INTRAVENOUS | Status: AC
  Administered 2022-11-28: 2000 mg via INTRAVENOUS
  Filled 2022-11-28: qty 400

## 2022-11-28 MED ORDER — JUVEN PO PACK
1.0000 | PACK | Freq: Two times a day (BID) | ORAL | Status: DC
  Administered 2022-11-28 – 2022-12-03 (×9): 1 via ORAL
  Filled 2022-11-28 (×9): qty 1

## 2022-11-28 MED ORDER — SODIUM CHLORIDE 0.9 % IV SOLN: 2.0000 g | INTRAVENOUS | Status: DC

## 2022-11-28 MED ORDER — METRONIDAZOLE 500 MG PO TABS
500.0000 mg | ORAL_TABLET | Freq: Two times a day (BID) | ORAL | Status: DC
  Administered 2022-11-28 – 2022-12-03 (×11): 500 mg via ORAL
  Filled 2022-11-28 (×11): qty 1

## 2022-11-28 MED ORDER — CALCITRIOL 0.5 MCG PO CAPS
2.5000 ug | ORAL_CAPSULE | ORAL | Status: DC
  Administered 2022-11-29 – 2022-12-06 (×4): 2.5 ug via ORAL
  Filled 2022-11-28 (×4): qty 5

## 2022-11-28 MED ORDER — SODIUM CHLORIDE 0.9 % IV SOLN
2.0000 g | Freq: Once | INTRAVENOUS | Status: AC
  Administered 2022-11-28: 2 g via INTRAVENOUS
  Filled 2022-11-28: qty 2

## 2022-11-28 NOTE — Assessment & Plan Note (Signed)
>>  ASSESSMENT AND PLAN FOR ESRD (END STAGE RENAL DISEASE) (HCC) WRITTEN ON 11/29/2022  2:59 PM BY PATSY LENIS, MD  - Nephrology arranging hemodialysis - Pt is on schedule with [facility] HD treatments

## 2022-11-28 NOTE — Assessment & Plan Note (Addendum)
-   Nephrology arranging hemodialysis - Pt is on schedule with [facility] HD treatments

## 2022-11-28 NOTE — Consult Note (Signed)
[facility]  Total days of antibiotics 2/vanco/ceftriraxone/metronidazole               Reason for Consult: wet gangrene/osteomyelitis of left foot/2nd toe   Referring Physician: Wynetta Emery  Principal Problem:   Acute respiratory failure with hypoxia ([place]) Active Problems:   Chronic combined systolic and diastolic CHF (congestive heart failure) ([place])   ESRD (end stage renal disease) ([place])   COVID-19 virus infection   Diabetes mellitus type 2 in obese (HCC)   Hyperkalemia   GERD (gastroesophageal reflux disease)   Fever, unspecified   Wet gangrene left 2nd toe    HPI: Kelly Key is a 52 y.o. male with ESRD, COPD, DM, hx of osteomyelitis of foot,recent covid infection (11/04/22) who had worsening shortness of breath and fevers on 1/31. He had been attending hemodialysis as scheduled, however, he was mentioning that he has worsening left foot wound, becoming black.(As noted in media photos) He continues to have low grade fevers, WBC 7. He underwent imaging including ABIs and MRI of foot does suggest osteomyelitis and soft tissue infection to 2nd toe. He was started on empiric antibiotics.   IMPRESSION: 1. Examination is limited by motion, especially on the sagittal images. Plain film correlation and correlation with prior surgical history recommended. 2. The distal 2nd phalanx appears abnormal with probable cortical destruction and marrow edema, suspicious for osteomyelitis or osteonecrosis. 3. The proximal and middle phalanges of the 2nd toe demonstrate nonspecific low-level marrow T2 hyperintensity without T1 signal abnormality which could be reactive. 4. Diffuse soft tissue swelling throughout the 2nd toe consistent with soft tissue infection. Cannot exclude a small amount of soft tissue emphysema distally which could indicate necrotizing infection (gangrene). No focal fluid collection identified. Mild flexor tenosynovitis of the 2nd toe. 5. Presumed  interval resection of the distal 3rd and 4th phalanges.   Past Medical History:  Diagnosis Date   Acute diastolic CHF (congestive heart failure) ([place]) 06/11/2012   EF 50-55% [facility])   Chronic kidney disease    COPD (chronic obstructive pulmonary disease) (HCC)    Diabetes mellitus    A1c 11.5 (06/11/2012).   Dyspnea    Gout    Hepatic steatosis 06/11/2012   Elevated LFTs   Hyperlipemia    Malignant hypertension    Microcytic anemia 06/12/2012   Obesity     Allergies:  Allergies  Allergen Reactions   Ozempic (0.25 Or 0.5 Mg-Dose) [Semaglutide(0.25 Or 0.5mg -Dos)] Shortness Of Breath   Hydralazine Anxiety    Hallucinations    Lisinopril Swelling    MEDICATIONS:  (feeding supplement) PROSource Plus  30 mL Oral TID WC   amLODipine  5 mg Oral Daily   ascorbic acid  500 mg Oral Daily   aspirin EC  81 mg Oral Daily   atorvastatin  80 mg Oral Daily   [START ON 11/29/2022] calcitRIOL  2.5 mcg Oral Q T,Th,Sa-HD   carvedilol  25 mg Oral BID WC   Chlorhexidine Gluconate Cloth  6 each Topical Q0600   [START ON 11/29/2022] Chlorhexidine Gluconate Cloth  6 each Topical Q0600   cloNIDine  0.3 mg Oral TID   ezetimibe  10 mg Oral QPM   furosemide  80 mg Oral Daily   gabapentin  100 mg Oral TID   guaiFENesin-dextromethorphan  10 mL Oral Q8H   heparin  5,000 Units Subcutaneous Q8H   insulin aspart  0-15 Units Subcutaneous TID WC   insulin aspart  0-5 Units Subcutaneous QHS  insulin detemir  40 Units Subcutaneous QHS   Ipratropium-Albuterol  1 puff Inhalation BID   metroNIDAZOLE  500 mg Oral Q12H   multivitamin  1 tablet Oral QHS   nutrition supplement (JUVEN)  1 packet Oral BID BM   pantoprazole  40 mg Oral Daily   sevelamer carbonate  2,400 mg Oral TID WC   zinc sulfate  220 mg Oral Daily    Social History   Tobacco Use   Smoking status: Never    Passive exposure: Past   Smokeless tobacco: Never  Vaping Use   Vaping Use: Never used  Substance Use Topics   Alcohol  use: Not Currently    Comment: haven't drank in over 5 years    Drug use: No    Family History  Problem Relation Age of Onset   Gout Mother    Asthma Mother    Diabetes Father    Heart failure Father    Diabetes Sister    Hypertension Brother    Pancreatic cancer Brother    Diabetes Sister      Did not examine  OBJECTIVE: Temp:  [98.1 F (36.7 C)-100.5 F (38.1 C)] 99.4 F (37.4 C) (02/02 0618) Pulse Rate:  [92-101] 92 (02/02 0503) Resp:  [16-20] 20 (02/02 0503) BP: (137-203)/(76-103) 155/80 (02/02 0503) SpO2:  [91 %-96 %] 94 % (02/02 0821) Weight:  [136.1 kg] 136.1 kg (02/01 1845) Did not examine  LABS: Results for orders placed or performed during the hospital encounter of 11/26/22 (from the past 48 hour(s))  CBG monitoring, ED     Status: Abnormal   Collection Time: 11/26/22  6:10 PM  Result Value Ref Range   Glucose-Capillary 219 (H) 70 - 99 mg/dL    Comment: Glucose reference range applies only to samples taken after fasting for at least 8 hours.  CBC with Differential     Status: Abnormal   Collection Time: 11/26/22  6:51 PM  Result Value Ref Range   WBC 7.3 4.0 - 10.5 K/uL   RBC 3.07 (L) 4.22 - 5.81 MIL/uL   Hemoglobin 8.8 (L) 13.0 - 17.0 g/dL   HCT 27.6 (L) 39.0 - 52.0 %   MCV 89.9 80.0 - 100.0 fL   MCH 28.7 26.0 - 34.0 pg   MCHC 31.9 30.0 - 36.0 g/dL   RDW 15.1 11.5 - 15.5 %   Platelets 211 150 - 400 K/uL   nRBC 0.0 0.0 - 0.2 %   Neutrophils Relative % 87 %   Neutro Abs 6.3 1.7 - 7.7 K/uL   Lymphocytes Relative 9 %   Lymphs Abs 0.6 (L) 0.7 - 4.0 K/uL   Monocytes Relative 2 %   Monocytes Absolute 0.2 0.1 - 1.0 K/uL   Eosinophils Relative 2 %   Eosinophils Absolute 0.1 0.0 - 0.5 K/uL   Basophils Relative 0 %   Basophils Absolute 0.0 0.0 - 0.1 K/uL   Immature Granulocytes 0 %   Abs Immature Granulocytes 0.02 0.00 - 0.07 K/uL    Comment: Performed at [facility], [address], [place], [place] [address]  Comprehensive metabolic panel     Status:  Abnormal   Collection Time: 11/26/22  6:51 PM  Result Value Ref Range   Sodium 131 (L) 135 - 145 mmol/L   Potassium 5.6 (H) 3.5 - 5.1 mmol/L   Chloride 92 (L) 98 - 111 mmol/L   CO2 26 22 - 32 mmol/L   Glucose, Bld 251 (H) 70 - 99 mg/dL  Comment: Glucose reference range applies only to samples taken after fasting for at least 8 hours.   BUN 66 (H) 6 - 20 mg/dL   Creatinine, Ser 9.55 (H) 0.61 - 1.24 mg/dL   Calcium 8.2 (L) 8.9 - 10.3 mg/dL   Total Protein 7.6 6.5 - 8.1 g/dL   Albumin 2.9 (L) 3.5 - 5.0 g/dL   AST 19 15 - 41 U/L   ALT 17 0 - 44 U/L   Alkaline Phosphatase 73 38 - 126 U/L   Total Bilirubin 0.6 0.3 - 1.2 mg/dL   GFR, Estimated 6 (L) >60 mL/min    Comment: (NOTE) Calculated using the CKD-EPI Creatinine Equation (2021)    Anion gap 13 5 - 15    Comment: Performed at [facility], [address], [place], [place] [address]  Brain natriuretic peptide     Status: Abnormal   Collection Time: 11/26/22  6:51 PM  Result Value Ref Range   B Natriuretic Peptide 510.0 (H) 0.0 - 100.0 pg/mL    Comment: Performed at [facility], [address], [place], [place] [address]  Troponin I (High Sensitivity)     Status: Abnormal   Collection Time: 11/26/22  6:51 PM  Result Value Ref Range   Troponin I (High Sensitivity) 24 (H) <18 ng/L    Comment: (NOTE) Elevated high sensitivity troponin I (hsTnI) values and significant  changes across serial measurements may suggest ACS but many other  chronic and acute conditions are known to elevate hsTnI results.  Refer to the "Links" section for chest pain algorithms and additional  guidance. Performed at [facility], [address] [place], [place] [address]   Resp panel by RT-PCR (RSV, Flu A&B, Covid) Anterior Nasal Swab     Status: Abnormal   Collection Time: 11/26/22  7:58 PM   Specimen: Anterior Nasal Swab  Result Value Ref Range   SARS Coronavirus 2 by RT PCR POSITIVE (A) NEGATIVE    Comment: (NOTE) SARS-CoV-2 target nucleic acids  are DETECTED.  The SARS-CoV-2 RNA is generally detectable in upper respiratory specimens during the acute phase of infection. Positive results are indicative of the presence of the identified virus, but do not rule out bacterial infection or co-infection with other pathogens not detected by the test. Clinical correlation with patient history and other diagnostic information is necessary to determine patient infection status. The expected result is Negative.  Fact Sheet for Patients: EntrepreneurPulse.com.au  Fact Sheet for Healthcare Providers: IncredibleEmployment.be  This test is not yet approved or cleared by the [place] FDA and  has been authorized for detection and/or diagnosis of SARS-CoV-2 by FDA under an Emergency Use Authorization (EUA).  This EUA will remain in effect (meaning this test can be used) for the duration of  the COVID-19 declaration under Section 564(b)(1) of the A ct, 21 U.S.C. section 360bbb-3(b)(1), unless the authorization is terminated or revoked sooner.     Influenza A by PCR NEGATIVE NEGATIVE   Influenza B by PCR NEGATIVE NEGATIVE    Comment: (NOTE) The Xpert Xpress SARS-CoV-2/FLU/RSV plus assay is intended as an aid in the diagnosis of influenza from Nasopharyngeal swab specimens and should not be used as a sole basis for treatment. Nasal washings and aspirates are unacceptable for Xpert Xpress SARS-CoV-2/FLU/RSV testing.  Fact Sheet for Patients: EntrepreneurPulse.com.au  Fact Sheet for Healthcare Providers: IncredibleEmployment.be  This test is not yet approved or cleared by the [place] FDA and has been authorized for detection and/or diagnosis of SARS-CoV-2 by  FDA under an Emergency Use Authorization (EUA). This EUA will remain in effect (meaning this test can be used) for the duration of the COVID-19 declaration under Section 564(b)(1) of the Act, 21  U.S.C. section 360bbb-3(b)(1), unless the authorization is terminated or revoked.     Resp Syncytial Virus by PCR NEGATIVE NEGATIVE    Comment: (NOTE) Fact Sheet for Patients: EntrepreneurPulse.com.au  Fact Sheet for Healthcare Providers: IncredibleEmployment.be  This test is not yet approved or cleared by the [place] FDA and has been authorized for detection and/or diagnosis of SARS-CoV-2 by FDA under an Emergency Use Authorization (EUA). This EUA will remain in effect (meaning this test can be used) for the duration of the COVID-19 declaration under Section 564(b)(1) of the Act, 21 U.S.C. section 360bbb-3(b)(1), unless the authorization is terminated or revoked.  Performed at [facility], [address] [place], [place] [address]   Troponin I (High Sensitivity)     Status: Abnormal   Collection Time: 11/26/22  8:38 PM  Result Value Ref Range   Troponin I (High Sensitivity) 24 (H) <18 ng/L    Comment: (NOTE) Elevated high sensitivity troponin I (hsTnI) values and significant  changes across serial measurements may suggest ACS but many other  chronic and acute conditions are known to elevate hsTnI results.  Refer to the "Links" section for chest pain algorithms and additional  guidance. Performed at [facility], [address], [place], [place] [address]   Procalcitonin - Baseline     Status: None   Collection Time: 11/26/22  8:38 PM  Result Value Ref Range   Procalcitonin 17.97 ng/mL    Comment:        Interpretation: PCT >= 10 ng/mL: Important systemic inflammatory response, almost exclusively due to severe bacterial sepsis or septic shock. (NOTE)       Sepsis PCT Algorithm           Lower Respiratory Tract                                      Infection PCT Algorithm    ----------------------------     ----------------------------         PCT < 0.25 ng/mL                PCT < 0.10 ng/mL          Strongly encourage              Strongly discourage   discontinuation of antibiotics    initiation of antibiotics    ----------------------------     -----------------------------       PCT 0.25 - 0.50 ng/mL            PCT 0.10 - 0.25 ng/mL               OR       >80% decrease in PCT            Discourage initiation of                                            antibiotics      Encourage discontinuation           of antibiotics    ----------------------------     -----------------------------  PCT >= 0.50 ng/mL              PCT 0.26 - 0.50 ng/mL                AND       <80% decrease in PCT             Encourage initiation of                                             antibiotics       Encourage continuation           of antibiotics    ----------------------------     -----------------------------        PCT >= 0.50 ng/mL                  PCT > 0.50 ng/mL               AND         increase in PCT                  Strongly encourage                                      initiation of antibiotics    Strongly encourage escalation           of antibiotics                                     -----------------------------                                           PCT <= 0.25 ng/mL                                                 OR                                        > 80% decrease in PCT                                      Discontinue / Do not initiate                                             antibiotics  Performed at [facility], [address], [place], [place] [address]   Glucose, capillary     Status: Abnormal   Collection Time: 11/26/22 11:30 PM  Result Value Ref Range   Glucose-Capillary 238 (H) 70 - 99 mg/dL    Comment: Glucose reference range applies only to samples taken after fasting for at least 8 hours.  Glucose, capillary  Status: Abnormal   Collection Time: 11/27/22  7:31 AM  Result Value Ref Range   Glucose-Capillary 155 (H) 70 - 99 mg/dL    Comment: Glucose reference range  applies only to samples taken after fasting for at least 8 hours.  Hepatitis B surface antigen     Status: None   Collection Time: 11/27/22  9:38 AM  Result Value Ref Range   Hepatitis B Surface Ag NON REACTIVE NON REACTIVE    Comment: Performed at [facility] [address], [place], [place] [address]  Hepatitis B surface antibody,quantitative     Status: None   Collection Time: 11/27/22  9:38 AM  Result Value Ref Range   Hep B S AB Quant (Post) 19.9 Immunity>9.9 mIU/mL    Comment: (NOTE)  Status of Immunity                     Anti-HBs Level  ------------------                     -------------- Inconsistent with Immunity                   0.0 - 9.9 Consistent with Immunity                          >9.9 Performed At: [facility] [place], [place] [id number] Rush Farmer MD VE:[id number]   Glucose, capillary     Status: Abnormal   Collection Time: 11/27/22 11:32 AM  Result Value Ref Range   Glucose-Capillary 163 (H) 70 - 99 mg/dL    Comment: Glucose reference range applies only to samples taken after fasting for at least 8 hours.  CBC with Differential/Platelet     Status: Abnormal   Collection Time: 11/27/22 12:00 PM  Result Value Ref Range   WBC 7.9 4.0 - 10.5 K/uL   RBC 2.87 (L) 4.22 - 5.81 MIL/uL   Hemoglobin 8.1 (L) 13.0 - 17.0 g/dL   HCT 25.5 (L) 39.0 - 52.0 %   MCV 88.9 80.0 - 100.0 fL   MCH 28.2 26.0 - 34.0 pg   MCHC 31.8 30.0 - 36.0 g/dL   RDW 14.9 11.5 - 15.5 %   Platelets 212 150 - 400 K/uL   nRBC 0.0 0.0 - 0.2 %   Neutrophils Relative % 70 %   Neutro Abs 5.6 1.7 - 7.7 K/uL   Lymphocytes Relative 14 %   Lymphs Abs 1.1 0.7 - 4.0 K/uL   Monocytes Relative 13 %   Monocytes Absolute 1.0 0.1 - 1.0 K/uL   Eosinophils Relative 2 %   Eosinophils Absolute 0.1 0.0 - 0.5 K/uL   Basophils Relative 1 %   Basophils Absolute 0.0 0.0 - 0.1 K/uL   Immature Granulocytes 0 %   Abs Immature Granulocytes 0.03 0.00 - 0.07 K/uL    Comment:  Performed at [facility], [address] [place], [place] [address]  Comprehensive metabolic panel     Status: Abnormal   Collection Time: 11/27/22 12:00 PM  Result Value Ref Range   Sodium 132 (L) 135 - 145 mmol/L   Potassium 4.2 3.5 - 5.1 mmol/L    Comment: DELTA CHECK NOTED   Chloride 92 (L) 98 - 111 mmol/L   CO2 24 22 - 32 mmol/L   Glucose, Bld 150 (H) 70 - 99 mg/dL    Comment: Glucose reference range applies only to samples taken  after fasting for at least 8 hours.   BUN 75 (H) 6 - 20 mg/dL   Creatinine, Ser 10.77 (H) 0.61 - 1.24 mg/dL   Calcium 8.1 (L) 8.9 - 10.3 mg/dL   Total Protein 7.3 6.5 - 8.1 g/dL   Albumin 2.9 (L) 3.5 - 5.0 g/dL   AST 24 15 - 41 U/L   ALT 21 0 - 44 U/L   Alkaline Phosphatase 60 38 - 126 U/L   Total Bilirubin 0.7 0.3 - 1.2 mg/dL   GFR, Estimated 5 (L) >60 mL/min    Comment: (NOTE) Calculated using the CKD-EPI Creatinine Equation (2021)    Anion gap 16 (H) 5 - 15    Comment: Performed at [facility], [address] [place], [place] [address]  Magnesium     Status: None   Collection Time: 11/27/22 12:00 PM  Result Value Ref Range   Magnesium 2.3 1.7 - 2.4 mg/dL    Comment: Performed at [facility], [address], [place], [place] [address]  Culture, blood (Routine X 2) w Reflex to ID Panel     Status: None (Preliminary result)   Collection Time: 11/27/22  1:55 PM   Specimen: BLOOD  Result Value Ref Range   Specimen Description BLOOD aline    Special Requests      BOTTLES DRAWN AEROBIC AND ANAEROBIC Blood Culture adequate volume   Culture      NO GROWTH < 24 HOURS Performed at [facility], [address], [place], [place] [address]    Report Status PENDING   Culture, blood (Routine X 2) w Reflex to ID Panel     Status: None (Preliminary result)   Collection Time: 11/27/22  1:58 PM   Specimen: BLOOD  Result Value Ref Range   Specimen Description BLOOD vline    Special Requests      BOTTLES DRAWN AEROBIC AND ANAEROBIC Blood Culture adequate  volume   Culture      NO GROWTH < 24 HOURS Performed at [facility], [address], [place], [place] [address]    Report Status PENDING   Glucose, capillary     Status: Abnormal   Collection Time: 11/27/22  4:29 PM  Result Value Ref Range   Glucose-Capillary 149 (H) 70 - 99 mg/dL    Comment: Glucose reference range applies only to samples taken after fasting for at least 8 hours.  Glucose, capillary     Status: Abnormal   Collection Time: 11/27/22 10:51 PM  Result Value Ref Range   Glucose-Capillary 232 (H) 70 - 99 mg/dL    Comment: Glucose reference range applies only to samples taken after fasting for at least 8 hours.  Renal function panel     Status: Abnormal   Collection Time: 11/28/22  4:48 AM  Result Value Ref Range   Sodium 129 (L) 135 - 145 mmol/L   Potassium 4.0 3.5 - 5.1 mmol/L   Chloride 93 (L) 98 - 111 mmol/L   CO2 26 22 - 32 mmol/L   Glucose, Bld 130 (H) 70 - 99 mg/dL    Comment: Glucose reference range applies only to samples taken after fasting for at least 8 hours.   BUN 52 (H) 6 - 20 mg/dL   Creatinine, Ser 8.47 (H) 0.61 - 1.24 mg/dL   Calcium 8.1 (L) 8.9 - 10.3 mg/dL   Phosphorus 3.8 2.5 - 4.6 mg/dL   Albumin 2.9 (L) 3.5 - 5.0 g/dL   GFR, Estimated 7 (L) >60 mL/min  Comment: (NOTE) Calculated using the CKD-EPI Creatinine Equation (2021)    Anion gap 10 5 - 15    Comment: Performed at [facility], [address] [place], [place] [address]  Procalcitonin     Status: None   Collection Time: 11/28/22  4:48 AM  Result Value Ref Range   Procalcitonin >150.00 ng/mL    Comment:        Interpretation: PCT >= 10 ng/mL: Important systemic inflammatory response, almost exclusively due to severe bacterial sepsis or septic shock. (NOTE)       Sepsis PCT Algorithm           Lower Respiratory Tract                                      Infection PCT Algorithm    ----------------------------     ----------------------------         PCT < 0.25 ng/mL                 PCT < 0.10 ng/mL          Strongly encourage             Strongly discourage   discontinuation of antibiotics    initiation of antibiotics    ----------------------------     -----------------------------       PCT 0.25 - 0.50 ng/mL            PCT 0.10 - 0.25 ng/mL               OR       >80% decrease in PCT            Discourage initiation of                                            antibiotics      Encourage discontinuation           of antibiotics    ----------------------------     -----------------------------         PCT >= 0.50 ng/mL              PCT 0.26 - 0.50 ng/mL                AND       <80% decrease in PCT             Encourage initiation of                                             antibiotics       Encourage continuation           of antibiotics    ----------------------------     -----------------------------        PCT >= 0.50 ng/mL                  PCT > 0.50 ng/mL               AND         increase in PCT                  Strongly encourage  initiation of antibiotics    Strongly encourage escalation           of antibiotics                                     -----------------------------                                           PCT <= 0.25 ng/mL                                                 OR                                        > 80% decrease in PCT                                      Discontinue / Do not initiate                                             antibiotics  Performed at [facility], [address], [place], [place] [address]   Glucose, capillary     Status: Abnormal   Collection Time: 11/28/22  7:51 AM  Result Value Ref Range   Glucose-Capillary 110 (H) 70 - 99 mg/dL    Comment: Glucose reference range applies only to samples taken after fasting for at least 8 hours.  CBC with Differential/Platelet     Status: Abnormal   Collection Time: 11/28/22  8:31 AM  Result Value Ref Range   WBC 7.1 4.0  - 10.5 K/uL   RBC 3.08 (L) 4.22 - 5.81 MIL/uL   Hemoglobin 8.7 (L) 13.0 - 17.0 g/dL   HCT 27.5 (L) 39.0 - 52.0 %   MCV 89.3 80.0 - 100.0 fL   MCH 28.2 26.0 - 34.0 pg   MCHC 31.6 30.0 - 36.0 g/dL   RDW 15.0 11.5 - 15.5 %   Platelets 221 150 - 400 K/uL   nRBC 0.0 0.0 - 0.2 %   Neutrophils Relative % 50 %   Neutro Abs 3.7 1.7 - 7.7 K/uL   Lymphocytes Relative 29 %   Lymphs Abs 2.0 0.7 - 4.0 K/uL   Monocytes Relative 18 %   Monocytes Absolute 1.3 (H) 0.1 - 1.0 K/uL   Eosinophils Relative 2 %   Eosinophils Absolute 0.1 0.0 - 0.5 K/uL   Basophils Relative 1 %   Basophils Absolute 0.0 0.0 - 0.1 K/uL   Immature Granulocytes 0 %   Abs Immature Granulocytes 0.02 0.00 - 0.07 K/uL    Comment: Performed at [facility], [address] [place], [place] [address]  Sedimentation rate     Status: Abnormal   Collection Time: 11/28/22  9:25 AM  Result Value Ref Range   Sed Rate 118 (H) 0 - 16 mm/hr    Comment: Performed at  [facility] [place], [place] [address]  C-reactive protein     Status: Abnormal   Collection Time: 11/28/22  9:25 AM  Result Value Ref Range   CRP 17.0 (H) <1.0 mg/dL    Comment: Performed at [facility] [address], [place], [place] [address]  Prealbumin     Status: None   Collection Time: 11/28/22  9:25 AM  Result Value Ref Range   Prealbumin 22 18 - 38 mg/dL    Comment: Performed at [facility] [address], [place], [place] [address]  Glucose, capillary     Status: Abnormal   Collection Time: 11/28/22  1:00 PM  Result Value Ref Range   Glucose-Capillary 147 (H) 70 - 99 mg/dL    Comment: Glucose reference range applies only to samples taken after fasting for at least 8 hours.    MICRO: Blood cx NGTD MRSA colonized IMAGING: MR FOOT LEFT WO CONTRAST  Result Date: 11/28/2022 CLINICAL DATA:  Foot swelling, diabetic, osteomyelitis suspected. Concern for wet gangrene of the left 2nd toe. EXAM: MRI OF THE LEFT FOOT WITHOUT CONTRAST  TECHNIQUE: Multiplanar, multisequence MR imaging of the left forefoot was performed. No intravenous contrast was administered. COMPARISON:  Previous MRI 11/06/2021. No comparison radiographs available. FINDINGS: Despite efforts by the technologist and patient, mild-to-moderate motion artifact is present on today's exam and could not be eliminated. This reduces exam sensitivity and specificity. Motion is greatest on the sagittal images which are essentially nondiagnostic. Bones/Joint/Cartilage Evaluation of the distal phalanges is limited by clawtoe deformities and the lack of diagnostic sagittal images. The distal 2nd phalanx appears abnormal with diffusely decreased T1 and increased T2 signal. In addition, there is probable cortical destruction. The proximal and middle phalanges demonstrate nonspecific low-level marrow T2 hyperintensity without T1 signal abnormality. Presumed interval resection of the distal 3rd and 4th phalanges. No acute findings are seen within the great or small toes. There are degenerative changes at the 1st interphalangeal and metatarsophalangeal joints. No significant joint effusions. Ligaments The collateral ligaments at the metatarsophalangeal joints are intact. The Lisfranc ligament is intact. Muscles and Tendons Chronic generalized fatty atrophy throughout the forefoot musculature, similar to previous study. Mild tenosynovitis of the flexor tendon of the 2nd toe. The extensor tendons appear unremarkable. Soft tissues Nonspecific dorsal subcutaneous edema throughout the forefoot. There is generalized soft tissue swelling in the 2nd toe which appears greatest distally. Cannot exclude a small amount of soft tissue emphysema within the distal aspect of the 2nd toe. No focal fluid collection identified. IMPRESSION: 1. Examination is limited by motion, especially on the sagittal images. Plain film correlation and correlation with prior surgical history recommended. 2. The distal 2nd phalanx  appears abnormal with probable cortical destruction and marrow edema, suspicious for osteomyelitis or osteonecrosis. 3. The proximal and middle phalanges of the 2nd toe demonstrate nonspecific low-level marrow T2 hyperintensity without T1 signal abnormality which could be reactive. 4. Diffuse soft tissue swelling throughout the 2nd toe consistent with soft tissue infection. Cannot exclude a small amount of soft tissue emphysema distally which could indicate necrotizing infection (gangrene). No focal fluid collection identified. Mild flexor tenosynovitis of the 2nd toe. 5. Presumed interval resection of the distal 3rd and 4th phalanges. Electronically Signed   By: Richardean Sale M.D.   On: 11/28/2022 13:02   US ARTERIAL ABI (SCREENING LOWER EXTREMITY)  Result Date: 11/28/2022 CLINICAL DATA:  Toe gangrene EXAM: NONINVASIVE PHYSIOLOGIC VASCULAR STUDY OF BILATERAL LOWER EXTREMITIES TECHNIQUE: Evaluation  of both lower extremities were performed at rest, including calculation of ankle-brachial indices with single level pressure measurements and doppler recording. COMPARISON:  None available. FINDINGS: Right ABI:  1.12 Left ABI:  0.89 Right Lower Extremity: Multiphasic waveform in posterior tibial artery. Dorsalis pedis is monophasic. Left Lower Extremity: Posterior tibial and dorsalis pedis are monophasic. 0.8-0.89 Mild PAD IMPRESSION: Mild left lower extremity PAD. Electronically Signed   By: Miachel Roux M.D.   On: 11/28/2022 10:08   DG CHEST PORT 1 VIEW  Result Date: 11/28/2022 CLINICAL DATA:  Dyspnea and fever, COVID infection EXAM: PORTABLE CHEST 1 VIEW COMPARISON:  11/26/2022 FINDINGS: Upper normal heart size for technique. Previous cephalization of blood flow in indistinct pulmonary vasculature has improved, currently with no compelling findings of pulmonary venous hypertension or edema. Atherosclerotic calcification of the aortic arch. No blunting of the costophrenic angles. Mild thoracic spondylosis.  IMPRESSION: 1. Upper normal heart size for technique. No compelling findings of pulmonary venous hypertension or edema. 2. Mild thoracic spondylosis. Electronically Signed   By: Van Clines M.D.   On: 11/28/2022 07:36   DG Chest Port 1 View  Result Date: 11/26/2022 CLINICAL DATA:  Shortness of breath. EXAM: PORTABLE CHEST 1 VIEW COMPARISON:  11/04/2022 FINDINGS: Improved pulmonary edema from prior exam. There is mild vascular congestion. Mild cardiomegaly appears similar. Decreased bilateral pleural effusions. Mild bibasilar atelectasis without confluent airspace disease. No pneumothorax. IMPRESSION: 1. Improved pulmonary edema and pleural effusions from prior exam. Mild residual vascular congestion. Mild cardiomegaly is similar. 2. Mild bibasilar atelectasis. Electronically Signed   By: Keith Rake M.D.   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  - he is recovered from covid-19 illness earlier in January, does not need airborne/contact isolation  Shortness of breath on admit = likely from pulmonary edema ,now improved on latest xray  Diabetic foot osteomyelitis/gangrenous 2nd digit of left foot = recommend for surgical amputation for source control. Can continue on broad spectrum for the time being. When he goes to surgery, recommend proximal tissue cultures sent for aerobic/anaerobic cx and path to see if need to continue further treatment with hd.  Recommend to change to vancomycin/ceftaz plus oral metronidazole.

## 2022-11-28 NOTE — Progress Notes (Signed)
   11/28/22 0503  Vitals  Temp (!) 100.5 F (38.1 C)  Temp Source Oral  BP (!) 155/80  MAP (mmHg) 99  BP Location Right Arm  BP Method Automatic  Patient Position (if appropriate) Lying  Pulse Rate 92  Pulse Rate Source Dinamap  Resp 20  MEWS COLOR  MEWS Score Color Green  Oxygen Therapy  SpO2 93 %  O2 Device Nasal Cannula  O2 Flow Rate (L/min) 2 L/min  MEWS Score  MEWS Temp 1  MEWS Systolic 0  MEWS Pulse 0  MEWS RR 0  MEWS LOC 0  MEWS Score 1    Temp 100.5 Tylenol given.

## 2022-11-28 NOTE — Progress Notes (Addendum)
PROGRESS NOTE   Kelly Key  XBM:[id number] DOB: [birth year] DOA: 11/26/2022 PCP: [name], MD   Chief Complaint  Patient presents with   Shortness of Breath   Level of care: Telemetry Medical  Brief Admission History:  52 y.o. male with medical history significant of chronic diastolic CHF, COPD, ESRD, diabetes mellitus type 2, hepatic steatosis, hyperlipidemia, and more presents the ED with a chief complaint of dyspnea.  Patient reports that he has had some level of dyspnea since he was diagnosed with COVID.  Today it was worse.  He was walking across the room carrying a box of books when he suddenly felt short of breath.  He reports that any heavy lifting makes his dyspnea worse.  He reports that normally he would be able to carry heavy items, but he is a poor historian and it is difficult to know if he is talking about his new normal or before he had kidney disease.  Patient reports that he has had chest tightness.  It feels similar to when he has had fluid overload in the past.  Its diffuse across his chest.  It is exertional.  It is not there at this time.  Reports has had a nonproductive cough as well.  He had fevers yesterday, but none today.  Has had chills as well.  Patient complains of rhinorrhea that started yesterday evening.  Patient reports he does not wear oxygen at home.  Does currently make urine.   Patient does complain of muscle cramping which could be related to electrolytes   Patient does not smoke, does not use illicit drugs.  He does not drink.  He is vaccinated for COVID and flu.  Patient is full code.   Assessment and Plan: * Acute respiratory failure with hypoxia (HCC) - Likely related to COVID-19 and possibly some fluid overload - Chest x-ray shows pulmonary edema and pleural effusions of improved.  Mild residual vascular congestion.  Mild cardiomegaly.  Mild bibasilar atelectasis. - Patient has been COVID-positive since January 9 - Pt is feeling better after  hemodialysis 2/2 and being weaned to room air;  -- he is complaining of fever and chills today which is being worked up  [employer] gangrene left 2nd toe - Patient says he has been followed for this by his podiatrist Dr. Irving Shows in [place] seen in photos uploaded to EMR -Discussed with surgery recommended obtaining ID consultation as patient poor surgical candidate -Patient did successfully treat an osteomyelitis in the third toe of that foot 1 year ago with 6 weeks of IV antibiotics -Requested ID telemedicine consultation, extremity order set utilized -MRI completed with findings of osteomyelitis.  -Dr. Baxter Flattery recommended patient transfer to [facility] for surgical management -Pt has seen vascular surgeon Dr. Trula Slade in past for his fistula placement -I have reached out to Dr Virl Cagey on call for [facility] who agreed to see patient in consult at [facility] -Order placed to notify Dr. Virl Cagey when arrives at [facility].  -transfer orders placed -ID adjusted antibiotics for now -see consult notes  Fever, unspecified - Full-body exam today reveals patient has what appears to be wet gangrene in the left second toe with purulence seen, extremity order set started and broaden antibiotics and ID consultation requested after speaking with surgery -Blood cultures x 2 taken on 11/27/2022 --> no growth to date; continue to follow -ID recommended amputation for source control and proximal tissue culture in surgery  GERD (gastroesophageal reflux disease) - Continue Protonix  Hyperkalemia -  Treated with hemodialysis and resolved now  Diabetes mellitus type 2 in obese ([place]) - 50 units of insulin at baseline - Reduced dose of basal insulin here - Sliding scale coverage CBG (last 3)  Recent Labs    11/27/22 1629 11/27/22 2251 11/28/22 0751  GLUCAP 149* 232* 110*     COVID-19 virus infection - Out of the window for pharmaceutical - Supportive care  ESRD (end stage renal disease) [facility]) - Nephrology  arranging hemodialysis - Pt is on schedule with TTS HD treatments  - I notified Dr. Carolin Sicks on 2/2 regarding patient's campus change from [facility]  Chronic combined systolic and diastolic CHF (congestive heart failure) ([place]) - Continue aspirin, statin, Coreg, home dose of Lasix - Not clinically in exacerbation - Volume management with hemodialysis    DVT prophylaxis: Subcu heparin Code Status: Full Family Communication: Discussed plan of care with patient at bedside who verbalized understanding Disposition: Status is: Inpatient Remains inpatient appropriate because: IV antibiotics required for care   Consultants:  Infectious disease telemedicine General surgery curbside Procedures:  Hemodialysis 11/27/22  Antimicrobials:  Doxycycline 2/1-2/2 Metronidazole 2/2> Ceftriaxone 2/2> Vancomycin 2/2>   Subjective: Patient reports that he has been feeling fever and cold chills that have been intermittent and occurring spontaneously at odd times.  He is not having shortness of breath at this time.  But he does not feel well.  Objective: Vitals:   11/27/22 2253 11/28/22 0503 11/28/22 0618 11/28/22 0821  BP: 137/76 (!) 155/80    Pulse: 94 92    Resp: 20 20    Temp: 98.8 F (37.1 C) (!) 100.5 F (38.1 C) 99.4 F (37.4 C)   TempSrc:  Oral Oral   SpO2: 93% 93%  94%  Weight:      Height:        Intake/Output Summary (Last 24 hours) at 11/28/2022 1652 Last data filed at 11/27/2022 1845 Gross per 24 hour  Intake --  Output 4300 ml  Net -4300 ml   Filed Weights   11/26/22 2322 11/27/22 1325 11/27/22 1845  Weight: (!) 140.7 kg (!) 140.2 kg (!) 136.1 kg   Examination:  General exam: Chronically ill-appearing male, morbidly obese, appears calm and comfortable  Respiratory system: Clear to auscultation. Respiratory effort normal. Cardiovascular system: normal S1 & S2 heard. No JVD, murmurs, rubs, gallops or clicks. 1+ pedal edema. Gastrointestinal system: Abdomen is nondistended,  soft and nontender. No organomegaly or masses felt. Normal bowel sounds heard. Central nervous system: Alert and oriented. No focal neurological deficits. Extremities: wet gangrenous appearance of left 2nd toe with purulence seen;      Skin: see extremity. Psychiatry: Judgement and insight appear normal. Mood & affect appropriate.   Data Reviewed: I have personally reviewed following labs and imaging studies  CBC: Recent Labs  Lab 11/26/22 1851 11/27/22 1200 11/28/22 0831  WBC 7.3 7.9 7.1  NEUTROABS 6.3 5.6 3.7  HGB 8.8* 8.1* 8.7*  HCT 27.6* 25.5* 27.5*  MCV 89.9 88.9 89.3  PLT 211 212 294    Basic Metabolic Panel: Recent Labs  Lab 11/26/22 1851 11/27/22 1200 11/28/22 0448  NA 131* 132* 129*  K 5.6* 4.2 4.0  CL 92* 92* 93*  CO2 26 24 26   GLUCOSE 251* 150* 130*  BUN 66* 75* 52*  CREATININE 9.55* 10.77* 8.47*  CALCIUM 8.2* 8.1* 8.1*  MG  --  2.3  --   PHOS  --   --  3.8    CBG: Recent Labs  Lab 11/27/22 1629 11/27/22 2251 11/28/22 0751 11/28/22 1300 11/28/22 1627  GLUCAP 149* 232* 110* 147* 239*    Recent Results (from the past 240 hour(s))  Resp panel by RT-PCR (RSV, Flu A&B, Covid) Anterior Nasal Swab     Status: Abnormal   Collection Time: 11/26/22  7:58 PM   Specimen: Anterior Nasal Swab  Result Value Ref Range Status   SARS Coronavirus 2 by RT PCR POSITIVE (A) NEGATIVE Final    Comment: (NOTE) SARS-CoV-2 target nucleic acids are DETECTED.  The SARS-CoV-2 RNA is generally detectable in upper respiratory specimens during the acute phase of infection. Positive results are indicative of the presence of the identified virus, but do not rule out bacterial infection or co-infection with other pathogens not detected by the test. Clinical correlation with patient history and other diagnostic information is necessary to determine patient infection status. The expected result is Negative.  Fact Sheet for  Patients: EntrepreneurPulse.com.au  Fact Sheet for Healthcare Providers: IncredibleEmployment.be  This test is not yet approved or cleared by the [place] FDA and  has been authorized for detection and/or diagnosis of SARS-CoV-2 by FDA under an Emergency Use Authorization (EUA).  This EUA will remain in effect (meaning this test can be used) for the duration of  the COVID-19 declaration under Section 564(b)(1) of the A ct, 21 U.S.C. section 360bbb-3(b)(1), unless the authorization is terminated or revoked sooner.     Influenza A by PCR NEGATIVE NEGATIVE Final   Influenza B by PCR NEGATIVE NEGATIVE Final    Comment: (NOTE) The Xpert Xpress SARS-CoV-2/FLU/RSV plus assay is intended as an aid in the diagnosis of influenza from Nasopharyngeal swab specimens and should not be used as a sole basis for treatment. Nasal washings and aspirates are unacceptable for Xpert Xpress SARS-CoV-2/FLU/RSV testing.  Fact Sheet for Patients: EntrepreneurPulse.com.au  Fact Sheet for Healthcare Providers: IncredibleEmployment.be  This test is not yet approved or cleared by the [place] FDA and has been authorized for detection and/or diagnosis of SARS-CoV-2 by FDA under an Emergency Use Authorization (EUA). This EUA will remain in effect (meaning this test can be used) for the duration of the COVID-19 declaration under Section 564(b)(1) of the Act, 21 U.S.C. section 360bbb-3(b)(1), unless the authorization is terminated or revoked.     Resp Syncytial Virus by PCR NEGATIVE NEGATIVE Final    Comment: (NOTE) Fact Sheet for Patients: EntrepreneurPulse.com.au  Fact Sheet for Healthcare Providers: IncredibleEmployment.be  This test is not yet approved or cleared by the [place] FDA and has been authorized for detection and/or diagnosis of SARS-CoV-2 by FDA under an Emergency Use  Authorization (EUA). This EUA will remain in effect (meaning this test can be used) for the duration of the COVID-19 declaration under Section 564(b)(1) of the Act, 21 U.S.C. section 360bbb-3(b)(1), unless the authorization is terminated or revoked.  Performed at [facility], [address], [place], [place] [address]   Culture, blood (Routine X 2) w Reflex to ID Panel     Status: None (Preliminary result)   Collection Time: 11/27/22  1:55 PM   Specimen: BLOOD  Result Value Ref Range Status   Specimen Description BLOOD aline  Final   Special Requests   Final    BOTTLES DRAWN AEROBIC AND ANAEROBIC Blood Culture adequate volume   Culture   Final    NO GROWTH < 24 HOURS Performed at [facility], [address], [place], [place] [address]    Report Status PENDING  Incomplete  Culture,  blood (Routine X 2) w Reflex to ID Panel     Status: None (Preliminary result)   Collection Time: 11/27/22  1:58 PM   Specimen: BLOOD  Result Value Ref Range Status   Specimen Description BLOOD vline  Final   Special Requests   Final    BOTTLES DRAWN AEROBIC AND ANAEROBIC Blood Culture adequate volume   Culture   Final    NO GROWTH < 24 HOURS Performed at [facility], [address], [place], [place] [address]    Report Status PENDING  Incomplete     Radiology Studies: MR FOOT LEFT WO CONTRAST  Result Date: 11/28/2022 CLINICAL DATA:  Foot swelling, diabetic, osteomyelitis suspected. Concern for wet gangrene of the left 2nd toe. EXAM: MRI OF THE LEFT FOOT WITHOUT CONTRAST TECHNIQUE: Multiplanar, multisequence MR imaging of the left forefoot was performed. No intravenous contrast was administered. COMPARISON:  Previous MRI 11/06/2021. No comparison radiographs available. FINDINGS: Despite efforts by the technologist and patient, mild-to-moderate motion artifact is present on today's exam and could not be eliminated. This reduces exam sensitivity and specificity. Motion is greatest on the sagittal images  which are essentially nondiagnostic. Bones/Joint/Cartilage Evaluation of the distal phalanges is limited by clawtoe deformities and the lack of diagnostic sagittal images. The distal 2nd phalanx appears abnormal with diffusely decreased T1 and increased T2 signal. In addition, there is probable cortical destruction. The proximal and middle phalanges demonstrate nonspecific low-level marrow T2 hyperintensity without T1 signal abnormality. Presumed interval resection of the distal 3rd and 4th phalanges. No acute findings are seen within the great or small toes. There are degenerative changes at the 1st interphalangeal and metatarsophalangeal joints. No significant joint effusions. Ligaments The collateral ligaments at the metatarsophalangeal joints are intact. The Lisfranc ligament is intact. Muscles and Tendons Chronic generalized fatty atrophy throughout the forefoot musculature, similar to previous study. Mild tenosynovitis of the flexor tendon of the 2nd toe. The extensor tendons appear unremarkable. Soft tissues Nonspecific dorsal subcutaneous edema throughout the forefoot. There is generalized soft tissue swelling in the 2nd toe which appears greatest distally. Cannot exclude a small amount of soft tissue emphysema within the distal aspect of the 2nd toe. No focal fluid collection identified. IMPRESSION: 1. Examination is limited by motion, especially on the sagittal images. Plain film correlation and correlation with prior surgical history recommended. 2. The distal 2nd phalanx appears abnormal with probable cortical destruction and marrow edema, suspicious for osteomyelitis or osteonecrosis. 3. The proximal and middle phalanges of the 2nd toe demonstrate nonspecific low-level marrow T2 hyperintensity without T1 signal abnormality which could be reactive. 4. Diffuse soft tissue swelling throughout the 2nd toe consistent with soft tissue infection. Cannot exclude a small amount of soft tissue emphysema distally  which could indicate necrotizing infection (gangrene). No focal fluid collection identified. Mild flexor tenosynovitis of the 2nd toe. 5. Presumed interval resection of the distal 3rd and 4th phalanges. Electronically Signed   By: Richardean Sale M.D.   On: 11/28/2022 13:02   US ARTERIAL ABI (SCREENING LOWER EXTREMITY)  Result Date: 11/28/2022 CLINICAL DATA:  Toe gangrene EXAM: NONINVASIVE PHYSIOLOGIC VASCULAR STUDY OF BILATERAL LOWER EXTREMITIES TECHNIQUE: Evaluation of both lower extremities were performed at rest, including calculation of ankle-brachial indices with single level pressure measurements and doppler recording. COMPARISON:  None available. FINDINGS: Right ABI:  1.12 Left ABI:  0.89 Right Lower Extremity: Multiphasic waveform in posterior tibial artery. Dorsalis pedis is monophasic. Left Lower Extremity: Posterior tibial and dorsalis pedis are monophasic. 0.8-0.89 Mild PAD  IMPRESSION: Mild left lower extremity PAD. Electronically Signed   By: Miachel Roux M.D.   On: 11/28/2022 10:08   DG CHEST PORT 1 VIEW  Result Date: 11/28/2022 CLINICAL DATA:  Dyspnea and fever, COVID infection EXAM: PORTABLE CHEST 1 VIEW COMPARISON:  11/26/2022 FINDINGS: Upper normal heart size for technique. Previous cephalization of blood flow in indistinct pulmonary vasculature has improved, currently with no compelling findings of pulmonary venous hypertension or edema. Atherosclerotic calcification of the aortic arch. No blunting of the costophrenic angles. Mild thoracic spondylosis. IMPRESSION: 1. Upper normal heart size for technique. No compelling findings of pulmonary venous hypertension or edema. 2. Mild thoracic spondylosis. Electronically Signed   By: Van Clines M.D.   On: 11/28/2022 07:36   DG Chest Port 1 View  Result Date: 11/26/2022 CLINICAL DATA:  Shortness of breath. EXAM: PORTABLE CHEST 1 VIEW COMPARISON:  11/04/2022 FINDINGS: Improved pulmonary edema from prior exam. There is mild vascular  congestion. Mild cardiomegaly appears similar. Decreased bilateral pleural effusions. Mild bibasilar atelectasis without confluent airspace disease. No pneumothorax. IMPRESSION: 1. Improved pulmonary edema and pleural effusions from prior exam. Mild residual vascular congestion. Mild cardiomegaly is similar. 2. Mild bibasilar atelectasis. Electronically Signed   By: Keith Rake M.D.   On: 11/26/2022 18:31    Scheduled Meds:  (feeding supplement) PROSource Plus  30 mL Oral TID WC   amLODipine  5 mg Oral Daily   ascorbic acid  500 mg Oral Daily   aspirin EC  81 mg Oral Daily   atorvastatin  80 mg Oral Daily   [START ON 11/29/2022] calcitRIOL  2.5 mcg Oral Q T,Th,Sa-HD   carvedilol  25 mg Oral BID WC   Chlorhexidine Gluconate Cloth  6 each Topical Q0600   [START ON 11/29/2022] Chlorhexidine Gluconate Cloth  6 each Topical Q0600   cloNIDine  0.3 mg Oral TID   ezetimibe  10 mg Oral QPM   furosemide  80 mg Oral Daily   gabapentin  100 mg Oral TID   guaiFENesin-dextromethorphan  10 mL Oral Q8H   heparin  5,000 Units Subcutaneous Q8H   insulin aspart  0-15 Units Subcutaneous TID WC   insulin aspart  0-5 Units Subcutaneous QHS   insulin detemir  40 Units Subcutaneous QHS   Ipratropium-Albuterol  1 puff Inhalation BID   metroNIDAZOLE  500 mg Oral Q12H   multivitamin  1 tablet Oral QHS   nutrition supplement (JUVEN)  1 packet Oral BID BM   pantoprazole  40 mg Oral Daily   sevelamer carbonate  2,400 mg Oral TID WC   zinc sulfate  220 mg Oral Daily   Continuous Infusions:  cefTAZidime (FORTAZ)  IV     [START ON 11/29/2022] cefTAZidime (FORTAZ)  IV     [START ON 11/29/2022] vancomycin       LOS: 1 day   Time spent: 45 mins  Kelly Smitherman Wynetta Emery, MD How to contact the [facility] Attending or Consulting provider [place] or covering provider during after hours [place], for this patient?  Check the care team in [facility] and look for a) attending/consulting TR[facility] provider listed and b) the [facility] team listed Log into  [email or web address] and use 's universal password to access. If you do not have the password, please contact the hospital operator. Locate the [facility] provider you are looking for under [facility] and page to a number that you can be directly reached. If you still have difficulty reaching the provider, please page the [facility] (Director  on Call) for the Hospitalists listed on amion for assistance.  11/28/2022, 4:52 PM

## 2022-11-28 NOTE — Consult Note (Signed)
[facility] Renal Consultation Note  Requesting MD: Irwin Brakeman, MD Indication for Consultation:  ESRD  Chief complaint: shortness of breath  HPI:  Kelly Key is a 52 y.o. male with a history of ESRD, chronic diastolic CHF, COPD, DM, gout, HTN, and obesity who presented to the ER for shortness of breath.  Note that he was recently hospitalized with covid 1/9-1/10/24.  He was also hospitalized 12/22-12/23/3 for acute supraglottitis.  Nephrology is consulted for management of his hemodialysis.  He had HD here on 2/1 with 4.3 kg UF.  He was previously requiring supplemental oxygen and has been weaned to room air.  I called his outpatient dialysis unit. His last HD treatment was 11/25/22 and RN was not able to locate departing weight.  He has also had n/v.  He states that now they are concerned that his toe is infected and he has been told that one toe on his left foot may need to come off.  He states his toe has turned black.   PMHx:   Past Medical History:  Diagnosis Date   Acute diastolic CHF (congestive heart failure) ([place]) 06/11/2012   EF 50-55% [facility])   Chronic kidney disease    COPD (chronic obstructive pulmonary disease) (HCC)    Diabetes mellitus    A1c 11.5 (06/11/2012).   Dyspnea    Gout    Hepatic steatosis 06/11/2012   Elevated LFTs   Hyperlipemia    Malignant hypertension    Microcytic anemia 06/12/2012   Obesity     Past Surgical History:  Procedure Laterality Date   AV FISTULA PLACEMENT Left 11/11/2019   Procedure: ARTERIOVENOUS (AV) FISTULA CREATION LEFT BRACHIOCEPHALIC ARM;  Surgeon: Serafina Mitchell, MD;  Location: [facility];  Service: Vascular;  Laterality: Left;   COLONOSCOPY WITH PROPOFOL N/A 09/02/2021   Procedure: COLONOSCOPY WITH PROPOFOL;  Surgeon: Jonathon Bellows, MD;  Location: [facility];  Service: Gastroenterology;  Laterality: N/A;   IR FLUORO GUIDE CV LINE RIGHT  07/08/2019   IR REMOVAL TUN CV CATH W/O FL  03/23/2020   IR US  GUIDE VASC ACCESS RIGHT  07/08/2019   LEFT HEART CATH AND CORONARY ANGIOGRAPHY N/A 06/12/2021   Procedure: LEFT HEART CATH AND CORONARY ANGIOGRAPHY;  Surgeon: Belva Crome, MD;  Location: [facility];  Service: Cardiovascular;  Laterality: N/A;   NO PAST SURGERIES     VASCULAR SURGERY      Family Hx:  Family History  Problem Relation Age of Onset   Gout Mother    Asthma Mother    Diabetes Father    Heart failure Father    Diabetes Sister    Hypertension Brother    Pancreatic cancer Brother    Diabetes Sister     Social History:  reports that he has never smoked. He has been exposed to tobacco smoke. He has never used smokeless tobacco. He reports that he does not currently use alcohol. He reports that he does not use drugs.  Allergies:  Allergies  Allergen Reactions   Ozempic (0.25 Or 0.5 Mg-Dose) [Semaglutide(0.25 Or 0.5mg -Dos)] Shortness Of Breath   Hydralazine Anxiety    Hallucinations    Lisinopril Swelling    Medications: Prior to Admission medications   Medication Sig Start Date End Date Taking? Authorizing Provider  acetaminophen (TYLENOL) 500 MG tablet Take 1,000 mg by mouth every 6 (six) hours as needed for moderate pain or headache.    [provider]  albuterol (VENTOLIN HFA) 108 (90  Base) MCG/ACT inhaler Inhale 2 puffs into the lungs every 4 (four) hours as needed for wheezing or shortness of breath.  03/24/19   [provider]  allopurinol (ZYLOPRIM) 100 MG tablet Take 2 tablets (200 mg total) by mouth daily. 08/04/19   Love, Ivan Anchors, PA-C  AMBULATORY NON FORMULARY MEDICATION Inject 0.2 ml by intracavernosal route as directed.  Medication Name: TriMix PGE 10 mcg Pap   30 mg Phent 1 mg 01/01/22   Stoneking, Reece Leader., MD  amLODipine (NORVASC) 5 MG tablet Take 5 mg by mouth daily. 01/30/22   [provider]  ammonium lactate (AMLACTIN) 12 % cream Apply 1 Application topically in the morning and at bedtime. 09/08/22   [provider]  ascorbic acid (VITAMIN C) 500 MG tablet Take 1 tablet (500 mg total) by mouth daily. 11/06/22 12/06/22  Manuella Ghazi, Pratik D, DO  aspirin EC 81 MG EC tablet Take 1 tablet (81 mg total) by mouth daily. 08/04/19   Love, Ivan Anchors, PA-C  atorvastatin (LIPITOR) 80 MG tablet Take 1 tablet (80 mg total) by mouth daily. 12/05/19   Arnoldo Lenis, MD  Calcium Carbonate Antacid (CALCIUM CARBONATE, DOSED IN MG ELEMENTAL CALCIUM,) 1250 MG/5ML SUSP Take 5 mLs (500 mg of elemental calcium total) by mouth every 6 (six) hours as needed for indigestion. 07/16/19   Amin, Jeanella Flattery, MD  carvedilol (COREG) 25 MG tablet Take 1 tablet (25 mg total) by mouth 2 (two) times daily with a meal. 08/03/19   Love, Ivan Anchors, PA-C  cloNIDine (CATAPRES) 0.3 MG tablet Take 0.3 mg by mouth 3 (three) times daily.    [provider]  esomeprazole (NEXIUM) 20 MG capsule Take 20 mg by mouth daily. 01/23/22   [provider]  ezetimibe (ZETIA) 10 MG tablet Take 10 mg by mouth every evening. 01/20/19   [provider]  fluticasone (FLONASE) 50 MCG/ACT nasal spray Place 1 spray into both nostrils daily as needed for allergies or rhinitis.    [provider]  furosemide (LASIX) 80 MG tablet Take 1 tablet (80 mg total) by mouth daily. 05/16/21   Shahmehdi, Valeria Batman, MD  gabapentin (NEURONTIN) 100 MG capsule Take 1 capsule (100 mg total) by mouth 3 (three) times daily. 08/03/19   Love, Ivan Anchors, PA-C  glucose blood (ACCU-CHEK AVIVA PLUS) test strip Use as instructed TO CHECK BLOOD GLUCOSE THREE TIMES DAILY 08/15/20   Nida, Marella Chimes, MD  guaiFENesin-dextromethorphan (ROBITUSSIN DM) 100-10 MG/5ML syrup Take 10 mLs by mouth every 8 (eight) hours. 11/05/22   Manuella Ghazi, Pratik D, DO  HUMALOG KWIKPEN 100 UNIT/ML KwikPen Inject 8-14 Units into the skin 3 (three) times daily. 08/19/22   [provider]  hydrOXYzine (ATARAX) 25 MG tablet Take 25 mg by mouth 3 (three) times daily as needed for anxiety.  11/28/21   [provider]  Insulin Pen Needle (B-D ULTRAFINE III SHORT PEN) 31G X 8 MM MISC 1 each by Does not apply route as directed. 08/13/20   Cassandria Anger, MD  Ipratropium-Albuterol (COMBIVENT) 20-100 MCG/ACT AERS respimat Inhale 1 puff into the lungs every 6 (six) hours as needed for wheezing or shortness of breath. 11/05/22 12/05/22  Manuella Ghazi, Pratik D, DO  LEVEMIR FLEXTOUCH 100 UNIT/ML FlexTouch Pen INJECT 50 UNITS SUBCUTANEOUSLY AT BEDTIME Patient taking differently: Inject 50 Units into the skin at bedtime. 07/09/21   Brita Romp, NP  linaclotide [facility]) 145 MCG CAPS capsule Take 1 capsule (145 mcg total) by mouth daily  before breakfast. Patient not taking: Reported on 11/04/2022 04/05/18   Heath Lark D, DO  multivitamin (RENA-VIT) TABS tablet Take 1 tablet by mouth at bedtime. 08/03/19   Love, Ivan Anchors, PA-C  mupirocin ointment (BACTROBAN) 2 % Apply 1 Application topically 2 (two) times daily.    [provider]  OZEMPIC, 1 MG/DOSE, 4 MG/3ML SOPN Inject 1 mg into the skin once a week. Fridays.    [provider]  sevelamer carbonate (RENVELA) 800 MG tablet Take 3 tablets (2,400 mg total) by mouth 3 (three) times daily with meals. 08/03/19   Love, Ivan Anchors, PA-C  vardenafil (LEVITRA) 20 MG tablet Take 1 tablet (20 mg total) by mouth daily as needed for erectile dysfunction. 08/16/21   Stoneking, Reece Leader., MD  zinc sulfate 220 (50 Zn) MG capsule Take 1 capsule (220 mg total) by mouth daily. 11/06/22 12/06/22  Heath Lark D, DO    I have reviewed the patient's current and reported prior to admission medications.  Labs:     Latest Ref Rng & Units 11/28/2022    4:48 AM 11/27/2022   12:00 PM 11/26/2022    6:51 PM  BMP  Glucose 70 - 99 mg/dL 130  150  251   BUN 6 - 20 mg/dL 52  75  66   Creatinine 0.61 - 1.24 mg/dL 8.47  10.77  9.55   Sodium 135 - 145 mmol/L 129  132  131   Potassium 3.5 - 5.1 mmol/L 4.0  4.2  5.6   Chloride 98 - 111 mmol/L 93  92  92    CO2 22 - 32 mmol/L 26  24  26    Calcium 8.9 - 10.3 mg/dL 8.1  8.1  8.2      ROS:  Pertinent items noted in HPI and remainder of comprehensive ROS otherwise negative.  Physical Exam: Vitals:   11/28/22 0618 11/28/22 0821  BP:    Pulse:    Resp:    Temp: 99.4 F (37.4 C)   SpO2:  94%     General: adult male in bed in NAD  HEENT: NCAT Eyes: EOMI sclera anicteric Neck: supple trachea midline Heart: S1S2 no rub Lungs: clear and reduced unlabored on room air  Abdomen: soft/nt/obese habitus Extremities: trace edema lower extremities; socks on  Skin: no rash on extremities exposed Neuro: alert and oriented x 3 provides hx and follows commands Psych normal mood and affect Access LUE AVF bruit and thrill   Outpatient HD orders:  [facility] T[facility] Time 4 hrs 15 min BF 450 DF 500 EDW 138.5 kg  [place] 3k/2.5 calcium Gets heparin at loading dose of 2600 units then hourly dose of 1000 units/hr Meds:   Mircera 30 mcg (last given on 11/25/22) venofer 50 mg weekly (last given on 11/25/22) Calcitriol 2.5 mcg three days a week  Assessment/Plan:  # ESRD  - HD per TTS schedule  - as below may need to challenge EDW   # Covid PNA  - Per primary team  - note isolation precautions are ordered   # Acute hypoxic respiratory failure  - 2/2 covid as well as overload  - optimize volume with HD - supportive care per primary team  - may have lost weight in the setting of covid  # HTN  - Acceptable control   # Hyperkalemia - improved with HD and on a renal diet  # Anemia of CKD  - note mircera was recently dosed as an outpatient   #  Wet gangrene, left 2nd toe - per primary team   # Metabolic bone disease  - phos and calcium acceptable; resume outpatient meds and continue sevelamer    Disposition per primary team.  Nephrology team will review his labs over the weekend - please reach out if any issues or questions.   Claudia Desanctis 11/28/2022, 1:14 PM

## 2022-11-28 NOTE — Progress Notes (Addendum)
Initial Nutrition Assessment  DOCUMENTATION CODES:   Morbid obesity  INTERVENTION:   -ProSource Plus  30 ml TID (each 30 ml provides 100 kcal, 15 gr protein)   -1 packet Juven BID, each packet provides 95 calories, 2.5 grams of protein (collagen), and 9.8 grams of carbohydrate (3 grams sugar); to support wound healing   -Renal vitamin daily  NUTRITION DIAGNOSIS:   Increased nutrient needs related to wound healing as evidenced by estimated needs.   GOAL:   Pt to meet >/= 90% of their estimated nutrition needs     MONITOR:  PO intake, Supplement acceptance, Skin, Labs, Weight trends, I & O's  REASON FOR ASSESSMENT:   Consult Wound healing  ASSESSMENT: [name] with hx of ESRD, anemia of CKD, CHF, COPD, DM, GERD,Gout, wet gangrene left 2nd toe, metabolic bone disease, obesity.   Presents with shortness of breath, acute respiratory failure, COVID positive. Reports good appetite and independent at baseline. Encouraged protein with each meal and acceptance of ONS. Discussed increased energy and protein needs with HD, COVID and wounds.  Hemodialysis on 2/1 - UF 4.3 kg per nephrology. EDW-138.5 kg   Medications reviewed. Vitamin C, Calcitriol, Lasix, Novolog, Levimir, Flagyl.      Latest Ref Rng & Units 11/28/2022    4:48 AM 11/27/2022   12:00 PM 11/26/2022    6:51 PM  BMP  Glucose 70 - 99 mg/dL 130  150  251   BUN 6 - 20 mg/dL 52  75  66   Creatinine 0.61 - 1.24 mg/dL 8.47  10.77  9.55   Sodium 135 - 145 mmol/L 129  132  131   Potassium 3.5 - 5.1 mmol/L 4.0  4.2  5.6   Chloride 98 - 111 mmol/L 93  92  92   CO2 22 - 32 mmol/L 26  24  26    Calcium 8.9 - 10.3 mg/dL 8.1  8.1  8.2        NUTRITION - FOCUSED PHYSICAL EXAM:  Flowsheet Row Most Recent Value  Orbital Region No depletion  Upper Arm Region No depletion  Thoracic and Lumbar Region No depletion  Buccal Region Mild depletion  Temple Region No depletion  Clavicle Bone Region No depletion  Clavicle  and Acromion Bone Region No depletion  Scapular Bone Region No depletion  Dorsal Hand No depletion  Patellar Region No depletion  Anterior Thigh Region No depletion  Posterior Calf Region No depletion  Edema (RD Assessment) Mild  Hair Reviewed  [balding]  Eyes Reviewed  Mouth Reviewed  [poor dentition]  Skin Reviewed  Nails Reviewed       Diet Order:   Diet Order             Diet renal with fluid restriction Fluid restriction: 1200 mL Fluid; Room service appropriate? Yes; Fluid consistency: Thin  Diet effective now                   EDUCATION NEEDS:  Education needs have been addressed  Skin:  Skin Assessment: Skin Integrity Issues: Skin Integrity Issues:: Diabetic Ulcer Diabetic Ulcer: right and left foot per nursing  Last BM:  1/31  Height:   Ht Readings from Last 1 Encounters:  11/26/22 5\' 6"  (1.676 m)    Weight:   Wt Readings from Last 1 Encounters:  11/27/22 (!) 136.1 kg    Ideal Body Weight:   65 kg  BMI:  Body mass index is 48.43 kg/m.  Estimated Nutritional  Needs:   Kcal:  2300-2500  Protein:  125-130 gr protein  Fluid:  1000 ml plus urine output  Colman Cater MS,RD,CSG,LDN Contact: AMION

## 2022-11-28 NOTE — Hospital Course (Signed)
Kelly Key is a 52 y.o. male with PMH chronic dCHF, COPD, ESRD on HD, DMII, hepatic steatosis, HLD, and more who presented with dyspnea.  Patient reports that he has had some level of dyspnea since he was diagnosed with COVID.  He also began developing fevers at home and a mild nonproductive cough. He also has had an ongoing wound involving his left foot second digit. Exam was concerning for toe gangrene.  He was admitted for IV antibiotics and evaluation with orthopedic surgery and vascular surgery.

## 2022-11-28 NOTE — Progress Notes (Addendum)
Pharmacy Antibiotic Note  Kelly Key is a 52 y.o. male admitted on 11/26/2022 with  Diabetic foot infection/osteo .  Pharmacy has been consulted for Vancomycin dosing.  Plan: Vancomycin 2000mg  IV loading dose, then 1000mg  after QHD Also, ceftazidime 2000 mg IV every HD Flagyl 500mg  po BID F/U cxs and clinical progress Monitor V/S, labs and levels as indicated  Height: 5\' 6"  (167.6 cm) Weight: (!) 136.1 kg (300 lb 0.7 oz) IBW/kg (Calculated) : 63.8  Temp (24hrs), Avg:99.4 F (37.4 C), Min:98.1 F (36.7 C), Max:101 F (38.3 C)  Recent Labs  Lab 11/26/22 1851 11/27/22 1200 11/28/22 0448 11/28/22 0831  WBC 7.3 7.9  --  7.1  CREATININE 9.55* 10.77* 8.47*  --     Estimated Creatinine Clearance: 13.2 mL/min (A) (by C-G formula based on SCr of 8.47 mg/dL (H)).    Allergies  Allergen Reactions   Ozempic (0.25 Or 0.5 Mg-Dose) [Semaglutide(0.25 Or 0.5mg -Dos)] Shortness Of Breath   Hydralazine Anxiety    Hallucinations    Lisinopril Swelling    Antimicrobials this admission: vancomycin 2/2 >>  Ceftriaxone  2/2  Ceftazidime 2/2 >> Metronidazole 2/2>>  Microbiology results: 2/1 BCx: pending  Thank you for allowing pharmacy to be a part of this patient's care.

## 2022-11-28 NOTE — Progress Notes (Signed)
Report given to Mable Fill, RN at [facility] [address] at bedside. Bryson Corona Edd Fabian

## 2022-11-28 NOTE — Assessment & Plan Note (Addendum)
-   Full-body exam today reveals patient has what appears to be wet gangrene in the left second toe with purulence seen, extremity order set started and broaden antibiotics and ID consultation requested after speaking with surgery -Blood cultures x 2 taken on 11/27/2022 --> no growth to date; continue to follow -ID recommended amputation for source control and proximal tissue culture in surgery

## 2022-11-28 NOTE — Assessment & Plan Note (Addendum)
-   Patient says he has been followed for this by his podiatrist Dr. Irving Shows in [place] seen in photos uploaded to EMR -Discussed with surgery recommended obtaining ID consultation as patient poor surgical candidate -Patient did successfully treat an osteomyelitis in the third toe of that foot 1 year ago with 6 weeks of IV antibiotics -Requested ID telemedicine consultation, extremity order set utilized -MRI completed with findings of osteomyelitis.  -Dr. Baxter Flattery recommended patient transfer to [facility] for surgical management -Pt has seen vascular surgeon Dr. Trula Slade in past for his fistula placement -I have reached out to Dr Virl Cagey on call for [facility] who agreed to see patient in consult at [place] placed to notify Dr. Virl Cagey when arrives at [facility].  -transfer orders placed -ID adjusted antibiotics for now -see consult notes

## 2022-11-28 NOTE — TOC Initial Note (Signed)
Transition of Care [facility]) - Initial/Assessment Note    Patient Details  Name: Kelly Key MRN: [id number] Date of Birth: November 01, 1970  Transition of [facility]) CM/SW Contact:    Salome Arnt, LCSW Phone Number: 11/28/2022, 1:44 PM  Clinical Narrative: Pt admitted for acute respiratory failure with hypoxia. Assessment completed due to high risk readmission score. Pt lives alone. His girlfriend, Jolayne Haines is best support and checks on pt daily. Per sister, Hilda Blades, pt is independent with ADLs. He has a cane, walker, and wheelchair in the home. Pt has transportation to appointments. His dialysis is TTS at [facility]. TOC received consult for HH/DME/medication assistance. Per MD, no needs at this point but may need closer to d/c. TOC will follow.                   Expected Discharge Plan: Home/Self Care Barriers to Discharge: Continued Medical Work up   Patient Goals and CMS Choice Patient states their goals for this hospitalization and ongoing recovery are:: return home   Choice offered to / list presented to : Patient      Expected Discharge Plan and Services In-house Referral: Clinical Social Work     Living arrangements for the past 2 months: Single Family Home                                      Prior Living Arrangements/Services Living arrangements for the past 2 months: Single Family Home Lives with:: Self Patient language and need for interpreter reviewed:: Yes Do you feel safe going back to the place where you live?: Yes          Current home services: DME (cane, walker, wheelchair) Criminal Activity/Legal Involvement Pertinent to Current Situation/Hospitalization: No - Comment as needed  Activities of Daily Living Home Assistive Devices/Equipment: Bathtub lift ADL Screening (condition at time of admission) Patient's cognitive ability adequate to safely complete daily activities?: Yes Is the patient deaf or have difficulty hearing?: No Does the  patient have difficulty seeing, even when wearing glasses/contacts?: No Does the patient have difficulty concentrating, remembering, or making decisions?: No Patient able to express need for assistance with ADLs?: Yes Does the patient have difficulty dressing or bathing?: No Independently performs ADLs?: Yes (appropriate for developmental age) Communication: Independent Dressing (OT): Independent Grooming: Independent Feeding: Independent Bathing: Independent Toileting: Independent In/Out Bed: Independent Walks in Home: Independent Does the patient have difficulty walking or climbing stairs?: Yes Weakness of Legs: None Weakness of Arms/Hands: None  Permission Sought/Granted                  Emotional Assessment         Alcohol / Substance Use: Not Applicable Psych Involvement: No (comment)  Admission diagnosis:  Acute respiratory failure with hypoxia ([place]) [J96.01] Patient Active Problem List   Diagnosis Date Noted   Wet gangrene left 2nd toe 11/28/2022   Diabetes mellitus type 2 in obese ([place]) 11/27/2022   Hyperkalemia 11/27/2022   GERD (gastroesophageal reflux disease) 11/27/2022   Fever, unspecified 11/27/2022   Acute respiratory failure with hypoxia ([place]) 11/26/2022   COVID-19 virus infection 11/04/2022   Flash pulmonary edema ([place]) 11/04/2022   Dysphagia 10/18/2022   Osteomyelitis ([place]) 11/13/2021   OSA (obstructive sleep apnea) 10/04/2021   Obesity hypoventilation syndrome ([place]) 10/04/2021   Organic impotence 08/16/2021   NSTEMI (non-ST elevated myocardial infarction) ([place]) 05/17/2021   Acute gout  of right knee 08/03/2019   Orthostasis    Pain    Physical debility 07/16/2019   ESRD (end stage renal disease) ([place])    Anemia of chronic disease    Uncontrolled type 2 diabetes mellitus with hyperglycemia (HCC)    Volume overload 07/05/2019   Anasarca 07/03/2019   Dyslipidemia 07/03/2019   Chronic combined systolic and diastolic CHF (congestive heart  failure) ([place]) 07/03/2019   Bilateral leg edema    Hypokalemia    Thromboembolism ([place]) 02/20/2019   Diabetes mellitus type 2 in nonobese ([place]) 06/12/2012   Hyponatremia 06/12/2012   Microcytic anemia 06/12/2012   Essential hypertension, benign 06/11/2012   Elevated LFTs 06/11/2012   Morbid obesity ([place]) 06/11/2012   Hepatic steatosis 06/11/2012   PCP:  [name], MD Pharmacy:   [facility] [address], [place] [address] Phone: [phone] Fax: [place], [place] [address] [place] [address] Phone: [phone] Fax: [phone]     Social Determinants of Health (SDOH) Social History: SDOH Screenings   Food Insecurity: No Food Insecurity (11/26/2022)  Housing: Low Risk  (11/26/2022)  Transportation Needs: No Transportation Needs (11/26/2022)  Utilities: Not At Risk (11/26/2022)  Depression (PHQ2-9): Low Risk  (12/25/2021)  Financial Resource Strain: Low Risk  (02/20/2019)  Physical Activity: Unknown (02/20/2019)  Social Connections: Unknown (02/20/2019)  Stress: No Stress Concern Present (02/20/2019)  Tobacco Use: Low Risk  (11/26/2022)   SDOH Interventions:     Readmission Risk Interventions    11/28/2022    1:43 PM 11/04/2022    1:04 PM 05/15/2021   11:21 AM  Readmission Risk Prevention Plan  Transportation Screening Complete Complete Complete  Medication Review Press photographer) Complete Complete Complete  HRI or Home Care Consult Complete Complete Complete  SW Recovery Care/Counseling Consult Complete Complete Complete  Palliative Care Screening Not Applicable Not Applicable Not Applicable  Skilled Nursing Facility Not Applicable Not Applicable Not Applicable

## 2022-11-29 DIAGNOSIS — I96 Gangrene, not elsewhere classified: Secondary | ICD-10-CM

## 2022-11-29 DIAGNOSIS — N186 End stage renal disease: Secondary | ICD-10-CM | POA: Diagnosis not present

## 2022-11-29 DIAGNOSIS — N189 Chronic kidney disease, unspecified: Secondary | ICD-10-CM

## 2022-11-29 DIAGNOSIS — M869 Osteomyelitis, unspecified: Secondary | ICD-10-CM | POA: Diagnosis not present

## 2022-11-29 DIAGNOSIS — J9601 Acute respiratory failure with hypoxia: Secondary | ICD-10-CM | POA: Diagnosis not present

## 2022-11-29 LAB — RENAL FUNCTION PANEL
Albumin: 2.6 g/dL — ABNORMAL LOW (ref 3.5–5.0)
Anion gap: 16 — ABNORMAL HIGH (ref 5–15)
BUN: 70 mg/dL — ABNORMAL HIGH (ref 6–20)
CO2: 23 mmol/L (ref 22–32)
Calcium: 8 mg/dL — ABNORMAL LOW (ref 8.9–10.3)
Chloride: 90 mmol/L — ABNORMAL LOW (ref 98–111)
Creatinine, Ser: 10.87 mg/dL — ABNORMAL HIGH (ref 0.61–1.24)
GFR, Estimated: 5 mL/min — ABNORMAL LOW (ref >60)
Glucose, Bld: 226 mg/dL — ABNORMAL HIGH (ref 70–99)
Phosphorus: 5.4 mg/dL — ABNORMAL HIGH (ref 2.5–4.6)
Potassium: 4.1 mmol/L (ref 3.5–5.1)
Sodium: 129 mmol/L — ABNORMAL LOW (ref 135–145)

## 2022-11-29 LAB — PROCALCITONIN: Procalcitonin: 109.3 ng/mL

## 2022-11-29 LAB — GLUCOSE, CAPILLARY
Glucose-Capillary: 112 mg/dL — ABNORMAL HIGH (ref 70–99)
Glucose-Capillary: 149 mg/dL — ABNORMAL HIGH (ref 70–99)
Glucose-Capillary: 181 mg/dL — ABNORMAL HIGH (ref 70–99)
Glucose-Capillary: 208 mg/dL — ABNORMAL HIGH (ref 70–99)

## 2022-11-29 MED ORDER — CLONIDINE HCL 0.2 MG PO TABS
0.3000 mg | ORAL_TABLET | Freq: Two times a day (BID) | ORAL | Status: DC
  Administered 2022-11-29 (×2): 0.3 mg via ORAL
  Filled 2022-11-29 (×2): qty 1

## 2022-11-29 MED ORDER — SODIUM CHLORIDE 0.9 % IV SOLN
1.0000 g | INTRAVENOUS | Status: DC
  Administered 2022-11-29 – 2022-12-02 (×2): 1 g via INTRAVENOUS
  Filled 2022-11-29 (×3): qty 1

## 2022-11-29 NOTE — Progress Notes (Signed)
Received patient in bed to unit.  Alert and oriented.  Informed consent signed and in chart.   [place] duration:3.5hrs  Patient tolerated well.  Transported back to the room  Alert, without acute distress.  Hand-off given to patient's nurse.   Access used: AVF Access issues: none  Total UF removed: 4L Medication(s) given: None Post HD VS: 105/67,76,17,95% Post HD weight: 136.2kg   [facility]

## 2022-11-29 NOTE — Consult Note (Signed)
ORTHOPAEDIC CONSULTATION  REQUESTING PHYSICIAN: Dwyane Dee, MD  Chief Complaint: Abscess and osteomyelitis left second toe.  HPI: Kelly Key is a 52 y.o. male who presents with uncontrolled type 2 diabetes with osteomyelitis ulceration and abscess left second toe.  Patient has peripheral vascular disease with chronic kidney disease.  Past Medical History:  Diagnosis Date   Acute diastolic CHF (congestive heart failure) ([place]) 06/11/2012   EF 50-55% [facility])   Chronic kidney disease    COPD (chronic obstructive pulmonary disease) (HCC)    Diabetes mellitus    A1c 11.5 (06/11/2012).   Dyspnea    Gout    Hepatic steatosis 06/11/2012   Elevated LFTs   Hyperlipemia    Malignant hypertension    Microcytic anemia 06/12/2012   Obesity    Past Surgical History:  Procedure Laterality Date   AV FISTULA PLACEMENT Left 11/11/2019   Procedure: ARTERIOVENOUS (AV) FISTULA CREATION LEFT BRACHIOCEPHALIC ARM;  Surgeon: Serafina Mitchell, MD;  Location: [place];  Service: Vascular;  Laterality: Left;   COLONOSCOPY WITH PROPOFOL N/A 09/02/2021   Procedure: COLONOSCOPY WITH PROPOFOL;  Surgeon: Jonathon Bellows, MD;  Location: [facility] ENDOSCOPY;  Service: Gastroenterology;  Laterality: N/A;   IR FLUORO GUIDE CV LINE RIGHT  07/08/2019   IR REMOVAL TUN CV CATH W/O FL  03/23/2020   IR US GUIDE VASC ACCESS RIGHT  07/08/2019   LEFT HEART CATH AND CORONARY ANGIOGRAPHY N/A 06/12/2021   Procedure: LEFT HEART CATH AND CORONARY ANGIOGRAPHY;  Surgeon: Belva Crome, MD;  Location: [place] CV LAB;  Service: Cardiovascular;  Laterality: N/A;   NO PAST SURGERIES     VASCULAR SURGERY     Social History   Socioeconomic History   Marital status: Single    Spouse name: Not on file   Number of children: Not on file   Years of education: Not on file   Highest education level: Not on file  Occupational History   Occupation: retired  Tobacco Use   Smoking status: Never    Passive exposure: Past    Smokeless tobacco: Never  Vaping Use   Vaping Use: Never used  Substance and Sexual Activity   Alcohol use: Not Currently    Comment: haven't drank in over 5 years    Drug use: No   Sexual activity: Not Currently  Other Topics Concern   Not on file  Social History Narrative   Not on file   Social Determinants of Health   Financial Resource Strain: Low Risk  (02/20/2019)   Overall Financial Resource Strain (CARDIA)    Difficulty of Paying Living Expenses: Not hard at all  Food Insecurity: No Food Insecurity (11/26/2022)   Hunger Vital Sign    Worried About Running Out of Food in the Last Year: Never true    [place] in the Last Year: Never true  Transportation Needs: No Transportation Needs (11/26/2022)   PRAPARE - Hydrologist (Medical): No    Lack of Transportation (Non-Medical): No  Physical Activity: Unknown (02/20/2019)   Exercise Vital Sign    Days of Exercise per Week: Patient refused    Minutes of Exercise per Session: Patient refused  Stress: No Stress Concern Present (02/20/2019)   [place]    Feeling of Stress : Not at all  Social Connections: Unknown (02/20/2019)   Social Connection and Isolation Panel [NHANES]    Frequency of Communication with Friends  and Family: Patient refused    Frequency of Social Gatherings with Friends and Family: Patient refused    Attends Religious Services: Patient refused    Marine scientist or Organizations: Patient refused    Attends Music therapist: Patient refused    Marital Status: Patient refused   Family History  Problem Relation Age of Onset   Gout Mother    Asthma Mother    Diabetes Father    Heart failure Father    Diabetes Sister    Hypertension Brother    Pancreatic cancer Brother    Diabetes Sister    - negative except otherwise stated in the family history section Allergies  Allergen Reactions    Ozempic (0.25 Or 0.5 Mg-Dose) [Semaglutide(0.25 Or 0.5mg -Dos)] Shortness Of Breath   Hydralazine Anxiety    Hallucinations    Lisinopril Swelling   Prior to Admission medications   Medication Sig Start Date End Date Taking? Authorizing Provider  acetaminophen (TYLENOL) 500 MG tablet Take 1,000 mg by mouth every 6 (six) hours as needed for moderate pain or headache.    [provider]  albuterol (VENTOLIN HFA) 108 (90 Base) MCG/ACT inhaler Inhale 2 puffs into the lungs every 4 (four) hours as needed for wheezing or shortness of breath.  03/24/19   [provider]  allopurinol (ZYLOPRIM) 100 MG tablet Take 2 tablets (200 mg total) by mouth daily. 08/04/19   Love, Ivan Anchors, PA-C  AMBULATORY NON FORMULARY MEDICATION Inject 0.2 ml by intracavernosal route as directed.  Medication Name: TriMix PGE 10 mcg Pap   30 mg Phent 1 mg 01/01/22   Stoneking, Reece Leader., MD  amLODipine (NORVASC) 5 MG tablet Take 5 mg by mouth daily. 01/30/22   [provider]  ammonium lactate (AMLACTIN) 12 % cream Apply 1 Application topically in the morning and at bedtime. 09/08/22   [provider]  ascorbic acid (VITAMIN C) 500 MG tablet Take 1 tablet (500 mg total) by mouth daily. 11/06/22 12/06/22  Manuella Ghazi, Pratik D, DO  aspirin EC 81 MG EC tablet Take 1 tablet (81 mg total) by mouth daily. 08/04/19   Love, Ivan Anchors, PA-C  atorvastatin (LIPITOR) 80 MG tablet Take 1 tablet (80 mg total) by mouth daily. 12/05/19   Arnoldo Lenis, MD  Calcium Carbonate Antacid (CALCIUM CARBONATE, DOSED IN MG ELEMENTAL CALCIUM,) 1250 MG/5ML SUSP Take 5 mLs (500 mg of elemental calcium total) by mouth every 6 (six) hours as needed for indigestion. 07/16/19   Amin, Jeanella Flattery, MD  carvedilol (COREG) 25 MG tablet Take 1 tablet (25 mg total) by mouth 2 (two) times daily with a meal. 08/03/19   Love, Ivan Anchors, PA-C  cloNIDine (CATAPRES) 0.3 MG tablet Take 0.3 mg by mouth 3 (three) times daily.    [provider]  esomeprazole (NEXIUM) 20 MG capsule Take 20 mg by mouth daily. 01/23/22   [provider]  ezetimibe (ZETIA) 10 MG tablet Take 10 mg by mouth every evening. 01/20/19   [provider]  fluticasone (FLONASE) 50 MCG/ACT nasal spray Place 1 spray into both nostrils daily as needed for allergies or rhinitis.    [provider]  furosemide (LASIX) 80 MG tablet Take 1 tablet (80 mg total) by mouth daily. 05/16/21   Shahmehdi, Valeria Batman, MD  gabapentin (NEURONTIN) 100 MG capsule Take 1 capsule (100 mg total) by mouth 3 (three) times daily. 08/03/19   Love, Ivan Anchors, PA-C  glucose blood (ACCU-CHEK AVIVA PLUS)  test strip Use as instructed TO CHECK BLOOD GLUCOSE THREE TIMES DAILY 08/15/20   Nida, Marella Chimes, MD  guaiFENesin-dextromethorphan (ROBITUSSIN DM) 100-10 MG/5ML syrup Take 10 mLs by mouth every 8 (eight) hours. 11/05/22   Manuella Ghazi, Pratik D, DO  HUMALOG KWIKPEN 100 UNIT/ML KwikPen Inject 8-14 Units into the skin 3 (three) times daily. 08/19/22   [provider]  hydrOXYzine (ATARAX) 25 MG tablet Take 25 mg by mouth 3 (three) times daily as needed for anxiety. 11/28/21   [provider]  Insulin Pen Needle (B-D ULTRAFINE III SHORT PEN) 31G X 8 MM MISC 1 each by Does not apply route as directed. 08/13/20   Cassandria Anger, MD  Ipratropium-Albuterol (COMBIVENT) 20-100 MCG/ACT AERS respimat Inhale 1 puff into the lungs every 6 (six) hours as needed for wheezing or shortness of breath. 11/05/22 12/05/22  Manuella Ghazi, Pratik D, DO  LEVEMIR FLEXTOUCH 100 UNIT/ML FlexTouch Pen INJECT 50 UNITS SUBCUTANEOUSLY AT BEDTIME Patient taking differently: Inject 50 Units into the skin at bedtime. 07/09/21   Brita Romp, NP  linaclotide Rolan Lipa) 145 MCG CAPS capsule Take 1 capsule (145 mcg total) by mouth daily before breakfast. Patient not taking: Reported on 11/04/2022 04/05/18   Heath Lark D, DO  multivitamin (RENA-VIT) TABS tablet Take 1 tablet by mouth at bedtime. 08/03/19    Love, Ivan Anchors, PA-C  mupirocin ointment (BACTROBAN) 2 % Apply 1 Application topically 2 (two) times daily.    [provider]  OZEMPIC, 1 MG/DOSE, 4 MG/3ML SOPN Inject 1 mg into the skin once a week. Fridays.    [provider]  sevelamer carbonate (RENVELA) 800 MG tablet Take 3 tablets (2,400 mg total) by mouth 3 (three) times daily with meals. 08/03/19   Love, Ivan Anchors, PA-C  vardenafil (LEVITRA) 20 MG tablet Take 1 tablet (20 mg total) by mouth daily as needed for erectile dysfunction. 08/16/21   Stoneking, Reece Leader., MD  zinc sulfate 220 (50 Zn) MG capsule Take 1 capsule (220 mg total) by mouth daily. 11/06/22 12/06/22  Heath Lark D, DO   MR FOOT LEFT WO CONTRAST  Result Date: 11/28/2022 CLINICAL DATA:  Foot swelling, diabetic, osteomyelitis suspected. Concern for wet gangrene of the left 2nd toe. EXAM: MRI OF THE LEFT FOOT WITHOUT CONTRAST TECHNIQUE: Multiplanar, multisequence MR imaging of the left forefoot was performed. No intravenous contrast was administered. COMPARISON:  Previous MRI 11/06/2021. No comparison radiographs available. FINDINGS: Despite efforts by the technologist and patient, mild-to-moderate motion artifact is present on today's exam and could not be eliminated. This reduces exam sensitivity and specificity. Motion is greatest on the sagittal images which are essentially nondiagnostic. Bones/Joint/Cartilage Evaluation of the distal phalanges is limited by clawtoe deformities and the lack of diagnostic sagittal images. The distal 2nd phalanx appears abnormal with diffusely decreased T1 and increased T2 signal. In addition, there is probable cortical destruction. The proximal and middle phalanges demonstrate nonspecific low-level marrow T2 hyperintensity without T1 signal abnormality. Presumed interval resection of the distal 3rd and 4th phalanges. No acute findings are seen within the great or small toes. There are degenerative changes at the 1st interphalangeal  and metatarsophalangeal joints. No significant joint effusions. Ligaments The collateral ligaments at the metatarsophalangeal joints are intact. The Lisfranc ligament is intact. Muscles and Tendons Chronic generalized fatty atrophy throughout the forefoot musculature, similar to previous study. Mild tenosynovitis of the flexor tendon of the 2nd toe. The extensor tendons appear unremarkable. Soft tissues Nonspecific dorsal subcutaneous edema throughout the forefoot. There  is generalized soft tissue swelling in the 2nd toe which appears greatest distally. Cannot exclude a small amount of soft tissue emphysema within the distal aspect of the 2nd toe. No focal fluid collection identified. IMPRESSION: 1. Examination is limited by motion, especially on the sagittal images. Plain film correlation and correlation with prior surgical history recommended. 2. The distal 2nd phalanx appears abnormal with probable cortical destruction and marrow edema, suspicious for osteomyelitis or osteonecrosis. 3. The proximal and middle phalanges of the 2nd toe demonstrate nonspecific low-level marrow T2 hyperintensity without T1 signal abnormality which could be reactive. 4. Diffuse soft tissue swelling throughout the 2nd toe consistent with soft tissue infection. Cannot exclude a small amount of soft tissue emphysema distally which could indicate necrotizing infection (gangrene). No focal fluid collection identified. Mild flexor tenosynovitis of the 2nd toe. 5. Presumed interval resection of the distal 3rd and 4th phalanges. Electronically Signed   By: Richardean Sale M.D.   On: 11/28/2022 13:02   US ARTERIAL ABI (SCREENING LOWER EXTREMITY)  Result Date: 11/28/2022 CLINICAL DATA:  Toe gangrene EXAM: NONINVASIVE PHYSIOLOGIC VASCULAR STUDY OF BILATERAL LOWER EXTREMITIES TECHNIQUE: Evaluation of both lower extremities were performed at rest, including calculation of ankle-brachial indices with single level pressure measurements and  doppler recording. COMPARISON:  None available. FINDINGS: Right ABI:  1.12 Left ABI:  0.89 Right Lower Extremity: Multiphasic waveform in posterior tibial artery. Dorsalis pedis is monophasic. Left Lower Extremity: Posterior tibial and dorsalis pedis are monophasic. 0.8-0.89 Mild PAD IMPRESSION: Mild left lower extremity PAD. Electronically Signed   By: Miachel Roux M.D.   On: 11/28/2022 10:08   DG CHEST PORT 1 VIEW  Result Date: 11/28/2022 CLINICAL DATA:  Dyspnea and fever, COVID infection EXAM: PORTABLE CHEST 1 VIEW COMPARISON:  11/26/2022 FINDINGS: Upper normal heart size for technique. Previous cephalization of blood flow in indistinct pulmonary vasculature has improved, currently with no compelling findings of pulmonary venous hypertension or edema. Atherosclerotic calcification of the aortic arch. No blunting of the costophrenic angles. Mild thoracic spondylosis. IMPRESSION: 1. Upper normal heart size for technique. No compelling findings of pulmonary venous hypertension or edema. 2. Mild thoracic spondylosis. Electronically Signed   By: Van Clines M.D.   On: 11/28/2022 07:36   - pertinent xrays, CT, MRI studies were reviewed and independently interpreted  Positive ROS: All other systems have been reviewed and were otherwise negative with the exception of those mentioned in the HPI and as above.  Physical Exam: General: Alert, no acute distress Psychiatric: Patient is competent for consent with normal mood and affect Lymphatic: No axillary or cervical lymphadenopathy Cardiovascular: No pedal edema Respiratory: No cyanosis, no use of accessory musculature GI: No organomegaly, abdomen is soft and non-tender    Images:  @ENCIMAGES @  Labs:  Lab Results  Component Value Date   HGBA1C 7.4 (H) 10/18/2022   HGBA1C 8.0 (H) 11/13/2021   HGBA1C 8.2 (H) 05/14/2021   ESRSEDRATE 118 (H) 11/28/2022   ESRSEDRATE 68 (H) 11/13/2021   CRP 17.0 (H) 11/28/2022   CRP 3.5 (H) 11/05/2022   CRP  6.9 11/13/2021   LABURIC 7.1 07/17/2019   LABURIC 9.7 (H) 06/23/2019   LABURIC 9.9 (H) 02/20/2019   REPTSTATUS PENDING 11/27/2022   GRAMSTAIN  02/20/2019    CYTOSPIN SMEAR NO ORGANISMS SEEN WBC PRESENT, PREDOMINANTLY PMN Performed at [facility], [address], [place], [place] [address]    CULT  11/27/2022    NO GROWTH 2 DAYS Performed at [facility], [address], [place],  [place] [address]     Lab Results  Component Value Date   ALBUMIN 2.6 (L) 11/29/2022   ALBUMIN 2.9 (L) 11/28/2022   ALBUMIN 2.9 (L) 11/27/2022   PREALBUMIN 22 11/28/2022   LABURIC 7.1 07/17/2019   LABURIC 9.7 (H) 06/23/2019   LABURIC 9.9 (H) 02/20/2019        Latest Ref Rng & Units 11/28/2022   11:10 PM 11/28/2022    8:31 AM 11/27/2022   12:00 PM  CBC EXTENDED  WBC 4.0 - 10.5 K/uL 7.9  7.1  7.9   RBC 4.22 - 5.81 MIL/uL 3.48  3.08  2.87   Hemoglobin 13.0 - 17.0 g/dL 9.8  8.7  8.1   HCT 39.0 - 52.0 % 30.9  27.5  25.5   Platelets 150 - 400 K/uL 233  221  212   NEUT# 1.7 - 7.7 K/uL 4.3  3.7  5.6   Lymph# 0.7 - 4.0 K/uL 1.9  2.0  1.1     Neurologic: Patient does not have protective sensation bilateral lower extremities.   MUSCULOSKELETAL:   Skin: Examination patient has sausage digit swelling of the left second toe with ulceration and exposed bone.  Patient does not have a palpable pulse.  Review of the MRI scan shows osteomyelitis and abscess of the second toe.  Albumin 2.6 with a hemoglobin A1c of 7.4.  No ascending cellulitis.  Assessment: Assessment: Abscess and osteomyelitis left second toe with peripheral vascular disease.    Plan: I will follow-up and coordinate care with vascular surgery.  Anticipate endovascular evaluation on Monday and possible second ray amputation Wednesday.  Thank you for the consult and the opportunity to see Mr. Joslyn Hy, MD [facility] [phone] 10:59 AM

## 2022-11-29 NOTE — Progress Notes (Signed)
Progress Note    Kelly Key   ZOX:[id number]  DOB: [birth year]  DOA: 11/26/2022     2 PCP: Kelly Gravel, MD  Initial CC: Left foot infection  Hospital Course: [name] is a 52 y.o. male with PMH chronic dCHF, COPD, ESRD on HD, DMII, hepatic steatosis, HLD, and more who presented with dyspnea.  Patient reports that he has had some level of dyspnea since he was diagnosed with COVID.  He also began developing fevers at home and a mild nonproductive cough. He also has had an ongoing wound involving his left foot second digit. Exam was concerning for toe gangrene.  He was admitted for IV antibiotics and evaluation with orthopedic surgery and vascular surgery.  Interval History:  Evaluated left foot during exam and wound is noted to be purulent and foul-smelling.  Patient hoping to salvage as much of the limb as possible but understands probable need for some sort of amputation.  Assessment and Plan: * Gangrene of toe of left foot [name]) - Patient says he has been followed for this by his podiatrist Dr. Irving Key in [place] is purulent and foul-smelling -Also has old healed chronic wound involving right foot third digit -Continue antibiotics as per ID (vancomycin, ceftazidime, Flagyl) - Vascular and orthopedic surgery following - Tentative plan for angiogram on Monday followed by probable toe amputation on Wednesday depending on angiogram results  Osteomyelitis of second toe of left foot ([name]) - possibly from PAD; angiogram planned for Monday and possible toe amputation with Dr. Sharol Key on Wednesday - continue abx for now  Acute respiratory failure with hypoxia ([name]) - Likely related to COVID-19 and possibly some fluid overload - Chest x-ray Key pulmonary edema and pleural effusions of improved.  Mild residual vascular congestion.  Mild cardiomegaly.  Mild bibasilar atelectasis. - Patient has been COVID-positive since January 9 - no isolation needed per Dr.  Baxter Key  ESRD (end stage renal disease) [name]) - Nephrology arranging hemodialysis - Pt is on schedule with TTS HD treatments  Fever, unspecified-resolved as of 11/29/2022 - Blood cultures negative so far, continue following - Suspected due to setting of toe gangrene  History of COVID-19-resolved as of 11/29/2022 - Initially positive on 11/04/2022 -No indication for treatment or isolation  GERD (gastroesophageal reflux disease) - Continue Protonix  Diabetes mellitus type 2 in obese ([name]) - A1c 7.4% on 10/18/22 - continue Levemir - Continue SSI and CBG monitoring  Chronic combined systolic and diastolic CHF (congestive heart failure) ([name]) - Continue aspirin, statin, Coreg, home dose of Lasix - Not clinically in exacerbation - Volume management with hemodialysis   Hyperkalemia-resolved as of 11/29/2022 - Treated with hemodialysis and resolved now   Old records reviewed in assessment of this patient  Antimicrobials: Rocephin 11/28/2022 x 1 Ceftazidime 11/28/2022 >> current Flagyl 11/28/2022 >> current Vancomycin 11/28/2022 >> current  DVT prophylaxis:  heparin injection 5,000 Units Start: 11/27/22 0600 SCDs Start: 11/26/22 2323   Code Status:   Code Status: Full Code  Mobility Assessment (last 72 hours)     Mobility Assessment     Row Name 11/29/22 1009 11/29/22 1006 11/27/22 1100 11/26/22 2320     Does patient have an order for bedrest or is patient medically unstable No - Continue assessment No - Continue assessment No - Continue assessment No - Continue assessment    What is the highest level of mobility based on the progressive mobility assessment? Level 5 (Walks with assist in room/hall) - Balance while stepping  forward/back and can walk in room with assist - Complete Level 5 (Walks with assist in room/hall) - Balance while stepping forward/back and can walk in room with assist - Complete Level 5 (Walks with assist in room/hall) - Balance while stepping forward/back and can walk  in room with assist - Complete Level 5 (Walks with assist in room/hall) - Balance while stepping forward/back and can walk in room with assist - Complete             Barriers to discharge:  Disposition Plan: Pending clinical course, likely home Status is: Inpatient  Objective: Blood pressure 103/64, pulse 75, temperature (!) 97.5 F (36.4 C), temperature source Oral, resp. rate 16, height 5\' 6"  (1.676 m), weight (!) 136.1 kg, SpO2 96 %.  Examination:  Physical Exam Constitutional:      General: He is not in acute distress.    Appearance: Normal appearance.  HENT:     Head: Normocephalic and atraumatic.     Mouth/Throat:     Mouth: Mucous membranes are moist.  Eyes:     Extraocular Movements: Extraocular movements intact.  Cardiovascular:     Rate and Rhythm: Normal rate and regular rhythm.     Heart sounds: Normal heart sounds.  Pulmonary:     Effort: Pulmonary effort is normal. No respiratory distress.     Breath sounds: Normal breath sounds. No wheezing.  Abdominal:     General: Bowel sounds are normal. There is no distension.     Palpations: Abdomen is soft.     Tenderness: There is no abdominal tenderness.  Musculoskeletal:     Cervical back: Normal range of motion and neck supple.     Comments: Left foot 2nd digit noted to be consistent with gangrene with foul odor and purulent drainage noted; no TTP  Skin:    General: Skin is warm and dry.  Neurological:     General: No focal deficit present.     Mental Status: He is alert.  Psychiatric:        Mood and Affect: Mood normal.        Behavior: Behavior normal.      Consultants:  Infectious disease Vascular surgery Orthopedic surgery Nephrology  Procedures:    Data Reviewed: Results for orders placed or performed during the hospital encounter of 11/26/22 (from the past 24 hour(s))  Glucose, capillary     Status: Abnormal   Collection Time: 11/28/22  4:27 PM  Result Value Ref Range   Glucose-Capillary  239 (H) 70 - 99 mg/dL  Glucose, capillary     Status: Abnormal   Collection Time: 11/28/22  9:57 PM  Result Value Ref Range   Glucose-Capillary 159 (H) 70 - 99 mg/dL   Comment 1 Notify RN    Comment 2 Document in Chart   Glucose, capillary     Status: Abnormal   Collection Time: 11/28/22 11:07 PM  Result Value Ref Range   Glucose-Capillary 116 (H) 70 - 99 mg/dL   Comment 1 Notify RN   CBC with Differential/Platelet     Status: Abnormal   Collection Time: 11/28/22 11:10 PM  Result Value Ref Range   WBC 7.9 4.0 - 10.5 K/uL   RBC 3.48 (L) 4.22 - 5.81 MIL/uL   Hemoglobin 9.8 (L) 13.0 - 17.0 g/dL   HCT 30.9 (L) 39.0 - 52.0 %   MCV 88.8 80.0 - 100.0 fL   MCH 28.2 26.0 - 34.0 pg   MCHC 31.7 30.0 - 36.0 g/dL  RDW 14.8 11.5 - 15.5 %   Platelets 233 150 - 400 K/uL   nRBC 0.0 0.0 - 0.2 %   Neutrophils Relative % 53 %   Neutro Abs 4.3 1.7 - 7.7 K/uL   Lymphocytes Relative 24 %   Lymphs Abs 1.9 0.7 - 4.0 K/uL   Monocytes Relative 17 %   Monocytes Absolute 1.3 (H) 0.1 - 1.0 K/uL   Eosinophils Relative 4 %   Eosinophils Absolute 0.3 0.0 - 0.5 K/uL   Basophils Relative 1 %   Basophils Absolute 0.0 0.0 - 0.1 K/uL   Immature Granulocytes 1 %   Abs Immature Granulocytes 0.04 0.00 - 0.07 K/uL  Renal function panel     Status: Abnormal   Collection Time: 11/29/22  1:54 AM  Result Value Ref Range   Sodium 129 (L) 135 - 145 mmol/L   Potassium 4.1 3.5 - 5.1 mmol/L   Chloride 90 (L) 98 - 111 mmol/L   CO2 23 22 - 32 mmol/L   Glucose, Bld 226 (H) 70 - 99 mg/dL   BUN 70 (H) 6 - 20 mg/dL   Creatinine, Ser 10.87 (H) 0.61 - 1.24 mg/dL   Calcium 8.0 (L) 8.9 - 10.3 mg/dL   Phosphorus 5.4 (H) 2.5 - 4.6 mg/dL   Albumin 2.6 (L) 3.5 - 5.0 g/dL   GFR, Estimated 5 (L) >60 mL/min   Anion gap 16 (H) 5 - 15  Procalcitonin     Status: None   Collection Time: 11/29/22  1:54 AM  Result Value Ref Range   Procalcitonin 109.30 ng/mL  Glucose, capillary     Status: Abnormal   Collection Time: 11/29/22  8:58  AM  Result Value Ref Range   Glucose-Capillary 112 (H) 70 - 99 mg/dL  Glucose, capillary     Status: Abnormal   Collection Time: 11/29/22 11:50 AM  Result Value Ref Range   Glucose-Capillary 149 (H) 70 - 99 mg/dL   Comment 1 Notify RN     I have reviewed pertinent nursing notes, vitals, labs, and images as necessary. I have ordered labwork to follow up on as indicated.  I have reviewed the last notes from staff over past 24 hours. I have discussed patient's care plan and test results with nursing staff, CM/SW, and other staff as appropriate.  Time spent: Greater than 50% of the 55 minute visit was spent in counseling/coordination of care for the patient as laid out in the A&P.   LOS: 2 days   Dwyane Dee, MD [facility] 11/29/2022, 3:02 PM

## 2022-11-29 NOTE — Progress Notes (Signed)
Pharmacy Antibiotic Note  Kelly Key is a 52 y.o. male admitted on 11/26/2022 with  Diabetic foot infection/osteo . Patient presented with SOB, believed to be secondary to pulmonary edema as well as fevers.   Pharmacy has been consulted for Vancomycin and ceftazidime dosing.  Plan: Vancomycin 2000mg  IV loading dose, then 1000mg  after QHD Also, ceftazidime 1,000 mg IV every TTS post HD  Flagyl 500mg  po BID F/U cxs and clinical progress Monitor C/S, labs and levels as indicated, F/U ID recs, surgery plans   Height: 5\' 6"  (167.6 cm) Weight: (!) 136.1 kg (300 lb 0.7 oz) IBW/kg (Calculated) : 63.8  Temp (24hrs), Avg:98.4 F (36.9 C), Min:97.7 F (36.5 C), Max:99 F (37.2 C)  Recent Labs  Lab 11/26/22 1851 11/27/22 1200 11/28/22 0448 11/28/22 0831 11/28/22 2310 11/29/22 0154  WBC 7.3 7.9  --  7.1 7.9  --   CREATININE 9.55* 10.77* 8.47*  --   --  10.87*     Estimated Creatinine Clearance: 10.3 mL/min (A) (by C-G formula based on SCr of 10.87 mg/dL (H)).    Allergies  Allergen Reactions   Ozempic (0.25 Or 0.5 Mg-Dose) [Semaglutide(0.25 Or 0.5mg -Dos)] Shortness Of Breath   Hydralazine Anxiety    Hallucinations    Lisinopril Swelling    Antimicrobials this admission: vancomycin 2/2 >>  Ceftriaxone  2/2  Ceftazidime 2/2 >> Metronidazole 2/2>>  Microbiology results: 2/1 BCx: NGTD   Thank you for allowing pharmacy to be a part of this patient's care.   Adria Dill, PharmD PGY-2 Infectious Diseases Resident  11/29/2022 9:06 AM

## 2022-11-29 NOTE — Consult Note (Signed)
Hospital Consult    Reason for Consult: Left second toe gangrene with peripheral arterial disease MRN #:  [id number]  History of Present Illness: This is a 52 y.o. male with longstanding diabetes and end-stage renal disease who presented to an outside hospital with left second toe gangrene.  Pulses were nonpalpable in the foot therefore he underwent ABI.  Vascular surgery was called for further recommendations.  On exam this morning, Kelly Key was resting comfortably.  Stated he had fevers and chills yesterday which brought him into the hospital.  No fevers and chills this morning. There is purulence from the left second toe.  The wound has been present for quite some time.  Past Medical History:  Diagnosis Date   Acute diastolic CHF (congestive heart failure) ([place]) 06/11/2012   EF 50-55% [facility])   Chronic kidney disease    COPD (chronic obstructive pulmonary disease) (H[facility])    Diabetes mellitus    A1c 11.5 (06/11/2012).   Dyspnea    Gout    Hepatic steatosis 06/11/2012   Elevated LFTs   Hyperlipemia    Malignant hypertension    Microcytic anemia 06/12/2012   Obesity     Past Surgical History:  Procedure Laterality Date   AV FISTULA PLACEMENT Left 11/11/2019   Procedure: ARTERIOVENOUS (AV) FISTULA CREATION LEFT BRACHIOCEPHALIC ARM;  Surgeon: Serafina Mitchell, MD;  Location: [place];  Service: Vascular;  Laterality: Left;   COLONOSCOPY WITH PROPOFOL N/A 09/02/2021   Procedure: COLONOSCOPY WITH PROPOFOL;  Surgeon: Jonathon Bellows, MD;  Location: [facility] ENDOSCOPY;  Service: Gastroenterology;  Laterality: N/A;   IR FLUORO GUIDE CV LINE RIGHT  07/08/2019   IR REMOVAL TUN CV CATH W/O FL  03/23/2020   IR US GUIDE VASC ACCESS RIGHT  07/08/2019   LEFT HEART CATH AND CORONARY ANGIOGRAPHY N/A 06/12/2021   Procedure: LEFT HEART CATH AND CORONARY ANGIOGRAPHY;  Surgeon: Belva Crome, MD;  Location: [place] CV LAB;  Service: Cardiovascular;  Laterality: N/A;   NO PAST SURGERIES     VASCULAR  SURGERY      Allergies  Allergen Reactions   Ozempic (0.25 Or 0.5 Mg-Dose) [Semaglutide(0.25 Or 0.5mg -Dos)] Shortness Of Breath   Hydralazine Anxiety    Hallucinations    Lisinopril Swelling    Prior to Admission medications   Medication Sig Start Date End Date Taking? Authorizing Provider  acetaminophen (TYLENOL) 500 MG tablet Take 1,000 mg by mouth every 6 (six) hours as needed for moderate pain or headache.    [provider]  albuterol (VENTOLIN HFA) 108 (90 Base) MCG/ACT inhaler Inhale 2 puffs into the lungs every 4 (four) hours as needed for wheezing or shortness of breath.  03/24/19   [provider]  allopurinol (ZYLOPRIM) 100 MG tablet Take 2 tablets (200 mg total) by mouth daily. 08/04/19   Love, Ivan Anchors, PA-C  AMBULATORY NON FORMULARY MEDICATION Inject 0.2 ml by intracavernosal route as directed.  Medication Name: TriMix PGE 10 mcg Pap   30 mg Phent 1 mg 01/01/22   Stoneking, Reece Leader., MD  amLODipine (NORVASC) 5 MG tablet Take 5 mg by mouth daily. 01/30/22   [provider]  ammonium lactate (AMLACTIN) 12 % cream Apply 1 Application topically in the morning and at bedtime. 09/08/22   [provider]  ascorbic acid (VITAMIN C) 500 MG tablet Take 1 tablet (500 mg total) by mouth daily. 11/06/22 12/06/22  Manuella Ghazi, Pratik D, DO  aspirin EC 81 MG EC tablet Take 1 tablet (81 mg total)  by mouth daily. 08/04/19   Love, Ivan Anchors, PA-C  atorvastatin (LIPITOR) 80 MG tablet Take 1 tablet (80 mg total) by mouth daily. 12/05/19   Arnoldo Lenis, MD  Calcium Carbonate Antacid (CALCIUM CARBONATE, DOSED IN MG ELEMENTAL CALCIUM,) 1250 MG/5ML SUSP Take 5 mLs (500 mg of elemental calcium total) by mouth every 6 (six) hours as needed for indigestion. 07/16/19   Amin, Jeanella Flattery, MD  carvedilol (COREG) 25 MG tablet Take 1 tablet (25 mg total) by mouth 2 (two) times daily with a meal. 08/03/19   Love, Ivan Anchors, PA-C  cloNIDine (CATAPRES) 0.3 MG tablet Take 0.3 mg by  mouth 3 (three) times daily.    [provider]  esomeprazole (NEXIUM) 20 MG capsule Take 20 mg by mouth daily. 01/23/22   [provider]  ezetimibe (ZETIA) 10 MG tablet Take 10 mg by mouth every evening. 01/20/19   [provider]  fluticasone (FLONASE) 50 MCG/ACT nasal spray Place 1 spray into both nostrils daily as needed for allergies or rhinitis.    [provider]  furosemide (LASIX) 80 MG tablet Take 1 tablet (80 mg total) by mouth daily. 05/16/21   Shahmehdi, Valeria Batman, MD  gabapentin (NEURONTIN) 100 MG capsule Take 1 capsule (100 mg total) by mouth 3 (three) times daily. 08/03/19   Love, Ivan Anchors, PA-C  glucose blood (ACCU-CHEK AVIVA PLUS) test strip Use as instructed TO CHECK BLOOD GLUCOSE THREE TIMES DAILY 08/15/20   Nida, Marella Chimes, MD  guaiFENesin-dextromethorphan (ROBITUSSIN DM) 100-10 MG/5ML syrup Take 10 mLs by mouth every 8 (eight) hours. 11/05/22   Manuella Ghazi, Pratik D, DO  HUMALOG KWIKPEN 100 UNIT/ML KwikPen Inject 8-14 Units into the skin 3 (three) times daily. 08/19/22   [provider]  hydrOXYzine (ATARAX) 25 MG tablet Take 25 mg by mouth 3 (three) times daily as needed for anxiety. 11/28/21   [provider]  Insulin Pen Needle (B-D ULTRAFINE III SHORT PEN) 31G X 8 MM MISC 1 each by Does not apply route as directed. 08/13/20   Cassandria Anger, MD  Ipratropium-Albuterol (COMBIVENT) 20-100 MCG/ACT AERS respimat Inhale 1 puff into the lungs every 6 (six) hours as needed for wheezing or shortness of breath. 11/05/22 12/05/22  Manuella Ghazi, Pratik D, DO  LEVEMIR FLEXTOUCH 100 UNIT/ML FlexTouch Pen INJECT 50 UNITS SUBCUTANEOUSLY AT BEDTIME Patient taking differently: Inject 50 Units into the skin at bedtime. 07/09/21   Brita Romp, NP  linaclotide Rolan Lipa) 145 MCG CAPS capsule Take 1 capsule (145 mcg total) by mouth daily before breakfast. Patient not taking: Reported on 11/04/2022 04/05/18   Heath Lark D, DO  multivitamin (RENA-VIT)  TABS tablet Take 1 tablet by mouth at bedtime. 08/03/19   Love, Ivan Anchors, PA-C  mupirocin ointment (BACTROBAN) 2 % Apply 1 Application topically 2 (two) times daily.    [provider]  OZEMPIC, 1 MG/DOSE, 4 MG/3ML SOPN Inject 1 mg into the skin once a week. Fridays.    [provider]  sevelamer carbonate (RENVELA) 800 MG tablet Take 3 tablets (2,400 mg total) by mouth 3 (three) times daily with meals. 08/03/19   Love, Ivan Anchors, PA-C  vardenafil (LEVITRA) 20 MG tablet Take 1 tablet (20 mg total) by mouth daily as needed for erectile dysfunction. 08/16/21   Stoneking, Reece Leader., MD  zinc sulfate 220 (50 Zn) MG capsule Take 1 capsule (220 mg total) by mouth daily. 11/06/22 12/06/22  Heath Lark D, DO    Social History  Socioeconomic History   Marital status: Single    Spouse name: Not on file   Number of children: Not on file   Years of education: Not on file   Highest education level: Not on file  Occupational History   Occupation: retired  Tobacco Use   Smoking status: Never    Passive exposure: Past   Smokeless tobacco: Never  Vaping Use   Vaping Use: Never used  Substance and Sexual Activity   Alcohol use: Not Currently    Comment: haven't drank in over 5 years    Drug use: No   Sexual activity: Not Currently  Other Topics Concern   Not on file  Social History Narrative   Not on file   Social Determinants of Health   Financial Resource Strain: Low Risk  (02/20/2019)   Overall Financial Resource Strain (CARDIA)    Difficulty of Paying Living Expenses: Not hard at all  Food Insecurity: No Food Insecurity (11/26/2022)   Hunger Vital Sign    Worried About Running Out of Food in the Last Year: Never true    [place] in the Last Year: Never true  Transportation Needs: No Transportation Needs (11/26/2022)   PRAPARE - Hydrologist (Medical): No    Lack of Transportation (Non-Medical): No  Physical Activity: Unknown (02/20/2019)    Exercise Vital Sign    Days of Exercise per Week: Patient refused    Minutes of Exercise per Session: Patient refused  Stress: No Stress Concern Present (02/20/2019)   [place]    Feeling of Stress : Not at all  Social Connections: Unknown (02/20/2019)   Social Connection and Isolation Panel [NHANES]    Frequency of Communication with Friends and Family: Patient refused    Frequency of Social Gatherings with Friends and Family: Patient refused    Attends Religious Services: Patient refused    Active Member of Clubs or Organizations: Patient refused    Attends Archivist Meetings: Patient refused    Marital Status: Patient refused  Intimate Partner Violence: Not At Risk (11/26/2022)   Humiliation, Afraid, Rape, and Kick questionnaire    Fear of Current or Ex-Partner: No    Emotionally Abused: No    Physically Abused: No    Sexually Abused: No    Family History  Problem Relation Age of Onset   Gout Mother    Asthma Mother    Diabetes Father    Heart failure Father    Diabetes Sister    Hypertension Brother    Pancreatic cancer Brother    Diabetes Sister     ROS: Otherwise negative unless mentioned in HPI  Physical Examination  Vitals:   11/29/22 0502 11/29/22 0729  BP: 124/72 132/73  Pulse: 83 83  Resp:  16  Temp: 99 F (37.2 C) 99 F (37.2 C)  SpO2: 96% 96%   Body mass index is 48.43 kg/m.  General:  WDWN in NAD Gait: Not observed HENT: WNL, normocephalic Pulmonary: normal non-labored breathing, without Rales, rhonchi,  wheezing Cardiac: regular Abdomen: soft, NT/ND, no masses Skin: without rashes Vascular Exam/Pulses: Palpable femoral arteries Extremities: ischemic changes, with Gangrene , without cellulitis; with open wounds;  Musculoskeletal: no muscle wasting or atrophy  Neurologic: A&O X 3;  No focal weakness or paresthesias are detected; speech is fluent/normal Psychiatric:   The pt has Normal affect. Lymph:  Unremarkable  CBC    Component Value Date/Time  WBC 7.9 11/28/2022 2310   RBC 3.48 (L) 11/28/2022 2310   HGB 9.8 (L) 11/28/2022 2310   HGB 10.5 (L) 06/10/2021 0952   HCT 30.9 (L) 11/28/2022 2310   HCT 31.5 (L) 06/10/2021 0952   PLT 233 11/28/2022 2310   PLT 310 06/10/2021 0952   MCV 88.8 11/28/2022 2310   MCV 88 06/10/2021 0952   MCH 28.2 11/28/2022 2310   MCHC 31.7 11/28/2022 2310   RDW 14.8 11/28/2022 2310   RDW 14.8 06/10/2021 0952   LYMPHSABS 1.9 11/28/2022 2310   MONOABS 1.3 (H) 11/28/2022 2310   EOSABS 0.3 11/28/2022 2310   BASOSABS 0.0 11/28/2022 2310    BMET    Component Value Date/Time   NA 129 (L) 11/29/2022 0154   NA 137 06/10/2021 0955   K 4.1 11/29/2022 0154   CL 90 (L) 11/29/2022 0154   CO2 23 11/29/2022 0154   GLUCOSE 226 (H) 11/29/2022 0154   BUN 70 (H) 11/29/2022 0154   BUN 66 (H) 06/10/2021 0955   CREATININE 10.87 (H) 11/29/2022 0154   CALCIUM 8.0 (L) 11/29/2022 0154   GFRNONAA 5 (L) 11/29/2022 0154   GFRAA TEST CANCELLED PER RN 05/18/2021 1147    COAGS: Lab Results  Component Value Date   INR 1.2 11/05/2022   INR 1.0 11/04/2022   INR 1.1 07/03/2019      ASSESSMENT/PLAN: This is a 52 y.o. male with left lower extremity critical limb ischemia with tissue loss at the second toe.  This is gangrene and will require amputation.  I have reached out to Dr. Sharol Given, orthopedic surgery for his assistance. Being the patient has history of longstanding diabetes, end-stage renal disease, and on exam had nonpalpable pulses in the foot, he would benefit from left lower extremity angiogram in an effort to define and improve distal perfusion for wound healing. Jashaun realizes he is at high risk of limb loss due to his comorbidities.  He would like everything done in an effort to save his foot.  I have scheduled his angiogram for Monday afternoon.  He can eat breakfast, but should be n.p.o. after.  Please continue aspirin,  statin medication.   Cassandria Santee MD MS [facility] [phone] 11/29/2022  10:17 AM

## 2022-11-29 NOTE — Progress Notes (Addendum)
Subjective: Status post transfer from [facility] yesterday /for dialysis today.  Currently no shortness of breath or chest pain, toe discomfort, states he does not remember  anything yesterday because of his infection in his toe better after antibiotics started. Dr. Sharol Given in room to evaluate  Objective Vital signs in last 24 hours: Vitals:   11/28/22 2203 11/28/22 2302 11/29/22 0502 11/29/22 0729  BP: (!) 149/83 (!) 144/80 124/72 132/73  Pulse: 80 85 83 83  Resp:  20  16  Temp:  97.7 F (36.5 C) 99 F (37.2 C) 99 F (37.2 C)  TempSrc:  Oral Oral Oral  SpO2:  96% 96% 96%  Weight:      Height:       Weight change:   Physical Exam: General: Adult male NAD pleasant Heart: RRR no MRG Lungs: CTA bilaterally unlabored room air Abdomen: Obese, NABS, soft NTND Extremities: Bipedal edema, left second toe bandage Dialysis Access: Positive bruit LUA AVF   Outpatient HD orders:  [facility] TTS Time 4 hrs 15 min BF 450 DF 500 EDW 138.5 kg  [place] 3k/2.5 calcium Gets heparin at loading dose of 2600 units then hourly dose of 1000 units/hr Meds:   Mircera 30 mcg (last given on 11/25/22) venofer 50 mg weekly (last given on 11/25/22) Calcitriol 2.5 mcg three days a week  Problem/Plan: ESRD -HD TTS, HD today on schedule as noted by Dr. Royce Macadamia will need to challenge dry weight Left second toe gangrene= per admit, ID seeing, Dr. Due to and evaluate History of COVID-pneumonia= prior admit 01/09 through 07/2023, per admit Acute hypoxic respiratory failure secondary history of COVID as well as volume overload = currently no shortness of breath max UF on HD lower dry weight as able, probably lost weight in setting of prior COVID-pneumonia and current gangrene of toe  Hypertension/volume = currently BP stable, with HF need to decrease volume on BP meds clonidine 0.3 mg 3 times daily, amlodipine 5 mg daily, Coreg 25 mg twice = decrease meds in setting volume overload to help with UF BP control  on dialysis =  clonidine to twice daily for now follow-up BP trend daily //also with volume overload furosemide not helping with volume DC for now Anemia of ESRD-noted Mircera 30 mcg given 11/25/2022 follow-up Hgb trend, hold Venofer in setting of infection Secondary hyperparathyroidism -phosphorus calcium set will continue outpatient binder sevelamer and calcitriol on HD  Ernest Haber, PA-C [facility] [phone] 11/29/2022,9:05 AM  LOS: 2 days   Labs: Basic Metabolic Panel: Recent Labs  Lab 11/27/22 1200 11/28/22 0448 11/29/22 0154  NA 132* 129* 129*  K 4.2 4.0 4.1  CL 92* 93* 90*  CO2 24 26 23   GLUCOSE 150* 130* 226*  BUN 75* 52* 70*  CREATININE 10.77* 8.47* 10.87*  CALCIUM 8.1* 8.1* 8.0*  PHOS  --  3.8 5.4*   Liver Function Tests: Recent Labs  Lab 11/26/22 1851 11/27/22 1200 11/28/22 0448 11/29/22 0154  AST 19 24  --   --   ALT 17 21  --   --   ALKPHOS 73 60  --   --   BILITOT 0.6 0.7  --   --   PROT 7.6 7.3  --   --   ALBUMIN 2.9* 2.9* 2.9* 2.6*   No results for input(s): "LIPASE", "AMYLASE" in the last 168 hours. No results for input(s): "AMMONIA" in the last 168 hours. CBC: Recent Labs  Lab 11/26/22 1851 11/27/22 1200 11/28/22 0831 11/28/22 2310  WBC 7.3 7.9 7.1 7.9  NEUTROABS 6.3 5.6 3.7 4.3  HGB 8.8* 8.1* 8.7* 9.8*  HCT 27.6* 25.5* 27.5* 30.9*  MCV 89.9 88.9 89.3 88.8  PLT 211 212 221 233   Cardiac Enzymes: No results for input(s): "CKTOTAL", "CKMB", "CKMBINDEX", "TROPONINI" in the last 168 hours. CBG: Recent Labs  Lab 11/28/22 0751 11/28/22 1300 11/28/22 1627 11/28/22 2157 11/28/22 2307  GLUCAP 110* 147* 239* 159* 116*    Studies/Results: MR FOOT LEFT WO CONTRAST  Result Date: 11/28/2022 CLINICAL DATA:  Foot swelling, diabetic, osteomyelitis suspected. Concern for wet gangrene of the left 2nd toe. EXAM: MRI OF THE LEFT FOOT WITHOUT CONTRAST TECHNIQUE: Multiplanar, multisequence MR imaging of the left forefoot was performed. No  intravenous contrast was administered. COMPARISON:  Previous MRI 11/06/2021. No comparison radiographs available. FINDINGS: Despite efforts by the technologist and patient, mild-to-moderate motion artifact is present on today's exam and could not be eliminated. This reduces exam sensitivity and specificity. Motion is greatest on the sagittal images which are essentially nondiagnostic. Bones/Joint/Cartilage Evaluation of the distal phalanges is limited by clawtoe deformities and the lack of diagnostic sagittal images. The distal 2nd phalanx appears abnormal with diffusely decreased T1 and increased T2 signal. In addition, there is probable cortical destruction. The proximal and middle phalanges demonstrate nonspecific low-level marrow T2 hyperintensity without T1 signal abnormality. Presumed interval resection of the distal 3rd and 4th phalanges. No acute findings are seen within the great or small toes. There are degenerative changes at the 1st interphalangeal and metatarsophalangeal joints. No significant joint effusions. Ligaments The collateral ligaments at the metatarsophalangeal joints are intact. The Lisfranc ligament is intact. Muscles and Tendons Chronic generalized fatty atrophy throughout the forefoot musculature, similar to previous study. Mild tenosynovitis of the flexor tendon of the 2nd toe. The extensor tendons appear unremarkable. Soft tissues Nonspecific dorsal subcutaneous edema throughout the forefoot. There is generalized soft tissue swelling in the 2nd toe which appears greatest distally. Cannot exclude a small amount of soft tissue emphysema within the distal aspect of the 2nd toe. No focal fluid collection identified. IMPRESSION: 1. Examination is limited by motion, especially on the sagittal images. Plain film correlation and correlation with prior surgical history recommended. 2. The distal 2nd phalanx appears abnormal with probable cortical destruction and marrow edema, suspicious for  osteomyelitis or osteonecrosis. 3. The proximal and middle phalanges of the 2nd toe demonstrate nonspecific low-level marrow T2 hyperintensity without T1 signal abnormality which could be reactive. 4. Diffuse soft tissue swelling throughout the 2nd toe consistent with soft tissue infection. Cannot exclude a small amount of soft tissue emphysema distally which could indicate necrotizing infection (gangrene). No focal fluid collection identified. Mild flexor tenosynovitis of the 2nd toe. 5. Presumed interval resection of the distal 3rd and 4th phalanges. Electronically Signed   By: Richardean Sale M.D.   On: 11/28/2022 13:02   US ARTERIAL ABI (SCREENING LOWER EXTREMITY)  Result Date: 11/28/2022 CLINICAL DATA:  Toe gangrene EXAM: NONINVASIVE PHYSIOLOGIC VASCULAR STUDY OF BILATERAL LOWER EXTREMITIES TECHNIQUE: Evaluation of both lower extremities were performed at rest, including calculation of ankle-brachial indices with single level pressure measurements and doppler recording. COMPARISON:  None available. FINDINGS: Right ABI:  1.12 Left ABI:  0.89 Right Lower Extremity: Multiphasic waveform in posterior tibial artery. Dorsalis pedis is monophasic. Left Lower Extremity: Posterior tibial and dorsalis pedis are monophasic. 0.8-0.89 Mild PAD IMPRESSION: Mild left lower extremity PAD. Electronically Signed   By: Miachel Roux M.D.   On: 11/28/2022 10:08   DG  CHEST PORT 1 VIEW  Result Date: 11/28/2022 CLINICAL DATA:  Dyspnea and fever, COVID infection EXAM: PORTABLE CHEST 1 VIEW COMPARISON:  11/26/2022 FINDINGS: Upper normal heart size for technique. Previous cephalization of blood flow in indistinct pulmonary vasculature has improved, currently with no compelling findings of pulmonary venous hypertension or edema. Atherosclerotic calcification of the aortic arch. No blunting of the costophrenic angles. Mild thoracic spondylosis. IMPRESSION: 1. Upper normal heart size for technique. No compelling findings of pulmonary  venous hypertension or edema. 2. Mild thoracic spondylosis. Electronically Signed   By: Van Clines M.D.   On: 11/28/2022 07:36   Medications:  cefTAZidime (FORTAZ)  IV     vancomycin      (feeding supplement) PROSource Plus  30 mL Oral TID WC   amLODipine  5 mg Oral Daily   ascorbic acid  500 mg Oral Daily   aspirin EC  81 mg Oral Daily   atorvastatin  80 mg Oral Daily   calcitRIOL  2.5 mcg Oral Q T,Th,Sa-HD   carvedilol  25 mg Oral BID WC   Chlorhexidine Gluconate Cloth  6 each Topical Q0600   Chlorhexidine Gluconate Cloth  6 each Topical Q0600   cloNIDine  0.3 mg Oral TID   ezetimibe  10 mg Oral QPM   furosemide  80 mg Oral Daily   gabapentin  100 mg Oral TID   guaiFENesin-dextromethorphan  10 mL Oral Q8H   heparin  5,000 Units Subcutaneous Q8H   insulin aspart  0-15 Units Subcutaneous TID WC   insulin aspart  0-5 Units Subcutaneous QHS   insulin detemir  40 Units Subcutaneous QHS   Ipratropium-Albuterol  1 puff Inhalation BID   metroNIDAZOLE  500 mg Oral Q12H   multivitamin  1 tablet Oral QHS   nutrition supplement (JUVEN)  1 packet Oral BID BM   pantoprazole  40 mg Oral Daily   sevelamer carbonate  2,400 mg Oral TID WC   zinc sulfate  220 mg Oral Daily

## 2022-11-29 NOTE — Assessment & Plan Note (Signed)
-   possibly from PAD; angiogram planned for Monday and possible toe amputation with Dr. Sharol Given on Wednesday - continue abx for now

## 2022-11-30 DIAGNOSIS — N186 End stage renal disease: Secondary | ICD-10-CM | POA: Diagnosis not present

## 2022-11-30 DIAGNOSIS — M869 Osteomyelitis, unspecified: Secondary | ICD-10-CM | POA: Diagnosis not present

## 2022-11-30 LAB — MAGNESIUM: Magnesium: 1.9 mg/dL (ref 1.7–2.4)

## 2022-11-30 LAB — RENAL FUNCTION PANEL
Albumin: 2.8 g/dL — ABNORMAL LOW (ref 3.5–5.0)
Anion gap: 16 — ABNORMAL HIGH (ref 5–15)
BUN: 59 mg/dL — ABNORMAL HIGH (ref 6–20)
CO2: 25 mmol/L (ref 22–32)
Calcium: 8.4 mg/dL — ABNORMAL LOW (ref 8.9–10.3)
Chloride: 92 mmol/L — ABNORMAL LOW (ref 98–111)
Creatinine, Ser: 9.13 mg/dL — ABNORMAL HIGH (ref 0.61–1.24)
GFR, Estimated: 6 mL/min — ABNORMAL LOW (ref >60)
Glucose, Bld: 141 mg/dL — ABNORMAL HIGH (ref 70–99)
Phosphorus: 4.8 mg/dL — ABNORMAL HIGH (ref 2.5–4.6)
Potassium: 3.8 mmol/L (ref 3.5–5.1)
Sodium: 133 mmol/L — ABNORMAL LOW (ref 135–145)

## 2022-11-30 LAB — CBC WITH DIFFERENTIAL/PLATELET
Abs Immature Granulocytes: 0.02 10*3/uL (ref 0.00–0.07)
Basophils Absolute: 0.1 10*3/uL (ref 0.0–0.1)
Basophils Relative: 1 %
Eosinophils Absolute: 0.5 10*3/uL (ref 0.0–0.5)
Eosinophils Relative: 7 %
HCT: 27.6 % — ABNORMAL LOW (ref 39.0–52.0)
Hemoglobin: 8.8 g/dL — ABNORMAL LOW (ref 13.0–17.0)
Immature Granulocytes: 0 %
Lymphocytes Relative: 24 %
Lymphs Abs: 1.7 10*3/uL (ref 0.7–4.0)
MCH: 28.3 pg (ref 26.0–34.0)
MCHC: 31.9 g/dL (ref 30.0–36.0)
MCV: 88.7 fL (ref 80.0–100.0)
Monocytes Absolute: 1.4 10*3/uL — ABNORMAL HIGH (ref 0.1–1.0)
Monocytes Relative: 20 %
Neutro Abs: 3.3 10*3/uL (ref 1.7–7.7)
Neutrophils Relative %: 48 %
Platelets: 269 10*3/uL (ref 150–400)
RBC: 3.11 MIL/uL — ABNORMAL LOW (ref 4.22–5.81)
RDW: 14.6 % (ref 11.5–15.5)
WBC: 6.9 10*3/uL (ref 4.0–10.5)
nRBC: 0 % (ref 0.0–0.2)

## 2022-11-30 LAB — GLUCOSE, CAPILLARY
Glucose-Capillary: 95 mg/dL (ref 70–99)
Glucose-Capillary: 147 mg/dL — ABNORMAL HIGH (ref 70–99)
Glucose-Capillary: 142 mg/dL — ABNORMAL HIGH (ref 70–99)
Glucose-Capillary: 186 mg/dL — ABNORMAL HIGH (ref 70–99)

## 2022-11-30 MED ORDER — NEPRO/CARBSTEADY PO LIQD
237.0000 mL | Freq: Two times a day (BID) | ORAL | Status: DC
  Administered 2022-11-30 – 2022-12-02 (×4): 237 mL via ORAL

## 2022-11-30 MED ORDER — VANCOMYCIN HCL IN DEXTROSE 1-5 GM/200ML-% IV SOLN
1000.0000 mg | INTRAVENOUS | Status: DC
  Administered 2022-12-02 – 2022-12-04 (×2): 1000 mg via INTRAVENOUS
  Filled 2022-11-30 (×3): qty 200

## 2022-11-30 MED ORDER — CLONIDINE HCL 0.1 MG PO TABS
0.1000 mg | ORAL_TABLET | Freq: Two times a day (BID) | ORAL | Status: DC
  Administered 2022-11-30 – 2022-12-06 (×12): 0.1 mg via ORAL
  Filled 2022-11-30 (×12): qty 1

## 2022-11-30 MED ORDER — VANCOMYCIN HCL IN DEXTROSE 1-5 GM/200ML-% IV SOLN
1000.0000 mg | Freq: Once | INTRAVENOUS | Status: AC
  Administered 2022-11-30: 1000 mg via INTRAVENOUS
  Filled 2022-11-30: qty 200

## 2022-11-30 MED ORDER — CARVEDILOL 12.5 MG PO TABS
12.5000 mg | ORAL_TABLET | Freq: Two times a day (BID) | ORAL | Status: DC
  Administered 2022-11-30 – 2022-12-06 (×10): 12.5 mg via ORAL
  Filled 2022-11-30 (×10): qty 1

## 2022-11-30 NOTE — Progress Notes (Signed)
Progress Note    Kelly Key   OEU:[id number]  DOB: [birth year]  DOA: 11/26/2022     3 PCP: Kelly Gravel, MD  Initial CC: Left foot infection  Hospital Course: Kelly Key is a 52 y.o. male with PMH chronic dCHF, COPD, ESRD on HD, DMII, hepatic steatosis, HLD, and more who presented with dyspnea.  Patient reports that he has had some level of dyspnea since he was diagnosed with COVID.  He also began developing fevers at home and a mild nonproductive cough. He also has had an ongoing wound involving his left foot second digit. Exam was concerning for toe gangrene.  He was admitted for IV antibiotics and evaluation with orthopedic surgery and vascular surgery.  Interval History:  No events overnight.  Comfortable when seen.  Understands plan for angiogram tomorrow followed by possible surgery on Wednesday.  Assessment and Plan: * Gangrene of toe of left foot [facility]) - Patient says he has been followed for this by his podiatrist Kelly Key in [place] is purulent and foul-smelling -Also has old healed chronic wound involving right foot third digit -Continue antibiotics as per ID (vancomycin, ceftazidime, Flagyl) - Vascular and orthopedic surgery following - Tentative plan for angiogram on Monday followed by probable toe amputation on Wednesday depending on angiogram results  Osteomyelitis of second toe of left foot ([place]) - possibly from PAD; angiogram planned for Monday and possible toe amputation with Kelly Key on Wednesday - continue abx for now  Acute respiratory failure with hypoxia ([place]) - Likely related to COVID-19 and possibly some fluid overload - Chest x-ray Key pulmonary edema and pleural effusions of improved.  Mild residual vascular congestion.  Mild cardiomegaly.  Mild bibasilar atelectasis. - Patient has been COVID-positive since January 9 - no isolation needed per Kelly Key  ESRD (end stage renal disease) [facility]) - Nephrology arranging hemodialysis -  Pt is on schedule with TTS HD treatments  Fever, unspecified-resolved as of 11/29/2022 - Blood cultures negative so far, continue following - Suspected due to setting of toe gangrene  History of COVID-19-resolved as of 11/29/2022 - Initially positive on 11/04/2022 -No indication for treatment or isolation  GERD (gastroesophageal reflux disease) - Continue Protonix  Diabetes mellitus type 2 in obese ([place]) - A1c 7.4% on 10/18/22 - continue Levemir - Continue SSI and CBG monitoring  Chronic combined systolic and diastolic CHF (congestive heart failure) (HCC) - Continue aspirin, statin, Coreg, home dose of Lasix - Not clinically in exacerbation - Volume management with hemodialysis   Hyperkalemia-resolved as of 11/29/2022 - Treated with hemodialysis and resolved now   Old records reviewed in assessment of this patient  Antimicrobials: Rocephin 11/28/2022 x 1 Ceftazidime 11/28/2022 >> current Flagyl 11/28/2022 >> current Vancomycin 11/28/2022 >> current  DVT prophylaxis:  heparin injection 5,000 Units Start: 11/27/22 0600 SCDs Start: 11/26/22 2323   Code Status:   Code Status: Full Code  Mobility Assessment (last 72 hours)     Mobility Assessment     Row Name 11/29/22 2140 11/29/22 1009 11/29/22 1006       Does patient have an order for bedrest or is patient medically unstable No - Continue assessment No - Continue assessment No - Continue assessment     What is the highest level of mobility based on the progressive mobility assessment? Level 5 (Walks with assist in room/hall) - Balance while stepping forward/back and can walk in room with assist - Complete Level 5 (Walks with assist in room/hall) - Balance  while stepping forward/back and can walk in room with assist - Complete Level 5 (Walks with assist in room/hall) - Balance while stepping forward/back and can walk in room with assist - Complete              Barriers to discharge:  Disposition Plan: Pending clinical course,  likely home Status is: Inpatient  Objective: Blood pressure 131/70, pulse 79, temperature (!) 97.5 F (36.4 C), temperature source Oral, resp. rate 17, height 5\' 6"  (1.676 m), weight (!) 136.1 kg, SpO2 95 %.  Examination:  Physical Exam Constitutional:      General: He is not in acute distress.    Appearance: Normal appearance.  HENT:     Head: Normocephalic and atraumatic.     Mouth/Throat:     Mouth: Mucous membranes are moist.  Eyes:     Extraocular Movements: Extraocular movements intact.  Cardiovascular:     Rate and Rhythm: Normal rate and regular rhythm.     Heart sounds: Normal heart sounds.  Pulmonary:     Effort: Pulmonary effort is normal. No respiratory distress.     Breath sounds: Normal breath sounds. No wheezing.  Abdominal:     General: Bowel sounds are normal. There is no distension.     Palpations: Abdomen is soft.     Tenderness: There is no abdominal tenderness.  Musculoskeletal:     Cervical back: Normal range of motion and neck supple.     Comments: Left foot 2nd digit noted to be consistent with gangrene with foul odor and purulent drainage noted; no TTP  Skin:    General: Skin is warm and dry.  Neurological:     General: No focal deficit present.     Mental Status: He is alert.  Psychiatric:        Mood and Affect: Mood normal.        Behavior: Behavior normal.      Consultants:  Infectious disease Vascular surgery Orthopedic surgery Nephrology  Procedures:    Data Reviewed: Results for orders placed or performed during the hospital encounter of 11/26/22 (from the past 24 hour(s))  Glucose, capillary     Status: Abnormal   Collection Time: 11/29/22  7:11 PM  Result Value Ref Range   Glucose-Capillary 181 (H) 70 - 99 mg/dL  Glucose, capillary     Status: Abnormal   Collection Time: 11/29/22  8:10 PM  Result Value Ref Range   Glucose-Capillary 208 (H) 70 - 99 mg/dL  Renal function panel     Status: Abnormal   Collection Time: 11/30/22   2:32 AM  Result Value Ref Range   Sodium 133 (L) 135 - 145 mmol/L   Potassium 3.8 3.5 - 5.1 mmol/L   Chloride 92 (L) 98 - 111 mmol/L   CO2 25 22 - 32 mmol/L   Glucose, Bld 141 (H) 70 - 99 mg/dL   BUN 59 (H) 6 - 20 mg/dL   Creatinine, Ser 9.13 (H) 0.61 - 1.24 mg/dL   Calcium 8.4 (L) 8.9 - 10.3 mg/dL   Phosphorus 4.8 (H) 2.5 - 4.6 mg/dL   Albumin 2.8 (L) 3.5 - 5.0 g/dL   GFR, Estimated 6 (L) >60 mL/min   Anion gap 16 (H) 5 - 15  CBC with Differential/Platelet     Status: Abnormal   Collection Time: 11/30/22  2:32 AM  Result Value Ref Range   WBC 6.9 4.0 - 10.5 K/uL   RBC 3.11 (L) 4.22 - 5.81 MIL/uL   Hemoglobin  8.8 (L) 13.0 - 17.0 g/dL   HCT 27.6 (L) 39.0 - 52.0 %   MCV 88.7 80.0 - 100.0 fL   MCH 28.3 26.0 - 34.0 pg   MCHC 31.9 30.0 - 36.0 g/dL   RDW 14.6 11.5 - 15.5 %   Platelets 269 150 - 400 K/uL   nRBC 0.0 0.0 - 0.2 %   Neutrophils Relative % 48 %   Neutro Abs 3.3 1.7 - 7.7 K/uL   Lymphocytes Relative 24 %   Lymphs Abs 1.7 0.7 - 4.0 K/uL   Monocytes Relative 20 %   Monocytes Absolute 1.4 (H) 0.1 - 1.0 K/uL   Eosinophils Relative 7 %   Eosinophils Absolute 0.5 0.0 - 0.5 K/uL   Basophils Relative 1 %   Basophils Absolute 0.1 0.0 - 0.1 K/uL   Immature Granulocytes 0 %   Abs Immature Granulocytes 0.02 0.00 - 0.07 K/uL  Magnesium     Status: None   Collection Time: 11/30/22  2:32 AM  Result Value Ref Range   Magnesium 1.9 1.7 - 2.4 mg/dL  Glucose, capillary     Status: None   Collection Time: 11/30/22  8:15 AM  Result Value Ref Range   Glucose-Capillary 95 70 - 99 mg/dL  Glucose, capillary     Status: Abnormal   Collection Time: 11/30/22 11:32 AM  Result Value Ref Range   Glucose-Capillary 147 (H) 70 - 99 mg/dL    I have reviewed pertinent nursing notes, vitals, labs, and images as necessary. I have ordered labwork to follow up on as indicated.  I have reviewed the last notes from staff over past 24 hours. I have discussed patient's care plan and test results with  nursing staff, CM/SW, and other staff as appropriate.  Time spent: Greater than 50% of the 55 minute visit was spent in counseling/coordination of care for the patient as laid out in the A&P.   LOS: 3 days   Dwyane Dee, MD [facility] 11/30/2022, 1:36 PM

## 2022-11-30 NOTE — Progress Notes (Signed)
Pharmacy Antibiotic Note  Kelly Key is a 52 y.o. male admitted on 11/26/2022 with  Diabetic foot infection/osteo . Patient presented with SOB, believed to be secondary to pulmonary edema as well as fevers.   Pharmacy has been consulted for Vancomycin and ceftazidime dosing.  Vancomycin not given post-HD on 2/3- supplemental 1g dose ordered. Angiogram by VVS planned 2/5. Anticipated 2nd ray amputation 2/7 by ortho.   Plan: Vancomycin 1000 mg QHD TTS  Also, ceftazidime 1,000 mg IV every TTS post HD  Flagyl 500mg  po BID F/U cxs and clinical progress Monitor C/S, labs and levels as indicated, F/U ID recs, surgery plans   Height: 5\' 6"  (167.6 cm) Weight: (!) 136.1 kg (300 lb 0.7 oz) IBW/kg (Calculated) : 63.8  Temp (24hrs), Avg:98.1 F (36.7 C), Min:97.5 F (36.4 C), Max:98.4 F (36.9 C)  Recent Labs  Lab 11/26/22 1851 11/27/22 1200 11/28/22 0448 11/28/22 0831 11/28/22 2310 11/29/22 0154 11/30/22 0232  WBC 7.3 7.9  --  7.1 7.9  --  6.9  CREATININE 9.55* 10.77* 8.47*  --   --  10.87* 9.13*     Estimated Creatinine Clearance: 12.3 mL/min (A) (by C-G formula based on SCr of 9.13 mg/dL (H)).    Allergies  Allergen Reactions   Ozempic (0.25 Or 0.5 Mg-Dose) [Semaglutide(0.25 Or 0.5mg -Dos)] Shortness Of Breath   Hydralazine Anxiety    Hallucinations    Lisinopril Swelling    Antimicrobials this admission: vancomycin 2/2 >>  Ceftriaxone  2/2  Ceftazidime 2/2 >> Metronidazole 2/2>>  Microbiology results: 2/1 BCx: NGTD   Thank you for allowing pharmacy to be a part of this patient's care.   Adria Dill, PharmD PGY-2 Infectious Diseases Resident  11/30/2022 9:20 AM

## 2022-11-30 NOTE — Progress Notes (Signed)
Subjective: 4 L UF HD tolerated yesterday.  HD on schedule, some toe discomfort this a.m.  Objective Vital signs in last 24 hours: Vitals:   11/29/22 1745 11/29/22 1915 11/29/22 2014 11/30/22 0336  BP: 104/62 117/71 123/68 (!) 105/53  Pulse: 76 79 84 81  Resp: 17 19 17 16   Temp: 98.3 F (36.8 C) 98.4 F (36.9 C) 98.2 F (36.8 C) 98.1 F (36.7 C)  TempSrc: Oral Oral Oral Oral  SpO2: 95% 97% 97% 95%  Weight:      Height:       Weight change:   PPhysical Exam: General: Alert adult male NAD  Heart: RRR no MRG Lungs: CTA bilaterally unlabored room air Abdomen: Obese, NABS, soft NTND Extremities:  left second toe bandage, left lower extremity trace edema right none  Dialysis Access: Positive bruit LUA AVF     Outpatient HD orders:  [facility] TTS Time 4 hrs 15 min BF 450 DF 500 EDW 138.5 kg  [place] 3k/2.5 calcium Gets heparin at loading dose of 2600 units then hourly dose of 1000 units/hr Meds:   Mircera 30 mcg (last given on 11/25/22) venofer 50 mg weekly (last given on 11/25/22) Calcitriol 2.5 mcg three days a week   Problem/Plan: ESRD -HD TTS, HD on schedule, next dialysis Tuesday 2/06 Left second toe gangrene= per admit, seen by ID and on IV antibiotics I Dr. Sharol Given  and VVS  evaluation , noted angiogram Monday a.m. 2/5 Acute hypoxic respiratory failure secondary history of COVID as well as volume overload = no SOB  this am, 4 L UF yest on HD , lower dry weight as able, probably lost weight in setting of prior COVID-pneumonia and current gangrene of toe  History of COVID-pneumonia= prior admit 01/09 through 07/2023,  RX per admit Hypertension/volume =am  BP 105/53 , with HF need to decrease volume ,on admit BP meds clonidine 0.3 mg 3 times daily, amlodipine 5 mg daily, Coreg 25 mg twice = decreasing  meds in setting volume overload to help with UF BP control  on dialysis  Anemia of ESRD-Hgb 8.8 ,noted Mircera 30 mcg given 11/25/2022 follow-up Hgb trend, hold Venofer in  setting of infection Secondary hyperparathyroidism -phosphorus calcium stable/ continue outpatient binder sevelamer and calcitriol on HD Nutrition= albumin 2.8 , start protein supplement  Ernest Haber, PA-C [facility] [phone] 11/30/2022,8:37 AM  LOS: 3 days   Labs: Basic Metabolic Panel: Recent Labs  Lab 11/28/22 0448 11/29/22 0154 11/30/22 0232  NA 129* 129* 133*  K 4.0 4.1 3.8  CL 93* 90* 92*  CO2 26 23 25   GLUCOSE 130* 226* 141*  BUN 52* 70* 59*  CREATININE 8.47* 10.87* 9.13*  CALCIUM 8.1* 8.0* 8.4*  PHOS 3.8 5.4* 4.8*   Liver Function Tests: Recent Labs  Lab 11/26/22 1851 11/27/22 1200 11/28/22 0448 11/29/22 0154 11/30/22 0232  AST 19 24  --   --   --   ALT 17 21  --   --   --   ALKPHOS 73 60  --   --   --   BILITOT 0.6 0.7  --   --   --   PROT 7.6 7.3  --   --   --   ALBUMIN 2.9* 2.9* 2.9* 2.6* 2.8*   No results for input(s): "LIPASE", "AMYLASE" in the last 168 hours. No results for input(s): "AMMONIA" in the last 168 hours. CBC: Recent Labs  Lab 11/26/22 1851 11/27/22 1200 11/28/22 0831 11/28/22 2310 11/30/22  0232  WBC 7.3 7.9 7.1 7.9 6.9  NEUTROABS 6.3 5.6 3.7 4.3 3.3  HGB 8.8* 8.1* 8.7* 9.8* 8.8*  HCT 27.6* 25.5* 27.5* 30.9* 27.6*  MCV 89.9 88.9 89.3 88.8 88.7  PLT 211 212 221 233 269   Cardiac Enzymes: No results for input(s): "CKTOTAL", "CKMB", "CKMBINDEX", "TROPONINI" in the last 168 hours. CBG: Recent Labs  Lab 11/29/22 0858 11/29/22 1150 11/29/22 1911 11/29/22 2010 11/30/22 0815  GLUCAP 112* 149* 181* 208* 95    Studies/Results: MR FOOT LEFT WO CONTRAST  Result Date: 11/28/2022 CLINICAL DATA:  Foot swelling, diabetic, osteomyelitis suspected. Concern for wet gangrene of the left 2nd toe. EXAM: MRI OF THE LEFT FOOT WITHOUT CONTRAST TECHNIQUE: Multiplanar, multisequence MR imaging of the left forefoot was performed. No intravenous contrast was administered. COMPARISON:  Previous MRI 11/06/2021. No comparison  radiographs available. FINDINGS: Despite efforts by the technologist and patient, mild-to-moderate motion artifact is present on today's exam and could not be eliminated. This reduces exam sensitivity and specificity. Motion is greatest on the sagittal images which are essentially nondiagnostic. Bones/Joint/Cartilage Evaluation of the distal phalanges is limited by clawtoe deformities and the lack of diagnostic sagittal images. The distal 2nd phalanx appears abnormal with diffusely decreased T1 and increased T2 signal. In addition, there is probable cortical destruction. The proximal and middle phalanges demonstrate nonspecific low-level marrow T2 hyperintensity without T1 signal abnormality. Presumed interval resection of the distal 3rd and 4th phalanges. No acute findings are seen within the great or small toes. There are degenerative changes at the 1st interphalangeal and metatarsophalangeal joints. No significant joint effusions. Ligaments The collateral ligaments at the metatarsophalangeal joints are intact. The Lisfranc ligament is intact. Muscles and Tendons Chronic generalized fatty atrophy throughout the forefoot musculature, similar to previous study. Mild tenosynovitis of the flexor tendon of the 2nd toe. The extensor tendons appear unremarkable. Soft tissues Nonspecific dorsal subcutaneous edema throughout the forefoot. There is generalized soft tissue swelling in the 2nd toe which appears greatest distally. Cannot exclude a small amount of soft tissue emphysema within the distal aspect of the 2nd toe. No focal fluid collection identified. IMPRESSION: 1. Examination is limited by motion, especially on the sagittal images. Plain film correlation and correlation with prior surgical history recommended. 2. The distal 2nd phalanx appears abnormal with probable cortical destruction and marrow edema, suspicious for osteomyelitis or osteonecrosis. 3. The proximal and middle phalanges of the 2nd toe demonstrate  nonspecific low-level marrow T2 hyperintensity without T1 signal abnormality which could be reactive. 4. Diffuse soft tissue swelling throughout the 2nd toe consistent with soft tissue infection. Cannot exclude a small amount of soft tissue emphysema distally which could indicate necrotizing infection (gangrene). No focal fluid collection identified. Mild flexor tenosynovitis of the 2nd toe. 5. Presumed interval resection of the distal 3rd and 4th phalanges. Electronically Signed   By: Richardean Sale M.D.   On: 11/28/2022 13:02   US ARTERIAL ABI (SCREENING LOWER EXTREMITY)  Result Date: 11/28/2022 CLINICAL DATA:  Toe gangrene EXAM: NONINVASIVE PHYSIOLOGIC VASCULAR STUDY OF BILATERAL LOWER EXTREMITIES TECHNIQUE: Evaluation of both lower extremities were performed at rest, including calculation of ankle-brachial indices with single level pressure measurements and doppler recording. COMPARISON:  None available. FINDINGS: Right ABI:  1.12 Left ABI:  0.89 Right Lower Extremity: Multiphasic waveform in posterior tibial artery. Dorsalis pedis is monophasic. Left Lower Extremity: Posterior tibial and dorsalis pedis are monophasic. 0.8-0.89 Mild PAD IMPRESSION: Mild left lower extremity PAD. Electronically Signed   By: Danie Binder.D.  On: 11/28/2022 10:08   Medications:  cefTAZidime (FORTAZ)  IV 1 g (11/29/22 1907)   vancomycin      (feeding supplement) PROSource Plus  30 mL Oral TID WC   amLODipine  5 mg Oral Daily   ascorbic acid  500 mg Oral Daily   aspirin EC  81 mg Oral Daily   atorvastatin  80 mg Oral Daily   calcitRIOL  2.5 mcg Oral Q T,Th,Sa-HD   carvedilol  25 mg Oral BID WC   Chlorhexidine Gluconate Cloth  6 each Topical Q0600   Chlorhexidine Gluconate Cloth  6 each Topical Q0600   cloNIDine  0.3 mg Oral BID   ezetimibe  10 mg Oral QPM   gabapentin  100 mg Oral TID   guaiFENesin-dextromethorphan  10 mL Oral Q8H   heparin  5,000 Units Subcutaneous Q8H   insulin aspart  0-15 Units  Subcutaneous TID WC   insulin aspart  0-5 Units Subcutaneous QHS   insulin detemir  40 Units Subcutaneous QHS   Ipratropium-Albuterol  1 puff Inhalation BID   metroNIDAZOLE  500 mg Oral Q12H   multivitamin  1 tablet Oral QHS   nutrition supplement (JUVEN)  1 packet Oral BID BM   pantoprazole  40 mg Oral Daily   sevelamer carbonate  2,400 mg Oral TID WC   zinc sulfate  220 mg Oral Daily

## 2022-11-30 NOTE — Plan of Care (Signed)
  Problem: Education: Goal: Knowledge of risk factors and measures for prevention of condition will improve Outcome: Progressing   Problem: Coping: Goal: Psychosocial and spiritual needs will be supported Outcome: Progressing   Problem: Clinical Measurements: Goal: Ability to maintain clinical measurements within normal limits will improve Outcome: Progressing Goal: Respiratory complications will improve Outcome: Progressing

## 2022-11-30 NOTE — Progress Notes (Signed)
  Daily Progress Note  Subjective: No complaints  Objective: Vitals:   11/30/22 0900 11/30/22 1013  BP:  131/70  Pulse:  79  Resp:  17  Temp:  (!) 97.5 F (36.4 C)  SpO2: 94% 95%    Physical Examination General:  WDWN in NAD Gait: Not observed HENT: WNL, normocephalic Pulmonary: normal non-labored breathing, without Rales, rhonchi,  wheezing Cardiac: regular Abdomen: soft, NT/ND, no masses Skin: without rashes Vascular Exam/Pulses: Palpable femoral arteries Extremities: ischemic changes, with Gangrene , without cellulitis; with open wounds;  Musculoskeletal: no muscle wasting or atrophy       Neurologic: A&O X 3;  No focal weakness or paresthesias are detected; speech is fluent/normal Psychiatric:  The pt has Normal affect. Lymph:  Unremarkable  ASSESSMENT/PLAN:  This is a 52 y.o. male with left lower extremity critical limb ischemia with tissue loss at the second toe.  This is gangrene and will require amputation.  I have reached out to Dr. Sharol Given, orthopedic surgery for his assistance.  Being the patient has history of longstanding diabetes, end-stage renal disease, and on exam had nonpalpable pulses in the foot, he would benefit from left lower extremity angiogram in an effort to define and improve distal perfusion for wound healing.  Elieser realizes he is at high risk of limb loss due to his comorbidities.  He would like everything done in an effort to save his foot.   I have scheduled his angiogram for Monday. NPO midnight.   Please continue aspirin, statin medication.   Cassandria Santee MD MS [facility] [phone] 11/30/2022  1:04 PM

## 2022-12-01 ENCOUNTER — Encounter (HOSPITAL_COMMUNITY)
Admission: EM | Disposition: A | Discharge status: Home Health | Payer: Self-pay | Source: Home / Self Care | Attending: Internal Medicine

## 2022-12-01 ENCOUNTER — Encounter (HOSPITAL_COMMUNITY): Payer: Self-pay | Admitting: Vascular Surgery

## 2022-12-01 DIAGNOSIS — I70262 Atherosclerosis of native arteries of extremities with gangrene, left leg: Secondary | ICD-10-CM

## 2022-12-01 DIAGNOSIS — I739 Peripheral vascular disease, unspecified: Secondary | ICD-10-CM

## 2022-12-01 DIAGNOSIS — M869 Osteomyelitis, unspecified: Secondary | ICD-10-CM | POA: Diagnosis not present

## 2022-12-01 DIAGNOSIS — N186 End stage renal disease: Secondary | ICD-10-CM | POA: Diagnosis not present

## 2022-12-01 HISTORY — PX: ABDOMINAL AORTOGRAM W/LOWER EXTREMITY: CATH118223

## 2022-12-01 HISTORY — PX: PERIPHERAL VASCULAR INTERVENTION: CATH118257

## 2022-12-01 HISTORY — PX: PERIPHERAL VASCULAR BALLOON ANGIOPLASTY: CATH118281

## 2022-12-01 LAB — CBC WITH DIFFERENTIAL/PLATELET
Abs Immature Granulocytes: 0.03 10*3/uL (ref 0.00–0.07)
Basophils Absolute: 0 10*3/uL (ref 0.0–0.1)
Basophils Relative: 1 %
Eosinophils Absolute: 0.6 10*3/uL — ABNORMAL HIGH (ref 0.0–0.5)
Eosinophils Relative: 7 %
HCT: 29.5 % — ABNORMAL LOW (ref 39.0–52.0)
Hemoglobin: 9.7 g/dL — ABNORMAL LOW (ref 13.0–17.0)
Immature Granulocytes: 0 %
Lymphocytes Relative: 27 %
Lymphs Abs: 2.2 10*3/uL (ref 0.7–4.0)
MCH: 28.4 pg (ref 26.0–34.0)
MCHC: 32.9 g/dL (ref 30.0–36.0)
MCV: 86.5 fL (ref 80.0–100.0)
Monocytes Absolute: 1 10*3/uL (ref 0.1–1.0)
Monocytes Relative: 12 %
Neutro Abs: 4.4 10*3/uL (ref 1.7–7.7)
Neutrophils Relative %: 53 %
Platelets: 323 10*3/uL (ref 150–400)
RBC: 3.41 MIL/uL — ABNORMAL LOW (ref 4.22–5.81)
RDW: 14.5 % (ref 11.5–15.5)
WBC: 8.2 10*3/uL (ref 4.0–10.5)
nRBC: 0 % (ref 0.0–0.2)

## 2022-12-01 LAB — RENAL FUNCTION PANEL
Albumin: 2.8 g/dL — ABNORMAL LOW (ref 3.5–5.0)
Anion gap: 15 (ref 5–15)
BUN: 94 mg/dL — ABNORMAL HIGH (ref 6–20)
CO2: 26 mmol/L (ref 22–32)
Calcium: 8.7 mg/dL — ABNORMAL LOW (ref 8.9–10.3)
Chloride: 91 mmol/L — ABNORMAL LOW (ref 98–111)
Creatinine, Ser: 10.84 mg/dL — ABNORMAL HIGH (ref 0.61–1.24)
GFR, Estimated: 5 mL/min — ABNORMAL LOW (ref >60)
Glucose, Bld: 164 mg/dL — ABNORMAL HIGH (ref 70–99)
Phosphorus: 5.5 mg/dL — ABNORMAL HIGH (ref 2.5–4.6)
Potassium: 4.1 mmol/L (ref 3.5–5.1)
Sodium: 132 mmol/L — ABNORMAL LOW (ref 135–145)

## 2022-12-01 LAB — GLUCOSE, CAPILLARY
Glucose-Capillary: 131 mg/dL — ABNORMAL HIGH (ref 70–99)
Glucose-Capillary: 248 mg/dL — ABNORMAL HIGH (ref 70–99)
Glucose-Capillary: 200 mg/dL — ABNORMAL HIGH (ref 70–99)
Glucose-Capillary: 166 mg/dL — ABNORMAL HIGH (ref 70–99)

## 2022-12-01 LAB — MAGNESIUM: Magnesium: 2.2 mg/dL (ref 1.7–2.4)

## 2022-12-01 SURGERY — ABDOMINAL AORTOGRAM W/LOWER EXTREMITY
Anesthesia: LOCAL

## 2022-12-01 MED ORDER — HEPARIN SODIUM (PORCINE) 1000 UNIT/ML IJ SOLN
INTRAMUSCULAR | Status: DC | PRN
  Administered 2022-12-01: 13000 [IU] via INTRAVENOUS

## 2022-12-01 MED ORDER — CLOPIDOGREL BISULFATE 300 MG PO TABS
ORAL_TABLET | ORAL | Status: DC | PRN
  Administered 2022-12-01: 300 mg via ORAL

## 2022-12-01 MED ORDER — HEPARIN (PORCINE) IN NACL 1000-0.9 UT/500ML-% IV SOLN
INTRAVENOUS | Status: AC
  Filled 2022-12-01: qty 1000

## 2022-12-01 MED ORDER — IODIXANOL 320 MG/ML IV SOLN
INTRAVENOUS | Status: DC | PRN
  Administered 2022-12-01: 180 mL via INTRA_ARTERIAL

## 2022-12-01 MED ORDER — MIDAZOLAM HCL 5 MG/5ML IJ SOLN
INTRAMUSCULAR | Status: DC | PRN
  Administered 2022-12-01 (×2): 1 mg via INTRAVENOUS

## 2022-12-01 MED ORDER — SODIUM CHLORIDE 0.9% FLUSH
3.0000 mL | Freq: Two times a day (BID) | INTRAVENOUS | Status: DC
  Administered 2022-12-01 – 2022-12-06 (×9): 3 mL via INTRAVENOUS

## 2022-12-01 MED ORDER — SODIUM CHLORIDE 0.9 % IV SOLN: 250.0000 mL | INTRAVENOUS | Status: DC | PRN

## 2022-12-01 MED ORDER — FENTANYL CITRATE (PF) 100 MCG/2ML IJ SOLN
INTRAMUSCULAR | Status: AC
  Filled 2022-12-01: qty 2

## 2022-12-01 MED ORDER — FENTANYL CITRATE (PF) 100 MCG/2ML IJ SOLN
INTRAMUSCULAR | Status: DC | PRN
  Administered 2022-12-01 (×2): 25 ug via INTRAVENOUS

## 2022-12-01 MED ORDER — HEPARIN (PORCINE) IN NACL 1000-0.9 UT/500ML-% IV SOLN
INTRAVENOUS | Status: DC | PRN
  Administered 2022-12-01: 1000 mL

## 2022-12-01 MED ORDER — LIDOCAINE HCL (PF) 1 % IJ SOLN
INTRAMUSCULAR | Status: AC
  Filled 2022-12-01: qty 30

## 2022-12-01 MED ORDER — LABETALOL HCL 5 MG/ML IV SOLN
10.0000 mg | INTRAVENOUS | Status: DC | PRN
  Filled 2022-12-01: qty 4

## 2022-12-01 MED ORDER — IPRATROPIUM-ALBUTEROL 0.5-2.5 (3) MG/3ML IN SOLN
3.0000 mL | Freq: Three times a day (TID) | RESPIRATORY_TRACT | Status: DC
  Administered 2022-12-02 – 2022-12-03 (×3): 3 mL via RESPIRATORY_TRACT
  Filled 2022-12-01 (×4): qty 3

## 2022-12-01 MED ORDER — HYDRALAZINE HCL 20 MG/ML IJ SOLN
5.0000 mg | INTRAMUSCULAR | Status: AC | PRN
  Administered 2022-12-05 (×2): 5 mg via INTRAVENOUS
  Filled 2022-12-01 (×2): qty 1

## 2022-12-01 MED ORDER — ONDANSETRON HCL 4 MG/2ML IJ SOLN
4.0000 mg | Freq: Four times a day (QID) | INTRAMUSCULAR | Status: DC | PRN
  Administered 2022-12-01 – 2022-12-05 (×2): 4 mg via INTRAVENOUS

## 2022-12-01 MED ORDER — CLOPIDOGREL BISULFATE 75 MG PO TABS
300.0000 mg | ORAL_TABLET | Freq: Once | ORAL | Status: DC
  Filled 2022-12-01: qty 4

## 2022-12-01 MED ORDER — HEPARIN SODIUM (PORCINE) 1000 UNIT/ML IJ SOLN
INTRAMUSCULAR | Status: AC
  Filled 2022-12-01: qty 20

## 2022-12-01 MED ORDER — MIDAZOLAM HCL 5 MG/5ML IJ SOLN
INTRAMUSCULAR | Status: AC
  Filled 2022-12-01: qty 5

## 2022-12-01 MED ORDER — LIDOCAINE HCL (PF) 1 % IJ SOLN
INTRAMUSCULAR | Status: DC | PRN
  Administered 2022-12-01: 10 mL via INTRADERMAL

## 2022-12-01 MED ORDER — CLOPIDOGREL BISULFATE 75 MG PO TABS
75.0000 mg | ORAL_TABLET | Freq: Every day | ORAL | Status: DC
  Administered 2022-12-02 – 2022-12-06 (×5): 75 mg via ORAL
  Filled 2022-12-01 (×5): qty 1

## 2022-12-01 MED ORDER — ONDANSETRON HCL 4 MG/2ML IJ SOLN
INTRAMUSCULAR | Status: AC
  Filled 2022-12-01: qty 2

## 2022-12-01 MED ORDER — OXYCODONE HCL 5 MG PO TABS
ORAL_TABLET | ORAL | Status: AC
  Filled 2022-12-01: qty 1

## 2022-12-01 MED ORDER — SODIUM CHLORIDE 0.9% FLUSH: 3.0000 mL | INTRAVENOUS | Status: DC | PRN

## 2022-12-01 MED ORDER — CLOPIDOGREL BISULFATE 300 MG PO TABS
ORAL_TABLET | ORAL | Status: AC
  Filled 2022-12-01: qty 1

## 2022-12-01 MED ORDER — CLOPIDOGREL BISULFATE 75 MG PO TABS: 75.0000 mg | ORAL_TABLET | Freq: Every day | ORAL | Status: DC

## 2022-12-01 MED ORDER — ACETAMINOPHEN 325 MG PO TABS: 650.0000 mg | ORAL_TABLET | ORAL | Status: DC | PRN

## 2022-12-01 SURGICAL SUPPLY — 26 items
BALLN MUSTANG 6X120X135 (BALLOONS) ×2
BALLN STERLING OTW 3X220X150 (BALLOONS) ×2
BALLOON MUSTANG 6X120X135 (BALLOONS) IMPLANT
BALLOON STERLING OTW 3X220X150 (BALLOONS) IMPLANT
CATH ANGIO 5F PIGTAIL 65CM (CATHETERS) IMPLANT
CATH STRAIGHT 5FR 65CM (CATHETERS) IMPLANT
CATH SYNTRAX .018X150 (CATHETERS) IMPLANT
CATH SYNTRAX .035X135 (CATHETERS) IMPLANT
CATH TEMPO 5F RIM 65CM (CATHETERS) IMPLANT
DEVICE CLOSURE MYNXGRIP 6/7F (Vascular Products) IMPLANT
GLIDEWIRE ADV .035X260CM (WIRE) IMPLANT
KIT ENCORE 26 ADVANTAGE (KITS) IMPLANT
KIT MICROPUNCTURE NIT STIFF (SHEATH) IMPLANT
KIT PV (KITS) ×2 IMPLANT
SHEATH CATAPULT 7FR 45 (SHEATH) IMPLANT
SHEATH PINNACLE 5F 10CM (SHEATH) IMPLANT
SHEATH PINNACLE 7F 10CM (SHEATH) IMPLANT
SHEATH PROBE COVER 6X72 (BAG) IMPLANT
STENT ELUVIA 7X120X130 (Permanent Stent) IMPLANT
STOPCOCK MORSE 400PSI 3WAY (MISCELLANEOUS) IMPLANT
SYR MEDRAD MARK 7 150ML (SYRINGE) ×2 IMPLANT
TRANSDUCER W/STOPCOCK (MISCELLANEOUS) ×2 IMPLANT
TRAY PV CATH (CUSTOM PROCEDURE TRAY) ×2 IMPLANT
TUBING CIL FLEX 10 FLL-RA (TUBING) IMPLANT
WIRE BENTSON .035X145CM (WIRE) IMPLANT
WIRE G V18X300CM (WIRE) IMPLANT

## 2022-12-01 NOTE — Progress Notes (Signed)
Became nauseated, threw up moderate amount of liquid emesis. Medicated w/Zofran

## 2022-12-01 NOTE — Progress Notes (Signed)
Progress Note    Kelly Key   NLZ:[id number]  DOB: 03-14-1971  DOA: 11/26/2022     4 PCP: [name], MD  Initial CC: Left foot infection  Hospital Course: Kelly Key is a 52 y.o. male with PMH chronic dCHF, COPD, ESRD on HD, DMII, hepatic steatosis, HLD, and more who presented with dyspnea.  Patient reports that he has had some level of dyspnea since he was diagnosed with COVID.  He also began developing fevers at home and a mild nonproductive cough. He also has had an ongoing wound involving his left foot second digit. Exam was concerning for toe gangrene.  He was admitted for IV antibiotics and evaluation with orthopedic surgery and vascular surgery.  Interval History:  No events overnight.  Angiogram today with stent placed to left SFA.  Resting comfortably in room when seen.  Assessment and Plan: * Gangrene of toe of left foot [facility]) - Patient says he has been followed for this by his podiatrist Dr. Irving Shows in [place] is purulent and foul-smelling -Also has old healed chronic wound involving right foot third digit -Continue antibiotics as per ID (vancomycin, ceftazidime, Flagyl) - Vascular and orthopedic surgery following -Underwent angiogram today with angioplasty of left anterior tibial artery and angioplasty plus stent of left SFA - Follow-up orthopedic plans for amputation  Osteomyelitis of second toe of left foot ([place]) - Suspected due to underlying PAD - continue abx for now -Follow-up orthopedic plans for amputation  PAD (peripheral artery disease) ([place]) -Underwent angiogram 12/01/22 with angioplasty of left anterior tibial artery and angioplasty plus stent of left SFA - continue asa and statin  ESRD (end stage renal disease) [facility]) - Nephrology arranging hemodialysis - Pt is on schedule with TTS HD treatments  Fever, unspecified-resolved as of 11/29/2022 - Blood cultures negative so far, continue following - Suspected due to setting of toe  gangrene  Acute respiratory failure with hypoxia (HCC)-resolved as of 12/01/2022 - Likely related to COVID-19 and possibly some fluid overload - Chest x-ray shows pulmonary edema and pleural effusions of improved.  Mild residual vascular congestion.  Mild cardiomegaly.  Mild bibasilar atelectasis. - Patient has been COVID-positive since January 9 - no isolation needed per Dr. Baxter Flattery -Remains on room air  History of COVID-19-resolved as of 11/29/2022 - Initially positive on 11/04/2022 -No indication for treatment or isolation  GERD (gastroesophageal reflux disease) - Continue Protonix  Diabetes mellitus type 2 in obese ([place]) - A1c 7.4% on 10/18/22 - continue Levemir - Continue SSI and CBG monitoring  Chronic combined systolic and diastolic CHF (congestive heart failure) (HCC) - Continue aspirin, statin, Coreg, home dose of Lasix - Not clinically in exacerbation - Volume management with hemodialysis   Hyperkalemia-resolved as of 11/29/2022 - Treated with hemodialysis and resolved now   Old records reviewed in assessment of this patient  Antimicrobials: Rocephin 11/28/2022 x 1 Ceftazidime 11/28/2022 >> current Flagyl 11/28/2022 >> current Vancomycin 11/28/2022 >> current  DVT prophylaxis:  heparin injection 5,000 Units Start: 11/27/22 0600 SCDs Start: 11/26/22 2323   Code Status:   Code Status: Full Code  Mobility Assessment (last 72 hours)     Mobility Assessment     Row Name 11/30/22 2030 11/30/22 0900 11/29/22 2140 11/29/22 1009 11/29/22 1006   Does patient have an order for bedrest or is patient medically unstable No - Continue assessment No - Continue assessment No - Continue assessment No - Continue assessment No - Continue assessment   What is the  highest level of mobility based on the progressive mobility assessment? Level 6 (Walks independently in room and hall) - Balance while walking in room without assist - Complete Level 6 (Walks independently in room and hall) - Balance  while walking in room without assist - Complete Level 5 (Walks with assist in room/hall) - Balance while stepping forward/back and can walk in room with assist - Complete Level 5 (Walks with assist in room/hall) - Balance while stepping forward/back and can walk in room with assist - Complete Level 5 (Walks with assist in room/hall) - Balance while stepping forward/back and can walk in room with assist - Complete            Barriers to discharge:  Disposition Plan: Pending clinical course, likely home Status is: Inpatient  Objective: Blood pressure (!) 142/75, pulse 78, temperature 98.5 F (36.9 C), temperature source Oral, resp. rate (!) 9, height 5\' 6"  (1.676 m), weight (!) 136.1 kg, SpO2 96 %.  Examination:  Physical Exam Constitutional:      General: He is not in acute distress.    Appearance: Normal appearance.  HENT:     Head: Normocephalic and atraumatic.     Mouth/Throat:     Mouth: Mucous membranes are moist.  Eyes:     Extraocular Movements: Extraocular movements intact.  Cardiovascular:     Rate and Rhythm: Normal rate and regular rhythm.     Heart sounds: Normal heart sounds.  Pulmonary:     Effort: Pulmonary effort is normal. No respiratory distress.     Breath sounds: Normal breath sounds. No wheezing.  Abdominal:     General: Bowel sounds are normal. There is no distension.     Palpations: Abdomen is soft.     Tenderness: There is no abdominal tenderness.  Musculoskeletal:     Cervical back: Normal range of motion and neck supple.     Comments: Left foot 2nd digit noted to be consistent with gangrene with foul odor and purulent drainage noted; no TTP  Skin:    General: Skin is warm and dry.  Neurological:     General: No focal deficit present.     Mental Status: He is alert.  Psychiatric:        Mood and Affect: Mood normal.        Behavior: Behavior normal.      Consultants:  Infectious disease Vascular surgery Orthopedic  surgery Nephrology  Procedures:  12/01/22:  Procedure Performed: 1.  Ultrasound-guided access right common femoral artery 2.  Aortogram with catheter selection of aorta 3.  Left lower extremity arteriogram with selection of third order branches 4.  Left anterior tibial artery angioplasty (3 mm x 220 mm Sterling) 5.  Angioplasty and stent of left SFA (7 mm x 120 mm drug-coated Eluvia postdilated with a 6 mm Mustang) 6.  Mynx closure of the right common femoral artery 7.  67 minutes of monitored moderate conscious sedation time  Data Reviewed: Results for orders placed or performed during the hospital encounter of 11/26/22 (from the past 24 hour(s))  Glucose, capillary     Status: Abnormal   Collection Time: 11/30/22 11:32 AM  Result Value Ref Range   Glucose-Capillary 147 (H) 70 - 99 mg/dL  Glucose, capillary     Status: Abnormal   Collection Time: 11/30/22  4:38 PM  Result Value Ref Range   Glucose-Capillary 142 (H) 70 - 99 mg/dL  Glucose, capillary     Status: Abnormal   Collection Time: 11/30/22  7:43 PM  Result Value Ref Range   Glucose-Capillary 186 (H) 70 - 99 mg/dL  Renal function panel     Status: Abnormal   Collection Time: 12/01/22  2:21 AM  Result Value Ref Range   Sodium 132 (L) 135 - 145 mmol/L   Potassium 4.1 3.5 - 5.1 mmol/L   Chloride 91 (L) 98 - 111 mmol/L   CO2 26 22 - 32 mmol/L   Glucose, Bld 164 (H) 70 - 99 mg/dL   BUN 94 (H) 6 - 20 mg/dL   Creatinine, Ser 10.84 (H) 0.61 - 1.24 mg/dL   Calcium 8.7 (L) 8.9 - 10.3 mg/dL   Phosphorus 5.5 (H) 2.5 - 4.6 mg/dL   Albumin 2.8 (L) 3.5 - 5.0 g/dL   GFR, Estimated 5 (L) >60 mL/min   Anion gap 15 5 - 15  CBC with Differential/Platelet     Status: Abnormal   Collection Time: 12/01/22  2:21 AM  Result Value Ref Range   WBC 8.2 4.0 - 10.5 K/uL   RBC 3.41 (L) 4.22 - 5.81 MIL/uL   Hemoglobin 9.7 (L) 13.0 - 17.0 g/dL   HCT 29.5 (L) 39.0 - 52.0 %   MCV 86.5 80.0 - 100.0 fL   MCH 28.4 26.0 - 34.0 pg   MCHC 32.9 30.0 -  36.0 g/dL   RDW 14.5 11.5 - 15.5 %   Platelets 323 150 - 400 K/uL   nRBC 0.0 0.0 - 0.2 %   Neutrophils Relative % 53 %   Neutro Abs 4.4 1.7 - 7.7 K/uL   Lymphocytes Relative 27 %   Lymphs Abs 2.2 0.7 - 4.0 K/uL   Monocytes Relative 12 %   Monocytes Absolute 1.0 0.1 - 1.0 K/uL   Eosinophils Relative 7 %   Eosinophils Absolute 0.6 (H) 0.0 - 0.5 K/uL   Basophils Relative 1 %   Basophils Absolute 0.0 0.0 - 0.1 K/uL   Immature Granulocytes 0 %   Abs Immature Granulocytes 0.03 0.00 - 0.07 K/uL  Magnesium     Status: None   Collection Time: 12/01/22  2:21 AM  Result Value Ref Range   Magnesium 2.2 1.7 - 2.4 mg/dL  Glucose, capillary     Status: Abnormal   Collection Time: 12/01/22  9:37 AM  Result Value Ref Range   Glucose-Capillary 131 (H) 70 - 99 mg/dL    I have reviewed pertinent nursing notes, vitals, labs, and images as necessary. I have ordered labwork to follow up on as indicated.  I have reviewed the last notes from staff over past 24 hours. I have discussed patient's care plan and test results with nursing staff, CM/SW, and other staff as appropriate.  Time spent: Greater than 50% of the 55 minute visit was spent in counseling/coordination of care for the patient as laid out in the A&P.   LOS: 4 days   Dwyane Dee, MD [facility] 12/01/2022, 10:06 AM

## 2022-12-01 NOTE — Op Note (Signed)
Patient name: Kelly Key MRN: [id number] DOB: 1971-09-07 Sex: male  12/01/2022 Pre-operative Diagnosis: Critical limb ischemia of the left lower extremity with tissue loss Post-operative diagnosis:  Same Surgeon:  Marty Heck, MD Procedure Performed: 1.  Ultrasound-guided access right common femoral artery 2.  Aortogram with catheter selection of aorta 3.  Left lower extremity arteriogram with selection of third order branches 4.  Left anterior tibial artery angioplasty (3 mm x 220 mm Sterling) 5.  Angioplasty and stent of left SFA (7 mm x 120 mm drug-coated Eluvia postdilated with a 6 mm Mustang) 6.  Mynx closure of the right common femoral artery 7.  67 minutes of monitored moderate conscious sedation time  Indications: 52 year old male seen in consultation over the weekend by my partner Dr. Virl Cagey with critical limb ischemia and tissue loss in the left lower extremity.  He has multiple risk factors including end-stage renal disease and diabetes.  He presents today for aortogram, lower extremity arteriogram, with possible invention with a focus on the left leg after risks and benefits discussed.  Findings:   Aortogram showed no significant aortoiliac stenosis.  The renal arteries were poorly visualized.  Left lower extremity arteriogram showed a patent common femoral and profunda.  The SFA was patent but had two focal areas of stenosis approximately 60% each with diffuse disease adjacent to these segments.  The popliteal artery was patent.  The runoff below the knee was in the posterior tibial and peroneal that were widely patent.  The anterior tibial was diffusely diseased with a high-grade stenosis throughout the entire artery from the takeoff of the anterior tibial to just above the ankle.  Ultimately we were able to get down the anterior tibial antegrade from contralateral groin access and this was angioplastied with a 3 mm x 220 mm Sterling with excellent results and he now  has three-vessel runoff below the knee.  The left SFA was stented with a 7 mm x 120 mm Eluvia postdilated with a 6 mm Mustang with no residual stenosis.  Has inline flow with no significant stenosis after intervention.   Procedure:  The patient was identified in the holding area and taken to room 8.  The patient was then placed supine on the table and prepped and draped in the usual sterile fashion.  A time out was called.  Patient received Versed and fentanyl for conscious moderate sedation.  Vital signs were monitored including heart rate, respiratory rate, oxygenation and blood pressure.  I was present for all of moderate sedation.  Ultrasound was used to evaluate the right common femoral artery.  It was patent .  A digital ultrasound image was acquired.  A micropuncture needle was used to access the right common femoral artery under ultrasound guidance.  An 018 wire was advanced without resistance and a micropuncture sheath was placed.  The 018 wire was removed and a benson wire was placed.  The micropuncture sheath was exchanged for a 5 french sheath.  An omniflush catheter was advanced over the wire to the level of L-1.  An abdominal angiogram was obtained.  Next, using the omniflush catheter and a benson wire, the aortic bifurcation was crossed and the catheter was placed into theleft external iliac artery and left runoff was obtained.  After evaluating images elected for left leg intervention.  A Glidewire advantage was placed in the left SFA and we placed a long 7 French sheath in the right groin over the aortic bifurcation.  Patient was  given 100 units/kg IV heparin.  I then exchanged for a V18 wire and was able to cannulate the anterior tibial antegrade from contralateral groin access and get all the way down the vessel and crossed the diseased segment into the dorsalis pedis of the foot.  I then confirmed with hand-injection I was in the true lumen distally.  The entire anterior tibial was then  angioplastied with a 3 mm x 220 mm Sterling to nominal pressure for 2 minutes throughout the segment.  He now has a widely patent anterior tibial.  I then went up to the SFA and got a planning shot and elected to treat the SFA disease with a 7 mm x 120 mm Eluvia postdilated with a 6 mm Mustang.  Widely patent SFA stent at completion.  Wires and catheters were removed.  A short 7 French sheath was placed in the right groin.  A mynx closure device was deployed after an access shot was obtained.  Plan: Patient is now optimized after left SFA stent and left anterior tibial angioplasty.  Has inline flow down the left lower extremity with no significant residual stenosis and three-vessel runoff.  Will load on Plavix.  On aspirin statin as well.    Marty Heck, MD [facility] [place] Office: [phone]

## 2022-12-01 NOTE — Progress Notes (Signed)
[facility] Progress Note   Subjective:  Seen in room. S/p aortogram with runoff this AM - s/p angioplasty/stent to L ATA and SFA. Afterwards, was having nausea/vomiting. Still nauseated, but improved. No CP/dyspnea. For 2nd toe amputation tomorrow per notes, no time yet - will plan to work around this for HD tomorrow.  Objective Vitals:   12/01/22 1200 12/01/22 1230 12/01/22 1305 12/01/22 1329  BP: (!) 166/87 (!) 156/82 (!) 159/89 (!) 152/88  Pulse: 87 83 91 90  Resp: 15 11 15 16   Temp:    (!) 97.4 F (36.3 C)  TempSrc:    Oral  SpO2: 93% 97% 97% 98%  Weight:      Height:       Physical Exam General: Well appearing man, NAD Heart: RRR; no murmur Lungs: CTA anteriorly Abdomen: soft Extremities: L 2nd toe with dry gangrene appearance, trace BLE edema Dialysis Access: LUE AVF  Additional Objective Labs: Basic Metabolic Panel: Recent Labs  Lab 11/29/22 0154 11/30/22 0232 12/01/22 0221  NA 129* 133* 132*  K 4.1 3.8 4.1  CL 90* 92* 91*  CO2 23 25 26   GLUCOSE 226* 141* 164*  BUN 70* 59* 94*  CREATININE 10.87* 9.13* 10.84*  CALCIUM 8.0* 8.4* 8.7*  PHOS 5.4* 4.8* 5.5*   Liver Function Tests: Recent Labs  Lab 11/26/22 1851 11/27/22 1200 11/28/22 0448 11/29/22 0154 11/30/22 0232 12/01/22 0221  AST 19 24  --   --   --   --   ALT 17 21  --   --   --   --   ALKPHOS 73 60  --   --   --   --   BILITOT 0.6 0.7  --   --   --   --   PROT 7.6 7.3  --   --   --   --   ALBUMIN 2.9* 2.9*   < > 2.6* 2.8* 2.8*   < > = values in this interval not displayed.   CBC: Recent Labs  Lab 11/27/22 1200 11/28/22 0831 11/28/22 2310 11/30/22 0232 12/01/22 0221  WBC 7.9 7.1 7.9 6.9 8.2  NEUTROABS 5.6 3.7 4.3 3.3 4.4  HGB 8.1* 8.7* 9.8* 8.8* 9.7*  HCT 25.5* 27.5* 30.9* 27.6* 29.5*  MCV 88.9 89.3 88.8 88.7 86.5  PLT 212 221 233 269 323   Blood Culture    Component Value Date/Time   SDES BLOOD vline 11/27/2022 1358   SPECREQUEST  11/27/2022 1358    BOTTLES  DRAWN AEROBIC AND ANAEROBIC Blood Culture adequate volume   CULT  11/27/2022 1358    NO GROWTH 4 DAYS Performed at [facility], [address], [place], [place] [address]    REPTSTATUS PENDING 11/27/2022 1358   Studies/Results: PERIPHERAL VASCULAR CATHETERIZATION  Result Date: 12/01/2022 Images from the original result were not included.   Patient name: Kelly Key   MRN: [id number]        DOB: May 27, 1971            Sex: male  12/01/2022 Pre-operative Diagnosis: Critical limb ischemia of the left lower extremity with tissue loss Post-operative diagnosis:  Same Surgeon:  Marty Heck, MD Procedure Performed: 1.  Ultrasound-guided access right common femoral artery 2.  Aortogram with catheter selection of aorta 3.  Left lower extremity arteriogram with selection of third order branches 4.  Left anterior tibial artery angioplasty (3 mm x 220 mm Sterling) 5.  Angioplasty and stent of left SFA (7 mm x 120  mm drug-coated Eluvia postdilated with a 6 mm Mustang) 6.  Mynx closure of the right common femoral artery 7.  67 minutes of monitored moderate conscious sedation time  Indications: 52 year old male seen in consultation over the weekend by my partner Dr. Virl Cagey with critical limb ischemia and tissue loss in the left lower extremity.  He has multiple risk factors including end-stage renal disease and diabetes.  He presents today for aortogram, lower extremity arteriogram, with possible invention with a focus on the left leg after risks and benefits discussed.  Findings:  Aortogram showed no significant aortoiliac stenosis.  The renal arteries were poorly visualized.  Left lower extremity arteriogram showed a patent common femoral and profunda.  The SFA was patent but had two focal areas of stenosis approximately 60% each with diffuse disease adjacent to these segments.  The popliteal artery was patent.  The runoff below the knee was in the posterior tibial and peroneal that were widely patent.  The anterior  tibial was diffusely diseased with a high-grade stenosis throughout the entire artery from the takeoff of the anterior tibial to just above the ankle.  Ultimately we were able to get down the anterior tibial antegrade from contralateral groin access and this was angioplastied with a 3 mm x 220 mm Sterling with excellent results and he now has three-vessel runoff below the knee.  The left SFA was stented with a 7 mm x 120 mm Eluvia postdilated with a 6 mm Mustang with no residual stenosis.  Has inline flow with no significant stenosis after intervention.             Procedure:  The patient was identified in the holding area and taken to room 8.  The patient was then placed supine on the table and prepped and draped in the usual sterile fashion.  A time out was called.  Patient received Versed and fentanyl for conscious moderate sedation.  Vital signs were monitored including heart rate, respiratory rate, oxygenation and blood pressure.  I was present for all of moderate sedation.  Ultrasound was used to evaluate the right common femoral artery.  It was patent .  A digital ultrasound image was acquired.  A micropuncture needle was used to access the right common femoral artery under ultrasound guidance.  An 018 wire was advanced without resistance and a micropuncture sheath was placed.  The 018 wire was removed and a benson wire was placed.  The micropuncture sheath was exchanged for a 5 french sheath.  An omniflush catheter was advanced over the wire to the level of L-1.  An abdominal angiogram was obtained.  Next, using the omniflush catheter and a benson wire, the aortic bifurcation was crossed and the catheter was placed into theleft external iliac artery and left runoff was obtained.  After evaluating images elected for left leg intervention.  A Glidewire advantage was placed in the left SFA and we placed a long 7 French sheath in the right groin over the aortic bifurcation.  Patient was given 100 units/kg IV  heparin.  I then exchanged for a V18 wire and was able to cannulate the anterior tibial antegrade from contralateral groin access and get all the way down the vessel and crossed the diseased segment into the dorsalis pedis of the foot.  I then confirmed with hand-injection I was in the true lumen distally.  The entire anterior tibial was then angioplastied with a 3 mm x 220 mm Sterling to nominal pressure for 2 minutes throughout the  segment.  He now has a widely patent anterior tibial.  I then went up to the SFA and got a planning shot and elected to treat the SFA disease with a 7 mm x 120 mm Eluvia postdilated with a 6 mm Mustang.  Widely patent SFA stent at completion.  Wires and catheters were removed.  A short 7 French sheath was placed in the right groin.  A mynx closure device was deployed after an access shot was obtained.  Plan: Patient is now optimized after left SFA stent and left anterior tibial angioplasty.  Has inline flow down the left lower extremity with no significant residual stenosis and three-vessel runoff.  Will load on Plavix.  On aspirin statin as well.    Marty Heck, MD [facility] [place] Office: [phone]   Medications:  cefTAZidime (FORTAZ)  IV 1 g (11/29/22 1907)   [START ON 12/02/2022] vancomycin      (feeding supplement) PROSource Plus  30 mL Oral TID WC   ascorbic acid  500 mg Oral Daily   aspirin EC  81 mg Oral Daily   atorvastatin  80 mg Oral Daily   calcitRIOL  2.5 mcg Oral Q T,Th,Sa-HD   carvedilol  12.5 mg Oral BID WC   Chlorhexidine Gluconate Cloth  6 each Topical Q0600   Chlorhexidine Gluconate Cloth  6 each Topical Q0600   cloNIDine  0.1 mg Oral BID   ezetimibe  10 mg Oral QPM   feeding supplement (NEPRO CARB STEADY)  237 mL Oral BID BM   gabapentin  100 mg Oral TID   guaiFENesin-dextromethorphan  10 mL Oral Q8H   heparin  5,000 Units Subcutaneous Q8H   insulin aspart  0-15 Units Subcutaneous TID WC   insulin aspart  0-5  Units Subcutaneous QHS   insulin detemir  40 Units Subcutaneous QHS   Ipratropium-Albuterol  1 puff Inhalation BID   metroNIDAZOLE  500 mg Oral Q12H   multivitamin  1 tablet Oral QHS   nutrition supplement (JUVEN)  1 packet Oral BID BM   pantoprazole  40 mg Oral Daily   sevelamer carbonate  2,400 mg Oral TID WC   zinc sulfate  220 mg Oral Daily    Dialysis Orders: [facility] TTS 4:15hr, 450/500, EDW 138.5kg, 3K/2.5Ca, heparin 2600u loading + 1000u/hr - Mircera 30 mcg (last given on 11/25/22) - Venofer 50 mg weekly (last given on 11/25/22) - Calcitriol 2.5 mcg three days a week   Problem/Plan: ESRD: Continue HD on usual TTS schedule - for HD tomorrow. Left second toe gangrene: S/p angiogram/stent to L SFA this AM. For 2nd toe amputation tomorrow. Ortho and VVS following. Acute hypoxic respiratory failure: Mild overload + recent Hx COVID: Challenge EDW as tolerated, below prior EDW now - needs to be lowered on d/c. Hx COVID pneumonia: Off isolation at this time. Hypertension/volume: BP slightly high. Prev on clonidine 0.3mg  TID -> now 0.1mg  TID, prev on Coreg 25mg  BID -> now 12.5mg  BID and off amlodipine. Follow. Anemia of ESRD: Hgb up to 9.7, Mirera last given 11/25/21 - not due yet.  Secondary hyperparathyroidism: Ca/Phos ok - continue home VDRA + binders (sevelamer) Nutrition: Alb low, continue protein supplements  Veneta Penton, PA-C 12/01/2022, 1:37 PM  [employer]

## 2022-12-01 NOTE — Progress Notes (Addendum)
Patient taken down for left lower extremity angiogram.

## 2022-12-01 NOTE — Assessment & Plan Note (Signed)
-  Underwent angiogram 12/01/22 with angioplasty of left anterior tibial artery and angioplasty plus stent of left SFA - continue asa and statin

## 2022-12-01 NOTE — Progress Notes (Signed)
[facility]  Subjective  - no complaints   Objective (!) 148/94 95 98.5 F (36.9 C) (Oral) 18 93%  Intake/Output Summary (Last 24 hours) at 12/01/2022 0756 Last data filed at 12/01/2022 0630 Gross per 24 hour  Intake 960 ml  Output 400 ml  Net 560 ml      Laboratory Lab Results: Recent Labs    11/30/22 0232 12/01/22 0221  WBC 6.9 8.2  HGB 8.8* 9.7*  HCT 27.6* 29.5*  PLT 269 323   BMET Recent Labs    11/30/22 0232 12/01/22 0221  NA 133* 132*  K 3.8 4.1  CL 92* 91*  CO2 25 26  GLUCOSE 141* 164*  BUN 59* 94*  CREATININE 9.13* 10.84*  CALCIUM 8.4* 8.7*    COAG Lab Results  Component Value Date   INR 1.2 11/05/2022   INR 1.0 11/04/2022   INR 1.1 07/03/2019   No results found for: "PTT"  Assessment/Planning:  Plan aortogram, lower extremity arteriogram, focus on left leg for critical limb ischemia with tissue loss.  Marty Heck 12/01/2022 7:56 AM --

## 2022-12-02 DIAGNOSIS — I96 Gangrene, not elsewhere classified: Secondary | ICD-10-CM

## 2022-12-02 DIAGNOSIS — I739 Peripheral vascular disease, unspecified: Secondary | ICD-10-CM | POA: Diagnosis not present

## 2022-12-02 DIAGNOSIS — M869 Osteomyelitis, unspecified: Secondary | ICD-10-CM | POA: Diagnosis not present

## 2022-12-02 DIAGNOSIS — E1169 Type 2 diabetes mellitus with other specified complication: Secondary | ICD-10-CM | POA: Diagnosis not present

## 2022-12-02 DIAGNOSIS — N186 End stage renal disease: Secondary | ICD-10-CM | POA: Diagnosis not present

## 2022-12-02 LAB — RENAL FUNCTION PANEL
Albumin: 2.7 g/dL — ABNORMAL LOW (ref 3.5–5.0)
Anion gap: 17 — ABNORMAL HIGH (ref 5–15)
BUN: 110 mg/dL — ABNORMAL HIGH (ref 6–20)
CO2: 23 mmol/L (ref 22–32)
Calcium: 8.5 mg/dL — ABNORMAL LOW (ref 8.9–10.3)
Chloride: 90 mmol/L — ABNORMAL LOW (ref 98–111)
Creatinine, Ser: 11.94 mg/dL — ABNORMAL HIGH (ref 0.61–1.24)
GFR, Estimated: 5 mL/min — ABNORMAL LOW (ref >60)
Glucose, Bld: 145 mg/dL — ABNORMAL HIGH (ref 70–99)
Phosphorus: 6.2 mg/dL — ABNORMAL HIGH (ref 2.5–4.6)
Potassium: 4.4 mmol/L (ref 3.5–5.1)
Sodium: 130 mmol/L — ABNORMAL LOW (ref 135–145)

## 2022-12-02 LAB — CBC WITH DIFFERENTIAL/PLATELET
Abs Immature Granulocytes: 0.03 10*3/uL (ref 0.00–0.07)
Basophils Absolute: 0.1 10*3/uL (ref 0.0–0.1)
Basophils Relative: 1 %
Eosinophils Absolute: 0.5 10*3/uL (ref 0.0–0.5)
Eosinophils Relative: 6 %
HCT: 29.5 % — ABNORMAL LOW (ref 39.0–52.0)
Hemoglobin: 9.6 g/dL — ABNORMAL LOW (ref 13.0–17.0)
Immature Granulocytes: 0 %
Lymphocytes Relative: 24 %
Lymphs Abs: 2 10*3/uL (ref 0.7–4.0)
MCH: 28.2 pg (ref 26.0–34.0)
MCHC: 32.5 g/dL (ref 30.0–36.0)
MCV: 86.8 fL (ref 80.0–100.0)
Monocytes Absolute: 0.9 10*3/uL (ref 0.1–1.0)
Monocytes Relative: 11 %
Neutro Abs: 4.7 10*3/uL (ref 1.7–7.7)
Neutrophils Relative %: 58 %
Platelets: 364 10*3/uL (ref 150–400)
RBC: 3.4 MIL/uL — ABNORMAL LOW (ref 4.22–5.81)
RDW: 14.3 % (ref 11.5–15.5)
WBC: 8.2 10*3/uL (ref 4.0–10.5)
nRBC: 0 % (ref 0.0–0.2)

## 2022-12-02 LAB — CULTURE, BLOOD (ROUTINE X 2)
Culture: NO GROWTH
Culture: NO GROWTH
Special Requests: ADEQUATE
Special Requests: ADEQUATE

## 2022-12-02 LAB — GLUCOSE, CAPILLARY
Glucose-Capillary: 127 mg/dL — ABNORMAL HIGH (ref 70–99)
Glucose-Capillary: 141 mg/dL — ABNORMAL HIGH (ref 70–99)
Glucose-Capillary: 232 mg/dL — ABNORMAL HIGH (ref 70–99)
Glucose-Capillary: 236 mg/dL — ABNORMAL HIGH (ref 70–99)

## 2022-12-02 LAB — MAGNESIUM: Magnesium: 2.3 mg/dL (ref 1.7–2.4)

## 2022-12-02 MED ORDER — CHLORHEXIDINE GLUCONATE 4 % EX LIQD
60.0000 mL | Freq: Once | CUTANEOUS | Status: AC
  Administered 2022-12-03: 4 via TOPICAL
  Filled 2022-12-02: qty 60

## 2022-12-02 MED ORDER — CEFAZOLIN IN SODIUM CHLORIDE 3-0.9 GM/100ML-% IV SOLN
3.0000 g | INTRAVENOUS | Status: AC
  Administered 2022-12-03: 3 g via INTRAVENOUS
  Filled 2022-12-02: qty 100

## 2022-12-02 MED ORDER — POVIDONE-IODINE 10 % EX SWAB
2.0000 | Freq: Once | CUTANEOUS | Status: AC
  Administered 2022-12-03: 2 via TOPICAL

## 2022-12-02 NOTE — Progress Notes (Signed)
Pt receives out-pt HD at [facility] on TTS. Pt has a 6:30 am chair time. Will assist as needed.   Melven Sartorius Renal Navigator [phone]

## 2022-12-02 NOTE — Consult Note (Signed)
ORTHOPAEDIC CONSULTATION  REQUESTING PHYSICIAN: Dwyane Dee, MD  Chief Complaint: Ulceration abscess left second toe.  HPI: Kelly Key is a 52 y.o. male who presents with uncontrolled type 2 diabetes end-stage renal disease on dialysis with abscess ulceration and infection of the left second toe.  Past Medical History:  Diagnosis Date   Acute diastolic CHF (congestive heart failure) ([place]) 06/11/2012   EF 50-55% [facility])   Chronic kidney disease    COPD (chronic obstructive pulmonary disease) (HCC)    Diabetes mellitus    A1c 11.5 (06/11/2012).   Dyspnea    Gout    Hepatic steatosis 06/11/2012   Elevated LFTs   Hyperlipemia    Malignant hypertension    Microcytic anemia 06/12/2012   Obesity    Past Surgical History:  Procedure Laterality Date   ABDOMINAL AORTOGRAM W/LOWER EXTREMITY N/A 12/01/2022   Procedure: ABDOMINAL AORTOGRAM W/LOWER EXTREMITY;  Surgeon: Marty Heck, MD;  Location: [place] [facility];  Service: Cardiovascular;  Laterality: N/A;   AV FISTULA PLACEMENT Left 11/11/2019   Procedure: ARTERIOVENOUS (AV) FISTULA CREATION LEFT BRACHIOCEPHALIC ARM;  Surgeon: Serafina Mitchell, MD;  Location: [place];  Service: Vascular;  Laterality: Left;   COLONOSCOPY WITH PROPOFOL N/A 09/02/2021   Procedure: COLONOSCOPY WITH PROPOFOL;  Surgeon: Jonathon Bellows, MD;  Location: [facility] ENDOSCOPY;  Service: Gastroenterology;  Laterality: N/A;   IR FLUORO GUIDE CV LINE RIGHT  07/08/2019   IR REMOVAL TUN CV CATH W/O FL  03/23/2020   IR US GUIDE VASC ACCESS RIGHT  07/08/2019   LEFT HEART CATH AND CORONARY ANGIOGRAPHY N/A 06/12/2021   Procedure: LEFT HEART CATH AND CORONARY ANGIOGRAPHY;  Surgeon: Belva Crome, MD;  Location: [place] [facility];  Service: Cardiovascular;  Laterality: N/A;   NO PAST SURGERIES     PERIPHERAL VASCULAR BALLOON ANGIOPLASTY Left 12/01/2022   Procedure: PERIPHERAL VASCULAR BALLOON ANGIOPLASTY;  Surgeon: Marty Heck, MD;  Location: [place] [facility];  Service: Cardiovascular;  Laterality: Left;  Anterior tibial   PERIPHERAL VASCULAR INTERVENTION Left 12/01/2022   Procedure: PERIPHERAL VASCULAR INTERVENTION;  Surgeon: Marty Heck, MD;  Location: [place] [facility];  Service: Cardiovascular;  Laterality: Left;  sfa   VASCULAR SURGERY     Social History   Socioeconomic History   Marital status: Single    Spouse name: Not on file   Number of children: Not on file   Years of education: Not on file   Highest education level: Not on file  Occupational History   Occupation: retired  Tobacco Use   Smoking status: Never    Passive exposure: Past   Smokeless tobacco: Never  Vaping Use   Vaping Use: Never used  Substance and Sexual Activity   Alcohol use: Not Currently    Comment: haven't drank in over 5 years    Drug use: No   Sexual activity: Not Currently  Other Topics Concern   Not on file  Social History Narrative   Not on file   Social Determinants of Health   Financial Resource Strain: Low Risk  (02/20/2019)   Overall Financial Resource Strain (CARDIA)    Difficulty of Paying Living Expenses: Not hard at all  Food Insecurity: No Food Insecurity (11/26/2022)   Hunger Vital Sign    Worried About Running Out of Food in the Last Year: Never true    [place] in the Last Year: Never true  Transportation Needs: No Transportation Needs (11/26/2022)  PRAPARE - Hydrologist (Medical): No    Lack of Transportation (Non-Medical): No  Physical Activity: Unknown (02/20/2019)   Exercise Vital Sign    Days of Exercise per Week: Patient refused    Minutes of Exercise per Session: Patient refused  Stress: No Stress Concern Present (02/20/2019)   [place]    Feeling of Stress : Not at all  Social Connections: Unknown (02/20/2019)   Social Connection and Isolation Panel [NHANES]    Frequency of Communication with Friends and  Family: Patient refused    Frequency of Social Gatherings with Friends and Family: Patient refused    Attends Religious Services: Patient refused    Marine scientist or Organizations: Patient refused    Attends Music therapist: Patient refused    Marital Status: Patient refused   Family History  Problem Relation Age of Onset   Gout Mother    Asthma Mother    Diabetes Father    Heart failure Father    Diabetes Sister    Hypertension Brother    Pancreatic cancer Brother    Diabetes Sister    - negative except otherwise stated in the family history section Allergies  Allergen Reactions   Ozempic (0.25 Or 0.5 Mg-Dose) [Semaglutide(0.25 Or 0.5mg -Dos)] Shortness Of Breath   Hydralazine Anxiety    Hallucinations    Lisinopril Swelling   Prior to Admission medications   Medication Sig Start Date End Date Taking? Authorizing Provider  acetaminophen (TYLENOL) 500 MG tablet Take 1,000 mg by mouth every 6 (six) hours as needed for moderate pain or headache.    [provider]  albuterol (VENTOLIN HFA) 108 (90 Base) MCG/ACT inhaler Inhale 2 puffs into the lungs every 4 (four) hours as needed for wheezing or shortness of breath.  03/24/19   [provider]  allopurinol (ZYLOPRIM) 100 MG tablet Take 2 tablets (200 mg total) by mouth daily. 08/04/19   Love, Ivan Anchors, PA-C  AMBULATORY NON FORMULARY MEDICATION Inject 0.2 ml by intracavernosal route as directed.  Medication Name: TriMix PGE 10 mcg Pap   30 mg Phent 1 mg 01/01/22   Stoneking, Reece Leader., MD  amLODipine (NORVASC) 5 MG tablet Take 5 mg by mouth daily. 01/30/22   [provider]  ammonium lactate (AMLACTIN) 12 % cream Apply 1 Application topically in the morning and at bedtime. 09/08/22   [provider]  ascorbic acid (VITAMIN C) 500 MG tablet Take 1 tablet (500 mg total) by mouth daily. 11/06/22 12/06/22  Manuella Ghazi, Pratik D, DO  aspirin EC 81 MG EC tablet Take 1 tablet (81 mg total) by  mouth daily. 08/04/19   Love, Ivan Anchors, PA-C  atorvastatin (LIPITOR) 80 MG tablet Take 1 tablet (80 mg total) by mouth daily. 12/05/19   Arnoldo Lenis, MD  Calcium Carbonate Antacid (CALCIUM CARBONATE, DOSED IN MG ELEMENTAL CALCIUM,) 1250 MG/5ML SUSP Take 5 mLs (500 mg of elemental calcium total) by mouth every 6 (six) hours as needed for indigestion. 07/16/19   Amin, Jeanella Flattery, MD  carvedilol (COREG) 25 MG tablet Take 1 tablet (25 mg total) by mouth 2 (two) times daily with a meal. 08/03/19   Love, Ivan Anchors, PA-C  cloNIDine (CATAPRES) 0.3 MG tablet Take 0.3 mg by mouth 3 (three) times daily.    [provider]  esomeprazole (NEXIUM) 20 MG capsule Take 20 mg by mouth daily. 01/23/22   [provider]  ezetimibe (ZETIA) 10 MG tablet Take 10 mg by mouth every evening. 01/20/19   [provider]  fluticasone (FLONASE) 50 MCG/ACT nasal spray Place 1 spray into both nostrils daily as needed for allergies or rhinitis.    [provider]  furosemide (LASIX) 80 MG tablet Take 1 tablet (80 mg total) by mouth daily. 05/16/21   Shahmehdi, Valeria Batman, MD  gabapentin (NEURONTIN) 100 MG capsule Take 1 capsule (100 mg total) by mouth 3 (three) times daily. 08/03/19   Love, Ivan Anchors, PA-C  glucose blood (ACCU-CHEK AVIVA PLUS) test strip Use as instructed TO CHECK BLOOD GLUCOSE THREE TIMES DAILY 08/15/20   Nida, Marella Chimes, MD  guaiFENesin-dextromethorphan (ROBITUSSIN DM) 100-10 MG/5ML syrup Take 10 mLs by mouth every 8 (eight) hours. 11/05/22   Manuella Ghazi, Pratik D, DO  HUMALOG KWIKPEN 100 UNIT/ML KwikPen Inject 8-14 Units into the skin 3 (three) times daily. 08/19/22   [provider]  hydrOXYzine (ATARAX) 25 MG tablet Take 25 mg by mouth 3 (three) times daily as needed for anxiety. 11/28/21   [provider]  Insulin Pen Needle (B-D ULTRAFINE III SHORT PEN) 31G X 8 MM MISC 1 each by Does not apply route as directed. 08/13/20   Cassandria Anger, MD   Ipratropium-Albuterol (COMBIVENT) 20-100 MCG/ACT AERS respimat Inhale 1 puff into the lungs every 6 (six) hours as needed for wheezing or shortness of breath. 11/05/22 12/05/22  Manuella Ghazi, Pratik D, DO  LEVEMIR FLEXTOUCH 100 UNIT/ML FlexTouch Pen INJECT 50 UNITS SUBCUTANEOUSLY AT BEDTIME Patient taking differently: Inject 50 Units into the skin at bedtime. 07/09/21   Brita Romp, NP  linaclotide Rolan Lipa) 145 MCG CAPS capsule Take 1 capsule (145 mcg total) by mouth daily before breakfast. Patient not taking: Reported on 11/04/2022 04/05/18   Heath Lark D, DO  multivitamin (RENA-VIT) TABS tablet Take 1 tablet by mouth at bedtime. 08/03/19   Love, Ivan Anchors, PA-C  mupirocin ointment (BACTROBAN) 2 % Apply 1 Application topically 2 (two) times daily.    [provider]  OZEMPIC, 1 MG/DOSE, 4 MG/3ML SOPN Inject 1 mg into the skin once a week. Fridays.    [provider]  sevelamer carbonate (RENVELA) 800 MG tablet Take 3 tablets (2,400 mg total) by mouth 3 (three) times daily with meals. 08/03/19   Love, Ivan Anchors, PA-C  vardenafil (LEVITRA) 20 MG tablet Take 1 tablet (20 mg total) by mouth daily as needed for erectile dysfunction. 08/16/21   Stoneking, Reece Leader., MD  zinc sulfate 220 (50 Zn) MG capsule Take 1 capsule (220 mg total) by mouth daily. 11/06/22 12/06/22  Heath Lark D, DO   PERIPHERAL VASCULAR CATHETERIZATION  Result Date: 12/01/2022 Images from the original result were not included.   Patient name: CARLE FENECH   MRN: [id number]        DOB: 1971/01/18            Sex: male  12/01/2022 Pre-operative Diagnosis: Critical limb ischemia of the left lower extremity with tissue loss Post-operative diagnosis:  Same Surgeon:  Marty Heck, MD Procedure Performed: 1.  Ultrasound-guided access right common femoral artery 2.  Aortogram with catheter selection of aorta 3.  Left lower extremity arteriogram with selection of third order branches 4.  Left anterior tibial artery angioplasty (3 mm  x 220 mm Sterling) 5.  Angioplasty and stent of left SFA (7 mm x 120 mm drug-coated Eluvia postdilated with a 6 mm Mustang) 6.  Mynx closure of the right common  femoral artery 7.  67 minutes of monitored moderate conscious sedation time  Indications: 52 year old male seen in consultation over the weekend by my partner Dr. Virl Cagey with critical limb ischemia and tissue loss in the left lower extremity.  He has multiple risk factors including end-stage renal disease and diabetes.  He presents today for aortogram, lower extremity arteriogram, with possible invention with a focus on the left leg after risks and benefits discussed.  Findings:  Aortogram showed no significant aortoiliac stenosis.  The renal arteries were poorly visualized.  Left lower extremity arteriogram showed a patent common femoral and profunda.  The SFA was patent but had two focal areas of stenosis approximately 60% each with diffuse disease adjacent to these segments.  The popliteal artery was patent.  The runoff below the knee was in the posterior tibial and peroneal that were widely patent.  The anterior tibial was diffusely diseased with a high-grade stenosis throughout the entire artery from the takeoff of the anterior tibial to just above the ankle.  Ultimately we were able to get down the anterior tibial antegrade from contralateral groin access and this was angioplastied with a 3 mm x 220 mm Sterling with excellent results and he now has three-vessel runoff below the knee.  The left SFA was stented with a 7 mm x 120 mm Eluvia postdilated with a 6 mm Mustang with no residual stenosis.  Has inline flow with no significant stenosis after intervention.             Procedure:  The patient was identified in the holding area and taken to room 8.  The patient was then placed supine on the table and prepped and draped in the usual sterile fashion.  A time out was called.  Patient received Versed and fentanyl for conscious moderate sedation.  Vital  signs were monitored including heart rate, respiratory rate, oxygenation and blood pressure.  I was present for all of moderate sedation.  Ultrasound was used to evaluate the right common femoral artery.  It was patent .  A digital ultrasound image was acquired.  A micropuncture needle was used to access the right common femoral artery under ultrasound guidance.  An 018 wire was advanced without resistance and a micropuncture sheath was placed.  The 018 wire was removed and a benson wire was placed.  The micropuncture sheath was exchanged for a 5 french sheath.  An omniflush catheter was advanced over the wire to the level of L-1.  An abdominal angiogram was obtained.  Next, using the omniflush catheter and a benson wire, the aortic bifurcation was crossed and the catheter was placed into theleft external iliac artery and left runoff was obtained.  After evaluating images elected for left leg intervention.  A Glidewire advantage was placed in the left SFA and we placed a long 7 French sheath in the right groin over the aortic bifurcation.  Patient was given 100 units/kg IV heparin.  I then exchanged for a V18 wire and was able to cannulate the anterior tibial antegrade from contralateral groin access and get all the way down the vessel and crossed the diseased segment into the dorsalis pedis of the foot.  I then confirmed with hand-injection I was in the true lumen distally.  The entire anterior tibial was then angioplastied with a 3 mm x 220 mm Sterling to nominal pressure for 2 minutes throughout the segment.  He now has a widely patent anterior tibial.  I then went up to the SFA  and got a planning shot and elected to treat the SFA disease with a 7 mm x 120 mm Eluvia postdilated with a 6 mm Mustang.  Widely patent SFA stent at completion.  Wires and catheters were removed.  A short 7 French sheath was placed in the right groin.  A mynx closure device was deployed after an access shot was obtained.  Plan: Patient  is now optimized after left SFA stent and left anterior tibial angioplasty.  Has inline flow down the left lower extremity with no significant residual stenosis and three-vessel runoff.  Will load on Plavix.  On aspirin statin as well.    Marty Heck, MD [facility] [place] Office: [phone]   - pertinent xrays, CT, MRI studies were reviewed and independently interpreted  Positive ROS: All other systems have been reviewed and were otherwise negative with the exception of those mentioned in the HPI and as above.  Physical Exam: General: Alert, no acute distress Psychiatric: Patient is competent for consent with normal mood and affect Lymphatic: No axillary or cervical lymphadenopathy Cardiovascular: No pedal edema Respiratory: No cyanosis, no use of accessory musculature GI: No organomegaly, abdomen is soft and non-tender    Images:  @ENCIMAGES @  Labs:  Lab Results  Component Value Date   HGBA1C 7.4 (H) 10/18/2022   HGBA1C 8.0 (H) 11/13/2021   HGBA1C 8.2 (H) 05/14/2021   ESRSEDRATE 118 (H) 11/28/2022   ESRSEDRATE 68 (H) 11/13/2021   CRP 17.0 (H) 11/28/2022   CRP 3.5 (H) 11/05/2022   CRP 6.9 11/13/2021   LABURIC 7.1 07/17/2019   LABURIC 9.7 (H) 06/23/2019   LABURIC 9.9 (H) 02/20/2019   REPTSTATUS 12/02/2022 FINAL 11/27/2022   GRAMSTAIN  02/20/2019    CYTOSPIN SMEAR NO ORGANISMS SEEN WBC PRESENT, PREDOMINANTLY PMN Performed at [facility], [address] [place], [place] [address]    CULT  11/27/2022    NO GROWTH 5 DAYS Performed at [facility], [address], [place],  [address]     Lab Results  Component Value Date   ALBUMIN 2.7 (L) 12/02/2022   ALBUMIN 2.8 (L) 12/01/2022   ALBUMIN 2.8 (L) 11/30/2022   PREALBUMIN 22 11/28/2022   LABURIC 7.1 07/17/2019   LABURIC 9.7 (H) 06/23/2019   LABURIC 9.9 (H) 02/20/2019        Latest Ref Rng & Units 12/02/2022    2:56 AM 12/01/2022    2:21 AM 11/30/2022    2:32 AM  CBC EXTENDED   WBC 4.0 - 10.5 K/uL 8.2  8.2  6.9   RBC 4.22 - 5.81 MIL/uL 3.40  3.41  3.11   Hemoglobin 13.0 - 17.0 g/dL 9.6  9.7  8.8   HCT 39.0 - 52.0 % 29.5  29.5  27.6   Platelets 150 - 400 K/uL 364  323  269   NEUT# 1.7 - 7.7 K/uL 4.7  4.4  3.3   Lymph# 0.7 - 4.0 K/uL 2.0  2.2  1.7     Neurologic: Patient does not have protective sensation bilateral lower extremities.   MUSCULOSKELETAL:   Skin: Examination patient has necrotic changes to the left second toe with exposed bone.  Patient is status post revascularization to the left lower extremity is a strong palpable dorsalis pedis pulse.  Hemoglobin 9.6 white blood cell count 8.2 with an albumin of 2.7 and hemoglobin A1c of 7.4.  There is no ascending cellulitis.  Assessment: Assessment: Type 2 diabetes with end-stage renal disease on dialysis with peripheral vascular  disease status post revascularization to the left lower extremity with abscess ulceration osteomyelitis of the left second toe.  Plan: Plan: Will plan for left second ray amputation on Wednesday.  Risks and benefits were discussed including risk of the wound not healing need for additional surgery.  Patient states he understands wished to proceed at this time.  Thank you for the consult and the opportunity to see Mr. Joslyn Hy, MD [place] [phone] 11:40 AM

## 2022-12-02 NOTE — Anesthesia Preprocedure Evaluation (Signed)
Anesthesia Evaluation  Patient identified by MRN, date of birth, ID band Patient awake    Reviewed: Allergy & Precautions, NPO status , Patient's Chart, lab work & pertinent test results, reviewed documented beta blocker date and time   Airway Mallampati: III  TM Distance: >3 FB Neck ROM: Full    Dental  (+) Dental Advisory Given, Edentulous Upper   Pulmonary sleep apnea , COPD   Pulmonary exam normal breath sounds clear to auscultation       Cardiovascular hypertension, Pt. on medications and Pt. on home beta blockers + Past MI, + Peripheral Vascular Disease and +CHF  Normal cardiovascular exam Rhythm:Regular Rate:Normal     Neuro/Psych negative neurological ROS     GI/Hepatic Neg liver ROS,GERD  Medicated,,  Endo/Other  diabetes, Poorly Controlled, Type 2, Insulin Dependent    Renal/GU ESRF and DialysisRenal disease     Musculoskeletal  (+) Arthritis ,  Gangrene Left 2nd Toe   Abdominal   Peds  Hematology  (+) Blood dyscrasia, anemia   Anesthesia Other Findings   Reproductive/Obstetrics                             Anesthesia Physical Anesthesia Plan  ASA: 4  Anesthesia Plan: Regional   Post-op Pain Management: Regional block* and Tylenol PO (pre-op)*   Induction: Intravenous  PONV Risk Score and Plan: 1 and TIVA and Treatment may vary due to age or medical condition  Airway Management Planned: Natural Airway and Simple Face Mask  Additional Equipment:   Intra-op Plan:   Post-operative Plan:   Informed Consent: I have reviewed the patients History and Physical, chart, labs and discussed the procedure including the risks, benefits and alternatives for the proposed anesthesia with the patient or authorized representative who has indicated his/her understanding and acceptance.     Dental advisory given  Plan Discussed with: CRNA  Anesthesia Plan Comments:         Anesthesia Quick Evaluation

## 2022-12-02 NOTE — Progress Notes (Addendum)
[facility] [place]  Subjective  - States the left foot feels better   Objective (!) 165/92 (!) 101 97.7 F (36.5 C) (Oral) 14 97% No intake or output data in the 24 hours ending 12/02/22 0741  Palpable DP pulses left LE right groin soft without hematoma Lungs non labored breathing Heart RRR  Assessment/Planning: POD # Left anterior tibial artery angioplasty   Angioplasty and stent of left SFA   Improved in flow to the left LE with Palpable DP pulse ASA, Plavix and Statin daily for maximum medical therapy F/U with Left LE arterial duplex and ABI in 4 weeks Stable post intervention    Roxy Horseman 12/02/2022 7:41 AM --  Laboratory Lab Results: Recent Labs    12/01/22 0221 12/02/22 0256  WBC 8.2 8.2  HGB 9.7* 9.6*  HCT 29.5* 29.5*  PLT 323 364   BMET Recent Labs    12/01/22 0221 12/02/22 0256  NA 132* 130*  K 4.1 4.4  CL 91* 90*  CO2 26 23  GLUCOSE 164* 145*  BUN 94* 110*  CREATININE 10.84* 11.94*  CALCIUM 8.7* 8.5*    COAG Lab Results  Component Value Date   INR 1.2 11/05/2022   INR 1.0 11/04/2022   INR 1.1 07/03/2019   No results found for: "PTT"  I have seen and evaluated the patient. I agree with the PA note as documented above. Excellent results after left SFA stenting with AT angioplasty.  Palpable pedal pulse in the left foot.  Dr. Sharol Given following for tissue loss.  Aspirin statin Plavix.  Will arrange follow-up in 1 month in office.  Marty Heck, MD [facility] [place] Office: [phone]

## 2022-12-02 NOTE — TOC Initial Note (Signed)
Transition of Care [facility]) - Initial/Assessment Note    Patient Details  Name: Kelly Key MRN: [id number] Date of Birth: 31-Mar-1971  Transition [facility]) CM/SW Contact:    Bethena Roys, RN Phone Number: 12/02/2022, 4:04 PM  Clinical Narrative: Risk for readmission assessment completed. Patient presented as a a transfer from [facility]. ID is following the patient for wet/gangrene osteomyelitis of second left foot/ 2nd toe. Plan for left second toe amputation 12-03-22.  PTA patient was from home alone; however, has support of a significant other. Patient also has support of sister Hilda Blades. Patient has DME cane, rolling walker, and wheelchair in the home. Patient has a history of ESRD TTS schedule. Case Manager will continue to follow for transition of care needs.                   Expected Discharge Plan: [place] Barriers to Discharge: Continued Medical Work up   Patient Goals and CMS Choice Patient states their goals for this hospitalization and ongoing recovery are:: return home   Choice offered to / list presented to : Patient      Expected Discharge Plan and Services In-house Referral: Clinical Social Work Discharge Planning Services: CM Consult Post Acute Care Choice: [place] arrangements for the past 2 months: Single Family Home                   DME Agency: NA    Prior Living Arrangements/Services Living arrangements for the past 2 months: Single Family Home Lives with:: Self Patient language and need for interpreter reviewed:: Yes Do you feel safe going back to the place where you live?: Yes      Need for Family Participation in Patient Care: Yes (Comment) Care giver support system in place?: Yes (comment) Current home services: DME (cane, walker, wheelchair) Criminal Activity/Legal Involvement Pertinent to Current Situation/Hospitalization: No - Comment as needed  Activities of Daily Living Home Assistive  Devices/Equipment: Bathtub lift ADL Screening (condition at time of admission) Patient's cognitive ability adequate to safely complete daily activities?: Yes Is the patient deaf or have difficulty hearing?: No Does the patient have difficulty seeing, even when wearing glasses/contacts?: No Does the patient have difficulty concentrating, remembering, or making decisions?: No Patient able to express need for assistance with ADLs?: Yes Does the patient have difficulty dressing or bathing?: No Independently performs ADLs?: Yes (appropriate for developmental age) Communication: Independent Dressing (OT): Independent Grooming: Independent Feeding: Independent Bathing: Independent Toileting: Independent In/Out Bed: Independent Walks in Home: Independent Does the patient have difficulty walking or climbing stairs?: Yes Weakness of Legs: None Weakness of Arms/Hands: None  Permission Sought/Granted Permission sought to share information with : Case Manager, Family Supports   Emotional Assessment Appearance:: Appears stated age       Alcohol / Substance Use: Not Applicable Psych Involvement: No (comment)  Admission diagnosis:  Acute respiratory failure with hypoxia ([place]) [J96.01] Patient Active Problem List   Diagnosis Date Noted   PAD (peripheral artery disease) ([place]) 12/01/2022   Gangrene of toe of left foot ([place]) 11/28/2022   Diabetes mellitus type 2 in obese ([place]) 11/27/2022   GERD (gastroesophageal reflux disease) 11/27/2022   Flash pulmonary edema ([place]) 11/04/2022   Dysphagia 10/18/2022   Osteomyelitis of second toe of left foot ([place]) 11/13/2021   OSA (obstructive sleep apnea) 10/04/2021   Obesity hypoventilation syndrome ([place]) 10/04/2021   Organic impotence 08/16/2021   NSTEMI (non-ST elevated myocardial infarction) (  [place]) 05/17/2021   Acute gout of right knee 08/03/2019   Orthostasis    Pain    Physical debility 07/16/2019   ESRD (end stage renal disease) ([employer])     Anemia of chronic disease    Uncontrolled type 2 diabetes mellitus with hyperglycemia (HCC)    Volume overload 07/05/2019   Anasarca 07/03/2019   Dyslipidemia 07/03/2019   Chronic combined systolic and diastolic CHF (congestive heart failure) ([place]) 07/03/2019   Bilateral leg edema    Hypokalemia    Thromboembolism ([place]) 02/20/2019   Diabetes mellitus type 2 in nonobese ([place]) 06/12/2012   Hyponatremia 06/12/2012   Microcytic anemia 06/12/2012   Essential hypertension, benign 06/11/2012   Elevated LFTs 06/11/2012   Morbid obesity ([place]) 06/11/2012   Hepatic steatosis 06/11/2012   PCP:  [name], MD Pharmacy:   [facility] [address], [place] [address] Phone: [phone] Fax: [place], [place] [address] [place] [address] Phone: [phone] Fax: [phone]  Social Determinants of Health (SDOH) Social History: SDOH Screenings   Food Insecurity: No Food Insecurity (11/26/2022)  Housing: Low Risk  (11/26/2022)  Transportation Needs: No Transportation Needs (11/26/2022)  Utilities: Not At Risk (11/26/2022)  Depression (PHQ2-9): Low Risk  (12/25/2021)  Financial Resource Strain: Low Risk  (02/20/2019)  Physical Activity: Unknown (02/20/2019)  Social Connections: Unknown (02/20/2019)  Stress: No Stress Concern Present (02/20/2019)  Tobacco Use: Low Risk  (12/01/2022)   Readmission Risk Interventions    12/02/2022    4:02 PM 11/28/2022    1:43 PM 11/04/2022    1:04 PM  Readmission Risk Prevention Plan  Transportation Screening Complete Complete Complete  Medication Review Press photographer) Complete Complete Complete  HRI or Home Care Consult Complete Complete Complete  SW Recovery Care/Counseling Consult Complete Complete Complete  Palliative Care Screening Not Applicable Not Applicable Not Applicable  Skilled Nursing Facility Not Applicable Not Applicable Not Applicable

## 2022-12-02 NOTE — Progress Notes (Signed)
Received patient in bed to unit.  Alert and oriented.  Informed consent signed and in chart.   [place] duration:3h 24min  Patient tolerated well.  Transported back to the room  Alert, without acute distress.  Hand-off given to patient's nurse.   Access used: AVF Access issues: none  Total UF removed: 2.5L Medication(s) given: Zofran Post HD VS: 157/106,100,90%,8,97.7 Post HD weight: 137.7kg   [facility]

## 2022-12-02 NOTE — H&P (View-Only) (Signed)
ORTHOPAEDIC CONSULTATION  REQUESTING PHYSICIAN: Dwyane Dee, MD  Chief Complaint: Ulceration abscess left second toe.  HPI: Kelly Key is a 52 y.o. male who presents with uncontrolled type 2 diabetes end-stage renal disease on dialysis with abscess ulceration and infection of the left second toe.  Past Medical History:  Diagnosis Date   Acute diastolic CHF (congestive heart failure) ([place]) 06/11/2012   EF 50-55% [facility])   Chronic kidney disease    COPD (chronic obstructive pulmonary disease) (HCC)    Diabetes mellitus    A1c 11.5 (06/11/2012).   Dyspnea    Gout    Hepatic steatosis 06/11/2012   Elevated LFTs   Hyperlipemia    Malignant hypertension    Microcytic anemia 06/12/2012   Obesity    Past Surgical History:  Procedure Laterality Date   ABDOMINAL AORTOGRAM W/LOWER EXTREMITY N/A 12/01/2022   Procedure: ABDOMINAL AORTOGRAM W/LOWER EXTREMITY;  Surgeon: Marty Heck, MD;  Location: [place] CV LAB;  Service: Cardiovascular;  Laterality: N/A;   AV FISTULA PLACEMENT Left 11/11/2019   Procedure: ARTERIOVENOUS (AV) FISTULA CREATION LEFT BRACHIOCEPHALIC ARM;  Surgeon: Serafina Mitchell, MD;  Location: [place];  Service: Vascular;  Laterality: Left;   COLONOSCOPY WITH PROPOFOL N/A 09/02/2021   Procedure: COLONOSCOPY WITH PROPOFOL;  Surgeon: Jonathon Bellows, MD;  Location: [facility] ENDOSCOPY;  Service: Gastroenterology;  Laterality: N/A;   IR FLUORO GUIDE CV LINE RIGHT  07/08/2019   IR REMOVAL TUN CV CATH W/O FL  03/23/2020   IR US GUIDE VASC ACCESS RIGHT  07/08/2019   LEFT HEART CATH AND CORONARY ANGIOGRAPHY N/A 06/12/2021   Procedure: LEFT HEART CATH AND CORONARY ANGIOGRAPHY;  Surgeon: Belva Crome, MD;  Location: [place] [facility];  Service: Cardiovascular;  Laterality: N/A;   NO PAST SURGERIES     PERIPHERAL VASCULAR BALLOON ANGIOPLASTY Left 12/01/2022   Procedure: PERIPHERAL VASCULAR BALLOON ANGIOPLASTY;  Surgeon: Marty Heck, MD;  Location: [place] CV  LAB;  Service: Cardiovascular;  Laterality: Left;  Anterior tibial   PERIPHERAL VASCULAR INTERVENTION Left 12/01/2022   Procedure: PERIPHERAL VASCULAR INTERVENTION;  Surgeon: Marty Heck, MD;  Location: [place] [facility];  Service: Cardiovascular;  Laterality: Left;  sfa   VASCULAR SURGERY     Social History   Socioeconomic History   Marital status: Single    Spouse name: Not on file   Number of children: Not on file   Years of education: Not on file   Highest education level: Not on file  Occupational History   Occupation: retired  Tobacco Use   Smoking status: Never    Passive exposure: Past   Smokeless tobacco: Never  Vaping Use   Vaping Use: Never used  Substance and Sexual Activity   Alcohol use: Not Currently    Comment: haven't drank in over 5 years    Drug use: No   Sexual activity: Not Currently  Other Topics Concern   Not on file  Social History Narrative   Not on file   Social Determinants of Health   Financial Resource Strain: Low Risk  (02/20/2019)   Overall Financial Resource Strain (CARDIA)    Difficulty of Paying Living Expenses: Not hard at all  Food Insecurity: No Food Insecurity (11/26/2022)   Hunger Vital Sign    Worried About Running Out of Food in the Last Year: Never true    [place] in the Last Year: Never true  Transportation Needs: No Transportation Needs (11/26/2022)  PRAPARE - Hydrologist (Medical): No    Lack of Transportation (Non-Medical): No  Physical Activity: Unknown (02/20/2019)   Exercise Vital Sign    Days of Exercise per Week: Patient refused    Minutes of Exercise per Session: Patient refused  Stress: No Stress Concern Present (02/20/2019)   [place]    Feeling of Stress : Not at all  Social Connections: Unknown (02/20/2019)   Social Connection and Isolation Panel [NHANES]    Frequency of Communication with Friends and  Family: Patient refused    Frequency of Social Gatherings with Friends and Family: Patient refused    Attends Religious Services: Patient refused    Marine scientist or Organizations: Patient refused    Attends Music therapist: Patient refused    Marital Status: Patient refused   Family History  Problem Relation Age of Onset   Gout Mother    Asthma Mother    Diabetes Father    Heart failure Father    Diabetes Sister    Hypertension Brother    Pancreatic cancer Brother    Diabetes Sister    - negative except otherwise stated in the family history section Allergies  Allergen Reactions   Ozempic (0.25 Or 0.5 Mg-Dose) [Semaglutide(0.25 Or 0.5mg -Dos)] Shortness Of Breath   Hydralazine Anxiety    Hallucinations    Lisinopril Swelling   Prior to Admission medications   Medication Sig Start Date End Date Taking? Authorizing Provider  acetaminophen (TYLENOL) 500 MG tablet Take 1,000 mg by mouth every 6 (six) hours as needed for moderate pain or headache.    [provider]  albuterol (VENTOLIN HFA) 108 (90 Base) MCG/ACT inhaler Inhale 2 puffs into the lungs every 4 (four) hours as needed for wheezing or shortness of breath.  03/24/19   [provider]  allopurinol (ZYLOPRIM) 100 MG tablet Take 2 tablets (200 mg total) by mouth daily. 08/04/19   Love, Ivan Anchors, PA-C  AMBULATORY NON FORMULARY MEDICATION Inject 0.2 ml by intracavernosal route as directed.  Medication Name: TriMix PGE 10 mcg Pap   30 mg Phent 1 mg 01/01/22   Stoneking, Reece Leader., MD  amLODipine (NORVASC) 5 MG tablet Take 5 mg by mouth daily. 01/30/22   [provider]  ammonium lactate (AMLACTIN) 12 % cream Apply 1 Application topically in the morning and at bedtime. 09/08/22   [provider]  ascorbic acid (VITAMIN C) 500 MG tablet Take 1 tablet (500 mg total) by mouth daily. 11/06/22 12/06/22  Manuella Ghazi, Pratik D, DO  aspirin EC 81 MG EC tablet Take 1 tablet (81 mg total) by  mouth daily. 08/04/19   Love, Ivan Anchors, PA-C  atorvastatin (LIPITOR) 80 MG tablet Take 1 tablet (80 mg total) by mouth daily. 12/05/19   Arnoldo Lenis, MD  Calcium Carbonate Antacid (CALCIUM CARBONATE, DOSED IN MG ELEMENTAL CALCIUM,) 1250 MG/5ML SUSP Take 5 mLs (500 mg of elemental calcium total) by mouth every 6 (six) hours as needed for indigestion. 07/16/19   Amin, Jeanella Flattery, MD  carvedilol (COREG) 25 MG tablet Take 1 tablet (25 mg total) by mouth 2 (two) times daily with a meal. 08/03/19   Love, Ivan Anchors, PA-C  cloNIDine (CATAPRES) 0.3 MG tablet Take 0.3 mg by mouth 3 (three) times daily.    [provider]  esomeprazole (NEXIUM) 20 MG capsule Take 20 mg by mouth daily. 01/23/22   [provider]  ezetimibe (ZETIA) 10 MG tablet Take 10 mg by mouth every evening. 01/20/19   [provider]  fluticasone (FLONASE) 50 MCG/ACT nasal spray Place 1 spray into both nostrils daily as needed for allergies or rhinitis.    [provider]  furosemide (LASIX) 80 MG tablet Take 1 tablet (80 mg total) by mouth daily. 05/16/21   Shahmehdi, Valeria Batman, MD  gabapentin (NEURONTIN) 100 MG capsule Take 1 capsule (100 mg total) by mouth 3 (three) times daily. 08/03/19   Love, Ivan Anchors, PA-C  glucose blood (ACCU-CHEK AVIVA PLUS) test strip Use as instructed TO CHECK BLOOD GLUCOSE THREE TIMES DAILY 08/15/20   Nida, Marella Chimes, MD  guaiFENesin-dextromethorphan (ROBITUSSIN DM) 100-10 MG/5ML syrup Take 10 mLs by mouth every 8 (eight) hours. 11/05/22   Manuella Ghazi, Pratik D, DO  HUMALOG KWIKPEN 100 UNIT/ML KwikPen Inject 8-14 Units into the skin 3 (three) times daily. 08/19/22   [provider]  hydrOXYzine (ATARAX) 25 MG tablet Take 25 mg by mouth 3 (three) times daily as needed for anxiety. 11/28/21   [provider]  Insulin Pen Needle (B-D ULTRAFINE III SHORT PEN) 31G X 8 MM MISC 1 each by Does not apply route as directed. 08/13/20   Cassandria Anger, MD   Ipratropium-Albuterol (COMBIVENT) 20-100 MCG/ACT AERS respimat Inhale 1 puff into the lungs every 6 (six) hours as needed for wheezing or shortness of breath. 11/05/22 12/05/22  Manuella Ghazi, Pratik D, DO  LEVEMIR FLEXTOUCH 100 UNIT/ML FlexTouch Pen INJECT 50 UNITS SUBCUTANEOUSLY AT BEDTIME Patient taking differently: Inject 50 Units into the skin at bedtime. 07/09/21   Brita Romp, NP  linaclotide Rolan Lipa) 145 MCG CAPS capsule Take 1 capsule (145 mcg total) by mouth daily before breakfast. Patient not taking: Reported on 11/04/2022 04/05/18   Heath Lark D, DO  multivitamin (RENA-VIT) TABS tablet Take 1 tablet by mouth at bedtime. 08/03/19   Love, Ivan Anchors, PA-C  mupirocin ointment (BACTROBAN) 2 % Apply 1 Application topically 2 (two) times daily.    [provider]  OZEMPIC, 1 MG/DOSE, 4 MG/3ML SOPN Inject 1 mg into the skin once a week. Fridays.    [provider]  sevelamer carbonate (RENVELA) 800 MG tablet Take 3 tablets (2,400 mg total) by mouth 3 (three) times daily with meals. 08/03/19   Love, Ivan Anchors, PA-C  vardenafil (LEVITRA) 20 MG tablet Take 1 tablet (20 mg total) by mouth daily as needed for erectile dysfunction. 08/16/21   Stoneking, Reece Leader., MD  zinc sulfate 220 (50 Zn) MG capsule Take 1 capsule (220 mg total) by mouth daily. 11/06/22 12/06/22  Heath Lark D, DO   PERIPHERAL VASCULAR CATHETERIZATION  Result Date: 12/01/2022 Images from the original result were not included.   Patient name: Kelly Key   MRN: [id number]        DOB: 05-24-1971            Sex: male  12/01/2022 Pre-operative Diagnosis: Critical limb ischemia of the left lower extremity with tissue loss Post-operative diagnosis:  Same Surgeon:  Marty Heck, MD Procedure Performed: 1.  Ultrasound-guided access right common femoral artery 2.  Aortogram with catheter selection of aorta 3.  Left lower extremity arteriogram with selection of third order branches 4.  Left anterior tibial artery angioplasty (3 mm  x 220 mm Sterling) 5.  Angioplasty and stent of left SFA (7 mm x 120 mm drug-coated Eluvia postdilated with a 6 mm Mustang) 6.  Mynx closure of the right common  femoral artery 7.  67 minutes of monitored moderate conscious sedation time  Indications: 52 year old male seen in consultation over the weekend by my partner Dr. Virl Cagey with critical limb ischemia and tissue loss in the left lower extremity.  He has multiple risk factors including end-stage renal disease and diabetes.  He presents today for aortogram, lower extremity arteriogram, with possible invention with a focus on the left leg after risks and benefits discussed.  Findings:  Aortogram showed no significant aortoiliac stenosis.  The renal arteries were poorly visualized.  Left lower extremity arteriogram showed a patent common femoral and profunda.  The SFA was patent but had two focal areas of stenosis approximately 60% each with diffuse disease adjacent to these segments.  The popliteal artery was patent.  The runoff below the knee was in the posterior tibial and peroneal that were widely patent.  The anterior tibial was diffusely diseased with a high-grade stenosis throughout the entire artery from the takeoff of the anterior tibial to just above the ankle.  Ultimately we were able to get down the anterior tibial antegrade from contralateral groin access and this was angioplastied with a 3 mm x 220 mm Sterling with excellent results and he now has three-vessel runoff below the knee.  The left SFA was stented with a 7 mm x 120 mm Eluvia postdilated with a 6 mm Mustang with no residual stenosis.  Has inline flow with no significant stenosis after intervention.             Procedure:  The patient was identified in the holding area and taken to room 8.  The patient was then placed supine on the table and prepped and draped in the usual sterile fashion.  A time out was called.  Patient received Versed and fentanyl for conscious moderate sedation.  Vital  signs were monitored including heart rate, respiratory rate, oxygenation and blood pressure.  I was present for all of moderate sedation.  Ultrasound was used to evaluate the right common femoral artery.  It was patent .  A digital ultrasound image was acquired.  A micropuncture needle was used to access the right common femoral artery under ultrasound guidance.  An 018 wire was advanced without resistance and a micropuncture sheath was placed.  The 018 wire was removed and a benson wire was placed.  The micropuncture sheath was exchanged for a 5 french sheath.  An omniflush catheter was advanced over the wire to the level of L-1.  An abdominal angiogram was obtained.  Next, using the omniflush catheter and a benson wire, the aortic bifurcation was crossed and the catheter was placed into theleft external iliac artery and left runoff was obtained.  After evaluating images elected for left leg intervention.  A Glidewire advantage was placed in the left SFA and we placed a long 7 French sheath in the right groin over the aortic bifurcation.  Patient was given 100 units/kg IV heparin.  I then exchanged for a V18 wire and was able to cannulate the anterior tibial antegrade from contralateral groin access and get all the way down the vessel and crossed the diseased segment into the dorsalis pedis of the foot.  I then confirmed with hand-injection I was in the true lumen distally.  The entire anterior tibial was then angioplastied with a 3 mm x 220 mm Sterling to nominal pressure for 2 minutes throughout the segment.  He now has a widely patent anterior tibial.  I then went up to the SFA  and got a planning shot and elected to treat the SFA disease with a 7 mm x 120 mm Eluvia postdilated with a 6 mm Mustang.  Widely patent SFA stent at completion.  Wires and catheters were removed.  A short 7 French sheath was placed in the right groin.  A mynx closure device was deployed after an access shot was obtained.  Plan: Patient  is now optimized after left SFA stent and left anterior tibial angioplasty.  Has inline flow down the left lower extremity with no significant residual stenosis and three-vessel runoff.  Will load on Plavix.  On aspirin statin as well.    Marty Heck, MD [facility] Office: [phone]   - pertinent xrays, CT, MRI studies were reviewed and independently interpreted  Positive ROS: All other systems have been reviewed and were otherwise negative with the exception of those mentioned in the HPI and as above.  Physical Exam: General: Alert, no acute distress Psychiatric: Patient is competent for consent with normal mood and affect Lymphatic: No axillary or cervical lymphadenopathy Cardiovascular: No pedal edema Respiratory: No cyanosis, no use of accessory musculature GI: No organomegaly, abdomen is soft and non-tender    Images:  @ENCIMAGES @  Labs:  Lab Results  Component Value Date   HGBA1C 7.4 (H) 10/18/2022   HGBA1C 8.0 (H) 11/13/2021   HGBA1C 8.2 (H) 05/14/2021   ESRSEDRATE 118 (H) 11/28/2022   ESRSEDRATE 68 (H) 11/13/2021   CRP 17.0 (H) 11/28/2022   CRP 3.5 (H) 11/05/2022   CRP 6.9 11/13/2021   LABURIC 7.1 07/17/2019   LABURIC 9.7 (H) 06/23/2019   LABURIC 9.9 (H) 02/20/2019   REPTSTATUS 12/02/2022 FINAL 11/27/2022   GRAMSTAIN  02/20/2019    CYTOSPIN SMEAR NO ORGANISMS SEEN WBC PRESENT, PREDOMINANTLY PMN Performed at [facility], [address] [place], [place] [address]    CULT  11/27/2022    NO GROWTH 5 DAYS Performed at [facility], [address] [place], [place] [address]     Lab Results  Component Value Date   ALBUMIN 2.7 (L) 12/02/2022   ALBUMIN 2.8 (L) 12/01/2022   ALBUMIN 2.8 (L) 11/30/2022   PREALBUMIN 22 11/28/2022   LABURIC 7.1 07/17/2019   LABURIC 9.7 (H) 06/23/2019   LABURIC 9.9 (H) 02/20/2019        Latest Ref Rng & Units 12/02/2022    2:56 AM 12/01/2022    2:21 AM 11/30/2022    2:32 AM  CBC EXTENDED   WBC 4.0 - 10.5 K/uL 8.2  8.2  6.9   RBC 4.22 - 5.81 MIL/uL 3.40  3.41  3.11   Hemoglobin 13.0 - 17.0 g/dL 9.6  9.7  8.8   HCT 39.0 - 52.0 % 29.5  29.5  27.6   Platelets 150 - 400 K/uL 364  323  269   NEUT# 1.7 - 7.7 K/uL 4.7  4.4  3.3   Lymph# 0.7 - 4.0 K/uL 2.0  2.2  1.7     Neurologic: Patient does not have protective sensation bilateral lower extremities.   MUSCULOSKELETAL:   Skin: Examination patient has necrotic changes to the left second toe with exposed bone.  Patient is status post revascularization to the left lower extremity is a strong palpable dorsalis pedis pulse.  Hemoglobin 9.6 white blood cell count 8.2 with an albumin of 2.7 and hemoglobin A1c of 7.4.  There is no ascending cellulitis.  Assessment: Assessment: Type 2 diabetes with end-stage renal disease on dialysis with peripheral vascular  disease status post revascularization to the left lower extremity with abscess ulceration osteomyelitis of the left second toe.  Plan: Plan: Will plan for left second ray amputation on Wednesday.  Risks and benefits were discussed including risk of the wound not healing need for additional surgery.  Patient states he understands wished to proceed at this time.  Thank you for the consult and the opportunity to see Mr. Joslyn Hy, MD [place] [phone] 11:40 AM

## 2022-12-02 NOTE — Progress Notes (Signed)
[facility] Progress Note   Subjective:  Seen on HD - 3.5L UFG and tolerating. Looks like will be going for toe amputation tomorrow. No CP/dyspnea. Leg feels a little better today.  Objective Vitals:   12/02/22 0802 12/02/22 0830 12/02/22 0900 12/02/22 0930  BP: (!) 179/101 (!) 144/92 (!) 157/94 (!) 173/99  Pulse: 90 96 96 93  Resp: 11 12 12 15   Temp:      TempSrc:      SpO2: 94% 96% 94% 94%  Weight:      Height:       Physical Exam General: Well appearing man, NAD Heart: RRR; no murmur Lungs: CTA anteriorly Abdomen: soft Extremities: L 2nd toe bandaged/not examined today, trace BLE edema Dialysis Access: LUE AVF  Additional Objective Labs: Basic Metabolic Panel: Recent Labs  Lab 11/30/22 0232 12/01/22 0221 12/02/22 0256  NA 133* 132* 130*  K 3.8 4.1 4.4  CL 92* 91* 90*  CO2 25 26 23   GLUCOSE 141* 164* 145*  BUN 59* 94* 110*  CREATININE 9.13* 10.84* 11.94*  CALCIUM 8.4* 8.7* 8.5*  PHOS 4.8* 5.5* 6.2*   Liver Function Tests: Recent Labs  Lab 11/26/22 1851 11/27/22 1200 11/28/22 0448 11/30/22 0232 12/01/22 0221 12/02/22 0256  AST 19 24  --   --   --   --   ALT 17 21  --   --   --   --   ALKPHOS 73 60  --   --   --   --   BILITOT 0.6 0.7  --   --   --   --   PROT 7.6 7.3  --   --   --   --   ALBUMIN 2.9* 2.9*   < > 2.8* 2.8* 2.7*   < > = values in this interval not displayed.   CBC: Recent Labs  Lab 11/28/22 0831 11/28/22 2310 11/30/22 0232 12/01/22 0221 12/02/22 0256  WBC 7.1 7.9 6.9 8.2 8.2  NEUTROABS 3.7 4.3 3.3 4.4 4.7  HGB 8.7* 9.8* 8.8* 9.7* 9.6*  HCT 27.5* 30.9* 27.6* 29.5* 29.5*  MCV 89.3 88.8 88.7 86.5 86.8  PLT 221 233 269 323 364   Blood Culture    Component Value Date/Time   SDES BLOOD vline 11/27/2022 1358   SPECREQUEST  11/27/2022 1358    BOTTLES DRAWN AEROBIC AND ANAEROBIC Blood Culture adequate volume   CULT  11/27/2022 1358    NO GROWTH 5 DAYS Performed at [facility], [address], [place],  [place] [address]    REPTSTATUS 12/02/2022 FINAL 11/27/2022 1358   Studies/Results: PERIPHERAL VASCULAR CATHETERIZATION  Result Date: 12/01/2022 Images from the original result were not included.   Patient name: Kelly Key   MRN: [id number]        DOB: 1970-11-21            Sex: male  12/01/2022 Pre-operative Diagnosis: Critical limb ischemia of the left lower extremity with tissue loss Post-operative diagnosis:  Same Surgeon:  Marty Heck, MD Procedure Performed: 1.  Ultrasound-guided access right common femoral artery 2.  Aortogram with catheter selection of aorta 3.  Left lower extremity arteriogram with selection of third order branches 4.  Left anterior tibial artery angioplasty (3 mm x 220 mm Sterling) 5.  Angioplasty and stent of left SFA (7 mm x 120 mm drug-coated Eluvia postdilated with a 6 mm Mustang) 6.  Mynx closure of the right common femoral artery 7.  67 minutes of monitored moderate  conscious sedation time  Indications: 52 year old male seen in consultation over the weekend by my partner Dr. Virl Cagey with critical limb ischemia and tissue loss in the left lower extremity.  He has multiple risk factors including end-stage renal disease and diabetes.  He presents today for aortogram, lower extremity arteriogram, with possible invention with a focus on the left leg after risks and benefits discussed.  Findings:  Aortogram showed no significant aortoiliac stenosis.  The renal arteries were poorly visualized.  Left lower extremity arteriogram showed a patent common femoral and profunda.  The SFA was patent but had two focal areas of stenosis approximately 60% each with diffuse disease adjacent to these segments.  The popliteal artery was patent.  The runoff below the knee was in the posterior tibial and peroneal that were widely patent.  The anterior tibial was diffusely diseased with a high-grade stenosis throughout the entire artery from the takeoff of the anterior tibial to just above the ankle.   Ultimately we were able to get down the anterior tibial antegrade from contralateral groin access and this was angioplastied with a 3 mm x 220 mm Sterling with excellent results and he now has three-vessel runoff below the knee.  The left SFA was stented with a 7 mm x 120 mm Eluvia postdilated with a 6 mm Mustang with no residual stenosis.  Has inline flow with no significant stenosis after intervention.             Procedure:  The patient was identified in the holding area and taken to room 8.  The patient was then placed supine on the table and prepped and draped in the usual sterile fashion.  A time out was called.  Patient received Versed and fentanyl for conscious moderate sedation.  Vital signs were monitored including heart rate, respiratory rate, oxygenation and blood pressure.  I was present for all of moderate sedation.  Ultrasound was used to evaluate the right common femoral artery.  It was patent .  A digital ultrasound image was acquired.  A micropuncture needle was used to access the right common femoral artery under ultrasound guidance.  An 018 wire was advanced without resistance and a micropuncture sheath was placed.  The 018 wire was removed and a benson wire was placed.  The micropuncture sheath was exchanged for a 5 french sheath.  An omniflush catheter was advanced over the wire to the level of L-1.  An abdominal angiogram was obtained.  Next, using the omniflush catheter and a benson wire, the aortic bifurcation was crossed and the catheter was placed into theleft external iliac artery and left runoff was obtained.  After evaluating images elected for left leg intervention.  A Glidewire advantage was placed in the left SFA and we placed a long 7 French sheath in the right groin over the aortic bifurcation.  Patient was given 100 units/kg IV heparin.  I then exchanged for a V18 wire and was able to cannulate the anterior tibial antegrade from contralateral groin access and get all the way down  the vessel and crossed the diseased segment into the dorsalis pedis of the foot.  I then confirmed with hand-injection I was in the true lumen distally.  The entire anterior tibial was then angioplastied with a 3 mm x 220 mm Sterling to nominal pressure for 2 minutes throughout the segment.  He now has a widely patent anterior tibial.  I then went up to the SFA and got a planning shot and elected to  treat the SFA disease with a 7 mm x 120 mm Eluvia postdilated with a 6 mm Mustang.  Widely patent SFA stent at completion.  Wires and catheters were removed.  A short 7 French sheath was placed in the right groin.  A mynx closure device was deployed after an access shot was obtained.  Plan: Patient is now optimized after left SFA stent and left anterior tibial angioplasty.  Has inline flow down the left lower extremity with no significant residual stenosis and three-vessel runoff.  Will load on Plavix.  On aspirin statin as well.    Marty Heck, MD [facility] Office: [phone]   Medications:  sodium chloride     cefTAZidime (FORTAZ)  IV Stopped (11/29/22 1940)   vancomycin      (feeding supplement) PROSource Plus  30 mL Oral TID WC   ascorbic acid  500 mg Oral Daily   aspirin EC  81 mg Oral Daily   atorvastatin  80 mg Oral Daily   calcitRIOL  2.5 mcg Oral Q T,Th,Sa-HD   carvedilol  12.5 mg Oral BID WC   Chlorhexidine Gluconate Cloth  6 each Topical Q0600   Chlorhexidine Gluconate Cloth  6 each Topical Q0600   cloNIDine  0.1 mg Oral BID   clopidogrel  75 mg Oral Daily   ezetimibe  10 mg Oral QPM   feeding supplement (NEPRO CARB STEADY)  237 mL Oral BID BM   gabapentin  100 mg Oral TID   guaiFENesin-dextromethorphan  10 mL Oral Q8H   heparin  5,000 Units Subcutaneous Q8H   insulin aspart  0-15 Units Subcutaneous TID WC   insulin aspart  0-5 Units Subcutaneous QHS   insulin detemir  40 Units Subcutaneous QHS   ipratropium-albuterol  3 mL Nebulization TID    metroNIDAZOLE  500 mg Oral Q12H   multivitamin  1 tablet Oral QHS   nutrition supplement (JUVEN)  1 packet Oral BID BM   pantoprazole  40 mg Oral Daily   sevelamer carbonate  2,400 mg Oral TID WC   sodium chloride flush  3 mL Intravenous Q12H   zinc sulfate  220 mg Oral Daily    Dialysis Orders: [facility] TTS 4:15hr, 450/500, EDW 138.5kg, 3K/2.5Ca, heparin 2600u loading + 1000u/hr - Mircera 30 mcg (last given on 11/25/22) - Venofer 50 mg weekly (last given on 11/25/22) - Calcitriol 2.5 mcg three days a week   Problem/Plan: ESRD: Continue HD on usual TTS schedule - HD now, 3.5L UFG and tolerating. Left second toe gangrene: S/p angioplasty to L ATA and angioplasty/stent to L SFA on 2/5, for 2nd toe amputation tomorrow. Ortho and VVS following. Remains on IV Vanc/Ceftaz + PO metronidazole. Acute hypoxic respiratory failure: Mild overload + recent Hx COVID: Challenge EDW as tolerated, below prior EDW now - needs to be lowered on d/c. Hx COVID pneumonia: Off isolation at this time. Hypertension/volume: BP slightly high. Prev on clonidine 0.3mg  TID -> now 0.1mg  TID, prev on Coreg 25mg  BID -> now 12.5mg  BID and off amlodipine. Follow and fluid removal with HD. Anemia of ESRD: Hgb up to 9.7, Mirera last given 11/25/21 - not due yet.  Secondary hyperparathyroidism: Ca/Phos ok - continue home VDRA + binders (sevelamer) Nutrition: Alb low, continue protein supplements T2DM: Per primary.  Veneta Penton, PA-C 12/02/2022, 10:25 AM  [employer]

## 2022-12-02 NOTE — Progress Notes (Signed)
Progress Note    Kelly Key   ZOX:[id number]  DOB: Jan 06, 1971  DOA: 11/26/2022     5 PCP: [name], MD  Initial CC: Left foot infection  Hospital Course: Kelly Key is a 52 y.o. male with PMH chronic dCHF, COPD, ESRD on HD, DMII, hepatic steatosis, HLD, and more who presented with dyspnea.  Patient reports that he has had some level of dyspnea since he was diagnosed with COVID.  He also began developing fevers at home and a mild nonproductive cough. He also has had an ongoing wound involving his left foot second digit. Exam was concerning for toe gangrene.  He was admitted for IV antibiotics and evaluation with orthopedic surgery and vascular surgery.  Interval History:  No events overnight. Seen by Dr. Sharol Given today; plan is for left 2nd toe amputation tomorrow.  Underwent HD today with no issues.  Assessment and Plan: * Gangrene of toe of left foot [facility]) - Patient says he has been followed for this by his podiatrist Dr. Irving Shows in [place] is purulent and foul-smelling -Also has old healed chronic wound involving right foot third digit -Continue antibiotics as per ID (vancomycin, ceftazidime, Flagyl); suspect we can discontinue shortly after surgery - Vascular and orthopedic surgery following -Underwent angiogram 2/5 with angioplasty of left anterior tibial artery and angioplasty plus stent of left SFA - Next plan is left 2nd digit amputation with Dr. Sharol Given on 12/03/22  Osteomyelitis of second toe of left foot ([place]) - Suspected due to underlying PAD - continue abx for now -Plan is for left foot second digit amputation on 12/03/2022 with Dr. Sharol Given  PAD (peripheral artery disease) [facility]) -Underwent angiogram 12/01/22 with angioplasty of left anterior tibial artery and angioplasty plus stent of left SFA - continue asa and statin  ESRD (end stage renal disease) [facility]) - Nephrology arranging hemodialysis - Pt is on schedule with TTS HD treatments  Fever,  unspecified-resolved as of 11/29/2022 - Blood cultures negative so far, continue following - Suspected due to setting of toe gangrene  Acute respiratory failure with hypoxia (HCC)-resolved as of 12/01/2022 - Likely related to COVID-19 and possibly some fluid overload - Chest x-ray shows pulmonary edema and pleural effusions of improved.  Mild residual vascular congestion.  Mild cardiomegaly.  Mild bibasilar atelectasis. - Patient has been COVID-positive since January 9 - no isolation needed per Dr. Baxter Flattery -Remains on room air  History of COVID-19-resolved as of 11/29/2022 - Initially positive on 11/04/2022 -No indication for treatment or isolation as per Dr. Baxter Flattery   GERD (gastroesophageal reflux disease) - Continue Protonix  Diabetes mellitus type 2 in obese ([place]) - A1c 7.4% on 10/18/22 - continue Levemir - Continue SSI and CBG monitoring  Chronic combined systolic and diastolic CHF (congestive heart failure) (HCC) - Continue aspirin, statin, Coreg, home dose of Lasix - Not clinically in exacerbation - Volume management with hemodialysis   Hyperkalemia-resolved as of 11/29/2022 - Treated with hemodialysis and resolved now   Old records reviewed in assessment of this patient  Antimicrobials: Rocephin 11/28/2022 x 1 Ceftazidime 11/28/2022 >> current Flagyl 11/28/2022 >> current Vancomycin 11/28/2022 >> current  DVT prophylaxis:  heparin injection 5,000 Units Start: 11/27/22 0600 SCDs Start: 11/26/22 2323   Code Status:   Code Status: Full Code  Mobility Assessment (last 72 hours)     Mobility Assessment     Row Name 12/01/22 1329 11/30/22 2030 11/30/22 0900 11/29/22 2140     Does patient have an order for  bedrest or is patient medically unstable No - Continue assessment No - Continue assessment No - Continue assessment No - Continue assessment    What is the highest level of mobility based on the progressive mobility assessment? Level 6 (Walks independently in room and hall) -  Balance while walking in room without assist - Complete Level 6 (Walks independently in room and hall) - Balance while walking in room without assist - Complete Level 6 (Walks independently in room and hall) - Balance while walking in room without assist - Complete Level 5 (Walks with assist in room/hall) - Balance while stepping forward/back and can walk in room with assist - Complete             Barriers to discharge:  Disposition Plan: Pending clinical course, likely home Status is: Inpatient  Objective: Blood pressure (!) 147/93, pulse 99, temperature 98 F (36.7 C), temperature source Oral, resp. rate 18, height 5\' 6"  (1.676 m), weight (!) 136.1 kg, SpO2 98 %.  Examination:  Physical Exam Constitutional:      General: He is not in acute distress.    Appearance: Normal appearance.  HENT:     Head: Normocephalic and atraumatic.     Mouth/Throat:     Mouth: Mucous membranes are moist.  Eyes:     Extraocular Movements: Extraocular movements intact.  Cardiovascular:     Rate and Rhythm: Normal rate and regular rhythm.     Heart sounds: Normal heart sounds.  Pulmonary:     Effort: Pulmonary effort is normal. No respiratory distress.     Breath sounds: Normal breath sounds. No wheezing.  Abdominal:     General: Bowel sounds are normal. There is no distension.     Palpations: Abdomen is soft.     Tenderness: There is no abdominal tenderness.  Musculoskeletal:     Cervical back: Normal range of motion and neck supple.     Comments: Left foot 2nd digit noted to be consistent with gangrene with foul odor and purulent drainage noted; no TTP  Skin:    General: Skin is warm and dry.  Neurological:     General: No focal deficit present.     Mental Status: He is alert.  Psychiatric:        Mood and Affect: Mood normal.        Behavior: Behavior normal.      Consultants:  Infectious disease Vascular surgery Orthopedic surgery Nephrology  Procedures:  12/01/22:  Procedure  Performed: 1.  Ultrasound-guided access right common femoral artery 2.  Aortogram with catheter selection of aorta 3.  Left lower extremity arteriogram with selection of third order branches 4.  Left anterior tibial artery angioplasty (3 mm x 220 mm Sterling) 5.  Angioplasty and stent of left SFA (7 mm x 120 mm drug-coated Eluvia postdilated with a 6 mm Mustang) 6.  Mynx closure of the right common femoral artery 7.  67 minutes of monitored moderate conscious sedation time  Data Reviewed: Results for orders placed or performed during the hospital encounter of 11/26/22 (from the past 24 hour(s))  Glucose, capillary     Status: Abnormal   Collection Time: 12/01/22  1:36 PM  Result Value Ref Range   Glucose-Capillary 248 (H) 70 - 99 mg/dL  Glucose, capillary     Status: Abnormal   Collection Time: 12/01/22  4:40 PM  Result Value Ref Range   Glucose-Capillary 200 (H) 70 - 99 mg/dL  Glucose, capillary     Status: Abnormal  Collection Time: 12/01/22  9:17 PM  Result Value Ref Range   Glucose-Capillary 166 (H) 70 - 99 mg/dL  Renal function panel     Status: Abnormal   Collection Time: 12/02/22  2:56 AM  Result Value Ref Range   Sodium 130 (L) 135 - 145 mmol/L   Potassium 4.4 3.5 - 5.1 mmol/L   Chloride 90 (L) 98 - 111 mmol/L   CO2 23 22 - 32 mmol/L   Glucose, Bld 145 (H) 70 - 99 mg/dL   BUN 110 (H) 6 - 20 mg/dL   Creatinine, Ser 11.94 (H) 0.61 - 1.24 mg/dL   Calcium 8.5 (L) 8.9 - 10.3 mg/dL   Phosphorus 6.2 (H) 2.5 - 4.6 mg/dL   Albumin 2.7 (L) 3.5 - 5.0 g/dL   GFR, Estimated 5 (L) >60 mL/min   Anion gap 17 (H) 5 - 15  CBC with Differential/Platelet     Status: Abnormal   Collection Time: 12/02/22  2:56 AM  Result Value Ref Range   WBC 8.2 4.0 - 10.5 K/uL   RBC 3.40 (L) 4.22 - 5.81 MIL/uL   Hemoglobin 9.6 (L) 13.0 - 17.0 g/dL   HCT 29.5 (L) 39.0 - 52.0 %   MCV 86.8 80.0 - 100.0 fL   MCH 28.2 26.0 - 34.0 pg   MCHC 32.5 30.0 - 36.0 g/dL   RDW 14.3 11.5 - 15.5 %   Platelets  364 150 - 400 K/uL   nRBC 0.0 0.0 - 0.2 %   Neutrophils Relative % 58 %   Neutro Abs 4.7 1.7 - 7.7 K/uL   Lymphocytes Relative 24 %   Lymphs Abs 2.0 0.7 - 4.0 K/uL   Monocytes Relative 11 %   Monocytes Absolute 0.9 0.1 - 1.0 K/uL   Eosinophils Relative 6 %   Eosinophils Absolute 0.5 0.0 - 0.5 K/uL   Basophils Relative 1 %   Basophils Absolute 0.1 0.0 - 0.1 K/uL   Immature Granulocytes 0 %   Abs Immature Granulocytes 0.03 0.00 - 0.07 K/uL  Magnesium     Status: None   Collection Time: 12/02/22  2:56 AM  Result Value Ref Range   Magnesium 2.3 1.7 - 2.4 mg/dL  Glucose, capillary     Status: Abnormal   Collection Time: 12/02/22  7:36 AM  Result Value Ref Range   Glucose-Capillary 127 (H) 70 - 99 mg/dL  Glucose, capillary     Status: Abnormal   Collection Time: 12/02/22  1:05 PM  Result Value Ref Range   Glucose-Capillary 141 (H) 70 - 99 mg/dL    I have reviewed pertinent nursing notes, vitals, labs, and images as necessary. I have ordered labwork to follow up on as indicated.  I have reviewed the last notes from staff over past 24 hours. I have discussed patient's care plan and test results with nursing staff, CM/SW, and other staff as appropriate.  Time spent: Greater than 50% of the 55 minute visit was spent in counseling/coordination of care for the patient as laid out in the A&P.   LOS: 5 days   Dwyane Dee, MD [facility] 12/02/2022, 1:17 PM

## 2022-12-03 ENCOUNTER — Other Ambulatory Visit: Payer: Self-pay

## 2022-12-03 ENCOUNTER — Inpatient Hospital Stay (HOSPITAL_COMMUNITY): Payer: 59 | Admitting: Anesthesiology

## 2022-12-03 ENCOUNTER — Encounter (HOSPITAL_COMMUNITY): Payer: Self-pay | Admitting: Family Medicine

## 2022-12-03 ENCOUNTER — Encounter (HOSPITAL_COMMUNITY)
Admission: EM | Disposition: A | Discharge status: Home Health | Payer: Self-pay | Source: Home / Self Care | Attending: Internal Medicine

## 2022-12-03 DIAGNOSIS — I96 Gangrene, not elsewhere classified: Secondary | ICD-10-CM | POA: Diagnosis not present

## 2022-12-03 DIAGNOSIS — E1165 Type 2 diabetes mellitus with hyperglycemia: Secondary | ICD-10-CM | POA: Diagnosis not present

## 2022-12-03 DIAGNOSIS — Z794 Long term (current) use of insulin: Secondary | ICD-10-CM | POA: Diagnosis not present

## 2022-12-03 DIAGNOSIS — J449 Chronic obstructive pulmonary disease, unspecified: Secondary | ICD-10-CM | POA: Diagnosis not present

## 2022-12-03 DIAGNOSIS — E1152 Type 2 diabetes mellitus with diabetic peripheral angiopathy with gangrene: Secondary | ICD-10-CM | POA: Diagnosis not present

## 2022-12-03 HISTORY — PX: AMPUTATION: SHX166

## 2022-12-03 LAB — GLUCOSE, CAPILLARY
Glucose-Capillary: 181 mg/dL — ABNORMAL HIGH (ref 70–99)
Glucose-Capillary: 164 mg/dL — ABNORMAL HIGH (ref 70–99)
Glucose-Capillary: 301 mg/dL — ABNORMAL HIGH (ref 70–99)
Glucose-Capillary: 295 mg/dL — ABNORMAL HIGH (ref 70–99)
Glucose-Capillary: 385 mg/dL — ABNORMAL HIGH (ref 70–99)

## 2022-12-03 LAB — RENAL FUNCTION PANEL
Albumin: 2.7 g/dL — ABNORMAL LOW (ref 3.5–5.0)
Anion gap: 17 — ABNORMAL HIGH (ref 5–15)
BUN: 87 mg/dL — ABNORMAL HIGH (ref 6–20)
CO2: 25 mmol/L (ref 22–32)
Calcium: 8.7 mg/dL — ABNORMAL LOW (ref 8.9–10.3)
Chloride: 89 mmol/L — ABNORMAL LOW (ref 98–111)
Creatinine, Ser: 10.01 mg/dL — ABNORMAL HIGH (ref 0.61–1.24)
GFR, Estimated: 6 mL/min — ABNORMAL LOW (ref >60)
Glucose, Bld: 182 mg/dL — ABNORMAL HIGH (ref 70–99)
Phosphorus: 4.7 mg/dL — ABNORMAL HIGH (ref 2.5–4.6)
Potassium: 4.6 mmol/L (ref 3.5–5.1)
Sodium: 131 mmol/L — ABNORMAL LOW (ref 135–145)

## 2022-12-03 LAB — CBC WITH DIFFERENTIAL/PLATELET
Abs Immature Granulocytes: 0.07 10*3/uL (ref 0.00–0.07)
Basophils Absolute: 0.1 10*3/uL (ref 0.0–0.1)
Basophils Relative: 1 %
Eosinophils Absolute: 0.5 10*3/uL (ref 0.0–0.5)
Eosinophils Relative: 5 %
HCT: 29.9 % — ABNORMAL LOW (ref 39.0–52.0)
Hemoglobin: 9.8 g/dL — ABNORMAL LOW (ref 13.0–17.0)
Immature Granulocytes: 1 %
Lymphocytes Relative: 22 %
Lymphs Abs: 2.1 10*3/uL (ref 0.7–4.0)
MCH: 28.5 pg (ref 26.0–34.0)
MCHC: 32.8 g/dL (ref 30.0–36.0)
MCV: 86.9 fL (ref 80.0–100.0)
Monocytes Absolute: 1 10*3/uL (ref 0.1–1.0)
Monocytes Relative: 10 %
Neutro Abs: 6 10*3/uL (ref 1.7–7.7)
Neutrophils Relative %: 61 %
Platelets: 367 10*3/uL (ref 150–400)
RBC: 3.44 MIL/uL — ABNORMAL LOW (ref 4.22–5.81)
RDW: 14.4 % (ref 11.5–15.5)
WBC: 9.7 10*3/uL (ref 4.0–10.5)
nRBC: 0 % (ref 0.0–0.2)

## 2022-12-03 LAB — SURGICAL PCR SCREEN
MRSA, PCR: POSITIVE — AB
Staphylococcus aureus: POSITIVE — AB

## 2022-12-03 LAB — MAGNESIUM: Magnesium: 2.1 mg/dL (ref 1.7–2.4)

## 2022-12-03 SURGERY — AMPUTATION DIGIT
Anesthesia: Regional | Site: Second Toe | Laterality: Left

## 2022-12-03 MED ORDER — 0.9 % SODIUM CHLORIDE (POUR BTL) OPTIME
TOPICAL | Status: DC | PRN
  Administered 2022-12-03: 1000 mL

## 2022-12-03 MED ORDER — METOPROLOL TARTRATE 5 MG/5ML IV SOLN: 2.0000 mg | INTRAVENOUS | Status: DC | PRN

## 2022-12-03 MED ORDER — ZINC SULFATE 220 (50 ZN) MG PO CAPS: 220.0000 mg | ORAL_CAPSULE | Freq: Every day | ORAL | Status: DC

## 2022-12-03 MED ORDER — ONDANSETRON HCL 4 MG/2ML IJ SOLN: 4.0000 mg | Freq: Four times a day (QID) | INTRAMUSCULAR | Status: DC | PRN

## 2022-12-03 MED ORDER — POLYETHYLENE GLYCOL 3350 17 G PO PACK: 17.0000 g | PACK | Freq: Every day | ORAL | Status: DC | PRN

## 2022-12-03 MED ORDER — ONDANSETRON HCL 4 MG/2ML IJ SOLN: 4.0000 mg | Freq: Once | INTRAMUSCULAR | Status: DC | PRN

## 2022-12-03 MED ORDER — HYDROMORPHONE HCL 1 MG/ML IJ SOLN: 0.5000 mg | INTRAMUSCULAR | Status: DC | PRN

## 2022-12-03 MED ORDER — INSULIN ASPART 100 UNIT/ML IJ SOLN
0.0000 [IU] | INTRAMUSCULAR | Status: DC | PRN
  Administered 2022-12-03: 2 [IU] via SUBCUTANEOUS
  Filled 2022-12-03: qty 1

## 2022-12-03 MED ORDER — VITAMIN C 500 MG PO TABS
1000.0000 mg | ORAL_TABLET | Freq: Every day | ORAL | Status: DC
  Administered 2022-12-03: 1000 mg via ORAL
  Filled 2022-12-03: qty 2

## 2022-12-03 MED ORDER — BISACODYL 5 MG PO TBEC: 5.0000 mg | DELAYED_RELEASE_TABLET | Freq: Every day | ORAL | Status: DC | PRN

## 2022-12-03 MED ORDER — PROPOFOL 10 MG/ML IV BOLUS
INTRAVENOUS | Status: DC | PRN
  Administered 2022-12-03: 20 mg via INTRAVENOUS
  Administered 2022-12-03 (×3): 10 mg via INTRAVENOUS
  Administered 2022-12-03 (×2): 20 mg via INTRAVENOUS
  Administered 2022-12-03: 10 mg via INTRAVENOUS

## 2022-12-03 MED ORDER — DEXAMETHASONE SODIUM PHOSPHATE 10 MG/ML IJ SOLN
INTRAMUSCULAR | Status: DC | PRN
  Administered 2022-12-03: 10 mg

## 2022-12-03 MED ORDER — PANTOPRAZOLE SODIUM 40 MG PO TBEC: 40.0000 mg | DELAYED_RELEASE_TABLET | Freq: Every day | ORAL | Status: DC

## 2022-12-03 MED ORDER — POTASSIUM CHLORIDE CRYS ER 20 MEQ PO TBCR: 20.0000 meq | EXTENDED_RELEASE_TABLET | Freq: Every day | ORAL | Status: DC | PRN

## 2022-12-03 MED ORDER — FENTANYL CITRATE (PF) 100 MCG/2ML IJ SOLN
INTRAMUSCULAR | Status: DC | PRN
  Administered 2022-12-03: 50 ug via INTRAVENOUS

## 2022-12-03 MED ORDER — MAGNESIUM CITRATE PO SOLN: 1.0000 | Freq: Once | ORAL | Status: DC | PRN

## 2022-12-03 MED ORDER — JUVEN PO PACK: 1.0000 | PACK | Freq: Two times a day (BID) | ORAL | Status: DC

## 2022-12-03 MED ORDER — HYDRALAZINE HCL 20 MG/ML IJ SOLN: 5.0000 mg | INTRAMUSCULAR | Status: DC | PRN

## 2022-12-03 MED ORDER — GUAIFENESIN-DM 100-10 MG/5ML PO SYRP
15.0000 mL | ORAL_SOLUTION | ORAL | Status: DC | PRN
  Administered 2022-12-04: 15 mL via ORAL
  Filled 2022-12-03: qty 15

## 2022-12-03 MED ORDER — OXYCODONE HCL 5 MG PO TABS
5.0000 mg | ORAL_TABLET | ORAL | Status: DC | PRN
  Administered 2022-12-04: 10 mg via ORAL
  Filled 2022-12-03: qty 2

## 2022-12-03 MED ORDER — LIDOCAINE 2% (20 MG/ML) 5 ML SYRINGE
INTRAMUSCULAR | Status: AC
  Filled 2022-12-03: qty 5

## 2022-12-03 MED ORDER — MAGNESIUM SULFATE 2 GM/50ML IV SOLN: 2.0000 g | Freq: Every day | INTRAVENOUS | Status: DC | PRN

## 2022-12-03 MED ORDER — ACETAMINOPHEN 325 MG PO TABS
325.0000 mg | ORAL_TABLET | Freq: Four times a day (QID) | ORAL | Status: DC | PRN
  Administered 2022-12-04: 650 mg via ORAL
  Filled 2022-12-03: qty 2

## 2022-12-03 MED ORDER — OXYCODONE HCL 5 MG PO TABS
10.0000 mg | ORAL_TABLET | ORAL | Status: DC | PRN
  Administered 2022-12-05: 15 mg via ORAL
  Filled 2022-12-03: qty 3

## 2022-12-03 MED ORDER — CEFAZOLIN SODIUM-DEXTROSE 2-4 GM/100ML-% IV SOLN
2.0000 g | Freq: Three times a day (TID) | INTRAVENOUS | Status: AC
  Administered 2022-12-03: 2 g via INTRAVENOUS
  Filled 2022-12-03: qty 100

## 2022-12-03 MED ORDER — FENTANYL CITRATE (PF) 100 MCG/2ML IJ SOLN
INTRAMUSCULAR | Status: AC
  Filled 2022-12-03: qty 2

## 2022-12-03 MED ORDER — MIDAZOLAM HCL 2 MG/2ML IJ SOLN
INTRAMUSCULAR | Status: AC
  Filled 2022-12-03: qty 2

## 2022-12-03 MED ORDER — PROPOFOL 10 MG/ML IV BOLUS
INTRAVENOUS | Status: AC
  Filled 2022-12-03: qty 20

## 2022-12-03 MED ORDER — MIDAZOLAM HCL 5 MG/5ML IJ SOLN
INTRAMUSCULAR | Status: DC | PRN
  Administered 2022-12-03: 1 mg via INTRAVENOUS

## 2022-12-03 MED ORDER — PHENOL 1.4 % MT LIQD: 1.0000 | OROMUCOSAL | Status: DC | PRN

## 2022-12-03 MED ORDER — SODIUM CHLORIDE 0.9 % IV SOLN: INTRAVENOUS | Status: DC

## 2022-12-03 MED ORDER — LABETALOL HCL 5 MG/ML IV SOLN: 10.0000 mg | INTRAVENOUS | Status: DC | PRN

## 2022-12-03 MED ORDER — DOCUSATE SODIUM 100 MG PO CAPS
100.0000 mg | ORAL_CAPSULE | Freq: Every day | ORAL | Status: DC
  Administered 2022-12-04 – 2022-12-05 (×2): 100 mg via ORAL
  Filled 2022-12-03 (×4): qty 1

## 2022-12-03 MED ORDER — ROPIVACAINE HCL 5 MG/ML IJ SOLN
INTRAMUSCULAR | Status: DC | PRN
  Administered 2022-12-03: 30 mL via PERINEURAL

## 2022-12-03 MED ORDER — ALUM & MAG HYDROXIDE-SIMETH 200-200-20 MG/5ML PO SUSP: 15.0000 mL | ORAL | Status: DC | PRN

## 2022-12-03 MED ORDER — FENTANYL CITRATE (PF) 100 MCG/2ML IJ SOLN: 25.0000 ug | INTRAMUSCULAR | Status: DC | PRN

## 2022-12-03 SURGICAL SUPPLY — 33 items
BAG COUNTER SPONGE SURGICOUNT (BAG) ×1 IMPLANT
BAG SPNG CNTER NS LX DISP (BAG) ×1
BLADE SURG 21 STRL SS (BLADE) ×1 IMPLANT
BNDG CMPR 9X4 STRL LF SNTH (GAUZE/BANDAGES/DRESSINGS)
BNDG COHESIVE 4X5 TAN STRL (GAUZE/BANDAGES/DRESSINGS) ×1 IMPLANT
BNDG COHESIVE 6X5 TAN NS LF (GAUZE/BANDAGES/DRESSINGS) IMPLANT
BNDG ESMARK 4X9 LF (GAUZE/BANDAGES/DRESSINGS) IMPLANT
BNDG GAUZE DERMACEA FLUFF 4 (GAUZE/BANDAGES/DRESSINGS) ×1 IMPLANT
BNDG GZE DERMACEA 4 6PLY (GAUZE/BANDAGES/DRESSINGS) ×1
COVER SURGICAL LIGHT HANDLE (MISCELLANEOUS) ×2 IMPLANT
DRAPE U-SHAPE 47X51 STRL (DRAPES) ×1 IMPLANT
DRSG ADAPTIC 3X8 NADH LF (GAUZE/BANDAGES/DRESSINGS) IMPLANT
DURAPREP 26ML APPLICATOR (WOUND CARE) ×1 IMPLANT
ELECT REM PT RETURN 9FT ADLT (ELECTROSURGICAL) ×1
ELECTRODE REM PT RTRN 9FT ADLT (ELECTROSURGICAL) ×1 IMPLANT
GAUZE PAD ABD 8X10 STRL (GAUZE/BANDAGES/DRESSINGS) ×1 IMPLANT
GAUZE SPONGE 4X4 12PLY STRL (GAUZE/BANDAGES/DRESSINGS) IMPLANT
GLOVE BIOGEL PI IND STRL 9 (GLOVE) ×1 IMPLANT
GLOVE SURG ORTHO 9.0 STRL STRW (GLOVE) ×1 IMPLANT
GOWN STRL REUS W/ TWL XL LVL3 (GOWN DISPOSABLE) ×2 IMPLANT
GOWN STRL REUS W/TWL XL LVL3 (GOWN DISPOSABLE) ×2
GRAFT SKIN WND MICRO 38 (Tissue) IMPLANT
KIT BASIN OR (CUSTOM PROCEDURE TRAY) ×1 IMPLANT
KIT TURNOVER KIT B (KITS) ×1 IMPLANT
MANIFOLD NEPTUNE II (INSTRUMENTS) ×1 IMPLANT
NDL 22X1.5 STRL (OR ONLY) (MISCELLANEOUS) IMPLANT
NEEDLE 22X1.5 STRL (OR ONLY) (MISCELLANEOUS) IMPLANT
NS IRRIG 1000ML POUR BTL (IV SOLUTION) ×1 IMPLANT
PACK ORTHO EXTREMITY (CUSTOM PROCEDURE TRAY) ×1 IMPLANT
PAD ARMBOARD 7.5X6 YLW CONV (MISCELLANEOUS) ×2 IMPLANT
SUT ETHILON 2 0 PSLX (SUTURE) ×1 IMPLANT
SYR CONTROL 10ML LL (SYRINGE) IMPLANT
TOWEL GREEN STERILE (TOWEL DISPOSABLE) ×1 IMPLANT

## 2022-12-03 NOTE — Progress Notes (Signed)
[facility] Progress Note   Subjective:  Seen in room - s/p L 2nd ray amputation this AM. Pain controlled for now. No CP/dyspnea.  Objective Vitals:   12/03/22 0757 12/03/22 0900 12/03/22 0915 12/03/22 0930  BP: (!) 165/98 (!) 151/94 (!) 157/99 (!) 149/90  Pulse: 93 87 90 92  Resp: 16 14 12 16   Temp:  98.6 F (37 C) 97.7 F (36.5 C) (!) 97.5 F (36.4 C)  TempSrc:      SpO2: 94% 96% 94% 94%  Weight:      Height:       Physical Exam General: Well appearing man, NAD Heart: RRR; no murmur Lungs: CTA anteriorly Abdomen: soft Extremities: L foot bandaged, trace BLE edema Dialysis Access: LUE AVF  Additional Objective Labs: Basic Metabolic Panel: Recent Labs  Lab 12/01/22 0221 12/02/22 0256 12/03/22 0305  NA 132* 130* 131*  K 4.1 4.4 4.6  CL 91* 90* 89*  CO2 26 23 25   GLUCOSE 164* 145* 182*  BUN 94* 110* 87*  CREATININE 10.84* 11.94* 10.01*  CALCIUM 8.7* 8.5* 8.7*  PHOS 5.5* 6.2* 4.7*   Liver Function Tests: Recent Labs  Lab 11/26/22 1851 11/27/22 1200 11/28/22 0448 12/01/22 0221 12/02/22 0256 12/03/22 0305  AST 19 24  --   --   --   --   ALT 17 21  --   --   --   --   ALKPHOS 73 60  --   --   --   --   BILITOT 0.6 0.7  --   --   --   --   PROT 7.6 7.3  --   --   --   --   ALBUMIN 2.9* 2.9*   < > 2.8* 2.7* 2.7*   < > = values in this interval not displayed.   CBC: Recent Labs  Lab 11/28/22 2310 11/30/22 0232 12/01/22 0221 12/02/22 0256 12/03/22 0305  WBC 7.9 6.9 8.2 8.2 9.7  NEUTROABS 4.3 3.3 4.4 4.7 6.0  HGB 9.8* 8.8* 9.7* 9.6* 9.8*  HCT 30.9* 27.6* 29.5* 29.5* 29.9*  MCV 88.8 88.7 86.5 86.8 86.9  PLT 233 269 323 364 367   Blood Culture    Component Value Date/Time   SDES BLOOD vline 11/27/2022 1358   SPECREQUEST  11/27/2022 1358    BOTTLES DRAWN AEROBIC AND ANAEROBIC Blood Culture adequate volume   CULT  11/27/2022 1358    NO GROWTH 5 DAYS Performed at [facility], [address] [place], [place] [address]     REPTSTATUS 12/02/2022 FINAL 11/27/2022 1358   Medications:  sodium chloride     sodium chloride      ceFAZolin (ANCEF) IV     cefTAZidime (FORTAZ)  IV 1 g (12/02/22 1805)   magnesium sulfate bolus IVPB     vancomycin 1,000 mg (12/02/22 1633)    (feeding supplement) PROSource Plus  30 mL Oral TID WC   vitamin C  1,000 mg Oral Daily   aspirin EC  81 mg Oral Daily   atorvastatin  80 mg Oral Daily   calcitRIOL  2.5 mcg Oral Q T,Th,Sa-HD   carvedilol  12.5 mg Oral BID WC   cloNIDine  0.1 mg Oral BID   clopidogrel  75 mg Oral Daily   [START ON 12/04/2022] docusate sodium  100 mg Oral Daily   ezetimibe  10 mg Oral QPM   feeding supplement (NEPRO CARB STEADY)  237 mL Oral BID BM   gabapentin  100  mg Oral TID   guaiFENesin-dextromethorphan  10 mL Oral Q8H   heparin  5,000 Units Subcutaneous Q8H   insulin aspart  0-15 Units Subcutaneous TID WC   insulin aspart  0-5 Units Subcutaneous QHS   insulin detemir  40 Units Subcutaneous QHS   ipratropium-albuterol  3 mL Nebulization TID   metroNIDAZOLE  500 mg Oral Q12H   multivitamin  1 tablet Oral QHS   nutrition supplement (JUVEN)  1 packet Oral BID BM   pantoprazole  40 mg Oral Daily   sevelamer carbonate  2,400 mg Oral TID WC   sodium chloride flush  3 mL Intravenous Q12H   zinc sulfate  220 mg Oral Daily    Dialysis Orders: [facility] TTS 4:15hr, 450/500, EDW 138.5kg, 3K/2.5Ca, heparin 2600u loading + 1000u/hr - Mircera 30 mcg (last given on 11/25/22) - Venofer 50 mg weekly (last given on 11/25/22) - Calcitriol 2.5 mcg three days a week   Problem/Plan: ESRD: Continue HD on usual TTS schedule - next HD tomorrow. Left second toe gangrene: S/p angioplasty to L ATA and angioplasty/stent to L SFA on 2/5, then 2nd ray amputation this morning. Ortho and VVS following. Remains on IV Vanc/Ceftaz + PO metronidazole. Acute hypoxic respiratory failure: Mild overload + recent Hx COVID: Challenge EDW as tolerated, below prior EDW now - needs to be  lowered on d/c. Hx COVID pneumonia: Off isolation at this time. Hypertension/volume: BP slightly high. Prev on clonidine 0.3mg  TID -> now 0.1mg  TID, prev on Coreg 25mg  BID -> now 12.5mg  BID and off amlodipine. Follow and fluid removal with HD. Anemia of ESRD: Hgb up to 9.8, Mirera last given 11/25/21 - not due yet.  Secondary hyperparathyroidism: Ca/Phos ok - continue home VDRA + binders (sevelamer) Nutrition: Alb low, continue protein supplements T2DM: Per primary.    Veneta Penton, PA-C 12/03/2022, 11:59 AM  [employer]

## 2022-12-03 NOTE — Anesthesia Procedure Notes (Signed)
Anesthesia Regional Block: Popliteal block   Pre-Anesthetic Checklist: , timeout performed,  Correct Patient, Correct Site, Correct Laterality,  Correct Procedure, Correct Position, site marked,  Risks and benefits discussed,  Surgical consent,  Pre-op evaluation,  At surgeon's request and post-op pain management  Laterality: Left  Prep: chloraprep       Needles:  Injection technique: Single-shot  Needle Type: Echogenic Needle     Needle Length: 9cm  Needle Gauge: 21     Additional Needles:   Procedures:,,,, ultrasound used (permanent image in chart),,    Narrative:  Start time: 12/03/2022 7:40 AM End time: 12/03/2022 7:50 AM Injection made incrementally with aspirations every 5 mL.  Performed by: Personally  Anesthesiologist: [name], MD  Additional Notes: No pain on injection. No increased resistance to injection. Injection made in 5cc increments.  Good needle visualization.  Patient tolerated procedure well.

## 2022-12-03 NOTE — Transfer of Care (Signed)
Immediate Anesthesia Transfer of Care Note  Patient: Kelly Key  Procedure(s) Performed: LEFT SECOND TOE AMPUTATION (Left: Second Toe)  Patient Location: PACU  Anesthesia Type:MAC combined with regional for post-op pain  Level of Consciousness: drowsy and patient cooperative  Airway & Oxygen Therapy: Patient Spontanous Breathing and Patient connected to face mask oxygen  Post-op Assessment: Report given to RN and Post -op Vital signs reviewed and stable  Post vital signs: Reviewed and stable  Last Vitals:  Vitals Value Taken Time  BP 149/90 12/03/22 0932  Temp 36.4 C 12/03/22 0930  Pulse 92 12/03/22 0930  Resp 16 12/03/22 0930  SpO2 94 % 12/03/22 0930  Vitals shown include unvalidated device data.  Last Pain:  Vitals:   12/03/22 0930  TempSrc:   PainSc: 0-No pain      Patients Stated Pain Goal: 0 (34/96/11 6435)  Complications: No notable events documented.

## 2022-12-03 NOTE — Op Note (Signed)
11/26/2022 - 12/03/2022  8:47 AM  PATIENT:  Kelly Key    PRE-OPERATIVE DIAGNOSIS:  Gangrene Left 2nd Toe  POST-OPERATIVE DIAGNOSIS:  Same  PROCEDURE:  LEFT SECOND RAY AMPUTATION Application Kerecis micro graft 38 cm.  SURGEON:  Newt Minion, MD  PHYSICIAN ASSISTANT:None ANESTHESIA:   General  PREOPERATIVE INDICATIONS:  [name] is a  52 y.o. male with a diagnosis of Gangrene Left 2nd Toe who failed conservative measures and elected for surgical management.    The risks benefits and alternatives were discussed with the patient preoperatively including but not limited to the risks of infection, bleeding, nerve injury, cardiopulmonary complications, the need for revision surgery, among others, and the patient was willing to proceed.  OPERATIVE IMPLANTS: Kerecis micro graft 38 cm.  @ENCIMAGES @  OPERATIVE FINDINGS: Tissue margins were clear good petechial bleeding.  OPERATIVE PROCEDURE: Patient brought the operating room and underwent a regional anesthetic.  After adequate levels anesthesia were obtained patient's left lower extremities prepped using DuraPrep draped into a sterile field a timeout was called.  A V incision was made around the second ray.  The second toe was amputated through the metatarsal in 1 block of tissue.  The wound was irrigated with normal saline electrocautery was used for hemostasis.  The tissue margins were clear.  The wound was filled with 38 cm of Kerecis micro graft.  The incision was closed using 2-0 nylon a sterile dressing was applied patient was taken the PACU in stable condition.   DISCHARGE PLANNING:  Antibiotic duration: Continue antibiotics for 24 hours  Weightbearing: Touchdown weightbearing on the left  Pain medication: Opioid pathway  Dressing care/ Wound VAC: Dry dressing reinforce as needed  Ambulatory devices: Walker  Discharge to: Anticipate discharge to home  Follow-up: In the office 1 week post operative.

## 2022-12-03 NOTE — Progress Notes (Signed)
PROGRESS NOTE    Kelly Key  EXH:[id number] DOB: [birth year] DOA: 11/26/2022 PCP: Kelly Gravel, MD  Chief Complaint  Patient presents with   Shortness of Breath    Brief Narrative:  Mr. Kelly Key is Kelly Key 52 y.o. male with PMH chronic dCHF, COPD, ESRD on HD, DMII, hepatic steatosis, HLD, and more who presented with dyspnea.  Patient reports that he has had some level of dyspnea since he was diagnosed with COVID.  He also began developing fevers at home and Kelly Key mild nonproductive cough. He also has had an ongoing wound involving his left foot second digit. Exam was concerning for toe gangrene.  He was admitted for IV antibiotics and evaluation with orthopedic surgery and vascular surgery.  Assessment & Plan:   Principal Problem:   Gangrene of toe of left foot (HCC) Active Problems:   Osteomyelitis of second toe of left foot (HCC)   ESRD (end stage renal disease) (HCC)   PAD (peripheral artery disease) (HCC)   Chronic combined systolic and diastolic CHF (congestive heart failure) (HCC)   Diabetes mellitus type 2 in obese (HCC)   GERD (gastroesophageal reflux disease)  Gangrene of toe of left foot (Kelly Key) - Patient says he has been followed for this by his podiatrist Dr. Irving Key in [place] is purulent and foul-smelling -Also has old healed chronic wound involving right foot third digit -Continue antibiotics as per ID (vancomycin, ceftazidime, Flagyl); suspect we can discontinue shortly after surgery - Vascular and orthopedic surgery following -Underwent angiogram 2/5 with angioplasty of left anterior tibial artery and angioplasty plus stent of left SFA - now s/p L second ray amputation   Osteomyelitis of second toe of left foot ([place]) - Suspected due to underlying PAD - continue abx for now - will discuss post op abx plan with Dr. Sharol Key +/- ID - now s/p left second ray amputation with Dr. Sharol Key   PAD (peripheral artery disease) ([place]) -Underwent angiogram 12/01/22 with  angioplasty of left anterior tibial artery and angioplasty plus stent of left SFA - continue asa and statin   ESRD (end stage renal disease) [facility]) - Nephrology arranging hemodialysis - Pt is on schedule with TTS HD treatments   Fever, unspecified-resolved - Blood cultures negative so far, continue following - Suspected due to setting of toe gangrene -- abx as above   Acute respiratory failure with hypoxia (HCC)-resolved  - Likely related to COVID-19 and possibly some fluid overload -- CXR 2/2 without compelling findings of pulm venous hypertension or edema - Patient has been COVID-positive since January 9   History of COVID-19-resolved  - Initially positive on 11/04/2022 -No indication for treatment or isolation as per Dr. Baxter Key    GERD (gastroesophageal reflux disease) - Continue Protonix   Diabetes mellitus type 2 in obese ([place]) - A1c 7.4% on 10/18/22 - continue Levemir - will ctm, may need to adjust going forward - Continue SSI and CBG monitoring   Chronic combined systolic and diastolic CHF (congestive heart failure) (HCC) - Continue aspirin, statin, Coreg, home dose of Lasix - Not clinically in exacerbation - Volume management with hemodialysis    Hyperkalemia-resolved  - HD per renal      DVT prophylaxis: heparin Code Status: full Family Communication: none Disposition:   Status is: Inpatient Remains inpatient appropriate because: continued need for inpatient care, safe d/c plan   Consultants:  Vascular orthopedics  Procedures:  2/5 Procedure Performed: 1.  Ultrasound-guided access right common femoral artery 2.  Aortogram with  catheter selection of aorta 3.  Left lower extremity arteriogram with selection of third order branches 4.  Left anterior tibial artery angioplasty (3 mm x 220 mm [place]) 5.  Angioplasty and stent of left SFA (7 mm x 120 mm drug-coated Eluvia postdilated with Kelly Key 6 mm [place]) 6.  Mynx closure of the right common femoral  artery 7.  67 minutes of monitored moderate conscious sedation time  2/7 LEFT SECOND RAY AMPUTATION   Antimicrobials:  Anti-infectives (From admission, onward)    Start     Dose/Rate Route Frequency Ordered Stop   12/03/22 1045  ceFAZolin (ANCEF) IVPB 2g/100 mL premix        2 g 200 mL/hr over 30 Minutes Intravenous Every 8 hours 12/03/22 0953 12/03/22 1254   12/03/22 0800  ceFAZolin (ANCEF) IVPB 3g/100 mL premix        3 g 200 mL/hr over 30 Minutes Intravenous On call to O.R. 12/02/22 1725 12/03/22 0848   12/02/22 1200  vancomycin (VANCOCIN) IVPB 1000 mg/200 mL premix        1,000 mg 200 mL/hr over 60 Minutes Intravenous Every T-Th-Sa (Hemodialysis) 11/30/22 0921     11/30/22 1015  vancomycin (VANCOCIN) IVPB 1000 mg/200 mL premix        1,000 mg 200 mL/hr over 60 Minutes Intravenous  Once 11/30/22 0918 11/30/22 1209   11/29/22 1800  cefTAZidime (FORTAZ) 1 g in sodium chloride 0.9 % 100 mL IVPB        1 g 200 mL/hr over 30 Minutes Intravenous Every T-Th-Sa (1800) 11/29/22 0902     11/29/22 1600  cefTAZidime (FORTAZ) 2 g in sodium chloride 0.9 % 100 mL IVPB  Status:  Discontinued        2 g 200 mL/hr over 30 Minutes Intravenous Every T-Th-Sa (Hemodialysis) 11/28/22 1621 11/29/22 0902   11/29/22 1600  vancomycin (VANCOCIN) IVPB 1000 mg/200 mL premix  Status:  Discontinued        1,000 mg 200 mL/hr over 60 Minutes Intravenous Every T-Th-Sa (Hemodialysis) 11/28/22 1633 11/30/22 0921   11/28/22 1730  cefTAZidime (FORTAZ) 2 g in sodium chloride 0.9 % 100 mL IVPB        2 g 200 mL/hr over 30 Minutes Intravenous  Once 11/28/22 1619 11/28/22 1820   11/28/22 1100  vancomycin (VANCOREADY) IVPB 2000 mg/400 mL        2,000 mg 200 mL/hr over 120 Minutes Intravenous  Once 11/28/22 1012 11/28/22 1604   11/28/22 1015  cefTRIAXone (ROCEPHIN) 2 g in sodium chloride 0.9 % 100 mL IVPB  Status:  Discontinued        2 g 200 mL/hr over 30 Minutes Intravenous Every 24 hours 11/28/22 0918 11/28/22 1611    11/28/22 1015  metroNIDAZOLE (FLAGYL) tablet 500 mg        500 mg Oral Every 12 hours 11/28/22 0918 12/05/22 0959   11/27/22 1430  doxycycline (VIBRA-TABS) tablet 100 mg  Status:  Discontinued        100 mg Oral Every 12 hours 11/27/22 1333 11/28/22 0918       Subjective: No complaints post op  Objective: Vitals:   12/03/22 0915 12/03/22 0930 12/03/22 1414 12/03/22 1643  BP: (!) 157/99 (!) 149/90  (!) 160/100  Pulse: 90 92  90  Resp: 12 16  20   Temp: 97.7 F (36.5 C) (!) 97.5 F (36.4 C)  98.5 F (36.9 C)  TempSrc:    Oral  SpO2: 94% 94% 95%   Weight:  Height:        Intake/Output Summary (Last 24 hours) at 12/03/2022 1804 Last data filed at 12/03/2022 0941 Gross per 24 hour  Intake 200 ml  Output 10 ml  Net 190 ml   Filed Weights   11/26/22 2322 11/27/22 1325 11/27/22 1845  Weight: (!) 140.7 kg (!) 140.2 kg (!) 136.1 kg    Examination:  General exam: Appears calm and comfortable  Respiratory system: unlabored Cardiovascular system: S1 & S2 heard, RRR.  Gastrointestinal system: Abdomen is nondistended, soft and nontender.  Central nervous system: Alert and oriented. No focal neurological deficits. Extremities: LLE with dressing/splint in place   Data Reviewed: I have personally reviewed following labs and imaging studies  CBC: Recent Labs  Lab 11/28/22 2310 11/30/22 0232 12/01/22 0221 12/02/22 0256 12/03/22 0305  WBC 7.9 6.9 8.2 8.2 9.7  NEUTROABS 4.3 3.3 4.4 4.7 6.0  HGB 9.8* 8.8* 9.7* 9.6* 9.8*  HCT 30.9* 27.6* 29.5* 29.5* 29.9*  MCV 88.8 88.7 86.5 86.8 86.9  PLT 233 269 323 364 419    Basic Metabolic Panel: Recent Labs  Lab 11/27/22 1200 11/28/22 0448 11/29/22 0154 11/30/22 0232 12/01/22 0221 12/02/22 0256 12/03/22 0305  NA 132*   < > 129* 133* 132* 130* 131*  K 4.2   < > 4.1 3.8 4.1 4.4 4.6  CL 92*   < > 90* 92* 91* 90* 89*  CO2 24   < > 23 25 26 23 25   GLUCOSE 150*   < > 226* 141* 164* 145* 182*  BUN 75*   < > 70* 59* 94* 110*  87*  CREATININE 10.77*   < > 10.87* 9.13* 10.84* 11.94* 10.01*  CALCIUM 8.1*   < > 8.0* 8.4* 8.7* 8.5* 8.7*  MG 2.3  --   --  1.9 2.2 2.3 2.1  PHOS  --    < > 5.4* 4.8* 5.5* 6.2* 4.7*   < > = values in this interval not displayed.    GFR: Estimated Creatinine Clearance: 11.2 mL/min (Marshawn Ninneman) (by C-G formula based on SCr of 10.01 mg/dL (H)).  Liver Function Tests: Recent Labs  Lab 11/26/22 1851 11/27/22 1200 11/28/22 0448 11/29/22 0154 11/30/22 0232 12/01/22 0221 12/02/22 0256 12/03/22 0305  AST 19 24  --   --   --   --   --   --   ALT 17 21  --   --   --   --   --   --   ALKPHOS 73 60  --   --   --   --   --   --   BILITOT 0.6 0.7  --   --   --   --   --   --   PROT 7.6 7.3  --   --   --   --   --   --   ALBUMIN 2.9* 2.9*   < > 2.6* 2.8* 2.8* 2.7* 2.7*   < > = values in this interval not displayed.    CBG: Recent Labs  Lab 12/02/22 2122 12/03/22 0727 12/03/22 0857 12/03/22 1202 12/03/22 1709  GLUCAP 236* 181* 164* 301* 295*     Recent Results (from the past 240 hour(s))  Resp panel by RT-PCR (RSV, Flu Aliayah Tyer&B, Covid) Anterior Nasal Swab     Status: Abnormal   Collection Time: 11/26/22  7:58 PM   Specimen: Anterior Nasal Swab  Result Value Ref Range Status   SARS Coronavirus 2 by RT PCR POSITIVE (  Arlee Santosuosso) NEGATIVE Final    Comment: (NOTE) SARS-CoV-2 target nucleic acids are DETECTED.  The SARS-CoV-2 RNA is generally detectable in upper respiratory specimens during the acute phase of infection. Positive results are indicative of the presence of the identified virus, but do not rule out bacterial infection or co-infection with other pathogens not detected by the test. Clinical correlation with patient history and other diagnostic information is necessary to determine patient infection status. The expected result is Negative.  Fact Sheet for Patients: EntrepreneurPulse.com.au  Fact Sheet for Healthcare  Providers: IncredibleEmployment.be  This test is not yet approved or cleared by the [place] FDA and  has been authorized for detection and/or diagnosis of SARS-CoV-2 by FDA under an Emergency Use Authorization (EUA).  This EUA will remain in effect (meaning this test can be used) for the duration of  the COVID-19 declaration under Section 564(b)(1) of the [place], 21 U.S.C. section 360bbb-3(b)(1), unless the authorization is terminated or revoked sooner.     Influenza Chrisangel Eskenazi by PCR NEGATIVE NEGATIVE Final   Influenza B by PCR NEGATIVE NEGATIVE Final    Comment: (NOTE) The Xpert Xpress SARS-CoV-2/FLU/RSV plus assay is intended as an aid in the diagnosis of influenza from Nasopharyngeal swab specimens and should not be used as Jackolyn Geron sole basis for treatment. Nasal washings and aspirates are unacceptable for Xpert Xpress SARS-CoV-2/FLU/RSV testing.  Fact Sheet for Patients: EntrepreneurPulse.com.au  Fact Sheet for Healthcare Providers: IncredibleEmployment.be  This test is not yet approved or cleared by the [place] FDA and has been authorized for detection and/or diagnosis of SARS-CoV-2 by FDA under an Emergency Use Authorization (EUA). This EUA will remain in effect (meaning this test can be used) for the duration of the COVID-19 declaration under Section 564(b)(1) of the Act, 21 U.S.C. section 360bbb-3(b)(1), unless the authorization is terminated or revoked.     Resp Syncytial Virus by PCR NEGATIVE NEGATIVE Final    Comment: (NOTE) Fact Sheet for Patients: EntrepreneurPulse.com.au  Fact Sheet for Healthcare Providers: IncredibleEmployment.be  This test is not yet approved or cleared by the [place] FDA and has been authorized for detection and/or diagnosis of SARS-CoV-2 by FDA under an Emergency Use Authorization (EUA). This EUA will remain in effect (meaning this test can be  used) for the duration of the COVID-19 declaration under Section 564(b)(1) of the Act, 21 U.S.C. section 360bbb-3(b)(1), unless the authorization is terminated or revoked.  Performed at [facility], [address] [place], [place] [address]   Culture, blood (Routine X 2) w Reflex to ID Panel     Status: None   Collection Time: 11/27/22  1:55 PM   Specimen: BLOOD  Result Value Ref Range Status   Specimen Description BLOOD aline  Final   Special Requests   Final    BOTTLES DRAWN AEROBIC AND ANAEROBIC Blood Culture adequate volume   Culture   Final    NO GROWTH 5 DAYS Performed at [facility], [address] [place],  [address]    Report Status 12/02/2022 FINAL  Final  Culture, blood (Routine X 2) w Reflex to ID Panel     Status: None   Collection Time: 11/27/22  1:58 PM   Specimen: BLOOD  Result Value Ref Range Status   Specimen Description BLOOD vline  Final   Special Requests   Final    BOTTLES DRAWN AEROBIC AND ANAEROBIC Blood Culture adequate volume   Culture   Final    NO GROWTH 5 DAYS Performed at [facility]  [address] [place], [place] [address]    Report Status 12/02/2022 FINAL  Final  Surgical pcr screen     Status: Abnormal   Collection Time: 12/02/22 11:21 PM   Specimen: Nasal Mucosa; Nasal Swab  Result Value Ref Range Status   MRSA, PCR POSITIVE (Zhoey Blackstock) NEGATIVE Final    Comment: RESULT CALLED TO, READ BACK BY AND VERIFIED WITH: Jermiya Reichl DELANEY,RN@0048  12/03/22 [place]    Staphylococcus aureus POSITIVE (Jerilee Space) NEGATIVE Final    Comment: (NOTE) The Xpert SA Assay (FDA approved for NASAL specimens in patients 55 years of age and older), is one component of Keyontae Huckeby comprehensive surveillance program. It is not intended to diagnose infection nor to guide or monitor treatment. Performed at [facility], [place] [address], [place], [place] [address]          Radiology Studies: No results found.      Scheduled Meds:  (feeding supplement) PROSource Plus  30 mL  Oral TID WC   vitamin C  1,000 mg Oral Daily   aspirin EC  81 mg Oral Daily   atorvastatin  80 mg Oral Daily   calcitRIOL  2.5 mcg Oral Q T,Th,Sa-HD   carvedilol  12.5 mg Oral BID WC   cloNIDine  0.1 mg Oral BID   clopidogrel  75 mg Oral Daily   [START ON 12/04/2022] docusate sodium  100 mg Oral Daily   ezetimibe  10 mg Oral QPM   feeding supplement (NEPRO CARB STEADY)  237 mL Oral BID BM   gabapentin  100 mg Oral TID   guaiFENesin-dextromethorphan  10 mL Oral Q8H   heparin  5,000 Units Subcutaneous Q8H   insulin aspart  0-15 Units Subcutaneous TID WC   insulin aspart  0-5 Units Subcutaneous QHS   insulin detemir  40 Units Subcutaneous QHS   metroNIDAZOLE  500 mg Oral Q12H   multivitamin  1 tablet Oral QHS   nutrition supplement (JUVEN)  1 packet Oral BID BM   pantoprazole  40 mg Oral Daily   sevelamer carbonate  2,400 mg Oral TID WC   sodium chloride flush  3 mL Intravenous Q12H   zinc sulfate  220 mg Oral Daily   Continuous Infusions:  sodium chloride     sodium chloride 75 mL/hr at 12/03/22 1751   cefTAZidime (FORTAZ)  IV 1 g (12/02/22 1805)   magnesium sulfate bolus IVPB     vancomycin 1,000 mg (12/02/22 1633)     LOS: 6 days    Time spent: over 30 min    Fayrene Helper, MD [facility]   To contact the attending provider between 7A-7P or the covering provider during after hours 7P-7A, please log into the web site [email or web address] and access using universal [place] password for that web site. If you do not have the password, please call the hospital operator.  12/03/2022, 6:04 PM

## 2022-12-03 NOTE — Progress Notes (Signed)
Orthopedic Tech Progress Note Patient Details:  Kelly Key 1971/02/13 [id number]  Ortho Devices Type of Ortho Device: Postop shoe/boot Ortho Device/Splint Location: LLE Ortho Device/Splint Interventions: Ordered   Post Interventions Patient Tolerated: Well Instructions Provided: [facility] [place] 12/03/2022, 9:57 AM

## 2022-12-03 NOTE — Interval H&P Note (Signed)
History and Physical Interval Note:  12/03/2022 6:45 AM  Kelly Key  has presented today for surgery, with the diagnosis of Gangrene Left 2nd Toe.  The various methods of treatment have been discussed with the patient and family. After consideration of risks, benefits and other options for treatment, the patient has consented to  Procedure(s): LEFT 2ND TOE AMPUTATION (Left) as a surgical intervention.  The patient's history has been reviewed, patient examined, no change in status, stable for surgery.  I have reviewed the patient's chart and labs.  Questions were answered to the patient's satisfaction.     Newt Minion

## 2022-12-03 NOTE — Progress Notes (Addendum)
Pharmacy Antibiotic Note  Kelly Key is a 52 y.o. male admitted on 11/26/2022 with  Diabetic foot infection/osteo . Patient presented with SOB, believed to be secondary to pulmonary edema as well as fevers.   Pharmacy has been consulted for Vancomycin and ceftazidime dosing.  Vancomycin and ceftazidime given appropriately after HD 2/6. S/p 2nd ray amputation 2/7 by ortho.   Plan: Continue Vancomycin 1000 mg QHD TTS  Continue ceftazidime 1,000 mg IV every TTS post HD  Flagyl 500mg  po BID ?LOT antibiotics  Height: 5\' 6"  (167.6 cm) Weight: (!) 136.1 kg (300 lb 0.7 oz) IBW/kg (Calculated) : 63.8  Temp (24hrs), Avg:97.9 F (36.6 C), Min:97.5 F (36.4 C), Max:98.6 F (37 C)  Recent Labs  Lab 11/28/22 2310 11/29/22 0154 11/30/22 0232 12/01/22 0221 12/02/22 0256 12/03/22 0305  WBC 7.9  --  6.9 8.2 8.2 9.7  CREATININE  --  10.87* 9.13* 10.84* 11.94* 10.01*     Estimated Creatinine Clearance: 11.2 mL/min (A) (by C-G formula based on SCr of 10.01 mg/dL (H)).    Allergies  Allergen Reactions   Ozempic (0.25 Or 0.5 Mg-Dose) [Semaglutide(0.25 Or 0.5mg -Dos)] Shortness Of Breath   Hydralazine Anxiety    Hallucinations    Lisinopril Swelling    Antimicrobials this admission: Vancomycin 2/2 >>  Ceftriaxone  2/2  Ceftazidime 2/2 >> Metronidazole 2/2>>  Microbiology results: 2/1 BCx: neg  Thank you for allowing pharmacy to be a part of this patient's care.    [place] Crane, Roylene Reason, BCCP Clinical Pharmacist  12/03/2022 12:37 PM   [facility] pharmacy phone numbers are listed on [email or web address]

## 2022-12-03 NOTE — Inpatient Diabetes Management (Signed)
Inpatient Diabetes Program Recommendations  AACE/ADA: New Consensus Statement on Inpatient Glycemic Control   Target Ranges:  Prepandial:   less than 140 mg/dL      Peak postprandial:   less than 180 mg/dL (1-2 hours)      Critically ill patients:  140 - 180 mg/dL    Latest Reference Range & Units 12/02/22 07:36 12/02/22 13:05 12/02/22 16:59 12/02/22 21:22 12/03/22 07:27  Glucose-Capillary 70 - 99 mg/dL 127 (H) 141 (H) 232 (H) 236 (H) 181 (H)    Review of Glycemic Control  Diabetes history: DM2 Outpatient Diabetes medications: Levemir 50 units QHS, Humalog 8-14 units TID with meals Current orders for Inpatient glycemic control: Novolog 0-15 units TID with meals, Novolog 0-5 units QHS, Levemir 40 units QHS  Inpatient Diabetes Program Recommendations:    Insulin: After surgery when diet is resumed, please consider ordering Novolog 3 units TID with meals for meal coverage if patient eats at least 50% of meals.  Thanks, Barnie Alderman, RN, MSN, [place] Diabetes Coordinator Inpatient Diabetes Program [phone] (Team Pager from 8am to [place])

## 2022-12-03 NOTE — Evaluation (Signed)
Physical Therapy Evaluation Patient Details Name: Kelly Key MRN: [id number] DOB: 1971/05/27 Today's Date: 12/03/2022  History of Present Illness  Pt is a 52 y.o. male who presented 11/26/22 with dyspnea. Admitted with acute respiratory failure with hypoxia and found to have gangrene and osteomyelitis of 2nd toe of L foot. S/p left SFA stenting with AT angioplasty 2/5, left 2nd toe amputation 2/7. PMH: chronic diastolic CHF, COPD, ESRD, DM2, hepatic steatosis, HLD, gout, HTN   Clinical Impression  Pt presents with condition above and deficits mentioned below, see PT Problem List. PTA, he was able to ambulate without an AD without assistance and was living with his fiance in a 1-level house with a level entry. Currently, pt is reporting deficits in light touch to his distal L toes and is demonstrating trace muscle activation in his muscles inferior to his L knee, will continue to assess further as time after surgery progresses. Pt is demonstrating difficulty maintaining his TDWB precautions for his L lower extremity with attempts to take steps with a RW, needing up to modA to take a few steps today. Pt also required up to modA to transfer to stand from EOB. He reports his fiance works 3rd shift and is therefore asleep part of the day and he has no one else who can assist him, therefore currently recommending short-term rehab at a SNF to maximize pt's safety and independence with functional mobility prior to return home. If pt progresses well and quickly to a level he does not need physical assistance for basic transfers, he could progress to going home with HHPT, using his w/c for household mobility as needed. Will continue to follow acutely.       Recommendations for follow up therapy are one component of a multi-disciplinary discharge planning process, led by the attending physician.  Recommendations may be updated based on patient status, additional functional criteria and insurance  authorization.  Follow Up Recommendations Skilled nursing-short term rehab (<3 hours/day) (pending progress) Can patient physically be transported by private vehicle: Yes    Assistance Recommended at Discharge Intermittent Supervision/Assistance  Patient can return home with the following  A lot of help with walking and/or transfers;A lot of help with bathing/dressing/bathroom;Assistance with cooking/housework;Assist for transportation    Equipment Recommendations None recommended by PT  Recommendations for Other Services  OT consult    Functional Status Assessment Patient has had a recent decline in their functional status and demonstrates the ability to make significant improvements in function in a reasonable and predictable amount of time.     Precautions / Restrictions Precautions Precautions: Fall Restrictions Weight Bearing Restrictions: Yes LLE Weight Bearing: Touchdown weight bearing Other Position/Activity Restrictions: with post-op shoe      Mobility  Bed Mobility Overal bed mobility: Needs Assistance Bed Mobility: Sit to Supine, Supine to Sit, Rolling Rolling: Supervision   Supine to sit: Supervision, HOB elevated Sit to supine: Min assist, HOB elevated   General bed mobility comments: Supervision and extra time to sit up L EOB from elevated HOB. MinA to lift and direct legs onto bed with return to supine.    Transfers Overall transfer level: Needs assistance Equipment used: Rolling walker (2 wheels) Transfers: Sit to/from Stand Sit to Stand: Mod assist, From elevated surface          Lateral/Scoot Transfers: Min guard General transfer comment: Pt cued for hand placement, but still pushed up to stand with bil hands on RW. Cued pt to place L foot anterior to R to  maintain weight bearing precautions with sit <> stand, modA to stand from elevated EOB. Min guard for x3 lateral scoots to L along EOB    Ambulation/Gait Ambulation/Gait assistance: Mod  assist Gait Distance (Feet): 4 Feet Assistive device: Rolling walker (2 wheels) Gait Pattern/deviations: Step-to pattern, Decreased step length - right, Decreased stance time - left, Decreased stride length, Decreased dorsiflexion - left, Decreased weight shift to left, Antalgic, Trunk flexed Gait velocity: decreased Gait velocity interpretation: <1.31 ft/sec, indicative of household ambulator   General Gait Details: Pt cued to only perform TDWB on L leg and to push through arms on RW to relieve weight on L leg when stepping with his R, but pt reports he was placing some weight on his L leg despite cues as it was difficult to fully unweight it to take steps, thus ceased further mobility at this time. ModA for stability and to try to reduce weight on L leg. Pt needing cues for RW management/sequencing.  Stairs            Wheelchair Mobility    Modified Rankin (Stroke Patients Only)       Balance Overall balance assessment: Needs assistance Sitting-balance support: No upper extremity supported, Feet supported Sitting balance-Leahy Scale: Fair Sitting balance - Comments: Static sitting EOB with supervision for safety   Standing balance support: Bilateral upper extremity supported, During functional activity, Reliant on assistive device for balance Standing balance-Leahy Scale: Poor Standing balance comment: Reliant on RW and external physical assistance                             Pertinent Vitals/Pain Pain Assessment Pain Assessment: Faces Faces Pain Scale: Hurts little more Pain Location: L foot Pain Descriptors / Indicators: Discomfort, Grimacing, Operative site guarding Pain Intervention(s): Limited activity within patient's tolerance, Monitored during session, Repositioned    Home Living Family/patient expects to be discharged to:: Private residence Living Arrangements: Spouse/significant other (refers to her as "roommate" but then reports she is his  "fiance") Available Help at Discharge: Friend(s);Available PRN/intermittently (fiance works 3rd shift) Type of Home: House Home Access: Level entry       Home Layout: One level Home Equipment: Conservation officer, nature (2 wheels);Cane - quad;Shower seat;Wheelchair - manual;BSC/3in1      Prior Function Prior Level of Function : Needs assist;Driving             Mobility Comments: [place]ous[place] ambulator without AD. ADLs Comments: Pt reports that his fiance helps him with lower body dressing and bathing. Able to complete all other ADL's himself. Assisted for IADL's.     Hand Dominance   Dominant Hand: Right    Extremity/Trunk Assessment   Upper Extremity Assessment Upper Extremity Assessment: Defer to OT evaluation    Lower Extremity Assessment Lower Extremity Assessment: LLE deficits/detail;RLE deficits/detail RLE Deficits / Details: dry skin and little wound noted at 3rd distal toe, but appears and pt reports it to be healing; denied numbness/tingling; able to lift against gravity LLE Deficits / Details: s/p 2nd toe amputation 2/7; unable to detect touch distally in toes and unable to actively move ankle or toes, but trace muscle activation noted, will plan to assess further movement once further out after anesthesia; able to lift leg off bed against gravity LLE Sensation: decreased light touch    Cervical / Trunk Assessment Cervical / Trunk Assessment: Normal  Communication   Communication: No difficulties  Cognition Arousal/Alertness: Awake/alert Behavior During Therapy: [facility] for tasks  assessed/performed Overall Cognitive Status: Within Functional Limits for tasks assessed                                          General Comments General comments (skin integrity, edema, etc.): VSS; encouraged AROM of L ankle and to elevated leg when at rest    Exercises     Assessment/Plan    PT Assessment Patient needs continued PT services  PT Problem List Decreased  strength;Decreased activity tolerance;Decreased mobility;Decreased balance;Decreased range of motion;Decreased knowledge of use of DME;Impaired sensation;Decreased skin integrity;Pain       PT Treatment Interventions DME instruction;Gait training;Functional mobility training;Therapeutic activities;Therapeutic exercise;Balance training;Neuromuscular re-education;Patient/family education;Wheelchair mobility training    PT Goals (Current goals can be found in the Care Plan section)  Acute Rehab PT Goals Patient Stated Goal: to be able to take care of himself enough to manage while his fiance has to sleep or go to work PT Goal Formulation: With patient Time For Goal Achievement: 12/17/22 Potential to Achieve Goals: Good    Frequency Min 3X/week     Co-evaluation               AM-PAC PT "6 Clicks" Mobility  Outcome Measure Help needed turning from your back to your side while in a flat bed without using bedrails?: A Little Help needed moving from lying on your back to sitting on the side of a flat bed without using bedrails?: A Little Help needed moving to and from a bed to a chair (including a wheelchair)?: A Lot Help needed standing up from a chair using your arms (e.g., wheelchair or bedside chair)?: A Lot Help needed to walk in hospital room?: Total Help needed climbing 3-5 steps with a railing? : Total 6 Click Score: 12    End of Session Equipment Utilized During Treatment: Gait belt Activity Tolerance: Patient tolerated treatment well Patient left: in bed;with call bell/phone within reach;with bed alarm set Nurse Communication: Mobility status PT Visit Diagnosis: Unsteadiness on feet (R26.81);Other abnormalities of gait and mobility (R26.89);Muscle weakness (generalized) (M62.81);Difficulty in walking, not elsewhere classified (R26.2);Pain Pain - Right/Left: Left Pain - part of body: Ankle and joints of foot    Time: 2637-8588 PT Time Calculation (min) (ACUTE ONLY): 37  min   Charges:   PT Evaluation $PT Eval Moderate Complexity: 1 Mod PT Treatments $Therapeutic Activity: 8-22 mins        Moishe Spice, PT, DPT Acute Rehabilitation Services  Office: [phone]   Orvan Falconer 12/03/2022, 5:24 PM

## 2022-12-04 ENCOUNTER — Encounter (HOSPITAL_COMMUNITY): Payer: Self-pay | Admitting: Orthopedic Surgery

## 2022-12-04 DIAGNOSIS — K219 Gastro-esophageal reflux disease without esophagitis: Secondary | ICD-10-CM | POA: Diagnosis not present

## 2022-12-04 DIAGNOSIS — E1152 Type 2 diabetes mellitus with diabetic peripheral angiopathy with gangrene: Secondary | ICD-10-CM | POA: Diagnosis not present

## 2022-12-04 DIAGNOSIS — N186 End stage renal disease: Secondary | ICD-10-CM | POA: Diagnosis not present

## 2022-12-04 DIAGNOSIS — Z8616 Personal history of COVID-19: Secondary | ICD-10-CM

## 2022-12-04 DIAGNOSIS — Z992 Dependence on renal dialysis: Secondary | ICD-10-CM

## 2022-12-04 DIAGNOSIS — I5042 Chronic combined systolic (congestive) and diastolic (congestive) heart failure: Secondary | ICD-10-CM | POA: Diagnosis not present

## 2022-12-04 DIAGNOSIS — I96 Gangrene, not elsewhere classified: Secondary | ICD-10-CM | POA: Diagnosis not present

## 2022-12-04 LAB — CBC WITH DIFFERENTIAL/PLATELET
Abs Immature Granulocytes: 0.1 10*3/uL — ABNORMAL HIGH (ref 0.00–0.07)
Basophils Absolute: 0 10*3/uL (ref 0.0–0.1)
Basophils Relative: 0 %
Eosinophils Absolute: 0 10*3/uL (ref 0.0–0.5)
Eosinophils Relative: 0 %
HCT: 26.7 % — ABNORMAL LOW (ref 39.0–52.0)
Hemoglobin: 9 g/dL — ABNORMAL LOW (ref 13.0–17.0)
Immature Granulocytes: 1 %
Lymphocytes Relative: 13 %
Lymphs Abs: 1.4 10*3/uL (ref 0.7–4.0)
MCH: 28.6 pg (ref 26.0–34.0)
MCHC: 33.7 g/dL (ref 30.0–36.0)
MCV: 84.8 fL (ref 80.0–100.0)
Monocytes Absolute: 1 10*3/uL (ref 0.1–1.0)
Monocytes Relative: 9 %
Neutro Abs: 8.7 10*3/uL — ABNORMAL HIGH (ref 1.7–7.7)
Neutrophils Relative %: 77 %
Platelets: 417 10*3/uL — ABNORMAL HIGH (ref 150–400)
RBC: 3.15 MIL/uL — ABNORMAL LOW (ref 4.22–5.81)
RDW: 14.4 % (ref 11.5–15.5)
WBC: 11.2 10*3/uL — ABNORMAL HIGH (ref 4.0–10.5)
nRBC: 0 % (ref 0.0–0.2)

## 2022-12-04 LAB — RENAL FUNCTION PANEL
Albumin: 2.7 g/dL — ABNORMAL LOW (ref 3.5–5.0)
Anion gap: 18 — ABNORMAL HIGH (ref 5–15)
BUN: 114 mg/dL — ABNORMAL HIGH (ref 6–20)
CO2: 22 mmol/L (ref 22–32)
Calcium: 8.7 mg/dL — ABNORMAL LOW (ref 8.9–10.3)
Chloride: 86 mmol/L — ABNORMAL LOW (ref 98–111)
Creatinine, Ser: 11.39 mg/dL — ABNORMAL HIGH (ref 0.61–1.24)
GFR, Estimated: 5 mL/min — ABNORMAL LOW (ref >60)
Glucose, Bld: 313 mg/dL — ABNORMAL HIGH (ref 70–99)
Phosphorus: 3.7 mg/dL (ref 2.5–4.6)
Potassium: 4.8 mmol/L (ref 3.5–5.1)
Sodium: 126 mmol/L — ABNORMAL LOW (ref 135–145)

## 2022-12-04 LAB — GLUCOSE, CAPILLARY
Glucose-Capillary: 161 mg/dL — ABNORMAL HIGH (ref 70–99)
Glucose-Capillary: 274 mg/dL — ABNORMAL HIGH (ref 70–99)
Glucose-Capillary: 264 mg/dL — ABNORMAL HIGH (ref 70–99)

## 2022-12-04 LAB — MAGNESIUM: Magnesium: 2.3 mg/dL (ref 1.7–2.4)

## 2022-12-04 MED ORDER — NEPRO/CARBSTEADY PO LIQD
237.0000 mL | Freq: Three times a day (TID) | ORAL | Status: DC
  Administered 2022-12-04 – 2022-12-05 (×4): 237 mL via ORAL

## 2022-12-04 MED ORDER — VITAMIN C 500 MG PO TABS
500.0000 mg | ORAL_TABLET | Freq: Two times a day (BID) | ORAL | Status: DC
  Administered 2022-12-04 – 2022-12-06 (×4): 500 mg via ORAL
  Filled 2022-12-04 (×4): qty 1

## 2022-12-04 MED ORDER — CHLORHEXIDINE GLUCONATE CLOTH 2 % EX PADS
6.0000 | MEDICATED_PAD | Freq: Every day | CUTANEOUS | Status: DC
  Administered 2022-12-04 – 2022-12-06 (×3): 6 via TOPICAL

## 2022-12-04 MED ORDER — HEPARIN SODIUM (PORCINE) 1000 UNIT/ML DIALYSIS
3000.0000 [IU] | Freq: Once | INTRAMUSCULAR | Status: AC
  Administered 2022-12-04: 3000 [IU] via INTRAVENOUS_CENTRAL
  Filled 2022-12-04: qty 3

## 2022-12-04 NOTE — Progress Notes (Addendum)
Nutrition Follow-up  DOCUMENTATION CODES:   Morbid obesity  INTERVENTION:   Increase Nepro Shake to TID po, each supplement provides 425 kcal and 19 grams protein  Continue Vitamin C 500mg  po BID  Continue Zinc 220mg  po daily- would recommend discontinue after 30 days  Rena-vit po daily   Discontinue Prosource Plus and Juven   Daily weights   NUTRITION DIAGNOSIS:   Increased nutrient needs related to wound healing as evidenced by estimated needs.  GOAL:   Patient will meet greater than or equal to 90% of their needs -progressing   MONITOR:   PO intake, Supplement acceptance, Skin, Labs, Weight trends, I & O's  ASSESSMENT:   52 y/o male with hx of ESRD on HD, anemia, CHF, COPD, DM, GERD, gout, CAD, HLD, HTN and OSA  who is admitted with wet gangrene left 2nd toe and COVID 19. Pt s/p angiogram with stent placed to left SFA 2/5 and s/p L second ray amputation 2/7.  RD working remotely.  Spoke with pt via phone. Pt reports good appetite and oral intake in hospital; pt reports eating 75-100% of meals. Pt reports that he does not like the ProSource. Pt reports that he enjoys the Nepro and has been drinking 100% of those. Pt reports that he has been drinking the Juven but reports that its too sweet and he is worried about taking in so much volume. RD will discontinue the ProSource and Juven and increase the Nepro per pt request. RD discussed with pt the importance of adequate nutrition needed to support wound healing and replace losses from HD. Recommend continue vitamins and supplements after discharge and until wound healing is complete. Per chart, pt appears weight stable since admission. No BM noted since 2/4.   Medications reviewed and include: vitamin C, aspirin, calcitriol, plavix, colace, heparin, insulin, rena-vit, protonix, renvela, zinc   Labs reviewed: Na 126(L), K 4.8 wnl, BUN 114(H), creat 11.39(H), P 3.7 wnl, Mg 2.3 wnl BNP- 510(H)- 1/31 Wbc- 11.2(H), Hgb 9.0(L),  Hct 26.7(L) Cbgs- 161, 385, 295, 301, 164, 181 x 48 hrs AIC 7.4(H)- 12/23  Diet Order:   Diet Order             Diet heart healthy/carb modified Room service appropriate? Yes; Fluid consistency: Thin  Diet effective now                  EDUCATION NEEDS:   Education needs have been addressed  Skin:  Skin Assessment: Reviewed RN Assessment (incision L foot)  Last BM:  2/4  Height:   Ht Readings from Last 1 Encounters:  11/26/22 5\' 6"  (1.676 m)    Weight:   Wt Readings from Last 1 Encounters:  12/04/22 (!) 141.8 kg   BMI:  Body mass index is 50.46 kg/m.  Estimated Nutritional Needs:   Kcal:  2500-2800kcal/day  Protein:  125-140g/day  Fluid:  UOP +1L  Koleen Distance MS, RD, LDN Please refer to [facility] for RD and/or RD on-call/weekend/after hours pager

## 2022-12-04 NOTE — Inpatient Diabetes Management (Signed)
Inpatient Diabetes Program Recommendations  AACE/ADA: New Consensus Statement on Inpatient Glycemic Control   Target Ranges:  Prepandial:   less than 140 mg/dL      Peak postprandial:   less than 180 mg/dL (1-2 hours)      Critically ill patients:  140 - 180 mg/dL   Review of Glycemic Control  Latest Reference Range & Units 12/03/22 08:57 12/03/22 12:02 12/03/22 17:09 12/03/22 21:30  Glucose-Capillary 70 - 99 mg/dL 164 (H)  Novolog 2 units   301 (H)  Novolog 11 units 295 (H)  Novolog 8 units 385 (H)  Novolog 5 units   Diabetes history: DM2 Outpatient Diabetes medications: Levemir 50 units QHS, Humalog 8-14 units TID with meals Current orders for Inpatient glycemic control:  Novolog 0-15 units TID with meals + HS Levemir 40 units QHS  Note: pt received Decadron 10 mg yesterday.  Inpatient Diabetes Program Recommendations:    -   Consider ordering Novolog 3 units TID with meals for meal coverage if patient eats at least 50% of meals. -  May consider giving a one time dose of 5 extra units of Levemir today for glucose control due to steroid administration yesterday.  Thanks, Tama Headings RN, MSN, BC-ADM Inpatient Diabetes Coordinator Team Pager [phone] (8a-5p)

## 2022-12-04 NOTE — Progress Notes (Signed)
Received patient in his bed ,awake,alert and oriented x 4.Vitals were stable.  Access used : Left upper arm fistula that worked well,Aneurism noted.  Duration of treatment: 4 hours.  Medicines given: Vancomycin 1 g.                             Oxycodone 10 mg .  Fluid removed : 3 liters.  HD Tx issue : None   Hand off to the patient's nurse.

## 2022-12-04 NOTE — Progress Notes (Signed)
PT Cancellation Note  Patient Details Name: Kelly Key MRN: [id number] DOB: [birth year]   Cancelled Treatment:    Reason Eval/Treat Not Completed: (P) Patient at procedure or test/unavailable, pt off unit at HD. Will check back as schedule allows to continue with PT POC.  Audry Riles. PTA Acute Rehabilitation Services Office: [place] 12/04/2022, 10:17 AM

## 2022-12-04 NOTE — Progress Notes (Signed)
PROGRESS NOTE    Kelly Key  HYW:[id number] DOB: 08/02/1971 DOA: 11/26/2022 PCP: [name], MD  Chief Complaint  Patient presents with   Shortness of Breath    Brief Narrative:  Kelly Key is Kelly Key 52 y.o. male with PMH chronic dCHF, COPD, ESRD on HD, DMII, hepatic steatosis, HLD, and more who presented with dyspnea.  Patient reports that he has had some level of dyspnea since he was diagnosed with COVID.  He also began developing fevers at home and Kelly Key mild nonproductive cough. He also has had an ongoing wound involving his left foot second digit. Exam was concerning for toe gangrene.  He was admitted for IV antibiotics and evaluation with orthopedic surgery and vascular surgery.  Assessment & Plan:   Principal Problem:   Gangrene of toe of left foot (HCC) Active Problems:   Osteomyelitis of second toe of left foot (HCC)   ESRD (end stage renal disease) (HCC)   PAD (peripheral artery disease) (HCC)   Chronic combined systolic and diastolic CHF (congestive heart failure) (HCC)   Diabetes mellitus type 2 in obese (HCC)   GERD (gastroesophageal reflux disease)  Gangrene of toe of left foot ([place]) - Patient says he has been followed for this by his podiatrist Dr. Irving Shows in [place] is purulent and foul-smelling -Also has old healed chronic wound involving right foot third digit -Continue antibiotics as per ID (vancomycin, ceftazidime, Flagyl). Per ID, plan for vancomycin/cefepime x2 weeks with HD. - Vascular and orthopedic surgery following -Underwent angiogram 2/5 with angioplasty of left anterior tibial artery and angioplasty plus stent of left SFA - now s/p L second ray amputation   Osteomyelitis of second toe of left foot ([place]) - Suspected due to underlying PAD - continue abx for now - will discuss post op abx plan with Dr. Sharol Given +/- ID (as above) - now s/p left second ray amputation with Dr. Sharol Given   PAD (peripheral artery disease) [facility]) -Underwent angiogram  12/01/22 with angioplasty of left anterior tibial artery and angioplasty plus stent of left SFA - continue asa and statin   ESRD (end stage renal disease) [facility]) - Nephrology arranging hemodialysis - Pt is on schedule with TTS HD treatments   Fever, unspecified-resolved - Blood cultures negative so far, continue following - Suspected due to setting of toe gangrene -- abx as above   Acute respiratory failure with hypoxia (HCC)-resolved  - Likely related to COVID-19 and possibly some fluid overload -- CXR 2/2 without compelling findings of pulm venous hypertension or edema - Patient has been COVID-positive since January 9   History of COVID-19-resolved  - Initially positive on 11/04/2022 -No indication for treatment or isolation as per Dr. Baxter Flattery    GERD (gastroesophageal reflux disease) - Continue Protonix   Diabetes mellitus type 2 in obese ([place]) - A1c 7.4% on 10/18/22 - continue Levemir - will ctm, may need to adjust going forward - Continue SSI and CBG monitoring   Chronic combined systolic and diastolic CHF (congestive heart failure) (HCC) - Continue aspirin, statin, Coreg, home dose of Lasix - Not clinically in exacerbation - Volume management with hemodialysis    Hyperkalemia-resolved  - HD per renal     DVT prophylaxis: heparin Code Status: full Family Communication: none Disposition:   Status is: Inpatient Remains inpatient appropriate because: continued need for inpatient care, safe d/c plan   Consultants:  Vascular orthopedics  Procedures:  2/5 Procedure Performed: 1.  Ultrasound-guided access right common femoral artery 2.  Aortogram with catheter selection of aorta 3.  Left lower extremity arteriogram with selection of third order branches 4.  Left anterior tibial artery angioplasty (3 mm x 220 mm [place]) 5.  Angioplasty and stent of left SFA (7 mm x 120 mm drug-coated Eluvia postdilated with [name] 6 mm [place]) 6.  Mynx closure of the right common  femoral artery 7.  67 minutes of monitored moderate conscious sedation time  2/7 LEFT SECOND RAY AMPUTATION   Antimicrobials:  Anti-infectives (From admission, onward)    Start     Dose/Rate Route Frequency Ordered Stop   12/03/22 1045  ceFAZolin (ANCEF) IVPB 2g/100 mL premix        2 g 200 mL/hr over 30 Minutes Intravenous Every 8 hours 12/03/22 0953 12/03/22 1254   12/03/22 0800  ceFAZolin (ANCEF) IVPB 3g/100 mL premix        3 g 200 mL/hr over 30 Minutes Intravenous On call to O.R. 12/02/22 1725 12/03/22 0848   12/02/22 1200  vancomycin (VANCOCIN) IVPB 1000 mg/200 mL premix        1,000 mg 200 mL/hr over 60 Minutes Intravenous Every T-Th-Sa (Hemodialysis) 11/30/22 0921     11/30/22 1015  vancomycin (VANCOCIN) IVPB 1000 mg/200 mL premix        1,000 mg 200 mL/hr over 60 Minutes Intravenous  Once 11/30/22 0918 11/30/22 1209   11/29/22 1800  cefTAZidime (FORTAZ) 1 g in sodium chloride 0.9 % 100 mL IVPB        1 g 200 mL/hr over 30 Minutes Intravenous Every T-Th-Sa (1800) 11/29/22 0902     11/29/22 1600  cefTAZidime (FORTAZ) 2 g in sodium chloride 0.9 % 100 mL IVPB  Status:  Discontinued        2 g 200 mL/hr over 30 Minutes Intravenous Every T-Th-Sa (Hemodialysis) 11/28/22 1621 11/29/22 0902   11/29/22 1600  vancomycin (VANCOCIN) IVPB 1000 mg/200 mL premix  Status:  Discontinued        1,000 mg 200 mL/hr over 60 Minutes Intravenous Every T-Th-Sa (Hemodialysis) 11/28/22 1633 11/30/22 0921   11/28/22 1730  cefTAZidime (FORTAZ) 2 g in sodium chloride 0.9 % 100 mL IVPB        2 g 200 mL/hr over 30 Minutes Intravenous  Once 11/28/22 1619 11/28/22 1820   11/28/22 1100  vancomycin (VANCOREADY) IVPB 2000 mg/400 mL        2,000 mg 200 mL/hr over 120 Minutes Intravenous  Once 11/28/22 1012 11/28/22 1604   11/28/22 1015  cefTRIAXone (ROCEPHIN) 2 g in sodium chloride 0.9 % 100 mL IVPB  Status:  Discontinued        2 g 200 mL/hr over 30 Minutes Intravenous Every 24 hours 11/28/22 0918  11/28/22 1611   11/28/22 1015  metroNIDAZOLE (FLAGYL) tablet 500 mg        500 mg Oral Every 12 hours 11/28/22 0918 12/05/22 0959   11/27/22 1430  doxycycline (VIBRA-TABS) tablet 100 mg  Status:  Discontinued        100 mg Oral Every 12 hours 11/27/22 1333 11/28/22 0918       Subjective: Notes some developing pain  Objective: Vitals:   12/03/22 0915 12/03/22 0930 12/03/22 1414 12/03/22 1643  BP: (!) 157/99 (!) 149/90  (!) 160/100  Pulse: 90 92  90  Resp: 12 16  20   Temp: 97.7 F (36.5 C) (!) 97.5 F (36.4 C)  98.5 F (36.9 C)  TempSrc:    Oral  SpO2: 94% 94% 95%  Weight:      Height:        Intake/Output Summary (Last 24 hours) at 12/03/2022 1804 Last data filed at 12/03/2022 0941 Gross per 24 hour  Intake 200 ml  Output 10 ml  Net 190 ml   Filed Weights   11/26/22 2322 11/27/22 1325 11/27/22 1845  Weight: (!) 140.7 kg (!) 140.2 kg (!) 136.1 kg    Examination:  General: No acute distress. Comfortable on dialysis. Cardiovascular: RRR Lungs: unlabored Abdomen: Soft, nontender, nondistended Neurological: Alert and oriented 3. Moves all extremities 4 with equal strength. Cranial nerves II through XII grossly intact. Extremities: dressing to LLE   Data Reviewed: I have personally reviewed following labs and imaging studies  CBC: Recent Labs  Lab 11/28/22 2310 11/30/22 0232 12/01/22 0221 12/02/22 0256 12/03/22 0305  WBC 7.9 6.9 8.2 8.2 9.7  NEUTROABS 4.3 3.3 4.4 4.7 6.0  HGB 9.8* 8.8* 9.7* 9.6* 9.8*  HCT 30.9* 27.6* 29.5* 29.5* 29.9*  MCV 88.8 88.7 86.5 86.8 86.9  PLT 233 269 323 364 782    Basic Metabolic Panel: Recent Labs  Lab 11/27/22 1200 11/28/22 0448 11/29/22 0154 11/30/22 0232 12/01/22 0221 12/02/22 0256 12/03/22 0305  NA 132*   < > 129* 133* 132* 130* 131*  K 4.2   < > 4.1 3.8 4.1 4.4 4.6  CL 92*   < > 90* 92* 91* 90* 89*  CO2 24   < > 23 25 26 23 25   GLUCOSE 150*   < > 226* 141* 164* 145* 182*  BUN 75*   < > 70* 59* 94* 110* 87*   CREATININE 10.77*   < > 10.87* 9.13* 10.84* 11.94* 10.01*  CALCIUM 8.1*   < > 8.0* 8.4* 8.7* 8.5* 8.7*  MG 2.3  --   --  1.9 2.2 2.3 2.1  PHOS  --    < > 5.4* 4.8* 5.5* 6.2* 4.7*   < > = values in this interval not displayed.    GFR: Estimated Creatinine Clearance: 11.2 mL/min (Yamin Swingler) (by C-G formula based on SCr of 10.01 mg/dL (H)).  Liver Function Tests: Recent Labs  Lab 11/26/22 1851 11/27/22 1200 11/28/22 0448 11/29/22 0154 11/30/22 0232 12/01/22 0221 12/02/22 0256 12/03/22 0305  AST 19 24  --   --   --   --   --   --   ALT 17 21  --   --   --   --   --   --   ALKPHOS 73 60  --   --   --   --   --   --   BILITOT 0.6 0.7  --   --   --   --   --   --   PROT 7.6 7.3  --   --   --   --   --   --   ALBUMIN 2.9* 2.9*   < > 2.6* 2.8* 2.8* 2.7* 2.7*   < > = values in this interval not displayed.    CBG: Recent Labs  Lab 12/02/22 2122 12/03/22 0727 12/03/22 0857 12/03/22 1202 12/03/22 1709  GLUCAP 236* 181* 164* 301* 295*     Recent Results (from the past 240 hour(s))  Resp panel by RT-PCR (RSV, Flu Daisa Stennis&B, Covid) Anterior Nasal Swab     Status: Abnormal   Collection Time: 11/26/22  7:58 PM   Specimen: Anterior Nasal Swab  Result Value Ref Range Status   SARS Coronavirus 2 by RT PCR  POSITIVE ([employer]) NEGATIVE Final    Comment: (NOTE) SARS-CoV-2 target nucleic acids are DETECTED.  The SARS-CoV-2 RNA is generally detectable in upper respiratory specimens during the acute phase of infection. Positive results are indicative of the presence of the identified virus, but do not rule out bacterial infection or co-infection with other pathogens not detected by the test. Clinical correlation with patient history and other diagnostic information is necessary to determine patient infection status. The expected result is Negative.  Fact Sheet for Patients: EntrepreneurPulse.com.au  Fact Sheet for Healthcare  Providers: IncredibleEmployment.be  This test is not yet approved or cleared by the [place] FDA and  has been authorized for detection and/or diagnosis of SARS-CoV-2 by FDA under an Emergency Use Authorization (EUA).  This EUA will remain in effect (meaning this test can be used) for the duration of  the COVID-19 declaration under Section 564(b)(1) of the [place]akkary [place]ibault ct, 21 U.S.C. section 360bbb-3(b)(1), unless the authorization is terminated or revoked sooner.     Influenza Zakk Borgen by PCR NEGATIVE NEGATIVE Final   Influenza B by PCR NEGATIVE NEGATIVE Final    Comment: (NOTE) The Xpert Xpress SARS-CoV-2/FLU/RSV plus assay is intended as an aid in the diagnosis of influenza from Nasopharyngeal swab specimens and should not be used as Makensie Mulhall sole basis for treatment. Nasal washings and aspirates are unacceptable for Xpert Xpress SARS-CoV-2/FLU/RSV testing.  Fact Sheet for Patients: EntrepreneurPulse.com.au  Fact Sheet for Healthcare Providers: IncredibleEmployment.be  This test is not yet approved or cleared by the [place] FDA and has been authorized for detection and/or diagnosis of SARS-CoV-2 by FDA under an Emergency Use Authorization (EUA). This EUA will remain in effect (meaning this test can be used) for the duration of the COVID-19 declaration under Section 564(b)(1) of the Act, 21 U.S.C. section 360bbb-3(b)(1), unless the authorization is terminated or revoked.     Resp Syncytial Virus by PCR NEGATIVE NEGATIVE Final    Comment: (NOTE) Fact Sheet for Patients: EntrepreneurPulse.com.au  Fact Sheet for Healthcare Providers: IncredibleEmployment.be  This test is not yet approved or cleared by the [place] FDA and has been authorized for detection and/or diagnosis of SARS-CoV-2 by FDA under an Emergency Use Authorization (EUA). This EUA will remain in effect (meaning this test can be  used) for the duration of the COVID-19 declaration under Section 564(b)(1) of the Act, 21 U.S.C. section 360bbb-3(b)(1), unless the authorization is terminated or revoked.  Performed at [facility], [address] [place], [place] [address]   Culture, blood (Routine X 2) w Reflex to ID Panel     Status: None   Collection Time: 11/27/22  1:55 PM   Specimen: BLOOD  Result Value Ref Range Status   Specimen Description BLOOD aline  Final   Special Requests   Final    BOTTLES DRAWN AEROBIC AND ANAEROBIC Blood Culture adequate volume   Culture   Final    NO GROWTH 5 DAYS Performed at [facility], [address] [place],  [address]    Report Status 12/02/2022 FINAL  Final  Culture, blood (Routine X 2) w Reflex to ID Panel     Status: None   Collection Time: 11/27/22  1:58 PM   Specimen: BLOOD  Result Value Ref Range Status   Specimen Description BLOOD vline  Final   Special Requests   Final    BOTTLES DRAWN AEROBIC AND ANAEROBIC Blood Culture adequate volume   Culture   Final    NO GROWTH 5 DAYS Performed at [facility], [address], [place], [place] [address]    Report Status 12/02/2022 FINAL  Final  Surgical pcr screen     Status: Abnormal   Collection Time: 12/02/22 11:21 PM   Specimen: Nasal Mucosa; Nasal Swab  Result Value Ref Range Status   MRSA, PCR POSITIVE (Avionna Bower) NEGATIVE Final    Comment: RESULT CALLED TO, READ BACK BY AND VERIFIED WITH: Jayce Boyko DELANEY,RN@0048  12/03/22 MK    Staphylococcus aureus POSITIVE (Claudina Oliphant) NEGATIVE Final    Comment: (NOTE) The Xpert SA Assay (FDA approved for NASAL specimens in patients 102 years of age and older), is one component of Azelia Reiger comprehensive surveillance program. It is not intended to diagnose infection nor to guide or monitor treatment. Performed at [facility], [place] [address] [place], [place] [address]          Radiology Studies: No results found.      Scheduled Meds:  (feeding supplement) PROSource Plus  30 mL  Oral TID WC   vitamin C  1,000 mg Oral Daily   aspirin EC  81 mg Oral Daily   atorvastatin  80 mg Oral Daily   calcitRIOL  2.5 mcg Oral Q T,Th,Sa-HD   carvedilol  12.5 mg Oral BID WC   cloNIDine  0.1 mg Oral BID   clopidogrel  75 mg Oral Daily   [START ON 12/04/2022] docusate sodium  100 mg Oral Daily   ezetimibe  10 mg Oral QPM   feeding supplement (NEPRO CARB STEADY)  237 mL Oral BID BM   gabapentin  100 mg Oral TID   guaiFENesin-dextromethorphan  10 mL Oral Q8H   heparin  5,000 Units Subcutaneous Q8H   insulin aspart  0-15 Units Subcutaneous TID WC   insulin aspart  0-5 Units Subcutaneous QHS   insulin detemir  40 Units Subcutaneous QHS   metroNIDAZOLE  500 mg Oral Q12H   multivitamin  1 tablet Oral QHS   nutrition supplement (JUVEN)  1 packet Oral BID BM   pantoprazole  40 mg Oral Daily   sevelamer carbonate  2,400 mg Oral TID WC   sodium chloride flush  3 mL Intravenous Q12H   zinc sulfate  220 mg Oral Daily   Continuous Infusions:  sodium chloride     sodium chloride 75 mL/hr at 12/03/22 1751   cefTAZidime (FORTAZ)  IV 1 g (12/02/22 1805)   magnesium sulfate bolus IVPB     vancomycin 1,000 mg (12/02/22 1633)     LOS: 6 days    Time spent: over 30 min    Fayrene Helper, MD [facility]   To contact the attending provider between 7A-7P or the covering provider during after hours 7P-7A, please log into the web site [email or web address] and access using universal [place] password for that web site. If you do not have the password, please call the hospital operator.  12/03/2022, 6:04 PM

## 2022-12-04 NOTE — Progress Notes (Signed)
[facility] Progress Note   Subjective:  Seen on HD - 3L UFG and tolerating. Foot pain worse this AM. No CP/dyspnea.  Objective Vitals:   12/03/22 1643 12/03/22 1948 12/04/22 0357 12/04/22 0825  BP: (!) 160/100 (!) 179/115 (!) 159/74 (!) 180/90  Pulse: 90 (!) 101 98 93  Resp: 20   14  Temp: 98.5 F (36.9 C) 97.8 F (36.6 C) 98.2 F (36.8 C)   TempSrc: Oral Oral Oral   SpO2:  98% 97% 97%  Weight:    (!) 141.8 kg  Height:       Physical Exam General: Well appearing man, NAD. Room air. Heart: RRR; no murmur Lungs: CTA anteriorly Abdomen: soft Extremities: L foot bandaged, trace BLE edema Dialysis Access: LUE AVF  Additional Objective Labs: Basic Metabolic Panel: Recent Labs  Lab 12/02/22 0256 12/03/22 0305 12/04/22 0144  NA 130* 131* 126*  K 4.4 4.6 4.8  CL 90* 89* 86*  CO2 23 25 22   GLUCOSE 145* 182* 313*  BUN 110* 87* 114*  CREATININE 11.94* 10.01* 11.39*  CALCIUM 8.5* 8.7* 8.7*  PHOS 6.2* 4.7* 3.7   Liver Function Tests: Recent Labs  Lab 11/27/22 1200 11/28/22 0448 12/02/22 0256 12/03/22 0305 12/04/22 0144  AST 24  --   --   --   --   ALT 21  --   --   --   --   ALKPHOS 60  --   --   --   --   BILITOT 0.7  --   --   --   --   PROT 7.3  --   --   --   --   ALBUMIN 2.9*   < > 2.7* 2.7* 2.7*   < > = values in this interval not displayed.   CBC: Recent Labs  Lab 11/30/22 0232 12/01/22 0221 12/02/22 0256 12/03/22 0305 12/04/22 0144  WBC 6.9 8.2 8.2 9.7 11.2*  NEUTROABS 3.3 4.4 4.7 6.0 8.7*  HGB 8.8* 9.7* 9.6* 9.8* 9.0*  HCT 27.6* 29.5* 29.5* 29.9* 26.7*  MCV 88.7 86.5 86.8 86.9 84.8  PLT 269 323 364 367 417*   Medications:  sodium chloride     sodium chloride 75 mL/hr at 12/03/22 1751   cefTAZidime (FORTAZ)  IV 1 g (12/02/22 1805)   magnesium sulfate bolus IVPB     vancomycin 1,000 mg (12/02/22 1633)    (feeding supplement) PROSource Plus  30 mL Oral TID WC   vitamin C  1,000 mg Oral Daily   aspirin EC  81 mg Oral Daily    atorvastatin  80 mg Oral Daily   calcitRIOL  2.5 mcg Oral Q T,Th,Sa-HD   carvedilol  12.5 mg Oral BID WC   Chlorhexidine Gluconate Cloth  6 each Topical Q0600   cloNIDine  0.1 mg Oral BID   clopidogrel  75 mg Oral Daily   docusate sodium  100 mg Oral Daily   ezetimibe  10 mg Oral QPM   feeding supplement (NEPRO CARB STEADY)  237 mL Oral BID BM   gabapentin  100 mg Oral TID   guaiFENesin-dextromethorphan  10 mL Oral Q8H   heparin  3,000 Units Dialysis Once in dialysis   heparin  5,000 Units Subcutaneous Q8H   insulin aspart  0-15 Units Subcutaneous TID WC   insulin aspart  0-5 Units Subcutaneous QHS   insulin detemir  40 Units Subcutaneous QHS   metroNIDAZOLE  500 mg Oral Q12H   multivitamin  1 tablet  Oral QHS   nutrition supplement (JUVEN)  1 packet Oral BID BM   pantoprazole  40 mg Oral Daily   sevelamer carbonate  2,400 mg Oral TID WC   sodium chloride flush  3 mL Intravenous Q12H   zinc sulfate  220 mg Oral Daily    Dialysis Orders: [facility] TTS 4:15hr, 450/500, EDW 138.5kg, 3K/2.5Ca, heparin 2600u loading + 1000u/hr - Mircera 30 mcg (last given on 11/25/22) - Venofer 50 mg weekly (last given on 11/25/22) - Calcitriol 2.5 mcg three days a week   Problem/Plan: ESRD: Continue HD on usual TTS schedule - HD now, 3L UFG. Left second toe gangrene: S/p angioplasty to L ATA and angioplasty/stent to L SFA on 2/5, then 2nd ray amputation 2/7. Ortho and VVS following. Remains on IV Vanc/Ceftaz + PO metronidazole. Acute hypoxic respiratory failure: Mild overload + recent Hx COVID: Challenge EDW as tolerated, below prior EDW now - needs to be lowered on d/c. Hx COVID pneumonia: Off isolation at this time. Hypertension/volume: BP high. Prev on clonidine 0.3mg  TID -> now 0.1mg  TID, prev on Coreg 25mg  BID -> now 12.5mg  BID and off amlodipine. Follow and fluid removal with HD. Anemia of ESRD: Hgb 9, Mirera last given 11/25/21 - not due yet.  Secondary hyperparathyroidism: Ca/Phos ok -  continue home VDRA + binders (sevelamer) Nutrition: Alb low, continue protein supplements T2DM: Per primary.    Veneta Penton, PA-C 12/04/2022, 8:40 AM  [employer]

## 2022-12-04 NOTE — TOC Initial Note (Addendum)
Transition of Care [facility]) - Initial/Assessment Note    Patient Details  Name: Kelly Key MRN: [id number] Date of Birth: 02-18-1971  Transition of Care [facility]) CM/SW Contact:    Milas Gain, [place] Phone Number: 12/04/2022, 1:36 PM  Clinical Narrative:                  CSW received consult for possible SNF placement at time of discharge. CSW spoke with patient regarding PT recommendation of SNF placement at time of discharge. Patient reports PTA that he came from home with significant other Kelly Key.Patient expressed understanding of PT recommendation and is agreeable to SNF placement at time of discharge. Patient gave CSW permission to fax out initial referral near the [place] area for possible SNF placement.. CSW discussed insurance authorization process and will  provide Medicare SNF ratings list with accepted SNF bed offers when available.  Patient expressed being hopeful for rehab and to feel better soon. No further questions reported at this time. CSW to continue to follow and assist with discharge planning needs.   CSW started insurance authorization for patient. [place] ID# [id number]  Expected Discharge Plan: [place] Barriers to Discharge: Continued Medical Work up   Patient Goals and CMS Choice Patient states their goals for this hospitalization and ongoing recovery are:: SNF CMS Medicare.gov Compare Post Acute Care list provided to:: Patient Choice offered to / list presented to : Patient      Expected Discharge Plan and Services In-house Referral: Clinical Social Work Discharge Planning Services: CM Consult Post Acute Care Choice: [place] arrangements for the past 2 months: [place]                   DME Agency: NA                  Prior Living Arrangements/Services Living arrangements for the past 2 months: Single Family Home Lives with:: Significant Other Patient language and need for interpreter  reviewed:: Yes Do you feel safe going back to the place where you live?: No   SNF  Need for Family Participation in Patient Care: Yes (Comment) Care giver support system in place?: Yes (comment) Current home services: DME (cane, walker, wheelchair) Criminal Activity/Legal Involvement Pertinent to Current Situation/Hospitalization: No - Comment as needed  Activities of Daily Living Home Assistive Devices/Equipment: Bathtub lift ADL Screening (condition at time of admission) Patient's cognitive ability adequate to safely complete daily activities?: Yes Is the patient deaf or have difficulty hearing?: No Does the patient have difficulty seeing, even when wearing glasses/contacts?: No Does the patient have difficulty concentrating, remembering, or making decisions?: No Patient able to express need for assistance with ADLs?: Yes Does the patient have difficulty dressing or bathing?: No Independently performs ADLs?: Yes (appropriate for developmental age) Communication: Independent Dressing (OT): Independent Grooming: Independent Feeding: Independent Bathing: Independent Toileting: Independent In/Out Bed: Independent Walks in Home: Independent Does the patient have difficulty walking or climbing stairs?: Yes Weakness of Legs: None Weakness of Arms/Hands: None  Permission Sought/Granted Permission sought to share information with : Case Freight forwarder, Customer service manager, Family Supports Permission granted to share information with : Yes, Verbal Permission Granted  Share Information with NAME: Kelly Key  Permission granted to share info w AGENCY: SNF  Permission granted to share info w Relationship: significant other  Permission granted to share info w Contact Information: Jolayne Haines [phone]  Emotional Assessment Appearance:: Appears stated age Attitude/Demeanor/Rapport: Gracious Affect (typically observed): Calm  Orientation: : Oriented to Self, Oriented to Place, Oriented to   Time, Oriented to Situation Alcohol / Substance Use: Not Applicable Psych Involvement: No (comment)  Admission diagnosis:  Acute respiratory failure with hypoxia (HCC) [J96.01] Patient Active Problem List   Diagnosis Date Noted   PAD (peripheral artery disease) ([place]) 12/01/2022   Gangrene of toe of left foot ([place]) 11/28/2022   Diabetes mellitus type 2 in obese ([place]) 11/27/2022   GERD (gastroesophageal reflux disease) 11/27/2022   Flash pulmonary edema ([place]) 11/04/2022   Dysphagia 10/18/2022   Osteomyelitis of second toe of left foot ([place]) 11/13/2021   OSA (obstructive sleep apnea) 10/04/2021   Obesity hypoventilation syndrome ([place]) 10/04/2021   Organic impotence 08/16/2021   NSTEMI (non-ST elevated myocardial infarction) ([place]) 05/17/2021   Acute gout of right knee 08/03/2019   Orthostasis    Pain    Physical debility 07/16/2019   ESRD (end stage renal disease) ([place])    Anemia of chronic disease    Uncontrolled type 2 diabetes mellitus with hyperglycemia (HCC)    Volume overload 07/05/2019   Anasarca 07/03/2019   Dyslipidemia 07/03/2019   Chronic combined systolic and diastolic CHF (congestive heart failure) ([place]) 07/03/2019   Bilateral leg edema    Hypokalemia    Thromboembolism ([place]) 02/20/2019   Diabetes mellitus type 2 in nonobese ([place]) 06/12/2012   Hyponatremia 06/12/2012   Microcytic anemia 06/12/2012   Essential hypertension, benign 06/11/2012   Elevated LFTs 06/11/2012   Morbid obesity ([place]) 06/11/2012   Hepatic steatosis 06/11/2012   PCP:  [name], MD Pharmacy:   [facility] [address] [place] [address] Phone: [phone] Fax: [place], [place] [address] [place] [address] Phone: [phone] Fax: [phone]     Social Determinants of Health (SDOH) Social History: SDOH Screenings   Food Insecurity: No Food Insecurity (11/26/2022)  Housing: Low Risk   (11/26/2022)  Transportation Needs: No Transportation Needs (11/26/2022)  Utilities: Not At Risk (11/26/2022)  Depression (PHQ2-9): Low Risk  (12/25/2021)  Financial Resource Strain: Low Risk  (02/20/2019)  Physical Activity: Unknown (02/20/2019)  Social Connections: Unknown (02/20/2019)  Stress: No Stress Concern Present (02/20/2019)  Tobacco Use: Low Risk  (12/04/2022)   SDOH Interventions:     Readmission Risk Interventions    12/02/2022    4:02 PM 11/28/2022    1:43 PM 11/04/2022    1:04 PM  Readmission Risk Prevention Plan  Transportation Screening Complete Complete Complete  Medication Review Press photographer) Complete Complete Complete  HRI or Home Care Consult Complete Complete Complete  SW Recovery Care/Counseling Consult Complete Complete Complete  Palliative Care Screening Not Applicable Not Applicable Not Applicable  Skilled Nursing Facility Not Applicable Not Applicable Not Applicable

## 2022-12-04 NOTE — Progress Notes (Signed)
PHARMACY CONSULT NOTE FOR:  OUTPATIENT  PARENTERAL ANTIBIOTIC THERAPY (OPAT)  Informational only as the patient will receive antibiotics outpatient at the HD center. Renal team made aware  Indication: L-foot infection Regimen: Vancomycin 1g/HD-TTS + Cefepime 2g/HD-TTS End date: 12/16/22  IV antibiotic discharge orders are pended. To discharging provider:  please sign these orders via discharge navigator,  Select New Orders & click on the button choice - Manage This Unsigned Work.    Thank you for allowing pharmacy to be a part of this patient's care.  Alycia Rossetti, PharmD, BCPS Infectious Diseases Clinical Pharmacist 12/05/2022 8:24 AM   **Pharmacist phone directory can now be found on [email or web address] ([email or web address]).  Listed under [place].

## 2022-12-04 NOTE — Progress Notes (Signed)
OT Cancellation Note  Patient Details Name: Kelly Key MRN: [id number] DOB: June 04, 1971   Cancelled Treatment:    Reason Eval/Treat Not Completed: Patient at procedure or test/ unavailable. Pt off floor at HD. Will check back for OT assessment as schedule and time allows.  Percell Miller Beth Dixon, OTR/L 12/04/2022, 9:44 AM

## 2022-12-04 NOTE — Anesthesia Postprocedure Evaluation (Signed)
Anesthesia Post Note  Patient: Kelly Key  Procedure(s) Performed: LEFT SECOND TOE AMPUTATION (Left: Second Toe)     Patient location during evaluation: PACU Anesthesia Type: Regional Level of consciousness: awake and alert Pain management: pain level controlled Vital Signs Assessment: post-procedure vital signs reviewed and stable Respiratory status: spontaneous breathing, nonlabored ventilation, respiratory function stable and patient connected to nasal cannula oxygen Cardiovascular status: stable and blood pressure returned to baseline Postop Assessment: no apparent nausea or vomiting Anesthetic complications: no   No notable events documented.  Last Vitals:  Vitals:   12/04/22 1248 12/04/22 1327  BP: (!) 173/108 (!) 161/91  Pulse: 93 94  Resp: 11 18  Temp: 37 C (!) 36.3 C  SpO2: 100% 100%    Last Pain:  Vitals:   12/04/22 1327  TempSrc: Oral  PainSc:                  [name]

## 2022-12-04 NOTE — NC FL2 (Signed)
[place] MEDICAID FL2 LEVEL OF CARE FORM     IDENTIFICATION  Patient Name: PATSY VILLAMAR Birthdate: 07/31/1971 Sex: male Admission Date (Current Location): 11/26/2022  [facility] and [place] Number:  Herbalist and Address:  [facility], [place] [address], [place], [place] [address]      Provider Number: [id number]  Attending Physician Name and Address:  Elodia Florence., *  Relative Name and Phone Number:  Jolayne Haines (significant other) [phone]    Current Level of Care: Hospital Recommended Level of Care: [place] Prior Approval Number:    Date Approved/Denied:   PASRR Number: [id number]  Discharge Plan: SNF    Current Diagnoses: Patient Active Problem List   Diagnosis Date Noted   PAD (peripheral artery disease) ([place]) 12/01/2022   Gangrene of toe of left foot ([place]) 11/28/2022   Diabetes mellitus type 2 in obese ([place]) 11/27/2022   GERD (gastroesophageal reflux disease) 11/27/2022   Flash pulmonary edema ([place]) 11/04/2022   Dysphagia 10/18/2022   Osteomyelitis of second toe of left foot ([place]) 11/13/2021   OSA (obstructive sleep apnea) 10/04/2021   Obesity hypoventilation syndrome ([place]) 10/04/2021   Organic impotence 08/16/2021   NSTEMI (non-ST elevated myocardial infarction) ([place]) 05/17/2021   Acute gout of right knee 08/03/2019   Orthostasis    Pain    Physical debility 07/16/2019   ESRD (end stage renal disease) ([place])    Anemia of chronic disease    Uncontrolled type 2 diabetes mellitus with hyperglycemia ([place])    Volume overload 07/05/2019   Anasarca 07/03/2019   Dyslipidemia 07/03/2019   Chronic combined systolic and diastolic CHF (congestive heart failure) ([place]) 07/03/2019   Bilateral leg edema    Hypokalemia    Thromboembolism ([place]) 02/20/2019   Diabetes mellitus type 2 in nonobese ([place]) 06/12/2012   Hyponatremia 06/12/2012   Microcytic anemia 06/12/2012   Essential hypertension, benign 06/11/2012    Elevated LFTs 06/11/2012   Morbid obesity ([place]) 06/11/2012   Hepatic steatosis 06/11/2012    Orientation RESPIRATION BLADDER Height & Weight     Self, Time, Situation, Place  Normal Continent Weight: (!) 312 lb 9.8 oz (141.8 kg) Height:  5' 6"$  (167.6 cm)  BEHAVIORAL SYMPTOMS/MOOD NEUROLOGICAL BOWEL NUTRITION STATUS      Continent Diet (Please see discharge summary)  AMBULATORY STATUS COMMUNICATION OF NEEDS Skin   Limited Assist Verbally Other (Comment) (dry,Ecchymosis,abdomen,L,lower,toe,L,wound/Incision LDAs,Incision Closed,foot,L,Wound/Incision open or dehiced,diabetic ulcer,toe,anterior,L,mid,Wound/Incision open or dehiced diabetic ulcer,toe,anterior,R)                       Personal Care Assistance Level of Assistance  Bathing, Feeding, Dressing Bathing Assistance: Maximum assistance Feeding assistance: Independent Dressing Assistance: Maximum assistance     Functional Limitations Info  Sight, Hearing, Speech Sight Info: Impaired Hearing Info: Adequate (WDL) Speech Info: Adequate    SPECIAL CARE FACTORS FREQUENCY  PT (By licensed PT), OT (By licensed OT)     PT Frequency: 5x min weekly OT Frequency: 5x min weekly            Contractures Contractures Info: Not present    Additional Factors Info  Code Status, Allergies, Insulin Sliding Scale Code Status Info: FULL Allergies Info: Ozempic (0.25 Or 0.5 Mg-dose) (semaglutide(0.25 Or 0.61m-dos)),Hydralazine,Lisinopril   Insulin Sliding Scale Info: insulin aspart (novoLOG) injection 0-15 Units 3 times daily with meals,  insulin aspart (novoLOG) injection 0-5 Units daily at bedtime,insulin detemir (LEVEMIR) injection 40 Units daily at bedtime  Current Medications (12/04/2022):  This is the current hospital active medication list Current Facility-Administered Medications  Medication Dose Route Frequency Provider Last Rate Last Admin   (feeding supplement) PROSource Plus liquid 30 mL  30 mL Oral TID WC Newt Minion, MD   30 mL at 12/03/22 1746   0.9 %  sodium chloride infusion  250 mL Intravenous PRN Newt Minion, MD   New Bag at 12/03/22 [phone]   0.9 %  sodium chloride infusion   Intravenous Continuous Newt Minion, MD 75 mL/hr at 12/03/22 1751 New Bag at 12/03/22 1751   acetaminophen (TYLENOL) tablet 325-650 mg  325-650 mg Oral Q6H PRN Newt Minion, MD   650 mg at 12/04/22 0616   albuterol (VENTOLIN HFA) 108 (90 Base) MCG/ACT inhaler 2 puff  2 puff Inhalation Q4H PRN Newt Minion, MD       alum & mag hydroxide-simeth (MAALOX/MYLANTA) 200-200-20 MG/5ML suspension 15-30 mL  15-30 mL Oral Q2H PRN Newt Minion, MD       ascorbic acid (VITAMIN C) tablet 1,000 mg  1,000 mg Oral Daily Newt Minion, MD   1,000 mg at 12/03/22 1130   aspirin EC tablet 81 mg  81 mg Oral Daily Newt Minion, MD   81 mg at 12/03/22 1132   atorvastatin (LIPITOR) tablet 80 mg  80 mg Oral Daily Newt Minion, MD   80 mg at 12/03/22 1132   bisacodyl (DULCOLAX) EC tablet 5 mg  5 mg Oral Daily PRN Newt Minion, MD       calcitRIOL (ROCALTROL) capsule 2.5 mcg  2.5 mcg Oral Q T,Th,Sa-HD Newt Minion, MD   2.5 mcg at 12/02/22 1330   calcium carbonate (dosed in mg elemental calcium) suspension 500 mg of elemental calcium  500 mg of elemental calcium Oral Q6H PRN Newt Minion, MD       carvedilol (COREG) tablet 12.5 mg  12.5 mg Oral BID WC Newt Minion, MD   12.5 mg at 12/03/22 1749   Chlorhexidine Gluconate Cloth 2 % PADS 6 each  6 each Topical Q0600 Elodia Florence., MD   6 each at 12/04/22 0606   cloNIDine (CATAPRES) tablet 0.1 mg  0.1 mg Oral BID Newt Minion, MD   0.1 mg at 12/03/22 2141   clopidogrel (PLAVIX) tablet 75 mg  75 mg Oral Daily Newt Minion, MD   75 mg at 12/03/22 1132   docusate sodium (COLACE) capsule 100 mg  100 mg Oral Daily Newt Minion, MD       ezetimibe (ZETIA) tablet 10 mg  10 mg Oral QPM Newt Minion, MD   10 mg at 12/03/22 1748   feeding supplement (NEPRO CARB STEADY) liquid  237 mL  237 mL Oral BID BM Newt Minion, MD   237 mL at 12/02/22 1510   gabapentin (NEURONTIN) capsule 100 mg  100 mg Oral TID Newt Minion, MD   100 mg at 12/03/22 2141   guaiFENesin-dextromethorphan (ROBITUSSIN DM) 100-10 MG/5ML syrup 10 mL  10 mL Oral Q8H Newt Minion, MD   10 mL at 12/04/22 0607   guaiFENesin-dextromethorphan (ROBITUSSIN DM) 100-10 MG/5ML syrup 15 mL  15 mL Oral Q4H PRN Newt Minion, MD       heparin injection 5,000 Units  5,000 Units Subcutaneous Q8H Newt Minion, MD   5,000 Units at 12/04/22 0606   hydrALAZINE (APRESOLINE) injection 5 mg  5  mg Intravenous Q20 Min PRN Newt Minion, MD       HYDROmorphone (DILAUDID) injection 0.5-1 mg  0.5-1 mg Intravenous Q4H PRN Newt Minion, MD       hydrOXYzine (ATARAX) tablet 25 mg  25 mg Oral TID PRN Newt Minion, MD   25 mg at 11/27/22 0735   insulin aspart (novoLOG) injection 0-15 Units  0-15 Units Subcutaneous TID WC Newt Minion, MD   8 Units at 12/03/22 1747   insulin aspart (novoLOG) injection 0-5 Units  0-5 Units Subcutaneous QHS Newt Minion, MD   5 Units at 12/03/22 2140   insulin detemir (LEVEMIR) injection 40 Units  40 Units Subcutaneous QHS Newt Minion, MD   40 Units at 12/03/22 2140   labetalol (NORMODYNE) injection 10 mg  10 mg Intravenous Q2H PRN Newt Minion, MD       labetalol (NORMODYNE) injection 10 mg  10 mg Intravenous Q10 min PRN Newt Minion, MD       magnesium sulfate IVPB 2 g 50 mL  2 g Intravenous Daily PRN Newt Minion, MD       metoprolol tartrate (LOPRESSOR) injection 2-5 mg  2-5 mg Intravenous Q2H PRN Newt Minion, MD       multivitamin (RENA-VIT) tablet 1 tablet  1 tablet Oral QHS Newt Minion, MD   1 tablet at 12/03/22 2141   nutrition supplement (JUVEN) (JUVEN) powder packet 1 packet  1 packet Oral BID BM Newt Minion, MD   1 packet at 12/03/22 1746   ondansetron (ZOFRAN) tablet 4 mg  4 mg Oral Q6H PRN Newt Minion, MD       Or   ondansetron [facility]) injection 4 mg  4  mg Intravenous Q6H PRN Newt Minion, MD   4 mg at 12/02/22 0843   ondansetron (ZOFRAN) injection 4 mg  4 mg Intravenous Q6H PRN Newt Minion, MD   4 mg at 12/01/22 1256   Oral care mouth rinse  15 mL Mouth Rinse PRN Newt Minion, MD       oxyCODONE (Oxy IR/ROXICODONE) immediate release tablet 10-15 mg  10-15 mg Oral Q4H PRN Newt Minion, MD       oxyCODONE (Oxy IR/ROXICODONE) immediate release tablet 5-10 mg  5-10 mg Oral Q4H PRN Newt Minion, MD   10 mg at 12/04/22 1211   pantoprazole (PROTONIX) EC tablet 40 mg  40 mg Oral Daily Newt Minion, MD   40 mg at 12/03/22 1132   phenol (CHLORASEPTIC) mouth spray 1 spray  1 spray Mouth/Throat PRN Newt Minion, MD       polyethylene glycol (MIRALAX / GLYCOLAX) packet 17 g  17 g Oral Daily PRN Newt Minion, MD       potassium chloride SA (KLOR-CON M) CR tablet 20-40 mEq  20-40 mEq Oral Daily PRN Newt Minion, MD       sevelamer carbonate (RENVELA) tablet 2,400 mg  2,400 mg Oral TID WC Newt Minion, MD   2,400 mg at 12/04/22 0740   sodium chloride flush (NS) 0.9 % injection 3 mL  3 mL Intravenous Q12H Newt Minion, MD   3 mL at 12/03/22 1137   sodium chloride flush (NS) 0.9 % injection 3 mL  3 mL Intravenous PRN Newt Minion, MD       zinc sulfate capsule 220 mg  220 mg Oral Daily Newt Minion, MD  220 mg at 12/03/22 1129     Discharge Medications: Please see discharge summary for a list of discharge medications.  Relevant Imaging Results:  Relevant Lab Results:   Additional Information [id number], HD at [facility] on TTS 6:30am chair time  Milas Gain, [employer]

## 2022-12-04 NOTE — Progress Notes (Signed)
Subjective: No new complaints   Antibiotics:  Anti-infectives (From admission, onward)    Start     Dose/Rate Route Frequency Ordered Stop   12/03/22 1045  ceFAZolin (ANCEF) IVPB 2g/100 mL premix        2 g 200 mL/hr over 30 Minutes Intravenous Every 8 hours 12/03/22 0953 12/03/22 1254   12/03/22 0800  ceFAZolin (ANCEF) IVPB 3g/100 mL premix        3 g 200 mL/hr over 30 Minutes Intravenous On call to O.R. 12/02/22 1725 12/03/22 0848   12/02/22 1200  vancomycin (VANCOCIN) IVPB 1000 mg/200 mL premix  Status:  Discontinued        1,000 mg 200 mL/hr over 60 Minutes Intravenous Every T-Th-Sa (Hemodialysis) 11/30/22 0921 12/04/22 1403   11/30/22 1015  vancomycin (VANCOCIN) IVPB 1000 mg/200 mL premix        1,000 mg 200 mL/hr over 60 Minutes Intravenous  Once 11/30/22 0918 11/30/22 1209   11/29/22 1800  cefTAZidime (FORTAZ) 1 g in sodium chloride 0.9 % 100 mL IVPB  Status:  Discontinued        1 g 200 mL/hr over 30 Minutes Intravenous Every T-Th-Sa (1800) 11/29/22 0902 12/04/22 1403   11/29/22 1600  cefTAZidime (FORTAZ) 2 g in sodium chloride 0.9 % 100 mL IVPB  Status:  Discontinued        2 g 200 mL/hr over 30 Minutes Intravenous Every T-Th-Sa (Hemodialysis) 11/28/22 1621 11/29/22 0902   11/29/22 1600  vancomycin (VANCOCIN) IVPB 1000 mg/200 mL premix  Status:  Discontinued        1,000 mg 200 mL/hr over 60 Minutes Intravenous Every T-Th-Sa (Hemodialysis) 11/28/22 1633 11/30/22 0921   11/28/22 1730  cefTAZidime (FORTAZ) 2 g in sodium chloride 0.9 % 100 mL IVPB        2 g 200 mL/hr over 30 Minutes Intravenous  Once 11/28/22 1619 11/28/22 1820   11/28/22 1100  vancomycin (VANCOREADY) IVPB 2000 mg/400 mL        2,000 mg 200 mL/hr over 120 Minutes Intravenous  Once 11/28/22 1012 11/28/22 1604   11/28/22 1015  cefTRIAXone (ROCEPHIN) 2 g in sodium chloride 0.9 % 100 mL IVPB  Status:  Discontinued        2 g 200 mL/hr over 30 Minutes Intravenous Every 24 hours 11/28/22 0918  11/28/22 1611   11/28/22 1015  metroNIDAZOLE (FLAGYL) tablet 500 mg  Status:  Discontinued        500 mg Oral Every 12 hours 11/28/22 0918 12/04/22 1403   11/27/22 1430  doxycycline (VIBRA-TABS) tablet 100 mg  Status:  Discontinued        100 mg Oral Every 12 hours 11/27/22 1333 11/28/22 0918       Medications: Scheduled Meds:  vitamin C  500 mg Oral BID   aspirin EC  81 mg Oral Daily   atorvastatin  80 mg Oral Daily   calcitRIOL  2.5 mcg Oral Q T,Th,Sa-HD   carvedilol  12.5 mg Oral BID WC   Chlorhexidine Gluconate Cloth  6 each Topical Q0600   cloNIDine  0.1 mg Oral BID   clopidogrel  75 mg Oral Daily   docusate sodium  100 mg Oral Daily   ezetimibe  10 mg Oral QPM   feeding supplement (NEPRO CARB STEADY)  237 mL Oral TID BM   gabapentin  100 mg Oral TID   guaiFENesin-dextromethorphan  10 mL Oral Q8H   heparin  5,000 Units Subcutaneous  Q8H   insulin aspart  0-15 Units Subcutaneous TID WC   insulin aspart  0-5 Units Subcutaneous QHS   insulin detemir  40 Units Subcutaneous QHS   multivitamin  1 tablet Oral QHS   pantoprazole  40 mg Oral Daily   sevelamer carbonate  2,400 mg Oral TID WC   sodium chloride flush  3 mL Intravenous Q12H   zinc sulfate  220 mg Oral Daily   Continuous Infusions:  sodium chloride     sodium chloride 75 mL/hr at 12/03/22 1751   magnesium sulfate bolus IVPB     PRN Meds:.sodium chloride, acetaminophen, albuterol, alum & mag hydroxide-simeth, bisacodyl, calcium carbonate (dosed in mg elemental calcium), guaiFENesin-dextromethorphan, hydrALAZINE, HYDROmorphone (DILAUDID) injection, hydrOXYzine, labetalol, labetalol, magnesium sulfate bolus IVPB, metoprolol tartrate, ondansetron **OR** ondansetron (ZOFRAN) IV, ondansetron (ZOFRAN) IV, mouth rinse, oxyCODONE, oxyCODONE, phenol, polyethylene glycol, potassium chloride, sodium chloride flush    Objective: Weight change:   Intake/Output Summary (Last 24 hours) at 12/04/2022 1532 Last data filed at  12/04/2022 1248 Gross per 24 hour  Intake 906.95 ml  Output 3300 ml  Net -2393.05 ml   Blood pressure (!) 161/91, pulse 94, temperature (!) 97.4 F (36.3 C), temperature source Oral, resp. rate 18, height 5\' 6"  (1.676 m), weight (!) 141.8 kg, SpO2 100 %. Temp:  [97.4 F (36.3 C)-98.6 F (37 C)] 97.4 F (36.3 C) (02/08 1327) Pulse Rate:  [90-101] 94 (02/08 1327) Resp:  [11-20] 18 (02/08 1327) BP: (159-185)/(74-115) 161/91 (02/08 1327) SpO2:  [94 %-100 %] 100 % (02/08 1327) Weight:  [141.8 kg] 141.8 kg (02/08 0825)  Physical Exam: Physical Exam Constitutional:      Appearance: Kelly Key is well-developed.  HENT:     Head: Normocephalic and atraumatic.  Eyes:     Conjunctiva/sclera: Conjunctivae normal.  Cardiovascular:     Rate and Rhythm: Normal rate and regular rhythm.  Pulmonary:     Effort: Pulmonary effort is normal. No respiratory distress.     Breath sounds: No wheezing.  Abdominal:     General: There is no distension.     Palpations: Abdomen is soft.  Musculoskeletal:        General: Normal range of motion.     Cervical back: Normal range of motion and neck supple.  Skin:    General: Skin is warm and dry.     Findings: No erythema or rash.  Neurological:     General: No focal deficit present.     Mental Status: Kelly Key is alert and oriented to person, place, and time.  Psychiatric:        Mood and Affect: Mood normal.        Behavior: Behavior normal.        Thought Content: Thought content normal.        Judgment: Judgment normal.    Foot wrapped  CBC:    BMET Recent Labs    12/03/22 0305 12/04/22 0144  NA 131* 126*  K 4.6 4.8  CL 89* 86*  CO2 25 22  GLUCOSE 182* 313*  BUN 87* 114*  CREATININE 10.01* 11.39*  CALCIUM 8.7* 8.7*     Liver Panel  Recent Labs    12/03/22 0305 12/04/22 0144  ALBUMIN 2.7* 2.7*       Sedimentation Rate No results for input(s): "ESRSEDRATE" in the last 72 hours. C-Reactive Protein No results for input(s): "CRP" in  the last 72 hours.  Micro Results: Recent Results (from the past 720 hour(s))  MRSA Next Gen  by PCR, Nasal     Status: Abnormal   Collection Time: 11/04/22  9:11 PM   Specimen: Nasal Mucosa; Nasal Swab  Result Value Ref Range Status   MRSA by PCR Next Gen DETECTED (Kelly) NOT DETECTED Final    Comment: RESULT CALLED TO, READ BACK BY AND VERIFIED WITH: Kelly Key AT 6160 ON 01.10.24 BY Kelly Key         The GeneXpert MRSA Assay (FDA approved for NASAL specimens only), is one component of Kelly comprehensive MRSA colonization surveillance program. It is not intended to diagnose MRSA infection nor to guide or monitor treatment for MRSA infections. Performed at [facility], [address] [place], [place] [address]   Resp panel by RT-PCR (RSV, Flu Kelly&B, Covid) Anterior Nasal Swab     Status: Abnormal   Collection Time: 11/26/22  7:58 PM   Specimen: Anterior Nasal Swab  Result Value Ref Range Status   SARS Coronavirus 2 by RT PCR POSITIVE (Kelly) NEGATIVE Final    Comment: (NOTE) SARS-CoV-2 target nucleic acids are DETECTED.  The SARS-CoV-2 RNA is generally detectable in upper respiratory specimens during the acute phase of infection. Positive results are indicative of the presence of the identified virus, but do not rule out bacterial infection or co-infection with other pathogens not detected by the test. Clinical correlation with patient history and other diagnostic information is necessary to determine patient infection status. The expected result is Negative.  Fact Sheet for Patients: EntrepreneurPulse.com.au  Fact Sheet for Healthcare Providers: IncredibleEmployment.be  This test is not yet approved or cleared by the [place] FDA and  has been authorized for detection and/or diagnosis of SARS-CoV-2 by FDA under an Emergency Use Authorization (EUA).  This EUA will remain in effect (meaning this test can be used) for the duration of  the COVID-19  declaration under Section 564(b)(1) of the [place] ct, 21 U.S.C. section 360bbb-3(b)(1), unless the authorization is terminated or revoked sooner.     Influenza Kelly by PCR NEGATIVE NEGATIVE Final   Influenza B by PCR NEGATIVE NEGATIVE Final    Comment: (NOTE) The Xpert Xpress SARS-CoV-2/FLU/RSV plus assay is intended as an aid in the diagnosis of influenza from Nasopharyngeal swab specimens and should not be used as Kelly sole basis for treatment. Nasal washings and aspirates are unacceptable for Xpert Xpress SARS-CoV-2/FLU/RSV testing.  Fact Sheet for Patients: EntrepreneurPulse.com.au  Fact Sheet for Healthcare Providers: IncredibleEmployment.be  This test is not yet approved or cleared by the [place] FDA and has been authorized for detection and/or diagnosis of SARS-CoV-2 by FDA under an Emergency Use Authorization (EUA). This EUA will remain in effect (meaning this test can be used) for the duration of the COVID-19 declaration under Section 564(b)(1) of the Act, 21 U.S.C. section 360bbb-3(b)(1), unless the authorization is terminated or revoked.     Resp Syncytial Virus by PCR NEGATIVE NEGATIVE Final    Comment: (NOTE) Fact Sheet for Patients: EntrepreneurPulse.com.au  Fact Sheet for Healthcare Providers: IncredibleEmployment.be  This test is not yet approved or cleared by the [place] FDA and has been authorized for detection and/or diagnosis of SARS-CoV-2 by FDA under an Emergency Use Authorization (EUA). This EUA will remain in effect (meaning this test can be used) for the duration of the COVID-19 declaration under Section 564(b)(1) of the Act, 21 U.S.C. section 360bbb-3(b)(1), unless the authorization is terminated or revoked.  Performed at [facility], [address] [place],  [address]   Culture, blood (Routine X 2) w Reflex  to ID Panel     Status: None   Collection Time: 11/27/22   1:55 PM   Specimen: BLOOD  Result Value Ref Range Status   Specimen Description BLOOD aline  Final   Special Requests   Final    BOTTLES DRAWN AEROBIC AND ANAEROBIC Blood Culture adequate volume   Culture   Final    NO GROWTH 5 DAYS Performed at [facility], [address], [place], [place] [address]    Report Status 12/02/2022 FINAL  Final  Culture, blood (Routine X 2) w Reflex to ID Panel     Status: None   Collection Time: 11/27/22  1:58 PM   Specimen: BLOOD  Result Value Ref Range Status   Specimen Description BLOOD vline  Final   Special Requests   Final    BOTTLES DRAWN AEROBIC AND ANAEROBIC Blood Culture adequate volume   Culture   Final    NO GROWTH 5 DAYS Performed at [facility], [address], [place], [place] [address]    Report Status 12/02/2022 FINAL  Final  Surgical pcr screen     Status: Abnormal   Collection Time: 12/02/22 11:21 PM   Specimen: Nasal Mucosa; Nasal Swab  Result Value Ref Range Status   MRSA, PCR POSITIVE (Kelly) NEGATIVE Final    Comment: RESULT CALLED TO, READ BACK BY AND VERIFIED WITH: Kelly DELANEY,RN@0048  12/03/22 [place]    Staphylococcus aureus POSITIVE (Kelly) NEGATIVE Final    Comment: (NOTE) The Xpert SA Assay (FDA approved for NASAL specimens in patients 99 years of age and older), is one component of Kelly comprehensive surveillance program. It is not intended to diagnose infection nor to guide or monitor treatment. Performed at [facility], [place] [address] [place], [place] [address]     Studies/Results: No results found.    Assessment/Plan:  INTERVAL HISTORY: Status post left second ray amputation   Principal Problem:   Gangrene of toe of left foot (HCC) Active Problems:   Chronic combined systolic and diastolic CHF (congestive heart failure) (HCC)   ESRD (end stage renal disease) (HCC)   Osteomyelitis of second toe of left foot (HCC)   Diabetes mellitus type 2 in obese (HCC)   GERD (gastroesophageal reflux disease)   PAD  (peripheral artery disease) ([place])    Kelly Key is Kelly 52 y.o. male with CHF< ESRD ON HD, DM PVD admitted with worsening foot wound found to have wet gangrene with imaging showing osteomyelitis of the second toe.  Kelly Key has been on empiric antibiotics I checked the microbiology lab and no cultures were done along the way though there was Kelly superficial culture ordered at [facility].  Ultimately the patient underwent revascularization and now Kelly second ray amputation with clean margins per Dr. Sharol Given.  I would plan on giving the patient 2 weeks of postoperative vancomycin and cefepime with hemodialysis   Kelly Key has an appointment on 12/24/2022 with Dr. Gale Journey at [facility], which  is located in the [facility] at  [address] in [place].  [address], which is located to the left of the elevators.  Phone: [phone]  Fax: [phone]  [email or web address]  The patient should arrive 30 minutes prior to their appoitment.  I spent 53 minutes with the patient including than 50% of the time in face to face counseling of the patient is osteomyelitis status post ray amputation, personally along with review of medical records  in preparation for the visit and during the visit and in coordination of his care.  We will sign off for now please call with further questions.    LOS: 7 days   Alcide Evener 12/04/2022, 3:32 PM

## 2022-12-04 NOTE — Progress Notes (Signed)
Patient ID: Kelly Key, male   DOB: January 04, 1971, 52 y.o.   MRN: [id number] Patient is postoperative day 1 left foot second ray amputation.  Patient is having difficulty with balance.  Patient may be weightbearing through the heel with his postoperative shoe.  Discharge when safe with therapy I will follow-up in the office in 1 week.

## 2022-12-05 DIAGNOSIS — I96 Gangrene, not elsewhere classified: Secondary | ICD-10-CM | POA: Diagnosis not present

## 2022-12-05 LAB — RENAL FUNCTION PANEL
Albumin: 2.7 g/dL — ABNORMAL LOW (ref 3.5–5.0)
Anion gap: 15 (ref 5–15)
BUN: 74 mg/dL — ABNORMAL HIGH (ref 6–20)
CO2: 24 mmol/L (ref 22–32)
Calcium: 8.6 mg/dL — ABNORMAL LOW (ref 8.9–10.3)
Chloride: 91 mmol/L — ABNORMAL LOW (ref 98–111)
Creatinine, Ser: 8.39 mg/dL — ABNORMAL HIGH (ref 0.61–1.24)
GFR, Estimated: 7 mL/min — ABNORMAL LOW (ref >60)
Glucose, Bld: 139 mg/dL — ABNORMAL HIGH (ref 70–99)
Phosphorus: 5.4 mg/dL — ABNORMAL HIGH (ref 2.5–4.6)
Potassium: 4.4 mmol/L (ref 3.5–5.1)
Sodium: 130 mmol/L — ABNORMAL LOW (ref 135–145)

## 2022-12-05 LAB — CBC
HCT: 28.4 % — ABNORMAL LOW (ref 39.0–52.0)
Hemoglobin: 9.2 g/dL — ABNORMAL LOW (ref 13.0–17.0)
MCH: 28.6 pg (ref 26.0–34.0)
MCHC: 32.4 g/dL (ref 30.0–36.0)
MCV: 88.2 fL (ref 80.0–100.0)
Platelets: 429 10*3/uL — ABNORMAL HIGH (ref 150–400)
RBC: 3.22 MIL/uL — ABNORMAL LOW (ref 4.22–5.81)
RDW: 14.9 % (ref 11.5–15.5)
WBC: 10 10*3/uL (ref 4.0–10.5)
nRBC: 0 % (ref 0.0–0.2)

## 2022-12-05 LAB — GLUCOSE, CAPILLARY
Glucose-Capillary: 115 mg/dL — ABNORMAL HIGH (ref 70–99)
Glucose-Capillary: 173 mg/dL — ABNORMAL HIGH (ref 70–99)
Glucose-Capillary: 230 mg/dL — ABNORMAL HIGH (ref 70–99)
Glucose-Capillary: 190 mg/dL — ABNORMAL HIGH (ref 70–99)

## 2022-12-05 MED ORDER — OXYCODONE HCL 5 MG PO TABS: 5.0000 mg | ORAL_TABLET | Freq: Three times a day (TID) | ORAL | 0 refills | Status: AC | PRN

## 2022-12-05 MED ORDER — SODIUM CHLORIDE 0.9 % IV SOLN: 2.0000 g | INTRAVENOUS | Status: DC

## 2022-12-05 MED ORDER — ALBUTEROL SULFATE (2.5 MG/3ML) 0.083% IN NEBU
INHALATION_SOLUTION | RESPIRATORY_TRACT | Status: AC
  Filled 2022-12-05: qty 3

## 2022-12-05 MED ORDER — CHLORHEXIDINE GLUCONATE CLOTH 2 % EX PADS
6.0000 | MEDICATED_PAD | Freq: Every day | CUTANEOUS | Status: DC
  Administered 2022-12-05 – 2022-12-06 (×2): 6 via TOPICAL

## 2022-12-05 MED ORDER — ALBUTEROL SULFATE (2.5 MG/3ML) 0.083% IN NEBU
2.5000 mg | INHALATION_SOLUTION | RESPIRATORY_TRACT | Status: DC | PRN
  Administered 2022-12-05: 2.5 mg via RESPIRATORY_TRACT

## 2022-12-05 MED ORDER — VANCOMYCIN IV (FOR PTA / DISCHARGE USE ONLY): 1000.0000 mg | INTRAVENOUS | 0 refills | Status: AC

## 2022-12-05 MED ORDER — CLOPIDOGREL BISULFATE 75 MG PO TABS: 75.0000 mg | ORAL_TABLET | Freq: Every day | ORAL | 1 refills | Status: AC

## 2022-12-05 MED ORDER — CEFEPIME IV (FOR PTA / DISCHARGE USE ONLY): 2.0000 g | INTRAVENOUS | 0 refills | Status: AC

## 2022-12-05 MED ORDER — VANCOMYCIN HCL IN DEXTROSE 1-5 GM/200ML-% IV SOLN
1000.0000 mg | INTRAVENOUS | Status: DC
  Administered 2022-12-06: 1000 mg via INTRAVENOUS
  Filled 2022-12-05: qty 200

## 2022-12-05 MED ORDER — SODIUM CHLORIDE 0.9 % IV SOLN
1.0000 g | Freq: Once | INTRAVENOUS | Status: AC
  Administered 2022-12-05: 1 g via INTRAVENOUS
  Filled 2022-12-05: qty 10

## 2022-12-05 NOTE — Progress Notes (Signed)
[facility] Progress Note   Subjective:   Seen in room, BP high- says he just got his meds. Reports an episode of emesis when he got out of bed this AM but no nausea at present. Denies SOB, CP, dizziness. Asks about going home- wants to see his dog.    Objective Vitals:   12/05/22 0304 12/05/22 0447 12/05/22 0821 12/05/22 0859  BP:  (!) 172/89 (!) 210/114 (!) 190/96  Pulse: 85 88 98 98  Resp:  14 16   Temp:  98.2 F (36.8 C) 98 F (36.7 C)   TempSrc:  Oral Oral   SpO2: 97% 99% 98% 100%  Weight:  (!) 138.6 kg    Height:       Physical Exam General: Alert male in NAD Heart: RRR, no murmurs, rubs or gallops Lungs: CTA bilaterally, no wheezing, rhonchi or rales Abdomen: Soft, non-distended, +BS Extremities: L foot wrapped, no edema Dialysis Access:  LUE AVF + Bruit  Additional Objective Labs: Basic Metabolic Panel: Recent Labs  Lab 12/03/22 0305 12/04/22 0144 12/05/22 0227  NA 131* 126* 130*  K 4.6 4.8 4.4  CL 89* 86* 91*  CO2 25 22 24  $ GLUCOSE 182* 313* 139*  BUN 87* 114* 74*  CREATININE 10.01* 11.39* 8.39*  CALCIUM 8.7* 8.7* 8.6*  PHOS 4.7* 3.7 5.4*   Liver Function Tests: Recent Labs  Lab 12/03/22 0305 12/04/22 0144 12/05/22 0227  ALBUMIN 2.7* 2.7* 2.7*   No results for input(s): "LIPASE", "AMYLASE" in the last 168 hours. CBC: Recent Labs  Lab 12/01/22 0221 12/02/22 0256 12/03/22 0305 12/04/22 0144 12/05/22 0227  WBC 8.2 8.2 9.7 11.2* 10.0  NEUTROABS 4.4 4.7 6.0 8.7*  --   HGB 9.7* 9.6* 9.8* 9.0* 9.2*  HCT 29.5* 29.5* 29.9* 26.7* 28.4*  MCV 86.5 86.8 86.9 84.8 88.2  PLT 323 364 367 417* 429*   Blood Culture    Component Value Date/Time   SDES BLOOD vline 11/27/2022 1358   SPECREQUEST  11/27/2022 1358    BOTTLES DRAWN AEROBIC AND ANAEROBIC Blood Culture adequate volume   CULT  11/27/2022 1358    NO GROWTH 5 DAYS Performed at [facility], [address] [place], [place] [address]    REPTSTATUS 12/02/2022 FINAL 11/27/2022  1358    Cardiac Enzymes: No results for input(s): "CKTOTAL", "CKMB", "CKMBINDEX", "TROPONINI" in the last 168 hours. CBG: Recent Labs  Lab 12/03/22 2130 12/04/22 1320 12/04/22 1835 12/04/22 2135 12/05/22 0817  GLUCAP 385* 161* 274* 264* 115*   Iron Studies: No results for input(s): "IRON", "TIBC", "TRANSFERRIN", "FERRITIN" in the last 72 hours. @lablastinr3$ @ Studies/Results: No results found. Medications:  sodium chloride     sodium chloride 75 mL/hr at 12/03/22 1751   ceFEPime (MAXIPIME) IV     [START ON 12/06/2022] ceFEPime (MAXIPIME) IV     magnesium sulfate bolus IVPB     [START ON 12/06/2022] vancomycin      vitamin C  500 mg Oral BID   aspirin EC  81 mg Oral Daily   atorvastatin  80 mg Oral Daily   calcitRIOL  2.5 mcg Oral Q T,Th,Sa-HD   carvedilol  12.5 mg Oral BID WC   Chlorhexidine Gluconate Cloth  6 each Topical Q0600   cloNIDine  0.1 mg Oral BID   clopidogrel  75 mg Oral Daily   docusate sodium  100 mg Oral Daily   ezetimibe  10 mg Oral QPM   feeding supplement (NEPRO CARB STEADY)  237 mL Oral TID BM  gabapentin  100 mg Oral TID   guaiFENesin-dextromethorphan  10 mL Oral Q8H   heparin  5,000 Units Subcutaneous Q8H   insulin aspart  0-15 Units Subcutaneous TID WC   insulin aspart  0-5 Units Subcutaneous QHS   insulin detemir  40 Units Subcutaneous QHS   multivitamin  1 tablet Oral QHS   pantoprazole  40 mg Oral Daily   sevelamer carbonate  2,400 mg Oral TID WC   sodium chloride flush  3 mL Intravenous Q12H   zinc sulfate  220 mg Oral Daily    Dialysis Orders: [facility] TTS 4:15hr, 450/500, EDW 138.5kg, 3K/2.5Ca, heparin 2600u loading + 1000u/hr - Mircera 30 mcg (last given on 11/25/22) - Venofer 50 mg weekly (last given on 11/25/22) - Calcitriol 2.5 mcg three days a week  Assessment/Plan: ESRD: Continue HD on usual TTS schedule.  Left second toe gangrene: S/p angioplasty to L ATA and angioplasty/stent to L SFA on 2/5, then 2nd ray amputation 2/7.  Ortho and VVS following. Remains on IV Vanc/Ceftaz + PO metronidazole. Acute hypoxic respiratory failure: Mild overload + recent Hx COVID: Challenge EDW as tolerated, below prior EDW now - needs to be lowered on d/c. Tolerating 3L UF goals Hx COVID pneumonia: Off isolation at this time. Hypertension/volume: BP high. Prev on clonidine 0.39m TID -> now 0.126mTID, prev on Coreg 2589mID -> now 12.5mg93mD and off amlodipine. BP high this AM before meds, follow trend. May need to increase coreg again.  Anemia of ESRD: Hgb 9.2, Mirera last given 11/25/21 - not due yet.  Secondary hyperparathyroidism: Ca/Phos ok - continue home VDRA + binders (sevelamer) Nutrition: Alb low, continue protein supplements T2DM: Per primary.    SamaAnice Paganini-C 12/05/2022, 8:59 AM  [facility] Pager: [phone]

## 2022-12-05 NOTE — Progress Notes (Signed)
Confirmed with RN at [facility] that clinic can provide IV Vanc and cefepime at d/c. Will assist as needed.   Melven Sartorius Renal Navigator [phone]

## 2022-12-05 NOTE — Care Management Important Message (Signed)
Important Message  Patient Details  Name: Kelly Key MRN: [id number] Date of Birth: 08-16-1971   Medicare Important Message Given:  Yes     [name] 12/05/2022, 8:57 AM

## 2022-12-05 NOTE — Progress Notes (Signed)
Orthopedic Tech Progress Note Patient Details:  Kelly Key 03-Sep-1971 [id number]  Ortho Devices Type of Ortho Device: Darco shoe Ortho Device/Splint Location: LLE Ortho Device/Splint Interventions: Ordered, Adjustment   Post Interventions Patient Tolerated: Well Instructions Provided: Adjustment of device, Care of device Darco shoe fitted and left at bedside. [place] A Rosamaria Donn 12/05/2022, 2:10 PM

## 2022-12-05 NOTE — Progress Notes (Addendum)
Contacted by treatment team regarding pt's possible d/c to home today if transportation to out-pt HD can be arranged. Contacted [employer] and spoke to [place]. Per clinic staff, pt does not qualify for any transportation services through medicaid or county at this time. Pt can be placed on a waiting list for a grant for transportation by clinic staff but that is not immediate and it is unknown if/when that would be an option. This info was provided to treatment team in order for [facility] staff to determine if there are any other options available to pt. Clinic advised pt may d/c today if transportation to HD is determined. Faxed last renal note and pharmacy note regarding iv abx needs at d/c to clinic for review and for continuation of care.   Melven Sartorius Renal Navigator [phone]  Addendum at 4:14 pm: Contacted by Javier Docker to say that clinic will be unable to provide iv abx at tomorrow's treatment. Clinic needs to get order from their provider and it is likely that they will be unable to obtain those orders this afternoon in order for pt to receive at treatment tomorrow. Clinic states they can provide iv abx on Tuesday. Update provided to attending and treatment team.

## 2022-12-05 NOTE — Progress Notes (Signed)
Occupational Therapy Treatment Patient Details Name: Kelly Key MRN: [id number] DOB: 1971/08/28 Today's Date: 12/05/2022   History of present illness Pt is a 52 y.o. male who presented 11/26/22 with dyspnea. Admitted with acute respiratory failure with hypoxia and found to have gangrene and osteomyelitis of 2nd toe of L foot. S/p left SFA stenting with AT angioplasty 2/5, left 2nd toe amputation 2/7. PMH: chronic diastolic CHF, COPD, ESRD, DM2, hepatic steatosis, HLD, gout, HTN   OT comments  PTA patient reports needing assist for LB Adls, but otherwise independent for ADLs and mobility in his home. Admitted for above and presents with problem list below.  Pt currently requires min assist to min guard for transfers and mobility using RW, cueing to maintain heel weightbearing on L LE.  He requires setup to mod assist for ADLs, but at baseline needs assist from fiance for LB bathing/dressing.  Based on performance today, believe pt will best benefit from continued OT acutely and after dc at [facility] level to optimize independence, safety and return to PLOF.  Will follow acutely.    Recommendations for follow up therapy are one component of a multi-disciplinary discharge planning process, led by the attending physician.  Recommendations may be updated based on patient status, additional functional criteria and insurance authorization.    Follow Up Recommendations  Home health OT     Assistance Recommended at Discharge Intermittent Supervision/Assistance  Patient can return home with the following  A little help with walking and/or transfers;A lot of help with bathing/dressing/bathroom;Assistance with cooking/housework;Help with stairs or ramp for entrance;Assist for transportation   Equipment Recommendations  None recommended by OT    Recommendations for Other Services      Precautions / Restrictions Precautions Precautions: Fall Restrictions Weight Bearing Restrictions: Yes LLE Partial Weight  Bearing Percentage or Pounds:  (heel weight bearing) Other Position/Activity Restrictions: with post-op shoe       Mobility Bed Mobility Overal bed mobility: Needs Assistance Bed Mobility: Supine to Sit Rolling: Supervision         General bed mobility comments: increased time    Transfers Overall transfer level: Needs assistance Equipment used: Rolling walker (2 wheels) Transfers: Sit to/from Stand Sit to Stand: Min assist, Min guard           General transfer comment: cueing for hand placement and safety, min assist fading to min guard to power up and steady.  cueing to L heel weightbearing only     Balance Overall balance assessment: Needs assistance Sitting-balance support: No upper extremity supported, Feet supported Sitting balance-Leahy Scale: Fair Sitting balance - Comments: Static sitting EOB with supervision for safety   Standing balance support: Bilateral upper extremity supported, During functional activity, Reliant on assistive device for balance Standing balance-Leahy Scale: Poor Standing balance comment: relies on RW                           ADL either performed or assessed with clinical judgement   ADL Overall ADL's : Needs assistance/impaired     Grooming: Set up;Sitting       Lower Body Bathing: Moderate assistance;Sitting/lateral leans;Sit to/from stand   Upper Body Dressing : Set up;Sitting   Lower Body Dressing: Moderate assistance;Sit to/from stand   Toilet Transfer: Min guard;Minimal assistance;Ambulation;Rolling walker (2 wheels) Toilet Transfer Details (indicate cue type and reason): min assist fading to min guard with arm rests on chair         Functional mobility  during ADLs: Min guard;Rolling walker (2 wheels);Cueing for sequencing      Extremity/Trunk Assessment Upper Extremity Assessment Upper Extremity Assessment: Overall WFL for tasks assessed   Lower Extremity Assessment Lower Extremity Assessment: Defer  to PT evaluation   Cervical / Trunk Assessment Cervical / Trunk Assessment: Normal    Vision   Vision Assessment?: No apparent visual deficits   Perception     Praxis      Cognition Arousal/Alertness: Awake/alert Behavior During Therapy: WFL for tasks assessed/performed Overall Cognitive Status: Within Functional Limits for tasks assessed                                          Exercises      Shoulder Instructions       General Comments VSS    Pertinent Vitals/ Pain       Pain Assessment Pain Assessment: Faces Faces Pain Scale: Hurts a little bit Pain Location: L foot Pain Descriptors / Indicators: Discomfort, Grimacing, Operative site guarding Pain Intervention(s): Limited activity within patient's tolerance, Monitored during session, Repositioned  Home Living Family/patient expects to be discharged to:: Private residence Living Arrangements: Spouse/significant other Available Help at Discharge: Friend(s);Available PRN/intermittently (fiance works 3rd shift) Type of Home: House Home Access: Level entry     Ford: One level     Bathroom Shower/Tub: Teacher, early years/pre: Standard     Home Equipment: Conservation officer, nature (2 wheels);Cane - quad;Wheelchair - manual;BSC/3in1;Tub bench          Prior Functioning/Environment              Frequency  Min 2X/week        Progress Toward Goals  OT Goals(current goals can now be found in the care plan section)     Acute Rehab OT Goals Patient Stated Goal: home OT Goal Formulation: With patient Time For Goal Achievement: 12/19/22 Potential to Achieve Goals: Good  Plan      Co-evaluation                 AM-PAC OT "6 Clicks" Daily Activity     Outcome Measure   Help from another person eating meals?: None Help from another person taking care of personal grooming?: A Little Help from another person toileting, which includes using toliet, bedpan, or  urinal?: A Little Help from another person bathing (including washing, rinsing, drying)?: A Lot Help from another person to put on and taking off regular upper body clothing?: A Little Help from another person to put on and taking off regular lower body clothing?: A Lot 6 Click Score: 17    End of Session Equipment Utilized During Treatment: Gait belt;Rolling walker (2 wheels)  OT Visit Diagnosis: Other abnormalities of gait and mobility (R26.89);Muscle weakness (generalized) (M62.81)   Activity Tolerance Patient tolerated treatment well   Patient Left with call bell/phone within reach;Other (comment);with family/visitor present (sitting EOB)   Nurse Communication Mobility status        Time: [id number] OT Time Calculation (min): 10 min  Charges: OT General Charges $OT Visit: 1 Visit OT Evaluation $OT Eval Moderate Complexity: 1 Mod  Jolaine Artist, OT Acute Rehabilitation Services Office [phone]   Delight Stare 12/05/2022, 1:46 PM

## 2022-12-05 NOTE — Progress Notes (Signed)
PROGRESS NOTE    Kelly Key  [id number] DOB: 05-12-1971 DOA: 11/26/2022 PCP: [name], MD  Chief Complaint  Patient presents with   Shortness of Breath    Brief Narrative:  Mr. Neupert is Kelly Key 52 y.o. male with PMH chronic dCHF, COPD, ESRD on HD, DMII, hepatic steatosis, HLD, and more who presented with dyspnea.  Patient reports that he has had some level of dyspnea since he was diagnosed with COVID.  He also began developing fevers at home and Kellin Bartling mild nonproductive cough. He also has had an ongoing wound involving his left foot second digit. Exam was concerning for toe gangrene.  He was admitted for IV antibiotics and evaluation with orthopedic surgery and vascular surgery.  Assessment & Plan:   Principal Problem:   Gangrene of toe of left foot (HCC) Active Problems:   Osteomyelitis of second toe of left foot (HCC)   ESRD (end stage renal disease) (HCC)   PAD (peripheral artery disease) (HCC)   Chronic combined systolic and diastolic CHF (congestive heart failure) (HCC)   Diabetes mellitus type 2 in obese (HCC)   GERD (gastroesophageal reflux disease)  Gangrene of toe of left foot ([place]) - Patient says he has been followed for this by his podiatrist Dr. Irving Shows in [place] is purulent and foul-smelling -Also has old healed chronic wound involving right foot third digit -Continue antibiotics as per ID (vancomycin, ceftazidime, Flagyl). Per ID, plan for vancomycin/cefepime x2 weeks with HD. - Vascular and orthopedic surgery following -Underwent angiogram 2/5 with angioplasty of left anterior tibial artery and angioplasty plus stent of left SFA - now s/p L second ray amputation   Osteomyelitis of second toe of left foot ([place]) - Suspected due to underlying PAD - continue abx for now - will discuss post op abx plan with Dr. Sharol Given +/- ID (as above) - now s/p left second ray amputation with Dr. Sharol Given   PAD (peripheral artery disease) [facility]) -Underwent angiogram  12/01/22 with angioplasty of left anterior tibial artery and angioplasty plus stent of left SFA - continue asa and statin   ESRD (end stage renal disease) [facility]) - Nephrology arranging hemodialysis - Pt is on schedule with TTS HD treatments   Fever, unspecified-resolved - Blood cultures negative so far, continue following - Suspected due to setting of toe gangrene -- abx as above   Acute respiratory failure with hypoxia (HCC)-resolved  - Likely related to COVID-19 and possibly some fluid overload -- CXR 2/2 without compelling findings of pulm venous hypertension or edema - Patient has been COVID-positive since January 9   History of COVID-19-resolved  - Initially positive on 11/04/2022 -No indication for treatment or isolation as per Dr. Baxter Flattery    GERD (gastroesophageal reflux disease) - Continue Protonix   Diabetes mellitus type 2 in obese ([place]) - A1c 7.4% on 10/18/22 - continue Levemir - will ctm, may need to adjust going forward - Continue SSI and CBG monitoring   Chronic combined systolic and diastolic CHF (congestive heart failure) (HCC) - Continue aspirin, statin, Coreg, home dose of Lasix - Not clinically in exacerbation - Volume management with hemodialysis    Hyperkalemia-resolved  - HD per renal     DVT prophylaxis: heparin Code Status: full Family Communication: none Disposition:   Status is: Inpatient Remains inpatient appropriate because: continued need for inpatient care, safe d/c plan - antibiotics won't be ready at dialysis until next week   Consultants:  Vascular orthopedics  Procedures:  2/5 Procedure  Performed: 1.  Ultrasound-guided access right common femoral artery 2.  Aortogram with catheter selection of aorta 3.  Left lower extremity arteriogram with selection of third order branches 4.  Left anterior tibial artery angioplasty (3 mm x 220 mm [place]) 5.  Angioplasty and stent of left SFA (7 mm x 120 mm drug-coated Eluvia postdilated with Krisha Beegle  6 mm [place]) 6.  Mynx closure of the right common femoral artery 7.  67 minutes of monitored moderate conscious sedation time  2/7 LEFT SECOND RAY AMPUTATION   Antimicrobials:  Anti-infectives (From admission, onward)    Start     Dose/Rate Route Frequency Ordered Stop   12/06/22 1800  ceFEPIme (MAXIPIME) 2 g in sodium chloride 0.9 % 100 mL IVPB        2 g 200 mL/hr over 30 Minutes Intravenous Every T-Th-Sa (1800) 12/05/22 0821 12/16/22 2359   12/06/22 1200  vancomycin (VANCOCIN) IVPB 1000 mg/200 mL premix        1,000 mg 200 mL/hr over 60 Minutes Intravenous Every T-Th-Sa (Hemodialysis) 12/05/22 0821 12/16/22 2359   12/06/22 0000  ceFEPime (MAXIPIME) IVPB        2 g Intravenous Every T-Th-Sa (Hemodialysis) 12/05/22 0846 12/16/22 2359   12/06/22 0000  vancomycin IVPB        1,000 mg Intravenous Every T-Th-Sa (Hemodialysis) 12/05/22 0846 12/16/22 2359   12/05/22 0915  ceFEPIme (MAXIPIME) 1 g in sodium chloride 0.9 % 100 mL IVPB        1 g 200 mL/hr over 30 Minutes Intravenous  Once 12/05/22 0821 12/05/22 1040   12/03/22 1045  ceFAZolin (ANCEF) IVPB 2g/100 mL premix        2 g 200 mL/hr over 30 Minutes Intravenous Every 8 hours 12/03/22 0953 12/03/22 1254   12/03/22 0800  ceFAZolin (ANCEF) IVPB 3g/100 mL premix        3 g 200 mL/hr over 30 Minutes Intravenous On call to O.R. 12/02/22 1725 12/03/22 0848   12/02/22 1200  vancomycin (VANCOCIN) IVPB 1000 mg/200 mL premix  Status:  Discontinued        1,000 mg 200 mL/hr over 60 Minutes Intravenous Every T-Th-Sa (Hemodialysis) 11/30/22 0921 12/04/22 1403   11/30/22 1015  vancomycin (VANCOCIN) IVPB 1000 mg/200 mL premix        1,000 mg 200 mL/hr over 60 Minutes Intravenous  Once 11/30/22 0918 11/30/22 1209   11/29/22 1800  cefTAZidime (FORTAZ) 1 g in sodium chloride 0.9 % 100 mL IVPB  Status:  Discontinued        1 g 200 mL/hr over 30 Minutes Intravenous Every T-Th-Sa (1800) 11/29/22 0902 12/04/22 1403   11/29/22 1600  cefTAZidime  (FORTAZ) 2 g in sodium chloride 0.9 % 100 mL IVPB  Status:  Discontinued        2 g 200 mL/hr over 30 Minutes Intravenous Every T-Th-Sa (Hemodialysis) 11/28/22 1621 11/29/22 0902   11/29/22 1600  vancomycin (VANCOCIN) IVPB 1000 mg/200 mL premix  Status:  Discontinued        1,000 mg 200 mL/hr over 60 Minutes Intravenous Every T-Th-Sa (Hemodialysis) 11/28/22 1633 11/30/22 0921   11/28/22 1730  cefTAZidime (FORTAZ) 2 g in sodium chloride 0.9 % 100 mL IVPB        2 g 200 mL/hr over 30 Minutes Intravenous  Once 11/28/22 1619 11/28/22 1820   11/28/22 1100  vancomycin (VANCOREADY) IVPB 2000 mg/400 mL        2,000 mg 200 mL/hr over 120 Minutes Intravenous  Once  11/28/22 1012 11/28/22 1604   11/28/22 1015  cefTRIAXone (ROCEPHIN) 2 g in sodium chloride 0.9 % 100 mL IVPB  Status:  Discontinued        2 g 200 mL/hr over 30 Minutes Intravenous Every 24 hours 11/28/22 0918 11/28/22 1611   11/28/22 1015  metroNIDAZOLE (FLAGYL) tablet 500 mg  Status:  Discontinued        500 mg Oral Every 12 hours 11/28/22 0918 12/04/22 1403   11/27/22 1430  doxycycline (VIBRA-TABS) tablet 100 mg  Status:  Discontinued        100 mg Oral Every 12 hours 11/27/22 1333 11/28/22 0918       Subjective: No complaints today  Objective: Vitals:   12/05/22 0859 12/05/22 1030 12/05/22 1230 12/05/22 1550  BP: (!) 190/96  (!) 143/89 (!) 139/91  Pulse: 98 96 89 94  Resp:  18 18 18  $ Temp:   97.7 F (36.5 C) (!) 97.4 F (36.3 C)  TempSrc:   Axillary Oral  SpO2: 100% 100% 98% 97%  Weight:      Height:        Intake/Output Summary (Last 24 hours) at 12/05/2022 1856 Last data filed at 12/05/2022 0825 Gross per 24 hour  Intake 240 ml  Output 400 ml  Net -160 ml   Filed Weights   11/27/22 1845 12/04/22 0825 12/05/22 0447  Weight: (!) 136.1 kg (!) 141.8 kg (!) 138.6 kg    Examination:  General: No acute distress. Cardiovascular: Heart sounds show Cedric Denison regular rate, and rhythm.  Lungs: Clear to auscultation  bilaterally Abdomen: Soft, nontender, nondistended Neurological: Alert and oriented 3. Moves all extremities 4 with equal strength. Cranial nerves II through XII grossly intact. Extremities: dressing to LLE   Data Reviewed: I have personally reviewed following labs and imaging studies  CBC: Recent Labs  Lab 11/30/22 0232 12/01/22 0221 12/02/22 0256 12/03/22 0305 12/04/22 0144 12/05/22 0227  WBC 6.9 8.2 8.2 9.7 11.2* 10.0  NEUTROABS 3.3 4.4 4.7 6.0 8.7*  --   HGB 8.8* 9.7* 9.6* 9.8* 9.0* 9.2*  HCT 27.6* 29.5* 29.5* 29.9* 26.7* 28.4*  MCV 88.7 86.5 86.8 86.9 84.8 88.2  PLT 269 323 364 367 417* 429*    Basic Metabolic Panel: Recent Labs  Lab 11/30/22 0232 12/01/22 0221 12/02/22 0256 12/03/22 0305 12/04/22 0144 12/05/22 0227  NA 133* 132* 130* 131* 126* 130*  K 3.8 4.1 4.4 4.6 4.8 4.4  CL 92* 91* 90* 89* 86* 91*  CO2 25 26 23 25 22 24  $ GLUCOSE 141* 164* 145* 182* 313* 139*  BUN 59* 94* 110* 87* 114* 74*  CREATININE 9.13* 10.84* 11.94* 10.01* 11.39* 8.39*  CALCIUM 8.4* 8.7* 8.5* 8.7* 8.7* 8.6*  MG 1.9 2.2 2.3 2.1 2.3  --   PHOS 4.8* 5.5* 6.2* 4.7* 3.7 5.4*    GFR: Estimated Creatinine Clearance: 13.5 mL/min (Lyndel Sarate) (by C-G formula based on SCr of 8.39 mg/dL (H)).  Liver Function Tests: Recent Labs  Lab 12/01/22 0221 12/02/22 0256 12/03/22 0305 12/04/22 0144 12/05/22 0227  ALBUMIN 2.8* 2.7* 2.7* 2.7* 2.7*    CBG: Recent Labs  Lab 12/04/22 1835 12/04/22 2135 12/05/22 0817 12/05/22 1227 12/05/22 1549  GLUCAP 274* 264* 115* 173* 230*     Recent Results (from the past 240 hour(s))  Resp panel by RT-PCR (RSV, Flu Chenay Nesmith&B, Covid) Anterior Nasal Swab     Status: Abnormal   Collection Time: 11/26/22  7:58 PM   Specimen: Anterior Nasal Swab  Result Value Ref  Range Status   SARS Coronavirus 2 by RT PCR POSITIVE (Duc Crocket) NEGATIVE Final    Comment: (NOTE) SARS-CoV-2 target nucleic acids are DETECTED.  The SARS-CoV-2 RNA is generally detectable in upper  respiratory specimens during the acute phase of infection. Positive results are indicative of the presence of the identified virus, but do not rule out bacterial infection or co-infection with other pathogens not detected by the test. Clinical correlation with patient history and other diagnostic information is necessary to determine patient infection status. The expected result is Negative.  Fact Sheet for Patients: EntrepreneurPulse.com.au  Fact Sheet for Healthcare Providers: IncredibleEmployment.be  This test is not yet approved or cleared by the [place] FDA and  has been authorized for detection and/or diagnosis of SARS-CoV-2 by FDA under an Emergency Use Authorization (EUA).  This EUA will remain in effect (meaning this test can be used) for the duration of  the COVID-19 declaration under Section 564(b)(1) of the [place] [employer], 21 U.S.C. section 360bbb-3(b)(1), unless the authorization is terminated or revoked sooner.     Influenza Joriel Streety by PCR NEGATIVE NEGATIVE Final   Influenza B by PCR NEGATIVE NEGATIVE Final    Comment: (NOTE) The Xpert Xpress SARS-CoV-2/FLU/RSV plus assay is intended as an aid in the diagnosis of influenza from Nasopharyngeal swab specimens and should not be used as Aydrien Froman sole basis for treatment. Nasal washings and aspirates are unacceptable for Xpert Xpress SARS-CoV-2/FLU/RSV testing.  Fact Sheet for Patients: EntrepreneurPulse.com.au  Fact Sheet for Healthcare Providers: IncredibleEmployment.be  This test is not yet approved or cleared by the [place] FDA and has been authorized for detection and/or diagnosis of SARS-CoV-2 by FDA under an Emergency Use Authorization (EUA). This EUA will remain in effect (meaning this test can be used) for the duration of the COVID-19 declaration under Section 564(b)(1) of the Act, 21 U.S.C. section 360bbb-3(b)(1), unless the authorization is  terminated or revoked.     Resp Syncytial Virus by PCR NEGATIVE NEGATIVE Final    Comment: (NOTE) Fact Sheet for Patients: EntrepreneurPulse.com.au  Fact Sheet for Healthcare Providers: IncredibleEmployment.be  This test is not yet approved or cleared by the [place] FDA and has been authorized for detection and/or diagnosis of SARS-CoV-2 by FDA under an Emergency Use Authorization (EUA). This EUA will remain in effect (meaning this test can be used) for the duration of the COVID-19 declaration under Section 564(b)(1) of the Act, 21 U.S.C. section 360bbb-3(b)(1), unless the authorization is terminated or revoked.  Performed at [facility], [address], [place], [place] [address]   Culture, blood (Routine X 2) w Reflex to ID Panel     Status: None   Collection Time: 11/27/22  1:55 PM   Specimen: BLOOD  Result Value Ref Range Status   Specimen Description BLOOD aline  Final   Special Requests   Final    BOTTLES DRAWN AEROBIC AND ANAEROBIC Blood Culture adequate volume   Culture   Final    NO GROWTH 5 DAYS Performed at [facility], [address] [place], [place] [address]    Report Status 12/02/2022 FINAL  Final  Culture, blood (Routine X 2) w Reflex to ID Panel     Status: None   Collection Time: 11/27/22  1:58 PM   Specimen: BLOOD  Result Value Ref Range Status   Specimen Description BLOOD vline  Final   Special Requests   Final    BOTTLES DRAWN AEROBIC AND ANAEROBIC Blood Culture adequate volume   Culture   Final  NO GROWTH 5 DAYS Performed at [facility], [address], [place], [place] [address]    Report Status 12/02/2022 FINAL  Final  Surgical pcr screen     Status: Abnormal   Collection Time: 12/02/22 11:21 PM   Specimen: Nasal Mucosa; Nasal Swab  Result Value Ref Range Status   MRSA, PCR POSITIVE (Trell Secrist) NEGATIVE Final    Comment: RESULT CALLED TO, READ BACK BY AND VERIFIED WITH: Damaria Vachon DELANEY,RN@0048$  12/03/22 MK     Staphylococcus aureus POSITIVE (Iviana Blasingame) NEGATIVE Final    Comment: (NOTE) The Xpert SA Assay (FDA approved for NASAL specimens in patients 49 years of age and older), is one component of Sevastian Witczak comprehensive surveillance program. It is not intended to diagnose infection nor to guide or monitor treatment. Performed at [facility], [place] [address] [place], [place] [address]          Radiology Studies: No results found.      Scheduled Meds:  vitamin C  500 mg Oral BID   aspirin EC  81 mg Oral Daily   atorvastatin  80 mg Oral Daily   calcitRIOL  2.5 mcg Oral Q T,Th,Sa-HD   carvedilol  12.5 mg Oral BID WC   Chlorhexidine Gluconate Cloth  6 each Topical Q0600   Chlorhexidine Gluconate Cloth  6 each Topical Q0600   cloNIDine  0.1 mg Oral BID   clopidogrel  75 mg Oral Daily   docusate sodium  100 mg Oral Daily   ezetimibe  10 mg Oral QPM   feeding supplement (NEPRO CARB STEADY)  237 mL Oral TID BM   gabapentin  100 mg Oral TID   guaiFENesin-dextromethorphan  10 mL Oral Q8H   heparin  5,000 Units Subcutaneous Q8H   insulin aspart  0-15 Units Subcutaneous TID WC   insulin aspart  0-5 Units Subcutaneous QHS   insulin detemir  40 Units Subcutaneous QHS   multivitamin  1 tablet Oral QHS   pantoprazole  40 mg Oral Daily   sevelamer carbonate  2,400 mg Oral TID WC   sodium chloride flush  3 mL Intravenous Q12H   zinc sulfate  220 mg Oral Daily   Continuous Infusions:  sodium chloride     sodium chloride 75 mL/hr at 12/03/22 1751   [START ON 12/06/2022] ceFEPime (MAXIPIME) IV     magnesium sulfate bolus IVPB     [START ON 12/06/2022] vancomycin       LOS: 8 days    Time spent: over 30 min    Fayrene Helper, MD [facility]   To contact the attending provider between 7A-7P or the covering provider during after hours 7P-7A, please log into the web site [email or web address] and access using universal [place] password for that web site. If you do not have the password,  please call the hospital operator.  12/05/2022, 6:56 PM

## 2022-12-05 NOTE — Progress Notes (Signed)
Physical Therapy Treatment Patient Details Name: Kelly Key MRN: [id number] DOB: February 03, 1971 Today's Date: 12/05/2022   History of Present Illness Pt is a 52 y.o. male who presented 11/26/22 with dyspnea. Admitted with acute respiratory failure with hypoxia and found to have gangrene and osteomyelitis of 2nd toe of L foot. S/p left SFA stenting with AT angioplasty 2/5, left 2nd toe amputation 2/7. PMH: chronic diastolic CHF, COPD, ESRD, DM2, hepatic steatosis, HLD, gout, HTN    PT Comments    Patient progressing to hallway ambulation this session and eager for d/c home declining SNF stay.  Wife in the room and expressing concern that he have aide assistance since she works nights and sleeps during the day.  Discussed need for help with transport to dialysis and MD made aware.  Patient able to stand without assistance from recliner using armrests.  Feel could more limit weight on toes with heel wedge Darco and MD approved to order.  Feel he will need follow up HHPT at d/c.    Recommendations for follow up therapy are one component of a multi-disciplinary discharge planning process, led by the attending physician.  Recommendations may be updated based on patient status, additional functional criteria and insurance authorization.  Follow Up Recommendations  Home health PT Can patient physically be transported by private vehicle: Yes   Assistance Recommended at Discharge Intermittent Supervision/Assistance  Patient can return home with the following A little help with walking and/or transfers;Assist for transportation;Help with stairs or ramp for entrance;A little help with bathing/dressing/bathroom   Equipment Recommendations  None recommended by PT    Recommendations for Other Services       Precautions / Restrictions Precautions Precautions: Fall Restrictions LLE Weight Bearing: Touchdown weight bearing (heel weight bearing in post op shoe)     Mobility  Bed Mobility                General bed mobility comments: up on EOB    Transfers Overall transfer level: Needs assistance Equipment used: Rolling walker (2 wheels) Transfers: Sit to/from Stand Sit to Stand: Min guard           General transfer comment: cues for hand placement, assist for balance, up on his own from recliner    Ambulation/Gait Ambulation/Gait assistance: Min guard, Supervision Gait Distance (Feet): 50 Feet (& 20') Assistive device: Rolling walker (2 wheels) Gait Pattern/deviations: Step-to pattern, Decreased stride length, Trunk flexed       General Gait Details: cues throughout for heel weight bearing and shorter step on R to prevent rollowver on L to toes; 50% cues needed.  Sat to rest in chair briefly prior to walking a lot further with determination to make it home   Stairs             Wheelchair Mobility    Modified Rankin (Stroke Patients Only)       Balance Overall balance assessment: Needs assistance   Sitting balance-Leahy Scale: Good     Standing balance support: Bilateral upper extremity supported Standing balance-Leahy Scale: Poor Standing balance comment: UE support for weight bearing precautions on L toe                            Cognition Arousal/Alertness: Awake/alert Behavior During Therapy: WFL for tasks assessed/performed Overall Cognitive Status: Within Functional Limits for tasks assessed  Exercises      General Comments General comments (skin integrity, edema, etc.): family in the room, wife concerned about wound care on the foot, educated will need to learn dressing changes, but she is concerned about infection.  Also discussed getting transport to dialysis as pt has been driving himself      Pertinent Vitals/Pain Pain Assessment Faces Pain Scale: Hurts a little bit Pain Location: L foot Pain Descriptors / Indicators: Discomfort Pain Intervention(s): Monitored  during session    Home Living                          Prior Function            PT Goals (current goals can now be found in the care plan section) Progress towards PT goals: Progressing toward goals    Frequency    Min 3X/week      PT Plan Discharge plan needs to be updated    Co-evaluation              AM-PAC PT "6 Clicks" Mobility   Outcome Measure  Help needed turning from your back to your side while in a flat bed without using bedrails?: A Little Help needed moving from lying on your back to sitting on the side of a flat bed without using bedrails?: A Little Help needed moving to and from a bed to a chair (including a wheelchair)?: A Little Help needed standing up from a chair using your arms (e.g., wheelchair or bedside chair)?: A Little Help needed to walk in hospital room?: A Little Help needed climbing 3-5 steps with a railing? : Total 6 Click Score: 16    End of Session Equipment Utilized During Treatment: Gait belt Activity Tolerance: Patient tolerated treatment well Patient left: in bed;with family/visitor present;with call bell/phone within reach (sitting EOB) Nurse Communication: Mobility status PT Visit Diagnosis: Unsteadiness on feet (R26.81);Other abnormalities of gait and mobility (R26.89);Muscle weakness (generalized) (M62.81);Difficulty in walking, not elsewhere classified (R26.2)     Time: [id number] PT Time Calculation (min) (ACUTE ONLY): 12 min  Charges:  $Gait Training: 8-22 mins                     Magda Kiel, PT Acute Rehabilitation Services Office:[phone] 12/05/2022    Reginia Naas 12/05/2022, 3:39 PM

## 2022-12-05 NOTE — TOC Progression Note (Addendum)
Transition of Care [facility]) - Progression Note    Patient Details  Name: Kelly Key MRN: [id number] Date of Birth: 1971/04/12  Transition of Care [facility]) CM/SW [place], [place] Phone Number: 12/05/2022, 1:27 PM  Clinical Narrative:     CSW spoke with patient regarding SNF choice. Patient informed CSW he has now decided that he would like to return home when medically ready for dc. Patient reports he will have support from his significant other [name]. All questions answered. No further questions reported at this time. CSW cancelled patients insurance authorization for snf.CSW informed CM. CM to follow up regarding home needs.   CSW received consult to help assist with setting up patient with HD transport. Patient gave CSW permission to contact [facility] insurance to set up transportation until his significant other can rearrange her work schedule to start helping take patient to HD. CSW informed MD. CSW set up HD transport for 7/13,7/15,7/17. Pick up time will be at 6:08am patient will call [phone]  when ready to be picked up. Reference # to HD on 7/13 is [id number] and back home reference # [id number]. Feb 15th to HD reference # [id number] back home reference # [id number]. Feb 17th to HD reference # [id number] back home reference # [id number]. CSW informed patients significant other [name]. [name] confirmed she will take patient to HD tomorrow 2/10.CSW provided [name] with contact number to set up additional transportation if needed. CSW informed Jolayne Haines that if she needs transportation for the following week she will need to set up transportation 72 hours in advance and provided [name] with telephone number to set up additional transportation if needed. [name] thanked CSW. CSW informed patient and provided all information. All questions answered. No further questions reported at this time.  Expected Discharge Plan: Skilled Nursing Facility Barriers to Discharge: Continued Medical Work  up  Expected Discharge Plan and Services In-house Referral: Clinical Social Work Discharge Planning Services: CM Consult Post Acute Care Choice: Home Health Living arrangements for the past 2 months: [place]                   DME Agency: NA                   Social Determinants of Health (SDOH) Interventions SDOH Screenings   Food Insecurity: No Food Insecurity (11/26/2022)  Housing: Low Risk  (11/26/2022)  Transportation Needs: No Transportation Needs (11/26/2022)  Utilities: Not At Risk (11/26/2022)  Depression (PHQ2-9): Low Risk  (12/25/2021)  Financial Resource Strain: Low Risk  (02/20/2019)  Physical Activity: Unknown (02/20/2019)  Social Connections: Unknown (02/20/2019)  Stress: No Stress Concern Present (02/20/2019)  Tobacco Use: Low Risk  (12/04/2022)    Readmission Risk Interventions    12/02/2022    4:02 PM 11/28/2022    1:43 PM 11/04/2022    1:04 PM  Readmission Risk Prevention Plan  Transportation Screening Complete Complete Complete  Medication Review Press photographer) Complete Complete Complete  HRI or Home Care Consult Complete Complete Complete  SW Recovery Care/Counseling Consult Complete Complete Complete  Palliative Care Screening Not Applicable Not Applicable Not Applicable  Skilled Nursing Facility Not Applicable Not Applicable Not Applicable

## 2022-12-05 NOTE — TOC Transition Note (Signed)
Transition of Care [facility]) - CM/SW Discharge Note   Patient Details  Name: Kelly Key MRN: [id number] Date of Birth: 1971-08-14  Transition of Care [facility]) CM/SW Contact:  Bethena Roys, RN Phone Number: 12/05/2022, 2:05 PM   Clinical Narrative:  Case Manager received a notification from the [place] that the patient wants to go home at discharge. Case Manager spoke with patient and the plan will be for home with home health with [facility] services. Referral made to [facility] for RN, PT Services. Start of care to begin within 24-48 hours post transition of care. CSW to assist the patient with transportation to HD. No further needs identified at this time.    Final next level of care: Home w Home Health Services Barriers to Discharge: No Barriers Identified   Patient Goals and CMS Choice CMS Medicare.gov Compare Post Acute Care list provided to:: Patient Choice offered to / list presented to :  (no preference-)   Discharge Plan and Services Additional resources added to the After Visit Summary for   In-house Referral: Clinical Social Work Discharge Planning Services: CM Consult Post Acute Care Choice: Home Health            DME Agency: NA       HH Arranged: RN, Disease Management, PT [place] Agency: [place] Date [place]: 12/05/22 Time [place]: [place] Representative spoke with at [place]: [place] Determinants of Health ([place]) Interventions SDOH Screenings   Food Insecurity: No Food Insecurity (11/26/2022)  Housing: Low Risk  (11/26/2022)  Transportation Needs: No Transportation Needs (11/26/2022)  Utilities: Not At Risk (11/26/2022)  Depression (PHQ2-9): Low Risk  (12/25/2021)  Financial Resource Strain: Low Risk  (02/20/2019)  Physical Activity: Unknown (02/20/2019)  Social Connections: Unknown (02/20/2019)  Stress: No Stress Concern Present (02/20/2019)  Tobacco Use: Low Risk  (12/04/2022)     Readmission Risk  Interventions    12/02/2022    4:02 PM 11/28/2022    1:43 PM 11/04/2022    1:04 PM  Readmission Risk Prevention Plan  Transportation Screening Complete Complete Complete  Medication Review Press photographer) Complete Complete Complete  HRI or [place] Complete Complete Complete  SW Recovery Care/Counseling Consult Complete Complete Complete  Palliative Care Screening Not Applicable Not Applicable Not [place] Not Applicable Not Applicable Not Applicable

## 2022-12-05 NOTE — Progress Notes (Signed)
Pharmacy Antibiotic Note  Kelly Key is a 52 y.o. male admitted on 11/26/2022 with L-foot infection now s/p  second ray amp. ID wants to continue antibiotics for 2 weeks post-op. Pharmacy has been consulted to resume Vancomycin + Cefepime dosing.  Noted IHD on TTS schedule, last HD 2/8 with Vancomycin given but no gram negative coverage.   Plan: - Cefepime 1g IV x 1 dose now - Start Cefepime 2g on TTS at 1800 starting 2/10 - Resume Vancomycin 1g/HD-TTS starting 2/10 - EOT date for 2 weeks post-op 12/16/22 - Will continue to follow HD schedule/duration, culture results, LOT, and antibiotic de-escalation plans   Height: 5' 6"$  (167.6 cm) Weight: (!) 138.6 kg (305 lb 8.9 oz) IBW/kg (Calculated) : 63.8  Temp (24hrs), Avg:97.9 F (36.6 C), Min:97.4 F (36.3 C), Max:98.6 F (37 C)  Recent Labs  Lab 12/01/22 0221 12/02/22 0256 12/03/22 0305 12/04/22 0144 12/05/22 0227  WBC 8.2 8.2 9.7 11.2* 10.0  CREATININE 10.84* 11.94* 10.01* 11.39* 8.39*    Estimated Creatinine Clearance: 13.5 mL/min (A) (by C-G formula based on SCr of 8.39 mg/dL (H)).    Allergies  Allergen Reactions   Ozempic (0.25 Or 0.5 Mg-Dose) [Semaglutide(0.25 Or 0.44m-Dos)] Shortness Of Breath   Hydralazine Anxiety    Hallucinations    Lisinopril Swelling    Thank you for allowing pharmacy to be a part of this patient's care.  EAlycia Rossetti PharmD, BCPS Infectious Diseases Clinical Pharmacist 12/05/2022 8:27 AM   **Pharmacist phone directory can now be found on [email or web address] (PW [email or web address]).  Listed under [place]

## 2022-12-05 NOTE — Discharge Summary (Signed)
Physician Discharge Summary  Kelly Key [id number] DOB: April 01, 1971 DOA: 11/26/2022  PCP: Kelly Gravel, MD  Admit date: 11/26/2022 Discharge date: 12/05/2022  Time spent: *** minutes  Recommendations for Outpatient Follow-up:  ***  ***   Discharge Diagnoses:  Principal Problem:   Gangrene of toe of left foot ([place]) Active Problems:   Osteomyelitis of second toe of left foot ([place])   ESRD (end stage renal disease) (HCC)   PAD (peripheral artery disease) (HCC)   Chronic combined systolic and diastolic CHF (congestive heart failure) ([place])   Diabetes mellitus type 2 in obese (HCC)   GERD (gastroesophageal reflux disease)   Discharge Condition: ***  Diet recommendation: ***  Filed Weights   11/27/22 1845 12/04/22 0825 12/05/22 0447  Weight: (!) 136.1 kg (!) 141.8 kg (!) 138.6 kg    History of present illness:  Kelly Key is [place] 52 y.o. male with PMH chronic dCHF, COPD, ESRD on HD, DMII, hepatic steatosis, HLD, and more who presented with dyspnea.  Patient reports that he has had some level of dyspnea since he was diagnosed with COVID.  He also began developing fevers at home and [place] mild nonproductive cough. He also has had an ongoing wound involving his left foot second digit. Exam was concerning for toe gangrene.  He was admitted for IV antibiotics and evaluation with orthopedic surgery and vascular surgery.  Hospital Course:  Assessment and Plan: * Gangrene of toe of left foot [facility]) - Patient says he has been followed for this by his podiatrist Dr. Irving [place] in [place] is purulent and foul-smelling -Also has old healed chronic wound involving right foot third digit -Continue antibiotics as per ID (vancomycin, ceftazidime, Flagyl); suspect we can discontinue shortly after surgery - Vascular and orthopedic surgery following -Underwent angiogram 2/5 with angioplasty of left anterior tibial artery and angioplasty plus stent of left SFA - Next plan is left 2nd digit  amputation with Kelly [place] on 12/03/22  Osteomyelitis of second toe of left foot ([place]) - Suspected due to underlying PAD - continue abx for now -Plan is for left foot second digit amputation on 12/03/2022 with Kelly [place]  PAD (peripheral artery disease) ([place]) -Underwent angiogram 12/01/22 with angioplasty of left anterior tibial artery and angioplasty plus stent of left SFA - continue asa and statin  ESRD (end stage renal disease) [facility]) - Nephrology arranging hemodialysis - Pt is on schedule with TTS HD treatments  Fever, unspecified-resolved as of 11/29/2022 - Blood cultures negative so far, continue following - Suspected due to setting of toe gangrene  Acute respiratory failure with hypoxia (HCC)-resolved as of 12/01/2022 - Likely related to COVID-19 and possibly some fluid overload - Chest x-ray [place] pulmonary edema and pleural effusions of improved.  Mild residual vascular congestion.  Mild cardiomegaly.  Mild bibasilar atelectasis. - Patient has been COVID-positive since January 9 - no isolation needed per Kelly [place] -Remains on room air  History of COVID-19-resolved as of 11/29/2022 - Initially positive on 11/04/2022 -No indication for treatment or isolation as per Kelly [place]   GERD (gastroesophageal reflux disease) - Continue Protonix  Diabetes mellitus type 2 in obese ([place]) - A1c 7.4% on 10/18/22 - continue Levemir - Continue SSI and CBG monitoring  Chronic combined systolic and diastolic CHF (congestive heart failure) (HCC) - Continue aspirin, statin, Coreg, home dose of Lasix - Not clinically in exacerbation - Volume management with hemodialysis   Hyperkalemia-resolved as of 11/29/2022 - Treated with hemodialysis and resolved now       {  Tip this will not be part of the note when signed Body mass index is 49.32 kg/m. ,  Nutrition Documentation    Flowsheet Row ED to Hosp-Admission (Current) from 11/26/2022 in [facility]  Nutrition Problem Increased  nutrient needs  Etiology wound healing  Nutrition Goal Patient will meet greater than or equal to 90% of their needs  Interventions Nepro shake, MVI, Education     ,  (Optional):26781}   Procedures: ***   Consultations: ***  Discharge Exam: Vitals:   12/05/22 1230 12/05/22 1550  BP: (!) 143/89 (!) 139/91  Pulse: 89 94  Resp: 18 18  Temp: 97.7 F (36.5 C) (!) 97.4 F (36.3 C)  SpO2: 98% 97%    General: *** Cardiovascular: *** Respiratory: ***  Discharge Instructions   Discharge Instructions     (HEART FAILURE PATIENTS) Call MD:  Anytime you have any of the following symptoms: 1) 3 pound weight gain in 24 hours or 5 pounds in 1 week 2) shortness of breath, with or without [place] dry hacking cough 3) swelling in the hands, feet or stomach 4) if you have to sleep on extra pillows at night in order to breathe.   Complete by: As directed    Call MD for:  difficulty breathing, headache or visual disturbances   Complete by: As directed    Call MD for:  extreme fatigue   Complete by: As directed    Call MD for:  hives   Complete by: As directed    Call MD for:  persistant dizziness or light-headedness   Complete by: As directed    Call MD for:  persistant nausea and vomiting   Complete by: As directed    Call MD for:  redness, tenderness, or signs of infection (pain, swelling, redness, odor or green/yellow discharge around incision site)   Complete by: As directed    Call MD for:  severe uncontrolled pain   Complete by: As directed    Call MD for:  temperature >100.4   Complete by: As directed    Diet - low sodium heart healthy   Complete by: As directed    Discharge instructions   Complete by: As directed    You were seen for Kelly [place] diabetic foot infection, gangrenous left toe, and peripheral artery disease.  You had Kelly [place] procedure with vascular which included Kelly [place] left anterior tibial artery angioplasty and angioplasty and stent of left SFA.  You've been started on plavix.   Continue this with aspirin and your statin.  Follow up with vascular surgery in Bobette Leyh month.  You were seen by Kelly [place] and had an amputation of the gangrenous left second toe.  Leave the dressing in place until you follow up with Kelly [place] as an outpatient.  You should NOT drive for at least Rosan Calbert month, you'll need clearance from Kelly [place] before you do.  We're discharging you with Tobi Leinweber plan to continue antibiotics with hemodialysis until 2 weeks post op.    Good blood sugar control is extremely important for wound healing.  Please follow up with your PCP for adjustments to your insulin regimen/diabetes regimen as needed.  Return for new, recurrent, or worsening symptoms.  Please ask your PCP to request records from this hospitalization so they know what was done and what the next steps will be.   Discharge wound care:   Complete by: As directed    Follow up with Kelly [place] for dressing change and wound care instructions  Home infusion instructions   Complete by: As directed    Instructions: Flushing of vascular access device: 0.9% NaCl pre/post medication administration and prn patency; Heparin 100 u/ml, 42m for implanted ports and Heparin 10u/ml, 570mfor all other central venous catheters.   Increase activity slowly   Complete by: As directed    Touch down weight bearing   Complete by: As directed    Laterality: left   Extremity: Lower      Allergies as of 12/05/2022       Reactions   Ozempic (0.25 Or 0.5 Mg-dose) [semaglutide(0.25 Or 0.25m53mos)] Shortness Of Breath   Hydralazine Anxiety   Hallucinations    Lisinopril Swelling        Medication List     STOP taking these medications    furosemide 80 MG tablet Commonly known as: LASIX       TAKE these medications    Accu-Chek Aviva Plus test strip Generic drug: glucose blood Use as instructed TO CHECK BLOOD GLUCOSE THREE TIMES DAILY   acetaminophen 500 MG tablet Commonly known as: TYLENOL Take 1,000 mg by mouth every 6  (six) hours as needed for moderate pain or headache.   albuterol 108 (90 Base) MCG/ACT inhaler Commonly known as: VENTOLIN HFA Inhale 2 puffs into the lungs every 4 (four) hours as needed for wheezing or shortness of breath.   allopurinol 100 MG tablet Commonly known as: ZYLOPRIM Take 2 tablets (200 mg total) by mouth daily.   AMBULATORY NON FORMULARY MEDICATION Inject 0.2 ml by intracavernosal route as directed.  Medication Name: TriMix PGE 10 mcg Pap   30 mg Phent 1 mg   amLODipine 5 MG tablet Commonly known as: NORVASC Take 5 mg by mouth daily.   ammonium lactate 12 % cream Commonly known as: AMLACTIN Apply 1 Application topically in the morning and at bedtime.   ascorbic acid 500 MG tablet Commonly known as: VITAMIN C Take 1 tablet (500 mg total) by mouth daily.   aspirin EC 81 MG tablet Take 1 tablet (81 mg total) by mouth daily.   atorvastatin 80 MG tablet Commonly known as: LIPITOR Take 1 tablet (80 mg total) by mouth daily.   B-D ULTRAFINE III SHORT PEN 31G X 8 MM Misc Generic drug: Insulin Pen Needle 1 each by Does not apply route as directed.   calcium carbonate (dosed in mg elemental calcium) 1250 MG/5ML Susp Take 5 mLs (500 mg of elemental calcium total) by mouth every 6 (six) hours as needed for indigestion.   carvedilol 25 MG tablet Commonly known as: COREG Take 1 tablet (25 mg total) by mouth 2 (two) times daily with Miriana Gaertner meal.   ceFEPime  IVPB Commonly known as: MAXIPIME Inject 2 g into the vein Every Tuesday,Thursday,and Saturday with dialysis for 10 days. Indication:  L-foot infection First Dose: Yes Last Day of Therapy:  12/16/22 Labs - Once weekly:  CBC/D and BMP, Labs - Every other week:  ESR and CRP Method of administration: Per hemodialysis protocol at HD center Method of administration may be changed at the discretion of home infusion pharmacist based upon assessment of the patient and/or caregiver's ability to self-administer the medication  ordered. Start taking on: December 06, 2022   cloNIDine 0.3 MG tablet Commonly known as: CATAPRES Take 0.3 mg by mouth 3 (three) times daily.   clopidogrel 75 MG tablet Commonly known as: PLAVIX Take 1 tablet (75 mg total) by mouth daily. Start taking on: December 06, 2022   esomeprazole  20 MG capsule Commonly known as: NEXIUM Take 20 mg by mouth daily.   ezetimibe 10 MG tablet Commonly known as: ZETIA Take 10 mg by mouth every evening.   fluticasone 50 MCG/ACT nasal spray Commonly known as: FLONASE Place 1 spray into both nostrils daily as needed for allergies or rhinitis.   gabapentin 100 MG capsule Commonly known as: NEURONTIN Take 1 capsule (100 mg total) by mouth 3 (three) times daily.   guaiFENesin-dextromethorphan 100-10 MG/5ML syrup Commonly known as: ROBITUSSIN DM Take 10 mLs by mouth every 8 (eight) hours.   HumaLOG KwikPen 100 UNIT/ML KwikPen Generic drug: insulin lispro Inject 8-14 Units into the skin 3 (three) times daily.   hydrOXYzine 25 MG tablet Commonly known as: ATARAX Take 25 mg by mouth 3 (three) times daily as needed for anxiety.   Ipratropium-Albuterol 20-100 MCG/ACT Aers respimat Commonly known as: COMBIVENT Inhale 1 puff into the lungs every 6 (six) hours as needed for wheezing or shortness of breath.   Levemir FlexTouch 100 UNIT/ML FlexPen Generic drug: insulin detemir INJECT 50 UNITS SUBCUTANEOUSLY AT BEDTIME What changed: See the new instructions.   linaclotide 145 MCG Caps capsule Commonly known as: LINZESS Take 1 capsule (145 mcg total) by mouth daily before breakfast.   multivitamin Tabs tablet Take 1 tablet by mouth at bedtime.   mupirocin ointment 2 % Commonly known as: BACTROBAN Apply 1 Application topically 2 (two) times daily.   oxyCODONE 5 MG immediate release tablet Commonly known as: Oxy IR/ROXICODONE Take 1-2 tablets (5-10 mg total) by mouth every 8 (eight) hours as needed for up to 3 days for severe pain.    Ozempic (1 MG/DOSE) 4 MG/3ML Sopn Generic drug: Semaglutide (1 MG/DOSE) Inject 1 mg into the skin once Imanie Darrow week. Fridays.   sevelamer carbonate 800 MG tablet Commonly known as: RENVELA Take 3 tablets (2,400 mg total) by mouth 3 (three) times daily with meals.   vancomycin  IVPB Inject 1,000 mg into the vein Every Tuesday,Thursday,and Saturday with dialysis for 10 days. Indication:  L-foot infection First Dose: Yes Last Day of Therapy:  12/16/22 Labs - Sunday/Monday:  CBC/D, BMP, and vancomycin trough. Labs - Thursday:  BMP and vancomycin trough Labs - Every other week:  ESR and CRP Method of administration: Per HD protocol - to be [place] with outpatient HD Method of administration may be changed at the discretion of the patient and/or caregiver's ability to self-administer the medication ordered. Start taking on: December 06, 2022   vardenafil 20 MG tablet Commonly known as: LEVITRA Take 1 tablet (20 mg total) by mouth daily as needed for erectile dysfunction.   zinc sulfate 220 (50 Zn) MG capsule Take 1 capsule (220 mg total) by mouth daily.               Home Infusion Instuctions  (From admission, onward)           Start     Ordered   12/05/22 0000  Home infusion instructions       Question:  Instructions  Answer:  Flushing of vascular access device: 0.9% NaCl pre/post medication administration and prn patency; Heparin 100 u/ml, 15m for implanted ports and Heparin 10u/ml, 544mfor all other central venous catheters.   12/05/22 0846              Discharge Care Instructions  (From admission, onward)           Start     Ordered   12/05/22 0000  Touch  down weight bearing       Question Answer Comment  Laterality left   Extremity Lower      12/05/22 1636   12/05/22 0000  Discharge wound care:       Comments: Follow up with [place] for dressing change and wound care instructions   12/05/22 1636           Allergies  Allergen Reactions   Ozempic  (0.25 Or 0.5 Mg-Dose) [Semaglutide(0.25 Or 0.53m-Dos)] Shortness Of Breath   Hydralazine Anxiety    Hallucinations    Lisinopril Swelling    Follow-up Information     CMarty Heck MD Follow up in 4 week(s).   Specialty: Vascular Surgery Why: Office will call you to arrange your appt (sent) Contact information: [phone]         DNewt Minion MD Follow up in 1 week(s).   Specialty: Orthopedic Surgery Contact information: [address] [id number].   Specialty: Home Health Services Why: Reigsitered Nurse and Physical Therapy-office to call with visit times. Contact information: [address] [place] [phone]                 The results of significant diagnostics from this hospitalization (including imaging, microbiology, ancillary and laboratory) are listed below for reference.    Significant Diagnostic Studies: PERIPHERAL VASCULAR CATHETERIZATION  Result Date: 12/01/2022 Images from the original result were not included.   Patient name: DADRIANO KABA  MRN: [id number]       DOB: [birth year]           Sex: male  12/01/2022 Pre-operative Diagnosis: Critical limb ischemia of the left lower extremity with tissue loss Post-operative diagnosis:  Same Surgeon:  CMarty Heck MD Procedure Performed: 1.  Ultrasound-guided access right common femoral artery 2.  Aortogram with catheter selection of aorta 3.  Left lower extremity arteriogram with selection of third order branches 4.  Left anterior tibial artery angioplasty (3 mm x 220 mm Sterling) 5.  Angioplasty and stent of left SFA (7 mm x 120 mm drug-coated Eluvia postdilated with Ramsey Midgett 6 mm Mustang) 6.  Mynx closure of the right common femoral artery 7.  67 minutes of monitored moderate conscious sedation time  Indications: 52year old male seen in consultation over the weekend by my  partner Dr. RVirl Cageywith critical limb ischemia and tissue loss in the left lower extremity.  He has multiple risk factors including end-stage renal disease and diabetes.  He presents today for aortogram, lower extremity arteriogram, with possible invention with Savion Washam focus on the left leg after risks and benefits discussed.  Findings:  Aortogram showed no significant aortoiliac stenosis.  The renal arteries were poorly visualized.  Left lower extremity arteriogram showed Jevin Camino patent common femoral and profunda.  The SFA was patent but had two focal areas of stenosis approximately 60% each with diffuse disease adjacent to these segments.  The popliteal artery was patent.  The runoff below the knee was in the posterior tibial and peroneal that were widely patent.  The anterior tibial was diffusely diseased with Gilmore List high-grade stenosis throughout the entire artery from the takeoff of the anterior tibial to just above the ankle.  Ultimately we were able to get down the anterior tibial antegrade from contralateral groin access and this was angioplastied with Tijah Hane 3 mm  x 220 mm Sterling with excellent results and he now has three-vessel runoff below the knee.  The left SFA was stented with Carliss Quast 7 mm x 120 mm Eluvia postdilated with Ellieanna Funderburg 6 mm Mustang with no residual stenosis.  Has inline flow with no significant stenosis after intervention.             Procedure:  The patient was identified in the holding area and taken to room 8.  The patient was then placed supine on the table and prepped and draped in the usual sterile fashion.  Stela Iwasaki time out was called.  Patient received Versed and fentanyl for conscious moderate sedation.  Vital signs were monitored including heart rate, respiratory rate, oxygenation and blood pressure.  I was present for all of moderate sedation.  Ultrasound was used to evaluate the right common femoral artery.  It was patent .  Satoya Feeley digital ultrasound image was acquired.  Jeromie Gainor micropuncture needle was used to access the  right common femoral artery under ultrasound guidance.  An 018 wire was advanced without resistance and Tywaun Hiltner micropuncture sheath was placed.  The 018 wire was removed and Randy Whitener benson wire was placed.  The micropuncture sheath was exchanged for Vallory Oetken 5 french sheath.  An omniflush catheter was advanced over the wire to the level of L-1.  An abdominal angiogram was obtained.  Next, using the omniflush catheter and Abigaelle Verley benson wire, the aortic bifurcation was crossed and the catheter was placed into theleft external iliac artery and left runoff was obtained.  After evaluating images elected for left leg intervention.  Kamayah Pillay Glidewire advantage was placed in the left SFA and we placed Natanya Holecek long 7 French sheath in the right groin over the aortic bifurcation.  Patient was [place] 100 units/kg IV heparin.  I then exchanged for Hoby Kawai V18 wire and was able to cannulate the anterior tibial antegrade from contralateral groin access and get all the way down the vessel and crossed the diseased segment into the dorsalis pedis of the foot.  I then confirmed with hand-injection I was in the true lumen distally.  The entire anterior tibial was then angioplastied with Jacob Cicero 3 mm x 220 mm Sterling to nominal pressure for 2 minutes throughout the segment.  He now has Terrilynn Postell widely patent anterior tibial.  I then went up to the SFA and got Exodus Kutzer planning shot and elected to treat the SFA disease with Iyan Flett 7 mm x 120 mm Eluvia postdilated with Lailana Shira 6 mm Mustang.  Widely patent SFA stent at completion.  Wires and catheters were removed.  Mikeria Valin short 7 French sheath was placed in the right groin.  Tashawnda Bleiler mynx closure device was deployed after an access shot was obtained.  Plan: Patient is now optimized after left SFA stent and left anterior tibial angioplasty.  Has inline flow down the left lower extremity with no significant residual stenosis and three-vessel runoff.  Will load on Plavix.  On aspirin statin as well.    Marty Heck, MD [facility]  Office: [phone]   MR FOOT LEFT WO CONTRAST  Result Date: 11/28/2022 CLINICAL DATA:  Foot swelling, diabetic, osteomyelitis suspected. Concern for wet gangrene of the left 2nd toe. EXAM: MRI OF THE LEFT FOOT WITHOUT CONTRAST TECHNIQUE: Multiplanar, multisequence MR imaging of the left forefoot was performed. No intravenous contrast was administered. COMPARISON:  Previous MRI 11/06/2021. No comparison radiographs available. FINDINGS: Despite efforts by the technologist and patient, mild-to-moderate motion artifact is present on today's  exam and could not be eliminated. This reduces exam sensitivity and specificity. Motion is greatest on the sagittal images which are essentially nondiagnostic. Bones/Joint/Cartilage Evaluation of the distal phalanges is limited by clawtoe deformities and the lack of diagnostic sagittal images. The distal 2nd phalanx appears abnormal with diffusely decreased T1 and increased T2 signal. In addition, there is probable cortical destruction. The proximal and middle phalanges demonstrate nonspecific low-level marrow T2 hyperintensity without T1 signal abnormality. Presumed interval resection of the distal 3rd and 4th phalanges. No acute findings are seen within the great or small toes. There are degenerative changes at the 1st interphalangeal and metatarsophalangeal joints. No significant joint effusions. Ligaments The collateral ligaments at the metatarsophalangeal joints are intact. The Lisfranc ligament is intact. Muscles and Tendons Chronic generalized fatty atrophy throughout the forefoot musculature, similar to previous study. Mild tenosynovitis of the flexor tendon of the 2nd toe. The extensor tendons appear unremarkable. Soft tissues Nonspecific dorsal subcutaneous edema throughout the forefoot. There is generalized soft tissue swelling in the 2nd toe which appears greatest distally. Cannot exclude Marinus Eicher small amount of soft tissue emphysema within the distal aspect of the 2nd  toe. No focal fluid collection identified. IMPRESSION: 1. Examination is limited by motion, especially on the sagittal images. Plain film correlation and correlation with prior surgical history recommended. 2. The distal 2nd phalanx appears abnormal with probable cortical destruction and marrow edema, suspicious for osteomyelitis or osteonecrosis. 3. The proximal and middle phalanges of the 2nd toe demonstrate nonspecific low-level marrow T2 hyperintensity without T1 signal abnormality which could be reactive. 4. Diffuse soft tissue swelling throughout the 2nd toe consistent with soft tissue infection. Cannot exclude Elexius Minar small amount of soft tissue emphysema distally which could indicate necrotizing infection (gangrene). No focal fluid collection identified. Mild flexor tenosynovitis of the 2nd toe. 5. Presumed interval resection of the distal 3rd and 4th phalanges. Electronically Signed   By: Richardean Sale M.D.   On: 11/28/2022 13:02   US ARTERIAL ABI (SCREENING LOWER EXTREMITY)  Result Date: 11/28/2022 CLINICAL DATA:  Toe gangrene EXAM: NONINVASIVE PHYSIOLOGIC VASCULAR STUDY OF BILATERAL LOWER EXTREMITIES TECHNIQUE: Evaluation of both lower extremities were performed at rest, including calculation of ankle-brachial indices with single level pressure measurements and doppler recording. COMPARISON:  None available. FINDINGS: Right ABI:  1.12 Left ABI:  0.89 Right Lower Extremity: Multiphasic waveform in posterior tibial artery. Dorsalis pedis is monophasic. Left Lower Extremity: Posterior tibial and dorsalis pedis are monophasic. 0.8-0.89 Mild PAD IMPRESSION: Mild left lower extremity PAD. Electronically Signed   By: Miachel Roux M.D.   On: 11/28/2022 10:08   DG CHEST PORT 1 VIEW  Result Date: 11/28/2022 CLINICAL DATA:  Dyspnea and fever, COVID infection EXAM: PORTABLE CHEST 1 VIEW COMPARISON:  11/26/2022 FINDINGS: Upper normal heart size for technique. Previous cephalization of blood flow in indistinct  pulmonary vasculature has improved, currently with no compelling findings of pulmonary venous hypertension or edema. Atherosclerotic calcification of the aortic arch. No blunting of the costophrenic angles. Mild thoracic spondylosis. IMPRESSION: 1. Upper normal heart size for technique. No compelling findings of pulmonary venous hypertension or edema. 2. Mild thoracic spondylosis. Electronically Signed   By: Van Clines M.D.   On: 11/28/2022 07:36   DG Chest Port 1 View  Result Date: 11/26/2022 CLINICAL DATA:  Shortness of breath. EXAM: PORTABLE CHEST 1 VIEW COMPARISON:  11/04/2022 FINDINGS: Improved pulmonary edema from prior exam. There is mild vascular congestion. Mild cardiomegaly appears similar. Decreased bilateral pleural effusions. Mild bibasilar atelectasis without  confluent airspace disease. No pneumothorax. IMPRESSION: 1. Improved pulmonary edema and pleural effusions from prior exam. Mild residual vascular congestion. Mild cardiomegaly is similar. 2. Mild bibasilar atelectasis. Electronically Signed   By: Keith Rake M.D.   On: 11/26/2022 18:31    Microbiology: Recent Results (from the past 240 hour(s))  Resp panel by RT-PCR (RSV, Flu Jayme Cham&B, Covid) Anterior Nasal Swab     Status: Abnormal   Collection Time: 11/26/22  7:58 PM   Specimen: Anterior Nasal Swab  Result Value Ref Range Status   SARS Coronavirus 2 by RT PCR POSITIVE (Ricka Westra) NEGATIVE Final    Comment: (NOTE) SARS-CoV-2 target nucleic acids are DETECTED.  The SARS-CoV-2 RNA is generally detectable in upper respiratory specimens during the acute phase of infection. Positive results are indicative of the presence of the identified virus, but do not rule out bacterial infection or co-infection with other pathogens not detected by the test. Clinical correlation with patient history and other diagnostic information is necessary to determine patient infection status. The expected result is Negative.  Fact Sheet for  Patients: EntrepreneurPulse.com.au  Fact Sheet for Healthcare Providers: IncredibleEmployment.be  This test is not yet approved or cleared by the [place] FDA and  has been authorized for detection and/or diagnosis of SARS-CoV-2 by FDA under an Emergency Use Authorization (EUA).  This EUA will remain in effect (meaning this test can be used) for the duration of  the COVID-19 declaration under Section 564(b)(1) of the [place] ct, 21 U.S.C. section 360bbb-3(b)(1), unless the authorization is terminated or revoked sooner.     Influenza Ikesha Siller by PCR NEGATIVE NEGATIVE Final   Influenza B by PCR NEGATIVE NEGATIVE Final    Comment: (NOTE) The Xpert Xpress SARS-CoV-2/FLU/RSV plus assay is intended as an aid in the diagnosis of influenza from Nasopharyngeal swab specimens and should not be used as Zanayah Shadowens sole basis for treatment. Nasal washings and aspirates are unacceptable for Xpert Xpress SARS-CoV-2/FLU/RSV testing.  Fact Sheet for Patients: EntrepreneurPulse.com.au  Fact Sheet for Healthcare Providers: IncredibleEmployment.be  This test is not yet approved or cleared by the [place] FDA and has been authorized for detection and/or diagnosis of SARS-CoV-2 by FDA under an Emergency Use Authorization (EUA). This EUA will remain in effect (meaning this test can be used) for the duration of the COVID-19 declaration under Section 564(b)(1) of the Act, 21 U.S.C. section 360bbb-3(b)(1), unless the authorization is terminated or revoked.     Resp Syncytial Virus by PCR NEGATIVE NEGATIVE Final    Comment: (NOTE) Fact Sheet for Patients: EntrepreneurPulse.com.au  Fact Sheet for Healthcare Providers: IncredibleEmployment.be  This test is not yet approved or cleared by the [place] FDA and has been authorized for detection and/or diagnosis of SARS-CoV-2 by FDA under an Emergency Use  Authorization (EUA). This EUA will remain in effect (meaning this test can be used) for the duration of the COVID-19 declaration under Section 564(b)(1) of the Act, 21 U.S.C. section 360bbb-3(b)(1), unless the authorization is terminated or revoked.  Performed at [facility], [address], [place], [place] [address]   Culture, blood (Routine X 2) w Reflex to ID Panel     Status: None   Collection Time: 11/27/22  1:55 PM   Specimen: BLOOD  Result Value Ref Range Status   Specimen Description BLOOD aline  Final   Special Requests   Final    BOTTLES DRAWN AEROBIC AND ANAEROBIC Blood Culture adequate volume   Culture   Final    NO GROWTH 5  DAYS Performed at [facility], [address] [place], [place] [address]    Report Status 12/02/2022 FINAL  Final  Culture, blood (Routine X 2) w Reflex to ID Panel     Status: None   Collection Time: 11/27/22  1:58 PM   Specimen: BLOOD  Result Value Ref Range Status   Specimen Description BLOOD vline  Final   Special Requests   Final    BOTTLES DRAWN AEROBIC AND ANAEROBIC Blood Culture adequate volume   Culture   Final    NO GROWTH 5 DAYS Performed at [facility], [address] [place], [place] [address]    Report Status 12/02/2022 FINAL  Final  Surgical pcr screen     Status: Abnormal   Collection Time: 12/02/22 11:21 PM   Specimen: Nasal Mucosa; Nasal Swab  Result Value Ref Range Status   MRSA, PCR POSITIVE (Connie Lasater) NEGATIVE Final    Comment: RESULT CALLED TO, READ BACK BY AND VERIFIED WITH: Clista Rainford DELANEY,RN@0048$  12/03/22 [place]    Staphylococcus aureus POSITIVE (Rinda Rollyson) NEGATIVE Final    Comment: (NOTE) The Xpert SA Assay (FDA approved for NASAL specimens in patients 100 years of age and older), is one component of Brantleigh Mifflin comprehensive surveillance program. It is not intended to diagnose infection nor to guide or monitor treatment. Performed at [facility], [place] [address] [place], [place] [address]      Labs: Basic Metabolic Panel: Recent  Labs  Lab 11/30/22 0232 12/01/22 0221 12/02/22 0256 12/03/22 0305 12/04/22 0144 12/05/22 0227  NA 133* 132* 130* 131* 126* 130*  K 3.8 4.1 4.4 4.6 4.8 4.4  CL 92* 91* 90* 89* 86* 91*  CO2 25 26 23 25 22 24  $ GLUCOSE 141* 164* 145* 182* 313* 139*  BUN 59* 94* 110* 87* 114* 74*  CREATININE 9.13* 10.84* 11.94* 10.01* 11.39* 8.39*  CALCIUM 8.4* 8.7* 8.5* 8.7* 8.7* 8.6*  MG 1.9 2.2 2.3 2.1 2.3  --   PHOS 4.8* 5.5* 6.2* 4.7* 3.7 5.4*   Liver Function Tests: Recent Labs  Lab 12/01/22 0221 12/02/22 0256 12/03/22 0305 12/04/22 0144 12/05/22 0227  ALBUMIN 2.8* 2.7* 2.7* 2.7* 2.7*   No results for input(s): "LIPASE", "AMYLASE" in the last 168 hours. No results for input(s): "AMMONIA" in the last 168 hours. CBC: Recent Labs  Lab 11/30/22 0232 12/01/22 0221 12/02/22 0256 12/03/22 0305 12/04/22 0144 12/05/22 0227  WBC 6.9 8.2 8.2 9.7 11.2* 10.0  NEUTROABS 3.3 4.4 4.7 6.0 8.7*  --   HGB 8.8* 9.7* 9.6* 9.8* 9.0* 9.2*  HCT 27.6* 29.5* 29.5* 29.9* 26.7* 28.4*  MCV 88.7 86.5 86.8 86.9 84.8 88.2  PLT 269 323 364 367 417* 429*   Cardiac Enzymes: No results for input(s): "CKTOTAL", "CKMB", "CKMBINDEX", "TROPONINI" in the last 168 hours. BNP: BNP (last 3 results) Recent Labs    07/28/22 0234 11/04/22 0859 11/26/22 1851  BNP 192.0* 596.0* 510.0*    ProBNP (last 3 results) No results for input(s): "PROBNP" in the last 8760 hours.  CBG: Recent Labs  Lab 12/04/22 1835 12/04/22 2135 12/05/22 0817 12/05/22 1227 12/05/22 1549  GLUCAP 274* 264* 115* 173* 230*       Signed:  Fayrene Helper MD.  [facility] 12/05/2022, 4:36 PM

## 2022-12-06 LAB — RENAL FUNCTION PANEL
Albumin: 2.6 g/dL — ABNORMAL LOW (ref 3.5–5.0)
Anion gap: 16 — ABNORMAL HIGH (ref 5–15)
BUN: 95 mg/dL — ABNORMAL HIGH (ref 6–20)
CO2: 24 mmol/L (ref 22–32)
Calcium: 8.7 mg/dL — ABNORMAL LOW (ref 8.9–10.3)
Chloride: 88 mmol/L — ABNORMAL LOW (ref 98–111)
Creatinine, Ser: 10.29 mg/dL — ABNORMAL HIGH (ref 0.61–1.24)
GFR, Estimated: 5 mL/min — ABNORMAL LOW (ref >60)
Glucose, Bld: 206 mg/dL — ABNORMAL HIGH (ref 70–99)
Phosphorus: 6.1 mg/dL — ABNORMAL HIGH (ref 2.5–4.6)
Potassium: 4.6 mmol/L (ref 3.5–5.1)
Sodium: 128 mmol/L — ABNORMAL LOW (ref 135–145)

## 2022-12-06 LAB — GLUCOSE, CAPILLARY: Glucose-Capillary: 165 mg/dL — ABNORMAL HIGH (ref 70–99)

## 2022-12-06 MED ORDER — SODIUM CHLORIDE 0.9 % IV SOLN
2.0000 g | Freq: Once | INTRAVENOUS | Status: AC
  Administered 2022-12-06: 2 g via INTRAVENOUS
  Filled 2022-12-06: qty 12.5

## 2022-12-06 NOTE — Progress Notes (Signed)
[facility] Progress Note   Subjective:   Seen on HD, tolerating well, no concerns. Denies SOB, CP and dizziness. BP variable, overall still high.   Objective Vitals:   12/06/22 0556 12/06/22 0706 12/06/22 0734 12/06/22 0750  BP: (!) 170/96 (!) 171/99 (!) 175/99 (!) 170/102  Pulse: 100 98 96 93  Resp: 18 18 14 18  $ Temp: 97.8 F (36.6 C) 98.1 F (36.7 C) (!) 97 F (36.1 C)   TempSrc: Oral Oral Oral   SpO2: 97% 100% 98% 97%  Weight:      Height:       Physical Exam General: Alert male in NAD Heart: RRR, no murmurs, rubs or gallops Lungs: CTA bilaterally, no wheezing, rhonchi or rales Abdomen: Soft, non-distended, +BS Extremities: L foot wrapped, no edema Dialysis Access:  LUE AVF accessed  Additional Objective Labs: Basic Metabolic Panel: Recent Labs  Lab 12/04/22 0144 12/05/22 0227 12/06/22 0158  NA 126* 130* 128*  K 4.8 4.4 4.6  CL 86* 91* 88*  CO2 22 24 24  $ GLUCOSE 313* 139* 206*  BUN 114* 74* 95*  CREATININE 11.39* 8.39* 10.29*  CALCIUM 8.7* 8.6* 8.7*  PHOS 3.7 5.4* 6.1*   Liver Function Tests: Recent Labs  Lab 12/04/22 0144 12/05/22 0227 12/06/22 0158  ALBUMIN 2.7* 2.7* 2.6*   No results for input(s): "LIPASE", "AMYLASE" in the last 168 hours. CBC: Recent Labs  Lab 12/01/22 0221 12/02/22 0256 12/03/22 0305 12/04/22 0144 12/05/22 0227  WBC 8.2 8.2 9.7 11.2* 10.0  NEUTROABS 4.4 4.7 6.0 8.7*  --   HGB 9.7* 9.6* 9.8* 9.0* 9.2*  HCT 29.5* 29.5* 29.9* 26.7* 28.4*  MCV 86.5 86.8 86.9 84.8 88.2  PLT 323 364 367 417* 429*   Blood Culture    Component Value Date/Time   SDES BLOOD vline 11/27/2022 1358   SPECREQUEST  11/27/2022 1358    BOTTLES DRAWN AEROBIC AND ANAEROBIC Blood Culture adequate volume   CULT  11/27/2022 1358    NO GROWTH 5 DAYS Performed at [facility], [address] [place], [place] [address]    REPTSTATUS 12/02/2022 FINAL 11/27/2022 1358    Cardiac Enzymes: No results for input(s): "CKTOTAL", "CKMB",  "CKMBINDEX", "TROPONINI" in the last 168 hours. CBG: Recent Labs  Lab 12/04/22 2135 12/05/22 0817 12/05/22 1227 12/05/22 1549 12/05/22 2115  GLUCAP 264* 115* 173* 230* 190*   Iron Studies: No results for input(s): "IRON", "TIBC", "TRANSFERRIN", "FERRITIN" in the last 72 hours. @lablastinr3$ @ Studies/Results: No results found. Medications:  sodium chloride     sodium chloride 75 mL/hr at 12/03/22 1751   ceFEPime (MAXIPIME) IV     magnesium sulfate bolus IVPB     vancomycin      vitamin C  500 mg Oral BID   aspirin EC  81 mg Oral Daily   atorvastatin  80 mg Oral Daily   calcitRIOL  2.5 mcg Oral Q T,Th,Sa-HD   carvedilol  12.5 mg Oral BID WC   Chlorhexidine Gluconate Cloth  6 each Topical Q0600   Chlorhexidine Gluconate Cloth  6 each Topical Q0600   cloNIDine  0.1 mg Oral BID   clopidogrel  75 mg Oral Daily   docusate sodium  100 mg Oral Daily   ezetimibe  10 mg Oral QPM   feeding supplement (NEPRO CARB STEADY)  237 mL Oral TID BM   gabapentin  100 mg Oral TID   guaiFENesin-dextromethorphan  10 mL Oral Q8H   heparin  5,000 Units Subcutaneous Q8H   insulin  aspart  0-15 Units Subcutaneous TID WC   insulin aspart  0-5 Units Subcutaneous QHS   insulin detemir  40 Units Subcutaneous QHS   multivitamin  1 tablet Oral QHS   pantoprazole  40 mg Oral Daily   sevelamer carbonate  2,400 mg Oral TID WC   sodium chloride flush  3 mL Intravenous Q12H   zinc sulfate  220 mg Oral Daily    Dialysis Orders: [facility] TTS 4:15hr, 450/500, EDW 138.5kg, 3K/2.5Ca, heparin 2600u loading + 1000u/hr - Mircera 30 mcg (last given on 11/25/22) - Venofer 50 mg weekly (last given on 11/25/22) - Calcitriol 2.5 mcg three days a week    Assessment/Plan: ESRD: Continue HD on usual TTS schedule.  Left second toe gangrene: S/p angioplasty to L ATA and angioplasty/stent to L SFA on 2/5, then 2nd ray amputation 2/7. Ortho and VVS following. Remains on IV Vanc/Ceftaz + PO metronidazole. Acute hypoxic  respiratory failure: Mild overload + recent Hx COVID: Challenge EDW as tolerated. Tolerating 3L UF goals Hx COVID pneumonia: Off isolation at this time. Hypertension/volume: BP high. Prev on clonidine 0.63m TID -> now 0.181mTID, prev on Coreg 2537mID -> now 12.5mg36mD and off amlodipine. Does not appear volume overloaded but sodium 129- attempting higher UF goal today.   Anemia of ESRD: Hgb 9.2, Mirera last given 11/25/21 - not due yet.  Secondary hyperparathyroidism: Ca/Phos ok - continue home VDRA + binders (sevelamer) Nutrition: Alb low, continue protein supplements T2DM: Per primary.  [facility] 12/06/2022, 8:25 AM  [facility] Pager: [phone]

## 2022-12-06 NOTE — Progress Notes (Signed)
   12/06/22 1200  Pain Assessment  Pain Scale 0-10  Pain Score 0  Fistula / Graft Left Upper arm Arteriovenous fistula  Placement Date/Time: 11/11/19 0843   Placed prior to admission: No  Orientation: Left  Access Location: Upper arm  Access Type: Arteriovenous fistula  Site Condition No complications  Fistula / Graft Assessment Present;Thrill;Bruit  Status Deaccessed  Needle Size 15 guage needles--no ecchymosis no infiltrations  Drainage Description None  Neurological  Level of Consciousness Alert  Orientation Level Oriented X4  Respiratory  Respiratory Pattern Regular;Unlabored  Chest Assessment Chest expansion symmetrical  Bilateral Breath Sounds Clear  R Upper  Breath Sounds Clear  L Upper Breath Sounds Clear  R Lower Breath Sounds Diminished  L Lower Breath Sounds Diminished  Cough None  Sputum Amount None  Sputum Color Clear   Received patient in bed to unit.  Alert and oriented.  Informed consent signed and in chart.   TX duration:4 hours  Patient tolerated well.  Transported back to the room  Alert, without acute distress.  Hand-off given to patient's nurse.   Access used: AVF Access issues: no complications  Total UF removed: 2300 Medication(s) given: none     [facility]

## 2022-12-06 NOTE — Progress Notes (Addendum)
Occupational Therapy Treatment Patient Details Name: PHENIX SUNSHINE MRN: [id number] DOB: 1971/10/06 Today's Date: 12/06/2022   History of present illness Pt is a 52 y.o. male who presented 11/26/22 with dyspnea. Admitted with acute respiratory failure with hypoxia and found to have gangrene and osteomyelitis of 2nd toe of L foot. S/p left SFA stenting with AT angioplasty 2/5, left 2nd toe amputation 2/7. PMH: chronic diastolic CHF, COPD, ESRD, DM2, hepatic steatosis, HLD, gout, HTN   OT comments  Pt progressing towards goals, needing min A to don L darco shoe prior to getting OOB, min guard overall for simulated toilet transfer and ambulation in room. Pt needing supervision for bed mobility. Pt presenting with impairments listed below, will follow acutely. Continue to recommend HHOT at d/c.   Recommendations for follow up therapy are one component of a multi-disciplinary discharge planning process, led by the attending physician.  Recommendations may be updated based on patient status, additional functional criteria and insurance authorization.    Follow Up Recommendations  Home health OT     Assistance Recommended at Discharge Intermittent Supervision/Assistance  Patient can return home with the following  A little help with walking and/or transfers;A lot of help with bathing/dressing/bathroom;Assistance with cooking/housework;Help with stairs or ramp for entrance;Assist for transportation   Equipment Recommendations  None recommended by OT    Recommendations for Other Services      Precautions / Restrictions Restrictions Weight Bearing Restrictions: Yes LLE Weight Bearing: Partial weight bearing LLE Partial Weight Bearing Percentage or Pounds: heel weight bearing Other Position/Activity Restrictions: with post-op shoe       Mobility Bed Mobility Overal bed mobility: Needs Assistance Bed Mobility: Supine to Sit Rolling: Supervision   Supine to sit: Supervision Sit to supine:  Supervision        Transfers Overall transfer level: Needs assistance Equipment used: Rolling walker (2 wheels) Transfers: Sit to/from Stand Sit to Stand: Min guard          Lateral/Scoot Transfers: Min guard       Balance Overall balance assessment: Needs assistance Sitting-balance support: No upper extremity supported, Feet supported Sitting balance-Leahy Scale: Good     Standing balance support: Bilateral upper extremity supported Standing balance-Leahy Scale: Poor Standing balance comment: reliant on RW support                           ADL either performed or assessed with clinical judgement   ADL Overall ADL's : Needs assistance/impaired                     Lower Body Dressing: Minimal assistance Lower Body Dressing Details (indicate cue type and reason): donning of darco shoe Toilet Transfer: Min guard;Ambulation;Regular Toilet;Rolling walker (2 wheels)           Functional mobility during ADLs: Min guard;Rolling walker (2 wheels);Cueing for sequencing      Extremity/Trunk Assessment Upper Extremity Assessment Upper Extremity Assessment: Overall WFL for tasks assessed   Lower Extremity Assessment Lower Extremity Assessment: Defer to PT evaluation        Vision   Vision Assessment?: No apparent visual deficits   Perception Perception Perception: Not tested   Praxis Praxis Praxis: Not tested    Cognition Arousal/Alertness: Awake/alert Behavior During Therapy: WFL for tasks assessed/performed Overall Cognitive Status: Within Functional Limits for tasks assessed  Exercises      Shoulder Instructions       General Comments VSS on RA    Pertinent Vitals/ Pain       Pain Assessment Pain Assessment: Faces Pain Score: 2  Faces Pain Scale: Hurts a little bit Pain Location: L foot Pain Descriptors / Indicators: Discomfort Pain Intervention(s): Limited activity  within patient's tolerance, Monitored during session, Repositioned  Home Living                                          Prior Functioning/Environment              Frequency  Min 2X/week        Progress Toward Goals  OT Goals(current goals can now be found in the care plan section)  Progress towards OT goals: Progressing toward goals  Acute Rehab OT Goals Patient Stated Goal: to go home OT Goal Formulation: With patient Time For Goal Achievement: 12/19/22 Potential to Achieve Goals: Good ADL Goals Pt Will Perform Grooming: with modified independence;sitting Pt Will Perform Lower Body Dressing: with supervision;sit to/from stand;with adaptive equipment;sitting/lateral leans Pt Will Transfer to Toilet: with modified independence;ambulating Pt Will Perform Toileting - Clothing Manipulation and hygiene: with modified independence;sit to/from stand  Plan Discharge plan remains appropriate;Frequency remains appropriate    Co-evaluation                 AM-PAC OT "6 Clicks" Daily Activity     Outcome Measure   Help from another person eating meals?: None Help from another person taking care of personal grooming?: A Little Help from another person toileting, which includes using toliet, bedpan, or urinal?: A Little Help from another person bathing (including washing, rinsing, drying)?: A Lot Help from another person to put on and taking off regular upper body clothing?: A Little Help from another person to put on and taking off regular lower body clothing?: A Lot 6 Click Score: 17    End of Session Equipment Utilized During Treatment: Gait belt;Rolling walker (2 wheels)  OT Visit Diagnosis: Other abnormalities of gait and mobility (R26.89);Muscle weakness (generalized) (M62.81)   Activity Tolerance Patient tolerated treatment well   Patient Left in bed;with call bell/phone within reach;with bed alarm set;with family/visitor present   Nurse  Communication Mobility status        Time: [id number] OT Time Calculation (min): 21 min  Charges: OT General Charges $OT Visit: 1 Visit OT Treatments $Self Care/Home Management : 8-22 mins  Renaye Rakers, OTD, OTR/L [employer] Acute Rehab [phone] - [place] 12/06/2022, 3:02 PM

## 2022-12-08 NOTE — Progress Notes (Signed)
Late Note Entry  Pt d/c on Saturday. Contacted [employer] and spoke to [place] this morning. Clinic advised of pt's d/c. D/C summary and last renal note faxed to clinic for continuation of care.   Melven Sartorius Renal Navigator [phone]

## 2022-12-09 ENCOUNTER — Telehealth: Payer: Self-pay | Admitting: Physician Assistant

## 2022-12-09 ENCOUNTER — Telehealth: Payer: Self-pay

## 2022-12-09 NOTE — Telephone Encounter (Signed)
-----   Message from Ulyses Amor, [place] sent at 12/02/2022  7:46 AM EST ----- F/U in 4 weeks with ABI and left LE arterial duplex on a Tuesday in [place] clinic

## 2022-12-09 NOTE — Telephone Encounter (Signed)
Pt's wife called and stated pt is unable to come in tomorrow to be seen and wants to know if it can be r/s to next week. I talked to Dr. Sharol Given who stated it was ok to wait to be seen. I advised wife per Dr. Sharol Given to take off bandage and clean wound with soap and water and reapply bandage. Wife stated she would have [facility] nurse help him with this. Appt was r/s for next wed.

## 2022-12-10 ENCOUNTER — Encounter: Payer: 59 | Admitting: Family

## 2022-12-17 ENCOUNTER — Ambulatory Visit (INDEPENDENT_AMBULATORY_CARE_PROVIDER_SITE_OTHER): Payer: 59 | Admitting: Family

## 2022-12-17 ENCOUNTER — Encounter: Payer: Self-pay | Admitting: Family

## 2022-12-17 ENCOUNTER — Telehealth: Payer: Self-pay

## 2022-12-17 DIAGNOSIS — I96 Gangrene, not elsewhere classified: Secondary | ICD-10-CM

## 2022-12-17 NOTE — Telephone Encounter (Signed)
[name] with [facility] would like to confirm what shoe patient needs to wear and  that patient is non compliant with WB status, and how to move forward?  Cb# [phone].  Please advise.  Thank you

## 2022-12-17 NOTE — Progress Notes (Signed)
Post-Op Visit Note   Patient: Kelly Key           Date of Birth: July 06, 1971           MRN: [id number] Visit Date: 12/17/2022 PCP: [name], MD  Chief Complaint:  Chief Complaint  Patient presents with   Left Foot - Routine Post Op    Left second toe Amputation 12/03/22    HPI:  HPI The patient is a 52 year old gentleman seen status post left second ray amputation February 7. sutures are in place he has been doing dry dressing changes daily.  Ortho Exam On examination of the left foot the incision is approximated with sutures there is slight gaping there is no active drainage no surrounding erythema or warmth.  No tunneling  Visit Diagnoses: No diagnosis found.  Plan: Continue daily dose of cleansing.  May shower may get this wet do not submerge.  Dry dressings minimize weightbearing follow-up in 10 days for suture removal  Follow-Up Instructions: No follow-ups on file.   Imaging: No results found.  Orders:  No orders of the defined types were placed in this encounter.  No orders of the defined types were placed in this encounter.    PMFS History: Patient Active Problem List   Diagnosis Date Noted   PAD (peripheral artery disease) ([place]) 12/01/2022   Gangrene of toe of left foot ([place]) 11/28/2022   Diabetes mellitus type 2 in obese ([place]) 11/27/2022   GERD (gastroesophageal reflux disease) 11/27/2022   Flash pulmonary edema ([place]) 11/04/2022   Dysphagia 10/18/2022   Osteomyelitis of second toe of left foot ([place]) 11/13/2021   OSA (obstructive sleep apnea) 10/04/2021   Obesity hypoventilation syndrome ([place]) 10/04/2021   Organic impotence 08/16/2021   NSTEMI (non-ST elevated myocardial infarction) ([place]) 05/17/2021   Acute gout of right knee 08/03/2019   Orthostasis    Pain    Physical debility 07/16/2019   ESRD (end stage renal disease) ([place])    Anemia of chronic disease    Uncontrolled type 2 diabetes mellitus with hyperglycemia (HCC)    Volume overload  07/05/2019   Anasarca 07/03/2019   Dyslipidemia 07/03/2019   Chronic combined systolic and diastolic CHF (congestive heart failure) ([place]) 07/03/2019   Bilateral leg edema    Hypokalemia    Thromboembolism ([place]) 02/20/2019   Diabetes mellitus type 2 in nonobese ([place]) 06/12/2012   Hyponatremia 06/12/2012   Microcytic anemia 06/12/2012   Essential hypertension, benign 06/11/2012   Elevated LFTs 06/11/2012   Morbid obesity ([place]) 06/11/2012   Hepatic steatosis 06/11/2012   Past Medical History:  Diagnosis Date   Acute diastolic CHF (congestive heart failure) ([place]) 06/11/2012   EF 50-55% [facility])   Chronic kidney disease    COPD (chronic obstructive pulmonary disease) ([place])    Diabetes mellitus    A1c 11.5 (06/11/2012).   Dyspnea    Gout    Hepatic steatosis 06/11/2012   Elevated LFTs   Hyperlipemia    Malignant hypertension    Microcytic anemia 06/12/2012   Obesity     Family History  Problem Relation Age of Onset   Gout Mother    Asthma Mother    Diabetes Father    Heart failure Father    Diabetes Sister    Hypertension Brother    Pancreatic cancer Brother    Diabetes Sister     Past Surgical History:  Procedure Laterality Date   ABDOMINAL AORTOGRAM W/LOWER EXTREMITY N/A 12/01/2022   Procedure: ABDOMINAL AORTOGRAM W/LOWER EXTREMITY;  Surgeon: Marty Heck, MD;  Location: [facility];  Service: Cardiovascular;  Laterality: N/A;   AMPUTATION Left 12/03/2022   Procedure: LEFT SECOND TOE AMPUTATION;  Surgeon: Newt Minion, MD;  Location: [facility];  Service: Orthopedics;  Laterality: Left;   AV FISTULA PLACEMENT Left 11/11/2019   Procedure: ARTERIOVENOUS (AV) FISTULA CREATION LEFT BRACHIOCEPHALIC ARM;  Surgeon: Serafina Mitchell, MD;  Location: [facility];  Service: Vascular;  Laterality: Left;   COLONOSCOPY WITH PROPOFOL N/A 09/02/2021   Procedure: COLONOSCOPY WITH PROPOFOL;  Surgeon: Jonathon Bellows, MD;  Location: [facility];  Service: Gastroenterology;   Laterality: N/A;   IR FLUORO GUIDE CV LINE RIGHT  07/08/2019   IR REMOVAL TUN CV CATH W/O FL  03/23/2020   IR US GUIDE VASC ACCESS RIGHT  07/08/2019   LEFT HEART CATH AND CORONARY ANGIOGRAPHY N/A 06/12/2021   Procedure: LEFT HEART CATH AND CORONARY ANGIOGRAPHY;  Surgeon: Belva Crome, MD;  Location: [facility];  Service: Cardiovascular;  Laterality: N/A;   NO PAST SURGERIES     PERIPHERAL VASCULAR BALLOON ANGIOPLASTY Left 12/01/2022   Procedure: PERIPHERAL VASCULAR BALLOON ANGIOPLASTY;  Surgeon: Marty Heck, MD;  Location: [place] [facility];  Service: Cardiovascular;  Laterality: Left;  Anterior tibial   PERIPHERAL VASCULAR INTERVENTION Left 12/01/2022   Procedure: PERIPHERAL VASCULAR INTERVENTION;  Surgeon: Marty Heck, MD;  Location: [place] [facility] [facility];  Service: Cardiovascular;  Laterality: Left;  sfa   VASCULAR SURGERY     Social History   Occupational History   Occupation: retired  Tobacco Use   Smoking status: Never    Passive exposure: Past   Smokeless tobacco: Never  Vaping Use   Vaping Use: Never used  Substance and Sexual Activity   Alcohol use: Not Currently    Comment: haven't drank in over 5 years    Drug use: No   Sexual activity: Not Currently

## 2022-12-18 ENCOUNTER — Encounter: Payer: Self-pay | Admitting: Physician Assistant

## 2022-12-19 NOTE — Telephone Encounter (Signed)
This was an [place] call. Patient is non compliant and will not use assistive devices and walks full WTB on foot. Chrys Racer has tried teaching/showing pt how to transfer with a wheelchair and he told her that he will not do that. FYI, has appt next week can address then with him.

## 2022-12-19 NOTE — Telephone Encounter (Signed)
Post op shoe or CAM, whatever he was given  And TDWTB for transfers only Minimize WTB

## 2022-12-24 ENCOUNTER — Ambulatory Visit: Payer: 59 | Admitting: Family

## 2022-12-24 ENCOUNTER — Ambulatory Visit: Payer: 59 | Admitting: Internal Medicine

## 2022-12-26 ENCOUNTER — Other Ambulatory Visit: Payer: Self-pay | Admitting: *Deleted

## 2022-12-26 DIAGNOSIS — R600 Localized edema: Secondary | ICD-10-CM

## 2022-12-26 DIAGNOSIS — I739 Peripheral vascular disease, unspecified: Secondary | ICD-10-CM

## 2022-12-31 ENCOUNTER — Encounter: Payer: Self-pay | Admitting: Family

## 2022-12-31 ENCOUNTER — Ambulatory Visit (INDEPENDENT_AMBULATORY_CARE_PROVIDER_SITE_OTHER): Payer: 59 | Admitting: Family

## 2022-12-31 DIAGNOSIS — S98132A Complete traumatic amputation of one left lesser toe, initial encounter: Secondary | ICD-10-CM

## 2022-12-31 DIAGNOSIS — Z89422 Acquired absence of other left toe(s): Secondary | ICD-10-CM

## 2022-12-31 MED ORDER — NITROGLYCERIN 0.2 MG/HR TD PT24: 0.2000 mg | MEDICATED_PATCH | Freq: Every day | TRANSDERMAL | 2 refills | Status: DC

## 2022-12-31 MED ORDER — SULFAMETHOXAZOLE-TRIMETHOPRIM 800-160 MG PO TABS: 1.0000 | ORAL_TABLET | Freq: Two times a day (BID) | ORAL | 0 refills | Status: DC

## 2022-12-31 NOTE — Progress Notes (Signed)
Post-Op Visit Note   Patient: Kelly Key           Date of Birth: Mar 15, 1971           MRN: [id number] Visit Date: 12/31/2022 PCP: [name], MD  Chief Complaint:  Chief Complaint  Patient presents with   Left Foot - Routine Post Op    12/03/2022 left toe amputation     HPI:  HPI The patient is a 52 year old gentleman seen 4 weeks status post left toe amputation partial ray amputation he has been doing dry dressing changes weightbearing in a postop shoe  Has vascular follow up on 3/12 Ortho Exam On examination of the left foot there is mild warmth to the foot with edema.  the incision is approximated with sutures there is healing proximally over the dorsum of the foot as well as the plantar aspect of the incision unfortunately in the webspace this has gaped 2 mm there is scant fibrinous tissue no active drainage no erythema   Following suture removal there is 1 drop of purulent drainage from a plantar suture portal.   Visit Diagnoses: No diagnosis found.  Plan: Will place on a course of Bactrim.  Provided prescription for nitroglycerin patches.  He will be following up with vascular next week following the revascularization to the left lower extremity  Follow-Up Instructions: Return in about 15 days (around 01/15/2023).   Imaging: No results found.  Orders:  No orders of the defined types were placed in this encounter.  No orders of the defined types were placed in this encounter.    PMFS History: Patient Active Problem List   Diagnosis Date Noted   PAD (peripheral artery disease) ([place]) 12/01/2022   Gangrene of toe of left foot ([place]) 11/28/2022   Diabetes mellitus type 2 in obese ([place]) 11/27/2022   GERD (gastroesophageal reflux disease) 11/27/2022   Flash pulmonary edema ([place]) 11/04/2022   Dysphagia 10/18/2022   Osteomyelitis of second toe of left foot ([place]) 11/13/2021   OSA (obstructive sleep apnea) 10/04/2021   Obesity hypoventilation syndrome ([place])  10/04/2021   Organic impotence 08/16/2021   NSTEMI (non-ST elevated myocardial infarction) ([place]) 05/17/2021   Acute gout of right knee 08/03/2019   Orthostasis    Pain    Physical debility 07/16/2019   ESRD (end stage renal disease) ([place])    Anemia of chronic disease    Uncontrolled type 2 diabetes mellitus with hyperglycemia (HCC)    Volume overload 07/05/2019   Anasarca 07/03/2019   Dyslipidemia 07/03/2019   Chronic combined systolic and diastolic CHF (congestive heart failure) ([place]) 07/03/2019   Bilateral leg edema    Hypokalemia    Thromboembolism ([place]) 02/20/2019   Diabetes mellitus type 2 in nonobese ([place]) 06/12/2012   Hyponatremia 06/12/2012   Microcytic anemia 06/12/2012   Essential hypertension, benign 06/11/2012   Elevated LFTs 06/11/2012   Morbid obesity ([place]) 06/11/2012   Hepatic steatosis 06/11/2012   Past Medical History:  Diagnosis Date   Acute diastolic CHF (congestive heart failure) ([place]) 06/11/2012   EF 50-55% [facility])   Chronic kidney disease    COPD (chronic obstructive pulmonary disease) ([place])    Diabetes mellitus    A1c 11.5 (06/11/2012).   Dyspnea    Gout    Hepatic steatosis 06/11/2012   Elevated LFTs   Hyperlipemia    Malignant hypertension    Microcytic anemia 06/12/2012   Obesity     Family History  Problem Relation Age of Onset   Gout  Mother    Asthma Mother    Diabetes Father    Heart failure Father    Diabetes Sister    Hypertension Brother    Pancreatic cancer Brother    Diabetes Sister     Past Surgical History:  Procedure Laterality Date   ABDOMINAL AORTOGRAM W/LOWER EXTREMITY N/A 12/01/2022   Procedure: ABDOMINAL AORTOGRAM W/LOWER EXTREMITY;  Surgeon: Marty Heck, MD;  Location: [facility];  Service: Cardiovascular;  Laterality: N/A;   AMPUTATION Left 12/03/2022   Procedure: LEFT SECOND TOE AMPUTATION;  Surgeon: Newt Minion, MD;  Location: [facility] [facility];  Service: Orthopedics;  Laterality: Left;   AV FISTULA  PLACEMENT Left 11/11/2019   Procedure: ARTERIOVENOUS (AV) FISTULA CREATION LEFT BRACHIOCEPHALIC ARM;  Surgeon: Serafina Mitchell, MD;  Location: [place];  Service: Vascular;  Laterality: Left;   COLONOSCOPY WITH PROPOFOL N/A 09/02/2021   Procedure: COLONOSCOPY WITH PROPOFOL;  Surgeon: Jonathon Bellows, MD;  Location: [facility];  Service: Gastroenterology;  Laterality: N/A;   IR FLUORO GUIDE CV LINE RIGHT  07/08/2019   IR REMOVAL TUN CV CATH W/O FL  03/23/2020   IR US GUIDE VASC ACCESS RIGHT  07/08/2019   LEFT HEART CATH AND CORONARY ANGIOGRAPHY N/A 06/12/2021   Procedure: LEFT HEART CATH AND CORONARY ANGIOGRAPHY;  Surgeon: Belva Crome, MD;  Location: [facility];  Service: Cardiovascular;  Laterality: N/A;   NO PAST SURGERIES     PERIPHERAL VASCULAR BALLOON ANGIOPLASTY Left 12/01/2022   Procedure: PERIPHERAL VASCULAR BALLOON ANGIOPLASTY;  Surgeon: Marty Heck, MD;  Location: [place] [facility] [facility];  Service: Cardiovascular;  Laterality: Left;  Anterior tibial   PERIPHERAL VASCULAR INTERVENTION Left 12/01/2022   Procedure: PERIPHERAL VASCULAR INTERVENTION;  Surgeon: Marty Heck, MD;  Location: [facility];  Service: Cardiovascular;  Laterality: Left;  sfa   VASCULAR SURGERY     Social History   Occupational History   Occupation: retired  Tobacco Use   Smoking status: Never    Passive exposure: Past   Smokeless tobacco: Never  Vaping Use   Vaping Use: Never used  Substance and Sexual Activity   Alcohol use: Not Currently    Comment: haven't drank in over 5 years    Drug use: No   Sexual activity: Not Currently

## 2023-01-06 ENCOUNTER — Ambulatory Visit (HOSPITAL_COMMUNITY)
Admission: RE | Admit: 2023-01-06 | Discharge: 2023-01-06 | Disposition: A | Discharge status: Home / Self Care | Payer: 59 | Source: Ambulatory Visit | Attending: Vascular Surgery | Admitting: Vascular Surgery

## 2023-01-06 ENCOUNTER — Ambulatory Visit (INDEPENDENT_AMBULATORY_CARE_PROVIDER_SITE_OTHER): Payer: 59 | Admitting: Physician Assistant

## 2023-01-06 ENCOUNTER — Ambulatory Visit (INDEPENDENT_AMBULATORY_CARE_PROVIDER_SITE_OTHER)
Admission: RE | Admit: 2023-01-06 | Discharge: 2023-01-06 | Disposition: A | Discharge status: Home / Self Care | Payer: 59 | Source: Ambulatory Visit | Attending: Vascular Surgery | Admitting: Vascular Surgery

## 2023-01-06 VITALS — BP 160/85 | HR 85 | Temp 98.0°F | Resp 20 | Ht 66.0 in | Wt 300.0 lb

## 2023-01-06 DIAGNOSIS — I739 Peripheral vascular disease, unspecified: Secondary | ICD-10-CM

## 2023-01-06 DIAGNOSIS — R600 Localized edema: Secondary | ICD-10-CM

## 2023-01-06 LAB — VAS US ABI WITH/WO TBI
Left ABI: 0.98
Right ABI: 1.05

## 2023-01-06 NOTE — Progress Notes (Signed)
HISTORY AND PHYSICAL     CC:  follow up. Requesting Provider:  [name], MD  HPI: This is a 52 y.o. male who is here today for follow up for PAD.  Pt has hx of CLI of the left leg with tissue loss and underwent angiogram with angioplasty of the left ATA, angioplasty and stent of the left SFA on 12/01/2022 by Dr. Carlis Abbott.  He then underwent left 2nd toe ray amputation on 12/03/2022 by Dr. Sharol Given.   Pt comes in today for follow up studies. He states that he thinks his toe amputation is getting better.  He states it has opened a little bit.  The last time he saw Dr. Sharol Given, they did remove some of the sutures.  He states he has a small sore on the right great toe but this is getting better.   He has hx of ESRD with hx of left BC AVF created 11/11/2019 by Dr. Trula Slade.  Pt states he had fistulogram and since then it has been working fine.  He does not have any pain in the left hand.    He states he stopped taking the Ozempic bc he had a reaction to it.   The pt is on a statin for cholesterol management.    The pt is on an aspirin.    Other AC:  Plavix The pt is on BB, clonidine for hypertension.  The pt does  have diabetes. Tobacco hx:  never    Past Medical History:  Diagnosis Date   Acute diastolic CHF (congestive heart failure) ([place]) 06/11/2012   EF 50-55% [facility])   Chronic kidney disease    COPD (chronic obstructive pulmonary disease) (HCC)    Diabetes mellitus    A1c 11.5 (06/11/2012).   Dyspnea    Gout    Hepatic steatosis 06/11/2012   Elevated LFTs   Hyperlipemia    Malignant hypertension    Microcytic anemia 06/12/2012   Obesity     Past Surgical History:  Procedure Laterality Date   ABDOMINAL AORTOGRAM W/LOWER EXTREMITY N/A 12/01/2022   Procedure: ABDOMINAL AORTOGRAM W/LOWER EXTREMITY;  Surgeon: Marty Heck, MD;  Location: [place] [facility];  Service: Cardiovascular;  Laterality: N/A;   AMPUTATION Left 12/03/2022   Procedure: LEFT SECOND TOE AMPUTATION;   Surgeon: Newt Minion, MD;  Location: [place];  Service: Orthopedics;  Laterality: Left;   AV FISTULA PLACEMENT Left 11/11/2019   Procedure: ARTERIOVENOUS (AV) FISTULA CREATION LEFT BRACHIOCEPHALIC ARM;  Surgeon: Serafina Mitchell, MD;  Location: [place];  Service: Vascular;  Laterality: Left;   COLONOSCOPY WITH PROPOFOL N/A 09/02/2021   Procedure: COLONOSCOPY WITH PROPOFOL;  Surgeon: Jonathon Bellows, MD;  Location: [facility] ENDOSCOPY;  Service: Gastroenterology;  Laterality: N/A;   IR FLUORO GUIDE CV LINE RIGHT  07/08/2019   IR REMOVAL TUN CV CATH W/O FL  03/23/2020   IR US GUIDE VASC ACCESS RIGHT  07/08/2019   LEFT HEART CATH AND CORONARY ANGIOGRAPHY N/A 06/12/2021   Procedure: LEFT HEART CATH AND CORONARY ANGIOGRAPHY;  Surgeon: Belva Crome, MD;  Location: [place] [facility];  Service: Cardiovascular;  Laterality: N/A;   NO PAST SURGERIES     PERIPHERAL VASCULAR BALLOON ANGIOPLASTY Left 12/01/2022   Procedure: PERIPHERAL VASCULAR BALLOON ANGIOPLASTY;  Surgeon: Marty Heck, MD;  Location: [place] [facility];  Service: Cardiovascular;  Laterality: Left;  Anterior tibial   PERIPHERAL VASCULAR INTERVENTION Left 12/01/2022   Procedure: PERIPHERAL VASCULAR INTERVENTION;  Surgeon: Marty Heck, MD;  Location: [place] [facility];  Service: Cardiovascular;  Laterality: Left;  sfa   VASCULAR SURGERY      Allergies  Allergen Reactions   Ozempic (0.25 Or 0.5 Mg-Dose) [Semaglutide(0.25 Or 0.'5mg'$ -Dos)] Shortness Of Breath   Hydralazine Anxiety    Hallucinations    Lisinopril Swelling    Current Outpatient Medications  Medication Sig Dispense Refill   acetaminophen (TYLENOL) 500 MG tablet Take 1,000 mg by mouth every 6 (six) hours as needed for moderate pain or headache.     albuterol (VENTOLIN HFA) 108 (90 Base) MCG/ACT inhaler Inhale 2 puffs into the lungs every 4 (four) hours as needed for wheezing or shortness of breath.      allopurinol (ZYLOPRIM) 100 MG tablet Take 2 tablets (200 mg total) by  mouth daily. 30 tablet 0   AMBULATORY NON FORMULARY MEDICATION Inject 0.2 ml by intracavernosal route as directed.  Medication Name: TriMix PGE 10 mcg Pap   30 mg Phent 1 mg 5 mL 0   amLODipine (NORVASC) 5 MG tablet Take 5 mg by mouth daily.     ammonium lactate (AMLACTIN) 12 % cream Apply 1 Application topically in the morning and at bedtime.     aspirin EC 81 MG EC tablet Take 1 tablet (81 mg total) by mouth daily. 30 tablet 0   atorvastatin (LIPITOR) 80 MG tablet Take 1 tablet (80 mg total) by mouth daily. 90 tablet 3   Calcium Carbonate Antacid (CALCIUM CARBONATE, DOSED IN MG ELEMENTAL CALCIUM,) 1250 MG/5ML SUSP Take 5 mLs (500 mg of elemental calcium total) by mouth every 6 (six) hours as needed for indigestion. 450 mL    carvedilol (COREG) 25 MG tablet Take 1 tablet (25 mg total) by mouth 2 (two) times daily with a meal. 60 tablet 0   cloNIDine (CATAPRES) 0.3 MG tablet Take 0.3 mg by mouth 3 (three) times daily.     clopidogrel (PLAVIX) 75 MG tablet Take 1 tablet (75 mg total) by mouth daily. 30 tablet 1   esomeprazole (NEXIUM) 20 MG capsule Take 20 mg by mouth daily.     ezetimibe (ZETIA) 10 MG tablet Take 10 mg by mouth every evening.     fluticasone (FLONASE) 50 MCG/ACT nasal spray Place 1 spray into both nostrils daily as needed for allergies or rhinitis.     gabapentin (NEURONTIN) 100 MG capsule Take 1 capsule (100 mg total) by mouth 3 (three) times daily. 90 capsule 0   glucose blood (ACCU-CHEK AVIVA PLUS) test strip Use as instructed TO CHECK BLOOD GLUCOSE THREE TIMES DAILY 200 each 3   guaiFENesin-dextromethorphan (ROBITUSSIN DM) 100-10 MG/5ML syrup Take 10 mLs by mouth every 8 (eight) hours. 118 mL 0   HUMALOG KWIKPEN 100 UNIT/ML KwikPen Inject 8-14 Units into the skin 3 (three) times daily.     hydrOXYzine (ATARAX) 25 MG tablet Take 25 mg by mouth 3 (three) times daily as needed for anxiety.     Insulin Pen Needle (B-D ULTRAFINE III SHORT PEN) 31G X 8 MM MISC 1 each by Does not  apply route as directed. 100 each 3   Ipratropium-Albuterol (COMBIVENT) 20-100 MCG/ACT AERS respimat Inhale 1 puff into the lungs every 6 (six) hours as needed for wheezing or shortness of breath. 1 each 0   LEVEMIR FLEXTOUCH 100 UNIT/ML FlexTouch Pen INJECT 50 UNITS SUBCUTANEOUSLY AT BEDTIME (Patient taking differently: Inject 50 Units into the skin at bedtime.) 15 mL 0   linaclotide (LINZESS) 145 MCG CAPS capsule Take 1 capsule (145  mcg total) by mouth daily before breakfast. (Patient not taking: Reported on 11/04/2022) 30 capsule 0   multivitamin (RENA-VIT) TABS tablet Take 1 tablet by mouth at bedtime. 30 tablet 0   mupirocin ointment (BACTROBAN) 2 % Apply 1 Application topically 2 (two) times daily.     nitroGLYCERIN (NITRODUR - DOSED IN MG/24 HR) 0.2 mg/hr patch Place 1 patch (0.2 mg total) onto the skin daily. 30 patch 2   OZEMPIC, 1 MG/DOSE, 4 MG/3ML SOPN Inject 1 mg into the skin once a week. Fridays.     sevelamer carbonate (RENVELA) 800 MG tablet Take 3 tablets (2,400 mg total) by mouth 3 (three) times daily with meals. 270 tablet 0   sulfamethoxazole-trimethoprim (BACTRIM DS) 800-160 MG tablet Take 1 tablet by mouth 2 (two) times daily. 20 tablet 0   vardenafil (LEVITRA) 20 MG tablet Take 1 tablet (20 mg total) by mouth daily as needed for erectile dysfunction. 6 tablet 11   Current Facility-Administered Medications  Medication Dose Route Frequency Provider Last Rate Last Admin   0.9 %  sodium chloride infusion   Intravenous PRN Marcello Fennel, PA-C        Family History  Problem Relation Age of Onset   Gout Mother    Asthma Mother    Diabetes Father    Heart failure Father    Diabetes Sister    Hypertension Brother    Pancreatic cancer Brother    Diabetes Sister     Social History   Socioeconomic History   Marital status: Single    Spouse name: Not on file   Number of children: Not on file   Years of education: Not on file   Highest education level: Not on file   Occupational History   Occupation: retired  Tobacco Use   Smoking status: Never    Passive exposure: Past   Smokeless tobacco: Never  Vaping Use   Vaping Use: Never used  Substance and Sexual Activity   Alcohol use: Not Currently    Comment: haven't drank in over 5 years    Drug use: No   Sexual activity: Not Currently  Other Topics Concern   Not on file  Social History Narrative   Not on file   Social Determinants of Health   Financial Resource Strain: Low Risk  (02/20/2019)   Overall Financial Resource Strain (CARDIA)    Difficulty of Paying Living Expenses: Not hard at all  Food Insecurity: No Food Insecurity (11/26/2022)   Hunger Vital Sign    Worried About Running Out of Food in the Last Year: Never true    [place] in the Last Year: Never true  Transportation Needs: No Transportation Needs (11/26/2022)   PRAPARE - Hydrologist (Medical): No    Lack of Transportation (Non-Medical): No  Physical Activity: Unknown (02/20/2019)   Exercise Vital Sign    Days of Exercise per Week: Patient refused    Minutes of Exercise per Session: Patient refused  Stress: No Stress Concern Present (02/20/2019)   [place]    Feeling of Stress : Not at all  Social Connections: Unknown (02/20/2019)   Social Connection and Isolation Panel [NHANES]    Frequency of Communication with Friends and Family: Patient refused    Frequency of Social Gatherings with Friends and Family: Patient refused    Attends Religious Services: Patient refused    Marine scientist or Organizations: Patient  refused    Attends Archivist Meetings: Patient refused    Marital Status: Patient refused  Intimate Partner Violence: Not At Risk (11/26/2022)   Humiliation, Afraid, Rape, and Kick questionnaire    Fear of Current or Ex-Partner: No    Emotionally Abused: No    Physically Abused: No    Sexually  Abused: No     REVIEW OF SYSTEMS:   '[X]'$  denotes positive finding, '[ ]'$  denotes negative finding Cardiac  Comments:  Chest pain or chest pressure:    Shortness of breath upon exertion:    Short of breath when lying flat:    Irregular heart rhythm:        Vascular    Pain in calf, thigh, or hip brought on by ambulation:    Pain in feet at night that wakes you up from your sleep:     Blood clot in your veins:    Leg swelling:         Pulmonary    Oxygen at home:    Productive cough:     Wheezing:         Neurologic    Sudden weakness in arms or legs:     Sudden numbness in arms or legs:     Sudden onset of difficulty speaking or slurred speech:    Temporary loss of vision in one eye:     Problems with dizziness:         Gastrointestinal    Blood in stool:     Vomited blood:         Genitourinary    Burning when urinating:     Blood in urine:        Psychiatric    Major depression:         Hematologic    Bleeding problems:    Problems with blood clotting too easily:        Skin    Rashes or ulcers:        Constitutional    Fever or chills:      PHYSICAL EXAMINATION:  Today's Vitals   01/06/23 1425 01/06/23 1426  BP: (!) 160/85   Pulse: 85   Resp: 20   Temp: 98 F (36.7 C)   TempSrc: Temporal   SpO2: 98%   Weight: 300 lb (136.1 kg)   Height: '5\' 6"'$  (1.676 m)   PainSc: 7  7   PainLoc: Abdomen    Body mass index is 48.42 kg/m.   General:  WDWN in NAD; vital signs documented above Gait: Not observed HENT: WNL, normocephalic Pulmonary: normal non-labored breathing , without wheezing Cardiac: regular HR, without carotid bruits Abdomen: soft, NT; Skin: without rashes Vascular Exam/Pulses: Easily palpable bilateral radial pulses and DP pulses Right groin is soft without palpable mass Extremities: Right great toe   Left foot  +thrill/bruit in left arm AVF Musculoskeletal: no muscle wasting or atrophy  Neurologic: A&O X 3 Psychiatric:  The  pt has Normal affect.   Non-Invasive Vascular Imaging:   ABI's/TBI's on 01/06/2023: Right:  1.05/0.70 - Great toe pressure: 114 Left:  0.98/0.66 - Great toe pressure: 107  Arterial duplex on 01/06/2023: +-----------+--------+-----+--------+---------+------------------+  LEFT      PSV cm/sRatioStenosisWaveform Comments            +-----------+--------+-----+--------+---------+------------------+  CFA Distal 151                  triphasic                    +-----------+--------+-----+--------+---------+------------------+  DFA       61                   biphasic                     +-----------+--------+-----+--------+---------+------------------+  SFA Prox   115                  triphasic                    +-----------+--------+-----+--------+---------+------------------+  POP Distal 75                   triphasic                    +-----------+--------+-----+--------+---------+------------------+  TP Trunk   87                   triphasic                    +-----------+--------+-----+--------+---------+------------------+  ATA Distal 115                  triphasic                    +-----------+--------+-----+--------+---------+------------------+  PTA Distal 75                   triphasic                    +-----------+--------+-----+--------+---------+------------------+  PERO Distal                              Unable to insonate  +-----------+--------+-----+--------+---------+------------------+   Left Stent(s):  +---------------+--------+--------+---------+--------+  SFA           PSV cm/sStenosisWaveform Comments  +---------------+--------+--------+---------+--------+  Prox to Stent  92              triphasic          +---------------+--------+--------+---------+--------+  Proximal Stent 119             triphasic          +---------------+--------+--------+---------+--------+  Mid Stent       72              triphasic          +---------------+--------+--------+---------+--------+  Distal Stent   80              triphasic          +---------------+--------+--------+---------+--------+  Distal to Stent81              triphasic          +---------------+--------+--------+---------+--------+    Summary:  Left: Patent stent with no evidence of stenosis in the superficial femoral  artery.     ASSESSMENT/PLAN:: 52 y.o. male here for follow up for PAD with hx of  angiogram with angioplasty of the left ATA, angioplasty and stent of the left SFA on 12/01/2022 by Dr. Carlis Abbott.  He then underwent left 2nd toe ray amputation on 12/03/2022 by Dr. Sharol Given.    -pt ABI much improved and he has easily palpable DP pulses bilaterally -toe amputation site appears to be healing.  He has appt with Dr. Sharol Given next week -continue asa/statin/plavix -pt will f/u in 6 months with LLE arterial duplex and ABI.   Leontine Locket, [facility] [phone]  Clinic MD:   Carlis Abbott

## 2023-01-07 ENCOUNTER — Other Ambulatory Visit: Payer: Self-pay

## 2023-01-07 DIAGNOSIS — R600 Localized edema: Secondary | ICD-10-CM

## 2023-01-07 DIAGNOSIS — I739 Peripheral vascular disease, unspecified: Secondary | ICD-10-CM

## 2023-01-11 ENCOUNTER — Other Ambulatory Visit: Payer: Self-pay

## 2023-01-11 ENCOUNTER — Encounter (HOSPITAL_COMMUNITY): Payer: Self-pay

## 2023-01-11 ENCOUNTER — Inpatient Hospital Stay (HOSPITAL_COMMUNITY)
Admission: EM | Admit: 2023-01-11 | Discharge: 2023-01-13 | DRG: 291 | Disposition: A | Discharge status: Home Health | Payer: 59 | Attending: Internal Medicine | Admitting: Internal Medicine

## 2023-01-11 ENCOUNTER — Emergency Department (HOSPITAL_COMMUNITY): Payer: 59

## 2023-01-11 DIAGNOSIS — Z8249 Family history of ischemic heart disease and other diseases of the circulatory system: Secondary | ICD-10-CM

## 2023-01-11 DIAGNOSIS — Z6841 Body Mass Index (BMI) 40.0 and over, adult: Secondary | ICD-10-CM

## 2023-01-11 DIAGNOSIS — Z833 Family history of diabetes mellitus: Secondary | ICD-10-CM

## 2023-01-11 DIAGNOSIS — Z794 Long term (current) use of insulin: Secondary | ICD-10-CM

## 2023-01-11 DIAGNOSIS — E871 Hypo-osmolality and hyponatremia: Secondary | ICD-10-CM | POA: Diagnosis present

## 2023-01-11 DIAGNOSIS — K760 Fatty (change of) liver, not elsewhere classified: Secondary | ICD-10-CM | POA: Diagnosis present

## 2023-01-11 DIAGNOSIS — K219 Gastro-esophageal reflux disease without esophagitis: Secondary | ICD-10-CM | POA: Diagnosis present

## 2023-01-11 DIAGNOSIS — M898X9 Other specified disorders of bone, unspecified site: Secondary | ICD-10-CM | POA: Diagnosis present

## 2023-01-11 DIAGNOSIS — Z91119 Patient's noncompliance with dietary regimen due to unspecified reason: Secondary | ICD-10-CM

## 2023-01-11 DIAGNOSIS — Z79899 Other long term (current) drug therapy: Secondary | ICD-10-CM

## 2023-01-11 DIAGNOSIS — E785 Hyperlipidemia, unspecified: Secondary | ICD-10-CM | POA: Diagnosis present

## 2023-01-11 DIAGNOSIS — Z992 Dependence on renal dialysis: Secondary | ICD-10-CM

## 2023-01-11 DIAGNOSIS — I5033 Acute on chronic diastolic (congestive) heart failure: Secondary | ICD-10-CM | POA: Diagnosis present

## 2023-01-11 DIAGNOSIS — R0602 Shortness of breath: Principal | ICD-10-CM

## 2023-01-11 DIAGNOSIS — E875 Hyperkalemia: Secondary | ICD-10-CM | POA: Diagnosis not present

## 2023-01-11 DIAGNOSIS — E8770 Fluid overload, unspecified: Secondary | ICD-10-CM | POA: Diagnosis present

## 2023-01-11 DIAGNOSIS — Z7902 Long term (current) use of antithrombotics/antiplatelets: Secondary | ICD-10-CM

## 2023-01-11 DIAGNOSIS — E1165 Type 2 diabetes mellitus with hyperglycemia: Secondary | ICD-10-CM | POA: Diagnosis present

## 2023-01-11 DIAGNOSIS — N2581 Secondary hyperparathyroidism of renal origin: Secondary | ICD-10-CM | POA: Diagnosis present

## 2023-01-11 DIAGNOSIS — N186 End stage renal disease: Secondary | ICD-10-CM

## 2023-01-11 DIAGNOSIS — I132 Hypertensive heart and chronic kidney disease with heart failure and with stage 5 chronic kidney disease, or end stage renal disease: Secondary | ICD-10-CM | POA: Diagnosis not present

## 2023-01-11 DIAGNOSIS — J449 Chronic obstructive pulmonary disease, unspecified: Secondary | ICD-10-CM | POA: Diagnosis present

## 2023-01-11 DIAGNOSIS — Z1152 Encounter for screening for COVID-19: Secondary | ICD-10-CM

## 2023-01-11 DIAGNOSIS — Z825 Family history of asthma and other chronic lower respiratory diseases: Secondary | ICD-10-CM

## 2023-01-11 DIAGNOSIS — D631 Anemia in chronic kidney disease: Secondary | ICD-10-CM | POA: Diagnosis present

## 2023-01-11 DIAGNOSIS — Z888 Allergy status to other drugs, medicaments and biological substances status: Secondary | ICD-10-CM

## 2023-01-11 DIAGNOSIS — E1122 Type 2 diabetes mellitus with diabetic chronic kidney disease: Secondary | ICD-10-CM | POA: Diagnosis present

## 2023-01-11 DIAGNOSIS — M109 Gout, unspecified: Secondary | ICD-10-CM | POA: Diagnosis present

## 2023-01-11 DIAGNOSIS — E1142 Type 2 diabetes mellitus with diabetic polyneuropathy: Secondary | ICD-10-CM | POA: Diagnosis present

## 2023-01-11 DIAGNOSIS — Z7985 Long-term (current) use of injectable non-insulin antidiabetic drugs: Secondary | ICD-10-CM

## 2023-01-11 DIAGNOSIS — Z7982 Long term (current) use of aspirin: Secondary | ICD-10-CM

## 2023-01-11 DIAGNOSIS — E1151 Type 2 diabetes mellitus with diabetic peripheral angiopathy without gangrene: Secondary | ICD-10-CM | POA: Diagnosis present

## 2023-01-11 DIAGNOSIS — Z800 Family history of malignant neoplasm of digestive organs: Secondary | ICD-10-CM

## 2023-01-11 DIAGNOSIS — Z89422 Acquired absence of other left toe(s): Secondary | ICD-10-CM

## 2023-01-11 LAB — CBC WITH DIFFERENTIAL/PLATELET
Abs Immature Granulocytes: 0.02 10*3/uL (ref 0.00–0.07)
Basophils Absolute: 0 10*3/uL (ref 0.0–0.1)
Basophils Relative: 0 %
Eosinophils Absolute: 0.2 10*3/uL (ref 0.0–0.5)
Eosinophils Relative: 3 %
HCT: 28 % — ABNORMAL LOW (ref 39.0–52.0)
Hemoglobin: 8.6 g/dL — ABNORMAL LOW (ref 13.0–17.0)
Immature Granulocytes: 0 %
Lymphocytes Relative: 10 %
Lymphs Abs: 1 10*3/uL (ref 0.7–4.0)
MCH: 28.5 pg (ref 26.0–34.0)
MCHC: 30.7 g/dL (ref 30.0–36.0)
MCV: 92.7 fL (ref 80.0–100.0)
Monocytes Absolute: 0.9 10*3/uL (ref 0.1–1.0)
Monocytes Relative: 10 %
Neutro Abs: 7.5 10*3/uL (ref 1.7–7.7)
Neutrophils Relative %: 77 %
Platelets: 227 10*3/uL (ref 150–400)
RBC: 3.02 MIL/uL — ABNORMAL LOW (ref 4.22–5.81)
RDW: 15.5 % (ref 11.5–15.5)
WBC: 9.7 10*3/uL (ref 4.0–10.5)
nRBC: 0 % (ref 0.0–0.2)

## 2023-01-11 LAB — RESP PANEL BY RT-PCR (RSV, FLU A&B, COVID)  RVPGX2
Influenza A by PCR: NEGATIVE
Influenza B by PCR: NEGATIVE
Resp Syncytial Virus by PCR: NEGATIVE
SARS Coronavirus 2 by RT PCR: NEGATIVE

## 2023-01-11 NOTE — ED Provider Notes (Signed)
[place]  Provider Note  CSN: [id number] Arrival date & time: 01/11/23 2156  History Chief Complaint  Patient presents with   Shortness of Breath    Kelly Key is a 52 y.o. male with history of ESRD on HD TThSa with CHF reports onset of SOB with lying down earlier tonight. Reports cough and subjective fever today. Has been compliant with dialysis. Does not know his dry weight. Denies chest pain. Leg swelling is at baseline. Does not wear oxygen at home but 'is trying to get that or CPAP'.   Home Medications Prior to Admission medications   Medication Sig Start Date End Date Taking? Authorizing Provider  acetaminophen (TYLENOL) 500 MG tablet Take 1,000 mg by mouth every 6 (six) hours as needed for moderate pain or headache.    [provider]  albuterol (VENTOLIN HFA) 108 (90 Base) MCG/ACT inhaler Inhale 2 puffs into the lungs every 4 (four) hours as needed for wheezing or shortness of breath.  03/24/19   [provider]  allopurinol (ZYLOPRIM) 100 MG tablet Take 2 tablets (200 mg total) by mouth daily. 08/04/19   Love, Ivan Anchors, PA-C  AMBULATORY NON FORMULARY MEDICATION Inject 0.2 ml by intracavernosal route as directed.  Medication Name: TriMix PGE 10 mcg Pap   30 mg Phent 1 mg 01/01/22   Stoneking, Reece Leader., MD  amLODipine (NORVASC) 5 MG tablet Take 5 mg by mouth daily. 01/30/22   [provider]  ammonium lactate (AMLACTIN) 12 % cream Apply 1 Application topically in the morning and at bedtime. 09/08/22   [provider]  aspirin EC 81 MG EC tablet Take 1 tablet (81 mg total) by mouth daily. 08/04/19   Love, Ivan Anchors, PA-C  atorvastatin (LIPITOR) 80 MG tablet Take 1 tablet (80 mg total) by mouth daily. 12/05/19   Arnoldo Lenis, MD  Calcium Carbonate Antacid (CALCIUM CARBONATE, DOSED IN MG ELEMENTAL CALCIUM,) 1250 MG/5ML SUSP Take 5 mLs (500 mg of elemental calcium total) by mouth every 6 (six) hours  as needed for indigestion. 07/16/19   Amin, Jeanella Flattery, MD  carvedilol (COREG) 25 MG tablet Take 1 tablet (25 mg total) by mouth 2 (two) times daily with a meal. 08/03/19   Love, Ivan Anchors, PA-C  cloNIDine (CATAPRES) 0.3 MG tablet Take 0.3 mg by mouth 3 (three) times daily.    [provider]  clopidogrel (PLAVIX) 75 MG tablet Take 1 tablet (75 mg total) by mouth daily. 12/06/22 02/04/23  Elodia Florence., MD  esomeprazole (NEXIUM) 20 MG capsule Take 20 mg by mouth daily. 01/23/22   [provider]  ezetimibe (ZETIA) 10 MG tablet Take 10 mg by mouth every evening. 01/20/19   [provider]  fluticasone (FLONASE) 50 MCG/ACT nasal spray Place 1 spray into both nostrils daily as needed for allergies or rhinitis.    [provider]  gabapentin (NEURONTIN) 100 MG capsule Take 1 capsule (100 mg total) by mouth 3 (three) times daily. 08/03/19   Love, Ivan Anchors, PA-C  glucose blood (ACCU-CHEK AVIVA PLUS) test strip Use as instructed TO CHECK BLOOD GLUCOSE THREE TIMES DAILY 08/15/20   Nida, Marella Chimes, MD  guaiFENesin-dextromethorphan (ROBITUSSIN DM) 100-10 MG/5ML syrup Take 10 mLs by mouth every 8 (eight) hours. 11/05/22   Manuella Ghazi, Pratik D, DO  HUMALOG KWIKPEN 100 UNIT/ML KwikPen Inject 8-14 Units into the skin 3 (three) times daily. 08/19/22   [provider]  hydrOXYzine (ATARAX)  25 MG tablet Take 25 mg by mouth 3 (three) times daily as needed for anxiety. 11/28/21   [provider]  Insulin Pen Needle (B-D ULTRAFINE III SHORT PEN) 31G X 8 MM MISC 1 each by Does not apply route as directed. 08/13/20   Cassandria Anger, MD  Ipratropium-Albuterol (COMBIVENT) 20-100 MCG/ACT AERS respimat Inhale 1 puff into the lungs every 6 (six) hours as needed for wheezing or shortness of breath. 11/05/22 12/05/22  Manuella Ghazi, Pratik D, DO  LEVEMIR FLEXTOUCH 100 UNIT/ML FlexTouch Pen INJECT 50 UNITS SUBCUTANEOUSLY AT BEDTIME Patient taking differently: Inject 50 Units into  the skin at bedtime. 07/09/21   Brita Romp, NP  linaclotide Rolan Lipa) 145 MCG CAPS capsule Take 1 capsule (145 mcg total) by mouth daily before breakfast. Patient not taking: Reported on 11/04/2022 04/05/18   Heath Lark D, DO  multivitamin (RENA-VIT) TABS tablet Take 1 tablet by mouth at bedtime. 08/03/19   Love, Ivan Anchors, PA-C  mupirocin ointment (BACTROBAN) 2 % Apply 1 Application topically 2 (two) times daily.    [provider]  nitroGLYCERIN (NITRODUR - DOSED IN MG/24 HR) 0.2 mg/hr patch Place 1 patch (0.2 mg total) onto the skin daily. 12/31/22   Suzan Slick, NP  OZEMPIC, 1 MG/DOSE, 4 MG/3ML SOPN Inject 1 mg into the skin once a week. Fridays.    [provider]  sevelamer carbonate (RENVELA) 800 MG tablet Take 3 tablets (2,400 mg total) by mouth 3 (three) times daily with meals. 08/03/19   Love, Ivan Anchors, PA-C  sulfamethoxazole-trimethoprim (BACTRIM DS) 800-160 MG tablet Take 1 tablet by mouth 2 (two) times daily. 12/31/22   Suzan Slick, NP  vardenafil (LEVITRA) 20 MG tablet Take 1 tablet (20 mg total) by mouth daily as needed for erectile dysfunction. 08/16/21   Stoneking, Reece Leader., MD     Allergies    Ozempic (0.25 or 0.5 mg-dose) [semaglutide(0.25 or 0.5mg -dos)], Hydralazine, and Lisinopril   Review of Systems   Review of Systems Please see HPI for pertinent positives and negatives  Physical Exam BP (!) 161/77 (BP Location: Right Arm)   Pulse 99   Temp 98.1 F (36.7 C) (Oral)   Resp 20   Ht 5\' 11"  (1.803 m)   Wt (!) 142.9 kg   SpO2 98%   BMI 43.93 kg/m   Physical Exam Vitals and nursing note reviewed.  Constitutional:      Appearance: Normal appearance.  HENT:     Head: Normocephalic and atraumatic.     Nose: Nose normal.     Mouth/Throat:     Mouth: Mucous membranes are moist.  Eyes:     Extraocular Movements: Extraocular movements intact.     Conjunctiva/sclera: Conjunctivae normal.  Cardiovascular:     Rate and Rhythm: Normal rate.   Pulmonary:     Effort: Pulmonary effort is normal. No tachypnea.     Breath sounds: Normal breath sounds. No wheezing or rales.  Abdominal:     General: Abdomen is flat.     Palpations: Abdomen is soft.     Tenderness: There is no abdominal tenderness.  Musculoskeletal:        General: No swelling. Normal range of motion.     Cervical back: Neck supple.     Right lower leg: Edema present.     Left lower leg: Edema present.     Comments: L foot wrapped in bandage from recent amputation  Skin:    General: Skin is warm and dry.  Neurological:     General: No focal deficit present.     Mental Status: He is alert.  Psychiatric:        Mood and Affect: Mood normal.     ED Results / Procedures / Treatments   EKG None  Procedures Procedures  Medications Ordered in the ED Medications - No data to display  Initial Impression and Plan  Patient here with SOB, orthopnea. Exam and vitals are reassuring, no distress and no hypoxia, but he is requesting oxygen for comfort. Will check labs, CXR, EKG and reassess.   ED Course       MDM Rules/Calculators/A&P Medical Decision Making Amount and/or Complexity of Data Reviewed Labs: ordered. Radiology: ordered.     Final Clinical Impression(s) / ED Diagnoses Final diagnoses:  None    Rx / DC Orders ED Discharge Orders     None

## 2023-01-11 NOTE — ED Notes (Signed)
XR at Bedside.  

## 2023-01-11 NOTE — ED Triage Notes (Signed)
Complaining of shortness of breath that started when he tried to lay down. Said he feels hot. Is congested.

## 2023-01-12 ENCOUNTER — Inpatient Hospital Stay (HOSPITAL_COMMUNITY): Payer: 59

## 2023-01-12 ENCOUNTER — Encounter (HOSPITAL_COMMUNITY): Payer: Self-pay | Admitting: Internal Medicine

## 2023-01-12 DIAGNOSIS — E1142 Type 2 diabetes mellitus with diabetic polyneuropathy: Secondary | ICD-10-CM | POA: Diagnosis present

## 2023-01-12 DIAGNOSIS — I5033 Acute on chronic diastolic (congestive) heart failure: Secondary | ICD-10-CM

## 2023-01-12 DIAGNOSIS — E875 Hyperkalemia: Secondary | ICD-10-CM

## 2023-01-12 DIAGNOSIS — Z6841 Body Mass Index (BMI) 40.0 and over, adult: Secondary | ICD-10-CM | POA: Diagnosis not present

## 2023-01-12 DIAGNOSIS — E1151 Type 2 diabetes mellitus with diabetic peripheral angiopathy without gangrene: Secondary | ICD-10-CM | POA: Diagnosis present

## 2023-01-12 DIAGNOSIS — Z91119 Patient's noncompliance with dietary regimen due to unspecified reason: Secondary | ICD-10-CM | POA: Diagnosis not present

## 2023-01-12 DIAGNOSIS — K760 Fatty (change of) liver, not elsewhere classified: Secondary | ICD-10-CM | POA: Diagnosis present

## 2023-01-12 DIAGNOSIS — N2581 Secondary hyperparathyroidism of renal origin: Secondary | ICD-10-CM | POA: Diagnosis present

## 2023-01-12 DIAGNOSIS — D631 Anemia in chronic kidney disease: Secondary | ICD-10-CM | POA: Diagnosis present

## 2023-01-12 DIAGNOSIS — Z1152 Encounter for screening for COVID-19: Secondary | ICD-10-CM | POA: Diagnosis not present

## 2023-01-12 DIAGNOSIS — Z794 Long term (current) use of insulin: Secondary | ICD-10-CM | POA: Diagnosis not present

## 2023-01-12 DIAGNOSIS — Z79899 Other long term (current) drug therapy: Secondary | ICD-10-CM | POA: Diagnosis not present

## 2023-01-12 DIAGNOSIS — E785 Hyperlipidemia, unspecified: Secondary | ICD-10-CM | POA: Diagnosis present

## 2023-01-12 DIAGNOSIS — E871 Hypo-osmolality and hyponatremia: Secondary | ICD-10-CM | POA: Diagnosis present

## 2023-01-12 DIAGNOSIS — E1122 Type 2 diabetes mellitus with diabetic chronic kidney disease: Secondary | ICD-10-CM | POA: Diagnosis present

## 2023-01-12 DIAGNOSIS — K219 Gastro-esophageal reflux disease without esophagitis: Secondary | ICD-10-CM | POA: Diagnosis present

## 2023-01-12 DIAGNOSIS — Z992 Dependence on renal dialysis: Secondary | ICD-10-CM | POA: Diagnosis not present

## 2023-01-12 DIAGNOSIS — I132 Hypertensive heart and chronic kidney disease with heart failure and with stage 5 chronic kidney disease, or end stage renal disease: Secondary | ICD-10-CM | POA: Diagnosis present

## 2023-01-12 DIAGNOSIS — E8770 Fluid overload, unspecified: Secondary | ICD-10-CM | POA: Diagnosis not present

## 2023-01-12 DIAGNOSIS — I5031 Acute diastolic (congestive) heart failure: Secondary | ICD-10-CM

## 2023-01-12 DIAGNOSIS — Z8249 Family history of ischemic heart disease and other diseases of the circulatory system: Secondary | ICD-10-CM | POA: Diagnosis not present

## 2023-01-12 DIAGNOSIS — J449 Chronic obstructive pulmonary disease, unspecified: Secondary | ICD-10-CM | POA: Diagnosis present

## 2023-01-12 DIAGNOSIS — N186 End stage renal disease: Secondary | ICD-10-CM | POA: Diagnosis present

## 2023-01-12 DIAGNOSIS — E1165 Type 2 diabetes mellitus with hyperglycemia: Secondary | ICD-10-CM | POA: Diagnosis present

## 2023-01-12 DIAGNOSIS — Z888 Allergy status to other drugs, medicaments and biological substances status: Secondary | ICD-10-CM | POA: Diagnosis not present

## 2023-01-12 LAB — PHOSPHORUS: Phosphorus: 3.5 mg/dL (ref 2.5–4.6)

## 2023-01-12 LAB — CBC
HCT: 25.1 % — ABNORMAL LOW (ref 39.0–52.0)
Hemoglobin: 7.9 g/dL — ABNORMAL LOW (ref 13.0–17.0)
MCH: 29.3 pg (ref 26.0–34.0)
MCHC: 31.5 g/dL (ref 30.0–36.0)
MCV: 93 fL (ref 80.0–100.0)
Platelets: 202 10*3/uL (ref 150–400)
RBC: 2.7 MIL/uL — ABNORMAL LOW (ref 4.22–5.81)
RDW: 15.6 % — ABNORMAL HIGH (ref 11.5–15.5)
WBC: 7.5 10*3/uL (ref 4.0–10.5)
nRBC: 0 % (ref 0.0–0.2)

## 2023-01-12 LAB — BASIC METABOLIC PANEL
Anion gap: 11 (ref 5–15)
BUN: 51 mg/dL — ABNORMAL HIGH (ref 6–20)
CO2: 26 mmol/L (ref 22–32)
Calcium: 8.4 mg/dL — ABNORMAL LOW (ref 8.9–10.3)
Chloride: 93 mmol/L — ABNORMAL LOW (ref 98–111)
Creatinine, Ser: 8.66 mg/dL — ABNORMAL HIGH (ref 0.61–1.24)
GFR, Estimated: 7 mL/min — ABNORMAL LOW (ref >60)
Glucose, Bld: 151 mg/dL — ABNORMAL HIGH (ref 70–99)
Potassium: 6.3 mmol/L (ref 3.5–5.1)
Sodium: 130 mmol/L — ABNORMAL LOW (ref 135–145)

## 2023-01-12 LAB — ECHOCARDIOGRAM COMPLETE
AR max vel: 1.6 cm2
AV Area VTI: 1.85 cm2
AV Area mean vel: 1.81 cm2
AV Mean grad: 12 mmHg
AV Peak grad: 21.9 mmHg
Ao pk vel: 2.34 m/s
Area-P 1/2: 3.39 cm2
Height: 66 in
MV M vel: 4.49 m/s
MV Peak grad: 80.6 mmHg
MV VTI: 1.76 cm2
S' Lateral: 3.8 cm
Weight: 5041.6 oz

## 2023-01-12 LAB — TROPONIN I (HIGH SENSITIVITY)
Troponin I (High Sensitivity): 25 ng/L — ABNORMAL HIGH (ref <18)
Troponin I (High Sensitivity): 29 ng/L — ABNORMAL HIGH (ref <18)

## 2023-01-12 LAB — IRON AND TIBC
Iron: 21 ug/dL — ABNORMAL LOW (ref 45–182)
Saturation Ratios: 7 % — ABNORMAL LOW (ref 17.9–39.5)
TIBC: 305 ug/dL (ref 250–450)
UIBC: 284 ug/dL

## 2023-01-12 LAB — GLUCOSE, CAPILLARY
Glucose-Capillary: 135 mg/dL — ABNORMAL HIGH (ref 70–99)
Glucose-Capillary: 137 mg/dL — ABNORMAL HIGH (ref 70–99)
Glucose-Capillary: 228 mg/dL — ABNORMAL HIGH (ref 70–99)

## 2023-01-12 LAB — MAGNESIUM: Magnesium: 2.2 mg/dL (ref 1.7–2.4)

## 2023-01-12 LAB — COMPREHENSIVE METABOLIC PANEL
ALT: 18 U/L (ref 0–44)
AST: 21 U/L (ref 15–41)
Albumin: 3.4 g/dL — ABNORMAL LOW (ref 3.5–5.0)
Alkaline Phosphatase: 54 U/L (ref 38–126)
Anion gap: 14 (ref 5–15)
BUN: 54 mg/dL — ABNORMAL HIGH (ref 6–20)
CO2: 24 mmol/L (ref 22–32)
Calcium: 8.4 mg/dL — ABNORMAL LOW (ref 8.9–10.3)
Chloride: 94 mmol/L — ABNORMAL LOW (ref 98–111)
Creatinine, Ser: 9.34 mg/dL — ABNORMAL HIGH (ref 0.61–1.24)
GFR, Estimated: 6 mL/min — ABNORMAL LOW (ref >60)
Glucose, Bld: 176 mg/dL — ABNORMAL HIGH (ref 70–99)
Potassium: 4.8 mmol/L (ref 3.5–5.1)
Sodium: 132 mmol/L — ABNORMAL LOW (ref 135–145)
Total Bilirubin: 0.5 mg/dL (ref 0.3–1.2)
Total Protein: 7.2 g/dL (ref 6.5–8.1)

## 2023-01-12 LAB — BLOOD GAS, VENOUS
Acid-Base Excess: 2.7 mmol/L — ABNORMAL HIGH (ref 0.0–2.0)
Bicarbonate: 27.9 mmol/L (ref 20.0–28.0)
Drawn by: 6509
O2 Saturation: 70.8 %
Patient temperature: 37.2
pCO2, Ven: 44 mmHg (ref 44–60)
pH, Ven: 7.41 (ref 7.25–7.43)
pO2, Ven: 39 mmHg (ref 32–45)

## 2023-01-12 LAB — HEPATITIS B SURFACE ANTIGEN: Hepatitis B Surface Ag: NONREACTIVE

## 2023-01-12 LAB — BRAIN NATRIURETIC PEPTIDE: B Natriuretic Peptide: 660 pg/mL — ABNORMAL HIGH (ref 0.0–100.0)

## 2023-01-12 MED ORDER — PENTAFLUOROPROP-TETRAFLUOROETH EX AERO: 1.0000 | INHALATION_SPRAY | CUTANEOUS | Status: DC | PRN

## 2023-01-12 MED ORDER — SODIUM ZIRCONIUM CYCLOSILICATE 10 G PO PACK
10.0000 g | PACK | Freq: Three times a day (TID) | ORAL | Status: DC
  Administered 2023-01-12 (×2): 10 g via ORAL
  Filled 2023-01-12 (×2): qty 1

## 2023-01-12 MED ORDER — INSULIN ASPART 100 UNIT/ML IJ SOLN
0.0000 [IU] | Freq: Every day | INTRAMUSCULAR | Status: DC
  Administered 2023-01-12: 2 [IU] via SUBCUTANEOUS

## 2023-01-12 MED ORDER — FUROSEMIDE 10 MG/ML IJ SOLN
80.0000 mg | INTRAMUSCULAR | Status: AC
  Administered 2023-01-12: 80 mg via INTRAVENOUS
  Filled 2023-01-12: qty 8

## 2023-01-12 MED ORDER — MELATONIN 3 MG PO TABS: 3.0000 mg | ORAL_TABLET | Freq: Every evening | ORAL | Status: DC | PRN

## 2023-01-12 MED ORDER — ACETAMINOPHEN 325 MG PO TABS
650.0000 mg | ORAL_TABLET | Freq: Four times a day (QID) | ORAL | Status: DC | PRN
  Administered 2023-01-13: 650 mg via ORAL
  Filled 2023-01-12: qty 2

## 2023-01-12 MED ORDER — GABAPENTIN 100 MG PO CAPS
100.0000 mg | ORAL_CAPSULE | Freq: Three times a day (TID) | ORAL | Status: DC
  Administered 2023-01-12 – 2023-01-13 (×4): 100 mg via ORAL
  Filled 2023-01-12 (×4): qty 1

## 2023-01-12 MED ORDER — ALBUTEROL SULFATE HFA 108 (90 BASE) MCG/ACT IN AERS: 2.0000 | INHALATION_SPRAY | RESPIRATORY_TRACT | Status: DC | PRN

## 2023-01-12 MED ORDER — DARBEPOETIN ALFA 200 MCG/0.4ML IJ SOSY
200.0000 ug | PREFILLED_SYRINGE | INTRAMUSCULAR | Status: DC
  Filled 2023-01-12: qty 0.4

## 2023-01-12 MED ORDER — PANTOPRAZOLE SODIUM 40 MG PO TBEC
40.0000 mg | DELAYED_RELEASE_TABLET | Freq: Every day | ORAL | Status: DC
  Administered 2023-01-12 – 2023-01-13 (×2): 40 mg via ORAL
  Filled 2023-01-12 (×2): qty 1

## 2023-01-12 MED ORDER — HEPARIN SODIUM (PORCINE) 1000 UNIT/ML DIALYSIS: 20.0000 [IU]/kg | INTRAMUSCULAR | Status: DC | PRN

## 2023-01-12 MED ORDER — PROCHLORPERAZINE EDISYLATE 10 MG/2ML IJ SOLN: 5.0000 mg | Freq: Four times a day (QID) | INTRAMUSCULAR | Status: DC | PRN

## 2023-01-12 MED ORDER — POLYETHYLENE GLYCOL 3350 17 G PO PACK: 17.0000 g | PACK | Freq: Every day | ORAL | Status: DC | PRN

## 2023-01-12 MED ORDER — CHLORHEXIDINE GLUCONATE CLOTH 2 % EX PADS
6.0000 | MEDICATED_PAD | Freq: Every day | CUTANEOUS | Status: DC
  Administered 2023-01-12 – 2023-01-13 (×2): 6 via TOPICAL

## 2023-01-12 MED ORDER — CARVEDILOL 12.5 MG PO TABS
25.0000 mg | ORAL_TABLET | Freq: Two times a day (BID) | ORAL | Status: DC
  Administered 2023-01-12: 25 mg via ORAL
  Filled 2023-01-12: qty 2

## 2023-01-12 MED ORDER — HEPARIN SODIUM (PORCINE) 5000 UNIT/ML IJ SOLN
5000.0000 [IU] | Freq: Three times a day (TID) | INTRAMUSCULAR | Status: DC
  Administered 2023-01-12 – 2023-01-13 (×4): 5000 [IU] via SUBCUTANEOUS
  Filled 2023-01-12 (×5): qty 1

## 2023-01-12 MED ORDER — EZETIMIBE 10 MG PO TABS
10.0000 mg | ORAL_TABLET | Freq: Every evening | ORAL | Status: DC
  Administered 2023-01-12: 10 mg via ORAL
  Filled 2023-01-12: qty 1

## 2023-01-12 MED ORDER — ASPIRIN 81 MG PO TBEC
81.0000 mg | DELAYED_RELEASE_TABLET | Freq: Every day | ORAL | Status: DC
  Administered 2023-01-12 – 2023-01-13 (×2): 81 mg via ORAL
  Filled 2023-01-12 (×3): qty 1

## 2023-01-12 MED ORDER — INSULIN ASPART 100 UNIT/ML IJ SOLN
0.0000 [IU] | Freq: Three times a day (TID) | INTRAMUSCULAR | Status: DC
  Administered 2023-01-13: 1 [IU] via SUBCUTANEOUS

## 2023-01-12 MED ORDER — LIDOCAINE HCL (PF) 1 % IJ SOLN: 5.0000 mL | INTRAMUSCULAR | Status: DC | PRN

## 2023-01-12 MED ORDER — CLOPIDOGREL BISULFATE 75 MG PO TABS
75.0000 mg | ORAL_TABLET | Freq: Every day | ORAL | Status: DC
  Administered 2023-01-12 – 2023-01-13 (×2): 75 mg via ORAL
  Filled 2023-01-12 (×2): qty 1

## 2023-01-12 MED ORDER — HYDROXYZINE HCL 25 MG PO TABS: 25.0000 mg | ORAL_TABLET | Freq: Three times a day (TID) | ORAL | Status: DC | PRN

## 2023-01-12 MED ORDER — SODIUM ZIRCONIUM CYCLOSILICATE 5 G PO PACK
10.0000 g | PACK | Freq: Once | ORAL | Status: AC
  Administered 2023-01-12: 10 g via ORAL
  Filled 2023-01-12: qty 2

## 2023-01-12 MED ORDER — INSULIN DETEMIR 100 UNIT/ML ~~LOC~~ SOLN
20.0000 [IU] | Freq: Every day | SUBCUTANEOUS | Status: DC
  Administered 2023-01-12: 20 [IU] via SUBCUTANEOUS
  Filled 2023-01-12 (×2): qty 0.2

## 2023-01-12 MED ORDER — LIDOCAINE-PRILOCAINE 2.5-2.5 % EX CREA: 1.0000 | TOPICAL_CREAM | CUTANEOUS | Status: DC | PRN

## 2023-01-12 MED ORDER — HEPARIN SODIUM (PORCINE) 1000 UNIT/ML IJ SOLN
INTRAMUSCULAR | Status: AC
  Administered 2023-01-12: 2900 [IU] via INTRAVENOUS_CENTRAL
  Filled 2023-01-12: qty 3

## 2023-01-12 MED ORDER — ATORVASTATIN CALCIUM 40 MG PO TABS
80.0000 mg | ORAL_TABLET | Freq: Every day | ORAL | Status: DC
  Administered 2023-01-12 – 2023-01-13 (×2): 80 mg via ORAL
  Filled 2023-01-12 (×2): qty 2

## 2023-01-12 MED ORDER — PERFLUTREN LIPID MICROSPHERE
1.0000 mL | INTRAVENOUS | Status: AC | PRN
  Administered 2023-01-12: 4 mL via INTRAVENOUS

## 2023-01-12 MED ORDER — SEVELAMER CARBONATE 800 MG PO TABS
2400.0000 mg | ORAL_TABLET | Freq: Three times a day (TID) | ORAL | Status: DC
  Administered 2023-01-12 – 2023-01-13 (×5): 2400 mg via ORAL
  Filled 2023-01-12 (×5): qty 3

## 2023-01-12 NOTE — Progress Notes (Signed)
  HEMODIALYSIS TREATMENT NOTE:  Uneventful 3.5 hour treatment completed using left upper arm AVF (15g/antegrade). Goal met: 3.5 liters removed without interruption in UF.  All blood was returned.  No meds ordered/given.  Hemostasis was achieved in 20 minutes.  Post-dialysis:  01/12/23 1715  Vital Signs  Temp 97.9 F (36.6 C)  Temp Source Oral  Pulse Rate 88  Pulse Rate Source Monitor  Resp 14  BP (!) 142/74  BP Location Right Arm  BP Method Automatic  Patient Position (if appropriate) Sitting  Oxygen Therapy  SpO2 95 %  O2 Device Room Air  Pain Assessment  Pain Scale 0-10  Pain Score 0  Dialysis Weight  Weight (!) 140 kg  Type of Weight Post-Dialysis  Estimated Dry Weight 138.5 kg  Post Treatment  Dialyzer Clearance Lightly streaked  Duration of HD Treatment -hour(s) 3.5 hour(s)  Hemodialysis Intake (mL) 0 mL  Liters Processed 84  Fluid Removed (mL) 3500 mL  Tolerated HD Treatment Yes  Post-Hemodialysis Comments Goal met  AVG/AVF Arterial Site Held (minutes) 10 minutes  AVG/AVF Venous Site Held (minutes) 8 minutes  Fistula / Graft Left Upper arm Arteriovenous fistula  Placement Date/Time: 11/11/19 0843   Placed prior to admission: No  Orientation: Left  Access Location: Upper arm  Access Type: Arteriovenous fistula  Fistula / Graft Assessment Thrill;Bruit  Status Patent  Education / Care Plan  Dialysis Education Provided Yes  Documented Education in Care Plan Yes  Outpatient Plan of Care Reviewed and on Chart Yes   Pt was transported from [place] back to [facility] and report was given to Cliffton Asters, LPN.   Rockwell Alexandria, RN AP [facility]

## 2023-01-12 NOTE — Progress Notes (Signed)
PROGRESS NOTE    [name]  [id number] DOB: 04/28/1971 DOA: 01/11/2023 PCP: [name], MD    Brief Narrative:   Kelly Key is a 52 y.o. male with past medical history significant for ESRD on HD TTS, chronic diastolic congestive heart failure, type 2 diabetes mellitus, diabetic polyneuropathy, HLD, HTN, GERD who presented to [facility] ED on 3/17 with complaint of progressive shortness of breath.  Onset 2 days ago.  Reports worse with lying flat associated with orthopnea.  Reports normal hemodialysis session on Saturday.  Admits to noncompliance with dietary restrictions.  Currently awaiting outpatient sleep study for evaluation for OSA.  Denies chest pain, no palpitations.  In the ED, temperature 98.1 F, HR 99, RR 20, BP 161/77, SpO2 97% on room air.  Sodium 130, potassium 6.3, chloride 93, CO2 26, glucose 151, BUN 51, creatinine 8.66.  WBC 9.7, hemoglobin 8.6, platelets 227.  BNP 660.0.  High sensitive troponin 25 followed by 29.  Covid-19/influenza A/B/RSV PCR negative.  Chest x-ray with borderline cardiomegaly with vascular congestion consistent with pulmonary edema/volume overload.  Received IV Lasix and Lokelma in the ED.  TRH consulted for admission for further evaluation management of acute on chronic CHF exacerbation/volume overload in the setting of ESRD, elevated troponin likely secondary to type II demand ischemia and hyperkalemia.  Assessment & Plan:   Acute on chronic diastolic congestive heart failure Patient presenting to ED with progressive shortness of breath over the last 2 days in the setting of dietary restriction noncompliance.  Reports normal HD session on Saturday.  Patient was oxygenating well on room air, afebrile without leukocytosis.  Elevated BNP and chest x-ray findings consistent with pulmonary edema.  Received IV Lasix in the ED. -- Nephrology consulted for continue HD while inpatient, plan session today off schedule and resume normal schedule  tomorrow -- TTE: Pending -- Strict I's and O's and daily weights  Hyperkalemia: Resolved Potassium 6.3 on admission.  Treated with Lokelma and IV Lasix. -- K 6.3>4.8 -- For HD today -- Renal panel in a.m.  Hyponatremia Sodium 130 on admission.  Etiology likely secondary to hypervolemic hyponatremia in the setting of volume overload. --Na 130>132 --HD today  Elevated troponin Etiology likely secondary to type II demand ischemia in the setting of volume overload versus poor renal clearance in the setting of ESRD.  Denies chest pain.  EKG with normal sinus rhythm, no concerning dynamic changes. -- TTE: Pending  Anemia of chronic medical/renal disease Hemoglobin 7.9 this morning.  -- Iron, TIBC: Pending -- Repeat CBC in the a.m.  Peripheral artery disease Hx osteomyelitis second toe s/p amputation Recent underwent second toe amputation by Dr. Sharol Given.  WBC RN consulted for continue wound management per orthopedics.  Outpatient follow-up with orthopedics 01/15/2023. -- Continue aspirin, statin, Plavix  ESRD on HD TTS --Nephrology consulted for continue HD while inpatient  Essential hypertension --Carvedilol 25 mg p.o. twice daily --Hold home amlodipine/clonidine for now --Continue aspirin and statin  Hyperlipidemia -- Atorvastatin 80 mg p.o. daily -- Zetia 10 mg p.o. daily  Type 2 diabetes mellitus with hyperglycemia Home regimen includes Levemir 50 units subcutaneously nightly, Humalog sliding scale, Ozempic.  Hemoglobin A1c 7.4 on 10/18/2022. -- Levemir 20 units [place] qHS -- SSI for coverage -- CBGs qAC/HS  Peripheral neuropathy -- Gabapentin 100 mg p.o. 3 times daily  GERD -- Continue PPI  Suspected OSA versus OHS Pending outpatient sleep study.  Outpatient follow-up with PCP.  Morbid obesity Body mass index is 50.86  kg/m.  Discussed with patient needs for aggressive lifestyle changes/weight loss as this complicates all facets of care.  Outpatient follow-up with PCP.      DVT prophylaxis: heparin injection 5,000 Units Start: 01/12/23 0600    Code Status: Full Code Family Communication: No family present at bedside this morning  Disposition Plan:  Level of care: Telemetry Status is: Inpatient Remains inpatient appropriate because: Pending HD today and likely tomorrow; anticipate discharge home after HD tomorrow    Consultants:  Nephrology  Procedures:  TTE  Antimicrobials:  None   Subjective: Patient seen examined bedside, resting comfortably.  Lying in bed.  Continues to endorse shortness of breath.  Awaiting HD today.  Reports that he is frustrated he has not been set up for the sleep study in roughly 1 month outpatient yet.  Requests change of his dressing to left foot today.  No other specific questions or concerns at this time.  Denies headache, no dizziness, no chest pain, no palpitations, no fever/chills/night sweats, no nausea/vomiting/diarrhea, no abdominal pain, no cough/congestion, no focal weakness, no fatigue.  No acute concerns overnight per nursing staff.   Objective: Vitals:   01/12/23 0215 01/12/23 0303 01/12/23 0340 01/12/23 0805  BP:  115/68 121/73 137/69  Pulse: 91 85 86 98  Resp: 19 16 19 18   Temp:   98.6 F (37 C) 98.6 F (37 C)  TempSrc:   Oral Oral  SpO2: 100% 96% 96% 98%  Weight:   (!) 142.9 kg   Height:   5\' 6"  (1.676 m)     Intake/Output Summary (Last 24 hours) at 01/12/2023 0954 Last data filed at 01/12/2023 0856 Gross per 24 hour  Intake 1280 ml  Output 100 ml  Net 1180 ml   Filed Weights   01/11/23 2203 01/12/23 0340  Weight: (!) 142.9 kg (!) 142.9 kg    Examination:  Physical Exam: GEN: NAD, alert and oriented x 3, obese HEENT: NCAT, PERRL, EOMI, sclera clear, MMM PULM: CTAB w/o wheezes/crackles, normal respiratory effort, on room air CV: RRR w/o M/G/R GI: abd soft, NTND, NABS, no R/G/M MSK: 1-2+ peripheral edema, moves all extremities independently, noted dressing to left foot in place,  clean/dry/intact NEURO: CN II-XII intact, no focal deficits, sensation to light touch intact PSYCH: normal mood/affect Integumentary: Dressing noted to left foot as above, otherwise no other concerning rashes/lesions/wounds noted on exposed skin surfaces.      Data Reviewed: I have personally reviewed following labs and imaging studies  CBC: Recent Labs  Lab 01/11/23 2342 01/12/23 0456  WBC 9.7 7.5  NEUTROABS 7.5  --   HGB 8.6* 7.9*  HCT 28.0* 25.1*  MCV 92.7 93.0  PLT 227 [id number]   Basic Metabolic Panel: Recent Labs  Lab 01/11/23 2342 01/12/23 0456  NA 130* 132*  K 6.3* 4.8  CL 93* 94*  CO2 26 24  GLUCOSE 151* 176*  BUN 51* 54*  CREATININE 8.66* 9.34*  CALCIUM 8.4* 8.4*  MG  --  2.2  PHOS  --  3.5   GFR: Estimated Creatinine Clearance: 12.3 mL/min (A) (by C-G formula based on SCr of 9.34 mg/dL (H)). Liver Function Tests: Recent Labs  Lab 01/12/23 0456  AST 21  ALT 18  ALKPHOS 54  BILITOT 0.5  PROT 7.2  ALBUMIN 3.4*   No results for input(s): "LIPASE", "AMYLASE" in the last 168 hours. No results for input(s): "AMMONIA" in the last 168 hours. Coagulation Profile: No results for input(s): "INR", "PROTIME" in the last 168  hours. Cardiac Enzymes: No results for input(s): "CKTOTAL", "CKMB", "CKMBINDEX", "TROPONINI" in the last 168 hours. BNP (last 3 results) No results for input(s): "PROBNP" in the last 8760 hours. HbA1C: No results for input(s): "HGBA1C" in the last 72 hours. CBG: Recent Labs  Lab 01/12/23 0743  GLUCAP 135*   Lipid Profile: No results for input(s): "CHOL", "HDL", "LDLCALC", "TRIG", "CHOLHDL", "LDLDIRECT" in the last 72 hours. Thyroid Function Tests: No results for input(s): "TSH", "T4TOTAL", "FREET4", "T3FREE", "THYROIDAB" in the last 72 hours. Anemia Panel: No results for input(s): "VITAMINB12", "FOLATE", "FERRITIN", "TIBC", "IRON", "RETICCTPCT" in the last 72 hours. Sepsis Labs: No results for input(s): "PROCALCITON", "LATICACIDVEN"  in the last 168 hours.  Recent Results (from the past 240 hour(s))  Resp panel by RT-PCR (RSV, Flu A&B, Covid) Anterior Nasal Swab     Status: None   Collection Time: 01/11/23 11:03 PM   Specimen: Anterior Nasal Swab  Result Value Ref Range Status   SARS Coronavirus 2 by RT PCR NEGATIVE NEGATIVE Final    Comment: (NOTE) SARS-CoV-2 target nucleic acids are NOT DETECTED.  The SARS-CoV-2 RNA is generally detectable in upper respiratory specimens during the acute phase of infection. The lowest concentration of SARS-CoV-2 viral copies this assay can detect is 138 copies/mL. A negative result does not preclude SARS-Cov-2 infection and should not be used as the sole basis for treatment or other patient management decisions. A negative result may occur with  improper specimen collection/handling, submission of specimen other than nasopharyngeal swab, presence of viral mutation(s) within the areas targeted by this assay, and inadequate number of viral copies(<138 copies/mL). A negative result must be combined with clinical observations, patient history, and epidemiological information. The expected result is Negative.  Fact Sheet for Patients:  EntrepreneurPulse.com.au  Fact Sheet for Healthcare Providers:  IncredibleEmployment.be  This test is no t yet approved or cleared by the [place] FDA and  has been authorized for detection and/or diagnosis of SARS-CoV-2 by FDA under an Emergency Use Authorization (EUA). This EUA will remain  in effect (meaning this test can be used) for the duration of the COVID-19 declaration under Section 564(b)(1) of the Act, 21 U.S.C.section 360bbb-3(b)(1), unless the authorization is terminated  or revoked sooner.       Influenza A by PCR NEGATIVE NEGATIVE Final   Influenza B by PCR NEGATIVE NEGATIVE Final    Comment: (NOTE) The Xpert Xpress SARS-CoV-2/FLU/RSV plus assay is intended as an aid in the diagnosis of  influenza from Nasopharyngeal swab specimens and should not be used as a sole basis for treatment. Nasal washings and aspirates are unacceptable for Xpert Xpress SARS-CoV-2/FLU/RSV testing.  Fact Sheet for Patients: EntrepreneurPulse.com.au  Fact Sheet for Healthcare Providers: IncredibleEmployment.be  This test is not yet approved or cleared by the [place] FDA and has been authorized for detection and/or diagnosis of SARS-CoV-2 by FDA under an Emergency Use Authorization (EUA). This EUA will remain in effect (meaning this test can be used) for the duration of the COVID-19 declaration under Section 564(b)(1) of the Act, 21 U.S.C. section 360bbb-3(b)(1), unless the authorization is terminated or revoked.     Resp Syncytial Virus by PCR NEGATIVE NEGATIVE Final    Comment: (NOTE) Fact Sheet for Patients: EntrepreneurPulse.com.au  Fact Sheet for Healthcare Providers: IncredibleEmployment.be  This test is not yet approved or cleared by the [place] FDA and has been authorized for detection and/or diagnosis of SARS-CoV-2 by FDA under an Emergency Use Authorization (EUA). This EUA will remain in effect (  meaning this test can be used) for the duration of the COVID-19 declaration under Section 564(b)(1) of the Act, 21 U.S.C. section 360bbb-3(b)(1), unless the authorization is terminated or revoked.  Performed at [facility], [address] [place], [place] [address]          Radiology Studies: DG Chest Portable 1 View  Result Date: 01/11/2023 CLINICAL DATA:  Short of breath EXAM: PORTABLE CHEST 1 VIEW COMPARISON:  11/28/2022, 11/26/2022, 11/04/2022 FINDINGS: Borderline cardiomegaly with slight central congestion. No acute airspace disease or pleural effusion. No pneumothorax. Central airways thickening is suspected. IMPRESSION: Borderline cardiomegaly with slight central vascular congestion.  Suspect central airways thickening but no acute confluent airspace disease Electronically Signed   By: Donavan Foil M.D.   On: 01/11/2023 23:01        Scheduled Meds:  aspirin EC  81 mg Oral Daily   atorvastatin  80 mg Oral Daily   Chlorhexidine Gluconate Cloth  6 each Topical Q0600   clopidogrel  75 mg Oral Daily   darbepoetin (ARANESP) injection - DIALYSIS  200 mcg Subcutaneous Q Mon-1800   ezetimibe  10 mg Oral QPM   heparin  5,000 Units Subcutaneous Q8H   insulin aspart  0-5 Units Subcutaneous QHS   insulin aspart  0-6 Units Subcutaneous TID WC   sevelamer carbonate  2,400 mg Oral TID WC   sodium zirconium cyclosilicate  10 g Oral TID   Continuous Infusions:   LOS: 0 days    Time spent: 52 minutes spent on chart review, discussion with nursing staff, consultants, updating family and interview/physical exam; more than 50% of that time was spent in counseling and/or coordination of care.    Exzavier Ruderman J [place] DO [facility] Available via Epic secure chat 7am-7pm After these hours, please refer to coverage provider listed on [email or web address] 01/12/2023, 9:54 AM

## 2023-01-12 NOTE — ED Notes (Signed)
Left side restricted due to dialysis. Tu/Th/Sat. Has not missed any apts.

## 2023-01-12 NOTE — H&P (Signed)
History and Physical  Kelly Key [id number]:[id number] DOB: 04/01/1971 DOA: 01/11/2023  Referring physician: Dr. Karle Starch, [place]  PCP: [name], MD  Outpatient Specialists: Nephrology, cardiology. Patient coming from: Home  Chief Complaint: Shortness of breath, trouble lying down flat.  HPI: Kelly Key is a 52 y.o. male with medical history significant for ESRD on HD TTS still making urine, HFpEF 55-60%, type 2 diabetes, diabetic polyneuropathy, hyperlipidemia, GERD, hypertension, who presented to [facility], ED with complaints of progressive shortness of breath with onset 2 days ago.  Worse with laying down flat.  Associated with orthopnea.  Endorses subjective fevers and chills today.  Denies missing his hemodialysis sessions.  Last hemodialysis was on Saturday, 01/10/2023.  Admits to eating a non renal-dialysis diet for the past day.  States he was taken off his home diuretics, was told that he had OSA but has never had a sleep study.  Requested to be tested for OSA by his primary care provider, it has not been done yet.  Denies chest pain or palpitations.    Upon presentation to the ED, the patient is volume overload on exam with peripheral edema 1+ pitting edema in lower extremities bilaterally.  Lab studies notable for moderate hyperkalemia with serum potassium 6.3.  Received 1 dose of Lokelma in the ED 10 g x 1.  EDP requested admission for further management.  The patient was admitted by [facility], hospitalist service.  ED Course: Tmax 98.1.  BP 115/68, pulse 85, respiratory 16, O2 saturation 95% on room air.  Lab studies remarkable for hemoglobin 8.6 from 9.2.  Serum sodium 130, potassium 6.3, serum glucose 151, BUN 51, creatinine 8.66, GFR 7.  Troponin 25, repeat 29.  Review of Systems: Review of systems as noted in the HPI. All other systems reviewed and are negative.   Past Medical History:  Diagnosis Date   Acute diastolic CHF (congestive heart failure) ([place]) 06/11/2012   EF 50-55% [facility])   Chronic kidney disease    COPD (chronic obstructive pulmonary disease) (HCC)    Diabetes mellitus    A1c 11.5 (06/11/2012).   Dyspnea    Gout    Hepatic steatosis 06/11/2012   Elevated LFTs   Hyperlipemia    Malignant hypertension    Microcytic anemia 06/12/2012   Obesity    Past Surgical History:  Procedure Laterality Date   ABDOMINAL AORTOGRAM W/LOWER EXTREMITY N/A 12/01/2022   Procedure: ABDOMINAL AORTOGRAM W/LOWER EXTREMITY;  Surgeon: Marty Heck, MD;  Location: [place] [facility];  Service: Cardiovascular;  Laterality: N/A;   AMPUTATION Left 12/03/2022   Procedure: LEFT SECOND TOE AMPUTATION;  Surgeon: Newt Minion, MD;  Location: [place];  Service: Orthopedics;  Laterality: Left;   AV FISTULA PLACEMENT Left 11/11/2019   Procedure: ARTERIOVENOUS (AV) FISTULA CREATION LEFT BRACHIOCEPHALIC ARM;  Surgeon: Serafina Mitchell, MD;  Location: [place];  Service: Vascular;  Laterality: Left;   COLONOSCOPY WITH PROPOFOL N/A 09/02/2021   Procedure: COLONOSCOPY WITH PROPOFOL;  Surgeon: Jonathon Bellows, MD;  Location: [facility];  Service: Gastroenterology;  Laterality: N/A;   IR FLUORO GUIDE CV LINE RIGHT  07/08/2019   IR REMOVAL TUN CV CATH W/O FL  03/23/2020   IR US GUIDE VASC ACCESS RIGHT  07/08/2019   LEFT HEART CATH AND CORONARY ANGIOGRAPHY N/A 06/12/2021   Procedure: LEFT HEART CATH AND CORONARY ANGIOGRAPHY;  Surgeon: Belva Crome, MD;  Location: [place] [facility];  Service: Cardiovascular;  Laterality: N/A;   NO PAST SURGERIES  PERIPHERAL VASCULAR BALLOON ANGIOPLASTY Left 12/01/2022   Procedure: PERIPHERAL VASCULAR BALLOON ANGIOPLASTY;  Surgeon: Marty Heck, MD;  Location: [place] [facility];  Service: Cardiovascular;  Laterality: Left;  Anterior tibial   PERIPHERAL VASCULAR INTERVENTION Left 12/01/2022   Procedure: PERIPHERAL VASCULAR INTERVENTION;  Surgeon: Marty Heck, MD;  Location: [place] [facility];  Service: Cardiovascular;  Laterality: Left;  sfa    VASCULAR SURGERY      Social History:  reports that he has never smoked. He has been exposed to tobacco smoke. He has never used smokeless tobacco. He reports that he does not currently use alcohol. He reports that he does not use drugs.   Allergies  Allergen Reactions   Ozempic (0.25 Or 0.5 Mg-Dose) [Semaglutide(0.25 Or 0.5mg -Dos)] Shortness Of Breath   Hydralazine Anxiety    Hallucinations    Lisinopril Swelling    Family History  Problem Relation Age of Onset   Gout Mother    Asthma Mother    Diabetes Father    Heart failure Father    Diabetes Sister    Hypertension Brother    Pancreatic cancer Brother    Diabetes Sister       Prior to Admission medications   Medication Sig Start Date End Date Taking? Authorizing Provider  acetaminophen (TYLENOL) 500 MG tablet Take 1,000 mg by mouth every 6 (six) hours as needed for moderate pain or headache.    [provider]  albuterol (VENTOLIN HFA) 108 (90 Base) MCG/ACT inhaler Inhale 2 puffs into the lungs every 4 (four) hours as needed for wheezing or shortness of breath.  03/24/19   [provider]  allopurinol (ZYLOPRIM) 100 MG tablet Take 2 tablets (200 mg total) by mouth daily. 08/04/19   Love, Ivan Anchors, PA-C  AMBULATORY NON FORMULARY MEDICATION Inject 0.2 ml by intracavernosal route as directed.  Medication Name: TriMix PGE 10 mcg Pap   30 mg Phent 1 mg 01/01/22   Stoneking, Reece Leader., MD  amLODipine (NORVASC) 5 MG tablet Take 5 mg by mouth daily. 01/30/22   [provider]  ammonium lactate (AMLACTIN) 12 % cream Apply 1 Application topically in the morning and at bedtime. 09/08/22   [provider]  aspirin EC 81 MG EC tablet Take 1 tablet (81 mg total) by mouth daily. 08/04/19   Love, Ivan Anchors, PA-C  atorvastatin (LIPITOR) 80 MG tablet Take 1 tablet (80 mg total) by mouth daily. 12/05/19   Arnoldo Lenis, MD  Calcium Carbonate Antacid (CALCIUM CARBONATE, DOSED IN MG ELEMENTAL CALCIUM,) 1250  MG/5ML SUSP Take 5 mLs (500 mg of elemental calcium total) by mouth every 6 (six) hours as needed for indigestion. 07/16/19   Amin, Jeanella Flattery, MD  carvedilol (COREG) 25 MG tablet Take 1 tablet (25 mg total) by mouth 2 (two) times daily with a meal. 08/03/19   Love, Ivan Anchors, PA-C  cloNIDine (CATAPRES) 0.3 MG tablet Take 0.3 mg by mouth 3 (three) times daily.    [provider]  clopidogrel (PLAVIX) 75 MG tablet Take 1 tablet (75 mg total) by mouth daily. 12/06/22 02/04/23  Elodia Florence., MD  esomeprazole (NEXIUM) 20 MG capsule Take 20 mg by mouth daily. 01/23/22   [provider]  ezetimibe (ZETIA) 10 MG tablet Take 10 mg by mouth every evening. 01/20/19   [provider]  fluticasone (FLONASE) 50 MCG/ACT nasal spray Place 1 spray into both nostrils daily as needed for allergies or rhinitis.    [provider]  gabapentin (NEURONTIN) 100 MG capsule Take 1 capsule (100 mg total) by mouth 3 (three) times daily. 08/03/19   Love, Ivan Anchors, PA-C  glucose blood (ACCU-CHEK AVIVA PLUS) test strip Use as instructed TO CHECK BLOOD GLUCOSE THREE TIMES DAILY 08/15/20   Nida, Marella Chimes, MD  guaiFENesin-dextromethorphan (ROBITUSSIN DM) 100-10 MG/5ML syrup Take 10 mLs by mouth every 8 (eight) hours. 11/05/22   Manuella Ghazi, Pratik D, DO  HUMALOG KWIKPEN 100 UNIT/ML KwikPen Inject 8-14 Units into the skin 3 (three) times daily. 08/19/22   [provider]  hydrOXYzine (ATARAX) 25 MG tablet Take 25 mg by mouth 3 (three) times daily as needed for anxiety. 11/28/21   [provider]  Insulin Pen Needle (B-D ULTRAFINE III SHORT PEN) 31G X 8 MM MISC 1 each by Does not apply route as directed. 08/13/20   Cassandria Anger, MD  Ipratropium-Albuterol (COMBIVENT) 20-100 MCG/ACT AERS respimat Inhale 1 puff into the lungs every 6 (six) hours as needed for wheezing or shortness of breath. 11/05/22 12/05/22  Manuella Ghazi, Pratik D, DO  LEVEMIR FLEXTOUCH 100 UNIT/ML FlexTouch Pen INJECT  50 UNITS SUBCUTANEOUSLY AT BEDTIME Patient taking differently: Inject 50 Units into the skin at bedtime. 07/09/21   Brita Romp, NP  linaclotide Rolan Lipa) 145 MCG CAPS capsule Take 1 capsule (145 mcg total) by mouth daily before breakfast. Patient not taking: Reported on 11/04/2022 04/05/18   Heath Lark D, DO  multivitamin (RENA-VIT) TABS tablet Take 1 tablet by mouth at bedtime. 08/03/19   Love, Ivan Anchors, PA-C  mupirocin ointment (BACTROBAN) 2 % Apply 1 Application topically 2 (two) times daily.    [provider]  nitroGLYCERIN (NITRODUR - DOSED IN MG/24 HR) 0.2 mg/hr patch Place 1 patch (0.2 mg total) onto the skin daily. 12/31/22   Suzan Slick, NP  OZEMPIC, 1 MG/DOSE, 4 MG/3ML SOPN Inject 1 mg into the skin once a week. Fridays.    [provider]  sevelamer carbonate (RENVELA) 800 MG tablet Take 3 tablets (2,400 mg total) by mouth 3 (three) times daily with meals. 08/03/19   Love, Ivan Anchors, PA-C  sulfamethoxazole-trimethoprim (BACTRIM DS) 800-160 MG tablet Take 1 tablet by mouth 2 (two) times daily. 12/31/22   Suzan Slick, NP  vardenafil (LEVITRA) 20 MG tablet Take 1 tablet (20 mg total) by mouth daily as needed for erectile dysfunction. 08/16/21   Primus Bravo., MD    Physical Exam: BP (!) 155/83   Pulse 91   Temp 98.1 F (36.7 C) (Oral)   Resp 19   Ht 5\' 11"  (1.803 m)   Wt (!) 142.9 kg   SpO2 100%   BMI 43.93 kg/m   General: 52 y.o. year-old male well developed well nourished in no acute distress.  Alert and oriented x3. Cardiovascular: Regular rate and rhythm with no rubs or gallops.  No thyromegaly or JVD noted.  1+ pitting edema in lower extremities bilaterally.  Respiratory: Faint rales at bases.  No wheezing noted. Good inspiratory effort. Abdomen: Soft nontender nondistended with normal bowel sounds x4 quadrants. Muskuloskeletal: No cyanosis or clubbing noted bilaterally Neuro: CN II-XII intact, strength, sensation, reflexes Skin: No  ulcerative lesions noted or rashes Psychiatry: Judgement and insight appear normal. Mood is appropriate for condition and setting          Labs on Admission:  Basic Metabolic Panel: Recent Labs  Lab 01/11/23 2342  NA 130*  K 6.3*  CL 93*  CO2 26  GLUCOSE 151*  BUN 51*  CREATININE 8.66*  CALCIUM 8.4*   Liver Function Tests: No results for input(s): "AST", "ALT", "ALKPHOS", "BILITOT", "PROT", "ALBUMIN" in the last 168 hours. No results for input(s): "LIPASE", "AMYLASE" in the last 168 hours. No results for input(s): "AMMONIA" in the last 168 hours. CBC: Recent Labs  Lab 01/11/23 2342  WBC 9.7  NEUTROABS 7.5  HGB 8.6*  HCT 28.0*  MCV 92.7  PLT 227   Cardiac Enzymes: No results for input(s): "CKTOTAL", "CKMB", "CKMBINDEX", "TROPONINI" in the last 168 hours.  BNP (last 3 results) Recent Labs    07/28/22 0234 11/04/22 0859 11/26/22 1851  BNP 192.0* 596.0* 510.0*    ProBNP (last 3 results) No results for input(s): "PROBNP" in the last 8760 hours.  CBG: No results for input(s): "GLUCAP" in the last 168 hours.  Radiological Exams on Admission: DG Chest Portable 1 View  Result Date: 01/11/2023 CLINICAL DATA:  Short of breath EXAM: PORTABLE CHEST 1 VIEW COMPARISON:  11/28/2022, 11/26/2022, 11/04/2022 FINDINGS: Borderline cardiomegaly with slight central congestion. No acute airspace disease or pleural effusion. No pneumothorax. Central airways thickening is suspected. IMPRESSION: Borderline cardiomegaly with slight central vascular congestion. Suspect central airways thickening but no acute confluent airspace disease Electronically Signed   By: Donavan Foil M.D.   On: 01/11/2023 23:01    EKG: I independently viewed the EKG done and my findings are as followed: Sinus rhythm rate of 93.  Nonspecific ST-T changes.  QTc 456.  Assessment/Plan Present on Admission:  Volume overload  Principal Problem:   Volume overload  Volume overload with concern for acute on  chronic diastolic CHF in the setting of ESRD with HD TTS. Last hemodialysis was on Saturday, 01/10/2023. Presents with dyspnea, orthopnea, peripheral edema. Obtain BNP IV Lasix 80 mg x 1 Nephrology consulted for possible hemodialysis on 01/12/2023. Follow 2D echo Start strict I's and O's and daily weight  Elevated troponin, suspect demand ischemia Denies chest pain First set of troponin 25, repeat 29 No evidence of acute ischemia on twelve-lead EKG Follow 2D echo.  ESRD on HD TTS Nephrology consulted to resume hemodialysis while inpatient Will benefit from hemodialysis on 01/12/2023 Defer to nephrology Volume status and electrolytes managed with hemodialysis.  Acute on chronic diastolic CHF Last 2D echo done on 05/15/2021 revealed LVEF 55 to 60% Due to concern for acute on chronic diastolic CHF, 2D echo was ordered. IV Lasix 80 mg x 1 given in the ED Resume home cardiac medications Start strict I's and O's and daily weight  Moderate hyperkalemia in the setting of ESRD Presented with serum potassium 6.3, no peaked T waves on twelve-lead EKG.  Did not receive calcium gluconate. Treated with Lokelma 10 g x 1 in the ED Added Lokelma 10 g 3 times daily x 3 doses Also added IV Lasix 80 mg x 1 in the ED Repeat renal panel in the morning. Closely monitor on telemetry.  Type 2 diabetes with hyperglycemia Last hemoglobin A1c 7.4 on 01/12/2023 Start insulin sliding scale Avoid hypoglycemia.  Hypertension BPs are soft Hold off home oral hypotensives Closely monitor vital signs Maintain MAP greater than 65.  Hypervolemic hyponatremia Suspect hyponatremia secondary to volume overload. Presented with serum sodium 130 Electrolytes and volume status managed with hemodialysis  Severe morbid obesity BMI 43 Recommend weight loss outpatient with regular physical activity and healthy dieting.  Possible OSA Will need to follow-up with pulmonary outpatient for sleep study Obtain VBG to  assess pCO2 and pH  Peripheral artery disease History  of osteomyelitis of second toe of left foot status post, recent left toe amputation status post revascularization by vascular surgery As needed analgesics Management per orthopedic surgery and vascular surgery outpatient. Plan to return to orthopedic surgery on 01/15/2023. Per vascular surgery recommendation continue aspirin, Plavix and statin. Recently on Bactrim 1 tablet twice daily, starting on 12/31/2022 x 10 days      DVT prophylaxis: Subcu heparin 3 times daily  Code Status: Full code as stated by the patient himself.  Family Communication: None at bedside.  Disposition Plan: Admitted to telemetry unit.  Consults called: Nephrology consulted.  Admission status: Inpatient status.   Status is: Inpatient The patient requires at least 2 midnights for further evaluation and treatment of present condition.   Kayleen Memos MD [facility] Pager [phone]  If 7PM-7AM, please contact night-coverage [email or web address] Password [email or web address]  01/12/2023, 2:50 AM

## 2023-01-12 NOTE — Consult Note (Signed)
[facility] Nurse Consult Note: Reason for Consult: wound care for amputation left toe Followed by orthopedics s/p amputation 12/01/22; VVS as well s/p re vascularization  Patient has dry stable area on the right great toe Wound type: surgical  Pressure Injury POA: NA Measurement: see nursing flow sheets Wound bed: pale pink surgical site, mildly dehisced at 3/12 visit with VVS; right great toe is dry Drainage (amount, consistency, odor) none Periwound: intact Dressing procedure/placement/frequency: Continue dry dressings daily for both right and left foot wounds.  Offload surgical site with post op shoe as ordered per Dr. Sharol Given  Follow up with Dr. Sharol Given and VVS as scheduled   Re consult if needed, will not follow at this time. Thanks  Jaiceon Collister [employer], RN,CWOCN, CNS, [place] [phone])

## 2023-01-12 NOTE — TOC Initial Note (Signed)
Transition of Care [facility]) - Initial/Assessment Note    Patient Details  Name: Kelly Key MRN: [id number] Date of Birth: [birth year]  Transition of [facility]) CM/SW Contact:    Shade Flood, LCSW Phone Number: 01/12/2023, 1:19 PM  Clinical Narrative:                  Pt admitted from home. He is considered high risk for readmission. Met with pt today at bedside in HD to assess.  Pt resides with his wife. He is an outpatient HD client. Pt states he is able to get to appointments and obtain medications as needed. Pt currently active with [facility] for RN and PT. He would like to resume these services upon dc.  Pt is not anticipating any other TOC needs for dc. Will follow.  Expected Discharge Plan: [place] Barriers to Discharge: Continued Medical Work up   Patient Goals and CMS Choice Patient states their goals for this hospitalization and ongoing recovery are:: go home CMS Medicare.gov Compare Post Acute Care list provided to:: Patient Choice offered to / list presented to : Patient      Expected Discharge Plan and Services In-house Referral: Clinical Social Work   Post Acute Care Choice: Resumption of Svcs/PTA Provider Living arrangements for the past 2 months: Single Family Home                                      Prior Living Arrangements/Services Living arrangements for the past 2 months: Single Family Home Lives with:: Spouse Patient language and need for interpreter reviewed:: Yes Do you feel safe going back to the place where you live?: Yes      Need for Family Participation in Patient Care: Yes (Comment) Care giver support system in place?: Yes (comment) Current home services: DME, Home PT, Home RN Criminal Activity/Legal Involvement Pertinent to Current Situation/Hospitalization: No - Comment as needed  Activities of Daily Living Home Assistive Devices/Equipment: Wheelchair, Environmental consultant (specify type), Cane (specify quad or  straight), Dentures (specify type), Shower chair with back ADL Screening (condition at time of admission) Patient's cognitive ability adequate to safely complete daily activities?: Yes Is the patient deaf or have difficulty hearing?: No Does the patient have difficulty seeing, even when wearing glasses/contacts?: No Does the patient have difficulty concentrating, remembering, or making decisions?: No Patient able to express need for assistance with ADLs?: Yes Does the patient have difficulty dressing or bathing?: No Independently performs ADLs?: Yes (appropriate for developmental age) Does the patient have difficulty walking or climbing stairs?: Yes Weakness of Legs: Both Weakness of Arms/Hands: None  Permission Sought/Granted                  Emotional Assessment Appearance:: Appears stated age Attitude/Demeanor/Rapport: Engaged Affect (typically observed): Pleasant Orientation: : Oriented to Self, Oriented to Place, Oriented to  Time, Oriented to Situation Alcohol / Substance Use: Not Applicable Psych Involvement: No (comment)  Admission diagnosis:  Hyperkalemia [E87.5] SOB (shortness of breath) [R06.02] Volume overload [E87.70] ESRD on hemodialysis ([place]) [N18.6, Z99.2] Patient Active Problem List   Diagnosis Date Noted   PAD (peripheral artery disease) ([place]) 12/01/2022   Gangrene of toe of left foot ([place]) 11/28/2022   Diabetes mellitus type 2 in obese ([place]) 11/27/2022   GERD (gastroesophageal reflux disease) 11/27/2022   Flash pulmonary edema ([place]) 11/04/2022   Dysphagia 10/18/2022   Osteomyelitis of  second toe of left foot ([place]) 11/13/2021   OSA (obstructive sleep apnea) 10/04/2021   Obesity hypoventilation syndrome ([place]) 10/04/2021   Organic impotence 08/16/2021   NSTEMI (non-ST elevated myocardial infarction) ([place]) 05/17/2021   Acute gout of right knee 08/03/2019   Orthostasis    Pain    Physical debility 07/16/2019   ESRD (end stage renal disease) ([place])     Anemia of chronic disease    Uncontrolled type 2 diabetes mellitus with hyperglycemia (HCC)    Volume overload 07/05/2019   Anasarca 07/03/2019   Dyslipidemia 07/03/2019   Chronic combined systolic and diastolic CHF (congestive heart failure) ([place]) 07/03/2019   Bilateral leg edema    Hypokalemia    Thromboembolism ([place]) 02/20/2019   Diabetes mellitus type 2 in nonobese ([place]) 06/12/2012   Hyponatremia 06/12/2012   Microcytic anemia 06/12/2012   Essential hypertension, benign 06/11/2012   Elevated LFTs 06/11/2012   Morbid obesity ([place]) 06/11/2012   Hepatic steatosis 06/11/2012   PCP:  [name], MD Pharmacy:   [facility] [address], [place] [address] Phone: [phone] Fax: [place], [place] [id number] [place] [id number] Phone: [phone] Fax: [phone]     Social Determinants of Health (SDOH) Social History: SDOH Screenings   Food Insecurity: No Food Insecurity (01/12/2023)  Housing: Low Risk  (01/12/2023)  Transportation Needs: No Transportation Needs (01/12/2023)  Utilities: Not At Risk (01/12/2023)  Depression (PHQ2-9): Low Risk  (12/25/2021)  Financial Resource Strain: Low Risk  (02/20/2019)  Physical Activity: Unknown (02/20/2019)  Social Connections: Unknown (02/20/2019)  Stress: No Stress Concern Present (02/20/2019)  Tobacco Use: Low Risk  (01/12/2023)   SDOH Interventions:     Readmission Risk Interventions    01/12/2023    1:18 PM 12/02/2022    4:02 PM 11/28/2022    1:43 PM  Readmission Risk Prevention Plan  Transportation Screening  Complete Complete  Medication Review (RN Care Manager) Complete Complete Complete  HRI or Home Care Consult Complete Complete Complete  SW Recovery Care/Counseling Consult Complete Complete Complete  Palliative Care Screening Not Applicable Not Applicable Not Applicable  Skilled Nursing Facility Not Applicable Not Applicable Not  Applicable

## 2023-01-12 NOTE — Consult Note (Signed)
[facility] Renal Consultation Note    Indication for Consultation:  Management of ESRD/hemodialysis; anemia, hypertension/volume and secondary hyperparathyroidism  HPI: Kelly Key is a 52 y.o. male  with a history of ESRD, chronic diastolic CHF, COPD, DM, gout, HTN, and obesity who presented to the [facility] ED with a 2 day history of shortness of breath and orthopnea. He also hc/o fevers and chills.  He went to HD on 01/10/23 but they couldn't get all of his fluid off per his report but doesn't know how much he had left.  In the ED, he was noted to have volume overload on exam.  Labs were notable for K of 6.3 and given Lokelma.  CXR with borderline cardiomegaly and slight central vascular congestion.  We were consulted for possible dialysis and UF.  Past Medical History:  Diagnosis Date   Acute diastolic CHF (congestive heart failure) ([place]) 06/11/2012   EF 50-55% [facility])   Chronic kidney disease    COPD (chronic obstructive pulmonary disease) (HCC)    Diabetes mellitus    A1c 11.5 (06/11/2012).   Dyspnea    Gout    Hepatic steatosis 06/11/2012   Elevated LFTs   Hyperlipemia    Malignant hypertension    Microcytic anemia 06/12/2012   Obesity    Past Surgical History:  Procedure Laterality Date   ABDOMINAL AORTOGRAM W/LOWER EXTREMITY N/A 12/01/2022   Procedure: ABDOMINAL AORTOGRAM W/LOWER EXTREMITY;  Surgeon: Marty Heck, MD;  Location: [place] [facility];  Service: Cardiovascular;  Laterality: N/A;   AMPUTATION Left 12/03/2022   Procedure: LEFT SECOND TOE AMPUTATION;  Surgeon: Newt Minion, MD;  Location: [place];  Service: Orthopedics;  Laterality: Left;   AV FISTULA PLACEMENT Left 11/11/2019   Procedure: ARTERIOVENOUS (AV) FISTULA CREATION LEFT BRACHIOCEPHALIC ARM;  Surgeon: Serafina Mitchell, MD;  Location: [place];  Service: Vascular;  Laterality: Left;   COLONOSCOPY WITH PROPOFOL N/A 09/02/2021   Procedure: COLONOSCOPY WITH PROPOFOL;  Surgeon: Jonathon Bellows, MD;   Location: [facility] ENDOSCOPY;  Service: Gastroenterology;  Laterality: N/A;   IR FLUORO GUIDE CV LINE RIGHT  07/08/2019   IR REMOVAL TUN CV CATH W/O FL  03/23/2020   IR US GUIDE VASC ACCESS RIGHT  07/08/2019   LEFT HEART CATH AND CORONARY ANGIOGRAPHY N/A 06/12/2021   Procedure: LEFT HEART CATH AND CORONARY ANGIOGRAPHY;  Surgeon: Belva Crome, MD;  Location: [place] [facility];  Service: Cardiovascular;  Laterality: N/A;   NO PAST SURGERIES     PERIPHERAL VASCULAR BALLOON ANGIOPLASTY Left 12/01/2022   Procedure: PERIPHERAL VASCULAR BALLOON ANGIOPLASTY;  Surgeon: Marty Heck, MD;  Location: [place] [facility];  Service: Cardiovascular;  Laterality: Left;  Anterior tibial   PERIPHERAL VASCULAR INTERVENTION Left 12/01/2022   Procedure: PERIPHERAL VASCULAR INTERVENTION;  Surgeon: Marty Heck, MD;  Location: [place] [facility];  Service: Cardiovascular;  Laterality: Left;  sfa   VASCULAR SURGERY     Family History:   Family History  Problem Relation Age of Onset   Gout Mother    Asthma Mother    Diabetes Father    Heart failure Father    Diabetes Sister    Hypertension Brother    Pancreatic cancer Brother    Diabetes Sister    Social History:  reports that he has never smoked. He has been exposed to tobacco smoke. He has never used smokeless tobacco. He reports that he does not currently use alcohol. He reports that he does not use  drugs. Allergies  Allergen Reactions   Ozempic (0.25 Or 0.5 Mg-Dose) [Semaglutide(0.25 Or 0.5mg -Dos)] Shortness Of Breath   Hydralazine Anxiety    Hallucinations    Lisinopril Swelling   Prior to Admission medications   Medication Sig Start Date End Date Taking? Authorizing Provider  acetaminophen (TYLENOL) 500 MG tablet Take 1,000 mg by mouth every 6 (six) hours as needed for moderate pain or headache.   Yes [provider]  albuterol (VENTOLIN HFA) 108 (90 Base) MCG/ACT inhaler Inhale 2 puffs into the lungs every 4 (four) hours as needed  for wheezing or shortness of breath.  03/24/19  Yes [provider]  allopurinol (ZYLOPRIM) 100 MG tablet Take 2 tablets (200 mg total) by mouth daily. 08/04/19  Yes Love, Ivan Anchors, PA-C  amLODipine (NORVASC) 5 MG tablet Take 5 mg by mouth daily. 01/30/22  Yes [provider]  aspirin EC 81 MG EC tablet Take 1 tablet (81 mg total) by mouth daily. 08/04/19  Yes Love, Ivan Anchors, PA-C  atorvastatin (LIPITOR) 80 MG tablet Take 1 tablet (80 mg total) by mouth daily. 12/05/19  Yes Branch, Alphonse Guild, MD  carvedilol (COREG) 25 MG tablet Take 1 tablet (25 mg total) by mouth 2 (two) times daily with a meal. 08/03/19  Yes Love, Ivan Anchors, PA-C  cloNIDine (CATAPRES) 0.3 MG tablet Take 0.3 mg by mouth 3 (three) times daily.   Yes [provider]  clopidogrel (PLAVIX) 75 MG tablet Take 1 tablet (75 mg total) by mouth daily. 12/06/22 02/04/23 Yes Elodia Florence., MD  esomeprazole (NEXIUM) 20 MG capsule Take 20 mg by mouth daily. 01/23/22  Yes [provider]  ezetimibe (ZETIA) 10 MG tablet Take 10 mg by mouth every evening. 01/20/19  Yes [provider]  fluticasone (FLONASE) 50 MCG/ACT nasal spray Place 1 spray into both nostrils daily as needed for allergies or rhinitis.   Yes [provider]  gabapentin (NEURONTIN) 100 MG capsule Take 1 capsule (100 mg total) by mouth 3 (three) times daily. 08/03/19  Yes Love, Ivan Anchors, PA-C  HUMALOG KWIKPEN 100 UNIT/ML KwikPen Inject 8-14 Units into the skin 3 (three) times daily. 08/19/22  Yes [provider]  Ipratropium-Albuterol (COMBIVENT) 20-100 MCG/ACT AERS respimat Inhale 1 puff into the lungs every 6 (six) hours as needed for wheezing or shortness of breath. 11/05/22 01/12/23 Yes Shah, Pratik D, DO  LEVEMIR FLEXTOUCH 100 UNIT/ML FlexTouch Pen INJECT 50 UNITS SUBCUTANEOUSLY AT BEDTIME Patient taking differently: Inject 50 Units into the skin at bedtime. 07/09/21  Yes Brita Romp, NP  multivitamin (RENA-VIT)  TABS tablet Take 1 tablet by mouth at bedtime. 08/03/19  Yes Love, Ivan Anchors, PA-C  OZEMPIC, 1 MG/DOSE, 4 MG/3ML SOPN Inject 1 mg into the skin once a week. Fridays.   Yes [provider]  sevelamer carbonate (RENVELA) 800 MG tablet Take 3 tablets (2,400 mg total) by mouth 3 (three) times daily with meals. 08/03/19  Yes Love, Ivan Anchors, PA-C  AMBULATORY NON FORMULARY MEDICATION Inject 0.2 ml by intracavernosal route as directed.  Medication Name: TriMix PGE 10 mcg Pap   30 mg Phent 1 mg 01/01/22   Stoneking, Reece Leader., MD  ammonium lactate (AMLACTIN) 12 % cream Apply 1 Application topically in the morning and at bedtime. Patient not taking: Reported on 01/12/2023 09/08/22   [provider]  Calcium Carbonate Antacid (CALCIUM CARBONATE, DOSED IN MG ELEMENTAL CALCIUM,) 1250 MG/5ML SUSP Take 5 mLs (500 mg of elemental calcium total) by  mouth every 6 (six) hours as needed for indigestion. Patient not taking: Reported on 01/12/2023 07/16/19   Damita Lack, MD  glucose blood (ACCU-CHEK AVIVA PLUS) test strip Use as instructed TO CHECK BLOOD GLUCOSE THREE TIMES DAILY 08/15/20   Nida, Marella Chimes, MD  guaiFENesin-dextromethorphan (ROBITUSSIN DM) 100-10 MG/5ML syrup Take 10 mLs by mouth every 8 (eight) hours. Patient not taking: Reported on 01/12/2023 11/05/22   Heath Lark D, DO  hydrOXYzine (ATARAX) 25 MG tablet Take 25 mg by mouth 3 (three) times daily as needed for anxiety. Patient not taking: Reported on 01/12/2023 11/28/21   [provider]  Insulin Pen Needle (B-D ULTRAFINE III SHORT PEN) 31G X 8 MM MISC 1 each by Does not apply route as directed. 08/13/20   Cassandria Anger, MD  nitroGLYCERIN (NITRODUR - DOSED IN MG/24 HR) 0.2 mg/hr patch Place 1 patch (0.2 mg total) onto the skin daily. Patient not taking: Reported on 01/12/2023 12/31/22   Suzan Slick, NP  sulfamethoxazole-trimethoprim (BACTRIM DS) 800-160 MG tablet Take 1 tablet by mouth 2 (two) times  daily. Patient not taking: Reported on 01/12/2023 12/31/22   Suzan Slick, NP  vardenafil (LEVITRA) 20 MG tablet Take 1 tablet (20 mg total) by mouth daily as needed for erectile dysfunction. Patient not taking: Reported on 01/12/2023 08/16/21   Primus Bravo., MD   Current Facility-Administered Medications  Medication Dose Route Frequency Provider Last Rate Last Admin   acetaminophen (TYLENOL) tablet 650 mg  650 mg Oral Q6H PRN Irene Pap N, DO       albuterol (VENTOLIN HFA) 108 (90 Base) MCG/ACT inhaler 2 puff  2 puff Inhalation Q4H PRN Kayleen Memos, DO       aspirin EC tablet 81 mg  81 mg Oral Daily Hall, Carole N, DO       atorvastatin (LIPITOR) tablet 80 mg  80 mg Oral Daily Hall, Carole N, DO       clopidogrel (PLAVIX) tablet 75 mg  75 mg Oral Daily Hall, Carole N, DO       ezetimibe (ZETIA) tablet 10 mg  10 mg Oral QPM Hall, Carole N, DO       heparin injection 5,000 Units  5,000 Units Subcutaneous Q8H Kayleen Memos, DO   5,000 Units at 01/12/23 [id number]   hydrOXYzine (ATARAX) tablet 25 mg  25 mg Oral TID PRN Kayleen Memos, DO       insulin aspart (novoLOG) injection 0-5 Units  0-5 Units Subcutaneous QHS Hall, Carole N, DO       insulin aspart (novoLOG) injection 0-6 Units  0-6 Units Subcutaneous TID WC Hall, Carole N, DO       melatonin tablet 3 mg  3 mg Oral QHS PRN Irene Pap N, DO       polyethylene glycol (MIRALAX / GLYCOLAX) packet 17 g  17 g Oral Daily PRN Irene Pap N, DO       prochlorperazine (COMPAZINE) injection 5 mg  5 mg Intravenous Q6H PRN Irene Pap N, DO       sevelamer carbonate (RENVELA) tablet 2,400 mg  2,400 mg Oral TID WC Hall, Carole N, DO       sodium zirconium cyclosilicate (LOKELMA) packet 10 g  10 g Oral TID Irene Pap N, DO   10 g at 01/12/23 0431   Labs: Basic Metabolic Panel: Recent Labs  Lab 01/11/23 2342 01/12/23 0456  NA 130* 132*  K 6.3* 4.8  CL 93* 94*  CO2 26 24  GLUCOSE 151* 176*  BUN 51* 54*  CREATININE 8.66* 9.34*   CALCIUM 8.4* 8.4*  PHOS  --  3.5   Liver Function Tests: Recent Labs  Lab 01/12/23 0456  AST 21  ALT 18  ALKPHOS 54  BILITOT 0.5  PROT 7.2  ALBUMIN 3.4*   No results for input(s): "LIPASE", "AMYLASE" in the last 168 hours. No results for input(s): "AMMONIA" in the last 168 hours. CBC: Recent Labs  Lab 01/11/23 2342 01/12/23 0456  WBC 9.7 7.5  NEUTROABS 7.5  --   HGB 8.6* 7.9*  HCT 28.0* 25.1*  MCV 92.7 93.0  PLT 227 202   Cardiac Enzymes: No results for input(s): "CKTOTAL", "CKMB", "CKMBINDEX", "TROPONINI" in the last 168 hours. CBG: Recent Labs  Lab 01/12/23 0743  GLUCAP 135*   Iron Studies: No results for input(s): "IRON", "TIBC", "TRANSFERRIN", "FERRITIN" in the last 72 hours. Studies/Results: DG Chest Portable 1 View  Result Date: 01/11/2023 CLINICAL DATA:  Short of breath EXAM: PORTABLE CHEST 1 VIEW COMPARISON:  11/28/2022, 11/26/2022, 11/04/2022 FINDINGS: Borderline cardiomegaly with slight central congestion. No acute airspace disease or pleural effusion. No pneumothorax. Central airways thickening is suspected. IMPRESSION: Borderline cardiomegaly with slight central vascular congestion. Suspect central airways thickening but no acute confluent airspace disease Electronically Signed   By: Donavan Foil M.D.   On: 01/11/2023 23:01    ROS: Pertinent items are noted in HPI. Physical Exam: Vitals:   01/12/23 0215 01/12/23 0303 01/12/23 0340 01/12/23 0805  BP:  115/68 121/73 137/69  Pulse: 91 85 86 98  Resp: 19 16 19 18   Temp:   98.6 F (37 C) 98.6 F (37 C)  TempSrc:   Oral Oral  SpO2: 100% 96% 96% 98%  Weight:   (!) 142.9 kg   Height:   5\' 6"  (1.676 m)       Weight change:   Intake/Output Summary (Last 24 hours) at 01/12/2023 0847 Last data filed at 01/12/2023 [id number] Gross per 24 hour  Intake 1040 ml  Output 100 ml  Net 940 ml   BP 137/69 (BP Location: Right Arm)   Pulse 98   Temp 98.6 F (37 C) (Oral)   Resp 18   Ht 5\' 6"  (1.676 m)   Wt (!)  142.9 kg   SpO2 98%   BMI 50.86 kg/m  General appearance: alert, cooperative, and no distress Head: Normocephalic, without obvious abnormality, atraumatic Resp: clear to auscultation bilaterally Cardio: regular rate and rhythm, S1, S2 normal, no murmur, click, rub or gallop GI: soft, non-tender; bowel sounds normal; no masses,  no organomegaly Extremities: edema trace pretibial edema, left foot wrapped and LUE AVF +T/B Dialysis Access:  Dialysis Orders: [facility] TTS 4:15hr, 450/500, EDW 138.5kg, 3K/2.5Ca, heparin 2600u loading + 1000u/hr - Mircera 30 mcg (last given on 11/25/22) - Venofer 50 mg weekly (last given on 11/25/22) - Calcitriol 2.5 mcg three days a week  Assessment/Plan:  Acute on chronic diastolic CHF - sounds like he has large idwg and not able to get to edw.  Will plan for an extra HD session with UF as tolerated.  ESRD -  as above, plan for HD today then again tomorrow to keep TTS schedule.  Hypertension/volume  - as above, volume overloaded and UF as able.   Anemia  - acute drop in Hgb.  Guaiac stool and transfuse as needed.  Plan for ESA and check iron stores  Metabolic bone disease -   continue with home  meds  Nutrition - renal diet, carb modified Hyperkalemia - improved with Lokelma.  Will need to change to 2K bath. Hyponatremia - due to CHF.  Will follow after HD and UF. S/p left second toe amputation - due to osteomyelitis.  Per primary svc.   Donetta Potts, MD [place] 01/12/2023, 8:47 AM

## 2023-01-12 NOTE — Evaluation (Signed)
Physical Therapy Evaluation Patient Details Name: Kelly Key MRN: [id number] DOB: 30-Jan-1971 Today's Date: 01/12/2023  History of Present Illness  Kelly Key is a 52 y.o. male with medical history significant for ESRD on HD TTS still making urine, HFpEF 55-60%, type 2 diabetes, diabetic polyneuropathy, hyperlipidemia, GERD, hypertension, who presented to [facility], ED with complaints of progressive shortness of breath with onset 2 days ago.  Worse with laying down flat.  Associated with orthopnea.  Endorses subjective fevers and chills today.  Denies missing his hemodialysis sessions.  Last hemodialysis was on Saturday, 01/10/2023.  Admits to eating a non renal-dialysis diet for the past day.  States he was taken off his home diuretics, was told that he had OSA but has never had a sleep study.  Requested to be tested for OSA by his primary care provider, it has not been done yet.  Denies chest pain or palpitations.   Clinical Impression  Patient demonstrates good return for bed mobility, transfers and walking in room without loss of balance.  Patient declined to ambulate in hallways due to not wanting to aggravate left foot and states his surgeon instructed him not to walk to much on right foot.  Plan:  Patient discharged from physical therapy to care of nursing for ambulation daily as tolerated for length of stay.        Recommendations for follow up therapy are one component of a multi-disciplinary discharge planning process, led by the attending physician.  Recommendations may be updated based on patient status, additional functional criteria and insurance authorization.  Follow Up Recommendations Home health PT      Assistance Recommended at Discharge PRN  Patient can return home with the following  Help with stairs or ramp for entrance;Assistance with cooking/housework;A little help with bathing/dressing/bathroom    Equipment Recommendations None recommended by PT  Recommendations for  Other Services       Functional Status Assessment Patient has had a recent decline in their functional status and demonstrates the ability to make significant improvements in function in a reasonable and predictable amount of time.     Precautions / Restrictions Precautions Precautions: None Restrictions Weight Bearing Restrictions: No      Mobility  Bed Mobility Overal bed mobility: Modified Independent                  Transfers Overall transfer level: Modified independent                      Ambulation/Gait Ambulation/Gait assistance: Modified independent (Device/Increase time) Gait Distance (Feet): 18 Feet Assistive device: None Gait Pattern/deviations: Decreased step length - right, Decreased step length - left, Decreased stance time - left, Decreased stride length, Antalgic Gait velocity: decreased     General Gait Details: good return for ambulating in room without loss of balance with slightly antalgic gait on left foot, declined to ambulate out side of room due to stating his surgeon instructed him not to walk to much on left foot  Stairs            Wheelchair Mobility    Modified Rankin (Stroke Patients Only)       Balance Overall balance assessment: No apparent balance deficits (not formally assessed)  Pertinent Vitals/Pain Pain Assessment Pain Assessment: No/denies pain    Home Living Family/patient expects to be discharged to:: Private residence Living Arrangements: Non-relatives/Friends (roommate) Available Help at Discharge: Friend(s);Available PRN/intermittently Type of Home: House Home Access: Level entry       Home Layout: One level Home Equipment: Conservation officer, nature (2 wheels);Cane - quad;Wheelchair - manual;BSC/3in1;Tub bench      Prior Function Prior Level of Function : Needs assist;Driving       Physical Assist : ADLs (physical) Mobility (physical):  Bed mobility;Transfers;Gait;Stairs   Mobility Comments: [place] ambulator without AD. ADLs Comments: Pt reports that his fiance helps him with lower body dressing and bathing. Able to complete all other ADL's himself. Assisted for IADL's.     Hand Dominance   Dominant Hand: Right    Extremity/Trunk Assessment   Upper Extremity Assessment Upper Extremity Assessment: Overall WFL for tasks assessed    Lower Extremity Assessment Lower Extremity Assessment: Overall WFL for tasks assessed;LLE deficits/detail LLE Deficits / Details: grossly -4/5 LLE Sensation: WNL LLE Coordination: WNL    Cervical / Trunk Assessment Cervical / Trunk Assessment: Normal  Communication   Communication: No difficulties  Cognition Arousal/Alertness: Awake/alert Behavior During Therapy: WFL for tasks assessed/performed Overall Cognitive Status: Within Functional Limits for tasks assessed                                          General Comments      Exercises     Assessment/Plan    PT Assessment All further PT needs can be met in the next venue of care  PT Problem List Decreased strength;Decreased activity tolerance;Decreased balance;Decreased mobility       PT Treatment Interventions      PT Goals (Current goals can be found in the Care Plan section)  Acute Rehab PT Goals Patient Stated Goal: return home with roommate to assist PT Goal Formulation: With patient Time For Goal Achievement: 01/12/23 Potential to Achieve Goals: Good    Frequency       Co-evaluation               AM-PAC PT "6 Clicks" Mobility  Outcome Measure Help needed turning from your back to your side while in a flat bed without using bedrails?: None Help needed moving from lying on your back to sitting on the side of a flat bed without using bedrails?: None Help needed moving to and from a bed to a chair (including a wheelchair)?: None Help needed standing up from a chair using your arms  (e.g., wheelchair or bedside chair)?: None Help needed to walk in hospital room?: A Little Help needed climbing 3-5 steps with a railing? : A Little 6 Click Score: 22    End of Session   Activity Tolerance: Patient tolerated treatment well;Patient limited by fatigue Patient left: in chair;with call bell/phone within reach Nurse Communication: Mobility status PT Visit Diagnosis: Unsteadiness on feet (R26.81);Other abnormalities of gait and mobility (R26.89);Muscle weakness (generalized) (M62.81)    Time: [id number] PT Time Calculation (min) (ACUTE ONLY): 11 min   Charges:   PT Evaluation $PT Eval Low Complexity: 1 Low PT Treatments $Therapeutic Activity: 8-22 mins        12:36 PM, 01/12/23 Lonell Grandchild, MPT Physical Therapist with [facility] [phone] office [phone] mobile phone

## 2023-01-12 NOTE — Progress Notes (Signed)
  Echocardiogram 2D Echocardiogram has been performed.  Kelly Key 01/12/2023, 9:26 AM

## 2023-01-13 DIAGNOSIS — I5033 Acute on chronic diastolic (congestive) heart failure: Secondary | ICD-10-CM | POA: Diagnosis not present

## 2023-01-13 DIAGNOSIS — E875 Hyperkalemia: Secondary | ICD-10-CM | POA: Diagnosis not present

## 2023-01-13 LAB — GLUCOSE, CAPILLARY
Glucose-Capillary: 141 mg/dL — ABNORMAL HIGH (ref 70–99)
Glucose-Capillary: 171 mg/dL — ABNORMAL HIGH (ref 70–99)

## 2023-01-13 LAB — RENAL FUNCTION PANEL
Albumin: 3.5 g/dL (ref 3.5–5.0)
Anion gap: 13 (ref 5–15)
BUN: 50 mg/dL — ABNORMAL HIGH (ref 6–20)
CO2: 25 mmol/L (ref 22–32)
Calcium: 8.4 mg/dL — ABNORMAL LOW (ref 8.9–10.3)
Chloride: 91 mmol/L — ABNORMAL LOW (ref 98–111)
Creatinine, Ser: 9.28 mg/dL — ABNORMAL HIGH (ref 0.61–1.24)
GFR, Estimated: 6 mL/min — ABNORMAL LOW (ref >60)
Glucose, Bld: 153 mg/dL — ABNORMAL HIGH (ref 70–99)
Phosphorus: 4.3 mg/dL (ref 2.5–4.6)
Potassium: 4 mmol/L (ref 3.5–5.1)
Sodium: 129 mmol/L — ABNORMAL LOW (ref 135–145)

## 2023-01-13 LAB — HEPATITIS B SURFACE ANTIBODY, QUANTITATIVE: Hep B S AB Quant (Post): 14.9 m[IU]/mL (ref >9.9)

## 2023-01-13 LAB — CBC
HCT: 26 % — ABNORMAL LOW (ref 39.0–52.0)
Hemoglobin: 8.3 g/dL — ABNORMAL LOW (ref 13.0–17.0)
MCH: 28.6 pg (ref 26.0–34.0)
MCHC: 31.9 g/dL (ref 30.0–36.0)
MCV: 89.7 fL (ref 80.0–100.0)
Platelets: 235 10*3/uL (ref 150–400)
RBC: 2.9 MIL/uL — ABNORMAL LOW (ref 4.22–5.81)
RDW: 15.2 % (ref 11.5–15.5)
WBC: 5.2 10*3/uL (ref 4.0–10.5)
nRBC: 0 % (ref 0.0–0.2)

## 2023-01-13 MED ORDER — CARVEDILOL 12.5 MG PO TABS
25.0000 mg | ORAL_TABLET | Freq: Two times a day (BID) | ORAL | Status: DC
  Administered 2023-01-13 (×2): 25 mg via ORAL
  Filled 2023-01-13 (×2): qty 2

## 2023-01-13 MED ORDER — HEPARIN SODIUM (PORCINE) 1000 UNIT/ML DIALYSIS: 20.0000 [IU]/kg | INTRAMUSCULAR | Status: DC | PRN

## 2023-01-13 NOTE — Care Management Important Message (Signed)
Important Message  Patient Details  Name: Kelly Key MRN: [id number] Date of Birth: May 11, 1971   Medicare Important Message Given:  N/A - LOS <3 / Initial given by admissions     [name] 01/13/2023, 11:00 AM

## 2023-01-13 NOTE — Plan of Care (Signed)

## 2023-01-13 NOTE — Progress Notes (Signed)
   HEMODIALYSIS TREATMENT NOTE:  3.5 hour treatment completed using left upper arm AVF. Goal met: 3.5 liters removed without interruption in UF.  All blood was returned and hemostasis was achieved in 20 minutes.  No meds given.   Post-dialysis:   01/13/23 1925  Vital Signs  Temp 98 F (36.7 C)  Temp Source Oral  Pulse Rate 86  Pulse Rate Source Monitor  Resp 16  BP (!) 158/78  BP Location Right Arm  BP Method Automatic  Oxygen Therapy  SpO2 97 %  O2 Device Room Air  Dialysis Weight  Weight (!) 137.7 kg  Type of Weight Post-Dialysis  Estimated Dry Weight 138.5 kg  Post Treatment  Dialyzer Clearance Lightly streaked  Duration of HD Treatment -hour(s) 3.5 hour(s)  Hemodialysis Intake (mL) 0 mL  Liters Processed 80.8  Fluid Removed (mL) 3500 mL  Tolerated HD Treatment Yes  Post-Hemodialysis Comments Goal met  AVG/AVF Arterial Site Held (minutes) 10 minutes  AVG/AVF Venous Site Held (minutes) 10 minutes  Fistula / Graft Left Upper arm Arteriovenous fistula  Placement Date/Time: 11/11/19 0843   Placed prior to admission: No  Orientation: Left  Access Location: Upper arm  Access Type: Arteriovenous fistula  Fistula / Graft Assessment Thrill;Bruit  Status Patent    Rockwell Alexandria, RN AP [facility]

## 2023-01-13 NOTE — Progress Notes (Signed)
Patient ID: Kelly Key, male   DOB: June 18, 1971, 52 y.o.   MRN: [id number] S: Feels much better this morning O:BP (!) 170/98 (BP Location: Right Arm) Comment: MD Aware  Pulse 86   Temp 99 F (37.2 C)   Resp 20   Ht 5\' 6"  (1.676 m)   Wt (!) 139.3 kg   SpO2 99%   BMI 49.55 kg/m   Intake/Output Summary (Last 24 hours) at 01/13/2023 1006 Last data filed at 01/13/2023 0643 Gross per 24 hour  Intake 240 ml  Output 3825 ml  Net -3585 ml   Intake/Output: I/O last 3 completed shifts: In: 1520 [P.O.:1520] Out: [id number] [Urine:425; Other:3500]  Intake/Output this shift:  No intake/output data recorded. Weight change: 0.917 kg Gen: NAD CVS: RRR Resp:CTA Abd: +BS, soft, NT/ND Ext: trace pretibial edema, left foot wrapped, LUE AVF +T/B  Recent Labs  Lab 01/11/23 2342 01/12/23 0456  NA 130* 132*  K 6.3* 4.8  CL 93* 94*  CO2 26 24  GLUCOSE 151* 176*  BUN 51* 54*  CREATININE 8.66* 9.34*  ALBUMIN  --  3.4*  CALCIUM 8.4* 8.4*  PHOS  --  3.5  AST  --  21  ALT  --  18   Liver Function Tests: Recent Labs  Lab 01/12/23 0456  AST 21  ALT 18  ALKPHOS 54  BILITOT 0.5  PROT 7.2  ALBUMIN 3.4*   No results for input(s): "LIPASE", "AMYLASE" in the last 168 hours. No results for input(s): "AMMONIA" in the last 168 hours. CBC: Recent Labs  Lab 01/11/23 2342 01/12/23 0456  WBC 9.7 7.5  NEUTROABS 7.5  --   HGB 8.6* 7.9*  HCT 28.0* 25.1*  MCV 92.7 93.0  PLT 227 202   Cardiac Enzymes: No results for input(s): "CKTOTAL", "CKMB", "CKMBINDEX", "TROPONINI" in the last 168 hours. CBG: Recent Labs  Lab 01/12/23 0743 01/12/23 1729 01/12/23 2054 01/13/23 0754  GLUCAP 135* 137* 228* 141*    Iron Studies:  Recent Labs    01/12/23 0456  IRON 21*  TIBC 305   Studies/Results: ECHOCARDIOGRAM COMPLETE  Result Date: 01/12/2023    ECHOCARDIOGRAM REPORT   Patient Name:   Kelly Key Date of Exam: 01/12/2023 Medical Rec #:  [id number]    Height:       66.0 in Accession #:     [id number]   Weight:       315.1 lb Date of Birth:  04-Jun-1971     BSA:          2.426 m Patient Age:    52 years     BP:           137/69 mmHg Patient Gender: M            HR:           84 bpm. Exam Location:  [facility] Procedure: 2D Echo, Cardiac Doppler, Color Doppler and Intracardiac            Opacification Agent Indications:    CHF-Acute Diastolic [id number]  History:        Patient has prior history of Echocardiogram examinations, most                 recent 05/15/2021. CHF, Signs/Symptoms:Dyspnea; Risk                 Factors:Non-Smoker, Hypertension and Diabetes.  Sonographer:    Greer Pickerel Referring Phys: [id number] Kayleen Memos  Sonographer Comments: Patient is obese. Image  acquisition challenging due to patient body habitus, Image acquisition challenging due to COPD and Image acquisition challenging due to respiratory motion. IMPRESSIONS  1. Left ventricular ejection fraction, by estimation, is 60 to 65%. The left ventricle has normal function. The left ventricle has no regional wall motion abnormalities. There is moderate left ventricular hypertrophy. Left ventricular diastolic parameters are consistent with Grade II diastolic dysfunction (pseudonormalization).  2. Right ventricular systolic function is normal. The right ventricular size is normal. There is normal pulmonary artery systolic pressure. The estimated right ventricular systolic pressure is 0000000 mmHg.  3. Left atrial size was severely dilated.  4. The mitral valve is abnormal. Trivial mitral valve regurgitation. No evidence of mitral stenosis. Moderate to severe mitral annular calcification.  5. The aortic valve is tricuspid. There is mild calcification of the aortic valve. Aortic valve regurgitation is not visualized. Mild aortic valve stenosis. Aortic valve mean gradient measures 12.0 mmHg. Aortic valve Vmax measures 2.34 m/s.  6. Aortic dilatation noted. There is moderate dilatation of the aortic root, measuring 42 mm. There is moderate  dilatation of the ascending aorta, measuring 44 mm.  7. The inferior vena cava is dilated in size with <50% respiratory variability, suggesting right atrial pressure of 15 mmHg. Comparison(s): No significant change from prior study. FINDINGS  Left Ventricle: Left ventricular ejection fraction, by estimation, is 60 to 65%. The left ventricle has normal function. The left ventricle has no regional wall motion abnormalities. Definity contrast agent was given IV to delineate the left ventricular  endocardial borders. The left ventricular internal cavity size was normal in size. There is moderate left ventricular hypertrophy. Left ventricular diastolic parameters are consistent with Grade II diastolic dysfunction (pseudonormalization). The E/e' is 23.3. Right Ventricle: The right ventricular size is normal. No increase in right ventricular wall thickness. Right ventricular systolic function is normal. There is normal pulmonary artery systolic pressure. The tricuspid regurgitant velocity is 1.79 m/s, and  with an assumed right atrial pressure of 15 mmHg, the estimated right ventricular systolic pressure is 0000000 mmHg. Left Atrium: Left atrial size was severely dilated. Right Atrium: Right atrial size was normal in size. Pericardium: There is no evidence of pericardial effusion. Mitral Valve: The mitral valve is abnormal. Moderate to severe mitral annular calcification. Trivial mitral valve regurgitation. No evidence of mitral valve stenosis. MV peak gradient, 10.9 mmHg. The mean mitral valve gradient is 5.0 mmHg. Tricuspid Valve: The tricuspid valve is normal in structure. Tricuspid valve regurgitation is not demonstrated. No evidence of tricuspid stenosis. Aortic Valve: The aortic valve is tricuspid. There is mild calcification of the aortic valve. Aortic valve regurgitation is not visualized. Mild aortic stenosis is present. Aortic valve mean gradient measures 12.0 mmHg. Aortic valve peak gradient measures 21.9 mmHg.  Aortic valve area, by VTI measures 1.85 cm. Pulmonic Valve: The pulmonic valve was not well visualized. Pulmonic valve regurgitation is not visualized. No evidence of pulmonic stenosis. Aorta: Aortic dilatation noted. There is moderate dilatation of the aortic root, measuring 42 mm. There is moderate dilatation of the ascending aorta, measuring 44 mm. Venous: The inferior vena cava is dilated in size with less than 50% respiratory variability, suggesting right atrial pressure of 15 mmHg. IAS/Shunts: The interatrial septum was not well visualized.  LEFT VENTRICLE PLAX 2D LVIDd:         6.00 cm   Diastology LVIDs:         3.80 cm   LV e' medial:    7.30 cm/s LV PW:  1.30 cm   LV E/e' medial:  23.3 LV IVS:        0.90 cm   LV e' lateral:   7.77 cm/s LVOT diam:     2.10 cm   LV E/e' lateral: 21.9 LV SV:         71 LV SV Index:   29 LVOT Area:     3.46 cm  RIGHT VENTRICLE RV S prime:     17.10 cm/s TAPSE (M-mode): 2.3 cm LEFT ATRIUM              Index        RIGHT ATRIUM           Index LA diam:        4.80 cm  1.98 cm/m   RA Area:     20.40 cm LA Vol (A2C):   111.0 ml 45.75 ml/m  RA Volume:   57.90 ml  23.87 ml/m LA Vol (A4C):   124.0 ml 51.11 ml/m LA Biplane Vol: 123.0 ml 50.70 ml/m  AORTIC VALVE                     PULMONIC VALVE AV Area (Vmax):    1.60 cm      PR End Diast Vel: 6.76 msec AV Area (Vmean):   1.81 cm AV Area (VTI):     1.85 cm AV Vmax:           234.00 cm/s AV Vmean:          140.667 cm/s AV VTI:            0.381 m AV Peak Grad:      21.9 mmHg AV Mean Grad:      12.0 mmHg LVOT Vmax:         108.00 cm/s LVOT Vmean:        73.500 cm/s LVOT VTI:          0.204 m LVOT/AV VTI ratio: 0.54  AORTA Ao Root diam: 4.20 cm Ao Asc diam:  4.40 cm MITRAL VALVE                TRICUSPID VALVE MV Area (PHT): 3.39 cm     TR Peak grad:   12.8 mmHg MV Area VTI:   1.76 cm     TR Vmax:        179.00 cm/s MV Peak grad:  10.9 mmHg MV Mean grad:  5.0 mmHg     SHUNTS MV Vmax:       1.65 m/s     Systemic VTI:   0.20 m MV Vmean:      100.0 cm/s   Systemic Diam: 2.10 cm MV Decel Time: 224 msec MR Peak grad: 80.6 mmHg MR Vmax:      449.00 cm/s MV E velocity: 170.00 cm/s MV A velocity: 103.00 cm/s MV E/A ratio:  1.65 Vishnu Priya Mallipeddi Electronically signed by Lorelee Cover Mallipeddi Signature Date/Time: 01/12/2023/2:19:24 PM    Final    DG Chest Portable 1 View  Result Date: 01/11/2023 CLINICAL DATA:  Short of breath EXAM: PORTABLE CHEST 1 VIEW COMPARISON:  11/28/2022, 11/26/2022, 11/04/2022 FINDINGS: Borderline cardiomegaly with slight central congestion. No acute airspace disease or pleural effusion. No pneumothorax. Central airways thickening is suspected. IMPRESSION: Borderline cardiomegaly with slight central vascular congestion. Suspect central airways thickening but no acute confluent airspace disease Electronically Signed   By: Donavan Foil M.D.   On: 01/11/2023 23:01    aspirin EC  81 mg Oral Daily   atorvastatin  80 mg Oral Daily   carvedilol  25 mg Oral BID WC   Chlorhexidine Gluconate Cloth  6 each Topical Q0600   clopidogrel  75 mg Oral Daily   darbepoetin (ARANESP) injection - DIALYSIS  200 mcg Subcutaneous Q Mon-1800   ezetimibe  10 mg Oral QPM   gabapentin  100 mg Oral TID   heparin  5,000 Units Subcutaneous Q8H   insulin aspart  0-5 Units Subcutaneous QHS   insulin aspart  0-6 Units Subcutaneous TID WC   insulin detemir  20 Units Subcutaneous Q2200   pantoprazole  40 mg Oral Daily   sevelamer carbonate  2,400 mg Oral TID WC    BMET    Component Value Date/Time   NA 132 (L) 01/12/2023 0456   NA 137 06/10/2021 0955   K 4.8 01/12/2023 0456   CL 94 (L) 01/12/2023 0456   CO2 24 01/12/2023 0456   GLUCOSE 176 (H) 01/12/2023 0456   BUN 54 (H) 01/12/2023 0456   BUN 66 (H) 06/10/2021 0955   CREATININE 9.34 (H) 01/12/2023 0456   CALCIUM 8.4 (L) 01/12/2023 0456   GFRNONAA 6 (L) 01/12/2023 0456   GFRAA TEST CANCELLED PER RN 05/18/2021 1147   CBC    Component Value Date/Time    WBC 7.5 01/12/2023 0456   RBC 2.70 (L) 01/12/2023 0456   HGB 7.9 (L) 01/12/2023 0456   HGB 10.5 (L) 06/10/2021 0952   HCT 25.1 (L) 01/12/2023 0456   HCT 31.5 (L) 06/10/2021 0952   PLT 202 01/12/2023 0456   PLT 310 06/10/2021 0952   MCV 93.0 01/12/2023 0456   MCV 88 06/10/2021 0952   MCH 29.3 01/12/2023 0456   MCHC 31.5 01/12/2023 0456   RDW 15.6 (H) 01/12/2023 0456   RDW 14.8 06/10/2021 0952   LYMPHSABS 1.0 01/11/2023 2342   MONOABS 0.9 01/11/2023 2342   EOSABS 0.2 01/11/2023 2342   BASOSABS 0.0 01/11/2023 2342    Dialysis Orders: [facility] TTS 4:15hr, 450/500, EDW 138.5kg, 3K/2.5Ca, heparin 2600u loading + 1000u/hr - Mircera 30 mcg (last given on 11/25/22) - Venofer 50 mg weekly (last given on 11/25/22) - Calcitriol 2.5 mcg three days a week   Assessment/Plan:  Acute on chronic diastolic CHF - sounds like he has large idwg and not able to get to edw.  Improved with HD yesterday.  Will plan for another HD session with UF as tolerated.  ESRD -  as above, plan for HD today to keep TTS schedule.  Hypertension/volume  - as above, volume overloaded and UF as able.   Anemia  - acute drop in Hgb.  Guaiac stool and transfuse as needed.  Plan for ESA and check iron stores  Metabolic bone disease -   continue with home meds  Nutrition - renal diet, carb modified Hyperkalemia - improved with Lokelma.  Will need to change to 2K bath. Hyponatremia - due to CHF.  Will follow after HD and UF. S/p left second toe amputation - due to osteomyelitis.  Per primary svc.  Disposition - hopeful discharge to home after HD today.    Donetta Potts, MD [facility]

## 2023-01-13 NOTE — Discharge Summary (Signed)
Physician Discharge Summary  Kelly Key [id number] DOB: 1971-04-29 DOA: 01/11/2023  PCP: [name], MD  Admit date: 01/11/2023 Discharge date: 01/13/2023  Admitted From: Home Disposition: Home  Recommendations for Outpatient Follow-up:  Follow up with PCP in 1-2 weeks Follow-up with orthopedics, Sharol Given as scheduled on 01/15/2023 Follow-up with vascular surgery, Dr. Carlis Abbott as scheduled on 07/18/2023  Home Health: No Equipment/Devices: None  Discharge Condition: Stable CODE STATUS: Full code Diet recommendation: Renal diet with 1200 mL fluid restriction  History of present illness:  Kelly Key is a 52 y.o. male with past medical history significant for ESRD on HD TTS, chronic diastolic congestive heart failure, type 2 diabetes mellitus, diabetic polyneuropathy, HLD, HTN, GERD who presented to [facility] ED on 3/17 with complaint of progressive shortness of breath.  Onset 2 days ago.  Reports worse with lying flat associated with orthopnea.  Reports normal hemodialysis session on Saturday.  Admits to noncompliance with dietary restrictions.  Currently awaiting outpatient sleep study for evaluation for OSA.  Denies chest pain, no palpitations.   In the ED, temperature 98.1 F, HR 99, RR 20, BP 161/77, SpO2 97% on room air.  Sodium 130, potassium 6.3, chloride 93, CO2 26, glucose 151, BUN 51, creatinine 8.66.  WBC 9.7, hemoglobin 8.6, platelets 227.  BNP 660.0.  High sensitive troponin 25 followed by 29.  Covid-19/influenza A/B/RSV PCR negative.  Chest x-ray with borderline cardiomegaly with vascular congestion consistent with pulmonary edema/volume overload.  Received IV Lasix and Lokelma in the ED.  TRH consulted for admission for further evaluation management of acute on chronic CHF exacerbation/volume overload in the setting of ESRD, elevated troponin likely secondary to type II demand ischemia and hyperkalemia.  Hospital course:  Acute on chronic diastolic congestive heart  failure Patient presenting to ED with progressive shortness of breath over the last 2 days in the setting of dietary restriction noncompliance.  Reports normal HD session on Saturday.  Patient was oxygenating well on room air, afebrile without leukocytosis.  Elevated BNP and chest x-ray findings consistent with pulmonary edema.  Received IV Lasix in the ED. Nephrology consulted for continue HD while inpatient.  Received dialysis on 3/18 and 3/19.  Resume normal hemodialysis schedule on a TTS schedule on discharge.  Outpatient follow-up with nephrology.  Continue to encourage fluid restriction, low-salt diet.   Hyperkalemia: Resolved Potassium 6.3 on admission.  Treated with Lokelma, IV Lasix and initiation of hemodialysis with resolution of hyperkalemia.   Hyponatremia Sodium 130 on admission.  Etiology likely secondary to hypervolemic hyponatremia in the setting of volume overload.    Elevated troponin Etiology likely secondary to type II demand ischemia in the setting of volume overload versus poor renal clearance in the setting of ESRD.  Denies chest pain.  EKG with normal sinus rhythm, no concerning dynamic changes.  TTE with LVEF 60 to 65%, no regional wall motion abnormalities, grade 2 diastolic dysfunction, LA severely dilated, IVC dilated.   Anemia of chronic medical/renal disease Hemoglobin 7.9, stable.  Peripheral artery disease Hx osteomyelitis second toe s/p amputation Recent underwent second toe amputation by Dr. Sharol Given.  WBC RN consulted for continue wound management per orthopedics.  Outpatient follow-up with orthopedics 01/15/2023. Continue aspirin, statin, Plavix   ESRD on HD TTS Nephrology consulted for continue HD while inpatient   Essential hypertension Continue carvedilol, amlodipine, clonidine. Continue aspirin and statin   Hyperlipidemia Atorvastatin 80 mg p.o. daily, Zetia 10 mg p.o. daily   Type 2 diabetes mellitus with hyperglycemia  Home regimen includes Levemir  50 units subcutaneously nightly, Humalog sliding scale, Ozempic.  Hemoglobin A1c 7.4 on 10/18/2022.   Peripheral neuropathy  Gabapentin 100 mg p.o. 3 times daily   GERD Continue PPI   Suspected OSA versus OHS Pending outpatient sleep study.  Outpatient follow-up with PCP.   Morbid obesity Body mass index is 50.86 kg/m.  Discussed with patient needs for aggressive lifestyle changes/weight loss as this complicates all facets of care.  Outpatient follow-up with PCP.  Discharge Diagnoses:  Principal Problem:   Volume overload    Discharge Instructions  Discharge Instructions     Diet - low sodium heart healthy   Complete by: As directed    Discharge wound care:   Complete by: As directed    Dry dressings daily to the left foot surgical site and right great toe.   Increase activity slowly   Complete by: As directed       Allergies as of 01/13/2023       Reactions   Ozempic (0.25 Or 0.5 Mg-dose) [semaglutide(0.25 Or 0.5mg -dos)] Shortness Of Breath   Hydralazine Anxiety   Hallucinations    Lisinopril Swelling        Medication List     STOP taking these medications    ammonium lactate 12 % cream Commonly known as: AMLACTIN   calcium carbonate (dosed in mg elemental calcium) 1250 MG/5ML Susp   guaiFENesin-dextromethorphan 100-10 MG/5ML syrup Commonly known as: ROBITUSSIN DM   hydrOXYzine 25 MG tablet Commonly known as: ATARAX   nitroGLYCERIN 0.2 mg/hr patch Commonly known as: NITRODUR - Dosed in mg/24 hr   sulfamethoxazole-trimethoprim 800-160 MG tablet Commonly known as: BACTRIM DS   vardenafil 20 MG tablet Commonly known as: LEVITRA       TAKE these medications    Accu-Chek Aviva Plus test strip Generic drug: glucose blood Use as instructed TO CHECK BLOOD GLUCOSE THREE TIMES DAILY   acetaminophen 500 MG tablet Commonly known as: TYLENOL Take 1,000 mg by mouth every 6 (six) hours as needed for moderate pain or headache.   albuterol 108 (90  Base) MCG/ACT inhaler Commonly known as: VENTOLIN HFA Inhale 2 puffs into the lungs every 4 (four) hours as needed for wheezing or shortness of breath.   allopurinol 100 MG tablet Commonly known as: ZYLOPRIM Take 2 tablets (200 mg total) by mouth daily.   AMBULATORY NON FORMULARY MEDICATION Inject 0.2 ml by intracavernosal route as directed.  Medication Name: TriMix PGE 10 mcg Pap   30 mg Phent 1 mg   amLODipine 5 MG tablet Commonly known as: NORVASC Take 5 mg by mouth daily.   aspirin EC 81 MG tablet Take 1 tablet (81 mg total) by mouth daily.   atorvastatin 80 MG tablet Commonly known as: LIPITOR Take 1 tablet (80 mg total) by mouth daily.   B-D ULTRAFINE III SHORT PEN 31G X 8 MM Misc Generic drug: Insulin Pen Needle 1 each by Does not apply route as directed.   carvedilol 25 MG tablet Commonly known as: COREG Take 1 tablet (25 mg total) by mouth 2 (two) times daily with a meal.   cloNIDine 0.3 MG tablet Commonly known as: CATAPRES Take 0.3 mg by mouth 3 (three) times daily.   clopidogrel 75 MG tablet Commonly known as: PLAVIX Take 1 tablet (75 mg total) by mouth daily.   esomeprazole 20 MG capsule Commonly known as: NEXIUM Take 20 mg by mouth daily.   ezetimibe 10 MG tablet Commonly known as: ZETIA Take  10 mg by mouth every evening.   fluticasone 50 MCG/ACT nasal spray Commonly known as: FLONASE Place 1 spray into both nostrils daily as needed for allergies or rhinitis.   gabapentin 100 MG capsule Commonly known as: NEURONTIN Take 1 capsule (100 mg total) by mouth 3 (three) times daily.   HumaLOG KwikPen 100 UNIT/ML KwikPen Generic drug: insulin lispro Inject 8-14 Units into the skin 3 (three) times daily.   Ipratropium-Albuterol 20-100 MCG/ACT Aers respimat Commonly known as: COMBIVENT Inhale 1 puff into the lungs every 6 (six) hours as needed for wheezing or shortness of breath.   Levemir FlexTouch 100 UNIT/ML FlexPen Generic drug: insulin  detemir INJECT 50 UNITS SUBCUTANEOUSLY AT BEDTIME What changed: See the new instructions.   multivitamin Tabs tablet Take 1 tablet by mouth at bedtime.   Ozempic (1 MG/DOSE) 4 MG/3ML Sopn Generic drug: Semaglutide (1 MG/DOSE) Inject 1 mg into the skin once a week. Fridays.   sevelamer carbonate 800 MG tablet Commonly known as: RENVELA Take 3 tablets (2,400 mg total) by mouth 3 (three) times daily with meals.               Discharge Care Instructions  (From admission, onward)           Start     Ordered   01/13/23 0000  Discharge wound care:       Comments: Dry dressings daily to the left foot surgical site and right great toe.   01/13/23 1100            Follow-up Information     [name], MD. Schedule an appointment as soon as possible for a visit in 1 week(s).   Specialty: Internal Medicine Contact information: [place] [address] [phone]         Newt Minion, MD. Go on 01/15/2023.   Specialty: Orthopedic Surgery Contact information: [place] [address] [phone]         Marty Heck, MD. Go on 07/14/2023.   Specialty: Vascular Surgery Contact information: [place] [address] [phone]                Allergies  Allergen Reactions   Ozempic (0.25 Or 0.5 Mg-Dose) [Semaglutide(0.25 Or 0.5mg -Dos)] Shortness Of Breath   Hydralazine Anxiety    Hallucinations    Lisinopril Swelling    Consultations: Nephrology   Procedures/Studies: ECHOCARDIOGRAM COMPLETE  Result Date: 01/12/2023    ECHOCARDIOGRAM REPORT   Patient Name:   XAYVION BERQUIST Logiudice Date of Exam: 01/12/2023 Medical Rec #:  [id number]    Height:       66.0 in Accession #:    [id number]   Weight:       315.1 lb Date of Birth:  Aug 07, 1971     BSA:          2.426 m Patient Age:    22 years     BP:           137/69 mmHg Patient Gender: M            HR:           84 bpm. Exam Location:  [facility]  Procedure: 2D Echo, Cardiac Doppler, Color Doppler and Intracardiac            Opacification Agent Indications:    CHF-Acute Diastolic [id number]  History:        Patient has prior history of Echocardiogram examinations, most  recent 05/15/2021. CHF, Signs/Symptoms:Dyspnea; Risk                 Factors:Non-Smoker, Hypertension and Diabetes.  Sonographer:    Greer Pickerel Referring Phys: [id number] Kayleen Memos  Sonographer Comments: Patient is obese. Image acquisition challenging due to patient body habitus, Image acquisition challenging due to COPD and Image acquisition challenging due to respiratory motion. IMPRESSIONS  1. Left ventricular ejection fraction, by estimation, is 60 to 65%. The left ventricle has normal function. The left ventricle has no regional wall motion abnormalities. There is moderate left ventricular hypertrophy. Left ventricular diastolic parameters are consistent with Grade II diastolic dysfunction (pseudonormalization).  2. Right ventricular systolic function is normal. The right ventricular size is normal. There is normal pulmonary artery systolic pressure. The estimated right ventricular systolic pressure is 0000000 mmHg.  3. Left atrial size was severely dilated.  4. The mitral valve is abnormal. Trivial mitral valve regurgitation. No evidence of mitral stenosis. Moderate to severe mitral annular calcification.  5. The aortic valve is tricuspid. There is mild calcification of the aortic valve. Aortic valve regurgitation is not visualized. Mild aortic valve stenosis. Aortic valve mean gradient measures 12.0 mmHg. Aortic valve Vmax measures 2.34 m/s.  6. Aortic dilatation noted. There is moderate dilatation of the aortic root, measuring 42 mm. There is moderate dilatation of the ascending aorta, measuring 44 mm.  7. The inferior vena cava is dilated in size with <50% respiratory variability, suggesting right atrial pressure of 15 mmHg. Comparison(s): No significant change from prior  study. FINDINGS  Left Ventricle: Left ventricular ejection fraction, by estimation, is 60 to 65%. The left ventricle has normal function. The left ventricle has no regional wall motion abnormalities. Definity contrast agent was given IV to delineate the left ventricular  endocardial borders. The left ventricular internal cavity size was normal in size. There is moderate left ventricular hypertrophy. Left ventricular diastolic parameters are consistent with Grade II diastolic dysfunction (pseudonormalization). The E/e' is 23.3. Right Ventricle: The right ventricular size is normal. No increase in right ventricular wall thickness. Right ventricular systolic function is normal. There is normal pulmonary artery systolic pressure. The tricuspid regurgitant velocity is 1.79 m/s, and  with an assumed right atrial pressure of 15 mmHg, the estimated right ventricular systolic pressure is 0000000 mmHg. Left Atrium: Left atrial size was severely dilated. Right Atrium: Right atrial size was normal in size. Pericardium: There is no evidence of pericardial effusion. Mitral Valve: The mitral valve is abnormal. Moderate to severe mitral annular calcification. Trivial mitral valve regurgitation. No evidence of mitral valve stenosis. MV peak gradient, 10.9 mmHg. The mean mitral valve gradient is 5.0 mmHg. Tricuspid Valve: The tricuspid valve is normal in structure. Tricuspid valve regurgitation is not demonstrated. No evidence of tricuspid stenosis. Aortic Valve: The aortic valve is tricuspid. There is mild calcification of the aortic valve. Aortic valve regurgitation is not visualized. Mild aortic stenosis is present. Aortic valve mean gradient measures 12.0 mmHg. Aortic valve peak gradient measures 21.9 mmHg. Aortic valve area, by VTI measures 1.85 cm. Pulmonic Valve: The pulmonic valve was not well visualized. Pulmonic valve regurgitation is not visualized. No evidence of pulmonic stenosis. Aorta: Aortic dilatation noted. There is  moderate dilatation of the aortic root, measuring 42 mm. There is moderate dilatation of the ascending aorta, measuring 44 mm. Venous: The inferior vena cava is dilated in size with less than 50% respiratory variability, suggesting right atrial pressure of 15 mmHg. IAS/Shunts: The interatrial septum was not  well visualized.  LEFT VENTRICLE PLAX 2D LVIDd:         6.00 cm   Diastology LVIDs:         3.80 cm   LV e' medial:    7.30 cm/s LV PW:         1.30 cm   LV E/e' medial:  23.3 LV IVS:        0.90 cm   LV e' lateral:   7.77 cm/s LVOT diam:     2.10 cm   LV E/e' lateral: 21.9 LV SV:         71 LV SV Index:   29 LVOT Area:     3.46 cm  RIGHT VENTRICLE RV S prime:     17.10 cm/s TAPSE (M-mode): 2.3 cm LEFT ATRIUM              Index        RIGHT ATRIUM           Index LA diam:        4.80 cm  1.98 cm/m   RA Area:     20.40 cm LA Vol (A2C):   111.0 ml 45.75 ml/m  RA Volume:   57.90 ml  23.87 ml/m LA Vol (A4C):   124.0 ml 51.11 ml/m LA Biplane Vol: 123.0 ml 50.70 ml/m  AORTIC VALVE                     PULMONIC VALVE AV Area (Vmax):    1.60 cm      PR End Diast Vel: 6.76 msec AV Area (Vmean):   1.81 cm AV Area (VTI):     1.85 cm AV Vmax:           234.00 cm/s AV Vmean:          140.667 cm/s AV VTI:            0.381 m AV Peak Grad:      21.9 mmHg AV Mean Grad:      12.0 mmHg LVOT Vmax:         108.00 cm/s LVOT Vmean:        73.500 cm/s LVOT VTI:          0.204 m LVOT/AV VTI ratio: 0.54  AORTA Ao Root diam: 4.20 cm Ao Asc diam:  4.40 cm MITRAL VALVE                TRICUSPID VALVE MV Area (PHT): 3.39 cm     TR Peak grad:   12.8 mmHg MV Area VTI:   1.76 cm     TR Vmax:        179.00 cm/s MV Peak grad:  10.9 mmHg MV Mean grad:  5.0 mmHg     SHUNTS MV Vmax:       1.65 m/s     Systemic VTI:  0.20 m MV Vmean:      100.0 cm/s   Systemic Diam: 2.10 cm MV Decel Time: 224 msec MR Peak grad: 80.6 mmHg MR Vmax:      449.00 cm/s MV E velocity: 170.00 cm/s MV A velocity: 103.00 cm/s MV E/A ratio:  1.65 Vishnu Priya  Mallipeddi Electronically signed by Lorelee Cover Mallipeddi Signature Date/Time: 01/12/2023/2:19:24 PM    Final    DG Chest Portable 1 View  Result Date: 01/11/2023 CLINICAL DATA:  Short of breath EXAM: PORTABLE CHEST 1 VIEW COMPARISON:  11/28/2022, 11/26/2022, 11/04/2022 FINDINGS: Borderline cardiomegaly with slight central  congestion. No acute airspace disease or pleural effusion. No pneumothorax. Central airways thickening is suspected. IMPRESSION: Borderline cardiomegaly with slight central vascular congestion. Suspect central airways thickening but no acute confluent airspace disease Electronically Signed   By: Donavan Foil M.D.   On: 01/11/2023 23:01   VAS [place] LOWER EXTREMITY ARTERIAL DUPLEX  Result Date: 01/06/2023 LOWER EXTREMITY ARTERIAL DUPLEX STUDY Patient Name:  MYKE MAMMONE  Date of Exam:   01/06/2023 Medical Rec #: [id number]     Accession #:    [id number] Date of Birth: Aug 25, 1971      Patient Gender: M Patient Age:   50 years Exam Location:  [facility] Procedure:      VAS [place] LOWER EXTREMITY ARTERIAL DUPLEX Referring Phys: Monica Martinez --------------------------------------------------------------------------------  Indications: Peripheral artery disease. High Risk Factors: Hypertension, hyperlipidemia, Diabetes, no history of                    smoking.  Vascular Interventions: 12/01/2022- Angioplasty and stent of left SFA (7 mm x 120                         mm drug-coated Eluvia postdilated with a 6 mm Mustang).                         Left anterior tibial artery angioplasty (3 mm x 220 mm                         [place]). Current ABI:            right=1.05/0.70, left=0.98/0.66 Limitations: Depth of vessels Comparison Study: No prior study Performing Technologist: Maudry Mayhew MHA, RDMS, RVT, RDCS  Examination Guidelines: A complete evaluation includes B-mode imaging, spectral Doppler, color Doppler, and power Doppler as needed of all accessible portions of each  vessel. Bilateral testing is considered an integral part of a complete examination. Limited examinations for reoccurring indications may be performed as noted.   +-----------+--------+-----+--------+---------+------------------+ LEFT       PSV cm/sRatioStenosisWaveform Comments           +-----------+--------+-----+--------+---------+------------------+ CFA Distal 151                  triphasic                   +-----------+--------+-----+--------+---------+------------------+ DFA        61                   biphasic                    +-----------+--------+-----+--------+---------+------------------+ SFA Prox   115                  triphasic                   +-----------+--------+-----+--------+---------+------------------+ POP Distal 75                   triphasic                   +-----------+--------+-----+--------+---------+------------------+ TP Trunk   87                   triphasic                   +-----------+--------+-----+--------+---------+------------------+ ATA Distal 115  triphasic                   +-----------+--------+-----+--------+---------+------------------+ PTA Distal 75                   triphasic                   +-----------+--------+-----+--------+---------+------------------+ PERO Distal                              Unable to insonate +-----------+--------+-----+--------+---------+------------------+  Left Stent(s): +---------------+--------+--------+---------+--------+ SFA            PSV cm/sStenosisWaveform Comments +---------------+--------+--------+---------+--------+ Prox to Stent  92              triphasic         +---------------+--------+--------+---------+--------+ Proximal Stent 119             triphasic         +---------------+--------+--------+---------+--------+ Mid Stent      72              triphasic         +---------------+--------+--------+---------+--------+  Distal Stent   80              triphasic         +---------------+--------+--------+---------+--------+ Distal to Stent81              triphasic         +---------------+--------+--------+---------+--------+    Summary: Left: Patent stent with no evidence of stenosis in the superficial femoral artery.  See table(s) above for measurements and observations. Electronically signed by Monica Martinez MD on 01/06/2023 at 4:09:31 PM.    Final    VAS [place] ABI WITH/WO TBI  Result Date: 01/06/2023  LOWER EXTREMITY DOPPLER STUDY Patient Name:  KANEN HORITA  Date of Exam:   01/06/2023 Medical Rec #: [id number]     Accession #:    [id number] Date of Birth: 1970/11/19      Patient Gender: M Patient Age:   43 years Exam Location:  [facility] Procedure:      VAS [place] ABI WITH/WO TBI Referring Phys: Monica Martinez --------------------------------------------------------------------------------  Indications: Peripheral artery disease. High Risk Factors: Hypertension, hyperlipidemia.  Comparison Study: No prior study Performing Technologist: Maudry Mayhew MHA, RVT, RDCS, RDMS  Examination Guidelines: A complete evaluation includes at minimum, Doppler waveform signals and systolic blood pressure reading at the level of bilateral brachial, anterior tibial, and posterior tibial arteries, when vessel segments are accessible. Bilateral testing is considered an integral part of a complete examination. Photoelectric Plethysmograph (PPG) waveforms and toe systolic pressure readings are included as required and additional duplex testing as needed. Limited examinations for reoccurring indications may be performed as noted.  ABI Findings: +---------+------------------+-----+---------+--------+ Right    Rt Pressure (mmHg)IndexWaveform Comment  +---------+------------------+-----+---------+--------+ Brachial 162                                       +---------+------------------+-----+---------+--------+ PTA      157               0.97 triphasic         +---------+------------------+-----+---------+--------+ DP       170               1.05 triphasic         +---------+------------------+-----+---------+--------+ Fonnie Jarvis  0.70                   +---------+------------------+-----+---------+--------+ +---------+------------------+-----+---------+-------+ Left     Lt Pressure (mmHg)IndexWaveform Comment +---------+------------------+-----+---------+-------+ PTA      147               0.91 triphasic        +---------+------------------+-----+---------+-------+ DP       159               0.98 triphasic        +---------+------------------+-----+---------+-------+ Great Toe107               0.66                  +---------+------------------+-----+---------+-------+ +-------+-----------+-----------+------------+------------+ ABI/TBIToday's ABIToday's TBIPrevious ABIPrevious TBI +-------+-----------+-----------+------------+------------+ Right  1.05       0.70                                +-------+-----------+-----------+------------+------------+ Left   0.98       0.66                                +-------+-----------+-----------+------------+------------+  Summary: Right: Resting right ankle-brachial index is within normal range. The right toe-brachial index is normal. Left: Resting left ankle-brachial index is within normal range. The left toe-brachial index is abnormal. *See table(s) above for measurements and observations.  Electronically signed by Monica Martinez MD on 01/06/2023 at 4:09:14 PM.    Final      Subjective: Patient seen bedside resting comfortably.  Sitting up at edge of bed eating breakfast.  Waiting for repeat hemodialysis today.  Feels much improved in regards to his shortness of breath.  No other specific questions or concerns at this time.  Has follow-up  scheduled with orthopedics on 3/21.  Denies headache, no dizziness, no chest pain, no palpitations, no current shortness of breath, no abdominal pain, no fever/chills/night sweats, no nausea/vomiting/diarrhea, no focal weakness, no fatigue, no paresthesias.  No acute events overnight per nursing staff.  Discharge Exam: Vitals:   01/12/23 2053 01/13/23 0533  BP: (!) 146/81 (!) 170/98  Pulse: 93 86  Resp: 20 20  Temp: 99.8 F (37.7 C) 99 F (37.2 C)  SpO2: 100% 99%   Vitals:   01/12/23 1715 01/12/23 2053 01/13/23 0500 01/13/23 0533  BP: (!) 142/74 (!) 146/81  (!) 170/98  Pulse: 88 93  86  Resp: 14 20  20   Temp: 97.9 F (36.6 C) 99.8 F (37.7 C)  99 F (37.2 C)  TempSrc: Oral     SpO2: 95% 100%  99%  Weight: (!) 140 kg  (!) 139.3 kg   Height:        Physical Exam: GEN: NAD, alert and oriented x 3, obese HEENT: NCAT, PERRL, EOMI, sclera clear, MMM PULM: CTAB w/o wheezes/crackles, normal respiratory effort, on room air CV: RRR w/o M/G/R GI: abd soft, NTND, NABS, no R/G/M MSK: 1-2+ peripheral edema, moves all extremities independently, noted dressing to left foot in place, clean/dry/intact NEURO: CN II-XII intact, no focal deficits, sensation to light touch intact PSYCH: normal mood/affect Integumentary: Dressing noted to left foot as above, otherwise no other concerning rashes/lesions/wounds noted on exposed skin surfaces.      The results of significant diagnostics from this hospitalization (including imaging, microbiology, ancillary and laboratory) are listed below for reference.  Microbiology: Recent Results (from the past 240 hour(s))  Resp panel by RT-PCR (RSV, Flu A&B, Covid) Anterior Nasal Swab     Status: None   Collection Time: 01/11/23 11:03 PM   Specimen: Anterior Nasal Swab  Result Value Ref Range Status   SARS Coronavirus 2 by RT PCR NEGATIVE NEGATIVE Final    Comment: (NOTE) SARS-CoV-2 target nucleic acids are NOT DETECTED.  The SARS-CoV-2 RNA is  generally detectable in upper respiratory specimens during the acute phase of infection. The lowest concentration of SARS-CoV-2 viral copies this assay can detect is 138 copies/mL. A negative result does not preclude SARS-Cov-2 infection and should not be used as the sole basis for treatment or other patient management decisions. A negative result may occur with  improper specimen collection/handling, submission of specimen other than nasopharyngeal swab, presence of viral mutation(s) within the areas targeted by this assay, and inadequate number of viral copies(<138 copies/mL). A negative result must be combined with clinical observations, patient history, and epidemiological information. The expected result is Negative.  Fact Sheet for Patients:  EntrepreneurPulse.com.au  Fact Sheet for Healthcare Providers:  IncredibleEmployment.be  This test is no t yet approved or cleared by the [place] FDA and  has been authorized for detection and/or diagnosis of SARS-CoV-2 by FDA under an Emergency Use Authorization (EUA). This EUA will remain  in effect (meaning this test can be used) for the duration of the COVID-19 declaration under Section 564(b)(1) of the Act, 21 U.S.C.section 360bbb-3(b)(1), unless the authorization is terminated  or revoked sooner.       Influenza A by PCR NEGATIVE NEGATIVE Final   Influenza B by PCR NEGATIVE NEGATIVE Final    Comment: (NOTE) The Xpert Xpress SARS-CoV-2/FLU/RSV plus assay is intended as an aid in the diagnosis of influenza from Nasopharyngeal swab specimens and should not be used as a sole basis for treatment. Nasal washings and aspirates are unacceptable for Xpert Xpress SARS-CoV-2/FLU/RSV testing.  Fact Sheet for Patients: EntrepreneurPulse.com.au  Fact Sheet for Healthcare Providers: IncredibleEmployment.be  This test is not yet approved or cleared by the [place] FDA and has been authorized for detection and/or diagnosis of SARS-CoV-2 by FDA under an Emergency Use Authorization (EUA). This EUA will remain in effect (meaning this test can be used) for the duration of the COVID-19 declaration under Section 564(b)(1) of the Act, 21 U.S.C. section 360bbb-3(b)(1), unless the authorization is terminated or revoked.     Resp Syncytial Virus by PCR NEGATIVE NEGATIVE Final    Comment: (NOTE) Fact Sheet for Patients: EntrepreneurPulse.com.au  Fact Sheet for Healthcare Providers: IncredibleEmployment.be  This test is not yet approved or cleared by the [place] FDA and has been authorized for detection and/or diagnosis of SARS-CoV-2 by FDA under an Emergency Use Authorization (EUA). This EUA will remain in effect (meaning this test can be used) for the duration of the COVID-19 declaration under Section 564(b)(1) of the Act, 21 U.S.C. section 360bbb-3(b)(1), unless the authorization is terminated or revoked.  Performed at [facility], [address] [place], [place] [address]      Labs: BNP (last 3 results) Recent Labs    11/04/22 0859 11/26/22 1851 01/11/23 2342  BNP 596.0* 510.0* [id number]*   Basic Metabolic Panel: Recent Labs  Lab 01/11/23 2342 01/12/23 0456  NA 130* 132*  K 6.3* 4.8  CL 93* 94*  CO2 26 24  GLUCOSE 151* 176*  BUN 51* 54*  CREATININE 8.66* 9.34*  CALCIUM 8.4* 8.4*  MG  --  2.2  PHOS  --  3.5   Liver Function Tests: Recent Labs  Lab 01/12/23 0456  AST 21  ALT 18  ALKPHOS 54  BILITOT 0.5  PROT 7.2  ALBUMIN 3.4*   No results for input(s): "LIPASE", "AMYLASE" in the last 168 hours. No results for input(s): "AMMONIA" in the last 168 hours. CBC: Recent Labs  Lab 01/11/23 2342 01/12/23 0456  WBC 9.7 7.5  NEUTROABS 7.5  --   HGB 8.6* 7.9*  HCT 28.0* 25.1*  MCV 92.7 93.0  PLT 227 202   Cardiac Enzymes: No results for input(s): "CKTOTAL", "CKMB",  "CKMBINDEX", "TROPONINI" in the last 168 hours. BNP: Invalid input(s): "POCBNP" CBG: Recent Labs  Lab 01/12/23 0743 01/12/23 1729 01/12/23 2054 01/13/23 0754  GLUCAP 135* 137* 228* 141*   D-Dimer No results for input(s): "DDIMER" in the last 72 hours. Hgb A1c No results for input(s): "HGBA1C" in the last 72 hours. Lipid Profile No results for input(s): "CHOL", "HDL", "LDLCALC", "TRIG", "CHOLHDL", "LDLDIRECT" in the last 72 hours. Thyroid function studies No results for input(s): "TSH", "T4TOTAL", "T3FREE", "THYROIDAB" in the last 72 hours.  Invalid input(s): "FREET3" Anemia work up Recent Labs    01/12/23 0456  TIBC 305  IRON 21*   Urinalysis    Component Value Date/Time   COLORURINE STRAW (A) 04/20/2021 1740   APPEARANCEUR CLEAR 04/20/2021 1740   LABSPEC 1.011 04/20/2021 1740   PHURINE 8.0 04/20/2021 1740   GLUCOSEU 150 (A) 04/20/2021 1740   HGBUR NEGATIVE 04/20/2021 1740   BILIRUBINUR NEGATIVE 04/20/2021 1740   KETONESUR NEGATIVE 04/20/2021 1740   PROTEINUR 100 (A) 04/20/2021 1740   UROBILINOGEN 0.2 06/11/2012 0518   NITRITE NEGATIVE 04/20/2021 1740   LEUKOCYTESUR NEGATIVE 04/20/2021 1740   Sepsis Labs Recent Labs  Lab 01/11/23 2342 01/12/23 0456  WBC 9.7 7.5   Microbiology Recent Results (from the past 240 hour(s))  Resp panel by RT-PCR (RSV, Flu A&B, Covid) Anterior Nasal Swab     Status: None   Collection Time: 01/11/23 11:03 PM   Specimen: Anterior Nasal Swab  Result Value Ref Range Status   SARS Coronavirus 2 by RT PCR NEGATIVE NEGATIVE Final    Comment: (NOTE) SARS-CoV-2 target nucleic acids are NOT DETECTED.  The SARS-CoV-2 RNA is generally detectable in upper respiratory specimens during the acute phase of infection. The lowest concentration of SARS-CoV-2 viral copies this assay can detect is 138 copies/mL. A negative result does not preclude SARS-Cov-2 infection and should not be used as the sole basis for treatment or other patient  management decisions. A negative result may occur with  improper specimen collection/handling, submission of specimen other than nasopharyngeal swab, presence of viral mutation(s) within the areas targeted by this assay, and inadequate number of viral copies(<138 copies/mL). A negative result must be combined with clinical observations, patient history, and epidemiological information. The expected result is Negative.  Fact Sheet for Patients:  EntrepreneurPulse.com.au  Fact Sheet for Healthcare Providers:  IncredibleEmployment.be  This test is no t yet approved or cleared by the [place] FDA and  has been authorized for detection and/or diagnosis of SARS-CoV-2 by FDA under an Emergency Use Authorization (EUA). This EUA will remain  in effect (meaning this test can be used) for the duration of the COVID-19 declaration under Section 564(b)(1) of the Act, 21 U.S.C.section 360bbb-3(b)(1), unless the authorization is terminated  or revoked sooner.       Influenza A by PCR NEGATIVE NEGATIVE Final   Influenza B by PCR NEGATIVE  NEGATIVE Final    Comment: (NOTE) The Xpert Xpress SARS-CoV-2/FLU/RSV plus assay is intended as an aid in the diagnosis of influenza from Nasopharyngeal swab specimens and should not be used as a sole basis for treatment. Nasal washings and aspirates are unacceptable for Xpert Xpress SARS-CoV-2/FLU/RSV testing.  Fact Sheet for Patients: EntrepreneurPulse.com.au  Fact Sheet for Healthcare Providers: IncredibleEmployment.be  This test is not yet approved or cleared by the [place] FDA and has been authorized for detection and/or diagnosis of SARS-CoV-2 by FDA under an Emergency Use Authorization (EUA). This EUA will remain in effect (meaning this test can be used) for the duration of the COVID-19 declaration under Section 564(b)(1) of the Act, 21 U.S.C. section 360bbb-3(b)(1),  unless the authorization is terminated or revoked.     Resp Syncytial Virus by PCR NEGATIVE NEGATIVE Final    Comment: (NOTE) Fact Sheet for Patients: EntrepreneurPulse.com.au  Fact Sheet for Healthcare Providers: IncredibleEmployment.be  This test is not yet approved or cleared by the [place] FDA and has been authorized for detection and/or diagnosis of SARS-CoV-2 by FDA under an Emergency Use Authorization (EUA). This EUA will remain in effect (meaning this test can be used) for the duration of the COVID-19 declaration under Section 564(b)(1) of the Act, 21 U.S.C. section 360bbb-3(b)(1), unless the authorization is terminated or revoked.  Performed at [facility], [address], [place], [place] [address]      Time coordinating discharge: Over 30 minutes  SIGNED:   Tionne Dayhoff J [place] DO  [facility] 01/13/2023, 11:00 AM

## 2023-01-13 NOTE — Progress Notes (Signed)
Patient discharged via wheelchair with wife by car. Discharge instructions given to pt. With wife at bedside. Discharge instructions explained in great detail. Pt. And wife verbalized no questions or concerns regarding discharge paperwork. IV removed with tip intact.

## 2023-01-15 ENCOUNTER — Encounter: Payer: 59 | Admitting: Orthopedic Surgery

## 2023-01-19 ENCOUNTER — Ambulatory Visit (INDEPENDENT_AMBULATORY_CARE_PROVIDER_SITE_OTHER): Payer: 59 | Admitting: Orthopedic Surgery

## 2023-01-19 ENCOUNTER — Encounter: Payer: Self-pay | Admitting: Orthopedic Surgery

## 2023-01-19 DIAGNOSIS — S98132A Complete traumatic amputation of one left lesser toe, initial encounter: Secondary | ICD-10-CM

## 2023-01-19 NOTE — Progress Notes (Signed)
Office Visit Note   Patient: Kelly Key           Date of Birth: 02/02/1971           MRN: [id number] Visit Date: 01/19/2023              Requested by: [name], MD [address] [place],  [place] [address] PCP: [name], MD  Chief Complaint  Patient presents with   Left Foot - Routine Post Op    12/03/22 left second toe amputation      HPI: Patient is a 52 year old gentleman who presents status post left foot second ray amputation.  Patient also complains of some black ischemic changes over the dorsum of the third toe.  Assessment & Plan: Visit Diagnoses:  1. Amputation of toe of left foot ([place])     Plan: Sutures harvested today continue with protected weightbearing with elevation and compression.  Will reevaluate the third toe as the eschar resolves.  Follow-Up Instructions: Return in about 2 weeks (around 02/02/2023).   Ortho Exam  Patient is alert, oriented, no adenopathy, well-dressed, normal affect, normal respiratory effort. Examination there is no sausage digit swelling of the third toe no signs of infection no cellulitis.  There is an eschar over the PIP joint of the third toe we will leave this intact.  The surgical incision for the second ray is well-healed sutures harvested today.  There is significant swelling and discussed the importance of elevation and compression.  Imaging: No results found. No images are attached to the encounter.  Labs: Lab Results  Component Value Date   HGBA1C 7.4 (H) 10/18/2022   HGBA1C 8.0 (H) 11/13/2021   HGBA1C 8.2 (H) 05/14/2021   ESRSEDRATE 118 (H) 11/28/2022   ESRSEDRATE 68 (H) 11/13/2021   CRP 17.0 (H) 11/28/2022   CRP 3.5 (H) 11/05/2022   CRP 6.9 11/13/2021   LABURIC 7.1 07/17/2019   LABURIC 9.7 (H) 06/23/2019   LABURIC 9.9 (H) 02/20/2019   REPTSTATUS 12/02/2022 FINAL 11/27/2022   GRAMSTAIN  02/20/2019    CYTOSPIN SMEAR NO ORGANISMS SEEN WBC PRESENT, PREDOMINANTLY PMN Performed at [facility], [address] [place], [place] [address]    CULT  11/27/2022    NO GROWTH 5 DAYS Performed at [facility], [address], [place], [place] [address]      Lab Results  Component Value Date   ALBUMIN 3.5 01/13/2023   ALBUMIN 3.4 (L) 01/12/2023   ALBUMIN 2.6 (L) 12/06/2022   PREALBUMIN 22 11/28/2022    Lab Results  Component Value Date   MG 2.2 01/12/2023   MG 2.3 12/04/2022   MG 2.1 12/03/2022   No results found for: "VD25OH"  Lab Results  Component Value Date   PREALBUMIN 22 11/28/2022      Latest Ref Rng & Units 01/13/2023    3:00 PM 01/12/2023    4:56 AM 01/11/2023   11:42 PM  CBC EXTENDED  WBC 4.0 - 10.5 K/uL 5.2  7.5  9.7   RBC 4.22 - 5.81 MIL/uL 2.90  2.70  3.02   Hemoglobin 13.0 - 17.0 g/dL 8.3  7.9  8.6   HCT 39.0 - 52.0 % 26.0  25.1  28.0   Platelets 150 - 400 K/uL 235  202  227   NEUT# 1.7 - 7.7 K/uL   7.5   Lymph# 0.7 - 4.0 K/uL   1.0      There is no height or weight on file  to calculate BMI.  Orders:  No orders of the defined types were placed in this encounter.  No orders of the defined types were placed in this encounter.    Procedures: No procedures performed  Clinical Data: No additional findings.  ROS:  All other systems negative, except as noted in the HPI. Review of Systems  Objective: Vital Signs: There were no vitals taken for this visit.  Specialty Comments:  No specialty comments available.  PMFS History: Patient Active Problem List   Diagnosis Date Noted   PAD (peripheral artery disease) ([place]) 12/01/2022   Gangrene of toe of left foot ([place]) 11/28/2022   Diabetes mellitus type 2 in obese ([place]) 11/27/2022   GERD (gastroesophageal reflux disease) 11/27/2022   Flash pulmonary edema ([place]) 11/04/2022   Dysphagia 10/18/2022   Osteomyelitis of second toe of left foot ([place]) 11/13/2021   OSA (obstructive sleep apnea) 10/04/2021   Obesity hypoventilation syndrome ([place]) 10/04/2021   Organic impotence 08/16/2021   NSTEMI  (non-ST elevated myocardial infarction) ([place]) 05/17/2021   Acute gout of right knee 08/03/2019   Orthostasis    Pain    Physical debility 07/16/2019   ESRD (end stage renal disease) ([place])    Anemia of chronic disease    Uncontrolled type 2 diabetes mellitus with hyperglycemia (HCC)    Volume overload 07/05/2019   Anasarca 07/03/2019   Dyslipidemia 07/03/2019   Chronic combined systolic and diastolic CHF (congestive heart failure) ([place]) 07/03/2019   Bilateral leg edema    Hypokalemia    Thromboembolism ([place]) 02/20/2019   Diabetes mellitus type 2 in nonobese ([place]) 06/12/2012   Hyponatremia 06/12/2012   Microcytic anemia 06/12/2012   Essential hypertension, benign 06/11/2012   Elevated LFTs 06/11/2012   Morbid obesity ([place]) 06/11/2012   Hepatic steatosis 06/11/2012   Past Medical History:  Diagnosis Date   Acute diastolic CHF (congestive heart failure) ([place]) 06/11/2012   EF 50-55% [facility])   Chronic kidney disease    COPD (chronic obstructive pulmonary disease) ([place])    Diabetes mellitus    A1c 11.5 (06/11/2012).   Dyspnea    Gout    Hepatic steatosis 06/11/2012   Elevated LFTs   Hyperlipemia    Malignant hypertension    Microcytic anemia 06/12/2012   Obesity     Family History  Problem Relation Age of Onset   Gout Mother    Asthma Mother    Diabetes Father    Heart failure Father    Diabetes Sister    Hypertension Brother    Pancreatic cancer Brother    Diabetes Sister     Past Surgical History:  Procedure Laterality Date   ABDOMINAL AORTOGRAM W/LOWER EXTREMITY N/A 12/01/2022   Procedure: ABDOMINAL AORTOGRAM W/LOWER EXTREMITY;  Surgeon: Marty Heck, MD;  Location: [place] [facility];  Service: Cardiovascular;  Laterality: N/A;   AMPUTATION Left 12/03/2022   Procedure: LEFT SECOND TOE AMPUTATION;  Surgeon: Newt Minion, MD;  Location: [place];  Service: Orthopedics;  Laterality: Left;   AV FISTULA PLACEMENT Left 11/11/2019   Procedure: ARTERIOVENOUS  (AV) FISTULA CREATION LEFT BRACHIOCEPHALIC ARM;  Surgeon: Serafina Mitchell, MD;  Location: [place];  Service: Vascular;  Laterality: Left;   COLONOSCOPY WITH PROPOFOL N/A 09/02/2021   Procedure: COLONOSCOPY WITH PROPOFOL;  Surgeon: Jonathon Bellows, MD;  Location: [facility];  Service: Gastroenterology;  Laterality: N/A;   IR FLUORO GUIDE CV LINE RIGHT  07/08/2019   IR REMOVAL TUN CV CATH W/O FL  03/23/2020   IR  US GUIDE VASC ACCESS RIGHT  07/08/2019   LEFT HEART CATH AND CORONARY ANGIOGRAPHY N/A 06/12/2021   Procedure: LEFT HEART CATH AND CORONARY ANGIOGRAPHY;  Surgeon: Belva Crome, MD;  Location: [place] [facility];  Service: Cardiovascular;  Laterality: N/A;   NO PAST SURGERIES     PERIPHERAL VASCULAR BALLOON ANGIOPLASTY Left 12/01/2022   Procedure: PERIPHERAL VASCULAR BALLOON ANGIOPLASTY;  Surgeon: Marty Heck, MD;  Location: [place] [facility];  Service: Cardiovascular;  Laterality: Left;  Anterior tibial   PERIPHERAL VASCULAR INTERVENTION Left 12/01/2022   Procedure: PERIPHERAL VASCULAR INTERVENTION;  Surgeon: Marty Heck, MD;  Location: [place] [facility];  Service: Cardiovascular;  Laterality: Left;  sfa   VASCULAR SURGERY     Social History   Occupational History   Occupation: retired  Tobacco Use   Smoking status: Never    Passive exposure: Past   Smokeless tobacco: Never  Vaping Use   Vaping Use: Never used  Substance and Sexual Activity   Alcohol use: Not Currently    Comment: haven't drank in over 5 years    Drug use: No   Sexual activity: Not Currently

## 2023-01-21 ENCOUNTER — Other Ambulatory Visit (HOSPITAL_BASED_OUTPATIENT_CLINIC_OR_DEPARTMENT_OTHER): Payer: Self-pay

## 2023-01-21 DIAGNOSIS — G479 Sleep disorder, unspecified: Secondary | ICD-10-CM

## 2023-01-27 ENCOUNTER — Encounter: Payer: 59 | Admitting: Pulmonary Disease

## 2023-02-05 ENCOUNTER — Encounter: Payer: 59 | Admitting: Orthopedic Surgery

## 2023-02-11 ENCOUNTER — Encounter: Payer: Self-pay | Admitting: Family

## 2023-02-11 ENCOUNTER — Ambulatory Visit (INDEPENDENT_AMBULATORY_CARE_PROVIDER_SITE_OTHER): Payer: 59 | Admitting: Family

## 2023-02-11 DIAGNOSIS — I96 Gangrene, not elsewhere classified: Secondary | ICD-10-CM

## 2023-02-11 DIAGNOSIS — L98491 Non-pressure chronic ulcer of skin of other sites limited to breakdown of skin: Secondary | ICD-10-CM

## 2023-02-11 NOTE — Progress Notes (Signed)
Post-Op Visit Note   Patient: Kelly Key           Date of Birth: 11/21/1970           MRN: [id number] Visit Date: 02/11/2023 PCP: Pearson Grippe, MD  Chief Complaint:  Chief Complaint  Patient presents with   Left Foot - Routine Post Op    12/03/2022 left 2nd toe amputation     HPI:  HPI The patient is a 52 year old gentleman who presents today in follow-up status post second ray amputation of the left foot.  He is also concerned about some ischemic changes to the third and fourth toes  Ortho Exam On examination of the left foot the incision is well-healed.  There is no sausage digit swelling of the third or fourth toes no sign of infection or cellulitis.  He did have loose eschar over the PIP joint of the third toe as well as the tuft of the fourth toe thickened and discolored onychomycotic nails of the left foot these were trimmed today after informed consent.  The eschar was debrided.  There is underlying epithelialization.  No drainage.  Visit Diagnoses: No diagnosis found.  Plan: Continue close monitoring of the left foot.  May advance to regular shoewear.  Follow-Up Instructions: No follow-ups on file.   Imaging: No results found.  Orders:  No orders of the defined types were placed in this encounter.  No orders of the defined types were placed in this encounter.    PMFS History: Patient Active Problem List   Diagnosis Date Noted   PAD (peripheral artery disease) 12/01/2022   Gangrene of toe of left foot 11/28/2022   Diabetes mellitus type 2 in obese 11/27/2022   GERD (gastroesophageal reflux disease) 11/27/2022   Flash pulmonary edema 11/04/2022   Dysphagia 10/18/2022   Osteomyelitis of second toe of left foot 11/13/2021   OSA (obstructive sleep apnea) 10/04/2021   Obesity hypoventilation syndrome 10/04/2021   Organic impotence 08/16/2021   NSTEMI (non-ST elevated myocardial infarction) 05/17/2021   Acute gout of right knee 08/03/2019   Orthostasis    Pain     Physical debility 07/16/2019   ESRD (end stage renal disease)    Anemia of chronic disease    Uncontrolled type 2 diabetes mellitus with hyperglycemia    Volume overload 07/05/2019   Anasarca 07/03/2019   Dyslipidemia 07/03/2019   Chronic combined systolic and diastolic CHF (congestive heart failure) 07/03/2019   Bilateral leg edema    Hypokalemia    Thromboembolism 02/20/2019   Diabetes mellitus type 2 in nonobese 06/12/2012   Hyponatremia 06/12/2012   Microcytic anemia 06/12/2012   Essential hypertension, benign 06/11/2012   Elevated LFTs 06/11/2012   Morbid obesity (HCC) 06/11/2012   Hepatic steatosis 06/11/2012   Past Medical History:  Diagnosis Date   Acute diastolic CHF (congestive heart failure) (HCC) 06/11/2012   EF 50-55% [facility])   Chronic kidney disease    COPD (chronic obstructive pulmonary disease) (HCC)    Diabetes mellitus    A1c 11.5 (06/11/2012).   Dyspnea    Gout    Hepatic steatosis 06/11/2012   Elevated LFTs   Hyperlipemia    Malignant hypertension    Microcytic anemia 06/12/2012   Obesity     Family History  Problem Relation Age of Onset   Gout Mother    Asthma Mother    Diabetes Father    Heart failure Father    Diabetes Sister    Hypertension Brother  Pancreatic cancer Brother    Diabetes Sister     Past Surgical History:  Procedure Laterality Date   ABDOMINAL AORTOGRAM W/LOWER EXTREMITY N/A 12/01/2022   Procedure: ABDOMINAL AORTOGRAM W/LOWER EXTREMITY;  Surgeon: Cephus Shelling, MD;  [facility];  Service: Cardiovascular;  Laterality: N/A;   AMPUTATION Left 12/03/2022   Procedure: LEFT SECOND TOE AMPUTATION;  Surgeon: Nadara Mustard, MD;  Location[facility];  Service: Orthopedics;  Laterality: Left;   AV FISTULA PLACEMENT Left 11/11/2019   Procedure: ARTERIOVENOUS (AV) FISTULA CREATION LEFT BRACHIOCEPHALIC ARM;  Surgeon: Nada Libman, MD;  Location: [facility];  Service: Vascular;  Laterality: Left;   COLONOSCOPY  WITH PROPOFOL N/A 09/02/2021   Procedure: COLONOSCOPY WITH PROPOFOL;  Surgeon: Wyline Mood, MD;  Location: [facility];  Service: Gastroenterology;  Laterality: N/A;   IR FLUORO GUIDE CV LINE RIGHT  07/08/2019   IR REMOVAL TUN CV CATH W/O FL  03/23/2020   IR US GUIDE VASC ACCESS RIGHT  07/08/2019   LEFT HEART CATH AND CORONARY ANGIOGRAPHY N/A 06/12/2021   Procedure: LEFT HEART CATH AND CORONARY ANGIOGRAPHY;  Surgeon: Lyn Records, MD;  [facility];  Service: Cardiovascular;  Laterality: N/A;   NO PAST SURGERIES     PERIPHERAL VASCULAR BALLOON ANGIOPLASTY Left 12/01/2022   Procedure: PERIPHERAL VASCULAR BALLOON ANGIOPLASTY;  Surgeon: Cephus Shelling, MD;  Location: [facility];  Service: Cardiovascular;  Laterality: Left;  Anterior tibial   PERIPHERAL VASCULAR INTERVENTION Left 12/01/2022   Procedure: PERIPHERAL VASCULAR INTERVENTION;  Surgeon: Cephus Shelling, MD;  [facility]: [facility];  Service: Cardiovascular;  Laterality: Left;  sfa   VASCULAR SURGERY     Social History   Occupational History   Occupation: retired  Tobacco Use   Smoking status: Never    Passive exposure: Past   Smokeless tobacco: Never  Vaping Use   Vaping Use: Never used  Substance and Sexual Activity   Alcohol use: Not Currently    Comment: haven't drank in over 5 years    Drug use: No   Sexual activity: Not Currently

## 2023-03-06 ENCOUNTER — Encounter: Payer: 59 | Admitting: Pulmonary Disease

## 2023-03-10 ENCOUNTER — Encounter: Payer: 59 | Admitting: Pulmonary Disease

## 2023-03-13 ENCOUNTER — Ambulatory Visit: Payer: 59 | Attending: Adult Health | Admitting: Pulmonary Disease

## 2023-03-13 DIAGNOSIS — G4761 Periodic limb movement disorder: Secondary | ICD-10-CM | POA: Insufficient documentation

## 2023-03-13 DIAGNOSIS — G4733 Obstructive sleep apnea (adult) (pediatric): Secondary | ICD-10-CM | POA: Diagnosis not present

## 2023-03-13 DIAGNOSIS — G4736 Sleep related hypoventilation in conditions classified elsewhere: Secondary | ICD-10-CM | POA: Insufficient documentation

## 2023-03-13 DIAGNOSIS — G479 Sleep disorder, unspecified: Secondary | ICD-10-CM

## 2023-03-17 ENCOUNTER — Telehealth: Payer: Self-pay | Admitting: Pulmonary Disease

## 2023-03-17 DIAGNOSIS — G4709 Other insomnia: Secondary | ICD-10-CM

## 2023-03-17 DIAGNOSIS — G479 Sleep disorder, unspecified: Secondary | ICD-10-CM | POA: Diagnosis not present

## 2023-03-17 DIAGNOSIS — G4733 Obstructive sleep apnea (adult) (pediatric): Secondary | ICD-10-CM

## 2023-03-17 NOTE — Procedures (Signed)
Patient Name: Kelly Key, Kelly Key Date: 03/13/2023 Gender: Male D.O.B: 10/30/1970 Age (years): 89 Referring Provider: Vara Guardian NP Height (inches): 73 Interpreting Physician: Cyril Mourning MD, ABSM Weight (lbs): 300 RPSGT: Alfonso Ellis BMI: 40 MRN: [id number] Neck Size: 19.00 <br> <br> CLINICAL INFORMATION Sleep Study Type: NPSG    Indication for sleep study: excessive daytime somnolence, narrow pharyngeal exam, witnessed apneas & loud snoring    Epworth Sleepiness Score: 18    SLEEP STUDY TECHNIQUE As per the AASM Manual for the Scoring of Sleep and Associated Events v2.3 (April 2016) with a hypopnea requiring 4% desaturations.  The channels recorded and monitored were frontal, central and occipital EEG, electrooculogram (EOG), submentalis EMG (chin), nasal and oral airflow, thoracic and abdominal wall motion, anterior tibialis EMG, snore microphone, electrocardiogram, and pulse oximetry.  MEDICATIONS Medications self-administered by patient taken the night of the study : N/A  SLEEP ARCHITECTURE The study was initiated at 10:56:28 PM and ended at 4:55:51 AM.  Sleep onset time was 0.8 minutes and the sleep efficiency was 94.1%. The total sleep time was 338.1 minutes.  Stage REM latency was 113.5 minutes.  The patient spent 10.06% of the night in stage N1 sleep, 65.99% in stage N2 sleep, 11.39% in stage N3 and 12.6% in REM.  Alpha intrusion was absent.  Supine sleep was 3.55%.  RESPIRATORY PARAMETERS The overall apnea/hypopnea index (AHI) was 87.8 per hour. There were 102 total apneas, including 102 obstructive, 0 central and 0 mixed apneas. There were 393 hypopneas and 0 RERAs.  The AHI during Stage REM sleep was 76.2 per hour.  AHI while supine was 90.0 per hour.  The mean oxygen saturation was 89.61%. The minimum SpO2 during sleep was 70.00%.  loud snoring was noted during this study.  CARDIAC DATA The 2 lead EKG demonstrated sinus rhythm. The mean  heart rate was 20.67 beats per minute. Other EKG findings include: PVCs.   LEG MOVEMENT DATA The total PLMS were 520 with a resulting PLMS index of 92.27. Associated arousal with leg movement index was 12.2 .  IMPRESSIONS - Severe obstructive sleep apnea occurred during this study (AHI = 87.8/h). - Severe oxygen desaturation was noted during this study (Min O2 = 70.00%). - The patient snored with loud snoring volume. - EKG findings include PVCs. - Severe periodic limb movements of sleep occurred during the study. Associated arousals were significant. - DIAGNOSIS - Obstructive Sleep Apnea (G47.33) - Periodic Limb Movement During Sleep (G47.61) - Nocturnal Hypoxemia (G47.36)  RECOMMENDATIONS - Therapeutic CPAP titration to determine optimal pressure required to alleviate sleep disordered breathing. - Avoid alcohol, sedatives and other CNS depressants that may worsen sleep apnea and disrupt normal sleep architecture. - Sleep hygiene should be reviewed to assess factors that may improve sleep quality. - Weight management and regular exercise should be initiated or continued if appropriate.   Cyril Mourning MD Board Certified in Sleep medicine

## 2023-03-17 NOTE — Telephone Encounter (Signed)
Sleep study showed severe OSA with AHI 88/hour and low saturation of 70%. Schedule CPAP titration study and office visit for new consult, next available

## 2023-03-27 NOTE — Telephone Encounter (Signed)
Called and spoke w/ pt ordered CPAP titration and scheduled OV w/ RA for 8/16 @ 11am.

## 2023-05-05 ENCOUNTER — Encounter (HOSPITAL_COMMUNITY): Payer: Self-pay | Admitting: Family Medicine

## 2023-05-05 ENCOUNTER — Emergency Department (HOSPITAL_COMMUNITY): Payer: 59

## 2023-05-05 ENCOUNTER — Observation Stay (HOSPITAL_COMMUNITY)
Admission: EM | Admit: 2023-05-05 | Discharge: 2023-05-06 | Disposition: A | Discharge status: Home / Self Care | Payer: 59 | Attending: Family Medicine | Admitting: Family Medicine

## 2023-05-05 ENCOUNTER — Other Ambulatory Visit: Payer: Self-pay

## 2023-05-05 DIAGNOSIS — N186 End stage renal disease: Secondary | ICD-10-CM | POA: Diagnosis not present

## 2023-05-05 DIAGNOSIS — E875 Hyperkalemia: Secondary | ICD-10-CM | POA: Diagnosis present

## 2023-05-05 DIAGNOSIS — E1122 Type 2 diabetes mellitus with diabetic chronic kidney disease: Secondary | ICD-10-CM | POA: Insufficient documentation

## 2023-05-05 DIAGNOSIS — Z79899 Other long term (current) drug therapy: Secondary | ICD-10-CM | POA: Diagnosis not present

## 2023-05-05 DIAGNOSIS — J810 Acute pulmonary edema: Secondary | ICD-10-CM | POA: Insufficient documentation

## 2023-05-05 DIAGNOSIS — I5033 Acute on chronic diastolic (congestive) heart failure: Secondary | ICD-10-CM | POA: Diagnosis not present

## 2023-05-05 DIAGNOSIS — G4733 Obstructive sleep apnea (adult) (pediatric): Secondary | ICD-10-CM | POA: Diagnosis present

## 2023-05-05 DIAGNOSIS — Z794 Long term (current) use of insulin: Secondary | ICD-10-CM | POA: Diagnosis not present

## 2023-05-05 DIAGNOSIS — Z7982 Long term (current) use of aspirin: Secondary | ICD-10-CM | POA: Insufficient documentation

## 2023-05-05 DIAGNOSIS — K219 Gastro-esophageal reflux disease without esophagitis: Secondary | ICD-10-CM | POA: Diagnosis present

## 2023-05-05 DIAGNOSIS — I132 Hypertensive heart and chronic kidney disease with heart failure and with stage 5 chronic kidney disease, or end stage renal disease: Secondary | ICD-10-CM | POA: Diagnosis not present

## 2023-05-05 DIAGNOSIS — J449 Chronic obstructive pulmonary disease, unspecified: Secondary | ICD-10-CM | POA: Diagnosis not present

## 2023-05-05 DIAGNOSIS — E8770 Fluid overload, unspecified: Secondary | ICD-10-CM | POA: Diagnosis present

## 2023-05-05 DIAGNOSIS — E1165 Type 2 diabetes mellitus with hyperglycemia: Secondary | ICD-10-CM | POA: Diagnosis present

## 2023-05-05 DIAGNOSIS — E669 Obesity, unspecified: Secondary | ICD-10-CM | POA: Diagnosis present

## 2023-05-05 DIAGNOSIS — Z7722 Contact with and (suspected) exposure to environmental tobacco smoke (acute) (chronic): Secondary | ICD-10-CM | POA: Diagnosis not present

## 2023-05-05 DIAGNOSIS — Z992 Dependence on renal dialysis: Secondary | ICD-10-CM | POA: Insufficient documentation

## 2023-05-05 DIAGNOSIS — I10 Essential (primary) hypertension: Secondary | ICD-10-CM | POA: Diagnosis present

## 2023-05-05 DIAGNOSIS — R0602 Shortness of breath: Secondary | ICD-10-CM | POA: Diagnosis present

## 2023-05-05 LAB — CBC WITH DIFFERENTIAL/PLATELET
Abs Immature Granulocytes: 0.03 10*3/uL (ref 0.00–0.07)
Basophils Absolute: 0.1 10*3/uL (ref 0.0–0.1)
Basophils Relative: 1 %
Eosinophils Absolute: 0.5 10*3/uL (ref 0.0–0.5)
Eosinophils Relative: 6 %
HCT: 33.8 % — ABNORMAL LOW (ref 39.0–52.0)
Hemoglobin: 10.6 g/dL — ABNORMAL LOW (ref 13.0–17.0)
Immature Granulocytes: 0 %
Lymphocytes Relative: 21 %
Lymphs Abs: 1.7 10*3/uL (ref 0.7–4.0)
MCH: 28 pg (ref 26.0–34.0)
MCHC: 31.4 g/dL (ref 30.0–36.0)
MCV: 89.4 fL (ref 80.0–100.0)
Monocytes Absolute: 0.6 10*3/uL (ref 0.1–1.0)
Monocytes Relative: 7 %
Neutro Abs: 5.5 10*3/uL (ref 1.7–7.7)
Neutrophils Relative %: 65 %
Platelets: 236 10*3/uL (ref 150–400)
RBC: 3.78 MIL/uL — ABNORMAL LOW (ref 4.22–5.81)
RDW: 14.6 % (ref 11.5–15.5)
WBC: 8.4 10*3/uL (ref 4.0–10.5)
nRBC: 0 % (ref 0.0–0.2)

## 2023-05-05 LAB — BASIC METABOLIC PANEL
Anion gap: 16 — ABNORMAL HIGH (ref 5–15)
BUN: 90 mg/dL — ABNORMAL HIGH (ref 6–20)
CO2: 23 mmol/L (ref 22–32)
Calcium: 8.5 mg/dL — ABNORMAL LOW (ref 8.9–10.3)
Chloride: 92 mmol/L — ABNORMAL LOW (ref 98–111)
Creatinine, Ser: 12.54 mg/dL — ABNORMAL HIGH (ref 0.61–1.24)
GFR, Estimated: 4 mL/min — ABNORMAL LOW (ref >60)
Glucose, Bld: 298 mg/dL — ABNORMAL HIGH (ref 70–99)
Potassium: 6.3 mmol/L (ref 3.5–5.1)
Sodium: 131 mmol/L — ABNORMAL LOW (ref 135–145)

## 2023-05-05 LAB — HEPATITIS B SURFACE ANTIGEN: Hepatitis B Surface Ag: NONREACTIVE

## 2023-05-05 MED ORDER — DOXERCALCIFEROL 4 MCG/2ML IV SOLN
INTRAVENOUS | Status: AC
  Filled 2023-05-05: qty 2

## 2023-05-05 MED ORDER — RENA-VITE PO TABS
1.0000 | ORAL_TABLET | Freq: Every day | ORAL | Status: DC
  Administered 2023-05-05: 1 via ORAL
  Filled 2023-05-05: qty 1

## 2023-05-05 MED ORDER — ALBUTEROL SULFATE HFA 108 (90 BASE) MCG/ACT IN AERS: 2.0000 | INHALATION_SPRAY | RESPIRATORY_TRACT | Status: DC | PRN

## 2023-05-05 MED ORDER — ALLOPURINOL 100 MG PO TABS
200.0000 mg | ORAL_TABLET | Freq: Every day | ORAL | Status: DC
  Administered 2023-05-05 – 2023-05-06 (×2): 200 mg via ORAL
  Filled 2023-05-05 (×2): qty 2

## 2023-05-05 MED ORDER — CARVEDILOL 12.5 MG PO TABS
25.0000 mg | ORAL_TABLET | Freq: Two times a day (BID) | ORAL | Status: DC
  Administered 2023-05-05 – 2023-05-06 (×3): 25 mg via ORAL
  Filled 2023-05-05 (×3): qty 2

## 2023-05-05 MED ORDER — DEXTROSE 50 % IV SOLN
1.0000 | Freq: Once | INTRAVENOUS | Status: AC
  Administered 2023-05-05: 50 mL via INTRAVENOUS
  Filled 2023-05-05: qty 50

## 2023-05-05 MED ORDER — HEPARIN SODIUM (PORCINE) 5000 UNIT/ML IJ SOLN
5000.0000 [IU] | Freq: Three times a day (TID) | INTRAMUSCULAR | Status: DC
  Administered 2023-05-05 – 2023-05-06 (×2): 5000 [IU] via SUBCUTANEOUS
  Filled 2023-05-05 (×2): qty 1

## 2023-05-05 MED ORDER — SODIUM CHLORIDE 0.9 % IV SOLN: 50.0000 mg | INTRAVENOUS | Status: DC

## 2023-05-05 MED ORDER — PENTAFLUOROPROP-TETRAFLUOROETH EX AERO: 1.0000 | INHALATION_SPRAY | CUTANEOUS | Status: DC | PRN

## 2023-05-05 MED ORDER — ACETAMINOPHEN 325 MG PO TABS
650.0000 mg | ORAL_TABLET | Freq: Four times a day (QID) | ORAL | Status: DC | PRN
  Administered 2023-05-05: 650 mg via ORAL
  Filled 2023-05-05: qty 2

## 2023-05-05 MED ORDER — ASPIRIN 81 MG PO TBEC
81.0000 mg | DELAYED_RELEASE_TABLET | Freq: Every day | ORAL | Status: DC
  Administered 2023-05-05 – 2023-05-06 (×2): 81 mg via ORAL
  Filled 2023-05-05 (×2): qty 1

## 2023-05-05 MED ORDER — PANTOPRAZOLE SODIUM 40 MG PO TBEC
40.0000 mg | DELAYED_RELEASE_TABLET | Freq: Every day | ORAL | Status: DC
  Administered 2023-05-05 – 2023-05-06 (×2): 40 mg via ORAL
  Filled 2023-05-05 (×2): qty 1

## 2023-05-05 MED ORDER — ACETAMINOPHEN 650 MG RE SUPP: 650.0000 mg | Freq: Four times a day (QID) | RECTAL | Status: DC | PRN

## 2023-05-05 MED ORDER — EZETIMIBE 10 MG PO TABS
10.0000 mg | ORAL_TABLET | Freq: Every evening | ORAL | Status: DC
  Administered 2023-05-05: 10 mg via ORAL
  Filled 2023-05-05: qty 1

## 2023-05-05 MED ORDER — AMLODIPINE BESYLATE 5 MG PO TABS
5.0000 mg | ORAL_TABLET | Freq: Every day | ORAL | Status: DC
  Administered 2023-05-05 – 2023-05-06 (×2): 5 mg via ORAL
  Filled 2023-05-05 (×2): qty 1

## 2023-05-05 MED ORDER — SEVELAMER CARBONATE 800 MG PO TABS
ORAL_TABLET | ORAL | Status: AC
  Filled 2023-05-05: qty 3

## 2023-05-05 MED ORDER — GABAPENTIN 100 MG PO CAPS
100.0000 mg | ORAL_CAPSULE | Freq: Three times a day (TID) | ORAL | Status: DC
  Administered 2023-05-05 – 2023-05-06 (×4): 100 mg via ORAL
  Filled 2023-05-05 (×4): qty 1

## 2023-05-05 MED ORDER — ATORVASTATIN CALCIUM 40 MG PO TABS
80.0000 mg | ORAL_TABLET | Freq: Every day | ORAL | Status: DC
  Administered 2023-05-05 – 2023-05-06 (×2): 80 mg via ORAL
  Filled 2023-05-05 (×2): qty 2

## 2023-05-05 MED ORDER — ALBUTEROL SULFATE (2.5 MG/3ML) 0.083% IN NEBU: 2.5000 mg | INHALATION_SOLUTION | RESPIRATORY_TRACT | Status: DC | PRN

## 2023-05-05 MED ORDER — NITROGLYCERIN 2 % TD OINT
1.0000 [in_us] | TOPICAL_OINTMENT | Freq: Once | TRANSDERMAL | Status: AC
  Administered 2023-05-05: 1 [in_us] via TOPICAL
  Filled 2023-05-05: qty 1

## 2023-05-05 MED ORDER — SEVELAMER CARBONATE 800 MG PO TABS
2400.0000 mg | ORAL_TABLET | Freq: Three times a day (TID) | ORAL | Status: DC
  Administered 2023-05-05 – 2023-05-06 (×5): 2400 mg via ORAL
  Filled 2023-05-05 (×5): qty 3

## 2023-05-05 MED ORDER — INSULIN ASPART 100 UNIT/ML IV SOLN
5.0000 [IU] | Freq: Once | INTRAVENOUS | Status: AC
  Administered 2023-05-05: 5 [IU] via INTRAVENOUS

## 2023-05-05 MED ORDER — LIDOCAINE HCL (PF) 1 % IJ SOLN: 5.0000 mL | INTRAMUSCULAR | Status: DC | PRN

## 2023-05-05 MED ORDER — CLONIDINE HCL 0.1 MG PO TABS
0.3000 mg | ORAL_TABLET | Freq: Three times a day (TID) | ORAL | Status: DC
  Administered 2023-05-05 – 2023-05-06 (×4): 0.3 mg via ORAL
  Filled 2023-05-05 (×3): qty 3
  Filled 2023-05-05: qty 1

## 2023-05-05 MED ORDER — CALCITRIOL 0.5 MCG PO CAPS
2.7500 ug | ORAL_CAPSULE | ORAL | Status: DC
  Filled 2023-05-05: qty 1

## 2023-05-05 MED ORDER — ONDANSETRON HCL 4 MG PO TABS: 4.0000 mg | ORAL_TABLET | Freq: Four times a day (QID) | ORAL | Status: DC | PRN

## 2023-05-05 MED ORDER — HEPARIN SODIUM (PORCINE) 1000 UNIT/ML DIALYSIS
100.0000 [IU]/kg | INTRAMUSCULAR | Status: DC | PRN
  Administered 2023-05-05: 3000 [IU] via INTRAVENOUS_CENTRAL
  Filled 2023-05-05: qty 15

## 2023-05-05 MED ORDER — FLUTICASONE PROPIONATE 50 MCG/ACT NA SUSP: 1.0000 | Freq: Every day | NASAL | Status: DC | PRN

## 2023-05-05 MED ORDER — HEPARIN SODIUM (PORCINE) 1000 UNIT/ML IJ SOLN
INTRAMUSCULAR | Status: AC
  Filled 2023-05-05: qty 9

## 2023-05-05 MED ORDER — FUROSEMIDE 10 MG/ML IJ SOLN
40.0000 mg | Freq: Once | INTRAMUSCULAR | Status: AC
  Administered 2023-05-05: 40 mg via INTRAVENOUS
  Filled 2023-05-05: qty 4

## 2023-05-05 MED ORDER — ONDANSETRON HCL 4 MG/2ML IJ SOLN: 4.0000 mg | Freq: Four times a day (QID) | INTRAMUSCULAR | Status: DC | PRN

## 2023-05-05 MED ORDER — CHLORHEXIDINE GLUCONATE CLOTH 2 % EX PADS
6.0000 | MEDICATED_PAD | Freq: Every day | CUTANEOUS | Status: DC
  Administered 2023-05-06: 6 via TOPICAL

## 2023-05-05 MED ORDER — SODIUM ZIRCONIUM CYCLOSILICATE 5 G PO PACK
10.0000 g | PACK | Freq: Once | ORAL | Status: AC
  Administered 2023-05-05: 10 g via ORAL
  Filled 2023-05-05: qty 2

## 2023-05-05 MED ORDER — DOXERCALCIFEROL 4 MCG/2ML IV SOLN
2.0000 ug | INTRAVENOUS | Status: DC
  Administered 2023-05-05: 2 ug via INTRAVENOUS
  Filled 2023-05-05: qty 2

## 2023-05-05 MED ORDER — LIDOCAINE-PRILOCAINE 2.5-2.5 % EX CREA: 1.0000 | TOPICAL_CREAM | CUTANEOUS | Status: DC | PRN

## 2023-05-05 NOTE — ED Triage Notes (Signed)
Pt c/o SOB that began last night, is scheduled to have dialysis this morning. Reports bilateral lower extremity swelling.

## 2023-05-05 NOTE — Assessment & Plan Note (Addendum)
-  Nephrology service has been consulted for continuation of dialysis and further assistance with management of electrolyte abnormalities -Follow electrolytes trend and clinical response. -Renal diet recommended.

## 2023-05-05 NOTE — Assessment & Plan Note (Signed)
-  Continue the use of CPAP nightly ?

## 2023-05-05 NOTE — Consult Note (Signed)
[facility] Renal Consultation Note    Indication for Consultation:  Management of ESRD/hemodialysis; anemia, hypertension/volume and secondary hyperparathyroidism  HPI: Kelly Key is a 52 y.o. male with a history of ESRD (TTS at [facility]), chronic diastolic CHF, COPD, DM, gout, HTN, and obesity who presented to the [facility] ED early this morning c/o SOB, orthopnea, and lower extremity edema.  In the ED, temp 97.5, Bp 227/108, HR 84, SpO2 96%.  Labs notable for Na 131, K 6.3, Cl 92, BUN 90 Cr 12.54, Hgb 10.6.  CXR with vascular congestion and interstitial edema c/w fluid overload.  He was admitted with hypertensive urgency and CHF.  We were consulted to provide dialysis during his admission.   Past Medical History:  Diagnosis Date   Acute diastolic CHF (congestive heart failure) (HCC) 06/11/2012   EF 50-55% [facility])   Chronic kidney disease    COPD (chronic obstructive pulmonary disease) (HCC)    Diabetes mellitus    A1c 11.5 (06/11/2012).   Dyspnea    Gout    Hepatic steatosis 06/11/2012   Elevated LFTs   Hyperlipemia    Malignant hypertension    Microcytic anemia 06/12/2012   Obesity    Past Surgical History:  Procedure Laterality Date   ABDOMINAL AORTOGRAM W/LOWER EXTREMITY N/A 12/01/2022   Procedure: ABDOMINAL AORTOGRAM W/LOWER EXTREMITY;  Surgeon: Cephus Shelling, MD;  Location: [facility] INVASIVE CV LAB;  Service: Cardiovascular;  Laterality: N/A;   AMPUTATION Left 12/03/2022   Procedure: LEFT SECOND TOE AMPUTATION;  Surgeon: Nadara Mustard, MD;  Location: [facility] OR;  Service: Orthopedics;  Laterality: Left;   AV FISTULA PLACEMENT Left 11/11/2019   Procedure: ARTERIOVENOUS (AV) FISTULA CREATION LEFT BRACHIOCEPHALIC ARM;  Surgeon: Nada Libman, MD;  Location: [facility] OR;  Service: Vascular;  Laterality: Left;   COLONOSCOPY WITH PROPOFOL N/A 09/02/2021   Procedure: COLONOSCOPY WITH PROPOFOL;  Surgeon: Wyline Mood, MD;  Location: [facility] ENDOSCOPY;  Service:  Gastroenterology;  Laterality: N/A;   IR FLUORO GUIDE CV LINE RIGHT  07/08/2019   IR REMOVAL TUN CV CATH W/O FL  03/23/2020   IR US GUIDE VASC ACCESS RIGHT  07/08/2019   LEFT HEART CATH AND CORONARY ANGIOGRAPHY N/A 06/12/2021   Procedure: LEFT HEART CATH AND CORONARY ANGIOGRAPHY;  Surgeon: Lyn Records, MD;  Location: [facility] INVASIVE CV LAB;  Service: Cardiovascular;  Laterality: N/A;   NO PAST SURGERIES     PERIPHERAL VASCULAR BALLOON ANGIOPLASTY Left 12/01/2022   Procedure: PERIPHERAL VASCULAR BALLOON ANGIOPLASTY;  Surgeon: Cephus Shelling, MD;  Location: [facility] INVASIVE CV LAB;  Service: Cardiovascular;  Laterality: Left;  Anterior tibial   PERIPHERAL VASCULAR INTERVENTION Left 12/01/2022   Procedure: PERIPHERAL VASCULAR INTERVENTION;  Surgeon: Cephus Shelling, MD;  Location: [facility] INVASIVE CV LAB;  Service: Cardiovascular;  Laterality: Left;  sfa   VASCULAR SURGERY     Family History:   Family History  Problem Relation Age of Onset   Gout Mother    Asthma Mother    Diabetes Father    Heart failure Father    Diabetes Sister    Hypertension Brother    Pancreatic cancer Brother    Diabetes Sister    Social History:  reports that he has never smoked. He has been exposed to tobacco smoke. He has never used smokeless tobacco. He reports that he does not currently use alcohol. He reports that he does not use drugs. Allergies  Allergen Reactions   Ozempic (0.25 Or 0.5 Mg-Dose) [Semaglutide(0.25 Or 0.5mg -Dos)]  Shortness Of Breath   Hydralazine Anxiety    Hallucinations    Lisinopril Swelling   Prior to Admission medications   Medication Sig Start Date End Date Taking? Authorizing Provider  acetaminophen (TYLENOL) 500 MG tablet Take 1,000 mg by mouth every 6 (six) hours as needed for moderate pain or headache.   Yes [provider]  albuterol (VENTOLIN HFA) 108 (90 Base) MCG/ACT inhaler Inhale 2 puffs into the lungs every 4 (four) hours as needed for wheezing or shortness of breath.   03/24/19  Yes [provider]  allopurinol (ZYLOPRIM) 100 MG tablet Take 2 tablets (200 mg total) by mouth daily. 08/04/19  Yes Love, Evlyn Kanner, PA-C  amLODipine (NORVASC) 5 MG tablet Take 5 mg by mouth daily. 01/30/22  Yes [provider]  aspirin EC 81 MG EC tablet Take 1 tablet (81 mg total) by mouth daily. 08/04/19  Yes Love, Evlyn Kanner, PA-C  atorvastatin (LIPITOR) 80 MG tablet Take 1 tablet (80 mg total) by mouth daily. 12/05/19  Yes Branch, Dorothe Pea, MD  carvedilol (COREG) 25 MG tablet Take 1 tablet (25 mg total) by mouth 2 (two) times daily with a meal. 08/03/19  Yes Love, Evlyn Kanner, PA-C  cloNIDine (CATAPRES) 0.3 MG tablet Take 0.3 mg by mouth 3 (three) times daily.   Yes [provider]  esomeprazole (NEXIUM) 20 MG capsule Take 20 mg by mouth daily. 01/23/22  Yes [provider]  ezetimibe (ZETIA) 10 MG tablet Take 10 mg by mouth every evening. 01/20/19  Yes [provider]  fluticasone (FLONASE) 50 MCG/ACT nasal spray Place 1 spray into both nostrils daily as needed for allergies or rhinitis.   Yes [provider]  furosemide (LASIX) 80 MG tablet Take 80 mg by mouth daily.   Yes [provider]  gabapentin (NEURONTIN) 100 MG capsule Take 1 capsule (100 mg total) by mouth 3 (three) times daily. 08/03/19  Yes Love, Evlyn Kanner, PA-C  HUMALOG KWIKPEN 100 UNIT/ML KwikPen Inject 8-14 Units into the skin 3 (three) times daily. Sliding scale 30 units TID 08/19/22  Yes [provider]  Ipratropium-Albuterol (COMBIVENT) 20-100 MCG/ACT AERS respimat Inhale 1 puff into the lungs every 6 (six) hours as needed for wheezing or shortness of breath. 11/05/22 05/05/23 Yes Shah, Pratik D, DO  LEVEMIR FLEXTOUCH 100 UNIT/ML FlexTouch Pen INJECT 50 UNITS SUBCUTANEOUSLY AT BEDTIME Patient taking differently: Inject 50 Units into the skin at bedtime. 07/09/21  Yes Dani Gobble, NP  linaclotide (LINZESS) 145 MCG CAPS capsule Take 145 mcg by mouth daily  before breakfast.   Yes [provider]  multivitamin (RENA-VIT) TABS tablet Take 1 tablet by mouth at bedtime. 08/03/19  Yes Love, Evlyn Kanner, PA-C  sevelamer carbonate (RENVELA) 800 MG tablet Take 3 tablets (2,400 mg total) by mouth 3 (three) times daily with meals. Patient taking differently: Take 2,400 mg by mouth 3 (three) times daily with meals. And once daily with snacks 08/03/19  Yes Love, Evlyn Kanner, PA-C  AMBULATORY NON FORMULARY MEDICATION Inject 0.2 ml by intracavernosal route as directed.  Medication Name: TriMix PGE 10 mcg Pap   30 mg Phent 1 mg Patient not taking: Reported on 05/05/2023 01/01/22   Milderd Meager., MD  glucose blood (ACCU-CHEK AVIVA PLUS) test strip Use as instructed TO CHECK BLOOD GLUCOSE THREE TIMES DAILY 08/15/20   Nida, Denman George, MD  Insulin Pen Needle (B-D ULTRAFINE III SHORT PEN) 31G X 8 MM MISC 1 each by Does not apply  route as directed. 08/13/20   Roma Kayser, MD   Current Facility-Administered Medications  Medication Dose Route Frequency Provider Last Rate Last Admin   0.9 %  sodium chloride infusion   Intravenous PRN Carroll Sage, PA-C       acetaminophen (TYLENOL) tablet 650 mg  650 mg Oral Q6H PRN Vassie Loll, MD       Or   acetaminophen (TYLENOL) suppository 650 mg  650 mg Rectal Q6H PRN Vassie Loll, MD       albuterol (PROVENTIL) (2.5 MG/3ML) 0.083% nebulizer solution 2.5 mg  2.5 mg Nebulization Q4H PRN Zierle-Ghosh, Asia B, DO       allopurinol (ZYLOPRIM) tablet 200 mg  200 mg Oral Daily Vassie Loll, MD   200 mg at 05/05/23 0932   amLODipine (NORVASC) tablet 5 mg  5 mg Oral Daily Vassie Loll, MD   5 mg at 05/05/23 1610   aspirin EC tablet 81 mg  81 mg Oral Daily Vassie Loll, MD   81 mg at 05/05/23 0935   atorvastatin (LIPITOR) tablet 80 mg  80 mg Oral Daily Vassie Loll, MD   80 mg at 05/05/23 0932   calcitRIOL (ROCALTROL) capsule 2.75 mcg  2.75 mcg Oral Q T,Th,Sa-HD Anthony Sar, MD       carvedilol  (COREG) tablet 25 mg  25 mg Oral BID WC Vassie Loll, MD   25 mg at 05/05/23 9604   Chlorhexidine Gluconate Cloth 2 % PADS 6 each  6 each Topical Q0600 Anthony Sar, MD       cloNIDine (CATAPRES) tablet 0.3 mg  0.3 mg Oral TID Vassie Loll, MD   0.3 mg at 05/05/23 5409   ezetimibe (ZETIA) tablet 10 mg  10 mg Oral QPM Vassie Loll, MD       fluticasone (FLONASE) 50 MCG/ACT nasal spray 1 spray  1 spray Each Nare Daily PRN Vassie Loll, MD       gabapentin (NEURONTIN) capsule 100 mg  100 mg Oral TID Vassie Loll, MD   100 mg at 05/05/23 0932   heparin injection 14,900 Units  100 Units/kg Dialysis PRN Anthony Sar, MD       heparin injection 5,000 Units  5,000 Units Subcutaneous Q8H Vassie Loll, MD       lidocaine (PF) (XYLOCAINE) 1 % injection 5 mL  5 mL Intradermal PRN Anthony Sar, MD       lidocaine-prilocaine (EMLA) cream 1 Application  1 Application Topical PRN Anthony Sar, MD       multivitamin (RENA-VIT) tablet 1 tablet  1 tablet Oral QHS Vassie Loll, MD       ondansetron [facility]) tablet 4 mg  4 mg Oral Q6H PRN Vassie Loll, MD       Or   ondansetron [facility]) injection 4 mg  4 mg Intravenous Q6H PRN Vassie Loll, MD       pantoprazole (PROTONIX) EC tablet 40 mg  40 mg Oral Daily Vassie Loll, MD   40 mg at 05/05/23 8119   pentafluoroprop-tetrafluoroeth (GEBAUERS) aerosol 1 Application  1 Application Topical PRN Anthony Sar, MD       sevelamer carbonate (RENVELA) tablet 2,400 mg  2,400 mg Oral TID WC Vassie Loll, MD   2,400 mg at 05/05/23 1478   Current Outpatient Medications  Medication Sig Dispense Refill   acetaminophen (TYLENOL) 500 MG tablet Take 1,000 mg by mouth every 6 (six) hours as needed for moderate pain or headache.     albuterol (VENTOLIN HFA) 108 (  90 Base) MCG/ACT inhaler Inhale 2 puffs into the lungs every 4 (four) hours as needed for wheezing or shortness of breath.      allopurinol (ZYLOPRIM) 100 MG tablet Take 2 tablets (200 mg total) by mouth  daily. 30 tablet 0   amLODipine (NORVASC) 5 MG tablet Take 5 mg by mouth daily.     aspirin EC 81 MG EC tablet Take 1 tablet (81 mg total) by mouth daily. 30 tablet 0   atorvastatin (LIPITOR) 80 MG tablet Take 1 tablet (80 mg total) by mouth daily. 90 tablet 3   carvedilol (COREG) 25 MG tablet Take 1 tablet (25 mg total) by mouth 2 (two) times daily with a meal. 60 tablet 0   cloNIDine (CATAPRES) 0.3 MG tablet Take 0.3 mg by mouth 3 (three) times daily.     esomeprazole (NEXIUM) 20 MG capsule Take 20 mg by mouth daily.     ezetimibe (ZETIA) 10 MG tablet Take 10 mg by mouth every evening.     fluticasone (FLONASE) 50 MCG/ACT nasal spray Place 1 spray into both nostrils daily as needed for allergies or rhinitis.     furosemide (LASIX) 80 MG tablet Take 80 mg by mouth daily.     gabapentin (NEURONTIN) 100 MG capsule Take 1 capsule (100 mg total) by mouth 3 (three) times daily. 90 capsule 0   HUMALOG KWIKPEN 100 UNIT/ML KwikPen Inject 8-14 Units into the skin 3 (three) times daily. Sliding scale 30 units TID     Ipratropium-Albuterol (COMBIVENT) 20-100 MCG/ACT AERS respimat Inhale 1 puff into the lungs every 6 (six) hours as needed for wheezing or shortness of breath. 1 each 0   LEVEMIR FLEXTOUCH 100 UNIT/ML FlexTouch Pen INJECT 50 UNITS SUBCUTANEOUSLY AT BEDTIME (Patient taking differently: Inject 50 Units into the skin at bedtime.) 15 mL 0   linaclotide (LINZESS) 145 MCG CAPS capsule Take 145 mcg by mouth daily before breakfast.     multivitamin (RENA-VIT) TABS tablet Take 1 tablet by mouth at bedtime. 30 tablet 0   sevelamer carbonate (RENVELA) 800 MG tablet Take 3 tablets (2,400 mg total) by mouth 3 (three) times daily with meals. (Patient taking differently: Take 2,400 mg by mouth 3 (three) times daily with meals. And once daily with snacks) 270 tablet 0   AMBULATORY NON FORMULARY MEDICATION Inject 0.2 ml by intracavernosal route as directed.  Medication Name: TriMix PGE 10 mcg Pap   30  mg Phent 1 mg (Patient not taking: Reported on 05/05/2023) 5 mL 0   glucose blood (ACCU-CHEK AVIVA PLUS) test strip Use as instructed TO CHECK BLOOD GLUCOSE THREE TIMES DAILY 200 each 3   Insulin Pen Needle (B-D ULTRAFINE III SHORT PEN) 31G X 8 MM MISC 1 each by Does not apply route as directed. 100 each 3   Labs: Basic Metabolic Panel: Recent Labs  Lab 05/05/23 0523  NA 131*  K 6.3*  CL 92*  CO2 23  GLUCOSE 298*  BUN 90*  CREATININE 12.54*  CALCIUM 8.5*   Liver Function Tests: No results for input(s): "AST", "ALT", "ALKPHOS", "BILITOT", "PROT", "ALBUMIN" in the last 168 hours. No results for input(s): "LIPASE", "AMYLASE" in the last 168 hours. No results for input(s): "AMMONIA" in the last 168 hours. CBC: Recent Labs  Lab 05/05/23 0523  WBC 8.4  NEUTROABS 5.5  HGB 10.6*  HCT 33.8*  MCV 89.4  PLT 236   Cardiac Enzymes: No results for input(s): "CKTOTAL", "CKMB", "CKMBINDEX", "TROPONINI" in the last 168 hours. CBG:  No results for input(s): "GLUCAP" in the last 168 hours. Iron Studies: No results for input(s): "IRON", "TIBC", "TRANSFERRIN", "FERRITIN" in the last 72 hours. Studies/Results: DG Chest Portable 1 View  Result Date: 05/05/2023 CLINICAL DATA:  Shortness of breath and lower extremity edema. EXAM: PORTABLE CHEST 1 VIEW COMPARISON:  Portable chest 01/11/2023. FINDINGS: There is cardiomegaly with mild perihilar vascular congestion, mild interstitial edema noted new in the interval. There are minimal pleural effusions beginning to form. No acute airspace infiltrate is seen. The mediastinum is normally outlined. There is minimal aortic atherosclerosis. Thoracic spondylosis. IMPRESSION: Cardiomegaly with mild perihilar vascular congestion, mild interstitial edema and minimal pleural effusions consistent with CHF or fluid overload. Electronically Signed   By: Almira Bar M.D.   On: 05/05/2023 04:45    ROS: Pertinent items are noted in HPI. Physical Exam: Vitals:    05/05/23 0800 05/05/23 0833 05/05/23 0916 05/05/23 0933  BP: (!) 187/95   (!) 175/110  Pulse: 80     Resp: 18     Temp:  (!) 97.5 F (36.4 C)    TempSrc:  Oral    SpO2: 95%     Weight:   (!) 149 kg   Height:   5\' 6"  (1.676 m)       Weight change:  No intake or output data in the 24 hours ending 05/05/23 1020 BP (!) 175/110   Pulse 80   Temp (!) 97.5 F (36.4 C) (Oral)   Resp 18   Ht 5\' 6"  (1.676 m)   Wt (!) 149 kg   SpO2 95%   BMI 53.04 kg/m  General appearance: alert, cooperative, and no distress Head: Normocephalic, without obvious abnormality, atraumatic Resp: clear to auscultation bilaterally Cardio: regular rate and rhythm, S1, S2 normal, no murmur, click, rub or gallop GI: soft, non-tender; bowel sounds normal; no masses,  no organomegaly Extremities: edema trace pretibial edema BLE and LUE AVF +T/B 2 areas of aneurysmal changes, no breakdown Dialysis Access: LUE AVF  Outpatient orders: [facility].  TTS.  4 hours 15 minutes.  LUE AVF Flow rates: 450/500.  3K, 2.5 calcium.  EDW 142 kg.  Meds: Calcitriol 2.75 mcg q. treatment, Venofer 50 mg once weekly (due today), Mircera 60 mcg every 2 weeks (last dose last week). Heparin 2600 units load, 1000 units/hr   Assessment/Plan:  Hyperkalemia - treated medically and plan for urgent HD this morning.  ESRD -  Plan for HD today.  Hypertension/volume  -  markedly elevated.  Plan for HD today and UF as tolerated. Discussed the need for fluid restriction.  He admits that they did not get him to his EDW on Saturday.  He is currently 7 kg above edw and may require an extra HD session if census permits.   Anemia  - stable Hgb, no ESA for now  Metabolic bone disease -   continue with home meds  Nutrition -  renal diet, carb modified  Irena Cords, MD [facility], [facility] 05/05/2023, 10:20 AM

## 2023-05-05 NOTE — Assessment & Plan Note (Signed)
-   Continue the use of PPI. °

## 2023-05-05 NOTE — Assessment & Plan Note (Signed)
>>  ASSESSMENT AND PLAN FOR ESRD (END STAGE RENAL DISEASE) (HCC) WRITTEN ON 05/05/2023  8:44 AM BY MADERA, CARLOS, MD  -Nephrology service has been consulted for continuation of dialysis and further assistance with management of electrolyte abnormalities -Follow electrolytes trend and clinical response. -Renal diet recommended.

## 2023-05-05 NOTE — Assessment & Plan Note (Signed)
-  Most recent echo with ejection fraction 60-65% -Volume management with hemodialysis -Follow daily weights and strict I's and O's -Fluid restriction to 2 L daily has been recommended/ordered. -Low-sodium diet education provided to patient.

## 2023-05-05 NOTE — Progress Notes (Signed)
Informed of patient in ER. Discussed with ER MD. P/w SOB, swelling. K 6.3 here. HD [facility] TTS. Will plan for HD this AM with standard heparin. To receive med mgmt for hyperK. Discussed with outpatient HD unit. Typically has high IDWG due to dietary noncompliance. Difficult to reach EDW. Outpatient orders: [facility].  TTS.  4 hours 15 minutes.  Flow rates: 450/500.  3K, 2.5 calcium.  EDW 142 kg.  Meds: Calcitriol 2.75 mcg q. treatment, Venofer 50 mg once weekly (due today), Mircera 60 mcg every 2 weeks (last dose last week). Heparin 2600 units load, 1000 units/hr Full consult note to follow.  Anthony Sar, MD [facility]

## 2023-05-05 NOTE — Assessment & Plan Note (Signed)
-  Most recent A1c 11.6 -Will update A1c level -Sliding scale insulin and the use of cyclic will be provided -Follow CBGs fluctuation -Modified carbohydrate diet discussed with patient.

## 2023-05-05 NOTE — Progress Notes (Signed)
ESRD on HD. Dyspnea and hypertensive. Short of breath. Edema on cxr. K+ elevated. He was given nitroglycerin paste for hypertension. No missed HD sessions. Nephro already notified. Admit for BP control, HD, and elevated K.

## 2023-05-05 NOTE — ED Provider Notes (Signed)
EMERGENCY DEPARTMENT AT [facility] Provider Note   CSN: [id number] Arrival date & time: 05/05/23  5784     History  No chief complaint on file.   Kelly Key is a 52 y.o. male.  The history is provided by the patient.  Patient history of CHF, diabetes, obesity, end-stage renal disease presents with shortness of breath. Patient reports his last dialysis session was on July 6.  He reports beginning last night he started having increasing shortness of breath and orthopnea.  He also reports increased lower extremity swelling.  No chest pain, no fevers or vomiting. He is due for dialysis in 1 hour   Past Medical History:  Diagnosis Date   Acute diastolic CHF (congestive heart failure) (HCC) 06/11/2012   EF 50-55% [facility])   Chronic kidney disease    COPD (chronic obstructive pulmonary disease) (HCC)    Diabetes mellitus    A1c 11.5 (06/11/2012).   Dyspnea    Gout    Hepatic steatosis 06/11/2012   Elevated LFTs   Hyperlipemia    Malignant hypertension    Microcytic anemia 06/12/2012   Obesity     Home Medications Prior to Admission medications   Medication Sig Start Date End Date Taking? Authorizing Provider  acetaminophen (TYLENOL) 500 MG tablet Take 1,000 mg by mouth every 6 (six) hours as needed for moderate pain or headache.    [provider]  albuterol (VENTOLIN HFA) 108 (90 Base) MCG/ACT inhaler Inhale 2 puffs into the lungs every 4 (four) hours as needed for wheezing or shortness of breath.  03/24/19   [provider]  allopurinol (ZYLOPRIM) 100 MG tablet Take 2 tablets (200 mg total) by mouth daily. 08/04/19   Love, Evlyn Kanner, PA-C  AMBULATORY NON FORMULARY MEDICATION Inject 0.2 ml by intracavernosal route as directed.  Medication Name: TriMix PGE 10 mcg Pap   30 mg Phent 1 mg 01/01/22   Stoneking, Danford Bad., MD  amLODipine (NORVASC) 5 MG tablet Take 5 mg by mouth daily. 01/30/22   [provider]  aspirin EC 81  MG EC tablet Take 1 tablet (81 mg total) by mouth daily. 08/04/19   Love, Evlyn Kanner, PA-C  atorvastatin (LIPITOR) 80 MG tablet Take 1 tablet (80 mg total) by mouth daily. 12/05/19   Antoine Poche, MD  carvedilol (COREG) 25 MG tablet Take 1 tablet (25 mg total) by mouth 2 (two) times daily with a meal. 08/03/19   Love, Evlyn Kanner, PA-C  cloNIDine (CATAPRES) 0.3 MG tablet Take 0.3 mg by mouth 3 (three) times daily.    [provider]  esomeprazole (NEXIUM) 20 MG capsule Take 20 mg by mouth daily. 01/23/22   [provider]  ezetimibe (ZETIA) 10 MG tablet Take 10 mg by mouth every evening. 01/20/19   [provider]  fluticasone (FLONASE) 50 MCG/ACT nasal spray Place 1 spray into both nostrils daily as needed for allergies or rhinitis.    [provider]  gabapentin (NEURONTIN) 100 MG capsule Take 1 capsule (100 mg total) by mouth 3 (three) times daily. 08/03/19   Love, Evlyn Kanner, PA-C  glucose blood (ACCU-CHEK AVIVA PLUS) test strip Use as instructed TO CHECK BLOOD GLUCOSE THREE TIMES DAILY 08/15/20   Nida, Denman George, MD  HUMALOG KWIKPEN 100 UNIT/ML KwikPen Inject 8-14 Units into the skin 3 (three) times daily. 08/19/22   [provider]  Insulin Pen Needle (B-D ULTRAFINE III SHORT PEN) 31G X 8 MM MISC 1  each by Does not apply route as directed. 08/13/20   Roma Kayser, MD  Ipratropium-Albuterol (COMBIVENT) 20-100 MCG/ACT AERS respimat Inhale 1 puff into the lungs every 6 (six) hours as needed for wheezing or shortness of breath. 11/05/22 01/12/23  Sherryll Burger, Pratik D, DO  LEVEMIR FLEXTOUCH 100 UNIT/ML FlexTouch Pen INJECT 50 UNITS SUBCUTANEOUSLY AT BEDTIME Patient taking differently: Inject 50 Units into the skin at bedtime. 07/09/21   Dani Gobble, NP  multivitamin (RENA-VIT) TABS tablet Take 1 tablet by mouth at bedtime. 08/03/19   Love, Evlyn Kanner, PA-C  OZEMPIC, 1 MG/DOSE, 4 MG/3ML SOPN Inject 1 mg into the skin once a week. Fridays.    [provider]  sevelamer carbonate (RENVELA) 800 MG tablet Take 3 tablets (2,400 mg total) by mouth 3 (three) times daily with meals. 08/03/19   Love, Evlyn Kanner, PA-C      Allergies    Ozempic (0.25 or 0.5 mg-dose) [semaglutide(0.25 or 0.5mg -dos)], Hydralazine, and Lisinopril    Review of Systems   Review of Systems  Constitutional:  Negative for fever.  Respiratory:  Positive for shortness of breath.   Cardiovascular:  Positive for leg swelling.  Gastrointestinal:  Negative for vomiting.    Physical Exam Updated Vital Signs BP (!) 227/108   Pulse 84   Temp (!) 97.5 F (36.4 C) (Oral)   Resp (!) 27   Wt (!) 149.1 kg   SpO2 96%   BMI 53.04 kg/m  Physical Exam CONSTITUTIONAL: Ill-appearing HEAD: Normocephalic/atraumatic EYES: EOMI/PERRL ENMT: Mucous membranes moist NECK: supple no meningeal signs CV: S1/S2 noted, no murmurs/rubs/gallops noted LUNGS: Crackles noted in the bases, mild tachypnea ABDOMEN: soft, nontender, obese NEURO: Pt is awake/alert/appropriate, moves all extremitiesx4.  No facial droop.   EXTREMITIES: pulses normal/equal, full ROM, dialysis access to left arm with thrill noted Lower extremity edema is noted SKIN: warm, color normal PSYCH: Anxious  ED Results / Procedures / Treatments   Labs (all labs ordered are listed, but only abnormal results are displayed) Labs Reviewed  CBC WITH DIFFERENTIAL/PLATELET - Abnormal; Notable for the following components:      Result Value   RBC 3.78 (*)    Hemoglobin 10.6 (*)    HCT 33.8 (*)    All other components within normal limits  BASIC METABOLIC PANEL - Abnormal; Notable for the following components:   Sodium 131 (*)    Potassium 6.3 (*)    Chloride 92 (*)    Glucose, Bld 298 (*)    BUN 90 (*)    Creatinine, Ser 12.54 (*)    Calcium 8.5 (*)    GFR, Estimated 4 (*)    Anion gap 16 (*)    All other components within normal limits    EKG EKG Interpretation Date/Time:  Tuesday May 05 2023 04:18:28  EDT Ventricular Rate:  82 PR Interval:  254 QRS Duration:  107 QT Interval:  409 QTC Calculation: 478 R Axis:   -65  Text Interpretation: Sinus rhythm Prolonged PR interval LAD, consider left anterior fascicular block Probable anteroseptal infarct, old Interpretation limited secondary to artifact Confirmed by Zadie Rhine ([id number]) on 05/05/2023 4:39:43 AM  Radiology DG Chest Portable 1 View  Result Date: 05/05/2023 CLINICAL DATA:  Shortness of breath and lower extremity edema. EXAM: PORTABLE CHEST 1 VIEW COMPARISON:  Portable chest 01/11/2023. FINDINGS: There is cardiomegaly with mild perihilar vascular congestion, mild interstitial edema noted new in the interval. There are minimal pleural effusions beginning to form. No acute airspace infiltrate  is seen. The mediastinum is normally outlined. There is minimal aortic atherosclerosis. Thoracic spondylosis. IMPRESSION: Cardiomegaly with mild perihilar vascular congestion, mild interstitial edema and minimal pleural effusions consistent with CHF or fluid overload. Electronically Signed   By: Almira Bar M.D.   On: 05/05/2023 04:45    Procedures .Critical Care  Performed by: Zadie Rhine, MD Authorized by: Zadie Rhine, MD   Critical care provider statement:    Critical care time (minutes):  60   Critical care start time:  05/05/2023 5:00 AM   Critical care end time:  05/05/2023 6:00 AM   Critical care was necessary to treat or prevent imminent or life-threatening deterioration of the following conditions:  Metabolic crisis, renal failure and respiratory failure   Critical care was time spent personally by me on the following activities:  Examination of patient, discussions with consultants, review of old charts, re-evaluation of patient's condition, pulse oximetry, ordering and review of radiographic studies and ordering and review of laboratory studies   I assumed direction of critical care for this patient from another provider in my  specialty: no       Medications Ordered in ED Medications  furosemide (LASIX) injection 40 mg (has no administration in time range)  sodium zirconium cyclosilicate (LOKELMA) packet 10 g (has no administration in time range)  insulin aspart (novoLOG) injection 5 Units (has no administration in time range)    And  dextrose 50 % solution 50 mL (has no administration in time range)  nitroGLYCERIN (NITROGLYN) 2 % ointment 1 inch (1 inch Topical Given 05/05/23 0518)    ED Course/ Medical Decision Making/ A&P Clinical Course as of 05/05/23 0630  Tue May 05, 2023  0621 Patient to have acute hyperkalemia without EKG changes.  Chest x-ray also reveals pulm edema.  Patient is protecting his airway at this time.  Nitropaste has been given to patient.  Will also give meds for his hyperkalemia [DW]  0621 Discussed the case with Dr. Thedore Mins with nephrology.  He will arrange dialysis later this morning.  Patient will be admitted to the hospitalist [DW]  0630 D/w dr zierle for admission  [DW]    Clinical Course User Index [DW] Zadie Rhine, MD                             Medical Decision Making Amount and/or Complexity of Data Reviewed Labs: ordered. Radiology: ordered. ECG/medicine tests: ordered.  Risk OTC drugs. Prescription drug management. Decision regarding hospitalization.   This patient presents to the ED for concern of shortness of breath, this involves an extensive number of treatment options, and is a complaint that carries with it a high risk of complications and morbidity.  The differential diagnosis includes but is not limited to Acute coronary syndrome, pneumonia, acute pulmonary edema, pneumothorax, acute anemia, pulmonary embolism   Comorbidities that complicate the patient evaluation: Patient's presentation is complicated by their history of end stage renal disease  Social Determinants of Health: Patient's  multiple ER visits and admissions   increases the complexity of  managing their presentation  Additional history obtained: Records reviewed previous admission documents  Lab Tests: I Ordered, and personally interpreted labs.  The pertinent results include:  renal failure, hyperkalemia  Imaging Studies ordered: I ordered imaging studies including X-ray chest   I independently visualized and interpreted imaging which showed CHF noted I agree with the radiologist interpretation  Cardiac Monitoring: The patient was maintained on a cardiac  monitor.  I personally viewed and interpreted the cardiac monitor which showed an underlying rhythm of:  sinus rhythm  Medicines ordered and prescription drug management: I ordered medication including nitro  for hypertension  Reevaluation of the patient after these medicines showed that the patient    stayed the same   Critical Interventions:  treatment for hyperkalemia, admission for dialysis  Consultations Obtained: I requested consultation with the consultant nephrology , and discussed  findings as well as pertinent plan - they recommend: admit, dialysis  Reevaluation: After the interventions noted above, I reevaluated the patient and found that they have :stayed the same  Complexity of problems addressed: Patient's presentation is most consistent with  acute presentation with potential threat to life or bodily function  Disposition: After consideration of the diagnostic results and the patient's response to treatment,  I feel that the patent would benefit from admission   .           Final Clinical Impression(s) / ED Diagnoses Final diagnoses:  ESRD (end stage renal disease) (HCC)  Acute pulmonary edema (HCC)  Hyperkalemia    Rx / DC Orders ED Discharge Orders     None         Zadie Rhine, MD 05/05/23 0630

## 2023-05-05 NOTE — Assessment & Plan Note (Signed)
>>  ASSESSMENT AND PLAN FOR ACUTE ON CHRONIC DIASTOLIC HF (HEART FAILURE) (HCC) WRITTEN ON 05/05/2023  8:45 AM BY MADERA, CARLOS, MD  -Most recent echo with ejection fraction 60-65% -Volume management with hemodialysis -Follow daily weights and strict I's and O's -Fluid restriction to 2 L daily has been recommended/ordered. -Low-sodium diet education provided to patient.

## 2023-05-05 NOTE — Assessment & Plan Note (Signed)
-  In the setting of diet indiscretion and medication noncompliance -Dialysis will be provided to help controlling volume status -Low-sodium diet discussed with patient -Continue to follow daily weights and strict I's and O's.

## 2023-05-05 NOTE — Assessment & Plan Note (Signed)
-  Hyperkalemia protocol initiated including Lokelma, albuterol and calcitriol) -Patient will receive hemodialysis this morning -Continue to follow nephrology service recommendation.

## 2023-05-05 NOTE — H&P (Signed)
History and Physical    Patient: Kelly Key NWG:[id number] DOB: [birth year] DOA: 05/05/2023 DOS: the patient was seen and examined on 05/05/2023 PCP: Pearson Grippe, MD  Patient coming from: Home  Chief Complaint: No chief complaint on file.  HPI: Kelly Key is a 52 y.o. male with medical history significant of history of COPD, gout, hypertension, class III obesity and hyperlipidemia; who presented to the hospital with complaints of shortness of breath.  Patient's symptoms presented the night prior to admission and he reported associated orthopnea, dyspnea on exertion and increased lower extremity swelling.  Patient reports last hemodialysis session was on July 6 and and expressed no missing any treatments lately.  Per records patient with diet indiscretion and medication noncompliance.  No chest pain, no nausea, no vomiting, no fever, no focal weaknesses; no melena, no hematochezia or any other acute complaints.  Workup in the ED demonstrating interstitial edema on chest x-ray along with vascular congestion findings; patient was hyperkalemic with a potassium of 6.3 and creatinine 12.54; patient normally due for dialysis Tuesday-Thursday-Saturday.  There was also presence of uncontrolled hypertension.  Medical therapy for hyperkalemia initiated and nephrology service consulted for hemodialysis.  TRH consulted to place patient in the hospital for further evaluation and management.  Review of Systems: As mentioned in the history of present illness. All other systems reviewed and are negative. Past Medical History:  Diagnosis Date   Acute diastolic CHF (congestive heart failure) (HCC) 06/11/2012   EF 50-55% [facility])   Chronic kidney disease    COPD (chronic obstructive pulmonary disease) (HCC)    Diabetes mellitus    A1c 11.5 (06/11/2012).   Dyspnea    Gout    Hepatic steatosis 06/11/2012   Elevated LFTs   Hyperlipemia    Malignant hypertension    Microcytic anemia 06/12/2012   Obesity     Past Surgical History:  Procedure Laterality Date   ABDOMINAL AORTOGRAM W/LOWER EXTREMITY N/A 12/01/2022   Procedure: ABDOMINAL AORTOGRAM W/LOWER EXTREMITY;  Surgeon: Cephus Shelling, MD;  Location: [facility] INVASIVE CV LAB;  Service: Cardiovascular;  Laterality: N/A;   AMPUTATION Left 12/03/2022   Procedure: LEFT SECOND TOE AMPUTATION;  Surgeon: Nadara Mustard, MD;  Location: [facility] OR;  Service: Orthopedics;  Laterality: Left;   AV FISTULA PLACEMENT Left 11/11/2019   Procedure: ARTERIOVENOUS (AV) FISTULA CREATION LEFT BRACHIOCEPHALIC ARM;  Surgeon: Nada Libman, MD;  Location: [facility] OR;  Service: Vascular;  Laterality: Left;   COLONOSCOPY WITH PROPOFOL N/A 09/02/2021   Procedure: COLONOSCOPY WITH PROPOFOL;  Surgeon: Wyline Mood, MD;  Location: [facility] ENDOSCOPY;  Service: Gastroenterology;  Laterality: N/A;   IR FLUORO GUIDE CV LINE RIGHT  07/08/2019   IR REMOVAL TUN CV CATH W/O FL  03/23/2020   IR US GUIDE VASC ACCESS RIGHT  07/08/2019   LEFT HEART CATH AND CORONARY ANGIOGRAPHY N/A 06/12/2021   Procedure: LEFT HEART CATH AND CORONARY ANGIOGRAPHY;  Surgeon: Lyn Records, MD;  Location: [facility] INVASIVE CV LAB;  Service: Cardiovascular;  Laterality: N/A;   NO PAST SURGERIES     PERIPHERAL VASCULAR BALLOON ANGIOPLASTY Left 12/01/2022   Procedure: PERIPHERAL VASCULAR BALLOON ANGIOPLASTY;  Surgeon: Cephus Shelling, MD;  Location: [facility] INVASIVE CV LAB;  Service: Cardiovascular;  Laterality: Left;  Anterior tibial   PERIPHERAL VASCULAR INTERVENTION Left 12/01/2022   Procedure: PERIPHERAL VASCULAR INTERVENTION;  Surgeon: Cephus Shelling, MD;  Location: [facility] INVASIVE CV LAB;  Service: Cardiovascular;  Laterality: Left;  sfa   VASCULAR SURGERY  Social History:  reports that he has never smoked. He has been exposed to tobacco smoke. He has never used smokeless tobacco. He reports that he does not currently use alcohol. He reports that he does not use drugs.  Allergies  Allergen Reactions   Ozempic (0.25 Or  0.5 Mg-Dose) [Semaglutide(0.25 Or 0.5mg -Dos)] Shortness Of Breath   Hydralazine Anxiety    Hallucinations    Lisinopril Swelling    Family History  Problem Relation Age of Onset   Gout Mother    Asthma Mother    Diabetes Father    Heart failure Father    Diabetes Sister    Hypertension Brother    Pancreatic cancer Brother    Diabetes Sister     Prior to Admission medications   Medication Sig Start Date End Date Taking? Authorizing Provider  acetaminophen (TYLENOL) 500 MG tablet Take 1,000 mg by mouth every 6 (six) hours as needed for moderate pain or headache.    [provider]  albuterol (VENTOLIN HFA) 108 (90 Base) MCG/ACT inhaler Inhale 2 puffs into the lungs every 4 (four) hours as needed for wheezing or shortness of breath.  03/24/19   [provider]  allopurinol (ZYLOPRIM) 100 MG tablet Take 2 tablets (200 mg total) by mouth daily. 08/04/19   Love, Evlyn Kanner, PA-C  AMBULATORY NON FORMULARY MEDICATION Inject 0.2 ml by intracavernosal route as directed.  Medication Name: TriMix PGE 10 mcg Pap   30 mg Phent 1 mg 01/01/22   Stoneking, Danford Bad., MD  amLODipine (NORVASC) 5 MG tablet Take 5 mg by mouth daily. 01/30/22   [provider]  aspirin EC 81 MG EC tablet Take 1 tablet (81 mg total) by mouth daily. 08/04/19   Love, Evlyn Kanner, PA-C  atorvastatin (LIPITOR) 80 MG tablet Take 1 tablet (80 mg total) by mouth daily. 12/05/19   Antoine Poche, MD  carvedilol (COREG) 25 MG tablet Take 1 tablet (25 mg total) by mouth 2 (two) times daily with a meal. 08/03/19   Love, Evlyn Kanner, PA-C  cloNIDine (CATAPRES) 0.3 MG tablet Take 0.3 mg by mouth 3 (three) times daily.    [provider]  esomeprazole (NEXIUM) 20 MG capsule Take 20 mg by mouth daily. 01/23/22   [provider]  ezetimibe (ZETIA) 10 MG tablet Take 10 mg by mouth every evening. 01/20/19   [provider]  fluticasone (FLONASE) 50 MCG/ACT nasal spray Place 1 spray into both  nostrils daily as needed for allergies or rhinitis.    [provider]  gabapentin (NEURONTIN) 100 MG capsule Take 1 capsule (100 mg total) by mouth 3 (three) times daily. 08/03/19   Love, Evlyn Kanner, PA-C  glucose blood (ACCU-CHEK AVIVA PLUS) test strip Use as instructed TO CHECK BLOOD GLUCOSE THREE TIMES DAILY 08/15/20   Nida, Denman George, MD  HUMALOG KWIKPEN 100 UNIT/ML KwikPen Inject 8-14 Units into the skin 3 (three) times daily. 08/19/22   [provider]  Insulin Pen Needle (B-D ULTRAFINE III SHORT PEN) 31G X 8 MM MISC 1 each by Does not apply route as directed. 08/13/20   Roma Kayser, MD  Ipratropium-Albuterol (COMBIVENT) 20-100 MCG/ACT AERS respimat Inhale 1 puff into the lungs every 6 (six) hours as needed for wheezing or shortness of breath. 11/05/22 01/12/23  Sherryll Burger, Pratik D, DO  LEVEMIR FLEXTOUCH 100 UNIT/ML FlexTouch Pen INJECT 50 UNITS SUBCUTANEOUSLY AT BEDTIME Patient taking differently: Inject 50 Units into the skin at bedtime. 07/09/21   Ronny Bacon  J, NP  multivitamin (RENA-VIT) TABS tablet Take 1 tablet by mouth at bedtime. 08/03/19   Love, Evlyn Kanner, PA-C  OZEMPIC, 1 MG/DOSE, 4 MG/3ML SOPN Inject 1 mg into the skin once a week. Fridays.    [provider]  sevelamer carbonate (RENVELA) 800 MG tablet Take 3 tablets (2,400 mg total) by mouth 3 (three) times daily with meals. 08/03/19   Jacquelynn Cree, PA-C    Physical Exam: Vitals:   05/05/23 0411 05/05/23 0413 05/05/23 0446  BP:  (!) 227/108   Pulse:  84   Resp:  (!) 27   Temp:   (!) 97.5 F (36.4 C)  TempSrc:   Oral  SpO2:  96%   Weight: (!) 149.1 kg     General exam: Alert, awake, oriented x 3; reporting feeling short of breath; no nausea, no vomiting, no chest pain.  Semierect on ED stretcher at time of examination. Respiratory system: Decreased breath sounds at the bases with positive fine crackles; no using accessory muscle.  Good saturation on room air. Cardiovascular  system:RRR. No rubs or gallops; unable to assess JVD with body habitus. Gastrointestinal system: Abdomen is obese, nondistended, soft and nontender. No organomegaly or masses felt. Normal bowel sounds heard. Central nervous system: Alert and oriented. No focal neurological deficits. Extremities: No cyanosis or clubbing; 1-2+ edema appreciated bilaterally. Skin: No petechiae. Psychiatry: Judgement and insight appear normal. Mood & affect appropriate.   Data Reviewed: CBC: White blood cells 8.4, hemoglobin 10.6 and platelet count 236 K. Basic metabolic panel: Sodium 131, potassium 6.3, chloride 92, bicarb 23, glucose 298, BUN 90, creatinine 2.54 and anion gap 16; GFR 4 Chest x-ray demonstrating vascular congestion, interstitial edema and minimal bilateral pleural effusion.  No acute infiltrate or opacity appreciated on exam.  Assessment and Plan: ESRD (end stage renal disease) [facility]) -Nephrology service has been consulted for continuation of dialysis and further assistance with management of electrolyte abnormalities -Follow electrolytes trend and clinical response. -Renal diet recommended.  Hyperkalemia -Hyperkalemia protocol initiated including Lokelma, albuterol and calcitriol) -Patient will receive hemodialysis this morning -Continue to follow nephrology service recommendation.  GERD (gastroesophageal reflux disease) -Continue the use of PPI.  OSA (obstructive sleep apnea) -Continue the use of CPAP nightly  Uncontrolled type 2 diabetes mellitus with hyperglycemia (HCC) -Most recent A1c 11.6 -Will update A1c level -Sliding scale insulin and the use of cyclic will be provided -Follow CBGs fluctuation -Modified carbohydrate diet discussed with patient.  Volume overload -In the setting of diet indiscretion and medication noncompliance -Dialysis will be provided to help controlling volume status -Low-sodium diet discussed with patient -Continue to follow daily weights and strict  I's and O's.  Acute on chronic diastolic HF (heart failure) (HCC) -Most recent echo with ejection fraction 60-65% -Volume management with hemodialysis -Follow daily weights and strict I's and O's -Fluid restriction to 2 L daily has been recommended/ordered. -Low-sodium diet education provided to patient.  Morbid obesity (HCC) -Body mass index is 53.04 kg/m. -Low-calorie diet, portion control and increase physical activity discussed with patient.  Essential hypertension, benign -Resume home antihypertensive regimen -Low-sodium diet has been encouraged -Dialysis service for volume and blood pressure management with adjustment to dry weight per nephrology service.     Advance Care Planning:   Code Status: Full Code   Consults: Nephrology service  Family Communication: No family at bedside.  Severity of Illness: The appropriate patient status for this patient is INPATIENT. Inpatient status is judged to be reasonable and necessary in order  to provide the required intensity of service to ensure the patient's safety. The patient's presenting symptoms, physical exam findings, and initial radiographic and laboratory data in the context of their chronic comorbidities is felt to place them at high risk for further clinical deterioration. Furthermore, it is not anticipated that the patient will be medically stable for discharge from the hospital within 2 midnights of admission.   * I certify that at the point of admission it is my clinical judgment that the patient will require inpatient hospital care spanning beyond 2 midnights from the point of admission due to high intensity of service, high risk for further deterioration and high frequency of surveillance required.*  Author: Vassie Loll, MD 05/05/2023 7:44 AM  For on call review [email or web address].

## 2023-05-05 NOTE — Progress Notes (Signed)
   HEMODIALYSIS TREATMENT NOTE:  4 hour low-heparin treatment completed using left upper arm AVF (15g/antegrade). Goal challenge met: 4.6 liters removed without interruption in UF.  Hypertensive throughout treatment - due for Coreg and Clonidine post-HD.  Post weight on standing scale 144.0 (EDW 142 kg).    05/05/23 1745  Vitals  Temp 98.6 F (37 C)  Temp Source Oral  BP (!) 163/99  MAP (mmHg) 123  BP Location Right Wrist  BP Method Automatic  Patient Position (if appropriate) Lying  Pulse Rate 77  Pulse Rate Source Monitor  ECG Heart Rate 78  Resp 17  Oxygen Therapy  SpO2 100 %  O2 Device Room Air  Patient Activity (if Appropriate) In bed  Post Treatment  Dialyzer Clearance Lightly streaked  Duration of HD Treatment -hour(s) 4 hour(s)  Hemodialysis Intake (mL) 0 mL  Liters Processed 92.1  Fluid Removed (mL) 4600 mL  Tolerated HD Treatment Yes  Post-Hemodialysis Comments Goal met  AVG/AVF Arterial Site Held (minutes) 10 minutes  AVG/AVF Venous Site Held (minutes) 10 minutes  Fistula / Graft Left Upper arm Arteriovenous fistula  Placement Date/Time: 11/11/19 0843   Placed prior to admission: No  Orientation: Left  Access Location: Upper arm  Access Type: Arteriovenous fistula  Fistula / Graft Assessment Thrill;Bruit  Status Patent   Transported post-HD to 310 and hand-off given to [facility], LPN.   Arman Filter, RN AP [facility]

## 2023-05-05 NOTE — Progress Notes (Signed)
   05/05/23 1807  Vitals  Temp 98.7 F (37.1 C)  Temp Source Axillary  BP (!) 174/104 (Will notify nurse.)  MAP (mmHg) 123  BP Location Right Wrist  BP Method Automatic  Patient Position (if appropriate) Lying  Pulse Rate 77  Pulse Rate Source Dinamap  Resp 18  MEWS COLOR  MEWS Score Color Green  Oxygen Therapy  SpO2 98 %  O2 Device Room Air  MEWS Score  MEWS Temp 0  MEWS Systolic 0  MEWS Pulse 0  MEWS RR 0  MEWS LOC 0  MEWS Score 0  Provider Notification  Provider Name/Title Dr Camillo Flaming  Date Provider Notified 05/05/23  Time Provider Notified 1841  Method of Notification Page  Notification Reason Other (Comment) (b/p 174/104)  Date Critical Result Received 05/05/23  Time Critical Result Received 1842  Provider response No new orders  Date of Provider Response 05/05/23  Time of Provider Response 1842

## 2023-05-05 NOTE — Assessment & Plan Note (Signed)
-  Resume home antihypertensive regimen -Low-sodium diet has been encouraged -Dialysis service for volume and blood pressure management with adjustment to dry weight per nephrology service.

## 2023-05-05 NOTE — Assessment & Plan Note (Signed)
-  Body mass index is 53.04 kg/m. -Low-calorie diet, portion control and increase physical activity discussed with patient.

## 2023-05-05 NOTE — Progress Notes (Signed)
Patient placed on full face mask auto CPAP for the night and tolerating well at this time.

## 2023-05-05 NOTE — Assessment & Plan Note (Signed)
>>  ASSESSMENT AND PLAN FOR MORBID OBESITY (HCC) WRITTEN ON 05/05/2023  8:42 AM BY MADERA, CARLOS, MD  -Body mass index is 53.04 kg/m. -Low-calorie diet, portion control and increase physical activity discussed with patient.

## 2023-05-06 DIAGNOSIS — N186 End stage renal disease: Secondary | ICD-10-CM

## 2023-05-06 DIAGNOSIS — Z992 Dependence on renal dialysis: Secondary | ICD-10-CM | POA: Diagnosis not present

## 2023-05-06 LAB — CBC
HCT: 32.1 % — ABNORMAL LOW (ref 39.0–52.0)
Hemoglobin: 10.3 g/dL — ABNORMAL LOW (ref 13.0–17.0)
MCH: 28.4 pg (ref 26.0–34.0)
MCHC: 32.1 g/dL (ref 30.0–36.0)
MCV: 88.4 fL (ref 80.0–100.0)
Platelets: 242 10*3/uL (ref 150–400)
RBC: 3.63 MIL/uL — ABNORMAL LOW (ref 4.22–5.81)
RDW: 14.6 % (ref 11.5–15.5)
WBC: 6.6 10*3/uL (ref 4.0–10.5)
nRBC: 0 % (ref 0.0–0.2)

## 2023-05-06 LAB — BASIC METABOLIC PANEL
Anion gap: 14 (ref 5–15)
BUN: 67 mg/dL — ABNORMAL HIGH (ref 6–20)
CO2: 25 mmol/L (ref 22–32)
Calcium: 8.4 mg/dL — ABNORMAL LOW (ref 8.9–10.3)
Chloride: 93 mmol/L — ABNORMAL LOW (ref 98–111)
Creatinine, Ser: 9.92 mg/dL — ABNORMAL HIGH (ref 0.61–1.24)
GFR, Estimated: 6 mL/min — ABNORMAL LOW (ref >60)
Glucose, Bld: 244 mg/dL — ABNORMAL HIGH (ref 70–99)
Potassium: 4.3 mmol/L (ref 3.5–5.1)
Sodium: 132 mmol/L — ABNORMAL LOW (ref 135–145)

## 2023-05-06 LAB — MAGNESIUM: Magnesium: 2.2 mg/dL (ref 1.7–2.4)

## 2023-05-06 LAB — PHOSPHORUS: Phosphorus: 5.4 mg/dL — ABNORMAL HIGH (ref 2.5–4.6)

## 2023-05-06 LAB — HEPATITIS B SURFACE ANTIBODY, QUANTITATIVE: Hep B S AB Quant (Post): 19.3 m[IU]/mL

## 2023-05-06 MED ORDER — CLONIDINE HCL 0.3 MG PO TABS: 0.3000 mg | ORAL_TABLET | Freq: Three times a day (TID) | ORAL | 3 refills | Status: AC

## 2023-05-06 MED ORDER — IPRATROPIUM-ALBUTEROL 20-100 MCG/ACT IN AERS: 1.0000 | INHALATION_SPRAY | Freq: Four times a day (QID) | RESPIRATORY_TRACT | 2 refills | Status: DC | PRN

## 2023-05-06 MED ORDER — ASPIRIN 81 MG PO TBEC: 81.0000 mg | DELAYED_RELEASE_TABLET | Freq: Every day | ORAL | 4 refills | Status: AC

## 2023-05-06 MED ORDER — ALBUTEROL SULFATE HFA 108 (90 BASE) MCG/ACT IN AERS: 2.0000 | INHALATION_SPRAY | RESPIRATORY_TRACT | 3 refills | Status: DC | PRN

## 2023-05-06 MED ORDER — LEVEMIR FLEXTOUCH 100 UNIT/ML ~~LOC~~ SOPN: 50.0000 [IU] | PEN_INJECTOR | Freq: Every day | SUBCUTANEOUS | 3 refills | Status: DC

## 2023-05-06 MED ORDER — ATORVASTATIN CALCIUM 80 MG PO TABS: 80.0000 mg | ORAL_TABLET | Freq: Every day | ORAL | 3 refills | Status: DC

## 2023-05-06 MED ORDER — CARVEDILOL 25 MG PO TABS: 25.0000 mg | ORAL_TABLET | Freq: Two times a day (BID) | ORAL | 5 refills | Status: AC

## 2023-05-06 NOTE — Progress Notes (Signed)
Patient ID: Kelly Key, male   DOB: 10-May-1971, 52 y.o.   MRN: [id number] S: Feeling much better after HD and UF of 4.6L yesterday but still feels like he has extra fluid. O:BP (!) 164/88 (BP Location: Left Arm)   Pulse 79   Temp 98.5 F (36.9 C) (Oral)   Resp 18   Ht 5\' 6"  (1.676 m)   Wt (!) 145.6 kg   SpO2 96%   BMI 51.81 kg/m   Intake/Output Summary (Last 24 hours) at 05/06/2023 1024 Last data filed at 05/06/2023 0900 Gross per 24 hour  Intake 720 ml  Output 4600 ml  Net -3880 ml   Intake/Output: I/O last 3 completed shifts: In: 480 [P.O.:480] Out: 4600 [Other:4600]  Intake/Output this shift:  Total I/O In: 240 [P.O.:240] Out: -  Weight change: -0.002 kg Gen: NAD CVS: RRR Resp:CTA Abd: +BS, soft, NT/ND Ext: trace pretibial edema, LUE AVF +T/B  Recent Labs  Lab 05/05/23 0523 05/06/23 0430  NA 131* 132*  K 6.3* 4.3  CL 92* 93*  CO2 23 25  GLUCOSE 298* 244*  BUN 90* 67*  CREATININE 12.54* 9.92*  CALCIUM 8.5* 8.4*  PHOS  --  5.4*   Liver Function Tests: No results for input(s): "AST", "ALT", "ALKPHOS", "BILITOT", "PROT", "ALBUMIN" in the last 168 hours. No results for input(s): "LIPASE", "AMYLASE" in the last 168 hours. No results for input(s): "AMMONIA" in the last 168 hours. CBC: Recent Labs  Lab 05/05/23 0523 05/06/23 0430  WBC 8.4 6.6  NEUTROABS 5.5  --   HGB 10.6* 10.3*  HCT 33.8* 32.1*  MCV 89.4 88.4  PLT 236 242   Cardiac Enzymes: No results for input(s): "CKTOTAL", "CKMB", "CKMBINDEX", "TROPONINI" in the last 168 hours. CBG: No results for input(s): "GLUCAP" in the last 168 hours.  Iron Studies: No results for input(s): "IRON", "TIBC", "TRANSFERRIN", "FERRITIN" in the last 72 hours. Studies/Results: DG Chest Portable 1 View  Result Date: 05/05/2023 CLINICAL DATA:  Shortness of breath and lower extremity edema. EXAM: PORTABLE CHEST 1 VIEW COMPARISON:  Portable chest 01/11/2023. FINDINGS: There is cardiomegaly with mild perihilar vascular  congestion, mild interstitial edema noted new in the interval. There are minimal pleural effusions beginning to form. No acute airspace infiltrate is seen. The mediastinum is normally outlined. There is minimal aortic atherosclerosis. Thoracic spondylosis. IMPRESSION: Cardiomegaly with mild perihilar vascular congestion, mild interstitial edema and minimal pleural effusions consistent with CHF or fluid overload. Electronically Signed   By: Almira Bar M.D.   On: 05/05/2023 04:45    allopurinol  200 mg Oral Daily   amLODipine  5 mg Oral Daily   aspirin EC  81 mg Oral Daily   atorvastatin  80 mg Oral Daily   carvedilol  25 mg Oral BID WC   Chlorhexidine Gluconate Cloth  6 each Topical Q0600   cloNIDine  0.3 mg Oral TID   doxercalciferol  2 mcg Intravenous Q T,Th,Sa-HD   ezetimibe  10 mg Oral QPM   gabapentin  100 mg Oral TID   heparin  5,000 Units Subcutaneous Q8H   multivitamin  1 tablet Oral QHS   pantoprazole  40 mg Oral Daily   sevelamer carbonate  2,400 mg Oral TID WC    BMET    Component Value Date/Time   NA 132 (L) 05/06/2023 0430   NA 137 06/10/2021 0955   K 4.3 05/06/2023 0430   CL 93 (L) 05/06/2023 0430   CO2 25 05/06/2023 0430   GLUCOSE 244 (  H) 05/06/2023 0430   BUN 67 (H) 05/06/2023 0430   BUN 66 (H) 06/10/2021 0955   CREATININE 9.92 (H) 05/06/2023 0430   CALCIUM 8.4 (L) 05/06/2023 0430   GFRNONAA 6 (L) 05/06/2023 0430   GFRAA TEST CANCELLED PER RN 05/18/2021 1147   CBC    Component Value Date/Time   WBC 6.6 05/06/2023 0430   RBC 3.63 (L) 05/06/2023 0430   HGB 10.3 (L) 05/06/2023 0430   HGB 10.5 (L) 06/10/2021 0952   HCT 32.1 (L) 05/06/2023 0430   HCT 31.5 (L) 06/10/2021 0952   PLT 242 05/06/2023 0430   PLT 310 06/10/2021 0952   MCV 88.4 05/06/2023 0430   MCV 88 06/10/2021 0952   MCH 28.4 05/06/2023 0430   MCHC 32.1 05/06/2023 0430   RDW 14.6 05/06/2023 0430   RDW 14.8 06/10/2021 0952   LYMPHSABS 1.7 05/05/2023 0523   MONOABS 0.6 05/05/2023 0523    EOSABS 0.5 05/05/2023 0523   BASOSABS 0.1 05/05/2023 0523    Outpatient orders: [facility].  TTS.  4 hours 15 minutes.  LUE AVF Flow rates: 450/500.  3K, 2.5 calcium.  EDW 142 kg.  Meds: Calcitriol 2.75 mcg q. treatment, Venofer 50 mg once weekly (due today), Mircera 60 mcg every 2 weeks (last dose last week). Heparin 2600 units load, 1000 units/hr    Assessment/Plan:  Hyperkalemia - treated medically in ED and resolved after urgent HD yesterday.  ESRD -  Tolerated HD well yesterday.  Plan for HD today since he is still 3 liters above his edw.  Hypertension/volume  -  remains elevated even after 4.6L UF with HD yesterday.  Plan for another HD session today and UF to try and get back to edw. Discussed the need for fluid restriction.  He should be able to go home today after HD.   Anemia  - stable Hgb, no ESA for now  Metabolic bone disease -   continue with home meds  Nutrition -  renal diet, carb modified Disposition - hopeful discharge to home after HD today.  Irena Cords, MD [facility]

## 2023-05-06 NOTE — TOC Initial Note (Signed)
Transition of Care [facility]) - Initial/Assessment Note    Patient Details  Name: Kelly Key MRN: [id number] Date of Birth: [birth year]  [facility]) CM/SW Contact:    Annice Needy, LCSW Phone Number: 05/06/2023, 3:16 PM  Clinical Narrative:                 Patient from home. Considered high risk for readmission. On HD. Has wc, walker,cane, dentures, shower chair with back. Independent at baseline. TOC will follow for needs.   Expected Discharge Plan: Home/Self Care Barriers to Discharge: Continued Medical Work up   Patient Goals and CMS Choice Patient states their goals for this hospitalization and ongoing recovery are:: patient is independent at baseline          Expected Discharge Plan and Services       Living arrangements for the past 2 months: Single Family Home                                      Prior Living Arrangements/Services Living arrangements for the past 2 months: Single Family Home   Patient language and need for interpreter reviewed:: Yes Do you feel safe going back to the place where you live?: Yes      Need for Family Participation in Patient Care: Yes (Comment) Care giver support system in place?: Yes (comment) Current home services: DME (w/c, walker, cane, dentures, shower chair with back.) Criminal Activity/Legal Involvement Pertinent to Current Situation/Hospitalization: No - Comment as needed  Activities of Daily Living      Permission Sought/Granted                  Emotional Assessment       Orientation: : Oriented to Self, Oriented to Place, Oriented to  Time, Oriented to Situation Alcohol / Substance Use: Not Applicable Psych Involvement: No (comment)  Admission diagnosis:  Hyperkalemia [E87.5] Acute pulmonary edema (HCC) [J81.0] ESRD (end stage renal disease) (HCC) [N18.6] Patient Active Problem List   Diagnosis Date Noted   Hyperkalemia 05/05/2023   PAD (peripheral artery disease) (HCC) 12/01/2022    Gangrene of toe of left foot (HCC) 11/28/2022   Diabetes mellitus type 2 in obese 11/27/2022   GERD (gastroesophageal reflux disease) 11/27/2022   Flash pulmonary edema (HCC) 11/04/2022   Dysphagia 10/18/2022   Osteomyelitis of second toe of left foot (HCC) 11/13/2021   OSA (obstructive sleep apnea) 10/04/2021   Obesity hypoventilation syndrome (HCC) 10/04/2021   Organic impotence 08/16/2021   NSTEMI (non-ST elevated myocardial infarction) (HCC) 05/17/2021   Acute gout of right knee 08/03/2019   Orthostasis    Pain    Physical debility 07/16/2019   ESRD (end stage renal disease) (HCC)    Anemia of chronic disease    Uncontrolled type 2 diabetes mellitus with hyperglycemia (HCC)    Volume overload 07/05/2019   Anasarca 07/03/2019   Dyslipidemia 07/03/2019   Chronic combined systolic and diastolic CHF (congestive heart failure) (HCC) 07/03/2019   Bilateral leg edema    Hypokalemia    Acute on chronic diastolic HF (heart failure) (HCC) 05/05/2019   Thromboembolism (HCC) 02/20/2019   Diabetes mellitus type 2 in nonobese (HCC) 06/12/2012   Hyponatremia 06/12/2012   Microcytic anemia 06/12/2012   Essential hypertension, benign 06/11/2012   Elevated LFTs 06/11/2012   Morbid obesity (HCC) 06/11/2012   Hepatic steatosis 06/11/2012   PCP:  Pearson Grippe, MD Pharmacy:  [facility] [address], [place] - [address] [place] [address] [place] [address] Phone: [phone] Fax: [phone]  [facility] [place], [place] - [address] [place] [address] Phone: [phone] Fax: [phone]     Social Determinants of Health (SDOH) Social History: SDOH Screenings   Food Insecurity: No Food Insecurity (01/12/2023)  Housing: Low Risk  (01/12/2023)  Transportation Needs: No Transportation Needs (01/12/2023)  Utilities: Not At Risk (01/12/2023)  Depression (PHQ2-9): Low Risk  (12/25/2021)  Financial Resource Strain: Low Risk  (02/20/2019)  Physical Activity:  Unknown (02/20/2019)  Social Connections: Unknown (02/20/2019)  Stress: No Stress Concern Present (02/20/2019)  Tobacco Use: Low Risk  (05/05/2023)   SDOH Interventions:     Readmission Risk Interventions    01/12/2023    1:18 PM 12/02/2022    4:02 PM 11/28/2022    1:43 PM  Readmission Risk Prevention Plan  Transportation Screening  Complete Complete  Medication Review Ocean[employer]) Complete Complete Complete  HRI or Home Care Consult Complete Complete Complete  SW Recovery Care/Counseling Consult Complete Complete Complete  Palliative Care Screening Not Applicable Not Applicable Not Applicable  Skilled Nursing Facility Not Applicable Not Applicable Not Applicable

## 2023-05-06 NOTE — Discharge Summary (Signed)
Kelly Key, is a 52 y.o. male  DOB 04/30/1971  MRN [id number].  Admission date:  05/05/2023  Admitting Physician  Shon Hale, MD  Discharge Date:  05/06/2023   Primary MD  Pearson Grippe, MD  Recommendations for primary care physician for things to follow:   1)Very low-salt diet advised 2)Weigh yourself daily, call if you gain more than 3 pounds in 1 day or more than 5 pounds in 1 week as your diuretic medications may need to be adjusted 3)Limit your Fluid  intake to no more than 60 ounces (1.8 Liters) per day 4) continue hemodialysis on Tuesdays Thursdays and Saturdays 5)Avoid ibuprofen/Advil/Aleve/Motrin/Goody Powders/Naproxen/BC powders/Meloxicam/Diclofenac/Indomethacin and other Nonsteroidal anti-inflammatory medications as these will make you more likely to bleed and can cause stomach ulcers, can also cause Kidney problems.   Admission Diagnosis  Hyperkalemia [E87.5] Acute pulmonary edema (HCC) [J81.0] ESRD (end stage renal disease) (HCC) [N18.6] ESRD (end stage renal disease) on dialysis (HCC) [N18.6, Z99.2]   Discharge Diagnosis  Hyperkalemia [E87.5] Acute pulmonary edema (HCC) [J81.0] ESRD (end stage renal disease) (HCC) [N18.6] ESRD (end stage renal disease) on dialysis (HCC) [N18.6, Z99.2]    Principal Problem:   ESRD (end stage renal disease) on dialysis (HCC) Active Problems:   ESRD (end stage renal disease) (HCC)   Essential hypertension, benign   Morbid obesity (HCC)   Acute on chronic diastolic HF (heart failure) (HCC)   Volume overload   Uncontrolled type 2 diabetes mellitus with hyperglycemia (HCC)   OSA (obstructive sleep apnea)   GERD (gastroesophageal reflux disease)   Hyperkalemia      Past Medical History:  Diagnosis Date   Acute diastolic CHF (congestive heart failure) (HCC) 06/11/2012   EF 50-55% [facility])   Chronic kidney disease    COPD (chronic obstructive  pulmonary disease) (HCC)    Diabetes mellitus    A1c 11.5 (06/11/2012).   Dyspnea    Gout    Hepatic steatosis 06/11/2012   Elevated LFTs   Hyperlipemia    Malignant hypertension    Microcytic anemia 06/12/2012   Obesity     Past Surgical History:  Procedure Laterality Date   ABDOMINAL AORTOGRAM W/LOWER EXTREMITY N/A 12/01/2022   Procedure: ABDOMINAL AORTOGRAM W/LOWER EXTREMITY;  Surgeon: Cephus Shelling, MD;  Location: [facility] INVASIVE CV LAB;  Service: Cardiovascular;  Laterality: N/A;   AMPUTATION Left 12/03/2022   Procedure: LEFT SECOND TOE AMPUTATION;  Surgeon: Nadara Mustard, MD;  Location: [facility] OR;  Service: Orthopedics;  Laterality: Left;   AV FISTULA PLACEMENT Left 11/11/2019   Procedure: ARTERIOVENOUS (AV) FISTULA CREATION LEFT BRACHIOCEPHALIC ARM;  Surgeon: Nada Libman, MD;  Location: [facility] OR;  Service: Vascular;  Laterality: Left;   COLONOSCOPY WITH PROPOFOL N/A 09/02/2021   Procedure: COLONOSCOPY WITH PROPOFOL;  Surgeon: Wyline Mood, MD;  Location: [facility] ENDOSCOPY;  Service: Gastroenterology;  Laterality: N/A;   IR FLUORO GUIDE CV LINE RIGHT  07/08/2019   IR REMOVAL TUN CV CATH W/O FL  03/23/2020   IR US GUIDE VASC ACCESS RIGHT  07/08/2019  LEFT HEART CATH AND CORONARY ANGIOGRAPHY N/A 06/12/2021   Procedure: LEFT HEART CATH AND CORONARY ANGIOGRAPHY;  Surgeon: Lyn Records, MD;  Location: [facility] CV [facility];  Service: Cardiovascular;  Laterality: N/A;   NO PAST SURGERIES     PERIPHERAL VASCULAR BALLOON ANGIOPLASTY Left 12/01/2022   Procedure: PERIPHERAL VASCULAR BALLOON ANGIOPLASTY;  Surgeon: Cephus Shelling, MD;  Location: [facility] CV [facility];  Service: Cardiovascular;  Laterality: Left;  Anterior tibial   PERIPHERAL VASCULAR INTERVENTION Left 12/01/2022   Procedure: PERIPHERAL VASCULAR INTERVENTION;  Surgeon: Cephus Shelling, MD;  Location: [facility] CV LAB;  Service: Cardiovascular;  Laterality: Left;  sfa   VASCULAR SURGERY       HPI  from the history and physical  done on the day of admission:    Chief Complaint: No chief complaint on file.   HPI: [name] is a 52 y.o. male with medical history significant of history of COPD, gout, hypertension, class III obesity and hyperlipidemia; who presented to the hospital with complaints of shortness of breath.  Patient's symptoms presented the night prior to admission and he reported associated orthopnea, dyspnea on exertion and increased lower extremity swelling.  Patient reports last hemodialysis session was on July 6 and and expressed no missing any treatments lately.  Per records patient with diet indiscretion and medication noncompliance.  No chest pain, no nausea, no vomiting, no fever, no focal weaknesses; no melena, no hematochezia or any other acute complaints.   Workup in the ED demonstrating interstitial edema on chest x-ray along with vascular congestion findings; patient was hyperkalemic with a potassium of 6.3 and creatinine 12.54; patient normally due for dialysis Tuesday-Thursday-Saturday.  There was also presence of uncontrolled hypertension.   Medical therapy for hyperkalemia initiated and nephrology service consulted for hemodialysis.  TRH consulted to place patient in the hospital for further evaluation and management.   Review of Systems: As mentioned in the history of present illness. All other systems reviewed and are negative.   Hospital Course:    Assessment and Plan: 1)ESRD (end stage renal disease) with volume overload status --Nephrology consult appreciated -Patient underwent serial/back-to-back hemodialysis on 05/05/2023 and 05/06/2023 to address volume status (4.6 L and 3.2 L removed for a total of 7.8 liters or 17.1 Lbs) -Symptoms improved significantly -No hypoxia -Continue dialysis on Tuesday started on Saturdays  2)Hyperkalemia--potassium was 6.3 on admission --Potassium normalized after hemodialysis  3)GERD (gastroesophageal reflux disease) -Continue the use of  PPI.  4)OSA (obstructive sleep apnea) -Continue the use of CPAP nightly  5)Uncontrolled type 2 diabetes mellitus with hyperglycemia (HCC) -Use Novolog/Humalog Sliding scale insulin with Accu-Cheks/Fingersticks as ordered  -Continue Levemir  Volume overload -In the setting of diet indiscretion and medication noncompliance --Patient underwent serial/back-to-back hemodialysis on 05/05/2023 and 05/06/2023 to address volume status (4.6 L and 3.2 L removed for a total of 7.8 liters or 17.1 Lbs) -Symptoms improved significantly -No hypoxia  Acute on chronic diastolic HF (heart failure) (HCC) -Most recent echo with ejection fraction 60-65% -Volume management with hemodialysis -Patient underwent serial/back-to-back hemodialysis on 05/05/2023 and 05/06/2023 to address volume status (4.6 L and 3.2 L removed for a total of 7.8 liters or 17.1 Lbs) -Symptoms improved significantly -No hypoxia -Salt and fluid restriction advised  Morbid obesity (HCC) -Body mass index is 53.04 kg/m. -Low-calorie diet, portion control and increase physical activity discussed with patient.  Essential hypertension, benign -Stable, continue PTA medications  Discharge Condition: stable, no hypoxia  Follow UP--- Nephrologist and PCP  Consults obtained -nephrology  Diet and Activity recommendation:  As advised  Discharge Instructions    Discharge Instructions     Call MD for:  difficulty breathing, headache or visual disturbances   Complete by: As directed    Call MD for:  persistant dizziness or light-headedness   Complete by: As directed    Call MD for:  persistant nausea and vomiting   Complete by: As directed    Call MD for:  temperature >100.4   Complete by: As directed    Diet - low sodium heart healthy   Complete by: As directed    Discharge instructions   Complete by: As directed    1)Very low-salt diet advised 2)Weigh yourself daily, call if you gain more than 3 pounds in 1 day or more than 5  pounds in 1 week as your diuretic medications may need to be adjusted 3)Limit your Fluid  intake to no more than 60 ounces (1.8 Liters) per day 4) continue hemodialysis on Tuesdays Thursdays and Saturdays 5)Avoid ibuprofen/Advil/Aleve/Motrin/Goody Powders/Naproxen/BC powders/Meloxicam/Diclofenac/Indomethacin and other Nonsteroidal anti-inflammatory medications as these will make you more likely to bleed and can cause stomach ulcers, can also cause Kidney problems.   Increase activity slowly   Complete by: As directed         Discharge Medications     Allergies as of 05/06/2023       Reactions   Ozempic (0.25 Or 0.5 Mg-dose) [semaglutide(0.25 Or 0.5mg -dos)] Shortness Of Breath   Hydralazine Anxiety   Hallucinations    Lisinopril Swelling        Medication List     TAKE these medications    Accu-Chek Aviva Plus test strip Generic drug: glucose blood Use as instructed TO CHECK BLOOD GLUCOSE THREE TIMES DAILY   acetaminophen 500 MG tablet Commonly known as: TYLENOL Take 1,000 mg by mouth every 6 (six) hours as needed for moderate pain or headache.   albuterol 108 (90 Base) MCG/ACT inhaler Commonly known as: VENTOLIN HFA Inhale 2 puffs into the lungs every 4 (four) hours as needed for wheezing or shortness of breath.   allopurinol 100 MG tablet Commonly known as: ZYLOPRIM Take 2 tablets (200 mg total) by mouth daily.   AMBULATORY NON FORMULARY MEDICATION Inject 0.2 ml by intracavernosal route as directed.  Medication Name: TriMix PGE 10 mcg Pap   30 mg Phent 1 mg   amLODipine 5 MG tablet Commonly known as: NORVASC Take 5 mg by mouth daily.   aspirin EC 81 MG tablet Take 1 tablet (81 mg total) by mouth daily.   atorvastatin 80 MG tablet Commonly known as: LIPITOR Take 1 tablet (80 mg total) by mouth daily.   B-D ULTRAFINE III SHORT PEN 31G X 8 MM Misc Generic drug: Insulin Pen Needle 1 each by Does not apply route as directed.   carvedilol 25 MG  tablet Commonly known as: COREG Take 1 tablet (25 mg total) by mouth 2 (two) times daily with a meal.   cloNIDine 0.3 MG tablet Commonly known as: CATAPRES Take 1 tablet (0.3 mg total) by mouth 3 (three) times daily.   esomeprazole 20 MG capsule Commonly known as: NEXIUM Take 20 mg by mouth daily.   ezetimibe 10 MG tablet Commonly known as: ZETIA Take 10 mg by mouth every evening.   fluticasone 50 MCG/ACT nasal spray Commonly known as: FLONASE Place 1 spray into both nostrils daily as needed for allergies or rhinitis.   furosemide 80 MG tablet Commonly known as: LASIX  Take 80 mg by mouth daily.   gabapentin 100 MG capsule Commonly known as: NEURONTIN Take 1 capsule (100 mg total) by mouth 3 (three) times daily.   HumaLOG KwikPen 100 UNIT/ML KwikPen Generic drug: insulin lispro Inject 8-14 Units into the skin 3 (three) times daily. Sliding scale 30 units TID   Ipratropium-Albuterol 20-100 MCG/ACT Aers respimat Commonly known as: COMBIVENT Inhale 1 puff into the lungs every 6 (six) hours as needed for wheezing or shortness of breath.   Levemir FlexTouch 100 UNIT/ML FlexPen Generic drug: insulin detemir Inject 50 Units into the skin at bedtime.   Linzess 145 MCG Caps capsule Generic drug: linaclotide Take 145 mcg by mouth daily before breakfast.   multivitamin Tabs tablet Take 1 tablet by mouth at bedtime.   sevelamer carbonate 800 MG tablet Commonly known as: RENVELA Take 3 tablets (2,400 mg total) by mouth 3 (three) times daily with meals. What changed: additional instructions        Major procedures and Radiology Reports - PLEASE review detailed and final reports for all details, in brief -   DG Chest Portable 1 View  Result Date: 05/05/2023 CLINICAL DATA:  Shortness of breath and lower extremity edema. EXAM: PORTABLE CHEST 1 VIEW COMPARISON:  Portable chest 01/11/2023. FINDINGS: There is cardiomegaly with mild perihilar vascular congestion, mild  interstitial edema noted new in the interval. There are minimal pleural effusions beginning to form. No acute airspace infiltrate is seen. The mediastinum is normally outlined. There is minimal aortic atherosclerosis. Thoracic spondylosis. IMPRESSION: Cardiomegaly with mild perihilar vascular congestion, mild interstitial edema and minimal pleural effusions consistent with CHF or fluid overload. Electronically Signed   By: Almira Bar M.D.   On: 05/05/2023 04:45    Today   Subjective    Gwin Klich today has no new complaints  - Tolerated back-to-back hemodialysis well  - -Patient underwent serial/back-to-back hemodialysis on 05/05/2023 and 05/06/2023 to address volume status (4.6 L and 3.2 L removed for a total of 7.8 liters or 17.1 Lbs) -Symptoms improved significantly -No hypoxia   Patient has been seen and examined prior to discharge   Objective   Blood pressure (!) 147/89, pulse 74, temperature 98.3 F (36.8 C), temperature source Oral, resp. rate 16, height 5\' 6"  (1.676 m), weight (!) 143 kg, SpO2 100 %.   Intake/Output Summary (Last 24 hours) at 05/06/2023 1835 Last data filed at 05/06/2023 1814 Gross per 24 hour  Intake 480 ml  Output 3200 ml  Net -2720 ml    Exam Gen:- Awake Alert, no acute distress , morbidly obese, speaking in complete sentences HEENT:- [place].AT, No sclera icterus Neck-Supple Neck,No JVD,.  Lungs-  CTAB , good air movement bilaterally CV- S1, S2 normal, regular Abd-  +ve B.Sounds, Abd Soft, No tenderness, increased truncal adiposity Extremity/Skin:-Resolved edema,   good pulses Psych-affect is appropriate, oriented x3 Neuro-no new focal deficits, no tremors  MSK-left upper extremity AV fistula positive thrill and bruit   Data Review   CBC w Diff:  Lab Results  Component Value Date   WBC 6.6 05/06/2023   HGB 10.3 (L) 05/06/2023   HGB 10.5 (L) 06/10/2021   HCT 32.1 (L) 05/06/2023   HCT 31.5 (L) 06/10/2021   PLT 242 05/06/2023   PLT 310  06/10/2021   LYMPHOPCT 21 05/05/2023   MONOPCT 7 05/05/2023   EOSPCT 6 05/05/2023   BASOPCT 1 05/05/2023    CMP:  Lab Results  Component Value Date   NA 132 (L) 05/06/2023  NA 137 06/10/2021   K 4.3 05/06/2023   CL 93 (L) 05/06/2023   CO2 25 05/06/2023   BUN 67 (H) 05/06/2023   BUN 66 (H) 06/10/2021   CREATININE 9.92 (H) 05/06/2023   PROT 7.2 01/12/2023   ALBUMIN 3.5 01/13/2023   BILITOT 0.5 01/12/2023   ALKPHOS 54 01/12/2023   AST 21 01/12/2023   ALT 18 01/12/2023  .  Total Discharge time is about 33 minutes  Shon Hale M.D on 05/06/2023 at 6:35 PM  Go to [email or web address] -  for contact info  [facility] - Office  [phone]

## 2023-05-06 NOTE — Progress Notes (Signed)
   05/06/23 1814  Vitals  Temp 98.3 F (36.8 C)  Temp Source Oral  BP (!) 147/89  BP Location Right Wrist  BP Method Automatic  Patient Position (if appropriate) Lying  Pulse Rate 74  Resp 16  Oxygen Therapy  SpO2 100 %  O2 Device Room Air  During Treatment Monitoring  Intra-Hemodialysis Comments Tx completed  Post Treatment  Dialyzer Clearance Heavily streaked  Duration of HD Treatment -hour(s) 3.25 hour(s)  Hemodialysis Intake (mL) 0 mL  Liters Processed 78  Fluid Removed (mL) 3200 mL  Tolerated HD Treatment Yes  Post-Hemodialysis Comments Pt goal met.  AVG/AVF Arterial Site Held (minutes) 10 minutes  AVG/AVF Venous Site Held (minutes) 10 minutes  Fistula / Graft Left Upper arm Arteriovenous fistula  Placement Date/Time: 11/11/19 0843   Placed prior to admission: No  Orientation: Left  Access Location: Upper arm  Access Type: Arteriovenous fistula  Site Condition No complications  Fistula / Graft Assessment Present;Thrill;Bruit  Status Deaccessed  Needle Size 15  Drainage Description None

## 2023-05-06 NOTE — Care Management Obs Status (Signed)
MEDICARE OBSERVATION STATUS NOTIFICATION   Patient Details  Name: Kelly Key MRN: [id number] Date of Birth: 07-31-1971   Medicare Observation Status Notification Given:  Yes    Lavenia Atlas, RN 05/06/2023, 7:02 PM

## 2023-05-06 NOTE — Discharge Instructions (Signed)
1)Very low-salt diet advised 2)Weigh yourself daily, call if you gain more than 3 pounds in 1 day or more than 5 pounds in 1 week as your diuretic medications may need to be adjusted 3)Limit your Fluid  intake to no more than 60 ounces (1.8 Liters) per day 4) continue hemodialysis on Tuesdays Thursdays and Saturdays 5)Avoid ibuprofen/Advil/Aleve/Motrin/Goody Powders/Naproxen/BC powders/Meloxicam/Diclofenac/Indomethacin and other Nonsteroidal anti-inflammatory medications as these will make you more likely to bleed and can cause stomach ulcers, can also cause Kidney problems.

## 2023-05-06 NOTE — Care Management CC44 (Signed)
Condition Code 44 Documentation Completed  Patient Details  Name: Kelly Key MRN: [id number] Date of Birth: 31-Mar-1971   Condition Code 44 given:  Yes Patient signature on Condition Code 44 notice:  Yes Documentation of 2 MD's agreement:  Yes Code 44 added to claim:  Yes    Lavenia Atlas, RN 05/06/2023, 7:02 PM

## 2023-05-06 NOTE — Progress Notes (Signed)
Mobility Specialist Progress Note:    05/06/23 1155  Mobility  Activity Ambulated independently in hallway  Level of Assistance Standby assist, set-up cues, supervision of patient - no hands on  Assistive Device None  Distance Ambulated (ft) 60 ft  Range of Motion/Exercises Active;All extremities  Activity Response Tolerated well  Mobility Referral Yes  $Mobility charge 1 Mobility  Mobility Specialist Start Time (ACUTE ONLY) 1145  Mobility Specialist Stop Time (ACUTE ONLY) 1155  Mobility Specialist Time Calculation (min) (ACUTE ONLY) 10 min   Pt received sitting EOB, family in room. Agreeable to mobility session. Ambulated in hallway, no AD required, SV for safety. Tolerated well, asx throughout. Returned pt to EOB, all needs met, call bell in reach.    Feliciana Rossetti Mobility Specialist Please contact via Special educational needs teacher or  Rehab office at [phone]

## 2023-05-06 NOTE — Progress Notes (Signed)
Patient slept majority of the shift only waking when vitals taken and medication. Tylenol given once this shift for headache. No further complaints this shift. Continued to monitor.

## 2023-05-11 ENCOUNTER — Encounter: Payer: 59 | Admitting: Pulmonary Disease

## 2023-06-08 ENCOUNTER — Emergency Department (HOSPITAL_COMMUNITY): Payer: 59

## 2023-06-08 ENCOUNTER — Other Ambulatory Visit: Payer: Self-pay

## 2023-06-08 ENCOUNTER — Emergency Department (HOSPITAL_COMMUNITY)
Admission: EM | Admit: 2023-06-08 | Discharge: 2023-06-08 | Disposition: A | Discharge status: Home / Self Care | Payer: 59 | Attending: Emergency Medicine | Admitting: Emergency Medicine

## 2023-06-08 DIAGNOSIS — E1122 Type 2 diabetes mellitus with diabetic chronic kidney disease: Secondary | ICD-10-CM | POA: Insufficient documentation

## 2023-06-08 DIAGNOSIS — I132 Hypertensive heart and chronic kidney disease with heart failure and with stage 5 chronic kidney disease, or end stage renal disease: Secondary | ICD-10-CM | POA: Diagnosis not present

## 2023-06-08 DIAGNOSIS — R0602 Shortness of breath: Secondary | ICD-10-CM | POA: Insufficient documentation

## 2023-06-08 DIAGNOSIS — Z79899 Other long term (current) drug therapy: Secondary | ICD-10-CM | POA: Insufficient documentation

## 2023-06-08 DIAGNOSIS — I509 Heart failure, unspecified: Secondary | ICD-10-CM | POA: Diagnosis not present

## 2023-06-08 DIAGNOSIS — N186 End stage renal disease: Secondary | ICD-10-CM | POA: Diagnosis not present

## 2023-06-08 DIAGNOSIS — Z7982 Long term (current) use of aspirin: Secondary | ICD-10-CM | POA: Diagnosis not present

## 2023-06-08 DIAGNOSIS — Z992 Dependence on renal dialysis: Secondary | ICD-10-CM | POA: Diagnosis not present

## 2023-06-08 DIAGNOSIS — Z794 Long term (current) use of insulin: Secondary | ICD-10-CM | POA: Insufficient documentation

## 2023-06-08 LAB — CBC
HCT: 31.2 % — ABNORMAL LOW (ref 39.0–52.0)
Hemoglobin: 9.8 g/dL — ABNORMAL LOW (ref 13.0–17.0)
MCH: 27.8 pg (ref 26.0–34.0)
MCHC: 31.4 g/dL (ref 30.0–36.0)
MCV: 88.4 fL (ref 80.0–100.0)
Platelets: 192 10*3/uL (ref 150–400)
RBC: 3.53 MIL/uL — ABNORMAL LOW (ref 4.22–5.81)
RDW: 15 % (ref 11.5–15.5)
WBC: 6.9 10*3/uL (ref 4.0–10.5)
nRBC: 0 % (ref 0.0–0.2)

## 2023-06-08 LAB — BASIC METABOLIC PANEL
Anion gap: 16 — ABNORMAL HIGH (ref 5–15)
BUN: 77 mg/dL — ABNORMAL HIGH (ref 6–20)
CO2: 23 mmol/L (ref 22–32)
Calcium: 8.5 mg/dL — ABNORMAL LOW (ref 8.9–10.3)
Chloride: 94 mmol/L — ABNORMAL LOW (ref 98–111)
Creatinine, Ser: 11.4 mg/dL — ABNORMAL HIGH (ref 0.61–1.24)
GFR, Estimated: 5 mL/min — ABNORMAL LOW (ref >60)
Glucose, Bld: 315 mg/dL — ABNORMAL HIGH (ref 70–99)
Potassium: 4.7 mmol/L (ref 3.5–5.1)
Sodium: 133 mmol/L — ABNORMAL LOW (ref 135–145)

## 2023-06-08 NOTE — ED Triage Notes (Signed)
Pt came in via POV d/t fluid overload (per pt) & feeling SOB since last night, denies missing any appointments or CP just endorses SOB. A/Ox4. Schedule: T Th & Sat.

## 2023-06-08 NOTE — ED Provider Notes (Signed)
[place] [facility] AT [facility] Provider Note   CSN: [id number] Arrival date & time: 06/08/23  1802     History  Chief Complaint  Patient presents with   Shortness of Breath    Kelly Key is a 52 y.o. male.  Patient with history of ESRD on hemodialysis Tuesday, Thursday, Saturday, CHF, htn, diabetes, OSA, morbid obesity presents today with complaints of shortness of breath. He states that he feels like he needs to be dialyzed. He was last dialyzed on Saturday and has a scheduled appointment to receive dialysis in the morning. He denies any chest pain, cough, or congestion. He does not know his dry weight. States that his legs and abdomen are swollen but not more so than normal. He states that this has been a chronic problem for him with not feeling like he gets enough fluid taken off at dialysis.  He is not on oxygen.  He does still make some urine  The history is provided by the patient. No language interpreter was used.  Shortness of Breath      Home Medications Prior to Admission medications   Medication Sig Start Date End Date Taking? Authorizing Provider  acetaminophen (TYLENOL) 500 MG tablet Take 1,000 mg by mouth every 6 (six) hours as needed for moderate pain or headache.    [provider]  albuterol (VENTOLIN HFA) 108 (90 Base) MCG/ACT inhaler Inhale 2 puffs into the lungs every 4 (four) hours as needed for wheezing or shortness of breath. 05/06/23   Shon Hale, MD  allopurinol (ZYLOPRIM) 100 MG tablet Take 2 tablets (200 mg total) by mouth daily. 08/04/19   Love, Evlyn Kanner, PA-C  AMBULATORY NON FORMULARY MEDICATION Inject 0.2 ml by intracavernosal route as directed.  Medication Name: TriMix PGE 10 mcg Pap   30 mg Phent 1 mg Patient not taking: Reported on 05/05/2023 01/01/22   Milderd Meager., MD  amLODipine (NORVASC) 5 MG tablet Take 5 mg by mouth daily. 01/30/22   [provider]  aspirin EC 81 MG tablet Take 1 tablet  (81 mg total) by mouth daily. 05/06/23   Shon Hale, MD  atorvastatin (LIPITOR) 80 MG tablet Take 1 tablet (80 mg total) by mouth daily. 05/06/23   Shon Hale, MD  carvedilol (COREG) 25 MG tablet Take 1 tablet (25 mg total) by mouth 2 (two) times daily with a meal. 05/06/23   Emokpae, Courage, MD  cloNIDine (CATAPRES) 0.3 MG tablet Take 1 tablet (0.3 mg total) by mouth 3 (three) times daily. 05/06/23   Shon Hale, MD  esomeprazole (NEXIUM) 20 MG capsule Take 20 mg by mouth daily. 01/23/22   [provider]  ezetimibe (ZETIA) 10 MG tablet Take 10 mg by mouth every evening. 01/20/19   [provider]  fluticasone (FLONASE) 50 MCG/ACT nasal spray Place 1 spray into both nostrils daily as needed for allergies or rhinitis.    [provider]  furosemide (LASIX) 80 MG tablet Take 80 mg by mouth daily.    [provider]  gabapentin (NEURONTIN) 100 MG capsule Take 1 capsule (100 mg total) by mouth 3 (three) times daily. 08/03/19   Love, Evlyn Kanner, PA-C  glucose blood (ACCU-CHEK AVIVA PLUS) test strip Use as instructed TO CHECK BLOOD GLUCOSE THREE TIMES DAILY 08/15/20   Nida, Denman George, MD  HUMALOG KWIKPEN 100 UNIT/ML KwikPen Inject 8-14 Units into the skin 3 (three) times daily. Sliding scale 30 units TID 08/19/22   [provider]  insulin detemir (LEVEMIR FLEXTOUCH) 100 UNIT/ML FlexPen Inject 50 Units into the skin at bedtime. 05/06/23   Emokpae, Courage, MD  Insulin Pen Needle (B-D ULTRAFINE III SHORT PEN) 31G X 8 MM MISC 1 each by Does not apply route as directed. 08/13/20   Roma Kayser, MD  Ipratropium-Albuterol (COMBIVENT) 20-100 MCG/ACT AERS respimat Inhale 1 puff into the lungs every 6 (six) hours as needed for wheezing or shortness of breath. 05/06/23 06/05/23  Shon Hale, MD  linaclotide (LINZESS) 145 MCG CAPS capsule Take 145 mcg by mouth daily before breakfast.    [provider]  multivitamin (RENA-VIT) TABS tablet  Take 1 tablet by mouth at bedtime. 08/03/19   Love, Evlyn Kanner, PA-C  sevelamer carbonate (RENVELA) 800 MG tablet Take 3 tablets (2,400 mg total) by mouth 3 (three) times daily with meals. Patient taking differently: Take 2,400 mg by mouth 3 (three) times daily with meals. And once daily with snacks 08/03/19   Love, Pamela S, PA-C      Allergies    Ozempic (0.25 or 0.5 mg-dose) [semaglutide(0.25 or 0.5mg -dos)], Hydralazine, and Lisinopril    Review of Systems   Review of Systems  Respiratory:  Positive for shortness of breath.   All other systems reviewed and are negative.   Physical Exam Updated Vital Signs BP (!) 153/85 (BP Location: Right Arm)   Pulse 72   Temp 97.6 F (36.4 C) (Oral)   Resp 19   Ht 5\' 7"  (1.702 m)   Wt 136.1 kg   SpO2 96%   BMI 46.99 kg/m  Physical Exam Vitals and nursing note reviewed.  Constitutional:      General: He is not in acute distress.    Appearance: Normal appearance. He is normal weight. He is not ill-appearing, toxic-appearing or diaphoretic.  HENT:     Head: Normocephalic and atraumatic.  Cardiovascular:     Rate and Rhythm: Normal rate and regular rhythm.     Heart sounds: Normal heart sounds.  Pulmonary:     Effort: Pulmonary effort is normal. No respiratory distress.     Breath sounds: Normal breath sounds.  Chest:     Chest wall: No tenderness.  Abdominal:     Palpations: Abdomen is soft.     Tenderness: There is no abdominal tenderness.  Musculoskeletal:        General: Normal range of motion.     Cervical back: Normal range of motion.     Right lower leg: No tenderness.     Left lower leg: No tenderness.     Comments: 1+ pitting edema bilaterally.  No tenderness, erythema, warmth.  DP and PT pulses palpable.  Skin:    General: Skin is warm and dry.  Neurological:     General: No focal deficit present.     Mental Status: He is alert.  Psychiatric:        Mood and Affect: Mood normal.        Behavior: Behavior normal.      ED Results / Procedures / Treatments   Labs (all labs ordered are listed, but only abnormal results are displayed) Labs Reviewed  BASIC METABOLIC PANEL - Abnormal; Notable for the following components:      Result Value   Sodium 133 (*)    Chloride 94 (*)    Glucose, Bld 315 (*)    BUN 77 (*)    Creatinine, Ser 11.40 (*)    Calcium 8.5 (*)    GFR, Estimated 5 (*)  Anion gap 16 (*)    All other components within normal limits  CBC - Abnormal; Notable for the following components:   RBC 3.53 (*)    Hemoglobin 9.8 (*)    HCT 31.2 (*)    All other components within normal limits    EKG EKG Interpretation Date/Time:  Monday June 08 2023 20:25:45 EDT Ventricular Rate:  72 PR Interval:  230 QRS Duration:  110 QT Interval:  425 QTC Calculation: 466 R Axis:   140  Text Interpretation: Sinus rhythm Prolonged PR interval Right axis deviation Probable anteroseptal infarct, old No significant change since last tracing Confirmed by Lorre Nick ([id number]) on 06/08/2023 9:17:40 PM  Radiology DG Chest 2 View  Result Date: 06/08/2023 CLINICAL DATA:  Shortness of breath EXAM: CHEST - 2 VIEW COMPARISON:  05/05/2023 FINDINGS: Lungs are clear.  No pleural effusion or pneumothorax. The heart is normal in size. Mild degenerative changes of the visualized thoracolumbar spine. IMPRESSION: Normal chest radiographs. Electronically Signed   By: Charline Bills M.D.   On: 06/08/2023 19:22    Procedures Procedures    Medications Ordered in ED Medications - No data to display  ED Course/ Medical Decision Making/ A&P                                 Medical Decision Making Amount and/or Complexity of Data Reviewed Labs: ordered. Radiology: ordered.   This patient is a 52 y.o. male who presents to the ED for concern of shortness of breath, this involves an extensive number of treatment options, and is a complaint that carries with it a high risk of complications and morbidity. The  emergent differential diagnosis prior to evaluation includes, but is not limited to,  CHF, pericardial effusion/tamponade, arrhythmias, ACS, COPD, asthma, bronchitis, pneumonia, pneumothorax, PE, anemia  This is not an exhaustive differential.   Past Medical History / Co-morbidities / Social History: history of ESRD on hemodialysis Tuesday, Thursday, Saturday, CHF, htn, diabetes, OSA, morbid obesity  Social determinants: Poor health literacy  Additional history: Chart reviewed. Pertinent results include: patient admitted for similar complaints previously after having vascular congestion on chest x-ray and hyperkalemia on labs.  Has required emergent dialysis previously.  Physical Exam: Physical exam performed. The pertinent findings include: per above, no significant physical exam findings. Patient speaking in complete sentences on room air.  Lab Tests: I ordered, and personally interpreted labs.  The pertinent results include:  hgb 9.8, Na 133, chloride 94. K 4.7. glucose 315, kidney function at baseline. Anion gap 16 likely due to ESRD   Imaging Studies: I ordered imaging studies including CXR. I independently visualized and interpreted imaging which showed NAD. I agree with the radiologist interpretation.   Cardiac Monitoring:  The patient was maintained on a cardiac monitor.  My attending physician Dr. Freida Busman viewed and interpreted the cardiac monitored which showed an underlying rhythm of: no STEMI. I agree with this interpretation.   Disposition: After consideration of the diagnostic results and the patients response to treatment, I feel that emergency department workup does not suggest an emergent condition requiring admission or immediate intervention beyond what has been performed at this time. The plan is: Discharge with plans for dialysis that is already scheduled appointment first thing in the morning.  Patient has no laboratory abnormalities to warrant emergent dialysis tonight.   His x-ray does not show any vascular congestion.  He is able to walk throughout  the department without any drop in his oxygen saturation, increased work of breathing, or tachycardia.  He does not have any signs or symptoms to suggest PE or ACS.  Evaluation and diagnostic testing in the emergency department does not suggest an emergent condition requiring admission or immediate intervention beyond what has been performed at this time.  Plan for discharge with close PCP follow-up.  Patient is understanding and amenable with plan, educated on red flag symptoms that would prompt immediate return.  Patient discharged in stable condition.   I discussed this case with my attending physician Dr. Freida Busman who cosigned this note including patient's presenting symptoms, physical exam, and planned diagnostics and interventions. Attending physician stated agreement with plan or made changes to plan which were implemented.    Final Clinical Impression(s) / ED Diagnoses Final diagnoses:  Shortness of breath    Rx / DC Orders ED Discharge Orders     None     An After Visit Summary was printed and given to the patient.     Vear Clock 06/08/23 2321    Lorre Nick, MD 06/09/23 [phone]

## 2023-06-08 NOTE — Discharge Instructions (Signed)
As we discussed, your workup in the ER today was reassuring for acute findings.  Chest x-ray did not show any fluid and laboratory evaluation did not show any electrolyte derangements.  You were also able to walk around without any drop in your oxygen saturation and therefore there is no indication to emergently dialyze you this evening when you have an appointment for tomorrow morning.  Please go to your appointment tomorrow morning.  You are having issues with this dialysis center I recommend pursuing other places to have your dialysis performed.  Please discuss this with your doctor.  Return if development of any new or worsening symptoms.

## 2023-06-08 NOTE — ED Notes (Signed)
Pt ambulated well to drs box and back With no assistance. No compaints of dizziness. O2 sats steady at 98%-100%. Pt complained of shortness of breath when returned to bed and sat down.

## 2023-06-09 ENCOUNTER — Encounter: Payer: 59 | Admitting: Pulmonary Disease

## 2023-06-12 ENCOUNTER — Institutional Professional Consult (permissible substitution): Payer: 59 | Admitting: Pulmonary Disease

## 2023-06-18 NOTE — Progress Notes (Addendum)
[facility] - Clinic Note  06/24/2023   CHIEF COMPLAINT Patient presents for Retina Evaluation  HISTORY OF PRESENT ILLNESS: Kelly Key is a 52 y.o. male who presents to the clinic today for:  HPI     Retina Evaluation   In both eyes.  This started 15 years ago.  Associated Symptoms Blind Spot.  Negative for Flashes and Floaters.  Context:  distance vision, near vision, reading and watching TV.  I, the attending physician,  performed the HPI with the patient and updated documentation appropriately.        Comments   Patient is here today based on a referral from Dr. Einar Pheasant [facility]) for a diabetic eval. He states that the vision in the right eye has gone out twice for a period of a few minutes and has come back. He is not using eye drops. He does not check his blood sugar daily.       Last edited by Rennis Chris, MD on 06/24/2023 12:37 PM.    7.4 on 12.23.23, max A1c was 12.4 on 08.26.24  Referring physician: [facility] [address] [place],  [place] [address]  HISTORICAL INFORMATION:  Selected notes from the MEDICAL RECORD NUMBER Referred by Dr. Einar Pheasant ([facility] [place]) for diabetic retinopathy LEE:  Ocular Hx- PMH-   CURRENT MEDICATIONS: No current outpatient medications on file. (Ophthalmic Drugs)   No current facility-administered medications for this visit. (Ophthalmic Drugs)   Current Outpatient Medications (Other)  Medication Sig   acetaminophen (TYLENOL) 500 MG tablet Take 1,000 mg by mouth every 6 (six) hours as needed for moderate pain or headache.   albuterol (VENTOLIN HFA) 108 (90 Base) MCG/ACT inhaler Inhale 2 puffs into the lungs every 4 (four) hours as needed for wheezing or shortness of breath.   allopurinol (ZYLOPRIM) 100 MG tablet Take 2 tablets (200 mg total) by mouth daily.   AMBULATORY NON FORMULARY MEDICATION Inject 0.2 ml by intracavernosal route as  directed.  Medication Name: TriMix PGE 10 mcg Pap   30 mg Phent 1 mg (Patient not taking: Reported on 05/05/2023)   amLODipine (NORVASC) 5 MG tablet Take 5 mg by mouth daily.   aspirin EC 81 MG tablet Take 1 tablet (81 mg total) by mouth daily.   atorvastatin (LIPITOR) 80 MG tablet Take 1 tablet (80 mg total) by mouth daily.   carvedilol (COREG) 25 MG tablet Take 1 tablet (25 mg total) by mouth 2 (two) times daily with a meal.   cloNIDine (CATAPRES) 0.3 MG tablet Take 1 tablet (0.3 mg total) by mouth 3 (three) times daily.   esomeprazole (NEXIUM) 20 MG capsule Take 20 mg by mouth daily.   ezetimibe (ZETIA) 10 MG tablet Take 10 mg by mouth every evening.   fluticasone (FLONASE) 50 MCG/ACT nasal spray Place 1 spray into both nostrils daily as needed for allergies or rhinitis.   furosemide (LASIX) 80 MG tablet Take 80 mg by mouth daily.   gabapentin (NEURONTIN) 100 MG capsule Take 1 capsule (100 mg total) by mouth 3 (three) times daily.   glucose blood (ACCU-CHEK AVIVA PLUS) test strip Use as instructed TO CHECK BLOOD GLUCOSE THREE TIMES DAILY   HUMALOG KWIKPEN 100 UNIT/ML KwikPen Inject 8-14 Units into the skin 3 (three) times daily. Sliding scale 30 units TID   insulin detemir (LEVEMIR FLEXTOUCH) 100 UNIT/ML FlexPen Inject 50 Units into the skin at bedtime.   Insulin Pen Needle (B-D ULTRAFINE III  SHORT PEN) 31G X 8 MM MISC 1 each by Does not apply route as directed.   Ipratropium-Albuterol (COMBIVENT) 20-100 MCG/ACT AERS respimat Inhale 1 puff into the lungs every 6 (six) hours as needed for wheezing or shortness of breath.   linaclotide (LINZESS) 145 MCG CAPS capsule Take 145 mcg by mouth daily before breakfast.   multivitamin (RENA-VIT) TABS tablet Take 1 tablet by mouth at bedtime.   sevelamer carbonate (RENVELA) 800 MG tablet Take 3 tablets (2,400 mg total) by mouth 3 (three) times daily with meals. (Patient taking differently: Take 2,400 mg by mouth 3 (three) times daily with meals. And once  daily with snacks)   Current Facility-Administered Medications (Other)  Medication Route   0.9 %  sodium chloride infusion Intravenous   REVIEW OF SYSTEMS: ROS   Positive for: Gastrointestinal, Endocrine, Eyes, Respiratory Last edited by Charlette Caffey, COT on 06/24/2023  9:17 AM.     ALLERGIES Allergies  Allergen Reactions   Ozempic (0.25 Or 0.5 Mg-Dose) [Semaglutide(0.25 Or 0.5mg -Dos)] Shortness Of Breath   Hydralazine Anxiety    Hallucinations    Lisinopril Swelling   PAST MEDICAL HISTORY Past Medical History:  Diagnosis Date   Acute diastolic CHF (congestive heart failure) (HCC) 06/11/2012   EF 50-55% [facility])   Chronic kidney disease    COPD (chronic obstructive pulmonary disease) (HCC)    Diabetes mellitus    A1c 11.5 (06/11/2012).   Dyspnea    Gout    Hepatic steatosis 06/11/2012   Elevated LFTs   Hyperlipemia    Malignant hypertension    Microcytic anemia 06/12/2012   Obesity    Past Surgical History:  Procedure Laterality Date   ABDOMINAL AORTOGRAM W/LOWER EXTREMITY N/A 12/01/2022   Procedure: ABDOMINAL AORTOGRAM W/LOWER EXTREMITY;  Surgeon: Cephus Shelling, MD;  Location: [facility] INVASIVE CV LAB;  Service: Cardiovascular;  Laterality: N/A;   AMPUTATION Left 12/03/2022   Procedure: LEFT SECOND TOE AMPUTATION;  Surgeon: Nadara Mustard, MD;  Location: [facility] OR;  Service: Orthopedics;  Laterality: Left;   AV FISTULA PLACEMENT Left 11/11/2019   Procedure: ARTERIOVENOUS (AV) FISTULA CREATION LEFT BRACHIOCEPHALIC ARM;  Surgeon: Nada Libman, MD;  Location: [facility] OR;  Service: Vascular;  Laterality: Left;   COLONOSCOPY WITH PROPOFOL N/A 09/02/2021   Procedure: COLONOSCOPY WITH PROPOFOL;  Surgeon: Wyline Mood, MD;  Location: [facility] ENDOSCOPY;  Service: Gastroenterology;  Laterality: N/A;   IR FLUORO GUIDE CV LINE RIGHT  07/08/2019   IR REMOVAL TUN CV CATH W/O FL  03/23/2020   IR US GUIDE VASC ACCESS RIGHT  07/08/2019   LEFT HEART CATH AND CORONARY ANGIOGRAPHY N/A  06/12/2021   Procedure: LEFT HEART CATH AND CORONARY ANGIOGRAPHY;  Surgeon: Lyn Records, MD;  Location: [facility] INVASIVE CV LAB;  Service: Cardiovascular;  Laterality: N/A;   NO PAST SURGERIES     PERIPHERAL VASCULAR BALLOON ANGIOPLASTY Left 12/01/2022   Procedure: PERIPHERAL VASCULAR BALLOON ANGIOPLASTY;  Surgeon: Cephus Shelling, MD;  Location: [facility] INVASIVE CV LAB;  Service: Cardiovascular;  Laterality: Left;  Anterior tibial   PERIPHERAL VASCULAR INTERVENTION Left 12/01/2022   Procedure: PERIPHERAL VASCULAR INTERVENTION;  Surgeon: Cephus Shelling, MD;  Location: [facility] INVASIVE CV LAB;  Service: Cardiovascular;  Laterality: Left;  sfa   VASCULAR SURGERY     FAMILY HISTORY Family History  Problem Relation Age of Onset   Gout Mother    Asthma Mother    Diabetes Father    Heart failure Father    Diabetes Sister  Hypertension Brother    Pancreatic cancer Brother    Diabetes Sister    SOCIAL HISTORY Social History   Tobacco Use   Smoking status: Never    Passive exposure: Past   Smokeless tobacco: Never  Vaping Use   Vaping status: Never Used  Substance Use Topics   Alcohol use: Not Currently    Comment: haven't drank in over 5 years    Drug use: No       OPHTHALMIC EXAM:  Base Eye Exam     Visual Acuity (Snellen - Linear)       Right Left   Dist cc 20/50 +1 20/20   Dist ph cc NI     Correction: Glasses         Tonometry (Tonopen, 9:25 AM)       Right Left   Pressure 20 22         Pupils       Dark Light Shape React APD   Right 2 1 Round Brisk None   Left 2 1 Round Brisk None         Visual Fields       Left Right    Full Full         Extraocular Movement       Right Left    Full, Ortho Full, Ortho         Neuro/Psych     Oriented x3: Yes         Dilation     Both eyes: 1.0% Mydriacyl, 2.5% Phenylephrine @ 9:18 AM           Slit Lamp and Fundus Exam     External Exam       Right Left   External Normal Normal          Slit Lamp Exam       Right Left   Lids/Lashes Dermatochalasis - upper lid Dermatochalasis - upper lid   Conjunctiva/Sclera Mild Melanosis Mild Melanosis   Cornea Arcus, mild Debris in tear film Arcus, mild Debris in tear film   Anterior Chamber Deep and clear Deep and clear   Iris Round and moderately dilated, No NVI Round and moderately dilated, No NVI   Lens 2+ Nuclear sclerosis, 2+ Cortical cataract 2+ Nuclear sclerosis, 2+ Cortical cataract   Anterior Vitreous Vitreous syneresis Vitreous syneresis         Fundus Exam       Right Left   Disc Pink and sharp, mild PPA Pink and sharp, mild PPA   C/D Ratio 0.5 0.5   Macula Flat, Blunted foveal reflex, central cystic changes, scattered MA/DBH Flat, Blunted foveal reflex, central cystic changes, scattered MA/DBH   Vessels Vascular attenuation, Tortuous, AV crossing changes, Copper wiring Vascular attenuation, Tortuous, AV crossing changes, Copper wiring   Periphery Attached, 360 MA/DBH, No edema Attached, 360 MA/DBH, No edema           Refraction     Wearing Rx       Sphere Cylinder Axis   Right -0.25 +1.50 020   Left [place] +1.25 154           IMAGING AND PROCEDURES  Imaging and Procedures for 06/24/2023  OCT, Retina - OU - Both Eyes       Right Eye Quality was good. Central Foveal Thickness: 396. Progression has no prior data. Findings include abnormal foveal contour, intraretinal hyper-reflective material, vitreomacular adhesion (Central edema w/ central IRF/SRF greatest nasal fovea).  Left Eye Central Foveal Thickness: 289. Progression has no prior data. Findings include no SRF, abnormal foveal contour, intraretinal hyper-reflective material, vitreomacular adhesion (Focal IRF/edema temporal macula).   Notes *Images captured and stored on drive  Diagnosis / Impression:  +DME OU (OD > OS) OD: Central edema w/ central IRF/SRF greatest nasal fovea OS: Focal IRF/edema temporal macula  Clinical  management:  See below  Abbreviations: NFP - Normal foveal profile. CME - cystoid macular edema. PED - pigment epithelial detachment. IRF - intraretinal fluid. SRF - subretinal fluid. EZ - ellipsoid zone. ERM - epiretinal membrane. ORA - outer retinal atrophy. ORT - outer retinal tubulation. SRHM - subretinal hyper-reflective material. IRHM - intraretinal hyper-reflective material      Fluorescein Angiography Optos (Transit OD)       Right Eye Progression has no prior data. Early phase findings include microaneurysm, vascular perfusion defect (Mild patches of capillary non profusion peripherally). Mid/Late phase findings include leakage, staining, microaneurysm, vascular perfusion defect (Late leaking MA, hyperfluorescence of disc, focal perifoveal hyperfluorescence leakage, no NV).   Left Eye Progression has no prior data. Early phase findings include microaneurysm, vascular perfusion defect (Mild patches of capillary non profusion peripherally). Mid/Late phase findings include leakage, staining, microaneurysm, vascular perfusion defect (Late leaking MA, no NV).   Notes *Images captured and stored on drive  Diagnosis / Impression:  OD: Mild patches of capillary non profusion peripherally, Late leaking MA, hyperfluorescence of disc, focal perifoveal hyperfluorescent leakage, no NV OS: Mild patches of capillary non profusion peripherally, Late leaking MA,  no NV  Clinical management:  See below  Abbreviations: NFP - Normal foveal profile. CME - cystoid macular edema. PED - pigment epithelial detachment. IRF - intraretinal fluid. SRF - subretinal fluid. EZ - ellipsoid zone. ERM - epiretinal membrane. ORA - outer retinal atrophy. ORT - outer retinal tubulation. SRHM - subretinal hyper-reflective material. IRHM - intraretinal hyper-reflective material      Intravitreal Injection, Pharmacologic Agent - OD - Right Eye       Time Out 06/24/2023. 11:03 AM. Confirmed correct patient,  procedure, site, and patient consented.   Anesthesia Topical anesthesia was used. Anesthetic medications included Lidocaine 2%, Proparacaine 0.5%.   Procedure Preparation included 5% betadine to ocular surface, eyelid speculum. A supplied needle was used.   Injection: 1.25 mg Bevacizumab 1.25mg /0.12ml   Route: Intravitreal, Site: Right Eye   NDC: [id number], Lot: [id number] , Expiration date: 07/11/2023   Post-op Post injection exam found visual acuity of at least counting fingers. The patient tolerated the procedure well. There were no complications. The patient received written and verbal post procedure care education.           ASSESSMENT/PLAN:   ICD-10-CM   1. Severe nonproliferative diabetic retinopathy of both eyes with macular edema associated with type 2 diabetes mellitus (HCC)  E11.3413 OCT, Retina - OU - Both Eyes    Fluorescein Angiography Optos (Transit OD)    Intravitreal Injection, Pharmacologic Agent - OD - Right Eye    Bevacizumab (AVASTIN) SOLN 1.25 mg    2. Encounter for long-term (current) use of insulin (HCC)  Z79.4     3. Essential hypertension  I10     4. Hypertensive retinopathy of both eyes  H35.033 Fluorescein Angiography Optos (Transit OD)    5. Cortical age-related cataract of both eyes  H25.013      1,2. severe Non-proliferative diabetic retinopathy, both eyes - last A1c 7.4 (12.23.24) - The incidence, risk factors for progression, natural history and treatment options  for diabetic retinopathy were discussed with patient.   - The need for close monitoring of blood glucose, blood pressure, and serum lipids, avoiding cigarette or any type of tobacco, and the need for long term follow up was also discussed with patient.  - BCVA 20/50 OD, 20/20 OS - FA today (08.28.24) OD: Mild patches of capillary non profusion peripherally, Late leaking MA, hyperfluorescence of disc, focal perifoveal hyperfluorescence leakage, no NV, OS: Mild patches of capillary non  profusion peripherally, Late leaking MA, no NV - OCT shows OD: Central edema w/ central IRF/SRF greatest nasal fovea; OS: Focal IRF/edema temporal macula - The natural history, pathology, and characteristics of diabetic macular edema discussed with patient.  A generalized discussion of the major clinical trials concerning treatment of diabetic macular edema (ETDRS, DCT, SCORE, RISE / RIDE, and ongoing DRCR net studies) was completed.  This discussion included mention of the various approaches to treating diabetic macular edema (observation, laser photocoagulation, anti-VEGF injections with lucentis / Avastin / Eylea, steroid injections with Kenalog / Ozurdex, and intraocular surgery with vitrectomy).  The goal hemoglobin A1C of 6-7 was discussed, as well as importance of smoking cessation and hypertension control.  Need for ongoing treatment and monitoring were specifically discussed with reference to chronic nature of diabetic macular edema. - recommend IVA #1 OD today 08.28.24 for DME - pt wishes to proceed - RBA of procedure discussed, questions answered - informed consent obtained and signed 08.28.24 (OU) - see procedure note - f/u in 4 wks -- DFE/OCT, possible injection   3,4. Hypertensive retinopathy OU - discussed importance of tight BP control - monitor   5.  Mixed Cataract OU - The symptoms of cataract, surgical options, and treatments and risks were discussed with patient. - discussed diagnosis and progression - monitor   Ophthalmic Meds Ordered this visit:  Meds ordered this encounter  Medications   Bevacizumab (AVASTIN) SOLN 1.25 mg     Return in about 4 weeks (around 07/22/2023) for f/u severe NPDR OU, DFE, OCT, Possible, IVA, OD.  There are no Patient Instructions on file for this visit.  Explained the diagnoses, plan, and follow up with the patient and they expressed understanding.  Patient expressed understanding of the importance of proper follow up care.   This  document serves as a record of services personally performed by Karie Chimera, MD, PhD. It was created on their behalf by De Blanch, an ophthalmic technician. The creation of this record is the provider's dictation and/or activities during the visit.    Electronically signed by: De Blanch, OA, 06/24/23  12:43 PM  This document serves as a record of services personally performed by Karie Chimera, MD, PhD. It was created on their behalf by Laurey Morale, COT an ophthalmic technician. The creation of this record is the provider's dictation and/or activities during the visit.    Electronically signed by:  Charlette Caffey, COT  06/24/23 12:43 PM  Karie Chimera, M.D., Ph.D. Diseases & Surgery of the Retina and Vitreous [facility] 06/24/2023  I have reviewed the above documentation for accuracy and completeness, and I agree with the above. Karie Chimera, M.D., Ph.D. 06/24/23 12:43 PM   Abbreviations: M myopia (nearsighted); A astigmatism; H hyperopia (farsighted); P presbyopia; Mrx spectacle prescription;  CTL contact lenses; OD right eye; OS left eye; OU both eyes  XT exotropia; ET esotropia; PEK punctate epithelial keratitis; PEE punctate epithelial erosions; DES dry eye syndrome; MGD meibomian gland dysfunction;  ATs artificial tears; PFAT's preservative free artificial tears; NSC nuclear sclerotic cataract; PSC posterior subcapsular cataract; ERM epi-retinal membrane; PVD posterior vitreous detachment; RD retinal detachment; DM diabetes mellitus; DR diabetic retinopathy; NPDR non-proliferative diabetic retinopathy; PDR proliferative diabetic retinopathy; CSME clinically significant macular edema; DME diabetic macular edema; dbh dot blot hemorrhages; CWS cotton wool spot; POAG primary open angle glaucoma; C/D cup-to-disc ratio; HVF humphrey visual field; GVF goldmann visual field; OCT optical coherence tomography; IOP intraocular pressure; BRVO Branch  retinal vein occlusion; CRVO central retinal vein occlusion; CRAO central retinal artery occlusion; BRAO branch retinal artery occlusion; RT retinal tear; SB scleral buckle; PPV pars plana vitrectomy; VH Vitreous hemorrhage; PRP panretinal laser photocoagulation; IVK intravitreal kenalog; VMT vitreomacular traction; MH Macular hole;  NVD neovascularization of the disc; NVE neovascularization elsewhere; AREDS age related eye disease study; ARMD age related macular degeneration; POAG primary open angle glaucoma; EBMD epithelial/anterior basement membrane dystrophy; ACIOL anterior chamber intraocular lens; IOL intraocular lens; PCIOL posterior chamber intraocular lens; Phaco/IOL phacoemulsification with intraocular lens placement; PRK photorefractive keratectomy; LASIK laser assisted in situ keratomileusis; HTN hypertension; DM diabetes mellitus; COPD chronic obstructive pulmonary disease

## 2023-06-24 ENCOUNTER — Encounter (INDEPENDENT_AMBULATORY_CARE_PROVIDER_SITE_OTHER): Payer: Self-pay | Admitting: Ophthalmology

## 2023-06-24 ENCOUNTER — Ambulatory Visit (INDEPENDENT_AMBULATORY_CARE_PROVIDER_SITE_OTHER): Payer: 59 | Admitting: Ophthalmology

## 2023-06-24 DIAGNOSIS — Z794 Long term (current) use of insulin: Secondary | ICD-10-CM

## 2023-06-24 DIAGNOSIS — I10 Essential (primary) hypertension: Secondary | ICD-10-CM

## 2023-06-24 DIAGNOSIS — H35033 Hypertensive retinopathy, bilateral: Secondary | ICD-10-CM | POA: Diagnosis not present

## 2023-06-24 DIAGNOSIS — H25013 Cortical age-related cataract, bilateral: Secondary | ICD-10-CM

## 2023-06-24 DIAGNOSIS — E113413 Type 2 diabetes mellitus with severe nonproliferative diabetic retinopathy with macular edema, bilateral: Secondary | ICD-10-CM

## 2023-06-24 DIAGNOSIS — H3581 Retinal edema: Secondary | ICD-10-CM

## 2023-06-24 MED ORDER — BEVACIZUMAB CHEMO INJECTION 1.25MG/0.05ML SYRINGE FOR KALEIDOSCOPE
1.2500 mg | INTRAVITREAL | Status: AC | PRN
Start: 2023-06-24 — End: 2023-06-24
  Administered 2023-06-24: 1.25 mg via INTRAVITREAL

## 2023-07-01 ENCOUNTER — Ambulatory Visit: Payer: 59 | Admitting: Urology

## 2023-07-01 ENCOUNTER — Encounter: Payer: Self-pay | Admitting: Urology

## 2023-07-01 VITALS — BP 152/93 | HR 77

## 2023-07-01 DIAGNOSIS — N529 Male erectile dysfunction, unspecified: Secondary | ICD-10-CM | POA: Diagnosis not present

## 2023-07-01 MED ORDER — AMBULATORY NON FORMULARY MEDICATION
0 refills | Status: DC
Start: 2023-07-01 — End: 2024-08-04

## 2023-07-01 MED ORDER — AMBULATORY NON FORMULARY MEDICATION
0 refills | Status: DC
Start: 2023-07-01 — End: 2023-07-01

## 2023-07-01 NOTE — Patient Instructions (Signed)
Erectile Dysfunction Erectile dysfunction (ED) is the inability to get or keep an erection in order to have sexual intercourse. ED is considered a symptom of an underlying disorder and is not considered a disease. ED may include: Inability to get an erection. Lack of enough hardness of the erection to allow penetration. Loss of erection before sex is finished. What are the causes? This condition may be caused by: Physical causes, such as: Artery problems. This may include heart disease, high blood pressure, atherosclerosis, and diabetes. Hormonal problems, such as low testosterone. Obesity. Nerve problems. This may include back or pelvic injuries, multiple sclerosis, Parkinson's disease, spinal cord injury, and stroke. Certain medicines, such as: Pain relievers. Antidepressants. Blood pressure medicines and water pills (diuretics). Cancer medicines. Antihistamines. Muscle relaxants. Lifestyle factors, such as: Use of drugs such as marijuana, cocaine, or opioids. Excessive use of alcohol. Smoking. Lack of physical activity or exercise. Psychological causes, such as: Anxiety or stress. Sadness or depression. Exhaustion. Fear about sexual performance. Guilt. What are the signs or symptoms? Symptoms of this condition include: Inability to get an erection. Lack of enough hardness of the erection to allow penetration. Loss of the erection before sex is finished. Sometimes having normal erections, but with frequent unsatisfactory episodes. Low sexual satisfaction in either partner due to erection problems. A curved penis occurring with erection. The curve may cause pain, or the penis may be too curved to allow for intercourse. Never having nighttime or morning erections. How is this diagnosed? This condition is often diagnosed by: Performing a physical exam to find other diseases or specific problems with the penis. Asking you detailed questions about the problem. Doing tests,  such as: Blood tests to check for diabetes mellitus or high cholesterol, or to measure hormone levels. Other tests to check for underlying health conditions. An ultrasound exam to check for scarring. A test to check blood flow to the penis. Doing a sleep study at home to measure nighttime erections. How is this treated? This condition may be treated by: Medicines, such as: Medicine taken by mouth to help you achieve an erection (oral medicine). Hormone replacement therapy to replace low testosterone levels. Medicine that is injected into the penis. Your health care provider may instruct you how to give yourself these injections at home. Medicine that is delivered with a short applicator tube. The tube is inserted into the opening at the tip of the penis, which is the opening of the urethra. A tiny pellet of medicine is put in the urethra. The pellet dissolves and enhances erectile function. This is also called MUSE (medicated urethral system for erections) therapy. Vacuum pump. This is a pump with a ring on it. The pump and ring are placed on the penis and used to create pressure that helps the penis become erect. Penile implant surgery. In this procedure, you may receive: An inflatable implant. This consists of cylinders, a pump, and a reservoir. The cylinders can be inflated with a fluid that helps to create an erection, and they can be deflated after intercourse. A semi-rigid implant. This consists of two silicone rubber rods. The rods provide some rigidity. They are also flexible, so the penis can both curve downward in its normal position and become straight for sexual intercourse. Blood vessel surgery to improve blood flow to the penis. During this procedure, a blood vessel from a different part of the body is placed into the penis to allow blood to flow around (bypass) damaged or blocked blood vessels. Lifestyle changes,   such as exercising more, losing weight, and quitting smoking. Follow  these instructions at home: Medicines  Take over-the-counter and prescription medicines only as told by your health care provider. Do not increase the dosage without first discussing it with your health care provider. If you are using self-injections, do injections as directed by your health care provider. Make sure you avoid any veins that are on the surface of the penis. After giving an injection, apply pressure to the injection site for 5 minutes. Talk to your health care provider about how to prevent headaches while taking ED medicines. These medicines may cause a sudden headache due to the increase in blood flow in your body. General instructions Exercise regularly, as directed by your health care provider. Work with your health care provider to lose weight, if needed. Do not use any products that contain nicotine or tobacco. These products include cigarettes, chewing tobacco, and vaping devices, such as e-cigarettes. If you need help quitting, ask your health care provider. Before using a vacuum pump, read the instructions that come with the pump and discuss any questions with your health care provider. Keep all follow-up visits. This is important. Contact a health care provider if: You feel nauseous. You are vomiting. You get sudden headaches while taking ED medicines. You have any concerns about your sexual health. Get help right away if: You are taking oral or injectable medicines and you have an erection that lasts longer than 4 hours. If your health care provider is unavailable, go to the nearest emergency room for evaluation. An erection that lasts much longer than 4 hours can result in permanent damage to your penis. You have severe pain in your groin or abdomen. You develop redness or severe swelling of your penis. You have redness spreading at your groin or lower abdomen. You are unable to urinate. You experience chest pain or a rapid heartbeat (palpitations) after taking oral  medicines. These symptoms may represent a serious problem that is an emergency. Do not wait to see if the symptoms will go away. Get medical help right away. Call your local emergency services (911 in the [place] Do not drive yourself to the hospital. Summary Erectile dysfunction (ED) is the inability to get or keep an erection during sexual intercourse. This condition is diagnosed based on a physical exam, your symptoms, and tests to determine the cause. Treatment varies depending on the cause and may include medicines, hormone therapy, surgery, or a vacuum pump. You may need follow-up visits to make sure that you are using your medicines or devices correctly. Get help right away if you are taking or injecting medicines and you have an erection that lasts longer than 4 hours. This information is not intended to replace advice given to you by your health care provider. Make sure you discuss any questions you have with your health care provider. Document Revised: 01/09/2021 Document Reviewed: 01/09/2021 Elsevier Patient Education  2024 [employer]  

## 2023-07-01 NOTE — Addendum Note (Signed)
Addended byGustavus Messing on: 07/01/2023 10:38 AM   Modules accepted: Orders

## 2023-07-01 NOTE — Progress Notes (Signed)
07/01/2023 10:14 AM   Nelma Rothman Marcellina Millin 14-Aug-1971 [id number]  Referring provider: Pearson Grippe, MD [address] [place],  [place] [address]  Erectile dysfunction    HPI: Mr Seraphin is a 52yo here for evaluation of erectile dysfunction. He was previously seen by Dr. Pete Glatter and was on  trimix 0.40ml prn. He got a fair erection with 0.21ml and has an aching feeling in his penis after the injection. He has tried and failed sildenafil, tadalafil and vardenafil.    PMH: Past Medical History:  Diagnosis Date   Acute diastolic CHF (congestive heart failure) (HCC) 06/11/2012   EF 50-55% [facility])   Chronic kidney disease    COPD (chronic obstructive pulmonary disease) (HCC)    Diabetes mellitus    A1c 11.5 (06/11/2012).   Dyspnea    Gout    Hepatic steatosis 06/11/2012   Elevated LFTs   Hyperlipemia    Malignant hypertension    Microcytic anemia 06/12/2012   Obesity     Surgical History: Past Surgical History:  Procedure Laterality Date   ABDOMINAL AORTOGRAM W/LOWER EXTREMITY N/A 12/01/2022   Procedure: ABDOMINAL AORTOGRAM W/LOWER EXTREMITY;  Surgeon: Cephus Shelling, MD;  Location: [facility] INVASIVE CV LAB;  Service: Cardiovascular;  Laterality: N/A;   AMPUTATION Left 12/03/2022   Procedure: LEFT SECOND TOE AMPUTATION;  Surgeon: Nadara Mustard, MD;  Location[facility];  Service: Orthopedics;  Laterality: Left;   AV FISTULA PLACEMENT Left 11/11/2019   Procedure: ARTERIOVENOUS (AV) FISTULA CREATION LEFT BRACHIOCEPHALIC ARM;  Surgeon: Nada Libman, MD;  Location: [facility] OR;  Service: Vascular;  Laterality: Left;   COLONOSCOPY WITH PROPOFOL N/A 09/02/2021   Procedure: COLONOSCOPY WITH PROPOFOL;  Surgeon: Wyline Mood, MD;  Location: [facility] ENDOSCOPY;  Service: Gastroenterology;  Laterality: N/A;   IR FLUORO GUIDE CV LINE RIGHT  07/08/2019   IR REMOVAL TUN CV CATH W/O FL  03/23/2020   IR US GUIDE VASC ACCESS RIGHT  07/08/2019   LEFT HEART CATH AND CORONARY ANGIOGRAPHY N/A 06/12/2021    Procedure: LEFT HEART CATH AND CORONARY ANGIOGRAPHY;  Surgeon: Lyn Records, MD;  Location: [facility];  Service: Cardiovascular;  Laterality: N/A;   NO PAST SURGERIES     PERIPHERAL VASCULAR BALLOON ANGIOPLASTY Left 12/01/2022   Procedure: PERIPHERAL VASCULAR BALLOON ANGIOPLASTY;  Surgeon: Cephus Shelling, MD;  Location: [facility];  Service: Cardiovascular;  Laterality: Left;  Anterior tibial   PERIPHERAL VASCULAR INTERVENTION Left 12/01/2022   Procedure: PERIPHERAL VASCULAR INTERVENTION;  Surgeon: Cephus Shelling, MD;  Location: [facility];  Service: Cardiovascular;  Laterality: Left;  sfa   VASCULAR SURGERY      Home Medications:  Allergies as of 07/01/2023       Reactions   Ozempic (0.25 Or 0.5 Mg-dose) [semaglutide(0.25 Or 0.5mg -dos)] Shortness Of Breath   Hydralazine Anxiety   Hallucinations    Lisinopril Swelling        Medication List        Accurate as of July 01, 2023 10:14 AM. If you have any questions, ask your nurse or doctor.          Accu-Chek Aviva Plus test strip Generic drug: glucose blood Use as instructed TO CHECK BLOOD GLUCOSE THREE TIMES DAILY   acetaminophen 500 MG tablet Commonly known as: TYLENOL Take 1,000 mg by mouth every 6 (six) hours as needed for moderate pain or headache.   albuterol 108 (90 Base) MCG/ACT inhaler Commonly known as: VENTOLIN HFA  Inhale 2 puffs into the lungs every 4 (four) hours as needed for wheezing or shortness of breath.   allopurinol 100 MG tablet Commonly known as: ZYLOPRIM Take 2 tablets (200 mg total) by mouth daily.   AMBULATORY NON FORMULARY MEDICATION Inject 0.2 ml by intracavernosal route as directed.  Medication Name: TriMix PGE 10 mcg Pap   30 mg Phent 1 mg   amLODipine 5 MG tablet Commonly known as: NORVASC Take 5 mg by mouth daily.   aspirin EC 81 MG tablet Take 1 tablet (81 mg total) by mouth daily.   atorvastatin 80 MG tablet Commonly known as:  LIPITOR Take 1 tablet (80 mg total) by mouth daily.   B-D ULTRAFINE III SHORT PEN 31G X 8 MM Misc Generic drug: Insulin Pen Needle 1 each by Does not apply route as directed.   carvedilol 25 MG tablet Commonly known as: COREG Take 1 tablet (25 mg total) by mouth 2 (two) times daily with a meal.   cloNIDine 0.3 MG tablet Commonly known as: CATAPRES Take 1 tablet (0.3 mg total) by mouth 3 (three) times daily.   esomeprazole 20 MG capsule Commonly known as: NEXIUM Take 20 mg by mouth daily.   ezetimibe 10 MG tablet Commonly known as: ZETIA Take 10 mg by mouth every evening.   fluticasone 50 MCG/ACT nasal spray Commonly known as: FLONASE Place 1 spray into both nostrils daily as needed for allergies or rhinitis.   furosemide 80 MG tablet Commonly known as: LASIX Take 80 mg by mouth daily.   gabapentin 100 MG capsule Commonly known as: NEURONTIN Take 1 capsule (100 mg total) by mouth 3 (three) times daily.   HumaLOG KwikPen 100 UNIT/ML KwikPen Generic drug: insulin lispro Inject 8-14 Units into the skin 3 (three) times daily. Sliding scale 30 units TID   Ipratropium-Albuterol 20-100 MCG/ACT Aers respimat Commonly known as: COMBIVENT Inhale 1 puff into the lungs every 6 (six) hours as needed for wheezing or shortness of breath.   Levemir FlexTouch 100 UNIT/ML FlexTouch Pen Generic drug: insulin detemir Inject 50 Units into the skin at bedtime.   Linzess 145 MCG Caps capsule Generic drug: linaclotide Take 145 mcg by mouth daily before breakfast.   multivitamin Tabs tablet Take 1 tablet by mouth at bedtime.   sevelamer carbonate 800 MG tablet Commonly known as: RENVELA Take 3 tablets (2,400 mg total) by mouth 3 (three) times daily with meals. What changed: additional instructions        Allergies:  Allergies  Allergen Reactions   Ozempic (0.25 Or 0.5 Mg-Dose) [Semaglutide(0.25 Or 0.5mg -Dos)] Shortness Of Breath   Hydralazine Anxiety    Hallucinations     Lisinopril Swelling    Family History: Family History  Problem Relation Age of Onset   Gout Mother    Asthma Mother    Diabetes Father    Heart failure Father    Diabetes Sister    Hypertension Brother    Pancreatic cancer Brother    Diabetes Sister     Social History:  reports that he has never smoked. He has been exposed to tobacco smoke. He has never used smokeless tobacco. He reports that he does not currently use alcohol. He reports that he does not use drugs.  ROS: All other review of systems were reviewed and are negative except what is noted above in HPI  Physical Exam: BP (!) 161/91   Pulse 87   Constitutional:  Alert and oriented, No acute distress. HEENT: [place] AT, moist  mucus membranes.  Trachea midline, no masses. Cardiovascular: No clubbing, cyanosis, or edema. Respiratory: Normal respiratory effort, no increased work of breathing. GI: Abdomen is soft, nontender, nondistended, no abdominal masses GU: No CVA tenderness.  Lymph: No cervical or inguinal lymphadenopathy. Skin: No rashes, bruises or suspicious lesions. Neurologic: Grossly intact, no focal deficits, moving all 4 extremities. Psychiatric: Normal mood and affect.  Laboratory Data: Lab Results  Component Value Date   WBC 6.9 06/08/2023   HGB 9.8 (L) 06/08/2023   HCT 31.2 (L) 06/08/2023   MCV 88.4 06/08/2023   PLT 192 06/08/2023    Lab Results  Component Value Date   CREATININE 11.40 (H) 06/08/2023    No results found for: "PSA"  No results found for: "TESTOSTERONE"  Lab Results  Component Value Date   HGBA1C 7.4 (H) 10/18/2022    Urinalysis    Component Value Date/Time   COLORURINE STRAW (A) 04/20/2021 1740   APPEARANCEUR CLEAR 04/20/2021 1740   LABSPEC 1.011 04/20/2021 1740   PHURINE 8.0 04/20/2021 1740   GLUCOSEU 150 (A) 04/20/2021 1740   HGBUR NEGATIVE 04/20/2021 1740   BILIRUBINUR NEGATIVE 04/20/2021 1740   KETONESUR NEGATIVE 04/20/2021 1740   PROTEINUR 100 (A) 04/20/2021  1740   UROBILINOGEN 0.2 06/11/2012 0518   NITRITE NEGATIVE 04/20/2021 1740   LEUKOCYTESUR NEGATIVE 04/20/2021 1740    Lab Results  Component Value Date   BACTERIA NONE SEEN 04/20/2021    Pertinent Imaging:  No results found for this or any previous visit.  Results for orders placed during the hospital encounter of 06/22/19  US Venous Img Lower Bilateral  Narrative CLINICAL DATA:  Bilateral lower extremity pain and edema. Evaluate for DVT.  EXAM: BILATERAL LOWER EXTREMITY VENOUS DOPPLER ULTRASOUND  TECHNIQUE: Gray-scale sonography with graded compression, as well as color Doppler and duplex ultrasound were performed to evaluate the lower extremity deep venous systems from the level of the common femoral vein and including the common femoral, femoral, profunda femoral, popliteal and calf veins including the posterior tibial, peroneal and gastrocnemius veins when visible. The superficial great saphenous vein was also interrogated. Spectral Doppler was utilized to evaluate flow at rest and with distal augmentation maneuvers in the common femoral, femoral and popliteal veins.  COMPARISON:  None.  FINDINGS: RIGHT LOWER EXTREMITY  Common Femoral Vein: No evidence of thrombus. Normal compressibility, respiratory phasicity and response to augmentation.  Saphenofemoral Junction: No evidence of thrombus. Normal compressibility and flow on color Doppler imaging.  Profunda Femoral Vein: No evidence of thrombus. Normal compressibility and flow on color Doppler imaging.  Femoral Vein: No evidence of thrombus. Normal compressibility, respiratory phasicity and response to augmentation.  Popliteal Vein: No evidence of thrombus. Normal compressibility, respiratory phasicity and response to augmentation.  Calf Veins: No evidence of thrombus. Normal compressibility and flow on color Doppler imaging.  Superficial Great Saphenous Vein: No evidence of thrombus.  Normal compressibility.  Venous Reflux:  None.  Other Findings:  None.  LEFT LOWER EXTREMITY  Common Femoral Vein: No evidence of thrombus. Normal compressibility, respiratory phasicity and response to augmentation.  Saphenofemoral Junction: No evidence of thrombus. Normal compressibility and flow on color Doppler imaging.  Profunda Femoral Vein: No evidence of thrombus. Normal compressibility and flow on color Doppler imaging.  Femoral Vein: No evidence of thrombus. Normal compressibility, respiratory phasicity and response to augmentation.  Popliteal Vein: No evidence of thrombus. Normal compressibility, respiratory phasicity and response to augmentation.  Calf Veins: No evidence of thrombus. Normal compressibility and flow on color Doppler  imaging.  Superficial Great Saphenous Vein: No evidence of thrombus. Normal compressibility.  Venous Reflux:  None.  Other Findings:  None.  IMPRESSION: No evidence of DVT within either lower extremity.   Electronically Signed By: Simonne Come M.D. On: 06/22/2019 14:52  No results found for this or any previous visit.  No results found for this or any previous visit.  Results for orders placed during the hospital encounter of 06/22/19  US RENAL  Narrative CLINICAL DATA:  Acute renal failure  EXAM: RENAL / URINARY TRACT ULTRASOUND COMPLETE  COMPARISON:  None.  FINDINGS: Right Kidney:  Renal measurements: 11.7 x 4.8 x 6.1 cm = volume: 177 mL . Echogenicity within normal limits. No mass or hydronephrosis visualized.  Left Kidney:  Renal measurements: 11.1 x 5.3 x 5.9 cm = volume: 181 mL. Echogenicity within normal limits. No mass or hydronephrosis visualized.  Bladder:  Appears normal for degree of bladder distention.  IMPRESSION: Normal bilateral renal ultrasound.   Electronically Signed By: Elige Ko On: 06/23/2019 11:46  No valid procedures specified. No results found for this or any previous  visit.  Results for orders placed during the hospital encounter of 12/15/20  CT Renal Stone Study  Narrative CLINICAL DATA:  RIGHT flank pain and hematuria suspected kidney stone, symptoms began after dialysis today. History hypertension, diabetes mellitus, chronic kidney disease, COPD  EXAM: CT ABDOMEN AND PELVIS WITHOUT CONTRAST  TECHNIQUE: Multidetector CT imaging of the abdomen and pelvis was performed following the standard protocol without IV contrast.  COMPARISON:  None  FINDINGS: Lower chest: Minimal dependent atelectasis LEFT lung base. Minimal pericardial effusion.  Hepatobiliary: Gallbladder and liver normal appearance  Pancreas: Normal appearance  Spleen: Normal appearance  Adrenals/Urinary Tract: Adrenal glands normal appearance. Atrophic kidneys without mass or hydronephrosis. Bladder wall thickening, though bladder is decompressed, potentially artifact. No ureteral calcification or dilatation.  Stomach/Bowel: Normal appendix. Mild sigmoid diverticulosis without evidence of diverticulitis. Slightly increased stool in proximal half of colon. Stomach and bowel loops otherwise normal appearance.  Vascular/Lymphatic: Atherosclerotic calcifications aorta, iliac arteries, and coronary arteries. Aorta normal caliber without aneurysm. No adenopathy. Upper normal sized inguinal nodes bilaterally.  Reproductive: Unremarkable prostate gland and seminal vesicles  Other: Periumbilical hernia containing fat. Infiltrate changes in subcutaneous soft tissues of the anterior abdominal wall in the mid abdomen with associated skin thickening question medication injection sites versus cellulitis. No free air or free fluid.  Musculoskeletal: No acute osseous findings.  IMPRESSION: Atrophic kidneys without mass or hydronephrosis.  Mild sigmoid diverticulosis without evidence of diverticulitis.  Minimal pericardial effusion.  Periumbilical hernia containing  fat.  Infiltrate changes in subcutaneous soft tissues of the anterior abdominal wall in the mid abdomen with associated skin thickening question medication injection sites versus cellulitis.  Aortic Atherosclerosis (ICD10-I70.0).   Electronically Signed By: Ulyses Southward M.D. On: 12/15/2020 15:40   Assessment & Plan:    1. Organic impotence We will trial trimix with of alprostadil    No follow-ups on file.  Wilkie Aye, MD  [facility] [place]

## 2023-07-14 ENCOUNTER — Ambulatory Visit (HOSPITAL_COMMUNITY): Payer: 59

## 2023-07-14 ENCOUNTER — Ambulatory Visit: Payer: 59 | Admitting: Vascular Surgery

## 2023-07-17 NOTE — Progress Notes (Signed)
[facility] - Clinic Note  07/22/2023   CHIEF COMPLAINT Patient presents for Retina Follow Up  HISTORY OF PRESENT ILLNESS: Kelly Key is a 52 y.o. male who presents to the clinic today for:  HPI     Retina Follow Up   Patient presents with  Diabetic Retinopathy.  In both eyes.  This started 4 weeks ago.  Duration of 4 weeks.  Since onset it is stable.  I, the attending physician,  performed the HPI with the patient and updated documentation appropriately.        Comments   4 week retina follow up NPDR OU and IVA OU pt is reporting no vision changes noticed he denies any flashes or floaters pt last blood sugar reading was 113       Last edited by Rennis Chris, MD on 07/22/2023 10:32 AM.    Pt states his blood sugar was 113 this morning, he states the first injection was "rough", but he got through it  Referring physician: Pearson Grippe, MD [address] [place],  [place] [address]  HISTORICAL INFORMATION:  Selected notes from the MEDICAL RECORD NUMBER Referred by Dr. Einar Pheasant ([facility]) for diabetic retinopathy LEE:  Ocular Hx- PMH-   CURRENT MEDICATIONS: No current outpatient medications on file. (Ophthalmic Drugs)   No current facility-administered medications for this visit. (Ophthalmic Drugs)   Current Outpatient Medications (Other)  Medication Sig   acetaminophen (TYLENOL) 500 MG tablet Take 1,000 mg by mouth every 6 (six) hours as needed for moderate pain or headache.   albuterol (VENTOLIN HFA) 108 (90 Base) MCG/ACT inhaler Inhale 2 puffs into the lungs every 4 (four) hours as needed for wheezing or shortness of breath.   allopurinol (ZYLOPRIM) 100 MG tablet Take 2 tablets (200 mg total) by mouth daily.   AMBULATORY NON FORMULARY MEDICATION Inject 0.2 ml by intracavernosal route as directed.  Medication Name: TriMix PGE 30 mcg Pap   30 mg Phent 1 mg   amLODipine (NORVASC) 5 MG tablet Take 5 mg by mouth daily.    aspirin EC 81 MG tablet Take 1 tablet (81 mg total) by mouth daily.   atorvastatin (LIPITOR) 80 MG tablet Take 1 tablet (80 mg total) by mouth daily.   carvedilol (COREG) 25 MG tablet Take 1 tablet (25 mg total) by mouth 2 (two) times daily with a meal.   cloNIDine (CATAPRES) 0.3 MG tablet Take 1 tablet (0.3 mg total) by mouth 3 (three) times daily.   esomeprazole (NEXIUM) 20 MG capsule Take 20 mg by mouth daily.   ezetimibe (ZETIA) 10 MG tablet Take 10 mg by mouth every evening.   fluticasone (FLONASE) 50 MCG/ACT nasal spray Place 1 spray into both nostrils daily as needed for allergies or rhinitis.   furosemide (LASIX) 80 MG tablet Take 80 mg by mouth daily.   gabapentin (NEURONTIN) 100 MG capsule Take 1 capsule (100 mg total) by mouth 3 (three) times daily.   glucose blood (ACCU-CHEK AVIVA PLUS) test strip Use as instructed TO CHECK BLOOD GLUCOSE THREE TIMES DAILY   HUMALOG KWIKPEN 100 UNIT/ML KwikPen Inject 8-14 Units into the skin 3 (three) times daily. Sliding scale 30 units TID   insulin detemir (LEVEMIR FLEXTOUCH) 100 UNIT/ML FlexPen Inject 50 Units into the skin at bedtime.   Insulin Pen Needle (B-D ULTRAFINE III SHORT PEN) 31G X 8 MM MISC 1 each by Does not apply route as directed.   Ipratropium-Albuterol (COMBIVENT) 20-100 MCG/ACT AERS  respimat Inhale 1 puff into the lungs every 6 (six) hours as needed for wheezing or shortness of breath.   linaclotide (LINZESS) 145 MCG CAPS capsule Take 145 mcg by mouth daily before breakfast.   multivitamin (RENA-VIT) TABS tablet Take 1 tablet by mouth at bedtime.   sevelamer carbonate (RENVELA) 800 MG tablet Take 3 tablets (2,400 mg total) by mouth 3 (three) times daily with meals. (Patient taking differently: Take 2,400 mg by mouth 3 (three) times daily with meals. And once daily with snacks)   Current Facility-Administered Medications (Other)  Medication Route   0.9 %  sodium chloride infusion Intravenous   REVIEW OF  SYSTEMS:   ALLERGIES Allergies  Allergen Reactions   Ozempic (0.25 Or 0.5 Mg-Dose) [Semaglutide(0.25 Or 0.5mg -Dos)] Shortness Of Breath   Hydralazine Anxiety    Hallucinations    Lisinopril Swelling   PAST MEDICAL HISTORY Past Medical History:  Diagnosis Date   Acute diastolic CHF (congestive heart failure) (HCC) 06/11/2012   EF 50-55% [facility])   Chronic kidney disease    COPD (chronic obstructive pulmonary disease) (HCC)    Diabetes mellitus    A1c 11.5 (06/11/2012).   Dyspnea    Gout    Hepatic steatosis 06/11/2012   Elevated LFTs   Hyperlipemia    Malignant hypertension    Microcytic anemia 06/12/2012   Obesity    Past Surgical History:  Procedure Laterality Date   ABDOMINAL AORTOGRAM W/LOWER EXTREMITY N/A 12/01/2022   Procedure: ABDOMINAL AORTOGRAM W/LOWER EXTREMITY;  Surgeon: Cephus Shelling, MD;  Location: [facility] INVASIVE CV LAB;  Service: Cardiovascular;  Laterality: N/A;   AMPUTATION Left 12/03/2022   Procedure: LEFT SECOND TOE AMPUTATION;  Surgeon: Nadara Mustard, MD;  Location: [facility] OR;  Service: Orthopedics;  Laterality: Left;   AV FISTULA PLACEMENT Left 11/11/2019   Procedure: ARTERIOVENOUS (AV) FISTULA CREATION LEFT BRACHIOCEPHALIC ARM;  Surgeon: Nada Libman, MD;  Location: [facility] OR;  Service: Vascular;  Laterality: Left;   COLONOSCOPY WITH PROPOFOL N/A 09/02/2021   Procedure: COLONOSCOPY WITH PROPOFOL;  Surgeon: Wyline Mood, MD;  Location: [facility] ENDOSCOPY;  Service: Gastroenterology;  Laterality: N/A;   IR FLUORO GUIDE CV LINE RIGHT  07/08/2019   IR REMOVAL TUN CV CATH W/O FL  03/23/2020   IR US GUIDE VASC ACCESS RIGHT  07/08/2019   LEFT HEART CATH AND CORONARY ANGIOGRAPHY N/A 06/12/2021   Procedure: LEFT HEART CATH AND CORONARY ANGIOGRAPHY;  Surgeon: Lyn Records, MD;  Location: [facility] INVASIVE CV LAB;  Service: Cardiovascular;  Laterality: N/A;   NO PAST SURGERIES     PERIPHERAL VASCULAR BALLOON ANGIOPLASTY Left 12/01/2022   Procedure: PERIPHERAL VASCULAR  BALLOON ANGIOPLASTY;  Surgeon: Cephus Shelling, MD;  Location: [facility] INVASIVE CV LAB;  Service: Cardiovascular;  Laterality: Left;  Anterior tibial   PERIPHERAL VASCULAR INTERVENTION Left 12/01/2022   Procedure: PERIPHERAL VASCULAR INTERVENTION;  Surgeon: Cephus Shelling, MD;  Location: [facility] INVASIVE CV LAB;  Service: Cardiovascular;  Laterality: Left;  sfa   VASCULAR SURGERY     FAMILY HISTORY Family History  Problem Relation Age of Onset   Gout Mother    Asthma Mother    Diabetes Father    Heart failure Father    Diabetes Sister    Hypertension Brother    Pancreatic cancer Brother    Diabetes Sister    SOCIAL HISTORY Social History   Tobacco Use   Smoking status: Never    Passive exposure: Past   Smokeless tobacco: Never  Vaping Use  Vaping status: Never Used  Substance Use Topics   Alcohol use: Not Currently    Comment: haven't drank in over 5 years    Drug use: No       OPHTHALMIC EXAM:  Base Eye Exam     Visual Acuity (Snellen - Linear)       Right Left   Dist [place] 20/40 -1 20/20   Dist ph [place] NI          Tonometry (Tonopen, 9:39 AM)       Right Left   Pressure 18 20  Squeezing/holding         Pupils       Pupils Dark Light Shape React APD   Right PERRL 2 1 Round Brisk None   Left PERRL 2 1 Round Brisk None         Visual Fields       Left Right    Full Full         Extraocular Movement       Right Left    Full, Ortho Full, Ortho         Neuro/Psych     Oriented x3: Yes   Mood/Affect: Normal         Dilation     Both eyes: 2.5% Phenylephrine @ 9:39 AM           Slit Lamp and Fundus Exam     External Exam       Right Left   External Normal Normal         Slit Lamp Exam       Right Left   Lids/Lashes Dermatochalasis - upper lid Dermatochalasis - upper lid   Conjunctiva/Sclera Mild Melanosis Mild Melanosis   Cornea Arcus, mild Debris in tear film Arcus, mild Debris in tear film   Anterior Chamber Deep  and clear Deep and clear   Iris Round and moderately dilated, No NVI Round and moderately dilated, No NVI   Lens 2+ Nuclear sclerosis, 2+ Cortical cataract 2+ Nuclear sclerosis, 2+ Cortical cataract   Anterior Vitreous Vitreous syneresis Vitreous syneresis         Fundus Exam       Right Left   Disc Pink and sharp, mild PPA Pink and sharp, mild PPA   C/D Ratio 0.5 0.5   Macula Flat, Blunted foveal reflex, central cystic changes -- improved, scattered MA/DBH Flat, Blunted foveal reflex, central cystic changes -- slightly improved, scattered MA/DBH   Vessels attenuated, Tortuous, AV crossing changes, Copper wiring attenuated, Tortuous, AV crossing changes, Copper wiring   Periphery Attached, 360 MA/DBH, No edema Attached, 360 MA/DBH, No edema           Refraction     Wearing Rx       Sphere Cylinder Axis   Right -0.25 +1.50 020   Left [place] +1.25 154           IMAGING AND PROCEDURES  Imaging and Procedures for 07/22/2023  OCT, Retina - OU - Both Eyes       Right Eye Quality was good. Central Foveal Thickness: 286. Progression has improved. Findings include no SRF, abnormal foveal contour, intraretinal hyper-reflective material, intraretinal fluid, vitreomacular adhesion (Interval improvement in central IRF/SRF).   Left Eye Central Foveal Thickness: 291. Progression has improved. Findings include no SRF, abnormal foveal contour, intraretinal hyper-reflective material, vitreomacular adhesion (Mild interval improvement in IRF/cystic changes temporal macula).   Notes *Images captured and stored on drive  Diagnosis /  Impression:  +DME OU (OD > OS) OD: Interval improvement in central IRF/SRF OS: Mild interval improvement in IRF/cystic changes temporal macula  Clinical management:  See below  Abbreviations: NFP - Normal foveal profile. CME - cystoid macular edema. PED - pigment epithelial detachment. IRF - intraretinal fluid. SRF - subretinal fluid. EZ - ellipsoid  zone. ERM - epiretinal membrane. ORA - outer retinal atrophy. ORT - outer retinal tubulation. SRHM - subretinal hyper-reflective material. IRHM - intraretinal hyper-reflective material      Intravitreal Injection, Pharmacologic Agent - OD - Right Eye       Time Out 07/22/2023. 9:43 AM. Confirmed correct patient, procedure, site, and patient consented.   Anesthesia Topical anesthesia was used. Anesthetic medications included Lidocaine 2%, Proparacaine 0.5%.   Procedure Preparation included 5% betadine to ocular surface, eyelid speculum. A supplied needle was used.   Injection: 1.25 mg Bevacizumab 1.25mg /0.79ml   Route: Intravitreal, Site: Right Eye   NDC: [id number], Lot: [id number], Expiration date: 09/05/2023   Post-op Post injection exam found visual acuity of at least counting fingers. The patient tolerated the procedure well. There were no complications. The patient received written and verbal post procedure care education.           ASSESSMENT/PLAN:   ICD-10-CM   1. Severe nonproliferative diabetic retinopathy of both eyes with macular edema associated with type 2 diabetes mellitus (HCC)  E11.3413 OCT, Retina - OU - Both Eyes    Intravitreal Injection, Pharmacologic Agent - OD - Right Eye    Bevacizumab (AVASTIN) SOLN 1.25 mg    2. Encounter for long-term (current) use of insulin (HCC)  Z79.4     3. Essential hypertension  I10     4. Hypertensive retinopathy of both eyes  H35.033     5. Cortical age-related cataract of both eyes  H25.013      1,2. Severe Non-proliferative diabetic retinopathy, both eyes - last A1c 7.4 (12.23.23) - s/p IVA OD #1 (08.28.24)  - FA (08.28.24) OD: Mild patches of capillary non profusion peripherally, Late leaking MA, hyperfluorescence of disc, focal perifoveal hyperfluorescence leakage, no NV, OS: Mild patches of capillary non profusion peripherally, Late leaking MA, no NV - BCVA OD 20/40 from 20/50, OS 20/20 - stable - OCT shows OD:  Interval improvement in central IRF/SRF; OS: Mild interval improvement in IRF/cystic changes temporal macula - recommend IVA #2 OD today 09.25.24 for DME - pt wishes to proceed - RBA of procedure discussed, questions answered - informed consent obtained and signed 08.28.24 (OU) - see procedure note - f/u in 4 wks -- DFE/OCT, possible injection   3,4. Hypertensive retinopathy OU - discussed importance of tight BP control - monitor   5.  Mixed Cataract OU - The symptoms of cataract, surgical options, and treatments and risks were discussed with patient. - discussed diagnosis and progression - monitor   Ophthalmic Meds Ordered this visit:  Meds ordered this encounter  Medications   Bevacizumab (AVASTIN) SOLN 1.25 mg     Return in about 4 weeks (around 08/19/2023) for f/u NPDR OU, DFE, OCT.  There are no Patient Instructions on file for this visit.  Explained the diagnoses, plan, and follow up with the patient and they expressed understanding.  Patient expressed understanding of the importance of proper follow up care.   This document serves as a record of services personally performed by Karie Chimera, MD, PhD. It was created on their behalf by De Blanch, an ophthalmic technician. The creation of this record  is the provider's dictation and/or activities during the visit.    Electronically signed by: De Blanch, OA, 07/22/23  10:36 AM  This document serves as a record of services personally performed by Karie Chimera, MD, PhD. It was created on their behalf by Glee Arvin. Manson Passey, OA an ophthalmic technician. The creation of this record is the provider's dictation and/or activities during the visit.    Electronically signed by: Glee Arvin. Manson Passey, OA 07/22/23 10:36 AM   Karie Chimera, M.D., Ph.D. Diseases & Surgery of the Retina and Vitreous [facility] 07/22/2023  I have reviewed the above documentation for accuracy and completeness, and I  agree with the above. Karie Chimera, M.D., Ph.D. 07/22/23 10:37 AM   Abbreviations: M myopia (nearsighted); A astigmatism; H hyperopia (farsighted); P presbyopia; Mrx spectacle prescription;  CTL contact lenses; OD right eye; OS left eye; OU both eyes  XT exotropia; ET esotropia; PEK punctate epithelial keratitis; PEE punctate epithelial erosions; DES dry eye syndrome; MGD meibomian gland dysfunction; ATs artificial tears; PFAT's preservative free artificial tears; NSC nuclear sclerotic cataract; PSC posterior subcapsular cataract; ERM epi-retinal membrane; PVD posterior vitreous detachment; RD retinal detachment; DM diabetes mellitus; DR diabetic retinopathy; NPDR non-proliferative diabetic retinopathy; PDR proliferative diabetic retinopathy; CSME clinically significant macular edema; DME diabetic macular edema; dbh dot blot hemorrhages; CWS cotton wool spot; POAG primary open angle glaucoma; C/D cup-to-disc ratio; HVF humphrey visual field; GVF goldmann visual field; OCT optical coherence tomography; IOP intraocular pressure; BRVO Branch retinal vein occlusion; CRVO central retinal vein occlusion; CRAO central retinal artery occlusion; BRAO branch retinal artery occlusion; RT retinal tear; SB scleral buckle; PPV pars plana vitrectomy; VH Vitreous hemorrhage; PRP panretinal laser photocoagulation; IVK intravitreal kenalog; VMT vitreomacular traction; MH Macular hole;  NVD neovascularization of the disc; NVE neovascularization elsewhere; AREDS age related eye disease study; ARMD age related macular degeneration; POAG primary open angle glaucoma; EBMD epithelial/anterior basement membrane dystrophy; ACIOL anterior chamber intraocular lens; IOL intraocular lens; PCIOL posterior chamber intraocular lens; Phaco/IOL phacoemulsification with intraocular lens placement; PRK photorefractive keratectomy; LASIK laser assisted in situ keratomileusis; HTN hypertension; DM diabetes mellitus; COPD chronic obstructive  pulmonary disease

## 2023-07-22 ENCOUNTER — Encounter (INDEPENDENT_AMBULATORY_CARE_PROVIDER_SITE_OTHER): Payer: Self-pay | Admitting: Ophthalmology

## 2023-07-22 ENCOUNTER — Ambulatory Visit: Payer: 59 | Admitting: Urology

## 2023-07-22 ENCOUNTER — Ambulatory Visit (INDEPENDENT_AMBULATORY_CARE_PROVIDER_SITE_OTHER): Payer: 59 | Admitting: Ophthalmology

## 2023-07-22 DIAGNOSIS — E113413 Type 2 diabetes mellitus with severe nonproliferative diabetic retinopathy with macular edema, bilateral: Secondary | ICD-10-CM

## 2023-07-22 DIAGNOSIS — H25013 Cortical age-related cataract, bilateral: Secondary | ICD-10-CM

## 2023-07-22 DIAGNOSIS — Z794 Long term (current) use of insulin: Secondary | ICD-10-CM | POA: Diagnosis not present

## 2023-07-22 DIAGNOSIS — H35033 Hypertensive retinopathy, bilateral: Secondary | ICD-10-CM

## 2023-07-22 DIAGNOSIS — I10 Essential (primary) hypertension: Secondary | ICD-10-CM | POA: Diagnosis not present

## 2023-07-22 MED ORDER — BEVACIZUMAB CHEMO INJECTION 1.25MG/0.05ML SYRINGE FOR KALEIDOSCOPE
1.2500 mg | INTRAVITREAL | Status: AC | PRN
Start: 2023-07-22 — End: 2023-07-22
  Administered 2023-07-22: 1.25 mg via INTRAVITREAL

## 2023-08-13 NOTE — Progress Notes (Signed)
[facility] - Clinic Note  08/19/2023   CHIEF COMPLAINT Patient presents for Retina Follow Up  HISTORY OF PRESENT ILLNESS: Kelly Key is a 52 y.o. male who presents to the clinic today for:  HPI     Retina Follow Up   Patient presents with  Diabetic Retinopathy.  In both eyes.  This started 4 weeks ago.  I, the attending physician,  performed the HPI with the patient and updated documentation appropriately.        Comments   Patient here for 4 weeks retina follow up for NPDR OU. Patient states vision doing a lillte better. No eye pain.       Last edited by Rennis Chris, MD on 08/19/2023 12:07 PM.    Patient feels the vision is slightly improving.   Referring physician: Pearson Grippe, MD [address] [place],  [place] [address]  HISTORICAL INFORMATION:  Selected notes from the MEDICAL RECORD NUMBER Referred by Dr. Einar Pheasant [facility]) for diabetic retinopathy LEE:  Ocular Hx- PMH-   CURRENT MEDICATIONS: No current outpatient medications on file. (Ophthalmic Drugs)   No current facility-administered medications for this visit. (Ophthalmic Drugs)   Current Outpatient Medications (Other)  Medication Sig   acetaminophen (TYLENOL) 500 MG tablet Take 1,000 mg by mouth every 6 (six) hours as needed for moderate pain or headache.   albuterol (VENTOLIN HFA) 108 (90 Base) MCG/ACT inhaler Inhale 2 puffs into the lungs every 4 (four) hours as needed for wheezing or shortness of breath.   allopurinol (ZYLOPRIM) 100 MG tablet Take 2 tablets (200 mg total) by mouth daily.   AMBULATORY NON FORMULARY MEDICATION Inject 0.2 ml by intracavernosal route as directed.  Medication Name: TriMix PGE 30 mcg Pap   30 mg Phent 1 mg   amLODipine (NORVASC) 5 MG tablet Take 5 mg by mouth daily.   aspirin EC 81 MG tablet Take 1 tablet (81 mg total) by mouth daily.   atorvastatin (LIPITOR) 80 MG tablet Take 1 tablet (80 mg total) by mouth daily.    carvedilol (COREG) 25 MG tablet Take 1 tablet (25 mg total) by mouth 2 (two) times daily with a meal.   cloNIDine (CATAPRES) 0.3 MG tablet Take 1 tablet (0.3 mg total) by mouth 3 (three) times daily.   esomeprazole (NEXIUM) 20 MG capsule Take 20 mg by mouth daily.   ezetimibe (ZETIA) 10 MG tablet Take 10 mg by mouth every evening.   fluticasone (FLONASE) 50 MCG/ACT nasal spray Place 1 spray into both nostrils daily as needed for allergies or rhinitis.   furosemide (LASIX) 80 MG tablet Take 80 mg by mouth daily.   gabapentin (NEURONTIN) 100 MG capsule Take 1 capsule (100 mg total) by mouth 3 (three) times daily.   glucose blood (ACCU-CHEK AVIVA PLUS) test strip Use as instructed TO CHECK BLOOD GLUCOSE THREE TIMES DAILY   HUMALOG KWIKPEN 100 UNIT/ML KwikPen Inject 8-14 Units into the skin 3 (three) times daily. Sliding scale 30 units TID   insulin detemir (LEVEMIR FLEXTOUCH) 100 UNIT/ML FlexPen Inject 50 Units into the skin at bedtime.   Insulin Pen Needle (B-D ULTRAFINE III SHORT PEN) 31G X 8 MM MISC 1 each by Does not apply route as directed.   linaclotide (LINZESS) 145 MCG CAPS capsule Take 145 mcg by mouth daily before breakfast.   multivitamin (RENA-VIT) TABS tablet Take 1 tablet by mouth at bedtime.   sevelamer carbonate (RENVELA) 800 MG tablet Take 3 tablets (  2,400 mg total) by mouth 3 (three) times daily with meals. (Patient taking differently: Take 2,400 mg by mouth 3 (three) times daily with meals. And once daily with snacks)   Ipratropium-Albuterol (COMBIVENT) 20-100 MCG/ACT AERS respimat Inhale 1 puff into the lungs every 6 (six) hours as needed for wheezing or shortness of breath.   Current Facility-Administered Medications (Other)  Medication Route   0.9 %  sodium chloride infusion Intravenous   REVIEW OF SYSTEMS: ROS   Positive for: Gastrointestinal, Endocrine, Eyes, Respiratory Last edited by Laddie Aquas, COA on 08/19/2023  9:30 AM.     ALLERGIES Allergies  Allergen  Reactions   Ozempic (0.25 Or 0.5 Mg-Dose) [Semaglutide(0.25 Or 0.5mg -Dos)] Shortness Of Breath   Hydralazine Anxiety    Hallucinations    Lisinopril Swelling   PAST MEDICAL HISTORY Past Medical History:  Diagnosis Date   Acute diastolic CHF (congestive heart failure) (HCC) 06/11/2012   EF 50-55% [facility])   Chronic kidney disease    COPD (chronic obstructive pulmonary disease) (HCC)    Diabetes mellitus    A1c 11.5 (06/11/2012).   Dyspnea    Gout    Hepatic steatosis 06/11/2012   Elevated LFTs   Hyperlipemia    Malignant hypertension    Microcytic anemia 06/12/2012   Obesity    Past Surgical History:  Procedure Laterality Date   ABDOMINAL AORTOGRAM W/LOWER EXTREMITY N/A 12/01/2022   Procedure: ABDOMINAL AORTOGRAM W/LOWER EXTREMITY;  Surgeon: Cephus Shelling, MD;  Location: [facility] INVASIVE CV LAB;  Service: Cardiovascular;  Laterality: N/A;   AMPUTATION Left 12/03/2022   Procedure: LEFT SECOND TOE AMPUTATION;  Surgeon: Nadara Mustard, MD;  Location[facility] OR;  Service: Orthopedics;  Laterality: Left;   AV FISTULA PLACEMENT Left 11/11/2019   Procedure: ARTERIOVENOUS (AV) FISTULA CREATION LEFT BRACHIOCEPHALIC ARM;  Surgeon: Nada Libman, MD;  Location: [facility] OR;  Service: Vascular;  Laterality: Left;   COLONOSCOPY WITH PROPOFOL N/A 09/02/2021   Procedure: COLONOSCOPY WITH PROPOFOL;  Surgeon: Wyline Mood, MD;  Location: [facility] ENDOSCOPY;  Service: Gastroenterology;  Laterality: N/A;   IR FLUORO GUIDE CV LINE RIGHT  07/08/2019   IR REMOVAL TUN CV CATH W/O FL  03/23/2020   IR US GUIDE VASC ACCESS RIGHT  07/08/2019   LEFT HEART CATH AND CORONARY ANGIOGRAPHY N/A 06/12/2021   Procedure: LEFT HEART CATH AND CORONARY ANGIOGRAPHY;  Surgeon: Lyn Records, MD;  Location: [facility] INVASIVE CV LAB;  Service: Cardiovascular;  Laterality: N/A;   NO PAST SURGERIES     PERIPHERAL VASCULAR BALLOON ANGIOPLASTY Left 12/01/2022   Procedure: PERIPHERAL VASCULAR BALLOON ANGIOPLASTY;  Surgeon: Cephus Shelling,  MD;  Location: [facility] INVASIVE CV LAB;  Service: Cardiovascular;  Laterality: Left;  Anterior tibial   PERIPHERAL VASCULAR INTERVENTION Left 12/01/2022   Procedure: PERIPHERAL VASCULAR INTERVENTION;  Surgeon: Cephus Shelling, MD;  Location: [facility] INVASIVE CV LAB;  Service: Cardiovascular;  Laterality: Left;  sfa   VASCULAR SURGERY     FAMILY HISTORY Family History  Problem Relation Age of Onset   Gout Mother    Asthma Mother    Diabetes Father    Heart failure Father    Diabetes Sister    Hypertension Brother    Pancreatic cancer Brother    Diabetes Sister    SOCIAL HISTORY Social History   Tobacco Use   Smoking status: Never    Passive exposure: Past   Smokeless tobacco: Never  Vaping Use   Vaping status: Never Used  Substance Use Topics  Alcohol use: Not Currently    Comment: haven't drank in over 5 years    Drug use: No       OPHTHALMIC EXAM:  Base Eye Exam     Visual Acuity (Snellen - Linear)       Right Left   Dist [place] 20/30 +2 20/20   Dist ph  NI          Tonometry (Tonopen, 9:28 AM)       Right Left   Pressure 19 18         Pupils       Dark Light Shape React APD   Right 2 1 Round Brisk None   Left 2 1 Round Brisk None         Visual Fields (Counting fingers)       Left Right    Full Full         Extraocular Movement       Right Left    Full, Ortho Full, Ortho         Neuro/Psych     Oriented x3: Yes   Mood/Affect: Normal         Dilation     Both eyes: 1.0% Mydriacyl, 2.5% Phenylephrine @ 9:28 AM           Slit Lamp and Fundus Exam     External Exam       Right Left   External Normal Normal         Slit Lamp Exam       Right Left   Lids/Lashes Dermatochalasis - upper lid Dermatochalasis - upper lid   Conjunctiva/Sclera Mild Melanosis Mild Melanosis   Cornea Arcus, mild Debris in tear film Arcus, mild Debris in tear film   Anterior Chamber Deep and clear Deep and clear   Iris Round and moderately  dilated, No NVI Round and moderately dilated, No NVI   Lens 2+ Nuclear sclerosis, 2+ Cortical cataract 2+ Nuclear sclerosis, 2+ Cortical cataract   Anterior Vitreous Vitreous syneresis Vitreous syneresis         Fundus Exam       Right Left   Disc Pink and sharp, mild PPA Pink and sharp, mild PPA   C/D Ratio 0.5 0.5   Macula Flat, good foveal reflex, central cystic changes -- improved, scattered MA/DBH-- improving Flat, Blunted foveal reflex, central cystic changes -- slightly improved, scattered MA/DBH   Vessels attenuated, Tortuous, AV crossing changes, Copper wiring attenuated, Tortuous, AV crossing changes, Copper wiring   Periphery Attached, 360 MA/DBH, No edema Attached, 360 MA/DBH, No edema           Refraction     Wearing Rx       Sphere Cylinder Axis   Right -0.25 +1.50 020   Left [place] +1.25 154           IMAGING AND PROCEDURES  Imaging and Procedures for 08/19/2023  OCT, Retina - OU - Both Eyes       Right Eye Quality was good. Central Foveal Thickness: 272. Progression has improved. Findings include no SRF, abnormal foveal contour, intraretinal hyper-reflective material, intraretinal fluid, vitreomacular adhesion (Interval improvement in central IRF inferior macula and fovea, stable improvement in SRF).   Left Eye Central Foveal Thickness: 288. Progression has improved. Findings include no SRF, abnormal foveal contour, intraretinal hyper-reflective material, vitreomacular adhesion (Mild interval improvement in IRF/cystic changes temporal macula).   Notes *Images captured and stored on drive  Diagnosis / Impression:  +  DME OU (OD > OS) OD: Interval improvement in central IRF inferior macula and fovea, stable improvement in SRF OS: Mild interval improvement in IRF/cystic changes temporal macula  Clinical management:  See below  Abbreviations: NFP - Normal foveal profile. CME - cystoid macular edema. PED - pigment epithelial detachment. IRF -  intraretinal fluid. SRF - subretinal fluid. EZ - ellipsoid zone. ERM - epiretinal membrane. ORA - outer retinal atrophy. ORT - outer retinal tubulation. SRHM - subretinal hyper-reflective material. IRHM - intraretinal hyper-reflective material      Intravitreal Injection, Pharmacologic Agent - OD - Right Eye       Time Out 08/19/2023. 9:56 AM. Confirmed correct patient, procedure, site, and patient consented.   Anesthesia Topical anesthesia was used. Anesthetic medications included Lidocaine 2%, Proparacaine 0.5%.   Procedure Preparation included 5% betadine to ocular surface, eyelid speculum. A supplied needle was used.   Injection: 1.25 mg Bevacizumab 1.25mg /0.20ml   Route: Intravitreal, Site: Right Eye   NDC: [id number], Lot: [id number], Expiration date: 10/03/2023   Post-op Post injection exam found visual acuity of at least counting fingers. The patient tolerated the procedure well. There were no complications. The patient received written and verbal post procedure care education.           ASSESSMENT/PLAN:   ICD-10-CM   1. Severe nonproliferative diabetic retinopathy of both eyes with macular edema associated with type 2 diabetes mellitus (HCC)  E11.3413 OCT, Retina - OU - Both Eyes    Intravitreal Injection, Pharmacologic Agent - OD - Right Eye    Bevacizumab (AVASTIN) SOLN 1.25 mg    2. Encounter for long-term (current) use of insulin (HCC)  Z79.4     3. Essential hypertension  I10     4. Hypertensive retinopathy of both eyes  H35.033     5. Cortical age-related cataract of both eyes  H25.013      1,2. Severe Non-proliferative diabetic retinopathy, both eyes - last A1c 7.4 (12.23.23) - s/p IVA OD #1 (08.28.24), #2 (09.25.24) - FA (08.28.24) OD: Mild patches of capillary non perfusion peripherally, Late leaking MA, hyperfluorescence of disc, focal perifoveal hyperfluorescence leakage, no NV, OS: Mild patches of capillary non perfusion peripherally, Late leaking MA,  no NV - BCVA OD 20/30 from 20/40, OS 20/20 - stable - OCT shows OD: Interval improvement in central IRF inferior macula and fovea, stable improvement in SRF; OS: Mild interval improvement in IRF/cystic changes temporal macula - recommend IVA #3 OD today, 10.23.24 for DME - pt wishes to proceed - RBA of procedure discussed, questions answered - informed consent obtained and signed 08.28.24 (OU) - see procedure note - f/u in 4 wks -- DFE/OCT, possible injection   3,4. Hypertensive retinopathy OU - discussed importance of tight BP control - monitor   5.  Mixed Cataract OU - The symptoms of cataract, surgical options, and treatments and risks were discussed with patient. - discussed diagnosis and progression - monitor   Ophthalmic Meds Ordered this visit:  Meds ordered this encounter  Medications   Bevacizumab (AVASTIN) SOLN 1.25 mg     Return in about 4 weeks (around 09/16/2023) for f/u NPDR OU , DFE, OCT, Possible, IVA, OD.  There are no Patient Instructions on file for this visit.  Explained the diagnoses, plan, and follow up with the patient and they expressed understanding.  Patient expressed understanding of the importance of proper follow up care.   This document serves as a record of services personally performed by Karie Chimera,  MD, PhD. It was created on their behalf by De Blanch, an ophthalmic technician. The creation of this record is the provider's dictation and/or activities during the visit.    Electronically signed by: De Blanch, OA, 08/19/23  12:13 PM  This document serves as a record of services personally performed by Karie Chimera, MD, PhD. It was created on their behalf by Charlette Caffey, COT an ophthalmic technician. The creation of this record is the provider's dictation and/or activities during the visit.    Electronically signed by:  Charlette Caffey, COT  08/19/23 12:13 PM  Karie Chimera, M.D., Ph.D. Diseases & Surgery of the  Retina and Vitreous [facility] 08/19/2023  I have reviewed the above documentation for accuracy and completeness, and I agree with the above. Karie Chimera, M.D., Ph.D. 08/19/23 12:13 PM  Abbreviations: M myopia (nearsighted); A astigmatism; H hyperopia (farsighted); P presbyopia; Mrx spectacle prescription;  CTL contact lenses; OD right eye; OS left eye; OU both eyes  XT exotropia; ET esotropia; PEK punctate epithelial keratitis; PEE punctate epithelial erosions; DES dry eye syndrome; MGD meibomian gland dysfunction; ATs artificial tears; PFAT's preservative free artificial tears; NSC nuclear sclerotic cataract; PSC posterior subcapsular cataract; ERM epi-retinal membrane; PVD posterior vitreous detachment; RD retinal detachment; DM diabetes mellitus; DR diabetic retinopathy; NPDR non-proliferative diabetic retinopathy; PDR proliferative diabetic retinopathy; CSME clinically significant macular edema; DME diabetic macular edema; dbh dot blot hemorrhages; CWS cotton wool spot; POAG primary open angle glaucoma; C/D cup-to-disc ratio; HVF humphrey visual field; GVF goldmann visual field; OCT optical coherence tomography; IOP intraocular pressure; BRVO Branch retinal vein occlusion; CRVO central retinal vein occlusion; CRAO central retinal artery occlusion; BRAO branch retinal artery occlusion; RT retinal tear; SB scleral buckle; PPV pars plana vitrectomy; VH Vitreous hemorrhage; PRP panretinal laser photocoagulation; IVK intravitreal kenalog; VMT vitreomacular traction; MH Macular hole;  NVD neovascularization of the disc; NVE neovascularization elsewhere; AREDS age related eye disease study; ARMD age related macular degeneration; POAG primary open angle glaucoma; EBMD epithelial/anterior basement membrane dystrophy; ACIOL anterior chamber intraocular lens; IOL intraocular lens; PCIOL posterior chamber intraocular lens; Phaco/IOL phacoemulsification with intraocular lens placement; PRK  photorefractive keratectomy; LASIK laser assisted in situ keratomileusis; HTN hypertension; DM diabetes mellitus; COPD chronic obstructive pulmonary disease

## 2023-08-19 ENCOUNTER — Ambulatory Visit (INDEPENDENT_AMBULATORY_CARE_PROVIDER_SITE_OTHER): Payer: 59 | Admitting: Ophthalmology

## 2023-08-19 ENCOUNTER — Encounter (INDEPENDENT_AMBULATORY_CARE_PROVIDER_SITE_OTHER): Payer: Self-pay | Admitting: Ophthalmology

## 2023-08-19 DIAGNOSIS — I10 Essential (primary) hypertension: Secondary | ICD-10-CM | POA: Diagnosis not present

## 2023-08-19 DIAGNOSIS — H35033 Hypertensive retinopathy, bilateral: Secondary | ICD-10-CM

## 2023-08-19 DIAGNOSIS — Z794 Long term (current) use of insulin: Secondary | ICD-10-CM

## 2023-08-19 DIAGNOSIS — E113413 Type 2 diabetes mellitus with severe nonproliferative diabetic retinopathy with macular edema, bilateral: Secondary | ICD-10-CM | POA: Diagnosis not present

## 2023-08-19 DIAGNOSIS — H25013 Cortical age-related cataract, bilateral: Secondary | ICD-10-CM

## 2023-08-19 MED ORDER — BEVACIZUMAB CHEMO INJECTION 1.25MG/0.05ML SYRINGE FOR KALEIDOSCOPE
1.2500 mg | INTRAVITREAL | Status: AC | PRN
Start: 2023-08-19 — End: 2023-08-19
  Administered 2023-08-19: 1.25 mg via INTRAVITREAL

## 2023-09-02 ENCOUNTER — Ambulatory Visit (INDEPENDENT_AMBULATORY_CARE_PROVIDER_SITE_OTHER)
Admission: RE | Admit: 2023-09-02 | Discharge: 2023-09-02 | Disposition: A | Discharge status: Home / Self Care | Payer: 59 | Source: Ambulatory Visit | Attending: Vascular Surgery

## 2023-09-02 ENCOUNTER — Encounter: Payer: Self-pay | Admitting: Vascular Surgery

## 2023-09-02 ENCOUNTER — Ambulatory Visit (HOSPITAL_COMMUNITY)
Admission: RE | Admit: 2023-09-02 | Discharge: 2023-09-02 | Disposition: A | Discharge status: Home / Self Care | Payer: 59 | Source: Ambulatory Visit | Attending: Vascular Surgery | Admitting: Vascular Surgery

## 2023-09-02 ENCOUNTER — Ambulatory Visit: Payer: 59 | Admitting: Vascular Surgery

## 2023-09-02 VITALS — BP 171/104 | HR 77 | Temp 97.9°F | Resp 20 | Ht 67.0 in | Wt 327.7 lb

## 2023-09-02 DIAGNOSIS — R600 Localized edema: Secondary | ICD-10-CM | POA: Insufficient documentation

## 2023-09-02 DIAGNOSIS — I739 Peripheral vascular disease, unspecified: Secondary | ICD-10-CM | POA: Insufficient documentation

## 2023-09-02 DIAGNOSIS — N186 End stage renal disease: Secondary | ICD-10-CM | POA: Diagnosis not present

## 2023-09-02 LAB — VAS US ABI WITH/WO TBI
Left ABI: 1.22
Right ABI: 1.07

## 2023-09-02 NOTE — Progress Notes (Signed)
Patient ID: Kelly Key, male   DOB: [birth year], 52 y.o.   MRN: [id number]  Reason for Consult: Follow-up   Referred by Pearson Grippe, MD  Subjective:     HPI:  Kelly Key is a 52 y.o. male history of end-stage renal disease currently dialyzing Tuesdays, Thursdays and Saturdays via left arm AV fistula.  He has a history of left SFA stenting to heal second toe agitation.  Patient now has well-healed and is here for follow-up today.  He does not have any complaints about his dialysis access or his left foot and he walks without limitation at this point.  He does take an aspirin and a statin.  He is on the transplant list at [facility].  Past Medical History:  Diagnosis Date   Acute diastolic CHF (congestive heart failure) (HCC) 06/11/2012   EF 50-55% [facility])   Chronic kidney disease    COPD (chronic obstructive pulmonary disease) (HCC)    Diabetes mellitus    A1c 11.5 (06/11/2012).   Dyspnea    Gout    Hepatic steatosis 06/11/2012   Elevated LFTs   Hyperlipemia    Malignant hypertension    Microcytic anemia 06/12/2012   Obesity    Family History  Problem Relation Age of Onset   Gout Mother    Asthma Mother    Diabetes Father    Heart failure Father    Diabetes Sister    Hypertension Brother    Pancreatic cancer Brother    Diabetes Sister    Past Surgical History:  Procedure Laterality Date   ABDOMINAL AORTOGRAM W/LOWER EXTREMITY N/A 12/01/2022   Procedure: ABDOMINAL AORTOGRAM W/LOWER EXTREMITY;  Surgeon: Cephus Shelling, MD;  Location: [facility] INVASIVE CV LAB;  Service: Cardiovascular;  Laterality: N/A;   AMPUTATION Left 12/03/2022   Procedure: LEFT SECOND TOE AMPUTATION;  Surgeon: Nadara Mustard, MD;  Location: [facility] OR;  Service: Orthopedics;  Laterality: Left;   AV FISTULA PLACEMENT Left 11/11/2019   Procedure: ARTERIOVENOUS (AV) FISTULA CREATION LEFT BRACHIOCEPHALIC ARM;  Surgeon: Nada Libman, MD;  Location: [facility] OR;  Service: Vascular;  Laterality: Left;    COLONOSCOPY WITH PROPOFOL N/A 09/02/2021   Procedure: COLONOSCOPY WITH PROPOFOL;  Surgeon: Wyline Mood, MD;  Location: [facility] ENDOSCOPY;  Service: Gastroenterology;  Laterality: N/A;   IR FLUORO GUIDE CV LINE RIGHT  07/08/2019   IR REMOVAL TUN CV CATH W/O FL  03/23/2020   IR US GUIDE VASC ACCESS RIGHT  07/08/2019   LEFT HEART CATH AND CORONARY ANGIOGRAPHY N/A 06/12/2021   Procedure: LEFT HEART CATH AND CORONARY ANGIOGRAPHY;  Surgeon: Lyn Records, MD;  Location: [facility] INVASIVE CV LAB;  Service: Cardiovascular;  Laterality: N/A;   NO PAST SURGERIES     PERIPHERAL VASCULAR BALLOON ANGIOPLASTY Left 12/01/2022   Procedure: PERIPHERAL VASCULAR BALLOON ANGIOPLASTY;  Surgeon: Cephus Shelling, MD;  Location: [facility] INVASIVE CV LAB;  Service: Cardiovascular;  Laterality: Left;  Anterior tibial   PERIPHERAL VASCULAR INTERVENTION Left 12/01/2022   Procedure: PERIPHERAL VASCULAR INTERVENTION;  Surgeon: Cephus Shelling, MD;  Location: [facility] INVASIVE CV LAB;  Service: Cardiovascular;  Laterality: Left;  sfa   VASCULAR SURGERY      Short Social History:  Social History   Tobacco Use   Smoking status: Never    Passive exposure: Past   Smokeless tobacco: Never  Substance Use Topics   Alcohol use: Not Currently    Comment: haven't drank in over 5 years     Allergies  Allergen Reactions   Ozempic (0.25 Or 0.5 Mg-Dose) [Semaglutide(0.25 Or 0.5mg -Dos)] Shortness Of Breath   Hydralazine Anxiety    Hallucinations    Lisinopril Swelling    Current Outpatient Medications  Medication Sig Dispense Refill   acetaminophen (TYLENOL) 500 MG tablet Take 1,000 mg by mouth every 6 (six) hours as needed for moderate pain or headache.     albuterol (VENTOLIN HFA) 108 (90 Base) MCG/ACT inhaler Inhale 2 puffs into the lungs every 4 (four) hours as needed for wheezing or shortness of breath. 18 g 3   allopurinol (ZYLOPRIM) 100 MG tablet Take 2 tablets (200 mg total) by mouth daily. 30 tablet 0   AMBULATORY NON FORMULARY  MEDICATION Inject 0.2 ml by intracavernosal route as directed.  Medication Name: TriMix PGE 30 mcg Pap   30 mg Phent 1 mg 5 mL 0   amLODipine (NORVASC) 5 MG tablet Take 5 mg by mouth daily.     aspirin EC 81 MG tablet Take 1 tablet (81 mg total) by mouth daily. 30 tablet 4   atorvastatin (LIPITOR) 80 MG tablet Take 1 tablet (80 mg total) by mouth daily. 90 tablet 3   carvedilol (COREG) 25 MG tablet Take 1 tablet (25 mg total) by mouth 2 (two) times daily with a meal. 60 tablet 5   cloNIDine (CATAPRES) 0.3 MG tablet Take 1 tablet (0.3 mg total) by mouth 3 (three) times daily. 90 tablet 3   esomeprazole (NEXIUM) 20 MG capsule Take 20 mg by mouth daily.     ezetimibe (ZETIA) 10 MG tablet Take 10 mg by mouth every evening.     fluticasone (FLONASE) 50 MCG/ACT nasal spray Place 1 spray into both nostrils daily as needed for allergies or rhinitis.     furosemide (LASIX) 80 MG tablet Take 80 mg by mouth daily.     gabapentin (NEURONTIN) 100 MG capsule Take 1 capsule (100 mg total) by mouth 3 (three) times daily. 90 capsule 0   glucose blood (ACCU-CHEK AVIVA PLUS) test strip Use as instructed TO CHECK BLOOD GLUCOSE THREE TIMES DAILY 200 each 3   HUMALOG KWIKPEN 100 UNIT/ML KwikPen Inject 8-14 Units into the skin 3 (three) times daily. Sliding scale 30 units TID     insulin detemir (LEVEMIR FLEXTOUCH) 100 UNIT/ML FlexPen Inject 50 Units into the skin at bedtime. 15 mL 3   Insulin Pen Needle (B-D ULTRAFINE III SHORT PEN) 31G X 8 MM MISC 1 each by Does not apply route as directed. 100 each 3   linaclotide (LINZESS) 145 MCG CAPS capsule Take 145 mcg by mouth daily before breakfast.     multivitamin (RENA-VIT) TABS tablet Take 1 tablet by mouth at bedtime. 30 tablet 0   sevelamer carbonate (RENVELA) 800 MG tablet Take 3 tablets (2,400 mg total) by mouth 3 (three) times daily with meals. (Patient taking differently: Take 2,400 mg by mouth 3 (three) times daily with meals. And once daily with snacks) 270  tablet 0   Ipratropium-Albuterol (COMBIVENT) 20-100 MCG/ACT AERS respimat Inhale 1 puff into the lungs every 6 (six) hours as needed for wheezing or shortness of breath. 1 each 2   Current Facility-Administered Medications  Medication Dose Route Frequency Provider Last Rate Last Admin   0.9 %  sodium chloride infusion   Intravenous PRN Carroll Sage, PA-C        Review of Systems  Constitutional:  Constitutional negative. HENT: HENT negative.  Eyes: Eyes negative.  Respiratory: Respiratory negative.  Cardiovascular: Cardiovascular negative.  GI: Gastrointestinal negative.  Musculoskeletal: Musculoskeletal negative.  Skin: Skin negative.  Hematologic: Hematologic/lymphatic negative.  Psychiatric: Psychiatric negative.        Objective:  Objective   Vitals:   09/02/23 0852  BP: (!) 171/104  Pulse: 77  Resp: 20  Temp: 97.9 F (36.6 C)  SpO2: 95%  Weight: (!) 327 lb 11.2 oz (148.6 kg)  Height: 5\' 7"  (1.702 m)   Body mass index is 51.33 kg/m.  Physical Exam HENT:     Head: Normocephalic.  Cardiovascular:     Rate and Rhythm: Normal rate.     Pulses:          Dorsalis pedis pulses are detected w/ Doppler on the left side.       Posterior tibial pulses are detected w/ Doppler on the left side.  Musculoskeletal:     Right lower leg: No edema.     Left lower leg: No edema.     Comments: Well healed left 2nd toe ulceration  Skin:    Capillary Refill: Capillary refill takes less than 2 seconds.  Neurological:     General: No focal deficit present.     Mental Status: He is alert.  Psychiatric:        Mood and Affect: Mood normal.        Thought Content: Thought content normal.        Judgment: Judgment normal.     Data: ABI Findings:  +---------+------------------+-----+---------+--------+  Right   Rt Pressure (mmHg)IndexWaveform Comment   +---------+------------------+-----+---------+--------+  Brachial 170                                        +---------+------------------+-----+---------+--------+  ATA     163               0.96 triphasic          +---------+------------------+-----+---------+--------+  PTA     182               1.07 triphasic          +---------+------------------+-----+---------+--------+  Great Toe156               0.92                    +---------+------------------+-----+---------+--------+   +---------+------------------+-----+---------+-------+  Left    Lt Pressure (mmHg)IndexWaveform Comment  +---------+------------------+-----+---------+-------+  ATA     163               0.96 biphasic          +---------+------------------+-----+---------+-------+  PTA     208               1.22 triphasic         +---------+------------------+-----+---------+-------+  Great Toe148               0.87                   +---------+------------------+-----+---------+-------+   +-------+-----------+-----------+------------+------------+  ABI/TBIToday's ABIToday's TBIPrevious ABIPrevious TBI  +-------+-----------+-----------+------------+------------+  Right 1.07       0.92       1.05        0.70          +-------+-----------+-----------+------------+------------+  Left  1.22       0.87       0.98        0.66          +-------+-----------+-----------+------------+------------+  Bilateral ABIs and TBIs appear increased compared to prior study on  01/06/23.    Summary:  Right: Resting right ankle-brachial index is within normal range. The  right toe-brachial index is normal.   Left: Resting left ankle-brachial index is within normal range. The left  toe-brachial index is normal.    LEFT       PSV cm/sRatioStenosisWaveform Comments            +-----------+--------+-----+--------+---------+------------------+  CFA Distal 108                  triphasic                     +-----------+--------+-----+--------+---------+------------------+  DFA       71                   triphasic                    +-----------+--------+-----+--------+---------+------------------+  SFA Prox   102                  triphasic                    +-----------+--------+-----+--------+---------+------------------+  POP Prox   53                   biphasic                     +-----------+--------+-----+--------+---------+------------------+  POP Distal 52                   biphasic                     +-----------+--------+-----+--------+---------+------------------+  TP Trunk   54                   triphasic                    +-----------+--------+-----+--------+---------+------------------+  ATA Distal 67                   biphasic                     +-----------+--------+-----+--------+---------+------------------+  PTA Prox   93                   biphasic                     +-----------+--------+-----+--------+---------+------------------+  PTA Mid    97                   biphasic                     +-----------+--------+-----+--------+---------+------------------+  PTA Distal 89                   biphasic                     +-----------+--------+-----+--------+---------+------------------+  PERO Prox                                Unable to insonate  +-----------+--------+-----+--------+---------+------------------+  PERO Mid  Unable to insonate  +-----------+--------+-----+--------+---------+------------------+  PERO Distal                              Unable to insonate  +-----------+--------+-----+--------+---------+------------------+     Left Stent(s):  +---------------+--------+--------+---------+--------+  SFA           PSV cm/sStenosisWaveform Comments  +---------------+--------+--------+---------+--------+  Prox to Stent  103              triphasic          +---------------+--------+--------+---------+--------+  Proximal Stent 99              triphasic          +---------------+--------+--------+---------+--------+  Mid Stent      120             triphasic          +---------------+--------+--------+---------+--------+  Distal Stent   109             triphasic          +---------------+--------+--------+---------+--------+  Distal to Stent102             triphasic          +---------------+--------+--------+---------+--------+    Summary:  Left: Patent stent with no evidence of stenosis in the superficial femoral  artery.      Assessment/Plan:    52 year old male with history of left SFA stenting to heal a left second toe amputation which is now well-healed with very strong signals distally and a patent stent by duplex.  Plan will be to continue aspirin and statin follow-up in 6 months with repeat studies.  If studies remain satisfactory at that time we can carry him out to 1 year.    Maeola Harman MD [facility]

## 2023-09-14 ENCOUNTER — Encounter (HOSPITAL_COMMUNITY): Payer: Self-pay | Admitting: Emergency Medicine

## 2023-09-14 ENCOUNTER — Emergency Department (HOSPITAL_COMMUNITY): Payer: 59

## 2023-09-14 ENCOUNTER — Other Ambulatory Visit: Payer: Self-pay

## 2023-09-14 ENCOUNTER — Emergency Department (HOSPITAL_COMMUNITY)
Admission: EM | Admit: 2023-09-14 | Discharge: 2023-09-14 | Disposition: A | Discharge status: Home / Self Care | Payer: 59 | Attending: Emergency Medicine | Admitting: Emergency Medicine

## 2023-09-14 DIAGNOSIS — Z1152 Encounter for screening for COVID-19: Secondary | ICD-10-CM | POA: Insufficient documentation

## 2023-09-14 DIAGNOSIS — R600 Localized edema: Secondary | ICD-10-CM | POA: Insufficient documentation

## 2023-09-14 DIAGNOSIS — Z7982 Long term (current) use of aspirin: Secondary | ICD-10-CM | POA: Insufficient documentation

## 2023-09-14 DIAGNOSIS — Z992 Dependence on renal dialysis: Secondary | ICD-10-CM | POA: Insufficient documentation

## 2023-09-14 DIAGNOSIS — N186 End stage renal disease: Secondary | ICD-10-CM | POA: Insufficient documentation

## 2023-09-14 DIAGNOSIS — Z794 Long term (current) use of insulin: Secondary | ICD-10-CM | POA: Insufficient documentation

## 2023-09-14 DIAGNOSIS — R0602 Shortness of breath: Secondary | ICD-10-CM | POA: Insufficient documentation

## 2023-09-14 LAB — BRAIN NATRIURETIC PEPTIDE: B Natriuretic Peptide: 434 pg/mL — ABNORMAL HIGH (ref 0.0–100.0)

## 2023-09-14 LAB — CBC WITH DIFFERENTIAL/PLATELET
Abs Immature Granulocytes: 0.02 10*3/uL (ref 0.00–0.07)
Basophils Absolute: 0 10*3/uL (ref 0.0–0.1)
Basophils Relative: 1 %
Eosinophils Absolute: 0.8 10*3/uL — ABNORMAL HIGH (ref 0.0–0.5)
Eosinophils Relative: 11 %
HCT: 29.5 % — ABNORMAL LOW (ref 39.0–52.0)
Hemoglobin: 9.4 g/dL — ABNORMAL LOW (ref 13.0–17.0)
Immature Granulocytes: 0 %
Lymphocytes Relative: 26 %
Lymphs Abs: 2 10*3/uL (ref 0.7–4.0)
MCH: 28.7 pg (ref 26.0–34.0)
MCHC: 31.9 g/dL (ref 30.0–36.0)
MCV: 90.2 fL (ref 80.0–100.0)
Monocytes Absolute: 0.6 10*3/uL (ref 0.1–1.0)
Monocytes Relative: 7 %
Neutro Abs: 4.2 10*3/uL (ref 1.7–7.7)
Neutrophils Relative %: 55 %
Platelets: 233 10*3/uL (ref 150–400)
RBC: 3.27 MIL/uL — ABNORMAL LOW (ref 4.22–5.81)
RDW: 15.4 % (ref 11.5–15.5)
WBC: 7.6 10*3/uL (ref 4.0–10.5)
nRBC: 0 % (ref 0.0–0.2)

## 2023-09-14 LAB — TROPONIN I (HIGH SENSITIVITY)
Troponin I (High Sensitivity): 27 ng/L — ABNORMAL HIGH (ref <18)
Troponin I (High Sensitivity): 27 ng/L — ABNORMAL HIGH (ref <18)

## 2023-09-14 LAB — BASIC METABOLIC PANEL
Anion gap: 13 (ref 5–15)
BUN: 62 mg/dL — ABNORMAL HIGH (ref 6–20)
CO2: 27 mmol/L (ref 22–32)
Calcium: 8.6 mg/dL — ABNORMAL LOW (ref 8.9–10.3)
Chloride: 94 mmol/L — ABNORMAL LOW (ref 98–111)
Creatinine, Ser: 10.4 mg/dL — ABNORMAL HIGH (ref 0.61–1.24)
GFR, Estimated: 5 mL/min — ABNORMAL LOW (ref >60)
Glucose, Bld: 302 mg/dL — ABNORMAL HIGH (ref 70–99)
Potassium: 4.8 mmol/L (ref 3.5–5.1)
Sodium: 134 mmol/L — ABNORMAL LOW (ref 135–145)

## 2023-09-14 LAB — RESP PANEL BY RT-PCR (RSV, FLU A&B, COVID)  RVPGX2
Influenza A by PCR: NEGATIVE
Influenza B by PCR: NEGATIVE
Resp Syncytial Virus by PCR: NEGATIVE
SARS Coronavirus 2 by RT PCR: NEGATIVE

## 2023-09-14 MED ORDER — PREDNISONE 10 MG PO TABS: 40.0000 mg | ORAL_TABLET | Freq: Every day | ORAL | 0 refills | Status: AC

## 2023-09-14 MED ORDER — ALBUTEROL SULFATE (2.5 MG/3ML) 0.083% IN NEBU
5.0000 mg | INHALATION_SOLUTION | Freq: Once | RESPIRATORY_TRACT | Status: AC
  Administered 2023-09-14: 5 mg via RESPIRATORY_TRACT
  Filled 2023-09-14: qty 6

## 2023-09-14 MED ORDER — PREDNISONE 20 MG PO TABS
40.0000 mg | ORAL_TABLET | Freq: Once | ORAL | Status: AC
  Administered 2023-09-14: 40 mg via ORAL
  Filled 2023-09-14: qty 2

## 2023-09-14 MED ORDER — ALBUTEROL SULFATE HFA 108 (90 BASE) MCG/ACT IN AERS
2.0000 | INHALATION_SPRAY | Freq: Once | RESPIRATORY_TRACT | Status: AC
  Administered 2023-09-14: 2 via RESPIRATORY_TRACT
  Filled 2023-09-14: qty 6.7

## 2023-09-14 NOTE — ED Triage Notes (Signed)
Pt with c/o SOB since 1am. Pt does HD T,TH,Sat.

## 2023-09-14 NOTE — Discharge Instructions (Signed)
A prescription for a steroid was sent to your pharmacy.  Take as prescribed for the next 3 days.  Use your inhaler as needed.  Make sure you go to your dialysis session tomorrow morning for further fluid removal.  Return to the emergency department for any new or worsening symptoms of concern.

## 2023-09-14 NOTE — ED Provider Notes (Signed)
[facility]ERGENCY DEPARTMENT AT [facility] Provider Note   CSN: [id number] Arrival date & time: 09/14/23  4166     History  Chief Complaint  Patient presents with   Shortness of Breath    Kelly Key is a 52 y.o. male.  Presents to the emergency for evaluation of shortness of breath.  Patient reports that symptoms began around 1 AM.  He is a dialysis patient.  He dialyzes on Tuesday, Thursday, Saturday.  He did his full session on Saturday without difficulty.       Home Medications Prior to Admission medications   Medication Sig Start Date End Date Taking? Authorizing Provider  acetaminophen (TYLENOL) 500 MG tablet Take 1,000 mg by mouth every 6 (six) hours as needed for moderate pain or headache.    [provider]  albuterol (VENTOLIN HFA) 108 (90 Base) MCG/ACT inhaler Inhale 2 puffs into the lungs every 4 (four) hours as needed for wheezing or shortness of breath. 05/06/23   Shon Hale, MD  allopurinol (ZYLOPRIM) 100 MG tablet Take 2 tablets (200 mg total) by mouth daily. 08/04/19   Love, Evlyn Kanner, PA-C  AMBULATORY NON FORMULARY MEDICATION Inject 0.2 ml by intracavernosal route as directed.  Medication Name: TriMix PGE 30 mcg Pap   30 mg Phent 1 mg 07/01/23   Malen Gauze, MD  amLODipine (NORVASC) 5 MG tablet Take 5 mg by mouth daily. 01/30/22   [provider]  aspirin EC 81 MG tablet Take 1 tablet (81 mg total) by mouth daily. 05/06/23   Shon Hale, MD  atorvastatin (LIPITOR) 80 MG tablet Take 1 tablet (80 mg total) by mouth daily. 05/06/23   Shon Hale, MD  carvedilol (COREG) 25 MG tablet Take 1 tablet (25 mg total) by mouth 2 (two) times daily with a meal. 05/06/23   Emokpae, Courage, MD  cloNIDine (CATAPRES) 0.3 MG tablet Take 1 tablet (0.3 mg total) by mouth 3 (three) times daily. 05/06/23   Shon Hale, MD  esomeprazole (NEXIUM) 20 MG capsule Take 20 mg by mouth daily. 01/23/22   [provider]   ezetimibe (ZETIA) 10 MG tablet Take 10 mg by mouth every evening. 01/20/19   [provider]  fluticasone (FLONASE) 50 MCG/ACT nasal spray Place 1 spray into both nostrils daily as needed for allergies or rhinitis.    [provider]  furosemide (LASIX) 80 MG tablet Take 80 mg by mouth daily.    [provider]  gabapentin (NEURONTIN) 100 MG capsule Take 1 capsule (100 mg total) by mouth 3 (three) times daily. 08/03/19   Love, Evlyn Kanner, PA-C  glucose blood (ACCU-CHEK AVIVA PLUS) test strip Use as instructed TO CHECK BLOOD GLUCOSE THREE TIMES DAILY 08/15/20   Nida, Denman George, MD  HUMALOG KWIKPEN 100 UNIT/ML KwikPen Inject 8-14 Units into the skin 3 (three) times daily. Sliding scale 30 units TID 08/19/22   [provider]  insulin detemir (LEVEMIR FLEXTOUCH) 100 UNIT/ML FlexPen Inject 50 Units into the skin at bedtime. 05/06/23   Emokpae, Courage, MD  Insulin Pen Needle (B-D ULTRAFINE III SHORT PEN) 31G X 8 MM MISC 1 each by Does not apply route as directed. 08/13/20   Roma Kayser, MD  Ipratropium-Albuterol (COMBIVENT) 20-100 MCG/ACT AERS respimat Inhale 1 puff into the lungs every 6 (six) hours as needed for wheezing or shortness of breath. 05/06/23 06/05/23  Shon Hale, MD  linaclotide (LINZESS) 145 MCG CAPS capsule Take 145 mcg by mouth daily before  breakfast.    [provider]  multivitamin (RENA-VIT) TABS tablet Take 1 tablet by mouth at bedtime. 08/03/19   Love, Evlyn Kanner, PA-C  sevelamer carbonate (RENVELA) 800 MG tablet Take 3 tablets (2,400 mg total) by mouth 3 (three) times daily with meals. Patient taking differently: Take 2,400 mg by mouth 3 (three) times daily with meals. And once daily with snacks 08/03/19   Love, Pamela S, PA-C      Allergies    Ozempic (0.25 or 0.5 mg-dose) [semaglutide(0.25 or 0.5mg -dos)], Hydralazine, and Lisinopril    Review of Systems   Review of Systems  Physical Exam Updated Vital Signs BP (!)  174/83   Pulse 86   Temp (!) 97.5 F (36.4 C) (Oral)   Resp 20   Ht 5\' 7"  (1.702 m)   Wt 136.1 kg   SpO2 96%   BMI 46.99 kg/m  Physical Exam Vitals and nursing note reviewed.  Constitutional:      General: He is not in acute distress.    Appearance: He is well-developed.  HENT:     Head: Normocephalic and atraumatic.     Mouth/Throat:     Mouth: Mucous membranes are moist.  Eyes:     General: Vision grossly intact. Gaze aligned appropriately.     Extraocular Movements: Extraocular movements intact.     Conjunctiva/sclera: Conjunctivae normal.  Cardiovascular:     Rate and Rhythm: Normal rate and regular rhythm.     Pulses: Normal pulses.     Heart sounds: Normal heart sounds, S1 normal and S2 normal. No murmur heard.    No friction rub. No gallop.  Pulmonary:     Effort: Pulmonary effort is normal. Tachypnea present. No respiratory distress.     Breath sounds: Decreased breath sounds present.  Abdominal:     Palpations: Abdomen is soft.     Tenderness: There is no abdominal tenderness. There is no guarding or rebound.     Hernia: No hernia is present.  Musculoskeletal:        General: No swelling.     Cervical back: Full passive range of motion without pain, normal range of motion and neck supple. No pain with movement, spinous process tenderness or muscular tenderness. Normal range of motion.     Right lower leg: No edema.     Left lower leg: No edema.  Skin:    General: Skin is warm and dry.     Capillary Refill: Capillary refill takes less than 2 seconds.     Findings: No ecchymosis, erythema, lesion or wound.  Neurological:     Mental Status: He is alert and oriented to person, place, and time.     GCS: GCS eye subscore is 4. GCS verbal subscore is 5. GCS motor subscore is 6.     Cranial Nerves: Cranial nerves 2-12 are intact.     Sensory: Sensation is intact.     Motor: Motor function is intact. No weakness or abnormal muscle tone.     Coordination: Coordination  is intact.  Psychiatric:        Mood and Affect: Mood normal.        Speech: Speech normal.        Behavior: Behavior normal.     ED Results / Procedures / Treatments   Labs (all labs ordered are listed, but only abnormal results are displayed) Labs Reviewed  RESP PANEL BY RT-PCR (RSV, FLU A&B, COVID)  [id number]  CBC WITH DIFFERENTIAL/PLATELET  BASIC METABOLIC PANEL  TROPONIN I (HIGH SENSITIVITY)    EKG EKG Interpretation Date/Time:  Monday September 14 2023 06:31:51 EST Ventricular Rate:  82 PR Interval:  227 QRS Duration:  107 QT Interval:  404 QTC Calculation: 472 R Axis:   -68  Text Interpretation: Sinus rhythm Prolonged PR interval Left anterior fascicular block Low voltage, precordial leads Probable anteroseptal infarct, old No acute changes Confirmed by Gilda Crease [phone]) on 09/14/2023 6:57:16 AM  Radiology DG Chest Portable 1 View  Result Date: 09/14/2023 CLINICAL DATA:  Shortness of breath since 1 a.m. EXAM: PORTABLE CHEST 1 VIEW COMPARISON:  06/08/2023 FINDINGS: The heart size and mediastinal contours are within normal limits. Both lungs are clear. The visualized skeletal structures are unremarkable. IMPRESSION: No active disease. Electronically Signed   By: Signa Kell M.D.   On: 09/14/2023 06:51    Procedures Procedures    Medications Ordered in ED Medications  albuterol (PROVENTIL) (2.5 MG/3ML) 0.083% nebulizer solution 5 mg (has no administration in time range)    ED Course/ Medical Decision Making/ A&P                                 Medical Decision Making Amount and/or Complexity of Data Reviewed Labs: ordered.  Risk Prescription drug management.   Presents with shortness of breath that began overnight.  Has been dialyzing as normal.  Examination reveals slightly increased work of breathing with decreased air movement, presentation consistent with mild bronchospasm.  Chest x-ray clear, no evidence of volume overload.  Workup  initiated.  Will give nebulizer treatment, signout oncoming ER physician to reevaluate.        Final Clinical Impression(s) / ED Diagnoses Final diagnoses:  Shortness of breath    Rx / DC Orders ED Discharge Orders     None         Lakya Schrupp, Canary Brim, MD 09/14/23 [id number]

## 2023-09-14 NOTE — ED Provider Notes (Signed)
Care of patient assumed from Dr. Oletta Cohn.  Patient with ESRD, dialyzed on Saturday, presenting for acute shortness of breath starting this morning.  Getting treatment for reactive airway disease.  Will require reassessment. Physical Exam  BP (!) 166/92   Pulse 73   Temp (!) 97.5 F (36.4 C) (Oral)   Resp 11   Ht 5\' 7"  (1.702 m)   Wt 136.1 kg   SpO2 100%   BMI 46.99 kg/m   Physical Exam Vitals and nursing note reviewed.  Constitutional:      General: He is not in acute distress.    Appearance: He is well-developed. He is obese. He is not ill-appearing, toxic-appearing or diaphoretic.  HENT:     Head: Normocephalic and atraumatic.     Mouth/Throat:     Mouth: Mucous membranes are moist.  Eyes:     Conjunctiva/sclera: Conjunctivae normal.  Cardiovascular:     Rate and Rhythm: Normal rate and regular rhythm.     Heart sounds: No murmur heard. Pulmonary:     Effort: Pulmonary effort is normal. No tachypnea or respiratory distress.     Breath sounds: Normal breath sounds. No decreased breath sounds, wheezing, rhonchi or rales.  Chest:     Chest wall: No tenderness.  Abdominal:     Palpations: Abdomen is soft.     Tenderness: There is no abdominal tenderness.  Musculoskeletal:        General: No swelling.     Cervical back: Normal range of motion and neck supple.     Right lower leg: Edema present.     Left lower leg: Edema present.  Skin:    General: Skin is warm and dry.     Coloration: Skin is not cyanotic or pale.  Neurological:     General: No focal deficit present.     Mental Status: He is alert and oriented to person, place, and time.  Psychiatric:        Mood and Affect: Mood normal.        Behavior: Behavior normal.     Procedures  Procedures  ED Course / MDM    Medical Decision Making Amount and/or Complexity of Data Reviewed Labs: ordered. Radiology: ordered.  Risk Prescription drug management.   On assessment, patient sitting recumbent on ED  stretcher.  Current breathing is unlabored.  He is able to speak in complete sentences.  He endorses some mild improvement in his shortness of breath following nebulized albuterol.  Currently, lungs are clear to auscultation.  On bedside ultrasound, there are some faint B-lines in biapical lung fields.  Prior to arrival, he endorses severe orthopnea.  Per chart review, last echocardiogram was in March.  He had grade 2 diastolic dysfunction with preserved EF at the time.  EF on ultrasound looks grossly normal.  Patient is unaware of his dry weight.  Troponins are consistent with prior lab work.  BNP is not markedly elevated.  He is scheduled for dialysis at 6 AM tomorrow.  Will prescribe burst of prednisone for continued treatment of reactive airway disease component of his shortness of breath.  Patient was discharged in stable condition.       Gloris Manchester, MD 09/14/23 1053

## 2023-09-16 ENCOUNTER — Encounter (INDEPENDENT_AMBULATORY_CARE_PROVIDER_SITE_OTHER): Payer: 59 | Admitting: Ophthalmology

## 2023-09-16 DIAGNOSIS — Z794 Long term (current) use of insulin: Secondary | ICD-10-CM

## 2023-09-16 DIAGNOSIS — E113413 Type 2 diabetes mellitus with severe nonproliferative diabetic retinopathy with macular edema, bilateral: Secondary | ICD-10-CM

## 2023-09-16 DIAGNOSIS — H35033 Hypertensive retinopathy, bilateral: Secondary | ICD-10-CM

## 2023-09-16 DIAGNOSIS — H25013 Cortical age-related cataract, bilateral: Secondary | ICD-10-CM

## 2023-09-16 DIAGNOSIS — I10 Essential (primary) hypertension: Secondary | ICD-10-CM

## 2023-09-16 NOTE — Progress Notes (Signed)
[facility] - Clinic Note  09/21/2023   CHIEF COMPLAINT Patient presents for Retina Follow Up  HISTORY OF PRESENT ILLNESS: Kelly Key is a 52 y.o. male who presents to the clinic today for:  HPI     Retina Follow Up   Patient presents with  Diabetic Retinopathy.  In both eyes.  This started 4.5 weeks ago.  I, the attending physician,  performed the HPI with the patient and updated documentation appropriately.        Comments   Patient here for 4.5 weeks retina follow up for NPDR OU. Patient states vision a lot better. No eye pain. Not using drops.       Last edited by Rennis Chris, MD on 09/21/2023 11:19 AM.     Referring physician: Pearson Grippe, MD [address] [place],  [place] [address]  HISTORICAL INFORMATION:  Selected notes from the MEDICAL RECORD NUMBER Referred by Dr. Einar Pheasant ([facility]) for diabetic retinopathy LEE:  Ocular Hx- PMH-   CURRENT MEDICATIONS: No current outpatient medications on file. (Ophthalmic Drugs)   No current facility-administered medications for this visit. (Ophthalmic Drugs)   Current Outpatient Medications (Other)  Medication Sig   acetaminophen (TYLENOL) 500 MG tablet Take 1,000 mg by mouth every 6 (six) hours as needed for moderate pain or headache.   albuterol (VENTOLIN HFA) 108 (90 Base) MCG/ACT inhaler Inhale 2 puffs into the lungs every 4 (four) hours as needed for wheezing or shortness of breath.   allopurinol (ZYLOPRIM) 100 MG tablet Take 2 tablets (200 mg total) by mouth daily.   AMBULATORY NON FORMULARY MEDICATION Inject 0.2 ml by intracavernosal route as directed.  Medication Name: TriMix PGE 30 mcg Pap   30 mg Phent 1 mg   amLODipine (NORVASC) 5 MG tablet Take 5 mg by mouth daily.   aspirin EC 81 MG tablet Take 1 tablet (81 mg total) by mouth daily.   atorvastatin (LIPITOR) 80 MG tablet Take 1 tablet (80 mg total) by mouth daily.   carvedilol (COREG) 25 MG tablet Take 1  tablet (25 mg total) by mouth 2 (two) times daily with a meal.   cloNIDine (CATAPRES) 0.3 MG tablet Take 1 tablet (0.3 mg total) by mouth 3 (three) times daily.   clopidogrel (PLAVIX) 75 MG tablet Take 75 mg by mouth daily.   esomeprazole (NEXIUM) 20 MG capsule Take 20 mg by mouth daily.   ezetimibe (ZETIA) 10 MG tablet Take 10 mg by mouth every evening.   fluticasone (FLONASE) 50 MCG/ACT nasal spray Place 1 spray into both nostrils daily as needed for allergies or rhinitis.   furosemide (LASIX) 80 MG tablet Take 80 mg by mouth daily.   gabapentin (NEURONTIN) 100 MG capsule Take 1 capsule (100 mg total) by mouth 3 (three) times daily.   glucose blood (ACCU-CHEK AVIVA PLUS) test strip Use as instructed TO CHECK BLOOD GLUCOSE THREE TIMES DAILY   HUMALOG KWIKPEN 100 UNIT/ML KwikPen Inject 8-30 Units into the skin 3 (three) times daily. Sliding scale 30 units max TID   insulin detemir (LEVEMIR FLEXTOUCH) 100 UNIT/ML FlexPen Inject 50 Units into the skin at bedtime.   Insulin Pen Needle (B-D ULTRAFINE III SHORT PEN) 31G X 8 MM MISC 1 each by Does not apply route as directed.   linaclotide (LINZESS) 145 MCG CAPS capsule Take 145 mcg by mouth daily before breakfast.   multivitamin (RENA-VIT) TABS tablet Take 1 tablet by mouth at bedtime.   sevelamer  carbonate (RENVELA) 800 MG tablet Take 3 tablets (2,400 mg total) by mouth 3 (three) times daily with meals. (Patient taking differently: Take 2,400 mg by mouth 3 (three) times daily with meals. And once daily with snacks)   VELPHORO 500 MG chewable tablet Chew 500 mg by mouth 3 (three) times daily.   Ipratropium-Albuterol (COMBIVENT) 20-100 MCG/ACT AERS respimat Inhale 1 puff into the lungs every 6 (six) hours as needed for wheezing or shortness of breath.   Current Facility-Administered Medications (Other)  Medication Route   0.9 %  sodium chloride infusion Intravenous   REVIEW OF SYSTEMS: ROS   Positive for: Gastrointestinal, Endocrine, Eyes,  Respiratory Last edited by Laddie Aquas, COA on 09/21/2023  9:16 AM.      ALLERGIES Allergies  Allergen Reactions   Ozempic (0.25 Or 0.5 Mg-Dose) [Semaglutide(0.25 Or 0.5mg -Dos)] Shortness Of Breath   Hydralazine Anxiety    Hallucinations    Lisinopril Swelling   PAST MEDICAL HISTORY Past Medical History:  Diagnosis Date   Acute diastolic CHF (congestive heart failure) (HCC) 06/11/2012   EF 50-55% [facility])   Chronic kidney disease    COPD (chronic obstructive pulmonary disease) (HCC)    Diabetes mellitus    A1c 11.5 (06/11/2012).   Dyspnea    Gout    Hepatic steatosis 06/11/2012   Elevated LFTs   Hyperlipemia    Malignant hypertension    Microcytic anemia 06/12/2012   Obesity    Past Surgical History:  Procedure Laterality Date   ABDOMINAL AORTOGRAM W/LOWER EXTREMITY N/A 12/01/2022   Procedure: ABDOMINAL AORTOGRAM W/LOWER EXTREMITY;  Surgeon: Cephus Shelling, MD;  Location: [facility] INVASIVE CV LAB;  Service: Cardiovascular;  Laterality: N/A;   AMPUTATION Left 12/03/2022   Procedure: LEFT SECOND TOE AMPUTATION;  Surgeon: Nadara Mustard, MD;  Location: [facility] OR;  Service: Orthopedics;  Laterality: Left;   AV FISTULA PLACEMENT Left 11/11/2019   Procedure: ARTERIOVENOUS (AV) FISTULA CREATION LEFT BRACHIOCEPHALIC ARM;  Surgeon: Nada Libman, MD;  Location: [facility] OR;  Service: Vascular;  Laterality: Left;   COLONOSCOPY WITH PROPOFOL N/A 09/02/2021   Procedure: COLONOSCOPY WITH PROPOFOL;  Surgeon: Wyline Mood, MD;  Location: [facility] ENDOSCOPY;  Service: Gastroenterology;  Laterality: N/A;   IR FLUORO GUIDE CV LINE RIGHT  07/08/2019   IR REMOVAL TUN CV CATH W/O FL  03/23/2020   IR US GUIDE VASC ACCESS RIGHT  07/08/2019   LEFT HEART CATH AND CORONARY ANGIOGRAPHY N/A 06/12/2021   Procedure: LEFT HEART CATH AND CORONARY ANGIOGRAPHY;  Surgeon: Lyn Records, MD;  Location: [facility]VAS[facility] LAB;  Service: Cardiovascular;  Laterality: N/A;   NO PAST SURGERIES     PERIPHERAL VASCULAR  BALLOON ANGIOPLASTY Left 12/01/2022   Procedure: PERIPHERAL VASCULAR BALLOON ANGIOPLASTY;  Surgeon: Cephus Shelling, MD;  Location: [facility] INVASIVE CV LAB;  Service: Cardiovascular;  Laterality: Left;  Anterior tibial   PERIPHERAL VASCULAR INTERVENTION Left 12/01/2022   Procedure: PERIPHERAL VASCULAR INTERVENTION;  Surgeon: Cephus Shelling, MD;  Location: [facility] CV LAB;  Service: Cardiovascular;  Laterality: Left;  sfa   VASCULAR SURGERY     FAMILY HISTORY Family History  Problem Relation Age of Onset   Gout Mother    Asthma Mother    Diabetes Father    Heart failure Father    Diabetes Sister    Hypertension Brother    Pancreatic cancer Brother    Diabetes Sister    SOCIAL HISTORY Social History   Tobacco Use   Smoking status: Never  Passive exposure: Past   Smokeless tobacco: Never  Vaping Use   Vaping status: Never Used  Substance Use Topics   Alcohol use: Not Currently    Comment: haven't drank in over 5 years    Drug use: No       OPHTHALMIC EXAM:  Base Eye Exam     Visual Acuity (Snellen - Linear)       Right Left   Dist [place] 20/25 -2 20/20   Dist ph [place] NI          Tonometry (Tonopen, 9:15 AM)       Right Left   Pressure 18 15         Pupils       Dark Light Shape React APD   Right 2 1 Round Brisk None   Left 2 1 Round Brisk None         Visual Fields (Counting fingers)       Left Right    Full Full         Extraocular Movement       Right Left    Full, Ortho Full, Ortho         Neuro/Psych     Oriented x3: Yes   Mood/Affect: Normal         Dilation     Both eyes: 1.0% Mydriacyl, 2.5% Phenylephrine @ 9:14 AM           Slit Lamp and Fundus Exam     External Exam       Right Left   External Normal Normal         Slit Lamp Exam       Right Left   Lids/Lashes Dermatochalasis - upper lid Dermatochalasis - upper lid   Conjunctiva/Sclera Mild Melanosis Mild Melanosis   Cornea Arcus, 1+ inferior  Punctate epithelial erosions arcus, tear film debris   Anterior Chamber Deep and clear Deep and clear   Iris Round and moderately dilated, No NVI Round and moderately dilated, No NVI   Lens 2+ Nuclear sclerosis, 2+ Cortical cataract 2+ Nuclear sclerosis, 2+ Cortical cataract   Anterior Vitreous Vitreous syneresis Vitreous syneresis         Fundus Exam       Right Left   Disc Pink and sharp, mild PPA Pink and sharp, mild PPA   C/D Ratio 0.5 0.5   Macula Flat, good foveal reflex, central cystic changes -- improved, scattered MA/DBH -- improving, scattered punctate exudates Flat, Blunted foveal reflex, central cystic changes, scattered MA/DBH, +focal laser targets   Vessels attenuated, Tortuous, AV crossing changes, Copper wiring attenuated, Tortuous, AV crossing changes, Copper wiring   Periphery Attached, 360 MA/DBH, No edema Attached, scattered MA/DBH, No edema           Refraction     Wearing Rx       Sphere Cylinder Axis   Right -0.25 +1.50 020   Left [place] +1.25 154           IMAGING AND PROCEDURES  Imaging and Procedures for 09/21/2023  OCT, Retina - OU - Both Eyes       Right Eye Quality was good. Central Foveal Thickness: 266. Progression has improved. Findings include no SRF, abnormal foveal contour, intraretinal hyper-reflective material, intraretinal fluid, vitreomacular adhesion (Stable improvement in central IRF inferior macula and fovea, stable improvement in SRF, scattered punctate IRHM - improved).   Left Eye Central Foveal Thickness: 284. Progression has been stable. Findings include no  SRF, abnormal foveal contour, intraretinal hyper-reflective material, vitreomacular adhesion ( persistent IRF/cystic changes IT fovea and macula).   Notes *Images captured and stored on drive  Diagnosis / Impression:  +DME OU (OD > OS) OD: Stable improvement in central IRF inferior macula and fovea, stable improvement in SRF, scattered punctate IRHM -- improved OS:  persistent IRF/cystic changes IT fovea and macula  Clinical management:  See below  Abbreviations: NFP - Normal foveal profile. CME - cystoid macular edema. PED - pigment epithelial detachment. IRF - intraretinal fluid. SRF - subretinal fluid. EZ - ellipsoid zone. ERM - epiretinal membrane. ORA - outer retinal atrophy. ORT - outer retinal tubulation. SRHM - subretinal hyper-reflective material. IRHM - intraretinal hyper-reflective material           ASSESSMENT/PLAN:   ICD-10-CM   1. Severe nonproliferative diabetic retinopathy of both eyes with macular edema associated with type 2 diabetes mellitus (HCC)  E11.3413 OCT, Retina - OU - Both Eyes    CANCELED: Intravitreal Injection, Pharmacologic Agent - OD - Right Eye    2. Encounter for long-term (current) use of insulin (HCC)  Z79.4     3. Essential hypertension  I10     4. Hypertensive retinopathy of both eyes  H35.033     5. Cortical age-related cataract of both eyes  H25.013     6. Anxiety  F41.9      1,2. Severe Non-proliferative diabetic retinopathy, both eyes - last A1c 7.4 (12.23.23) - s/p IVA OD #1 (08.28.24), #2 (09.25.24), #3 (10.23.24) - FA (08.28.24) OD: Mild patches of capillary non perfusion peripherally, Late leaking MA, hyperfluorescence of disc, focal perifoveal hyperfluorescence leakage, no NV, OS: Mild patches of capillary non perfusion peripherally, Late leaking MA, no NV - BCVA OD 20/25 from 20/30, OS 20/20 - stable - OCT shows OD: Stable improvement in central IRF inferior macula and fovea, stable improvement in SRF, scattered punctate IRHM -- improved; OS: persistent IRF/cystic changes IT fovea and macula - exam shows MAs OS -- amenable to focal laser - recommend holding IVA OD today, 11.25.24 and focal laser OS in 2 wks - pt in agreement - f/u in 2 wks -- DFE/OCT, focal laser OS, possible injection OD  3,4. Hypertensive retinopathy OU - discussed importance of tight BP control - monitor   5.  Mixed  Cataract OU - The symptoms of cataract, surgical options, and treatments and risks were discussed with patient. - discussed diagnosis and progression - monitor   6. Anxiety  - affecting ophthalmic examinations and treatments  - may benefit from mild anxiolytic prior to treatment visits (intravitreal injections and lasers)  - recommend discussing with primary care provider  Ophthalmic Meds Ordered this visit:  No orders of the defined types were placed in this encounter.    Return in about 2 weeks (around 10/05/2023) for f/u NPDR OU, DFE, OCT, focal laser OS.  There are no Patient Instructions on file for this visit.  Explained the diagnoses, plan, and follow up with the patient and they expressed understanding.  Patient expressed understanding of the importance of proper follow up care.   This document serves as a record of services personally performed by Karie Chimera, MD, PhD. It was created on their behalf by Glee Arvin. Manson Passey, OA an ophthalmic technician. The creation of this record is the provider's dictation and/or activities during the visit.    Electronically signed by: Glee Arvin. Manson Passey, OA 09/21/23 11:27 AM  Karie Chimera, M.D., Ph.D. Diseases & Surgery  of the Retina and Vitreous [facility] 09/21/2023  I have reviewed the above documentation for accuracy and completeness, and I agree with the above. Karie Chimera, M.D., Ph.D. 09/21/23 11:30 AM  Abbreviations: M myopia (nearsighted); A astigmatism; H hyperopia (farsighted); P presbyopia; Mrx spectacle prescription;  CTL contact lenses; OD right eye; OS left eye; OU both eyes  XT exotropia; ET esotropia; PEK punctate epithelial keratitis; PEE punctate epithelial erosions; DES dry eye syndrome; MGD meibomian gland dysfunction; ATs artificial tears; PFAT's preservative free artificial tears; NSC nuclear sclerotic cataract; PSC posterior subcapsular cataract; ERM epi-retinal membrane; PVD posterior  vitreous detachment; RD retinal detachment; DM diabetes mellitus; DR diabetic retinopathy; NPDR non-proliferative diabetic retinopathy; PDR proliferative diabetic retinopathy; CSME clinically significant macular edema; DME diabetic macular edema; dbh dot blot hemorrhages; CWS cotton wool spot; POAG primary open angle glaucoma; C/D cup-to-disc ratio; HVF humphrey visual field; GVF goldmann visual field; OCT optical coherence tomography; IOP intraocular pressure; BRVO Branch retinal vein occlusion; CRVO central retinal vein occlusion; CRAO central retinal artery occlusion; BRAO branch retinal artery occlusion; RT retinal tear; SB scleral buckle; PPV pars plana vitrectomy; VH Vitreous hemorrhage; PRP panretinal laser photocoagulation; IVK intravitreal kenalog; VMT vitreomacular traction; MH Macular hole;  NVD neovascularization of the disc; NVE neovascularization elsewhere; AREDS age related eye disease study; ARMD age related macular degeneration; POAG primary open angle glaucoma; EBMD epithelial/anterior basement membrane dystrophy; ACIOL anterior chamber intraocular lens; IOL intraocular lens; PCIOL posterior chamber intraocular lens; Phaco/IOL phacoemulsification with intraocular lens placement; PRK photorefractive keratectomy; LASIK laser assisted in situ keratomileusis; HTN hypertension; DM diabetes mellitus; COPD chronic obstructive pulmonary disease

## 2023-09-17 ENCOUNTER — Other Ambulatory Visit: Payer: Self-pay

## 2023-09-17 DIAGNOSIS — I739 Peripheral vascular disease, unspecified: Secondary | ICD-10-CM

## 2023-09-21 ENCOUNTER — Ambulatory Visit (INDEPENDENT_AMBULATORY_CARE_PROVIDER_SITE_OTHER): Payer: 59 | Admitting: Ophthalmology

## 2023-09-21 ENCOUNTER — Encounter (INDEPENDENT_AMBULATORY_CARE_PROVIDER_SITE_OTHER): Payer: Self-pay | Admitting: Ophthalmology

## 2023-09-21 DIAGNOSIS — Z794 Long term (current) use of insulin: Secondary | ICD-10-CM | POA: Diagnosis not present

## 2023-09-21 DIAGNOSIS — I10 Essential (primary) hypertension: Secondary | ICD-10-CM | POA: Diagnosis not present

## 2023-09-21 DIAGNOSIS — H25013 Cortical age-related cataract, bilateral: Secondary | ICD-10-CM

## 2023-09-21 DIAGNOSIS — F419 Anxiety disorder, unspecified: Secondary | ICD-10-CM

## 2023-09-21 DIAGNOSIS — H35033 Hypertensive retinopathy, bilateral: Secondary | ICD-10-CM

## 2023-09-21 DIAGNOSIS — E113413 Type 2 diabetes mellitus with severe nonproliferative diabetic retinopathy with macular edema, bilateral: Secondary | ICD-10-CM | POA: Diagnosis not present

## 2023-09-22 ENCOUNTER — Encounter: Payer: Self-pay | Admitting: Emergency Medicine

## 2023-09-22 ENCOUNTER — Observation Stay: Payer: 59

## 2023-09-22 ENCOUNTER — Observation Stay
Admission: EM | Admit: 2023-09-22 | Discharge: 2023-09-23 | Disposition: A | Discharge status: Home / Self Care | Payer: 59 | Attending: Emergency Medicine | Admitting: Emergency Medicine

## 2023-09-22 ENCOUNTER — Emergency Department: Payer: 59

## 2023-09-22 ENCOUNTER — Other Ambulatory Visit: Payer: Self-pay

## 2023-09-22 DIAGNOSIS — Z7982 Long term (current) use of aspirin: Secondary | ICD-10-CM | POA: Diagnosis not present

## 2023-09-22 DIAGNOSIS — R0602 Shortness of breath: Secondary | ICD-10-CM | POA: Diagnosis present

## 2023-09-22 DIAGNOSIS — I132 Hypertensive heart and chronic kidney disease with heart failure and with stage 5 chronic kidney disease, or end stage renal disease: Secondary | ICD-10-CM | POA: Diagnosis not present

## 2023-09-22 DIAGNOSIS — I5033 Acute on chronic diastolic (congestive) heart failure: Principal | ICD-10-CM | POA: Diagnosis present

## 2023-09-22 DIAGNOSIS — E119 Type 2 diabetes mellitus without complications: Secondary | ICD-10-CM

## 2023-09-22 DIAGNOSIS — I440 Atrioventricular block, first degree: Secondary | ICD-10-CM | POA: Diagnosis not present

## 2023-09-22 DIAGNOSIS — E8770 Fluid overload, unspecified: Secondary | ICD-10-CM | POA: Diagnosis not present

## 2023-09-22 DIAGNOSIS — Z79891 Long term (current) use of opiate analgesic: Secondary | ICD-10-CM | POA: Insufficient documentation

## 2023-09-22 DIAGNOSIS — I160 Hypertensive urgency: Secondary | ICD-10-CM | POA: Diagnosis present

## 2023-09-22 DIAGNOSIS — E1122 Type 2 diabetes mellitus with diabetic chronic kidney disease: Secondary | ICD-10-CM | POA: Diagnosis not present

## 2023-09-22 DIAGNOSIS — Z6841 Body Mass Index (BMI) 40.0 and over, adult: Secondary | ICD-10-CM | POA: Insufficient documentation

## 2023-09-22 DIAGNOSIS — I739 Peripheral vascular disease, unspecified: Secondary | ICD-10-CM | POA: Diagnosis present

## 2023-09-22 DIAGNOSIS — G4733 Obstructive sleep apnea (adult) (pediatric): Secondary | ICD-10-CM | POA: Diagnosis present

## 2023-09-22 DIAGNOSIS — R7989 Other specified abnormal findings of blood chemistry: Secondary | ICD-10-CM | POA: Diagnosis not present

## 2023-09-22 DIAGNOSIS — Z7902 Long term (current) use of antithrombotics/antiplatelets: Secondary | ICD-10-CM | POA: Diagnosis not present

## 2023-09-22 DIAGNOSIS — Z89422 Acquired absence of other left toe(s): Secondary | ICD-10-CM | POA: Insufficient documentation

## 2023-09-22 DIAGNOSIS — N186 End stage renal disease: Secondary | ICD-10-CM | POA: Diagnosis not present

## 2023-09-22 DIAGNOSIS — Z992 Dependence on renal dialysis: Secondary | ICD-10-CM | POA: Insufficient documentation

## 2023-09-22 DIAGNOSIS — Z7722 Contact with and (suspected) exposure to environmental tobacco smoke (acute) (chronic): Secondary | ICD-10-CM | POA: Insufficient documentation

## 2023-09-22 DIAGNOSIS — E669 Obesity, unspecified: Secondary | ICD-10-CM | POA: Diagnosis present

## 2023-09-22 DIAGNOSIS — E1165 Type 2 diabetes mellitus with hyperglycemia: Secondary | ICD-10-CM | POA: Diagnosis not present

## 2023-09-22 DIAGNOSIS — Z794 Long term (current) use of insulin: Secondary | ICD-10-CM | POA: Insufficient documentation

## 2023-09-22 DIAGNOSIS — J449 Chronic obstructive pulmonary disease, unspecified: Secondary | ICD-10-CM | POA: Diagnosis not present

## 2023-09-22 LAB — CBC WITH DIFFERENTIAL/PLATELET
Abs Immature Granulocytes: 0.04 10*3/uL (ref 0.00–0.07)
Basophils Absolute: 0 10*3/uL (ref 0.0–0.1)
Basophils Relative: 0 %
Eosinophils Absolute: 1 10*3/uL — ABNORMAL HIGH (ref 0.0–0.5)
Eosinophils Relative: 11 %
HCT: 29.4 % — ABNORMAL LOW (ref 39.0–52.0)
Hemoglobin: 9.7 g/dL — ABNORMAL LOW (ref 13.0–17.0)
Immature Granulocytes: 0 %
Lymphocytes Relative: 21 %
Lymphs Abs: 1.9 10*3/uL (ref 0.7–4.0)
MCH: 28.9 pg (ref 26.0–34.0)
MCHC: 33 g/dL (ref 30.0–36.0)
MCV: 87.5 fL (ref 80.0–100.0)
Monocytes Absolute: 0.7 10*3/uL (ref 0.1–1.0)
Monocytes Relative: 8 %
Neutro Abs: 5.5 10*3/uL (ref 1.7–7.7)
Neutrophils Relative %: 60 %
Platelets: 234 10*3/uL (ref 150–400)
RBC: 3.36 MIL/uL — ABNORMAL LOW (ref 4.22–5.81)
RDW: 15.9 % — ABNORMAL HIGH (ref 11.5–15.5)
WBC: 9.1 10*3/uL (ref 4.0–10.5)
nRBC: 0 % (ref 0.0–0.2)

## 2023-09-22 LAB — CBC
HCT: 30.8 % — ABNORMAL LOW (ref 39.0–52.0)
Hemoglobin: 10.1 g/dL — ABNORMAL LOW (ref 13.0–17.0)
MCH: 28.8 pg (ref 26.0–34.0)
MCHC: 32.8 g/dL (ref 30.0–36.0)
MCV: 87.7 fL (ref 80.0–100.0)
Platelets: 211 10*3/uL (ref 150–400)
RBC: 3.51 MIL/uL — ABNORMAL LOW (ref 4.22–5.81)
RDW: 15.9 % — ABNORMAL HIGH (ref 11.5–15.5)
WBC: 8.2 10*3/uL (ref 4.0–10.5)
nRBC: 0 % (ref 0.0–0.2)

## 2023-09-22 LAB — CREATININE, SERUM
Creatinine, Ser: 8.28 mg/dL — ABNORMAL HIGH (ref 0.61–1.24)
GFR, Estimated: 7 mL/min — ABNORMAL LOW (ref >60)

## 2023-09-22 LAB — COMPREHENSIVE METABOLIC PANEL
ALT: 22 U/L (ref 0–44)
AST: 14 U/L — ABNORMAL LOW (ref 15–41)
Albumin: 3.6 g/dL (ref 3.5–5.0)
Alkaline Phosphatase: 78 U/L (ref 38–126)
Anion gap: 17 — ABNORMAL HIGH (ref 5–15)
BUN: 94 mg/dL — ABNORMAL HIGH (ref 6–20)
CO2: 24 mmol/L (ref 22–32)
Calcium: 8.5 mg/dL — ABNORMAL LOW (ref 8.9–10.3)
Chloride: 96 mmol/L — ABNORMAL LOW (ref 98–111)
Creatinine, Ser: 11.44 mg/dL — ABNORMAL HIGH (ref 0.61–1.24)
GFR, Estimated: 5 mL/min — ABNORMAL LOW (ref >60)
Glucose, Bld: 144 mg/dL — ABNORMAL HIGH (ref 70–99)
Potassium: 3.9 mmol/L (ref 3.5–5.1)
Sodium: 137 mmol/L (ref 135–145)
Total Bilirubin: 0.3 mg/dL (ref <1.2)
Total Protein: 7.3 g/dL (ref 6.5–8.1)

## 2023-09-22 LAB — TROPONIN I (HIGH SENSITIVITY)
Troponin I (High Sensitivity): 38 ng/L — ABNORMAL HIGH (ref <18)
Troponin I (High Sensitivity): 35 ng/L — ABNORMAL HIGH (ref <18)

## 2023-09-22 LAB — HEMOGLOBIN A1C
Hgb A1c MFr Bld: 9.5 % — ABNORMAL HIGH (ref 4.8–5.6)
Mean Plasma Glucose: 225.95 mg/dL

## 2023-09-22 LAB — HEPATITIS B SURFACE ANTIGEN: Hepatitis B Surface Ag: NONREACTIVE

## 2023-09-22 LAB — BRAIN NATRIURETIC PEPTIDE: B Natriuretic Peptide: 548.7 pg/mL — ABNORMAL HIGH (ref 0.0–100.0)

## 2023-09-22 MED ORDER — ONDANSETRON HCL 4 MG PO TABS: 4.0000 mg | ORAL_TABLET | Freq: Four times a day (QID) | ORAL | Status: DC | PRN

## 2023-09-22 MED ORDER — SODIUM CHLORIDE 0.9% FLUSH: 3.0000 mL | INTRAVENOUS | Status: DC | PRN

## 2023-09-22 MED ORDER — INSULIN ASPART 100 UNIT/ML IJ SOLN
0.0000 [IU] | Freq: Three times a day (TID) | INTRAMUSCULAR | Status: DC
  Administered 2023-09-23: 5 [IU] via SUBCUTANEOUS
  Filled 2023-09-22: qty 1

## 2023-09-22 MED ORDER — RENA-VITE PO TABS
1.0000 | ORAL_TABLET | Freq: Every day | ORAL | Status: DC
  Administered 2023-09-22: 1 via ORAL
  Filled 2023-09-22 (×2): qty 1

## 2023-09-22 MED ORDER — PANTOPRAZOLE SODIUM 40 MG PO TBEC
40.0000 mg | DELAYED_RELEASE_TABLET | Freq: Every day | ORAL | Status: DC
  Administered 2023-09-23: 40 mg via ORAL
  Filled 2023-09-22: qty 1

## 2023-09-22 MED ORDER — IPRATROPIUM-ALBUTEROL 0.5-2.5 (3) MG/3ML IN SOLN
3.0000 mL | Freq: Once | RESPIRATORY_TRACT | Status: AC
  Administered 2023-09-22: 3 mL via RESPIRATORY_TRACT
  Filled 2023-09-22: qty 3

## 2023-09-22 MED ORDER — CARVEDILOL 6.25 MG PO TABS
25.0000 mg | ORAL_TABLET | Freq: Two times a day (BID) | ORAL | Status: DC
  Administered 2023-09-22 – 2023-09-23 (×2): 25 mg via ORAL
  Filled 2023-09-22 (×2): qty 1

## 2023-09-22 MED ORDER — AMLODIPINE BESYLATE 5 MG PO TABS
5.0000 mg | ORAL_TABLET | Freq: Every day | ORAL | Status: DC
  Administered 2023-09-22: 5 mg via ORAL
  Filled 2023-09-22: qty 1

## 2023-09-22 MED ORDER — SUCROFERRIC OXYHYDROXIDE 500 MG PO CHEW: 500.0000 mg | CHEWABLE_TABLET | Freq: Three times a day (TID) | ORAL | Status: DC

## 2023-09-22 MED ORDER — CLONIDINE HCL 0.1 MG PO TABS
0.3000 mg | ORAL_TABLET | Freq: Three times a day (TID) | ORAL | Status: DC
  Administered 2023-09-22 – 2023-09-23 (×2): 0.3 mg via ORAL
  Filled 2023-09-22: qty 3

## 2023-09-22 MED ORDER — SODIUM CHLORIDE 0.9% FLUSH
3.0000 mL | Freq: Two times a day (BID) | INTRAVENOUS | Status: DC
  Administered 2023-09-22 – 2023-09-23 (×2): 3 mL via INTRAVENOUS

## 2023-09-22 MED ORDER — SEVELAMER CARBONATE 800 MG PO TABS: 2400.0000 mg | ORAL_TABLET | Freq: Three times a day (TID) | ORAL | Status: DC

## 2023-09-22 MED ORDER — ACETAMINOPHEN 500 MG PO TABS
1000.0000 mg | ORAL_TABLET | Freq: Four times a day (QID) | ORAL | Status: DC | PRN
  Administered 2023-09-23: 1000 mg via ORAL
  Filled 2023-09-22: qty 2

## 2023-09-22 MED ORDER — SODIUM CHLORIDE 0.9 % IV SOLN: 250.0000 mL | INTRAVENOUS | Status: AC | PRN

## 2023-09-22 MED ORDER — PENTAFLUOROPROP-TETRAFLUOROETH EX AERO: 1.0000 | INHALATION_SPRAY | CUTANEOUS | Status: DC | PRN

## 2023-09-22 MED ORDER — ASPIRIN 81 MG PO TBEC
81.0000 mg | DELAYED_RELEASE_TABLET | Freq: Every day | ORAL | Status: DC
  Administered 2023-09-23: 81 mg via ORAL
  Filled 2023-09-22: qty 1

## 2023-09-22 MED ORDER — FUROSEMIDE 40 MG PO TABS
80.0000 mg | ORAL_TABLET | Freq: Every day | ORAL | Status: DC
  Filled 2023-09-22: qty 2

## 2023-09-22 MED ORDER — ACETAMINOPHEN 325 MG PO TABS
650.0000 mg | ORAL_TABLET | Freq: Once | ORAL | Status: AC
  Administered 2023-09-22: 650 mg via ORAL
  Filled 2023-09-22: qty 2

## 2023-09-22 MED ORDER — ATORVASTATIN CALCIUM 20 MG PO TABS
80.0000 mg | ORAL_TABLET | Freq: Every day | ORAL | Status: DC
  Administered 2023-09-22 – 2023-09-23 (×2): 80 mg via ORAL
  Filled 2023-09-22 (×2): qty 4

## 2023-09-22 MED ORDER — HEPARIN SODIUM (PORCINE) 1000 UNIT/ML DIALYSIS: 1000.0000 [IU] | INTRAMUSCULAR | Status: DC | PRN

## 2023-09-22 MED ORDER — FLUTICASONE PROPIONATE 50 MCG/ACT NA SUSP: 1.0000 | Freq: Every day | NASAL | Status: DC | PRN

## 2023-09-22 MED ORDER — GABAPENTIN 100 MG PO CAPS
100.0000 mg | ORAL_CAPSULE | Freq: Three times a day (TID) | ORAL | Status: DC
  Administered 2023-09-22 – 2023-09-23 (×2): 100 mg via ORAL
  Filled 2023-09-22 (×2): qty 1

## 2023-09-22 MED ORDER — INSULIN DETEMIR 100 UNIT/ML ~~LOC~~ SOLN
50.0000 [IU] | Freq: Every day | SUBCUTANEOUS | Status: DC
  Filled 2023-09-22: qty 0.5

## 2023-09-22 MED ORDER — CLOPIDOGREL BISULFATE 75 MG PO TABS
75.0000 mg | ORAL_TABLET | Freq: Every day | ORAL | Status: DC
  Administered 2023-09-23: 75 mg via ORAL
  Filled 2023-09-22: qty 1

## 2023-09-22 MED ORDER — ONDANSETRON HCL 4 MG/2ML IJ SOLN: 4.0000 mg | Freq: Four times a day (QID) | INTRAMUSCULAR | Status: DC | PRN

## 2023-09-22 MED ORDER — EZETIMIBE 10 MG PO TABS: 10.0000 mg | ORAL_TABLET | Freq: Every evening | ORAL | Status: DC

## 2023-09-22 MED ORDER — ALLOPURINOL 100 MG PO TABS
200.0000 mg | ORAL_TABLET | Freq: Every day | ORAL | Status: DC
  Administered 2023-09-23: 200 mg via ORAL
  Filled 2023-09-22: qty 2

## 2023-09-22 MED ORDER — LIDOCAINE-PRILOCAINE 2.5-2.5 % EX CREA
1.0000 | TOPICAL_CREAM | CUTANEOUS | Status: DC | PRN
Start: 2023-09-22 — End: 2023-09-22

## 2023-09-22 MED ORDER — LINACLOTIDE 145 MCG PO CAPS
145.0000 ug | ORAL_CAPSULE | Freq: Every day | ORAL | Status: DC
Start: 2023-09-23 — End: 2023-09-23
  Filled 2023-09-22: qty 1

## 2023-09-22 MED ORDER — HEPARIN SODIUM (PORCINE) 5000 UNIT/ML IJ SOLN
5000.0000 [IU] | Freq: Three times a day (TID) | INTRAMUSCULAR | Status: DC
  Administered 2023-09-22 – 2023-09-23 (×2): 5000 [IU] via SUBCUTANEOUS
  Filled 2023-09-22 (×2): qty 1

## 2023-09-22 MED ORDER — ALBUTEROL SULFATE (2.5 MG/3ML) 0.083% IN NEBU: 2.5000 mg | INHALATION_SOLUTION | RESPIRATORY_TRACT | Status: DC | PRN

## 2023-09-22 MED ORDER — CHLORHEXIDINE GLUCONATE CLOTH 2 % EX PADS
6.0000 | MEDICATED_PAD | Freq: Every day | CUTANEOUS | Status: DC
  Filled 2023-09-22 (×2): qty 6

## 2023-09-22 NOTE — Progress Notes (Signed)
[facility]  ROUNDING NOTE   Subjective:   Kelly Key is a 52 y.o. male with past medical history of COPD, diabetes, gout, HLP, Hypertension and end stage renal disease on hemodialysis. He presents to ED with shortness of breath. States he went to his dialysis treatment on Saturday but feels like they did not take enough fluid off.   He is known to our practice and receives outpatient dialysis treatments at [facility] on a TTS schedule. He is seen sitting on stretcher, visibly uncomfortable. Room air. Family at bedside. States he has also drank more than he should have.  Complains of dry mouth.  Reports he is unable to lay flat for an extended period.   Labs unremarkable for renal patient.  BUN elevated however patient has recently been prescribed steroids.  BNP 548.  Chest x-ray shows cardiac enlargement and no acute findings compared to previous imaging.  We have been consulted to provide dialysis.   Objective:  Vital signs in last 24 hours:  Temp:  [97.6 F (36.4 C)-98 F (36.7 C)] 98 F (36.7 C) (11/26 1445) Pulse Rate:  [77-95] 79 (11/26 1445) Resp:  [13-22] 20 (11/26 1445) BP: (152-182)/(84-137) 182/95 (11/26 1445) SpO2:  [94 %-100 %] 99 % (11/26 1445) Weight:  [136.1 kg-154.2 kg] 151.3 kg (11/26 1445)  Weight change:  Filed Weights   09/22/23 0637 09/22/23 1101 09/22/23 1445  Weight: 136.1 kg (!) 154.2 kg (!) 151.3 kg    Intake/Output: No intake/output data recorded.   Intake/Output this shift:  Total I/O In: -  Out: 3225 [Urine:225; Other:3000]  Physical Exam: General: Ill-appearing  Head: Normocephalic, atraumatic. Moist oral mucosal membranes  Eyes: Anicteric  Lungs:  Clear to auscultation, tachypneic  Heart: Regular rate and rhythm  Abdomen:  Soft, nontender, obese  Extremities: Nonpitting peripheral edema.  Neurologic: Alert and oriented, moving all four extremities  Skin: No lesions  Access: Left upper aVF    Basic Metabolic  Panel: Recent Labs  Lab 09/22/23 0649  NA 137  K 3.9  CL 96*  CO2 24  GLUCOSE 144*  BUN 94*  CREATININE 11.44*  CALCIUM 8.5*    Liver Function Tests: Recent Labs  Lab 09/22/23 0649  AST 14*  ALT 22  ALKPHOS 78  BILITOT 0.3  PROT 7.3  ALBUMIN 3.6   No results for input(s): "LIPASE", "AMYLASE" in the last 168 hours. No results for input(s): "AMMONIA" in the last 168 hours.  CBC: Recent Labs  Lab 09/22/23 0649  WBC 9.1  NEUTROABS 5.5  HGB 9.7*  HCT 29.4*  MCV 87.5  PLT 234    Cardiac Enzymes: No results for input(s): "CKTOTAL", "CKMB", "CKMBINDEX", "TROPONINI" in the last 168 hours.  BNP: Invalid input(s): "POCBNP"  CBG: No results for input(s): "GLUCAP" in the last 168 hours.  Microbiology: Results for orders placed or performed during the hospital encounter of 09/14/23  Resp panel by RT-PCR (RSV, Flu A&B, Covid) Anterior Nasal Swab     Status: None   Collection Time: 09/14/23  7:37 AM   Specimen: Anterior Nasal Swab  Result Value Ref Range Status   SARS Coronavirus 2 by RT PCR NEGATIVE NEGATIVE Final    Comment: (NOTE) SARS-CoV-2 target nucleic acids are NOT DETECTED.  The SARS-CoV-2 RNA is generally detectable in upper respiratory specimens during the acute phase of infection. The lowest concentration of SARS-CoV-2 viral copies this assay can detect is 138 copies/mL. A negative result does not preclude SARS-Cov-2 infection and should not  be used as the sole basis for treatment or other patient management decisions. A negative result may occur with  improper specimen collection/handling, submission of specimen other than nasopharyngeal swab, presence of viral mutation(s) within the areas targeted by this assay, and inadequate number of viral copies(<138 copies/mL). A negative result must be combined with clinical observations, patient history, and epidemiological information. The expected result is Negative.  Fact Sheet for Patients:   BloggerCourse.com  Fact Sheet for Healthcare Providers:  SeriousBroker.it  This test is no t yet approved or cleared by the [place] FDA and  has been authorized for detection and/or diagnosis of SARS-CoV-2 by FDA under an Emergency Use Authorization (EUA). This EUA will remain  in effect (meaning this test can be used) for the duration of the COVID-19 declaration under Section 564(b)(1) of the Act, 21 U.S.C.section 360bbb-3(b)(1), unless the authorization is terminated  or revoked sooner.       Influenza A by PCR NEGATIVE NEGATIVE Final   Influenza B by PCR NEGATIVE NEGATIVE Final    Comment: (NOTE) The Xpert Xpress SARS-CoV-2/FLU/RSV plus assay is intended as an aid in the diagnosis of influenza from Nasopharyngeal swab specimens and should not be used as a sole basis for treatment. Nasal washings and aspirates are unacceptable for Xpert Xpress SARS-CoV-2/FLU/RSV testing.  Fact Sheet for Patients: BloggerCourse.com  Fact Sheet for Healthcare Providers: SeriousBroker.it  This test is not yet approved or cleared by the [place] FDA and has been authorized for detection and/or diagnosis of SARS-CoV-2 by FDA under an Emergency Use Authorization (EUA). This EUA will remain in effect (meaning this test can be used) for the duration of the COVID-19 declaration under Section 564(b)(1) of the Act, 21 U.S.C. section 360bbb-3(b)(1), unless the authorization is terminated or revoked.     Resp Syncytial Virus by PCR NEGATIVE NEGATIVE Final    Comment: (NOTE) Fact Sheet for Patients: BloggerCourse.com  Fact Sheet for Healthcare Providers: SeriousBroker.it  This test is not yet approved or cleared by the [place] FDA and has been authorized for detection and/or diagnosis of SARS-CoV-2 by FDA under an Emergency Use  Authorization (EUA). This EUA will remain in effect (meaning this test can be used) for the duration of the COVID-19 declaration under Section 564(b)(1) of the Act, 21 U.S.C. section 360bbb-3(b)(1), unless the authorization is terminated or revoked.  Performed at [facility], [address], [place], [place] [address]     Coagulation Studies: No results for input(s): "LABPROT", "INR" in the last 72 hours.  Urinalysis: No results for input(s): "COLORURINE", "LABSPEC", "PHURINE", "GLUCOSEU", "HGBUR", "BILIRUBINUR", "KETONESUR", "PROTEINUR", "UROBILINOGEN", "NITRITE", "LEUKOCYTESUR" in the last 72 hours.  Invalid input(s): "APPERANCEUR"    Imaging: DG Chest Port 1 View  Result Date: 09/22/2023 CLINICAL DATA:  Shortness of breath EXAM: PORTABLE CHEST 1 VIEW COMPARISON:  09/14/2023 FINDINGS: Cardiac enlargement. Aortic and hilar contours are stable. There is no edema, consolidation, effusion, or pneumothorax. IMPRESSION: No acute finding when compared to prior. Electronically Signed   By: Tiburcio Pea M.D.   On: 09/22/2023 07:01   OCT, Retina - OU - Both Eyes  Result Date: 09/21/2023 Right Eye Quality was good. Central Foveal Thickness: 266. Progression has improved. Findings include no SRF, abnormal foveal contour, intraretinal hyper-reflective material, intraretinal fluid, vitreomacular adhesion (Stable improvement in central IRF inferior macula and fovea, stable improvement in SRF, scattered punctate IRHM - improved). Left Eye Central Foveal Thickness: 284. Progression has been stable. Findings include no SRF, abnormal foveal contour, intraretinal hyper-reflective  material, vitreomacular adhesion ( persistent IRF/cystic changes IT fovea and macula). Notes *Images captured and stored on drive Diagnosis / Impression: +DME OU (OD > OS) OD: Stable improvement in central IRF inferior macula and fovea, stable improvement in SRF, scattered punctate IRHM -- improved OS: persistent IRF/cystic  changes IT fovea and macula Clinical management: See below Abbreviations: NFP - Normal foveal profile. CME - cystoid macular edema. PED - pigment epithelial detachment. IRF - intraretinal fluid. SRF - subretinal fluid. EZ - ellipsoid zone. ERM - epiretinal membrane. ORA - outer retinal atrophy. ORT - outer retinal tubulation. SRHM - subretinal hyper-reflective material. IRHM - intraretinal hyper-reflective material     Medications:     Chlorhexidine Gluconate Cloth  6 each Topical Q0600   heparin, lidocaine-prilocaine, pentafluoroprop-tetrafluoroeth  Assessment/ Plan:  Mr. ADNAN GECK is a 52 y.o.  male with past medical history of COPD, diabetes, gout, HLP, Hypertension and end stage renal disease on hemodialysis. He presents to ED with shortness of breath. States he went to his dialysis treatment on Saturday but feels like they did not take enough fluid off.   [facility] [place]/TTS/ Lt AVF  Acute respiratory distress likely due to fluid volume overload. Chest xray shows cardiac enlargement. Review of outpatient records show patient remains significantly over his EDW. Will need dialysis for fluid removal.   2. End stage renal disease on hemodialysis. Last treatment received on Saturday. Will provide dialysis today and attempt UF 3.5-4L.  Patient agreeable to UF only treatment tomorrow for additional fluid removal.  3. Anemia of chronic kidney disease Lab Results  Component Value Date   HGB 9.7 (L) 09/22/2023    Patient receives Mircera at outpatient clinic.  Will monitor hemoglobin and assess need for ESA during this admission.  4. Secondary Hyperparathyroidism:   Lab Results  Component Value Date   CALCIUM 8.5 (L) 09/22/2023   CAION 1.10 (L) 11/11/2019   PHOS 5.4 (H) 05/06/2023    Will continue to monitor bone minerals during this admission.   LOS: 0 Savanna Dooley 11/26/20244:02 PM

## 2023-09-22 NOTE — ED Notes (Signed)
Ekgx2 complete prior to arrival to room 4 ED physician @ bs code cart @ bs blood drawn labeled sent to lab

## 2023-09-22 NOTE — ED Triage Notes (Signed)
Patient ambulatory to triage with steady gait, without difficulty or distress noted; pt reports SHOB since last night accomp by nonprod cough; denies pain, dialysis due this morning and has not been completed since last wk

## 2023-09-22 NOTE — ED Provider Triage Note (Signed)
Emergency Medicine Provider Triage Evaluation Note  Kelly Key , a 52 y.o. male  was evaluated in triage.  Pt complains of SOB and chest pain.  Review of Systems  Positive: Shortness of breath, chest tightness or discomfort, all in the setting of chronic kidney disease on hemodialysis and this morning is his dialysis morning. Negative: Abdominal pain, nausea, vomiting  Physical Exam  Ht 1.676 m (5\' 6" )   Wt 136.1 kg   BMI 48.42 kg/m  Gen:   Awake, alert, and anxious. Resp:  Tachypnea but lungs are clear to auscultation MSK:   Moves extremities without difficulty , no focal neurological deficits  Medical Decision Making  Medically screening exam initiated at 6:54 AM.  Appropriate orders placed.  Kelly Key was informed that the remainder of the evaluation will be completed by another provider, this initial triage assessment does not replace that evaluation, and the importance of remaining in the ED until their evaluation is complete.  Initial EKG from triage had extensive artifact and was interpreted by the computer as a STEMI, but I did not feel it was diagnostic and ask for repeat.  Second EKG is much more clear and not indicative of acute ischemia nor STEMI.  I talked with the patient and his partner who are in the room and we are initiating workup, getting IV started, cardiac monitoring, etc.  Patient said he thinks he will feel better with the breathing treatment even though I suspect the issues volume overload, but I ordered a DuoNeb for his comfort.  Stable for the next provider.   Loleta Rose, MD 09/22/23 [phone]

## 2023-09-22 NOTE — Assessment & Plan Note (Signed)
>>  ASSESSMENT AND PLAN FOR MORBID OBESITY (HCC) WRITTEN ON 09/22/2023  5:55 PM BY AGBATA, TOCHUKWU, MD  BMI 53.8 Complicates overall prognosis and care Lifestyle modification and exercise has been discussed with patient in detail

## 2023-09-22 NOTE — ED Notes (Signed)
Pt taken to dialysis

## 2023-09-22 NOTE — Assessment & Plan Note (Signed)
Continue carvedilol, aspirin, Plavix and atorvastatin

## 2023-09-22 NOTE — H&P (Signed)
History and Physical    Patient: Kelly Key RUE:[id number] DOB: [birth year] DOA: 09/22/2023 DOS: the patient was seen and examined on 09/22/2023 PCP: Pearson Grippe, MD  Patient coming from: Home  Chief Complaint:  Chief Complaint  Patient presents with   Shortness of Breath   HPI: Kelly Key is a 52 y.o. male with medical history significant for chronic diastolic dysfunction CHF with last known LVEF of 60 to 65% from a 2D echocardiogram which was done 03/24, history of diabetes mellitus with complications of end-stage renal disease on hemodialysis (T/TH/S), hypertension, morbid obesity with a BMI of 53 who presents to the emergency room for evaluation of sudden onset shortness of breath. Patient states that he has not missed any of his dialysis sessions.  He woke up this morning with sudden onset shortness of breath and felt he was unable to go to the dialysis center so he presented to the emergency room.  Upon arrival to the ER blood pressure was significantly elevated at 181/90.  He also complained of a nonproductive cough as well as bilateral lower extremity swelling. He denies having any chest pain, no nausea, no vomiting, no abdominal pain, no dizziness, no lightheadedness, no headache, no blurred vision, no focal deficits He was seen in consultation by nephrology and had renal replacement therapy today but still remains short of breath. Twelve-lead EKG reviewed by me shows sinus rhythm with first-degree AV block Chest x-ray shows no edema, consolidation, effusion, or pneumothorax.  Nephrologist has requested hospitalization for dialysis session in the morning prior to discharge.    Review of Systems: As mentioned in the history of present illness. All other systems reviewed and are negative. Past Medical History:  Diagnosis Date   Acute diastolic CHF (congestive heart failure) (HCC) 06/11/2012   EF 50-55% [facility])   Chronic kidney disease    COPD (chronic obstructive  pulmonary disease) (HCC)    Diabetes mellitus    A1c 11.5 (06/11/2012).   Dyspnea    Gout    Hepatic steatosis 06/11/2012   Elevated LFTs   Hyperlipemia    Malignant hypertension    Microcytic anemia 06/12/2012   Obesity    Past Surgical History:  Procedure Laterality Date   ABDOMINAL AORTOGRAM W/LOWER EXTREMITY N/A 12/01/2022   Procedure: ABDOMINAL AORTOGRAM W/LOWER EXTREMITY;  Surgeon: Cephus Shelling, MD;  Location: [facility] INVASIVE CV LAB;  Service: Cardiovascular;  Laterality: N/A;   AMPUTATION Left 12/03/2022   Procedure: LEFT SECOND TOE AMPUTATION;  Surgeon: Nadara Mustard, MD;  Location: [facility] OR;  Service: Orthopedics;  Laterality: Left;   AV FISTULA PLACEMENT Left 11/11/2019   Procedure: ARTERIOVENOUS (AV) FISTULA CREATION LEFT BRACHIOCEPHALIC ARM;  Surgeon: Nada Libman, MD;  Location: [facility] OR;  Service: Vascular;  Laterality: Left;   COLONOSCOPY WITH PROPOFOL N/A 09/02/2021   Procedure: COLONOSCOPY WITH PROPOFOL;  Surgeon: Wyline Mood, MD;  Location: [facility] ENDOSCOPY;  Service: Gastroenterology;  Laterality: N/A;   IR FLUORO GUIDE CV LINE RIGHT  07/08/2019   IR REMOVAL TUN CV CATH W/O FL  03/23/2020   IR US GUIDE VASC ACCESS RIGHT  07/08/2019   LEFT HEART CATH AND CORONARY ANGIOGRAPHY N/A 06/12/2021   Procedure: LEFT HEART CATH AND CORONARY ANGIOGRAPHY;  Surgeon: Lyn Records, MD;  Location: [facility] INVASIVE CV LAB;  Service: Cardiovascular;  Laterality: N/A;   NO PAST SURGERIES     PERIPHERAL VASCULAR BALLOON ANGIOPLASTY Left 12/01/2022   Procedure: PERIPHERAL VASCULAR BALLOON ANGIOPLASTY;  Surgeon: Cephus Shelling, MD;  Location: [facility] INVASIVE CV LAB;  Service: Cardiovascular;  Laterality: Left;  Anterior tibial   PERIPHERAL VASCULAR INTERVENTION Left 12/01/2022   Procedure: PERIPHERAL VASCULAR INTERVENTION;  Surgeon: Cephus Shelling, MD;  Location: [facility] INVASIVE CV LAB;  Service: Cardiovascular;  Laterality: Left;  sfa   VASCULAR SURGERY     Social History:  reports that he has never  smoked. He has been exposed to tobacco smoke. He has never used smokeless tobacco. He reports that he does not currently use alcohol. He reports that he does not use drugs.  Allergies  Allergen Reactions   Ozempic (0.25 Or 0.5 Mg-Dose) [Semaglutide(0.25 Or 0.5mg -Dos)] Shortness Of Breath   Hydralazine Anxiety    Hallucinations    Lisinopril Swelling    Family History  Problem Relation Age of Onset   Gout Mother    Asthma Mother    Diabetes Father    Heart failure Father    Diabetes Sister    Hypertension Brother    Pancreatic cancer Brother    Diabetes Sister     Prior to Admission medications   Medication Sig Start Date End Date Taking? Authorizing Provider  acetaminophen (TYLENOL) 500 MG tablet Take 1,000 mg by mouth every 6 (six) hours as needed for moderate pain or headache.    [provider]  albuterol (VENTOLIN HFA) 108 (90 Base) MCG/ACT inhaler Inhale 2 puffs into the lungs every 4 (four) hours as needed for wheezing or shortness of breath. 05/06/23   Shon Hale, MD  allopurinol (ZYLOPRIM) 100 MG tablet Take 2 tablets (200 mg total) by mouth daily. 08/04/19   Love, Evlyn Kanner, PA-C  AMBULATORY NON FORMULARY MEDICATION Inject 0.2 ml by intracavernosal route as directed.  Medication Name: TriMix PGE 30 mcg Pap   30 mg Phent 1 mg 07/01/23   Malen Gauze, MD  amLODipine (NORVASC) 5 MG tablet Take 5 mg by mouth daily. 01/30/22   [provider]  aspirin EC 81 MG tablet Take 1 tablet (81 mg total) by mouth daily. 05/06/23   Shon Hale, MD  atorvastatin (LIPITOR) 80 MG tablet Take 1 tablet (80 mg total) by mouth daily. 05/06/23   Shon Hale, MD  carvedilol (COREG) 25 MG tablet Take 1 tablet (25 mg total) by mouth 2 (two) times daily with a meal. 05/06/23   Emokpae, Courage, MD  cloNIDine (CATAPRES) 0.3 MG tablet Take 1 tablet (0.3 mg total) by mouth 3 (three) times daily. 05/06/23   Shon Hale, MD  clopidogrel (PLAVIX) 75 MG tablet Take 75  mg by mouth daily. 08/26/23   [provider]  esomeprazole (NEXIUM) 20 MG capsule Take 20 mg by mouth daily. 01/23/22   [provider]  ezetimibe (ZETIA) 10 MG tablet Take 10 mg by mouth every evening. 01/20/19   [provider]  fluticasone (FLONASE) 50 MCG/ACT nasal spray Place 1 spray into both nostrils daily as needed for allergies or rhinitis.    [provider]  furosemide (LASIX) 80 MG tablet Take 80 mg by mouth daily.    [provider]  gabapentin (NEURONTIN) 100 MG capsule Take 1 capsule (100 mg total) by mouth 3 (three) times daily. 08/03/19   Love, Evlyn Kanner, PA-C  glucose blood (ACCU-CHEK AVIVA PLUS) test strip Use as instructed TO CHECK BLOOD GLUCOSE THREE TIMES DAILY 08/15/20   Nida, Denman George, MD  HUMALOG KWIKPEN 100 UNIT/ML KwikPen Inject 8-30 Units into the skin 3 (three) times daily. Sliding scale 30 units max TID 08/19/22  [provider]  insulin detemir (LEVEMIR FLEXTOUCH) 100 UNIT/ML FlexPen Inject 50 Units into the skin at bedtime. 05/06/23   Emokpae, Courage, MD  Insulin Pen Needle (B-D ULTRAFINE III SHORT PEN) 31G X 8 MM MISC 1 each by Does not apply route as directed. 08/13/20   Roma Kayser, MD  Ipratropium-Albuterol (COMBIVENT) 20-100 MCG/ACT AERS respimat Inhale 1 puff into the lungs every 6 (six) hours as needed for wheezing or shortness of breath. 05/06/23 06/05/23  Shon Hale, MD  linaclotide (LINZESS) 145 MCG CAPS capsule Take 145 mcg by mouth daily before breakfast.    [provider]  multivitamin (RENA-VIT) TABS tablet Take 1 tablet by mouth at bedtime. 08/03/19   Love, Evlyn Kanner, PA-C  sevelamer carbonate (RENVELA) 800 MG tablet Take 3 tablets (2,400 mg total) by mouth 3 (three) times daily with meals. Patient taking differently: Take 2,400 mg by mouth 3 (three) times daily with meals. And once daily with snacks 08/03/19   Love, Evlyn Kanner, PA-C  VELPHORO 500 MG chewable tablet Chew 500 mg  by mouth 3 (three) times daily. 08/13/23   [provider]    Physical Exam: Vitals:   09/22/23 1430 09/22/23 1445 09/22/23 1728 09/22/23 1753  BP: (!) 152/137 (!) 182/95 (!) 174/96   Pulse: 82 79 81   Resp:  20 13   Temp:  98 F (36.7 C)  98 F (36.7 C)  TempSrc:  Oral  Oral  SpO2: 98% 99% 99%   Weight:  (!) 151.3 kg    Height:       Physical Exam Vitals and nursing note reviewed.  Constitutional:      Appearance: He is obese.  HENT:     Head: Normocephalic and atraumatic.     Mouth/Throat:     Mouth: Mucous membranes are moist.  Cardiovascular:     Rate and Rhythm: Normal rate and regular rhythm.  Pulmonary:     Effort: Tachypnea present.     Breath sounds: Examination of the right-lower field reveals rales. Examination of the left-lower field reveals rales. Rales present.  Abdominal:     General: Bowel sounds are normal.     Palpations: Abdomen is soft.     Comments: Central adiposity  Musculoskeletal:     Cervical back: Normal range of motion and neck supple.     Right lower leg: Edema present.     Left lower leg: Edema present.  Skin:    General: Skin is warm and dry.  Neurological:     General: No focal deficit present.  Psychiatric:        Mood and Affect: Mood normal.        Behavior: Behavior normal.     Data Reviewed: Relevant notes from primary care and specialist visits, past discharge summaries as available in EHR, including Care Everywhere. Prior diagnostic testing as pertinent to current admission diagnoses Updated medications and problem lists for reconciliation ED course, including vitals, labs, imaging, treatment and response to treatment Triage notes, nursing and pharmacy notes and ED provider's notes Notable results as noted in HPI Labs reviewed.  BNP 548, troponin 35, sodium 137, potassium 3.9, chloride 96, bicarb 24, glucose 144, BUN 94, creatinine 11.4, calcium 8.5, total protein 7.3, albumin 3.6, AST 14, ALT 22, alkaline  phosphatase 78, total bilirubin 0.3, white count 9.1, hemoglobin 9.7, hematocrit 29.4, platelet count 234  There are no new results to review at this time.  Assessment and Plan: * Acute on chronic diastolic  CHF (congestive heart failure) (HCC) Secondary to hypertensive heart disease 2D echocardiogram from 03/24 shows an LVEF of 60 to 65% with grade 2 diastolic dysfunction. Patient has end-stage renal disease and requires renal replacement therapy for volume status management Optimize blood pressure control Continue furosemide on nondialysis days Nephrology consult for renal replacement therapy  Elevated troponin Troponin is flat Secondary to demand ischemia from hypertensive urgency Patient denies having any chest pain and EKG does not show any acute ST or T wave changes  Hypertensive urgency Optimize blood pressure control Patient is on multiple antihypertensive medications which will be resumed during this hospitalization and include clonidine, carvedilol and amlodipine  PAD (peripheral artery disease) (HCC) Continue carvedilol, aspirin, Plavix and atorvastatin  ESRD (end stage renal disease) (HCC) Dialysis days are T/TH/S Nephrology consult for renal replacement therapy during this hospitalization  Diabetes mellitus type 2 in nonobese (HCC) With complications of end-stage renal disease on hemodialysis Continue long-acting insulin with sliding scale coverage Maintain consistent carbohydrate/renal diet  Morbid obesity (HCC) BMI 53.8 Complicates overall prognosis and care Lifestyle modification and exercise has been discussed with patient in detail      Advance Care Planning:   Code Status: Full Code   Consults: Nephrology  Family Communication: Plan of care discussed with patient and his wife at the bedside.  All questions and concerns have been addressed.  They verbalized understanding and agree with the  Severity of Illness: The appropriate patient status for this  patient is OBSERVATION. Observation status is judged to be reasonable and necessary in order to provide the required intensity of service to ensure the patient's safety. The patient's presenting symptoms, physical exam findings, and initial radiographic and laboratory data in the context of their medical condition is felt to place them at decreased risk for further clinical deterioration. Furthermore, it is anticipated that the patient will be medically stable for discharge from the hospital within 2 midnights of admission.   Author: Lucile Shutters, MD 09/22/2023 5:55 PM  For on call review [email or web address].

## 2023-09-22 NOTE — ED Notes (Signed)
Report given to Avis, RN in dialysis.

## 2023-09-22 NOTE — ED Provider Notes (Signed)
[facility] Provider Note    Event Date/Time   First MD Initiated Contact with Patient 09/22/23 [phone]     (approximate)   History   Shortness of Breath   HPI  Kelly Key is a 52 y.o. male with history of end-stage renal disease here with shortness of breath.  The patient states he feels like he has significant amount of fluid on him.  He states that he went to dialysis on Saturday, but they were only able to pull off so much fluid.  He went on 11/18 and felt short of breath, and felt like he needed an additional session then but was sent home.  He told Dr. Lourdes Sledge and was told to come to the ED when he got short of breath again.  Denies any pain.  States he can barely walk across the room without getting extremely winded.  He is unsure how much she is up from his dry weight, which she states is around 296 pounds.     Physical Exam   Triage Vital Signs: ED Triage Vitals  Encounter Vitals Group     BP 09/22/23 0655 (!) 181/90     Systolic BP Percentile --      Diastolic BP Percentile --      Pulse Rate 09/22/23 0655 95     Resp 09/22/23 0655 20     Temp 09/22/23 0655 97.8 F (36.6 C)     Temp Source 09/22/23 0655 Oral     SpO2 09/22/23 0655 97 %     Weight 09/22/23 0637 300 lb (136.1 kg)     Height 09/22/23 0637 5\' 6"  (1.676 m)     Head Circumference --      Peak Flow --      Pain Score 09/22/23 0637 0     Pain Loc --      Pain Education --      Exclude from Growth Chart --     Most recent vital signs: Vitals:   09/22/23 1728 09/22/23 1753  BP: (!) 174/96   Pulse: 81   Resp: 13   Temp:  98 F (36.7 C)  SpO2: 99%      General: Awake, no distress.  CV:  Good peripheral perfusion. RRR. Resp:  Slight tachypnea, bilateral rales, diminished aeration diffusely. Abd:  No distention.  No tenderness.  No guarding or rebound. Other:  2+ pitting edema bilateral lower extremities.   ED Results / Procedures / Treatments   Labs (all labs  ordered are listed, but only abnormal results are displayed) Labs Reviewed  BRAIN NATRIURETIC PEPTIDE - Abnormal; Notable for the following components:      Result Value   B Natriuretic Peptide 548.7 (*)    All other components within normal limits  COMPREHENSIVE METABOLIC PANEL - Abnormal; Notable for the following components:   Chloride 96 (*)    Glucose, Bld 144 (*)    BUN 94 (*)    Creatinine, Ser 11.44 (*)    Calcium 8.5 (*)    AST 14 (*)    GFR, Estimated 5 (*)    Anion gap 17 (*)    All other components within normal limits  CBC WITH DIFFERENTIAL/PLATELET - Abnormal; Notable for the following components:   RBC 3.36 (*)    Hemoglobin 9.7 (*)    HCT 29.4 (*)    RDW 15.9 (*)    Eosinophils Absolute 1.0 (*)    All other components within normal limits  TROPONIN I (HIGH SENSITIVITY) - Abnormal; Notable for the following components:   Troponin I (High Sensitivity) 38 (*)    All other components within normal limits  TROPONIN I (HIGH SENSITIVITY) - Abnormal; Notable for the following components:   Troponin I (High Sensitivity) 35 (*)    All other components within normal limits  HEPATITIS B SURFACE ANTIGEN  HEPATITIS B SURFACE ANTIBODY, QUANTITATIVE  HEMOGLOBIN A1C  CBC  CREATININE, SERUM  CBC  BASIC METABOLIC PANEL     EKG Normal sinus rhythm, Triklo at 89.  PR 226, QRS 106, QTc 491.  No acute ST ovation's or depressions are negative as of acute ischemia or infarct.   RADIOLOGY : No acute findings   I also independently reviewed and agree with radiologist interpretations.   PROCEDURES:  Critical Care performed: No   MEDICATIONS ORDERED IN ED: Medications  Chlorhexidine Gluconate Cloth 2 % PADS 6 each (6 each Topical Not Given 09/22/23 1844)  acetaminophen (TYLENOL) tablet 1,000 mg (has no administration in time range)  allopurinol (ZYLOPRIM) tablet 200 mg (has no administration in time range)  aspirin EC tablet 81 mg (has no administration in time range)   amLODipine (NORVASC) tablet 5 mg (has no administration in time range)  atorvastatin (LIPITOR) tablet 80 mg (has no administration in time range)  carvedilol (COREG) tablet 25 mg (has no administration in time range)  cloNIDine (CATAPRES) tablet 0.3 mg (has no administration in time range)  ezetimibe (ZETIA) tablet 10 mg (has no administration in time range)  furosemide (LASIX) tablet 80 mg (has no administration in time range)  insulin detemir (LEVEMIR) FlexPen 50 Units (has no administration in time range)  pantoprazole (PROTONIX) EC tablet 40 mg (has no administration in time range)  linaclotide (LINZESS) capsule 145 mcg (has no administration in time range)  sevelamer carbonate (RENVELA) tablet 2,400 mg (has no administration in time range)  sucroferric oxyhydroxide (VELPHORO) chewable tablet 500 mg (has no administration in time range)  clopidogrel (PLAVIX) tablet 75 mg (has no administration in time range)  gabapentin (NEURONTIN) capsule 100 mg (has no administration in time range)  multivitamin (RENA-VIT) tablet 1 tablet (has no administration in time range)  albuterol (PROVENTIL) (2.5 MG/3ML) 0.083% nebulizer solution 2.5 mg (has no administration in time range)  fluticasone (FLONASE) 50 MCG/ACT nasal spray 1 spray (has no administration in time range)  insulin aspart (novoLOG) injection 0-6 Units (has no administration in time range)  heparin injection 5,000 Units (has no administration in time range)  sodium chloride flush (NS) 0.9 % injection 3 mL (has no administration in time range)  sodium chloride flush (NS) 0.9 % injection 3 mL (has no administration in time range)  0.9 %  sodium chloride infusion (has no administration in time range)  ondansetron (ZOFRAN) tablet 4 mg (has no administration in time range)    Or  ondansetron (ZOFRAN) injection 4 mg (has no administration in time range)  ipratropium-albuterol (DUONEB) 0.5-2.5 (3) MG/3ML nebulizer solution 3 mL (3 mLs  Nebulization Given 09/22/23 0708)  acetaminophen (TYLENOL) tablet 650 mg (650 mg Oral Given 09/22/23 1724)     IMPRESSION / MDM / ASSESSMENT AND PLAN / ED COURSE  I reviewed the triage vital signs and the nursing notes.                              Differential diagnosis includes, but is not limited to, pulmonary edema, hypervolemia, CHF, pneumonia, COPD, anemia  Patient's presentation is most consistent with acute presentation with potential threat to life or bodily function.  The patient is on the cardiac monitor to evaluate for evidence of arrhythmia and/or significant heart rate changes   52 yo M here with SOB. Pt has ESRD and h/o recent ED visits for SOB, suspect he needs back to back dialysis sessions. CXR shows mild vascular congestion. Trop slightly elevated, EKG nonischemic. Np CP. CBC shows baseline anemia. CMP with BUN 94. K is wnl. BNP elevated. Discussed with Nephrology who will take pt to HD, reassess if pt needs admission vs d/c.    FINAL CLINICAL IMPRESSION(S) / ED DIAGNOSES   Final diagnoses:  Hypervolemia, unspecified hypervolemia type  Shortness of breath  ESRD on dialysis [facility])     Rx / DC Orders   ED Discharge Orders     None        Note:  This document was prepared using Dragon voice recognition software and may include unintentional dictation errors.   Shaune Pollack, MD 09/22/23 2107

## 2023-09-22 NOTE — Progress Notes (Signed)
Received patient in bed to unit.    Informed consent signed and in chart.    TX duration: 3.5 hrs     Transported back to floor  Hand-off given to patient's nurse.   Access used:  lt avf Access issues:  N/a Total UF removed: 3000 mls Post HD weight: 151.3 kg      Maple Hudson, RN Dialysis Unit

## 2023-09-22 NOTE — Assessment & Plan Note (Addendum)
Secondary to hypertensive heart disease 2D echocardiogram from 03/24 shows an LVEF of 60 to 65% with grade 2 diastolic dysfunction. Patient has end-stage renal disease and requires renal replacement therapy for volume status management Optimize blood pressure control Continue furosemide on nondialysis days Nephrology consult for renal replacement therapy

## 2023-09-22 NOTE — Assessment & Plan Note (Signed)
Dialysis days are T/TH/S Nephrology consult for renal replacement therapy during this hospitalization

## 2023-09-22 NOTE — ED Notes (Signed)
The pt is lying supine in the bed with his head elevated. The pt is warm, pink, and dry. The pt is also alert and oriented x 4. The pt advised he has had increasing SOB over the last couple of days. The pt is a dialysis pt. The pt advised he did complete all of his recent dialysis appointments. The pt stated he has had increased swelling in his lower extremities. Wife is at bedside.

## 2023-09-22 NOTE — Assessment & Plan Note (Signed)
Troponin is flat Secondary to demand ischemia from hypertensive urgency Patient denies having any chest pain and EKG does not show any acute ST or T wave changes

## 2023-09-22 NOTE — ED Provider Notes (Signed)
  Physical Exam  BP (!) 182/95 (BP Location: Right Arm)   Pulse 79   Temp 98 F (36.7 C) (Oral)   Resp 20   Ht 5\' 6"  (1.676 m)   Wt (!) 151.3 kg   SpO2 99%   BMI 53.84 kg/m   Physical Exam  Procedures  Procedures  ED Course / MDM    Medical Decision Making Amount and/or Complexity of Data Reviewed Labs: ordered. Radiology: ordered.  Risk OTC drugs. Prescription drug management. Decision regarding hospitalization.   Received patient in signout.  52 year old male with ESRD on dialysis presenting today for shortness of breath.  Was found to have noticeable volume overload and sent in by his nephrology team.  Nephrology took him to dialysis.  Afterwards, patient still having shortness of breath noted by nephrology.  They are requesting admission for repeat dialysis tomorrow and reassessment for potential discharge.  Patient admitted to hospitalist for further care.   Janith Lima, MD 09/22/23 [phone]

## 2023-09-22 NOTE — Assessment & Plan Note (Signed)
With complications of end-stage renal disease on hemodialysis Continue long-acting insulin with sliding scale coverage Maintain consistent carbohydrate/renal diet

## 2023-09-22 NOTE — Assessment & Plan Note (Signed)
>>  ASSESSMENT AND PLAN FOR ESRD (END STAGE RENAL DISEASE) (HCC) WRITTEN ON 09/22/2023  5:53 PM BY LANETTA LINGO, MD  Dialysis days are T/TH/S Nephrology consult for renal replacement therapy during this hospitalization

## 2023-09-22 NOTE — Assessment & Plan Note (Signed)
BMI 53.8 Complicates overall prognosis and care Lifestyle modification and exercise has been discussed with patient in detail

## 2023-09-22 NOTE — Assessment & Plan Note (Addendum)
Optimize blood pressure control Patient is on multiple antihypertensive medications which will be resumed during this hospitalization and include clonidine, carvedilol and amlodipine

## 2023-09-22 NOTE — Discharge Planning (Signed)
ESTABLISHED HEMODIALYSIS Outpatient Facility [facility]  [address] [place], [place] [address] [phone]  Schedule: TTS 6:30am  Dimas Chyle Dialysis Coordinator II  [employer] Cell: [phone] eFax: [phone] [email or web address]

## 2023-09-23 DIAGNOSIS — R7989 Other specified abnormal findings of blood chemistry: Secondary | ICD-10-CM

## 2023-09-23 DIAGNOSIS — N186 End stage renal disease: Secondary | ICD-10-CM

## 2023-09-23 DIAGNOSIS — I5033 Acute on chronic diastolic (congestive) heart failure: Secondary | ICD-10-CM | POA: Diagnosis not present

## 2023-09-23 LAB — HEPATITIS B SURFACE ANTIBODY, QUANTITATIVE: Hep B S AB Quant (Post): 16.9 m[IU]/mL

## 2023-09-23 LAB — CBG MONITORING, ED
Glucose-Capillary: 291 mg/dL — ABNORMAL HIGH (ref 70–99)
Glucose-Capillary: 357 mg/dL — ABNORMAL HIGH (ref 70–99)
Glucose-Capillary: 271 mg/dL — ABNORMAL HIGH (ref 70–99)

## 2023-09-23 MED ORDER — SEVELAMER CARBONATE 800 MG PO TABS
2400.0000 mg | ORAL_TABLET | Freq: Three times a day (TID) | ORAL | Status: DC
  Administered 2023-09-23: 2400 mg via ORAL
  Filled 2023-09-23 (×3): qty 3

## 2023-09-23 MED ORDER — CLONIDINE HCL 0.1 MG PO TABS
ORAL_TABLET | ORAL | Status: AC
  Filled 2023-09-23: qty 1

## 2023-09-23 MED ORDER — SUCROFERRIC OXYHYDROXIDE 500 MG PO CHEW
500.0000 mg | CHEWABLE_TABLET | Freq: Three times a day (TID) | ORAL | Status: DC
  Administered 2023-09-23: 500 mg via ORAL
  Filled 2023-09-23 (×3): qty 1

## 2023-09-23 MED ORDER — CLONIDINE HCL 0.1 MG PO TABS
ORAL_TABLET | ORAL | Status: AC
  Filled 2023-09-23: qty 2

## 2023-09-23 MED ORDER — SEVELAMER CARBONATE 800 MG PO TABS
2400.0000 mg | ORAL_TABLET | ORAL | Status: DC | PRN
Start: 2023-09-23 — End: 2023-09-23

## 2023-09-23 NOTE — ED Notes (Signed)
Family at bedside. Family member requested to know when Pt will go to dialysis. RN notified of request.

## 2023-09-23 NOTE — ED Notes (Signed)
Pt to Dialysis

## 2023-09-23 NOTE — Progress Notes (Signed)
[facility]  ROUNDING NOTE   Subjective:   Kelly Key is a 52 y.o. male with past medical history of COPD, diabetes, gout, HLP, Hypertension and end stage renal disease on hemodialysis. He presents to ED with shortness of breath. States he went to his dialysis treatment on Saturday but feels like they did not take enough fluid off.   He is known to our practice and receives outpatient dialysis treatments at [facility] on a TTS schedule.   Patient resting in bed Alert and oriented Denies pain or discomfort Awaiting dialysis treatment, may discharge afterwards.    Objective:  Vital signs in last 24 hours:  Temp:  [97.8 F (36.6 C)-98 F (36.7 C)] 97.8 F (36.6 C) (11/27 0746) Pulse Rate:  [77-88] 88 (11/27 0746) Resp:  [13-20] 16 (11/27 0746) BP: (141-182)/(82-137) 141/84 (11/27 0746) SpO2:  [94 %-99 %] 95 % (11/27 0746) Weight:  [151.3 kg] 151.3 kg (11/26 1445)  Weight change: 18.1 kg Filed Weights   09/22/23 0637 09/22/23 1101 09/22/23 1445  Weight: 136.1 kg (!) 154.2 kg (!) 151.3 kg    Intake/Output: I/O last 3 completed shifts: In: -  Out: 3225 [Urine:225; Other:3000]   Intake/Output this shift:  Total I/O In: -  Out: 300 [Urine:300]  Physical Exam: General: NAD  Head: Normocephalic, atraumatic. Moist oral mucosal membranes  Eyes: Anicteric  Lungs:  Clear to auscultation, tachypneic  Heart: Regular rate and rhythm  Abdomen:  Soft, nontender, obese  Extremities: Nonpitting peripheral edema.  Neurologic: Alert and oriented, moving all four extremities  Skin: No lesions  Access: Left upper aVF    Basic Metabolic Panel: Recent Labs  Lab 09/22/23 0649 09/22/23 2122  NA 137  --   K 3.9  --   CL 96*  --   CO2 24  --   GLUCOSE 144*  --   BUN 94*  --   CREATININE 11.44* 8.28*  CALCIUM 8.5*  --     Liver Function Tests: Recent Labs  Lab 09/22/23 0649  AST 14*  ALT 22  ALKPHOS 78  BILITOT 0.3  PROT 7.3  ALBUMIN 3.6   No  results for input(s): "LIPASE", "AMYLASE" in the last 168 hours. No results for input(s): "AMMONIA" in the last 168 hours.  CBC: Recent Labs  Lab 09/22/23 0649 09/22/23 2122  WBC 9.1 8.2  NEUTROABS 5.5  --   HGB 9.7* 10.1*  HCT 29.4* 30.8*  MCV 87.5 87.7  PLT 234 211    Cardiac Enzymes: No results for input(s): "CKTOTAL", "CKMB", "CKMBINDEX", "TROPONINI" in the last 168 hours.  BNP: Invalid input(s): "POCBNP"  CBG: Recent Labs  Lab 09/23/23 0250 09/23/23 0742  GLUCAP 291* 357*    Microbiology: Results for orders placed or performed during the hospital encounter of 09/14/23  Resp panel by RT-PCR (RSV, Flu A&B, Covid) Anterior Nasal Swab     Status: None   Collection Time: 09/14/23  7:37 AM   Specimen: Anterior Nasal Swab  Result Value Ref Range Status   SARS Coronavirus 2 by RT PCR NEGATIVE NEGATIVE Final    Comment: (NOTE) SARS-CoV-2 target nucleic acids are NOT DETECTED.  The SARS-CoV-2 RNA is generally detectable in upper respiratory specimens during the acute phase of infection. The lowest concentration of SARS-CoV-2 viral copies this assay can detect is 138 copies/mL. A negative result does not preclude SARS-Cov-2 infection and should not be used as the sole basis for treatment or other patient management decisions. A negative result may  occur with  improper specimen collection/handling, submission of specimen other than nasopharyngeal swab, presence of viral mutation(s) within the areas targeted by this assay, and inadequate number of viral copies(<138 copies/mL). A negative result must be combined with clinical observations, patient history, and epidemiological information. The expected result is Negative.  Fact Sheet for Patients:  BloggerCourse.com  Fact Sheet for Healthcare Providers:  SeriousBroker.it  This test is no t yet approved or cleared by the [place] FDA and  has been authorized for  detection and/or diagnosis of SARS-CoV-2 by FDA under an Emergency Use Authorization (EUA). This EUA will remain  in effect (meaning this test can be used) for the duration of the COVID-19 declaration under Section 564(b)(1) of the Act, 21 U.S.C.section 360bbb-3(b)(1), unless the authorization is terminated  or revoked sooner.       Influenza A by PCR NEGATIVE NEGATIVE Final   Influenza B by PCR NEGATIVE NEGATIVE Final    Comment: (NOTE) The Xpert Xpress SARS-CoV-2/FLU/RSV plus assay is intended as an aid in the diagnosis of influenza from Nasopharyngeal swab specimens and should not be used as a sole basis for treatment. Nasal washings and aspirates are unacceptable for Xpert Xpress SARS-CoV-2/FLU/RSV testing.  Fact Sheet for Patients: BloggerCourse.com  Fact Sheet for Healthcare Providers: SeriousBroker.it  This test is not yet approved or cleared by the [place] FDA and has been authorized for detection and/or diagnosis of SARS-CoV-2 by FDA under an Emergency Use Authorization (EUA). This EUA will remain in effect (meaning this test can be used) for the duration of the COVID-19 declaration under Section 564(b)(1) of the Act, 21 U.S.C. section 360bbb-3(b)(1), unless the authorization is terminated or revoked.     Resp Syncytial Virus by PCR NEGATIVE NEGATIVE Final    Comment: (NOTE) Fact Sheet for Patients: BloggerCourse.com  Fact Sheet for Healthcare Providers: SeriousBroker.it  This test is not yet approved or cleared by the [place] FDA and has been authorized for detection and/or diagnosis of SARS-CoV-2 by FDA under an Emergency Use Authorization (EUA). This EUA will remain in effect (meaning this test can be used) for the duration of the COVID-19 declaration under Section 564(b)(1) of the Act, 21 U.S.C. section 360bbb-3(b)(1), unless the authorization is  terminated or revoked.  Performed at [facility], [address], [place], [place] [address]     Coagulation Studies: No results for input(s): "LABPROT", "INR" in the last 72 hours.  Urinalysis: No results for input(s): "COLORURINE", "LABSPEC", "PHURINE", "GLUCOSEU", "HGBUR", "BILIRUBINUR", "KETONESUR", "PROTEINUR", "UROBILINOGEN", "NITRITE", "LEUKOCYTESUR" in the last 72 hours.  Invalid input(s): "APPERANCEUR"    Imaging: DG Chest Portable 1 View  Result Date: 09/22/2023 CLINICAL DATA:  Volume overload.  Shortness of breath. EXAM: PORTABLE CHEST 1 VIEW COMPARISON:  Chest radiograph dated 09/22/2023 FINDINGS: With there is cardiomegaly with mild central vascular congestion. No focal consolidation, pleural effusion or pneumothorax. No acute osseous pathology. IMPRESSION: Cardiomegaly with mild central vascular congestion. Electronically Signed   By: Elgie Collard M.D.   On: 09/22/2023 19:29   DG Chest Port 1 View  Result Date: 09/22/2023 CLINICAL DATA:  Shortness of breath EXAM: PORTABLE CHEST 1 VIEW COMPARISON:  09/14/2023 FINDINGS: Cardiac enlargement. Aortic and hilar contours are stable. There is no edema, consolidation, effusion, or pneumothorax. IMPRESSION: No acute finding when compared to prior. Electronically Signed   By: Tiburcio Pea M.D.   On: 09/22/2023 07:01     Medications:    sodium chloride     sodium chloride  allopurinol  200 mg Oral Daily   amLODipine  5 mg Oral Daily   aspirin EC  81 mg Oral Daily   atorvastatin  80 mg Oral Daily   carvedilol  25 mg Oral BID WC   Chlorhexidine Gluconate Cloth  6 each Topical Q0600   cloNIDine  0.3 mg Oral TID   clopidogrel  75 mg Oral Daily   ezetimibe  10 mg Oral QPM   furosemide  80 mg Oral Daily   gabapentin  100 mg Oral TID   heparin  5,000 Units Subcutaneous Q8H   insulin aspart  0-6 Units Subcutaneous TID WC   insulin detemir  50 Units Subcutaneous QHS   linaclotide  145 mcg Oral QAC breakfast    multivitamin  1 tablet Oral QHS   pantoprazole  40 mg Oral Daily   sevelamer carbonate  2,400 mg Oral TID WC   sodium chloride flush  3 mL Intravenous Q12H   sucroferric oxyhydroxide  500 mg Oral TID WC   sodium chloride, sodium chloride, acetaminophen, albuterol, fluticasone, ondansetron **OR** ondansetron (ZOFRAN) IV, sevelamer carbonate **AND** sevelamer carbonate, sodium chloride flush  Assessment/ Plan:  Kelly Key is a 52 y.o.  male with past medical history of COPD, diabetes, gout, HLP, Hypertension and end stage renal disease on hemodialysis. He presents to ED with shortness of breath. States he went to his dialysis treatment on Saturday but feels like they did not take enough fluid off.   [facility]/TTS/ Lt AVF  Acute respiratory distress likely due to fluid volume overload. Chest xray shows cardiac enlargement.  Respiratory status has improved, patient remains on room air.  Will provide UF only treatment today for additional fluid removal.  2. End stage renal disease on hemodialysis.  Patient received dialysis yesterday, UF 3 L achieved.  Patient will receive UF only treatment today with desired goal of 3 to 3.5 L.  3. Anemia of chronic kidney disease Lab Results  Component Value Date   HGB 10.1 (L) 09/22/2023    Patient receives Mircera at outpatient clinic.  Hgb remains within desired range  4. Secondary Hyperparathyroidism:   Lab Results  Component Value Date   CALCIUM 8.5 (L) 09/22/2023   CAION 1.10 (L) 11/11/2019   PHOS 5.4 (H) 05/06/2023    Bone minerals acceptable.   LOS: 0 Lamario Mani 11/27/202412:33 PM

## 2023-09-23 NOTE — ED Notes (Signed)
Family at bedside. 

## 2023-09-23 NOTE — Progress Notes (Signed)
Received patient in bed to unit.    Informed consent signed and in chart.    TX duration: 3     Transported back to floor  Hand-off given to patient's nurse. Roylene Reason, RN   Access used:  lt AVF Access issues: n/a  Total UF removed: 2600 mls Medication(s) given: Clonidine 0.3 mg po Post HD weight:  149.6kg     Maple Hudson, RN Dialysis Unit

## 2023-09-23 NOTE — ED Notes (Signed)
Family at bedside. Family member is requesting to know when pt will be going to dialysis. RN notified.

## 2023-09-23 NOTE — Discharge Summary (Signed)
Physician Discharge Summary   Patient: Kelly Key MRN: [id number] DOB: [birth year]  Admit date:     09/22/2023  Discharge date: 09/23/23  Discharge Physician: Marrion Coy   PCP: Pearson Grippe, MD   Recommendations at discharge:   Follow-up with PCP in 1 week.  Discharge Diagnoses: Principal Problem:   Acute on chronic diastolic CHF (congestive heart failure) (HCC) Active Problems:   Hypertensive urgency   Elevated troponin   ESRD (end stage renal disease) (HCC)   PAD (peripheral artery disease) (HCC)   Morbid obesity (HCC)   Uncontrolled type 2 diabetes mellitus with hyperglycemia (HCC)   OSA (obstructive sleep apnea) First degree A-V block. Resolved Problems:   * No resolved hospital problems. *  Hospital Course: Kelly Key is a 52 y.o. male with medical history significant for chronic diastolic dysfunction CHF with last known LVEF of 60 to 65% from a 2D echocardiogram which was done 03/24, history of diabetes mellitus with complications of end-stage renal disease on hemodialysis (T/TH/S), hypertension, morbid obesity with a BMI of 53 who presents to the emergency room for evaluation of sudden onset shortness of breath.  Patient is seen by nephrology, apparently, he has been drinking excess amount of water, he had a 7 kg of weight gain in 7 days.  Upon arriving the hospital, he was emergent dialyzed yesterday with removing of 3.5 L of fluids, discussed with nephrology, he is dialyzed again today to remove another 3.5 L of fluid.  At this point, condition has improved.  Medically stable for discharge.  Assessment and Plan: * Acute on chronic diastolic CHF (congestive heart failure) (HCC) 2D echocardiogram from 03/24 shows an LVEF of 60 to 65% with grade 2 diastolic dysfunction. Patient has end-stage renal disease, has not been compliant with fluid restriction.  Conditions are caused by volume overload.  Condition had improved after dialysis.  Medically stable for  discharge.  Elevated troponin Troponin is flat Secondary to demand ischemia from hypertensive urgency No additional workup.  Hypertensive urgency Secondary to volume overload.  Continue home medicines.  PAD (peripheral artery disease) (HCC) Continue carvedilol, aspirin, Plavix and atorvastatin  ESRD (end stage renal disease) (HCC) He has been dialyzed x 2 days, he is going back to regular dialysis tomorrow.  Morbid obesity (HCC) BMI 53.8 Complicates overall prognosis and care Lifestyle modification and exercise has been discussed with patient in detail  Uncontrolled type 2 diabetes with hyperglycemia Resume home dose insulin.        Consultants: Nephrology Procedures performed: HD  Disposition: Home Diet recommendation:  Discharge Diet Orders (From admission, onward)     Start     Ordered   09/23/23 0000  Diet general       Comments: Renal diet, fluid 1269ml/day   09/23/23 1317           Renal diet DISCHARGE MEDICATION: Allergies as of 09/23/2023       Reactions   Ozempic (0.25 Or 0.5 Mg-dose) [semaglutide(0.25 Or 0.5mg -dos)] Shortness Of Breath   Hydralazine Anxiety   Hallucinations    Lisinopril Swelling        Medication List     TAKE these medications    Accu-Chek Aviva Plus test strip Generic drug: glucose blood Use as instructed TO CHECK BLOOD GLUCOSE THREE TIMES DAILY   acetaminophen 500 MG tablet Commonly known as: TYLENOL Take 1,000 mg by mouth every 6 (six) hours as needed for moderate pain or headache.   albuterol 108 (90 Base) MCG/ACT inhaler Commonly  known as: VENTOLIN HFA Inhale 2 puffs into the lungs every 4 (four) hours as needed for wheezing or shortness of breath.   allopurinol 100 MG tablet Commonly known as: ZYLOPRIM Take 2 tablets (200 mg total) by mouth daily.   AMBULATORY NON FORMULARY MEDICATION Inject 0.2 ml by intracavernosal route as directed.  Medication Name: TriMix PGE 30 mcg Pap   30 mg Phent 1 mg    amLODipine 5 MG tablet Commonly known as: NORVASC Take 5 mg by mouth daily.   aspirin EC 81 MG tablet Take 1 tablet (81 mg total) by mouth daily.   atorvastatin 80 MG tablet Commonly known as: LIPITOR Take 1 tablet (80 mg total) by mouth daily.   B-D ULTRAFINE III SHORT PEN 31G X 8 MM Misc Generic drug: Insulin Pen Needle 1 each by Does not apply route as directed.   carvedilol 25 MG tablet Commonly known as: COREG Take 1 tablet (25 mg total) by mouth 2 (two) times daily with a meal.   cloNIDine 0.3 MG tablet Commonly known as: CATAPRES Take 1 tablet (0.3 mg total) by mouth 3 (three) times daily.   clopidogrel 75 MG tablet Commonly known as: PLAVIX Take 75 mg by mouth daily.   esomeprazole 20 MG capsule Commonly known as: NEXIUM Take 20 mg by mouth daily.   ezetimibe 10 MG tablet Commonly known as: ZETIA Take 10 mg by mouth every evening.   fluticasone 50 MCG/ACT nasal spray Commonly known as: FLONASE Place 1 spray into both nostrils daily as needed for allergies or rhinitis.   furosemide 80 MG tablet Commonly known as: LASIX Take 80 mg by mouth daily.   gabapentin 100 MG capsule Commonly known as: NEURONTIN Take 1 capsule (100 mg total) by mouth 3 (three) times daily.   HumaLOG KwikPen 100 UNIT/ML KwikPen Generic drug: insulin lispro Inject 8-30 Units into the skin 3 (three) times daily. Sliding scale 30 units max TID   Ipratropium-Albuterol 20-100 MCG/ACT Aers respimat Commonly known as: COMBIVENT Inhale 1 puff into the lungs every 6 (six) hours as needed for wheezing or shortness of breath.   Levemir FlexTouch 100 UNIT/ML FlexTouch Pen Generic drug: insulin detemir Inject 50 Units into the skin at bedtime.   Linzess 145 MCG Caps capsule Generic drug: linaclotide Take 145 mcg by mouth daily before breakfast.   multivitamin Tabs tablet Take 1 tablet by mouth at bedtime.   sevelamer carbonate 800 MG tablet Commonly known as: RENVELA Take 3 tablets  (2,400 mg total) by mouth 3 (three) times daily with meals. What changed: additional instructions   Velphoro 500 MG chewable tablet Generic drug: sucroferric oxyhydroxide Chew 500 mg by mouth 3 (three) times daily.               Discharge Care Instructions  (From admission, onward)           Start     Ordered   09/23/23 0000  Discharge wound care:       Comments: Follow with HD   09/23/23 1317            Follow-up Information     Pearson Grippe, MD Follow up in 1 week(s).   Specialty: Internal Medicine Contact information: [address] [place] [address] [phone]                Discharge Exam: Ceasar Mons Weights   09/22/23 1101 09/22/23 1445 09/23/23 1244  Weight: (!) 154.2 kg (!) 151.3 kg (!) 152  kg   General exam: Appears calm and comfortable, morbid obese. Respiratory system: Clear to auscultation. Respiratory effort normal. Cardiovascular system: S1 & S2 heard, RRR. No JVD, murmurs, rubs, gallops or clicks. No pedal edema. Gastrointestinal system: Abdomen is nondistended, soft and nontender. No organomegaly or masses felt. Normal bowel sounds heard. Central nervous system: Alert and oriented. No focal neurological deficits. Extremities: Symmetric 5 x 5 power. Skin: No rashes, lesions or ulcers Psychiatry: Judgement and insight appear normal. Mood & affect appropriate.    Condition at discharge: good  The results of significant diagnostics from this hospitalization (including imaging, microbiology, ancillary and laboratory) are listed below for reference.   Imaging Studies: DG Chest Portable 1 View  Result Date: 09/22/2023 CLINICAL DATA:  Volume overload.  Shortness of breath. EXAM: PORTABLE CHEST 1 VIEW COMPARISON:  Chest radiograph dated 09/22/2023 FINDINGS: With there is cardiomegaly with mild central vascular congestion. No focal consolidation, pleural effusion or pneumothorax. No acute osseous pathology. IMPRESSION:  Cardiomegaly with mild central vascular congestion. Electronically Signed   By: Elgie Collard M.D.   On: 09/22/2023 19:29   DG Chest Port 1 View  Result Date: 09/22/2023 CLINICAL DATA:  Shortness of breath EXAM: PORTABLE CHEST 1 VIEW COMPARISON:  09/14/2023 FINDINGS: Cardiac enlargement. Aortic and hilar contours are stable. There is no edema, consolidation, effusion, or pneumothorax. IMPRESSION: No acute finding when compared to prior. Electronically Signed   By: Tiburcio Pea M.D.   On: 09/22/2023 07:01   OCT, Retina - OU - Both Eyes  Result Date: 09/21/2023 Right Eye Quality was good. Central Foveal Thickness: 266. Progression has improved. Findings include no SRF, abnormal foveal contour, intraretinal hyper-reflective material, intraretinal fluid, vitreomacular adhesion (Stable improvement in central IRF inferior macula and fovea, stable improvement in SRF, scattered punctate IRHM - improved). Left Eye Central Foveal Thickness: 284. Progression has been stable. Findings include no SRF, abnormal foveal contour, intraretinal hyper-reflective material, vitreomacular adhesion ( persistent IRF/cystic changes IT fovea and macula). Notes *Images captured and stored on drive Diagnosis / Impression: +DME OU (OD > OS) OD: Stable improvement in central IRF inferior macula and fovea, stable improvement in SRF, scattered punctate IRHM -- improved OS: persistent IRF/cystic changes IT fovea and macula Clinical management: See below Abbreviations: NFP - Normal foveal profile. CME - cystoid macular edema. PED - pigment epithelial detachment. IRF - intraretinal fluid. SRF - subretinal fluid. EZ - ellipsoid zone. ERM - epiretinal membrane. ORA - outer retinal atrophy. ORT - outer retinal tubulation. SRHM - subretinal hyper-reflective material. IRHM - intraretinal hyper-reflective material   DG Chest Portable 1 View  Result Date: 09/14/2023 CLINICAL DATA:  Shortness of breath since 1 a.m. EXAM: PORTABLE CHEST 1  VIEW COMPARISON:  06/08/2023 FINDINGS: The heart size and mediastinal contours are within normal limits. Both lungs are clear. The visualized skeletal structures are unremarkable. IMPRESSION: No active disease. Electronically Signed   By: Signa Kell M.D.   On: 09/14/2023 06:51   VAS [place] LOWER EXTREMITY ARTERIAL DUPLEX  Result Date: 09/02/2023 LOWER EXTREMITY ARTERIAL DUPLEX STUDY Patient Name:  ARON WOLFFE  Date of Exam:   09/02/2023 Medical Rec #: [id number]     Accession #:    [id number] Date of Birth: Aug 08, 1971      Patient Gender: M Patient Age:   65 years Exam Location:  [facility] Procedure:      VAS [place] LOWER EXTREMITY ARTERIAL DUPLEX Referring Phys: Sherald Hess --------------------------------------------------------------------------------  Indications: Peripheral artery disease. High Risk Factors: Hypertension,  hyperlipidemia, Diabetes.  Vascular Interventions: 12/01/2022- Angioplasty and stent of left SFA (7 mm x 120                         mm drug-coated Eluvia postdilated with a 6 mm Mustang).                         Left anterior tibial artery angioplasty (3 mm x 220 mm                         [place]). Current ABI:            right=1.07/0.92, left=1.22/0.87 Comparison Study: 01/06/23 Left lower extremity arterial duplex- Left: Patent                   stent with no evidence of stenosis in the superficial femoral                   artery. Performing Technologist: Gertie Fey MHA, RDMS, RVT, RDCS  Examination Guidelines: A complete evaluation includes B-mode imaging, spectral Doppler, color Doppler, and power Doppler as needed of all accessible portions of each vessel. Bilateral testing is considered an integral part of a complete examination. Limited examinations for reoccurring indications may be performed as noted.   +-----------+--------+-----+--------+---------+------------------+ LEFT       PSV cm/sRatioStenosisWaveform Comments            +-----------+--------+-----+--------+---------+------------------+ CFA Distal 108                  triphasic                   +-----------+--------+-----+--------+---------+------------------+ DFA        71                   triphasic                   +-----------+--------+-----+--------+---------+------------------+ SFA Prox   102                  triphasic                   +-----------+--------+-----+--------+---------+------------------+ POP Prox   53                   biphasic                    +-----------+--------+-----+--------+---------+------------------+ POP Distal 52                   biphasic                    +-----------+--------+-----+--------+---------+------------------+ TP Trunk   54                   triphasic                   +-----------+--------+-----+--------+---------+------------------+ ATA Distal 67                   biphasic                    +-----------+--------+-----+--------+---------+------------------+ PTA Prox   93                   biphasic                    +-----------+--------+-----+--------+---------+------------------+  PTA Mid    97                   biphasic                    +-----------+--------+-----+--------+---------+------------------+ PTA Distal 89                   biphasic                    +-----------+--------+-----+--------+---------+------------------+ PERO Prox                                Unable to insonate +-----------+--------+-----+--------+---------+------------------+ PERO Mid                                 Unable to insonate +-----------+--------+-----+--------+---------+------------------+ PERO Distal                              Unable to insonate +-----------+--------+-----+--------+---------+------------------+  Left Stent(s): +---------------+--------+--------+---------+--------+ SFA            PSV cm/sStenosisWaveform Comments  +---------------+--------+--------+---------+--------+ Prox to Stent  103             triphasic         +---------------+--------+--------+---------+--------+ Proximal Stent 99              triphasic         +---------------+--------+--------+---------+--------+ Mid Stent      120             triphasic         +---------------+--------+--------+---------+--------+ Distal Stent   109             triphasic         +---------------+--------+--------+---------+--------+ Distal to Stent102             triphasic         +---------------+--------+--------+---------+--------+    Summary: Left: Patent stent with no evidence of stenosis in the superficial femoral artery.  See table(s) above for measurements and observations. Electronically signed by Lemar Livings MD on 09/02/2023 at 4:27:56 PM.    Final    VAS [place] ABI WITH/WO TBI  Result Date: 09/02/2023  LOWER EXTREMITY DOPPLER STUDY Patient Name:  SHRAVAN GAUTREAU  Date of Exam:   09/02/2023 Medical Rec #: [id number]     Accession #:    [id number] Date of Birth: 1971-08-11      Patient Gender: M Patient Age:   67 years Exam Location:  [facility] Procedure:      VAS [place] ABI WITH/WO TBI Referring Phys: Sherald Hess --------------------------------------------------------------------------------  Indications: Peripheral artery disease. High Risk Factors: Hypertension, hyperlipidemia, Diabetes.  Vascular Interventions: 12/01/2022- Angioplasty and stent of left SFA (7 mm x 120                         mm drug-coated Eluvia postdilated with a 6 mm Mustang).                         Left anterior tibial artery angioplasty (3 mm x 220 mm                         [place]). Comparison Study: 01/06/23 ABI/TBI- right=1.05/0.70,  left=0.98/0.66 Performing Technologist: Gertie Fey MHA, RVT, RDCS, RDMS  Examination Guidelines: A complete evaluation includes at minimum, Doppler waveform signals and systolic blood pressure reading at the  level of bilateral brachial, anterior tibial, and posterior tibial arteries, when vessel segments are accessible. Bilateral testing is considered an integral part of a complete examination. Photoelectric Plethysmograph (PPG) waveforms and toe systolic pressure readings are included as required and additional duplex testing as needed. Limited examinations for reoccurring indications may be performed as noted.  ABI Findings: +---------+------------------+-----+---------+--------+ Right    Rt Pressure (mmHg)IndexWaveform Comment  +---------+------------------+-----+---------+--------+ Brachial 170                                      +---------+------------------+-----+---------+--------+ ATA      163               0.96 triphasic         +---------+------------------+-----+---------+--------+ PTA      182               1.07 triphasic         +---------+------------------+-----+---------+--------+ Great Toe156               0.92                   +---------+------------------+-----+---------+--------+ +---------+------------------+-----+---------+--------------------+ Left     Lt Pressure (mmHg)IndexWaveform Comment              +---------+------------------+-----+---------+--------------------+ Brachial                                 Restricted extremity +---------+------------------+-----+---------+--------------------+ ATA      163               0.96 biphasic                      +---------+------------------+-----+---------+--------------------+ PTA      208               1.22 triphasic                     +---------+------------------+-----+---------+--------------------+ Great Toe148               0.87                               +---------+------------------+-----+---------+--------------------+ +-------+-----------+-----------+------------+------------+ ABI/TBIToday's ABIToday's TBIPrevious ABIPrevious TBI  +-------+-----------+-----------+------------+------------+ Right  1.07       0.92       1.05        0.70         +-------+-----------+-----------+------------+------------+ Left   1.22       0.87       0.98        0.66         +-------+-----------+-----------+------------+------------+  Bilateral ABIs and TBIs appear increased compared to prior study on 01/06/23.  Summary: Right: Resting right ankle-brachial index is within normal range. The right toe-brachial index is normal. Left: Resting left ankle-brachial index is within normal range. The left toe-brachial index is normal. *See table(s) above for measurements and observations.  Electronically signed by Lemar Livings MD on 09/02/2023 at 3:25:03 PM.    Final     Microbiology: Results for orders placed or performed during the hospital encounter of 09/14/23  Resp panel  by RT-PCR (RSV, Flu A&B, Covid) Anterior Nasal Swab     Status: None   Collection Time: 09/14/23  7:37 AM   Specimen: Anterior Nasal Swab  Result Value Ref Range Status   SARS Coronavirus 2 by RT PCR NEGATIVE NEGATIVE Final    Comment: (NOTE) SARS-CoV-2 target nucleic acids are NOT DETECTED.  The SARS-CoV-2 RNA is generally detectable in upper respiratory specimens during the acute phase of infection. The lowest concentration of SARS-CoV-2 viral copies this assay can detect is 138 copies/mL. A negative result does not preclude SARS-Cov-2 infection and should not be used as the sole basis for treatment or other patient management decisions. A negative result may occur with  improper specimen collection/handling, submission of specimen other than nasopharyngeal swab, presence of viral mutation(s) within the areas targeted by this assay, and inadequate number of viral copies(<138 copies/mL). A negative result must be combined with clinical observations, patient history, and epidemiological information. The expected result is Negative.  Fact Sheet for Patients:   BloggerCourse.com  Fact Sheet for Healthcare Providers:  SeriousBroker.it  This test is no t yet approved or cleared by the [place] FDA and  has been authorized for detection and/or diagnosis of SARS-CoV-2 by FDA under an Emergency Use Authorization (EUA). This EUA will remain  in effect (meaning this test can be used) for the duration of the COVID-19 declaration under Section 564(b)(1) of the Act, 21 U.S.C.section 360bbb-3(b)(1), unless the authorization is terminated  or revoked sooner.       Influenza A by PCR NEGATIVE NEGATIVE Final   Influenza B by PCR NEGATIVE NEGATIVE Final    Comment: (NOTE) The Xpert Xpress SARS-CoV-2/FLU/RSV plus assay is intended as an aid in the diagnosis of influenza from Nasopharyngeal swab specimens and should not be used as a sole basis for treatment. Nasal washings and aspirates are unacceptable for Xpert Xpress SARS-CoV-2/FLU/RSV testing.  Fact Sheet for Patients: BloggerCourse.com  Fact Sheet for Healthcare Providers: SeriousBroker.it  This test is not yet approved or cleared by the [place] FDA and has been authorized for detection and/or diagnosis of SARS-CoV-2 by FDA under an Emergency Use Authorization (EUA). This EUA will remain in effect (meaning this test can be used) for the duration of the COVID-19 declaration under Section 564(b)(1) of the Act, 21 U.S.C. section 360bbb-3(b)(1), unless the authorization is terminated or revoked.     Resp Syncytial Virus by PCR NEGATIVE NEGATIVE Final    Comment: (NOTE) Fact Sheet for Patients: BloggerCourse.com  Fact Sheet for Healthcare Providers: SeriousBroker.it  This test is not yet approved or cleared by the [place] FDA and has been authorized for detection and/or diagnosis of SARS-CoV-2 by FDA under an Emergency Use  Authorization (EUA). This EUA will remain in effect (meaning this test can be used) for the duration of the COVID-19 declaration under Section 564(b)(1) of the Act, 21 U.S.C. section 360bbb-3(b)(1), unless the authorization is terminated or revoked.  Performed at [facility], [address] [place], [place] [address]     Labs: CBC: Recent Labs  Lab 09/22/23 0649 09/22/23 2122  WBC 9.1 8.2  NEUTROABS 5.5  --   HGB 9.7* 10.1*  HCT 29.4* 30.8*  MCV 87.5 87.7  PLT 234 211   Basic Metabolic Panel: Recent Labs  Lab 09/22/23 0649 09/22/23 2122  NA 137  --   K 3.9  --   CL 96*  --   CO2 24  --   GLUCOSE 144*  --   BUN  94*  --   CREATININE 11.44* 8.28*  CALCIUM 8.5*  --    Liver Function Tests: Recent Labs  Lab 09/22/23 0649  AST 14*  ALT 22  ALKPHOS 78  BILITOT 0.3  PROT 7.3  ALBUMIN 3.6   CBG: Recent Labs  Lab 09/23/23 0250 09/23/23 0742  GLUCAP 291* 357*    Discharge time spent: greater than 30 minutes.  Signed: Marrion Coy, MD [facility] 09/23/2023

## 2023-09-29 ENCOUNTER — Emergency Department: Payer: 59

## 2023-09-29 ENCOUNTER — Inpatient Hospital Stay: Payer: 59

## 2023-09-29 ENCOUNTER — Encounter: Payer: Self-pay | Admitting: Emergency Medicine

## 2023-09-29 ENCOUNTER — Observation Stay
Admission: EM | Admit: 2023-09-29 | Discharge: 2023-09-30 | Disposition: A | Discharge status: Home / Self Care | Payer: 59 | Attending: Student | Admitting: Student

## 2023-09-29 ENCOUNTER — Other Ambulatory Visit: Payer: Self-pay

## 2023-09-29 DIAGNOSIS — R0989 Other specified symptoms and signs involving the circulatory and respiratory systems: Secondary | ICD-10-CM | POA: Diagnosis not present

## 2023-09-29 DIAGNOSIS — R0789 Other chest pain: Secondary | ICD-10-CM | POA: Insufficient documentation

## 2023-09-29 DIAGNOSIS — I509 Heart failure, unspecified: Secondary | ICD-10-CM

## 2023-09-29 DIAGNOSIS — I132 Hypertensive heart and chronic kidney disease with heart failure and with stage 5 chronic kidney disease, or end stage renal disease: Secondary | ICD-10-CM | POA: Diagnosis not present

## 2023-09-29 DIAGNOSIS — E8770 Fluid overload, unspecified: Secondary | ICD-10-CM | POA: Diagnosis not present

## 2023-09-29 DIAGNOSIS — Z794 Long term (current) use of insulin: Secondary | ICD-10-CM | POA: Diagnosis not present

## 2023-09-29 DIAGNOSIS — I5033 Acute on chronic diastolic (congestive) heart failure: Secondary | ICD-10-CM | POA: Insufficient documentation

## 2023-09-29 DIAGNOSIS — R0602 Shortness of breath: Secondary | ICD-10-CM | POA: Diagnosis present

## 2023-09-29 DIAGNOSIS — E1122 Type 2 diabetes mellitus with diabetic chronic kidney disease: Secondary | ICD-10-CM | POA: Insufficient documentation

## 2023-09-29 DIAGNOSIS — N186 End stage renal disease: Secondary | ICD-10-CM | POA: Diagnosis present

## 2023-09-29 DIAGNOSIS — D509 Iron deficiency anemia, unspecified: Secondary | ICD-10-CM | POA: Insufficient documentation

## 2023-09-29 DIAGNOSIS — J9601 Acute respiratory failure with hypoxia: Secondary | ICD-10-CM

## 2023-09-29 DIAGNOSIS — R079 Chest pain, unspecified: Secondary | ICD-10-CM

## 2023-09-29 DIAGNOSIS — Z7902 Long term (current) use of antithrombotics/antiplatelets: Secondary | ICD-10-CM | POA: Diagnosis not present

## 2023-09-29 DIAGNOSIS — Z79899 Other long term (current) drug therapy: Secondary | ICD-10-CM | POA: Diagnosis not present

## 2023-09-29 DIAGNOSIS — E662 Morbid (severe) obesity with alveolar hypoventilation: Secondary | ICD-10-CM | POA: Insufficient documentation

## 2023-09-29 DIAGNOSIS — E785 Hyperlipidemia, unspecified: Secondary | ICD-10-CM | POA: Diagnosis not present

## 2023-09-29 DIAGNOSIS — Z6841 Body Mass Index (BMI) 40.0 and over, adult: Secondary | ICD-10-CM | POA: Diagnosis not present

## 2023-09-29 DIAGNOSIS — Z7982 Long term (current) use of aspirin: Secondary | ICD-10-CM | POA: Diagnosis not present

## 2023-09-29 DIAGNOSIS — J441 Chronic obstructive pulmonary disease with (acute) exacerbation: Secondary | ICD-10-CM | POA: Diagnosis not present

## 2023-09-29 DIAGNOSIS — E88819 Insulin resistance, unspecified: Secondary | ICD-10-CM | POA: Insufficient documentation

## 2023-09-29 DIAGNOSIS — Z992 Dependence on renal dialysis: Secondary | ICD-10-CM | POA: Insufficient documentation

## 2023-09-29 LAB — CBC WITH DIFFERENTIAL/PLATELET
Abs Immature Granulocytes: 0.03 10*3/uL (ref 0.00–0.07)
Basophils Absolute: 0.1 10*3/uL (ref 0.0–0.1)
Basophils Relative: 1 %
Eosinophils Absolute: 1.2 10*3/uL — ABNORMAL HIGH (ref 0.0–0.5)
Eosinophils Relative: 11 %
HCT: 31.1 % — ABNORMAL LOW (ref 39.0–52.0)
Hemoglobin: 9.8 g/dL — ABNORMAL LOW (ref 13.0–17.0)
Immature Granulocytes: 0 %
Lymphocytes Relative: 16 %
Lymphs Abs: 1.7 10*3/uL (ref 0.7–4.0)
MCH: 28.7 pg (ref 26.0–34.0)
MCHC: 31.5 g/dL (ref 30.0–36.0)
MCV: 90.9 fL (ref 80.0–100.0)
Monocytes Absolute: 0.6 10*3/uL (ref 0.1–1.0)
Monocytes Relative: 6 %
Neutro Abs: 7.1 10*3/uL (ref 1.7–7.7)
Neutrophils Relative %: 66 %
Platelets: 316 10*3/uL (ref 150–400)
RBC: 3.42 MIL/uL — ABNORMAL LOW (ref 4.22–5.81)
RDW: 15.8 % — ABNORMAL HIGH (ref 11.5–15.5)
WBC: 10.7 10*3/uL — ABNORMAL HIGH (ref 4.0–10.5)
nRBC: 0 % (ref 0.0–0.2)

## 2023-09-29 LAB — COMPREHENSIVE METABOLIC PANEL
ALT: 32 U/L (ref 0–44)
AST: 21 U/L (ref 15–41)
Albumin: 3.8 g/dL (ref 3.5–5.0)
Alkaline Phosphatase: 70 U/L (ref 38–126)
Anion gap: 14 (ref 5–15)
BUN: 79 mg/dL — ABNORMAL HIGH (ref 6–20)
CO2: 26 mmol/L (ref 22–32)
Calcium: 8.1 mg/dL — ABNORMAL LOW (ref 8.9–10.3)
Chloride: 95 mmol/L — ABNORMAL LOW (ref 98–111)
Creatinine, Ser: 11.57 mg/dL — ABNORMAL HIGH (ref 0.61–1.24)
GFR, Estimated: 5 mL/min — ABNORMAL LOW (ref >60)
Glucose, Bld: 117 mg/dL — ABNORMAL HIGH (ref 70–99)
Potassium: 4.4 mmol/L (ref 3.5–5.1)
Sodium: 135 mmol/L (ref 135–145)
Total Bilirubin: 0.6 mg/dL (ref <1.2)
Total Protein: 7.8 g/dL (ref 6.5–8.1)

## 2023-09-29 LAB — BRAIN NATRIURETIC PEPTIDE: B Natriuretic Peptide: 631.4 pg/mL — ABNORMAL HIGH (ref 0.0–100.0)

## 2023-09-29 LAB — CBG MONITORING, ED
Glucose-Capillary: 258 mg/dL — ABNORMAL HIGH (ref 70–99)
Glucose-Capillary: 446 mg/dL — ABNORMAL HIGH (ref 70–99)

## 2023-09-29 LAB — TROPONIN I (HIGH SENSITIVITY)
Troponin I (High Sensitivity): 33 ng/L — ABNORMAL HIGH (ref <18)
Troponin I (High Sensitivity): 30 ng/L — ABNORMAL HIGH (ref <18)

## 2023-09-29 LAB — D-DIMER, QUANTITATIVE: D-Dimer, Quant: 0.86 ug{FEU}/mL — ABNORMAL HIGH (ref 0.00–0.50)

## 2023-09-29 LAB — GLUCOSE, CAPILLARY
Glucose-Capillary: 465 mg/dL — ABNORMAL HIGH (ref 70–99)
Glucose-Capillary: 409 mg/dL — ABNORMAL HIGH (ref 70–99)

## 2023-09-29 MED ORDER — IOHEXOL 350 MG/ML SOLN
100.0000 mL | Freq: Once | INTRAVENOUS | Status: AC | PRN
  Administered 2023-09-29: 100 mL via INTRAVENOUS

## 2023-09-29 MED ORDER — SEVELAMER CARBONATE 800 MG PO TABS
2400.0000 mg | ORAL_TABLET | Freq: Three times a day (TID) | ORAL | Status: DC
  Administered 2023-09-30: 2400 mg via ORAL
  Filled 2023-09-29 (×3): qty 3

## 2023-09-29 MED ORDER — SODIUM CHLORIDE 0.9% FLUSH: 3.0000 mL | INTRAVENOUS | Status: DC | PRN

## 2023-09-29 MED ORDER — CHLORHEXIDINE GLUCONATE CLOTH 2 % EX PADS
6.0000 | MEDICATED_PAD | Freq: Every day | CUTANEOUS | Status: DC
  Administered 2023-09-30: 6 via TOPICAL
  Filled 2023-09-29: qty 6

## 2023-09-29 MED ORDER — IPRATROPIUM-ALBUTEROL 0.5-2.5 (3) MG/3ML IN SOLN: 3.0000 mL | Freq: Four times a day (QID) | RESPIRATORY_TRACT | Status: DC | PRN

## 2023-09-29 MED ORDER — SUCROFERRIC OXYHYDROXIDE 500 MG PO CHEW
500.0000 mg | CHEWABLE_TABLET | Freq: Three times a day (TID) | ORAL | Status: DC
  Administered 2023-09-29: 500 mg via ORAL
  Filled 2023-09-29 (×6): qty 1

## 2023-09-29 MED ORDER — PENTAFLUOROPROP-TETRAFLUOROETH EX AERO
1.0000 | INHALATION_SPRAY | CUTANEOUS | Status: DC | PRN
Start: 2023-09-29 — End: 2023-09-29

## 2023-09-29 MED ORDER — CARVEDILOL 25 MG PO TABS
25.0000 mg | ORAL_TABLET | Freq: Two times a day (BID) | ORAL | Status: DC
  Administered 2023-09-29: 25 mg via ORAL
  Filled 2023-09-29: qty 4

## 2023-09-29 MED ORDER — ALLOPURINOL 100 MG PO TABS
200.0000 mg | ORAL_TABLET | Freq: Every day | ORAL | Status: DC
  Administered 2023-09-30: 200 mg via ORAL
  Filled 2023-09-29 (×2): qty 2

## 2023-09-29 MED ORDER — FERROUS SULFATE 325 (65 FE) MG PO TABS
325.0000 mg | ORAL_TABLET | Freq: Two times a day (BID) | ORAL | Status: DC
Start: 2023-09-29 — End: 2023-09-30
  Administered 2023-09-30: 325 mg via ORAL
  Filled 2023-09-29 (×2): qty 1

## 2023-09-29 MED ORDER — IPRATROPIUM-ALBUTEROL 0.5-2.5 (3) MG/3ML IN SOLN
3.0000 mL | Freq: Four times a day (QID) | RESPIRATORY_TRACT | Status: DC
  Administered 2023-09-29 – 2023-09-30 (×4): 3 mL via RESPIRATORY_TRACT
  Filled 2023-09-29 (×4): qty 3

## 2023-09-29 MED ORDER — HYDROMORPHONE HCL 2 MG PO TABS: 2.0000 mg | ORAL_TABLET | ORAL | Status: DC | PRN

## 2023-09-29 MED ORDER — INSULIN DETEMIR 100 UNIT/ML ~~LOC~~ SOLN: 25.0000 [IU] | Freq: Every day | SUBCUTANEOUS | Status: DC

## 2023-09-29 MED ORDER — INSULIN ASPART 100 UNIT/ML IJ SOLN
0.0000 [IU] | Freq: Every day | INTRAMUSCULAR | Status: DC
  Administered 2023-09-29: 5 [IU] via SUBCUTANEOUS
  Filled 2023-09-29: qty 1

## 2023-09-29 MED ORDER — HEPARIN SODIUM (PORCINE) 1000 UNIT/ML DIALYSIS
1000.0000 [IU] | INTRAMUSCULAR | Status: DC | PRN
Start: 2023-09-29 — End: 2023-09-29

## 2023-09-29 MED ORDER — INSULIN ASPART 100 UNIT/ML IJ SOLN
0.0000 [IU] | Freq: Three times a day (TID) | INTRAMUSCULAR | Status: DC
  Administered 2023-09-29: 8 [IU] via SUBCUTANEOUS
  Administered 2023-09-29 – 2023-09-30 (×2): 15 [IU] via SUBCUTANEOUS
  Filled 2023-09-29 (×3): qty 1

## 2023-09-29 MED ORDER — SODIUM CHLORIDE 0.9 % IV SOLN: 250.0000 mL | INTRAVENOUS | Status: AC | PRN

## 2023-09-29 MED ORDER — ONDANSETRON HCL 4 MG/2ML IJ SOLN
4.0000 mg | Freq: Once | INTRAMUSCULAR | Status: AC
  Administered 2023-09-29: 4 mg via INTRAVENOUS
  Filled 2023-09-29: qty 2

## 2023-09-29 MED ORDER — LINACLOTIDE 145 MCG PO CAPS
145.0000 ug | ORAL_CAPSULE | Freq: Every day | ORAL | Status: DC
  Filled 2023-09-29: qty 1

## 2023-09-29 MED ORDER — ACETAMINOPHEN 325 MG PO TABS: 650.0000 mg | ORAL_TABLET | ORAL | Status: DC | PRN

## 2023-09-29 MED ORDER — GABAPENTIN 100 MG PO CAPS
100.0000 mg | ORAL_CAPSULE | Freq: Three times a day (TID) | ORAL | Status: DC
  Administered 2023-09-29 – 2023-09-30 (×3): 100 mg via ORAL
  Filled 2023-09-29 (×3): qty 1

## 2023-09-29 MED ORDER — PREDNISONE 50 MG PO TABS
50.0000 mg | ORAL_TABLET | Freq: Every day | ORAL | Status: DC
  Administered 2023-09-29 – 2023-09-30 (×2): 50 mg via ORAL
  Filled 2023-09-29 (×2): qty 1

## 2023-09-29 MED ORDER — PANTOPRAZOLE SODIUM 40 MG PO TBEC
40.0000 mg | DELAYED_RELEASE_TABLET | Freq: Every day | ORAL | Status: DC
  Administered 2023-09-30: 40 mg via ORAL
  Filled 2023-09-29 (×2): qty 1

## 2023-09-29 MED ORDER — CLOPIDOGREL BISULFATE 75 MG PO TABS
75.0000 mg | ORAL_TABLET | Freq: Every day | ORAL | Status: DC
  Administered 2023-09-30: 75 mg via ORAL
  Filled 2023-09-29 (×2): qty 1

## 2023-09-29 MED ORDER — ONDANSETRON HCL 4 MG/2ML IJ SOLN: 4.0000 mg | Freq: Four times a day (QID) | INTRAMUSCULAR | Status: DC | PRN

## 2023-09-29 MED ORDER — LIDOCAINE-PRILOCAINE 2.5-2.5 % EX CREA: 1.0000 | TOPICAL_CREAM | CUTANEOUS | Status: DC | PRN

## 2023-09-29 MED ORDER — LIDOCAINE 5 % EX PTCH
1.0000 | MEDICATED_PATCH | CUTANEOUS | Status: DC
  Administered 2023-09-29: 1 via TRANSDERMAL
  Filled 2023-09-29 (×2): qty 1

## 2023-09-29 MED ORDER — CLONIDINE HCL 0.1 MG PO TABS
0.3000 mg | ORAL_TABLET | Freq: Three times a day (TID) | ORAL | Status: DC
  Administered 2023-09-29 – 2023-09-30 (×2): 0.3 mg via ORAL
  Filled 2023-09-29 (×3): qty 3

## 2023-09-29 MED ORDER — MOMETASONE FURO-FORMOTEROL FUM 200-5 MCG/ACT IN AERO
2.0000 | INHALATION_SPRAY | Freq: Two times a day (BID) | RESPIRATORY_TRACT | Status: DC
  Filled 2023-09-29: qty 8.8

## 2023-09-29 MED ORDER — LABETALOL HCL 5 MG/ML IV SOLN
5.0000 mg | Freq: Once | INTRAVENOUS | Status: AC
  Administered 2023-09-29: 5 mg via INTRAVENOUS
  Filled 2023-09-29: qty 4

## 2023-09-29 MED ORDER — HEPARIN SODIUM (PORCINE) 5000 UNIT/ML IJ SOLN
5000.0000 [IU] | Freq: Three times a day (TID) | INTRAMUSCULAR | Status: DC
  Administered 2023-09-29 – 2023-09-30 (×4): 5000 [IU] via SUBCUTANEOUS
  Filled 2023-09-29 (×4): qty 1

## 2023-09-29 MED ORDER — FUROSEMIDE 40 MG PO TABS
80.0000 mg | ORAL_TABLET | Freq: Every day | ORAL | Status: DC
  Administered 2023-09-29 – 2023-09-30 (×2): 80 mg via ORAL
  Filled 2023-09-29 (×2): qty 2

## 2023-09-29 MED ORDER — ASPIRIN 81 MG PO TBEC
81.0000 mg | DELAYED_RELEASE_TABLET | Freq: Every day | ORAL | Status: DC
  Administered 2023-09-29 – 2023-09-30 (×2): 81 mg via ORAL
  Filled 2023-09-29 (×2): qty 1

## 2023-09-29 MED ORDER — AMLODIPINE BESYLATE 5 MG PO TABS
5.0000 mg | ORAL_TABLET | Freq: Every day | ORAL | Status: DC
  Administered 2023-09-30: 5 mg via ORAL
  Filled 2023-09-29 (×2): qty 1

## 2023-09-29 MED ORDER — MORPHINE SULFATE (PF) 4 MG/ML IV SOLN
4.0000 mg | Freq: Once | INTRAVENOUS | Status: AC
  Administered 2023-09-29: 4 mg via INTRAVENOUS
  Filled 2023-09-29: qty 1

## 2023-09-29 MED ORDER — FLUTICASONE PROPIONATE 50 MCG/ACT NA SUSP: 1.0000 | Freq: Every day | NASAL | Status: DC | PRN

## 2023-09-29 MED ORDER — EZETIMIBE 10 MG PO TABS
10.0000 mg | ORAL_TABLET | Freq: Every evening | ORAL | Status: DC
  Administered 2023-09-29: 10 mg via ORAL
  Filled 2023-09-29: qty 1

## 2023-09-29 MED ORDER — ATORVASTATIN CALCIUM 80 MG PO TABS
80.0000 mg | ORAL_TABLET | Freq: Every day | ORAL | Status: DC
  Administered 2023-09-30: 80 mg via ORAL
  Filled 2023-09-29: qty 4
  Filled 2023-09-29: qty 1

## 2023-09-29 MED ORDER — SODIUM CHLORIDE 0.9% FLUSH
3.0000 mL | Freq: Two times a day (BID) | INTRAVENOUS | Status: DC
  Administered 2023-09-29 – 2023-09-30 (×3): 3 mL via INTRAVENOUS

## 2023-09-29 MED ORDER — INSULIN DETEMIR 100 UNIT/ML ~~LOC~~ SOLN
15.0000 [IU] | Freq: Every day | SUBCUTANEOUS | Status: DC
  Administered 2023-09-29: 15 [IU] via SUBCUTANEOUS
  Filled 2023-09-29 (×2): qty 0.15

## 2023-09-29 NOTE — H&P (Signed)
History and Physical    [name]:[id number] DOB: 09-19-1971 DOA: 09/29/2023  PCP: Pearson Grippe, MD (Confirm with patient/family/NH records and if not entered, this has to be entered at [facility] point of entry) Patient coming from: Home  I have personally briefly reviewed patient's old medical records in [facility]  Chief Complaint: Wheezing, SOB  HPI: Kelly Key is a 52 y.o. male with medical history significant of ESRD on HD TTS, chronic HFpEF, refractory HTN, COPD, morbid obesity, HLD, IDDM with resistance presented with new onset of wheezing and shortness of breath.  Symptoms started 2 days ago, patient started to have wheezing and shortness of breath.  Dry cough no fever or chills, he also developed right-sided pleural chest pain, sharp like worsening with cough and deep breath.  Has been using his Combivent around-the-clock with minimal improvement.  His last dialysis was last Saturday. ED Course: 87% on room air, afebrile, nontachycardic blood pressure elevated SBP 117-180 range, O2 saturation 4 L and 99% on room air.  Checks x-ray showed mild pulmonary congestion.  D-dimer elevated and CT angiogram negative for PE but pulmonary congestion.  Review of Systems: As per HPI otherwise 14 point review of systems negative.    Past Medical History:  Diagnosis Date   Acute diastolic CHF (congestive heart failure) (HCC) 06/11/2012   EF 50-55% [facility])   Chronic kidney disease    COPD (chronic obstructive pulmonary disease) (HCC)    Diabetes mellitus    A1c 11.5 (06/11/2012).   Dyspnea    Gout    Hepatic steatosis 06/11/2012   Elevated LFTs   Hyperlipemia    Malignant hypertension    Microcytic anemia 06/12/2012   Obesity     Past Surgical History:  Procedure Laterality Date   ABDOMINAL AORTOGRAM W/LOWER EXTREMITY N/A 12/01/2022   Procedure: ABDOMINAL AORTOGRAM W/LOWER EXTREMITY;  Surgeon: Cephus Shelling, MD;  [facility]: [facility] INVASIVE CV LAB;  Service:  Cardiovascular;  Laterality: N/A;   AMPUTATION Left 12/03/2022   Procedure: LEFT SECOND TOE AMPUTATION;  Surgeon: Nadara Mustard, MD;  Location: [facility] OR;  Service: Orthopedics;  Laterality: Left;   AV FISTULA PLACEMENT Left 11/11/2019   Procedure: ARTERIOVENOUS (AV) FISTULA CREATION LEFT BRACHIOCEPHALIC ARM;  Surgeon: Nada Libman, MD;  Location: [facility] OR;  Service: Vascular;  Laterality: Left;   COLONOSCOPY WITH PROPOFOL N/A 09/02/2021   Procedure: COLONOSCOPY WITH PROPOFOL;  Surgeon: Wyline Mood, MD;  Location: [facility] ENDOSCOPY;  Service: Gastroenterology;  Laterality: N/A;   IR FLUORO GUIDE CV LINE RIGHT  07/08/2019   IR REMOVAL TUN CV CATH W/O FL  03/23/2020   IR US GUIDE VASC ACCESS RIGHT  07/08/2019   LEFT HEART CATH AND CORONARY ANGIOGRAPHY N/A 06/12/2021   Procedure: LEFT HEART CATH AND CORONARY ANGIOGRAPHY;  Surgeon: Lyn Records, MD;  Location: [facility] INVASIVE CV LAB;  Service: Cardiovascular;  Laterality: N/A;   NO PAST SURGERIES     PERIPHERAL VASCULAR BALLOON ANGIOPLASTY Left 12/01/2022   Procedure: PERIPHERAL VASCULAR BALLOON ANGIOPLASTY;  Surgeon: Cephus Shelling, MD;  Location: [facility] INVASIVE CV LAB;  Service: Cardiovascular;  Laterality: Left;  Anterior tibial   PERIPHERAL VASCULAR INTERVENTION Left 12/01/2022   Procedure: PERIPHERAL VASCULAR INTERVENTION;  Surgeon: Cephus Shelling, MD;  Location: [facility] INVASIVE CV LAB;  Service: Cardiovascular;  Laterality: Left;  sfa   VASCULAR SURGERY       reports that he has never smoked. He has been exposed to tobacco smoke. He has never used smokeless  tobacco. He reports that he does not currently use alcohol. He reports that he does not use drugs.  Allergies  Allergen Reactions   Ozempic (0.25 Or 0.5 Mg-Dose) [Semaglutide(0.25 Or 0.5mg -Dos)] Shortness Of Breath   Hydralazine Anxiety    Hallucinations    Lisinopril Swelling    Family History  Problem Relation Age of Onset   Gout Mother    Asthma Mother    Diabetes Father    Heart failure  Father    Diabetes Sister    Hypertension Brother    Pancreatic cancer Brother    Diabetes Sister      Prior to Admission medications   Medication Sig Start Date End Date Taking? Authorizing Provider  acetaminophen (TYLENOL) 500 MG tablet Take 1,000 mg by mouth every 6 (six) hours as needed for moderate pain or headache.   Yes [provider]  albuterol (VENTOLIN HFA) 108 (90 Base) MCG/ACT inhaler Inhale 2 puffs into the lungs every 4 (four) hours as needed for wheezing or shortness of breath. 05/06/23  Yes Emokpae, Courage, MD  allopurinol (ZYLOPRIM) 100 MG tablet Take 2 tablets (200 mg total) by mouth daily. 08/04/19  Yes Love, Evlyn Kanner, PA-C  amLODipine (NORVASC) 5 MG tablet Take 5 mg by mouth daily. 01/30/22  Yes [provider]  aspirin EC 81 MG tablet Take 1 tablet (81 mg total) by mouth daily. 05/06/23  Yes Emokpae, Courage, MD  atorvastatin (LIPITOR) 80 MG tablet Take 1 tablet (80 mg total) by mouth daily. 05/06/23  Yes Shon Hale, MD  carvedilol (COREG) 25 MG tablet Take 1 tablet (25 mg total) by mouth 2 (two) times daily with a meal. 05/06/23  Yes Emokpae, Courage, MD  cloNIDine (CATAPRES) 0.3 MG tablet Take 1 tablet (0.3 mg total) by mouth 3 (three) times daily. 05/06/23  Yes Emokpae, Courage, MD  clopidogrel (PLAVIX) 75 MG tablet Take 75 mg by mouth daily. 08/26/23  Yes [provider]  esomeprazole (NEXIUM) 20 MG capsule Take 20 mg by mouth daily. 01/23/22  Yes [provider]  ezetimibe (ZETIA) 10 MG tablet Take 10 mg by mouth every evening. 01/20/19  Yes [provider]  fluticasone (FLONASE) 50 MCG/ACT nasal spray Place 1 spray into both nostrils daily as needed for allergies or rhinitis.   Yes [provider]  furosemide (LASIX) 80 MG tablet Take 80 mg by mouth daily.   Yes [provider]  gabapentin (NEURONTIN) 100 MG capsule Take 1 capsule (100 mg total) by mouth 3 (three) times daily. 08/03/19  Yes Love, Evlyn Kanner,  PA-C  HUMALOG KWIKPEN 100 UNIT/ML KwikPen Inject 8-30 Units into the skin 3 (three) times daily. Sliding scale 30 units max TID 08/19/22  Yes [provider]  insulin detemir (LEVEMIR FLEXTOUCH) 100 UNIT/ML FlexPen Inject 50 Units into the skin at bedtime. 05/06/23  Yes Emokpae, Courage, MD  Ipratropium-Albuterol (COMBIVENT) 20-100 MCG/ACT AERS respimat Inhale 1 puff into the lungs every 6 (six) hours as needed for wheezing or shortness of breath. 05/06/23 09/29/23 Yes Emokpae, Courage, MD  linaclotide (LINZESS) 145 MCG CAPS capsule Take 145 mcg by mouth daily before breakfast.   Yes [provider]  multivitamin (RENA-VIT) TABS tablet Take 1 tablet by mouth at bedtime. 08/03/19  Yes Love, Evlyn Kanner, PA-C  sevelamer carbonate (RENVELA) 800 MG tablet Take 3 tablets (2,400 mg total) by mouth 3 (three) times daily with meals. Patient taking differently: Take 2,400 mg by mouth 3 (three) times daily with meals. And once daily  with snacks 08/03/19  Yes Love, Evlyn Kanner, PA-C  VELPHORO 500 MG chewable tablet Chew 500 mg by mouth 3 (three) times daily. 08/13/23  Yes [provider]  AMBULATORY NON FORMULARY MEDICATION Inject 0.2 ml by intracavernosal route as directed.  Medication Name: TriMix PGE 30 mcg Pap   30 mg Phent 1 mg 07/01/23   McKenzie, Mardene Celeste, MD  glucose blood (ACCU-CHEK AVIVA PLUS) test strip Use as instructed TO CHECK BLOOD GLUCOSE THREE TIMES DAILY 08/15/20   Nida, Denman George, MD  Insulin Pen Needle (B-D ULTRAFINE III SHORT PEN) 31G X 8 MM MISC 1 each by Does not apply route as directed. 08/13/20   Roma Kayser, MD    Physical Exam: Vitals:   09/29/23 0730 09/29/23 0800 09/29/23 0830 09/29/23 0900  BP: (!) 177/100 (!) 185/120 (!) 174/120 (!) 150/113  Pulse: 88 92 88 96  Resp: 15 14 15 16   Temp:      TempSrc:      SpO2: 97% 99% 92% 94%  Weight:      Height:        Constitutional: NAD, calm, comfortable Vitals:   09/29/23 0730 09/29/23 0800  09/29/23 0830 09/29/23 0900  BP: (!) 177/100 (!) 185/120 (!) 174/120 (!) 150/113  Pulse: 88 92 88 96  Resp: 15 14 15 16   Temp:      TempSrc:      SpO2: 97% 99% 92% 94%  Weight:      Height:       Eyes: PERRL, lids and conjunctivae normal ENMT: Mucous membranes are moist. Posterior pharynx clear of any exudate or lesions.Normal dentition.  Neck: normal, supple, no masses, no thyromegaly Respiratory: Diminished breath sound bilaterally, scattered wheezing, fine crackles on bilateral lower fields, increasing breathing effort. No accessory muscle use.  Cardiovascular: Regular rate and rhythm, no murmurs / rubs / gallops. No extremity edema. 2+ pedal pulses. No carotid bruits.  Abdomen: no tenderness, no masses palpated. No hepatosplenomegaly. Bowel sounds positive.  Musculoskeletal: no clubbing / cyanosis. No joint deformity upper and lower extremities. Good ROM, no contractures. Normal muscle tone.  Skin: no rashes, lesions, ulcers. No induration Neurologic: CN 2-12 grossly intact. Sensation intact, DTR normal. Strength 5/5 in all 4.  Psychiatric: Normal judgment and insight. Alert and oriented x 3. Normal mood.     Labs on Admission: I have personally reviewed following labs and imaging studies  CBC: Recent Labs  Lab 09/22/23 2122 09/29/23 0104  WBC 8.2 10.7*  NEUTROABS  --  7.1  HGB 10.1* 9.8*  HCT 30.8* 31.1*  MCV 87.7 90.9  PLT 211 316   Basic Metabolic Panel: Recent Labs  Lab 09/22/23 2122 09/29/23 0104  NA  --  135  K  --  4.4  CL  --  95*  CO2  --  26  GLUCOSE  --  117*  BUN  --  79*  CREATININE 8.28* 11.57*  CALCIUM  --  8.1*   GFR: Estimated Creatinine Clearance: 10.1 mL/min (A) (by C-G formula based on SCr of 11.57 mg/dL (H)). Liver Function Tests: Recent Labs  Lab 09/29/23 0104  AST 21  ALT 32  ALKPHOS 70  BILITOT 0.6  PROT 7.8  ALBUMIN 3.8   No results for input(s): "LIPASE", "AMYLASE" in the last 168 hours. No results for input(s): "AMMONIA"  in the last 168 hours. Coagulation Profile: No results for input(s): "INR", "PROTIME" in the last 168 hours. Cardiac Enzymes: No results for input(s): "CKTOTAL", "CKMB", "CKMBINDEX", "  TROPONINI" in the last 168 hours. BNP (last 3 results) No results for input(s): "PROBNP" in the last 8760 hours. HbA1C: No results for input(s): "HGBA1C" in the last 72 hours. CBG: Recent Labs  Lab 09/23/23 0250 09/23/23 0742 09/23/23 1722  GLUCAP 291* 357* 271*   Lipid Profile: No results for input(s): "CHOL", "HDL", "LDLCALC", "TRIG", "CHOLHDL", "LDLDIRECT" in the last 72 hours. Thyroid Function Tests: No results for input(s): "TSH", "T4TOTAL", "FREET4", "T3FREE", "THYROIDAB" in the last 72 hours. Anemia Panel: No results for input(s): "VITAMINB12", "FOLATE", "FERRITIN", "TIBC", "IRON", "RETICCTPCT" in the last 72 hours. Urine analysis:    Component Value Date/Time   COLORURINE STRAW (A) 04/20/2021 1740   APPEARANCEUR CLEAR 04/20/2021 1740   LABSPEC 1.011 04/20/2021 1740   PHURINE 8.0 04/20/2021 1740   GLUCOSEU 150 (A) 04/20/2021 1740   HGBUR NEGATIVE 04/20/2021 1740   BILIRUBINUR NEGATIVE 04/20/2021 1740   KETONESUR NEGATIVE 04/20/2021 1740   PROTEINUR 100 (A) 04/20/2021 1740   UROBILINOGEN 0.2 06/11/2012 0518   NITRITE NEGATIVE 04/20/2021 1740   LEUKOCYTESUR NEGATIVE 04/20/2021 1740    Radiological Exams on Admission: CT Angio Chest PE W and/or Wo Contrast  Result Date: 09/29/2023 CLINICAL DATA:  52 year old male with right side pain and shortness of breath. Abnormal D-dimer. EXAM: CT ANGIOGRAPHY CHEST WITH CONTRAST TECHNIQUE: Multidetector CT imaging of the chest was performed using the standard protocol during bolus administration of intravenous contrast. Multiplanar CT image reconstructions and MIPs were obtained to evaluate the vascular anatomy. RADIATION DOSE REDUCTION: This exam was performed according to the departmental dose-optimization program which includes automated exposure  control, adjustment of the mA and/or kV according to patient size and/or use of iterative reconstruction technique. CONTRAST:  OMNIPAQUE IOHEXOL 350 MG/ML SOLN COMPARISON:  Portable chest 0133 hours today. FINDINGS: Cardiovascular: Adequate contrast bolus timing in the pulmonary arterial tree. Mild respiratory motion. No central, saddle, lobar pulmonary embolus. No convincing pulmonary artery filling defect. Calcified coronary artery atherosclerosis. Cardiomegaly. No pericardial effusion. Calcified aortic atherosclerosis. Negative for thoracic aortic aneurysm or dissection. Mediastinum/Nodes: Negative. Subcentimeter mediastinal lymph nodes are at the upper limits of normal. Lungs/Pleura: Atelectatic changes to the major airways. Difficult to exclude superimposed middle and lower lobe circumferential bronchial wall thickening such as on series 5, image 67. And this continues distally in the lower lobes. Major airways remain patent. Mosaic attenuation with some superimposed pulmonary septal thickening bilaterally. Superimposed symmetric dependent lung opacity. No consolidation. No convincing pleural effusion. Upper Abdomen: Negative visible liver, gallbladder, spleen, pancreas, adrenal glands, and bowel in the upper abdomen. Compared to 04/20/2021 CT Abdomen and Pelvis partially visible renal volume loss not significantly changed. Musculoskeletal: Thoracic spine endplate degeneration with flowing endplate osteophytes. No acute or suspicious osseous lesion. Review of the MIP images confirms the above findings. IMPRESSION: 1. No pulmonary embolus identified. 2. Nonspecific pulmonary septal and airway thickening with mosaic attenuation and atelectasis. Top differential considerations include pulmonary edema, viral/atypical airway or respiratory infection. No pleural effusion. 3. Cardiomegaly with calcified coronary artery atherosclerosis. Aortic Atherosclerosis (ICD10-I70.0). Electronically Signed   By: Odessa Fleming  M.D.   On: 09/29/2023 07:11   DG Chest Port 1 View  Result Date: 09/29/2023 CLINICAL DATA:  Shortness of breath and right-sided pain. EXAM: PORTABLE CHEST 1 VIEW COMPARISON:  September 22, 2023 FINDINGS: The heart size and mediastinal contours are within normal limits. There is mild to moderate severity prominence of the central pulmonary vasculature. Mild atelectatic changes are noted within the bilateral lung bases. No pleural effusion or pneumothorax  is identified. Multilevel degenerative changes seen throughout the thoracic spine. IMPRESSION: 1. Mild to moderate severity pulmonary vascular congestion. 2. Mild bibasilar atelectasis. Electronically Signed   By: Aram Candela M.D.   On: 09/29/2023 04:00    EKG: Independently reviewed.  Sinus rhythm, no acute ST changes, first-degree AV block.  Assessment/Plan Principal Problem:   Fluid overload Active Problems:   ESRD (end stage renal disease) (HCC)   CHF (congestive heart failure) (HCC)  (please populate well all problems here in Problem List. (For example, if patient is on BP meds at home and you resume or decide to hold them, it is a problem that needs to be her. Same for CAD, COPD, HLD and so on)  Acute hypoxic respiratory failure -Multifactorial, likely secondary to a combined effect from acute COPD exacerbation as well as acute on chronic HFpEF decompensation -Continue oxygen support  Acute on chronic HFpEF decompensation -Discussed with on-call nephrology, patient to have HD today -Resume home BP meds -Cardiogram was done recently, will not repeat echo on this admission.  Acute COPD exacerbation -Short course of p.o. prednisone -Start ICS and LABA -DuoNebs every 6 hours plus as needed albuterol -Incentive spirometry -Outpatient pulmonology follow-up for lung function test.  Refractory HTN, uncontrolled -Resume home BP meds including clonidine, p.o. Lasix, amlodipine, Coreg,  IDDM with insulin resistance -Cut down  long-acting insulin to 15 units daily -SSI  Chronic iron deficiency anemia -Low iron study 6 months ago, start iron supplement, outpatient follow-up with GI for colonoscopy.  Morbid obesity -BMI> 50, recommend outpatient bariatric evaluation  ESRD on HD -HD today  DVT prophylaxis: Heparin subcu Code Status: Full code Family Communication: Wife at bedside Disposition Plan: Patient is sick with acute hypoxic respiratory failure from combined COPD exacerbation and decompensated CHF requiring inpatient dialysis and inpatient COPD treatment, expect more than 2 midnight hospital stay. Consults called: Nephrology Admission status: Telemetry admission   Emeline General MD [facility] Pager [phone]  09/29/2023, 9:30 AM

## 2023-09-29 NOTE — TOC Initial Note (Signed)
Transition of Care [facility]) - Initial/Assessment Note    Patient Details  Name: Kelly Key MRN: [id number] Date of Birth: Feb 22, 1971  [facility] CM/SW Contact:    Marquita Palms, LCSW Phone Number: 09/29/2023, 11:46 AM  Clinical Narrative:                  CSW spoke with patient and friend bedside. Patients friend reported that he has not been feeling well since dialysis treatments. Patient reports he uses [facility] in [place] for pharmacy needs. Patient reports that he ha has an appointment next week with his PCP. Patient reports he normal des not need O2 and never needed HH. Patient reports using shower chair and has wheelchair when needed. No other needs at this time.       Patient Goals and CMS Choice            Expected Discharge Plan and Services                                              Prior Living Arrangements/Services                       Activities of Daily Living      Permission Sought/Granted                  Emotional Assessment              Admission diagnosis:  Fluid overload [E87.70] CHF (congestive heart failure) (HCC) [I50.9] Patient Active Problem List   Diagnosis Date Noted   Fluid overload 09/29/2023   CHF (congestive heart failure) (HCC) 09/29/2023   Hypertensive urgency 09/22/2023   Elevated troponin 09/22/2023   ESRD (end stage renal disease) on dialysis (HCC) 05/06/2023   Hyperkalemia 05/05/2023   PAD (peripheral artery disease) (HCC) 12/01/2022   Gangrene of toe of left foot (HCC) 11/28/2022   Type 2 diabetes mellitus with obesity (HCC) 11/27/2022   GERD (gastroesophageal reflux disease) 11/27/2022   Flash pulmonary edema (HCC) 11/04/2022   Dysphagia 10/18/2022   Osteomyelitis of second toe of left foot (HCC) 11/13/2021   Osteomyelitis (HCC) 11/13/2021   OSA (obstructive sleep apnea) 10/04/2021   Obesity hypoventilation syndrome (HCC) 10/04/2021   Organic impotence 08/16/2021    NSTEMI (non-ST elevated myocardial infarction) (HCC) 05/17/2021   Chronic heart failure with preserved ejection fraction (HCC) 08/31/2020   Hypertension secondary to other renal disorders 08/31/2020   Morbid obesity with BMI of 45.0-49.9, adult (HCC) 08/31/2020   Acute gout of right knee 08/03/2019   Orthostasis    Pain    Physical debility 07/16/2019   ESRD (end stage renal disease) (HCC)    Anemia of chronic disease    Uncontrolled type 2 diabetes mellitus with hyperglycemia (HCC)    Volume overload 07/05/2019   Anasarca 07/03/2019   Dyslipidemia 07/03/2019   Chronic combined systolic and diastolic CHF (congestive heart failure) (HCC) 07/03/2019   Bilateral leg edema    Hypokalemia    Acute on chronic diastolic HF (heart failure) (HCC) 05/05/2019   Thromboembolism (HCC) 02/20/2019   Acute on chronic diastolic CHF (congestive heart failure) (HCC) 03/26/2018   Hyponatremia 06/12/2012   Microcytic anemia 06/12/2012   Essential hypertension, benign 06/11/2012   Elevated LFTs 06/11/2012   Morbid obesity (HCC) 06/11/2012   Hepatic steatosis 06/11/2012   PCP:  Pearson Grippe, MD Pharmacy:   [facility] [address] [place] - [address] [place] [address] Phone: [phone] Fax: [phone]     Social Determinants of Health (SDOH) Social History: SDOH Screenings   Food Insecurity: No Food Insecurity (01/12/2023)  Housing: Low Risk  (01/12/2023)  Transportation Needs: No Transportation Needs (01/12/2023)  Utilities: Not At Risk (01/12/2023)  Depression (PHQ2-9): Low Risk  (12/25/2021)  Financial Resource Strain: Low Risk  (02/20/2019)  Physical Activity: Unknown (02/20/2019)  Social Connections: Unknown (02/20/2019)  Stress: No Stress Concern Present (02/20/2019)  Tobacco Use: Low Risk  (09/29/2023)   SDOH Interventions:     Readmission Risk Interventions    09/29/2023   11:45 AM 01/12/2023    1:18 PM 12/02/2022    4:02 PM  Readmission Risk Prevention Plan   Transportation Screening Complete  Complete  PCP or Specialist Appt within 3-5 Days Complete    HRI or Home Care Consult Complete    Social Work Consult for Recovery Care Planning/Counseling Complete    Palliative Care Screening Complete    Medication Review Oceanographer) Complete Complete Complete  HRI or Home Care Consult  Complete Complete  SW Recovery Care/Counseling Consult  Complete Complete  Palliative Care Screening  Not Applicable Not Applicable  Skilled Nursing Facility  Not Applicable Not Applicable

## 2023-09-29 NOTE — Progress Notes (Signed)
Hemodialysis note  Received patient in bed to unit. Alert and oriented.  Informed consent signed and in chart.  Treatment initiated: 1414 Treatment completed: 1655  Patient tolerated well. Transported back to room, alert without acute distress.  Report given to patient's RN.   Access used: Right Chest HD Catheter Access issues: none  Total UF removed: 2.5L Medication(s) given:  none  Post HD weight: 150.2 kg   Wolfgang Phoenix [facility]

## 2023-09-29 NOTE — ED Triage Notes (Signed)
Pt to triage via w/c, appears uncomfortable; dialysis Tues/Thur/Sat (last tx Sat); c/o SHOB, pain to rt side

## 2023-09-29 NOTE — ED Notes (Signed)
RN introduced self to pt, helped pt reposition in bed, pt denies any needs at this time

## 2023-09-29 NOTE — Progress Notes (Signed)
[facility]  ROUNDING NOTE   Subjective:   Kelly Key is a 52 y.o. male with past medical history of COPD, diabetes, gout, HLP, Hypertension and end stage renal disease on hemodialysis. He presents to ED with shortness of breath.  Patient has been admitted for CHF (congestive heart failure) (HCC) [I50.9] Acute respiratory failure with hypoxia (HCC) [J96.01] Pulmonary vascular congestion [R09.89] Fluid overload [E87.70] Right-sided chest pain [R07.9]   He is known to our practice and receives outpatient dialysis treatments at [facility] on a TTS schedule. Last treatment received on Saturday. Patient states he has drank more fluid then usual with the holiday. Patient was seen in ED last week for same symptoms and required two, consecutive days of dialysis to removal excess fluid. Patient appears uncomfortable. Reports a pain in his right side, denies harm or falls.   Labs on ED arrival concerning for BUN 79, creatinine 11.57 with GFR 5, BNP 631, troponin 33, white count 10.7 and hemoglobin 9.8.  Chest x-ray shows mild to moderate pulmonary vascular congestion with mild bibasilar atelectasis.  We have been consulted to provide urgent dialysis due to shortness of breath.   Objective:  Vital signs in last 24 hours:  Temp:  [97.7 F (36.5 C)-97.9 F (36.6 C)] 97.7 F (36.5 C) (12/03 1320) Pulse Rate:  [84-96] 91 (12/03 1400) Resp:  [14-20] 16 (12/03 1400) BP: (150-190)/(85-120) 171/103 (12/03 1400) SpO2:  [87 %-100 %] 99 % (12/03 1400) Weight:  [145.2 kg-154.9 kg] 154.9 kg (12/03 1320)  Weight change:  Filed Weights   09/29/23 0110 09/29/23 1320  Weight: (!) 145.2 kg (!) 154.9 kg    Intake/Output: No intake/output data recorded.   Intake/Output this shift:  Total I/O In: -  Out: 300 [Urine:300]  Physical Exam: General: Restless  Head: Normocephalic, atraumatic. Moist oral mucosal membranes  Eyes: Anicteric  Lungs:  Crackles throughout, tachypneic, NCO  2  Heart: Regular rate and rhythm  Abdomen:  Soft, nontender, obese  Extremities: Nonpitting peripheral edema.  Neurologic: Alert and oriented, moving all four extremities  Skin: No lesions  Access: Left upper aVF    Basic Metabolic Panel: Recent Labs  Lab 09/22/23 2122 09/29/23 0104  NA  --  135  K  --  4.4  CL  --  95*  CO2  --  26  GLUCOSE  --  117*  BUN  --  79*  CREATININE 8.28* 11.57*  CALCIUM  --  8.1*    Liver Function Tests: Recent Labs  Lab 09/29/23 0104  AST 21  ALT 32  ALKPHOS 70  BILITOT 0.6  PROT 7.8  ALBUMIN 3.8   No results for input(s): "LIPASE", "AMYLASE" in the last 168 hours. No results for input(s): "AMMONIA" in the last 168 hours.  CBC: Recent Labs  Lab 09/22/23 2122 09/29/23 0104  WBC 8.2 10.7*  NEUTROABS  --  7.1  HGB 10.1* 9.8*  HCT 30.8* 31.1*  MCV 87.7 90.9  PLT 211 316    Cardiac Enzymes: No results for input(s): "CKTOTAL", "CKMB", "CKMBINDEX", "TROPONINI" in the last 168 hours.  BNP: Invalid input(s): "POCBNP"  CBG: Recent Labs  Lab 09/23/23 0250 09/23/23 0742 09/23/23 1722 09/29/23 1234  GLUCAP 291* 357* 271* 258*    Microbiology: Results for orders placed or performed during the hospital encounter of 09/14/23  Resp panel by RT-PCR (RSV, Flu A&B, Covid) Anterior Nasal Swab     Status: None   Collection Time: 09/14/23  7:37 AM   Specimen:  Anterior Nasal Swab  Result Value Ref Range Status   SARS Coronavirus 2 by RT PCR NEGATIVE NEGATIVE Final    Comment: (NOTE) SARS-CoV-2 target nucleic acids are NOT DETECTED.  The SARS-CoV-2 RNA is generally detectable in upper respiratory specimens during the acute phase of infection. The lowest concentration of SARS-CoV-2 viral copies this assay can detect is 138 copies/mL. A negative result does not preclude SARS-Cov-2 infection and should not be used as the sole basis for treatment or other patient management decisions. A negative result may occur with  improper  specimen collection/handling, submission of specimen other than nasopharyngeal swab, presence of viral mutation(s) within the areas targeted by this assay, and inadequate number of viral copies(<138 copies/mL). A negative result must be combined with clinical observations, patient history, and epidemiological information. The expected result is Negative.  Fact Sheet for Patients:  BloggerCourse.com  Fact Sheet for Healthcare Providers:  SeriousBroker.it  This test is no t yet approved or cleared by the [place] FDA and  has been authorized for detection and/or diagnosis of SARS-CoV-2 by FDA under an Emergency Use Authorization (EUA). This EUA will remain  in effect (meaning this test can be used) for the duration of the COVID-19 declaration under Section 564(b)(1) of the Act, 21 U.S.C.section 360bbb-3(b)(1), unless the authorization is terminated  or revoked sooner.       Influenza A by PCR NEGATIVE NEGATIVE Final   Influenza B by PCR NEGATIVE NEGATIVE Final    Comment: (NOTE) The Xpert Xpress SARS-CoV-2/FLU/RSV plus assay is intended as an aid in the diagnosis of influenza from Nasopharyngeal swab specimens and should not be used as a sole basis for treatment. Nasal washings and aspirates are unacceptable for Xpert Xpress SARS-CoV-2/FLU/RSV testing.  Fact Sheet for Patients: BloggerCourse.com  Fact Sheet for Healthcare Providers: SeriousBroker.it  This test is not yet approved or cleared by the [place] FDA and has been authorized for detection and/or diagnosis of SARS-CoV-2 by FDA under an Emergency Use Authorization (EUA). This EUA will remain in effect (meaning this test can be used) for the duration of the COVID-19 declaration under Section 564(b)(1) of the Act, 21 U.S.C. section 360bbb-3(b)(1), unless the authorization is terminated or revoked.     Resp  Syncytial Virus by PCR NEGATIVE NEGATIVE Final    Comment: (NOTE) Fact Sheet for Patients: BloggerCourse.com  Fact Sheet for Healthcare Providers: SeriousBroker.it  This test is not yet approved or cleared by the [place] FDA and has been authorized for detection and/or diagnosis of SARS-CoV-2 by FDA under an Emergency Use Authorization (EUA). This EUA will remain in effect (meaning this test can be used) for the duration of the COVID-19 declaration under Section 564(b)(1) of the Act, 21 U.S.C. section 360bbb-3(b)(1), unless the authorization is terminated or revoked.  Performed at [facility], [address] [place], [place] [address]     Coagulation Studies: No results for input(s): "LABPROT", "INR" in the last 72 hours.  Urinalysis: No results for input(s): "COLORURINE", "LABSPEC", "PHURINE", "GLUCOSEU", "HGBUR", "BILIRUBINUR", "KETONESUR", "PROTEINUR", "UROBILINOGEN", "NITRITE", "LEUKOCYTESUR" in the last 72 hours.  Invalid input(s): "APPERANCEUR"    Imaging: CT Angio Chest PE W and/or Wo Contrast  Result Date: 09/29/2023 CLINICAL DATA:  52 year old male with right side pain and shortness of breath. Abnormal D-dimer. EXAM: CT ANGIOGRAPHY CHEST WITH CONTRAST TECHNIQUE: Multidetector CT imaging of the chest was performed using the standard protocol during bolus administration of intravenous contrast. Multiplanar CT image reconstructions and MIPs were obtained to evaluate the vascular  anatomy. RADIATION DOSE REDUCTION: This exam was performed according to the departmental dose-optimization program which includes automated exposure control, adjustment of the mA and/or kV according to patient size and/or use of iterative reconstruction technique. CONTRAST:  OMNIPAQUE IOHEXOL 350 MG/ML SOLN COMPARISON:  Portable chest 0133 hours today. FINDINGS: Cardiovascular: Adequate contrast bolus timing in the pulmonary arterial tree.  Mild respiratory motion. No central, saddle, lobar pulmonary embolus. No convincing pulmonary artery filling defect. Calcified coronary artery atherosclerosis. Cardiomegaly. No pericardial effusion. Calcified aortic atherosclerosis. Negative for thoracic aortic aneurysm or dissection. Mediastinum/Nodes: Negative. Subcentimeter mediastinal lymph nodes are at the upper limits of normal. Lungs/Pleura: Atelectatic changes to the major airways. Difficult to exclude superimposed middle and lower lobe circumferential bronchial wall thickening such as on series 5, image 67. And this continues distally in the lower lobes. Major airways remain patent. Mosaic attenuation with some superimposed pulmonary septal thickening bilaterally. Superimposed symmetric dependent lung opacity. No consolidation. No convincing pleural effusion. Upper Abdomen: Negative visible liver, gallbladder, spleen, pancreas, adrenal glands, and bowel in the upper abdomen. Compared to 04/20/2021 CT Abdomen and Pelvis partially visible renal volume loss not significantly changed. Musculoskeletal: Thoracic spine endplate degeneration with flowing endplate osteophytes. No acute or suspicious osseous lesion. Review of the MIP images confirms the above findings. IMPRESSION: 1. No pulmonary embolus identified. 2. Nonspecific pulmonary septal and airway thickening with mosaic attenuation and atelectasis. Top differential considerations include pulmonary edema, viral/atypical airway or respiratory infection. No pleural effusion. 3. Cardiomegaly with calcified coronary artery atherosclerosis. Aortic Atherosclerosis (ICD10-I70.0). Electronically Signed   By: Odessa Fleming M.D.   On: 09/29/2023 07:11   DG Chest Port 1 View  Result Date: 09/29/2023 CLINICAL DATA:  Shortness of breath and right-sided pain. EXAM: PORTABLE CHEST 1 VIEW COMPARISON:  September 22, 2023 FINDINGS: The heart size and mediastinal contours are within normal limits. There is mild to moderate  severity prominence of the central pulmonary vasculature. Mild atelectatic changes are noted within the bilateral lung bases. No pleural effusion or pneumothorax is identified. Multilevel degenerative changes seen throughout the thoracic spine. IMPRESSION: 1. Mild to moderate severity pulmonary vascular congestion. 2. Mild bibasilar atelectasis. Electronically Signed   By: Aram Candela M.D.   On: 09/29/2023 04:00     Medications:    sodium chloride      allopurinol  200 mg Oral Daily   amLODipine  5 mg Oral Daily   aspirin EC  81 mg Oral Daily   atorvastatin  80 mg Oral Daily   carvedilol  25 mg Oral BID WC   Chlorhexidine Gluconate Cloth  6 each Topical Q0600   cloNIDine  0.3 mg Oral TID   clopidogrel  75 mg Oral Daily   ezetimibe  10 mg Oral QPM   ferrous sulfate  325 mg Oral BID WC   furosemide  80 mg Oral Daily   gabapentin  100 mg Oral TID   heparin  5,000 Units Subcutaneous Q8H   insulin aspart  0-15 Units Subcutaneous TID WC   insulin aspart  0-5 Units Subcutaneous QHS   insulin detemir  15 Units Subcutaneous QHS   ipratropium-albuterol  3 mL Nebulization Q6H   lidocaine  1 patch Transdermal Q24H   [START ON 09/30/2023] linaclotide  145 mcg Oral QAC breakfast   mometasone-formoterol  2 puff Inhalation BID   pantoprazole  40 mg Oral Daily   predniSONE  50 mg Oral Q breakfast   sevelamer carbonate  2,400 mg Oral TID WC   sodium  chloride flush  3 mL Intravenous Q12H   sucroferric oxyhydroxide  500 mg Oral TID   sodium chloride, acetaminophen, fluticasone, heparin, HYDROmorphone, ipratropium-albuterol, lidocaine-prilocaine, ondansetron (ZOFRAN) IV, pentafluoroprop-tetrafluoroeth, sodium chloride flush  Assessment/ Plan:  Kelly Key is a 52 y.o.  male with past medical history of COPD, diabetes, gout, HLP, Hypertension and end stage renal disease on hemodialysis. He presents to ED with shortness of breath. States he went to his dialysis treatment on Saturday but feels  like they did not take enough fluid off.   [facility] [place]/TTS/ Lt AVF  End stage renal disease on hemodialysis.  Last treatment completed on Saturday. Patient receiving urgent dialysis treatment today, will attempt to remove 4.5 to 5 L as patient tolerates.  Will request additional treatment tomorrow, UF only to remove additional fluid as well.  2. Acute respiratory distress likely due to fluid volume overload requiring supplemental oxygen.  Chest x-ray shows mild to moderate vascular congestion.  Will provide dialysis treatment for fluid removal.  3. Anemia of chronic kidney disease Lab Results  Component Value Date   HGB 9.8 (L) 09/29/2023    Patient receives Mircera at outpatient clinic.  Hgb within range.  4. Secondary Hyperparathyroidism:   Lab Results  Component Value Date   CALCIUM 8.1 (L) 09/29/2023   CAION 1.10 (L) 11/11/2019   PHOS 5.4 (H) 05/06/2023    Will continue to monitor bone minerals during this admission.   LOS: 0 Lora Chavers 12/3/20242:42 PM

## 2023-09-29 NOTE — ED Notes (Addendum)
Pt family asked that I call dialysis and find out when pt will be treated. Per my call with the dialysis team, he should be picked up between 12 & 2 to start his treatment, depending on how their current treatments go. Pt and family were updated on timing.

## 2023-09-29 NOTE — ED Notes (Signed)
Pt returned from CT with SpO2 88% on room air - breathing labored - pt expressing SOB and appears uncomfortable. 2L [place] initiated and SpO2 increased to 90%. Titrated to 4L [place] and SpO2 now 96%.

## 2023-09-29 NOTE — ED Provider Notes (Signed)
[facility] Provider Note    Event Date/Time   First MD Initiated Contact with Patient 09/29/23 0327     (approximate)   History   Shortness of Breath   HPI  Kelly Key is a 52 y.o. male with history of CHF, end-stage renal disease on hemodialysis Tuesday, Thursday and Saturday, hypertension, diabetes, hyperlipidemia, COPD who presents to the emergency department with right-sided chest pain, shortness of breath.  Scheduled to have dialysis this morning.  Has not missed any sessions.  Feels like he is fluid overloaded.  No history of PE or DVT.  No fevers or cough.  Recently admitted for hypervolemia.   History provided by patient, wife.    Past Medical History:  Diagnosis Date   Acute diastolic CHF (congestive heart failure) (HCC) 06/11/2012   EF 50-55% [facility])   Chronic kidney disease    COPD (chronic obstructive pulmonary disease) (HCC)    Diabetes mellitus    A1c 11.5 (06/11/2012).   Dyspnea    Gout    Hepatic steatosis 06/11/2012   Elevated LFTs   Hyperlipemia    Malignant hypertension    Microcytic anemia 06/12/2012   Obesity     Past Surgical History:  Procedure Laterality Date   ABDOMINAL AORTOGRAM W/LOWER EXTREMITY N/A 12/01/2022   Procedure: ABDOMINAL AORTOGRAM W/LOWER EXTREMITY;  Surgeon: Cephus Shelling, MD;  Location: [facility] INVASIVE CV LAB;  Service: Cardiovascular;  Laterality: N/A;   AMPUTATION Left 12/03/2022   Procedure: LEFT SECOND TOE AMPUTATION;  Surgeon: Nadara Mustard, MD;  Location: [facility] OR;  Service: Orthopedics;  Laterality: Left;   AV FISTULA PLACEMENT Left 11/11/2019   Procedure: ARTERIOVENOUS (AV) FISTULA CREATION LEFT BRACHIOCEPHALIC ARM;  Surgeon: Nada Libman, MD;  Location: [facility] OR;  Service: Vascular;  Laterality: Left;   COLONOSCOPY WITH PROPOFOL N/A 09/02/2021   Procedure: COLONOSCOPY WITH PROPOFOL;  Surgeon: Wyline Mood, MD;  Location: [facility] ENDOSCOPY;  Service: Gastroenterology;  Laterality: N/A;   IR  FLUORO GUIDE CV LINE RIGHT  07/08/2019   IR REMOVAL TUN CV CATH W/O FL  03/23/2020   IR US GUIDE VASC ACCESS RIGHT  07/08/2019   LEFT HEART CATH AND CORONARY ANGIOGRAPHY N/A 06/12/2021   Procedure: LEFT HEART CATH AND CORONARY ANGIOGRAPHY;  Surgeon: Lyn Records, MD;  Location: [facility] INVASIVE CV LAB;  Service: Cardiovascular;  Laterality: N/A;   NO PAST SURGERIES     PERIPHERAL VASCULAR BALLOON ANGIOPLASTY Left 12/01/2022   Procedure: PERIPHERAL VASCULAR BALLOON ANGIOPLASTY;  Surgeon: Cephus Shelling, MD;  Location: [facility] INVASIVE CV LAB;  Service: Cardiovascular;  Laterality: Left;  Anterior tibial   PERIPHERAL VASCULAR INTERVENTION Left 12/01/2022   Procedure: PERIPHERAL VASCULAR INTERVENTION;  Surgeon: Cephus Shelling, MD;  Location: [facility] INVASIVE CV LAB;  Service: Cardiovascular;  Laterality: Left;  sfa   VASCULAR SURGERY      MEDICATIONS:  Prior to Admission medications   Medication Sig Start Date End Date Taking? Authorizing Provider  acetaminophen (TYLENOL) 500 MG tablet Take 1,000 mg by mouth every 6 (six) hours as needed for moderate pain or headache.    [provider]  albuterol (VENTOLIN HFA) 108 (90 Base) MCG/ACT inhaler Inhale 2 puffs into the lungs every 4 (four) hours as needed for wheezing or shortness of breath. 05/06/23   Shon Hale, MD  allopurinol (ZYLOPRIM) 100 MG tablet Take 2 tablets (200 mg total) by mouth daily. 08/04/19   Love, Evlyn Kanner, PA-C  AMBULATORY NON FORMULARY MEDICATION Inject 0.2 ml  by intracavernosal route as directed.  Medication Name: TriMix PGE 30 mcg Pap   30 mg Phent 1 mg 07/01/23   Malen Gauze, MD  amLODipine (NORVASC) 5 MG tablet Take 5 mg by mouth daily. 01/30/22   [provider]  aspirin EC 81 MG tablet Take 1 tablet (81 mg total) by mouth daily. 05/06/23   Shon Hale, MD  atorvastatin (LIPITOR) 80 MG tablet Take 1 tablet (80 mg total) by mouth daily. 05/06/23   Shon Hale, MD  carvedilol (COREG) 25 MG tablet  Take 1 tablet (25 mg total) by mouth 2 (two) times daily with a meal. 05/06/23   Emokpae, Courage, MD  cloNIDine (CATAPRES) 0.3 MG tablet Take 1 tablet (0.3 mg total) by mouth 3 (three) times daily. 05/06/23   Shon Hale, MD  clopidogrel (PLAVIX) 75 MG tablet Take 75 mg by mouth daily. 08/26/23   [provider]  esomeprazole (NEXIUM) 20 MG capsule Take 20 mg by mouth daily. 01/23/22   [provider]  ezetimibe (ZETIA) 10 MG tablet Take 10 mg by mouth every evening. 01/20/19   [provider]  fluticasone (FLONASE) 50 MCG/ACT nasal spray Place 1 spray into both nostrils daily as needed for allergies or rhinitis.    [provider]  furosemide (LASIX) 80 MG tablet Take 80 mg by mouth daily.    [provider]  gabapentin (NEURONTIN) 100 MG capsule Take 1 capsule (100 mg total) by mouth 3 (three) times daily. 08/03/19   Love, Evlyn Kanner, PA-C  glucose blood (ACCU-CHEK AVIVA PLUS) test strip Use as instructed TO CHECK BLOOD GLUCOSE THREE TIMES DAILY 08/15/20   Nida, Denman George, MD  HUMALOG KWIKPEN 100 UNIT/ML KwikPen Inject 8-30 Units into the skin 3 (three) times daily. Sliding scale 30 units max TID 08/19/22   [provider]  insulin detemir (LEVEMIR FLEXTOUCH) 100 UNIT/ML FlexPen Inject 50 Units into the skin at bedtime. 05/06/23   Emokpae, Courage, MD  Insulin Pen Needle (B-D ULTRAFINE III SHORT PEN) 31G X 8 MM MISC 1 each by Does not apply route as directed. 08/13/20   Roma Kayser, MD  Ipratropium-Albuterol (COMBIVENT) 20-100 MCG/ACT AERS respimat Inhale 1 puff into the lungs every 6 (six) hours as needed for wheezing or shortness of breath. 05/06/23 09/23/23  Shon Hale, MD  linaclotide (LINZESS) 145 MCG CAPS capsule Take 145 mcg by mouth daily before breakfast.    [provider]  multivitamin (RENA-VIT) TABS tablet Take 1 tablet by mouth at bedtime. 08/03/19   Love, Evlyn Kanner, PA-C  sevelamer carbonate (RENVELA) 800  MG tablet Take 3 tablets (2,400 mg total) by mouth 3 (three) times daily with meals. Patient taking differently: Take 2,400 mg by mouth 3 (three) times daily with meals. And once daily with snacks 08/03/19   Love, Evlyn Kanner, PA-C  VELPHORO 500 MG chewable tablet Chew 500 mg by mouth 3 (three) times daily. 08/13/23   [provider]    Physical Exam   Triage Vital Signs: ED Triage Vitals  Encounter Vitals Group     BP 09/29/23 0105 (!) 167/85     Systolic BP Percentile --      Diastolic BP Percentile --      Pulse Rate 09/29/23 0105 84     Resp 09/29/23 0105 20     Temp 09/29/23 0105 97.7 F (36.5 C)     Temp Source 09/29/23 0105 Oral     SpO2 09/29/23 0105 95 %  Weight 09/29/23 0110 (!) 320 lb (145.2 kg)     Height 09/29/23 0110 5\' 7"  (1.702 m)     Head Circumference --      Peak Flow --      Pain Score 09/29/23 0110 10     Pain Loc --      Pain Education --      Exclude from Growth Chart --     Most recent vital signs: Vitals:   09/29/23 0549 09/29/23 0556  BP:    Pulse: 92 89  Resp: 16 15  Temp:    SpO2: 90% 98%    CONSTITUTIONAL: Alert, responds appropriately to questions.  Chronically ill-appearing HEAD: Normocephalic, atraumatic EYES: Conjunctivae clear, pupils appear equal, sclera nonicteric ENT: normal nose; moist mucous membranes NECK: Supple, normal ROM CARD: RRR; S1 and S2 appreciated RESP: Normal chest excursion without splinting or tachypnea; breath sounds clear and equal bilaterally; no wheezes, no rhonchi, no rales, no hypoxia or respiratory distress, speaking full sentences ABD/GI: Non-distended; soft, non-tender, no rebound, no guarding, no peritoneal signs BACK: The back appears normal EXT: Normal ROM in all joints; no deformity noted, no calf tenderness or calf swelling, no pitting edema SKIN: Normal color for age and race; warm; no rash on exposed skin NEURO: Moves all extremities equally, normal speech PSYCH: The patient's mood and  manner are appropriate.   ED Results / Procedures / Treatments   LABS: (all labs ordered are listed, but only abnormal results are displayed) Labs Reviewed  CBC WITH DIFFERENTIAL/PLATELET - Abnormal; Notable for the following components:      Result Value   WBC 10.7 (*)    RBC 3.42 (*)    Hemoglobin 9.8 (*)    HCT 31.1 (*)    RDW 15.8 (*)    Eosinophils Absolute 1.2 (*)    All other components within normal limits  COMPREHENSIVE METABOLIC PANEL - Abnormal; Notable for the following components:   Chloride 95 (*)    Glucose, Bld 117 (*)    BUN 79 (*)    Creatinine, Ser 11.57 (*)    Calcium 8.1 (*)    GFR, Estimated 5 (*)    All other components within normal limits  BRAIN NATRIURETIC PEPTIDE - Abnormal; Notable for the following components:   B Natriuretic Peptide 631.4 (*)    All other components within normal limits  D-DIMER, QUANTITATIVE - Abnormal; Notable for the following components:   D-Dimer, Quant 0.86 (*)    All other components within normal limits  TROPONIN I (HIGH SENSITIVITY) - Abnormal; Notable for the following components:   Troponin I (High Sensitivity) 33 (*)    All other components within normal limits  TROPONIN I (HIGH SENSITIVITY) - Abnormal; Notable for the following components:   Troponin I (High Sensitivity) 30 (*)    All other components within normal limits     EKG:  EKG Interpretation Date/Time:  Tuesday September 29 2023 01:13:49 EST Ventricular Rate:  81 PR Interval:  222 QRS Duration:  108 QT Interval:  404 QTC Calculation: 469 R Axis:   127  Text Interpretation: Sinus rhythm with 1st degree A-V block Right axis deviation Septal infarct , age undetermined Abnormal ECG When compared with ECG of 22-Sep-2023 06:43, T wave inversion no longer evident in Inferior leads T wave inversion no longer evident in Lateral leads Confirmed by Rochele Raring [phone]) on 09/29/2023 3:35:57 AM         RADIOLOGY: My personal review and interpretation of  imaging:  Chest x-ray shows pulmonary vascular congestion.  I have personally reviewed all radiology reports.   DG Chest Port 1 View  Result Date: 09/29/2023 CLINICAL DATA:  Shortness of breath and right-sided pain. EXAM: PORTABLE CHEST 1 VIEW COMPARISON:  September 22, 2023 FINDINGS: The heart size and mediastinal contours are within normal limits. There is mild to moderate severity prominence of the central pulmonary vasculature. Mild atelectatic changes are noted within the bilateral lung bases. No pleural effusion or pneumothorax is identified. Multilevel degenerative changes seen throughout the thoracic spine. IMPRESSION: 1. Mild to moderate severity pulmonary vascular congestion. 2. Mild bibasilar atelectasis. Electronically Signed   By: Aram Candela M.D.   On: 09/29/2023 04:00     PROCEDURES:  Critical Care performed: Yes, see critical care procedure note(s)   CRITICAL CARE Performed by: Baxter Hire Genesia Caslin   Total critical care time: 40 minutes  Critical care time was exclusive of separately billable procedures and treating other patients.  Critical care was necessary to treat or prevent imminent or life-threatening deterioration.  Critical care was time spent personally by me on the following activities: development of treatment plan with patient and/or surrogate as well as nursing, discussions with consultants, evaluation of patient's response to treatment, examination of patient, obtaining history from patient or surrogate, ordering and performing treatments and interventions, ordering and review of laboratory studies, ordering and review of radiographic studies, pulse oximetry and re-evaluation of patient's condition.   [name]1-3 Lead EKG Interpretation  Performed by: Atianna Haidar, Layla Maw, DO Authorized by: Lonette Stevison, Layla Maw, DO     Interpretation: normal     ECG rate:  84   ECG rate assessment: normal     Rhythm: sinus rhythm     Ectopy: none     Conduction: normal        IMPRESSION / MDM / ASSESSMENT AND PLAN / ED COURSE  I reviewed the triage vital signs and the nursing notes.    Patient here with right-sided chest pain, shortness of breath.  The patient is on the cardiac monitor to evaluate for evidence of arrhythmia and/or significant heart rate changes.   DIFFERENTIAL DIAGNOSIS (includes but not limited to):   Volume overload, pneumonia, PE, less likely ACS   Patient's presentation is most consistent with acute presentation with potential threat to life or bodily function.   PLAN: Labs show slight leukocytosis.  Chronic and stable anemia.  Normal potassium.  BNP is 631.  First troponin is 33.  EKG shows no new ischemic change compared to previous.  Will obtain D-dimer.  Chest x-ray reviewed and interpreted by myself and the radiologist and shows moderate severity pulmonary vascular congestion.  Will give pain and nausea medicine here.   MEDICATIONS GIVEN IN ED: Medications  morphine (PF) 4 MG/ML injection 4 mg (4 mg Intravenous Given 09/29/23 0351)  ondansetron (ZOFRAN) injection 4 mg (4 mg Intravenous Given 09/29/23 0349)  iohexol (OMNIPAQUE) 350 MG/ML injection 100 mL (100 mLs Intravenous Contrast Given 09/29/23 0525)     ED COURSE: Second troponin flat at 30.  D-dimer elevated at 0.86.  Will obtain CTA of the chest.  I feel patient will likely need admission for dialysis today especially given he will now has his normally scheduled dialysis appointment this morning.  Will discuss with hospitalist.   5:55 AM  Pt's sats coming back from CT scan were 88%.  Patient now on 4 L nasal cannula and sats are 96%.  Does not wear oxygen chronically.   CT results pending.  CONSULTS:  Consulted and discussed patient's case with hospitalist, Dr. Arville Care.  I have recommended admission and consulting physician agrees and will place admission orders.  Patient (and family if present) agree with this plan.   I reviewed all nursing notes, vitals,  pertinent previous records.  All labs, EKGs, imaging ordered have been independently reviewed and interpreted by myself.    OUTSIDE RECORDS REVIEWED: Reviewed recent admission.       FINAL CLINICAL IMPRESSION(S) / ED DIAGNOSES   Final diagnoses:  Right-sided chest pain  Pulmonary vascular congestion  Acute respiratory failure with hypoxia (HCC)     Rx / DC Orders   ED Discharge Orders     None        Note:  This document was prepared using Dragon voice recognition software and may include unintentional dictation errors.   Juwann Sherk, Layla Maw, DO 09/29/23 [phone]

## 2023-09-29 NOTE — ED Notes (Signed)
Tech asked to recheck [facility] before taking pt to inpatient room.

## 2023-09-29 NOTE — Progress Notes (Signed)
[facility] - Clinic Note  10/05/2023   CHIEF COMPLAINT Patient presents for Retina Follow Up  HISTORY OF PRESENT ILLNESS: Kelly Key is a 52 y.o. male who presents to the clinic today for:  HPI     Retina Follow Up   Patient presents with  Diabetic Retinopathy.  In both eyes.  Severity is moderate.  Duration of 2 weeks.  Since onset it is stable.  I, the attending physician,  performed the HPI with the patient and updated documentation appropriately.        Comments   Pt here for 2 wk ret f/u NPDR OU. Pt states VA is the same. Does not report any eye drop use. A1C 09/22/23 was 9.5.       Last edited by Rennis Chris, MD on 10/05/2023 12:06 PM.    Pt states he has been "keeping my sugar down"  Referring physician: Pearson Grippe, MD [address] [place],  [place] [address]  HISTORICAL INFORMATION:  Selected notes from the MEDICAL RECORD NUMBER Referred by Dr. Einar Pheasant ([facility]) for diabetic retinopathy LEE:  Ocular Hx- PMH-   CURRENT MEDICATIONS: No current outpatient medications on file. (Ophthalmic Drugs)   No current facility-administered medications for this visit. (Ophthalmic Drugs)   Current Outpatient Medications (Other)  Medication Sig   acetaminophen (TYLENOL) 500 MG tablet Take 1,000 mg by mouth every 6 (six) hours as needed for moderate pain or headache.   albuterol (VENTOLIN HFA) 108 (90 Base) MCG/ACT inhaler Inhale 2 puffs into the lungs every 4 (four) hours as needed for wheezing or shortness of breath.   allopurinol (ZYLOPRIM) 100 MG tablet Take 2 tablets (200 mg total) by mouth daily.   AMBULATORY NON FORMULARY MEDICATION Inject 0.2 ml by intracavernosal route as directed.  Medication Name: TriMix PGE 30 mcg Pap   30 mg Phent 1 mg   amLODipine (NORVASC) 5 MG tablet Take 5 mg by mouth daily.   aspirin EC 81 MG tablet Take 1 tablet (81 mg total) by mouth daily.   atorvastatin (LIPITOR) 80 MG tablet Take 1  tablet (80 mg total) by mouth daily.   carvedilol (COREG) 25 MG tablet Take 1 tablet (25 mg total) by mouth 2 (two) times daily with a meal.   cloNIDine (CATAPRES) 0.3 MG tablet Take 1 tablet (0.3 mg total) by mouth 3 (three) times daily.   clopidogrel (PLAVIX) 75 MG tablet Take 75 mg by mouth daily.   esomeprazole (NEXIUM) 20 MG capsule Take 20 mg by mouth daily.   ezetimibe (ZETIA) 10 MG tablet Take 10 mg by mouth every evening.   ferrous sulfate 325 (65 FE) MG tablet Take 1 tablet (325 mg total) by mouth 2 (two) times daily with a meal.   fluticasone (FLONASE) 50 MCG/ACT nasal spray Place 1 spray into both nostrils daily as needed for allergies or rhinitis.   furosemide (LASIX) 80 MG tablet Take 80 mg by mouth daily.   gabapentin (NEURONTIN) 100 MG capsule Take 1 capsule (100 mg total) by mouth 3 (three) times daily.   glucose blood (ACCU-CHEK AVIVA PLUS) test strip Use as instructed TO CHECK BLOOD GLUCOSE THREE TIMES DAILY   HUMALOG KWIKPEN 100 UNIT/ML KwikPen Inject 8-30 Units into the skin 3 (three) times daily. Sliding scale 30 units max TID   insulin detemir (LEVEMIR FLEXTOUCH) 100 UNIT/ML FlexPen Inject 50 Units into the skin at bedtime.   Insulin Pen Needle (B-D ULTRAFINE III SHORT PEN) 31G  X 8 MM MISC 1 each by Does not apply route as directed.   Ipratropium-Albuterol (COMBIVENT) 20-100 MCG/ACT AERS respimat Inhale 1 puff into the lungs every 6 (six) hours as needed for wheezing or shortness of breath.   linaclotide (LINZESS) 145 MCG CAPS capsule Take 145 mcg by mouth daily before breakfast.   multivitamin (RENA-VIT) TABS tablet Take 1 tablet by mouth at bedtime.   sevelamer carbonate (RENVELA) 800 MG tablet Take 3 tablets (2,400 mg total) by mouth 3 (three) times daily with meals. (Patient taking differently: Take 2,400 mg by mouth 3 (three) times daily with meals. And once daily with snacks)   VELPHORO 500 MG chewable tablet Chew 500 mg by mouth 3 (three) times daily.   Current  Facility-Administered Medications (Other)  Medication Route   0.9 %  sodium chloride infusion Intravenous   REVIEW OF SYSTEMS: ROS   Positive for: Gastrointestinal, Endocrine, Eyes, Respiratory Negative for: Constitutional, Neurological, Skin, Genitourinary, Musculoskeletal, HENT, Cardiovascular, Psychiatric, Allergic/Imm, Heme/Lymph Last edited by Thompson Grayer, COT on 10/05/2023  9:46 AM.       ALLERGIES Allergies  Allergen Reactions   Ozempic (0.25 Or 0.5 Mg-Dose) [Semaglutide(0.25 Or 0.5mg -Dos)] Shortness Of Breath   Hydralazine Anxiety    Hallucinations    Lisinopril Swelling   PAST MEDICAL HISTORY Past Medical History:  Diagnosis Date   Acute diastolic CHF (congestive heart failure) (HCC) 06/11/2012   EF 50-55% [facility])   Chronic kidney disease    COPD (chronic obstructive pulmonary disease) (HCC)    Diabetes mellitus    A1c 11.5 (06/11/2012).   Dyspnea    Gout    Hepatic steatosis 06/11/2012   Elevated LFTs   Hyperlipemia    Malignant hypertension    Microcytic anemia 06/12/2012   Obesity    Past Surgical History:  Procedure Laterality Date   ABDOMINAL AORTOGRAM W/LOWER EXTREMITY N/A 12/01/2022   Procedure: ABDOMINAL AORTOGRAM W/LOWER EXTREMITY;  Surgeon: Cephus Shelling, MD;  Location: [facility] INVASIVE CV LAB;  Service: Cardiovascular;  Laterality: N/A;   AMPUTATION Left 12/03/2022   Procedure: LEFT SECOND TOE AMPUTATION;  Surgeon: Nadara Mustard, MD;  Location: [facility] OR;  Service: Orthopedics;  Laterality: Left;   AV FISTULA PLACEMENT Left 11/11/2019   Procedure: ARTERIOVENOUS (AV) FISTULA CREATION LEFT BRACHIOCEPHALIC ARM;  Surgeon: Nada Libman, MD;  Location: [facility] OR;  Service: Vascular;  Laterality: Left;   COLONOSCOPY WITH PROPOFOL N/A 09/02/2021   Procedure: COLONOSCOPY WITH PROPOFOL;  Surgeon: Wyline Mood, MD;  Location: [facility] ENDOSCOPY;  Service: Gastroenterology;  Laterality: N/A;   IR FLUORO GUIDE CV LINE RIGHT  07/08/2019   IR REMOVAL TUN CV  CATH W/O FL  03/23/2020   IR US GUIDE VASC ACCESS RIGHT  07/08/2019   LEFT HEART CATH AND CORONARY ANGIOGRAPHY N/A 06/12/2021   Procedure: LEFT HEART CATH AND CORONARY ANGIOGRAPHY;  Surgeon: Lyn Records, MD;  Location: [facility] INVASIVE CV LAB;  Service: Cardiovascular;  Laterality: N/A;   NO PAST SURGERIES     PERIPHERAL VASCULAR BALLOON ANGIOPLASTY Left 12/01/2022   Procedure: PERIPHERAL VASCULAR BALLOON ANGIOPLASTY;  Surgeon: Cephus Shelling, MD;  Location: [facility] INVASIVE CV LAB;  Service: Cardiovascular;  Laterality: Left;  Anterior tibial   PERIPHERAL VASCULAR INTERVENTION Left 12/01/2022   Procedure: PERIPHERAL VASCULAR INTERVENTION;  Surgeon: Cephus Shelling, MD;  Location: [facility] INVASIVE CV LAB;  Service: Cardiovascular;  Laterality: Left;  sfa   VASCULAR SURGERY     FAMILY HISTORY Family History  Problem Relation Age of Onset  Gout Mother    Asthma Mother    Diabetes Father    Heart failure Father    Diabetes Sister    Hypertension Brother    Pancreatic cancer Brother    Diabetes Sister    SOCIAL HISTORY Social History   Tobacco Use   Smoking status: Never    Passive exposure: Past   Smokeless tobacco: Never  Vaping Use   Vaping status: Never Used  Substance Use Topics   Alcohol use: Not Currently    Comment: haven't drank in over 5 years    Drug use: No       OPHTHALMIC EXAM:  Base Eye Exam     Visual Acuity (Snellen - Linear)       Right Left   Dist [place] 20/25 -2 20/20 -2  Forgot rx specs today MS        Tonometry (Tonopen, 9:51 AM)       Right Left   Pressure 16 14         Pupils       Pupils Dark Light Shape React APD   Right PERRL 2 1 Round Brisk None   Left PERRL 2 1 Round Brisk None         Visual Fields (Counting fingers)       Left Right    Full Full         Extraocular Movement       Right Left    Full, Ortho Full, Ortho         Neuro/Psych     Oriented x3: Yes   Mood/Affect: Normal         Dilation     Both  eyes: 1.0% Mydriacyl, 2.5% Phenylephrine @ 9:52 AM           Slit Lamp and Fundus Exam     External Exam       Right Left   External Normal Normal         Slit Lamp Exam       Right Left   Lids/Lashes Dermatochalasis - upper lid, mild MGD Dermatochalasis - upper lid   Conjunctiva/Sclera Mild Melanosis Mild Melanosis   Cornea Arcus, 1-2+ inferior Punctate epithelial erosions arcus, tear film debris   Anterior Chamber Deep and clear Deep and clear   Iris Round and moderately dilated, No NVI Round and moderately dilated, No NVI   Lens 2+ Nuclear sclerosis, 2-3+ Cortical cataract 2+ Nuclear sclerosis, 2-3+ Cortical cataract   Anterior Vitreous Vitreous syneresis Vitreous syneresis         Fundus Exam       Right Left   Disc Pink and sharp, mild PPA Pink and sharp, mild PPA   C/D Ratio 0.6 0.5   Macula Flat, good foveal reflex, central cystic changes -- improved, scattered MA/DBH -- improving, rare punctate exudates Flat, Blunted foveal reflex, central cystic changes -- improved, scattered MA/DBH, +focal laser targets, punctate exudates temporal to fovea   Vessels attenuated, Tortuous, mild AV crossing changes, copper wiring attenuated, Tortuous, AV crossing changes, Copper wiring   Periphery Attached, scattered MA/DBH, No edema Attached, scattered MA/DBH, No edema           IMAGING AND PROCEDURES  Imaging and Procedures for 10/05/2023  OCT, Retina - OU - Both Eyes       Right Eye Quality was good. Central Foveal Thickness: 267. Progression has improved. Findings include no SRF, abnormal foveal contour, intraretinal hyper-reflective material, intraretinal fluid, vitreomacular adhesion (Stable  improvement in central IRF inferior macula and fovea, stable improvement in SRF, scattered punctate IRHM - improved).   Left Eye Central Foveal Thickness: 283. Progression has improved. Findings include no SRF, abnormal foveal contour, intraretinal hyper-reflective material,  vitreomacular adhesion (Interval improvement in IRF/cystic changes IT fovea and macula).   Notes *Images captured and stored on drive  Diagnosis / Impression:  +DME OU (OD > OS) OD: Stable improvement in central IRF inferior macula and fovea, stable improvement in SRF, scattered punctate IRHM -- improved OS: Interval improvement in IRF/cystic changes IT fovea and macula  Clinical management:  See below  Abbreviations: NFP - Normal foveal profile. CME - cystoid macular edema. PED - pigment epithelial detachment. IRF - intraretinal fluid. SRF - subretinal fluid. EZ - ellipsoid zone. ERM - epiretinal membrane. ORA - outer retinal atrophy. ORT - outer retinal tubulation. SRHM - subretinal hyper-reflective material. IRHM - intraretinal hyper-reflective material           ASSESSMENT/PLAN:   ICD-10-CM   1. Severe nonproliferative diabetic retinopathy of both eyes with macular edema associated with type 2 diabetes mellitus (HCC)  E11.3413 OCT, Retina - OU - Both Eyes    2. Encounter for long-term (current) use of insulin (HCC)  Z79.4     3. Essential hypertension  I10     4. Hypertensive retinopathy of both eyes  H35.033     5. Cortical age-related cataract of both eyes  H25.013     6. Anxiety  F41.9      1,2. Severe Non-proliferative diabetic retinopathy, both eyes - last A1c 7.4 (12.23.23) - s/p IVA OD #1 (08.28.24), #2 (09.25.24), #3 (10.23.24) - FA (08.28.24) OD: Mild patches of capillary non perfusion peripherally, Late leaking MA, hyperfluorescence of disc, focal perifoveal hyperfluorescence leakage, no NV, OS: Mild patches of capillary non perfusion peripherally, Late leaking MA, no NV - BCVA OD stable at 20/25, OS 20/20 - stable - OCT shows OD: Stable improvement in central IRF inferior macula and fovea, stable improvement in SRF, scattered punctate IRHM -- improved; OS: Interval improvement in IRF/cystic changes IT fovea and macula - exam shows MAs OS -- improving -  recommend holding all treatment today (IVA OD and focal laser OS) due to improvements in exam and OCT - pt in agreement - f/u in 4 wks -- DFE/OCT, possible injection OD, possible focal laser OS   3,4. Hypertensive retinopathy OU - discussed importance of tight BP control - monitor   5.  Mixed Cataract OU - The symptoms of cataract, surgical options, and treatments and risks were discussed with patient. - discussed diagnosis and progression - monitor   6. Anxiety  - affecting ophthalmic examinations and treatments  - may benefit from mild anxiolytic prior to treatment visits (intravitreal injections and lasers)  - recommend discussing with primary care provider  Ophthalmic Meds Ordered this visit:  No orders of the defined types were placed in this encounter.    Return in about 4 weeks (around 11/02/2023) for f/u NPDR OU, DFE, OCT.  There are no Patient Instructions on file for this visit.  Explained the diagnoses, plan, and follow up with the patient and they expressed understanding.  Patient expressed understanding of the importance of proper follow up care.   This document serves as a record of services personally performed by Karie Chimera, MD, PhD. It was created on their behalf by Glee Arvin. Manson Passey, OA an ophthalmic technician. The creation of this record is the provider's dictation and/or activities during  the visit.    Electronically signed by: Glee Arvin. Manson Passey, OA 10/05/23 12:07 PM  Karie Chimera, M.D., Ph.D. Diseases & Surgery of the Retina and Vitreous [facility] 10/05/2023  I have reviewed the above documentation for accuracy and completeness, and I agree with the above. Karie Chimera, M.D., Ph.D. 10/05/23 12:14 PM   Abbreviations: M myopia (nearsighted); A astigmatism; H hyperopia (farsighted); P presbyopia; Mrx spectacle prescription;  CTL contact lenses; OD right eye; OS left eye; OU both eyes  XT exotropia; ET esotropia; PEK punctate  epithelial keratitis; PEE punctate epithelial erosions; DES dry eye syndrome; MGD meibomian gland dysfunction; ATs artificial tears; PFAT's preservative free artificial tears; NSC nuclear sclerotic cataract; PSC posterior subcapsular cataract; ERM epi-retinal membrane; PVD posterior vitreous detachment; RD retinal detachment; DM diabetes mellitus; DR diabetic retinopathy; NPDR non-proliferative diabetic retinopathy; PDR proliferative diabetic retinopathy; CSME clinically significant macular edema; DME diabetic macular edema; dbh dot blot hemorrhages; CWS cotton wool spot; POAG primary open angle glaucoma; C/D cup-to-disc ratio; HVF humphrey visual field; GVF goldmann visual field; OCT optical coherence tomography; IOP intraocular pressure; BRVO Branch retinal vein occlusion; CRVO central retinal vein occlusion; CRAO central retinal artery occlusion; BRAO branch retinal artery occlusion; RT retinal tear; SB scleral buckle; PPV pars plana vitrectomy; VH Vitreous hemorrhage; PRP panretinal laser photocoagulation; IVK intravitreal kenalog; VMT vitreomacular traction; MH Macular hole;  NVD neovascularization of the disc; NVE neovascularization elsewhere; AREDS age related eye disease study; ARMD age related macular degeneration; POAG primary open angle glaucoma; EBMD epithelial/anterior basement membrane dystrophy; ACIOL anterior chamber intraocular lens; IOL intraocular lens; PCIOL posterior chamber intraocular lens; Phaco/IOL phacoemulsification with intraocular lens placement; PRK photorefractive keratectomy; LASIK laser assisted in situ keratomileusis; HTN hypertension; DM diabetes mellitus; COPD chronic obstructive pulmonary disease

## 2023-09-29 NOTE — ED Notes (Signed)
Pt had [name] off, stated "I only put it on when I feel like I need it" SpO2 98RA, O2 turned on to 2L March ARB and placed on bed beside pt. Pt confirmed "If I need it I will put it on."

## 2023-09-29 NOTE — Progress Notes (Signed)
Consult to HF Navigation Team Placed. Unfortunately, this patient does not meet criteria given ESRD on HD and GDMT is limited. Please feel free to reach out with any questions or medication assistance needs.  Thank you for involving the HF Navigation Team in this patient's care.  Enos Fling, PharmD, BCPS Clinical Pharmacist 06/04/2023 12:50 PM

## 2023-09-29 NOTE — ED Notes (Signed)
Bgl 446, Jawo,  NP notified, will give 15units insulin that pt missed due to dialysis and recheck and cover additional at midnight.

## 2023-09-30 DIAGNOSIS — E8770 Fluid overload, unspecified: Secondary | ICD-10-CM | POA: Diagnosis not present

## 2023-09-30 LAB — GLUCOSE, CAPILLARY: Glucose-Capillary: 395 mg/dL — ABNORMAL HIGH (ref 70–99)

## 2023-09-30 LAB — BASIC METABOLIC PANEL
Anion gap: 14 (ref 5–15)
BUN: 65 mg/dL — ABNORMAL HIGH (ref 6–20)
CO2: 23 mmol/L (ref 22–32)
Calcium: 8.3 mg/dL — ABNORMAL LOW (ref 8.9–10.3)
Chloride: 95 mmol/L — ABNORMAL LOW (ref 98–111)
Creatinine, Ser: 9.95 mg/dL — ABNORMAL HIGH (ref 0.61–1.24)
GFR, Estimated: 6 mL/min — ABNORMAL LOW (ref >60)
Glucose, Bld: 277 mg/dL — ABNORMAL HIGH (ref 70–99)
Potassium: 4.7 mmol/L (ref 3.5–5.1)
Sodium: 132 mmol/L — ABNORMAL LOW (ref 135–145)

## 2023-09-30 LAB — CBC
HCT: 27.6 % — ABNORMAL LOW (ref 39.0–52.0)
Hemoglobin: 8.9 g/dL — ABNORMAL LOW (ref 13.0–17.0)
MCH: 28.9 pg (ref 26.0–34.0)
MCHC: 32.2 g/dL (ref 30.0–36.0)
MCV: 89.6 fL (ref 80.0–100.0)
Platelets: 305 10*3/uL (ref 150–400)
RBC: 3.08 MIL/uL — ABNORMAL LOW (ref 4.22–5.81)
RDW: 15.9 % — ABNORMAL HIGH (ref 11.5–15.5)
WBC: 10.6 10*3/uL — ABNORMAL HIGH (ref 4.0–10.5)
nRBC: 0 % (ref 0.0–0.2)

## 2023-09-30 MED ORDER — PREDNISONE 50 MG PO TABS: 50.0000 mg | ORAL_TABLET | Freq: Every day | ORAL | 0 refills | Status: AC

## 2023-09-30 MED ORDER — IPRATROPIUM-ALBUTEROL 20-100 MCG/ACT IN AERS: 1.0000 | INHALATION_SPRAY | Freq: Four times a day (QID) | RESPIRATORY_TRACT | 2 refills | Status: AC | PRN

## 2023-09-30 MED ORDER — FERROUS SULFATE 325 (65 FE) MG PO TABS: 325.0000 mg | ORAL_TABLET | Freq: Two times a day (BID) | ORAL | 2 refills | Status: DC

## 2023-09-30 MED ORDER — CARVEDILOL 25 MG PO TABS
25.0000 mg | ORAL_TABLET | Freq: Two times a day (BID) | ORAL | Status: DC
  Administered 2023-09-30: 25 mg via ORAL
  Filled 2023-09-30: qty 1

## 2023-09-30 NOTE — Progress Notes (Signed)
This RN provided discharge instructions to the patient. The patient verbalized and demonstrated understanding of the provided instructions. All outstanding questions resolved. L arm PIV removed. Cannula intact. Pt tolerated well. All belongings packed and in tow.

## 2023-09-30 NOTE — Care Management CC44 (Signed)
Condition Code 44 Documentation Completed  Patient Details  Name: Kelly Key MRN: [id number] Date of Birth: [birth year]   Condition Code 44 given:  Yes Patient signature on Condition Code 44 notice:  Yes Documentation of 2 MD's agreement:  Yes Code 44 added to claim:  Yes    Truddie Hidden, RN 09/30/2023, 3:57 PM

## 2023-09-30 NOTE — Inpatient Diabetes Management (Signed)
Inpatient Diabetes Program Recommendations  AACE/ADA: New Consensus Statement on Inpatient Glycemic Control   Target Ranges:  Prepandial:   less than 140 mg/dL      Peak postprandial:   less than 180 mg/dL (1-2 hours)      Critically ill patients:  140 - 180 mg/dL    Latest Reference Range & Units 09/30/23 08:18  Glucose 70 - 99 mg/dL 272 (H)    Latest Reference Range & Units 09/29/23 12:34 09/29/23 20:55 09/29/23 21:43 09/29/23 22:52  Glucose-Capillary 70 - 99 mg/dL 536 (H) 644 (H) 034 (H) 409 (H)   Review of Glycemic Control  Diabetes history: DM2 Outpatient Diabetes medications: Levemir 50 units at bedtime, Humalog 8-30 units TID with meals Current orders for Inpatient glycemic control: Levemir 15 units at bedtime, Novolog 0-15 units TID with meals, Novolog 0-5 units at bedtime; Prednisone 50 mg QAM  Inpatient Diabetes Program Recommendations:    Insulin: Please consider increasing Levemir to 25 units at bedtime and ordering Novolog 6 units TID with meals for meal coverage if patient eats at least 50% of meals.  Thanks, Orlando Penner, RN, MSN, CDCES Diabetes Coordinator Inpatient Diabetes Program [phone] (Team Pager from 8am to 5pm)

## 2023-09-30 NOTE — Discharge Summary (Signed)
[facility] Discharge Summary   Patient: Kelly Key SAY:[id number]  PCP: Pearson Grippe, MD  Date of admission: 09/29/2023   Date of discharge:  09/30/2023     Discharge Diagnoses:  Principal Problem:   Fluid overload Active Problems:   ESRD (end stage renal disease) (HCC)   CHF (congestive heart failure) (HCC)   Admitted From: Home Disposition:  Home   Recommendations for Outpatient Follow-up:  Follow-up with PCP in 1 week, continue monitor BP at home and follow with PCP to titrate medication accordingly. Follow with nephrology and continue hemodialysis TTS schedule. Repeat iron profile after 3 months Follow-up with pulmonologist for PFTs Follow up LABS/TEST:  as above   Follow-up Information     Pearson Grippe, MD Follow up in 1 week(s).   Specialty: Internal Medicine Contact information: [address] [place] [address] [phone]                Diet recommendation: Cardiac and Carb modified diet  Activity: The patient is advised to gradually reintroduce usual activities, as tolerated  Discharge Condition: stable  Code Status: Full code   History of present illness: As per the H and P dictated on admission Hospital Course:  Kelly Key is a 52 y.o. male with medical history significant of ESRD on HD TTS, chronic HFpEF, refractory HTN, COPD, morbid obesity, HLD, IDDM with resistance presented with new onset of wheezing and shortness of breath.   Symptoms started 2 days ago, patient started to have wheezing and shortness of breath.  Dry cough no fever or chills, he also developed right-sided pleural chest pain, sharp like worsening with cough and deep breath.  Has been using his Combivent around-the-clock with minimal improvement.  His last dialysis was last Saturday. ED Course: 87% on room air, afebrile, nontachycardic blood pressure elevated SBP 117-180 range, O2 saturation 4 L and 99% on room air.  Checks x-ray showed mild pulmonary  congestion.  D-dimer elevated and CT angiogram negative for PE but pulmonary congestion.   Assessment/Plan: # Acute hypoxic respiratory failure, Resolved -Multifactorial, likely secondary to a combined effect from acute COPD exacerbation as well as acute on chronic HFpEF decompensation Hypoxia resolved, currently patient is saturating well on room air # Acute on chronic HFpEF decompensation Nephrology consulted, patient received hemodialysis on 12/3 and 12/4 Resume home BP meds.  TTE was done recently, will not repeat echo on this admission.  Follow-up with cardiology as per schedule. # Acute COPD exacerbation: Short course of p.o. prednisone, patient was given prednisone 40 mg p.o. daily, breathing improved but he still has mild wheezing, prescribed prednisone 40 mg p.o. daily for 3 days on discharge.  Resumed home nebulizer and albuterol.  Advised to follow with PCP in pulmonologist as an outpatient for PFTs. # Refractory HTN, uncontrolled Resume home BP meds including clonidine, p.o. Lasix, amlodipine, Coreg, Blood pressure improved.  Patient was advised to monitor BP at home and follow with PCP. # IDDM with insulin resistance: Resumed home dose insulin, advised to monitor CBG at home and continue diabetic diet. # Chronic iron deficiency anemia -Low iron study 6 months ago, start iron supplement, outpatient follow-up with GI for colonoscopy. # ESRD on HD Patient presented with volume overload, pulmonary edema and shortness of breath. Nephrology consulted and patient was hemodialyzed on 12/3 and 12/4, breathing improved.  Currently saturating well on room air.  Patient was cleared by nephrology to discharge home and continue hemodialysis as per schedule.  Patient agreed  with the discharge planning. # Morbid obesity Body mass index is 50.65 kg/m.  Nutrition Interventions:  - Patient was instructed, not to drive, operate heavy machinery, perform activities at heights, swimming or  participation in water activities or provide baby sitting services while on Pain, Sleep and Anxiety Medications; until his outpatient Physician has advised to do so again.  - Also recommended to not to take more than prescribed Pain, Sleep and Anxiety Medications.  Patient was ambulatory without any assistance. On the day of the discharge the patient's vitals were stable, and no other acute medical condition were reported by patient. the patient was felt safe to be discharge at Home.  Consultants: Nephrology Procedures: Hemodialysis  Discharge Exam: General: Appear in no distress, no Rash; Oral Mucosa Clear, moist. Cardiovascular: S1 and S2 Present, no Murmur, Respiratory: normal respiratory effort, Bilateral Air entry present and no Crackles, mild wheezing bilaterally Abdomen: Bowel Sound present, Soft and no tenderness Extremities: no Pedal edema, no calf tenderness Neurology: alert and oriented to time, place, and person affect appropriate.  Filed Weights   09/30/23 0343 09/30/23 0804 09/30/23 1245  Weight: (!) 149.8 kg (!) 150.2 kg (!) 146.7 kg   Vitals:   09/30/23 1230 09/30/23 1245  BP: (!) 176/104 (!) 174/99  Pulse:  95  Resp: 15 17  Temp:  97.8 F (36.6 C)  SpO2:  95%    DISCHARGE MEDICATION: Allergies as of 09/30/2023       Reactions   Ozempic (0.25 Or 0.5 Mg-dose) [semaglutide(0.25 Or 0.5mg -dos)] Shortness Of Breath   Hydralazine Anxiety   Hallucinations    Lisinopril Swelling        Medication List     TAKE these medications    Accu-Chek Aviva Plus test strip Generic drug: glucose blood Use as instructed TO CHECK BLOOD GLUCOSE THREE TIMES DAILY   acetaminophen 500 MG tablet Commonly known as: TYLENOL Take 1,000 mg by mouth every 6 (six) hours as needed for moderate pain or headache.   albuterol 108 (90 Base) MCG/ACT inhaler Commonly known as: VENTOLIN HFA Inhale 2 puffs into the lungs every 4 (four) hours as needed for wheezing or shortness of  breath.   allopurinol 100 MG tablet Commonly known as: ZYLOPRIM Take 2 tablets (200 mg total) by mouth daily.   AMBULATORY NON FORMULARY MEDICATION Inject 0.2 ml by intracavernosal route as directed.  Medication Name: TriMix PGE 30 mcg Pap   30 mg Phent 1 mg   amLODipine 5 MG tablet Commonly known as: NORVASC Take 5 mg by mouth daily.   aspirin EC 81 MG tablet Take 1 tablet (81 mg total) by mouth daily.   atorvastatin 80 MG tablet Commonly known as: LIPITOR Take 1 tablet (80 mg total) by mouth daily.   B-D ULTRAFINE III SHORT PEN 31G X 8 MM Misc Generic drug: Insulin Pen Needle 1 each by Does not apply route as directed.   carvedilol 25 MG tablet Commonly known as: COREG Take 1 tablet (25 mg total) by mouth 2 (two) times daily with a meal.   cloNIDine 0.3 MG tablet Commonly known as: CATAPRES Take 1 tablet (0.3 mg total) by mouth 3 (three) times daily.   clopidogrel 75 MG tablet Commonly known as: PLAVIX Take 75 mg by mouth daily.   esomeprazole 20 MG capsule Commonly known as: NEXIUM Take 20 mg by mouth daily.   ezetimibe 10 MG tablet Commonly known as: ZETIA Take 10 mg by mouth every evening.   ferrous sulfate 325 (65  FE) MG tablet Take 1 tablet (325 mg total) by mouth 2 (two) times daily with a meal.   fluticasone 50 MCG/ACT nasal spray Commonly known as: FLONASE Place 1 spray into both nostrils daily as needed for allergies or rhinitis.   furosemide 80 MG tablet Commonly known as: LASIX Take 80 mg by mouth daily.   gabapentin 100 MG capsule Commonly known as: NEURONTIN Take 1 capsule (100 mg total) by mouth 3 (three) times daily.   HumaLOG KwikPen 100 UNIT/ML KwikPen Generic drug: insulin lispro Inject 8-30 Units into the skin 3 (three) times daily. Sliding scale 30 units max TID   Ipratropium-Albuterol 20-100 MCG/ACT Aers respimat Commonly known as: COMBIVENT Inhale 1 puff into the lungs every 6 (six) hours as needed for wheezing or shortness  of breath.   Levemir FlexTouch 100 UNIT/ML FlexTouch Pen Generic drug: insulin detemir Inject 50 Units into the skin at bedtime.   Linzess 145 MCG Caps capsule Generic drug: linaclotide Take 145 mcg by mouth daily before breakfast.   multivitamin Tabs tablet Take 1 tablet by mouth at bedtime.   predniSONE 50 MG tablet Commonly known as: DELTASONE Take 1 tablet (50 mg total) by mouth daily with breakfast for 3 days.   sevelamer carbonate 800 MG tablet Commonly known as: RENVELA Take 3 tablets (2,400 mg total) by mouth 3 (three) times daily with meals. What changed: additional instructions   Velphoro 500 MG chewable tablet Generic drug: sucroferric oxyhydroxide Chew 500 mg by mouth 3 (three) times daily.       Allergies  Allergen Reactions   Ozempic (0.25 Or 0.5 Mg-Dose) [Semaglutide(0.25 Or 0.5mg -Dos)] Shortness Of Breath   Hydralazine Anxiety    Hallucinations    Lisinopril Swelling   Discharge Instructions     Call MD for:  difficulty breathing, headache or visual disturbances   Complete by: As directed    Call MD for:  extreme fatigue   Complete by: As directed    Call MD for:  persistant dizziness or light-headedness   Complete by: As directed    Call MD for:  persistant nausea and vomiting   Complete by: As directed    Call MD for:  severe uncontrolled pain   Complete by: As directed    Call MD for:  temperature >100.4   Complete by: As directed    Diet - low sodium heart healthy   Complete by: As directed    Discharge instructions   Complete by: As directed    Follow-up with PCP in 1 week, continue monitor BP at home and follow with PCP to titrate medication accordingly. Follow with nephrology and continue hemodialysis TTS schedule. Repeat iron profile after 3 months Follow-up with pulmonologist for PFTs   Increase activity slowly   Complete by: As directed        The results of significant diagnostics from this hospitalization (including imaging,  microbiology, ancillary and laboratory) are listed below for reference.    Significant Diagnostic Studies: CT Angio Chest PE W and/or Wo Contrast  Result Date: 09/29/2023 CLINICAL DATA:  52 year old male with right side pain and shortness of breath. Abnormal D-dimer. EXAM: CT ANGIOGRAPHY CHEST WITH CONTRAST TECHNIQUE: Multidetector CT imaging of the chest was performed using the standard protocol during bolus administration of intravenous contrast. Multiplanar CT image reconstructions and MIPs were obtained to evaluate the vascular anatomy. RADIATION DOSE REDUCTION: This exam was performed according to the departmental dose-optimization program which includes automated exposure control, adjustment of the mA and/or kV  according to patient size and/or use of iterative reconstruction technique. CONTRAST:  OMNIPAQUE IOHEXOL 350 MG/ML SOLN COMPARISON:  Portable chest 0133 hours today. FINDINGS: Cardiovascular: Adequate contrast bolus timing in the pulmonary arterial tree. Mild respiratory motion. No central, saddle, lobar pulmonary embolus. No convincing pulmonary artery filling defect. Calcified coronary artery atherosclerosis. Cardiomegaly. No pericardial effusion. Calcified aortic atherosclerosis. Negative for thoracic aortic aneurysm or dissection. Mediastinum/Nodes: Negative. Subcentimeter mediastinal lymph nodes are at the upper limits of normal. Lungs/Pleura: Atelectatic changes to the major airways. Difficult to exclude superimposed middle and lower lobe circumferential bronchial wall thickening such as on series 5, image 67. And this continues distally in the lower lobes. Major airways remain patent. Mosaic attenuation with some superimposed pulmonary septal thickening bilaterally. Superimposed symmetric dependent lung opacity. No consolidation. No convincing pleural effusion. Upper Abdomen: Negative visible liver, gallbladder, spleen, pancreas, adrenal glands, and bowel in the upper abdomen. Compared  to 04/20/2021 CT Abdomen and Pelvis partially visible renal volume loss not significantly changed. Musculoskeletal: Thoracic spine endplate degeneration with flowing endplate osteophytes. No acute or suspicious osseous lesion. Review of the MIP images confirms the above findings. IMPRESSION: 1. No pulmonary embolus identified. 2. Nonspecific pulmonary septal and airway thickening with mosaic attenuation and atelectasis. Top differential considerations include pulmonary edema, viral/atypical airway or respiratory infection. No pleural effusion. 3. Cardiomegaly with calcified coronary artery atherosclerosis. Aortic Atherosclerosis (ICD10-I70.0). Electronically Signed   By: Odessa Fleming M.D.   On: 09/29/2023 07:11   DG Chest Port 1 View  Result Date: 09/29/2023 CLINICAL DATA:  Shortness of breath and right-sided pain. EXAM: PORTABLE CHEST 1 VIEW COMPARISON:  September 22, 2023 FINDINGS: The heart size and mediastinal contours are within normal limits. There is mild to moderate severity prominence of the central pulmonary vasculature. Mild atelectatic changes are noted within the bilateral lung bases. No pleural effusion or pneumothorax is identified. Multilevel degenerative changes seen throughout the thoracic spine. IMPRESSION: 1. Mild to moderate severity pulmonary vascular congestion. 2. Mild bibasilar atelectasis. Electronically Signed   By: Aram Candela M.D.   On: 09/29/2023 04:00   DG Chest Portable 1 View  Result Date: 09/22/2023 CLINICAL DATA:  Volume overload.  Shortness of breath. EXAM: PORTABLE CHEST 1 VIEW COMPARISON:  Chest radiograph dated 09/22/2023 FINDINGS: With there is cardiomegaly with mild central vascular congestion. No focal consolidation, pleural effusion or pneumothorax. No acute osseous pathology. IMPRESSION: Cardiomegaly with mild central vascular congestion. Electronically Signed   By: Elgie Collard M.D.   On: 09/22/2023 19:29   DG Chest Port 1 View  Result Date:  09/22/2023 CLINICAL DATA:  Shortness of breath EXAM: PORTABLE CHEST 1 VIEW COMPARISON:  09/14/2023 FINDINGS: Cardiac enlargement. Aortic and hilar contours are stable. There is no edema, consolidation, effusion, or pneumothorax. IMPRESSION: No acute finding when compared to prior. Electronically Signed   By: Tiburcio Pea M.D.   On: 09/22/2023 07:01   OCT, Retina - OU - Both Eyes  Result Date: 09/21/2023 Right Eye Quality was good. Central Foveal Thickness: 266. Progression has improved. Findings include no SRF, abnormal foveal contour, intraretinal hyper-reflective material, intraretinal fluid, vitreomacular adhesion (Stable improvement in central IRF inferior macula and fovea, stable improvement in SRF, scattered punctate IRHM - improved). Left Eye Central Foveal Thickness: 284. Progression has been stable. Findings include no SRF, abnormal foveal contour, intraretinal hyper-reflective material, vitreomacular adhesion ( persistent IRF/cystic changes IT fovea and macula). Notes *Images captured and stored on drive Diagnosis / Impression: +DME OU (OD > OS) OD: Stable improvement  in central IRF inferior macula and fovea, stable improvement in SRF, scattered punctate IRHM -- improved OS: persistent IRF/cystic changes IT fovea and macula Clinical management: See below Abbreviations: NFP - Normal foveal profile. CME - cystoid macular edema. PED - pigment epithelial detachment. IRF - intraretinal fluid. SRF - subretinal fluid. EZ - ellipsoid zone. ERM - epiretinal membrane. ORA - outer retinal atrophy. ORT - outer retinal tubulation. SRHM - subretinal hyper-reflective material. IRHM - intraretinal hyper-reflective material   DG Chest Portable 1 View  Result Date: 09/14/2023 CLINICAL DATA:  Shortness of breath since 1 a.m. EXAM: PORTABLE CHEST 1 VIEW COMPARISON:  06/08/2023 FINDINGS: The heart size and mediastinal contours are within normal limits. Both lungs are clear. The visualized skeletal structures are  unremarkable. IMPRESSION: No active disease. Electronically Signed   By: Signa Kell M.D.   On: 09/14/2023 06:51   VAS [place] LOWER EXTREMITY ARTERIAL DUPLEX  Result Date: 09/02/2023 LOWER EXTREMITY ARTERIAL DUPLEX STUDY Patient Name:  MACGYVER CHAMPEAU  Date of Exam:   09/02/2023 Medical Rec #: [id number]     Accession #:    [id number] Date of Birth: 1971/10/17      Patient Gender: M Patient Age:   54 years Exam Location:  [facility] Procedure:      VAS [place] LOWER EXTREMITY ARTERIAL DUPLEX Referring Phys: Sherald Hess --------------------------------------------------------------------------------  Indications: Peripheral artery disease. High Risk Factors: Hypertension, hyperlipidemia, Diabetes.  Vascular Interventions: 12/01/2022- Angioplasty and stent of left SFA (7 mm x 120                         mm drug-coated Eluvia postdilated with a 6 mm Mustang).                         Left anterior tibial artery angioplasty (3 mm x 220 mm                         [place]). Current ABI:            right=1.07/0.92, left=1.22/0.87 Comparison Study: 01/06/23 Left lower extremity arterial duplex- Left: Patent                   stent with no evidence of stenosis in the superficial femoral                   artery. Performing Technologist: Gertie Fey MHA, RDMS, RVT, RDCS  Examination Guidelines: A complete evaluation includes B-mode imaging, spectral Doppler, color Doppler, and power Doppler as needed of all accessible portions of each vessel. Bilateral testing is considered an integral part of a complete examination. Limited examinations for reoccurring indications may be performed as noted.   +-----------+--------+-----+--------+---------+------------------+ LEFT       PSV cm/sRatioStenosisWaveform Comments           +-----------+--------+-----+--------+---------+------------------+ CFA Distal 108                  triphasic                    +-----------+--------+-----+--------+---------+------------------+ DFA        71                   triphasic                   +-----------+--------+-----+--------+---------+------------------+ SFA Prox   102  triphasic                   +-----------+--------+-----+--------+---------+------------------+ POP Prox   53                   biphasic                    +-----------+--------+-----+--------+---------+------------------+ POP Distal 52                   biphasic                    +-----------+--------+-----+--------+---------+------------------+ TP Trunk   54                   triphasic                   +-----------+--------+-----+--------+---------+------------------+ ATA Distal 67                   biphasic                    +-----------+--------+-----+--------+---------+------------------+ PTA Prox   93                   biphasic                    +-----------+--------+-----+--------+---------+------------------+ PTA Mid    97                   biphasic                    +-----------+--------+-----+--------+---------+------------------+ PTA Distal 89                   biphasic                    +-----------+--------+-----+--------+---------+------------------+ PERO Prox                                Unable to insonate +-----------+--------+-----+--------+---------+------------------+ PERO Mid                                 Unable to insonate +-----------+--------+-----+--------+---------+------------------+ PERO Distal                              Unable to insonate +-----------+--------+-----+--------+---------+------------------+  Left Stent(s): +---------------+--------+--------+---------+--------+ SFA            PSV cm/sStenosisWaveform Comments +---------------+--------+--------+---------+--------+ Prox to Stent  103             triphasic          +---------------+--------+--------+---------+--------+ Proximal Stent 99              triphasic         +---------------+--------+--------+---------+--------+ Mid Stent      120             triphasic         +---------------+--------+--------+---------+--------+ Distal Stent   109             triphasic         +---------------+--------+--------+---------+--------+ Distal to Stent102             triphasic         +---------------+--------+--------+---------+--------+    Summary: Left: Patent stent with no evidence of stenosis  in the superficial femoral artery.  See table(s) above for measurements and observations. Electronically signed by Lemar Livings MD on 09/02/2023 at 4:27:56 PM.    Final    VAS [place] ABI WITH/WO TBI  Result Date: 09/02/2023  LOWER EXTREMITY DOPPLER STUDY Patient Name:  ISAI PETRICCA  Date of Exam:   09/02/2023 Medical Rec #: [id number]     Accession #:    [id number] Date of Birth: Oct 01, 1971      Patient Gender: M Patient Age:   49 years Exam Location:  [facility] Procedure:      VAS [place] ABI WITH/WO TBI Referring Phys: Sherald Hess --------------------------------------------------------------------------------  Indications: Peripheral artery disease. High Risk Factors: Hypertension, hyperlipidemia, Diabetes.  Vascular Interventions: 12/01/2022- Angioplasty and stent of left SFA (7 mm x 120                         mm drug-coated Eluvia postdilated with a 6 mm Mustang).                         Left anterior tibial artery angioplasty (3 mm x 220 mm                         [place]). Comparison Study: 01/06/23 ABI/TBI- right=1.05/0.70, left=0.98/0.66 Performing Technologist: Gertie Fey MHA, RVT, RDCS, RDMS  Examination Guidelines: A complete evaluation includes at minimum, Doppler waveform signals and systolic blood pressure reading at the level of bilateral brachial, anterior tibial, and posterior tibial arteries, when vessel segments are accessible.  Bilateral testing is considered an integral part of a complete examination. Photoelectric Plethysmograph (PPG) waveforms and toe systolic pressure readings are included as required and additional duplex testing as needed. Limited examinations for reoccurring indications may be performed as noted.  ABI Findings: +---------+------------------+-----+---------+--------+ Right    Rt Pressure (mmHg)IndexWaveform Comment  +---------+------------------+-----+---------+--------+ Brachial 170                                      +---------+------------------+-----+---------+--------+ ATA      163               0.96 triphasic         +---------+------------------+-----+---------+--------+ PTA      182               1.07 triphasic         +---------+------------------+-----+---------+--------+ Great Toe156               0.92                   +---------+------------------+-----+---------+--------+ +---------+------------------+-----+---------+--------------------+ Left     Lt Pressure (mmHg)IndexWaveform Comment              +---------+------------------+-----+---------+--------------------+ Brachial                                 Restricted extremity +---------+------------------+-----+---------+--------------------+ ATA      163               0.96 biphasic                      +---------+------------------+-----+---------+--------------------+ PTA      208               1.22  triphasic                     +---------+------------------+-----+---------+--------------------+ Great Toe148               0.87                               +---------+------------------+-----+---------+--------------------+ +-------+-----------+-----------+------------+------------+ ABI/TBIToday's ABIToday's TBIPrevious ABIPrevious TBI +-------+-----------+-----------+------------+------------+ Right  1.07       0.92       1.05        0.70          +-------+-----------+-----------+------------+------------+ Left   1.22       0.87       0.98        0.66         +-------+-----------+-----------+------------+------------+  Bilateral ABIs and TBIs appear increased compared to prior study on 01/06/23.  Summary: Right: Resting right ankle-brachial index is within normal range. The right toe-brachial index is normal. Left: Resting left ankle-brachial index is within normal range. The left toe-brachial index is normal. *See table(s) above for measurements and observations.  Electronically signed by Lemar Livings MD on 09/02/2023 at 3:25:03 PM.    Final     Microbiology: No results found for this or any previous visit (from the past 240 hour(s)).   Labs: CBC: Recent Labs  Lab 09/29/23 0104 09/30/23 0837  WBC 10.7* 10.6*  NEUTROABS 7.1  --   HGB 9.8* 8.9*  HCT 31.1* 27.6*  MCV 90.9 89.6  PLT 316 305   Basic Metabolic Panel: Recent Labs  Lab 09/29/23 0104 09/30/23 0818  NA 135 132*  K 4.4 4.7  CL 95* 95*  CO2 26 23  GLUCOSE 117* 277*  BUN 79* 65*  CREATININE 11.57* 9.95*  CALCIUM 8.1* 8.3*   Liver Function Tests: Recent Labs  Lab 09/29/23 0104  AST 21  ALT 32  ALKPHOS 70  BILITOT 0.6  PROT 7.8  ALBUMIN 3.8   No results for input(s): "LIPASE", "AMYLASE" in the last 168 hours. No results for input(s): "AMMONIA" in the last 168 hours. Cardiac Enzymes: No results for input(s): "CKTOTAL", "CKMB", "CKMBINDEX", "TROPONINI" in the last 168 hours. BNP (last 3 results) Recent Labs    09/14/23 0737 09/22/23 0649 09/29/23 0104  BNP 434.0* 548.7* 631.4*   CBG: Recent Labs  Lab 09/23/23 1722 09/29/23 1234 09/29/23 2055 09/29/23 2143 09/29/23 2252  GLUCAP 271* 258* 446* 465* 409*    Time spent: 35 minutes  Signed:  Gillis Santa  [facility] 09/30/2023 2:28 PM

## 2023-09-30 NOTE — Plan of Care (Signed)
Progressing towards goals

## 2023-09-30 NOTE — Progress Notes (Signed)
Hemodialysis note  Received patient in bed to unit. Alert and oriented.  Informed consent signed and in chart.  Treatment initiated: 0830 Treatment completed: 1245  Patient tolerated well. Transported back to room, alert without acute distress.  Report given to patient's RN.   Access used: LUA AVF Access issues: none  Total UF removed: 3500 ml (patient's goal was 5L but UF was paused due to severe cramps and a total of 400 ml NS bolus was given). Medication(s) given:  none  Post HD weight: 146.7 kg   [facility]

## 2023-09-30 NOTE — Progress Notes (Signed)
[facility]  ROUNDING NOTE   Subjective:   Kelly Key is a 52 y.o. male with past medical history of COPD, diabetes, gout, HLP, Hypertension and end stage renal disease on hemodialysis. He presents to ED with shortness of breath.  Patient has been admitted for CHF (congestive heart failure) (HCC) [I50.9] Acute respiratory failure with hypoxia (HCC) [J96.01] Pulmonary vascular congestion [R09.89] Fluid overload [E87.70] Right-sided chest pain [R07.9]   He is known to our practice and receives outpatient dialysis treatments at [facility] on a TTS schedule.   Patient seen and evaluated during dialysis   HEMODIALYSIS FLOWSHEET:  Blood Flow Rate (mL/min): 349 mL/min Arterial Pressure (mmHg): -157.57 mmHg Venous Pressure (mmHg): 222.01 mmHg TMP (mmHg): 36.16 mmHg Ultrafiltration Rate (mL/min): 1653 mL/min Dialysate Flow Rate (mL/min): 299 ml/min  Resting well during treatment Appears more comfortable Remains on 2L [place]   Objective:  Vital signs in last 24 hours:  Temp:  [97.6 F (36.4 C)-98.7 F (37.1 C)] 97.8 F (36.6 C) (12/04 0804) Pulse Rate:  [86-106] 91 (12/04 1100) Resp:  [0-24] 0 (12/04 1100) BP: (121-199)/(84-118) 156/103 (12/04 1100) SpO2:  [90 %-100 %] 100 % (12/04 1100) Weight:  [149.8 kg-154.9 kg] 150.2 kg (12/04 0804)  Weight change: 9.749 kg Filed Weights   09/29/23 1746 09/30/23 0343 09/30/23 0804  Weight: (!) 150.2 kg (!) 149.8 kg (!) 150.2 kg    Intake/Output: I/O last 3 completed shifts: In: -  Out: 5250 [Urine:750; Other:4500]   Intake/Output this shift:  No intake/output data recorded.  Physical Exam: General: NAD  Head: Normocephalic, atraumatic. Moist oral mucosal membranes  Eyes: Anicteric  Lungs:  Diminished, mild wheeze, NCO 2  Heart: Regular rate and rhythm  Abdomen:  Soft, nontender, obese  Extremities: Nonpitting peripheral edema.  Neurologic: Alert and oriented, moving all four extremities  Skin: No lesions   Access: Left upper aVF    Basic Metabolic Panel: Recent Labs  Lab 09/29/23 0104 09/30/23 0818  NA 135 132*  K 4.4 4.7  CL 95* 95*  CO2 26 23  GLUCOSE 117* 277*  BUN 79* 65*  CREATININE 11.57* 9.95*  CALCIUM 8.1* 8.3*    Liver Function Tests: Recent Labs  Lab 09/29/23 0104  AST 21  ALT 32  ALKPHOS 70  BILITOT 0.6  PROT 7.8  ALBUMIN 3.8   No results for input(s): "LIPASE", "AMYLASE" in the last 168 hours. No results for input(s): "AMMONIA" in the last 168 hours.  CBC: Recent Labs  Lab 09/29/23 0104 09/30/23 0837  WBC 10.7* 10.6*  NEUTROABS 7.1  --   HGB 9.8* 8.9*  HCT 31.1* 27.6*  MCV 90.9 89.6  PLT 316 305    Cardiac Enzymes: No results for input(s): "CKTOTAL", "CKMB", "CKMBINDEX", "TROPONINI" in the last 168 hours.  BNP: Invalid input(s): "POCBNP"  CBG: Recent Labs  Lab 09/23/23 1722 09/29/23 1234 09/29/23 2055 09/29/23 2143 09/29/23 2252  GLUCAP 271* 258* 446* 465* 409*    Microbiology: Results for orders placed or performed during the hospital encounter of 09/14/23  Resp panel by RT-PCR (RSV, Flu A&B, Covid) Anterior Nasal Swab     Status: None   Collection Time: 09/14/23  7:37 AM   Specimen: Anterior Nasal Swab  Result Value Ref Range Status   SARS Coronavirus 2 by RT PCR NEGATIVE NEGATIVE Final    Comment: (NOTE) SARS-CoV-2 target nucleic acids are NOT DETECTED.  The SARS-CoV-2 RNA is generally detectable in upper respiratory specimens during the acute phase of infection. The  lowest concentration of SARS-CoV-2 viral copies this assay can detect is 138 copies/mL. A negative result does not preclude SARS-Cov-2 infection and should not be used as the sole basis for treatment or other patient management decisions. A negative result may occur with  improper specimen collection/handling, submission of specimen other than nasopharyngeal swab, presence of viral mutation(s) within the areas targeted by this assay, and inadequate number of  viral copies(<138 copies/mL). A negative result must be combined with clinical observations, patient history, and epidemiological information. The expected result is Negative.  Fact Sheet for Patients:  BloggerCourse.com  Fact Sheet for Healthcare Providers:  SeriousBroker.it  This test is no t yet approved or cleared by the [place] FDA and  has been authorized for detection and/or diagnosis of SARS-CoV-2 by FDA under an Emergency Use Authorization (EUA). This EUA will remain  in effect (meaning this test can be used) for the duration of the COVID-19 declaration under Section 564(b)(1) of the Act, 21 U.S.C.section 360bbb-3(b)(1), unless the authorization is terminated  or revoked sooner.       Influenza A by PCR NEGATIVE NEGATIVE Final   Influenza B by PCR NEGATIVE NEGATIVE Final    Comment: (NOTE) The Xpert Xpress SARS-CoV-2/FLU/RSV plus assay is intended as an aid in the diagnosis of influenza from Nasopharyngeal swab specimens and should not be used as a sole basis for treatment. Nasal washings and aspirates are unacceptable for Xpert Xpress SARS-CoV-2/FLU/RSV testing.  Fact Sheet for Patients: BloggerCourse.com  Fact Sheet for Healthcare Providers: SeriousBroker.it  This test is not yet approved or cleared by the [place] FDA and has been authorized for detection and/or diagnosis of SARS-CoV-2 by FDA under an Emergency Use Authorization (EUA). This EUA will remain in effect (meaning this test can be used) for the duration of the COVID-19 declaration under Section 564(b)(1) of the Act, 21 U.S.C. section 360bbb-3(b)(1), unless the authorization is terminated or revoked.     Resp Syncytial Virus by PCR NEGATIVE NEGATIVE Final    Comment: (NOTE) Fact Sheet for Patients: BloggerCourse.com  Fact Sheet for Healthcare  Providers: SeriousBroker.it  This test is not yet approved or cleared by the [place] FDA and has been authorized for detection and/or diagnosis of SARS-CoV-2 by FDA under an Emergency Use Authorization (EUA). This EUA will remain in effect (meaning this test can be used) for the duration of the COVID-19 declaration under Section 564(b)(1) of the Act, 21 U.S.C. section 360bbb-3(b)(1), unless the authorization is terminated or revoked.  Performed at [facility], [address], [place], [place] [address]     Coagulation Studies: No results for input(s): "LABPROT", "INR" in the last 72 hours.  Urinalysis: No results for input(s): "COLORURINE", "LABSPEC", "PHURINE", "GLUCOSEU", "HGBUR", "BILIRUBINUR", "KETONESUR", "PROTEINUR", "UROBILINOGEN", "NITRITE", "LEUKOCYTESUR" in the last 72 hours.  Invalid input(s): "APPERANCEUR"    Imaging: CT Angio Chest PE W and/or Wo Contrast  Result Date: 09/29/2023 CLINICAL DATA:  52 year old male with right side pain and shortness of breath. Abnormal D-dimer. EXAM: CT ANGIOGRAPHY CHEST WITH CONTRAST TECHNIQUE: Multidetector CT imaging of the chest was performed using the standard protocol during bolus administration of intravenous contrast. Multiplanar CT image reconstructions and MIPs were obtained to evaluate the vascular anatomy. RADIATION DOSE REDUCTION: This exam was performed according to the departmental dose-optimization program which includes automated exposure control, adjustment of the mA and/or kV according to patient size and/or use of iterative reconstruction technique. CONTRAST:  OMNIPAQUE IOHEXOL 350 MG/ML SOLN COMPARISON:  Portable chest 0133 hours today.  FINDINGS: Cardiovascular: Adequate contrast bolus timing in the pulmonary arterial tree. Mild respiratory motion. No central, saddle, lobar pulmonary embolus. No convincing pulmonary artery filling defect. Calcified coronary artery atherosclerosis.  Cardiomegaly. No pericardial effusion. Calcified aortic atherosclerosis. Negative for thoracic aortic aneurysm or dissection. Mediastinum/Nodes: Negative. Subcentimeter mediastinal lymph nodes are at the upper limits of normal. Lungs/Pleura: Atelectatic changes to the major airways. Difficult to exclude superimposed middle and lower lobe circumferential bronchial wall thickening such as on series 5, image 67. And this continues distally in the lower lobes. Major airways remain patent. Mosaic attenuation with some superimposed pulmonary septal thickening bilaterally. Superimposed symmetric dependent lung opacity. No consolidation. No convincing pleural effusion. Upper Abdomen: Negative visible liver, gallbladder, spleen, pancreas, adrenal glands, and bowel in the upper abdomen. Compared to 04/20/2021 CT Abdomen and Pelvis partially visible renal volume loss not significantly changed. Musculoskeletal: Thoracic spine endplate degeneration with flowing endplate osteophytes. No acute or suspicious osseous lesion. Review of the MIP images confirms the above findings. IMPRESSION: 1. No pulmonary embolus identified. 2. Nonspecific pulmonary septal and airway thickening with mosaic attenuation and atelectasis. Top differential considerations include pulmonary edema, viral/atypical airway or respiratory infection. No pleural effusion. 3. Cardiomegaly with calcified coronary artery atherosclerosis. Aortic Atherosclerosis (ICD10-I70.0). Electronically Signed   By: Odessa Fleming M.D.   On: 09/29/2023 07:11   DG Chest Port 1 View  Result Date: 09/29/2023 CLINICAL DATA:  Shortness of breath and right-sided pain. EXAM: PORTABLE CHEST 1 VIEW COMPARISON:  September 22, 2023 FINDINGS: The heart size and mediastinal contours are within normal limits. There is mild to moderate severity prominence of the central pulmonary vasculature. Mild atelectatic changes are noted within the bilateral lung bases. No pleural effusion or pneumothorax is  identified. Multilevel degenerative changes seen throughout the thoracic spine. IMPRESSION: 1. Mild to moderate severity pulmonary vascular congestion. 2. Mild bibasilar atelectasis. Electronically Signed   By: Aram Candela M.D.   On: 09/29/2023 04:00     Medications:      allopurinol  200 mg Oral Daily   amLODipine  5 mg Oral Daily   aspirin EC  81 mg Oral Daily   atorvastatin  80 mg Oral Daily   carvedilol  25 mg Oral BID WC   Chlorhexidine Gluconate Cloth  6 each Topical Q0600   cloNIDine  0.3 mg Oral TID   clopidogrel  75 mg Oral Daily   ezetimibe  10 mg Oral QPM   ferrous sulfate  325 mg Oral BID WC   furosemide  80 mg Oral Daily   gabapentin  100 mg Oral TID   heparin  5,000 Units Subcutaneous Q8H   insulin aspart  0-15 Units Subcutaneous TID WC   insulin aspart  0-5 Units Subcutaneous QHS   insulin detemir  15 Units Subcutaneous QHS   lidocaine  1 patch Transdermal Q24H   linaclotide  145 mcg Oral QAC breakfast   mometasone-formoterol  2 puff Inhalation BID   pantoprazole  40 mg Oral Daily   predniSONE  50 mg Oral Q breakfast   sevelamer carbonate  2,400 mg Oral TID WC   sodium chloride flush  3 mL Intravenous Q12H   sucroferric oxyhydroxide  500 mg Oral TID   acetaminophen, fluticasone, HYDROmorphone, ipratropium-albuterol, ondansetron (ZOFRAN) IV, sodium chloride flush  Assessment/ Plan:  Kelly Key is a 52 y.o.  male with past medical history of COPD, diabetes, gout, HLP, Hypertension and end stage renal disease on hemodialysis. He presents to ED with shortness of  breath. States he went to his dialysis treatment on Saturday but feels like they did not take enough fluid off.   [facility] [place]/TTS/ Lt AVF  End stage renal disease on hemodialysis.  Received treatment yesterday, UF 4.5L achieved. Receiving additional treatment today for fluid removal. Will attempt and additional 4.5-5L as tolerated. Discussed fluid restriction with patient. Next treatment  scheduled for Thursday.   2. Acute respiratory distress likely due to fluid volume overload requiring supplemental oxygen.  Chest x-ray shows mild to moderate vascular congestion.  Successful removal of fluid during dialysis yesterday and receiving additional treatment today. Weaned to 2L [place].   3. Anemia of chronic kidney disease Lab Results  Component Value Date   HGB 8.9 (L) 09/30/2023    Patient receives Mircera at outpatient clinic.  Hgb below desired target (9-11). Will continue to monitor.   4. Secondary Hyperparathyroidism:   Lab Results  Component Value Date   CALCIUM 8.3 (L) 09/30/2023   CAION 1.10 (L) 11/11/2019   PHOS 5.4 (H) 05/06/2023    Calcium and phosphorus acceptable. Will continue to monitor.    LOS: 1 Rhylen Shaheen 12/4/202411:07 AM

## 2023-10-05 ENCOUNTER — Ambulatory Visit (INDEPENDENT_AMBULATORY_CARE_PROVIDER_SITE_OTHER): Payer: 59 | Admitting: Ophthalmology

## 2023-10-05 ENCOUNTER — Encounter (INDEPENDENT_AMBULATORY_CARE_PROVIDER_SITE_OTHER): Payer: Self-pay | Admitting: Ophthalmology

## 2023-10-05 DIAGNOSIS — I10 Essential (primary) hypertension: Secondary | ICD-10-CM | POA: Diagnosis not present

## 2023-10-05 DIAGNOSIS — H25013 Cortical age-related cataract, bilateral: Secondary | ICD-10-CM

## 2023-10-05 DIAGNOSIS — E113413 Type 2 diabetes mellitus with severe nonproliferative diabetic retinopathy with macular edema, bilateral: Secondary | ICD-10-CM | POA: Diagnosis not present

## 2023-10-05 DIAGNOSIS — Z794 Long term (current) use of insulin: Secondary | ICD-10-CM | POA: Diagnosis not present

## 2023-10-05 DIAGNOSIS — H35033 Hypertensive retinopathy, bilateral: Secondary | ICD-10-CM | POA: Diagnosis not present

## 2023-10-05 DIAGNOSIS — F419 Anxiety disorder, unspecified: Secondary | ICD-10-CM

## 2023-10-26 ENCOUNTER — Emergency Department: Payer: 59

## 2023-10-26 ENCOUNTER — Other Ambulatory Visit: Payer: Self-pay

## 2023-10-26 DIAGNOSIS — I509 Heart failure, unspecified: Secondary | ICD-10-CM | POA: Diagnosis not present

## 2023-10-26 DIAGNOSIS — Z5321 Procedure and treatment not carried out due to patient leaving prior to being seen by health care provider: Secondary | ICD-10-CM | POA: Insufficient documentation

## 2023-10-26 DIAGNOSIS — R0602 Shortness of breath: Secondary | ICD-10-CM | POA: Insufficient documentation

## 2023-10-26 DIAGNOSIS — Z992 Dependence on renal dialysis: Secondary | ICD-10-CM | POA: Insufficient documentation

## 2023-10-26 DIAGNOSIS — J449 Chronic obstructive pulmonary disease, unspecified: Secondary | ICD-10-CM | POA: Diagnosis not present

## 2023-10-26 LAB — CBC
HCT: 31.1 % — ABNORMAL LOW (ref 39.0–52.0)
Hemoglobin: 9.9 g/dL — ABNORMAL LOW (ref 13.0–17.0)
MCH: 29.5 pg (ref 26.0–34.0)
MCHC: 31.8 g/dL (ref 30.0–36.0)
MCV: 92.6 fL (ref 80.0–100.0)
Platelets: 273 10*3/uL (ref 150–400)
RBC: 3.36 MIL/uL — ABNORMAL LOW (ref 4.22–5.81)
RDW: 15.9 % — ABNORMAL HIGH (ref 11.5–15.5)
WBC: 8.3 10*3/uL (ref 4.0–10.5)
nRBC: 0 % (ref 0.0–0.2)

## 2023-10-26 NOTE — ED Triage Notes (Signed)
Pt to ED via POV c/o SHOB that started tonight when he was getting ready to go to bed. Says he feels like he has a lot of fluid. Pt has hx of CHF, COPD, and is dialysis. Last dialysis was Saturday and was able to complete session. Denies CP, dizziness, fevers

## 2023-10-27 ENCOUNTER — Emergency Department
Admission: EM | Admit: 2023-10-27 | Discharge: 2023-10-27 | Discharge status: Against Medical Advice | Payer: 59 | Attending: Emergency Medicine | Admitting: Emergency Medicine

## 2023-10-27 LAB — COMPREHENSIVE METABOLIC PANEL
ALT: 21 U/L (ref 0–44)
AST: 15 U/L (ref 15–41)
Albumin: 3.9 g/dL (ref 3.5–5.0)
Alkaline Phosphatase: 67 U/L (ref 38–126)
Anion gap: 15 (ref 5–15)
BUN: 78 mg/dL — ABNORMAL HIGH (ref 6–20)
CO2: 21 mmol/L — ABNORMAL LOW (ref 22–32)
Calcium: 8.6 mg/dL — ABNORMAL LOW (ref 8.9–10.3)
Chloride: 97 mmol/L — ABNORMAL LOW (ref 98–111)
Creatinine, Ser: 12.07 mg/dL — ABNORMAL HIGH (ref 0.61–1.24)
GFR, Estimated: 5 mL/min — ABNORMAL LOW (ref >60)
Glucose, Bld: 196 mg/dL — ABNORMAL HIGH (ref 70–99)
Potassium: 4.8 mmol/L (ref 3.5–5.1)
Sodium: 133 mmol/L — ABNORMAL LOW (ref 135–145)
Total Bilirubin: 0.6 mg/dL (ref 0.0–1.2)
Total Protein: 7.7 g/dL (ref 6.5–8.1)

## 2023-10-27 LAB — TROPONIN I (HIGH SENSITIVITY): Troponin I (High Sensitivity): 24 ng/L — ABNORMAL HIGH (ref <18)

## 2023-10-27 LAB — BRAIN NATRIURETIC PEPTIDE: B Natriuretic Peptide: 598.6 pg/mL — ABNORMAL HIGH (ref 0.0–100.0)

## 2023-11-02 ENCOUNTER — Encounter (INDEPENDENT_AMBULATORY_CARE_PROVIDER_SITE_OTHER): Payer: 59 | Admitting: Ophthalmology

## 2023-11-02 DIAGNOSIS — F419 Anxiety disorder, unspecified: Secondary | ICD-10-CM

## 2023-11-02 DIAGNOSIS — Z794 Long term (current) use of insulin: Secondary | ICD-10-CM

## 2023-11-02 DIAGNOSIS — H25013 Cortical age-related cataract, bilateral: Secondary | ICD-10-CM

## 2023-11-02 DIAGNOSIS — E113413 Type 2 diabetes mellitus with severe nonproliferative diabetic retinopathy with macular edema, bilateral: Secondary | ICD-10-CM

## 2023-11-02 DIAGNOSIS — H35033 Hypertensive retinopathy, bilateral: Secondary | ICD-10-CM

## 2023-11-02 DIAGNOSIS — I10 Essential (primary) hypertension: Secondary | ICD-10-CM

## 2023-11-02 NOTE — Progress Notes (Signed)
 [facility] - Clinic Note  11/04/2023   CHIEF COMPLAINT Patient presents for Retina Follow Up  HISTORY OF PRESENT ILLNESS: Kelly Key is a 53 y.o. male who presents to the clinic today for:  HPI     Retina Follow Up   Patient presents with  Diabetic Retinopathy.  In both eyes.  This started 4 weeks ago.  Duration of 4 weeks.  Since onset it is stable.  I, the attending physician,  performed the HPI with the patient and updated documentation appropriately.        Comments   4 week retina follow up NPDR OU ? Focal laser OS pt is reporting no vision changes noticed he denies flashes or floaters his last reading was 89 this am       Last edited by Valdemar Rogue, MD on 11/04/2023  4:01 PM.    Patient states the vision has not changed.   Referring physician: Luke Agent, MD [address] [place],  [place] [address]  HISTORICAL INFORMATION:  Selected notes from the MEDICAL RECORD NUMBER Referred by Dr. Heron Salinas ([facility]) for diabetic retinopathy LEE:  Ocular Hx- PMH-   CURRENT MEDICATIONS: Current Outpatient Medications (Ophthalmic Drugs)  Medication Sig   prednisoLONE  acetate (PRED FORTE ) 1 % ophthalmic suspension Place 1 drop into the left eye 4 (four) times daily for 7 days.   No current facility-administered medications for this visit. (Ophthalmic Drugs)   Current Outpatient Medications (Other)  Medication Sig   acetaminophen  (TYLENOL ) 500 MG tablet Take 1,000 mg by mouth every 6 (six) hours as needed for moderate pain or headache.   albuterol  (VENTOLIN  HFA) 108 (90 Base) MCG/ACT inhaler Inhale 2 puffs into the lungs every 4 (four) hours as needed for wheezing or shortness of breath.   allopurinol  (ZYLOPRIM ) 100 MG tablet Take 2 tablets (200 mg total) by mouth daily.   AMBULATORY NON FORMULARY MEDICATION Inject 0.2 ml by intracavernosal route as directed.  Medication Name: TriMix PGE 30 mcg Pap   30 mg Phent 1 mg    amLODipine  (NORVASC ) 5 MG tablet Take 5 mg by mouth daily.   aspirin  EC 81 MG tablet Take 1 tablet (81 mg total) by mouth daily.   atorvastatin  (LIPITOR ) 80 MG tablet Take 1 tablet (80 mg total) by mouth daily.   carvedilol  (COREG ) 25 MG tablet Take 1 tablet (25 mg total) by mouth 2 (two) times daily with a meal.   cloNIDine  (CATAPRES ) 0.3 MG tablet Take 1 tablet (0.3 mg total) by mouth 3 (three) times daily.   clopidogrel  (PLAVIX ) 75 MG tablet Take 75 mg by mouth daily.   esomeprazole (NEXIUM) 20 MG capsule Take 20 mg by mouth daily.   ezetimibe  (ZETIA ) 10 MG tablet Take 10 mg by mouth every evening.   ferrous sulfate  325 (65 FE) MG tablet Take 1 tablet (325 mg total) by mouth 2 (two) times daily with a meal.   fluticasone  (FLONASE ) 50 MCG/ACT nasal spray Place 1 spray into both nostrils daily as needed for allergies or rhinitis.   furosemide  (LASIX ) 80 MG tablet Take 80 mg by mouth daily.   gabapentin  (NEURONTIN ) 100 MG capsule Take 1 capsule (100 mg total) by mouth 3 (three) times daily.   glucose blood (ACCU-CHEK AVIVA PLUS) test strip Use as instructed TO CHECK BLOOD GLUCOSE THREE TIMES DAILY   HUMALOG KWIKPEN 100 UNIT/ML KwikPen Inject 8-30 Units into the skin 3 (three) times daily. Sliding scale 30 units  max TID   insulin  detemir (LEVEMIR  FLEXTOUCH) 100 UNIT/ML FlexPen Inject 50 Units into the skin at bedtime.   Insulin  Pen Needle (B-D ULTRAFINE III SHORT PEN) 31G X 8 MM MISC 1 each by Does not apply route as directed.   Ipratropium-Albuterol  (COMBIVENT ) 20-100 MCG/ACT AERS respimat Inhale 1 puff into the lungs every 6 (six) hours as needed for wheezing or shortness of breath.   linaclotide  (LINZESS ) 145 MCG CAPS capsule Take 145 mcg by mouth daily before breakfast.   multivitamin (RENA-VIT) TABS tablet Take 1 tablet by mouth at bedtime.   sevelamer  carbonate (RENVELA ) 800 MG tablet Take 3 tablets (2,400 mg total) by mouth 3 (three) times daily with meals. (Patient taking differently: Take  2,400 mg by mouth 3 (three) times daily with meals. And once daily with snacks)   VELPHORO  500 MG chewable tablet Chew 500 mg by mouth 3 (three) times daily.   Current Facility-Administered Medications (Other)  Medication Route   0.9 %  sodium chloride  infusion Intravenous   REVIEW OF SYSTEMS: ROS   Positive for: Gastrointestinal, Endocrine, Eyes, Respiratory Negative for: Constitutional, Neurological, Skin, Genitourinary, Musculoskeletal, HENT, Cardiovascular, Psychiatric, Allergic/Imm, Heme/Lymph Last edited by Resa Delon ORN, COT on 11/04/2023  3:08 PM.     ALLERGIES Allergies  Allergen Reactions   Ozempic  (0.25 Or 0.5 Mg-Dose) [Semaglutide (0.25 Or 0.5mg -Dos)] Shortness Of Breath   Hydralazine  Anxiety    Hallucinations    Lisinopril Swelling   PAST MEDICAL HISTORY Past Medical History:  Diagnosis Date   Acute diastolic CHF (congestive heart failure) (HCC) 06/11/2012   EF 50-55% [facility])   Chronic kidney disease    COPD (chronic obstructive pulmonary disease) (HCC)    Diabetes mellitus    A1c 11.5 (06/11/2012).   Dyspnea    Gout    Hepatic steatosis 06/11/2012   Elevated LFTs   Hyperlipemia    Malignant hypertension    Microcytic anemia 06/12/2012   Obesity    Past Surgical History:  Procedure Laterality Date   ABDOMINAL AORTOGRAM W/LOWER EXTREMITY N/A 12/01/2022   Procedure: ABDOMINAL AORTOGRAM W/LOWER EXTREMITY;  Surgeon: Gretta Lonni PARAS, MD;  Location: [facility] INVASIVE CV LAB;  Service: Cardiovascular;  Laterality: N/A;   AMPUTATION Left 12/03/2022   Procedure: LEFT SECOND TOE AMPUTATION;  Surgeon: Harden Jerona GAILS, MD;  Location: [facility] OR;  Service: Orthopedics;  Laterality: Left;   AV FISTULA PLACEMENT Left 11/11/2019   Procedure: ARTERIOVENOUS (AV) FISTULA CREATION LEFT BRACHIOCEPHALIC ARM;  Surgeon: Serene Gaile ORN, MD;  Location: [facility] OR;  Service: Vascular;  Laterality: Left;   COLONOSCOPY WITH PROPOFOL  N/A 09/02/2021   Procedure: COLONOSCOPY WITH  PROPOFOL ;  Surgeon: Therisa Bi, MD;  Location[facility] ENDOSCOPY;  Service: Gastroenterology;  Laterality: N/A;   IR FLUORO GUIDE CV LINE RIGHT  07/08/2019   IR REMOVAL TUN CV CATH W/O FL  03/23/2020   IR US  GUIDE VASC ACCESS RIGHT  07/08/2019   LEFT HEART CATH AND CORONARY ANGIOGRAPHY N/A 06/12/2021   Procedure: LEFT HEART CATH AND CORONARY ANGIOGRAPHY;  Surgeon: Claudene Victory ORN, MD;  Location: [facility] INVASIVE CV LAB;  Service: Cardiovascular;  Laterality: N/A;   NO PAST SURGERIES     PERIPHERAL VASCULAR BALLOON ANGIOPLASTY Left 12/01/2022   Procedure: PERIPHERAL VASCULAR BALLOON ANGIOPLASTY;  Surgeon: Gretta Lonni PARAS, MD;  Location: [facility] INVASIVE CV LAB;  Service: Cardiovascular;  Laterality: Left;  Anterior tibial   PERIPHERAL VASCULAR INTERVENTION Left 12/01/2022   Procedure: PERIPHERAL VASCULAR INTERVENTION;  Surgeon: Gretta Lonni PARAS, MD;  Location: [facility] INVASIVE  CV LAB;  Service: Cardiovascular;  Laterality: Left;  sfa   VASCULAR SURGERY     FAMILY HISTORY Family History  Problem Relation Age of Onset   Gout Mother    Asthma Mother    Diabetes Father    Heart failure Father    Diabetes Sister    Hypertension Brother    Pancreatic cancer Brother    Diabetes Sister    SOCIAL HISTORY Social History   Tobacco Use   Smoking status: Never    Passive exposure: Past   Smokeless tobacco: Never  Vaping Use   Vaping status: Never Used  Substance Use Topics   Alcohol use: Not Currently    Comment: haven't drank in over 5 years    Drug use: No       OPHTHALMIC EXAM:  Base Eye Exam     Visual Acuity (Snellen - Linear)       Right Left   Dist [place] 20/30 -1 20/25 -1   Dist ph [employer] 20/30 +1 NI    Correction: Glasses         Tonometry (Tonopen, 3:12 PM)       Right Left   Pressure 10 11         Pupils       Dark Light Shape React APD   Right 2 1 Round Brisk None   Left 2 1 Round Brisk None         Visual Fields       Left Right    Full Full         Extraocular  Movement       Right Left    Full, Ortho Full, Ortho         Neuro/Psych     Oriented x3: Yes   Mood/Affect: Normal         Dilation     Both eyes: 2.5% Phenylephrine  @ 3:12 PM           Slit Lamp and Fundus Exam     External Exam       Right Left   External Normal Normal         Slit Lamp Exam       Right Left   Lids/Lashes Dermatochalasis - upper lid, mild MGD Dermatochalasis - upper lid   Conjunctiva/Sclera Mild Melanosis Mild Melanosis   Cornea Arcus arcus, tear film debris   Anterior Chamber Deep and clear Deep and clear   Iris No NVI, Round and dilated No NVI, Round and dilated   Lens 2+ Nuclear sclerosis, 2-3+ Cortical cataract 2+ Nuclear sclerosis, 2-3+ Cortical cataract   Anterior Vitreous Vitreous syneresis Vitreous syneresis         Fundus Exam       Right Left   Disc Pink and sharp, mild PPA Pink and sharp, mild PPA   C/D Ratio 0.6 0.5   Macula Flat, good foveal reflex, central cystic changes -- stably improved, scattered MA/DBH -- improving, rare punctate exudates Flat, Blunted foveal reflex, central cystic changes, scattered MA/DBH, good focal laser targets, punctate exudates temporal to fovea   Vessels attenuated, Tortuous, mild AV crossing changes, copper wiring attenuated, Tortuous, AV crossing changes, Copper wiring   Periphery Attached, scattered MA/DBH, No edema Attached, scattered MA/DBH, No edema           Refraction     Wearing Rx       Sphere Cylinder Axis   Right -0.25 +1.50 020   Left [place] +  1.25 154           IMAGING AND PROCEDURES  Imaging and Procedures for 11/04/2023  OCT, Retina - OU - Both Eyes       Right Eye Quality was good. Central Foveal Thickness: 269. Progression has been stable. Findings include no SRF, abnormal foveal contour, intraretinal hyper-reflective material, intraretinal fluid, vitreomacular adhesion (Stable improvement in central IRF inferior macula and fovea, stable improvement in SRF,  scattered punctate IRHM - improved, no fluid).   Left Eye Central Foveal Thickness: 286. Progression has been stable. Findings include no SRF, abnormal foveal contour, intraretinal hyper-reflective material, vitreomacular adhesion (Persistent IRF/cystic changes IT fovea and macula).   Notes *Images captured and stored on drive  Diagnosis / Impression:  +DME OU (OD > OS) OD: Stable improvement in central IRF inferior macula and fovea, stable improvement in SRF, scattered punctate IRHM -- improved, no fluid OS: Persistent IRF/cystic changes IT fovea and macula  Clinical management:  See below  Abbreviations: NFP - Normal foveal profile. CME - cystoid macular edema. PED - pigment epithelial detachment. IRF - intraretinal fluid. SRF - subretinal fluid. EZ - ellipsoid zone. ERM - epiretinal membrane. ORA - outer retinal atrophy. ORT - outer retinal tubulation. SRHM - subretinal hyper-reflective material. IRHM - intraretinal hyper-reflective material      Focal Laser - OS - Left Eye       Time Out Confirmed correct patient, procedure, site, and patient consented.   Notes LASER PROCEDURE NOTE  Diagnosis:   Diabetic macular edema, LEFT EYE  Procedure:  Focal laser photocoagulation using slit lamp laser, LEFT EYE  Anesthesia:  Topical  Surgeon: Redell Hans, MD, PhD   Informed consent obtained, operative eye marked, and time out performed prior to initiation of laser.   Lumenis [id number] Focal/Grid laser Lens: Ocular OMRA-S Power: 75 mW Duration: 50 msec  Spot size: 100 microns  # spots: 40 spots placed to scattered perifoveal MAs  Complications: None.  RTC: 6 wks  Patient tolerated the procedure well and received written and verbal post-procedure care information/education.          ASSESSMENT/PLAN:   ICD-10-CM   1. Severe nonproliferative diabetic retinopathy of both eyes with macular edema associated with type 2 diabetes mellitus (HCC)  E11.3413 OCT, Retina - OU -  Both Eyes    Focal Laser - OS - Left Eye    2. Encounter for long-term (current) use of insulin  (HCC)  Z79.4     3. Essential hypertension  I10     4. Hypertensive retinopathy of both eyes  H35.033     5. Cortical age-related cataract of both eyes  H25.013     6. Anxiety  F41.9      1,2. Severe Non-proliferative diabetic retinopathy, both eyes - last A1c 7.4 (12.23.23) - s/p IVA OD #1 (08.28.24), #2 (09.25.24), #3 (10.23.24) - FA (08.28.24) OD: Mild patches of capillary non perfusion peripherally, Late leaking MA, hyperfluorescence of disc, focal perifoveal hyperfluorescence leakage, no NV, OS: Mild patches of capillary non perfusion peripherally, Late leaking MA, no NV - BCVA OD 20/30 from 20/25, OS 20/25 from 20/20 - OCT shows OD: Stable improvement in central IRF inferior macula and fovea, stable improvement in SRF, scattered punctate IRHM -- improved, no fluid; OS: Persistent IRF/cystic changes IT fovea and macula - exam shows scattered MAs OS -- good targets for focal laser - recommend holding IVA OD due to stable improvements in exam and OCT -- pt in agreement - recommend focal  laser OS today, 01.08.25 - RBA of procedure discussed, questions answered - informed consent obtained and signed - see procedure note  - start PF QID OS x7 days - f/u in 6 wks -- DFE/OCT, possible injection  3,4. Hypertensive retinopathy OU - discussed importance of tight BP control - monitor   5.  Mixed Cataract OU - The symptoms of cataract, surgical options, and treatments and risks were discussed with patient. - discussed diagnosis and progression - monitor   6. Anxiety  - affecting ophthalmic examinations and treatments - may benefit from mild anxiolytic prior to treatment visits (intravitreal injections and lasers)  - recommend discussing with primary care provider  Ophthalmic Meds Ordered this visit:  Meds ordered this encounter  Medications   prednisoLONE  acetate (PRED FORTE ) 1 %  ophthalmic suspension    Sig: Place 1 drop into the left eye 4 (four) times daily for 7 days.    Dispense:  1.4 mL    Refill:  0     Return in about 6 weeks (around 12/16/2023) for f/u NPDR OU , DFE, OCT, Possible, IVA, OD.  There are no Patient Instructions on file for this visit.  Explained the diagnoses, plan, and follow up with the patient and they expressed understanding.  Patient expressed understanding of the importance of proper follow up care.   This document serves as a record of services personally performed by Redell JUDITHANN Hans, MD, PhD. It was created on their behalf by Alan PARAS. Delores, OA an ophthalmic technician. The creation of this record is the provider's dictation and/or activities during the visit.    Electronically signed by: Alan PARAS. Delores, OA 11/04/23 4:21 PM  This document serves as a record of services personally performed by Redell JUDITHANN Hans, MD, PhD. It was created on their behalf by Wanda GEANNIE Keens, COT an ophthalmic technician. The creation of this record is the provider's dictation and/or activities during the visit.    Electronically signed by:  Wanda GEANNIE Keens, COT  11/04/23 4:21 PM  Redell JUDITHANN Hans, M.D., Ph.D. Diseases & Surgery of the Retina and Vitreous [facility] 11/04/2023  I have reviewed the above documentation for accuracy and completeness, and I agree with the above. Redell JUDITHANN Hans, M.D., Ph.D. 11/04/23 4:24 PM   Abbreviations: M myopia (nearsighted); A astigmatism; H hyperopia (farsighted); P presbyopia; Mrx spectacle prescription;  CTL contact lenses; OD right eye; OS left eye; OU both eyes  XT exotropia; ET esotropia; PEK punctate epithelial keratitis; PEE punctate epithelial erosions; DES dry eye syndrome; MGD meibomian gland dysfunction; ATs artificial tears; PFAT's preservative free artificial tears; NSC nuclear sclerotic cataract; PSC posterior subcapsular cataract; ERM epi-retinal membrane; PVD posterior  vitreous detachment; RD retinal detachment; DM diabetes mellitus; DR diabetic retinopathy; NPDR non-proliferative diabetic retinopathy; PDR proliferative diabetic retinopathy; CSME clinically significant macular edema; DME diabetic macular edema; dbh dot blot hemorrhages; CWS cotton wool spot; POAG primary open angle glaucoma; C/D cup-to-disc ratio; HVF humphrey visual field; GVF goldmann visual field; OCT optical coherence tomography; IOP intraocular pressure; BRVO Branch retinal vein occlusion; CRVO central retinal vein occlusion; CRAO central retinal artery occlusion; BRAO branch retinal artery occlusion; RT retinal tear; SB scleral buckle; PPV pars plana vitrectomy; VH Vitreous hemorrhage; PRP panretinal laser photocoagulation; IVK intravitreal kenalog; VMT vitreomacular traction; MH Macular hole;  NVD neovascularization of the disc; NVE neovascularization elsewhere; AREDS age related eye disease study; ARMD age related macular degeneration; POAG primary open angle glaucoma; EBMD epithelial/anterior basement membrane dystrophy;  ACIOL anterior chamber intraocular lens; IOL intraocular lens; PCIOL posterior chamber intraocular lens; Phaco/IOL phacoemulsification with intraocular lens placement; PRK photorefractive keratectomy; LASIK laser assisted in situ keratomileusis; HTN hypertension; DM diabetes mellitus; COPD chronic obstructive pulmonary disease

## 2023-11-04 ENCOUNTER — Ambulatory Visit (INDEPENDENT_AMBULATORY_CARE_PROVIDER_SITE_OTHER): Payer: 59 | Admitting: Ophthalmology

## 2023-11-04 ENCOUNTER — Encounter (INDEPENDENT_AMBULATORY_CARE_PROVIDER_SITE_OTHER): Payer: Self-pay | Admitting: Ophthalmology

## 2023-11-04 DIAGNOSIS — Z794 Long term (current) use of insulin: Secondary | ICD-10-CM

## 2023-11-04 DIAGNOSIS — H25013 Cortical age-related cataract, bilateral: Secondary | ICD-10-CM

## 2023-11-04 DIAGNOSIS — I10 Essential (primary) hypertension: Secondary | ICD-10-CM | POA: Diagnosis not present

## 2023-11-04 DIAGNOSIS — H35033 Hypertensive retinopathy, bilateral: Secondary | ICD-10-CM | POA: Diagnosis not present

## 2023-11-04 DIAGNOSIS — E113413 Type 2 diabetes mellitus with severe nonproliferative diabetic retinopathy with macular edema, bilateral: Secondary | ICD-10-CM | POA: Diagnosis not present

## 2023-11-04 DIAGNOSIS — F419 Anxiety disorder, unspecified: Secondary | ICD-10-CM

## 2023-11-04 MED ORDER — PREDNISOLONE ACETATE 1 % OP SUSP: 1.0000 [drp] | Freq: Four times a day (QID) | OPHTHALMIC | 0 refills | Status: AC

## 2023-11-20 NOTE — Progress Notes (Signed)
 [facility] - Clinic Note  12/02/2023   CHIEF COMPLAINT Patient presents for Retina Follow Up  HISTORY OF PRESENT ILLNESS: Kelly Key is a 53 y.o. male who presents to the clinic today for:  HPI     Retina Follow Up   Patient presents with  Diabetic Retinopathy.  In both eyes.  This started 4 weeks ago.  Duration of 4 weeks.  Since onset it is stable.  I, the attending physician,  performed the HPI with the patient and updated documentation appropriately.        Comments   4 week retina follow up NPDR OU and IVA OD pt is reporting no vision changes noticed he denies any flashes or floaters pt last reading 150      Last edited by Valdemar Rogue, MD on 12/02/2023 12:39 PM.     Patient states vision is the same, blood sugar and blood pressure are under control  Referring physician: Luke Agent, MD [address] [place],  [place] [address]  HISTORICAL INFORMATION:  Selected notes from the MEDICAL RECORD NUMBER Referred by Dr. Heron Salinas ([facility]) for diabetic retinopathy LEE:  Ocular Hx- PMH-   CURRENT MEDICATIONS: No current outpatient medications on file. (Ophthalmic Drugs)   No current facility-administered medications for this visit. (Ophthalmic Drugs)   Current Outpatient Medications (Other)  Medication Sig   acetaminophen  (TYLENOL ) 500 MG tablet Take 1,000 mg by mouth every 6 (six) hours as needed for moderate pain or headache.   albuterol  (VENTOLIN  HFA) 108 (90 Base) MCG/ACT inhaler Inhale 2 puffs into the lungs every 4 (four) hours as needed for wheezing or shortness of breath.   allopurinol  (ZYLOPRIM ) 100 MG tablet Take 2 tablets (200 mg total) by mouth daily.   AMBULATORY NON FORMULARY MEDICATION Inject 0.2 ml by intracavernosal route as directed.  Medication Name: TriMix PGE 30 mcg Pap   30 mg Phent 1 mg   amLODipine  (NORVASC ) 5 MG tablet Take 5 mg by mouth daily.   aspirin  EC 81 MG tablet Take 1 tablet (81 mg  total) by mouth daily.   atorvastatin  (LIPITOR ) 80 MG tablet Take 1 tablet (80 mg total) by mouth daily.   carvedilol  (COREG ) 25 MG tablet Take 1 tablet (25 mg total) by mouth 2 (two) times daily with a meal.   cloNIDine  (CATAPRES ) 0.3 MG tablet Take 1 tablet (0.3 mg total) by mouth 3 (three) times daily.   clopidogrel  (PLAVIX ) 75 MG tablet Take 75 mg by mouth daily.   esomeprazole (NEXIUM) 20 MG capsule Take 20 mg by mouth daily.   ezetimibe  (ZETIA ) 10 MG tablet Take 10 mg by mouth every evening.   ferrous sulfate  325 (65 FE) MG tablet Take 1 tablet (325 mg total) by mouth 2 (two) times daily with a meal.   fluticasone  (FLONASE ) 50 MCG/ACT nasal spray Place 1 spray into both nostrils daily as needed for allergies or rhinitis.   gabapentin  (NEURONTIN ) 100 MG capsule Take 1 capsule (100 mg total) by mouth 3 (three) times daily.   glucose blood (ACCU-CHEK AVIVA PLUS) test strip Use as instructed TO CHECK BLOOD GLUCOSE THREE TIMES DAILY   HUMALOG KWIKPEN 100 UNIT/ML KwikPen Inject 8-30 Units into the skin 3 (three) times daily. Sliding scale 30 units max TID   insulin  detemir (LEVEMIR  FLEXTOUCH) 100 UNIT/ML FlexPen Inject 50 Units into the skin at bedtime.   Insulin  Pen Needle (B-D ULTRAFINE III SHORT PEN) 31G X 8 MM MISC 1  each by Does not apply route as directed.   Ipratropium-Albuterol  (COMBIVENT ) 20-100 MCG/ACT AERS respimat Inhale 1 puff into the lungs every 6 (six) hours as needed for wheezing or shortness of breath.   linaclotide  (LINZESS ) 145 MCG CAPS capsule Take 145 mcg by mouth daily before breakfast.   multivitamin (RENA-VIT) TABS tablet Take 1 tablet by mouth at bedtime.   sevelamer  carbonate (RENVELA ) 800 MG tablet Take 3 tablets (2,400 mg total) by mouth 3 (three) times daily with meals. (Patient taking differently: Take 2,400 mg by mouth 3 (three) times daily with meals. And once daily with snacks)   torsemide  (DEMADEX ) 20 MG tablet Take 2 tablets (40 mg total) by mouth daily.    VELPHORO  500 MG chewable tablet Chew 500 mg by mouth 3 (three) times daily.   Current Facility-Administered Medications (Other)  Medication Route   0.9 %  sodium chloride  infusion Intravenous   REVIEW OF SYSTEMS: ROS   Positive for: Gastrointestinal, Endocrine, Eyes, Respiratory Negative for: Constitutional, Neurological, Skin, Genitourinary, Musculoskeletal, HENT, Cardiovascular, Psychiatric, Allergic/Imm, Heme/Lymph Last edited by Resa Delon ORN, COT on 12/02/2023  9:40 AM.      ALLERGIES Allergies  Allergen Reactions   Ozempic  (0.25 Or 0.5 Mg-Dose) [Semaglutide (0.25 Or 0.5mg -Dos)] Shortness Of Breath   Hydralazine  Anxiety    Hallucinations    Lisinopril Swelling   PAST MEDICAL HISTORY Past Medical History:  Diagnosis Date   Acute diastolic CHF (congestive heart failure) (HCC) 06/11/2012   EF 50-55% [facility])   Chronic kidney disease    COPD (chronic obstructive pulmonary disease) (HCC)    Diabetes mellitus    A1c 11.5 (06/11/2012).   Dyspnea    Gout    Hepatic steatosis 06/11/2012   Elevated LFTs   Hyperlipemia    Malignant hypertension    Microcytic anemia 06/12/2012   Obesity    Past Surgical History:  Procedure Laterality Date   ABDOMINAL AORTOGRAM W/LOWER EXTREMITY N/A 12/01/2022   Procedure: ABDOMINAL AORTOGRAM W/LOWER EXTREMITY;  Surgeon: Gretta Lonni PARAS, MD;  Location: [facility] INVASIVE CV LAB;  Service: Cardiovascular;  Laterality: N/A;   AMPUTATION Left 12/03/2022   Procedure: LEFT SECOND TOE AMPUTATION;  Surgeon: Harden Jerona GAILS, MD;  Location: [facility] OR;  Service: Orthopedics;  Laterality: Left;   AV FISTULA PLACEMENT Left 11/11/2019   Procedure: ARTERIOVENOUS (AV) FISTULA CREATION LEFT BRACHIOCEPHALIC ARM;  Surgeon: Serene Gaile ORN, MD;  Location: [facility] OR;  Service: Vascular;  Laterality: Left;   COLONOSCOPY WITH PROPOFOL  N/A 09/02/2021   Procedure: COLONOSCOPY WITH PROPOFOL ;  Surgeon: Therisa Bi, MD;  Location: [facility] ENDOSCOPY;  Service:  Gastroenterology;  Laterality: N/A;   IR FLUORO GUIDE CV LINE RIGHT  07/08/2019   IR REMOVAL TUN CV CATH W/O FL  03/23/2020   IR US  GUIDE VASC ACCESS RIGHT  07/08/2019   LEFT HEART CATH AND CORONARY ANGIOGRAPHY N/A 06/12/2021   Procedure: LEFT HEART CATH AND CORONARY ANGIOGRAPHY;  Surgeon: Claudene Victory ORN, MD;  Location: [facility] INVASIVE CV LAB;  Service: Cardiovascular;  Laterality: N/A;   NO PAST SURGERIES     PERIPHERAL VASCULAR BALLOON ANGIOPLASTY Left 12/01/2022   Procedure: PERIPHERAL VASCULAR BALLOON ANGIOPLASTY;  Surgeon: Gretta Lonni PARAS, MD;  Location: [facility] INVASIVE CV LAB;  Service: Cardiovascular;  Laterality: Left;  Anterior tibial   PERIPHERAL VASCULAR INTERVENTION Left 12/01/2022   Procedure: PERIPHERAL VASCULAR INTERVENTION;  Surgeon: Gretta Lonni PARAS, MD;  Location: [facility] INVASIVE CV LAB;  Service: Cardiovascular;  Laterality: Left;  sfa   VASCULAR SURGERY  FAMILY HISTORY Family History  Problem Relation Age of Onset   Gout Mother    Asthma Mother    Diabetes Father    Heart failure Father    Diabetes Sister    Hypertension Brother    Pancreatic cancer Brother    Diabetes Sister    SOCIAL HISTORY Social History   Tobacco Use   Smoking status: Never    Passive exposure: Past   Smokeless tobacco: Never  Vaping Use   Vaping status: Never Used  Substance Use Topics   Alcohol use: Not Currently    Comment: haven't drank in over 5 years    Drug use: No       OPHTHALMIC EXAM:  Base Eye Exam     Visual Acuity (Snellen - Linear)       Right Left   Dist [place] 20/30 20/20 -1   Dist ph [place] NI          Tonometry (Tonopen, 9:44 AM)       Right Left   Pressure 14 14         Pupils       Pupils Dark Light Shape React APD   Right PERRL 2 1 Round Brisk None   Left PERRL 2 1 Round Brisk None         Visual Fields       Left Right    Full Full         Extraocular Movement       Right Left    Full, Ortho Full, Ortho         Neuro/Psych      Oriented x3: Yes   Mood/Affect: Normal         Dilation     Both eyes: 2.5% Phenylephrine  @ 9:44 AM           Slit Lamp and Fundus Exam     External Exam       Right Left   External Normal Normal         Slit Lamp Exam       Right Left   Lids/Lashes Dermatochalasis - upper lid, mild MGD Dermatochalasis - upper lid   Conjunctiva/Sclera Mild Melanosis Mild Melanosis   Cornea Arcus arcus, tear film debris   Anterior Chamber Deep and clear Deep and clear   Iris No NVI, Round and dilated No NVI, Round and dilated   Lens 2+ Nuclear sclerosis, 2-3+ Cortical cataract 2+ Nuclear sclerosis, 2-3+ Cortical cataract   Anterior Vitreous Vitreous syneresis Vitreous syneresis         Fundus Exam       Right Left   Disc Pink and sharp, mild PPA, +SVP Pink and sharp, mild PPA   C/D Ratio 0.6 0.5   Macula Flat, good foveal reflex, central cystic changes -- stably improved, scattered MA/DBH -- improving Flat, Blunted foveal reflex, central cystic changes -- improved, scattered MA/DBH, good focal laser changes   Vessels attenuated, Tortuous attenuated, Tortuous   Periphery Attached, scattered MA/DBH Attached, scattered MA/DBH           Refraction     Wearing Rx       Sphere Cylinder Axis   Right -0.25 +1.50 020   Left [place] +1.25 154           IMAGING AND PROCEDURES  Imaging and Procedures for 12/02/2023  OCT, Retina - OU - Both Eyes       Right Eye Quality was good. Central Foveal  Thickness: 269. Progression has been stable. Findings include normal foveal contour, no IRF, no SRF, intraretinal hyper-reflective material, vitreomacular adhesion (Stable improvement in central IRF and SRF, scattered punctate IRHM - improved, no fluid).   Left Eye Central Foveal Thickness: 286. Progression has improved. Findings include normal foveal contour, no IRF, no SRF, intraretinal hyper-reflective material, vitreomacular adhesion (Interval improvement in IRF/cystic changes IT  fovea and macula).   Notes *Images captured and stored on drive  Diagnosis / Impression:  +DME OU (OD > OS) OD: Stable improvement in central IRF ans SRF, scattered punctate IRHM -- improved, no fluid OS: Interval improvement in IRF/cystic changes IT fovea and macula  Clinical management:  See below  Abbreviations: NFP - Normal foveal profile. CME - cystoid macular edema. PED - pigment epithelial detachment. IRF - intraretinal fluid. SRF - subretinal fluid. EZ - ellipsoid zone. ERM - epiretinal membrane. ORA - outer retinal atrophy. ORT - outer retinal tubulation. SRHM - subretinal hyper-reflective material. IRHM - intraretinal hyper-reflective material           ASSESSMENT/PLAN:   ICD-10-CM   1. Severe nonproliferative diabetic retinopathy of both eyes with macular edema associated with type 2 diabetes mellitus (HCC)  E11.3413 OCT, Retina - OU - Both Eyes    2. Encounter for long-term (current) use of insulin  (HCC)  Z79.4     3. Essential hypertension  I10     4. Hypertensive retinopathy of both eyes  H35.033     5. Cortical age-related cataract of both eyes  H25.013     6. Anxiety  F41.9      1,2. Severe Non-proliferative diabetic retinopathy, both eyes - last A1c 7.4 (12.23.23) - s/p IVA OD #1 (08.28.24), #2 (09.25.24), #3 (10.23.24) - s/p focal laser OS (01.08.2) - FA (08.28.24) OD: Mild patches of capillary non perfusion peripherally, Late leaking MA, hyperfluorescence of disc, focal perifoveal hyperfluorescence leakage, no NV, OS: Mild patches of capillary non perfusion peripherally, Late leaking MA, no NV - BCVA OD 20/30 -- stable, OS 20/20 from 20/25 - OCT shows OD: Stable improvement in central IRF and SRF, scattered punctate IRHM -- improved, no fluid at 3+ mos since last IVA; OS: interval improvement in IRF/cystic changes IT fovea and macula - exam shows scattered MAs OS -- improved post focal laser - recommend holding IVA OD due to stable improvements in exam and  OCT -- pt in agreement - no tx OS - f/u in 8 wks -- DFE/OCT, possible injection  3,4. Hypertensive retinopathy OU - discussed importance of tight BP control - monitor   5.  Mixed Cataract OU - The symptoms of cataract, surgical options, and treatments and risks were discussed with patient. - discussed diagnosis and progression - monitor   6. Anxiety  - affecting ophthalmic examinations and treatments - may benefit from mild anxiolytic prior to treatment visits (intravitreal injections and lasers)  - recommend discussing with primary care provider  Ophthalmic Meds Ordered this visit:  No orders of the defined types were placed in this encounter.    Return in about 8 weeks (around 01/27/2024) for f/u NPDR OU, DFE, OCT.  There are no Patient Instructions on file for this visit.  Explained the diagnoses, plan, and follow up with the patient and they expressed understanding.  Patient expressed understanding of the importance of proper follow up care.   This document serves as a record of services personally performed by Redell JUDITHANN Hans, MD, PhD. It was created on their behalf by Alan  DOROTHA Daring, OA an ophthalmic technician. The creation of this record is the provider's dictation and/or activities during the visit.    Electronically signed by: Alan PARAS. Daring, OA 12/02/23 12:40 PM  Redell JUDITHANN Hans, M.D., Ph.D. Diseases & Surgery of the Retina and Vitreous [facility] 12/02/2023  I have reviewed the above documentation for accuracy and completeness, and I agree with the above. Redell JUDITHANN Hans, M.D., Ph.D. 12/02/23 12:42 PM   Abbreviations: M myopia (nearsighted); A astigmatism; H hyperopia (farsighted); P presbyopia; Mrx spectacle prescription;  CTL contact lenses; OD right eye; OS left eye; OU both eyes  XT exotropia; ET esotropia; PEK punctate epithelial keratitis; PEE punctate epithelial erosions; DES dry eye syndrome; MGD meibomian gland dysfunction; ATs  artificial tears; PFAT's preservative free artificial tears; NSC nuclear sclerotic cataract; PSC posterior subcapsular cataract; ERM epi-retinal membrane; PVD posterior vitreous detachment; RD retinal detachment; DM diabetes mellitus; DR diabetic retinopathy; NPDR non-proliferative diabetic retinopathy; PDR proliferative diabetic retinopathy; CSME clinically significant macular edema; DME diabetic macular edema; dbh dot blot hemorrhages; CWS cotton wool spot; POAG primary open angle glaucoma; C/D cup-to-disc ratio; HVF humphrey visual field; GVF goldmann visual field; OCT optical coherence tomography; IOP intraocular pressure; BRVO Branch retinal vein occlusion; CRVO central retinal vein occlusion; CRAO central retinal artery occlusion; BRAO branch retinal artery occlusion; RT retinal tear; SB scleral buckle; PPV pars plana vitrectomy; VH Vitreous hemorrhage; PRP panretinal laser photocoagulation; IVK intravitreal kenalog; VMT vitreomacular traction; MH Macular hole;  NVD neovascularization of the disc; NVE neovascularization elsewhere; AREDS age related eye disease study; ARMD age related macular degeneration; POAG primary open angle glaucoma; EBMD epithelial/anterior basement membrane dystrophy; ACIOL anterior chamber intraocular lens; IOL intraocular lens; PCIOL posterior chamber intraocular lens; Phaco/IOL phacoemulsification with intraocular lens placement; PRK photorefractive keratectomy; LASIK laser assisted in situ keratomileusis; HTN hypertension; DM diabetes mellitus; COPD chronic obstructive pulmonary disease

## 2023-11-27 ENCOUNTER — Encounter: Payer: Self-pay | Admitting: Nurse Practitioner

## 2023-11-27 ENCOUNTER — Ambulatory Visit: Payer: 59 | Attending: Nurse Practitioner | Admitting: Nurse Practitioner

## 2023-11-27 VITALS — BP 130/72 | HR 84 | Ht 69.0 in | Wt 339.4 lb

## 2023-11-27 DIAGNOSIS — I5033 Acute on chronic diastolic (congestive) heart failure: Secondary | ICD-10-CM

## 2023-11-27 DIAGNOSIS — I10 Essential (primary) hypertension: Secondary | ICD-10-CM | POA: Diagnosis not present

## 2023-11-27 DIAGNOSIS — I739 Peripheral vascular disease, unspecified: Secondary | ICD-10-CM

## 2023-11-27 DIAGNOSIS — E785 Hyperlipidemia, unspecified: Secondary | ICD-10-CM

## 2023-11-27 DIAGNOSIS — Z79899 Other long term (current) drug therapy: Secondary | ICD-10-CM

## 2023-11-27 DIAGNOSIS — J449 Chronic obstructive pulmonary disease, unspecified: Secondary | ICD-10-CM

## 2023-11-27 DIAGNOSIS — N186 End stage renal disease: Secondary | ICD-10-CM

## 2023-11-27 MED ORDER — TORSEMIDE 20 MG PO TABS: 40.0000 mg | ORAL_TABLET | Freq: Every day | ORAL | 6 refills | Status: DC

## 2023-11-27 NOTE — Patient Instructions (Addendum)
Medication Instructions:   Stop Lasix (Furosemide) Begin Torsemide 40mg  daily  Continue all other medications.     Labwork:  BMET, Mg, BNP - orders given today Office will contact with results via phone, letter or mychart.     Testing/Procedures:  none  Follow-Up:  4-6 weeks    Any Other Special Instructions Will Be Listed Below (If Applicable).  Weight log  BP log - take before and after dialysis  Salty six info  CHF info   If you need a refill on your cardiac medications before your next appointment, please call your pharmacy.

## 2023-11-27 NOTE — Progress Notes (Unsigned)
Cardiology Office Note:  .   Date: 11/27/2023 ID:  Kelly Key, DOB Aug 11, 1971, MRN [id number] PCP: Pearson Grippe, MD  [facility] Providers Cardiologist:  Dina Rich, MD    History of Present Illness: .   Kelly Key is a 53 y.o. male with a PMH of HFpEF, hypertension, type 2 diabetes, chronic IDA, ESRD on hemodialysis (Tuesday, Thursday, Saturday schedule), HLD, COPD, and morbid obesity, who presents today for scheduled follow-up.  Previous cardiovascular history includes NSTEMI in 2022. Cardiac catheterization revealed 60% proximal to mid RCA, eccentric 40% PDA distal stenosis.  Ostial LAD 40% stenosis.  Minimal disease in distal left main with widely patent circumflex.  Medical management recommended.  He has not been seen by outpatient cardiology since 2022.  Past history of left SFA stenting to heal left second toe amputation.  Followed by [facility].  Within the past year he has had several ED visits and hospital admissions.  Seen at [facility] ED on September 24, 2023 after a fall.  Said he fell out of bed and hit his head, denied LOC.  He was advised to go to the ED to have a CT of his head performed.  Imaging was negative for anything acute.  Most recently he has was hospitalized in December 2024 for due to acute hypoxic respiratory failure that was likely secondary to combined effect from acute COPD exacerbation as well as acute on chronic HFpEF decompensation.  Today he presents for follow-up with his wife.  He tells me he has had issues with fluid buildup recently.  Says he gets short of breath when this occurs.  Does admit to low BPs happening at times with low SBP reported to be in 60s, says he has to be careful with how he positions his feet.  I do not have BP log to evaluate his BP trends recently. Denies any chest pain, palpitations, syncope, presyncope, dizziness, orthopnea, PND, acute bleeding, or claudication.  ROS: Negative.  See HPI.  Studies Reviewed: .     Echo 12/2022:  1. Left ventricular ejection fraction, by estimation, is 60 to 65%. The  left ventricle has normal function. The left ventricle has no regional  wall motion abnormalities. There is moderate left ventricular hypertrophy.  Left ventricular diastolic  parameters are consistent with Grade II diastolic dysfunction  (pseudonormalization).   2. Right ventricular systolic function is normal. The right ventricular  size is normal. There is normal pulmonary artery systolic pressure. The  estimated right ventricular systolic pressure is 27.8 mmHg.   3. Left atrial size was severely dilated.   4. The mitral valve is abnormal. Trivial mitral valve regurgitation. No  evidence of mitral stenosis. Moderate to severe mitral annular  calcification.   5. The aortic valve is tricuspid. There is mild calcification of the  aortic valve. Aortic valve regurgitation is not visualized. Mild aortic  valve stenosis. Aortic valve mean gradient measures 12.0 mmHg. Aortic  valve Vmax measures 2.34 m/s.   6. Aortic dilatation noted. There is moderate dilatation of the aortic  root, measuring 42 mm. There is moderate dilatation of the ascending  aorta, measuring 44 mm.   7. The inferior vena cava is dilated in size with <50% respiratory  variability, suggesting right atrial pressure of 15 mmHg.   Comparison(s): No significant change from prior study.  LHC 05/2021:    60% proximal to mid RCA, eccentric 40% PDA distal stenosis.   Less than 25% distal left main.   Ostial LAD  with eccentric 40% stenosis.   Widely patent circumflex   Upper normal LV end-diastolic pressure.  Contrast ventriculography was not performed.   Aggressive risk factor modification including tight blood pressure control, LDL less than 70 and preferably less than 55, weight loss, evaluation for sleep apnea, and diabetes control. High risk for future cardiac events. Does not have critical coronary disease and could be a candidate  for kidney transplantation with risk factor modification going forward.   Physical Exam:   VS:  BP 130/72   Pulse 84   Ht 5\' 9"  (1.753 m)   Wt (!) 339 lb 6.4 oz (154 kg)   SpO2 95%   BMI 50.12 kg/m    Wt Readings from Last 3 Encounters:  11/27/23 (!) 339 lb 6.4 oz (154 kg)  10/26/23 (!) 310 lb (140.6 kg)  09/30/23 (!) 323 lb 6.6 oz (146.7 kg)    GEN: Morbidly obese, 53 year old male in no acute distress NECK: No JVD; No carotid bruits CARDIAC: S1/S2, RRR, no murmurs, rubs, gallops RESPIRATORY:  Clear to auscultation without rales, wheezing or rhonchi  ABDOMEN: Soft, non-tender, non-distended EXTREMITIES:  Nonpitting edema to BLE; No deformity   ASSESSMENT AND PLAN: .    Acute on chronic diastolic CHF, medication management Stage C, NYHA class II-III symptoms. EF 60-65% 12/2022. Has had issues of volume overload and heart failure exacerbations for the past couple of months.  Weight is up over 10 pounds since 2 months ago.  Will stop Lasix and switch to torsemide 40 mg daily.  Will obtain BMET, magnesium, proBNP in 1 week.  Continue carvedilol. Low sodium diet, fluid restriction <2L, and daily weights encouraged. Educated to contact our office for weight gain of 2 lbs overnight or 5 lbs in one week.  GDMT is limited due to hx of ESRD. Care and ED precautions discussed.  Hypertension Blood pressure borderline elevated today, says he has had issues of BP dropping in the past.  I do not have recent trends to review. Given BP log. Discussed to monitor BP at home at least 2 hours after medications and sitting for 5-10 minutes.  Also discussed to log BP trends before and after hemodialysis. No medication changes at this time. Care and ED precautions discussed.  PAD, Hyperlipidemia LDL 55 in October 2024.  Has been evaluated in the past for critical limb ischemia with tissue loss of left lower extremity.  Has been followed by [facility].  Underwent angioplasty along anterior tibial of left lower  extremity along with stent of left SFA.  Most recent ABIs normal.  Arterial duplex in November 2024 revealed patent stent without evidence of stenosis of SFA on left side.  Continue current medication regimen.  Continue to follow-up with [facility].  COPD Admits to some shortness of breath related to history of heart failure-see above.  No concerning issues related to COPD exacerbation at this time.  No medication changes at this time.  Continue follow-up with PCP.  ESRD Continue hemodialysis treatments.  Discussed/recommended logging BP trends before and after dialysis.  He and his wife verbalized understanding.  Morbid obesity Weight loss via diet and exercise encouraged. Discussed the impact being overweight would have on cardiovascular risk.   Dispo: Follow-up with me/APP in 4 to 6 weeks or sooner if any changes.  Signed, Sharlene Dory, NP

## 2023-11-30 ENCOUNTER — Other Ambulatory Visit (HOSPITAL_COMMUNITY)
Admission: RE | Admit: 2023-11-30 | Discharge: 2023-11-30 | Disposition: A | Discharge status: Home / Self Care | Payer: 59 | Source: Ambulatory Visit | Attending: Nurse Practitioner | Admitting: Nurse Practitioner

## 2023-11-30 DIAGNOSIS — I5033 Acute on chronic diastolic (congestive) heart failure: Secondary | ICD-10-CM | POA: Diagnosis present

## 2023-11-30 DIAGNOSIS — I10 Essential (primary) hypertension: Secondary | ICD-10-CM | POA: Diagnosis present

## 2023-11-30 DIAGNOSIS — Z79899 Other long term (current) drug therapy: Secondary | ICD-10-CM | POA: Diagnosis present

## 2023-11-30 LAB — MAGNESIUM: Magnesium: 2.6 mg/dL — ABNORMAL HIGH (ref 1.7–2.4)

## 2023-11-30 LAB — BASIC METABOLIC PANEL
Anion gap: 15 (ref 5–15)
BUN: 71 mg/dL — ABNORMAL HIGH (ref 6–20)
CO2: 21 mmol/L — ABNORMAL LOW (ref 22–32)
Calcium: 8.6 mg/dL — ABNORMAL LOW (ref 8.9–10.3)
Chloride: 98 mmol/L (ref 98–111)
Creatinine, Ser: 11.36 mg/dL — ABNORMAL HIGH (ref 0.61–1.24)
GFR, Estimated: 5 mL/min — ABNORMAL LOW (ref >60)
Glucose, Bld: 146 mg/dL — ABNORMAL HIGH (ref 70–99)
Potassium: 4.8 mmol/L (ref 3.5–5.1)
Sodium: 134 mmol/L — ABNORMAL LOW (ref 135–145)

## 2023-11-30 LAB — BRAIN NATRIURETIC PEPTIDE: B Natriuretic Peptide: 244 pg/mL — ABNORMAL HIGH (ref 0.0–100.0)

## 2023-12-02 ENCOUNTER — Ambulatory Visit (INDEPENDENT_AMBULATORY_CARE_PROVIDER_SITE_OTHER): Payer: 59 | Admitting: Ophthalmology

## 2023-12-02 ENCOUNTER — Encounter (INDEPENDENT_AMBULATORY_CARE_PROVIDER_SITE_OTHER): Payer: Self-pay | Admitting: Ophthalmology

## 2023-12-02 DIAGNOSIS — E113413 Type 2 diabetes mellitus with severe nonproliferative diabetic retinopathy with macular edema, bilateral: Secondary | ICD-10-CM | POA: Diagnosis not present

## 2023-12-02 DIAGNOSIS — Z794 Long term (current) use of insulin: Secondary | ICD-10-CM

## 2023-12-02 DIAGNOSIS — I10 Essential (primary) hypertension: Secondary | ICD-10-CM

## 2023-12-02 DIAGNOSIS — H25013 Cortical age-related cataract, bilateral: Secondary | ICD-10-CM

## 2023-12-02 DIAGNOSIS — H35033 Hypertensive retinopathy, bilateral: Secondary | ICD-10-CM

## 2023-12-02 DIAGNOSIS — F419 Anxiety disorder, unspecified: Secondary | ICD-10-CM

## 2023-12-04 ENCOUNTER — Other Ambulatory Visit: Payer: Self-pay | Admitting: Nurse Practitioner

## 2023-12-04 DIAGNOSIS — I5033 Acute on chronic diastolic (congestive) heart failure: Secondary | ICD-10-CM

## 2023-12-04 DIAGNOSIS — Z79899 Other long term (current) drug therapy: Secondary | ICD-10-CM

## 2023-12-09 ENCOUNTER — Other Ambulatory Visit (HOSPITAL_COMMUNITY)
Admission: RE | Admit: 2023-12-09 | Discharge: 2023-12-09 | Disposition: A | Discharge status: Home / Self Care | Payer: 59 | Source: Ambulatory Visit | Attending: Nurse Practitioner | Admitting: Nurse Practitioner

## 2023-12-09 DIAGNOSIS — Z79899 Other long term (current) drug therapy: Secondary | ICD-10-CM | POA: Diagnosis present

## 2023-12-09 DIAGNOSIS — I5033 Acute on chronic diastolic (congestive) heart failure: Secondary | ICD-10-CM | POA: Insufficient documentation

## 2023-12-09 LAB — MAGNESIUM: Magnesium: 2.3 mg/dL (ref 1.7–2.4)

## 2024-01-01 ENCOUNTER — Encounter: Payer: Self-pay | Admitting: Nurse Practitioner

## 2024-01-01 ENCOUNTER — Ambulatory Visit: Payer: 59 | Attending: Nurse Practitioner | Admitting: Nurse Practitioner

## 2024-01-01 VITALS — BP 142/84 | Ht 67.0 in | Wt 338.0 lb

## 2024-01-01 DIAGNOSIS — I739 Peripheral vascular disease, unspecified: Secondary | ICD-10-CM

## 2024-01-01 DIAGNOSIS — E785 Hyperlipidemia, unspecified: Secondary | ICD-10-CM

## 2024-01-01 DIAGNOSIS — I5032 Chronic diastolic (congestive) heart failure: Secondary | ICD-10-CM | POA: Diagnosis not present

## 2024-01-01 DIAGNOSIS — Z79899 Other long term (current) drug therapy: Secondary | ICD-10-CM

## 2024-01-01 DIAGNOSIS — I10 Essential (primary) hypertension: Secondary | ICD-10-CM

## 2024-01-01 DIAGNOSIS — J449 Chronic obstructive pulmonary disease, unspecified: Secondary | ICD-10-CM

## 2024-01-01 DIAGNOSIS — N186 End stage renal disease: Secondary | ICD-10-CM

## 2024-01-01 MED ORDER — TORSEMIDE 20 MG PO TABS: 40.0000 mg | ORAL_TABLET | ORAL | 6 refills | Status: DC

## 2024-01-01 MED ORDER — AMLODIPINE BESYLATE 10 MG PO TABS: 10.0000 mg | ORAL_TABLET | Freq: Every day | ORAL | 1 refills | Status: DC

## 2024-01-01 NOTE — Patient Instructions (Addendum)
 Medication Instructions:  Your physician has recommended you make the following change in your medication:  Change torsemide to 40 mg daily on dialysis days and 40 mg twice daily on non-dialysis days Increase amlodipine to 10 mg daily Continue all other medications as prescribed  Labwork: BMET, BNP, & Mg in 1-2 weeks (01/08/2024) Non-fasting [facility]  Testing/Procedures: none  Follow-Up: Your physician recommends that you schedule a follow-up appointment in: 8 weeks Your physician recommends that you schedule a follow-up appointment in: 3 weeks for a nurse visit  Any Other Special Instructions Will Be Listed Below (If Applicable). HEART FAILURE INSTRUCTION SHEET  Follow a low-salt diet-you are allowed no more than 2,000 mg of sodium per day. Watch your fluid intake. In general, you should not be taking more than 64 ounces a day (no more than 8 glasses per day). Sometimes we refer to this as "2 liters per day." This includes sources of water in food like soup, coffee, tea, milk etc. Weigh yourself on the same scale at the same time of the day preferably immediately after your first void. Keep a log of your weights. Call your doctor: (Anytime you feel any of the following symptoms)  3 lbs weight gain overnight or 5 lbs within a week Shortness of breath, with or without a day hacking cough Swelling in hands, feet or stomach If you have to sleep on extra pillows at night in order to breathe   IT IS IMPORTANT TO LET YOUR DOCTOR KNOW EARLY ON IF YOU ARE HAVING SYMPTOMS SO WE CAN HELP YOU!  Your physician has requested that you regularly monitor and record your blood pressure readings at home. Please use the same machine at the same time of day to check your readings and record them to bring to your follow-up visit.   If you need a refill on your cardiac medications before your next appointment, please call your pharmacy.

## 2024-01-01 NOTE — Progress Notes (Signed)
 Cardiology Office Note:  .   Date: 01/01/2024 ID:  Kelly Key, DOB August 07, 1971, MRN [id number] PCP: Pearson Grippe, MD  [facility] Providers Cardiologist:  Dina Rich, MD    History of Present Illness: .   Kelly Key is a 53 y.o. male with a PMH of HFpEF, hypertension, type 2 diabetes, chronic IDA, ESRD on hemodialysis (Tuesday, Thursday, Saturday schedule), HLD, COPD, and morbid obesity, who presents today for scheduled follow-up.  Previous cardiovascular history includes NSTEMI in 2022. Cardiac catheterization revealed 60% proximal to mid RCA, eccentric 40% PDA distal stenosis.  Ostial LAD 40% stenosis.  Minimal disease in distal left main with widely patent circumflex.  Medical management recommended.  He has not been seen by outpatient cardiology since 2022.  Past history of left SFA stenting to heal left second toe amputation.  Followed by [facility].  Within the past year he has had several ED visits and hospital admissions.  Seen at [facility] ED on September 24, 2023 after a fall.  Said he fell out of bed and hit his head, denied LOC.  He was advised to go to the ED to have a CT of his head performed.  Imaging was negative for anything acute.  Most recently he has was hospitalized in December 2024 for due to acute hypoxic respiratory failure that was likely secondary to combined effect from acute COPD exacerbation as well as acute on chronic HFpEF decompensation.  11/27/2023 - Today he presents for follow-up with his wife.  He tells me he has had issues with fluid buildup recently.  Says he gets short of breath when this occurs.  Does admit to low BPs happening at times with low SBP reported to be in 60s, says he has to be careful with how he positions his feet.  I do not have BP log to evaluate his BP trends recently. Denies any chest pain, palpitations, syncope, presyncope, dizziness, orthopnea, PND, acute bleeding, or claudication.  01/01/2024 -presents for follow-up today  with his wife.  Presents his home BP log that reveals labile BPs.  Majority of readings are not at goal, SBP averaging 140s to 160s.  Does state that he takes his fluid medication after dialysis as sometimes his BP drops after hemodialysis treatments.  No episodes of hypotension noted on BP log. Goes to HD on Tuesdays, Thursdays, and Saturdays.  Weight log is also presented today and overall reveals stable weights.  Per our records, his weight is only down around 1 pound from last office visit. Denies any chest pain, worsening shortness of breath, palpitations, syncope, presyncope, dizziness, orthopnea, PND, acute bleeding, or claudication.   ROS: Negative.  See HPI.  Studies Reviewed: .    Echo 12/2022:  1. Left ventricular ejection fraction, by estimation, is 60 to 65%. The  left ventricle has normal function. The left ventricle has no regional  wall motion abnormalities. There is moderate left ventricular hypertrophy.  Left ventricular diastolic  parameters are consistent with Grade II diastolic dysfunction  (pseudonormalization).   2. Right ventricular systolic function is normal. The right ventricular  size is normal. There is normal pulmonary artery systolic pressure. The  estimated right ventricular systolic pressure is 27.8 mmHg.   3. Left atrial size was severely dilated.   4. The mitral valve is abnormal. Trivial mitral valve regurgitation. No  evidence of mitral stenosis. Moderate to severe mitral annular  calcification.   5. The aortic valve is tricuspid. There is mild calcification of the  aortic valve. Aortic valve regurgitation is not visualized. Mild aortic  valve stenosis. Aortic valve mean gradient measures 12.0 mmHg. Aortic  valve Vmax measures 2.34 m/s.   6. Aortic dilatation noted. There is moderate dilatation of the aortic  root, measuring 42 mm. There is moderate dilatation of the ascending  aorta, measuring 44 mm.   7. The inferior vena cava is dilated in size with  <50% respiratory  variability, suggesting right atrial pressure of 15 mmHg.   Comparison(s): No significant change from prior study.  LHC 05/2021:    60% proximal to mid RCA, eccentric 40% PDA distal stenosis.   Less than 25% distal left main.   Ostial LAD with eccentric 40% stenosis.   Widely patent circumflex   Upper normal LV end-diastolic pressure.  Contrast ventriculography was not performed.   Aggressive risk factor modification including tight blood pressure control, LDL less than 70 and preferably less than 55, weight loss, evaluation for sleep apnea, and diabetes control. High risk for future cardiac events. Does not have critical coronary disease and could be a candidate for kidney transplantation with risk factor modification going forward.   Physical Exam:   VS:  BP (!) 142/84 (BP Location: Right Arm, Patient Position: Sitting, Cuff Size: Large)   Ht 5\' 7"  (1.702 m)   Wt (!) 338 lb (153.3 kg)   SpO2 93%   BMI 52.94 kg/m    Wt Readings from Last 3 Encounters:  01/01/24 (!) 338 lb (153.3 kg)  11/27/23 (!) 339 lb 6.4 oz (154 kg)  10/26/23 (!) 310 lb (140.6 kg)    GEN: Morbidly obese, 53 year old male in no acute distress NECK: No JVD; No carotid bruits CARDIAC: S1/S2, RRR, no murmurs, rubs, gallops RESPIRATORY:  Clear to auscultation without rales, wheezing or rhonchi  ABDOMEN: Soft, non-tender, non-distended EXTREMITIES:  No significant edema to BLE; No deformity   ASSESSMENT AND PLAN: .    Chronic diastolic CHF, medication management Stage C, NYHA class II-III symptoms. EF 60-65% 12/2022. Has had issues of volume overload and heart failure exacerbations for the past couple of months.  Weight is overall stable.  Per our records, weight has not declined significantly since last office visit.  Instructed him to take torsemide 40 mg daily on dialysis days and to take torsemide 40 mg twice daily on nondialysis days.  Will obtain BMET, magnesium, proBNP in 1 to 2 weeks.  Continue carvedilol. Low sodium diet, fluid restriction <2L, and daily weights encouraged. Educated to contact our office for weight gain of 2 lbs overnight or 5 lbs in one week.  GDMT is limited due to hx of ESRD. Care and ED precautions discussed.  Hypertension Blood pressure elevated today, with majority of readings on BP log elevated.  Will increase amlodipine to 10 mg daily. No other medication changes at this time. Discussed to monitor BP at home at least 2 hours after medications and sitting for 5-10 minutes.  Also discussed to log BP trends before and after hemodialysis.  Will bring him back in 2 to 3 weeks for nurse visit for BP check.  GDMT limited due to his ESRD and past medication intolerances-see allergy list.  Possibly consider Imdur if SBP is not at goal. No medication changes at this time. Care and ED precautions discussed.  PAD, Hyperlipidemia LDL 55 in October 2024.  Has been evaluated in the past for critical limb ischemia with tissue loss of left lower extremity.  Has been followed by [facility].  Underwent angioplasty along  anterior tibial of left lower extremity along with stent of left SFA.  Most recent ABIs normal.  Arterial duplex in November 2024 revealed patent stent without evidence of stenosis of SFA on left side.  Continue current medication regimen.  Continue to follow-up with [facility].  He is scheduled for vascular ultrasound studies at the end of April of this year.  COPD  No concerning issues related to COPD exacerbation at this time.  No medication changes at this time.  Continue follow-up with PCP.  ESRD Continue hemodialysis treatments.  Discussed/recommended logging BP trends before and after dialysis.  He and his wife verbalized understanding.  Morbid obesity Weight loss via diet and exercise encouraged. Discussed the impact being overweight would have on cardiovascular risk.  May need to consider healthy weight and wellness clinic referral in the future after further  diuresis.   Dispo: Follow-up with me/APP in 8 weeks or sooner if any changes.  Signed, Sharlene Dory, NP

## 2024-01-05 ENCOUNTER — Telehealth: Payer: Self-pay | Admitting: Nurse Practitioner

## 2024-01-05 NOTE — Telephone Encounter (Signed)
 Pt c/o medication issue:  1. Name of Medication:   torsemide (DEMADEX) 20 MG tablet    2. How are you currently taking this medication (dosage and times per day)?  Take 2-4 tablets (40-80 mg total) by mouth as directed. Take 40 mg daily on dialysis days. Take 40 mg twice daily on non-dialysis days     3. Are you having a reaction (difficulty breathing--STAT)? No  4. What is your medication issue? Pt's friend wanted to make office aware that he can't get this medication until insurance starts on 01/18/24 so he won't be able to take it until then. Please advise

## 2024-01-07 MED ORDER — FUROSEMIDE 80 MG PO TABS: ORAL_TABLET | ORAL | 1 refills | Status: DC

## 2024-01-07 NOTE — Telephone Encounter (Signed)
 Patient informed and verbalized understanding of plan.

## 2024-01-08 ENCOUNTER — Other Ambulatory Visit (HOSPITAL_COMMUNITY)
Admission: RE | Admit: 2024-01-08 | Discharge: 2024-01-08 | Disposition: A | Discharge status: Home / Self Care | Source: Ambulatory Visit | Attending: Nurse Practitioner | Admitting: Nurse Practitioner

## 2024-01-08 DIAGNOSIS — Z79899 Other long term (current) drug therapy: Secondary | ICD-10-CM | POA: Diagnosis present

## 2024-01-08 DIAGNOSIS — I10 Essential (primary) hypertension: Secondary | ICD-10-CM | POA: Diagnosis present

## 2024-01-08 DIAGNOSIS — I5032 Chronic diastolic (congestive) heart failure: Secondary | ICD-10-CM | POA: Insufficient documentation

## 2024-01-08 DIAGNOSIS — N186 End stage renal disease: Secondary | ICD-10-CM | POA: Diagnosis present

## 2024-01-08 LAB — BASIC METABOLIC PANEL
Anion gap: 13 (ref 5–15)
BUN: 53 mg/dL — ABNORMAL HIGH (ref 6–20)
CO2: 24 mmol/L (ref 22–32)
Calcium: 9.1 mg/dL (ref 8.9–10.3)
Chloride: 94 mmol/L — ABNORMAL LOW (ref 98–111)
Creatinine, Ser: 9.55 mg/dL — ABNORMAL HIGH (ref 0.61–1.24)
GFR, Estimated: 6 mL/min — ABNORMAL LOW (ref >60)
Glucose, Bld: 273 mg/dL — ABNORMAL HIGH (ref 70–99)
Potassium: 3.7 mmol/L (ref 3.5–5.1)
Sodium: 131 mmol/L — ABNORMAL LOW (ref 135–145)

## 2024-01-08 LAB — BRAIN NATRIURETIC PEPTIDE: B Natriuretic Peptide: 143 pg/mL — ABNORMAL HIGH (ref 0.0–100.0)

## 2024-01-08 LAB — MAGNESIUM: Magnesium: 2.3 mg/dL (ref 1.7–2.4)

## 2024-01-12 LAB — EXTERNAL GENERIC LAB PROCEDURE: COLOGUARD: NEGATIVE

## 2024-01-12 LAB — COLOGUARD: COLOGUARD: NEGATIVE

## 2024-01-18 ENCOUNTER — Ambulatory Visit

## 2024-01-22 ENCOUNTER — Ambulatory Visit: Attending: Cardiology | Admitting: *Deleted

## 2024-01-22 ENCOUNTER — Encounter: Payer: Self-pay | Admitting: Nurse Practitioner

## 2024-01-22 VITALS — BP 138/80 | HR 86

## 2024-01-22 DIAGNOSIS — I10 Essential (primary) hypertension: Secondary | ICD-10-CM

## 2024-01-22 NOTE — Progress Notes (Signed)
 Patient in office for nurse bp check -  BP 138/80  86 .[name]  Took his morning meds around 7 am.    See weight & BP log scanned into epic

## 2024-01-26 NOTE — Progress Notes (Signed)
 [facility] - Clinic Note  01/27/2024   CHIEF COMPLAINT Patient presents for Retina Follow Up  HISTORY OF PRESENT ILLNESS: Kelly Key is a 53 y.o. male who presents to the clinic today for:  HPI     Retina Follow Up   Patient presents with  Diabetic Retinopathy.  In both eyes.  Severity is moderate.  Duration of 8 weeks.  Since onset it is stable.  I, the attending physician,  performed the HPI with the patient and updated documentation appropriately.        Comments   8 week Retina eval. Patient states vision seems the same. Blood sugar 220      Last edited by Rennis Chris, MD on 01/27/2024  1:09 PM.    Patient feels like the vision is more blurry in his right eye than his left  Referring physician: Pearson Grippe, MD [address] [place],  [place] [address]  HISTORICAL INFORMATION:  Selected notes from the MEDICAL RECORD NUMBER Referred by Dr. Einar Pheasant ([facility]) for diabetic retinopathy LEE:  Ocular Hx- PMH-   CURRENT MEDICATIONS: No current outpatient medications on file. (Ophthalmic Drugs)   No current facility-administered medications for this visit. (Ophthalmic Drugs)   Current Outpatient Medications (Other)  Medication Sig   acetaminophen (TYLENOL) 500 MG tablet Take 1,000 mg by mouth every 6 (six) hours as needed for moderate pain or headache.   albuterol (VENTOLIN HFA) 108 (90 Base) MCG/ACT inhaler Inhale 2 puffs into the lungs every 4 (four) hours as needed for wheezing or shortness of breath.   AMBULATORY NON FORMULARY MEDICATION Inject 0.2 ml by intracavernosal route as directed.  Medication Name: TriMix PGE 30 mcg Pap   30 mg Phent 1 mg   amLODipine (NORVASC) 10 MG tablet Take 1 tablet (10 mg total) by mouth daily.   aspirin EC 81 MG tablet Take 1 tablet (81 mg total) by mouth daily.   atorvastatin (LIPITOR) 80 MG tablet Take 1 tablet (80 mg total) by mouth daily.   carvedilol (COREG) 25 MG tablet Take 1  tablet (25 mg total) by mouth 2 (two) times daily with a meal.   cloNIDine (CATAPRES) 0.3 MG tablet Take 1 tablet (0.3 mg total) by mouth 3 (three) times daily.   clopidogrel (PLAVIX) 75 MG tablet Take 75 mg by mouth daily.   esomeprazole (NEXIUM) 20 MG capsule Take 20 mg by mouth daily.   ezetimibe (ZETIA) 10 MG tablet Take 10 mg by mouth every evening.   fluticasone (FLONASE) 50 MCG/ACT nasal spray Place 1 spray into both nostrils daily as needed for allergies or rhinitis.   furosemide (LASIX) 80 MG tablet Take (1) 80 Mg tablet daily on dialysis days, and take (1) 80 mg tablet twice daily on non dialysis days.   gabapentin (NEURONTIN) 100 MG capsule Take 1 capsule (100 mg total) by mouth 3 (three) times daily.   glucose blood (ACCU-CHEK AVIVA PLUS) test strip Use as instructed TO CHECK BLOOD GLUCOSE THREE TIMES DAILY   HUMALOG KWIKPEN 100 UNIT/ML KwikPen Inject 8-30 Units into the skin 3 (three) times daily. Sliding scale 30 units max TID   insulin detemir (LEVEMIR FLEXTOUCH) 100 UNIT/ML FlexPen Inject 50 Units into the skin at bedtime.   Insulin Pen Needle (B-D ULTRAFINE III SHORT PEN) 31G X 8 MM MISC 1 each by Does not apply route as directed.   linaclotide (LINZESS) 145 MCG CAPS capsule Take 145 mcg by mouth daily before breakfast.  sevelamer carbonate (RENVELA) 800 MG tablet Take 3 tablets (2,400 mg total) by mouth 3 (three) times daily with meals. (Patient taking differently: Take 2,400 mg by mouth 3 (three) times daily with meals. And once daily with snacks)   VELPHORO 500 MG chewable tablet Chew 500 mg by mouth 3 (three) times daily.   allopurinol (ZYLOPRIM) 100 MG tablet Take 2 tablets (200 mg total) by mouth daily.   ferrous sulfate 325 (65 FE) MG tablet Take 1 tablet (325 mg total) by mouth 2 (two) times daily with a meal.   Ipratropium-Albuterol (COMBIVENT) 20-100 MCG/ACT AERS respimat Inhale 1 puff into the lungs every 6 (six) hours as needed for wheezing or shortness of breath.    Current Facility-Administered Medications (Other)  Medication Route   0.9 %  sodium chloride infusion Intravenous   REVIEW OF SYSTEMS: ROS   Positive for: Gastrointestinal, Endocrine, Eyes, Respiratory Negative for: Constitutional, Neurological, Skin, Genitourinary, Musculoskeletal, HENT, Cardiovascular, Psychiatric, Allergic/Imm, Heme/Lymph Last edited by Lana Fish, COT on 01/27/2024  9:34 AM.       ALLERGIES Allergies  Allergen Reactions   Ozempic (0.25 Or 0.5 Mg-Dose) [Semaglutide(0.25 Or 0.5mg -Dos)] Shortness Of Breath   Hydralazine Anxiety    Hallucinations    Lisinopril Swelling   PAST MEDICAL HISTORY Past Medical History:  Diagnosis Date   Acute diastolic CHF (congestive heart failure) (HCC) 06/11/2012   EF 50-55% [facility])   Chronic kidney disease    COPD (chronic obstructive pulmonary disease) (HCC)    Diabetes mellitus    A1c 11.5 (06/11/2012).   Dyspnea    Gout    Hepatic steatosis 06/11/2012   Elevated LFTs   Hyperlipemia    Malignant hypertension    Microcytic anemia 06/12/2012   Obesity    Past Surgical History:  Procedure Laterality Date   ABDOMINAL AORTOGRAM W/LOWER EXTREMITY N/A 12/01/2022   Procedure: ABDOMINAL AORTOGRAM W/LOWER EXTREMITY;  Surgeon: Cephus Shelling, MD;  Location: [facility] INVASIVE CV LAB;  Service: Cardiovascular;  Laterality: N/A;   AMPUTATION Left 12/03/2022   Procedure: LEFT SECOND TOE AMPUTATION;  Surgeon: Nadara Mustard, MD;  Location: [facility] OR;  Service: Orthopedics;  Laterality: Left;   AV FISTULA PLACEMENT Left 11/11/2019   Procedure: ARTERIOVENOUS (AV) FISTULA CREATION LEFT BRACHIOCEPHALIC ARM;  Surgeon: Nada Libman, MD;  Location: [facility] OR;  Service: Vascular;  Laterality: Left;   COLONOSCOPY WITH PROPOFOL N/A 09/02/2021   Procedure: COLONOSCOPY WITH PROPOFOL;  Surgeon: Wyline Mood, MD;  Location: [facility] ENDOSCOPY;  Service: Gastroenterology;  Laterality: N/A;   IR FLUORO GUIDE CV LINE RIGHT  07/08/2019   IR REMOVAL TUN  CV CATH W/O FL  03/23/2020   IR US GUIDE VASC ACCESS RIGHT  07/08/2019   LEFT HEART CATH AND CORONARY ANGIOGRAPHY N/A 06/12/2021   Procedure: LEFT HEART CATH AND CORONARY ANGIOGRAPHY;  Surgeon: Lyn Records, MD;  Location: [facility] INVASIVE CV LAB;  Service: Cardiovascular;  Laterality: N/A;   NO PAST SURGERIES     PERIPHERAL VASCULAR BALLOON ANGIOPLASTY Left 12/01/2022   Procedure: PERIPHERAL VASCULAR BALLOON ANGIOPLASTY;  Surgeon: Cephus Shelling, MD;  Location: [facility] INVASIVE CV LAB;  Service: Cardiovascular;  Laterality: Left;  Anterior tibial   PERIPHERAL VASCULAR INTERVENTION Left 12/01/2022   Procedure: PERIPHERAL VASCULAR INTERVENTION;  Surgeon: Cephus Shelling, MD;  Location: [facility] INVASIVE CV LAB;  Service: Cardiovascular;  Laterality: Left;  sfa   VASCULAR SURGERY     FAMILY HISTORY Family History  Problem Relation Age of Onset   Gout Mother  Asthma Mother    Diabetes Father    Heart failure Father    Diabetes Sister    Hypertension Brother    Pancreatic cancer Brother    Diabetes Sister    SOCIAL HISTORY Social History   Tobacco Use   Smoking status: Never    Passive exposure: Past   Smokeless tobacco: Never  Vaping Use   Vaping status: Never Used  Substance Use Topics   Alcohol use: Not Currently    Comment: haven't drank in over 5 years    Drug use: No       OPHTHALMIC EXAM:  Base Eye Exam     Visual Acuity (Snellen - Linear)       Right Left   Dist [place] 20/30 20/20   Dist ph [place] 20/25          Tonometry (Tonopen, 10:44 AM)       Right Left   Pressure 15 14         Neuro/Psych     Oriented x3: Yes   Mood/Affect: Normal           Slit Lamp and Fundus Exam     External Exam       Right Left   External Normal Normal         Slit Lamp Exam       Right Left   Lids/Lashes Dermatochalasis - upper lid, mild MGD Dermatochalasis - upper lid   Conjunctiva/Sclera Mild Melanosis Mild Melanosis   Cornea Arcus arcus, tear film debris    Anterior Chamber Deep and clear Deep and clear   Iris No NVI, Round and dilated No NVI, Round and dilated   Lens 2+ Nuclear sclerosis, 2-3+ Cortical cataract 2+ Nuclear sclerosis, 2-3+ Cortical cataract   Anterior Vitreous Vitreous syneresis Vitreous syneresis         Fundus Exam       Right Left   Disc Pink and sharp, mild PPA Pink and sharp, mild PPA   C/D Ratio 0.6 0.5   Macula Flat, good foveal reflex, central cystic changes -- stably improved, scattered MA/DBH greatest temporal macula -- improving Flat, Blunted foveal reflex, central cystic changes -- improved, scattered MA/DBH, good focal laser changes   Vessels attenuated, Tortuous attenuated, Tortuous   Periphery Attached, scattered MA/DBH Attached, scattered MA/DBH           IMAGING AND PROCEDURES  Imaging and Procedures for 01/27/2024  OCT, Retina - OU - Both Eyes       Right Eye Quality was good. Central Foveal Thickness: 266. Progression has been stable. Findings include normal foveal contour, no IRF, no SRF, intraretinal hyper-reflective material, vitreomacular adhesion (Stable improvement in central IRF and SRF, scattered punctate IRHM - improved, no fluid).   Left Eye Quality was good. Central Foveal Thickness: 278. Progression has been stable. Findings include normal foveal contour, no IRF, no SRF, intraretinal hyper-reflective material, vitreomacular adhesion (stable improvement in IRF/cystic changes IT fovea and macula).   Notes *Images captured and stored on drive  Diagnosis / Impression:  OD: Stable improvement in central IRF ans SRF, scattered punctate IRHM -- improved, no fluid OS: stable improvement in IRF/cystic changes IT fovea and macula  Clinical management:  See below  Abbreviations: NFP - Normal foveal profile. CME - cystoid macular edema. PED - pigment epithelial detachment. IRF - intraretinal fluid. SRF - subretinal fluid. EZ - ellipsoid zone. ERM - epiretinal membrane. ORA - outer retinal  atrophy. ORT - outer retinal tubulation.  SRHM - subretinal hyper-reflective material. IRHM - intraretinal hyper-reflective material            ASSESSMENT/PLAN:   ICD-10-CM   1. Severe nonproliferative diabetic retinopathy of both eyes with macular edema associated with type 2 diabetes mellitus (HCC)  E11.3413 OCT, Retina - OU - Both Eyes    2. Encounter for long-term (current) use of insulin (HCC)  Z79.4     3. Essential hypertension  I10     4. Hypertensive retinopathy of both eyes  H35.033     5. Cortical age-related cataract of both eyes  H25.013     6. Anxiety  F41.9      \1,2. Severe Non-proliferative diabetic retinopathy, both eyes - last A1c 9.5 (11.26.24); 7.4 (12.23.23) - s/p IVA OD #1 (08.28.24), #2 (09.25.24), #3 (10.23.24) - s/p focal laser OS (01.08.25) - FA (08.28.24) OD: Mild patches of capillary non perfusion peripherally, Late leaking MA, hyperfluorescence of disc, focal perifoveal hyperfluorescence leakage, no NV, OS: Mild patches of capillary non perfusion peripherally, Late leaking MA, no NV - BCVA OD 20/25 from 20/30, OS 20/20 - stable - OCT shows OD: Stable improvement in central IRF and SRF, scattered punctate IRHM -- improved, no fluid at 3+ mos since last IVA; OS: stable improvement in IRF/cystic changes IT fovea and macula - exam shows scattered MAs OS -- improved post focal laser - recommend holding IVA OD due to stable improvements in exam and OCT -- pt in agreement - no tx OS - f/u in 4 mos -- DFE/OCT, possible injection  3,4. Hypertensive retinopathy OU - discussed importance of tight BP control - monitor   5.  Mixed Cataract OU - The symptoms of cataract, surgical options, and treatments and risks were discussed with patient. - discussed diagnosis and progression - monitor   6. Anxiety  - affecting ophthalmic examinations and treatments - may benefit from mild anxiolytic prior to treatment visits (intravitreal injections and lasers)  -  recommend discussing with primary care provider  Ophthalmic Meds Ordered this visit:  No orders of the defined types were placed in this encounter.    Return in about 4 months (around 05/28/2024) for f/u NPDR OU, DFE, OCT.  There are no Patient Instructions on file for this visit.  Explained the diagnoses, plan, and follow up with the patient and they expressed understanding.  Patient expressed understanding of the importance of proper follow up care.   This document serves as a record of services personally performed by Karie Chimera, MD, PhD. It was created on their behalf by Charlette Caffey, COT an ophthalmic technician. The creation of this record is the provider's dictation and/or activities during the visit.    Electronically signed by:  Charlette Caffey, COT  01/27/24 1:12 PM  This document serves as a record of services personally performed by Karie Chimera, MD, PhD. It was created on their behalf by Glee Arvin. Manson Passey, OA an ophthalmic technician. The creation of this record is the provider's dictation and/or activities during the visit.    Electronically signed by: Glee Arvin. Manson Passey, OA 01/27/24 1:12 PM  Karie Chimera, M.D., Ph.D. Diseases & Surgery of the Retina and Vitreous [facility] 01/27/2024  I have reviewed the above documentation for accuracy and completeness, and I agree with the above. Karie Chimera, M.D., Ph.D. 01/27/24 1:17 PM   Abbreviations: M myopia (nearsighted); A astigmatism; H hyperopia (farsighted); P presbyopia; Mrx spectacle prescription;  CTL contact lenses;  OD right eye; OS left eye; OU both eyes  XT exotropia; ET esotropia; PEK punctate epithelial keratitis; PEE punctate epithelial erosions; DES dry eye syndrome; MGD meibomian gland dysfunction; ATs artificial tears; PFAT's preservative free artificial tears; NSC nuclear sclerotic cataract; PSC posterior subcapsular cataract; ERM epi-retinal membrane; PVD posterior vitreous  detachment; RD retinal detachment; DM diabetes mellitus; DR diabetic retinopathy; NPDR non-proliferative diabetic retinopathy; PDR proliferative diabetic retinopathy; CSME clinically significant macular edema; DME diabetic macular edema; dbh dot blot hemorrhages; CWS cotton wool spot; POAG primary open angle glaucoma; C/D cup-to-disc ratio; HVF humphrey visual field; GVF goldmann visual field; OCT optical coherence tomography; IOP intraocular pressure; BRVO Branch retinal vein occlusion; CRVO central retinal vein occlusion; CRAO central retinal artery occlusion; BRAO branch retinal artery occlusion; RT retinal tear; SB scleral buckle; PPV pars plana vitrectomy; VH Vitreous hemorrhage; PRP panretinal laser photocoagulation; IVK intravitreal kenalog; VMT vitreomacular traction; MH Macular hole;  NVD neovascularization of the disc; NVE neovascularization elsewhere; AREDS age related eye disease study; ARMD age related macular degeneration; POAG primary open angle glaucoma; EBMD epithelial/anterior basement membrane dystrophy; ACIOL anterior chamber intraocular lens; IOL intraocular lens; PCIOL posterior chamber intraocular lens; Phaco/IOL phacoemulsification with intraocular lens placement; PRK photorefractive keratectomy; LASIK laser assisted in situ keratomileusis; HTN hypertension; DM diabetes mellitus; COPD chronic obstructive pulmonary disease

## 2024-01-26 NOTE — Progress Notes (Signed)
Patient notified via detailed voice message.

## 2024-01-27 ENCOUNTER — Encounter (INDEPENDENT_AMBULATORY_CARE_PROVIDER_SITE_OTHER): Payer: Self-pay | Admitting: Ophthalmology

## 2024-01-27 ENCOUNTER — Ambulatory Visit (INDEPENDENT_AMBULATORY_CARE_PROVIDER_SITE_OTHER): Payer: 59 | Admitting: Ophthalmology

## 2024-01-27 DIAGNOSIS — F419 Anxiety disorder, unspecified: Secondary | ICD-10-CM

## 2024-01-27 DIAGNOSIS — Z794 Long term (current) use of insulin: Secondary | ICD-10-CM | POA: Diagnosis not present

## 2024-01-27 DIAGNOSIS — E113413 Type 2 diabetes mellitus with severe nonproliferative diabetic retinopathy with macular edema, bilateral: Secondary | ICD-10-CM

## 2024-01-27 DIAGNOSIS — I10 Essential (primary) hypertension: Secondary | ICD-10-CM | POA: Diagnosis not present

## 2024-01-27 DIAGNOSIS — H35033 Hypertensive retinopathy, bilateral: Secondary | ICD-10-CM | POA: Diagnosis not present

## 2024-01-27 DIAGNOSIS — H25013 Cortical age-related cataract, bilateral: Secondary | ICD-10-CM

## 2024-02-24 ENCOUNTER — Ambulatory Visit (HOSPITAL_COMMUNITY)
Admission: RE | Admit: 2024-02-24 | Discharge: 2024-02-24 | Disposition: A | Discharge status: Home / Self Care | Payer: 59 | Source: Ambulatory Visit | Attending: Vascular Surgery | Admitting: Vascular Surgery

## 2024-02-24 ENCOUNTER — Ambulatory Visit (HOSPITAL_BASED_OUTPATIENT_CLINIC_OR_DEPARTMENT_OTHER)
Admission: RE | Admit: 2024-02-24 | Discharge: 2024-02-24 | Disposition: A | Discharge status: Home / Self Care | Payer: 59 | Source: Ambulatory Visit | Attending: Vascular Surgery | Admitting: Vascular Surgery

## 2024-02-24 ENCOUNTER — Ambulatory Visit: Payer: 59 | Attending: Vascular Surgery | Admitting: Vascular Surgery

## 2024-02-24 ENCOUNTER — Encounter: Payer: Self-pay | Admitting: Vascular Surgery

## 2024-02-24 VITALS — BP 143/86 | HR 71 | Temp 98.0°F | Ht 67.0 in | Wt 340.0 lb

## 2024-02-24 DIAGNOSIS — N186 End stage renal disease: Secondary | ICD-10-CM | POA: Diagnosis not present

## 2024-02-24 DIAGNOSIS — I739 Peripheral vascular disease, unspecified: Secondary | ICD-10-CM | POA: Diagnosis present

## 2024-02-24 LAB — VAS US ABI WITH/WO TBI
Left ABI: 1
Right ABI: 0.99

## 2024-02-24 NOTE — Progress Notes (Signed)
 Patient ID: Kelly Key, male   DOB: 08-25-1971, 53 y.o.   MRN: [id number]  Reason for Consult: Follow-up   Referred by Vanita Gens, MD  Subjective:     HPI:  Kelly Key is a 53 y.o. male history of revascularization of left lower extremity to heal secondary potation which is now well-healed.  He remains on dialysis Tuesdays, Thursdays and Saturdays via left upper extremity fistula.  He has no complaints related to today's visit.  He remains on aspirin , Plavix  and a statin.  Past Medical History:  Diagnosis Date   Acute diastolic CHF (congestive heart failure) (HCC) 06/11/2012   EF 50-55% [facility])   Chronic kidney disease    COPD (chronic obstructive pulmonary disease) (HCC)    Diabetes mellitus    A1c 11.5 (06/11/2012).   Dyspnea    Gout    Hepatic steatosis 06/11/2012   Elevated LFTs   Hyperlipemia    Malignant hypertension    Microcytic anemia 06/12/2012   Obesity    Family History  Problem Relation Age of Onset   Gout Mother    Asthma Mother    Diabetes Father    Heart failure Father    Diabetes Sister    Hypertension Brother    Pancreatic cancer Brother    Diabetes Sister    Past Surgical History:  Procedure Laterality Date   ABDOMINAL AORTOGRAM W/LOWER EXTREMITY N/A 12/01/2022   Procedure: ABDOMINAL AORTOGRAM W/LOWER EXTREMITY;  Surgeon: Young Hensen, MD;  Location: [facility] IN[facility];  Service: Cardiovascular;  Laterality: N/A;   AMPUTATION Left 12/03/2022   Procedure: LEFT SECOND TOE AMPUTATION;  Surgeon: Timothy Ford, MD;  Location: [facility] OR;  Service: Orthopedics;  Laterality: Left;   AV FISTULA PLACEMENT Left 11/11/2019   Procedure: ARTERIOVENOUS (AV) FISTULA CREATION LEFT BRACHIOCEPHALIC ARM;  Surgeon: Margherita Shell, MD;  Location: [facility] OR;  Service: Vascular;  Laterality: Left;   COLONOSCOPY WITH PROPOFOL  N/A 09/02/2021   Procedure: COLONOSCOPY WITH PROPOFOL ;  Surgeon: Luke Salaam, MD;  Location: [facility] ENDOSCOPY;  Service: Gastroenterology;   Laterality: N/A;   IR FLUORO GUIDE CV LINE RIGHT  07/08/2019   IR REMOVAL TUN CV CATH W/O FL  03/23/2020   IR US  GUIDE VASC ACCESS RIGHT  07/08/2019   LEFT HEART CATH AND CORONARY ANGIOGRAPHY N/A 06/12/2021   Procedure: LEFT HEART CATH AND CORONARY ANGIOGRAPHY;  Surgeon: Arty Binning, MD;  Location: [facility];  Service: Cardiovascular;  Laterality: N/A;   NO PAST SURGERIES     PERIPHERAL VASCULAR BALLOON ANGIOPLASTY Left 12/01/2022   Procedure: PERIPHERAL VASCULAR BALLOON ANGIOPLASTY;  Surgeon: Young Hensen, MD;  Location: [facility];  Service: Cardiovascular;  Laterality: Left;  Anterior tibial   PERIPHERAL VASCULAR INTERVENTION Left 12/01/2022   Procedure: PERIPHERAL VASCULAR INTERVENTION;  Surgeon: Young Hensen, MD;  Location: [facility];  Service: Cardiovascular;  Laterality: Left;  sfa   VASCULAR SURGERY      Short Social History:  Social History   Tobacco Use   Smoking status: Never    Passive exposure: Past   Smokeless tobacco: Never  Substance Use Topics   Alcohol use: Not Currently    Comment: haven't drank in over 5 years     Allergies  Allergen Reactions   Ozempic  (0.25 Or 0.5 Mg-Dose) [Semaglutide (0.25 Or 0.5mg -Dos)] Shortness Of Breath   Hydralazine  Anxiety    Hallucinations    Lisinopril Swelling    Current Outpatient Medications  Medication  Sig Dispense Refill   acetaminophen  (TYLENOL ) 500 MG tablet Take 1,000 mg by mouth every 6 (six) hours as needed for moderate pain or headache.     albuterol  (VENTOLIN  HFA) 108 (90 Base) MCG/ACT inhaler Inhale 2 puffs into the lungs every 4 (four) hours as needed for wheezing or shortness of breath. 18 g 3   allopurinol  (ZYLOPRIM ) 100 MG tablet Take 2 tablets (200 mg total) by mouth daily. 30 tablet 0   AMBULATORY NON FORMULARY MEDICATION Inject 0.2 ml by intracavernosal route as directed.  Medication Name: TriMix PGE 30 mcg Pap   30 mg Phent 1 mg 5 mL 0   amLODipine  (NORVASC ) 10 MG  tablet Take 1 tablet (10 mg total) by mouth daily. 90 tablet 1   aspirin  EC 81 MG tablet Take 1 tablet (81 mg total) by mouth daily. 30 tablet 4   atorvastatin  (LIPITOR ) 80 MG tablet Take 1 tablet (80 mg total) by mouth daily. 90 tablet 3   carvedilol  (COREG ) 25 MG tablet Take 1 tablet (25 mg total) by mouth 2 (two) times daily with a meal. 60 tablet 5   cloNIDine  (CATAPRES ) 0.3 MG tablet Take 1 tablet (0.3 mg total) by mouth 3 (three) times daily. 90 tablet 3   clopidogrel  (PLAVIX ) 75 MG tablet Take 75 mg by mouth daily.     esomeprazole (NEXIUM) 20 MG capsule Take 20 mg by mouth daily.     ezetimibe  (ZETIA ) 10 MG tablet Take 10 mg by mouth every evening.     fluticasone  (FLONASE ) 50 MCG/ACT nasal spray Place 1 spray into both nostrils daily as needed for allergies or rhinitis.     furosemide  (LASIX ) 80 MG tablet Take (1) 80 Mg tablet daily on dialysis days, and take (1) 80 mg tablet twice daily on non dialysis days. 180 tablet 1   gabapentin  (NEURONTIN ) 100 MG capsule Take 1 capsule (100 mg total) by mouth 3 (three) times daily. 90 capsule 0   glucose blood (ACCU-CHEK AVIVA PLUS) test strip Use as instructed TO CHECK BLOOD GLUCOSE THREE TIMES DAILY 200 each 3   HUMALOG KWIKPEN 100 UNIT/ML KwikPen Inject 8-30 Units into the skin 3 (three) times daily. Sliding scale 30 units max TID     insulin  detemir (LEVEMIR  FLEXTOUCH) 100 UNIT/ML FlexPen Inject 50 Units into the skin at bedtime. 15 mL 3   Insulin  Pen Needle (B-D ULTRAFINE III SHORT PEN) 31G X 8 MM MISC 1 each by Does not apply route as directed. 100 each 3   linaclotide  (LINZESS ) 145 MCG CAPS capsule Take 145 mcg by mouth daily before breakfast.     sevelamer  carbonate (RENVELA ) 800 MG tablet Take 3 tablets (2,400 mg total) by mouth 3 (three) times daily with meals. (Patient taking differently: Take 2,400 mg by mouth 3 (three) times daily with meals. And once daily with snacks) 270 tablet 0   VELPHORO  500 MG chewable tablet Chew 500 mg by mouth  3 (three) times daily.     ferrous sulfate  325 (65 FE) MG tablet Take 1 tablet (325 mg total) by mouth 2 (two) times daily with a meal. 60 tablet 2   Ipratropium-Albuterol  (COMBIVENT ) 20-100 MCG/ACT AERS respimat Inhale 1 puff into the lungs every 6 (six) hours as needed for wheezing or shortness of breath. 1 each 2   Current Facility-Administered Medications  Medication Dose Route Frequency Provider Last Rate Last Admin   0.9 %  sodium chloride  infusion   Intravenous PRN Volney Grumbles, PA-C  Review of Systems  Constitutional:  Constitutional negative. HENT: HENT negative.  Eyes: Eyes negative.  Respiratory: Respiratory negative.  Cardiovascular: Cardiovascular negative.  GI: Gastrointestinal negative.  Musculoskeletal: Musculoskeletal negative.  Skin: Skin negative.  Neurological: Neurological negative. Hematologic: Hematologic/lymphatic negative.  Psychiatric: Psychiatric negative.        Objective:  Objective   Vitals:   02/24/24 1359  BP: (!) 143/86  Pulse: 71  Temp: 98 F (36.7 C)  SpO2: 96%  Weight: (!) 340 lb (154.2 kg)  Height: 5\' 7"  (1.702 m)   Body mass index is 53.25 kg/m.  Physical Exam HENT:     Head: Normocephalic.     Nose: Nose normal.  Eyes:     Pupils: Pupils are equal, round, and reactive to light.  Cardiovascular:     Pulses:          Dorsalis pedis pulses are 1+ on the right side and 1+ on the left side.  Pulmonary:     Effort: Pulmonary effort is normal.  Abdominal:     General: Abdomen is flat.  Musculoskeletal:     Comments: Left upper arm AV fistula with thrill Well-healed left second toe amputation  Neurological:     General: No focal deficit present.     Mental Status: He is alert.  Psychiatric:        Mood and Affect: Mood normal.     Data: +----------+--------+-----+--------+-----------+---------------+  LEFT     PSV cm/sRatioStenosisWaveform   Comments          +----------+--------+-----+--------+-----------+---------------+  CFA Prox  126                  triphasic                   +----------+--------+-----+--------+-----------+---------------+  DFA      153                  biphasic                    +----------+--------+-----+--------+-----------+---------------+  SFA Prox  96                   triphasic  ostium           +----------+--------+-----+--------+-----------+---------------+  SFA Distal102                  triphasic                   +----------+--------+-----+--------+-----------+---------------+  POP Prox  99                   triphasic                   +----------+--------+-----+--------+-----------+---------------+  POP Mid   68                   triphasic                   +----------+--------+-----+--------+-----------+---------------+  POP Distal87                   triphasic                   +----------+--------+-----+--------+-----------+---------------+  TP Trunk  54                   triphasic                   +----------+--------+-----+--------+-----------+---------------+  ATA Prox  62                   triphasic                   +----------+--------+-----+--------+-----------+---------------+  ATA Mid   0            occluded           s/p angioplasty  +----------+--------+-----+--------+-----------+---------------+  ATA Distal34                   multiphasic                 +----------+--------+-----+--------+-----------+---------------+     Left Stent(s):  +----------------------+--------+--------------+-----------+--------+  Proximal to Distal SFAPSV cm/sStenosis      Waveform   Comments  +----------------------+--------+--------------+-----------+--------+  Prox to Stent         197     1-49% stenosistriphasic            +----------------------+--------+--------------+-----------+--------+  Proximal Stent         183                   multiphasic          +----------------------+--------+--------------+-----------+--------+  Mid Stent             116                   triphasic            +----------------------+--------+--------------+-----------+--------+  Distal Stent          85                                         +----------------------+--------+--------------+-----------+--------+  Distal to Stent       95                                         +----------------------+--------+--------------+-----------+--------+        Summary:  Left: Mild progression is noted compared to previous study.   Atherosclerosis throughout.  Patent SFA stent with mildly elevated velocities consistent with 1-49%  stenosis at pre stent segment.  Mid ATA occlusion, s/p angioplasty.   ABI Findings:  +---------+------------------+-----+-----------+--------+  Right   Rt Pressure (mmHg)IndexWaveform   Comment   +---------+------------------+-----+-----------+--------+  Brachial 167                                         +---------+------------------+-----+-----------+--------+  PTA     165               0.99 multiphasic          +---------+------------------+-----+-----------+--------+  DP      143               0.86 multiphasic          +---------+------------------+-----+-----------+--------+  Great Toe107               0.64 Abnormal             +---------+------------------+-----+-----------+--------+   +---------+------------------+-----+-----------+-------+  Left    Lt Pressure (mmHg)IndexWaveform   Comment  +---------+------------------+-----+-----------+-------+  Brachial  AVF      +---------+------------------+-----+-----------+-------+  PTA     158               0.95 multiphasic         +---------+------------------+-----+-----------+-------+  DP      167               1.00 multiphasic          +---------+------------------+-----+-----------+-------+  Great Toe106               0.63 Abnormal            +---------+------------------+-----+-----------+-------+   +-------+-----------+-----------+------------+------------+  ABI/TBIToday's ABIToday's TBIPrevious ABIPrevious TBI  +-------+-----------+-----------+------------+------------+  Right .99        .64        1.07        .92           +-------+-----------+-----------+------------+------------+  Left  1.00       .63        1.22        .87           +-------+-----------+-----------+------------+------------+       Bilateral ABIs appear essentially unchanged compared to prior study on  09/02/2023. Bilateral TBIs appear decreased compared to prior study on  09/02/2023.    Summary:  Right: Resting right ankle-brachial index is within normal range. The  right toe-brachial index is abnormal.   Left: Resting left ankle-brachial index is within normal range. The left  toe-brachial index is abnormal.        Assessment/Plan:    53 year old male status post left lower extremity revascularization to heal a left second toe amputation which is now well-healed.  Toe pressure and ABIs are preserved with palpable dorsalis pedis pulses bilaterally.  Follow-up in 1 year with repeat noninvasive studies.      Adine Hoof MD [facility]

## 2024-02-29 ENCOUNTER — Encounter: Payer: Self-pay | Admitting: Nurse Practitioner

## 2024-02-29 ENCOUNTER — Ambulatory Visit: Attending: Nurse Practitioner | Admitting: Nurse Practitioner

## 2024-02-29 VITALS — BP 150/86 | HR 81 | Ht 69.0 in | Wt 347.8 lb

## 2024-02-29 DIAGNOSIS — I10 Essential (primary) hypertension: Secondary | ICD-10-CM

## 2024-02-29 DIAGNOSIS — R600 Localized edema: Secondary | ICD-10-CM

## 2024-02-29 DIAGNOSIS — E785 Hyperlipidemia, unspecified: Secondary | ICD-10-CM

## 2024-02-29 DIAGNOSIS — I739 Peripheral vascular disease, unspecified: Secondary | ICD-10-CM | POA: Diagnosis not present

## 2024-02-29 DIAGNOSIS — Z79899 Other long term (current) drug therapy: Secondary | ICD-10-CM

## 2024-02-29 DIAGNOSIS — J449 Chronic obstructive pulmonary disease, unspecified: Secondary | ICD-10-CM

## 2024-02-29 DIAGNOSIS — I5032 Chronic diastolic (congestive) heart failure: Secondary | ICD-10-CM

## 2024-02-29 DIAGNOSIS — I5033 Acute on chronic diastolic (congestive) heart failure: Secondary | ICD-10-CM

## 2024-02-29 DIAGNOSIS — N186 End stage renal disease: Secondary | ICD-10-CM

## 2024-02-29 DIAGNOSIS — E876 Hypokalemia: Secondary | ICD-10-CM

## 2024-02-29 MED ORDER — ISOSORBIDE MONONITRATE ER 30 MG PO TB24: 15.0000 mg | ORAL_TABLET | Freq: Every day | ORAL | 3 refills | Status: DC

## 2024-02-29 MED ORDER — FUROSEMIDE 40 MG PO TABS
ORAL_TABLET | ORAL | 3 refills | Status: DC
Start: 2024-02-29 — End: 2024-04-04

## 2024-02-29 NOTE — Progress Notes (Unsigned)
 Cardiology Office Note:  .   Date: 02/29/2024 ID:  Kelly Key, DOB 12-16-1970, MRN [id number] PCP: Vanita Gens, MD  [facility] Providers Cardiologist:  Armida Lander, MD    History of Present Illness: .   Kelly Key is a 53 y.o. male with a PMH of HFpEF, hypertension, type 2 diabetes, chronic IDA, ESRD on hemodialysis (Tuesday, Thursday, Saturday schedule), HLD, COPD, and morbid obesity, who presents today for scheduled follow-up.  Previous cardiovascular history includes NSTEMI in 2022. Cardiac catheterization revealed 60% proximal to mid RCA, eccentric 40% PDA distal stenosis.  Ostial LAD 40% stenosis.  Minimal disease in distal left main with widely patent circumflex.  Medical management recommended.  He has not been seen by outpatient cardiology since 2022.  Past history of left SFA stenting to heal left second toe amputation.  Followed by [facility].  Within the past year he has had several ED visits and hospital admissions.  Seen at [facility] ED on September 24, 2023 after a fall.  Said he fell out of bed and hit his head, denied LOC.  He was advised to go to the ED to have a CT of his head performed.  Imaging was negative for anything acute.  Most recently he has was hospitalized in December 2024 for due to acute hypoxic respiratory failure that was likely secondary to combined effect from acute COPD exacerbation as well as acute on chronic HFpEF decompensation.  11/27/2023 - Today he presents for follow-up with his wife.  He tells me he has had issues with fluid buildup recently.  Says he gets short of breath when this occurs.  Does admit to low BPs happening at times with low SBP reported to be in 60s, says he has to be careful with how he positions his feet.  I do not have BP log to evaluate his BP trends recently. Denies any chest pain, palpitations, syncope, presyncope, dizziness, orthopnea, PND, acute bleeding, or claudication.  01/01/2024 -presents for follow-up today  with his wife.  Presents his home BP log that reveals labile BPs.  Majority of readings are not at goal, SBP averaging 140s to 160s.  Does state that he takes his fluid medication after dialysis as sometimes his BP drops after hemodialysis treatments.  No episodes of hypotension noted on BP log. Goes to HD on Tuesdays, Thursdays, and Saturdays.  Weight log is also presented today and overall reveals stable weights.  Per our records, his weight is only down around 1 pound from last office visit. Denies any chest pain, worsening shortness of breath, palpitations, syncope, presyncope, dizziness, orthopnea, PND, acute bleeding, or claudication.  02/29/2024 - Presents today for follow-up with wife. Remains compliant with medications. Admits to labile weights. Brings in log from home that shows overall stable weights since last office visit, however, weight is almost up 10 lbs since last office visit. Pt is fatigued during interview today and attributes this to his insulin . BP is overall improved since last office visit, does admit to BP dropping during at times, but rare per his report when this happens, denies any recent issues with his BP per his report. Denies any chest pain, shortness of breath, palpitations, syncope, presyncope, dizziness, orthopnea, PND, significant weight changes, acute bleeding, or claudication.  ROS: Negative.  See HPI.  Studies Reviewed: Aaron Aas    EKG: EKG is not ordered today.   ABI's 02/24/2024:  Summary:  Right: Resting right ankle-brachial index is within normal range. The  right toe-brachial index  is abnormal.   Left: Resting left ankle-brachial index is within normal range. The left  toe-brachial index is abnormal.    *See table(s) above for measurements and observations.  See LE Arterial duplex report.  Lower extremity arterial duplex left 02/24/2024:  Summary:  Left: Mild progression is noted compared to previous study.   Atherosclerosis throughout.  Patent SFA stent  with mildly elevated velocities consistent with 1-49%  stenosis at pre stent segment.  Mid ATA occlusion, s/p angioplasty.     See table(s) above for measurements and observations.  See ABI report.   Suggest Peripheral Vascular Consult.  Echo 12/2022:  1. Left ventricular ejection fraction, by estimation, is 60 to 65%. The  left ventricle has normal function. The left ventricle has no regional  wall motion abnormalities. There is moderate left ventricular hypertrophy.  Left ventricular diastolic  parameters are consistent with Grade II diastolic dysfunction  (pseudonormalization).   2. Right ventricular systolic function is normal. The right ventricular  size is normal. There is normal pulmonary artery systolic pressure. The  estimated right ventricular systolic pressure is 27.8 mmHg.   3. Left atrial size was severely dilated.   4. The mitral valve is abnormal. Trivial mitral valve regurgitation. No  evidence of mitral stenosis. Moderate to severe mitral annular  calcification.   5. The aortic valve is tricuspid. There is mild calcification of the  aortic valve. Aortic valve regurgitation is not visualized. Mild aortic  valve stenosis. Aortic valve mean gradient measures 12.0 mmHg. Aortic  valve Vmax measures 2.34 m/s.   6. Aortic dilatation noted. There is moderate dilatation of the aortic  root, measuring 42 mm. There is moderate dilatation of the ascending  aorta, measuring 44 mm.   7. The inferior vena cava is dilated in size with <50% respiratory  variability, suggesting right atrial pressure of 15 mmHg.   Comparison(s): No significant change from prior study.  LHC 05/2021:    60% proximal to mid RCA, eccentric 40% PDA distal stenosis.   Less than 25% distal left main.   Ostial LAD with eccentric 40% stenosis.   Widely patent circumflex   Upper normal LV end-diastolic pressure.  Contrast ventriculography was not performed.   Aggressive risk factor modification including  tight blood pressure control, LDL less than 70 and preferably less than 55, weight loss, evaluation for sleep apnea, and diabetes control. High risk for future cardiac events. Does not have critical coronary disease and could be a candidate for kidney transplantation with risk factor modification going forward.   Physical Exam:   VS:  BP (!) 150/86   Pulse 81   Ht 5\' 9"  (1.753 m)   Wt (!) 347 lb 12.8 oz (157.8 kg)   SpO2 99%   BMI 51.36 kg/m    Wt Readings from Last 3 Encounters:  02/29/24 (!) 347 lb 12.8 oz (157.8 kg)  02/24/24 (!) 340 lb (154.2 kg)  01/01/24 (!) 338 lb (153.3 kg)    GEN: Morbidly obese, 53 year old male in no acute distress NECK: No JVD; No carotid bruits CARDIAC: S1/S2, RRR, no murmurs, rubs, gallops RESPIRATORY:  Clear to auscultation without rales, wheezing or rhonchi  ABDOMEN: Soft, non-tender, non-distended EXTREMITIES:  No significant edema to BLE; No deformity   ASSESSMENT AND PLAN: .    Chronic diastolic CHF, medication management Stage C, NYHA class II-III symptoms. EF 60-65% 12/2022. Has had issues of volume overload and heart failure exacerbations for the past couple of months.  Weight is overall stable  over time but up about 10 lbs per our scales from last office visit. Could not take Torsemide  d/t insurance.  We will change Lasix  dosage to assist patient and wife. Instructed him to take Lasix  80 mg BID on non dialysis days and to take 2.5 tablets of Lasix  on dialysis days, which is equivalent to 100 mg daily on dialysis days.  Will obtain BMET, magnesium , proBNP in 1 to 2 weeks. Continue carvedilol . Low sodium diet, fluid restriction <2L, and daily weights encouraged. Educated to contact our office for weight gain of 2 lbs overnight or 5 lbs in one week.  GDMT is limited due to hx of ESRD. Care and ED precautions discussed.  Hypertension Blood pressure elevated today, with BP readings overall labile with majority of readings with SBP > 140.  Will begin  Imdur 15 mg in the evenings for better BP control. No other medication changes at this time. Discussed to monitor BP at home at least 2 hours after medications and sitting for 5-10 minutes.  Also discussed to log BP trends before and after hemodialysis. GDMT limited due to his ESRD and past medication intolerances-see allergy list. Care and ED precautions discussed.  PAD, Hyperlipidemia LDL 55 in October 2024.  Has been evaluated in the past for critical limb ischemia with tissue loss of left lower extremity.  Has been followed by [facility].  Underwent angioplasty along anterior tibial of left lower extremity along with stent of left SFA. See most recent vascular studies noted above from April 2015.  Continue current medication regimen.  Continue to follow-up with [facility].   COPD  No concerning issues related to COPD exacerbation at this time.  No medication changes at this time.  Continue follow-up with PCP.  ESRD Continue hemodialysis treatments.  Discussed/recommended logging BP trends before and after dialysis.  He and his wife verbalized understanding.  Morbid obesity Weight loss via diet and exercise encouraged. Discussed the impact being overweight would have on cardiovascular risk.  May need to consider healthy weight and wellness clinic referral in the future after further diuresis.   Dispo: Follow-up with me/APP in 4-6 weeks or sooner if any changes.  Signed, Lasalle Pointer, NP

## 2024-02-29 NOTE — Patient Instructions (Addendum)
 Medication Instructions:  Your physician has recommended you make the following change in your medication:  Please Start Lasix  40 Mg tablets... On Dialysis days you with take 2.5 tablets which is 100 Mg  On Non Dialysis days you will take 2 tablets twice daily which is 80 Mg twice daily  Please start IMDUR 15 Mg daily in the evening   Labwork: In 1-2 weeks at [facility]   Testing/Procedures: None   Follow-Up: Your physician recommends that you schedule a follow-up appointment in: 4-6 weeks   Any Other Special Instructions Will Be Listed Below (If Applicable).   HEART FAILURE INSTRUCTION SHEET  Follow a low-salt diet-you are allowed no more than 2,000 mg of sodium per day. Watch your fluid intake. In general, you should not be taking more than 64 ounces a day (no more than 8 glasses per day). Sometimes we refer to this as "2 liters per day." This includes sources of water in food like soup, coffee, tea, milk etc. Weigh yourself on the same scale at the same time of the day preferably immediately after your first void. Keep a log of your weights. Call your doctor: (Anytime you feel any of the following symptoms)  3 lbs weight gain overnight or 5 lbs within a week Shortness of breath, with or without a day hacking cough Swelling in hands, feet or stomach If you have to sleep on extra pillows at night in order to breathe   IT IS IMPORTANT TO LET YOUR DOCTOR KNOW EARLY ON IF YOU ARE HAVING SYMPTOMS SO WE CAN HELP YOU!  If you need a refill on your cardiac medications before your next appointment, please call your pharmacy.

## 2024-04-03 LAB — BASIC METABOLIC PANEL WITH GFR
BUN/Creatinine Ratio: 6 — ABNORMAL LOW (ref 9–20)
BUN: 49 mg/dL — ABNORMAL HIGH (ref 6–24)
CO2: 23 mmol/L (ref 20–29)
Calcium: 9.5 mg/dL (ref 8.7–10.2)
Chloride: 94 mmol/L — ABNORMAL LOW (ref 96–106)
Creatinine, Ser: 8.64 mg/dL — ABNORMAL HIGH (ref 0.76–1.27)
Glucose: 191 mg/dL — ABNORMAL HIGH (ref 70–99)
Potassium: 4.4 mmol/L (ref 3.5–5.2)
Sodium: 139 mmol/L (ref 134–144)
eGFR: 7 mL/min/{1.73_m2} — ABNORMAL LOW (ref >59)

## 2024-04-03 LAB — MAGNESIUM: Magnesium: 2.3 mg/dL (ref 1.6–2.3)

## 2024-04-03 LAB — BRAIN NATRIURETIC PEPTIDE: BNP: 92.5 pg/mL (ref 0.0–100.0)

## 2024-04-04 ENCOUNTER — Encounter: Payer: Self-pay | Admitting: Nurse Practitioner

## 2024-04-04 ENCOUNTER — Ambulatory Visit: Attending: Nurse Practitioner | Admitting: Nurse Practitioner

## 2024-04-04 VITALS — BP 142/84 | HR 80 | Ht 69.0 in | Wt 342.0 lb

## 2024-04-04 DIAGNOSIS — I10 Essential (primary) hypertension: Secondary | ICD-10-CM

## 2024-04-04 DIAGNOSIS — I5032 Chronic diastolic (congestive) heart failure: Secondary | ICD-10-CM | POA: Diagnosis not present

## 2024-04-04 DIAGNOSIS — N186 End stage renal disease: Secondary | ICD-10-CM

## 2024-04-04 DIAGNOSIS — E785 Hyperlipidemia, unspecified: Secondary | ICD-10-CM | POA: Diagnosis not present

## 2024-04-04 DIAGNOSIS — J449 Chronic obstructive pulmonary disease, unspecified: Secondary | ICD-10-CM

## 2024-04-04 DIAGNOSIS — I739 Peripheral vascular disease, unspecified: Secondary | ICD-10-CM

## 2024-04-04 MED ORDER — ISOSORBIDE MONONITRATE ER 30 MG PO TB24: 15.0000 mg | ORAL_TABLET | Freq: Every day | ORAL | 1 refills | Status: DC

## 2024-04-04 MED ORDER — FUROSEMIDE 40 MG PO TABS: ORAL_TABLET | ORAL | 3 refills | Status: DC

## 2024-04-04 MED ORDER — AMLODIPINE BESYLATE 10 MG PO TABS: 10.0000 mg | ORAL_TABLET | Freq: Every day | ORAL | 1 refills | Status: DC

## 2024-04-04 NOTE — Progress Notes (Unsigned)
 Cardiology Office Note:  .   Date: 04/04/2024 ID:  Kelly Key, DOB 03/19/1971, MRN [id number] PCP: Vanita Gens, MD (Inactive)  [facility] Providers Cardiologist:  Armida Lander, MD    History of Present Illness: .   Kelly Key is a 53 y.o. male with a PMH of HFpEF, hypertension, type 2 diabetes, chronic IDA, ESRD on hemodialysis (Tuesday, Thursday, Saturday schedule), HLD, COPD, and morbid obesity, who presents today for scheduled follow-up.  Previous cardiovascular history includes NSTEMI in 2022. Cardiac catheterization revealed 60% proximal to mid RCA, eccentric 40% PDA distal stenosis.  Ostial LAD 40% stenosis.  Minimal disease in distal left main with widely patent circumflex.  Medical management recommended.  He has not been seen by outpatient cardiology since 2022.  Past history of left SFA stenting to heal left second toe amputation.  Followed by [facility].  Within the past year he has had several ED visits and hospital admissions.  Seen at [facility] ED on September 24, 2023 after a fall.  Said he fell out of bed and hit his head, denied LOC.  He was advised to go to the ED to have a CT of his head performed.  Imaging was negative for anything acute.  Most recently he has was hospitalized in December 2024 for due to acute hypoxic respiratory failure that was likely secondary to combined effect from acute COPD exacerbation as well as acute on chronic HFpEF decompensation.  11/27/2023 - Today he presents for follow-up with his wife.  He tells me he has had issues with fluid buildup recently.  Says he gets short of breath when this occurs.  Does admit to low BPs happening at times with low SBP reported to be in 60s, says he has to be careful with how he positions his feet.  I do not have BP log to evaluate his BP trends recently. Denies any chest pain, palpitations, syncope, presyncope, dizziness, orthopnea, PND, acute bleeding, or claudication.  01/01/2024 -presents for  follow-up today with his wife.  Presents his home BP log that reveals labile BPs.  Majority of readings are not at goal, SBP averaging 140s to 160s.  Does state that he takes his fluid medication after dialysis as sometimes his BP drops after hemodialysis treatments.  No episodes of hypotension noted on BP log. Goes to HD on Tuesdays, Thursdays, and Saturdays.  Weight log is also presented today and overall reveals stable weights.  Per our records, his weight is only down around 1 pound from last office visit. Denies any chest pain, worsening shortness of breath, palpitations, syncope, presyncope, dizziness, orthopnea, PND, acute bleeding, or claudication.  02/29/2024 - Presents today for follow-up with wife. Remains compliant with medications. Admits to labile weights. Brings in log from home that shows overall stable weights since last office visit, however, weight is almost up 10 lbs since last office visit. Pt is fatigued during interview today and attributes this to his insulin . BP is overall improved since last office visit, does admit to BP dropping during at times, but rare per his report when this happens, denies any recent issues with his BP per his report. Denies any chest pain, shortness of breath, palpitations, syncope, presyncope, dizziness, orthopnea, PND, significant weight changes, acute bleeding, or claudication.  04/04/2024 - Here for follow-up. Doing well and weight is down from last office visit. Does have some labile BP readings. Compliant with his medication regimen. Denies any chest pain, shortness of breath, palpitations, syncope, presyncope, dizziness, orthopnea,  PND, swelling or significant weight changes, acute bleeding, or claudication. Took AM medications about 30 minutes ago.   ROS: Negative.  See HPI.  Studies Reviewed: Aaron Aas    EKG: EKG is not ordered today.   ABI's 02/24/2024:  Summary:  Right: Resting right ankle-brachial index is within normal range. The  right toe-brachial  index is abnormal.   Left: Resting left ankle-brachial index is within normal range. The left  toe-brachial index is abnormal.    *See table(s) above for measurements and observations.  See LE Arterial duplex report.  Lower extremity arterial duplex left 02/24/2024:  Summary:  Left: Mild progression is noted compared to previous study.   Atherosclerosis throughout.  Patent SFA stent with mildly elevated velocities consistent with 1-49%  stenosis at pre stent segment.  Mid ATA occlusion, s/p angioplasty.     See table(s) above for measurements and observations.  See ABI report.   Suggest Peripheral Vascular Consult.  Echo 12/2022:  1. Left ventricular ejection fraction, by estimation, is 60 to 65%. The  left ventricle has normal function. The left ventricle has no regional  wall motion abnormalities. There is moderate left ventricular hypertrophy.  Left ventricular diastolic  parameters are consistent with Grade II diastolic dysfunction  (pseudonormalization).   2. Right ventricular systolic function is normal. The right ventricular  size is normal. There is normal pulmonary artery systolic pressure. The  estimated right ventricular systolic pressure is 27.8 mmHg.   3. Left atrial size was severely dilated.   4. The mitral valve is abnormal. Trivial mitral valve regurgitation. No  evidence of mitral stenosis. Moderate to severe mitral annular  calcification.   5. The aortic valve is tricuspid. There is mild calcification of the  aortic valve. Aortic valve regurgitation is not visualized. Mild aortic  valve stenosis. Aortic valve mean gradient measures 12.0 mmHg. Aortic  valve Vmax measures 2.34 m/s.   6. Aortic dilatation noted. There is moderate dilatation of the aortic  root, measuring 42 mm. There is moderate dilatation of the ascending  aorta, measuring 44 mm.   7. The inferior vena cava is dilated in size with <50% respiratory  variability, suggesting right atrial  pressure of 15 mmHg.   Comparison(s): No significant change from prior study.  LHC 05/2021:    60% proximal to mid RCA, eccentric 40% PDA distal stenosis.   Less than 25% distal left main.   Ostial LAD with eccentric 40% stenosis.   Widely patent circumflex   Upper normal LV end-diastolic pressure.  Contrast ventriculography was not performed.   Aggressive risk factor modification including tight blood pressure control, LDL less than 70 and preferably less than 55, weight loss, evaluation for sleep apnea, and diabetes control. High risk for future cardiac events. Does not have critical coronary disease and could be a candidate for kidney transplantation with risk factor modification going forward.   Physical Exam:   VS:  BP (!) 142/84   Pulse 80   Ht 5' 9 (1.753 m)   Wt (!) 342 lb (155.1 kg)   SpO2 94%   BMI 50.50 kg/m    Wt Readings from Last 3 Encounters:  04/04/24 (!) 342 lb (155.1 kg)  02/29/24 (!) 347 lb 12.8 oz (157.8 kg)  02/24/24 (!) 340 lb (154.2 kg)    GEN: Morbidly obese, 53 year old male in no acute distress NECK: No JVD; No carotid bruits CARDIAC: S1/S2, RRR, no murmurs, rubs, gallops RESPIRATORY:  Clear to auscultation without rales, wheezing or rhonchi  ABDOMEN:  Soft, non-tender, non-distended EXTREMITIES:  No significant edema to BLE; No deformity   ASSESSMENT AND PLAN: .    Chronic diastolic CHF Stage C, NYHA class II-III symptoms. EF 60-65% 12/2022. Weight is coming down, recent BNP normal. Could not take Torsemide  d/t insurance. Continue current medication regimen. Low sodium diet, fluid restriction <2L, and daily weights encouraged. Educated to contact our office for weight gain of 2 lbs overnight or 5 lbs in one week.  GDMT is limited due to hx of ESRD. Care and ED precautions discussed.  Hypertension Blood pressure elevated today, with BP readings overall labile. Due to BP dropping at times, will continue to monitor - just took AM medications about 30  minutes ago. More recent readings on log well controlled overall. No other medication changes at this time. Discussed to monitor BP at home at least 2 hours after medications and sitting for 5-10 minutes.  Also discussed to log BP trends before and after hemodialysis. GDMT limited due to his ESRD and past medication intolerances-see allergy list. Care and ED precautions discussed.  PAD, Hyperlipidemia LDL 55 in October 2024.  Has been evaluated in the past for critical limb ischemia with tissue loss of left lower extremity.  Has been followed by [facility].  Underwent angioplasty along anterior tibial of left lower extremity along with stent of left SFA. See most recent vascular studies noted above.  Continue current medication regimen.  Continue to follow-up with [facility].   COPD  No concerning issues related to COPD exacerbation at this time.  No medication changes at this time.  Continue follow-up with PCP.  ESRD Continue hemodialysis treatments.  Discussed/recommended logging BP trends before and after dialysis.  He and his wife verbalized understanding.  Morbid obesity Weight loss via diet and exercise encouraged. Discussed the impact being overweight would have on cardiovascular risk.    Dispo: Follow-up with me/APP in 2-3 months or sooner if any changes.  Signed, Lasalle Pointer, NP

## 2024-04-04 NOTE — Patient Instructions (Addendum)
Medication Instructions:  Your physician recommends that you continue on your current medications as directed. Please refer to the Current Medication list given to you today.  Labwork: None  Testing/Procedures: None  Follow-Up: Your physician recommends that you schedule a follow-up appointment in: 2-3 Months   Any Other Special Instructions Will Be Listed Below (If Applicable).  If you need a refill on your cardiac medications before your next appointment, please call your pharmacy.

## 2024-04-18 ENCOUNTER — Ambulatory Visit: Payer: Self-pay | Admitting: Nurse Practitioner

## 2024-05-19 NOTE — Progress Notes (Signed)
 [facility] - Clinic Note  05/25/2024   CHIEF COMPLAINT Patient presents for Retina Follow Up  HISTORY OF PRESENT ILLNESS: Kelly Key is a 53 y.o. male who presents to the clinic today for:  HPI     Retina Follow Up   Patient presents with  Diabetic Retinopathy.  In both eyes.  This started 4 months ago.  I, the attending physician,  performed the HPI with the patient and updated documentation appropriately.        Comments   Patient here for 4 months retina follow up for NPDR OU. Patient states vision doing good. No eye pain.       Last edited by Valdemar Rogue, MD on 05/29/2024 10:07 PM.     Pt states vision doing good.   Referring physician: Luke Agent, MD [address] [place],  [place] [address]  HISTORICAL INFORMATION:  Selected notes from the MEDICAL RECORD NUMBER Referred by Dr. Heron [facility]) for diabetic retinopathy LEE:  Ocular Hx- PMH-   CURRENT MEDICATIONS: No current outpatient medications on file. (Ophthalmic Drugs)   No current facility-administered medications for this visit. (Ophthalmic Drugs)   Current Outpatient Medications (Other)  Medication Sig   acetaminophen  (TYLENOL ) 500 MG tablet Take 1,000 mg by mouth every 6 (six) hours as needed for moderate pain or headache.   albuterol  (VENTOLIN  HFA) 108 (90 Base) MCG/ACT inhaler Inhale 2 puffs into the lungs every 4 (four) hours as needed for wheezing or shortness of breath.   allopurinol  (ZYLOPRIM ) 100 MG tablet Take 2 tablets (200 mg total) by mouth daily.   AMBULATORY NON FORMULARY MEDICATION Inject 0.2 ml by intracavernosal route as directed.  Medication Name: TriMix PGE 30 mcg Pap   30 mg Phent 1 mg   amLODipine  (NORVASC ) 10 MG tablet Take 1 tablet (10 mg total) by mouth daily.   aspirin  EC 81 MG tablet Take 1 tablet (81 mg total) by mouth daily.   atorvastatin  (LIPITOR ) 80 MG tablet Take 1 tablet (80 mg total) by mouth daily.   carvedilol   (COREG ) 25 MG tablet Take 1 tablet (25 mg total) by mouth 2 (two) times daily with a meal.   cloNIDine  (CATAPRES ) 0.3 MG tablet Take 1 tablet (0.3 mg total) by mouth 3 (three) times daily.   clopidogrel  (PLAVIX ) 75 MG tablet Take 75 mg by mouth daily.   esomeprazole (NEXIUM) 20 MG capsule Take 20 mg by mouth daily.   ezetimibe  (ZETIA ) 10 MG tablet Take 10 mg by mouth every evening.   fluticasone  (FLONASE ) 50 MCG/ACT nasal spray Place 1 spray into both nostrils daily as needed for allergies or rhinitis.   furosemide  (LASIX ) 40 MG tablet Please take 2.5 tablets (100 Mg daily) on Dialysis days, and 2 tablets Twice daily on Non Dialysis days (80 Mg Twice Daily)   gabapentin  (NEURONTIN ) 100 MG capsule Take 1 capsule (100 mg total) by mouth 3 (three) times daily.   glucose blood (ACCU-CHEK AVIVA PLUS) test strip Use as instructed TO CHECK BLOOD GLUCOSE THREE TIMES DAILY   HUMALOG KWIKPEN 100 UNIT/ML KwikPen Inject 8-30 Units into the skin 3 (three) times daily. Sliding scale 30 units max TID   insulin  detemir (LEVEMIR  FLEXTOUCH) 100 UNIT/ML FlexPen Inject 50 Units into the skin at bedtime.   Insulin  Pen Needle (B-D ULTRAFINE III SHORT PEN) 31G X 8 MM MISC 1 each by Does not apply route as directed.   isosorbide  mononitrate (IMDUR ) 30 MG 24  hr tablet Take 0.5 tablets (15 mg total) by mouth daily with supper.   linaclotide  (LINZESS ) 145 MCG CAPS capsule Take 145 mcg by mouth daily before breakfast.   sevelamer  carbonate (RENVELA ) 800 MG tablet Take 3 tablets (2,400 mg total) by mouth 3 (three) times daily with meals. (Patient taking differently: Take 2,400 mg by mouth 3 (three) times daily with meals. And once daily with snacks)   VELPHORO  500 MG chewable tablet Chew 500 mg by mouth 3 (three) times daily.   ferrous sulfate  325 (65 FE) MG tablet Take 1 tablet (325 mg total) by mouth 2 (two) times daily with a meal.   Ipratropium-Albuterol  (COMBIVENT ) 20-100 MCG/ACT AERS respimat Inhale 1 puff into the lungs  every 6 (six) hours as needed for wheezing or shortness of breath.   Current Facility-Administered Medications (Other)  Medication Route   0.9 %  sodium chloride  infusion Intravenous   REVIEW OF SYSTEMS: ROS   Positive for: Gastrointestinal, Endocrine, Eyes, Respiratory Negative for: Constitutional, Neurological, Skin, Genitourinary, Musculoskeletal, HENT, Cardiovascular, Psychiatric, Allergic/Imm, Heme/Lymph Last edited by Orval Asberry RAMAN, COA on 05/25/2024  9:30 AM.     ALLERGIES Allergies  Allergen Reactions   Ozempic  (0.25 Or 0.5 Mg-Dose) [Semaglutide (0.25 Or 0.5mg -Dos)] Shortness Of Breath   Hydralazine  Anxiety    Hallucinations    Lisinopril Swelling   PAST MEDICAL HISTORY Past Medical History:  Diagnosis Date   Acute diastolic CHF (congestive heart failure) (HCC) 06/11/2012   EF 50-55% [facility])   Chronic kidney disease    COPD (chronic obstructive pulmonary disease) (HCC)    Diabetes mellitus    A1c 11.5 (06/11/2012).   Dyspnea    Gout    Hepatic steatosis 06/11/2012   Elevated LFTs   Hyperlipemia    Malignant hypertension    Microcytic anemia 06/12/2012   Obesity    Past Surgical History:  Procedure Laterality Date   ABDOMINAL AORTOGRAM W/LOWER EXTREMITY N/A 12/01/2022   Procedure: ABDOMINAL AORTOGRAM W/LOWER EXTREMITY;  Surgeon: Gretta Lonni PARAS, MD;  Location: [facility] INVASIVE CV LAB;  Service: Cardiovascular;  Laterality: N/A;   AMPUTATION Left 12/03/2022   Procedure: LEFT SECOND TOE AMPUTATION;  Surgeon: Harden Jerona GAILS, MD;  Location: [facility] OR;  Service: Orthopedics;  Laterality: Left;   AV FISTULA PLACEMENT Left 11/11/2019   Procedure: ARTERIOVENOUS (AV) FISTULA CREATION LEFT BRACHIOCEPHALIC ARM;  Surgeon: Serene Gaile ORN, MD;  Location: [facility] OR;  Service: Vascular;  Laterality: Left;   COLONOSCOPY WITH PROPOFOL  N/A 09/02/2021   Procedure: COLONOSCOPY WITH PROPOFOL ;  Surgeon: Therisa Bi, MD;  Location: [facility] ENDOSCOPY;  Service: Gastroenterology;  Laterality:  N/A;   IR FLUORO GUIDE CV LINE RIGHT  07/08/2019   IR REMOVAL TUN CV CATH W/O FL  03/23/2020   IR US  GUIDE VASC ACCESS RIGHT  07/08/2019   LEFT HEART CATH AND CORONARY ANGIOGRAPHY N/A 06/12/2021   Procedure: LEFT HEART CATH AND CORONARY ANGIOGRAPHY;  Surgeon: Claudene Victory ORN, MD;  Location: [facility]VAS[facility] CV [facility];  Service: Cardiovascular;  Laterality: N/A;   NO PAST SURGERIES     PERIPHERAL VASCULAR BALLOON ANGIOPLASTY Left 12/01/2022   Procedure: PERIPHERAL VASCULAR BALLOON ANGIOPLASTY;  Surgeon: Gretta Lonni PARAS, MD;  Location: [facility] INVASIVE CV LAB;  Service: Cardiovascular;  Laterality: Left;  Anterior tibial   PERIPHERAL VASCULAR INTERVENTION Left 12/01/2022   Procedure: PERIPHERAL VASCULAR INTERVENTION;  Surgeon: Gretta Lonni PARAS, MD;  Location: [facility] INVASIVE CV LAB;  Service: Cardiovascular;  Laterality: Left;  sfa   VASCULAR SURGERY     FAMILY  HISTORY Family History  Problem Relation Age of Onset   Gout Mother    Asthma Mother    Diabetes Father    Heart failure Father    Diabetes Sister    Hypertension Brother    Pancreatic cancer Brother    Diabetes Sister    SOCIAL HISTORY Social History   Tobacco Use   Smoking status: Never    Passive exposure: Past   Smokeless tobacco: Never  Vaping Use   Vaping status: Never Used  Substance Use Topics   Alcohol use: Not Currently    Comment: haven't drank in over 5 years    Drug use: No       OPHTHALMIC EXAM:  Base Eye Exam     Visual Acuity (Snellen - Linear)       Right Left   Dist cc 20/30 -2 20/20   Dist ph cc 20/25 -2     Correction: Glasses         Tonometry (Tonopen, 9:28 AM)       Right Left   Pressure 18 15         Pupils       Dark Light Shape React APD   Right 2 1 Round Brisk None   Left 2 1 Round Brisk None         Visual Fields (Counting fingers)       Left Right    Full Full         Extraocular Movement       Right Left    Full, Ortho Full, Ortho         Neuro/Psych      Oriented x3: Yes   Mood/Affect: Normal         Dilation     Both eyes: 1.0% Mydriacyl, 2.5% Phenylephrine  @ 9:28 AM           Slit Lamp and Fundus Exam     External Exam       Right Left   External Normal Normal         Slit Lamp Exam       Right Left   Lids/Lashes Dermatochalasis - upper lid, mild MGD Dermatochalasis - upper lid   Conjunctiva/Sclera Mild Melanosis Mild Melanosis   Cornea Arcus arcus, tear film debris   Anterior Chamber Deep and clear Deep and clear   Iris No NVI, Round and dilated No NVI, Round and dilated   Lens 2+ Nuclear sclerosis, 2-3+ Cortical cataract 2+ Nuclear sclerosis, 2-3+ Cortical cataract   Anterior Vitreous Vitreous syneresis Vitreous syneresis         Fundus Exam       Right Left   Disc Pink and sharp, mild PPA Pink and sharp, mild PPA   C/D Ratio 0.6 0.5   Macula Flat, good foveal reflex, central cystic changes -- stably improved, scattered MA/DBH greatest inferotemporal macula Flat, Blunted foveal reflex, central cystic changes -- improved, scattered MA/DBH, good focal laser changes, scattered cystic changes non-centrally   Vessels attenuated, Tortuous, A/V crossing changes attenuated, Tortuous   Periphery Attached, scattered MA/DBH Attached, scattered MA/DBH--greatest posteriorly           Refraction     Wearing Rx       Sphere Cylinder Axis   Right -0.25 +1.50 020   Left [place] +1.25 154           IMAGING AND PROCEDURES  Imaging and Procedures for 05/25/2024  OCT, Retina - OU - Both  Eyes       Right Eye Quality was good. Central Foveal Thickness: 266. Progression has worsened. Findings include normal foveal contour, no SRF, intraretinal hyper-reflective material, intraretinal fluid, vitreomacular adhesion (Stable improvement in central IRF and SRF, scattered punctate IRHM; mild interval increase in cystic changes temporal macula).   Left Eye Quality was good. Central Foveal Thickness: 276. Progression has  been stable. Findings include normal foveal contour, no SRF, intraretinal hyper-reflective material, intraretinal fluid, vitreomacular adhesion (Mild interval increase in IRF/cystic changes IT macula--stably improved centrally).   Notes *Images captured and stored on drive  Diagnosis / Impression:  OD: Stable improvement in central IRF and SRF, scattered punctate IRHM; mild interval increase in cystic changes temporal macula OS: Mild interval increase in IRF/cystic changes IT macula--stably improved centrally  Clinical management:  See below  Abbreviations: NFP - Normal foveal profile. CME - cystoid macular edema. PED - pigment epithelial detachment. IRF - intraretinal fluid. SRF - subretinal fluid. EZ - ellipsoid zone. ERM - epiretinal membrane. ORA - outer retinal atrophy. ORT - outer retinal tubulation. SRHM - subretinal hyper-reflective material. IRHM - intraretinal hyper-reflective material           ASSESSMENT/PLAN:   ICD-10-CM   1. Severe nonproliferative diabetic retinopathy of both eyes with macular edema associated with type 2 diabetes mellitus (HCC)  E11.3413 OCT, Retina - OU - Both Eyes    2. Encounter for long-term (current) use of insulin  (HCC)  Z79.4     3. Essential hypertension  I10     4. Hypertensive retinopathy of both eyes  H35.033     5. Cortical age-related cataract of both eyes  H25.013     6. Anxiety  F41.9      1,2. Severe Non-proliferative diabetic retinopathy, both eyes - last A1c 9.5 (11.26.24); 7.4 (12.23.23) - s/p IVA OD #1 (08.28.24), #2 (09.25.24), #3 (10.23.24) - s/p focal laser OS (01.08.25) - FA (08.28.24) OD: Mild patches of capillary non perfusion peripherally, Late leaking MA, hyperfluorescence of disc, focal perifoveal hyperfluorescence leakage, no NV, OS: Mild patches of capillary non perfusion peripherally, Late leaking MA, no NV - BCVA OD 20/25--stable, OS 20/20 - stable - OCT shows OD:  Stable improvement in central IRF and SRF,  scattered punctate IRHM; mild interval increase in cystic changes temporal macula; OS: mild interval increase in IRF/cystic changes IT macula--stably improved - exam shows scattered MAs OS -- improved post focal laser - recommend holding IVA OD but will f/u in 3 mos for recheck - no tx OS - f/u in 3 mos -- DFE/OCT, possible injection  3,4. Hypertensive retinopathy OU - discussed importance of tight BP control - monitor   5.  Mixed Cataract OU - The symptoms of cataract, surgical options, and treatments and risks were discussed with patient. - discussed diagnosis and progression - monitor   6. Anxiety  - affecting ophthalmic examinations and treatments - may benefit from mild anxiolytic prior to treatment visits (intravitreal injections and lasers)  - recommend discussing with primary care provider  Ophthalmic Meds Ordered this visit:  No orders of the defined types were placed in this encounter.    Return in about 3 months (around 08/25/2024) for DFE, OCT, Possible Injxn.  There are no Patient Instructions on file for this visit.  Explained the diagnoses, plan, and follow up with the patient and they expressed understanding.  Patient expressed understanding of the importance of proper follow up care.   This document serves as a record of services  personally performed by Redell JUDITHANN Hans, MD, PhD. It was created on their behalf by Almetta Pesa, an ophthalmic technician. The creation of this record is the provider's dictation and/or activities during the visit.    Electronically signed by: Almetta Pesa, OA,  05/29/24  10:08 PM   Redell JUDITHANN Hans, M.D., Ph.D. Diseases & Surgery of the Retina and Vitreous [facility] 05/25/2024  I have reviewed the above documentation for accuracy and completeness, and I agree with the above. Redell JUDITHANN Hans, M.D., Ph.D. 05/29/24 10:12 PM   Abbreviations: M myopia (nearsighted); A astigmatism; H hyperopia  (farsighted); P presbyopia; Mrx spectacle prescription;  CTL contact lenses; OD right eye; OS left eye; OU both eyes  XT exotropia; ET esotropia; PEK punctate epithelial keratitis; PEE punctate epithelial erosions; DES dry eye syndrome; MGD meibomian gland dysfunction; ATs artificial tears; PFAT's preservative free artificial tears; NSC nuclear sclerotic cataract; PSC posterior subcapsular cataract; ERM epi-retinal membrane; PVD posterior vitreous detachment; RD retinal detachment; DM diabetes mellitus; DR diabetic retinopathy; NPDR non-proliferative diabetic retinopathy; PDR proliferative diabetic retinopathy; CSME clinically significant macular edema; DME diabetic macular edema; dbh dot blot hemorrhages; CWS cotton wool spot; POAG primary open angle glaucoma; C/D cup-to-disc ratio; HVF humphrey visual field; GVF goldmann visual field; OCT optical coherence tomography; IOP intraocular pressure; BRVO Branch retinal vein occlusion; CRVO central retinal vein occlusion; CRAO central retinal artery occlusion; BRAO branch retinal artery occlusion; RT retinal tear; SB scleral buckle; PPV pars plana vitrectomy; VH Vitreous hemorrhage; PRP panretinal laser photocoagulation; IVK intravitreal kenalog; VMT vitreomacular traction; MH Macular hole;  NVD neovascularization of the disc; NVE neovascularization elsewhere; AREDS age related eye disease study; ARMD age related macular degeneration; POAG primary open angle glaucoma; EBMD epithelial/anterior basement membrane dystrophy; ACIOL anterior chamber intraocular lens; IOL intraocular lens; PCIOL posterior chamber intraocular lens; Phaco/IOL phacoemulsification with intraocular lens placement; PRK photorefractive keratectomy; LASIK laser assisted in situ keratomileusis; HTN hypertension; DM diabetes mellitus; COPD chronic obstructive pulmonary disease

## 2024-05-25 ENCOUNTER — Encounter (INDEPENDENT_AMBULATORY_CARE_PROVIDER_SITE_OTHER): Payer: Self-pay | Admitting: Ophthalmology

## 2024-05-25 ENCOUNTER — Ambulatory Visit (INDEPENDENT_AMBULATORY_CARE_PROVIDER_SITE_OTHER): Admitting: Ophthalmology

## 2024-05-25 DIAGNOSIS — E113413 Type 2 diabetes mellitus with severe nonproliferative diabetic retinopathy with macular edema, bilateral: Secondary | ICD-10-CM

## 2024-05-25 DIAGNOSIS — H25013 Cortical age-related cataract, bilateral: Secondary | ICD-10-CM

## 2024-05-25 DIAGNOSIS — Z794 Long term (current) use of insulin: Secondary | ICD-10-CM

## 2024-05-25 DIAGNOSIS — I10 Essential (primary) hypertension: Secondary | ICD-10-CM | POA: Diagnosis not present

## 2024-05-25 DIAGNOSIS — H35033 Hypertensive retinopathy, bilateral: Secondary | ICD-10-CM | POA: Diagnosis not present

## 2024-05-25 DIAGNOSIS — F419 Anxiety disorder, unspecified: Secondary | ICD-10-CM

## 2024-05-29 ENCOUNTER — Encounter (INDEPENDENT_AMBULATORY_CARE_PROVIDER_SITE_OTHER): Payer: Self-pay | Admitting: Ophthalmology

## 2024-06-13 ENCOUNTER — Other Ambulatory Visit: Payer: Self-pay | Admitting: Nurse Practitioner

## 2024-06-13 MED ORDER — ATORVASTATIN CALCIUM 80 MG PO TABS: 80.0000 mg | ORAL_TABLET | Freq: Every day | ORAL | 2 refills | Status: AC

## 2024-06-20 ENCOUNTER — Encounter: Payer: Self-pay | Admitting: Nurse Practitioner

## 2024-06-20 ENCOUNTER — Ambulatory Visit: Attending: Nurse Practitioner | Admitting: Nurse Practitioner

## 2024-06-20 VITALS — BP 136/82 | HR 68 | Ht 69.0 in | Wt 341.8 lb

## 2024-06-20 DIAGNOSIS — I739 Peripheral vascular disease, unspecified: Secondary | ICD-10-CM

## 2024-06-20 DIAGNOSIS — I5032 Chronic diastolic (congestive) heart failure: Secondary | ICD-10-CM | POA: Diagnosis not present

## 2024-06-20 DIAGNOSIS — I10 Essential (primary) hypertension: Secondary | ICD-10-CM

## 2024-06-20 DIAGNOSIS — E785 Hyperlipidemia, unspecified: Secondary | ICD-10-CM

## 2024-06-20 DIAGNOSIS — J449 Chronic obstructive pulmonary disease, unspecified: Secondary | ICD-10-CM

## 2024-06-20 DIAGNOSIS — N186 End stage renal disease: Secondary | ICD-10-CM

## 2024-06-20 NOTE — Progress Notes (Unsigned)
 Cardiology Office Note:  .   Date: 06/20/2024 ID:  Kelly Key, DOB 09-03-1971, MRN [id number] PCP: Luke Agent, MD (Inactive)   [facility] Providers Cardiologist:  Alvan Carrier, MD    History of Present Illness: .   Kelly Key is a 53 y.o. male with a PMH of HFpEF, hypertension, type 2 diabetes, chronic IDA, ESRD on hemodialysis (Tuesday, Thursday, Saturday schedule), HLD, COPD, and morbid obesity, who presents today for scheduled follow-up.  Previous cardiovascular history includes NSTEMI in 2022. Cardiac catheterization revealed 60% proximal to mid RCA, eccentric 40% PDA distal stenosis.  Ostial LAD 40% stenosis.  Minimal disease in distal left main with widely patent circumflex.  Medical management recommended.  He has not been seen by outpatient cardiology since 2022.  Past history of left SFA stenting to heal left second toe amputation.  Followed by [facility].  Within the past year he has had several ED visits and hospital admissions.  Seen at [facility] ED on September 24, 2023 after a fall.  Said he fell out of bed and hit his head, denied LOC.  He was advised to go to the ED to have a CT of his head performed.  Imaging was negative for anything acute.  Most recently he has was hospitalized in December 2024 for due to acute hypoxic respiratory failure that was likely secondary to combined effect from acute COPD exacerbation as well as acute on chronic HFpEF decompensation.  11/27/2023 - Today he presents for follow-up with his wife.  He tells me he has had issues with fluid buildup recently.  Says he gets short of breath when this occurs.  Does admit to low BPs happening at times with low SBP reported to be in 60s, says he has to be careful with how he positions his feet.  I do not have BP log to evaluate his BP trends recently. Denies any chest pain, palpitations, syncope, presyncope, dizziness, orthopnea, PND, acute bleeding, or claudication.  01/01/2024 -presents for  follow-up today with his wife.  Presents his home BP log that reveals labile BPs.  Majority of readings are not at goal, SBP averaging 140s to 160s.  Does state that he takes his fluid medication after dialysis as sometimes his BP drops after hemodialysis treatments.  No episodes of hypotension noted on BP log. Goes to HD on Tuesdays, Thursdays, and Saturdays.  Weight log is also presented today and overall reveals stable weights.  Per our records, his weight is only down around 1 pound from last office visit. Denies any chest pain, worsening shortness of breath, palpitations, syncope, presyncope, dizziness, orthopnea, PND, acute bleeding, or claudication.  02/29/2024 - Presents today for follow-up with wife. Remains compliant with medications. Admits to labile weights. Brings in log from home that shows overall stable weights since last office visit, however, weight is almost up 10 lbs since last office visit. Pt is fatigued during interview today and attributes this to his insulin . BP is overall improved since last office visit, does admit to BP dropping during at times, but rare per his report when this happens, denies any recent issues with his BP per his report. Denies any chest pain, shortness of breath, palpitations, syncope, presyncope, dizziness, orthopnea, PND, significant weight changes, acute bleeding, or claudication.  04/04/2024 - Here for follow-up. Doing well and weight is down from last office visit. Does have some labile BP readings. Compliant with his medication regimen. Denies any chest pain, shortness of breath, palpitations, syncope, presyncope, dizziness, orthopnea,  PND, swelling or significant weight changes, acute bleeding, or claudication. Took AM medications about 30 minutes ago.   06/20/2024 -here for follow-up and doing well.  Weights are stable and shows me his blood pressure log with labile blood pressures, lower readings noted post dialysis.  Per review of his report from home, I do  not see any hypotensive episodes. Denies any chest pain, shortness of breath, palpitations, syncope, presyncope, dizziness, orthopnea, PND, swelling or significant weight changes, acute bleeding, or claudication.  ROS: Negative.  See HPI.  Studies Reviewed: SABRA    EKG: EKG is not ordered today.   ABI's 02/24/2024:  Summary:  Right: Resting right ankle-brachial index is within normal range. The  right toe-brachial index is abnormal.   Left: Resting left ankle-brachial index is within normal range. The left  toe-brachial index is abnormal.    *See table(s) above for measurements and observations.  See LE Arterial duplex report.  Lower extremity arterial duplex left 02/24/2024:  Summary:  Left: Mild progression is noted compared to previous study.   Atherosclerosis throughout.  Patent SFA stent with mildly elevated velocities consistent with 1-49%  stenosis at pre stent segment.  Mid ATA occlusion, s/p angioplasty.     See table(s) above for measurements and observations.  See ABI report.   Suggest Peripheral Vascular Consult.  Echo 12/2022:  1. Left ventricular ejection fraction, by estimation, is 60 to 65%. The  left ventricle has normal function. The left ventricle has no regional  wall motion abnormalities. There is moderate left ventricular hypertrophy.  Left ventricular diastolic  parameters are consistent with Grade II diastolic dysfunction  (pseudonormalization).   2. Right ventricular systolic function is normal. The right ventricular  size is normal. There is normal pulmonary artery systolic pressure. The  estimated right ventricular systolic pressure is 27.8 mmHg.   3. Left atrial size was severely dilated.   4. The mitral valve is abnormal. Trivial mitral valve regurgitation. No  evidence of mitral stenosis. Moderate to severe mitral annular  calcification.   5. The aortic valve is tricuspid. There is mild calcification of the  aortic valve. Aortic valve  regurgitation is not visualized. Mild aortic  valve stenosis. Aortic valve mean gradient measures 12.0 mmHg. Aortic  valve Vmax measures 2.34 m/s.   6. Aortic dilatation noted. There is moderate dilatation of the aortic  root, measuring 42 mm. There is moderate dilatation of the ascending  aorta, measuring 44 mm.   7. The inferior vena cava is dilated in size with <50% respiratory  variability, suggesting right atrial pressure of 15 mmHg.   Comparison(s): No significant change from prior study.  LHC 05/2021:    60% proximal to mid RCA, eccentric 40% PDA distal stenosis.   Less than 25% distal left main.   Ostial LAD with eccentric 40% stenosis.   Widely patent circumflex   Upper normal LV end-diastolic pressure.  Contrast ventriculography was not performed.   Aggressive risk factor modification including tight blood pressure control, LDL less than 70 and preferably less than 55, weight loss, evaluation for sleep apnea, and diabetes control. High risk for future cardiac events. Does not have critical coronary disease and could be a candidate for kidney transplantation with risk factor modification going forward.   Physical Exam:   VS:  BP 136/82   Pulse 68   Ht 5' 9 (1.753 m)   Wt (!) 341 lb 12.8 oz (155 kg)   SpO2 98%   BMI 50.48 kg/m  Wt Readings from Last 3 Encounters:  06/20/24 (!) 341 lb 12.8 oz (155 kg)  04/04/24 (!) 342 lb (155.1 kg)  02/29/24 (!) 347 lb 12.8 oz (157.8 kg)    GEN: Morbidly obese, 53 year old male in no acute distress NECK: No JVD; No carotid bruits CARDIAC: S1/S2, RRR, no murmurs, rubs, gallops RESPIRATORY:  Clear to auscultation without rales, wheezing or rhonchi  ABDOMEN: Soft, non-tender, non-distended EXTREMITIES:  No significant edema to BLE; No deformity   ASSESSMENT AND PLAN: .    Chronic diastolic CHF Stage C, NYHA class II-III symptoms. EF 60-65% 12/2022. Weight is stable. Most recent BNP normal. Could not take Torsemide  d/t insurance.  Continue current medication regimen. Low sodium diet, fluid restriction <2L, and daily weights encouraged. Educated to contact our office for weight gain of 2 lbs overnight or 5 lbs in one week.  GDMT is limited due to hx of ESRD. Care and ED precautions discussed.  Hypertension Blood pressure stable today, BP readings overall labile. Lower SBP readings after dialysis, no hypotensive episodes noted. More recent readings on log well controlled overall. No medication changes at this time. Discussed to monitor BP at home at least 2 hours after medications and sitting for 5-10 minutes.  Also discussed to log BP trends before and after hemodialysis. GDMT limited due to his ESRD and past medication intolerances-see allergy list. Care and ED precautions discussed.  PAD, Hyperlipidemia LDL 55 in October 2024.  Has been followed by [facility].  Underwent angioplasty along anterior tibial of left lower extremity along with stent of left SFA. See most recent vascular studies noted above.  Continue current medication regimen.  Continue to follow-up with [facility].   COPD  No concerning issues related to COPD exacerbation at this time.  No medication changes at this time.  Continue follow-up with PCP.  ESRD Continue hemodialysis treatments.  Discussed/recommended logging BP trends before and after dialysis.  He and his wife verbalized understanding.  Morbid obesity Weight loss via diet and exercise encouraged. Discussed the impact being overweight would have on cardiovascular risk.    Dispo: Follow-up with me/APP in 3 months or sooner if any changes.  Signed, Almarie Crate, NP

## 2024-06-20 NOTE — Patient Instructions (Addendum)

## 2024-07-06 ENCOUNTER — Emergency Department (HOSPITAL_COMMUNITY)
Admission: EM | Admit: 2024-07-06 | Discharge: 2024-07-06 | Disposition: A | Discharge status: Home / Self Care | Attending: Emergency Medicine | Admitting: Emergency Medicine

## 2024-07-06 ENCOUNTER — Emergency Department (HOSPITAL_COMMUNITY)

## 2024-07-06 ENCOUNTER — Encounter (HOSPITAL_COMMUNITY): Payer: Self-pay

## 2024-07-06 ENCOUNTER — Other Ambulatory Visit: Payer: Self-pay

## 2024-07-06 DIAGNOSIS — Z7982 Long term (current) use of aspirin: Secondary | ICD-10-CM | POA: Diagnosis not present

## 2024-07-06 DIAGNOSIS — N186 End stage renal disease: Secondary | ICD-10-CM | POA: Insufficient documentation

## 2024-07-06 DIAGNOSIS — Z7902 Long term (current) use of antithrombotics/antiplatelets: Secondary | ICD-10-CM | POA: Insufficient documentation

## 2024-07-06 DIAGNOSIS — Z992 Dependence on renal dialysis: Secondary | ICD-10-CM | POA: Insufficient documentation

## 2024-07-06 DIAGNOSIS — I509 Heart failure, unspecified: Secondary | ICD-10-CM | POA: Diagnosis not present

## 2024-07-06 DIAGNOSIS — I132 Hypertensive heart and chronic kidney disease with heart failure and with stage 5 chronic kidney disease, or end stage renal disease: Secondary | ICD-10-CM | POA: Insufficient documentation

## 2024-07-06 DIAGNOSIS — E1122 Type 2 diabetes mellitus with diabetic chronic kidney disease: Secondary | ICD-10-CM | POA: Diagnosis not present

## 2024-07-06 DIAGNOSIS — Z794 Long term (current) use of insulin: Secondary | ICD-10-CM | POA: Insufficient documentation

## 2024-07-06 DIAGNOSIS — Z79899 Other long term (current) drug therapy: Secondary | ICD-10-CM | POA: Diagnosis not present

## 2024-07-06 DIAGNOSIS — W182XXA Fall in (into) shower or empty bathtub, initial encounter: Secondary | ICD-10-CM | POA: Diagnosis not present

## 2024-07-06 DIAGNOSIS — M25562 Pain in left knee: Secondary | ICD-10-CM | POA: Diagnosis present

## 2024-07-06 MED ORDER — LIDOCAINE 5 % EX PTCH
1.0000 | MEDICATED_PATCH | CUTANEOUS | Status: DC
  Administered 2024-07-06: 1 via TRANSDERMAL
  Filled 2024-07-06: qty 1

## 2024-07-06 NOTE — ED Triage Notes (Signed)
 Pt arrived via POV from home c/o left knee pain X 4 days when Pt reports he fell in the shower. Pt reports difficulty with ambulation now. Pts LUE is restricted and reports he is a Tues, Thurs, [id number] Dialysis Pt.

## 2024-07-06 NOTE — Discharge Instructions (Signed)
 Seen in the ER today for knee pain after a fall.  Fortunately your x-rays did not show any broken bones, dislocation or fluid around the joint.  You can use Tylenol  over-the-counter, over-the-counter lidocaine  patches.  You can also talk to your kidney doctor to see if they are okay with you using topical Voltaren  gel.  Follow-up with orthopedics.

## 2024-07-06 NOTE — ED Provider Notes (Signed)
 [place] EMERGENCY DEPARTMENT AT [facility] Provider Note   CSN: [id number] Arrival date & time: 07/06/24  1259     Patient presents with: Knee Pain   Brolin SARTAJ HOSKIN is a 53 y.o. male.  He has history of ESRD, CHF, hypertension, type 2 diabetes.  He presents the ER today complaining of left knee pain.  He states he fell last week in the shower, slipped on soap and fell on his left knee.  He initially had pain that got better but as he has been walking more over the past couple days he is having more pain with walking.  Denies fevers or chills , redness, warmth.     Knee Pain      Prior to Admission medications   Medication Sig Start Date End Date Taking? Authorizing Provider  acetaminophen  (TYLENOL ) 500 MG tablet Take 1,000 mg by mouth every 6 (six) hours as needed for moderate pain or headache.    [provider]  albuterol  (VENTOLIN  HFA) 108 (90 Base) MCG/ACT inhaler Inhale 2 puffs into the lungs every 4 (four) hours as needed for wheezing or shortness of breath. 05/06/23   Pearlean Manus, MD  allopurinol  (ZYLOPRIM ) 100 MG tablet Take 2 tablets (200 mg total) by mouth daily. 08/04/19   Love, Sharlet RAMAN, PA-C  AMBULATORY NON FORMULARY MEDICATION Inject 0.2 ml by intracavernosal route as directed.  Medication Name: TriMix PGE 30 mcg Pap   30 mg Phent 1 mg 07/01/23   Sherrilee Belvie CROME, MD  amLODipine  (NORVASC ) 10 MG tablet Take 1 tablet (10 mg total) by mouth daily. 04/04/24   Miriam Norris, NP  aspirin  EC 81 MG tablet Take 1 tablet (81 mg total) by mouth daily. 05/06/23   Pearlean Manus, MD  atorvastatin  (LIPITOR ) 80 MG tablet Take 1 tablet (80 mg total) by mouth daily. 06/13/24   Miriam Norris, NP  carvedilol  (COREG ) 25 MG tablet Take 1 tablet (25 mg total) by mouth 2 (two) times daily with a meal. 05/06/23   Emokpae, Courage, MD  cloNIDine  (CATAPRES ) 0.3 MG tablet Take 1 tablet (0.3 mg total) by mouth 3 (three) times daily. 05/06/23   Pearlean Manus, MD   clopidogrel  (PLAVIX ) 75 MG tablet Take 75 mg by mouth daily. 08/26/23   [provider]  esomeprazole (NEXIUM) 20 MG capsule Take 20 mg by mouth daily. 01/23/22   [provider]  ezetimibe  (ZETIA ) 10 MG tablet Take 10 mg by mouth every evening. 01/20/19   [provider]  ferrous sulfate  325 (65 FE) MG tablet Take 1 tablet (325 mg total) by mouth 2 (two) times daily with a meal. 09/30/23 06/20/24  Von Bellis, MD  fluticasone  (FLONASE ) 50 MCG/ACT nasal spray Place 1 spray into both nostrils daily as needed for allergies or rhinitis.    [provider]  furosemide  (LASIX ) 40 MG tablet Please take 2.5 tablets (100 Mg daily) on Dialysis days, and 2 tablets Twice daily on Non Dialysis days (80 Mg Twice Daily) 04/04/24   Miriam Norris, NP  gabapentin  (NEURONTIN ) 100 MG capsule Take 1 capsule (100 mg total) by mouth 3 (three) times daily. 08/03/19   Love, Sharlet RAMAN, PA-C  glucose blood (ACCU-CHEK AVIVA PLUS) test strip Use as instructed TO CHECK BLOOD GLUCOSE THREE TIMES DAILY 08/15/20   Nida, Gebreselassie W, MD  HUMALOG KWIKPEN 100 UNIT/ML KwikPen Inject 8-30 Units into the skin 3 (three) times daily. Sliding scale 30 units max TID 08/19/22   [provider]  insulin   detemir (LEVEMIR  FLEXTOUCH) 100 UNIT/ML FlexPen Inject 50 Units into the skin at bedtime. 05/06/23   Pearlean Manus, MD  Insulin  Pen Needle (B-D ULTRAFINE III SHORT PEN) 31G X 8 MM MISC 1 each by Does not apply route as directed. 08/13/20   Nida, Gebreselassie W, MD  Ipratropium-Albuterol  (COMBIVENT ) 20-100 MCG/ACT AERS respimat Inhale 1 puff into the lungs every 6 (six) hours as needed for wheezing or shortness of breath. 09/30/23 06/20/24  Von Bellis, MD  isosorbide  mononitrate (IMDUR ) 30 MG 24 hr tablet Take 0.5 tablets (15 mg total) by mouth daily with supper. 04/04/24 07/03/24  Miriam Norris, NP  linaclotide  (LINZESS ) 145 MCG CAPS capsule Take 145 mcg by mouth daily before breakfast.    [provider]  multivitamin (RENA-VIT) TABS tablet Take 1 tablet by mouth daily. 06/16/24   [provider]  sevelamer  carbonate (RENVELA ) 800 MG tablet Take 3 tablets (2,400 mg total) by mouth 3 (three) times daily with meals. Patient taking differently: Take 2,400 mg by mouth 3 (three) times daily with meals. And once daily with snacks 08/03/19   Love, Pamela S, PA-C  VELPHORO  500 MG chewable tablet Chew 500 mg by mouth 3 (three) times daily. 08/13/23   [provider]    Allergies: Ozempic  (0.25 or 0.5 mg-dose) [semaglutide (0.25 or 0.5mg -dos)], Hydralazine , and Lisinopril    Review of Systems  Updated Vital Signs BP (!) 124/59   Pulse 69   Temp 98.1 F (36.7 C)   Resp 17   Ht 5' 9 (1.753 m)   Wt (!) 151 kg   SpO2 96%   BMI 49.18 kg/m   Physical Exam Vitals and nursing note reviewed.  Constitutional:      General: He is not in acute distress.    Appearance: He is well-developed.  HENT:     Head: Normocephalic and atraumatic.  Eyes:     Conjunctiva/sclera: Conjunctivae normal.  Cardiovascular:     Rate and Rhythm: Normal rate and regular rhythm.     Heart sounds: No murmur heard. Pulmonary:     Effort: Pulmonary effort is normal. No respiratory distress.     Breath sounds: Normal breath sounds.  Abdominal:     Palpations: Abdomen is soft.     Tenderness: There is no abdominal tenderness.  Musculoskeletal:        General: No swelling.     Cervical back: Neck supple.     Comments: Left knee has minimal soft tissue swelling, no erythema or warmth, distal pulses intact in the left foot.  He can bend to about 60 degrees, beyond that is painful.  He can fully extend without difficulty.  There is no crepitus.  Negative anterior and posterior drawer test.  Skin:    General: Skin is warm and dry.     Capillary Refill: Capillary refill takes less than 2 seconds.  Neurological:     General: No focal deficit present.     Mental Status: He is alert and oriented  to person, place, and time.  Psychiatric:        Mood and Affect: Mood normal.     (all labs ordered are listed, but only abnormal results are displayed) Labs Reviewed - No data to display  EKG: None  Radiology: DG Knee Complete 4 Views Left Result Date: 07/06/2024 CLINICAL DATA:  Left knee pain after fall 4 days ago. EXAM: LEFT KNEE - COMPLETE 4+ VIEW COMPARISON:  None Available. FINDINGS: No evidence of fracture, dislocation, or joint effusion.  Minimal osteophyte formation is noted medially. No significant joint space narrowing is noted. Soft tissues are unremarkable. IMPRESSION: Minimal degenerative joint disease is noted medially. No acute abnormality seen. Electronically Signed   By: Lynwood Landy Raddle M.D.   On: 07/06/2024 13:42     Procedures   Medications Ordered in the ED  lidocaine  (LIDODERM ) 5 % 1 patch (1 patch Transdermal Patch Applied 07/06/24 1515)                                    Medical Decision Making Differential diagnose includes but not limited to fracture, sprain, meniscal injury, septic arthritis, osteoarthritis, gout, other  Course: Patient has history of ESRD, diabetes, hypertension.  Presents for left knee pain after mechanical fall in the shower about a week ago.  He is able to bear weight and ambulate but it is painful, he does have a walker at home that he is able to use.  There is no joint effusion, x-ray shows no fracture or dislocation.  Just some mild degenerative changes.  Dislocation he likely has a sprain, we will continue with supportive care he can take Tylenol  over-the-counter, advise he discuss with his kidney doctor if he is able to take Voltaren  gel.  Discussed with him this is generally safe but he was wary so he is going to discuss with his kidney doctor.  Will give him a knee brace and Ortho follow-up.  Patient is able to flex his knee, no systemic symptoms, do not suspect septic arthritis.  Amount and/or Complexity of Data  Reviewed Radiology: ordered.  Risk Prescription drug management.        Final diagnoses:  Acute pain of left knee    ED Discharge Orders     None          Suellen Sherran DELENA DEVONNA 07/06/24 1528    Yolande Lamar BROCKS, MD 07/10/24 [phone]

## 2024-08-01 ENCOUNTER — Other Ambulatory Visit: Payer: Self-pay

## 2024-08-01 ENCOUNTER — Emergency Department (HOSPITAL_COMMUNITY)
Admission: EM | Admit: 2024-08-01 | Discharge: 2024-08-02 | Disposition: A | Discharge status: Home / Self Care | Source: Home / Self Care | Attending: Emergency Medicine | Admitting: Emergency Medicine

## 2024-08-01 ENCOUNTER — Encounter (HOSPITAL_COMMUNITY): Payer: Self-pay

## 2024-08-01 ENCOUNTER — Emergency Department (HOSPITAL_COMMUNITY)

## 2024-08-01 DIAGNOSIS — I132 Hypertensive heart and chronic kidney disease with heart failure and with stage 5 chronic kidney disease, or end stage renal disease: Secondary | ICD-10-CM | POA: Insufficient documentation

## 2024-08-01 DIAGNOSIS — N186 End stage renal disease: Secondary | ICD-10-CM | POA: Insufficient documentation

## 2024-08-01 DIAGNOSIS — Z794 Long term (current) use of insulin: Secondary | ICD-10-CM | POA: Insufficient documentation

## 2024-08-01 DIAGNOSIS — Z79899 Other long term (current) drug therapy: Secondary | ICD-10-CM | POA: Insufficient documentation

## 2024-08-01 DIAGNOSIS — J441 Chronic obstructive pulmonary disease with (acute) exacerbation: Secondary | ICD-10-CM | POA: Insufficient documentation

## 2024-08-01 DIAGNOSIS — Z992 Dependence on renal dialysis: Secondary | ICD-10-CM | POA: Insufficient documentation

## 2024-08-01 DIAGNOSIS — I509 Heart failure, unspecified: Secondary | ICD-10-CM | POA: Insufficient documentation

## 2024-08-01 DIAGNOSIS — Z7982 Long term (current) use of aspirin: Secondary | ICD-10-CM | POA: Insufficient documentation

## 2024-08-01 LAB — BASIC METABOLIC PANEL WITH GFR
Anion gap: 14 (ref 5–15)
BUN: 72 mg/dL — ABNORMAL HIGH (ref 6–20)
CO2: 26 mmol/L (ref 22–32)
Calcium: 9.4 mg/dL (ref 8.9–10.3)
Chloride: 97 mmol/L — ABNORMAL LOW (ref 98–111)
Creatinine, Ser: 12.1 mg/dL — ABNORMAL HIGH (ref 0.61–1.24)
GFR, Estimated: 4 mL/min — ABNORMAL LOW (ref >60)
Glucose, Bld: 121 mg/dL — ABNORMAL HIGH (ref 70–99)
Potassium: 4.4 mmol/L (ref 3.5–5.1)
Sodium: 137 mmol/L (ref 135–145)

## 2024-08-01 LAB — CBC
HCT: 28.1 % — ABNORMAL LOW (ref 39.0–52.0)
Hemoglobin: 9 g/dL — ABNORMAL LOW (ref 13.0–17.0)
MCH: 29.4 pg (ref 26.0–34.0)
MCHC: 32 g/dL (ref 30.0–36.0)
MCV: 91.8 fL (ref 80.0–100.0)
Platelets: 214 K/uL (ref 150–400)
RBC: 3.06 MIL/uL — ABNORMAL LOW (ref 4.22–5.81)
RDW: 15.2 % (ref 11.5–15.5)
WBC: 7.5 K/uL (ref 4.0–10.5)
nRBC: 0 % (ref 0.0–0.2)

## 2024-08-01 MED ORDER — IPRATROPIUM-ALBUTEROL 0.5-2.5 (3) MG/3ML IN SOLN
3.0000 mL | Freq: Once | RESPIRATORY_TRACT | Status: AC
  Administered 2024-08-01: 3 mL via RESPIRATORY_TRACT
  Filled 2024-08-01: qty 3

## 2024-08-01 MED ORDER — FUROSEMIDE 10 MG/ML IJ SOLN
100.0000 mg | INTRAVENOUS | Status: AC
  Administered 2024-08-01: 100 mg via INTRAVENOUS
  Filled 2024-08-01: qty 8

## 2024-08-01 MED ORDER — METHYLPREDNISOLONE SODIUM SUCC 125 MG IJ SOLR
125.0000 mg | Freq: Once | INTRAMUSCULAR | Status: AC
  Administered 2024-08-01: 125 mg via INTRAVENOUS
  Filled 2024-08-01: qty 2

## 2024-08-01 MED ORDER — ALBUTEROL SULFATE HFA 108 (90 BASE) MCG/ACT IN AERS: 2.0000 | INHALATION_SPRAY | RESPIRATORY_TRACT | 3 refills | Status: AC | PRN

## 2024-08-01 MED ORDER — IPRATROPIUM-ALBUTEROL 0.5-2.5 (3) MG/3ML IN SOLN
6.0000 mL | Freq: Once | RESPIRATORY_TRACT | Status: AC
  Administered 2024-08-01: 6 mL via RESPIRATORY_TRACT
  Filled 2024-08-01: qty 6

## 2024-08-01 MED ORDER — PREDNISONE 20 MG PO TABS: 40.0000 mg | ORAL_TABLET | Freq: Every day | ORAL | 0 refills | Status: DC

## 2024-08-01 NOTE — ED Provider Notes (Signed)
 [place] EMERGENCY DEPARTMENT AT [facility] Provider Note   CSN: [id number] Arrival date & time: 08/01/24  2031     Patient presents with: Shortness of Breath   Kelly Key is a 53 y.o. male.  {Add pertinent medical, surgical, social history, OB history to HPI:5831} 53 year old male history of ESRD, CHF, hypertension, and COPD who presents to the emergency department with shortness of breath.  Patient reports that he gets dialysis on Tuesday Thursday Saturday.  Did get it on Saturday.  Says that today he started feeling short of breath and thinks it is because he has been drinking too much water.  Legs have been more swollen than usual.  Does still make urine.  No fevers or chills or runny nose or sore throat.  No diaphoresis or vomiting.  No chest pain.  Tried his inhaler prior to arrival but did not relieve his symptoms       Prior to Admission medications   Medication Sig Start Date End Date Taking? Authorizing Provider  acetaminophen  (TYLENOL ) 500 MG tablet Take 1,000 mg by mouth every 6 (six) hours as needed for moderate pain or headache.    [provider]  albuterol  (VENTOLIN  HFA) 108 (90 Base) MCG/ACT inhaler Inhale 2 puffs into the lungs every 4 (four) hours as needed for wheezing or shortness of breath. 05/06/23   Pearlean Manus, MD  allopurinol  (ZYLOPRIM ) 100 MG tablet Take 2 tablets (200 mg total) by mouth daily. 08/04/19   Love, Sharlet RAMAN, PA-C  AMBULATORY NON FORMULARY MEDICATION Inject 0.2 ml by intracavernosal route as directed.  Medication Name: TriMix PGE 30 mcg Pap   30 mg Phent 1 mg 07/01/23   McKenzie, Belvie CROME, MD  amLODipine  (NORVASC ) 10 MG tablet Take 1 tablet (10 mg total) by mouth daily. 04/04/24   Miriam Norris, NP  aspirin  EC 81 MG tablet Take 1 tablet (81 mg total) by mouth daily. 05/06/23   Pearlean Manus, MD  atorvastatin  (LIPITOR ) 80 MG tablet Take 1 tablet (80 mg total) by mouth daily. 06/13/24   Miriam Norris, NP  carvedilol   (COREG ) 25 MG tablet Take 1 tablet (25 mg total) by mouth 2 (two) times daily with a meal. 05/06/23   Emokpae, Courage, MD  cloNIDine  (CATAPRES ) 0.3 MG tablet Take 1 tablet (0.3 mg total) by mouth 3 (three) times daily. 05/06/23   Pearlean Manus, MD  clopidogrel  (PLAVIX ) 75 MG tablet Take 75 mg by mouth daily. 08/26/23   [provider]  esomeprazole (NEXIUM) 20 MG capsule Take 20 mg by mouth daily. 01/23/22   [provider]  ezetimibe  (ZETIA ) 10 MG tablet Take 10 mg by mouth every evening. 01/20/19   [provider]  ferrous sulfate  325 (65 FE) MG tablet Take 1 tablet (325 mg total) by mouth 2 (two) times daily with a meal. 09/30/23 06/20/24  Von Bellis, MD  fluticasone  (FLONASE ) 50 MCG/ACT nasal spray Place 1 spray into both nostrils daily as needed for allergies or rhinitis.    [provider]  furosemide  (LASIX ) 40 MG tablet Please take 2.5 tablets (100 Mg daily) on Dialysis days, and 2 tablets Twice daily on Non Dialysis days (80 Mg Twice Daily) 04/04/24   Miriam Norris, NP  gabapentin  (NEURONTIN ) 100 MG capsule Take 1 capsule (100 mg total) by mouth 3 (three) times daily. 08/03/19   Love, Sharlet RAMAN, PA-C  glucose blood (ACCU-CHEK AVIVA PLUS) test strip Use as instructed TO CHECK BLOOD GLUCOSE THREE TIMES DAILY 08/15/20  Nida, Gebreselassie W, MD  HUMALOG KWIKPEN 100 UNIT/ML KwikPen Inject 8-30 Units into the skin 3 (three) times daily. Sliding scale 30 units max TID 08/19/22   [provider]  insulin  detemir (LEVEMIR  FLEXTOUCH) 100 UNIT/ML FlexPen Inject 50 Units into the skin at bedtime. 05/06/23   Pearlean Manus, MD  Insulin  Pen Needle (B-D ULTRAFINE III SHORT PEN) 31G X 8 MM MISC 1 each by Does not apply route as directed. 08/13/20   Nida, Gebreselassie W, MD  Ipratropium-Albuterol  (COMBIVENT ) 20-100 MCG/ACT AERS respimat Inhale 1 puff into the lungs every 6 (six) hours as needed for wheezing or shortness of breath. 09/30/23 06/20/24  Von Bellis,  MD  isosorbide  mononitrate (IMDUR ) 30 MG 24 hr tablet Take 0.5 tablets (15 mg total) by mouth daily with supper. 04/04/24 07/03/24  Miriam Norris, NP  linaclotide  (LINZESS ) 145 MCG CAPS capsule Take 145 mcg by mouth daily before breakfast.    [provider]  multivitamin (RENA-VIT) TABS tablet Take 1 tablet by mouth daily. 06/16/24   [provider]  sevelamer  carbonate (RENVELA ) 800 MG tablet Take 3 tablets (2,400 mg total) by mouth 3 (three) times daily with meals. Patient taking differently: Take 2,400 mg by mouth 3 (three) times daily with meals. And once daily with snacks 08/03/19   Love, Pamela S, PA-C  VELPHORO  500 MG chewable tablet Chew 500 mg by mouth 3 (three) times daily. 08/13/23   [provider]    Allergies: Ozempic  (0.25 or 0.5 mg-dose) [semaglutide (0.25 or 0.5mg -dos)], Hydralazine , and Lisinopril    Review of Systems  Updated Vital Signs BP (!) 153/81 (BP Location: Right Arm)   Pulse 70   Temp 98.1 F (36.7 C) (Oral)   Resp 20   Ht 5' 9 (1.753 m)   Wt (!) 155.9 kg   SpO2 100%   BMI 50.76 kg/m   Physical Exam Constitutional:      General: He is not in acute distress.    Appearance: Normal appearance. He is not ill-appearing.  Cardiovascular:     Rate and Rhythm: Normal rate and regular rhythm.     Pulses: Normal pulses.     Heart sounds: Normal heart sounds.     Comments: Facial and left upper extremity with bruit and thrill Pulmonary:     Effort: Pulmonary effort is normal.     Breath sounds: Rales (Bibasilar) present.  Musculoskeletal:     Right lower leg: Edema (1+) present.     Left lower leg: Edema (1+) present.  Neurological:     Mental Status: He is alert.     (all labs ordered are listed, but only abnormal results are displayed) Labs Reviewed  CBC - Abnormal; Notable for the following components:      Result Value   RBC 3.06 (*)    Hemoglobin 9.0 (*)    HCT 28.1 (*)    All other components within normal limits   BASIC METABOLIC PANEL WITH GFR    EKG: EKG Interpretation Date/Time:  Monday August 01 2024 20:44:35 EDT Ventricular Rate:  80 PR Interval:  246 QRS Duration:  100 QT Interval:  408 QTC Calculation: 470 R Axis:   245  Text Interpretation: Sinus rhythm with 1st degree A-V block Right superior axis deviation Septal infarct (cited on or before 29-Sep-2023) Abnormal ECG When compared with ECG of 26-Oct-2023 23:40, Questionable change in QRS axis Confirmed by Yolande Charleston [phone]) on 08/01/2024 9:20:22 PM  Radiology: DG Chest 2 View Result Date: 08/01/2024 CLINICAL DATA:  Shortness of breath for several days, initial encounter EXAM: CHEST - 2 VIEW COMPARISON:  10/27/2023 FINDINGS: Cardiac shadow is enlarged. Mild vascular congestion is noted although improved when compared with the prior exam. No peripheral edema is seen. No focal infiltrate is noted. IMPRESSION: Mild central vascular congestion without edema. Electronically Signed   By: Oneil Devonshire M.D.   On: 08/01/2024 21:05    {Document cardiac monitor, telemetry assessment procedure when appropriate:32947} Procedures   Medications Ordered in the ED  furosemide  (LASIX ) 100 mg in dextrose  5 % 50 mL IVPB (has no administration in time range)  ipratropium-albuterol  (DUONEB) 0.5-2.5 (3) MG/3ML nebulizer solution 6 mL (has no administration in time range)  methylPREDNISolone  sodium succinate (SOLU-MEDROL ) 125 mg/2 mL injection 125 mg (has no administration in time range)      {Click here for ABCD2, HEART and other calculators REFRESH Note before signing:1}                              Medical Decision Making Amount and/or Complexity of Data Reviewed Labs: ordered. Radiology: ordered.  Risk Prescription drug management.   ***  {Document critical care time when appropriate  Document review of labs and clinical decision tools ie CHADS2VASC2, etc  Document your independent review of radiology images and any outside records   Document your discussion with family members, caretakers and with consultants  Document social determinants of health affecting pt's care  Document your decision making why or why not admission, treatments were needed:32947:::1}   Final diagnoses:  None    ED Discharge Orders     None

## 2024-08-01 NOTE — Discharge Instructions (Signed)
 You were seen for shortness of breath in the emergency department.   At home, please take the steroids for the next 4 days and use your inhaler.  Make sure you go to dialysis tomorrow.  Check your MyChart online for the results of any tests that had not resulted by the time you left the emergency department.   Follow-up with your primary doctor in 2-3 days regarding your visit.    Return immediately to the emergency department if you experience any of the following: Difficulty breathing, or any other concerning symptoms.    Thank you for visiting our Emergency Department. It was a pleasure taking care of you today.

## 2024-08-01 NOTE — ED Triage Notes (Signed)
 Pt to ED with c/o sob that has been intermittent since dialysis on Saturday, but started improving, then today came back, pt says he thinks he may have drank to much water today. Pt dialysis days Tue TH, SAT- pt reports compliance with dialysis. Pt with hx of CHF

## 2024-08-04 ENCOUNTER — Other Ambulatory Visit: Payer: Self-pay

## 2024-08-04 ENCOUNTER — Emergency Department (HOSPITAL_COMMUNITY)

## 2024-08-04 ENCOUNTER — Encounter (HOSPITAL_COMMUNITY): Payer: Self-pay

## 2024-08-04 ENCOUNTER — Inpatient Hospital Stay (HOSPITAL_COMMUNITY)
Admission: EM | Admit: 2024-08-04 | Discharge: 2024-08-05 | DRG: 291 | Disposition: A | Discharge status: Home / Self Care | Attending: Internal Medicine | Admitting: Internal Medicine

## 2024-08-04 DIAGNOSIS — I161 Hypertensive emergency: Secondary | ICD-10-CM | POA: Diagnosis present

## 2024-08-04 DIAGNOSIS — J9811 Atelectasis: Secondary | ICD-10-CM | POA: Diagnosis present

## 2024-08-04 DIAGNOSIS — Z992 Dependence on renal dialysis: Secondary | ICD-10-CM | POA: Diagnosis not present

## 2024-08-04 DIAGNOSIS — Z794 Long term (current) use of insulin: Secondary | ICD-10-CM

## 2024-08-04 DIAGNOSIS — E785 Hyperlipidemia, unspecified: Secondary | ICD-10-CM | POA: Diagnosis present

## 2024-08-04 DIAGNOSIS — E1122 Type 2 diabetes mellitus with diabetic chronic kidney disease: Secondary | ICD-10-CM | POA: Diagnosis present

## 2024-08-04 DIAGNOSIS — N186 End stage renal disease: Secondary | ICD-10-CM | POA: Diagnosis present

## 2024-08-04 DIAGNOSIS — Z79899 Other long term (current) drug therapy: Secondary | ICD-10-CM

## 2024-08-04 DIAGNOSIS — J440 Chronic obstructive pulmonary disease with acute lower respiratory infection: Secondary | ICD-10-CM | POA: Diagnosis present

## 2024-08-04 DIAGNOSIS — E1165 Type 2 diabetes mellitus with hyperglycemia: Secondary | ICD-10-CM | POA: Diagnosis present

## 2024-08-04 DIAGNOSIS — Z8249 Family history of ischemic heart disease and other diseases of the circulatory system: Secondary | ICD-10-CM

## 2024-08-04 DIAGNOSIS — E1151 Type 2 diabetes mellitus with diabetic peripheral angiopathy without gangrene: Secondary | ICD-10-CM | POA: Diagnosis present

## 2024-08-04 DIAGNOSIS — D631 Anemia in chronic kidney disease: Secondary | ICD-10-CM | POA: Diagnosis present

## 2024-08-04 DIAGNOSIS — Z7902 Long term (current) use of antithrombotics/antiplatelets: Secondary | ICD-10-CM | POA: Diagnosis not present

## 2024-08-04 DIAGNOSIS — Z7982 Long term (current) use of aspirin: Secondary | ICD-10-CM | POA: Diagnosis not present

## 2024-08-04 DIAGNOSIS — J189 Pneumonia, unspecified organism: Principal | ICD-10-CM | POA: Diagnosis present

## 2024-08-04 DIAGNOSIS — J9601 Acute respiratory failure with hypoxia: Secondary | ICD-10-CM | POA: Diagnosis present

## 2024-08-04 DIAGNOSIS — I2489 Other forms of acute ischemic heart disease: Secondary | ICD-10-CM | POA: Diagnosis present

## 2024-08-04 DIAGNOSIS — D638 Anemia in other chronic diseases classified elsewhere: Secondary | ICD-10-CM

## 2024-08-04 DIAGNOSIS — N289 Disorder of kidney and ureter, unspecified: Secondary | ICD-10-CM

## 2024-08-04 DIAGNOSIS — G4733 Obstructive sleep apnea (adult) (pediatric): Secondary | ICD-10-CM | POA: Diagnosis present

## 2024-08-04 DIAGNOSIS — Z825 Family history of asthma and other chronic lower respiratory diseases: Secondary | ICD-10-CM

## 2024-08-04 DIAGNOSIS — I132 Hypertensive heart and chronic kidney disease with heart failure and with stage 5 chronic kidney disease, or end stage renal disease: Secondary | ICD-10-CM | POA: Diagnosis present

## 2024-08-04 DIAGNOSIS — E8770 Fluid overload, unspecified: Secondary | ICD-10-CM

## 2024-08-04 DIAGNOSIS — Z91119 Patient's noncompliance with dietary regimen due to unspecified reason: Secondary | ICD-10-CM

## 2024-08-04 DIAGNOSIS — Z89422 Acquired absence of other left toe(s): Secondary | ICD-10-CM

## 2024-08-04 DIAGNOSIS — Z6841 Body Mass Index (BMI) 40.0 and over, adult: Secondary | ICD-10-CM | POA: Diagnosis not present

## 2024-08-04 DIAGNOSIS — Z888 Allergy status to other drugs, medicaments and biological substances status: Secondary | ICD-10-CM

## 2024-08-04 DIAGNOSIS — I5033 Acute on chronic diastolic (congestive) heart failure: Secondary | ICD-10-CM | POA: Diagnosis present

## 2024-08-04 DIAGNOSIS — E871 Hypo-osmolality and hyponatremia: Secondary | ICD-10-CM | POA: Diagnosis present

## 2024-08-04 DIAGNOSIS — I440 Atrioventricular block, first degree: Secondary | ICD-10-CM | POA: Diagnosis present

## 2024-08-04 DIAGNOSIS — R7989 Other specified abnormal findings of blood chemistry: Secondary | ICD-10-CM | POA: Diagnosis not present

## 2024-08-04 DIAGNOSIS — J441 Chronic obstructive pulmonary disease with (acute) exacerbation: Secondary | ICD-10-CM | POA: Diagnosis present

## 2024-08-04 DIAGNOSIS — E669 Obesity, unspecified: Secondary | ICD-10-CM | POA: Diagnosis present

## 2024-08-04 DIAGNOSIS — I10 Essential (primary) hypertension: Secondary | ICD-10-CM

## 2024-08-04 DIAGNOSIS — Z800 Family history of malignant neoplasm of digestive organs: Secondary | ICD-10-CM

## 2024-08-04 DIAGNOSIS — Z833 Family history of diabetes mellitus: Secondary | ICD-10-CM

## 2024-08-04 LAB — CBC WITH DIFFERENTIAL/PLATELET
Abs Immature Granulocytes: 0.1 K/uL — ABNORMAL HIGH (ref 0.00–0.07)
Basophils Absolute: 0 K/uL (ref 0.0–0.1)
Basophils Relative: 0 %
Eosinophils Absolute: 0 K/uL (ref 0.0–0.5)
Eosinophils Relative: 0 %
HCT: 29.4 % — ABNORMAL LOW (ref 39.0–52.0)
Hemoglobin: 9.6 g/dL — ABNORMAL LOW (ref 13.0–17.0)
Immature Granulocytes: 1 %
Lymphocytes Relative: 16 %
Lymphs Abs: 2 K/uL (ref 0.7–4.0)
MCH: 29.8 pg (ref 26.0–34.0)
MCHC: 32.7 g/dL (ref 30.0–36.0)
MCV: 91.3 fL (ref 80.0–100.0)
Monocytes Absolute: 0.8 K/uL (ref 0.1–1.0)
Monocytes Relative: 7 %
Neutro Abs: 9.5 K/uL — ABNORMAL HIGH (ref 1.7–7.7)
Neutrophils Relative %: 76 %
Platelets: 228 K/uL (ref 150–400)
RBC: 3.22 MIL/uL — ABNORMAL LOW (ref 4.22–5.81)
RDW: 14.9 % (ref 11.5–15.5)
WBC: 12.4 K/uL — ABNORMAL HIGH (ref 4.0–10.5)
nRBC: 0 % (ref 0.0–0.2)

## 2024-08-04 LAB — BASIC METABOLIC PANEL WITH GFR
Anion gap: 16 — ABNORMAL HIGH (ref 5–15)
BUN: 78 mg/dL — ABNORMAL HIGH (ref 6–20)
CO2: 26 mmol/L (ref 22–32)
Calcium: 9.4 mg/dL (ref 8.9–10.3)
Chloride: 90 mmol/L — ABNORMAL LOW (ref 98–111)
Creatinine, Ser: 10.6 mg/dL — ABNORMAL HIGH (ref 0.61–1.24)
GFR, Estimated: 5 mL/min — ABNORMAL LOW (ref >60)
Glucose, Bld: 466 mg/dL — ABNORMAL HIGH (ref 70–99)
Potassium: 4.9 mmol/L (ref 3.5–5.1)
Sodium: 131 mmol/L — ABNORMAL LOW (ref 135–145)

## 2024-08-04 LAB — GLUCOSE, CAPILLARY
Glucose-Capillary: 356 mg/dL — ABNORMAL HIGH (ref 70–99)
Glucose-Capillary: 529 mg/dL (ref 70–99)

## 2024-08-04 LAB — TROPONIN T, HIGH SENSITIVITY
Troponin T High Sensitivity: 109 ng/L (ref 0–19)
Troponin T High Sensitivity: 107 ng/L (ref 0–19)

## 2024-08-04 LAB — CBG MONITORING, ED: Glucose-Capillary: 428 mg/dL — ABNORMAL HIGH (ref 70–99)

## 2024-08-04 LAB — HEPATITIS B SURFACE ANTIGEN: Hepatitis B Surface Ag: NONREACTIVE

## 2024-08-04 LAB — PROCALCITONIN: Procalcitonin: 0.87 ng/mL

## 2024-08-04 LAB — GLUCOSE, RANDOM: Glucose, Bld: 556 mg/dL (ref 70–99)

## 2024-08-04 MED ORDER — ORAL CARE MOUTH RINSE: 15.0000 mL | OROMUCOSAL | Status: DC | PRN

## 2024-08-04 MED ORDER — ATORVASTATIN CALCIUM 40 MG PO TABS
80.0000 mg | ORAL_TABLET | Freq: Every day | ORAL | Status: DC
  Administered 2024-08-04 – 2024-08-05 (×2): 80 mg via ORAL
  Filled 2024-08-04 (×2): qty 2

## 2024-08-04 MED ORDER — ISOSORBIDE MONONITRATE ER 30 MG PO TB24
15.0000 mg | ORAL_TABLET | Freq: Every day | ORAL | Status: DC
  Administered 2024-08-04: 15 mg via ORAL
  Filled 2024-08-04: qty 1

## 2024-08-04 MED ORDER — PENTAFLUOROPROP-TETRAFLUOROETH EX AERO
INHALATION_SPRAY | CUTANEOUS | Status: AC
  Filled 2024-08-04: qty 30

## 2024-08-04 MED ORDER — LIDOCAINE-PRILOCAINE 2.5-2.5 % EX CREA: 1.0000 | TOPICAL_CREAM | CUTANEOUS | Status: DC | PRN

## 2024-08-04 MED ORDER — ASPIRIN 81 MG PO TBEC
81.0000 mg | DELAYED_RELEASE_TABLET | Freq: Every day | ORAL | Status: DC
  Administered 2024-08-04 – 2024-08-05 (×2): 81 mg via ORAL
  Filled 2024-08-04 (×2): qty 1

## 2024-08-04 MED ORDER — HEPARIN SODIUM (PORCINE) 1000 UNIT/ML IJ SOLN
INTRAMUSCULAR | Status: AC
  Filled 2024-08-04: qty 4

## 2024-08-04 MED ORDER — INSULIN ASPART 100 UNIT/ML IJ SOLN: 0.0000 [IU] | Freq: Every day | INTRAMUSCULAR | Status: DC

## 2024-08-04 MED ORDER — ACETAMINOPHEN 325 MG PO TABS: 650.0000 mg | ORAL_TABLET | Freq: Four times a day (QID) | ORAL | Status: DC | PRN

## 2024-08-04 MED ORDER — AMLODIPINE BESYLATE 5 MG PO TABS
10.0000 mg | ORAL_TABLET | Freq: Every day | ORAL | Status: DC
  Administered 2024-08-04 – 2024-08-05 (×2): 10 mg via ORAL
  Filled 2024-08-04 (×2): qty 2

## 2024-08-04 MED ORDER — ALUM & MAG HYDROXIDE-SIMETH 200-200-20 MG/5ML PO SUSP
30.0000 mL | ORAL | Status: DC | PRN
  Administered 2024-08-04: 30 mL via ORAL
  Filled 2024-08-04: qty 30

## 2024-08-04 MED ORDER — FUROSEMIDE 40 MG PO TABS
100.0000 mg | ORAL_TABLET | ORAL | Status: DC
  Administered 2024-08-04: 100 mg via ORAL
  Filled 2024-08-04: qty 3

## 2024-08-04 MED ORDER — FUROSEMIDE 40 MG PO TABS
80.0000 mg | ORAL_TABLET | ORAL | Status: DC
  Administered 2024-08-05: 80 mg via ORAL
  Filled 2024-08-04: qty 2

## 2024-08-04 MED ORDER — PROCHLORPERAZINE EDISYLATE 10 MG/2ML IJ SOLN: 5.0000 mg | INTRAMUSCULAR | Status: DC | PRN

## 2024-08-04 MED ORDER — SODIUM CHLORIDE 0.9 % IV SOLN
500.0000 mg | Freq: Once | INTRAVENOUS | Status: AC
  Administered 2024-08-04: 500 mg via INTRAVENOUS
  Filled 2024-08-04: qty 5

## 2024-08-04 MED ORDER — PENTAFLUOROPROP-TETRAFLUOROETH EX AERO
1.0000 | INHALATION_SPRAY | CUTANEOUS | Status: DC | PRN
Start: 2024-08-04 — End: 2024-08-04
  Administered 2024-08-04: 1 via TOPICAL

## 2024-08-04 MED ORDER — IPRATROPIUM-ALBUTEROL 0.5-2.5 (3) MG/3ML IN SOLN
3.0000 mL | Freq: Once | RESPIRATORY_TRACT | Status: AC
  Administered 2024-08-04: 3 mL via RESPIRATORY_TRACT
  Filled 2024-08-04: qty 3

## 2024-08-04 MED ORDER — CARVEDILOL 12.5 MG PO TABS
25.0000 mg | ORAL_TABLET | Freq: Two times a day (BID) | ORAL | Status: DC
  Administered 2024-08-04 – 2024-08-05 (×3): 25 mg via ORAL
  Filled 2024-08-04 (×3): qty 2

## 2024-08-04 MED ORDER — INSULIN ASPART 100 UNIT/ML IJ SOLN
0.0000 [IU] | Freq: Three times a day (TID) | INTRAMUSCULAR | Status: DC
  Administered 2024-08-04: 5 [IU] via SUBCUTANEOUS
  Administered 2024-08-04: 6 [IU] via SUBCUTANEOUS
  Administered 2024-08-05: 5 [IU] via SUBCUTANEOUS
  Filled 2024-08-04: qty 1

## 2024-08-04 MED ORDER — LIDOCAINE HCL (PF) 1 % IJ SOLN: 5.0000 mL | INTRAMUSCULAR | Status: DC | PRN

## 2024-08-04 MED ORDER — CEFTRIAXONE SODIUM 1 G IJ SOLR
1.0000 g | Freq: Once | INTRAMUSCULAR | Status: AC
  Administered 2024-08-04: 1 g via INTRAVENOUS
  Filled 2024-08-04: qty 10

## 2024-08-04 MED ORDER — ACETAMINOPHEN 650 MG RE SUPP: 650.0000 mg | Freq: Four times a day (QID) | RECTAL | Status: DC | PRN

## 2024-08-04 MED ORDER — INSULIN GLARGINE 100 UNIT/ML ~~LOC~~ SOLN
5.0000 [IU] | Freq: Every day | SUBCUTANEOUS | Status: DC
  Administered 2024-08-04: 5 [IU] via SUBCUTANEOUS
  Filled 2024-08-04 (×2): qty 0.05

## 2024-08-04 MED ORDER — INSULIN GLARGINE-YFGN 100 UNIT/ML ~~LOC~~ SOLN: 5.0000 [IU] | Freq: Every day | SUBCUTANEOUS | Status: DC

## 2024-08-04 MED ORDER — SODIUM CHLORIDE 0.9% FLUSH
3.0000 mL | Freq: Two times a day (BID) | INTRAVENOUS | Status: DC
  Administered 2024-08-04 – 2024-08-05 (×3): 3 mL via INTRAVENOUS

## 2024-08-04 MED ORDER — INSULIN ASPART 100 UNIT/ML IJ SOLN
10.0000 [IU] | Freq: Once | INTRAMUSCULAR | Status: AC
  Administered 2024-08-04: 10 [IU] via SUBCUTANEOUS

## 2024-08-04 MED ORDER — GABAPENTIN 100 MG PO CAPS
100.0000 mg | ORAL_CAPSULE | Freq: Three times a day (TID) | ORAL | Status: DC
  Administered 2024-08-04 – 2024-08-05 (×4): 100 mg via ORAL
  Filled 2024-08-04 (×4): qty 1

## 2024-08-04 MED ORDER — HEPARIN SODIUM (PORCINE) 1000 UNIT/ML DIALYSIS
20.0000 [IU]/kg | INTRAMUSCULAR | Status: DC | PRN
  Administered 2024-08-04: 3100 [IU] via INTRAVENOUS_CENTRAL

## 2024-08-04 MED ORDER — CLONIDINE HCL 0.2 MG PO TABS
0.3000 mg | ORAL_TABLET | Freq: Three times a day (TID) | ORAL | Status: DC
  Administered 2024-08-04 – 2024-08-05 (×4): 0.3 mg via ORAL
  Filled 2024-08-04 (×4): qty 1

## 2024-08-04 MED ORDER — METHYLPREDNISOLONE SODIUM SUCC 125 MG IJ SOLR
125.0000 mg | Freq: Once | INTRAMUSCULAR | Status: AC
  Administered 2024-08-04: 125 mg via INTRAVENOUS
  Filled 2024-08-04: qty 2

## 2024-08-04 MED ORDER — HEPARIN SODIUM (PORCINE) 5000 UNIT/ML IJ SOLN
5000.0000 [IU] | Freq: Three times a day (TID) | INTRAMUSCULAR | Status: DC
  Administered 2024-08-04 – 2024-08-05 (×4): 5000 [IU] via SUBCUTANEOUS
  Filled 2024-08-04 (×4): qty 1

## 2024-08-04 NOTE — Assessment & Plan Note (Addendum)
 08/04/24 continue SSI. Pt with poor dietary compliance.  08/05/24

## 2024-08-04 NOTE — Progress Notes (Signed)
 Pt receives out-pt HD [facility], TTS, chair time is 0630. Will continue to assist as needed  Kelly Key Dialysis nav [phone] [facility] #- [phone]

## 2024-08-04 NOTE — Consult Note (Addendum)
 KENDEL Key Admit Date: 08/04/2024 08/04/2024 Kelly Key Requesting Physician: Laurence DO  Reason for Consult:   ESRD Comanagement, AHRF, Acute Dyspnea HPI:  61M with ESRD on HD at [facility] on a THS schedule with last treatment 08/02/2024 who was admitted overnight with acute dyspnea and hypoxia.  PMH includes COPD, chronic HFpEF, obesity, DM2, PAD.  He was also seen in the ED on 10/6 with similar symptoms and treated with prednisone  for potential COPD.    In the ED patient was hypoxic with an SpO2 of 88% on room air.  Chest x-ray which has been independently reviewed by me and findings consistent with pulmonary vascular congestion and mild pulmonary edema.  He has been treated with oxygen therapy, Solu-Medrol , and started on antibiotics with ceftriaxone  and azithromycin.  Metabolic panel reviewed with K of 4.9, bicarbonate 26, BUN 78.  CBC reviewed with WBC of 12.4 and hemoglobin 9.6.  Outpatient dialysis records not available.  He uses a left upper arm AV fistula.  He reports receiving full treatment on 10/7 but leaving may be 1 kg above his estimated dry weight.  No fever, chills, productive cough.  Balance of 12 systems is negative w/ exceptions as above  PMH  Past Medical History:  Diagnosis Date   Acute diastolic CHF (congestive heart failure) (HCC) 06/11/2012   EF 50-55% [facility])   Chronic kidney disease    COPD (chronic obstructive pulmonary disease) (HCC)    Diabetes mellitus    A1c 11.5 (06/11/2012).   Dyspnea    Gout    Hepatic steatosis 06/11/2012   Elevated LFTs   Hyperlipemia    Malignant hypertension    Microcytic anemia 06/12/2012   Obesity    PSH  Past Surgical History:  Procedure Laterality Date   ABDOMINAL AORTOGRAM W/LOWER EXTREMITY N/A 12/01/2022   Procedure: ABDOMINAL AORTOGRAM W/LOWER EXTREMITY;  Surgeon: Gretta Lonni PARAS, MD;  Location: [facility] INVASIVE CV LAB;  Service: Cardiovascular;  Laterality: N/A;   AMPUTATION Left 12/03/2022    Procedure: LEFT SECOND TOE AMPUTATION;  Surgeon: Harden Jerona GAILS, MD;  Location: [facility] OR;  Service: Orthopedics;  Laterality: Left;   AV FISTULA PLACEMENT Left 11/11/2019   Procedure: ARTERIOVENOUS (AV) FISTULA CREATION LEFT BRACHIOCEPHALIC ARM;  Surgeon: Serene Gaile ORN, MD;  Location: [facility] OR;  Service: Vascular;  Laterality: Left;   COLONOSCOPY WITH PROPOFOL  N/A 09/02/2021   Procedure: COLONOSCOPY WITH PROPOFOL ;  Surgeon: Therisa Bi, MD;  Location: [facility] ENDOSCOPY;  Service: Gastroenterology;  Laterality: N/A;   IR FLUORO GUIDE CV LINE RIGHT  07/08/2019   IR REMOVAL TUN CV CATH W/O FL  03/23/2020   IR US  GUIDE VASC ACCESS RIGHT  07/08/2019   LEFT HEART CATH AND CORONARY ANGIOGRAPHY N/A 06/12/2021   Procedure: LEFT HEART CATH AND CORONARY ANGIOGRAPHY;  Surgeon: Claudene Victory ORN, MD;  Location: [facility] INVASIVE CV LAB;  Service: Cardiovascular;  Laterality: N/A;   NO PAST SURGERIES     PERIPHERAL VASCULAR BALLOON ANGIOPLASTY Left 12/01/2022   Procedure: PERIPHERAL VASCULAR BALLOON ANGIOPLASTY;  Surgeon: Gretta Lonni PARAS, MD;  Location: [facility] INVASIVE CV LAB;  Service: Cardiovascular;  Laterality: Left;  Anterior tibial   PERIPHERAL VASCULAR INTERVENTION Left 12/01/2022   Procedure: PERIPHERAL VASCULAR INTERVENTION;  Surgeon: Gretta Lonni PARAS, MD;  Location: [facility] INVASIVE CV LAB;  Service: Cardiovascular;  Laterality: Left;  sfa   VASCULAR SURGERY     FH  Family History  Problem Relation Age of Onset   Gout Mother    Asthma Mother  Diabetes Father    Heart failure Father    Diabetes Sister    Hypertension Brother    Pancreatic cancer Brother    Diabetes Sister    SH  reports that he has never smoked. He has been exposed to tobacco smoke. He has never used smokeless tobacco. He reports that he does not currently use alcohol. He reports that he does not use drugs. Allergies  Allergies  Allergen Reactions   Ozempic  (0.25 Or 0.5 Mg-Dose) [Semaglutide (0.25 Or 0.5mg -Dos)] Shortness Of Breath    Hydralazine  Anxiety    Hallucinations    Lisinopril Swelling   Home medications Prior to Admission medications   Medication Sig Start Date End Date Taking? Authorizing Provider  acetaminophen  (TYLENOL ) 500 MG tablet Take 1,000 mg by mouth every 6 (six) hours as needed for moderate pain or headache.    [provider]  albuterol  (VENTOLIN  HFA) 108 (90 Base) MCG/ACT inhaler Inhale 2 puffs into the lungs every 4 (four) hours as needed for wheezing or shortness of breath. 08/01/24   Yolande Lamar BROCKS, MD  allopurinol  (ZYLOPRIM ) 100 MG tablet Take 2 tablets (200 mg total) by mouth daily. 08/04/19   Love, Sharlet RAMAN, PA-C  AMBULATORY NON FORMULARY MEDICATION Inject 0.2 ml by intracavernosal route as directed.  Medication Name: TriMix PGE 30 mcg Pap   30 mg Phent 1 mg 07/01/23   Sherrilee Belvie CROME, MD  amLODipine  (NORVASC ) 10 MG tablet Take 1 tablet (10 mg total) by mouth daily. 04/04/24   Miriam Norris, NP  aspirin  EC 81 MG tablet Take 1 tablet (81 mg total) by mouth daily. 05/06/23   Pearlean Manus, MD  atorvastatin  (LIPITOR ) 80 MG tablet Take 1 tablet (80 mg total) by mouth daily. 06/13/24   Miriam Norris, NP  carvedilol  (COREG ) 25 MG tablet Take 1 tablet (25 mg total) by mouth 2 (two) times daily with a meal. 05/06/23   Emokpae, Courage, MD  cloNIDine  (CATAPRES ) 0.3 MG tablet Take 1 tablet (0.3 mg total) by mouth 3 (three) times daily. 05/06/23   Pearlean Manus, MD  clopidogrel  (PLAVIX ) 75 MG tablet Take 75 mg by mouth daily. 08/26/23   [provider]  esomeprazole (NEXIUM) 20 MG capsule Take 20 mg by mouth daily. 01/23/22   [provider]  ezetimibe  (ZETIA ) 10 MG tablet Take 10 mg by mouth every evening. 01/20/19   [provider]  ferrous sulfate  325 (65 FE) MG tablet Take 1 tablet (325 mg total) by mouth 2 (two) times daily with a meal. 09/30/23 06/20/24  Von Bellis, MD  fluticasone  (FLONASE ) 50 MCG/ACT nasal spray Place 1 spray into both nostrils daily as  needed for allergies or rhinitis.    [provider]  furosemide  (LASIX ) 40 MG tablet Please take 2.5 tablets (100 Mg daily) on Dialysis days, and 2 tablets Twice daily on Non Dialysis days (80 Mg Twice Daily) 04/04/24   Miriam Norris, NP  gabapentin  (NEURONTIN ) 100 MG capsule Take 1 capsule (100 mg total) by mouth 3 (three) times daily. 08/03/19   Love, Sharlet RAMAN, PA-C  glucose blood (ACCU-CHEK AVIVA PLUS) test strip Use as instructed TO CHECK BLOOD GLUCOSE THREE TIMES DAILY 08/15/20   Nida, Gebreselassie W, MD  HUMALOG KWIKPEN 100 UNIT/ML KwikPen Inject 8-30 Units into the skin 3 (three) times daily. Sliding scale 30 units max TID 08/19/22   [provider]  insulin  detemir (LEVEMIR  FLEXTOUCH) 100 UNIT/ML FlexPen Inject 50 Units into the skin at bedtime. 05/06/23   Emokpae, Courage,  MD  Insulin  Pen Needle (B-D ULTRAFINE III SHORT PEN) 31G X 8 MM MISC 1 each by Does not apply route as directed. 08/13/20   Nida, Gebreselassie W, MD  Ipratropium-Albuterol  (COMBIVENT ) 20-100 MCG/ACT AERS respimat Inhale 1 puff into the lungs every 6 (six) hours as needed for wheezing or shortness of breath. 09/30/23 06/20/24  Von Bellis, MD  isosorbide  mononitrate (IMDUR ) 30 MG 24 hr tablet Take 0.5 tablets (15 mg total) by mouth daily with supper. 04/04/24 07/03/24  Miriam Norris, NP  linaclotide  (LINZESS ) 145 MCG CAPS capsule Take 145 mcg by mouth daily before breakfast.    [provider]  multivitamin (RENA-VIT) TABS tablet Take 1 tablet by mouth daily. 06/16/24   [provider]  predniSONE  (DELTASONE ) 20 MG tablet Take 2 tablets (40 mg total) by mouth daily. 08/01/24   Yolande Lamar BROCKS, MD  sevelamer  carbonate (RENVELA ) 800 MG tablet Take 3 tablets (2,400 mg total) by mouth 3 (three) times daily with meals. Patient taking differently: Take 2,400 mg by mouth 3 (three) times daily with meals. And once daily with snacks 08/03/19   Love, Pamela S, PA-C  VELPHORO  500 MG chewable tablet  Chew 500 mg by mouth 3 (three) times daily. 08/13/23   [provider]    Current Medications Scheduled Meds:  amLODipine   10 mg Oral Daily   aspirin  EC  81 mg Oral Daily   atorvastatin   80 mg Oral Daily   carvedilol   25 mg Oral BID WC   cloNIDine   0.3 mg Oral TID   furosemide   100 mg Oral Once per day on Tuesday Thursday Saturday   [START ON 08/05/2024] furosemide   80 mg Oral 2 times per day on Sunday Monday Wednesday Friday   gabapentin   100 mg Oral TID   heparin   5,000 Units Subcutaneous Q8H   insulin  aspart  0-5 Units Subcutaneous QHS   insulin  aspart  0-6 Units Subcutaneous TID WC   insulin  glargine  5 Units Subcutaneous Daily   isosorbide  mononitrate  15 mg Oral Q supper   sodium chloride  flush  3 mL Intravenous Q12H   Continuous Infusions:  sodium chloride      PRN Meds:.sodium chloride , acetaminophen  **OR** acetaminophen , heparin , lidocaine  (PF), lidocaine -prilocaine , pentafluoroprop-tetrafluoroeth, prochlorperazine   CBC Recent Labs  Lab 08/01/24 2124 08/04/24 0330  WBC 7.5 12.4*  NEUTROABS  --  9.5*  HGB 9.0* 9.6*  HCT 28.1* 29.4*  MCV 91.8 91.3  PLT 214 228   Basic Metabolic Panel Recent Labs  Lab 08/01/24 2124 08/04/24 0330  NA 137 131*  K 4.4 4.9  CL 97* 90*  CO2 26 26  GLUCOSE 121* 466*  BUN 72* 78*  CREATININE 12.10* 10.60*  CALCIUM  9.4 9.4    Physical Exam  Blood pressure (!) 171/98, pulse 84, temperature 97.6 F (36.4 C), temperature source Oral, resp. rate (!) 21, height 5' 9 (1.753 m), weight (!) 155.9 kg, SpO2 92%. GEN: Obese male, mild respiratory distress, sitting on edge of bed ENT: Nasal cannula in place, NCAT EYES: EOMI CV: Regular, no rub, S1 and S2 present PULM: Diminished in the bases bilaterally, scattered crackles throughout ABD: Protuberant, obese, soft VASCULAR: Left upper arm AV fistula with bruit and thrill, aneurysmal, no ulcers EXT: 1+ lower extremity edema  Assessment 72M ESRD THS [facility] LUE AVF with  AHRF, acute dyspnea, hypertensive emergency  AHRF: Chest x-ray with findings of pulmonary congestion and acute CHF.  Volume overload likely dominant cause.  Dialysis now, going for 5L Acute  on Chronic HFpEF: As above, volume overloaded. ESRD on HD THS [facility]: HD today, 4 hours, goal UF 5L.  Tight heparin , use AVF Hypertension with emergency: As per # 1, 2, 3.  Trend blood pressures with volume unloading, no escalation of blood pressure medications yet. 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  Kelly Key  08/04/2024, 9:41 AM

## 2024-08-04 NOTE — Assessment & Plan Note (Addendum)
 08/04/24 due to demand ischemia from volume overload/acute on chronic diastolic CHF. NOT NSTEMI.  08/05/24 stable.

## 2024-08-04 NOTE — ED Notes (Signed)
 Patient to XR

## 2024-08-04 NOTE — Assessment & Plan Note (Addendum)
 08/04/24 will need intra-vascular volume management with dialysis. Nephrology consulted.  08/05/24 Had 5 L fluid removed with HD yesterday. Weaned to RA. Admission weight 343 lbs. Pt had 5 Liter of fluid removed yesterday. Discharge weight 335 lbs. Continue norvasc , lasix , coreg , clonidine , imdur .  Weight Information (last 2 days) before discharge     Date/Time Weight Weight in lbs BSA (Calculated - sq m) BMI (Calculated) Who   08/04/24 1547 152 kg 335.1 lbs 2.72 sq meters 49.46 KM   08/04/24 0258 155.9 kg 343.7 lbs 2.75 sq meters 50.73 KN

## 2024-08-04 NOTE — Assessment & Plan Note (Addendum)
 08/04/24 prn CPAP  08/05/24 stable.

## 2024-08-04 NOTE — TOC Initial Note (Signed)
 Transition of Care [facility]) - Initial/Assessment Note    Patient Details  Name: Kelly Key MRN: [id number] Date of Birth: 10/12/1971  Transition of Care [facility] CM/SW Contact:    Noreen KATHEE Pinal, LCSWA Phone Number: 08/04/2024, 10:30 AM  Clinical Narrative:                   Patient is at risk for readmission . Patient was admitted for Acute respiratory failure with hypoxia. CSW spoke with patient at bedside in HD. Patient reports that it is him and his fiance in the home . They he is independent sometimes with her assistance. He reports that he has a cane and walker at home , but no oxygen. Patient reports that he is still able to drive and currently does not use or have any in-home services. CSW will continue to follow and assist with getting home oxygen if saturation qualification is accurate.   Expected Discharge Plan: Home/Self Care Barriers to Discharge: Continued Medical Work up   Patient Goals and CMS Choice Patient states their goals for this hospitalization and ongoing recovery are:: return back home CMS Medicare.gov Compare Post Acute Care list provided to:: Patient        Expected Discharge Plan and Services In-house Referral: Clinical Social Work   Post Acute Care Choice: Durable Medical Equipment Living arrangements for the past 2 months: Single Family Home                                      Prior Living Arrangements/Services Living arrangements for the past 2 months: Single Family Home Lives with:: Significant Other Patient language and need for interpreter reviewed:: Yes Do you feel safe going back to the place where you live?: Yes      Need for Family Participation in Patient Care: Yes (Comment) Care giver support system in place?: Yes (comment) Current home services: DME Criminal Activity/Legal Involvement Pertinent to Current Situation/Hospitalization: No - Comment as needed  Activities of Daily Living      Permission Sought/Granted       Share Information with NAME: [name]     Permission granted to share info w Relationship: Patient     Emotional Assessment Appearance:: Appears stated age Attitude/Demeanor/Rapport: Engaged Affect (typically observed): Accepting Orientation: : Oriented to Self, Oriented to Place, Oriented to  Time, Oriented to Situation Alcohol / Substance Use: Not Applicable Psych Involvement: No (comment)  Admission diagnosis:  COPD exacerbation (HCC) [J44.1] Acute respiratory failure with hypoxia (HCC) [J96.01] ESRD on hemodialysis (HCC) [N18.6, Z99.2] Community acquired pneumonia of right lower lobe of lung [J18.9] Patient Active Problem List   Diagnosis Date Noted   Acute respiratory failure with hypoxia (HCC) 08/04/2024   Fluid overload 09/29/2023   CHF (congestive heart failure) (HCC) 09/29/2023   Hypertensive urgency 09/22/2023   Elevated troponin 09/22/2023   ESRD (end stage renal disease) on dialysis (HCC) 05/06/2023   Hyperkalemia 05/05/2023   PAD (peripheral artery disease) 12/01/2022   Gangrene of toe of left foot (HCC) 11/28/2022   Type 2 diabetes mellitus with obesity 11/27/2022   GERD (gastroesophageal reflux disease) 11/27/2022   Flash pulmonary edema (HCC) 11/04/2022   Dysphagia 10/18/2022   Osteomyelitis of second toe of left foot (HCC) 11/13/2021   Osteomyelitis (HCC) 11/13/2021   OSA (obstructive sleep apnea) 10/04/2021   Obesity hypoventilation syndrome (HCC) 10/04/2021   Organic impotence 08/16/2021   NSTEMI (non-ST elevated myocardial  infarction) (HCC) 05/17/2021   Chronic heart failure with preserved ejection fraction (HCC) 08/31/2020   Hypertension secondary to other renal disorders 08/31/2020   Morbid obesity with BMI of 45.0-49.9, adult (HCC) 08/31/2020   Acute gout of right knee 08/03/2019   Orthostasis    Pain    Physical debility 07/16/2019   ESRD (end stage renal disease) (HCC)    Anemia of chronic disease    Uncontrolled type 2 diabetes mellitus with  hyperglycemia (HCC)    Volume overload 07/05/2019   Anasarca 07/03/2019   Dyslipidemia 07/03/2019   Chronic combined systolic and diastolic CHF (congestive heart failure) (HCC) 07/03/2019   Bilateral leg edema    Hypokalemia    Acute on chronic diastolic HF (heart failure) (HCC) 05/05/2019   Thromboembolism (HCC) 02/20/2019   Acute on chronic diastolic CHF (congestive heart failure) (HCC) 03/26/2018   Hyponatremia 06/12/2012   Microcytic anemia 06/12/2012   Essential hypertension, benign 06/11/2012   Elevated LFTs 06/11/2012   Morbid obesity (HCC) 06/11/2012   Hepatic steatosis 06/11/2012   PCP:  Luke Agent, MD (Inactive) Pharmacy:   [facility] [address] [place] - [address] [place] [address] Phone: [phone] Fax: [phone]     Social Drivers of Health (SDOH) Social History: SDOH Screenings   Food Insecurity: No Food Insecurity (09/29/2023)  Housing: Low Risk  (09/29/2023)  Transportation Needs: No Transportation Needs (09/29/2023)  Utilities: Not At Risk (09/29/2023)  Depression (PHQ2-9): Low Risk  (12/25/2021)  Financial Resource Strain: Low Risk  (02/20/2019)  Physical Activity: Unknown (02/20/2019)  Social Connections: Unknown (02/20/2019)  Stress: No Stress Concern Present (02/20/2019)  Tobacco Use: Low Risk  (08/04/2024)   SDOH Interventions:     Readmission Risk Interventions    08/04/2024   10:28 AM 09/29/2023   11:45 AM 01/12/2023    1:18 PM  Readmission Risk Prevention Plan  Transportation Screening Complete Complete   PCP or Specialist Appt within 3-5 Days  Complete   HRI or Home Care Consult Complete Complete   Social Work Consult for Recovery Care Planning/Counseling Complete Complete   Palliative Care Screening Not Applicable Complete   Medication Review Oceanographer) Complete Complete Complete  HRI or Home Care Consult   Complete  SW Recovery Care/Counseling Consult   Complete  Palliative Care Screening   Not Applicable   Skilled Nursing Facility   Not Applicable

## 2024-08-04 NOTE — Progress Notes (Signed)
 PROGRESS NOTE    [name]  FMW:[id number] DOB: 04/02/1971 DOA: 08/04/2024 PCP: Luke Agent, MD (Inactive)  Subjective: Patient seen and examined.  Patient admits to drinking at least 60 ounces of water a day.  He understands that he is causing his own volume overload by consuming too much liquid.  He dialyzes Tuesday Thursday and Saturday.  Nephrology consulted for additional volume removal with dialysis.   Hospital Course: CC: SOB HPI: PATRICIA Key is a 53 y.o. male with medical history significant for hypertension, insulin -dependent diabetes mellitus, PAD, chronic anemia, COPD, and chronic HFpEF presents with shortness of breath.   Patient was seen in the emergency department on 08/01/2024 with shortness of breath, was suspected to have COPD exacerbation, and was discharged home with prednisone .  He then had dialysis the following morning, his condition improved, but he has worsened again overnight.  He reports progressive dyspnea overnight with new orthopnea.  He reports feeling like he did when he had too much fluid previously.  He has not noticed any leg swelling.  He has had a mild cough with no sputum production.  He denies rhinorrhea or sore throat.  He denies any recent chest pain.  He used inhalers at home without any appreciable relief.   ED Course: Upon arrival to the ED, patient is found to be afebrile and saturating 88% on room air with normal HR and elevated BP.  Labs are most notable for glucose 166, BUN 78, WBC 12,400, hemoglobin 9.6, and troponin 109.  Chest x-ray is concerning for worsening vascular congestion and mild pulmonary edema.   Blood culture was collected in the ED and the patient was treated with supplemental oxygen, IV Solu-Medrol , Rocephin , azithromycin, and DuoNeb.  Significant Events: Admitted 08/04/2024 acute on chronic diastolic CHF   Admission Labs: WBC 12.4, HgB 9.6, plt 228 Na 131, K 4.9, CO2 of 26, BUN 78, Scr 10.6, glu 466 Procalcitonin  0.87  Admission Imaging Studies: CXR Worsening CHF with new right basilar atelectasis.   Significant Labs:   Significant Imaging Studies:   Antibiotic Therapy: Anti-infectives (From admission, onward)    Start     Dose/Rate Route Frequency Ordered Stop   08/04/24 0415  cefTRIAXone  (ROCEPHIN ) 1 g in sodium chloride  0.9 % 100 mL IVPB        1 g 200 mL/hr over 30 Minutes Intravenous  Once 08/04/24 0406 08/04/24 0508   08/04/24 0415  azithromycin (ZITHROMAX) 500 mg in sodium chloride  0.9 % 250 mL IVPB        500 mg 250 mL/hr over 60 Minutes Intravenous  Once 08/04/24 0406 08/04/24 0610       Procedures:   Consultants: nephrology    Assessment and Plan: * Acute respiratory failure with hypoxia (HCC) 08/04/24 will need intra-vascular volume management with dialysis. Nephrology consulted. Continue supplemental O2.    Hypervolemia associated with renal insufficiency 08/04/24 will need intra-vascular volume management with dialysis. Nephrology consulted.   Elevated troponin 08/04/24 due to demand ischemia from volume overload/acute on chronic diastolic CHF. NOT NSTEMI.   ESRD (end stage renal disease) on dialysis (HCC) 08/04/24 will need intra-vascular volume management with dialysis. Nephrology consulted.   Acute on chronic diastolic CHF (congestive heart failure) (HCC) 08/04/24 will need intra-vascular volume management with dialysis. Nephrology consulted.   Super obesity 08/04/24 Body mass index is 50.76 kg/m.    OSA (obstructive sleep apnea) 08/04/24 prn CPAP   Uncontrolled type 2 diabetes mellitus with hyperglycemia (HCC) 08/04/24 continue SSI. Pt with  poor dietary compliance.   DVT prophylaxis: heparin  injection 5,000 Units Start: 08/04/24 0600    Code Status: Full Code Family Communication: no family at bedside. Pt is decisional. Disposition Plan: return home Reason for continuing need for hospitalization: needs HD.  Objective: Vitals:    08/04/24 1051 08/04/24 1100 08/04/24 1115 08/04/24 1130  BP: (!) 167/98 (!) 168/98 (!) 187/113 (!) 154/72  Pulse: 73 73 71 71  Resp: 18 17 13 16   Temp:      TempSrc:      SpO2: 99% 100% 100% 98%  Weight:      Height:       No intake or output data in the 24 hours ending 08/04/24 1138 Filed Weights   08/04/24 0258  Weight: (!) 155.9 kg    Examination:  Physical Exam Vitals and nursing note reviewed.  Constitutional:      General: He is not in acute distress.    Appearance: He is obese. He is not toxic-appearing.  HENT:     Head: Normocephalic and atraumatic.  Cardiovascular:     Rate and Rhythm: Normal rate and regular rhythm.  Pulmonary:     Effort: Respiratory distress present.     Breath sounds: No wheezing.     Comments: Mild respiratory distress Abdominal:     General: Bowel sounds are normal.     Palpations: Abdomen is soft.  Musculoskeletal:     Right lower leg: Edema present.     Left lower leg: Edema present.  Skin:    General: Skin is warm and dry.     Capillary Refill: Capillary refill takes less than 2 seconds.  Neurological:     Mental Status: He is oriented to person, place, and time.     Data Reviewed: I have personally reviewed following labs and imaging studies  CBC: Recent Labs  Lab 08/01/24 2124 08/04/24 0330  WBC 7.5 12.4*  NEUTROABS  --  9.5*  HGB 9.0* 9.6*  HCT 28.1* 29.4*  MCV 91.8 91.3  PLT 214 228   Basic Metabolic Panel: Recent Labs  Lab 08/01/24 2124 08/04/24 0330  NA 137 131*  K 4.4 4.9  CL 97* 90*  CO2 26 26  GLUCOSE 121* 466*  BUN 72* 78*  CREATININE 12.10* 10.60*  CALCIUM  9.4 9.4   GFR: Estimated Creatinine Clearance: 11.7 mL/min (A) (by C-G formula based on SCr of 10.6 mg/dL (H)). BNP (last 3 results) Recent Labs    11/30/23 1320 01/08/24 1042 04/01/24 0938  BNP 244.0* 143.0* 92.5   CBG: Recent Labs  Lab 08/04/24 0856  GLUCAP 428*   Sepsis Labs: Recent Labs  Lab 08/04/24 0330  PROCALCITON 0.87     Recent Results (from the past 240 hours)  Culture, blood (routine x 2)     Status: None (Preliminary result)   Collection Time: 08/04/24  3:30 AM   Specimen: BLOOD  Result Value Ref Range Status   Specimen Description BLOOD RIGHT ANTECUBITAL  Final   Special Requests   Final    BOTTLES DRAWN AEROBIC AND ANAEROBIC Blood Culture adequate volume   Culture   Final    NO GROWTH < 12 HOURS Performed at [facility], [address], [place], [place] [address]    Report Status PENDING  Incomplete     Radiology Studies: DG Chest 2 View Result Date: 08/04/2024 CLINICAL DATA:  Shortness of breath EXAM: CHEST - 2 VIEW COMPARISON:  08/01/2024 FINDINGS: Cardiac shadow is mildly prominent but accentuated by the frontal  technique. Central vascular congestion is noted increased from the prior exam with mild edema. Increasing right basilar atelectasis is noted. IMPRESSION: Worsening CHF with new right basilar atelectasis. Electronically Signed   By: Oneil Devonshire M.D.   On: 08/04/2024 03:36    Scheduled Meds:  amLODipine   10 mg Oral Daily   aspirin  EC  81 mg Oral Daily   atorvastatin   80 mg Oral Daily   carvedilol   25 mg Oral BID WC   cloNIDine   0.3 mg Oral TID   furosemide   100 mg Oral Once per day on Tuesday Thursday Saturday   [START ON 08/05/2024] furosemide   80 mg Oral 2 times per day on Sunday Monday Wednesday Friday   gabapentin   100 mg Oral TID   heparin   5,000 Units Subcutaneous Q8H   insulin  aspart  0-5 Units Subcutaneous QHS   insulin  aspart  0-6 Units Subcutaneous TID WC   insulin  glargine  5 Units Subcutaneous Daily   isosorbide  mononitrate  15 mg Oral Q supper   sodium chloride  flush  3 mL Intravenous Q12H   Continuous Infusions:   LOS: 0 days   Time spent: 60 minutes  Camellia Door, DO  [facility]  08/04/2024, 11:38 AM

## 2024-08-04 NOTE — ED Notes (Signed)
 Pt refuses to be stuck for second set of blood cultures, EDP aware

## 2024-08-04 NOTE — ED Notes (Signed)
 Pt O2 sat on room air 88%, pt placed on 2L 

## 2024-08-04 NOTE — Procedures (Signed)
 Received patient in bed to unit.  Alert and oriented.  Informed consent signed and in chart.  Received pt from ED via Transporter. Pt very SOB. LUA AVF cannulated x 2 per policy, without difficulty. Secured well with tape. Labs draen and given to lab tech at bedside. Tx initated per MD order. 1138 Pt is resting quietly with eyes closed.  TX duration:4Hours  Tx complete. Blood returned. Needles removed, sites held x 2 until hemostasis achieved. Gauze changed prior to taping.  Patient tolerated well.  Transported back to the room  Alert, without acute distress.  Hand-off given to patient's nurse.   Access used: LUA AVF Access issues: none  Total UF removed: 5000 ml Medication(s) given: See MAR   [facility]

## 2024-08-04 NOTE — ED Triage Notes (Signed)
 Pov from home. Cc of SOB since tonight. Unable to lay down to rest. Tu/Th/Sat dialysis. Did not miss apt Tuesday.

## 2024-08-04 NOTE — Subjective & Objective (Signed)
 Pt seen and examined. Had 5 L fluid removed with HD yesterday. Weaned to RA. Discussed with nephrology. Pt stable for DC today. F/u with outpatient HD tomorrow.  Discussed with pt and his wife that pt is drinking too much water/liquids in between his HD sessions. Discussed that foods he eats also contain water and he needs to account for this when he is consuming foods/beverages. Pt drinks at least 60 ounces of water a day plus all the fruits he eats.  CBG >400 during hospital stay.

## 2024-08-04 NOTE — Assessment & Plan Note (Addendum)
 08/04/24 will need intra-vascular volume management with dialysis. Nephrology consulted. Continue supplemental O2.   08/05/24 weaned to RA.

## 2024-08-04 NOTE — ED Provider Notes (Signed)
 [place] EMERGENCY DEPARTMENT AT [facility]  Provider Note  CSN: [id number] Arrival date & time: 08/04/24 0250  History Chief Complaint  Patient presents with   Shortness of Breath    Kelly Key is a 53 y.o. male with history of ESRD on HD TTS, CHF, HTN, COPD presents for evaluation of SOB, orthopnea, no fever. Seen in the ED for similar on 10/6 with negative workup, ultimately discharged with Rx for prednisone . . He had dialysis on 10/7, due for dialysis in the AM but symptoms worsened this evening prompting re-eval. No reported fever, some wheezing not improved with inhaler.    Home Medications Prior to Admission medications   Medication Sig Start Date End Date Taking? Authorizing Provider  acetaminophen  (TYLENOL ) 500 MG tablet Take 1,000 mg by mouth every 6 (six) hours as needed for moderate pain or headache.    [provider]  albuterol  (VENTOLIN  HFA) 108 (90 Base) MCG/ACT inhaler Inhale 2 puffs into the lungs every 4 (four) hours as needed for wheezing or shortness of breath. 08/01/24   Yolande Lamar BROCKS, MD  allopurinol  (ZYLOPRIM ) 100 MG tablet Take 2 tablets (200 mg total) by mouth daily. 08/04/19   Love, Sharlet RAMAN, PA-C  AMBULATORY NON FORMULARY MEDICATION Inject 0.2 ml by intracavernosal route as directed.  Medication Name: TriMix PGE 30 mcg Pap   30 mg Phent 1 mg 07/01/23   McKenzie, Belvie CROME, MD  amLODipine  (NORVASC ) 10 MG tablet Take 1 tablet (10 mg total) by mouth daily. 04/04/24   Miriam Norris, NP  aspirin  EC 81 MG tablet Take 1 tablet (81 mg total) by mouth daily. 05/06/23   Pearlean Manus, MD  atorvastatin  (LIPITOR ) 80 MG tablet Take 1 tablet (80 mg total) by mouth daily. 06/13/24   Miriam Norris, NP  carvedilol  (COREG ) 25 MG tablet Take 1 tablet (25 mg total) by mouth 2 (two) times daily with a meal. 05/06/23   Emokpae, Courage, MD  cloNIDine  (CATAPRES ) 0.3 MG tablet Take 1 tablet (0.3 mg total) by mouth 3 (three) times daily. 05/06/23    Pearlean Manus, MD  clopidogrel  (PLAVIX ) 75 MG tablet Take 75 mg by mouth daily. 08/26/23   [provider]  esomeprazole (NEXIUM) 20 MG capsule Take 20 mg by mouth daily. 01/23/22   [provider]  ezetimibe  (ZETIA ) 10 MG tablet Take 10 mg by mouth every evening. 01/20/19   [provider]  ferrous sulfate  325 (65 FE) MG tablet Take 1 tablet (325 mg total) by mouth 2 (two) times daily with a meal. 09/30/23 06/20/24  Von Bellis, MD  fluticasone  (FLONASE ) 50 MCG/ACT nasal spray Place 1 spray into both nostrils daily as needed for allergies or rhinitis.    [provider]  furosemide  (LASIX ) 40 MG tablet Please take 2.5 tablets (100 Mg daily) on Dialysis days, and 2 tablets Twice daily on Non Dialysis days (80 Mg Twice Daily) 04/04/24   Miriam Norris, NP  gabapentin  (NEURONTIN ) 100 MG capsule Take 1 capsule (100 mg total) by mouth 3 (three) times daily. 08/03/19   Love, Sharlet RAMAN, PA-C  glucose blood (ACCU-CHEK AVIVA PLUS) test strip Use as instructed TO CHECK BLOOD GLUCOSE THREE TIMES DAILY 08/15/20   Nida, Gebreselassie W, MD  HUMALOG KWIKPEN 100 UNIT/ML KwikPen Inject 8-30 Units into the skin 3 (three) times daily. Sliding scale 30 units max TID 08/19/22   [provider]  insulin  detemir (LEVEMIR  FLEXTOUCH) 100 UNIT/ML FlexPen Inject 50 Units into the skin at  bedtime. 05/06/23   Pearlean Manus, MD  Insulin  Pen Needle (B-D ULTRAFINE III SHORT PEN) 31G X 8 MM MISC 1 each by Does not apply route as directed. 08/13/20   Nida, Gebreselassie W, MD  Ipratropium-Albuterol  (COMBIVENT ) 20-100 MCG/ACT AERS respimat Inhale 1 puff into the lungs every 6 (six) hours as needed for wheezing or shortness of breath. 09/30/23 06/20/24  Von Bellis, MD  isosorbide  mononitrate (IMDUR ) 30 MG 24 hr tablet Take 0.5 tablets (15 mg total) by mouth daily with supper. 04/04/24 07/03/24  Miriam Norris, NP  linaclotide  (LINZESS ) 145 MCG CAPS capsule Take 145 mcg by mouth daily before  breakfast.    [provider]  multivitamin (RENA-VIT) TABS tablet Take 1 tablet by mouth daily. 06/16/24   [provider]  predniSONE  (DELTASONE ) 20 MG tablet Take 2 tablets (40 mg total) by mouth daily. 08/01/24   Yolande Lamar BROCKS, MD  sevelamer  carbonate (RENVELA ) 800 MG tablet Take 3 tablets (2,400 mg total) by mouth 3 (three) times daily with meals. Patient taking differently: Take 2,400 mg by mouth 3 (three) times daily with meals. And once daily with snacks 08/03/19   Love, Pamela S, PA-C  VELPHORO  500 MG chewable tablet Chew 500 mg by mouth 3 (three) times daily. 08/13/23   [provider]     Allergies    Ozempic  (0.25 or 0.5 mg-dose) [semaglutide (0.25 or 0.5mg -dos)], Hydralazine , and Lisinopril   Review of Systems   Review of Systems Please see HPI for pertinent positives and negatives  Physical Exam BP (!) 167/89   Pulse 84   Temp 97.6 F (36.4 C) (Oral)   Resp 20   Ht 5' 9 (1.753 m)   Wt (!) 155.9 kg   SpO2 94%   BMI 50.76 kg/m   Physical Exam Vitals and nursing note reviewed.  Constitutional:      Appearance: Normal appearance.  HENT:     Head: Normocephalic and atraumatic.     Nose: Nose normal.     Mouth/Throat:     Mouth: Mucous membranes are moist.  Eyes:     Extraocular Movements: Extraocular movements intact.     Conjunctiva/sclera: Conjunctivae normal.  Cardiovascular:     Rate and Rhythm: Normal rate.  Pulmonary:     Effort: Pulmonary effort is normal.     Breath sounds: Wheezing and rales present.  Abdominal:     General: Abdomen is flat.     Palpations: Abdomen is soft.     Tenderness: There is no abdominal tenderness.  Musculoskeletal:        General: No swelling. Normal range of motion.     Cervical back: Neck supple.     Right lower leg: Edema present.     Left lower leg: Edema present.     Comments: Dialysis fistula in LUE with palpable thrill  Skin:    General: Skin is warm and dry.  Neurological:      General: No focal deficit present.     Mental Status: He is alert.  Psychiatric:        Mood and Affect: Mood normal.     ED Results / Procedures / Treatments   EKG None  Procedures .Critical Care  Performed by: Roselyn Carlin NOVAK, MD Authorized by: Roselyn Carlin NOVAK, MD   Critical care provider statement:    Critical care time (minutes):  30   Critical care time was exclusive of:  Separately billable procedures and treating other patients   Critical care was necessary  to treat or prevent imminent or life-threatening deterioration of the following conditions:  Respiratory failure   Critical care was time spent personally by me on the following activities:  Development of treatment plan with patient or surrogate, discussions with consultants, evaluation of patient's response to treatment, examination of patient, ordering and review of laboratory studies, ordering and review of radiographic studies, ordering and performing treatments and interventions, pulse oximetry, re-evaluation of patient's condition and review of old charts   Care discussed with: admitting provider     Medications Ordered in the ED Medications  azithromycin (ZITHROMAX) 500 mg in sodium chloride  0.9 % 250 mL IVPB (500 mg Intravenous New Bag/Given 08/04/24 0509)  ipratropium-albuterol  (DUONEB) 0.5-2.5 (3) MG/3ML nebulizer solution 3 mL (3 mLs Nebulization Given 08/04/24 0345)  methylPREDNISolone  sodium succinate (SOLU-MEDROL ) 125 mg/2 mL injection 125 mg (125 mg Intravenous Given 08/04/24 0342)  cefTRIAXone  (ROCEPHIN ) 1 g in sodium chloride  0.9 % 100 mL IVPB (0 g Intravenous Stopped 08/04/24 0508)    Initial Impression and Plan  Patient here with cough SOB and wheezing not improved with inhaler/prednisone  or dialysis in recent days. No reported fever. He is borderline hypoxic here 90-92% on RA. Will check labs, CXR, EKG and give a duoneb.   ED Course   Clinical Course as of 08/04/24 0512  Thu Aug 04, 2024  0338 I  personally viewed the images from radiology studies and agree with radiologist interpretation: CXR with worsening vascular congestion and R basilar atelectasis vs infiltrate. Cultures added for possible CAP.  [CS]  0348 CBC with mild leukocytosis, consider steroids vs infectious.  [CS]  0424 BMP consistent with ESRD, K is normal. Initial Trop is elevated possibly due to ESRD and/or hypoxia. Not having symptoms consistent with ACS.  [CS]  73 Spoke with Dr. Charlton, Hospitalist, who will evaluate for admission.  [CS]    Clinical Course User Index [CS] Roselyn Carlin NOVAK, MD     MDM Rules/Calculators/A&P Medical Decision Making Problems Addressed: Acute respiratory failure with hypoxia [facility]): acute illness or injury Community acquired pneumonia of right lower lobe of lung: acute illness or injury COPD exacerbation [facility]): chronic illness or injury with exacerbation, progression, or side effects of treatment ESRD on hemodialysis [facility]): chronic illness or injury  Amount and/or Complexity of Data Reviewed Labs: ordered. Decision-making details documented in ED Course. Radiology: ordered and independent interpretation performed. Decision-making details documented in ED Course. ECG/medicine tests: ordered and independent interpretation performed. Decision-making details documented in ED Course.  Risk Prescription drug management. Decision regarding hospitalization.     Final Clinical Impression(s) / ED Diagnoses Final diagnoses:  Community acquired pneumonia of right lower lobe of lung  COPD exacerbation (HCC)  ESRD on hemodialysis (HCC)  Acute respiratory failure with hypoxia [facility])    Rx / DC Orders ED Discharge Orders     None        Roselyn Carlin NOVAK, MD 08/04/24 [phone]

## 2024-08-04 NOTE — H&P (Signed)
 History and Physical    Kelly Key FMW:[id number] DOB: 17-Nov-1970 DOA: 08/04/2024  PCP: Luke Agent, MD (Inactive)   Patient coming from: Home   Chief Complaint: SOB   HPI: Kelly Key is a 53 y.o. male with medical history significant for hypertension, insulin -dependent diabetes mellitus, PAD, chronic anemia, COPD, and chronic HFpEF presents with shortness of breath.  Patient was seen in the emergency department on 08/01/2024 with shortness of breath, was suspected to have COPD exacerbation, and was discharged home with prednisone .  He then had dialysis the following morning, his condition improved, but he has worsened again overnight.  He reports progressive dyspnea overnight with new orthopnea.  He reports feeling like he did when he had too much fluid previously.  He has not noticed any leg swelling.  He has had a mild cough with no sputum production.  He denies rhinorrhea or sore throat.  He denies any recent chest pain.  He used inhalers at home without any appreciable relief.  ED Course: Upon arrival to the ED, patient is found to be afebrile and saturating 88% on room air with normal HR and elevated BP.  Labs are most notable for glucose 166, BUN 78, WBC 12,400, hemoglobin 9.6, and troponin 109.  Chest x-ray is concerning for worsening vascular congestion and mild pulmonary edema.  Blood culture was collected in the ED and the patient was treated with supplemental oxygen, IV Solu-Medrol , Rocephin , azithromycin, and DuoNeb.  Review of Systems:  All other systems reviewed and apart from HPI, are negative.  Past Medical History:  Diagnosis Date   Acute diastolic CHF (congestive heart failure) (HCC) 06/11/2012   EF 50-55% [facility])   Chronic kidney disease    COPD (chronic obstructive pulmonary disease) (HCC)    Diabetes mellitus    A1c 11.5 (06/11/2012).   Dyspnea    Gout    Hepatic steatosis 06/11/2012   Elevated LFTs   Hyperlipemia    Malignant hypertension     Microcytic anemia 06/12/2012   Obesity     Past Surgical History:  Procedure Laterality Date   ABDOMINAL AORTOGRAM W/LOWER EXTREMITY N/A 12/01/2022   Procedure: ABDOMINAL AORTOGRAM W/LOWER EXTREMITY;  Surgeon: Gretta Lonni PARAS, MD;  Location: [facility] INVASIVE CV LAB;  Service: Cardiovascular;  Laterality: N/A;   AMPUTATION Left 12/03/2022   Procedure: LEFT SECOND TOE AMPUTATION;  Surgeon: Harden Jerona GAILS, MD;  Location[facility];  Service: Orthopedics;  Laterality: Left;   AV FISTULA PLACEMENT Left 11/11/2019   Procedure: ARTERIOVENOUS (AV) FISTULA CREATION LEFT BRACHIOCEPHALIC ARM;  Surgeon: Serene Gaile ORN, MD;  Location: [facility] OR;  Service: Vascular;  Laterality: Left;   COLONOSCOPY WITH PROPOFOL  N/A 09/02/2021   Procedure: COLONOSCOPY WITH PROPOFOL ;  Surgeon: Therisa Bi, MD;  Location: [facility] ENDOSCOPY;  Service: Gastroenterology;  Laterality: N/A;   IR FLUORO GUIDE CV LINE RIGHT  07/08/2019   IR REMOVAL TUN CV CATH W/O FL  03/23/2020   IR US  GUIDE VASC ACCESS RIGHT  07/08/2019   LEFT HEART CATH AND CORONARY ANGIOGRAPHY N/A 06/12/2021   Procedure: LEFT HEART CATH AND CORONARY ANGIOGRAPHY;  Surgeon: Claudene Victory ORN, MD;  Location: [facility] INVASIVE CV LAB;  Service: Cardiovascular;  Laterality: N/A;   NO PAST SURGERIES     PERIPHERAL VASCULAR BALLOON ANGIOPLASTY Left 12/01/2022   Procedure: PERIPHERAL VASCULAR BALLOON ANGIOPLASTY;  Surgeon: Gretta Lonni PARAS, MD;  Location: [facility] INVASIVE CV LAB;  Service: Cardiovascular;  Laterality: Left;  Anterior tibial   PERIPHERAL VASCULAR INTERVENTION Left 12/01/2022  Procedure: PERIPHERAL VASCULAR INTERVENTION;  Surgeon: Gretta Lonni PARAS, MD;  Location: [facility] INVASIVE CV LAB;  Service: Cardiovascular;  Laterality: Left;  sfa   VASCULAR SURGERY      Social History:   reports that he has never smoked. He has been exposed to tobacco smoke. He has never used smokeless tobacco. He reports that he does not currently use alcohol. He reports that he does not use  drugs.  Allergies  Allergen Reactions   Ozempic  (0.25 Or 0.5 Mg-Dose) [Semaglutide (0.25 Or 0.5mg -Dos)] Shortness Of Breath   Hydralazine  Anxiety    Hallucinations    Lisinopril Swelling    Family History  Problem Relation Age of Onset   Gout Mother    Asthma Mother    Diabetes Father    Heart failure Father    Diabetes Sister    Hypertension Brother    Pancreatic cancer Brother    Diabetes Sister      Prior to Admission medications   Medication Sig Start Date End Date Taking? Authorizing Provider  acetaminophen  (TYLENOL ) 500 MG tablet Take 1,000 mg by mouth every 6 (six) hours as needed for moderate pain or headache.    [provider]  albuterol  (VENTOLIN  HFA) 108 (90 Base) MCG/ACT inhaler Inhale 2 puffs into the lungs every 4 (four) hours as needed for wheezing or shortness of breath. 08/01/24   Yolande Lamar BROCKS, MD  allopurinol  (ZYLOPRIM ) 100 MG tablet Take 2 tablets (200 mg total) by mouth daily. 08/04/19   Love, Sharlet RAMAN, PA-C  AMBULATORY NON FORMULARY MEDICATION Inject 0.2 ml by intracavernosal route as directed.  Medication Name: TriMix PGE 30 mcg Pap   30 mg Phent 1 mg 07/01/23   McKenzie, Belvie CROME, MD  amLODipine  (NORVASC ) 10 MG tablet Take 1 tablet (10 mg total) by mouth daily. 04/04/24   Miriam Norris, NP  aspirin  EC 81 MG tablet Take 1 tablet (81 mg total) by mouth daily. 05/06/23   Pearlean Manus, MD  atorvastatin  (LIPITOR ) 80 MG tablet Take 1 tablet (80 mg total) by mouth daily. 06/13/24   Miriam Norris, NP  carvedilol  (COREG ) 25 MG tablet Take 1 tablet (25 mg total) by mouth 2 (two) times daily with a meal. 05/06/23   Emokpae, Courage, MD  cloNIDine  (CATAPRES ) 0.3 MG tablet Take 1 tablet (0.3 mg total) by mouth 3 (three) times daily. 05/06/23   Pearlean Manus, MD  clopidogrel  (PLAVIX ) 75 MG tablet Take 75 mg by mouth daily. 08/26/23   [provider]  esomeprazole (NEXIUM) 20 MG capsule Take 20 mg by mouth daily. 01/23/22   [provider]  ezetimibe  (ZETIA ) 10 MG tablet Take 10 mg by mouth every evening. 01/20/19   [provider]  ferrous sulfate  325 (65 FE) MG tablet Take 1 tablet (325 mg total) by mouth 2 (two) times daily with a meal. 09/30/23 06/20/24  Von Bellis, MD  fluticasone  (FLONASE ) 50 MCG/ACT nasal spray Place 1 spray into both nostrils daily as needed for allergies or rhinitis.    [provider]  furosemide  (LASIX ) 40 MG tablet Please take 2.5 tablets (100 Mg daily) on Dialysis days, and 2 tablets Twice daily on Non Dialysis days (80 Mg Twice Daily) 04/04/24   Miriam Norris, NP  gabapentin  (NEURONTIN ) 100 MG capsule Take 1 capsule (100 mg total) by mouth 3 (three) times daily. 08/03/19   Love, Sharlet RAMAN, PA-C  glucose blood (ACCU-CHEK AVIVA PLUS) test strip Use as instructed TO CHECK BLOOD GLUCOSE THREE TIMES DAILY 08/15/20  Nida, Gebreselassie W, MD  HUMALOG KWIKPEN 100 UNIT/ML KwikPen Inject 8-30 Units into the skin 3 (three) times daily. Sliding scale 30 units max TID 08/19/22   [provider]  insulin  detemir (LEVEMIR  FLEXTOUCH) 100 UNIT/ML FlexPen Inject 50 Units into the skin at bedtime. 05/06/23   Pearlean Manus, MD  Insulin  Pen Needle (B-D ULTRAFINE III SHORT PEN) 31G X 8 MM MISC 1 each by Does not apply route as directed. 08/13/20   Nida, Gebreselassie W, MD  Ipratropium-Albuterol  (COMBIVENT ) 20-100 MCG/ACT AERS respimat Inhale 1 puff into the lungs every 6 (six) hours as needed for wheezing or shortness of breath. 09/30/23 06/20/24  Von Bellis, MD  isosorbide  mononitrate (IMDUR ) 30 MG 24 hr tablet Take 0.5 tablets (15 mg total) by mouth daily with supper. 04/04/24 07/03/24  Miriam Norris, NP  linaclotide  (LINZESS ) 145 MCG CAPS capsule Take 145 mcg by mouth daily before breakfast.    [provider]  multivitamin (RENA-VIT) TABS tablet Take 1 tablet by mouth daily. 06/16/24   [provider]  predniSONE  (DELTASONE ) 20 MG tablet Take 2 tablets (40 mg  total) by mouth daily. 08/01/24   Yolande Lamar BROCKS, MD  sevelamer  carbonate (RENVELA ) 800 MG tablet Take 3 tablets (2,400 mg total) by mouth 3 (three) times daily with meals. Patient taking differently: Take 2,400 mg by mouth 3 (three) times daily with meals. And once daily with snacks 08/03/19   Love, Pamela S, PA-C  VELPHORO  500 MG chewable tablet Chew 500 mg by mouth 3 (three) times daily. 08/13/23   [provider]    Physical Exam: Vitals:   08/04/24 0328 08/04/24 0347 08/04/24 0400 08/04/24 0430  BP:   (!) 156/92 (!) 167/89  Pulse: 89 82 85 84  Resp: 18 11 19 20   Temp:      TempSrc:      SpO2: (!) 88% 93% 94% 94%  Weight:      Height:        Constitutional: NAD, no pallor or diaphoresis   Eyes: PERTLA, lids and conjunctivae normal ENMT: Mucous membranes are moist. Posterior pharynx clear of any exudate or lesions.   Neck: supple, no masses  Respiratory: Dyspneic with speech. No wheezing.    Cardiovascular: S1 & S2 heard, regular rate and rhythm. Mild lower extremity edema.  Abdomen: No tenderness, soft. Bowel sounds active.  Musculoskeletal: no clubbing / cyanosis. No joint deformity upper and lower extremities.   Skin: no significant rashes, lesions, ulcers. Warm, dry, well-perfused. Neurologic: CN 2-12 grossly intact. Moving all extremities. Alert and oriented.  Psychiatric: Calm. Cooperative.    Labs and Imaging on Admission: I have personally reviewed following labs and imaging studies  CBC: Recent Labs  Lab 08/01/24 2124 08/04/24 0330  WBC 7.5 12.4*  NEUTROABS  --  9.5*  HGB 9.0* 9.6*  HCT 28.1* 29.4*  MCV 91.8 91.3  PLT 214 228   Basic Metabolic Panel: Recent Labs  Lab 08/01/24 2124 08/04/24 0330  NA 137 131*  K 4.4 4.9  CL 97* 90*  CO2 26 26  GLUCOSE 121* 466*  BUN 72* 78*  CREATININE 12.10* 10.60*  CALCIUM  9.4 9.4   GFR: Estimated Creatinine Clearance: 11.7 mL/min (A) (by C-G formula based on SCr of 10.6 mg/dL (H)). Liver Function  Tests: No results for input(s): AST, ALT, ALKPHOS, BILITOT, PROT, ALBUMIN  in the last 168 hours. No results for input(s): LIPASE, AMYLASE in the last 168 hours. No results for input(s): AMMONIA in the last 168 hours.  Coagulation Profile: No results for input(s): INR, PROTIME in the last 168 hours. Cardiac Enzymes: No results for input(s): CKTOTAL, CKMB, CKMBINDEX, TROPONINI in the last 168 hours. BNP (last 3 results) No results for input(s): PROBNP in the last 8760 hours. HbA1C: No results for input(s): HGBA1C in the last 72 hours. CBG: No results for input(s): GLUCAP in the last 168 hours. Lipid Profile: No results for input(s): CHOL, HDL, LDLCALC, TRIG, CHOLHDL, LDLDIRECT in the last 72 hours. Thyroid  Function Tests: No results for input(s): TSH, T4TOTAL, FREET4, T3FREE, THYROIDAB in the last 72 hours. Anemia Panel: No results for input(s): VITAMINB12, FOLATE, FERRITIN, TIBC, IRON, RETICCTPCT in the last 72 hours. Urine analysis:    Component Value Date/Time   COLORURINE STRAW (A) 04/20/2021 1740   APPEARANCEUR CLEAR 04/20/2021 1740   LABSPEC 1.011 04/20/2021 1740   PHURINE 8.0 04/20/2021 1740   GLUCOSEU 150 (A) 04/20/2021 1740   HGBUR NEGATIVE 04/20/2021 1740   BILIRUBINUR NEGATIVE 04/20/2021 1740   KETONESUR NEGATIVE 04/20/2021 1740   PROTEINUR 100 (A) 04/20/2021 1740   UROBILINOGEN 0.2 06/11/2012 0518   NITRITE NEGATIVE 04/20/2021 1740   LEUKOCYTESUR NEGATIVE 04/20/2021 1740   Sepsis Labs: @LABRCNTIP (procalcitonin:4,lacticidven:4) )No results found for this or any previous visit (from the past 240 hours).   Radiological Exams on Admission: DG Chest 2 View Result Date: 08/04/2024 CLINICAL DATA:  Shortness of breath EXAM: CHEST - 2 VIEW COMPARISON:  08/01/2024 FINDINGS: Cardiac shadow is mildly prominent but accentuated by the frontal technique. Central vascular congestion is noted increased from the  prior exam with mild edema. Increasing right basilar atelectasis is noted. IMPRESSION: Worsening CHF with new right basilar atelectasis. Electronically Signed   By: Oneil Devonshire M.D.   On: 08/04/2024 03:36    EKG: Independently reviewed. Sinus rhythm, 1st degree AV block.   Assessment/Plan   1. Acute on chronic diastolic CHF; ESRD; acute hypoxic respiratory failure  - Presents with progressive SOB and orthopnea without sputum production or fevers  - CXR concerning for acute CHF  - Restrict fluids, continue supplemental O2 as needed, check procalcitonin, renally-dose medications, and consult nephrology for dialysis    2. Elevated troponin  - HS troponin is 109  - He has not had any recent chest pain and this likely reflects demand ischemia in setting of acute respiratory failure  - Treat respiratory failure as above, trend troponin, continue ASA and Lipitor     3. Hypertension  - Continue Norvasc , clonidine , and Coreg    4. Type II DM  - Serum glucose is 466 in ED without DKA  - Check CBGs and start with 5 units Lantus  daily and low-intensity SSI  5. COPD  - No wheezing and minimal cough  - Continue breathing treatments as-needed    6. Anemia  - Appears stable   7. OSA  - Encourage CPAP while sleeping     DVT prophylaxis: sq heparin   Code Status: Full  Level of Care: Level of care: Telemetry Family Communication: Wife at bedside  Disposition Plan:  Patient is from: Home  Anticipated d/c is to: Home Anticipated d/c date is: 08/07/24  Patient currently: Pending improved respiratory status  Consults called: Nephrology  Admission status: Inpatient     Evalene GORMAN Sprinkles, MD [facility]  08/04/2024, 5:38 AM

## 2024-08-04 NOTE — Hospital Course (Signed)
 CC: SOB HPI: Kelly Key is a 53 y.o. male with medical history significant for hypertension, insulin -dependent diabetes mellitus, PAD, chronic anemia, COPD, and chronic HFpEF presents with shortness of breath.   Patient was seen in the emergency department on 08/01/2024 with shortness of breath, was suspected to have COPD exacerbation, and was discharged home with prednisone .  He then had dialysis the following morning, his condition improved, but he has worsened again overnight.  He reports progressive dyspnea overnight with new orthopnea.  He reports feeling like he did when he had too much fluid previously.  He has not noticed any leg swelling.  He has had a mild cough with no sputum production.  He denies rhinorrhea or sore throat.  He denies any recent chest pain.  He used inhalers at home without any appreciable relief.   ED Course: Upon arrival to the ED, patient is found to be afebrile and saturating 88% on room air with normal HR and elevated BP.  Labs are most notable for glucose 166, BUN 78, WBC 12,400, hemoglobin 9.6, and troponin 109.  Chest x-ray is concerning for worsening vascular congestion and mild pulmonary edema.   Blood culture was collected in the ED and the patient was treated with supplemental oxygen, IV Solu-Medrol , Rocephin , azithromycin, and DuoNeb.  Significant Events: Admitted 08/04/2024 acute on chronic diastolic CHF 08-04-2024 had HD that removed 5 liters of fluid.  Admission Labs: WBC 12.4, HgB 9.6, plt 228 Na 131, K 4.9, CO2 of 26, BUN 78, Scr 10.6, glu 466 Procalcitonin 0.87  Admission Imaging Studies: CXR Worsening CHF with new right basilar atelectasis.   Significant Labs:   Significant Imaging Studies:   Antibiotic Therapy: Anti-infectives (From admission, onward)    Start     Dose/Rate Route Frequency Ordered Stop   08/04/24 0415  cefTRIAXone  (ROCEPHIN ) 1 g in sodium chloride  0.9 % 100 mL IVPB        1 g 200 mL/hr over 30 Minutes Intravenous   Once 08/04/24 0406 08/04/24 0508   08/04/24 0415  azithromycin (ZITHROMAX) 500 mg in sodium chloride  0.9 % 250 mL IVPB        500 mg 250 mL/hr over 60 Minutes Intravenous  Once 08/04/24 0406 08/04/24 0610       Procedures:   Consultants: nephrology

## 2024-08-04 NOTE — Assessment & Plan Note (Signed)
08/04/24   

## 2024-08-04 NOTE — Plan of Care (Signed)
  Problem: Fluid Volume: Goal: Ability to maintain a balanced intake and output will improve Outcome: Progressing   Problem: Nutritional: Goal: Progress toward achieving an optimal weight will improve Outcome: Progressing   Problem: Tissue Perfusion: Goal: Adequacy of tissue perfusion will improve Outcome: Progressing   Problem: Clinical Measurements: Goal: Ability to maintain clinical measurements within normal limits will improve Outcome: Progressing Goal: Will remain free from infection Outcome: Progressing

## 2024-08-04 NOTE — Assessment & Plan Note (Addendum)
 08/04/24 Body mass index is 50.76 kg/m.

## 2024-08-05 DIAGNOSIS — I5033 Acute on chronic diastolic (congestive) heart failure: Secondary | ICD-10-CM | POA: Diagnosis not present

## 2024-08-05 DIAGNOSIS — J9601 Acute respiratory failure with hypoxia: Secondary | ICD-10-CM | POA: Diagnosis not present

## 2024-08-05 DIAGNOSIS — E8770 Fluid overload, unspecified: Secondary | ICD-10-CM | POA: Diagnosis not present

## 2024-08-05 DIAGNOSIS — N186 End stage renal disease: Secondary | ICD-10-CM | POA: Diagnosis not present

## 2024-08-05 LAB — CBC
HCT: 27.7 % — ABNORMAL LOW (ref 39.0–52.0)
Hemoglobin: 8.9 g/dL — ABNORMAL LOW (ref 13.0–17.0)
MCH: 29.1 pg (ref 26.0–34.0)
MCHC: 32.1 g/dL (ref 30.0–36.0)
MCV: 90.5 fL (ref 80.0–100.0)
Platelets: 236 K/uL (ref 150–400)
RBC: 3.06 MIL/uL — ABNORMAL LOW (ref 4.22–5.81)
RDW: 14.8 % (ref 11.5–15.5)
WBC: 10.5 K/uL (ref 4.0–10.5)
nRBC: 0.2 % (ref 0.0–0.2)

## 2024-08-05 LAB — GLUCOSE, CAPILLARY
Glucose-Capillary: 364 mg/dL — ABNORMAL HIGH (ref 70–99)
Glucose-Capillary: 401 mg/dL — ABNORMAL HIGH (ref 70–99)
Glucose-Capillary: 527 mg/dL (ref 70–99)

## 2024-08-05 LAB — HEMOGLOBIN A1C
Hgb A1c MFr Bld: 9.1 % — ABNORMAL HIGH (ref 4.8–5.6)
Mean Plasma Glucose: 214.47 mg/dL

## 2024-08-05 LAB — BASIC METABOLIC PANEL WITH GFR
Anion gap: 16 — ABNORMAL HIGH (ref 5–15)
BUN: 74 mg/dL — ABNORMAL HIGH (ref 6–20)
CO2: 28 mmol/L (ref 22–32)
Calcium: 8.5 mg/dL — ABNORMAL LOW (ref 8.9–10.3)
Chloride: 89 mmol/L — ABNORMAL LOW (ref 98–111)
Creatinine, Ser: 9.28 mg/dL — ABNORMAL HIGH (ref 0.61–1.24)
GFR, Estimated: 6 mL/min — ABNORMAL LOW (ref >60)
Glucose, Bld: 435 mg/dL — ABNORMAL HIGH (ref 70–99)
Potassium: 4.7 mmol/L (ref 3.5–5.1)
Sodium: 132 mmol/L — ABNORMAL LOW (ref 135–145)

## 2024-08-05 LAB — HEPATITIS B SURFACE ANTIBODY, QUANTITATIVE: Hep B S AB Quant (Post): 13.2 m[IU]/mL

## 2024-08-05 MED ORDER — FLUTICASONE PROPIONATE 50 MCG/ACT NA SUSP: 1.0000 | Freq: Every evening | NASAL | Status: DC | PRN

## 2024-08-05 MED ORDER — EZETIMIBE 10 MG PO TABS: 10.0000 mg | ORAL_TABLET | Freq: Every evening | ORAL | Status: DC

## 2024-08-05 MED ORDER — ALLOPURINOL 100 MG PO TABS
200.0000 mg | ORAL_TABLET | Freq: Every day | ORAL | Status: DC
  Administered 2024-08-05: 200 mg via ORAL
  Filled 2024-08-05: qty 2

## 2024-08-05 MED ORDER — INSULIN ASPART 100 UNIT/ML IJ SOLN: 0.0000 [IU] | Freq: Every day | INTRAMUSCULAR | Status: DC

## 2024-08-05 MED ORDER — ISOSORBIDE MONONITRATE ER 30 MG PO TB24: 15.0000 mg | ORAL_TABLET | Freq: Every day | ORAL | 0 refills | Status: AC

## 2024-08-05 MED ORDER — INSULIN ASPART 100 UNIT/ML IJ SOLN: 0.0000 [IU] | Freq: Three times a day (TID) | INTRAMUSCULAR | Status: DC

## 2024-08-05 MED ORDER — IPRATROPIUM-ALBUTEROL 0.5-2.5 (3) MG/3ML IN SOLN
RESPIRATORY_TRACT | Status: AC
  Filled 2024-08-05: qty 3

## 2024-08-05 MED ORDER — INSULIN GLARGINE 100 UNIT/ML ~~LOC~~ SOLN
20.0000 [IU] | Freq: Every day | SUBCUTANEOUS | Status: DC
  Administered 2024-08-05: 20 [IU] via SUBCUTANEOUS
  Filled 2024-08-05 (×2): qty 0.2

## 2024-08-05 MED ORDER — IPRATROPIUM-ALBUTEROL 0.5-2.5 (3) MG/3ML IN SOLN
3.0000 mL | RESPIRATORY_TRACT | Status: DC | PRN
  Administered 2024-08-05: 3 mL via RESPIRATORY_TRACT

## 2024-08-05 NOTE — Care Management Important Message (Signed)
 Important Message  Patient Details  Name: Kelly Key MRN: [id number] Date of Birth: [birth year]   Important Message Given:  N/A - LOS <3 / Initial given by admissions     Duwaine LITTIE Ada 08/05/2024, 10:50 AM

## 2024-08-05 NOTE — Progress Notes (Signed)
 PROGRESS NOTE    Kelly Key  FMW:[id number] DOB: Dec 16, 1970 DOA: 08/04/2024 PCP: Luke Agent, MD (Inactive)  Subjective: Pt seen and examined. Had 5 L fluid removed with HD yesterday. Weaned to RA. Discussed with nephrology. Pt stable for DC today. F/u with outpatient HD tomorrow.  Discussed with pt and his wife that pt is drinking too much water/liquids in between his HD sessions. Discussed that foods he eats also contain water and he needs to account for this when he is consuming foods/beverages. Pt drinks at least 60 ounces of water a day plus all the fruits he eats.  CBG >400 during hospital stay.   Hospital Course: CC: SOB HPI: Kelly Key is a 53 y.o. male with medical history significant for hypertension, insulin -dependent diabetes mellitus, PAD, chronic anemia, COPD, and chronic HFpEF presents with shortness of breath.   Patient was seen in the emergency department on 08/01/2024 with shortness of breath, was suspected to have COPD exacerbation, and was discharged home with prednisone .  He then had dialysis the following morning, his condition improved, but he has worsened again overnight.  He reports progressive dyspnea overnight with new orthopnea.  He reports feeling like he did when he had too much fluid previously.  He has not noticed any leg swelling.  He has had a mild cough with no sputum production.  He denies rhinorrhea or sore throat.  He denies any recent chest pain.  He used inhalers at home without any appreciable relief.   ED Course: Upon arrival to the ED, patient is found to be afebrile and saturating 88% on room air with normal HR and elevated BP.  Labs are most notable for glucose 166, BUN 78, WBC 12,400, hemoglobin 9.6, and troponin 109.  Chest x-ray is concerning for worsening vascular congestion and mild pulmonary edema.   Blood culture was collected in the ED and the patient was treated with supplemental oxygen, IV Solu-Medrol , Rocephin , azithromycin, and  DuoNeb.  Significant Events: Admitted 08/04/2024 acute on chronic diastolic CHF   Admission Labs: WBC 12.4, HgB 9.6, plt 228 Na 131, K 4.9, CO2 of 26, BUN 78, Scr 10.6, glu 466 Procalcitonin 0.87  Admission Imaging Studies: CXR Worsening CHF with new right basilar atelectasis.   Significant Labs:   Significant Imaging Studies:   Antibiotic Therapy: Anti-infectives (From admission, onward)    Start     Dose/Rate Route Frequency Ordered Stop   08/04/24 0415  cefTRIAXone  (ROCEPHIN ) 1 g in sodium chloride  0.9 % 100 mL IVPB        1 g 200 mL/hr over 30 Minutes Intravenous  Once 08/04/24 0406 08/04/24 0508   08/04/24 0415  azithromycin (ZITHROMAX) 500 mg in sodium chloride  0.9 % 250 mL IVPB        500 mg 250 mL/hr over 60 Minutes Intravenous  Once 08/04/24 0406 08/04/24 0610       Procedures:   Consultants: nephrology    Assessment and Plan: * Acute respiratory failure with hypoxia (HCC) 08/04/24 will need intra-vascular volume management with dialysis. Nephrology consulted. Continue supplemental O2.   08/05/24 weaned to RA.  Hypervolemia associated with renal insufficiency 08/04/24 will need intra-vascular volume management with dialysis. Nephrology consulted.  08/05/24  Had 5 L fluid removed with HD yesterday. Weaned to RA. Discussed with nephrology. Pt stable for DC today. F/u with outpatient HD tomorrow. discussed with pt and his wife that pt is drinking too much water/liquids in between his HD sessions. Discussed that foods he eats also  contain water and he needs to account for this when he is consuming foods/beverages. Pt drinks at least 60 ounces of water a day plus all the fruits he eats.    ESRD (end stage renal disease) on dialysis (HCC) 08/04/24 will need intra-vascular volume management with dialysis. Nephrology consulted.  08/05/24 continue with outpt HD.   Acute on chronic diastolic CHF (congestive heart failure) (HCC) 08/04/24 will need  intra-vascular volume management with dialysis. Nephrology consulted.  08/05/24 Had 5 L fluid removed with HD yesterday. Weaned to RA. Admission weight 343 lbs. Pt had 5 Liter of fluid removed yesterday. Discharge weight 335 lbs. Continue norvasc , lasix , coreg , clonidine , imdur .  Weight Information (last 2 days) before discharge     Date/Time Weight Weight in lbs BSA (Calculated - sq m) BMI (Calculated) Who   08/04/24 1547 152 kg 335.1 lbs 2.72 sq meters 49.46 KM   08/04/24 0258 155.9 kg 343.7 lbs 2.75 sq meters 50.73 KN         Elevated troponin-resolved as of 08/05/2024 08/04/24 due to demand ischemia from volume overload/acute on chronic diastolic CHF. NOT NSTEMI.  08/05/24 stable.   Super obesity 08/04/24 Body mass index is 50.76 kg/m.    OSA (obstructive sleep apnea) 08/04/24 prn CPAP  08/05/24 stable.   Uncontrolled type 2 diabetes mellitus with hyperglycemia (HCC) 08/04/24 continue SSI. Pt with poor dietary compliance.  08/05/24    DVT prophylaxis:   SQ Heparin    Code Status: Full Code Family Communication: discussed with pt and his wife at bedside Disposition Plan: home Reason for continuing need for hospitalization: stable for DC.  Objective: Vitals:   08/05/24 0009 08/05/24 0458 08/05/24 0700 08/05/24 0810  BP: (!) 145/81 (!) 168/84 (!) 153/73 (!) 180/100  Pulse: 79 82 80 83  Resp: 18 17 18    Temp: 98.1 F (36.7 C) 98 F (36.7 C) 98 F (36.7 C)   TempSrc: Oral  Oral   SpO2: 93% 94% 90% 95%  Weight:      Height:        Intake/Output Summary (Last 24 hours) at 08/05/2024 1305 Last data filed at 08/05/2024 0500 Gross per 24 hour  Intake 540 ml  Output 5000 ml  Net -4460 ml   Filed Weights   08/04/24 0258 08/04/24 1547  Weight: (!) 155.9 kg (!) 152 kg    Examination:  Physical Exam Vitals and nursing note reviewed.  Constitutional:      Appearance: He is obese.  HENT:     Head: Normocephalic and atraumatic.  Eyes:     General:  No scleral icterus. Pulmonary:     Effort: Pulmonary effort is normal. No respiratory distress.  Skin:    General: Skin is warm and dry.     Capillary Refill: Capillary refill takes less than 2 seconds.  Neurological:     Mental Status: He is alert and oriented to person, place, and time.     Data Reviewed: I have personally reviewed following labs and imaging studies  CBC: Recent Labs  Lab 08/01/24 2124 08/04/24 0330 08/05/24 0451  WBC 7.5 12.4* 10.5  NEUTROABS  --  9.5*  --   HGB 9.0* 9.6* 8.9*  HCT 28.1* 29.4* 27.7*  MCV 91.8 91.3 90.5  PLT 214 228 236   Basic Metabolic Panel: Recent Labs  Lab 08/01/24 2124 08/04/24 0330 08/04/24 2129 08/05/24 0451  NA 137 131*  --  132*  K 4.4 4.9  --  4.7  CL 97* 90*  --  89*  CO2 26 26  --  28  GLUCOSE 121* 466* 556* 435*  BUN 72* 78*  --  74*  CREATININE 12.10* 10.60*  --  9.28*  CALCIUM  9.4 9.4  --  8.5*   GFR: Estimated Creatinine Clearance: 13.1 mL/min (A) (by C-G formula based on SCr of 9.28 mg/dL (H)). BNP (last 3 results) Recent Labs    11/30/23 1320 01/08/24 1042 04/01/24 0938  BNP 244.0* 143.0* 92.5   HbA1C: Recent Labs    08/04/24 0330  HGBA1C 9.1*   CBG: Recent Labs  Lab 08/04/24 1613 08/04/24 2020 08/05/24 0010 08/05/24 0330 08/05/24 0746  GLUCAP 356* 529* 527* 401* 364*   Sepsis Labs: Recent Labs  Lab 08/04/24 0330  PROCALCITON 0.87    Recent Results (from the past 240 hours)  Culture, blood (routine x 2)     Status: None (Preliminary result)   Collection Time: 08/04/24  3:30 AM   Specimen: BLOOD  Result Value Ref Range Status   Specimen Description BLOOD RIGHT ANTECUBITAL  Final   Special Requests   Final    BOTTLES DRAWN AEROBIC AND ANAEROBIC Blood Culture adequate volume   Culture   Final    NO GROWTH 1 DAY Performed at [facility], [address] [place], [place] [address]    Report Status PENDING  Incomplete  Culture, blood (routine x 2)     Status: None (Preliminary  result)   Collection Time: 08/04/24 10:52 AM   Specimen: BLOOD  Result Value Ref Range Status   Specimen Description BLOOD BLOOD LEFT ARM  Final   Special Requests   Final    BOTTLES DRAWN AEROBIC ONLY Blood Culture results may not be optimal due to an inadequate volume of blood received in culture bottles   Culture   Final    NO GROWTH < 24 HOURS Performed at [facility], [address] [place], [place] [address]    Report Status PENDING  Incomplete     Radiology Studies: DG Chest 2 View Result Date: 08/04/2024 CLINICAL DATA:  Shortness of breath EXAM: CHEST - 2 VIEW COMPARISON:  08/01/2024 FINDINGS: Cardiac shadow is mildly prominent but accentuated by the frontal technique. Central vascular congestion is noted increased from the prior exam with mild edema. Increasing right basilar atelectasis is noted. IMPRESSION: Worsening CHF with new right basilar atelectasis. Electronically Signed   By: Oneil Devonshire M.D.   On: 08/04/2024 03:36    Scheduled Meds:  allopurinol   200 mg Oral Daily   amLODipine   10 mg Oral Daily   aspirin  EC  81 mg Oral Daily   atorvastatin   80 mg Oral Daily   carvedilol   25 mg Oral BID WC   cloNIDine   0.3 mg Oral TID   ezetimibe   10 mg Oral QPM   furosemide   100 mg Oral Once per day on Tuesday Thursday Saturday   furosemide   80 mg Oral 2 times per day on Sunday Monday Wednesday Friday   gabapentin   100 mg Oral TID   heparin   5,000 Units Subcutaneous Q8H   insulin  aspart  0-15 Units Subcutaneous TID WC   insulin  aspart  0-5 Units Subcutaneous QHS   insulin  glargine  20 Units Subcutaneous Daily   isosorbide  mononitrate  15 mg Oral Q supper   sodium chloride  flush  3 mL Intravenous Q12H   Continuous Infusions:   LOS: 1 day   Time spent: 60 minutes  Camellia Door, DO  [facility]  08/05/2024, 1:05 PM

## 2024-08-05 NOTE — Discharge Summary (Signed)
 [facility] Physician Discharge Summary   Patient name: Kelly Key  Admit date:     08/04/2024  Discharge date: 08/05/2024  Attending Physician: CHARLTON EVALENE RAMAN [[id number]]  Discharge Physician: Camellia Door   PCP: Luke Agent, MD (Inactive)  Admitted From: Home  Disposition:  Home  Recommendations for Outpatient Follow-up:  Follow up with PCP in 1-2 weeks F/U with routine HD on T, TH, Sat  Home Health:No Equipment/Devices: None  Discharge Condition:Stable CODE STATUS:FULL Diet recommendation: Renal/Diabetic Fluid Restriction: 50 ounces per day fluid restriction  Hospital Summary: CC: SOB HPI: Kelly Key is a 53 y.o. male with medical history significant for hypertension, insulin -dependent diabetes mellitus, PAD, chronic anemia, COPD, and chronic HFpEF presents with shortness of breath.   Patient was seen in the emergency department on 08/01/2024 with shortness of breath, was suspected to have COPD exacerbation, and was discharged home with prednisone .  He then had dialysis the following morning, his condition improved, but he has worsened again overnight.  He reports progressive dyspnea overnight with new orthopnea.  He reports feeling like he did when he had too much fluid previously.  He has not noticed any leg swelling.  He has had a mild cough with no sputum production.  He denies rhinorrhea or sore throat.  He denies any recent chest pain.  He used inhalers at home without any appreciable relief.   ED Course: Upon arrival to the ED, patient is found to be afebrile and saturating 88% on room air with normal HR and elevated BP.  Labs are most notable for glucose 166, BUN 78, WBC 12,400, hemoglobin 9.6, and troponin 109.  Chest x-ray is concerning for worsening vascular congestion and mild pulmonary edema.   Blood culture was collected in the ED and the patient was treated with supplemental oxygen, IV Solu-Medrol , Rocephin , azithromycin, and DuoNeb.  Significant  Events: Admitted 08/04/2024 acute on chronic diastolic CHF   Admission Labs: WBC 12.4, HgB 9.6, plt 228 Na 131, K 4.9, CO2 of 26, BUN 78, Scr 10.6, glu 466 Procalcitonin 0.87  Admission Imaging Studies: CXR Worsening CHF with new right basilar atelectasis.   Significant Labs:   Significant Imaging Studies:   Antibiotic Therapy: Anti-infectives (From admission, onward)    Start     Dose/Rate Route Frequency Ordered Stop   08/04/24 0415  cefTRIAXone  (ROCEPHIN ) 1 g in sodium chloride  0.9 % 100 mL IVPB        1 g 200 mL/hr over 30 Minutes Intravenous  Once 08/04/24 0406 08/04/24 0508   08/04/24 0415  azithromycin (ZITHROMAX) 500 mg in sodium chloride  0.9 % 250 mL IVPB        500 mg 250 mL/hr over 60 Minutes Intravenous  Once 08/04/24 0406 08/04/24 0610       Procedures:   Consultants: nephrology   Hospital Course by Problem: * Acute respiratory failure with hypoxia (HCC) 08/04/24 will need intra-vascular volume management with dialysis. Nephrology consulted. Continue supplemental O2.    Hypervolemia associated with renal insufficiency 08/04/24 will need intra-vascular volume management with dialysis. Nephrology consulted.   Elevated troponin 08/04/24 due to demand ischemia from volume overload/acute on chronic diastolic CHF. NOT NSTEMI.   ESRD (end stage renal disease) on dialysis (HCC) 08/04/24 will need intra-vascular volume management with dialysis. Nephrology consulted.   Acute on chronic diastolic CHF (congestive heart failure) (HCC) 08/04/24 will need intra-vascular volume management with dialysis. Nephrology consulted.   Super obesity 08/04/24 Body mass index is 50.76 kg/m.  OSA (obstructive sleep apnea) 08/04/24 prn CPAP   Uncontrolled type 2 diabetes mellitus with hyperglycemia (HCC) 08/04/24 continue SSI. Pt with poor dietary compliance.     Discharge Diagnoses:  Principal Problem:   Acute respiratory failure with hypoxia  (HCC) Active Problems:   Hypervolemia associated with renal insufficiency   Acute on chronic diastolic CHF (congestive heart failure) (HCC)   ESRD (end stage renal disease) on dialysis (HCC)   Elevated troponin   Uncontrolled type 2 diabetes mellitus with hyperglycemia (HCC)   OSA (obstructive sleep apnea)   Super obesity   Discharge Instructions  Discharge Instructions     Call MD for:  difficulty breathing, headache or visual disturbances   Complete by: As directed    Call MD for:  extreme fatigue   Complete by: As directed    Call MD for:  hives   Complete by: As directed    Call MD for:  persistant dizziness or light-headedness   Complete by: As directed    Call MD for:  persistant nausea and vomiting   Complete by: As directed    Call MD for:  redness, tenderness, or signs of infection (pain, swelling, redness, odor or green/yellow discharge around incision site)   Complete by: As directed    Call MD for:  severe uncontrolled pain   Complete by: As directed    Call MD for:  temperature >100.4   Complete by: As directed    Diet renal/carb modified with fluid restriction   Complete by: As directed    50 ounces per day fluid restriction   Discharge instructions   Complete by: As directed    1. Follow up with your primary care provider in 1-2 weeks following discharge from hospital. 2. Follow up with routine dialysis on Tues, Thurs, Sat   Increase activity slowly   Complete by: As directed       Allergies as of 08/05/2024       Reactions   Ozempic  (0.25 Or 0.5 Mg-dose) [semaglutide (0.25 Or 0.5mg -dos)] Shortness Of Breath   Hydralazine  Anxiety, Other (See Comments)   Hallucinations    Lisinopril Swelling        Medication List     TAKE these medications    Accu-Chek Aviva Plus test strip Generic drug: glucose blood Use as instructed TO CHECK BLOOD GLUCOSE THREE TIMES DAILY   acetaminophen  500 MG tablet Commonly known as: TYLENOL  Take 1,000 mg by mouth  every 6 (six) hours as needed for moderate pain or headache.   albuterol  108 (90 Base) MCG/ACT inhaler Commonly known as: VENTOLIN  HFA Inhale 2 puffs into the lungs every 4 (four) hours as needed for wheezing or shortness of breath.   allopurinol  100 MG tablet Commonly known as: ZYLOPRIM  Take 2 tablets (200 mg total) by mouth daily.   amLODipine  10 MG tablet Commonly known as: NORVASC  Take 1 tablet (10 mg total) by mouth daily.   aspirin  EC 81 MG tablet Take 1 tablet (81 mg total) by mouth daily.   atorvastatin  80 MG tablet Commonly known as: LIPITOR  Take 1 tablet (80 mg total) by mouth daily.   B-D ULTRAFINE III SHORT PEN 31G X 8 MM Misc Generic drug: Insulin  Pen Needle 1 each by Does not apply route as directed.   carvedilol  25 MG tablet Commonly known as: COREG  Take 1 tablet (25 mg total) by mouth 2 (two) times daily with a meal.   cloNIDine  0.3 MG tablet Commonly known as: CATAPRES  Take 1 tablet (0.3 mg  total) by mouth 3 (three) times daily.   clopidogrel  75 MG tablet Commonly known as: PLAVIX  Take 75 mg by mouth daily.   ezetimibe  10 MG tablet Commonly known as: ZETIA  Take 10 mg by mouth every evening.   ferrous sulfate  325 (65 FE) MG tablet Take 1 tablet (325 mg total) by mouth 2 (two) times daily with a meal.   fluticasone  50 MCG/ACT nasal spray Commonly known as: FLONASE  Place 1 spray into both nostrils at bedtime as needed for allergies or rhinitis.   furosemide  40 MG tablet Commonly known as: LASIX  Please take 2.5 tablets (100 Mg daily) on Dialysis days, and 2 tablets Twice daily on Non Dialysis days (80 Mg Twice Daily)   gabapentin  100 MG capsule Commonly known as: NEURONTIN  Take 1 capsule (100 mg total) by mouth 3 (three) times daily.   HumaLOG KwikPen 100 UNIT/ML KwikPen Generic drug: insulin  lispro Inject 8-30 Units into the skin 3 (three) times daily. Sliding scale 30 units max TID   Ipratropium-Albuterol  20-100 MCG/ACT Aers  respimat Commonly known as: COMBIVENT  Inhale 1 puff into the lungs every 6 (six) hours as needed for wheezing or shortness of breath.   isosorbide  mononitrate 30 MG 24 hr tablet Commonly known as: IMDUR  Take 0.5 tablets (15 mg total) by mouth daily with supper.   Linzess  145 MCG Caps capsule Generic drug: linaclotide  Take 145 mcg by mouth daily as needed (constipation).   multivitamin Tabs tablet Take 1 tablet by mouth at bedtime.   Tresiba FlexTouch 100 UNIT/ML FlexTouch Pen Generic drug: insulin  degludec Inject 20 Units into the skin at bedtime.   Velphoro  500 MG chewable tablet Generic drug: sucroferric oxyhydroxide Chew 500 mg by mouth 3 (three) times daily.        Allergies  Allergen Reactions   Ozempic  (0.25 Or 0.5 Mg-Dose) [Semaglutide (0.25 Or 0.5mg -Dos)] Shortness Of Breath   Hydralazine  Anxiety and Other (See Comments)    Hallucinations    Lisinopril Swelling    Discharge Exam: Vitals:   08/05/24 0700 08/05/24 0810  BP: (!) 153/73 (!) 180/100  Pulse: 80 83  Resp: 18   Temp: 98 F (36.7 C)   SpO2: 90% 95%    Physical Exam Vitals and nursing note reviewed.  Constitutional:      Appearance: He is obese.  HENT:     Head: Normocephalic and atraumatic.  Eyes:     General: No scleral icterus. Pulmonary:     Effort: Pulmonary effort is normal. No respiratory distress.  Skin:    General: Skin is warm and dry.     Capillary Refill: Capillary refill takes less than 2 seconds.  Neurological:     Mental Status: He is alert and oriented to person, place, and time.     The results of significant diagnostics from this hospitalization (including imaging, microbiology, ancillary and laboratory) are listed below for reference.    Microbiology: Recent Results (from the past 240 hours)  Culture, blood (routine x 2)     Status: None (Preliminary result)   Collection Time: 08/04/24  3:30 AM   Specimen: BLOOD  Result Value Ref Range Status   Specimen  Description BLOOD RIGHT ANTECUBITAL  Final   Special Requests   Final    BOTTLES DRAWN AEROBIC AND ANAEROBIC Blood Culture adequate volume   Culture   Final    NO GROWTH 1 DAY Performed at [facility], [address], [place], [place] [address]    Report Status PENDING  Incomplete  Culture, blood (  routine x 2)     Status: None (Preliminary result)   Collection Time: 08/04/24 10:52 AM   Specimen: BLOOD  Result Value Ref Range Status   Specimen Description BLOOD BLOOD LEFT ARM  Final   Special Requests   Final    BOTTLES DRAWN AEROBIC ONLY Blood Culture results may not be optimal due to an inadequate volume of blood received in culture bottles   Culture   Final    NO GROWTH < 24 HOURS Performed at [facility], [address], [place], [place] [address]    Report Status PENDING  Incomplete     Labs: BNP (last 3 results) Recent Labs    11/30/23 1320 01/08/24 1042 04/01/24 0938  BNP 244.0* 143.0* 92.5   Basic Metabolic Panel: Recent Labs  Lab 08/01/24 2124 08/04/24 0330 08/04/24 2129 08/05/24 0451  NA 137 131*  --  132*  K 4.4 4.9  --  4.7  CL 97* 90*  --  89*  CO2 26 26  --  28  GLUCOSE 121* 466* 556* 435*  BUN 72* 78*  --  74*  CREATININE 12.10* 10.60*  --  9.28*  CALCIUM  9.4 9.4  --  8.5*   CBC: Recent Labs  Lab 08/01/24 2124 08/04/24 0330 08/05/24 0451  WBC 7.5 12.4* 10.5  NEUTROABS  --  9.5*  --   HGB 9.0* 9.6* 8.9*  HCT 28.1* 29.4* 27.7*  MCV 91.8 91.3 90.5  PLT 214 228 236   Cardiac Enzymes:  CBG: Recent Labs  Lab 08/04/24 1613 08/04/24 2020 08/05/24 0010 08/05/24 0330 08/05/24 0746  GLUCAP 356* 529* 527* 401* 364*   Hgb A1c Recent Labs    08/04/24 0330  HGBA1C 9.1*   Sepsis Labs Recent Labs  Lab 08/01/24 2124 08/04/24 0330 08/05/24 0451  PROCALCITON  --  0.87  --   WBC 7.5 12.4* 10.5    Procedures/Studies: DG Chest 2 View Result Date: 08/04/2024 CLINICAL DATA:  Shortness of breath EXAM: CHEST - 2 VIEW COMPARISON:  08/01/2024  FINDINGS: Cardiac shadow is mildly prominent but accentuated by the frontal technique. Central vascular congestion is noted increased from the prior exam with mild edema. Increasing right basilar atelectasis is noted. IMPRESSION: Worsening CHF with new right basilar atelectasis. Electronically Signed   By: Oneil Devonshire M.D.   On: 08/04/2024 03:36   DG Chest 2 View Result Date: 08/01/2024 CLINICAL DATA:  Shortness of breath for several days, initial encounter EXAM: CHEST - 2 VIEW COMPARISON:  10/27/2023 FINDINGS: Cardiac shadow is enlarged. Mild vascular congestion is noted although improved when compared with the prior exam. No peripheral edema is seen. No focal infiltrate is noted. IMPRESSION: Mild central vascular congestion without edema. Electronically Signed   By: Oneil Devonshire M.D.   On: 08/01/2024 21:05   DG Knee Complete 4 Views Left Result Date: 07/06/2024 CLINICAL DATA:  Left knee pain after fall 4 days ago. EXAM: LEFT KNEE - COMPLETE 4+ VIEW COMPARISON:  None Available. FINDINGS: No evidence of fracture, dislocation, or joint effusion. Minimal osteophyte formation is noted medially. No significant joint space narrowing is noted. Soft tissues are unremarkable. IMPRESSION: Minimal degenerative joint disease is noted medially. No acute abnormality seen. Electronically Signed   By: Lynwood Landy Raddle M.D.   On: 07/06/2024 13:42    Time coordinating discharge: 60 mins  SIGNED:  Camellia Door, DO [facility] 08/05/24, 10:25 AM

## 2024-08-05 NOTE — Progress Notes (Signed)
 08/04/2024 2020 [facility]G 529  Jesus NP notified Order placed for stat random glucose Result: 556  New order for 10 units subcutaneous ordered and given.  08/05/2024 0330 CBG 401 Jesus NP notified: No new orders  CN [place] Notified

## 2024-08-05 NOTE — Progress Notes (Signed)
 Went over discharge instructions w/ pt and wife. Answered pt wife questions about medications. Pt verbalized understanding.

## 2024-08-05 NOTE — Progress Notes (Addendum)
 D/c orders noted. Contacted out-pt HD clinic, [facility], to inform of pt d/c and anticipated arrival back on Saturday. Last nephrology note faxed over at this time. Will fax d/c summary once available.   [facility] [phone]  Addendum 1:32 D/c summ faxed over. No further support needed

## 2024-08-05 NOTE — Plan of Care (Signed)
  Problem: Education: Goal: Ability to describe self-care measures that may prevent or decrease complications (Diabetes Survival Skills Education) will improve Outcome: Not Applicable Goal: Individualized Educational Video(s) Outcome: Not Applicable   Problem: Coping: Goal: Ability to adjust to condition or change in health will improve Outcome: Not Applicable   Problem: Fluid Volume: Goal: Ability to maintain a balanced intake and output will improve Outcome: Not Applicable   Problem: Health Behavior/Discharge Planning: Goal: Ability to identify and utilize available resources and services will improve Outcome: Not Applicable Goal: Ability to manage health-related needs will improve Outcome: Not Applicable   Problem: Metabolic: Goal: Ability to maintain appropriate glucose levels will improve Outcome: Not Applicable   Problem: Nutritional: Goal: Maintenance of adequate nutrition will improve Outcome: Not Applicable Goal: Progress toward achieving an optimal weight will improve Outcome: Not Applicable   Problem: Skin Integrity: Goal: Risk for impaired skin integrity will decrease Outcome: Not Applicable   Problem: Tissue Perfusion: Goal: Adequacy of tissue perfusion will improve Outcome: Not Applicable   Problem: Education: Goal: Knowledge of General Education information will improve Description: Including pain rating scale, medication(s)/side effects and non-pharmacologic comfort measures Outcome: Not Applicable   Problem: Health Behavior/Discharge Planning: Goal: Ability to manage health-related needs will improve Outcome: Not Applicable   Problem: Clinical Measurements: Goal: Ability to maintain clinical measurements within normal limits will improve Outcome: Not Applicable Goal: Will remain free from infection Outcome: Not Applicable Goal: Diagnostic test results will improve Outcome: Not Applicable Goal: Respiratory complications will improve Outcome: Not  Applicable Goal: Cardiovascular complication will be avoided Outcome: Not Applicable   Problem: Activity: Goal: Risk for activity intolerance will decrease Outcome: Not Applicable   Problem: Nutrition: Goal: Adequate nutrition will be maintained Outcome: Not Applicable   Problem: Coping: Goal: Level of anxiety will decrease Outcome: Not Applicable   Problem: Elimination: Goal: Will not experience complications related to bowel motility Outcome: Not Applicable Goal: Will not experience complications related to urinary retention Outcome: Not Applicable   Problem: Pain Managment: Goal: General experience of comfort will improve and/or be controlled Outcome: Not Applicable   Problem: Safety: Goal: Ability to remain free from injury will improve Outcome: Not Applicable   Problem: Skin Integrity: Goal: Risk for impaired skin integrity will decrease Outcome: Not Applicable

## 2024-08-05 NOTE — Progress Notes (Addendum)
 Admit: 08/04/2024 LOS: 1  62M ESRD THS [facility] LUE AVF with AHRF, acute dyspnea, hypertensive emergency   Subjective:  HD yesterday with 5 L ultrafiltration Weaned down to room air Blood pressures remain elevated Feels much better Fianc bringing in a [place] breakfast  10/09 0701 - 10/10 0700 In: 540 [P.O.:540] Out: 5000   Filed Weights   08/04/24 0258 08/04/24 1547  Weight: (!) 155.9 kg (!) 152 kg    Scheduled Meds:  allopurinol   200 mg Oral Daily   amLODipine   10 mg Oral Daily   aspirin  EC  81 mg Oral Daily   atorvastatin   80 mg Oral Daily   carvedilol   25 mg Oral BID WC   cloNIDine   0.3 mg Oral TID   ezetimibe   10 mg Oral QPM   furosemide   100 mg Oral Once per day on Tuesday Thursday Saturday   furosemide   80 mg Oral 2 times per day on Sunday Monday Wednesday Friday   gabapentin   100 mg Oral TID   heparin   5,000 Units Subcutaneous Q8H   insulin  aspart  0-15 Units Subcutaneous TID WC   insulin  aspart  0-5 Units Subcutaneous QHS   insulin  glargine  20 Units Subcutaneous Daily   isosorbide  mononitrate  15 mg Oral Q supper   sodium chloride  flush  3 mL Intravenous Q12H   Continuous Infusions: PRN Meds:.acetaminophen  **OR** acetaminophen , fluticasone , ipratropium-albuterol , mouth rinse, prochlorperazine   Current Labs: reviewed  RFP reviewed, K4.7, BUN 74 CBC reviewed, hemoglobin 8.9  Physical Exam:  Blood pressure (!) 180/100, pulse 83, temperature 98 F (36.7 C), temperature source Oral, resp. rate 18, height 5' 9 (1.753 m), weight (!) 152 kg, SpO2 95%. Obese, NAD, comfortable appearing, normal work of breathing Regular, normal S1 and S2 Left upper arm AV fistula with bruit and thrill, some aneurysmal disease, unchanged Lungs much more clear throughout Trace lower extremity edema  A AHRF: Chest x-ray with findings of pulmonary congestion and acute on CHF.  Volume overload likely dominant cause.  Much improved with volume unloading. Acute on Chronic  HFpEF: Improved today ESRD on HD THS [facility]: Currently on schedule  Hypertension with emergency: As per # 1, 2, 3.  Continue outpatient blood pressure medications, ongoing volume unloading with this can be done as an outpatient. Anemia of CKD: Hemoglobin stable, no active issue; outpatient management Leukocytosis: Likely related to corticosteroids, slightly improved today Mild hyponatremia: Free water excess with hypervolemia: Stable  CKD-BMD: No current issues, outpatient management  P I think okay for dialysis tomorrow as an outpatient if he does well over the course of the morning. Will write for dialysis orders tomorrow in case remains admitted Needs ongoing dietary sodium restriction, discussed with patient and fianc at length Medication Issues; Preferred narcotic agents for pain control are hydromorphone , fentanyl , and methadone. Morphine  should not be used.  Baclofen should be avoided Avoid oral sodium phosphate  and magnesium  citrate based laxatives / bowel preps   Will not physically see the patient over the weekend.  Dialysis orders written should patient remain in the hospital.  Please reach out to on-call nephrology for any concerns.  Bernardino KATHEE Gasman 08/05/2024, 9:55 AM  Recent Labs  Lab 08/01/24 2124 08/04/24 0330 08/04/24 2129 08/05/24 0451  NA 137 131*  --  132*  K 4.4 4.9  --  4.7  CL 97* 90*  --  89*  CO2 26 26  --  28  GLUCOSE 121* 466* 556* 435*  BUN 72* 78*  --  74*  CREATININE 12.10* 10.60*  --  9.28*  CALCIUM  9.4 9.4  --  8.5*   Recent Labs  Lab 08/01/24 2124 08/04/24 0330 08/05/24 0451  WBC 7.5 12.4* 10.5  NEUTROABS  --  9.5*  --   HGB 9.0* 9.6* 8.9*  HCT 28.1* 29.4* 27.7*  MCV 91.8 91.3 90.5  PLT 214 228 236

## 2024-08-06 ENCOUNTER — Inpatient Hospital Stay (HOSPITAL_COMMUNITY)
Admission: EM | Admit: 2024-08-06 | Discharge: 2024-08-08 | DRG: 193 | Disposition: A | Discharge status: Home / Self Care | Source: Ambulatory Visit | Attending: Internal Medicine | Admitting: Internal Medicine

## 2024-08-06 ENCOUNTER — Other Ambulatory Visit: Payer: Self-pay

## 2024-08-06 ENCOUNTER — Encounter (HOSPITAL_COMMUNITY): Payer: Self-pay | Admitting: Emergency Medicine

## 2024-08-06 ENCOUNTER — Emergency Department (HOSPITAL_COMMUNITY)

## 2024-08-06 DIAGNOSIS — E11649 Type 2 diabetes mellitus with hypoglycemia without coma: Secondary | ICD-10-CM | POA: Diagnosis not present

## 2024-08-06 DIAGNOSIS — Z992 Dependence on renal dialysis: Secondary | ICD-10-CM | POA: Diagnosis not present

## 2024-08-06 DIAGNOSIS — E1165 Type 2 diabetes mellitus with hyperglycemia: Secondary | ICD-10-CM | POA: Diagnosis present

## 2024-08-06 DIAGNOSIS — Z7902 Long term (current) use of antithrombotics/antiplatelets: Secondary | ICD-10-CM | POA: Diagnosis not present

## 2024-08-06 DIAGNOSIS — I5033 Acute on chronic diastolic (congestive) heart failure: Secondary | ICD-10-CM | POA: Diagnosis present

## 2024-08-06 DIAGNOSIS — Z825 Family history of asthma and other chronic lower respiratory diseases: Secondary | ICD-10-CM

## 2024-08-06 DIAGNOSIS — G4733 Obstructive sleep apnea (adult) (pediatric): Secondary | ICD-10-CM | POA: Diagnosis present

## 2024-08-06 DIAGNOSIS — Z8249 Family history of ischemic heart disease and other diseases of the circulatory system: Secondary | ICD-10-CM

## 2024-08-06 DIAGNOSIS — M109 Gout, unspecified: Secondary | ICD-10-CM | POA: Diagnosis present

## 2024-08-06 DIAGNOSIS — Z89422 Acquired absence of other left toe(s): Secondary | ICD-10-CM

## 2024-08-06 DIAGNOSIS — K219 Gastro-esophageal reflux disease without esophagitis: Secondary | ICD-10-CM | POA: Diagnosis present

## 2024-08-06 DIAGNOSIS — E662 Morbid (severe) obesity with alveolar hypoventilation: Secondary | ICD-10-CM | POA: Diagnosis present

## 2024-08-06 DIAGNOSIS — I10 Essential (primary) hypertension: Secondary | ICD-10-CM

## 2024-08-06 DIAGNOSIS — Z794 Long term (current) use of insulin: Secondary | ICD-10-CM

## 2024-08-06 DIAGNOSIS — E785 Hyperlipidemia, unspecified: Secondary | ICD-10-CM | POA: Diagnosis present

## 2024-08-06 DIAGNOSIS — J189 Pneumonia, unspecified organism: Principal | ICD-10-CM | POA: Diagnosis present

## 2024-08-06 DIAGNOSIS — D631 Anemia in chronic kidney disease: Secondary | ICD-10-CM | POA: Diagnosis present

## 2024-08-06 DIAGNOSIS — I252 Old myocardial infarction: Secondary | ICD-10-CM

## 2024-08-06 DIAGNOSIS — J9601 Acute respiratory failure with hypoxia: Secondary | ICD-10-CM | POA: Diagnosis present

## 2024-08-06 DIAGNOSIS — E871 Hypo-osmolality and hyponatremia: Secondary | ICD-10-CM | POA: Diagnosis present

## 2024-08-06 DIAGNOSIS — K760 Fatty (change of) liver, not elsewhere classified: Secondary | ICD-10-CM | POA: Diagnosis present

## 2024-08-06 DIAGNOSIS — J9600 Acute respiratory failure, unspecified whether with hypoxia or hypercapnia: Secondary | ICD-10-CM | POA: Insufficient documentation

## 2024-08-06 DIAGNOSIS — N186 End stage renal disease: Secondary | ICD-10-CM | POA: Diagnosis present

## 2024-08-06 DIAGNOSIS — I132 Hypertensive heart and chronic kidney disease with heart failure and with stage 5 chronic kidney disease, or end stage renal disease: Secondary | ICD-10-CM | POA: Diagnosis present

## 2024-08-06 DIAGNOSIS — E1122 Type 2 diabetes mellitus with diabetic chronic kidney disease: Secondary | ICD-10-CM | POA: Diagnosis present

## 2024-08-06 DIAGNOSIS — Z79899 Other long term (current) drug therapy: Secondary | ICD-10-CM

## 2024-08-06 DIAGNOSIS — E1151 Type 2 diabetes mellitus with diabetic peripheral angiopathy without gangrene: Secondary | ICD-10-CM | POA: Diagnosis present

## 2024-08-06 DIAGNOSIS — Z6841 Body Mass Index (BMI) 40.0 and over, adult: Secondary | ICD-10-CM | POA: Diagnosis not present

## 2024-08-06 DIAGNOSIS — Z713 Dietary counseling and surveillance: Secondary | ICD-10-CM

## 2024-08-06 DIAGNOSIS — Z833 Family history of diabetes mellitus: Secondary | ICD-10-CM

## 2024-08-06 DIAGNOSIS — J440 Chronic obstructive pulmonary disease with acute lower respiratory infection: Secondary | ICD-10-CM | POA: Diagnosis present

## 2024-08-06 DIAGNOSIS — J441 Chronic obstructive pulmonary disease with (acute) exacerbation: Secondary | ICD-10-CM | POA: Diagnosis present

## 2024-08-06 DIAGNOSIS — Z888 Allergy status to other drugs, medicaments and biological substances status: Secondary | ICD-10-CM

## 2024-08-06 DIAGNOSIS — Z7982 Long term (current) use of aspirin: Secondary | ICD-10-CM

## 2024-08-06 LAB — CBC WITH DIFFERENTIAL/PLATELET
Abs Immature Granulocytes: 0.07 K/uL (ref 0.00–0.07)
Basophils Absolute: 0 K/uL (ref 0.0–0.1)
Basophils Relative: 0 %
Eosinophils Absolute: 0.1 K/uL (ref 0.0–0.5)
Eosinophils Relative: 1 %
HCT: 29.7 % — ABNORMAL LOW (ref 39.0–52.0)
Hemoglobin: 9.6 g/dL — ABNORMAL LOW (ref 13.0–17.0)
Immature Granulocytes: 1 %
Lymphocytes Relative: 15 %
Lymphs Abs: 1.8 K/uL (ref 0.7–4.0)
MCH: 28.7 pg (ref 26.0–34.0)
MCHC: 32.3 g/dL (ref 30.0–36.0)
MCV: 88.9 fL (ref 80.0–100.0)
Monocytes Absolute: 1.1 K/uL — ABNORMAL HIGH (ref 0.1–1.0)
Monocytes Relative: 9 %
Neutro Abs: 8.5 K/uL — ABNORMAL HIGH (ref 1.7–7.7)
Neutrophils Relative %: 74 %
Platelets: 257 K/uL (ref 150–400)
RBC: 3.34 MIL/uL — ABNORMAL LOW (ref 4.22–5.81)
RDW: 14.6 % (ref 11.5–15.5)
WBC: 11.6 K/uL — ABNORMAL HIGH (ref 4.0–10.5)
nRBC: 0 % (ref 0.0–0.2)

## 2024-08-06 LAB — RESP PANEL BY RT-PCR (RSV, FLU A&B, COVID)  RVPGX2
Influenza A by PCR: NEGATIVE
Influenza B by PCR: NEGATIVE
Resp Syncytial Virus by PCR: NEGATIVE
SARS Coronavirus 2 by RT PCR: NEGATIVE

## 2024-08-06 LAB — GLUCOSE, CAPILLARY
Glucose-Capillary: 309 mg/dL — ABNORMAL HIGH (ref 70–99)
Glucose-Capillary: 537 mg/dL (ref 70–99)
Glucose-Capillary: 556 mg/dL (ref 70–99)

## 2024-08-06 LAB — BASIC METABOLIC PANEL WITH GFR
Anion gap: 16 — ABNORMAL HIGH (ref 5–15)
BUN: 94 mg/dL — ABNORMAL HIGH (ref 6–20)
CO2: 26 mmol/L (ref 22–32)
Calcium: 8.7 mg/dL — ABNORMAL LOW (ref 8.9–10.3)
Chloride: 90 mmol/L — ABNORMAL LOW (ref 98–111)
Creatinine, Ser: 10.7 mg/dL — ABNORMAL HIGH (ref 0.61–1.24)
GFR, Estimated: 5 mL/min — ABNORMAL LOW (ref >60)
Glucose, Bld: 232 mg/dL — ABNORMAL HIGH (ref 70–99)
Potassium: 4.3 mmol/L (ref 3.5–5.1)
Sodium: 133 mmol/L — ABNORMAL LOW (ref 135–145)

## 2024-08-06 LAB — HIV ANTIBODY (ROUTINE TESTING W REFLEX): HIV Screen 4th Generation wRfx: NONREACTIVE

## 2024-08-06 LAB — PROCALCITONIN: Procalcitonin: 0.61 ng/mL

## 2024-08-06 MED ORDER — ONDANSETRON HCL 4 MG PO TABS: 4.0000 mg | ORAL_TABLET | Freq: Four times a day (QID) | ORAL | Status: DC | PRN

## 2024-08-06 MED ORDER — ALBUTEROL SULFATE (2.5 MG/3ML) 0.083% IN NEBU
INHALATION_SOLUTION | RESPIRATORY_TRACT | Status: AC
  Filled 2024-08-06: qty 3

## 2024-08-06 MED ORDER — SODIUM CHLORIDE 0.9 % IV SOLN
1.0000 g | INTRAVENOUS | Status: DC
  Administered 2024-08-07 – 2024-08-08 (×2): 1 g via INTRAVENOUS
  Filled 2024-08-06 (×2): qty 10

## 2024-08-06 MED ORDER — METHYLPREDNISOLONE SODIUM SUCC 40 MG IJ SOLR
40.0000 mg | Freq: Two times a day (BID) | INTRAMUSCULAR | Status: DC
  Administered 2024-08-06 – 2024-08-07 (×2): 40 mg via INTRAVENOUS
  Filled 2024-08-06 (×2): qty 1

## 2024-08-06 MED ORDER — ACETAMINOPHEN 325 MG PO TABS
650.0000 mg | ORAL_TABLET | Freq: Four times a day (QID) | ORAL | Status: DC | PRN
  Administered 2024-08-07: 650 mg via ORAL
  Filled 2024-08-06: qty 2

## 2024-08-06 MED ORDER — LIDOCAINE HCL (PF) 1 % IJ SOLN
5.0000 mL | INTRAMUSCULAR | Status: DC | PRN
Start: 2024-08-06 — End: 2024-08-08

## 2024-08-06 MED ORDER — HEPARIN SODIUM (PORCINE) 5000 UNIT/ML IJ SOLN
5000.0000 [IU] | Freq: Three times a day (TID) | INTRAMUSCULAR | Status: DC
  Administered 2024-08-06 – 2024-08-08 (×6): 5000 [IU] via SUBCUTANEOUS
  Filled 2024-08-06 (×6): qty 1

## 2024-08-06 MED ORDER — METHYLPREDNISOLONE SODIUM SUCC 125 MG IJ SOLR
125.0000 mg | Freq: Once | INTRAMUSCULAR | Status: AC
Start: 2024-08-06 — End: 2024-08-06
  Administered 2024-08-06: 125 mg via INTRAVENOUS
  Filled 2024-08-06: qty 2

## 2024-08-06 MED ORDER — SODIUM CHLORIDE 0.9 % IV SOLN
500.0000 mg | Freq: Once | INTRAVENOUS | Status: AC
  Administered 2024-08-06: 500 mg via INTRAVENOUS
  Filled 2024-08-06: qty 5

## 2024-08-06 MED ORDER — NITROGLYCERIN 2 % TD OINT
1.0000 [in_us] | TOPICAL_OINTMENT | Freq: Once | TRANSDERMAL | Status: AC
  Administered 2024-08-06: 1 [in_us] via TOPICAL
  Filled 2024-08-06: qty 1

## 2024-08-06 MED ORDER — ALTEPLASE 2 MG IJ SOLR: 2.0000 mg | Freq: Once | INTRAMUSCULAR | Status: DC | PRN

## 2024-08-06 MED ORDER — ONDANSETRON HCL 4 MG/2ML IJ SOLN
4.0000 mg | Freq: Four times a day (QID) | INTRAMUSCULAR | Status: DC | PRN
Start: 2024-08-06 — End: 2024-08-08

## 2024-08-06 MED ORDER — INSULIN ASPART 100 UNIT/ML IJ SOLN
0.0000 [IU] | Freq: Three times a day (TID) | INTRAMUSCULAR | Status: DC
  Administered 2024-08-06: 4 [IU] via SUBCUTANEOUS
  Administered 2024-08-07: 15 [IU] via SUBCUTANEOUS
  Administered 2024-08-07: 5 [IU] via SUBCUTANEOUS

## 2024-08-06 MED ORDER — HEPARIN SODIUM (PORCINE) 1000 UNIT/ML DIALYSIS
3000.0000 [IU] | INTRAMUSCULAR | Status: DC | PRN
  Administered 2024-08-08: 3000 [IU] via INTRAVENOUS_CENTRAL

## 2024-08-06 MED ORDER — SODIUM CHLORIDE 0.9 % IV SOLN
1.0000 g | Freq: Once | INTRAVENOUS | Status: AC
  Administered 2024-08-06: 1 g via INTRAVENOUS
  Filled 2024-08-06: qty 10

## 2024-08-06 MED ORDER — INSULIN ASPART 100 UNIT/ML IJ SOLN
9.0000 [IU] | Freq: Once | INTRAMUSCULAR | Status: AC
  Administered 2024-08-06: 9 [IU] via SUBCUTANEOUS

## 2024-08-06 MED ORDER — HEPARIN SODIUM (PORCINE) 1000 UNIT/ML DIALYSIS
1000.0000 [IU] | INTRAMUSCULAR | Status: DC | PRN
Start: 2024-08-06 — End: 2024-08-08

## 2024-08-06 MED ORDER — CHLORHEXIDINE GLUCONATE CLOTH 2 % EX PADS
6.0000 | MEDICATED_PAD | Freq: Every day | CUTANEOUS | Status: DC
  Administered 2024-08-07 – 2024-08-08 (×2): 6 via TOPICAL

## 2024-08-06 MED ORDER — PENTAFLUOROPROP-TETRAFLUOROETH EX AERO
1.0000 | INHALATION_SPRAY | CUTANEOUS | Status: DC | PRN
  Administered 2024-08-08: 1 via TOPICAL
  Filled 2024-08-06: qty 30

## 2024-08-06 MED ORDER — INSULIN GLARGINE 100 UNIT/ML ~~LOC~~ SOLN
10.0000 [IU] | Freq: Every day | SUBCUTANEOUS | Status: DC
  Administered 2024-08-06: 10 [IU] via SUBCUTANEOUS
  Filled 2024-08-06 (×2): qty 0.1

## 2024-08-06 MED ORDER — INSULIN ASPART 100 UNIT/ML IJ SOLN
11.0000 [IU] | Freq: Once | INTRAMUSCULAR | Status: AC
Start: 2024-08-07 — End: 2024-08-06
  Administered 2024-08-06: 11 [IU] via SUBCUTANEOUS

## 2024-08-06 MED ORDER — LABETALOL HCL 5 MG/ML IV SOLN
5.0000 mg | Freq: Four times a day (QID) | INTRAVENOUS | Status: DC | PRN
  Administered 2024-08-07: 5 mg via INTRAVENOUS
  Filled 2024-08-06: qty 4

## 2024-08-06 MED ORDER — ANTICOAGULANT SODIUM CITRATE 4% (200MG/5ML) IV SOLN: 5.0000 mL | Status: DC | PRN

## 2024-08-06 MED ORDER — DM-GUAIFENESIN ER 30-600 MG PO TB12
1.0000 | ORAL_TABLET | Freq: Two times a day (BID) | ORAL | Status: DC
  Administered 2024-08-06 – 2024-08-08 (×5): 1 via ORAL
  Filled 2024-08-06 (×5): qty 1

## 2024-08-06 MED ORDER — IPRATROPIUM-ALBUTEROL 0.5-2.5 (3) MG/3ML IN SOLN
3.0000 mL | Freq: Once | RESPIRATORY_TRACT | Status: AC
  Administered 2024-08-06: 3 mL via RESPIRATORY_TRACT
  Filled 2024-08-06: qty 3

## 2024-08-06 MED ORDER — IPRATROPIUM-ALBUTEROL 0.5-2.5 (3) MG/3ML IN SOLN
3.0000 mL | Freq: Four times a day (QID) | RESPIRATORY_TRACT | Status: DC
  Administered 2024-08-06 – 2024-08-08 (×7): 3 mL via RESPIRATORY_TRACT
  Filled 2024-08-06 (×9): qty 3

## 2024-08-06 MED ORDER — HEPARIN SODIUM (PORCINE) 1000 UNIT/ML DIALYSIS
20.0000 [IU]/kg | INTRAMUSCULAR | Status: DC | PRN
  Administered 2024-08-06: 3000 [IU] via INTRAVENOUS_CENTRAL

## 2024-08-06 MED ORDER — ACETAMINOPHEN 650 MG RE SUPP: 650.0000 mg | Freq: Four times a day (QID) | RECTAL | Status: DC | PRN

## 2024-08-06 MED ORDER — INSULIN ASPART 100 UNIT/ML IJ SOLN
0.0000 [IU] | Freq: Every day | INTRAMUSCULAR | Status: DC
  Administered 2024-08-07: 5 [IU] via SUBCUTANEOUS

## 2024-08-06 MED ORDER — LIDOCAINE-PRILOCAINE 2.5-2.5 % EX CREA: 1.0000 | TOPICAL_CREAM | CUTANEOUS | Status: DC | PRN

## 2024-08-06 MED ORDER — SODIUM CHLORIDE 0.9 % IV SOLN
500.0000 mg | INTRAVENOUS | Status: DC
  Administered 2024-08-07 – 2024-08-08 (×2): 500 mg via INTRAVENOUS
  Filled 2024-08-06 (×2): qty 5

## 2024-08-06 MED ORDER — SODIUM CHLORIDE 0.9 % IV SOLN: 500.0000 mg | INTRAVENOUS | Status: DC

## 2024-08-06 MED ORDER — PANTOPRAZOLE SODIUM 40 MG PO TBEC
40.0000 mg | DELAYED_RELEASE_TABLET | Freq: Every day | ORAL | Status: DC
  Administered 2024-08-06 – 2024-08-08 (×3): 40 mg via ORAL
  Filled 2024-08-06 (×3): qty 1

## 2024-08-06 NOTE — H&P (Signed)
 History and Physical    Patient: Kelly Key FMW:[id number] DOB: 1971/10/24 DOA: 08/06/2024 DOS: the patient was seen and examined on 08/06/2024 PCP: Luke Agent, MD (Inactive)  Patient coming from: Home  Chief Complaint:  Chief Complaint  Patient presents with   Shortness of Breath   Cough   HPI: Kelly Key is a 53 y.o. male with medical history significant of hypertension, insulin -dependent diabetes mellitus, PAD, chronic anemia, COPD, and chronic HFpEF presents to the emergency department due to increasing shortness of breath. Patient was admitted on 10/9 and discharged yesterday (10/10) due to acute respiratory failure with hypoxia and hypervolemia associated with renal insufficiency.  He was discharged home after dialysis.  At home, patient complained of chest congestion, cough, increased shortness of breath and chills.  Wife is concerned that he may have flu.  Patient continued to complain of chest tightness and shortness of breath.  He denies nausea, vomiting, abdominal pain.  ED Course:  In the emergency department, BP was elevated at 220/103, pulse 103 bpm, respiratory rate 23/min, temperature 98.36F, O2 sat 94% on supplemental oxygen at 3 LPM (patient does not use oxygen at baseline).  Workup in the ED showed normocytic anemia.  BMP showed sodium 132, potassium 4.7, chloride 89, bicarb 28, blood glucose 435, BUN/creatinine 74/9.28, calcium  8.5, eGFR 6, anion gap 16, hemoglobin A1c 9.1.  Influenza A, B, SARS coronavirus 2, RSV was negative, blood culture pending Chest x-ray showed  Right basilar airspace opacities, suspicious for pneumonia or aspiration.Mild pulmonary edema. He was treated with ceftriaxone  and azithromycin, breathing treatment was provided, Solu-Medrol  125 mg IV was given and nitroglycerin  patch due to elevated BP was provided. Nephrologist (Dr. Gearline) was consulted and will arrange dialysis for patient  Review of Systems: Review of systems as noted in the HPI. All  other systems reviewed and are negative.   Past Medical History:  Diagnosis Date   Acute diastolic CHF (congestive heart failure) (HCC) 06/11/2012   EF 50-55% [facility])   Acute gout of right knee 08/03/2019   Chronic kidney disease    COPD (chronic obstructive pulmonary disease) (HCC)    Diabetes mellitus    A1c 11.5 (06/11/2012).   Dyspnea    Gout    Hepatic steatosis 06/11/2012   Elevated LFTs   Hyperlipemia    Malignant hypertension    Microcytic anemia 06/12/2012   NSTEMI (non-ST elevated myocardial infarction) (HCC) 05/17/2021   Obesity    Thromboembolism (HCC) 02/20/2019   Past Surgical History:  Procedure Laterality Date   ABDOMINAL AORTOGRAM W/LOWER EXTREMITY N/A 12/01/2022   Procedure: ABDOMINAL AORTOGRAM W/LOWER EXTREMITY;  Surgeon: Gretta Lonni PARAS, MD;  Location: [facility] INVASIVE CV LAB;  Service: Cardiovascular;  Laterality: N/A;   AMPUTATION Left 12/03/2022   Procedure: LEFT SECOND TOE AMPUTATION;  Surgeon: Harden Jerona GAILS, MD;  Location: [facility] OR;  Service: Orthopedics;  Laterality: Left;   AV FISTULA PLACEMENT Left 11/11/2019   Procedure: ARTERIOVENOUS (AV) FISTULA CREATION LEFT BRACHIOCEPHALIC ARM;  Surgeon: Serene Gaile ORN, MD;  Location: [facility] OR;  Service: Vascular;  Laterality: Left;   COLONOSCOPY WITH PROPOFOL  N/A 09/02/2021   Procedure: COLONOSCOPY WITH PROPOFOL ;  Surgeon: Therisa Bi, MD;  Location: [facility] ENDOSCOPY;  Service: Gastroenterology;  Laterality: N/A;   IR FLUORO GUIDE CV LINE RIGHT  07/08/2019   IR REMOVAL TUN CV CATH W/O FL  03/23/2020   IR US  GUIDE VASC ACCESS RIGHT  07/08/2019   LEFT HEART CATH AND CORONARY ANGIOGRAPHY N/A 06/12/2021   Procedure: LEFT  HEART CATH AND CORONARY ANGIOGRAPHY;  Surgeon: Claudene Victory ORN, MD;  Location: [facility] INVASIVE CV LAB;  Service: Cardiovascular;  Laterality: N/A;   NO PAST SURGERIES     PERIPHERAL VASCULAR BALLOON ANGIOPLASTY Left 12/01/2022   Procedure: PERIPHERAL VASCULAR BALLOON ANGIOPLASTY;  Surgeon: Gretta Lonni PARAS, MD;  Location: [facility] INVASIVE CV LAB;  Service: Cardiovascular;  Laterality: Left;  Anterior tibial   PERIPHERAL VASCULAR INTERVENTION Left 12/01/2022   Procedure: PERIPHERAL VASCULAR INTERVENTION;  Surgeon: Gretta Lonni PARAS, MD;  Location: [facility] INVASIVE CV LAB;  Service: Cardiovascular;  Laterality: Left;  sfa   VASCULAR SURGERY      Social History:  reports that he has never smoked. He has been exposed to tobacco smoke. He has never used smokeless tobacco. He reports that he does not currently use alcohol. He reports that he does not use drugs.   Allergies  Allergen Reactions   Ozempic  (0.25 Or 0.5 Mg-Dose) [Semaglutide (0.25 Or 0.5mg -Dos)] Shortness Of Breath   Hydralazine  Anxiety and Other (See Comments)    Hallucinations    Lisinopril Swelling    Family History  Problem Relation Age of Onset   Gout Mother    Asthma Mother    Diabetes Father    Heart failure Father    Diabetes Sister    Hypertension Brother    Pancreatic cancer Brother    Diabetes Sister      Prior to Admission medications   Medication Sig Start Date End Date Taking? Authorizing Provider  acetaminophen  (TYLENOL ) 500 MG tablet Take 1,000 mg by mouth every 6 (six) hours as needed for moderate pain or headache.    [provider]  albuterol  (VENTOLIN  HFA) 108 (90 Base) MCG/ACT inhaler Inhale 2 puffs into the lungs every 4 (four) hours as needed for wheezing or shortness of breath. 08/01/24   Yolande Lamar BROCKS, MD  allopurinol  (ZYLOPRIM ) 100 MG tablet Take 2 tablets (200 mg total) by mouth daily. 08/04/19   Love, Sharlet RAMAN, PA-C  amLODipine  (NORVASC ) 10 MG tablet Take 1 tablet (10 mg total) by mouth daily. 04/04/24   Miriam Norris, NP  aspirin  EC 81 MG tablet Take 1 tablet (81 mg total) by mouth daily. 05/06/23   Pearlean Manus, MD  atorvastatin  (LIPITOR ) 80 MG tablet Take 1 tablet (80 mg total) by mouth daily. 06/13/24   Miriam Norris, NP  carvedilol  (COREG ) 25 MG tablet Take 1 tablet (25 mg total) by  mouth 2 (two) times daily with a meal. 05/06/23   Emokpae, Courage, MD  cloNIDine  (CATAPRES ) 0.3 MG tablet Take 1 tablet (0.3 mg total) by mouth 3 (three) times daily. 05/06/23   Pearlean Manus, MD  clopidogrel  (PLAVIX ) 75 MG tablet Take 75 mg by mouth daily. 08/26/23   [provider]  ezetimibe  (ZETIA ) 10 MG tablet Take 10 mg by mouth every evening. 01/20/19   [provider]  ferrous sulfate  325 (65 FE) MG tablet Take 1 tablet (325 mg total) by mouth 2 (two) times daily with a meal. 09/30/23 08/04/24  Von Bellis, MD  fluticasone  (FLONASE ) 50 MCG/ACT nasal spray Place 1 spray into both nostrils at bedtime as needed for allergies or rhinitis.    [provider]  furosemide  (LASIX ) 40 MG tablet Please take 2.5 tablets (100 Mg daily) on Dialysis days, and 2 tablets Twice daily on Non Dialysis days (80 Mg Twice Daily) 04/04/24   Miriam Norris, NP  gabapentin  (NEURONTIN ) 100 MG capsule Take 1 capsule (100 mg total) by mouth 3 (three) times  daily. 08/03/19   Love, Sharlet RAMAN, PA-C  glucose blood (ACCU-CHEK AVIVA PLUS) test strip Use as instructed TO CHECK BLOOD GLUCOSE THREE TIMES DAILY 08/15/20   Nida, Gebreselassie W, MD  HUMALOG KWIKPEN 100 UNIT/ML KwikPen Inject 8-30 Units into the skin 3 (three) times daily. Sliding scale 30 units max TID 08/19/22   [provider]  Insulin  Pen Needle (B-D ULTRAFINE III SHORT PEN) 31G X 8 MM MISC 1 each by Does not apply route as directed. 08/13/20   Nida, Gebreselassie W, MD  Ipratropium-Albuterol  (COMBIVENT ) 20-100 MCG/ACT AERS respimat Inhale 1 puff into the lungs every 6 (six) hours as needed for wheezing or shortness of breath. 09/30/23 08/04/24  Von Bellis, MD  isosorbide  mononitrate (IMDUR ) 30 MG 24 hr tablet Take 0.5 tablets (15 mg total) by mouth daily with supper. 08/05/24 11/03/24  Laurence Locus, DO  linaclotide  (LINZESS ) 145 MCG CAPS capsule Take 145 mcg by mouth daily as needed (constipation).    [provider]   multivitamin (RENA-VIT) TABS tablet Take 1 tablet by mouth at bedtime. 06/16/24   [provider]  TRESIBA FLEXTOUCH 100 UNIT/ML FlexTouch Pen Inject 20 Units into the skin at bedtime. 06/29/24   [provider]  VELPHORO  500 MG chewable tablet Chew 500 mg by mouth 3 (three) times daily. 08/13/23   [provider]    Physical Exam: BP (!) 162/87   Pulse 96   Temp 98.7 F (37.1 C) (Oral)   Resp 18   Ht 5' 9 (1.753 m)   Wt (!) 154.2 kg   SpO2 95%   BMI 50.21 kg/m   General: 53 y.o. year-old male ill appearing, but in no acute distress.  Alert and oriented x3. HEENT: NCAT, EOMI Neck: Supple, trachea medial Cardiovascular: Regular rate and rhythm with no rubs or gallops.  No thyromegaly or JVD noted.  No lower extremity edema. 2/4 pulses in all 4 extremities. Respiratory: Tachypnea, mild scattered wheezes with bilateral Rales in lower lobes.   Abdomen: Soft, nontender nondistended with normal bowel sounds x4 quadrants. Muskuloskeletal: No cyanosis, clubbing or edema noted bilaterally Neuro: CN II-XII intact, strength 5/5 x 4, sensation, reflexes intact Skin: No ulcerative lesions noted or rashes Psychiatry: Judgement and insight appear normal. Mood is appropriate for condition and setting          Labs on Admission:  Basic Metabolic Panel: Recent Labs  Lab 08/01/24 2124 08/04/24 0330 08/04/24 2129 08/05/24 0451 08/06/24 0500  NA 137 131*  --  132* 133*  K 4.4 4.9  --  4.7 4.3  CL 97* 90*  --  89* 90*  CO2 26 26  --  28 26  GLUCOSE 121* 466* 556* 435* 232*  BUN 72* 78*  --  74* 94*  CREATININE 12.10* 10.60*  --  9.28* 10.70*  CALCIUM  9.4 9.4  --  8.5* 8.7*   Liver Function Tests: No results for input(s): AST, ALT, ALKPHOS, BILITOT, PROT, ALBUMIN  in the last 168 hours. No results for input(s): LIPASE, AMYLASE in the last 168 hours. No results for input(s): AMMONIA in the last 168 hours. CBC: Recent Labs  Lab 08/01/24 2124  08/04/24 0330 08/05/24 0451 08/06/24 0500  WBC 7.5 12.4* 10.5 11.6*  NEUTROABS  --  9.5*  --  8.5*  HGB 9.0* 9.6* 8.9* 9.6*  HCT 28.1* 29.4* 27.7* 29.7*  MCV 91.8 91.3 90.5 88.9  PLT 214 228 236 257   Cardiac Enzymes: No results for input(s): CKTOTAL, CKMB, CKMBINDEX, TROPONINI in  the last 168 hours.  BNP (last 3 results) Recent Labs    11/30/23 1320 01/08/24 1042 04/01/24 0938  BNP 244.0* 143.0* 92.5    ProBNP (last 3 results) No results for input(s): PROBNP in the last 8760 hours.  CBG: Recent Labs  Lab 08/04/24 1613 08/04/24 2020 08/05/24 0010 08/05/24 0330 08/05/24 0746  GLUCAP 356* 529* 527* 401* 364*    Radiological Exams on Admission: DG Chest Portable 1 View Result Date: 08/06/2024 EXAM: 1 VIEW(S) XRAY OF THE CHEST 08/06/2024 05:10:07 AM COMPARISON: 08/04/2024 CLINICAL HISTORY: sob. Pt with c/o SOB, cough, and congestion since being discharged yesterday. Pt scheduled for HD this am but states was feeling too poorly. Pt's O2 sats on R/A were 85%. Placed pt on O2 @ 4L [place] and sats up to 95%. EDP @ bedside. FINDINGS: LUNGS AND PLEURA: Right basilar airspace opacities. Mild pulmonary edema. No pleural effusion. No pneumothorax. HEART AND MEDIASTINUM: No acute abnormality of the cardiac and mediastinal silhouettes. BONES AND SOFT TISSUES: No acute osseous abnormality. IMPRESSION: 1. Right basilar airspace opacities, suspicious for pneumonia or aspiration. 2. Mild pulmonary edema. Electronically signed by: Waddell Calk MD 08/06/2024 05:18 AM EDT RP Workstation: [id number]    EKG: I independently viewed the EKG done and my findings are as followed: Sinus tachycardia at a rate of 100 bpm  Assessment/Plan Present on Admission:  Acute hypoxemic respiratory failure (HCC)  Uncontrolled type 2 diabetes mellitus with hyperglycemia (HCC)  OSA (obstructive sleep apnea)  Principal Problem:   Acute hypoxemic respiratory failure (HCC) Active Problems:   ESRD (end  stage renal disease) on dialysis (HCC)   Uncontrolled type 2 diabetes mellitus with hyperglycemia (HCC)   OSA (obstructive sleep apnea)   CAP (community acquired pneumonia)   Acute exacerbation of chronic obstructive pulmonary disease (COPD) (HCC)   Morbid obesity with BMI of 50.0-59.9, adult (HCC)  Acute hypoxemic respiratory failure possibly secondary to multifactorial Continue supplemental oxygen to maintain O2 sats > 92% and wean patient off this as tolerated  CAP POA Patient was started on ceftriaxone  and azithromycin, we shall continue same at this time with plan to de-escalate/discontinue based on blood culture, sputum culture, urine Legionella, strep pneumo and procalcitonin Continue Tylenol  as needed Continue Mucinex , incentive spirometry, flutter valve   Acute exacerbation of COPD Continue duo nebs, Mucinex , Solu-Medrol , azithromycin. Continue Protonix  to prevent steroid-induced ulcer Continue incentive spirometry and flutter valve Continue supplemental oxygen to maintain O2 sat > 92% with plan to wean patient off oxygen as tolerated  ESRD on HD Patient is fluid overloaded despite 5 L ultrafiltration on dialysis yesterday Nephrologist (Dr. Gearline) was already consulted and will arrange for outpatient dialysis today  Type 2 diabetes mellitus with hyperglycemia Hemoglobin A1c 9.1 on 10/9 Continue Semglee  10 units nightly and adjust dose accordingly Continue ISS and hypoglycemia protocol  Morbid obesity (BMI 50.21) Patient will need to follow-up with PCP for weight loss program  Essential hypertension Continue Norvasc , clonidine , Coreg   ??OSA Patient states he never had a sleep study He was encouraged to have 1 done    DVT prophylaxis: Subcu heparin   Code Status: Full code  Family Communication: Wife at bedside (all questions answered to satisfaction)  Consults: Nephrology  Severity of Illness: The appropriate patient status for this patient is INPATIENT.  Inpatient status is judged to be reasonable and necessary in order to provide the required intensity of service to ensure the patient's safety. The patient's presenting symptoms, physical exam findings, and initial radiographic and laboratory data in  the context of their chronic comorbidities is felt to place them at high risk for further clinical deterioration. Furthermore, it is not anticipated that the patient will be medically stable for discharge from the hospital within 2 midnights of admission.   * I certify that at the point of admission it is my clinical judgment that the patient will require inpatient hospital care spanning beyond 2 midnights from the point of admission due to high intensity of service, high risk for further deterioration and high frequency of surveillance required.*  Author: Demetrious Rainford, DO 08/06/2024 8:35 AM  For on call review [email or web address].

## 2024-08-06 NOTE — Plan of Care (Signed)
   Problem: Education: Goal: Knowledge of General Education information will improve Description: Including pain rating scale, medication(s)/side effects and non-pharmacologic comfort measures Outcome: Progressing   Problem: Health Behavior/Discharge Planning: Goal: Ability to manage health-related needs will improve Outcome: Progressing   Problem: Clinical Measurements: Goal: Will remain free from infection Outcome: Progressing

## 2024-08-06 NOTE — Progress Notes (Signed)
 Went in and set up CPAP machine for patient.  Placed settings on auto titration between 5 and 22.  Showed patient how to use mask and closures on side of mask.  Patient stated that he was familiar with mask and how to use machine.  Machine is at bedside.  Patient did state that he did not want to put machine on at this time.  I explained that I would be back later to see if he had placed machine on.

## 2024-08-06 NOTE — Progress Notes (Signed)
 The patient tolerated HD treatment well and goal met. The HD system clotted at last 10 mins.  08/06/24 1400  Vitals  Temp 98.6 F (37 C)  Temp Source Oral  BP (!) 170/100  BP Location Right Wrist  BP Method Automatic  Patient Position (if appropriate) Lying  Pulse Rate 90  Resp 18  Oxygen Therapy  SpO2 100 %  O2 Device Nasal Cannula  O2 Flow Rate (L/min) 2 L/min  During Treatment Monitoring  Intra-Hemodialysis Comments Tx completed  Post Treatment  Dialyzer Clearance Clotted  Hemodialysis Intake (mL) 0 mL  Liters Processed 80  Fluid Removed (mL) 4500 mL  Tolerated HD Treatment Yes  Post-Hemodialysis Comments see notes.  AVG/AVF Arterial Site Held (minutes) 7 minutes  AVG/AVF Venous Site Held (minutes) 7 minutes  Fistula / Graft Left Upper arm Arteriovenous fistula  Placement Date/Time: 11/11/19 0843   Placed prior to admission: No  Orientation: Left  Access Location: Upper arm  Access Type: Arteriovenous fistula  Site Condition No complications  Fistula / Graft Assessment Present;Thrill;Bruit  Status Deaccessed  Needle Size 15  Drainage Description None

## 2024-08-06 NOTE — Progress Notes (Signed)
 Progress Note   Patient: Kelly Key FMW:[id number] DOB: June 08, 1971 DOA: 08/06/2024     0 DOS: the patient was seen and examined on 08/06/2024   Brief hospital admission narrative: As per H&P written by Dr. Manfred on 08/06/2024 Kelly Key is a 53 y.o. male with medical history significant of hypertension, insulin -dependent diabetes mellitus, PAD, chronic anemia, COPD, and chronic HFpEF presents to the emergency department due to increasing shortness of breath. Patient was admitted on 10/9 and discharged yesterday (10/10) due to acute respiratory failure with hypoxia and hypervolemia associated with renal insufficiency.  He was discharged home after dialysis.  At home, patient complained of chest congestion, cough, increased shortness of breath and chills.  Wife is concerned that he may have flu.  Patient continued to complain of chest tightness and shortness of breath.  He denies nausea, vomiting, abdominal pain.   ED Course:  In the emergency department, BP was elevated at 220/103, pulse 103 bpm, respiratory rate 23/min, temperature 98.24F, O2 sat 94% on supplemental oxygen at 3 LPM (patient does not use oxygen at baseline).  Workup in the ED showed normocytic anemia.  BMP showed sodium 132, potassium 4.7, chloride 89, bicarb 28, blood glucose 435, BUN/creatinine 74/9.28, calcium  8.5, eGFR 6, anion gap 16, hemoglobin A1c 9.1.  Influenza A, B, SARS coronavirus 2, RSV was negative, blood culture pending Chest x-ray showed  Right basilar airspace opacities, suspicious for pneumonia or aspiration.Mild pulmonary edema. He was treated with ceftriaxone  and azithromycin, breathing treatment was provided, Solu-Medrol  125 mg IV was given and nitroglycerin  patch due to elevated BP was provided. Nephrologist (Dr. Gearline) was consulted and will arrange dialysis for patient  Assessment and plan 1-acute hypoxic respiratory failure - Appears to be multifactorial; concerns for COPD exacerbation, obesity  hypoventilation syndrome, hypervolemia/vascular congestion with component of acute on chronic diastolic heart failure and also community-acquired pneumonia. -Continue treatment with antibiotics, follow culture results, continue mucolytic's, incentive spirometer, flutter valve and procalcitonin levels. -Bronchodilator management and steroids will be provided for COPD exacerbation - Volume management will be pursuit with hemodialysis. - Follow daily weights, strict intake and output, low-sodium diet. -CPAP auto titration mode will be provided at night- - Wean off oxygen supplementation as tolerated and assess desaturation screening.  2-ESRD - Nephrology has been consulted for dialysis management - Patient is usually on Tuesday, Thursday and Saturday.  3-essential hypertension - Continue management with Norvasc , clonidine  and Coreg .  4-morbid obesity - Low-calorie diet, portion control and increase physical activity discussed with patient. -Body mass index is 51.76 kg/m.   5-GERD - Continue PPI  6-uncontrolled type 2 diabetes with hyperglycemia and nephropathy - Currently on dialysis as mentioned above - A1c 9.1 - Closely follow CBGs fluctuation and adjust hypoglycemic regimen as needed - Sliding scale insulin  and Lantus  has been initiated - Anticipating elevation in his CBGs while receiving his steroids.  7-anemia of chronic disease - No overt bleeding appreciated - Follow hemoglobin trend - Iron therapy and Epogen  therapy as per nephrology discretion.  Subjective:  No chest pain, no nausea vomiting.  Demonstrated mild difficulty speaking in full sentences and having tachypnea.  Patient has breath orthopnea and at time of evaluation he was semierect on ED stretcher.  3 L nasal cannula supplementation in place.  Physical Exam: Vitals:   08/06/24 0954 08/06/24 1016 08/06/24 1017 08/06/24 1022  BP:   (!) 159/80 (!) 159/83  Pulse:   89 89  Resp:   20 20  Temp: 99.7 F (37.6 C)  98.7 F (37.1 C)   TempSrc: Oral  Oral   SpO2:   100% 98%  Weight:  (!) 159 kg    Height:       General exam: Alert, awake, oriented x 3; mild difficulty speaking in full sentences.  Low-grade temperature (Tmax 99.7).  Positive tachypnea on 3 L nasal cannula supplementation in place. Respiratory system: Positive fine crackles at the bases and diffuse expiratory wheezing.  Respiratory rate 23-24 at time of exam. Cardiovascular system: Rate controlled, regular rhythm.  No rubs, no gallops, unable to assess JVD with body habitus. Gastrointestinal system: Abdomen is obese, nondistended, soft and nontender. No organomegaly or masses felt. Normal bowel sounds heard. Central nervous system: No focal neurological deficits. Extremities: No cyanosis or clubbing.  Trace edema appreciated on his legs bilaterally. Skin: No petechiae. Psychiatry: Judgement and insight appear normal. Mood & affect appropriate.    Data Reviewed: CBC: WBCs 11.6, hemoglobin 9.6 and platelet count 257K Basic metabolic panel: Sodium 133, potassium 4.3, chloride 90, bicarb 26, BUN 94, creatinine 10.70 and GFR 5; anion gap 16  Family Communication: Wife at bedside.  Disposition: Status is: Inpatient Remains inpatient appropriate because: IV therapy.  Anticipating discharge back home once medically stable.  Time spent: 50 minutes  Author: Eric Nunnery, MD 08/06/2024 10:50 AM  For on call review [email or web address].

## 2024-08-06 NOTE — Progress Notes (Addendum)
 HOSPITAL MEDICINE OVERNIGHT EVENT NOTE    10 PM blood sugar 537.  Giving 20 units of NovoLog .  Zachary JINNY Ba  MD [facility]   ADDENDUM (12/12 1:35am)  Sugar still 498.  Administering 15 units of NovoLog .  Zachary JINNY Ritu Gagliardo

## 2024-08-06 NOTE — ED Notes (Signed)
 To HD, no changes.

## 2024-08-06 NOTE — Progress Notes (Signed)
 Back after < 24 hrs discharged ESRD TTS Will do dialysis today on regularly scheduled day  Almarie Bonine MD [facility]

## 2024-08-06 NOTE — ED Triage Notes (Addendum)
 Pt with c/o SOB, cough, and congestion since being discharged yesterday. Pt scheduled for HD this am but states was feeling too poorly. Pt's O2 sats on R/A were 85%. Placed pt on O2 @ 4L [place] and sats up to 95%. EDP @ bedside.

## 2024-08-06 NOTE — ED Provider Notes (Signed)
 [place] EMERGENCY DEPARTMENT AT [facility] Provider Note   CSN: [id number] Arrival date & time: 08/06/24  9561     Patient presents with: Shortness of Breath and Cough   Kelly Key is a 53 y.o. male.   The history is provided by the patient and the spouse.  Cough Associated symptoms: chills   Patient with extensive history including CHF, COPD, ESRD on dialysis presents with increasing shortness of breath Patient was just discharged in the hospital October 10 for shortness of breath.  He did receive dialysis at that time. After going home, he reports that his cough congestion and shortness of breath and chills worsened.  Wife is concerned that he may have the flu.  He is due for dialysis later today. Patient reports mild chest tightness but is overall most concerned about his shortness of breath   Past Medical History:  Diagnosis Date   Acute diastolic CHF (congestive heart failure) (HCC) 06/11/2012   EF 50-55% [facility])   Acute gout of right knee 08/03/2019   Chronic kidney disease    COPD (chronic obstructive pulmonary disease) (HCC)    Diabetes mellitus    A1c 11.5 (06/11/2012).   Dyspnea    Gout    Hepatic steatosis 06/11/2012   Elevated LFTs   Hyperlipemia    Malignant hypertension    Microcytic anemia 06/12/2012   NSTEMI (non-ST elevated myocardial infarction) (HCC) 05/17/2021   Obesity    Thromboembolism (HCC) 02/20/2019    Prior to Admission medications   Medication Sig Start Date End Date Taking? Authorizing Provider  acetaminophen  (TYLENOL ) 500 MG tablet Take 1,000 mg by mouth every 6 (six) hours as needed for moderate pain or headache.    [provider]  albuterol  (VENTOLIN  HFA) 108 (90 Base) MCG/ACT inhaler Inhale 2 puffs into the lungs every 4 (four) hours as needed for wheezing or shortness of breath. 08/01/24   Yolande Lamar BROCKS, MD  allopurinol  (ZYLOPRIM ) 100 MG tablet Take 2 tablets (200 mg total) by mouth daily.  08/04/19   Love, Sharlet RAMAN, PA-C  amLODipine  (NORVASC ) 10 MG tablet Take 1 tablet (10 mg total) by mouth daily. 04/04/24   Miriam Norris, NP  aspirin  EC 81 MG tablet Take 1 tablet (81 mg total) by mouth daily. 05/06/23   Pearlean Manus, MD  atorvastatin  (LIPITOR ) 80 MG tablet Take 1 tablet (80 mg total) by mouth daily. 06/13/24   Miriam Norris, NP  carvedilol  (COREG ) 25 MG tablet Take 1 tablet (25 mg total) by mouth 2 (two) times daily with a meal. 05/06/23   Emokpae, Courage, MD  cloNIDine  (CATAPRES ) 0.3 MG tablet Take 1 tablet (0.3 mg total) by mouth 3 (three) times daily. 05/06/23   Pearlean Manus, MD  clopidogrel  (PLAVIX ) 75 MG tablet Take 75 mg by mouth daily. 08/26/23   [provider]  ezetimibe  (ZETIA ) 10 MG tablet Take 10 mg by mouth every evening. 01/20/19   [provider]  ferrous sulfate  325 (65 FE) MG tablet Take 1 tablet (325 mg total) by mouth 2 (two) times daily with a meal. 09/30/23 08/04/24  Von Bellis, MD  fluticasone  (FLONASE ) 50 MCG/ACT nasal spray Place 1 spray into both nostrils at bedtime as needed for allergies or rhinitis.    [provider]  furosemide  (LASIX ) 40 MG tablet Please take 2.5 tablets (100 Mg daily) on Dialysis days, and 2 tablets Twice daily on Non Dialysis days (80 Mg Twice Daily) 04/04/24   Miriam Norris, NP  gabapentin  (NEURONTIN ) 100 MG capsule Take 1 capsule (100 mg total) by mouth 3 (three) times daily. 08/03/19   Love, Sharlet RAMAN, PA-C  glucose blood (ACCU-CHEK AVIVA PLUS) test strip Use as instructed TO CHECK BLOOD GLUCOSE THREE TIMES DAILY 08/15/20   Nida, Gebreselassie W, MD  HUMALOG KWIKPEN 100 UNIT/ML KwikPen Inject 8-30 Units into the skin 3 (three) times daily. Sliding scale 30 units max TID 08/19/22   [provider]  Insulin  Pen Needle (B-D ULTRAFINE III SHORT PEN) 31G X 8 MM MISC 1 each by Does not apply route as directed. 08/13/20   Nida, Gebreselassie W, MD  Ipratropium-Albuterol  (COMBIVENT ) 20-100 MCG/ACT  AERS respimat Inhale 1 puff into the lungs every 6 (six) hours as needed for wheezing or shortness of breath. 09/30/23 08/04/24  Von Bellis, MD  isosorbide  mononitrate (IMDUR ) 30 MG 24 hr tablet Take 0.5 tablets (15 mg total) by mouth daily with supper. 08/05/24 11/03/24  Laurence Locus, DO  linaclotide  (LINZESS ) 145 MCG CAPS capsule Take 145 mcg by mouth daily as needed (constipation).    [provider]  multivitamin (RENA-VIT) TABS tablet Take 1 tablet by mouth at bedtime. 06/16/24   [provider]  TRESIBA FLEXTOUCH 100 UNIT/ML FlexTouch Pen Inject 20 Units into the skin at bedtime. 06/29/24   [provider]  VELPHORO  500 MG chewable tablet Chew 500 mg by mouth 3 (three) times daily. 08/13/23   [provider]    Allergies: Ozempic  (0.25 or 0.5 mg-dose) [semaglutide (0.25 or 0.5mg -dos)], Hydralazine , and Lisinopril    Review of Systems  Constitutional:  Positive for chills.    Updated Vital Signs BP (!) 184/170   Pulse (!) 102   Temp 98.7 F (37.1 C) (Oral)   Resp (!) 27   Ht 1.753 m (5' 9)   Wt (!) 154.2 kg   SpO2 96%   BMI 50.21 kg/m   Physical Exam CONSTITUTIONAL: Ill-appearing HEAD: Normocephalic/atraumatic EYES: EOMI/PERRL ENMT: Mucous membranes moist NECK: supple no meningeal signs CV: S1/S2 noted, no murmurs/rubs/gallops noted LUNGS: Tachypneic and decreased breath sounds bilaterally, wheezing is noted ABDOMEN: soft, nontender NEURO: Pt is awake/alert/appropriate, moves all extremitiesx4.  No facial droop.   EXTREMITIES: pulses normal/equal, full ROM SKIN: warm, color normal  (all labs ordered are listed, but only abnormal results are displayed) Labs Reviewed  BASIC METABOLIC PANEL WITH GFR - Abnormal; Notable for the following components:      Result Value   Sodium 133 (*)    Chloride 90 (*)    Glucose, Bld 232 (*)    BUN 94 (*)    Creatinine, Ser 10.70 (*)    Calcium  8.7 (*)    GFR, Estimated 5 (*)    Anion gap 16 (*)     All other components within normal limits  CBC WITH DIFFERENTIAL/PLATELET - Abnormal; Notable for the following components:   WBC 11.6 (*)    RBC 3.34 (*)    Hemoglobin 9.6 (*)    HCT 29.7 (*)    Neutro Abs 8.5 (*)    Monocytes Absolute 1.1 (*)    All other components within normal limits  RESP PANEL BY RT-PCR (RSV, FLU A&B, COVID)  [id number]    EKG: ED ECG REPORT   Date: 08/06/2024 0456  Rate: 100  Rhythm: sinus tachycardia  QRS Axis: left  Intervals: normal  ST/T Wave abnormalities: nonspecific ST changes  Conduction Disutrbances:none  Narrative Interpretation:   Old EKG Reviewed: unchanged  I have personally reviewed the EKG tracing and  agree with the computerized printout as noted.   Radiology: DG Chest Portable 1 View Result Date: 08/06/2024 EXAM: 1 VIEW(S) XRAY OF THE CHEST 08/06/2024 05:10:07 AM COMPARISON: 08/04/2024 CLINICAL HISTORY: sob. Pt with c/o SOB, cough, and congestion since being discharged yesterday. Pt scheduled for HD this am but states was feeling too poorly. Pt's O2 sats on R/A were 85%. Placed pt on O2 @ 4L [place] and sats up to 95%. EDP @ bedside. FINDINGS: LUNGS AND PLEURA: Right basilar airspace opacities. Mild pulmonary edema. No pleural effusion. No pneumothorax. HEART AND MEDIASTINUM: No acute abnormality of the cardiac and mediastinal silhouettes. BONES AND SOFT TISSUES: No acute osseous abnormality. IMPRESSION: 1. Right basilar airspace opacities, suspicious for pneumonia or aspiration. 2. Mild pulmonary edema. Electronically signed by: Waddell Calk MD 08/06/2024 05:18 AM EDT RP Workstation: [id number]     .Critical Care  Performed by: Midge Golas, MD Authorized by: Midge Golas, MD   Critical care provider statement:    Critical care time (minutes):  45   Critical care start time:  08/06/2024 5:15 AM   Critical care end time:  08/06/2024 6:00 AM   Critical care time was exclusive of:  Separately billable procedures and treating other  patients   Critical care was necessary to treat or prevent imminent or life-threatening deterioration of the following conditions:  Respiratory failure, renal failure and cardiac failure   Critical care was time spent personally by me on the following activities:  Examination of patient, discussions with consultants, pulse oximetry, ordering and review of radiographic studies, ordering and review of laboratory studies, ordering and performing treatments and interventions, re-evaluation of patient's condition, review of old charts, evaluation of patient's response to treatment, obtaining history from patient or surrogate and development of treatment plan with patient or surrogate   I assumed direction of critical care for this patient from another provider in my specialty: no     Care discussed with: admitting provider      Medications Ordered in the ED  ipratropium-albuterol  (DUONEB) 0.5-2.5 (3) MG/3ML nebulizer solution 3 mL (has no administration in time range)  cefTRIAXone  (ROCEPHIN ) 1 g in sodium chloride  0.9 % 100 mL IVPB (1 g Intravenous New Bag/Given 08/06/24 0538)  azithromycin (ZITHROMAX) 500 mg in sodium chloride  0.9 % 250 mL IVPB (has no administration in time range)  methylPREDNISolone  sodium succinate (SOLU-MEDROL ) 125 mg/2 mL injection 125 mg (has no administration in time range)  nitroGLYCERIN  (NITROGLYN) 2 % ointment 1 inch (1 inch Topical Given 08/06/24 0529)    Clinical Course as of 08/06/24 0602  Sat Aug 06, 2024  0515 Patient was just discharged from the hospital, now reporting increased cough congestion and shortness of breath.  Patient reports he is not on home oxygen.  Workup is pending at this time, anticipate readmission [DW]  [phone] Patient shortness of breath is likely multifactorial from potential pneumonia, volume overload as well as a COPD exacerbation as he does have wheeze.  For the wheezing, neb treatments and steroids have been ordered.  Patient has significantly  elevated blood pressure with evidence of edema, will give nitroglycerin .  Will also start IV antibiotics. [DW]  [phone] Discussed the case with Dr. Gearline with nephrology, who will arrange dialysis later today [DW]  0601 Discussed the case with Dr. Adefeso for admission [DW]    Clinical Course User Index [DW] Midge Golas, MD  Medical Decision Making Amount and/or Complexity of Data Reviewed Labs: ordered. Radiology: ordered.  Risk Prescription drug management. Decision regarding hospitalization.   This patient presents to the ED for concern of shortness of breath, this involves an extensive number of treatment options, and is a complaint that carries with it a high risk of complications and morbidity.  The differential diagnosis includes but is not limited to Acute coronary syndrome, pneumonia, acute pulmonary edema, pneumothorax, acute anemia, pulmonary embolism COVID-19, influenza  Comorbidities that complicate the patient evaluation: Patient's presentation is complicated by their history of ESRD, hypertension  Social Determinants of Health: Patient's multiple ER visits  increases the complexity of managing their presentation  Additional history obtained: Additional history obtained from spouse Records reviewed previous admission documents  Lab Tests: I Ordered, and personally interpreted labs.  The pertinent results include: Renal failure, mild leukocytosis  Imaging Studies ordered: I ordered imaging studies including X-ray chest  I independently visualized and interpreted imaging which showed pneumonia and edema I agree with the radiologist interpretation  Cardiac Monitoring: The patient was maintained on a cardiac monitor.  I personally viewed and interpreted the cardiac monitor which showed an underlying rhythm of:  sinus rhythm  Medicines ordered and prescription drug management: I ordered medication including nebulized therapies for  wheeze Nitroglycerin  for blood pressure management   Critical Interventions:   patient for respiratory failure, IV antibiotics  Consultations Obtained: I requested consultation with the admitting physician [facility] Dr. Adefeso and consultant nephrology Dr. Gearline, and discussed  findings as well as pertinent plan - they recommend: Will admit  Reevaluation: After the interventions noted above, I reevaluated the patient and found that they have :stayed the same  Complexity of problems addressed: Patient's presentation is most consistent with  acute presentation with potential threat to life or bodily function  Disposition: After consideration of the diagnostic results and the patient's response to treatment,  I feel that the patent would benefit from admission  .   At time of admission, patient was awake and alert and protecting his airway.  He will need aggressive treatment with neb treatments, as well as dialysis.     Final diagnoses:  Community acquired pneumonia of right lower lobe of lung  ESRD (end stage renal disease) (HCC)  COPD exacerbation [facility])    ED Discharge Orders     None          Midge Golas, MD 08/06/24 0602

## 2024-08-06 NOTE — ED Notes (Signed)
 Pt sleeping, arousable to voice, NAD, calm, interactive, speech clear. Denies pain ro nausea. Endorses some sob and dizziness. States, ready for HD. Family at [facility].

## 2024-08-06 NOTE — TOC Initial Note (Signed)
 Transition of Care [facility]) - Initial/Assessment Note    Patient Details  Name: Kelly Key MRN: [id number] Date of Birth: 02/04/1971  Transition of Care [facility]) CM/SW Contact:    Sharlyne Stabs, RN Phone Number: 08/06/2024, 2:37 PM  Clinical Narrative:     Patient admitted with acute hypoxemic respiratory failure.  HD patient, want home oxygen, Patient 's sat 100% on 2L, does not need oxygen, MD will discuss. IPCM following.              Expected Discharge Plan: Home/Self Care Barriers to Discharge: Continued Medical Work up   Patient Goals and CMS Choice Patient states their goals for this hospitalization and ongoing recovery are:: return home CMS Medicare.gov Compare Post Acute Care list provided to:: Patient Choice offered to / list presented to : Patient [place] ownership interest in [facility].provided to:: Patient    Expected Discharge Plan and Services      ADL Screening (condition at time of admission) Independently performs ADLs?: Yes (appropriate for developmental age) Is the patient deaf or have difficulty hearing?: No Does the patient have difficulty seeing, even when wearing glasses/contacts?: No Does the patient have difficulty concentrating, remembering, or making decisions?: No  Permission Sought/Granted       Admission diagnosis:  Acute hypoxemic respiratory failure (HCC) [J96.01] Acute respiratory failure (HCC) [J96.00] Patient Active Problem List   Diagnosis Date Noted   Acute hypoxemic respiratory failure (HCC) 08/06/2024   Acute respiratory failure (HCC) 08/06/2024   CAP (community acquired pneumonia) 08/06/2024   Acute exacerbation of chronic obstructive pulmonary disease (COPD) (HCC) 08/06/2024   Morbid obesity with BMI of 50.0-59.9, adult (HCC) 08/06/2024   Acute respiratory failure with hypoxia (HCC) 08/04/2024   ESRD (end stage renal disease) on dialysis (HCC) 05/06/2023   PAD (peripheral artery disease) 12/01/2022   GERD  (gastroesophageal reflux disease) 11/27/2022   Dysphagia 10/18/2022   OSA (obstructive sleep apnea) 10/04/2021   Obesity hypoventilation syndrome (HCC) 10/04/2021   Organic impotence 08/16/2021   Hypertension secondary to other renal disorders 08/31/2020   Orthostasis    Pain    Physical debility 07/16/2019   Anemia of chronic disease    Uncontrolled type 2 diabetes mellitus with hyperglycemia (HCC)    Hypervolemia associated with renal insufficiency 07/05/2019   Anasarca 07/03/2019   Dyslipidemia 07/03/2019   Chronic combined systolic and diastolic CHF (congestive heart failure) (HCC) 07/03/2019   Bilateral leg edema    Acute on chronic diastolic CHF (congestive heart failure) (HCC) 03/26/2018   Microcytic anemia 06/12/2012   Essential hypertension, benign 06/11/2012   Hepatic steatosis 06/11/2012   Super obesity 06/11/2012   PCP:  Luke Agent, MD (Inactive) Pharmacy:   [facility] [address] [place] - [address] [place] [address] Phone: [phone] Fax: [phone]     Social Drivers of Health (SDOH) Social History: SDOH Screenings   Food Insecurity: No Food Insecurity (08/06/2024)  Housing: Unknown (08/06/2024)  Transportation Needs: No Transportation Needs (08/06/2024)  Utilities: Not At Risk (08/06/2024)  Depression (PHQ2-9): Low Risk  (12/25/2021)  Financial Resource Strain: Low Risk  (02/20/2019)  Physical Activity: Unknown (02/20/2019)  Social Connections: Unknown (02/20/2019)  Stress: No Stress Concern Present (02/20/2019)  Tobacco Use: Low Risk  (08/06/2024)   SDOH Interventions:     Readmission Risk Interventions    08/04/2024   10:28 AM 09/29/2023   11:45 AM 01/12/2023    1:18 PM  Readmission Risk Prevention Plan  Transportation Screening  Complete Complete   PCP or Specialist Appt within 3-5 Days  Complete   HRI or Home Care Consult Complete Complete   Social Work Consult for Recovery Care Planning/Counseling Complete Complete    Palliative Care Screening Not Applicable Complete   Medication Review Oceanographer) Complete Complete Complete  HRI or Home Care Consult   Complete  SW Recovery Care/Counseling Consult   Complete  Palliative Care Screening   Not Applicable  Skilled Nursing Facility   Not Applicable

## 2024-08-07 DIAGNOSIS — N186 End stage renal disease: Secondary | ICD-10-CM | POA: Diagnosis not present

## 2024-08-07 DIAGNOSIS — G4733 Obstructive sleep apnea (adult) (pediatric): Secondary | ICD-10-CM | POA: Diagnosis not present

## 2024-08-07 DIAGNOSIS — J189 Pneumonia, unspecified organism: Secondary | ICD-10-CM | POA: Diagnosis not present

## 2024-08-07 DIAGNOSIS — J9601 Acute respiratory failure with hypoxia: Secondary | ICD-10-CM | POA: Diagnosis not present

## 2024-08-07 LAB — CBC
HCT: 28.6 % — ABNORMAL LOW (ref 39.0–52.0)
Hemoglobin: 9.6 g/dL — ABNORMAL LOW (ref 13.0–17.0)
MCH: 29.4 pg (ref 26.0–34.0)
MCHC: 33.6 g/dL (ref 30.0–36.0)
MCV: 87.7 fL (ref 80.0–100.0)
Platelets: 271 K/uL (ref 150–400)
RBC: 3.26 MIL/uL — ABNORMAL LOW (ref 4.22–5.81)
RDW: 14.5 % (ref 11.5–15.5)
WBC: 11.7 K/uL — ABNORMAL HIGH (ref 4.0–10.5)
nRBC: 0 % (ref 0.0–0.2)

## 2024-08-07 LAB — PHOSPHORUS: Phosphorus: 5.7 mg/dL — ABNORMAL HIGH (ref 2.5–4.6)

## 2024-08-07 LAB — GLUCOSE, CAPILLARY
Glucose-Capillary: 377 mg/dL — ABNORMAL HIGH (ref 70–99)
Glucose-Capillary: 440 mg/dL — ABNORMAL HIGH (ref 70–99)
Glucose-Capillary: 452 mg/dL — ABNORMAL HIGH (ref 70–99)
Glucose-Capillary: 471 mg/dL — ABNORMAL HIGH (ref 70–99)
Glucose-Capillary: 498 mg/dL — ABNORMAL HIGH (ref 70–99)
Glucose-Capillary: 541 mg/dL (ref 70–99)
Glucose-Capillary: >600 mg/dL (ref 70–99)
Glucose-Capillary: >600 mg/dL (ref 70–99)

## 2024-08-07 LAB — BASIC METABOLIC PANEL WITH GFR
Anion gap: 19 — ABNORMAL HIGH (ref 5–15)
BUN: 104 mg/dL — ABNORMAL HIGH (ref 6–20)
CO2: 24 mmol/L (ref 22–32)
Calcium: 8.3 mg/dL — ABNORMAL LOW (ref 8.9–10.3)
Chloride: 85 mmol/L — ABNORMAL LOW (ref 98–111)
Creatinine, Ser: 10.6 mg/dL — ABNORMAL HIGH (ref 0.61–1.24)
GFR, Estimated: 5 mL/min — ABNORMAL LOW (ref >60)
Glucose, Bld: 499 mg/dL — ABNORMAL HIGH (ref 70–99)
Potassium: 4.8 mmol/L (ref 3.5–5.1)
Sodium: 128 mmol/L — ABNORMAL LOW (ref 135–145)

## 2024-08-07 LAB — MAGNESIUM: Magnesium: 2.8 mg/dL — ABNORMAL HIGH (ref 1.7–2.4)

## 2024-08-07 LAB — GLUCOSE, RANDOM: Glucose, Bld: 434 mg/dL — ABNORMAL HIGH (ref 70–99)

## 2024-08-07 MED ORDER — INSULIN ASPART 100 UNIT/ML IJ SOLN: 0.0000 [IU] | Freq: Every day | INTRAMUSCULAR | Status: DC

## 2024-08-07 MED ORDER — METHYLPREDNISOLONE SODIUM SUCC 40 MG IJ SOLR: 40.0000 mg | Freq: Every day | INTRAMUSCULAR | Status: DC

## 2024-08-07 MED ORDER — INSULIN ASPART 100 UNIT/ML IJ SOLN
15.0000 [IU] | Freq: Once | INTRAMUSCULAR | Status: AC
  Administered 2024-08-07: 15 [IU] via SUBCUTANEOUS

## 2024-08-07 MED ORDER — INSULIN ASPART 100 UNIT/ML IJ SOLN
0.0000 [IU] | Freq: Three times a day (TID) | INTRAMUSCULAR | Status: DC
  Administered 2024-08-08: 11 [IU] via SUBCUTANEOUS
  Administered 2024-08-08 (×2): 15 [IU] via SUBCUTANEOUS

## 2024-08-07 MED ORDER — LABETALOL HCL 5 MG/ML IV SOLN
10.0000 mg | Freq: Four times a day (QID) | INTRAVENOUS | Status: DC | PRN
  Administered 2024-08-07 – 2024-08-08 (×2): 10 mg via INTRAVENOUS
  Filled 2024-08-07 (×2): qty 4

## 2024-08-07 MED ORDER — INSULIN ASPART 100 UNIT/ML IJ SOLN
12.0000 [IU] | Freq: Once | INTRAMUSCULAR | Status: AC
  Administered 2024-08-07: 12 [IU] via SUBCUTANEOUS

## 2024-08-07 MED ORDER — INSULIN GLARGINE 100 UNIT/ML ~~LOC~~ SOLN
20.0000 [IU] | Freq: Two times a day (BID) | SUBCUTANEOUS | Status: DC
  Administered 2024-08-07 – 2024-08-08 (×2): 20 [IU] via SUBCUTANEOUS
  Filled 2024-08-07 (×5): qty 0.2

## 2024-08-07 NOTE — Plan of Care (Signed)

## 2024-08-07 NOTE — Progress Notes (Signed)
 Progress Note   Patient: Kelly Key FMW:[id number] DOB: 07/01/1971 DOA: 08/06/2024     1 DOS: the patient was seen and examined on 08/07/2024   Brief hospital admission narrative: As per H&P written by Dr. Manfred on 08/06/2024 Kelly Key is a 53 y.o. male with medical history significant of hypertension, insulin -dependent diabetes mellitus, PAD, chronic anemia, COPD, and chronic HFpEF presents to the emergency department due to increasing shortness of breath. Patient was admitted on 10/9 and discharged yesterday (10/10) due to acute respiratory failure with hypoxia and hypervolemia associated with renal insufficiency.  He was discharged home after dialysis.  At home, patient complained of chest congestion, cough, increased shortness of breath and chills.  Wife is concerned that he may have flu.  Patient continued to complain of chest tightness and shortness of breath.  He denies nausea, vomiting, abdominal pain.   ED Course:  In the emergency department, BP was elevated at 220/103, pulse 103 bpm, respiratory rate 23/min, temperature 98.23F, O2 sat 94% on supplemental oxygen at 3 LPM (patient does not use oxygen at baseline).  Workup in the ED showed normocytic anemia.  BMP showed sodium 132, potassium 4.7, chloride 89, bicarb 28, blood glucose 435, BUN/creatinine 74/9.28, calcium  8.5, eGFR 6, anion gap 16, hemoglobin A1c 9.1.  Influenza A, B, SARS coronavirus 2, RSV was negative, blood culture pending Chest x-ray showed  Right basilar airspace opacities, suspicious for pneumonia or aspiration.Mild pulmonary edema. He was treated with ceftriaxone  and azithromycin, breathing treatment was provided, Solu-Medrol  125 mg IV was given and nitroglycerin  patch due to elevated BP was provided. Nephrologist (Dr. Gearline) was consulted and will arrange dialysis for patient  Assessment and plan 1-acute hypoxic respiratory failure - Appears to be multifactorial; concerns for COPD exacerbation, obesity  hypoventilation syndrome, hypervolemia/vascular congestion with component of acute on chronic diastolic heart failure and also community-acquired pneumonia. -Continue treatment with antibiotics, follow culture results, continue mucolytic's, incentive spirometer, flutter valve and procalcitonin levels. -Bronchodilator management and steroids will be provided for COPD exacerbation - Volume management will be pursuit with hemodialysis; anticipating next treatment 08/08/2024. - Follow daily weights, strict intake and output, low-sodium diet. -CPAP auto titration mode will be continue at night- - Wean off oxygen supplementation as tolerated and assess desaturation screening.  2-ESRD - Nephrology has been consulted for dialysis management - Patient is usually on Tuesday, Thursday and Saturday. -Patient had dialysis on 08/06/2024; well-tolerated. - Extra treatment anticipated on 08/08/2024  3-essential hypertension - Continue management with Norvasc , clonidine  and Coreg .  4-morbid obesity - Low-calorie diet, portion control and increase physical activity discussed with patient. -Body mass index is 49.39 kg/m.   5-GERD - Continue PPI  6-uncontrolled type 2 diabetes with hyperglycemia and nephropathy - Currently on dialysis as mentioned above - A1c 9.1 - Closely follow CBGs fluctuation and adjust hypoglycemic regimen as needed - Hyperglycemia appreciated in the setting of his steroid usage - Lantus  dose has been adjusted.  Continue sliding scale insulin .  7-anemia of chronic disease - No overt bleeding appreciated - Follow hemoglobin trend - Iron therapy and Epogen  therapy as per nephrology discretion.  Subjective:  No requiring oxygen supplementation on today's examination; patient is afebrile and reporting breathing better.  No chest pain, no nausea, no vomiting.  Physical Exam: Vitals:   08/07/24 0404 08/07/24 0841 08/07/24 1440 08/07/24 1450  BP: (!) 179/91  (!) 181/94   Pulse:  96  98   Resp: 12  20   Temp: (!) 97.5 F (36.4  C)  97.8 F (36.6 C)   TempSrc: Oral  Oral   SpO2: 96% 96% 99% 93%  Weight: (!) 151.7 kg     Height:       General exam: Alert, awake, oriented x 3; attentive examination no requiring oxygen supplementation and reporting feeling better. Respiratory system: Improved air movement bilaterally; very little respiratory wheezing appreciated on exam.  Decreased breath sounds at the bases. Cardiovascular system:RRR.  No rubs or gallops; unable to assess JVD due to body habitus. Gastrointestinal system: Abdomen is obese, nondistended, soft and nontender. No organomegaly or masses felt. Normal bowel sounds heard. Central nervous system: No focal neurological deficits. Extremities: No cyanosis or clubbing.  1-2+ edema appreciated bilaterally. Skin: No petechiae. Psychiatry: Judgement and insight appear normal. Mood & affect appropriate.   Data Reviewed: CBC: WBCs 11.6, hemoglobin 9.6 and platelet count 257K Basic metabolic panel: Sodium 128, potassium 4.8, chloride 85, bicarb 24, glucose 499, BUN 104, creatinine 2.60 and GFR 5 Magnesium : 2.8  Family Communication: Wife at bedside.  Disposition: Status is: Inpatient Remains inpatient appropriate because: IV therapy.  Anticipating discharge back home once medically stable.  Time spent: 50 minutes  Author: Eric Nunnery, MD 08/07/2024 4:19 PM  For on call review [email or web address].

## 2024-08-07 NOTE — Progress Notes (Signed)
 I went in to give patient his breathing treatment, asked patient if he had tried his CPAP machine, patient stated that he put it on for about an hour and took it back off.  I did not put on flowsheet because by the way the patient was acting about the CPAP machine, I did not believe he would actually try it.  The individual in his room has been actually encouraging patient to wear his CPAP.  Hopefully he will try again.

## 2024-08-07 NOTE — Progress Notes (Signed)
 Patient HS cbg 537.  Dr Vangie made aware, order received to give 19 units of novolog  along with Lantus  10 units.  Repeat CBG 2 hours is 498.  MD made aware, additional 15 units ordered.  Will follow up one hour post

## 2024-08-07 NOTE — Plan of Care (Signed)
   Problem: Education: Goal: Knowledge of General Education information will improve Description: Including pain rating scale, medication(s)/side effects and non-pharmacologic comfort measures Outcome: Progressing   Problem: Health Behavior/Discharge Planning: Goal: Ability to manage health-related needs will improve Outcome: Progressing   Problem: Clinical Measurements: Goal: Ability to maintain clinical measurements within normal limits will improve Outcome: Progressing Goal: Will remain free from infection Outcome: Progressing Goal: Diagnostic test results will improve Outcome: Progressing Goal: Respiratory complications will improve Outcome: Progressing Goal: Cardiovascular complication will be avoided Outcome: Progressing   Problem: Activity: Goal: Risk for activity intolerance will decrease Outcome: Progressing   Problem: Nutrition: Goal: Adequate nutrition will be maintained Outcome: Progressing   Problem: Coping: Goal: Level of anxiety will decrease Outcome: Progressing   Problem: Elimination: Goal: Will not experience complications related to bowel motility Outcome: Progressing Goal: Will not experience complications related to urinary retention Outcome: Progressing   Problem: Pain Managment: Goal: General experience of comfort will improve and/or be controlled Outcome: Progressing   Problem: Safety: Goal: Ability to remain free from injury will improve Outcome: Progressing   Problem: Skin Integrity: Goal: Risk for impaired skin integrity will decrease Outcome: Progressing   Problem: Education: Goal: Ability to describe self-care measures that may prevent or decrease complications (Diabetes Survival Skills Education) will improve Outcome: Progressing Goal: Individualized Educational Video(s) Outcome: Progressing   Problem: Coping: Goal: Ability to adjust to condition or change in health will improve Outcome: Progressing   Problem: Fluid  Volume: Goal: Ability to maintain a balanced intake and output will improve Outcome: Progressing   Problem: Health Behavior/Discharge Planning: Goal: Ability to identify and utilize available resources and services will improve Outcome: Progressing Goal: Ability to manage health-related needs will improve Outcome: Progressing   Problem: Metabolic: Goal: Ability to maintain appropriate glucose levels will improve Outcome: Progressing   Problem: Nutritional: Goal: Maintenance of adequate nutrition will improve Outcome: Progressing Goal: Progress toward achieving an optimal weight will improve Outcome: Progressing   Problem: Skin Integrity: Goal: Risk for impaired skin integrity will decrease Outcome: Progressing   Problem: Tissue Perfusion: Goal: Adequacy of tissue perfusion will improve Outcome: Progressing   Problem: Activity: Goal: Ability to tolerate increased activity will improve Outcome: Progressing   Problem: Clinical Measurements: Goal: Ability to maintain a body temperature in the normal range will improve Outcome: Progressing   Problem: Respiratory: Goal: Ability to maintain adequate ventilation will improve Outcome: Progressing Goal: Ability to maintain a clear airway will improve Outcome: Progressing

## 2024-08-07 NOTE — Progress Notes (Signed)
 Patient does not want to go on machine till midnight.

## 2024-08-08 DIAGNOSIS — J189 Pneumonia, unspecified organism: Secondary | ICD-10-CM | POA: Diagnosis not present

## 2024-08-08 DIAGNOSIS — J9601 Acute respiratory failure with hypoxia: Secondary | ICD-10-CM | POA: Diagnosis not present

## 2024-08-08 DIAGNOSIS — N186 End stage renal disease: Secondary | ICD-10-CM | POA: Diagnosis not present

## 2024-08-08 LAB — RENAL FUNCTION PANEL
Albumin: 4.1 g/dL (ref 3.5–5.0)
Anion gap: 25 — ABNORMAL HIGH (ref 5–15)
BUN: 124 mg/dL — ABNORMAL HIGH (ref 6–20)
CO2: 19 mmol/L — ABNORMAL LOW (ref 22–32)
Calcium: 7.7 mg/dL — ABNORMAL LOW (ref 8.9–10.3)
Chloride: 83 mmol/L — ABNORMAL LOW (ref 98–111)
Creatinine, Ser: 11.6 mg/dL — ABNORMAL HIGH (ref 0.61–1.24)
GFR, Estimated: 5 mL/min — ABNORMAL LOW (ref >60)
Glucose, Bld: 503 mg/dL (ref 70–99)
Phosphorus: 6.9 mg/dL — ABNORMAL HIGH (ref 2.5–4.6)
Potassium: 5 mmol/L (ref 3.5–5.1)
Sodium: 127 mmol/L — ABNORMAL LOW (ref 135–145)

## 2024-08-08 LAB — GLUCOSE, CAPILLARY
Glucose-Capillary: 480 mg/dL — ABNORMAL HIGH (ref 70–99)
Glucose-Capillary: 493 mg/dL — ABNORMAL HIGH (ref 70–99)
Glucose-Capillary: 345 mg/dL — ABNORMAL HIGH (ref 70–99)
Glucose-Capillary: 433 mg/dL — ABNORMAL HIGH (ref 70–99)

## 2024-08-08 LAB — HEPATITIS B SURFACE ANTIBODY, QUANTITATIVE: Hep B S AB Quant (Post): 14.9 m[IU]/mL

## 2024-08-08 LAB — CBC
HCT: 29.2 % — ABNORMAL LOW (ref 39.0–52.0)
Hemoglobin: 9.8 g/dL — ABNORMAL LOW (ref 13.0–17.0)
MCH: 29.5 pg (ref 26.0–34.0)
MCHC: 33.6 g/dL (ref 30.0–36.0)
MCV: 88 fL (ref 80.0–100.0)
Platelets: 311 K/uL (ref 150–400)
RBC: 3.32 MIL/uL — ABNORMAL LOW (ref 4.22–5.81)
RDW: 14.5 % (ref 11.5–15.5)
WBC: 14.5 K/uL — ABNORMAL HIGH (ref 4.0–10.5)
nRBC: 0 % (ref 0.0–0.2)

## 2024-08-08 MED ORDER — PENTAFLUOROPROP-TETRAFLUOROETH EX AERO
INHALATION_SPRAY | CUTANEOUS | Status: AC
  Filled 2024-08-08: qty 30

## 2024-08-08 MED ORDER — PREDNISONE 20 MG PO TABS
40.0000 mg | ORAL_TABLET | Freq: Every day | ORAL | Status: DC
  Administered 2024-08-08: 40 mg via ORAL
  Filled 2024-08-08: qty 2

## 2024-08-08 MED ORDER — AMLODIPINE BESYLATE 5 MG PO TABS
10.0000 mg | ORAL_TABLET | Freq: Every day | ORAL | Status: DC
  Administered 2024-08-08: 10 mg via ORAL
  Filled 2024-08-08: qty 2

## 2024-08-08 MED ORDER — SPIRONOLACTONE 25 MG PO TABS
25.0000 mg | ORAL_TABLET | Freq: Every day | ORAL | Status: DC
  Administered 2024-08-08: 25 mg via ORAL
  Filled 2024-08-08: qty 1

## 2024-08-08 MED ORDER — INSULIN ASPART 100 UNIT/ML IJ SOLN
10.0000 [IU] | Freq: Once | INTRAMUSCULAR | Status: AC
  Administered 2024-08-08: 10 [IU] via SUBCUTANEOUS

## 2024-08-08 MED ORDER — PANTOPRAZOLE SODIUM 40 MG PO TBEC: 40.0000 mg | DELAYED_RELEASE_TABLET | Freq: Every day | ORAL | 1 refills | Status: AC

## 2024-08-08 MED ORDER — SPIRONOLACTONE 25 MG PO TABS: 25.0000 mg | ORAL_TABLET | Freq: Every day | ORAL | 2 refills | Status: DC

## 2024-08-08 MED ORDER — PREDNISONE 20 MG PO TABS: ORAL_TABLET | ORAL | 0 refills | Status: DC

## 2024-08-08 MED ORDER — AMOXICILLIN-POT CLAVULANATE 500-125 MG PO TABS: 1.0000 | ORAL_TABLET | Freq: Every day | ORAL | 0 refills | Status: DC

## 2024-08-08 MED ORDER — CLONIDINE HCL 0.2 MG PO TABS
0.3000 mg | ORAL_TABLET | Freq: Three times a day (TID) | ORAL | Status: DC
  Administered 2024-08-08 (×2): 0.3 mg via ORAL
  Filled 2024-08-08 (×2): qty 1

## 2024-08-08 MED ORDER — CARVEDILOL 12.5 MG PO TABS
25.0000 mg | ORAL_TABLET | Freq: Two times a day (BID) | ORAL | Status: DC
  Administered 2024-08-08 (×2): 25 mg via ORAL
  Filled 2024-08-08 (×2): qty 2

## 2024-08-08 MED ORDER — TRESIBA FLEXTOUCH 100 UNIT/ML ~~LOC~~ SOPN: 30.0000 [IU] | PEN_INJECTOR | Freq: Every day | SUBCUTANEOUS | Status: AC

## 2024-08-08 MED ORDER — DM-GUAIFENESIN ER 30-600 MG PO TB12: 1.0000 | ORAL_TABLET | Freq: Two times a day (BID) | ORAL | 0 refills | Status: DC

## 2024-08-08 MED ORDER — HEPARIN SODIUM (PORCINE) 1000 UNIT/ML IJ SOLN
INTRAMUSCULAR | Status: AC
  Filled 2024-08-08: qty 3

## 2024-08-08 NOTE — Discharge Summary (Signed)
 Physician Discharge Summary   Patient: Kelly Key MRN: [id number] DOB: 1971/01/06  Admit date:     08/06/2024  Discharge date: 08/08/24  Discharge Physician: Eric Nunnery   PCP: Luke Agent, MD (Inactive)   Recommendations at discharge:  {Tip this will not be part of the note when signed- Example include specific recommendations for outpatient follow-up, pending tests to follow-up on. (Optional):26781}  ***  Discharge Diagnoses: Principal Problem:   Acute hypoxemic respiratory failure (HCC) Active Problems:   ESRD (end stage renal disease) on dialysis (HCC)   Uncontrolled type 2 diabetes mellitus with hyperglycemia (HCC)   OSA (obstructive sleep apnea)   CAP (community acquired pneumonia)   Acute exacerbation of chronic obstructive pulmonary disease (COPD) (HCC)   Morbid obesity with BMI of 50.0-59.9, adult (HCC)  Resolved Problems:   * No resolved hospital problems. [facility] Course: No notes on file  Assessment and Plan: No notes have been filed under this hospital service. Service: Hospitalist     {Tip this will not be part of the note when signed Body mass index is 48.83 kg/m. , ,  (Optional):26781}  {(NOTE) Pain control PDMP Statment (Optional):26782} Consultants: *** Procedures performed: ***  Disposition: {Plan; Disposition:26390} Diet recommendation:  Discharge Diet Orders (From admission, onward)     Start     Ordered   08/08/24 0000  Diet renal/carb modified with fluid restriction        08/08/24 1644           {Diet_Plan:26776} DISCHARGE MEDICATION: Allergies as of 08/08/2024       Reactions   Ozempic  (0.25 Or 0.5 Mg-dose) [semaglutide (0.25 Or 0.5mg -dos)] Shortness Of Breath   Hydralazine  Anxiety, Other (See Comments)   Hallucinations    Lisinopril Swelling        Medication List     STOP taking these medications    ferrous sulfate  325 (65 FE) MG tablet   furosemide  40 MG tablet Commonly known as: LASIX        TAKE these  medications    Accu-Chek Aviva Plus test strip Generic drug: glucose blood Use as instructed TO CHECK BLOOD GLUCOSE THREE TIMES DAILY   acetaminophen  500 MG tablet Commonly known as: TYLENOL  Take 1,000 mg by mouth every 6 (six) hours as needed for moderate pain or headache.   albuterol  108 (90 Base) MCG/ACT inhaler Commonly known as: VENTOLIN  HFA Inhale 2 puffs into the lungs every 4 (four) hours as needed for wheezing or shortness of breath.   allopurinol  100 MG tablet Commonly known as: ZYLOPRIM  Take 2 tablets (200 mg total) by mouth daily.   amLODipine  10 MG tablet Commonly known as: NORVASC  Take 1 tablet (10 mg total) by mouth daily.   amoxicillin -clavulanate 500-125 MG tablet Commonly known as: Augmentin Take 1 tablet by mouth daily for 7 days. Take after HD on dialysis days   aspirin  EC 81 MG tablet Take 1 tablet (81 mg total) by mouth daily.   atorvastatin  80 MG tablet Commonly known as: LIPITOR  Take 1 tablet (80 mg total) by mouth daily.   B-D ULTRAFINE III SHORT PEN 31G X 8 MM Misc Generic drug: Insulin  Pen Needle 1 each by Does not apply route as directed.   carvedilol  25 MG tablet Commonly known as: COREG  Take 1 tablet (25 mg total) by mouth 2 (two) times daily with a meal.   cloNIDine  0.3 MG tablet Commonly known as: CATAPRES  Take 1 tablet (0.3 mg total) by mouth 3 (three) times daily.  clopidogrel  75 MG tablet Commonly known as: PLAVIX  Take 75 mg by mouth daily.   dextromethorphan -guaiFENesin  30-600 MG 12hr tablet Commonly known as: MUCINEX  DM Take 1 tablet by mouth 2 (two) times daily for 7 days.   ezetimibe  10 MG tablet Commonly known as: ZETIA  Take 10 mg by mouth every evening.   fluticasone  50 MCG/ACT nasal spray Commonly known as: FLONASE  Place 1 spray into both nostrils at bedtime as needed for allergies or rhinitis.   gabapentin  100 MG capsule Commonly known as: NEURONTIN  Take 1 capsule (100 mg total) by mouth 3 (three) times  daily.   HumaLOG KwikPen 100 UNIT/ML KwikPen Generic drug: insulin  lispro Inject 8-30 Units into the skin 3 (three) times daily. Sliding scale 30 units max TID   Ipratropium-Albuterol  20-100 MCG/ACT Aers respimat Commonly known as: COMBIVENT  Inhale 1 puff into the lungs every 6 (six) hours as needed for wheezing or shortness of breath.   isosorbide  mononitrate 30 MG 24 hr tablet Commonly known as: IMDUR  Take 0.5 tablets (15 mg total) by mouth daily with supper.   Linzess  145 MCG Caps capsule Generic drug: linaclotide  Take 145 mcg by mouth daily as needed (constipation).   multivitamin Tabs tablet Take 1 tablet by mouth at bedtime.   pantoprazole  40 MG tablet Commonly known as: PROTONIX  Take 1 tablet (40 mg total) by mouth daily. Start taking on: August 09, 2024   predniSONE  20 MG tablet Commonly known as: DELTASONE  Take 2 tabs daily X 1 day; then 1 tablet daily X 3 days and then 1/2 tablet daily X 3 days and stop prednisone . Start taking on: August 09, 2024   spironolactone 25 MG tablet Commonly known as: ALDACTONE Take 1 tablet (25 mg total) by mouth daily. Start taking on: August 09, 2024   [place] FlexTouch 100 UNIT/ML FlexTouch Pen Generic drug: insulin  degludec Inject 30 Units into the skin at bedtime. What changed: how much to take   Velphoro  500 MG chewable tablet Generic drug: sucroferric oxyhydroxide Chew 500 mg by mouth 3 (three) times daily.        Follow-up Information     Luke Agent, MD. Schedule an appointment as soon as possible for a visit in 10 day(s).   Specialty: Internal Medicine Contact information: [address] [place] [address] [phone]                Discharge Exam: Fredricka Weights   08/08/24 0500 08/08/24 1014 08/08/24 1346  Weight: (!) 152.7 kg (!) 153.9 kg (!) 150 kg   ***  Condition at discharge: {DC Condition:26389}  The results of significant diagnostics from this hospitalization  (including imaging, microbiology, ancillary and laboratory) are listed below for reference.   Imaging Studies: DG Chest Portable 1 View Result Date: 08/06/2024 EXAM: 1 VIEW(S) XRAY OF THE CHEST 08/06/2024 05:10:07 AM COMPARISON: 08/04/2024 CLINICAL HISTORY: sob. Pt with c/o SOB, cough, and congestion since being discharged yesterday. Pt scheduled for HD this am but states was feeling too poorly. Pt's O2 sats on R/A were 85%. Placed pt on O2 @ 4L [place] and sats up to 95%. EDP @ bedside. FINDINGS: LUNGS AND PLEURA: Right basilar airspace opacities. Mild pulmonary edema. No pleural effusion. No pneumothorax. HEART AND MEDIASTINUM: No acute abnormality of the cardiac and mediastinal silhouettes. BONES AND SOFT TISSUES: No acute osseous abnormality. IMPRESSION: 1. Right basilar airspace opacities, suspicious for pneumonia or aspiration. 2. Mild pulmonary edema. Electronically signed by: Waddell Calk MD 08/06/2024 05:18 AM EDT RP Workstation:  [id number]   DG Chest 2 View Result Date: 08/04/2024 CLINICAL DATA:  Shortness of breath EXAM: CHEST - 2 VIEW COMPARISON:  08/01/2024 FINDINGS: Cardiac shadow is mildly prominent but accentuated by the frontal technique. Central vascular congestion is noted increased from the prior exam with mild edema. Increasing right basilar atelectasis is noted. IMPRESSION: Worsening CHF with new right basilar atelectasis. Electronically Signed   By: Oneil Devonshire M.D.   On: 08/04/2024 03:36   DG Chest 2 View Result Date: 08/01/2024 CLINICAL DATA:  Shortness of breath for several days, initial encounter EXAM: CHEST - 2 VIEW COMPARISON:  10/27/2023 FINDINGS: Cardiac shadow is enlarged. Mild vascular congestion is noted although improved when compared with the prior exam. No peripheral edema is seen. No focal infiltrate is noted. IMPRESSION: Mild central vascular congestion without edema. Electronically Signed   By: Oneil Devonshire M.D.   On: 08/01/2024 21:05    Microbiology: Results for  orders placed or performed during the hospital encounter of 08/06/24  Resp panel by RT-PCR (RSV, Flu A&B, Covid) Anterior Nasal Swab     Status: None   Collection Time: 08/06/24  5:00 AM   Specimen: Anterior Nasal Swab  Result Value Ref Range Status   SARS Coronavirus 2 by RT PCR NEGATIVE NEGATIVE Final    Comment: (NOTE) SARS-CoV-2 target nucleic acids are NOT DETECTED.  The SARS-CoV-2 RNA is generally detectable in upper respiratory specimens during the acute phase of infection. The lowest concentration of SARS-CoV-2 viral copies this assay can detect is 138 copies/mL. A negative result does not preclude SARS-Cov-2 infection and should not be used as the sole basis for treatment or other patient management decisions. A negative result may occur with  improper specimen collection/handling, submission of specimen other than nasopharyngeal swab, presence of viral mutation(s) within the areas targeted by this assay, and inadequate number of viral copies(<138 copies/mL). A negative result must be combined with clinical observations, patient history, and epidemiological information. The expected result is Negative.  Fact Sheet for Patients:  BloggerCourse.com  Fact Sheet for Healthcare Providers:  SeriousBroker.it  This test is no t yet approved or cleared by the [place]  FDA and  has been authorized for detection and/or diagnosis of SARS-CoV-2 by FDA under an Emergency Use Authorization (EUA). This EUA will remain  in effect (meaning this test can be used) for the duration of the COVID-19 declaration under Section 564(b)(1) of the Act, 21 U.S.C.section 360bbb-3(b)(1), unless the authorization is terminated  or revoked sooner.       Influenza A by PCR NEGATIVE NEGATIVE Final   Influenza B by PCR NEGATIVE NEGATIVE Final    Comment: (NOTE) The Xpert Xpress SARS-CoV-2/FLU/RSV plus assay is intended as an aid in the diagnosis of  influenza from Nasopharyngeal swab specimens and should not be used as a sole basis for treatment. Nasal washings and aspirates are unacceptable for Xpert Xpress SARS-CoV-2/FLU/RSV testing.  Fact Sheet for Patients: BloggerCourse.com  Fact Sheet for Healthcare Providers: SeriousBroker.it  This test is not yet approved or cleared by the [place]  FDA and has been authorized for detection and/or diagnosis of SARS-CoV-2 by FDA under an Emergency Use Authorization (EUA). This EUA will remain in effect (meaning this test can be used) for the duration of the COVID-19 declaration under Section 564(b)(1) of the Act, 21 U.S.C. section 360bbb-3(b)(1), unless the authorization is terminated or revoked.     Resp Syncytial Virus by PCR NEGATIVE NEGATIVE Final    Comment: (NOTE) Fact Sheet for Patients:  BloggerCourse.com  Fact Sheet for Healthcare Providers: SeriousBroker.it  This test is not yet approved or cleared by the [place]  FDA and has been authorized for detection and/or diagnosis of SARS-CoV-2 by FDA under an Emergency Use Authorization (EUA). This EUA will remain in effect (meaning this test can be used) for the duration of the COVID-19 declaration under Section 564(b)(1) of the Act, 21 U.S.C. section 360bbb-3(b)(1), unless the authorization is terminated or revoked.  Performed at [facility], [address] [place], [place] [address]     Labs: CBC: Recent Labs  Lab 08/04/24 0330 08/05/24 0451 08/06/24 0500 08/07/24 1127 08/08/24 0850  WBC 12.4* 10.5 11.6* 11.7* 14.5*  NEUTROABS 9.5*  --  8.5*  --   --   HGB 9.6* 8.9* 9.6* 9.6* 9.8*  HCT 29.4* 27.7* 29.7* 28.6* 29.2*  MCV 91.3 90.5 88.9 87.7 88.0  PLT 228 236 257 271 311   Basic Metabolic Panel: Recent Labs  Lab 08/04/24 0330 08/04/24 2129 08/05/24 0451 08/06/24 0500 08/07/24 1127 08/07/24 2130  08/08/24 0850  NA 131*  --  132* 133* 128*  --  127*  K 4.9  --  4.7 4.3 4.8  --  5.0  CL 90*  --  89* 90* 85*  --  83*  CO2 26  --  28 26 24   --  19*  GLUCOSE 466*   < > 435* 232* 499* 434* 503*  BUN 78*  --  74* 94* 104*  --  124*  CREATININE 10.60*  --  9.28* 10.70* 10.60*  --  11.60*  CALCIUM  9.4  --  8.5* 8.7* 8.3*  --  7.7*  MG  --   --   --   --  2.8*  --   --   PHOS  --   --   --   --  5.7*  --  6.9*   < > = values in this interval not displayed.   Liver Function Tests: Recent Labs  Lab 08/08/24 0850  ALBUMIN  4.1   CBG: Recent Labs  Lab 08/07/24 2351 08/08/24 0619 08/08/24 0732 08/08/24 1138 08/08/24 1625  GLUCAP >600* 480* 493* 345* 433*    Discharge time spent: {LESS THAN/GREATER UYJW:73611} 30 minutes.  Signed: Eric Nunnery, MD [facility] 08/08/2024

## 2024-08-08 NOTE — Progress Notes (Signed)
 Pt CBG's have been elevated in the 400-500's all night, MD adjusted Lantus  dose, BP's elevated ranging from 220-190's systolic, prn Labetalol  administered with no change, MD aware. Pt is scheduled for Dialysis today, maybe he could be moved to first treatment today if possible.

## 2024-08-08 NOTE — Plan of Care (Signed)
   Problem: Education: Goal: Knowledge of General Education information will improve Description: Including pain rating scale, medication(s)/side effects and non-pharmacologic comfort measures Outcome: Progressing   Problem: Clinical Measurements: Goal: Will remain free from infection Outcome: Progressing Goal: Diagnostic test results will improve Outcome: Progressing Goal: Respiratory complications will improve Outcome: Progressing

## 2024-08-08 NOTE — Progress Notes (Signed)
 Patient has discharge orders, discharge instructions given and no further questions at this time, patient will be transported by staff via w/c to vehicle accompanied by wife, awaiting arrival.

## 2024-08-08 NOTE — Plan of Care (Signed)
  Problem: Education: Goal: Knowledge of General Education information will improve Description: Including pain rating scale, medication(s)/side effects and non-pharmacologic comfort measures Outcome: Progressing   Problem: Health Behavior/Discharge Planning: Goal: Ability to manage health-related needs will improve Outcome: Progressing   Problem: Clinical Measurements: Goal: Will remain free from infection Outcome: Progressing Goal: Diagnostic test results will improve Outcome: Progressing Goal: Respiratory complications will improve Outcome: Progressing Goal: Cardiovascular complication will be avoided Outcome: Progressing   Problem: Activity: Goal: Risk for activity intolerance will decrease Outcome: Progressing   Problem: Nutrition: Goal: Adequate nutrition will be maintained Outcome: Progressing   Problem: Coping: Goal: Level of anxiety will decrease Outcome: Progressing   Problem: Elimination: Goal: Will not experience complications related to bowel motility Outcome: Progressing   Problem: Education: Goal: Ability to describe self-care measures that may prevent or decrease complications (Diabetes Survival Skills Education) will improve Outcome: Progressing Goal: Individualized Educational Video(s) Outcome: Progressing   Problem: Coping: Goal: Ability to adjust to condition or change in health will improve Outcome: Progressing   Problem: Fluid Volume: Goal: Ability to maintain a balanced intake and output will improve Outcome: Progressing   Problem: Health Behavior/Discharge Planning: Goal: Ability to identify and utilize available resources and services will improve Outcome: Progressing Goal: Ability to manage health-related needs will improve Outcome: Progressing   Problem: Metabolic: Goal: Ability to maintain appropriate glucose levels will improve Outcome: Progressing   Problem: Skin Integrity: Goal: Risk for impaired skin integrity will  decrease Outcome: Progressing   Problem: Tissue Perfusion: Goal: Adequacy of tissue perfusion will improve Outcome: Progressing   Problem: Activity: Goal: Ability to tolerate increased activity will improve Outcome: Progressing

## 2024-08-08 NOTE — Progress Notes (Signed)
 Pt refusing lab draws and pt request labs to be drawn during Dialysis today. 10/13

## 2024-08-08 NOTE — Progress Notes (Signed)
 Went in to see if patient was ready to go on his CPAP machine.  Patient was sitting on end of bed.  He stated that he was not ready to go on yet due to coughing up secretions.

## 2024-08-08 NOTE — Progress Notes (Signed)
 Admit: 08/06/2024 LOS: 2  34M ESRD THS [facility] LUE AVF with AHRF, acute dyspnea, hypertensive emergency   Subjective:  Readmitted 10/11 after discharge 10/10 with recurrent respiratory distress.  On 10/11 had dialysis with 4.5 L fluid removed. Blood pressures remain persistently elevated on amlodipine , carvedilol , clonidine . Has a history of swelling to ACE inhibitors For dialysis today, extra treatment  10/12 0701 - 10/13 0700 In: 830 [P.O.:480; IV Piggyback:350] Out: -   Filed Weights   08/06/24 1421 08/07/24 0404 08/08/24 0500  Weight: (!) 150 kg (!) 151.7 kg (!) 152.7 kg    Scheduled Meds:  amLODipine   10 mg Oral Daily   carvedilol   25 mg Oral BID WC   Chlorhexidine  Gluconate Cloth  6 each Topical Q0600   cloNIDine   0.3 mg Oral TID   dextromethorphan -guaiFENesin   1 tablet Oral BID   heparin   5,000 Units Subcutaneous Q8H   insulin  aspart  0-15 Units Subcutaneous TID WC   insulin  aspart  0-5 Units Subcutaneous QHS   insulin  glargine  20 Units Subcutaneous BID   ipratropium-albuterol   3 mL Nebulization Q6H   pantoprazole   40 mg Oral Daily   predniSONE   40 mg Oral Q breakfast   spironolactone  25 mg Oral Daily   Continuous Infusions:  anticoagulant sodium citrate     azithromycin 500 mg (08/08/24 0547)   cefTRIAXone  (ROCEPHIN )  IV 1 g (08/08/24 0427)   PRN Meds:.acetaminophen  **OR** acetaminophen , alteplase , anticoagulant sodium citrate, heparin , heparin , heparin , labetalol , lidocaine  (PF), lidocaine -prilocaine , ondansetron  **OR** ondansetron  (ZOFRAN ) IV, pentafluoroprop-tetrafluoroeth  Current Labs: reviewed  RFP reviewed, K4.7, BUN 74 CBC reviewed, hemoglobin 8.9  Physical Exam:  Blood pressure (!) 198/112, pulse 99, temperature 98.6 F (37 C), temperature source Oral, resp. rate 20, height 5' 9 (1.753 m), weight (!) 152.7 kg, SpO2 93%. Obese, NAD, comfortable appearing, normal work of breathing Regular, normal S1 and S2 Left upper arm AV fistula with bruit  and thrill, some aneurysmal disease, unchanged Lungs clear throughout Trace lower extremity edema  A Recurrent AHRF: Chest x-ray with findings of pulmonary congestion and acute on CHF.  Also some features perhaps of pneumonia.  Volume overload and uncontrolled hypertension likely dominant cause.   Acute on Chronic HFpEF: Recurrent, needs additional dialysis ESRD on HD THS [facility], LUE AVF Hypertension with emergency: As per # 1, 2, 3.  Continue outpatient blood pressure medications, ongoing volume unloading with this can be done as an outpatient. 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 Morphine  should not be used.  Baclofen should be avoided Avoid oral sodium phosphate  and magnesium  citrate based laxatives / bowel preps   Kelly Key 08/08/2024, 9:01 AM  Recent Labs  Lab 08/05/24 0451 08/06/24 0500 08/07/24 1127 08/07/24 2130  NA 132* 133* 128*  --   K 4.7 4.3 4.8  --   CL 89* 90* 85*  --   CO2 28 26 24   --   GLUCOSE 435* 232* 499* 434*  BUN 74* 94* 104*  --   CREATININE  9.28* 10.70* 10.60*  --   CALCIUM  8.5* 8.7* 8.3*  --   PHOS  --   --  5.7*  --    Recent Labs  Lab 08/04/24 0330 08/05/24 0451 08/06/24 0500 08/07/24 1127  WBC 12.4* 10.5 11.6* 11.7*  NEUTROABS 9.5*  --  8.5*  --   HGB 9.6* 8.9* 9.6* 9.6*  HCT 29.4* 27.7* 29.7* 28.6*  MCV 91.3 90.5 88.9 87.7  PLT 228 236 257 271

## 2024-08-08 NOTE — Progress Notes (Signed)
 Heart Failure Navigator Progress Note  Assessed for Heart & Vascular TOC clinic readiness.  Patient does not meet criteria due to ESRD on Hemodialysis.   Navigator will sign off at this time.  Roxy Horseman, RN, BSN [facility] Heart Failure Navigator Secure Chat Only

## 2024-08-08 NOTE — Care Management Important Message (Signed)
 Important Message  Patient Details  Name: Kelly Key MRN: [id number] Date of Birth: [birth year]   Important Message Given:  N/A - LOS <3 / Initial given by admissions     Duwaine LITTIE Ada 08/08/2024, 11:25 AM

## 2024-08-08 NOTE — Procedures (Signed)
 Received patient in bed to unit.  Alert and oriented.  Informed consent signed and in chart.  LUA AVF cannulated x 2 with 15g needles per policy, without difficulty. Secured well with tape. UF goal 4000 ml.   TX duration:3 hours  Tx complete. Blood returned. Needles removed, sites held x 2 until hemostasis achieved. Gauze changed prior to taping.   Patient tolerated well.  Transported back to the room  Alert, without acute distress.  Hand-off given to patient's nurse.   Access used: LUA AVF Access issues: none  Total UF removed: 4000 ml Medication(s) given: See MAR   [facility]

## 2024-08-09 LAB — CULTURE, BLOOD (ROUTINE X 2)
Culture: NO GROWTH
Culture: NO GROWTH
Special Requests: ADEQUATE

## 2024-08-10 ENCOUNTER — Telehealth: Payer: Self-pay | Admitting: Cardiology

## 2024-08-10 NOTE — Telephone Encounter (Signed)
 STOP taking these medications     ferrous sulfate  325 (65 FE) MG tablet    furosemide  40 MG tablet Commonly known as: LASIX       - Volume management will be pursuit with hemodialysis; anticipating next treatment 08/09/2024 in order to get back on outpatient schedule.    Spoke to patients fiancee and advised that furosemide  was d/c'd after leaving hospital. Pt's fiancee verbalized understanding and had no further questions at this time.

## 2024-08-10 NOTE — Telephone Encounter (Signed)
 Pt c/o medication issue:  1. Name of Medication: furosemide  (LASIX ) 100 mg in dextrose  5 % 50 mL IVPB   [[id number]]   2. How are you currently taking this medication (dosage and times per day)? Not sure   3. Are you having a reaction (difficulty breathing--STAT)? No   4. What is your medication issue? This medication was not on the list when pt left hospital. Pt is not sure if he should continue the medication or not. Please advise.

## 2024-08-15 ENCOUNTER — Emergency Department

## 2024-08-15 ENCOUNTER — Observation Stay
Admission: EM | Admit: 2024-08-15 | Discharge: 2024-08-16 | Disposition: A | Discharge status: Home / Self Care | Attending: Emergency Medicine | Admitting: Emergency Medicine

## 2024-08-15 ENCOUNTER — Encounter: Payer: Self-pay | Admitting: Emergency Medicine

## 2024-08-15 ENCOUNTER — Other Ambulatory Visit: Payer: Self-pay

## 2024-08-15 DIAGNOSIS — Z79899 Other long term (current) drug therapy: Secondary | ICD-10-CM | POA: Diagnosis not present

## 2024-08-15 DIAGNOSIS — Z794 Long term (current) use of insulin: Secondary | ICD-10-CM | POA: Insufficient documentation

## 2024-08-15 DIAGNOSIS — Z7951 Long term (current) use of inhaled steroids: Secondary | ICD-10-CM | POA: Insufficient documentation

## 2024-08-15 DIAGNOSIS — E66813 Obesity, class 3: Secondary | ICD-10-CM | POA: Diagnosis not present

## 2024-08-15 DIAGNOSIS — J449 Chronic obstructive pulmonary disease, unspecified: Secondary | ICD-10-CM | POA: Diagnosis not present

## 2024-08-15 DIAGNOSIS — Z7982 Long term (current) use of aspirin: Secondary | ICD-10-CM | POA: Insufficient documentation

## 2024-08-15 DIAGNOSIS — N186 End stage renal disease: Secondary | ICD-10-CM | POA: Insufficient documentation

## 2024-08-15 DIAGNOSIS — I739 Peripheral vascular disease, unspecified: Secondary | ICD-10-CM | POA: Diagnosis not present

## 2024-08-15 DIAGNOSIS — J810 Acute pulmonary edema: Secondary | ICD-10-CM | POA: Diagnosis not present

## 2024-08-15 DIAGNOSIS — I5033 Acute on chronic diastolic (congestive) heart failure: Secondary | ICD-10-CM | POA: Insufficient documentation

## 2024-08-15 DIAGNOSIS — I2510 Atherosclerotic heart disease of native coronary artery without angina pectoris: Secondary | ICD-10-CM | POA: Insufficient documentation

## 2024-08-15 DIAGNOSIS — E1165 Type 2 diabetes mellitus with hyperglycemia: Secondary | ICD-10-CM | POA: Insufficient documentation

## 2024-08-15 DIAGNOSIS — Z992 Dependence on renal dialysis: Secondary | ICD-10-CM | POA: Diagnosis not present

## 2024-08-15 DIAGNOSIS — E871 Hypo-osmolality and hyponatremia: Secondary | ICD-10-CM | POA: Insufficient documentation

## 2024-08-15 DIAGNOSIS — E8770 Fluid overload, unspecified: Secondary | ICD-10-CM | POA: Diagnosis not present

## 2024-08-15 DIAGNOSIS — D631 Anemia in chronic kidney disease: Secondary | ICD-10-CM | POA: Insufficient documentation

## 2024-08-15 DIAGNOSIS — R0600 Dyspnea, unspecified: Secondary | ICD-10-CM | POA: Diagnosis present

## 2024-08-15 DIAGNOSIS — E872 Acidosis, unspecified: Secondary | ICD-10-CM | POA: Insufficient documentation

## 2024-08-15 DIAGNOSIS — I509 Heart failure, unspecified: Secondary | ICD-10-CM

## 2024-08-15 LAB — BASIC METABOLIC PANEL WITH GFR
Anion gap: 19 — ABNORMAL HIGH (ref 5–15)
BUN: 88 mg/dL — ABNORMAL HIGH (ref 6–20)
CO2: 23 mmol/L (ref 22–32)
Calcium: 7.8 mg/dL — ABNORMAL LOW (ref 8.9–10.3)
Chloride: 90 mmol/L — ABNORMAL LOW (ref 98–111)
Creatinine, Ser: 9.44 mg/dL — ABNORMAL HIGH (ref 0.61–1.24)
GFR, Estimated: 6 mL/min — ABNORMAL LOW (ref >60)
Glucose, Bld: 426 mg/dL — ABNORMAL HIGH (ref 70–99)
Potassium: 4.5 mmol/L (ref 3.5–5.1)
Sodium: 132 mmol/L — ABNORMAL LOW (ref 135–145)

## 2024-08-15 LAB — CBC
HCT: 25.8 % — ABNORMAL LOW (ref 39.0–52.0)
Hemoglobin: 8.3 g/dL — ABNORMAL LOW (ref 13.0–17.0)
MCH: 29.5 pg (ref 26.0–34.0)
MCHC: 32.2 g/dL (ref 30.0–36.0)
MCV: 91.8 fL (ref 80.0–100.0)
Platelets: 229 K/uL (ref 150–400)
RBC: 2.81 MIL/uL — ABNORMAL LOW (ref 4.22–5.81)
RDW: 14.7 % (ref 11.5–15.5)
WBC: 9.5 K/uL (ref 4.0–10.5)
nRBC: 0 % (ref 0.0–0.2)

## 2024-08-15 LAB — GLUCOSE, CAPILLARY
Glucose-Capillary: 186 mg/dL — ABNORMAL HIGH (ref 70–99)
Glucose-Capillary: 385 mg/dL — ABNORMAL HIGH (ref 70–99)
Glucose-Capillary: 411 mg/dL — ABNORMAL HIGH (ref 70–99)

## 2024-08-15 LAB — TROPONIN I (HIGH SENSITIVITY): Troponin I (High Sensitivity): 32 ng/L — ABNORMAL HIGH (ref <18)

## 2024-08-15 MED ORDER — ALLOPURINOL 100 MG PO TABS
200.0000 mg | ORAL_TABLET | Freq: Every day | ORAL | Status: DC
  Administered 2024-08-15 – 2024-08-16 (×2): 200 mg via ORAL
  Filled 2024-08-15 (×2): qty 2

## 2024-08-15 MED ORDER — UMECLIDINIUM-VILANTEROL 62.5-25 MCG/ACT IN AEPB
1.0000 | INHALATION_SPRAY | Freq: Every day | RESPIRATORY_TRACT | Status: DC
  Administered 2024-08-15: 1 via RESPIRATORY_TRACT
  Filled 2024-08-15: qty 14

## 2024-08-15 MED ORDER — ALTEPLASE 2 MG IJ SOLR: 2.0000 mg | Freq: Once | INTRAMUSCULAR | Status: DC | PRN

## 2024-08-15 MED ORDER — HEPARIN SODIUM (PORCINE) 1000 UNIT/ML DIALYSIS: 1000.0000 [IU] | INTRAMUSCULAR | Status: DC | PRN

## 2024-08-15 MED ORDER — INSULIN GLARGINE-YFGN 100 UNIT/ML ~~LOC~~ SOLN
30.0000 [IU] | Freq: Every day | SUBCUTANEOUS | Status: DC
  Administered 2024-08-15: 30 [IU] via SUBCUTANEOUS
  Filled 2024-08-15 (×2): qty 0.3

## 2024-08-15 MED ORDER — EZETIMIBE 10 MG PO TABS
10.0000 mg | ORAL_TABLET | Freq: Every evening | ORAL | Status: DC
Start: 2024-08-15 — End: 2024-08-16
  Administered 2024-08-15: 10 mg via ORAL
  Filled 2024-08-15 (×2): qty 1

## 2024-08-15 MED ORDER — SODIUM CHLORIDE 0.9% FLUSH
3.0000 mL | Freq: Two times a day (BID) | INTRAVENOUS | Status: DC
  Administered 2024-08-15 – 2024-08-16 (×3): 3 mL via INTRAVENOUS

## 2024-08-15 MED ORDER — ACETAMINOPHEN 500 MG PO TABS: 1000.0000 mg | ORAL_TABLET | Freq: Four times a day (QID) | ORAL | Status: DC | PRN

## 2024-08-15 MED ORDER — SPIRONOLACTONE 25 MG PO TABS
25.0000 mg | ORAL_TABLET | Freq: Every day | ORAL | Status: DC
  Administered 2024-08-15 – 2024-08-16 (×2): 25 mg via ORAL
  Filled 2024-08-15 (×2): qty 1

## 2024-08-15 MED ORDER — ISOSORBIDE MONONITRATE ER 30 MG PO TB24
30.0000 mg | ORAL_TABLET | Freq: Every day | ORAL | Status: DC
  Administered 2024-08-15: 30 mg via ORAL
  Filled 2024-08-15: qty 1

## 2024-08-15 MED ORDER — FUROSEMIDE 40 MG PO TABS
80.0000 mg | ORAL_TABLET | Freq: Every day | ORAL | Status: DC
  Administered 2024-08-16: 80 mg via ORAL
  Filled 2024-08-15: qty 2

## 2024-08-15 MED ORDER — AMLODIPINE BESYLATE 10 MG PO TABS
10.0000 mg | ORAL_TABLET | Freq: Every day | ORAL | Status: DC
  Administered 2024-08-15 – 2024-08-16 (×2): 10 mg via ORAL
  Filled 2024-08-15 (×2): qty 1

## 2024-08-15 MED ORDER — LIDOCAINE-PRILOCAINE 2.5-2.5 % EX CREA: 1.0000 | TOPICAL_CREAM | CUTANEOUS | Status: DC | PRN

## 2024-08-15 MED ORDER — CHLORHEXIDINE GLUCONATE CLOTH 2 % EX PADS
6.0000 | MEDICATED_PAD | Freq: Every day | CUTANEOUS | Status: DC
  Administered 2024-08-15 – 2024-08-16 (×2): 6 via TOPICAL
  Filled 2024-08-15: qty 6

## 2024-08-15 MED ORDER — NITROGLYCERIN 2 % TD OINT
1.0000 [in_us] | TOPICAL_OINTMENT | Freq: Once | TRANSDERMAL | Status: AC
  Administered 2024-08-15: 1 [in_us] via TOPICAL
  Filled 2024-08-15: qty 1

## 2024-08-15 MED ORDER — GABAPENTIN 100 MG PO CAPS
100.0000 mg | ORAL_CAPSULE | Freq: Three times a day (TID) | ORAL | Status: DC
  Administered 2024-08-15 – 2024-08-16 (×3): 100 mg via ORAL
  Filled 2024-08-15 (×3): qty 1

## 2024-08-15 MED ORDER — SODIUM CHLORIDE 0.9 % IV SOLN: 250.0000 mL | INTRAVENOUS | Status: AC | PRN

## 2024-08-15 MED ORDER — CARVEDILOL 25 MG PO TABS
25.0000 mg | ORAL_TABLET | Freq: Two times a day (BID) | ORAL | Status: DC
  Administered 2024-08-15: 25 mg via ORAL
  Filled 2024-08-15: qty 1

## 2024-08-15 MED ORDER — FLUTICASONE PROPIONATE 50 MCG/ACT NA SUSP: 1.0000 | Freq: Every evening | NASAL | Status: DC | PRN

## 2024-08-15 MED ORDER — ATORVASTATIN CALCIUM 20 MG PO TABS
80.0000 mg | ORAL_TABLET | Freq: Every day | ORAL | Status: DC
  Administered 2024-08-15 – 2024-08-16 (×2): 80 mg via ORAL
  Filled 2024-08-15 (×2): qty 4

## 2024-08-15 MED ORDER — INSULIN ASPART 100 UNIT/ML IJ SOLN
0.0000 [IU] | Freq: Every day | INTRAMUSCULAR | Status: DC
  Administered 2024-08-15: 4 [IU] via SUBCUTANEOUS
  Filled 2024-08-15: qty 1

## 2024-08-15 MED ORDER — PENTAFLUOROPROP-TETRAFLUOROETH EX AERO: 1.0000 | INHALATION_SPRAY | CUTANEOUS | Status: DC | PRN

## 2024-08-15 MED ORDER — LINACLOTIDE 145 MCG PO CAPS
145.0000 ug | ORAL_CAPSULE | Freq: Every day | ORAL | Status: DC | PRN
  Administered 2024-08-16: 145 ug via ORAL
  Filled 2024-08-15 (×2): qty 1

## 2024-08-15 MED ORDER — ALBUTEROL SULFATE (2.5 MG/3ML) 0.083% IN NEBU: 2.5000 mg | INHALATION_SOLUTION | RESPIRATORY_TRACT | Status: DC | PRN

## 2024-08-15 MED ORDER — IPRATROPIUM-ALBUTEROL 0.5-2.5 (3) MG/3ML IN SOLN
3.0000 mL | Freq: Once | RESPIRATORY_TRACT | Status: AC
  Administered 2024-08-15: 3 mL via RESPIRATORY_TRACT
  Filled 2024-08-15: qty 3

## 2024-08-15 MED ORDER — PANTOPRAZOLE SODIUM 40 MG PO TBEC
40.0000 mg | DELAYED_RELEASE_TABLET | Freq: Every day | ORAL | Status: DC
  Administered 2024-08-15 – 2024-08-16 (×2): 40 mg via ORAL
  Filled 2024-08-15 (×2): qty 1

## 2024-08-15 MED ORDER — INSULIN ASPART 100 UNIT/ML IJ SOLN
0.0000 [IU] | Freq: Three times a day (TID) | INTRAMUSCULAR | Status: DC
  Administered 2024-08-15: 20 [IU] via SUBCUTANEOUS
  Administered 2024-08-16: 3 [IU] via SUBCUTANEOUS
  Filled 2024-08-15 (×2): qty 1

## 2024-08-15 MED ORDER — LINAGLIPTIN 5 MG PO TABS
5.0000 mg | ORAL_TABLET | Freq: Every day | ORAL | Status: DC
Start: 2024-08-15 — End: 2024-08-16
  Administered 2024-08-15 – 2024-08-16 (×2): 5 mg via ORAL
  Filled 2024-08-15 (×2): qty 1

## 2024-08-15 MED ORDER — SODIUM CHLORIDE 0.9% FLUSH: 3.0000 mL | INTRAVENOUS | Status: DC | PRN

## 2024-08-15 MED ORDER — CLONIDINE HCL 0.1 MG PO TABS
0.3000 mg | ORAL_TABLET | Freq: Three times a day (TID) | ORAL | Status: DC
  Administered 2024-08-15 – 2024-08-16 (×3): 0.3 mg via ORAL
  Filled 2024-08-15 (×3): qty 3

## 2024-08-15 MED ORDER — ASPIRIN 81 MG PO TBEC
81.0000 mg | DELAYED_RELEASE_TABLET | Freq: Every day | ORAL | Status: DC
  Administered 2024-08-15 – 2024-08-16 (×2): 81 mg via ORAL
  Filled 2024-08-15 (×2): qty 1

## 2024-08-15 MED ORDER — HEPARIN SODIUM (PORCINE) 5000 UNIT/ML IJ SOLN
5000.0000 [IU] | Freq: Two times a day (BID) | INTRAMUSCULAR | Status: DC
  Administered 2024-08-15 – 2024-08-16 (×3): 5000 [IU] via SUBCUTANEOUS
  Filled 2024-08-15 (×3): qty 1

## 2024-08-15 MED ORDER — SUCROFERRIC OXYHYDROXIDE 500 MG PO CHEW
500.0000 mg | CHEWABLE_TABLET | Freq: Three times a day (TID) | ORAL | Status: DC
Start: 2024-08-15 — End: 2024-08-16
  Administered 2024-08-15 – 2024-08-16 (×3): 500 mg via ORAL
  Filled 2024-08-15 (×4): qty 1

## 2024-08-15 MED ORDER — CLOPIDOGREL BISULFATE 75 MG PO TABS
75.0000 mg | ORAL_TABLET | Freq: Every day | ORAL | Status: DC
  Administered 2024-08-15 – 2024-08-16 (×2): 75 mg via ORAL
  Filled 2024-08-15 (×2): qty 1

## 2024-08-15 MED ORDER — ONDANSETRON HCL 4 MG/2ML IJ SOLN: 4.0000 mg | Freq: Four times a day (QID) | INTRAMUSCULAR | Status: DC | PRN

## 2024-08-15 NOTE — Progress Notes (Signed)
 Heart Failure Navigator Progress Note  Assessed for Heart & Vascular TOC clinic readiness.  Patient does not meet criteria due to ESRD on Hemodialysis.   Navigator will sign off at this time.  Roxy Horseman, RN, BSN [facility] Heart Failure Navigator Secure Chat Only

## 2024-08-15 NOTE — ED Notes (Signed)
 Pt stated the nitroglycerin  paste has brought some relief. Pt appears to be a little more comfortable. Wife is at bedside.

## 2024-08-15 NOTE — ED Triage Notes (Signed)
 Pt arrives POV via WC c/o SOB. Pt has dialysis T, Th, Sat; last treatment completed Saturday and was told he would probably need another treatment Monday due to fluid. Pt has been SOB x couple days. 5/10 CP described as tightness. NADN.

## 2024-08-15 NOTE — Progress Notes (Signed)
 Pt receives out-pt HD at [facility] on TTS 1st shift. Will assist as needed.   Randine Mungo Dialysis Navigator (coverage) [phone]

## 2024-08-15 NOTE — ED Notes (Signed)
 Pt transported to xray

## 2024-08-15 NOTE — ED Provider Notes (Signed)
 [facility] Provider Note    Event Date/Time   First MD Initiated Contact with Patient 08/15/24 [phone]     (approximate)  History   Chief Complaint: Shortness of Breath  HPI  Kelly Key is a 53 y.o. male with a past medical history of ESRD on HD Tuesday/Thursday/Saturday, diabetes, CHF, CAD, hypertension, presents to the emergency department for worsening shortness of breath.  According to the patient he last had dialysis on Saturday, states he would likely need another treatment on Monday due to significant fluid overload.  Patient states he was trying to wait until Monday however he has been experiencing worsening shortness of breath over the weekend.  States some chest tightness but denies any pain.  States he cannot lie down due to the shortness of breath.  Has noted some increased swelling in his legs as well.  Physical Exam   Triage Vital Signs: ED Triage Vitals  Encounter Vitals Group     BP 08/15/24 0309 (!) 172/87     Girls Systolic BP Percentile --      Girls Diastolic BP Percentile --      Boys Systolic BP Percentile --      Boys Diastolic BP Percentile --      Pulse Rate 08/15/24 0309 76     Resp 08/15/24 0309 (!) 22     Temp 08/15/24 0309 98 F (36.7 C)     Temp Source 08/15/24 0309 Oral     SpO2 08/15/24 0309 100 %     Weight 08/15/24 0310 (!) 340 lb (154.2 kg)     Height --      Head Circumference --      Peak Flow --      Pain Score 08/15/24 0309 5     Pain Loc --      Pain Education --      Exclude from Growth Chart --     Most recent vital signs: Vitals:   08/15/24 0309  BP: (!) 172/87  Pulse: 76  Resp: (!) 22  Temp: 98 F (36.7 C)  SpO2: 100%    General: Awake, no distress.  CV:  Good peripheral perfusion.  Regular rate and rhythm  Resp:  Normal effort.  Equal breath sounds bilaterally.  Mild expiratory wheeze. Abd:  No distention.  Soft, nontender.  No rebound or guarding.  ED Results / Procedures / Treatments    EKG  EKG viewed and interpreted by myself shows a normal sinus rhythm at 72 bpm with a narrow QRS, normal axis, normal intervals, no concerning ST changes.  RADIOLOGY  I have reviewed interpreted chest x-ray images.  Patient does appear to have mild haziness bilaterally possibly consistent with edema. Radiology confirms likely edema  MEDICATIONS ORDERED IN ED: Medications  ipratropium-albuterol  (DUONEB) 0.5-2.5 (3) MG/3ML nebulizer solution 3 mL (has no administration in time range)     IMPRESSION / MDM / ASSESSMENT AND PLAN / ED COURSE  I reviewed the triage vital signs and the nursing notes.  Patient's presentation is most consistent with acute presentation with potential threat to life or bodily function.  Patient presents emergency department for worsening shortness of breath.  Patient is end-stage renal on hemodialysis Tuesday/Thursday/Saturday.  Was told he would likely need an extra session on Monday due to fluid overload.  Patient states that shortness of breath has worsened over the weekend and he is unable to lie flat or really walk/exert himself due to shortness of breath.  Patient  does have 2+ lower extremity edema which he states has increased as well.  Mild expiratory wheeze.  We will check labs we will obtain a chest x-ray we will dose a breathing treatment while awaiting x-ray and lab results.  Patient agreeable to plan of care.  Patient's lab work has resulted showing elevated BUN but reassuring potassium.  Patient CBC shows no significant findings, chronic anemia.  Slight troponin elevation which is not unexpected in end-stage renal patient.  Chest x-ray consistent with edema.  Given the patient's signs of fluid overload clinically with chest x-ray consistent with pulmonary edema, patient with orthopnea and does not believe he can wait another day for dialysis.  I spoke to nephrology they will arrange for dialysis today.  Will admit to the hospitalist service for further  workup and treatment.  FINAL CLINICAL IMPRESSION(S) / ED DIAGNOSES   Dyspnea Fluid overload  Note:  This document was prepared using Dragon voice recognition software and may include unintentional dictation errors.   Dorothyann Drivers, MD 08/15/24 [phone]

## 2024-08-15 NOTE — Progress Notes (Signed)
 [facility]  ROUNDING NOTE   Subjective:   Kelly Key is a 53 year old male with past medical conditions including hypertension, diabetes, COPD, PAD, and end-stage renal disease on hemodialysis.  Patient presents to the emergency department complaining of shortness of breath and has been admitted for Acute pulmonary edema (HCC) [J81.0] Hypervolemia, unspecified hypervolemia type [E87.70] CHF (congestive heart failure) (HCC) [I50.9]  Patient is known to our practice and receives outpatient dialysis treatments at [facility] on a TTS schedule, supervised by Dr. Marcelino.  Patient states he did go to dialysis on Saturday.  Patient states he arrived 11 kg over his dry weight.  They were able to remove 5 L during that treatment.  Patient was supposed to receive additional treatment today however presented to hospital for increased shortness of breath.  Patient states he frequently gains much fluid between his treatments.  States he only drinks a cup or 2 of liquids during the day, tea and water.  Patient is seen and evaluated during dialysis   HEMODIALYSIS FLOWSHEET:  Blood Flow Rate (mL/min): 320 mL/min Arterial Pressure (mmHg): 180 mmHg Venous Pressure (mmHg): 120 mmHg TMP (mmHg): 30 mmHg Ultrafiltration Rate (mL/min): 1272 mL/min Dialysate Flow Rate (mL/min): 300 ml/min Dialysis Fluid Bolus: Normal Saline   Labs on ED arrival concerning for sodium 132, BUN 88, creatinine 9.44 with GFR 6, troponin 32, and hemoglobin 8.3.  Glucose 426.  Chest x-ray shows pulmonary congestion/edema, consistent with CHF.  We have been consulted to manage dialysis needs during this admission.   Objective:  Vital signs in last 24 hours:  Temp:  [97.2 F (36.2 C)-98 F (36.7 C)] 97.7 F (36.5 C) (10/20 1344) Pulse Rate:  [63-80] 69 (10/20 1344) Resp:  [11-22] 16 (10/20 1344) BP: (135-199)/(76-111) 177/89 (10/20 1344) SpO2:  [96 %-100 %] 100 % (10/20 1344) Weight:  [154.2 kg-162.3 kg] 158.2  kg (10/20 1300)  Weight change:  Filed Weights   08/15/24 0310 08/15/24 0809 08/15/24 1300  Weight: (!) 154.2 kg (!) 162.3 kg (!) 158.2 kg    Intake/Output: No intake/output data recorded.   Intake/Output this shift:  Total I/O In: -  Out: 3500 [Other:3500]  Physical Exam: General: NAD  Head: Normocephalic, atraumatic. Moist oral mucosal membranes  Eyes: Anicteric  Neck: Supple  Lungs:  Basilar rales, mild tachypnea  Heart: Regular rate and rhythm  Abdomen:  Soft, nontender  Extremities: 2-3+ peripheral edema.  Neurologic: Awake, alert, conversant  Skin: Warm,dry, no rash  Access: Left upper aVF    Basic Metabolic Panel: Recent Labs  Lab 08/15/24 0312  NA 132*  K 4.5  CL 90*  CO2 23  GLUCOSE 426*  BUN 88*  CREATININE 9.44*  CALCIUM  7.8*    Liver Function Tests: No results for input(s): AST, ALT, ALKPHOS, BILITOT, PROT, ALBUMIN  in the last 168 hours. No results for input(s): LIPASE, AMYLASE in the last 168 hours. No results for input(s): AMMONIA in the last 168 hours.  CBC: Recent Labs  Lab 08/15/24 0312  WBC 9.5  HGB 8.3*  HCT 25.8*  MCV 91.8  PLT 229    Cardiac Enzymes: No results for input(s): CKTOTAL, CKMB, CKMBINDEX, TROPONINI in the last 168 hours.  BNP: Invalid input(s): POCBNP  CBG: Recent Labs  Lab 08/08/24 1625 08/15/24 1350  GLUCAP 433* 186*    Microbiology: Results for orders placed or performed during the hospital encounter of 08/06/24  Resp panel by RT-PCR (RSV, Flu A&B, Covid) Anterior Nasal Swab     Status:  None   Collection Time: 08/06/24  5:00 AM   Specimen: Anterior Nasal Swab  Result Value Ref Range Status   SARS Coronavirus 2 by RT PCR NEGATIVE NEGATIVE Final    Comment: (NOTE) SARS-CoV-2 target nucleic acids are NOT DETECTED.  The SARS-CoV-2 RNA is generally detectable in upper respiratory specimens during the acute phase of infection. The lowest concentration of SARS-CoV-2 viral  copies this assay can detect is 138 copies/mL. A negative result does not preclude SARS-Cov-2 infection and should not be used as the sole basis for treatment or other patient management decisions. A negative result may occur with  improper specimen collection/handling, submission of specimen other than nasopharyngeal swab, presence of viral mutation(s) within the areas targeted by this assay, and inadequate number of viral copies(<138 copies/mL). A negative result must be combined with clinical observations, patient history, and epidemiological information. The expected result is Negative.  Fact Sheet for Patients:  BloggerCourse.com  Fact Sheet for Healthcare Providers:  SeriousBroker.it  This test is no t yet approved or cleared by the [place]  FDA and  has been authorized for detection and/or diagnosis of SARS-CoV-2 by FDA under an Emergency Use Authorization (EUA). This EUA will remain  in effect (meaning this test can be used) for the duration of the COVID-19 declaration under Section 564(b)(1) of the Act, 21 U.S.C.section 360bbb-3(b)(1), unless the authorization is terminated  or revoked sooner.       Influenza A by PCR NEGATIVE NEGATIVE Final   Influenza B by PCR NEGATIVE NEGATIVE Final    Comment: (NOTE) The Xpert Xpress SARS-CoV-2/FLU/RSV plus assay is intended as an aid in the diagnosis of influenza from Nasopharyngeal swab specimens and should not be used as a sole basis for treatment. Nasal washings and aspirates are unacceptable for Xpert Xpress SARS-CoV-2/FLU/RSV testing.  Fact Sheet for Patients: BloggerCourse.com  Fact Sheet for Healthcare Providers: SeriousBroker.it  This test is not yet approved or cleared by the [place]  FDA and has been authorized for detection and/or diagnosis of SARS-CoV-2 by FDA under an Emergency Use Authorization (EUA). This  EUA will remain in effect (meaning this test can be used) for the duration of the COVID-19 declaration under Section 564(b)(1) of the Act, 21 U.S.C. section 360bbb-3(b)(1), unless the authorization is terminated or revoked.     Resp Syncytial Virus by PCR NEGATIVE NEGATIVE Final    Comment: (NOTE) Fact Sheet for Patients: BloggerCourse.com  Fact Sheet for Healthcare Providers: SeriousBroker.it  This test is not yet approved or cleared by the [place]  FDA and has been authorized for detection and/or diagnosis of SARS-CoV-2 by FDA under an Emergency Use Authorization (EUA). This EUA will remain in effect (meaning this test can be used) for the duration of the COVID-19 declaration under Section 564(b)(1) of the Act, 21 U.S.C. section 360bbb-3(b)(1), unless the authorization is terminated or revoked.  Performed at [facility], [address], [place], [place] [address]     Coagulation Studies: No results for input(s): LABPROT, INR in the last 72 hours.  Urinalysis: No results for input(s): COLORURINE, LABSPEC, PHURINE, GLUCOSEU, HGBUR, BILIRUBINUR, KETONESUR, PROTEINUR, UROBILINOGEN, NITRITE, LEUKOCYTESUR in the last 72 hours.  Invalid input(s): APPERANCEUR    Imaging: DG Chest 2 View Result Date: 08/15/2024 CLINICAL DATA:  Initial evaluation for acute shortness of breath. EXAM: CHEST - 2 VIEW COMPARISON:  Prior radiograph from 08/06/2024 FINDINGS: Cardiomegaly, stable. Mediastinal silhouette within normal limits. Aortic atherosclerosis. Lungs normally inflated. Perihilar vascular congestion with diffuse interstitial prominence, consistent with pulmonary interstitial  congestion/edema. No pleural effusion. No visible focal infiltrates. No pneumothorax. Visualized soft tissues and osseous structures demonstrate no acute finding. Mild thoracic scoliosis noted. IMPRESSION: Cardiomegaly with pulmonary  interstitial congestion/edema, consistent with CHF. Electronically Signed   By: Morene Hoard M.D.   On: 08/15/2024 04:06     Medications:    sodium chloride       allopurinol   200 mg Oral Daily   amLODipine   10 mg Oral Daily   aspirin  EC  81 mg Oral Daily   atorvastatin   80 mg Oral Daily   carvedilol   25 mg Oral BID WC   Chlorhexidine  Gluconate Cloth  6 each Topical Q0600   cloNIDine   0.3 mg Oral TID   clopidogrel   75 mg Oral Daily   ezetimibe   10 mg Oral QPM   [START ON 08/16/2024] furosemide   80 mg Oral Daily   gabapentin   100 mg Oral TID   heparin   5,000 Units Subcutaneous Q12H   insulin  aspart  0-20 Units Subcutaneous TID WC   insulin  aspart  0-5 Units Subcutaneous QHS   insulin  glargine-yfgn  30 Units Subcutaneous QHS   isosorbide  mononitrate  30 mg Oral Q supper   linagliptin  5 mg Oral Daily   pantoprazole   40 mg Oral Daily   sodium chloride  flush  3 mL Intravenous Q12H   spironolactone  25 mg Oral Daily   sucroferric oxyhydroxide  500 mg Oral TID   umeclidinium-vilanterol  1 puff Inhalation Daily   sodium chloride , acetaminophen , albuterol , alteplase , fluticasone , heparin , heparin , lidocaine -prilocaine , lidocaine -prilocaine , linaclotide , ondansetron  (ZOFRAN ) IV, pentafluoroprop-tetrafluoroeth, pentafluoroprop-tetrafluoroeth, sodium chloride  flush  Assessment/ Plan:  Kelly Key is a 53 y.o.  male with past medical conditions including hypertension, diabetes, COPD, PAD, and end-stage renal disease on hemodialysis.  Patient presents to the emergency department complaining of shortness of breath and has been admitted for Acute pulmonary edema (HCC) [J81.0] Hypervolemia, unspecified hypervolemia type [E87.70] CHF (congestive heart failure) (HCC) [I50.9]  [facility]/TTS/left upper AVF  End-stage renal disease on hemodialysis.  Last treatment received on Saturday.  Patient receiving dialysis today, UF goal 3.5 L.  Patient agreeable to additional UF only  treatment for fluid removal tomorrow.  Patient will likely need daily dialysis for 2 to 3 days to manage volume overload.  2.  Acute respiratory failure, due to increased fluid intake.  Patient reports been 11 kg over dry weight at dialysis Saturday.  Chest x-ray suggestive of pulmonary congestion/edema, consistent with CHF.  Will attempt fluid removal with dialysis.  3. Anemia of chronic kidney disease Lab Results  Component Value Date   HGB 8.3 (L) 08/15/2024    Hemoglobin decreased but stable.  Patient does receive Mircera at outpatient clinic.  Will continue to monitor for need of ESA during this admission.  4. Secondary Hyperparathyroidism: with outpatient labs: None available Lab Results  Component Value Date   CALCIUM  7.8 (L) 08/15/2024   CAION 1.10 (L) 11/11/2019   PHOS 6.9 (H) 08/08/2024    Phosphorus elevated.  Patient prescribed Velphoro  outpatient with meals.  This has been continued during this admission.   LOS: 0 Sorcha Rotunno 10/20/20252:15 PM

## 2024-08-15 NOTE — Progress Notes (Signed)
   08/15/24 1220  Pain Assessment  Pain Scale 0-10  Pain Score 0  Fistula / Graft Left Upper arm Arteriovenous fistula  Placement Date/Time: 11/11/19 0843   Placed prior to admission: No  Orientation: Left  Access Location: Upper arm  Access Type: Arteriovenous fistula  Site Condition No complications  Fistula / Graft Assessment Present;Thrill;Bruit  Status Deaccessed  Needle Size 15  Drainage Description None  Neurological  Level of Consciousness Alert  Orientation Level Oriented X4  Respiratory  Respiratory Pattern Regular;Unlabored  Chest Assessment Chest expansion symmetrical  Bilateral Breath Sounds Expiratory wheezes;Diminished  Cardiac  Pulse Regular  Heart Sounds S1, S2  Jugular Venous Distention (JVD) No  ECG Monitor Yes  Vascular  R Radial Pulse +2  L Radial Pulse +2  R Dorsalis Pedis Pulse +2  L Dorsalis Pedis Pulse +2  Edema Generalized  Generalized Edema +1  RLE Edema +1  LLE Edema +1  Psychosocial  Psychosocial (WDL) WDL

## 2024-08-15 NOTE — H&P (Signed)
 History and Physical    Kelly Key DOB: 12-21-1970 DOA: 08/15/2024  PCP: Luke Agent, MD (Inactive) (Confirm with patient/family/NH records and if not entered, this has to be entered at [facility] point of entry) Patient coming from: [facility]  I have personally briefly reviewed patient's old medical records in [facility]  Chief Complaint: SOB  HPI: Kelly Key is a 53 y.o. male with medical history significant of ESRD on HD TTS, refractory HTN, IDDM with insulin  resistance, PAD, COPD, chronic HFpEF, presented with increasing shortness of breath.  Patient reports that he has been compliant with HD schedule and last dialysis was last Saturday.  Sunday patient started to feel increasing shortness of breath but denied any cough no chest pain.  He could not lie flat at all at night because of shortness of breath and decided to come to ED.  He also admitted that he has not been compliant with fluid restriction,  feeling thirsty all the time on average days he estimated that he drinks a total of 4-5 bottles of water.  He has not been trying to increase his Lasix . ED Course: Afebrile, blood pressure elevated 170/90 O2 saturation 100% on room air.  Chest x-ray showed pulmonary congestion, blood work showed glucose 426 hemoglobin 8.3 compared to baseline 8.0-9.0, BUN 88 creatinine 9.4 bicarb 23K 4.5.  Patient was shifted to inpatient HD for emergency HD.  Review of Systems: As per HPI otherwise 14 point review of systems negative.    Past Medical History:  Diagnosis Date   Acute diastolic CHF (congestive heart failure) (HCC) 06/11/2012   EF 50-55% [facility])   Acute gout of right knee 08/03/2019   Chronic kidney disease    COPD (chronic obstructive pulmonary disease) (HCC)    Diabetes mellitus    A1c 11.5 (06/11/2012).   Dyspnea    Gout    Hepatic steatosis 06/11/2012   Elevated LFTs   Hyperlipemia    Malignant hypertension    Microcytic anemia 06/12/2012   NSTEMI  (non-ST elevated myocardial infarction) (HCC) 05/17/2021   Obesity    Thromboembolism (HCC) 02/20/2019    Past Surgical History:  Procedure Laterality Date   ABDOMINAL AORTOGRAM W/LOWER EXTREMITY N/A 12/01/2022   Procedure: ABDOMINAL AORTOGRAM W/LOWER EXTREMITY;  Surgeon: Gretta Lonni PARAS, MD;  Location: [facility] INVASIVE CV LAB;  Service: Cardiovascular;  Laterality: N/A;   AMPUTATION Left 12/03/2022   Procedure: LEFT SECOND TOE AMPUTATION;  Surgeon: Harden Jerona GAILS, MD;  Location: [facility] OR;  Service: Orthopedics;  Laterality: Left;   AV FISTULA PLACEMENT Left 11/11/2019   Procedure: ARTERIOVENOUS (AV) FISTULA CREATION LEFT BRACHIOCEPHALIC ARM;  Surgeon: Serene Gaile ORN, MD;  Location: [facility] OR;  Service: Vascular;  Laterality: Left;   COLONOSCOPY WITH PROPOFOL  N/A 09/02/2021   Procedure: COLONOSCOPY WITH PROPOFOL ;  Surgeon: Therisa Bi, MD;  Location: [facility] ENDOSCOPY;  Service: Gastroenterology;  Laterality: N/A;   IR FLUORO GUIDE CV LINE RIGHT  07/08/2019   IR REMOVAL TUN CV CATH W/O FL  03/23/2020   IR US  GUIDE VASC ACCESS RIGHT  07/08/2019   LEFT HEART CATH AND CORONARY ANGIOGRAPHY N/A 06/12/2021   Procedure: LEFT HEART CATH AND CORONARY ANGIOGRAPHY;  Surgeon: Claudene Victory ORN, MD;  Location: [facility] INVASIVE CV LAB;  Service: Cardiovascular;  Laterality: N/A;   NO PAST SURGERIES     PERIPHERAL VASCULAR BALLOON ANGIOPLASTY Left 12/01/2022   Procedure: PERIPHERAL VASCULAR BALLOON ANGIOPLASTY;  Surgeon: Gretta Lonni PARAS, MD;  Location: [facility] INVASIVE CV LAB;  Service: Cardiovascular;  Laterality: Left;  Anterior tibial   PERIPHERAL VASCULAR INTERVENTION Left 12/01/2022   Procedure: PERIPHERAL VASCULAR INTERVENTION;  Surgeon: Gretta Lonni PARAS, MD;  Location: [facility] INVASIVE CV LAB;  Service: Cardiovascular;  Laterality: Left;  sfa   VASCULAR SURGERY       reports that he has never smoked. He has been exposed to tobacco smoke. He has never used smokeless tobacco. He reports that he does not currently use alcohol. He  reports that he does not use drugs.  Allergies  Allergen Reactions   Ozempic  (0.25 Or 0.5 Mg-Dose) [Semaglutide (0.25 Or 0.5mg -Dos)] Shortness Of Breath   Hydralazine  Anxiety and Other (See Comments)    Hallucinations    Lisinopril Swelling    Family History  Problem Relation Age of Onset   Gout Mother    Asthma Mother    Diabetes Father    Heart failure Father    Diabetes Sister    Hypertension Brother    Pancreatic cancer Brother    Diabetes Sister      Prior to Admission medications   Medication Sig Start Date End Date Taking? Authorizing Provider  acetaminophen  (TYLENOL ) 500 MG tablet Take 1,000 mg by mouth every 6 (six) hours as needed for moderate pain or headache.   Yes [provider]  albuterol  (VENTOLIN  HFA) 108 (90 Base) MCG/ACT inhaler Inhale 2 puffs into the lungs every 4 (four) hours as needed for wheezing or shortness of breath. 08/01/24  Yes Yolande Lamar BROCKS, MD  allopurinol  (ZYLOPRIM ) 100 MG tablet Take 2 tablets (200 mg total) by mouth daily. 08/04/19  Yes Love, Sharlet RAMAN, PA-C  amLODipine  (NORVASC ) 10 MG tablet Take 1 tablet (10 mg total) by mouth daily. 04/04/24  Yes Miriam Norris, NP  amoxicillin -clavulanate (AUGMENTIN) 500-125 MG tablet Take 1 tablet by mouth daily for 7 days. Take after HD on dialysis days 08/08/24 08/15/24 Yes Ricky Fines, MD  aspirin  EC 81 MG tablet Take 1 tablet (81 mg total) by mouth daily. 05/06/23  Yes Emokpae, Courage, MD  atorvastatin  (LIPITOR ) 80 MG tablet Take 1 tablet (80 mg total) by mouth daily. 06/13/24  Yes Miriam Norris, NP  carvedilol  (COREG ) 25 MG tablet Take 1 tablet (25 mg total) by mouth 2 (two) times daily with a meal. 05/06/23  Yes Emokpae, Courage, MD  cloNIDine  (CATAPRES ) 0.3 MG tablet Take 1 tablet (0.3 mg total) by mouth 3 (three) times daily. 05/06/23  Yes Emokpae, Courage, MD  clopidogrel  (PLAVIX ) 75 MG tablet Take 75 mg by mouth daily. 08/26/23  Yes [provider]  dextromethorphan -guaiFENesin   (MUCINEX  DM) 30-600 MG 12hr tablet Take 1 tablet by mouth 2 (two) times daily for 7 days. 08/08/24 08/15/24 Yes Ricky Fines, MD  ezetimibe  (ZETIA ) 10 MG tablet Take 10 mg by mouth every evening. 01/20/19  Yes [provider]  fluticasone  (FLONASE ) 50 MCG/ACT nasal spray Place 1 spray into both nostrils at bedtime as needed for allergies or rhinitis.   Yes [provider]  gabapentin  (NEURONTIN ) 100 MG capsule Take 1 capsule (100 mg total) by mouth 3 (three) times daily. 08/03/19  Yes Love, Sharlet RAMAN, PA-C  HUMALOG KWIKPEN 100 UNIT/ML KwikPen Inject 8-30 Units into the skin 3 (three) times daily. Sliding scale 30 units max TID 08/19/22  Yes [provider]  Ipratropium-Albuterol  (COMBIVENT ) 20-100 MCG/ACT AERS respimat Inhale 1 puff into the lungs every 6 (six) hours as needed for wheezing or shortness of breath. 09/30/23 08/15/24 Yes Von Bellis, MD  isosorbide  mononitrate (IMDUR ) 30 MG 24 hr  tablet Take 0.5 tablets (15 mg total) by mouth daily with supper. 08/05/24 11/03/24 Yes Laurence Locus, DO  linaclotide  (LINZESS ) 145 MCG CAPS capsule Take 145 mcg by mouth daily as needed (constipation).   Yes [provider]  multivitamin (RENA-VIT) TABS tablet Take 1 tablet by mouth at bedtime. 06/16/24  Yes [provider]  pantoprazole  (PROTONIX ) 40 MG tablet Take 1 tablet (40 mg total) by mouth daily. 08/09/24  Yes Ricky Fines, MD  spironolactone (ALDACTONE) 25 MG tablet Take 1 tablet (25 mg total) by mouth daily. 08/09/24  Yes Ricky Fines, MD  TRESIBA FLEXTOUCH 100 UNIT/ML FlexTouch Pen Inject 30 Units into the skin at bedtime. 08/08/24  Yes Ricky Fines, MD  VELPHORO  500 MG chewable tablet Chew 500 mg by mouth 3 (three) times daily. 08/13/23  Yes [provider]  glucose blood (ACCU-CHEK AVIVA PLUS) test strip Use as instructed TO CHECK BLOOD GLUCOSE THREE TIMES DAILY 08/15/20   Nida, Gebreselassie W, MD  Insulin  Pen Needle (B-D ULTRAFINE III SHORT  PEN) 31G X 8 MM MISC 1 each by Does not apply route as directed. 08/13/20   Nida, Gebreselassie W, MD  predniSONE  (DELTASONE ) 20 MG tablet Take 2 tabs daily X 1 day; then 1 tablet daily X 3 days and then 1/2 tablet daily X 3 days and stop prednisone . Patient not taking: Reported on 08/15/2024 08/09/24   Ricky Fines, MD    Physical Exam: Vitals:   08/15/24 0817 08/15/24 0825 08/15/24 0830 08/15/24 0900  BP: (!) 199/111 (!) 189/100 (!) 162/87 (!) 165/90  Pulse: 71 67 80 68  Resp: 13 12 20 11   Temp:      TempSrc:      SpO2: 97% 99% 98% 99%  Weight:        Constitutional: NAD, calm, comfortable Vitals:   08/15/24 0817 08/15/24 0825 08/15/24 0830 08/15/24 0900  BP: (!) 199/111 (!) 189/100 (!) 162/87 (!) 165/90  Pulse: 71 67 80 68  Resp: 13 12 20 11   Temp:      TempSrc:      SpO2: 97% 99% 98% 99%  Weight:       Eyes: PERRL, lids and conjunctivae normal ENMT: Mucous membranes are moist. Posterior pharynx clear of any exudate or lesions.Normal dentition.  Neck: normal, supple, no masses, no thyromegaly Respiratory: clear to auscultation bilaterally, no wheezing, fine crackles on bilateral lower fields, increasing breathing effort respiratory effort. No accessory muscle use.  Cardiovascular: Regular rate and rhythm, no murmurs / rubs / gallops. 2+ extremity edema. 2+ pedal pulses. No carotid bruits.  Abdomen: no tenderness, no masses palpated. No hepatosplenomegaly. Bowel sounds positive.  Musculoskeletal: no clubbing / cyanosis. No joint deformity upper and lower extremities. Good ROM, no contractures. Normal muscle tone.  Skin: no rashes, lesions, ulcers. No induration Neurologic: CN 2-12 grossly intact. Sensation intact, DTR normal. Strength 5/5 in all 4.  Psychiatric: Normal judgment and insight. Alert and oriented x 3. Normal mood.     Labs on Admission: I have personally reviewed following labs and imaging studies  CBC: Recent Labs  Lab 08/15/24 0312  WBC 9.5  HGB 8.3*   HCT 25.8*  MCV 91.8  PLT 229   Basic Metabolic Panel: Recent Labs  Lab 08/15/24 0312  NA 132*  K 4.5  CL 90*  CO2 23  GLUCOSE 426*  BUN 88*  CREATININE 9.44*  CALCIUM  7.8*   GFR: Estimated Creatinine Clearance: 13.4 mL/min (A) (by C-G formula based on SCr of 9.44 mg/dL (  H)). Liver Function Tests: No results for input(s): AST, ALT, ALKPHOS, BILITOT, PROT, ALBUMIN  in the last 168 hours. No results for input(s): LIPASE, AMYLASE in the last 168 hours. No results for input(s): AMMONIA in the last 168 hours. Coagulation Profile: No results for input(s): INR, PROTIME in the last 168 hours. Cardiac Enzymes: No results for input(s): CKTOTAL, CKMB, CKMBINDEX, TROPONINI in the last 168 hours. BNP (last 3 results) No results for input(s): PROBNP in the last 8760 hours. HbA1C: No results for input(s): HGBA1C in the last 72 hours. CBG: Recent Labs  Lab 08/08/24 1138 08/08/24 1625  GLUCAP 345* 433*   Lipid Profile: No results for input(s): CHOL, HDL, LDLCALC, TRIG, CHOLHDL, LDLDIRECT in the last 72 hours. Thyroid  Function Tests: No results for input(s): TSH, T4TOTAL, FREET4, T3FREE, THYROIDAB in the last 72 hours. Anemia Panel: No results for input(s): VITAMINB12, FOLATE, FERRITIN, TIBC, IRON, RETICCTPCT in the last 72 hours. Urine analysis:    Component Value Date/Time   COLORURINE STRAW (A) 04/20/2021 1740   APPEARANCEUR CLEAR 04/20/2021 1740   LABSPEC 1.011 04/20/2021 1740   PHURINE 8.0 04/20/2021 1740   GLUCOSEU 150 (A) 04/20/2021 1740   HGBUR NEGATIVE 04/20/2021 1740   BILIRUBINUR NEGATIVE 04/20/2021 1740   KETONESUR NEGATIVE 04/20/2021 1740   PROTEINUR 100 (A) 04/20/2021 1740   UROBILINOGEN 0.2 06/11/2012 0518   NITRITE NEGATIVE 04/20/2021 1740   LEUKOCYTESUR NEGATIVE 04/20/2021 1740    Radiological Exams on Admission: DG Chest 2 View Result Date: 08/15/2024 CLINICAL DATA:  Initial evaluation  for acute shortness of breath. EXAM: CHEST - 2 VIEW COMPARISON:  Prior radiograph from 08/06/2024 FINDINGS: Cardiomegaly, stable. Mediastinal silhouette within normal limits. Aortic atherosclerosis. Lungs normally inflated. Perihilar vascular congestion with diffuse interstitial prominence, consistent with pulmonary interstitial congestion/edema. No pleural effusion. No visible focal infiltrates. No pneumothorax. Visualized soft tissues and osseous structures demonstrate no acute finding. Mild thoracic scoliosis noted. IMPRESSION: Cardiomegaly with pulmonary interstitial congestion/edema, consistent with CHF. Electronically Signed   By: Morene Hoard M.D.   On: 08/15/2024 04:06    EKG: Independently reviewed.  Sinus rhythm, no acute ST changes.  Assessment/Plan Principal Problem:   Acute pulmonary edema (HCC) Active Problems:   Acute on chronic diastolic CHF (congestive heart failure) (HCC)   ESRD (end stage renal disease) on dialysis (HCC)   CHF (congestive heart failure) (HCC)  (please populate well all problems here in Problem List. (For example, if patient is on BP meds at home and you resume or decide to hold them, it is a problem that needs to be her. Same for CAD, COPD, HLD and so on)  Acute on chronic HFpEF decompensation Fluid overload ESRD on HD - CHF decompensation/fluid overload likely secondary to noncompliant with fluid restriction. - Education about importance of fluid restriction and monitoring weight at least 2-3 times a week.  Patient agreed with the plan. - Consider increase Lasix  when symptomatic of fluid overload, patient agreed - Nephrology plans to do HD today and tomorrow.  Refractory HTN - Due to allergy to hydralazine , have to continue current BP medication regimen including amlodipine , clonidine , Coreg , Aldactone  Morbid obesity - BMI= 49 - Patient reported that he did not tolerate GLP-1 agonist therapy due to severe shortness of breath after the first  injection.  Therapy was abandoned. - Encourage calorie restriction, patient expressed that might be challenging  IDDM with hyperglycemia - With insulin  resistance, add Januvia - Continue Lantus  30 units daily - SSI  Chronic anemia secondary to CKD - H&H  stable, no symptoms signs of bleeding - EPO therapy outpatient    DVT prophylaxis: Lovenox  Code Status: Full code Family Communication: None at bedside Disposition Plan: Expect more than 2 midnight hospital stay, as patient requiring likely at least 2 sessions of HD to address fluid overload. Consults called: Nephrology Admission status: Telemetry admission   Cort ONEIDA Mana MD [facility] Pager [phone]  08/15/2024, 9:16 AM

## 2024-08-16 DIAGNOSIS — N186 End stage renal disease: Secondary | ICD-10-CM | POA: Diagnosis not present

## 2024-08-16 DIAGNOSIS — D631 Anemia in chronic kidney disease: Secondary | ICD-10-CM | POA: Insufficient documentation

## 2024-08-16 DIAGNOSIS — E872 Acidosis, unspecified: Secondary | ICD-10-CM | POA: Insufficient documentation

## 2024-08-16 DIAGNOSIS — I5033 Acute on chronic diastolic (congestive) heart failure: Secondary | ICD-10-CM

## 2024-08-16 DIAGNOSIS — Z992 Dependence on renal dialysis: Secondary | ICD-10-CM

## 2024-08-16 DIAGNOSIS — E66813 Obesity, class 3: Secondary | ICD-10-CM | POA: Diagnosis not present

## 2024-08-16 DIAGNOSIS — J810 Acute pulmonary edema: Secondary | ICD-10-CM | POA: Diagnosis not present

## 2024-08-16 LAB — GLUCOSE, CAPILLARY
Glucose-Capillary: 145 mg/dL — ABNORMAL HIGH (ref 70–99)
Glucose-Capillary: 180 mg/dL — ABNORMAL HIGH (ref 70–99)
Glucose-Capillary: 289 mg/dL — ABNORMAL HIGH (ref 70–99)

## 2024-08-16 MED ORDER — FUROSEMIDE 80 MG PO TABS: 80.0000 mg | ORAL_TABLET | Freq: Every day | ORAL | 0 refills | Status: AC

## 2024-08-16 NOTE — Progress Notes (Signed)
   08/16/24 1215  Vitals  Temp 98.8 F (37.1 C)  Temp Source Oral  BP (!) 152/74  BP Location Right Arm  BP Method Automatic  Patient Position (if appropriate) Lying  Pulse Rate 78  Pulse Rate Source Monitor  Resp 18  Oxygen Therapy  SpO2 100 %  O2 Device Room Air  Patient Activity (if Appropriate) In bed  Pulse Oximetry Type Continuous  During Treatment Monitoring  Blood Flow Rate (mL/min) 0 mL/min  Arterial Pressure (mmHg) -2.63 mmHg  Venous Pressure (mmHg) -2.02 mmHg  TMP (mmHg) -20 mmHg  Ultrafiltration Rate (mL/min) 0 mL/min  Dialysate Flow Rate (mL/min) 300 ml/min  Duration of HD Treatment -hour(s) 2.94 hour(s)  Cumulative Fluid Removed (mL) per Treatment  2780.17  HD Safety Checks Performed Yes  Intra-Hemodialysis Comments Progressing as prescribed  Fistula / Graft Left Upper arm Arteriovenous fistula  Placement Date/Time: 11/11/19 0843   Placed prior to admission: No  Orientation: Left  Access Location: Upper arm  Access Type: Arteriovenous fistula  Site Condition No complications  Status Flushed  Drainage Description None

## 2024-08-16 NOTE — Discharge Summary (Signed)
 Physician Discharge Summary   Patient: Kelly Key MRN: [id number] DOB: February 19, 1971  Admit date:     08/15/2024  Discharge date: 08/16/24  Discharge Physician: Murvin Mana   PCP: Luke Agent, MD (Inactive)   Recommendations at discharge:   Follow-up with PCP in 1 week. Advised patient to compliant with renal diet and fluid restriction.  Discharge Diagnoses: Principal Problem:   Acute pulmonary edema (HCC) Active Problems:   Acute on chronic diastolic CHF (congestive heart failure) (HCC)   ESRD (end stage renal disease) on dialysis (HCC)   Hyponatremia   Uncontrolled type 2 diabetes mellitus with hyperglycemia, with long-term current use of insulin  (HCC)   CHF (congestive heart failure) (HCC)   Class 3 obesity (HCC)   Metabolic acidosis   Anemia due to end stage renal disease (HCC)  Resolved Problems:   * No resolved hospital problems. *  Hospital Course: Kelly Key is a 53 y.o. male with medical history significant of ESRD on HD TTS, refractory HTN, IDDM with insulin  resistance, PAD, COPD, chronic HFpEF, presented with increasing shortness of breath.  Receiving regular dialysis, but has not been compliant with fluid restriction.  He came to the hospital with volume overload, he was dialyzed on 10/20 and 10/21.  Discussed with nephrology, patient has back to his dry weight.  Medically stable for discharge. Nephrology has started Lasix  80 mg oral daily. For patient other conditions, I did not adjust his home medicines.        Consultants: Nephrology Procedures performed: HD  Disposition: Home Diet recommendation:  Discharge Diet Orders (From admission, onward)     Start     Ordered   08/16/24 0000  Diet renal with fluid restriction        08/16/24 1258           Renal diet DISCHARGE MEDICATION: Allergies as of 08/16/2024       Reactions   Ozempic  (0.25 Or 0.5 Mg-dose) [semaglutide (0.25 Or 0.5mg -dos)] Shortness Of Breath   Hydralazine  Anxiety, Other (See  Comments)   Hallucinations    Vitamin K And Related    Lisinopril Swelling        Medication List     STOP taking these medications    amoxicillin -clavulanate 500-125 MG tablet Commonly known as: Augmentin   dextromethorphan -guaiFENesin  30-600 MG 12hr tablet Commonly known as: MUCINEX  DM       TAKE these medications    Accu-Chek Aviva Plus test strip Generic drug: glucose blood Use as instructed TO CHECK BLOOD GLUCOSE THREE TIMES DAILY   acetaminophen  500 MG tablet Commonly known as: TYLENOL  Take 1,000 mg by mouth every 6 (six) hours as needed for moderate pain or headache.   albuterol  108 (90 Base) MCG/ACT inhaler Commonly known as: VENTOLIN  HFA Inhale 2 puffs into the lungs every 4 (four) hours as needed for wheezing or shortness of breath.   allopurinol  100 MG tablet Commonly known as: ZYLOPRIM  Take 2 tablets (200 mg total) by mouth daily.   amLODipine  10 MG tablet Commonly known as: NORVASC  Take 1 tablet (10 mg total) by mouth daily.   aspirin  EC 81 MG tablet Take 1 tablet (81 mg total) by mouth daily.   atorvastatin  80 MG tablet Commonly known as: LIPITOR  Take 1 tablet (80 mg total) by mouth daily.   B-D ULTRAFINE III SHORT PEN 31G X 8 MM Misc Generic drug: Insulin  Pen Needle 1 each by Does not apply route as directed.   carvedilol  25 MG tablet Commonly known as:  COREG  Take 1 tablet (25 mg total) by mouth 2 (two) times daily with a meal.   cloNIDine  0.3 MG tablet Commonly known as: CATAPRES  Take 1 tablet (0.3 mg total) by mouth 3 (three) times daily.   clopidogrel  75 MG tablet Commonly known as: PLAVIX  Take 75 mg by mouth daily.   ezetimibe  10 MG tablet Commonly known as: ZETIA  Take 10 mg by mouth every evening.   fluticasone  50 MCG/ACT nasal spray Commonly known as: FLONASE  Place 1 spray into both nostrils at bedtime as needed for allergies or rhinitis.   furosemide  80 MG tablet Commonly known as: LASIX  Take 1 tablet (80 mg total) by  mouth daily.   gabapentin  100 MG capsule Commonly known as: NEURONTIN  Take 1 capsule (100 mg total) by mouth 3 (three) times daily.   HumaLOG KwikPen 100 UNIT/ML KwikPen Generic drug: insulin  lispro Inject 8-30 Units into the skin 3 (three) times daily. Sliding scale 30 units max TID   Ipratropium-Albuterol  20-100 MCG/ACT Aers respimat Commonly known as: COMBIVENT  Inhale 1 puff into the lungs every 6 (six) hours as needed for wheezing or shortness of breath.   isosorbide  mononitrate 30 MG 24 hr tablet Commonly known as: IMDUR  Take 0.5 tablets (15 mg total) by mouth daily with supper.   Linzess  145 MCG Caps capsule Generic drug: linaclotide  Take 145 mcg by mouth daily as needed (constipation).   multivitamin Tabs tablet Take 1 tablet by mouth at bedtime.   pantoprazole  40 MG tablet Commonly known as: PROTONIX  Take 1 tablet (40 mg total) by mouth daily.   spironolactone 25 MG tablet Commonly known as: ALDACTONE Take 1 tablet (25 mg total) by mouth daily.   Tresiba FlexTouch 100 UNIT/ML FlexTouch Pen Generic drug: insulin  degludec Inject 30 Units into the skin at bedtime.   Velphoro  500 MG chewable tablet Generic drug: sucroferric oxyhydroxide Chew 500 mg by mouth 3 (three) times daily.        Follow-up Information     Luke Agent, MD Follow up in 1 week(s).   Specialty: Internal Medicine Contact information: [address] [place] [address] [phone]                Discharge Exam: Kelly Key   08/15/24 1300 08/16/24 0806 08/16/24 1232  Weight: (!) 158.2 kg (!) 158.5 kg (!) 150.3 kg   General exam: Appears calm and comfortable, morbid obesity  Respiratory system: Clear to auscultation. Respiratory effort normal. Cardiovascular system: S1 & S2 heard, RRR. No JVD, murmurs, rubs, gallops or clicks. No pedal edema. Gastrointestinal system: Abdomen is nondistended, soft and nontender. No organomegaly or masses felt. Normal bowel  sounds heard. Central nervous system: Alert and oriented. No focal neurological deficits. Extremities: Symmetric 5 x 5 power. Skin: No rashes, lesions or ulcers Psychiatry: Judgement and insight appear normal. Mood & affect appropriate.    Condition at discharge: good  The results of significant diagnostics from this hospitalization (including imaging, microbiology, ancillary and laboratory) are listed below for reference.   Imaging Studies: DG Chest 2 View Result Date: 08/15/2024 CLINICAL DATA:  Initial evaluation for acute shortness of breath. EXAM: CHEST - 2 VIEW COMPARISON:  Prior radiograph from 08/06/2024 FINDINGS: Cardiomegaly, stable. Mediastinal silhouette within normal limits. Aortic atherosclerosis. Lungs normally inflated. Perihilar vascular congestion with diffuse interstitial prominence, consistent with pulmonary interstitial congestion/edema. No pleural effusion. No visible focal infiltrates. No pneumothorax. Visualized soft tissues and osseous structures demonstrate no acute finding. Mild thoracic scoliosis noted. IMPRESSION: Cardiomegaly with pulmonary  interstitial congestion/edema, consistent with CHF. Electronically Signed   By: Morene Hoard M.D.   On: 08/15/2024 04:06   DG Chest Portable 1 View Result Date: 08/06/2024 EXAM: 1 VIEW(S) XRAY OF THE CHEST 08/06/2024 05:10:07 AM COMPARISON: 08/04/2024 CLINICAL HISTORY: sob. Pt with c/o SOB, cough, and congestion since being discharged yesterday. Pt scheduled for HD this am but states was feeling too poorly. Pt's O2 sats on R/A were 85%. Placed pt on O2 @ 4L [place] and sats up to 95%. EDP @ bedside. FINDINGS: LUNGS AND PLEURA: Right basilar airspace opacities. Mild pulmonary edema. No pleural effusion. No pneumothorax. HEART AND MEDIASTINUM: No acute abnormality of the cardiac and mediastinal silhouettes. BONES AND SOFT TISSUES: No acute osseous abnormality. IMPRESSION: 1. Right basilar airspace opacities, suspicious for pneumonia  or aspiration. 2. Mild pulmonary edema. Electronically signed by: Waddell Calk MD 08/06/2024 05:18 AM EDT RP Workstation: [id number]   DG Chest 2 View Result Date: 08/04/2024 CLINICAL DATA:  Shortness of breath EXAM: CHEST - 2 VIEW COMPARISON:  08/01/2024 FINDINGS: Cardiac shadow is mildly prominent but accentuated by the frontal technique. Central vascular congestion is noted increased from the prior exam with mild edema. Increasing right basilar atelectasis is noted. IMPRESSION: Worsening CHF with new right basilar atelectasis. Electronically Signed   By: Oneil Devonshire M.D.   On: 08/04/2024 03:36   DG Chest 2 View Result Date: 08/01/2024 CLINICAL DATA:  Shortness of breath for several days, initial encounter EXAM: CHEST - 2 VIEW COMPARISON:  10/27/2023 FINDINGS: Cardiac shadow is enlarged. Mild vascular congestion is noted although improved when compared with the prior exam. No peripheral edema is seen. No focal infiltrate is noted. IMPRESSION: Mild central vascular congestion without edema. Electronically Signed   By: Oneil Devonshire M.D.   On: 08/01/2024 21:05    Microbiology: Results for orders placed or performed during the hospital encounter of 08/06/24  Resp panel by RT-PCR (RSV, Flu A&B, Covid) Anterior Nasal Swab     Status: None   Collection Time: 08/06/24  5:00 AM   Specimen: Anterior Nasal Swab  Result Value Ref Range Status   SARS Coronavirus 2 by RT PCR NEGATIVE NEGATIVE Final    Comment: (NOTE) SARS-CoV-2 target nucleic acids are NOT DETECTED.  The SARS-CoV-2 RNA is generally detectable in upper respiratory specimens during the acute phase of infection. The lowest concentration of SARS-CoV-2 viral copies this assay can detect is 138 copies/mL. A negative result does not preclude SARS-Cov-2 infection and should not be used as the sole basis for treatment or other patient management decisions. A negative result may occur with  improper specimen collection/handling, submission of  specimen other than nasopharyngeal swab, presence of viral mutation(s) within the areas targeted by this assay, and inadequate number of viral copies(<138 copies/mL). A negative result must be combined with clinical observations, patient history, and epidemiological information. The expected result is Negative.  Fact Sheet for Patients:  BloggerCourse.com  Fact Sheet for Healthcare Providers:  SeriousBroker.it  This test is no t yet approved or cleared by the [place]  FDA and  has been authorized for detection and/or diagnosis of SARS-CoV-2 by FDA under an Emergency Use Authorization (EUA). This EUA will remain  in effect (meaning this test can be used) for the duration of the COVID-19 declaration under Section 564(b)(1) of the Act, 21 U.S.C.section 360bbb-3(b)(1), unless the authorization is terminated  or revoked sooner.       Influenza A by PCR NEGATIVE NEGATIVE Final   Influenza B by PCR  NEGATIVE NEGATIVE Final    Comment: (NOTE) The Xpert Xpress SARS-CoV-2/FLU/RSV plus assay is intended as an aid in the diagnosis of influenza from Nasopharyngeal swab specimens and should not be used as a sole basis for treatment. Nasal washings and aspirates are unacceptable for Xpert Xpress SARS-CoV-2/FLU/RSV testing.  Fact Sheet for Patients: BloggerCourse.com  Fact Sheet for Healthcare Providers: SeriousBroker.it  This test is not yet approved or cleared by the [place]  FDA and has been authorized for detection and/or diagnosis of SARS-CoV-2 by FDA under an Emergency Use Authorization (EUA). This EUA will remain in effect (meaning this test can be used) for the duration of the COVID-19 declaration under Section 564(b)(1) of the Act, 21 U.S.C. section 360bbb-3(b)(1), unless the authorization is terminated or revoked.     Resp Syncytial Virus by PCR NEGATIVE NEGATIVE Final     Comment: (NOTE) Fact Sheet for Patients: BloggerCourse.com  Fact Sheet for Healthcare Providers: SeriousBroker.it  This test is not yet approved or cleared by the [place]  FDA and has been authorized for detection and/or diagnosis of SARS-CoV-2 by FDA under an Emergency Use Authorization (EUA). This EUA will remain in effect (meaning this test can be used) for the duration of the COVID-19 declaration under Section 564(b)(1) of the Act, 21 U.S.C. section 360bbb-3(b)(1), unless the authorization is terminated or revoked.  Performed at [facility], [address], [place], [place] [address]     Labs: CBC: Recent Labs  Lab 08/15/24 0312  WBC 9.5  HGB 8.3*  HCT 25.8*  MCV 91.8  PLT 229   Basic Metabolic Panel: Recent Labs  Lab 08/15/24 0312  NA 132*  K 4.5  CL 90*  CO2 23  GLUCOSE 426*  BUN 88*  CREATININE 9.44*  CALCIUM  7.8*   Liver Function Tests: No results for input(s): AST, ALT, ALKPHOS, BILITOT, PROT, ALBUMIN  in the last 168 hours. CBG: Recent Labs  Lab 08/15/24 1701 08/15/24 2047 08/16/24 0030 08/16/24 0738 08/16/24 1253  GLUCAP 385* 411* 289* 180* 145*    Discharge time spent: 35  minutes.  Signed: Murvin Mana, MD [facility] 08/16/2024

## 2024-08-16 NOTE — Progress Notes (Signed)
 [facility] - Clinic Note  08/24/2024   CHIEF COMPLAINT Patient presents for Retina Follow Up  HISTORY OF PRESENT ILLNESS: Kelly Key is a 53 y.o. male who presents to the clinic today for:  HPI     Retina Follow Up   Patient presents with  Diabetic Retinopathy.  In both eyes.  This started 14 months ago.  Severity is moderate.  Duration of 3 months.  Since onset it is stable.  I, the attending physician,  performed the HPI with the patient and updated documentation appropriately.        Comments   Pt denies any changes in vision/no FOL/floaters/pain. Pt does not use ats. BP=fluctuating BS=186 this morning A1c=9.6 last month      Last edited by Valdemar Rogue, MD on 08/28/2024  2:28 PM.    Pt states he was admitted to the hospital 10/27 due to SOB/fluid and heart failure. Vision seems ok, no changes noticed. Pt was placed on some steroids to clear up his lungs.   Referring physician: No referring provider defined for this encounter.  HISTORICAL INFORMATION:  Selected notes from the MEDICAL RECORD NUMBER Referred by Dr. Heron Salinas [facility]) for diabetic retinopathy LEE:  Ocular Hx- PMH-   CURRENT MEDICATIONS: No current outpatient medications on file. (Ophthalmic Drugs)   No current facility-administered medications for this visit. (Ophthalmic Drugs)   Current Outpatient Medications (Other)  Medication Sig   acetaminophen  (TYLENOL ) 500 MG tablet Take 1,000 mg by mouth every 6 (six) hours as needed for moderate pain or headache.   albuterol  (VENTOLIN  HFA) 108 (90 Base) MCG/ACT inhaler Inhale 2 puffs into the lungs every 4 (four) hours as needed for wheezing or shortness of breath.   allopurinol  (ZYLOPRIM ) 100 MG tablet Take 2 tablets (200 mg total) by mouth daily.   amLODipine  (NORVASC ) 10 MG tablet Take 1 tablet (10 mg total) by mouth daily.   aspirin  EC 81 MG tablet Take 1 tablet (81 mg total) by mouth daily.   atorvastatin  (LIPITOR )  80 MG tablet Take 1 tablet (80 mg total) by mouth daily.   carvedilol  (COREG ) 25 MG tablet Take 1 tablet (25 mg total) by mouth 2 (two) times daily with a meal.   cloNIDine  (CATAPRES ) 0.3 MG tablet Take 1 tablet (0.3 mg total) by mouth 3 (three) times daily.   clopidogrel  (PLAVIX ) 75 MG tablet Take 75 mg by mouth daily.   ezetimibe  (ZETIA ) 10 MG tablet Take 10 mg by mouth every evening.   fluticasone  (FLONASE ) 50 MCG/ACT nasal spray Place 1 spray into both nostrils at bedtime as needed for allergies or rhinitis.   furosemide  (LASIX ) 80 MG tablet Take 1 tablet (80 mg total) by mouth daily.   gabapentin  (NEURONTIN ) 100 MG capsule Take 1 capsule (100 mg total) by mouth 3 (three) times daily.   glucose blood (ACCU-CHEK AVIVA PLUS) test strip Use as instructed TO CHECK BLOOD GLUCOSE THREE TIMES DAILY   HUMALOG KWIKPEN 100 UNIT/ML KwikPen Inject 8-30 Units into the skin 3 (three) times daily. Sliding scale 30 units max TID   Insulin  Pen Needle (B-D ULTRAFINE III SHORT PEN) 31G X 8 MM MISC 1 each by Does not apply route as directed.   Ipratropium-Albuterol  (COMBIVENT ) 20-100 MCG/ACT AERS respimat Inhale 1 puff into the lungs every 6 (six) hours as needed for wheezing or shortness of breath.   isosorbide  mononitrate (IMDUR ) 30 MG 24 hr tablet Take 0.5 tablets (15 mg total) by mouth daily  with supper.   linaclotide  (LINZESS ) 145 MCG CAPS capsule Take 145 mcg by mouth daily as needed (constipation).   multivitamin (RENA-VIT) TABS tablet Take 1 tablet by mouth at bedtime.   pantoprazole  (PROTONIX ) 40 MG tablet Take 1 tablet (40 mg total) by mouth daily.   spironolactone (ALDACTONE) 25 MG tablet Take 1 tablet (25 mg total) by mouth daily.   TRESIBA FLEXTOUCH 100 UNIT/ML FlexTouch Pen Inject 30 Units into the skin at bedtime.   VELPHORO  500 MG chewable tablet Chew 500 mg by mouth 3 (three) times daily. (Patient not taking: Reported on 08/22/2024)   No current facility-administered medications for this visit.  (Other)   REVIEW OF SYSTEMS: ROS   Positive for: Gastrointestinal, Endocrine, Eyes, Respiratory Negative for: Constitutional, Neurological, Skin, Genitourinary, Musculoskeletal, HENT, Cardiovascular, Psychiatric, Allergic/Imm, Heme/Lymph Last edited by Elnor Avelina RAMAN, COT on 08/24/2024  9:15 AM.      ALLERGIES Allergies  Allergen Reactions   Hydrochlorothiazide Swelling   Ozempic  (0.25 Or 0.5 Mg-Dose) [Semaglutide (0.25 Or 0.5mg -Dos)] Shortness Of Breath   Hydralazine  Anxiety and Other (See Comments)    Hallucinations    Vitamin K And Related    Lisinopril Swelling   PAST MEDICAL HISTORY Past Medical History:  Diagnosis Date   Acute diastolic CHF (congestive heart failure) (HCC) 06/11/2012   EF 50-55% [facility])   Acute gout of right knee 08/03/2019   Chronic kidney disease    COPD (chronic obstructive pulmonary disease) (HCC)    Diabetes mellitus    A1c 11.5 (06/11/2012).   Dyspnea    Gout    Hepatic steatosis 06/11/2012   Elevated LFTs   Hyperlipemia    Malignant hypertension    Microcytic anemia 06/12/2012   NSTEMI (non-ST elevated myocardial infarction) (HCC) 05/17/2021   Obesity    Thromboembolism (HCC) 02/20/2019   Past Surgical History:  Procedure Laterality Date   ABDOMINAL AORTOGRAM W/LOWER EXTREMITY N/A 12/01/2022   Procedure: ABDOMINAL AORTOGRAM W/LOWER EXTREMITY;  Surgeon: Gretta Lonni PARAS, MD;  Location: [facility] INVASIVE CV LAB;  Service: Cardiovascular;  Laterality: N/A;   AMPUTATION Left 12/03/2022   Procedure: LEFT SECOND TOE AMPUTATION;  Surgeon: Harden Jerona GAILS, MD;  Location: [facility] OR;  Service: Orthopedics;  Laterality: Left;   AV FISTULA PLACEMENT Left 11/11/2019   Procedure: ARTERIOVENOUS (AV) FISTULA CREATION LEFT BRACHIOCEPHALIC ARM;  Surgeon: Serene Gaile ORN, MD;  Location: [facility] OR;  Service: Vascular;  Laterality: Left;   COLONOSCOPY WITH PROPOFOL  N/A 09/02/2021   Procedure: COLONOSCOPY WITH PROPOFOL ;  Surgeon: Therisa Bi, MD;  Location: [facility]  ENDOSCOPY;  Service: Gastroenterology;  Laterality: N/A;   IR FLUORO GUIDE CV LINE RIGHT  07/08/2019   IR REMOVAL TUN CV CATH W/O FL  03/23/2020   IR US  GUIDE VASC ACCESS RIGHT  07/08/2019   LEFT HEART CATH AND CORONARY ANGIOGRAPHY N/A 06/12/2021   Procedure: LEFT HEART CATH AND CORONARY ANGIOGRAPHY;  Surgeon: Claudene Victory ORN, MD;  Location: [facility] INVASIVE CV LAB;  Service: Cardiovascular;  Laterality: N/A;   NO PAST SURGERIES     PERIPHERAL VASCULAR BALLOON ANGIOPLASTY Left 12/01/2022   Procedure: PERIPHERAL VASCULAR BALLOON ANGIOPLASTY;  Surgeon: Gretta Lonni PARAS, MD;  Location: [facility] INVASIVE CV LAB;  Service: Cardiovascular;  Laterality: Left;  Anterior tibial   PERIPHERAL VASCULAR INTERVENTION Left 12/01/2022   Procedure: PERIPHERAL VASCULAR INTERVENTION;  Surgeon: Gretta Lonni PARAS, MD;  Location: [facility] INVASIVE CV LAB;  Service: Cardiovascular;  Laterality: Left;  sfa   VASCULAR SURGERY     FAMILY HISTORY Family History  Problem Relation Age of Onset   Gout Mother    Asthma Mother    Diabetes Father    Heart failure Father    Diabetes Sister    Hypertension Brother    Pancreatic cancer Brother    Diabetes Sister    SOCIAL HISTORY Social History   Tobacco Use   Smoking status: Never    Passive exposure: Past   Smokeless tobacco: Never  Vaping Use   Vaping status: Never Used  Substance Use Topics   Alcohol use: Not Currently    Comment: haven't drank in over 5 years    Drug use: No       OPHTHALMIC EXAM:  Base Eye Exam     Visual Acuity (Snellen - Linear)       Right Left   Dist [place] 20/25 -2 20/20 -1   Dist ph [place] 20/20 -2 --         Tonometry (Tonopen, 9:19 AM)       Right Left   Pressure 22 19         Pupils       Pupils Dark Light Shape React APD   Right PERRL 3 2 Round Brisk None   Left PERRL 3 2 Round Brisk None         Visual Fields       Left Right    Full Full         Extraocular Movement       Right Left    Full, Ortho Full, Ortho          Neuro/Psych     Oriented x3: Yes   Mood/Affect: Normal         Dilation     Both eyes: 1.0% Mydriacyl, 2.5% Phenylephrine  @ 9:20 AM           Slit Lamp and Fundus Exam     External Exam       Right Left   External Normal Normal         Slit Lamp Exam       Right Left   Lids/Lashes Dermatochalasis - upper lid, mild MGD Dermatochalasis - upper lid   Conjunctiva/Sclera Mild Melanosis Mild Melanosis   Cornea Arcus arcus, tear film debris   Anterior Chamber Deep and clear Deep and clear   Iris No NVI, Round and dilated No NVI, Round and dilated   Lens 2+ Nuclear sclerosis, 2-3+ Cortical cataract 2+ Nuclear sclerosis, 2-3+ Cortical cataract   Anterior Vitreous Vitreous syneresis Vitreous syneresis         Fundus Exam       Right Left   Disc Pink and sharp, mild PPA Pink and sharp, mild PPA   C/D Ratio 0.6 0.5   Macula Flat, good foveal reflex, central cystic changes, focal cluster of DBH just temporal to fovea w/ increased swelling, scattered MA/DBH greatest inferotemporal macula Flat, Blunted foveal reflex, central cystic changes -- improved, scattered MA/DBH, good focal laser changes, scattered non central cystic changes greatest IT mac   Vessels attenuated, Tortuous, A/V crossing changes attenuated, Tortuous   Periphery Attached, scattered MA/DBH Attached, scattered MA/DBH--greatest posteriorly           IMAGING AND PROCEDURES  Imaging and Procedures for 08/24/2024  OCT, Retina - OU - Both Eyes       Right Eye Quality was good. Central Foveal Thickness: 261. Progression has worsened. Findings include normal foveal contour, no SRF, intraretinal hyper-reflective material, intraretinal fluid, vitreomacular adhesion (  Stable improvement in central IRF and SRF, scattered punctate IRHM; mild interval increase in cystic changes temporal macula).   Left Eye Quality was good. Central Foveal Thickness: 272. Progression has worsened. Findings include  normal foveal contour, no SRF, intraretinal hyper-reflective material, intraretinal fluid, vitreomacular adhesion (Mild interval increase in IRF/cystic changes IT macula--stably improved centrally).   Notes *Images captured and stored on drive  Diagnosis / Impression:  OD: Stable improvement in central IRF and SRF, scattered punctate IRHM; mild interval increase in cystic changes temporal macula OS: Mild interval increase in IRF/cystic changes IT macula--stably improved centrally  Clinical management:  See below  Abbreviations: NFP - Normal foveal profile. CME - cystoid macular edema. PED - pigment epithelial detachment. IRF - intraretinal fluid. SRF - subretinal fluid. EZ - ellipsoid zone. ERM - epiretinal membrane. ORA - outer retinal atrophy. ORT - outer retinal tubulation. SRHM - subretinal hyper-reflective material. IRHM - intraretinal hyper-reflective material           ASSESSMENT/PLAN:   ICD-10-CM   1. Severe nonproliferative diabetic retinopathy of both eyes with macular edema associated with type 2 diabetes mellitus (HCC)  E11.3413 OCT, Retina - OU - Both Eyes    2. Encounter for long-term (current) use of insulin  (HCC)  Z79.4     3. Essential hypertension  I10     4. Hypertensive retinopathy of both eyes  H35.033     5. Cortical age-related cataract of both eyes  H25.013     6. Anxiety  F41.9      1,2. Severe Non-proliferative diabetic retinopathy, both eyes - last A1c 9.1 on 10.09.25, 9.5 (11.26.24); 7.4 (12.23.23) - s/p IVA OD #1 (08.28.24), #2 (09.25.24), #3 (10.23.24) - s/p focal laser OS (01.08.25) - FA (08.28.24) OD: Mild patches of capillary non perfusion peripherally, Late leaking MA, hyperfluorescence of disc, focal perifoveal hyperfluorescence leakage, no NV, OS: Mild patches of capillary non perfusion peripherally, Late leaking MA, no NV - BCVA OD 20/20--improved from 20/25, OS 20/20 - stable - OCT shows OD: Stable improvement in central IRF and SRF,  scattered punctate IRHM; mild interval increase in cystic changes temporal macula; OS: Mild interval increase in IRF/cystic changes IT macula--stably improved centrally - exam shows scattered MAs OS -- improved post focal laser - discussed possible IVA OU today, 10.29.25 -- pt wishes to defer tx and f/u in 2-3weeks - f/u in 2-3 weeks -- DFE/OCT, possible injection  3,4. Hypertensive retinopathy OU - discussed importance of tight BP control - monitor   5.  Mixed Cataract OU - The symptoms of cataract, surgical options, and treatments and risks were discussed with patient. - discussed diagnosis and progression - monitor   6. Anxiety  - affecting ophthalmic examinations and treatments - may benefit from mild anxiolytic prior to treatment visits (intravitreal injections and lasers)  - recommend discussing with primary care provider  Ophthalmic Meds Ordered this visit:  No orders of the defined types were placed in this encounter.    Return for 2-3 weeks NPDR OU, DFE, OCT, poss IVA OD.  There are no Patient Instructions on file for this visit.  Explained the diagnoses, plan, and follow up with the patient and they expressed understanding.  Patient expressed understanding of the importance of proper follow up care.   This document serves as a record of services personally performed by Redell JUDITHANN Hans, MD, PhD. It was created on their behalf by Almetta Pesa, an ophthalmic technician. The creation of this record is the provider's dictation and/or  activities during the visit.    Electronically signed by: Almetta Pesa, OA,  08/28/24  2:30 PM  Redell JUDITHANN Hans, M.D., Ph.D. Diseases & Surgery of the Retina and Vitreous [facility] 08/24/2024  I have reviewed the above documentation for accuracy and completeness, and I agree with the above. Redell JUDITHANN Hans, M.D., Ph.D. 08/28/24 2:35 PM   Abbreviations: M myopia (nearsighted); A astigmatism; H hyperopia  (farsighted); P presbyopia; Mrx spectacle prescription;  CTL contact lenses; OD right eye; OS left eye; OU both eyes  XT exotropia; ET esotropia; PEK punctate epithelial keratitis; PEE punctate epithelial erosions; DES dry eye syndrome; MGD meibomian gland dysfunction; ATs artificial tears; PFAT's preservative free artificial tears; NSC nuclear sclerotic cataract; PSC posterior subcapsular cataract; ERM epi-retinal membrane; PVD posterior vitreous detachment; RD retinal detachment; DM diabetes mellitus; DR diabetic retinopathy; NPDR non-proliferative diabetic retinopathy; PDR proliferative diabetic retinopathy; CSME clinically significant macular edema; DME diabetic macular edema; dbh dot blot hemorrhages; CWS cotton wool spot; POAG primary open angle glaucoma; C/D cup-to-disc ratio; HVF humphrey visual field; GVF goldmann visual field; OCT optical coherence tomography; IOP intraocular pressure; BRVO Branch retinal vein occlusion; CRVO central retinal vein occlusion; CRAO central retinal artery occlusion; BRAO branch retinal artery occlusion; RT retinal tear; SB scleral buckle; PPV pars plana vitrectomy; VH Vitreous hemorrhage; PRP panretinal laser photocoagulation; IVK intravitreal kenalog; VMT vitreomacular traction; MH Macular hole;  NVD neovascularization of the disc; NVE neovascularization elsewhere; AREDS age related eye disease study; ARMD age related macular degeneration; POAG primary open angle glaucoma; EBMD epithelial/anterior basement membrane dystrophy; ACIOL anterior chamber intraocular lens; IOL intraocular lens; PCIOL posterior chamber intraocular lens; Phaco/IOL phacoemulsification with intraocular lens placement; PRK photorefractive keratectomy; LASIK laser assisted in situ keratomileusis; HTN hypertension; DM diabetes mellitus; COPD chronic obstructive pulmonary disease

## 2024-08-16 NOTE — Care Management CC44 (Signed)
 Condition Code 44 Documentation Completed  Patient Details  Name: Kelly Key MRN: [id number] Date of Birth: 04/14/1971   Condition Code 44 given:  Yes Patient signature on Condition Code 44 notice:  Yes Documentation of 2 MD's agreement:  Yes Code 44 added to claim:  Yes    Corean ONEIDA Haddock, RN 08/16/2024, 1:22 PM

## 2024-08-16 NOTE — TOC Initial Note (Signed)
 Transition of Care [facility]) - Initial/Assessment Note    Patient Details  Name: Kelly Key MRN: [id number] Date of Birth: 1971-03-12  Transition of Care [facility]) CM/SW Contact:    Corean ONEIDA Haddock, RN Phone Number: 08/16/2024, 10:19 AM  Clinical Narrative:                  Patient with high risk for readmission score.  Please see assessment below from previous admission completed on 10/11  Patient is at risk for readmission . Patient was admitted for Acute respiratory failure with hypoxia. CSW spoke with patient at bedside in HD. Patient reports that it is him and his fiance in the home . They he is independent sometimes with her assistance. He reports that he has a cane and walker at home , but no oxygen. Patient reports that he is still able to drive and currently does not use or have any in-home services. CSW will continue to follow and assist with getting home oxygen if saturation qualification is accurate.      Transition of Care (TOC) Screening Note   Patient Details  Name: Kelly Key Date of Birth: 1970/12/09   Transition of Care [facility]) CM/SW Contact:    Corean ONEIDA Haddock, RN Phone Number: 08/16/2024, 10:20 AM    Transition of Care Department [facility]) has reviewed patient and no TOC needs have been identified at this time.  If new patient transition needs arise, please place a TOC consult.       Patient Goals and CMS Choice            Expected Discharge Plan and Services                                              Prior Living Arrangements/Services                       Activities of Daily Living   ADL Screening (condition at time of admission) Independently performs ADLs?: Yes (appropriate for developmental age) Is the patient deaf or have difficulty hearing?: No Does the patient have difficulty seeing, even when wearing glasses/contacts?: No Does the patient have difficulty concentrating, remembering, or making decisions?:  No  Permission Sought/Granted                  Emotional Assessment              Admission diagnosis:  Acute pulmonary edema (HCC) [J81.0] Hypervolemia, unspecified hypervolemia type [E87.70] CHF (congestive heart failure) (HCC) [I50.9] Patient Active Problem List   Diagnosis Date Noted   Class 3 obesity (HCC) 08/16/2024   Metabolic acidosis 08/16/2024   Anemia due to end stage renal disease (HCC) 08/16/2024   Acute pulmonary edema (HCC) 08/15/2024   CHF (congestive heart failure) (HCC) 08/15/2024   Acute hypoxemic respiratory failure (HCC) 08/06/2024   Acute respiratory failure (HCC) 08/06/2024   CAP (community acquired pneumonia) 08/06/2024   Acute exacerbation of chronic obstructive pulmonary disease (COPD) (HCC) 08/06/2024   Morbid obesity with BMI of 50.0-59.9, adult (HCC) 08/06/2024   Acute respiratory failure with hypoxia (HCC) 08/04/2024   ESRD (end stage renal disease) on dialysis (HCC) 05/06/2023   PAD (peripheral artery disease) 12/01/2022   GERD (gastroesophageal reflux disease) 11/27/2022   Dysphagia 10/18/2022   OSA (obstructive sleep apnea) 10/04/2021   Obesity hypoventilation syndrome (HCC)  10/04/2021   Organic impotence 08/16/2021   Hypertension secondary to other renal disorders 08/31/2020   Orthostasis    Pain    Physical debility 07/16/2019   Anemia of chronic disease    Uncontrolled type 2 diabetes mellitus with hyperglycemia (HCC)    Hypervolemia associated with renal insufficiency 07/05/2019   Anasarca 07/03/2019   Dyslipidemia 07/03/2019   Chronic combined systolic and diastolic CHF (congestive heart failure) (HCC) 07/03/2019   Bilateral leg edema    Uncontrolled type 2 diabetes mellitus with hyperglycemia, with long-term current use of insulin  (HCC) 06/22/2019   Acute on chronic diastolic CHF (congestive heart failure) (HCC) 03/26/2018   Hyponatremia 06/12/2012   Microcytic anemia 06/12/2012   Essential hypertension, benign 06/11/2012    Hepatic steatosis 06/11/2012   Super obesity 06/11/2012   PCP:  Luke Agent, MD (Inactive) Pharmacy:   [facility] [address] [place] - [address] [place] [address] Phone: [phone] Fax: [phone]     Social Drivers of Health (SDOH) Social History: SDOH Screenings   Food Insecurity: No Food Insecurity (08/15/2024)  Housing: Low Risk  (08/15/2024)  Transportation Needs: No Transportation Needs (08/15/2024)  Utilities: Not At Risk (08/15/2024)  Depression (PHQ2-9): Low Risk  (12/25/2021)  Financial Resource Strain: Low Risk  (02/20/2019)  Physical Activity: Unknown (02/20/2019)  Social Connections: Moderately Integrated (08/15/2024)  Stress: No Stress Concern Present (02/20/2019)  Tobacco Use: Low Risk  (08/15/2024)   SDOH Interventions:     Readmission Risk Interventions    08/16/2024   10:15 AM 08/04/2024   10:28 AM 09/29/2023   11:45 AM  Readmission Risk Prevention Plan  Transportation Screening Complete Complete Complete  PCP or Specialist Appt within 3-5 Days   Complete  HRI or Home Care Consult  Complete Complete  Social Work Consult for Recovery Care Planning/Counseling  Complete Complete  Palliative Care Screening  Not Applicable Complete  Medication Review Oceanographer) Complete Complete Complete  Palliative Care Screening Not Applicable    Skilled Nursing Facility Not Applicable

## 2024-08-16 NOTE — Care Management Obs Status (Signed)
 MEDICARE OBSERVATION STATUS NOTIFICATION   Patient Details  Name: Kelly Key MRN: [id number] Date of Birth: July 04, 1971   Medicare Observation Status Notification Given:  Yes    Corean ONEIDA Haddock, RN 08/16/2024, 1:21 PM

## 2024-08-16 NOTE — Progress Notes (Addendum)
 D/C order noted. Contacted [facility] on TTS 1st shift to advise of pt d/c. Requested documents faxed to clinic for continuation of care.  Suzen Satchel Dialysis Navigator [phone]

## 2024-08-16 NOTE — Progress Notes (Signed)
 [facility]  ROUNDING NOTE   Subjective:   Kelly Key is a 53 year old male with past medical conditions including hypertension, diabetes, COPD, PAD, and end-stage renal disease on hemodialysis.  Patient presents to the emergency department complaining of shortness of breath and has been admitted for Acute pulmonary edema (HCC) [J81.0] Hypervolemia, unspecified hypervolemia type [E87.70] CHF (congestive heart failure) (HCC) [I50.9]  Patient is known to our practice and receives outpatient dialysis treatments at [facility] on a TTS schedule, supervised by Dr. Marcelino.    Update Patient seen and evaluated during dialysis   HEMODIALYSIS FLOWSHEET:  Blood Flow Rate (mL/min): 399 mL/min Arterial Pressure (mmHg): -134.74 mmHg Venous Pressure (mmHg): 231.71 mmHg TMP (mmHg): -6.87 mmHg Ultrafiltration Rate (mL/min): 1229 mL/min Dialysate Flow Rate (mL/min): 49 ml/min Dialysis Fluid Bolus: Normal Saline  Resting quietly during treatment Denies shortness of breath   Objective:  Vital signs in last 24 hours:  Temp:  [96.7 F (35.9 C)-98.3 F (36.8 C)] 97.7 F (36.5 C) (10/21 0806) Pulse Rate:  [65-86] 73 (10/21 0856) Resp:  [11-18] 16 (10/21 0856) BP: (108-177)/(64-91) 150/82 (10/21 0856) SpO2:  [97 %-100 %] 100 % (10/21 0856) Weight:  [158.2 kg-158.5 kg] 158.5 kg (10/21 0806)  Weight change: 8.077 kg Filed Weights   08/15/24 0809 08/15/24 1300 08/16/24 0806  Weight: (!) 162.3 kg (!) 158.2 kg (!) 158.5 kg    Intake/Output: I/O last 3 completed shifts: In: 240 [P.O.:240] Out: 3500 [Other:3500]   Intake/Output this shift:  No intake/output data recorded.  Physical Exam: General: NAD  Head: Normocephalic, atraumatic. Moist oral mucosal membranes  Eyes: Anicteric  Lungs:  Basilar rales, mild tachypnea  Heart: Regular rate and rhythm  Abdomen:  Soft, nontender  Extremities: 2-3+ peripheral edema.  Neurologic: Awake, alert, conversant  Skin: Warm,dry, no  rash  Access: Left upper aVF    Basic Metabolic Panel: Recent Labs  Lab 08/15/24 0312  NA 132*  K 4.5  CL 90*  CO2 23  GLUCOSE 426*  BUN 88*  CREATININE 9.44*  CALCIUM  7.8*    Liver Function Tests: No results for input(s): AST, ALT, ALKPHOS, BILITOT, PROT, ALBUMIN  in the last 168 hours. No results for input(s): LIPASE, AMYLASE in the last 168 hours. No results for input(s): AMMONIA in the last 168 hours.  CBC: Recent Labs  Lab 08/15/24 0312  WBC 9.5  HGB 8.3*  HCT 25.8*  MCV 91.8  PLT 229    Cardiac Enzymes: No results for input(s): CKTOTAL, CKMB, CKMBINDEX, TROPONINI in the last 168 hours.  BNP: Invalid input(s): POCBNP  CBG: Recent Labs  Lab 08/15/24 1350 08/15/24 1701 08/15/24 2047 08/16/24 0030 08/16/24 0738  GLUCAP 186* 385* 411* 289* 180*    Microbiology: Results for orders placed or performed during the hospital encounter of 08/06/24  Resp panel by RT-PCR (RSV, Flu A&B, Covid) Anterior Nasal Swab     Status: None   Collection Time: 08/06/24  5:00 AM   Specimen: Anterior Nasal Swab  Result Value Ref Range Status   SARS Coronavirus 2 by RT PCR NEGATIVE NEGATIVE Final    Comment: (NOTE) SARS-CoV-2 target nucleic acids are NOT DETECTED.  The SARS-CoV-2 RNA is generally detectable in upper respiratory specimens during the acute phase of infection. The lowest concentration of SARS-CoV-2 viral copies this assay can detect is 138 copies/mL. A negative result does not preclude SARS-Cov-2 infection and should not be used as the sole basis for treatment or other patient management decisions. A negative result may  occur with  improper specimen collection/handling, submission of specimen other than nasopharyngeal swab, presence of viral mutation(s) within the areas targeted by this assay, and inadequate number of viral copies(<138 copies/mL). A negative result must be combined with clinical observations, patient history, and  epidemiological information. The expected result is Negative.  Fact Sheet for Patients:  BloggerCourse.com  Fact Sheet for Healthcare Providers:  SeriousBroker.it  This test is no t yet approved or cleared by the [place]  FDA and  has been authorized for detection and/or diagnosis of SARS-CoV-2 by FDA under an Emergency Use Authorization (EUA). This EUA will remain  in effect (meaning this test can be used) for the duration of the COVID-19 declaration under Section 564(b)(1) of the Act, 21 U.S.C.section 360bbb-3(b)(1), unless the authorization is terminated  or revoked sooner.       Influenza A by PCR NEGATIVE NEGATIVE Final   Influenza B by PCR NEGATIVE NEGATIVE Final    Comment: (NOTE) The Xpert Xpress SARS-CoV-2/FLU/RSV plus assay is intended as an aid in the diagnosis of influenza from Nasopharyngeal swab specimens and should not be used as a sole basis for treatment. Nasal washings and aspirates are unacceptable for Xpert Xpress SARS-CoV-2/FLU/RSV testing.  Fact Sheet for Patients: BloggerCourse.com  Fact Sheet for Healthcare Providers: SeriousBroker.it  This test is not yet approved or cleared by the [place]  FDA and has been authorized for detection and/or diagnosis of SARS-CoV-2 by FDA under an Emergency Use Authorization (EUA). This EUA will remain in effect (meaning this test can be used) for the duration of the COVID-19 declaration under Section 564(b)(1) of the Act, 21 U.S.C. section 360bbb-3(b)(1), unless the authorization is terminated or revoked.     Resp Syncytial Virus by PCR NEGATIVE NEGATIVE Final    Comment: (NOTE) Fact Sheet for Patients: BloggerCourse.com  Fact Sheet for Healthcare Providers: SeriousBroker.it  This test is not yet approved or cleared by the [place]  FDA and has been  authorized for detection and/or diagnosis of SARS-CoV-2 by FDA under an Emergency Use Authorization (EUA). This EUA will remain in effect (meaning this test can be used) for the duration of the COVID-19 declaration under Section 564(b)(1) of the Act, 21 U.S.C. section 360bbb-3(b)(1), unless the authorization is terminated or revoked.  Performed at [facility], [address] [place], [place] [address]     Coagulation Studies: No results for input(s): LABPROT, INR in the last 72 hours.  Urinalysis: No results for input(s): COLORURINE, LABSPEC, PHURINE, GLUCOSEU, HGBUR, BILIRUBINUR, KETONESUR, PROTEINUR, UROBILINOGEN, NITRITE, LEUKOCYTESUR in the last 72 hours.  Invalid input(s): APPERANCEUR    Imaging: DG Chest 2 View Result Date: 08/15/2024 CLINICAL DATA:  Initial evaluation for acute shortness of breath. EXAM: CHEST - 2 VIEW COMPARISON:  Prior radiograph from 08/06/2024 FINDINGS: Cardiomegaly, stable. Mediastinal silhouette within normal limits. Aortic atherosclerosis. Lungs normally inflated. Perihilar vascular congestion with diffuse interstitial prominence, consistent with pulmonary interstitial congestion/edema. No pleural effusion. No visible focal infiltrates. No pneumothorax. Visualized soft tissues and osseous structures demonstrate no acute finding. Mild thoracic scoliosis noted. IMPRESSION: Cardiomegaly with pulmonary interstitial congestion/edema, consistent with CHF. Electronically Signed   By: Morene Hoard M.D.   On: 08/15/2024 04:06     Medications:    sodium chloride       allopurinol   200 mg Oral Daily   amLODipine   10 mg Oral Daily   aspirin  EC  81 mg Oral Daily   atorvastatin   80 mg Oral Daily   carvedilol   25 mg Oral BID WC  Chlorhexidine  Gluconate Cloth  6 each Topical Q0600   cloNIDine   0.3 mg Oral TID   clopidogrel   75 mg Oral Daily   ezetimibe   10 mg Oral QPM   furosemide   80 mg Oral Daily   gabapentin   100 mg Oral  TID   heparin   5,000 Units Subcutaneous Q12H   insulin  aspart  0-20 Units Subcutaneous TID WC   insulin  aspart  0-5 Units Subcutaneous QHS   insulin  glargine-yfgn  30 Units Subcutaneous QHS   isosorbide  mononitrate  30 mg Oral Q supper   linagliptin  5 mg Oral Daily   pantoprazole   40 mg Oral Daily   sodium chloride  flush  3 mL Intravenous Q12H   spironolactone  25 mg Oral Daily   sucroferric oxyhydroxide  500 mg Oral TID   umeclidinium-vilanterol  1 puff Inhalation Daily   sodium chloride , acetaminophen , albuterol , alteplase , fluticasone , heparin , heparin , lidocaine -prilocaine , lidocaine -prilocaine , linaclotide , ondansetron  (ZOFRAN ) IV, pentafluoroprop-tetrafluoroeth, pentafluoroprop-tetrafluoroeth, sodium chloride  flush  Assessment/ Plan:  Kelly Key is a 53 y.o.  male with past medical conditions including hypertension, diabetes, COPD, PAD, and end-stage renal disease on hemodialysis.  Patient presents to the emergency department complaining of shortness of breath and has been admitted for Acute pulmonary edema (HCC) [J81.0] Hypervolemia, unspecified hypervolemia type [E87.70] CHF (congestive heart failure) (HCC) [I50.9]  [facility]/TTS/left upper AVF  End-stage renal disease on hemodialysis.  Dialysis received yesterday, UF 3.5L achieved. Receiving UF only treatment today, UF goal 4L as tolerated. Next treatment scheduled for Wednesday.   2.  Acute respiratory failure, due to increased fluid intake.  Patient reports been 11 kg over dry weight at dialysis Saturday.  Chest x-ray suggestive of pulmonary congestion/edema, consistent with CHF.  Respiratory status has improved. Will continue to remove fluid with dialysis.   3. Anemia of chronic kidney disease Lab Results  Component Value Date   HGB 8.3 (L) 08/15/2024    Patient does receive Mircera at outpatient clinic.  Will consider Retacrit  with scheduled dialysis tomorrow.   4. Secondary Hyperparathyroidism: with  outpatient labs: None available Lab Results  Component Value Date   CALCIUM  7.8 (L) 08/15/2024   CAION 1.10 (L) 11/11/2019   PHOS 6.9 (H) 08/08/2024    Will continue to monitor bone minerals during ts admission. Continue Velphoro  with meals.    LOS: 1 Requan Hardge 10/21/20259:35 AM

## 2024-08-21 ENCOUNTER — Emergency Department

## 2024-08-21 ENCOUNTER — Other Ambulatory Visit: Payer: Self-pay

## 2024-08-21 DIAGNOSIS — Z7902 Long term (current) use of antithrombotics/antiplatelets: Secondary | ICD-10-CM

## 2024-08-21 DIAGNOSIS — I132 Hypertensive heart and chronic kidney disease with heart failure and with stage 5 chronic kidney disease, or end stage renal disease: Principal | ICD-10-CM | POA: Diagnosis present

## 2024-08-21 DIAGNOSIS — E66813 Obesity, class 3: Secondary | ICD-10-CM | POA: Diagnosis present

## 2024-08-21 DIAGNOSIS — E1165 Type 2 diabetes mellitus with hyperglycemia: Secondary | ICD-10-CM | POA: Diagnosis present

## 2024-08-21 DIAGNOSIS — Z7982 Long term (current) use of aspirin: Secondary | ICD-10-CM

## 2024-08-21 DIAGNOSIS — Z825 Family history of asthma and other chronic lower respiratory diseases: Secondary | ICD-10-CM

## 2024-08-21 DIAGNOSIS — I050 Rheumatic mitral stenosis: Secondary | ICD-10-CM | POA: Diagnosis present

## 2024-08-21 DIAGNOSIS — E1151 Type 2 diabetes mellitus with diabetic peripheral angiopathy without gangrene: Secondary | ICD-10-CM | POA: Diagnosis present

## 2024-08-21 DIAGNOSIS — E785 Hyperlipidemia, unspecified: Secondary | ICD-10-CM | POA: Diagnosis present

## 2024-08-21 DIAGNOSIS — I160 Hypertensive urgency: Secondary | ICD-10-CM | POA: Diagnosis present

## 2024-08-21 DIAGNOSIS — E1122 Type 2 diabetes mellitus with diabetic chronic kidney disease: Secondary | ICD-10-CM | POA: Diagnosis present

## 2024-08-21 DIAGNOSIS — Z79899 Other long term (current) drug therapy: Secondary | ICD-10-CM

## 2024-08-21 DIAGNOSIS — Z800 Family history of malignant neoplasm of digestive organs: Secondary | ICD-10-CM

## 2024-08-21 DIAGNOSIS — J449 Chronic obstructive pulmonary disease, unspecified: Secondary | ICD-10-CM | POA: Diagnosis present

## 2024-08-21 DIAGNOSIS — J810 Acute pulmonary edema: Secondary | ICD-10-CM | POA: Diagnosis not present

## 2024-08-21 DIAGNOSIS — R0600 Dyspnea, unspecified: Secondary | ICD-10-CM | POA: Diagnosis present

## 2024-08-21 DIAGNOSIS — Z888 Allergy status to other drugs, medicaments and biological substances status: Secondary | ICD-10-CM

## 2024-08-21 DIAGNOSIS — Z91158 Patient's noncompliance with renal dialysis for other reason: Secondary | ICD-10-CM

## 2024-08-21 DIAGNOSIS — I2510 Atherosclerotic heart disease of native coronary artery without angina pectoris: Secondary | ICD-10-CM | POA: Diagnosis present

## 2024-08-21 DIAGNOSIS — Z6841 Body Mass Index (BMI) 40.0 and over, adult: Secondary | ICD-10-CM

## 2024-08-21 DIAGNOSIS — I5033 Acute on chronic diastolic (congestive) heart failure: Secondary | ICD-10-CM | POA: Diagnosis present

## 2024-08-21 DIAGNOSIS — Z833 Family history of diabetes mellitus: Secondary | ICD-10-CM

## 2024-08-21 DIAGNOSIS — Z992 Dependence on renal dialysis: Secondary | ICD-10-CM

## 2024-08-21 DIAGNOSIS — D631 Anemia in chronic kidney disease: Secondary | ICD-10-CM | POA: Diagnosis present

## 2024-08-21 DIAGNOSIS — N186 End stage renal disease: Secondary | ICD-10-CM | POA: Diagnosis present

## 2024-08-21 DIAGNOSIS — G4733 Obstructive sleep apnea (adult) (pediatric): Secondary | ICD-10-CM | POA: Diagnosis present

## 2024-08-21 DIAGNOSIS — Z8249 Family history of ischemic heart disease and other diseases of the circulatory system: Secondary | ICD-10-CM

## 2024-08-21 DIAGNOSIS — I252 Old myocardial infarction: Secondary | ICD-10-CM

## 2024-08-21 DIAGNOSIS — N2581 Secondary hyperparathyroidism of renal origin: Secondary | ICD-10-CM | POA: Diagnosis present

## 2024-08-21 NOTE — ED Triage Notes (Signed)
 Patient to triage via wheelchair with complaints of worsening shortness of breath after getting up and going to the bathroom. Also endorses some chest pain. Patient states he tried his inhaler without relief and believes he may need oxygen at home. Patient receives dialysis T, TH, Sat and has not missed any.

## 2024-08-22 ENCOUNTER — Inpatient Hospital Stay
Admission: EM | Admit: 2024-08-22 | Discharge: 2024-08-23 | DRG: 291 | Disposition: A | Discharge status: Home / Self Care | Attending: Obstetrics and Gynecology | Admitting: Obstetrics and Gynecology

## 2024-08-22 ENCOUNTER — Encounter: Payer: Self-pay | Admitting: Internal Medicine

## 2024-08-22 ENCOUNTER — Inpatient Hospital Stay
Admit: 2024-08-22 | Discharge: 2024-08-22 | Disposition: A | Attending: Obstetrics and Gynecology | Admitting: Obstetrics and Gynecology

## 2024-08-22 DIAGNOSIS — E1165 Type 2 diabetes mellitus with hyperglycemia: Secondary | ICD-10-CM | POA: Diagnosis present

## 2024-08-22 DIAGNOSIS — Z79899 Other long term (current) drug therapy: Secondary | ICD-10-CM | POA: Diagnosis not present

## 2024-08-22 DIAGNOSIS — N2581 Secondary hyperparathyroidism of renal origin: Secondary | ICD-10-CM | POA: Diagnosis present

## 2024-08-22 DIAGNOSIS — I10 Essential (primary) hypertension: Secondary | ICD-10-CM | POA: Diagnosis present

## 2024-08-22 DIAGNOSIS — E1122 Type 2 diabetes mellitus with diabetic chronic kidney disease: Secondary | ICD-10-CM | POA: Diagnosis present

## 2024-08-22 DIAGNOSIS — E66813 Obesity, class 3: Secondary | ICD-10-CM | POA: Diagnosis present

## 2024-08-22 DIAGNOSIS — Z6841 Body Mass Index (BMI) 40.0 and over, adult: Secondary | ICD-10-CM | POA: Diagnosis not present

## 2024-08-22 DIAGNOSIS — E1151 Type 2 diabetes mellitus with diabetic peripheral angiopathy without gangrene: Secondary | ICD-10-CM | POA: Diagnosis present

## 2024-08-22 DIAGNOSIS — Z992 Dependence on renal dialysis: Secondary | ICD-10-CM | POA: Diagnosis not present

## 2024-08-22 DIAGNOSIS — I160 Hypertensive urgency: Secondary | ICD-10-CM | POA: Diagnosis present

## 2024-08-22 DIAGNOSIS — I2510 Atherosclerotic heart disease of native coronary artery without angina pectoris: Secondary | ICD-10-CM | POA: Diagnosis present

## 2024-08-22 DIAGNOSIS — E785 Hyperlipidemia, unspecified: Secondary | ICD-10-CM | POA: Diagnosis present

## 2024-08-22 DIAGNOSIS — I5033 Acute on chronic diastolic (congestive) heart failure: Secondary | ICD-10-CM | POA: Diagnosis present

## 2024-08-22 DIAGNOSIS — N186 End stage renal disease: Secondary | ICD-10-CM | POA: Diagnosis present

## 2024-08-22 DIAGNOSIS — I132 Hypertensive heart and chronic kidney disease with heart failure and with stage 5 chronic kidney disease, or end stage renal disease: Secondary | ICD-10-CM | POA: Diagnosis present

## 2024-08-22 DIAGNOSIS — I5031 Acute diastolic (congestive) heart failure: Secondary | ICD-10-CM | POA: Diagnosis not present

## 2024-08-22 DIAGNOSIS — I739 Peripheral vascular disease, unspecified: Secondary | ICD-10-CM | POA: Diagnosis present

## 2024-08-22 DIAGNOSIS — J810 Acute pulmonary edema: Secondary | ICD-10-CM | POA: Diagnosis present

## 2024-08-22 DIAGNOSIS — E662 Morbid (severe) obesity with alveolar hypoventilation: Secondary | ICD-10-CM | POA: Diagnosis present

## 2024-08-22 DIAGNOSIS — D631 Anemia in chronic kidney disease: Secondary | ICD-10-CM | POA: Diagnosis present

## 2024-08-22 DIAGNOSIS — Z833 Family history of diabetes mellitus: Secondary | ICD-10-CM | POA: Diagnosis not present

## 2024-08-22 DIAGNOSIS — Z91158 Patient's noncompliance with renal dialysis for other reason: Secondary | ICD-10-CM | POA: Diagnosis not present

## 2024-08-22 DIAGNOSIS — Z8249 Family history of ischemic heart disease and other diseases of the circulatory system: Secondary | ICD-10-CM | POA: Diagnosis not present

## 2024-08-22 DIAGNOSIS — I509 Heart failure, unspecified: Secondary | ICD-10-CM

## 2024-08-22 DIAGNOSIS — Z888 Allergy status to other drugs, medicaments and biological substances status: Secondary | ICD-10-CM | POA: Diagnosis not present

## 2024-08-22 DIAGNOSIS — Z7902 Long term (current) use of antithrombotics/antiplatelets: Secondary | ICD-10-CM | POA: Diagnosis not present

## 2024-08-22 DIAGNOSIS — Z7982 Long term (current) use of aspirin: Secondary | ICD-10-CM | POA: Diagnosis not present

## 2024-08-22 DIAGNOSIS — G4733 Obstructive sleep apnea (adult) (pediatric): Secondary | ICD-10-CM | POA: Diagnosis present

## 2024-08-22 DIAGNOSIS — I050 Rheumatic mitral stenosis: Secondary | ICD-10-CM | POA: Insufficient documentation

## 2024-08-22 DIAGNOSIS — J449 Chronic obstructive pulmonary disease, unspecified: Secondary | ICD-10-CM | POA: Diagnosis present

## 2024-08-22 DIAGNOSIS — I5042 Chronic combined systolic (congestive) and diastolic (congestive) heart failure: Secondary | ICD-10-CM | POA: Diagnosis present

## 2024-08-22 LAB — BASIC METABOLIC PANEL WITH GFR
Anion gap: 12 (ref 5–15)
BUN: 75 mg/dL — ABNORMAL HIGH (ref 6–20)
CO2: 27 mmol/L (ref 22–32)
Calcium: 8.5 mg/dL — ABNORMAL LOW (ref 8.9–10.3)
Chloride: 94 mmol/L — ABNORMAL LOW (ref 98–111)
Creatinine, Ser: 11.67 mg/dL — ABNORMAL HIGH (ref 0.61–1.24)
GFR, Estimated: 5 mL/min — ABNORMAL LOW (ref >60)
Glucose, Bld: 334 mg/dL — ABNORMAL HIGH (ref 70–99)
Potassium: 5.1 mmol/L (ref 3.5–5.1)
Sodium: 133 mmol/L — ABNORMAL LOW (ref 135–145)

## 2024-08-22 LAB — RENAL FUNCTION PANEL
Albumin: 3.2 g/dL — ABNORMAL LOW (ref 3.5–5.0)
Anion gap: 13 (ref 5–15)
BUN: 81 mg/dL — ABNORMAL HIGH (ref 6–20)
CO2: 24 mmol/L (ref 22–32)
Calcium: 8.4 mg/dL — ABNORMAL LOW (ref 8.9–10.3)
Chloride: 94 mmol/L — ABNORMAL LOW (ref 98–111)
Creatinine, Ser: 12.22 mg/dL — ABNORMAL HIGH (ref 0.61–1.24)
GFR, Estimated: 4 mL/min — ABNORMAL LOW (ref >60)
Glucose, Bld: 260 mg/dL — ABNORMAL HIGH (ref 70–99)
Phosphorus: 4.4 mg/dL (ref 2.5–4.6)
Potassium: 5.3 mmol/L — ABNORMAL HIGH (ref 3.5–5.1)
Sodium: 131 mmol/L — ABNORMAL LOW (ref 135–145)

## 2024-08-22 LAB — CBC
HCT: 24.2 % — ABNORMAL LOW (ref 39.0–52.0)
HCT: 26.4 % — ABNORMAL LOW (ref 39.0–52.0)
Hemoglobin: 7.8 g/dL — ABNORMAL LOW (ref 13.0–17.0)
Hemoglobin: 8.3 g/dL — ABNORMAL LOW (ref 13.0–17.0)
MCH: 29.3 pg (ref 26.0–34.0)
MCH: 29.5 pg (ref 26.0–34.0)
MCHC: 31.4 g/dL (ref 30.0–36.0)
MCHC: 32.2 g/dL (ref 30.0–36.0)
MCV: 91.7 fL (ref 80.0–100.0)
MCV: 93.3 fL (ref 80.0–100.0)
Platelets: 233 K/uL (ref 150–400)
Platelets: 233 K/uL (ref 150–400)
RBC: 2.64 MIL/uL — ABNORMAL LOW (ref 4.22–5.81)
RBC: 2.83 MIL/uL — ABNORMAL LOW (ref 4.22–5.81)
RDW: 15.5 % (ref 11.5–15.5)
RDW: 15.5 % (ref 11.5–15.5)
WBC: 9.1 K/uL (ref 4.0–10.5)
WBC: 9.8 K/uL (ref 4.0–10.5)
nRBC: 0 % (ref 0.0–0.2)
nRBC: 0 % (ref 0.0–0.2)

## 2024-08-22 LAB — ECHOCARDIOGRAM COMPLETE
AR max vel: 2.36 cm2
AV Area VTI: 2.62 cm2
AV Area mean vel: 2.32 cm2
AV Mean grad: 5 mmHg
AV Peak grad: 8.8 mmHg
Ao pk vel: 1.48 m/s
Area-P 1/2: 3.48 cm2
Calc EF: 56.5 %
MV VTI: 1.63 cm2
S' Lateral: 2.9 cm
Single Plane A2C EF: 58.9 %
Single Plane A4C EF: 58.4 %
Weight: 5407.44 [oz_av]

## 2024-08-22 LAB — BRAIN NATRIURETIC PEPTIDE: B Natriuretic Peptide: 538.1 pg/mL — ABNORMAL HIGH (ref 0.0–100.0)

## 2024-08-22 LAB — GLUCOSE, CAPILLARY
Glucose-Capillary: 178 mg/dL — ABNORMAL HIGH (ref 70–99)
Glucose-Capillary: 303 mg/dL — ABNORMAL HIGH (ref 70–99)
Glucose-Capillary: 310 mg/dL — ABNORMAL HIGH (ref 70–99)

## 2024-08-22 LAB — CREATININE, SERUM
Creatinine, Ser: 12.05 mg/dL — ABNORMAL HIGH (ref 0.61–1.24)
GFR, Estimated: 4 mL/min — ABNORMAL LOW (ref >60)

## 2024-08-22 LAB — TROPONIN I (HIGH SENSITIVITY)
Troponin I (High Sensitivity): 25 ng/L — ABNORMAL HIGH (ref <18)
Troponin I (High Sensitivity): 25 ng/L — ABNORMAL HIGH (ref <18)

## 2024-08-22 MED ORDER — FUROSEMIDE 40 MG PO TABS
80.0000 mg | ORAL_TABLET | Freq: Every day | ORAL | Status: DC
  Administered 2024-08-23: 80 mg via ORAL
  Filled 2024-08-22: qty 2

## 2024-08-22 MED ORDER — INSULIN ASPART 100 UNIT/ML IJ SOLN
0.0000 [IU] | Freq: Every day | INTRAMUSCULAR | Status: DC
  Administered 2024-08-22: 4 [IU] via SUBCUTANEOUS
  Filled 2024-08-22: qty 1

## 2024-08-22 MED ORDER — NITROGLYCERIN 2 % TD OINT
1.0000 [in_us] | TOPICAL_OINTMENT | Freq: Once | TRANSDERMAL | Status: AC
Start: 2024-08-22 — End: 2024-08-22
  Administered 2024-08-22: 1 [in_us] via TOPICAL
  Filled 2024-08-22: qty 1

## 2024-08-22 MED ORDER — EZETIMIBE 10 MG PO TABS
10.0000 mg | ORAL_TABLET | Freq: Every evening | ORAL | Status: DC
  Administered 2024-08-22: 10 mg via ORAL
  Filled 2024-08-22: qty 1

## 2024-08-22 MED ORDER — ALLOPURINOL 100 MG PO TABS
200.0000 mg | ORAL_TABLET | Freq: Every day | ORAL | Status: DC
Start: 2024-08-22 — End: 2024-08-23
  Administered 2024-08-22 – 2024-08-23 (×2): 200 mg via ORAL
  Filled 2024-08-22 (×2): qty 2

## 2024-08-22 MED ORDER — ACETAMINOPHEN 650 MG RE SUPP: 650.0000 mg | Freq: Four times a day (QID) | RECTAL | Status: DC | PRN

## 2024-08-22 MED ORDER — ISOSORBIDE MONONITRATE ER 30 MG PO TB24
15.0000 mg | ORAL_TABLET | Freq: Every day | ORAL | Status: DC
  Administered 2024-08-22: 15 mg via ORAL
  Filled 2024-08-22: qty 1

## 2024-08-22 MED ORDER — ALBUTEROL SULFATE (2.5 MG/3ML) 0.083% IN NEBU
2.5000 mg | INHALATION_SOLUTION | RESPIRATORY_TRACT | Status: DC | PRN
  Administered 2024-08-23: 2.5 mg via RESPIRATORY_TRACT
  Filled 2024-08-22: qty 3

## 2024-08-22 MED ORDER — FUROSEMIDE 10 MG/ML IJ SOLN
80.0000 mg | Freq: Once | INTRAMUSCULAR | Status: AC
Start: 2024-08-22 — End: 2024-08-22
  Administered 2024-08-22: 80 mg via INTRAVENOUS
  Filled 2024-08-22: qty 8

## 2024-08-22 MED ORDER — ORAL CARE MOUTH RINSE: 15.0000 mL | OROMUCOSAL | Status: DC | PRN

## 2024-08-22 MED ORDER — ASPIRIN 81 MG PO TBEC
81.0000 mg | DELAYED_RELEASE_TABLET | Freq: Every day | ORAL | Status: DC
  Administered 2024-08-22 – 2024-08-23 (×2): 81 mg via ORAL
  Filled 2024-08-22 (×2): qty 1

## 2024-08-22 MED ORDER — CLONIDINE HCL 0.1 MG PO TABS
0.3000 mg | ORAL_TABLET | Freq: Three times a day (TID) | ORAL | Status: DC
  Administered 2024-08-22 – 2024-08-23 (×4): 0.3 mg via ORAL
  Filled 2024-08-22 (×4): qty 3

## 2024-08-22 MED ORDER — INSULIN GLARGINE-YFGN 100 UNIT/ML ~~LOC~~ SOLN
30.0000 [IU] | Freq: Every day | SUBCUTANEOUS | Status: DC
  Administered 2024-08-22: 30 [IU] via SUBCUTANEOUS
  Filled 2024-08-22 (×2): qty 0.3

## 2024-08-22 MED ORDER — CLOPIDOGREL BISULFATE 75 MG PO TABS
75.0000 mg | ORAL_TABLET | Freq: Every day | ORAL | Status: DC
  Administered 2024-08-22 – 2024-08-23 (×2): 75 mg via ORAL
  Filled 2024-08-22 (×2): qty 1

## 2024-08-22 MED ORDER — ATORVASTATIN CALCIUM 80 MG PO TABS
80.0000 mg | ORAL_TABLET | Freq: Every day | ORAL | Status: DC
  Administered 2024-08-22 – 2024-08-23 (×2): 80 mg via ORAL
  Filled 2024-08-22 (×2): qty 1

## 2024-08-22 MED ORDER — ONDANSETRON HCL 4 MG/2ML IJ SOLN: 4.0000 mg | Freq: Four times a day (QID) | INTRAMUSCULAR | Status: DC | PRN

## 2024-08-22 MED ORDER — HEPARIN SODIUM (PORCINE) 5000 UNIT/ML IJ SOLN
5000.0000 [IU] | Freq: Three times a day (TID) | INTRAMUSCULAR | Status: DC
Start: 2024-08-22 — End: 2024-08-23
  Administered 2024-08-22 – 2024-08-23 (×4): 5000 [IU] via SUBCUTANEOUS
  Filled 2024-08-22 (×4): qty 1

## 2024-08-22 MED ORDER — CARVEDILOL 25 MG PO TABS
25.0000 mg | ORAL_TABLET | Freq: Two times a day (BID) | ORAL | Status: DC
  Administered 2024-08-22: 25 mg via ORAL
  Filled 2024-08-22: qty 1

## 2024-08-22 MED ORDER — DEXTROSE 5 % IV SOLN
120.0000 mg | Freq: Once | INTRAVENOUS | Status: AC
  Administered 2024-08-22: 120 mg via INTRAVENOUS
  Filled 2024-08-22: qty 10

## 2024-08-22 MED ORDER — AMLODIPINE BESYLATE 10 MG PO TABS
10.0000 mg | ORAL_TABLET | Freq: Every day | ORAL | Status: DC
  Administered 2024-08-22 – 2024-08-23 (×2): 10 mg via ORAL
  Filled 2024-08-22 (×2): qty 1

## 2024-08-22 MED ORDER — SPIRONOLACTONE 25 MG PO TABS
25.0000 mg | ORAL_TABLET | Freq: Every day | ORAL | Status: DC
  Administered 2024-08-22 – 2024-08-23 (×2): 25 mg via ORAL
  Filled 2024-08-22 (×2): qty 1

## 2024-08-22 MED ORDER — LINACLOTIDE 145 MCG PO CAPS: 145.0000 ug | ORAL_CAPSULE | Freq: Every day | ORAL | Status: DC | PRN

## 2024-08-22 MED ORDER — LABETALOL HCL 5 MG/ML IV SOLN: 10.0000 mg | INTRAVENOUS | Status: DC | PRN

## 2024-08-22 MED ORDER — ENOXAPARIN SODIUM 40 MG/0.4ML IJ SOSY: 40.0000 mg | PREFILLED_SYRINGE | INTRAMUSCULAR | Status: DC

## 2024-08-22 MED ORDER — FUROSEMIDE 10 MG/ML IJ SOLN: 40.0000 mg | Freq: Two times a day (BID) | INTRAMUSCULAR | Status: DC

## 2024-08-22 MED ORDER — GABAPENTIN 100 MG PO CAPS
100.0000 mg | ORAL_CAPSULE | Freq: Three times a day (TID) | ORAL | Status: DC
  Administered 2024-08-22 – 2024-08-23 (×3): 100 mg via ORAL
  Filled 2024-08-22 (×3): qty 1

## 2024-08-22 MED ORDER — ONDANSETRON HCL 4 MG PO TABS: 4.0000 mg | ORAL_TABLET | Freq: Four times a day (QID) | ORAL | Status: DC | PRN

## 2024-08-22 MED ORDER — ACETAMINOPHEN 325 MG PO TABS
650.0000 mg | ORAL_TABLET | Freq: Four times a day (QID) | ORAL | Status: DC | PRN
  Administered 2024-08-22 – 2024-08-23 (×2): 650 mg via ORAL
  Filled 2024-08-22 (×2): qty 2

## 2024-08-22 MED ORDER — INSULIN ASPART 100 UNIT/ML IJ SOLN
0.0000 [IU] | Freq: Three times a day (TID) | INTRAMUSCULAR | Status: DC
  Administered 2024-08-22: 4 [IU] via SUBCUTANEOUS
  Administered 2024-08-22: 1 [IU] via SUBCUTANEOUS
  Filled 2024-08-22 (×2): qty 1

## 2024-08-22 NOTE — Progress Notes (Signed)
 Anticoagulation monitoring(Lovenox ):  53 yo  male ordered Lovenox  40 mg Q24h    Filed Weights   08/21/24 2347  Weight: (!) 154.2 kg (340 lb)   BMI 50.2   Lab Results  Component Value Date   CREATININE 11.67 (H) 08/21/2024   CREATININE 9.44 (H) 08/15/2024   CREATININE 11.60 (H) 08/08/2024   Estimated Creatinine Clearance: 10.5 mL/min (A) (by C-G formula based on SCr of 11.67 mg/dL (H)). Hemoglobin & Hematocrit     Component Value Date/Time   HGB 8.3 (L) 08/21/2024 2349   HGB 10.5 (L) 06/10/2021 0952   HCT 26.4 (L) 08/21/2024 2349   HCT 31.5 (L) 06/10/2021 9047     Per Protocol for Patient with estCrcl < 15 ml/min and BMI > 30, will transition to  Heparin  5000 units SQ Q8H.

## 2024-08-22 NOTE — Progress Notes (Addendum)
 [facility]  ROUNDING NOTE   Subjective:   Kelly Key is a 53 year old male with past medical conditions including hypertension, diabetes, COPD, PAD, and end-stage renal disease on hemodialysis.  Patient presents to the emergency department complaining of shortness of breath and has been admitted for Acute pulmonary edema (HCC) [J81.0] ESRD on dialysis (HCC) [N18.6, Z99.2] Acute on chronic congestive heart failure, unspecified heart failure type (HCC) [I50.9] Acute on chronic heart failure with preserved ejection fraction (HFpEF) (HCC) [I50.33]  Patient is known to our practice and receives outpatient dialysis treatments at [facility] on a TTS schedule, supervised by Dr. Marcelino.  Last treatment received on Saturday. States he's been monitoring his fluid intake, not sure how he became short of breath. No missed recent treatment.   Labs on ED arrival concerning for potassium 5.1, BUN 75, troponin 25, BNP 538, and Hgb 8.3.Chest xray negative.   We have been consulted to manage dialysis needs.   Objective:  Vital signs in last 24 hours:  Temp:  [97.5 F (36.4 C)-98.2 F (36.8 C)] 98.2 F (36.8 C) (10/27 0806) Pulse Rate:  [85-93] 89 (10/27 1000) Resp:  [13-22] 16 (10/27 1000) BP: (158-199)/(80-104) 168/88 (10/27 1000) SpO2:  [94 %-100 %] 96 % (10/27 1000) Weight:  [154.2 kg-159 kg] 159 kg (10/27 0806)  Weight change:  Filed Weights   08/21/24 2347 08/22/24 0806  Weight: (!) 154.2 kg (!) 159 kg    Intake/Output: I/O last 3 completed shifts: In: -  Out: 300 [Urine:300]   Intake/Output this shift:  No intake/output data recorded.  Physical Exam: General: NAD  Head: Normocephalic, atraumatic. Moist oral mucosal membranes  Eyes: Anicteric  Lungs:  Rales, mild tachypnea  Heart: Regular rate and rhythm  Abdomen:  Soft, nontender  Extremities: 3+ peripheral edema.  Neurologic: Awake, alert, conversant  Skin: Warm,dry, no rash  Access: Left upper aVF     Basic Metabolic Panel: Recent Labs  Lab 08/21/24 2349 08/22/24 0027  NA 133*  --   K 5.1  --   CL 94*  --   CO2 27  --   GLUCOSE 334*  --   BUN 75*  --   CREATININE 11.67* 12.05*  CALCIUM  8.5*  --     Liver Function Tests: No results for input(s): AST, ALT, ALKPHOS, BILITOT, PROT, ALBUMIN  in the last 168 hours. No results for input(s): LIPASE, AMYLASE in the last 168 hours. No results for input(s): AMMONIA in the last 168 hours.  CBC: Recent Labs  Lab 08/21/24 2349 08/22/24 0027  WBC 9.8 9.1  HGB 8.3* 7.8*  HCT 26.4* 24.2*  MCV 93.3 91.7  PLT 233 233    Cardiac Enzymes: No results for input(s): CKTOTAL, CKMB, CKMBINDEX, TROPONINI in the last 168 hours.  BNP: Invalid input(s): POCBNP  CBG: Recent Labs  Lab 08/15/24 1701 08/15/24 2047 08/16/24 0030 08/16/24 0738 08/16/24 1253  GLUCAP 385* 411* 289* 180* 145*    Microbiology: Results for orders placed or performed during the hospital encounter of 08/06/24  Resp panel by RT-PCR (RSV, Flu A&B, Covid) Anterior Nasal Swab     Status: None   Collection Time: 08/06/24  5:00 AM   Specimen: Anterior Nasal Swab  Result Value Ref Range Status   SARS Coronavirus 2 by RT PCR NEGATIVE NEGATIVE Final    Comment: (NOTE) SARS-CoV-2 target nucleic acids are NOT DETECTED.  The SARS-CoV-2 RNA is generally detectable in upper respiratory specimens during the acute phase of infection. The lowest concentration  of SARS-CoV-2 viral copies this assay can detect is 138 copies/mL. A negative result does not preclude SARS-Cov-2 infection and should not be used as the sole basis for treatment or other patient management decisions. A negative result may occur with  improper specimen collection/handling, submission of specimen other than nasopharyngeal swab, presence of viral mutation(s) within the areas targeted by this assay, and inadequate number of viral copies(<138 copies/mL). A negative result  must be combined with clinical observations, patient history, and epidemiological information. The expected result is Negative.  Fact Sheet for Patients:  bloggercourse.com  Fact Sheet for Healthcare Providers:  seriousbroker.it  This test is no t yet approved or cleared by the [place]  FDA and  has been authorized for detection and/or diagnosis of SARS-CoV-2 by FDA under an Emergency Use Authorization (EUA). This EUA will remain  in effect (meaning this test can be used) for the duration of the COVID-19 declaration under Section 564(b)(1) of the Act, 21 U.S.C.section 360bbb-3(b)(1), unless the authorization is terminated  or revoked sooner.       Influenza A by PCR NEGATIVE NEGATIVE Final   Influenza B by PCR NEGATIVE NEGATIVE Final    Comment: (NOTE) The Xpert Xpress SARS-CoV-2/FLU/RSV plus assay is intended as an aid in the diagnosis of influenza from Nasopharyngeal swab specimens and should not be used as a sole basis for treatment. Nasal washings and aspirates are unacceptable for Xpert Xpress SARS-CoV-2/FLU/RSV testing.  Fact Sheet for Patients: bloggercourse.com  Fact Sheet for Healthcare Providers: seriousbroker.it  This test is not yet approved or cleared by the [place]  FDA and has been authorized for detection and/or diagnosis of SARS-CoV-2 by FDA under an Emergency Use Authorization (EUA). This EUA will remain in effect (meaning this test can be used) for the duration of the COVID-19 declaration under Section 564(b)(1) of the Act, 21 U.S.C. section 360bbb-3(b)(1), unless the authorization is terminated or revoked.     Resp Syncytial Virus by PCR NEGATIVE NEGATIVE Final    Comment: (NOTE) Fact Sheet for Patients: bloggercourse.com  Fact Sheet for Healthcare Providers: seriousbroker.it  This test is  not yet approved or cleared by the [place]  FDA and has been authorized for detection and/or diagnosis of SARS-CoV-2 by FDA under an Emergency Use Authorization (EUA). This EUA will remain in effect (meaning this test can be used) for the duration of the COVID-19 declaration under Section 564(b)(1) of the Act, 21 U.S.C. section 360bbb-3(b)(1), unless the authorization is terminated or revoked.  Performed at [facility], [address], [place], [place] [address]     Coagulation Studies: No results for input(s): LABPROT, INR in the last 72 hours.  Urinalysis: No results for input(s): COLORURINE, LABSPEC, PHURINE, GLUCOSEU, HGBUR, BILIRUBINUR, KETONESUR, PROTEINUR, UROBILINOGEN, NITRITE, LEUKOCYTESUR in the last 72 hours.  Invalid input(s): APPERANCEUR    Imaging: DG Chest 2 View Result Date: 08/22/2024 EXAM: 2 VIEW(S) XRAY OF THE CHEST 08/22/2024 12:06:00 AM COMPARISON: Comparison to study dated 08/15/2024. CLINICAL HISTORY: chest pain. Patient to triage via wheelchair with complaints of worsening shortness of breath after getting up and going to the bathroom. Also endorses some chest pain. Patient states he tried his inhaler without relief and believes he may need oxygen at home. ; Patient receives dialysis T, TH, Sat and has not missed any. FINDINGS: LUNGS AND PLEURA: Increased interstitial markings, likely chronic, without frank interstitial edema. No focal pulmonary opacity. No pleural effusion. No pneumothorax. HEART AND MEDIASTINUM: No acute abnormality of the cardiac and mediastinal silhouettes. BONES AND  SOFT TISSUES: No acute osseous abnormality. IMPRESSION: 1. No acute cardiopulmonary abnormality. Electronically signed by: Sriyesh Krishnan MD 08/22/2024 12:16 AM EDT RP Workstation: [id number]     Medications:      amLODipine   10 mg Oral Daily   aspirin  EC  81 mg Oral Daily   atorvastatin   80 mg Oral Daily   carvedilol   25 mg Oral BID WC    cloNIDine   0.3 mg Oral TID   furosemide   40 mg Intravenous BID   heparin  injection (subcutaneous)  5,000 Units Subcutaneous Q8H   insulin  aspart  0-5 Units Subcutaneous QHS   insulin  aspart  0-6 Units Subcutaneous TID WC   insulin  glargine-yfgn  30 Units Subcutaneous QHS   isosorbide  mononitrate  15 mg Oral Q supper   spironolactone  25 mg Oral Daily   acetaminophen  **OR** acetaminophen , albuterol , labetalol , ondansetron  **OR** ondansetron  (ZOFRAN ) IV, mouth rinse  Assessment/ Plan:  Kelly Key is a 52 y.o.  male with past medical conditions including hypertension, diabetes, COPD, PAD, and end-stage renal disease on hemodialysis.  Patient presents to the emergency department complaining of shortness of breath and has been admitted for Acute pulmonary edema (HCC) [J81.0] ESRD on dialysis (HCC) [N18.6, Z99.2] Acute on chronic congestive heart failure, unspecified heart failure type (HCC) [I50.9] Acute on chronic heart failure with preserved ejection fraction (HFpEF) (HCC) [I50.33]  [facility]/TTS/left upper AVF  Volume overload in the setting of end-stage renal disease on hemodialysis.  Last treatment received on Saturday. Receiving dialysis today, UF goal increased 3L as tolerated. Next treatment scheduled for Tuesday.   2.  Acute respiratory failure, due to increased fluid intake.  Reports 5L removed at dialysis on Saturday. Chest x-ray negative for acute findings.   3. Anemia of chronic kidney disease Lab Results  Component Value Date   HGB 7.8 (L) 08/22/2024    Patient does receive Mircera at outpatient clinic.  Will order Retacrit  with dialysis tomorrow.   4. Secondary Hyperparathyroidism: with outpatient labs: None available Lab Results  Component Value Date   CALCIUM  8.5 (L) 08/21/2024   CAION 1.10 (L) 11/11/2019   PHOS 6.9 (H) 08/08/2024    Will continue to monitor bone minerals during ts admission. Continue Velphoro  with meals.    LOS: 0 Shantelle  Breeze 10/27/202510:36 AM  Patient was seen and examined with Faith Harris, NP.  Plan of care was formulated for the problems addressed and discussed with NP.  I agree with the note as documented except as noted below.

## 2024-08-22 NOTE — ED Notes (Signed)
 Pt being transported to rm 251 at this time. 2A called and made aware that pt is on the way.

## 2024-08-22 NOTE — TOC Initial Note (Signed)
 Transition of Care [facility]) - Initial/Assessment Note    Patient Details  Name: Kelly Key MRN: [id number] Date of Birth: 05-07-1971  Transition of Care [facility]) CM/SW Contact:    Kelly Kelly Carpen, LCSW Phone Number: 08/22/2024, 4:02 PM  Clinical Narrative:   Readmission prevention screen complete. CSW met with patient. No family at bedside. CSW introduced role and explained that discharge planning would be discussed. PCP is Lynwood Cramp, MD. Kelly Key' drives him to appointments. Pharmacy is [facility] [place]. No issues affording medications. Patient lives home alone. No home health or DME use prior to admission. No further concerns. CSW will continue to follow patient for support and facilitate return home once stable. Kelly Key' will transport him home at discharge.               Expected Discharge Plan: Home/Self Care Barriers to Discharge: Continued Medical Work up   Patient Goals and CMS Choice            Expected Discharge Plan and Services     Post Acute Care Choice: NA Living arrangements for the past 2 months: Single Family Home                                      Prior Living Arrangements/Services Living arrangements for the past 2 months: Single Family Home Lives with:: Self Patient language and need for interpreter reviewed:: Yes        Need for Family Participation in Patient Care: Yes (Comment) Care giver support system in place?: Yes (comment)   Criminal Activity/Legal Involvement Pertinent to Current Situation/Hospitalization: No - Comment as needed  Activities of Daily Living   ADL Screening (condition at time of admission) Independently performs ADLs?: Yes (appropriate for developmental age) Is the patient deaf or have difficulty hearing?: No Does the patient have difficulty seeing, even when wearing glasses/contacts?: No Does the patient have difficulty concentrating, remembering, or making decisions?: No  Permission Sought/Granted                   Emotional Assessment Appearance:: Appears stated age Attitude/Demeanor/Rapport: Engaged, Gracious Affect (typically observed): Accepting, Appropriate, Calm, Pleasant Orientation: : Oriented to Self, Oriented to Place, Oriented to  Time, Oriented to Situation Alcohol / Substance Use: Not Applicable Psych Involvement: No (comment)  Admission diagnosis:  Acute pulmonary edema (HCC) [J81.0] ESRD on dialysis (HCC) [N18.6, Z99.2] Acute on chronic congestive heart failure, unspecified heart failure type (HCC) [I50.9] Acute on chronic heart failure with preserved ejection fraction (HFpEF) (HCC) [I50.33] Acute on chronic heart failure (HCC) [I50.9] Patient Active Problem List   Diagnosis Date Noted   Acute on chronic heart failure with preserved ejection fraction (HFpEF) (HCC) 08/22/2024   Acute on chronic heart failure (HCC) 08/22/2024   Class 3 obesity (HCC) 08/16/2024   Metabolic acidosis 08/16/2024   Anemia due to end stage renal disease (HCC) 08/16/2024   Acute pulmonary edema (HCC) 08/15/2024   CHF (congestive heart failure) (HCC) 08/15/2024   Acute hypoxemic respiratory failure (HCC) 08/06/2024   Acute respiratory failure (HCC) 08/06/2024   CAP (community acquired pneumonia) 08/06/2024   Acute exacerbation of chronic obstructive pulmonary disease (COPD) (HCC) 08/06/2024   Morbid obesity with BMI of 50.0-59.9, adult (HCC) 08/06/2024   Acute respiratory failure with hypoxia (HCC) 08/04/2024   ESRD (end stage renal disease) on dialysis (HCC) 05/06/2023   PAD (peripheral artery disease) 12/01/2022   GERD (gastroesophageal  reflux disease) 11/27/2022   Dysphagia 10/18/2022   OSA (obstructive sleep apnea) 10/04/2021   Obesity hypoventilation syndrome (HCC) 10/04/2021   Organic impotence 08/16/2021   Hypertension secondary to other renal disorders 08/31/2020   Orthostasis    Pain    Physical debility 07/16/2019   Anemia of chronic disease    Uncontrolled type 2 diabetes mellitus  with hyperglycemia (HCC)    Hypervolemia associated with renal insufficiency 07/05/2019   Anasarca 07/03/2019   Dyslipidemia 07/03/2019   Chronic combined systolic and diastolic CHF (congestive heart failure) (HCC) 07/03/2019   Bilateral leg edema    Uncontrolled type 2 diabetes mellitus with hyperglycemia, with long-term current use of insulin  (HCC) 06/22/2019   Acute on chronic diastolic CHF (congestive heart failure) (HCC) 03/26/2018   Hyponatremia 06/12/2012   Microcytic anemia 06/12/2012   Essential hypertension, benign 06/11/2012   Hepatic steatosis 06/11/2012   Super obesity 06/11/2012   PCP:  Luke Agent, MD (Inactive) Pharmacy:   [facility] [address] [place] - [address] [place] [address] Phone: [phone] Fax: [phone]     Social Drivers of Health (SDOH) Social History: SDOH Screenings   Food Insecurity: No Food Insecurity (08/22/2024)  Housing: Low Risk  (08/22/2024)  Transportation Needs: No Transportation Needs (08/22/2024)  Utilities: Not At Risk (08/22/2024)  Depression (PHQ2-9): Low Risk  (12/25/2021)  Financial Resource Strain: Low Risk  (02/20/2019)  Physical Activity: Unknown (02/20/2019)  Social Connections: Moderately Integrated (08/15/2024)  Stress: No Stress Concern Present (02/20/2019)  Tobacco Use: Low Risk  (08/22/2024)  Recent Concern: Tobacco Use - Medium Risk (08/19/2024)   Received from [facility]   SDOH Interventions:     Readmission Risk Interventions    08/22/2024    4:01 PM 08/16/2024   10:15 AM 08/04/2024   10:28 AM  Readmission Risk Prevention Plan  Transportation Screening Complete Complete Complete  HRI or Home Care Consult   Complete  Social Work Consult for Recovery Care Planning/Counseling   Complete  Palliative Care Screening   Not Applicable  Medication Review [facility]) Complete Complete Complete  PCP or Specialist appointment within 3-5 days of discharge Complete    SW Recovery  Care/Counseling Consult Complete    Palliative Care Screening Not Applicable Not Applicable   Skilled Nursing Facility Not Applicable Not Applicable

## 2024-08-22 NOTE — Care Management Obs Status (Signed)
 MEDICARE OBSERVATION STATUS NOTIFICATION   Patient Details  Name: Kelly Key MRN: [id number] Date of Birth: Mar 15, 1971   Medicare Observation Status Notification Given:  Yes    Rojelio SHAUNNA Rattler 08/22/2024, 1:37 PM

## 2024-08-22 NOTE — Progress Notes (Signed)
 Pt receives out-pt HD at [facility] on TTS 1st shift. Will assist as needed.   Suzen Satchel Dialysis Navigator [phone]

## 2024-08-22 NOTE — ED Notes (Signed)
 IV medication not available at this time.  Pharmacy made aware of room placement.  Requested to have medication sent to floor

## 2024-08-22 NOTE — Progress Notes (Signed)
 Heart Failure Navigator Progress Note  Assessed for Heart & Vascular TOC clinic readiness.  Unfortunately, patient does not meet criteria due to ESRD on Hemodialysis.   Navigator will sign off at this time.  Charmaine Pines, RN, BSN [facility] Heart Failure Navigator Secure Chat Only

## 2024-08-22 NOTE — H&P (Signed)
 History and Physical    Patient: Kelly Key:[id number] DOB: 06-04-1971 DOA: 08/22/2024 DOS: the patient was seen and examined on 08/22/2024 PCP: Luke Agent, MD (Inactive)  Patient coming from: Home  Chief Complaint:  Chief Complaint  Patient presents with   Shortness of Breath   Chest Pain   HPI: Kelly Key is a 53 y.o. male with medical history significant of ESRD on hemodialysis Tuesday Thursday Saturday, still makes urine, COPD, CHF, CAD, hyperlipidemia and diabetes being admitted with acute on chronic CHF secondary to fluid overload related to a missed extra session of dialysis  Denies respiratory infectious symptoms like cough, fever, congestion. No chest pain.  In the ED, SBP 190s, mildly tachypneic, otherwise normal vitals Labs with troponin 25 and BNP 538 Hemoglobin at baseline at 8.3 Blood glucose 334 with minor electrolyte abnormalities Chest x-ray was nonacute EKG showed sinus at 88  Patient treated with IV Lasix  120 mg and Nitropaste applied Admission request   Past Medical History:  Diagnosis Date   Acute diastolic CHF (congestive heart failure) (HCC) 06/11/2012   EF 50-55% [facility])   Acute gout of right knee 08/03/2019   Chronic kidney disease    COPD (chronic obstructive pulmonary disease) (HCC)    Diabetes mellitus    A1c 11.5 (06/11/2012).   Dyspnea    Gout    Hepatic steatosis 06/11/2012   Elevated LFTs   Hyperlipemia    Malignant hypertension    Microcytic anemia 06/12/2012   NSTEMI (non-ST elevated myocardial infarction) (HCC) 05/17/2021   Obesity    Thromboembolism (HCC) 02/20/2019   Past Surgical History:  Procedure Laterality Date   ABDOMINAL AORTOGRAM W/LOWER EXTREMITY N/A 12/01/2022   Procedure: ABDOMINAL AORTOGRAM W/LOWER EXTREMITY;  Surgeon: Gretta Lonni PARAS, MD;  Location: [facility] INVASIVE CV LAB;  Service: Cardiovascular;  Laterality: N/A;   AMPUTATION Left 12/03/2022   Procedure: LEFT SECOND TOE AMPUTATION;  Surgeon: Harden Jerona GAILS, MD;  Location: [facility] OR;  Service: Orthopedics;  Laterality: Left;   AV FISTULA PLACEMENT Left 11/11/2019   Procedure: ARTERIOVENOUS (AV) FISTULA CREATION LEFT BRACHIOCEPHALIC ARM;  Surgeon: Serene Gaile ORN, MD;  Location: [facility] OR;  Service: Vascular;  Laterality: Left;   COLONOSCOPY WITH PROPOFOL  N/A 09/02/2021   Procedure: COLONOSCOPY WITH PROPOFOL ;  Surgeon: Therisa Bi, MD;  Location: [facility] ENDOSCOPY;  Service: Gastroenterology;  Laterality: N/A;   IR FLUORO GUIDE CV LINE RIGHT  07/08/2019   IR REMOVAL TUN CV CATH W/O FL  03/23/2020   IR US  GUIDE VASC ACCESS RIGHT  07/08/2019   LEFT HEART CATH AND CORONARY ANGIOGRAPHY N/A 06/12/2021   Procedure: LEFT HEART CATH AND CORONARY ANGIOGRAPHY;  Surgeon: Claudene Victory ORN, MD;  Location: [facility] INVASIVE CV LAB;  Service: Cardiovascular;  Laterality: N/A;   NO PAST SURGERIES     PERIPHERAL VASCULAR BALLOON ANGIOPLASTY Left 12/01/2022   Procedure: PERIPHERAL VASCULAR BALLOON ANGIOPLASTY;  Surgeon: Gretta Lonni PARAS, MD;  Location: [facility] INVASIVE CV LAB;  Service: Cardiovascular;  Laterality: Left;  Anterior tibial   PERIPHERAL VASCULAR INTERVENTION Left 12/01/2022   Procedure: PERIPHERAL VASCULAR INTERVENTION;  Surgeon: Gretta Lonni PARAS, MD;  Location: [facility] INVASIVE CV LAB;  Service: Cardiovascular;  Laterality: Left;  sfa   VASCULAR SURGERY     Social History:  reports that he has never smoked. He has been exposed to tobacco smoke. He has never used smokeless tobacco. He reports that he does not currently use alcohol. He reports that he does not use drugs.  Allergies  Allergen  Reactions   Hydrochlorothiazide Swelling   Ozempic  (0.25 Or 0.5 Mg-Dose) [Semaglutide (0.25 Or 0.5mg -Dos)] Shortness Of Breath   Hydralazine  Anxiety and Other (See Comments)    Hallucinations    Vitamin K And Related    Lisinopril Swelling    Family History  Problem Relation Age of Onset   Gout Mother    Asthma Mother    Diabetes Father    Heart failure Father    Diabetes  Sister    Hypertension Brother    Pancreatic cancer Brother    Diabetes Sister     Prior to Admission medications   Medication Sig Start Date End Date Taking? Authorizing Provider  acetaminophen  (TYLENOL ) 500 MG tablet Take 1,000 mg by mouth every 6 (six) hours as needed for moderate pain or headache.    [provider]  albuterol  (VENTOLIN  HFA) 108 (90 Base) MCG/ACT inhaler Inhale 2 puffs into the lungs every 4 (four) hours as needed for wheezing or shortness of breath. 08/01/24   Yolande Lamar BROCKS, MD  allopurinol  (ZYLOPRIM ) 100 MG tablet Take 2 tablets (200 mg total) by mouth daily. 08/04/19   Love, Sharlet RAMAN, PA-C  amLODipine  (NORVASC ) 10 MG tablet Take 1 tablet (10 mg total) by mouth daily. 04/04/24   Miriam Norris, NP  aspirin  EC 81 MG tablet Take 1 tablet (81 mg total) by mouth daily. 05/06/23   Pearlean Manus, MD  atorvastatin  (LIPITOR ) 80 MG tablet Take 1 tablet (80 mg total) by mouth daily. 06/13/24   Miriam Norris, NP  carvedilol  (COREG ) 25 MG tablet Take 1 tablet (25 mg total) by mouth 2 (two) times daily with a meal. 05/06/23   Emokpae, Courage, MD  cloNIDine  (CATAPRES ) 0.3 MG tablet Take 1 tablet (0.3 mg total) by mouth 3 (three) times daily. 05/06/23   Pearlean Manus, MD  clopidogrel  (PLAVIX ) 75 MG tablet Take 75 mg by mouth daily. 08/26/23   [provider]  ezetimibe  (ZETIA ) 10 MG tablet Take 10 mg by mouth every evening. 01/20/19   [provider]  fluticasone  (FLONASE ) 50 MCG/ACT nasal spray Place 1 spray into both nostrils at bedtime as needed for allergies or rhinitis.    [provider]  furosemide  (LASIX ) 80 MG tablet Take 1 tablet (80 mg total) by mouth daily. 08/16/24   Laurita Pillion, MD  gabapentin  (NEURONTIN ) 100 MG capsule Take 1 capsule (100 mg total) by mouth 3 (three) times daily. 08/03/19   Love, Sharlet RAMAN, PA-C  glucose blood (ACCU-CHEK AVIVA PLUS) test strip Use as instructed TO CHECK BLOOD GLUCOSE THREE TIMES DAILY 08/15/20    Nida, Gebreselassie W, MD  HUMALOG KWIKPEN 100 UNIT/ML KwikPen Inject 8-30 Units into the skin 3 (three) times daily. Sliding scale 30 units max TID 08/19/22   [provider]  Insulin  Pen Needle (B-D ULTRAFINE III SHORT PEN) 31G X 8 MM MISC 1 each by Does not apply route as directed. 08/13/20   Nida, Gebreselassie W, MD  Ipratropium-Albuterol  (COMBIVENT ) 20-100 MCG/ACT AERS respimat Inhale 1 puff into the lungs every 6 (six) hours as needed for wheezing or shortness of breath. 09/30/23 08/15/24  Von Bellis, MD  isosorbide  mononitrate (IMDUR ) 30 MG 24 hr tablet Take 0.5 tablets (15 mg total) by mouth daily with supper. 08/05/24 11/03/24  Laurence Locus, DO  linaclotide  (LINZESS ) 145 MCG CAPS capsule Take 145 mcg by mouth daily as needed (constipation).    [provider]  multivitamin (RENA-VIT) TABS tablet Take 1 tablet by mouth at bedtime. 06/16/24  [provider]  pantoprazole  (PROTONIX ) 40 MG tablet Take 1 tablet (40 mg total) by mouth daily. 08/09/24   Ricky Fines, MD  spironolactone (ALDACTONE) 25 MG tablet Take 1 tablet (25 mg total) by mouth daily. 08/09/24   Ricky Fines, MD  TRESIBA FLEXTOUCH 100 UNIT/ML FlexTouch Pen Inject 30 Units into the skin at bedtime. 08/08/24   Ricky Fines, MD  VELPHORO  500 MG chewable tablet Chew 500 mg by mouth 3 (three) times daily. 08/13/23   [provider]    Physical Exam: Vitals:   08/22/24 0405 08/22/24 0410 08/22/24 0415 08/22/24 0453  BP: (!) 194/103  (!) 199/98 (!) 180/92  Pulse: 89 87 90 90  Resp: 18 16 18 20   Temp:   98.2 F (36.8 C) (!) 97.5 F (36.4 C)  TempSrc:   Oral Oral  SpO2: 98% 97% 98% 95%  Weight:      Height:       Physical Exam Vitals and nursing note reviewed.  Constitutional:      General: He is not in acute distress.    Appearance: He is obese.     Comments: Sitting with head of bed elevated, mildly tachypneic, speaking in short sentences.  Appears uncomfortable  HENT:     Head:  Normocephalic and atraumatic.  Cardiovascular:     Rate and Rhythm: Normal rate and regular rhythm.     Heart sounds: Normal heart sounds.  Pulmonary:     Effort: Pulmonary effort is normal.     Breath sounds: Normal breath sounds.  Abdominal:     Palpations: Abdomen is soft.     Tenderness: There is no abdominal tenderness.  Neurological:     Mental Status: Mental status is at baseline.     Data Reviewed: Relevant notes from primary care and specialist visits, past discharge summaries as available in EHR, including Care Everywhere. Prior diagnostic testing as pertinent to current admission diagnoses Updated medications and problem lists for reconciliation ED course, including vitals, labs, imaging, treatment and response to treatment Triage notes, nursing and pharmacy notes and ED provider's notes Notable results as noted in HPI   Assessment and Plan: No notes have been filed under this hospital service. Service: Hospitalist  Acute on chronic HFpEF decompensation Fluid overload ESRD on HD - Continue Lasix  and Nitropaste -nephrology consulted for early dialysis session   Hypertensive urgency Hydralazine  allergy - continue current BP medication regimen including amlodipine , clonidine , Coreg , Aldactone -As needed labetalol    Class III obesity, BMI 50.21 - Patient reported that he did not tolerate GLP-1 agonist therapy due to severe shortness of breath after the first injection.  Therapy was abandoned. - Encourage calorie restriction   Uncontrolled type 2 diabetes, on insulin  - Continue basal insulin  with sliding scale coverage   chronic anemia secondary to CKD - H&H stable, no symptoms signs of bleeding - EPO therapy outpatient   Advance Care Planning:   Code Status: Prior   Consults: renal  Family Communication: none  Severity of Illness: The appropriate patient status for this patient is OBSERVATION. Observation status is judged to be reasonable and necessary  in order to provide the required intensity of service to ensure the patient's safety. The patient's presenting symptoms, physical exam findings, and initial radiographic and laboratory data in the context of their medical condition is felt to place them at decreased risk for further clinical deterioration. Furthermore, it is anticipated that the patient will be medically stable for discharge from the hospital within 2 midnights  of admission.   Author: Delayne LULLA Solian, MD 08/22/2024 5:09 AM  For on call review [email or web address].

## 2024-08-22 NOTE — ED Provider Notes (Addendum)
 [facility] Provider Note    Event Date/Time   First MD Initiated Contact with Patient 08/22/24 [phone]     (approximate)   History   Shortness of Breath and Chest Pain   HPI  Kelly Key is a 53 y.o. male   Past medical history of end-stage renal disease on hemodialysis Tuesday Thursday Saturday, still makes urine, COPD, CHF, CAD, hyperlipidemia and diabetes who presents to Emergency Department with leg swelling, shortness of breath, orthopnea for last several days.  Was supposed to go for an extra dialysis session to take off extra fluid on Friday but was unable to attend that session, and has a make-up session scheduled for Monday around noon time.  However the breathing has gotten so bad he came to the emergency department.  Denies respiratory infectious symptoms like cough, fever, congestion.  No chest pain.    Independent Historian contributed to assessment above: His wife corroborates information above  External Medical Documents Reviewed: Discharge summary of recent hospitalization for acute pulmonary edema      Physical Exam   Triage Vital Signs: ED Triage Vitals  Encounter Vitals Group     BP 08/21/24 2345 (!) 177/92     Girls Systolic BP Percentile --      Girls Diastolic BP Percentile --      Boys Systolic BP Percentile --      Boys Diastolic BP Percentile --      Pulse Rate 08/21/24 2345 85     Resp 08/21/24 2345 (!) 22     Temp 08/21/24 2345 97.9 F (36.6 C)     Temp Source 08/21/24 2345 Oral     SpO2 08/21/24 2345 96 %     Weight 08/21/24 2347 (!) 340 lb (154.2 kg)     Height 08/21/24 2347 5' 9 (1.753 m)     Head Circumference --      Peak Flow --      Pain Score 08/21/24 2347 3     Pain Loc --      Pain Education --      Exclude from Growth Chart --     Most recent vital signs: Vitals:   08/22/24 0315 08/22/24 0330  BP:    Pulse: 89 92  Resp: 14 16  Temp:    SpO2: 95% 99%    General: Awake, no distress.   CV:  Good peripheral perfusion.  Resp:  Normal effort.  Abd:  No distention.  Other:  Rales at bilateral lung bases.  Significant peripheral edema with pitting edema bilateral lower extremities.  Hypertensive.  Oxygenation 100% on room air.  Speaking in short phrases.  No respiratory distress.   ED Results / Procedures / Treatments   Labs (all labs ordered are listed, but only abnormal results are displayed) Labs Reviewed  BASIC METABOLIC PANEL WITH GFR - Abnormal; Notable for the following components:      Result Value   Sodium 133 (*)    Chloride 94 (*)    Glucose, Bld 334 (*)    BUN 75 (*)    Creatinine, Ser 11.67 (*)    Calcium  8.5 (*)    GFR, Estimated 5 (*)    All other components within normal limits  CBC - Abnormal; Notable for the following components:   RBC 2.83 (*)    Hemoglobin 8.3 (*)    HCT 26.4 (*)    All other components within normal limits  BRAIN NATRIURETIC PEPTIDE - Abnormal; Notable  for the following components:   B Natriuretic Peptide 538.1 (*)    All other components within normal limits  TROPONIN I (HIGH SENSITIVITY) - Abnormal; Notable for the following components:   Troponin I (High Sensitivity) 25 (*)    All other components within normal limits  TROPONIN I (HIGH SENSITIVITY)     I ordered and reviewed the above labs they are notable for BNP 500.  Poor kidney function, consistent with his known end-stage renal disease.  Cell counts with anemia, no significant change from baseline.  EKG  ED ECG REPORT I, Ginnie Shams, the attending physician, personally viewed and interpreted this ECG.   Date: 08/22/2024  EKG Time: 2350  Rate: 88  Rhythm: sinus  Axis: RAD  Intervals: First-degree AV block  ST&T Change: No STEMI    RADIOLOGY I independently reviewed and interpreted chest x-ray and see diffuse opacities concerning for pulmonary edema I also reviewed radiologist's formal read.   PROCEDURES:  Critical Care performed:  No  Procedures   MEDICATIONS ORDERED IN ED: Medications  furosemide  (LASIX ) 120 mg in dextrose  5 % 50 mL IVPB (has no administration in time range)  furosemide  (LASIX ) injection 80 mg (80 mg Intravenous Given 08/22/24 0059)  nitroGLYCERIN  (NITROGLYN) 2 % ointment 1 inch (1 inch Topical Given 08/22/24 0100)    External physician / consultants:  I spoke with hospital medicine for admission and regarding care plan for this patient.   IMPRESSION / MDM / ASSESSMENT AND PLAN / ED COURSE  I reviewed the triage vital signs and the nursing notes.                                Patient's presentation is most consistent with acute presentation with potential threat to life or bodily function.  Differential diagnosis includes, but is not limited to, fluid overload, pulmonary edema, CHF exacerbation, ACS, PE, respiratory infection   The patient is on the cardiac monitor to evaluate for evidence of arrhythmia and/or significant heart rate changes.  MDM:    Looks fluid overloaded with rales on auscultation, orthopnea, pitting edema, in the setting of missing dialysis session for getting extra fluid off.  Think he needs dialysis.  Fortunately his oxygenation is normal and he is not in significant respiratory distress though does show somewhat increased work of breathing especially when trying to talk to me, speaking only in short phrases.  Will trial Lasix  first, apply Nitropaste.  Keep on cardiopulmonary monitoring.   Unfortunately 80 of IV Lasix  has produced no urine output.  I put on some Nitropaste given his hypertension and this is somewhat relieved symptoms.  He remained stable on cardiopulmonary monitoring.  Will trial another 120 IV Lasix .  Consult with nephrology to try to get him dialysis while here, epic message sent to Dr. Douglas.  Hospital admission.       FINAL CLINICAL IMPRESSION(S) / ED DIAGNOSES   Final diagnoses:  ESRD on dialysis (HCC)  Acute on chronic congestive  heart failure, unspecified heart failure type (HCC)  Acute pulmonary edema (HCC)     Rx / DC Orders   ED Discharge Orders     None        Note:  This document was prepared using Dragon voice recognition software and may include unintentional dictation errors.    Shams Ginnie, MD 08/22/24 [id number]    Shams Ginnie, MD 08/22/24 [phone]

## 2024-08-22 NOTE — Progress Notes (Signed)
  Interim progress note not for billing   I have seen and examined the patient, reviewed the chart, and agree with the assessment and plan. Will update TTE, plan is additional dialysis session tomorrow, possible discharge after. Poor compliance with fluid restriction thought to be main contributor here.

## 2024-08-22 NOTE — Progress Notes (Signed)
 Hemodialysis Note:  Received patient in bed to unit. Alert and oriented. Informed consent singed and in chart.  Treatment initiated: 0820 Treatment completed: 1120  Access used: Left AVF Access issues: None  Patient tolerated well. Transported back to room, alert without acute distress. Report given to patient's RN.  Total UF removed: 3 liters Medications given: None  Post HD weight: 153.3 Kg  [facility]

## 2024-08-22 NOTE — ED Notes (Signed)
 Dr. Cleatus at bedside for admission.  Made aware of BP.  Will place PRN medication order and continue to monitor

## 2024-08-23 DIAGNOSIS — I5033 Acute on chronic diastolic (congestive) heart failure: Secondary | ICD-10-CM | POA: Diagnosis not present

## 2024-08-23 DIAGNOSIS — I050 Rheumatic mitral stenosis: Secondary | ICD-10-CM | POA: Insufficient documentation

## 2024-08-23 LAB — CBC
HCT: 23.9 % — ABNORMAL LOW (ref 39.0–52.0)
Hemoglobin: 7.6 g/dL — ABNORMAL LOW (ref 13.0–17.0)
MCH: 29.1 pg (ref 26.0–34.0)
MCHC: 31.8 g/dL (ref 30.0–36.0)
MCV: 91.6 fL (ref 80.0–100.0)
Platelets: 228 K/uL (ref 150–400)
RBC: 2.61 MIL/uL — ABNORMAL LOW (ref 4.22–5.81)
RDW: 15.2 % (ref 11.5–15.5)
WBC: 6.6 K/uL (ref 4.0–10.5)
nRBC: 0 % (ref 0.0–0.2)

## 2024-08-23 LAB — RENAL FUNCTION PANEL
Albumin: 3.1 g/dL — ABNORMAL LOW (ref 3.5–5.0)
Anion gap: 11 (ref 5–15)
BUN: 66 mg/dL — ABNORMAL HIGH (ref 6–20)
CO2: 27 mmol/L (ref 22–32)
Calcium: 8.3 mg/dL — ABNORMAL LOW (ref 8.9–10.3)
Chloride: 96 mmol/L — ABNORMAL LOW (ref 98–111)
Creatinine, Ser: 10.78 mg/dL — ABNORMAL HIGH (ref 0.61–1.24)
GFR, Estimated: 5 mL/min — ABNORMAL LOW (ref >60)
Glucose, Bld: 81 mg/dL (ref 70–99)
Phosphorus: 5.2 mg/dL — ABNORMAL HIGH (ref 2.5–4.6)
Potassium: 4.3 mmol/L (ref 3.5–5.1)
Sodium: 134 mmol/L — ABNORMAL LOW (ref 135–145)

## 2024-08-23 LAB — GLUCOSE, CAPILLARY: Glucose-Capillary: 131 mg/dL — ABNORMAL HIGH (ref 70–99)

## 2024-08-23 MED ORDER — SUCROFERRIC OXYHYDROXIDE 500 MG PO CHEW
500.0000 mg | CHEWABLE_TABLET | Freq: Three times a day (TID) | ORAL | Status: DC
  Administered 2024-08-23: 500 mg via ORAL
  Filled 2024-08-23 (×2): qty 1

## 2024-08-23 MED ORDER — EPOETIN ALFA-EPBX 10000 UNIT/ML IJ SOLN
10000.0000 [IU] | INTRAMUSCULAR | Status: DC
  Administered 2024-08-23: 10000 [IU] via INTRAVENOUS

## 2024-08-23 MED ORDER — EPOETIN ALFA-EPBX 10000 UNIT/ML IJ SOLN
INTRAMUSCULAR | Status: AC
  Filled 2024-08-23: qty 1

## 2024-08-23 NOTE — Plan of Care (Signed)

## 2024-08-23 NOTE — Plan of Care (Signed)
   Problem: Education: Goal: Knowledge of General Education information will improve Description Including pain rating scale, medication(s)/side effects and non-pharmacologic comfort measures Outcome: Progressing

## 2024-08-23 NOTE — Discharge Summary (Signed)
 [name] FMW:[id number] DOB: 02-22-1971 DOA: 08/22/2024  PCP: Kelly Agent, MD (Inactive)  Admit date: 08/22/2024 Discharge date: 08/23/2024  Time spent: 35 minutes  Recommendations for Outpatient Follow-up:  Pcp f/u, patient is interested in cpap  Cardiology f/u    Discharge Diagnoses:  Principal Problem:   Acute on chronic heart failure with preserved ejection fraction (HFpEF) (HCC) Active Problems:   ESRD (end stage renal disease) on dialysis (HCC)   Uncontrolled type 2 diabetes mellitus with hyperglycemia (HCC)   OSA (obstructive sleep apnea)   PAD (peripheral artery disease)   Essential hypertension, benign   Chronic combined systolic and diastolic CHF (congestive heart failure) (HCC)   Obesity hypoventilation syndrome (HCC)   Class 3 obesity (HCC)   Anemia due to end stage renal disease (HCC)   Acute on chronic heart failure (HCC)   Mitral stenosis   Discharge Condition: improved  Diet recommendation: heart healthy fluid restricted  Filed Weights   08/23/24 0450 08/23/24 0736 08/23/24 1139  Weight: (!) 154.8 kg (!) 155.1 kg (!) 153.2 kg    History of present illness:  From admission h and p  Kelly Key is a 53 y.o. male with medical history significant of ESRD on hemodialysis Tuesday Thursday Saturday, still makes urine, COPD, CHF, CAD, hyperlipidemia and diabetes being admitted with acute on chronic CHF secondary to fluid overload related to a missed extra session of dialysis  Denies respiratory infectious symptoms like cough, fever, congestion. No chest pain.   Hospital Course:   Patient presents with dyspnea and lower extremity edema secondary to fluid overload. Missed extra dialysis session scheduled for Friday, also not fully compliant with fluid restriction. Here dialysis with ultrafiltration performed on two consecutive days, removing 3 liters each time. Symptomatically improved, not requiring oxygen, cleared by nephrology for discharge to resume tts  schedule. Patient thinks untreated sleep apnea contributes to his nocturnal breathing problems and he is probably right - advise close f/u with pcp regarding that. TTE updated, shows mild/mod mitral stenosis otherwise no significant changes, advise outpt f/u with cardiology  Procedures: hemodialysis   Consultations: nephrology  Discharge Exam: Vitals:   08/23/24 1247 08/23/24 1247  BP: (!) 147/85 (!) 147/85  Pulse:  82  Resp:  19  Temp:  97.6 F (36.4 C)  SpO2:  96%    General: NAD Cardiovascular: RRR Respiratory: CTAB Ext: LE edema improving  Discharge Instructions   Discharge Instructions     Diet - low sodium heart healthy   Complete by: As directed    Increase activity slowly   Complete by: As directed       Allergies as of 08/23/2024       Reactions   Hydrochlorothiazide Swelling   Ozempic  (0.25 Or 0.5 Mg-dose) [semaglutide (0.25 Or 0.5mg -dos)] Shortness Of Breath   Hydralazine  Anxiety, Other (See Comments)   Hallucinations    Vitamin K And Related    Lisinopril Swelling        Medication List     TAKE these medications    Accu-Chek Aviva Plus test strip Generic drug: glucose blood Use as instructed TO CHECK BLOOD GLUCOSE THREE TIMES DAILY   acetaminophen  500 MG tablet Commonly known as: TYLENOL  Take 1,000 mg by mouth every 6 (six) hours as needed for moderate pain or headache.   albuterol  108 (90 Base) MCG/ACT inhaler Commonly known as: VENTOLIN  HFA Inhale 2 puffs into the lungs every 4 (four) hours as needed for wheezing or shortness of breath.   allopurinol  100  MG tablet Commonly known as: ZYLOPRIM  Take 2 tablets (200 mg total) by mouth daily.   amLODipine  10 MG tablet Commonly known as: NORVASC  Take 1 tablet (10 mg total) by mouth daily.   aspirin  EC 81 MG tablet Take 1 tablet (81 mg total) by mouth daily.   atorvastatin  80 MG tablet Commonly known as: LIPITOR  Take 1 tablet (80 mg total) by mouth daily.   B-D ULTRAFINE III SHORT  PEN 31G X 8 MM Misc Generic drug: Insulin  Pen Needle 1 each by Does not apply route as directed.   carvedilol  25 MG tablet Commonly known as: COREG  Take 1 tablet (25 mg total) by mouth 2 (two) times daily with a meal.   cloNIDine  0.3 MG tablet Commonly known as: CATAPRES  Take 1 tablet (0.3 mg total) by mouth 3 (three) times daily.   clopidogrel  75 MG tablet Commonly known as: PLAVIX  Take 75 mg by mouth daily.   ezetimibe  10 MG tablet Commonly known as: ZETIA  Take 10 mg by mouth every evening.   fluticasone  50 MCG/ACT nasal spray Commonly known as: FLONASE  Place 1 spray into both nostrils at bedtime as needed for allergies or rhinitis.   furosemide  80 MG tablet Commonly known as: LASIX  Take 1 tablet (80 mg total) by mouth daily.   gabapentin  100 MG capsule Commonly known as: NEURONTIN  Take 1 capsule (100 mg total) by mouth 3 (three) times daily.   HumaLOG KwikPen 100 UNIT/ML KwikPen Generic drug: insulin  lispro Inject 8-30 Units into the skin 3 (three) times daily. Sliding scale 30 units max TID   Ipratropium-Albuterol  20-100 MCG/ACT Aers respimat Commonly known as: COMBIVENT  Inhale 1 puff into the lungs every 6 (six) hours as needed for wheezing or shortness of breath.   isosorbide  mononitrate 30 MG 24 hr tablet Commonly known as: IMDUR  Take 0.5 tablets (15 mg total) by mouth daily with supper.   Linzess  145 MCG Caps capsule Generic drug: linaclotide  Take 145 mcg by mouth daily as needed (constipation).   multivitamin Tabs tablet Take 1 tablet by mouth at bedtime.   pantoprazole  40 MG tablet Commonly known as: PROTONIX  Take 1 tablet (40 mg total) by mouth daily.   spironolactone 25 MG tablet Commonly known as: ALDACTONE Take 1 tablet (25 mg total) by mouth daily.   Tresiba FlexTouch 100 UNIT/ML FlexTouch Pen Generic drug: insulin  degludec Inject 30 Units into the skin at bedtime.   Velphoro  500 MG chewable tablet Generic drug: sucroferric  oxyhydroxide Chew 500 mg by mouth 3 (three) times daily.       Allergies  Allergen Reactions   Hydrochlorothiazide Swelling   Ozempic  (0.25 Or 0.5 Mg-Dose) [Semaglutide (0.25 Or 0.5mg -Dos)] Shortness Of Breath   Hydralazine  Anxiety and Other (See Comments)    Hallucinations    Vitamin K And Related    Lisinopril Swelling    Follow-up Information     Kelly Agent, MD Follow up.   Specialty: Internal Medicine Contact information: [address] [place] [address] [phone]                  The results of significant diagnostics from this hospitalization (including imaging, microbiology, ancillary and laboratory) are listed below for reference.    Significant Diagnostic Studies: ECHOCARDIOGRAM COMPLETE Result Date: 08/22/2024    ECHOCARDIOGRAM REPORT   Patient Name:   Erdem K Decaprio Date of Exam: 08/22/2024 Medical Rec #:  [id number]    Height:       69.0 in Accession #:  [id number]   Weight:       338.0 lb Date of Birth:  [birth year]     BSA:          2.582 m Patient Age:    53 years     BP:           147/76 mmHg Patient Gender: M            HR:           86 bpm. Exam Location:  [facility] Procedure: 2D Echo, Color Doppler and Cardiac Doppler (Both Spectral and Color            Flow Doppler were utilized during procedure). Indications:     CHF-Acute Diastolic I50.31  History:         Patient has prior history of Echocardiogram examinations, most                  recent 01/12/2023. CHF.  Sonographer:     Ashley McNeely-Sloane Referring Phys:  [id number] Roshon Duell BEDFORD TNLX Diagnosing Phys: Deatrice Cage MD IMPRESSIONS  1. Left ventricular ejection fraction, by estimation, is 55 to 60%. The left ventricle has normal function. The left ventricle has no regional wall motion abnormalities. There is moderate left ventricular hypertrophy. Left ventricular diastolic parameters are indeterminate.  2. Right ventricular systolic function is normal. The right ventricular size is normal.  Tricuspid regurgitation signal is inadequate for assessing PA pressure.  3. Left atrial size was mildly dilated.  4. Right atrial size was mildly dilated.  5. The mitral valve is normal in structure. Trivial mitral valve regurgitation. Mild to moderate mitral stenosis. The mean mitral valve gradient is 8.0 mmHg. Moderate mitral annular calcification.  6. The aortic valve is calcified. Aortic valve regurgitation is not visualized. Aortic valve sclerosis/calcification is present, without any evidence of aortic stenosis. Aortic valve mean gradient measures 5.0 mmHg.  7. Aortic dilatation noted. There is mild dilatation of the aortic root, measuring 42 mm. FINDINGS  Left Ventricle: Left ventricular ejection fraction, by estimation, is 55 to 60%. The left ventricle has normal function. The left ventricle has no regional wall motion abnormalities. The left ventricular internal cavity size was normal in size. There is  moderate left ventricular hypertrophy. Left ventricular diastolic parameters are indeterminate. Right Ventricle: The right ventricular size is normal. No increase in right ventricular wall thickness. Right ventricular systolic function is normal. Tricuspid regurgitation signal is inadequate for assessing PA pressure. Left Atrium: Left atrial size was mildly dilated. Right Atrium: Right atrial size was mildly dilated. Pericardium: There is no evidence of pericardial effusion. Mitral Valve: The mitral valve is normal in structure. Moderate mitral annular calcification. Trivial mitral valve regurgitation. Mild to moderate mitral valve stenosis. MV peak gradient, 15.8 mmHg. The mean mitral valve gradient is 8.0 mmHg. Tricuspid Valve: The tricuspid valve is normal in structure. Tricuspid valve regurgitation is not demonstrated. No evidence of tricuspid stenosis. Aortic Valve: The aortic valve is calcified. Aortic valve regurgitation is not visualized. Aortic valve sclerosis/calcification is present, without any  evidence of aortic stenosis. Aortic valve mean gradient measures 5.0 mmHg. Aortic valve peak gradient measures 8.8 mmHg. Aortic valve area, by VTI measures 2.62 cm. Pulmonic Valve: The pulmonic valve was normal in structure. Pulmonic valve regurgitation is trivial. No evidence of pulmonic stenosis. Aorta: Aortic dilatation noted. There is mild dilatation of the aortic root, measuring 42 mm. Venous: The inferior vena cava was not well visualized. IAS/Shunts: No atrial level shunt detected by color  flow Doppler.  LEFT VENTRICLE PLAX 2D LVIDd:         5.20 cm      Diastology LVIDs:         2.90 cm      LV e' medial:    6.96 cm/s LV PW:         1.50 cm      LV E/e' medial:  20.4 LV IVS:        1.30 cm      LV e' lateral:   7.94 cm/s LVOT diam:     2.10 cm      LV E/e' lateral: 17.9 LV SV:         66 LV SV Index:   25 LVOT Area:     3.46 cm  LV Volumes (MOD) LV vol d, MOD A2C: 108.0 ml LV vol d, MOD A4C: 123.0 ml LV vol s, MOD A2C: 44.4 ml LV vol s, MOD A4C: 51.2 ml LV SV MOD A2C:     63.6 ml LV SV MOD A4C:     123.0 ml LV SV MOD BP:      65.1 ml RIGHT VENTRICLE RV Basal diam:  4.90 cm RV Mid diam:    3.60 cm RV S prime:     12.10 cm/s TAPSE (M-mode): 2.1 cm LEFT ATRIUM           Index        RIGHT ATRIUM           Index LA diam:      4.90 cm 1.90 cm/m   RA Area:     24.00 cm LA Vol (A2C): 94.3 ml 36.52 ml/m  RA Volume:   75.30 ml  29.16 ml/m LA Vol (A4C): 50.5 ml 19.56 ml/m  AORTIC VALVE                    PULMONIC VALVE AV Area (Vmax):    2.36 cm     PV Vmax:        1.11 m/s AV Area (Vmean):   2.32 cm     PV Vmean:       77.100 cm/s AV Area (VTI):     2.62 cm     PV VTI:         0.200 m AV Vmax:           148.00 cm/s  PV Peak grad:   4.9 mmHg AV Vmean:          99.500 cm/s  PV Mean grad:   3.0 mmHg AV VTI:            0.251 m      RVOT Peak grad: 3 mmHg AV Peak Grad:      8.8 mmHg AV Mean Grad:      5.0 mmHg LVOT Vmax:         101.00 cm/s LVOT Vmean:        66.700 cm/s LVOT VTI:          0.190 m LVOT/AV VTI  ratio: 0.76  AORTA Ao Root diam: 4.25 cm Ao Asc diam:  3.30 cm MITRAL VALVE MV Area (PHT): 3.48 cm     SHUNTS MV Area VTI:   1.63 cm     Systemic VTI:  0.19 m MV Peak grad:  15.8 mmHg    Systemic Diam: 2.10 cm MV Mean grad:  8.0 mmHg     Pulmonic VTI:  0.173 m MV Vmax:  1.99 m/s MV Vmean:      132.0 cm/s MV Decel Time: 218 msec MV E velocity: 142.00 cm/s MV A velocity: 99.00 cm/s MV E/A ratio:  1.43 Deatrice Cage MD Electronically signed by Deatrice Cage MD Signature Date/Time: 08/22/2024/3:47:48 PM    Final    DG Chest 2 View Result Date: 08/22/2024 EXAM: 2 VIEW(S) XRAY OF THE CHEST 08/22/2024 12:06:00 AM COMPARISON: Comparison to study dated 08/15/2024. CLINICAL HISTORY: chest pain. Patient to triage via wheelchair with complaints of worsening shortness of breath after getting up and going to the bathroom. Also endorses some chest pain. Patient states he tried his inhaler without relief and believes he may need oxygen at home. ; Patient receives dialysis T, TH, Sat and has not missed any. FINDINGS: LUNGS AND PLEURA: Increased interstitial markings, likely chronic, without frank interstitial edema. No focal pulmonary opacity. No pleural effusion. No pneumothorax. HEART AND MEDIASTINUM: No acute abnormality of the cardiac and mediastinal silhouettes. BONES AND SOFT TISSUES: No acute osseous abnormality. IMPRESSION: 1. No acute cardiopulmonary abnormality. Electronically signed by: Pinkie Pebbles MD 08/22/2024 12:16 AM EDT RP Workstation: [id number]   DG Chest 2 View Result Date: 08/15/2024 CLINICAL DATA:  Initial evaluation for acute shortness of breath. EXAM: CHEST - 2 VIEW COMPARISON:  Prior radiograph from 08/06/2024 FINDINGS: Cardiomegaly, stable. Mediastinal silhouette within normal limits. Aortic atherosclerosis. Lungs normally inflated. Perihilar vascular congestion with diffuse interstitial prominence, consistent with pulmonary interstitial congestion/edema. No pleural effusion. No visible  focal infiltrates. No pneumothorax. Visualized soft tissues and osseous structures demonstrate no acute finding. Mild thoracic scoliosis noted. IMPRESSION: Cardiomegaly with pulmonary interstitial congestion/edema, consistent with CHF. Electronically Signed   By: Morene Hoard M.D.   On: 08/15/2024 04:06   DG Chest Portable 1 View Result Date: 08/06/2024 EXAM: 1 VIEW(S) XRAY OF THE CHEST 08/06/2024 05:10:07 AM COMPARISON: 08/04/2024 CLINICAL HISTORY: sob. Pt with c/o SOB, cough, and congestion since being discharged yesterday. Pt scheduled for HD this am but states was feeling too poorly. Pt's O2 sats on R/A were 85%. Placed pt on O2 @ 4L [place] and sats up to 95%. EDP @ bedside. FINDINGS: LUNGS AND PLEURA: Right basilar airspace opacities. Mild pulmonary edema. No pleural effusion. No pneumothorax. HEART AND MEDIASTINUM: No acute abnormality of the cardiac and mediastinal silhouettes. BONES AND SOFT TISSUES: No acute osseous abnormality. IMPRESSION: 1. Right basilar airspace opacities, suspicious for pneumonia or aspiration. 2. Mild pulmonary edema. Electronically signed by: Waddell Calk MD 08/06/2024 05:18 AM EDT RP Workstation: [id number]   DG Chest 2 View Result Date: 08/04/2024 CLINICAL DATA:  Shortness of breath EXAM: CHEST - 2 VIEW COMPARISON:  08/01/2024 FINDINGS: Cardiac shadow is mildly prominent but accentuated by the frontal technique. Central vascular congestion is noted increased from the prior exam with mild edema. Increasing right basilar atelectasis is noted. IMPRESSION: Worsening CHF with new right basilar atelectasis. Electronically Signed   By: Oneil Devonshire M.D.   On: 08/04/2024 03:36   DG Chest 2 View Result Date: 08/01/2024 CLINICAL DATA:  Shortness of breath for several days, initial encounter EXAM: CHEST - 2 VIEW COMPARISON:  10/27/2023 FINDINGS: Cardiac shadow is enlarged. Mild vascular congestion is noted although improved when compared with the prior exam. No peripheral edema  is seen. No focal infiltrate is noted. IMPRESSION: Mild central vascular congestion without edema. Electronically Signed   By: Oneil Devonshire M.D.   On: 08/01/2024 21:05    Microbiology: No results found for this or any previous visit (from the past 240 hours).  Labs: Basic Metabolic Panel: Recent Labs  Lab 08/21/24 2349 08/22/24 0027 08/22/24 0149 08/23/24 0500  NA 133*  --  131* 134*  K 5.1  --  5.3* 4.3  CL 94*  --  94* 96*  CO2 27  --  24 27  GLUCOSE 334*  --  260* 81  BUN 75*  --  81* 66*  CREATININE 11.67* 12.05* 12.22* 10.78*  CALCIUM  8.5*  --  8.4* 8.3*  PHOS  --   --  4.4 5.2*   Liver Function Tests: Recent Labs  Lab 08/22/24 0149 08/23/24 0500  ALBUMIN  3.2* 3.1*   No results for input(s): LIPASE, AMYLASE in the last 168 hours. No results for input(s): AMMONIA in the last 168 hours. CBC: Recent Labs  Lab 08/21/24 2349 08/22/24 0027 08/23/24 0500  WBC 9.8 9.1 6.6  HGB 8.3* 7.8* 7.6*  HCT 26.4* 24.2* 23.9*  MCV 93.3 91.7 91.6  PLT 233 233 228   Cardiac Enzymes: No results for input(s): CKTOTAL, CKMB, CKMBINDEX, TROPONINI in the last 168 hours. BNP: BNP (last 3 results) Recent Labs    01/08/24 1042 04/01/24 0938 08/22/24 0055  BNP 143.0* 92.5 538.1*    ProBNP (last 3 results) No results for input(s): PROBNP in the last 8760 hours.  CBG: Recent Labs  Lab 08/22/24 1307 08/22/24 1648 08/22/24 1932 08/23/24 1244  GLUCAP 178* 303* 310* 131*       Signed:  Devaughn KATHEE Ban MD.  [facility] 08/23/2024, 1:30 PM

## 2024-08-23 NOTE — Progress Notes (Signed)
   08/23/24 1139  Vitals  Temp 98.3 F (36.8 C)  BP 137/74  Pulse Rate 73  Resp 14  Weight (!) 153.2 kg  Type of Weight Post-Dialysis  Oxygen Therapy  SpO2 100 %  O2 Device Room Air  Patient Activity (if Appropriate) In bed  Pulse Oximetry Type Continuous  Oximetry Probe Site Changed No  Post Treatment  Dialyzer Clearance Clear  Liters Processed 76.8  Fluid Removed (mL) 2800 mL  Tolerated HD Treatment Yes  Post-Hemodialysis Comments Goal has been met. Tx has been tolerated. Vs have remained stable. Retacrit  10,000  has been administered. No verbalized concerns post tx. Hemostasis achieved.  AVG/AVF Arterial Site Held (minutes) 5 minutes  AVG/AVF Venous Site Held (minutes) 5 minutes

## 2024-08-23 NOTE — Inpatient Diabetes Management (Signed)
 Inpatient Diabetes Program Recommendations  AACE/ADA: New Consensus Statement on Inpatient Glycemic Control   Target Ranges:  Prepandial:   less than 140 mg/dL      Peak postprandial:   less than 180 mg/dL (1-2 hours)      Critically ill patients:  140 - 180 mg/dL    Latest Reference Range & Units 08/23/24 05:00  Glucose 70 - 99 mg/dL 81    Latest Reference Range & Units 08/22/24 13:07 08/22/24 16:48 08/22/24 19:32  Glucose-Capillary 70 - 99 mg/dL 821 (H) 696 (H) 689 (H)    Review of Glycemic Control  Diabetes history: DM2 Outpatient Diabetes medications: Tresiba 30 at bedtime, Humalog 8-30 TID Current orders for Inpatient glycemic control: Semglee  30 units at bedtime, Novolog  0-6 units TID with meals, Novolog  0-5 units QHS  Inpatient Diabetes Program Recommendations:    Insulin : If patient remains inpatient today, please consider ordering Novolog  3 units TID with meals for meal coverage if patient eats at least 50% of meals.  Thanks, Earnie Gainer, RN, MSN, CDCES Diabetes Coordinator Inpatient Diabetes Program [phone] (Team Pager from 8am to 5pm)

## 2024-08-23 NOTE — Progress Notes (Signed)
 [facility]  ROUNDING NOTE   Subjective:   Kelly Key is a 53 year old male with past medical conditions including hypertension, diabetes, COPD, PAD, and end-stage renal disease on hemodialysis.  Patient presents to the emergency department complaining of shortness of breath and has been admitted for Acute pulmonary edema (HCC) [J81.0] ESRD on dialysis (HCC) [N18.6, Z99.2] Acute on chronic congestive heart failure, unspecified heart failure type (HCC) [I50.9] Acute on chronic heart failure with preserved ejection fraction (HFpEF) (HCC) [I50.33] Acute on chronic heart failure (HCC) [I50.9]  Patient is known to our practice and receives outpatient dialysis treatments at [facility] on a TTS schedule, supervised by Dr. Marcelino.    Update: Patient seen and evaluated during dialysis   HEMODIALYSIS FLOWSHEET:  Blood Flow Rate (mL/min): 399 mL/min Arterial Pressure (mmHg): -141 mmHg Venous Pressure (mmHg): 238.78 mmHg TMP (mmHg): 13.33 mmHg Ultrafiltration Rate (mL/min): 1114 mL/min Dialysate Flow Rate (mL/min): 299 ml/min  States respiratory status has improved  Objective:  Vital signs in last 24 hours:  Temp:  [97.7 F (36.5 C)-98.8 F (37.1 C)] 98.8 F (37.1 C) (10/28 0736) Pulse Rate:  [70-91] 78 (10/28 0800) Resp:  [15-20] 15 (10/28 0800) BP: (128-168)/(68-104) 158/68 (10/28 0800) SpO2:  [90 %-98 %] 96 % (10/28 0800) Weight:  [153.3 kg-155.1 kg] 155.1 kg (10/28 0736)  Weight change: 4.777 kg Filed Weights   08/22/24 1123 08/23/24 0450 08/23/24 0736  Weight: (!) 153.3 kg (!) 154.8 kg (!) 155.1 kg    Intake/Output: I/O last 3 completed shifts: In: 1200 [P.O.:1200] Out: 3750 [Urine:750; Other:3000]   Intake/Output this shift:  No intake/output data recorded.  Physical Exam: General: NAD  Head: Normocephalic, atraumatic. Moist oral mucosal membranes  Eyes: Anicteric  Lungs:  Basilar rales, normal effort  Heart: Regular rate and rhythm  Abdomen:   Soft, nontender  Extremities: 2+ peripheral edema.  Neurologic: Awake, alert, conversant  Skin: Warm,dry, no rash  Access: Left upper aVF    Basic Metabolic Panel: Recent Labs  Lab 08/21/24 2349 08/22/24 0027 08/22/24 0149 08/23/24 0500  NA 133*  --  131* 134*  K 5.1  --  5.3* 4.3  CL 94*  --  94* 96*  CO2 27  --  24 27  GLUCOSE 334*  --  260* 81  BUN 75*  --  81* 66*  CREATININE 11.67* 12.05* 12.22* 10.78*  CALCIUM  8.5*  --  8.4* 8.3*  PHOS  --   --  4.4 5.2*    Liver Function Tests: Recent Labs  Lab 08/22/24 0149 08/23/24 0500  ALBUMIN  3.2* 3.1*   No results for input(s): LIPASE, AMYLASE in the last 168 hours. No results for input(s): AMMONIA in the last 168 hours.  CBC: Recent Labs  Lab 08/21/24 2349 08/22/24 0027 08/23/24 0500  WBC 9.8 9.1 6.6  HGB 8.3* 7.8* 7.6*  HCT 26.4* 24.2* 23.9*  MCV 93.3 91.7 91.6  PLT 233 233 228    Cardiac Enzymes: No results for input(s): CKTOTAL, CKMB, CKMBINDEX, TROPONINI in the last 168 hours.  BNP: Invalid input(s): POCBNP  CBG: Recent Labs  Lab 08/16/24 1253 08/22/24 1307 08/22/24 1648 08/22/24 1932  GLUCAP 145* 178* 303* 310*    Microbiology: Results for orders placed or performed during the hospital encounter of 08/06/24  Resp panel by RT-PCR (RSV, Flu A&B, Covid) Anterior Nasal Swab     Status: None   Collection Time: 08/06/24  5:00 AM   Specimen: Anterior Nasal Swab  Result Value Ref Range Status  SARS Coronavirus 2 by RT PCR NEGATIVE NEGATIVE Final    Comment: (NOTE) SARS-CoV-2 target nucleic acids are NOT DETECTED.  The SARS-CoV-2 RNA is generally detectable in upper respiratory specimens during the acute phase of infection. The lowest concentration of SARS-CoV-2 viral copies this assay can detect is 138 copies/mL. A negative result does not preclude SARS-Cov-2 infection and should not be used as the sole basis for treatment or other patient management decisions. A negative  result may occur with  improper specimen collection/handling, submission of specimen other than nasopharyngeal swab, presence of viral mutation(s) within the areas targeted by this assay, and inadequate number of viral copies(<138 copies/mL). A negative result must be combined with clinical observations, patient history, and epidemiological information. The expected result is Negative.  Fact Sheet for Patients:  bloggercourse.com  Fact Sheet for Healthcare Providers:  seriousbroker.it  This test is no t yet approved or cleared by the [place]  FDA and  has been authorized for detection and/or diagnosis of SARS-CoV-2 by FDA under an Emergency Use Authorization (EUA). This EUA will remain  in effect (meaning this test can be used) for the duration of the COVID-19 declaration under Section 564(b)(1) of the Act, 21 U.S.C.section 360bbb-3(b)(1), unless the authorization is terminated  or revoked sooner.       Influenza A by PCR NEGATIVE NEGATIVE Final   Influenza B by PCR NEGATIVE NEGATIVE Final    Comment: (NOTE) The Xpert Xpress SARS-CoV-2/FLU/RSV plus assay is intended as an aid in the diagnosis of influenza from Nasopharyngeal swab specimens and should not be used as a sole basis for treatment. Nasal washings and aspirates are unacceptable for Xpert Xpress SARS-CoV-2/FLU/RSV testing.  Fact Sheet for Patients: bloggercourse.com  Fact Sheet for Healthcare Providers: seriousbroker.it  This test is not yet approved or cleared by the [place]  FDA and has been authorized for detection and/or diagnosis of SARS-CoV-2 by FDA under an Emergency Use Authorization (EUA). This EUA will remain in effect (meaning this test can be used) for the duration of the COVID-19 declaration under Section 564(b)(1) of the Act, 21 U.S.C. section 360bbb-3(b)(1), unless the authorization is  terminated or revoked.     Resp Syncytial Virus by PCR NEGATIVE NEGATIVE Final    Comment: (NOTE) Fact Sheet for Patients: bloggercourse.com  Fact Sheet for Healthcare Providers: seriousbroker.it  This test is not yet approved or cleared by the [place]  FDA and has been authorized for detection and/or diagnosis of SARS-CoV-2 by FDA under an Emergency Use Authorization (EUA). This EUA will remain in effect (meaning this test can be used) for the duration of the COVID-19 declaration under Section 564(b)(1) of the Act, 21 U.S.C. section 360bbb-3(b)(1), unless the authorization is terminated or revoked.  Performed at [facility], [address], [place], [place] [address]     Coagulation Studies: No results for input(s): LABPROT, INR in the last 72 hours.  Urinalysis: No results for input(s): COLORURINE, LABSPEC, PHURINE, GLUCOSEU, HGBUR, BILIRUBINUR, KETONESUR, PROTEINUR, UROBILINOGEN, NITRITE, LEUKOCYTESUR in the last 72 hours.  Invalid input(s): APPERANCEUR    Imaging: ECHOCARDIOGRAM COMPLETE Result Date: 08/22/2024    ECHOCARDIOGRAM REPORT   Patient Name:   Kayin K Amster Date of Exam: 08/22/2024 Medical Rec #:  [id number]    Height:       69.0 in Accession #:    [id number]   Weight:       338.0 lb Date of Birth:  [birth year]     BSA:  2.582 m Patient Age:    53 years     BP:           147/76 mmHg Patient Gender: M            HR:           86 bpm. Exam Location:  [facility] Procedure: 2D Echo, Color Doppler and Cardiac Doppler (Both Spectral and Color            Flow Doppler were utilized during procedure). Indications:     CHF-Acute Diastolic I50.31  History:         Patient has prior history of Echocardiogram examinations, most                  recent 01/12/2023. CHF.  Sonographer:     Ashley McNeely-Sloane Referring Phys:  [id number] NOAH BEDFORD TNLX Diagnosing Phys: Deatrice Cage MD IMPRESSIONS  1.  Left ventricular ejection fraction, by estimation, is 55 to 60%. The left ventricle has normal function. The left ventricle has no regional wall motion abnormalities. There is moderate left ventricular hypertrophy. Left ventricular diastolic parameters are indeterminate.  2. Right ventricular systolic function is normal. The right ventricular size is normal. Tricuspid regurgitation signal is inadequate for assessing PA pressure.  3. Left atrial size was mildly dilated.  4. Right atrial size was mildly dilated.  5. The mitral valve is normal in structure. Trivial mitral valve regurgitation. Mild to moderate mitral stenosis. The mean mitral valve gradient is 8.0 mmHg. Moderate mitral annular calcification.  6. The aortic valve is calcified. Aortic valve regurgitation is not visualized. Aortic valve sclerosis/calcification is present, without any evidence of aortic stenosis. Aortic valve mean gradient measures 5.0 mmHg.  7. Aortic dilatation noted. There is mild dilatation of the aortic root, measuring 42 mm. FINDINGS  Left Ventricle: Left ventricular ejection fraction, by estimation, is 55 to 60%. The left ventricle has normal function. The left ventricle has no regional wall motion abnormalities. The left ventricular internal cavity size was normal in size. There is  moderate left ventricular hypertrophy. Left ventricular diastolic parameters are indeterminate. Right Ventricle: The right ventricular size is normal. No increase in right ventricular wall thickness. Right ventricular systolic function is normal. Tricuspid regurgitation signal is inadequate for assessing PA pressure. Left Atrium: Left atrial size was mildly dilated. Right Atrium: Right atrial size was mildly dilated. Pericardium: There is no evidence of pericardial effusion. Mitral Valve: The mitral valve is normal in structure. Moderate mitral annular calcification. Trivial mitral valve regurgitation. Mild to moderate mitral valve stenosis. MV peak  gradient, 15.8 mmHg. The mean mitral valve gradient is 8.0 mmHg. Tricuspid Valve: The tricuspid valve is normal in structure. Tricuspid valve regurgitation is not demonstrated. No evidence of tricuspid stenosis. Aortic Valve: The aortic valve is calcified. Aortic valve regurgitation is not visualized. Aortic valve sclerosis/calcification is present, without any evidence of aortic stenosis. Aortic valve mean gradient measures 5.0 mmHg. Aortic valve peak gradient measures 8.8 mmHg. Aortic valve area, by VTI measures 2.62 cm. Pulmonic Valve: The pulmonic valve was normal in structure. Pulmonic valve regurgitation is trivial. No evidence of pulmonic stenosis. Aorta: Aortic dilatation noted. There is mild dilatation of the aortic root, measuring 42 mm. Venous: The inferior vena cava was not well visualized. IAS/Shunts: No atrial level shunt detected by color flow Doppler.  LEFT VENTRICLE PLAX 2D LVIDd:         5.20 cm      Diastology LVIDs:  2.90 cm      LV e' medial:    6.96 cm/s LV PW:         1.50 cm      LV E/e' medial:  20.4 LV IVS:        1.30 cm      LV e' lateral:   7.94 cm/s LVOT diam:     2.10 cm      LV E/e' lateral: 17.9 LV SV:         66 LV SV Index:   25 LVOT Area:     3.46 cm  LV Volumes (MOD) LV vol d, MOD A2C: 108.0 ml LV vol d, MOD A4C: 123.0 ml LV vol s, MOD A2C: 44.4 ml LV vol s, MOD A4C: 51.2 ml LV SV MOD A2C:     63.6 ml LV SV MOD A4C:     123.0 ml LV SV MOD BP:      65.1 ml RIGHT VENTRICLE RV Basal diam:  4.90 cm RV Mid diam:    3.60 cm RV S prime:     12.10 cm/s TAPSE (M-mode): 2.1 cm LEFT ATRIUM           Index        RIGHT ATRIUM           Index LA diam:      4.90 cm 1.90 cm/m   RA Area:     24.00 cm LA Vol (A2C): 94.3 ml 36.52 ml/m  RA Volume:   75.30 ml  29.16 ml/m LA Vol (A4C): 50.5 ml 19.56 ml/m  AORTIC VALVE                    PULMONIC VALVE AV Area (Vmax):    2.36 cm     PV Vmax:        1.11 m/s AV Area (Vmean):   2.32 cm     PV Vmean:       77.100 cm/s AV Area (VTI):      2.62 cm     PV VTI:         0.200 m AV Vmax:           148.00 cm/s  PV Peak grad:   4.9 mmHg AV Vmean:          99.500 cm/s  PV Mean grad:   3.0 mmHg AV VTI:            0.251 m      RVOT Peak grad: 3 mmHg AV Peak Grad:      8.8 mmHg AV Mean Grad:      5.0 mmHg LVOT Vmax:         101.00 cm/s LVOT Vmean:        66.700 cm/s LVOT VTI:          0.190 m LVOT/AV VTI ratio: 0.76  AORTA Ao Root diam: 4.25 cm Ao Asc diam:  3.30 cm MITRAL VALVE MV Area (PHT): 3.48 cm     SHUNTS MV Area VTI:   1.63 cm     Systemic VTI:  0.19 m MV Peak grad:  15.8 mmHg    Systemic Diam: 2.10 cm MV Mean grad:  8.0 mmHg     Pulmonic VTI:  0.173 m MV Vmax:       1.99 m/s MV Vmean:      132.0 cm/s MV Decel Time: 218 msec MV E velocity: 142.00 cm/s MV A velocity: 99.00 cm/s MV E/A ratio:  1.43 Deatrice Cage MD Electronically signed by Deatrice Cage MD Signature Date/Time: 08/22/2024/3:47:48 PM    Final    DG Chest 2 View Result Date: 08/22/2024 EXAM: 2 VIEW(S) XRAY OF THE CHEST 08/22/2024 12:06:00 AM COMPARISON: Comparison to study dated 08/15/2024. CLINICAL HISTORY: chest pain. Patient to triage via wheelchair with complaints of worsening shortness of breath after getting up and going to the bathroom. Also endorses some chest pain. Patient states he tried his inhaler without relief and believes he may need oxygen at home. ; Patient receives dialysis T, TH, Sat and has not missed any. FINDINGS: LUNGS AND PLEURA: Increased interstitial markings, likely chronic, without frank interstitial edema. No focal pulmonary opacity. No pleural effusion. No pneumothorax. HEART AND MEDIASTINUM: No acute abnormality of the cardiac and mediastinal silhouettes. BONES AND SOFT TISSUES: No acute osseous abnormality. IMPRESSION: 1. No acute cardiopulmonary abnormality. Electronically signed by: Sriyesh Krishnan MD 08/22/2024 12:16 AM EDT RP Workstation: [id number]     Medications:      allopurinol   200 mg Oral Daily   amLODipine   10 mg Oral Daily    aspirin  EC  81 mg Oral Daily   atorvastatin   80 mg Oral Daily   carvedilol   25 mg Oral BID WC   cloNIDine   0.3 mg Oral TID   clopidogrel   75 mg Oral Daily   ezetimibe   10 mg Oral QPM   furosemide   80 mg Oral Daily   gabapentin   100 mg Oral TID   heparin  injection (subcutaneous)  5,000 Units Subcutaneous Q8H   insulin  aspart  0-5 Units Subcutaneous QHS   insulin  aspart  0-6 Units Subcutaneous TID WC   insulin  glargine-yfgn  30 Units Subcutaneous QHS   isosorbide  mononitrate  15 mg Oral Q supper   spironolactone  25 mg Oral Daily   acetaminophen  **OR** acetaminophen , albuterol , labetalol , linaclotide , ondansetron  **OR** ondansetron  (ZOFRAN ) IV, mouth rinse  Assessment/ Plan:  Kelly Key is a 53 y.o.  male with past medical conditions including hypertension, diabetes, COPD, PAD, and end-stage renal disease on hemodialysis.  Patient presents to the emergency department complaining of shortness of breath and has been admitted for Acute pulmonary edema (HCC) [J81.0] ESRD on dialysis (HCC) [N18.6, Z99.2] Acute on chronic congestive heart failure, unspecified heart failure type (HCC) [I50.9] Acute on chronic heart failure with preserved ejection fraction (HFpEF) (HCC) [I50.33] Acute on chronic heart failure (HCC) [I50.9]  [facility]/TTS/left upper AVF  Volume overload in the setting of end-stage renal disease on hemodialysis.  Received treatment yesterday, UF 3L achieved. Will receive scheduled treatment today, UF goal 3L as tolerated. Educated patient on fluid and sodium restriction. Next treatment scheduled for Thursday.   2.  Acute respiratory failure, due to increased fluid intake.  Chest x-ray negative for acute findings. Remains on room air, non labored breathing today  3. Anemia of chronic kidney disease Lab Results  Component Value Date   HGB 7.6 (L) 08/23/2024    Patient does receive Mircera at outpatient clinic.  Ordered IV Retacrit  10000 units with dialysis.  4.  Secondary Hyperparathyroidism: with outpatient labs: None available Lab Results  Component Value Date   CALCIUM  8.3 (L) 08/23/2024   CAION 1.10 (L) 11/11/2019   PHOS 5.2 (H) 08/23/2024    Phos stable. Continue Velphoro  with meals.    LOS: 1 Preethi Scantlebury 10/28/20259:25 AM

## 2024-08-23 NOTE — Progress Notes (Signed)
 Patient to be discharged home.  Patient to be transported by his wife.  IV removed with the catheter intact.  Discharge instructions given to the patient who verbalized understanding.

## 2024-08-23 NOTE — TOC Transition Note (Signed)
 Transition of Care [facility]) - Discharge Note   Patient Details  Name: Kelly Key MRN: [id number] Date of Birth: January 29, 1971  [facility] CM/SW Contact:  Lauraine [name], LCSW Phone Number: 08/23/2024, 1:46 PM   Clinical Narrative:  Patient has orders to discharge home today. No further concerns. CSW signing off.   Final next level of care: Home/Self Care Barriers to Discharge: Barriers Resolved   Patient Goals and CMS Choice            Discharge Placement                Patient to be transferred to facility by: [facility]   Patient and family notified of of transfer: 08/23/24  Discharge Plan and Services Additional resources added to the After Visit Summary for       Post Acute Care Choice: NA                               Social Drivers of Health (SDOH) Interventions SDOH Screenings   Food Insecurity: No Food Insecurity (08/22/2024)  Housing: Low Risk  (08/22/2024)  Transportation Needs: No Transportation Needs (08/22/2024)  Utilities: Not At Risk (08/22/2024)  Depression (PHQ2-9): Low Risk  (12/25/2021)  Financial Resource Strain: Low Risk  (02/20/2019)  Physical Activity: Unknown (02/20/2019)  Social Connections: Moderately Integrated (08/15/2024)  Stress: No Stress Concern Present (02/20/2019)  Tobacco Use: Low Risk  (08/22/2024)  Recent Concern: Tobacco Use - Medium Risk (08/19/2024)   [facility] from [facility]     Readmission Risk Interventions    08/22/2024    4:01 PM 08/16/2024   10:15 AM 08/04/2024   10:28 AM  Readmission Risk Prevention Plan  Transportation Screening Complete Complete Complete  HRI or Home Care Consult   Complete  Social Work Consult for Recovery Care Planning/Counseling   Complete  Palliative Care Screening   Not Applicable  Medication Review Oceanographer) Complete Complete Complete  PCP or Specialist appointment within 3-5 days of discharge Complete    SW Recovery Care/Counseling Consult Complete     Palliative Care Screening Not Applicable Not Applicable   Skilled Nursing Facility Not Applicable Not Applicable

## 2024-08-23 NOTE — Plan of Care (Signed)
  Problem: Education: Goal: Knowledge of General Education information will improve Description: Including pain rating scale, medication(s)/side effects and non-pharmacologic comfort measures 08/23/2024 1345 by Les Delon CROME, RN Outcome: Adequate for Discharge 08/23/2024 1035 by Les Delon CROME, RN Outcome: Progressing   Problem: Health Behavior/Discharge Planning: Goal: Ability to manage health-related needs will improve Outcome: Adequate for Discharge   Problem: Clinical Measurements: Goal: Ability to maintain clinical measurements within normal limits will improve Outcome: Adequate for Discharge Goal: Will remain free from infection Outcome: Adequate for Discharge Goal: Diagnostic test results will improve Outcome: Adequate for Discharge Goal: Respiratory complications will improve Outcome: Adequate for Discharge Goal: Cardiovascular complication will be avoided Outcome: Adequate for Discharge   Problem: Activity: Goal: Risk for activity intolerance will decrease Outcome: Adequate for Discharge   Problem: Nutrition: Goal: Adequate nutrition will be maintained Outcome: Adequate for Discharge   Problem: Coping: Goal: Level of anxiety will decrease Outcome: Adequate for Discharge   Problem: Elimination: Goal: Will not experience complications related to bowel motility Outcome: Adequate for Discharge Goal: Will not experience complications related to urinary retention Outcome: Adequate for Discharge   Problem: Pain Managment: Goal: General experience of comfort will improve and/or be controlled Outcome: Adequate for Discharge   Problem: Safety: Goal: Ability to remain free from injury will improve Outcome: Adequate for Discharge   Problem: Skin Integrity: Goal: Risk for impaired skin integrity will decrease Outcome: Adequate for Discharge   Problem: Education: Goal: Ability to demonstrate management of disease process will improve Outcome:  Adequate for Discharge Goal: Ability to verbalize understanding of medication therapies will improve Outcome: Adequate for Discharge Goal: Individualized Educational Video(s) Outcome: Adequate for Discharge   Problem: Activity: Goal: Capacity to carry out activities will improve Outcome: Adequate for Discharge   Problem: Cardiac: Goal: Ability to achieve and maintain adequate cardiopulmonary perfusion will improve Outcome: Adequate for Discharge   Problem: Education: Goal: Ability to describe self-care measures that may prevent or decrease complications (Diabetes Survival Skills Education) will improve Outcome: Adequate for Discharge Goal: Individualized Educational Video(s) Outcome: Adequate for Discharge   Problem: Coping: Goal: Ability to adjust to condition or change in health will improve Outcome: Adequate for Discharge   Problem: Fluid Volume: Goal: Ability to maintain a balanced intake and output will improve Outcome: Adequate for Discharge   Problem: Health Behavior/Discharge Planning: Goal: Ability to identify and utilize available resources and services will improve Outcome: Adequate for Discharge Goal: Ability to manage health-related needs will improve Outcome: Adequate for Discharge   Problem: Metabolic: Goal: Ability to maintain appropriate glucose levels will improve Outcome: Adequate for Discharge   Problem: Nutritional: Goal: Maintenance of adequate nutrition will improve Outcome: Adequate for Discharge Goal: Progress toward achieving an optimal weight will improve Outcome: Adequate for Discharge   Problem: Skin Integrity: Goal: Risk for impaired skin integrity will decrease Outcome: Adequate for Discharge   Problem: Tissue Perfusion: Goal: Adequacy of tissue perfusion will improve Outcome: Adequate for Discharge

## 2024-08-24 ENCOUNTER — Encounter (INDEPENDENT_AMBULATORY_CARE_PROVIDER_SITE_OTHER): Payer: Self-pay | Admitting: Ophthalmology

## 2024-08-24 ENCOUNTER — Ambulatory Visit (INDEPENDENT_AMBULATORY_CARE_PROVIDER_SITE_OTHER): Admitting: Ophthalmology

## 2024-08-24 DIAGNOSIS — F419 Anxiety disorder, unspecified: Secondary | ICD-10-CM

## 2024-08-24 DIAGNOSIS — H25013 Cortical age-related cataract, bilateral: Secondary | ICD-10-CM

## 2024-08-24 DIAGNOSIS — E113413 Type 2 diabetes mellitus with severe nonproliferative diabetic retinopathy with macular edema, bilateral: Secondary | ICD-10-CM | POA: Diagnosis not present

## 2024-08-24 DIAGNOSIS — Z794 Long term (current) use of insulin: Secondary | ICD-10-CM | POA: Diagnosis not present

## 2024-08-24 DIAGNOSIS — H35033 Hypertensive retinopathy, bilateral: Secondary | ICD-10-CM

## 2024-08-24 DIAGNOSIS — I10 Essential (primary) hypertension: Secondary | ICD-10-CM

## 2024-08-28 ENCOUNTER — Encounter (INDEPENDENT_AMBULATORY_CARE_PROVIDER_SITE_OTHER): Payer: Self-pay | Admitting: Ophthalmology

## 2024-09-05 NOTE — Progress Notes (Signed)
 [facility] - Clinic Note  09/14/2024   CHIEF COMPLAINT Patient presents for Retina Follow Up  HISTORY OF PRESENT ILLNESS: Kelly Key is a 53 y.o. male who presents to the clinic today for:  HPI     Retina Follow Up   Patient presents with  Diabetic Retinopathy.  In both eyes.  This started 2 weeks ago.  Duration of 2 weeks.  Since onset it is stable.  I, the attending physician,  performed the HPI with the patient and updated documentation appropriately.        Comments   2 week retina follow up NPDR and IVA OD pt is reporting no vision changes noticed he denies any flashes or floaters last reading 223 this am       Last edited by Valdemar Rogue, MD on 09/14/2024 12:07 PM.    Pt states    Referring physician: No referring provider defined for this encounter.  HISTORICAL INFORMATION:  Selected notes from the MEDICAL RECORD NUMBER Referred by Dr. Heron [facility]) for diabetic retinopathy LEE:  Ocular Hx- PMH-   CURRENT MEDICATIONS: No current outpatient medications on file. (Ophthalmic Drugs)   No current facility-administered medications for this visit. (Ophthalmic Drugs)   Current Outpatient Medications (Other)  Medication Sig   acetaminophen  (TYLENOL ) 500 MG tablet Take 1,000 mg by mouth every 6 (six) hours as needed for moderate pain or headache.   albuterol  (VENTOLIN  HFA) 108 (90 Base) MCG/ACT inhaler Inhale 2 puffs into the lungs every 4 (four) hours as needed for wheezing or shortness of breath.   allopurinol  (ZYLOPRIM ) 100 MG tablet Take 2 tablets (200 mg total) by mouth daily.   amLODipine  (NORVASC ) 10 MG tablet Take 1 tablet (10 mg total) by mouth daily.   aspirin  EC 81 MG tablet Take 1 tablet (81 mg total) by mouth daily.   atorvastatin  (LIPITOR ) 80 MG tablet Take 1 tablet (80 mg total) by mouth daily.   carvedilol  (COREG ) 25 MG tablet Take 1 tablet (25 mg total) by mouth 2 (two) times daily with a meal.   cloNIDine   (CATAPRES ) 0.3 MG tablet Take 1 tablet (0.3 mg total) by mouth 3 (three) times daily.   clopidogrel  (PLAVIX ) 75 MG tablet Take 75 mg by mouth daily.   ezetimibe  (ZETIA ) 10 MG tablet Take 10 mg by mouth every evening.   fluticasone  (FLONASE ) 50 MCG/ACT nasal spray Place 1 spray into both nostrils at bedtime as needed for allergies or rhinitis.   furosemide  (LASIX ) 80 MG tablet Take 1 tablet (80 mg total) by mouth daily.   gabapentin  (NEURONTIN ) 100 MG capsule Take 1 capsule (100 mg total) by mouth 3 (three) times daily.   glucose blood (ACCU-CHEK AVIVA PLUS) test strip Use as instructed TO CHECK BLOOD GLUCOSE THREE TIMES DAILY   HUMALOG KWIKPEN 100 UNIT/ML KwikPen Inject 8-30 Units into the skin 3 (three) times daily. Sliding scale 30 units max TID   Insulin  Pen Needle (B-D ULTRAFINE III SHORT PEN) 31G X 8 MM MISC 1 each by Does not apply route as directed.   Ipratropium-Albuterol  (COMBIVENT ) 20-100 MCG/ACT AERS respimat Inhale 1 puff into the lungs every 6 (six) hours as needed for wheezing or shortness of breath.   isosorbide  mononitrate (IMDUR ) 30 MG 24 hr tablet Take 0.5 tablets (15 mg total) by mouth daily with supper.   linaclotide  (LINZESS ) 145 MCG CAPS capsule Take 145 mcg by mouth daily as needed (constipation).   multivitamin (RENA-VIT) TABS  tablet Take 1 tablet by mouth at bedtime.   pantoprazole  (PROTONIX ) 40 MG tablet Take 1 tablet (40 mg total) by mouth daily.   spironolactone  (ALDACTONE ) 25 MG tablet Take 1 tablet (25 mg total) by mouth daily.   TRESIBA  FLEXTOUCH 100 UNIT/ML FlexTouch Pen Inject 30 Units into the skin at bedtime.   VELPHORO  500 MG chewable tablet Chew 500 mg by mouth 3 (three) times daily.   No current facility-administered medications for this visit. (Other)   REVIEW OF SYSTEMS: ROS   Positive for: Gastrointestinal, Endocrine, Eyes, Respiratory Negative for: Constitutional, Neurological, Skin, Genitourinary, Musculoskeletal, HENT, Cardiovascular, Psychiatric,  Allergic/Imm, Heme/Lymph Last edited by Resa Delon ORN, COT on 09/14/2024  9:15 AM.     ALLERGIES Allergies  Allergen Reactions   Hydrochlorothiazide Swelling   Ozempic  (0.25 Or 0.5 Mg-Dose) [Semaglutide (0.25 Or 0.5mg -Dos)] Shortness Of Breath   Hydralazine  Anxiety and Other (See Comments)    Hallucinations    Vitamin K And Related    Lisinopril Swelling   PAST MEDICAL HISTORY Past Medical History:  Diagnosis Date   Acute diastolic CHF (congestive heart failure) (HCC) 06/11/2012   EF 50-55% [facility])   Acute gout of right knee 08/03/2019   Chronic kidney disease    COPD (chronic obstructive pulmonary disease) (HCC)    Diabetes mellitus    A1c 11.5 (06/11/2012).   Dyspnea    Gout    Hepatic steatosis 06/11/2012   Elevated LFTs   Hyperlipemia    Malignant hypertension    Microcytic anemia 06/12/2012   NSTEMI (non-ST elevated myocardial infarction) (HCC) 05/17/2021   Obesity    Thromboembolism (HCC) 02/20/2019   Past Surgical History:  Procedure Laterality Date   ABDOMINAL AORTOGRAM W/LOWER EXTREMITY N/A 12/01/2022   Procedure: ABDOMINAL AORTOGRAM W/LOWER EXTREMITY;  Surgeon: Gretta Lonni PARAS, MD;  Location: [facility] INVASIVE CV LAB;  Service: Cardiovascular;  Laterality: N/A;   AMPUTATION Left 12/03/2022   Procedure: LEFT SECOND TOE AMPUTATION;  Surgeon: Harden Jerona GAILS, MD;  Location: [facility] OR;  Service: Orthopedics;  Laterality: Left;   AV FISTULA PLACEMENT Left 11/11/2019   Procedure: ARTERIOVENOUS (AV) FISTULA CREATION LEFT BRACHIOCEPHALIC ARM;  Surgeon: Serene Gaile ORN, MD;  Location: [facility] OR;  Service: Vascular;  Laterality: Left;   COLONOSCOPY WITH PROPOFOL  N/A 09/02/2021   Procedure: COLONOSCOPY WITH PROPOFOL ;  Surgeon: Therisa Bi, MD;  Location: [facility] ENDOSCOPY;  Service: Gastroenterology;  Laterality: N/A;   IR FLUORO GUIDE CV LINE RIGHT  07/08/2019   IR REMOVAL TUN CV CATH W/O FL  03/23/2020   IR US  GUIDE VASC ACCESS RIGHT  07/08/2019   LEFT HEART CATH AND  CORONARY ANGIOGRAPHY N/A 06/12/2021   Procedure: LEFT HEART CATH AND CORONARY ANGIOGRAPHY;  Surgeon: Claudene Victory ORN, MD;  Location: [facility] INVASIVE CV LAB;  Service: Cardiovascular;  Laterality: N/A;   NO PAST SURGERIES     PERIPHERAL VASCULAR BALLOON ANGIOPLASTY Left 12/01/2022   Procedure: PERIPHERAL VASCULAR BALLOON ANGIOPLASTY;  Surgeon: Gretta Lonni PARAS, MD;  Location: [facility] INVASIVE CV LAB;  Service: Cardiovascular;  Laterality: Left;  Anterior tibial   PERIPHERAL VASCULAR INTERVENTION Left 12/01/2022   Procedure: PERIPHERAL VASCULAR INTERVENTION;  Surgeon: Gretta Lonni PARAS, MD;  Location: [facility] INVASIVE CV LAB;  Service: Cardiovascular;  Laterality: Left;  sfa   VASCULAR SURGERY     FAMILY HISTORY Family History  Problem Relation Age of Onset   Gout Mother    Asthma Mother    Diabetes Father    Heart failure Father    Diabetes Sister  Hypertension Brother    Pancreatic cancer Brother    Diabetes Sister    SOCIAL HISTORY Social History   Tobacco Use   Smoking status: Never    Passive exposure: Past   Smokeless tobacco: Never  Vaping Use   Vaping status: Never Used  Substance Use Topics   Alcohol use: Not Currently    Comment: haven't drank in over 5 years    Drug use: No       OPHTHALMIC EXAM:  Base Eye Exam     Visual Acuity (Snellen - Linear)       Right Left   Dist [place] 20/40 -1 20/30 -2   Dist ph [place] 20/25 -2 20/25 -2         Tonometry (Tonopen, 9:24 AM)       Right Left   Pressure 20 20  I gtts combigan and brom        Pupils       Pupils Dark Light Shape React APD   Right PERRL 3 2 Round Brisk None   Left PERRL 3 2 Round Brisk None         Visual Fields       Left Right    Full          Extraocular Movement       Right Left    Full, Ortho Full, Ortho         Neuro/Psych     Oriented x3: Yes   Mood/Affect: Normal         Dilation     Both eyes: 2.5% Phenylephrine  @ 9:24 AM           Slit Lamp and Fundus Exam      External Exam       Right Left   External Normal Normal         Slit Lamp Exam       Right Left   Lids/Lashes Dermatochalasis - upper lid, mild MGD Dermatochalasis - upper lid   Conjunctiva/Sclera Mild Melanosis Mild Melanosis   Cornea Arcus arcus, tear film debris   Anterior Chamber Deep and clear Deep and clear   Iris No NVI, Round and dilated No NVI, Round and dilated   Lens 2+ Nuclear sclerosis, 2-3+ Cortical cataract 2+ Nuclear sclerosis, 2-3+ Cortical cataract   Anterior Vitreous Vitreous syneresis Vitreous syneresis         Fundus Exam       Right Left   Disc Pink and sharp, mild PPA Pink and sharp, mild PPA   C/D Ratio 0.6 0.5   Macula Flat, good foveal reflex, central cystic changes, focal cluster of DBH just temporal to fovea w/ persistent swelling, scattered MA/DBH greatest inferotemporal macula Flat, Blunted foveal reflex, central cystic changes -- improved, scattered MA/DBH, good focal laser changes, scattered non central cystic changes greatest IT mac-- slightly improved   Vessels attenuated, Tortuous, A/V crossing changes attenuated, Tortuous   Periphery Attached, scattered MA/DBH Attached, scattered MA/DBH--greatest posteriorly           Refraction     Wearing Rx       Sphere Cylinder Axis   Right -0.25 +1.50 020   Left [place] +1.25 154           IMAGING AND PROCEDURES  Imaging and Procedures for 09/14/2024  OCT, Retina - OU - Both Eyes       Right Eye Quality was good. Central Foveal Thickness: 265. Progression has been stable. Findings include  normal foveal contour, no SRF, intraretinal hyper-reflective material, intraretinal fluid, vitreomacular adhesion (Stable improvement in central IRF and SRF, scattered punctate IRHM; persistent cystic changes temporal macula).   Left Eye Quality was good. Central Foveal Thickness: 279. Progression has been stable. Findings include normal foveal contour, no SRF, intraretinal hyper-reflective material,  intraretinal fluid, vitreomacular adhesion (Persistent IRF/cystic changes IT macula--slightly improved, stable improvement in central cystic changes).   Notes *Images captured and stored on drive  Diagnosis / Impression:  OD: Stable improvement in central IRF and SRF, scattered punctate IRHM; persistent cystic changes temporal macula OS: Persistent IRF/cystic changes IT macula--slightly improved, stable improvement in central cystic changes  Clinical management:  See below  Abbreviations: NFP - Normal foveal profile. CME - cystoid macular edema. PED - pigment epithelial detachment. IRF - intraretinal fluid. SRF - subretinal fluid. EZ - ellipsoid zone. ERM - epiretinal membrane. ORA - outer retinal atrophy. ORT - outer retinal tubulation. SRHM - subretinal hyper-reflective material. IRHM - intraretinal hyper-reflective material      Intravitreal Injection, Pharmacologic Agent - OD - Right Eye       Time Out 09/14/2024. 10:04 AM. Confirmed correct patient, procedure, site, and patient consented.   Anesthesia Topical anesthesia was used. Anesthetic medications included Lidocaine  2%, Proparacaine 0.5%.   Procedure Preparation included 5% betadine  to ocular surface, eyelid speculum. A (32g) needle was used.   Injection: 1.25 mg Bevacizumab  1.25mg /0.45ml   Route: Intravitreal, Site: Right Eye   NDC: [id number], Lot: [id number], Expiration date: 12/17/2024   Post-op Post injection exam found visual acuity of at least counting fingers. The patient tolerated the procedure well. There were no complications. The patient received written and verbal post procedure care education.           ASSESSMENT/PLAN:   ICD-10-CM   1. Severe nonproliferative diabetic retinopathy of both eyes with macular edema associated with type 2 diabetes mellitus (HCC)  E11.3413 OCT, Retina - OU - Both Eyes    Intravitreal Injection, Pharmacologic Agent - OD - Right Eye    Bevacizumab  (AVASTIN ) SOLN 1.25 mg     CANCELED: Intravitreal Injection, Pharmacologic Agent - OS - Left Eye    2. Encounter for long-term (current) use of insulin  (HCC)  Z79.4     3. Essential hypertension  I10     4. Hypertensive retinopathy of both eyes  H35.033     5. Cortical age-related cataract of both eyes  H25.013     6. Anxiety  F41.9      1,2. Severe Non-proliferative diabetic retinopathy, both eyes - A1c 9.1 (10.09.25), 9.5 (11.26.24); 7.4 (12.23.23) - s/p IVA OD #1 (08.28.24), #2 (09.25.24), #3 (10.23.24) - s/p focal laser OS (01.08.25) - exam shows scattered MAs OS -- improved post focal laser - FA (08.28.24) OD: Mild patches of capillary non perfusion peripherally, Late leaking MA, hyperfluorescence of disc, focal perifoveal hyperfluorescence leakage, no NV, OS: Mild patches of capillary non perfusion peripherally, Late leaking MA, no NV - BCVA OU 20/25 from 20/20 - OCT shows OD: Stable improvement in central IRF and SRF, scattered punctate IRHM; persistent cystic changes temporal macula, OS: Persistent IRF/cystic changes IT macula--slightly improved, stable improvement in central cystic changes at 4 weeks - recommend IVA OD #4 today, 11.19.25 w/ f/u in 4 weeks - RBA of procedure discussed, questions answered - informed consent obtained and signed for IVA OU 11.19.25 - see procedure note  - f/u in 4 weeks -- DFE/OCT, possible injection  3,4. Hypertensive retinopathy OU - discussed importance  of tight BP control - monitor   5.  Mixed Cataract OU - The symptoms of cataract, surgical options, and treatments and risks were discussed with patient. - discussed diagnosis and progression - monitor   6. Anxiety  - affecting ophthalmic examinations and treatments - may benefit from mild anxiolytic prior to treatment visits (intravitreal injections and lasers)  - recommend discussing with primary care provider  Ophthalmic Meds Ordered this visit:  Meds ordered this encounter  Medications   Bevacizumab   (AVASTIN ) SOLN 1.25 mg     Return in about 4 weeks (around 10/12/2024) for f/u, NPDR, DFE, OCT, Possible, IVA, OD.  There are no Patient Instructions on file for this visit.  Explained the diagnoses, plan, and follow up with the patient and they expressed understanding.  Patient expressed understanding of the importance of proper follow up care.   This document serves as a record of services personally performed by Redell JUDITHANN Hans, MD, PhD. It was created on their behalf by Almetta Pesa, an ophthalmic technician. The creation of this record is the provider's dictation and/or activities during the visit.    Electronically signed by: Almetta Pesa, OA, 09/18/24  7:43 PM  This document serves as a record of services personally performed by Redell JUDITHANN Hans, MD, PhD. It was created on their behalf by Wanda GEANNIE Keens, COT an ophthalmic technician. The creation of this record is the provider's dictation and/or activities during the visit.    Electronically signed by:  Wanda GEANNIE Keens, COT  09/18/24 7:43 PM  Redell JUDITHANN Hans, M.D., Ph.D. Diseases & Surgery of the Retina and Vitreous [facility] 09/14/2024  I have reviewed the above documentation for accuracy and completeness, and I agree with the above. Redell JUDITHANN Hans, M.D., Ph.D. 09/18/24 7:45 PM   Abbreviations: M myopia (nearsighted); A astigmatism; H hyperopia (farsighted); P presbyopia; Mrx spectacle prescription;  CTL contact lenses; OD right eye; OS left eye; OU both eyes  XT exotropia; ET esotropia; PEK punctate epithelial keratitis; PEE punctate epithelial erosions; DES dry eye syndrome; MGD meibomian gland dysfunction; ATs artificial tears; PFAT's preservative free artificial tears; NSC nuclear sclerotic cataract; PSC posterior subcapsular cataract; ERM epi-retinal membrane; PVD posterior vitreous detachment; RD retinal detachment; DM diabetes mellitus; DR diabetic retinopathy; NPDR non-proliferative  diabetic retinopathy; PDR proliferative diabetic retinopathy; CSME clinically significant macular edema; DME diabetic macular edema; dbh dot blot hemorrhages; CWS cotton wool spot; POAG primary open angle glaucoma; C/D cup-to-disc ratio; HVF humphrey visual field; GVF goldmann visual field; OCT optical coherence tomography; IOP intraocular pressure; BRVO Branch retinal vein occlusion; CRVO central retinal vein occlusion; CRAO central retinal artery occlusion; BRAO branch retinal artery occlusion; RT retinal tear; SB scleral buckle; PPV pars plana vitrectomy; VH Vitreous hemorrhage; PRP panretinal laser photocoagulation; IVK intravitreal kenalog; VMT vitreomacular traction; MH Macular hole;  NVD neovascularization of the disc; NVE neovascularization elsewhere; AREDS age related eye disease study; ARMD age related macular degeneration; POAG primary open angle glaucoma; EBMD epithelial/anterior basement membrane dystrophy; ACIOL anterior chamber intraocular lens; IOL intraocular lens; PCIOL posterior chamber intraocular lens; Phaco/IOL phacoemulsification with intraocular lens placement; PRK photorefractive keratectomy; LASIK laser assisted in situ keratomileusis; HTN hypertension; DM diabetes mellitus; COPD chronic obstructive pulmonary disease

## 2024-09-14 ENCOUNTER — Ambulatory Visit (INDEPENDENT_AMBULATORY_CARE_PROVIDER_SITE_OTHER): Admitting: Ophthalmology

## 2024-09-14 ENCOUNTER — Encounter (INDEPENDENT_AMBULATORY_CARE_PROVIDER_SITE_OTHER): Payer: Self-pay | Admitting: Ophthalmology

## 2024-09-14 DIAGNOSIS — Z794 Long term (current) use of insulin: Secondary | ICD-10-CM

## 2024-09-14 DIAGNOSIS — H25013 Cortical age-related cataract, bilateral: Secondary | ICD-10-CM

## 2024-09-14 DIAGNOSIS — E113413 Type 2 diabetes mellitus with severe nonproliferative diabetic retinopathy with macular edema, bilateral: Secondary | ICD-10-CM

## 2024-09-14 DIAGNOSIS — I10 Essential (primary) hypertension: Secondary | ICD-10-CM | POA: Diagnosis not present

## 2024-09-14 DIAGNOSIS — H35033 Hypertensive retinopathy, bilateral: Secondary | ICD-10-CM | POA: Diagnosis not present

## 2024-09-14 DIAGNOSIS — F419 Anxiety disorder, unspecified: Secondary | ICD-10-CM

## 2024-09-14 MED ORDER — BEVACIZUMAB CHEMO INJECTION 1.25MG/0.05ML SYRINGE FOR KALEIDOSCOPE
1.2500 mg | INTRAVITREAL | Status: AC | PRN
  Administered 2024-09-14: 1.25 mg via INTRAVITREAL

## 2024-09-21 ENCOUNTER — Telehealth: Payer: Self-pay | Admitting: Nurse Practitioner

## 2024-09-21 NOTE — Telephone Encounter (Signed)
 Pt significant other called in asking if pt needs labs prior to appt Monday 12/1

## 2024-09-21 NOTE — Telephone Encounter (Signed)
 Pt advised that provider did not order lab work at last OV- patient had lab work completed by other providers in October. If provider feels that pt will need new lab work completed labs can be ordered the day of his OV. Pt verbalized understanding and had no further questions or concerns

## 2024-09-26 ENCOUNTER — Ambulatory Visit: Attending: Nurse Practitioner | Admitting: Nurse Practitioner

## 2024-09-26 ENCOUNTER — Encounter: Payer: Self-pay | Admitting: Nurse Practitioner

## 2024-09-26 ENCOUNTER — Telehealth: Payer: Self-pay | Admitting: Nurse Practitioner

## 2024-09-26 VITALS — BP 136/82 | HR 74 | Ht 69.0 in | Wt 334.8 lb

## 2024-09-26 DIAGNOSIS — I5032 Chronic diastolic (congestive) heart failure: Secondary | ICD-10-CM | POA: Diagnosis not present

## 2024-09-26 DIAGNOSIS — I10 Essential (primary) hypertension: Secondary | ICD-10-CM

## 2024-09-26 DIAGNOSIS — I050 Rheumatic mitral stenosis: Secondary | ICD-10-CM | POA: Diagnosis not present

## 2024-09-26 DIAGNOSIS — I739 Peripheral vascular disease, unspecified: Secondary | ICD-10-CM

## 2024-09-26 DIAGNOSIS — I77819 Aortic ectasia, unspecified site: Secondary | ICD-10-CM

## 2024-09-26 DIAGNOSIS — N186 End stage renal disease: Secondary | ICD-10-CM

## 2024-09-26 DIAGNOSIS — E785 Hyperlipidemia, unspecified: Secondary | ICD-10-CM

## 2024-09-26 DIAGNOSIS — J449 Chronic obstructive pulmonary disease, unspecified: Secondary | ICD-10-CM

## 2024-09-26 NOTE — Telephone Encounter (Signed)
 1. Which medications need to be refilled? (please list name of each medication and dose if known)  amlodipine    2. Which pharmacy/location (including street and city if local pharmacy) is medication to be sent to?[facility] [place]  3. Do they need a 30 day or 90 day supply? 90

## 2024-09-26 NOTE — Patient Instructions (Signed)
 Medication Instructions:  Your physician recommends that you continue on your current medications as directed. Please refer to the Current Medication list given to you today.  Monitor Blood Pressure for 2-3 weeks and drop off readings at office or send via MyChart.   *If you need a refill on your cardiac medications before your next appointment, please call your pharmacy*  Lab Work: NONE   If you have labs (blood work) drawn today and your tests are completely normal, you will receive your results only by: MyChart Message (if you have MyChart) OR A paper copy in the mail If you have any lab test that is abnormal or we need to change your treatment, we will call you to review the results.  Testing/Procedures: NONE   Follow-Up: At [facility], you and your health needs are our priority.  As part of our continuing mission to provide you with exceptional heart care, our providers are all part of one team.  This team includes your primary Cardiologist (physician) and Advanced Practice Providers or APPs (Physician Assistants and Nurse Practitioners) who all work together to provide you with the care you need, when you need it.  Your next appointment:   3 month(s)  Provider:   Almarie Crate, NP    We recommend signing up for the patient portal called MyChart.  Sign up information is provided on this After Visit Summary.  MyChart is used to connect with patients for Virtual Visits (Telemedicine).  Patients are able to view lab/test results, encounter notes, upcoming appointments, etc.  Non-urgent messages can be sent to your provider as well.   To learn more about what you can do with MyChart, go to [email or web address].   Other Instructions Thank you for choosing [facility]!

## 2024-09-26 NOTE — Progress Notes (Addendum)
 Cardiology Office Note:  .   Date: 09/26/2024 ID:  Kelly Key, DOB [birth year], MRN [id number] PCP: Luke Agent, MD (Inactive)  [facility] Providers Cardiologist:  Alvan Carrier, MD    History of Present Illness: .   Kelly Key is a 53 y.o. male with a PMH of HFpEF, hypertension, type 2 diabetes, chronic IDA, ESRD on hemodialysis (Tuesday, Thursday, Saturday schedule), HLD, COPD, and morbid obesity, who presents today for scheduled follow-up.  Previous cardiovascular history includes NSTEMI in 2022. Cardiac catheterization revealed 60% proximal to mid RCA, eccentric 40% PDA distal stenosis.  Ostial LAD 40% stenosis.  Minimal disease in distal left main with widely patent circumflex.  Medical management recommended.  He has not been seen by outpatient cardiology since 2022.  Past history of left SFA stenting to heal left second toe amputation.  Followed by [facility].  Within the past year he has had several ED visits and hospital admissions.  Seen at [facility] ED on September 24, 2023 after a fall.  Said he fell out of bed and hit his head, denied LOC.  He was advised to go to the ED to have a CT of his head performed.  Imaging was negative for anything acute.  Most recently he has was hospitalized in December 2024 for due to acute hypoxic respiratory failure that was likely secondary to combined effect from acute COPD exacerbation as well as acute on chronic HFpEF decompensation.  11/27/2023 - Today he presents for follow-up with his wife.  He tells me he has had issues with fluid buildup recently.  Says he gets short of breath when this occurs.  Does admit to low BPs happening at times with low SBP reported to be in 60s, says he has to be careful with how he positions his feet.  I do not have BP log to evaluate his BP trends recently. Denies any chest pain, palpitations, syncope, presyncope, dizziness, orthopnea, PND, acute bleeding, or claudication.  01/01/2024 -presents for  follow-up today with his wife.  Presents his home BP log that reveals labile BPs.  Majority of readings are not at goal, SBP averaging 140s to 160s.  Does state that he takes his fluid medication after dialysis as sometimes his BP drops after hemodialysis treatments.  No episodes of hypotension noted on BP log. Goes to HD on Tuesdays, Thursdays, and Saturdays.  Weight log is also presented today and overall reveals stable weights.  Per our records, his weight is only down around 1 pound from last office visit. Denies any chest pain, worsening shortness of breath, palpitations, syncope, presyncope, dizziness, orthopnea, PND, acute bleeding, or claudication.  02/29/2024 - Presents today for follow-up with wife. Remains compliant with medications. Admits to labile weights. Brings in log from home that shows overall stable weights since last office visit, however, weight is almost up 10 lbs since last office visit. Pt is fatigued during interview today and attributes this to his insulin . BP is overall improved since last office visit, does admit to BP dropping during at times, but rare per his report when this happens, denies any recent issues with his BP per his report. Denies any chest pain, shortness of breath, palpitations, syncope, presyncope, dizziness, orthopnea, PND, significant weight changes, acute bleeding, or claudication.  04/04/2024 - Here for follow-up. Doing well and weight is down from last office visit. Does have some labile BP readings. Compliant with his medication regimen. Denies any chest pain, shortness of breath, palpitations, syncope, presyncope, dizziness, orthopnea,  PND, swelling or significant weight changes, acute bleeding, or claudication. Took AM medications about 30 minutes ago.   06/20/2024 -here for follow-up and doing well.  Weights are stable and shows me his blood pressure log with labile blood pressures, lower readings noted post dialysis.  Per review of his report from home, I do  not see any hypotensive episodes. Denies any chest pain, shortness of breath, palpitations, syncope, presyncope, dizziness, orthopnea, PND, swelling or significant weight changes, acute bleeding, or claudication.  Since last office visit, several hospital admissions for CAP and COPD exacerbation in October 2025. Re-hospitalized later that month for volume overload. Had not been compliant with fluid restriction. Received dialysis and was returned to dry weight, was medically stable for d/c later.   Most recently, hospitalized on 08/22/2024, presented with fluid overload. Missed HD session, not found to be fully complaint with fluid restriction. Received HD, symptoms improved. TTE updated and showed LVEF that was normal, mild/mod mitral stenosis, otherwise no significant changes.  09/26/2024 - here for follow-up with his wife. Weights have been stable. Overall doing well. Says he has been compliant with his medications. Denies any chest pain, shortness of breath, palpitations, syncope, presyncope, dizziness, orthopnea, PND, swelling or significant weight changes, acute bleeding, or claudication.  ROS: Negative.  See HPI.  Studies Reviewed: Kelly Key    EKG: EKG is not ordered today.   Echo 07/2024:   1. Left ventricular ejection fraction, by estimation, is 55 to 60%. The  left ventricle has normal function. The left ventricle has no regional  wall motion abnormalities. There is moderate left ventricular hypertrophy.  Left ventricular diastolic  parameters are indeterminate.   2. Right ventricular systolic function is normal. The right ventricular  size is normal. Tricuspid regurgitation signal is inadequate for assessing  PA pressure.   3. Left atrial size was mildly dilated.   4. Right atrial size was mildly dilated.   5. The mitral valve is normal in structure. Trivial mitral valve  regurgitation. Mild to moderate mitral stenosis. The mean mitral valve  gradient is 8.0 mmHg. Moderate mitral annular  calcification.   6. The aortic valve is calcified. Aortic valve regurgitation is not  visualized. Aortic valve sclerosis/calcification is present, without any  evidence of aortic stenosis. Aortic valve mean gradient measures 5.0 mmHg.   7. Aortic dilatation noted. There is mild dilatation of the aortic root,  measuring 42 mm.  ABI's 02/24/2024:  Summary:  Right: Resting right ankle-brachial index is within normal range. The  right toe-brachial index is abnormal.   Left: Resting left ankle-brachial index is within normal range. The left  toe-brachial index is abnormal.    *See table(s) above for measurements and observations.  See LE Arterial duplex report.  Lower extremity arterial duplex left 02/24/2024:  Summary:  Left: Mild progression is noted compared to previous study.   Atherosclerosis throughout.  Patent SFA stent with mildly elevated velocities consistent with 1-49%  stenosis at pre stent segment.  Mid ATA occlusion, s/p angioplasty.     See table(s) above for measurements and observations.  See ABI report.   Suggest Peripheral Vascular Consult.  Echo 12/2022:  1. Left ventricular ejection fraction, by estimation, is 60 to 65%. The  left ventricle has normal function. The left ventricle has no regional  wall motion abnormalities. There is moderate left ventricular hypertrophy.  Left ventricular diastolic  parameters are consistent with Grade II diastolic dysfunction  (pseudonormalization).   2. Right ventricular systolic function is normal. The right  ventricular  size is normal. There is normal pulmonary artery systolic pressure. The  estimated right ventricular systolic pressure is 27.8 mmHg.   3. Left atrial size was severely dilated.   4. The mitral valve is abnormal. Trivial mitral valve regurgitation. No  evidence of mitral stenosis. Moderate to severe mitral annular  calcification.   5. The aortic valve is tricuspid. There is mild calcification of the   aortic valve. Aortic valve regurgitation is not visualized. Mild aortic  valve stenosis. Aortic valve mean gradient measures 12.0 mmHg. Aortic  valve Vmax measures 2.34 m/s.   6. Aortic dilatation noted. There is moderate dilatation of the aortic  root, measuring 42 mm. There is moderate dilatation of the ascending  aorta, measuring 44 mm.   7. The inferior vena cava is dilated in size with <50% respiratory  variability, suggesting right atrial pressure of 15 mmHg.   Comparison(s): No significant change from prior study.  LHC 05/2021:    60% proximal to mid RCA, eccentric 40% PDA distal stenosis.   Less than 25% distal left main.   Ostial LAD with eccentric 40% stenosis.   Widely patent circumflex   Upper normal LV end-diastolic pressure.  Contrast ventriculography was not performed.   Aggressive risk factor modification including tight blood pressure control, LDL less than 70 and preferably less than 55, weight loss, evaluation for sleep apnea, and diabetes control. High risk for future cardiac events. Does not have critical coronary disease and could be a candidate for kidney transplantation with risk factor modification going forward.   Physical Exam:   VS:  BP 136/82 (BP Location: Right Arm, Cuff Size: Large)   Pulse 74   Ht 5' 9 (1.753 m)   Wt (!) 334 lb 12.8 oz (151.9 kg)   SpO2 98%   BMI 49.44 kg/m    Wt Readings from Last 3 Encounters:  09/26/24 (!) 334 lb 12.8 oz (151.9 kg)  08/23/24 (!) 337 lb 11.9 oz (153.2 kg)  08/16/24 (!) 331 lb 5.6 oz (150.3 kg)    GEN: Morbidly obese, 53 year old male in no acute distress NECK: No JVD; No carotid bruits CARDIAC: S1/S2, RRR, no murmurs, rubs, gallops RESPIRATORY:  Clear to auscultation without rales, wheezing or rhonchi  ABDOMEN: Soft, non-tender, non-distended EXTREMITIES:  No significant edema to BLE; No deformity   ASSESSMENT AND PLAN: .    Chronic diastolic CHF Stage C, NYHA class II-III symptoms. EF 60-65% 12/2022.  Weight is stable.  Could not take Torsemide  d/t insurance. Continue current medication regimen. Low sodium diet, fluid restriction <2L, and daily weights encouraged. Educated to contact our office for weight gain of 2 lbs overnight or 5 lbs in one week.  GDMT is limited due to hx of ESRD. Care and ED precautions discussed.   Hypertension Blood pressure borderline elevated today, BP does drop during dialysis per his report. BP readings overall labile. No medication changes at this time. Discussed to monitor BP at home at least 2 hours after medications and sitting for 5-10 minutes.  Also discussed to log BP trends before and after hemodialysis. GDMT limited due to his ESRD and past medication intolerances-see allergy list. Care and ED precautions discussed. He will update us  in 2-3 weeks with his BP readings.   3. Mitral valve stenosis Recent echocardiogram showed mild to moderate mitral valve stenosis.  Asymptomatic at this time.  Plan to update echocardiogram within 6 months to 1 year or sooner if clinically indicated.  4. PAD, Hyperlipidemia LDL most  recently in 40's.  Has been followed by [facility].  Underwent angioplasty along anterior tibial of left lower extremity along with stent of left SFA. See most recent vascular studies noted above.  Continue current medication regimen.  Continue to follow-up with [facility].    5. COPD  No concerning issues related to COPD exacerbation at this time.  No medication changes at this time.  Continue follow-up with PCP.  6. ESRD  Continue hemodialysis treatments.  Discussed/recommended logging BP trends before and after dialysis.  He and his wife verbalized understanding.  7. Aortic dilatation Most recent echo showed mild dilatation of aortic root, measuring 42 mm.  Poor candidate for angiography due to history of ESRD.  Plan to update echo in 1 year or sooner if clinically indicated.  8. Morbid obesity Weight loss via diet and exercise encouraged. Discussed the  impact being overweight would have on cardiovascular risk.    Dispo: Follow-up with MD/APP in 3 months or sooner if any changes.  Signed, Almarie Crate, NP

## 2024-09-27 MED ORDER — AMLODIPINE BESYLATE 10 MG PO TABS: 10.0000 mg | ORAL_TABLET | Freq: Every day | ORAL | 1 refills | Status: AC

## 2024-09-27 NOTE — Telephone Encounter (Signed)
 Refill sent

## 2024-10-06 ENCOUNTER — Emergency Department (HOSPITAL_COMMUNITY)
Admission: EM | Admit: 2024-10-06 | Discharge: 2024-10-06 | Disposition: A | Discharge status: Home / Self Care | Attending: Emergency Medicine | Admitting: Emergency Medicine

## 2024-10-06 ENCOUNTER — Encounter (HOSPITAL_COMMUNITY): Payer: Self-pay

## 2024-10-06 ENCOUNTER — Other Ambulatory Visit: Payer: Self-pay

## 2024-10-06 DIAGNOSIS — Z794 Long term (current) use of insulin: Secondary | ICD-10-CM | POA: Diagnosis not present

## 2024-10-06 DIAGNOSIS — K0889 Other specified disorders of teeth and supporting structures: Secondary | ICD-10-CM | POA: Insufficient documentation

## 2024-10-06 DIAGNOSIS — Z992 Dependence on renal dialysis: Secondary | ICD-10-CM | POA: Diagnosis not present

## 2024-10-06 DIAGNOSIS — Z7982 Long term (current) use of aspirin: Secondary | ICD-10-CM | POA: Diagnosis not present

## 2024-10-06 DIAGNOSIS — N186 End stage renal disease: Secondary | ICD-10-CM | POA: Insufficient documentation

## 2024-10-06 MED ORDER — HYDROCODONE-ACETAMINOPHEN 5-325 MG PO TABS
1.0000 | ORAL_TABLET | Freq: Once | ORAL | Status: AC
  Administered 2024-10-06: 1 via ORAL
  Filled 2024-10-06: qty 1

## 2024-10-06 MED ORDER — AMOXICILLIN-POT CLAVULANATE 875-125 MG PO TABS: 1.0000 | ORAL_TABLET | Freq: Every day | ORAL | 0 refills | Status: AC

## 2024-10-06 MED ORDER — AMOXICILLIN-POT CLAVULANATE 875-125 MG PO TABS
1.0000 | ORAL_TABLET | Freq: Once | ORAL | Status: AC
  Administered 2024-10-06: 1 via ORAL
  Filled 2024-10-06: qty 1

## 2024-10-06 MED ORDER — HYDROCODONE-ACETAMINOPHEN 5-325 MG PO TABS: 1.0000 | ORAL_TABLET | Freq: Four times a day (QID) | ORAL | 0 refills | Status: AC | PRN

## 2024-10-06 NOTE — Progress Notes (Shared)
 [facility] - Clinic Note  10/12/2024   CHIEF COMPLAINT Patient presents for No chief complaint on file.  HISTORY OF PRESENT ILLNESS: Kelly Key is a 53 y.o. male who presents to the clinic today for:   Pt states    Referring physician: No referring provider defined for this encounter.  HISTORICAL INFORMATION:  Selected notes from the MEDICAL RECORD NUMBER Referred by Dr. Heron Salinas [facility]) for diabetic retinopathy LEE:  Ocular Hx- PMH-   CURRENT MEDICATIONS: No current outpatient medications on file. (Ophthalmic Drugs)   No current facility-administered medications for this visit. (Ophthalmic Drugs)   Current Outpatient Medications (Other)  Medication Sig   acetaminophen  (TYLENOL ) 500 MG tablet Take 1,000 mg by mouth every 6 (six) hours as needed for moderate pain or headache.   albuterol  (VENTOLIN  HFA) 108 (90 Base) MCG/ACT inhaler Inhale 2 puffs into the lungs every 4 (four) hours as needed for wheezing or shortness of breath.   allopurinol  (ZYLOPRIM ) 100 MG tablet Take 2 tablets (200 mg total) by mouth daily.   amLODipine  (NORVASC ) 10 MG tablet Take 1 tablet (10 mg total) by mouth daily.   amoxicillin -clavulanate (AUGMENTIN ) 875-125 MG tablet Take 1 tablet by mouth daily. Take after dialysis   aspirin  EC 81 MG tablet Take 1 tablet (81 mg total) by mouth daily.   atorvastatin  (LIPITOR ) 80 MG tablet Take 1 tablet (80 mg total) by mouth daily.   carvedilol  (COREG ) 25 MG tablet Take 1 tablet (25 mg total) by mouth 2 (two) times daily with a meal.   cloNIDine  (CATAPRES ) 0.3 MG tablet Take 1 tablet (0.3 mg total) by mouth 3 (three) times daily.   clopidogrel  (PLAVIX ) 75 MG tablet Take 75 mg by mouth daily.   ezetimibe  (ZETIA ) 10 MG tablet Take 10 mg by mouth every evening.   fluticasone  (FLONASE ) 50 MCG/ACT nasal spray Place 1 spray into both nostrils at bedtime as needed for allergies or rhinitis.   furosemide  (LASIX ) 80 MG tablet Take 1  tablet (80 mg total) by mouth daily.   gabapentin  (NEURONTIN ) 100 MG capsule Take 1 capsule (100 mg total) by mouth 3 (three) times daily.   glucose blood (ACCU-CHEK AVIVA PLUS) test strip Use as instructed TO CHECK BLOOD GLUCOSE THREE TIMES DAILY   HUMALOG KWIKPEN 100 UNIT/ML KwikPen Inject 8-30 Units into the skin 3 (three) times daily. Sliding scale 30 units max TID   HYDROcodone -acetaminophen  (NORCO/VICODIN) 5-325 MG tablet Take 1 tablet by mouth every 6 (six) hours as needed for severe pain (pain score 7-10).   Insulin  Pen Needle (B-D ULTRAFINE III SHORT PEN) 31G X 8 MM MISC 1 each by Does not apply route as directed.   Ipratropium-Albuterol  (COMBIVENT ) 20-100 MCG/ACT AERS respimat Inhale 1 puff into the lungs every 6 (six) hours as needed for wheezing or shortness of breath.   isosorbide  mononitrate (IMDUR ) 30 MG 24 hr tablet Take 0.5 tablets (15 mg total) by mouth daily with supper.   linaclotide  (LINZESS ) 145 MCG CAPS capsule Take 145 mcg by mouth daily as needed (constipation).   multivitamin (RENA-VIT) TABS tablet Take 1 tablet by mouth at bedtime.   pantoprazole  (PROTONIX ) 40 MG tablet Take 1 tablet (40 mg total) by mouth daily.   spironolactone  (ALDACTONE ) 25 MG tablet Take 1 tablet (25 mg total) by mouth daily.   TRESIBA  FLEXTOUCH 100 UNIT/ML FlexTouch Pen Inject 30 Units into the skin at bedtime.   VELPHORO  500 MG chewable tablet Chew 500 mg  by mouth 3 (three) times daily.   No current facility-administered medications for this visit. (Other)   REVIEW OF SYSTEMS:   ALLERGIES Allergies  Allergen Reactions   Hydrochlorothiazide Swelling   Ozempic  (0.25 Or 0.5 Mg-Dose) [Semaglutide (0.25 Or 0.5mg -Dos)] Shortness Of Breath   Hydralazine  Anxiety and Other (See Comments)    Hallucinations    Vitamin K And Related    Lisinopril Swelling   PAST MEDICAL HISTORY Past Medical History:  Diagnosis Date   Acute diastolic CHF (congestive heart failure) (HCC) 06/11/2012   EF 50-55%  [facility])   Acute gout of right knee 08/03/2019   Chronic kidney disease    COPD (chronic obstructive pulmonary disease) (HCC)    Diabetes mellitus    A1c 11.5 (06/11/2012).   Dyspnea    Gout    Hepatic steatosis 06/11/2012   Elevated LFTs   Hyperlipemia    Malignant hypertension    Microcytic anemia 06/12/2012   NSTEMI (non-ST elevated myocardial infarction) (HCC) 05/17/2021   Obesity    Thromboembolism (HCC) 02/20/2019   Past Surgical History:  Procedure Laterality Date   ABDOMINAL AORTOGRAM W/LOWER EXTREMITY N/A 12/01/2022   Procedure: ABDOMINAL AORTOGRAM W/LOWER EXTREMITY;  Surgeon: Gretta Lonni PARAS, MD;  Location: [facility] INVASIVE CV LAB;  Service: Cardiovascular;  Laterality: N/A;   AMPUTATION Left 12/03/2022   Procedure: LEFT SECOND TOE AMPUTATION;  Surgeon: Harden Jerona GAILS, MD;  Location: [facility] OR;  Service: Orthopedics;  Laterality: Left;   AV FISTULA PLACEMENT Left 11/11/2019   Procedure: ARTERIOVENOUS (AV) FISTULA CREATION LEFT BRACHIOCEPHALIC ARM;  Surgeon: Serene Gaile ORN, MD;  Location: [facility] OR;  Service: Vascular;  Laterality: Left;   COLONOSCOPY WITH PROPOFOL  N/A 09/02/2021   Procedure: COLONOSCOPY WITH PROPOFOL ;  Surgeon: Therisa Bi, MD;  Location: [facility] ENDOSCOPY;  Service: Gastroenterology;  Laterality: N/A;   IR FLUORO GUIDE CV LINE RIGHT  07/08/2019   IR REMOVAL TUN CV CATH W/O FL  03/23/2020   IR US  GUIDE VASC ACCESS RIGHT  07/08/2019   LEFT HEART CATH AND CORONARY ANGIOGRAPHY N/A 06/12/2021   Procedure: LEFT HEART CATH AND CORONARY ANGIOGRAPHY;  Surgeon: Claudene Victory ORN, MD;  Location: [facility] INVASIVE CV LAB;  Service: Cardiovascular;  Laterality: N/A;   NO PAST SURGERIES     PERIPHERAL VASCULAR BALLOON ANGIOPLASTY Left 12/01/2022   Procedure: PERIPHERAL VASCULAR BALLOON ANGIOPLASTY;  Surgeon: Gretta Lonni PARAS, MD;  Location: [facility] INVASIVE CV LAB;  Service: Cardiovascular;  Laterality: Left;  Anterior tibial   PERIPHERAL VASCULAR INTERVENTION Left 12/01/2022   Procedure:  PERIPHERAL VASCULAR INTERVENTION;  Surgeon: Gretta Lonni PARAS, MD;  Location: [facility] INVASIVE CV LAB;  Service: Cardiovascular;  Laterality: Left;  sfa   VASCULAR SURGERY     FAMILY HISTORY Family History  Problem Relation Age of Onset   Gout Mother    Asthma Mother    Diabetes Father    Heart failure Father    Diabetes Sister    Hypertension Brother    Pancreatic cancer Brother    Diabetes Sister    SOCIAL HISTORY Social History   Tobacco Use   Smoking status: Never    Passive exposure: Past   Smokeless tobacco: Never  Vaping Use   Vaping status: Never Used  Substance Use Topics   Alcohol use: Not Currently    Comment: haven't drank in over 5 years    Drug use: No       OPHTHALMIC EXAM:  Not recorded    IMAGING AND PROCEDURES  Imaging and Procedures for 10/12/2024  ASSESSMENT/PLAN:   ICD-10-CM   1. Severe nonproliferative diabetic retinopathy of both eyes with macular edema associated with type 2 diabetes mellitus (HCC)  Z88.6586     2. Encounter for long-term (current) use of insulin  (HCC)  Z79.4     3. Essential hypertension  I10     4. Hypertensive retinopathy of both eyes  H35.033     5. Cortical age-related cataract of both eyes  H25.013     6. Anxiety  F41.9       1,2. Severe Non-proliferative diabetic retinopathy, both eyes - A1c 9.1 (10.09.25), 9.5 (11.26.24); 7.4 (12.23.23) - s/p IVA OD #1 (08.28.24), #2 (09.25.24), #3 (10.23.24), #4 (11.19.25) - s/p focal laser OS (01.08.25) - exam shows scattered MAs OS -- improved post focal laser - FA (08.28.24) OD: Mild patches of capillary non perfusion peripherally, Late leaking MA, hyperfluorescence of disc, focal perifoveal hyperfluorescence leakage, no NV, OS: Mild patches of capillary non perfusion peripherally, Late leaking MA, no NV - BCVA OU 20/25 from 20/20 - OCT shows OD: Stable improvement in central IRF and SRF, scattered punctate IRHM; persistent cystic changes temporal macula, OS:  Persistent IRF/cystic changes IT macula--slightly improved, stable improvement in central cystic changes at 4 weeks - recommend IVA OD #5 today, 12.17.25 w/ f/u in 4 weeks - RBA of procedure discussed, questions answered - informed consent obtained and signed for IVA OU 11.19.25 - see procedure note  - f/u in 4 weeks -- DFE/OCT, possible injection  3,4. Hypertensive retinopathy OU - discussed importance of tight BP control - monitor   5.  Mixed Cataract OU - The symptoms of cataract, surgical options, and treatments and risks were discussed with patient. - discussed diagnosis and progression - monitor   6. Anxiety  - affecting ophthalmic examinations and treatments - may benefit from mild anxiolytic prior to treatment visits (intravitreal injections and lasers)  - recommend discussing with primary care provider  Ophthalmic Meds Ordered this visit:  No orders of the defined types were placed in this encounter.    No follow-ups on file.  There are no Patient Instructions on file for this visit.  Explained the diagnoses, plan, and follow up with the patient and they expressed understanding.  Patient expressed understanding of the importance of proper follow up care.   This document serves as a record of services personally performed by Redell JUDITHANN Hans, MD, PhD. It was created on their behalf by Almetta Pesa, an ophthalmic technician. The creation of this record is the provider's dictation and/or activities during the visit.    Electronically signed by: Almetta Pesa, OA, 10/12/2024  7:17 AM  This document serves as a record of services personally performed by Redell JUDITHANN Hans, MD, PhD. It was created on their behalf by Wanda GEANNIE Keens, COT an ophthalmic technician. The creation of this record is the provider's dictation and/or activities during the visit.    Electronically signed by:  Wanda GEANNIE Keens, COT  10/12/2024 7:17 AM  Redell JUDITHANN Hans, M.D., Ph.D. Diseases &  Surgery of the Retina and Vitreous [facility] 10/12/2024   Abbreviations: M myopia (nearsighted); A astigmatism; H hyperopia (farsighted); P presbyopia; Mrx spectacle prescription;  CTL contact lenses; OD right eye; OS left eye; OU both eyes  XT exotropia; ET esotropia; PEK punctate epithelial keratitis; PEE punctate epithelial erosions; DES dry eye syndrome; MGD meibomian gland dysfunction; ATs artificial tears; PFAT's preservative free artificial tears; NSC nuclear sclerotic cataract; PSC posterior subcapsular cataract; ERM epi-retinal  membrane; PVD posterior vitreous detachment; RD retinal detachment; DM diabetes mellitus; DR diabetic retinopathy; NPDR non-proliferative diabetic retinopathy; PDR proliferative diabetic retinopathy; CSME clinically significant macular edema; DME diabetic macular edema; dbh dot blot hemorrhages; CWS cotton wool spot; POAG primary open angle glaucoma; C/D cup-to-disc ratio; HVF humphrey visual field; GVF goldmann visual field; OCT optical coherence tomography; IOP intraocular pressure; BRVO Branch retinal vein occlusion; CRVO central retinal vein occlusion; CRAO central retinal artery occlusion; BRAO branch retinal artery occlusion; RT retinal tear; SB scleral buckle; PPV pars plana vitrectomy; VH Vitreous hemorrhage; PRP panretinal laser photocoagulation; IVK intravitreal kenalog; VMT vitreomacular traction; MH Macular hole;  NVD neovascularization of the disc; NVE neovascularization elsewhere; AREDS age related eye disease study; ARMD age related macular degeneration; POAG primary open angle glaucoma; EBMD epithelial/anterior basement membrane dystrophy; ACIOL anterior chamber intraocular lens; IOL intraocular lens; PCIOL posterior chamber intraocular lens; Phaco/IOL phacoemulsification with intraocular lens placement; PRK photorefractive keratectomy; LASIK laser assisted in situ keratomileusis; HTN hypertension; DM diabetes mellitus; COPD chronic  obstructive pulmonary disease

## 2024-10-06 NOTE — ED Triage Notes (Signed)
 Pt to ED with c/o dental pain to left lower tooth (chipped) with facial swelling that started couple days ago

## 2024-10-06 NOTE — ED Provider Notes (Signed)
 [place] EMERGENCY DEPARTMENT AT [facility]  Provider Note  CSN: [id number] Arrival date & time: 10/06/24 0018  History Chief Complaint  Patient presents with   Dental Pain    Kelly Key is a 53 y.o. male with history of ESRD on HD, here for toothache, left lower molar has been painful the last few days. No fever. No local dentist.    Home Medications Prior to Admission medications  Medication Sig Start Date End Date Taking? Authorizing Provider  amoxicillin -clavulanate (AUGMENTIN ) 875-125 MG tablet Take 1 tablet by mouth daily. Take after dialysis 10/06/24  Yes Roselyn Carlin NOVAK, MD  HYDROcodone -acetaminophen  (NORCO/VICODIN) 5-325 MG tablet Take 1 tablet by mouth every 6 (six) hours as needed for severe pain (pain score 7-10). 10/06/24  Yes Roselyn Carlin NOVAK, MD  acetaminophen  (TYLENOL ) 500 MG tablet Take 1,000 mg by mouth every 6 (six) hours as needed for moderate pain or headache.    [provider]  albuterol  (VENTOLIN  HFA) 108 (90 Base) MCG/ACT inhaler Inhale 2 puffs into the lungs every 4 (four) hours as needed for wheezing or shortness of breath. 08/01/24   Yolande Lamar BROCKS, MD  allopurinol  (ZYLOPRIM ) 100 MG tablet Take 2 tablets (200 mg total) by mouth daily. 08/04/19   Love, Sharlet RAMAN, PA-C  amLODipine  (NORVASC ) 10 MG tablet Take 1 tablet (10 mg total) by mouth daily. 09/27/24   Alvan Dorn FALCON, MD  aspirin  EC 81 MG tablet Take 1 tablet (81 mg total) by mouth daily. 05/06/23   Pearlean Manus, MD  atorvastatin  (LIPITOR ) 80 MG tablet Take 1 tablet (80 mg total) by mouth daily. 06/13/24   Miriam Norris, NP  carvedilol  (COREG ) 25 MG tablet Take 1 tablet (25 mg total) by mouth 2 (two) times daily with a meal. 05/06/23   Emokpae, Courage, MD  cloNIDine  (CATAPRES ) 0.3 MG tablet Take 1 tablet (0.3 mg total) by mouth 3 (three) times daily. 05/06/23   Pearlean Manus, MD  clopidogrel  (PLAVIX ) 75 MG tablet Take 75 mg by mouth daily. 08/26/23   [provider]  ezetimibe  (ZETIA ) 10 MG tablet Take 10 mg by mouth every evening. 01/20/19   [provider]  fluticasone  (FLONASE ) 50 MCG/ACT nasal spray Place 1 spray into both nostrils at bedtime as needed for allergies or rhinitis.    [provider]  furosemide  (LASIX ) 80 MG tablet Take 1 tablet (80 mg total) by mouth daily. 08/16/24   Laurita Pillion, MD  gabapentin  (NEURONTIN ) 100 MG capsule Take 1 capsule (100 mg total) by mouth 3 (three) times daily. 08/03/19   Love, Sharlet RAMAN, PA-C  glucose blood (ACCU-CHEK AVIVA PLUS) test strip Use as instructed TO CHECK BLOOD GLUCOSE THREE TIMES DAILY 08/15/20   Nida, Gebreselassie W, MD  HUMALOG KWIKPEN 100 UNIT/ML KwikPen Inject 8-30 Units into the skin 3 (three) times daily. Sliding scale 30 units max TID 08/19/22   [provider]  Insulin  Pen Needle (B-D ULTRAFINE III SHORT PEN) 31G X 8 MM MISC 1 each by Does not apply route as directed. 08/13/20   Nida, Gebreselassie W, MD  Ipratropium-Albuterol  (COMBIVENT ) 20-100 MCG/ACT AERS respimat Inhale 1 puff into the lungs every 6 (six) hours as needed for wheezing or shortness of breath. 09/30/23 09/26/24  Von Bellis, MD  isosorbide  mononitrate (IMDUR ) 30 MG 24 hr tablet Take 0.5 tablets (15 mg total) by mouth daily with supper. 08/05/24 11/03/24  Laurence Locus, DO  linaclotide  (LINZESS ) 145 MCG CAPS capsule Take 145 mcg by  mouth daily as needed (constipation).    [provider]  multivitamin (RENA-VIT) TABS tablet Take 1 tablet by mouth at bedtime. 06/16/24   [provider]  pantoprazole  (PROTONIX ) 40 MG tablet Take 1 tablet (40 mg total) by mouth daily. 08/09/24   Ricky Fines, MD  spironolactone  (ALDACTONE ) 25 MG tablet Take 1 tablet (25 mg total) by mouth daily. 08/09/24   Ricky Fines, MD  TRESIBA  FLEXTOUCH 100 UNIT/ML FlexTouch Pen Inject 30 Units into the skin at bedtime. 08/08/24   Ricky Fines, MD  VELPHORO  500 MG chewable tablet Chew 500 mg by mouth 3  (three) times daily. 08/13/23   [provider]     Allergies    Hydrochlorothiazide, Ozempic  (0.25 or 0.5 mg-dose) [semaglutide (0.25 or 0.5mg -dos)], Hydralazine , Vitamin k and related, and Lisinopril   Review of Systems   Review of Systems Please see HPI for pertinent positives and negatives  Physical Exam BP (!) 159/82 (BP Location: Right Arm)   Pulse 74   Temp 97.8 F (36.6 C) (Oral)   Resp 18   Ht 5' 9 (1.753 m)   Wt (!) 151.9 kg   SpO2 100%   BMI 49.44 kg/m   Physical Exam Vitals and nursing note reviewed.  Constitutional:      Appearance: Normal appearance.  HENT:     Head: Normocephalic and atraumatic.     Nose: Nose normal.     Mouth/Throat:     Mouth: Mucous membranes are moist.     Comments: Tender to palpation L lower 3rd molar, but no fluctuance or signs of deep tissue infection Eyes:     Extraocular Movements: Extraocular movements intact.     Conjunctiva/sclera: Conjunctivae normal.  Cardiovascular:     Rate and Rhythm: Normal rate.  Pulmonary:     Effort: Pulmonary effort is normal.     Breath sounds: Normal breath sounds.  Abdominal:     General: Abdomen is flat.     Palpations: Abdomen is soft.     Tenderness: There is no abdominal tenderness.  Musculoskeletal:        General: No swelling. Normal range of motion.     Cervical back: Neck supple.  Skin:    General: Skin is warm and dry.  Neurological:     General: No focal deficit present.     Mental Status: He is alert.  Psychiatric:        Mood and Affect: Mood normal.     ED Results / Procedures / Treatments   EKG None  Procedures Procedures  Medications Ordered in the ED Medications  HYDROcodone -acetaminophen  (NORCO/VICODIN) 5-325 MG per tablet 1 tablet (has no administration in time range)  amoxicillin -clavulanate (AUGMENTIN ) 875-125 MG per tablet 1 tablet (has no administration in time range)    Initial Impression and Plan  Patient here for uncomplicated  toothache, will start on Abx and pain medications. Recommend outpatient dentist for definitive care. RTED for any other concerns.   ED Course       MDM Rules/Calculators/A&P Medical Decision Making Problems Addressed: ESRD on hemodialysis [facility]): chronic illness or injury Toothache: acute illness or injury  Risk Prescription drug management.     Final Clinical Impression(s) / ED Diagnoses Final diagnoses:  Toothache  ESRD on hemodialysis [facility])    Rx / DC Orders ED Discharge Orders          Ordered    amoxicillin -clavulanate (AUGMENTIN ) 875-125 MG tablet  Daily        10/06/24 0307  HYDROcodone -acetaminophen  (NORCO/VICODIN) 5-325 MG tablet  Every 6 hours PRN        10/06/24 0307             Roselyn Carlin NOVAK, MD 10/06/24 [phone]

## 2024-10-10 ENCOUNTER — Encounter (HOSPITAL_BASED_OUTPATIENT_CLINIC_OR_DEPARTMENT_OTHER): Payer: Self-pay | Admitting: Internal Medicine

## 2024-10-10 DIAGNOSIS — R29818 Other symptoms and signs involving the nervous system: Secondary | ICD-10-CM

## 2024-10-12 ENCOUNTER — Encounter (INDEPENDENT_AMBULATORY_CARE_PROVIDER_SITE_OTHER): Admitting: Ophthalmology

## 2024-11-01 ENCOUNTER — Telehealth: Payer: Self-pay | Admitting: Nurse Practitioner

## 2024-11-01 NOTE — Telephone Encounter (Signed)
" °*  STAT* If patient is at the pharmacy, call can be transferred to refill team.   1. Which medications need to be refilled? (please list name of each medication and dose if known) spironolactone  (ALDACTONE ) 25 MG tablet   2. Which pharmacy/location (including street and city if local pharmacy) is medication to be sent to? [facility] [address], [place] - [address] Phone: [phone]  Fax: [phone]      3. Do they need a 30 day or 90 day supply? 90  Pt is out of medication "

## 2024-11-03 MED ORDER — SPIRONOLACTONE 25 MG PO TABS: 25.0000 mg | ORAL_TABLET | Freq: Every day | ORAL | 1 refills | Status: AC

## 2024-11-03 NOTE — Telephone Encounter (Signed)
 Refills has been sent to the pharmacy.

## 2024-11-08 NOTE — Progress Notes (Signed)
 "  [facility] - Clinic Note  11/16/2024   CHIEF COMPLAINT Patient presents for Retina Follow Up  HISTORY OF PRESENT ILLNESS: Kelly Key is a 54 y.o. male who presents to the clinic today for:  HPI     Retina Follow Up   In both eyes.  Severity is moderate.  Duration of 9 weeks.  Since onset it is stable.  I, the attending physician,  performed the HPI with the patient and updated documentation appropriately.        Comments   4 week Retina eval. Patient is 5 weeks late. Patient states vision seems the same. Blood sugar 154. A1c was a 10 and now is 8.3      Last edited by Valdemar Rogue, MD on 11/23/2024  1:09 PM.     Pt states    Referring physician: No referring provider defined for this encounter.  HISTORICAL INFORMATION:  Selected notes from the MEDICAL RECORD NUMBER Referred by Dr. Heron [facility]) for diabetic retinopathy LEE:  Ocular Hx- PMH-   CURRENT MEDICATIONS: No current outpatient medications on file. (Ophthalmic Drugs)   No current facility-administered medications for this visit. (Ophthalmic Drugs)   Current Outpatient Medications (Other)  Medication Sig   acetaminophen  (TYLENOL ) 500 MG tablet Take 1,000 mg by mouth every 6 (six) hours as needed for moderate pain or headache.   albuterol  (VENTOLIN  HFA) 108 (90 Base) MCG/ACT inhaler Inhale 2 puffs into the lungs every 4 (four) hours as needed for wheezing or shortness of breath.   allopurinol  (ZYLOPRIM ) 100 MG tablet Take 2 tablets (200 mg total) by mouth daily.   amLODipine  (NORVASC ) 10 MG tablet Take 1 tablet (10 mg total) by mouth daily.   amoxicillin -clavulanate (AUGMENTIN ) 875-125 MG tablet Take 1 tablet by mouth daily. Take after dialysis   aspirin  EC 81 MG tablet Take 1 tablet (81 mg total) by mouth daily.   atorvastatin  (LIPITOR ) 80 MG tablet Take 1 tablet (80 mg total) by mouth daily.   carvedilol  (COREG ) 25 MG tablet Take 1 tablet (25 mg total) by mouth 2  (two) times daily with a meal.   cloNIDine  (CATAPRES ) 0.3 MG tablet Take 1 tablet (0.3 mg total) by mouth 3 (three) times daily.   clopidogrel  (PLAVIX ) 75 MG tablet Take 75 mg by mouth daily.   ezetimibe  (ZETIA ) 10 MG tablet Take 10 mg by mouth every evening.   fluticasone  (FLONASE ) 50 MCG/ACT nasal spray Place 1 spray into both nostrils at bedtime as needed for allergies or rhinitis.   furosemide  (LASIX ) 80 MG tablet Take 1 tablet (80 mg total) by mouth daily.   gabapentin  (NEURONTIN ) 100 MG capsule Take 1 capsule (100 mg total) by mouth 3 (three) times daily.   glucose blood (ACCU-CHEK AVIVA PLUS) test strip Use as instructed TO CHECK BLOOD GLUCOSE THREE TIMES DAILY   HUMALOG KWIKPEN 100 UNIT/ML KwikPen Inject 8-30 Units into the skin 3 (three) times daily. Sliding scale 30 units max TID   HYDROcodone -acetaminophen  (NORCO/VICODIN) 5-325 MG tablet Take 1 tablet by mouth every 6 (six) hours as needed for severe pain (pain score 7-10).   Insulin  Pen Needle (B-D ULTRAFINE III SHORT PEN) 31G X 8 MM MISC 1 each by Does not apply route as directed.   linaclotide  (LINZESS ) 145 MCG CAPS capsule Take 145 mcg by mouth daily as needed (constipation).   multivitamin (RENA-VIT) TABS tablet Take 1 tablet by mouth at bedtime.   pantoprazole  (PROTONIX ) 40 MG tablet  Take 1 tablet (40 mg total) by mouth daily.   spironolactone  (ALDACTONE ) 25 MG tablet Take 1 tablet (25 mg total) by mouth daily.   TRESIBA  FLEXTOUCH 100 UNIT/ML FlexTouch Pen Inject 30 Units into the skin at bedtime.   VELPHORO  500 MG chewable tablet Chew 500 mg by mouth 3 (three) times daily.   Ipratropium-Albuterol  (COMBIVENT ) 20-100 MCG/ACT AERS respimat Inhale 1 puff into the lungs every 6 (six) hours as needed for wheezing or shortness of breath.   isosorbide  mononitrate (IMDUR ) 30 MG 24 hr tablet Take 0.5 tablets (15 mg total) by mouth daily with supper.   No current facility-administered medications for this visit. (Other)   REVIEW OF  SYSTEMS: ROS   Positive for: Gastrointestinal, Endocrine, Eyes, Respiratory Negative for: Constitutional, Neurological, Skin, Genitourinary, Musculoskeletal, HENT, Cardiovascular, Psychiatric, Allergic/Imm, Heme/Lymph Last edited by German Olam BRAVO, COT on 11/16/2024  9:07 AM.      ALLERGIES Allergies  Allergen Reactions   Hydrochlorothiazide Swelling   Ozempic  (0.25 Or 0.5 Mg-Dose) [Semaglutide (0.25 Or 0.5mg -Dos)] Shortness Of Breath   Hydralazine  Anxiety and Other (See Comments)    Hallucinations    Vitamin K And Related    Lisinopril Swelling   PAST MEDICAL HISTORY Past Medical History:  Diagnosis Date   Acute diastolic CHF (congestive heart failure) (HCC) 06/11/2012   EF 50-55% [facility])   Acute gout of right knee 08/03/2019   Chronic kidney disease    COPD (chronic obstructive pulmonary disease) (HCC)    Diabetes mellitus    A1c 11.5 (06/11/2012).   Dyspnea    Gout    Hepatic steatosis 06/11/2012   Elevated LFTs   Hyperlipemia    Malignant hypertension    Microcytic anemia 06/12/2012   NSTEMI (non-ST elevated myocardial infarction) (HCC) 05/17/2021   Obesity    Thromboembolism (HCC) 02/20/2019   Past Surgical History:  Procedure Laterality Date   ABDOMINAL AORTOGRAM W/LOWER EXTREMITY N/A 12/01/2022   Procedure: ABDOMINAL AORTOGRAM W/LOWER EXTREMITY;  Surgeon: Gretta Lonni PARAS, MD;  Location: [facility] INVASIVE CV LAB;  Service: Cardiovascular;  Laterality: N/A;   AMPUTATION Left 12/03/2022   Procedure: LEFT SECOND TOE AMPUTATION;  Surgeon: Harden Jerona GAILS, MD;  [facility]: [facility] OR;  Service: Orthopedics;  Laterality: Left;   AV FISTULA PLACEMENT Left 11/11/2019   Procedure: ARTERIOVENOUS (AV) FISTULA CREATION LEFT BRACHIOCEPHALIC ARM;  Surgeon: Serene Gaile ORN, MD;  Location: [facility] OR;  Service: Vascular;  Laterality: Left;   COLONOSCOPY WITH PROPOFOL  N/A 09/02/2021   Procedure: COLONOSCOPY WITH PROPOFOL ;  Surgeon: Therisa Bi, MD;  Location: [facility] ENDOSCOPY;  Service:  Gastroenterology;  Laterality: N/A;   IR FLUORO GUIDE CV LINE RIGHT  07/08/2019   IR REMOVAL TUN CV CATH W/O FL  03/23/2020   IR US  GUIDE VASC ACCESS RIGHT  07/08/2019   LEFT HEART CATH AND CORONARY ANGIOGRAPHY N/A 06/12/2021   Procedure: LEFT HEART CATH AND CORONARY ANGIOGRAPHY;  Surgeon: Claudene Victory ORN, MD;  Location: [facility] INVASIVE CV LAB;  Service: Cardiovascular;  Laterality: N/A;   NO PAST SURGERIES     PERIPHERAL VASCULAR BALLOON ANGIOPLASTY Left 12/01/2022   Procedure: PERIPHERAL VASCULAR BALLOON ANGIOPLASTY;  Surgeon: Gretta Lonni PARAS, MD;  Location: [facility] INVASIVE CV LAB;  Service: Cardiovascular;  Laterality: Left;  Anterior tibial   PERIPHERAL VASCULAR INTERVENTION Left 12/01/2022   Procedure: PERIPHERAL VASCULAR INTERVENTION;  Surgeon: Gretta Lonni PARAS, MD;  Location: [facility] INVASIVE CV LAB;  Service: Cardiovascular;  Laterality: Left;  sfa   VASCULAR SURGERY     FAMILY HISTORY Family  History  Problem Relation Age of Onset   Gout Mother    Asthma Mother    Diabetes Father    Heart failure Father    Diabetes Sister    Hypertension Brother    Pancreatic cancer Brother    Diabetes Sister    SOCIAL HISTORY Social History   Tobacco Use   Smoking status: Never    Passive exposure: Past   Smokeless tobacco: Never  Vaping Use   Vaping status: Never Used  Substance Use Topics   Alcohol use: Not Currently    Comment: haven't drank in over 5 years    Drug use: No       OPHTHALMIC EXAM:  Base Eye Exam     Visual Acuity (Snellen - Linear)       Right Left   Dist [place] 20/30 -3 20/20 -2   Dist ph [place] 20/30          Tonometry (Tonopen, 9:14 AM)       Right Left   Pressure 15 12         Pupils       Dark Light Shape React APD   Right 3 2 Round Brisk None   Left 3 2 Round Brisk None         Visual Fields (Counting fingers)       Left Right    Full Full         Extraocular Movement       Right Left    Full, Ortho Full, Ortho         Neuro/Psych      Oriented x3: Yes   Mood/Affect: Normal         Dilation     Both eyes: 1.0% Mydriacyl, 2.5% Phenylephrine  @ 9:14 AM           Slit Lamp and Fundus Exam     External Exam       Right Left   External Normal Normal         Slit Lamp Exam       Right Left   Lids/Lashes Dermatochalasis, mild MGD Dermatochalasis - upper lid   Conjunctiva/Sclera Mild Melanosis Mild Melanosis   Cornea Arcus arcus, tear film debris   Anterior Chamber Deep and clear Deep and clear   Iris No NVI, Round and dilated No NVI, Round and dilated   Lens 2+ Nuclear sclerosis, 2-3+ Cortical cataract 2+ Nuclear sclerosis, 2-3+ Cortical cataract   Anterior Vitreous Vitreous syneresis Vitreous syneresis         Fundus Exam       Right Left   Disc Pink and sharp, mild PPA Pink and sharp, mild PPA   C/D Ratio 0.6 0.5   Macula Flat, good foveal reflex, central cystic changes--improved, focal cluster of DBH just temporal to fovea, scattered MA/DBH greatest inferotemporal macula Flat, Blunted foveal reflex, central cystic changes -- improved, scattered MA/DBH, good focal laser changes, scattered non central cystic changes greatest IT mac-- slightly improved   Vessels attenuated, Tortuous, A/V crossing changes attenuated, Tortuous   Periphery Attached, scattered MA/DBH Attached, scattered MA/DBH--greatest posteriorly           IMAGING AND PROCEDURES  Imaging and Procedures for 11/16/2024  OCT, Retina - OU - Both Eyes       Right Eye Quality was good. Central Foveal Thickness: 267. Progression has improved. Findings include normal foveal contour, no IRF, no SRF, intraretinal hyper-reflective material, vitreomacular adhesion (Stable improvement in central  IRF and SRF, scattered punctate IRHM; interval improvement in cystic changes temporal macula).   Left Eye Quality was good. Central Foveal Thickness: 282. Progression has improved. Findings include normal foveal contour, no SRF, intraretinal  hyper-reflective material, intraretinal fluid, vitreomacular adhesion (Persistent IRF/cystic changes IT macula--slightly improved, stable improvement in central cystic changes).   Notes *Images captured and stored on drive  Diagnosis / Impression:  OD: Stable improvement in central IRF and SRF, scattered punctate IRHM; interval improvement in cystic changes temporal macula OS: Persistent IRF/cystic changes IT macula--slightly improved, stable improvement in central cystic changes  Clinical management:  See below  Abbreviations: NFP - Normal foveal profile. CME - cystoid macular edema. PED - pigment epithelial detachment. IRF - intraretinal fluid. SRF - subretinal fluid. EZ - ellipsoid zone. ERM - epiretinal membrane. ORA - outer retinal atrophy. ORT - outer retinal tubulation. SRHM - subretinal hyper-reflective material. IRHM - intraretinal hyper-reflective material            ASSESSMENT/PLAN:   ICD-10-CM   1. Severe nonproliferative diabetic retinopathy of both eyes with macular edema associated with type 2 diabetes mellitus (HCC)  E11.3413 OCT, Retina - OU - Both Eyes    2. Encounter for long-term (current) use of insulin  (HCC)  Z79.4     3. Essential hypertension  I10     4. Hypertensive retinopathy of both eyes  H35.033     5. Cortical age-related cataract of both eyes  H25.013     6. Anxiety  F41.9      1,2. Severe Non-proliferative diabetic retinopathy, both eyes - A1c 8.3 pt reported-most recent check, 9.1 (10.09.25), 9.5 (11.26.24); 7.4 (12.23.23) **delayed f/u 4wks instead of 9wks (11.19.25-01.21.26)** - s/p IVA OD #1 (08.28.24), #2 (09.25.24), #3 (10.23.24), #4 (11.19.25) - s/p focal laser OS (01.08.25) - exam shows scattered MAs OS -- improved post focal laser - FA (08.28.24) OD: Mild patches of capillary non perfusion peripherally, Late leaking MA, hyperfluorescence of disc, focal perifoveal hyperfluorescence leakage, no NV, OS: Mild patches of capillary non  perfusion peripherally, Late leaking MA, no NV - BCVA OU 20/30 from 20/25 - OCT shows OD: Stable improvement in central IRF and SRF, scattered punctate IRHM; interval improvement in cystic changes temporal macula, OS: Persistent IRF/cystic changes IT macula--slightly improved, stable improvement in central cystic changes at 9 weeks - recommend holding IVA OD today, 01.21.26 w/ f/u in 6 weeks - pt in agreement - RBA of procedure discussed, questions answered - informed consent obtained and signed for IVA OU 11.19.25 - f/u in 6 weeks -- DFE/OCT, possible injection, repeat FA transit OD next visit  3,4. Hypertensive retinopathy OU - discussed importance of tight BP control - monitor   5.  Mixed Cataract OU - The symptoms of cataract, surgical options, and treatments and risks were discussed with patient. - discussed diagnosis and progression - monitor   6. Anxiety  - affecting ophthalmic examinations and treatments - may benefit from mild anxiolytic prior to treatment visits (intravitreal injections and lasers)  - recommend discussing with primary care provider  Ophthalmic Meds Ordered this visit:  No orders of the defined types were placed in this encounter.    Return in about 6 weeks (around 12/28/2024) for NPDR OU, DFE, OCT, repeat FA transit OD, Possible Injxn.  There are no Patient Instructions on file for this visit.  Explained the diagnoses, plan, and follow up with the patient and they expressed understanding.  Patient expressed understanding of the importance of proper follow up care.  This document serves as a record of services personally performed by Redell JUDITHANN Hans, MD, PhD. It was created on their behalf by Almetta Pesa, an ophthalmic technician. The creation of this record is the provider's dictation and/or activities during the visit.    Electronically signed by: Almetta Pesa, OA, 11/23/24  1:11 PM  This document serves as a record of services personally  performed by Redell JUDITHANN Hans, MD, PhD. It was created on their behalf by Wanda GEANNIE Keens, COT an ophthalmic technician. The creation of this record is the provider's dictation and/or activities during the visit.    Electronically signed by:  Wanda GEANNIE Keens, COT  11/23/24 1:11 PM  Redell JUDITHANN Hans, M.D., Ph.D. Diseases & Surgery of the Retina and Vitreous [facility] 11/16/2024  I have reviewed the above documentation for accuracy and completeness, and I agree with the above. Redell JUDITHANN Hans, M.D., Ph.D. 11/23/24 1:11 PM   Abbreviations: M myopia (nearsighted); A astigmatism; H hyperopia (farsighted); P presbyopia; Mrx spectacle prescription;  CTL contact lenses; OD right eye; OS left eye; OU both eyes  XT exotropia; ET esotropia; PEK punctate epithelial keratitis; PEE punctate epithelial erosions; DES dry eye syndrome; MGD meibomian gland dysfunction; ATs artificial tears; PFAT's preservative free artificial tears; NSC nuclear sclerotic cataract; PSC posterior subcapsular cataract; ERM epi-retinal membrane; PVD posterior vitreous detachment; RD retinal detachment; DM diabetes mellitus; DR diabetic retinopathy; NPDR non-proliferative diabetic retinopathy; PDR proliferative diabetic retinopathy; CSME clinically significant macular edema; DME diabetic macular edema; dbh dot blot hemorrhages; CWS cotton wool spot; POAG primary open angle glaucoma; C/D cup-to-disc ratio; HVF humphrey visual field; GVF goldmann visual field; OCT optical coherence tomography; IOP intraocular pressure; BRVO Branch retinal vein occlusion; CRVO central retinal vein occlusion; CRAO central retinal artery occlusion; BRAO branch retinal artery occlusion; RT retinal tear; SB scleral buckle; PPV pars plana vitrectomy; VH Vitreous hemorrhage; PRP panretinal laser photocoagulation; IVK intravitreal kenalog; VMT vitreomacular traction; MH Macular hole;  NVD neovascularization of the disc; NVE  neovascularization elsewhere; AREDS age related eye disease study; ARMD age related macular degeneration; POAG primary open angle glaucoma; EBMD epithelial/anterior basement membrane dystrophy; ACIOL anterior chamber intraocular lens; IOL intraocular lens; PCIOL posterior chamber intraocular lens; Phaco/IOL phacoemulsification with intraocular lens placement; PRK photorefractive keratectomy; LASIK laser assisted in situ keratomileusis; HTN hypertension; DM diabetes mellitus; COPD chronic obstructive pulmonary disease  "

## 2024-11-16 ENCOUNTER — Encounter (INDEPENDENT_AMBULATORY_CARE_PROVIDER_SITE_OTHER): Payer: Self-pay | Admitting: Ophthalmology

## 2024-11-16 ENCOUNTER — Ambulatory Visit (INDEPENDENT_AMBULATORY_CARE_PROVIDER_SITE_OTHER): Admitting: Ophthalmology

## 2024-11-16 DIAGNOSIS — H25013 Cortical age-related cataract, bilateral: Secondary | ICD-10-CM | POA: Diagnosis not present

## 2024-11-16 DIAGNOSIS — Z794 Long term (current) use of insulin: Secondary | ICD-10-CM | POA: Diagnosis not present

## 2024-11-16 DIAGNOSIS — F419 Anxiety disorder, unspecified: Secondary | ICD-10-CM

## 2024-11-16 DIAGNOSIS — H35033 Hypertensive retinopathy, bilateral: Secondary | ICD-10-CM

## 2024-11-16 DIAGNOSIS — I10 Essential (primary) hypertension: Secondary | ICD-10-CM

## 2024-11-16 DIAGNOSIS — E113413 Type 2 diabetes mellitus with severe nonproliferative diabetic retinopathy with macular edema, bilateral: Secondary | ICD-10-CM

## 2024-11-23 ENCOUNTER — Encounter (INDEPENDENT_AMBULATORY_CARE_PROVIDER_SITE_OTHER): Payer: Self-pay | Admitting: Ophthalmology

## 2024-11-30 ENCOUNTER — Ambulatory Visit: Admitting: Vascular Surgery

## 2024-12-21 ENCOUNTER — Ambulatory Visit: Admitting: Vascular Surgery

## 2024-12-26 ENCOUNTER — Ambulatory Visit: Admitting: Nurse Practitioner

## 2024-12-28 ENCOUNTER — Encounter (INDEPENDENT_AMBULATORY_CARE_PROVIDER_SITE_OTHER): Admitting: Ophthalmology
